# Patient Record
Sex: Female | Born: 1950
Health system: Southern US, Community
[De-identification: ages and names within clinical notes are randomized; demographics above are authoritative.]

## PROBLEM LIST (undated history)

## (undated) DIAGNOSIS — E785 Hyperlipidemia, unspecified: Secondary | ICD-10-CM

## (undated) DIAGNOSIS — E875 Hyperkalemia: Secondary | ICD-10-CM

## (undated) DIAGNOSIS — F32A Depression, unspecified: Secondary | ICD-10-CM

## (undated) DIAGNOSIS — N951 Menopausal and female climacteric states: Secondary | ICD-10-CM

## (undated) DIAGNOSIS — I5042 Chronic combined systolic (congestive) and diastolic (congestive) heart failure: Secondary | ICD-10-CM

## (undated) DIAGNOSIS — I10 Essential (primary) hypertension: Secondary | ICD-10-CM

## (undated) DIAGNOSIS — K635 Polyp of colon: Secondary | ICD-10-CM

## (undated) DIAGNOSIS — E669 Obesity, unspecified: Secondary | ICD-10-CM

## (undated) DIAGNOSIS — Z1159 Encounter for screening for other viral diseases: Secondary | ICD-10-CM

## (undated) DIAGNOSIS — E0590 Thyrotoxicosis, unspecified without thyrotoxic crisis or storm: Secondary | ICD-10-CM

## (undated) DIAGNOSIS — G4730 Sleep apnea, unspecified: Secondary | ICD-10-CM

## (undated) DIAGNOSIS — Z9981 Dependence on supplemental oxygen: Secondary | ICD-10-CM

## (undated) DIAGNOSIS — N170 Acute kidney failure with tubular necrosis: Secondary | ICD-10-CM

## (undated) DIAGNOSIS — Z4502 Encounter for adjustment and management of automatic implantable cardiac defibrillator: Secondary | ICD-10-CM

## (undated) DIAGNOSIS — R21 Rash and other nonspecific skin eruption: Secondary | ICD-10-CM

## (undated) DIAGNOSIS — I428 Other cardiomyopathies: Secondary | ICD-10-CM

## (undated) DIAGNOSIS — R748 Abnormal levels of other serum enzymes: Secondary | ICD-10-CM

## (undated) DIAGNOSIS — R0602 Shortness of breath: Secondary | ICD-10-CM

## (undated) DIAGNOSIS — E119 Type 2 diabetes mellitus without complications: Secondary | ICD-10-CM

## (undated) DIAGNOSIS — Z0000 Encounter for general adult medical examination without abnormal findings: Secondary | ICD-10-CM

## (undated) DIAGNOSIS — Z95810 Presence of automatic (implantable) cardiac defibrillator: Secondary | ICD-10-CM

## (undated) DIAGNOSIS — Z5189 Encounter for other specified aftercare: Secondary | ICD-10-CM

## (undated) DIAGNOSIS — I509 Heart failure, unspecified: Secondary | ICD-10-CM

## (undated) DIAGNOSIS — Z9889 Other specified postprocedural states: Secondary | ICD-10-CM

## (undated) DIAGNOSIS — L97909 Non-pressure chronic ulcer of unspecified part of unspecified lower leg with unspecified severity: Secondary | ICD-10-CM

## (undated) DIAGNOSIS — L309 Dermatitis, unspecified: Secondary | ICD-10-CM

## (undated) DIAGNOSIS — E1169 Type 2 diabetes mellitus with other specified complication: Secondary | ICD-10-CM

## (undated) DIAGNOSIS — J449 Chronic obstructive pulmonary disease, unspecified: Secondary | ICD-10-CM

## (undated) DIAGNOSIS — F329 Major depressive disorder, single episode, unspecified: Secondary | ICD-10-CM

## (undated) HISTORY — PX: TUBAL LIGATION: SHX77

## (undated) HISTORY — DX: Type 2 diabetes mellitus without complications: E11.9

## (undated) HISTORY — PX: CARDIAC DEFIBRILLATOR PLACEMENT: SHX171

## (undated) HISTORY — DX: Essential (primary) hypertension: I10

## (undated) HISTORY — PX: INSERT / REPLACE / REMOVE PACEMAKER: SUR710

## (undated) HISTORY — DX: Hyperlipidemia, unspecified: E78.5

## (undated) HISTORY — PX: CARDIAC CATHETERIZATION: SHX172

## (undated) HISTORY — DX: Thyrotoxicosis, unspecified without thyrotoxic crisis or storm: E05.90

## (undated) HISTORY — DX: Hyperkalemia: E87.5

## (undated) HISTORY — PX: HERNIA REPAIR: SHX51

## (undated) HISTORY — DX: Heart failure, unspecified: I50.9

## (undated) HISTORY — PX: ABDOMINAL HYSTERECTOMY: SHX81

## (undated) HISTORY — PX: HYSTEROSCOPY: SHX211

## (undated) HISTORY — DX: Other specified postprocedural states: Z98.890

## (undated) HISTORY — DX: Dermatitis, unspecified: L30.9

## (undated) HISTORY — DX: Major depressive disorder, single episode, unspecified: F32.9

## (undated) HISTORY — DX: Chronic combined systolic (congestive) and diastolic (congestive) heart failure: I50.42

## (undated) HISTORY — DX: Polyp of colon: K63.5

## (undated) HISTORY — DX: Presence of automatic (implantable) cardiac defibrillator: Z95.810

## (undated) HISTORY — DX: Depression, unspecified: F32.A

## (undated) HISTORY — DX: Menopausal and female climacteric states: N95.1

## (undated) HISTORY — DX: Sleep apnea, unspecified: G47.30

## (undated) HISTORY — DX: Rash and other nonspecific skin eruption: R21

## (undated) HISTORY — DX: Chronic obstructive pulmonary disease, unspecified: J44.9

## (undated) HISTORY — DX: Obesity, unspecified: E66.9

## (undated) HISTORY — DX: Encounter for general adult medical examination without abnormal findings: Z00.00

## (undated) HISTORY — DX: Abnormal levels of other serum enzymes: R74.8

## (undated) HISTORY — DX: Encounter for adjustment and management of automatic implantable cardiac defibrillator: Z45.02

## (undated) HISTORY — PX: TONSILLECTOMY: SUR1361

## (undated) HISTORY — PX: CATARACT EXTRACTION: SUR2

## (undated) HISTORY — DX: Type 2 diabetes mellitus with other specified complication: E11.69

## (undated) HISTORY — DX: Non-pressure chronic ulcer of unspecified part of unspecified lower leg with unspecified severity: L97.909

## (undated) HISTORY — DX: Shortness of breath: R06.02

## (undated) HISTORY — DX: Other cardiomyopathies: I42.8

## (undated) HISTORY — PX: EYE SURGERY: SHX253

## (undated) LAB — HM DIABETES EYE EXAM

---
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
## 1998-02-11 ENCOUNTER — Ambulatory Visit (HOSPITAL_COMMUNITY): Admission: RE | Admit: 1998-02-11 | Discharge: 1998-02-11 | Payer: Self-pay | Admitting: Surgery

## 1999-10-08 ENCOUNTER — Encounter: Payer: Self-pay | Admitting: Family Medicine

## 1999-10-08 ENCOUNTER — Encounter: Admission: RE | Admit: 1999-10-08 | Discharge: 1999-10-08 | Payer: Self-pay | Admitting: Family Medicine

## 2000-04-10 ENCOUNTER — Encounter (INDEPENDENT_AMBULATORY_CARE_PROVIDER_SITE_OTHER): Payer: Self-pay

## 2000-04-10 ENCOUNTER — Other Ambulatory Visit: Admission: RE | Admit: 2000-04-10 | Discharge: 2000-04-10 | Payer: Self-pay | Admitting: Obstetrics and Gynecology

## 2000-06-23 ENCOUNTER — Other Ambulatory Visit: Admission: RE | Admit: 2000-06-23 | Discharge: 2000-06-23 | Payer: Self-pay | Admitting: Obstetrics and Gynecology

## 2000-06-29 ENCOUNTER — Encounter: Payer: Self-pay | Admitting: Obstetrics and Gynecology

## 2000-07-05 ENCOUNTER — Inpatient Hospital Stay (HOSPITAL_COMMUNITY): Admission: RE | Admit: 2000-07-05 | Discharge: 2000-07-08 | Payer: Self-pay | Admitting: Obstetrics and Gynecology

## 2000-07-05 ENCOUNTER — Encounter (INDEPENDENT_AMBULATORY_CARE_PROVIDER_SITE_OTHER): Payer: Self-pay | Admitting: Specialist

## 2000-07-08 ENCOUNTER — Encounter: Payer: Self-pay | Admitting: Obstetrics and Gynecology

## 2000-11-17 ENCOUNTER — Encounter: Payer: Self-pay | Admitting: Family Medicine

## 2000-11-17 ENCOUNTER — Encounter: Admission: RE | Admit: 2000-11-17 | Discharge: 2000-11-17 | Payer: Self-pay | Admitting: Family Medicine

## 2002-07-09 ENCOUNTER — Emergency Department (HOSPITAL_COMMUNITY): Admission: EM | Admit: 2002-07-09 | Discharge: 2002-07-09 | Payer: Self-pay | Admitting: Emergency Medicine

## 2002-09-09 ENCOUNTER — Emergency Department (HOSPITAL_COMMUNITY): Admission: EM | Admit: 2002-09-09 | Discharge: 2002-09-09 | Payer: Self-pay | Admitting: Emergency Medicine

## 2002-09-17 ENCOUNTER — Encounter: Payer: Self-pay | Admitting: Cardiovascular Disease

## 2002-09-17 ENCOUNTER — Inpatient Hospital Stay (HOSPITAL_COMMUNITY): Admission: EM | Admit: 2002-09-17 | Discharge: 2002-09-19 | Payer: Self-pay | Admitting: Emergency Medicine

## 2002-09-18 ENCOUNTER — Encounter (INDEPENDENT_AMBULATORY_CARE_PROVIDER_SITE_OTHER): Payer: Self-pay | Admitting: Cardiology

## 2003-08-22 ENCOUNTER — Encounter: Admission: RE | Admit: 2003-08-22 | Discharge: 2003-11-20 | Payer: Self-pay | Admitting: Family Medicine

## 2003-10-09 ENCOUNTER — Encounter: Admission: RE | Admit: 2003-10-09 | Discharge: 2003-10-09 | Payer: Self-pay | Admitting: Family Medicine

## 2003-12-30 ENCOUNTER — Encounter (INDEPENDENT_AMBULATORY_CARE_PROVIDER_SITE_OTHER): Payer: Self-pay | Admitting: Cardiology

## 2003-12-30 ENCOUNTER — Inpatient Hospital Stay (HOSPITAL_COMMUNITY): Admission: EM | Admit: 2003-12-30 | Discharge: 2004-01-02 | Payer: Self-pay | Admitting: Emergency Medicine

## 2006-08-02 ENCOUNTER — Inpatient Hospital Stay (HOSPITAL_COMMUNITY): Admission: EM | Admit: 2006-08-02 | Discharge: 2006-08-04 | Payer: Self-pay | Admitting: Emergency Medicine

## 2006-08-28 ENCOUNTER — Ambulatory Visit: Payer: Self-pay | Admitting: Family Medicine

## 2006-09-06 ENCOUNTER — Ambulatory Visit: Payer: Self-pay | Admitting: Cardiology

## 2006-09-15 ENCOUNTER — Encounter: Payer: Self-pay | Admitting: Cardiology

## 2006-09-15 ENCOUNTER — Ambulatory Visit: Payer: Self-pay | Admitting: Cardiology

## 2006-09-15 ENCOUNTER — Ambulatory Visit: Payer: Self-pay

## 2007-02-20 ENCOUNTER — Ambulatory Visit: Payer: Self-pay | Admitting: Cardiology

## 2007-03-06 ENCOUNTER — Ambulatory Visit: Payer: Self-pay

## 2007-03-06 LAB — CONVERTED CEMR LAB
ALT: 26 units/L (ref 0–40)
AST: 23 units/L (ref 0–37)
Albumin: 3.9 g/dL (ref 3.5–5.2)
Alkaline Phosphatase: 84 units/L (ref 39–117)
BUN: 11 mg/dL (ref 6–23)
Bilirubin, Direct: 0.1 mg/dL (ref 0.0–0.3)
CO2: 31 meq/L (ref 19–32)
Calcium: 9.3 mg/dL (ref 8.4–10.5)
Chloride: 103 meq/L (ref 96–112)
Cholesterol: 211 mg/dL (ref 0–200)
Creatinine, Ser: 0.8 mg/dL (ref 0.4–1.2)
Direct LDL: 122.3 mg/dL
GFR calc Af Amer: 96 mL/min
GFR calc non Af Amer: 80 mL/min
Glucose, Bld: 218 mg/dL — ABNORMAL HIGH (ref 70–99)
HDL: 66.8 mg/dL (ref >39.0)
Potassium: 3.7 meq/L (ref 3.5–5.1)
Sodium: 140 meq/L (ref 135–145)
Total Bilirubin: 1.1 mg/dL (ref 0.3–1.2)
Total CHOL/HDL Ratio: 3.2
Total Protein: 6.9 g/dL (ref 6.0–8.3)
Triglycerides: 163 mg/dL — ABNORMAL HIGH (ref 0–149)
VLDL: 33 mg/dL (ref 0–40)

## 2007-04-23 ENCOUNTER — Ambulatory Visit: Payer: Self-pay | Admitting: Internal Medicine

## 2007-04-27 ENCOUNTER — Ambulatory Visit: Payer: Self-pay | Admitting: Internal Medicine

## 2007-04-27 LAB — CONVERTED CEMR LAB
BUN: 11 mg/dL (ref 6–23)
Basophils Absolute: 0.1 10*3/uL (ref 0.0–0.1)
Basophils Relative: 0.9 % (ref 0.0–1.0)
CO2: 30 meq/L (ref 19–32)
Calcium: 9.1 mg/dL (ref 8.4–10.5)
Chloride: 103 meq/L (ref 96–112)
Creatinine, Ser: 0.8 mg/dL (ref 0.4–1.2)
Eosinophils Absolute: 0.2 10*3/uL (ref 0.0–0.6)
Eosinophils Relative: 2 % (ref 0.0–5.0)
GFR calc Af Amer: 96 mL/min
GFR calc non Af Amer: 80 mL/min
Glucose, Bld: 249 mg/dL — ABNORMAL HIGH (ref 70–99)
HCT: 37.6 % (ref 36.0–46.0)
Hemoglobin: 12.6 g/dL (ref 12.0–15.0)
INR: 1 (ref 0.9–2.0)
Lymphocytes Relative: 30.9 % (ref 12.0–46.0)
MCHC: 33.6 g/dL (ref 30.0–36.0)
MCV: 82.3 fL (ref 78.0–100.0)
Monocytes Absolute: 0.5 10*3/uL (ref 0.2–0.7)
Monocytes Relative: 6.3 % (ref 3.0–11.0)
Neutro Abs: 4.5 10*3/uL (ref 1.4–7.7)
Neutrophils Relative %: 59.9 % (ref 43.0–77.0)
Platelets: 249 10*3/uL (ref 150–400)
Potassium: 3.6 meq/L (ref 3.5–5.1)
Prothrombin Time: 11.8 s (ref 10.0–14.0)
RBC: 4.57 M/uL (ref 3.87–5.11)
RDW: 14.9 % — ABNORMAL HIGH (ref 11.5–14.6)
Sodium: 139 meq/L (ref 135–145)
WBC: 7.7 10*3/uL (ref 4.5–10.5)
aPTT: 32.7 s (ref 26.5–36.5)

## 2007-05-04 ENCOUNTER — Ambulatory Visit: Payer: Self-pay | Admitting: Internal Medicine

## 2007-05-04 ENCOUNTER — Inpatient Hospital Stay (HOSPITAL_COMMUNITY): Admission: RE | Admit: 2007-05-04 | Discharge: 2007-05-05 | Payer: Self-pay | Admitting: Internal Medicine

## 2007-05-04 DIAGNOSIS — Z95810 Presence of automatic (implantable) cardiac defibrillator: Secondary | ICD-10-CM | POA: Insufficient documentation

## 2007-05-04 HISTORY — DX: Presence of automatic (implantable) cardiac defibrillator: Z95.810

## 2007-05-04 HISTORY — PX: CARDIAC DEFIBRILLATOR PLACEMENT: SHX171

## 2007-05-16 ENCOUNTER — Ambulatory Visit: Payer: Self-pay

## 2007-05-22 ENCOUNTER — Ambulatory Visit: Payer: Self-pay | Admitting: *Deleted

## 2007-05-22 ENCOUNTER — Encounter (INDEPENDENT_AMBULATORY_CARE_PROVIDER_SITE_OTHER): Payer: Self-pay | Admitting: Nurse Practitioner

## 2007-05-22 ENCOUNTER — Ambulatory Visit: Payer: Self-pay | Admitting: Internal Medicine

## 2007-05-22 LAB — CONVERTED CEMR LAB
Glucose, Bld: 261 mg/dL
Hgb A1c MFr Bld: 11.6 %

## 2007-05-23 ENCOUNTER — Encounter (INDEPENDENT_AMBULATORY_CARE_PROVIDER_SITE_OTHER): Payer: Self-pay | Admitting: Internal Medicine

## 2007-05-23 LAB — CONVERTED CEMR LAB
ALT: 20 units/L (ref 0–35)
AST: 16 units/L (ref 0–37)
Albumin: 4.3 g/dL (ref 3.5–5.2)
Alkaline Phosphatase: 102 units/L (ref 39–117)
BUN: 12 mg/dL (ref 6–23)
Basophils Absolute: 0 10*3/uL (ref 0.0–0.1)
Basophils Relative: 1 % (ref 0–1)
CO2: 21 meq/L (ref 19–32)
Calcium: 8.9 mg/dL (ref 8.4–10.5)
Chloride: 104 meq/L (ref 96–112)
Creatinine, Ser: 0.7 mg/dL (ref 0.40–1.20)
Eosinophils Absolute: 0.2 10*3/uL (ref 0.0–0.7)
Eosinophils Relative: 3 % (ref 0–5)
Glucose, Bld: 242 mg/dL — ABNORMAL HIGH (ref 70–99)
HCT: 42.2 % (ref 36.0–46.0)
Hemoglobin: 13.4 g/dL (ref 12.0–15.0)
Lymphocytes Relative: 31 % (ref 12–46)
Lymphs Abs: 2.2 10*3/uL (ref 0.7–3.3)
MCHC: 31.8 g/dL (ref 30.0–36.0)
MCV: 83.7 fL (ref 78.0–100.0)
Monocytes Absolute: 0.4 10*3/uL (ref 0.2–0.7)
Monocytes Relative: 6 % (ref 3–11)
Neutro Abs: 4.2 10*3/uL (ref 1.7–7.7)
Neutrophils Relative %: 59 % (ref 43–77)
Platelets: 196 10*3/uL (ref 150–400)
Potassium: 4.2 meq/L (ref 3.5–5.3)
RBC / HPF: NONE SEEN (ref <3)
RBC: 5.04 M/uL (ref 3.87–5.11)
RDW: 15.6 % — ABNORMAL HIGH (ref 11.5–14.0)
Sodium: 140 meq/L (ref 135–145)
TSH: 1.061 microintl units/mL (ref 0.350–5.50)
Total Bilirubin: 0.6 mg/dL (ref 0.3–1.2)
Total Protein: 6.7 g/dL (ref 6.0–8.3)
WBC, UA: NONE SEEN cells/hpf (ref <3)
WBC: 7.2 10*3/uL (ref 4.0–10.5)

## 2007-05-25 ENCOUNTER — Encounter: Payer: Self-pay | Admitting: Nurse Practitioner

## 2007-05-25 DIAGNOSIS — E1169 Type 2 diabetes mellitus with other specified complication: Secondary | ICD-10-CM

## 2007-05-25 DIAGNOSIS — E785 Hyperlipidemia, unspecified: Secondary | ICD-10-CM | POA: Insufficient documentation

## 2007-05-25 DIAGNOSIS — L97909 Non-pressure chronic ulcer of unspecified part of unspecified lower leg with unspecified severity: Secondary | ICD-10-CM | POA: Insufficient documentation

## 2007-05-25 DIAGNOSIS — I10 Essential (primary) hypertension: Secondary | ICD-10-CM | POA: Insufficient documentation

## 2007-05-25 DIAGNOSIS — I5042 Chronic combined systolic (congestive) and diastolic (congestive) heart failure: Secondary | ICD-10-CM | POA: Insufficient documentation

## 2007-05-25 HISTORY — DX: Type 2 diabetes mellitus with other specified complication: E11.69

## 2007-05-25 HISTORY — DX: Type 2 diabetes mellitus with other specified complication: E78.5

## 2007-07-10 ENCOUNTER — Encounter (INDEPENDENT_AMBULATORY_CARE_PROVIDER_SITE_OTHER): Payer: Self-pay | Admitting: Internal Medicine

## 2007-07-23 ENCOUNTER — Telehealth (INDEPENDENT_AMBULATORY_CARE_PROVIDER_SITE_OTHER): Payer: Self-pay | Admitting: *Deleted

## 2007-08-01 ENCOUNTER — Ambulatory Visit: Payer: Self-pay | Admitting: Nurse Practitioner

## 2007-08-01 DIAGNOSIS — D1720 Benign lipomatous neoplasm of skin and subcutaneous tissue of unspecified limb: Secondary | ICD-10-CM | POA: Insufficient documentation

## 2007-08-01 LAB — CONVERTED CEMR LAB: Blood Glucose, Fingerstick: 380

## 2007-08-08 ENCOUNTER — Encounter (INDEPENDENT_AMBULATORY_CARE_PROVIDER_SITE_OTHER): Payer: Self-pay | Admitting: Nurse Practitioner

## 2007-08-09 ENCOUNTER — Encounter (INDEPENDENT_AMBULATORY_CARE_PROVIDER_SITE_OTHER): Payer: Self-pay | Admitting: Nurse Practitioner

## 2007-08-09 ENCOUNTER — Ambulatory Visit: Payer: Self-pay | Admitting: Cardiology

## 2007-08-09 LAB — CONVERTED CEMR LAB
ALT: 23 units/L (ref 0–35)
AST: 23 units/L (ref 0–37)
Albumin: 4 g/dL (ref 3.5–5.2)
Alkaline Phosphatase: 93 units/L (ref 39–117)
BUN: 9 mg/dL (ref 6–23)
Bilirubin, Direct: 0.1 mg/dL (ref 0.0–0.3)
CO2: 29 meq/L (ref 19–32)
Calcium: 9.3 mg/dL (ref 8.4–10.5)
Chloride: 103 meq/L (ref 96–112)
Cholesterol: 196 mg/dL (ref 0–200)
Creatinine, Ser: 1 mg/dL (ref 0.4–1.2)
GFR calc Af Amer: 75 mL/min
GFR calc non Af Amer: 62 mL/min
Glucose, Bld: 234 mg/dL — ABNORMAL HIGH (ref 70–99)
HDL: 70.4 mg/dL (ref >39.0)
LDL Cholesterol: 95 mg/dL (ref 0–99)
Potassium: 3.8 meq/L (ref 3.5–5.1)
Sodium: 142 meq/L (ref 135–145)
Total Bilirubin: 0.9 mg/dL (ref 0.3–1.2)
Total CHOL/HDL Ratio: 2.8
Total Protein: 6.8 g/dL (ref 6.0–8.3)
Triglycerides: 151 mg/dL — ABNORMAL HIGH (ref 0–149)
VLDL: 30 mg/dL (ref 0–40)

## 2007-08-21 ENCOUNTER — Ambulatory Visit: Payer: Self-pay | Admitting: Internal Medicine

## 2007-08-23 ENCOUNTER — Ambulatory Visit: Payer: Self-pay | Admitting: Nurse Practitioner

## 2007-08-23 LAB — CONVERTED CEMR LAB: Blood Glucose, Fingerstick: 172

## 2007-09-04 ENCOUNTER — Ambulatory Visit: Payer: Self-pay | Admitting: Nurse Practitioner

## 2007-09-21 ENCOUNTER — Ambulatory Visit: Payer: Self-pay | Admitting: Nurse Practitioner

## 2007-09-21 LAB — CONVERTED CEMR LAB: Blood Glucose, Fingerstick: 184

## 2007-10-15 ENCOUNTER — Encounter (INDEPENDENT_AMBULATORY_CARE_PROVIDER_SITE_OTHER): Payer: Self-pay | Admitting: Nurse Practitioner

## 2007-10-19 ENCOUNTER — Ambulatory Visit: Payer: Self-pay | Admitting: Nurse Practitioner

## 2007-10-25 ENCOUNTER — Ambulatory Visit: Payer: Self-pay | Admitting: Nurse Practitioner

## 2007-10-25 DIAGNOSIS — F3289 Other specified depressive episodes: Secondary | ICD-10-CM | POA: Insufficient documentation

## 2007-10-25 DIAGNOSIS — F329 Major depressive disorder, single episode, unspecified: Secondary | ICD-10-CM | POA: Insufficient documentation

## 2007-10-25 LAB — CONVERTED CEMR LAB
Blood Glucose, Fingerstick: 183
Cholesterol, target level: 200 mg/dL
HDL goal, serum: 40 mg/dL
LDL Goal: 100 mg/dL

## 2007-11-05 ENCOUNTER — Encounter (INDEPENDENT_AMBULATORY_CARE_PROVIDER_SITE_OTHER): Payer: Self-pay | Admitting: Nurse Practitioner

## 2007-11-06 ENCOUNTER — Encounter (INDEPENDENT_AMBULATORY_CARE_PROVIDER_SITE_OTHER): Payer: Self-pay | Admitting: Nurse Practitioner

## 2007-11-15 ENCOUNTER — Ambulatory Visit: Payer: Self-pay

## 2007-11-26 ENCOUNTER — Ambulatory Visit: Payer: Self-pay | Admitting: Nurse Practitioner

## 2007-11-26 DIAGNOSIS — L259 Unspecified contact dermatitis, unspecified cause: Secondary | ICD-10-CM | POA: Insufficient documentation

## 2007-12-12 ENCOUNTER — Ambulatory Visit (HOSPITAL_COMMUNITY): Admission: RE | Admit: 2007-12-12 | Discharge: 2007-12-12 | Payer: Self-pay | Admitting: Surgery

## 2007-12-12 ENCOUNTER — Encounter (INDEPENDENT_AMBULATORY_CARE_PROVIDER_SITE_OTHER): Payer: Self-pay | Admitting: Surgery

## 2007-12-12 ENCOUNTER — Encounter (INDEPENDENT_AMBULATORY_CARE_PROVIDER_SITE_OTHER): Payer: Self-pay | Admitting: Nurse Practitioner

## 2007-12-25 ENCOUNTER — Encounter (INDEPENDENT_AMBULATORY_CARE_PROVIDER_SITE_OTHER): Payer: Self-pay | Admitting: Nurse Practitioner

## 2008-01-03 ENCOUNTER — Encounter (INDEPENDENT_AMBULATORY_CARE_PROVIDER_SITE_OTHER): Payer: Self-pay | Admitting: Nurse Practitioner

## 2008-01-23 ENCOUNTER — Ambulatory Visit: Payer: Self-pay | Admitting: Nurse Practitioner

## 2008-01-23 DIAGNOSIS — M79609 Pain in unspecified limb: Secondary | ICD-10-CM | POA: Insufficient documentation

## 2008-01-23 LAB — CONVERTED CEMR LAB
BUN: 19 mg/dL (ref 6–23)
Blood Glucose, Fingerstick: 217
CO2: 27 meq/L (ref 19–32)
Calcium: 9.8 mg/dL (ref 8.4–10.5)
Chloride: 101 meq/L (ref 96–112)
Creatinine, Ser: 0.92 mg/dL (ref 0.40–1.20)
Glucose, Bld: 202 mg/dL — ABNORMAL HIGH (ref 70–99)
Potassium: 4.4 meq/L (ref 3.5–5.3)
Sodium: 147 meq/L — ABNORMAL HIGH (ref 135–145)

## 2008-01-24 ENCOUNTER — Encounter (INDEPENDENT_AMBULATORY_CARE_PROVIDER_SITE_OTHER): Payer: Self-pay | Admitting: Nurse Practitioner

## 2008-02-11 ENCOUNTER — Ambulatory Visit: Payer: Self-pay | Admitting: Nurse Practitioner

## 2008-02-11 DIAGNOSIS — Z6841 Body Mass Index (BMI) 40.0 and over, adult: Secondary | ICD-10-CM | POA: Insufficient documentation

## 2008-02-11 LAB — CONVERTED CEMR LAB
ALT: 19 units/L (ref 0–35)
AST: 12 units/L (ref 0–37)
Albumin: 4.6 g/dL (ref 3.5–5.2)
Alkaline Phosphatase: 107 units/L (ref 39–117)
BUN: 14 mg/dL (ref 6–23)
Basophils Absolute: 0 10*3/uL (ref 0.0–0.1)
Basophils Relative: 0 % (ref 0–1)
Bilirubin Urine: NEGATIVE
Blood Glucose, Fingerstick: 174
Blood in Urine, dipstick: NEGATIVE
CO2: 28 meq/L (ref 19–32)
Calcium: 9.1 mg/dL (ref 8.4–10.5)
Chloride: 103 meq/L (ref 96–112)
Cholesterol: 158 mg/dL (ref 0–200)
Creatinine, Ser: 0.69 mg/dL (ref 0.40–1.20)
Eosinophils Absolute: 0.6 10*3/uL (ref 0.0–0.7)
Eosinophils Relative: 7 % — ABNORMAL HIGH (ref 0–5)
Glucose, Bld: 191 mg/dL — ABNORMAL HIGH (ref 70–99)
Glucose, Urine, Semiquant: NEGATIVE
HCT: 36.9 % (ref 36.0–46.0)
HDL: 68 mg/dL (ref >39)
Hemoglobin: 11.1 g/dL — ABNORMAL LOW (ref 12.0–15.0)
Hgb A1c MFr Bld: 8.5 %
Ketones, urine, test strip: NEGATIVE
LDL Cholesterol: 77 mg/dL (ref 0–99)
Lymphocytes Relative: 29 % (ref 12–46)
Lymphs Abs: 2.4 10*3/uL (ref 0.7–4.0)
MCHC: 30.1 g/dL (ref 30.0–36.0)
MCV: 84.2 fL (ref 78.0–100.0)
Microalb, Ur: 0.42 mg/dL (ref 0.00–1.89)
Monocytes Absolute: 0.4 10*3/uL (ref 0.1–1.0)
Monocytes Relative: 5 % (ref 3–12)
Neutro Abs: 5.1 10*3/uL (ref 1.7–7.7)
Neutrophils Relative %: 60 % (ref 43–77)
Nitrite: NEGATIVE
Platelets: 295 10*3/uL (ref 150–400)
Potassium: 4.6 meq/L (ref 3.5–5.3)
RBC: 4.38 M/uL (ref 3.87–5.11)
RDW: 15.9 % — ABNORMAL HIGH (ref 11.5–15.5)
Sodium: 142 meq/L (ref 135–145)
Specific Gravity, Urine: 1.015
Total Bilirubin: 0.5 mg/dL (ref 0.3–1.2)
Total CHOL/HDL Ratio: 2.3
Total Protein: 6.8 g/dL (ref 6.0–8.3)
Triglycerides: 64 mg/dL (ref <150)
Urobilinogen, UA: 1
VLDL: 13 mg/dL (ref 0–40)
WBC Urine, dipstick: NEGATIVE
WBC: 8.5 10*3/uL (ref 4.0–10.5)
pH: 6

## 2008-02-14 ENCOUNTER — Ambulatory Visit: Payer: Self-pay

## 2008-02-22 ENCOUNTER — Ambulatory Visit: Payer: Self-pay | Admitting: Cardiology

## 2008-02-27 ENCOUNTER — Telehealth (INDEPENDENT_AMBULATORY_CARE_PROVIDER_SITE_OTHER): Payer: Self-pay | Admitting: Nurse Practitioner

## 2008-02-28 ENCOUNTER — Telehealth (INDEPENDENT_AMBULATORY_CARE_PROVIDER_SITE_OTHER): Payer: Self-pay | Admitting: *Deleted

## 2008-02-28 ENCOUNTER — Telehealth (INDEPENDENT_AMBULATORY_CARE_PROVIDER_SITE_OTHER): Payer: Self-pay | Admitting: Nurse Practitioner

## 2008-03-24 ENCOUNTER — Ambulatory Visit: Payer: Self-pay | Admitting: Nurse Practitioner

## 2008-03-24 LAB — CONVERTED CEMR LAB: Blood Glucose, Fingerstick: 233

## 2008-04-02 ENCOUNTER — Ambulatory Visit: Payer: Self-pay | Admitting: Internal Medicine

## 2008-04-11 ENCOUNTER — Telehealth (INDEPENDENT_AMBULATORY_CARE_PROVIDER_SITE_OTHER): Payer: Self-pay | Admitting: Nurse Practitioner

## 2008-06-20 ENCOUNTER — Ambulatory Visit: Payer: Self-pay | Admitting: Nurse Practitioner

## 2008-06-20 LAB — CONVERTED CEMR LAB
Blood Glucose, Fingerstick: 172
Hgb A1c MFr Bld: 11.4 %

## 2008-07-09 ENCOUNTER — Ambulatory Visit: Payer: Self-pay

## 2008-08-20 ENCOUNTER — Ambulatory Visit: Payer: Self-pay | Admitting: Nurse Practitioner

## 2008-08-20 DIAGNOSIS — G4730 Sleep apnea, unspecified: Secondary | ICD-10-CM | POA: Insufficient documentation

## 2008-08-20 DIAGNOSIS — R5381 Other malaise: Secondary | ICD-10-CM | POA: Insufficient documentation

## 2008-08-20 DIAGNOSIS — R5383 Other fatigue: Secondary | ICD-10-CM | POA: Insufficient documentation

## 2008-08-20 LAB — CONVERTED CEMR LAB
ALT: 20 units/L (ref 0–35)
AST: 14 units/L (ref 0–37)
Albumin: 4.5 g/dL (ref 3.5–5.2)
Alkaline Phosphatase: 110 units/L (ref 39–117)
BUN: 10 mg/dL (ref 6–23)
Basophils Absolute: 0 10*3/uL (ref 0.0–0.1)
Basophils Relative: 0 % (ref 0–1)
Blood Glucose, Fingerstick: 118
CO2: 25 meq/L (ref 19–32)
Calcium: 9.2 mg/dL (ref 8.4–10.5)
Chloride: 107 meq/L (ref 96–112)
Cholesterol: 150 mg/dL (ref 0–200)
Creatinine, Ser: 0.72 mg/dL (ref 0.40–1.20)
Eosinophils Absolute: 0.4 10*3/uL (ref 0.0–0.7)
Eosinophils Relative: 5 % (ref 0–5)
Glucose, Bld: 98 mg/dL (ref 70–99)
HCT: 41.3 % (ref 36.0–46.0)
HDL: 58 mg/dL (ref >39)
Hemoglobin: 12.4 g/dL (ref 12.0–15.0)
Hgb A1c MFr Bld: 10.1 %
LDL Cholesterol: 80 mg/dL (ref 0–99)
Lymphocytes Relative: 31 % (ref 12–46)
Lymphs Abs: 2.7 10*3/uL (ref 0.7–4.0)
MCHC: 30 g/dL (ref 30.0–36.0)
MCV: 85.5 fL (ref 78.0–100.0)
Monocytes Absolute: 0.4 10*3/uL (ref 0.1–1.0)
Monocytes Relative: 5 % (ref 3–12)
Neutro Abs: 5 10*3/uL (ref 1.7–7.7)
Neutrophils Relative %: 59 % (ref 43–77)
Platelets: 272 10*3/uL (ref 150–400)
Potassium: 5 meq/L (ref 3.5–5.3)
RBC: 4.83 M/uL (ref 3.87–5.11)
RDW: 15.6 % — ABNORMAL HIGH (ref 11.5–15.5)
Sodium: 145 meq/L (ref 135–145)
Total Bilirubin: 0.3 mg/dL (ref 0.3–1.2)
Total CHOL/HDL Ratio: 2.6
Total Protein: 7.2 g/dL (ref 6.0–8.3)
Triglycerides: 59 mg/dL (ref <150)
VLDL: 12 mg/dL (ref 0–40)
WBC: 8.6 10*3/uL (ref 4.0–10.5)

## 2008-08-21 ENCOUNTER — Encounter (INDEPENDENT_AMBULATORY_CARE_PROVIDER_SITE_OTHER): Payer: Self-pay | Admitting: Nurse Practitioner

## 2008-10-05 ENCOUNTER — Encounter (INDEPENDENT_AMBULATORY_CARE_PROVIDER_SITE_OTHER): Payer: Self-pay | Admitting: Nurse Practitioner

## 2008-10-05 ENCOUNTER — Ambulatory Visit (HOSPITAL_BASED_OUTPATIENT_CLINIC_OR_DEPARTMENT_OTHER): Admission: RE | Admit: 2008-10-05 | Discharge: 2008-10-05 | Payer: Self-pay | Admitting: Nurse Practitioner

## 2008-10-12 ENCOUNTER — Ambulatory Visit: Payer: Self-pay | Admitting: Internal Medicine

## 2008-10-21 ENCOUNTER — Encounter (INDEPENDENT_AMBULATORY_CARE_PROVIDER_SITE_OTHER): Payer: Self-pay | Admitting: Nurse Practitioner

## 2008-10-27 ENCOUNTER — Ambulatory Visit: Payer: Self-pay | Admitting: Nurse Practitioner

## 2008-10-27 LAB — CONVERTED CEMR LAB
Blood Glucose, Fingerstick: 195
Hgb A1c MFr Bld: 10.5 %

## 2008-11-06 ENCOUNTER — Encounter: Payer: Self-pay | Admitting: Internal Medicine

## 2008-11-13 ENCOUNTER — Ambulatory Visit: Payer: Self-pay | Admitting: Cardiology

## 2008-12-11 ENCOUNTER — Ambulatory Visit: Payer: Self-pay | Admitting: Cardiology

## 2008-12-25 ENCOUNTER — Ambulatory Visit: Payer: Self-pay | Admitting: Nurse Practitioner

## 2008-12-25 LAB — CONVERTED CEMR LAB
Blood Glucose, AC Bkfst: 85 mg/dL
Hgb A1c MFr Bld: 9.5 %

## 2009-01-14 ENCOUNTER — Ambulatory Visit: Payer: Self-pay | Admitting: Nurse Practitioner

## 2009-01-14 DIAGNOSIS — H60339 Swimmer's ear, unspecified ear: Secondary | ICD-10-CM | POA: Insufficient documentation

## 2009-01-14 LAB — CONVERTED CEMR LAB: Blood Glucose, AC Bkfst: 158 mg/dL

## 2009-02-24 ENCOUNTER — Emergency Department (HOSPITAL_COMMUNITY): Admission: EM | Admit: 2009-02-24 | Discharge: 2009-02-24 | Payer: Self-pay | Admitting: Emergency Medicine

## 2009-02-24 LAB — CONVERTED CEMR LAB
BUN: 17 mg/dL (ref 6–23)
CO2: 32 meq/L (ref 19–32)
Calcium: 9.3 mg/dL (ref 8.4–10.5)
Chloride: 106 meq/L (ref 96–112)
Creatinine, Ser: 0.8 mg/dL (ref 0.4–1.2)
GFR calc Af Amer: 96 mL/min
GFR calc non Af Amer: 79 mL/min
Glucose, Bld: 143 mg/dL — ABNORMAL HIGH (ref 70–99)
Potassium: 4.1 meq/L (ref 3.5–5.1)
Sodium: 143 meq/L (ref 135–145)

## 2009-03-12 ENCOUNTER — Ambulatory Visit: Payer: Self-pay | Admitting: Nurse Practitioner

## 2009-03-12 LAB — CONVERTED CEMR LAB
Blood Glucose, Fingerstick: 138
Hgb A1c MFr Bld: 11.8 %

## 2009-03-26 ENCOUNTER — Ambulatory Visit: Payer: Self-pay | Admitting: Nurse Practitioner

## 2009-04-27 ENCOUNTER — Ambulatory Visit: Payer: Self-pay | Admitting: Nurse Practitioner

## 2009-04-27 DIAGNOSIS — Z9889 Other specified postprocedural states: Secondary | ICD-10-CM | POA: Insufficient documentation

## 2009-04-27 DIAGNOSIS — N959 Unspecified menopausal and perimenopausal disorder: Secondary | ICD-10-CM | POA: Insufficient documentation

## 2009-04-27 LAB — CONVERTED CEMR LAB
Bilirubin Urine: NEGATIVE
Blood Glucose, Fingerstick: 149
Blood in Urine, dipstick: NEGATIVE
Glucose, Urine, Semiquant: NEGATIVE
Hgb A1c MFr Bld: 10 %
Ketones, urine, test strip: NEGATIVE
Nitrite: NEGATIVE
Protein, U semiquant: NEGATIVE
Specific Gravity, Urine: 1.015
Urobilinogen, UA: 0.2
WBC Urine, dipstick: NEGATIVE
pH: 5

## 2009-05-01 ENCOUNTER — Encounter (INDEPENDENT_AMBULATORY_CARE_PROVIDER_SITE_OTHER): Payer: Self-pay | Admitting: Nurse Practitioner

## 2009-05-04 ENCOUNTER — Encounter (INDEPENDENT_AMBULATORY_CARE_PROVIDER_SITE_OTHER): Payer: Self-pay | Admitting: Nurse Practitioner

## 2009-05-04 ENCOUNTER — Ambulatory Visit (HOSPITAL_COMMUNITY): Admission: RE | Admit: 2009-05-04 | Discharge: 2009-05-04 | Payer: Self-pay | Admitting: Internal Medicine

## 2009-05-06 ENCOUNTER — Encounter (INDEPENDENT_AMBULATORY_CARE_PROVIDER_SITE_OTHER): Payer: Self-pay | Admitting: Nurse Practitioner

## 2009-05-06 DIAGNOSIS — E0590 Thyrotoxicosis, unspecified without thyrotoxic crisis or storm: Secondary | ICD-10-CM

## 2009-05-06 HISTORY — DX: Thyrotoxicosis, unspecified without thyrotoxic crisis or storm: E05.90

## 2009-05-10 ENCOUNTER — Encounter (INDEPENDENT_AMBULATORY_CARE_PROVIDER_SITE_OTHER): Payer: Self-pay | Admitting: Nurse Practitioner

## 2009-05-13 ENCOUNTER — Ambulatory Visit: Payer: Self-pay | Admitting: Internal Medicine

## 2009-07-29 ENCOUNTER — Ambulatory Visit: Payer: Self-pay | Admitting: Nurse Practitioner

## 2009-07-29 LAB — CONVERTED CEMR LAB
ALT: 21 units/L (ref 0–35)
AST: 20 units/L (ref 0–37)
Albumin: 4.3 g/dL (ref 3.5–5.2)
Alkaline Phosphatase: 98 units/L (ref 39–117)
BUN: 14 mg/dL (ref 6–23)
Basophils Absolute: 0 10*3/uL (ref 0.0–0.1)
Basophils Relative: 0 % (ref 0–1)
Blood Glucose, Fingerstick: 72
CO2: 27 meq/L (ref 19–32)
Calcium: 9.5 mg/dL (ref 8.4–10.5)
Chloride: 104 meq/L (ref 96–112)
Cholesterol: 160 mg/dL (ref 0–200)
Creatinine, Ser: 0.8 mg/dL (ref 0.40–1.20)
Eosinophils Absolute: 0.1 10*3/uL (ref 0.0–0.7)
Eosinophils Relative: 1 % (ref 0–5)
Glucose, Bld: 64 mg/dL — ABNORMAL LOW (ref 70–99)
HCT: 42.6 % (ref 36.0–46.0)
HDL: 67 mg/dL (ref >39)
Hemoglobin: 13.3 g/dL (ref 12.0–15.0)
LDL Cholesterol: 78 mg/dL (ref 0–99)
Lymphocytes Relative: 24 % (ref 12–46)
Lymphs Abs: 2.2 10*3/uL (ref 0.7–4.0)
MCHC: 31.2 g/dL (ref 30.0–36.0)
MCV: 85.9 fL (ref 78.0–100.0)
Microalb, Ur: 1.08 mg/dL (ref 0.00–1.89)
Monocytes Absolute: 0.6 10*3/uL (ref 0.1–1.0)
Monocytes Relative: 7 % (ref 3–12)
Neutro Abs: 6.1 10*3/uL (ref 1.7–7.7)
Neutrophils Relative %: 68 % (ref 43–77)
Platelets: 264 10*3/uL (ref 150–400)
Potassium: 4.2 meq/L (ref 3.5–5.3)
RBC: 4.96 M/uL (ref 3.87–5.11)
RDW: 16 % — ABNORMAL HIGH (ref 11.5–15.5)
Sodium: 145 meq/L (ref 135–145)
TSH: 0.999 microintl units/mL (ref 0.350–4.500)
Total Bilirubin: 0.4 mg/dL (ref 0.3–1.2)
Total CHOL/HDL Ratio: 2.4
Total Protein: 7.1 g/dL (ref 6.0–8.3)
Triglycerides: 74 mg/dL (ref <150)
VLDL: 15 mg/dL (ref 0–40)
WBC: 9 10*3/uL (ref 4.0–10.5)

## 2009-07-30 ENCOUNTER — Encounter (INDEPENDENT_AMBULATORY_CARE_PROVIDER_SITE_OTHER): Payer: Self-pay | Admitting: Nurse Practitioner

## 2009-08-14 ENCOUNTER — Ambulatory Visit: Payer: Self-pay | Admitting: Internal Medicine

## 2009-10-01 ENCOUNTER — Encounter (INDEPENDENT_AMBULATORY_CARE_PROVIDER_SITE_OTHER): Payer: Self-pay | Admitting: Nurse Practitioner

## 2009-10-04 ENCOUNTER — Emergency Department (HOSPITAL_COMMUNITY): Admission: EM | Admit: 2009-10-04 | Discharge: 2009-10-04 | Payer: Self-pay | Admitting: Emergency Medicine

## 2009-10-04 ENCOUNTER — Encounter: Payer: Self-pay | Admitting: Internal Medicine

## 2009-10-04 LAB — CONVERTED CEMR LAB
BUN: 9 mg/dL
Chloride: 104 meq/L
Creatinine, Ser: 0.7 mg/dL
Glucose, Bld: 219 mg/dL
Potassium: 3.3 meq/L
Sodium: 142 meq/L

## 2009-10-06 ENCOUNTER — Ambulatory Visit: Payer: Self-pay | Admitting: Nurse Practitioner

## 2009-10-06 LAB — CONVERTED CEMR LAB
Blood Glucose, Fingerstick: 218
HCT: 39.3 %
Hemoglobin: 13 g/dL
MCHC: 33 g/dL
MCV: 82.4 fL
Platelets: 219 10*3/uL
RBC: 4.77 M/uL
RDW: 15 %
WBC: 9.1 10*3/uL

## 2009-10-16 ENCOUNTER — Ambulatory Visit (HOSPITAL_COMMUNITY): Admission: RE | Admit: 2009-10-16 | Discharge: 2009-10-16 | Payer: Self-pay | Admitting: Internal Medicine

## 2009-10-16 ENCOUNTER — Encounter (INDEPENDENT_AMBULATORY_CARE_PROVIDER_SITE_OTHER): Payer: Self-pay | Admitting: Nurse Practitioner

## 2009-10-16 DIAGNOSIS — J449 Chronic obstructive pulmonary disease, unspecified: Secondary | ICD-10-CM | POA: Insufficient documentation

## 2009-10-16 LAB — PULMONARY FUNCTION TEST

## 2009-10-26 ENCOUNTER — Ambulatory Visit: Payer: Self-pay | Admitting: Nurse Practitioner

## 2009-10-26 DIAGNOSIS — F17200 Nicotine dependence, unspecified, uncomplicated: Secondary | ICD-10-CM | POA: Insufficient documentation

## 2009-10-26 LAB — CONVERTED CEMR LAB
Blood Glucose, Fingerstick: 255
Hgb A1c MFr Bld: 9.6 %

## 2009-11-09 ENCOUNTER — Encounter: Payer: Self-pay | Admitting: Internal Medicine

## 2009-11-09 ENCOUNTER — Ambulatory Visit: Payer: Self-pay

## 2010-01-12 ENCOUNTER — Telehealth (INDEPENDENT_AMBULATORY_CARE_PROVIDER_SITE_OTHER): Payer: Self-pay | Admitting: Nurse Practitioner

## 2010-01-13 ENCOUNTER — Ambulatory Visit: Payer: Self-pay | Admitting: Nurse Practitioner

## 2010-01-13 DIAGNOSIS — L659 Nonscarring hair loss, unspecified: Secondary | ICD-10-CM | POA: Insufficient documentation

## 2010-01-13 LAB — CONVERTED CEMR LAB: Blood Glucose, Fingerstick: 205

## 2010-01-15 ENCOUNTER — Ambulatory Visit: Payer: Self-pay | Admitting: Nurse Practitioner

## 2010-01-15 LAB — CONVERTED CEMR LAB: Blood Glucose, AC Bkfst: 140 mg/dL

## 2010-02-03 ENCOUNTER — Ambulatory Visit: Payer: Self-pay

## 2010-02-03 ENCOUNTER — Encounter: Payer: Self-pay | Admitting: Internal Medicine

## 2010-02-04 ENCOUNTER — Ambulatory Visit: Payer: Self-pay | Admitting: Nurse Practitioner

## 2010-02-04 LAB — CONVERTED CEMR LAB
Blood Glucose, Fingerstick: 203
Hgb A1c MFr Bld: 12.5 %

## 2010-05-07 ENCOUNTER — Ambulatory Visit: Payer: Self-pay | Admitting: Nurse Practitioner

## 2010-05-07 LAB — CONVERTED CEMR LAB
Blood Glucose, Fingerstick: 209
Hgb A1c MFr Bld: 10.1 %

## 2010-05-10 ENCOUNTER — Encounter: Payer: Self-pay | Admitting: Internal Medicine

## 2010-05-10 ENCOUNTER — Ambulatory Visit: Payer: Self-pay

## 2010-05-10 LAB — CONVERTED CEMR LAB
ALT: 19 units/L (ref 0–35)
AST: 20 units/L (ref 0–37)
Albumin: 4.3 g/dL (ref 3.5–5.2)
Alkaline Phosphatase: 105 units/L (ref 39–117)
BUN: 18 mg/dL (ref 6–23)
Basophils Absolute: 0.1 10*3/uL (ref 0.0–0.1)
Basophils Relative: 1 % (ref 0–1)
CO2: 23 meq/L (ref 19–32)
Calcium: 9.5 mg/dL (ref 8.4–10.5)
Chloride: 101 meq/L (ref 96–112)
Cholesterol: 236 mg/dL — ABNORMAL HIGH (ref 0–200)
Creatinine, Ser: 1.22 mg/dL — ABNORMAL HIGH (ref 0.40–1.20)
Eosinophils Absolute: 0.4 10*3/uL (ref 0.0–0.7)
Eosinophils Relative: 3 % (ref 0–5)
Glucose, Bld: 210 mg/dL — ABNORMAL HIGH (ref 70–99)
HCT: 41.4 % (ref 36.0–46.0)
HDL: 67 mg/dL (ref >39)
Hemoglobin: 13.5 g/dL (ref 12.0–15.0)
LDL Cholesterol: 135 mg/dL — ABNORMAL HIGH (ref 0–99)
Lymphocytes Relative: 26 % (ref 12–46)
Lymphs Abs: 3.4 10*3/uL (ref 0.7–4.0)
MCHC: 32.6 g/dL (ref 30.0–36.0)
MCV: 82.1 fL (ref 78.0–100.0)
Monocytes Absolute: 0.8 10*3/uL (ref 0.1–1.0)
Monocytes Relative: 6 % (ref 3–12)
Neutro Abs: 8.4 10*3/uL — ABNORMAL HIGH (ref 1.7–7.7)
Neutrophils Relative %: 65 % (ref 43–77)
Platelets: 298 10*3/uL (ref 150–400)
Potassium: 4.8 meq/L (ref 3.5–5.3)
RBC: 5.04 M/uL (ref 3.87–5.11)
RDW: 17.6 % — ABNORMAL HIGH (ref 11.5–15.5)
Sodium: 140 meq/L (ref 135–145)
TSH: 1.472 microintl units/mL (ref 0.350–4.500)
Total Bilirubin: 0.6 mg/dL (ref 0.3–1.2)
Total CHOL/HDL Ratio: 3.5
Total Protein: 7.1 g/dL (ref 6.0–8.3)
Triglycerides: 171 mg/dL — ABNORMAL HIGH (ref <150)
VLDL: 34 mg/dL (ref 0–40)
WBC: 12.9 10*3/uL — ABNORMAL HIGH (ref 4.0–10.5)

## 2010-05-19 ENCOUNTER — Ambulatory Visit (HOSPITAL_COMMUNITY): Admission: RE | Admit: 2010-05-19 | Discharge: 2010-05-19 | Payer: Self-pay | Admitting: Internal Medicine

## 2010-06-08 ENCOUNTER — Ambulatory Visit: Payer: Self-pay | Admitting: Nurse Practitioner

## 2010-06-08 LAB — CONVERTED CEMR LAB
Basophils Absolute: 0 10*3/uL (ref 0.0–0.1)
Basophils Relative: 0 % (ref 0–1)
Eosinophils Absolute: 0.3 10*3/uL (ref 0.0–0.7)
Eosinophils Relative: 3 % (ref 0–5)
HCT: 40.9 % (ref 36.0–46.0)
Hemoglobin: 13 g/dL (ref 12.0–15.0)
Lymphocytes Relative: 28 % (ref 12–46)
Lymphs Abs: 2.5 10*3/uL (ref 0.7–4.0)
MCHC: 31.8 g/dL (ref 30.0–36.0)
MCV: 85.4 fL (ref 78.0–100.0)
Monocytes Absolute: 0.6 10*3/uL (ref 0.1–1.0)
Monocytes Relative: 6 % (ref 3–12)
Neutro Abs: 5.7 10*3/uL (ref 1.7–7.7)
Neutrophils Relative %: 62 % (ref 43–77)
Platelets: 282 10*3/uL (ref 150–400)
RBC: 4.79 M/uL (ref 3.87–5.11)
RDW: 16.3 % — ABNORMAL HIGH (ref 11.5–15.5)
WBC: 9.1 10*3/uL (ref 4.0–10.5)

## 2010-06-09 ENCOUNTER — Encounter (INDEPENDENT_AMBULATORY_CARE_PROVIDER_SITE_OTHER): Payer: Self-pay | Admitting: Nurse Practitioner

## 2010-08-06 ENCOUNTER — Ambulatory Visit: Payer: Self-pay | Admitting: Nurse Practitioner

## 2010-08-06 LAB — CONVERTED CEMR LAB: Hgb A1c MFr Bld: 9.3 %

## 2010-08-10 ENCOUNTER — Ambulatory Visit: Payer: Self-pay | Admitting: Internal Medicine

## 2010-11-04 NOTE — Assessment & Plan Note (Signed)
Summary: ALLERGIC REACTION TO MEDS//KT   Physical Exam  Skin:     dry, flaky skin around mouth, around neck eczema Nurse Visit   Vital Signs:  Patient Profile:   60 Years Old Female CC:      allergic reaction on face and neck Height:     60 inches BP sitting:   130 / 90  (left arm)  Vitals Entered By: Isla Pence (November 26, 2007 2:28 PM)                 Prior Medications: GLUCOPHAGE 1000 MG  TABS (METFORMIN HCL) 1 tablet by mouth two times a day for blood sugar LANTUS 100 UNIT/ML  SOLN (INSULIN GLARGINE) 30 units at night for diabetes LISINOPRIL 40 MG  TABS (LISINOPRIL) 1 tablet by mouth two times a day for blood pressure SIMVASTATIN 40 MG  TABS (SIMVASTATIN) 1 tablet by mouth at night for cholesterol SPIRONOLACTONE 25 MG  TABS (SPIRONOLACTONE) 1 tablet by mouth daily for fluid FUROSEMIDE 40 MG  TABS (FUROSEMIDE) 1 tablet by mouth daily for fluid BAYER LOW STRENGTH 81 MG  TBEC (ASPIRIN) 1 tablet by mouth daily for circulation NAPROSYN 500 MG  TABS (NAPROXEN) 1 tablet by mouth two times a day as needed for pain TOPROL XL 100 MG  TB24 (METOPROLOL SUCCINATE) 1 once daily CELEXA 20 MG  TABS (CITALOPRAM HYDROBROMIDE) 1 tablet by mouth daily for mood Current Allergies: No known allergies     Orders Added: 1)  Est. Patient Level I [id number]    Prescriptions: TRIAMCINOLONE ACETONIDE 0.1 %  OINT (TRIAMCINOLONE ACETONIDE) 1 application topically two times a day to affected area  #60gm x 0   Entered and Authorized by:   Aurora Mask FNP   Signed by:   Aurora Mask FNP on 11/26/2007   Method used:   Print then Give to Patient   [id number]  ][[id number]]   Impression & Recommendations:  Problem # 1:  ECZEMA (ICD-692.9) advised pt to apply sparingly to face. Her updated medication list for this problem includes:    Triamcinolone Acetonide 0.1 % Oint (Triamcinolone acetonide) .[name]... 1 application topically two times a day to affected  area   Complete Medication List: 1)  Glucophage 1000 Mg Tabs (Metformin hcl) .[name].. 1 tablet by mouth two times a day for blood sugar 2)  Lantus 100 Unit/ml Soln (Insulin glargine) .... 30 units at night for diabetes 3)  Lisinopril 40 Mg Tabs (Lisinopril) .[name].. 1 tablet by mouth two times a day for blood pressure 4)  Simvastatin 40 Mg Tabs (Simvastatin) .[name].. 1 tablet by mouth at night for cholesterol 5)  Spironolactone 25 Mg Tabs (Spironolactone) .[name].. 1 tablet by mouth daily for fluid 6)  Furosemide 40 Mg Tabs (Furosemide) .[name].. 1 tablet by mouth daily for fluid 7)  Bayer Low Strength 81 Mg Tbec (Aspirin) .[name].. 1 tablet by mouth daily for circulation 8)  Naprosyn 500 Mg Tabs (Naproxen) .[name].. 1 tablet by mouth two times a day as needed for pain 9)  Toprol Xl 100 Mg Tb24 (Metoprolol succinate) .[name].. 1 once daily 10)  Celexa 20 Mg Tabs (Citalopram hydrobromide) .[name].. 1 tablet by mouth daily for mood 11)  Triamcinolone Acetonide 0.1 % Oint (Triamcinolone acetonide) .[name].. 1 application topically two times a day to affected area

## 2010-11-04 NOTE — Cardiovascular Report (Signed)
Summary: Office Visit   Office Visit   Imported By: Sallee Provencal 08/20/2010 16:26:09  _____________________________________________________________________  External Attachment:    Type:   Image     Comment:   External Document

## 2010-11-04 NOTE — Progress Notes (Signed)
Summary: REFILLS  Phone Note Call from Patient Call back at Home Phone [phone]   Reason for Call: Refill Medication Summary of Call: [place] Initial call taken by: Roberto Scales,  April 11, 2008 12:54 PM  Follow-up for Phone Call        forwarded to provider Follow-up by: Isla Pence,  April 11, 2008 2:03 PM  Additional Follow-up for Phone Call Additional follow up Details #1::        rx printed and in basket pt fax to pharmacy Additional Follow-up by: Aurora Mask FNP,  April 11, 2008 2:22 PM    Additional Follow-up for Phone Call Additional follow up Details #2::    spoke with pt she states she need her glucophage filled also, Follow-up by: Isla Pence,  April 11, 2008 4:58 PM  Additional Follow-up for Phone Call Additional follow up Details #3:: Details for Additional Follow-up Action Taken: rx in basket  pt notified and rx sent to pharmacy..................Marland KitchenVinetta Bergamo CMA  April 14, 2008 12:10 PM  Additional Follow-up by: Aurora Mask FNP,  April 11, 2008 5:53 PM    Prescriptions: GLUCOPHAGE 1000 MG  TABS (METFORMIN HCL) 1 tablet by mouth two times a day for blood sugar  #60 x 3   Entered and Authorized by:   Aurora Mask FNP   Signed by:   Aurora Mask FNP on 04/11/2008   Method used:   Printed then faxed to ...         [id number] CELEXA 20 MG  TABS (CITALOPRAM HYDROBROMIDE) 1 tablet by mouth daily for mood  #30 x 3   Entered and Authorized by:   Aurora Mask FNP   Signed by:   Aurora Mask FNP on 04/11/2008   Method used:   Printed then faxed to ...         [id number] NEURONTIN 300 MG  CAPS (GABAPENTIN) 1 tablet by mouth at night  #30 x 3   Entered and Authorized by:   Aurora Mask FNP   Signed by:   Aurora Mask FNP on 04/11/2008   Method used:   Printed then faxed to ...         [id number]

## 2010-11-04 NOTE — Letter (Signed)
Summary: Handout Printed  Printed Handout:  - Chronic Obstructive Pulmonary Disease (COPD), Easy-to-Read

## 2010-11-04 NOTE — Consult Note (Signed)
Summary: [facility]   Imported By: Roland Earl 11/21/2007 10:31:34  _____________________________________________________________________  External Attachment:    Type:   Image     Comment:   External Document

## 2010-11-04 NOTE — Letter (Signed)
Summary: *[facility] Results Follow up  [facility], [place] [address]   Phone: [phone]  Fax: [phone]      05/10/2009   [name] [address] [place], [place]  [address]   Dear  Ms. Marcena Naumann,                            ____S.Drinkard,FNP   ____D. Gore,FNP       ____B. McPherson,MD   ____V. Rankins,MD    ____E. Mulberry,MD    __X__N. Hassell Done, FNP  ____D. Jobe Igo, MD    ____K. Tomma Lightning, MD    ____Other     This letter is to inform you that your recent test(s):  _______Pap Smear    _______Lab Test     ___X____Bone Density    ___X____ is within acceptable limits  _______ requires a medication change  _______ requires a follow-up lab visit  _______ requires a follow-up visit with your provider   Comments: Bone density is normal.       _________________________________________________________ If you have any questions, please contact our office [phone].                    Sincerely,    Aurora Mask FNP [facility]

## 2010-11-04 NOTE — Cardiovascular Report (Signed)
Summary: Office Visit   Office Visit   Imported By: Sallee Provencal 06/03/2010 15:50:32  _____________________________________________________________________  External Attachment:    Type:   Image     Comment:   External Document

## 2010-11-04 NOTE — Letter (Signed)
Summary: FAXED REQUESTED RECORDS TO [facility]  FAXED REQUESTED RECORDS TO [facility]   Imported By: Roland Earl 01/25/2008 16:08:10  _____________________________________________________________________  External Attachment:    Type:   Image     Comment:   External Document

## 2010-11-04 NOTE — Assessment & Plan Note (Signed)
Summary: Otitis Externa  Nurse Visit   Vital Signs:  Patient profile:   60 year old female Height:      60 inches Weight:      243.50 pounds BMI:     47.73 Temp:     97.3 degrees F oral Pulse rate:   60 / minute Pulse rhythm:   regular Resp:     18 per minute BP sitting:   130 / 90  (right arm) Cuff size:   large  Vitals Entered By: [place] Shannon (January 14, 2009 8:48 AM)   Review of Systems ENT:  Complains of earache; right ear swelling x 3 days, pain.   Physical Exam  Ears:  right ear tender with palpation of tragus canal swelling   Impression & Recommendations:  Problem # 1:  OTITIS EXTERNA, ACUTE, RIGHT (ICD-380.12)  Her updated medication list for this problem includes:    Cortisporin 3.5-10000-1 Soln (Neomycin-polymyxin-hc) .Marland KitchenMarland KitchenMarland KitchenMarland Kitchen 3 drops in right ear three times a day for up to 10 days  Complete Medication List: 1)  Lantus 100 Unit/ml Soln (Insulin glargine) .... 60 units at night for diabetes 2)  Lisinopril 40 Mg Tabs (Lisinopril) .[name].. 1 tablet by mouth daily for blood pressure 3)  Spironolactone 25 Mg Tabs (Spironolactone) .[name].. 1 tablet by mouth daily for fluid 4)  Furosemide 40 Mg Tabs (Furosemide) .[name].. 1 tablet by mouth daily for fluid 5)  Bayer Low Strength 81 Mg Tbec (Aspirin) .[name].. 1 tablet by mouth daily for circulation 6)  Naprosyn 500 Mg Tabs (Naproxen) .[name].. 1 tablet by mouth two times a day as needed for pain 7)  Toprol Xl 200 Mg Xr24h-tab (Metoprolol succinate) .... One tablet by mouth daily for blood pressure 8)  Celexa 20 Mg Tabs (Citalopram hydrobromide) .[name].. 1 tablet by mouth daily for mood 9)  Triamcinolone Acetonide 0.1 % Oint (Triamcinolone acetonide) .[name].. 1 application topically two times a day to affected area 10)  Lipitor 20 Mg Tabs (Atorvastatin calcium) .... One tablet by mouth nightly for cholesterol 11)  Darvocet-n 100 100-650 Mg Tabs (Propoxyphene n-apap) 12)  Neurontin 300 Mg Caps (Gabapentin) .[name].. 1 tablet by mouth at night 13)   Novolog Mix 70/30 Flexpen 70-30 % Susp (Insulin aspart prot & aspart) .[name].. 10 units before breakfast and 10 units before dinner 14)  Pen Needles 31g X 6 Mm Misc (Insulin pen needle) .... To use with novolog insulin two times a day 15)  Cortisporin 3.5-10000-1 Soln (Neomycin-polymyxin-hc) .... 3 drops in right ear three times a day for up to 10 days  CC: pt says her right ear started hurting Sunday and pt thought it was a ear ache because she went stayed out late Sat. night at a cookout...Marland KitchenMarland Kitchen pt said her husband saw the swelling Monday.......[name]  pt did not take anything for the pain......[name] pt says the ear pain is uncomfortable and sore..... Pain Assessment Patient in pain? yes     Location: right ear Intensity: 6 Type: aching Onset of pain  Gradual  Does patient need assistance? Ambulation Normal    Allergies: No Known Drug Allergies  Patient Instructions: 1)  Use ear drops in right ear three times a day until healed. 2)  follow up as previously scheduled  Laboratory Results   Blood Tests   Date/Time Received: January 14, 2009 8:52 AM   CBG Fasting:: 158mg /dL       Orders Added: 1)  Est. Patient Level I [id number]    Prescriptions: CORTISPORIN 3.5-10000-1 SOLN (NEOMYCIN-POLYMYXIN-HC) 3 drops  in right ear three times a day for up to 10 days  #73ml x 0   Entered and Authorized by:   Aurora Mask FNP   Signed by:   Aurora Mask FNP on 01/14/2009   Method used:   Print then Give to Patient   RxID:   [id number]  ]

## 2010-11-04 NOTE — Op Note (Signed)
Summary: RIGHT THIGH MASS  RIGHT THIGH MASS   Imported By: Roland Earl 12/26/2007 10:57:00  _____________________________________________________________________  External Attachment:    Type:   Image     Comment:   External Document

## 2010-11-04 NOTE — Assessment & Plan Note (Signed)
Summary: Diabetes/HTN   Vital Signs:  Patient Profile:   60 Years Old Female Height:     60 inches Weight:      238 pounds BMI:     46.65 BSA:     2.01 Temp:     97.6 degrees F oral Pulse rate:   70 / minute Pulse rhythm:   regular Resp:     14 per minute BP sitting:   120 / 80  (left arm)  Pt. in pain?   no  Vitals Entered By: Isla Pence (June 20, 2008 8:46 AM)              Is Patient Diabetic? Yes  CBG Result 172 CBG Device ID A   Vision Comments: 05/2009   Chief Complaint:  f/udm.  History of Present Illness:  Pt into the office for diabetes check. She was to get fasting labs today  Obesity - weight gain 2 pounds since last visit.    Cardiology - 3 month f/u. next visit is 07/09/08.  Diabetes Management History:      The patient is a 60 years old female who comes in for evaluation of DM Type 2.  She has not been enrolled in the "Diabetic Education Program".  She states understanding of dietary principles but she is not following the appropriate diet.  Sensory loss is noted.  Self foot exams are being performed.  She is checking home blood sugars.  She says that she is not exercising regularly.        Hypoglycemic symptoms are not occurring.  No hyperglycemic symptoms are reported.  Other comments include: pt brings blood sugar log into the office today. Lantus insulin --> 40 units at night.        No changes have been made to her treatment plan since last visit.        Her home fasting blood sugars are as follows: highest: 348; lowest: 134.    Hypertension History:      She denies headache, chest pain, and palpitations.  She notes no problems with any antihypertensive medication side effects.        Positive major cardiovascular risk factors include diabetes, hyperlipidemia, hypertension, and family history for ischemic heart disease (females less than 50 years old & males less than 58 years old).  Negative major cardiovascular risk factors include female  age less than 29 years old and non-tobacco-user status.        Positive history for target organ damage include cardiac end organ damage (either CHF or LVH).  Further assessment for target organ damage reveals no history of ASHD, stroke/TIA, peripheral vascular disease, renal insufficiency, or hypertensive retinopathy.    Lipid Management History:      Positive NCEP/ATP III risk factors include diabetes, family history for ischemic heart disease (females less than 20 years old & males less than 22 years old), and hypertension.  Negative NCEP/ATP III risk factors include female age less than 73 years old, no history of early menopause without estrogen hormone replacement, HDL cholesterol greater than 60, non-tobacco-user status, no ASHD (atherosclerotic heart disease), no prior stroke/TIA, no peripheral vascular disease, and no history of aortic aneurysm.        The patient states that she knows about the "Therapeutic Lifestyle Change" diet.  Her compliance with the TLC diet is poor.         Prior Medications Reviewed Using: Medication Bottles  Prior Medication List:  GLUCOPHAGE 1000 MG  TABS (METFORMIN HCL) 1  tablet by mouth two times a day for blood sugar LANTUS 100 UNIT/ML  SOLN (INSULIN GLARGINE) 40 units at night for diabetes LISINOPRIL 40 MG  TABS (LISINOPRIL) 1 tablet by mouth daily for blood pressure SIMVASTATIN 40 MG  TABS (SIMVASTATIN) 1 tablet by mouth at night for cholesterol SPIRONOLACTONE 25 MG  TABS (SPIRONOLACTONE) 1 tablet by mouth daily for fluid FUROSEMIDE 40 MG  TABS (FUROSEMIDE) 1 tablet by mouth daily for fluid BAYER LOW STRENGTH 81 MG  TBEC (ASPIRIN) 1 tablet by mouth daily for circulation NAPROSYN 500 MG  TABS (NAPROXEN) 1 tablet by mouth two times a day as needed for pain TOPROL XL 100 MG  TB24 (METOPROLOL SUCCINATE) 1 and 1/2 tablet by mouth daily CELEXA 20 MG  TABS (CITALOPRAM HYDROBROMIDE) 1 tablet by mouth daily for mood TRIAMCINOLONE ACETONIDE 0.1 %  OINT  (TRIAMCINOLONE ACETONIDE) 1 application topically two times a day to affected area LIPITOR 10 MG  TABS (ATORVASTATIN CALCIUM) take 2 tablets at bedtime for cholesterol DARVOCET-N 100 100-650 MG TABS (PROPOXYPHENE N-APAP)  NEURONTIN 300 MG  CAPS (GABAPENTIN) 1 tablet by mouth at night   Current Allergies: No known allergies      Review of Systems  CV      Denies chest pain or discomfort.  Resp      Denies cough.  GI      Denies abdominal pain.  Endo      Denies excessive hunger, excessive thirst, and excessive urination.   Physical Exam  General:     alert and overweight-appearing.   Eyes:     glasses Neck:     supple.   Lungs:     normal breath sounds.   Heart:     soft, S1S2 Abdomen:     soft, non-tender, and normal bowel sounds.   Msk:     up to exam table Pulses:     DP +2 Extremities:     no edema  Diabetes Management Exam:    Foot Exam (with socks and/or shoes not present):       Sensory-Monofilament:          Left foot: normal          Right foot: normal       Inspection:          Left foot: normal          Right foot: normal       Nails:          Left foot: thickened          Right foot: thickened    Eye Exam:       Eye Exam done elsewhere          Date: 05/05/2008          Results: normal          Done by: done at [facility]    Impression & Recommendations:  Problem # 1:  HYPERTENSION, BENIGN ESSENTIAL (ICD-401.1) stable Her updated medication list for this problem includes:    Lisinopril 40 Mg Tabs (Lisinopril) .[name]... 1 tablet by mouth daily for blood pressure    Spironolactone 25 Mg Tabs (Spironolactone) .[name]... 1 tablet by mouth daily for fluid    Furosemide 40 Mg Tabs (Furosemide) .[name]... 1 tablet by mouth daily for fluid    Toprol Xl 100 Mg Tb24 (Metoprolol succinate) .[name]... 1 and 1/2 tablet by mouth daily   Problem # 2:  DM (ICD-250.00) will give pneumovax today will give flu  vaccine next month need to increase insulin - poor  control. Advised pt that she needs to monitor diet eye exam last month Her updated medication list for this problem includes:    Glucophage 1000 Mg Tabs (Metformin hcl) .[name]... 1 tablet by mouth two times a day for blood sugar    Lantus 100 Unit/ml Soln (Insulin glargine) .Marland KitchenMarland KitchenMarland KitchenMarland Kitchen 50 units at night for diabetes    Lisinopril 40 Mg Tabs (Lisinopril) .[name]... 1 tablet by mouth daily for blood pressure    Bayer Low Strength 81 Mg Tbec (Aspirin) .[name]... 1 tablet by mouth daily for circulation  Orders: Hemoglobin A1C (83036)   Problem # 3:  OBESITY (ICD-278.00) continue to make efforts at weight loss  Problem # 4:  HYPERLIPIDEMIA (ICD-272.4) continue current meds The following medications were removed from the medication list:    Simvastatin 40 Mg Tabs (Simvastatin) .[name]... 1 tablet by mouth at night for cholesterol  Her updated medication list for this problem includes:    Lipitor 10 Mg Tabs (Atorvastatin calcium) .[name]... Take 2 tablets at bedtime for cholesterol   Complete Medication List: 1)  Glucophage 1000 Mg Tabs (Metformin hcl) .[name].. 1 tablet by mouth two times a day for blood sugar 2)  Lantus 100 Unit/ml Soln (Insulin glargine) .... 50 units at night for diabetes 3)  Lisinopril 40 Mg Tabs (Lisinopril) .[name].. 1 tablet by mouth daily for blood pressure 4)  Spironolactone 25 Mg Tabs (Spironolactone) .[name].. 1 tablet by mouth daily for fluid 5)  Furosemide 40 Mg Tabs (Furosemide) .[name].. 1 tablet by mouth daily for fluid 6)  Bayer Low Strength 81 Mg Tbec (Aspirin) .[name].. 1 tablet by mouth daily for circulation 7)  Naprosyn 500 Mg Tabs (Naproxen) .[name].. 1 tablet by mouth two times a day as needed for pain 8)  Toprol Xl 100 Mg Tb24 (Metoprolol succinate) .[name].. 1 and 1/2 tablet by mouth daily 9)  Celexa 20 Mg Tabs (Citalopram hydrobromide) .[name].. 1 tablet by mouth daily for mood 10)  Triamcinolone Acetonide 0.1 % Oint (Triamcinolone acetonide) .[name].. 1 application topically two times a day to affected area 11)   Lipitor 10 Mg Tabs (Atorvastatin calcium) .... Take 2 tablets at bedtime for cholesterol 12)  Darvocet-n 100 100-650 Mg Tabs (Propoxyphene n-apap) 13)  Neurontin 300 Mg Caps (Gabapentin) .[name].. 1 tablet by mouth at night  Other Orders: Capillary Blood Glucose [id number]) Fingerstick [id number]) Pneumococcal Vaccine [id number]) Admin 1st Vaccine [id number])  Diabetes Management Assessment/Plan:      The following lipid goals have been established for the patient: Total cholesterol goal of 200; LDL cholesterol goal of 100; HDL cholesterol goal of 40; Triglyceride goal of 150.  Her blood pressure goal is < 130/80.    Hypertension Assessment/Plan:      The patient's hypertensive risk group is category C: Target organ damage and/or diabetes.  Her calculated 10 year risk of coronary heart disease is 7 %.  Today's blood pressure is 120/80.  Her blood pressure goal is < 130/80.  Lipid Assessment/Plan:      Based on NCEP/ATP III, the patient's risk factor category is "history of diabetes".  From this information, the patient's calculated lipid goals are as follows: Total cholesterol goal is 200; LDL cholesterol goal is 100; HDL cholesterol goal is 40; Triglyceride goal is 150.     Patient Instructions: 1)  Call in October for Flu vaccine - nurse visit 2)  Increase you lantus to 50 units nighlty. 3)  Blood sugar is not doing well. You  need to try and figure out what you are eating that is causing the increase in blood sugar. 4)  You need a follow up in 6-8 weeks due to diabetes. (early November)   ]  Last LDL:                                                 77 (02/11/2008 10:41:00 PM)        Diabetic Foot Exam Foot Inspection Is there a history of a foot ulcer?              No Is there a foot ulcer now?              No Can the patient see the bottom of their feet?          Yes Are the shoes appropriate in style and fit?          Yes Is there swelling or an abnormal foot shape?          Yes Are the  toenails long?                No Are the toenails thick?                Yes Are the toenails ingrown?              Yes Is there heavy callous build-up?              No Is there a claw toe deformity?                          No Is there elevated skin temperature?            No Is there limited ankle dorsiflexion?            Yes Is there foot or ankle muscle weakness?            No Do you have pain in calf while walking?           Yes         10-g (5.07) Semmes-Weinstein Monofilament Test           Right Foot          Left Foot Visual Inspection               Test Control      normal         normal Site 1         normal         normal Site 2         normal         normal Site 3         normal         normal Site 4         normal         normal Site 5         normal         normal Site 6         normal         normal Site 7         normal         normal Site 8  normal         normal Site 9         normal         normal Site 10         normal         normal  Impression      normal         normal   Laboratory Results   Blood Tests   Date/Time Received: June 20, 2008 10:19 AM   HGBA1C: 11.4%   (Normal Range: Non-Diabetic - 3-6%   Control Diabetic - 6-8%) CBG Random:: 172       Pneumovax Vaccine    Vaccine Type: Pneumovax    Site: left deltoid    Mfr: Merck    Dose: 0.5 ml    Route: IM    Given by: Isla Pence    Exp. Date: 12/07/2008    Lot #: [id number]    VIS given: 04/30/96 version given June 20, 2008. ndc [id number]

## 2010-11-04 NOTE — Assessment & Plan Note (Signed)
Summary: Acute - COPD Flare   Vital Signs:  Patient profile:   60 year old female Menstrual status:  hysterectomy Weight:      247.4 pounds O2 Sat:      99 % on Room air Temp:     97.9 degrees F oral Pulse rate:   88 / minute Pulse rhythm:   regular BP sitting:   140 / 86  (right arm) Cuff size:   large  Vitals Entered By: Vinetta Bergamo CMA (January 15, 2010 9:22 AM)  O2 Flow:  Room air  Serial Vital Signs/Assessments:  Comments: 9:37 AM PF 120,120 (poor effort)  230 (good effort) By: Vinetta Bergamo CMA   CC: pt here f/u breathing..... Pain Assessment Patient in pain? no       Does patient need assistance? Functional Status Self care Ambulation Normal   Primary Care Provider:  Aurora Mask FNP  CC:  pt here f/u breathing.....[name]  History of Present Illness:  Pt into the office for f/u on breathing. Pt was seen in this office 2 days ago She was given depomedrol IM in office along with nebulizers. She was started on advair diskus which she has been using as ordered.  Sample will last about 1 week then pt will complete the supply.  She also has an ventolin inhaler that she uses prn    Habits & Providers  Alcohol-Tobacco-Diet     Alcohol drinks/day: 0     Tobacco Status: current     Tobacco Counseling: to quit use of tobacco products     Cigarette Packs/Day: 2-3cig     Year Quit: 10/2007  Exercise-Depression-Behavior     Does Patient Exercise: no     Exercise Counseling: to improve exercise regimen     Type of exercise: walking     Exercise (avg: min/session): <30     Times/week: <3     Depression Counseling: not indicated; screening negative for depression     Drug Use: no     Seat Belt Use: 100     Sun Exposure: occasionally  Allergies: 1)  ! * Actos 2)  ! Hydrocortisone (Hydrocortisone)  Review of Systems CV:  Denies chest pain or discomfort. Resp:  Complains of shortness of breath. GI:  Denies abdominal pain, nausea, and vomiting.  Physical  Exam  General:  alert.  obese Head:  normocephalic.   Lungs:  still few scattered wheezes improved from previous visit 2 days ago Heart:  normal rate and regular rhythm.   Msk:  up to the exam table Neurologic:  alert & oriented X3.     Impression & Recommendations:  Problem # 1:  CHRONIC OBSTRUCTIVE PULMONARY DISEASE, MODERATE (ICD-496) improved today will advise pt to continue advair as ordered (sample) ventolin as needed   Her updated medication list for this problem includes:    Ventolin Hfa 108 (90 Base) Mcg/act Aers (Albuterol sulfate) .[name]... 2 puffs every 6 hours as needed for shortness of breath    Advair Diskus 250-50 Mcg/dose Aepb (Fluticasone-salmeterol) ..... One inhalation two times a day (rinse mouth after use)  Orders: Peak Flow Rate (94150) Pulse Oximetry (single measurment) [id number])  Problem # 2:  TOBACCO ABUSE (ICD-305.1) Assessment: Unchanged advised cessation  Complete Medication List: 1)  Lantus 100 Unit/ml Soln (Insulin glargine) .... 70 units at night for diabetes 2)  Lisinopril 40 Mg Tabs (Lisinopril) .[name].. 1 tablet by mouth daily for blood pressure 3)  Spironolactone 25 Mg Tabs (Spironolactone) .[name].. 1 tablet by mouth daily  for fluid 4)  Furosemide 40 Mg Tabs (Furosemide) .[name].. 1 tablet by mouth daily for fluid 5)  Bayer Low Strength 81 Mg Tbec (Aspirin) .[name].. 1 tablet by mouth daily for circulation 6)  Toprol Xl 200 Mg Xr24h-tab (Metoprolol succinate) .... One tablet by mouth daily for blood pressure 7)  Celexa 20 Mg Tabs (Citalopram hydrobromide) .[name].. 1 tablet by mouth daily for mood 8)  Triamcinolone Acetonide 0.1 % Oint (Triamcinolone acetonide) .[name].. 1 application topically two times a day to affected area 9)  Lipitor 20 Mg Tabs (Atorvastatin calcium) .... One tablet by mouth nightly for cholesterol 10)  Neurontin 300 Mg Caps (Gabapentin) .[name].. 1 tablet by mouth at night 11)  Novolog Mix 70/30 Flexpen 70-30 % Susp (Insulin aspart prot & aspart) .[name].. 15  units before breakfast and 15 units before dinner 12)  Pen Needles 31g X 6 Mm Misc (Insulin pen needle) .... To use with novolog insulin two times a day 13)  Ultram 50 Mg Tabs (Tramadol hcl) .[name].. 1-2 tablet by mouth two times a day as needed for pain 14)  Ventolin Hfa 108 (90 Base) Mcg/act Aers (Albuterol sulfate) .... 2 puffs every 6 hours as needed for shortness of breath 15)  Advair Diskus 250-50 Mcg/dose Aepb (Fluticasone-salmeterol) .... One inhalation two times a day (rinse mouth after use) 16)  Ketoconazole 2 % Sham (Ketoconazole) .... Apply to scalp three times per week - let later and sit for 5 minutes then rinse  Other Orders: Capillary Blood Glucose/CBG [id number])  Asthma Management Plan    Asthma Severity: Severe Persistent    Personal best PEF: 230 liters/minute    Predicted PEF: 440 liters/minute    Working PEF: 230 liters/minute    Plan based on PEF formula: [employer] Zone: (Range: 180 to 230) FLOVENT HFA 44 MCG/ACT AERO ADVAIR DISKUS 250-50 MCG/DOSE AEPB:  1 inhalation twice a day  Yellow Zone: PREDNISONE 50 MG TABS VENTOLIN HFA 108 (90 BASE) MCG/ACT AERS:  2 puffs every 4-6 hours  Red Zone:  Call your physician for shortness of breath.    Patient Instructions: 1)  Keep next scheduled visit on May 5th at 8:45AM 2)  If breathing gets worse on next week then inform this office. 3)  Use all the advair sample.  If breathing gets worse when you finish then you may need to take start on this medication  Laboratory Results   Blood Tests   Date/Time Received: January 15, 2010 9:27 AM  CBG Fasting:: 140mg /dL

## 2010-11-04 NOTE — Assessment & Plan Note (Signed)
Summary: Diabetes   Vital Signs:  Patient profile:   60 year old female Menstrual status:  hysterectomy Weight:      244 pounds O2 Sat:      96 % on Room air Temp:     97.9 degrees F Pulse rate:   81 / minute Pulse rhythm:   regular Resp:     20 per minute BP sitting:   136 / 102  (left arm) Cuff size:   large  Vitals Entered By: Shellia Carwin CMA (Feb 04, 2010 8:36 AM)  O2 Flow:  Room air  Serial Vital Signs/Assessments:  Comments: Peak Flows  1. 200  2. 175  3. 175 By: Shellia Carwin CMA   CC: f/u dm, has been having some cramps in her feet.  Forgot to bring meds., Lipid Management, CHF Management Is Patient Diabetic? Yes Pain Assessment Patient in pain? no      CBG Result 203  Does patient need assistance? Ambulation Normal   Primary Care Provider:  Aurora Mask FNP  CC:  f/u dm, has been having some cramps in her feet.  Forgot to bring meds., Lipid Management, and CHF Management.  History of Present Illness:  Pt into the office for diabetes   COPD - Pt was seen on last month for COPD flare and was given prednisone taper and given a sample of advair. She has completed the supply of advair and admits that she is feeling better.  Obesity - pt is trying to increase her exercise and walk more she has also tried to modify her diet and eat smaller portions  Pt did not bring her medications with her to the office.  (Pharmacy sheet reviewed and pt has not refilled any medications since 12/07/2009)  Pt reports that she has "extra" medication when confronted about not refilling meds since March 2011  CHF History:      Daily weights are being checked.  She understands fluid management and sodium restriction and is following this regimen.  The patient expresses understanding of the treatment plan and her medications.  She admits to being compliant with her medications.  ADL's are being done without symptoms or restrictions.  The patient has not been enrolled in the CHF  Eduction Program.    Diabetes Management History:      The patient is a 60 years old female who comes in for evaluation of Type 2 Diabetes Mellitus.  She has not been enrolled in the "Diabetic Education Program".  She states understanding of dietary principles but she is not following the appropriate diet.  No sensory loss is reported.  Self foot exams are not being performed.  She is checking home blood sugars.  She says that she is not exercising regularly.        Hypoglycemic symptoms are not occurring.  No hyperglycemic symptoms are reported.        No changes have been made to her treatment plan since last visit.    Lipid Management History:      Positive NCEP/ATP III risk factors include female age 86 years old or older, diabetes, family history for ischemic heart disease (females less than 65 years old & males less than 3 years old), current tobacco user, and hypertension.  Negative NCEP/ATP III risk factors include no history of early menopause without estrogen hormone replacement, HDL cholesterol greater than 60, no ASHD (atherosclerotic heart disease), no prior stroke/TIA, no peripheral vascular disease, and no history of aortic aneurysm.  The patient states that she knows about the "Therapeutic Lifestyle Change" diet.  Her compliance with the TLC diet is poor.  The patient does not know about adjunctive measures for cholesterol lowering.  Adjunctive measures started by the patient include weight reduction.  She expresses no side effects from her lipid-lowering medication.  The patient denies any symptoms to suggest myopathy or liver disease.    Diabetic Foot Exam Foot Inspection Is there a history of a foot ulcer?              No Is there a foot ulcer now?              No Can the patient see the bottom of their feet?          No Are the shoes appropriate in style and fit?          Yes Is there swelling or an abnormal foot shape?          No Are the toenails long?                 No Are the toenails thick?                Yes Are the toenails ingrown?              No Is there heavy callous build-up?              No Is there pain in the calf muscle (Intermittent claudication) when walking?    No Diabetic Foot Care Education Pulse Check          Right Foot          Left Foot Dorsalis Pedis:        diminished            diminished      Habits & Providers  Alcohol-Tobacco-Diet     Alcohol drinks/day: 0     Tobacco Status: current     Tobacco Counseling: to quit use of tobacco products     Cigarette Packs/Day: 2-3cig     Year Quit: 10/2007  Exercise-Depression-Behavior     Does Patient Exercise: no     Exercise Counseling: to improve exercise regimen     Type of exercise: walking     Exercise (avg: min/session): <30     Times/week: <3     Depression Counseling: not indicated; screening negative for depression     Drug Use: no     Seat Belt Use: 100     Sun Exposure: occasionally  Current Medications (verified): 1)  Lantus 100 Unit/ml  Soln (Insulin Glargine) .... 70 Units At Night For Diabetes 2)  Lisinopril 40 Mg  Tabs (Lisinopril) .[name].. 1 Tablet By Mouth Daily For Blood Pressure 3)  Spironolactone 25 Mg  Tabs (Spironolactone) .[name].. 1 Tablet By Mouth Daily For Fluid 4)  Furosemide 40 Mg  Tabs (Furosemide) .[name].. 1 Tablet By Mouth Daily For Fluid 5)  Bayer Low Strength 81 Mg  Tbec (Aspirin) .[name].. 1 Tablet By Mouth Daily For Circulation 6)  Toprol Xl 200 Mg Xr24h-Tab (Metoprolol Succinate) .... One Tablet By Mouth Daily For Blood Pressure 7)  Celexa 20 Mg  Tabs (Citalopram Hydrobromide) .[name].. 1 Tablet By Mouth Daily For Mood 8)  Triamcinolone Acetonide 0.1 %  Oint (Triamcinolone Acetonide) .[name].. 1 Application Topically Two Times A Day To Affected Area 9)  Lipitor 20 Mg Tabs (Atorvastatin Calcium) .... One Tablet By Mouth Nightly For Cholesterol 10)  Neurontin 300 Mg  Caps (Gabapentin) .[name].. 1 Tablet By Mouth At Night 11)  Novolog Mix 70/30 Flexpen 70-30 % Susp  (Insulin Aspart Prot & Aspart) .[name].. 15 Units Before Breakfast and 15 Units Before Dinner 12)  Pen Needles 31g X 6 Mm Misc (Insulin Pen Needle) .... To Use With Novolog Insulin Two Times A Day 13)  Ultram 50 Mg Tabs (Tramadol Hcl) .[name].. 1-2 Tablet By Mouth Two Times A Day As Needed For Pain 14)  Ventolin Hfa 108 (90 Base) Mcg/act Aers (Albuterol Sulfate) .... 2 Puffs Every 6 Hours As Needed For Shortness of Breath 15)  Advair Diskus 250-50 Mcg/dose Aepb (Fluticasone-Salmeterol) .... One Inhalation Two Times A Day (Rinse Mouth After Use) 16)  Ketoconazole 2 % Sham (Ketoconazole) .... Apply To Scalp Three Times Per Week - Let Later and Sit For 5 Minutes Then Rinse  Allergies (verified): 1)  ! * Actos 2)  ! Hydrocortisone (Hydrocortisone)  Review of Systems CV:  Denies chest pain or discomfort. Resp:  Denies cough and wheezing. GI:  Denies abdominal pain, nausea, and vomiting. MS:  cramps in feet.  Physical Exam  General:  alert.  obese Head:  normocephalic.   Lungs:  normal breath sounds.   Heart:  normal rate and regular rhythm.   Neurologic:  alert & oriented X3.   Skin:  color normal.   Psych:  Oriented X3.    Diabetes Management Exam:    Foot Exam (with socks and/or shoes not present):       Inspection:          Left foot: normal          Right foot: normal       Nails:          Left foot: thickened          Right foot: thickened   Impression & Recommendations:  Problem # 1:  DM (ICD-250.00) Hgba1c = 12.5 pharmacy reports indicates that pt has NOT picked up her insulin from the pharmacy since 01/07/2010 - likely cause for elevated Hbga1c. no need to increase the insulin as pt has not taken current dose Her updated medication list for this problem includes:    Lantus 100 Unit/ml Soln (Insulin glargine) .Marland KitchenMarland KitchenMarland KitchenMarland Kitchen 70 units at night for diabetes    Lisinopril 40 Mg Tabs (Lisinopril) .[name]... 1 tablet by mouth daily for blood pressure    Bayer Low Strength 81 Mg Tbec (Aspirin) .[name]... 1  tablet by mouth daily for circulation    Novolog Mix 70/30 Flexpen 70-30 % Susp (Insulin aspart prot & aspart) .Marland KitchenMarland KitchenMarland KitchenMarland Kitchen 15 units before breakfast and 15 units before dinner  Orders: Hemoglobin A1C (83036)  Problem # 2:  OBESITY (ICD-278.00) down 3 pounds since her last visit encouraged her to keep moving and increase her exercise  Problem # 3:  CHRONIC OBSTRUCTIVE PULMONARY DISEASE, MODERATE (ICD-496) Advised pt to continue the advair Her updated medication list for this problem includes:    Ventolin Hfa 108 (90 Base) Mcg/act Aers (Albuterol sulfate) .[name]... 2 puffs every 6 hours as needed for shortness of breath    Advair Diskus 250-50 Mcg/dose Aepb (Fluticasone-salmeterol) ..... One inhalation two times a day (rinse mouth after use)  Orders: Peak Flow Rate (94150) Pulse Oximetry (single measurment) (94760)  Problem # 4:  HYPERTENSION, BENIGN ESSENTIAL (ICD-401.1) BP elevated today but again pharmacy reviewe indicates that pt has not picked up meds since 12/07/2009 advised pt to take meds as ordered - finances do play a part  in pt getting meds. Her updated medication list for this problem includes:    Lisinopril 40 Mg Tabs (Lisinopril) .[name]... 1 tablet by mouth daily for blood pressure    Spironolactone 25 Mg Tabs (Spironolactone) .[name]... 1 tablet by mouth daily for fluid    Furosemide 40 Mg Tabs (Furosemide) .[name]... 1 tablet by mouth daily for fluid    Toprol Xl 200 Mg Xr24h-tab (Metoprolol succinate) ..... One tablet by mouth daily for blood pressure  Complete Medication List: 1)  Lantus 100 Unit/ml Soln (Insulin glargine) .... 70 units at night for diabetes 2)  Lisinopril 40 Mg Tabs (Lisinopril) .[name].. 1 tablet by mouth daily for blood pressure 3)  Spironolactone 25 Mg Tabs (Spironolactone) .[name].. 1 tablet by mouth daily for fluid 4)  Furosemide 40 Mg Tabs (Furosemide) .[name].. 1 tablet by mouth daily for fluid 5)  Bayer Low Strength 81 Mg Tbec (Aspirin) .[name].. 1 tablet by mouth daily for  circulation 6)  Toprol Xl 200 Mg Xr24h-tab (Metoprolol succinate) .... One tablet by mouth daily for blood pressure 7)  Celexa 20 Mg Tabs (Citalopram hydrobromide) .[name].. 1 tablet by mouth daily for mood 8)  Triamcinolone Acetonide 0.1 % Oint (Triamcinolone acetonide) .[name].. 1 application topically two times a day to affected area 9)  Lipitor 20 Mg Tabs (Atorvastatin calcium) .... One tablet by mouth nightly for cholesterol 10)  Neurontin 300 Mg Caps (Gabapentin) .[name].. 1 tablet by mouth at night 11)  Novolog Mix 70/30 Flexpen 70-30 % Susp (Insulin aspart prot & aspart) .[name].. 15 units before breakfast and 15 units before dinner 12)  Pen Needles 31g X 6 Mm Misc (Insulin pen needle) .... To use with novolog insulin two times a day 13)  Ultram 50 Mg Tabs (Tramadol hcl) .[name].. 1-2 tablet by mouth two times a day as needed for pain 14)  Ventolin Hfa 108 (90 Base) Mcg/act Aers (Albuterol sulfate) .... 2 puffs every 6 hours as needed for shortness of breath 15)  Advair Diskus 250-50 Mcg/dose Aepb (Fluticasone-salmeterol) .... One inhalation two times a day (rinse mouth after use) 16)  Ketoconazole 2 % Sham (Ketoconazole) .... Apply to scalp three times per week - let later and sit for 5 minutes then rinse  Other Orders: Capillary Blood Glucose/CBG [id number])  Asthma Management Plan    Asthma Severity: Severe Persistent    Personal best PEF: 230 liters/minute    Predicted PEF: 440 liters/minute    Working PEF: 200 liters/minute    Plan based on PEF formula: [employer] Zone: (Range: 180 to 230)  ADVAIR DISKUS 250-50 MCG/DOSE AEPB:  1 inhalation twice a day  Yellow Zone:  VENTOLIN HFA 108 (90 BASE) MCG/ACT AERS:  2 puffs every 4-6 hours  Red Zone:  Call your physician for shortness of breath.    CHF Assessment/Plan:      The patient's current weight is 244 pounds.  Her previous weight was 247.4 pounds.    Diabetes Management Assessment/Plan:      The following lipid goals have been  established for the patient: Total cholesterol goal of 200; LDL cholesterol goal of 100; HDL cholesterol goal of 40; Triglyceride goal of 150.  Her blood pressure goal is < 130/80.    Lipid Assessment/Plan:      Based on NCEP/ATP III, the patient's risk factor category is "history of diabetes".  The patient's lipid goals are as follows: Total cholesterol goal is 200; LDL cholesterol goal is 100; HDL cholesterol goal is 40; Triglyceride goal  is 150.    Patient Instructions: 1)  Your Hga1c = 12.5 2)  Blood sugar is still uncontrolled. 3)  Medication refills are at the pharmacy.  Please be sure you get them refilled. 4)  COPD - you need to stop smoking. 5)  Will add advair to your medications - take twice daily. 6)  Follow up in 3 months  for diabetes. 7)  Come fasting for labs Prescriptions: LIPITOR 20 MG TABS (ATORVASTATIN CALCIUM) One tablet by mouth nightly for cholesterol  #30 x 5   Entered and Authorized by:   Aurora Mask FNP   Signed by:   Aurora Mask FNP on 02/04/2010   Method used:   Faxed to ...       [facility] (retail)       [place], [place]  [address]       Ph: [phone] [place]       Fax: [phone]   RxID:   [id number] ADVAIR DISKUS 250-50 MCG/DOSE AEPB (FLUTICASONE-SALMETEROL) One inhalation two times a day (rinse mouth after use)  #1 x 5   Entered and Authorized by:   Aurora Mask FNP   Signed by:   Aurora Mask FNP on 02/04/2010   Method used:   Faxed to ...       [facility] (retail)       [place], [place]  [address]       Ph: [phone] [place]       Fax: [phone]   RxID:   [id number]   Prevention & Chronic Care Immunizations   Influenza vaccine: Fluvax 3+  (07/29/2009)    Tetanus booster: 10/03/2001: historical per pt    Pneumococcal vaccine: Pneumovax  (06/20/2008)  Colorectal Screening   Hemoccult: Not  documented    Colonoscopy: Not documented  Other Screening   Pap smear: Not documented    Mammogram: ASSESSMENT: Negative - BI-RADS 1^MM DIGITAL SCREENING  (05/04/2009)   Smoking status: current  (02/04/2010)   Smoking cessation counseling: yes  (08/23/2007)  Diabetes Mellitus   HgbA1C: 12.5  (02/04/2010)    Eye exam: normal  (05/05/2008)    Foot exam: yes  (02/04/2010)   High risk foot: Not documented   Foot care education: Done  (03/12/2009)    Urine microalbumin/creatinine ratio: Not documented  Lipids   Total Cholesterol: 160  (07/29/2009)   LDL: 78  (07/29/2009)   LDL Direct: 122.3  (03/06/2007)   HDL: 67  (07/29/2009)   Triglycerides: 74  (07/29/2009)    SGOT (AST): 20  (07/29/2009)   SGPT (ALT): 21  (07/29/2009)   Alkaline phosphatase: 98  (07/29/2009)   Total bilirubin: 0.4  (07/29/2009)  Hypertension   Last Blood Pressure: 136 / 102  (02/04/2010)   Serum creatinine: 0.7  (10/04/2009)   Serum potassium 3.3  (10/04/2009)  Self-Management Support :    Diabetes self-management support: Not documented    Hypertension self-management support: Not documented    Lipid self-management support: Not documented    Laboratory Results   Blood Tests   Date/Time Received: Feb 04, 2010 9:19 AM   HGBA1C: 12.5%   (Normal Range: Non-Diabetic - 3-6%   Control Diabetic - 6-8%) CBG Random:: 203mg /dL

## 2010-11-04 NOTE — Assessment & Plan Note (Signed)
Summary: Diabetes   Vital Signs:  Patient profile:   60 year old female Menstrual status:  hysterectomy Weight:      248.8 pounds BMI:     48.77 Temp:     97.4 degrees F oral Pulse rate:   72 / minute Pulse rhythm:   regular Resp:     16 per minute BP sitting:   150 / 94  (left arm)  Vitals Entered By: Isla Pence (August 06, 2010 8:53 AM)  Nutrition Counseling: Patient's BMI is greater than 25 and therefore counseled on weight management options. CC: follow-up visit DM, Hypertension Management, Lipid Management Is Patient Diabetic? Yes Pain Assessment Patient in pain? no       Does patient need assistance? Functional Status Self care Ambulation Normal   Primary Care Jaceion Aday:  Aurora Mask FNP  CC:  follow-up visit DM, Hypertension Management, and Lipid Management.  History of Present Illness:  Pt is into the office for 3 month f/u.  Diabetes Management History:      The patient is a 60 years old female who comes in for evaluation of Type 2 Diabetes Mellitus.  She has not been enrolled in the "Diabetic Education Program".  She states lack of understanding of dietary principles and is not following her diet appropriately.  No sensory loss is reported.  Self foot exams are not being performed.  She is checking home blood sugars.  She says that she is not exercising regularly.        Hypoglycemic symptoms are not occurring.  No hyperglycemic symptoms are reported.        No changes have been made to her treatment plan since last visit.    Hypertension History:      She denies headache, chest pain, and palpitations.  Pt did NOT take her meds today due to fasting status.        Positive major cardiovascular risk factors include female age 61 years old or older, diabetes, hyperlipidemia, hypertension, family history for ischemic heart disease (females less than 60 years old & males less than 76 years old), and current tobacco user.        Positive history for target  organ damage include cardiac end organ damage (either CHF or LVH).  Further assessment for target organ damage reveals no history of ASHD, stroke/TIA, peripheral vascular disease, renal insufficiency, or hypertensive retinopathy.    Lipid Management History:      Positive NCEP/ATP III risk factors include female age 72 years old or older, diabetes, family history for ischemic heart disease (females less than 37 years old & males less than 55 years old), current tobacco user, and hypertension.  Negative NCEP/ATP III risk factors include no history of early menopause without estrogen hormone replacement, HDL cholesterol greater than 60, no ASHD (atherosclerotic heart disease), no prior stroke/TIA, no peripheral vascular disease, and no history of aortic aneurysm.        The patient states that she knows about the "Therapeutic Lifestyle Change" diet.  Her compliance with the TLC diet is poor.  Adjunctive measures started by the patient include ASA.  She expresses no side effects from her lipid-lowering medication.  The patient denies any symptoms to suggest myopathy or liver disease.      Habits & Providers  Alcohol-Tobacco-Diet     Alcohol drinks/day: 0     Alcohol Counseling: not indicated; use of alcohol is not excessive or problematic     Tobacco Status: current  Tobacco Counseling: to quit use of tobacco products     Cigarette Packs/Day: 2-3cig     Year Quit: 10/2007  Exercise-Depression-Behavior     Does Patient Exercise: no     Exercise Counseling: to improve exercise regimen     Type of exercise: walking     Exercise (avg: min/session): <30     Times/week: <3     Depression Counseling: not indicated; screening negative for depression     Drug Use: no     Seat Belt Use: 100     Sun Exposure: occasionally  Comments: Pt admits that she has stopped drinking beer.  She had never admitted before today that she drank ETOH but reports that blood sugar is doing better because she has  stopped drinking  Allergies (verified): 1)  ! * Actos 2)  ! Hydrocortisone (Hydrocortisone)  Review of Systems General:  Denies fever. CV:  Denies chest pain or discomfort. Resp:  Denies cough. GI:  Denies abdominal pain, nausea, and vomiting.  Physical Exam  General:  alert.  obese Head:  normocephalic.   Eyes:  glasses Lungs:  normal breath sounds.   Heart:  normal rate and regular rhythm.   Abdomen:  normal bowel sounds.   Neurologic:  alert & oriented X3.   Skin:  color normal.   Psych:  Oriented X3.    Diabetes Management Exam:    Foot Exam (with socks and/or shoes not present):       Sensory-Monofilament:          Left foot: normal          Right foot: normal       Nails:          Left foot: thickened          Right foot: thickened   Impression & Recommendations:  Problem # 1:  DM (ICD-250.00)  blood sugar log reviewed Hgba1c still elevated but improved for pt Her updated medication list for this problem includes:    Lantus 100 Unit/ml Soln (Insulin glargine) .Marland KitchenMarland KitchenMarland KitchenMarland Kitchen 70 units at night for diabetes    Lisinopril 40 Mg Tabs (Lisinopril) .[name]... 1 tablet by mouth daily for blood pressure    Bayer Low Strength 81 Mg Tbec (Aspirin) .[name]... 1 tablet by mouth daily for circulation    Novolog Mix 70/30 Flexpen 70-30 % Susp (Insulin aspart prot & aspart) .Marland KitchenMarland KitchenMarland KitchenMarland Kitchen 15 units before breakfast and 15 units before dinner  Orders: Capillary Blood Glucose/CBG (82948) Hgb A1C [id number])  Problem # 2:  HYPERTENSION, BENIGN ESSENTIAL (ICD-401.1) BP elevated today but pt has not taken BP meds today Advised her to monitor diet - low sodium weight is also an issue Her updated medication list for this problem includes:    Lisinopril 40 Mg Tabs (Lisinopril) .[name]... 1 tablet by mouth daily for blood pressure    Spironolactone 25 Mg Tabs (Spironolactone) .[name]... 1 tablet by mouth daily for fluid    Furosemide 40 Mg Tabs (Furosemide) .[name]... 1 tablet by mouth daily for fluid    Toprol Xl 200 Mg  Xr24h-tab (Metoprolol succinate) ..... One tablet by mouth daily for blood pressure  Problem # 3:  TOBACCO ABUSE (ICD-305.1) advised cessation  Problem # 4:  CHRONIC OBSTRUCTIVE PULMONARY DISEASE, MODERATE (ICD-496)  Her updated medication list for this problem includes:    Ventolin Hfa 108 (90 Base) Mcg/act Aers (Albuterol sulfate) .[name]... 2 puffs every 6 hours as needed for shortness of breath  Problem # 5:  NEED PROPHYLACTIC VACCINATION&INOCULATION FLU (  ICD-V04.81)  given today  Orders: Flu Vaccine 16yrs + [id number]) Admin 1st Vaccine [id number])  Complete Medication List: 1)  Lantus 100 Unit/ml Soln (Insulin glargine) .... 70 units at night for diabetes 2)  Lisinopril 40 Mg Tabs (Lisinopril) .[name].. 1 tablet by mouth daily for blood pressure 3)  Spironolactone 25 Mg Tabs (Spironolactone) .[name].. 1 tablet by mouth daily for fluid 4)  Furosemide 40 Mg Tabs (Furosemide) .[name].. 1 tablet by mouth daily for fluid 5)  Bayer Low Strength 81 Mg Tbec (Aspirin) .[name].. 1 tablet by mouth daily for circulation 6)  Toprol Xl 200 Mg Xr24h-tab (Metoprolol succinate) .... One tablet by mouth daily for blood pressure 7)  Celexa 20 Mg Tabs (Citalopram hydrobromide) .[name].. 1 tablet by mouth daily for mood 8)  Triamcinolone Acetonide 0.1 % Oint (Triamcinolone acetonide) .[name].. 1 application topically two times a day to affected area 9)  Lipitor 40 Mg Tabs (Atorvastatin calcium) .... One tablet by mouth nightly for cholesterol  **pharmacy - note change in dose** 10)  Neurontin 300 Mg Caps (Gabapentin) .[name].. 1 tablet by mouth at night 11)  Novolog Mix 70/30 Flexpen 70-30 % Susp (Insulin aspart prot & aspart) .[name].. 15 units before breakfast and 15 units before dinner 12)  Pen Needles 31g X 6 Mm Misc (Insulin pen needle) .... To use with novolog insulin two times a day 13)  Ultram 50 Mg Tabs (Tramadol hcl) .[name].. 1-2 tablet by mouth two times a day as needed for pain 14)  Ventolin Hfa 108 (90 Base) Mcg/act Aers (Albuterol sulfate)  .... 2 puffs every 6 hours as needed for shortness of breath 15)  Ketoconazole 2 % Sham (Ketoconazole) .... Apply to scalp three times per week - let later and sit for 5 minutes then rinse  Diabetes Management Assessment/Plan:      The following lipid goals have been established for the patient: Total cholesterol goal of 200; LDL cholesterol goal of 100; HDL cholesterol goal of 40; Triglyceride goal of 150.  Her blood pressure goal is < 130/80.    Hypertension Assessment/Plan:      The patient's hypertensive risk group is category C: Target organ damage and/or diabetes.  Her calculated 10 year risk of coronary heart disease is 20 %.  Today's blood pressure is 150/94.  Her blood pressure goal is < 130/80.  Lipid Assessment/Plan:      Based on NCEP/ATP III, the patient's risk factor category is "history of diabetes".  The patient's lipid goals are as follows: Total cholesterol goal is 200; LDL cholesterol goal is 100; HDL cholesterol goal is 40; Triglyceride goal is 150.    Patient Instructions: 1)  Diabetes - Blood sugar log is doing well. 2)  Remember to take lantus 70units nightly 3)  Continue novolog during the day 4)  Your Hba1c = 9.3. Remember the goal is less than 7 5)  Blood pressure - slightly elevated today but this may be due to not taking your medications yet 6)  Flu vaccine given today 7)  Weight - increase 5 pounds since the last visit.  Try to continue to monitor diet and increase exercise 8)    9)  COPD - Use inhaler as needed (new prescription has been sent to the pharmacy) 10)  Follow up with n.martin,fnp in 3 months or sooner if necessary Prescriptions: VENTOLIN HFA 108 (90 BASE) MCG/ACT AERS (ALBUTEROL SULFATE) 2 puffs every 6 hours as needed for shortness of breath  #1 x 3   Entered and Authorized  by:   Aurora Mask FNP   Signed by:   Aurora Mask FNP on 08/06/2010   Method used:   Faxed to ...       [facility] (retail)        [place], [place]  [address]       Ph: [phone] [place]       Fax: [phone]   RxID:   [id number]    Orders Added: 1)  Flu Vaccine 56yrs + [id number] 2)  Admin 1st Vaccine [id number] 3)  Est. Patient Level IV [id number] 4)  Capillary Blood Glucose/CBG [82948] 5)  Hgb A1C [id number]   Immunizations Administered:  Influenza Vaccine # 1:    Vaccine Type: Fluvax 3+    Site: right deltoid    Mfr: GlaxoSmithKline    Dose: 0.5 ml    Route: IM    Given by: Isla Pence    Exp. Date: 04/02/2011    Lot #: [id number]    VIS given: 04/27/10 version given August 06, 2010.  Flu Vaccine Consent Questions:    Do you have a history of severe allergic reactions to this vaccine? no    Any prior history of allergic reactions to egg and/or gelatin? no    Do you have a sensitivity to the preservative Thimersol? no    Do you have a past history of Guillan-Barre Syndrome? no    Do you currently have an acute febrile illness? no    Have you ever had a severe reaction to latex? no    Vaccine information given and explained to patient? yes    Are you currently pregnant? no    ndc  [phone]  Immunizations Administered:  Influenza Vaccine # 1:    Vaccine Type: Fluvax 3+    Site: right deltoid    Mfr: GlaxoSmithKline    Dose: 0.5 ml    Route: IM    Given by: Isla Pence    Exp. Date: 04/02/2011    Lot #: [id number]    VIS given: 04/27/10 version given August 06, 2010.   Last LDL:                                                 135 (05/07/2010 10:21:00 PM)          Diabetic Foot Exam    10-g (5.07) Semmes-Weinstein Monofilament Test Performed by: Isla Pence          Right Foot          Left Foot Visual Inspection               Test Control      normal         normal Site 1         normal         normal Site 2         normal         normal Site 3         normal         normal Site 4         normal         normal Site 5         normal  normal Site 6         normal         normal Site 7         normal         normal Site 8         normal         normal Site 9         normal         normal Site 10         normal         normal  Impression      normal         normal     Laboratory Results   Blood Tests   Date/Time Received: August 06, 2010 9:39 AM   HGBA1C: 9.3%   (Normal Range: Non-Diabetic - 3-6%   Control Diabetic - 6-8%)

## 2010-11-04 NOTE — Progress Notes (Signed)
Summary: Lantus refill  Phone Note Call from Patient   Caller: Patient Summary of Call: PT WANT TO LET KNOW DR Hassell Done THAT SHE IS TAKING 40 UNITS OF INSULINE AND NO 20 . [name] HER  [name]  [name]  Initial call taken by: Maren Reamer,  Feb 28, 2008 2:18 PM  Follow-up for Phone Call        left message on machine for pt to return call to the office Follow-up by: Isla Pence,  Feb 29, 2008 12:42 PM  Additional Follow-up for Phone Call Additional follow up Details #1::        Left message on machine for pt to return call to the offic Additional Follow-up by: Isla Pence,  March 06, 2008 2:47 PM    Additional Follow-up for Phone Call Additional follow up Details #2::    Pt returned call. Pt needs updated script sent to [facility]. THey still have her only taking 20 units of insulin; Pt states she was told to increase to 40 units. Follow-up by: Rhunette Croft,  March 07, 2008 2:20 PM  Additional Follow-up for Phone Call Additional follow up Details #3:: Details for Additional Follow-up Action Taken: Rx printed and sent to pharmacy Additional Follow-up by: Aurora Mask FNP,  March 07, 2008 2:25 PM    Prescriptions: LANTUS 100 UNIT/ML  SOLN (INSULIN GLARGINE) 40 units at night for diabetes  #2 x 5   Entered and Authorized by:   Aurora Mask FNP   Signed by:   Aurora Mask FNP on 03/07/2008   Method used:   Printed then faxed to ...         [id number]

## 2010-11-04 NOTE — Progress Notes (Signed)
Summary: Needs office visit  Phone Note Call from Patient Call back at Home Phone [phone]   Summary of Call: Since last week, the pt has a bad cold and chest congestion. Pt had been taking her regular medication and she wants to know if the provider can call her something to the pharmacy [facility]) Rosalia FnP Initial call taken by: Alexis Goodell,  January 12, 2010 9:43 AM  Follow-up for Phone Call        Pt into the offfice today Follow-up by: Aurora Mask FNP,  January 13, 2010 4:10 PM

## 2010-11-04 NOTE — Procedures (Signed)
Summary: device/saf   Current Medications (verified): 1)  Lantus 100 Unit/ml  Soln (Insulin Glargine) .... 70 Units At Night For Diabetes 2)  Lisinopril 40 Mg  Tabs (Lisinopril) .[name].. 1 Tablet By Mouth Daily For Blood Pressure 3)  Spironolactone 25 Mg  Tabs (Spironolactone) .[name].. 1 Tablet By Mouth Daily For Fluid 4)  Furosemide 40 Mg  Tabs (Furosemide) .[name].. 1 Tablet By Mouth Daily For Fluid 5)  Bayer Low Strength 81 Mg  Tbec (Aspirin) .[name].. 1 Tablet By Mouth Daily For Circulation 6)  Toprol Xl 200 Mg Xr24h-Tab (Metoprolol Succinate) .... One Tablet By Mouth Daily For Blood Pressure 7)  Celexa 20 Mg  Tabs (Citalopram Hydrobromide) .[name].. 1 Tablet By Mouth Daily For Mood 8)  Triamcinolone Acetonide 0.1 %  Oint (Triamcinolone Acetonide) .[name].. 1 Application Topically Two Times A Day To Affected Area 9)  Lipitor 40 Mg Tabs (Atorvastatin Calcium) .... One Tablet By Mouth Nightly For Cholesterol  **pharmacy - Note Change in Dose** 10)  Neurontin 300 Mg  Caps (Gabapentin) .[name].. 1 Tablet By Mouth At Night 11)  Novolog Mix 70/30 Flexpen 70-30 % Susp (Insulin Aspart Prot & Aspart) .[name].. 15 Units Before Breakfast and 15 Units Before Dinner 12)  Pen Needles 31g X 6 Mm Misc (Insulin Pen Needle) .... To Use With Novolog Insulin Two Times A Day 13)  Ultram 50 Mg Tabs (Tramadol Hcl) .[name].. 1-2 Tablet By Mouth Two Times A Day As Needed For Pain 14)  Ventolin Hfa 108 (90 Base) Mcg/act Aers (Albuterol Sulfate) .... 2 Puffs Every 6 Hours As Needed For Shortness of Breath 15)  Ketoconazole 2 % Sham (Ketoconazole) .... Apply To Scalp Three Times Per Week - Let Later and Sit For 5 Minutes Then Rinse  Allergies (verified): 1)  ! * Actos 2)  ! Hydrocortisone (Hydrocortisone)   ICD Specifications Following MD:  Peggy Peru, MD     ICD Vendor:  [employer]     ICD Model Number:  [id number]     ICD Serial Number:  [id number] ICD DOI:  05/04/2007     ICD Implanting MD:  Peggy Peru, MD  Lead 1:    Location: RV     DOI: 05/04/2007      Model #: [id number]     Serial #: [id number]     Status: active  Indications::  NICM   ICD Follow Up Remote Check?  No Battery Voltage:  3.0 V     Charge Time:  10.8 seconds     Underlying rhythm:  ST@109  ICD Dependent:  No       ICD Device Measurements Right Ventricle:  Amplitude: 12 mV, Impedance: 530 ohms, Threshold: 0.5 V at 0.5 msec  Episodes Coumadin:  No Shock:  0     ATP:  0     Nonsustained:  0     Ventricular Pacing:  <1%  Brady Parameters Mode VVI     Lower Rate Limit:  40      Tachy Zones VF:  240     VT:  200     Next Cardiology Appt Due:  08/03/2010 Tech Comments:  No parameter changes.  Device function normal.  Atlas device battery 3.0.  ROV 3 months with Dr. Lovena Le. Alma Friendly, LPN  August  8, 624THL 10:54 AM

## 2010-11-04 NOTE — Assessment & Plan Note (Signed)
Summary: Diabetes/HTN   Vital Signs:  Patient profile:   60 year old female Menstrual status:  hysterectomy Weight:      244.0 pounds BMI:     47.83 BSA:     2.03 Temp:     98.0 degrees F oral Pulse rate:   80 / minute Pulse rhythm:   regular Resp:     20 per minute BP sitting:   156 / 100  (left arm) Cuff size:   large  Vitals Entered By: Isla Pence (July 29, 2009 8:38 AM) CC: follow-up visit DM, Hypertension Management Is Patient Diabetic? Yes Pain Assessment Patient in pain? no      CBG Result 72 CBG Device ID B  Does patient need assistance? Functional Status Self care Ambulation Normal   Primary Care Provider:  Aurora Mask FNP  CC:  follow-up visit DM and Hypertension Management.  History of Present Illness:  Pt into the office for follow up on diabetes.  Hair loss - pt tried a new technique of getting some hair sewn in however this time with removal all her hair came out.  This is not the first time she had this procedure however upon removal her hair fell out.  Diabetes - Pt is checking her blood sugar at least once per day while fasting. She presents today with blood sugar log.  Obesity - up 3 pounds since last visit. Pt has started to exercise - walk.  She needs lots of encouragement with improving exercise reoutine.  Pt has all her medications here into the office today  Diabetes Management History:      The patient is a 60 years old female who comes in for evaluation of Type 2 Diabetes Mellitus.  She has not been enrolled in the "Diabetic Education Program".  She states understanding of dietary principles but she is not following the appropriate diet.  No sensory loss is reported.  Self foot exams are not being performed.  She is checking home blood sugars.  She says that she is exercising.  Type of exercise includes: walking.  Duration of exercise is estimated to be <30  She is doing this <3 times per week.        Hypoglycemic symptoms are  not occurring.  No hyperglycemic symptoms are reported.        Her home blood sugars include fasting blood sugars: highest: 200, lowest: 67.    Hypertension History:      She denies headache, chest pain, and palpitations.  She notes no problems with any antihypertensive medication side effects.  Pt has not taken any meds today.        Positive major cardiovascular risk factors include female age 65 years old or older, diabetes, hyperlipidemia, hypertension, and family history for ischemic heart disease (females less than 27 years old & males less than 74 years old).  Negative major cardiovascular risk factors include non-tobacco-user status.        Positive history for target organ damage include cardiac end organ damage (either CHF or LVH).  Further assessment for target organ damage reveals no history of ASHD, stroke/TIA, peripheral vascular disease, renal insufficiency, or hypertensive retinopathy.    Diabetic Foot Exam Foot Inspection Is there a history of a foot ulcer?              No Is there a foot ulcer now?              No Can the patient see the bottom of  their feet?          No Are the shoes appropriate in style and fit?          Yes Is there swelling or an abnormal foot shape?          No Are the toenails long?                No Are the toenails thick?                Yes Are the toenails ingrown?              No Is there heavy callous build-up?              No Is there pain in the calf muscle (Intermittent claudication) when walking?    NoIs there a claw toe deformity?              No Is there elevated skin temperature?            No Is there limited ankle dorsiflexion?            No Is there foot or ankle muscle weakness?            No  Diabetic Foot Care Education Pulse Check          Right Foot          Left Foot Dorsalis Pedis:        normal            diminished    10-g (5.07) Semmes-Weinstein Monofilament Test Performed by: Isla Pence          Right Foot           Left Foot Visual Inspection                  Habits & Providers  Alcohol-Tobacco-Diet     Alcohol drinks/day: 0     Tobacco Status: quit < 6 months     Tobacco Counseling: to remain off tobacco products     Cigarette Packs/Day: 2-3cig     Year Quit: 10/2007  Exercise-Depression-Behavior     Does Patient Exercise: yes     Exercise Counseling: to improve exercise regimen     Type of exercise: walking     Exercise (avg: min/session): <30     Times/week: <3     Have you felt down or hopeless? no     Have you felt little pleasure in things? no     Depression Counseling: not indicated; screening negative for depression     Drug Use: no     Seat Belt Use: 100     Sun Exposure: occasionally  Comments: Pt is walking about 3 times per week with the grandchild.  Allergies (verified): 1)  ! * Actos  Review of Systems General:  Denies fever. CV:  Denies chest pain or discomfort. Resp:  Denies cough. GI:  Denies abdominal pain, nausea, and vomiting.  Physical Exam  General:  alert.  obese Head:  wig in place Lungs:  normal breath sounds.   Heart:  normal rate and regular rhythm.   Abdomen:  soft, non-tender, and normal bowel sounds.   Neurologic:  gait normal.    Diabetes Management Exam:    Foot Exam (with socks and/or shoes not present):       Sensory-Monofilament:          Left foot: normal  Right foot: normal       Nails:          Left foot: thickened          Right foot: thickened   Impression & Recommendations:  Problem # 1:  DM (ICD-250.00) Hgba1c still elevated at 8.9 but down from last check. continue efforts at improving Her updated medication list for this problem includes:    Lantus 100 Unit/ml Soln (Insulin glargine) .Marland KitchenMarland KitchenMarland KitchenMarland Kitchen 70 units at night for diabetes    Lisinopril 40 Mg Tabs (Lisinopril) .[name]... 1 tablet by mouth daily for blood pressure    Bayer Low Strength 81 Mg Tbec (Aspirin) .[name]... 1 tablet by mouth daily for circulation    Novolog Mix  70/30 Flexpen 70-30 % Susp (Insulin aspart prot & aspart) .Marland KitchenMarland KitchenMarland KitchenMarland Kitchen 15 units before breakfast and 15 units before dinner  Orders: Hgb A1C [id number]) Capillary Blood Glucose/CBG [id number]) T-Comprehensive Metabolic Panel (A999333) UA Dipstick w/o Micro (manual) (81002) T-Urine Microalbumin w/creat. ratio [phone])  Problem # 2:  HYPERTENSION, BENIGN ESSENTIAL (ICD-401.1) elevated today but no meds due to fasting status Her updated medication list for this problem includes:    Lisinopril 40 Mg Tabs (Lisinopril) .[name]... 1 tablet by mouth daily for blood pressure    Spironolactone 25 Mg Tabs (Spironolactone) .[name]... 1 tablet by mouth daily for fluid    Furosemide 40 Mg Tabs (Furosemide) .[name]... 1 tablet by mouth daily for fluid    Toprol Xl 200 Mg Xr24h-tab (Metoprolol succinate) ..... One tablet by mouth daily for blood pressure  Orders: T-Comprehensive Metabolic Panel (A999333) T-CBC w/Diff [id number]) T-HIV Antibody  (Reflex) [id number])  Problem # 3:  HYPERLIPIDEMIA (ICD-272.4) will check lipids today Her updated medication list for this problem includes:    Lipitor 20 Mg Tabs (Atorvastatin calcium) ..... One tablet by mouth nightly for cholesterol  Orders: T-Lipid Profile [id number]) T-Comprehensive Metabolic Panel (A999333)  Problem # 4:  NEED PROPHYLACTIC VACCINATION&INOCULATION FLU (ICD-V04.81) indication: diabetes  Complete Medication List: 1)  Lantus 100 Unit/ml Soln (Insulin glargine) .... 70 units at night for diabetes 2)  Lisinopril 40 Mg Tabs (Lisinopril) .[name].. 1 tablet by mouth daily for blood pressure 3)  Spironolactone 25 Mg Tabs (Spironolactone) .[name].. 1 tablet by mouth daily for fluid 4)  Furosemide 40 Mg Tabs (Furosemide) .[name].. 1 tablet by mouth daily for fluid 5)  Bayer Low Strength 81 Mg Tbec (Aspirin) .[name].. 1 tablet by mouth daily for circulation 6)  Naprosyn 500 Mg Tabs (Naproxen) .[name].. 1 tablet by mouth two times a day as needed for pain 7)  Toprol  Xl 200 Mg Xr24h-tab (Metoprolol succinate) .... One tablet by mouth daily for blood pressure 8)  Celexa 20 Mg Tabs (Citalopram hydrobromide) .[name].. 1 tablet by mouth daily for mood 9)  Triamcinolone Acetonide 0.1 % Oint (Triamcinolone acetonide) .[name].. 1 application topically two times a day to affected area 10)  Lipitor 20 Mg Tabs (Atorvastatin calcium) .... One tablet by mouth nightly for cholesterol 11)  Neurontin 300 Mg Caps (Gabapentin) .[name].. 1 tablet by mouth at night 12)  Novolog Mix 70/30 Flexpen 70-30 % Susp (Insulin aspart prot & aspart) .[name].. 15 units before breakfast and 15 units before dinner 13)  Pen Needles 31g X 6 Mm Misc (Insulin pen needle) .... To use with novolog insulin two times a day  Other Orders: Flu Vaccine 92yrs + [phone]) Admin 1st Vaccine [phone]) Admin 1st Vaccine [facility]) [id number]) T-TSH [phone])  Diabetes Management Assessment/Plan:      The following lipid goals have  been established for the patient: Total cholesterol goal of 200; LDL cholesterol goal of 100; HDL cholesterol goal of 40; Triglyceride goal of 150.  Her blood pressure goal is < 130/80.    Hypertension Assessment/Plan:      The patient's hypertensive risk group is category C: Target organ damage and/or diabetes.  Her calculated 10 year risk of coronary heart disease is 17 %.  Today's blood pressure is 156/100.  Her blood pressure goal is < 130/80.  Patient Instructions: 1)  Your Hbga1c is 8.9 2)  Remember the goal is less than 7 3)  This has improved from 10 on last visit 4)  You have been given the flu vaccine today. 5)  Follow up in 3 months  - January for diabetes. 6)  Remember portion control for the holidays.   7)  Also start your exercise class  Last LDL:                                                 80 (08/20/2008 11:06:00 PM)          Diabetic Foot Exam Pulse Check          Right Foot          Left Foot Dorsalis Pedis:        normal            diminished    10-g (5.07)  Semmes-Weinstein Monofilament Test Performed by: Isla Pence          Right Foot          Left Foot Visual Inspection               Test Control      normal         normal Site 1         normal         normal Site 2         normal         normal Site 3         normal         normal Site 4         normal         normal Site 5         normal         normal Site 6         normal         normal Site 7         normal         normal Site 8         normal         normal Site 9         normal         normal Site 10         normal         normal  Impression      normal         normal    Influenza Vaccine    Vaccine Type: Fluvax 3+    Site: left deltoid    Mfr: Sanofi Pasteur    Dose: 0.5 ml    Route: IM    Given by: Isla Pence    Exp. Date: 04/01/2010    Lot #:  [id number]    VIS given: 04/26/07 version given July 29, 2009.  Flu Vaccine Consent Questions    Do you have a history of severe allergic reactions to this vaccine? no    Any prior history of allergic reactions to egg and/or gelatin? no    Do you have a sensitivity to the preservative Thimersol? no    Do you have a past history of Guillan-Barre Syndrome? no    Do you currently have an acute febrile illness? no    Have you ever had a severe reaction to latex? no    Vaccine information given and explained to patient? yes    Are you currently pregnant? no    ndc  [phone]  Appended Document: Diabetes/HTN     Allergies: 1)  ! * Actos   Complete Medication List: 1)  Lantus 100 Unit/ml Soln (Insulin glargine) .... 70 units at night for diabetes 2)  Lisinopril 40 Mg Tabs (Lisinopril) .[name].. 1 tablet by mouth daily for blood pressure 3)  Spironolactone 25 Mg Tabs (Spironolactone) .[name].. 1 tablet by mouth daily for fluid 4)  Furosemide 40 Mg Tabs (Furosemide) .[name].. 1 tablet by mouth daily for fluid 5)  Bayer Low Strength 81 Mg Tbec (Aspirin) .[name].. 1 tablet by mouth daily for circulation 6)  Naprosyn 500 Mg Tabs  (Naproxen) .[name].. 1 tablet by mouth two times a day as needed for pain 7)  Toprol Xl 200 Mg Xr24h-tab (Metoprolol succinate) .... One tablet by mouth daily for blood pressure 8)  Celexa 20 Mg Tabs (Citalopram hydrobromide) .[name].. 1 tablet by mouth daily for mood 9)  Triamcinolone Acetonide 0.1 % Oint (Triamcinolone acetonide) .[name].. 1 application topically two times a day to affected area 10)  Lipitor 20 Mg Tabs (Atorvastatin calcium) .... One tablet by mouth nightly for cholesterol 11)  Neurontin 300 Mg Caps (Gabapentin) .[name].. 1 tablet by mouth at night 12)  Novolog Mix 70/30 Flexpen 70-30 % Susp (Insulin aspart prot & aspart) .[name].. 15 units before breakfast and 15 units before dinner 13)  Pen Needles 31g X 6 Mm Misc (Insulin pen needle) .... To use with novolog insulin two times a day   Laboratory Results   Urine Tests  Date/Time Received: July 29, 2009 10:11 AM  Date/Time Reported: July 29, 2009 10:11 AM   Routine Urinalysis   Color: [name] Appearance: Clear Glucose: negative   (Normal Range: Negative) Bilirubin: negative   (Normal Range: Negative) Ketone: negative   (Normal Range: Negative) Spec. Gravity: >=1.030   (Normal Range: 1.003-1.035) Blood: negative   (Normal Range: Negative) pH: 6.0   (Normal Range: 5.0-8.0) Protein: 30   (Normal Range: Negative) Urobilinogen: 1.0   (Normal Range: 0-1) Nitrite: negative   (Normal Range: Negative) Leukocyte Esterace: negative   (Normal Range: Negative)       Laboratory Results   Urine Tests    Routine Urinalysis   Color: [name] Appearance: Clear Glucose: negative   (Normal Range: Negative) Bilirubin: negative   (Normal Range: Negative) Ketone: negative   (Normal Range: Negative) Spec. Gravity: >=1.030   (Normal Range: 1.003-1.035) Blood: negative   (Normal Range: Negative) pH: 6.0   (Normal Range: 5.0-8.0) Protein: 30   (Normal Range: Negative) Urobilinogen: 1.0   (Normal Range: 0-1) Nitrite: negative   (Normal Range:  Negative) Leukocyte Esterace: negative   (Normal Range: Negative)

## 2010-11-04 NOTE — Cardiovascular Report (Signed)
Summary: Office Visit   Office Visit   Imported By: Sallee Provencal 11/30/2009 13:09:24  _____________________________________________________________________  External Attachment:    Type:   Image     Comment:   External Document

## 2010-11-04 NOTE — Assessment & Plan Note (Signed)
Summary: Diabetes   Vital Signs:  Patient Profile:   60 Years Old Female Height:     60 inches Weight:      240 pounds Temp:     97.6 degrees F oral Pulse rate:   90 / minute Pulse rhythm:   regular Resp:     18 per minute BP sitting:   140 / 92  (left arm) Cuff size:   large  Pt. in pain?   no  Vitals Entered By: Vinetta Bergamo CMA (October 27, 2008 9:56 AM)              Is Patient Diabetic? Yes CBG Result 195 CBG Device ID A  Does patient need assistance? Functional Status Self care Ambulation Normal Comments Fasting CBG     Chief Complaint:  45mo f/u......  History of Present Illness:  Pt into the office for follow up.  Pt has cardiology appt in February.  Diabetes Management History:      The patient is a 60 years old female who comes in for evaluation of DM Type 2.  She has not been enrolled in the "Diabetic Education Program".  She states understanding of dietary principles but she is not following the appropriate diet.  No sensory loss is reported.  She is checking home blood sugars.  She says that she is not exercising regularly.        Hypoglycemic symptoms are not occurring.  No hyperglycemic symptoms are reported.  Other comments include: Lantus was increased to 60 units on last visit. She is here today for f/u.        No changes have been made to her treatment plan since last visit.    Hypertension History:      She denies headache, chest pain, and palpitations.  She notes no problems with any antihypertensive medication side effects.        Positive major cardiovascular risk factors include female age 28 years old or older, diabetes, hyperlipidemia, hypertension, and family history for ischemic heart disease (females less than 15 years old & males less than 79 years old).  Negative major cardiovascular risk factors include non-tobacco-user status.        Positive history for target organ damage include cardiac end organ damage (either CHF or LVH).  Further  assessment for target organ damage reveals no history of ASHD, stroke/TIA, peripheral vascular disease, renal insufficiency, or hypertensive retinopathy.    Lipid Management History:      Positive NCEP/ATP III risk factors include female age 99 years old or older, diabetes, family history for ischemic heart disease (females less than 52 years old & males less than 54 years old), and hypertension.  Negative NCEP/ATP III risk factors include no history of early menopause without estrogen hormone replacement, non-tobacco-user status, no ASHD (atherosclerotic heart disease), no prior stroke/TIA, no peripheral vascular disease, and no history of aortic aneurysm.        The patient states that she knows about the "Therapeutic Lifestyle Change" diet.  Her compliance with the TLC diet is poor.  She expresses no side effects from her lipid-lowering medication.  Comments: lipids checked on last visit.       Current Allergies: No known allergies      Review of Systems  CV      Denies chest pain or discomfort.  Resp      Complains of shortness of breath.      at times  GI      Denies abdominal  pain, nausea, and vomiting.   Physical Exam  General:     alert and overweight-appearing.   Head:     thin hair Lungs:     normal breath sounds.   Heart:     soft, S1S2  Abdomen:     soft, non-tender, and normal bowel sounds.   Msk:     up to exam table Neurologic:     alert & oriented X3.      Impression & Recommendations:  Problem # 1:  DM (ICD-250.00) Still uncontrolled. only slight improvement in Hba1c will d/c metformin and start novolog 70/30 - 10 units two times a day  pt to continue lantus 60units at night will give H1N1 today seasonal vaccine due today The following medications were removed from the medication list:    Glucophage 1000 Mg Tabs (Metformin hcl) .[name]... 1 tablet by mouth two times a day for blood sugar  Her updated medication list for this problem includes:     Lantus 100 Unit/ml Soln (Insulin glargine) .Marland KitchenMarland KitchenMarland KitchenMarland Kitchen 60 units at night for diabetes    Lisinopril 40 Mg Tabs (Lisinopril) .[name]... 1 tablet by mouth daily for blood pressure    Bayer Low Strength 81 Mg Tbec (Aspirin) .[name]... 1 tablet by mouth daily for circulation    Novolog Mix 70/30 Flexpen 70-30 % Susp (Insulin aspart prot & aspart) .Marland KitchenMarland KitchenMarland KitchenMarland Kitchen 10 units before breakfast and 10 units before dinner  Orders: Hemoglobin A1C (83036)   Problem # 2:  HYPERTENSION, BENIGN ESSENTIAL (ICD-401.1) advised pt to limit sodium she has not checked her blood pressure meds today Her updated medication t list for this problem includes:    Lisinopril 40 Mg Tabs (Lisinopril) .[name]... 1 tablet by mouth daily for blood pressure    Spironolactone 25 Mg Tabs (Spironolactone) .[name]... 1 tablet by mouth daily for fluid    Furosemide 40 Mg Tabs (Furosemide) .[name]... 1 tablet by mouth daily for fluid    Toprol Xl 100 Mg Tb24 (Metoprolol succinate) .[name]... 1 and 1/2 tablet by mouth daily  Her updated medication list for this problem includes:    Lisinopril 40 Mg Tabs (Lisinopril) .[name]... 1 tablet by mouth daily for blood pressure    Spironolactone 25 Mg Tabs (Spironolactone) .[name]... 1 tablet by mouth daily for fluid    Furosemide 40 Mg Tabs (Furosemide) .[name]... 1 tablet by mouth daily for fluid    Toprol Xl 100 Mg Tb24 (Metoprolol succinate) .[name]... 1 and 1/2 tablet by mouth daily   Problem # 3:  SLEEP APNEA (ICD-780.57) mild.  advised pt of dx. did not meet criteria for CPAP. advised pt to sleep on sides and most importantly lose weight  Problem # 4:  OBESITY (ICD-278.00) strongly advised increased in exercise. she needs to lose weight. this additional weight is requiring more and more medications   Problem # 5:  HYPERLIPIDEMIA (ICD-272.4) checked on last visit. stable Her updated medication list for this problem includes:    Lipitor 10 Mg Tabs (Atorvastatin calcium) .[name]... Take 2 tablets at bedtime for cholesterol   Complete  Medication List: 1)  Lantus 100 Unit/ml Soln (Insulin glargine) .... 60 units at night for diabetes 2)  Lisinopril 40 Mg Tabs (Lisinopril) .[name].. 1 tablet by mouth daily for blood pressure 3)  Spironolactone 25 Mg Tabs (Spironolactone) .[name].. 1 tablet by mouth daily for fluid 4)  Furosemide 40 Mg Tabs (Furosemide) .[name].. 1 tablet by mouth daily for fluid 5)  Bayer Low Strength 81 Mg Tbec (Aspirin) .[name].. 1 tablet by  mouth daily for circulation 6)  Naprosyn 500 Mg Tabs (Naproxen) .[name].. 1 tablet by mouth two times a day as needed for pain 7)  Toprol Xl 100 Mg Tb24 (Metoprolol succinate) .[name].. 1 and 1/2 tablet by mouth daily 8)  Celexa 20 Mg Tabs (Citalopram hydrobromide) .[name].. 1 tablet by mouth daily for mood 9)  Triamcinolone Acetonide 0.1 % Oint (Triamcinolone acetonide) .[name].. 1 application topically two times a day to affected area 10)  Lipitor 10 Mg Tabs (Atorvastatin calcium) .... Take 2 tablets at bedtime for cholesterol 11)  Darvocet-n 100 100-650 Mg Tabs (Propoxyphene n-apap) 12)  Neurontin 300 Mg Caps (Gabapentin) .[name].. 1 tablet by mouth at night 13)  Novolog Mix 70/30 Flexpen 70-30 % Susp (Insulin aspart prot & aspart) .[name].. 10 units before breakfast and 10 units before dinner 14)  Pen Needles 31g X 6 Mm Misc (Insulin pen needle) .... To use with novolog insulin two times a day  Other Orders: Capillary Blood Glucose [id number]) Fingerstick [id number])  Diabetes Management Assessment/Plan:      The following lipid goals have been established for the patient: Total cholesterol goal of 200; LDL cholesterol goal of 100; HDL cholesterol goal of 40; Triglyceride goal of 150.  Her blood pressure goal is < 130/80.    Hypertension Assessment/Plan:      The patient's hypertensive risk group is category C: Target organ damage and/or diabetes.  Her calculated 10 year risk of coronary heart disease is 15 %.  Today's blood pressure is 140/92.  Her blood pressure goal is < 130/80.  Lipid Assessment/Plan:      Based on  NCEP/ATP III, the patient's risk factor category is "history of diabetes".  From this information, the patient's calculated lipid goals are as follows: Total cholesterol goal is 200; LDL cholesterol goal is 100; HDL cholesterol goal is 40; Triglyceride goal is 150.     Patient Instructions: 1)  Your blood sugar is still uncontrolled. 2)  Good Blood sugar is very important to protect your kidneys, heart, blood vessels and other internal organs. 3)  So you will need to continue your lantus 60 units at night.  4)  STOP  the Metformin 1000mg  by mouth two times a day  5)  START Novolog 70/30 pen - 10 units before breakfast and 10 units before dinner. 6)  You will recieve the H1N1 vaccine today. 7)  Follow up in 2 months (March 2010).  Bring your blood sugar log with you.  Call this office if you have problems with your blood sugar before your next visit.   Prescriptions: PEN NEEDLES 31G X 6 MM MISC (INSULIN PEN NEEDLE) to use with novolog insulin two times a day  #100 x 5   Entered and Authorized by:   Aurora Mask FNP   Signed by:   Aurora Mask FNP on 10/27/2008   Method used:   Print then Give to Patient   RxID:   [id number] NOVOLOG MIX 70/30 FLEXPEN 70-30 % SUSP (INSULIN ASPART PROT & ASPART) 10 units before breakfast and 10 units before dinner  #1 x 5   Entered and Authorized by:   Aurora Mask FNP   Signed by:   Aurora Mask FNP on 10/27/2008   Method used:   Print then Give to Patient   RxID:   [id number]   Laboratory Results   Blood Tests     HGBA1C: 10.5%   (Normal Range: Non-Diabetic - 3-6%   Control Diabetic - 6-8%) CBG Random:: 195mg /dL  Appended Document: Diabetes        Current Allergies: No known allergies         Complete Medication List: 1)  Lantus 100 Unit/ml Soln (Insulin glargine) .... 60 units at night for diabetes 2)  Lisinopril 40 Mg Tabs (Lisinopril) .[name].. 1 tablet by mouth daily for blood pressure 3)   Spironolactone 25 Mg Tabs (Spironolactone) .[name].. 1 tablet by mouth daily for fluid 4)  Furosemide 40 Mg Tabs (Furosemide) .[name].. 1 tablet by mouth daily for fluid 5)  Bayer Low Strength 81 Mg Tbec (Aspirin) .[name].. 1 tablet by mouth daily for circulation 6)  Naprosyn 500 Mg Tabs (Naproxen) .[name].. 1 tablet by mouth two times a day as needed for pain 7)  Toprol Xl 100 Mg Tb24 (Metoprolol succinate) .[name].. 1 and 1/2 tablet by mouth daily 8)  Celexa 20 Mg Tabs (Citalopram hydrobromide) .[name].. 1 tablet by mouth daily for mood 9)  Triamcinolone Acetonide 0.1 % Oint (Triamcinolone acetonide) .[name].. 1 application topically two times a day to affected area 10)  Lipitor 10 Mg Tabs (Atorvastatin calcium) .... Take 2 tablets at bedtime for cholesterol 11)  Darvocet-n 100 100-650 Mg Tabs (Propoxyphene n-apap) 12)  Neurontin 300 Mg Caps (Gabapentin) .[name].. 1 tablet by mouth at night 13)  Novolog Mix 70/30 Flexpen 70-30 % Susp (Insulin aspart prot & aspart) .[name].. 10 units before breakfast and 10 units before dinner 14)  Pen Needles 31g X 6 Mm Misc (Insulin pen needle) .... To use with novolog insulin two times a day       H1N1 # 1    Vaccine Type: H1N1 vaccine G code    Site: right deltoid    Mfr: [place]    Dose: 0.5 ml    Route: IM    Given by: Vinetta Bergamo CMA    Exp. Date: 04/10/2010    Lot #: [id number]    VIS given: 07/03/2009 given October 27, 2008.

## 2010-11-04 NOTE — Letter (Signed)
Summary: *[facility] Results Follow up  [place], [place] [address]   Phone: [phone]  Fax: [phone]      08/21/2008   SHANTRICE KARA [address] [place], [place]  [address]   Dear  Ms. Miracle Nesby,                            ____S.Drinkard,FNP   ____D. Gore,FNP       ____B. McPherson,MD   ____V. Rankins,MD    ____E. Mulberry,MD    __X__N. Hassell Done, FNP  ____D. Jobe Igo, MD    ____K. Tomma Lightning, MD    ____Other     This letter is to inform you that your recent test(s):  _______Pap Smear    ___X____Lab Test     _______X-ray    ___X____ is within acceptable limits  _______ requires a medication change  _______ requires a follow-up lab visit  _______ requires a follow-up visit with your provider   Comments:  Labs done during recent visit were normal.  Remember that you still need better control of your blood sugar.  Increase your lantus insulin to 60 units at night and really watch your diet.       _________________________________________________________ If you have any questions, please contact our office [phone].                    Sincerely,    Aurora Mask FNP [facility]

## 2010-11-04 NOTE — Letter (Signed)
Summary: TEST ORDER FORM/DEXA SCAN  TEST ORDER FORM/DEXA SCAN   Imported By: Roland Earl 05/01/2009 12:53:03  _____________________________________________________________________  External Attachment:    Type:   Image     Comment:   External Document

## 2010-11-04 NOTE — Miscellaneous (Signed)
Summary: DEVICE PRELOAD  Clinical Lists Changes  Observations: Added new observation of ICD INDICATN: NICM (11/06/2008 14:49) Added new observation of ICDLEADSTAT1: active (11/06/2008 14:49) Added new observation of ICDLEADSER1: [id number] (11/06/2008 14:49) Added new observation of ICDLEADMOD1: 7120  (11/06/2008 14:49) Added new observation of ICDLEADDOI1: 05/04/2007  (11/06/2008 14:49) Added new observation of ICDLEADLOC1: RV  (11/06/2008 14:49) Added new observation of ICD IMP MD: Cristopher Peru, MD  (11/06/2008 14:49) Added new observation of ICD IMPL DTE: 05/04/2007  (11/06/2008 14:49) Added new observation of ICD SERL#: [id number]  (11/06/2008 14:49) Added new observation of ICD MODL#: [id number]  (11/06/2008 14:49) Added new observation of ICDMANUFACTR: [employer]  (11/06/2008 14:49) Added new observation of REFER-MD: CRENSHAW  (11/06/2008 14:49) Added new observation of CARDIO MD: Cristopher Peru, MD  (11/06/2008 14:49)      ICD Specifications Following MD:  Cristopher Peru, MD     Referring MD:  Stanford Breed ICD Vendor:  [employer]     ICD Model Number:  [phone]     ICD Serial Number:  [id number] ICD DOI:  05/04/2007     ICD Implanting MD:  Cristopher Peru, MD  Lead 1:    Location: RV     DOI: 05/04/2007     Model #: [id number]     Serial #: [id number]     Status: active  Indications::  NICM

## 2010-11-04 NOTE — Assessment & Plan Note (Signed)
Summary: Diabetes/HTN   Vital Signs:  Patient profile:   60 year old female Height:      60 inches Weight:      242 pounds Temp:     97.5 degrees F oral Pulse rate:   60 / minute Pulse rhythm:   regular Resp:     18 per minute BP sitting:   122 / 86  (right arm) Cuff size:   large  Vitals Entered By: [place] Shannon (December 25, 2008 8:51 AM) CC: Hypertension Management, Lipid Management Is Patient Diabetic? Yes  Pain Assessment Patient in pain? yes     Location: right leg Intensity: 6 Type: sharp Onset of pain  Constant   History of Present Illness:  Pt into the  office for follow up.  Diabetes -  pt presents with her blood sugar log. Insulin was changed during her last visit.  She reports that she is taking medications according to the schedule.  Values appear to be doing better than previously. No hypoglycemic episodes   Hypertension History:      She denies headache, chest pain, and palpitations.  Pt has been seen by the cardiologist.  Her blood pressure medications were increased - Toprol 200mg .  .        Positive major cardiovascular risk factors include female age 57 years old or older, diabetes, hyperlipidemia, hypertension, and family history for ischemic heart disease (females less than 68 years old & males less than 12 years old).  Negative major cardiovascular risk factors include non-tobacco-user status.        Positive history for target organ damage include cardiac end organ damage (either CHF or LVH).  Further assessment for target organ damage reveals no history of ASHD, stroke/TIA, peripheral vascular disease, renal insufficiency, or hypertensive retinopathy.    Lipid Management History:      Positive NCEP/ATP III risk factors include female age 71 years old or older, diabetes, family history for ischemic heart disease (females less than 67 years old & males less than 66 years old), and hypertension.  Negative NCEP/ATP III risk factors include no history of  early menopause without estrogen hormone replacement, non-tobacco-user status, no ASHD (atherosclerotic heart disease), no prior stroke/TIA, no peripheral vascular disease, and no history of aortic aneurysm.        The patient states that she does not know about the "Therapeutic Lifestyle Change" diet.  Her compliance with the TLC diet is poor.  She expresses no side effects from her lipid-lowering medication.  The patient denies any symptoms to suggest myopathy or liver disease.  Comments: pt is still taking her crestor 5mg  as she has been waiting for the ICP meds to come.  She presents today with lipitor 20mg .     Current Medications (verified): 1)  Lantus 100 Unit/ml  Soln (Insulin Glargine) .... 60 Units At Night For Diabetes 2)  Lisinopril 40 Mg  Tabs (Lisinopril) .[name].. 1 Tablet By Mouth Daily For Blood Pressure 3)  Spironolactone 25 Mg  Tabs (Spironolactone) .[name].. 1 Tablet By Mouth Daily For Fluid 4)  Furosemide 40 Mg  Tabs (Furosemide) .[name].. 1 Tablet By Mouth Daily For Fluid 5)  Bayer Low Strength 81 Mg  Tbec (Aspirin) .[name].. 1 Tablet By Mouth Daily For Circulation 6)  Naprosyn 500 Mg  Tabs (Naproxen) .[name].. 1 Tablet By Mouth Two Times A Day As Needed For Pain 7)  Toprol Xl 100 Mg  Tb24 (Metoprolol Succinate) .[name].. 1 and 1/2 Tablet By Mouth Daily 8)  Celexa  20 Mg  Tabs (Citalopram Hydrobromide) .[name].. 1 Tablet By Mouth Daily For Mood 9)  Triamcinolone Acetonide 0.1 %  Oint (Triamcinolone Acetonide) .[name].. 1 Application Topically Two Times A Day To Affected Area 10)  Lipitor 10 Mg  Tabs (Atorvastatin Calcium) .... Take 2 Tablets At Bedtime For Cholesterol 11)  Darvocet-N 100 100-650 Mg Tabs (Propoxyphene N-Apap) 12)  Neurontin 300 Mg  Caps (Gabapentin) .[name].. 1 Tablet By Mouth At Night 13)  Novolog Mix 70/30 Flexpen 70-30 % Susp (Insulin Aspart Prot & Aspart) .[name].. 10 Units Before Breakfast and 10 Units Before Dinner 14)  Pen Needles 31g X 6 Mm Misc (Insulin Pen Needle) .... To Use With Novolog Insulin  Two Times A Day  Allergies (verified): No Known Drug Allergies  Review of Systems CV:  Denies chest pain or discomfort. Resp:  Complains of shortness of breath; denies cough; at times. GI:  Denies abdominal pain, nausea, and vomiting. MS:  Complains of joint redness; leg pain.  Physical Exam  General:  alert.  obese Head:  normocephalic.  wig Eyes:  glasses Lungs:  normal breath sounds.   Heart:  soft, S1S2  Abdomen:  obese nontender BS x 4 Msk:  up to the exam table Extremities:  trace left pedal edema and trace right pedal edema.   Neurologic:  alert & oriented X3.   Skin:  color normal.   Psych:  Oriented X3.  talkative  Diabetes Management Exam:    Foot Exam (with socks and/or shoes not present):       Sensory-Monofilament:          Left foot: normal          Right foot: normal   Impression & Recommendations:  Problem # 1:  DM (ICD-250.00) Pt is doing well with new insulin regimen continue. Her updated medication list for this problem includes:    Lantus 100 Unit/ml Soln (Insulin glargine) .Marland KitchenMarland KitchenMarland KitchenMarland Kitchen 60 units at night for diabetes    Lisinopril 40 Mg Tabs (Lisinopril) .[name]... 1 tablet by mouth daily for blood pressure    Bayer Low Strength 81 Mg Tbec (Aspirin) .[name]... 1 tablet by mouth daily for circulation    Novolog Mix 70/30 Flexpen 70-30 % Susp (Insulin aspart prot & aspart) .Marland KitchenMarland KitchenMarland KitchenMarland Kitchen 10 units before breakfast and 10 units before dinner  Problem # 2:  HYPERTENSION, BENIGN ESSENTIAL (ICD-401.1) stable meds changed per cardiology Her updated medication list for this problem includes:    Lisinopril 40 Mg Tabs (Lisinopril) .[name]... 1 tablet by mouth daily for blood pressure    Spironolactone 25 Mg Tabs (Spironolactone) .[name]... 1 tablet by mouth daily for fluid    Furosemide 40 Mg Tabs (Furosemide) .[name]... 1 tablet by mouth daily for fluid    Toprol Xl 200 Mg Xr24h-tab (Metoprolol succinate) ..... One tablet by mouth daily for blood pressure  Problem # 3:  HYPERLIPIDEMIA  (ICD-272.4) will check cholesterol on next visit. Her updated medication list for this problem includes:    Lipitor 20 Mg Tabs (Atorvastatin calcium) ..... One tablet by mouth nightly for cholesterol  Problem # 4:  OBESITY (ICD-278.00) advised pt that she really needs to increase activity up 2 pounds since the last visit  Complete Medication List: 1)  Lantus 100 Unit/ml Soln (Insulin glargine) .... 60 units at night for diabetes 2)  Lisinopril 40 Mg Tabs (Lisinopril) .[name].. 1 tablet by mouth daily for blood pressure 3)  Spironolactone 25 Mg Tabs (Spironolactone) .[name].. 1 tablet by mouth daily for fluid 4)  Furosemide  40 Mg Tabs (Furosemide) .[name].. 1 tablet by mouth daily for fluid 5)  Bayer Low Strength 81 Mg Tbec (Aspirin) .[name].. 1 tablet by mouth daily for circulation 6)  Naprosyn 500 Mg Tabs (Naproxen) .[name].. 1 tablet by mouth two times a day as needed for pain 7)  Toprol Xl 200 Mg Xr24h-tab (Metoprolol succinate) .... One tablet by mouth daily for blood pressure 8)  Celexa 20 Mg Tabs (Citalopram hydrobromide) .[name].. 1 tablet by mouth daily for mood 9)  Triamcinolone Acetonide 0.1 % Oint (Triamcinolone acetonide) .[name].. 1 application topically two times a day to affected area 10)  Lipitor 20 Mg Tabs (Atorvastatin calcium) .... One tablet by mouth nightly for cholesterol 11)  Darvocet-n 100 100-650 Mg Tabs (Propoxyphene n-apap) 12)  Neurontin 300 Mg Caps (Gabapentin) .[name].. 1 tablet by mouth at night 13)  Novolog Mix 70/30 Flexpen 70-30 % Susp (Insulin aspart prot & aspart) .[name].. 10 units before breakfast and 10 units before dinner 14)  Pen Needles 31g X 6 Mm Misc (Insulin pen needle) .... To use with novolog insulin two times a day  Hypertension Assessment/Plan:      The patient's hypertensive risk group is category C: Target organ damage and/or diabetes.  Her calculated 10 year risk of coronary heart disease is 11 %.  Today's blood pressure is 122/86.  Her blood pressure goal is < 130/80.  Lipid  Assessment/Plan:      Based on NCEP/ATP III, the patient's risk factor category is "history of diabetes".  From this information, the patient's calculated lipid goals are as follows: Total cholesterol goal is 200; LDL cholesterol goal is 100; HDL cholesterol goal is 40; Triglyceride goal is 150.    Patient Instructions: 1)  Continue the insulin at the current dose. 2)  A1C = 9.5 today which is improved from last visit. 3)  Please Please Please start to exercise and walk.  This would help alot with your blood sugar  4)  Follow up in 3 months (June 2010) or sooner if necessary. 5)  Continue to monitor blood sugars Prescriptions: BAYER LOW STRENGTH 81 MG  TBEC (ASPIRIN) 1 tablet by mouth daily for circulation  #30 x 5   Entered and Authorized by:   Aurora Mask FNP   Signed by:   Aurora Mask FNP on 12/25/2008   Method used:   Printed then faxed to ...       [facility] (retail)       [place], [place]  [address]       Ph: [phone]       Fax: [phone]   RxID:   [id number] LANTUS 100 UNIT/ML  SOLN (INSULIN GLARGINE) 60 units at night for diabetes  #1 month qs x 5   Entered and Authorized by:   Aurora Mask FNP   Signed by:   Aurora Mask FNP on 12/25/2008   Method used:   Printed then faxed to ...       [facility] (retail)       [place], [place]  [address]       Ph: [phone]       Fax: [phone]   RxID:   [id number] LIPITOR 20 MG TABS (ATORVASTATIN CALCIUM) One tablet by mouth nightly for cholesterol  #30 x 5   Entered and Authorized by:   Aurora Mask FNP  Signed by:   Aurora Mask FNP on 12/25/2008   Method used:   Printed then faxed to ...       [facility] (retail)       [place], [place]  [address]       Ph: [phone]       Fax: [phone]   RxID:    [id number]       Laboratory Results   Blood Tests   Date/Time Received: December 25, 2008 8:56 AM  Date/Time Reported: December 25, 2008 8:57 AM   HGBA1C: 9.5%   (Normal Range: Non-Diabetic - 3-6%   Control Diabetic - 6-8%) CBG Fasting:: 85mg /dL       Last LDL:                                                 80 (08/20/2008 11:06:00 PM)        Diabetic Foot Exam    10-g (5.07) Semmes-Weinstein Monofilament Test Performed by: Thailand Shannon          Right Foot          Left Foot Visual Inspection               Test Control      normal         normal Site 1         normal         normal Site 2         normal         normal Site 3         normal         normal Site 4         normal         normal Site 5         normal         normal Site 6         normal         normal Site 7         normal         normal Site 8         normal         normal Site 9         normal         normal Site 10         normal         normal  Impression      normal         normal    Vital Signs:  Patient Profile:   60 year old female Height:     60 inches Weight:      242 pounds Temp:     97.5 degrees F oral Pulse rate:   60 / minute Pulse rhythm:   regular Resp:     18 per minute BP sitting:   122 / 86 Cuff size:   large    Location:   right leg    Intensity:   6    Type:       sharp                Laboratory Results   Blood Tests  HGBA1C: 9.5%   (Normal Range: Non-Diabetic - 3-6%   Control Diabetic - 6-8%) CBG Fasting: 85

## 2010-11-04 NOTE — Letter (Signed)
Summary: Handout Printed  Printed Handout:  - Ear - Swimmer's (Otitis Externa)

## 2010-11-04 NOTE — Letter (Signed)
Summary: [facility]   Imported By: Roland Earl 11/16/2007 10:26:39  _____________________________________________________________________  External Attachment:    Type:   Image     Comment:   External Document

## 2010-11-04 NOTE — Letter (Signed)
Summary: FAXED REQUESTED RECORDS TO [facility] 01/03/08  FAXED REQUESTED RECORDS TO [facility] 01/03/08   Imported By: Adela Lank [place] 01/03/2008 14:01:05  _____________________________________________________________________  External Attachment:    Type:   Image     Comment:   External Document

## 2010-11-04 NOTE — Procedures (Signed)
Summary: DEVICE/SAF   Current Medications (verified): 1)  Lantus 100 Unit/ml  Soln (Insulin Glargine) .... 70 Units At Night For Diabetes 2)  Lisinopril 40 Mg  Tabs (Lisinopril) .[name].. 1 Tablet By Mouth Daily For Blood Pressure 3)  Spironolactone 25 Mg  Tabs (Spironolactone) .[name].. 1 Tablet By Mouth Daily For Fluid 4)  Furosemide 40 Mg  Tabs (Furosemide) .[name].. 1 Tablet By Mouth Daily For Fluid 5)  Bayer Low Strength 81 Mg  Tbec (Aspirin) .[name].. 1 Tablet By Mouth Daily For Circulation 6)  Toprol Xl 200 Mg Xr24h-Tab (Metoprolol Succinate) .... One Tablet By Mouth Daily For Blood Pressure 7)  Celexa 20 Mg  Tabs (Citalopram Hydrobromide) .[name].. 1 Tablet By Mouth Daily For Mood 8)  Triamcinolone Acetonide 0.1 %  Oint (Triamcinolone Acetonide) .[name].. 1 Application Topically Two Times A Day To Affected Area 9)  Lipitor 20 Mg Tabs (Atorvastatin Calcium) .... One Tablet By Mouth Nightly For Cholesterol 10)  Neurontin 300 Mg  Caps (Gabapentin) .[name].. 1 Tablet By Mouth At Night 11)  Novolog Mix 70/30 Flexpen 70-30 % Susp (Insulin Aspart Prot & Aspart) .[name].. 15 Units Before Breakfast and 15 Units Before Dinner 12)  Pen Needles 31g X 6 Mm Misc (Insulin Pen Needle) .... To Use With Novolog Insulin Two Times A Day 13)  Ultram 50 Mg Tabs (Tramadol Hcl) .[name].. 1-2 Tablet By Mouth Two Times A Day As Needed For Pain 14)  Ventolin Hfa 108 (90 Base) Mcg/act Aers (Albuterol Sulfate) .... 2 Puffs Every 6 Hours As Needed For Shortness of Breath  Allergies (verified): 1)  ! * Actos 2)  ! Hydrocortisone (Hydrocortisone)   ICD Specifications Following MD:  Cristopher Peru, MD     ICD Vendor:  [employer]     ICD Model Number:  [phone]     ICD Serial Number:  [id number] ICD DOI:  05/04/2007     ICD Implanting MD:  Cristopher Peru, MD  Lead 1:    Location: RV     DOI: 05/04/2007     Model #: [id number]     Serial #: [id number]     Status: active  Indications::  NICM   ICD Follow Up Remote Check?  No Battery Voltage:  3.09 V     Charge Time:  10.5  seconds     Underlying rhythm:  SR ICD Dependent:  No       ICD Device Measurements Right Ventricle:  Amplitude: 11 mV, Impedance: 475 ohms, Threshold: 0.5 V at 0.5 msec Shock Impedance: 40 ohms   Episodes Coumadin:  No Shock:  0     ATP:  0     Nonsustained:  0     Ventricular Pacing:  <1%  Brady Parameters Mode VVI     Lower Rate Limit:  40      Tachy Zones VF:  240     VT:  200     Next Cardiology Appt Due:  02/01/2010 Tech Comments:  Normal device function. No changes made today.  Pt prefers office visits to [employer].  ROV 3 months clinic.Chanetta Marshall RN BSN  November 09, 2009 10:12 AM  MD Comments:  Agree with above.

## 2010-11-04 NOTE — Cardiovascular Report (Signed)
Summary: Office Visit  Office Visit   Imported By: Sallee Provencal 08/24/2009 10:50:03  _____________________________________________________________________  External Attachment:    Type:   Image     Comment:   External Document

## 2010-11-04 NOTE — Assessment & Plan Note (Signed)
Summary: Diabetes/Lipoma/HTN   Vital Signs:  Patient Profile:   60 Years Old Female Weight:      231 pounds Temp:     98.3 degrees F Pulse rate:   100 / minute Pulse rhythm:   regular Resp:     20 per minute BP sitting:   148 / 98  (left arm) Cuff size:   large  Pt. in pain?   yes    Location:   right leg/thigh    Intensity:   7  Vitals Entered By: Shellia Carwin CMA (August 01, 2007 11:02 AM)              Is Patient Diabetic? Yes  CBG Result 380  Does patient need assistance? Ambulation Normal Comments pt did not bring meds unsure of all the names was able to tell me the majority     Chief Complaint:  area of fat on right thigh causing some pain for her.  Has been there a long time and not bothered her before.  History of Present Illness:  Pt notes that she has an area on her right thigh that has been increasingly causing her pain.  She reports that she had it evaluated some years ago and was told that it was a fatty pad.  Diabetes - pt reports that blood sugars have been slighty elevated over the past week.  she is taking her glucophage and insulin as ordered.  Pt reports that she forgot to take her dose of insulin at night.  She is taking 14 units of lantus.  Pt reports that she has trouble remembering to take the meds.  pt is not employed at this time.  She previously was employed as a [employer] manager.  Pt reports that she has filed for disability as she is unable to work at this time.  Cardiomyopathy - s/p ICD placement in August. pt reports that the area is still a little tender. she goes to cardiologist next month.  she has recieved notification from the health department that it is time to get her mammogram.  Informed pt that it is ok to have done when they are able to schedule her.  Diabetes Management History:      She states understanding of dietary principles but she is not following the appropriate diet.  No sensory loss is reported.  Self foot exams are not being  performed.  She is checking home blood sugars.  She says that she is not exercising regularly.        Reported hypoglycemic symptoms include weakness.  No hyperglycemic symptoms are reported.    Hypertension History:      She denies headache, chest pain, and palpitations.  She notes no problems with any antihypertensive medication side effects.        Positive major cardiovascular risk factors include diabetes, hyperlipidemia, and hypertension.  Negative major cardiovascular risk factors include female age less than 19 years old and non-tobacco-user status.        Positive history for target organ damage include cardiac end organ damage (either CHF or LVH).       Current Allergies (reviewed today): No known allergies     Risk Factors:  Exercise:  no   Review of Systems  General      Denies chills, fatigue, and fever.  CV      Denies fainting and palpitations.  Resp      Denies chest discomfort, cough, and shortness of breath.  GI  Denies abdominal pain, diarrhea, nausea, and vomiting.  GU      Denies abnormal vaginal bleeding, decreased libido, discharge, dysuria, genital sores, hematuria, incontinence, nocturia, urinary frequency, and urinary hesitancy.  MS      right thigh pain  Derm      left shin healing ulcer   Physical Exam  General:     alert and well-developed.   Head:     normocephalic.   Eyes:     pupils round.  glasses Ears:     R ear normal and L ear normal.   Nose:     no external deformity.   Neck:     supple.   Lungs:     normal respiratory effort, no crackles, and no wheezes.   Heart:     normal rate, regular rhythm, and no murmur.   Abdomen:     soft, non-tender, and normal bowel sounds.  obese Msk:     decreased ROM in bil lower ext right outer thiigh with lipoma - large 15cm x 12cm obese legs with large amount of Subcutaneously tissue so diffcult to determine exact size slighlty tender with palpation but no discoloration of  external skin Pulses:     pedal pulses +2 Extremities:     no pedal edma Neurologic:     alert & oriented X3.   Skin:     left skin with healing ulcer Psych:     Oriented X3.      Impression & Recommendations:  Problem # 1:  LIPOMA (ICD-214.9) Warm compresses to affected area. pt to take naprosyn 500mg  two times a day as needed for pain. she is to take two times a day x 3 days then as needed. She will monitor the area for any change. May need to refer for removal but cautioned pt that this would be done only after conservative management.   Problem # 2:  DM (ICD-250.00) Encouraged pt not to skip doses of insulin.  She needs to record blood sugar daily and bring values back into the office. Her updated medication list for this problem includes:    Glucophage 1000 Mg Tabs (Metformin hcl) .[name]... 1 tablet by mouth two times a day for blood sugar    Lantus 100 Unit/ml Soln (Insulin glargine) .[name]... 14 units at night for diabetes    Lisinopril 20 Mg Tabs (Lisinopril) .[name]... 1 tablet by mouth two times a day for blood pressure    Bayer Low Strength 81 Mg Tbec (Aspirin) .[name]... 1 tablet by mouth daily for circulation   Problem # 3:  CONGESTIVE HEART FAILURE, SYSTOLIC DYSFUNCTION (A999333) Pt has a pacer placed.  She has cardiology appt on next month. Her updated medication list for this problem includes:    Lisinopril 20 Mg Tabs (Lisinopril) .[name]... 1 tablet by mouth two times a day for blood pressure    Spironolactone 25 Mg Tabs (Spironolactone) .[name]... 1 tablet by mouth daily for fluid    Furosemide 40 Mg Tabs (Furosemide) .[name]... 1 tablet by mouth daily for fluid    Metoprolol Tartrate 50 Mg Tabs (Metoprolol tartrate) .[name]... 1 1/2 tablets by mouth daily for blood pressure    Bayer Low Strength 81 Mg Tbec (Aspirin) .[name]... 1 tablet by mouth daily for circulation   Problem # 4:  HYPERTENSION, BENIGN ESSENTIAL (ICD-401.1)  Her updated medication list for this problem includes:    Lisinopril  20 Mg Tabs (Lisinopril) .[name]... 1 tablet by mouth two times a day for blood pressure  Spironolactone 25 Mg Tabs (Spironolactone) .[name]... 1 tablet by mouth daily for fluid    Furosemide 40 Mg Tabs (Furosemide) .[name]... 1 tablet by mouth daily for fluid    Metoprolol Tartrate 50 Mg Tabs (Metoprolol tartrate) .[name]... 1 1/2 tablets by mouth daily for blood pressure   Complete Medication List: 1)  Glucophage 1000 Mg Tabs (Metformin hcl) .[name].. 1 tablet by mouth two times a day for blood sugar 2)  Lantus 100 Unit/ml Soln (Insulin glargine) .[name].. 14 units at night for diabetes 3)  Lisinopril 20 Mg Tabs (Lisinopril) .[name].. 1 tablet by mouth two times a day for blood pressure 4)  Simvastatin 40 Mg Tabs (Simvastatin) .[name].. 1 tablet by mouth at night for cholesterol 5)  Spironolactone 25 Mg Tabs (Spironolactone) .[name].. 1 tablet by mouth daily for fluid 6)  Furosemide 40 Mg Tabs (Furosemide) .[name].. 1 tablet by mouth daily for fluid 7)  Metoprolol Tartrate 50 Mg Tabs (Metoprolol tartrate) .[name].. 1 1/2 tablets by mouth daily for blood pressure 8)  Bayer Low Strength 81 Mg Tbec (Aspirin) .[name].. 1 tablet by mouth daily for circulation 9)  Naprosyn 500 Mg Tabs (Naproxen) .[name].. 1 tablet by mouth two times a day as needed for pain  Other Orders: Capillary Blood Glucose [id number]) Fingerstick [id number])  CHF Assessment/Plan:      The patient's current weight is 231 pounds.  Her previous weight was 231 pounds.    Diabetes Management Assessment/Plan:      The following lipid goals have been established for the patient: Total cholesterol goal of 200; LDL cholesterol goal of 100; HDL cholesterol goal of 40; Triglyceride goal of 200.  Her blood pressure goal is < 130/80.    Hypertension Assessment/Plan:      The patient's hypertensive risk group is category C: Target organ damage and/or diabetes.  Today's blood pressure is 148/98.  Her blood pressure goal is < 130/80.   Patient Instructions: 1)  take naprosyn 500mg  two times a day for 3  days then only as needed. 2)  Warm compresses three times daily to right thigh 3)  record blood sugars daily and bring book to office on your next visit. 4)  Please schedule a follow-up appointment in 1 month for diabetes, high blood pressure and lipoma 5)  **Make appointment after November 18th**    Prescriptions: NAPROSYN 500 MG  TABS (NAPROXEN) 1 tablet by mouth two times a day as needed for pain  #30 x 0   Entered and Authorized by:   Aurora Mask FNP   Signed by:   Aurora Mask FNP on 08/01/2007   Method used:   Print then Give to Patient   [id number]  ]

## 2010-11-04 NOTE — Assessment & Plan Note (Signed)
Summary: follow up in 2 wks for blood pressure check//////gk  Nurse Visit   Vital Signs:  Patient profile:   60 year old female Menstrual status:  hysterectomy BP sitting:   132 / 90  (left arm) Cuff size:   large per [facility] said pt BP was good today....... [name]  March 26, 2009 9:31 AM  CC: pt says she is now drinking diet 7up.... pt says her and her grandkids walk around the mall...Marland KitchenMarland KitchenMarland Kitchen  Does patient need assistance? Ambulation Normal    Allergies: 1)  ! * Actos

## 2010-11-04 NOTE — Assessment & Plan Note (Signed)
Summary: F/u ER visit - Reactive Airway   Vital Signs:  Patient profile:   60 year old female Menstrual status:  hysterectomy Weight:      243.6 pounds BSA:     2.03 O2 Sat:      98 % on RA Temp:     97.1 degrees F oral Pulse rate:   70 / minute Pulse rhythm:   regular Resp:     24 per minute BP sitting:   104 / 75  (right arm) Cuff size:   large  Vitals Entered By: Bridgett Larsson RN (October 06, 2009 8:30 AM)  O2 Flow:  RA  Serial Vital Signs/Assessments:                                PEF    PreRx  PostRx Time      O2 Sat  O2 Type     L/min  L/min  L/min   By 8:47 AM   98  %   RA                                Aurora Mask FNP  Comments: 8:47 AM Peak Flow - 100, 110, 110 coughing By: Aurora Mask FNP   CC: F/U SOB cough seen in ER 10/04/09 has not gotten Prendisone filled Is Patient Diabetic? Yes Did you bring your meter with you today? No Pain Assessment Patient in pain? no      Nutritional Status Detail non-fasting CBG Result 218 CBG Device ID B  Does patient need assistance? Functional Status Self care Ambulation Normal   Primary Care Provider:  Aurora Mask FNP  CC:  F/U SOB cough seen in ER 10/04/09 has not gotten Prendisone filled.  History of Present Illness:  Pt into the office for F/u on ER visit on 10/04/2009. Pt was seen on 12/31/02010 in this office and was given an Flovent inhaler and has been taking 2 puffs in the morning and 2 puffs at night. She had increasing shortness of breath and went to the ER. Dx with reactive Airway and Bronchitis CXRAY and CT done  Asthma History    Initial Asthma Severity Rating:    Age range: 12+ years    Symptoms: throughout the day    Nighttime Awakenings: often 7/week    Interferes w/ normal activity: some limitations    Exacerbations requiring oral systemic steroids: 0-1/year    Asthma Severity Assessment: Severe Persistent    Habits & Providers  Alcohol-Tobacco-Diet     Alcohol  drinks/day: 0     Tobacco Status: quit < 6 months     Tobacco Counseling: to remain off tobacco products     Cigarette Packs/Day: 2-3cig     Year Quit: 10/2007  Exercise-Depression-Behavior     Does Patient Exercise: no     Exercise Counseling: to improve exercise regimen     Type of exercise: walking     Exercise (avg: min/session): <30     Times/week: <3     Depression Counseling: not indicated; screening negative for depression     Drug Use: no     Seat Belt Use: 100     Sun Exposure: occasionally  Current Medications (verified): 1)  Lantus 100 Unit/ml  Soln (Insulin Glargine) .... 70 Units At Night For Diabetes 2)  Lisinopril 40 Mg  Tabs (Lisinopril) .Marland KitchenMarland KitchenMarland Kitchen 1  Tablet By Mouth Daily For Blood Pressure 3)  Spironolactone 25 Mg  Tabs (Spironolactone) .[name].. 1 Tablet By Mouth Daily For Fluid 4)  Furosemide 40 Mg  Tabs (Furosemide) .[name].. 1 Tablet By Mouth Daily For Fluid 5)  Bayer Low Strength 81 Mg  Tbec (Aspirin) .[name].. 1 Tablet By Mouth Daily For Circulation 6)  Toprol Xl 200 Mg Xr24h-Tab (Metoprolol Succinate) .... One Tablet By Mouth Daily For Blood Pressure 7)  Celexa 20 Mg  Tabs (Citalopram Hydrobromide) .[name].. 1 Tablet By Mouth Daily For Mood 8)  Triamcinolone Acetonide 0.1 %  Oint (Triamcinolone Acetonide) .[name].. 1 Application Topically Two Times A Day To Affected Area 9)  Lipitor 20 Mg Tabs (Atorvastatin Calcium) .... One Tablet By Mouth Nightly For Cholesterol 10)  Neurontin 300 Mg  Caps (Gabapentin) .[name].. 1 Tablet By Mouth At Night 11)  Novolog Mix 70/30 Flexpen 70-30 % Susp (Insulin Aspart Prot & Aspart) .[name].. 15 Units Before Breakfast and 15 Units Before Dinner 12)  Pen Needles 31g X 6 Mm Misc (Insulin Pen Needle) .... To Use With Novolog Insulin Two Times A Day 13)  Flovent Hfa 44 Mcg/act Aero (Fluticasone Propionate  Hfa) .... Two Puffs Two Times A Day  Allergies: 1)  ! * Actos 2)  ! Hydrocortisone (Hydrocortisone)  Social History: Does Patient Exercise:  no  Review of  Systems CV:  Denies chest pain or discomfort. Resp:  Complains of cough, shortness of breath, and wheezing. GI:  Denies abdominal pain.  Physical Exam  General:  alert.  obese Head:  normocephalic.   Lungs:  coarse wheezes throughout Heart:  normal rate and regular rhythm.   Abdomen:  normal bowel sounds.   Msk:  up to the exam table Neurologic:  alert & oriented X3.     Impression & Recommendations:  Problem # 1:  REACTIVE AIRWAY DISEASE (ICD-493.90) will schedule PFT. Albuterol and atrovent neb given pt instructed to start prednisone as ordered by the ER continue flovent Her updated medication list for this problem includes:    Flovent Hfa 44 Mcg/act Aero (Fluticasone propionate  hfa) .[name]..[name] Two puffs two times a day    Prednisone 50 Mg Tabs (Prednisone) ..... One tablet by mouth daily for 5 days  Orders: PFT Basline & Lung Volume (PFT Baseline-Lung  V) Atrovent 1mg  (Neb) [phone]) Albuterol Sulfate Sol 1mg  unit dose [id number]) Depo- Medrol 80mg  (J1040) Admin of Therapeutic Inj  intramuscular or subcutaneous [id number]) Admin of Therapeutic Inj  intramuscular or subcutaneous [id number])  Complete Medication List: 1)  Lantus 100 Unit/ml Soln (Insulin glargine) .... 70 units at night for diabetes 2)  Lisinopril 40 Mg Tabs (Lisinopril) .[name].. 1 tablet by mouth daily for blood pressure 3)  Spironolactone 25 Mg Tabs (Spironolactone) .[name].. 1 tablet by mouth daily for fluid 4)  Furosemide 40 Mg Tabs (Furosemide) .[name].. 1 tablet by mouth daily for fluid 5)  Bayer Low Strength 81 Mg Tbec (Aspirin) .[name].. 1 tablet by mouth daily for circulation 6)  Toprol Xl 200 Mg Xr24h-tab (Metoprolol succinate) .... One tablet by mouth daily for blood pressure 7)  Celexa 20 Mg Tabs (Citalopram hydrobromide) .[name].. 1 tablet by mouth daily for mood 8)  Triamcinolone Acetonide 0.1 % Oint (Triamcinolone acetonide) .[name].. 1 application topically two times a day to affected area 9)  Lipitor 20 Mg Tabs (Atorvastatin calcium)  .... One tablet by mouth nightly for cholesterol 10)  Neurontin 300 Mg Caps (Gabapentin) .[name].. 1 tablet by mouth at night 11)  Novolog Mix 70/30 Flexpen  70-30 % Susp (Insulin aspart prot & aspart) .[name].. 15 units before breakfast and 15 units before dinner 12)  Pen Needles 31g X 6 Mm Misc (Insulin pen needle) .... To use with novolog insulin two times a day 13)  Flovent Hfa 44 Mcg/act Aero (Fluticasone propionate  hfa) .... Two puffs two times a day 14)  Ultram 50 Mg Tabs (Tramadol hcl) .[name].. 1-2 tablet by mouth two times a day as needed for pain 15)  Prednisone 50 Mg Tabs (Prednisone) .... One tablet by mouth daily for 5 days  Other Orders: Capillary Blood Glucose/CBG [id number])  Asthma Management Plan    Asthma Severity: Severe Persistent    Personal best PEF: 110 liters/minute    Predicted PEF: 440 liters/minute    Working PEF: 440 liters/minute    Plan based on PEF formula: [employer] Zone: (Range: 350 to 440) FLOVENT HFA 44 MCG/ACT AERO  Yellow Zone: PREDNISONE 50 MG TABS  Red Zone: PREDNISONE 50 MG TABS Call your physician for shortness of breath.    Patient Instructions: 1)  Take the Prednisone as ordered by the ER. 2)  Continue to use the Flovent - 2 puffs two times a day  3)  Keep your appointment for Pulmonary Function test. 4)  Depending on these results you will be sent to the  Specialist 5)  Follow up with n.martin, fnp 1 week after pulmonary function test or sooner if symptoms continue or worsen Prescriptions: PREDNISONE 50 MG TABS (PREDNISONE) One tablet by mouth daily for 5 days  #5 x 0   Entered and Authorized by:   Aurora Mask FNP   Signed by:   Aurora Mask FNP on 10/06/2009   Method used:   Faxed to ...       [facility] (retail)       [place], [place]  [address]       Ph: [phone] Kaplan       Fax: [phone]   RxID:   [id number] ULTRAM 50 MG TABS (TRAMADOL HCL) 1-2 tablet by  mouth two times a day as needed for pain  #50 x 0   Entered and Authorized by:   Aurora Mask FNP   Signed by:   Aurora Mask FNP on 10/06/2009   Method used:   Faxed to ...       [facility] (retail)       [place], [place]  [address]       Ph: [phone]       Fax: [phone]   RxID:   [id number]    CXR  Procedure date:  10/04/2009  Findings:      Borderline cardiomegaly without overt congestive failure Apparent pulmonary intersitial prominence, likely related to low lung volume and AP portable technique possible right base atelectasis  CT of Chest  Procedure date:  10/04/2009  Findings:      Patchy ground-glass opacifications in both lungs, right side greater than left.  Findings may represent vascular congestion or edema.  In addition, there are small scattered nodules in both lungs.  Thses findings are indeterminate. As a results an infectious etiology cannot be completely excluded.  Recommended clincal correlation and follow up chest CT in 3-6 months    CXR  Procedure date:  10/04/2009  Findings:      Borderline cardiomegaly without overt congestive failure  Apparent pulmonary intersitial prominence, likely related to low lung volume and AP portable technique possible right base atelectasis  CT of Chest  Procedure date:  10/04/2009  Findings:      Patchy ground-glass opacifications in both lungs, right side greater than left.  Findings may represent vascular congestion or edema.  In addition, there are small scattered nodules in both lungs.  Thses findings are indeterminate. As a results an infectious etiology cannot be completely excluded.  Recommended clincal correlation and follow up chest CT in 3-6 months   Medication Administration  Injection # 3:    Medication: Depo- Medrol 80mg     Diagnosis: REACTIVE AIRWAY DISEASE (ICD-493.90)    Route: IM    Site: RUOQ gluteus    Exp Date:  05/2010    Lot #: [id number]    Mfr: Pharmacia    Patient tolerated injection without complications    Given by: Isla Pence (October 06, 2009 9:37 AM)  Medication # 1:    Medication: Atrovent 1mg  (Neb)    Diagnosis: REACTIVE AIRWAY DISEASE (ICD-493.90)    Dose: 0.5mg     Route: po    Exp Date: 08/12    Lot #: [id number]    Mfr: [email or web address]    Patient tolerated medication without complications    Given by: Isla Pence (October 06, 2009 9:30 AM)  Medication # 2:    Medication: Albuterol Sulfate Sol 1mg  unit dose    Diagnosis: REACTIVE AIRWAY DISEASE (ICD-493.90)    Dose: 2.5mg     Route: po    Exp Date: 08/12    Lot #: [id number]    Mfr: Nephron    Patient tolerated medication without complications    Given by: Isla Pence (October 06, 2009 9:36 AM)  Orders Added: 1)  Capillary Blood Glucose/CBG [82948] 2)  PFT Basline & Lung Volume [PFT Baseline-Lung  V] 3)  Atrovent 1mg  (Neb) [id number] 4)  Albuterol Sulfate Sol 1mg  unit dose [J7613] 5)  Depo- Medrol 80mg  [J1040] 6)  Admin of Therapeutic Inj  intramuscular or subcutaneous [96372] 7)  Admin of Therapeutic Inj  intramuscular or subcutaneous [96372] 8)  Est. Patient Level III [id number]

## 2010-11-04 NOTE — Assessment & Plan Note (Signed)
Summary: COPD Flare   Vital Signs:  Patient profile:   60 year old female Menstrual status:  hysterectomy Weight:      245.31 pounds O2 Sat:      97 % on Room air Temp:     97.9 degrees F Pulse rate:   77 / minute Pulse rhythm:   regular Resp:     26 per minute BP sitting:   118 / 78  (right arm) Cuff size:   large  Vitals Entered By: Oswaldo Conroy, SMA  O2 Flow:  Room air CC: Pt. is here b/c she has a chest cold. This began last week and is associated with coughing and fleghm. Also, she has a rash behind her head. , Cough Is Patient Diabetic? Yes Did you bring your meter with you today? No Pain Assessment Patient in pain? no      CBG Result 205  Does patient need assistance? Functional Status Self care Ambulation Normal Comments PF 1.     180      2.    160      3.   150   Primary Care Provider:  Aurora Mask FNP  CC:  Pt. is here b/c she has a chest cold. This began last week and is associated with coughing and fleghm. Also, she has a rash behind her head. , and Cough.  History of Present Illness: Pt has ventolin inhaler and she is using twice per day since the last week  Cough      This is a 60 year old woman who presents with Cough.  The symptoms began 1 week ago.  The intensity is described as severe.  The patient reports productive cough, shortness of breath, and wheezing, but denies fever.  Associated symtpoms include cold/URI symptoms.  The patient denies the following symptoms: sore throat, nasal congestion, and peripheral edema.  Risk factors include history of  asthma  Scalp - hair shedding and scabs in head. Pt is using Motions shampoo and Sulfa 8 hair moisturizer Denies any other family members with hair loss as extreme as hers Wears wigs primarily hair is too thin and short that she is not able to style  Habits & Providers  Alcohol-Tobacco-Diet     Alcohol drinks/day: 0     Tobacco Status: current     Tobacco Counseling: to quit use of tobacco  products     Cigarette Packs/Day: 2-3cig     Year Quit: 10/2007  Exercise-Depression-Behavior     Does Patient Exercise: no     Exercise Counseling: to improve exercise regimen     Type of exercise: walking     Exercise (avg: min/session): <30     Times/week: <3     Have you felt down or hopeless? no     Have you felt little pleasure in things? no     Depression Counseling: not indicated; screening negative for depression     Drug Use: no     Seat Belt Use: 100     Sun Exposure: occasionally  Current Medications (verified): 1)  Lantus 100 Unit/ml  Soln (Insulin Glargine) .... 70 Units At Night For Diabetes 2)  Lisinopril 40 Mg  Tabs (Lisinopril) .[name].. 1 Tablet By Mouth Daily For Blood Pressure 3)  Spironolactone 25 Mg  Tabs (Spironolactone) .[name].. 1 Tablet By Mouth Daily For Fluid 4)  Furosemide 40 Mg  Tabs (Furosemide) .[name].. 1 Tablet By Mouth Daily For Fluid 5)  Bayer Low Strength 81 Mg  Tbec (Aspirin) .[name].. 1 Tablet By Mouth Daily For Circulation 6)  Toprol Xl 200 Mg Xr24h-Tab (Metoprolol Succinate) .... One Tablet By Mouth Daily For Blood Pressure 7)  Celexa 20 Mg  Tabs (Citalopram Hydrobromide) .[name].. 1 Tablet By Mouth Daily For Mood 8)  Triamcinolone Acetonide 0.1 %  Oint (Triamcinolone Acetonide) .[name].. 1 Application Topically Two Times A Day To Affected Area 9)  Lipitor 20 Mg Tabs (Atorvastatin Calcium) .... One Tablet By Mouth Nightly For Cholesterol 10)  Neurontin 300 Mg  Caps (Gabapentin) .[name].. 1 Tablet By Mouth At Night 11)  Novolog Mix 70/30 Flexpen 70-30 % Susp (Insulin Aspart Prot & Aspart) .[name].. 15 Units Before Breakfast and 15 Units Before Dinner 12)  Pen Needles 31g X 6 Mm Misc (Insulin Pen Needle) .... To Use With Novolog Insulin Two Times A Day 13)  Ultram 50 Mg Tabs (Tramadol Hcl) .[name].. 1-2 Tablet By Mouth Two Times A Day As Needed For Pain 14)  Ventolin Hfa 108 (90 Base) Mcg/act Aers (Albuterol Sulfate) .... 2 Puffs Every 6 Hours As Needed For Shortness of Breath  Allergies  (verified): 1)  ! * Actos 2)  ! Hydrocortisone (Hydrocortisone)  Review of Systems CV:  Denies chest pain or discomfort and swelling of feet. Resp:  Complains of cough, shortness of breath, and wheezing. GI:  Denies abdominal pain. MS:  Complains of joint pain; right leg pain.  Physical Exam  General:  alert.  obese Head:  normocephalic.  women alopecia - bald in the center -loss of hair follicles (wears wigs)  Eyes:  glasses Lungs:  diffuse wheezing throughout decrease air movement Heart:  normal rate and regular rhythm.   Msk:  up to the exam table Extremities:  no edema Neurologic:  alert & oriented X3.     Impression & Recommendations:  Problem # 1:  CHRONIC OBSTRUCTIVE PULMONARY DISEASE, MODERATE (ICD-496) neb given in office peak flows down depomedrol 80mg  IM given advair sample given Her updated medication list for this problem includes:    Ventolin Hfa 108 (90 Base) Mcg/act Aers (Albuterol sulfate) .[name]... 2 puffs every 6 hours as needed for shortness of breath    Advair Diskus 250-50 Mcg/dose Aepb (Fluticasone-salmeterol) ..... One inhalation two times a day (rinse mouth after use)  Orders: Peak Flow Rate (94150) Pulse Oximetry (single measurment) (94760) Admin of Therapeutic Inj  intramuscular or subcutaneous [id number]) Depo- Medrol 80mg  (J1040)  Problem # 2:  TOBACCO ABUSE (ICD-305.1) advised pt that she needs to stay off cigarettes - admits that she has quit smoking even the 2-3 she was smoking in the past week since breathing problems restarted  Problem # 3:  HYPERTENSION, BENIGN ESSENTIAL (ICD-401.1) DASH diet stable Her updated medication list for this problem includes:    Lisinopril 40 Mg Tabs (Lisinopril) .[name]... 1 tablet by mouth daily for blood pressure    Spironolactone 25 Mg Tabs (Spironolactone) .[name]... 1 tablet by mouth daily for fluid    Furosemide 40 Mg Tabs (Furosemide) .[name]... 1 tablet by mouth daily for fluid    Toprol Xl 200 Mg Xr24h-tab  (Metoprolol succinate) ..... One tablet by mouth daily for blood pressure  Problem # 4:  ALOPECIA (ICD-704.00) spoke with pt about dx unlikely that hair will ever re-grow in area of baldness  Complete Medication List: 1)  Lantus 100 Unit/ml Soln (Insulin glargine) .... 70 units at night for diabetes 2)  Lisinopril 40 Mg Tabs (Lisinopril) .[name].. 1 tablet by mouth daily for blood pressure 3)  Spironolactone 25  Mg Tabs (Spironolactone) .[name].. 1 tablet by mouth daily for fluid 4)  Furosemide 40 Mg Tabs (Furosemide) .[name].. 1 tablet by mouth daily for fluid 5)  Bayer Low Strength 81 Mg Tbec (Aspirin) .[name].. 1 tablet by mouth daily for circulation 6)  Toprol Xl 200 Mg Xr24h-tab (Metoprolol succinate) .... One tablet by mouth daily for blood pressure 7)  Celexa 20 Mg Tabs (Citalopram hydrobromide) .[name].. 1 tablet by mouth daily for mood 8)  Triamcinolone Acetonide 0.1 % Oint (Triamcinolone acetonide) .[name].. 1 application topically two times a day to affected area 9)  Lipitor 20 Mg Tabs (Atorvastatin calcium) .... One tablet by mouth nightly for cholesterol 10)  Neurontin 300 Mg Caps (Gabapentin) .[name].. 1 tablet by mouth at night 11)  Novolog Mix 70/30 Flexpen 70-30 % Susp (Insulin aspart prot & aspart) .[name].. 15 units before breakfast and 15 units before dinner 12)  Pen Needles 31g X 6 Mm Misc (Insulin pen needle) .... To use with novolog insulin two times a day 13)  Ultram 50 Mg Tabs (Tramadol hcl) .[name].. 1-2 tablet by mouth two times a day as needed for pain 14)  Ventolin Hfa 108 (90 Base) Mcg/act Aers (Albuterol sulfate) .... 2 puffs every 6 hours as needed for shortness of breath 15)  Advair Diskus 250-50 Mcg/dose Aepb (Fluticasone-salmeterol) .... One inhalation two times a day (rinse mouth after use) 16)  Ketoconazole 2 % Sham (Ketoconazole) .... Apply to scalp three times per week - let later and sit for 5 minutes then rinse  Other Orders: Capillary Blood Glucose/CBG [id number])  Patient Instructions: 1)  Start  advair 250/500 inhalation two times a day (rinse mouth after use) 2)  Use your ventolin inhaler as needed  3)  Follow up in 2 days with n.martin,fnp for breathiing 4)  Call on tomorrow if worse not better than today  Prescriptions: KETOCONAZOLE 2 % SHAM (KETOCONAZOLE) Apply to scalp three times per week - let later and sit for 5 minutes then rinse  #291ml x 0   Entered and Authorized by:   Aurora Mask FNP   Signed by:   Aurora Mask FNP on 01/13/2010   Method used:   Print then Give to Patient   RxID:   [id number] ADVAIR DISKUS 250-50 MCG/DOSE AEPB (FLUTICASONE-SALMETEROL) One inhalation two times a day (rinse mouth after use)  #1 x 0   Entered and Authorized by:   Aurora Mask FNP   Signed by:   Aurora Mask FNP on 01/13/2010   Method used:   Samples Given   RxID:   [id number]    Medication Administration  Injection # 1:    Medication: Depo- Medrol 80mg     Diagnosis: CHRONIC OBSTRUCTIVE PULMONARY DISEASE, MODERATE (ICD-496)    Route: IM    Site: LUOQ gluteus    Exp Date: 07/2010    Lot #: [id number]    Mfr: Pharmacia    Patient tolerated injection without complications    Given by: [place] Shannon (January 13, 2010 4:01 PM)  Orders Added: 1)  Capillary Blood Glucose/CBG [82948] 2)  Est. Patient Level III [id number] 3)  Peak Flow Rate [94150] 4)  Pulse Oximetry (single measurment) [94760] 5)  Admin of Therapeutic Inj  intramuscular or subcutaneous [96372] 6)  Depo- Medrol 80mg  [J1040]   Appended Document: COPD Flare   Medication Administration  Medication # 1:    Medication: Albuterol Sulfate Sol 1mg  unit dose    Diagnosis: CHRONIC OBSTRUCTIVE PULMONARY DISEASE, MODERATE (ICD-496)    Dose: 2.5mg   Route: inhaled    Exp Date: 06/2011    Lot #: [id number]    Mfr: nephron    Patient tolerated medication without complications    Given by: [place] Shannon (January 13, 2010 4:53 PM)  Medication # 2:    Medication: Atrovent 1mg  (Neb)    Diagnosis: CHRONIC  OBSTRUCTIVE PULMONARY DISEASE, MODERATE (ICD-496)    Dose: .5mg     Route: inhaled    Exp Date: 05/2011    Lot #: [id number]    Mfr: nephron    Patient tolerated medication without complications    Given by: [place] Shannon (January 13, 2010 4:55 PM)  Orders Added: 1)  Albuterol Sulfate Sol 1mg  unit dose [J7613] 2)  Atrovent 1mg  (Neb) [id number] 3)  Nebulizer Tx [id number]

## 2010-11-04 NOTE — Procedures (Signed)
Summary: 3 mo f/u   Current Medications (verified): 1)  Lantus 100 Unit/ml  Soln (Insulin Glargine) .... 70 Units At Night For Diabetes 2)  Lisinopril 40 Mg  Tabs (Lisinopril) .[name].. 1 Tablet By Mouth Daily For Blood Pressure 3)  Spironolactone 25 Mg  Tabs (Spironolactone) .[name].. 1 Tablet By Mouth Daily For Fluid 4)  Furosemide 40 Mg  Tabs (Furosemide) .[name].. 1 Tablet By Mouth Daily For Fluid 5)  Bayer Low Strength 81 Mg  Tbec (Aspirin) .[name].. 1 Tablet By Mouth Daily For Circulation 6)  Toprol Xl 200 Mg Xr24h-Tab (Metoprolol Succinate) .... One Tablet By Mouth Daily For Blood Pressure 7)  Celexa 20 Mg  Tabs (Citalopram Hydrobromide) .[name].. 1 Tablet By Mouth Daily For Mood 8)  Triamcinolone Acetonide 0.1 %  Oint (Triamcinolone Acetonide) .[name].. 1 Application Topically Two Times A Day To Affected Area 9)  Lipitor 20 Mg Tabs (Atorvastatin Calcium) .... One Tablet By Mouth Nightly For Cholesterol 10)  Neurontin 300 Mg  Caps (Gabapentin) .[name].. 1 Tablet By Mouth At Night 11)  Novolog Mix 70/30 Flexpen 70-30 % Susp (Insulin Aspart Prot & Aspart) .[name].. 15 Units Before Breakfast and 15 Units Before Dinner 12)  Pen Needles 31g X 6 Mm Misc (Insulin Pen Needle) .... To Use With Novolog Insulin Two Times A Day 13)  Ultram 50 Mg Tabs (Tramadol Hcl) .[name].. 1-2 Tablet By Mouth Two Times A Day As Needed For Pain 14)  Ventolin Hfa 108 (90 Base) Mcg/act Aers (Albuterol Sulfate) .... 2 Puffs Every 6 Hours As Needed For Shortness of Breath 15)  Ketoconazole 2 % Sham (Ketoconazole) .... Apply To Scalp Three Times Per Week - Let Later and Sit For 5 Minutes Then Rinse  Allergies (verified): 1)  ! * Actos 2)  ! Hydrocortisone (Hydrocortisone)    ICD Specifications Following MD:  Cristopher Peru, MD     ICD Vendor:  [employer]     ICD Model Number:  [phone]     ICD Serial Number:  [id number] ICD DOI:  05/04/2007     ICD Implanting MD:  Cristopher Peru, MD  Lead 1:    Location: RV     DOI: 05/04/2007     Model #: [id number]     Serial #: [id number]      Status: active  Indications::  NICM   ICD Follow Up Remote Check?  No Battery Voltage:  3.06 V     Battery Est. Longevity:  10.6 Underlying rhythm:  ST@104  ICD Dependent:  No       ICD Device Measurements Right Ventricle:  Amplitude: 12 mV, Impedance: 510 ohms, Threshold: 0.5 V at 0.5 msec  Episodes Coumadin:  No Shock:  0     ATP:  0     Nonsustained:  0     Ventricular Pacing:  0%  Brady Parameters Mode VVI     Lower Rate Limit:  40      Tachy Zones VF:  240     VT:  200     Tech Comments:  RV output 2.5@0 .5.  Device function normal.  No Merlin @ this tiime. ROV 3 months clinic. Alma Friendly, LPN  May  4, 624THL [id number] PM  MD Comments:  Agree with above.  Patient Instructions: 1)  Return office visit in 3 months with the device clinic. 2)  Alma Friendly, LPN  May  4, 624THL [id number] PM

## 2010-11-04 NOTE — Progress Notes (Signed)
Summary: REQUESTING REFILLS  Phone Note Call from Patient Call back at Home Phone [phone]   Reason for Call: Refill Medication Summary of Call: [place] DR LAST FRIDAY, AND HE CHANGED TWO OF HER MEDICATIONS AND SHE ALSO SAYS THAT SHE NEEDS REFILLS ON ALL HER OTHER MEDICINES. THEY CAN BE CALLED INTO [facility]. Initial call taken by: Roberto Scales,  Feb 27, 2008 11:11 AM  Follow-up for Phone Call        meds printed and placed onchart to be faxed back. Follow-up by: Aurora Mask FNP,  Feb 27, 2008 2:13 PM    New/Updated Medications: LISINOPRIL 40 MG  TABS (LISINOPRIL) 1 tablet by mouth daily for blood pressure TOPROL XL 100 MG  TB24 (METOPROLOL SUCCINATE) 1 and 1/2 tablet by mouth daily   Prescriptions: TOPROL XL 100 MG  TB24 (METOPROLOL SUCCINATE) 1 and 1/2 tablet by mouth daily  #45 x 5   Entered and Authorized by:   Aurora Mask FNP   Signed by:   Aurora Mask FNP on 02/27/2008   Method used:   Printed then faxed to ...         [id number] LISINOPRIL 40 MG  TABS (LISINOPRIL) 1 tablet by mouth daily for blood pressure  #30 x 5   Entered and Authorized by:   Aurora Mask FNP   Signed by:   Aurora Mask FNP on 02/27/2008   Method used:   Printed then faxed to ...         [id number] SPIRONOLACTONE 25 MG  TABS (SPIRONOLACTONE) 1 tablet by mouth daily for fluid  #30 x 5   Entered and Authorized by:   Aurora Mask FNP   Signed by:   Aurora Mask FNP on 02/27/2008   Method used:   Printed then faxed to ...         RxIDID:[id number] FUROSEMIDE 40 MG  TABS (FUROSEMIDE) 1 tablet by mouth daily for fluid  #30 x 5   Entered and Authorized by:   Aurora Mask FNP   Signed by:   Aurora Mask FNP on 02/27/2008   Method used:   Printed then faxed to ...         [id number]:[id number] BAYER LOW STRENGTH 81 MG  TBEC (ASPIRIN) 1 tablet by mouth daily for circulation  #30 x 5   Entered and  Authorized by:   Aurora Mask FNP   Signed by:   Aurora Mask FNP on 02/27/2008   Method used:   Printed then faxed to ...         [id number] SIMVASTATIN 40 MG  TABS (SIMVASTATIN) 1 tablet by mouth at night for cholesterol  #30 x 5   Entered and Authorized by:   Aurora Mask FNP   Signed by:   Aurora Mask FNP on 02/27/2008   Method used:   Printed then faxed to ...         [id number]

## 2010-11-04 NOTE — Letter (Signed)
Summary: INFLUENZA VACCINATION  INFLUENZA VACCINATION   Imported By: Roland Earl 09/13/2007 09:53:31  _____________________________________________________________________  External Attachment:    Type:   Image     Comment:   External Document

## 2010-11-04 NOTE — Letter (Signed)
Summary: records requested/dds  records requested/dds   Imported By: Roland Earl 07/10/2007 15:38:17  _____________________________________________________________________  External Attachment:    Type:   Image     Comment:   External Document

## 2010-11-04 NOTE — Progress Notes (Signed)
Summary: office visit  Phone Note Call from Patient Call back at Home Phone [phone]   Caller: Patient Summary of Call: she has a knot in one of her legs that has been bother her per few weeks.  She wants an office visit to come sometime soon. FRP [place] Initial call taken by: Alexis Goodell,  July 23, 2007 9:52 AM  Follow-up for Phone Call        left message on answer machine to return call  Follow-up by: Bridgett Larsson RN,  July 30, 2007 8:55 AM  Additional Follow-up for Phone Call Additional follow up Details #1::        Appt Scheduled Additional Follow-up by: Alexis Goodell,  July 30, 2007 4:44 PM

## 2010-11-04 NOTE — Assessment & Plan Note (Signed)
Summary: pc2   Primary Kara Melching:  Aurora Mask FNP   History of Present Illness: [name] returns today for followup.  She is a very pleasant middle-aged woman with obesity and nonischemic cardiomyopathy and congestive heart failure who is status post ICD insertion just three years ago.  She returns today for followup.  She denies c/p or sob.  No peripheral edema.  No intercurrent ICD shocks.  She has been trying to lose weight and is down 7 lbs.  Her goal is to lose 50 lbs.  No other symptoms.  Current Medications (verified): 1)  Lantus 100 Unit/ml  Soln (Insulin Glargine) .... 70 Units At Night For Diabetes 2)  Lisinopril 40 Mg  Tabs (Lisinopril) .[name].. 1 Tablet By Mouth Daily For Blood Pressure 3)  Spironolactone 25 Mg  Tabs (Spironolactone) .[name].. 1 Tablet By Mouth Daily For Fluid 4)  Furosemide 40 Mg  Tabs (Furosemide) .[name].. 1 Tablet By Mouth Daily For Fluid 5)  Bayer Low Strength 81 Mg  Tbec (Aspirin) .[name].. 1 Tablet By Mouth Daily For Circulation 6)  Toprol Xl 200 Mg Xr24h-Tab (Metoprolol Succinate) .... One Tablet By Mouth Daily For Blood Pressure 7)  Celexa 20 Mg  Tabs (Citalopram Hydrobromide) .[name].. 1 Tablet By Mouth Daily For Mood 8)  Triamcinolone Acetonide 0.1 %  Oint (Triamcinolone Acetonide) .... As Needed 9)  Lipitor 40 Mg Tabs (Atorvastatin Calcium) .... One Tablet By Mouth Nightly For Cholesterol  **pharmacy - Note Change in Dose** 10)  Neurontin 300 Mg  Caps (Gabapentin) .[name].. 1 Tablet By Mouth At Night 11)  Novolog Mix 70/30 Flexpen 70-30 % Susp (Insulin Aspart Prot & Aspart) .[name].. 15 Units Before Breakfast and 15 Units Before Dinner 12)  Pen Needles 31g X 6 Mm Misc (Insulin Pen Needle) .... To Use With Novolog Insulin Two Times A Day 13)  Ultram 50 Mg Tabs (Tramadol Hcl) .[name].. 1-2 Tablet By Mouth Two Times A Day As Needed For Pain 14)  Ventolin Hfa 108 (90 Base) Mcg/act Aers (Albuterol Sulfate) .... 2 Puffs Every 6 Hours As Needed For Shortness of Breath 15)  Ketoconazole 2 %  Sham (Ketoconazole) .... Apply To Scalp Three Times Per Week - Let Later and Sit For 5 Minutes Then Rinse 16)  Advair Diskus 250-50 Mcg/dose Aepb (Fluticasone-Salmeterol) .... Two Times A Day  Allergies: 1)  ! * Actos 2)  ! Hydrocortisone (Hydrocortisone)  Past History:  Past Medical History: Last updated: 05/06/2009 HYPERTHYROIDISM, SUBCLINICAL (ICD-242.90) POSTMENOPAUSAL SYNDROME (ICD-627.9) HYSTEROSCOPY, HX OF (ICD-V45.89) OTHER SCREENING BREAST EXAMINATION (ICD-V76.19) OTITIS EXTERNA, ACUTE, RIGHT (ICD-380.12) SLEEP APNEA (ICD-780.57) FATIGUE (ICD-780.79) OBESITY (ICD-278.00) IMPLANTATION OF DEFIBRILLATOR, HX OF (ICD-V45.02) LEG PAIN (ICD-729.5) ECZEMA (ICD-692.9) DEPRESSION (ICD-311) LIPOMA (ICD-214.9) FAMILY HISTORY SEIZURES (ICD-V17.2) FAMILY HISTORY DIABETES 1ST DEGREE RELATIVE (ICD-V18.0) ULCER, LEG (ICD-707.10) HYPERLIPIDEMIA (ICD-272.4) CONGESTIVE HEART FAILURE, SYSTOLIC DYSFUNCTION (A999333) HYPERTENSION, BENIGN ESSENTIAL (ICD-401.1) CARDIOMYOPATHY (ICD-425.4) DM (ICD-250.00)  Past Surgical History: Last updated: 05/25/2007 Hysterectomy Tubal ligation ICD placement (05-04-07)  Review of Systems  The patient denies chest pain, syncope, dyspnea on exertion, and peripheral edema.    Vital Signs:  Patient profile:   60 year old female Menstrual status:  hysterectomy Height:      60 inches Weight:      247 pounds BMI:     48.41 O2 Sat:      93 % Pulse rate:   97 / minute BP sitting:   138 / 82  (left arm)  Vitals Entered By: Margaretmary Bayley CMA (August 10, 2010 2:32 PM)  Physical Exam  General:  alert.  obese Head:  normocephalic.   Eyes:  glasses Mouth:  Teeth, gums and palate normal. Oral mucosa normal. Neck:  Neck supple, no JVD. No masses, thyromegaly or abnormal cervical nodes. Chest Wall:  Well healed ICD incision. Lungs:  normal breath sounds.   Heart:  normal rate and regular rhythm.   Abdomen:  normal bowel sounds.   Msk:  up to the  exam table Pulses:  pulses normal in all 4 extremities Extremities:  no edema Neurologic:  alert & oriented X3.      ICD Specifications Following MD:  Cristopher Peru, MD     ICD Vendor:  [facility] Series Jude     ICD Model Number:  [id number]     ICD Serial Number:  [id number] ICD DOI:  05/04/2007     ICD Implanting MD:  Cristopher Peru, MD  Lead 1:    Location: RV     DOI: 05/04/2007     Model #: [id number]     Serial #: [id number]     Status: active  Indications::  NICM   ICD Follow Up Remote Check?  No Battery Voltage:  2.92 V     Charge Time:  11.0 seconds     Underlying rhythm:  SR ICD Dependent:  No       ICD Device Measurements Right Ventricle:  Amplitude: 12 mV, Impedance: 470 ohms, Threshold: 1.0 V at 0.5 msec  Episodes Coumadin:  No Shock:  0     ATP:  0     Nonsustained:  0     Ventricular Pacing:  0%  Brady Parameters Mode VVI     Lower Rate Limit:  40      Tachy Zones VF:  240     VT:  200     Next Cardiology Appt Due:  11/03/2010 Tech Comments:  No parameter changes.  Device function normal.  ROV 3 months clinic. Alma Friendly, LPN  November  8, 624THL 2:46 PM  MD Comments:  Agree with above.  Impression & Recommendations:  Problem # 1:  IMPLANTATION OF DEFIBRILLATOR, HX OF (ICD-V45.02) Her device is working normally.  will recheck in several months.  Problem # 2:  CONGESTIVE HEART FAILURE, SYSTOLIC DYSFUNCTION (A999333) Her symptoms are well controlled.  She is instructed to maintain a low sodium diet. Her updated medication list for this problem includes:    Lisinopril 40 Mg Tabs (Lisinopril) .[name]... 1 tablet by mouth daily for blood pressure    Spironolactone 25 Mg Tabs (Spironolactone) .[name]... 1 tablet by mouth daily for fluid    Furosemide 40 Mg Tabs (Furosemide) .[name]... 1 tablet by mouth daily for fluid    Bayer Low Strength 81 Mg Tbec (Aspirin) .[name]... 1 tablet by mouth daily for circulation    Toprol Xl 200 Mg Xr24h-tab (Metoprolol succinate) ..... One tablet by mouth daily for blood  pressure  Problem # 3:  HYPERTENSION, BENIGN ESSENTIAL (ICD-401.1) Her blood pressure is fairly well controlled. She will continue to attempt to lose weight and maintain a low sodium diet. Her updated medication list for this problem includes:    Lisinopril 40 Mg Tabs (Lisinopril) .[name]... 1 tablet by mouth daily for blood pressure    Spironolactone 25 Mg Tabs (Spironolactone) .[name]... 1 tablet by mouth daily for fluid    Furosemide 40 Mg Tabs (Furosemide) .[name]... 1 tablet by mouth daily for fluid    Bayer Low Strength 81 Mg Tbec (Aspirin) .[name]... 1 tablet by mouth daily  for circulation    Toprol Xl 200 Mg Xr24h-tab (Metoprolol succinate) ..... One tablet by mouth daily for blood pressure  Patient Instructions: 1)  Your physician recommends that you schedule a follow-up appointment in: 3 months in the device clinic and 12 months with Dr Lovena Le

## 2010-11-04 NOTE — Letter (Signed)
Summary: Generic Letter  [facility]  [address] [place], [place] [address]   Phone: [phone]  Fax: [phone]    10/25/2007  [name] [address] [place], [place]  [address]  Please send notes from surgical evaluation to above address.      Sincerely,     Aurora Mask FNP [facility]

## 2010-11-04 NOTE — Assessment & Plan Note (Signed)
Summary: Acute - Leg pain   Vital Signs:  Patient Profile:   60 Years Old Female Height:     60 inches Weight:      235 pounds BMI:     46.06 BSA:     2.00 Temp:     97.7 degrees F oral Pulse rate:   72 / minute Pulse rhythm:   regular Resp:     16 per minute BP sitting:   140 / 100  (left arm) Cuff size:   large  Pt. in pain?   yes    Location:   legs    Intensity:   8  Vitals Entered By: Isla Pence (January 23, 2008 11:57 AM)              Is Patient Diabetic? Yes  CBG Result 217 CBG Device ID A  Does patient need assistance? Ambulation Normal Comments pt brought medications today     Visit Type:  Acute  Chief Complaint:  legs painful and burning.  History of Present Illness:  Pt into office for leg pain. bilaterally She reports that pain is in the bil legs.  She reports that she has pain in the calf of both legs.  Some jumping in legs at night.  She presents today with cane.  Pain worse over the past 2 weeks.  she does continue to take her diuretics as ordered.  no potassium supps.  eats 2-3 bananas per wk. she is on 1 potassium sparing diuretic. No tingling in toes.   next cardiology appt later this month.    s/p lipoma removal from right leg.  still some tenderness at the site but area has healed well.  Tobacco use - quit 10/2007 Weight gain since smoking cessation     Updated Prior Medication List: GLUCOPHAGE 1000 MG  TABS (METFORMIN HCL) 1 tablet by mouth two times a day for blood sugar LANTUS 100 UNIT/ML  SOLN (INSULIN GLARGINE) 30 units at night for diabetes LISINOPRIL 40 MG  TABS (LISINOPRIL) 1 tablet by mouth two times a day for blood pressure SIMVASTATIN 40 MG  TABS (SIMVASTATIN) 1 tablet by mouth at night for cholesterol SPIRONOLACTONE 25 MG  TABS (SPIRONOLACTONE) 1 tablet by mouth daily for fluid FUROSEMIDE 40 MG  TABS (FUROSEMIDE) 1 tablet by mouth daily for fluid BAYER LOW STRENGTH 81 MG  TBEC (ASPIRIN) 1 tablet by mouth daily for  circulation NAPROSYN 500 MG  TABS (NAPROXEN) 1 tablet by mouth two times a day as needed for pain TOPROL XL 100 MG  TB24 (METOPROLOL SUCCINATE) 1 once daily CELEXA 20 MG  TABS (CITALOPRAM HYDROBROMIDE) 1 tablet by mouth daily for mood TRIAMCINOLONE ACETONIDE 0.1 %  OINT (TRIAMCINOLONE ACETONIDE) 1 application topically two times a day to affected area LIPITOR 10 MG  TABS (ATORVASTATIN CALCIUM) take 2 tablets at bedtime for cholesterol  Current Allergies: No known allergies     Risk Factors:  Tobacco use:  quit    Year quit:  10/2007 Drug use:  no HIV high-risk behavior:  no Caffeine use:  2 drinks per day Alcohol use:  no Exercise:  no Seatbelt use:  100 % Sun Exposure:  occasionally  Family History Risk Factors:    Family History of MI in females < 41 years old:  yes    Family History of MI in males < 40 years old:  yes  Mammogram History:    Date of Last Mammogram:  11/03/2006   Review of Systems  CV  Denies chest pain or discomfort.  Resp      Denies cough.  MS      leg pain   Physical Exam  General:     alert and overweight-appearing.   Head:     normocephalic.  wig Lungs:     normal breath sounds.   Heart:     soft, S1S2 Abdomen:     soft and non-tender.  obese Msk:     decreased in bil lower ext tenderness with palpation of bil calf - mostly with dorsiflexion Pulses:     DP +1  Extremities:     1+ left pedal edema and 1+ right pedal edema.   Neurologic:     wobbling gait cane use Psych:     Oriented X3.      Impression & Recommendations:  Problem # 1:  LEG PAIN (ICD-729.5) will check potassium also pt has gained wieght since smoking cessation also may be neuropathy from diabetes  Will check labs will give neurontin rx and instruct pt to start if only if she is notified that potassium is ok she also needs to decrease portions in attempt to lose weight.  Complete Medication List: 1)  Glucophage 1000 Mg Tabs (Metformin hcl)  .[name].. 1 tablet by mouth two times a day for blood sugar 2)  Lantus 100 Unit/ml Soln (Insulin glargine) .... 30 units at night for diabetes 3)  Lisinopril 40 Mg Tabs (Lisinopril) .[name].. 1 tablet by mouth two times a day for blood pressure 4)  Simvastatin 40 Mg Tabs (Simvastatin) .[name].. 1 tablet by mouth at night for cholesterol 5)  Spironolactone 25 Mg Tabs (Spironolactone) .[name].. 1 tablet by mouth daily for fluid 6)  Furosemide 40 Mg Tabs (Furosemide) .[name].. 1 tablet by mouth daily for fluid 7)  Bayer Low Strength 81 Mg Tbec (Aspirin) .[name].. 1 tablet by mouth daily for circulation 8)  Naprosyn 500 Mg Tabs (Naproxen) .[name].. 1 tablet by mouth two times a day as needed for pain 9)  Toprol Xl 100 Mg Tb24 (Metoprolol succinate) .[name].. 1 once daily 10)  Celexa 20 Mg Tabs (Citalopram hydrobromide) .[name].. 1 tablet by mouth daily for mood 11)  Triamcinolone Acetonide 0.1 % Oint (Triamcinolone acetonide) .[name].. 1 application topically two times a day to affected area 12)  Lipitor 10 Mg Tabs (Atorvastatin calcium) .... Take 2 tablets at bedtime for cholesterol 13)  Darvocet-n 100 100-650 Mg Tabs (Propoxyphene n-apap) 14)  Neurontin 300 Mg Caps (Gabapentin) .[name].. 1 tablet by mouth at night  Other Orders: Capillary Blood Glucose GU:[id number]) Fingerstick (A999333) T-Basic Metabolic Panel (99991111)   Patient Instructions: 1)  Follow up in 1 month for diabetes.  Come fasting for labs.    Prescriptions: NEURONTIN 300 MG  CAPS (GABAPENTIN) 1 tablet by mouth at night  #30 x 1   Entered and Authorized by:   Aurora Mask FNP   Signed by:   Aurora Mask FNP on 01/23/2008   Method used:   Print then Give to Patient   RxID:   [id number]  ] Last LDL:                                                 95 (08/09/2007 11:41:00 AM)

## 2010-11-04 NOTE — Miscellaneous (Signed)
Summary: Reative Airway Disease  No visit scheduled listened to pt while she was in the office She will need to come on Monday for reasses  Patient Instructions: 1)  Use the inhaler: 2 puffs two times a day  2)  Mucinex (plain) take two times a day or get Tussin (for diabetics) 3)  Schedule a lab visit on monday to recheck.  Need to listen to lungs Clinical Lists Changes  Medications: Added new medication of FLOVENT HFA 44 MCG/ACT AERO (FLUTICASONE PROPIONATE  HFA) Two puffs two times a day - Signed Rx of FLOVENT HFA 44 MCG/ACT AERO (FLUTICASONE PROPIONATE  HFA) Two puffs two times a day;  #1 x 0;  Signed;  Entered by: Aurora Mask FNP;  Authorized by: Aurora Mask FNP;  Method used: Samples Given Observations: Added new observation of GEN APPEAR: obese (10/01/2009 10:54) Added new observation of HEART EXAM: normal rate and regular rhythm.   (10/01/2009 10:54) Added new observation of PEADULT: Aurora Mask FNP ~Lungs`lung exam ~Heart`Heart exam ~General`Gen appear (10/01/2009 10:54) Added new observation of LUNG EXAM: expiratory wheezes (10/01/2009 10:54) Added new observation of INSTRUCTIONS:  Use the inhaler: 2 puffs two times a day   Mucinex (plain) take two times a day or get Tussin (for diabetics)  Schedule a lab visit on monday to recheck.  Need to listen to lungs (10/01/2009 10:54)     Physical Exam  General:  obese Lungs:  expiratory wheezes Heart:  normal rate and regular rhythm.    Prescriptions: FLOVENT HFA 44 MCG/ACT AERO (FLUTICASONE PROPIONATE  HFA) Two puffs two times a day  #1 x 0   Entered and Authorized by:   Aurora Mask FNP   Signed by:   Aurora Mask FNP on 10/01/2009   Method used:   Samples Given   RxID:   [id number]

## 2010-11-04 NOTE — Assessment & Plan Note (Signed)
Summary: [facility]   Visit Type:  [facility] clinic Primary Provider:  Aurora Mask FNP  CC:  no complaints.  History of Present Illness: Ms. Gruetzmacher returns today for followup.  She is a very pleasant middle-aged woman with obesity and nonischemic cardiomyopathy and congestive heart failure who is status post ICD insertion just two years ago.  She returns today for followup.  She denies c/p or sob.  No peripheral edema.  She does note that she was hospitalized about a month ago for some dehydration after expriencing some vomiting.  No intercurrent ICD shocks.  Current Medications (verified): 1)  Lantus 100 Unit/ml  Soln (Insulin Glargine) .... 70 Units At Night For Diabetes 2)  Lisinopril 40 Mg  Tabs (Lisinopril) .[name].. 1 Tablet By Mouth Daily For Blood Pressure 3)  Spironolactone 25 Mg  Tabs (Spironolactone) .[name].. 1 Tablet By Mouth Daily For Fluid 4)  Furosemide 40 Mg  Tabs (Furosemide) .[name].. 1 Tablet By Mouth Daily For Fluid 5)  Bayer Low Strength 81 Mg  Tbec (Aspirin) .[name].. 1 Tablet By Mouth Daily For Circulation 6)  Naprosyn 500 Mg  Tabs (Naproxen) .[name].. 1 Tablet By Mouth Two Times A Day As Needed For Pain 7)  Toprol Xl 200 Mg Xr24h-Tab (Metoprolol Succinate) .... One Tablet By Mouth Daily For Blood Pressure 8)  Celexa 20 Mg  Tabs (Citalopram Hydrobromide) .[name].. 1 Tablet By Mouth Daily For Mood 9)  Triamcinolone Acetonide 0.1 %  Oint (Triamcinolone Acetonide) .[name].. 1 Application Topically Two Times A Day To Affected Area 10)  Lipitor 20 Mg Tabs (Atorvastatin Calcium) .... One Tablet By Mouth Nightly For Cholesterol 11)  Neurontin 300 Mg  Caps (Gabapentin) .[name].. 1 Tablet By Mouth At Night 12)  Novolog Mix 70/30 Flexpen 70-30 % Susp (Insulin Aspart Prot & Aspart) .[name].. 15 Units Before Breakfast and 15 Units Before Dinner 13)  Pen Needles 31g X 6 Mm Misc (Insulin Pen Needle) .... To Use With Novolog Insulin Two Times A Day  Allergies (verified): 1)  ! * Actos  Past History:  Past Medical  History: Last updated: 05/06/2009 HYPERTHYROIDISM, SUBCLINICAL (ICD-242.90) POSTMENOPAUSAL SYNDROME (ICD-627.9) HYSTEROSCOPY, HX OF (ICD-V45.89) OTHER SCREENING BREAST EXAMINATION (ICD-V76.19) OTITIS EXTERNA, ACUTE, RIGHT (ICD-380.12) SLEEP APNEA (ICD-780.57) FATIGUE (ICD-780.79) OBESITY (ICD-278.00) IMPLANTATION OF DEFIBRILLATOR, HX OF (ICD-V45.02) LEG PAIN (ICD-729.5) ECZEMA (ICD-692.9) DEPRESSION (ICD-311) LIPOMA (ICD-214.9) FAMILY HISTORY SEIZURES (ICD-V17.2) FAMILY HISTORY DIABETES 1ST DEGREE RELATIVE (ICD-V18.0) ULCER, LEG (ICD-707.10) HYPERLIPIDEMIA (ICD-272.4) CONGESTIVE HEART FAILURE, SYSTOLIC DYSFUNCTION (A999333) HYPERTENSION, BENIGN ESSENTIAL (ICD-401.1) CARDIOMYOPATHY (ICD-425.4) DM (ICD-250.00)  Past Surgical History: Last updated: 05/25/2007 Hysterectomy Tubal ligation ICD placement (05-04-07)  Review of Systems  The patient denies chest pain, syncope, dyspnea on exertion, peripheral edema, and prolonged cough.    Vital Signs:  Patient profile:   59 year old female Menstrual status:  hysterectomy Height:      60 inches Weight:      241 pounds Pulse rate:   75 / minute BP sitting:   158 / 93  (left arm) Cuff size:   regular  Vitals Entered By: Lubertha Basque, CNA (May 13, 2009 9:31 AM)  Physical Exam  General:  Well developed, well nourished, in no acute distress. Head:  normocephalic and atraumatic Eyes:  PERRLA/EOM intact; conjunctiva and lids normal. Mouth:  Teeth, gums and palate normal. Oral mucosa normal. Neck:  Neck supple, no JVD. No masses, thyromegaly or abnormal cervical nodes. Chest Wall:  Well healed ICD incision. Lungs:  clear bilaterally with no wheezes, rales, or rhochi. Heart:  RRR with normal S1 and S2.  PMI is enlarged or laterally displaced. Abdomen:  Bowel sounds positive; abdomen soft and non-tender without masses, organomegaly, or hernias noted. No hepatosplenomegaly. Msk:  Back normal, normal gait. Muscle strength and  tone normal. Pulses:  pulses normal in all 4 extremities Extremities:  No clubbing or cyanosis. Neurologic:  Alert and oriented x 3.   ICD Specifications Following MD:  Cristopher Peru, MD     ICD Vendor:  [facility]     ICD Model Number:  [id number]     ICD Serial Number:  [id number] ICD DOI:  05/04/2007     ICD Implanting MD:  Cristopher Peru, MD  Lead 1:    Location: RV     DOI: 05/04/2007     Model #: [id number]     Serial #: [id number]     Status: active  Indications::  NICM   ICD Follow Up Remote Check?  No Battery Voltage:  3.13 V     Charge Time:  10.5 seconds     Underlying rhythm:  SR@79  ICD Dependent:  No       ICD Device Measurements Right Ventricle:  Amplitude: >12 mV, Impedance: 530 ohms, Threshold: 0.5 V at 0.5 msec  Episodes Coumadin:  No Shock:  0     ATP:  1     Nonsustained:  0     ICD Appropriate Therapy?  No Ventricular Pacing:  <1%  Brady Parameters Mode VVI     Lower Rate Limit:  40      Tachy Zones VF:  240     VT:  200     Next Cardiology Appt Due:  08/03/2009 Tech Comments:  VT episode sinus No changes Alma Friendly, LPN  August 11, 624THL 9:45 AM  MD Comments:  Agree with above.  Impression & Recommendations:  Problem # 1:  IMPLANTATION OF DEFIBRILLATOR, HX OF (ICD-V45.02) Her device is working normally.  Will recheck in 3 months.  Problem # 2:  CONGESTIVE HEART FAILURE, SYSTOLIC DYSFUNCTION (A999333) Her CHF remains class 2.  Continue current meds. Her updated medication list for this problem includes:    Lisinopril 40 Mg Tabs (Lisinopril) .[name]... 1 tablet by mouth daily for blood pressure    Spironolactone 25 Mg Tabs (Spironolactone) .[name]... 1 tablet by mouth daily for fluid    Furosemide 40 Mg Tabs (Furosemide) .[name]... 1 tablet by mouth daily for fluid    Bayer Low Strength 81 Mg Tbec (Aspirin) .[name]... 1 tablet by mouth daily for circulation    Toprol Xl 200 Mg Xr24h-tab (Metoprolol succinate) ..... One tablet by mouth daily for blood pressure  Patient Instructions: 1)   Your physician recommends that you schedule a follow-up appointment in: 13months with device clinic  2)  12 months with Dr. Lovena Le

## 2010-11-04 NOTE — Assessment & Plan Note (Signed)
Summary: Diabetes/HTN   Vital Signs:  Patient profile:   60 year old female Menstrual status:  hysterectomy Weight:      243.5 pounds BMI:     47.73 Temp:     97.9 degrees F oral Pulse rate:   78 / minute Pulse rhythm:   regular Resp:     20 per minute BP sitting:   135 / 81  (left arm) Cuff size:   large  Vitals Entered By: Isla Pence (May 07, 2010 9:10 AM)  Nutrition Counseling: Patient's BMI is greater than 25 and therefore counseled on weight management options. CC: follow-up visit DM, Hypertension Management, Lipid Management, CHF Management, Depression Is Patient Diabetic? Yes Pain Assessment Patient in pain? no      CBG Result 209 CBG Device ID B  Does patient need assistance? Functional Status Self care Ambulation Normal   Primary Care Provider:  Aurora Mask FNP  CC:  follow-up visit DM, Hypertension Management, Lipid Management, CHF Management, and Depression.  History of Present Illness:  Pt into the office for follow up on diabetes.  Pt presents today with all her medications. Upon review of meds she has not had any spironolactone.  Pharmacy review indicates that pt completed her last refill in May. This provider will send refills to the pharmacy so she can pick up on next month. Likely reason why BP is slightly elevated today.    CHF History:      She denies headache, chest pain, palpitations, and peripheral edema.  Daily weights are not being checked.  She understands fluid management and sodium restriction but is not following this regimen.  The patient expresses understanding of the treatment plan and her medications.  She admits to being compliant with her medications.  ADL's are being done without symptoms or restrictions.  The patient has not been enrolled in the CHF Eduction Program.  She denies any side effects from her medications.    Depression History:      The patient denies a depressed mood most of the day and a diminished  interest in her usual daily activities.        The patient denies that she feels like life is not worth living, denies that she wishes that she were dead, and denies that she has thought about ending her life.         Depression Treatment History:  Prior Medication Used:   Start Date: Assessment of Effect:   Comments:  Celexa (citalopram)     --     much improvement     --  Diabetes Management History:      The patient is a 60 years old female who comes in for evaluation of Type 2 Diabetes Mellitus.  She has not been enrolled in the "Diabetic Education Program".  She states lack of understanding of dietary principles and is not following her diet appropriately.  No sensory loss is reported.  Self foot exams are not being performed.  She is checking home blood sugars.  She says that she is not exercising regularly.        Hypoglycemic symptoms are not occurring.  No hyperglycemic symptoms are reported.        No changes have been made to her treatment plan since last visit.        Her home blood sugars include fasting blood sugars: highest: 205, lowest: 79.    Hypertension History:      She denies headache, chest pain, and palpitations.  She notes no problems with any antihypertensive medication side effects.        Positive major cardiovascular risk factors include female age 68 years old or older, diabetes, hyperlipidemia, hypertension, family history for ischemic heart disease (females less than 18 years old & males less than 54 years old), and current tobacco user.        Positive history for target organ damage include cardiac end organ damage (either CHF or LVH).  Further assessment for target organ damage reveals no history of ASHD, stroke/TIA, peripheral vascular disease, renal insufficiency, or hypertensive retinopathy.    Lipid Management History:      Positive NCEP/ATP III risk factors include female age 58 years old or older, diabetes, family history for ischemic heart disease  (females less than 92 years old & males less than 33 years old), current tobacco user, and hypertension.  Negative NCEP/ATP III risk factors include no history of early menopause without estrogen hormone replacement, HDL cholesterol greater than 60, no ASHD (atherosclerotic heart disease), no prior stroke/TIA, no peripheral vascular disease, and no history of aortic aneurysm.        The patient states that she knows about the "Therapeutic Lifestyle Change" diet.  Her compliance with the TLC diet is poor.  The patient expresses understanding of adjunctive measures for cholesterol lowering.  She expresses no side effects from her lipid-lowering medication.  The patient denies any symptoms to suggest myopathy or liver disease.        Habits & Providers  Alcohol-Tobacco-Diet     Alcohol drinks/day: 0     Tobacco Status: current     Tobacco Counseling: to quit use of tobacco products     Cigarette Packs/Day: 2-3cig     Year Quit: 10/2007  Exercise-Depression-Behavior     Does Patient Exercise: no     Exercise Counseling: to improve exercise regimen     Type of exercise: walking     Exercise (avg: min/session): <30     Times/week: <3     Have you felt down or hopeless? no     Have you felt little pleasure in things? no     Depression Counseling: not indicated; screening negative for depression     Drug Use: no     Seat Belt Use: 100     Sun Exposure: occasionally  Medications Prior to Update: 1)  Lantus 100 Unit/ml  Soln (Insulin Glargine) .... 70 Units At Night For Diabetes 2)  Lisinopril 40 Mg  Tabs (Lisinopril) .[name].. 1 Tablet By Mouth Daily For Blood Pressure 3)  Spironolactone 25 Mg  Tabs (Spironolactone) .[name].. 1 Tablet By Mouth Daily For Fluid 4)  Furosemide 40 Mg  Tabs (Furosemide) .[name].. 1 Tablet By Mouth Daily For Fluid 5)  Bayer Low Strength 81 Mg  Tbec (Aspirin) .[name].. 1 Tablet By Mouth Daily For Circulation 6)  Toprol Xl 200 Mg Xr24h-Tab (Metoprolol Succinate) .... One Tablet By  Mouth Daily For Blood Pressure 7)  Celexa 20 Mg  Tabs (Citalopram Hydrobromide) .[name].. 1 Tablet By Mouth Daily For Mood 8)  Triamcinolone Acetonide 0.1 %  Oint (Triamcinolone Acetonide) .[name].. 1 Application Topically Two Times A Day To Affected Area 9)  Lipitor 20 Mg Tabs (Atorvastatin Calcium) .... One Tablet By Mouth Nightly For Cholesterol 10)  Neurontin 300 Mg  Caps (Gabapentin) .[name].. 1 Tablet By Mouth At Night 11)  Novolog Mix 70/30 Flexpen 70-30 % Susp (Insulin Aspart Prot & Aspart) .[name].. 15 Units Before Breakfast and 15 Units Before Dinner  12)  Pen Needles 31g X 6 Mm Misc (Insulin Pen Needle) .... To Use With Novolog Insulin Two Times A Day 13)  Ultram 50 Mg Tabs (Tramadol Hcl) .[name].. 1-2 Tablet By Mouth Two Times A Day As Needed For Pain 14)  Ventolin Hfa 108 (90 Base) Mcg/act Aers (Albuterol Sulfate) .... 2 Puffs Every 6 Hours As Needed For Shortness of Breath 15)  Ketoconazole 2 % Sham (Ketoconazole) .... Apply To Scalp Three Times Per Week - Let Later and Sit For 5 Minutes Then Rinse  Allergies (verified): 1)  ! * Actos 2)  ! Hydrocortisone (Hydrocortisone)  Review of Systems General:  Denies fever. CV:  Denies chest pain or discomfort. Resp:  Denies cough. GI:  Denies abdominal pain, nausea, and vomiting.  Physical Exam  General:  alert.  obese Head:  normocephalic.   Lungs:  normal breath sounds.   Heart:  normal rate and regular rhythm.   Abdomen:  obese Msk:  up to the exam table Neurologic:  alert & oriented X3.   Psych:  Oriented X3.    Diabetes Management Exam:    Foot Exam (with socks and/or shoes not present):       Sensory-Monofilament:          Left foot: normal          Right foot: normal       Nails:          Left foot: thickened          Right foot: thickened   Impression & Recommendations:  Problem # 1:  DM (ICD-250.00) Hbga1c = 10.1 which is improved for pt will continue to encourage goal Her updated medication list for this problem includes:     Lantus 100 Unit/ml Soln (Insulin glargine) .Marland KitchenMarland KitchenMarland KitchenMarland Kitchen 70 units at night for diabetes    Lisinopril 40 Mg Tabs (Lisinopril) .[name]... 1 tablet by mouth daily for blood pressure    Bayer Low Strength 81 Mg Tbec (Aspirin) .[name]... 1 tablet by mouth daily for circulation    Novolog Mix 70/30 Flexpen 70-30 % Susp (Insulin aspart prot & aspart) .Marland KitchenMarland KitchenMarland KitchenMarland Kitchen 15 units before breakfast and 15 units before dinner  Orders: Hemoglobin A1C [id number]) T-Comprehensive Metabolic Panel (A999333) T-CBC w/Diff [id number]) T-TSH [id number])  Problem # 2:  HYPERTENSION, BENIGN ESSENTIAL (ICD-401.1) BP slightly elevated today but med review indicates that she has been out of spironolactone since May 2011 new rx sent to the pharmacy Her updated medication list for this problem includes:    Lisinopril 40 Mg Tabs (Lisinopril) .[name]... 1 tablet by mouth daily for blood pressure    Spironolactone 25 Mg Tabs (Spironolactone) .[name]... 1 tablet by mouth daily for fluid    Furosemide 40 Mg Tabs (Furosemide) .[name]... 1 tablet by mouth daily for fluid    Toprol Xl 200 Mg Xr24h-tab (Metoprolol succinate) ..... One tablet by mouth daily for blood pressure  Problem # 3:  HYPERLIPIDEMIA (ICD-272.4) labs checked today will review with pt when available Her updated medication list for this problem includes:    Lipitor 20 Mg Tabs (Atorvastatin calcium) ..... One tablet by mouth nightly for cholesterol  Problem # 4:  TOBACCO ABUSE (ICD-305.1) advised cessation of 2-3 cigs per day  Problem # 5:  OBESITY (ICD-278.00) down 1 pound since last visit encouraged pt to keep on diet and increase exercise  Problem # 6:  OTHER SCREENING BREAST EXAMINATION (ICD-V76.19) appt given to pt today along with self breast exam  placcard Orders: Mammogram (Screening) (Mammo)  Complete Medication List: 1)  Lantus 100 Unit/ml Soln (Insulin glargine) .... 70 units at night for diabetes 2)  Lisinopril 40 Mg Tabs (Lisinopril) .[name].. 1 tablet by mouth daily for blood  pressure 3)  Spironolactone 25 Mg Tabs (Spironolactone) .[name].. 1 tablet by mouth daily for fluid 4)  Furosemide 40 Mg Tabs (Furosemide) .[name].. 1 tablet by mouth daily for fluid 5)  Bayer Low Strength 81 Mg Tbec (Aspirin) .[name].. 1 tablet by mouth daily for circulation 6)  Toprol Xl 200 Mg Xr24h-tab (Metoprolol succinate) .... One tablet by mouth daily for blood pressure 7)  Celexa 20 Mg Tabs (Citalopram hydrobromide) .[name].. 1 tablet by mouth daily for mood 8)  Triamcinolone Acetonide 0.1 % Oint (Triamcinolone acetonide) .[name].. 1 application topically two times a day to affected area 9)  Lipitor 20 Mg Tabs (Atorvastatin calcium) .... One tablet by mouth nightly for cholesterol 10)  Neurontin 300 Mg Caps (Gabapentin) .[name].. 1 tablet by mouth at night 11)  Novolog Mix 70/30 Flexpen 70-30 % Susp (Insulin aspart prot & aspart) .[name].. 15 units before breakfast and 15 units before dinner 12)  Pen Needles 31g X 6 Mm Misc (Insulin pen needle) .... To use with novolog insulin two times a day 13)  Ultram 50 Mg Tabs (Tramadol hcl) .[name].. 1-2 tablet by mouth two times a day as needed for pain 14)  Ventolin Hfa 108 (90 Base) Mcg/act Aers (Albuterol sulfate) .... 2 puffs every 6 hours as needed for shortness of breath 15)  Ketoconazole 2 % Sham (Ketoconazole) .... Apply to scalp three times per week - let later and sit for 5 minutes then rinse  Other Orders: Capillary Blood Glucose/CBG [id number]) T-Lipid Profile [id number])  CHF Assessment/Plan:      The patient's current weight is 243.5 pounds.  Her previous weight was 244 pounds.    Diabetes Management Assessment/Plan:      The following lipid goals have been established for the patient: Total cholesterol goal of 200; LDL cholesterol goal of 100; HDL cholesterol goal of 40; Triglyceride goal of 150.  Her blood pressure goal is < 130/80.    Hypertension Assessment/Plan:      The patient's hypertensive risk group is category C: Target organ damage and/or diabetes.  Her  calculated 10 year risk of coronary heart disease is 11 %.  Today's blood pressure is 135/81.  Her blood pressure goal is < 130/80.  Lipid Assessment/Plan:      Based on NCEP/ATP III, the patient's risk factor category is "history of diabetes".  The patient's lipid goals are as follows: Total cholesterol goal is 200; LDL cholesterol goal is 100; HDL cholesterol goal is 40; Triglyceride goal is 150.    Patient Instructions: 1)  Your Hgba1c = 10.1 2)  This is improved because it was 12.5 on last visit 3)  Keep eating fresh fruit and baked food. 4)  Walking also helps 5)  Keep your appointment with cardiology on next week. 6)  Prescriptions have been updated at the pharmacy 7)  Follow up in November 2011 with n.martin,fnp for diabetes. 8)  You will need Hgba1c and flu vaccine Prescriptions: LANTUS 100 UNIT/ML  SOLN (INSULIN GLARGINE) 70 units at night for diabetes  #1 month qs x 11   Entered and Authorized by:   Aurora Mask FNP   Signed by:   Aurora Mask FNP on 05/07/2010   Method used:   Faxed to ...       [facility] (retail)  [place], [place]  [address]       Ph: [phone]       Fax: [phone]   RxID:   [id number] NOVOLOG MIX 70/30 FLEXPEN 70-30 % SUSP (INSULIN ASPART PROT & ASPART) 15 units before breakfast and 15 units before dinner  #1 month qs x 11   Entered and Authorized by:   Aurora Mask FNP   Signed by:   Aurora Mask FNP on 05/07/2010   Method used:   Faxed to ...       [facility] (retail)       [place], [place]  [address]       Ph: [phone]       Fax: [phone]   RxID:   [id number] SPIRONOLACTONE 25 MG  TABS (SPIRONOLACTONE) 1 tablet by mouth daily for fluid  #30 x 11   Entered and Authorized by:   Aurora Mask FNP   Signed by:   Aurora Mask FNP on 05/07/2010   Method used:   Faxed to ...        [facility] (retail)       [place], [place]  [address]       Ph: [phone]       Fax: [phone]   RxID:   [id number]    Last LDL:                                                 78 (07/29/2009 9:28:00 PM)        Diabetic Foot Exam    10-g (5.07) Semmes-Weinstein Monofilament Test Performed by: Isla Pence          Right Foot          Left Foot Visual Inspection               Test Control      normal         normal Site 1         normal         normal Site 2         normal         normal Site 3         normal         normal Site 4         normal         normal Site 5         normal         normal Site 6         normal         normal Site 7         normal         normal Site 8         normal         normal Site 9         normal         normal Site 10         normal         normal  Impression      normal  normal   Prevention & Chronic Care Immunizations   Influenza vaccine: Fluvax 3+  (07/29/2009)    Tetanus booster: 10/03/2001: historical per pt    Pneumococcal vaccine: Pneumovax  (06/20/2008)  Colorectal Screening   Hemoccult: Not documented    Colonoscopy: Not documented  Other Screening   Pap smear: Not documented   Pap smear action/deferral: Not indicated-other  (05/07/2010)    Mammogram: ASSESSMENT: Negative - BI-RADS 1^MM DIGITAL SCREENING  (05/04/2009)   Mammogram action/deferral: Ordered  (05/07/2010)   Smoking status: current  (05/07/2010)   Smoking cessation counseling: yes  (08/23/2007)  Diabetes Mellitus   HgbA1C: 10.1  (05/07/2010)   HgbA1C action/deferral: Ordered  (05/07/2010)   Hemoglobin A1C due: 08/07/2010    Eye exam: normal  (05/05/2008)    Foot exam: yes  (05/07/2010)   High risk foot: Not documented   Foot care education: Done  (03/12/2009)    Urine microalbumin/creatinine ratio: Not documented  Lipids   Total Cholesterol: 160  (07/29/2009)    LDL: 78  (07/29/2009)   LDL Direct: 122.3  (03/06/2007)   HDL: 67  (07/29/2009)   Triglycerides: 74  (07/29/2009)    SGOT (AST): 20  (07/29/2009)   SGPT (ALT): 21  (07/29/2009) CMP ordered    Alkaline phosphatase: 98  (07/29/2009)   Total bilirubin: 0.4  (07/29/2009)  Hypertension   Last Blood Pressure: 135 / 81  (05/07/2010)   Serum creatinine: 0.7  (10/04/2009)   Serum potassium 3.3  (10/04/2009) CMP ordered   Self-Management Support :    Diabetes self-management support: Not documented    Hypertension self-management support: Not documented    Lipid self-management support: Not documented   Laboratory Results   Blood Tests   Date/Time Received: May 07, 2010  HGBA1C: 10.1%   (Normal Range: Non-Diabetic - 3-6%   Control Diabetic - 6-8%) CBG Random:: 209mg /dL

## 2010-11-04 NOTE — Letter (Signed)
Summary: [facility]   Imported By: Roland Earl 09/12/2007 16:01:22  _____________________________________________________________________  External Attachment:    Type:   Image     Comment:   External Document

## 2010-11-04 NOTE — Miscellaneous (Signed)
Summary: hx added  Clinical Lists Changes  Problems: Changed problem from LIPOMA (ICD-214.9) to LIPOMA (ICD-214.9) - excision of right thigh lipoma 12/12/07

## 2010-11-04 NOTE — Assessment & Plan Note (Signed)
Summary: Diabetes/HTN/Fatique   Vital Signs:  Patient Profile:   60 Years Old Female Height:     60 inches Weight:      241.5 pounds BMI:     47.34 BSA:     2.02 Temp:     97.5 degrees F oral Pulse rate:   72 / minute Pulse rhythm:   regular Resp:     16 per minute BP sitting:   130 / 90  (left arm) Cuff size:   large  Pt. in pain?   yes    Location:   leg  Vitals Entered By: Isla Pence (August 20, 2008 8:57 AM)              Is Patient Diabetic? Yes CBG Result 118 CBG Device ID [id number]  Does patient need assistance? Ambulation Normal Comments pt states that she is out of her medications and they told her at the pharmacy that she may be able to get them tomorrow or friday     Chief Complaint:  DM follow-up.  History of Present Illness:  Pt into the office for diabetes follow up. Last check pt was uncontrolled and she was advised to monitor diet and she is currently on insulin.    Obesity -Pt has actually gained weight since the last visit.  No meaningful exercise.  +Snoring; husband in the room and reports that he has to constantly wake her to take another +headache +fatique +obesity  CHF History:      Daily weights are not being checked.  She understands fluid management and sodium restriction.  The patient expresses understanding of the treatment plan and her medications.  She admits to being compliant with her medications.  Other comments include: pt is no longer checking daily weight - reports she needs a battery for her scale.  encouraged her to get battery.    Diabetes Management History:      The patient is a 60 years old female who comes in for evaluation of DM Type 2.  She has not been enrolled in the "Diabetic Education Program".  She states understanding of dietary principles but she is not following the appropriate diet.  No sensory loss is reported.  Self foot exams are not being performed.  She is checking home blood sugars.  She says that she is not  exercising regularly.        Hypoglycemic symptoms are not occurring.  No hyperglycemic symptoms are reported.  Other comments include: Pt has been of her medications this week.  She has already requested from the pharmacy. Checking blood sugar daily in the mornings. brings log into the office today.        Other questions/concerns include: HbgA1c-11.7 on 06/20/08.        Her home fasting blood sugars are as follows: highest: 370; lowest: 124.    Hypertension History:      She complains of headache, dyspnea with exertion, and peripheral edema.  Further comments include: sometimes early morning headache.        Positive major cardiovascular risk factors include diabetes, hyperlipidemia, hypertension, and family history for ischemic heart disease (females less than 21 years old & males less than 68 years old).  Negative major cardiovascular risk factors include female age less than 59 years old and non-tobacco-user status.        Positive history for target organ damage include cardiac end organ damage (either CHF or LVH).  Further assessment for target organ damage reveals no history of  ASHD, stroke/TIA, peripheral vascular disease, renal insufficiency, or hypertensive retinopathy.    Lipid Management History:      Positive NCEP/ATP III risk factors include diabetes, family history for ischemic heart disease (females less than 58 years old & males less than 55 years old), and hypertension.  Negative NCEP/ATP III risk factors include female age less than 14 years old, no history of early menopause without estrogen hormone replacement, HDL cholesterol greater than 60, non-tobacco-user status, no ASHD (atherosclerotic heart disease), no prior stroke/TIA, no peripheral vascular disease, and no history of aortic aneurysm.        The patient states that she knows about the "Therapeutic Lifestyle Change" diet.  Her compliance with the TLC diet is poor.  She expresses no side effects from her lipid-lowering  medication.          Prior Medications Reviewed Using: Medication Bottles  Updated Prior Medication List: GLUCOPHAGE 1000 MG  TABS (METFORMIN HCL) 1 tablet by mouth two times a day for blood sugar LANTUS 100 UNIT/ML  SOLN (INSULIN GLARGINE) 50 units at night for diabetes LISINOPRIL 40 MG  TABS (LISINOPRIL) 1 tablet by mouth daily for blood pressure SPIRONOLACTONE 25 MG  TABS (SPIRONOLACTONE) 1 tablet by mouth daily for fluid FUROSEMIDE 40 MG  TABS (FUROSEMIDE) 1 tablet by mouth daily for fluid BAYER LOW STRENGTH 81 MG  TBEC (ASPIRIN) 1 tablet by mouth daily for circulation NAPROSYN 500 MG  TABS (NAPROXEN) 1 tablet by mouth two times a day as needed for pain TOPROL XL 100 MG  TB24 (METOPROLOL SUCCINATE) 1 and 1/2 tablet by mouth daily CELEXA 20 MG  TABS (CITALOPRAM HYDROBROMIDE) 1 tablet by mouth daily for mood TRIAMCINOLONE ACETONIDE 0.1 %  OINT (TRIAMCINOLONE ACETONIDE) 1 application topically two times a day to affected area LIPITOR 10 MG  TABS (ATORVASTATIN CALCIUM) take 2 tablets at bedtime for cholesterol DARVOCET-N 100 100-650 MG TABS (PROPOXYPHENE N-APAP)  NEURONTIN 300 MG  CAPS (GABAPENTIN) 1 tablet by mouth at night  Current Allergies (reviewed today): No known allergies     Risk Factors: Tobacco use:  quit    Year quit:  10/2007 Drug use:  no HIV high-risk behavior:  no Caffeine use:  2 drinks per day Alcohol use:  no Exercise:  no Seatbelt use:  100 % Sun Exposure:  occasionally  Family History Risk Factors:    Family History of MI in females < 54 years old:  yes    Family History of MI in males < 34 years old:  yes  Mammogram History:    Date of Last Mammogram:  11/03/2006   Review of Systems  General      Complains of fatigue.  CV      Complains of fatigue and swelling of feet.  Resp      Complains of shortness of breath.      at times  GI      Denies abdominal pain, nausea, and vomiting.  Neuro      Complains of headaches.   Physical  Exam  General:     alert.  obese Eyes:     glasses Lungs:     normal breath sounds.   Heart:     soft, S1S2 brady Abdomen:     soft, non-tender, and normal bowel sounds.  obese  BS x 4 Msk:     up to exam table Neurologic:     alert & oriented X3.   Psych:     Oriented X3.  Diabetes Management Exam:    Foot Exam (with socks and/or shoes not present):       Sensory-Monofilament:          Left foot: normal          Right foot: normal       Nails:          Left foot: normal          Right foot: normal    Impression & Recommendations:  Problem # 1:  DM (ICD-250.00) pt on llst for flu vaccine still uncontrolled.  need to increase lantus to 60 units at night. advise dietary management. Her updated medication list for this problem includes:    Glucophage 1000 Mg Tabs (Metformin hcl) .[name]... 1 tablet by mouth two times a day for blood sugar    Lantus 100 Unit/ml Soln (Insulin glargine) .Marland KitchenMarland KitchenMarland KitchenMarland Kitchen 60 units at night for diabetes    Lisinopril 40 Mg Tabs (Lisinopril) .[name]... 1 tablet by mouth daily for blood pressure    Bayer Low Strength 81 Mg Tbec (Aspirin) .[name]... 1 tablet by mouth daily for circulation  Orders: Hemoglobin A1C [id number]) T-Comprehensive Metabolic Panel ([id number]) T-Lipid Profile [id number])   Problem # 2:  HYPERTENSION, BENIGN ESSENTIAL (ICD-401.1) stable Her updated medication list for this problem includes:    Lisinopril 40 Mg Tabs (Lisinopril) .[name]... 1 tablet by mouth daily for blood pressure    Spironolactone 25 Mg Tabs (Spironolactone) .[name]... 1 tablet by mouth daily for fluid    Furosemide 40 Mg Tabs (Furosemide) .[name]... 1 tablet by mouth daily for fluid    Toprol Xl 100 Mg Tb24 (Metoprolol succinate) .[name]... 1 and 1/2 tablet by mouth daily  Orders: T-Comprehensive Metabolic Panel ([id number]) T-CBC w/Diff [id number]) Split Night (Split Night)   Problem # 3:  DEPRESSION (ICD-311) stable Her updated medication list for this problem includes:     Celexa 20 Mg Tabs (Citalopram hydrobromide) .[name]... 1 tablet by mouth daily for mood   Problem # 4:  OBESITY (ICD-278.00) gained weight since last visit.  advised to increase activity  Problem # 5:  FATIGUE (ICD-780.79) will check for sleep apnea Orders: Split Night (Split Night)   Problem # 6:  HYPERLIPIDEMIA (P102836.4) will check lipid panel today Her updated medication list for this problem includes:    Lipitor 10 Mg Tabs (Atorvastatin calcium) .[name]... Take 2 tablets at bedtime for cholesterol  Orders: T-Comprehensive Metabolic Panel ([id number]) T-Lipid Profile [id number]) T-CBC w/Diff [id number])   Complete Medication List: 1)  Glucophage 1000 Mg Tabs (Metformin hcl) .[name].. 1 tablet by mouth two times a day for blood sugar 2)  Lantus 100 Unit/ml Soln (Insulin glargine) .... 60 units at night for diabetes 3)  Lisinopril 40 Mg Tabs (Lisinopril) .[name].. 1 tablet by mouth daily for blood pressure 4)  Spironolactone 25 Mg Tabs (Spironolactone) .[name].. 1 tablet by mouth daily for fluid 5)  Furosemide 40 Mg Tabs (Furosemide) .[name].. 1 tablet by mouth daily for fluid 6)  Bayer Low Strength 81 Mg Tbec (Aspirin) .[name].. 1 tablet by mouth daily for circulation 7)  Naprosyn 500 Mg Tabs (Naproxen) .[name].. 1 tablet by mouth two times a day as needed for pain 8)  Toprol Xl 100 Mg Tb24 (Metoprolol succinate) .[name].. 1 and 1/2 tablet by mouth daily 9)  Celexa 20 Mg Tabs (Citalopram hydrobromide) .[name].. 1 tablet by mouth daily for mood 10)  Triamcinolone Acetonide 0.1 % Oint (Triamcinolone acetonide) .[name].. 1 application topically two times a day to affected area 11)  Lipitor  10 Mg Tabs (Atorvastatin calcium) .... Take 2 tablets at bedtime for cholesterol 12)  Darvocet-n 100 100-650 Mg Tabs (Propoxyphene n-apap) 13)  Neurontin 300 Mg Caps (Gabapentin) .[name].. 1 tablet by mouth at night  Other Orders: Capillary Blood Glucose [id number]) Fingerstick [id number])  CHF Assessment/Plan:      The patient's current weight is  241.5 pounds.  Her previous weight was 238 pounds.    Diabetes Management Assessment/Plan:      The following lipid goals have been established for the patient: Total cholesterol goal of 200; LDL cholesterol goal of 100; HDL cholesterol goal of 40; Triglyceride goal of 150.  Her blood pressure goal is < 130/80.    Hypertension Assessment/Plan:      The patient's hypertensive risk group is category C: Target organ damage and/or diabetes.  Her calculated 10 year risk of coronary heart disease is 9 %.  Today's blood pressure is 130/90.  Her blood pressure goal is < 130/80.  Lipid Assessment/Plan:      Based on NCEP/ATP III, the patient's risk factor category is "history of diabetes".  From this information, the patient's calculated lipid goals are as follows: Total cholesterol goal is 200; LDL cholesterol goal is 100; HDL cholesterol goal is 40; Triglyceride goal is 150.     Patient Instructions: 1)  If this office does get the flu vaccine you will be notified.  Be sure you are on the list. 2)  You will be scheduled for sleep study and notified of the time and date of appointment 3)  Diabetes - still uncontrolled.  need to increase lantus to 60units nighlty. You need better control of diet. 4)  Call for a follow up in 8 weeks (end of January)   ]  Last LDL:                                                 77 (02/11/2008 10:41:00 PM)        Diabetic Foot Exam    10-g (5.07) Semmes-Weinstein Monofilament Test Performed by: Isla Pence          Right Foot          Left Foot Visual Inspection               Test Control      normal         normal Site 1         normal         normal Site 2         normal         normal Site 3         normal         normal Site 4         normal         normal Site 5         normal         normal Site 6         normal         normal Site 7         normal         normal Site 8         normal         normal Site 9  normal         normal Site  10         normal         normal  Impression      normal         normal   Laboratory Results   Blood Tests   Date/Time Received: August 20, 2008 time   HGBA1C: 10.1%   (Normal Range: Non-Diabetic - 3-6%   Control Diabetic - 6-8%) CBG Random:: 118      Laboratory Results   Blood Tests     HGBA1C: 10.1%   (Normal Range: Non-Diabetic - 3-6%   Control Diabetic - 6-8%) CBG Random: 118

## 2010-11-04 NOTE — Miscellaneous (Signed)
Summary: Dx - Sleep Apnea  Phone Note Outgoing Call   Summary of Call: notify pt that she does have MILD sleep apnea. She did not have enough episodes to fit for a machine it does appear to have some relation to sleeping on her back so she may try to sleep on her side of course weight loss would help take some of the pressure off her diaphragm Initial call taken by: Aurora Mask FNP,  October 21, 2008 2:15 PM  Follow-up for Phone Call        discussed with pt who is into the office today Follow-up by: Aurora Mask FNP,  October 27, 2008 10:33 AM  New Problems: SLEEP APNEA (ICD-780.57)   New Problems: SLEEP APNEA (ICD-780.57) Clinical Lists Changes  Problems: Changed problem from SNORING (ICD-786.09) to SLEEP APNEA (ICD-780.57) - per sleep study - mild obstructive

## 2010-11-04 NOTE — Letter (Signed)
Summary: PROJECT ACCESS  PROJECT ACCESS   Imported By: Roland Earl 10/16/2007 08:58:27  _____________________________________________________________________  External Attachment:    Type:   Image     Comment:   External Document

## 2010-11-04 NOTE — Letter (Signed)
Summary: REACTIVE AIRWAY DISEASE  REACTIVE AIRWAY DISEASE   Imported By: Roland Earl 12/21/2009 10:45:48  _____________________________________________________________________  External Attachment:    Type:   Image     Comment:   External Document

## 2010-11-04 NOTE — Letter (Signed)
Summary: FAXED RECORDS TO [facility] 11/05/208  FAXED RECORDS TO [facility] 11/05/208   Imported By: Adela Lank [place] 08/08/2007 12:21:01  _____________________________________________________________________  External Attachment:    Type:   Image     Comment:   External Document

## 2010-11-04 NOTE — Assessment & Plan Note (Signed)
Summary: Diabetes   Vital Signs:  Patient Profile:   60 Years Old Female Height:     60 inches Weight:      236.5 pounds BMI:     46.36 BSA:     2.01 Temp:     97.0 degrees F oral Pulse rate:   72 / minute Pulse rhythm:   regular Resp:     16 per minute BP sitting:   120 / 80  (left arm) Cuff size:   large  Pt. in pain?   no  Vitals Entered By: Isla Pence (March 24, 2008 9:47 AM)              Is Patient Diabetic? Yes  CBG Result 233 CBG Device ID B  Does patient need assistance? Ambulation Normal Comments pt brought medications today     Chief Complaint:  DM 6 week follow-up.  History of Present Illness:  Pt into the office for follow up on diabetes.  Lantus was increased on last visit.  Reports that she is still having some dietary indescretions. She presents today with blood sugar log.  Pt reports that she has been doing relatively well.  Obesity - This provider has been speaking to pt about weight loss on previous visits to control symptoms.  5 pounds down since last visit.  CHF History:      She denies headache, chest pain, and palpitations.  Daily weights are not being checked.  She denies any side effects from her medications.    Diabetes Management History:      The patient is a 60 years old female who comes in for evaluation of DM Type 2.  She has not been enrolled in the "Diabetic Education Program".  She states understanding of dietary principles but she is not following the appropriate diet.  No sensory loss is reported.  Self foot exams are being performed.  She is checking home blood sugars.  She says that she is not exercising regularly.        Hypoglycemic symptoms are not occurring.  Other comments include: lanus was increased to 40  units on last visit 6 weeks ago.        There are no symptoms to suggest diabetic complications.  Other questions/concerns include: Pt reports that she is eating sugar free ice cream.  No changes have been made to  her treatment plan since last visit.        Her home fasting blood sugars are as follows: highest: 374; lowest: 111.    Hypertension History:      She denies headache, chest pain, and palpitations.  She notes no problems with any antihypertensive medication side effects.  Further comments include: Pt has made changes to her blood pressue as per cardiology. Pt did increase toprol to 150mg  by mouth daily and she decreased lisinopril as ordered.        Positive major cardiovascular risk factors include diabetes, hyperlipidemia, hypertension, and family history for ischemic heart disease (females less than 41 years old & males less than 60 years old).  Negative major cardiovascular risk factors include female age less than 42 years old and non-tobacco-user status.        Positive history for target organ damage include cardiac end organ damage (either CHF or LVH).  Further assessment for target organ damage reveals no history of ASHD, stroke/TIA, peripheral vascular disease, renal insufficiency, or hypertensive retinopathy.    Lipid Management History:      Positive NCEP/ATP III  risk factors include diabetes, family history for ischemic heart disease (females less than 33 years old & males less than 48 years old), and hypertension.  Negative NCEP/ATP III risk factors include female age less than 71 years old, no history of early menopause without estrogen hormone replacement, HDL cholesterol greater than 60, non-tobacco-user status, no ASHD (atherosclerotic heart disease), no prior stroke/TIA, no peripheral vascular disease, and no history of aortic aneurysm.        The patient states that she knows about the "Therapeutic Lifestyle Change" diet.  Her compliance with the TLC diet is poor.  Adjunctive measures started by the patient include ASA and limit alcohol consumpton.  She expresses no side effects from her lipid-lowering medication.  The patient denies any symptoms to suggest myopathy or liver disease  from her "statin" therapy.          Current Allergies: No known allergies     Risk Factors: Tobacco use:  quit    Year quit:  10/2007 Drug use:  no HIV high-risk behavior:  no Caffeine use:  2 drinks per day Alcohol use:  no Exercise:  no Seatbelt use:  100 % Sun Exposure:  occasionally  Family History Risk Factors:    Family History of MI in females < 26 years old:  yes    Family History of MI in males < 20 years old:  yes  Mammogram History:    Date of Last Mammogram:  11/03/2006   Review of Systems  CV      Denies chest pain or discomfort and fatigue.  Resp      Denies shortness of breath.   Physical Exam  General:     alert and overweight-appearing.   Head:     normocephalic.   Eyes:     glasses Lungs:     normal breath sounds.   Heart:     soft, S1S2 Abdomen:     soft, non-tender, and normal bowel sounds.   Msk:     up to exam table Extremities:     trace left pedal edema and trace right pedal edema.   Neurologic:     alert & oriented X3.   Skin:     color normal.   Psych:     Oriented X3.    Diabetes Management Exam:    Foot Exam (with socks and/or shoes not present):       Sensory-Monofilament:          Left foot: normal          Right foot: normal Last LDL:                                                 77 (02/11/2008 10:41:00 PM)      Diabetic Foot Exam Foot Inspection Is there a history of a foot ulcer?              No Is there a foot ulcer now?              No Can the patient see the bottom of their feet?          No Are the shoes appropriate in style and fit?          Yes Is there swelling or an abnormal foot shape?  Yes Are the toenails long?                No Are the toenails thick?                Yes Are the toenails ingrown?              No Is there heavy callous build-up?              No Is there a claw toe deformity?                          No Is there elevated skin temperature?            No Is there  limited ankle dorsiflexion?            No Is there foot or ankle muscle weakness?            No Do you have pain in calf while walking?           No      Pulse Check          Right Foot          Left Foot Dorsalis Pedis:        2+            2+    10-g (5.07) Semmes-Weinstein Monofilament Test           Right Foot          Left Foot Visual Inspection               Test Control      normal         normal Site 1         normal         normal Site 2         normal         normal Site 3         normal         normal Site 4         normal         normal Site 5         normal         normal Site 6         normal         normal Site 7         normal         normal Site 8         normal         normal Site 9         normal         normal Site 10         normal         normal  Impression      normal         normal    Impression & Recommendations:  Problem # 1:  DM (ICD-250.00) Still with some elevations in blood sugar pt still with poor diet habits.  Advised pt that she needs to monitor diet otherwise will have have to continue to increase insulin Her updated medication list for this problem includes:    Glucophage 1000 Mg Tabs (Metformin hcl) .[name]... 1 tablet by mouth two times a day for blood sugar    Lantus 100 Unit/ml Soln (Insulin glargine) .Marland KitchenMarland KitchenMarland KitchenMarland Kitchen 40 units  at night for diabetes    Lisinopril 40 Mg Tabs (Lisinopril) .[name]... 1 tablet by mouth daily for blood pressure    Bayer Low Strength 81 Mg Tbec (Aspirin) .[name]... 1 tablet by mouth daily for circulation   Problem # 2:  HYPERTENSION, BENIGN ESSENTIAL (ICD-401.1) Bp is better. Meds increased per cardiolgy. Her updated medication list for this problem includes:    Lisinopril 40 Mg Tabs (Lisinopril) .[name]... 1 tablet by mouth daily for blood pressure    Spironolactone 25 Mg Tabs (Spironolactone) .[name]... 1 tablet by mouth daily for fluid    Furosemide 40 Mg Tabs (Furosemide) .[name]... 1 tablet by mouth daily for fluid    Toprol Xl 100 Mg  Tb24 (Metoprolol succinate) .[name]... 1 and 1/2 tablet by mouth daily   Problem # 3:  CONGESTIVE HEART FAILURE, SYSTOLIC DYSFUNCTION (A999333) stable Her updated medication list for this problem includes:    Lisinopril 40 Mg Tabs (Lisinopril) .[name]... 1 tablet by mouth daily for blood pressure    Spironolactone 25 Mg Tabs (Spironolactone) .[name]... 1 tablet by mouth daily for fluid    Furosemide 40 Mg Tabs (Furosemide) .[name]... 1 tablet by mouth daily for fluid    Bayer Low Strength 81 Mg Tbec (Aspirin) .[name]... 1 tablet by mouth daily for circulation    Toprol Xl 100 Mg Tb24 (Metoprolol succinate) .[name]... 1 and 1/2 tablet by mouth daily   Problem # 4:  OBESITY (ICD-278.00) 5 pounds down since last visit pt still needs to lose more but congrats on current effort  Complete Medication List: 1)  Glucophage 1000 Mg Tabs (Metformin hcl) .[name].. 1 tablet by mouth two times a day for blood sugar 2)  Lantus 100 Unit/ml Soln (Insulin glargine) .... 40 units at night for diabetes 3)  Lisinopril 40 Mg Tabs (Lisinopril) .[name].. 1 tablet by mouth daily for blood pressure 4)  Simvastatin 40 Mg Tabs (Simvastatin) .[name].. 1 tablet by mouth at night for cholesterol 5)  Spironolactone 25 Mg Tabs (Spironolactone) .[name].. 1 tablet by mouth daily for fluid 6)  Furosemide 40 Mg Tabs (Furosemide) .[name].. 1 tablet by mouth daily for fluid 7)  Bayer Low Strength 81 Mg Tbec (Aspirin) .[name].. 1 tablet by mouth daily for circulation 8)  Naprosyn 500 Mg Tabs (Naproxen) .[name].. 1 tablet by mouth two times a day as needed for pain 9)  Toprol Xl 100 Mg Tb24 (Metoprolol succinate) .[name].. 1 and 1/2 tablet by mouth daily 10)  Celexa 20 Mg Tabs (Citalopram hydrobromide) .[name].. 1 tablet by mouth daily for mood 11)  Triamcinolone Acetonide 0.1 % Oint (Triamcinolone acetonide) .[name].. 1 application topically two times a day to affected area 12)  Lipitor 10 Mg Tabs (Atorvastatin calcium) .... Take 2 tablets at bedtime for cholesterol 13)  Darvocet-n 100 100-650 Mg Tabs  (Propoxyphene n-apap) 14)  Neurontin 300 Mg Caps (Gabapentin) .[name].. 1 tablet by mouth at night  Other Orders: Capillary Blood Glucose [id number]) Fingerstick [id number])  CHF Assessment/Plan:      The patient's current weight is 236.5 pounds.  Her previous weight was 241 pounds.    Diabetes Management Assessment/Plan:      The following lipid goals have been established for the patient: Total cholesterol goal of 200; LDL cholesterol goal of 100; HDL cholesterol goal of 40; Triglyceride goal of 150.  Her blood pressure goal is < 130/80.    Hypertension Assessment/Plan:      The patient's hypertensive risk group is category C: Target organ damage and/or diabetes.  Her calculated 10  year risk of coronary heart disease is 7 %.  Today's blood pressure is 120/80.  Her blood pressure goal is < 130/80.  Lipid Assessment/Plan:      Based on NCEP/ATP III, the patient's risk factor category is "history of diabetes".  From this information, the patient's calculated lipid goals are as follows: Total cholesterol goal is 200; LDL cholesterol goal is 100; HDL cholesterol goal is 40; Triglyceride goal is 150.     Patient Instructions: 1)  Please monitor your diet and make better food choices.   2)  Blood sugar is still elevated.  Remember you should strive to get fasting blood sugars in 80-120's. 3)  Will check your hgba1c (3 month) value on your next visit. 4)  Will compare with your last check and if elevated will need further insulin adjustment.  May consider sliding scale insulin. 5)  Congrats on 5 pound weight loss.  Keep up the good work. 6)  Call at end of August for a follow up appointment for diabetes.  Continue to monitor blood sugar daily before breakfast.   ]

## 2010-11-04 NOTE — Letter (Signed)
Summary: TEST ORDER FORM//PFT BASLINE & LUNG VOLUME//APPT DATE & TIME  TEST ORDER FORM//PFT BASLINE & LUNG VOLUME//APPT DATE & TIME   Imported By: Roland Earl 11/20/2009 12:24:31  _____________________________________________________________________  External Attachment:    Type:   Image     Comment:   External Document

## 2010-11-04 NOTE — Assessment & Plan Note (Signed)
Summary: Diabetes   Vital Signs:  Patient profile:   60 year old female Menstrual status:  hysterectomy Weight:      236.50 pounds BMI:     46.36 BSA:     2.01 Temp:     97.6 degrees F oral Pulse rate:   72 / minute Pulse rhythm:   regular Resp:     16 per minute BP sitting:   133 / 76  (left arm) Cuff size:   regular  Vitals Entered By: Isla Pence (April 27, 2009 9:04 AM) CC: follow-up visit, Hypertension Management, Lipid Management Pain Assessment Patient in pain? no      CBG Result 149 CBG Device ID B  Does patient need assistance? Functional Status Self care Ambulation Normal   CC:  follow-up visit, Hypertension Management, and Lipid Management.  History of Present Illness:  Pt into the office for diabetes Pt seen about 6 weeks ago and her diabetes was uncontrolled. Pt was advised to controll her diet. She has started to drink light lemonade and tropical punch. She also eats sugar free jello. She has some questions about carbs - potatos, rice.  Cardiology - f/u next month.   Pt presents today with her meds.  Obesity - down 1 pound since her last visit. pt has started to walk in the morning for about 15-20 minutes  CPE - last mammogram in 2008.s/p hysterectomy Tdap - thinks she had last in 2005 but will check with previous employer   Hypertension History:      She denies headache, chest pain, and palpitations.        Positive major cardiovascular risk factors include female age 47 years old or older, diabetes, hyperlipidemia, hypertension, and family history for ischemic heart disease (females less than 37 years old & males less than 37 years old).  Negative major cardiovascular risk factors include non-tobacco-user status.        Positive history for target organ damage include cardiac end organ damage (either CHF or LVH).  Further assessment for target organ damage reveals no history of ASHD, stroke/TIA, peripheral vascular disease, renal  insufficiency, or hypertensive retinopathy.    Lipid Management History:      Positive NCEP/ATP III risk factors include female age 41 years old or older, diabetes, family history for ischemic heart disease (females less than 56 years old & males less than 101 years old), and hypertension.  Negative NCEP/ATP III risk factors include no history of early menopause without estrogen hormone replacement, non-tobacco-user status, no ASHD (atherosclerotic heart disease), no prior stroke/TIA, no peripheral vascular disease, and no history of aortic aneurysm.        The patient states that she knows about the "Therapeutic Lifestyle Change" diet.  Her compliance with the TLC diet is poor.       Habits & Providers  Alcohol-Tobacco-Diet     Alcohol drinks/day: 0     Tobacco Status: quit < 6 months     Tobacco Counseling: to remain off tobacco products     Cigarette Packs/Day: 2-3cig     Year Quit: 10/2007  Exercise-Depression-Behavior     Does Patient Exercise: yes     Exercise Counseling: to improve exercise regimen     Type of exercise: walking     Exercise (avg: min/session): <30     Times/week: <3     Have you felt down or hopeless? no     Have you felt little pleasure in things? no     Drug Use:  no     Seat Belt Use: 100     Sun Exposure: occasionally  Allergies (verified): 1)  ! * Actos  Social History: Does Patient Exercise:  yes Smoking Status:  quit < 6 months  Review of Systems CV:  Denies chest pain or discomfort. Resp:  Denies cough and wheezing. GI:  Denies abdominal pain, nausea, and vomiting.  Physical Exam  General:  alert.   Head:  normocephalic.   Eyes:  glasses Lungs:  normal breath sounds.   Heart:  normal rate and regular rhythm.   Abdomen:  obese Msk:  up to exam Neurologic:  alert & oriented X3.     Impression & Recommendations:  Problem # 1:  DM (ICD-250.00) improved but still uncontrolled advised pt to improve exercise complements on changes in  diet continue current meds Her updated medication list for this problem includes:    Lantus 100 Unit/ml Soln (Insulin glargine) .Marland KitchenMarland KitchenMarland KitchenMarland Kitchen 70 units at night for diabetes    Lisinopril 40 Mg Tabs (Lisinopril) .[name]... 1 tablet by mouth daily for blood pressure    Bayer Low Strength 81 Mg Tbec (Aspirin) .[name]... 1 tablet by mouth daily for circulation    Novolog Mix 70/30 Flexpen 70-30 % Susp (Insulin aspart prot & aspart) .Marland KitchenMarland KitchenMarland KitchenMarland Kitchen 15 units before breakfast and 15 units before dinner  Orders: Hemoglobin A1C (83036)  Problem # 2:  HYPERTENSION, BENIGN ESSENTIAL (ICD-401.1)  Her updated medication list for this problem includes:    Lisinopril 40 Mg Tabs (Lisinopril) .[name]... 1 tablet by mouth daily for blood pressure    Spironolactone 25 Mg Tabs (Spironolactone) .[name]... 1 tablet by mouth daily for fluid    Furosemide 40 Mg Tabs (Furosemide) .[name]... 1 tablet by mouth daily for fluid    Toprol Xl 200 Mg Xr24h-tab (Metoprolol succinate) ..... One tablet by mouth daily for blood pressure  Problem # 3:  OBESITY (ICD-278.00) advised increase in exercise  Problem # 4:  CONGESTIVE HEART FAILURE, SYSTOLIC DYSFUNCTION (A999333) pt to f/u with cardiology Her updated medication list for this problem includes:    Lisinopril 40 Mg Tabs (Lisinopril) .[name]... 1 tablet by mouth daily for blood pressure    Spironolactone 25 Mg Tabs (Spironolactone) .[name]... 1 tablet by mouth daily for fluid    Furosemide 40 Mg Tabs (Furosemide) .[name]... 1 tablet by mouth daily for fluid    Bayer Low Strength 81 Mg Tbec (Aspirin) .[name]... 1 tablet by mouth daily for circulation    Toprol Xl 200 Mg Xr24h-tab (Metoprolol succinate) ..... One tablet by mouth daily for blood pressure  Problem # 5:  HYPERLIPIDEMIA (ICD-272.4) will check on next visit Her updated medication list for this problem includes:    Lipitor 20 Mg Tabs (Atorvastatin calcium) ..... One tablet by mouth nightly for cholesterol  Complete Medication List: 1)  Lantus 100 Unit/ml Soln  (Insulin glargine) .... 70 units at night for diabetes 2)  Lisinopril 40 Mg Tabs (Lisinopril) .[name].. 1 tablet by mouth daily for blood pressure 3)  Spironolactone 25 Mg Tabs (Spironolactone) .[name].. 1 tablet by mouth daily for fluid 4)  Furosemide 40 Mg Tabs (Furosemide) .[name].. 1 tablet by mouth daily for fluid 5)  Bayer Low Strength 81 Mg Tbec (Aspirin) .[name].. 1 tablet by mouth daily for circulation 6)  Naprosyn 500 Mg Tabs (Naproxen) .[name].. 1 tablet by mouth two times a day as needed for pain 7)  Toprol Xl 200 Mg Xr24h-tab (Metoprolol succinate) .... One tablet by mouth daily for blood pressure 8)  Celexa  20 Mg Tabs (Citalopram hydrobromide) .[name].. 1 tablet by mouth daily for mood 9)  Triamcinolone Acetonide 0.1 % Oint (Triamcinolone acetonide) .[name].. 1 application topically two times a day to affected area 10)  Lipitor 20 Mg Tabs (Atorvastatin calcium) .... One tablet by mouth nightly for cholesterol 11)  Neurontin 300 Mg Caps (Gabapentin) .[name].. 1 tablet by mouth at night 12)  Novolog Mix 70/30 Flexpen 70-30 % Susp (Insulin aspart prot & aspart) .[name].. 15 units before breakfast and 15 units before dinner 13)  Pen Needles 31g X 6 Mm Misc (Insulin pen needle) .... To use with novolog insulin two times a day  Other Orders: Capillary Blood Glucose/CBG [id number]) Mammogram (Screening) (Mammo) Dexa scan (Dexa scan)  Hypertension Assessment/Plan:      The patient's hypertensive risk group is category C: Target organ damage and/or diabetes.  Her calculated 10 year risk of coronary heart disease is 11 %.  Today's blood pressure is 133/76.  Her blood pressure goal is < 130/80.  Lipid Assessment/Plan:      Based on NCEP/ATP III, the patient's risk factor category is "history of diabetes".  The patient's lipid goals are as follows: Total cholesterol goal is 200; LDL cholesterol goal is 100; HDL cholesterol goal is 40; Triglyceride goal is 150.    Patient Instructions: 1)  Your blood sugar is doing better 2)  Continue  the insulin at current dose. 3)  Exercise - walking also helps your blood sugar and blood pressure so keep it up 4)  Keep your appointment with cardiology 5)  Follow up in October for diabetes. 6)  Come fasting for labs 7)  Will need Hgba1c, lipids, cbc, cmp, tsh,hiv 8)  **review tetanus status** Prescriptions: NOVOLOG MIX 70/30 FLEXPEN 70-30 % SUSP (INSULIN ASPART PROT & ASPART) 15 units before breakfast and 15 units before dinner  #1 month qs x 5   Entered and Authorized by:   Aurora Mask FNP   Signed by:   Aurora Mask FNP on 04/27/2009   Method used:   Printed then faxed to ...       [facility] (retail)       [place], [place]  [address]       Ph: [phone]       Fax: [phone]   RxID:   [id number] LANTUS 100 UNIT/ML  SOLN (INSULIN GLARGINE) 70 units at night for diabetes  #1 month qs x 5   Entered and Authorized by:   Aurora Mask FNP   Signed by:   Aurora Mask FNP on 04/27/2009   Method used:   Printed then faxed to ...       [facility] (retail)       [place],   [address]       Ph: [phone] [place]       Fax: [phone]   RxID:   [id number]   Laboratory Results   Urine Tests  Date/Time Received: April 27, 2009 9:33 AM  Date/Time Reported: April 27, 2009 9:33 AM   Routine Urinalysis   Color: yellow Appearance: Clear Glucose: negative   (Normal Range: Negative) Bilirubin: negative   (Normal Range: Negative) Ketone: negative   (Normal Range: Negative) Spec. Gravity: 1.015   (Normal Range: 1.003-1.035) Blood: negative   (Normal Range: Negative) pH: 5.0   (Normal Range: 5.0-8.0) Protein: negative   (Normal Range: Negative)  Urobilinogen: 0.2   (Normal Range: 0-1) Nitrite: negative   (Normal Range: Negative) Leukocyte Esterace: negative   (Normal Range: Negative)     Blood Tests   Date/Time Received: April 27, 2009 9:34 AM  Date/Time Reported: April 27, 2009 9:34 AM   HGBA1C: 10.0%   (Normal Range: Non-Diabetic - 3-6%   Control Diabetic - 6-8%) CBG Random:: 149      Last LDL:                                                 80 (08/20/2008 11:06:00 PM)      Diabetic Foot Exam Foot Inspection Is there a history of a foot ulcer?              No Is there a foot ulcer now?              No Can the patient see the bottom of their feet?          Yes Are the shoes appropriate in style and fit?          Yes Is there swelling or an abnormal foot shape?          No Are the toenails long?                No Are the toenails thick?                Yes Are the toenails ingrown?              No Is there heavy callous build-up?              No Is there a claw toe deformity?                          No Is there elevated skin temperature?            No Is there limited ankle dorsiflexion?            No Is there foot or ankle muscle weakness?            No Do you have pain in calf while walking?           No      Pulse Check          Right Foot          Left Foot Dorsalis Pedis:        2+            2+

## 2010-11-04 NOTE — Cardiovascular Report (Signed)
Summary: Office Visit   Office Visit   Imported By: Sallee Provencal 02/16/2010 12:10:37  _____________________________________________________________________  External Attachment:    Type:   Image     Comment:   External Document

## 2010-11-04 NOTE — Assessment & Plan Note (Signed)
Summary: DM/lipoma/HTN   Vital Signs:  Patient Profile:   60 Years Old Female Height:     60 inches Weight:      223 pounds BMI:     43.71 BSA:     1.96 Temp:     97.7 degrees F oral Pulse rate:   72 / minute Pulse rhythm:   regular Resp:     16 per minute BP sitting:   138 / 92  (left arm) Cuff size:   regular  Pt. in pain?   no  Vitals Entered By: Isla Pence (September 21, 2007 2:03 PM)              Is Patient Diabetic? Yes  CBG Result 184 CBG Device ID B  Does patient need assistance? Ambulation Normal     Chief Complaint:  follow-up for DM, cold , cough, and RX refills.  History of Present Illness:  pt into the office for f/u.  DM - pt was started on lantus 20 units Subcutaneously on last visit.  pt was to monitor diet and bring blood sugar log into the office.  pt reports that her blood sugars have been doing better on the lantus.  Right left lipoma - pt was seen by derm and dx confirmed.  Pt states that she was to be referred for surgical eval. Pt has recieved the surgical consult.  Explained to pt that this may be concerned cosmetic and may not be covered.  CHF - no cardiology visit since she was seen her last.  Pt is still taking meds  URI - pt reports that she has had a cough that is mostly at night.  non-productive. no headache, no sore throat, no ear ache  Diabetes Management History:      The patient is a 60 years old female who comes in for evaluation of DM Type 2.  She has not been enrolled in the "Diabetic Education Program".  She states lack of understanding of dietary principles and is not following her diet appropriately.  No sensory loss is reported.  Self foot exams are being performed.  She is checking home blood sugars.  She says that she is not exercising regularly.        No hyperglycemic symptoms are reported.  Other comments include: Blood sugar log 164, 208, 212, 230, 186, 244, 172, 165, 173, 188, 200, 201, 200, 212, 125, 120, 118, 151,  156, 239, 129, 149, 153, 150, 157, 201, 163, 155, 173, 203, 189, 226.     Current Allergies: No known allergies     Risk Factors:  Caffeine use:  2 drinks per day Seatbelt use:  100 % Sun Exposure:  occasionally   Review of Systems  General      Complains of loss of appetite.      Denies chills, fatigue, and fever.  ENT      Denies earache and sore throat.  CV      Denies chest pain or discomfort, fatigue, and palpitations.  Resp      Complains of cough.      Denies wheezing.  GI      Denies abdominal pain, diarrhea, nausea, and vomiting.  GU      Denies discharge.  MS      Denies joint pain.  Derm      Denies dryness.   Physical Exam  General:     alert and overweight-appearing.   Head:     normocephalic.   Eyes:  glasses Ears:     R ear normal and L ear normal.   Mouth:     edentulous pharynx pink and moist.   white plaque on tongue Neck:     supple.   Lungs:     normal breath sounds, no fremitus, no crackles, and no wheezes.   Heart:     soft S1 S2 Abdomen:     soft, non-tender, and normal bowel sounds.   Msk:     up to exam table Skin:     left shin ulcer healed  Diabetes Management Exam:    Foot Exam (with socks and/or shoes not present):       Sensory-Monofilament:          Left foot: normal          Right foot: normal       Inspection:          Left foot: abnormal             Comments: thick skin to heels          Right foot: abnormal             Comments: thick skin to heels       Nails:          Left foot: thickened          Right foot: thickened    Impression & Recommendations:  Problem # 1:  DM (ICD-250.00) incrase lantus to 30 units at night.  continue other meds Her updated medication list for this problem includes:    Glucophage 1000 Mg Tabs (Metformin hcl) .[name]... 1 tablet by mouth two times a day for blood sugar    Lantus 100 Unit/ml Soln (Insulin glargine) .Marland KitchenMarland KitchenMarland KitchenMarland Kitchen 30 units at night for diabetes     Lisinopril 20 Mg Tabs (Lisinopril) .[name]... 1 tablet by mouth two times a day for blood pressure    Bayer Low Strength 81 Mg Tbec (Aspirin) .[name]... 1 tablet by mouth daily for circulation   Problem # 2:  CONGESTIVE HEART FAILURE, SYSTOLIC DYSFUNCTION (A999333) continue f/u with cardiology Her updated medication list for this problem includes:    Lisinopril 20 Mg Tabs (Lisinopril) .[name]... 1 tablet by mouth two times a day for blood pressure    Spironolactone 25 Mg Tabs (Spironolactone) .[name]... 1 tablet by mouth daily for fluid    Furosemide 40 Mg Tabs (Furosemide) .[name]... 1 tablet by mouth daily for fluid    Bayer Low Strength 81 Mg Tbec (Aspirin) .[name]... 1 tablet by mouth daily for circulation    Toprol Xl 100 Mg Tb24 (Metoprolol succinate) .[name]... 1 once daily   Problem # 3:  LIPOMA (ICD-214.9) cautioned pt that ths may be considered cosmetic Orders: Surgical Referral (Surgery)   Problem # 4:  CANDIDIASIS, ORAL (ICD-112.0) will give nystatin swish and swallow  Complete Medication List: 1)  Glucophage 1000 Mg Tabs (Metformin hcl) .[name].. 1 tablet by mouth two times a day for blood sugar 2)  Lantus 100 Unit/ml Soln (Insulin glargine) .... 30 units at night for diabetes 3)  Lisinopril 20 Mg Tabs (Lisinopril) .[name].. 1 tablet by mouth two times a day for blood pressure 4)  Simvastatin 40 Mg Tabs (Simvastatin) .[name].. 1 tablet by mouth at night for cholesterol 5)  Spironolactone 25 Mg Tabs (Spironolactone) .[name].. 1 tablet by mouth daily for fluid 6)  Furosemide 40 Mg Tabs (Furosemide) .[name].. 1 tablet by mouth daily for fluid 7)  Bayer Low Strength 81 Mg Tbec (  Aspirin) .[name].. 1 tablet by mouth daily for circulation 8)  Naprosyn 500 Mg Tabs (Naproxen) .[name].. 1 tablet by mouth two times a day as needed for pain 9)  Toprol Xl 100 Mg Tb24 (Metoprolol succinate) .[name].. 1 once daily 10)  Mucinex 600 Mg Tb12 (Guaifenesin) .[name].. 1 tablet by mouth two times a day as needed for cough 11)  Nystatin 100000 Unit/ml Susp (Nystatin)  .[name].. 1 teaspoon three times a day for mouth sores  Other Orders: Capillary Blood Glucose [id number]) Fingerstick [id number])  Diabetes Management Assessment/Plan:      The following lipid goals have been established for the patient: Total cholesterol goal of 200; LDL cholesterol goal of 100; HDL cholesterol goal of 40; Triglyceride goal of 200.  Her blood pressure goal is < 130/80.     Patient Instructions: 1)  Follow up in 6 weeks 2)  Increase lantus to 30 units.  Continue to monitor blood sugar    Prescriptions: NYSTATIN 100000 UNIT/ML  SUSP (NYSTATIN) 1 teaspoon three times a day for mouth sores  #160ml x 0   Entered and Authorized by:   Aurora Mask FNP   Signed by:   Aurora Mask FNP on 09/21/2007   Method used:   Print then Give to Patient   RxID:   [id number] [place] 600 MG  TB12 (GUAIFENESIN) 1 tablet by mouth two times a day as needed for cough  #20 x 0   Entered and Authorized by:   Aurora Mask FNP   Signed by:   Aurora Mask FNP on 09/21/2007   Method used:   Print then Give to Patient   [id number]  ] Last LDL:                                                 95 (08/09/2007 11:41:00 AM)        Diabetic Foot Exam    10-g (5.07) Semmes-Weinstein Monofilament Test Performed by: Isla Pence          Right Foot          Left Foot Visual Inspection               Test Control      normal         normal Site 1         normal         normal Site 2         normal         normal Site 3         normal         normal Site 4         normal         normal Site 5         normal         normal Site 6         normal         normal Site 7         normal         normal Site 8         normal         normal Site 9         normal         normal Site 10  normal         normal  Impression      normal         normal

## 2010-11-04 NOTE — Letter (Signed)
Summary: APP FOR HANDICAPPED PLACECARD  APP FOR HANDICAPPED PLACECARD   Imported By: Roberto Scales 05/06/2009 11:25:35  _____________________________________________________________________  External Attachment:    Type:   Image     Comment:   External Document

## 2010-11-04 NOTE — Letter (Signed)
Summary: [facility]  [facility]   Imported By: Phillis Knack 10/09/2009 14:49:54  _____________________________________________________________________  External Attachment:    Type:   Image     Comment:   External Document

## 2010-11-04 NOTE — Letter (Signed)
Summary: FAXED REQUESTED RECORDS TO [facility] 11/14/07  FAXED REQUESTED RECORDS TO [facility] 11/14/07   Imported By: Adela Lank [place] 11/14/2007 15:19:01  _____________________________________________________________________  External Attachment:    Type:   Image     Comment:   External Document

## 2010-11-04 NOTE — Letter (Signed)
Summary: *HSN Results Follow up  [facility]  [address] [place], [place] [address]   Phone: [phone]  Fax: [phone]      06/09/2010   [place], [place]  [address]   Dear  Ms. Arlayne Viney,                            ____S.Drinkard,FNP   ____D. Gore,FNP       ____B. McPherson,MD   ____V. Rankins,MD    ____E. Mulberry,MD    __X__N. Hassell Done, FNP  ____D. Jobe Igo, MD    ____K. Tomma Lightning, MD    ____Other     This letter is to inform you that your recent test(s):  _______Pap Smear    ___X____Lab Test     _______X-ray    ___X____ is within acceptable limits  _______ requires a medication change  _______ requires a follow-up lab visit  _______ requires a follow-up visit with your provider   Comments: Labs done during recent office visis is normal.       _________________________________________________________ If you have any questions, please contact our office [phone].                    Sincerely,    Aurora Mask FNP [facility]

## 2010-11-04 NOTE — Assessment & Plan Note (Signed)
Summary: Diabetes   Vital Signs:  Patient Profile:   60 Years Old Female Height:     60 inches Weight:      241 pounds BMI:     47.24 BSA:     2.02 Temp:     97.4 degrees F oral Pulse rate:   76 / minute Pulse rhythm:   regular Resp:     16 per minute BP sitting:   140 / 100  (left arm) Cuff size:   large  Pt. in pain?   yes    Location:   legs  Vitals Entered By: Isla Pence (Feb 11, 2008 8:53 AM)              Is Patient Diabetic? Yes  CBG Result 174 CBG Device ID A  Does patient need assistance? Ambulation Normal Comments pt brought medications today.     Chief Complaint:  follow-up DM.  History of Present Illness:  Pt into the office for follow up of her diabetes.  ICD to be checked this week - next visit this week May 15th.   Dr. Stanford Breed - Cardiology she will f/u for 6 month check this month.  CHF History:      She complains of peripheral edema, but denies chest pain, palpitations, and orthopnea.  Daily weights are not being checked.  She understands fluid management and sodium restriction but is not following this regimen.  She admits to being compliant with her medications.    Diabetes Management History:      The patient is a 60 years old female who comes in for evaluation of DM Type 2.  She has not been enrolled in the "Diabetic Education Program".  She states understanding of dietary principles but she is not following the appropriate diet.  She is checking home blood sugars.  She says that she is not exercising regularly.        Hypoglycemic symptoms are not occurring.  No hyperglycemic symptoms are reported.  Other comments include: notes that blood sugar has been more elevated than ususal for since the last visit. notes some diet indescretions.        Other questions/concerns include: Admits to some dietary indescretions.  She brings blood sugar log into the office today.        Her home fasting blood sugars are as follows: highest: 224; lowest:  100.    Hypertension History:      She denies headache, chest pain, and palpitations.  She notes no problems with any antihypertensive medication side effects.  Further comments include: She has not taken her blood pressure medications as ordered.        Positive major cardiovascular risk factors include diabetes, hyperlipidemia, hypertension, and family history for ischemic heart disease (females less than 35 years old & males less than 46 years old).  Negative major cardiovascular risk factors include female age less than 28 years old and non-tobacco-user status.        Positive history for target organ damage include cardiac end organ damage (either CHF or LVH).  Further assessment for target organ damage reveals no history of ASHD, stroke/TIA, or peripheral vascular disease.         Updated Prior Medication List: GLUCOPHAGE 1000 MG  TABS (METFORMIN HCL) 1 tablet by mouth two times a day for blood sugar LANTUS 100 UNIT/ML  SOLN (INSULIN GLARGINE) 30 units at night for diabetes LISINOPRIL 40 MG  TABS (LISINOPRIL) 1 tablet by mouth two times a day for  blood pressure SIMVASTATIN 40 MG  TABS (SIMVASTATIN) 1 tablet by mouth at night for cholesterol SPIRONOLACTONE 25 MG  TABS (SPIRONOLACTONE) 1 tablet by mouth daily for fluid FUROSEMIDE 40 MG  TABS (FUROSEMIDE) 1 tablet by mouth daily for fluid BAYER LOW STRENGTH 81 MG  TBEC (ASPIRIN) 1 tablet by mouth daily for circulation NAPROSYN 500 MG  TABS (NAPROXEN) 1 tablet by mouth two times a day as needed for pain TOPROL XL 100 MG  TB24 (METOPROLOL SUCCINATE) 1 once daily CELEXA 20 MG  TABS (CITALOPRAM HYDROBROMIDE) 1 tablet by mouth daily for mood TRIAMCINOLONE ACETONIDE 0.1 %  OINT (TRIAMCINOLONE ACETONIDE) 1 application topically two times a day to affected area LIPITOR 10 MG  TABS (ATORVASTATIN CALCIUM) take 2 tablets at bedtime for cholesterol DARVOCET-N 100 100-650 MG TABS (PROPOXYPHENE N-APAP)  NEURONTIN 300 MG  CAPS (GABAPENTIN) 1 tablet by  mouth at night  Current Allergies (reviewed today): No known allergies     Risk Factors: Tobacco use:  quit    Year quit:  10/2007 Drug use:  no HIV high-risk behavior:  no Caffeine use:  2 drinks per day Alcohol use:  no Exercise:  no Seatbelt use:  100 % Sun Exposure:  occasionally  Family History Risk Factors:    Family History of MI in females < 31 years old:  yes    Family History of MI in males < 56 years old:  yes  Mammogram History:    Date of Last Mammogram:  11/03/2006    Physical Exam  General:     alert and overweight-appearing.   Head:     normocephalic.  thin hair Lungs:     normal breath sounds.   Heart:     soft, S1S2 Abdomen:     soft, non-tender, and normal bowel sounds.   Msk:     decreased in bil lower ext Extremities:     trace left pedal edema and trace right pedal edema.   Neurologic:     alert & oriented X3.  assistive device - cane Psych:     Oriented X3.    Diabetes Management Exam:    Foot Exam (with socks and/or shoes not present):       Sensory-Monofilament:          Left foot: normal          Right foot: normal       Nails:          Left foot: thickened          Right foot: thickened    Impression & Recommendations:  Problem # 1:  DM (ICD-250.00) Blood sugar needs better control Discussed with pt again dietary components. New blood sugar log given. Her updated medication list for this problem includes:    Glucophage 1000 Mg Tabs (Metformin hcl) .[name]... 1 tablet by mouth two times a day for blood sugar    Lantus 100 Unit/ml Soln (Insulin glargine) .Marland KitchenMarland KitchenMarland KitchenMarland Kitchen 40 units at night for diabetes    Lisinopril 40 Mg Tabs (Lisinopril) .[name]... 1 tablet by mouth two times a day for blood pressure    Bayer Low Strength 81 Mg Tbec (Aspirin) .[name]... 1 tablet by mouth daily for circulation  Orders: Hemoglobin A1C (83036) T-Lipid Profile [id number]) T-Comprehensive Metabolic Panel (A999333) T-CBC w/Diff [id number]) T-Urine  Microalbumin w/creat. ratio [id number] / [id number]) UA Dipstick w/o Micro (manual) (81002)   Problem # 2:  HYPERTENSION, BENIGN ESSENTIAL (ICD-401.1) No BP meds today.  Advised pt to take meds prior  to cardiology visit later this week.  Her updated medication list for this problem includes:    Lisinopril 40 Mg Tabs (Lisinopril) .[name]... 1 tablet by mouth two times a day for blood pressure    Spironolactone 25 Mg Tabs (Spironolactone) .[name]... 1 tablet by mouth daily for fluid    Furosemide 40 Mg Tabs (Furosemide) .[name]... 1 tablet by mouth daily for fluid    Toprol Xl 100 Mg Tb24 (Metoprolol succinate) .[name]... 1 once daily  Orders: Hemoglobin A1C (83036)   Problem # 3:  OBESITY (ICD-278.00) pt needs to lose weight.  Additionally she should be monitoring sodium.  Complete Medication List: 1)  Glucophage 1000 Mg Tabs (Metformin hcl) .[name].. 1 tablet by mouth two times a day for blood sugar 2)  Lantus 100 Unit/ml Soln (Insulin glargine) .... 40 units at night for diabetes 3)  Lisinopril 40 Mg Tabs (Lisinopril) .[name].. 1 tablet by mouth two times a day for blood pressure 4)  Simvastatin 40 Mg Tabs (Simvastatin) .[name].. 1 tablet by mouth at night for cholesterol 5)  Spironolactone 25 Mg Tabs (Spironolactone) .[name].. 1 tablet by mouth daily for fluid 6)  Furosemide 40 Mg Tabs (Furosemide) .[name].. 1 tablet by mouth daily for fluid 7)  Bayer Low Strength 81 Mg Tbec (Aspirin) .[name].. 1 tablet by mouth daily for circulation 8)  Naprosyn 500 Mg Tabs (Naproxen) .[name].. 1 tablet by mouth two times a day as needed for pain 9)  Toprol Xl 100 Mg Tb24 (Metoprolol succinate) .[name].. 1 once daily 10)  Celexa 20 Mg Tabs (Citalopram hydrobromide) .[name].. 1 tablet by mouth daily for mood 11)  Triamcinolone Acetonide 0.1 % Oint (Triamcinolone acetonide) .[name].. 1 application topically two times a day to affected area 12)  Lipitor 10 Mg Tabs (Atorvastatin calcium) .... Take 2 tablets at bedtime for cholesterol 13)  Darvocet-n 100 100-650 Mg Tabs  (Propoxyphene n-apap) 14)  Neurontin 300 Mg Caps (Gabapentin) .[name].. 1 tablet by mouth at night  Other Orders: Capillary Blood Glucose [id number]) Fingerstick [id number])  CHF Assessment/Plan:      The patient's current weight is 241 pounds.  Her previous weight was 235 pounds.    Diabetes Management Assessment/Plan:      The following lipid goals have been established for the patient: Total cholesterol goal of 200; LDL cholesterol goal of 100; HDL cholesterol goal of 40; Triglyceride goal of 150.  Her blood pressure goal is < 130/80.    Hypertension Assessment/Plan:      The patient's hypertensive risk group is category C: Target organ damage and/or diabetes.  Her calculated 10 year risk of coronary heart disease is 11 %.  Today's blood pressure is 140/100.  Her blood pressure goal is < 130/80.   Patient Instructions: 1)  You need better control of your blood sugar.   2)  You will also need to monitor salt intake and start weight reduction. 3)  Increase insulin to Lantus 40 units at night.   4)  Follow up in 6 weeks for diabetes with Marva Panda.   Prescriptions: TOPROL XL 100 MG  TB24 (METOPROLOL SUCCINATE) 1 once daily  #30 x 5   Entered and Authorized by:   Aurora Mask FNP   Signed by:   Aurora Mask FNP on 02/11/2008   Method used:   Print then Give to Patient   RxID:   [id number] SPIRONOLACTONE 25 MG  TABS (SPIRONOLACTONE) 1 tablet by mouth daily for fluid  #30 x 5   Entered and Authorized by:  Aurora Mask FNP   Signed by:   Aurora Mask FNP on 02/11/2008   Method used:   Print then Give to Patient   RxID:   [id number] BAYER LOW STRENGTH 81 MG  TBEC (ASPIRIN) 1 tablet by mouth daily for circulation  #30 x 5   Entered and Authorized by:   Aurora Mask FNP   Signed by:   Aurora Mask FNP on 02/11/2008   Method used:   Print then Give to Patient   RxID:   [id number]  ]  Last LDL:                                                 95 (08/09/2007  11:41:00 AM)        Diabetic Foot Exam    10-g (5.07) Semmes-Weinstein Monofilament Test Performed by: Isla Pence          Right Foot          Left Foot Visual Inspection               Test Control      normal         normal Site 1         normal         normal Site 2         normal         normal Site 3         normal         normal Site 4         normal         normal Site 5         normal         normal Site 6         normal         normal Site 7         normal         normal Site 8         normal         normal Site 9         normal         normal Site 10         normal         normal  Impression      normal         normal    Laboratory Results   Urine Tests  Date/Time Recieved: Feb 11, 2008 11:44 AM  Date/Time Reported: Feb 11, 2008 11:44 AM   Routine Urinalysis   Color: lt. yellow Appearance: Clear Glucose: negative   (Normal Range: Negative) Bilirubin: negative   (Normal Range: Negative) Ketone: negative   (Normal Range: Negative) Spec. Gravity: 1.015   (Normal Range: 1.003-1.035) Blood: negative   (Normal Range: Negative) pH: 6.0   (Normal Range: 5.0-8.0) Protein: trace   (Normal Range: Negative) Urobilinogen: 1.0   (Normal Range: 0-1) Nitrite: negative   (Normal Range: Negative) Leukocyte Esterace: negative   (Normal Range: Negative)     Blood Tests   Date/Time Recieved: Feb 11, 2008 9:57 AM   HGBA1C: 8.5%   (Normal Range: Non-Diabetic - 3-6%   Control Diabetic - 6-8%) CBG Random: 174     Laboratory Results   Urine Tests    Routine Urinalysis  Color: lt. yellow Appearance: Clear Glucose: negative   (Normal Range: Negative) Bilirubin: negative   (Normal Range: Negative) Ketone: negative   (Normal Range: Negative) Spec. Gravity: 1.015   (Normal Range: 1.003-1.035) Blood: negative   (Normal Range: Negative) pH: 6.0   (Normal Range: 5.0-8.0) Protein: trace   (Normal Range: Negative) Urobilinogen: 1.0   (Normal Range:  0-1) Nitrite: negative   (Normal Range: Negative) Leukocyte Esterace: negative   (Normal Range: Negative)     Blood Tests     HGBA1C: 8.5%   (Normal Range: Non-Diabetic - 3-6%   Control Diabetic - 6-8%) CBG Random:: 174

## 2010-11-04 NOTE — Progress Notes (Signed)
Summary: recent labs  Phone Note Call from Patient Call back at [facility] Phone [phone]   Summary of Call: Dr. Demaris Callander office needs the pt most current labs visit.  If you have any questions, please call back to Cristine at [facility] Initial call taken by: Alexis Goodell,  Feb 28, 2008 2:45 PM    FAXED RECENT LABS TO DR. CRENSHAW 02/29/08//SS

## 2010-11-04 NOTE — Assessment & Plan Note (Signed)
Summary: Diabetes/Lipoma/HTN   Vital Signs:  Patient Profile:   60 Years Old Female Weight:      230 pounds Temp:     98.1 degrees F oral Pulse rate:   78 / minute Pulse rhythm:   regular Resp:     20 per minute BP sitting:   140 / 102  (left arm)  Pt. in pain?   yes    Location:   back  Vitals Entered By: Vinetta Bergamo CMA (August 23, 2007 2:15 PM)              Is Patient Diabetic? Yes  CBG Result 172 CBG Device ID [id number]     Chief Complaint:  review lab test & recheck Rt thigh.  History of Present Illness:  Pt into the office for Diabetes f/u and right thgh lipoma. Pt has been to cardiology and report is in EMR.  Diabetes - pt into the office with her blood sugat log.  See DM Flowsheet.  pt was asked to check blood sugars every other day and bring values into the office. she notes that values have been elevated.  She is taking Lantus 14 units at night. she also takes glucophage 1 two times a day.  Lipoma - Pt notes that she has been taking the naprosyn as needed.  She reports that she has been putting heat on the affected area. She has never had the area biopsied.    Diabetes Management History:      The patient is a 60 years old female who comes in for evaluation of DM Type 2.  She has not been enrolled in the "Diabetic Education Program".  She states understanding of dietary principles but she is not following the appropriate diet.  No sensory loss is reported.  Self foot exams are being performed.  She is checking home blood sugars.  She says that she is not exercising regularly.        No hyperglycemic symptoms are reported.  Other comments include: Pt brings her blood sugar log into the office. Values are 210, 220, 208, 212, 221, 224, 224, 212, 234, 232, 241, 240, 178, 202, 202, 210, 164, 208, 212, 230, 186, 244.        There are no symptoms to suggest diabetic complications.  No changes have been made to her treatment plan since last visit.     Current Allergies: No  known allergies     Risk Factors:  Tobacco use:  current    Cigarettes:  Yes -- 1ppd pack(s) per day    Counseled to quit/cut down tobacco use:  yes   Review of Systems  General      Denies chills, fatigue, and fever.  ENT      Denies decreased hearing, earache, and sore throat.  CV      Complains of swelling of feet.      Denies chest pain or discomfort and fatigue.  Resp      Denies chest discomfort, cough, and shortness of breath.  GI      Denies abdominal pain, nausea, and vomiting.  Derm      Right thigh   Neuro      Denies brief paralysis, headaches, and tremors.  Psych      Denies anxiety and panic attacks.   Physical Exam  General:     alert and overweight-appearing.   Head:     normocephalic.  thin hair Eyes:     glasses Ears:  R ear normal and L ear normal.   Nose:     no external deformity.   Chest Wall:     left chest incision s/p defib placement no sign of infection Lungs:     normal respiratory effort, no intercostal retractions, no accessory muscle use, and normal breath sounds.   Heart:     normal rate.  holosystolic murmur Msk:     right lateral thigh with at least 15cm x 12cm fatty tissue, nontender (difficult to exactly measure given obesity)  Extremities:     trace left pedal edema and trace right pedal edema.   Neurologic:     alert & oriented X3.    Diabetes Management Exam:    Foot Exam (with socks and/or shoes not present):       Sensory-Pinprick/Light touch:          Left medial foot (L-4): normal          Left dorsal foot (L-5): normal          Left lateral foot (S-1): normal          Right medial foot (L-4): normal          Right dorsal foot (L-5): normal          Right lateral foot (S-1): normal       Sensory-Monofilament:          Left foot: normal          Right foot: normal       Sensory-other: dry skin between toes       Inspection:          Left foot: abnormal             Comments: dry skin on  heels          Right foot: abnormal             Comments: dry skin on heels       Nails:          Left foot: thickened          Right foot: thickened    Impression & Recommendations:  Problem # 1:  LIPOMA (ICD-214.9)  Right leg.  No enlargement since last visit but biopsy will confirm dx. Orders: Dermatology Referral (Derma)   Problem # 2:  DM (ICD-250.00) Blood sugar is still uncontrolled. will increase lantus to 20units at night.  Advised pt that she needs to monitor diet. Her updated medication list for this problem includes:    Glucophage 1000 Mg Tabs (Metformin hcl) .[name]... 1 tablet by mouth two times a day for blood sugar    Lantus 100 Unit/ml Soln (Insulin glargine) .[name]... 20 units at night for diabetes    Lisinopril 20 Mg Tabs (Lisinopril) .[name]... 1 tablet by mouth two times a day for blood pressure    Bayer Low Strength 81 Mg Tbec (Aspirin) .[name]... 1 tablet by mouth daily for circulation   Problem # 3:  CONGESTIVE HEART FAILURE, SYSTOLIC DYSFUNCTION (A999333) Cardiology notes reviewed in chart.  Maintain f/u as ordered. Her updated medication list for this problem includes:    Lisinopril 20 Mg Tabs (Lisinopril) .[name]... 1 tablet by mouth two times a day for blood pressure    Spironolactone 25 Mg Tabs (Spironolactone) .[name]... 1 tablet by mouth daily for fluid    Furosemide 40 Mg Tabs (Furosemide) .[name]... 1 tablet by mouth daily for fluid    Lopressor 100 Mg Tabs (Metoprolol tartrate) .[name]... 1 tablet  by mouth daily    Bayer Low Strength 81 Mg Tbec (Aspirin) .[name]... 1 tablet by mouth daily for circulation    Toprol Xl 100 Mg Tb24 (Metoprolol succinate) .[name]... 1 once daily   Problem # 4:  HYPERTENSION, BENIGN ESSENTIAL (ICD-401.1) Toprol recently increased by cardiology.  pt to continue this dose though bp is elevated today.  Will continue to monitor. Her updated medication list for this problem includes:    Lisinopril 20 Mg Tabs (Lisinopril) .[name]... 1 tablet by mouth two times a day for  blood pressure    Spironolactone 25 Mg Tabs (Spironolactone) .[name]... 1 tablet by mouth daily for fluid    Furosemide 40 Mg Tabs (Furosemide) .[name]... 1 tablet by mouth daily for fluid    Lopressor 100 Mg Tabs (Metoprolol tartrate) .[name]... 1 tablet by mouth daily    Toprol Xl 100 Mg Tb24 (Metoprolol succinate) .[name]... 1 once daily   Complete Medication List: 1)  Glucophage 1000 Mg Tabs (Metformin hcl) .[name].. 1 tablet by mouth two times a day for blood sugar 2)  Lantus 100 Unit/ml Soln (Insulin glargine) .... 20 units at night for diabetes 3)  Lisinopril 20 Mg Tabs (Lisinopril) .[name].. 1 tablet by mouth two times a day for blood pressure 4)  Simvastatin 40 Mg Tabs (Simvastatin) .[name].. 1 tablet by mouth at night for cholesterol 5)  Spironolactone 25 Mg Tabs (Spironolactone) .[name].. 1 tablet by mouth daily for fluid 6)  Furosemide 40 Mg Tabs (Furosemide) .[name].. 1 tablet by mouth daily for fluid 7)  Lopressor 100 Mg Tabs (Metoprolol tartrate) .[name].. 1 tablet by mouth daily 8)  Bayer Low Strength 81 Mg Tbec (Aspirin) .[name].. 1 tablet by mouth daily for circulation 9)  Naprosyn 500 Mg Tabs (Naproxen) .[name].. 1 tablet by mouth two times a day as needed for pain 10)  Toprol Xl 100 Mg Tb24 (Metoprolol succinate) .[name].. 1 once daily  Other Orders: Capillary Blood Glucose [id number]) Fingerstick [id number]) Flu Vaccine 60yrs + [id number]) Admin 1st Vaccine [id number])  Diabetes Management Assessment/Plan:      The following lipid goals have been established for the patient: Total cholesterol goal of 200; LDL cholesterol goal of 100; HDL cholesterol goal of 40; Triglyceride goal of 200.  Her blood pressure goal is < 130/80.     Patient Instructions: 1)  Follow up in 1 month and bring blood sugar log 2)  Increase lantus insulin to 20 units nightly    Prescriptions: LANTUS 100 UNIT/ML  SOLN (INSULIN GLARGINE) 20 units at night for diabetes  #1 x 3   Entered and Authorized by:   Aurora Mask FNP   Signed by:   Aurora Mask FNP on  08/23/2007   Method used:   Print then Give to Patient   RxID:   [id number]  ]  Influenza Vaccine    Vaccine Type: Fluvax 3+    Site: right deltoid    Mfr: [place]    Dose: 0.5 ml    Route: IM    Given by: brenda craddock    Exp. Date: 04/01/2008    Lot #: [id number]    VIS given: 04/26/07 version given August 23, 2007.  Flu Vaccine Consent Questions    Do you have a history of severe allergic reactions to this vaccine? no    Any prior history of allergic reactions to egg and/or gelatin? no    Do you have a sensitivity to the preservative Thimersol? no    Do you have a past history of  Guillan-Barre Syndrome? no    Do you currently have an acute febrile illness? no    Have you ever had a severe reaction to latex? no    Vaccine information given and explained to patient? yes    Are you currently pregnant? no

## 2010-11-04 NOTE — Assessment & Plan Note (Signed)
Summary: Diabetes/HTN   Vital Signs:  Patient profile:   60 year old female Menstrual status:  hysterectomy Weight:      245.3 pounds BMI:     48.08 BSA:     2.04 O2 Sat:      97 % on Room air Temp:     97.6 degrees F oral Pulse rate:   82 / minute Pulse rhythm:   regular Resp:     20 per minute BP sitting:   142 / 82  (left arm) Cuff size:   large  Vitals Entered By: Isla Pence (October 26, 2009 8:34 AM)  Nutrition Counseling: Patient's BMI is greater than 25 and therefore counseled on weight management options.  O2 Flow:  Room air CC: follow-up visit DM, Lipid Management, Hypertension Management, Depression, CHF Management Is Patient Diabetic? Yes Pain Assessment Patient in pain? yes     Location: legs CBG Result 255 CBG Device ID A  Does patient need assistance? Functional Status Self care Ambulation Normal   Primary Care Provider:  Aurora Mask FNP  CC:  follow-up visit DM, Lipid Management, Hypertension Management, Depression, and CHF Management.  History of Present Illness:  Pt into the office for follow up on diabetes. Pt presents today with all her medications.  However she did not take her medications today before this visit.  Tdap - unsure when last tetanus was but thinks it has been within the last 10 years.  Date of 2003 given during exam   CHF History:      Daily weights are not being checked.  She understands fluid management and sodium restriction but is not following this regimen.  The patient expresses understanding of the treatment plan and her medications.  She admits to being compliant with her medications.  ADL's are being done without symptoms or restrictions.  The patient has not been enrolled in the CHF Eduction Program.  She denies any side effects from her medications.    Depression History:      The patient denies a depressed mood most of the day and a diminished interest in her usual daily activities.        The patient denies  that she feels like life is not worth living, denies that she wishes that she were dead, and denies that she has thought about ending her life.         Depression Treatment History:  Prior Medication Used:   Start Date: Assessment of Effect:   Comments:  Celexa (citalopram)     --     much improvement     --  Diabetes Management History:      The patient is a 60 years old female who comes in for evaluation of Type 2 Diabetes Mellitus.  She has not been enrolled in the "Diabetic Education Program".  She states lack of understanding of dietary principles and is not following her diet appropriately.  No sensory loss is reported.  Self foot exams are not being performed.  She is checking home blood sugars.  She says that she is not exercising regularly.        Her home blood sugars include fasting blood sugars: highest: 173, lowest: 68.    Hypertension History:      She denies headache, chest pain, and palpitations.  She notes no problems with any antihypertensive medication side effects.        Positive major cardiovascular risk factors include female age 45 years old or older, diabetes, hyperlipidemia, hypertension,  family history for ischemic heart disease (females less than 37 years old & males less than 45 years old), and current tobacco user.        Positive history for target organ damage include cardiac end organ damage (either CHF or LVH).  Further assessment for target organ damage reveals no history of ASHD, stroke/TIA, peripheral vascular disease, renal insufficiency, or hypertensive retinopathy.    Lipid Management History:      Positive NCEP/ATP III risk factors include female age 73 years old or older, diabetes, family history for ischemic heart disease (females less than 68 years old & males less than 70 years old), current tobacco user, and hypertension.  Negative NCEP/ATP III risk factors include no history of early menopause without estrogen hormone replacement, HDL cholesterol  greater than 60, no ASHD (atherosclerotic heart disease), no prior stroke/TIA, no peripheral vascular disease, and no history of aortic aneurysm.        The patient states that she knows about the "Therapeutic Lifestyle Change" diet.  Her compliance with the TLC diet is poor.  The patient does not know about adjunctive measures for cholesterol lowering.  Adjunctive measures started by the patient include ASA.  She expresses no side effects from her lipid-lowering medication.  Comments include: pt is taking meds as ordered.  The patient denies any symptoms to suggest myopathy or liver disease.        Habits & Providers  Alcohol-Tobacco-Diet     Alcohol drinks/day: 0     Tobacco Status: current     Tobacco Counseling: to quit use of tobacco products     Cigarette Packs/Day: 2-3cig     Year Quit: 10/2007  Exercise-Depression-Behavior     Does Patient Exercise: no     Exercise Counseling: to improve exercise regimen     Type of exercise: walking     Exercise (avg: min/session): <30     Times/week: <3     Have you felt down or hopeless? no     Have you felt little pleasure in things? no     Depression Counseling: not indicated; screening negative for depression     Drug Use: no     Seat Belt Use: 100     Sun Exposure: occasionally  Allergies (verified): 1)  ! * Actos 2)  ! Hydrocortisone (Hydrocortisone)  Social History: Smoking Status:  current  Review of Systems General:  Denies fever. CV:  Denies chest pain or discomfort. Resp:  Complains of shortness of breath; improved s/p ER visit and prednisone.  [name] GI:  Denies abdominal pain, nausea, and vomiting. MS:  Complains of joint pain; ight intermittent leg pain - s/p lipoma removal several months ago.[name]  Physical Exam  General:  alert.  obese Head:  normocephalic.   Lungs:  normal breath sounds.   Heart:  normal rate and regular rhythm.   Abdomen:  soft, non-tender, and normal bowel sounds.   Neurologic:  gait normal.     Skin:  dry  Diabetes Management Exam:    Foot Exam (with socks and/or shoes not present):       Sensory-Monofilament:          Left foot: normal          Right foot: normal       Nails:          Left foot: thickened          Right foot: thickened   Impression & Recommendations:  Problem # 1:  DM (  ICD-250.00) still uncontrolled but pt is now checking blood sugar daily (log present today) advised her to continue efforts at dietary management It would be great if pt would increase her exercise but this has been a struggle Her updated medication list for this problem includes:    Lantus 100 Unit/ml Soln (Insulin glargine) .Marland KitchenMarland KitchenMarland KitchenMarland Kitchen 70 units at night for diabetes    Lisinopril 40 Mg Tabs (Lisinopril) .[name]... 1 tablet by mouth daily for blood pressure    Bayer Low Strength 81 Mg Tbec (Aspirin) .[name]... 1 tablet by mouth daily for circulation    Novolog Mix 70/30 Flexpen 70-30 % Susp (Insulin aspart prot & aspart) .Marland KitchenMarland KitchenMarland KitchenMarland Kitchen 15 units before breakfast and 15 units before dinner  Orders: Hemoglobin A1C (83036)  Problem # 2:  HYPERTENSION, BENIGN ESSENTIAL (ICD-401.1) Assessment: Unchanged DASH diet BP elevated today however pt has not taken any meds cardiology appt in February Her updated medication list for this problem includes:    Lisinopril 40 Mg Tabs (Lisinopril) .[name]... 1 tablet by mouth daily for blood pressure    Spironolactone 25 Mg Tabs (Spironolactone) .[name]... 1 tablet by mouth daily for fluid    Furosemide 40 Mg Tabs (Furosemide) .[name]... 1 tablet by mouth daily for fluid    Toprol Xl 200 Mg Xr24h-tab (Metoprolol succinate) ..... One tablet by mouth daily for blood pressure  Problem # 3:  CHRONIC OBSTRUCTIVE PULMONARY DISEASE, MODERATE (ICD-496) PFT results reviewed with pt. will given MDI The following medications were removed from the medication list:    Flovent Hfa 44 Mcg/act Aero (Fluticasone propionate  hfa) .[name]..[name] Two puffs two times a day Her updated medication list for this problem  includes:    Ventolin Hfa 108 (90 Base) Mcg/act Aers (Albuterol sulfate) .[name]... 2 puffs every 6 hours as needed for shortness of breath  Orders: Pulse Oximetry (single measurment) (94760)  Problem # 4:  FATIGUE (ICD-780.79) actually up 2 pounds since last visit advised pt that she really needs to monitor diet since her exercise is limited  Problem # 5:  TOBACCO ABUSE (ICD-305.1) 2-3 cigarettes per day but advised pt that even that amount can impact her health  Complete Medication List: 1)  Lantus 100 Unit/ml Soln (Insulin glargine) .... 70 units at night for diabetes 2)  Lisinopril 40 Mg Tabs (Lisinopril) .[name].. 1 tablet by mouth daily for blood pressure 3)  Spironolactone 25 Mg Tabs (Spironolactone) .[name].. 1 tablet by mouth daily for fluid 4)  Furosemide 40 Mg Tabs (Furosemide) .[name].. 1 tablet by mouth daily for fluid 5)  Bayer Low Strength 81 Mg Tbec (Aspirin) .[name].. 1 tablet by mouth daily for circulation 6)  Toprol Xl 200 Mg Xr24h-tab (Metoprolol succinate) .... One tablet by mouth daily for blood pressure 7)  Celexa 20 Mg Tabs (Citalopram hydrobromide) .[name].. 1 tablet by mouth daily for mood 8)  Triamcinolone Acetonide 0.1 % Oint (Triamcinolone acetonide) .[name].. 1 application topically two times a day to affected area 9)  Lipitor 20 Mg Tabs (Atorvastatin calcium) .... One tablet by mouth nightly for cholesterol 10)  Neurontin 300 Mg Caps (Gabapentin) .[name].. 1 tablet by mouth at night 11)  Novolog Mix 70/30 Flexpen 70-30 % Susp (Insulin aspart prot & aspart) .[name].. 15 units before breakfast and 15 units before dinner 12)  Pen Needles 31g X 6 Mm Misc (Insulin pen needle) .... To use with novolog insulin two times a day 13)  Ultram 50 Mg Tabs (Tramadol hcl) .[name].. 1-2 tablet by mouth two times a day as needed for  pain 14)  Ventolin Hfa 108 (90 Base) Mcg/act Aers (Albuterol sulfate) .... 2 puffs every 6 hours as needed for shortness of breath  Other Orders: Capillary Blood Glucose/CBG [id number])  CHF  Assessment/Plan:      The patient's current weight is 245.3 pounds.  Her previous weight was 243.6 pounds.    Diabetes Management Assessment/Plan:      The following lipid goals have been established for the patient: Total cholesterol goal of 200; LDL cholesterol goal of 100; HDL cholesterol goal of 40; Triglyceride goal of 150.  Her blood pressure goal is < 130/80.    Hypertension Assessment/Plan:      The patient's hypertensive risk group is category C: Target organ damage and/or diabetes.  Her calculated 10 year risk of coronary heart disease is 15 %.  Today's blood pressure is 142/82.  Her blood pressure goal is < 130/80.  Lipid Assessment/Plan:      Based on NCEP/ATP III, the patient's risk factor category is "history of diabetes".  The patient's lipid goals are as follows: Total cholesterol goal is 200; LDL cholesterol goal is 100; HDL cholesterol goal is 40; Triglyceride goal is 150.    Diabetic Foot Exam Foot Inspection Is there a history of a foot ulcer?              No Is there a foot ulcer now?              No Can the patient see the bottom of their feet?          No Are the shoes appropriate in style and fit?          Yes Is there swelling or an abnormal foot shape?          No Are the toenails long?                No Are the toenails thick?                Yes Are the toenails ingrown?              No Is there heavy callous build-up?              No Is there pain in the calf muscle (Intermittent claudication) when walking?    NoIs there a claw toe deformity?              No Is there elevated skin temperature?            No Is there limited ankle dorsiflexion?            No Is there foot or ankle muscle weakness?            No  Diabetic Foot Care Education Pulse Check          Right Foot          Left Foot Dorsalis Pedis:        normal            normal    10-g (5.07) Semmes-Weinstein Monofilament Test Performed by: Isla Pence          Right Foot          Left  Foot Visual Inspection                Patient Instructions: 1)  Diabetes - Your Hgba1c = 9.6 today.  It was 10 on last check. Remember the goal is less than 7. 2)  It  is really really important that you monitor your diet and take your insulin as the two work together.  Any increase in physical activity such as walking 5-10 minutes non-stop per day also helps 3)  Breathing test shows that you have some obstructive which comes from years of smoking and other lung problems.  Use the inhaler (sent to the pharmacy) as needed for shortness of breath.  Most importantly you have to quit smoking. 4)  Follow up in 3 months for diabetes. 5)  Will need foot check, Hgba1c, cbg. Prescriptions: VENTOLIN HFA 108 (90 BASE) MCG/ACT AERS (ALBUTEROL SULFATE) 2 puffs every 6 hours as needed for shortness of breath  #1 x 3   Entered and Authorized by:   Aurora Mask FNP   Signed by:   Aurora Mask FNP on 10/26/2009   Method used:   Faxed to ...       [facility] (retail)       [place], [place]  [id number]       Ph: [phone] [place]       Fax: [phone]   RxID:   [id number]    Last LDL:                                                 78 (07/29/2009 9:28:00 PM)        Diabetic Foot Exam Pulse Check          Right Foot          Left Foot Dorsalis Pedis:        normal            normal    10-g (5.07) Semmes-Weinstein Monofilament Test Performed by: Isla Pence          Right Foot          Left Foot Visual Inspection               Test Control      normal         normal Site 1         normal         normal Site 2         normal         normal Site 3         normal         normal Site 4         normal         normal Site 5         normal         normal Site 6         normal         normal Site 7         normal         normal Site 8         normal         normal Site 9         normal         normal Site 10         normal          normal  Impression      normal  normal   Laboratory Results   Blood Tests   Date/Time Received: October 26, 2009 8:47 AM  Date/Time Reported: October 26, 2009 8:47 AM   HGBA1C: 9.6%   (Normal Range: Non-Diabetic - 3-6%   Control Diabetic - 6-8%) CBG Random:: 255

## 2010-11-04 NOTE — Assessment & Plan Note (Signed)
Summary: Diabetes   Vital Signs:  Patient profile:   60 year old female Menstrual status:  hysterectomy Weight:      237.5 pounds Temp:     99.0 degrees F Pulse rate:   72 / minute Pulse rhythm:   regular Resp:     18 per minute BP sitting:   150 / 94  (left arm) Cuff size:   large  Vitals Entered By: Shellia Carwin CMA (March 12, 2009 10:37 AM) CC: diabetes f/u, pt had a uti and was vomitting she went to the ED for that and is feeling better., Hypertension Management Is Patient Diabetic? Yes  Pain Assessment Patient in pain? no      CBG Result 138  Does patient need assistance? Ambulation Normal Comments out of darvocet, naprosyn and triamcinolone cream  years   days  Menstrual Status hysterectomy   CC:  diabetes f/u, pt had a uti and was vomitting she went to the ED for that and is feeling better., and Hypertension Management.  History of Present Illness:  Pt into the office for routine f/u. she reports that she was in the ER for UTI and dehydration. She has completed her supply of antibiotics. -n/v -fever -abd pain -dysuria  Diabetes -  Obesity - down 5 pounds since last visit admits that she is drinking lots of juice she has started to walk but does very little in the way of exercise  Hypertension History:      She denies headache, chest pain, and palpitations.  pt does have her medications with her however she did not take them with her today.        Positive major cardiovascular risk factors include female age 57 years old or older, diabetes, hyperlipidemia, hypertension, family history for ischemic heart disease (females less than 64 years old & males less than 60 years old), and current tobacco user.        Positive history for target organ damage include cardiac end organ damage (either CHF or LVH).  Further assessment for target organ damage reveals no history of ASHD, stroke/TIA, peripheral vascular disease, renal insufficiency, or hypertensive  retinopathy.      Habits & Providers  Alcohol-Tobacco-Diet     Alcohol drinks/day: 0     Tobacco Status: current     Cigarette Packs/Day: 2-3cig     Year Quit: 10/2007  Exercise-Depression-Behavior     Does Patient Exercise: no     Drug Use: no     Seat Belt Use: 100     Sun Exposure: occasionally  Current Medications (verified): 1)  Lantus 100 Unit/ml  Soln (Insulin Glargine) .... 60 Units At Night For Diabetes 2)  Lisinopril 40 Mg  Tabs (Lisinopril) .[name].. 1 Tablet By Mouth Daily For Blood Pressure 3)  Spironolactone 25 Mg  Tabs (Spironolactone) .[name].. 1 Tablet By Mouth Daily For Fluid 4)  Furosemide 40 Mg  Tabs (Furosemide) .[name].. 1 Tablet By Mouth Daily For Fluid 5)  Bayer Low Strength 81 Mg  Tbec (Aspirin) .[name].. 1 Tablet By Mouth Daily For Circulation 6)  Naprosyn 500 Mg  Tabs (Naproxen) .[name].. 1 Tablet By Mouth Two Times A Day As Needed For Pain 7)  Toprol Xl 200 Mg Xr24h-Tab (Metoprolol Succinate) .... One Tablet By Mouth Daily For Blood Pressure 8)  Celexa 20 Mg  Tabs (Citalopram Hydrobromide) .[name].. 1 Tablet By Mouth Daily For Mood 9)  Triamcinolone Acetonide 0.1 %  Oint (Triamcinolone Acetonide) .[name].. 1 Application Topically Two Times A Day To  Affected Area 10)  Lipitor 20 Mg Tabs (Atorvastatin Calcium) .... One Tablet By Mouth Nightly For Cholesterol 11)  Darvocet-N 100 100-650 Mg Tabs (Propoxyphene N-Apap) 12)  Neurontin 300 Mg  Caps (Gabapentin) .[name].. 1 Tablet By Mouth At Night 13)  Novolog Mix 70/30 Flexpen 70-30 % Susp (Insulin Aspart Prot & Aspart) .[name].. 10 Units Before Breakfast and 10 Units Before Dinner 14)  Pen Needles 31g X 6 Mm Misc (Insulin Pen Needle) .... To Use With Novolog Insulin Two Times A Day 15)  Cortisporin 3.5-10000-1 Soln (Neomycin-Polymyxin-Hc) .... 3 Drops in Right Ear Three Times A Day For Up To 10 Days  Allergies (verified): 1)  ! * Actos  Social History: Smoking Status:  current  Review of Systems General:  Denies fever. CV:  Denies chest pain or  discomfort. Resp:  Complains of shortness of breath; denies cough. GI:  Denies abdominal pain, nausea, and vomiting. GU:  Denies dysuria.  Physical Exam  General:  alert.  obese Head:  normocephalic.   Lungs:  normal breath sounds.   Heart:  soft, S1S2  Abdomen:  obese  BS x 4 nontender Msk:  up to exam table Neurologic:  alert & oriented X3.    Diabetes Management Exam:    Foot Exam (with socks and/or shoes not present):       Sensory-Monofilament:          Left foot: normal          Right foot: normal   Impression & Recommendations:  Problem # 1:  DM (ICD-250.00) still uncontrolled will need to adjust insulin Her updated medication list for this problem includes:    Lantus 100 Unit/ml Soln (Insulin glargine) .Marland KitchenMarland KitchenMarland KitchenMarland Kitchen 70 units at night for diabetes    Lisinopril 40 Mg Tabs (Lisinopril) .[name]... 1 tablet by mouth daily for blood pressure    Bayer Low Strength 81 Mg Tbec (Aspirin) .[name]... 1 tablet by mouth daily for circulation    Novolog Mix 70/30 Flexpen 70-30 % Susp (Insulin aspart prot & aspart) .Marland KitchenMarland KitchenMarland KitchenMarland Kitchen 15 units before breakfast and 15 units before dinner  Orders: Hemoglobin A1C (83036)  Problem # 2:  HYPERTENSION, BENIGN ESSENTIAL (ICD-401.1) Assessment: Unchanged elevated today but pt has not taken her meds will have her f/u for bp check Her updated medication list for this problem includes:    Lisinopril 40 Mg Tabs (Lisinopril) .[name]... 1 tablet by mouth daily for blood pressure    Spironolactone 25 Mg Tabs (Spironolactone) .[name]... 1 tablet by mouth daily for fluid    Furosemide 40 Mg Tabs (Furosemide) .[name]... 1 tablet by mouth daily for fluid    Toprol Xl 200 Mg Xr24h-tab (Metoprolol succinate) ..... One tablet by mouth daily for blood pressure  Problem # 3:  HYPERLIPIDEMIA (ICD-272.4)  Her updated medication list for this problem includes:    Lipitor 20 Mg Tabs (Atorvastatin calcium) ..... One tablet by mouth nightly for cholesterol  Problem # 4:  OBESITY  (ICD-278.00) down 5 pounds since the last visit  Problem # 5:  CONGESTIVE HEART FAILURE, SYSTOLIC DYSFUNCTION (A999333) handicap placcard completed Her updated medication list for this problem includes:    Lisinopril 40 Mg Tabs (Lisinopril) .[name]... 1 tablet by mouth daily for blood pressure    Spironolactone 25 Mg Tabs (Spironolactone) .[name]... 1 tablet by mouth daily for fluid    Furosemide 40 Mg Tabs (Furosemide) .[name]... 1 tablet by mouth daily for fluid    Bayer Low Strength 81 Mg Tbec (Aspirin) .[name]... 1 tablet by mouth  daily for circulation    Toprol Xl 200 Mg Xr24h-tab (Metoprolol succinate) ..... One tablet by mouth daily for blood pressure  Complete Medication List: 1)  Lantus 100 Unit/ml Soln (Insulin glargine) .... 70 units at night for diabetes 2)  Lisinopril 40 Mg Tabs (Lisinopril) .[name].. 1 tablet by mouth daily for blood pressure 3)  Spironolactone 25 Mg Tabs (Spironolactone) .[name].. 1 tablet by mouth daily for fluid 4)  Furosemide 40 Mg Tabs (Furosemide) .[name].. 1 tablet by mouth daily for fluid 5)  Bayer Low Strength 81 Mg Tbec (Aspirin) .[name].. 1 tablet by mouth daily for circulation 6)  Naprosyn 500 Mg Tabs (Naproxen) .[name].. 1 tablet by mouth two times a day as needed for pain 7)  Toprol Xl 200 Mg Xr24h-tab (Metoprolol succinate) .... One tablet by mouth daily for blood pressure 8)  Celexa 20 Mg Tabs (Citalopram hydrobromide) .[name].. 1 tablet by mouth daily for mood 9)  Triamcinolone Acetonide 0.1 % Oint (Triamcinolone acetonide) .[name].. 1 application topically two times a day to affected area 10)  Lipitor 20 Mg Tabs (Atorvastatin calcium) .... One tablet by mouth nightly for cholesterol 11)  Neurontin 300 Mg Caps (Gabapentin) .[name].. 1 tablet by mouth at night 12)  Novolog Mix 70/30 Flexpen 70-30 % Susp (Insulin aspart prot & aspart) .[name].. 15 units before breakfast and 15 units before dinner 13)  Pen Needles 31g X 6 Mm Misc (Insulin pen needle) .... To use with novolog insulin two times a day  Other  Orders: Capillary Blood Glucose/CBG GU:[id number])  Hypertension Assessment/Plan:      The patient's hypertensive risk group is category C: Target organ damage and/or diabetes.  Her calculated 10 year risk of coronary heart disease is 20 %.  Today's blood pressure is 150/94.  Her blood pressure goal is < 130/80.  Patient Instructions: 1)  Follow up in 2 weeks for blood pressure check.  2)  Take your medications before this visit. 3)  Your blood sugar is still elevated 4)  you will need to increase your insulin. 5)  it is very important that you start walking/exercising in addition to diet changes to get blood sugar down 6)  Follow up in 6 weeks for diabetes Prescriptions: SPIRONOLACTONE 25 MG  TABS (SPIRONOLACTONE) 1 tablet by mouth daily for fluid  #30 x 5   Entered and Authorized by:   Aurora Mask FNP   Signed by:   Aurora Mask FNP on 03/12/2009   Method used:   Print then Give to Patient   RxIDUK:[id number] TRIAMCINOLONE ACETONIDE 0.1 %  OINT (TRIAMCINOLONE ACETONIDE) 1 application topically two times a day to affected area  #60gm x 0   Entered and Authorized by:   Aurora Mask FNP   Signed by:   Aurora Mask FNP on 03/12/2009   Method used:   Printed then faxed to ...       [facility] (retail)       [place],   [address]       Ph: [phone] [place]       Fax: [phone]   RxID:   [id number]    Last LDL:                                                 80 (  08/20/2008 11:06:00 PM)        Diabetic Foot Exam Foot Inspection Is there a history of a foot ulcer?              No Is there a foot ulcer now?              No Can the patient see the bottom of their feet?          No Are the shoes appropriate in style and fit?          Yes Is there swelling or an abnormal foot shape?          No Are the toenails long?                No Are the toenails thick?                Yes Are the toenails  ingrown?              No Is there heavy callous build-up?              No Is there a claw toe deformity?                          No Is there elevated skin temperature?            No Is there limited ankle dorsiflexion?            No Is there foot or ankle muscle weakness?            No Do you have pain in calf while walking?           No      Diabetic Foot Care Education :Patient educated on appropriate care of diabetic feet.  Pulse Check          Right Foot          Left Foot Dorsalis Pedis:        1+            1+    10-g (5.07) Semmes-Weinstein Monofilament Test Performed by: Shellia Carwin CMA          Right Foot          Left Foot Visual Inspection               Test Control      normal         normal Site 1         normal         normal Site 2         normal         normal Site 3         normal         normal Site 4         normal         normal Site 5         normal         normal Site 6         normal         normal Site 7         normal         normal Site 8         normal         normal Site 9         normal  normal Site 10         normal         normal  Impression      normal         normal  Laboratory Results   Blood Tests   Date/Time Recieved: March 12, 2009 11:38 AM   HGBA1C: 11.8%   (Normal Range: Non-Diabetic - 3-6%   Control Diabetic - 6-8%) CBG Random: 138

## 2010-11-04 NOTE — Assessment & Plan Note (Signed)
Summary: Diabetes/HTN/lipoma   Vital Signs:  Patient Profile:   60 Years Old Female Height:     60 inches Weight:      222.5 pounds Temp:     97.0 degrees F oral Pulse rate:   66 / minute Pulse rhythm:   regular Resp:     16 per minute BP sitting:   114 / 78  (left arm) Cuff size:   regular  Pt. in pain?   yes    Location:   shoulder    Intensity:   7    Type:       aching  Vitals Entered BySu Monks (October 25, 2007 2:00 PM)              Is Patient Diabetic? Yes  CBG Result 183 CBG Device ID A  Does patient need assistance? Functional Status Self care Ambulation Normal Comments pt. says she stays in the bed most of the time because she is in so much pain and feels depressed,  pt taking: Aldactone 25mg  tab-1 tab daily for fluid  Toprol XL 100mg  tab- take 1 tab daily Lipitor 20mg  tab- take 1 tab at bedtime for cholesterol     Chief Complaint:  FU on DM, shoulder pain, comes and goes but today it has been constant, and feels depressed.  History of Present Illness:  Pt into the office today for diabetes.  She has increased her lantus to 30 units as seen on last visit.  Pt is also taking metformin 1000mg  two times a day.  depression - pt notes that she does not have an appetite.  She likes to be alone.  She also admits that she has crying spells.  She does not rest well.  She notes that her mind is constantly thinking about different things.  Pt spends most of her day in the bed.  Only if she has to go out for the day does she get dressed.  Pt states that in the past 1 month she has wanted to be alone.  When her grandkids come over then she has some enjoyment.  She used to get on the computer and play games but she has not done that in 2 weeks.  lipoma - pt is to go for surgery eval on 11/05/07.  pt still has a few pain pills left over. Admits that is in the area where her lipoma is located.  CHF History:      Daily weights are not being checked.  She does not  understand fluid management and sodium restriction and is not following the regimen.  She denies any side effects from her medications.  Other comments include: pt is going to see the cardiologist next month.    Hypertension History:      She denies headache, chest pain, and palpitations.  She notes no problems with any antihypertensive medication side effects.  Further comments include: lisinopril increased to 40mg  two times a day.  she is also still taking the lisinopril.        Positive major cardiovascular risk factors include diabetes, hyperlipidemia, hypertension, family history for ischemic heart disease (females less than 72 years old & males less than 26 years old), and current tobacco user.  Negative major cardiovascular risk factors include female age less than 68 years old.        Positive history for target organ damage include cardiac end organ damage (either CHF or LVH).  Further assessment for target organ damage reveals no  history of ASHD, stroke/TIA, or peripheral vascular disease.    Lipid Management History:      Positive NCEP/ATP III risk factors include diabetes, family history for ischemic heart disease (females less than 13 years old & males less than 21 years old), current tobacco user, and hypertension.  Negative NCEP/ATP III risk factors include female age less than 60 years old, HDL cholesterol greater than 60, no ASHD (atherosclerotic heart disease), no prior stroke/TIA, no peripheral vascular disease, and no history of aortic aneurysm.       Current Allergies: No known allergies     Risk Factors:  HIV high-risk behavior:  no  Family History Risk Factors:    Family History of MI in females < 61 years old:  yes    Family History of MI in males < 12 years old:  yes   Review of Systems  General      Denies chills, fatigue, and fever.  ENT      Denies earache, nasal congestion, and sore throat.  CV      Denies chest pain or discomfort, fatigue, and  shortness of breath with exertion.  Resp      Denies cough and shortness of breath.  GI      Denies abdominal pain, constipation, nausea, and vomiting.  MS      Right leg pain  Neuro      Denies headaches.   Physical Exam  General:     alert.   Head:     normocephalic.  Eyes:     glasses Ears:     R ear normal and L ear normal.   Nose:     nose piercing noted.   Lungs:     normal respiratory effort, no intercostal retractions, and no accessory muscle use.   Heart:     soft S1S2  Abdomen:     soft, non-tender, and normal bowel sounds.   Msk:     up to exam table, no assist right outer thigh with large lipoma Neurologic:     alert & oriented X3.   Skin:     color normal.  color normal.   Psych:     Oriented X3.  Oriented X3.      Impression & Recommendations:  Problem # 1:  DM (ICD-250.00) Blood sugars are doing well.  continue current dose of meds. Keep blood sugar. Her updated medication list for this problem includes:    Glucophage 1000 Mg Tabs (Metformin hcl) .[name]... 1 tablet by mouth two times a day for blood sugar    Lantus 100 Unit/ml Soln (Insulin glargine) .Marland KitchenMarland KitchenMarland KitchenMarland Kitchen 30 units at night for diabetes    Lisinopril 40 Mg Tabs (Lisinopril) .[name]... 1 tablet by mouth two times a day for blood pressure    Bayer Low Strength 81 Mg Tbec (Aspirin) .[name]... 1 tablet by mouth daily for circulation  Orders: Capillary Blood Glucose ([id number]) Fingerstick [id number])   Problem # 2:  HYPERTENSION, BENIGN ESSENTIAL (ICD-401.1) Stable.  continue meds Her updated medication list for this problem includes:    Lisinopril 40 Mg Tabs (Lisinopril) .[name]... 1 tablet by mouth two times a day for blood pressure    Spironolactone 25 Mg Tabs (Spironolactone) .[name]... 1 tablet by mouth daily for fluid    Furosemide 40 Mg Tabs (Furosemide) .[name]... 1 tablet by mouth daily for fluid    Toprol Xl 100 Mg Tb24 (Metoprolol succinate) .[name]... 1 once daily   Problem # 3:  DEPRESSION (ICD-311) will start  celexa. pt encouraged to continue doing things that she enjoys.  she needs to get out of the house and enjoy her family. Her updated medication list for this problem includes:    Celexa 20 Mg Tabs (Citalopram hydrobromide) .[name]... 1 tablet by mouth daily for mood   Problem # 4:  CONGESTIVE HEART FAILURE, SYSTOLIC DYSFUNCTION (A999333) F/u with cardiology Her updated medication list for this problem includes:    Lisinopril 40 Mg Tabs (Lisinopril) .[name]... 1 tablet by mouth two times a day for blood pressure    Spironolactone 25 Mg Tabs (Spironolactone) .[name]... 1 tablet by mouth daily for fluid    Furosemide 40 Mg Tabs (Furosemide) .[name]... 1 tablet by mouth daily for fluid    Bayer Low Strength 81 Mg Tbec (Aspirin) .[name]... 1 tablet by mouth daily for circulation    Toprol Xl 100 Mg Tb24 (Metoprolol succinate) .[name]... 1 once daily   Complete Medication List: 1)  Glucophage 1000 Mg Tabs (Metformin hcl) .[name].. 1 tablet by mouth two times a day for blood sugar 2)  Lantus 100 Unit/ml Soln (Insulin glargine) .... 30 units at night for diabetes 3)  Lisinopril 40 Mg Tabs (Lisinopril) .[name].. 1 tablet by mouth two times a day for blood pressure 4)  Simvastatin 40 Mg Tabs (Simvastatin) .[name].. 1 tablet by mouth at night for cholesterol 5)  Spironolactone 25 Mg Tabs (Spironolactone) .[name].. 1 tablet by mouth daily for fluid 6)  Furosemide 40 Mg Tabs (Furosemide) .[name].. 1 tablet by mouth daily for fluid 7)  Bayer Low Strength 81 Mg Tbec (Aspirin) .[name].. 1 tablet by mouth daily for circulation 8)  Naprosyn 500 Mg Tabs (Naproxen) .[name].. 1 tablet by mouth two times a day as needed for pain 9)  Toprol Xl 100 Mg Tb24 (Metoprolol succinate) .[name].. 1 once daily 10)  Celexa 20 Mg Tabs (Citalopram hydrobromide) .[name].. 1 tablet by mouth daily for mood  CHF Assessment/Plan:      The patient's current weight is 222.5 pounds.  Her previous weight was 223 pounds.    Hypertension Assessment/Plan:      The patient's hypertensive risk group is  category C: Target organ damage and/or diabetes.  Her calculated 10 year risk of coronary heart disease is 6 %.  Today's blood pressure is 114/78.  Her blood pressure goal is < 130/80.  Lipid Assessment/Plan:      Based on NCEP/ATP III, the patient's risk factor category is "history of diabetes".  From this information, the patient's calculated lipid goals are as follows: Total cholesterol goal is 200; LDL cholesterol goal is 100; HDL cholesterol goal is 40; Triglyceride goal is 150.     Patient Instructions: 1)  Start new meds for mood.  It will take 4-6 weeks before you seen maximum effect.  Start to enjoy your family and things you like. 2)  Keep surgical appointment for Nov 05, 2007 3)  Follow up here in 2 months.  Come fasting at this appointment for labs.    Prescriptions: CELEXA 20 MG  TABS (CITALOPRAM HYDROBROMIDE) 1 tablet by mouth daily for mood  #30 x 3   Entered and Authorized by:   Aurora Mask FNP   Signed by:   Aurora Mask FNP on 10/25/2007   Method used:   Print then Give to Patient   [id number]  ]

## 2010-11-08 ENCOUNTER — Encounter: Payer: Self-pay | Admitting: Nurse Practitioner

## 2010-11-08 ENCOUNTER — Encounter (INDEPENDENT_AMBULATORY_CARE_PROVIDER_SITE_OTHER): Payer: Self-pay | Admitting: Nurse Practitioner

## 2010-11-08 LAB — CONVERTED CEMR LAB
ALT: 21 units/L (ref 0–35)
AST: 16 units/L (ref 0–37)
Albumin: 4.2 g/dL (ref 3.5–5.2)
Alkaline Phosphatase: 118 units/L — ABNORMAL HIGH (ref 39–117)
BUN: 11 mg/dL (ref 6–23)
Basophils Absolute: 0 10*3/uL (ref 0.0–0.1)
Basophils Relative: 1 % (ref 0–1)
Bilirubin Urine: NEGATIVE
Blood Glucose, Fingerstick: 187
Blood in Urine, dipstick: NEGATIVE
CO2: 25 meq/L (ref 19–32)
Calcium: 9.5 mg/dL (ref 8.4–10.5)
Chloride: 103 meq/L (ref 96–112)
Cholesterol: 181 mg/dL (ref 0–200)
Creatinine, Ser: 0.72 mg/dL (ref 0.40–1.20)
Eosinophils Absolute: 0.2 10*3/uL (ref 0.0–0.7)
Eosinophils Relative: 3 % (ref 0–5)
Glucose, Bld: 223 mg/dL — ABNORMAL HIGH (ref 70–99)
Glucose, Urine, Semiquant: NEGATIVE
HCT: 42.5 % (ref 36.0–46.0)
HDL: 65 mg/dL (ref >39)
Hemoglobin: 13.1 g/dL (ref 12.0–15.0)
Hgb A1c MFr Bld: 10 %
Ketones, urine, test strip: NEGATIVE
LDL Cholesterol: 93 mg/dL (ref 0–99)
Lymphocytes Relative: 32 % (ref 12–46)
Lymphs Abs: 2 10*3/uL (ref 0.7–4.0)
MCHC: 30.8 g/dL (ref 30.0–36.0)
MCV: 81.3 fL (ref 78.0–100.0)
Monocytes Absolute: 0.3 10*3/uL (ref 0.1–1.0)
Monocytes Relative: 5 % (ref 3–12)
Neutro Abs: 3.7 10*3/uL (ref 1.7–7.7)
Neutrophils Relative %: 59 % (ref 43–77)
Nitrite: NEGATIVE
Platelets: 240 10*3/uL (ref 150–400)
Potassium: 4.2 meq/L (ref 3.5–5.3)
Protein, U semiquant: 30
RBC: 5.23 M/uL — ABNORMAL HIGH (ref 3.87–5.11)
RDW: 17 % — ABNORMAL HIGH (ref 11.5–15.5)
Sodium: 142 meq/L (ref 135–145)
Specific Gravity, Urine: >=1.03
Total Bilirubin: 0.8 mg/dL (ref 0.3–1.2)
Total CHOL/HDL Ratio: 2.8
Total Protein: 6.9 g/dL (ref 6.0–8.3)
Triglycerides: 116 mg/dL (ref <150)
Urobilinogen, UA: 1
VLDL: 23 mg/dL (ref 0–40)
WBC Urine, dipstick: NEGATIVE
WBC: 6.3 10*3/uL (ref 4.0–10.5)
pH: 5.5

## 2010-11-09 ENCOUNTER — Encounter (INDEPENDENT_AMBULATORY_CARE_PROVIDER_SITE_OTHER): Payer: Self-pay | Admitting: Nurse Practitioner

## 2010-11-10 ENCOUNTER — Encounter (INDEPENDENT_AMBULATORY_CARE_PROVIDER_SITE_OTHER): Payer: Self-pay

## 2010-11-10 ENCOUNTER — Encounter: Payer: Self-pay | Admitting: Internal Medicine

## 2010-11-10 DIAGNOSIS — I428 Other cardiomyopathies: Secondary | ICD-10-CM

## 2010-11-18 NOTE — Letter (Signed)
Summary: Lipid Letter  [facility]  [address] [place], [place] [address]   Phone: [phone]  Fax: [phone]    11/09/2010  [facility] [place] [address] [place], [place]  [address]  Dear [name]:  We have carefully reviewed your last lipid profile from 11/08/2010 and the results are noted below with a summary of recommendations for lipid management.    Cholesterol:       181     Goal: less than 200   HDL "good" Cholesterol:   65     Goal: greater than 40   LDL "bad" Cholesterol:   93     Goal: less than 70   Triglycerides:       116     Goal: less than 150    Labs done during recent office visit shows that your cholesterol is doing good.  Also your blood sugar is still high.  Be sure to monitor your diet.  You should take your insulin as ordered.    Current Medications: 1)    Lantus 100 Unit/ml  Soln (Insulin glargine) .... 70 units at night for diabetes 2)    Lisinopril 40 Mg  Tabs (Lisinopril) .[name].. 1 tablet by mouth daily for blood pressure 3)    Spironolactone 25 Mg  Tabs (Spironolactone) .[name].. 1 tablet by mouth daily for fluid 4)    Furosemide 40 Mg  Tabs (Furosemide) .[name].. 1 tablet by mouth daily for fluid 5)    Bayer Low Strength 81 Mg  Tbec (Aspirin) .[name].. 1 tablet by mouth daily for circulation 6)    Toprol Xl 200 Mg Xr24h-tab (Metoprolol succinate) .... One tablet by mouth daily for blood pressure 7)    Celexa 20 Mg  Tabs (Citalopram hydrobromide) .[name].. 1 tablet by mouth daily for mood 8)    Triamcinolone Acetonide 0.1 %  Oint (Triamcinolone acetonide) .... As needed 9)    Lipitor 40 Mg Tabs (Atorvastatin calcium) .... One tablet by mouth nightly for cholesterol  **pharmacy - note change in dose** 10)    Neurontin 300 Mg  Caps (Gabapentin) .[name].. 1 tablet by mouth at night 11)    Novolog Mix 70/30 Flexpen 70-30 % Susp (Insulin aspart prot & aspart) .[name].. 15 units before breakfast and 15 units before dinner 12)    Pen Needles 31g X 6 Mm Misc  (Insulin pen needle) .... To use with novolog insulin two times a day 13)    Ultram 50 Mg Tabs (Tramadol hcl) .[name].. 1-2 tablet by mouth two times a day as needed for pain 14)    Ventolin Hfa 108 (90 Base) Mcg/act Aers (Albuterol sulfate) .... 2 puffs every 6 hours as needed for shortness of breath 15)    Ketoconazole 2 % Sham (Ketoconazole) .... Apply to scalp three times per week - let later and sit for 5 minutes then rinse 16)    Advair Diskus 250-50 Mcg/dose Aepb (Fluticasone-salmeterol) .... Two times a day If you have any questions, please call. We appreciate being able to work with you.   Sincerely,  [facility] Aurora Mask FNP

## 2010-11-18 NOTE — Assessment & Plan Note (Signed)
Summary: Diabetes/HTN   Vital Signs:  Patient profile:   60 year old female Menstrual status:  hysterectomy Weight:      246.56 pounds O2 Sat:      95 % on Room air Temp:     97.1 degrees F oral Pulse rate:   90 / minute Pulse rhythm:   regular Resp:     22 per minute BP sitting:   176 / 98  (left arm) Cuff size:   large  Vitals Entered By: Aquilla Solian CMA (November 08, 2010 8:55 AM)  O2 Flow:  Room air  Serial Vital Signs/Assessments:                                PEF    PreRx  PostRx Time      O2 Sat  O2 Type     L/min  L/min  L/min   By 9:05 AM   95  %   Room air                          Aquilla Solian CMA  Comments: 9:05 AM Peak Flow: 180 - 150 - 180 By: Aquilla Solian CMA   CC: f/u on her DM. Having lots of gas, Hypertension Management, Lipid Management, Depression Is Patient Diabetic? Yes Pain Assessment Patient in pain? no      CBG Result 187 CBG Device ID A fasting  Does patient need assistance? Functional Status Self care Ambulation Normal   Primary Care Provider:  Aurora Mask FNP  CC:  f/u on her DM. Having lots of gas, Hypertension Management, Lipid Management, and Depression.  History of Present Illness:  Pt into the office for f/u - diabetes. She presents with some of her medications but admits she is out of some and she has called the pharmacy for refills  Obesity - ongoing battle for pt. Down 1 pound since the last visit.  She still has not increased the exercise.  Pt has a cardiology appt this week.    Pt was advised to take her BP meds when she goes to that office since her BP is elevated today due to not taking meds (fasting because she was unsure if she needed labs)  Depression History:      The patient denies a depressed mood most of the day and a diminished interest in her usual daily activities.  The patient denies recurrent thoughts of death or suicide.        The patient denies that she feels like life is not worth living, denies that  she wishes that she were dead, and denies that she has thought about ending her life.         Depression Treatment History:  Prior Medication Used:   Start Date: Assessment of Effect:   Comments:  Celexa (citalopram)     --     much improvement     --  Diabetes Management History:      The patient is a 60 years old female who comes in for evaluation of Type 2 Diabetes Mellitus.  She has not been enrolled in the "Diabetic Education Program".  She states lack of understanding of dietary principles and is not following her diet appropriately.  No sensory loss is reported.  Self foot exams are not being performed.  She is checking home blood sugars.  She says that she is  not exercising regularly.        Hypoglycemic symptoms are not occurring.  No hyperglycemic symptoms are reported.  Other comments include: Pt checks her blood sugar daily and presents today with a log.        No changes have been made to her treatment plan since last visit.    Hypertension History:      She denies headache, chest pain, and palpitations.  Pt has not taken her BP meds today due to fasting status.        Positive major cardiovascular risk factors include female age 41 years old or older, diabetes, hyperlipidemia, hypertension, family history for ischemic heart disease (females less than 75 years old & males less than 59 years old), and current tobacco user.        Positive history for target organ damage include cardiac end organ damage (either CHF or LVH).  Further assessment for target organ damage reveals no history of ASHD, stroke/TIA, peripheral vascular disease, renal insufficiency, or hypertensive retinopathy.    Lipid Management History:      Positive NCEP/ATP III risk factors include female age 37 years old or older, diabetes, family history for ischemic heart disease (females less than 32 years old & males less than 37 years old), current tobacco user, and hypertension.  Negative NCEP/ATP III risk factors  include no history of early menopause without estrogen hormone replacement, HDL cholesterol greater than 60, no ASHD (atherosclerotic heart disease), no prior stroke/TIA, no peripheral vascular disease, and no history of aortic aneurysm.        The patient states that she knows about the "Therapeutic Lifestyle Change" diet.  Her compliance with the TLC diet is poor.  She expresses no side effects from her lipid-lowering medication.  Comments include: pt is taking meds as ordered.  The patient denies any symptoms to suggest myopathy or liver disease.        Habits & Providers  Alcohol-Tobacco-Diet     Alcohol drinks/day: 0     Alcohol Counseling: not indicated; use of alcohol is not excessive or problematic     Tobacco Status: current     Tobacco Counseling: to quit use of tobacco products     Cigarette Packs/Day: 2-3cig     Year Quit: 10/2007  Exercise-Depression-Behavior     Does Patient Exercise: no     Exercise Counseling: to improve exercise regimen     Type of exercise: walking     Exercise (avg: min/session): <30     Times/week: <3     Have you felt down or hopeless? no     Have you felt little pleasure in things? no     Depression Counseling: not indicated; screening negative for depression     Drug Use: no     Seat Belt Use: 100     Sun Exposure: occasionally  Current Medications (verified): 1)  Lantus 100 Unit/ml  Soln (Insulin Glargine) .... 70 Units At Night For Diabetes 2)  Lisinopril 40 Mg  Tabs (Lisinopril) .[name].. 1 Tablet By Mouth Daily For Blood Pressure 3)  Spironolactone 25 Mg  Tabs (Spironolactone) .[name].. 1 Tablet By Mouth Daily For Fluid 4)  Furosemide 40 Mg  Tabs (Furosemide) .[name].. 1 Tablet By Mouth Daily For Fluid 5)  Bayer Low Strength 81 Mg  Tbec (Aspirin) .[name].. 1 Tablet By Mouth Daily For Circulation 6)  Toprol Xl 200 Mg Xr24h-Tab (Metoprolol Succinate) .... One Tablet By Mouth Daily For Blood Pressure 7)  Celexa 20 Mg  Tabs (Citalopram Hydrobromide) .[name].. 1  Tablet By Mouth Daily For Mood 8)  Triamcinolone Acetonide 0.1 %  Oint (Triamcinolone Acetonide) .... As Needed 9)  Lipitor 40 Mg Tabs (Atorvastatin Calcium) .... One Tablet By Mouth Nightly For Cholesterol  **pharmacy - Note Change in Dose** 10)  Neurontin 300 Mg  Caps (Gabapentin) .[name].. 1 Tablet By Mouth At Night 11)  Novolog Mix 70/30 Flexpen 70-30 % Susp (Insulin Aspart Prot & Aspart) .[name].. 15 Units Before Breakfast and 15 Units Before Dinner 12)  Pen Needles 31g X 6 Mm Misc (Insulin Pen Needle) .... To Use With Novolog Insulin Two Times A Day 13)  Ultram 50 Mg Tabs (Tramadol Hcl) .[name].. 1-2 Tablet By Mouth Two Times A Day As Needed For Pain 14)  Ventolin Hfa 108 (90 Base) Mcg/act Aers (Albuterol Sulfate) .... 2 Puffs Every 6 Hours As Needed For Shortness of Breath 15)  Ketoconazole 2 % Sham (Ketoconazole) .... Apply To Scalp Three Times Per Week - Let Later and Sit For 5 Minutes Then Rinse 16)  Advair Diskus 250-50 Mcg/dose Aepb (Fluticasone-Salmeterol) .... Two Times A Day  Allergies (verified): 1)  ! * Actos 2)  ! Hydrocortisone (Hydrocortisone)  Review of Systems General:  Denies fever. CV:  Denies chest pain or discomfort. Resp:  Denies cough. GI:  Denies abdominal pain, nausea, and vomiting. MS:  Denies joint pain.  Physical Exam  General:  alert.   Head:  normocephalic.   Eyes:  glasses Lungs:  normal breath sounds.   Heart:  normal rate and regular rhythm.   Extremities:  trace left pedal edema and trace right pedal edema.   Neurologic:  alert & oriented X3.   Skin:  up to the exam table Psych:  Oriented X3.     Impression & Recommendations:  Problem # 1:  DM (ICD-250.00) HgbA1c = 10, still uncontrolled trying to be aggressive with pt's diet and eating habits.  pt is still not exercising so wieght is on ongoing battle Her updated medication list for this problem includes:    Lantus 100 Unit/ml Soln (Insulin glargine) .Marland KitchenMarland KitchenMarland KitchenMarland Kitchen 70 units at night for diabetes     Lisinopril 40 Mg Tabs (Lisinopril) .[name]... 1 tablet by mouth daily for blood pressure    Bayer Low Strength 81 Mg Tbec (Aspirin) .[name]... 1 tablet by mouth daily for circulation    Novolog Mix 70/30 Flexpen 70-30 % Susp (Insulin aspart prot & aspart) .Marland KitchenMarland KitchenMarland KitchenMarland Kitchen 15 units before breakfast and 15 units before dinner  Orders: Hgb A1C [id number]) UA Dipstick w/o Micro (manual) (81002)  Problem # 2:  HYPERTENSION, BENIGN ESSENTIAL (ICD-401.1) BP is elevated today - no meds yet today due to fasting status advised pt to take meds daily and to be sure to get meds from [place] Her updated medication list for this problem includes:    Lisinopril 40 Mg Tabs (Lisinopril) .[name]... 1 tablet by mouth daily for blood pressure    Spironolactone 25 Mg Tabs (Spironolactone) .[name]... 1 tablet by mouth daily for fluid    Furosemide 40 Mg Tabs (Furosemide) .[name]... 1 tablet by mouth daily for fluid    Toprol Xl 200 Mg Xr24h-tab (Metoprolol succinate) ..... One tablet by mouth daily for blood pressure  Problem # 3:  CHRONIC OBSTRUCTIVE PULMONARY DISEASE, MODERATE (ICD-496) tobacco abuse continues - 1-2 per day but advised pt to quit Her updated medication list for this problem includes:    Ventolin Hfa 108 (90 Base) Mcg/act Aers (Albuterol sulfate) .[name]... 2 puffs every 6  hours as needed for shortness of breath    Advair Diskus 250-50 Mcg/dose Aepb (Fluticasone-salmeterol) .[name]..[name] Two times a day  Orders: Peak Flow Rate (94150) Pulse Oximetry (single measurment) (94760)  Problem # 4:  HYPERLIPIDEMIA (ICD-272.4)  will check lipids today Her updated medication list for this problem includes:    Lipitor 40 Mg Tabs (Atorvastatin calcium) ..... One tablet by mouth nightly for cholesterol  **pharmacy - note change in dose**  Orders: T-Lipid Profile [id number]) T-Comprehensive Metabolic Panel (A999333) T-CBC w/Diff [id number])  Problem # 5:  TOBACCO ABUSE (ICD-305.1) advise cessation  Complete Medication List: 1)   Lantus 100 Unit/ml Soln (Insulin glargine) .... 70 units at night for diabetes 2)  Lisinopril 40 Mg Tabs (Lisinopril) .[name].. 1 tablet by mouth daily for blood pressure 3)  Spironolactone 25 Mg Tabs (Spironolactone) .[name].. 1 tablet by mouth daily for fluid 4)  Furosemide 40 Mg Tabs (Furosemide) .[name].. 1 tablet by mouth daily for fluid 5)  Bayer Low Strength 81 Mg Tbec (Aspirin) .[name].. 1 tablet by mouth daily for circulation 6)  Toprol Xl 200 Mg Xr24h-tab (Metoprolol succinate) .... One tablet by mouth daily for blood pressure 7)  Celexa 20 Mg Tabs (Citalopram hydrobromide) .[name].. 1 tablet by mouth daily for mood 8)  Triamcinolone Acetonide 0.1 % Oint (Triamcinolone acetonide) .... As needed 9)  Lipitor 40 Mg Tabs (Atorvastatin calcium) .... One tablet by mouth nightly for cholesterol  **pharmacy - note change in dose** 10)  Neurontin 300 Mg Caps (Gabapentin) .[name].. 1 tablet by mouth at night 11)  Novolog Mix 70/30 Flexpen 70-30 % Susp (Insulin aspart prot & aspart) .[name].. 15 units before breakfast and 15 units before dinner 12)  Pen Needles 31g X 6 Mm Misc (Insulin pen needle) .... To use with novolog insulin two times a day 13)  Ultram 50 Mg Tabs (Tramadol hcl) .[name].. 1-2 tablet by mouth two times a day as needed for pain 14)  Ventolin Hfa 108 (90 Base) Mcg/act Aers (Albuterol sulfate) .... 2 puffs every 6 hours as needed for shortness of breath 15)  Ketoconazole 2 % Sham (Ketoconazole) .... Apply to scalp three times per week - let later and sit for 5 minutes then rinse 16)  Advair Diskus 250-50 Mcg/dose Aepb (Fluticasone-salmeterol) .... Two times a day  Other Orders: Capillary Blood Glucose/CBG [id number])  Diabetes Management Assessment/Plan:      The following lipid goals have been established for the patient: Total cholesterol goal of 200; LDL cholesterol goal of 100; HDL cholesterol goal of 40; Triglyceride goal of 150.  Her blood pressure goal is < 130/80.    Hypertension Assessment/Plan:      The  patient's hypertensive risk group is category C: Target organ damage and/or diabetes.  Her calculated 10 year risk of coronary heart disease is 24 %.  Today's blood pressure is 176/98.  Her blood pressure goal is < 130/80.  Lipid Assessment/Plan:      Based on NCEP/ATP III, the patient's risk factor category is "history of diabetes".  The patient's lipid goals are as follows: Total cholesterol goal is 200; LDL cholesterol goal is 100; HDL cholesterol goal is 40; Triglyceride goal is 150.    Patient Instructions: 1)  Diabetes - Your Hgba1c = 10 2)  Your diabetes is still uncontrolled.  You really need to monitor your diet and adding some exercise such as walking would be even better.  Keep taking your insulin at current dose. 3)  Blood pressure - HIgh today but you  have not yet taking your medications.  Be sure to get the medications from the pharmacy.  Take the medications before your appointment with the heart doctor later this week. 4)  Cholesterol - Your labs will be checked today.  You will be notified of the results. 5)  Follow up in 3 months for diabetes.  will need cbg, hgba1c, u/a, foot screen.   Orders Added: 1)  Capillary Blood Glucose/CBG [82948] 2)  Hgb A1C [83036QW] 3)  Peak Flow Rate [94150] 4)  Pulse Oximetry (single measurment) [94760] 5)  Est. Patient Level IV [id number] 6)  UA Dipstick w/o Micro (manual) [81002] 7)  T-Lipid Profile [80061-22930] 8)  T-Comprehensive Metabolic Panel [id number] 9)  T-CBC w/Diff [id number]     Diabetic Foot Exam    10-g (5.07) Semmes-Weinstein Monofilament Test Performed by: Aquilla Solian CMA          Right Foot          Left Foot Visual Inspection     normal           normal   Laboratory Results   Urine Tests  Date/Time Received: November 08, 2010 9:17 AM   Routine Urinalysis   Color: yellow Glucose: negative   (Normal Range: Negative) Bilirubin: negative   (Normal Range: Negative) Ketone: negative   (Normal Range:  Negative) Spec. Gravity: >=1.030   (Normal Range: 1.003-1.035) Blood: negative   (Normal Range: Negative) pH: 5.5   (Normal Range: 5.0-8.0) Protein: 30   (Normal Range: Negative) Urobilinogen: 1.0   (Normal Range: 0-1) Nitrite: negative   (Normal Range: Negative) Leukocyte Esterace: negative   (Normal Range: Negative)     Blood Tests   Date/Time Received: November 08, 2010 9:11 AM   HGBA1C: 10.0%   (Normal Range: Non-Diabetic - 3-6%   Control Diabetic - 6-8%) CBG Random:: 187mg /dL     Laboratory Results   Urine Tests    Routine Urinalysis   Color: yellow Glucose: negative   (Normal Range: Negative) Bilirubin: negative   (Normal Range: Negative) Ketone: negative   (Normal Range: Negative) Spec. Gravity: >=1.030   (Normal Range: 1.003-1.035) Blood: negative   (Normal Range: Negative) pH: 5.5   (Normal Range: 5.0-8.0) Protein: 30   (Normal Range: Negative) Urobilinogen: 1.0   (Normal Range: 0-1) Nitrite: negative   (Normal Range: Negative) Leukocyte Esterace: negative   (Normal Range: Negative)     Blood Tests     HGBA1C: 10.0%   (Normal Range: Non-Diabetic - 3-6%   Control Diabetic - 6-8%) CBG Random:: 187      Appended Document: Diabetes Foot Screen    Nurse Visit   Allergies: 1)  ! * Actos 2)  ! Hydrocortisone (Hydrocortisone)    Diabetic Foot Exam    10-g (5.07) Semmes-Weinstein Monofilament Test Performed by: Aquilla Solian CMA          Right Foot          Left Foot Visual Inspection     normal         normal Test Control      normal         normal Site 1         normal         normal Site 2         normal         normal Site 3         normal         normal Site  4         normal         normal Site 5         normal         normal Site 6         normal         normal Site 7         normal         normal Site 8         normal         normal Site 9         normal         normal Site 10         normal         normal  Impression      normal          normal

## 2010-11-24 NOTE — Procedures (Signed)
Summary: device/saf mca   Current Medications (verified): 1)  Lantus 100 Unit/ml  Soln (Insulin Glargine) .... 70 Units At Night For Diabetes 2)  Lisinopril 40 Mg  Tabs (Lisinopril) .[name].. 1 Tablet By Mouth Daily For Blood Pressure 3)  Spironolactone 25 Mg  Tabs (Spironolactone) .[name].. 1 Tablet By Mouth Daily For Fluid 4)  Furosemide 40 Mg  Tabs (Furosemide) .[name].. 1 Tablet By Mouth Daily For Fluid 5)  Bayer Low Strength 81 Mg  Tbec (Aspirin) .[name].. 1 Tablet By Mouth Daily For Circulation 6)  Toprol Xl 200 Mg Xr24h-Tab (Metoprolol Succinate) .... One Tablet By Mouth Daily For Blood Pressure 7)  Celexa 20 Mg  Tabs (Citalopram Hydrobromide) .[name].. 1 Tablet By Mouth Daily For Mood 8)  Triamcinolone Acetonide 0.1 %  Oint (Triamcinolone Acetonide) .... As Needed 9)  Lipitor 40 Mg Tabs (Atorvastatin Calcium) .... One Tablet By Mouth Nightly For Cholesterol  **pharmacy - Note Change in Dose** 10)  Neurontin 300 Mg  Caps (Gabapentin) .[name].. 1 Tablet By Mouth At Night 11)  Novolog Mix 70/30 Flexpen 70-30 % Susp (Insulin Aspart Prot & Aspart) .[name].. 15 Units Before Breakfast and 15 Units Before Dinner 12)  Pen Needles 31g X 6 Mm Misc (Insulin Pen Needle) .... To Use With Novolog Insulin Two Times A Day 13)  Ultram 50 Mg Tabs (Tramadol Hcl) .[name].. 1-2 Tablet By Mouth Two Times A Day As Needed For Pain 14)  Ventolin Hfa 108 (90 Base) Mcg/act Aers (Albuterol Sulfate) .... 2 Puffs Every 6 Hours As Needed For Shortness of Breath 15)  Ketoconazole 2 % Sham (Ketoconazole) .... Apply To Scalp Three Times Per Week - Let Later and Sit For 5 Minutes Then Rinse 16)  Advair Diskus 250-50 Mcg/dose Aepb (Fluticasone-Salmeterol) .... Two Times A Day  Allergies (verified): 1)  ! * Actos 2)  ! Hydrocortisone (Hydrocortisone)   ICD Specifications Following MD:  Cristopher Peru, MD     ICD Vendor:  [employer]     ICD Model Number:  [id number]     ICD Serial Number:  [id number] ICD DOI:  05/04/2007     ICD Implanting MD:  Cristopher Peru, MD  Lead 1:     Location: RV     DOI: 05/04/2007     Model #: [id number]     Serial #: [id number]     Status: active  Indications::  NICM   ICD Follow Up Remote Check?  No Battery Voltage:  2.89 V     Charge Time:  11.1 seconds     Underlying rhythm:  SR ICD Dependent:  No       ICD Device Measurements Right Ventricle:  Amplitude: 12 mV, Impedance: 480 ohms, Threshold: 1.0 V at 0.5 msec  Episodes Coumadin:  No Shock:  0     ATP:  0     Nonsustained:  0     Ventricular Pacing:  0%  Brady Parameters Mode VVI     Lower Rate Limit:  40      Tachy Zones VF:  240     VT:  200     Next Cardiology Appt Due:  02/01/2011 Tech Comments:  No parameter changes. Device function normal. No Merlin. ROV 3 months clinic. Alma Friendly, LPN  February  8, [id number] 2:33 PM

## 2010-12-09 NOTE — Cardiovascular Report (Signed)
Summary: Office Visit   Office Visit   Imported By: Sallee Provencal 11/29/2010 15:53:08  _____________________________________________________________________  External Attachment:    Type:   Image     Comment:   External Document

## 2010-12-18 LAB — POCT CARDIAC MARKERS
CKMB, poc: 1 ng/mL (ref 1.0–8.0)
CKMB, poc: 2.6 ng/mL (ref 1.0–8.0)
Myoglobin, poc: 37.8 ng/mL (ref 12–200)
Myoglobin, poc: 66.7 ng/mL (ref 12–200)
Troponin i, poc: <0.05 ng/mL (ref 0.00–0.09)
Troponin i, poc: <0.05 ng/mL (ref 0.00–0.09)

## 2010-12-18 LAB — CBC
HCT: 39.3 % (ref 36.0–46.0)
Hemoglobin: 13 g/dL (ref 12.0–15.0)
MCHC: 33 g/dL (ref 30.0–36.0)
MCV: 82.4 fL (ref 78.0–100.0)
Platelets: 219 10*3/uL (ref 150–400)
RBC: 4.77 MIL/uL (ref 3.87–5.11)
RDW: 15 % (ref 11.5–15.5)
WBC: 9.1 10*3/uL (ref 4.0–10.5)

## 2010-12-18 LAB — POCT I-STAT, CHEM 8
BUN: 9 mg/dL (ref 6–23)
Calcium, Ion: 1.16 mmol/L (ref 1.12–1.32)
Chloride: 104 mEq/L (ref 96–112)
Creatinine, Ser: 0.7 mg/dL (ref 0.4–1.2)
Glucose, Bld: 219 mg/dL — ABNORMAL HIGH (ref 70–99)
HCT: 41 % (ref 36.0–46.0)
Hemoglobin: 13.9 g/dL (ref 12.0–15.0)
Potassium: 3.3 mEq/L — ABNORMAL LOW (ref 3.5–5.1)
Sodium: 142 mEq/L (ref 135–145)
TCO2: 28 mmol/L (ref 0–100)

## 2010-12-18 LAB — POCT I-STAT 3, ART BLOOD GAS (G3+)
Acid-Base Excess: 2 mmol/L (ref 0.0–2.0)
Bicarbonate: 26.4 mEq/L — ABNORMAL HIGH (ref 20.0–24.0)
O2 Saturation: 96 %
Patient temperature: 97.8
TCO2: 28 mmol/L (ref 0–100)
pCO2 arterial: 39.9 mmHg (ref 35.0–45.0)
pH, Arterial: 7.427 — ABNORMAL HIGH (ref 7.350–7.400)
pO2, Arterial: 75 mmHg — ABNORMAL LOW (ref 80.0–100.0)

## 2010-12-18 LAB — DIFFERENTIAL
Basophils Absolute: 0 10*3/uL (ref 0.0–0.1)
Basophils Relative: 0 % (ref 0–1)
Eosinophils Absolute: 0.3 10*3/uL (ref 0.0–0.7)
Eosinophils Relative: 3 % (ref 0–5)
Lymphocytes Relative: 36 % (ref 12–46)
Lymphs Abs: 3.2 10*3/uL (ref 0.7–4.0)
Monocytes Absolute: 0.6 10*3/uL (ref 0.1–1.0)
Monocytes Relative: 7 % (ref 3–12)
Neutro Abs: 4.9 10*3/uL (ref 1.7–7.7)
Neutrophils Relative %: 54 % (ref 43–77)

## 2010-12-18 LAB — BRAIN NATRIURETIC PEPTIDE: Pro B Natriuretic peptide (BNP): <30 pg/mL (ref 0.0–100.0)

## 2010-12-18 LAB — D-DIMER, QUANTITATIVE: D-Dimer, Quant: <0.22 ug/mL-FEU (ref 0.00–0.48)

## 2011-01-11 LAB — URINE MICROSCOPIC-ADD ON

## 2011-01-11 LAB — COMPREHENSIVE METABOLIC PANEL
ALT: 24 U/L (ref 0–35)
AST: 19 U/L (ref 0–37)
Albumin: 3.9 g/dL (ref 3.5–5.2)
Alkaline Phosphatase: 123 U/L — ABNORMAL HIGH (ref 39–117)
BUN: 8 mg/dL (ref 6–23)
CO2: 27 mEq/L (ref 19–32)
Calcium: 9.5 mg/dL (ref 8.4–10.5)
Chloride: 103 mEq/L (ref 96–112)
Creatinine, Ser: 0.77 mg/dL (ref 0.4–1.2)
GFR calc Af Amer: >60 mL/min (ref >60)
GFR calc non Af Amer: >60 mL/min (ref >60)
Glucose, Bld: 296 mg/dL — ABNORMAL HIGH (ref 70–99)
Potassium: 3.5 mEq/L (ref 3.5–5.1)
Sodium: 140 mEq/L (ref 135–145)
Total Bilirubin: 1 mg/dL (ref 0.3–1.2)
Total Protein: 7.3 g/dL (ref 6.0–8.3)

## 2011-01-11 LAB — CBC
HCT: 40.8 % (ref 36.0–46.0)
Hemoglobin: 13.4 g/dL (ref 12.0–15.0)
MCHC: 32.9 g/dL (ref 30.0–36.0)
MCV: 78.7 fL (ref 78.0–100.0)
Platelets: 234 10*3/uL (ref 150–400)
RBC: 5.18 MIL/uL — ABNORMAL HIGH (ref 3.87–5.11)
RDW: 15.9 % — ABNORMAL HIGH (ref 11.5–15.5)
WBC: 17 10*3/uL — ABNORMAL HIGH (ref 4.0–10.5)

## 2011-01-11 LAB — POCT CARDIAC MARKERS
CKMB, poc: <1 ng/mL — ABNORMAL LOW (ref 1.0–8.0)
CKMB, poc: <1 ng/mL — ABNORMAL LOW (ref 1.0–8.0)
Myoglobin, poc: 82.1 ng/mL (ref 12–200)
Myoglobin, poc: 87.2 ng/mL (ref 12–200)
Troponin i, poc: <0.05 ng/mL (ref 0.00–0.09)
Troponin i, poc: <0.05 ng/mL (ref 0.00–0.09)

## 2011-01-11 LAB — URINE CULTURE: Colony Count: >=100000

## 2011-01-11 LAB — URINALYSIS, ROUTINE W REFLEX MICROSCOPIC
Bilirubin Urine: NEGATIVE
Glucose, UA: >1000 mg/dL — AB
Ketones, ur: 40 mg/dL — AB
Nitrite: POSITIVE — AB
Protein, ur: 30 mg/dL — AB
Specific Gravity, Urine: 1.026 (ref 1.005–1.030)
Urobilinogen, UA: 2 mg/dL — ABNORMAL HIGH (ref 0.0–1.0)
pH: 6 (ref 5.0–8.0)

## 2011-01-11 LAB — LIPASE, BLOOD: Lipase: 24 U/L (ref 11–59)

## 2011-01-11 LAB — GLUCOSE, CAPILLARY
Glucose-Capillary: 274 mg/dL — ABNORMAL HIGH (ref 70–99)
Glucose-Capillary: 298 mg/dL — ABNORMAL HIGH (ref 70–99)

## 2011-02-08 ENCOUNTER — Ambulatory Visit (HOSPITAL_COMMUNITY)
Admission: RE | Admit: 2011-02-08 | Discharge: 2011-02-08 | Disposition: A | Discharge status: Home / Self Care | Payer: Self-pay | Source: Ambulatory Visit | Attending: Family Medicine | Admitting: Family Medicine

## 2011-02-08 ENCOUNTER — Other Ambulatory Visit (HOSPITAL_COMMUNITY): Payer: Self-pay | Admitting: Family Medicine

## 2011-02-08 DIAGNOSIS — R52 Pain, unspecified: Secondary | ICD-10-CM

## 2011-02-08 DIAGNOSIS — M25519 Pain in unspecified shoulder: Secondary | ICD-10-CM | POA: Insufficient documentation

## 2011-02-15 NOTE — Assessment & Plan Note (Signed)
[facility]                         ELECTROPHYSIOLOGY OFFICE NOTE   AJAYA, EVERAGE                         MRN:          [id number]  DATE:08/21/2007                            DOB:          [birth year]    SUBJECTIVE:  [name] returns today for followup.  She is a very  pleasant middle-aged woman, with a non-ischemic cardiomyopathy,  diabetes, hypertension, dyslipidemia, who returns today for followup.  She is status post implantable cardioverter defibrillator insertion.  She has not had any inter-current IC therapies.  She denies chest pain  or shortness of breath.  Overall she feels well.   MEDICATIONS:  1. Aspirin.  2. Lantus insulin.  3. Aldactone 25 mg daily.  4. Glucophage.  5. Lasix 40 mg daily.  6. Lisinopril 20 mg twice daily.  7. Simvastatin daily.  8. Toprol XL 100 mg daily.   PHYSICAL EXAMINATION:  GENERAL:  She is a pleasant well-appearing obese  woman, in no distress.  VITAL SIGNS:  Blood pressure 190/100, pulse 75 and regular, respirations  18, weight 233 pounds.  NECK:  No jugular venous distention.  LUNGS:  Clear bilaterally to auscultation.  No wheezes, rales or rhonchi  are present.  CARDIOVASCULAR:  A regular rate and rhythm.  Normal S1 and S2.  There is  a soft S4 gallop.  EXTREMITIES:  No edema.   Interrogation of her defibrillator demonstrates a Training and development officer V-168.  R-waves are greater than 12, impedance 540, threshold 0.75 at 0.5.  Battery voltage is 3.2 volts.   IMPRESSION:  1. Non-ischemic cardiomyopathy.  2. Congestive heart failure, class 1 to 2.  3. Obesity.  4. Hypertension.   DISCUSSION:  [name] is stable.  Her blood pressure is elevated today,  but she notes that she did not take her medications this morning.   FOLLOWUP:  I will plan to see her back in the office in one year.  She  will follow up in our [facility] in three months.     Champ Mungo. Lovena Le, MD  Electronically Signed    GWT/MedQ  DD: 08/21/2007  DT: 08/21/2007  Job #: [id number]   cc:   Jill Alexanders, M.D.

## 2011-02-15 NOTE — Assessment & Plan Note (Signed)
[facility]                            CARDIOLOGY OFFICE NOTE   RAENA, ORTLIP                         MRN:          [id number]  DATE:02/22/2008                            DOB:          Feb 23, 1951    Ms. Huguley is a pleasant 60 year old female with a history of nonischemic  cardiomyopathy, hypertension, hyperlipidemia, and diabetes mellitus.  She also has had a previous ICD placed secondary to reduced LV function.  Since I last saw her, she does have some dyspnea on exertion, but there  is no orthopnea, PND, palpitations, presyncope, or syncope.  She  occasionally has minimal pedal edema by her report.  She does state that  she has had some pain in the substernal area.  It is a sharp pain, and  it has been present continuously for several months.  It does not change  with position, nor is it related to food.  It is not exertional.  It  does not radiate.  She thinks it is probably from her previous placement  of defibrillator.  She has discontinued her tobacco use.   MEDICATIONS:  1. Lipitor 20 mg p.o. daily.  2. Neurontin 300 mg p.o. daily.  3. Lisinopril 40 mg p.o. b.i.d.  4. Aspirin 81 mg p.o. daily.  5. Insulin.  6. Aldactone 25 mg p.o. daily.  7. Glucophage 1000 mg p.o. b.i.d.  8. Lasix 40 mg p.o. daily.  9. Zocor 40 mg p.o. daily.  10.Toprol 100 mg p.o. daily.  11.Celexa 20 mg p.o. daily.   PHYSICAL EXAMINATION:  VITAL SIGNS:  Blood pressure of 145/88, and her  pulse is 77.  She weighs 236 pounds.  HEENT:  Normal.  NECK:  Supple with no bruits.  CHEST:  Clear.  CARDIOVASCULAR:  Regular rate.  ABDOMEN:  No tenderness.  EXTREMITIES:  No edema.   Electrocardiogram shows sinus rhythm at a rate of 78.  There is a  lateral T-wave inversion which is unchanged from previous.  QT is mildly  prolonged.   DIAGNOSES:  1. Nonischemic cardiomyopathy.  Mrs. Fey appears to be doing      reasonable well with her present medications.  I have  asked her to      decrease her lisinopril to 40 mg p.o. daily and increase her Toprol      to 150 mg p.o. daily.  She will continue with her present doses of      diuretics.  2. Hypertension.  Her blood pressure is mildly elevated.  However, she      has not taken her medications today.  She will track this.  We will      increase as needed.  3. Diabetes mellitus.  Managed per primary care.  4. Hyperlipidemia.  She will continue on her statin.  We will have her      most recent lipids and liver forwarded to [place] for our records.  I      will also have her most recent BMET forwarded to [place] for our      records.  5. Chest pain.  This does not sound cardiac as it has been present      continuously for several months and does not appear to be      significantly bothersome.  6. History of subclinical hyperthyroidism.  Management per primary      care.  7. Tobacco abuse.  She has discontinued this.   We will see her back in 9 months.     Denice Bors Stanford Breed, MD, [facility]  Electronically Signed    BSC/MedQ  DD: 02/22/2008  DT: 02/22/2008  Job #: [id number]   cc:   Myriam Jacobson

## 2011-02-15 NOTE — Op Note (Signed)
[name], [name]                  ACCOUNT NO.:  [id number]   MEDICAL RECORD NO.:  [id number]          PATIENT TYPE:  INP   LOCATION:  2899                         FACILITY:  [facility]   PHYSICIAN:  Champ Mungo. Lovena Le, MD    DATE OF BIRTH:  05-Mar-1951   DATE OF PROCEDURE:  05/04/2007  DATE OF DISCHARGE:                               OPERATIVE REPORT   PROCEDURE PERFORMED:  Implantation of a single chamber ICD.   INTRODUCTION:  The patient is a very pleasant, 60 year old woman with a  nonischemic cardiomyopathy and congestive heart failure (class II long  standing) with an EF of 33%, who is now referred for ICD implantation  (SCD-HeFT).   PROCEDURE:  After informed consent was obtained, the patient was taken  to the diagnostic EP lab in the fasting state.  After the usual  preparation and draping, intravenous fentanyl and midazolam was given  for sedation.  Thirty mL of lidocaine was infiltrated in the left  infraclavicular region.  A 7-cm incision was carried out over this  region.  Electrocautery was utilized to dissect down to the fascial  plane.  The left subclavian vein was punctured, and the St. Jude Durata  model 7120 active fixation defibrillation lead was advanced into the  right ventricle.  Mapping was carried out in the right ventricle along  the RV septum and at the final site which ended up on the RV inferior  apical septum.  The R-waves were 16 mV.  With the lead actively fixed,  the pacing impedance was 950 ohms and threshold 0.6 volts at 0.5  milliseconds.  Ten-volt pacing did not stimulate the diaphragm.  With  these satisfactory parameters, lead was secured to the subpectoralis  fascia with a figure-of-eight silk suture, and the sewing sleeve was  also secured with a silk suture.  Electrocautery was then utilized to  make a subcutaneous pocket.  Kanamycin irrigation was utilized to  irrigate the pocket, and electrocautery was utilized to assure  hemostasis.  The [place] V-168 single-chamber  defibrillator serial number [id number] was connected to the defibrillation  lead and placed back in the subcutaneous pocket.  Generator was secured  with silk suture.  Kanamycin irrigation was utilized to irrigate the  pocket and defibrillation threshold testing carried out.   After the patient was deeply sedated with fentanyl and Versed, VF was  induced with a T-wave shock, and a 15 joules shock was delivered which  terminated VF and restored sinus rhythm.  After 5 minutes was allowed to  elapse, a second defibrillation threshold test was carried out, and  again VF was induced with the DC fib shock, and a 15 joules shock was  delivered which terminated VF and restored sinus rhythm.  At this point,  the incision was closed with a layer of 2-0 Vicryl followed by a layer  of 3-0 Vicryl.  Benzoin was painted on the skin, Steri-Strips were  applied and a pressure dressing was placed, and the patient was returned  to her room in satisfactory condition.   COMPLICATIONS:  There were no immediate procedure complications.   RESULTS:  This demonstrates successful implantation of a St. Jude single-  chamber defibrillator in a patient with nonischemic cardiomyopathy,  congestive heart failure and EF of 33%.      Champ Mungo. Lovena Le, MD  Electronically Signed     GWT/MEDQ  D:  05/04/2007  T:  05/04/2007  Job:  [id number]   cc:   Denice Bors. Stanford Breed, MD, [facility]  Jill Alexanders, M.D.

## 2011-02-15 NOTE — Assessment & Plan Note (Signed)
[facility]                            CARDIOLOGY OFFICE NOTE   LAKAN, EASTMAN                         MRN:          [id number]  DATE:11/13/2008                            DOB:          March 16, 1951    [name] is a pleasant female who has a history of nonischemic  cardiomyopathy, hypertension, hyperlipidemia, and diabetes.  She also  has had a previous ICD placed.  I last saw her on Feb 22, 2008.  Since  that time, she does have some dyspnea on exertion but there is no  orthopnea, PND, or pedal edema.  She occasionally feels a pain in her  chest.  This is in the substernal area.  It can occur with exertion, but  it can also occasionally occur at rest.  It does not radiate.  She has  had this pain intermittently for years and is unchanged.  Note, she did  have a catheterization on September 18, 2002, that showed normal coronary  artery disease.  Her pain has not changed in frequency and it resolves  by itself.  She is continuing to smoke.   MEDICATIONS:  1. Neurontin 300 mg p.o. nightly.  2. Lisinopril 40 mg p.o. daily.  3. Toprol 150 mg p.o. daily.  4. Celexa 20 mg p.o. daily.  5. Crestor 5 mg p.o. daily.  6. Aldactone 25 mg p.o. daily.  7. Aspirin 81 mg p.o. daily.  8. Lasix 40 mg p.o. daily.  9. Insulin.   PHYSICAL EXAMINATION:  VITAL SIGNS:  Today, shows her blood pressure is  elevated.  She is 175/106.  Her pulse is 68.  She weighs 247 pounds.  HEENT:  Normal.  NECK:  Supple.  CHEST:  Clear.  CARDIOVASCULAR:  Regular rhythm.  ABDOMINAL:  No tenderness.  EXTREMITIES:  No edema.   Her electrocardiogram shows a sinus rhythm at a rate of 68.  The QT is  prolonged.  There are diffuse T-wave abnormalities.   DIAGNOSES:  1. Chest pain - the patient's symptoms are atypical and had been long-      standing.  She had a catheterization in 2003 that showed no      coronary artery disease.  She also had a Myoview performed on March 06, 2007.   She had no clear evidence of ischemia at that time.  We      will not pursue this further.  2. Nonischemic cardiomyopathy - she will continue with her beta-      blocker, angiotensin-converting enzyme inhibitor, spironolactone,      and diuretic.  3. Hypertension - her blood pressure is elevated.  I have increased      her Toprol to 200 mg p.o. daily.  I have asked her to track at      home.  We will have her return in 6 weeks and we will adjust her      regimen based on those results.  4. Diabetes mellitus - management per primary care.  5. Hyperlipidemia - she will continue on her statin.  This  is being      managed by primary care.  6. History of subclinical hyperthyroidism - management per primary      care.  7. Tobacco abuse - we discussed the importance of discontinuing this.   NOTE:  She did have a BMET drawn back in November and her potassium was  5.  We will see her back in 6 weeks as described above.     Peggy Bors Stanford Breed, MD, [facility]  Electronically Signed    BSC/MedQ  DD: 11/13/2008  DT: 11/14/2008  Job #: [id number]   cc:   [name]

## 2011-02-15 NOTE — Procedures (Signed)
[name]                  ACCOUNT NO.:  [id number]   MEDICAL RECORD NO.:  [id number]          PATIENT TYPE:  OUT   LOCATION:  SLEEP CENTER                 FACILITY:  [facility]   PHYSICIAN:  Clinton D. Annamaria Boots, MD, FCCP, FACPDATE OF BIRTH:  1951-09-20   DATE OF STUDY:  10/05/2008                            NOCTURNAL POLYSOMNOGRAM   REFERRING PHYSICIAN:  Aurora Mask   REFERRING PHYSICIAN:  Aurora Mask, NP   INDICATION FOR STUDY:  Hypersomnia with sleep apnea.   EPWORTH SLEEPINESS SCORE:  Epworth sleepiness score 11/24.  BMI 51.  Weight 236 pounds.  Height 59 inches.  Neck 16.5 inches.   MEDICATIONS:  Home medications are charted and reviewed.   SLEEP ARCHITECTURE:  Total sleep time 321 minutes with sleep efficiency  77.2%.  Stage I was 4.2%.  Stage II 90.5%.  Stage III absent.  REM 5.3%  of total sleep time.  Sleep latency was 91.5 minutes.  REM latency 159  minutes.  Awake after sleep onset 3.5 minutes.  Arousal index 17.9.  Bedtime medication:  Glucophage and Lantus.   RESPIRATORY DATA:  Apnea-hypopnea index (AHI) 11.2 per hour.  A total of  60 events were scored including 25 obstructive apneas, 1 central apnea,  and 34 hypopneas.  Events were not positional, but more common while  supine as expected.  REM AHI 52.9 per hour.  Technician reported  insufficient respiratory events to trigger CPAP titration by split  protocol on the study night.  Review of the graphs indicates most events  began after 2 a.m.   OXYGEN DATA:  Loud snoring with oxygen desaturation to a nadir of 82%.  Mean oxygen saturation through the study night was 94.1% on room air.   CARDIAC DATA:  Rhythm is regular and appears to be sinus, although the  patient is known to have a pacemaker defibrillator.  Correct assessment  would require multiple leads.   MOVEMENT-PARASOMNIA:  Occasional limb jerk, a total of 13 being counted,  of which 4 were associated with arousal or awakening for an  insignificant periodic limb movement with arousal index of 0.7 per hour.   IMPRESSIONS-RECOMMENDATIONS:  1. Mild obstructive sleep apnea/hypopnea syndrome, AHI 11.2 per hour      with nonpositional events, somewhat more common while supine, loud      snoring with oxygen desaturation to a nadir of 82% on room air.  2. There were insufficient early events to permit CPAP titration by      split protocol on the study night.  Consider return for CPAP      titration or evaluate for alternative therapies as clinically      appropriate.      Clinton D. Annamaria Boots, MD, [facility], FACP  Diplomate, Tax adviser of Sleep Medicine  Electronically Signed     CDY/MEDQ  D:  10/11/2008 09:49:42  T:  10/11/2008 23:31:41  Job:  [id number]

## 2011-02-15 NOTE — Assessment & Plan Note (Signed)
[facility]                            CARDIOLOGY OFFICE NOTE   [name], [name]                         MRN:          [id number]  DATE:08/09/2007                            DOB:          [birth year]    [name] is a 60 year old female with a history of nonischemic  cardiomyopathy, hypertension, hyperlipidemia, diabetes.  She is also  status post ICD secondary to reduced LV function.  Of note, when I last  saw her in May we scheduled her to have a nuclear study due to  electrocardiographic changes.  Her ejection fraction was noted to be  33%.  There was felt to be soft tissue attenuation but no ischemia.  Note, because of her ejection fraction of 33% she was referred for her  defibrillator, which was placed on May 04, 2007.  Since I last saw her  she has minimal dyspnea on exertion.  There is no orthopnea, PND, pedal  edema, palpitations, presyncope, syncope or exertional chest pain.  She  has some residual soreness around her ICD site.   MEDICATIONS:  1. Aspirin 81 mg p.o. daily.  2. Insulin.  3. Aldactone 25 mg p.o. daily.  4. Glucophage 1 g p.o. b.i.d.  5. Lasix 40 mg p.o. daily.  6. Lisinopril 20 mg p.o. b.i.d.  7. Zocor 40 mg p.o. daily.  8. Metoprolol ER 75 mg p.o. daily.  9. Bactrim.  10.Bacitracin ointment.   Physical exam today shows a blood pressure of 147/93 and the pulse is  82.  She weighs 227 pounds.  HEENT:  Normal.  NECK:  Supple with no bruits.  CHEST:  Clear.  CARDIOVASCULAR:  A regular rate and rhythm.  Her ICD site shows no  evidence of hematoma.  ABDOMEN:  No tenderness.  EXTREMITIES:  Trace edema.   Electrocardiogram shows a sinus rhythm at a rate of 82.  There are  nonspecific inferolateral T-wave changes.   DIAGNOSES:  1. Nonischemic cardiomyopathy.  We will continue with her present      medications including her beta blocker, ACE inhibitor, diuretic.  2. Hypertension.  Her blood pressure is elevated today  and we will      increase her Toprol to 100 mg p.o. daily.  3. Diabetes mellitus.  Followed by her primary care physician.  4. Hyperlipidemia.  We will check lipids and liver and adjust as      indicated.  Note, she has not tolerated Lipitor in the past.  5. History of subclinical hyperthyroidism.  Followed by her primary      care physician.  6. Tobacco abuse.  I again discussed the importance of discontinuing      this.   Of note, we will check a BMET today given her diuretic use.  Also note  that her blood pressure was elevated today but she has not taken her  medications yet, and we are increasing her Toprol.  We will see her back  in 6 months.     Denice Bors Stanford Breed, MD, [facility]  Electronically Signed    BSC/MedQ  DD: 08/09/2007  DT: 08/09/2007  Job #: [id number]   cc:   [facility]

## 2011-02-15 NOTE — Op Note (Signed)
[name], [name]                  ACCOUNT NO.:  [id number]   MEDICAL RECORD NO.:  [id number]          PATIENT TYPE:  AMB   LOCATION:  DAY                          FACILITY:  [facility]   PHYSICIAN:  Thomas A. Cornett, M.D.DATE OF BIRTH:  [birth year]   DATE OF PROCEDURE:  12/12/2007  DATE OF DISCHARGE:                               OPERATIVE REPORT   PREOPERATIVE DIAGNOSIS:  Right thigh mass.   POSTOPERATIVE DIAGNOSIS:  A 10 x 15 cm right thigh lipoma.   PROCEDURE:  Excision of right thigh lipoma (deep).   SURGEON:  Erroll Luna, MD.   ANESTHESIA:  General endotracheal anesthesia with approximately 20 mL of  0.25% Sensorcaine with epinephrine.   SPECIMEN:  Large lobulated fatty mass consistent with lipoma in the deep  fat.  No evidence of any invasion into muscle, bone or skin.   DRAINS:  None.   INDICATIONS FOR PROCEDURE:  The patient is a 60 year old female with a  mass on her right thigh.  It has been present for some time on the scale  of the years and has been slowly growing.  It has gotten to a size  though where it  is uncomfortable and she wished to have it excised.  She presents today for that.   DESCRIPTION OF PROCEDURE:  The patient is brought to the operating room,  placed supine.  After induction of general anesthesia, the right thigh  was marked preoperatively, prepped and draped in sterile fashion.  The  mass is on the lateral aspect of the right thigh.  Vertical incision was  used over it and once I incised the skin, this mass began to bulge out.  I used my hand to get around it and then dissect all the small  outcropping of the fatty tissue out of the right thigh region until the  cavity was clear.  The wound was irrigated and closed in layers with a  deep layer of 3-0 Vicryl and 4-0 Monocryl in a subcuticular stitch.  Dermabond was applied as a dressing.  All final counts of sponge, needle  and instruments were found to be correct at this portion of the case.  The patient was then extubated, awakened and taken to recovery in  satisfactory condition.      Thomas A. Cornett, M.D.  Electronically Signed     TAC/MEDQ  D:  12/12/2007  T:  12/13/2007  Job:  [id number]   cc:   [facility]

## 2011-02-15 NOTE — Assessment & Plan Note (Signed)
[facility]                         ELECTROPHYSIOLOGY OFFICE NOTE   Peggy, [name]                         MRN:          [id number]  DATE:04/23/2007                            DOB:          08/14/1951    [name] was referred today by Dr. Kirk Ruths for consideration for  prophylactic ICD implantation (SCD/HEFT).   The patient is a very pleasant 60 year old woman with a long-standing  nonischemic cardiomyopathy who had in the past been followed by Dr. Val Riles and is now followed by Dr. Stanford Breed.  The patient underwent recent  stress Myoview scan which demonstrated LV EF of 33%.  She had no  evidence of ischemia but global hypokinesis.  She returns today for  followup.  She has had no syncope.  She denies chest pain.  She does  have dyspnea with severe exertion.  Her heart failure is presently class  II.   Her additional past medical history notable for:  1. Long-standing diabetes since the 80s.  2. History of hypertension also dating back for 20 years.  3. History of congestive heart failure, dating back to 2003.   FAMILY HISTORY:  Notable for both of her parents being deceased.  Her  father at age 63 of congestive heart failure, and her mother at age 67  of a stroke.  She has one sister and two brothers all of whom are well.  The sister does have diabetes and hypertension.   PAST SURGICAL HISTORY:  Notable for tubal ligation and hysterectomy.   SOCIAL HISTORY:  Notable for no tobacco or ethanol abuse.   REVIEW OF SYSTEMS:  Notable for fatigue and occasional problem with  constipation and shortness of breath.  Otherwise, all systems reviewed  and found to be negative.   PHYSICAL EXAMINATION:  GENERAL:  She is a pleasant middle-aged woman in  no acute distress.  VITAL SIGNS:  Blood pressure today was 126/74, the pulse 78 and regular,  the respirations were 18, the weight was 227 pounds.  HEENT:  Normocephalic and atraumatic.   Pupils are equal and round.  The  oropharynx was moist.  Sclerae were anicteric.  NECK:  Revealed no jugular venous distention.  There is no thyromegaly.  Trachea is midline.  Carotids were 2+ and symmetric.  LUNGS:  Clear bilaterally to auscultation.  No wheezes, rales, or  rhonchi were present.  There is no increased work-of-breathing.  CARDIOVASCULAR:  Revealed a regular rate and rhythm with normal S1 and  S2.  PMI is enlarged and laterally displaced.  ABDOMEN:  Obese, nontender, nondistended.  No organomegaly.  Bowel  sounds are present.  No rebound or guarding.  EXTREMITIES:  Demonstrated no cyanosis, clubbing, or edema.  NEUROLOGIC:  Alert and oriented x2.  Cranial nerves intact.  Strength  5/5 and symmetric.  SKIN:  Normal.   MEDICATIONS:  1. Aspirin 81 a day.  2. Lantus insulin.  3. Toprol XL 75 a day.  4. Zocor 40 a day.  5. Aldactone 25 a day.  6. Lasix 40 a day.  7. Lisinopril 20 twice a day.  EKG demonstrates a sinus rhythm with LVH and poor R wave progression.   IMPRESSION:  1. Nonischemic cardiomyopathy.  2. Congestive heart failure.  3. Hypertension.  4. Diabetes.   DISCUSSION:  I have discussed treatment options with the patient.  The  risks, benefits, goals, and expectations of prophylactic ICD insertion  have been discussed with her and she would like to proceed with this.  This will be scheduled at the earliest possible convenient time.     Champ Mungo. Lovena Le, MD  Electronically Signed    GWT/MedQ  DD: 04/23/2007  DT: 04/23/2007  Job #: [id number]   cc:   Denice Bors. Stanford Breed, MD, [facility]  Jill Alexanders, M.D.

## 2011-02-15 NOTE — Assessment & Plan Note (Signed)
[facility]                         ELECTROPHYSIOLOGY OFFICE NOTE   KAYELENE, ELIE                         MRN:          [id number]  DATE:05/16/2007                            DOB:          1951-01-21    Ms. Terzian was seen today in the device clinic for followup of her newly  implanted [place], model #V168 ICD.  Her device was implanted on  May 04, 2007, for nonischemic cardiomyopathy.   Interrogation of her device demonstrates,  R waves of 11.3 millivolts with an RV impedance of 540 ohms and a  threshold of 0.75 volts at 0.5 milliseconds.  Her shock impedance was 40 ohms.  Her battery voltage was 3.2 volts with a charge time of 10.6 seconds.  She was in a normal sinus rhythm today.  She has had no episodes since implant.  Her Steri-Strips were removed today.  Her wound was without redness or  swelling.   She will return to the clinic in 3 months for Dr. Lovena Le.      Chanetta Marshall, RN,BSN  Electronically Signed      Champ Mungo. Lovena Le, MD  Electronically Signed   AS/MedQ  DD: 05/16/2007  DT: 05/17/2007  Job #: [id number]

## 2011-02-15 NOTE — Assessment & Plan Note (Signed)
[facility]                            CARDIOLOGY OFFICE NOTE   [name], [name]                         MRN:          [id number]  DATE:02/20/2007                            DOB:          1950-12-13    [name] returns for followup today.  She is a very pleasant 60-year-  old female who has a history of nonischemic cardiomyopathy,  hypertension, hyperlipidemia, diabetes mellitus.  Since I last saw her,  she is doing well.  There is minimal dyspnea on exertion.  There is no  orthopnea, PND, pedal edema, palpitations, presyncope, syncope, chest  pain.  Note: She does continue to smoke.   MEDICATIONS AT PRESENT INCLUDE:  1. Aspirin 81 mg daily.  2. Insulin.  3. Toprol 50 mg p.o. daily.  4. Zocor 40 mg p.o. daily.  5. Aldactone 25 mg p.o. daily.  6. Glucophage 1000 mg p.o. b.i.d.  7. Lasix 40 mg p.o. daily.  8. Lisinopril 20 mg p.o. b.i.d.   PHYSICAL EXAMINATION:  VITAL SIGNS:  Blood pressure 132/82.  Pulse 70.  Weight 228 pounds.  HEENT:  Normal.  NECK:  Supple with no bruits.  CHEST:  Clear.  CARDIOVASCULAR EXAM:  Reveals a regular rate and rhythm.  ABDOMINAL EXAM:  Benign.  EXTREMITIES:  Show no edema.   Her electrocardiogram shows a sinus rhythm at a rate of 87.  There is  anterior T wave inversion as well as lateral T wave inversion which is  more prominent than previous.  There is bi-atrial enlargement.  There  are QTs prolonged.   DIAGNOSES:  1. Abnormal electrocardiogram - Her electrocardiographic changes are      new and she does have multiple risk factors.  We will plan to risk      stratify on adenosine Myoview.  If it shows no ischemia, we will      continue with medical therapy.  Note:  She did have a cardiac      catheterization in December 2003 that showed nonobstructive      disease.  2. Hypertension - Her blood pressure is well controlled.  3. Nonischemic cardiomyopathy - I will increase her Toprol to 75 mg      p.o.  daily and we will continue with her ACE inhibitor, diuretics,      and aspirin.  I will check a BMET today to follow her potassium and      renal function.  4. Diabetes mellitus - Followed by primary care.  5. Hyperlipidemia - We will check lipids and liver and adjust with a      goal LDL of less than 70 given her history of diabetes mellitus.  6. History of subclinical hyperthyroidism.  7. Tobacco abuse - We discussed the importance of discontinuing this.   We will see her back in six months.     Peggy Bors Stanford Breed, MD, [facility]  Electronically Signed    BSC/MedQ  DD: 02/20/2007  DT: 02/20/2007  Job #: [id number]   cc:   Jill Alexanders, M.D.

## 2011-02-15 NOTE — Discharge Summary (Signed)
[name], [name]                  ACCOUNT NO.:  [id number]   MEDICAL RECORD NO.:  [id number]          PATIENT TYPE:  INP   LOCATION:  3705                         FACILITY:  [facility]   PHYSICIAN:  Champ Mungo. Lovena Le, MD    DATE OF BIRTH:  [birth year]   DATE OF ADMISSION:  05/04/2007  DATE OF DISCHARGE:  05/05/2007                               DISCHARGE SUMMARY   PROCEDURE:  Status post St. Jude ICD.   FINAL PRIMARY DISCHARGE DIAGNOSES:  1. Nonischemic cardiomyopathy with an ejection fraction of 33%.  2. Diabetes.  3. Hypertension.  4. Chronic systolic congestive heart failure.  5. Family history of congestive heart failure and cerebrovascular      accident.  6. Status post bilateral tubal ligation and hysterectomy.  7. Hyperlipidemia.   TIME SPENT AT DISCHARGE:  35 minutes.   HOSPITAL COURSE:  [name] is a 60 year old female with a history of  nonischemic cardiomyopathy.  She was evaluated by Dr. Lovena Le for  possible ICD implantation and was admitted for this on May 04, 2007.   A single-lead ICD was implanted without complication.  The next day, a  chest x-ray was without pneumothorax, and her ICD was functioning  normally on a recheck.  She was evaluated by Dr. Lovena Le and considered  stable for discharge with outpatient followup arranged.   ACTIVITY:  Her activity level is to be per the supplemental sheet.   DIET:  She is to stick to a low-sodium diabetic diet.   FOLLOW UP:  She is to get wound check on May 16, 2007 at 10:20 and  follow up with Dr. Lovena Le in 3 months.  She is to follow up with Dr.  Stanford Breed and with Dr. Redmond School as needed.   DISCHARGE MEDICATIONS:  1. Lasix 40 mg a daily.  2. Lisinopril 20 mg b.i.d.  3. Aspirin 81 mg a day.  4. Aldactone 25 mg a day.  5. Glucophage 1000 mg b.i.d.  6. Lantus 14 units q.h.s.  7. Simvastatin 40 mg q.h.s.  8. Metoprolol ER 75 mg daily.      Rosaria Ferries, PA-C      Champ Mungo. Lovena Le, MD  Electronically  Signed    RB/MEDQ  D:  05/05/2007  T:  05/06/2007  Job:  [id number]   cc:   Jill Alexanders, M.D.

## 2011-02-15 NOTE — Assessment & Plan Note (Signed)
[facility]                            CARDIOLOGY OFFICE NOTE   CHANIQUE, RAYMER                         MRN:          [id number]  DATE:12/11/2008                            DOB:          August 16, 1951    [name] is a very pleasant female who has a history of nonischemic  cardiomyopathy, hypertension, hyperlipidemia, and diabetes.  She has  also had a previous ICD placed.  Her last Myoview was performed on March 06, 2007.  At that time, her ejection fraction was 33%.  There was no  evidence of ischemia, but there was soft tissue attenuation.  Her last  echocardiogram on September 15, 2006, showed an ejection fraction of 35-  40%.  When I last saw her, her blood pressure was elevated and we  increased her Toprol to 200 mg p.o. daily.  Since then she denies any  dyspnea, chest pain, palpitations, or syncope.  There is no pedal edema.   MEDICATIONS:  1. Neurontin 300 mg p.o. daily.  2. Lisinopril 40 mg p.o. daily.  3. Celexa 20 mg p.o. daily.  4. Crestor 5 mg p.o. daily.  5. Aldactone 25 mg p.o. daily.  6. Aspirin 81 mg p.o. daily.  7. Lasix 40 mg p.o. daily.  8. Insulin.  9. Toprol 200 mg p.o. daily.   PHYSICAL EXAMINATION:  VITAL SIGNS:  Blood pressure 121/88 and pulse is  74.  HEENT:  Normal.  NECK:  Supple.  CHEST:  Clear.  CARDIOVASCULAR:  Regular rate and rhythm.  ABDOMEN:  Show no tenderness.  EXTREMITIES:  Show no edema.   DIAGNOSES:  1. Nonischemic cardiomyopathy - she will continue on her beta-blocker,      ACE inhibitor, spironolactone, and Lasix.  We will check a BMET      today to follow her potassium and renal function.  2. Hypertension - her blood pressure has improved on the higher dose      of Toprol.  We will continue the present medications.  3. Diabetes mellitus - management per primary care.  4. Hyperlipidemia - management per primary care.  5. Tobacco abuse - we discussed importance of discontinuing this.  6. History of  subclinical hyperthyroidism - management per primary      care.   She will see me back in 6 months.     Denice Bors Stanford Breed, MD, [facility]  Electronically Signed    BSC/MedQ  DD: 12/11/2008  DT: 12/11/2008  Job #: [id number]   cc:   Myriam Jacobson

## 2011-02-15 NOTE — Assessment & Plan Note (Signed)
 HEALTHCARE                         ELECTROPHYSIOLOGY OFFICE NOTE   Peggy, [name]                         MRN:          [id number]  DATE:04/02/2008                            DOB:          [birth year]    [name] returns today for followup.  She is a very pleasant middle-aged  woman with obesity and nonischemic cardiomyopathy and congestive heart  failure who is status post ICD insertion just about a year ago.  She  returns today for followup.  She has no specific complaints except for  some mild peripheral edema.  She admits to sodium indiscretion with her  diet.   MEDICATIONS:  1. Neurontin 300 a day.  2. Metformin 1 g twice daily.  3. Lisinopril 40 a day.  4. Toprol-XL 150 a day.  5. Lipitor 10 mg daily.   PHYSICAL EXAMINATION:  GENERAL:  She is a pleasant well-appearing,  obese, middle-aged woman in no distress.  VITAL SIGNS:  Blood pressure is 115/80, pulse 76 and regular,  respirations are 18, weight is 236 pounds.  NECK:  No jugular venous distention.  LUNGS:  Clear bilaterally to auscultation.  No wheezes, rales, or  rhonchi are present.  No increased work of breathing.  CARDIOVASCULAR:  Regular rate and rhythm with normal S1 and S2.  No  murmurs, rubs, or gallops are noted.  EXTREMITIES:  No cyanosis or clubbing.  There is trace peripheral edema  bilaterally.   Interrogation of her defibrillator demonstrates a Training and development officer V-168.  The R-waves are 11, the impedance is 530, the threshold is 0.5 at 0.5.  Battery voltage is 3.15 volts.  Underlying rhythm was sinus at 78 beats  per minute.  She was less than 1% V paced.   IMPRESSION:  1. Nonischemic cardiomyopathy.  2. Congestive heart failure.  3. Status post implantable cardioverter-defibrillator insertion.  4. Diabetes.  5. Hypertension.   DISCUSSION:  [name] is stable.  I have asked that she really work hard  on her diet and that she reduce the sodium intake.  I will plan  to see  the patient back in the office in 1 year.  She will follow up in her  device clinic in 3 months.     Champ Mungo. Lovena Le, MD  Electronically Signed    GWT/MedQ  DD: 04/02/2008  DT: 04/03/2008  Job #: [id number]

## 2011-02-18 NOTE — Cardiovascular Report (Signed)
NAME:  [name], [name]                            ACCOUNT NO.:  [id number]   MEDICAL RECORD NO.:  [id number]                   PATIENT TYPE:  INP   LOCATION:  2905                                 FACILITY:  [facility]   PHYSICIAN:  Quay Burow, M.D.                DATE OF BIRTH:  01-Jul-1951   DATE OF PROCEDURE:  09/18/2002  DATE OF DISCHARGE:                              CARDIAC CATHETERIZATION   PROCEDURE PERFORMED:  Cardiac catheterization.   INDICATION:  The patient is a 60 year old moderately overweight African  American female with a history of hypertension, continued tobacco use and  non-insulin requiring diabetes.  She was admitted September 17, 2002, with  chest pressure and shortness of breath.  She had anterolateral T wave  inversion and congestive heart failure on chest x-ray.  She was diuresed and  ruled out for myocardial infarction.  She presents now for diagnostic  coronary arteriography to rule out ischemic etiology.   DESCRIPTION OF PROCEDURE:  The patient was brought to the second floor  cardiac catheterization lab in the postabsorptive state.  She was  premedicated with p.o. Valium.  Her right groin was prepped and shaved in  the usual sterile  fashion.  Then 1% Xylocaine was used for local anesthesia.  A 6 French  sheath was inserted into the right femoral artery using standard Seldinger  technique. A 6 French right and left Judkins diagnostic catheter as well as  a 6 French pigtail catheter were used for selective coronary angiography,  left ventriculography, and distal abdominal aortography. Omnipaque dye was  used for the entirety of the case.  Retrograde, aortic, left ventricular,  and pullback pressures were recorded.   HEMODYNAMICS:  1. Aortic systolic pressure [id number], diastolic pressure A999333.  2. Left ventricular systolic pressure 123456, end-diastolic pressure 35.   SELECTIVE CORONARY ANGIOGRAPHY:  1. Left main:  Normal.  2. Left anterior descending:  Normal.  3. Left circumflex:  Normal.  4. Right coronary artery:  Dominant and normal.   LEFT VENTRICULOGRAPHY:  The RAO left ventriculogram was performed using 20  cc of Omnipaque dye at 10 cc/sec.  The overall LVEF was estimated at  approximately 40% with moderate global hypokinesia.   DISTAL ABDOMINAL AORTOGRAPHY:  Distal abdominal aortogram was performed  using  20 cc of Omnipaque dye at 20 cc/sec. The renal arteries were widely patent.  The infrarenal abdominal aorta and iliac bifurcation appear free of  significant atherosclerotic changes.   IMPRESSION:  The patient has normal coronary arteries and moderate left  ventricular dysfunction.  She most likely has hypertensive heart disease  from poorly controlled high blood pressure.  Her renal arteries are normal.   PLAN:  Plans will be for more aggressive medical therapy of her hypertension  as well as dietary counseling and compliance.   The ACT was measured and the sheaths were removed.  Pressure was held on the  groin to achieve hemostasis.  The patient left the lab in stable condition.  She will be hydrated, potassium repleted and her medicines will be adjusted.  She will be discharged home tomorrow.  Dr. Jill Alexanders was notified of  these results.                                                   Quay Burow, M.D.    Geralynn Rile  D:  09/18/2002  T:  09/18/2002  Job:  [id number]   cc:   Cardiac Catheterization Lab   Jill Alexanders, M.D.  [address] [place], [place] [address]  Fax: [phone]

## 2011-02-18 NOTE — Discharge Summary (Signed)
NAME:  [name], [name]                            ACCOUNT NO.:  [id number]   MEDICAL RECORD NO.:  [id number]                   PATIENT TYPE:  INP   LOCATION:  2024                                 FACILITY:  [facility]   PHYSICIAN:  Rexene Alberts, M.D.                 DATE OF BIRTH:  August 28, 1951   DATE OF ADMISSION:  12/30/2003  DATE OF DISCHARGE:  01/02/2004                                 DISCHARGE SUMMARY   DISCHARGE DIAGNOSES:  1. Acute congestive heart failure.  2. Nonischemic cardiomyopathy.     a. 2-D echocardiogram on December 30, 2003 revealed a left ventricle that        was mildly dilated.  Overall left ventricular systolic function was        mildly to moderately decreased.  Ejection fraction ranging between 35        and 45%.  Left ventricular wall thickness was mildly increased.  There        was mild asymmetric septal hypertrophy.     b. Cardiac catheterization in December of 2003 per Dr. Quay Burow.        The results revealed normal coronary arteries and moderate left        ventricular dysfunction with an ejection fraction at approximately        40%.  Most likely cardiomyopathy is secondary to hypertensive heart        disease.  3. Hypertension with hypertensive urgency on admission.  4. Type 2 diabetes mellitus.  5. Hyperlipidemia.  6. Subclinical hypothyroidism.   SECONDARY DISCHARGE DIAGNOSES:  1. Noncompliance.  2. Status post hysterectomy in the past.  3. Status post cesarean sections in the past.  4. Status post bladder tacking surgery in the past.   DISCHARGE MEDICATIONS:  1. Lasix 80 mg 1/2 tablet twice daily.  2. Potassium chloride 20 mEq daily.  3. Lisinopril 20 mg daily.  4. Norvasc 10 mg daily.  5. Lipitor 40 mg q.h.s.  6. Levoxyl 25 mcg 1/2 tablet daily.  7. Toprol-XL 25 mg daily.  8. Aspirin 81 mg daily.  9. Glucophage 850 mg b.i.d.  10.      Stop Actos.   DISCHARGE DISPOSITION:  The patient was discharged to home on January 02, 2004  in improved  and stable condition.  The patient has a hospital follow up  appointment with her primary care physician, Dr. Redmond School, on Thursday April  7th at 9:45 a.m.  The patient was advised to follow up with her  cardiologist, Dr. Gwenlyn Found, in 3 to 4 weeks.   CONSULTATIONS:  Quay Burow, M.D.   PROCEDURES PERFORMED:  A 2-D echocardiogram.  The results as above.   HISTORY OF PRESENT ILLNESS:  The patient is a 60 year old African-American  woman with a Past Medical History significant for nonischemic cardiomyopathy  who presented to the emergency department on December 30, 2003 with the chief  complaint of shortness of breath.  The patient complained of a several-day  history of worsening shortness of breath.  It particularly got worse at a  church service.  When she arrived home, her daughter-in-law came over and  then called 9-1-1 when she felt that the patient was in respiratory  distress.  When EMS arrived, the patient was in severe respiratory distress,  almost arrested.  While in the emergency room, the patient was immediately  placed on BiPAP, given Lasix 80 mg IV x1, and placed on a nitroglycerin  drip.  The patient's initial blood pressure was 240/130.  Per the history  given by the patient's family, the patient does not follow a low salt, low  fat diet and she frequently misses her medications.  At the time of hospital  discharge in December of 2003, her dry weight was noted to be 235 pounds.   HOSPITAL COURSE:  Problem 1.  CONGESTIVE HEART FAILURE:  As stated above,  the patient was treated with BiPAP, Lasix, and a nitroglycerin drip while in  the emergency department.  After several hours of treatment, the patient  diuresed over 2 liters prior to leaving the emergency department.  She was  subsequently admitted to the cardiac step-down unit.  The BiPAP was  eventually tapered and titrated off and the patient was placed on nasal  cannula oxygen starting at 4 liters per minute and then  titrated to keep the  oxygen saturations greater than 92%.  Her initial blood work was significant  for a BNP of 82, a troponin-I of 0.17, CK of 174, and a CK-MB of 5.2.  Her  initial chest x-ray revealed cardiac enlargement with diffuse pulmonary  edema.  The patient was managed with Lasix 40 mg IV q.12 h. as well as  Toprol 25 mg b.i.d., and Avapro 300 mg daily followed by potassium chloride  40 mEq b.i.d. x2 days and then tapered thereafter.  For further evaluation,  the patient's thyroid function tests were evaluated and a 2-D echocardiogram  was ordered.  The patient's TSH was elevated at 7.102 (0.350 to 5.5 normal  range).  However, the patient's free T4 was within normal limits at 1.18.  Given the patient's body habitus and also given her chronic medical  conditions, it was decided that the patient needed to be treated for  subclinical hypothyroidism.  Levoxyl at 25 mcg 1/2 tablet daily was  therefore started.  The 2-D echocardiogram revealed that the patient's  ejection fraction ranged between 35 and 45% consistent with the 2-D  echocardiogram and cardiac catheterization in December of 2003.  There was  also evidence of left ventricular wall thickness which was mildly increased  and mild asymmetric septal hypertrophy.   After 48 hours of treatment, the patient's dyspnea completely resolved.  At  the time of hospital discharge, she was actually oxygenating 94 to 96% on  room air.  The patient's troponin-I remained elevated throughout the  hospital course.  Dr. Gwenlyn Found was consulted and felt that the elevated  troponin-I was secondary to the CHF.  The patient had no complaints of chest  pain throughout the hospital course.  It was also noted that the patient's  initial EKG revealed tachycardia and nonspecific T wave and ST wave  abnormalities.  However, the follow up EKG revealed a normal sinus rhythm with a heart rate of 80 and a prolonged QTc interval.  The QT was 482 and  the QTc  was 555.  The patient's magnesium  level was assessed and found to be  within normal limits at 2.  The patient was initially hypokalemic on  admission.  However, she was repleted which potassium chloride by mouth.  At  the time that the repeat EKG was done, the patient's potassium was within  normal limits at 3.7.  The following morning another EKG was ordered.  The  QT interval had decreased to 454 and the QTc had decreased to 504.  Dr.  Gwenlyn Found felt that the patient just needed to be observed with no additional  workup at this time.   The probable cause of the patient's acute pulmonary edema is more than  likely noncompliance.  The patient admits to eating salty foods and not  consistently taking her medications.  She was not treated with a diuretic in  the outpatient setting; however, she was on a combination of lisinopril and  HCTZ.  The patient will now be sent home on Lasix 40 mg b.i.d. or Lasix 80  mg 1/2 tablet b.i.d.  The patient was admonished to take her medications  daily.  She will follow up with Dr. Gwenlyn Found in 3 to 4 weeks.   Problem 2.  HYPERTENSION WITH HYPERTENSIVE URGENCY ON ADMISSION:  As stated  above, the patient's blood pressure on admission was 230/140.  The patient  had been noncompliant with her antihypertensive medications over the past  few days.  She was treated initially with a nitroglycerin drip.  She was  subsequently treated with Toprol-XL 25 mg b.i.d. which was twice her usual  dose and Avapro 300 mg daily.  After the first 24 hours, the nitroglycerin  drip was discontinued.  The patient's blood pressures normalized.  The  Toprol was reduced back to 25 mg daily and the Avapro was decreased to 150  mg daily.  The patient will be discharged to home on her previous  medications of lisinopril, Norvasc, and Toprol-XL.  The Avapro was  discontinued at the time of hospital discharge.  The patient was advised to  discontinue the HCTZ and to take lisinopril 20 mg daily  rather than  lisinopril/HCTZ.  A prescription was given to the patient for 20 mg daily of  lisinopril.  She was advised to follow a low salt diet.  Her blood pressures  ranged between [id number] and [id number] systolically prior to hospital discharge.   Problem 3.  TYPE DIABETES MELLITUS:  The patient was initially treated with  a sliding scale insulin regimen during the first 24 hours of  hospitalization.  Following resolution of the patient's CHF, she was  restarted on Glucophage at 500 mg b.i.d. and then titrated up to 850 mg  b.i.d., the patient's dose prior to hospital admission.  Amaryl was started  at 4 mg daily.  However, because of the cost, this medication was  discontinued and she was placed on glipizide 2.5 mg b.i.d.  The patient's  hemoglobin A1C was found to be 9.1.  The sliding scale insulin regimen was  discontinued approximately 24 to 36 hours prior to hospital discharge.  Her capillary blood sugars were well controlled during the last 24 hours of  hospitalization, ranging between 80 and 110.  Given the relatively good  control of her capillary blood sugars, the glipizide was discontinued and  the Glucophage was continued at 850 mg b.i.d.  The Actos was discontinued  secondary to its propensity to cause CHF.  A diabetes teaching pamphlet was  given to the patient at hospital discharge.   Problem  4.  HYPERLIPIDEMIA:  The patient takes Lipitor 20 mg twice daily per  her primary care physician.  The patient often receives samples of her  medications specifically the Lipitor.  The patient states that she does not  take her Lipitor on a regular basis, trying to make the medication stretch  out throughout the month.  Her fasting lipid panel revealed a total  cholesterol of 208, triglycerides of 140, HDL cholesterol of 65 and an LDL  cholesterol of 115.  The dose of Lipitor will be maintained at 40 mg daily  given that the patient's fasting lipid panel may reflect noncompliance  rather than  the ineffectiveness of the Lipitor.   The patient was given several prescriptions for new medications including  the Lasix, potassium, Levoxyl, and the lisinopril.  The hospital pharmacy  was able to provide the patient with at least one week's supply of each one  of these medications.  The patient was advised to ask her primary care  physician for additional samples.  She was also advised to ask family  members for help with her medications.                                                Rexene Alberts, M.D.    DF/MEDQ  D:  01/02/2004  T:  01/04/2004  Job:  [id number]   cc:   Jill Alexanders, M.D.  [address]  [place], [place] [address]  Fax: [phone]   Quay Burow, M.D.  Fax: [phone]

## 2011-02-18 NOTE — H&P (Signed)
NAMEJEWELENE, PILAND                  ACCOUNT NO.:  [id number]   MEDICAL RECORD NO.:  [id number]          PATIENT TYPE:  EMS   LOCATION:  MAJO                         FACILITY:  [facility]   PHYSICIAN:  Cherene Altes, M.D.DATE OF BIRTH:  1951-05-14   DATE OF ADMISSION:  08/02/2006  DATE OF DISCHARGE:                                HISTORY & PHYSICAL   PRIMARY CARE PHYSICIAN:  Dr. Jill Alexanders.   CHIEF COMPLAINT:  Shortness of breath.   HISTORY OF PRESENT ILLNESS:  [name] is a pleasant 60 year old female  who has a history of medical noncompliance with her medication regimen. She  freely admits to me that she has not seen Dr. Redmond School in quite a long  time, and that she furthermore ran out of her medication months ago, and  therefore has not been taking anything. She continues to smoke approximately  1/2 to one pack per day and drinks maybe a little too much on a regular  basis. Despite this she was in her usual state of health until approximately  two days ago. She then began to experience shortness of breath. At first  this was primarily with exertion. As the last 48 hours have unfolded,  however, this has become worse. This has progressed to the point that she is  now short of breath even with resting. She has not been able to lay flat  because of shortness of breath. She denies peripheral edema. She denies  chest pain or chest pressure. She has not had a headache. She has not had  cough. There have been no fevers or chills. She has been resting comfortably  at night. As noted the patient does admit to noncompliance with her  medications.   REVIEW OF SYSTEMS:  Comprehensive Review of Systems is carried out and is  unremarkable with the exception of positive illness noted in the History of  Present Illness above.   MEDICATIONS:  1. Aspirin 81 mg daily.  2. Lasix 80 mg daily.  3. Caduet dose unknown.  4. Toprol XL dose unknown.  5. Potassium chloride 20 mEq daily.  6.  Lantus insulin 14 units q.h.s.  7. Metformin 1 g b.i.d.   Please note, however, that the patient freely admits she has not been taking  any of her medications for quite some time.   ALLERGIES:  There are no known drug allergies.   FAMILY HISTORY:  This patient has two maternal aunts who both died with  colon cancer. Family history is, otherwise, reviewed and is unremarkable.   SOCIAL HISTORY:  The patient partakes of alcohol on a regular basis possibly  in excess of two drinks per day. The patient is married. She has a tenth  grade education. She works as a Quarry manager.   DATA REVIEWED:  BNP is normal at 81. Hemoglobin, platelet count, white  count, and MCV are all normal. Sodium is normal. Chloride bicarb is normal.  BUN and creatinine are normal. Potassium is low at 3.3. BUN is 5, creatinine  0.6. Serum glucose is elevated at 221. Calcium 9.1. LFTs are  all normal.  Albumin 3.5. CK 49, CK-MB 20, troponin I less than 0.5.   Chest x-ray reveals cardiomegaly with moderate interstitial edema and small  effusions. A 12-lead EKG reveals normal sinus rhythm without acute ST or T  wave changes when compared to old exams.   PHYSICAL EXAMINATION:  VITAL SIGNS:  Temperature 96.8, blood pressure  199/121 to 202/104 to 140/83, heart rate 97, respiratory rate 19, O2 sats  99% on room air, CBG 250.  GENERAL:  A well-developed, well-nourished female in moderate respiratory  distress lying on a hospital stretcher.  HEENT:  Normocephalic, atraumatic. Pupils equal, round, reactive to light  and accommodation. Extraocular muscles are intact. OC/OP clear.  NECK:  No JVD, no lymphadenopathy, no thyromegaly but neck is very short and  the patient is obese.  LUNGS:  Bibasilar crackles, no wheezes. Good air movement throughout all  fields, otherwise.  CARDIOVASCULAR:  Regular rate and rhythm without murmur, gallop, and rub.  Normal S1 and S2.  ABDOMEN:  Obese, soft, bowel sounds present. No hepatosplenomegaly,  no  rebound, no ascites.  EXTREMITIES:  No significant cyanosis, clubbing bilateral lower extremities.  There is 1+ edema diffusely including the lower extremities and upper  extremities.  NEUROLOGICAL:  5/5 strength in bilateral upper and lower extremities.  Cranial nerves II-XII are intact bilaterally. Alert and oriented x4.   IMPRESSION/PLAN:  1. Acute congestive heart failure exacerbation - the patient has known      nonischemic cardiomyopathy. This seems clearly to be related to      malignant hypertension. Her acute pulmonary edema and shortness of      breath seems to be due to once again uncontrolled malignant      hypertension. With one dose of labetalol at 20 mg IV, the patient's      blood pressure has now dropped to 140/83 in the emergency room. I will      place the patient on IV nitroglycerin drip to assure that she obtains      consistent control. Will place her in the stepdown unit so that we may      monitor her blood pressure very closely. We will also administer ACE      inhibitor and beta blocker. I have counseled the patient extensively as      the need to strict compliance with her medical regimen. Repeat      echocardiogram is not indicated at the present time.  2. Malignant hypertension - patient has severely uncontrolled      hypertension. This is simply due to noncompliance with her medication      regimen. We will resume the above listed medications and monitor the      patient's blood pressure in house. Nitroglycerin will be added to      assist with acute control. Further agents will be added as appropriate.  3. Uncontrolled diabetes mellitus - the patient's diabetes is clearly very      poorly controlled with CBG 250 and serum glucose 221. The patient      admits to not using her Lantus or metformin. In the present setting I      will not begin her metformin. I will place her on Lantus and also     provide standing meal coverage as well as sliding scale  insulin.  4. Hypokalemia. The patient has a mild hypokalemia with a potassium 3.3.      In the setting of pulmonary edema and aggressive diuretic use, we will  need to replete this. I will check a magnesium level as well.  5. Obesity - the patient has been counseled as to the link between her      diabetes, uncontrolled hypertension, medical noncompliance, and her      obesity. She has been advised that weight loss would be appropriate and      would help with her long-term prognosis.  6. Tobacco abuse. The patient has been counseled by this physician as to      the direct connection between tobacco abuse and vascular disease. She      has been advised especially in the setting of uncontrolled hypertension      and diabetes that she must discontinue smoking immediately. Tobacco      cessation consultation will be requested during this hospital stay.  7. Medical noncompliance - I have counseled the patient extensively as to      the absolute need for strict control of her diabetes and her      hypertension. I have explained to her that ongoing noncompliance will      most certainly lead to severe complications to include severe      peripheral vascular disease with possible amputation, further      congestive heart failure, and possible myocardial infarction as well as      stroke. I have explained to her that this is an absolute must if she      wishes to preserve her quality of life.  8. Alcohol abuse. The patient admits to drinking at least two drinks per      day. She does not like to talk about this, and it is my impression that      she likely drinks more. I have advised her in the setting of diabetes      and uncontrolled hypertension that she should discontinue alcohol abuse      altogether. She does not wish to receive assistance with abstention at      this point.      Cherene Altes, M.D.  Electronically Signed     JTM/MEDQ  D:  08/02/2006  T:  08/02/2006  Job:   [id number]   cc:   Jill Alexanders, M.D.

## 2011-02-18 NOTE — H&P (Signed)
NAME:  [name], [name]                            ACCOUNT NO.:  [id number]   MEDICAL RECORD NO.:  [id number]                   PATIENT TYPE:  INP   LOCATION:  1823                                 FACILITY:  [facility]   PHYSICIAN:  Jonna L. Gwynneth Aliment, M.D.            DATE OF BIRTH:  12-09-1950   DATE OF ADMISSION:  12/30/2003  DATE OF DISCHARGE:                                HISTORY & PHYSICAL   CHIEF COMPLAINT:  Difficulty breathing.   HISTORY OF PRESENT ILLNESS:  The history is obtained from the patient's  husband, daughter, daughter-in-law, and mother as the patient herself is far  to dyspneic to talk.  The patient is a 60 year old African American female  with a several day history of worsening shortness of breath.  She complained  of dyspnea this evening at a church service that got even worse when she got  home.  She called her daughter-in-law who came over and then called 911.  EMS found the patient in severe respiratory distress, almost arrested.  In  the emergency room, she was put on BiPAP.  She received Lasix 80 IV,  nitroglycerin drip.  Her initial blood pressure was 240/130.  The history  from the family indicates that the patient is not eating a low salt diet and  frequently misses taking her medications.  When she was admitted to the  hospital in December of 2003, at discharge her dry weight was noted to be  235.   PAST MEDICAL HISTORY:  1. Congestive heart failure.  She has had this for a number of years.  In     her 2003 admission, she had a catheterization done which showed normal     arteries and ejection fraction of 40% with global hypokinesis.  She had     normal renal and iliac arteries.  At that time, she was discharged on     Diovan and hydrochlorothiazide but sometime in the interim, she is no     longer on ACE or ARBs.  2. Her hemoglobin in December of 2003 was 13.7.  3. Type 2 diabetes.  She has had this for many years and has currently been     on Actos.  Her  daughter, who is a Marine scientist, says that her sugars are     probably in poor control.  It is not clear of how often, or if, the     patient checks them very much.   ALLERGIES:  VICODIN, but the family is not sure what reaction she gets to  it.   PAST SURGICAL HISTORY:  1. Hysterectomy.  2. C-sections.  3. Bladder tack.   FAMILY HISTORY:  1. Cancer of the colon.  2. Hypertension.  3. Diabetes.   SOCIAL HISTORY:  She smoked a pack per day.  She drinks a six-pack of beer  per week plus some alcohol on weekends.  According to her daughter,  she is  drinking too much.   MEDICATIONS:  1. Norvasc 10.  2. Actos 30.  3. Hydrochlorothiazide 25.  4. Metformin.  5. Toprol 25 b.i.d.   REVIEW OF SYMPTOMS:  Not obtainable from the patient and tried to do basic  review with the family.  They know of no fever or visual problems.  She has  not been told of asthma or COPD.  They are not aware of any colon or liver  problems.  No breast or psychiatric problems.  No thyroid trouble.  Her TSH  was checked at her last admission and was normal.  No history of CVA.  No  burning or numbness.  They have noted problems with her skin including one  episode of shingles and they said she always complains of dry skin and  itching secondary to the diabetes.   PHYSICAL EXAMINATION:  GENERAL APPEARANCE:  An overweight African American  female on BiPAP.  She can nod yes and no but she is still too tired and  dyspneic to be able to really speak.  VITAL SIGNS:  Blood pressure at present is 130/74 on nitroglycerin drip.  Pulse is 122, respiratory rate is 26. Foley shows the patient has diuresed  one liter.  Repeat examination 20 minutes later shows the blood pressure is  now 119/60 off nitroglycerin drip, pulse down to 105.  HEENT:  Conjunctivae and lids are normal.  Pupils are reactive.  She seems  to have normal hearing.  NECK:  The neck shows some JVD, no masses or thyromegaly.  LUNGS:  Respiratory effort is  increased.  Her lungs have diffuse rales two  thirds of the way up.  Apparently came in frothing.  There is no dullness.  CARDIOVASCULAR:  She is tachycardic, normal S1 and S2.  Really cannot hear  any murmurs over the rales.  She has 1+ peripheral edema.  BREASTS:  No masses, tenderness, or discharge.  ABDOMEN:  The abdomen is nontender with normal bowel sounds.  There is no  overt hepatosplenomegaly or hernias but I cannot examine the patient lying  down because of her dyspnea.  MUSCULOSKELETAL:  There is no cervical adenopathy.  Muscle strength to be  5/5 and full range of motion in all four extremities.  SKIN:  I note no skin rashes, lesions or nodules.  NEUROLOGIC:  The patient is not up for cooperating with a neurologic  examination.  She is arousable.   LABORATORY DATA:  Initial laboratory work is notable for potassium of 3.2,  BUN 14, creatinine 0.9, glucose 368.  Hemoglobin 17.3 with hematocrit of 51.  She initially came in with a pH of 7.156 and bicarb of 16.  She is now  improved to pH of 7.312 and a bicarb of 24.4.  Troponin is negative.   IMPRESSION:  1. Acute pulmonary edema.  Keep her on her beta-blocker, diuretic, switch     her to Imdur.  Put her on an ARB.  2. Malignant hypertension. She will continue on the nitroglycerin drip until     the Lasix is diuresed off more.  3. Probable secondary polycythemia.  Had them phlebotomize her 200 mL in the     emergency room and she got good improvement.  I am thinking she probably     has chronic hypoxemia either from the smoking with an unrecognized     chronic obstructive pulmonary disease, even perhaps a chronic moderate     grade congestive heart failure.  I have  spoken with her daughter who is a     Marine scientist and discussed the fact that she might have developed a polycythemia     vera but this would have to be evaluated at a later time.  4. Hypokalemia.  This is being replaced p.o. and IV. 5. Type 2 diabetes.  She will be put  on sliding scale, check an A1C.  I feel     in her case, troglitazones are contraindicated due to her severe     congestive heart failure.  I will switch her to Amaryl and Metformin but     she may end up needing to go on insulin.  6. Possible chronic obstructive pulmonary disease.  7. Noncompliance tobacco and alcohol issues, none of which are contributing     to improving her congestive heart failure.  I spoke with her family     regarding compliance issues and I hope that they can prevail upon the     patient the severity of her problem this time and improve.                                                Jonna L. Gwynneth Aliment, M.D.    Kirby Crigler  D:  12/30/2003  T:  12/30/2003  Job:  [id number]   cc:   Jill Alexanders, M.D.  [address]  [place], [place] [address]  Fax: [phone]

## 2011-02-18 NOTE — Discharge Summary (Signed)
[name], [name]                  ACCOUNT NO.:  [id number]   MEDICAL RECORD NO.:  [id number]          PATIENT TYPE:  INP   LOCATION:  2925                         FACILITY:  [facility]   PHYSICIAN:  Neysa Bonito, MD  DATE OF BIRTH:  30-May-1951   DATE OF ADMISSION:  08/02/2006  DATE OF DISCHARGE:  08/04/2006                                 DISCHARGE SUMMARY   DISCHARGE DIAGNOSES:  1. Acute congestive heart failure preservation.  2. Malignant hypertension.  3. Nonischemic cardiomyopathy.  4. Type 2 diabetes, uncontrolled.  5. Hyperlipidemia.  6. Subclinical hyperthyroidism.   DISCHARGE MEDICATIONS:  1. Aspirin 81 mg daily.  2. Lasix 40 mg orally daily.  3. Lantus 50 mg at night.  4. Lisinopril 10 mg p.o. daily.  5. Toprol XL 50 mg p.o. daily.  6. Zocor 40 mg at night.  7. Aldactone 25 mg orally daily.   DISCHARGED DIAGNOSIS:   ACUTE DECOMPENSATION OF CHF.   MALIGANAT HYPERTENSION.   PROCEDURE IN THIS ADMISSION:  Chest x-ray on the admission day shown  cardiomegaly post MI interstitial edema possible pleural effusion.  Repeat  of chest x-ray on August 03, 2006 showing a resolving interstitial edema  with stable cardiomegaly with no change in size.   HOSPITAL COURSE:  Patient was admitted with a chief compliant of shortness  of breath.  Please refer to the complete H&P that was dictated on the  August 02, 2006 by Dr. Johney Maine Clung.  1. The malignant hypertension.  Patient on admission was found to have      severely elevated blood pressure.  She was admitted to stepdown unit      for continuous monitoring of the blood pressure.  Was started      nitroglycerin, and lisinopril, plus beta blocker Toprol XL with good      control of the blood pressure.  Blood pressure now on discharge is      systolic [id number] and diastolic [id number].  2. Acute exacerbation of congestive heart failure with evidence of      interstitial edema on the chest x-ray.  Patient was improved since  admission with diuresis.  She does not have any chest pain or shortness      of breath.  She was ruled out during hospital admission and now she can      go back to the Lasix p.o. and aldactone and follow up with the      medication as prescribed.  She is stable for discharge.  3. Uncontrolled diabetes.  Patient was treated by insulin sliding scale on      Lantus.  Diabetes is fairly controlled in the hospital.  She will need      to follow up her primary physician.  One of the problem being the      noncompliance and she ran out of medication and the inconsistency in      taking the medication.  Patient was counseled on the importance of      continuously taking medication was stressed.  4. Hypokalemia.  Patient was hypokalemic in  the hospital here with      potassium being repleted.  Last potassium is 3.6 and aldactone was      added and together was decrease of the Lasix pills.  Patient was also      encouraged to eat a lot of potassium containing vegetables.  5. Tobacco abuse.  Patient was kept on nicotine patch during hospital      stay.  She was advised to quit smoking and patient understands the      rationale of quitting smoking at this moment.  6. Alcohol abuse.  Patient admits on discharge to drinking at least two      drinks per day.  Her liver function does not reflect that at the moment      but patient was counseled to moderation.      Neysa Bonito, MD  Electronically Signed     EME/MEDQ  D:  08/04/2006  T:  08/05/2006  Job:  [id number]

## 2011-02-18 NOTE — Assessment & Plan Note (Signed)
[facility]                            CARDIOLOGY OFFICE NOTE   CARRERA, PENGELLY                         MRN:          [id number]  DATE:09/06/2006                            DOB:          Nov 12, 1950    [name] is a 60 year old female with a past medical history of  diabetes mellitus, hypertension, hyperlipidemia and nonischemic  cardiomyopathy, who we are asked to evaluate for congestive heart  failure.  The patient apparently has a history of hypertension and  congestive heart failure.  I do have a cardiac catheterization report  from December of 2003.  At that time, she presented with shortness of  breath and chest pain and also had congestive heart failure.  She  underwent cardiac catheterization by Dr. Quay Burow.  She had normal  coronary arteries and her ejection fraction was 40% with moderate global  hypokinesis.  Since then, she has done reasonably well.  However, there  is also an issue of compliance by report.  She was recently readmitted  to [facility] on August 02, 2006.  She was found to be in  congestive heart failure and was found to have severely elevated blood  pressures at the time of her admission with a blood pressure of 199/121.  I note at the time she was not taking her medications, as she states she  had run out.  She was reinitiated on her medications and subsequently  improved.  She was also treated for uncontrolled diabetes mellitus.  Since then, she has done well.  She has minimal dyspnea on exertion, but  there is no orthopnea, PND, pedal edema, palpitations, presyncope,  syncope of chest pain.   PREOPERATIVE MEDICATIONS:  1. Aspirin 81 mg p.o. daily.  2. Insulin.  3. Toprol 50 mg p.o. daily.  4. Zocor 40 mg p.o. nightly.  5. Aldactone 25 mg p.o. daily.  6. Glucophage 1000 mg p.o. b.i.d.  7. Lisinopril 20 mg p.o. daily.  8. Lasix 40 mg p.o. daily.   ALLERGIES:  She has no known drug allergies.   SOCIAL HISTORY:  She does have a history of tobacco use, but states she  has not smoked since discharge in early November.  She also has a  history of using alcohol, but states she has not had any alcohol since  her discharge as well.   FAMILY HISTORY:  Significant for congestive heart failure in her father.   PAST MEDICAL HISTORY:  1. Hypertension.  2. Hyperlipidemia.  3. Diabetes mellitus.  4. She has a history of congestive heart failure and nonischemic      cardiomyopathy as outlined in the HPI.  5. She has a history of subclinical hyperthyroidism.  6. She has had a previous hysterectomy.   REVIEW OF SYSTEMS:  She denies any headaches or fever or chills.  She  has had a cold recently.  There is no productive cough or hemoptysis.  There is no dysphagia, odynophagia, melena or hematochezia.  There is no  dysuria or hematuria.  There is no rash or seizure activity.  There is  no orthopnea, PND or pedal edema.  The remaining systems are negative.   PHYSICAL EXAM:  Blood pressure of 152/86 and her pulse is 86.  She  weighs 231 pounds.  She is well-developed and somewhat obese.  She is in no acute distress.  Her skin is warm and dry.  She does not appear to be depressed and there  is no peripheral clubbing.  HEENT:  Unremarkable with normal eyelids.  BACK:  Normal.  NECK:  Supple with a normal upstroke bilaterally and I cannot appreciate  bruits.  There is no jugular venous distention and no thyromegaly is  noted.  CHEST:  Clear to auscultation with normal expansion.  CARDIOVASCULAR:  Exam reveals a regular rate and rhythm with normal S1  and S2.  There are no murmurs, rubs, or gallops noted.  ABDOMEN:  Not tender or distended.  Positive bowel sounds.  No  hepatosplenomegaly and no mass appreciated.  There is no abdominal  bruit.  She has 2+ femoral pulses bilaterally and no bruits.  EXTREMITIES:  Show no edema and I can palpate no cords.  She has 2+  dorsalis pedis pulses  bilaterally.  NEUROLOGICAL:  Exam is grossly intact.   ELECTROCARDIOGRAM:  Shows normal sinus rhythm at a rate of 86.  There is  lateral T wave inversion.   DIAGNOSES:  1. History of nonischemic cardiomyopathy.  2. History of congestive heart failure secondary to systolic      dysfunction.  3. Hypertension.  4. Diabetes mellitus.  5. Hyperlipidemia.  6. History of noncompliance.   PLAN:  [name] is much improved after reinitiating her medications.  We will plan to repeat her echocardiogram to re-quantify her LV  function.  I will also check a BMET and a BNP today to make sure that  her potassium and renal function are stable.  I have asked her to  increase her lisinopril to 20 mg p.o. b.i.d. for better blood pressure  control and for optimal management of her nonischemic cardiomyopathy.  She will get a BMET in 1 week to follow her potassium and renal  function.  She will follow up with me in 8 weeks and we will most likely  increase her Toprol at  that point, if tolerated by pulse and blood pressure.  She will need to  continue off her tobacco and alcohol.  I will see her back in 8 weeks as  described above.     Denice Bors Stanford Breed, MD, [facility]  Electronically Signed    BSC/MedQ  DD: 09/06/2006  DT: 09/07/2006  Job #: [id number]   cc:   Jill Alexanders, M.D.

## 2011-02-18 NOTE — H&P (Signed)
[facility]  Patient:    [name], [name]                           MRN: [id number] Adm. Date:  07/05/00 Attending:  Dietrich Pates A. Quincy Simmonds, M.D.                         History and Physical  DATE OF PREOPERATIVE HISTORY AND PHYSICAL:  June 30, 2000  DATE OF BIRTH:  January 25, 1951  CHIEF COMPLAINT:  Menorrhagia, mixed incontinence, pelvic organ prolapse, and left-sided abdominal pain.  HISTORY OF PRESENT ILLNESS:  The patient is a 60 year old gravida 3, para 3 African-American female, status post bilateral tubal ligation, who has a 30-month history of heavy menses and abdominal discomfort.  The patient reports monthly menstruations that last five days and require pad changes of less than q.1h. due to heavy clotting.  The patient also has dysmenorrhea. The patient also reports a left-sided mid abdominal pain which has been waxing and waning in nature.  Pelvic ultrasound performed March 23, 2000, documented an anterior fundal fibroid measuring 2.8 x 2.2 x 2.0 cm.  An endometrial stripe was noted to be 4.4 mm.  The right ovary was not enlarged and contained a 1.1 cm cyst.  There was a cystic area adjacent to the ovary which measured 1.5 cm.  The left ovary was unremarkable, and there was no evidence of any free fluid in the cul-de-sac.  An endometrial biopsy documented benign secretory endometrium.  The patients last Pap smear was performed in October 2000, and was normal.  The patient also has a history of leakage of urine with coughing and sneezing and urge associated with urge incontinence.  This has been a social problem for the patient.  The patient underwent preoperative urodynamic testing which showed mixed incontinence with a leak point pressure of 80 cm of water.  The patient had a normal voiding pattern with a post void residual which was 0. The patient was noted to have some sensory urgency.  The patient has stated that she has no desire for  future childbearing.  OBSTETRICAL AND GYNECOLOGICAL HISTORY:  Three vaginal deliveries.  The patient denies a history of any abnormal Pap smears.  She is status post bilateral tubal ligation.  Her last mammogram was performed in November 2000, and was normal.  She does not have a history of any abnormal mammograms.  PAST MEDICAL HISTORY: 1. Non-insulin-dependent diabetes mellitus. 2. Hypertension. 3. Tobacco abuse.  The patient smokes approximately 8-9 cigarettes per day. 4. History of anemia.  PAST SURGICAL HISTORY: 1. Status post bilateral tubal ligation. 2. Status post umbilical herniorrhaphy as a newborn. 3. Status post tonsillectomy.  MEDICATIONS: 1. ______  500 mg p.o. q.d. 2. Glucophage 500 mg p.o. b.i.d. 3. Glyburide 3 mg 1/2 tablet p.o. q.d. 4. Indapamide 2.5 mg p.o. q.d. 5. Centrum 1 p.o. q.d.  ALLERGIES:  No known drug allergies.  SOCIAL HISTORY:  The patient is married.  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  The patient smokes 8-9 cigarettes per day, and she has been smoking for the last 10-15 years.  The patient reports that she does drink alcohol, and she denies the use of illicit drugs.  FAMILY HISTORY:  The patient has two maternal aunts with colon cancer.  She otherwise denies a family history of breast, uterine, or ovarian cancer.  PHYSICAL EXAMINATION:  VITAL SIGNS:  Weight  is 243-1/2 pounds, blood pressure is 120/84.  GENERAL:  Obese, pleasant female, in no acute distress.  NECK:  No evidence of thyromegaly.  No evidence of any cervical adenopathy or supraclavicular adenopathy.  LUNGS:  Clear to auscultation bilaterally.  HEART:  Regular rate and rhythm, and no evidence of a murmur.  BREASTS:  Documents no dominant masses, skin retractions, nipple discharge, or axillary adenopathy.  ABDOMEN:  Transverse 15 cm incision at the level of the umbilicus.  The umbilicus is absent.  There is no evidence of herniation.  There is also  a vertical lower abdominal incision below the level of the umbilicus, and there is no evidence of a hernia.  There is some mild left mid abdominal discomfort to deep palpation.  There is no evidence of any guarding or rebound or palpable masses.  There is no evidence of hepatosplenomegaly or organomegaly.  PELVIC:  No palpable inguinal lymph nodes.  The external genitalia is normal, and the urethra is unremarkable.  Vaginal exam demonstrates a first-degree cystocele with good apical support and a second-degree rectocele.  The cervix and vagina show no lesions.  The uterus is small, anteverted, mobile, and nontender.  There is no evidence of any adnexal mass or tenderness.  RECTOVAGINAL:  Confirms the above, and the stool has been noted to be guaiac-negative.  ASSESSMENT: 1. Menorrhagia.  The patient does have a fibroid noted on pelvic ultrasound.    The patient is status post bilateral tubal ligation and would like no    further childbearing and definitive therapy for her bleeding. 2. Mixed incontinence documented by urodynamic testing. 3. Mild pelvic organ prolapse. 4. Left mid abdominal discomfort. 5. History of tobacco abuse. 6. Adult onset diabetes mellitus. 7. Hypertension.  PLAN: 1. The patient will undergo a total abdominal hysterectomy with bilateral    salpingo-oophorectomy, abdominal Burch procedure, cystoscopy, suprapubic    catheter placement, and posterior colporrhaphy at [facility] on    July 05, 2000.  The risks, benefits, and alternatives have been discussed    with the patient, and she chooses to proceed with surgery. 2. Smoking cessation has been discussed with the patient. 3. The patient will undergo a bowel preparation of magnesium citrate prior to    her surgery. 4. The patient will receive prophylactic antibiotics with cefazolin. 5. The patient will have DVT prophylaxis with TEDD hose and compression    stockings.  ADDENDUM:  Preoperative  laboratory testing has documented a potassium of 3.0.  The patient was, therefore, given a prescription for K-Dur 20 mEq p.o. b.i.d. and was encouraged to increase the potassium content in her diet.  The patient will have her potassium checked the morning prior to her surgery. DD:  07/04/00 TD:  07/04/00 Job: [id number]

## 2011-02-18 NOTE — Discharge Summary (Signed)
NAME:  GEAN, STEFANOWICZ                            ACCOUNT NO.:  [id number]   MEDICAL RECORD NO.:  [id number]                   PATIENT TYPE:  INP   LOCATION:  [id number]                                 FACILITY:  [facility]   PHYSICIAN:  Quay Burow, M.D.                DATE OF BIRTH:  March 20, 1951   DATE OF ADMISSION:  09/17/2002  DATE OF DISCHARGE:  09/19/2002                                 DISCHARGE SUMMARY   DISCHARGE DIAGNOSES:  1. Congestive heart failure, improved at discharge.  2. Noncompliance with medicines.  3. Cardiomyopathy, nonischemic with ejection fraction of 40%.  4. Normal coronaries.  5. Treated hypertension.  6. Non-insulin-dependent diabetes.   HOSPITAL COURSE:  The patient is a 60 year old female with a history of non-  insulin-dependent diabetes and hypertension who presented September 17, 2002,  with shortness of breath and chest pain consistent with angina.  She was  admitted to telemetry.  At home her medications are listed as Glucophage 500  mg b.i.d., Actos  15 mg daily,  Norvasc 10 mg daily, indapamide 2.5 mg daily.  CK-MBs and  troponin's were obtained, troponin was slightly elevated at 0.05.  EKG was  abnormal with lateral T wave inversion.  She was treated with IV nitrates  and nitroglycerin.  Blood pressure was accelerated on admission as well.  She was taken to the catheterization laboratory September 18, 2002.  Catheterization revealed normal coronaries with an EF of 40% with global  hypokinesis.  She had normal renal arteries, normal iliac arteries.  2-D  echocardiogram was obtained during this hospitalization, confirmed an EF of  40.  We felt the patient could be discharged September 19, 2002.  Her  medications were adjusted.  Discharge weight was 235 pounds.  At discharge  apparently the care manager spoke with the patient and at that time the  patient admitted that she had not been taking her medications because I  didn't want to.   DISCHARGE  MEDICATIONS:  1. Diovan HCTZ 160/25 daily.  2. Toprol XL 25 mg daily.  3. Norvasc 10 mg daily.  4. Glucophage 500 mg twice a day and start September 21, 2002.  5. Baby aspirin daily.  6. Actos as taken at home.   DISCHARGE INSTRUCTIONS:  She will have a BNP in one week.  She is to stop  Lozol.   FOLLOW UP:  She has a follow up in Dr. Kennon Holter office December 26 at 11:00  a.m.   LABORATORY DATA:  Chest x-ray on admission showed evidence of pulmonary  edema, follow up x-ray showed clearing of this.  At discharge white count  8.2, hemoglobin 13.7, hematocrit 41.5, platelets 231, INR 1.0, sodium 136,  potassium 3.8, BUN 9, creatinine 0.7, hemoglobin A1c 11, CK-MB were  negative, troponin initially on  admission was 0.05.  Lipid profile shows a cholesterol of 213, HDL 71, LDL  113, TSH  2.68.  EKG showed sinus rhythm, lateral T-wave inversion.   DISPOSITION:  Patient is discharged in stable condition and will follow up  as directed above.       Erlene Quan, P.A.                      Quay Burow, M.D.    Meryl Dare  D:  10/17/2002  T:  10/17/2002  Job:  [id number]   cc:   Jill Alexanders, M.D.  [address] [place], [place] [address]  Fax: [phone]

## 2011-02-18 NOTE — Discharge Summary (Signed)
[facility]  Patient:    [name], [name]                      MRN: [id number] Adm. Date:  [id number] Disc. Date: [id number] Attending:  Josefa Half A                           Discharge Summary  ADMISSION DIAGNOSES: 1. Menorrhagia. 2. Incontinence. 3. Pelvic organ prolapse. 4. Left-sided midabdominal pain. 5. History of hypokalemia. 6. Hypertension. 7. Adult onset diabetes mellitus. 8. History of tobacco abuse.  DISCHARGE DIAGNOSES: 1. Same as above. 2. Status post total abdominal hysterectomy, bilateral salpingo-oophorectomy,    lysis of adhesions, abdominal Burch procedure, cystoscopy, suprapubic    catheter placement, and posterior colporrhaphy. 3. Intraoperative finding of upper abdominal possible retroperitoneal mass.    Normal abdominal CT scan of the abdomen.  OPERATIONS/PROCEDURES PERFORMED: 1. Total abdominal hysterectomy with bilateral salpingo-oophorectomy, lysis of    adhesions, abdominal Burch procedure, cystoscopy, suprapubic catheter    placement, and posterior colporrhaphy performed under the direction    of Dr. Josefa Half on July 05, 2000. 2. Status post abdominal CT scan on July 08, 2000, which was negative for an    abdominal mass.  PERTINENT PATIENT HISTORY:  The patient was a 60 year old gravida 3, para 3, African-American female, status post bilateral tubal ligation, who had menorrhagia, abdominal discomfort, and leakage of urine with coughing and sneezing.  The patient wished for definitive therapy for the bleeding and leaking of urine.  PAST MEDICAL HISTORY:  Significant for non-insulin-dependent diabetes mellitus, hypertension, tobacco abuse, and a recent diagnosis of hypokalemia which was made preoperatively.  ADMISSION PHYSICAL EXAMINATION:  ABDOMEN:  Significant for an abdominal exam which demonstrated a 15 cm transverse incision at the level of the umbilicus which had no evidence of herniation.  The  umbilicus is surgically absent.  There was also a vertical lower abdominal incision below the level of the umbilicus also without evidence of hernia.  There was some mild left midabdominal discomfort to deep palpation with no evidence of any guarding or rebound.  There was no evidence of hepatosplenomegaly or organomegaly.  GENITOURINARY:  Pelvic exam documented no palpable inguinal lymph nodes.  The external genitalia and urethra were normal.  The vaginal exam demonstrated a first degree cystocele, good apical support, and a second degree rectocele. The cervix and vagina showed no lesions.  The uterus was small, anteverted, mobile, and nontender.  There was no evidence of any adnexal masses or tenderness.  Rectovaginal exam confirmed the bimanual exam.  LABORATORY AND X-RAY DATA:  Significant preoperative laboratory studies documented a potassium of 3.0.  The patient had a normal Pap smear.  An endometrial biopsy documented a benign endometrium, and preoperative urodynamic testing confirmed the presence of genuine stress incontinence.  HOSPITAL COURSE:  The patient was admitted on July 05, 2000, at which time she underwent a total abdominal hysterectomy with bilateral salpingo-oophorectomy, lysis of adhesions, abdominal Burch procedure, cystoscopy, suprapubic catheter placement, and posterior colporrhaphy performed under general anesthesia.  The surgery was uncomplicated.  An incidental finding at the time of surgery documented a globular mass which was palpable in the upper abdomen thought to be close to the pancreas or pylorus of the stomach and potentially retroperitoneal in nature.  Following this, the decision was made to proceed with an abdominal CT scan postoperatively as the potential mass was in an area difficult to reach  through the Pfannenstiel incision without potentially creating and incision to the xiphoid process.  Postoperatively, the patients recovery was significant  for a temperature to 101.2 degrees Fahrenheit on July 06, 2000.  The patient did have blood cultures, a urinalysis, and a CBC performed.  The blood culture results ultimately returned negative, and the urinalysis documented rbcs consistent with the patients prior surgery.  The white blood cell count was noted to 9.6 with 75% neutrophils.  The patients fever was thought to be most likely due to atelectasis, but prophylactic antibiotics with cefotetan 2 g IV q.12h. was administered as the patient was felt to be at high risk for developing and infection based on her diabetes and the long abdominal and vaginal surgery. The patients fever did defervesce after approximately 24 hours of antibiotic therapy.  The patient did have a postoperative CT scan performed on July 08, 2000. There was no evidence of any abdominal mass noted.  The patient did have changes in the abdominal wall consistent with her surgery.  There was a 3 cm noncalcified nodule at the base of the left lung which was thought to be consistent with a granuloma.  The remainder of the patients postoperative course was unremarkable.  CONDITION AT DISCHARGE:  The patient is discharged to home in good condition on July 08, 2000.  DISCHARGE MEDICATIONS:  Instructions at discharge will include the following medications:  1. Percocet one to two p.o. q.4-6h. p.r.n. pain.  2. Motrin 600 mg p.o. q.6h. p.r.n. pain.  3. Premarin 0.625 mg p.o. q.d.  4. Norvasec 500 mg p.o. q.d.  5. Glucophage 500 mg p.o. b.i.d.  6. Glyburide 3 mg one-half tablet p.o. q.d.  7. Indapamide 2.5 mg p.o. q.d.  8. Iron sulfate 325 mg p.o. q.d.  9. Centrum one p.o. q.d. 10. Potassium replacement 20 mEq p.o. q.d.  DISCHARGE INSTRUCTIONS:  The patients activity will be diminished for the next six weeks.  The patient will place nothing in the vagina for six weeks. The patient was told not to do any strenuous lifting over 10 pounds nor heavy physical  activity for three months.  The patient will not drive for two weeks.  Diet:  The patient will follow her 2000-calorie ADA diet.   Wound care:  The patient will clean her incision with hydrogen peroxide b.i.d.   SPECIAL INSTRUCTIONS:  The patient will continue with her bladder training during the day and record all volumes.  She will hook the suprapubic catheter up to the Foley bag for gravity during each night.  FOLLOWUP:  The patient will follow up in the office on July 10, 2000, for staple removal and potential removal of the suprapubic catheter.  The patient will call if she experiences any fever, pain uncontrolled by her medications, vaginal bleeding like a menstrual period, drainage from her incision, painful urination, or any other concern. DD:  07/08/00 TD:  07/10/00 Job: [id number]

## 2011-02-18 NOTE — Op Note (Signed)
[facility]  Patient:    [name], [name]                      MRN: [id number] Proc. Date: 07/05/00 Attending:  Dietrich Pates A. Quincy Simmonds, M.D.                           Operative Report  PREOPERATIVE DIAGNOSES: 1. Menorrhagia with uterine fibroid. 2. Mixed incontinence. 3. Pelvic organ prolapse. 4. Left to mid abdominal pain.  POSTOPERATIVE DIAGNOSIS: 1. Menorrhagia with uterine fibroid. 2. Abdominal mass. 3. Abdominal and pelvic adhesions.  OPERATION: 1. Examination under anesthesia. 2. Total abdominal hysterectomy. 3. Bilateral salpingo-oophorectomy. 4. Lysis of adhesions. 5. Abdominal Burch procedure. 6. Cystoscopy. 7. Suprapubic catheter placement. 8. Posterior colporrhaphy.  SURGEON:  Brook A. Quincy Simmonds, M.D.  ASSISTANT:   Gus Height, M.D.  ANESTHESIA:  General endotracheal.  FLUIDS:  150 cc Ringers lactate.  Estimated blood loss 250 cc.  Urine output 400 cc.  COMPLICATIONS:  None.  INDICATIONS:  The patient is a 60 year old, gravida 3, para 3, African-American female, status post bilateral tubal ligation, who has a 78-month history of heavy menses and abdominal discomfort.  On pelvic ultrasound, the patient was noted to have a 2.8 cm anterior fundal fibroid in an otherwise normal pelvic ultrasound.  A preoperative endometrial biopsy was benign.  The patient had left-sided abdominal pain which had been waxing and waning in nature over seven months.  The patient also had a history of urge incontinence and leakage with coughing and sneezing, and preoperative urodynamic testing confirmed the presence of genuine stress incontinence.  The patient, on physical examination, was noted to have a second degree rectocele. The patient wished for no future childbearing, and she desired definitive treatment of her abnormal vaginal bleeding, urinary incontinence, and pelvic organ prolapse.  The patient chose to proceed with surgery after the  risks, benefits, and alternatives were discussed with her.  FINDINGS:  Examination under anesthesia revealed a small, anteverted, mobile uterus.  No adnexal masses were appreciated.  There was evidence of a first degree cystocele and a second degree rectocele.  At the time of laparotomy, the patient was noted to have a normal uterus, tubes, and ovaries bilaterally.  There was a 1 cm simple right ovarian cyst. There were adhesions noted between the right fallopian tube an the omentum and also a band of thin adhesions between the anterior abdominal wall and the omentum.  All adhesions were lysed.  Exploration of the upper abdomen demonstrated a smooth liver.  There was a firm globular mass measuring approximately 3 x 4 cm in the area of the duodenum and pancreas in the midline.  As this mass was high in the abdomen and appeared to be potentially retroperitoneal in nature, visible access was not obtained.  The patient had a normal right kidney, and, on the left kidney, there was an area that was probably a 1.5 cm cystic area on the inferior portion of the left kidney. There was no evidence of any excrescences along the peritoneal surfaces.  Cystoscopy demonstrated the absence of sutures in the ureters and in the bladder.  The bladder was visualized throughout 360 degrees.  There was some irritation noted at the top of the urethra which was attributed to the Foley catheter placement at the beginning of the procedure.  The trigone was unremarkable.  Both of the ureters were noted to be patent bilaterally.  SPECIMENS:  The  uterus, cervix, tubes, and ovaries were all sent to pathology. The cervix was sent as a separate specimen.  DESCRIPTION OF PROCEDURE:  With an IV placed, the patient was escorted to the operating suite after she was properly identified.  The patient received general endotracheal anesthesia, and she was then placed in the dorsolithotomy position.  The abdomen and vagina  were sterilely prepped and draped, and a 30 cc Foley catheter was sterilely placed inside the urinary bladder.  An examination under anesthesia was performed, and the findings are as noted above.  The procedure began with a Pfannenstiel incision which was created sharply with the scalpel.  This was carried down to the fascia using monopolar cautery.  The fascia was scored in the midline with a scalpel, and the incision was carried out bilaterally using Mayo scissors.  The rectus muscles were separated from the overlying fascia using sharp dissection both superiorly and inferiorly.  The parietal peritoneum was ultimately entered bluntly.  The incision was extended both superiorly and inferiorly using sharp dissection with the Metzenbaum scissors.  An exploration of the abdomen and pelvis was performed at this time, and the findings are as noted above.  The omental adhesions on the anterior abdominal wall were clamped with two Kelly clamps, divided, and ligated with a free tie of 2-0 Vicryl.  The same procedure was performed with the adhesion between the right fallopian tube and the omentum.  Hemostasis was excellent at both operative sites.  A retractor was then placed in the abdomen to give exposure of the pelvis, and the bowel was packed out of the surgical field with moistened laparotomy pads.  The hysterectomy began by placing long Kelly clamps across the adnexal structures bilaterally.  The round ligaments were then isolated, suture ligated with 0 Vicryl, and divided using monopolar cautery.  The broad ligaments were opened using monopolar cautery, and the infundibular pelvic ligaments were clamped with Heaney clamps, divided, and ligated with a free tie of 0 Vicryl followed by suture ligature of the same.  Hemostasis was excellent bilaterally after this was performed.  The bladder flap was then created using sharp dissection with a Metzenbaum scissors.  The uterine arteries were  skeletonized bilaterally, clamped with a Heaney clamp, divided sharply with Mayo scissors, and then suture ligated with 0 Vicryl for  excellent hemostasis.  The uterosacral ligaments were then sequentially clamped with Heaney clamps, divided with a scalpel, then suture ligated with transfixing sutures of 0 Vicryl.   At this time, a supracervical hysterectomy was performed sharply with a scalpel, and the utherus was set aside.  With te uterine fundus free of the surgical field, a curved Heaney clamp was then placed across the top of the vagina on the patients left-hand side, and the vagina was entered sharply with the Mayo scissors.  A suture ligature was performed on this side in a transfixing nature, and the remainder of the cervix was removed using sharp dissection with the scissors.  The cervix was then sent to pathology along with the uterus, tubes, and ovaries.  Modified Richardson angled sutures were performed bilaterally using 0 Vicryl.  The vagina cuff was then closed by placing figure-of-eight sutures of 0 Vicryl.  The pelvis was then irrigated with warm crystalloid solution and suction. There was a small amount of bleeding noted along the posterior vaginal cuff which responded to coagulation with monopolar cautery.  All of the operative sites were examined, and hemostasis was noted to be excellent.  The retractor and  the packing was, therefore, removed from the abdomen at this time.  The parietal peritoneum was closed with a running suture of 2-0 Vicryl.  At this time, and abdominal Burch procedure was performed.  The rectus muscles were separated from their tendinous insertions along the pubic synthesis bilaterally such that a Cherney incision was performed.  Using blunt dissection, the retropubic space was dissected bilaterally along either side of the urethra such that the arcus tendineus fascia pelvis was clearly identified.  At this time, a hand was placed inside the  vagina, and the paravaginal tissue was elevated and a figure-of-eight suture of 0 Ethibond placed to the left of the urethra along the mid portion of the urethra and lateral to it.  The suture was tied at this point to assist in hemostasis at the suturing site.  A second suture was placed lateral to this and slightly superior to be located at the ureterovesical junction.  Again, this was a figure-of-eight suture placed in the paravaginal tissue.  Again, this suture was tied to assist in hemostasis of this region.  The same procedure that was performed on the patients left-hand side was then repeated on the right-hand side in the paravaginal tissue in this location.  Next, each arm of the double arm suture was brought up through Coopers ligament on the ipsilateral side.  Using elevation of the vaginal hand from below, the suture ligatures were tied on top of Coopers ligaments for excellent elevation of the urethra and paravaginal tissue.  The operative site at this time was examined and suctioned of any remaining fluid, and hemostasis was noted to be excellent.  Cystoscopy was performed at this time, and the findings are as noted above.  A suprapubic catheter was placed under visualization of the cystoscope. The suprapubic tube was secured to the skin using 2-0 Prolene.  The rectus muscles were then reinserted on the tentative insertion site using figure-of-eight sutures of 0 Vicryl bilaterally.  The rectus muscles were then brought together in the bend line by placing two figure-of-eight sutures of 0 Vicryl.  The fascia was then closed with a running suture of 0 Vicryl.  The subcutaneous tissue was irrigated and suctioned of remaining fluid. Hemostasis was excellent.  Interrupted sutures of 3-0 plain were placed in the subcutaneous layer; the skin was closed with staples.  A sterile bandage was then placed over the incision and around the suprapubic catheter.  With the Foley catheter  replaced inside the patients urinary bladder, attention was then turned to the vagina where the posterior colporrhaphy was performed.  Allis clamps were used to mark the posterior aspect of the hymenal ring, and they were placed in the midline of the vagina up to the top of the level of the rectocele.  The subvaginal tissue was injected with 1% lidocaine with 1:100,000 epinephrine, and a total of 3 cc was used.  A transverse incision was then created in the vaginal tissue just below the level of the hymen.  A vertical vaginal midline incision was then created sharply with the Mayo scissors to the top of the rectocele.  The rectum was dissected off of the overlying vaginal tissue using sharp dissection with the Mayo scissors. This was performed bilaterally.  Hemostasis was created of small bleeding vessels in the perirectal fat using monopolar cautery.  The perirectal fascia was then brought together using horizontal mattress sutures of 0 Vicryl to provide good rectal support.  The excess vaginal tissue was then trimmed away, and the  vagina was closed with a running locked suture of 2-0 Vicryl.  A rectal examination confirmed the absence of sutures in the rectum.  Iodoform gauze packing was then placed in the vagina.  The suprapubic catheter and the Foley catheter were then connected to drainage bags.  The patient was cleansed of any remaining Betadine, and she was taken out of the dorsolithotomy position.  The patient was then awakened, extubated, and escorted to the recovery room in stable condition.  There were no complications to the procedure.  Needle, instrument, and sponge counts were correct. DD:  07/05/00 TD:  07/06/00 Job: [id number]

## 2011-02-18 NOTE — Discharge Summary (Signed)
[name], [name]                  ACCOUNT NO.:  [id number]   MEDICAL RECORD NO.:  [id number]          PATIENT TYPE:  INP   LOCATION:  2925                         FACILITY:  [facility]   PHYSICIAN:  Neysa Bonito, MD  DATE OF BIRTH:  07/28/1951   DATE OF ADMISSION:  08/02/2006  DATE OF DISCHARGE:                                 DISCHARGE SUMMARY   ADDENDUM   Patient was complaining of left breast soreness.  Ultrasound cannot be done  at this admission.  We have instructed by the ultrasound department that  they cannot do an ultrasound here and the patient should be referred to the  ultrasound and mammogram as an outpatient center.  Would refer the patient  and there she will discuss with the patient.  We are going to add Glucophage  100 mg b.i.d. to her discharge medication.      Neysa Bonito, MD  Electronically Signed     EME/MEDQ  D:  08/04/2006  T:  08/05/2006  Job:  [id number]

## 2011-03-16 ENCOUNTER — Other Ambulatory Visit: Payer: Self-pay | Admitting: Internal Medicine

## 2011-03-16 ENCOUNTER — Ambulatory Visit (INDEPENDENT_AMBULATORY_CARE_PROVIDER_SITE_OTHER): Payer: Self-pay | Admitting: *Deleted

## 2011-03-16 DIAGNOSIS — I428 Other cardiomyopathies: Secondary | ICD-10-CM

## 2011-03-20 ENCOUNTER — Inpatient Hospital Stay (HOSPITAL_COMMUNITY)
Admission: EM | Admit: 2011-03-20 | Discharge: 2011-03-24 | DRG: 190 | Disposition: A | Discharge status: Home / Self Care | Payer: Medicare Other | Attending: Internal Medicine | Admitting: Internal Medicine

## 2011-03-20 ENCOUNTER — Emergency Department (HOSPITAL_COMMUNITY): Payer: Medicare Other

## 2011-03-20 DIAGNOSIS — Z794 Long term (current) use of insulin: Secondary | ICD-10-CM

## 2011-03-20 DIAGNOSIS — Z79899 Other long term (current) drug therapy: Secondary | ICD-10-CM

## 2011-03-20 DIAGNOSIS — J9600 Acute respiratory failure, unspecified whether with hypoxia or hypercapnia: Secondary | ICD-10-CM | POA: Diagnosis present

## 2011-03-20 DIAGNOSIS — I509 Heart failure, unspecified: Secondary | ICD-10-CM | POA: Diagnosis present

## 2011-03-20 DIAGNOSIS — E785 Hyperlipidemia, unspecified: Secondary | ICD-10-CM | POA: Diagnosis present

## 2011-03-20 DIAGNOSIS — I5022 Chronic systolic (congestive) heart failure: Secondary | ICD-10-CM | POA: Diagnosis present

## 2011-03-20 DIAGNOSIS — I428 Other cardiomyopathies: Secondary | ICD-10-CM | POA: Diagnosis present

## 2011-03-20 DIAGNOSIS — Z7982 Long term (current) use of aspirin: Secondary | ICD-10-CM

## 2011-03-20 DIAGNOSIS — J440 Chronic obstructive pulmonary disease with acute lower respiratory infection: Principal | ICD-10-CM | POA: Diagnosis present

## 2011-03-20 DIAGNOSIS — R0902 Hypoxemia: Secondary | ICD-10-CM | POA: Diagnosis present

## 2011-03-20 DIAGNOSIS — E7800 Pure hypercholesterolemia, unspecified: Secondary | ICD-10-CM | POA: Diagnosis present

## 2011-03-20 DIAGNOSIS — IMO0001 Reserved for inherently not codable concepts without codable children: Secondary | ICD-10-CM | POA: Diagnosis present

## 2011-03-20 DIAGNOSIS — J209 Acute bronchitis, unspecified: Principal | ICD-10-CM | POA: Diagnosis present

## 2011-03-20 DIAGNOSIS — IMO0002 Reserved for concepts with insufficient information to code with codable children: Secondary | ICD-10-CM

## 2011-03-20 DIAGNOSIS — F17200 Nicotine dependence, unspecified, uncomplicated: Secondary | ICD-10-CM | POA: Diagnosis present

## 2011-03-20 DIAGNOSIS — I10 Essential (primary) hypertension: Secondary | ICD-10-CM | POA: Diagnosis present

## 2011-03-20 DIAGNOSIS — E875 Hyperkalemia: Secondary | ICD-10-CM | POA: Diagnosis present

## 2011-03-20 DIAGNOSIS — Z95810 Presence of automatic (implantable) cardiac defibrillator: Secondary | ICD-10-CM

## 2011-03-20 LAB — DIFFERENTIAL
Basophils Absolute: 0 10*3/uL (ref 0.0–0.1)
Basophils Relative: 0 % (ref 0–1)
Eosinophils Absolute: 0.3 10*3/uL (ref 0.0–0.7)
Eosinophils Relative: 3 % (ref 0–5)
Lymphocytes Relative: 33 % (ref 12–46)
Lymphs Abs: 3.2 10*3/uL (ref 0.7–4.0)
Monocytes Absolute: 0.6 10*3/uL (ref 0.1–1.0)
Monocytes Relative: 6 % (ref 3–12)
Neutro Abs: 5.8 10*3/uL (ref 1.7–7.7)
Neutrophils Relative %: 58 % (ref 43–77)

## 2011-03-20 LAB — POCT I-STAT, CHEM 8
BUN: 13 mg/dL (ref 6–23)
Calcium, Ion: 1.07 mmol/L — ABNORMAL LOW (ref 1.12–1.32)
Chloride: 107 mEq/L (ref 96–112)
Creatinine, Ser: 0.8 mg/dL (ref 0.50–1.10)
Glucose, Bld: 173 mg/dL — ABNORMAL HIGH (ref 70–99)
HCT: 41 % (ref 36.0–46.0)
Hemoglobin: 13.9 g/dL (ref 12.0–15.0)
Potassium: 3.9 mEq/L (ref 3.5–5.1)
Sodium: 140 mEq/L (ref 135–145)
TCO2: 24 mmol/L (ref 0–100)

## 2011-03-20 LAB — PRO B NATRIURETIC PEPTIDE: Pro B Natriuretic peptide (BNP): 139.3 pg/mL — ABNORMAL HIGH (ref 0–125)

## 2011-03-20 LAB — CBC
HCT: 38.5 % (ref 36.0–46.0)
Hemoglobin: 12.8 g/dL (ref 12.0–15.0)
MCH: 26.3 pg (ref 26.0–34.0)
MCHC: 33.2 g/dL (ref 30.0–36.0)
MCV: 79.1 fL (ref 78.0–100.0)
Platelets: 242 10*3/uL (ref 150–400)
RBC: 4.87 MIL/uL (ref 3.87–5.11)
RDW: 15.1 % (ref 11.5–15.5)
WBC: 10 10*3/uL (ref 4.0–10.5)

## 2011-03-20 LAB — POCT I-STAT 3, ART BLOOD GAS (G3+)
Acid-Base Excess: 1 mmol/L (ref 0.0–2.0)
Bicarbonate: 27 mEq/L — ABNORMAL HIGH (ref 20.0–24.0)
O2 Saturation: 93 %
Patient temperature: 98
TCO2: 28 mmol/L (ref 0–100)
pCO2 arterial: 46.4 mmHg — ABNORMAL HIGH (ref 35.0–45.0)
pH, Arterial: 7.372 (ref 7.350–7.400)
pO2, Arterial: 70 mmHg — ABNORMAL LOW (ref 80.0–100.0)

## 2011-03-20 LAB — PROTIME-INR
INR: 0.98 (ref 0.00–1.49)
Prothrombin Time: 13.2 seconds (ref 11.6–15.2)

## 2011-03-20 LAB — D-DIMER, QUANTITATIVE: D-Dimer, Quant: 0.39 ug/mL-FEU (ref 0.00–0.48)

## 2011-03-20 LAB — APTT: aPTT: 34 seconds (ref 24–37)

## 2011-03-21 DIAGNOSIS — I359 Nonrheumatic aortic valve disorder, unspecified: Secondary | ICD-10-CM

## 2011-03-21 LAB — CARDIAC PANEL(CRET KIN+CKTOT+MB+TROPI)
CK, MB: 3.9 ng/mL (ref 0.3–4.0)
CK, MB: 3.7 ng/mL (ref 0.3–4.0)
CK, MB: 3.5 ng/mL (ref 0.3–4.0)
Relative Index: 1.7 (ref 0.0–2.5)
Relative Index: 1.9 (ref 0.0–2.5)
Relative Index: 1.6 (ref 0.0–2.5)
Total CK: 236 U/L — ABNORMAL HIGH (ref 7–177)
Total CK: 195 U/L — ABNORMAL HIGH (ref 7–177)
Total CK: 219 U/L — ABNORMAL HIGH (ref 7–177)
Troponin I: <0.3 ng/mL (ref <0.30)
Troponin I: <0.3 ng/mL (ref <0.30)
Troponin I: <0.3 ng/mL (ref <0.30)

## 2011-03-21 LAB — HEMOGLOBIN A1C
Hgb A1c MFr Bld: 10.2 % — ABNORMAL HIGH (ref <5.7)
Mean Plasma Glucose: 246 mg/dL — ABNORMAL HIGH (ref <117)

## 2011-03-21 LAB — GLUCOSE, CAPILLARY
Glucose-Capillary: 208 mg/dL — ABNORMAL HIGH (ref 70–99)
Glucose-Capillary: 326 mg/dL — ABNORMAL HIGH (ref 70–99)
Glucose-Capillary: 396 mg/dL — ABNORMAL HIGH (ref 70–99)
Glucose-Capillary: 411 mg/dL — ABNORMAL HIGH (ref 70–99)

## 2011-03-21 LAB — BASIC METABOLIC PANEL
BUN: 16 mg/dL (ref 6–23)
CO2: 22 mEq/L (ref 19–32)
Calcium: 8.9 mg/dL (ref 8.4–10.5)
Chloride: 95 mEq/L — ABNORMAL LOW (ref 96–112)
Creatinine, Ser: 0.82 mg/dL (ref 0.50–1.10)
GFR calc Af Amer: >60 mL/min (ref >60)
GFR calc non Af Amer: >60 mL/min (ref >60)
Glucose, Bld: 537 mg/dL — ABNORMAL HIGH (ref 70–99)
Potassium: 4.2 mEq/L (ref 3.5–5.1)
Sodium: 134 mEq/L — ABNORMAL LOW (ref 135–145)

## 2011-03-21 LAB — COMPREHENSIVE METABOLIC PANEL
ALT: 22 U/L (ref 0–35)
AST: 18 U/L (ref 0–37)
Albumin: 4 g/dL (ref 3.5–5.2)
Alkaline Phosphatase: 129 U/L — ABNORMAL HIGH (ref 39–117)
BUN: 13 mg/dL (ref 6–23)
CO2: 29 mEq/L (ref 19–32)
Calcium: 9.2 mg/dL (ref 8.4–10.5)
Chloride: 99 mEq/L (ref 96–112)
Creatinine, Ser: 0.8 mg/dL (ref 0.50–1.10)
GFR calc Af Amer: >60 mL/min (ref >60)
GFR calc non Af Amer: >60 mL/min (ref >60)
Glucose, Bld: 232 mg/dL — ABNORMAL HIGH (ref 70–99)
Potassium: 3.3 mEq/L — ABNORMAL LOW (ref 3.5–5.1)
Sodium: 139 mEq/L (ref 135–145)
Total Bilirubin: 0.3 mg/dL (ref 0.3–1.2)
Total Protein: 7.9 g/dL (ref 6.0–8.3)

## 2011-03-21 LAB — TSH: TSH: 0.725 u[IU]/mL (ref 0.350–4.500)

## 2011-03-21 LAB — MRSA PCR SCREENING: MRSA by PCR: NEGATIVE

## 2011-03-21 LAB — MAGNESIUM: Magnesium: 1.8 mg/dL (ref 1.5–2.5)

## 2011-03-22 LAB — GLUCOSE, CAPILLARY
Glucose-Capillary: 244 mg/dL — ABNORMAL HIGH (ref 70–99)
Glucose-Capillary: 188 mg/dL — ABNORMAL HIGH (ref 70–99)
Glucose-Capillary: 263 mg/dL — ABNORMAL HIGH (ref 70–99)
Glucose-Capillary: 155 mg/dL — ABNORMAL HIGH (ref 70–99)

## 2011-03-22 LAB — BASIC METABOLIC PANEL
BUN: 32 mg/dL — ABNORMAL HIGH (ref 6–23)
CO2: 30 mEq/L (ref 19–32)
Calcium: 9.6 mg/dL (ref 8.4–10.5)
Chloride: 100 mEq/L (ref 96–112)
Creatinine, Ser: 0.9 mg/dL (ref 0.50–1.10)
GFR calc Af Amer: >60 mL/min (ref >60)
GFR calc non Af Amer: >60 mL/min (ref >60)
Glucose, Bld: 193 mg/dL — ABNORMAL HIGH (ref 70–99)
Potassium: 4.2 mEq/L (ref 3.5–5.1)
Sodium: 138 mEq/L (ref 135–145)

## 2011-03-22 LAB — HEMOGLOBIN A1C
Hgb A1c MFr Bld: 9.9 % — ABNORMAL HIGH (ref <5.7)
Mean Plasma Glucose: 237 mg/dL — ABNORMAL HIGH (ref <117)

## 2011-03-23 LAB — GLUCOSE, CAPILLARY
Glucose-Capillary: 250 mg/dL — ABNORMAL HIGH (ref 70–99)
Glucose-Capillary: 101 mg/dL — ABNORMAL HIGH (ref 70–99)
Glucose-Capillary: 156 mg/dL — ABNORMAL HIGH (ref 70–99)
Glucose-Capillary: 130 mg/dL — ABNORMAL HIGH (ref 70–99)
Glucose-Capillary: 275 mg/dL — ABNORMAL HIGH (ref 70–99)

## 2011-03-24 LAB — BASIC METABOLIC PANEL
BUN: 39 mg/dL — ABNORMAL HIGH (ref 6–23)
BUN: 39 mg/dL — ABNORMAL HIGH (ref 6–23)
CO2: 25 mEq/L (ref 19–32)
CO2: 26 mEq/L (ref 19–32)
Calcium: 9.6 mg/dL (ref 8.4–10.5)
Calcium: 9.9 mg/dL (ref 8.4–10.5)
Chloride: 96 mEq/L (ref 96–112)
Chloride: 95 mEq/L — ABNORMAL LOW (ref 96–112)
Creatinine, Ser: 0.97 mg/dL (ref 0.50–1.10)
Creatinine, Ser: 0.94 mg/dL (ref 0.50–1.10)
GFR calc Af Amer: >60 mL/min (ref >60)
GFR calc Af Amer: >60 mL/min (ref >60)
GFR calc non Af Amer: 59 mL/min — ABNORMAL LOW (ref >60)
GFR calc non Af Amer: >60 mL/min (ref >60)
Glucose, Bld: 344 mg/dL — ABNORMAL HIGH (ref 70–99)
Glucose, Bld: 349 mg/dL — ABNORMAL HIGH (ref 70–99)
Potassium: 6 mEq/L — ABNORMAL HIGH (ref 3.5–5.1)
Potassium: 4.9 mEq/L (ref 3.5–5.1)
Sodium: 132 mEq/L — ABNORMAL LOW (ref 135–145)
Sodium: 133 mEq/L — ABNORMAL LOW (ref 135–145)

## 2011-03-24 LAB — GLUCOSE, CAPILLARY
Glucose-Capillary: 259 mg/dL — ABNORMAL HIGH (ref 70–99)
Glucose-Capillary: 324 mg/dL — ABNORMAL HIGH (ref 70–99)
Glucose-Capillary: 322 mg/dL — ABNORMAL HIGH (ref 70–99)

## 2011-04-02 NOTE — H&P (Signed)
NAMEBEVELYN, ALBER                  ACCOUNT NO.:  [id number]  MEDICAL RECORD NO.:  [id number]  LOCATION:  [facility]                         FACILITY:  [facility]  PHYSICIAN:  Orvan Falconer, MD           DATE OF BIRTH:  06/16/1951  DATE OF ADMISSION:  03/20/2011 DATE OF DISCHARGE:                             HISTORY & PHYSICAL   PRIMARY CARE PHYSICIAN:  [facility].  CARDIOLOGIST:  Champ Mungo. Lovena Uilani Sanville, MD of [place].  REASON FOR ADMISSION:  Respiratory distress.  ADVANCE DIRECTIVE:  Full code.  HISTORY OF PRESENT ILLNESS:  This is a 60 year old female with history of nonischemic cardiomyopathy, severe congestive heart failure with ejection fraction of 33% even 2 years ago, status post IVCD, diabetes, hyperlipidemia, hypertension, tobacco abuse, presents with severe shortness of breath for the past 2-3 days with clear sputum production. She denied any fever or chills, distant travel or any ill contact.  She admits to having some pedal swelling and some orthopnea.  Denies any chest pain.  Evaluation in the emergency room showed that she was quite hypertensive with systolic blood pressure of 0000000, diastolic of [id number], dyspnea and tachypnea with tachycardia, heart rate of 120.  Her BNP is slightly elevated at 140.  EKG shows sinus tachycardia without any acute ST-T changes.  Her chest x-ray showed no infiltrate nor congestion.  She has a normal white count of 10,000 and a creatinine of 0.8.  She was given nebulizer treatments which she said that was not really helpful, and hospitalist was asked to admit the patient for COPD exacerbation.  PAST MEDICAL HISTORY:  Congestive heart failure, hypertension, hypercholesterolemia, diabetes, ICD and COPD.  PAST SURGICAL HISTORY:  Status post hysterectomy and implantation of AICD.  ALLERGY:  To HYDROCORTISONE and ACTOS.  CURRENT MEDICATIONS:  List unclear, however, her old record a couple of years ago included: 1. Lisinopril 40 mg per day. 2. Neurontin. 3.  Celexa. 4. Crestor. 5. Aldactone. 6. Lasix 40 mg per day. 7. Toprol 200 mg per day. 8. Insulin.  REVIEW OF SYSTEMS:  Otherwise unremarkable.  SOCIAL HISTORY:  She is a smoker, but denies alcohol or drug use.  PHYSICAL EXAMINATION:  VITAL SIGNS:  Blood pressure 190/100, temp of 98.2, pulse of 90, respiratory rate of 20. GENERAL:  She does look quite short of breath and she is having tachypnea.  She has incessant cough with clear sputum.  No sinus tenderness. NECK:  Supple.  She has no stridor. CARDIAC:  S1 and S2, tachycardia without any gallop. LUNGS:  Very tight wheezing bilaterally with scattered rhonchi, a little bit of crackle at the very low base. ABDOMEN:  She is obese.  Abdominal exam is soft, nondistended and nontender. EXTREMITIES:  Edema 2+ and no calf tenderness. SKIN:  Warm and dry and she has good distal pulses bilaterally.  OBJECTIVE FINDINGS:  EKG showed normal sinus rhythm without any acute ST- T changes.  White count of 10,000, hemoglobin of 12.8.  Chest x-ray is negative.  BNP of 140, potassium of 3.9, creatinine 0.80, hemoglobin of 12.8, platelet count of 242,000.  IMPRESSION:  This is a 60 year old female with severe congestive heart failure, ejection  fraction of 30% even in 2010, with IVCD placement,  presents with shortness of breath, wheezing, but with clear chest x-ray and slightly elevated BNP with hypertension.  I am concerned about diastolic heart failure or acute systolic heart failure.  She is quite short of breath and having wheezing diffusely.  We will admit her to the Intensive Care Unit.  We have started her on IV nitroglycerin, rule out with serial CPKs and troponin's.  We will get an echo.  She will be given nebulizers, but I am not convinced that this is her chronic obstructive pulmonary disease acting up.  She is a full code and will be admitted to Team 8.  I will give 80 mg of IV Lasix and continue q.12 h. along with little bit of  morphine.  We will get an arterial blood gas.  I have consulted Cardiology for further recommendation as well.  As soon as her  medications are reconciled, we will continue them also.     Orvan Falconer, MD     PL/MEDQ  D:  03/20/2011  T:  03/20/2011  Job:  [id number]  Electronically Signed by Orvan Falconer  on 04/02/2011 12:58:15 AM

## 2011-04-06 NOTE — Discharge Summary (Signed)
NAMEALEJANDRIA, [name]                  ACCOUNT NO.:  [id number]  MEDICAL RECORD NO.:  [id number]  LOCATION:  2040                         FACILITY:  [facility]  PHYSICIAN:  Bonnielee Haff, MD     DATE OF BIRTH:  December 02, 1950  DATE OF ADMISSION:  03/20/2011 DATE OF DISCHARGE:  03/24/2011                              DISCHARGE SUMMARY   PRIMARY CARE PHYSICIAN:  [facility].  No consultations obtained during this hospitalization.  Imaging studies done include a chest x-ray which showed no active cardiopulmonary disease.  Pertinent labs include white cell count that was normal.  ABG showed a pH of 7.37, pCO2 was 46, pO2 was 70.  D-dimer was normal.  Potassium was 3.3.  HbA1c 10.2.  Cardiac enzymes were negative.  BNP was only 139.3. TSH 0.725.  DISCHARGE DIAGNOSES: 1. Dyspnea, most likely acute bronchitis improved. 2. Chronic obstructive pulmonary disease with current smoking status. 3. Hypoxemia requiring home oxygenation. 4. History of congestive heart failure with improved systolic     function. 5. History of hypertension, stable. 6. Diabetes, poorly controlled.  BRIEF HOSPITAL COURSE: 1. Acute bronchitis/chronic obstructive pulmonary disease.  This is a     60 year old African American female who presented with shortness of     breath.  Her x-ray was clear.  Her BNP was only 139.  The patient     was initially thought to have CHF exacerbation due to her history     of systolic dysfunction, so she was started on IV Lasix.  She was     also given IV nitroglycerin.  She was started on spironolactone as     well.  However, as the days transpired, it became more apparent     that the patient was having bronchitis rather than CHF     exacerbation.  She was given breathing treatments.  She was started     on steroids and her symptoms have significantly improved.  She was     ambulated today and she did well symptomatically.  She had some     coughing however, her oxygenation dropped to  87%.  So she will     require home oxygen. 2. She has a history of systolic CHF.  She has AICD however, we     checked an echocardiogram during this hospitalization and her EF     was measured at 55% so it appears her systolic function has improved.     She did have grade 1 diastolic dysfunction.     The patient was started on BiDil which we have discontinued now.     She was also started as I mentioned on spironolactone which I have     discontinued as well. 3. She had hyperkalemia this morning with Kayexalate that has     corrected to 4.9.  She does have normal renal function. 4. Diabetes.  With the steroids, her blood sugars have creeped up.     With a quick taper, her blood sugars should start coming down soon.     Her dose of Lantus has been increased during this hospitalization.  Rest of her medical issues are all stable.  She has been  advised strongly to quit smoking.  I have also advised her that she needs to tell her husband to quit smoking for at least he needs to go outside and smoke.  Discharge medications are as follows: 1. Albuterol nebulized 2.5 mg every 6 hours as needed. 2. Atrovent 0.5 mg nebulized every 6 hours as needed. 3. Prednisone taper. 4. Lantus insulin 44 units twice daily. 5. Advair Diskus 250/50 inhaled b.i.d. 6. Nexium albuterol inhaler as needed when she is short of breath     outside her home. 7. Aspirin 81 mg daily. 8. Atorvastatin 40 mg every morning. 9. Citalopram 20 mg every morning. 10.Furosemide 40 mg every morning. 11.Gabapentin 300 mg daily at bedtime. 12.Lisinopril 40 mg every morning. 13.Metoprolol XL 200 mg every morning. 14.Naproxen 500 mg twice daily with meals. 15.NovoLog on a sliding scale. 16.Tramadol 50 mg every 6 hours as needed for pain.  DISCHARGE PHYSICAL EXAMINATION:  GENERAL:  On the day of discharge, the patient is feeling well.  She ambulated this morning.  She had some cough post ambulation, but otherwise she is  feeling much better. VITAL SIGNS:  All stable.  She is satting 97% on room air at rest, 87% with ambulation. LUNGS:  Clear to auscultation bilaterally with no wheezing, rales, or rhonchi. CARDIOVASCULAR:  S1 and S2 is normal regular.  No S3, S4, rubs, murmurs, or bruits ABDOMEN:  Soft, nontender, nondistended.  Bowel sounds are present.  No masses or organomegaly is appreciated. NEUROLOGICALLY:  She is alert and oriented x3.  No focal neurological deficits are present.  ASSESSMENT AND PLAN:  As per above.  FOLLOWUP:  With the [facility] in 1 week.  She will also be advised to follow up with her cardiologist, Dr. Lovena Le in 2-3 weeks.  DIET:  Modified carbohydrate medium.  PHYSICAL ACTIVITY:  Increase activity slowly.  TOTAL TIME ON THIS DISCHARGE ENCOUNTER:  35 minutes.  Bonnielee Haff, MD     GK/MEDQ  D:  03/24/2011  T:  03/25/2011  Job:  [id number]  cc:   Champ Mungo. Lovena Le, MD  Electronically Signed by Bonnielee Haff MD on 04/06/2011 [id number] PM

## 2011-04-12 ENCOUNTER — Encounter: Payer: Self-pay | Admitting: Physician Assistant

## 2011-04-13 ENCOUNTER — Ambulatory Visit (INDEPENDENT_AMBULATORY_CARE_PROVIDER_SITE_OTHER): Payer: Self-pay | Admitting: Physician Assistant

## 2011-04-13 ENCOUNTER — Encounter: Payer: Self-pay | Admitting: Physician Assistant

## 2011-04-13 VITALS — BP 119/75 | HR 69 | Ht 60.0 in | Wt 249.8 lb

## 2011-04-13 DIAGNOSIS — J449 Chronic obstructive pulmonary disease, unspecified: Secondary | ICD-10-CM

## 2011-04-13 DIAGNOSIS — I5022 Chronic systolic (congestive) heart failure: Secondary | ICD-10-CM

## 2011-04-13 DIAGNOSIS — F17200 Nicotine dependence, unspecified, uncomplicated: Secondary | ICD-10-CM

## 2011-04-13 DIAGNOSIS — E785 Hyperlipidemia, unspecified: Secondary | ICD-10-CM

## 2011-04-13 DIAGNOSIS — I10 Essential (primary) hypertension: Secondary | ICD-10-CM

## 2011-04-13 LAB — BASIC METABOLIC PANEL
BUN: 24 mg/dL — ABNORMAL HIGH (ref 6–23)
CO2: 28 mEq/L (ref 19–32)
Calcium: 8.9 mg/dL (ref 8.4–10.5)
Chloride: 106 mEq/L (ref 96–112)
Creatinine, Ser: 0.9 mg/dL (ref 0.4–1.2)
GFR: 82.84 mL/min (ref >60.00)
Glucose, Bld: 173 mg/dL — ABNORMAL HIGH (ref 70–99)
Potassium: 4.7 mEq/L (ref 3.5–5.1)
Sodium: 140 mEq/L (ref 135–145)

## 2011-04-13 NOTE — Assessment & Plan Note (Signed)
Controlled.  Continue current therapy.  

## 2011-04-13 NOTE — Assessment & Plan Note (Signed)
Managed by PCP

## 2011-04-13 NOTE — Assessment & Plan Note (Addendum)
LV function improved by echocardiogram during her admission.  The likely cause of her admission was acute COPD flare.  She seems to be improved from this.  She can continue on her oxygen with ambulation and at bedtime.  Otherwise she can follow up with her PCP at [facility].  She is on a good medical regimen for her cardiomyopathy.  She can continue on ACE inhibitor and beta blocker therapy as well as spironolactone.  She did have hyperkalemia in the hospital.  She will have a basic metabolic panel today for follow up.  She can return to see Dr. Stanford Breed in the next 2 months.  She will get a battery for her scale.  She knows to weigh herself daily and to contact us if she develops significant weight gain.

## 2011-04-13 NOTE — Progress Notes (Signed)
History of Present Illness: Primary Cardiologist: Dr. Kirk Ruths Primary Electrophysiologist:  Dr. Cristopher Peru  [name] is a 60 y.o. female who presents for post hospital follow up.  She has a history of nonischemic cardiomyopathy with a previous EF of 123456, chronic systolic congestive heart failure, hypertension, hyperlipidemia and diabetes.  She is status post AICD implantation.  Cardiac catheterization December 2003 demonstrated normal coronary arteries.  Last Myoview study in 2008 demonstrated no ischemia and he is a 33%.  She was recently admitted 6/17-6/21 with dyspnea.  She was originally treated for heart failure exacerbation.  However she did not improve with this.  It was then felt that she was suffering an acute bronchitis flare.  Apparently, BiDil was added to her regimen.  An echocardiogram demonstrated an EF of 55%, mild LVH, grade 1 diastolic dysfunction, mild AI, mild LAE, PASD 42-46, mild pulmonary hypertension.  Given her improved LV function and, what appeared to be acute bronchitis, her hydralazine and nitrates were discontinued.  She returns for follow up.  Of note, she did develop hyperkalemia.  This was treated and her discharge potassium was 4.9.  Of note, her diabetes is uncontrolled with an A1c of 9.9.  She was hypoxemic and placed on home O2.  She is doing okay.  She does have dyspnea on exertion.  She is probably NYHA class IIb.  She sleeps on 2 pillows.  This is chronic without change.  She denies PND.  She has mild pedal edema at times.  She denies syncope.  She denies chest pain.  She denies any therapies from her AICD.  Weight was 245 at d/c, she is 249 today.  Not weighing at home.  Needs to get a battery for her scale.   Past Medical History  Diagnosis Date  . Hyperthyroidism, subclinical   . Post menopausal syndrome   . Other postprocedural status     Hysteroscopy, hx of  . Other screening breast examination   . Acute swimmers' ear   . Sleep apnea   .  Fatigue   . Obesity   . ICD (implantable cardiac defibrillator), biventricular, in situ   . Leg pain   . Eczema   . Depression   . Lipoma   . Family history of seizures   . Family history of diabetes mellitus     1st degree relative  . Chronic ulcer of leg   . Hyperlipidemia   . Systolic congestive heart failure   . Benign essential hypertension   . Cardiomyopathy   . Diabetes mellitus     Current Outpatient Prescriptions  Medication Sig Dispense Refill  . albuterol (VENTOLIN HFA) 108 (90 BASE) MCG/ACT inhaler Inhale 2 puffs into the lungs every 6 (six) hours as needed.        [name] aspirin 81 MG tablet Take 81 mg by mouth daily.        [name] atorvastatin (LIPITOR) 40 MG tablet Take 40 mg by mouth daily.        . citalopram (CELEXA) 20 MG tablet Take 20 mg by mouth daily.        . Fluticasone-Salmeterol (ADVAIR) 250-50 MCG/DOSE AEPB Inhale 1 puff into the lungs every 12 (twelve) hours.        . furosemide (LASIX) 40 MG tablet Take 40 mg by mouth daily.        [name] gabapentin (NEURONTIN) 300 MG capsule Take 300 mg by mouth Nightly.        . insulin aspart (NOVOLOG)  100 UNIT/ML injection Inject into the skin 3 (three) times daily before meals. Sliding scale       . insulin glargine (LANTUS) 100 UNIT/ML injection Inject 44 Units into the skin 2 (two) times daily.       . Insulin Pen Needle (PEN NEEDLES) 31G X 6 MM MISC 2 (two) times daily.        [name] ketoconazole (NIZORAL) 2 % shampoo Apply topically. Apply to scalp three times per week - let lather and sit for 5 minutes and then rinse.       . lisinopril (PRINIVIL,ZESTRIL) 40 MG tablet Take 40 mg by mouth daily.        . metoprolol (TOPROL XL) 200 MG 24 hr tablet Take 200 mg by mouth daily.        . naproxen (NAPROSYN) 500 MG tablet Take 500 mg by mouth 2 (two) times daily with a meal.        . NON FORMULARY 2 L. Oxygen; continuous       . spironolactone (ALDACTONE) 25 MG tablet Take 25 mg by mouth daily.        . traMADol (ULTRAM) 50 MG tablet  Take 50 mg by mouth every 6 (six) hours as needed.        . triamcinolone (KENALOG) 0.1 % ointment Apply topically as needed.          Allergies: Allergies  Allergen Reactions  . Avandia (Rosiglitazone Maleate)   . Hydrocortisone   . Pioglitazone     REACTION: congestive heart failure ACTOS   Social history:  She quit smoking after discharge from the hospital. Vital Signs: BP 119/75  Pulse 69  Ht 5' (1.524 m)  Wt 249 lb 12.8 oz (113.309 kg)  BMI 48.79 kg/m2  PHYSICAL EXAM: Well nourished, well developed, in no acute distress HEENT: normal Neck: no JVD At 45 Cardiac:  Distant heart sounds, normal S1, S2; RRR; no murmur Lungs:  Decreased breath sounds bilaterally, no wheezing, rhonchi or rales Abd: soft, nontender, no hepatomegaly Ext: no edema Skin: warm and dry Neuro:  CNs 2-12 intact, no focal abnormalities noted  EKG:  Sinus rhythm, heart rate 69, normal axis, T wave inversions in one, aVL, V4-V6, no significant change compared to prior tracings, QTc 475  ASSESSMENT AND PLAN:

## 2011-04-13 NOTE — Assessment & Plan Note (Signed)
Continue with her current pulmonary medicines and home O2.  Follow up with her PCP.

## 2011-04-13 NOTE — Assessment & Plan Note (Signed)
I congratulated her on quitting today.

## 2011-04-13 NOTE — Patient Instructions (Signed)
Your physician recommends that you schedule a follow-up appointment in: 06/16/11 @ 10:15 with Dr. Stanford Breed as per as per Richardson Dopp, PA-C  Your physician recommends that you return for lab work in: [place] BMET [phone]

## 2011-04-20 ENCOUNTER — Other Ambulatory Visit (HOSPITAL_COMMUNITY): Payer: Self-pay | Admitting: Family Medicine

## 2011-04-20 DIAGNOSIS — Z1231 Encounter for screening mammogram for malignant neoplasm of breast: Secondary | ICD-10-CM

## 2011-05-23 ENCOUNTER — Ambulatory Visit (HOSPITAL_COMMUNITY)
Admission: RE | Admit: 2011-05-23 | Discharge: 2011-05-23 | Disposition: A | Discharge status: Home / Self Care | Payer: Self-pay | Source: Ambulatory Visit | Attending: Family Medicine | Admitting: Family Medicine

## 2011-05-23 DIAGNOSIS — Z1231 Encounter for screening mammogram for malignant neoplasm of breast: Secondary | ICD-10-CM

## 2011-06-15 ENCOUNTER — Encounter: Payer: Self-pay | Admitting: *Deleted

## 2011-06-15 ENCOUNTER — Ambulatory Visit (INDEPENDENT_AMBULATORY_CARE_PROVIDER_SITE_OTHER): Payer: Self-pay | Admitting: *Deleted

## 2011-06-15 ENCOUNTER — Encounter: Payer: Self-pay | Admitting: Internal Medicine

## 2011-06-15 DIAGNOSIS — I428 Other cardiomyopathies: Secondary | ICD-10-CM

## 2011-06-15 DIAGNOSIS — Z95810 Presence of automatic (implantable) cardiac defibrillator: Secondary | ICD-10-CM

## 2011-06-15 DIAGNOSIS — I5022 Chronic systolic (congestive) heart failure: Secondary | ICD-10-CM

## 2011-06-15 LAB — ICD DEVICE OBSERVATION
BATTERY VOLTAGE: 2.61 V
BRDY-0002RV: 40 {beats}/min
CHARGE TIME: 0 s
DEVICE MODEL ICD: 436280
HV IMPEDENCE: 40 Ohm
RV LEAD AMPLITUDE: 11 mv
RV LEAD IMPEDENCE ICD: 500 Ohm
RV LEAD THRESHOLD: 0.5 V
TOT-0006: 20120613000000
TOT-0007: 3
TOT-0008: 0
TOT-0009: 4
TOT-0010: 22
TZAT-0001SLOWVT: 1
TZAT-0004SLOWVT: 8
TZAT-0012SLOWVT: 200 ms
TZAT-0013SLOWVT: 1
TZAT-0018SLOWVT: NEGATIVE
TZAT-0019SLOWVT: 7.5 V
TZAT-0020SLOWVT: 1 ms
TZON-0003SLOWVT: 300 ms
TZON-0004SLOWVT: 16
TZON-0005SLOWVT: 6
TZST-0001SLOWVT: 2
TZST-0001SLOWVT: 3
TZST-0001SLOWVT: 4
TZST-0001SLOWVT: 5
TZST-0003SLOWVT: 25 J
TZST-0003SLOWVT: 36 J
TZST-0003SLOWVT: 36 J
TZST-0003SLOWVT: 36 J
VENTRICULAR PACING ICD: 1 pct

## 2011-06-15 NOTE — Progress Notes (Signed)
icd check in clinic  

## 2011-06-16 ENCOUNTER — Ambulatory Visit (INDEPENDENT_AMBULATORY_CARE_PROVIDER_SITE_OTHER): Payer: Self-pay | Admitting: Cardiology

## 2011-06-16 ENCOUNTER — Encounter: Payer: Self-pay | Admitting: Cardiology

## 2011-06-16 DIAGNOSIS — Z95810 Presence of automatic (implantable) cardiac defibrillator: Secondary | ICD-10-CM

## 2011-06-16 DIAGNOSIS — J4489 Other specified chronic obstructive pulmonary disease: Secondary | ICD-10-CM

## 2011-06-16 DIAGNOSIS — E785 Hyperlipidemia, unspecified: Secondary | ICD-10-CM

## 2011-06-16 DIAGNOSIS — I10 Essential (primary) hypertension: Secondary | ICD-10-CM

## 2011-06-16 DIAGNOSIS — I428 Other cardiomyopathies: Secondary | ICD-10-CM

## 2011-06-16 DIAGNOSIS — J449 Chronic obstructive pulmonary disease, unspecified: Secondary | ICD-10-CM

## 2011-06-16 NOTE — Assessment & Plan Note (Signed)
I have asked her to followup with primary care for management of her COPD and her home oxygen.

## 2011-06-16 NOTE — Assessment & Plan Note (Signed)
Continue statin. Lipids and liver monitored by primary care. 

## 2011-06-16 NOTE — Patient Instructions (Signed)
Your physician wants you to follow-up in: 6 MONTHS You will receive a reminder letter in the mail two months in advance. If you don't receive a letter, please call our office to schedule the follow-up appointment. 

## 2011-06-16 NOTE — Progress Notes (Signed)
HPI: is a female with history of nonischemic cardiomyopathy, hypertension, hyperlipidemia and diabetes mellitus for followup. Also with previous ICD. Last Myoview was performed in June of 2008. Her ejection fraction was 33%. No ischemia but there was soft tissue attenuation. Echocardiogram in December of 2007 showed an ejection fraction of 35-40%. I have not seen her since 2010. Patient admitted in June of 2012 with question congestive heart failure. Echocardiogram in June of 2012 showed an ejection fraction of 50-55%, mild left atrial enlargement and mild aortic insufficiency. Patient ultimately felt to have bronchitis. She saw Richardson Dopp back in July of 2012. Since then, she does have some dyspnea on exertion but no orthopnea, PND, pedal edema, chest pain or syncope.  Current Outpatient Prescriptions  Medication Sig Dispense Refill  . albuterol (VENTOLIN HFA) 108 (90 BASE) MCG/ACT inhaler Inhale 2 puffs into the lungs every 6 (six) hours as needed.        [name] aspirin 81 MG tablet Take 81 mg by mouth daily.        [name] atorvastatin (LIPITOR) 40 MG tablet Take 40 mg by mouth daily.        . citalopram (CELEXA) 20 MG tablet Take 20 mg by mouth daily.        . Fluticasone-Salmeterol (ADVAIR) 250-50 MCG/DOSE AEPB Inhale 1 puff into the lungs every 12 (twelve) hours.        . furosemide (LASIX) 40 MG tablet Take 40 mg by mouth daily.        [name] gabapentin (NEURONTIN) 300 MG capsule Take 300 mg by mouth Nightly.        . insulin aspart (NOVOLOG) 100 UNIT/ML injection Inject into the skin 3 (three) times daily before meals. Sliding scale       . insulin glargine (LANTUS) 100 UNIT/ML injection Inject 44 Units into the skin 2 (two) times daily.       . Insulin Pen Needle (PEN NEEDLES) 31G X 6 MM MISC 2 (two) times daily.        [name] ketoconazole (NIZORAL) 2 % shampoo Apply topically. Apply to scalp three times per week - let lather and sit for 5 minutes and then rinse.       . lisinopril (PRINIVIL,ZESTRIL) 40 MG  tablet Take 40 mg by mouth daily.        . metoprolol (TOPROL XL) 200 MG 24 hr tablet Take 200 mg by mouth daily.        . naproxen (NAPROSYN) 500 MG tablet Take 500 mg by mouth 2 (two) times daily with a meal.        . NON FORMULARY 2 L. Oxygen; continuous       . spironolactone (ALDACTONE) 25 MG tablet Take 25 mg by mouth daily.        . traMADol (ULTRAM) 50 MG tablet Take 50 mg by mouth every 6 (six) hours as needed.        . triamcinolone (KENALOG) 0.1 % ointment Apply topically as needed.           Past Medical History  Diagnosis Date  . Hyperthyroidism, subclinical   . Post menopausal syndrome   . Sleep apnea   . Obesity   . ICD (implantable cardiac defibrillator), biventricular, in situ   . Eczema   . Depression   . Lipoma   . Chronic ulcer of leg   . Hyperlipidemia   . Systolic congestive heart failure   . Benign essential hypertension   . Cardiomyopathy   .  Diabetes mellitus     Past Surgical History  Procedure Date  . Abdominal hysterectomy   . Tubal ligation   . Cardiac defibrillator placement 05/04/2007    History   Social History  . Marital Status: Married    Spouse Name: N/A    Number of Children: N/A  . Years of Education: N/A   Occupational History  . Not on file.   Social History Main Topics  . Smoking status: Former Smoker    Quit date: 05/04/2007  . Smokeless tobacco: Not on file  . Alcohol Use: No     Quit 05/2007  . Drug Use: No  . Sexually Active: Not on file   Other Topics Concern  . Not on file   Social History Narrative   Married    ROS: no fevers or chills, productive cough, hemoptysis, dysphasia, odynophagia, melena, hematochezia, dysuria, hematuria, rash, seizure activity, orthopnea, PND, pedal edema, claudication. Remaining systems are negative.  Physical Exam: Well-developed well-nourished in no acute distress.  Skin is warm and dry.  HEENT is normal.  Neck is supple. No thyromegaly.  Chest is diminished breath sounds  throughout  Cardiovascular exam is regular rate and rhythm.  Abdominal exam nontender or distended. No masses palpated. Extremities show no edema. neuro grossly intact

## 2011-06-16 NOTE — Assessment & Plan Note (Signed)
Management per Dr. Lovena Le.

## 2011-06-16 NOTE — Assessment & Plan Note (Signed)
Patient LV function her most recent echo. Continue ACE inhibitor, beta blocker and diuretics. Patient now with diastolic congestive heart failure.

## 2011-06-16 NOTE — Assessment & Plan Note (Signed)
Continue present blood pressure medications. 

## 2011-06-27 LAB — CBC
HCT: 36.4
Hemoglobin: 12.2
MCHC: 33.4
MCV: 79.8
Platelets: 306
RBC: 4.56
RDW: 14.3
WBC: 9.8

## 2011-06-27 LAB — BASIC METABOLIC PANEL
BUN: 18
CO2: 30
Calcium: 9.8
Chloride: 107
Creatinine, Ser: 0.8
GFR calc Af Amer: >60
GFR calc non Af Amer: >60
Glucose, Bld: 152 — ABNORMAL HIGH
Potassium: 4.5
Sodium: 143

## 2011-06-27 LAB — URINALYSIS, ROUTINE W REFLEX MICROSCOPIC
Bilirubin Urine: NEGATIVE
Glucose, UA: NEGATIVE
Hgb urine dipstick: NEGATIVE
Ketones, ur: NEGATIVE
Nitrite: NEGATIVE
Protein, ur: NEGATIVE
Specific Gravity, Urine: 1.017
Urobilinogen, UA: 1
pH: 5.5

## 2011-06-27 LAB — DIFFERENTIAL
Basophils Absolute: 0
Basophils Relative: 0
Eosinophils Absolute: 0.5
Eosinophils Relative: 5
Lymphocytes Relative: 28
Lymphs Abs: 2.7
Monocytes Absolute: 0.6
Monocytes Relative: 6
Neutro Abs: 6
Neutrophils Relative %: 61

## 2011-09-29 ENCOUNTER — Ambulatory Visit (INDEPENDENT_AMBULATORY_CARE_PROVIDER_SITE_OTHER): Payer: Medicaid Other | Admitting: Internal Medicine

## 2011-09-29 ENCOUNTER — Encounter: Payer: Self-pay | Admitting: Internal Medicine

## 2011-09-29 ENCOUNTER — Encounter (INDEPENDENT_AMBULATORY_CARE_PROVIDER_SITE_OTHER): Payer: Medicaid Other | Admitting: Internal Medicine

## 2011-09-29 DIAGNOSIS — I5022 Chronic systolic (congestive) heart failure: Secondary | ICD-10-CM

## 2011-09-29 DIAGNOSIS — R0989 Other specified symptoms and signs involving the circulatory and respiratory systems: Secondary | ICD-10-CM

## 2011-09-29 DIAGNOSIS — I428 Other cardiomyopathies: Secondary | ICD-10-CM

## 2011-09-29 LAB — ICD DEVICE OBSERVATION
BATTERY VOLTAGE: 2.59 v
BRDY-0002RV: 40 {beats}/min
CHARGE TIME: 0 s
DEVICE MODEL ICD: 436280
RV LEAD AMPLITUDE: 12.1 mv
RV LEAD IMPEDENCE ICD: 455 Ohm
RV LEAD THRESHOLD: 0.5 v
TOT-0006: 20120912000000
TOT-0007: 3
TOT-0008: 0
TOT-0009: 4
TOT-0010: 26
TZAT-0001SLOWVT: 1
TZAT-0004SLOWVT: 8
TZAT-0012SLOWVT: 200 ms
TZAT-0013SLOWVT: 1
TZAT-0018SLOWVT: NEGATIVE
TZAT-0019SLOWVT: 7.5 v
TZAT-0020SLOWVT: 1 ms
TZON-0003SLOWVT: 300 ms
TZON-0004SLOWVT: 16
TZON-0005SLOWVT: 6
TZST-0001SLOWVT: 2
TZST-0001SLOWVT: 3
TZST-0001SLOWVT: 4
TZST-0001SLOWVT: 5
TZST-0003SLOWVT: 25 j
TZST-0003SLOWVT: 36 j
TZST-0003SLOWVT: 36 j
TZST-0003SLOWVT: 36 j
VENTRICULAR PACING ICD: 0 pct

## 2011-09-29 NOTE — Patient Instructions (Signed)
Your physician recommends that you schedule a follow-up appointment in: 3 months with device clinic   Your physician wants you to follow-up in: 12 months with Dr Knox Saliva will receive a reminder letter in the mail two months in advance. If you don't receive a letter, please call our office to schedule the follow-up appointment.

## 2011-09-29 NOTE — Progress Notes (Signed)
PCP:  MARTIN,NYKEDTRA, NP, NP  The patient presents today for routine electrophysiology followup.  Since last being seen in our clinic, the patient reports doing very well.  She has stable SOB.  Today, she denies symptoms of palpitations, chest pain, dizziness, presyncope, syncope, or ICD shocks.  The patient feels that she is tolerating medications without difficulties and is otherwise without complaint today.   Past Medical History  Diagnosis Date  . Hyperthyroidism, subclinical   . Post menopausal syndrome   . Sleep apnea   . Obesity   . Cardiac defibrillator in situ     Atlas II VR (SJM) implanted by Dr Lovena Le  . Eczema   . Depression   . Lipoma   . Chronic ulcer of leg   . Hyperlipidemia   . Chronic systolic congestive heart failure   . Benign essential hypertension   . Cardiomyopathy     nonischemic  . Diabetes mellitus   . HTN (hypertension)   . COPD (chronic obstructive pulmonary disease)   . Hypercholesteremia   . Fatigue   . Otitis externa, acute     right ear   Past Surgical History  Procedure Date  . Abdominal hysterectomy   . Tubal ligation   . Cardiac defibrillator placement 05/04/2007    SJM Atlas II VR ICD  . Hysteroscopy     Current Outpatient Prescriptions  Medication Sig Dispense Refill  . albuterol (VENTOLIN HFA) 108 (90 BASE) MCG/ACT inhaler Inhale 2 puffs into the lungs every 6 (six) hours as needed.        [name] aspirin 81 MG tablet Take 81 mg by mouth daily.        [name] atorvastatin (LIPITOR) 40 MG tablet Take 40 mg by mouth daily.        . citalopram (CELEXA) 20 MG tablet Take 20 mg by mouth daily.        . Fluticasone-Salmeterol (ADVAIR) 250-50 MCG/DOSE AEPB Inhale 1 puff into the lungs every 12 (twelve) hours.        . furosemide (LASIX) 40 MG tablet Take 40 mg by mouth daily.        [name] gabapentin (NEURONTIN) 300 MG capsule Take 300 mg by mouth Nightly.        . insulin aspart (NOVOLOG) 100 UNIT/ML injection Inject into the skin 3 (three) times daily  before meals. Sliding scale       . insulin glargine (LANTUS) 100 UNIT/ML injection Inject 42 Units into the skin 2 (two) times daily.       . Insulin Pen Needle (PEN NEEDLES) 31G X 6 MM MISC 2 (two) times daily.        [name] ketoconazole (NIZORAL) 2 % shampoo Apply topically. Apply to scalp three times per week - let lather and sit for 5 minutes and then rinse.       . lisinopril (PRINIVIL,ZESTRIL) 40 MG tablet Take 40 mg by mouth daily.        . metoprolol (TOPROL XL) 200 MG 24 hr tablet Take 200 mg by mouth daily.        . naproxen (NAPROSYN) 500 MG tablet Take 500 mg by mouth 2 (two) times daily with a meal.        . NON FORMULARY 2 L. Oxygen; continuous       . spironolactone (ALDACTONE) 25 MG tablet Take 25 mg by mouth daily.        . traMADol (ULTRAM) 50 MG tablet Take 50 mg by mouth 2 (two)  times daily as needed. Take 1-2 tablets      . triamcinolone (KENALOG) 0.1 % ointment Apply topically as needed.          Allergies  Allergen Reactions  . Avandia (Rosiglitazone Maleate)   . Hydrocortisone   . Pioglitazone     REACTION: congestive heart failure ACTOS    History   Social History  . Marital Status: Married    Spouse Name: N/A    Number of Children: N/A  . Years of Education: N/A   Occupational History  . Not on file.   Social History Main Topics  . Smoking status: Former Smoker    Quit date: 05/04/2007  . Smokeless tobacco: Not on file  . Alcohol Use: No     Quit 05/2007  . Drug Use: No  . Sexually Active: Not on file   Other Topics Concern  . Not on file   Social History Narrative   Married    Family History  Problem Relation Age of Onset  . Stroke Mother   . Seizures Father   . Diabetes Sister    Physical Exam: There were no vitals filed for this visit.  GEN- The patient is overweight appearing, alert and oriented x 3 today.   Head- normocephalic, atraumatic Eyes-  Sclera clear, conjunctiva pink Ears- hearing intact Oropharynx- clear Neck- supple,  no JVP Lymph- no cervical lymphadenopathy Lungs- Clear to ausculation bilaterally, normal work of breathing Chest- ICD pocket is well healed Heart- Regular rate and rhythm, no murmurs, rubs or gallops, PMI not laterally displaced GI- soft, NT, ND, + BS Extremities- no clubbing, cyanosis, 1+ edema  ICD interrogation- reviewed in detail today,  See PACEART report  Assessment and Plan:

## 2011-09-29 NOTE — Assessment & Plan Note (Signed)
Stable CHF  Normal ICD function See [facility] report No changes today  Follow-up with Dr Lovena Le in 12 months

## 2011-10-18 ENCOUNTER — Encounter: Payer: Self-pay | Admitting: Pulmonary Disease

## 2011-10-19 ENCOUNTER — Ambulatory Visit (INDEPENDENT_AMBULATORY_CARE_PROVIDER_SITE_OTHER): Payer: Medicare Other | Admitting: Pulmonary Disease

## 2011-10-19 ENCOUNTER — Encounter: Payer: Self-pay | Admitting: Pulmonary Disease

## 2011-10-19 VITALS — BP 144/90 | HR 89 | Temp 97.9°F | Ht 60.0 in | Wt 256.2 lb

## 2011-10-19 DIAGNOSIS — R0609 Other forms of dyspnea: Secondary | ICD-10-CM

## 2011-10-19 DIAGNOSIS — R0989 Other specified symptoms and signs involving the circulatory and respiratory systems: Secondary | ICD-10-CM

## 2011-10-19 DIAGNOSIS — R0600 Dyspnea, unspecified: Secondary | ICD-10-CM

## 2011-10-19 NOTE — Patient Instructions (Signed)
Will schedule for breathing studies, and see you back same day to review Quit smoking.  This is very important to your heart health even if you do not have COPD. Work on weight loss and conditioning.

## 2011-10-19 NOTE — Progress Notes (Signed)
  Subjective:    Patient ID: [name], female    DOB: Jul 27, 1951, 61 y.o.   MRN: [id number]  HPI The patient is a 61 year old female who I've been asked to see for possible COPD.  She has a long history of smoking, however pulmonary function studies reviewed from 2011 showed restriction rather than obstruction.  She also has a known cardiomyopathy and is morbidly obese.  She is also continuing to smoke cigarettes.  She describes a less than one block dyspnea on exertion on flat ground at a moderate pace, and will get winded bringing groceries in from the car or going up one flight of stairs.  She has minimal cough or mucus production.  She has intermittent lower extremity edema that is controlled with diuretics.  She is on chronic oxygen therapy for her underlying heart disease.  She has had a chest x-ray in the summer of 2012 which showed small lung volumes, cardiomegaly, and no acute process.   Review of Systems  Constitutional: Negative for fever and unexpected weight change.  HENT: Negative for ear pain, nosebleeds, congestion, sore throat, rhinorrhea, sneezing, trouble swallowing, dental problem, postnasal drip and sinus pressure.   Eyes: Negative for redness and itching.  Respiratory: Positive for shortness of breath. Negative for cough, chest tightness and wheezing.   Cardiovascular: Negative for palpitations and leg swelling.  Gastrointestinal: Negative for nausea and vomiting.  Genitourinary: Negative for dysuria.  Musculoskeletal: Negative for joint swelling.  Skin: Negative for rash.  Neurological: Negative for headaches.  Hematological: Does not bruise/bleed easily.  Psychiatric/Behavioral: Negative for dysphoric mood. The patient is not nervous/anxious.        Objective:   Physical Exam Constitutional:  Morbidly obese, no acute distress  HENT:  Nares patent without discharge  Oropharynx without exudate, palate and uvula are normal  Eyes:  Perrla, eomi, no scleral  icterus  Neck:  No JVD, no TMG  Cardiovascular:  Normal rate, regular rhythm, no rubs or gallops. 3/6 sem        Intact distal pulses but decreased  Pulmonary :  Normal breath sounds, no stridor or respiratory distress   No rhonchi, or wheezing, faint basilar crackles.  Abdominal:  Soft, nondistended, bowel sounds present.  No tenderness noted.   Musculoskeletal: 2+ lower extremity edema noted.  Lymph Nodes:  No cervical lymphadenopathy noted  Skin:  No cyanosis noted  Neurologic:  Alert, appropriate, moves all 4 extremities without obvious deficit.         Assessment & Plan:

## 2011-10-19 NOTE — Assessment & Plan Note (Addendum)
The patient has a long-standing history of smoking, however her pulmonary function studies in 2011 showed restriction rather than obstruction.  She has significant dyspnea on exertion that I suspect is related to her cardiomyopathy and obesity/deconditioning more than anything else.  Her chest x-ray also shows small lung volumes which would be more consistent with restrictive disease. She unfortunately has continued to smoke, and therefore I cannot rule out obstructive disease that has developed over the last 2 years.  We'll go ahead and schedule her for repeat PFTs, and I have encouraged her to work aggressively on smoking cessation.  She is to continue on her inhaler regimen for now, but we may need to alter this depending upon the results.

## 2011-11-02 ENCOUNTER — Ambulatory Visit (INDEPENDENT_AMBULATORY_CARE_PROVIDER_SITE_OTHER): Payer: Medicare Other | Admitting: Pulmonary Disease

## 2011-11-02 ENCOUNTER — Encounter: Payer: Self-pay | Admitting: Pulmonary Disease

## 2011-11-02 VITALS — BP 132/80 | HR 69 | Temp 97.7°F | Ht 60.0 in | Wt 247.0 lb

## 2011-11-02 DIAGNOSIS — R0600 Dyspnea, unspecified: Secondary | ICD-10-CM

## 2011-11-02 DIAGNOSIS — R0989 Other specified symptoms and signs involving the circulatory and respiratory systems: Secondary | ICD-10-CM

## 2011-11-02 DIAGNOSIS — R0609 Other forms of dyspnea: Secondary | ICD-10-CM

## 2011-11-02 LAB — PULMONARY FUNCTION TEST

## 2011-11-02 NOTE — Progress Notes (Signed)
PFT was done today.  

## 2011-11-02 NOTE — Progress Notes (Signed)
  Subjective:    Patient ID: [name], female    DOB: [birth year], 61 y.o.   MRN: [id number]  HPI The patient comes in today for followup after her recent PFTs.  She was found to have minimal airflow obstruction, primarily manifested as air trapping.  She had mild restriction, and a moderate decrease in diffusion capacity that corrected with alveolar volume adjustment.  I have reviewed the study with her in detail, and answered all her questions.   Review of Systems  Constitutional: Positive for fever. Negative for unexpected weight change.  HENT: Positive for congestion, rhinorrhea, sneezing, postnasal drip and sinus pressure. Negative for ear pain, nosebleeds, sore throat, trouble swallowing and dental problem.   Eyes: Positive for redness and itching.  Respiratory: Positive for cough, shortness of breath and wheezing. Negative for chest tightness.   Cardiovascular: Negative for palpitations and leg swelling.  Gastrointestinal: Negative for nausea and vomiting.  Genitourinary: Negative for dysuria.  Musculoskeletal: Negative for joint swelling.  Skin: Positive for rash.  Neurological: Negative for headaches.  Hematological: Does not bruise/bleed easily.  Psychiatric/Behavioral: Positive for dysphoric mood. The patient is nervous/anxious.        Objective:   Physical Exam Obese female in no acute distress Nose without purulence or discharge noted Chest totally clear to auscultation, no wheezes Cardiac exam with regular rate and rhythm Lower extremities with 1-2+ edema, no cyanosis Alert and oriented, moves all 4 extremities.       Assessment & Plan:

## 2011-11-02 NOTE — Assessment & Plan Note (Signed)
The patient's PFTs showed no airflow obstruction on spirometry, and only minimal air trapping on lung volumes.  She does have restriction as expected given her orbit obesity, and has a decrease in DLCO most likely related to her known cardiomyopathy.  There is really no significant lung disease that is contributing to her shortness of breath.  I have asked the patient to continue on Advair for now given her ongoing smoking, but would discontinue the medication once she has stopped smoking for 6 months or more.  I have encouraged her to work aggressively on weight loss.  We'll see her back on a p.r.n. Basis.

## 2011-11-02 NOTE — Patient Instructions (Signed)
Your breathing tests show that you have minimal copd Stop smoking completely Stay on advair for now, but if you are able to stay off cigarettes for 19mos, would stop advair and see how you do. Work on [employer] loss You do not need followup with me, and will send a note to your primary doctor.

## 2011-12-22 ENCOUNTER — Ambulatory Visit (INDEPENDENT_AMBULATORY_CARE_PROVIDER_SITE_OTHER): Payer: Medicare Other | Admitting: Cardiology

## 2011-12-22 ENCOUNTER — Encounter: Payer: Self-pay | Admitting: Cardiology

## 2011-12-22 VITALS — BP 177/95 | HR 80 | Ht 60.0 in | Wt 253.0 lb

## 2011-12-22 DIAGNOSIS — E785 Hyperlipidemia, unspecified: Secondary | ICD-10-CM

## 2011-12-22 DIAGNOSIS — Z95810 Presence of automatic (implantable) cardiac defibrillator: Secondary | ICD-10-CM

## 2011-12-22 DIAGNOSIS — R0609 Other forms of dyspnea: Secondary | ICD-10-CM

## 2011-12-22 DIAGNOSIS — F17200 Nicotine dependence, unspecified, uncomplicated: Secondary | ICD-10-CM

## 2011-12-22 DIAGNOSIS — E669 Obesity, unspecified: Secondary | ICD-10-CM

## 2011-12-22 DIAGNOSIS — I10 Essential (primary) hypertension: Secondary | ICD-10-CM

## 2011-12-22 DIAGNOSIS — R0989 Other specified symptoms and signs involving the circulatory and respiratory systems: Secondary | ICD-10-CM

## 2011-12-22 DIAGNOSIS — I428 Other cardiomyopathies: Secondary | ICD-10-CM

## 2011-12-22 DIAGNOSIS — R0600 Dyspnea, unspecified: Secondary | ICD-10-CM

## 2011-12-22 NOTE — Progress Notes (Signed)
HPI: Pleasant female with history of nonischemic cardiomyopathy, hypertension, hyperlipidemia and diabetes mellitus for followup. Also with previous ICD. Last Myoview was performed in June of 2008. Her ejection fraction was 33%. No ischemia but there was soft tissue attenuation. Echocardiogram in December of 2007 showed an ejection fraction of 35-40%. Echocardiogram in June of 2012 showed an ejection fraction of 50-55%, mild left atrial enlargement and mild aortic insufficiency. I last saw her in Sept 2012. Since then, she has some dyspnea on exertion and pedal edema. She has occasional pain over her ICD site but no exertional chest pain.   Current Outpatient Prescriptions  Medication Sig Dispense Refill  . albuterol (VENTOLIN HFA) 108 (90 BASE) MCG/ACT inhaler Inhale 2 puffs into the lungs every 6 (six) hours as needed.        [name] aspirin 81 MG tablet Take 81 mg by mouth daily.        [name] atorvastatin (LIPITOR) 40 MG tablet Take 40 mg by mouth daily.        . citalopram (CELEXA) 20 MG tablet Take 20 mg by mouth daily.        . Fluticasone-Salmeterol (ADVAIR) 250-50 MCG/DOSE AEPB Inhale 1 puff into the lungs every 12 (twelve) hours.        . furosemide (LASIX) 40 MG tablet Take 40 mg by mouth daily.        [name] gabapentin (NEURONTIN) 300 MG capsule Take 300 mg by mouth Nightly.        . insulin aspart (NOVOLOG) 100 UNIT/ML injection Inject into the skin 3 (three) times daily before meals. Sliding scale       . insulin glargine (LANTUS) 100 UNIT/ML injection Inject 42 Units into the skin 2 (two) times daily.       . Insulin Pen Needle (PEN NEEDLES) 31G X 6 MM MISC 2 (two) times daily.        [name] ketoconazole (NIZORAL) 2 % shampoo Apply topically. Apply to scalp three times per week - let lather and sit for 5 minutes and then rinse.       . lisinopril (PRINIVIL,ZESTRIL) 40 MG tablet Take 40 mg by mouth daily.        . metoprolol (TOPROL XL) 200 MG 24 hr tablet Take 200 mg by mouth daily.        . naproxen  (NAPROSYN) 500 MG tablet Take 500 mg by mouth 2 (two) times daily with a meal.        . NON FORMULARY 2 L. Oxygen; continuous       . spironolactone (ALDACTONE) 25 MG tablet Take 25 mg by mouth daily.        . traMADol (ULTRAM) 50 MG tablet Take 50 mg by mouth 2 (two) times daily as needed. Take 1-2 tablets      . triamcinolone (KENALOG) 0.1 % ointment Apply topically as needed.           Past Medical History  Diagnosis Date  . Hyperthyroidism, subclinical   . Post menopausal syndrome   . Sleep apnea   . Obesity   . Cardiac defibrillator in situ     Atlas II VR (SJM) implanted by Dr Lovena Le  . Eczema   . Depression   . Lipoma   . Chronic ulcer of leg   . Hyperlipidemia   . Chronic systolic congestive heart failure   . Cardiomyopathy     nonischemic  . Diabetes mellitus   . HTN (hypertension)   . COPD (  chronic obstructive pulmonary disease)     Past Surgical History  Procedure Date  . Abdominal hysterectomy   . Tubal ligation   . Cardiac defibrillator placement 05/04/2007    SJM Atlas II VR ICD  . Hysteroscopy     History   Social History  . Marital Status: Married    Spouse Name: Alroy Dust    Number of Children: N/A  . Years of Education: N/A   Occupational History  . disabled    Social History Main Topics  . Smoking status: Current Everyday Smoker -- 0.5 packs/day for 20 years    Types: Cigarettes  . Smokeless tobacco: Not on file   Comment: currently smoking 8 cigs daily.   . Alcohol Use: No     Quit 05/2007  . Drug Use: No  . Sexually Active: Not on file   Other Topics Concern  . Not on file   Social History Narrative   Married    ROS: no fevers or chills, productive cough, hemoptysis, dysphasia, odynophagia, melena, hematochezia, dysuria, hematuria, rash, seizure activity, orthopnea, PND, claudication. Remaining systems are negative.  Physical Exam: Well-developed morbidly obese in no acute distress.  Skin is warm and dry.  HEENT is normal.    Neck is supple.  Chest is clear to auscultation with normal expansion.  Cardiovascular exam is regular rate and rhythm.  Abdominal exam nontender or distended. No masses palpated. Extremities show trace edema. neuro grossly intact  ECG sinus rhythm at a rate of 80. Nonspecific T-wave changes. Prolonged QT interval.

## 2011-12-22 NOTE — Assessment & Plan Note (Signed)
Management per electrophysiology. 

## 2011-12-22 NOTE — Assessment & Plan Note (Signed)
Patient counseled on weight loss. 

## 2011-12-22 NOTE — Patient Instructions (Signed)
Your physician wants you to follow-up in: ONE YEAR You will receive a reminder letter in the mail two months in advance. If you don't receive a letter, please call our office to schedule the follow-up appointment.  

## 2011-12-22 NOTE — Assessment & Plan Note (Signed)
Blood pressure elevated but she states she has not taken her medications this morning. She states it is typically controlled. Continue to monitor and adjust regimen as needed.

## 2011-12-22 NOTE — Assessment & Plan Note (Signed)
Patient's LV function improved on most recent echocardiogram. Continue present dose of ACE inhibitor and beta blocker. Euvolemic on examination. Continue present dose of diuretic. Potassium and renal function monitored by primary care.

## 2011-12-22 NOTE — Assessment & Plan Note (Signed)
Continue statin. Lipids and liver are monitored by primary care.

## 2011-12-22 NOTE — Assessment & Plan Note (Signed)
Patient counseled on discontinuing and provided with information for cessation class.

## 2011-12-29 ENCOUNTER — Encounter: Payer: Self-pay | Admitting: Internal Medicine

## 2011-12-29 ENCOUNTER — Ambulatory Visit (INDEPENDENT_AMBULATORY_CARE_PROVIDER_SITE_OTHER): Payer: Medicare Other | Admitting: *Deleted

## 2011-12-29 DIAGNOSIS — I428 Other cardiomyopathies: Secondary | ICD-10-CM

## 2011-12-29 DIAGNOSIS — I5022 Chronic systolic (congestive) heart failure: Secondary | ICD-10-CM

## 2011-12-29 LAB — ICD DEVICE OBSERVATION
BATTERY VOLTAGE: 2.57 V
BRDY-0002RV: 40 {beats}/min
CHARGE TIME: 0 s
DEVICE MODEL ICD: 436280
HV IMPEDENCE: 40 Ohm
RV LEAD AMPLITUDE: 11.9 mv
RV LEAD IMPEDENCE ICD: 465 Ohm
RV LEAD THRESHOLD: 0.5 V
TOT-0006: 20121227000000
TOT-0007: 3
TOT-0008: 0
TOT-0009: 4
TOT-0010: 29
TZAT-0001SLOWVT: 1
TZAT-0004SLOWVT: 8
TZAT-0012SLOWVT: 200 ms
TZAT-0013SLOWVT: 1
TZAT-0018SLOWVT: NEGATIVE
TZAT-0019SLOWVT: 7.5 V
TZAT-0020SLOWVT: 1 ms
TZON-0003SLOWVT: 300 ms
TZON-0004SLOWVT: 16
TZON-0005SLOWVT: 6
TZST-0001SLOWVT: 2
TZST-0001SLOWVT: 3
TZST-0001SLOWVT: 4
TZST-0001SLOWVT: 5
TZST-0003SLOWVT: 25 J
TZST-0003SLOWVT: 36 J
TZST-0003SLOWVT: 36 J
TZST-0003SLOWVT: 36 J
VENTRICULAR PACING ICD: 0 pct

## 2011-12-29 NOTE — Progress Notes (Signed)
ICD check

## 2012-01-10 NOTE — Progress Notes (Signed)
Patient ID: [name], female   DOB: [birth year], 61 y.o.   MRN: [id number]  Patient was not seen.

## 2012-01-17 ENCOUNTER — Emergency Department (HOSPITAL_COMMUNITY): Payer: Medicare Other

## 2012-01-17 ENCOUNTER — Encounter (HOSPITAL_COMMUNITY): Payer: Self-pay | Admitting: *Deleted

## 2012-01-17 ENCOUNTER — Emergency Department (HOSPITAL_COMMUNITY)
Admission: EM | Admit: 2012-01-17 | Discharge: 2012-01-17 | Disposition: A | Discharge status: Home / Self Care | Payer: Medicare Other | Attending: Emergency Medicine | Admitting: Emergency Medicine

## 2012-01-17 DIAGNOSIS — R109 Unspecified abdominal pain: Secondary | ICD-10-CM | POA: Insufficient documentation

## 2012-01-17 DIAGNOSIS — J449 Chronic obstructive pulmonary disease, unspecified: Secondary | ICD-10-CM | POA: Insufficient documentation

## 2012-01-17 DIAGNOSIS — F3289 Other specified depressive episodes: Secondary | ICD-10-CM | POA: Insufficient documentation

## 2012-01-17 DIAGNOSIS — E669 Obesity, unspecified: Secondary | ICD-10-CM | POA: Insufficient documentation

## 2012-01-17 DIAGNOSIS — R6883 Chills (without fever): Secondary | ICD-10-CM | POA: Insufficient documentation

## 2012-01-17 DIAGNOSIS — F329 Major depressive disorder, single episode, unspecified: Secondary | ICD-10-CM | POA: Insufficient documentation

## 2012-01-17 DIAGNOSIS — E0590 Thyrotoxicosis, unspecified without thyrotoxic crisis or storm: Secondary | ICD-10-CM | POA: Insufficient documentation

## 2012-01-17 DIAGNOSIS — Z95810 Presence of automatic (implantable) cardiac defibrillator: Secondary | ICD-10-CM | POA: Insufficient documentation

## 2012-01-17 DIAGNOSIS — Z7982 Long term (current) use of aspirin: Secondary | ICD-10-CM | POA: Insufficient documentation

## 2012-01-17 DIAGNOSIS — I10 Essential (primary) hypertension: Secondary | ICD-10-CM | POA: Insufficient documentation

## 2012-01-17 DIAGNOSIS — J4489 Other specified chronic obstructive pulmonary disease: Secondary | ICD-10-CM | POA: Insufficient documentation

## 2012-01-17 DIAGNOSIS — E785 Hyperlipidemia, unspecified: Secondary | ICD-10-CM | POA: Insufficient documentation

## 2012-01-17 DIAGNOSIS — R630 Anorexia: Secondary | ICD-10-CM | POA: Insufficient documentation

## 2012-01-17 DIAGNOSIS — Z79899 Other long term (current) drug therapy: Secondary | ICD-10-CM | POA: Insufficient documentation

## 2012-01-17 DIAGNOSIS — E119 Type 2 diabetes mellitus without complications: Secondary | ICD-10-CM | POA: Insufficient documentation

## 2012-01-17 DIAGNOSIS — Z794 Long term (current) use of insulin: Secondary | ICD-10-CM | POA: Insufficient documentation

## 2012-01-17 DIAGNOSIS — R5381 Other malaise: Secondary | ICD-10-CM | POA: Insufficient documentation

## 2012-01-17 DIAGNOSIS — R079 Chest pain, unspecified: Secondary | ICD-10-CM | POA: Insufficient documentation

## 2012-01-17 DIAGNOSIS — R112 Nausea with vomiting, unspecified: Secondary | ICD-10-CM | POA: Insufficient documentation

## 2012-01-17 DIAGNOSIS — R5383 Other fatigue: Secondary | ICD-10-CM | POA: Insufficient documentation

## 2012-01-17 LAB — URINALYSIS, ROUTINE W REFLEX MICROSCOPIC
Bilirubin Urine: NEGATIVE
Glucose, UA: 500 mg/dL — AB
Hgb urine dipstick: NEGATIVE
Ketones, ur: 15 mg/dL — AB
Leukocytes, UA: NEGATIVE
Nitrite: POSITIVE — AB
Protein, ur: NEGATIVE mg/dL
Specific Gravity, Urine: >1.046 — ABNORMAL HIGH (ref 1.005–1.030)
Urobilinogen, UA: 1 mg/dL (ref 0.0–1.0)
pH: 5.5 (ref 5.0–8.0)

## 2012-01-17 LAB — CBC
HCT: 41.5 % (ref 36.0–46.0)
Hemoglobin: 13.9 g/dL (ref 12.0–15.0)
MCH: 26.8 pg (ref 26.0–34.0)
MCHC: 33.5 g/dL (ref 30.0–36.0)
MCV: 80.1 fL (ref 78.0–100.0)
Platelets: 240 10*3/uL (ref 150–400)
RBC: 5.18 MIL/uL — ABNORMAL HIGH (ref 3.87–5.11)
RDW: 16 % — ABNORMAL HIGH (ref 11.5–15.5)
WBC: 9.7 10*3/uL (ref 4.0–10.5)

## 2012-01-17 LAB — CK TOTAL AND CKMB (NOT AT ARMC)
CK, MB: 3.7 ng/mL (ref 0.3–4.0)
Relative Index: 1.8 (ref 0.0–2.5)
Total CK: 202 U/L — ABNORMAL HIGH (ref 7–177)

## 2012-01-17 LAB — DIFFERENTIAL
Basophils Absolute: 0.1 10*3/uL (ref 0.0–0.1)
Basophils Relative: 1 % (ref 0–1)
Eosinophils Absolute: 0.1 10*3/uL (ref 0.0–0.7)
Eosinophils Relative: 1 % (ref 0–5)
Lymphocytes Relative: 22 % (ref 12–46)
Lymphs Abs: 2.1 10*3/uL (ref 0.7–4.0)
Monocytes Absolute: 0.5 10*3/uL (ref 0.1–1.0)
Monocytes Relative: 5 % (ref 3–12)
Neutro Abs: 6.9 10*3/uL (ref 1.7–7.7)
Neutrophils Relative %: 72 % (ref 43–77)

## 2012-01-17 LAB — URINE MICROSCOPIC-ADD ON

## 2012-01-17 LAB — COMPREHENSIVE METABOLIC PANEL
ALT: 19 U/L (ref 0–35)
AST: 18 U/L (ref 0–37)
Albumin: 4.2 g/dL (ref 3.5–5.2)
Alkaline Phosphatase: 116 U/L (ref 39–117)
BUN: 11 mg/dL (ref 6–23)
CO2: 24 mEq/L (ref 19–32)
Calcium: 9.5 mg/dL (ref 8.4–10.5)
Chloride: 103 mEq/L (ref 96–112)
Creatinine, Ser: 0.74 mg/dL (ref 0.50–1.10)
GFR calc Af Amer: >90 mL/min (ref >90)
GFR calc non Af Amer: >90 mL/min (ref >90)
Glucose, Bld: 249 mg/dL — ABNORMAL HIGH (ref 70–99)
Potassium: 3.3 mEq/L — ABNORMAL LOW (ref 3.5–5.1)
Sodium: 141 mEq/L (ref 135–145)
Total Bilirubin: 0.7 mg/dL (ref 0.3–1.2)
Total Protein: 7.3 g/dL (ref 6.0–8.3)

## 2012-01-17 LAB — LIPASE, BLOOD: Lipase: 119 U/L — ABNORMAL HIGH (ref 11–59)

## 2012-01-17 LAB — CARDIAC PANEL(CRET KIN+CKTOT+MB+TROPI)
CK, MB: 3.6 ng/mL (ref 0.3–4.0)
Relative Index: 1.8 (ref 0.0–2.5)
Total CK: 203 U/L — ABNORMAL HIGH (ref 7–177)
Troponin I: <0.3 ng/mL (ref <0.30)

## 2012-01-17 LAB — POCT I-STAT TROPONIN I: Troponin i, poc: 0.03 ng/mL (ref 0.00–0.08)

## 2012-01-17 MED ORDER — ONDANSETRON 4 MG PO TBDP
8.0000 mg | ORAL_TABLET | Freq: Once | ORAL | Status: AC
  Administered 2012-01-17: 8 mg via ORAL
  Filled 2012-01-17: qty 2

## 2012-01-17 MED ORDER — MORPHINE SULFATE 4 MG/ML IJ SOLN
4.0000 mg | Freq: Once | INTRAMUSCULAR | Status: AC
  Administered 2012-01-17: 4 mg via INTRAVENOUS
  Filled 2012-01-17: qty 1

## 2012-01-17 MED ORDER — ONDANSETRON 8 MG PO TBDP: 8.0000 mg | ORAL_TABLET | Freq: Three times a day (TID) | ORAL | Status: AC | PRN

## 2012-01-17 MED ORDER — LABETALOL HCL 5 MG/ML IV SOLN
10.0000 mg | Freq: Once | INTRAVENOUS | Status: AC
  Administered 2012-01-17: 10 mg via INTRAVENOUS
  Filled 2012-01-17: qty 4

## 2012-01-17 MED ORDER — OXYCODONE-ACETAMINOPHEN 5-325 MG PO TABS: 2.0000 | ORAL_TABLET | ORAL | Status: AC | PRN

## 2012-01-17 MED ORDER — IOHEXOL 300 MG/ML  SOLN
100.0000 mL | Freq: Once | INTRAMUSCULAR | Status: AC | PRN
  Administered 2012-01-17: 100 mL via INTRAVENOUS

## 2012-01-17 MED ORDER — IOHEXOL 300 MG/ML  SOLN: 20.0000 mL | INTRAMUSCULAR | Status: AC

## 2012-01-17 NOTE — ED Notes (Signed)
Patient is resting comfortably. 

## 2012-01-17 NOTE — ED Notes (Signed)
TRANSPORTED TO CT SCAN.  

## 2012-01-17 NOTE — Discharge Instructions (Signed)
Take medications for nausea/vomiting as needed.  Drink plenty of fluids.  Stick to bland diet.  Return to the ER for worsening pain, vomiting despite medication, fevers, or new symptoms.  Follow up with your doctor in 3-5 days for recheck.  If you do not have a doctor, see numbers listed to set up a primary care physician.   GI ANTIEMETIC  GI ANTIEMETIC: You have been given a prescription for a medication for nausea and vomiting.      It is OK to take this medication if you are pregnant.  Be sure to tell your regular doctor or obstetrician [facility] doctor) that you have been taking this medication.     Take this medication as directed.     If you are taking phenobarbital, narcotic pain medications, antidepressants, or sleeping pills your dosage may need to be adjusted.  Be sure to inform your doctor of all the other medications that you are taking.     DO NOT take this medication if you have liver disease or heart disease.     DO NOT take pain killers (narcotic medication) unless specifically instructed to do so by your doctor     DO NOT drink alcoholic beverages while taking this medicine.     If you develop any reactions that you believe may be from the medication be sure to tell your doctor or return to the ER (Some reactions may include:  dizziness, shaking, visual disturbances, nervousness, fainting, rash).     If you become dizzy, sit or lie down at the first signs.  You should be careful going up and down stairs.     Keep this medication out of the reach of children.  Always keep this medication in child-proof containers.  DO NOT give your medication to anyone else. You have been given a medication, or a prescription for a medication, that causes drowsiness or dizziness.  DO NOT drive a car, operate machinery, ride a bike, or perform jobs that require you to be alert until you know how you are going to react to this medication.  THESE INSTRUCTIONS ARE NOT COMPREHENSIVE (complete):  Ask  your pharmacist for additional information and precautions for this medication.   VOMITING  VOMITING: You have been seen for vomiting.  Vomiting (throwing-up) can be caused by many different things. Most of the time the cause IS NOT serious and it is safe to send someone home when they have been vomiting.  Some common causes of vomiting include the following:      Gastroenteritis (stomach flu). Diarrhea usually also occurs with "stomach flu."     Other illnesses. Sometimes other medical conditions such as diabetes, heart problems, headaches, and painful conditions can make someone throw-up. Certain infections, even in other parts of the body, can cause vomiting.     Bowel obstructions (blockages) can cause vomiting along with the inability to have bowel movements (stool) or pass gas.     Vomiting can also be a symptom of appendicitis, especially if there is also pain in the right lower abdomen (belly). The treatment for vomiting is to try to find the cause for the vomiting and make it better. Sometimes the cause can't be easily found. Sometimes vomiting is treated with anti-nausea medicines like Phenergan (promethazine), Compazine (prochlorperazine) or Zofran (ondansetron).  You should try to drink liquids to avoid dehydration.  Rather than drinking a lot of fluid all at once, you should take small sips continuously throughout the day.  YOU SHOULD SEEK  MEDICAL ATTENTION IMMEDIATELY, EITHER HERE OR AT THE NEAREST EMERGENCY DEPARTMENT, IF ANY OF THE FOLLOWING OCCURS:      Persistent vomiting that is not relieved with medication.     Inability to keep liquids down.     Severe sudden chest or belly pain after vomiting.     Abdominal pain.  NAUSEA  NAUSEA: You have been seen today for nausea.  Nausea is the medical word that means that you feel that you are going to throw up (vomit). Nausea is not a disease but rather it is a symptom of another problem. For example, nausea,  vomiting and diarrhea are symptoms of a stomach virus (the "stomach flu").  The feeling of nausea may also go along with actually vomiting (throwing up).  The symptom of nausea means that there is some other problem or disease causing the nausea. The nausea itself is not dangerous but it can be very uncomfortable.  Many causes of nausea can not be determined by the tests available in an Urgent Care or Emergency Department. It is VERY IMPORTANT to follow up with your doctor for on going care and evaluation.  There are a number of treatments available for nausea. These can be discussed with the medical staff along with treatment of the illness causing the nausea. Some of the more common medications (given by prescription) that are used to help with nausea include the following:      Promethazine (Phenergan), Prochlorperazine (Compazine), Metoclopramide (Reglan), Ondansetron (Zofran), and many others. In order to avoid worsening your nausea and causing you to vomit, it is recommended that you follow a clear liquid diet (such as water, clear broth, 7-up, Sprite, other clear caffeine-free soft drinks, or sports drinks) until you feel better.  It is STRONGLY RECOMMENDED that if your nausea continues for longer than a few days, or other new symptoms arise, that you be reevaluated by your family doctor, specialist or clinic. If you cannot obtain this follow-up care you can always return here or go to the nearest Emergency Department to be seen again.  YOU SHOULD SEEK MEDICAL ATTENTION IMMEDIATELY, EITHER HERE OR AT THE NEAREST EMERGENCY DEPARTMENT, IF ANY OF THE FOLLOWING OCCURS:      Repeated, continuous vomiting.     Abdominal pain.     Vomiting blood or anything that looks like coffee grounds in your vomit.     Headache.     Any new, concerning symptoms that appear after you are discharged from here today.     Any worsening of the general feeling of unwellness.  B.R.A.T. Diet Your doctor  has recommended the B.R.A.T. diet for you until your condition improves. This is often used to help control diarrhea and vomiting symptoms. If your child can tolerate clear liquids, you may offer:       Bananas       Rice       Applesauce       Toast (and other simple starches such as crackers, potatoes, noodles)   Be sure to avoid dairy products, meats, and fatty foods until your child's  symptoms are completely better.     *RESOURCE GUIDE  RESOURCE GUIDE  Dental Problems  Patients with Medicaid: [facility] [phone] [address]                                                                   [address] [employer] Phone:  [phone]                                                                             Phone:  [phone]  If unable to pay or uninsured, contact:  [facility] or [facility]. to become qualified for the adult dental clinic.  Chronic Pain Problems Contact [facility]  [phone] Patients need to be referred by their primary care doctor.  Insufficient Money for Medicine Contact [employer]:  call "211" or [facility] 210-085-5189.  No Primary Care Doctor Call [facility]  475-365-2391 Other agencies that provide inexpensive medical care    [facility]  [phone]    [facility]  [phone]    [facility]  [phone]    [facility]  [phone]    [facility]  [phone]    [facility]  [phone]  Psychological Services [facility] Behavior[facility]  [phone] [facility]  [phone] [facility] Mental Health   [phone] (emergency services [phone])  Abuse/Neglect [facility] Child Abuse Hotline (978)649-5773 [facility] Child Abuse Hotline (812)197-8965 (After Hours)  Emergency Shelter [facility] Ministries (815)536-6662  [facility] [place] [facility]  702-319-3906 [facility] Services 316-010-8352  MRSA Hotline #:   780 716 2765  [facility] Resources  [facility] [place]     [employer]                          [facility] Dept. [address] [place]                        [address]          [address]  Blondell Reveal Phone:  [phone]                                     Phone:  [phone]                   Phone:  [phone]  [facility] Mental Health Phone:  [phone]  [facility] Child Abuse Hotline (223)744-5609)  161-0960 213-887-0425 (After Hours)      If you develop symptoms of Shortness of Breath, Chest Pain, Swelling of lips, mouth or tongue or if your condition becomes worse with any new symptoms, see your doctor or return to the Emergency Department for immediate care. Emergency services are not intended to be a substitute for comprehensive medical attention.  Please contact your doctor for follow up if not improving as expected.   Call your doctor in 5-7 days or as directed if there is no improvement.   Community Resources: *IF YOU ARE IN IMMEDIATE DANGER CALL 911!  Abuse/Neglect:  Family Services Crisis Hotline [facility]): 515 424 3727 [facility] Against [facility]): 506-757-5910  After hours, holidays and weekends: 915-466-5006 National Domestic Violence Hotline: 640-051-7874  Mental Health: [facility]: Drucie Ip: 309-094-9227  Health Clinics:  Urgent Care Center [facility]): [phone] Monday - Friday 8 AM - 9 PM, Saturday and Sunday 10 AM - 9 PM  [facility] [place]: [phone] Monday - Friday 8 AM - 5 PM  [facility]  [address]: [phone] Monday- Friday 8:30 AM - 5:30 PM, Sat 9 AM - 1 PM  24 HR  Pharmacies [facility] [address]: [phone] [facility]  [address]: [phone] [facility] on [address]: [phone]  24 HR [facility]: [address] [phone]  Cultures: If culture results are positive, we will notify you if a change in treatment is necessary.  LABORATORY TESTS:         If you had any labs drawn in the ED that have not resulted by the time you are discharged home, we will review these lab results and the treatment given to you.  If there is any further treatment or notification needed, we will contact you by phone, or letter.  "PLEASE ENSURE THAT YOU HAVE GIVEN US YOUR CURRENT WORKING PHONE NUMBER AND YOUR CURRENT ADDRESS, so that we can contact you if needed."  RADIOLOGY TESTS:  If the referred physician wants today\'s x-rays, please call the hospital\'s Radiology Department the day before your doctor\'s appointment. [place]     [phone] [place]   [phone] [place]     [phone]  Our doctors and staff appreciate your choosing [place] for your emergency medical care needs. We are here to serve you.   Abdominal Pain Abdominal pain can be caused by many things. Your caregiver decides the seriousness of your pain by an examination and possibly blood tests and X-rays. Many cases can be observed and treated at home. Most abdominal pain is not caused by a disease and will probably improve without treatment. However, in many cases, more time must pass before a clear cause of the pain can be found. Before that point, it may not be known if you need more testing, or if hospitalization or surgery is needed. HOME CARE INSTRUCTIONS   Do not take laxatives unless directed by your caregiver.   Take pain medicine only as directed by your caregiver.   Only take over-the-counter or prescription medicines for pain, discomfort, or fever as directed by your caregiver.   Try a clear liquid diet (broth, tea, or water) for as long as directed by your caregiver. Slowly move to a bland diet as  tolerated.  SEEK IMMEDIATE MEDICAL CARE IF:   The pain does not go away.   You have a fever.   You keep  throwing up (vomiting).   The pain is felt only in portions of the abdomen. Pain in the right side could possibly be appendicitis. In an adult, pain in the left lower portion of the abdomen could be colitis or diverticulitis.   You pass bloody or black tarry stools.  MAKE SURE YOU:   Understand these instructions.   Will watch your condition.   Will get help right away if you are not doing well or get worse.  Document Released: 06/29/2005 Document Revised: 09/08/2011 Document Reviewed: 05/07/2008 [facility] Patient Information 2012 [place], [place].  Nausea and Vomiting Nausea is a sick feeling that often comes before throwing up (vomiting). Vomiting is a reflex where stomach contents come out of your mouth. Vomiting can cause severe loss of body fluids (dehydration). Children and elderly adults can become dehydrated quickly, especially if they also have diarrhea. Nausea and vomiting are symptoms of a condition or disease. It is important to find the cause of your symptoms. CAUSES   Direct irritation of the stomach lining. This irritation can result from increased acid production (gastroesophageal reflux disease), infection, food poisoning, taking certain medicines (such as nonsteroidal anti-inflammatory drugs), alcohol use, or tobacco use.   Signals from the brain.These signals could be caused by a headache, heat exposure, an inner ear disturbance, increased pressure in the brain from injury, infection, a tumor, or a concussion, pain, emotional stimulus, or metabolic problems.   An obstruction in the gastrointestinal tract (bowel obstruction).   Illnesses such as diabetes, hepatitis, gallbladder problems, appendicitis, kidney problems, cancer, sepsis, atypical symptoms of a heart attack, or eating disorders.   Medical treatments such as chemotherapy and radiation.   Receiving  medicine that makes you sleep (general anesthetic) during surgery.  DIAGNOSIS Your caregiver may ask for tests to be done if the problems do not improve after a few days. Tests may also be done if symptoms are severe or if the reason for the nausea and vomiting is not clear. Tests may include:  Urine tests.   Blood tests.   Stool tests.   Cultures (to look for evidence of infection).   X-rays or other imaging studies.  Test results can help your caregiver make decisions about treatment or the need for additional tests. TREATMENT You need to stay well hydrated. Drink frequently but in small amounts.You may wish to drink water, sports drinks, clear broth, or eat frozen ice pops or gelatin dessert to help stay hydrated.When you eat, eating slowly may help prevent nausea.There are also some antinausea medicines that may help prevent nausea. HOME CARE INSTRUCTIONS   Take all medicine as directed by your caregiver.   If you do not have an appetite, do not force yourself to eat. However, you must continue to drink fluids.   If you have an appetite, eat a normal diet unless your caregiver tells you differently.   Eat a variety of complex carbohydrates (rice, wheat, potatoes, bread), lean meats, yogurt, fruits, and vegetables.   Avoid high-fat foods because they are more difficult to digest.   Drink enough water and fluids to keep your urine clear or pale yellow.   If you are dehydrated, ask your caregiver for specific rehydration instructions. Signs of dehydration may include:   Severe thirst.   Dry lips and mouth.   Dizziness.   Dark urine.   Decreasing urine frequency and amount.   Confusion.   Rapid breathing or pulse.  SEEK IMMEDIATE MEDICAL CARE IF:   You have blood or brown flecks (  like coffee grounds) in your vomit.   You have black or bloody stools.   You have a severe headache or stiff neck.   You are confused.   You have severe abdominal pain.   You have  chest pain or trouble breathing.   You do not urinate at least once every 8 hours.   You develop cold or clammy skin.   You continue to vomit for longer than 24 to 48 hours.   You have a fever.  MAKE SURE YOU:   Understand these instructions.   Will watch your condition.   Will get help right away if you are not doing well or get worse.  Document Released: 09/19/2005 Document Revised: 09/08/2011 Document Reviewed: 02/16/2011 [facility] Patient Information 2012 [place], [place].

## 2012-01-17 NOTE — ED Notes (Signed)
Patient transported to CT 

## 2012-01-17 NOTE — ED Notes (Signed)
Patient is unable to urinate at this time 

## 2012-01-17 NOTE — ED Provider Notes (Signed)
History     CSN: [id number]  Arrival date & time 01/17/12  0103   First MD Initiated Contact with Patient 01/17/12 [phone]      Chief Complaint  Patient presents with  . Emesis    (Consider location/radiation/quality/duration/timing/severity/associated sxs/prior treatment) HPI 61 year old female presents emergency department with complaint of abdominal pain and nausea vomiting. Patient reports onset of diffuse abdominal pain on Saturday. Patient with nausea but did not start vomiting until this morning. Patient had normal stool today no diarrhea. No sick contacts. No shortness of breath. Patient developed chest pain mid morning after having several episodes of vomiting. Patient is noted to be hypertensive, reports she's been unable to keep down any of her medications today. Patient denies fevers. No prior history of similar pain. No travel no unusual foods. Chest pain is sharp, central nonradiating, and has been present since vomiting around 10 AM chest pain does not wax and wane Past Medical History  Diagnosis Date  . Hyperthyroidism, subclinical   . Post menopausal syndrome   . Sleep apnea   . Obesity   . Cardiac defibrillator in situ     Atlas II VR (SJM) implanted by Dr Lovena Le  . Eczema   . Depression   . Lipoma   . Chronic ulcer of leg   . Hyperlipidemia   . Chronic systolic congestive heart failure   . Cardiomyopathy     nonischemic  . Diabetes mellitus   . HTN (hypertension)   . COPD (chronic obstructive pulmonary disease)     Past Surgical History  Procedure Date  . Abdominal hysterectomy   . Tubal ligation   . Cardiac defibrillator placement 05/04/2007    SJM Atlas II VR ICD  . Hysteroscopy     Family History  Problem Relation Age of Onset  . Stroke Mother   . Seizures Father   . Diabetes Sister   . Asthma Maternal Aunt     aunts  . Asthma Maternal Uncle     uncles  . Heart disease Father   . Heart disease Paternal Aunt     aunts  . Heart disease  Paternal Uncle     uncles  . Heart disease Maternal Aunt     aunts  . Heart disease Maternal Uncle     uncles  . Heart disease Maternal Grandfather     History  Substance Use Topics  . Smoking status: Current Everyday Smoker -- 0.5 packs/day for 20 years    Types: Cigarettes  . Smokeless tobacco: Not on file   Comment: currently smoking 8 cigs daily.   . Alcohol Use: No     Quit 05/2007    OB History    Grav Para Term Preterm Abortions TAB SAB Ect Mult Living                  Review of Systems  Constitutional: Positive for chills, appetite change and fatigue. Negative for fever.  Respiratory: Negative for cough, choking, chest tightness and shortness of breath.   Cardiovascular: Positive for chest pain. Negative for palpitations and leg swelling.  Gastrointestinal: Positive for nausea, vomiting and abdominal pain. Negative for diarrhea, constipation, blood in stool, abdominal distention and rectal pain.  Genitourinary: Negative for dysuria and flank pain.  All other systems reviewed and are negative.    Allergies  Avandia; Hydrocortisone; and Pioglitazone  Home Medications   Current Outpatient Rx  Name Route Sig Dispense Refill  . ALBUTEROL SULFATE HFA 108 (90 BASE) MCG/ACT IN  AERS Inhalation Inhale 2 puffs into the lungs every 6 (six) hours as needed.      . ASPIRIN 81 MG PO TABS Oral Take 81 mg by mouth daily.      . ATORVASTATIN CALCIUM 40 MG PO TABS Oral Take 40 mg by mouth daily.      [name] CITALOPRAM HYDROBROMIDE 20 MG PO TABS Oral Take 20 mg by mouth daily.      [name] FLUTICASONE-SALMETEROL 250-50 MCG/DOSE IN AEPB Inhalation Inhale 1 puff into the lungs every 12 (twelve) hours.      . FUROSEMIDE 40 MG PO TABS Oral Take 40 mg by mouth daily.      [name] GABAPENTIN 300 MG PO CAPS Oral Take 300 mg by mouth Nightly.      . INSULIN ASPART 100 UNIT/ML [place] SOLN Subcutaneous Inject 3-20 Units into the skin 3 (three) times daily before meals. Sliding scale    . INSULIN GLARGINE 100  UNIT/ML [place] SOLN Subcutaneous Inject 42 Units into the skin 2 (two) times daily.     [name] KETOCONAZOLE 2 % EX SHAM Topical Apply topically. Apply to scalp three times per week - let lather and sit for 5 minutes and then rinse.     [name] LISINOPRIL 40 MG PO TABS Oral Take 40 mg by mouth daily.      [name] METOPROLOL SUCCINATE ER 200 MG PO TB24 Oral Take 200 mg by mouth daily.      [name] NAPROXEN 500 MG PO TABS Oral Take 500 mg by mouth 2 (two) times daily with a meal.      . SPIRONOLACTONE 25 MG PO TABS Oral Take 25 mg by mouth daily.      . TRAMADOL HCL 50 MG PO TABS Oral Take 50 mg by mouth 2 (two) times daily as needed. Take 1-2 tablets    . TRIAMCINOLONE ACETONIDE 0.1 % EX OINT Topical Apply topically as needed.      . PEN NEEDLES 31G X 6 MM MISC  2 (two) times daily.      . NON FORMULARY  2 L. Oxygen; continuous       BP 141/71  Pulse 98  Temp(Src) 97.5 F (36.4 C) (Oral)  Resp 14  SpO2 98%  Physical Exam  Nursing note and vitals reviewed. Constitutional: She is oriented to person, place, and time.       Obese, appears fatigued uncomfortable  HENT:  Head: Normocephalic and atraumatic.  Eyes: Conjunctivae and EOM are normal. Pupils are equal, round, and reactive to light.  Neck: Normal range of motion. Neck supple. No JVD present. No tracheal deviation present. No thyromegaly present.  Cardiovascular: Normal rate, regular rhythm, normal heart sounds and intact distal pulses.  Exam reveals no gallop and no friction rub.   No murmur heard. Pulmonary/Chest: Effort normal and breath sounds normal. No stridor. No respiratory distress. She has no wheezes. She has no rales. She exhibits no tenderness.  Abdominal: Soft. Bowel sounds are normal. She exhibits no distension and no mass. There is tenderness (patient with significant tenderness over left lower quadrant, also with pain in the epigastrium). There is no rebound and no guarding.  Musculoskeletal: Normal range of motion. She exhibits no edema and no  tenderness.  Lymphadenopathy:    She has no cervical adenopathy.  Neurological: She is alert and oriented to person, place, and time. Coordination normal.  Skin: Skin is warm and dry. No rash noted. No erythema. No pallor.    ED Course  Procedures (  including critical care time)  Labs Reviewed  CBC - Abnormal; Notable for the following:    RBC 5.18 (*)    RDW 16.0 (*)    All other components within normal limits  COMPREHENSIVE METABOLIC PANEL - Abnormal; Notable for the following:    Potassium 3.3 (*)    Glucose, Bld 249 (*)    All other components within normal limits  CK TOTAL AND CKMB - Abnormal; Notable for the following:    Total CK 202 (*)    All other components within normal limits  DIFFERENTIAL  POCT I-STAT TROPONIN I  LIPASE, BLOOD  URINALYSIS, ROUTINE W REFLEX MICROSCOPIC   No results found.  Date: 01/17/2012  Rate: 105  Rhythm: normal sinus rhythm and sinus tachycardia  QRS Axis: normal  Intervals: normal  ST/T Wave abnormalities: nonspecific T wave changes  Conduction Disutrbances:none  Narrative Interpretation: t wave changes  Old EKG Reviewed: changes noted   No diagnosis found.    MDM  61 year old female with 3 days abdominal pain one day of nausea vomiting 1 day of chest pain. Patient with significant pain in her left lower quadrant concerning for possible diverticulitis. She does have a normal white count and no fever. No prior history of diverticulitis. Her EKG does show some new T-wave inversions, however chest pain has been constant since onset this morning and was associated with an episode of vomiting. Her troponin is negative her CK is elevated but looking through the chart this appears to be a chronic elevation. To have CT of abdomen and pelvis. Patient with hypertension, has been unable to take her medications. We'll give IV dose of labetalol. Will treat for pain and reassess   6:47 AM patient feeling better after pain and nausea medicine. We'll  give small fluid bolus, recheck EKG as rate has decreased and hope to try by mouth challenge if CT scan is not showing surgical issue        Kalman Drape, MD 01/17/12 [phone]

## 2012-01-17 NOTE — ED Notes (Signed)
Pt is hypertensive and reports unable to take blood pressure medications due to nausea and vomiting since Saturday.

## 2012-01-17 NOTE — ED Notes (Signed)
Patient denies pain and is resting comfortably. Patient given a second ginger ale and is tolerating well. No N/V.

## 2012-01-17 NOTE — ED Notes (Signed)
The pt has had abd pain and vomiting all day she now says she has developed chest  Pain. She is hyperventilating.  diabetic

## 2012-01-17 NOTE — ED Notes (Signed)
LAB TECHNICIAN NOTIFIED ON PENDING RESULT OF PT'S LIPASE TEST.

## 2012-01-19 LAB — URINE CULTURE
Colony Count: 70000
Culture  Setup Time: 201304161052

## 2012-01-20 NOTE — ED Notes (Signed)
+   Urine  Chart sent to [facility] office

## 2012-01-21 NOTE — ED Notes (Addendum)
Chart returned from [place] office. Prescribed Bactrim DS 1 PO BID for 7 days. Prescribed by Lorenza Evangelist PA.

## 2012-01-22 NOTE — ED Notes (Signed)
Called patient and notified them of new RX. RX called to [facility] [phone]). RX called in by Georgina Peer PFM.

## 2012-04-01 ENCOUNTER — Emergency Department (HOSPITAL_COMMUNITY): Payer: Medicare Other

## 2012-04-01 ENCOUNTER — Inpatient Hospital Stay (HOSPITAL_COMMUNITY)
Admission: EM | Admit: 2012-04-01 | Discharge: 2012-04-03 | DRG: 287 | Disposition: A | Discharge status: Home / Self Care | Payer: Medicare Other | Source: Ambulatory Visit | Attending: Cardiology | Admitting: Cardiology

## 2012-04-01 ENCOUNTER — Encounter (HOSPITAL_COMMUNITY): Payer: Self-pay | Admitting: Emergency Medicine

## 2012-04-01 DIAGNOSIS — I5023 Acute on chronic systolic (congestive) heart failure: Principal | ICD-10-CM

## 2012-04-01 DIAGNOSIS — Z7982 Long term (current) use of aspirin: Secondary | ICD-10-CM

## 2012-04-01 DIAGNOSIS — E1165 Type 2 diabetes mellitus with hyperglycemia: Secondary | ICD-10-CM

## 2012-04-01 DIAGNOSIS — R079 Chest pain, unspecified: Secondary | ICD-10-CM

## 2012-04-01 DIAGNOSIS — F17200 Nicotine dependence, unspecified, uncomplicated: Secondary | ICD-10-CM | POA: Diagnosis present

## 2012-04-01 DIAGNOSIS — R109 Unspecified abdominal pain: Secondary | ICD-10-CM

## 2012-04-01 DIAGNOSIS — Z794 Long term (current) use of insulin: Secondary | ICD-10-CM

## 2012-04-01 DIAGNOSIS — Z79899 Other long term (current) drug therapy: Secondary | ICD-10-CM

## 2012-04-01 DIAGNOSIS — E1129 Type 2 diabetes mellitus with other diabetic kidney complication: Secondary | ICD-10-CM

## 2012-04-01 DIAGNOSIS — E785 Hyperlipidemia, unspecified: Secondary | ICD-10-CM | POA: Diagnosis present

## 2012-04-01 DIAGNOSIS — E119 Type 2 diabetes mellitus without complications: Secondary | ICD-10-CM | POA: Diagnosis present

## 2012-04-01 DIAGNOSIS — I428 Other cardiomyopathies: Secondary | ICD-10-CM

## 2012-04-01 DIAGNOSIS — I10 Essential (primary) hypertension: Secondary | ICD-10-CM | POA: Diagnosis present

## 2012-04-01 DIAGNOSIS — I509 Heart failure, unspecified: Secondary | ICD-10-CM | POA: Diagnosis present

## 2012-04-01 DIAGNOSIS — Z95810 Presence of automatic (implantable) cardiac defibrillator: Secondary | ICD-10-CM

## 2012-04-01 LAB — BASIC METABOLIC PANEL
BUN: 11 mg/dL (ref 6–23)
CO2: 25 mEq/L (ref 19–32)
Calcium: 9.5 mg/dL (ref 8.4–10.5)
Chloride: 100 mEq/L (ref 96–112)
Creatinine, Ser: 0.78 mg/dL (ref 0.50–1.10)
GFR calc Af Amer: >90 mL/min (ref >90)
GFR calc non Af Amer: >90 mL/min (ref >90)
Glucose, Bld: 214 mg/dL — ABNORMAL HIGH (ref 70–99)
Potassium: 3.9 mEq/L (ref 3.5–5.1)
Sodium: 138 mEq/L (ref 135–145)

## 2012-04-01 LAB — CBC WITH DIFFERENTIAL/PLATELET
Basophils Absolute: 0 10*3/uL (ref 0.0–0.1)
Basophils Relative: 0 % (ref 0–1)
Eosinophils Absolute: 0.1 10*3/uL (ref 0.0–0.7)
Eosinophils Relative: 1 % (ref 0–5)
HCT: 42.6 % (ref 36.0–46.0)
Hemoglobin: 13.9 g/dL (ref 12.0–15.0)
Lymphocytes Relative: 17 % (ref 12–46)
Lymphs Abs: 1.5 10*3/uL (ref 0.7–4.0)
MCH: 26.8 pg (ref 26.0–34.0)
MCHC: 32.6 g/dL (ref 30.0–36.0)
MCV: 82.2 fL (ref 78.0–100.0)
Monocytes Absolute: 0.4 10*3/uL (ref 0.1–1.0)
Monocytes Relative: 5 % (ref 3–12)
Neutro Abs: 6.7 10*3/uL (ref 1.7–7.7)
Neutrophils Relative %: 77 % (ref 43–77)
Platelets: 217 10*3/uL (ref 150–400)
RBC: 5.18 MIL/uL — ABNORMAL HIGH (ref 3.87–5.11)
RDW: 16.4 % — ABNORMAL HIGH (ref 11.5–15.5)
WBC: 8.8 10*3/uL (ref 4.0–10.5)

## 2012-04-01 LAB — POCT I-STAT TROPONIN I: Troponin i, poc: 0.04 ng/mL (ref 0.00–0.08)

## 2012-04-01 LAB — PRO B NATRIURETIC PEPTIDE: Pro B Natriuretic peptide (BNP): 557.2 pg/mL — ABNORMAL HIGH (ref 0–125)

## 2012-04-01 MED ORDER — PREDNISONE 20 MG PO TABS
60.0000 mg | ORAL_TABLET | Freq: Once | ORAL | Status: AC
  Administered 2012-04-01: 60 mg via ORAL
  Filled 2012-04-01: qty 3

## 2012-04-01 MED ORDER — IPRATROPIUM BROMIDE 0.02 % IN SOLN
0.5000 mg | Freq: Once | RESPIRATORY_TRACT | Status: AC
  Administered 2012-04-01: 0.5 mg via RESPIRATORY_TRACT
  Filled 2012-04-01: qty 2.5

## 2012-04-01 MED ORDER — ALBUTEROL SULFATE (5 MG/ML) 0.5% IN NEBU
5.0000 mg | INHALATION_SOLUTION | Freq: Once | RESPIRATORY_TRACT | Status: AC
  Administered 2012-04-01: 5 mg via RESPIRATORY_TRACT
  Filled 2012-04-01: qty 1

## 2012-04-01 MED ORDER — NITROGLYCERIN 0.4 MG SL SUBL
0.4000 mg | SUBLINGUAL_TABLET | SUBLINGUAL | Status: AC | PRN
  Administered 2012-04-01 (×3): 0.4 mg via SUBLINGUAL

## 2012-04-01 MED ORDER — ASPIRIN 81 MG PO CHEW
324.0000 mg | CHEWABLE_TABLET | Freq: Once | ORAL | Status: AC
  Administered 2012-04-01: 324 mg via ORAL
  Filled 2012-04-01: qty 4

## 2012-04-01 NOTE — ED Provider Notes (Signed)
History     CSN: [id number]  Arrival date & time 04/01/12  2140   First MD Initiated Contact with Patient 04/01/12 2259      Chief Complaint  Patient presents with  . Shortness of Breath     Patient is a 61 y.o. female presenting with shortness of breath. The history is provided by the patient.  Shortness of Breath  The current episode started today. The onset was sudden. The problem occurs continuously. The problem has been gradually worsening. The problem is moderate. Nothing relieves the symptoms. Nothing aggravates the symptoms. Associated symptoms include chest pain and shortness of breath. Pertinent negatives include no fever.  Patient reports that while in church today she developed SOB.  She reports chest pain, nausea and feeling dizzy.  No syncope.  She also reports mild abdominal pain.  She reports h/o ICD but no recent shocks noted Denies fever No diarrhea No vomiting No recent rectal bleeding/blood loss  Past Medical History  Diagnosis Date  . Diabetes mellitus   . CHF (congestive heart failure)     Past Surgical History  Procedure Date  . Cardiac defibrillator placement     No family history on file.  History  Substance Use Topics  . Smoking status: Current Everyday Smoker  . Smokeless tobacco: Not on file  . Alcohol Use: Yes    OB History    Grav Para Term Preterm Abortions TAB SAB Ect Mult Living                  Review of Systems  Constitutional: Negative for fever.  Respiratory: Positive for shortness of breath.   Cardiovascular: Positive for chest pain.  Gastrointestinal: Positive for abdominal pain.  All other systems reviewed and are negative.    Allergies  Review of patient's allergies indicates no known allergies.  Home Medications  No current outpatient prescriptions on file.  BP 175/99  Pulse 87  Temp 99.1 F (37.3 C) (Oral)  Resp 32  SpO2 99% BP 183/98  Pulse 90  Temp 99.1 F (37.3 C) (Oral)  Resp 23  SpO2  97%   Physical Exam CONSTITUTIONAL: Well developed/well nourished HEAD AND FACE: Normocephalic/atraumatic EYES: EOMI/PERRL ENMT: Mucous membranes moist NECK: supple no meningeal signs SPINE:entire spine nontender CV: S1/S2 noted, no murmurs/rubs/gallops noted LUNGS: coarse BS noted bilaterally with some wheeze.  Mild tachypnea noted ABDOMEN: soft, nontender, no rebound or guarding GU:no cva tenderness NEURO: Pt is awake/alert, moves all extremitiesx4 EXTREMITIES: pulses normal, full ROM SKIN: warm, color normal PSYCH: anxious  ED Course  Procedures    Labs Reviewed  CBC WITH DIFFERENTIAL  BASIC METABOLIC PANEL  PRO B NATRIURETIC PEPTIDE   11:18 PM Pt with h/o CHF, having SOB with wheeze, bnp ordered to evaluate for CHF 12:27 AM Pt with continued cp, hypertensive, not responding to NTG Will call cardiology for evaluation 12:46 AM D/w dr Inocente Salles turner will admit to tele for chest pain/chf Pt stabilized in the ED  MDM  Nursing notes including past medical history and social history reviewed and considered in documentation xrays reviewed and considered All labs/vitals reviewed and considered Previous records reviewed and considered - h/o cardiomyopathy       Date: 04/01/2012  Rate: 93  Rhythm: normal sinus rhythm  QRS Axis: normal  Intervals: normal  ST/T Wave abnormalities: nonspecific ST changes  Conduction Disutrbances:none  Narrative Interpretation:   Old EKG Reviewed: none available at time of interpretation    Sharyon Cable, MD 04/02/12 [phone]

## 2012-04-01 NOTE — ED Notes (Addendum)
C/o sob, mild chest pain, nausea, and vomiting since 12:30pm today while at church.  Labored breathing on arrival.

## 2012-04-02 ENCOUNTER — Encounter (HOSPITAL_COMMUNITY)
Admission: EM | Disposition: A | Discharge status: Home / Self Care | Payer: Self-pay | Source: Ambulatory Visit | Attending: Cardiology

## 2012-04-02 ENCOUNTER — Encounter (HOSPITAL_COMMUNITY): Payer: Self-pay | Admitting: *Deleted

## 2012-04-02 DIAGNOSIS — R079 Chest pain, unspecified: Secondary | ICD-10-CM

## 2012-04-02 DIAGNOSIS — R109 Unspecified abdominal pain: Secondary | ICD-10-CM

## 2012-04-02 DIAGNOSIS — R0602 Shortness of breath: Secondary | ICD-10-CM

## 2012-04-02 HISTORY — PX: LEFT HEART CATHETERIZATION WITH CORONARY ANGIOGRAM: SHX5451

## 2012-04-02 LAB — CBC
HCT: 43 % (ref 36.0–46.0)
Hemoglobin: 13.7 g/dL (ref 12.0–15.0)
MCH: 26.5 pg (ref 26.0–34.0)
MCHC: 31.9 g/dL (ref 30.0–36.0)
MCV: 83.2 fL (ref 78.0–100.0)
Platelets: 244 10*3/uL (ref 150–400)
RBC: 5.17 MIL/uL — ABNORMAL HIGH (ref 3.87–5.11)
RDW: 16.4 % — ABNORMAL HIGH (ref 11.5–15.5)
WBC: 9.5 10*3/uL (ref 4.0–10.5)

## 2012-04-02 LAB — COMPREHENSIVE METABOLIC PANEL
ALT: 27 U/L (ref 0–35)
AST: 25 U/L (ref 0–37)
Albumin: 4.3 g/dL (ref 3.5–5.2)
Alkaline Phosphatase: 152 U/L — ABNORMAL HIGH (ref 39–117)
BUN: 15 mg/dL (ref 6–23)
CO2: 28 mEq/L (ref 19–32)
Calcium: 9.7 mg/dL (ref 8.4–10.5)
Chloride: 97 mEq/L (ref 96–112)
Creatinine, Ser: 0.81 mg/dL (ref 0.50–1.10)
GFR calc Af Amer: >90 mL/min (ref >90)
GFR calc non Af Amer: 79 mL/min — ABNORMAL LOW (ref >90)
Glucose, Bld: 302 mg/dL — ABNORMAL HIGH (ref 70–99)
Potassium: 4.2 mEq/L (ref 3.5–5.1)
Sodium: 137 mEq/L (ref 135–145)
Total Bilirubin: 0.4 mg/dL (ref 0.3–1.2)
Total Protein: 7.7 g/dL (ref 6.0–8.3)

## 2012-04-02 LAB — PROTIME-INR
INR: 1.18 (ref 0.00–1.49)
Prothrombin Time: 15.3 seconds — ABNORMAL HIGH (ref 11.6–15.2)

## 2012-04-02 LAB — CARDIAC PANEL(CRET KIN+CKTOT+MB+TROPI)
CK, MB: 3.6 ng/mL (ref 0.3–4.0)
CK, MB: 3.5 ng/mL (ref 0.3–4.0)
CK, MB: 4.2 ng/mL — ABNORMAL HIGH (ref 0.3–4.0)
Relative Index: 2.3 (ref 0.0–2.5)
Relative Index: 2.4 (ref 0.0–2.5)
Relative Index: 2.7 — ABNORMAL HIGH (ref 0.0–2.5)
Total CK: 159 U/L (ref 7–177)
Total CK: 146 U/L (ref 7–177)
Total CK: 157 U/L (ref 7–177)
Troponin I: <0.3 ng/mL (ref <0.30)
Troponin I: <0.3 ng/mL (ref <0.30)
Troponin I: <0.3 ng/mL (ref <0.30)

## 2012-04-02 LAB — HEPATIC FUNCTION PANEL
ALT: 36 U/L — ABNORMAL HIGH (ref 0–35)
AST: 36 U/L (ref 0–37)
Albumin: 4.3 g/dL (ref 3.5–5.2)
Alkaline Phosphatase: 144 U/L — ABNORMAL HIGH (ref 39–117)
Bilirubin, Direct: 0.1 mg/dL (ref 0.0–0.3)
Indirect Bilirubin: 0.4 mg/dL (ref 0.3–0.9)
Total Bilirubin: 0.5 mg/dL (ref 0.3–1.2)
Total Protein: 7.6 g/dL (ref 6.0–8.3)

## 2012-04-02 LAB — LIPASE, BLOOD
Lipase: 31 U/L (ref 11–59)
Lipase: 23 U/L (ref 11–59)

## 2012-04-02 LAB — TSH: TSH: 0.644 u[IU]/mL (ref 0.350–4.500)

## 2012-04-02 LAB — HEMOGLOBIN A1C
Hgb A1c MFr Bld: 9.2 % — ABNORMAL HIGH (ref <5.7)
Mean Plasma Glucose: 217 mg/dL — ABNORMAL HIGH (ref <117)

## 2012-04-02 LAB — GLUCOSE, CAPILLARY
Glucose-Capillary: 281 mg/dL — ABNORMAL HIGH (ref 70–99)
Glucose-Capillary: 285 mg/dL — ABNORMAL HIGH (ref 70–99)
Glucose-Capillary: 120 mg/dL — ABNORMAL HIGH (ref 70–99)
Glucose-Capillary: 118 mg/dL — ABNORMAL HIGH (ref 70–99)
Glucose-Capillary: 188 mg/dL — ABNORMAL HIGH (ref 70–99)

## 2012-04-02 LAB — POCT ACTIVATED CLOTTING TIME: Activated Clotting Time: 100 seconds

## 2012-04-02 LAB — MAGNESIUM: Magnesium: 1.8 mg/dL (ref 1.5–2.5)

## 2012-04-02 LAB — D-DIMER, QUANTITATIVE: D-Dimer, Quant: <0.22 ug/mL-FEU (ref 0.00–0.48)

## 2012-04-02 SURGERY — LEFT HEART CATHETERIZATION WITH CORONARY ANGIOGRAM
Anesthesia: LOCAL

## 2012-04-02 MED ORDER — INSULIN ASPART 100 UNIT/ML ~~LOC~~ SOLN
0.0000 [IU] | Freq: Every day | SUBCUTANEOUS | Status: DC
Start: 2012-04-02 — End: 2012-04-03

## 2012-04-02 MED ORDER — HEPARIN (PORCINE) IN NACL 2-0.9 UNIT/ML-% IJ SOLN
INTRAMUSCULAR | Status: AC
  Filled 2012-04-02: qty 1000

## 2012-04-02 MED ORDER — INSULIN ASPART 100 UNIT/ML ~~LOC~~ SOLN
0.0000 [IU] | Freq: Three times a day (TID) | SUBCUTANEOUS | Status: DC
  Administered 2012-04-02: 13:00:00 via SUBCUTANEOUS
  Administered 2012-04-02: 11 [IU] via SUBCUTANEOUS
  Administered 2012-04-03: 3 [IU] via SUBCUTANEOUS
  Administered 2012-04-03: 11 [IU] via SUBCUTANEOUS

## 2012-04-02 MED ORDER — HEPARIN SODIUM (PORCINE) 5000 UNIT/ML IJ SOLN
5000.0000 [IU] | Freq: Three times a day (TID) | INTRAMUSCULAR | Status: DC
  Filled 2012-04-02 (×3): qty 1

## 2012-04-02 MED ORDER — METOPROLOL TARTRATE 1 MG/ML IV SOLN
INTRAVENOUS | Status: AC
  Filled 2012-04-02: qty 5

## 2012-04-02 MED ORDER — INSULIN GLARGINE 100 UNIT/ML ~~LOC~~ SOLN
20.0000 [IU] | Freq: Every day | SUBCUTANEOUS | Status: DC
  Administered 2012-04-02 (×2): 20 [IU] via SUBCUTANEOUS

## 2012-04-02 MED ORDER — SODIUM CHLORIDE 0.9 % IV SOLN: INTRAVENOUS | Status: AC

## 2012-04-02 MED ORDER — SODIUM CHLORIDE 0.9 % IJ SOLN
3.0000 mL | INTRAMUSCULAR | Status: DC | PRN
  Administered 2012-04-02: 3 mL via INTRAVENOUS

## 2012-04-02 MED ORDER — HEPARIN BOLUS VIA INFUSION
4000.0000 [IU] | Freq: Once | INTRAVENOUS | Status: AC
  Administered 2012-04-02: 4000 [IU] via INTRAVENOUS
  Filled 2012-04-02: qty 4000

## 2012-04-02 MED ORDER — LISINOPRIL 20 MG PO TABS
20.0000 mg | ORAL_TABLET | Freq: Every day | ORAL | Status: DC
  Administered 2012-04-02: 20 mg via ORAL
  Filled 2012-04-02 (×2): qty 1

## 2012-04-02 MED ORDER — NITROGLYCERIN 0.4 MG SL SUBL: 0.4000 mg | SUBLINGUAL_TABLET | SUBLINGUAL | Status: DC | PRN

## 2012-04-02 MED ORDER — NITROGLYCERIN 0.4 MG SL SUBL
SUBLINGUAL_TABLET | SUBLINGUAL | Status: AC
  Administered 2012-04-02: 08:00:00
  Filled 2012-04-02: qty 25

## 2012-04-02 MED ORDER — FUROSEMIDE 10 MG/ML IJ SOLN
40.0000 mg | Freq: Once | INTRAMUSCULAR | Status: AC
  Administered 2012-04-02: 40 mg via INTRAVENOUS
  Filled 2012-04-02: qty 4

## 2012-04-02 MED ORDER — ASPIRIN 81 MG PO CHEW: 324.0000 mg | CHEWABLE_TABLET | ORAL | Status: DC

## 2012-04-02 MED ORDER — MORPHINE SULFATE 2 MG/ML IJ SOLN
2.0000 mg | INTRAMUSCULAR | Status: DC | PRN
Start: 2012-04-02 — End: 2012-04-03

## 2012-04-02 MED ORDER — LIDOCAINE-EPINEPHRINE 1 %-1:100000 IJ SOLN
3.0000 mL | Freq: Once | INTRAMUSCULAR | Status: AC
  Administered 2012-04-02: 3 mL via INTRADERMAL

## 2012-04-02 MED ORDER — ASPIRIN EC 81 MG PO TBEC
81.0000 mg | DELAYED_RELEASE_TABLET | Freq: Every day | ORAL | Status: DC
  Administered 2012-04-02 – 2012-04-03 (×2): 81 mg via ORAL
  Filled 2012-04-02 (×2): qty 1

## 2012-04-02 MED ORDER — LABETALOL HCL 5 MG/ML IV SOLN
INTRAVENOUS | Status: AC
  Filled 2012-04-02: qty 4

## 2012-04-02 MED ORDER — METOPROLOL SUCCINATE ER 25 MG PO TB24
25.0000 mg | ORAL_TABLET | Freq: Every day | ORAL | Status: DC
  Administered 2012-04-02: 25 mg via ORAL
  Filled 2012-04-02 (×2): qty 1

## 2012-04-02 MED ORDER — SODIUM CHLORIDE 0.9 % IV SOLN: 250.0000 mL | INTRAVENOUS | Status: DC | PRN

## 2012-04-02 MED ORDER — ACETAMINOPHEN 325 MG PO TABS: 650.0000 mg | ORAL_TABLET | ORAL | Status: DC | PRN

## 2012-04-02 MED ORDER — LIDOCAINE HCL (PF) 1 % IJ SOLN
INTRAMUSCULAR | Status: AC
  Filled 2012-04-02: qty 30

## 2012-04-02 MED ORDER — HYDRALAZINE HCL 20 MG/ML IJ SOLN
INTRAMUSCULAR | Status: AC
  Filled 2012-04-02: qty 1

## 2012-04-02 MED ORDER — MORPHINE SULFATE 4 MG/ML IJ SOLN
4.0000 mg | Freq: Once | INTRAMUSCULAR | Status: AC
  Administered 2012-04-02: 4 mg via INTRAVENOUS
  Filled 2012-04-02: qty 2

## 2012-04-02 MED ORDER — NITROGLYCERIN 0.2 MG/ML ON CALL CATH LAB
INTRAVENOUS | Status: AC
  Filled 2012-04-02: qty 1

## 2012-04-02 MED ORDER — HYDRALAZINE HCL 20 MG/ML IJ SOLN
10.0000 mg | Freq: Once | INTRAMUSCULAR | Status: AC
  Administered 2012-04-02: 10 mg via INTRAVENOUS

## 2012-04-02 MED ORDER — ONDANSETRON HCL 4 MG/2ML IJ SOLN: 4.0000 mg | Freq: Four times a day (QID) | INTRAMUSCULAR | Status: DC | PRN

## 2012-04-02 MED ORDER — SODIUM CHLORIDE 0.9 % IJ SOLN
3.0000 mL | Freq: Two times a day (BID) | INTRAMUSCULAR | Status: DC
  Administered 2012-04-02 – 2012-04-03 (×3): 3 mL via INTRAVENOUS

## 2012-04-02 MED ORDER — HYDROMORPHONE HCL PF 1 MG/ML IJ SOLN
1.0000 mg | Freq: Once | INTRAMUSCULAR | Status: AC
  Administered 2012-04-02: 1 mg via INTRAVENOUS
  Filled 2012-04-02: qty 1

## 2012-04-02 MED ORDER — HEPARIN (PORCINE) IN NACL 100-0.45 UNIT/ML-% IJ SOLN
1000.0000 [IU]/h | INTRAMUSCULAR | Status: DC
  Administered 2012-04-02: 1000 [IU]/h via INTRAVENOUS
  Filled 2012-04-02: qty 250

## 2012-04-02 MED ORDER — LIDOCAINE-EPINEPHRINE 1 %-1:100000 IJ SOLN
INTRAMUSCULAR | Status: AC
  Filled 2012-04-02: qty 1

## 2012-04-02 NOTE — Progress Notes (Signed)
Pt reports naproxen 500mg  tablet is missing from medication list and metoprolol ER Succinate is a 200mg  tablet.

## 2012-04-02 NOTE — H&P (View-Only) (Signed)
Patient: [name] Date of Encounter: 04/02/2012, 8:44 AM Admit date: 04/01/2012     Subjective  61 y/o F with 2 MRNs with hx of NICM, HTN, HL, DM - last myoview 2008 no ischemia, soft tissue attenuation, EF 33%. Echo 12/07 EF 35-40%, most reecnt echo June of 2012 showed an ejection fraction of 50-55%, mild left atrial enlargement and mild aortic insufficiency.   Had recurrent episode of CP 7/10 this AM. 1 SL NTG completely and quickly resolved the pain. EKG demonstrated more pronounced anterolateral TWI than prior EKG. QTc also prolonged at 547 (QRS 88). Also has a component of chest tenderness to palpitation but this is somewhat different than pain she had earlier. Prior to the episode of CP this AM, her 1 dose of IV Lasix had helped her SOB/pressure.   Objective   Telemetry: NSR Physical Exam: Filed Vitals:   04/02/12 0621  BP: 124/48  Pulse: 74  Temp: 97.7 F (36.5 C)  Resp: 20   General: Well developed obese AAF in no acute distress. Head: Normocephalic, atraumatic, sclera non-icteric, no xanthomas, nares are without discharge.  Neck: Negative for carotid bruits. JVD not elevated. Lungs: Clear bilaterally to auscultation without wheezes, rales, or rhonchi. Breathing is unlabored. Heart: RRR S1 S2 without murmurs, rubs, or gallops.  Abdomen: Soft, non-tender, non-distended with normoactive bowel sounds. No hepatomegaly. No rebound/guarding. No obvious abdominal masses. Msk:  Strength and tone appear normal for age. Extremities: No clubbing or cyanosis. Tr soft edema. Distal pedal pulses are 2+ and equal bilaterally. Neuro: Alert and oriented X 3. Moves all extremities spontaneously. Psych:  Responds to questions appropriately with a normal affect.    Intake/Output Summary (Last 24 hours) at 04/02/12 0844 Last data filed at 04/02/12 0843  Gross per 24 hour  Intake    480 ml  Output    750 ml  Net   -270 ml    Inpatient Medications:    . albuterol  5 mg Nebulization Once   . aspirin  324 mg Oral Once  . aspirin EC  81 mg Oral Daily  . furosemide  40 mg Intravenous Once  . heparin  5,000 Units Subcutaneous Q8H  .  HYDROmorphone (DILAUDID) injection  1 mg Intravenous Once  . insulin aspart  0-20 Units Subcutaneous TID WC  . insulin aspart  0-5 Units Subcutaneous QHS  . insulin glargine  20 Units Subcutaneous QHS  . ipratropium  0.5 mg Nebulization Once  . lisinopril  20 mg Oral Daily  . metoprolol succinate  25 mg Oral Daily  .  morphine injection  4 mg Intravenous Once  . nitroGLYCERIN      . predniSONE  60 mg Oral Once  . sodium chloride  3 mL Intravenous Q12H    Labs:  Basename 04/02/12 0542 04/01/12 2316  NA 137 138  K 4.2 3.9  CL 97 100  CO2 28 25  GLUCOSE 302* 214*  BUN 15 11  CREATININE 0.81 0.78  CALCIUM 9.7 9.5  MG -- --  PHOS -- --    Basename 04/02/12 0542 04/01/12 2316  AST 25 36  ALT 27 36*  ALKPHOS 152* 144*  BILITOT 0.4 0.5  PROT 7.7 7.6  ALBUMIN 4.3 4.3    Basename 04/02/12 0542 04/01/12 2316  LIPASE 23 31  AMYLASE -- --    Basename 04/02/12 0542 04/01/12 2316  WBC 9.5 8.8  NEUTROABS -- 6.7  HGB 13.7 13.9  HCT 43.0 42.6  MCV 83.2 82.2  PLT 244 217    Basename 04/02/12 0542  CKTOTAL 159  CKMB 3.6  TROPONINI <0.30    Radiology/Studies:  Dg Chest Port 1 View 04/01/2012  *RADIOLOGY REPORT*  Clinical Data: Shortness of breath, wheezing.  PORTABLE CHEST - 1 VIEW  Comparison: None  Findings: Left AICD remains in place, unchanged.  Cardiomegaly.  No confluent airspace opacities, effusions or edema.  No acute bony abnormality.  IMPRESSION: Cardiomegaly.  No acute findings.  Original Report Authenticated By: Raelyn Number, M.D.    Assessment and Plan   1. CP responsive to NTG, neg enzymes thus far but EKG changes worrisome for [place]. Discussed with Dr. Mare Ferrari who also saw pt - will keep NPO for now and plan for left heart cath this afternoon (ate breakfast). Risks/benefits/alternatives discussed with pt and she  agrees to proceed. Start IV heparin per pharmacy.  2. SOB felt secondary to mild acute diastolic CHF exacerbation in setting of hx of NICM (EF previously low but most recently 50-55% in 2012) - 2D echo pending. Received 1 dose of IV Lasix early this AM with some improvement in SOB. Follow clinically. 3. QTc prolongation - home meds not yet clarified by pharmacy but no current meds that would appear to contribute. Check Mg. 4. HTN with ?BP with pain - currently controlled. 5. Diabetes mellitus - con't SSI.   Signed, Melina Copa PA-C  Patient seen and examined on 4700.  Had substernal chest pain this am and her EKG is worse with significant anterolateral new T wave inversion. Will start IV heparin and plan on left heart cath later today (has eaten breakfast).

## 2012-04-02 NOTE — CV Procedure (Signed)
   Cardiac Catheterization Procedure Note  Name: [name] MRN: [id number] DOB: Feb 02, 1951  Procedure: Left Heart Cath, Selective Coronary Angiography, LV angiography  Indication: chest pain, abnormal ECG   Procedural details: The right groin was prepped, draped, and anesthetized with 1% lidocaine. Using modified Seldinger technique, a 4 French sheath was introduced into the right femoral artery. Standard Judkins catheters were used for coronary angiography and left ventriculography. Catheter exchanges were performed over a guidewire. There were no immediate procedural complications. The patient was transferred to the post catheterization recovery area for further monitoring.  BP was elevated.  She was given IV labetelol and IV metoprolol.  There are discrepancies in her medications from home  (Toprol dose).  She may need adjustment of this.    Procedural Findings: Hemodynamics:  AO 187/105 (138) LV 167/27 NO gradient.   Coronary angiography: Coronary dominance: right  Left mainstem: Mild calcification.  Large caliber.  No obstruction  Left anterior descending (LAD): Provides a large diagonal and is smooth and free of disease.   Left circumflex (LCx): Provides an intermediate and three marginals, one of which is large.  No obstruction was noted.    Right coronary artery (RCA): Large in caliber providing a large PDA, smaller PLA.  No obstruction  Left ventriculography: Left ventricular systolic function is mildly reduced, with mild global hypokinesis.  EF does not appear less than 45%  Final Conclusions:   1.  No significant coronary obstruction 2.  Mildly reduced overall EF with global hypokinesis   Recommendations:  1.  Would check d-dimer.  BP needs to be straightened out as to what she is really taking--pt does not seem to know.    [name] 04/02/2012, 3:59 PM

## 2012-04-02 NOTE — Interval H&P Note (Signed)
History and Physical Interval Note:  04/02/2012 2:41 PM  [name]  has presented today for surgery, with the diagnosis of chest pain  The various methods of treatment have been discussed with the patient. After consideration of risks, benefits and other options for treatment, the patient has consented to  Procedure(s) (LRB): LEFT HEART CATHETERIZATION WITH CORONARY ANGIOGRAM (N/A) as a surgical intervention .  The patient's history has been reviewed, patient examined, no change in status, stable for surgery.  I have reviewed the patients' chart and labs.  Questions were answered to the patient's satisfaction.     [name]

## 2012-04-02 NOTE — Progress Notes (Signed)
Nursing Note: Pt is complaining of chest pain 7 out of 10. Pt stated it is "ongoing and feels like pressure". MD made aware. Orders received to get an EKG, give Nitro Sublingual if pain does not subside give Morphine 2 mg IV. Pt stated pain level 7 out of 10. EKG done Normal Sinus Rhythm. Pt given one sublingual Nitro blood pressure 145/62 heart rate 82 and oxygen level 99% on 2 liters. 5 minutes later pt stated pain level decreased to 0 out of 10. Pt's vitals taken again, 133/65, and heart rate of 85 oxygen level 99% on 2 liters. Pt stable with no other complaints at this time.Will continue to monitor pt.  Damontae Loppnow Scientific laboratory technician.

## 2012-04-02 NOTE — H&P (Signed)
NOTE: Records have not been merged - so prior data from [place] and [facility] are unavailable.   Primary Cardiologist: Stanford Breed  CC:  Abdominal pain and shortness of breath  History of Present Illness: [name] is a 61 y.o. female with a past medical history significant for cardiomyopathy s/p ICD (uknown EF or ischemic vs. Non-ischemic).  The patient reports being cathed and no stents were placed.  She had been in her usual state of health until last night when she felt constipated and took a laxative.  She woke up on Sunday morning and had cramping in her lower abdomen.  She went to church and then had fried chicken, ribs, and potato salad for lunch.  After eating this, the pain in her lower abdomen radiated to her left flank.  Her BP rose (SBP 190s) and she became short of breath and came to the ED for evaluation.  In the ED, they gave her ASA, dilaudid, NTG, and nebs and consulted cardiology.    PMH: 1. Cardiomyopathy (? Nonischemic) s/p single lead ICD 2. Diabetes  SOCIAL HISTORY: The patient actively uses tobacco.  She is on disability.    FAMILY HISTORY: There is no family history of cardiomyopathy or ICD.   REVIEW OF SYSTEMS: All systems reviewed and are negative except as mentioned above in the HPI.    MEDICATIONS: Patient is unsure of all her medications.  We will need pharmacy to confirm: 1. ASA 81mg  daily 2. Toprol XL 25mg  daily 3. Lisinopril 20mg  daily 4. Lasix 40mg  daily 5. Lantus 42 units BID 6. Humulin SSI   ALLERGY: No Known Allergies   PHYSICAL EXAMINATION: Filed Vitals:   04/02/12 0200  BP: 122/79  Pulse: 83  Temp:   Resp: 18     GENERAL: well developed, well nourished, no acute distress EYES: PERRL, EOMI ENT: Oropharynx is clear.  Dentition is within normal limits NECK:  There is no appreciable JVD, or carotid bruits. No masses HEART: RRR with no murmurs, rubs, or gallops LUNGS: Faint bibasilar crackles bilaterally ABDOMEN:  +BS, soft, NTND EXTREMITIES:   No clubbing, cyanosis, or edema. PULSES:   2+ DP B.  NEUROLOGIC: AOx3, no focal deficits SKIN: No rashes PSYCH:  Normal judgment and insight, mood is appropriate.  EKG: 2145 - SR with BAE and nonspecific T-waves changes laterally  Results for orders placed during the hospital encounter of 04/01/12 (from the past 24 hour(s))  CBC WITH DIFFERENTIAL     Status: Abnormal   Collection Time   04/01/12 11:16 PM      Component Value Range   WBC 8.8  4.0 - 10.5 K/uL   RBC 5.18 (*) 3.87 - 5.11 MIL/uL   Hemoglobin 13.9  12.0 - 15.0 g/dL   HCT 42.6  36.0 - 46.0 %   MCV 82.2  78.0 - 100.0 fL   MCH 26.8  26.0 - 34.0 pg   MCHC 32.6  30.0 - 36.0 g/dL   RDW 16.4 (*) 11.5 - 15.5 %   Platelets 217  150 - 400 K/uL   Neutrophils Relative 77  43 - 77 %   Neutro Abs 6.7  1.7 - 7.7 K/uL   Lymphocytes Relative 17  12 - 46 %   Lymphs Abs 1.5  0.7 - 4.0 K/uL   Monocytes Relative 5  3 - 12 %   Monocytes Absolute 0.4  0.1 - 1.0 K/uL   Eosinophils Relative 1  0 - 5 %   Eosinophils Absolute 0.1  0.0 -  0.7 K/uL   Basophils Relative 0  0 - 1 %   Basophils Absolute 0.0  0.0 - 0.1 K/uL  BASIC METABOLIC PANEL     Status: Abnormal   Collection Time   04/01/12 11:16 PM      Component Value Range   Sodium 138  135 - 145 mEq/L   Potassium 3.9  3.5 - 5.1 mEq/L   Chloride 100  96 - 112 mEq/L   CO2 25  19 - 32 mEq/L   Glucose, Bld 214 (*) 70 - 99 mg/dL   BUN 11  6 - 23 mg/dL   Creatinine, Ser 0.78  0.50 - 1.10 mg/dL   Calcium 9.5  8.4 - 10.5 mg/dL   GFR calc non Af Amer >90  >90 mL/min   GFR calc Af Amer >90  >90 mL/min  PRO B NATRIURETIC PEPTIDE     Status: Abnormal   Collection Time   04/01/12 11:17 PM      Component Value Range   Pro B Natriuretic peptide (BNP) 557.2 (*) 0 - 125 pg/mL  POCT I-STAT TROPONIN I     Status: Normal   Collection Time   04/01/12 11:27 PM      Component Value Range   Troponin i, poc 0.04  0.00 - 0.08 ng/mL   Comment 3            Dg Chest Port 1 View  04/01/2012  *RADIOLOGY  REPORT*  Clinical Data: Shortness of breath, wheezing.  PORTABLE CHEST - 1 VIEW  Comparison: None  Findings: Left AICD remains in place, unchanged.  Cardiomegaly.  No confluent airspace opacities, effusions or edema.  No acute bony abnormality.  IMPRESSION: Cardiomegaly.  No acute findings.  Original Report Authenticated By: Raelyn Number, M.D.    ASSESSMENT AND PLAN: [name] with PMH of cardiomyopathy s/p ICD who presents with abdominal pain and shortness of breath due to a mild acute heart failure exacerbation.   1: Admit to [facility]  2: Abdominal pain - unclear etiology - unlikely cardiac - will check hepatic function panel and lipase level.  If pain persists, she may need a CT scan.    3: Shortness of breath - I suspect this is an acute mild diastolic heart failure exacerbation.  I suspect that when her BP increased (in response to abdominal pain), it caused her afterload to increase ultimately resulting in increased left atrial pressure and pulmonary edema.  Will give lasix 40mg  IV x 1, then reassess in the morning.  In the interim, continue home HF medication listed above.  Check TTE. Cycle CE.   4: FEN: SLIVF, Electrolytes are stable, NPO after midnight.    5: DVT prophylaxis: sq heparin.  Shaune Pollack. Radford Pax, MD Level 3 H&P

## 2012-04-02 NOTE — Progress Notes (Signed)
Patient's husband to bring in medication list to clarify dosage of Toprol.

## 2012-04-02 NOTE — Progress Notes (Signed)
Patient: [name] Date of Encounter: 04/02/2012, 8:44 AM Admit date: 04/01/2012     Subjective  61 y/o F with 2 MRNs with hx of NICM, HTN, HL, DM - last myoview 2008 no ischemia, soft tissue attenuation, EF 33%. Echo 12/07 EF 35-40%, most reecnt echo June of 2012 showed an ejection fraction of 50-55%, mild left atrial enlargement and mild aortic insufficiency.   Had recurrent episode of CP 7/10 this AM. 1 SL NTG completely and quickly resolved the pain. EKG demonstrated more pronounced anterolateral TWI than prior EKG. QTc also prolonged at 547 (QRS 88). Also has a component of chest tenderness to palpitation but this is somewhat different than pain she had earlier. Prior to the episode of CP this AM, her 1 dose of IV Lasix had helped her SOB/pressure.   Objective   Telemetry: NSR Physical Exam: Filed Vitals:   04/02/12 0621  BP: 124/48  Pulse: 74  Temp: 97.7 F (36.5 C)  Resp: 20   General: Well developed obese AAF in no acute distress. Head: Normocephalic, atraumatic, sclera non-icteric, no xanthomas, nares are without discharge.  Neck: Negative for carotid bruits. JVD not elevated. Lungs: Clear bilaterally to auscultation without wheezes, rales, or rhonchi. Breathing is unlabored. Heart: RRR S1 S2 without murmurs, rubs, or gallops.  Abdomen: Soft, non-tender, non-distended with normoactive bowel sounds. No hepatomegaly. No rebound/guarding. No obvious abdominal masses. Msk:  Strength and tone appear normal for age. Extremities: No clubbing or cyanosis. Tr soft edema. Distal pedal pulses are 2+ and equal bilaterally. Neuro: Alert and oriented X 3. Moves all extremities spontaneously. Psych:  Responds to questions appropriately with a normal affect.    Intake/Output Summary (Last 24 hours) at 04/02/12 0844 Last data filed at 04/02/12 0843  Gross per 24 hour  Intake    480 ml  Output    750 ml  Net   -270 ml    Inpatient Medications:    . albuterol  5 mg Nebulization Once   . aspirin  324 mg Oral Once  . aspirin EC  81 mg Oral Daily  . furosemide  40 mg Intravenous Once  . heparin  5,000 Units Subcutaneous Q8H  .  HYDROmorphone (DILAUDID) injection  1 mg Intravenous Once  . insulin aspart  0-20 Units Subcutaneous TID WC  . insulin aspart  0-5 Units Subcutaneous QHS  . insulin glargine  20 Units Subcutaneous QHS  . ipratropium  0.5 mg Nebulization Once  . lisinopril  20 mg Oral Daily  . metoprolol succinate  25 mg Oral Daily  .  morphine injection  4 mg Intravenous Once  . nitroGLYCERIN      . predniSONE  60 mg Oral Once  . sodium chloride  3 mL Intravenous Q12H    Labs:  Basename 04/02/12 0542 04/01/12 2316  NA 137 138  K 4.2 3.9  CL 97 100  CO2 28 25  GLUCOSE 302* 214*  BUN 15 11  CREATININE 0.81 0.78  CALCIUM 9.7 9.5  MG -- --  PHOS -- --    Basename 04/02/12 0542 04/01/12 2316  AST 25 36  ALT 27 36*  ALKPHOS 152* 144*  BILITOT 0.4 0.5  PROT 7.7 7.6  ALBUMIN 4.3 4.3    Basename 04/02/12 0542 04/01/12 2316  LIPASE 23 31  AMYLASE -- --    Basename 04/02/12 0542 04/01/12 2316  WBC 9.5 8.8  NEUTROABS -- 6.7  HGB 13.7 13.9  HCT 43.0 42.6  MCV 83.2 82.2  PLT 244 217    Basename 04/02/12 0542  CKTOTAL 159  CKMB 3.6  TROPONINI <0.30    Radiology/Studies:  Dg Chest Port 1 View 04/01/2012  *RADIOLOGY REPORT*  Clinical Data: Shortness of breath, wheezing.  PORTABLE CHEST - 1 VIEW  Comparison: None  Findings: Left AICD remains in place, unchanged.  Cardiomegaly.  No confluent airspace opacities, effusions or edema.  No acute bony abnormality.  IMPRESSION: Cardiomegaly.  No acute findings.  Original Report Authenticated By: Raelyn Number, M.D.    Assessment and Plan   1. CP responsive to NTG, neg enzymes thus far but EKG changes worrisome for [place]. Discussed with Dr. Mare Ferrari who also saw pt - will keep NPO for now and plan for left heart cath this afternoon (ate breakfast). Risks/benefits/alternatives discussed with pt and she  agrees to proceed. Start IV heparin per pharmacy.  2. SOB felt secondary to mild acute diastolic CHF exacerbation in setting of hx of NICM (EF previously low but most recently 50-55% in 2012) - 2D echo pending. Received 1 dose of IV Lasix early this AM with some improvement in SOB. Follow clinically. 3. QTc prolongation - home meds not yet clarified by pharmacy but no current meds that would appear to contribute. Check Mg. 4. HTN with ?BP with pain - currently controlled. 5. Diabetes mellitus - con't SSI.   Signed, Melina Copa PA-C  Patient seen and examined on 4700.  Had substernal chest pain this am and her EKG is worse with significant anterolateral new T wave inversion. Will start IV heparin and plan on left heart cath later today (has eaten breakfast).

## 2012-04-02 NOTE — Progress Notes (Signed)
ANTICOAGULATION CONSULT NOTE - Initial Consult  Pharmacy Consult for heparin Indication: chest pain/ACS  No Known Allergies  Patient Measurements: Height: 5' (152.4 cm) Weight: 239 lb 14.1 oz (108.81 kg) IBW/kg (Calculated) : 45.5  Heparin Dosing Weight: 70  Vital Signs: Temp: 97.7 F (36.5 C) (07/01 0621) Temp src: Oral (07/01 0621) BP: 124/48 mmHg (07/01 0621) Pulse Rate: 74  (07/01 0621)  Labs:  Basename 04/02/12 0542 04/01/12 2316  HGB 13.7 13.9  HCT 43.0 42.6  PLT 244 217  APTT -- --  LABPROT -- --  INR -- --  HEPARINUNFRC -- --  CREATININE 0.81 0.78  CKTOTAL 159 --  CKMB 3.6 --  TROPONINI <0.30 --    Estimated Creatinine Clearance: 84.6 ml/min (by C-G formula based on Cr of 0.81).   Medical History: Past Medical History  Diagnosis Date  . Diabetes mellitus   . CHF (congestive heart failure)   . Hypertension   . Pacemaker     Medications:  Scheduled:    . albuterol  5 mg Nebulization Once  . aspirin  324 mg Oral Once  . aspirin EC  81 mg Oral Daily  . furosemide  40 mg Intravenous Once  .  HYDROmorphone (DILAUDID) injection  1 mg Intravenous Once  . insulin aspart  0-20 Units Subcutaneous TID WC  . insulin aspart  0-5 Units Subcutaneous QHS  . insulin glargine  20 Units Subcutaneous QHS  . ipratropium  0.5 mg Nebulization Once  . lisinopril  20 mg Oral Daily  . metoprolol succinate  25 mg Oral Daily  .  morphine injection  4 mg Intravenous Once  . nitroGLYCERIN      . predniSONE  60 mg Oral Once  . sodium chloride  3 mL Intravenous Q12H  . DISCONTD: heparin  5,000 Units Subcutaneous Q8H    Assessment: 61 yo with MMP who was admitted for CP as well as QTc prolongation. It's unclear what meds she is on at this point. IV heparin has been ordered to r/o MI.   Goal of Therapy:  Heparin level 0.3-0.7 units/ml Monitor platelets by anticoagulation protocol: Yes   Plan:  1. Heparin bolus 4000 units x1 2. Heparin drip at 1000 units/hr 3. F/u  with heparin leve/cath  Onnie Boer [place] 04/02/2012,9:35 AM

## 2012-04-03 DIAGNOSIS — I5023 Acute on chronic systolic (congestive) heart failure: Secondary | ICD-10-CM

## 2012-04-03 DIAGNOSIS — I428 Other cardiomyopathies: Secondary | ICD-10-CM

## 2012-04-03 DIAGNOSIS — E1129 Type 2 diabetes mellitus with other diabetic kidney complication: Secondary | ICD-10-CM

## 2012-04-03 DIAGNOSIS — I509 Heart failure, unspecified: Secondary | ICD-10-CM

## 2012-04-03 DIAGNOSIS — E1165 Type 2 diabetes mellitus with hyperglycemia: Secondary | ICD-10-CM

## 2012-04-03 DIAGNOSIS — E119 Type 2 diabetes mellitus without complications: Secondary | ICD-10-CM

## 2012-04-03 DIAGNOSIS — R109 Unspecified abdominal pain: Secondary | ICD-10-CM

## 2012-04-03 DIAGNOSIS — R079 Chest pain, unspecified: Secondary | ICD-10-CM

## 2012-04-03 DIAGNOSIS — I359 Nonrheumatic aortic valve disorder, unspecified: Secondary | ICD-10-CM

## 2012-04-03 HISTORY — DX: Type 2 diabetes mellitus without complications: E11.9

## 2012-04-03 LAB — BASIC METABOLIC PANEL
BUN: 15 mg/dL (ref 6–23)
CO2: 28 mEq/L (ref 19–32)
Calcium: 9.1 mg/dL (ref 8.4–10.5)
Chloride: 104 mEq/L (ref 96–112)
Creatinine, Ser: 0.8 mg/dL (ref 0.50–1.10)
GFR calc Af Amer: >90 mL/min (ref >90)
GFR calc non Af Amer: 80 mL/min — ABNORMAL LOW (ref >90)
Glucose, Bld: 151 mg/dL — ABNORMAL HIGH (ref 70–99)
Potassium: 3.2 mEq/L — ABNORMAL LOW (ref 3.5–5.1)
Sodium: 144 mEq/L (ref 135–145)

## 2012-04-03 LAB — CBC
HCT: 39.7 % (ref 36.0–46.0)
Hemoglobin: 13 g/dL (ref 12.0–15.0)
MCH: 27 pg (ref 26.0–34.0)
MCHC: 32.7 g/dL (ref 30.0–36.0)
MCV: 82.5 fL (ref 78.0–100.0)
Platelets: 212 10*3/uL (ref 150–400)
RBC: 4.81 MIL/uL (ref 3.87–5.11)
RDW: 16.4 % — ABNORMAL HIGH (ref 11.5–15.5)
WBC: 6.8 10*3/uL (ref 4.0–10.5)

## 2012-04-03 LAB — GLUCOSE, CAPILLARY
Glucose-Capillary: 139 mg/dL — ABNORMAL HIGH (ref 70–99)
Glucose-Capillary: 255 mg/dL — ABNORMAL HIGH (ref 70–99)

## 2012-04-03 MED ORDER — LISINOPRIL 40 MG PO TABS
40.0000 mg | ORAL_TABLET | Freq: Every day | ORAL | Status: DC
  Administered 2012-04-03: 40 mg via ORAL
  Filled 2012-04-03: qty 1

## 2012-04-03 MED ORDER — METOPROLOL SUCCINATE ER 100 MG PO TB24
100.0000 mg | ORAL_TABLET | Freq: Every day | ORAL | Status: DC
  Administered 2012-04-03: 100 mg via ORAL
  Filled 2012-04-03: qty 1

## 2012-04-03 MED ORDER — NITROGLYCERIN 0.4 MG SL SUBL: 0.4000 mg | SUBLINGUAL_TABLET | SUBLINGUAL | Status: DC | PRN

## 2012-04-03 MED ORDER — SPIRONOLACTONE 25 MG PO TABS
25.0000 mg | ORAL_TABLET | Freq: Every day | ORAL | Status: DC
  Administered 2012-04-03: 25 mg via ORAL
  Filled 2012-04-03: qty 1

## 2012-04-03 MED ORDER — POTASSIUM CHLORIDE CRYS ER 20 MEQ PO TBCR
40.0000 meq | EXTENDED_RELEASE_TABLET | Freq: Once | ORAL | Status: AC
  Administered 2012-04-03: 40 meq via ORAL
  Filled 2012-04-03: qty 2

## 2012-04-03 MED ORDER — POTASSIUM CHLORIDE CRYS ER 20 MEQ PO TBCR: 20.0000 meq | EXTENDED_RELEASE_TABLET | Freq: Every day | ORAL | Status: DC

## 2012-04-03 MED ORDER — FUROSEMIDE 40 MG PO TABS
40.0000 mg | ORAL_TABLET | Freq: Every day | ORAL | Status: DC
  Administered 2012-04-03: 40 mg via ORAL
  Filled 2012-04-03: qty 1

## 2012-04-03 NOTE — Progress Notes (Signed)
  Echocardiogram 2D Echocardiogram has been performed.  [name] 04/03/2012, 9:00 AM

## 2012-04-03 NOTE — Progress Notes (Signed)
Patient's IV and telemetry discontinued, patient verbalizes understanding of discharge instructions and medications________________________________________________________________________________________D. Owens Shark RN

## 2012-04-03 NOTE — Discharge Summary (Signed)
Discharge Summary   Patient ID: [name],  MRN: [id number], DOB/AGE: 1951/10/02 61 y.o.  Admit date: 04/01/2012 Discharge date: 04/03/2012  Discharge Diagnoses Principal Problem:  *Chest pain Active Problems:  Nonischemic cardiomyopathy  Hypertension  Type 2 diabetes mellitus  Acute on chronic systolic CHF (congestive heart failure)  Abdominal pain   Allergies No Known Allergies  Diagnostic Studies/Procedures  PORTABLE CHEST X-RAY- 04/01/12  Comparison: None  Findings: Left AICD remains in place, unchanged. Cardiomegaly. No  confluent airspace opacities, effusions or edema. No acute bony  abnormality.  IMPRESSION:  Cardiomegaly. No acute findings.  CARDIAC CATHETERIZATION- 04/02/12  Procedure: Left Heart Cath, Selective Coronary Angiography, LV angiography  Indication: chest pain, abnormal ECG  Procedural details: The right groin was prepped, draped, and anesthetized with 1% lidocaine. Using modified Seldinger technique, a 4 French sheath was introduced into the right femoral artery. Standard Judkins catheters were used for coronary angiography and left ventriculography. Catheter exchanges were performed over a guidewire. There were no immediate procedural complications. The patient was transferred to the post catheterization recovery area for further monitoring. BP was elevated. She was given IV labetelol and IV metoprolol. There are discrepancies in her medications from home (Toprol dose). She may need adjustment of this.  Procedural Findings:  Hemodynamics:  AO 187/105 (138)  LV 167/27  NO gradient.  Coronary angiography:  Coronary dominance: right  Left mainstem: Mild calcification. Large caliber. No obstruction  Left anterior descending (LAD): Provides a large diagonal and is smooth and free of disease.  Left circumflex (LCx): Provides an intermediate and three marginals, one of which is large. No obstruction was noted.  Right coronary artery (RCA): Large in  caliber providing a large PDA, smaller PLA. No obstruction  Left ventriculography: Left ventricular systolic function is mildly reduced, with mild global hypokinesis. EF does not appear less than 45%  Final Conclusions:  1. No significant coronary obstruction  2. Mildly reduced overall EF with global hypokinesis  2D ECHOCARDIOGRAM- 04/03/12  Normal LV size with mild global hypokinesis, LVEF 45-50%, moderate diastolic dysfunction, normal RV size and systolic function. Mild pulmonary hypertension.   History of Present Illness  [name] is a 61yo female with the above problem list who was admitted to [facility] on 04/01/12 with acute on chronic systolic CHF. Of note, the patient has an single-lead ICD as well.   She was in her USOH the night prior to admission. She took a laxative for constipation. The following morning, she reported abdominal cramping. She went to church and ate lunch. She then experienced worsening lower abdominal pain radiating to her left flank. She became hypertensive SBP 190s and short of breath prompting her ED presentation.   Upon ED arrival, CXR revealed cardiomegaly, otherwise no acute findings. pBNP was mildly elevated. EKG revealed no evidence of ischemia. POC TnI was WNL. CMET revealed mildly elevated alk phos and ALT, and lipase. CBC was unremarkable. TSH WNL. The patient was subsequently admitted for originally suspected mild acute on chronic diastolic CHF. Believed to have been exacerbated by her hypertensive episode prior to admission.  Hospital Course   She was given a dose of Lasix IV with monitored output overnight. Outpatient home antihypertensives were continued. Cardiac biomarkers were cycled and found to be WNL. TSH WNL. Her abdominal pain improved, however, she did have chest pain with new TWIs on EKG overnight. She was subsequently started on heparin IV and planned for cardiac cath later that day.   The patient did diurese well  overnight as well.  She was informed, consented and prepped for cardiac catheterization which is described above. Notably, this revealed no significant coronary obstruction and mildly reduced overall EF with global hypokinesis. The patient tolerated the procedure well without complications.   Her symptoms improved. Lasix was transitioned to PO. D-dimer was WNL. 2D echo was ordered noted above. She was assessed by Dr. Stanford Breed this morning, and found to be stable for discharge. She will resume outpatient antihypertensives. She will resume the current dose of Lasix with potassium supplementation. He noted considering adding hydralazine/nitrates as an outpatient if she remains hypertensive on the current regimen. Additionally, the recommendation was made to assess GGT and 5' nucleotidase as an outpatient to further assess increased alk phos in an effort to investigate if the source of her abdominal pain is hepatobiliary. She will follow-up with Dr. Stanford Breed as noted below. This information, including activity restrictions and supplemental heart failure material, has been clearly outlined in the discharge AVS.   Discharge Vitals:  Blood pressure 162/92, pulse 78, temperature 98 F (36.7 C), temperature source Oral, resp. rate 18, height 5' (1.524 m), weight 108.818 kg (239 lb 14.4 oz), SpO2 100.00%.    Weight change: 0 kg (0 lb)  Labs: Recent Labs  Basename 04/03/12 0525 04/02/12 0542   WBC 6.8 9.5   HGB 13.0 13.7   HCT 39.7 43.0   MCV 82.5 83.2   PLT 212 244   No results found for this basename: VITAMINB12,FOLATE,FERRITIN,TIBC,IRON,RETICCTPCT in the last 72 hours Recent Labs  Basename 04/02/12 1515   DDIMER <0.22    Lab 04/03/12 0525 04/02/12 0542 04/01/12 2316  NA 144 137 138  K 3.2* 4.2 3.9  CL 104 97 100  CO2 28 28 25   BUN 15 15 11   CREATININE 0.80 0.81 0.78  CALCIUM 9.1 9.7 9.5  PROT -- 7.7 --  BILITOT -- 0.4 --  ALKPHOS -- 152* --  ALT -- 27 --  AST -- 25 --  AMYLASE -- -- --  LIPASE -- 23 --    GLUCOSE 151* 302* 214*   Recent Labs  Basename 04/02/12 0542   HGBA1C 9.2*   Recent Labs  Basename 04/02/12 1855 04/02/12 0915 04/02/12 0542   CKTOTAL 157 146 159   CKMB 4.2* 3.5 3.6   CKMBINDEX -- -- --   TROPONINI <0.30 <0.30 <0.30   No components found with this basename: POCBNP No results found for this basename: CHOL,HDL,LDLCALC,TRIG,CHOLHDL,LDLDIRECT in the last 72 hours  Basename 04/02/12 0542  TSH 0.644  T4TOTAL --  T3FREE --  THYROIDAB --    Disposition:  Discharge Orders    Future Appointments: Provider: Department: Dept Phone: Center:   05/08/2012 10:45 AM Lelon Perla, MD [place] [phone] [id number]     Follow-up Information    Follow up with Kirk Ruths, MD on 05/08/2012. (At 10:45 AM for follow-up after this hospitalization. )    Contact information:   [address], [place] [phone]          Discharge Medications:  Medication List  As of 04/03/2012  2:14 PM   START taking these medications         nitroGLYCERIN 0.4 MG SL tablet   Commonly known as: NITROSTAT   Place 1 tablet (0.4 mg total) under the tongue every 5 (five) minutes as needed for chest pain.      potassium chloride SA 20 MEQ tablet   Commonly  known as: K-DUR,KLOR-CON   Take 1 tablet (20 mEq total) by mouth daily.         CONTINUE taking these medications         albuterol 108 (90 BASE) MCG/ACT inhaler   Commonly known as: PROVENTIL HFA;VENTOLIN HFA      aspirin EC 81 MG tablet      atorvastatin 40 MG tablet   Commonly known as: LIPITOR      citalopram 20 MG tablet   Commonly known as: CELEXA      Fluticasone-Salmeterol 250-50 MCG/DOSE Aepb   Commonly known as: ADVAIR      furosemide 40 MG tablet   Commonly known as: LASIX      gabapentin 300 MG capsule   Commonly known as: NEURONTIN      insulin glargine 100 UNIT/ML injection   Commonly known as: LANTUS      insulin lispro 100  UNIT/ML injection   Commonly known as: HUMALOG      ketoconazole 2 % shampoo   Commonly known as: NIZORAL      lisinopril 40 MG tablet   Commonly known as: PRINIVIL,ZESTRIL      metoprolol 200 MG 24 hr tablet   Commonly known as: TOPROL-XL      naproxen 500 MG tablet   Commonly known as: NAPROSYN      spironolactone 25 MG tablet   Commonly known as: ALDACTONE      traMADol 50 MG tablet   Commonly known as: ULTRAM          Where to get your medications    These are the prescriptions that you need to pick up. We sent them to a specific pharmacy, so you will need to go there to get them.   [id number] - [place], [place]    [address] [place]  [address]    Phone: [phone]    Hours: 24-hours        nitroGLYCERIN 0.4 MG SL tablet   potassium chloride SA 20 MEQ tablet             Outstanding Labs/Studies: None  Duration of Discharge Encounter: Greater than 30 minutes including physician time.  Signed, R. Valeria Batman, PA-C 04/03/2012, 2:14 PM

## 2012-04-03 NOTE — Discharge Instructions (Signed)
PLEASE REMEMBER TO BRING ALL OF YOUR MEDICATIONS TO EACH OF YOUR FOLLOW-UP OFFICE VISITS.  PLEASE TAKE ALL NEW MEDICATIONS/MEDICATION CHANGES AS PRESCRIBED.  PLEASE ATTEND ALL FOLLOW-UP APPOINTMENTS.   Activity: Increase activity slowly as tolerated. You may shower, but no soaking baths for 6 days. No driving for 1 day. No lifting over 5 lbs for 6 days. No sexual activity for 6 days.   You May Return to Work: in 6 days (if applicable)  Wound Care: You may wash cath site gently with soap and water. Keep cath site clean and dry. If you notice pain, swelling, bleeding or pus at your cath site, please call [phone].     Heart Failure Heart failure (HF) is a condition in which the heart has trouble pumping blood. This means your heart does not pump blood efficiently for your body to work well. In some cases of HF, fluid may back up into your lungs or you may have swelling (edema) in your lower legs. HF is a long-term (chronic) condition. It is important for you to take good care of yourself and follow your caregiver's treatment plan. CAUSES   Health conditions:   High blood pressure (hypertension) causes the heart muscle to work harder than normal. When pressure in the blood vessels is high, the heart needs to pump (contract) with more force in order to circulate blood throughout the body. High blood pressure eventually causes the heart to become stiff and weak.   Coronary artery disease (CAD) is the buildup of cholesterol and fat (plaques) in the arteries of the heart. The blockage in the arteries deprives the heart muscle of oxygen and blood. This can cause chest pain and may lead to a heart attack. High blood pressure can also contribute to CAD.   Heart attack (myocardial infarction) occurs when 1 or more arteries in the heart become blocked. The loss of oxygen damages the muscle tissue of the heart. When this happens, part of the heart muscle dies. The injured tissue does not contract as well  and weakens the heart's ability to pump blood.   Abnormal heart valves can cause HF when the heart valves do not open and close properly. This makes the heart muscle pump harder to keep the blood flowing.   Heart muscle disease (cardiomyopathy or myocarditis) is damage to the heart muscle from a variety of causes. These can include drug or alcohol abuse, infections, or unknown reasons. These can increase the risk of HF.   Lung disease makes the heart work harder because the lungs do not work properly. This can cause a strain on the heart leading it to fail.   Diabetes increases the risk of HF. High blood sugar contributes to high fat (lipid) levels in the blood. Diabetes can also cause slow damage to tiny blood vessels that carry important nutrients to the heart muscle. When the heart does not get enough oxygen and food, it can cause the heart to become weak and stiff. This leads to a heart that does not contract efficiently.   Other diseases can contribute to HF. These include abnormal heart rhythms, thyroid problems, and low blood counts (anemia).   Unhealthy lifestyle habits:   Obesity.   Smoking.   Eating foods high in fat and cholesterol.   Eating or drinking beverages high in salt.   Drug or alcohol abuse.   Lack of exercise.  SYMPTOMS  HF symptoms may vary and can be hard to detect. Symptoms may include:  Shortness of breath  with activity, such as climbing stairs.   Persistent cough.   Swelling of the feet, ankles, legs, or abdomen.   Unexplained weight gain.   Difficulty breathing when lying flat.   Waking from sleep because of the need to sit up and get more air.   Rapid heartbeat.   Fatigue and loss of energy.   Feeling lightheaded or close to fainting.  DIAGNOSIS  A diagnosis of HF is based on your history, symptoms, physical examination, and diagnostic tests. Diagnostic tests for HF may include:  EKG.   Chest X-ray.   Blood tests.   Exercise stress  test.   Blood oxygen test (arterial blood gas).   Evaluation by a heart doctor (cardiologist).   Ultrasound evaluation of the heart (echocardiogram).   Heart artery test to look for blockages (angiogram).   Radioactive imaging to look at the heart (radionuclide test).  TREATMENT  Treatment is aimed at managing the symptoms of HF. Medicines, lifestyle changes, or surgical intervention may be necessary to treat HF.  Medicines to help treat HF may include:   Angiotensin-converting enzyme (ACE) inhibitors. These block the effects of a blood protein called angiotensin-converting enzyme. ACE inhibitors relax (dilate) the blood vessels and help lower blood pressure. This decreases the workload of the heart, slows the progression of HF, and improves symptoms.   Angiotensin receptor blockers (ARBs). These medications work similar to ACE inhibitors. ARBs may be an alternative for people who cannot tolerate an ACE inhibitor.   Aldosterone antagonists. This medication helps get rid of extra fluid from your body. This lowers the volume of blood the heart has to pump.   Water pills (diuretics). Diuretics cause the kidneys to remove salt and water from the blood. The extra fluid is removed by urination. By removing extra fluid from the body, diuretics help lower the workload of the heart and help prevent fluid buildup in the lungs so breathing is easier.   Beta blockers. These prevent the heart from beating too fast and improve heart muscle strength. Beta blockers help maintain a normal heart rate, control blood pressure, and improve HF symptoms.   Digitalis. This increases the force of the heartbeat and may be helpful to people with HF or heart rhythm problems.   Healthy lifestyle changes include:   Stopping smoking.   Eating a healthy diet. Avoid foods high in fat. Avoid foods fried in oil or made with fat. A dietician can help with healthy food choices.   Limiting how much salt you eat.    Limiting alcohol intake to no more than 1 drink per day for women and 2 drinks per day for men. Drinking more than that is harmful to your heart. If your heart has already been damaged by alcohol or you have severe HF, drinking alcohol should be stopped completely.   Exercising as directed by your caregiver.   Surgical treatment for HF may include:   Procedures to open blocked arteries, repair damaged heart valves, or remove damaged heart muscle tissue.   A pacemaker to help heart muscle function and to control certain abnormal heart rhythms.   A defibrillator to possibly prevent sudden cardiac death.  HOME CARE INSTRUCTIONS   Activity level. Your caregiver can help you determine what type of exercise program may be helpful. It is important to maintain your strength. Pace your physical activity to avoid shortness of breath or chest pain. Rest for 1 hour before and after meals. A cardiac rehabilitation program may be helpful to some  people with HF.   Diet. Eat a heart healthy diet. Food choices should be low in saturated fat and cholesterol. Talk to a dietician to learn about heart healthy foods.   Salt intake. When you have HF, you need to limit the amount of salt you eat. Eat less than 1500 milligrams (mg) of salt per day or as recommended by your caregiver.   Weight monitoring. Weigh yourself every day. You should weigh yourself in the morning after you urinate and before you eat breakfast. Wear the same amount of clothing each time you weigh yourself. Record your weight daily. Bring your recorded weights to your clinic visits. Tell your caregiver right away if you have gained 3 lb/1.4 kg in 1 day, or 5 lb/2.3 kg in a week or whatever amount you were told to report.   Blood pressure monitoring. This should be done as directed by your caregiver. A home blood pressure cuff can be purchased at a drugstore. Record your blood pressure numbers and bring them to your clinic visits. Tell your  caregiver if you become dizzy or lightheaded upon standing up.   Smoking. If you are currently a smoker, it is time to quit. Nicotine makes your heart work harder by causing your blood vessels to constrict. Do not use nicotine gum or patches before talking to your caregiver.   Follow up. Be sure to schedule a follow-up visit with your caregiver. Keep all your appointments.  SEEK MEDICAL CARE IF:   Your weight increases by 3 lb/1.4 kg in 1 day or 5 lb/2.3 kg in a week.   You notice increasing shortness of breath that is unusual for you. This may happen during rest, sleep, or with activity.   You cough more than normal, especially with physical activity.   You notice more swelling in your hands, feet, ankles, or belly (abdomen).   You are unable to sleep because it is hard to breathe.   You cough up bloody mucus (sputum).   You begin to feel "jumping" or "fluttering" sensations (palpitations) in your chest.  SEEK IMMEDIATE MEDICAL CARE IF:   You have severe chest pain or pressure which may include symptoms such as:   Pain or pressure in the arms, neck, jaw, or back.   Feeling sweaty.   Feeling sick to your stomach (nauseous).   Feeling short of breath while at rest.   Having a fast or irregular heartbeat.   You experience stroke symptoms. These symptoms include:   Facial weakness or numbness.   Weakness or numbness in an arm, leg, or on one side of your body.   Blurred vision.   Difficulty talking or thinking.   Dizziness or fainting.   Severe headache.  THESE ARE MEDICAL EMERGENCIES. Do not wait to see if the symptoms go away. Call your local emergency services (911 in [place] DO NOT drive yourself to the hospital. IMPORTANT  Make a list of every medicine, vitamin, or herbal supplement you are taking. Keep the list with you at all times. Show it to your caregiver at every visit. Keep the list up-to-date.   Ask your caregiver or pharmacist to write an explanation of  each medicine you are taking. This should include:   Why you are taking it.   The possible side effects.   The best time of day to take it.   Foods to take with it or what foods to avoid.   When to stop taking it.  MAKE SURE YOU:  Understand these instructions.   Will watch your condition.   Will get help right away if you are not doing well or get worse.  Document Released: 09/19/2005 Document Revised: 09/08/2011 Document Reviewed: 01/01/2010 [facility] Patient Information 2012 [place].

## 2012-04-03 NOTE — Progress Notes (Signed)
@   Subjective:  Denies CP or dyspnea; no abdominal pain  Objective:  Filed Vitals:   04/02/12 2100 04/02/12 2254 04/03/12 0047 04/03/12 0415  BP: 169/96 169/96 144/61 165/60  Pulse: 89 81 84 79  Temp:  98.7 F (37.1 C) 98.6 F (37 C) 98.3 F (36.8 C)  TempSrc:  Oral Oral Oral  Resp:  18 18 20   Height:      Weight:    108.818 kg (239 lb 14.4 oz)  SpO2:  97% 100% 100%    Intake/Output from previous day:  Intake/Output Summary (Last 24 hours) at 04/03/12 0912 Last data filed at 04/03/12 0418  Gross per 24 hour  Intake    240 ml  Output   1000 ml  Net   -760 ml    Physical Exam: Physical exam: Well-developed well-nourished in no acute distress.  Skin is warm and dry.  HEENT is normal.  Neck is supple. Chest is clear to auscultation with normal expansion.  Cardiovascular exam is regular rate and rhythm.  Abdominal exam nontender or distended. No masses palpated. Right groin with no hematoma and no bruit Extremities show no edema. neuro grossly intact    Lab Results: Basic Metabolic Panel:  Basename 04/03/12 0525 04/02/12 0915 04/02/12 0542  NA 144 -- 137  K 3.2* -- 4.2  CL 104 -- 97  CO2 28 -- 28  GLUCOSE 151* -- 302*  BUN 15 -- 15  CREATININE 0.80 -- 0.81  CALCIUM 9.1 -- 9.7  MG -- 1.8 --  PHOS -- -- --   CBC:  Basename 04/03/12 0525 04/02/12 0542 04/01/12 2316  WBC 6.8 9.5 --  NEUTROABS -- -- 6.7  HGB 13.0 13.7 --  HCT 39.7 43.0 --  MCV 82.5 83.2 --  PLT 212 244 --   Cardiac Enzymes:  Basename 04/02/12 1855 04/02/12 0915 04/02/12 0542  CKTOTAL 157 146 159  CKMB 4.2* 3.5 3.6  CKMBINDEX -- -- --  TROPONINI <0.30 <0.30 <0.30     Assessment/Plan:  1) Chest pain - enzymes neg; cath without obstructive CAD; DDimer normal. No plans for further eval 2) NICM - Plan continue preadmission meds. Await FU echo performed this AM 3) Acute on chronic systolic CHF - patient euvolemic; continue present dose of lasix; supplement K 4) HTN - BP elevated;  resume preadmission meds; follow and add hydralazine/nitrates as outpatient if needed. 5) Abdominal pain - resolved; alk phos mildly elevated; check GGT and 5' nucleotidase as outpatient. DC later today and fu with me 4-6 weeks > 30 min PA and physician time D2  Braylea Alterman 04/03/2012, 9:12 AM

## 2012-04-03 NOTE — Discharge Summary (Signed)
See progress notes Brian Crenshaw  

## 2012-04-04 ENCOUNTER — Encounter (HOSPITAL_COMMUNITY): Payer: Self-pay | Admitting: *Deleted

## 2012-04-14 ENCOUNTER — Emergency Department (HOSPITAL_COMMUNITY)
Admission: EM | Admit: 2012-04-14 | Discharge: 2012-04-14 | Disposition: A | Discharge status: Home / Self Care | Payer: Medicare Other | Attending: Emergency Medicine | Admitting: Emergency Medicine

## 2012-04-14 DIAGNOSIS — G4730 Sleep apnea, unspecified: Secondary | ICD-10-CM | POA: Insufficient documentation

## 2012-04-14 DIAGNOSIS — T148XXA Other injury of unspecified body region, initial encounter: Secondary | ICD-10-CM | POA: Insufficient documentation

## 2012-04-14 DIAGNOSIS — W010XXA Fall on same level from slipping, tripping and stumbling without subsequent striking against object, initial encounter: Secondary | ICD-10-CM | POA: Insufficient documentation

## 2012-04-14 DIAGNOSIS — Y93E9 Activity, other interior property and clothing maintenance: Secondary | ICD-10-CM | POA: Insufficient documentation

## 2012-04-14 DIAGNOSIS — Z95810 Presence of automatic (implantable) cardiac defibrillator: Secondary | ICD-10-CM | POA: Insufficient documentation

## 2012-04-14 DIAGNOSIS — Z794 Long term (current) use of insulin: Secondary | ICD-10-CM | POA: Insufficient documentation

## 2012-04-14 DIAGNOSIS — I10 Essential (primary) hypertension: Secondary | ICD-10-CM | POA: Insufficient documentation

## 2012-04-14 DIAGNOSIS — W19XXXA Unspecified fall, initial encounter: Secondary | ICD-10-CM

## 2012-04-14 DIAGNOSIS — I5022 Chronic systolic (congestive) heart failure: Secondary | ICD-10-CM | POA: Insufficient documentation

## 2012-04-14 DIAGNOSIS — I509 Heart failure, unspecified: Secondary | ICD-10-CM | POA: Insufficient documentation

## 2012-04-14 DIAGNOSIS — M79609 Pain in unspecified limb: Secondary | ICD-10-CM

## 2012-04-14 DIAGNOSIS — Y92009 Unspecified place in unspecified non-institutional (private) residence as the place of occurrence of the external cause: Secondary | ICD-10-CM | POA: Insufficient documentation

## 2012-04-14 DIAGNOSIS — Z79899 Other long term (current) drug therapy: Secondary | ICD-10-CM | POA: Insufficient documentation

## 2012-04-14 DIAGNOSIS — E119 Type 2 diabetes mellitus without complications: Secondary | ICD-10-CM | POA: Insufficient documentation

## 2012-04-14 MED ORDER — CYCLOBENZAPRINE HCL 10 MG PO TABS: 10.0000 mg | ORAL_TABLET | Freq: Two times a day (BID) | ORAL | Status: AC | PRN

## 2012-04-14 MED ORDER — OXYCODONE-ACETAMINOPHEN 5-325 MG PO TABS
1.0000 | ORAL_TABLET | Freq: Four times a day (QID) | ORAL | Status: AC | PRN
Start: 2012-04-14 — End: 2012-04-24

## 2012-04-14 NOTE — ED Provider Notes (Signed)
Medical screening examination/treatment/procedure(s) were performed by non-physician practitioner and as supervising physician I was immediately available for consultation/collaboration.   Saddie Benders. Carliss Quast, MD 04/14/12 1330

## 2012-04-14 NOTE — Progress Notes (Signed)
*  Preliminary Results* Left lower extremity venous duplex completed. Left lower extremity is negative for deep vein thrombosis.  04/14/2012 12:32 PM Maudry Mayhew, RDMS, RDCS

## 2012-04-14 NOTE — ED Provider Notes (Signed)
History     CSN: [id number]  Arrival date & time 04/14/12  1031   First MD Initiated Contact with Patient 04/14/12 1040      Chief Complaint  Patient presents with  . Fall    (Consider location/radiation/quality/duration/timing/severity/associated sxs/prior treatment) HPI Comments: Patient here with increasing L leg pain s/p fall on Monday. She was cleaning out her refrigerator and slipped on the trash can cover which was on the floor. Fell into a "split" position with left leg in front. Able to ambulate but with pain going through her leg. Did not hit head upon fall. No lightheadedness, dizziness, confusion before or after fall. No prior injury to left hip/leg. Hospitalized 2 weeks ago for 3 days due to CHF.  Patient is a 61 y.o. female presenting with fall. The history is provided by the patient.  Fall The accident occurred more than 2 days ago.    Past Medical History  Diagnosis Date  . Hyperthyroidism, subclinical   . Post menopausal syndrome   . Sleep apnea   . Obesity   . Cardiac defibrillator in situ     Atlas II VR (SJM) implanted by Dr Lovena Le  . Eczema   . Depression   . Lipoma   . Chronic ulcer of leg   . Hyperlipidemia   . Chronic systolic congestive heart failure   . Cardiomyopathy     nonischemic  . Diabetes mellitus   . HTN (hypertension)   . COPD (chronic obstructive pulmonary disease)   . Diabetes mellitus   . CHF (congestive heart failure)   . Hypertension   . Pacemaker     Past Surgical History  Procedure Date  . Tubal ligation   . Cardiac defibrillator placement 05/04/2007    SJM Atlas II VR ICD  . Hysteroscopy   . Cardiac defibrillator placement   . Abdominal hysterectomy   . Hernia repair   . Insert / replace / remove pacemaker     Family History  Problem Relation Age of Onset  . Stroke Mother   . Seizures Father   . Diabetes Sister   . Asthma Maternal Aunt     aunts  . Asthma Maternal Uncle     uncles  . Heart disease Father     . Heart disease Paternal Aunt     aunts  . Heart disease Paternal Uncle     uncles  . Heart disease Maternal Aunt     aunts  . Heart disease Maternal Uncle     uncles  . Heart disease Maternal Grandfather     History  Substance Use Topics  . Smoking status: Current Everyday Smoker -- 0.2 packs/day for 35 years    Types: Cigarettes  . Smokeless tobacco: Not on file   Comment: currently smoking 8 cigs daily.   . Alcohol Use: 1.0 oz/week     Quit 05/2007    OB History    Grav Para Term Preterm Abortions TAB SAB Ect Mult Living                  Review of Systems  Respiratory: Negative for shortness of breath.   Cardiovascular: Negative for chest pain.  Musculoskeletal: Positive for myalgias and arthralgias. Negative for back pain.  Skin: Negative for color change.  Neurological: Negative for dizziness, syncope and light-headedness.  Psychiatric/Behavioral: Negative for confusion.    Allergies  Avandia; Hydrocortisone; and Pioglitazone  Home Medications   Current Outpatient Rx  Name Route Sig Dispense Refill  .  ASPIRIN 81 MG PO TABS Oral Take 81 mg by mouth daily.      . ATORVASTATIN CALCIUM 40 MG PO TABS Oral Take 40 mg by mouth at bedtime.    [name] CITALOPRAM HYDROBROMIDE 20 MG PO TABS Oral Take 20 mg by mouth daily.    [name] FLUTICASONE-SALMETEROL 250-50 MCG/DOSE IN AEPB Inhalation Inhale 1 puff into the lungs every 12 (twelve) hours.    . FUROSEMIDE 40 MG PO TABS Oral Take 40 mg by mouth daily.    [name] GABAPENTIN 300 MG PO CAPS Oral Take 300 mg by mouth daily.    . INSULIN ASPART 100 UNIT/ML [place] SOLN Subcutaneous Inject 3-20 Units into the skin 3 (three) times daily before meals. Sliding scale    . INSULIN GLARGINE 100 UNIT/ML  SOLN Subcutaneous Inject 44 Units into the skin 2 (two) times daily.    [name] KETOCONAZOLE 2 % EX SHAM Topical Apply topically. Apply to scalp three times per week - let lather and sit for 5 minutes and then rinse.     [name] KETOCONAZOLE 2 % EX SHAM Topical  Apply 1 application topically 2 (two) times a week.    [name] LISINOPRIL 40 MG PO TABS Oral Take 40 mg by mouth daily.    [name] METOPROLOL SUCCINATE ER 200 MG PO TB24 Oral Take 200 mg by mouth daily.    [name] NAPROXEN 500 MG PO TABS Oral Take 500 mg by mouth 2 (two) times daily with a meal.    . OXYCODONE-ACETAMINOPHEN 5-325 MG PO TABS Oral Take 1 tablet by mouth every 8 (eight) hours as needed. pain    . POTASSIUM CHLORIDE CRYS ER 20 MEQ PO TBCR Oral Take 1 tablet (20 mEq total) by mouth daily. 30 tablet 3  . SPIRONOLACTONE 25 MG PO TABS Oral Take 25 mg by mouth daily.      . TRIAMCINOLONE ACETONIDE 0.1 % EX OINT Topical Apply 1 application topically as needed. rash    . ALBUTEROL SULFATE HFA 108 (90 BASE) MCG/ACT IN AERS Inhalation Inhale 2 puffs into the lungs every 6 (six) hours as needed. Shortness of breath    . NITROGLYCERIN 0.4 MG SL SUBL Sublingual Place 1 tablet (0.4 mg total) under the tongue every 5 (five) minutes as needed for chest pain. 25 tablet 3    BP 147/61  Pulse 82  Temp 97.7 F (36.5 C) (Oral)  Resp 18  SpO2 97%  Physical Exam  Nursing note and vitals reviewed. Constitutional: She is oriented to person, place, and time. Vital signs are normal.       Obese, moderate distress  HENT:  Head: Normocephalic and atraumatic.  Eyes: Conjunctivae are normal.  Cardiovascular: Normal rate, regular rhythm, normal heart sounds and intact distal pulses.   Pulmonary/Chest: Effort normal and breath sounds normal.  Abdominal: Soft. There is no tenderness.  Musculoskeletal:       Left hip: She exhibits decreased range of motion (due to pain) and tenderness. She exhibits no deformity.       Very TTP of entire left leg, thigh, hip. Decreased active and passive ROM due to pain. No edema. Able to ambulate on her own.  Neurological: She is alert and oriented to person, place, and time. No sensory deficit.  Skin: No bruising and no ecchymosis noted. No erythema.       No ecchymosis    ED Course    Procedures (including critical care time)  Labs Reviewed - No data to display No results found.  No diagnosis found.    MDM  Patient has been ambulating for the past few days. Pain worsening over time, not acutely. Low risk of fx. No focal TTP of calf. Complete TTP of left leg consistent with muscle strain. LE duplex US to r/o any DVT. 12:51 PM Records reviewed- negative for DVT. Consistent for muscle strain. Able to ambulate on her own. Patient comfortable with discharge home with muscle relaxer and pain management.       Illene Labrador, PA-C 04/14/12 1258

## 2012-04-14 NOTE — ED Notes (Signed)
Pt is dressed and waiting for RN to discharge her home; family at bedside

## 2012-04-14 NOTE — ED Notes (Signed)
Fell last Monday while cleaning the refrigerator on trash can lid. Lt. Thigh hurting the most.using a walker.

## 2012-04-14 NOTE — ED Notes (Signed)
Family at bedside. 

## 2012-04-14 NOTE — ED Notes (Signed)
Pt getting fully undressed and into a gown at this time for her vascular procedure

## 2012-04-24 ENCOUNTER — Telehealth: Payer: Self-pay | Admitting: Cardiology

## 2012-04-24 NOTE — Telephone Encounter (Signed)
New problem;   Per Benjie Karvonen - [facility], F/U on sign order for medical necessity by Dr. Stanford Breed. Fax over on 6/10 & 7/11.

## 2012-04-26 ENCOUNTER — Encounter: Payer: Self-pay | Admitting: Internal Medicine

## 2012-04-26 NOTE — Telephone Encounter (Signed)
spoke with [name], aware do not have any paperwork on this pt. She will refax.

## 2012-05-02 ENCOUNTER — Telehealth: Payer: Self-pay | Admitting: Cardiology

## 2012-05-02 NOTE — Telephone Encounter (Signed)
New msg Pt wants to know can she have device ck on [phone] because of transportation issues

## 2012-05-02 NOTE — Telephone Encounter (Signed)
No answer or answering machine.

## 2012-05-08 ENCOUNTER — Ambulatory Visit (INDEPENDENT_AMBULATORY_CARE_PROVIDER_SITE_OTHER): Payer: Medicare Other | Admitting: Cardiology

## 2012-05-08 ENCOUNTER — Encounter: Payer: Self-pay | Admitting: Internal Medicine

## 2012-05-08 ENCOUNTER — Ambulatory Visit (INDEPENDENT_AMBULATORY_CARE_PROVIDER_SITE_OTHER): Payer: Medicare Other | Admitting: *Deleted

## 2012-05-08 ENCOUNTER — Encounter: Payer: Self-pay | Admitting: Cardiology

## 2012-05-08 VITALS — BP 131/79 | HR 99 | Ht 60.0 in | Wt 237.1 lb

## 2012-05-08 DIAGNOSIS — F17200 Nicotine dependence, unspecified, uncomplicated: Secondary | ICD-10-CM

## 2012-05-08 DIAGNOSIS — I5022 Chronic systolic (congestive) heart failure: Secondary | ICD-10-CM

## 2012-05-08 DIAGNOSIS — R748 Abnormal levels of other serum enzymes: Secondary | ICD-10-CM

## 2012-05-08 DIAGNOSIS — I428 Other cardiomyopathies: Secondary | ICD-10-CM

## 2012-05-08 DIAGNOSIS — I10 Essential (primary) hypertension: Secondary | ICD-10-CM

## 2012-05-08 DIAGNOSIS — E785 Hyperlipidemia, unspecified: Secondary | ICD-10-CM

## 2012-05-08 DIAGNOSIS — Z95810 Presence of automatic (implantable) cardiac defibrillator: Secondary | ICD-10-CM

## 2012-05-08 LAB — ICD DEVICE OBSERVATION
BATTERY VOLTAGE: 2.53 V
BRDY-0002RV: 40 {beats}/min
CHARGE TIME: 0 s
DEVICE MODEL ICD: 436280
RV LEAD AMPLITUDE: 12.1 mv
RV LEAD IMPEDENCE ICD: 590 Ohm
RV LEAD THRESHOLD: 0.5 V
TOT-0006: 20130328000000
TOT-0007: 3
TOT-0008: 0
TOT-0009: 4
TOT-0010: 34
TZAT-0001SLOWVT: 1
TZAT-0004SLOWVT: 8
TZAT-0012SLOWVT: 200 ms
TZAT-0013SLOWVT: 1
TZAT-0018SLOWVT: NEGATIVE
TZAT-0019SLOWVT: 7.5 V
TZAT-0020SLOWVT: 1 ms
TZON-0003SLOWVT: 300 ms
TZON-0004SLOWVT: 16
TZON-0005SLOWVT: 6
TZST-0001SLOWVT: 2
TZST-0001SLOWVT: 3
TZST-0001SLOWVT: 4
TZST-0001SLOWVT: 5
TZST-0003SLOWVT: 25 J
TZST-0003SLOWVT: 36 J
TZST-0003SLOWVT: 36 J
TZST-0003SLOWVT: 36 J
VENTRICULAR PACING ICD: 1 pct

## 2012-05-08 LAB — HEPATIC FUNCTION PANEL
ALT: 20 U/L (ref 0–35)
AST: 19 U/L (ref 0–37)
Albumin: 4.2 g/dL (ref 3.5–5.2)
Alkaline Phosphatase: 152 U/L — ABNORMAL HIGH (ref 39–117)
Bilirubin, Direct: 0.1 mg/dL (ref 0.0–0.3)
Total Bilirubin: 0.5 mg/dL (ref 0.3–1.2)
Total Protein: 7.3 g/dL (ref 6.0–8.3)

## 2012-05-08 LAB — BASIC METABOLIC PANEL
BUN: 25 mg/dL — ABNORMAL HIGH (ref 6–23)
CO2: 27 mEq/L (ref 19–32)
Calcium: 9.6 mg/dL (ref 8.4–10.5)
Chloride: 97 mEq/L (ref 96–112)
Creatinine, Ser: 1.1 mg/dL (ref 0.4–1.2)
GFR: 66.87 mL/min (ref >60.00)
Glucose, Bld: 375 mg/dL — ABNORMAL HIGH (ref 70–99)
Potassium: 5.7 mEq/L — ABNORMAL HIGH (ref 3.5–5.1)
Sodium: 135 mEq/L (ref 135–145)

## 2012-05-08 LAB — GAMMA GT: GGT: 30 U/L (ref 7–51)

## 2012-05-08 NOTE — Assessment & Plan Note (Signed)
Management per electrophysiology. 

## 2012-05-08 NOTE — Progress Notes (Signed)
ICD check

## 2012-05-08 NOTE — Progress Notes (Signed)
HPI: Pleasant female with history of nonischemic cardiomyopathy, hypertension, hyperlipidemia and diabetes mellitus for followup. Also with previous ICD. Patient admitted in June of 2013 with chest pain. Cardiac catheterization in July of 2013 showed mild calcification in the left main. No obstructive disease noted. There was mild global reduction in LV function. Echocardiogram in July of 2013 revealed an ejection fraction of 45-50%, moderate diastolic dysfunction and mild pulmonary hypertension. Patient was also treated for hypertension and mild CHF. Since she was [facility], she has dyspnea with more extreme activities. No orthopnea, PND, pedal edema, chest pain or syncope.   Current Outpatient Prescriptions  Medication Sig Dispense Refill  . albuterol (VENTOLIN HFA) 108 (90 BASE) MCG/ACT inhaler Inhale 2 puffs into the lungs every 6 (six) hours as needed. Shortness of breath      . aspirin 81 MG tablet Take 81 mg by mouth daily.        [name] atorvastatin (LIPITOR) 40 MG tablet Take 40 mg by mouth at bedtime.      . citalopram (CELEXA) 20 MG tablet Take 20 mg by mouth daily.      . Fluticasone-Salmeterol (ADVAIR) 250-50 MCG/DOSE AEPB Inhale 1 puff into the lungs every 12 (twelve) hours.      . furosemide (LASIX) 40 MG tablet Take 40 mg by mouth daily.      [name] gabapentin (NEURONTIN) 300 MG capsule Take 300 mg by mouth daily.      . insulin aspart (NOVOLOG) 100 UNIT/ML injection Inject 3-20 Units into the skin 3 (three) times daily before meals. Sliding scale      . insulin glargine (LANTUS) 100 UNIT/ML injection Inject 44 Units into the skin 2 (two) times daily.      [name] ketoconazole (NIZORAL) 2 % shampoo Apply 1 application topically 2 (two) times a week.      [name] lisinopril (PRINIVIL,ZESTRIL) 40 MG tablet Take 40 mg by mouth daily.      . metoprolol (TOPROL-XL) 200 MG 24 hr tablet Take 200 mg by mouth daily.      . naproxen (NAPROSYN) 500 MG tablet Take 500 mg by mouth 2 (two) times daily with a meal.        . nitroGLYCERIN (NITROSTAT) 0.4 MG SL tablet Place 1 tablet (0.4 mg total) under the tongue every 5 (five) minutes as needed for chest pain.  25 tablet  3  . potassium chloride SA (K-DUR,KLOR-CON) 20 MEQ tablet Take 1 tablet (20 mEq total) by mouth daily.  30 tablet  3  . spironolactone (ALDACTONE) 25 MG tablet Take 25 mg by mouth daily.        [name] triamcinolone (KENALOG) 0.1 % ointment Apply 1 application topically as needed. rash         Past Medical History  Diagnosis Date  . Hyperthyroidism, subclinical   . Post menopausal syndrome   . Sleep apnea   . Obesity   . Cardiac defibrillator in situ     Atlas II VR (SJM) implanted by Dr Lovena Le  . Eczema   . Depression   . Lipoma   . Chronic ulcer of leg   . Hyperlipidemia   . Chronic systolic congestive heart failure   . Cardiomyopathy     nonischemic  . Diabetes mellitus   . HTN (hypertension)   . COPD (chronic obstructive pulmonary disease)   . Diabetes mellitus   . CHF (congestive heart failure)   . Hypertension   . Pacemaker     Past Surgical History  Procedure Date  . Tubal ligation   . Cardiac defibrillator placement 05/04/2007    SJM Atlas II VR ICD  . Hysteroscopy   . Cardiac defibrillator placement   . Abdominal hysterectomy   . Hernia repair   . Insert / replace / remove pacemaker     History   Social History  . Marital Status: Married    Spouse Name: Alroy Dust    Number of Children: N/A  . Years of Education: N/A   Occupational History  . disabled    Social History Main Topics  . Smoking status: Current Everyday Smoker -- 0.2 packs/day for 35 years    Types: Cigarettes  . Smokeless tobacco: Not on file   Comment: currently smoking 3 cigs daily.   . Alcohol Use: 1.0 oz/week     Quit 05/2007  . Drug Use: No  . Sexually Active: Not on file   Other Topics Concern  . Not on file   Social History Narrative   ** Merged History Encounter ** Married    ROS: no fevers or chills, productive cough,  hemoptysis, dysphasia, odynophagia, melena, hematochezia, dysuria, hematuria, rash, seizure activity, orthopnea, PND, pedal edema, claudication. Remaining systems are negative.  Physical Exam: Well-developed obese in no acute distress.  Skin is warm and dry.  HEENT is normal.  Neck is supple.  Chest is clear to auscultation with normal expansion.  Cardiovascular exam is regular rate and rhythm.  Abdominal exam nontender or distended. No masses palpated. Extremities show no edema. neuro grossly intact

## 2012-05-08 NOTE — Assessment & Plan Note (Signed)
Continue statin. Recent alkaline phosphatase elevated. Repeat liver functions and check GGT and 5'-nucleotidase.

## 2012-05-08 NOTE — Assessment & Plan Note (Signed)
Patient counseled on discontinuing. 

## 2012-05-08 NOTE — Assessment & Plan Note (Signed)
Continue ACE inhibitor and beta blocker. 

## 2012-05-08 NOTE — Assessment & Plan Note (Signed)
Blood pressure controlled. Continue present medications. 

## 2012-05-08 NOTE — Assessment & Plan Note (Addendum)
Patient euvolemic on examination. Continue present dose of diuretic. Check potassium and renal function. Patient on home o2. Will continue.

## 2012-05-08 NOTE — Patient Instructions (Addendum)
Your physician wants you to follow-up in: 6 MONTHS WITH DR CRENSHAW You will receive a reminder letter in the mail two months in advance. If you don't receive a letter, please call our office to schedule the follow-up appointment.   Your physician recommends that you HAVE LAB WORK TODAY 

## 2012-05-09 ENCOUNTER — Other Ambulatory Visit: Payer: Self-pay | Admitting: *Deleted

## 2012-05-09 ENCOUNTER — Encounter: Payer: Self-pay | Admitting: Cardiology

## 2012-05-09 DIAGNOSIS — E875 Hyperkalemia: Secondary | ICD-10-CM

## 2012-05-11 ENCOUNTER — Other Ambulatory Visit (INDEPENDENT_AMBULATORY_CARE_PROVIDER_SITE_OTHER): Payer: Medicare Other

## 2012-05-11 DIAGNOSIS — E875 Hyperkalemia: Secondary | ICD-10-CM

## 2012-05-11 LAB — BASIC METABOLIC PANEL
BUN: 29 mg/dL — ABNORMAL HIGH (ref 6–23)
CO2: 25 mEq/L (ref 19–32)
Calcium: 9.1 mg/dL (ref 8.4–10.5)
Chloride: 100 mEq/L (ref 96–112)
Creatinine, Ser: 1.2 mg/dL (ref 0.4–1.2)
GFR: 60.37 mL/min (ref >60.00)
Glucose, Bld: 269 mg/dL — ABNORMAL HIGH (ref 70–99)
Potassium: 4.7 mEq/L (ref 3.5–5.1)
Sodium: 135 mEq/L (ref 135–145)

## 2012-05-11 LAB — NUCLEOTIDASE, 5', BLOOD: 5-Nucleotidase: 5 U/L (ref <11)

## 2012-07-17 ENCOUNTER — Encounter: Payer: Self-pay | Admitting: Internal Medicine

## 2012-07-18 ENCOUNTER — Encounter: Payer: Self-pay | Admitting: *Deleted

## 2012-07-19 ENCOUNTER — Encounter: Payer: Self-pay | Admitting: Internal Medicine

## 2012-07-23 ENCOUNTER — Encounter: Payer: Self-pay | Admitting: Internal Medicine

## 2012-07-26 ENCOUNTER — Encounter: Payer: Self-pay | Admitting: Internal Medicine

## 2012-07-26 ENCOUNTER — Ambulatory Visit (INDEPENDENT_AMBULATORY_CARE_PROVIDER_SITE_OTHER): Payer: Medicare Other | Admitting: Internal Medicine

## 2012-07-26 VITALS — BP 184/103 | HR 88 | Ht 60.0 in | Wt 243.2 lb

## 2012-07-26 DIAGNOSIS — Z95810 Presence of automatic (implantable) cardiac defibrillator: Secondary | ICD-10-CM

## 2012-07-26 DIAGNOSIS — I10 Essential (primary) hypertension: Secondary | ICD-10-CM

## 2012-07-26 DIAGNOSIS — I428 Other cardiomyopathies: Secondary | ICD-10-CM

## 2012-07-26 DIAGNOSIS — I5022 Chronic systolic (congestive) heart failure: Secondary | ICD-10-CM

## 2012-07-26 LAB — ICD DEVICE OBSERVATION
BATTERY VOLTAGE: 2.55 V
BRDY-0002RV: 40 {beats}/min
CHARGE TIME: 0 s
DEVICE MODEL ICD: 436280
HV IMPEDENCE: 40 Ohm
RV LEAD AMPLITUDE: 12.1 mv
RV LEAD IMPEDENCE ICD: 470 Ohm
RV LEAD THRESHOLD: 0.75 V
TOT-0006: 20130806000000
TOT-0007: 3
TOT-0008: 0
TOT-0009: 4
TOT-0010: 37
TZAT-0001SLOWVT: 1
TZAT-0004SLOWVT: 8
TZAT-0012SLOWVT: 200 ms
TZAT-0013SLOWVT: 1
TZAT-0018SLOWVT: NEGATIVE
TZAT-0019SLOWVT: 7.5 V
TZAT-0020SLOWVT: 1 ms
TZON-0003SLOWVT: 300 ms
TZON-0004SLOWVT: 16
TZON-0005SLOWVT: 6
TZST-0001SLOWVT: 2
TZST-0001SLOWVT: 3
TZST-0001SLOWVT: 4
TZST-0001SLOWVT: 5
TZST-0003SLOWVT: 25 J
TZST-0003SLOWVT: 36 J
TZST-0003SLOWVT: 36 J
TZST-0003SLOWVT: 36 J
VENTRICULAR PACING ICD: 0 pct

## 2012-07-26 NOTE — Assessment & Plan Note (Signed)
Her heart failure symptoms are class II. I've encouraged the patient to reduce her salt intake, increase her physical activity, and continue her current medical therapy.

## 2012-07-26 NOTE — Assessment & Plan Note (Signed)
Her blood pressure is elevated today. She notes that she missed taking her medications this morning that she was coming to the doctor. I've encouraged her to take her medications as she gets home, and to reduce her sodium intake. I've also asked the patient to check her blood pressure are regular basis, and record the date.

## 2012-07-26 NOTE — Progress Notes (Signed)
HPI [name] returns today for followup. She is a very pleasant 61 year old woman with a nonischemic cardiomyopathy, obesity, hypertension, diabetes, and COPD. She has done well in the interim. Her blood pressure is high today but she notes that she did not take her medications. She denies chest pain, shortness of breath, syncope, or peripheral edema. She has been fairly sedentary. She states that she walks 2 or 3 times a week about 10 minutes at a time. Allergies  Allergen Reactions  . Avandia (Rosiglitazone Maleate)   . Hydrocortisone   . Pioglitazone     REACTION: congestive heart failure ACTOS     Current Outpatient Prescriptions  Medication Sig Dispense Refill  . albuterol (VENTOLIN HFA) 108 (90 BASE) MCG/ACT inhaler Inhale 2 puffs into the lungs every 6 (six) hours as needed. Shortness of breath      . aspirin 81 MG tablet Take 81 mg by mouth daily.        [name] atorvastatin (LIPITOR) 40 MG tablet Take 40 mg by mouth at bedtime.      . citalopram (CELEXA) 20 MG tablet Take 20 mg by mouth daily.      . Fluticasone-Salmeterol (ADVAIR) 250-50 MCG/DOSE AEPB Inhale 1 puff into the lungs every 12 (twelve) hours.      . furosemide (LASIX) 40 MG tablet Take 40 mg by mouth daily.      [name] gabapentin (NEURONTIN) 300 MG capsule Take 300 mg by mouth daily.      . insulin aspart (NOVOLOG) 100 UNIT/ML injection Inject 3-20 Units into the skin 3 (three) times daily before meals. Sliding scale      . insulin glargine (LANTUS) 100 UNIT/ML injection Inject 44 Units into the skin 2 (two) times daily.      [name] ketoconazole (NIZORAL) 2 % shampoo Apply 1 application topically 2 (two) times a week.      [name] lisinopril (PRINIVIL,ZESTRIL) 40 MG tablet Take 40 mg by mouth daily.      . metoprolol (TOPROL-XL) 200 MG 24 hr tablet Take 200 mg by mouth daily.      . naproxen (NAPROSYN) 500 MG tablet Take 500 mg by mouth 2 (two) times daily with a meal.      . nitroGLYCERIN (NITROSTAT) 0.4 MG SL tablet Place 1 tablet (0.4  mg total) under the tongue every 5 (five) minutes as needed for chest pain.  25 tablet  3  . potassium chloride SA (K-DUR,KLOR-CON) 20 MEQ tablet Take 1 tablet (20 mEq total) by mouth daily.  30 tablet  3  . spironolactone (ALDACTONE) 25 MG tablet Take 25 mg by mouth daily.        [name] triamcinolone (KENALOG) 0.1 % ointment Apply 1 application topically as needed. rash         Past Medical History  Diagnosis Date  . Hyperthyroidism, subclinical   . Post menopausal syndrome   . Sleep apnea   . Obesity   . Cardiac defibrillator in situ     Atlas II VR (SJM) implanted by Dr Lovena Le  . Eczema   . Depression   . Lipoma   . Chronic ulcer of leg   . Hyperlipidemia   . Chronic systolic congestive heart failure   . Cardiomyopathy     nonischemic  . Diabetes mellitus   . HTN (hypertension)   . COPD (chronic obstructive pulmonary disease)   . Diabetes mellitus   . CHF (congestive heart failure)   . Hypertension   . Pacemaker  ROS:   All systems reviewed and negative except as noted in the HPI.   Past Surgical History  Procedure Date  . Tubal ligation   . Cardiac defibrillator placement 05/04/2007    SJM Atlas II VR ICD  . Hysteroscopy   . Cardiac defibrillator placement   . Abdominal hysterectomy   . Hernia repair   . Insert / replace / remove pacemaker      Family History  Problem Relation Age of Onset  . Stroke Mother   . Seizures Father   . Diabetes Sister   . Asthma Maternal Aunt     aunts  . Asthma Maternal Uncle     uncles  . Heart disease Father   . Heart disease Paternal Aunt     aunts  . Heart disease Paternal Uncle     uncles  . Heart disease Maternal Aunt     aunts  . Heart disease Maternal Uncle     uncles  . Heart disease Maternal Grandfather      History   Social History  . Marital Status: Married    Spouse Name: Alroy Dust    Number of Children: N/A  . Years of Education: N/A   Occupational History  . disabled    Social History Main  Topics  . Smoking status: Current Every Day Smoker -- 0.2 packs/day for 35 years    Types: Cigarettes  . Smokeless tobacco: Not on file   Comment: currently smoking 3 cigs daily.   . Alcohol Use: 1.0 oz/week     Quit 05/2007  . Drug Use: No  . Sexually Active: Not on file   Other Topics Concern  . Not on file   Social History Narrative   ** Merged History Encounter ** Married     BP 184/103  Pulse 88  Ht 5' (1.524 m)  Wt 243 lb 3.2 oz (110.315 kg)  BMI 47.50 kg/m2  Physical Exam:  Well appearing middle-aged woman, NAD HEENT: Unremarkable Neck:  No JVD, no thyromegally Lungs:  Clear with no wheezes, rales, or rhonchi. HEART:  Regular rate rhythm, no murmurs, no rubs, no clicks Abd:  soft, positive bowel sounds, no organomegally, no rebound, no guarding Ext:  2 plus pulses, no edema, no cyanosis, no clubbing Skin:  No rashes no nodules Neuro:  CN II through XII intact, motor grossly intact  DEVICE  Normal device function.  See PaceArt for details.   Assess/Plan:

## 2012-07-26 NOTE — Patient Instructions (Signed)
Your physician wants you to follow-up in: [name] will receive a reminder letter in the mail two months in advance. If you don't receive a letter, please call our office to schedule the follow-up appointment.

## 2012-07-26 NOTE — Assessment & Plan Note (Signed)
Her St. Jude ICD is working normally. We'll plan to recheck in several months.

## 2012-08-31 ENCOUNTER — Emergency Department (HOSPITAL_COMMUNITY)
Admission: EM | Admit: 2012-08-31 | Discharge: 2012-08-31 | Disposition: A | Discharge status: Home / Self Care | Payer: Medicare Other | Attending: Emergency Medicine | Admitting: Emergency Medicine

## 2012-08-31 ENCOUNTER — Emergency Department (HOSPITAL_COMMUNITY): Payer: Medicare Other

## 2012-08-31 ENCOUNTER — Encounter (HOSPITAL_COMMUNITY): Payer: Self-pay | Admitting: *Deleted

## 2012-08-31 DIAGNOSIS — Z79899 Other long term (current) drug therapy: Secondary | ICD-10-CM | POA: Insufficient documentation

## 2012-08-31 DIAGNOSIS — Z794 Long term (current) use of insulin: Secondary | ICD-10-CM | POA: Insufficient documentation

## 2012-08-31 DIAGNOSIS — E785 Hyperlipidemia, unspecified: Secondary | ICD-10-CM | POA: Insufficient documentation

## 2012-08-31 DIAGNOSIS — R0601 Orthopnea: Secondary | ICD-10-CM | POA: Insufficient documentation

## 2012-08-31 DIAGNOSIS — Z95810 Presence of automatic (implantable) cardiac defibrillator: Secondary | ICD-10-CM | POA: Insufficient documentation

## 2012-08-31 DIAGNOSIS — F329 Major depressive disorder, single episode, unspecified: Secondary | ICD-10-CM | POA: Insufficient documentation

## 2012-08-31 DIAGNOSIS — E119 Type 2 diabetes mellitus without complications: Secondary | ICD-10-CM | POA: Insufficient documentation

## 2012-08-31 DIAGNOSIS — E669 Obesity, unspecified: Secondary | ICD-10-CM | POA: Insufficient documentation

## 2012-08-31 DIAGNOSIS — Z7982 Long term (current) use of aspirin: Secondary | ICD-10-CM | POA: Insufficient documentation

## 2012-08-31 DIAGNOSIS — D179 Benign lipomatous neoplasm, unspecified: Secondary | ICD-10-CM | POA: Insufficient documentation

## 2012-08-31 DIAGNOSIS — I5022 Chronic systolic (congestive) heart failure: Secondary | ICD-10-CM | POA: Insufficient documentation

## 2012-08-31 DIAGNOSIS — L97909 Non-pressure chronic ulcer of unspecified part of unspecified lower leg with unspecified severity: Secondary | ICD-10-CM | POA: Insufficient documentation

## 2012-08-31 DIAGNOSIS — F17200 Nicotine dependence, unspecified, uncomplicated: Secondary | ICD-10-CM | POA: Insufficient documentation

## 2012-08-31 DIAGNOSIS — I10 Essential (primary) hypertension: Secondary | ICD-10-CM | POA: Insufficient documentation

## 2012-08-31 DIAGNOSIS — J449 Chronic obstructive pulmonary disease, unspecified: Secondary | ICD-10-CM | POA: Insufficient documentation

## 2012-08-31 DIAGNOSIS — E0590 Thyrotoxicosis, unspecified without thyrotoxic crisis or storm: Secondary | ICD-10-CM | POA: Insufficient documentation

## 2012-08-31 DIAGNOSIS — I509 Heart failure, unspecified: Secondary | ICD-10-CM

## 2012-08-31 DIAGNOSIS — F3289 Other specified depressive episodes: Secondary | ICD-10-CM | POA: Insufficient documentation

## 2012-08-31 DIAGNOSIS — J4489 Other specified chronic obstructive pulmonary disease: Secondary | ICD-10-CM | POA: Insufficient documentation

## 2012-08-31 DIAGNOSIS — Z872 Personal history of diseases of the skin and subcutaneous tissue: Secondary | ICD-10-CM | POA: Insufficient documentation

## 2012-08-31 LAB — CBC WITH DIFFERENTIAL/PLATELET
Basophils Absolute: 0.1 10*3/uL (ref 0.0–0.1)
Basophils Relative: 0 % (ref 0–1)
Eosinophils Absolute: 0.3 10*3/uL (ref 0.0–0.7)
Eosinophils Relative: 2 % (ref 0–5)
HCT: 42.6 % (ref 36.0–46.0)
Hemoglobin: 13.8 g/dL (ref 12.0–15.0)
Lymphocytes Relative: 17 % (ref 12–46)
Lymphs Abs: 2 10*3/uL (ref 0.7–4.0)
MCH: 26.4 pg (ref 26.0–34.0)
MCHC: 32.4 g/dL (ref 30.0–36.0)
MCV: 81.6 fL (ref 78.0–100.0)
Monocytes Absolute: 0.7 10*3/uL (ref 0.1–1.0)
Monocytes Relative: 6 % (ref 3–12)
Neutro Abs: 9.2 10*3/uL — ABNORMAL HIGH (ref 1.7–7.7)
Neutrophils Relative %: 75 % (ref 43–77)
Platelets: 259 10*3/uL (ref 150–400)
RBC: 5.22 MIL/uL — ABNORMAL HIGH (ref 3.87–5.11)
RDW: 13.9 % (ref 11.5–15.5)
WBC: 12.3 10*3/uL — ABNORMAL HIGH (ref 4.0–10.5)

## 2012-08-31 LAB — COMPREHENSIVE METABOLIC PANEL
ALT: 16 U/L (ref 0–35)
AST: 15 U/L (ref 0–37)
Albumin: 3.6 g/dL (ref 3.5–5.2)
Alkaline Phosphatase: 150 U/L — ABNORMAL HIGH (ref 39–117)
BUN: 7 mg/dL (ref 6–23)
CO2: 26 mEq/L (ref 19–32)
Calcium: 9.7 mg/dL (ref 8.4–10.5)
Chloride: 101 mEq/L (ref 96–112)
Creatinine, Ser: 0.53 mg/dL (ref 0.50–1.10)
GFR calc Af Amer: >90 mL/min (ref >90)
GFR calc non Af Amer: >90 mL/min (ref >90)
Glucose, Bld: 189 mg/dL — ABNORMAL HIGH (ref 70–99)
Potassium: 3.4 mEq/L — ABNORMAL LOW (ref 3.5–5.1)
Sodium: 139 mEq/L (ref 135–145)
Total Bilirubin: 0.4 mg/dL (ref 0.3–1.2)
Total Protein: 7.6 g/dL (ref 6.0–8.3)

## 2012-08-31 LAB — PRO B NATRIURETIC PEPTIDE: Pro B Natriuretic peptide (BNP): 896.7 pg/mL — ABNORMAL HIGH (ref 0–125)

## 2012-08-31 LAB — TROPONIN I: Troponin I: <0.3 ng/mL (ref <0.30)

## 2012-08-31 MED ORDER — NITROGLYCERIN 2 % TD OINT
1.0000 [in_us] | TOPICAL_OINTMENT | Freq: Once | TRANSDERMAL | Status: AC
  Administered 2012-08-31: 1 [in_us] via TOPICAL
  Filled 2012-08-31: qty 1

## 2012-08-31 MED ORDER — FUROSEMIDE 10 MG/ML IJ SOLN
80.0000 mg | Freq: Once | INTRAMUSCULAR | Status: AC
  Administered 2012-08-31: 80 mg via INTRAVENOUS
  Filled 2012-08-31: qty 8

## 2012-08-31 MED ORDER — SODIUM CHLORIDE 0.9 % IV SOLN
Freq: Once | INTRAVENOUS | Status: AC
  Administered 2012-08-31: 21:00:00 via INTRAVENOUS

## 2012-08-31 NOTE — ED Notes (Signed)
Productive cough white clear

## 2012-08-31 NOTE — ED Notes (Signed)
The pt has had sob for one week.  She has been getting hhn and is on home 02.  Hx of chf. Minimal mid-chest pain

## 2012-08-31 NOTE — ED Provider Notes (Signed)
History     CSN: [id number]  Arrival date & time 08/31/12  1839   First MD Initiated Contact with Patient 08/31/12 2016      Chief Complaint  Patient presents with  . Shortness of Breath    (Consider location/radiation/quality/duration/timing/severity/associated sxs/prior treatment) Patient is a 61 y.o. female presenting with shortness of breath. The history is provided by the patient and medical records. No language interpreter was used.  Shortness of Breath  The current episode started 3 to 5 days ago (Pt has had shortness of breath and wheezing for 3 days.). The onset was gradual. The problem occurs occasionally. The problem has been gradually worsening. The problem is moderate. Nothing relieves the symptoms. The symptoms are aggravated by activity. Associated symptoms include orthopnea, shortness of breath and wheezing. Pertinent negatives include no fever. She has had prior hospitalizations. She has had no prior ICU admissions. She has had no prior intubations. Past medical history comments: Nonischemic cardiomyopathy, hypertension, diabetes.. Recently, medical care has been given by a specialist. Services received include medications given.    Past Medical History  Diagnosis Date  . Hyperthyroidism, subclinical   . Post menopausal syndrome   . Sleep apnea   . Obesity   . Cardiac defibrillator in situ     Atlas II VR (SJM) implanted by Dr Lovena Le  . Eczema   . Depression   . Lipoma   . Chronic ulcer of leg   . Hyperlipidemia   . Chronic systolic congestive heart failure   . Cardiomyopathy     nonischemic  . Diabetes mellitus   . HTN (hypertension)   . COPD (chronic obstructive pulmonary disease)   . Diabetes mellitus   . CHF (congestive heart failure)   . Hypertension   . Pacemaker     Past Surgical History  Procedure Date  . Tubal ligation   . Cardiac defibrillator placement 05/04/2007    SJM Atlas II VR ICD  . Hysteroscopy   . Cardiac defibrillator placement    . Abdominal hysterectomy   . Hernia repair   . Insert / replace / remove pacemaker     Family History  Problem Relation Age of Onset  . Stroke Mother   . Seizures Father   . Diabetes Sister   . Asthma Maternal Aunt     aunts  . Asthma Maternal Uncle     uncles  . Heart disease Father   . Heart disease Paternal Aunt     aunts  . Heart disease Paternal Uncle     uncles  . Heart disease Maternal Aunt     aunts  . Heart disease Maternal Uncle     uncles  . Heart disease Maternal Grandfather     History  Substance Use Topics  . Smoking status: Current Every Day Smoker -- 0.2 packs/day for 35 years    Types: Cigarettes  . Smokeless tobacco: Not on file     Comment: currently smoking 3 cigs daily.   . Alcohol Use: 1.0 oz/week     Comment: Quit 05/2007    OB History    Grav Para Term Preterm Abortions TAB SAB Ect Mult Living                  Review of Systems  Constitutional: Negative.  Negative for fever and chills.  HENT: Negative.   Eyes: Negative.   Respiratory: Positive for shortness of breath and wheezing.   Cardiovascular: Positive for orthopnea. Negative for leg swelling.  Gastrointestinal:  Negative.   Genitourinary: Negative.   Musculoskeletal: Negative.   Skin: Negative.   Neurological: Negative.   Psychiatric/Behavioral: Negative.     Allergies  Avandia; Hydrocortisone; and Pioglitazone  Home Medications   Current Outpatient Rx  Name  Route  Sig  Dispense  Refill  . ALBUTEROL SULFATE HFA 108 (90 BASE) MCG/ACT IN AERS   Inhalation   Inhale 2 puffs into the lungs every 6 (six) hours as needed. Shortness of breath         . ASPIRIN 81 MG PO TABS   Oral   Take 81 mg by mouth daily.           . ATORVASTATIN CALCIUM 40 MG PO TABS   Oral   Take 40 mg by mouth at bedtime.         [name] CITALOPRAM HYDROBROMIDE 20 MG PO TABS   Oral   Take 20 mg by mouth daily.         [name] FLUTICASONE-SALMETEROL 250-50 MCG/DOSE IN AEPB   Inhalation   Inhale  1 puff into the lungs every 12 (twelve) hours.         . FUROSEMIDE 40 MG PO TABS   Oral   Take 40 mg by mouth daily.         [name] GABAPENTIN 300 MG PO CAPS   Oral   Take 300 mg by mouth daily.         . INSULIN ASPART 100 UNIT/ML [place] SOLN   Subcutaneous   Inject 3-20 Units into the skin 3 (three) times daily before meals. Sliding scale         . INSULIN GLARGINE 100 UNIT/ML [place] SOLN   Subcutaneous   Inject 44 Units into the skin 2 (two) times daily.         [name] KETOCONAZOLE 2 % EX SHAM   Topical   Apply 1 application topically 2 (two) times a week.         [name] LISINOPRIL 40 MG PO TABS   Oral   Take 40 mg by mouth daily.         [name] METOPROLOL SUCCINATE ER 200 MG PO TB24   Oral   Take 200 mg by mouth daily.         [name] NAPROXEN 500 MG PO TABS   Oral   Take 500 mg by mouth 2 (two) times daily with a meal.         . NITROGLYCERIN 0.4 MG SL SUBL   Sublingual   Place 1 tablet (0.4 mg total) under the tongue every 5 (five) minutes as needed for chest pain.   25 tablet   3   . POTASSIUM CHLORIDE CRYS ER 20 MEQ PO TBCR   Oral   Take 1 tablet (20 mEq total) by mouth daily.   30 tablet   3   . SPIRONOLACTONE 25 MG PO TABS   Oral   Take 25 mg by mouth daily.           . TRIAMCINOLONE ACETONIDE 0.1 % EX OINT   Topical   Apply 1 application topically as needed. rash           BP 166/80  Pulse 100  Temp 97 F (36.1 C) (Oral)  Resp 30  SpO2 98%  Physical Exam  Nursing note and vitals reviewed. Constitutional: She is oriented to person, place, and time. She appears well-developed and well-nourished. No distress.  HENT:  Head: Normocephalic and atraumatic.  Right Ear: External ear normal.  Left Ear: External ear normal.  Mouth/Throat: Oropharynx is clear and moist.  Eyes: Conjunctivae normal and EOM are normal.  Neck: Normal range of motion. Neck supple. No JVD present.  Cardiovascular: Normal rate, regular rhythm and normal heart sounds.     Pulmonary/Chest:       Rales at left base.  Abdominal: Soft. Bowel sounds are normal.  Musculoskeletal: Normal range of motion. She exhibits no edema and no tenderness.  Lymphadenopathy:    She has no cervical adenopathy.  Neurological: She is alert and oriented to person, place, and time.       No sensory or motor deficit.  Skin: Skin is warm and dry.  Psychiatric: She has a normal mood and affect. Her behavior is normal.    ED Course  Procedures (including critical care time)   Labs Reviewed  CBC WITH DIFFERENTIAL  COMPREHENSIVE METABOLIC PANEL  TROPONIN I  PRO B NATRIURETIC PEPTIDE   Dg Chest 2 View  08/31/2012  *RADIOLOGY REPORT*  Clinical Data: Oxygen dependent patient presenting with shortness of breath.  Current history of hypertension and diabetes.  Prior history of CHF.  CHEST - 2 VIEW  Comparison: Two-view chest x-ray 01/17/2012, 02/24/2009.  CTA chest 10/04/2009.  Findings: Cardiac silhouette moderately enlarged but stable. Interval development of diffuse interstitial pulmonary edema throughout both lungs.  Focal airspace opacities in the right upper lobe, without airspace consolidation elsewhere.  Probable small right pleural effusion.  Left subclavian pacing defibrillator unchanged and appears intact.  Visualized bony thorax intact.  IMPRESSION:  1.  Mild CHF, with stable moderate cardiomegaly and mild diffuse interstitial pulmonary edema. 2.  Asymmetric airspace pulmonary edema versus pneumonia involving the right upper lobe.   Original Report Authenticated By: Evangeline Dakin, M.D.    Results for orders placed during the hospital encounter of 08/31/12  CBC WITH DIFFERENTIAL      Component Value Range   WBC 12.3 (*) 4.0 - 10.5 K/uL   RBC 5.22 (*) 3.87 - 5.11 MIL/uL   Hemoglobin 13.8  12.0 - 15.0 g/dL   HCT 42.6  36.0 - 46.0 %   MCV 81.6  78.0 - 100.0 fL   MCH 26.4  26.0 - 34.0 pg   MCHC 32.4  30.0 - 36.0 g/dL   RDW 13.9  11.5 - 15.5 %   Platelets 259  150 - 400  K/uL   Neutrophils Relative 75  43 - 77 %   Neutro Abs 9.2 (*) 1.7 - 7.7 K/uL   Lymphocytes Relative 17  12 - 46 %   Lymphs Abs 2.0  0.7 - 4.0 K/uL   Monocytes Relative 6  3 - 12 %   Monocytes Absolute 0.7  0.1 - 1.0 K/uL   Eosinophils Relative 2  0 - 5 %   Eosinophils Absolute 0.3  0.0 - 0.7 K/uL   Basophils Relative 0  0 - 1 %   Basophils Absolute 0.1  0.0 - 0.1 K/uL  COMPREHENSIVE METABOLIC PANEL      Component Value Range   Sodium 139  135 - 145 mEq/L   Potassium 3.4 (*) 3.5 - 5.1 mEq/L   Chloride 101  96 - 112 mEq/L   CO2 26  19 - 32 mEq/L   Glucose, Bld 189 (*) 70 - 99 mg/dL   BUN 7  6 - 23 mg/dL   Creatinine, Ser 0.53  0.50 - 1.10 mg/dL   Calcium 9.7  8.4 - 10.5 mg/dL  Total Protein 7.6  6.0 - 8.3 g/dL   Albumin 3.6  3.5 - 5.2 g/dL   AST 15  0 - 37 U/L   ALT 16  0 - 35 U/L   Alkaline Phosphatase 150 (*) 39 - 117 U/L   Total Bilirubin 0.4  0.3 - 1.2 mg/dL   GFR calc non Af Amer >90  >90 mL/min   GFR calc Af Amer >90  >90 mL/min  TROPONIN I      Component Value Range   Troponin I <0.30  <0.30 ng/mL  PRO B NATRIURETIC PEPTIDE      Component Value Range   Pro B Natriuretic peptide (BNP) 896.7 (*) 0 - 125 pg/mL   Dg Chest 2 View  08/31/2012  *RADIOLOGY REPORT*  Clinical Data: Oxygen dependent patient presenting with shortness of breath.  Current history of hypertension and diabetes.  Prior history of CHF.  CHEST - 2 VIEW  Comparison: Two-view chest x-ray 01/17/2012, 02/24/2009.  CTA chest 10/04/2009.  Findings: Cardiac silhouette moderately enlarged but stable. Interval development of diffuse interstitial pulmonary edema throughout both lungs.  Focal airspace opacities in the right upper lobe, without airspace consolidation elsewhere.  Probable small right pleural effusion.  Left subclavian pacing defibrillator unchanged and appears intact.  Visualized bony thorax intact.  IMPRESSION:  1.  Mild CHF, with stable moderate cardiomegaly and mild diffuse interstitial pulmonary  edema. 2.  Asymmetric airspace pulmonary edema versus pneumonia involving the right upper lobe.   Original Report Authenticated By: Evangeline Dakin, M.D.     Lab workup shows mild CHF.  Advised to increase Lasix to 80 mg per day for 3 days.  F/U with [facility] in the office next week.  1. Congestive heart failure         Mylinda Latina III, MD 09/01/12 1036

## 2012-09-11 ENCOUNTER — Encounter: Payer: Self-pay | Admitting: Physician Assistant

## 2012-09-11 ENCOUNTER — Ambulatory Visit (INDEPENDENT_AMBULATORY_CARE_PROVIDER_SITE_OTHER): Payer: Medicare Other | Admitting: Physician Assistant

## 2012-09-11 VITALS — BP 144/86 | HR 77 | Ht 60.0 in | Wt 237.1 lb

## 2012-09-11 DIAGNOSIS — I428 Other cardiomyopathies: Secondary | ICD-10-CM

## 2012-09-11 DIAGNOSIS — Z95810 Presence of automatic (implantable) cardiac defibrillator: Secondary | ICD-10-CM

## 2012-09-11 DIAGNOSIS — Z79899 Other long term (current) drug therapy: Secondary | ICD-10-CM

## 2012-09-11 DIAGNOSIS — I5042 Chronic combined systolic (congestive) and diastolic (congestive) heart failure: Secondary | ICD-10-CM

## 2012-09-11 DIAGNOSIS — I10 Essential (primary) hypertension: Secondary | ICD-10-CM

## 2012-09-11 DIAGNOSIS — E785 Hyperlipidemia, unspecified: Secondary | ICD-10-CM

## 2012-09-11 LAB — BASIC METABOLIC PANEL
BUN: 16 mg/dL (ref 6–23)
CO2: 30 mEq/L (ref 19–32)
Calcium: 8.6 mg/dL (ref 8.4–10.5)
Chloride: 99 mEq/L (ref 96–112)
Creatinine, Ser: 1 mg/dL (ref 0.4–1.2)
GFR: 77.45 mL/min (ref >60.00)
Glucose, Bld: 296 mg/dL — ABNORMAL HIGH (ref 70–99)
Potassium: 3.7 mEq/L (ref 3.5–5.1)
Sodium: 137 mEq/L (ref 135–145)

## 2012-09-11 MED ORDER — FUROSEMIDE 40 MG PO TABS: 60.0000 mg | ORAL_TABLET | Freq: Every day | ORAL | Status: DC

## 2012-09-11 NOTE — Patient Instructions (Signed)
Your physician recommends that you schedule a follow-up appointment in: FEB 2014 WITH DR [place] Your physician has recommended you make the following change in your medication: [place]  Your physician recommends that you return for lab work in:  [place] V58.69  Your physician recommends that you weigh, daily, at the same time every day, and in the same amount of clothing. Please record your daily weights on the handout provided and bring it to your next appointment.   CALL IF 3 LB WEIGHT GAIN IN 24 HOUR PERIOD  OR 5  LB  GAIN IN  1 WEEK 2 Gram Low Sodium Diet A 2 gram sodium diet restricts the amount of sodium in the diet to no more than 2 g or 2000 mg daily. Limiting the amount of sodium is often used to help lower blood pressure. It is important if you have heart, liver, or kidney problems. Many foods contain sodium for flavor and sometimes as a preservative. When the amount of sodium in a diet needs to be low, it is important to know what to look for when choosing foods and drinks. The following includes some information and guidelines to help make it easier for you to adapt to a low sodium diet. QUICK TIPS  Do not add salt to food.  Avoid convenience items and fast food.  Choose unsalted snack foods.  Buy lower sodium products, often labeled as "lower sodium" or "no salt added."  Check food labels to learn how much sodium is in 1 serving.  When eating at a restaurant, ask that your food be prepared with less salt or none, if possible. READING FOOD LABELS FOR SODIUM INFORMATION The nutrition facts label is a good place to find how much sodium is in foods. Look for products with no more than 500 to 600 mg of sodium per meal and no more than 150 mg per serving. Remember that 2 g = 2000 mg. The food label may also list foods as:  Sodium-free: Less than 5 mg in a serving.  Very low sodium: 35 mg or less in a serving.  Low-sodium: 140 mg or  less in a serving.  Light in sodium: 50% less sodium in a serving. For example, if a food that usually has 300 mg of sodium is changed to become light in sodium, it will have 150 mg of sodium.  Reduced sodium: 25% less sodium in a serving. For example, if a food that usually has 400 mg of sodium is changed to reduced sodium, it will have 300 mg of sodium. CHOOSING FOODS Grains  Avoid: Salted crackers and snack items. Some cereals, including instant hot cereals. Bread stuffing and biscuit mixes. Seasoned rice or pasta mixes.  Choose: Unsalted snack items. Low-sodium cereals, oats, puffed wheat and rice, shredded wheat. English muffins and bread. Pasta. Meats  Avoid: Salted, canned, smoked, spiced, pickled meats, including fish and poultry. Bacon, ham, sausage, cold cuts, hot dogs, anchovies.  Choose: Low-sodium canned tuna and salmon. Fresh or frozen meat, poultry, and fish. Dairy  Avoid: Processed cheese and spreads. Cottage cheese. Buttermilk and condensed milk. Regular cheese.  Choose: Milk. Low-sodium cottage cheese. Yogurt. Sour cream. Low-sodium cheese. Fruits and Vegetables  Avoid: Regular canned vegetables. Regular canned tomato sauce and paste. Frozen vegetables in sauces. Olives. Angie Fava. Relishes. Sauerkraut.  Choose: Low-sodium canned vegetables. Low-sodium tomato sauce and paste. Frozen or fresh vegetables. Fresh and frozen fruit. Condiments  Avoid: Canned and  packaged gravies. Worcestershire sauce. Tartar sauce. Barbecue sauce. Soy sauce. Steak sauce. Ketchup. Onion, garlic, and table salt. Meat flavorings and tenderizers.  Choose: Fresh and dried herbs and spices. Low-sodium varieties of mustard and ketchup. Lemon juice. Tabasco sauce. Horseradish. SAMPLE 2 GRAM SODIUM MEAL PLAN Breakfast / Sodium (mg)  1 cup low-fat milk / [id number] mg  2 slices whole-wheat toast / 270 mg  1 tbs heart-healthy margarine / 153 mg  1 hard-boiled egg / 139 mg  1 small orange / 0  mg Lunch / Sodium (mg)  1 cup raw carrots / 76 mg   cup hummus / 298 mg  1 cup low-fat milk / 143 mg   cup red grapes / 2 mg  1 whole-wheat pita bread / 356 mg Dinner / Sodium (mg)  1 cup whole-wheat pasta / 2 mg  1 cup low-sodium tomato sauce / 73 mg  3 oz lean ground beef / 57 mg  1 small side salad (1 cup raw spinach leaves,  cup cucumber,  cup yellow bell pepper) with 1 tsp olive oil and 1 tsp red wine vinegar / 25 mg Snack / Sodium (mg)  1 container low-fat vanilla yogurt / 107 mg  3 graham cracker squares / 127 mg Nutrient Analysis  Calories: 2033  Protein: 77 g  Carbohydrate: 282 g  Fat: 72 g  Sodium: 1971 mg Document Released: 09/19/2005 Document Revised: 12/12/2011 Document Reviewed: 12/21/2009 [facility] Patient Information 2013 [place].

## 2012-09-11 NOTE — Progress Notes (Signed)
[address], [place], [place]  [address] Phone: [phone], Fax:  [phone]  Date:  09/11/2012   Name:  DIMITRI HINDERMAN   DOB:  [birth year]   MRN:  [id number]  PCP:  William Hamburger, MD  Primary Cardiologist:  Dr. Kirk Ruths    Primary Electrophysiologist:  Dr. Cristopher Peru    History of Present Illness: BRISSIA QUILES is a 61 y.o. female who returns for followup after recent visit to the emergency room for acute on chronic combined systolic and diastolic CHF.  She has a hx of NICM with a pror EF of 123456, chronic systolic CHF, HTN, HL, DM2, s/p AICD implantation. Myoview 2008:  no ischemia, EF 33%.  LHC 6/13:  Mild calcification in the LM, o/w normal coronary arteries, EF 45%.  Echo 6/12:  EF of 55%.  Echo 7/13:  EF 45-50%, Gr 2 diast dysfn, mild AI, mild MAC, trivial MR, mild LAE, PASP 47.  Last admitted 6/13 for chest pain and a/c systolic CHF.  Presented to the ED 08/31/12 with dyspnea.  BNP was elevated and she had edema on CXR.  Lasix was increased by the EDP and she was asked to follow up here.  She notes improved LE edema and dyspnea.  She feels very close to baseline.  No orthopnea, PND.  No syncope.  No chest pain.  Describes Class IIb symptoms.  No ICD Rx.  Labs (2/12):    LDL 93 Labs (8/13):    K 4.7, creatinine 1.2, ALT 20 Labs (11/13):  K 3.4, creatinine 0.53, ALT 16, Hgb 13.8, proBNP 897  CXR 08/31/12:  IMPRESSION:  1. Mild CHF, with stable moderate cardiomegaly and mild diffuse interstitial pulmonary edema.  2. Asymmetric airspace pulmonary edema versus pneumonia involving the right upper lobe.  Wt Readings from Last 3 Encounters:  09/11/12 237 lb 1.9 oz (107.557 kg)  07/26/12 243 lb 3.2 oz (110.315 kg)  05/08/12 237 lb 1.9 oz (107.557 kg)     Past Medical History  Diagnosis Date  . Hyperthyroidism, subclinical   . Post menopausal syndrome   . Sleep apnea   . Obesity   . Cardiac defibrillator in situ     Atlas II VR (SJM) implanted  by Dr Lovena Le  . Eczema   . Depression   . Lipoma   . Chronic ulcer of leg   . Hyperlipidemia   . Chronic combined systolic and diastolic heart failure     a. EF 35-40% in past;  b. Echo 7/13:  EF 45-50%, Gr 2 diast dysfn, mild AI, mild MAC, trivial MR, mild LAE, PASP 47.  [name] NICM (nonischemic cardiomyopathy)   . Diabetes mellitus   . HTN (hypertension)   . COPD (chronic obstructive pulmonary disease)   . Elevated alkaline phosphatase level     GGT and 5'nucleotidase 8/13 normal  . Hx of cardiac cath     a. [place] 2003 normal;  b. LHC 6/13:  Mild calcification in the LM, o/w normal coronary arteries, EF 45%.     Current Outpatient Prescriptions  Medication Sig Dispense Refill  . albuterol (VENTOLIN HFA) 108 (90 BASE) MCG/ACT inhaler Inhale 2 puffs into the lungs every 6 (six) hours as needed. Shortness of breath      . aspirin 81 MG tablet Take 81 mg by mouth daily.        [name] atorvastatin (LIPITOR) 40 MG tablet Take 40 mg by mouth at bedtime.      . citalopram (CELEXA)  20 MG tablet Take 20 mg by mouth daily.      . Fluticasone-Salmeterol (ADVAIR) 250-50 MCG/DOSE AEPB Inhale 1 puff into the lungs every 12 (twelve) hours.      . furosemide (LASIX) 40 MG tablet Take 40 mg by mouth daily.      [name] gabapentin (NEURONTIN) 300 MG capsule Take 300 mg by mouth at bedtime.       . insulin aspart (NOVOLOG) 100 UNIT/ML injection Inject 3-20 Units into the skin 3 (three) times daily before meals. Sliding scale      . insulin glargine (LANTUS) 100 UNIT/ML injection Inject 44 Units into the skin 2 (two) times daily.      [name] ketoconazole (NIZORAL) 2 % shampoo Apply 1 application topically 3 (three) times a week.       [name] lisinopril (PRINIVIL,ZESTRIL) 40 MG tablet Take 40 mg by mouth daily.      . metoprolol (TOPROL-XL) 200 MG 24 hr tablet Take 200 mg by mouth daily.      . naproxen (NAPROSYN) 500 MG tablet Take 500 mg by mouth 2 (two) times daily with a meal.      . nitroGLYCERIN (NITROSTAT) 0.4 MG SL tablet  Place 1 tablet (0.4 mg total) under the tongue every 5 (five) minutes as needed for chest pain.  25 tablet  3  . spironolactone (ALDACTONE) 25 MG tablet Take 25 mg by mouth daily.        [name] triamcinolone (KENALOG) 0.1 % ointment Apply 1 application topically as needed. rash        Allergies: Allergies  Allergen Reactions  . Avandia (Rosiglitazone Maleate)     unknown  . Hydrocortisone     unknown  . Pioglitazone     REACTION: congestive heart failure ACTOS    Social History:  The patient  reports that she has been smoking Cigarettes.  She has a 8.75 pack-year smoking history. She does not have any smokeless tobacco history on file. She reports that she drinks about one ounce of alcohol per week. She reports that she does not use illicit drugs.   ROS:  Please see the history of present illness.   All other systems reviewed and negative.   PHYSICAL EXAM: VS:  BP 144/86  Pulse 77  Ht 5' (1.524 m)  Wt 237 lb 1.9 oz (107.557 kg)  BMI 46.31 kg/m2 Well nourished, well developed, in no acute distress HEENT: normal Neck: minimally elevated JVD Cardiac:  normal S1, S2; RRR; no murmur; no S3 Lungs:  clear to auscultation bilaterally, no wheezing, rhonchi or rales Abd: soft, nontender, no hepatomegaly Ext: trace to 1+ bilateral LE edema Skin: warm and dry Neuro:  CNs 2-12 intact, no focal abnormalities noted  EKG:  NSR, HR 77, normal axis, TWI 1, aVL, V4-6, no change from prior tracings  ASSESSMENT AND PLAN:  1. Chronic Combined Systolic and Diastolic CHF:  She felt better on Lasix 80 QD.  Weight is better.  Lungs are clear.  She probably still has a small amount of fluid to lose.  Increase Lasix to 60 mg QD.  Check BMET today and repeat in one week.  I will give her a low Na diet to follow.  We discussed weighing daily and when to call for weight gain.    2. Non-Ischemic Cardiomyopathy:   Continue current dose of beta blocker, ACE and spironolactone.  3. S/p AICD:  Follow up with EP  as planned.  4. Hypertension:  Uncontrolled.  Adjust Lasix  as noted.    5. Hyperlipidemia:   Continue statin.    6. Disposition:   Follow up with Dr. Kirk Ruths in 11/2012 as previously planned.  Signed, Richardson Dopp, PA-C  12:19 PM 09/11/2012

## 2012-09-12 ENCOUNTER — Telehealth: Payer: Self-pay | Admitting: *Deleted

## 2012-09-12 NOTE — Telephone Encounter (Signed)
pt notified about lab results and said she doe snot have a PCP right now since [facility] closed and said she was told b [facility] Out pt would be about 10/2012 before appt. I advisede tcb [facility] Out pt and tell them that her glucose is too high and needs to be seen, pt ok

## 2012-09-12 NOTE — Telephone Encounter (Signed)
Message copied by Michae Kava on Wed Sep 12, 2012 12:01 PM ------      Message from: [place], [place] T      Created: Tue Sep 11, 2012  4:26 PM       Potassium and kidney function look good.      Continue with current treatment plan.      Needs close follow up with PCP for diabetes.      Richardson Dopp, PA-C  4:49 PM 08/16/2012

## 2012-09-18 ENCOUNTER — Other Ambulatory Visit: Payer: Self-pay | Admitting: Physician Assistant

## 2012-09-18 DIAGNOSIS — I10 Essential (primary) hypertension: Secondary | ICD-10-CM

## 2012-09-18 DIAGNOSIS — E785 Hyperlipidemia, unspecified: Secondary | ICD-10-CM

## 2012-09-18 DIAGNOSIS — I5023 Acute on chronic systolic (congestive) heart failure: Secondary | ICD-10-CM

## 2012-09-19 ENCOUNTER — Other Ambulatory Visit (INDEPENDENT_AMBULATORY_CARE_PROVIDER_SITE_OTHER): Payer: Medicare Other

## 2012-09-19 DIAGNOSIS — E785 Hyperlipidemia, unspecified: Secondary | ICD-10-CM

## 2012-09-19 DIAGNOSIS — I509 Heart failure, unspecified: Secondary | ICD-10-CM

## 2012-09-19 DIAGNOSIS — I10 Essential (primary) hypertension: Secondary | ICD-10-CM

## 2012-09-19 DIAGNOSIS — I5023 Acute on chronic systolic (congestive) heart failure: Secondary | ICD-10-CM

## 2012-09-19 LAB — BASIC METABOLIC PANEL WITH GFR
BUN: 19 mg/dL (ref 6–23)
CO2: 32 meq/L (ref 19–32)
Calcium: 8.8 mg/dL (ref 8.4–10.5)
Chloride: 101 meq/L (ref 96–112)
Creatinine, Ser: 1 mg/dL (ref 0.4–1.2)
GFR: 75.6 mL/min
Glucose, Bld: 218 mg/dL — ABNORMAL HIGH (ref 70–99)
Potassium: 3.9 meq/L (ref 3.5–5.1)
Sodium: 139 meq/L (ref 135–145)

## 2012-09-19 LAB — HEPATIC FUNCTION PANEL
ALT: 17 U/L (ref 0–35)
AST: 18 U/L (ref 0–37)
Albumin: 3.7 g/dL (ref 3.5–5.2)
Alkaline Phosphatase: 166 U/L — ABNORMAL HIGH (ref 39–117)
Bilirubin, Direct: 0.1 mg/dL (ref 0.0–0.3)
Total Bilirubin: 0.8 mg/dL (ref 0.3–1.2)
Total Protein: 7.3 g/dL (ref 6.0–8.3)

## 2012-10-26 ENCOUNTER — Encounter: Payer: Self-pay | Admitting: Internal Medicine

## 2012-10-26 ENCOUNTER — Ambulatory Visit (INDEPENDENT_AMBULATORY_CARE_PROVIDER_SITE_OTHER): Payer: Medicare Other | Admitting: Cardiology

## 2012-10-26 DIAGNOSIS — I428 Other cardiomyopathies: Secondary | ICD-10-CM

## 2012-10-26 DIAGNOSIS — Z95810 Presence of automatic (implantable) cardiac defibrillator: Secondary | ICD-10-CM

## 2012-10-26 LAB — ICD DEVICE OBSERVATION
BATTERY VOLTAGE: 2.52 V
BRDY-0002RV: 40 {beats}/min
DEVICE MODEL ICD: 436280
FVT: 0
HV IMPEDENCE: 40 Ohm
PACEART VT: 0
RV LEAD AMPLITUDE: 12 mv
RV LEAD IMPEDENCE ICD: 460 Ohm
RV LEAD THRESHOLD: 0.75 V
TZAT-0001SLOWVT: 1
TZAT-0004SLOWVT: 8
TZAT-0012SLOWVT: 200 ms
TZAT-0013SLOWVT: 1
TZAT-0018SLOWVT: NEGATIVE
TZAT-0019SLOWVT: 7.5 V
TZAT-0020SLOWVT: 1 ms
TZON-0003SLOWVT: 300 ms
TZON-0004SLOWVT: 16
TZON-0005SLOWVT: 6
TZST-0001SLOWVT: 2
TZST-0001SLOWVT: 3
TZST-0001SLOWVT: 4
TZST-0001SLOWVT: 5
TZST-0003SLOWVT: 25 J
TZST-0003SLOWVT: 36 J
TZST-0003SLOWVT: 36 J
TZST-0003SLOWVT: 36 J
VENTRICULAR PACING ICD: 0 pct
VF: 0

## 2012-10-26 NOTE — Progress Notes (Signed)
ICD check/device clinic only. See PaceArt report.

## 2012-10-26 NOTE — Patient Instructions (Addendum)
Please call us if you feel your device "vibrate"   Your physician recommends that you schedule a follow-up appointment in: 2 months for device check - 3/24 at 10:00

## 2012-11-13 ENCOUNTER — Ambulatory Visit (INDEPENDENT_AMBULATORY_CARE_PROVIDER_SITE_OTHER): Payer: Medicare Other | Admitting: Cardiology

## 2012-11-13 ENCOUNTER — Encounter: Payer: Self-pay | Admitting: Cardiology

## 2012-11-13 VITALS — BP 164/90 | HR 98 | Wt 235.0 lb

## 2012-11-13 DIAGNOSIS — I10 Essential (primary) hypertension: Secondary | ICD-10-CM

## 2012-11-13 DIAGNOSIS — Z95810 Presence of automatic (implantable) cardiac defibrillator: Secondary | ICD-10-CM

## 2012-11-13 DIAGNOSIS — I428 Other cardiomyopathies: Secondary | ICD-10-CM

## 2012-11-13 LAB — BASIC METABOLIC PANEL
BUN: 11 mg/dL (ref 6–23)
CO2: 29 mEq/L (ref 19–32)
Calcium: 9.2 mg/dL (ref 8.4–10.5)
Chloride: 102 mEq/L (ref 96–112)
Creatinine, Ser: 0.8 mg/dL (ref 0.4–1.2)
GFR: 101.67 mL/min (ref >60.00)
Glucose, Bld: 228 mg/dL — ABNORMAL HIGH (ref 70–99)
Potassium: 3.9 mEq/L (ref 3.5–5.1)
Sodium: 140 mEq/L (ref 135–145)

## 2012-11-13 MED ORDER — AMLODIPINE BESYLATE 5 MG PO TABS: 5.0000 mg | ORAL_TABLET | Freq: Every day | ORAL | Status: DC

## 2012-11-13 NOTE — Patient Instructions (Addendum)
Your physician recommends that you schedule a follow-up AS SCHEDULED  START AMLODIPINE 5 MG ONCE DAILY

## 2012-11-13 NOTE — Assessment & Plan Note (Signed)
Continue statin. 

## 2012-11-13 NOTE — Assessment & Plan Note (Signed)
Patient counseled on discontinuing. 

## 2012-11-13 NOTE — Assessment & Plan Note (Signed)
Management per electrophysiology. 

## 2012-11-13 NOTE — Assessment & Plan Note (Signed)
Continue ACE inhibitor and beta blocker. 

## 2012-11-13 NOTE — Assessment & Plan Note (Signed)
Patient euvolemic on examination. Continue present dose of diuretic. Check potassium and renal function.

## 2012-11-13 NOTE — Progress Notes (Signed)
HPI: Pleasant female for fu of chronic combined systolic and diastolic CHF.  She has a hx of NICM with a pror EF of 123456, chronic systolic CHF, HTN, HL, DM2, s/p AICD implantation. Myoview 2008: no ischemia, EF 33%. LHC 6/13: Mild calcification in the LM, o/w normal coronary arteries, EF 45%. Echo 7/13: EF 45-50%, Gr 2 diast dysfn, mild AI, mild MAC, trivial MR, mild LAE, PASP 47. Patient last seen 12/13 with CHF symptoms. Since that time, her dyspnea has improved. She has mild dyspnea on exertion but no PND and no pedal edema. No exertional chest pain.   Current Outpatient Prescriptions  Medication Sig Dispense Refill  . albuterol (VENTOLIN HFA) 108 (90 BASE) MCG/ACT inhaler Inhale 2 puffs into the lungs every 6 (six) hours as needed. Shortness of breath      . aspirin 81 MG tablet Take 81 mg by mouth daily.        [name] atorvastatin (LIPITOR) 40 MG tablet Take 40 mg by mouth at bedtime.      . citalopram (CELEXA) 20 MG tablet Take 20 mg by mouth daily.      . Fluticasone-Salmeterol (ADVAIR) 250-50 MCG/DOSE AEPB Inhale 1 puff into the lungs every 12 (twelve) hours.      . furosemide (LASIX) 40 MG tablet Take 1.5 tablets (60 mg total) by mouth daily.  30 tablet    . gabapentin (NEURONTIN) 300 MG capsule Take 300 mg by mouth at bedtime.       . insulin aspart (NOVOLOG) 100 UNIT/ML injection Inject 3-20 Units into the skin 3 (three) times daily before meals. Sliding scale      . insulin glargine (LANTUS) 100 UNIT/ML injection Inject 44 Units into the skin 2 (two) times daily.      [name] ketoconazole (NIZORAL) 2 % shampoo Apply 1 application topically 3 (three) times a week.       [name] lisinopril (PRINIVIL,ZESTRIL) 40 MG tablet Take 40 mg by mouth daily.      . metoprolol (TOPROL-XL) 200 MG 24 hr tablet Take 200 mg by mouth daily.      . naproxen (NAPROSYN) 500 MG tablet Take 500 mg by mouth 2 (two) times daily with a meal.      . nitroGLYCERIN (NITROSTAT) 0.4 MG SL tablet Place 1 tablet (0.4 mg total)  under the tongue every 5 (five) minutes as needed for chest pain.  25 tablet  3  . spironolactone (ALDACTONE) 25 MG tablet Take 25 mg by mouth daily.        [name] triamcinolone (KENALOG) 0.1 % ointment Apply 1 application topically as needed. rash       No current facility-administered medications for this visit.     Past Medical History  Diagnosis Date  . Hyperthyroidism, subclinical   . Post menopausal syndrome   . Sleep apnea   . Obesity   . Cardiac defibrillator in situ     Atlas II VR (SJM) implanted by Dr Lovena Le  . Eczema   . Depression   . Lipoma   . Chronic ulcer of leg   . Hyperlipidemia   . Chronic combined systolic and diastolic heart failure     a. EF 35-40% in past;  b. Echo 7/13:  EF 45-50%, Gr 2 diast dysfn, mild AI, mild MAC, trivial MR, mild LAE, PASP 47.  [name] NICM (nonischemic cardiomyopathy)   . Diabetes mellitus   . HTN (hypertension)   . COPD (chronic obstructive pulmonary disease)   . Elevated alkaline  phosphatase level     GGT and 5'nucleotidase 8/13 normal  . Hx of cardiac cath     a. [place] 2003 normal;  b. LHC 6/13:  Mild calcification in the LM, o/w normal coronary arteries, EF 45%.     Past Surgical History  Procedure Laterality Date  . Tubal ligation    . Cardiac defibrillator placement  05/04/2007    SJM Atlas II VR ICD  . Hysteroscopy    . Cardiac defibrillator placement    . Abdominal hysterectomy    . Hernia repair    . Insert / replace / remove pacemaker      History   Social History  . Marital Status: Married    Spouse Name: Alroy Dust    Number of Children: N/A  . Years of Education: N/A   Occupational History  . disabled    Social History Main Topics  . Smoking status: Current Every Day Smoker -- 0.25 packs/day for 35 years    Types: Cigarettes  . Smokeless tobacco: Not on file     Comment: currently smoking 3 cigs daily.   . Alcohol Use: 1.0 oz/week     Comment: Quit 05/2007  . Drug Use: No  . Sexually Active: Not on file    Other Topics Concern  . Not on file   Social History Narrative   ** Merged History Encounter **       Married    ROS: no fevers or chills, productive cough, hemoptysis, dysphasia, odynophagia, melena, hematochezia, dysuria, hematuria, rash, seizure activity, orthopnea, PND, pedal edema, claudication. Remaining systems are negative.  Physical Exam: Well-developed obese in no acute distress.  Skin is warm and dry.  HEENT is normal.  Neck is supple.  Chest is clear to auscultation with normal expansion.  Cardiovascular exam is regular rate and rhythm.  Abdominal exam nontender or distended. No masses palpated. Extremities show no edema. neuro grossly intact

## 2012-11-13 NOTE — Assessment & Plan Note (Signed)
Blood pressure elevated. Add Norvasc 5 mg daily. Patient instructed to follow her blood pressure at home and we will increase medications as needed.

## 2012-12-25 ENCOUNTER — Ambulatory Visit (INDEPENDENT_AMBULATORY_CARE_PROVIDER_SITE_OTHER): Payer: Medicare Other | Admitting: Cardiology

## 2012-12-25 DIAGNOSIS — Z95810 Presence of automatic (implantable) cardiac defibrillator: Secondary | ICD-10-CM

## 2012-12-25 DIAGNOSIS — I428 Other cardiomyopathies: Secondary | ICD-10-CM

## 2012-12-25 DIAGNOSIS — R0989 Other specified symptoms and signs involving the circulatory and respiratory systems: Secondary | ICD-10-CM

## 2012-12-25 DIAGNOSIS — Z4502 Encounter for adjustment and management of automatic implantable cardiac defibrillator: Secondary | ICD-10-CM

## 2012-12-25 LAB — ICD DEVICE OBSERVATION
DEVICE MODEL ICD: 436280
TZAT-0001SLOWVT: 1
TZAT-0004SLOWVT: 8
TZAT-0012SLOWVT: 200 ms
TZAT-0013SLOWVT: 1
TZAT-0018SLOWVT: NEGATIVE
TZAT-0019SLOWVT: 7.5 V
TZAT-0020SLOWVT: 1 ms
TZON-0003SLOWVT: 300 ms
TZON-0004SLOWVT: 16
TZON-0005SLOWVT: 6
TZST-0001SLOWVT: 2
TZST-0001SLOWVT: 3
TZST-0001SLOWVT: 4
TZST-0001SLOWVT: 5
TZST-0003SLOWVT: 25 J
TZST-0003SLOWVT: 36 J
TZST-0003SLOWVT: 36 J
TZST-0003SLOWVT: 36 J

## 2012-12-25 NOTE — Patient Instructions (Signed)
Your physician wants you to follow-up in: 6 months with Dr. Knox Saliva will receive a reminder letter in the mail two months in advance. If you don't receive a letter, please call our office to schedule the follow-up appointment.  Keep your current appointment with Dr. Stanford Breed on 01/22/2013. Your Pacer Battery will be checked on that day also.

## 2012-12-25 NOTE — Progress Notes (Signed)
ICD battery check only. Battery voltage 2.53. See PaceArt report. Recheck battery in 1 month.

## 2013-01-21 ENCOUNTER — Encounter: Payer: Self-pay | Admitting: Internal Medicine

## 2013-01-22 ENCOUNTER — Ambulatory Visit (INDEPENDENT_AMBULATORY_CARE_PROVIDER_SITE_OTHER): Payer: PRIVATE HEALTH INSURANCE | Admitting: Internal Medicine

## 2013-01-22 ENCOUNTER — Ambulatory Visit (INDEPENDENT_AMBULATORY_CARE_PROVIDER_SITE_OTHER): Payer: PRIVATE HEALTH INSURANCE | Admitting: Cardiology

## 2013-01-22 ENCOUNTER — Encounter: Payer: Self-pay | Admitting: Internal Medicine

## 2013-01-22 ENCOUNTER — Encounter: Payer: Self-pay | Admitting: Cardiology

## 2013-01-22 ENCOUNTER — Ambulatory Visit (INDEPENDENT_AMBULATORY_CARE_PROVIDER_SITE_OTHER): Payer: PRIVATE HEALTH INSURANCE | Admitting: Dietician

## 2013-01-22 ENCOUNTER — Ambulatory Visit (INDEPENDENT_AMBULATORY_CARE_PROVIDER_SITE_OTHER): Payer: PRIVATE HEALTH INSURANCE | Admitting: *Deleted

## 2013-01-22 VITALS — BP 153/93 | HR 80 | Temp 97.2°F | Ht 60.0 in | Wt 238.6 lb

## 2013-01-22 VITALS — BP 145/84 | HR 77 | Ht 60.0 in | Wt 238.0 lb

## 2013-01-22 DIAGNOSIS — Z95810 Presence of automatic (implantable) cardiac defibrillator: Secondary | ICD-10-CM

## 2013-01-22 DIAGNOSIS — E785 Hyperlipidemia, unspecified: Secondary | ICD-10-CM

## 2013-01-22 DIAGNOSIS — E669 Obesity, unspecified: Secondary | ICD-10-CM

## 2013-01-22 DIAGNOSIS — E119 Type 2 diabetes mellitus without complications: Secondary | ICD-10-CM

## 2013-01-22 DIAGNOSIS — F17200 Nicotine dependence, unspecified, uncomplicated: Secondary | ICD-10-CM

## 2013-01-22 DIAGNOSIS — I10 Essential (primary) hypertension: Secondary | ICD-10-CM

## 2013-01-22 DIAGNOSIS — Z0000 Encounter for general adult medical examination without abnormal findings: Secondary | ICD-10-CM

## 2013-01-22 DIAGNOSIS — I5042 Chronic combined systolic (congestive) and diastolic (congestive) heart failure: Secondary | ICD-10-CM

## 2013-01-22 DIAGNOSIS — I428 Other cardiomyopathies: Secondary | ICD-10-CM

## 2013-01-22 HISTORY — DX: Encounter for general adult medical examination without abnormal findings: Z00.00

## 2013-01-22 LAB — LIPID PANEL
Cholesterol: 201 mg/dL — ABNORMAL HIGH (ref 0–200)
HDL: 67 mg/dL
LDL Cholesterol: 111 mg/dL — ABNORMAL HIGH (ref 0–99)
Total CHOL/HDL Ratio: 3 ratio
Triglycerides: 113 mg/dL
VLDL: 23 mg/dL (ref 0–40)

## 2013-01-22 LAB — BASIC METABOLIC PANEL WITH GFR
BUN: 17 mg/dL (ref 6–23)
CO2: 31 mEq/L (ref 19–32)
Calcium: 9.2 mg/dL (ref 8.4–10.5)
Chloride: 97 mEq/L (ref 96–112)
Creat: 0.88 mg/dL (ref 0.50–1.10)
GFR, Est African American: 83 mL/min
GFR, Est Non African American: 72 mL/min
Glucose, Bld: 283 mg/dL — ABNORMAL HIGH (ref 70–99)
Potassium: 3.7 mEq/L (ref 3.5–5.3)
Sodium: 144 mEq/L (ref 135–145)

## 2013-01-22 LAB — POCT GLYCOSYLATED HEMOGLOBIN (HGB A1C): Hemoglobin A1C: 10.4

## 2013-01-22 LAB — GLUCOSE, CAPILLARY: Glucose-Capillary: 274 mg/dL — ABNORMAL HIGH (ref 70–99)

## 2013-01-22 NOTE — Progress Notes (Signed)
Diabetes Self-Management Education  Visit Number: First/Initial  01/22/2013 [name], identified by name and date of birth, is a 62 y.o. female with Diabetes Type: Type 2.  Other people present during visit:    no  ASSESSMENT  Patient Concerns:  Healthy Lifestyle;Glycemic Control   BMI.46.6  Lab Results: LDL Cholesterol  Date Value Range Status  11/08/2010 93  0-99 mg/dL Final     See lab report for associated comment(s)     Hemoglobin A1C  Date Value Range Status  01/22/2013 10.4   Final  04/02/2012 9.2* <5.7 % Final     (NOTE)                                                                               Family History  Problem Relation Age of Onset  . Stroke Mother   . Seizures Father   . Diabetes Sister   . Asthma Maternal Aunt     aunts  . Asthma Maternal Uncle     uncles  . Heart disease Father   . Heart disease Paternal Aunt     aunts  . Heart disease Paternal Uncle     uncles  . Heart disease Maternal Aunt     aunts  . Heart disease Maternal Uncle     uncles  . Heart disease Maternal Grandfather    History  Substance Use Topics  . Smoking status: Current Every Day Smoker -- 0.25 packs/day for 35 years    Types: Cigarettes  . Smokeless tobacco: Not on file     Comment: currently smoking 3 cigs daily.   . Alcohol Use: 1.0 oz/week     Comment: Quit 05/2007    Support Systems:  Spouse;Other (comment) Special Needs:    no Prior DM Education:   yes- when diagnosed in 63s at [facility] Daily Foot Exams: Yes Patient Belief / Attitude about Diabetes:   it can be controlled  Assessment comments: patients estimated weight based TDD: 54-130 units /day, current TTD estimated by her report of "SSI" is ~ 88- 200 units a day ( says she takes 4-20 units Novolog with ~ 15 meals/week)  . Recommend patient's insulin be adjusted to improve blood sugars and to a simpler regimen.  Consider 60 units basal one time a day and 15- 20 units prandial with each meal to start then  increase based on glycemic response.    Diet Recall: sweets, chips, [place] sandwich, yogurt, diet soda, fruited gelatin    Individualized Plan for Diabetes Self-Management Training:  Patient individualized diabetes plan discussed today with patient and includes: Nutrition, monitoring, physical activity, how to handle highs and lows  Education Topics Reviewed with Patient Today:  Topic Points Discussed  Disease State    Nutrition Management Role of diet in the treatment of diabetes and the relationship between the three main macronutrients and blood glucose level  Physical Activity and Exercise Role of exercise on diabetes management, blood pressure control and cardiac health.  Medications    Monitoring    Acute Complications    Chronic Complications    Psychosocial Adjustment    Goal Setting    Preconception Care (if applicable)  PATIENTS GOALS   Learning Objective(s):     Goal The patient agrees to:  Nutrition Follow meal plan discussed  Physical Activity Exercise 3-5 times per week;45 minutes per day  Medications    Monitoring    Problem Solving    Reducing Risk    Health Coping     Patient Self-Evaluation of Goals (Subsequent Visits)  Goal The patient rates self as meeting goals (% of time)  Nutrition    Physical Activity    Medications    Monitoring    Problem Solving    Reducing Risk    Health Coping       PERSONALIZED PLAN / SUPPORT  Self-Management Support:  Doctor's office;Church;Friends;Family;CDE visits   ______________________________________________________________________  Outcomes  Expected Outcomes:  Demonstrated interest in learning. Expect positive outcomes Self-Care Barriers:  Debilitated state due to current medical condition;Lack of material resources Education material provided: yes If problems or questions, patient to contact team via:  Phone Time in: 1030     Time out: 1100  Future DSME appointment: - 2 wks   [name],  [name] 01/22/2013 11:11 AM

## 2013-01-22 NOTE — Assessment & Plan Note (Addendum)
BMI 46. Likely contributing to her knee pain. Discussed diet and exercise, referred to nutrition/diabetes educator.

## 2013-01-22 NOTE — Assessment & Plan Note (Signed)
Blood pressure mildly elevated. However she has not taken her medications this morning. She will continue to monitor and we will increase as needed.

## 2013-01-22 NOTE — Progress Notes (Signed)
Case discussed with Dr. Eulas Post at the time of the visit, immediately after the resident saw the patient.  I reviewed the resident's history and exam and pertinent patient test results.  I agree with the assessment, diagnosis and plan of care documented in the resident's note.

## 2013-01-22 NOTE — Assessment & Plan Note (Signed)
Patient euvolemic on examination. Continue present dose of diuretic.

## 2013-01-22 NOTE — Assessment & Plan Note (Signed)
Patient counseled on discontinuing. 

## 2013-01-22 NOTE — Assessment & Plan Note (Signed)
BP Readings from Last 3 Encounters:  01/22/13 145/84  01/22/13 153/93  11/13/12 164/90    Lab Results  Component Value Date   NA 140 11/13/2012   K 3.9 11/13/2012   CREATININE 0.8 11/13/2012    Assessment: Blood pressure control: moderately elevated Progress toward BP goal:  unchanged Comments: She did not take her medication today  Plan: Medications:  continue current medications Educational resources provided: brochure;handout;video Self management tools provided:   Other plans: F/u next week

## 2013-01-22 NOTE — Assessment & Plan Note (Signed)
Checking lipid panel today. 

## 2013-01-22 NOTE — Assessment & Plan Note (Signed)
Continue ACE inhibitor and beta blocker. 

## 2013-01-22 NOTE — Progress Notes (Signed)
defib check in clinic. Battery voltage 2.52 V. Monthly checks per pt. Vibratory alert demonstrated changed to total of 16 notifications.

## 2013-01-22 NOTE — Patient Instructions (Addendum)
Your physician wants you to follow-up in: 6 MONTHS WITH DR CRENSHAW You will receive a reminder letter in the mail two months in advance. If you don't receive a letter, please call our office to schedule the follow-up appointment.  

## 2013-01-22 NOTE — Patient Instructions (Signed)
Please make a follow up for 1-2 weeks and bring your meter so i can help you learn how to use it.   You said you want to start bringing your lunch with you instead of eating out or unhealthy choices and walking for more time when you walk

## 2013-01-22 NOTE — Assessment & Plan Note (Signed)
Continue statin. 

## 2013-01-22 NOTE — Assessment & Plan Note (Addendum)
On lantus 44u BID, not sure why she has BID dosing. She also has SSI which she is not always using due to social obligations. Her A1c is 10.4 today with CBG 274. She states that she did not take her insulin today. Will continue her current regimen for now, and she is to check her CBGs TID before meals and keep a log. She is to f/u in clinic in 1 week. Will refer to [facility], out diabetes educator as well. Referred for diabetic eye exam with Optho.  Lab Results  Component Value Date   HGBA1C 10.4 01/22/2013   HGBA1C 9.2* 04/02/2012   HGBA1C 9.9* 03/22/2011     Assessment: Diabetes control: poor control (HgbA1C >9%) Progress toward A1C goal:  deteriorated   Plan: Medications:  continue current medications Home glucose monitoring: Frequency: 2 times a day Timing: before breakfast;before dinner Instruction/counseling given: reminded to get eye exam, reminded to bring blood glucose meter & log to each visit, discussed the need for weight loss, discussed diet and provided printed educational material Educational resources provided: brochure;handout Self management tools provided: home glucose logbook;instructions for home glucose monitoring Other plans: f/u in 1 week

## 2013-01-22 NOTE — Progress Notes (Signed)
HPI: Pleasant female for fu of chronic combined systolic and diastolic CHF. She has a hx of NICM with a pror EF of 35-40%, HTN, HL, DM2, s/p AICD implantation. Myoview 2008: no ischemia, EF 33%. LHC 6/13: Mild calcification in the LM, o/w normal coronary arteries, EF 45%. Echo 7/13: EF 45-50%, Gr 2 diast dysfn, mild AI, mild MAC, trivial MR, mild LAE, PASP 47. Patient last seen 2/14. Since that time, the patient has dyspnea with more extreme activities but not with routine activities. It is relieved with rest. It is not associated with chest pain. There is no orthopnea, PND or pedal edema. There is no syncope or palpitations. There is no exertional chest pain.    Current Outpatient Prescriptions  Medication Sig Dispense Refill  . albuterol (VENTOLIN HFA) 108 (90 BASE) MCG/ACT inhaler Inhale 2 puffs into the lungs every 6 (six) hours as needed. Shortness of breath      . amLODipine (NORVASC) 5 MG tablet Take 1 tablet (5 mg total) by mouth daily.  30 tablet  11  . aspirin 81 MG tablet Take 81 mg by mouth daily.        [name] atorvastatin (LIPITOR) 40 MG tablet Take 40 mg by mouth at bedtime.      . citalopram (CELEXA) 20 MG tablet Take 20 mg by mouth daily.      . Fluticasone-Salmeterol (ADVAIR) 250-50 MCG/DOSE AEPB Inhale 1 puff into the lungs every 12 (twelve) hours.      . furosemide (LASIX) 40 MG tablet Take 1.5 tablets (60 mg total) by mouth daily.  30 tablet    . gabapentin (NEURONTIN) 300 MG capsule Take 300 mg by mouth at bedtime.       . insulin aspart (NOVOLOG) 100 UNIT/ML injection Inject 3-20 Units into the skin 3 (three) times daily before meals. Sliding scale      . insulin glargine (LANTUS) 100 UNIT/ML injection Inject 44 Units into the skin 2 (two) times daily.      [name] ketoconazole (NIZORAL) 2 % shampoo Apply 1 application topically 3 (three) times a week.       [name] lisinopril (PRINIVIL,ZESTRIL) 40 MG tablet Take 40 mg by mouth daily.      . metoprolol (TOPROL-XL) 200 MG 24 hr tablet Take  200 mg by mouth daily.      . naproxen (NAPROSYN) 500 MG tablet Take 500 mg by mouth 2 (two) times daily with a meal.      . nitroGLYCERIN (NITROSTAT) 0.4 MG SL tablet Place 1 tablet (0.4 mg total) under the tongue every 5 (five) minutes as needed for chest pain.  25 tablet  3  . spironolactone (ALDACTONE) 25 MG tablet Take 25 mg by mouth daily.        [name] triamcinolone (KENALOG) 0.1 % ointment Apply 1 application topically as needed. rash       No current facility-administered medications for this visit.     Past Medical History  Diagnosis Date  . Hyperthyroidism, subclinical   . Post menopausal syndrome   . Sleep apnea   . Obesity   . Cardiac defibrillator in situ     Atlas II VR (SJM) implanted by Dr Lovena Le  . Eczema   . Depression   . Lipoma   . Chronic ulcer of leg   . Hyperlipidemia   . Chronic combined systolic and diastolic heart failure     a. EF 35-40% in past;  b. Echo 7/13:  EF 45-50%, Gr 2 diast  dysfn, mild AI, mild MAC, trivial MR, mild LAE, PASP 47.  [name] NICM (nonischemic cardiomyopathy)   . Diabetes mellitus   . HTN (hypertension)   . COPD (chronic obstructive pulmonary disease)   . Elevated alkaline phosphatase level     GGT and 5'nucleotidase 8/13 normal  . Hx of cardiac cath     a. [place] 2003 normal;  b. LHC 6/13:  Mild calcification in the LM, o/w normal coronary arteries, EF 45%.     Past Surgical History  Procedure Laterality Date  . Tubal ligation    . Cardiac defibrillator placement  05/04/2007    SJM Atlas II VR ICD  . Hysteroscopy    . Cardiac defibrillator placement    . Abdominal hysterectomy    . Hernia repair    . Insert / replace / remove pacemaker      History   Social History  . Marital Status: Married    Spouse Name: Alroy Dust    Number of Children: N/A  . Years of Education: N/A   Occupational History  . disabled    Social History Main Topics  . Smoking status: Light Tobacco Smoker -- 0.25 packs/day for 35 years    Types:  Cigarettes  . Smokeless tobacco: Not on file     Comment: currently smoking 3 cigs daily.   . Alcohol Use: 1.0 oz/week     Comment: Quit 05/2007  . Drug Use: No  . Sexually Active: Not on file   Other Topics Concern  . Not on file   Social History Narrative   ** Merged History Encounter **       Married    ROS: no fevers or chills, productive cough, hemoptysis, dysphasia, odynophagia, melena, hematochezia, dysuria, hematuria, rash, seizure activity, orthopnea, PND, pedal edema, claudication. Remaining systems are negative.  Physical Exam: Well-developed well-nourished in no acute distress.  Skin is warm and dry.  HEENT is normal.  Neck is supple.  Chest is clear to auscultation with normal expansion.  Cardiovascular exam is regular rate and rhythm.  Abdominal exam nontender or distended. No masses palpated. Extremities show trace edema. neuro grossly intact  ECG sinus rhythm at a rate of 75. Lateral T-wave inversion. Prolonged QT interval.

## 2013-01-22 NOTE — Assessment & Plan Note (Addendum)
  Assessment: Progress toward smoking cessation:  smoking the same amount Barriers to progress toward smoking cessation:  lack of motivation to quit  Plan: Instruction/counseling given:  I advised patient to stop smoking. Educational resources provided:  [employer] account manager) brochure Self management tools provided:    Medications to assist with smoking cessation:  None Patient agreed to the following self-care plans for smoking cessation:    Other plans:  Patient counseled on discontinuing. Currently uninterested. Discuss again at next visit

## 2013-01-22 NOTE — Progress Notes (Signed)
Patient ID: [name], female   DOB: Dec 10, 1950, 62 y.o.   MRN: [id number]  Subjective:   Patient ID: [name] female   DOB: 1951-03-20 63 y.o.   MRN: [id number]  HPI: [name] is a 62 y.o. female former [facility] patient with past medical history of diabetes mellitus type 2, hyperlipidemia, hypertension, and combined systolic and diastolic heart failure presents to [facility] as new patient.   Her last echo was 7-13 showed normal left ventricular size with mild global hypokinesis EF of 45-50%. Moderate diastolic dysfunction. Mildly reduced systolic function, and mild pulmonary hypertension. She does have an ICD in place which she says is to be interrogated today. She is followed by [facility] and sees Dr. Stanford Breed. She was last seen in February 2014 and her her blood pressure was noted to be elevated. She was started on Norvasc 5 mg in addition to her ACE inhibitor, beta blocker, spironolactone, and Lasix. She states she does have home oxygen that she uses at night and as needed and does normally have mild dyspnea on exertion, but currently she is doing well.  For her diabetes she's currently on Lantus 44 units twice a day and sliding scale insulin with meals. She states that she used usually uses 8-10 units as coverage with meals, but sometimes she does forget. She states that she checks her blood sugar about 2 times a day, and her sugars very. She does note that she went to bed the other night without eating and the next morning her blood sugar was 49.  She c/o of intermittent pain in her knees which has been occuring for the past month +, that is worse with increased edema of her BLE.   She does endorse eczema on the palmar surface of her hands which has been present since she was seen at [facility]. She's given triamcinolone cream for the which are currently does help somewhat however she still complains of itching. She states she does get patches on her elbows at times.  Of  note she states she did not take any of her medications prior to her clinic visit today.   Past Medical History  Diagnosis Date  . Hyperthyroidism, subclinical   . Post menopausal syndrome   . Sleep apnea   . Obesity   . Cardiac defibrillator in situ     Atlas II VR (SJM) implanted by Dr Lovena Le  . Eczema   . Depression   . Lipoma   . Chronic ulcer of leg   . Hyperlipidemia   . Chronic combined systolic and diastolic heart failure     a. EF 35-40% in past;  b. Echo 7/13:  EF 45-50%, Gr 2 diast dysfn, mild AI, mild MAC, trivial MR, mild LAE, PASP 47.  [name] NICM (nonischemic cardiomyopathy)   . Diabetes mellitus   . HTN (hypertension)   . COPD (chronic obstructive pulmonary disease)   . Elevated alkaline phosphatase level     GGT and 5'nucleotidase 8/13 normal  . Hx of cardiac cath     a. [place] 2003 normal;  b. LHC 6/13:  Mild calcification in the LM, o/w normal coronary arteries, EF 45%.    Current Outpatient Prescriptions  Medication Sig Dispense Refill  . albuterol (VENTOLIN HFA) 108 (90 BASE) MCG/ACT inhaler Inhale 2 puffs into the lungs every 6 (six) hours as needed. Shortness of breath      . amLODipine (NORVASC) 5 MG tablet Take 1 tablet (5 mg total) by  mouth daily.  30 tablet  11  . aspirin 81 MG tablet Take 81 mg by mouth daily.        [name] atorvastatin (LIPITOR) 40 MG tablet Take 40 mg by mouth at bedtime.      . citalopram (CELEXA) 20 MG tablet Take 20 mg by mouth daily.      . Fluticasone-Salmeterol (ADVAIR) 250-50 MCG/DOSE AEPB Inhale 1 puff into the lungs every 12 (twelve) hours.      . furosemide (LASIX) 40 MG tablet Take 1.5 tablets (60 mg total) by mouth daily.  30 tablet    . gabapentin (NEURONTIN) 300 MG capsule Take 300 mg by mouth at bedtime.       . insulin aspart (NOVOLOG) 100 UNIT/ML injection Inject 3-20 Units into the skin 3 (three) times daily before meals. Sliding scale      . insulin glargine (LANTUS) 100 UNIT/ML injection Inject 44 Units into the skin 2 (two)  times daily.      [name] ketoconazole (NIZORAL) 2 % shampoo Apply 1 application topically 3 (three) times a week.       [name] lisinopril (PRINIVIL,ZESTRIL) 40 MG tablet Take 40 mg by mouth daily.      . metoprolol (TOPROL-XL) 200 MG 24 hr tablet Take 200 mg by mouth daily.      [name] spironolactone (ALDACTONE) 25 MG tablet Take 25 mg by mouth daily.        [name] triamcinolone (KENALOG) 0.1 % ointment Apply 1 application topically as needed. rash      . naproxen (NAPROSYN) 500 MG tablet Take 500 mg by mouth 2 (two) times daily with a meal.      . nitroGLYCERIN (NITROSTAT) 0.4 MG SL tablet Place 1 tablet (0.4 mg total) under the tongue every 5 (five) minutes as needed for chest pain.  25 tablet  3   No current facility-administered medications for this visit.   Family History  Problem Relation Age of Onset  . Stroke Mother   . Seizures Father   . Diabetes Sister   . Asthma Maternal Aunt     aunts  . Asthma Maternal Uncle     uncles  . Heart disease Father   . Heart disease Paternal Aunt     aunts  . Heart disease Paternal Uncle     uncles  . Heart disease Maternal Aunt     aunts  . Heart disease Maternal Uncle     uncles  . Heart disease Maternal Grandfather    History   Social History  . Marital Status: Married    Spouse Name: Alroy Dust    Number of Children: N/A  . Years of Education: N/A   Occupational History  . disabled    Social History Main Topics  . Smoking status: Light Tobacco Smoker -- 0.25 packs/day for 35 years    Types: Cigarettes  . Smokeless tobacco: None     Comment: currently smoking 3 cigs daily.   . Alcohol Use: 1.0 oz/week     Comment: Quit 05/2007  . Drug Use: No  . Sexually Active: None   Other Topics Concern  . None   Social History Narrative   ** Merged History Encounter **       Married   Review of Systems: A 10 point ROS was performed; pertinent positives and negatives were noted in the HPI   Objective:  Physical Exam: Filed Vitals:   01/22/13  0903  BP: 153/93  Pulse: 80  Temp: 97.2  F (36.2 C)  TempSrc: Oral  Height: 5' (1.524 m)  Weight: 238 lb 9.6 oz (108.228 kg)  SpO2: 96%   Constitutional: Vital signs reviewed.  Patient is a well-developed and well-nourished female in no acute distress and cooperative with exam.   Head: Normocephalic and atraumatic Ear: TM normal bilaterally Mouth: no erythema or exudates, MMM Eyes: PERRL, EOMI, conjunctivae normal, No scleral icterus.  Neck: Supple, Trachea midline normal ROM, No JVD, mass, thyromegaly, or carotid bruit present.  Cardiovascular: RRR, S1 normal, S2 normal, no MRG, pulses symmetric and intact bilaterally Pulmonary/Chest: CTAB, no wheezes, rales, or rhonchi Abdominal: Soft. Non-tender, non-distended Musculoskeletal: No joint deformities, erythema, or stiffness, ROM full and no nontender. No edema noted. Hematology: No cervical adenopathy.  Neurological: A&O x3, Strength is normal and symmetric bilaterally, cranial nerve II-XII are grossly intact, no focal motor deficit Skin: Warm, dry and intact. No cyanosis, clubbing, or edema. Scaly papular rash on palmar surface of bilateral hands, L>R. Psychiatric: Normal mood and affect.   Assessment & Plan:   Please refer to Problem List based Assessment and Plan

## 2013-01-22 NOTE — Patient Instructions (Addendum)
**  Please take all of your medications as prescribed**  **Your blood sugar is not well controlled. Please check your blood sugar 3 times a day and take your sliding scale insulin 3 times a day prior to meals when needed. This is very important in addition to your Lantus for better blood sugar control. I am not going to change your medications today. Please be sure to eat when you take your insulin so your sugars do not drop too low.  I am referring you to our Diabetes Educator and Nutritionist, [employer] . You will be contacted about an appointment time, but please call the clinic if you do not hear anything in the next week.

## 2013-01-22 NOTE — Assessment & Plan Note (Signed)
Referred for colonoscopy

## 2013-01-22 NOTE — Assessment & Plan Note (Signed)
Management per electrophysiology. 

## 2013-01-30 ENCOUNTER — Encounter: Payer: Self-pay | Admitting: Internal Medicine

## 2013-01-30 ENCOUNTER — Ambulatory Visit (INDEPENDENT_AMBULATORY_CARE_PROVIDER_SITE_OTHER): Payer: Medicare Other | Admitting: Internal Medicine

## 2013-01-30 ENCOUNTER — Ambulatory Visit (INDEPENDENT_AMBULATORY_CARE_PROVIDER_SITE_OTHER): Payer: Medicare Other | Admitting: Dietician

## 2013-01-30 VITALS — BP 148/90 | HR 65 | Temp 97.0°F | Ht 61.0 in | Wt 244.2 lb

## 2013-01-30 VITALS — Ht 60.5 in | Wt 244.3 lb

## 2013-01-30 DIAGNOSIS — F329 Major depressive disorder, single episode, unspecified: Secondary | ICD-10-CM

## 2013-01-30 DIAGNOSIS — I5042 Chronic combined systolic (congestive) and diastolic (congestive) heart failure: Secondary | ICD-10-CM

## 2013-01-30 DIAGNOSIS — E785 Hyperlipidemia, unspecified: Secondary | ICD-10-CM

## 2013-01-30 DIAGNOSIS — M79609 Pain in unspecified limb: Secondary | ICD-10-CM

## 2013-01-30 DIAGNOSIS — F3289 Other specified depressive episodes: Secondary | ICD-10-CM

## 2013-01-30 DIAGNOSIS — E119 Type 2 diabetes mellitus without complications: Secondary | ICD-10-CM

## 2013-01-30 DIAGNOSIS — L259 Unspecified contact dermatitis, unspecified cause: Secondary | ICD-10-CM

## 2013-01-30 DIAGNOSIS — I10 Essential (primary) hypertension: Secondary | ICD-10-CM

## 2013-01-30 DIAGNOSIS — F17200 Nicotine dependence, unspecified, uncomplicated: Secondary | ICD-10-CM

## 2013-01-30 DIAGNOSIS — J4489 Other specified chronic obstructive pulmonary disease: Secondary | ICD-10-CM

## 2013-01-30 DIAGNOSIS — L659 Nonscarring hair loss, unspecified: Secondary | ICD-10-CM

## 2013-01-30 DIAGNOSIS — J449 Chronic obstructive pulmonary disease, unspecified: Secondary | ICD-10-CM

## 2013-01-30 MED ORDER — INSULIN GLARGINE 100 UNIT/ML ~~LOC~~ SOLN: 44.0000 [IU] | Freq: Two times a day (BID) | SUBCUTANEOUS | Status: DC

## 2013-01-30 MED ORDER — ATORVASTATIN CALCIUM 80 MG PO TABS: 80.0000 mg | ORAL_TABLET | Freq: Every day | ORAL | Status: DC

## 2013-01-30 MED ORDER — AMLODIPINE BESYLATE 10 MG PO TABS: 10.0000 mg | ORAL_TABLET | Freq: Every day | ORAL | Status: DC

## 2013-01-30 MED ORDER — NAPROXEN 500 MG PO TABS: 500.0000 mg | ORAL_TABLET | Freq: Two times a day (BID) | ORAL | Status: DC | PRN

## 2013-01-30 MED ORDER — ASPIRIN 81 MG PO TABS: 81.0000 mg | ORAL_TABLET | Freq: Every day | ORAL | Status: DC

## 2013-01-30 MED ORDER — INSULIN ASPART 100 UNIT/ML ~~LOC~~ SOLN: 3.0000 [IU] | Freq: Three times a day (TID) | SUBCUTANEOUS | Status: DC

## 2013-01-30 MED ORDER — FUROSEMIDE 40 MG PO TABS: 60.0000 mg | ORAL_TABLET | Freq: Every day | ORAL | Status: DC

## 2013-01-30 MED ORDER — FLUTICASONE-SALMETEROL 250-50 MCG/DOSE IN AEPB: 1.0000 | INHALATION_SPRAY | Freq: Two times a day (BID) | RESPIRATORY_TRACT | Status: DC

## 2013-01-30 MED ORDER — KETOCONAZOLE 2 % EX SHAM: 1.0000 "application " | MEDICATED_SHAMPOO | CUTANEOUS | Status: DC

## 2013-01-30 MED ORDER — SPIRONOLACTONE 25 MG PO TABS: 25.0000 mg | ORAL_TABLET | Freq: Every day | ORAL | Status: DC

## 2013-01-30 MED ORDER — GABAPENTIN 300 MG PO CAPS: 300.0000 mg | ORAL_CAPSULE | Freq: Every day | ORAL | Status: DC

## 2013-01-30 MED ORDER — ALBUTEROL SULFATE HFA 108 (90 BASE) MCG/ACT IN AERS: 2.0000 | INHALATION_SPRAY | Freq: Four times a day (QID) | RESPIRATORY_TRACT | Status: DC | PRN

## 2013-01-30 MED ORDER — LISINOPRIL 40 MG PO TABS: 40.0000 mg | ORAL_TABLET | Freq: Every day | ORAL | Status: DC

## 2013-01-30 MED ORDER — METOPROLOL SUCCINATE ER 200 MG PO TB24: 200.0000 mg | ORAL_TABLET | Freq: Every day | ORAL | Status: DC

## 2013-01-30 MED ORDER — NITROGLYCERIN 0.4 MG SL SUBL: 0.4000 mg | SUBLINGUAL_TABLET | SUBLINGUAL | Status: DC | PRN

## 2013-01-30 MED ORDER — CITALOPRAM HYDROBROMIDE 20 MG PO TABS: 20.0000 mg | ORAL_TABLET | Freq: Every day | ORAL | Status: DC

## 2013-01-30 NOTE — Progress Notes (Signed)
Diabetes Self-Management Education  Visit Number: Follow-up 1  01/30/2013 [name], identified by name and date of birth, is a 62 y.o. female with Type 2 diabetes  .      ASSESSMENT  Patient Concerns:  Nutrition/Meal planning;Monitoring  Height 5' 0.5" (1.537 m), weight 244 lb 4.8 oz (110.814 kg). Body mass index is 46.91 kg/(m^2).  Lab Results: LDL Cholesterol  Date Value Range Status  01/22/2013 111* 0 - 99 mg/dL Final           Hemoglobin A1C  Date Value Range Status  01/22/2013 10.4   Final  04/02/2012 9.2* <5.7 % Final     (NOTE)                                                                               Family History  Problem Relation Age of Onset  . Stroke Mother   . Seizures Father   . Diabetes Sister   . Asthma Maternal Aunt     aunts  . Asthma Maternal Uncle     uncles  . Heart disease Father   . Heart disease Paternal Aunt     aunts  . Heart disease Paternal Uncle     uncles  . Heart disease Maternal Aunt     aunts  . Heart disease Maternal Uncle     uncles  . Heart disease Maternal Grandfather    History  Substance Use Topics  . Smoking status: Light Tobacco Smoker -- 0.25 packs/day for 35 years    Types: Cigarettes  . Smokeless tobacco: Not on file     Comment: currently smoking 3 cigs daily.   . Alcohol Use: 1.0 oz/week     Comment: Quit 05/2007    Assessment comments: encouraged fruits,vegetables with meals and snacks,     Diet Recall: CBG 60s this am,  ~ 8-9 am Had hot tea, fried chicken thigh, rice and gravy, pound cake for snack ~ 11 am, blood sugar 1:50 Pm was 230. Yesterdays in take: fried pork chop, rice, gracy,tea, skipped lunch , dinner was grits, fried porr chop and tea.   Education Topics Reviewed with Patient Today:  Topic Points Discussed  Disease State    Nutrition Management Meal timing in regards to the patients' current diabetes medication.;Information on hints to eating out and maintain blood glucose control.   Physical Activity and Exercise Helped patient identify appropriate exercises in relation to his/her diabetes, diabetes complications and other health issue.  Medications    Monitoring Taught/evaluated SMBG with Onetouch 2 meter.Taught/discussed recording of test results and interpretation of SMBG.  Acute Complications    Chronic Complications    Psychosocial Adjustment    Goal Setting Helped patient develop diabetes management plan for weight loss  Preconception Care (if applicable)      PATIENTS GOALS   Learning Objective(s):     Goal The patient agrees to:  Nutrition    Physical Activity    Medications    Monitoring    Problem Solving    Reducing Risk    Health Coping     Patient Self-Evaluation of Goals (Subsequent Visits)  Goal The patient rates self as meeting goals (% of time)  Nutrition 25 - 50%  Physical Activity < 25%  Medications    Monitoring    Problem Solving    Reducing Risk    Health Coping       PERSONALIZED PLAN / SUPPORT  Self-Management Support:     Goal Evaluation:  Most of the time 75% ______________________________________________________________________  Outcomes   Education material provided: yes  If problems or questions, patient to contact team via:  Phone  Time in: 1330     Time out: 1430  Future DSME appointment: -     [name] 01/30/2013 4:49 PM

## 2013-01-30 NOTE — Assessment & Plan Note (Signed)
Lipid panel done on 4/22 with total cholesterol 201, LDL 111, and HDL 67. Given her CHD equivalent with diabetes, and her h/o of HTN and cigarette abuse, I want to get her LDL below 100, so will increase atorvastatin. - Atorvastatin 80mg  daily

## 2013-01-30 NOTE — Assessment & Plan Note (Addendum)
Lab Results  Component Value Date   HGBA1C 10.4 01/22/2013   HGBA1C 9.2* 04/02/2012   HGBA1C 9.9* 03/22/2011     Assessment: Diabetes control: poor control (HgbA1C >9%) Progress toward A1C goal:  unchanged Comments: CBGs do appear to be better controlled than at her first visit  Plan: Medications:  Continue the Lantus 44u BID. Hold the SSI for CBGs <200.  SSI correction scale: 200-250 give 4 units subcutaneously 251-300 give 6 units subcutaneously 301-350 give 8 units subcutaneously  Home glucose monitoring: Frequency: 3 times a day Timing: before meals Instruction/counseling given: reminded to bring blood glucose meter & log to each visit, reminded to bring medications to each visit and discussed diet Educational resources provided: brochure;handout Self management tools provided: copy of home glucose meter download Other plans: Will check Urine Microalbumin/Cr at next visit

## 2013-01-30 NOTE — Assessment & Plan Note (Signed)
BP Readings from Last 3 Encounters:  01/30/13 148/90  01/22/13 145/84  01/22/13 153/93    Lab Results  Component Value Date   NA 144 01/22/2013   K 3.7 01/22/2013   CREATININE 0.88 01/22/2013    Assessment: Blood pressure control: mildly elevated Progress toward BP goal:  unchanged Comments:  Plan: Medications:  Will increase Amlodipine to 10mg  daily Educational resources provided: brochure;handout;video Self management tools provided:

## 2013-01-30 NOTE — Patient Instructions (Addendum)
Your weight today was 244.3 and your blood sugar 230.   You said your goal for the next few weeks is to lose more weight- a few pounds by eating less and exercising more.   To do this you want to:    1- weigh daily and write it down in your book 2- eat more healthy snacks and less cake, chips, and fried foods 3- play basketball with your granddaughters or walk more-      When you go to the store you can park the car farther out to make yourself walk more.     Take stairs when possible instead of the elevator or escalator    Get up to change channels on TV   Exercise to Lose Weight Exercise and a healthy diet may help you lose weight. Your doctor may suggest specific exercises. EXERCISE IDEAS AND TIPS  Choose low-cost things you enjoy doing, such as walking, bicycling, or exercising to workout videos.  Take stairs instead of the elevator.  Walk during your lunch break.  Park your car further away from work or school.  Go to a gym or an exercise class.  Start with 5 to 10 minutes of exercise each day. Build up to 30 minutes of exercise 4 to 6 days a week.  Wear shoes with good support and comfortable clothes.  Stretch before and after working out.  Work out until you breathe harder and your heart beats faster.  Drink extra water when you exercise.  Do not do so much that you hurt yourself, feel dizzy, or get very short of breath. Exercises that burn about 150 calories:  Running 1  miles in 15 minutes.  Playing volleyball for 45 to 60 minutes.  Washing and waxing a car for 45 to 60 minutes.  Playing touch football for 45 minutes.  Walking 1  miles in 35 minutes.  Pushing a stroller 1  miles in 30 minutes.  Playing basketball for 30 minutes.  Raking leaves for 30 minutes.  Bicycling 5 miles in 30 minutes.  Walking 2 miles in 30 minutes.  Dancing for 30 minutes.  Shoveling snow for 15 minutes.  Swimming laps for 20 minutes.  Walking up stairs for 15  minutes.  Bicycling 4 miles in 15 minutes.  Gardening for 30 to 45 minutes.  Jumping rope for 15 minutes.  Washing windows or floors for 45 to 60 minutes.   DASH Diet The DASH diet stands for "Dietary Approaches to Stop Hypertension." It is a healthy eating plan that has been shown to reduce high blood pressure (hypertension) in as little as 14 days, while also possibly providing other significant health benefits. These other health benefits include reducing the risk of breast cancer after menopause and reducing the risk of type 2 diabetes, heart disease, colon cancer, and stroke. Health benefits also include weight loss and slowing kidney failure in patients with chronic kidney disease.  DIET GUIDELINES  Limit salt (sodium). Your diet should contain less than 1500 mg of sodium daily.  Limit refined or processed carbohydrates. Your diet should include mostly whole grains. Desserts and added sugars should be used sparingly.  Include small amounts of heart-healthy fats. These types of fats include nuts, oils, and tub margarine. Limit saturated and trans fats. These fats have been shown to be harmful in the body. CHOOSING FOODS  The following food groups are based on a 2000 calorie diet. See your Registered Dietitian for individual calorie needs. Grains and Grain Products (6  to 8 servings daily)  Eat More Often: Whole-wheat bread, brown rice, whole-grain or wheat pasta, quinoa, popcorn without added fat or salt (air popped).  Eat Less Often: White bread, white pasta, white rice, cornbread. Vegetables (4 to 5 servings daily)  Eat More Often: Fresh, frozen, and canned vegetables. Vegetables may be raw, steamed, roasted, or grilled with a minimal amount of fat.  Eat Less Often/Avoid: Creamed or fried vegetables. Vegetables in a cheese sauce. Fruit (4 to 5 servings daily)  Eat More Often: All fresh, canned (in natural juice), or frozen fruits. Dried fruits without added sugar. One hundred  percent fruit juice ( cup [237 mL] daily).  Eat Less Often: Dried fruits with added sugar. Canned fruit in light or heavy syrup. [employer], Fish, and Poultry (2 servings or less daily. One serving is 3 to 4 oz [85-114 g]).  Eat More Often: Ninety percent or leaner ground beef, tenderloin, sirloin. Round cuts of beef, chicken breast, [place] breast. All fish. Grill, bake, or broil your meat. Nothing should be fried.  Eat Less Often/Avoid: Fatty cuts of meat, [place], or chicken leg, thigh, or wing. Fried cuts of meat or fish. Dairy (2 to 3 servings)  Eat More Often: Low-fat or fat-free milk, low-fat plain or light yogurt, reduced-fat or part-skim cheese.  Eat Less Often/Avoid: Milk (whole, 2%).Whole milk yogurt. Full-fat cheeses. Nuts, Seeds, and Legumes (4 to 5 servings per week)  Eat More Often: All without added salt.  Eat Less Often/Avoid: Salted nuts and seeds, canned beans with added salt. Fats and Sweets (limited)  Eat More Often: Vegetable oils, tub margarines without trans fats, sugar-free gelatin. Mayonnaise and salad dressings.  Eat Less Often/Avoid: Coconut oils, palm oils, butter, stick margarine, cream, half and half, cookies, candy, pie.  E

## 2013-01-30 NOTE — Assessment & Plan Note (Signed)
  Assessment: Progress toward smoking cessation:  smoking less Barriers to progress toward smoking cessation:  withdrawal symptoms Comments:   Plan: Instruction/counseling given:  I advised patient to stop smoking. Educational resources provided:  [employer] account manager) brochure Self management tools provided:    Medications to assist with smoking cessation:  None Patient agreed to the following self-care plans for smoking cessation:    Other plans: She has decreased her cigarette use to 1 cig/day and is working on quitting completely

## 2013-01-30 NOTE — Patient Instructions (Addendum)
**  Your Atorvastatin has been increased to 80mg  daily (from 40mg ) **Your Amlodipine has been increased to 10mg  daily (from 5mg )  **Hold your sliding scale insulin (the Novolog) for blood sugars less than 200.  200-250 give 4 units subcutaneously 251-300 give 6 units subcutaneously 301-350 give 8 units subcutaneously  **Continue to check your blood sugar levels 3 times a day before meals, and continue to record them in the log book.  Congratulations on cutting back your smoking!  **You are doing a great job!!! Keep it up!!!**

## 2013-01-30 NOTE — Progress Notes (Signed)
Patient ID: [name], female   DOB: 1951/04/15, 62 y.o.   MRN: [id number]  Subjective:   Patient ID: [name] female   DOB: 1950/10/06 62 y.o.   MRN: [id number]  HPI: [name] is a 62 y.o. female with PMH HTN, DM2, HLD, CHF, and tobacco abuse presents to the clinic for a routine follow up.  [facility]. A1c was checked and was 10.4. She's been taking Lantus 44 units twice a day and using sliding scale insulin but had not been very compliant with the sliding scale. Continue her insulin regimen requesting that she check her sugars 3 times a day before meals and keep a log of her recordings. She brought her log with her today for her appointment. Overall she's doing well with her control her blood sugars have ranged from on average 140s-150s she did have a few elevated CBGs and it was noted that her next blood sugar after this elevated CBGs was low, with the lowest at 60. Blood sugar was elevated in the clinic to 30, but she had recently eaten some pound cake.   Lipid panel was also checked at her last appointment her cholesterol is 201, HDL 67, and LDL 111. She is currently on atorvastatin 40 mg daily.   She has a history of smoking and is now down to one cigarette per day.  Past Medical History  Diagnosis Date  . Hyperthyroidism, subclinical   . Post menopausal syndrome   . Sleep apnea   . Obesity   . Cardiac defibrillator in situ     Atlas II VR (SJM) implanted by Dr Lovena Le  . Eczema   . Depression   . Lipoma   . Chronic ulcer of leg   . Hyperlipidemia   . Chronic combined systolic and diastolic heart failure     a. EF 35-40% in past;  b. Echo 7/13:  EF 45-50%, Gr 2 diast dysfn, mild AI, mild MAC, trivial MR, mild LAE, PASP 47.  [name] NICM (nonischemic cardiomyopathy)   . Diabetes mellitus   . HTN (hypertension)   . COPD (chronic obstructive pulmonary disease)   . Elevated alkaline phosphatase level     GGT and 5'nucleotidase  8/13 normal  . Hx of cardiac cath     a. [place] 2003 normal;  b. LHC 6/13:  Mild calcification in the LM, o/w normal coronary arteries, EF 45%.    Current Outpatient Prescriptions  Medication Sig Dispense Refill  . albuterol (VENTOLIN HFA) 108 (90 BASE) MCG/ACT inhaler Inhale 2 puffs into the lungs every 6 (six) hours as needed. Shortness of breath  1 Inhaler  6  . amLODipine (NORVASC) 10 MG tablet Take 1 tablet (10 mg total) by mouth daily.  30 tablet  11  . aspirin 81 MG tablet Take 1 tablet (81 mg total) by mouth daily.  30 tablet  11  . atorvastatin (LIPITOR) 80 MG tablet Take 1 tablet (80 mg total) by mouth at bedtime.  30 tablet  11  . citalopram (CELEXA) 20 MG tablet Take 1 tablet (20 mg total) by mouth daily.  30 tablet  11  . Fluticasone-Salmeterol (ADVAIR) 250-50 MCG/DOSE AEPB Inhale 1 puff into the lungs every 12 (twelve) hours.  60 each  11  . furosemide (LASIX) 40 MG tablet Take 1.5 tablets (60 mg total) by mouth daily.  45 tablet  11  . gabapentin (NEURONTIN) 300 MG capsule Take 1 capsule (300  mg total) by mouth at bedtime.  30 capsule  11  . insulin aspart (NOVOLOG) 100 UNIT/ML injection Inject 3-20 Units into the skin 3 (three) times daily before meals. Sliding scale as directed  1 vial  11  . insulin glargine (LANTUS) 100 UNIT/ML injection Inject 0.44 mLs (44 Units total) into the skin 2 (two) times daily.  10 mL  11  . ketoconazole (NIZORAL) 2 % shampoo Apply 1 application topically 3 (three) times a week.  120 mL  3  . lisinopril (PRINIVIL,ZESTRIL) 40 MG tablet Take 1 tablet (40 mg total) by mouth daily.  30 tablet  11  . metoprolol (TOPROL-XL) 200 MG 24 hr tablet Take 1 tablet (200 mg total) by mouth daily.  30 tablet  11  . naproxen (NAPROSYN) 500 MG tablet Take 1 tablet (500 mg total) by mouth 2 (two) times daily as needed (with meals).  60 tablet  3  . spironolactone (ALDACTONE) 25 MG tablet Take 1 tablet (25 mg total) by mouth daily.  30 tablet  11  . triamcinolone (KENALOG)  0.1 % ointment Apply 1 application topically as needed. rash      . glucose blood (ONE TOUCH ULTRA TEST) test strip 1 each by Other route 3 (three) times daily. Use as instructed by doctor up to 3x daily,dx code 250.02 insulin requiring      . nitroGLYCERIN (NITROSTAT) 0.4 MG SL tablet Place 1 tablet (0.4 mg total) under the tongue every 5 (five) minutes as needed for chest pain.  25 tablet  3   No current facility-administered medications for this visit.   Family History  Problem Relation Age of Onset  . Stroke Mother   . Seizures Father   . Diabetes Sister   . Asthma Maternal Aunt     aunts  . Asthma Maternal Uncle     uncles  . Heart disease Father   . Heart disease Paternal Aunt     aunts  . Heart disease Paternal Uncle     uncles  . Heart disease Maternal Aunt     aunts  . Heart disease Maternal Uncle     uncles  . Heart disease Maternal Grandfather    History   Social History  . Marital Status: Married    Spouse Name: Alroy Dust    Number of Children: N/A  . Years of Education: N/A   Occupational History  . disabled    Social History Main Topics  . Smoking status: Light Tobacco Smoker -- 0.25 packs/day for 35 years    Types: Cigarettes  . Smokeless tobacco: None     Comment: currently smoking 3 cigs daily.   . Alcohol Use: 1.0 oz/week     Comment: Quit 05/2007  . Drug Use: No  . Sexually Active: None   Other Topics Concern  . None   Social History Narrative   ** Merged History Encounter **       Married   Review of Systems: A 10 point ROS was performed; pertinent positives and negatives were noted in the HPI   Objective:  Physical Exam: Filed Vitals:   01/30/13 1457  BP: 148/90  Pulse: 65  Temp: 97 F (36.1 C)  TempSrc: Oral  Height: 5\' 1"  (1.549 m)  Weight: 244 lb 3.2 oz (110.768 kg)  SpO2: 95%   Constitutional: Vital signs reviewed.  Patient is a well-developed and well-nourished female in no acute distress and cooperative with exam.    Head: Normocephalic and atraumatic Eyes:  PERRL, EOMI, conjunctivae normal, No scleral icterus.  Neck: Supple, Trachea midline normal ROM Cardiovascular: RRR, S1 normal, S2 normal, no MRG, pulses symmetric and intact bilaterally Pulmonary/Chest: CTAB, no wheezes, rales, or rhonchi Abdominal: Soft. Non-tender, non-distended GU: No CVA tenderness Musculoskeletal: No joint deformities, erythema Neurological: A&O x3, non-focal Skin: Warm, dry and intact. No rash, cyanosis, or clubbing.  Psychiatric: Normal mood and affect.   Assessment & Plan:   Please refer to Problem List based Assessment and Plan

## 2013-01-30 NOTE — Progress Notes (Signed)
Case discussed with Dr. Eulas Post at the time of the visit. We reviewed the resident's history and exam and pertinent patient test results. I agree with the assessment, diagnosis and plan of care documented in the resident's note.  Her AM CBGs were low when she used the sliding scale insulin the previous night.  The goal was to liberalize the nighttime sliding scale insulin but continue the morning and lunch sliding scale insulin as previously prescribed.  There was some concern about the confusion that could be caused by using 2 different sliding scale insulin regimens, so we will see how she does with less stringent sliding scale coverage during all three doses.

## 2013-02-01 ENCOUNTER — Encounter: Payer: Self-pay | Admitting: Internal Medicine

## 2013-02-19 ENCOUNTER — Ambulatory Visit (INDEPENDENT_AMBULATORY_CARE_PROVIDER_SITE_OTHER): Payer: PRIVATE HEALTH INSURANCE | Admitting: Internal Medicine

## 2013-02-19 ENCOUNTER — Encounter: Payer: Self-pay | Admitting: Internal Medicine

## 2013-02-19 VITALS — BP 149/90 | HR 75 | Temp 97.5°F | Wt 239.6 lb

## 2013-02-19 DIAGNOSIS — I10 Essential (primary) hypertension: Secondary | ICD-10-CM

## 2013-02-19 DIAGNOSIS — F17200 Nicotine dependence, unspecified, uncomplicated: Secondary | ICD-10-CM

## 2013-02-19 DIAGNOSIS — I5042 Chronic combined systolic (congestive) and diastolic (congestive) heart failure: Secondary | ICD-10-CM

## 2013-02-19 DIAGNOSIS — J449 Chronic obstructive pulmonary disease, unspecified: Secondary | ICD-10-CM

## 2013-02-19 DIAGNOSIS — E119 Type 2 diabetes mellitus without complications: Secondary | ICD-10-CM

## 2013-02-19 LAB — GLUCOSE, CAPILLARY: Glucose-Capillary: 232 mg/dL — ABNORMAL HIGH (ref 70–99)

## 2013-02-19 NOTE — Progress Notes (Signed)
Case discussed with Dr. Eula Fried (at time of visit, soon after the resident saw the patient).  We reviewed the resident's history and exam and pertinent patient test results.  I agree with the assessment, diagnosis, and plan of care documented in the resident's note.

## 2013-02-19 NOTE — Patient Instructions (Addendum)
General Instructions: Please follow up with [facility] and bring your glucose meter to your next visit.  Continue your current regimen and monitor for any signs of symptoms of low blood sugar. Call the clinic if your CBGs <70 [id number]  Continue to exercise and lose weight, but eat regularly and do not skip meals since you are taking insulin  Continue to monitor your blood pressure  You have been referred to an eye doctor for your eye exam and we are scheduling your colonoscopy  It was a pleasure meeting your   Treatment Goals:  Goals (1 Years of Data) as of 02/19/13         01/22/13 11/08/10     Result Component    . LDL CALC < 100  111 93      Progress Toward Treatment Goals:  Treatment Goal 02/19/2013  Hemoglobin A1C unable to assess  Blood pressure unchanged  Stop smoking smoking less  Prevent falls at goal    Self Care Goals & Plans:  Self Care Goal 02/19/2013  Manage my medications take my medicines as prescribed; bring my medications to every visit  Monitor my health keep track of my blood glucose; bring my glucose meter and log to each visit  Eat healthy foods -  Be physically active take a walk every day  Stop smoking call [employer] (1-800-QUIT-NOW)    Home Blood Glucose Monitoring 02/19/2013  Check my blood sugar 3 times a day  When to check my blood sugar before meals     Care Management & Community Referrals:  Referral 01/30/2013  Referrals made for care management support diabetes educator; nutritionist  Referrals made to community resources smoking cessation; nutrition    Low Blood Sugar Low blood sugar (hypoglycemia) means that the level of sugar in your blood is lower than it should be. Signs of low blood sugar include:  Getting sweaty.  Feeling hungry.  Feeling dizzy or weak.  Feeling sleepier than normal.  Feeling nervous.  Headaches.  Having a fast heartbeat. Low blood sugar can happen fast and can be an emergency. Your doctor can do  tests to check your blood sugar level. You can have low blood sugar and not have diabetes. HOME CARE  Check your blood sugar as told by your doctor. If it is less than 70 mg/dl or as told by your doctor, take 1 of the following:  3 to 4 glucose tablets.   cup clear juice.   cup soda pop, not diet.  1 cup milk.  5 to 6 hard candies.  Recheck blood sugar after 15 minutes. Repeat until it is at the right level.  Eat a snack if it is more than 1 hour until the next meal.  Only take medicine as told by your doctor.  Do not skip meals. Eat on time.  Do not drink alcohol except with meals.  Check your blood glucose before driving.  Check your blood glucose before and after exercise.  Always carry treatment with you, such as glucose pills.  Always wear a medical alert bracelet if you have diabetes. GET HELP RIGHT AWAY IF:   Your blood glucose goes below 70 mg/dl or as told by your doctor, and you:  Are confused.  Are not able to swallow.  Pass out (faint).  You cannot treat yourself. You may need someone to help you.  You have low blood sugar problems often.  You have problems from your medicines.  You are not feeling better after  3 to 4 days.  You have vision changes. MAKE SURE YOU:   Understand these instructions.  Will watch this condition.  Will get help right away if you are not doing well or get worse. Document Released: 12/14/2009 Document Revised: 12/12/2011 Document Reviewed: 12/14/2009 [facility] Patient Information 2013 [place].

## 2013-02-20 NOTE — Assessment & Plan Note (Signed)
Lab Results  Component Value Date   HGBA1C 10.4 01/22/2013   HGBA1C 9.2* 04/02/2012   HGBA1C 9.9* 03/22/2011    Assessment: Diabetes control: poor control (HgbA1C >9%) Progress toward A1C goal:  unable to assess Comments: did not bring her meter to clinic visit today.  Denies any hypoglycemia symptoms but does report cbg's have occasionally been in the 70s in the AM if she did not eat dinner.    Plan: Medications:  Continue current regimen with Lantus 44units BID and holding SSI for CBGs <200 as per Dr. Eulas Post with adjusted customized SSI correction scale: 4 units: CBG 200-250; 6 units: CBG 251-300; 8 units: CBG 301-350 Home glucose monitoring: Frequency: 3 times a day Timing: before meals Instruction/counseling given: reminded to bring meter and medications to [facility] visit.  Educational resources provided:   Self management tools provided:   Other plans: will need to follow up with Hamdi plyler and bring in meter to next visit.

## 2013-02-24 NOTE — Assessment & Plan Note (Signed)
Follows with Dr. Stanford Breed.  Recently saw him on 4/22.    Continue current regimen.

## 2013-02-24 NOTE — Progress Notes (Signed)
Subjective:   Patient ID: [name] female   DOB: [birth year] 62 y.o.   MRN: [id number]  HPI: [name] is a 62 y.o. African American female with PMH of CHF, DM, and hyperthyroidism presenting to [facility] today for routine follow up visit.    She was last seen by Dr. Eulas Post in April 2014 and was found to have a HbA1c of 10.4 with an adjusted insulin sliding scale.  She did not bring her meter to clinic today claiming she was in a hurry but says that sugars have improved since the new scale to which she is compliant.  However, she does report CBGs occasionally around 70s when she does not eat at night.    She has no major complaints at this time and will follow up with [facility] as well.  In regards to health maintenance, she has been referred for colonoscopy and was unable to provide a urine sample today.  She continues to smoke cigarettes, 1-2 cigs per day and will continue to cut down and hopefully stop completely.    Past Medical History  Diagnosis Date  . Hyperthyroidism, subclinical   . Post menopausal syndrome   . Sleep apnea   . Obesity   . Cardiac defibrillator in situ     Atlas II VR (SJM) implanted by Dr Lovena Le  . Eczema   . Depression   . Lipoma   . Chronic ulcer of leg   . Hyperlipidemia   . Chronic combined systolic and diastolic heart failure     a. EF 35-40% in past;  b. Echo 7/13:  EF 45-50%, Gr 2 diast dysfn, mild AI, mild MAC, trivial MR, mild LAE, PASP 47.  [name] NICM (nonischemic cardiomyopathy)   . Diabetes mellitus   . HTN (hypertension)   . COPD (chronic obstructive pulmonary disease)   . Elevated alkaline phosphatase level     GGT and 5'nucleotidase 8/13 normal  . Hx of cardiac cath     a. [place] 2003 normal;  b. LHC 6/13:  Mild calcification in the LM, o/w normal coronary arteries, EF 45%.    Current Outpatient Prescriptions  Medication Sig Dispense Refill  . albuterol (VENTOLIN HFA) 108 (90 BASE) MCG/ACT inhaler Inhale 2 puffs into the lungs every 6 (six)  hours as needed. Shortness of breath  1 Inhaler  6  . amLODipine (NORVASC) 10 MG tablet Take 1 tablet (10 mg total) by mouth daily.  30 tablet  11  . aspirin 81 MG tablet Take 1 tablet (81 mg total) by mouth daily.  30 tablet  11  . atorvastatin (LIPITOR) 80 MG tablet Take 1 tablet (80 mg total) by mouth at bedtime.  30 tablet  11  . citalopram (CELEXA) 20 MG tablet Take 1 tablet (20 mg total) by mouth daily.  30 tablet  11  . Fluticasone-Salmeterol (ADVAIR) 250-50 MCG/DOSE AEPB Inhale 1 puff into the lungs every 12 (twelve) hours.  60 each  11  . furosemide (LASIX) 40 MG tablet Take 1.5 tablets (60 mg total) by mouth daily.  45 tablet  11  . gabapentin (NEURONTIN) 300 MG capsule Take 1 capsule (300 mg total) by mouth at bedtime.  30 capsule  11  . glucose blood (ONE TOUCH ULTRA TEST) test strip 1 each by Other route 3 (three) times daily. Use as instructed by doctor up to 3x daily,dx code 250.02 insulin requiring      . insulin aspart (NOVOLOG) 100 UNIT/ML injection Inject 3-20 Units into  the skin 3 (three) times daily before meals. Sliding scale as directed  1 vial  11  . insulin glargine (LANTUS) 100 UNIT/ML injection Inject 0.44 mLs (44 Units total) into the skin 2 (two) times daily.  10 mL  11  . ketoconazole (NIZORAL) 2 % shampoo Apply 1 application topically 3 (three) times a week.  120 mL  3  . lisinopril (PRINIVIL,ZESTRIL) 40 MG tablet Take 1 tablet (40 mg total) by mouth daily.  30 tablet  11  . metoprolol (TOPROL-XL) 200 MG 24 hr tablet Take 1 tablet (200 mg total) by mouth daily.  30 tablet  11  . naproxen (NAPROSYN) 500 MG tablet Take 1 tablet (500 mg total) by mouth 2 (two) times daily as needed (with meals).  60 tablet  3  . nitroGLYCERIN (NITROSTAT) 0.4 MG SL tablet Place 1 tablet (0.4 mg total) under the tongue every 5 (five) minutes as needed for chest pain.  25 tablet  3  . spironolactone (ALDACTONE) 25 MG tablet Take 1 tablet (25 mg total) by mouth daily.  30 tablet  11  .  triamcinolone (KENALOG) 0.1 % ointment Apply 1 application topically as needed. rash       No current facility-administered medications for this visit.   Family History  Problem Relation Age of Onset  . Stroke Mother   . Seizures Father   . Diabetes Sister   . Asthma Maternal Aunt     aunts  . Asthma Maternal Uncle     uncles  . Heart disease Father   . Heart disease Paternal Aunt     aunts  . Heart disease Paternal Uncle     uncles  . Heart disease Maternal Aunt     aunts  . Heart disease Maternal Uncle     uncles  . Heart disease Maternal Grandfather    History   Social History  . Marital Status: Married    Spouse Name: Alroy Dust    Number of Children: N/A  . Years of Education: N/A   Occupational History  . disabled    Social History Main Topics  . Smoking status: Light Tobacco Smoker -- 0.25 packs/day for 35 years    Types: Cigarettes  . Smokeless tobacco: None     Comment: currently smoking 1-2 cigs daily.   . Alcohol Use: No     Comment: Quit 05/2007  . Drug Use: No  . Sexually Active: None   Other Topics Concern  . None   Social History Narrative   ** Merged History Encounter **       Married   Review of Systems: Constitutional: Denies fever, chills, diaphoresis, appetite change and fatigue.  HEENT: Denies photophobia, eye pain, redness, hearing loss, ear pain, congestion, sore throat, rhinorrhea, sneezing, mouth sores, trouble swallowing, neck pain, neck stiffness and tinnitus.   Respiratory: Denies SOB, DOE, cough, chest tightness,  and wheezing.   Cardiovascular: Denies chest pain, palpitations and leg swelling.  Gastrointestinal: Denies nausea, vomiting, abdominal pain, diarrhea, constipation, blood in stool and abdominal distention.  Genitourinary: Denies dysuria, urgency, frequency, hematuria, flank pain and difficulty urinating.  Endocrine: Denies: hot or cold intolerance, sweats, changes in hair or nails, polyuria, polydipsia. Musculoskeletal:  Denies myalgias, back pain, joint swelling, arthralgias and gait problem.  Skin: Denies pallor, rash and wound.  Neurological: Denies dizziness, seizures, syncope, weakness, light-headedness, numbness and headaches.  Hematological: Denies adenopathy. Easy bruising, personal or family bleeding history  Psychiatric/Behavioral: Denies suicidal ideation, mood changes, confusion, nervousness,  sleep disturbance and agitation  Objective:  Physical Exam: Filed Vitals:   02/19/13 1403  BP: 149/90  Pulse: 75  Temp: 97.5 F (36.4 C)  TempSrc: Oral  Weight: 239 lb 9.6 oz (108.682 kg)  SpO2: 97%   General: AAOX3, NAD Head: Normocephalic and atraumatic.  Eyes: Vision grossly intact, PERRLA, EOMI  Mouth: Pharynx pink and moist, no erythema, and no exudates.  Neck: Supple, full ROM Lungs: Normal respiratory effort, CTA B/L, no accessory muscle use, no crackles, and no wheezes. Heart: Normal rate, regular rhythm, no murmur, no gallop, and no rub.  Abdomen: Soft, obese, non-tender, normal bowel sounds, non-distended Msk: No joint swelling, no joint warmth, and no redness over joints.  Pulses: 2+ DP/PT pulses bilaterally Extremities: No cyanosis, clubbing, edema Neurologic: Alert & oriented X3, cranial nerves II-XII intact, strength normal in all extremities, sensation intact to light touch, and gait normal.  Skin: Turgor normal and no rashes.  Psych: Oriented X3, memory intact for recent and remote, normally interactive, good eye contact, not anxious appearing, and not depressed appearing. Assessment & Plan:  Discussed with Dr. Stann Mainland  Follow up with [name] plyler for DM and nutrition.

## 2013-02-24 NOTE — Assessment & Plan Note (Signed)
  Assessment: Progress toward smoking cessation:  smoking less Barriers to progress toward smoking cessation:  lack of motivation to quit Comments: continues to smoke 1-2 cigs/day  Plan: Instruction/counseling given:  Counseling given, not willing to quit at this time, but continues to cut down Educational resources provided:  [facility] (1-800-QUIT-NOW) brochure Self management tools provided:  smoking cessation plan (STAR Quit Plan) Medications to assist with smoking cessation:  Declines any medication assistance at this time.  Patient agreed to the following self-care plans for smoking cessation: call [facility] (1-800-QUIT-NOW)

## 2013-02-24 NOTE — Assessment & Plan Note (Signed)
BP Readings from Last 3 Encounters:  02/19/13 149/90  01/30/13 148/90  01/22/13 145/84   Lab Results  Component Value Date   NA 144 01/22/2013   K 3.7 01/22/2013   CREATININE 0.88 01/22/2013   Assessment: Blood pressure control: mildly elevated Progress toward BP goal:  unchanged Comments: recent stress due to family matters and recent deaths  Plan: Medications: continue current medications: norvasc 10mg , lasix 60mg , lisinopril 40mg , metoprolol xl 200mg , and aldactone 25mg  Educational resources provided: brochure Self management tools provided: home blood pressure logbook

## 2013-02-27 ENCOUNTER — Ambulatory Visit (INDEPENDENT_AMBULATORY_CARE_PROVIDER_SITE_OTHER): Payer: PRIVATE HEALTH INSURANCE | Admitting: *Deleted

## 2013-02-27 DIAGNOSIS — I428 Other cardiomyopathies: Secondary | ICD-10-CM

## 2013-02-27 LAB — ICD DEVICE OBSERVATION
BATTERY VOLTAGE: 2.52 V
BRDY-0002RV: 40 {beats}/min
CHARGE TIME: 0 s
DEVICE MODEL ICD: 436280
RV LEAD AMPLITUDE: 12.1 mv
RV LEAD IMPEDENCE ICD: 445 Ohm
TOT-0006: 20140422000000
TOT-0007: 3
TOT-0008: 0
TOT-0009: 4
TOT-0010: 45
TZAT-0001SLOWVT: 1
TZAT-0004SLOWVT: 8
TZAT-0012SLOWVT: 200 ms
TZAT-0013SLOWVT: 1
TZAT-0018SLOWVT: NEGATIVE
TZAT-0019SLOWVT: 7.5 V
TZAT-0020SLOWVT: 1 ms
TZON-0003SLOWVT: 300 ms
TZON-0004SLOWVT: 16
TZON-0005SLOWVT: 6
TZST-0001SLOWVT: 2
TZST-0001SLOWVT: 3
TZST-0001SLOWVT: 4
TZST-0001SLOWVT: 5
TZST-0003SLOWVT: 25 J
TZST-0003SLOWVT: 36 J
TZST-0003SLOWVT: 36 J
TZST-0003SLOWVT: 36 J
VENTRICULAR PACING ICD: 1 pct

## 2013-02-27 NOTE — Progress Notes (Signed)
ICD interrogation for battery data.

## 2013-03-07 ENCOUNTER — Ambulatory Visit (HOSPITAL_BASED_OUTPATIENT_CLINIC_OR_DEPARTMENT_OTHER): Payer: PRIVATE HEALTH INSURANCE | Admitting: Dietician

## 2013-03-07 ENCOUNTER — Encounter: Payer: Self-pay | Admitting: Internal Medicine

## 2013-03-07 VITALS — Wt 241.1 lb

## 2013-03-07 DIAGNOSIS — E119 Type 2 diabetes mellitus without complications: Secondary | ICD-10-CM

## 2013-03-07 NOTE — Patient Instructions (Addendum)
Your goals this month to continue to loose weight and lower your blood sugars:                           Park car farther, walk that extra block with your neighbor                           Eat more fruits and Vegetables- grab a piece of fruit for snacks is better than nothing                            Try to eat 3 meals a day- BEST to eat something in the morning                           Peanut butter sandwich is a great meal     Peanuts are a great snack! Put unsalted peanuts and raisons in small baggies  Please make a follow up appointment for 3-4 weeks

## 2013-03-07 NOTE — Progress Notes (Signed)
Medical Nutrition Therapy:  Appt start time: 0930 end time:  1030.  Assessment:  Primary concerns today: Blood sugar control.   Review of patient's typical day reveals sporadic meal times throughout the day. Diet consists of high fat foods, low intake of vegetables, and may be larger than recommended portions. Analysis of blood glucose from the past 90 days reveal a pattern of increasing throughout the day (lower in the morning, high in the evening), indicating increased insulin may be needed for meal coverage: Estimate 55- 132 units needed for patient's current weight (110kg), Current dose: 92- 106 unit (88 units of Lantus and 4- 8 units correction for blood sugar 200- 350) Based on current total daily dose minimum correction factor should be  6 units (1800/96 = 19 mg/d to lower blood glucose 1 unit); ICR = 1:5 (450/96)  Recent physical activity includes walking 10 minutes some days.  Progress Towards Goal(s):  In progress.   Nutritional Diagnosis:  NB-1.1 Food and nutrition-related knowledge deficit As related to insulin regimen.  As evidenced by flucuating blood glucose.    Intervention:  1- Nutrition education about the relationship between regulating meals and regulating blood sugar was provided to the patient. 2- Coordination of care- Adjusting insulin to 40- 50 units Lantus and 10 units correction for all meal coverage is recommended. Monitoring/Evaluation:  Dietary intake, exercise, and body weight 3- 4 weeks.

## 2013-03-14 ENCOUNTER — Telehealth: Payer: Self-pay | Admitting: Dietician

## 2013-03-14 NOTE — Telephone Encounter (Addendum)
Asked to call patient by Dr. Eula Fried to adjust her insulin regimen. Patient agreed to take 50 units lantus before breakfast once a day, 10 units humalog there times a day before meals. She acknowledged understanding by teaching back and also agrees to not take Humalog if she skips a meal. Encouraged patient to check blood sugar more often especially the first few days on the new regimen. Patient to start this today since she has not taken any lantus yet.    CDE phone number provided and patient encouraged to call with questions.

## 2013-03-14 NOTE — Addendum Note (Signed)
Addended by: Resa Miner on: 03/14/2013 02:17 PM   Modules accepted: Orders

## 2013-03-15 NOTE — Telephone Encounter (Signed)
Thank you :)

## 2013-03-26 ENCOUNTER — Encounter: Payer: Self-pay | Admitting: Internal Medicine

## 2013-04-01 ENCOUNTER — Other Ambulatory Visit: Payer: Self-pay | Admitting: Gastroenterology

## 2013-04-01 ENCOUNTER — Ambulatory Visit (INDEPENDENT_AMBULATORY_CARE_PROVIDER_SITE_OTHER): Payer: PRIVATE HEALTH INSURANCE | Admitting: *Deleted

## 2013-04-01 DIAGNOSIS — I428 Other cardiomyopathies: Secondary | ICD-10-CM

## 2013-04-01 LAB — ICD DEVICE OBSERVATION
DEVICE MODEL ICD: 436280
TZAT-0001SLOWVT: 1
TZAT-0004SLOWVT: 8
TZAT-0012SLOWVT: 200 ms
TZAT-0013SLOWVT: 1
TZAT-0018SLOWVT: NEGATIVE
TZAT-0019SLOWVT: 7.5 V
TZAT-0020SLOWVT: 1 ms
TZON-0003SLOWVT: 300 ms
TZON-0004SLOWVT: 16
TZON-0005SLOWVT: 6
TZST-0001SLOWVT: 2
TZST-0001SLOWVT: 3
TZST-0001SLOWVT: 4
TZST-0001SLOWVT: 5
TZST-0003SLOWVT: 25 J
TZST-0003SLOWVT: 36 J
TZST-0003SLOWVT: 36 J
TZST-0003SLOWVT: 36 J

## 2013-04-01 NOTE — Progress Notes (Signed)
ICD check in clinic for battery check. Battery voltage 2.55 V since 03-29-12. Patient notifier demonstrated for pt and pt aware to call office if felt. ROV in 2 mths w/device clinic which appointment was made for 05-30-13 @ 1430 for device clinic.

## 2013-04-08 ENCOUNTER — Encounter: Payer: Self-pay | Admitting: Internal Medicine

## 2013-04-08 ENCOUNTER — Ambulatory Visit (INDEPENDENT_AMBULATORY_CARE_PROVIDER_SITE_OTHER): Payer: PRIVATE HEALTH INSURANCE | Admitting: Internal Medicine

## 2013-04-08 VITALS — BP 151/81 | HR 92 | Temp 97.7°F | Wt 246.0 lb

## 2013-04-08 DIAGNOSIS — I428 Other cardiomyopathies: Secondary | ICD-10-CM

## 2013-04-08 DIAGNOSIS — I5042 Chronic combined systolic (congestive) and diastolic (congestive) heart failure: Secondary | ICD-10-CM

## 2013-04-08 DIAGNOSIS — I10 Essential (primary) hypertension: Secondary | ICD-10-CM

## 2013-04-08 DIAGNOSIS — E119 Type 2 diabetes mellitus without complications: Secondary | ICD-10-CM

## 2013-04-08 DIAGNOSIS — Z0000 Encounter for general adult medical examination without abnormal findings: Secondary | ICD-10-CM

## 2013-04-08 DIAGNOSIS — J449 Chronic obstructive pulmonary disease, unspecified: Secondary | ICD-10-CM

## 2013-04-08 DIAGNOSIS — F17200 Nicotine dependence, unspecified, uncomplicated: Secondary | ICD-10-CM

## 2013-04-08 LAB — POCT GLYCOSYLATED HEMOGLOBIN (HGB A1C): Hemoglobin A1C: 9.3

## 2013-04-08 LAB — GLUCOSE, CAPILLARY: Glucose-Capillary: 174 mg/dL — ABNORMAL HIGH (ref 70–99)

## 2013-04-08 MED ORDER — INSULIN LISPRO 100 UNIT/ML ~~LOC~~ SOLN: SUBCUTANEOUS | Status: DC

## 2013-04-08 MED ORDER — INSULIN GLARGINE 100 UNIT/ML ~~LOC~~ SOLN: 45.0000 [IU] | Freq: Every day | SUBCUTANEOUS | Status: DC

## 2013-04-08 MED ORDER — FUROSEMIDE 40 MG PO TABS: 40.0000 mg | ORAL_TABLET | Freq: Two times a day (BID) | ORAL | Status: DC

## 2013-04-08 NOTE — Assessment & Plan Note (Signed)
Lab Results  Component Value Date   HGBA1C 9.3 04/08/2013   HGBA1C 10.4 01/22/2013   HGBA1C 9.2* 04/02/2012    Assessment: Diabetes control: poor control (HgbA1C >9%) Progress toward A1C goal:  improved Comments: A1C improved but endorses diaphoresis after taking short acting insulin especially in the morning.  Reviewing meter, lowest blood sugars in AM and does admit to skipping meals.   Plan: Medications:  decrease lantus to 45 units qhs, decrease morning humalog dose to 8 units and then continue 10 units with lunch and dinner.   Home glucose monitoring: Frequency: 3 times a day Timing: before meals Instruction/counseling given: reminded to get eye exam, reminded to bring blood glucose meter & log to each visit, reminded to bring medications to each visit, discussed foot care, discussed the need for weight loss, discussed diet and discussed sick day management Educational resources provided: brochure Self management tools provided: copy of home glucose meter download Other plans: counseled extensively on not skipping meals, adhering to regimen, monitoring cbgs, signs of symptoms of hyper and hypoglycemia, handouts provided as well.  Also recommended phone follow up with [name] plyler to monitor sugars and advise on diet as needed in at least 2 weeks.

## 2013-04-08 NOTE — Assessment & Plan Note (Signed)
Follow up with cardiology 2 weeks.

## 2013-04-08 NOTE — Assessment & Plan Note (Signed)
  Assessment: Progress toward smoking cessation:  smoking the same amount Barriers to progress toward smoking cessation:    Comments: continues to smoke along with her husband. Counseling provided again today and cessation strongly encouraged and advised.   Plan: Instruction/counseling given:  I counseled patient on the dangers of tobacco use, advised patient to stop smoking, and reviewed strategies to maximize success. Educational resources provided:  [facility] [employer] account manager) brochure Self management tools provided:  smoking cessation plan (STAR Quit Plan) Medications to assist with smoking cessation:  None Patient agreed to the following self-care plans for smoking cessation: go to the [employer] (www.quitlinenc.com);cut down the number of cigarettes smoked  Other plans: will consider the patch and calling the hotline

## 2013-04-08 NOTE — Assessment & Plan Note (Signed)
Follow up with Dr. Jacalyn Lefevre office, BP control and medication management.   -July 25 @2pm  with Annia Belt

## 2013-04-08 NOTE — Assessment & Plan Note (Signed)
optho appointment this month and colonoscopy next week

## 2013-04-08 NOTE — Progress Notes (Signed)
Subjective:   Patient ID: [name] female   DOB: 07-Sep-1951 62 y.o.   MRN: [id number]  HPI: [name] is a 62 y.o. African American female with PMH of CHF EF 45-50%, DM, and hyperthyroidism presenting to [facility] today for routine follow up visit. She reports sweating after taking her short acting insulin especially in the mornings and continues to smoke cigarettes especially with all her family stress lately.  Her husband was recently admitted to the hospital and now home and she also recently lost her son in law to cancer.  She claims she has not been able to work on her weight and diet as much and has been eating lots of salt which is probably why she feels like her legs are more swollen than normal.   She is scheduled for her screening colonoscopy at the end of the week and eye exam this month as well.  Past Medical History  Diagnosis Date  . Hyperthyroidism, subclinical   . Post menopausal syndrome   . Sleep apnea   . Obesity   . Cardiac defibrillator in situ     Atlas II VR (SJM) implanted by Dr Lovena Le  . Eczema   . Depression   . Lipoma   . Chronic ulcer of leg   . Hyperlipidemia   . Chronic combined systolic and diastolic heart failure     a. EF 35-40% in past;  b. Echo 7/13:  EF 45-50%, Gr 2 diast dysfn, mild AI, mild MAC, trivial MR, mild LAE, PASP 47.  [name] NICM (nonischemic cardiomyopathy)   . Diabetes mellitus   . HTN (hypertension)   . COPD (chronic obstructive pulmonary disease)   . Elevated alkaline phosphatase level     GGT and 5'nucleotidase 8/13 normal  . Hx of cardiac cath     a. [place] 2003 normal;  b. LHC 6/13:  Mild calcification in the LM, o/w normal coronary arteries, EF 45%.    Current Outpatient Prescriptions  Medication Sig Dispense Refill  . albuterol (VENTOLIN HFA) 108 (90 BASE) MCG/ACT inhaler Inhale 2 puffs into the lungs every 6 (six) hours as needed. Shortness of breath  1 Inhaler  6  . amLODipine (NORVASC) 10 MG tablet Take 1 tablet (10 mg  total) by mouth daily.  30 tablet  11  . aspirin 81 MG tablet Take 1 tablet (81 mg total) by mouth daily.  30 tablet  11  . atorvastatin (LIPITOR) 80 MG tablet Take 1 tablet (80 mg total) by mouth at bedtime.  30 tablet  11  . citalopram (CELEXA) 20 MG tablet Take 1 tablet (20 mg total) by mouth daily.  30 tablet  11  . Fluticasone-Salmeterol (ADVAIR) 250-50 MCG/DOSE AEPB Inhale 1 puff into the lungs every 12 (twelve) hours.  60 each  11  . furosemide (LASIX) 40 MG tablet Take 1.5 tablets (60 mg total) by mouth daily.  45 tablet  11  . gabapentin (NEURONTIN) 300 MG capsule Take 1 capsule (300 mg total) by mouth at bedtime.  30 capsule  11  . glucose blood (ONE TOUCH ULTRA TEST) test strip 1 each by Other route 3 (three) times daily. Use as instructed by doctor up to 3x daily,dx code 250.02 insulin requiring      . insulin aspart (NOVOLOG) 100 UNIT/ML injection Inject 10 Units into the skin 3 (three) times daily before meals. If you skip a meal, Do not take that mealtime novolog      . insulin glargine (  LANTUS) 100 UNIT/ML injection Inject 50 Units into the skin daily with breakfast.      . ketoconazole (NIZORAL) 2 % shampoo Apply 1 application topically 3 (three) times a week.  120 mL  3  . lisinopril (PRINIVIL,ZESTRIL) 40 MG tablet Take 1 tablet (40 mg total) by mouth daily.  30 tablet  11  . metoprolol (TOPROL-XL) 200 MG 24 hr tablet Take 1 tablet (200 mg total) by mouth daily.  30 tablet  11  . naproxen (NAPROSYN) 500 MG tablet Take 1 tablet (500 mg total) by mouth 2 (two) times daily as needed (with meals).  60 tablet  3  . nitroGLYCERIN (NITROSTAT) 0.4 MG SL tablet Place 1 tablet (0.4 mg total) under the tongue every 5 (five) minutes as needed for chest pain.  25 tablet  3  . spironolactone (ALDACTONE) 25 MG tablet Take 1 tablet (25 mg total) by mouth daily.  30 tablet  11  . triamcinolone (KENALOG) 0.1 % ointment Apply 1 application topically as needed. rash       No current  facility-administered medications for this visit.   Family History  Problem Relation Age of Onset  . Stroke Mother   . Seizures Father   . Diabetes Sister   . Asthma Maternal Aunt     aunts  . Asthma Maternal Uncle     uncles  . Heart disease Father   . Heart disease Paternal Aunt     aunts  . Heart disease Paternal Uncle     uncles  . Heart disease Maternal Aunt     aunts  . Heart disease Maternal Uncle     uncles  . Heart disease Maternal Grandfather    History   Social History  . Marital Status: Married    Spouse Name: Alroy Dust    Number of Children: N/A  . Years of Education: N/A   Occupational History  . disabled    Social History Main Topics  . Smoking status: Light Tobacco Smoker -- 0.25 packs/day for 35 years    Types: Cigarettes  . Smokeless tobacco: Not on file     Comment: currently smoking 1-2 cigs daily.   . Alcohol Use: No     Comment: Quit 05/2007  . Drug Use: No  . Sexually Active: Not on file   Other Topics Concern  . Not on file   Social History Narrative   ** Merged History Encounter **       Married   Review of Systems:  Constitutional:  Fatigue, diaphoresis after insulin.  Denies fever, chills, appetite change and fatigue.   HEENT:  Denies congestion, sore throat, rhinorrhea, sneezing, mouth sores, trouble swallowing, neck pain   Respiratory:  DOE.  Denies SOB, cough, and wheezing.   Cardiovascular:  Lower extremity swelling.  Denies palpitations.  Gastrointestinal:  Denies nausea, vomiting, abdominal pain, diarrhea, constipation, blood in stool and abdominal distention.   Genitourinary:  Denies dysuria, urgency, frequency, hematuria, flank pain and difficulty urinating.   Musculoskeletal:  Denies myalgias, back pain, joint swelling, arthralgias and gait problem.   Skin:  Itching on back.  Denies pallor and wound.   Neurological:  Denies dizziness, seizures, syncope, weakness, light-headedness, numbness and headaches.    Objective:    Physical Exam: Filed Vitals:   04/08/13 1027  BP: 156/91  Pulse: 100  Temp: 97.7 F (36.5 C)  TempSrc: Oral  Weight: 246 lb (111.585 kg)  SpO2: 94%   Vitals reviewed. General: sitting in chair, NAD  HEENT: PERRL, EOMI, no scleral icterus Cardiac: tachycardia, no rubs, murmurs or gallops Pulm: clear to auscultation bilaterally, no wheezes, rales, or rhonchi Abd: soft, obese, nontender, nondistended, BS present Ext: warm and well perfused, b/l lower extremity edema non pitting.  3 slightly raised itching bumps on mid back.    Neuro: alert and oriented X3, cranial nerves II-XII grossly intact, strength and sensation to light touch equal in bilateral upper and lower extremities  Assessment & Plan:  Discussed with Dr. Ellwood Dense HTN: increased lasix to 40 bid, follow up cardiology DM2: decreased lantus to 45 units qhs, 8 units humalog in am, 10 units with lunch and dinner

## 2013-04-08 NOTE — Assessment & Plan Note (Signed)
BP Readings from Last 3 Encounters:  04/08/13 151/81  02/19/13 149/90  01/30/13 148/90   Lab Results  Component Value Date   NA 144 01/22/2013   K 3.7 01/22/2013   CREATININE 0.88 01/22/2013   Assessment: Blood pressure control: mildly elevated Progress toward BP goal:  deteriorated Comments: claims to have medications from cardiology, however has had them refilled from both clinics.  Would be helpful to have BP managed in once place and will discuss with cardiology  Plan: Medications:  increase lasix to 40mg  bid until evaluated by cardiology, continue norvasc, lisinopril, and toprol-xl.  may consider adding hydralazine at this point if needed? but will defer to cardiology and discuss with them Educational resources provided: brochure Self management tools provided: home blood pressure logbook Other plans: appreciate cards input

## 2013-04-08 NOTE — Assessment & Plan Note (Signed)
Smoking cessation strongly advised.    -call [email or web address]

## 2013-04-08 NOTE — Patient Instructions (Addendum)
General Instructions:  Please follow up with your cardiologist office on 04/26/13 at 2pm  We have increased your lasix to 80mg  daily--please take 40mg  tablet twice a day  We have changed your insulin: please take Lantus 45 units at bedtime and then 8 units of novolog in the morning and keep taking 10 units at lunch time and 10 units with dinner.    PLEASE DO NOT SKIP MEALS AND ALWAYS CHECK YOUR SUGAR BEFORE MEALS AND KEEP TRACK OF YOUR SUGARS.  Bring your meter to clinic.  You need to meet with Chalice plyler again.  If you start noticing more sweating, or chills, or not feeling well, nausea or vomiting after insulin, call us right away and let us know.  If your sugar is <70, DO NOT TAKE INSULIN at that time and call the clinic.  We reviewed how to bring your sugar up if it gets low.  PLEASE STOP SMOKING! Call 1800quitnow for free assistance  Get your colonoscopy and eye exam done  It was nice seeing you again, take care of yourself, walk more as tolerated, take your medicine and lets try to work on weight loss a little as well  Treatment Goals:  Goals (1 Years of Data) as of 04/08/13         02/19/13 01/22/13 11/08/10     Lifestyle    . Quit smoking / using tobacco  No       Result Component    . LDL CALC < 100   111 93      Progress Toward Treatment Goals:  Treatment Goal 04/08/2013  Hemoglobin A1C improved  Blood pressure deteriorated  Stop smoking smoking the same amount  Prevent falls at goal    Self Care Goals & Plans:  Self Care Goal 04/08/2013  Manage my medications take my medicines as prescribed; bring my medications to every visit; refill my medications on time  Monitor my health check my feet daily  Eat healthy foods eat foods that are low in salt; eat smaller portions; eat baked foods instead of fried foods  Be physically active -  Stop smoking go to the [place] website (https://scott-booker.info/); cut down the number of cigarettes smoked    Home Blood Glucose Monitoring  04/08/2013  Check my blood sugar 3 times a day  When to check my blood sugar before meals     Care Management & Community Referrals:  Referral 01/30/2013  Referrals made for care management support diabetes educator; nutritionist  Referrals made to community resources smoking cessation; nutrition

## 2013-04-09 LAB — MICROALBUMIN / CREATININE URINE RATIO
Creatinine, Urine: 109.9 mg/dL
Microalb Creat Ratio: 27.4 mg/g (ref 0.0–30.0)
Microalb, Ur: 3.01 mg/dL — ABNORMAL HIGH (ref 0.00–1.89)

## 2013-04-10 NOTE — Progress Notes (Signed)
Case discussed with Dr. Qureshi soon after the resident saw the patient.  We reviewed the resident's history and exam and pertinent patient test results.  I agree with the assessment, diagnosis, and plan of care documented in the resident's note. 

## 2013-04-11 ENCOUNTER — Other Ambulatory Visit: Payer: Self-pay

## 2013-04-12 ENCOUNTER — Ambulatory Visit (HOSPITAL_COMMUNITY)
Admission: RE | Admit: 2013-04-12 | Discharge: 2013-04-12 | Disposition: A | Discharge status: Home / Self Care | Payer: PRIVATE HEALTH INSURANCE | Source: Ambulatory Visit | Attending: Gastroenterology | Admitting: Gastroenterology

## 2013-04-12 ENCOUNTER — Encounter (HOSPITAL_COMMUNITY)
Admission: RE | Disposition: A | Discharge status: Home / Self Care | Payer: Self-pay | Source: Ambulatory Visit | Attending: Gastroenterology

## 2013-04-12 ENCOUNTER — Encounter (HOSPITAL_COMMUNITY): Payer: Self-pay | Admitting: *Deleted

## 2013-04-12 DIAGNOSIS — J449 Chronic obstructive pulmonary disease, unspecified: Secondary | ICD-10-CM | POA: Insufficient documentation

## 2013-04-12 DIAGNOSIS — I509 Heart failure, unspecified: Secondary | ICD-10-CM | POA: Insufficient documentation

## 2013-04-12 DIAGNOSIS — Z1211 Encounter for screening for malignant neoplasm of colon: Secondary | ICD-10-CM | POA: Insufficient documentation

## 2013-04-12 DIAGNOSIS — J4489 Other specified chronic obstructive pulmonary disease: Secondary | ICD-10-CM | POA: Insufficient documentation

## 2013-04-12 DIAGNOSIS — Z95810 Presence of automatic (implantable) cardiac defibrillator: Secondary | ICD-10-CM | POA: Insufficient documentation

## 2013-04-12 DIAGNOSIS — K644 Residual hemorrhoidal skin tags: Secondary | ICD-10-CM | POA: Insufficient documentation

## 2013-04-12 DIAGNOSIS — D128 Benign neoplasm of rectum: Secondary | ICD-10-CM | POA: Insufficient documentation

## 2013-04-12 DIAGNOSIS — I5042 Chronic combined systolic (congestive) and diastolic (congestive) heart failure: Secondary | ICD-10-CM | POA: Insufficient documentation

## 2013-04-12 DIAGNOSIS — E119 Type 2 diabetes mellitus without complications: Secondary | ICD-10-CM | POA: Insufficient documentation

## 2013-04-12 DIAGNOSIS — K635 Polyp of colon: Secondary | ICD-10-CM

## 2013-04-12 DIAGNOSIS — E785 Hyperlipidemia, unspecified: Secondary | ICD-10-CM | POA: Insufficient documentation

## 2013-04-12 DIAGNOSIS — I10 Essential (primary) hypertension: Secondary | ICD-10-CM | POA: Insufficient documentation

## 2013-04-12 DIAGNOSIS — E0590 Thyrotoxicosis, unspecified without thyrotoxic crisis or storm: Secondary | ICD-10-CM | POA: Insufficient documentation

## 2013-04-12 DIAGNOSIS — G4730 Sleep apnea, unspecified: Secondary | ICD-10-CM | POA: Insufficient documentation

## 2013-04-12 HISTORY — DX: Polyp of colon: K63.5

## 2013-04-12 HISTORY — DX: Presence of automatic (implantable) cardiac defibrillator: Z95.810

## 2013-04-12 HISTORY — PX: COLONOSCOPY: SHX5424

## 2013-04-12 LAB — GLUCOSE, CAPILLARY: Glucose-Capillary: 79 mg/dL (ref 70–99)

## 2013-04-12 SURGERY — COLONOSCOPY
Anesthesia: Moderate Sedation

## 2013-04-12 MED ORDER — FENTANYL CITRATE 0.05 MG/ML IJ SOLN
INTRAMUSCULAR | Status: AC
  Filled 2013-04-12: qty 4

## 2013-04-12 MED ORDER — MIDAZOLAM HCL 10 MG/2ML IJ SOLN
INTRAMUSCULAR | Status: DC | PRN
  Administered 2013-04-12 (×2): 2 mg via INTRAVENOUS

## 2013-04-12 MED ORDER — FENTANYL CITRATE 0.05 MG/ML IJ SOLN
INTRAMUSCULAR | Status: DC | PRN
  Administered 2013-04-12 (×2): 25 ug via INTRAVENOUS

## 2013-04-12 MED ORDER — MIDAZOLAM HCL 10 MG/2ML IJ SOLN
INTRAMUSCULAR | Status: AC
  Filled 2013-04-12: qty 4

## 2013-04-12 MED ORDER — SODIUM CHLORIDE 0.9 % IV SOLN
INTRAVENOUS | Status: DC
  Administered 2013-04-12: 500 mL via INTRAVENOUS

## 2013-04-12 MED ORDER — DIPHENHYDRAMINE HCL 50 MG/ML IJ SOLN
INTRAMUSCULAR | Status: AC
  Filled 2013-04-12: qty 1

## 2013-04-12 NOTE — Op Note (Addendum)
[facility] [place], [id number]   OPERATIVE PROCEDURE REPORT  PATIENT: Peggy, [name]  MR#: [id number] BIRTHDATE: [birth year]  GENDER: Female ENDOSCOPIST: Carol Ada, MD ASSISTANT:   Rickard Rhymes, RN Cristopher Estimable, technician PROCEDURE DATE: 04/12/2013 PROCEDURE:   Colonoscopy with snare polypectomy ASA CLASS:   Class III INDICATIONS:Colorectal cancer screening. MEDICATIONS: Versed 4 mg IV and Fentanyl 50 mcg IV  DESCRIPTION OF PROCEDURE:   After the risks benefits and alternatives of the procedure were thoroughly explained, informed consent was obtained.  A digital rectal exam revealed no abnormalities of the rectum.    The Pentax Ped Colon [id number] endoscope was introduced through the anus  and advanced to the cecum, which was identified by both the appendix and ileocecal valve , No adverse events experienced.    The quality of the prep was excellent. .  The instrument was then slowly withdrawn as the colon was fully examined.     FINDINGS: A 3 mm sessile rectosigmoid polyp was removed with a cold snare.  No evidence of any inflammation, ulcerations, erosion, masses, or vascular abnormalities.   Retroflexed views revealed internal/external hemorrhoids.     The scope was then withdrawn from the patient and the procedure terminated.  Withdrawal time 14 minutes.  COMPLICATIONS: There were no complications.  IMPRESSION: 1) Polyp. 2) Hemorrhoids.  RECOMMENDATIONS: 1.  Await biopsy results 2.  Repeat Colonscopy in 5-10 years.   _______________________________ eSignedCarol Ada, MD 04/12/2013 10:50 AM Revised: 04/12/2013 10:50 AM

## 2013-04-12 NOTE — H&P (Signed)
Reason for Consult: Screening colonoscopy Referring Physician: Verneda Skill, M.D.  Kirby Funk HPI: This is a 62 year old female referred for a screening colonoscopy.  The patient denies any issues with constipation, diarrhea, hematochezia, melena, GERD, or dysphagia.  There is no known family history of colon cancer.  She has a defibrillator, but no recent issues from a cardiac standpoint.  Past Medical History  Diagnosis Date  . Hyperthyroidism, subclinical   . Post menopausal syndrome   . Obesity   . Cardiac defibrillator in situ     Atlas II VR (SJM) implanted by Dr Lovena Le  . Eczema   . Lipoma   . Chronic ulcer of leg   . Hyperlipidemia   . Chronic combined systolic and diastolic heart failure     a. EF 35-40% in past;  b. Echo 7/13:  EF 45-50%, Gr 2 diast dysfn, mild AI, mild MAC, trivial MR, mild LAE, PASP 47.  [name] NICM (nonischemic cardiomyopathy)   . Diabetes mellitus   . HTN (hypertension)   . Elevated alkaline phosphatase level     GGT and 5'nucleotidase 8/13 normal  . Hx of cardiac cath     a. [place] 2003 normal;  b. LHC 6/13:  Mild calcification in the LM, o/w normal coronary arteries, EF 45%.   . Sleep apnea     pt denies 04/12/2013  . COPD (chronic obstructive pulmonary disease)     O2 at night  . Automatic implantable cardioverter-defibrillator in situ   . CHF (congestive heart failure)   . Depression     Past Surgical History  Procedure Laterality Date  . Cardiac defibrillator placement  05/04/2007    SJM Atlas II VR ICD  . Hysteroscopy    . Cardiac defibrillator placement    . Abdominal hysterectomy    . Cardiac catheterization    . Insert / replace / remove pacemaker    . Tubal ligation    . Hernia repair      Family History  Problem Relation Age of Onset  . Stroke Mother   . Seizures Father   . Diabetes Sister   . Asthma Maternal Aunt     aunts  . Asthma Maternal Uncle     uncles  . Heart disease Father   . Heart disease Paternal Aunt    aunts  . Heart disease Paternal Uncle     uncles  . Heart disease Maternal Aunt     aunts  . Heart disease Maternal Uncle     uncles  . Heart disease Maternal Grandfather     Social History:  reports that she has been smoking Cigarettes.  She has a 8.75 pack-year smoking history. She does not have any smokeless tobacco history on file. She reports that she does not drink alcohol or use illicit drugs.  Allergies:  Allergies  Allergen Reactions  . Avandia (Rosiglitazone Maleate)     unknown  . Hydrocortisone     unknown  . Pioglitazone     REACTION: congestive heart failure ACTOS    Medications:  Scheduled:  Continuous: . sodium chloride 500 mL (04/12/13 0909)    Results for orders placed during the hospital encounter of 04/12/13 (from the past 24 hour(s))  GLUCOSE, CAPILLARY     Status: None   Collection Time    04/12/13  9:04 AM      Result Value Range   Glucose-Capillary 79  70 - 99 mg/dL     No results found.  ROS:  As stated above in the HPI otherwise negative.  Blood pressure 141/87, temperature 97.9 F (36.6 C), temperature source Oral, resp. rate 20, height 5' (1.524 m), weight 245 lb (111.131 kg), SpO2 94.00%.    PE: Gen: NAD, Alert and Oriented HEENT:  [place]/AT, EOMI Neck: Supple, no LAD Lungs: CTA Bilaterally CV: RRR without M/G/R ABM: Soft, NTND, +BS Ext: No C/C/E  Assessment/Plan: 1) Screening colonoscopy today.  Syreeta Figler D 04/12/2013, 9:14 AM

## 2013-04-15 ENCOUNTER — Encounter (HOSPITAL_COMMUNITY): Payer: Self-pay | Admitting: Gastroenterology

## 2013-04-25 ENCOUNTER — Encounter: Payer: Self-pay | Admitting: Internal Medicine

## 2013-04-26 ENCOUNTER — Ambulatory Visit: Payer: Self-pay | Admitting: Nurse Practitioner

## 2013-05-13 ENCOUNTER — Encounter: Payer: Self-pay | Admitting: Nurse Practitioner

## 2013-05-13 ENCOUNTER — Ambulatory Visit (INDEPENDENT_AMBULATORY_CARE_PROVIDER_SITE_OTHER): Payer: PRIVATE HEALTH INSURANCE | Admitting: Nurse Practitioner

## 2013-05-13 VITALS — BP 170/90 | HR 96 | Ht 60.0 in | Wt 240.4 lb

## 2013-05-13 DIAGNOSIS — I5042 Chronic combined systolic (congestive) and diastolic (congestive) heart failure: Secondary | ICD-10-CM

## 2013-05-13 NOTE — Patient Instructions (Addendum)
Try to not use any NSAID - Advil, Alleve, Ibuprofen, naproxen, etc.  See Dr. Stanford Breed in a month - take your medicines on the day of the appointment  Call the [place] office at [phone] if you have any questions, problems or concerns.

## 2013-05-13 NOTE — Progress Notes (Signed)
Kirby Funk Date of Birth: 1951-07-19 Medical Record [id number]  History of Present Illness: [name] is seen back today for a work in visit. She is seen for Dr. Stanford Breed. She has a history of combined chronic systolic and diastolic HF with NICM - EF of 35 to 40%. Other issues include HTN, HLD, DM2, underlying ICD. Last Myoview was in 2008 - no ischemia and EF 33%. LHC back in June of 2013 with mild calcification in the left main and otherwise normal coronaries and EF 45%. Last echo in July of 2013 with EF 45 to [id number], grade 2 diastolic dysfunction, mild AI, mild MAC, trival MR, mild LAE, PASP of 47.   Last seen here back in April. Felt to be doing ok except for her smoking.   Seen by her PCP last month - Reported lots of stress with her family. More swelling but did report increased salt use. Diuretics were increased.   Comes back today. Here alone. Buried her husband this past Saturday - son in law passed a month ago. She thinks she is doing basically ok. Not really sure why she is here - says it is for her blood pressure - tells me that it wasn't really for the swelling and that she does not like salt. History is a little hard to follow. Regardless - she says she is doing ok. No chest pain. Says her breathing is ok. Not dizzy or lightheaded. Has had NONE of her medicines today.   Current Outpatient Prescriptions  Medication Sig Dispense Refill  . albuterol (VENTOLIN HFA) 108 (90 BASE) MCG/ACT inhaler Inhale 2 puffs into the lungs every 6 (six) hours as needed. Shortness of breath  1 Inhaler  6  . amLODipine (NORVASC) 10 MG tablet Take 1 tablet (10 mg total) by mouth daily.  30 tablet  11  . aspirin 81 MG tablet Take 1 tablet (81 mg total) by mouth daily.  30 tablet  11  . atorvastatin (LIPITOR) 80 MG tablet Take 1 tablet (80 mg total) by mouth at bedtime.  30 tablet  11  . citalopram (CELEXA) 20 MG tablet Take 1 tablet (20 mg total) by mouth daily.  30 tablet  11  . Fluticasone-Salmeterol  (ADVAIR) 250-50 MCG/DOSE AEPB Inhale 1 puff into the lungs every 12 (twelve) hours.  60 each  11  . furosemide (LASIX) 40 MG tablet Take 1 tablet (40 mg total) by mouth 2 (two) times daily.  45 tablet  11  . gabapentin (NEURONTIN) 300 MG capsule Take 1 capsule (300 mg total) by mouth at bedtime.  30 capsule  11  . glucose blood (ONE TOUCH ULTRA TEST) test strip 1 each by Other route 3 (three) times daily. Use as instructed by doctor up to 3x daily,dx code 250.02 insulin requiring      . insulin glargine (LANTUS) 100 UNIT/ML injection Inject 0.45 mLs (45 Units total) into the skin daily with breakfast.  10 mL  3  . insulin lispro (HUMALOG) 100 UNIT/ML injection Inject 8 units with breakfast and 10 units with lunch and dinner  10 mL  5  . ketoconazole (NIZORAL) 2 % shampoo Apply 1 application topically 3 (three) times a week.  120 mL  3  . lisinopril (PRINIVIL,ZESTRIL) 40 MG tablet Take 1 tablet (40 mg total) by mouth daily.  30 tablet  11  . metoprolol (TOPROL-XL) 200 MG 24 hr tablet Take 1 tablet (200 mg total) by mouth daily.  30 tablet  11  .  nitroGLYCERIN (NITROSTAT) 0.4 MG SL tablet Place 1 tablet (0.4 mg total) under the tongue every 5 (five) minutes as needed for chest pain.  25 tablet  3  . spironolactone (ALDACTONE) 25 MG tablet Take 1 tablet (25 mg total) by mouth daily.  30 tablet  11  . triamcinolone (KENALOG) 0.1 % ointment Apply 1 application topically as needed. rash       No current facility-administered medications for this visit.    Allergies  Allergen Reactions  . Avandia (Rosiglitazone Maleate)     unknown  . Hydrocortisone     unknown  . Pioglitazone     REACTION: congestive heart failure ACTOS    Past Medical History  Diagnosis Date  . Hyperthyroidism, subclinical   . Post menopausal syndrome   . Obesity   . Cardiac defibrillator in situ     Atlas II VR (SJM) implanted by Dr Lovena Le  . Eczema   . Lipoma   . Chronic ulcer of leg   . Hyperlipidemia   . Chronic  combined systolic and diastolic heart failure     a. EF 35-40% in past;  b. Echo 7/13:  EF 45-50%, Gr 2 diast dysfn, mild AI, mild MAC, trivial MR, mild LAE, PASP 47.  [name] NICM (nonischemic cardiomyopathy)   . Diabetes mellitus   . HTN (hypertension)   . Elevated alkaline phosphatase level     GGT and 5'nucleotidase 8/13 normal  . Hx of cardiac cath     a. [place] 2003 normal;  b. LHC 6/13:  Mild calcification in the LM, o/w normal coronary arteries, EF 45%.   . Sleep apnea     pt denies 04/12/2013  . COPD (chronic obstructive pulmonary disease)     O2 at night  . Automatic implantable cardioverter-defibrillator in situ   . CHF (congestive heart failure)   . Depression     Past Surgical History  Procedure Laterality Date  . Cardiac defibrillator placement  05/04/2007    SJM Atlas II VR ICD  . Hysteroscopy    . Cardiac defibrillator placement    . Abdominal hysterectomy    . Cardiac catheterization    . Insert / replace / remove pacemaker    . Tubal ligation    . Hernia repair    . Colonoscopy N/A 04/12/2013    Procedure: COLONOSCOPY;  Surgeon: Beryle Beams, MD;  Location: [facility];  Service: Endoscopy;  Laterality: N/A;  pt.has defibrilator    History  Smoking status  . Light Tobacco Smoker -- 0.25 packs/day for 35 years  . Types: Cigarettes  Smokeless tobacco  . Not on file    Comment: currently smoking 1-2 cigs daily.     History  Alcohol Use No    Comment: Quit 05/2007; occasional 04/12/2013    Family History  Problem Relation Age of Onset  . Stroke Mother   . Seizures Father   . Diabetes Sister   . Asthma Maternal Aunt     aunts  . Asthma Maternal Uncle     uncles  . Heart disease Father   . Heart disease Paternal Aunt     aunts  . Heart disease Paternal Uncle     uncles  . Heart disease Maternal Aunt     aunts  . Heart disease Maternal Uncle     uncles  . Heart disease Maternal Grandfather     Review of Systems: The review of systems is per the  HPI.  All other systems were  reviewed and are negative.  Physical Exam: BP 170/90  Pulse 96  Ht 5' (1.524 m)  Wt 240 lb 6.4 oz (109.045 kg)  BMI 46.95 kg/m2 Patient is very pleasant and in no acute distress. She is obese. Weight is down 6 pounds. Skin is warm and dry. Color is normal.  HEENT is unremarkable. Normocephalic/atraumatic. PERRL. Sclera are nonicteric. Neck is supple. No masses. No JVD. Lungs are clear. Cardiac exam shows a regular rate and rhythm. Rate is flat. Abdomen is soft. Extremities are full but without significant edema. Gait and ROM are intact. No gross neurologic deficits noted.  LABORATORY DATA: Lab Results  Component Value Date   WBC 12.3* 08/31/2012   HGB 13.8 08/31/2012   HCT 42.6 08/31/2012   PLT 259 08/31/2012   GLUCOSE 283* 01/22/2013   CHOL 201* 01/22/2013   TRIG 113 01/22/2013   HDL 67 01/22/2013   LDLDIRECT 122.3 03/06/2007   LDLCALC 111* 01/22/2013   ALT 17 09/19/2012   AST 18 09/19/2012   NA 144 01/22/2013   K 3.7 01/22/2013   CL 97 01/22/2013   CREATININE 0.88 01/22/2013   BUN 17 01/22/2013   CO2 31 01/22/2013   TSH 0.644 04/02/2012   INR 1.18 04/02/2012   HGBA1C 9.3 04/08/2013   MICROALBUR 3.01* 04/08/2013     Assessment / Plan: 1. HTN - no medicines taken today - on multiple medicines. Sort of hard to be able to discern if she is on adequate therapy when she has not taken.   2. Combined systolic and diastolic heart failure - she seems compensated to me and denies any CHF symptoms at this time.   She is due to see Dr. Stanford Breed for her regular visit in October - will get that scheduled today - I have asked her to take her medicines prior to her visit. For now, no change in therapy.    Patient is agreeable to this plan and will call if any problems develop in the interim.   Burtis Junes, RN, ANP-C [facility] [address] [place], [place]  [address]

## 2013-05-20 ENCOUNTER — Other Ambulatory Visit: Payer: Self-pay | Admitting: *Deleted

## 2013-05-20 MED ORDER — GLUCOSE BLOOD VI STRP: ORAL_STRIP | Status: DC

## 2013-05-20 NOTE — Telephone Encounter (Signed)
Pt is out of strips

## 2013-05-29 ENCOUNTER — Ambulatory Visit (INDEPENDENT_AMBULATORY_CARE_PROVIDER_SITE_OTHER): Payer: PRIVATE HEALTH INSURANCE | Admitting: *Deleted

## 2013-05-29 DIAGNOSIS — I5042 Chronic combined systolic (congestive) and diastolic (congestive) heart failure: Secondary | ICD-10-CM

## 2013-05-29 DIAGNOSIS — I428 Other cardiomyopathies: Secondary | ICD-10-CM

## 2013-05-29 LAB — ICD DEVICE OBSERVATION
BATTERY VOLTAGE: 2.5 V
BRDY-0002RV: 40 {beats}/min
DEVICE MODEL ICD: 436280
RV LEAD AMPLITUDE: 12 mv
RV LEAD IMPEDENCE ICD: 485 Ohm
TZAT-0001SLOWVT: 1
TZAT-0004SLOWVT: 8
TZAT-0012SLOWVT: 200 ms
TZAT-0013SLOWVT: 1
TZAT-0018SLOWVT: NEGATIVE
TZAT-0019SLOWVT: 7.5 V
TZAT-0020SLOWVT: 1 ms
TZON-0003SLOWVT: 300 ms
TZON-0004SLOWVT: 16
TZON-0005SLOWVT: 6
TZST-0001SLOWVT: 2
TZST-0001SLOWVT: 3
TZST-0001SLOWVT: 4
TZST-0001SLOWVT: 5
TZST-0003SLOWVT: 25 J
TZST-0003SLOWVT: 36 J
TZST-0003SLOWVT: 36 J
TZST-0003SLOWVT: 36 J
VENTRICULAR PACING ICD: 1 pct

## 2013-05-29 NOTE — Progress Notes (Signed)
ICD interrogation only for battery data in office.

## 2013-06-05 ENCOUNTER — Encounter: Payer: Self-pay | Admitting: Internal Medicine

## 2013-06-28 ENCOUNTER — Ambulatory Visit (INDEPENDENT_AMBULATORY_CARE_PROVIDER_SITE_OTHER): Payer: PRIVATE HEALTH INSURANCE | Admitting: *Deleted

## 2013-06-28 DIAGNOSIS — Z23 Encounter for immunization: Secondary | ICD-10-CM

## 2013-07-02 ENCOUNTER — Encounter: Payer: Self-pay | Admitting: Internal Medicine

## 2013-07-05 ENCOUNTER — Encounter: Payer: Self-pay | Admitting: Cardiology

## 2013-07-05 ENCOUNTER — Ambulatory Visit (INDEPENDENT_AMBULATORY_CARE_PROVIDER_SITE_OTHER): Payer: PRIVATE HEALTH INSURANCE | Admitting: Cardiology

## 2013-07-05 VITALS — BP 138/82 | HR 80 | Ht 60.0 in | Wt 235.0 lb

## 2013-07-05 DIAGNOSIS — E785 Hyperlipidemia, unspecified: Secondary | ICD-10-CM

## 2013-07-05 LAB — BASIC METABOLIC PANEL
BUN: 10 mg/dL (ref 6–23)
CO2: 29 mEq/L (ref 19–32)
Calcium: 9.3 mg/dL (ref 8.4–10.5)
Chloride: 106 mEq/L (ref 96–112)
Creatinine, Ser: 1 mg/dL (ref 0.4–1.2)
GFR: 75.4 mL/min (ref >60.00)
Glucose, Bld: 266 mg/dL — ABNORMAL HIGH (ref 70–99)
Potassium: 4.2 mEq/L (ref 3.5–5.1)
Sodium: 147 mEq/L — ABNORMAL HIGH (ref 135–145)

## 2013-07-05 LAB — HEPATIC FUNCTION PANEL
ALT: 22 U/L (ref 0–35)
AST: 23 U/L (ref 0–37)
Albumin: 4.1 g/dL (ref 3.5–5.2)
Alkaline Phosphatase: 123 U/L — ABNORMAL HIGH (ref 39–117)
Bilirubin, Direct: 0.2 mg/dL (ref 0.0–0.3)
Total Bilirubin: 0.6 mg/dL (ref 0.3–1.2)
Total Protein: 8 g/dL (ref 6.0–8.3)

## 2013-07-05 LAB — LIPID PANEL
Cholesterol: 173 mg/dL (ref 0–200)
HDL: 63 mg/dL (ref >39.00)
LDL Cholesterol: 92 mg/dL (ref 0–99)
Total CHOL/HDL Ratio: 3
Triglycerides: 88 mg/dL (ref 0.0–149.0)
VLDL: 17.6 mg/dL (ref 0.0–40.0)

## 2013-07-05 NOTE — Assessment & Plan Note (Signed)
Followed by electrophysiology. 

## 2013-07-05 NOTE — Assessment & Plan Note (Signed)
Continue statin. Check lipids and liver. 

## 2013-07-05 NOTE — Progress Notes (Signed)
HPI: Pleasant female for fu of chronic combined systolic and diastolic CHF. She has a hx of NICM with a pror EF of 35-40%, HTN, HL, DM2, s/p AICD implantation. Myoview 2008: no ischemia, EF 33%. LHC 6/13: Mild calcification in the LM, o/w normal coronary arteries, EF 45%. Echo 7/13: EF 45-50%, Gr 2 diast dysfn, mild AI, mild MAC, trivial MR, mild LAE, PASP 47. Since I last saw her, she has mild dyspnea on exertion but no orthopnea, PND, palpitations, chest pain or syncope. Occasional mild pedal edema.   Current Outpatient Prescriptions  Medication Sig Dispense Refill  . albuterol (VENTOLIN HFA) 108 (90 BASE) MCG/ACT inhaler Inhale 2 puffs into the lungs every 6 (six) hours as needed. Shortness of breath  1 Inhaler  6  . amLODipine (NORVASC) 10 MG tablet Take 1 tablet (10 mg total) by mouth daily.  30 tablet  11  . aspirin 81 MG tablet Take 1 tablet (81 mg total) by mouth daily.  30 tablet  11  . atorvastatin (LIPITOR) 80 MG tablet Take 1 tablet (80 mg total) by mouth at bedtime.  30 tablet  11  . citalopram (CELEXA) 20 MG tablet Take 1 tablet (20 mg total) by mouth daily.  30 tablet  11  . Fluticasone-Salmeterol (ADVAIR) 250-50 MCG/DOSE AEPB Inhale 1 puff into the lungs every 12 (twelve) hours.  60 each  11  . furosemide (LASIX) 40 MG tablet Take 1 tablet (40 mg total) by mouth 2 (two) times daily.  45 tablet  11  . gabapentin (NEURONTIN) 300 MG capsule Take 1 capsule (300 mg total) by mouth at bedtime.  30 capsule  11  . glucose blood (ONE TOUCH ULTRA TEST) test strip Use as instructed by doctor up to 3x daily,dx code 250.02 insulin requiring  100 each  5  . insulin glargine (LANTUS) 100 UNIT/ML injection Inject 44 Units into the skin daily with breakfast.      . insulin lispro (HUMALOG) 100 UNIT/ML injection Inject 8 units with breakfast and 10 units with lunch and dinner  10 mL  5  . ketoconazole (NIZORAL) 2 % shampoo Apply 1 application topically 3 (three) times a week.  120 mL  3  .  lisinopril (PRINIVIL,ZESTRIL) 40 MG tablet Take 1 tablet (40 mg total) by mouth daily.  30 tablet  11  . metoprolol (TOPROL-XL) 200 MG 24 hr tablet Take 1 tablet (200 mg total) by mouth daily.  30 tablet  11  . nitroGLYCERIN (NITROSTAT) 0.4 MG SL tablet Place 1 tablet (0.4 mg total) under the tongue every 5 (five) minutes as needed for chest pain.  25 tablet  3  . spironolactone (ALDACTONE) 25 MG tablet Take 1 tablet (25 mg total) by mouth daily.  30 tablet  11  . triamcinolone (KENALOG) 0.1 % ointment Apply 1 application topically as needed. rash       No current facility-administered medications for this visit.     Past Medical History  Diagnosis Date  . Hyperthyroidism, subclinical   . Post menopausal syndrome   . Obesity   . Cardiac defibrillator in situ     Atlas II VR (SJM) implanted by Dr Lovena Le  . Eczema   . Lipoma   . Chronic ulcer of leg   . Hyperlipidemia   . Chronic combined systolic and diastolic heart failure     a. EF 35-40% in past;  b. Echo 7/13:  EF 45-50%, Gr 2 diast dysfn, mild AI, mild  MAC, trivial MR, mild LAE, PASP 47.  [name] NICM (nonischemic cardiomyopathy)   . Diabetes mellitus   . HTN (hypertension)   . Elevated alkaline phosphatase level     GGT and 5'nucleotidase 8/13 normal  . Hx of cardiac cath     a. [place] 2003 normal;  b. LHC 6/13:  Mild calcification in the LM, o/w normal coronary arteries, EF 45%.   . Sleep apnea     pt denies 04/12/2013  . COPD (chronic obstructive pulmonary disease)     O2 at night  . Automatic implantable cardioverter-defibrillator in situ   . CHF (congestive heart failure)   . Depression     Past Surgical History  Procedure Laterality Date  . Cardiac defibrillator placement  05/04/2007    SJM Atlas II VR ICD  . Hysteroscopy    . Cardiac defibrillator placement    . Abdominal hysterectomy    . Cardiac catheterization    . Insert / replace / remove pacemaker    . Tubal ligation    . Hernia repair    . Colonoscopy N/A  04/12/2013    Procedure: COLONOSCOPY;  Surgeon: Beryle Beams, MD;  Location: [facility];  Service: Endoscopy;  Laterality: N/A;  pt.has defibrilator    History   Social History  . Marital Status: Married    Spouse Name: Alroy Dust    Number of Children: N/A  . Years of Education: N/A   Occupational History  . disabled    Social History Main Topics  . Smoking status: Light Tobacco Smoker -- 0.25 packs/day for 35 years    Types: Cigarettes  . Smokeless tobacco: Not on file     Comment: currently smoking 1-2 cigs daily.   . Alcohol Use: No     Comment: Quit 05/2007; occasional 04/12/2013  . Drug Use: No  . Sexual Activity: Not Currently   Other Topics Concern  . Not on file   Social History Narrative   ** Merged History Encounter **       Married    ROS: no fevers or chills, productive cough, hemoptysis, dysphasia, odynophagia, melena, hematochezia, dysuria, hematuria, rash, seizure activity, orthopnea, PND, pedal edema, claudication. Remaining systems are negative.  Physical Exam: Well-developed obese in no acute distress.  Skin is warm and dry.  HEENT is normal.  Neck is supple.  Chest is clear to auscultation with normal expansion.  Cardiovascular exam is regular rate and rhythm.  Abdominal exam nontender or distended. No masses palpated. Extremities show trace edema. neuro grossly intact  ECG  Sinus rhythm, lateral T wave inversion, prolonged QT.

## 2013-07-05 NOTE — Assessment & Plan Note (Signed)
Patient counseled on discontinuing. 

## 2013-07-05 NOTE — Assessment & Plan Note (Signed)
Patient is euvolemic on examination. Continue present dose of diuretics. Check potassium and renal function.

## 2013-07-05 NOTE — Patient Instructions (Addendum)
Your physician wants you to follow-up in: [place] DR. CRENSHAW  You will receive a reminder letter in the mail two months in advance. If you don't receive a letter, please call our office to schedule the follow-up appointment.   Your physician recommends that you return for lab work in: [email or web address], [email or web address]  Your physician recommends that you continue on your current medications as directed. Please refer to the Current Medication list given to you today.

## 2013-07-05 NOTE — Assessment & Plan Note (Signed)
Blood pressure controlled. Continue present medications. 

## 2013-07-05 NOTE — Assessment & Plan Note (Signed)
Continue beta blocker and ACE inhibitor. 

## 2013-07-09 ENCOUNTER — Encounter: Payer: Self-pay | Admitting: Internal Medicine

## 2013-07-09 ENCOUNTER — Ambulatory Visit (INDEPENDENT_AMBULATORY_CARE_PROVIDER_SITE_OTHER): Payer: PRIVATE HEALTH INSURANCE | Admitting: Internal Medicine

## 2013-07-09 VITALS — BP 131/79 | HR 80 | Temp 98.0°F | Ht 61.0 in | Wt 235.6 lb

## 2013-07-09 DIAGNOSIS — I10 Essential (primary) hypertension: Secondary | ICD-10-CM

## 2013-07-09 DIAGNOSIS — I5042 Chronic combined systolic (congestive) and diastolic (congestive) heart failure: Secondary | ICD-10-CM

## 2013-07-09 DIAGNOSIS — J449 Chronic obstructive pulmonary disease, unspecified: Secondary | ICD-10-CM

## 2013-07-09 DIAGNOSIS — F17200 Nicotine dependence, unspecified, uncomplicated: Secondary | ICD-10-CM

## 2013-07-09 DIAGNOSIS — E669 Obesity, unspecified: Secondary | ICD-10-CM

## 2013-07-09 DIAGNOSIS — Z1239 Encounter for other screening for malignant neoplasm of breast: Secondary | ICD-10-CM

## 2013-07-09 DIAGNOSIS — Z23 Encounter for immunization: Secondary | ICD-10-CM

## 2013-07-09 DIAGNOSIS — E119 Type 2 diabetes mellitus without complications: Secondary | ICD-10-CM

## 2013-07-09 LAB — GLUCOSE, CAPILLARY: Glucose-Capillary: 152 mg/dL — ABNORMAL HIGH (ref 70–99)

## 2013-07-09 LAB — POCT GLYCOSYLATED HEMOGLOBIN (HGB A1C): Hemoglobin A1C: 10.3

## 2013-07-09 MED ORDER — ALBUTEROL SULFATE HFA 108 (90 BASE) MCG/ACT IN AERS: 2.0000 | INHALATION_SPRAY | Freq: Four times a day (QID) | RESPIRATORY_TRACT | Status: DC | PRN

## 2013-07-09 MED ORDER — INSULIN LISPRO 100 UNIT/ML ~~LOC~~ SOLN: SUBCUTANEOUS | Status: DC

## 2013-07-09 NOTE — Progress Notes (Signed)
Subjective:   Patient ID: [name] female   DOB: 04-10-1951 62 y.o.   MRN: [id number]  HPI: [name] is a 62 y.o. African American female with PMH of combined systolic and diastolic CHF EF [id number] grade 2 diastolic dysfunction, NICM, DM, HLD, ICD, and hyperthyroidism presenting to [facility] today for routine follow up visit. She was recently seen by Dr. Stanford Breed on 07/05/13.  She also had her colonoscopy done in July 2014 and pathology was benign with no dysplasia or malignancy.  She continues to smoke cigarettes but is down to 1-2 cigarettes daily. She is down 5 pounds since 06/12/2013 weighing 235lbs today.   Her husband unfortunately passed away in 2023-06-13 and she says she has been doing as well as expected since his passing. She says she has good family support.    Past Medical History  Diagnosis Date  . Hyperthyroidism, subclinical   . Post menopausal syndrome   . Obesity   . Cardiac defibrillator in situ     Atlas II VR (SJM) implanted by Dr Lovena Le  . Eczema   . Lipoma   . Chronic ulcer of leg   . Hyperlipidemia   . Chronic combined systolic and diastolic heart failure     a. EF 35-40% in past;  b. Echo 7/13:  EF 45-50%, Gr 2 diast dysfn, mild AI, mild MAC, trivial MR, mild LAE, PASP 47.  [name] NICM (nonischemic cardiomyopathy)   . Diabetes mellitus   . HTN (hypertension)   . Elevated alkaline phosphatase level     GGT and 5'nucleotidase 8/13 normal  . Hx of cardiac cath     a. [place] 2003 normal;  b. [facility] 6/13:  Mild calcification in the LM, o/w normal coronary arteries, EF 45%.   . Sleep apnea     pt denies 04/12/2013  . COPD (chronic obstructive pulmonary disease)     O2 at night  . Automatic implantable cardioverter-defibrillator in situ   . CHF (congestive heart failure)   . Depression    Current Outpatient Prescriptions  Medication Sig Dispense Refill  . albuterol (VENTOLIN HFA) 108 (90 BASE) MCG/ACT inhaler Inhale 2 puffs into the lungs every 6 (six) hours as needed.  Shortness of breath  1 Inhaler  6  . amLODipine (NORVASC) 10 MG tablet Take 1 tablet (10 mg total) by mouth daily.  30 tablet  11  . aspirin 81 MG tablet Take 1 tablet (81 mg total) by mouth daily.  30 tablet  11  . atorvastatin (LIPITOR) 80 MG tablet Take 1 tablet (80 mg total) by mouth at bedtime.  30 tablet  11  . citalopram (CELEXA) 20 MG tablet Take 1 tablet (20 mg total) by mouth daily.  30 tablet  11  . Fluticasone-Salmeterol (ADVAIR) 250-50 MCG/DOSE AEPB Inhale 1 puff into the lungs every 12 (twelve) hours.  60 each  11  . furosemide (LASIX) 40 MG tablet Take 1 tablet (40 mg total) by mouth 2 (two) times daily.  45 tablet  11  . gabapentin (NEURONTIN) 300 MG capsule Take 1 capsule (300 mg total) by mouth at bedtime.  30 capsule  11  . glucose blood (ONE TOUCH ULTRA TEST) test strip Use as instructed by doctor up to 3x daily,dx code 250.02 insulin requiring  100 each  5  . insulin glargine (LANTUS) 100 UNIT/ML injection Inject 44 Units into the skin daily with breakfast.      . insulin lispro (HUMALOG) 100 UNIT/ML injection Inject  8 units with breakfast and 10 units with lunch and dinner  10 mL  5  . ketoconazole (NIZORAL) 2 % shampoo Apply 1 application topically 3 (three) times a week.  120 mL  3  . lisinopril (PRINIVIL,ZESTRIL) 40 MG tablet Take 1 tablet (40 mg total) by mouth daily.  30 tablet  11  . metoprolol (TOPROL-XL) 200 MG 24 hr tablet Take 1 tablet (200 mg total) by mouth daily.  30 tablet  11  . nitroGLYCERIN (NITROSTAT) 0.4 MG SL tablet Place 1 tablet (0.4 mg total) under the tongue every 5 (five) minutes as needed for chest pain.  25 tablet  3  . spironolactone (ALDACTONE) 25 MG tablet Take 1 tablet (25 mg total) by mouth daily.  30 tablet  11  . triamcinolone (KENALOG) 0.1 % ointment Apply 1 application topically as needed. rash       No current facility-administered medications for this visit.   Family History  Problem Relation Age of Onset  . Stroke Mother   . Seizures  Father   . Diabetes Sister   . Asthma Maternal Aunt     aunts  . Asthma Maternal Uncle     uncles  . Heart disease Father   . Heart disease Paternal Aunt     aunts  . Heart disease Paternal Uncle     uncles  . Heart disease Maternal Aunt     aunts  . Heart disease Maternal Uncle     uncles  . Heart disease Maternal Grandfather    History   Social History  . Marital Status: Married    Spouse Name: [name]    Number of Children: N/A  . Years of Education: N/A   Occupational History  . disabled    Social History Main Topics  . Smoking status: Light Tobacco Smoker -- 0.25 packs/day for 35 years    Types: Cigarettes  . Smokeless tobacco: Not on file     Comment: currently smoking 1-2 cigs daily.   . Alcohol Use: No     Comment: Quit 05/2007; occasional 04/12/2013  . Drug Use: No  . Sexual Activity: Not Currently   Other Topics Concern  . Not on file   Social History Narrative   ** Merged History Encounter **       Married   Review of Systems:  Constitutional:  Denies fever, chills, diaphoresis, appetite change and fatigue.   HEENT:  Denies congestion, sore throat, rhinorrhea, sneezing, mouth sores, trouble swallowing, neck pain   Respiratory:  DOE, occasional cough.  Denies SOB, and wheezing.   Cardiovascular:  Lower extremity edema. Denies chest pain, palpitations,.  Gastrointestinal:  Denies nausea, vomiting, abdominal pain, diarrhea, constipation, blood in stool and abdominal distention.   Genitourinary:  Denies dysuria, urgency, frequency, hematuria, flank pain and difficulty urinating.   Musculoskeletal:  Denies myalgias, back pain, joint swelling, arthralgias and gait problem.   Skin:  Denies pallor, rash and wound.   Neurological:  Denies dizziness, seizures, syncope, weakness, light-headedness, numbness and headaches.    Objective:  Physical Exam: Filed Vitals:   07/09/13 1434  Pulse: 80  Temp: 98 F (36.7 C)  TempSrc: Oral  Weight: 235 lb 9.6 oz  (106.867 kg)  SpO2: 93%   Vitals reviewed. General: sitting in chair, NAD HEENT: PERRL, EOMI, no scleral icterus Cardiac: RRR, no rubs, murmurs or gallops Pulm: clear to auscultation bilaterally, no wheezes, rales, or rhonchi Abd: soft, nontender, nondistended, BS present Ext: warm and well perfused, b/l  non-pitting edema, +2DP B/L Neuro: alert and oriented X3, cranial nerves II-XII grossly intact, strength and sensation to light touch equal in bilateral upper and lower extremities  Assessment & Plan:  Discussed with Dr. Daryll Drown tdap done today and referred for mammogram

## 2013-07-09 NOTE — Assessment & Plan Note (Signed)
Follows with Dr. Stanford Breed

## 2013-07-09 NOTE — Assessment & Plan Note (Signed)
  Assessment: Progress toward smoking cessation:  smoking less Barriers to progress toward smoking cessation:  none;other (see comments) (trying to quit, has cut down) Comments: down to 1-2 cigs/ day, trying to quit  Plan: Instruction/counseling given:  I counseled patient on the dangers of tobacco use, advised patient to stop smoking, and reviewed strategies to maximize success. Educational resources provided:    Self management tools provided:    Medications to assist with smoking cessation:  None does not want assistance at this time, says will stop on her own Patient agreed to the following self-care plans for smoking cessation: call [facility] (1-800-QUIT-NOW)

## 2013-07-09 NOTE — Assessment & Plan Note (Signed)
Lab Results  Component Value Date   HGBA1C 10.3 07/09/2013   HGBA1C 9.3 04/08/2013   HGBA1C 10.4 01/22/2013    Assessment: Diabetes control: poor control (HgbA1C >9%) Progress toward A1C goal:  deteriorated Comments: increased but says that has recently implemented changes and is down 5 pounds. Hopefully will improve next check. No my hypoglycemic episodes or symptoms. Highest values greater than 200 around dinner time which is her largest meal of the day, will increase dinner time insulin.    Plan: Medications:  continue current medications, lantus 44 units qhs, humalog 8 units with breakfast, 10 units with lunch, and increase to 12 units with dinner Home glucose monitoring: Frequency: 3 times a day Timing: before meals Instruction/counseling given: reminded to bring blood glucose meter & log to each visit, discussed the need for weight loss and discussed diet Educational resources provided:   Self management tools provided: copy of home glucose meter download

## 2013-07-09 NOTE — Patient Instructions (Addendum)
General Instructions:  Great seeing you [name]. Good job on losing weight. PLEASE STOP smoking and continue to exercise and work on weight loss.  Please get your mammogram done.  I have adjusted your humalog regimen: continue 8 units with breakfast and 10 units with lunch but increase to 12 units with dinner.   We have given you your tdap vaccine today  Continue to follow up with cardiology as scheduled.   Please cut down on your starchy foods (rice) and sugary food and drinks   Treatment Goals:  Goals (1 Years of Data) as of 07/09/13         07/05/13 04/08/13 02/19/13 01/22/13 11/08/10     Lifestyle    . Quit smoking / using tobacco   No No       Result Component    . LDL CALC < 100  92   111 93      Progress Toward Treatment Goals:  Treatment Goal 07/09/2013  Hemoglobin A1C deteriorated  Blood pressure at goal  Stop smoking smoking less  Prevent falls -    Self Care Goals & Plans:  Self Care Goal 07/09/2013  Manage my medications take my medicines as prescribed; bring my medications to every visit  Monitor my health keep track of my blood glucose; bring my glucose meter and log to each visit  Eat healthy foods -  Be physically active -  Stop smoking call [facility] (1-800-QUIT-NOW)    Home Blood Glucose Monitoring 07/09/2013  Check my blood sugar 3 times a day  When to check my blood sugar before meals    Care Management & Community Referrals:  Referral 01/30/2013  Referrals made for care management support diabetes educator; nutritionist  Referrals made to community resources smoking cessation; nutrition     Diabetes Meal Planning Guide The diabetes meal planning guide is a tool to help you plan your meals and snacks. It is important for people with diabetes to manage their blood glucose (sugar) levels. Choosing the right foods and the right amounts throughout your day will help control your blood glucose. Eating right can even help you improve your blood pressure  and reach or maintain a healthy weight. CARBOHYDRATE COUNTING MADE EASY When you eat carbohydrates, they turn to sugar. This raises your blood glucose level. Counting carbohydrates can help you control this level so you feel better. When you plan your meals by counting carbohydrates, you can have more flexibility in what you eat and balance your medicine with your food intake. Carbohydrate counting simply means adding up the total amount of carbohydrate grams in your meals and snacks. Try to eat about the same amount at each meal. Foods with carbohydrates are listed below. Each portion below is 1 carbohydrate serving or 15 grams of carbohydrates. Ask your dietician how many grams of carbohydrates you should eat at each meal or snack. Grains and Starches  1 slice bread.   English muffin or hotdog/hamburger bun.   cup cold cereal (unsweetened).   cup cooked pasta or rice.   cup starchy vegetables (corn, potatoes, peas, beans, winter squash).  1 tortilla (6 inches).   bagel.  1 waffle or pancake (size of a CD).   cup cooked cereal.  4 to 6 small crackers. *Whole grain is recommended. Fruit  1 cup fresh unsweetened berries, melon, papaya, pineapple.  1 small fresh fruit.   banana or mango.   cup fruit juice (4 oz unsweetened).   cup canned fruit in natural juice  or water.  2 tbs dried fruit.  12 to 15 grapes or cherries. Milk and Yogurt  1 cup fat-free or 1% milk.  1 cup soy milk.  6 oz light yogurt with sugar-free sweetener.  6 oz low-fat soy yogurt.  6 oz plain yogurt. Vegetables  1 cup raw or  cup cooked is counted as 0 carbohydrates or a "free" food.  If you eat 3 or more servings at 1 meal, count them as 1 carbohydrate serving. Other Carbohydrates   oz chips or pretzels.   cup ice cream or frozen yogurt.   cup sherbet or sorbet.  2 inch square cake, no frosting.  1 tbs honey, sugar, jam, jelly, or syrup.  2 small cookies.  3 squares  of graham crackers.  3 cups popcorn.  6 crackers.  1 cup broth-based soup.  Count 1 cup casserole or other mixed foods as 2 carbohydrate servings.  Foods with less than 20 calories in a serving may be counted as 0 carbohydrates or a "free" food. You may want to purchase a book or computer software that lists the carbohydrate gram counts of different foods. In addition, the nutrition facts panel on the labels of the foods you eat are a good source of this information. The label will tell you how big the serving size is and the total number of carbohydrate grams you will be eating per serving. Divide this number by 15 to obtain the number of carbohydrate servings in a portion. Remember, 1 carbohydrate serving equals 15 grams of carbohydrate. SERVING SIZES Measuring foods and serving sizes helps you make sure you are getting the right amount of food. The list below tells how big or small some common serving sizes are.  1 oz.........4 stacked dice.  3 oz........Marland KitchenDeck of cards.  1 tsp.......Marland KitchenTip of little finger.  1 tbs......Marland KitchenMarland KitchenThumb.  2 tbs.......Marland KitchenGolf ball.   cup......Marland KitchenHalf of a fist.  1 cup.......Marland KitchenA fist. SAMPLE DIABETES MEAL PLAN Below is a sample meal plan that includes foods from the grain and starches, dairy, vegetable, fruit, and meat groups. A dietician can individualize a meal plan to fit your calorie needs and tell you the number of servings needed from each food group. However, controlling the total amount of carbohydrates in your meal or snack is more important than making sure you include all of the food groups at every meal. You may interchange carbohydrate containing foods (dairy, starches, and fruits). The meal plan below is an example of a 2000 calorie diet using carbohydrate counting. This meal plan has 17 carbohydrate servings. Breakfast  1 cup oatmeal (2 carb servings).   cup light yogurt (1 carb serving).  1 cup blueberries (1 carb serving).   cup  almonds. Snack  1 large apple (2 carb servings).  1 low-fat string cheese stick. Lunch  Chicken breast salad.  1 cup spinach.   cup chopped tomatoes.  2 oz chicken breast, sliced.  2 tbs low-fat [place] dressing.  12 whole-wheat crackers (2 carb servings).  12 to 15 grapes (1 carb serving).  1 cup low-fat milk (1 carb serving). Snack  1 cup carrots.   cup hummus (1 carb serving). Dinner  3 oz broiled salmon.  1 cup brown rice (3 carb servings). Snack  1  cups steamed broccoli (1 carb serving) drizzled with 1 tsp olive oil and lemon juice.  1 cup light pudding (2 carb servings). DIABETES MEAL PLANNING WORKSHEET Your dietician can use this worksheet to help you decide how many servings of  foods and what types of foods are right for you.  BREAKFAST Food Group and Servings / Carb Servings Grain/Starches __________________________________ Dairy __________________________________________ Vegetable ______________________________________ Fruit ___________________________________________ Meat __________________________________________ Fat ____________________________________________ LUNCH Food Group and Servings / Carb Servings Grain/Starches ___________________________________ Dairy ___________________________________________ Fruit ____________________________________________ Meat ___________________________________________ Fat _____________________________________________ Wonda Cheng Food Group and Servings / Carb Servings Grain/Starches ___________________________________ Dairy ___________________________________________ Fruit ____________________________________________ Meat ___________________________________________ Fat _____________________________________________ SNACKS Food Group and Servings / Carb Servings Grain/Starches ___________________________________ Dairy ___________________________________________ Vegetable  _______________________________________ Fruit ____________________________________________ Meat ___________________________________________ Fat _____________________________________________ DAILY TOTALS Starches _________________________ Vegetable ________________________ Fruit ____________________________ Dairy ____________________________ Meat ____________________________ Fat ______________________________ Document Released: 06/16/2005 Document Revised: 12/12/2011 Document Reviewed: 04/27/2009 [employer] Patient Information 2014 [employer]  1800 Calorie Diet for Diabetes Meal Planning The 1800 calorie diet is designed for eating up to 1800 calories each day. Following this diet and making healthy meal choices can help improve overall health. This diet controls blood sugar (glucose) levels and can also help lower blood pressure and cholesterol. SERVING SIZES Measuring foods and serving sizes helps to make sure you are getting the right amount of food. The list below tells how big or small some common serving sizes are:  1 oz.........4 stacked dice.  3 oz........Marland KitchenDeck of cards.  1 tsp.......Marland KitchenTip of little finger.  1 tbs......Marland KitchenMarland KitchenThumb.  2 tbs.......Marland KitchenGolf ball.   cup......Marland KitchenHalf of a fist.  1 cup.......Marland KitchenA fist. GUIDELINES FOR CHOOSING FOODS The goal of this diet is to eat a variety of foods and limit calories to 1800 each day. This can be done by choosing foods that are low in calories and fat. The diet also suggests eating small amounts of food frequently. Doing this helps control your blood glucose levels so they do not get too high or too low. Each meal or snack may include a protein food source to help you feel more satisfied and to stabilize your blood glucose. Try to eat about the same amount of food around the same time each day. This includes weekend days, travel days, and days off work. Space your meals about 4 to 5 hours apart and add a snack between them if you wish.   For example, a daily food plan could include breakfast, a morning snack, lunch, dinner, and an evening snack. Healthy meals and snacks include whole grains, vegetables, fruits, lean meats, poultry, fish, and dairy products. As you plan your meals, select a variety of foods. Choose from the bread and starch, vegetable, fruit, dairy, and meat/protein groups. Examples of foods from each group and their suggested serving sizes are listed below. Use measuring cups and spoons to become familiar with what a healthy portion looks like. Bread and Starch Each serving equals 15 grams of carbohydrates.  1 slice bread.   bagel.   cup cold cereal (unsweetened).   cup hot cereal or mashed potatoes.  1 small potato (size of a computer mouse).   cup cooked pasta or rice.   English muffin.  1 cup broth-based soup.  3 cups of popcorn.  4 to 6 whole-wheat crackers.   cup cooked beans, peas, or corn. Vegetable Each serving equals 5 grams of carbohydrates.   cup cooked vegetables.  1 cup raw vegetables.   cup tomato or vegetable juice. Fruit Each serving equals 15 grams of carbohydrates.  1 small apple or orange.  1 cup watermelon or strawberries.   cup applesauce (no sugar added).  2 tbs raisins.   banana.   cup canned fruit, packed in water, its own juice, or sweetened with a sugar  substitute.   cup unsweetened fruit juice. Dairy Each serving equals 12 to 15 grams of carbohydrates.  1 cup fat-free milk.  6 oz artificially sweetened yogurt or plain yogurt.  1 cup low-fat buttermilk.  1 cup soy milk.  1 cup almond milk. Meat/Protein  1 large egg.  2 to 3 oz meat, poultry, or fish.   cup low-fat cottage cheese.  1 tbs peanut butter.  1 oz low-fat cheese.   cup tuna in water.   cup tofu. Fat  1 tsp oil.  1 tsp trans-fat-free margarine.  1 tsp butter.  1 tsp mayonnaise.  2 tbs avocado.  1 tbs salad dressing.  1 tbs cream cheese.  2  tbs sour cream. SAMPLE 1800 CALORIE DIET PLAN Breakfast   cup unsweetened cereal (1 carb serving).  1 cup fat-free milk (1 carb serving).  1 slice whole-wheat toast (1 carb serving).   small banana (1 carb serving).  1 scrambled egg.  1 tsp trans-fat-free margarine. Lunch  Tuna sandwich.  2 slices whole-wheat bread (2 carb servings).   cup canned tuna in water, drained.  1 tbs reduced fat mayonnaise.  1 stalk celery, chopped.  2 slices tomato.  1 lettuce leaf.  1 cup carrot sticks.  24 to 30 seedless grapes (2 carb servings).  6 oz light yogurt (1 carb serving). Afternoon Snack  3 graham cracker squares (1 carb serving).  Fat-free milk, 1 cup (1 carb serving).  1 tbs peanut butter. Dinner  3 oz salmon, broiled with 1 tsp oil.  1 cup mashed potatoes (2 carb servings) with 1 tsp trans-fat-free margarine.  1 cup fresh or frozen green beans.  1 cup steamed asparagus.  1 cup fat-free milk (1 carb serving). Evening Snack  3 cups air-popped popcorn (1 carb serving).  2 tbs parmesan cheese sprinkled on top. MEAL PLAN Use this worksheet to help you make a daily meal plan based on the 1800 calorie diet suggestions. If you are using this plan to help you control your blood glucose, you may interchange carbohydrate-containing foods (dairy, starches, and fruits). Select a variety of fresh foods of varying colors and flavors. The total amount of carbohydrate in your meals or snacks is more important than making sure you include all of the food groups every time you eat. Choose from the following foods to build your day's meals:  8 Starches.  4 Vegetables.  3 Fruits.  2 Dairy.  6 to 7 oz Meat/Protein.  Up to 4 Fats. Your dietician can use this worksheet to help you decide how many servings and which types of foods are right for you. BREAKFAST Food Group and Servings / Food Choice Starch ________________________________________________________ Dairy  _________________________________________________________ Fruit _________________________________________________________ Meat/Protein __________________________________________________ Fat ___________________________________________________________ LUNCH Food Group and Servings / Food Choice Starch ________________________________________________________ Meat/Protein __________________________________________________ Vegetable _____________________________________________________ Fruit _________________________________________________________ Dairy _________________________________________________________ Fat ___________________________________________________________ Wilhemina Bonito Food Group and Servings / Food Choice Starch ________________________________________________________ Meat/Protein __________________________________________________ Fruit __________________________________________________________ Dairy _________________________________________________________ Wonda Cheng Food Group and Servings / Food Choice Starch _________________________________________________________ Meat/Protein ___________________________________________________ Dairy __________________________________________________________ Vegetable ______________________________________________________ Fruit ___________________________________________________________ Fat ____________________________________________________________ Fermin Schwab Food Group and Servings / Food Choice Fruit __________________________________________________________ Meat/Protein ___________________________________________________ Dairy __________________________________________________________ Starch _________________________________________________________ DAILY TOTALS Starch ____________________________ Vegetable _________________________ Fruit _____________________________ Dairy  _____________________________ Meat/Protein______________________ Fat _______________________________ Document Released: 04/11/2005 Document Revised: 12/12/2011 Document Reviewed: 08/05/2011 [employer] Patient Information 2014 [employer]

## 2013-07-09 NOTE — Assessment & Plan Note (Signed)
BP Readings from Last 3 Encounters:  07/09/13 131/79  07/05/13 138/82  05/13/13 170/90   Lab Results  Component Value Date   NA 147* 07/05/2013   K 4.2 07/05/2013   CREATININE 1.0 07/05/2013   Assessment: Blood pressure control: controlled Progress toward BP goal:  at goal  Plan: Medications:  continue current medications norvasc 10mg , lasix 40mg  bid, lisinopril 40mg , and toprol xl 200mg  qd and spironolactone 25mg 

## 2013-07-10 ENCOUNTER — Ambulatory Visit (INDEPENDENT_AMBULATORY_CARE_PROVIDER_SITE_OTHER): Payer: PRIVATE HEALTH INSURANCE | Admitting: Internal Medicine

## 2013-07-10 ENCOUNTER — Encounter: Payer: Self-pay | Admitting: Internal Medicine

## 2013-07-10 VITALS — BP 141/77 | HR 75 | Ht 60.0 in | Wt 233.0 lb

## 2013-07-10 DIAGNOSIS — I428 Other cardiomyopathies: Secondary | ICD-10-CM

## 2013-07-10 DIAGNOSIS — Z95810 Presence of automatic (implantable) cardiac defibrillator: Secondary | ICD-10-CM

## 2013-07-10 DIAGNOSIS — I5042 Chronic combined systolic (congestive) and diastolic (congestive) heart failure: Secondary | ICD-10-CM

## 2013-07-10 LAB — ICD DEVICE OBSERVATION
BATTERY VOLTAGE: 2.55 V
CHARGE TIME: 12.9 s
DEVICE MODEL ICD: 436280
HV IMPEDENCE: 40 Ohm
RV LEAD AMPLITUDE: 12 mv
RV LEAD IMPEDENCE ICD: 485 Ohm
RV LEAD THRESHOLD: 0.5 V
VENTRICULAR PACING ICD: 1 pct

## 2013-07-10 NOTE — Progress Notes (Signed)
HPI [name] returns today for followup. She is a very pleasant 62 year old woman with a nonischemic cardiomyopathy, obesity, hypertension, diabetes, and COPD. She has done well in the interim but she notes that her husband has recently died after struggling against cancer.  She denies chest pain, shortness of breath, syncope, or peripheral edema. She has been fairly sedentary. She states that she walks 2 or 3 times a week about 10 minutes at a time. Allergies  Allergen Reactions  . Avandia [Rosiglitazone Maleate]     unknown  . Hydrocortisone     unknown  . Pioglitazone     REACTION: congestive heart failure ACTOS     Current Outpatient Prescriptions  Medication Sig Dispense Refill  . albuterol (VENTOLIN HFA) 108 (90 BASE) MCG/ACT inhaler Inhale 2 puffs into the lungs every 6 (six) hours as needed. Shortness of breath  1 Inhaler  6  . amLODipine (NORVASC) 10 MG tablet Take 1 tablet (10 mg total) by mouth daily.  30 tablet  11  . aspirin 81 MG tablet Take 1 tablet (81 mg total) by mouth daily.  30 tablet  11  . atorvastatin (LIPITOR) 80 MG tablet Take 1 tablet (80 mg total) by mouth at bedtime.  30 tablet  11  . citalopram (CELEXA) 20 MG tablet Take 1 tablet (20 mg total) by mouth daily.  30 tablet  11  . Fluticasone-Salmeterol (ADVAIR) 250-50 MCG/DOSE AEPB Inhale 1 puff into the lungs every 12 (twelve) hours.  60 each  11  . furosemide (LASIX) 40 MG tablet Take 1 tablet (40 mg total) by mouth 2 (two) times daily.  45 tablet  11  . gabapentin (NEURONTIN) 300 MG capsule Take 1 capsule (300 mg total) by mouth at bedtime.  30 capsule  11  . glucose blood (ONE TOUCH ULTRA TEST) test strip Use as instructed by doctor up to 3x daily,dx code 250.02 insulin requiring  100 each  5  . insulin glargine (LANTUS) 100 UNIT/ML injection Inject 44 Units into the skin daily with breakfast.      . insulin lispro (HUMALOG) 100 UNIT/ML injection Inject 8 units with breakfast and 10 units with lunch and 12  units with dinner  10 mL  5  . ketoconazole (NIZORAL) 2 % shampoo Apply 1 application topically 3 (three) times a week.  120 mL  3  . lisinopril (PRINIVIL,ZESTRIL) 40 MG tablet Take 1 tablet (40 mg total) by mouth daily.  30 tablet  11  . metoprolol (TOPROL-XL) 200 MG 24 hr tablet Take 1 tablet (200 mg total) by mouth daily.  30 tablet  11  . nitroGLYCERIN (NITROSTAT) 0.4 MG SL tablet Place 1 tablet (0.4 mg total) under the tongue every 5 (five) minutes as needed for chest pain.  25 tablet  3  . spironolactone (ALDACTONE) 25 MG tablet Take 1 tablet (25 mg total) by mouth daily.  30 tablet  11  . triamcinolone (KENALOG) 0.1 % ointment Apply 1 application topically as needed. rash       No current facility-administered medications for this visit.     Past Medical History  Diagnosis Date  . Hyperthyroidism, subclinical   . Post menopausal syndrome   . Obesity   . Cardiac defibrillator in situ     Atlas II VR (SJM) implanted by Dr Lovena Le  . Eczema   . Lipoma   . Chronic ulcer of leg   . Hyperlipidemia   . Chronic combined systolic and diastolic heart failure  a. EF 35-40% in past;  b. Echo 7/13:  EF 45-50%, Gr 2 diast dysfn, mild AI, mild MAC, trivial MR, mild LAE, PASP 47.  [name] NICM (nonischemic cardiomyopathy)   . Diabetes mellitus   . HTN (hypertension)   . Elevated alkaline phosphatase level     GGT and 5'nucleotidase 8/13 normal  . Hx of cardiac cath     a. [place] 2003 normal;  b. LHC 6/13:  Mild calcification in the LM, o/w normal coronary arteries, EF 45%.   . Sleep apnea     pt denies 04/12/2013  . COPD (chronic obstructive pulmonary disease)     O2 at night  . Automatic implantable cardioverter-defibrillator in situ   . CHF (congestive heart failure)   . Depression     ROS:   All systems reviewed and negative except as noted in the HPI.   Past Surgical History  Procedure Laterality Date  . Cardiac defibrillator placement  05/04/2007    SJM Atlas II VR ICD  .  Hysteroscopy    . Cardiac defibrillator placement    . Abdominal hysterectomy    . Cardiac catheterization    . Insert / replace / remove pacemaker    . Tubal ligation    . Hernia repair    . Colonoscopy N/A 04/12/2013    Procedure: COLONOSCOPY;  Surgeon: Beryle Beams, MD;  Location: [facility];  Service: Endoscopy;  Laterality: N/A;  pt.has defibrilator     Family History  Problem Relation Age of Onset  . Stroke Mother   . Seizures Father   . Diabetes Sister   . Asthma Maternal Aunt     aunts  . Asthma Maternal Uncle     uncles  . Heart disease Father   . Heart disease Paternal Aunt     aunts  . Heart disease Paternal Uncle     uncles  . Heart disease Maternal Aunt     aunts  . Heart disease Maternal Uncle     uncles  . Heart disease Maternal Grandfather      History   Social History  . Marital Status: Married    Spouse Name: Alroy Dust    Number of Children: N/A  . Years of Education: N/A   Occupational History  . disabled    Social History Main Topics  . Smoking status: Light Tobacco Smoker -- 0.25 packs/day for 35 years    Types: Cigarettes  . Smokeless tobacco: Not on file     Comment: currently smoking 1-2 cigs daily.   . Alcohol Use: 1.0 oz/week     Comment: Occasionally - wine.  . Drug Use: No  . Sexual Activity: No   Other Topics Concern  . Not on file   Social History Narrative   ** Merged History Encounter **       Married     BP 141/77  Pulse 75  Ht 5' (1.524 m)  Wt 233 lb (105.688 kg)  BMI 45.5 kg/m2  Physical Exam:  Well appearing obese,middle-aged woman, NAD HEENT: Unremarkable Neck:  7 cm JVD, no thyromegally Lungs:  Clear with no wheezes, rales, or rhonchi. HEART:  Regular rate rhythm, no murmurs, no rubs, no clicks Abd:  soft, positive bowel sounds, no organomegally, no rebound, no guarding Ext:  2 plus pulses, no edema, no cyanosis, no clubbing Skin:  No rashes no nodules Neuro:  CN II through XII intact, motor  grossly intact  DEVICE  Normal device function.  See PaceArt  for details.   Assess/Plan:

## 2013-07-10 NOTE — Assessment & Plan Note (Signed)
Her St. Jude ICD is working normally. She is approaching elective replacement. We'll plan to recheck in several months.

## 2013-07-10 NOTE — Patient Instructions (Addendum)
Your physician recommends that you schedule a follow-up appointment in: [place]  Your physician wants you to follow-up in: 1 YEAR with Dr. Knox Saliva will receive a reminder letter in the mail two months in advance. If you don't receive a letter, please call our office to schedule the follow-up appointment.  Your physician recommends that you continue on your current medications as directed. Please refer to the Current Medication list given to you today.

## 2013-07-10 NOTE — Assessment & Plan Note (Signed)
Her ejection fraction has improved since her device was placed 6 years ago. She is not yet at elective replacement. When she reaches elective replacement, she will undergo 2-D echocardiography to confirm as to whether or not her ejection fraction is severe enough to warrant ICD reimplantation. For now she will continue her current medical therapy, and maintain a low-sodium diet.

## 2013-07-12 NOTE — Progress Notes (Signed)
Case discussed with Dr. Eula Fried soon after the resident saw the patient.  We reviewed the resident's history and exam and pertinent patient test results.  I agree with the assessment, diagnosis, and plan of care documented in the resident's note.  Would follow up on mildly elevated serum at next visit to ensure resolution.

## 2013-07-12 NOTE — Addendum Note (Signed)
Addended by: Gilles Chiquito B on: 07/12/2013 08:19 AM   Modules accepted: Level of Service

## 2013-07-18 ENCOUNTER — Encounter: Payer: PRIVATE HEALTH INSURANCE | Admitting: Internal Medicine

## 2013-07-19 ENCOUNTER — Ambulatory Visit (HOSPITAL_COMMUNITY)
Admission: RE | Admit: 2013-07-19 | Discharge: 2013-07-19 | Disposition: A | Discharge status: Home / Self Care | Payer: PRIVATE HEALTH INSURANCE | Source: Ambulatory Visit | Attending: Internal Medicine | Admitting: Internal Medicine

## 2013-07-19 DIAGNOSIS — Z1231 Encounter for screening mammogram for malignant neoplasm of breast: Secondary | ICD-10-CM | POA: Insufficient documentation

## 2013-07-19 DIAGNOSIS — Z1239 Encounter for other screening for malignant neoplasm of breast: Secondary | ICD-10-CM

## 2013-08-28 ENCOUNTER — Encounter: Payer: Self-pay | Admitting: *Deleted

## 2013-08-28 ENCOUNTER — Telehealth: Payer: Self-pay | Admitting: Dietician

## 2013-08-28 NOTE — Telephone Encounter (Signed)
Called to follow up on her blood sugar and diabetes self care. She says her sugars are better because she is eating better. She says her sugars got out of whack when she lost husband in august but she is better now. Did not know she had an appointment next week and prefers morning appointment so she can get a ride. Asked her to check her blood sugar 4 times day for 2 days before her next appointment instead of her usual 2 times a day. She verbalized understanding. Told her I would speak with front office and we'd be in touch with her about her appointment next week.

## 2013-09-03 ENCOUNTER — Ambulatory Visit (INDEPENDENT_AMBULATORY_CARE_PROVIDER_SITE_OTHER): Payer: PRIVATE HEALTH INSURANCE | Admitting: Internal Medicine

## 2013-09-03 ENCOUNTER — Encounter: Payer: Self-pay | Admitting: Internal Medicine

## 2013-09-03 VITALS — BP 126/80 | HR 79 | Temp 96.8°F | Ht 61.0 in | Wt 237.3 lb

## 2013-09-03 DIAGNOSIS — E669 Obesity, unspecified: Secondary | ICD-10-CM

## 2013-09-03 DIAGNOSIS — I10 Essential (primary) hypertension: Secondary | ICD-10-CM

## 2013-09-03 DIAGNOSIS — F17200 Nicotine dependence, unspecified, uncomplicated: Secondary | ICD-10-CM

## 2013-09-03 DIAGNOSIS — E119 Type 2 diabetes mellitus without complications: Secondary | ICD-10-CM

## 2013-09-03 DIAGNOSIS — R748 Abnormal levels of other serum enzymes: Secondary | ICD-10-CM

## 2013-09-03 DIAGNOSIS — I5042 Chronic combined systolic (congestive) and diastolic (congestive) heart failure: Secondary | ICD-10-CM

## 2013-09-03 LAB — GLUCOSE, CAPILLARY: Glucose-Capillary: 155 mg/dL — ABNORMAL HIGH (ref 70–99)

## 2013-09-03 LAB — COMPREHENSIVE METABOLIC PANEL
ALT: 19 U/L (ref 0–35)
AST: 21 U/L (ref 0–37)
Albumin: 3.9 g/dL (ref 3.5–5.2)
Alkaline Phosphatase: 134 U/L — ABNORMAL HIGH (ref 39–117)
BUN: 18 mg/dL (ref 6–23)
CO2: 26 mEq/L (ref 19–32)
Calcium: 9.4 mg/dL (ref 8.4–10.5)
Chloride: 107 mEq/L (ref 96–112)
Creat: 0.71 mg/dL (ref 0.50–1.10)
Glucose, Bld: 86 mg/dL (ref 70–99)
Potassium: 3.6 mEq/L (ref 3.5–5.3)
Sodium: 146 mEq/L — ABNORMAL HIGH (ref 135–145)
Total Bilirubin: 0.3 mg/dL (ref 0.3–1.2)
Total Protein: 7.7 g/dL (ref 6.0–8.3)

## 2013-09-03 LAB — GAMMA GT: GGT: 39 U/L (ref 7–51)

## 2013-09-03 NOTE — Patient Instructions (Addendum)
General Instructions: Please stop smoking [name]  Please get back on track with your diet and diabetes and return in 2 months (feb) for recheck. You need to cut down the starchy and fatty foods, try to avoid cheese and see if that helps with the gas  Please follow up with cardiology in Jan as scheduled  Treatment Goals:  Goals (1 Years of Data) as of 09/03/13         07/09/13 07/05/13 04/08/13 02/19/13 01/22/13     Lifestyle    . Quit smoking / using tobacco  No  No No      Result Component    . LDL CALC < 100   92   111      Progress Toward Treatment Goals:  Treatment Goal 09/03/2013  Hemoglobin A1C unable to assess  Blood pressure at goal  Stop smoking smoking less  Prevent falls -    Self Care Goals & Plans:  Self Care Goal 09/03/2013  Manage my medications -  Monitor my health -  Eat healthy foods -  Be physically active -  Stop smoking call [facility] (1-800-QUIT-NOW)    Home Blood Glucose Monitoring 09/03/2013  Check my blood sugar 3 times a day  When to check my blood sugar before meals     Care Management & Community Referrals:  Referral 01/30/2013  Referrals made for care management support diabetes educator; nutritionist  Referrals made to community resources smoking cessation; nutrition

## 2013-09-03 NOTE — Progress Notes (Signed)
Subjective:   Patient ID: CAMAS KINSMAN female   DOB: 1951/09/16 62 y.o.   MRN: [id number]  HPI: Ms.Evalee Jerilynn Mages Russi is a 62 y.o. African American female with PMH of combined systolic and diastolic CHF EF [id number] grade 2 diastolic dysfunction, NICM, DM, HLD, ICD, and hyperthyroidism presenting to [facility] today for routine follow up visit. She reports doing better since the recent death of her husband and is starting to get back on track with her diabetes. She says her family has been a great support system helping her out at home as well.    In regards to her diabetes, she denies hypoglycemic events, however, she does have several hyperglycemia episodes, with CBG's up to 400's. She admits to not following a strict diabetic diet at all but says she is hopeful to get back to being serious and would like for [place] to give her a chance before making changes to her medication. She also does not wish to meet with CDE again at this time but we did discuss that if her values do not start improving that will likely be very beneficial and should be strongly considered. We did review decreasing her starches in her regular diet from now on and working on glucose control along with exercise.  She admits that she started drinking wine a lot more than usual over the past few months but realized it was getting to be excessive and has stopped since then. She does unfortunately also continue to smoke cigarettes and is not ready to quit at this time but will try to cut down again.    She has an upcoming appointment with cardiology scheduled and her BP is well controlled today with adherence to her medications.   Past Medical History  Diagnosis Date  . Hyperthyroidism, subclinical   . Post menopausal syndrome   . Obesity   . Cardiac defibrillator in situ     Atlas II VR (SJM) implanted by Dr Lovena Le  . Eczema   . Lipoma   . Chronic ulcer of leg   . Hyperlipidemia   . Chronic combined systolic and diastolic heart failure    a. EF 35-40% in past;  b. Echo 7/13:  EF 45-50%, Gr 2 diast dysfn, mild AI, mild MAC, trivial MR, mild LAE, PASP 47.  [name] NICM (nonischemic cardiomyopathy)   . Diabetes mellitus   . HTN (hypertension)   . Elevated alkaline phosphatase level     GGT and 5'nucleotidase 8/13 normal  . Hx of cardiac cath     a. [place] 2003 normal;  b. LHC 6/13:  Mild calcification in the LM, o/w normal coronary arteries, EF 45%.   . Sleep apnea     pt denies 04/12/2013  . COPD (chronic obstructive pulmonary disease)     O2 at night  . Automatic implantable cardioverter-defibrillator in situ   . CHF (congestive heart failure)   . Depression    Current Outpatient Prescriptions  Medication Sig Dispense Refill  . albuterol (VENTOLIN HFA) 108 (90 BASE) MCG/ACT inhaler Inhale 2 puffs into the lungs every 6 (six) hours as needed. Shortness of breath  1 Inhaler  6  . amLODipine (NORVASC) 10 MG tablet Take 1 tablet (10 mg total) by mouth daily.  30 tablet  11  . aspirin 81 MG tablet Take 1 tablet (81 mg total) by mouth daily.  30 tablet  11  . atorvastatin (LIPITOR) 80 MG tablet Take 1 tablet (80 mg total) by mouth at bedtime.  30 tablet  11  . citalopram (CELEXA) 20 MG tablet Take 1 tablet (20 mg total) by mouth daily.  30 tablet  11  . Fluticasone-Salmeterol (ADVAIR) 250-50 MCG/DOSE AEPB Inhale 1 puff into the lungs every 12 (twelve) hours.  60 each  11  . furosemide (LASIX) 40 MG tablet Take 1 tablet (40 mg total) by mouth 2 (two) times daily.  45 tablet  11  . gabapentin (NEURONTIN) 300 MG capsule Take 1 capsule (300 mg total) by mouth at bedtime.  30 capsule  11  . glucose blood (ONE TOUCH ULTRA TEST) test strip Use as instructed by doctor up to 3x daily,dx code 250.02 insulin requiring  100 each  5  . insulin glargine (LANTUS) 100 UNIT/ML injection Inject 44 Units into the skin daily with breakfast.      . insulin lispro (HUMALOG) 100 UNIT/ML injection Inject 8 units with breakfast and 10 units with lunch and 12 units  with dinner  10 mL  5  . ketoconazole (NIZORAL) 2 % shampoo Apply 1 application topically 3 (three) times a week.  120 mL  3  . lisinopril (PRINIVIL,ZESTRIL) 40 MG tablet Take 1 tablet (40 mg total) by mouth daily.  30 tablet  11  . metoprolol (TOPROL-XL) 200 MG 24 hr tablet Take 1 tablet (200 mg total) by mouth daily.  30 tablet  11  . nitroGLYCERIN (NITROSTAT) 0.4 MG SL tablet Place 1 tablet (0.4 mg total) under the tongue every 5 (five) minutes as needed for chest pain.  25 tablet  3  . spironolactone (ALDACTONE) 25 MG tablet Take 1 tablet (25 mg total) by mouth daily.  30 tablet  11  . triamcinolone (KENALOG) 0.1 % ointment Apply 1 application topically as needed. rash       No current facility-administered medications for this visit.   Family History  Problem Relation Age of Onset  . Stroke Mother   . Seizures Father   . Diabetes Sister   . Asthma Maternal Aunt     aunts  . Asthma Maternal Uncle     uncles  . Heart disease Father   . Heart disease Paternal Aunt     aunts  . Heart disease Paternal Uncle     uncles  . Heart disease Maternal Aunt     aunts  . Heart disease Maternal Uncle     uncles  . Heart disease Maternal Grandfather    History   Social History  . Marital Status: Widowed    Spouse Name: Alroy Dust    Number of Children: N/A  . Years of Education: N/A   Occupational History  . disabled    Social History Main Topics  . Smoking status: Light Tobacco Smoker -- 0.25 packs/day for 35 years    Types: Cigarettes  . Smokeless tobacco: None     Comment: currently smoking 1-2 cigs daily.   . Alcohol Use: 1.0 oz/week     Comment: Occasionally - wine coolers.  . Drug Use: No  . Sexual Activity: None   Other Topics Concern  . None   Social History Narrative   ** Merged History Encounter **       Married   Review of Systems:  Constitutional:  Denies fever, chills, diaphoresis, appetite change and fatigue.   HEENT:  Denies congestion, sore throat    Respiratory:  Denies SOB, DOE, cough, and wheezing.   Cardiovascular:  Denies chest pain, palpitations  Gastrointestinal:  +gas. Denies nausea, vomiting, abdominal pain,  diarrhea, constipation, blood in stool.  Genitourinary:  Denies dysuria, urgency, frequency, hematuria, flank pain and difficulty urinating.   Musculoskeletal:  Denies myalgias and gait problem.   Skin:  Denies pallor, rash and wound.   Neurological:  Denies dizziness, syncope, weakness, light-headedness, numbness and headaches.    Objective:  Physical Exam: Filed Vitals:   09/03/13 1620  BP: 126/80  Pulse: 79  Temp: 96.8 F (36 C)  TempSrc: Oral  Height: 5\' 1"  (1.549 m)  Weight: 237 lb 4.8 oz (107.639 kg)  SpO2: 95%   Vitals reviewed.  General: sitting in chair, NAD  HEENT: PERRL, EOMI Cardiac: RRR, no rubs, murmurs or gallops  Pulm: clear to auscultation bilaterally, no wheezes, rales, or rhonchi  Abd: soft, obese, nontender, nondistended, BS present  Ext: warm and well perfused, moving all 4 extremities Neuro: alert and oriented X3, cranial nerves II-XII grossly intact, strength equal in bilateral upper and lower extremities   Assessment & Plan:  Discussed with Dr. Daryll Drown -Elevated ALP ~1 year, will get GGT

## 2013-09-06 DIAGNOSIS — R748 Abnormal levels of other serum enzymes: Secondary | ICD-10-CM | POA: Insufficient documentation

## 2013-09-06 NOTE — Assessment & Plan Note (Signed)
Reviewed weight loss, diet modifications, and exercise as tolerated strategies today

## 2013-09-06 NOTE — Progress Notes (Signed)
Case discussed with Dr. Qureshi at the time of the visit.  We reviewed the resident's history and exam and pertinent patient test results.  I agree with the assessment, diagnosis, and plan of care documented in the resident's note. 

## 2013-09-06 NOTE — Assessment & Plan Note (Signed)
Follow up with Dr. Lovena Le, she says she has an appointment scheduled for January

## 2013-09-06 NOTE — Assessment & Plan Note (Addendum)
~  mainly elevated since 2013 but some values in 2010 and 2012. ALP up to 134 today   -check GGT--39 -based on algorithm on up to date: since GGT is not elevated, will need to evaluate for bone origin. Although, possibly post prandial increase? May consider repeat when fasting. Will discuss further with [name] and proceed with further investigations at that time.  -of note, dexa scan considered within normal range in 2010

## 2013-09-06 NOTE — Assessment & Plan Note (Signed)
BP Readings from Last 3 Encounters:  09/03/13 126/80  07/10/13 141/77  07/09/13 131/79   Lab Results  Component Value Date   NA 146* 09/03/2013   K 3.6 09/03/2013   CREATININE 0.71 09/03/2013    Assessment: Blood pressure control: controlled Progress toward BP goal:  at goal  Plan: Medications:  continue current medications norvasc 10mg , lasix 40mg  bid, lisinopril 40mg , toprol xl 200mg , and aldactone 25mg 

## 2013-09-06 NOTE — Assessment & Plan Note (Signed)
Lab Results  Component Value Date   HGBA1C 10.3 07/09/2013   HGBA1C 9.3 04/08/2013   HGBA1C 10.4 01/22/2013    Assessment: Diabetes control: poor control (HgbA1C >9%) Progress toward A1C goal:  unable to assess Comments: since death of her husband has had poor control and lack of controlled diabetic diet but ready to get back to improving her lifestyle diet and exercise. She does not want modifications in insulin at this time and says she would like the next two months to improve on her own and if not, then will make changes. We have discussed diet modifications in detail today, importance of exercise as tolerated, and regular checking of blood sugars. She denies any hypoglycemic events but does occasionally have cbg's up to 400's but admits she knows when that happens. She declines seeing CDE at this time. I think at this time it is reasonable to give [name] a chance to improve things on her own and monitor her closely. Therefore, we will recheck a1c in February and review cbg's and make changes as necessary at this time. We have reviewed the importance of improved diabetic control and she is aware of the complications and is serious about making changes. Hopefully, this will be the case.   Plan: Medications:  continue current medications: lantus 44 units and humalog  Home glucose monitoring:8 units in am, 10 units in lunch, and 12 units in PM Frequency: 3 times a day Timing: before meals Instruction/counseling given: reminded to bring blood glucose meter & log to each visit, reminded to bring medications to each visit, discussed foot care, discussed the need for weight loss and discussed diet

## 2013-09-06 NOTE — Assessment & Plan Note (Signed)
  Assessment: Progress toward smoking cessation:  smoking less Barriers to progress toward smoking cessation:    Comments: continues to smoke, lacks motivation to quit at this time but will try to cut down again  Plan: Instruction/counseling given:  I counseled patient on the dangers of tobacco use, advised patient to stop smoking, and reviewed strategies to maximize success. Educational resources provided:    Self management tools provided:    Medications to assist with smoking cessation:  None Patient agreed to the following self-care plans for smoking cessation: call [facility] (1-800-QUIT-NOW)

## 2013-10-08 ENCOUNTER — Encounter: Payer: Self-pay | Admitting: Internal Medicine

## 2013-10-08 ENCOUNTER — Ambulatory Visit (INDEPENDENT_AMBULATORY_CARE_PROVIDER_SITE_OTHER): Payer: PRIVATE HEALTH INSURANCE | Admitting: Internal Medicine

## 2013-10-08 ENCOUNTER — Encounter: Payer: PRIVATE HEALTH INSURANCE | Admitting: Internal Medicine

## 2013-10-08 VITALS — BP 129/75 | HR 73 | Temp 96.9°F | Ht 61.0 in | Wt 238.6 lb

## 2013-10-08 DIAGNOSIS — F17200 Nicotine dependence, unspecified, uncomplicated: Secondary | ICD-10-CM

## 2013-10-08 DIAGNOSIS — I5042 Chronic combined systolic (congestive) and diastolic (congestive) heart failure: Secondary | ICD-10-CM

## 2013-10-08 DIAGNOSIS — E162 Hypoglycemia, unspecified: Secondary | ICD-10-CM

## 2013-10-08 DIAGNOSIS — E119 Type 2 diabetes mellitus without complications: Secondary | ICD-10-CM

## 2013-10-08 DIAGNOSIS — I10 Essential (primary) hypertension: Secondary | ICD-10-CM

## 2013-10-08 LAB — GLUCOSE, CAPILLARY: Glucose-Capillary: 109 mg/dL — ABNORMAL HIGH (ref 70–99)

## 2013-10-08 LAB — POCT GLYCOSYLATED HEMOGLOBIN (HGB A1C): Hemoglobin A1C: 10

## 2013-10-08 MED ORDER — INSULIN LISPRO 100 UNIT/ML ~~LOC~~ SOLN: SUBCUTANEOUS | Status: DC

## 2013-10-08 NOTE — Assessment & Plan Note (Signed)
Lab Results  Component Value Date   HGBA1C 10.0 10/08/2013   HGBA1C 10.3 07/09/2013   HGBA1C 9.3 04/08/2013    Assessment: Diabetes control: poor control (HgbA1C >9%) Progress toward A1C goal:  improved Comments: mildly improved, reports hypoglycemia episodes x2, both times either eating less at dinner or skipping dinner but still taking night time humalog 12 units and lantus. Average cbg's on meter improved ~200's, highest at lunch, but difficult to interpret because some values are post prandial.   Plan: Medications:  continue current medications lantus 44 units in AM. humalog 8 units with breakfast, 12 units with lunch, and 10 units with dinner. No humalog if skipping meals. Check sugars before meals! Home glucose monitoring: Frequency: 3 times a day Timing: before meals Instruction/counseling given: reminded to bring blood glucose meter & log to each visit and discussed the need for weight loss Educational resources provided:   Self management tools provided:   Other plans: educated again on hypoglycemia, not to skip meals. Do not take humalog dose when skipping a meal. Still check blood sugar. Take lantus in AM

## 2013-10-08 NOTE — Assessment & Plan Note (Signed)
  Assessment: Progress toward smoking cessation:  smoking less Barriers to progress toward smoking cessation:    Comments: down to 3 cigs/day and no alcohol  Plan: Instruction/counseling given:  I counseled patient on the dangers of tobacco use, advised patient to stop smoking, and reviewed strategies to maximize success. Educational resources provided:    Self management tools provided:    Medications to assist with smoking cessation:  None Patient agreed to the following self-care plans for smoking cessation: cut down the number of cigarettes smoked

## 2013-10-08 NOTE — Patient Instructions (Addendum)
Thank you for coming in today [name].   Please continue to work on your diabetes. Always check your blood sugars before meals three times a day and if you feel it low, correct by drinking juice or candy and check it at that time so we have reference of when it is happening.   For now we have changed your regimen.  Take lantus in the MORNING  Keep taking 8 units of humalog with breakfast, but increase to 12 units with lunch and only 10 units with dinner  DO NOT TAKE HUMALOG IF YOU SKIP A MEAL, BUT STILL CHECK YOUR BLOOD SUGAR TO MAKE SURE IT IS NOT TOO LOW  Great job on cutting down on cigarettes and alcohol, keep trying to work on weight loss and exercise  Follow up with me in 3 months  Low Blood Sugar Low blood sugar (hypoglycemia) means that the level of sugar in your blood is lower than it should be. Signs of low blood sugar include:  Getting sweaty.  Feeling hungry.  Feeling dizzy or weak.  Feeling sleepier than normal.  Feeling nervous.  Headaches.  Having a fast heartbeat. Low blood sugar can happen fast and can be an emergency. Your doctor can do tests to check your blood sugar level. You can have low blood sugar and not have diabetes. HOME CARE  Check your blood sugar as told by your doctor. If it is less than 70 mg/dl or as told by your doctor, take 1 of the following:  3 to 4 glucose tablets.   cup clear juice.   cup soda pop, not diet.  1 cup milk.  5 to 6 hard candies.  Recheck blood sugar after 15 minutes. Repeat until it is at the right level.  Eat a snack if it is more than 1 hour until the next meal.  Only take medicine as told by your doctor.  Do not skip meals. Eat on time.  Do not drink alcohol except with meals.  Check your blood glucose before driving.  Check your blood glucose before and after exercise.  Always carry treatment with you, such as glucose pills.  Always wear a medical alert bracelet if you have diabetes. GET HELP  RIGHT AWAY IF:   Your blood glucose goes below 70 mg/dl or as told by your doctor, and you:  Are confused.  Are not able to swallow.  Pass out (faint).  You cannot treat yourself. You may need someone to help you.  You have low blood sugar problems often.  You have problems from your medicines.  You are not feeling better after 3 to 4 days.  You have vision changes. MAKE SURE YOU:   Understand these instructions.  Will watch this condition.  Will get help right away if you are not doing well or get worse. Document Released: 12/14/2009 Document Revised: 12/12/2011 Document Reviewed: 12/14/2009 [facility] Patient Information 2014 [place], [place].

## 2013-10-08 NOTE — Assessment & Plan Note (Signed)
Says cardiologist office said they would call her for appointment later this month or feb. i asked her to check in with them for when follow up appointment will be.   -adherent to medications

## 2013-10-08 NOTE — Progress Notes (Signed)
Subjective:   Patient ID: [name] female   DOB: [birth year] 63 y.o.   MRN: [id number]  HPI: [name] is a 63 y.o. African American female with PMH of combined systolic and diastolic CHF EF [id number] grade 2 diastolic dysfunction, NICM, DM, HLD, ICD, and hyperthyroidism presenting to [facility] today for routine visit.  She was supposed to come back in February, however, she says she was called by our office to come for an appointment today but she does not know why.  She has no complaints, has been working on her diet and diabetes since our last visit and has been smoking less (down to 3 cig/s day and has stopped drinking alcohol). Since stopping alcohol, she has less gas which makes her feel much better.   In regards to her diabetes, she says she has been checking her blood sugars and have been in the 100's now but yesterday night she felt like her sugar got low, sweaty and feeling nervous. She did not check her blood sugar but it improved with a piece of sugar candy and then she went to bed.  She also felt the same Sunday night. She does admit to skipping dinner at times but still taking her insulin.  She has been counseled extensively that if she skips a meal, she should not give herself the night insulin and check her blood sugar to make sure it is not too low. She says she understands and will work on not skipping anymore meals and will take insulin with meals only. She has been taking her lantus at night.  We discussed taking lantus in the morning from now on and may need to adjust her humalog. We reviewed hypoglycemia and signs and symptoms and how to correct if needed which she is able to relay back to me. She says she usually prefers to correct with juice or candy.   Past Medical History  Diagnosis Date  . Hyperthyroidism, subclinical   . Post menopausal syndrome   . Obesity   . Cardiac defibrillator in situ     Atlas II VR (SJM) implanted by Dr Lovena Le  . Eczema   . Lipoma   . Chronic  ulcer of leg   . Hyperlipidemia   . Chronic combined systolic and diastolic heart failure     a. EF 35-40% in past;  b. Echo 7/13:  EF 45-50%, Gr 2 diast dysfn, mild AI, mild MAC, trivial MR, mild LAE, PASP 47.  [name] NICM (nonischemic cardiomyopathy)   . Diabetes mellitus   . HTN (hypertension)   . Elevated alkaline phosphatase level     GGT and 5'nucleotidase 8/13 normal  . Hx of cardiac cath     a. [place] 2003 normal;  b. LHC 6/13:  Mild calcification in the LM, o/w normal coronary arteries, EF 45%.   . Sleep apnea     pt denies 04/12/2013  . COPD (chronic obstructive pulmonary disease)     O2 at night  . Automatic implantable cardioverter-defibrillator in situ   . CHF (congestive heart failure)   . Depression    Current Outpatient Prescriptions  Medication Sig Dispense Refill  . albuterol (VENTOLIN HFA) 108 (90 BASE) MCG/ACT inhaler Inhale 2 puffs into the lungs every 6 (six) hours as needed. Shortness of breath  1 Inhaler  6  . amLODipine (NORVASC) 10 MG tablet Take 1 tablet (10 mg total) by mouth daily.  30 tablet  11  . aspirin 81 MG tablet Take 1  tablet (81 mg total) by mouth daily.  30 tablet  11  . atorvastatin (LIPITOR) 80 MG tablet Take 1 tablet (80 mg total) by mouth at bedtime.  30 tablet  11  . citalopram (CELEXA) 20 MG tablet Take 1 tablet (20 mg total) by mouth daily.  30 tablet  11  . Fluticasone-Salmeterol (ADVAIR) 250-50 MCG/DOSE AEPB Inhale 1 puff into the lungs every 12 (twelve) hours.  60 each  11  . furosemide (LASIX) 40 MG tablet Take 1 tablet (40 mg total) by mouth 2 (two) times daily.  45 tablet  11  . gabapentin (NEURONTIN) 300 MG capsule Take 1 capsule (300 mg total) by mouth at bedtime.  30 capsule  11  . glucose blood (ONE TOUCH ULTRA TEST) test strip Use as instructed by doctor up to 3x daily,dx code 250.02 insulin requiring  100 each  5  . insulin glargine (LANTUS) 100 UNIT/ML injection Inject 44 Units into the skin daily with breakfast.      . insulin lispro  (HUMALOG) 100 UNIT/ML injection Inject 8 units with breakfast and 10 units with lunch and 12 units with dinner  10 mL  5  . ketoconazole (NIZORAL) 2 % shampoo Apply 1 application topically 3 (three) times a week.  120 mL  3  . lisinopril (PRINIVIL,ZESTRIL) 40 MG tablet Take 1 tablet (40 mg total) by mouth daily.  30 tablet  11  . metoprolol (TOPROL-XL) 200 MG 24 hr tablet Take 1 tablet (200 mg total) by mouth daily.  30 tablet  11  . nitroGLYCERIN (NITROSTAT) 0.4 MG SL tablet Place 1 tablet (0.4 mg total) under the tongue every 5 (five) minutes as needed for chest pain.  25 tablet  3  . spironolactone (ALDACTONE) 25 MG tablet Take 1 tablet (25 mg total) by mouth daily.  30 tablet  11  . triamcinolone (KENALOG) 0.1 % ointment Apply 1 application topically as needed. rash       No current facility-administered medications for this visit.   Family History  Problem Relation Age of Onset  . Stroke Mother   . Seizures Father   . Diabetes Sister   . Asthma Maternal Aunt     aunts  . Asthma Maternal Uncle     uncles  . Heart disease Father   . Heart disease Paternal Aunt     aunts  . Heart disease Paternal Uncle     uncles  . Heart disease Maternal Aunt     aunts  . Heart disease Maternal Uncle     uncles  . Heart disease Maternal Grandfather    History   Social History  . Marital Status: Widowed    Spouse Name: Alroy Dust    Number of Children: N/A  . Years of Education: N/A   Occupational History  . disabled    Social History Main Topics  . Smoking status: Light Tobacco Smoker -- 0.25 packs/day for 35 years    Types: Cigarettes  . Smokeless tobacco: None     Comment: currently smoking 5 cigs daily or less.  . Alcohol Use: No  . Drug Use: No  . Sexual Activity: None   Other Topics Concern  . None   Social History Narrative   ** Merged History Encounter **       Married   Review of Systems:  Constitutional:  Denies fever, chills, diaphoresis, appetite change and  fatigue.   HEENT:  Denies congestion, sore throat  Respiratory:  DOE. Denies SOB,  cough, and wheezing.   Cardiovascular:  Denies chest pain, palpitations, and leg swelling.   Gastrointestinal:  Denies nausea, vomiting, abdominal pain, diarrhea, constipation  Genitourinary:  Denies dysuria, urgency, frequency  Musculoskeletal:  Denies myalgias, back pain, joint swelling, arthralgias and gait problem.   Skin:  Denies pallor, rash and wound.   Neurological:  Denies dizziness, syncope, weakness, light-headedness, numbness and headaches.    Objective:  Physical Exam: Filed Vitals:   10/08/13 1457  BP: 129/75  Pulse: 73  Temp: 96.9 F (36.1 C)  TempSrc: Oral  Height: 5\' 1"  (1.549 m)  Weight: 238 lb 9.6 oz (108.228 kg)  SpO2: 95%   Vitals reviewed. General: sitting in chair, NAD HEENT: PERRL, EOMI Cardiac: RRR Pulm: clear to auscultation bilaterally, no wheezes Abd: soft, nontender, nondistended, BS present Ext: warm and well perfused, no pitting edema, +2DP B/L Neuro: alert and oriented X3, cranial nerves II-XII grossly intact, strength and sensation to light touch equal in bilateral upper and lower extremities  Assessment & Plan:  Discussed with Dr. Eppie Gibson

## 2013-10-08 NOTE — Assessment & Plan Note (Signed)
BP Readings from Last 3 Encounters:  10/08/13 129/75  09/03/13 126/80  07/10/13 141/77   Lab Results  Component Value Date   NA 146* 09/03/2013   K 3.6 09/03/2013   CREATININE 0.71 09/03/2013   Assessment: Blood pressure control: controlled Progress toward BP goal:  at goal  Plan: Medications:  continue current medications norvasc 10, lasix 40mg  bid, toprol xl 200mg , and aldactone 25mg 

## 2013-10-21 ENCOUNTER — Ambulatory Visit (INDEPENDENT_AMBULATORY_CARE_PROVIDER_SITE_OTHER): Payer: PRIVATE HEALTH INSURANCE | Admitting: *Deleted

## 2013-10-21 DIAGNOSIS — I428 Other cardiomyopathies: Secondary | ICD-10-CM

## 2013-10-21 LAB — MDC_IDC_ENUM_SESS_TYPE_INCLINIC
Battery Voltage: 2.52 V
Brady Statistic RV Percent Paced: 1 %
Date Time Interrogation Session: 20150119151742
Implantable Pulse Generator Serial Number: 436280
Lead Channel Impedance Value: 550 Ohm
Lead Channel Pacing Threshold Amplitude: 0.5 V
Lead Channel Pacing Threshold Pulse Width: 0.5 ms
Lead Channel Sensing Intrinsic Amplitude: 12.1 mV
Lead Channel Setting Pacing Amplitude: 2.5 V
Lead Channel Setting Pacing Pulse Width: 0.5 ms
Lead Channel Setting Sensing Sensitivity: 0.3 mV
Zone Setting Detection Interval: 250 ms
Zone Setting Detection Interval: 300 ms

## 2013-10-21 NOTE — Progress Notes (Signed)
ICD check in clinic. Normal device function. Thresholds and sensing consistent with previous device measurements. Impedance trends stable over time. No evidence of any ventricular arrhythmias.  Histogram distribution appropriate for patient and level of activity. No changes made this session. Device programmed at appropriate safety margins. Device programmed to optimize intrinsic conduction.  Patient education completed including shock plan. Alert tones/vibration demonstrated for patient. Patient with SJM Atlas ICD. Device battery is <2.55V. Patient to be placed on accelerated follow-up to evaluate for ERI.  ROV 2 months with the device clinic

## 2013-10-26 NOTE — Progress Notes (Signed)
Case discussed with Dr. Qureshi at time of visit.  We reviewed the resident's history and exam and pertinent patient test results.  I agree with the assessment, diagnosis, and plan of care documented in the resident's note. 

## 2013-11-01 ENCOUNTER — Encounter: Payer: Self-pay | Admitting: Internal Medicine

## 2013-11-03 ENCOUNTER — Encounter (HOSPITAL_COMMUNITY): Payer: Self-pay | Admitting: Emergency Medicine

## 2013-11-03 ENCOUNTER — Emergency Department (HOSPITAL_COMMUNITY): Payer: PRIVATE HEALTH INSURANCE

## 2013-11-03 ENCOUNTER — Emergency Department (HOSPITAL_COMMUNITY)
Admission: EM | Admit: 2013-11-03 | Discharge: 2013-11-03 | Disposition: A | Discharge status: Home / Self Care | Payer: PRIVATE HEALTH INSURANCE | Attending: Emergency Medicine | Admitting: Emergency Medicine

## 2013-11-03 DIAGNOSIS — S63259A Unspecified dislocation of unspecified finger, initial encounter: Secondary | ICD-10-CM

## 2013-11-03 DIAGNOSIS — Z95810 Presence of automatic (implantable) cardiac defibrillator: Secondary | ICD-10-CM | POA: Insufficient documentation

## 2013-11-03 DIAGNOSIS — Y9389 Activity, other specified: Secondary | ICD-10-CM | POA: Insufficient documentation

## 2013-11-03 DIAGNOSIS — W108XXA Fall (on) (from) other stairs and steps, initial encounter: Secondary | ICD-10-CM | POA: Insufficient documentation

## 2013-11-03 DIAGNOSIS — F17200 Nicotine dependence, unspecified, uncomplicated: Secondary | ICD-10-CM | POA: Insufficient documentation

## 2013-11-03 DIAGNOSIS — Y929 Unspecified place or not applicable: Secondary | ICD-10-CM | POA: Insufficient documentation

## 2013-11-03 DIAGNOSIS — J4489 Other specified chronic obstructive pulmonary disease: Secondary | ICD-10-CM | POA: Insufficient documentation

## 2013-11-03 DIAGNOSIS — IMO0002 Reserved for concepts with insufficient information to code with codable children: Secondary | ICD-10-CM | POA: Insufficient documentation

## 2013-11-03 DIAGNOSIS — E669 Obesity, unspecified: Secondary | ICD-10-CM | POA: Insufficient documentation

## 2013-11-03 DIAGNOSIS — Z780 Asymptomatic menopausal state: Secondary | ICD-10-CM | POA: Insufficient documentation

## 2013-11-03 DIAGNOSIS — Z9889 Other specified postprocedural states: Secondary | ICD-10-CM | POA: Insufficient documentation

## 2013-11-03 DIAGNOSIS — I5042 Chronic combined systolic (congestive) and diastolic (congestive) heart failure: Secondary | ICD-10-CM | POA: Insufficient documentation

## 2013-11-03 DIAGNOSIS — Z872 Personal history of diseases of the skin and subcutaneous tissue: Secondary | ICD-10-CM | POA: Insufficient documentation

## 2013-11-03 DIAGNOSIS — E785 Hyperlipidemia, unspecified: Secondary | ICD-10-CM | POA: Insufficient documentation

## 2013-11-03 DIAGNOSIS — F329 Major depressive disorder, single episode, unspecified: Secondary | ICD-10-CM | POA: Insufficient documentation

## 2013-11-03 DIAGNOSIS — J449 Chronic obstructive pulmonary disease, unspecified: Secondary | ICD-10-CM | POA: Insufficient documentation

## 2013-11-03 DIAGNOSIS — S60229A Contusion of unspecified hand, initial encounter: Secondary | ICD-10-CM | POA: Insufficient documentation

## 2013-11-03 DIAGNOSIS — W19XXXA Unspecified fall, initial encounter: Secondary | ICD-10-CM

## 2013-11-03 DIAGNOSIS — E119 Type 2 diabetes mellitus without complications: Secondary | ICD-10-CM | POA: Insufficient documentation

## 2013-11-03 DIAGNOSIS — I10 Essential (primary) hypertension: Secondary | ICD-10-CM | POA: Insufficient documentation

## 2013-11-03 DIAGNOSIS — Z79899 Other long term (current) drug therapy: Secondary | ICD-10-CM | POA: Insufficient documentation

## 2013-11-03 DIAGNOSIS — F3289 Other specified depressive episodes: Secondary | ICD-10-CM | POA: Insufficient documentation

## 2013-11-03 DIAGNOSIS — Z7982 Long term (current) use of aspirin: Secondary | ICD-10-CM | POA: Insufficient documentation

## 2013-11-03 DIAGNOSIS — Z794 Long term (current) use of insulin: Secondary | ICD-10-CM | POA: Insufficient documentation

## 2013-11-03 MED ORDER — BUPIVACAINE HCL (PF) 0.5 % IJ SOLN
10.0000 mL | Freq: Once | INTRAMUSCULAR | Status: AC
  Administered 2013-11-03: 10 mL
  Filled 2013-11-03: qty 10

## 2013-11-03 MED ORDER — HYDROCODONE-ACETAMINOPHEN 5-325 MG PO TABS: 1.0000 | ORAL_TABLET | Freq: Four times a day (QID) | ORAL | Status: DC | PRN

## 2013-11-03 MED ORDER — LIDOCAINE HCL (PF) 1 % IJ SOLN
5.0000 mL | Freq: Once | INTRAMUSCULAR | Status: AC
  Administered 2013-11-03: 5 mL
  Filled 2013-11-03: qty 5

## 2013-11-03 MED ORDER — HYDROCODONE-ACETAMINOPHEN 5-325 MG PO TABS
1.0000 | ORAL_TABLET | Freq: Once | ORAL | Status: AC
  Administered 2013-11-03: 1 via ORAL
  Filled 2013-11-03: qty 1

## 2013-11-03 NOTE — ED Notes (Signed)
Ortho paged and responded; Baker Janus, NP asked that ortho come in approximately 30 minutes and splint pt's middle finger on left hand after placing pt's dislocated middle finger on left hand.

## 2013-11-03 NOTE — ED Notes (Signed)
Pt gave permission to cut rings off finger after attempting to take rings off by using surgi-lube to remove from finger.  3 rings were cut off patient's left hand ring finger and remains given back to patient in specimen cup

## 2013-11-03 NOTE — ED Notes (Signed)
Ortho at bedside.

## 2013-11-03 NOTE — Progress Notes (Signed)
Orthopedic Tech Progress Note Patient Details:  [name] 02/09/1951 [id number] Finger splint applied in a neutral position to Left 3rd digit. Application tolerated well.  Ortho Devices Type of Ortho Device: Finger splint Ortho Device/Splint Location: Left 3rd digit Ortho Device/Splint Interventions: Application   Asia R Thompson 11/03/2013, 3:34 PM

## 2013-11-03 NOTE — ED Notes (Signed)
Pt given an ice pack  °

## 2013-11-03 NOTE — Discharge Instructions (Signed)
Hand elevated as much as possible for the next several days.  Ice for 15-20 minutes at a time.  Make sure to put a barrier between the ice pack and your skin.  U. dislocated finger was relocated without problem.  That has been placed in a splint.  I would like you to make appointment Dr. Lenon Curt to have this reexamined in several days to week.  Please wear the splint, at all times, until his examination occurs

## 2013-11-03 NOTE — ED Notes (Signed)
Ice was applied to affected area at triage

## 2013-11-03 NOTE — ED Notes (Signed)
Left hand pain and swelling since tripping and falling down 2 steps on Friday evening. No other pain noted. Pt has rings on left ring finger.

## 2013-11-03 NOTE — ED Provider Notes (Signed)
CSN: [id number]     Arrival date & time 11/03/13  1315 History  This chart was scribed for Peggy Creamer, NP, working with Idelia Salm, MD, by Sydell Axon, ED Scribe. This patient was seen in room TR09C/TR09C and the patient's care was started at 1:59 PM.   Chief Complaint  Patient presents with  . Hand Pain   The history is provided by the patient. No language interpreter was used.   HPI Comments: [name] is a 63 y.o. female who presents to the Emergency Department complaining of diffuse L hand pain, bruising and swelling after she fell down two steps two days ago. She states that in attempting to break her fall, she placed her weight on her L hand. She does not recall landing with her palm or back of her hand. Patient also noticed her L middle finger was distorted. She denies any other pain. Patient reports attempting to use alcohol and epsom salt to reduce the swelling. She has not taken any pain medication. Her PCP is "Dr. Barnetta Chapel" in Internal Medicine. Patient is allergic to avandia, hydrocortisone, and pioglitazone.   Past Medical History  Diagnosis Date  . Hyperthyroidism, subclinical   . Post menopausal syndrome   . Obesity   . Cardiac defibrillator in situ     Atlas II VR (SJM) implanted by Dr Lovena Le  . Eczema   . Lipoma   . Chronic ulcer of leg   . Hyperlipidemia   . Chronic combined systolic and diastolic heart failure     a. EF 35-40% in past;  b. Echo 7/13:  EF 45-50%, Gr 2 diast dysfn, mild AI, mild MAC, trivial MR, mild LAE, PASP 47.  [name] NICM (nonischemic cardiomyopathy)   . Diabetes mellitus   . HTN (hypertension)   . Elevated alkaline phosphatase level     GGT and 5'nucleotidase 8/13 normal  . Hx of cardiac cath     a. [place] 2003 normal;  b. LHC 6/13:  Mild calcification in the LM, o/w normal coronary arteries, EF 45%.   . Sleep apnea     pt denies 04/12/2013  . COPD (chronic obstructive pulmonary disease)     O2 at night  . Automatic implantable  cardioverter-defibrillator in situ   . CHF (congestive heart failure)   . Depression    Past Surgical History  Procedure Laterality Date  . Cardiac defibrillator placement  05/04/2007    SJM Atlas II VR ICD  . Hysteroscopy    . Cardiac defibrillator placement    . Abdominal hysterectomy    . Cardiac catheterization    . Insert / replace / remove pacemaker    . Tubal ligation    . Hernia repair    . Colonoscopy N/A 04/12/2013    Procedure: COLONOSCOPY;  Surgeon: Beryle Beams, MD;  Location: [facility] ENDOSCOPY;  Service: Endoscopy;  Laterality: N/A;  pt.has defibrilator   Family History  Problem Relation Age of Onset  . Stroke Mother   . Seizures Father   . Diabetes Sister   . Asthma Maternal Aunt     aunts  . Asthma Maternal Uncle     uncles  . Heart disease Father   . Heart disease Paternal Aunt     aunts  . Heart disease Paternal Uncle     uncles  . Heart disease Maternal Aunt     aunts  . Heart disease Maternal Uncle     uncles  . Heart disease Maternal Grandfather  History  Substance Use Topics  . Smoking status: Light Tobacco Smoker -- 0.25 packs/day for 35 years    Types: Cigarettes  . Smokeless tobacco: Not on file     Comment: currently smoking 5 cigs daily or less.  . Alcohol Use: No   OB History   Grav Para Term Preterm Abortions TAB SAB Ect Mult Living                 Review of Systems  Musculoskeletal:       L hand pain, bruising, and welling diffusely. L middle finger deformity.  All other systems reviewed and are negative.   Allergies  Avandia; Hydrocortisone; and Pioglitazone  Home Medications   Current Outpatient Rx  Name  Route  Sig  Dispense  Refill  . albuterol (VENTOLIN HFA) 108 (90 BASE) MCG/ACT inhaler   Inhalation   Inhale 2 puffs into the lungs every 6 (six) hours as needed. Shortness of breath   1 Inhaler   6   . amLODipine (NORVASC) 10 MG tablet   Oral   Take 1 tablet (10 mg total) by mouth daily.   30 tablet   11   .  aspirin 81 MG tablet   Oral   Take 1 tablet (81 mg total) by mouth daily.   30 tablet   11   . atorvastatin (LIPITOR) 80 MG tablet   Oral   Take 1 tablet (80 mg total) by mouth at bedtime.   30 tablet   11   . citalopram (CELEXA) 20 MG tablet   Oral   Take 1 tablet (20 mg total) by mouth daily.   30 tablet   11   . Fluticasone-Salmeterol (ADVAIR) 250-50 MCG/DOSE AEPB   Inhalation   Inhale 1 puff into the lungs every 12 (twelve) hours.   60 each   11   . furosemide (LASIX) 40 MG tablet   Oral   Take 1 tablet (40 mg total) by mouth 2 (two) times daily.   45 tablet   11   . gabapentin (NEURONTIN) 300 MG capsule   Oral   Take 1 capsule (300 mg total) by mouth at bedtime.   30 capsule   11   . glucose blood (ONE TOUCH ULTRA TEST) test strip      Use as instructed by doctor up to 3x daily,dx code 250.02 insulin requiring   100 each   5   . HYDROcodone-acetaminophen (NORCO/VICODIN) 5-325 MG per tablet   Oral   Take 1 tablet by mouth every 6 (six) hours as needed for moderate pain.   12 tablet   0   . insulin glargine (LANTUS) 100 UNIT/ML injection   Subcutaneous   Inject 44 Units into the skin daily with breakfast.         . insulin lispro (HUMALOG) 100 UNIT/ML injection      Inject 8 units with breakfast and 12 units with lunch and 10 units with dinner   10 mL   5   . ketoconazole (NIZORAL) 2 % shampoo   Topical   Apply 1 application topically 3 (three) times a week.   120 mL   3   . lisinopril (PRINIVIL,ZESTRIL) 40 MG tablet   Oral   Take 1 tablet (40 mg total) by mouth daily.   30 tablet   11   . metoprolol (TOPROL-XL) 200 MG 24 hr tablet   Oral   Take 1 tablet (200 mg total) by mouth daily.  30 tablet   11   . nitroGLYCERIN (NITROSTAT) 0.4 MG SL tablet   Sublingual   Place 1 tablet (0.4 mg total) under the tongue every 5 (five) minutes as needed for chest pain.   25 tablet   3   . spironolactone (ALDACTONE) 25 MG tablet   Oral   Take  1 tablet (25 mg total) by mouth daily.   30 tablet   11   . triamcinolone (KENALOG) 0.1 % ointment   Topical   Apply 1 application topically as needed. rash          Triage Vitals: BP 141/82  Pulse 95  Temp(Src) 97.3 F (36.3 C)  Resp 18  SpO2 96%  Physical Exam  Nursing note and vitals reviewed. Musculoskeletal:  Deformity of L middle finger noted. Diffuse bruising and swelling to both sides of L hand.   ED Course  ORTHOPEDIC INJURY TREATMENT Date/Time: 11/03/2013 3:15 PM Performed by: Garald Balding Authorized by: Garald Balding Consent: Verbal consent obtained. written consent not obtained. Risks and benefits: risks, benefits and alternatives were discussed Consent given by: patient Patient understanding: patient states understanding of the procedure being performed Patient identity confirmed: verbally with patient Injury location: finger Location details: left long finger Injury type: fracture-dislocation Fracture type: middle phalanx MCP joint involved: no Any IP joint involved: yes Pre-procedure neurovascular assessment: neurovascularly intact Pre-procedure distal perfusion: normal Pre-procedure neurological function: normal Local anesthesia used: no Manipulation performed: yes Skin traction used: no Skeletal traction used: yes Reduction successful: yes Immobilization: splint Splint type: dynamic finger   (including critical care time)  COORDINATION OF CARE: 2:04 PM-Ordered pain medication and L hand xray. Will F/U patient following xray results. Treatment plan discussed with patient and patient agrees.  2:32 PM-Discussed x-ray findings with patient.   2:51 PM-Injected 76mL marcaine 0.5%. Patient tolerated injection well.    3:15 PM- L middle finger relocated. Patient tolerated treatment well.    Labs Review Labs Reviewed - No data to display Imaging Review Dg Hand Complete Left  11/03/2013   CLINICAL DATA:  [place] [address] 2 days ago with bruising third  metacarpal and diffuse hand and wrist swelling, patient indicates pain in palm  EXAM: LEFT HAND - COMPLETE 3+ VIEW  COMPARISON:  None.  FINDINGS: There is as dorsal and ulnar dislocation of the third middle phalanx on the proximal phalanx apparent there is near complete if not complete dorsal dislocation. There is also a small oval fracture fragment along the volar surface at the base of the middle phalanx suggesting a fracture.  There are no other acute abnormalities. There is no significant focal osseous abnormality otherwise.  IMPRESSION: Dislocation of the third middle phalanx on the third proximal phalanx associated with tiny fracture fragment.   Electronically Signed   By: Skipper Cliche M.D.   On: 11/03/2013 14:22   EKG Interpretation   None       MDM   1. Fall   2. Hand contusion   3. Finger dislocation       I personally performed the services described in this documentation, which was scribed in my presence. The recorded information has been reviewed and is accurate.   Garald Balding, NP 11/03/13 [phone]

## 2013-11-03 NOTE — ED Provider Notes (Signed)
Medical screening examination/treatment/procedure(s) were performed by non-physician practitioner and as supervising physician I was immediately available for consultation/collaboration.  EKG Interpretation   None         Mariea Clonts, MD 11/03/13 [phone]

## 2013-11-04 ENCOUNTER — Other Ambulatory Visit: Payer: Self-pay | Admitting: Internal Medicine

## 2013-11-05 ENCOUNTER — Other Ambulatory Visit: Payer: Self-pay | Admitting: Internal Medicine

## 2013-11-05 NOTE — Telephone Encounter (Signed)
She has 11 refills since April 2014. Has the pharmacy changed for the request??? Otherwise she should not need anyore

## 2013-11-05 NOTE — Telephone Encounter (Signed)
Called pharmacy, the pharmacist did not know what happened so he has reopened refill and will be done thru end of april

## 2013-11-05 NOTE — Telephone Encounter (Signed)
I just responded to this with [name]. 11 refills were given April 2014. Does she have a different pharmacy so that is why its needed?

## 2013-11-14 ENCOUNTER — Telehealth: Payer: Self-pay | Admitting: *Deleted

## 2013-11-14 NOTE — Telephone Encounter (Signed)
Yes.  If pt is homebound, we can set her up with [facility] RN but it will only be daily. I have not had a chance to review the chart only her payor source.  If physician agrees for [facility], would need F2F and referral.

## 2013-11-14 NOTE — Telephone Encounter (Signed)
I tried calling [name] at home but no answer. If she can qualify for home health I am okay with that. How can we verify if she does qualify? If so, let me know and I will place order. In the meantime, can we try to contact her again, confirm where and when surgery was done as well?  Thanks,  Dr q

## 2013-11-14 NOTE — Telephone Encounter (Signed)
Er visit 2/1 for L 3rd finger dislocation. There is no EPIC surgery note although could have been outside of EPIC. I doubt a finger dislocation would qualify pt as home bound but will send to Dr Q who is PCP.

## 2013-11-14 NOTE — Telephone Encounter (Signed)
Pt calls and states she had surgery on her hand yesterday and was discharged home, she is unable to use her surgical hand, cannot check blood sugar nor give her insulin due to this, is there something we can do like HH?

## 2013-11-14 NOTE — Telephone Encounter (Signed)
Lantus is not supposed to be predrawn earlier than the time of injection, but maybe we could do this short term if she is unable to get home health. I will follow up with social work.

## 2013-11-14 NOTE — Telephone Encounter (Signed)
[name] was able to call me back this morning and reports that she hand hand surgery with Dr. Lenon Curt yesterday and her hand is currently wrapped up with only her thumb exposed.  She says she has a follow up appointment next week. She has not been able to check her blood sugar or take insulin since then due to her hand being wrapped. She is able to move her hand. I recommended for now checking on her thumb which is exposed and since she is right handed she will try to see if she can draw up her insulin. If not, we will try to get some assistance if possible to help her during this transition time as she needs her insulin. i will also ask CDE and social work to talk with patient and see if they can assist her in instructions and in arranging home services if she can qualify. She said she will check on her thumb and let us know her cbg and if she can draw up the insulin. No family available to help for now as they are all at work.

## 2013-11-14 NOTE — Telephone Encounter (Signed)
Also, she needs to check her sugars more often than just once, is there anything we can do to help her with checking her sugars and giving insulin? If up to three times a day?

## 2013-11-15 ENCOUNTER — Telehealth: Payer: Self-pay | Admitting: Dietician

## 2013-11-15 NOTE — Telephone Encounter (Signed)
Called patient: she says she figured it out and is doing fine. Drawing up insulin by holding vial between her legs and checking blood sugar on her thumb. Her Cbgs was 102 this am. Made a pot of soups and microwaves it for her meals. Says her daughter is a Marine scientist at [employer] and comes by to chel on her. She'll call us if needed.

## 2013-11-15 NOTE — Telephone Encounter (Signed)
Thank you! I am out of the hospital today but if she is having trouble, she may need to come to clinic to have everything set up in the meantime perhaps? If home health is not available. If the order is needed once approved, please have clinic attending place as I dont think i can place the order prior to finding out if approved.

## 2013-11-20 NOTE — Telephone Encounter (Signed)
Pt denies need for any home health services.  Ms. Cotterman states per PCP instruction, she has been using her thumb.

## 2013-12-03 ENCOUNTER — Other Ambulatory Visit: Payer: Self-pay | Admitting: Internal Medicine

## 2013-12-19 ENCOUNTER — Ambulatory Visit (INDEPENDENT_AMBULATORY_CARE_PROVIDER_SITE_OTHER): Payer: PRIVATE HEALTH INSURANCE | Admitting: *Deleted

## 2013-12-19 DIAGNOSIS — I5042 Chronic combined systolic (congestive) and diastolic (congestive) heart failure: Secondary | ICD-10-CM

## 2013-12-19 LAB — MDC_IDC_ENUM_SESS_TYPE_INCLINIC
Battery Voltage: 2.52 V
Brady Statistic RV Percent Paced: 1 %
Date Time Interrogation Session: 20150319110207
Implantable Pulse Generator Serial Number: 436280
Lead Channel Impedance Value: 480 Ohm
Lead Channel Pacing Threshold Amplitude: 0.5 V
Lead Channel Pacing Threshold Pulse Width: 0.5 ms
Lead Channel Sensing Intrinsic Amplitude: 12.1 mV
Lead Channel Setting Pacing Amplitude: 2.5 V
Lead Channel Setting Pacing Pulse Width: 0.5 ms
Lead Channel Setting Sensing Sensitivity: 0.3 mV
Zone Setting Detection Interval: 250 ms
Zone Setting Detection Interval: 300 ms

## 2013-12-19 NOTE — Progress Notes (Signed)
ICD check in clinic. Normal device function. Thresholds and sensing consistent with previous device measurements. Impedance trends stable over time. No evidence of any ventricular arrhythmias. No mode switches. Histogram distribution appropriate for patient and level of activity. No changes made this session. Device programmed at appropriate safety margins. Device programmed to optimize intrinsic conduction.  Patient education completed including shock plan. Alert tones/vibration demonstrated for patient. Patient with SJM Atlas ICD. Device battery  is <2.55V. Patient to be placed on accelerated follow-up to evaluate for ERI.  ROV 2 months with the device clinic.

## 2013-12-31 ENCOUNTER — Encounter: Payer: Self-pay | Admitting: Internal Medicine

## 2014-01-27 ENCOUNTER — Encounter (INDEPENDENT_AMBULATORY_CARE_PROVIDER_SITE_OTHER): Payer: Self-pay

## 2014-01-27 ENCOUNTER — Ambulatory Visit (INDEPENDENT_AMBULATORY_CARE_PROVIDER_SITE_OTHER): Payer: PRIVATE HEALTH INSURANCE | Admitting: Cardiology

## 2014-01-27 ENCOUNTER — Encounter: Payer: Self-pay | Admitting: Cardiology

## 2014-01-27 VITALS — BP 148/96 | HR 81 | Ht 60.0 in | Wt 233.8 lb

## 2014-01-27 DIAGNOSIS — I5042 Chronic combined systolic (congestive) and diastolic (congestive) heart failure: Secondary | ICD-10-CM

## 2014-01-27 DIAGNOSIS — I10 Essential (primary) hypertension: Secondary | ICD-10-CM

## 2014-01-27 DIAGNOSIS — F17200 Nicotine dependence, unspecified, uncomplicated: Secondary | ICD-10-CM

## 2014-01-27 DIAGNOSIS — E785 Hyperlipidemia, unspecified: Secondary | ICD-10-CM

## 2014-01-27 DIAGNOSIS — Z95810 Presence of automatic (implantable) cardiac defibrillator: Secondary | ICD-10-CM

## 2014-01-27 DIAGNOSIS — I428 Other cardiomyopathies: Secondary | ICD-10-CM

## 2014-01-27 NOTE — Assessment & Plan Note (Signed)
Patient counseled on discontinuing. 

## 2014-01-27 NOTE — Assessment & Plan Note (Signed)
Continue ACE inhibitor and beta blocker. 

## 2014-01-27 NOTE — Assessment & Plan Note (Signed)
Continue statin. 

## 2014-01-27 NOTE — Assessment & Plan Note (Signed)
Followed by electrophysiology. 

## 2014-01-27 NOTE — Progress Notes (Signed)
HPI: FU chronic combined systolic and diastolic CHF. She has a hx of NICM with a pror EF of 35-40%, HTN, HL, DM2, s/p AICD implantation. Myoview 2008: no ischemia, EF 33%. LHC 6/13: Mild calcification in the LM, o/w normal coronary arteries, EF 45%. Echo 7/13: EF 45-50%, Gr 2 diast dysfn, mild AI, mild MAC, trivial MR, mild LAE, PASP 47. Since I last saw her, she has mild dyspnea on exertion but no orthopnea, PND, palpitations, chest pain or syncope. Occasional mild pedal edema.   Current Outpatient Prescriptions  Medication Sig Dispense Refill  . albuterol (VENTOLIN HFA) 108 (90 BASE) MCG/ACT inhaler Inhale 2 puffs into the lungs every 6 (six) hours as needed. Shortness of breath  1 Inhaler  6  . amLODipine (NORVASC) 10 MG tablet Take 1 tablet (10 mg total) by mouth daily.  30 tablet  11  . aspirin 81 MG tablet Take 1 tablet (81 mg total) by mouth daily.  30 tablet  11  . atorvastatin (LIPITOR) 80 MG tablet Take 1 tablet (80 mg total) by mouth at bedtime.  30 tablet  11  . citalopram (CELEXA) 20 MG tablet Take 1 tablet (20 mg total) by mouth daily.  30 tablet  11  . Fluticasone-Salmeterol (ADVAIR) 250-50 MCG/DOSE AEPB Inhale 1 puff into the lungs every 12 (twelve) hours.  60 each  11  . furosemide (LASIX) 40 MG tablet Take 1 tablet (40 mg total) by mouth 2 (two) times daily.  45 tablet  11  . gabapentin (NEURONTIN) 300 MG capsule Take 1 capsule (300 mg total) by mouth at bedtime.  30 capsule  11  . glucose blood (ONE TOUCH ULTRA TEST) test strip Use as instructed by doctor up to 3x daily,dx code 250.02 insulin requiring  100 each  5  . HYDROcodone-acetaminophen (NORCO/VICODIN) 5-325 MG per tablet Take 1 tablet by mouth every 6 (six) hours as needed for moderate pain.  12 tablet  0  . insulin glargine (LANTUS) 100 UNIT/ML injection Inject 44 Units into the skin daily with breakfast.      . insulin lispro (HUMALOG) 100 UNIT/ML injection Inject 8 units with breakfast and 12 units with lunch  and 10 units with dinner  10 mL  5  . ketoconazole (NIZORAL) 2 % shampoo Apply 1 application topically 3 (three) times a week.  120 mL  3  . lisinopril (PRINIVIL,ZESTRIL) 40 MG tablet Take 1 tablet (40 mg total) by mouth daily.  30 tablet  11  . metoprolol (TOPROL-XL) 200 MG 24 hr tablet Take 1 tablet (200 mg total) by mouth daily.  30 tablet  11  . nitroGLYCERIN (NITROSTAT) 0.4 MG SL tablet Place 1 tablet (0.4 mg total) under the tongue every 5 (five) minutes as needed for chest pain.  25 tablet  3  . spironolactone (ALDACTONE) 25 MG tablet Take 1 tablet (25 mg total) by mouth daily.  30 tablet  11  . triamcinolone (KENALOG) 0.1 % ointment Apply 1 application topically as needed. rash       No current facility-administered medications for this visit.     Past Medical History  Diagnosis Date  . Hyperthyroidism, subclinical   . Post menopausal syndrome   . Obesity   . Cardiac defibrillator in situ     Atlas II VR (SJM) implanted by Dr Lovena Le  . Eczema   . Lipoma   . Chronic ulcer of leg   . Hyperlipidemia   . Chronic combined systolic and  diastolic heart failure     a. EF 35-40% in past;  b. Echo 7/13:  EF 45-50%, Gr 2 diast dysfn, mild AI, mild MAC, trivial MR, mild LAE, PASP 47.  [name] NICM (nonischemic cardiomyopathy)   . Diabetes mellitus   . HTN (hypertension)   . Elevated alkaline phosphatase level     GGT and 5'nucleotidase 8/13 normal  . Hx of cardiac cath     a. [place] 2003 normal;  b. LHC 6/13:  Mild calcification in the LM, o/w normal coronary arteries, EF 45%.   . Sleep apnea     pt denies 04/12/2013  . COPD (chronic obstructive pulmonary disease)     O2 at night  . Automatic implantable cardioverter-defibrillator in situ   . CHF (congestive heart failure)   . Depression     Past Surgical History  Procedure Laterality Date  . Cardiac defibrillator placement  05/04/2007    SJM Atlas II VR ICD  . Hysteroscopy    . Cardiac defibrillator placement    . Abdominal  hysterectomy    . Cardiac catheterization    . Insert / replace / remove pacemaker    . Tubal ligation    . Hernia repair    . Colonoscopy N/A 04/12/2013    Procedure: COLONOSCOPY;  Surgeon: Beryle Beams, MD;  Location: [facility];  Service: Endoscopy;  Laterality: N/A;  pt.has defibrilator    History   Social History  . Marital Status: Widowed    Spouse Name: Alroy Dust    Number of Children: N/A  . Years of Education: N/A   Occupational History  . disabled    Social History Main Topics  . Smoking status: Light Tobacco Smoker -- 0.25 packs/day for 35 years    Types: Cigarettes  . Smokeless tobacco: Not on file     Comment: currently smoking 5 cigs daily or less.  . Alcohol Use: No  . Drug Use: No  . Sexual Activity: Not on file   Other Topics Concern  . Not on file   Social History Narrative   ** Merged History Encounter **       Married    ROS: no fevers or chills, productive cough, hemoptysis, dysphasia, odynophagia, melena, hematochezia, dysuria, hematuria, rash, seizure activity, orthopnea, PND, pedal edema, claudication. Remaining systems are negative.  Physical Exam: Well-developed obese in no acute distress.  Skin is warm and dry.  HEENT is normal.  Neck is supple.  Chest is clear to auscultation with normal expansion.  Cardiovascular exam is regular rate and rhythm.  Abdominal exam nontender or distended. No masses palpated. Extremities show trace edema. neuro grossly intact  ECG Normal sinus rhythm at a rate of 81.Normal axis. Lateral T-wave inversion. Prolonged QT interval. Left atrial enlargement.

## 2014-01-27 NOTE — Patient Instructions (Signed)
Your physician wants you to follow-up in: 6 MONTHS WITH DR CRENSHAW You will receive a reminder letter in the mail two months in advance. If you don't receive a letter, please call our office to schedule the follow-up appointment.   Your physician recommends that you HAVE LAB WORK TODAY 

## 2014-01-27 NOTE — Assessment & Plan Note (Signed)
Blood pressure elevated. However she has not taken her medications yet this morning. She will follow her blood pressure at home and we will add medications as needed. I would most likely add hydralazine if not controlled on present regimen.

## 2014-01-27 NOTE — Assessment & Plan Note (Signed)
Continue present dose of Lasix and spironolactone. Check potassium and renal function.

## 2014-01-28 LAB — BASIC METABOLIC PANEL
BUN: 14 mg/dL (ref 6–23)
CO2: 29 mEq/L (ref 19–32)
Calcium: 9.4 mg/dL (ref 8.4–10.5)
Chloride: 101 mEq/L (ref 96–112)
Creatinine, Ser: 0.8 mg/dL (ref 0.4–1.2)
GFR: 99.72 mL/min (ref >60.00)
Glucose, Bld: 279 mg/dL — ABNORMAL HIGH (ref 70–99)
Potassium: 4.5 mEq/L (ref 3.5–5.1)
Sodium: 139 mEq/L (ref 135–145)

## 2014-02-04 ENCOUNTER — Encounter: Payer: Self-pay | Admitting: Internal Medicine

## 2014-02-19 ENCOUNTER — Ambulatory Visit (INDEPENDENT_AMBULATORY_CARE_PROVIDER_SITE_OTHER): Payer: PRIVATE HEALTH INSURANCE | Admitting: *Deleted

## 2014-02-19 ENCOUNTER — Encounter: Payer: Self-pay | Admitting: Internal Medicine

## 2014-02-19 DIAGNOSIS — Z4502 Encounter for adjustment and management of automatic implantable cardiac defibrillator: Secondary | ICD-10-CM

## 2014-02-19 LAB — MDC_IDC_ENUM_SESS_TYPE_INCLINIC
Battery Voltage: 2.5 V
Brady Statistic RV Percent Paced: 1 % — CL
Implantable Pulse Generator Serial Number: 436280
Lead Channel Impedance Value: 495 Ohm
Lead Channel Sensing Intrinsic Amplitude: 10.8 mV

## 2014-02-19 NOTE — Progress Notes (Signed)
Battery check only. Voltage @2 .5V, ERI = 2.45V. ROV w/ device clinic 03/24/14 for next check (billable).

## 2014-03-11 ENCOUNTER — Other Ambulatory Visit: Payer: Self-pay | Admitting: Internal Medicine

## 2014-03-24 ENCOUNTER — Ambulatory Visit (INDEPENDENT_AMBULATORY_CARE_PROVIDER_SITE_OTHER): Payer: Commercial Managed Care - HMO | Admitting: *Deleted

## 2014-03-24 DIAGNOSIS — I428 Other cardiomyopathies: Secondary | ICD-10-CM

## 2014-03-24 DIAGNOSIS — I5042 Chronic combined systolic (congestive) and diastolic (congestive) heart failure: Secondary | ICD-10-CM

## 2014-03-24 LAB — MDC_IDC_ENUM_SESS_TYPE_INCLINIC
Battery Voltage: 2.45 V
Brady Statistic RV Percent Paced: 0 %
Date Time Interrogation Session: 20150622101412
Implantable Pulse Generator Serial Number: 436280
Lead Channel Impedance Value: 520 Ohm
Lead Channel Pacing Threshold Amplitude: 0.5 V
Lead Channel Pacing Threshold Pulse Width: 0.5 ms
Lead Channel Sensing Intrinsic Amplitude: 12.1 mV
Lead Channel Setting Pacing Amplitude: 2.5 V
Lead Channel Setting Pacing Pulse Width: 0.5 ms
Lead Channel Setting Sensing Sensitivity: 0.3 mV
Zone Setting Detection Interval: 250 ms
Zone Setting Detection Interval: 300 ms

## 2014-03-24 NOTE — Progress Notes (Signed)
ICD check in clinic. Normal device function. Threshold and sensing consistent with previous device measurements. Impedance trends stable over time. No evidence of any ventricular arrhythmias. Histogram distribution appropriate for patient and level of activity. No changes made this session. Device programmed at appropriate safety margins. Device programmed to optimize intrinsic conduction. ERI reached. Plan to follow up with Ileene Hutchinson, PA on 6-23 @ 10:00am.

## 2014-03-24 NOTE — Progress Notes (Signed)
ELECTROPHYSIOLOGY OFFICE NOTE   Patient ID: [name] MRN: [id number], DOB/AGE: [birth year]   Date of Visit: 03/25/2014  Primary Physician: Jerene Pitch, MD Primary Cardiologist: Stanford Breed, MD Primary EP: Lovena Le, MD Reason for Visit: EP/device follow-up  History of Present Illness  [name] is a 63 y.o. female with NICM s/p ICD implant for primary prevention SCD, chronic combined systolic and diastolic HF, DM, OSA and COPD who presents today for routine electrophysiology followup. Her ICD battery is at [facility]. Since last being seen in our clinic, she reports she has no complaints. She has chronic DOE. She denies chest pain. She denies palpitations, dizziness, near syncope or syncope. She denies LE swelling, orthopnea or PND. She is compliant and tolerating medications without difficulty.  Past Medical History Past Medical History  Diagnosis Date  . Hyperthyroidism, subclinical   . Post menopausal syndrome   . Obesity   . Cardiac defibrillator in situ     Atlas II VR (SJM) implanted by Dr Lovena Le  . Eczema   . Lipoma   . Chronic ulcer of leg   . Hyperlipidemia   . Chronic combined systolic and diastolic heart failure     a. EF 35-40% in past;  b. Echo 7/13:  EF 45-50%, Gr 2 diast dysfn, mild AI, mild MAC, trivial MR, mild LAE, PASP 47.  [name] NICM (nonischemic cardiomyopathy)   . Diabetes mellitus   . HTN (hypertension)   . Elevated alkaline phosphatase level     GGT and 5'nucleotidase 8/13 normal  . Hx of cardiac cath     a. [place] 2003 normal;  b. LHC 6/13:  Mild calcification in the LM, o/w normal coronary arteries, EF 45%.   . Sleep apnea     pt denies 04/12/2013  . COPD (chronic obstructive pulmonary disease)     O2 at night  . Automatic implantable cardioverter-defibrillator in situ   . CHF (congestive heart failure)   . Depression   . Implantable cardioverter-defibrillator (ICD) generator end of life     Past Surgical History Past Surgical History  Procedure  Laterality Date  . Cardiac defibrillator placement  05/04/2007    SJM Atlas II VR ICD  . Hysteroscopy    . Cardiac defibrillator placement    . Abdominal hysterectomy    . Cardiac catheterization    . Insert / replace / remove pacemaker    . Tubal ligation    . Hernia repair    . Colonoscopy N/A 04/12/2013    Procedure: COLONOSCOPY;  Surgeon: Beryle Beams, MD;  Location: [facility];  Service: Endoscopy;  Laterality: N/A;  pt.has defibrilator    Allergies/Intolerances Allergies  Allergen Reactions  . Avandia [Rosiglitazone Maleate]     unknown  . Hydrocortisone     unknown  . Pioglitazone     REACTION: congestive heart failure ACTOS    Current Home Medications Current Outpatient Prescriptions  Medication Sig Dispense Refill  . albuterol (VENTOLIN HFA) 108 (90 BASE) MCG/ACT inhaler Inhale 2 puffs into the lungs every 6 (six) hours as needed. Shortness of breath  1 Inhaler  6  . amLODipine (NORVASC) 10 MG tablet Take 1 tablet (10 mg total) by mouth daily.  30 tablet  11  . aspirin 81 MG tablet Take 1 tablet (81 mg total) by mouth daily.  30 tablet  11  . atorvastatin (LIPITOR) 80 MG tablet Take 1 tablet (80 mg total) by mouth at bedtime.  30 tablet  11  . citalopram (CELEXA)  20 MG tablet Take 1 tablet (20 mg total) by mouth daily.  30 tablet  11  . Fluticasone-Salmeterol (ADVAIR) 250-50 MCG/DOSE AEPB Inhale 1 puff into the lungs every 12 (twelve) hours.  60 each  11  . furosemide (LASIX) 40 MG tablet Take 1 tablet (40 mg total) by mouth 2 (two) times daily.  45 tablet  11  . gabapentin (NEURONTIN) 300 MG capsule Take 1 capsule (300 mg total) by mouth at bedtime.  30 capsule  11  . glucose blood (ONE TOUCH ULTRA TEST) test strip Use as instructed by doctor up to 3x daily,dx code 250.02 insulin requiring  100 each  5  . insulin glargine (LANTUS) 100 UNIT/ML injection Inject 44 Units into the skin daily with breakfast.      . insulin lispro (HUMALOG) 100 UNIT/ML injection Inject 8  units with breakfast and 12 units with lunch and 10 units with dinner  10 mL  5  . ketoconazole (NIZORAL) 2 % shampoo Apply 1 application topically 3 (three) times a week.  120 mL  3  . lisinopril (PRINIVIL,ZESTRIL) 40 MG tablet Take 1 tablet (40 mg total) by mouth daily.  30 tablet  11  . metoprolol (TOPROL-XL) 200 MG 24 hr tablet Take 1 tablet (200 mg total) by mouth daily.  30 tablet  11  . spironolactone (ALDACTONE) 25 MG tablet Take 1 tablet (25 mg total) by mouth daily.  30 tablet  11  . triamcinolone (KENALOG) 0.1 % ointment Apply 1 application topically as needed. rash      . nitroGLYCERIN (NITROSTAT) 0.4 MG SL tablet Place 1 tablet (0.4 mg total) under the tongue every 5 (five) minutes as needed for chest pain.  25 tablet  3   No current facility-administered medications for this visit.    Social History History   Social History  . Marital Status: Widowed    Spouse Name: Alroy Dust    Number of Children: N/A  . Years of Education: N/A   Occupational History  . disabled    Social History Main Topics  . Smoking status: Light Tobacco Smoker -- 0.25 packs/day for 35 years    Types: Cigarettes  . Smokeless tobacco: Not on file     Comment: currently smoking 5 cigs daily or less.  . Alcohol Use: No  . Drug Use: No  . Sexual Activity: Not on file   Other Topics Concern  . Not on file   Social History Narrative   ** Merged History Encounter **       Married     Review of Systems General: No chills, fever, night sweats or weight changes Cardiovascular: No chest pain, edema, orthopnea, palpitations, paroxysmal nocturnal dyspnea Dermatological: No rash, lesions or masses Respiratory: No cough Urologic: No hematuria, dysuria Abdominal: No nausea, vomiting, diarrhea, bright red blood per rectum, melena, or hematemesis Neurologic: No visual changes, weakness, changes in mental status All other systems reviewed and are otherwise negative except as noted above.  Physical  Exam Vitals: Blood pressure 164/102, pulse 98, weight 234 lb (106.142 kg).  General: Well developed, well appearing 63 y.o. female in no acute distress. HEENT: Normocephalic, atraumatic. EOMs intact. Sclera nonicteric. Oropharynx clear.  Neck: Supple. No JVD. Lungs: Respirations regular and unlabored, CTA bilaterally. No wheezes, rales or rhonchi. Heart: RRR. S1, S2 present. No murmurs, rub, S3 or S4. Abdomen: Soft, non-distended.  Extremities: No clubbing, cyanosis or edema. DP/PT/Radials 2+ and equal bilaterally. Psych: Normal affect. Neuro: Alert and oriented X 3.  Moves all extremities spontaneously. Skin: Left upper chest / implant site intact and well healed.   Diagnostics  Echocardiogram (most recent July 2013) Study Conclusions - Left ventricle: The cavity size was normal. Wall thickness was normal. Systolic function was mildly reduced. The estimated ejection fraction was in the range of 45% to 50%. Diffuse hypokinesis. Features are consistent with a pseudonormal left ventricular filling pattern, with concomitant abnormal relaxation and increased filling pressure (grade 2 diastolic dysfunction). - Aortic valve: There was no stenosis. Mild regurgitation. - Mitral valve: Mildly calcified annulus. Trivial regurgitation. - Left atrium: The atrium was mildly dilated. - Right ventricle: The cavity size was normal. Pacer wire or catheter noted in right ventricle. Systolic function was normal. - Pulmonary arteries: PA peak pressure: 66mm Hg (S). - Inferior vena cava: The vessel was normal in size; the respirophasic diameter changes were in the normal range (= 50%); findings are consistent with normal central venous pressure. Impression: - Normal LV size with mild global hypokinesis, EF 45-50%. Moderate diastolic dysfunction. Normal RV size and systolic function. Mild pulmonary hypertension.  12-lead ECG today - NSR at 98 bpm; nonspecific T wave abnormalities; QRS duration 80  msec Device interrogation yesterday - Battery at ERI since 02/10/2014. Otherwise normal device function. Threshold, sensing and impedance stable. No VT/VF episodes.   Assessment and Plan ICD battery at [facility] NICM s/p ICD implant for primary prevention of SCD Chronic combined systolic and diastolic HF  Ms. Ozimek's ICD battery has reached ERI. Otherwise device function is normal and no programming changes were made. No VT/VF episodes. We discussed need for ICD generator change and reviewed the procedure, including risks and benefits. Risks, benefits and alternatives to ICD generator change were reviewed in detail. These risks include, but are not limited to, lead dislodgement, bleeding and infection. Ms. Mcneece expressed verbal understanding and agrees to proceed. This will be scheduled with Dr. Lovena Le at the next available time. As required for NCDR ICD registry, will update echo. Of note, she is on guideline-directed medical therapy with metoprolol succinate and ACEI. In addition, her V rate histograms are appropriate for activity level and I do not think she needs an atrial lead at this time. Also, she has a narrow QRS and does not meet criteria for CRT at this time.   Signed, Ileene Hutchinson, PA-C 03/25/2014, 1:38 PM

## 2014-03-25 ENCOUNTER — Encounter: Payer: Self-pay | Admitting: Cardiology

## 2014-03-25 ENCOUNTER — Encounter: Payer: Self-pay | Admitting: *Deleted

## 2014-03-25 ENCOUNTER — Ambulatory Visit (INDEPENDENT_AMBULATORY_CARE_PROVIDER_SITE_OTHER): Payer: Commercial Managed Care - HMO | Admitting: Cardiology

## 2014-03-25 VITALS — BP 164/102 | HR 98 | Wt 234.0 lb

## 2014-03-25 DIAGNOSIS — Z4502 Encounter for adjustment and management of automatic implantable cardiac defibrillator: Secondary | ICD-10-CM | POA: Insufficient documentation

## 2014-03-25 DIAGNOSIS — I428 Other cardiomyopathies: Secondary | ICD-10-CM

## 2014-03-25 DIAGNOSIS — I5042 Chronic combined systolic (congestive) and diastolic (congestive) heart failure: Secondary | ICD-10-CM

## 2014-03-25 DIAGNOSIS — Z95810 Presence of automatic (implantable) cardiac defibrillator: Secondary | ICD-10-CM

## 2014-03-25 LAB — PROTIME-INR
INR: 0.9 ratio (ref 0.8–1.0)
Prothrombin Time: 10.5 s (ref 9.6–13.1)

## 2014-03-25 LAB — CBC WITH DIFFERENTIAL/PLATELET
Basophils Absolute: 0 10*3/uL (ref 0.0–0.1)
Basophils Relative: 0.6 % (ref 0.0–3.0)
Eosinophils Absolute: 0.2 10*3/uL (ref 0.0–0.7)
Eosinophils Relative: 2.5 % (ref 0.0–5.0)
HCT: 43.7 % (ref 36.0–46.0)
Hemoglobin: 14.1 g/dL (ref 12.0–15.0)
Lymphocytes Relative: 34.2 % (ref 12.0–46.0)
Lymphs Abs: 2.5 10*3/uL (ref 0.7–4.0)
MCHC: 32.3 g/dL (ref 30.0–36.0)
MCV: 85.6 fl (ref 78.0–100.0)
Monocytes Absolute: 0.4 10*3/uL (ref 0.1–1.0)
Monocytes Relative: 5.2 % (ref 3.0–12.0)
Neutro Abs: 4.2 10*3/uL (ref 1.4–7.7)
Neutrophils Relative %: 57.5 % (ref 43.0–77.0)
Platelets: 200 10*3/uL (ref 150.0–400.0)
RBC: 5.1 Mil/uL (ref 3.87–5.11)
RDW: 15.8 % — ABNORMAL HIGH (ref 11.5–15.5)
WBC: 7.3 10*3/uL (ref 4.0–10.5)

## 2014-03-25 LAB — BASIC METABOLIC PANEL
BUN: 9 mg/dL (ref 6–23)
CO2: 24 mEq/L (ref 19–32)
Calcium: 9 mg/dL (ref 8.4–10.5)
Chloride: 102 mEq/L (ref 96–112)
Creatinine, Ser: 0.9 mg/dL (ref 0.4–1.2)
GFR: 86.42 mL/min (ref >60.00)
Glucose, Bld: 479 mg/dL — ABNORMAL HIGH (ref 70–99)
Potassium: 3.4 mEq/L — ABNORMAL LOW (ref 3.5–5.1)
Sodium: 137 mEq/L (ref 135–145)

## 2014-03-25 NOTE — Patient Instructions (Addendum)
Your physician has requested that you have an echocardiogram. Echocardiography is a painless test that uses sound waves to create images of your heart. It provides your doctor with information about the size and shape of your heart and how well your heart's chambers and valves are working. This procedure takes approximately one hour. There are no restrictions for this procedure. 04/01/14 AT 3:00 PM     Your Physician recommends you have a Generator change 04/02/14 AT 12:00AM. It is recommended that you replace a pacemaker generator that is at the end of its service life. The remaining lifespan of a pacemaker is determined during visits to the Pacemaker Clinic. The battery in a pacemaker does not stop suddenly but rather loses its charge slowly, which lets the cardiologist plan the replacement date. Duration This procedure takes less than one hour. The total hospital stay is approximately 24 to 48 hours.   Your physician recommends that you have lab work today:BMET,CBC,INR

## 2014-03-27 ENCOUNTER — Encounter: Payer: Self-pay | Admitting: Internal Medicine

## 2014-03-28 ENCOUNTER — Encounter (HOSPITAL_COMMUNITY): Payer: Self-pay

## 2014-04-01 ENCOUNTER — Ambulatory Visit (HOSPITAL_COMMUNITY): Payer: Medicare PPO | Attending: Cardiology | Admitting: Radiology

## 2014-04-01 DIAGNOSIS — I428 Other cardiomyopathies: Secondary | ICD-10-CM | POA: Diagnosis not present

## 2014-04-01 DIAGNOSIS — J449 Chronic obstructive pulmonary disease, unspecified: Secondary | ICD-10-CM | POA: Insufficient documentation

## 2014-04-01 DIAGNOSIS — I079 Rheumatic tricuspid valve disease, unspecified: Secondary | ICD-10-CM | POA: Insufficient documentation

## 2014-04-01 DIAGNOSIS — I509 Heart failure, unspecified: Secondary | ICD-10-CM | POA: Diagnosis not present

## 2014-04-01 DIAGNOSIS — J4489 Other specified chronic obstructive pulmonary disease: Secondary | ICD-10-CM | POA: Insufficient documentation

## 2014-04-01 DIAGNOSIS — I517 Cardiomegaly: Secondary | ICD-10-CM | POA: Insufficient documentation

## 2014-04-01 DIAGNOSIS — I359 Nonrheumatic aortic valve disorder, unspecified: Secondary | ICD-10-CM | POA: Insufficient documentation

## 2014-04-01 DIAGNOSIS — I059 Rheumatic mitral valve disease, unspecified: Secondary | ICD-10-CM | POA: Diagnosis not present

## 2014-04-01 DIAGNOSIS — E119 Type 2 diabetes mellitus without complications: Secondary | ICD-10-CM | POA: Insufficient documentation

## 2014-04-01 DIAGNOSIS — G4733 Obstructive sleep apnea (adult) (pediatric): Secondary | ICD-10-CM | POA: Insufficient documentation

## 2014-04-01 DIAGNOSIS — I5042 Chronic combined systolic (congestive) and diastolic (congestive) heart failure: Secondary | ICD-10-CM

## 2014-04-01 MED ORDER — SODIUM CHLORIDE 0.9 % IV SOLN
INTRAVENOUS | Status: DC
  Administered 2014-04-02: 11:00:00 via INTRAVENOUS

## 2014-04-01 MED ORDER — CEFAZOLIN SODIUM-DEXTROSE 2-3 GM-% IV SOLR: 2.0000 g | INTRAVENOUS | Status: DC

## 2014-04-01 MED ORDER — SODIUM CHLORIDE 0.9 % IR SOLN
80.0000 mg | Status: DC
  Filled 2014-04-01: qty 2

## 2014-04-01 NOTE — Progress Notes (Signed)
Echocardiogram performed.  

## 2014-04-02 ENCOUNTER — Encounter (HOSPITAL_COMMUNITY): Payer: Self-pay | Admitting: *Deleted

## 2014-04-02 ENCOUNTER — Ambulatory Visit (HOSPITAL_COMMUNITY)
Admission: RE | Admit: 2014-04-02 | Discharge: 2014-04-02 | Disposition: A | Discharge status: Home / Self Care | Payer: Medicare PPO | Source: Ambulatory Visit | Attending: Internal Medicine | Admitting: Internal Medicine

## 2014-04-02 ENCOUNTER — Encounter (HOSPITAL_COMMUNITY)
Admission: RE | Disposition: A | Discharge status: Home / Self Care | Payer: Self-pay | Source: Ambulatory Visit | Attending: Internal Medicine

## 2014-04-02 DIAGNOSIS — E119 Type 2 diabetes mellitus without complications: Secondary | ICD-10-CM | POA: Insufficient documentation

## 2014-04-02 DIAGNOSIS — J4489 Other specified chronic obstructive pulmonary disease: Secondary | ICD-10-CM | POA: Insufficient documentation

## 2014-04-02 DIAGNOSIS — F3289 Other specified depressive episodes: Secondary | ICD-10-CM | POA: Insufficient documentation

## 2014-04-02 DIAGNOSIS — F329 Major depressive disorder, single episode, unspecified: Secondary | ICD-10-CM | POA: Insufficient documentation

## 2014-04-02 DIAGNOSIS — I428 Other cardiomyopathies: Secondary | ICD-10-CM | POA: Insufficient documentation

## 2014-04-02 DIAGNOSIS — Z4502 Encounter for adjustment and management of automatic implantable cardiac defibrillator: Secondary | ICD-10-CM | POA: Insufficient documentation

## 2014-04-02 DIAGNOSIS — J449 Chronic obstructive pulmonary disease, unspecified: Secondary | ICD-10-CM | POA: Insufficient documentation

## 2014-04-02 DIAGNOSIS — G4733 Obstructive sleep apnea (adult) (pediatric): Secondary | ICD-10-CM | POA: Insufficient documentation

## 2014-04-02 DIAGNOSIS — I509 Heart failure, unspecified: Secondary | ICD-10-CM | POA: Insufficient documentation

## 2014-04-02 DIAGNOSIS — E669 Obesity, unspecified: Secondary | ICD-10-CM | POA: Insufficient documentation

## 2014-04-02 DIAGNOSIS — E785 Hyperlipidemia, unspecified: Secondary | ICD-10-CM | POA: Insufficient documentation

## 2014-04-02 DIAGNOSIS — E0590 Thyrotoxicosis, unspecified without thyrotoxic crisis or storm: Secondary | ICD-10-CM | POA: Insufficient documentation

## 2014-04-02 DIAGNOSIS — I10 Essential (primary) hypertension: Secondary | ICD-10-CM | POA: Insufficient documentation

## 2014-04-02 DIAGNOSIS — I5042 Chronic combined systolic (congestive) and diastolic (congestive) heart failure: Secondary | ICD-10-CM | POA: Insufficient documentation

## 2014-04-02 DIAGNOSIS — Z794 Long term (current) use of insulin: Secondary | ICD-10-CM | POA: Insufficient documentation

## 2014-04-02 HISTORY — PX: IMPLANTABLE CARDIOVERTER DEFIBRILLATOR (ICD) GENERATOR CHANGE: SHX5469

## 2014-04-02 LAB — POTASSIUM: Potassium: 3.9 mEq/L (ref 3.7–5.3)

## 2014-04-02 LAB — SURGICAL PCR SCREEN
MRSA, PCR: NEGATIVE
Staphylococcus aureus: POSITIVE — AB

## 2014-04-02 LAB — GLUCOSE, CAPILLARY
Glucose-Capillary: 182 mg/dL — ABNORMAL HIGH (ref 70–99)
Glucose-Capillary: 131 mg/dL — ABNORMAL HIGH (ref 70–99)

## 2014-04-02 SURGERY — ICD GENERATOR CHANGE
Anesthesia: LOCAL

## 2014-04-02 MED ORDER — MUPIROCIN 2 % EX OINT
TOPICAL_OINTMENT | CUTANEOUS | Status: AC
  Administered 2014-04-02: 1 via NASAL
  Filled 2014-04-02: qty 22

## 2014-04-02 MED ORDER — MIDAZOLAM HCL 5 MG/5ML IJ SOLN
INTRAMUSCULAR | Status: AC
  Filled 2014-04-02: qty 5

## 2014-04-02 MED ORDER — FENTANYL CITRATE 0.05 MG/ML IJ SOLN
INTRAMUSCULAR | Status: AC
  Filled 2014-04-02: qty 2

## 2014-04-02 MED ORDER — CEFAZOLIN SODIUM-DEXTROSE 2-3 GM-% IV SOLR
INTRAVENOUS | Status: AC
  Filled 2014-04-02: qty 50

## 2014-04-02 MED ORDER — ONDANSETRON HCL 4 MG/2ML IJ SOLN: 4.0000 mg | Freq: Four times a day (QID) | INTRAMUSCULAR | Status: DC | PRN

## 2014-04-02 MED ORDER — CHLORHEXIDINE GLUCONATE 4 % EX LIQD
60.0000 mL | Freq: Once | CUTANEOUS | Status: DC
  Filled 2014-04-02: qty 60

## 2014-04-02 MED ORDER — MUPIROCIN 2 % EX OINT
TOPICAL_OINTMENT | Freq: Two times a day (BID) | CUTANEOUS | Status: DC
  Administered 2014-04-02: 1 via NASAL
  Filled 2014-04-02: qty 22

## 2014-04-02 MED ORDER — LIDOCAINE HCL (PF) 1 % IJ SOLN
INTRAMUSCULAR | Status: AC
  Filled 2014-04-02: qty 60

## 2014-04-02 MED ORDER — ACETAMINOPHEN 325 MG PO TABS
325.0000 mg | ORAL_TABLET | ORAL | Status: DC | PRN
  Filled 2014-04-02: qty 2

## 2014-04-02 NOTE — H&P (Signed)
  ICD Criteria  Current LVEF:35% ;Obtained < 1 month ago.  NYHA Functional Classification: Class II  Heart Failure History:  Yes, Duration of heart failure since onset is > 9 months  Non-Ischemic Dilated Cardiomyopathy History:  Yes, timeframe is > 9 months  Atrial Fibrillation/Atrial Flutter:  No.  Ventricular Tachycardia History:  No.  Cardiac Arrest History:  No  History of Syndromes with Risk of Sudden Death:  No.  Previous ICD:  Yes, ICD Type:  Single, Reason for ICD:  Primary prevention.  35%  Electrophysiology Study: No.  Prior MI: No.  PPM: No.  OSA:  No  Patient Life Expectancy of >=1 year: Yes.  Anticoagulation Therapy:  Patient is NOT on anticoagulation therapy.   Beta Blocker Therapy:  Yes.   Ace Inhibitor/ARB Therapy:  Yes.

## 2014-04-02 NOTE — Discharge Instructions (Signed)
Pacemaker Battery Change, Care After Refer to this sheet in the next few weeks. These instructions provide you with information on caring for yourself after your procedure. Your health care provider may also give you more specific instructions. Your treatment has been planned according to current medical practices, but problems sometimes occur. Call your health care provider if you have any problems or questions after your procedure. WHAT TO EXPECT AFTER THE PROCEDURE After your procedure, it is typical to have the following sensations:  Soreness at the pacemaker site. HOME CARE INSTRUCTIONS   Keep the incision clean and dry.  Unless advised otherwise, you may shower beginning 48 hours after your procedure.  For the first week after the replacement, avoid stretching motions that pull at the incision site, and avoid heavy exercise with the arm that is on the same side as the incision.  Only take over-the-counter or prescription medicines for pain, discomfort, or fever as directed by your health care provider.  Your health care provider will tell you when you will need to next test your pacemaker by telephone or when to return to the office for follow-up for removal of stitches. SEEK MEDICAL CARE IF:   You have pain at the incision site that is not relieved by over-the-counter or prescription medicine.  There is drainage or pus from the incision site.  There is swelling larger than a lime at the incision site.  You develop red streaking that extends above or below the incision site.  You feel brief, intermittent palpitations, lightheadedness, or any symptoms that you feel might be related to your heart. SEEK IMMEDIATE MEDICAL CARE IF:   You experience chest pain that is different than the pain at the pacemaker site.  You experience shortness of breath.  You have palpitations or irregular heartbeat.  You have lightheadedness that does not go away quickly.  You faint.  You have  pain that gets worse and is not relieved by medicine. MAKE SURE YOU:   Understand these instructions.  Will watch your condition.  Will get help right away if you are not doing well or get worse. Document Released: 07/10/2013 Document Revised: 09/24/2013 Document Reviewed: 07/10/2013 [facility] Patient Information 2015 [place], [place]. This information is not intended to replace advice given to you by your health care provider. Make sure you discuss any questions you have with your health care provider.

## 2014-04-02 NOTE — CV Procedure (Signed)
Electrophysiology procedure note  Procedure: Removal of a previously implanted ICD which had reached elective replacement, and insertion of a new ICD.  Indication: long-standing nonischemic cardiomyopathy, chronic class II systolic heart failure, despite maximal medical therapy, status post prior ICD, now at elective replacement  Description of the procedure: After informed consent was obtained, the patient was taken to the diagnostic electrophysiology laboratory in the fasting state. After the usual preparation and draping, intravenous Versed and fentanyl were used for sedation. 30 cc of lidocaine was infiltrated into the left pectoral region, over the old ICD insertion site. A 6 cm incision was carried out. Electrocautery was utilized to dissect down to the ICD pocket. The ICD was removed with gentle traction. The lead was evaluated, and found to be working satisfactorily. R waves measured 12. The pacing impedance was 500 ohms. The pacing threshold was 0.7 V at 0.5 ms. The pocket was irrigated with antibiotic irrigation. The new St. Jude ICD, serial number [id number] was connected to the old defibrillator lead and placed back in the subcutaneous pocket. The pocket was irrigated with antibiotic irrigation. The incision was closed with 2 layers of Vicryl suture. Benzoin and Steri-Strips were painted on the skin, and the patient was returned to the recovery area in satisfactory condition.  Complications: There were no immediate complications  Results: Successful removal of a previous implanted St. Jude ICD which has reached elective replacement, and insertion of a new St. Jude ICD in a patient with a long-standing nonischemic cardiomyopathy, chronic systolic heart failure, ejection fraction 35%, despite maximal medical therapy.  Cristopher Peru, M.D.

## 2014-04-02 NOTE — H&P (Signed)
[name]  03/25/2014 10:00 AM   Office Visit  MRN:  [id number]   Description: 63 year old female  Provider: Andrez Grime, PA-C  Department: [facility] [place] Most recent update: 03/25/2014  9:57 AM by Burnett Kanaris, RMA      BP Pulse Wt            164/102 98 234 lb (106.142 kg)                   Progress Notes      Andrez Grime, [place] at 03/24/2014 12:25 PM      Status: Signed                ELECTROPHYSIOLOGY OFFICE NOTE      Patient ID: [name] MRN: [id number], DOB/AGE: 1951-03-17    Date of Visit: 03/25/2014   Primary Physician: Jerene Pitch, MD Primary Cardiologist: Stanford Breed, MD Primary EP: Lovena Le, MD Reason for Visit: EP/device follow-up   History of Present Illness  [name] is a 63 y.o. female with NICM s/p ICD implant for primary prevention SCD, chronic combined systolic and diastolic HF, DM, OSA and COPD who presents today for routine electrophysiology followup. Her ICD battery is at [facility] Since last being seen in our clinic, she reports she has no complaints. She has chronic DOE. She denies chest pain. She denies palpitations, dizziness, near syncope or syncope. She denies LE swelling, orthopnea or PND. She is compliant and tolerating medications without difficulty.   Past Medical History Past Medical History   Diagnosis  Date   .  Hyperthyroidism, subclinical     .  Post menopausal syndrome     .  Obesity     .  Cardiac defibrillator in situ         Atlas II VR (SJM) implanted by Dr Lovena Le   .  Eczema     .  Lipoma     .  Chronic ulcer of leg     .  Hyperlipidemia     .  Chronic combined systolic and diastolic heart failure         a. EF 35-40% in past;  b. Echo 7/13:  EF 45-50%, Gr 2 diast dysfn, mild AI, mild MAC, trivial MR, mild LAE, PASP 47.   [name]  NICM (nonischemic cardiomyopathy)     .  Diabetes mellitus     .  HTN (hypertension)     .  Elevated alkaline phosphatase level         GGT and  5'nucleotidase 8/13 normal   .  Hx of cardiac cath         a. [place] 2003 normal;  b. LHC 6/13:  Mild calcification in the LM, o/w normal coronary arteries, EF 45%.    .  Sleep apnea         pt denies 04/12/2013   .  COPD (chronic obstructive pulmonary disease)         O2 at night   .  Automatic implantable cardioverter-defibrillator in situ     .  CHF (congestive heart failure)     .  Depression     .  Implantable cardioverter-defibrillator (ICD) generator end of life         Past Surgical History Past Surgical History   Procedure  Laterality  Date   .  Cardiac defibrillator placement  05/04/2007       SJM Atlas II VR ICD   .  Hysteroscopy       .  Cardiac defibrillator placement       .  Abdominal hysterectomy       .  Cardiac catheterization       .  Insert / replace / remove pacemaker       .  Tubal ligation       .  Hernia repair       .  Colonoscopy  N/A  04/12/2013       Procedure: COLONOSCOPY;  Surgeon: Beryle Beams, MD;  Location: [facility];  Service: Endoscopy;  Laterality: N/A;  pt.has defibrilator       Allergies/Intolerances Allergies   Allergen  Reactions   .  Avandia [Rosiglitazone Maleate]         unknown   .  Hydrocortisone         unknown   .  Pioglitazone         REACTION: congestive heart failure ACTOS        Current Home Medications Current Outpatient Prescriptions   Medication  Sig  Dispense  Refill   .  albuterol (VENTOLIN HFA) 108 (90 BASE) MCG/ACT inhaler  Inhale 2 puffs into the lungs every 6 (six) hours as needed. Shortness of breath   1 Inhaler   6   .  amLODipine (NORVASC) 10 MG tablet  Take 1 tablet (10 mg total) by mouth daily.   30 tablet   11   .  aspirin 81 MG tablet  Take 1 tablet (81 mg total) by mouth daily.   30 tablet   11   .  atorvastatin (LIPITOR) 80 MG tablet  Take 1 tablet (80 mg total) by mouth at bedtime.   30 tablet   11   .  citalopram (CELEXA) 20 MG tablet  Take 1 tablet (20 mg total) by mouth daily.   30 tablet    11   .  Fluticasone-Salmeterol (ADVAIR) 250-50 MCG/DOSE AEPB  Inhale 1 puff into the lungs every 12 (twelve) hours.   60 each   11   .  furosemide (LASIX) 40 MG tablet  Take 1 tablet (40 mg total) by mouth 2 (two) times daily.   45 tablet   11   .  gabapentin (NEURONTIN) 300 MG capsule  Take 1 capsule (300 mg total) by mouth at bedtime.   30 capsule   11   .  glucose blood (ONE TOUCH ULTRA TEST) test strip  Use as instructed by doctor up to 3x daily,dx code 250.02 insulin requiring   100 each   5   .  insulin glargine (LANTUS) 100 UNIT/ML injection  Inject 44 Units into the skin daily with breakfast.         .  insulin lispro (HUMALOG) 100 UNIT/ML injection  Inject 8 units with breakfast and 12 units with lunch and 10 units with dinner   10 mL   5   .  ketoconazole (NIZORAL) 2 % shampoo  Apply 1 application topically 3 (three) times a week.   120 mL   3   .  lisinopril (PRINIVIL,ZESTRIL) 40 MG tablet  Take 1 tablet (40 mg total) by mouth daily.   30 tablet   11   .  metoprolol (TOPROL-XL) 200 MG 24 hr tablet  Take 1 tablet (200 mg total) by mouth daily.   30 tablet   11   .  spironolactone (ALDACTONE) 25 MG tablet  Take 1 tablet (25 mg total) by mouth daily.   30 tablet   11   .  triamcinolone (KENALOG) 0.1 % ointment  Apply 1 application topically as needed. rash         .  nitroGLYCERIN (NITROSTAT) 0.4 MG SL tablet  Place 1 tablet (0.4 mg total) under the tongue every 5 (five) minutes as needed for chest pain.   25 tablet   3       No current facility-administered medications for this visit.        Social History History       Social History   .  Marital Status:  Widowed       Spouse Name:  Alroy Dust       Number of Children:  N/A   .  Years of Education:  N/A       Occupational History   .  disabled         Social History Main Topics   .  Smoking status:  Light Tobacco Smoker -- 0.25 packs/day for 35 years       Types:  Cigarettes   .  Smokeless tobacco:  Not on file          Comment: currently smoking 5 cigs daily or less.   .  Alcohol Use:  No   .  Drug Use:  No   .  Sexual Activity:  Not on file       Other Topics  Concern   .  Not on file       Social History Narrative     ** Merged History Encounter **           Married        Review of Systems General: No chills, fever, night sweats or weight changes Cardiovascular: No chest pain, edema, orthopnea, palpitations, paroxysmal nocturnal dyspnea Dermatological: No rash, lesions or masses Respiratory: No cough Urologic: No hematuria, dysuria Abdominal: No nausea, vomiting, diarrhea, bright red blood per rectum, melena, or hematemesis Neurologic: No visual changes, weakness, changes in mental status All other systems reviewed and are otherwise negative except as noted above.   Physical Exam Vitals: Blood pressure 164/102, pulse 98, weight 234 lb (106.142 kg).  General: Well developed, well appearing 63 y.o. female in no acute distress. HEENT: Normocephalic, atraumatic. EOMs intact. Sclera nonicteric. Oropharynx clear.    Neck: Supple. No JVD. Lungs: Respirations regular and unlabored, CTA bilaterally. No wheezes, rales or rhonchi. Heart: RRR. S1, S2 present. No murmurs, rub, S3 or S4. Abdomen: Soft, non-distended.   Extremities: No clubbing, cyanosis or edema. DP/PT/Radials 2+ and equal bilaterally. Psych: Normal affect. Neuro: Alert and oriented X 3. Moves all extremities spontaneously. Skin: Left upper chest / implant site intact and well healed.   Diagnostics   Echocardiogram (most recent July 2013) Study Conclusions - Left ventricle: The cavity size was normal. Wall thickness was normal. Systolic function was mildly reduced. The estimated ejection fraction was in the range of 45% to 50%. Diffuse hypokinesis. Features are consistent with a pseudonormal left ventricular filling pattern, with concomitant abnormal relaxation and increased filling pressure (grade 2 diastolic  dysfunction). - Aortic valve: There was no stenosis. Mild regurgitation. - Mitral valve: Mildly calcified annulus. Trivial regurgitation. - Left atrium: The atrium was mildly dilated. - Right ventricle: The cavity size was normal. Pacer wire or catheter noted in right ventricle. Systolic function was normal. - Pulmonary arteries: PA peak pressure: 56mm  Hg (S). - Inferior vena cava: The vessel was normal in size; the respirophasic diameter changes were in the normal range (= 50%); findings are consistent with normal central venous pressure. Impression: - Normal LV size with mild global hypokinesis, EF 45-50%. Moderate diastolic dysfunction. Normal RV size and systolic function. Mild pulmonary hypertension.   12-lead ECG today - NSR at 98 bpm; nonspecific T wave abnormalities; QRS duration 80 msec Device interrogation yesterday - Battery at ERI since 02/10/2014. Otherwise normal device function. Threshold, sensing and impedance stable. No VT/VF episodes.    Assessment and Plan ICD battery at [facility] NICM s/p ICD implant for primary prevention of SCD Chronic combined systolic and diastolic HF   Ms. Shults's ICD battery has reached ERI. Otherwise device function is normal and no programming changes were made. No VT/VF episodes. We discussed need for ICD generator change and reviewed the procedure, including risks and benefits. Risks, benefits and alternatives to ICD generator change were reviewed in detail. These risks include, but are not limited to, lead dislodgement, bleeding and infection. Ms. Bellizzi expressed verbal understanding and agrees to proceed. This will be scheduled with Dr. Lovena Le at the next available time. As required for NCDR ICD registry, will update echo. Of note, she is on guideline-directed medical therapy with metoprolol succinate and ACEI. In addition, her V rate histograms are appropriate for activity level and I do not think she needs an atrial lead at this time. Also, she  has a narrow QRS and does not meet criteria for CRT at this time.    Signed, Ileene Hutchinson, PA-C 03/25/2014, 1:38 PM  EP Attending  Patient seen and examined. Agree with above. Will plan to proceed with ICD generator removal and insertion of a new device.  Gregg Taylor,M.D.                    Encounter-Level Documents:            Electronic signature on 03/25/2014 9:47 AM            Referring Provider      Jerene Pitch, MD           Diagnoses      ICD (implantable cardioverter-defibrillator) battery depletion    -  Primary      V53.32      Chronic combined systolic and diastolic heart failure          428.42      NICM (nonischemic cardiomyopathy)          425.4      ICD (implantable cardioverter-defibrillator), single, in situ          V45.02                     Orders Placed This Encounter      Basic metabolic panel [id number] Custom]      CBC w/Diff [id number] Custom]      EKG 12-Lead [EKG1 Custom]      INR/PT [LAB320 Custom]            Future Labs/Procedures Expected by Expires        2D Echocardiogram without contrast [ECH1000 Custom] As directed 03/26/2015        Basic metabolic panel [id number] Custom] As directed 03/26/2015              Results are available for this encounter                Level of Service  PR OFFICE OUTPATIENT VISIT 25 MINUTES [phone]              All Charges for This Encounter      Code Description Service Date Service Provider Modifiers Qty      93000 PR ELECTROCARDIOGRAM, COMPLETE 03/25/2014 Azzie Roup [place], PA-C   1      [phone] PR COLLECTION VENOUS BLOOD,VENIPUNCTURE 03/25/2014 Andrez Grime, PA-C   1      [phone] PR OFFICE OUTPATIENT VISIT 25 MINUTES 03/25/2014 Azzie Roup [place], PA-C   1      [phone] CHG COMPLETE CBC & AUTO DIFF WBC 03/25/2014 Azzie Roup [place], PA-C   1      [phone] CHG PROTHROMBIN TIME 03/25/2014 Azzie Roup [place], PA-C   1      [phone] CHG BASIC METABOLIC PANEL CALCIUM TOTAL 03/25/2014 Andrez Grime, PA-C   1             Patient Instructions      Your physician has requested that you have an echocardiogram. Echocardiography is a painless test that uses sound waves to create images of your heart. It provides your doctor with information about the size and shape of your heart and how well your heart's chambers and valves are working. This procedure takes approximately one hour. There are no restrictions for this procedure. 04/01/14 AT 3:00 PM         Your Physician recommends you have a Generator change 04/02/14 AT 12:00AM. It is recommended that you replace a pacemaker generator that is at the end of its service life. The remaining lifespan of a pacemaker is determined during visits to the Pacemaker Clinic. The battery in a pacemaker does not stop suddenly but rather loses its charge slowly, which lets the cardiologist plan the replacement date. Duration This procedure takes less than one hour. The total hospital stay is approximately 24 to 48 hours.     Your physician recommends that you have lab work today:BMET,CBC,INR                  Patient Instructions History Recorded               Previous Visit        Provider Department Encounter #      03/24/2014 10:00 AM Jacqualine Mau [facility] [id number]

## 2014-04-07 ENCOUNTER — Telehealth: Payer: Self-pay | Admitting: Internal Medicine

## 2014-04-07 NOTE — Telephone Encounter (Signed)
New problem   Pt had defib replaced on 04/02/14 and has an appt on 04/16/14, pt want to know if she need to keep her strips on. Please advise pt.

## 2014-04-07 NOTE — Telephone Encounter (Signed)
MESSAGE  FORWARDED TO  DEVICE CLINIC./CY

## 2014-04-08 ENCOUNTER — Ambulatory Visit (INDEPENDENT_AMBULATORY_CARE_PROVIDER_SITE_OTHER): Payer: Commercial Managed Care - HMO | Admitting: Internal Medicine

## 2014-04-08 ENCOUNTER — Encounter: Payer: Self-pay | Admitting: Internal Medicine

## 2014-04-08 VITALS — BP 139/77 | HR 95 | Temp 98.2°F | Ht 61.0 in | Wt 232.5 lb

## 2014-04-08 DIAGNOSIS — L989 Disorder of the skin and subcutaneous tissue, unspecified: Secondary | ICD-10-CM

## 2014-04-08 DIAGNOSIS — E119 Type 2 diabetes mellitus without complications: Secondary | ICD-10-CM

## 2014-04-08 DIAGNOSIS — R2241 Localized swelling, mass and lump, right lower limb: Secondary | ICD-10-CM

## 2014-04-08 DIAGNOSIS — Z0000 Encounter for general adult medical examination without abnormal findings: Secondary | ICD-10-CM

## 2014-04-08 DIAGNOSIS — I10 Essential (primary) hypertension: Secondary | ICD-10-CM

## 2014-04-08 LAB — GLUCOSE, CAPILLARY: Glucose-Capillary: 138 mg/dL — ABNORMAL HIGH (ref 70–99)

## 2014-04-08 LAB — POCT GLYCOSYLATED HEMOGLOBIN (HGB A1C): Hemoglobin A1C: 10.9

## 2014-04-08 MED ORDER — AMLODIPINE BESYLATE 10 MG PO TABS: 10.0000 mg | ORAL_TABLET | Freq: Every day | ORAL | Status: DC

## 2014-04-08 MED ORDER — LISINOPRIL 40 MG PO TABS: 40.0000 mg | ORAL_TABLET | Freq: Every day | ORAL | Status: DC

## 2014-04-08 NOTE — Progress Notes (Signed)
Subjective:   Patient ID: [name] female   DOB: 28-Jul-1951 63 y.o.   MRN: [id number]  HPI: [name] is a 63 y.o. female with extensive PMH as listed below presenting to opc today for diabetes follow up visit.   DM2: last A1C 10 10/2013. Check A1C, foot exam, and urine microalbumin today. Still skipping some meals but does not take short acting insulin when she skips meals. Adherent to lantus. Admits to eating a lot of sweets lately including fruit, watermelon and ice cream. Does endorse some high blood sugarsd ~300s but says lowest has been 70 and denies any hypoglycemic events.   R thigh "mole"--increasing in size, slightly painful. Hx of prior one that was removed at time of hysterectomy in the past.   Past Medical History  Diagnosis Date  . Hyperthyroidism, subclinical   . Post menopausal syndrome   . Obesity   . Cardiac defibrillator in situ     Atlas II VR (SJM) implanted by Dr Lovena Le  . Eczema   . Lipoma   . Chronic ulcer of leg   . Hyperlipidemia   . Chronic combined systolic and diastolic heart failure     a. EF 35-40% in past;  b. Echo 7/13:  EF 45-50%, Gr 2 diast dysfn, mild AI, mild MAC, trivial MR, mild LAE, PASP 47.  [name] NICM (nonischemic cardiomyopathy)   . Diabetes mellitus   . HTN (hypertension)   . Elevated alkaline phosphatase level     GGT and 5'nucleotidase 8/13 normal  . Hx of cardiac cath     a. [place] 2003 normal;  b. LHC 6/13:  Mild calcification in the LM, o/w normal coronary arteries, EF 45%.   . Sleep apnea     pt denies 04/12/2013  . COPD (chronic obstructive pulmonary disease)     O2 at night  . Automatic implantable cardioverter-defibrillator in situ   . CHF (congestive heart failure)   . Depression   . Implantable cardioverter-defibrillator (ICD) generator end of life    Current Outpatient Prescriptions  Medication Sig Dispense Refill  . albuterol (VENTOLIN HFA) 108 (90 BASE) MCG/ACT inhaler Inhale 2 puffs into the lungs every 6 (six)  hours as needed. Shortness of breath  1 Inhaler  6  . amLODipine (NORVASC) 10 MG tablet Take 1 tablet (10 mg total) by mouth daily.  30 tablet  11  . aspirin EC 81 MG tablet Take 81 mg by mouth daily.      [name] atorvastatin (LIPITOR) 80 MG tablet Take 1 tablet (80 mg total) by mouth at bedtime.  30 tablet  11  . citalopram (CELEXA) 20 MG tablet Take 1 tablet (20 mg total) by mouth daily.  30 tablet  11  . Fluticasone-Salmeterol (ADVAIR) 250-50 MCG/DOSE AEPB Inhale 1 puff into the lungs every 12 (twelve) hours.  60 each  11  . furosemide (LASIX) 40 MG tablet Take 1 tablet (40 mg total) by mouth 2 (two) times daily.  45 tablet  11  . gabapentin (NEURONTIN) 300 MG capsule Take 1 capsule (300 mg total) by mouth at bedtime.  30 capsule  11  . glucose blood (ONE TOUCH ULTRA TEST) test strip Use as instructed by doctor up to 3x daily,dx code 250.02 insulin requiring  100 each  5  . insulin glargine (LANTUS) 100 UNIT/ML injection Inject 44 Units into the skin daily with breakfast.      . insulin lispro (HUMALOG) 100 UNIT/ML injection Inject 8 units with breakfast  and 12 units with lunch and 10 units with dinner  10 mL  5  . ketoconazole (NIZORAL) 2 % shampoo Apply 1 application topically 3 (three) times a week.  120 mL  3  . lisinopril (PRINIVIL,ZESTRIL) 40 MG tablet Take 1 tablet (40 mg total) by mouth daily.  30 tablet  11  . metoprolol (TOPROL-XL) 200 MG 24 hr tablet Take 1 tablet (200 mg total) by mouth daily.  30 tablet  11  . nitroGLYCERIN (NITROSTAT) 0.4 MG SL tablet Place 0.4 mg under the tongue every 5 (five) minutes as needed for chest pain.      [name] spironolactone (ALDACTONE) 25 MG tablet Take 1 tablet (25 mg total) by mouth daily.  30 tablet  11  . triamcinolone (KENALOG) 0.1 % ointment Apply 1 application topically as needed. rash       No current facility-administered medications for this visit.   Family History  Problem Relation Age of Onset  . Stroke Mother   . Seizures Father   . Diabetes  Sister   . Asthma Maternal Aunt     aunts  . Asthma Maternal Uncle     uncles  . Heart disease Father   . Heart disease Paternal Aunt     aunts  . Heart disease Paternal Uncle     uncles  . Heart disease Maternal Aunt     aunts  . Heart disease Maternal Uncle     uncles  . Heart disease Maternal Grandfather    History   Social History  . Marital Status: Widowed    Spouse Name: [name]    Number of Children: N/A  . Years of Education: N/A   Occupational History  . disabled    Social History Main Topics  . Smoking status: Light Tobacco Smoker -- 0.25 packs/day for 35 years    Types: Cigarettes  . Smokeless tobacco: Not on file     Comment: currently smoking 5 cigs daily or less.  . Alcohol Use: 0.6 oz/week    1 Glasses of wine per week  . Drug Use: No  . Sexual Activity: Not on file   Other Topics Concern  . Not on file   Social History Narrative   ** Merged History Encounter **       Married   Review of Systems:  Constitutional:  Denies fever  HEENT:  Denies congestion  Respiratory:  Denies SOB  Cardiovascular:  Denies chest pain   Gastrointestinal:  Denies nausea, vomiting, abdominal pain  Genitourinary:  Denies dysuria  Musculoskeletal:  Denies myalgias   Skin:  R thigh mass   Neurological:  Denies syncope    Objective:  Physical Exam: Filed Vitals:   04/08/14 1542  BP: 139/77  Pulse: 95  Temp: 98.2 F (36.8 C)  TempSrc: Oral  Height: 5\' 1"  (1.549 m)  Weight: 232 lb 8 oz (105.461 kg)  SpO2: 98%   Vitals reviewed. General: sitting in chair, NAD, cigarette smoke HEENT: EOMI Cardiac: RRR, R dressing in place for pacemaker Pulm: clear to auscultation bilaterally Abd: soft, obese, nontender, nondistended, BS present Ext: warm and well perfused, trace lower extremity edema, +2DP B/L, large right pedunculated, soft lesion on inner/medial surface of right thigh upper portion, slight tenderness to palpation.  Neuro: alert and oriented  X3       Assessment & Plan:  Discussed with Dr. Dareen Piano Needs derm referral to remove lesion on right thigh inner side

## 2014-04-08 NOTE — Assessment & Plan Note (Signed)
Increasing in size, has been present for a while per patient, not bothersome before but now is since it is bigger. Slighty tender, no drainage, no surrounding edema. Soft. Hx of prior similar lesion that was removed during hysterectomy.   -derm referral for removal and biopsy once insurance card corrected -educated patient to return and let me know if increases in size, painful, swelling, edema, or has any drainage.

## 2014-04-08 NOTE — Assessment & Plan Note (Signed)
BP Readings from Last 3 Encounters:  04/08/14 139/77  04/02/14 138/82  04/02/14 138/82   Lab Results  Component Value Date   NA 137 03/25/2014   K 3.9 04/02/2014   CREATININE 0.9 03/25/2014   Assessment: Blood pressure control: controlled Progress toward BP goal:  at goal  Plan: Medications: continue current medications: norvasc 10mg , lasix 40mg  bid, and lisinopril 40mg  and aldactone 25mg  Educational resources provided:   Self management tools provided:   Other plans: refilled norvasc and lisinopril today

## 2014-04-08 NOTE — Patient Instructions (Signed)
General Instructions:  Please get your insurance card fixed so that i can send you to dermatology for your right thigh lesion. If it keeps getting bigger, or bleeding, or pus oozing, swelling, or pain, call us right away or go to ER  PLEASE WORK ON YOUR DIABETES. CUT BACK ON THE SUGAR FOODS.   Return in 3 months and we will check up on the phone in 1 month to see how you are doing  Please bring your medicines with you each time you come to clinic.  Medicines may include prescription medications, over-the-counter medications, herbal remedies, eye drops, vitamins, or other pills.   Progress Toward Treatment Goals:  Treatment Goal 04/08/2014  Hemoglobin A1C deteriorated  Blood pressure at goal  Stop smoking smoking the same amount  Prevent falls -    Self Care Goals & Plans:  Self Care Goal 04/08/2014  Manage my medications take my medicines as prescribed; refill my medications on time; bring my medications to every visit  Monitor my health keep track of my blood glucose; bring my glucose meter and log to each visit  Eat healthy foods eat foods that are low in salt; eat more vegetables; eat baked foods instead of fried foods  Be physically active find an activity I enjoy; take a walk every day  Stop smoking -    Home Blood Glucose Monitoring 04/08/2014  Check my blood sugar 3 times a day  When to check my blood sugar before meals     Care Management & Community Referrals:  Referral 01/30/2013  Referrals made for care management support diabetes educator; nutritionist  Referrals made to community resources smoking cessation; nutrition

## 2014-04-08 NOTE — Assessment & Plan Note (Signed)
Foot exam, microalbumin, and a1c done today.

## 2014-04-08 NOTE — Assessment & Plan Note (Signed)
Lab Results  Component Value Date   HGBA1C 10.9 04/08/2014   HGBA1C 10.0 10/08/2013   HGBA1C 10.3 07/09/2013    Assessment: Diabetes control: poor control (HgbA1C >9%) Progress toward A1C goal:  deteriorated Comments: A1C worse by 0.9 despite her reporting feeling well. No more hypoglycemic events. Complaint with insulin but has not been following a strict diet. Did bring meter today but not working, perhaps batter is out, she will check with pharmacy, unable to download meter today. She does report cbg 70 today before eating and 130s in opc today. Denies cbgs lower than 70 lately. Still skipping some meals but does not take humalog if meals skipped.   Plan: Medications: continue Lantus 44 units and humalog 8,10,and 12 with breakfast, lunch, and dinner respectively and no humalog if skipping meals. She wishes to not make changes to regimen at this time and will cut back on her sweet and improve diet Home glucose monitoring: Frequency: 3 times a day Timing: before meals Other plans: recommend calling her in 1 month to see if cbgs improving. rtc 3 months. She does not wish to meet with CDE again.

## 2014-04-09 LAB — MICROALBUMIN / CREATININE URINE RATIO
Creatinine, Urine: 127.7 mg/dL
Microalb Creat Ratio: 184.6 mg/g — ABNORMAL HIGH (ref 0.0–30.0)
Microalb, Ur: 23.57 mg/dL — ABNORMAL HIGH (ref 0.00–1.89)

## 2014-04-09 NOTE — Telephone Encounter (Signed)
Spoke w/pt and advised to leave strips on until appt.

## 2014-04-09 NOTE — Progress Notes (Signed)
INTERNAL MEDICINE TEACHING ATTENDING ADDENDUM - Racer Quam, MD: I reviewed and discussed at the time of visit with the resident Dr. Qureshi, the patient's medical history, physical examination, diagnosis and results of pertinent tests and treatment and I agree with the patient's care as documented.  

## 2014-04-16 ENCOUNTER — Ambulatory Visit (INDEPENDENT_AMBULATORY_CARE_PROVIDER_SITE_OTHER): Payer: Commercial Managed Care - HMO | Admitting: *Deleted

## 2014-04-16 DIAGNOSIS — I428 Other cardiomyopathies: Secondary | ICD-10-CM

## 2014-04-16 LAB — MDC_IDC_ENUM_SESS_TYPE_INCLINIC
Battery Remaining Longevity: 99.6 mo
Brady Statistic RV Percent Paced: 0 %
Date Time Interrogation Session: 20150715093251
HighPow Impedance: 46.3618
Implantable Pulse Generator Serial Number: 7206538
Lead Channel Impedance Value: 487.5 Ohm
Lead Channel Pacing Threshold Amplitude: 0.75 V
Lead Channel Pacing Threshold Pulse Width: 0.5 ms
Lead Channel Sensing Intrinsic Amplitude: 11.9 mV
Lead Channel Setting Pacing Amplitude: 2.5 V
Lead Channel Setting Pacing Pulse Width: 0.5 ms
Lead Channel Setting Sensing Sensitivity: 0.5 mV
Zone Setting Detection Interval: 250 ms
Zone Setting Detection Interval: 300 ms

## 2014-04-16 NOTE — Progress Notes (Signed)
Wound check appointment. Steri-strips removed. Wound without redness or edema. Incision edges approximated, wound well healed. Normal device function. Thresholds, sensing, and impedances consistent with implant measurements. Histogram distribution appropriate for patient and level of activity. No ventricular arrhythmias noted. Patient educated about wound care, arm mobility, lifting restrictions, shock plan. ROV 07-23-14 @ 930 with GT.

## 2014-05-12 ENCOUNTER — Emergency Department (HOSPITAL_COMMUNITY)
Admission: EM | Admit: 2014-05-12 | Discharge: 2014-05-12 | Disposition: A | Discharge status: Home / Self Care | Payer: Medicare PPO | Attending: Emergency Medicine | Admitting: Emergency Medicine

## 2014-05-12 ENCOUNTER — Encounter (HOSPITAL_COMMUNITY): Payer: Self-pay | Admitting: Emergency Medicine

## 2014-05-12 DIAGNOSIS — Z9981 Dependence on supplemental oxygen: Secondary | ICD-10-CM | POA: Diagnosis not present

## 2014-05-12 DIAGNOSIS — Z79899 Other long term (current) drug therapy: Secondary | ICD-10-CM | POA: Diagnosis not present

## 2014-05-12 DIAGNOSIS — J449 Chronic obstructive pulmonary disease, unspecified: Secondary | ICD-10-CM | POA: Diagnosis not present

## 2014-05-12 DIAGNOSIS — Z95810 Presence of automatic (implantable) cardiac defibrillator: Secondary | ICD-10-CM | POA: Insufficient documentation

## 2014-05-12 DIAGNOSIS — I10 Essential (primary) hypertension: Secondary | ICD-10-CM | POA: Diagnosis not present

## 2014-05-12 DIAGNOSIS — Z7982 Long term (current) use of aspirin: Secondary | ICD-10-CM | POA: Insufficient documentation

## 2014-05-12 DIAGNOSIS — R1012 Left upper quadrant pain: Secondary | ICD-10-CM | POA: Diagnosis not present

## 2014-05-12 DIAGNOSIS — Z794 Long term (current) use of insulin: Secondary | ICD-10-CM | POA: Insufficient documentation

## 2014-05-12 DIAGNOSIS — Z90710 Acquired absence of both cervix and uterus: Secondary | ICD-10-CM | POA: Insufficient documentation

## 2014-05-12 DIAGNOSIS — R1110 Vomiting, unspecified: Secondary | ICD-10-CM | POA: Diagnosis present

## 2014-05-12 DIAGNOSIS — I5042 Chronic combined systolic (congestive) and diastolic (congestive) heart failure: Secondary | ICD-10-CM | POA: Insufficient documentation

## 2014-05-12 DIAGNOSIS — F329 Major depressive disorder, single episode, unspecified: Secondary | ICD-10-CM | POA: Insufficient documentation

## 2014-05-12 DIAGNOSIS — F17200 Nicotine dependence, unspecified, uncomplicated: Secondary | ICD-10-CM | POA: Insufficient documentation

## 2014-05-12 DIAGNOSIS — R739 Hyperglycemia, unspecified: Secondary | ICD-10-CM

## 2014-05-12 DIAGNOSIS — E669 Obesity, unspecified: Secondary | ICD-10-CM | POA: Insufficient documentation

## 2014-05-12 DIAGNOSIS — E785 Hyperlipidemia, unspecified: Secondary | ICD-10-CM | POA: Insufficient documentation

## 2014-05-12 DIAGNOSIS — R1013 Epigastric pain: Secondary | ICD-10-CM | POA: Insufficient documentation

## 2014-05-12 DIAGNOSIS — Z872 Personal history of diseases of the skin and subcutaneous tissue: Secondary | ICD-10-CM | POA: Diagnosis not present

## 2014-05-12 DIAGNOSIS — F3289 Other specified depressive episodes: Secondary | ICD-10-CM | POA: Insufficient documentation

## 2014-05-12 DIAGNOSIS — R112 Nausea with vomiting, unspecified: Secondary | ICD-10-CM | POA: Diagnosis not present

## 2014-05-12 DIAGNOSIS — J4489 Other specified chronic obstructive pulmonary disease: Secondary | ICD-10-CM | POA: Insufficient documentation

## 2014-05-12 DIAGNOSIS — E119 Type 2 diabetes mellitus without complications: Secondary | ICD-10-CM | POA: Insufficient documentation

## 2014-05-12 DIAGNOSIS — Z9889 Other specified postprocedural states: Secondary | ICD-10-CM | POA: Insufficient documentation

## 2014-05-12 LAB — COMPREHENSIVE METABOLIC PANEL
ALT: 23 U/L (ref 0–35)
AST: 23 U/L (ref 0–37)
Albumin: 4.2 g/dL (ref 3.5–5.2)
Alkaline Phosphatase: 124 U/L — ABNORMAL HIGH (ref 39–117)
Anion gap: 19 — ABNORMAL HIGH (ref 5–15)
BUN: 8 mg/dL (ref 6–23)
CO2: 24 mEq/L (ref 19–32)
Calcium: 9.1 mg/dL (ref 8.4–10.5)
Chloride: 95 mEq/L — ABNORMAL LOW (ref 96–112)
Creatinine, Ser: 0.73 mg/dL (ref 0.50–1.10)
GFR calc Af Amer: >90 mL/min (ref >90)
GFR calc non Af Amer: >90 mL/min (ref >90)
Glucose, Bld: 393 mg/dL — ABNORMAL HIGH (ref 70–99)
Potassium: 4.1 mEq/L (ref 3.7–5.3)
Sodium: 138 mEq/L (ref 137–147)
Total Bilirubin: 0.8 mg/dL (ref 0.3–1.2)
Total Protein: 7.8 g/dL (ref 6.0–8.3)

## 2014-05-12 LAB — URINE MICROSCOPIC-ADD ON

## 2014-05-12 LAB — CBC WITH DIFFERENTIAL/PLATELET
Basophils Absolute: 0 10*3/uL (ref 0.0–0.1)
Basophils Relative: 1 % (ref 0–1)
Eosinophils Absolute: 0.1 10*3/uL (ref 0.0–0.7)
Eosinophils Relative: 2 % (ref 0–5)
HCT: 43.5 % (ref 36.0–46.0)
Hemoglobin: 14.2 g/dL (ref 12.0–15.0)
Lymphocytes Relative: 15 % (ref 12–46)
Lymphs Abs: 1.3 10*3/uL (ref 0.7–4.0)
MCH: 28 pg (ref 26.0–34.0)
MCHC: 32.6 g/dL (ref 30.0–36.0)
MCV: 85.6 fL (ref 78.0–100.0)
Monocytes Absolute: 0.2 10*3/uL (ref 0.1–1.0)
Monocytes Relative: 3 % (ref 3–12)
Neutro Abs: 6.8 10*3/uL (ref 1.7–7.7)
Neutrophils Relative %: 79 % — ABNORMAL HIGH (ref 43–77)
Platelets: 237 10*3/uL (ref 150–400)
RBC: 5.08 MIL/uL (ref 3.87–5.11)
RDW: 14.7 % (ref 11.5–15.5)
WBC: 8.4 10*3/uL (ref 4.0–10.5)

## 2014-05-12 LAB — I-STAT VENOUS BLOOD GAS, ED
Acid-Base Excess: 2 mmol/L (ref 0.0–2.0)
Bicarbonate: 28.8 mEq/L — ABNORMAL HIGH (ref 20.0–24.0)
O2 Saturation: 92 %
TCO2: 30 mmol/L (ref 0–100)
pCO2, Ven: 51.2 mmHg — ABNORMAL HIGH (ref 45.0–50.0)
pH, Ven: 7.359 — ABNORMAL HIGH (ref 7.250–7.300)
pO2, Ven: 66 mmHg — ABNORMAL HIGH (ref 30.0–45.0)

## 2014-05-12 LAB — URINALYSIS, ROUTINE W REFLEX MICROSCOPIC
Bilirubin Urine: NEGATIVE
Glucose, UA: >1000 mg/dL — AB
Hgb urine dipstick: NEGATIVE
Ketones, ur: 15 mg/dL — AB
Leukocytes, UA: NEGATIVE
Nitrite: NEGATIVE
Protein, ur: NEGATIVE mg/dL
Specific Gravity, Urine: 1.015 (ref 1.005–1.030)
Urobilinogen, UA: 0.2 mg/dL (ref 0.0–1.0)
pH: 5 (ref 5.0–8.0)

## 2014-05-12 LAB — CBG MONITORING, ED
Glucose-Capillary: 386 mg/dL — ABNORMAL HIGH (ref 70–99)
Glucose-Capillary: 329 mg/dL — ABNORMAL HIGH (ref 70–99)

## 2014-05-12 LAB — LIPASE, BLOOD: Lipase: 61 U/L — ABNORMAL HIGH (ref 11–59)

## 2014-05-12 MED ORDER — INSULIN ASPART 100 UNIT/ML ~~LOC~~ SOLN
5.0000 [IU] | Freq: Once | SUBCUTANEOUS | Status: AC
  Administered 2014-05-12: 5 [IU] via INTRAVENOUS
  Filled 2014-05-12: qty 1

## 2014-05-12 MED ORDER — ONDANSETRON 4 MG PO TBDP: 4.0000 mg | ORAL_TABLET | Freq: Three times a day (TID) | ORAL | Status: DC | PRN

## 2014-05-12 MED ORDER — SODIUM CHLORIDE 0.9 % IV BOLUS (SEPSIS)
1000.0000 mL | Freq: Once | INTRAVENOUS | Status: AC
  Administered 2014-05-12: 1000 mL via INTRAVENOUS

## 2014-05-12 MED ORDER — ONDANSETRON 4 MG PO TBDP
4.0000 mg | ORAL_TABLET | Freq: Once | ORAL | Status: AC
  Administered 2014-05-12: 4 mg via ORAL
  Filled 2014-05-12: qty 1

## 2014-05-12 MED ORDER — MORPHINE SULFATE 4 MG/ML IJ SOLN
4.0000 mg | Freq: Once | INTRAMUSCULAR | Status: AC
  Administered 2014-05-12: 4 mg via INTRAVENOUS
  Filled 2014-05-12: qty 1

## 2014-05-12 NOTE — ED Notes (Signed)
Pt reports 44 units of lantus and 8 units of humalog.

## 2014-05-12 NOTE — ED Provider Notes (Signed)
CSN: [id number]     Arrival date & time 05/12/14  1437 History   First MD Initiated Contact with Patient 05/12/14 1641     Chief Complaint  Patient presents with  . Emesis   [name] is a 63 yo AAF w/PMH of non-ischemic CM s/p defibrillator (recently exchanged this July), IDDM, HLD, HTN, and CHF who presents w/vomiting. Pt had a mechanical fall at church yesterday when she tripped, falling onto her right side. She had no LOC and was ambulatory directly afterward. Did not seek care because nothing hurt. However, later that afternoon, pt began to have nausea and vomiting and this has continued into today. Does not take any blood thinners.  She denies CP, SOB, fever, diarrhea, constipation, hematemesis, dysuria, hematuria, sick contacts, or recent travel. Pt admits that her Glc has been a little high this week because she isn't taking her fast acting Glc all the time as her Glucometer broke and she was unable to check it all the time. Normal sugars between 100-200, today >300.   (Consider location/radiation/quality/duration/timing/severity/associated sxs/prior Treatment) Patient is a 63 y.o. female presenting with vomiting. The history is provided by the patient.  Emesis Severity:  Moderate Duration:  2 days Timing:  Intermittent Quality:  Stomach contents Progression:  Unchanged Chronicity:  New Recent urination:  Decreased Relieved by:  Nothing Associated symptoms: no abdominal pain, no chills, no diarrhea and no headaches   Associated symptoms comment:  Fatigue Risk factors: diabetes   Risk factors: no alcohol use     Past Medical History  Diagnosis Date  . Hyperthyroidism, subclinical   . Post menopausal syndrome   . Obesity   . Cardiac defibrillator in situ     Atlas II VR (SJM) implanted by Dr Lovena Le  . Eczema   . Lipoma   . Chronic ulcer of leg   . Hyperlipidemia   . Chronic combined systolic and diastolic heart failure     a. EF 35-40% in past;  b. Echo 7/13:  EF  45-50%, Gr 2 diast dysfn, mild AI, mild MAC, trivial MR, mild LAE, PASP 47.  [name] NICM (nonischemic cardiomyopathy)   . Diabetes mellitus   . HTN (hypertension)   . Elevated alkaline phosphatase level     GGT and 5'nucleotidase 8/13 normal  . Hx of cardiac cath     a. [place] 2003 normal;  b. [facility] 6/13:  Mild calcification in the LM, o/w normal coronary arteries, EF 45%.   . Sleep apnea     pt denies 04/12/2013  . COPD (chronic obstructive pulmonary disease)     O2 at night  . Automatic implantable cardioverter-defibrillator in situ   . CHF (congestive heart failure)   . Depression   . Implantable cardioverter-defibrillator (ICD) generator end of life    Past Surgical History  Procedure Laterality Date  . Cardiac defibrillator placement  05/04/2007    SJM Atlas II VR ICD  . Hysteroscopy    . Cardiac defibrillator placement    . Abdominal hysterectomy    . Cardiac catheterization    . Insert / replace / remove pacemaker    . Tubal ligation    . Hernia repair    . Colonoscopy N/A 04/12/2013    Procedure: COLONOSCOPY;  Surgeon: Beryle Beams, MD;  Location: [facility];  Service: Endoscopy;  Laterality: N/A;  pt.has defibrilator   Family History  Problem Relation Age of Onset  . Stroke Mother   . Seizures Father   . Diabetes  Sister   . Asthma Maternal Aunt     aunts  . Asthma Maternal Uncle     uncles  . Heart disease Father   . Heart disease Paternal Aunt     aunts  . Heart disease Paternal Uncle     uncles  . Heart disease Maternal Aunt     aunts  . Heart disease Maternal Uncle     uncles  . Heart disease Maternal Grandfather    History  Substance Use Topics  . Smoking status: Light Tobacco Smoker -- 0.25 packs/day for 35 years    Types: Cigarettes  . Smokeless tobacco: Not on file     Comment: currently smoking 3-4 cigs daily or less.  . Alcohol Use: 0.6 oz/week    1 Glasses of wine per week   OB History   Grav Para Term Preterm Abortions TAB SAB Ect Mult Living                  Review of Systems  Constitutional: Negative for fever and chills.  Respiratory: Negative for shortness of breath.   Cardiovascular: Negative for chest pain, palpitations and leg swelling.  Gastrointestinal: Positive for nausea and vomiting. Negative for abdominal pain, diarrhea, constipation and abdominal distention.  Genitourinary: Negative for dysuria, frequency, flank pain and decreased urine volume.  Neurological: Negative for dizziness, speech difficulty, light-headedness and headaches.  All other systems reviewed and are negative.     Allergies  Avandia; Hydrocortisone; and Pioglitazone  Home Medications   Prior to Admission medications   Medication Sig Start Date End Date Taking? Authorizing Provider  albuterol (VENTOLIN HFA) 108 (90 BASE) MCG/ACT inhaler Inhale 2 puffs into the lungs every 6 (six) hours as needed. Shortness of breath 07/09/13  Yes Wilber Oliphant, MD  amLODipine (NORVASC) 10 MG tablet Take 10 mg by mouth daily. 04/08/14 06/01/15 Yes Wilber Oliphant, MD  aspirin EC 81 MG tablet Take 81 mg by mouth daily.   Yes Historical Provider, MD  atorvastatin (LIPITOR) 80 MG tablet Take 1 tablet (80 mg total) by mouth at bedtime. 01/30/13  Yes Otho Bellows, MD  citalopram (CELEXA) 20 MG tablet Take 1 tablet (20 mg total) by mouth daily. 01/30/13  Yes Otho Bellows, MD  furosemide (LASIX) 40 MG tablet Take 1 tablet (40 mg total) by mouth 2 (two) times daily. 04/08/13  Yes Wilber Oliphant, MD  gabapentin (NEURONTIN) 300 MG capsule Take 1 capsule (300 mg total) by mouth at bedtime. 01/30/13  Yes Otho Bellows, MD  insulin glargine (LANTUS) 100 UNIT/ML injection Inject 44 Units into the skin daily with breakfast. 04/08/13  Yes Wilber Oliphant, MD  insulin lispro (HUMALOG) 100 UNIT/ML injection Inject 8 units with breakfast and 12 units with lunch and 10 units with dinner 10/08/13  Yes Wilber Oliphant, MD  lisinopril (PRINIVIL,ZESTRIL) 40 MG tablet Take 40 mg by  mouth daily. 04/08/14  Yes Wilber Oliphant, MD  metoprolol (TOPROL-XL) 200 MG 24 hr tablet Take 1 tablet (200 mg total) by mouth daily. 01/30/13  Yes Otho Bellows, MD  spironolactone (ALDACTONE) 25 MG tablet Take 1 tablet (25 mg total) by mouth daily. 01/30/13  Yes Otho Bellows, MD  glucose blood (ONE TOUCH ULTRA TEST) test strip Use as instructed by doctor up to 3x daily,dx code 250.02 insulin requiring 05/20/13   Wilber Oliphant, MD  nitroGLYCERIN (NITROSTAT) 0.4 MG SL tablet Place 0.4 mg under the tongue every 5 (five) minutes  as needed for chest pain.    Historical Provider, MD   BP 168/81  Pulse 86  SpO2 99% Physical Exam  Nursing note and vitals reviewed. Constitutional: She appears well-developed and well-nourished. No distress.  HENT:  Head: Normocephalic and atraumatic.  Cardiovascular: Normal rate, regular rhythm, normal heart sounds and intact distal pulses.  Exam reveals no gallop and no friction rub.   No murmur heard. Pulmonary/Chest: Effort normal and breath sounds normal. No respiratory distress. She has no wheezes. She has no rales. She exhibits no tenderness.  Abdominal: Soft. Bowel sounds are normal. She exhibits no distension and no mass. There is tenderness (mild TTP to LUQ and epigastric). There is no rebound and no guarding.  Lymphadenopathy:    She has no cervical adenopathy.  Skin: Skin is warm and dry. She is not diaphoretic.    ED Course  Procedures (including critical care time) Labs Review Labs Reviewed  CBC WITH DIFFERENTIAL - Abnormal; Notable for the following:    Neutrophils Relative % 79 (*)    All other components within normal limits  COMPREHENSIVE METABOLIC PANEL - Abnormal; Notable for the following:    Chloride 95 (*)    Glucose, Bld 393 (*)    Alkaline Phosphatase 124 (*)    Anion gap 19 (*)    All other components within normal limits  URINALYSIS, ROUTINE W REFLEX MICROSCOPIC - Abnormal; Notable for the following:    Glucose, UA >1000  (*)    Ketones, ur 15 (*)    All other components within normal limits  LIPASE, BLOOD - Abnormal; Notable for the following:    Lipase 61 (*)    All other components within normal limits  URINE MICROSCOPIC-ADD ON - Abnormal; Notable for the following:    Casts GRANULAR CAST (*)    All other components within normal limits  CBG MONITORING, ED - Abnormal; Notable for the following:    Glucose-Capillary 386 (*)    All other components within normal limits  BLOOD GAS, CORD  CBG MONITORING, ED    Imaging Review No results found.   EKG Interpretation None      MDM   63 yo AAF w/emesis. Please see HPI for details. On exam, pt in NAD, AFVSS. Neuro exam shows no deficits. Full strength and sensation. No HA, no CN deficits. A&o x4. Mild abd TTP in LUQ and epigastric area. DDx includes pancreatitis, gastritis, GERD, DKA/HHS. Do not suspect an intracranial pathology. Workup includes CBC, CMP, Lipase, UA. Bolus 1 L NS. Given zofran for nausea.  UA shows glucosuria and ketones. Ketones may be due to emesis for the past 36 hours. No sign of UTI. Glc 383. Gap of 19. Will obtain VBG and recheck Glc after bolus complete. Giving 5 U regular insulin.  No N/V after zofran. Pt feels better.   VBG shows normal pH and normal bicarb. Not in DKA. Lipase negative. Labs wnl. Stable for DC home w/continued PO hydration. Given zofran for home. Follow up to PCP this week. Strict return precautions include by mouth intolerance despite Zofran, dehydration, hematemesis, syncope or presyncope.   Final diagnoses:  Nausea and vomiting, vomiting of unspecified type    Pt was seen under the supervision of Dr. Ashok Cordia.     Sherian Maroon, MD 05/13/14 [phone]

## 2014-05-12 NOTE — ED Notes (Addendum)
Pt brought to triage via ems for n/v Tripped and fell at church yesterday did NOT hit head but since she has had n/v no diarrhea had felt like she is running a fever bp has been up and sugar has been high per ems 333 bp was 170 110 denies dysuria pt vomiting at triage

## 2014-05-12 NOTE — ED Notes (Signed)
Lipase to be added on to bloodwork collected. Spoke with Freda Munro from lab.

## 2014-05-12 NOTE — Discharge Instructions (Signed)
Dehydration, Adult Dehydration is when you lose more fluids from the body than you take in. Vital organs like the kidneys, brain, and heart cannot function without a proper amount of fluids and salt. Any loss of fluids from the body can cause dehydration.  CAUSES   Vomiting.  Diarrhea.  Excessive sweating.  Excessive urine output.  Fever. SYMPTOMS  Mild dehydration  Thirst.  Dry lips.  Slightly dry mouth. Moderate dehydration  Very dry mouth.  Sunken eyes.  Skin does not bounce back quickly when lightly pinched and released.  Dark urine and decreased urine production.  Decreased tear production.  Headache. Severe dehydration  Very dry mouth.  Extreme thirst.  Rapid, weak pulse (more than 100 beats per minute at rest).  Cold hands and feet.  Not able to sweat in spite of heat and temperature.  Rapid breathing.  Blue lips.  Confusion and lethargy.  Difficulty being awakened.  Minimal urine production.  No tears. DIAGNOSIS  Your caregiver will diagnose dehydration based on your symptoms and your exam. Blood and urine tests will help confirm the diagnosis. The diagnostic evaluation should also identify the cause of dehydration. TREATMENT  Treatment of mild or moderate dehydration can often be done at home by increasing the amount of fluids that you drink. It is best to drink small amounts of fluid more often. Drinking too much at one time can make vomiting worse. Refer to the home care instructions below. Severe dehydration needs to be treated at the hospital where you will probably be given intravenous (IV) fluids that contain water and electrolytes. HOME CARE INSTRUCTIONS   Ask your caregiver about specific rehydration instructions.  Drink enough fluids to keep your urine clear or pale yellow.  Drink small amounts frequently if you have nausea and vomiting.  Eat as you normally do.  Avoid:  Foods or drinks high in sugar.  Carbonated  drinks.  Juice.  Extremely hot or cold fluids.  Drinks with caffeine.  Fatty, greasy foods.  Alcohol.  Tobacco.  Overeating.  Gelatin desserts.  Wash your hands well to avoid spreading bacteria and viruses.  Only take over-the-counter or prescription medicines for pain, discomfort, or fever as directed by your caregiver.  Ask your caregiver if you should continue all prescribed and over-the-counter medicines.  Keep all follow-up appointments with your caregiver. SEEK MEDICAL CARE IF:  You have abdominal pain and it increases or stays in one area (localizes).  You have a rash, stiff neck, or severe headache.  You are irritable, sleepy, or difficult to awaken.  You are weak, dizzy, or extremely thirsty. SEEK IMMEDIATE MEDICAL CARE IF:   You are unable to keep fluids down or you get worse despite treatment.  You have frequent episodes of vomiting or diarrhea.  You have blood or green matter (bile) in your vomit.  You have blood in your stool or your stool looks black and tarry.  You have not urinated in 6 to 8 hours, or you have only urinated a small amount of very dark urine.  You have a fever.  You faint. MAKE SURE YOU:   Understand these instructions.  Will watch your condition.  Will get help right away if you are not doing well or get worse. Document Released: 09/19/2005 Document Revised: 12/12/2011 Document Reviewed: 05/09/2011 [employer] Patient Information 2015 [employer]. This information is not intended to replace advice given to you by your health care provider. Make sure you discuss any questions you have with your health care   provider.  

## 2014-05-15 ENCOUNTER — Encounter (HOSPITAL_COMMUNITY): Payer: Self-pay | Admitting: Emergency Medicine

## 2014-05-15 ENCOUNTER — Emergency Department (HOSPITAL_COMMUNITY)
Admission: EM | Admit: 2014-05-15 | Discharge: 2014-05-15 | Disposition: A | Discharge status: Home / Self Care | Payer: Medicare PPO | Attending: Emergency Medicine | Admitting: Emergency Medicine

## 2014-05-15 DIAGNOSIS — F329 Major depressive disorder, single episode, unspecified: Secondary | ICD-10-CM | POA: Insufficient documentation

## 2014-05-15 DIAGNOSIS — R5381 Other malaise: Secondary | ICD-10-CM | POA: Diagnosis not present

## 2014-05-15 DIAGNOSIS — Z7982 Long term (current) use of aspirin: Secondary | ICD-10-CM | POA: Diagnosis not present

## 2014-05-15 DIAGNOSIS — F17200 Nicotine dependence, unspecified, uncomplicated: Secondary | ICD-10-CM | POA: Diagnosis not present

## 2014-05-15 DIAGNOSIS — Z85828 Personal history of other malignant neoplasm of skin: Secondary | ICD-10-CM | POA: Insufficient documentation

## 2014-05-15 DIAGNOSIS — Z9889 Other specified postprocedural states: Secondary | ICD-10-CM | POA: Diagnosis not present

## 2014-05-15 DIAGNOSIS — J449 Chronic obstructive pulmonary disease, unspecified: Secondary | ICD-10-CM | POA: Insufficient documentation

## 2014-05-15 DIAGNOSIS — I10 Essential (primary) hypertension: Secondary | ICD-10-CM | POA: Insufficient documentation

## 2014-05-15 DIAGNOSIS — R112 Nausea with vomiting, unspecified: Secondary | ICD-10-CM | POA: Diagnosis present

## 2014-05-15 DIAGNOSIS — Z4502 Encounter for adjustment and management of automatic implantable cardiac defibrillator: Secondary | ICD-10-CM | POA: Insufficient documentation

## 2014-05-15 DIAGNOSIS — Z872 Personal history of diseases of the skin and subcutaneous tissue: Secondary | ICD-10-CM | POA: Diagnosis not present

## 2014-05-15 DIAGNOSIS — Z95810 Presence of automatic (implantable) cardiac defibrillator: Secondary | ICD-10-CM | POA: Diagnosis not present

## 2014-05-15 DIAGNOSIS — I5042 Chronic combined systolic (congestive) and diastolic (congestive) heart failure: Secondary | ICD-10-CM | POA: Diagnosis not present

## 2014-05-15 DIAGNOSIS — Z79899 Other long term (current) drug therapy: Secondary | ICD-10-CM | POA: Diagnosis not present

## 2014-05-15 DIAGNOSIS — F3289 Other specified depressive episodes: Secondary | ICD-10-CM | POA: Diagnosis not present

## 2014-05-15 DIAGNOSIS — E119 Type 2 diabetes mellitus without complications: Secondary | ICD-10-CM | POA: Insufficient documentation

## 2014-05-15 DIAGNOSIS — J4489 Other specified chronic obstructive pulmonary disease: Secondary | ICD-10-CM | POA: Insufficient documentation

## 2014-05-15 DIAGNOSIS — E785 Hyperlipidemia, unspecified: Secondary | ICD-10-CM | POA: Insufficient documentation

## 2014-05-15 DIAGNOSIS — E669 Obesity, unspecified: Secondary | ICD-10-CM | POA: Diagnosis not present

## 2014-05-15 DIAGNOSIS — R5383 Other fatigue: Secondary | ICD-10-CM | POA: Diagnosis not present

## 2014-05-15 LAB — I-STAT VENOUS BLOOD GAS, ED
Acid-Base Excess: 8 mmol/L — ABNORMAL HIGH (ref 0.0–2.0)
Bicarbonate: 33.5 mEq/L — ABNORMAL HIGH (ref 20.0–24.0)
O2 Saturation: 65 %
TCO2: 35 mmol/L (ref 0–100)
pCO2, Ven: 46.3 mmHg (ref 45.0–50.0)
pH, Ven: 7.468 — ABNORMAL HIGH (ref 7.250–7.300)
pO2, Ven: 32 mmHg (ref 30.0–45.0)

## 2014-05-15 LAB — CBC WITH DIFFERENTIAL/PLATELET
Basophils Absolute: 0 10*3/uL (ref 0.0–0.1)
Basophils Relative: 0 % (ref 0–1)
Eosinophils Absolute: 0.1 10*3/uL (ref 0.0–0.7)
Eosinophils Relative: 1 % (ref 0–5)
HCT: 46.5 % — ABNORMAL HIGH (ref 36.0–46.0)
Hemoglobin: 15.8 g/dL — ABNORMAL HIGH (ref 12.0–15.0)
Lymphocytes Relative: 21 % (ref 12–46)
Lymphs Abs: 2.3 10*3/uL (ref 0.7–4.0)
MCH: 28.6 pg (ref 26.0–34.0)
MCHC: 34 g/dL (ref 30.0–36.0)
MCV: 84.1 fL (ref 78.0–100.0)
Monocytes Absolute: 0.7 10*3/uL (ref 0.1–1.0)
Monocytes Relative: 7 % (ref 3–12)
Neutro Abs: 7.6 10*3/uL (ref 1.7–7.7)
Neutrophils Relative %: 71 % (ref 43–77)
Platelets: 279 10*3/uL (ref 150–400)
RBC: 5.53 MIL/uL — ABNORMAL HIGH (ref 3.87–5.11)
RDW: 14.1 % (ref 11.5–15.5)
WBC: 10.7 10*3/uL — ABNORMAL HIGH (ref 4.0–10.5)

## 2014-05-15 LAB — LIPASE, BLOOD: Lipase: 75 U/L — ABNORMAL HIGH (ref 11–59)

## 2014-05-15 LAB — RAPID URINE DRUG SCREEN, HOSP PERFORMED
Amphetamines: NOT DETECTED
Barbiturates: NOT DETECTED
Benzodiazepines: NOT DETECTED
Cocaine: NOT DETECTED
Opiates: NOT DETECTED
Tetrahydrocannabinol: NOT DETECTED

## 2014-05-15 LAB — URINALYSIS, ROUTINE W REFLEX MICROSCOPIC
Glucose, UA: >1000 mg/dL — AB
Hgb urine dipstick: NEGATIVE
Ketones, ur: 15 mg/dL — AB
Nitrite: NEGATIVE
Protein, ur: 30 mg/dL — AB
Specific Gravity, Urine: 1.034 — ABNORMAL HIGH (ref 1.005–1.030)
Urobilinogen, UA: 1 mg/dL (ref 0.0–1.0)
pH: 6 (ref 5.0–8.0)

## 2014-05-15 LAB — CBG MONITORING, ED: Glucose-Capillary: 273 mg/dL — ABNORMAL HIGH (ref 70–99)

## 2014-05-15 LAB — COMPREHENSIVE METABOLIC PANEL
ALT: 20 U/L (ref 0–35)
AST: 23 U/L (ref 0–37)
Albumin: 4.1 g/dL (ref 3.5–5.2)
Alkaline Phosphatase: 106 U/L (ref 39–117)
Anion gap: 18 — ABNORMAL HIGH (ref 5–15)
BUN: 18 mg/dL (ref 6–23)
CO2: 27 mEq/L (ref 19–32)
Calcium: 9.6 mg/dL (ref 8.4–10.5)
Chloride: 91 mEq/L — ABNORMAL LOW (ref 96–112)
Creatinine, Ser: 0.97 mg/dL (ref 0.50–1.10)
GFR calc Af Amer: 72 mL/min — ABNORMAL LOW (ref >90)
GFR calc non Af Amer: 62 mL/min — ABNORMAL LOW (ref >90)
Glucose, Bld: 289 mg/dL — ABNORMAL HIGH (ref 70–99)
Potassium: 3.3 mEq/L — ABNORMAL LOW (ref 3.7–5.3)
Sodium: 136 mEq/L — ABNORMAL LOW (ref 137–147)
Total Bilirubin: 0.8 mg/dL (ref 0.3–1.2)
Total Protein: 7.9 g/dL (ref 6.0–8.3)

## 2014-05-15 LAB — URINE MICROSCOPIC-ADD ON

## 2014-05-15 MED ORDER — SODIUM CHLORIDE 0.9 % IV BOLUS (SEPSIS)
500.0000 mL | Freq: Once | INTRAVENOUS | Status: AC
  Administered 2014-05-15: 500 mL via INTRAVENOUS

## 2014-05-15 MED ORDER — POTASSIUM CHLORIDE CRYS ER 20 MEQ PO TBCR
40.0000 meq | EXTENDED_RELEASE_TABLET | Freq: Once | ORAL | Status: AC
  Administered 2014-05-15: 40 meq via ORAL
  Filled 2014-05-15: qty 2

## 2014-05-15 MED ORDER — ONDANSETRON HCL 4 MG/2ML IJ SOLN
4.0000 mg | Freq: Once | INTRAMUSCULAR | Status: AC
  Administered 2014-05-15: 4 mg via INTRAVENOUS
  Filled 2014-05-15: qty 2

## 2014-05-15 NOTE — Discharge Instructions (Signed)
You were seen in the ED today for nausea and vomiting. You assessment was stable from Tuesday. You should fill your prescription from Tuesday for zofran. Please see attached the resource guide for selecting a PCP if your current PCP does not accept your insurance. You should schedule a follow-up appointment within the next week. Please seek medical attention or return to the ED if you have any new or worsening nausea/vomiting, abdominal pain, fevers, or worrisome medical condition.   Emergency Department Resource Guide 1) Find a Doctor and Pay Out of Pocket Although you won't have to find out who is covered by your insurance plan, it is a good idea to ask around and get recommendations. You will then need to call the office and see if the doctor you have chosen will accept you as a new patient and what types of options they offer for patients who are self-pay. Some doctors offer discounts or will set up payment plans for their patients who do not have insurance, but you will need to ask so you aren't surprised when you get to your appointment.  2) Contact Your Local Health Department Not all health departments have doctors that can see patients for sick visits, but many do, so it is worth a call to see if yours does. If you don't know where your local health department is, you can check in your phone book. The CDC also has a tool to help you locate your state's health department, and many state websites also have listings of all of their local health departments.  3) Find a [facility] If your illness is not likely to be very severe or complicated, you may want to try a walk in clinic. These are popping up all over the country in pharmacies, drugstores, and shopping centers. They're usually staffed by nurse practitioners or physician assistants that have been trained to treat common illnesses and complaints. They're usually fairly quick and inexpensive. However, if you have serious medical issues or  chronic medical problems, these are probably not your best option.  No Primary Care Doctor: - Call Health Connect at  [phone] - they can help you locate a primary care doctor that  accepts your insurance, provides certain services, etc. - Physician Referral Service- [phone]  Chronic Pain Problems: Organization         Address  Phone   Notes  [facility]  [phone] Patients need to be referred by their primary care doctor.   Medication Assistance: Organization         Address  Phone   Notes  [facility] Medication [facility] [address] [place], [place] [address] [phone] --Must be a resident of [facility] -- Must have NO insurance coverage whatsoever (no Medicaid/ Medicare, etc.) -- The pt. MUST have a primary care doctor that directs their care regularly and follows them in the community   [facility]ist  [phone]   [employer]  [phone]    Agencies that provide inexpensive medical care: Organization         Address  Phone   Notes  [place]  [phone]   [facility]    [phone]   [facility] [place], [place] [address] [phone]   [place] [address] [place] [phone]   Planned Parenthood    [phone]   [facility]    [phone]   [place] [address], [place] Phone:  [phone], Fax:  [phone] Hours of Operation:  9 am - 6 pm, M-F.  Also accepts Medicaid/Medicare and self-pay.  [facility] [place], [address], [place] Phone: [phone], Fax: [phone]. Hours of Operation:  8:30 am - 5:30 pm, M-F.  Also accepts Medicaid and self-pay.  [facility] [address] [place] Phone: [phone]   [place], [place], [place]  [phone], Ext. [phone] Mondays & Thursdays: 7-9 AM.  First 15 patients are seen on a first come, first serve basis.    [place] Providers:  Organization         Address  Phone   Notes  [facility] [address], [place] [phone] Also accepts self-pay patients.  [facility] [address], [place]  [phone]   [place], [address], [place] [phone]   [facility] [address] [place] [phone]   Lucianne Lei [address], [place]   [phone] Only accepts [place] Access [place] patients after they have their name applied to their card.   Self-Pay (no insurance) in [facility]:  Organization         Address  Phone   Notes  Sickle Cell Patients, [facility] [place] [phone]   [facility] [place] [phone]   [facility] [place]  [place], [place], [place] [phone]   [facility]/Dr. Osei-Bonsu  [address], [place] or [address], [place] [phone] Phone number for both [place] and [place] locations is the same.  [facility] [address] [place] [phone]   [facility] [address] [place] or [address] [phone]   [facility] [address] [place] [phone], phone; [phone], fax Sees patients 1st and 3rd Saturday of every month.  Must not qualify for public or private insurance (i.e. Medicaid, Medicare, [place] Health Choice, Veterans' Benefits)  Household income should be no more than 200% of the poverty level The clinic cannot treat you if you are pregnant or think you are pregnant  Sexually transmitted  diseases are not treated at the clinic.    Dental Care: Organization         Address  Phone  Notes  [facility] Department of [facility] [phone] Accepts children up to age 10 who are enrolled in [place] or [place]; pregnant women with a Medicaid card; and children who have applied for Medicaid or [place] Health Choice, but were declined, whose parents can pay a reduced fee at time of service.  [facility] Department of [facility]  [address], [place] [phone] Accepts children up to age 22 who are enrolled in [place] or [place]; pregnant women with a Medicaid card; and children who have applied for Medicaid or [place] Health Choice, but were declined, whose parents can pay a reduced fee at time of service.  [place]  Access PROGRAM  [address] [place] [phone] Patients are seen by appointment only. Walk-ins are not accepted. [place] will see patients 57 years of age and older. Monday - Tuesday (8am-5pm) Most Wednesdays (8:30-5pm) $30 per visit, cash only  [facility] Adult Dental Access PROGRAM  [address], [place] [facility] [phone] Patients are seen by appointment only. Walk-ins are not accepted. [place] will see patients 61 years of age and older. One Wednesday Evening (Monthly: Volunteer Based).  $30 per visit, cash only  [place]  [phone] for adults; Children under age 78, call Graduate Pediatric Dentistry at [phone]. Children aged 60-14, please call [phone] to request a pediatric application.  Dental services are provided in all areas of dental care including fillings, crowns and bridges, complete and partial dentures, implants, gum treatment, root canals, and extractions. Preventive care is also provided. Treatment is provided to both adults and children. Patients are selected via a  lottery and there is often a waiting list.   [facility] [address] [place]  [phone] [email or web address]   [facility] [address] [place], [place] [phone], Ext. [phone] Second and Fourth Thursday of each month, opens at 6:30 AM; Clinic ends at 9 AM.  Patients are seen on a first-come first-served basis, and a limited number are seen during each clinic.   [facility]  [address] Hillard Danker [place], [place] [phone]   Eligibility Requirements You must have lived in [place], [place], or [place] counties for at least the last three months.   You cannot be eligible for state or federal sponsored [employer], including [employer], [place], or [employer].   You generally cannot be eligible for healthcare insurance through your employer.    How to apply: Eligibility screenings are held every Tuesday and Wednesday afternoon from 1:00 pm until 4:00 pm. You do not need an appointment for the interview!  [facility] [address], [place], [place]   [place]  [place] Department  [place]  [phone]    Behavioral Health Resources in the Community: Intensive Outpatient Programs Organization         Address  Phone  Notes  [place], [place], [place] [phone]   [facility] Outpatient [address], [place], [place]   ADS: Alcohol & Drug Svcs [address], [place], [place]   [place] [address]  [place], [place] or [phone]   Substance Abuse Resources Organization         Address  Phone  Notes  Alcohol and Drug Services  [phone]   [place]  [phone]   [facility] [place]   Chinita Pester  [phone]   Residential &  Outpatient Substance Abuse Program  [phone]   Psychological Services Organization         Address  Phone  Notes  [facility] [place]  [place]  [phone]   [place] [address] [place] or [phone]    Mobile Crisis Teams Organization         Address  Phone  Notes  [facility], Mobile Crisis Care Unit  [phone]   [facility]  [address] [place],  [place] [address] [place] [phone]    Self-Help/Support Groups Organization         Address  Phone             Notes  [facility] [place] - variety of support groups  [place] Call for more information  Narcotics Anonymous (NA), [facility]aring [facility] [address], [employer] [place]  2 meetings at this location   Special educational needs teacher         Address  Phone  Notes  [facility] Residential Treatment [place],    [place]  [phone]   [facility]  [address] [place] [phone], [place], [place]   [place], [place] [phone] Admissions: 8am-3pm M-F  Incentives Substance [place] [address],    [place], [place] [phone]   [facility] [address] [place], [place], [place]   [facility] [address],  [place], [place]   [facility] Programs - Intensive Outpatient [address]een [address], [place], [place]   [facility] ([place] [place].,  [place], [place] [phone] or [phone]   [facility] Services (RTS) [address], [place], [place] Accepts Medicaid  [facility] [place] [address],  [place] [phone] Substance Abuse/Addiction Treatment   [facility] Organization          Address  Phone  Notes  [facility]  [phone]   Domenic Schwab, PhD [address] Arlis Port[place], [place]   [phone] or [phone]   [place], [place] [phone]   [facility] [address], [place], [place] [phone] Insurance/Medicaid/sponsorship through [facility] and Families [address]                                    [place], [place] [phone] [place] [address] [place], [place] [phone]    Dr. Adele Schilder  [phone]   [facility] Dept. 1) [address], [place] 2) [place] 3)  [place] [address], [place] [phone]  [phone]   [place] [phone] or [phone] (After Hours)

## 2014-05-15 NOTE — ED Provider Notes (Signed)
I saw and evaluated the patient, reviewed the resident's note and I agree with the findings and plan.   EKG Interpretation None        Houston Siren III, MD 05/15/14 [phone]

## 2014-05-15 NOTE — ED Notes (Signed)
Lab results reported to Dr. Doy Mince.

## 2014-05-15 NOTE — ED Notes (Signed)
Family called EMS for lethargy and continued nv since being seen here on Tuesday.  Pt was dx with nv with unknown cause and sent home.  Family states she has not improved since then.  Pt took 40 units of inuslin humalog/8 units lantus at 10 am.  EMS stated cbg of 277, ekg unremarkable and gcs of 15.

## 2014-05-15 NOTE — ED Provider Notes (Signed)
CSN: [id number]     Arrival date & time 05/15/14  1036 History   First MD Initiated Contact with Patient 05/15/14 1108     Chief Complaint  Patient presents with  . Hyperglycemia  . Emesis     (Consider location/radiation/quality/duration/timing/severity/associated sxs/prior Treatment) Patient is a 63 y.o. female presenting with hyperglycemia and vomiting.  Hyperglycemia Associated symptoms: abdominal pain, fatigue, nausea and vomiting   Associated symptoms: no chest pain, no diaphoresis, no dysuria, no fever, no polyuria and no shortness of breath   Emesis Associated symptoms: abdominal pain   Associated symptoms: no chills, no diarrhea and no headaches     [name] is a 63 year old woman with nonischemic CM s/p defibrillator, DM2, CHF who presents with persistent nausea and emesis. Last Sunday she fell at church without loss of consciousness and question of whether she hit her head. Since then she has been nauseous with nonbloody nonbilious emesis. She came to the ED on Tuesday and had a negative workup. She was sent home with a prescription for zofran she never filled. She has had persistent nausea and emesis since then. She does not think it has gotten better or worse. She has some vague abdominal aching as well. She is also fatigued. Last bowel movement two days ago. She was brought in this morning by her daughters w a CBG of 277.  Past Medical History  Diagnosis Date  . Hyperthyroidism, subclinical   . Post menopausal syndrome   . Obesity   . Cardiac defibrillator in situ     Atlas II VR (SJM) implanted by Dr Lovena Le  . Eczema   . Lipoma   . Chronic ulcer of leg   . Hyperlipidemia   . Chronic combined systolic and diastolic heart failure     a. EF 35-40% in past;  b. Echo 7/13:  EF 45-50%, Gr 2 diast dysfn, mild AI, mild MAC, trivial MR, mild LAE, PASP 47.  [name] NICM (nonischemic cardiomyopathy)   . Diabetes mellitus   . HTN (hypertension)   . Elevated alkaline phosphatase  level     GGT and 5'nucleotidase 8/13 normal  . Hx of cardiac cath     a. [place] 2003 normal;  b. LHC 6/13:  Mild calcification in the LM, o/w normal coronary arteries, EF 45%.   . Sleep apnea     pt denies 04/12/2013  . COPD (chronic obstructive pulmonary disease)     O2 at night  . Automatic implantable cardioverter-defibrillator in situ   . CHF (congestive heart failure)   . Depression   . Implantable cardioverter-defibrillator (ICD) generator end of life    Past Surgical History  Procedure Laterality Date  . Cardiac defibrillator placement  05/04/2007    SJM Atlas II VR ICD  . Hysteroscopy    . Cardiac defibrillator placement    . Abdominal hysterectomy    . Cardiac catheterization    . Insert / replace / remove pacemaker    . Tubal ligation    . Hernia repair    . Colonoscopy N/A 04/12/2013    Procedure: COLONOSCOPY;  Surgeon: Beryle Beams, MD;  Location: [facility];  Service: Endoscopy;  Laterality: N/A;  pt.has defibrilator   Family History  Problem Relation Age of Onset  . Stroke Mother   . Seizures Father   . Diabetes Sister   . Asthma Maternal Aunt     aunts  . Asthma Maternal Uncle     uncles  . Heart disease Father   .  Heart disease Paternal Aunt     aunts  . Heart disease Paternal Uncle     uncles  . Heart disease Maternal Aunt     aunts  . Heart disease Maternal Uncle     uncles  . Heart disease Maternal Grandfather    History  Substance Use Topics  . Smoking status: Light Tobacco Smoker -- 0.25 packs/day for 35 years    Types: Cigarettes  . Smokeless tobacco: Not on file     Comment: currently smoking 3-4 cigs daily or less.  . Alcohol Use: 0.6 oz/week    1 Glasses of wine per week   OB History   Grav Para Term Preterm Abortions TAB SAB Ect Mult Living                 Review of Systems  Constitutional: Positive for fatigue. Negative for fever, chills and diaphoresis.  Respiratory: Negative for cough and shortness of breath.    Cardiovascular: Negative for chest pain and palpitations.  Gastrointestinal: Positive for nausea, vomiting and abdominal pain. Negative for diarrhea, constipation and blood in stool.  Endocrine: Negative for polyuria.  Genitourinary: Negative for dysuria.  Neurological: Positive for weakness. Negative for speech difficulty, light-headedness, numbness and headaches.      Allergies  Avandia; Hydrocortisone; and Pioglitazone  Home Medications   Prior to Admission medications   Medication Sig Start Date End Date Taking? Authorizing Provider  albuterol (VENTOLIN HFA) 108 (90 BASE) MCG/ACT inhaler Inhale 2 puffs into the lungs every 6 (six) hours as needed. Shortness of breath 07/09/13   Wilber Oliphant, MD  amLODipine (NORVASC) 10 MG tablet Take 10 mg by mouth daily. 04/08/14 06/01/15  Wilber Oliphant, MD  aspirin EC 81 MG tablet Take 81 mg by mouth daily.    Historical Provider, MD  atorvastatin (LIPITOR) 80 MG tablet Take 1 tablet (80 mg total) by mouth at bedtime. 01/30/13   Otho Bellows, MD  citalopram (CELEXA) 20 MG tablet Take 1 tablet (20 mg total) by mouth daily. 01/30/13   Otho Bellows, MD  furosemide (LASIX) 40 MG tablet Take 1 tablet (40 mg total) by mouth 2 (two) times daily. 04/08/13   Wilber Oliphant, MD  gabapentin (NEURONTIN) 300 MG capsule Take 1 capsule (300 mg total) by mouth at bedtime. 01/30/13   Otho Bellows, MD  glucose blood (ONE TOUCH ULTRA TEST) test strip Use as instructed by doctor up to 3x daily,dx code 250.02 insulin requiring 05/20/13   Wilber Oliphant, MD  insulin glargine (LANTUS) 100 UNIT/ML injection Inject 44 Units into the skin daily with breakfast. 04/08/13   Wilber Oliphant, MD  insulin lispro (HUMALOG) 100 UNIT/ML injection Inject 8 units with breakfast and 12 units with lunch and 10 units with dinner 10/08/13   Wilber Oliphant, MD  lisinopril (PRINIVIL,ZESTRIL) 40 MG tablet Take 40 mg by mouth daily. 04/08/14   Wilber Oliphant, MD  metoprolol  (TOPROL-XL) 200 MG 24 hr tablet Take 1 tablet (200 mg total) by mouth daily. 01/30/13   Otho Bellows, MD  nitroGLYCERIN (NITROSTAT) 0.4 MG SL tablet Place 0.4 mg under the tongue every 5 (five) minutes as needed for chest pain.    Historical Provider, MD  ondansetron (ZOFRAN ODT) 4 MG disintegrating tablet Take 1 tablet (4 mg total) by mouth every 8 (eight) hours as needed for nausea or vomiting. 05/12/14   Sherian Maroon, MD  spironolactone (ALDACTONE) 25 MG tablet Take 1 tablet (25  mg total) by mouth daily. 01/30/13   Otho Bellows, MD   BP 171/99  Pulse 83  Temp(Src) 97.8 F (36.6 C) (Oral)  Resp 18  SpO2 97% Physical Exam  Constitutional: She is oriented to person, place, and time. She appears well-developed and well-nourished. No distress.  HENT:  Head: Normocephalic and atraumatic.  Mouth/Throat: Oropharynx is clear and moist.  No bruises or lacerations on scalp  Eyes: EOM are normal. Pupils are equal, round, and reactive to light. No scleral icterus.  Neck:  No carotid bruit  Cardiovascular: Normal rate, regular rhythm, normal heart sounds and intact distal pulses.  Exam reveals no gallop and no friction rub.   No murmur heard. Pulmonary/Chest: Effort normal and breath sounds normal. No respiratory distress.  Abdominal: Soft. Bowel sounds are normal. She exhibits no distension. There is tenderness.  Musculoskeletal: She exhibits no edema.  Neurological: She is alert and oriented to person, place, and time. She has normal reflexes. No cranial nerve deficit. She exhibits normal muscle tone. Coordination normal.  Skin: She is not diaphoretic.    ED Course  Procedures (including critical care time) Labs Review Labs Reviewed  COMPREHENSIVE METABOLIC PANEL - Abnormal; Notable for the following:    Sodium 136 (*)    Potassium 3.3 (*)    Chloride 91 (*)    Glucose, Bld 289 (*)    GFR calc non Af Amer 62 (*)    GFR calc Af Amer 72 (*)    Anion gap 18 (*)    All other components  within normal limits  CBC WITH DIFFERENTIAL - Abnormal; Notable for the following:    WBC 10.7 (*)    RBC 5.53 (*)    Hemoglobin 15.8 (*)    HCT 46.5 (*)    All other components within normal limits  URINALYSIS, ROUTINE W REFLEX MICROSCOPIC - Abnormal; Notable for the following:    Color, Urine AMBER (*)    APPearance CLOUDY (*)    Specific Gravity, Urine 1.034 (*)    Glucose, UA >1000 (*)    Bilirubin Urine MODERATE (*)    Ketones, ur 15 (*)    Protein, ur 30 (*)    Leukocytes, UA SMALL (*)    All other components within normal limits  LIPASE, BLOOD - Abnormal; Notable for the following:    Lipase 75 (*)    All other components within normal limits  URINE MICROSCOPIC-ADD ON - Abnormal; Notable for the following:    Squamous Epithelial / LPF MANY (*)    All other components within normal limits  CBG MONITORING, ED - Abnormal; Notable for the following:    Glucose-Capillary 273 (*)    All other components within normal limits  I-STAT VENOUS BLOOD GAS, ED - Abnormal; Notable for the following:    pH, Ven 7.468 (*)    Bicarbonate 33.5 (*)    Acid-Base Excess 8.0 (*)    All other components within normal limits  URINE RAPID DRUG SCREEN (HOSP PERFORMED)  CBG MONITORING, ED    Imaging Review No results found.   EKG Interpretation None      MDM   Final diagnoses:  None    12:40PM: Patient with nausea, emesis, and general fatigue since fall on Sunday. She was evaluated in the ED on Tuesday with assessment notable for glucose 383 and gap of 19, give 5 u regular insulin. She was discharged with unfilled zofran Rx and told to follow-up w PCP this week. Presenting CBG 273.  We will begin assessment with venous blood gas, CMP, CBC, lipase, UA, UDS and give her 500 cc NS bolus with zofran 4 mg iv and reassess.   1:51PM: Patient reports her nausea is improved with zofran. Labs notable for glucose 289 (last A1c 10.9 so she lives at this level), AG 18, WBC 10.7, UA w glucose >1000,  lipase 75, UDS negative. Patient has had hard time getting PCP due to insurance change and had not filled her zofran prescription. She is stable for discharge with no acute abdominal process and no neurologic deficits on exam or evidence of head trauma from Sunday. She will receive a resource list for PCPs and should fill her Rx for zofran from Tuesday.  Kelby Aline, MD 05/15/14 [phone]

## 2014-05-15 NOTE — ED Provider Notes (Signed)
I saw and evaluated the patient, reviewed the resident's note and I agree with the findings and plan.   EKG Interpretation None      63 yo female presenting with nausea and vomiting for several days and hyperglycemia.  She thinks she may have struck her head 5 days ago, but is unsure.  On exam, well appearing, nontoxic, alert, not distressed, normal respiratory effort, normal perfusion, abdomen soft and nontender. Her symptoms resolved after Zofran. She is not a metabolic crisis. She appears slightly dehydrated, so she was given some IV fluids cautiously secondary to her CHF. I do not think she needs head imaging and she is 5 days out from a questionable head injury which was mild at worst. She appears stable for discharge home with PCP followup.  Clinical Impression: 1. Nausea and vomiting, vomiting of unspecified type       Houston Siren III, MD 05/15/14 [phone]

## 2014-05-15 NOTE — ED Notes (Signed)
Pt reports nausea and vomiting since Sunday. Denies abdominal pain. States she was seen before for this but has not gotten better. She has a prescription for nausea medicine but she never got it filled.

## 2014-05-20 NOTE — ED Provider Notes (Signed)
I saw and evaluated the patient, reviewed the resident's note and I agree with the findings and plan.  Pt c/o nv. Emesis not bloody or bilious. No cp. abd soft nt. Labs.    Mirna Mires, MD 05/20/14 0930

## 2014-05-22 ENCOUNTER — Encounter: Payer: Self-pay | Admitting: Internal Medicine

## 2014-05-22 ENCOUNTER — Ambulatory Visit (INDEPENDENT_AMBULATORY_CARE_PROVIDER_SITE_OTHER): Payer: Commercial Managed Care - HMO | Admitting: Internal Medicine

## 2014-05-22 VITALS — BP 116/75 | HR 67 | Temp 97.8°F | Ht 60.0 in | Wt 226.1 lb

## 2014-05-22 DIAGNOSIS — R2241 Localized swelling, mass and lump, right lower limb: Secondary | ICD-10-CM

## 2014-05-22 DIAGNOSIS — E119 Type 2 diabetes mellitus without complications: Secondary | ICD-10-CM

## 2014-05-22 DIAGNOSIS — E0590 Thyrotoxicosis, unspecified without thyrotoxic crisis or storm: Secondary | ICD-10-CM | POA: Diagnosis not present

## 2014-05-22 DIAGNOSIS — R229 Localized swelling, mass and lump, unspecified: Secondary | ICD-10-CM | POA: Diagnosis not present

## 2014-05-22 DIAGNOSIS — I10 Essential (primary) hypertension: Secondary | ICD-10-CM

## 2014-05-22 DIAGNOSIS — F32A Depression, unspecified: Secondary | ICD-10-CM | POA: Insufficient documentation

## 2014-05-22 DIAGNOSIS — F3289 Other specified depressive episodes: Secondary | ICD-10-CM

## 2014-05-22 DIAGNOSIS — F329 Major depressive disorder, single episode, unspecified: Secondary | ICD-10-CM | POA: Diagnosis not present

## 2014-05-22 LAB — BASIC METABOLIC PANEL WITH GFR
BUN: 18 mg/dL (ref 6–23)
CO2: 31 mEq/L (ref 19–32)
Calcium: 9.1 mg/dL (ref 8.4–10.5)
Chloride: 100 mEq/L (ref 96–112)
Creat: 1.06 mg/dL (ref 0.50–1.10)
GFR, Est African American: 66 mL/min
GFR, Est Non African American: 57 mL/min — ABNORMAL LOW
Glucose, Bld: 143 mg/dL — ABNORMAL HIGH (ref 70–99)
Potassium: 4.1 mEq/L (ref 3.5–5.3)
Sodium: 142 mEq/L (ref 135–145)

## 2014-05-22 LAB — TSH: TSH: 0.581 u[IU]/mL (ref 0.350–4.500)

## 2014-05-22 LAB — GLUCOSE, CAPILLARY: Glucose-Capillary: 123 mg/dL — ABNORMAL HIGH (ref 70–99)

## 2014-05-22 MED ORDER — METFORMIN HCL 500 MG PO TABS: 500.0000 mg | ORAL_TABLET | Freq: Every day | ORAL | Status: DC

## 2014-05-22 MED ORDER — METOPROLOL SUCCINATE ER 200 MG PO TB24: 200.0000 mg | ORAL_TABLET | Freq: Every day | ORAL | Status: DC

## 2014-05-22 NOTE — Patient Instructions (Signed)
-  Start taking metformin 500 mg daily for diabetes -Will check your blood work today -I have refilled your toprol-xl -Will see if we can refer you to dermatology -Dr. Dareen Piano will see you back in 1 month, please bring your meter with you   General Instructions:   Thank you for bringing your medicines today. This helps [place] keep you safe from mistakes.   Progress Toward Treatment Goals:  Treatment Goal 04/08/2014  Hemoglobin A1C deteriorated  Blood pressure at goal  Stop smoking smoking the same amount  Prevent falls -    Self Care Goals & Plans:  Self Care Goal 05/22/2014  Manage my medications take my medicines as prescribed; bring my medications to every visit; refill my medications on time  Monitor my health check my feet daily; keep track of my blood glucose; bring my glucose meter and log to each visit  Eat healthy foods eat more vegetables; eat foods that are low in salt; eat baked foods instead of fried foods; eat fruit for snacks and desserts  Be physically active take a walk every day  Stop smoking -    Home Blood Glucose Monitoring 04/08/2014  Check my blood sugar 3 times a day  When to check my blood sugar before meals     Care Management & Community Referrals:  Referral 01/30/2013  Referrals made for care management support diabetes educator; nutritionist  Referrals made to community resources smoking cessation; nutrition

## 2014-05-22 NOTE — Assessment & Plan Note (Signed)
Assessment: Pt with history of progressively enlarging right inner thigh lesion with history of similar occurrence requiring excision who presents with mild pain without other concerning features.    Plan: -Refer to dermatology for biopsy and excision

## 2014-05-22 NOTE — Assessment & Plan Note (Addendum)
Assessment: Pt with moderately controlled hypertension compliant with four-class (CCB, ACEi, diuretic, & BB) anti-hypertensive therapy who presents with blood pressure of 116/75.   Plan:  -BP 116/75 at goal <140/90 -Continue amlodipine 10 mg daily, lisinopril 40 mg daily, furosemide 40 mg BID, metoprolol 200 mg daily, and spironolactone 25 mg dialy -Obtain BMP

## 2014-05-22 NOTE — Assessment & Plan Note (Signed)
Assessment: Pt with well-controlled depression compliant with SSRI therapy who presents with stable mood.   Plan:  -Pt reports she is no longer depressed and would like to discontinue use -At next visit with PCP, Dr Dareen Piano, start to taper citalopram 20 mg daily

## 2014-05-22 NOTE — Progress Notes (Signed)
Patient ID: [name], female   DOB: Mar 31, 1951, 63 y.o.   MRN: [id number]    Subjective:   Patient ID: [name] female   DOB: 1951-07-03 63 y.o.   MRN: [id number]  HPI: [name] is a 63 y.o. woman with past medical history of hypertension, insulin-dependent Type II DM, NICM with ICD, combined chronic CHF, and COPD who presents with follow-up of recent ED admission.   She was recently seen in the ED on 8/10 and 8/13 for nausea and vomiting and found to have hyperglycemia with glucose range 273-393. Lipase was mildly elevated at 75. She reports her nausea and vomitng have completely resolved.   Her last A1c was 10.9 on 04/08/14. She reports compliance with using Lantus 44 U and Humalog 05/13/11. She checks her blood sugar three times daily with average blood sugar in 100's. She forgot to bring her glucose meter in today. She denies symptomatic hypoglycemia but reports having polyuria, polydipsia, polyphagia, blurry vision, neuropathy (controlled on gabapentin), and recent right toenail. She tries to follow a healthy diet and exercise regularly. She has lost weight intentionally recently She reports she was on metformin a long time ago and had mild GI upset (loose stools).    She reports compliance with taking amlodipine, furosemide, lisinopril, and spironolactone. She reports mild LE swelling but denies headache, chest pain, palpitations, or lightheadedness.   She reports compliance with taking celexa daily but would like to come off of it because she is no longer depressed.    Past Medical History  Diagnosis Date  . Hyperthyroidism, subclinical   . Post menopausal syndrome   . Obesity   . Cardiac defibrillator in situ     Atlas II VR (SJM) implanted by Dr Lovena Le  . Eczema   . Lipoma   . Chronic ulcer of leg   . Hyperlipidemia   . Chronic combined systolic and diastolic heart failure     a. EF 35-40% in past;  b. Echo 7/13:  EF 45-50%, Gr 2 diast dysfn, mild AI, mild MAC,  trivial MR, mild LAE, PASP 47.  [name] NICM (nonischemic cardiomyopathy)   . Diabetes mellitus   . HTN (hypertension)   . Elevated alkaline phosphatase level     GGT and 5'nucleotidase 8/13 normal  . Hx of cardiac cath     a. [place] 2003 normal;  b. LHC 6/13:  Mild calcification in the LM, o/w normal coronary arteries, EF 45%.   . Sleep apnea     pt denies 04/12/2013  . COPD (chronic obstructive pulmonary disease)     O2 at night  . Automatic implantable cardioverter-defibrillator in situ   . CHF (congestive heart failure)   . Depression   . Implantable cardioverter-defibrillator (ICD) generator end of life    Current Outpatient Prescriptions  Medication Sig Dispense Refill  . albuterol (VENTOLIN HFA) 108 (90 BASE) MCG/ACT inhaler Inhale 2 puffs into the lungs every 6 (six) hours as needed. Shortness of breath  1 Inhaler  6  . amLODipine (NORVASC) 10 MG tablet Take 10 mg by mouth daily.      [name] aspirin EC 81 MG tablet Take 81 mg by mouth daily.      [name] atorvastatin (LIPITOR) 80 MG tablet Take 1 tablet (80 mg total) by mouth at bedtime.  30 tablet  11  . citalopram (CELEXA) 20 MG tablet Take 1 tablet (20 mg total) by mouth daily.  30 tablet  11  . furosemide (LASIX) 40 MG  tablet Take 1 tablet (40 mg total) by mouth 2 (two) times daily.  45 tablet  11  . gabapentin (NEURONTIN) 300 MG capsule Take 1 capsule (300 mg total) by mouth at bedtime.  30 capsule  11  . glucose blood (ONE TOUCH ULTRA TEST) test strip Use as instructed by doctor up to 3x daily,dx code 250.02 insulin requiring  100 each  5  . insulin glargine (LANTUS) 100 UNIT/ML injection Inject 44 Units into the skin daily with breakfast.      . insulin lispro (HUMALOG) 100 UNIT/ML injection Inject 8 units with breakfast and 12 units with lunch and 10 units with dinner  10 mL  5  . lisinopril (PRINIVIL,ZESTRIL) 40 MG tablet Take 40 mg by mouth daily.      . metoprolol (TOPROL-XL) 200 MG 24 hr tablet Take 1 tablet (200 mg total) by mouth  daily.  30 tablet  11  . nitroGLYCERIN (NITROSTAT) 0.4 MG SL tablet Place 0.4 mg under the tongue every 5 (five) minutes as needed for chest pain.      [name] ondansetron (ZOFRAN ODT) 4 MG disintegrating tablet Take 1 tablet (4 mg total) by mouth every 8 (eight) hours as needed for nausea or vomiting.  6 tablet  0  . spironolactone (ALDACTONE) 25 MG tablet Take 1 tablet (25 mg total) by mouth daily.  30 tablet  11   No current facility-administered medications for this visit.   Family History  Problem Relation Age of Onset  . Stroke Mother   . Seizures Father   . Diabetes Sister   . Asthma Maternal Aunt     aunts  . Asthma Maternal Uncle     uncles  . Heart disease Father   . Heart disease Paternal Aunt     aunts  . Heart disease Paternal Uncle     uncles  . Heart disease Maternal Aunt     aunts  . Heart disease Maternal Uncle     uncles  . Heart disease Maternal Grandfather    History   Social History  . Marital Status: Widowed    Spouse Name: [name]    Number of Children: N/A  . Years of Education: N/A   Occupational History  . disabled    Social History Main Topics  . Smoking status: Light Tobacco Smoker -- 0.25 packs/day for 35 years    Types: Cigarettes  . Smokeless tobacco: None     Comment: currently smoking 3-4 cigs daily or less.  . Alcohol Use: 0.6 oz/week    1 Glasses of wine per week  . Drug Use: No  . Sexual Activity: None   Other Topics Concern  . None   Social History Narrative   ** Merged History Encounter **       Married   Review of Systems: Review of Systems  Constitutional: Negative for fever and chills.  Eyes: Positive for blurred vision.  Respiratory: Negative for cough and shortness of breath.   Cardiovascular: Positive for leg swelling. Negative for chest pain and palpitations.  Gastrointestinal: Negative for nausea, vomiting, abdominal pain, diarrhea and constipation.  Genitourinary: Negative for dysuria, urgency and frequency.        Polyuria  Musculoskeletal: Negative for myalgias.  Skin:       Growing lesion on right thigh  Neurological: Negative for dizziness and headaches.  Endo/Heme/Allergies: Positive for polydipsia.  Psychiatric/Behavioral: Negative for depression.     Objective:  Physical Exam: Filed Vitals:   05/22/14 [id number]  BP: 116/75  Pulse: 67  Temp: 97.8 F (36.6 C)  TempSrc: Oral  Height: 5' (1.524 m)  Weight: 226 lb 1.6 oz (102.558 kg)  SpO2: 97%   Physical Exam  Constitutional: She is oriented to person, place, and time. She appears well-developed and well-nourished. No distress.  HENT:  Head: Normocephalic and atraumatic.  Right Ear: External ear normal.  Left Ear: External ear normal.  Nose: Nose normal.  Mouth/Throat: Oropharynx is clear and moist. No oropharyngeal exudate.  Eyes: Conjunctivae and EOM are normal. Pupils are equal, round, and reactive to light. Right eye exhibits no discharge. Left eye exhibits no discharge. No scleral icterus.  Neck: Normal range of motion. Neck supple.  Cardiovascular: Normal rate and regular rhythm.   Pulmonary/Chest: Effort normal and breath sounds normal. No respiratory distress. She has no wheezes. She has no rales.  Abdominal: Soft. Bowel sounds are normal. She exhibits no distension. There is no tenderness. There is no rebound and no guarding.  Musculoskeletal: Normal range of motion. She exhibits edema (trace bilateral LE). She exhibits no tenderness.  Neurological: She is alert and oriented to person, place, and time.  Skin: Skin is warm and dry. No rash noted. She is not diaphoretic. No erythema. No pallor.  2 cm pedunculated lesion on right inner thigh  Psychiatric: She has a normal mood and affect. Her behavior is normal. Judgment and thought content normal.    Assessment & Plan:   Please see problem list for problem-based assessment and plan

## 2014-05-22 NOTE — Assessment & Plan Note (Addendum)
Assessment: Pt with last A1c of 10.9 on 04/08/14 compliant with insulin therapy with no recent symptomatic hypoglycemia who presents with CBG of 123.  Plan: -A1c 10.9 not at goal <7, start metformin 500 mg daily (h/o mild GI intolerance in the past) and continue Lantus 44 U daily and Humalog 05/13/11. Pt instructed to bring in glucose meter at next visit for insulin adjustment if necessary.  -BP 116/75 at goal <140/90, continue amlodipine 10 mg daily, lisinopril 40 mg daily, furosemide 40 mg BID, metoprolol 200 mg daily, and spironolactone 25 mg daily -LDL 92 at goal <100, continue atorvastatin 80 mg daily -Last annual eye exam on 04/26/13, pt to schedule appt with [facility] -Last annual foot exam on 04/08/14 -Last annual urine microalbumin on 04/08/14 with proteinuria -Continue gabapentin 300 mg for chronic peripheral neuropathy -BMI 44.16 not at goal <30, encourage weight loss -Continue aspirin 81 mg daily for primary CVD prevention

## 2014-05-22 NOTE — Assessment & Plan Note (Signed)
Assessment: Pt with history of subclinical hyperthyroidism with last TSH 04/02/12 who presents with no symptoms of thyroid dysfunction.   Plan:  -Obtain TSH level  -Continue to monitor for symptoms of thyroid dysfunction

## 2014-05-27 ENCOUNTER — Encounter: Payer: Self-pay | Admitting: Internal Medicine

## 2014-05-27 NOTE — Progress Notes (Signed)
Internal Medicine Clinic Attending Date of visit: 05/27/2014   Case discussed with Dr. Naaman Plummer soon after the resident saw the patient.  We reviewed the resident's history and exam and pertinent patient test results.  I agree with the assessment, diagnosis, and plan of care documented in the resident's note.

## 2014-06-26 ENCOUNTER — Encounter: Payer: Commercial Managed Care - HMO | Admitting: Internal Medicine

## 2014-06-26 ENCOUNTER — Encounter: Payer: Self-pay | Admitting: Internal Medicine

## 2014-07-03 ENCOUNTER — Other Ambulatory Visit: Payer: Self-pay | Admitting: *Deleted

## 2014-07-03 DIAGNOSIS — I10 Essential (primary) hypertension: Secondary | ICD-10-CM

## 2014-07-03 DIAGNOSIS — I5042 Chronic combined systolic (congestive) and diastolic (congestive) heart failure: Secondary | ICD-10-CM

## 2014-07-04 MED ORDER — SPIRONOLACTONE 25 MG PO TABS: 25.0000 mg | ORAL_TABLET | Freq: Every day | ORAL | Status: DC

## 2014-07-04 MED ORDER — FUROSEMIDE 40 MG PO TABS
40.0000 mg | ORAL_TABLET | Freq: Two times a day (BID) | ORAL | Status: DC
Start: 2014-07-04 — End: 2014-07-18

## 2014-07-04 MED ORDER — GABAPENTIN 300 MG PO CAPS: 300.0000 mg | ORAL_CAPSULE | Freq: Every day | ORAL | Status: DC

## 2014-07-08 ENCOUNTER — Ambulatory Visit (INDEPENDENT_AMBULATORY_CARE_PROVIDER_SITE_OTHER): Payer: Commercial Managed Care - HMO | Admitting: Cardiology

## 2014-07-08 ENCOUNTER — Encounter: Payer: Self-pay | Admitting: Cardiology

## 2014-07-08 VITALS — BP 150/80 | HR 112 | Ht 60.0 in | Wt 224.0 lb

## 2014-07-08 DIAGNOSIS — F17200 Nicotine dependence, unspecified, uncomplicated: Secondary | ICD-10-CM

## 2014-07-08 DIAGNOSIS — I10 Essential (primary) hypertension: Secondary | ICD-10-CM | POA: Diagnosis not present

## 2014-07-08 DIAGNOSIS — I429 Cardiomyopathy, unspecified: Secondary | ICD-10-CM

## 2014-07-08 DIAGNOSIS — I5042 Chronic combined systolic (congestive) and diastolic (congestive) heart failure: Secondary | ICD-10-CM | POA: Diagnosis not present

## 2014-07-08 DIAGNOSIS — Z95810 Presence of automatic (implantable) cardiac defibrillator: Secondary | ICD-10-CM | POA: Diagnosis not present

## 2014-07-08 DIAGNOSIS — Z720 Tobacco use: Secondary | ICD-10-CM

## 2014-07-08 DIAGNOSIS — I428 Other cardiomyopathies: Secondary | ICD-10-CM

## 2014-07-08 NOTE — Assessment & Plan Note (Signed)
Patient is euvolemic on examination. Continue present dose of diuretics. 

## 2014-07-08 NOTE — Assessment & Plan Note (Signed)
Continue ACE inhibitor and beta blocker. 

## 2014-07-08 NOTE — Progress Notes (Signed)
HPI: FU chronic combined systolic and diastolic CHF. She has a hx of NICM with a pror EF of 35-40%, HTN, HL, DM2, s/p AICD implantation. Myoview 2008: no ischemia, EF 33%. LHC 6/13: Mild calcification in the LM, o/w normal coronary arteries, EF 45%. Last echocardiogram in June of 2015 showed ejection fraction 35-40%, could not rule out mural thrombus at the LV apex, elevated filling pressures, mild aortic and mitral regurgitation and mild left atrial enlargement. Since I last saw her, She has mild dyspnea on exertion but no orthopnea, PND, pedal edema, palpitations, syncope or chest pain.   Current Outpatient Prescriptions  Medication Sig Dispense Refill  . albuterol (VENTOLIN HFA) 108 (90 BASE) MCG/ACT inhaler Inhale 2 puffs into the lungs every 6 (six) hours as needed. Shortness of breath  1 Inhaler  6  . amLODipine (NORVASC) 10 MG tablet Take 10 mg by mouth daily.      [name] aspirin EC 81 MG tablet Take 81 mg by mouth daily.      [name] atorvastatin (LIPITOR) 80 MG tablet Take 1 tablet (80 mg total) by mouth at bedtime.  30 tablet  11  . citalopram (CELEXA) 20 MG tablet Take 1 tablet (20 mg total) by mouth daily.  30 tablet  11  . furosemide (LASIX) 40 MG tablet Take 1 tablet (40 mg total) by mouth 2 (two) times daily.  45 tablet  11  . gabapentin (NEURONTIN) 300 MG capsule Take 1 capsule (300 mg total) by mouth at bedtime.  30 capsule  11  . glucose blood (ONE TOUCH ULTRA TEST) test strip Use as instructed by doctor up to 3x daily,dx code 250.02 insulin requiring  100 each  5  . insulin glargine (LANTUS) 100 UNIT/ML injection Inject 44 Units into the skin daily with breakfast.      . insulin lispro (HUMALOG) 100 UNIT/ML injection Inject 8 units with breakfast and 12 units with lunch and 10 units with dinner  10 mL  5  . lisinopril (PRINIVIL,ZESTRIL) 40 MG tablet Take 40 mg by mouth daily.      . metFORMIN (GLUCOPHAGE) 500 MG tablet Take 1 tablet (500 mg total) by mouth daily with breakfast.  90  tablet  3  . metoprolol (TOPROL-XL) 200 MG 24 hr tablet Take 1 tablet (200 mg total) by mouth daily.  90 tablet  3  . nitroGLYCERIN (NITROSTAT) 0.4 MG SL tablet Place 0.4 mg under the tongue every 5 (five) minutes as needed for chest pain.      [name] spironolactone (ALDACTONE) 25 MG tablet Take 1 tablet (25 mg total) by mouth daily.  30 tablet  11   No current facility-administered medications for this visit.     Past Medical History  Diagnosis Date  . Hyperthyroidism, subclinical   . Post menopausal syndrome   . Obesity   . Cardiac defibrillator in situ     Atlas II VR (SJM) implanted by Dr Lovena Le  . Eczema   . Lipoma   . Chronic ulcer of leg   . Hyperlipidemia   . Chronic combined systolic and diastolic heart failure     a. EF 35-40% in past;  b. Echo 7/13:  EF 45-50%, Gr 2 diast dysfn, mild AI, mild MAC, trivial MR, mild LAE, PASP 47.  [name] NICM (nonischemic cardiomyopathy)   . Diabetes mellitus   . HTN (hypertension)   . Elevated alkaline phosphatase level     GGT and 5'nucleotidase 8/13 normal  . Hx  of cardiac cath     a. [place] 2003 normal;  b. LHC 6/13:  Mild calcification in the LM, o/w normal coronary arteries, EF 45%.   . Sleep apnea     pt denies 04/12/2013  . COPD (chronic obstructive pulmonary disease)     O2 at night  . Automatic implantable cardioverter-defibrillator in situ   . CHF (congestive heart failure)   . Depression   . Implantable cardioverter-defibrillator (ICD) generator end of life     Past Surgical History  Procedure Laterality Date  . Cardiac defibrillator placement  05/04/2007    SJM Atlas II VR ICD  . Hysteroscopy    . Cardiac defibrillator placement    . Abdominal hysterectomy    . Cardiac catheterization    . Insert / replace / remove pacemaker    . Tubal ligation    . Hernia repair    . Colonoscopy N/A 04/12/2013    Procedure: COLONOSCOPY;  Surgeon: Beryle Beams, MD;  Location: [facility];  Service: Endoscopy;  Laterality: N/A;  pt.has  defibrilator    History   Social History  . Marital Status: Widowed    Spouse Name: Alroy Dust    Number of Children: N/A  . Years of Education: N/A   Occupational History  . disabled    Social History Main Topics  . Smoking status: Light Tobacco Smoker -- 0.25 packs/day for 35 years    Types: Cigarettes  . Smokeless tobacco: Not on file     Comment: currently smoking 3-4 cigs daily or less.  . Alcohol Use: 0.6 oz/week    1 Glasses of wine per week  . Drug Use: No  . Sexual Activity: Not on file   Other Topics Concern  . Not on file   Social History Narrative   ** Merged History Encounter **       Married    ROS: no fevers or chills, productive cough, hemoptysis, dysphasia, odynophagia, melena, hematochezia, dysuria, hematuria, rash, seizure activity, orthopnea, PND, pedal edema, claudication. Remaining systems are negative.  Physical Exam: Well-developed obese in no acute distress.  Skin is warm and dry.  HEENT is normal.  Neck is supple.  Chest is clear to auscultation with normal expansion.  Cardiovascular exam is regular rate and rhythm.  Abdominal exam nontender or distended. No masses palpated. Extremities show no edema. neuro grossly intact  ECG Sinus tachycardia at a rate of 112. Normal axis. Anterior lateral T-wave inversion.

## 2014-07-08 NOTE — Patient Instructions (Signed)
Your physician wants you to follow-up in: 6 MONTHS WITH DR CRENSHAW You will receive a reminder letter in the mail two months in advance. If you don't receive a letter, please call our office to schedule the follow-up appointment.  

## 2014-07-08 NOTE — Assessment & Plan Note (Signed)
Patient counseled on discontinuing. 

## 2014-07-08 NOTE — Assessment & Plan Note (Signed)
Followed by electrophysiology. 

## 2014-07-08 NOTE — Assessment & Plan Note (Signed)
Blood pressure controlled. Continue present medications. 

## 2014-07-08 NOTE — Assessment & Plan Note (Signed)
Continue statin. 

## 2014-07-12 ENCOUNTER — Inpatient Hospital Stay (HOSPITAL_COMMUNITY)
Admission: EM | Admit: 2014-07-12 | Discharge: 2014-07-18 | DRG: 073 | Disposition: A | Discharge status: Home / Self Care | Payer: Medicare PPO | Attending: Internal Medicine | Admitting: Internal Medicine

## 2014-07-12 ENCOUNTER — Emergency Department (HOSPITAL_COMMUNITY): Payer: Medicare PPO

## 2014-07-12 ENCOUNTER — Encounter (HOSPITAL_COMMUNITY): Payer: Self-pay | Admitting: Emergency Medicine

## 2014-07-12 DIAGNOSIS — E785 Hyperlipidemia, unspecified: Secondary | ICD-10-CM | POA: Diagnosis present

## 2014-07-12 DIAGNOSIS — I5021 Acute systolic (congestive) heart failure: Secondary | ICD-10-CM | POA: Diagnosis not present

## 2014-07-12 DIAGNOSIS — Z95810 Presence of automatic (implantable) cardiac defibrillator: Secondary | ICD-10-CM | POA: Diagnosis present

## 2014-07-12 DIAGNOSIS — F17210 Nicotine dependence, cigarettes, uncomplicated: Secondary | ICD-10-CM | POA: Diagnosis present

## 2014-07-12 DIAGNOSIS — I959 Hypotension, unspecified: Secondary | ICD-10-CM | POA: Diagnosis present

## 2014-07-12 DIAGNOSIS — K3184 Gastroparesis: Secondary | ICD-10-CM | POA: Diagnosis present

## 2014-07-12 DIAGNOSIS — I428 Other cardiomyopathies: Secondary | ICD-10-CM

## 2014-07-12 DIAGNOSIS — R339 Retention of urine, unspecified: Secondary | ICD-10-CM | POA: Diagnosis present

## 2014-07-12 DIAGNOSIS — R1114 Bilious vomiting: Secondary | ICD-10-CM

## 2014-07-12 DIAGNOSIS — N170 Acute kidney failure with tubular necrosis: Secondary | ICD-10-CM | POA: Diagnosis not present

## 2014-07-12 DIAGNOSIS — E119 Type 2 diabetes mellitus without complications: Secondary | ICD-10-CM

## 2014-07-12 DIAGNOSIS — E1143 Type 2 diabetes mellitus with diabetic autonomic (poly)neuropathy: Principal | ICD-10-CM | POA: Diagnosis present

## 2014-07-12 DIAGNOSIS — I509 Heart failure, unspecified: Secondary | ICD-10-CM | POA: Insufficient documentation

## 2014-07-12 DIAGNOSIS — N179 Acute kidney failure, unspecified: Secondary | ICD-10-CM

## 2014-07-12 DIAGNOSIS — I5042 Chronic combined systolic (congestive) and diastolic (congestive) heart failure: Secondary | ICD-10-CM | POA: Diagnosis present

## 2014-07-12 DIAGNOSIS — E669 Obesity, unspecified: Secondary | ICD-10-CM | POA: Diagnosis present

## 2014-07-12 DIAGNOSIS — J449 Chronic obstructive pulmonary disease, unspecified: Secondary | ICD-10-CM | POA: Diagnosis present

## 2014-07-12 DIAGNOSIS — I429 Cardiomyopathy, unspecified: Secondary | ICD-10-CM | POA: Diagnosis present

## 2014-07-12 DIAGNOSIS — Z794 Long term (current) use of insulin: Secondary | ICD-10-CM

## 2014-07-12 DIAGNOSIS — E1129 Type 2 diabetes mellitus with other diabetic kidney complication: Secondary | ICD-10-CM

## 2014-07-12 DIAGNOSIS — E1165 Type 2 diabetes mellitus with hyperglycemia: Secondary | ICD-10-CM | POA: Diagnosis present

## 2014-07-12 DIAGNOSIS — I10 Essential (primary) hypertension: Secondary | ICD-10-CM | POA: Diagnosis present

## 2014-07-12 DIAGNOSIS — R112 Nausea with vomiting, unspecified: Secondary | ICD-10-CM | POA: Diagnosis present

## 2014-07-12 DIAGNOSIS — E1142 Type 2 diabetes mellitus with diabetic polyneuropathy: Secondary | ICD-10-CM

## 2014-07-12 HISTORY — DX: Acute kidney failure with tubular necrosis: N17.0

## 2014-07-12 LAB — CBC WITH DIFFERENTIAL/PLATELET
Basophils Absolute: 0 10*3/uL (ref 0.0–0.1)
Basophils Relative: 0 % (ref 0–1)
Eosinophils Absolute: 0.1 10*3/uL (ref 0.0–0.7)
Eosinophils Relative: 1 % (ref 0–5)
HCT: 44.2 % (ref 36.0–46.0)
Hemoglobin: 14.7 g/dL (ref 12.0–15.0)
Lymphocytes Relative: 18 % (ref 12–46)
Lymphs Abs: 1.5 10*3/uL (ref 0.7–4.0)
MCH: 28.4 pg (ref 26.0–34.0)
MCHC: 33.3 g/dL (ref 30.0–36.0)
MCV: 85.3 fL (ref 78.0–100.0)
Monocytes Absolute: 0.3 10*3/uL (ref 0.1–1.0)
Monocytes Relative: 4 % (ref 3–12)
Neutro Abs: 6.7 10*3/uL (ref 1.7–7.7)
Neutrophils Relative %: 77 % (ref 43–77)
Platelets: 183 10*3/uL (ref 150–400)
RBC: 5.18 MIL/uL — ABNORMAL HIGH (ref 3.87–5.11)
RDW: 15.4 % (ref 11.5–15.5)
WBC: 8.6 10*3/uL (ref 4.0–10.5)

## 2014-07-12 LAB — BASIC METABOLIC PANEL
Anion gap: 15 (ref 5–15)
BUN: 9 mg/dL (ref 6–23)
CO2: 23 mEq/L (ref 19–32)
Calcium: 9.5 mg/dL (ref 8.4–10.5)
Chloride: 98 mEq/L (ref 96–112)
Creatinine, Ser: 0.76 mg/dL (ref 0.50–1.10)
GFR calc Af Amer: >90 mL/min (ref >90)
GFR calc non Af Amer: 90 mL/min — ABNORMAL LOW (ref >90)
Glucose, Bld: 208 mg/dL — ABNORMAL HIGH (ref 70–99)
Potassium: 4 mEq/L (ref 3.7–5.3)
Sodium: 136 mEq/L — ABNORMAL LOW (ref 137–147)

## 2014-07-12 LAB — URINALYSIS, ROUTINE W REFLEX MICROSCOPIC
Bilirubin Urine: NEGATIVE
Glucose, UA: >1000 mg/dL — AB
Hgb urine dipstick: NEGATIVE
Ketones, ur: 40 mg/dL — AB
Leukocytes, UA: NEGATIVE
Nitrite: NEGATIVE
Protein, ur: NEGATIVE mg/dL
Specific Gravity, Urine: 1.021 (ref 1.005–1.030)
Urobilinogen, UA: 1 mg/dL (ref 0.0–1.0)
pH: 6 (ref 5.0–8.0)

## 2014-07-12 LAB — COMPREHENSIVE METABOLIC PANEL
ALT: 17 U/L (ref 0–35)
AST: 32 U/L (ref 0–37)
Albumin: 3.8 g/dL (ref 3.5–5.2)
Alkaline Phosphatase: 117 U/L (ref 39–117)
Anion gap: 19 — ABNORMAL HIGH (ref 5–15)
BUN: 11 mg/dL (ref 6–23)
CO2: 21 mEq/L (ref 19–32)
Calcium: 9.2 mg/dL (ref 8.4–10.5)
Chloride: 97 mEq/L (ref 96–112)
Creatinine, Ser: 0.68 mg/dL (ref 0.50–1.10)
GFR calc Af Amer: >90 mL/min (ref >90)
GFR calc non Af Amer: >90 mL/min (ref >90)
Glucose, Bld: 339 mg/dL — ABNORMAL HIGH (ref 70–99)
Potassium: 5.5 mEq/L — ABNORMAL HIGH (ref 3.7–5.3)
Sodium: 137 mEq/L (ref 137–147)
Total Bilirubin: 0.7 mg/dL (ref 0.3–1.2)
Total Protein: 7.5 g/dL (ref 6.0–8.3)

## 2014-07-12 LAB — URINE MICROSCOPIC-ADD ON

## 2014-07-12 LAB — CBG MONITORING, ED
Glucose-Capillary: 327 mg/dL — ABNORMAL HIGH (ref 70–99)
Glucose-Capillary: 320 mg/dL — ABNORMAL HIGH (ref 70–99)
Glucose-Capillary: 292 mg/dL — ABNORMAL HIGH (ref 70–99)
Glucose-Capillary: 237 mg/dL — ABNORMAL HIGH (ref 70–99)
Glucose-Capillary: 177 mg/dL — ABNORMAL HIGH (ref 70–99)

## 2014-07-12 LAB — POTASSIUM: Potassium: 4.6 mEq/L (ref 3.7–5.3)

## 2014-07-12 LAB — PRO B NATRIURETIC PEPTIDE: Pro B Natriuretic peptide (BNP): 718.4 pg/mL — ABNORMAL HIGH (ref 0–125)

## 2014-07-12 LAB — GLUCOSE, CAPILLARY: Glucose-Capillary: 293 mg/dL — ABNORMAL HIGH (ref 70–99)

## 2014-07-12 LAB — LIPASE, BLOOD: Lipase: 35 U/L (ref 11–59)

## 2014-07-12 LAB — TROPONIN I
Troponin I: <0.3 ng/mL (ref <0.30)
Troponin I: <0.3 ng/mL (ref <0.30)

## 2014-07-12 MED ORDER — ONDANSETRON HCL 4 MG/2ML IJ SOLN
4.0000 mg | Freq: Four times a day (QID) | INTRAMUSCULAR | Status: DC | PRN
  Administered 2014-07-13: 4 mg via INTRAVENOUS
  Filled 2014-07-12 (×2): qty 2

## 2014-07-12 MED ORDER — ONDANSETRON HCL 4 MG PO TABS
4.0000 mg | ORAL_TABLET | Freq: Four times a day (QID) | ORAL | Status: DC | PRN
  Administered 2014-07-13: 4 mg via ORAL
  Filled 2014-07-12 (×2): qty 1

## 2014-07-12 MED ORDER — FUROSEMIDE 10 MG/ML IJ SOLN
20.0000 mg | Freq: Once | INTRAMUSCULAR | Status: AC
  Administered 2014-07-12: 20 mg via INTRAVENOUS
  Filled 2014-07-12: qty 4

## 2014-07-12 MED ORDER — FUROSEMIDE 40 MG PO TABS
40.0000 mg | ORAL_TABLET | Freq: Two times a day (BID) | ORAL | Status: DC
  Administered 2014-07-12 – 2014-07-14 (×4): 40 mg via ORAL
  Filled 2014-07-12 (×6): qty 1

## 2014-07-12 MED ORDER — SPIRONOLACTONE 25 MG PO TABS
25.0000 mg | ORAL_TABLET | Freq: Every day | ORAL | Status: DC
  Administered 2014-07-12 – 2014-07-13 (×2): 25 mg via ORAL
  Filled 2014-07-12 (×4): qty 1

## 2014-07-12 MED ORDER — ONDANSETRON HCL 4 MG/2ML IJ SOLN
4.0000 mg | Freq: Once | INTRAMUSCULAR | Status: AC
  Administered 2014-07-12: 4 mg via INTRAVENOUS
  Filled 2014-07-12: qty 2

## 2014-07-12 MED ORDER — PNEUMOCOCCAL VAC POLYVALENT 25 MCG/0.5ML IJ INJ
0.5000 mL | INJECTION | INTRAMUSCULAR | Status: AC
  Administered 2014-07-13: 0.5 mL via INTRAMUSCULAR
  Filled 2014-07-12: qty 0.5

## 2014-07-12 MED ORDER — ATORVASTATIN CALCIUM 80 MG PO TABS
80.0000 mg | ORAL_TABLET | Freq: Every day | ORAL | Status: DC
  Administered 2014-07-12 – 2014-07-17 (×6): 80 mg via ORAL
  Filled 2014-07-12 (×7): qty 1

## 2014-07-12 MED ORDER — SODIUM CHLORIDE 0.9 % IV BOLUS (SEPSIS)
1000.0000 mL | Freq: Once | INTRAVENOUS | Status: AC
  Administered 2014-07-12: 1000 mL via INTRAVENOUS

## 2014-07-12 MED ORDER — NITROGLYCERIN 0.4 MG SL SUBL: 0.4000 mg | SUBLINGUAL_TABLET | SUBLINGUAL | Status: DC | PRN

## 2014-07-12 MED ORDER — CITALOPRAM HYDROBROMIDE 20 MG PO TABS
20.0000 mg | ORAL_TABLET | Freq: Every day | ORAL | Status: DC
  Administered 2014-07-12 – 2014-07-18 (×7): 20 mg via ORAL
  Filled 2014-07-12 (×8): qty 1

## 2014-07-12 MED ORDER — INSULIN ASPART 100 UNIT/ML ~~LOC~~ SOLN
6.0000 [IU] | Freq: Three times a day (TID) | SUBCUTANEOUS | Status: DC
  Administered 2014-07-12 – 2014-07-17 (×13): 6 [IU] via SUBCUTANEOUS

## 2014-07-12 MED ORDER — METOPROLOL SUCCINATE ER 100 MG PO TB24
200.0000 mg | ORAL_TABLET | Freq: Every day | ORAL | Status: DC
  Administered 2014-07-12 – 2014-07-14 (×3): 200 mg via ORAL
  Filled 2014-07-12 (×5): qty 2

## 2014-07-12 MED ORDER — ENOXAPARIN SODIUM 40 MG/0.4ML ~~LOC~~ SOLN
40.0000 mg | SUBCUTANEOUS | Status: DC
  Administered 2014-07-12 – 2014-07-14 (×3): 40 mg via SUBCUTANEOUS
  Filled 2014-07-12 (×4): qty 0.4

## 2014-07-12 MED ORDER — INSULIN ASPART 100 UNIT/ML ~~LOC~~ SOLN
0.0000 [IU] | Freq: Every day | SUBCUTANEOUS | Status: DC
  Administered 2014-07-12 – 2014-07-14 (×2): 3 [IU] via SUBCUTANEOUS
  Administered 2014-07-15 – 2014-07-17 (×2): 2 [IU] via SUBCUTANEOUS

## 2014-07-12 MED ORDER — INSULIN ASPART 100 UNIT/ML ~~LOC~~ SOLN
0.0000 [IU] | Freq: Three times a day (TID) | SUBCUTANEOUS | Status: DC
  Administered 2014-07-13: 3 [IU] via SUBCUTANEOUS
  Administered 2014-07-13: 5 [IU] via SUBCUTANEOUS
  Administered 2014-07-13: 3 [IU] via SUBCUTANEOUS
  Administered 2014-07-14: 5 [IU] via SUBCUTANEOUS
  Administered 2014-07-15: 3 [IU] via SUBCUTANEOUS
  Administered 2014-07-15 (×2): 5 [IU] via SUBCUTANEOUS
  Administered 2014-07-16: 8 [IU] via SUBCUTANEOUS
  Administered 2014-07-16: 3 [IU] via SUBCUTANEOUS
  Administered 2014-07-16 – 2014-07-17 (×2): 2 [IU] via SUBCUTANEOUS
  Administered 2014-07-17 (×2): 3 [IU] via SUBCUTANEOUS
  Administered 2014-07-18: 1 [IU] via SUBCUTANEOUS
  Administered 2014-07-18: 2 [IU] via SUBCUTANEOUS

## 2014-07-12 MED ORDER — ASPIRIN EC 81 MG PO TBEC
81.0000 mg | DELAYED_RELEASE_TABLET | Freq: Every day | ORAL | Status: DC
  Administered 2014-07-12 – 2014-07-18 (×7): 81 mg via ORAL
  Filled 2014-07-12 (×8): qty 1

## 2014-07-12 MED ORDER — ACETAMINOPHEN 650 MG RE SUPP: 650.0000 mg | Freq: Four times a day (QID) | RECTAL | Status: DC | PRN

## 2014-07-12 MED ORDER — AMLODIPINE BESYLATE 10 MG PO TABS
10.0000 mg | ORAL_TABLET | Freq: Every day | ORAL | Status: DC
  Administered 2014-07-12 – 2014-07-13 (×2): 10 mg via ORAL
  Filled 2014-07-12 (×4): qty 1

## 2014-07-12 MED ORDER — INSULIN ASPART 100 UNIT/ML ~~LOC~~ SOLN: 0.0000 [IU] | Freq: Every day | SUBCUTANEOUS | Status: DC

## 2014-07-12 MED ORDER — ALBUTEROL SULFATE (2.5 MG/3ML) 0.083% IN NEBU: 2.5000 mg | INHALATION_SOLUTION | Freq: Four times a day (QID) | RESPIRATORY_TRACT | Status: DC | PRN

## 2014-07-12 MED ORDER — GABAPENTIN 300 MG PO CAPS
300.0000 mg | ORAL_CAPSULE | Freq: Every day | ORAL | Status: DC
  Administered 2014-07-12 – 2014-07-17 (×6): 300 mg via ORAL
  Filled 2014-07-12 (×7): qty 1

## 2014-07-12 MED ORDER — LISINOPRIL 40 MG PO TABS
40.0000 mg | ORAL_TABLET | Freq: Every day | ORAL | Status: DC
  Administered 2014-07-12 – 2014-07-13 (×2): 40 mg via ORAL
  Filled 2014-07-12 (×4): qty 1

## 2014-07-12 MED ORDER — INSULIN ASPART 100 UNIT/ML ~~LOC~~ SOLN: 0.0000 [IU] | Freq: Three times a day (TID) | SUBCUTANEOUS | Status: DC

## 2014-07-12 MED ORDER — ALBUTEROL SULFATE HFA 108 (90 BASE) MCG/ACT IN AERS: 2.0000 | INHALATION_SPRAY | Freq: Four times a day (QID) | RESPIRATORY_TRACT | Status: DC | PRN

## 2014-07-12 MED ORDER — INSULIN ASPART 100 UNIT/ML ~~LOC~~ SOLN
0.0000 [IU] | Freq: Three times a day (TID) | SUBCUTANEOUS | Status: DC
  Administered 2014-07-12: 8 [IU] via SUBCUTANEOUS

## 2014-07-12 MED ORDER — INSULIN GLARGINE 100 UNIT/ML ~~LOC~~ SOLN
30.0000 [IU] | Freq: Every day | SUBCUTANEOUS | Status: DC
  Administered 2014-07-13 – 2014-07-18 (×6): 30 [IU] via SUBCUTANEOUS
  Filled 2014-07-12 (×7): qty 0.3

## 2014-07-12 MED ORDER — INSULIN ASPART 100 UNIT/ML ~~LOC~~ SOLN: 6.0000 [IU] | Freq: Three times a day (TID) | SUBCUTANEOUS | Status: DC

## 2014-07-12 MED ORDER — INFLUENZA VAC SPLIT QUAD 0.5 ML IM SUSY
0.5000 mL | PREFILLED_SYRINGE | INTRAMUSCULAR | Status: AC
  Administered 2014-07-13: 0.5 mL via INTRAMUSCULAR
  Filled 2014-07-12: qty 0.5

## 2014-07-12 MED ORDER — SODIUM CHLORIDE 0.9 % IV SOLN
INTRAVENOUS | Status: DC
  Administered 2014-07-12: 2.7 [IU]/h via INTRAVENOUS
  Administered 2014-07-12: 5.2 [IU]/h via INTRAVENOUS
  Filled 2014-07-12: qty 2.5

## 2014-07-12 MED ORDER — ACETAMINOPHEN 325 MG PO TABS: 650.0000 mg | ORAL_TABLET | Freq: Four times a day (QID) | ORAL | Status: DC | PRN

## 2014-07-12 NOTE — ED Notes (Signed)
[facility] called and truck is at [facility].

## 2014-07-12 NOTE — ED Provider Notes (Signed)
CSN: [id number]     Arrival date & time 07/12/14  0104 History   First MD Initiated Contact with Patient 07/12/14 [phone]     Chief Complaint  Patient presents with  . Nausea  . Emesis  . Hypertension  . Hyperglycemia     (Consider location/radiation/quality/duration/timing/severity/associated sxs/prior Treatment) Patient is a 63 y.o. female presenting with vomiting. The history is provided by the patient.  Emesis Severity:  Moderate Timing:  Intermittent Quality:  Stomach contents Progression:  Unchanged Chronicity:  New Recent urination:  Increased Context: not post-tussive   Relieved by:  Nothing Worsened by:  Nothing tried Ineffective treatments:  None tried Associated symptoms: no abdominal pain, no diarrhea and no fever   Risk factors: no alcohol use     Past Medical History  Diagnosis Date  . Hyperthyroidism, subclinical   . Post menopausal syndrome   . Obesity   . Cardiac defibrillator in situ     Atlas II VR (SJM) implanted by Dr Lovena Le  . Eczema   . Lipoma   . Chronic ulcer of leg   . Hyperlipidemia   . Chronic combined systolic and diastolic heart failure     a. EF 35-40% in past;  b. Echo 7/13:  EF 45-50%, Gr 2 diast dysfn, mild AI, mild MAC, trivial MR, mild LAE, PASP 47.  [name] NICM (nonischemic cardiomyopathy)   . Diabetes mellitus   . HTN (hypertension)   . Elevated alkaline phosphatase level     GGT and 5'nucleotidase 8/13 normal  . Hx of cardiac cath     a. [place] 2003 normal;  b. LHC 6/13:  Mild calcification in the LM, o/w normal coronary arteries, EF 45%.   . Sleep apnea     pt denies 04/12/2013  . COPD (chronic obstructive pulmonary disease)     O2 at night  . Automatic implantable cardioverter-defibrillator in situ   . CHF (congestive heart failure)   . Depression   . Implantable cardioverter-defibrillator (ICD) generator end of life    Past Surgical History  Procedure Laterality Date  . Cardiac defibrillator placement  05/04/2007    SJM Atlas  II VR ICD  . Hysteroscopy    . Cardiac defibrillator placement    . Abdominal hysterectomy    . Cardiac catheterization    . Insert / replace / remove pacemaker    . Tubal ligation    . Hernia repair    . Colonoscopy N/A 04/12/2013    Procedure: COLONOSCOPY;  Surgeon: Beryle Beams, MD;  Location: [facility];  Service: Endoscopy;  Laterality: N/A;  pt.has defibrilator   Family History  Problem Relation Age of Onset  . Stroke Mother   . Seizures Father   . Diabetes Sister   . Asthma Maternal Aunt     aunts  . Asthma Maternal Uncle     uncles  . Heart disease Father   . Heart disease Paternal Aunt     aunts  . Heart disease Paternal Uncle     uncles  . Heart disease Maternal Aunt     aunts  . Heart disease Maternal Uncle     uncles  . Heart disease Maternal Grandfather    History  Substance Use Topics  . Smoking status: Light Tobacco Smoker -- 0.25 packs/day for 35 years    Types: Cigarettes  . Smokeless tobacco: Not on file     Comment: currently smoking 3-4 cigs daily or less.  . Alcohol Use: 0.6 oz/week  1 Glasses of wine per week   OB History   Grav Para Term Preterm Abortions TAB SAB Ect Mult Living                 Review of Systems  Gastrointestinal: Positive for vomiting. Negative for abdominal pain and diarrhea.  All other systems reviewed and are negative.     Allergies  Avandia; Hydrocortisone; and Pioglitazone  Home Medications   Prior to Admission medications   Medication Sig Start Date End Date Taking? Authorizing Provider  albuterol (VENTOLIN HFA) 108 (90 BASE) MCG/ACT inhaler Inhale 2 puffs into the lungs every 6 (six) hours as needed. Shortness of breath 07/09/13  Yes Wilber Oliphant, MD  amLODipine (NORVASC) 10 MG tablet Take 10 mg by mouth daily. 04/08/14 06/01/15 Yes Wilber Oliphant, MD  aspirin EC 81 MG tablet Take 81 mg by mouth daily.   Yes Historical Provider, MD  atorvastatin (LIPITOR) 80 MG tablet Take 1 tablet (80 mg total) by  mouth at bedtime. 01/30/13  Yes Otho Bellows, MD  citalopram (CELEXA) 20 MG tablet Take 1 tablet (20 mg total) by mouth daily. 01/30/13  Yes Otho Bellows, MD  furosemide (LASIX) 40 MG tablet Take 1 tablet (40 mg total) by mouth 2 (two) times daily. 07/04/14  Yes Nischal Dareen Piano, MD  gabapentin (NEURONTIN) 300 MG capsule Take 1 capsule (300 mg total) by mouth at bedtime. 07/04/14  Yes Nischal Dareen Piano, MD  insulin glargine (LANTUS) 100 UNIT/ML injection Inject 44 Units into the skin daily with breakfast. 04/08/13  Yes Wilber Oliphant, MD  insulin lispro (HUMALOG) 100 UNIT/ML injection Inject 8 units with breakfast and 12 units with lunch and 10 units with dinner 10/08/13  Yes Wilber Oliphant, MD  lisinopril (PRINIVIL,ZESTRIL) 40 MG tablet Take 40 mg by mouth daily. 04/08/14  Yes Wilber Oliphant, MD  metFORMIN (GLUCOPHAGE) 500 MG tablet Take 1 tablet (500 mg total) by mouth daily with breakfast. 05/22/14 05/22/15 Yes Marjan Rabbani, MD  metoprolol (TOPROL-XL) 200 MG 24 hr tablet Take 1 tablet (200 mg total) by mouth daily. 05/22/14  Yes Juluis Mire, MD  nitroGLYCERIN (NITROSTAT) 0.4 MG SL tablet Place 0.4 mg under the tongue every 5 (five) minutes as needed for chest pain.   Yes Historical Provider, MD  spironolactone (ALDACTONE) 25 MG tablet Take 1 tablet (25 mg total) by mouth daily. 07/04/14  Yes Nischal Dareen Piano, MD  glucose blood (ONE TOUCH ULTRA TEST) test strip Use as instructed by doctor up to 3x daily,dx code 250.02 insulin requiring 05/20/13   Wilber Oliphant, MD   BP 173/73  Pulse 94  Temp(Src) 98.8 F (37.1 C) (Oral)  Resp 25  SpO2 99% Physical Exam  Constitutional: She is oriented to person, place, and time. She appears well-developed and well-nourished. No distress.  HENT:  Head: Normocephalic and atraumatic.  Mouth/Throat: Oropharynx is clear and moist. No oropharyngeal exudate.  Eyes: Conjunctivae and EOM are normal. Pupils are equal, round, and reactive to light.  Neck: Normal  range of motion. Neck supple.  Cardiovascular: Normal rate, regular rhythm and intact distal pulses.   Pulmonary/Chest: Effort normal and breath sounds normal. No stridor. No respiratory distress. She has no wheezes. She has no rales.  Abdominal: Soft. Bowel sounds are normal. There is no tenderness. There is no rebound and no guarding.  Musculoskeletal: Normal range of motion. She exhibits no edema.  Neurological: She is alert and oriented to person, place, and time.  Skin: Skin  is warm and dry.  Psychiatric: She has a normal mood and affect.    ED Course  Procedures (including critical care time) Labs Review Labs Reviewed  CBC WITH DIFFERENTIAL - Abnormal; Notable for the following:    RBC 5.18 (*)    All other components within normal limits  URINALYSIS, ROUTINE W REFLEX MICROSCOPIC - Abnormal; Notable for the following:    APPearance CLOUDY (*)    Glucose, UA >1000 (*)    Ketones, ur 40 (*)    All other components within normal limits  COMPREHENSIVE METABOLIC PANEL - Abnormal; Notable for the following:    Potassium 5.5 (*)    Glucose, Bld 339 (*)    Anion gap 19 (*)    All other components within normal limits  LIPASE, BLOOD  TROPONIN I  URINE MICROSCOPIC-ADD ON  POTASSIUM    Imaging Review No results found.   EKG Interpretation None      MDM   Final diagnoses:  None     Date: 07/12/2014  Rate: 94  Rhythm: normal sinus rhythm  QRS Axis: normal  Intervals: normal  ST/T Wave abnormalities: normal  Conduction Disutrbances: none  Narrative Interpretation: unremarkable    Medications  insulin regular (NOVOLIN R,HUMULIN R) 250 Units in sodium chloride 0.9 % 250 mL (1 Units/mL) infusion (5.2 Units/hr Intravenous New Bag/Given 07/12/14 0716)  ondansetron (ZOFRAN) injection 4 mg (4 mg Intravenous Given 07/12/14 0145)  sodium chloride 0.9 % bolus 1,000 mL (0 mLs Intravenous Stopped 07/12/14 0438)  furosemide (LASIX) injection 20 mg (20 mg Intravenous Given  07/12/14 [id number])     Case d/w Internal medicine resident, admit to step down under Dr. Beryle Beams at [place], MD 07/12/14 [phone]

## 2014-07-12 NOTE — H&P (Signed)
Date: 07/12/2014               Patient Name:  [name] MRN: [id number]  DOB: [birth year] Age / Sex: 63 y.o., female   PCP: Wilber Oliphant, MD         Medical Service: Internal Medicine Teaching Service         Attending Physician: Dr. Annia Belt, MD    First Contact: Dr. Genene Churn Pager: [phone]  Second Contact: Dr. Heber [place] Pager: [phone]       After Hours (After 5p/  First Contact Pager: [phone]  weekends / holidays): Second Contact Pager: [phone]   Chief Complaint: nausea/vomitting followed by chest pressure  History of Present Illness:   63 yo female with combined CHF EF 35-40% on AICD, NICM, HTN, HLD, DM2 here with nausea and vomitting since yesterday morning. It started out with whatever she ate then became bilious in color. Nonbloody. Denies unusual food. Denies sick contacts. After several emesis, she is having some soreness in her chest, mild in nature. Denies any sob, palpitations, orthopnea. Has some mild pedal edema. Takes lasix at home. Hasn't taken any of her meds for last 3 days.   Denies fever/chills, diarrhea. Had been losing weight which she states could be from metformin. She was 245lb on 04/2013, now she is 218. Feels somewhat better currently than she felt before.  Smokes 4 cigarettes a day, accessional alcohol use, no recreational drugs.   Family hx significant for aunt with colon cancer. She had her last cscope 2 years ago, had 1 polyp, was told to f/up after 5 years from prior cscope. Follows up with Dr. Arlan Organ. Stanford Breed cards.   Meds: Current Facility-Administered Medications  Medication Dose Route Frequency Provider Last Rate Last Dose  . [START ON 07/13/2014] Influenza vac split quadrivalent PF (FLUARIX) injection 0.5 mL  0.5 mL Intramuscular Tomorrow-1000 Annia Belt, MD      . Derrill Memo ON 07/13/2014] insulin aspart (novoLOG) injection 0-15 Units  0-15 Units Subcutaneous TID WC Lucious Groves, DO      . insulin aspart (novoLOG)  injection 0-5 Units  0-5 Units Subcutaneous QHS Lucious Groves, DO      . [START ON 07/13/2014] insulin aspart (novoLOG) injection 6 Units  6 Units Subcutaneous TID WC Lucious Groves, DO      . Derrill Memo ON 07/13/2014] insulin glargine (LANTUS) injection 30 Units  30 Units Subcutaneous Daily Lucious Groves, DO      . [START ON 07/13/2014] pneumococcal 23 valent vaccine (PNU-IMMUNE) injection 0.5 mL  0.5 mL Intramuscular Tomorrow-1000 Annia Belt, MD        Allergies: Allergies as of 07/12/2014 - Review Complete 07/12/2014  Allergen Reaction Noted  . Avandia [rosiglitazone maleate]  04/13/2011  . Hydrocortisone  10/06/2009  . Pioglitazone  03/12/2009   Past Medical History  Diagnosis Date  . Hyperthyroidism, subclinical   . Post menopausal syndrome   . Obesity   . Cardiac defibrillator in situ     Atlas II VR (SJM) implanted by Dr Lovena Le  . Eczema   . Lipoma   . Chronic ulcer of leg   . Hyperlipidemia   . Chronic combined systolic and diastolic heart failure     a. EF 35-40% in past;  b. Echo 7/13:  EF 45-50%, Gr 2 diast dysfn, mild AI, mild MAC, trivial MR, mild LAE, PASP 47.  [name] NICM (nonischemic cardiomyopathy)   . Diabetes mellitus   . HTN (hypertension)   .  Elevated alkaline phosphatase level     GGT and 5'nucleotidase 8/13 normal  . Hx of cardiac cath     a. [place] 2003 normal;  b. LHC 6/13:  Mild calcification in the LM, o/w normal coronary arteries, EF 45%.   . Sleep apnea     pt denies 04/12/2013  . COPD (chronic obstructive pulmonary disease)     O2 at night  . Automatic implantable cardioverter-defibrillator in situ   . CHF (congestive heart failure)   . Depression   . Implantable cardioverter-defibrillator (ICD) generator end of life    Past Surgical History  Procedure Laterality Date  . Cardiac defibrillator placement  05/04/2007    SJM Atlas II VR ICD  . Hysteroscopy    . Cardiac defibrillator placement    . Abdominal hysterectomy    . Cardiac  catheterization    . Insert / replace / remove pacemaker    . Tubal ligation    . Hernia repair    . Colonoscopy N/A 04/12/2013    Procedure: COLONOSCOPY;  Surgeon: Beryle Beams, MD;  Location: [facility];  Service: Endoscopy;  Laterality: N/A;  pt.has defibrilator   Family History  Problem Relation Age of Onset  . Stroke Mother   . Seizures Father   . Diabetes Sister   . Asthma Maternal Aunt     aunts  . Asthma Maternal Uncle     uncles  . Heart disease Father   . Heart disease Paternal Aunt     aunts  . Heart disease Paternal Uncle     uncles  . Heart disease Maternal Aunt     aunts  . Heart disease Maternal Uncle     uncles  . Heart disease Maternal Grandfather    History   Social History  . Marital Status: Widowed    Spouse Name: Alroy Dust    Number of Children: N/A  . Years of Education: N/A   Occupational History  . disabled    Social History Main Topics  . Smoking status: Light Tobacco Smoker -- 0.25 packs/day for 35 years    Types: Cigarettes  . Smokeless tobacco: Not on file     Comment: currently smoking 3-4 cigs daily or less.  . Alcohol Use: 0.6 oz/week    1 Glasses of wine per week  . Drug Use: No  . Sexual Activity: Not on file   Other Topics Concern  . Not on file   Social History Narrative   ** Merged History Encounter **       Married    Review of Systems: Review of Systems  Constitutional: Negative for fever, chills, weight loss, malaise/fatigue and diaphoresis.  HENT: Positive for sore throat. Negative for ear discharge, ear pain, hearing loss, nosebleeds and tinnitus.   Eyes: Negative.   Respiratory: Negative for cough, hemoptysis, sputum production, shortness of breath and wheezing.   Cardiovascular: Positive for chest pain. Negative for palpitations, orthopnea, claudication, leg swelling and PND.  Gastrointestinal: Positive for nausea, vomiting and abdominal pain. Negative for heartburn, diarrhea, constipation, blood in stool and  melena.  Genitourinary: Negative.   Musculoskeletal: Negative.   Skin: Negative.   Neurological: Negative.  Negative for weakness and headaches.  Endo/Heme/Allergies: Negative.   Psychiatric/Behavioral: Negative.      Physical Exam: Blood pressure 186/80, pulse 102, temperature 98.7 F (37.1 C), temperature source Oral, resp. rate 19, height 5' (1.524 m), weight 99.1 kg (218 lb 7.6 oz), SpO2 100.00%. Physical Exam  Constitutional: She is oriented  to person, place, and time. She appears well-developed and well-nourished. No distress.  HENT:  Head: Normocephalic and atraumatic.  Right Ear: External ear normal.  Left Ear: External ear normal.  Nose: Nose normal.  Mouth/Throat: Oropharynx is clear and moist. No oropharyngeal exudate.  Eyes: Conjunctivae and EOM are normal. Pupils are equal, round, and reactive to light. Right eye exhibits no discharge. Left eye exhibits no discharge. No scleral icterus.  Neck: Normal range of motion. Neck supple. No JVD present. No tracheal deviation present. No thyromegaly present.  Cardiovascular: Normal rate, regular rhythm and intact distal pulses.  Exam reveals no gallop and no friction rub.   No murmur heard. Respiratory: Effort normal and breath sounds normal. No stridor. No respiratory distress. She has no wheezes. She has no rales. She exhibits no tenderness.  GI: Soft. Bowel sounds are normal. She exhibits no distension and no mass. There is no tenderness. There is no rebound and no guarding.  Musculoskeletal: Normal range of motion. She exhibits no edema and no tenderness.  Lymphadenopathy:    She has no cervical adenopathy.  Neurological: She is alert and oriented to person, place, and time. She has normal reflexes. She displays normal reflexes. No cranial nerve deficit. She exhibits normal muscle tone. Coordination normal.  Skin: Skin is warm. She is not diaphoretic.  Psychiatric: She has a normal mood and affect.    Lab results: Basic  Metabolic Panel:  Recent Labs  07/12/14 0140 07/12/14 0549 07/12/14 1040  NA 137  --  136*  K 5.5* 4.6 4.0  CL 97  --  98  CO2 21  --  23  GLUCOSE 339*  --  208*  BUN 11  --  9  CREATININE 0.68  --  0.76  CALCIUM 9.2  --  9.5   Liver Function Tests:  Recent Labs  07/12/14 0140  AST 32  ALT 17  ALKPHOS 117  BILITOT 0.7  PROT 7.5  ALBUMIN 3.8    Recent Labs  07/12/14 0140  LIPASE 35   No results found for this basename: AMMONIA,  in the last 72 hours CBC:  Recent Labs  07/12/14 0140  WBC 8.6  NEUTROABS 6.7  HGB 14.7  HCT 44.2  MCV 85.3  PLT 183   Cardiac Enzymes:  Recent Labs  07/12/14 0140  TROPONINI <0.30   BNP:  Recent Labs  07/12/14 0526  PROBNP 718.4*   D-Dimer: No results found for this basename: DDIMER,  in the last 72 hours CBG:  Recent Labs  07/12/14 0540 07/12/14 0704 07/12/14 0816 07/12/14 0946 07/12/14 1058  GLUCAP 327* 320* 292* 237* 177*   Hemoglobin A1C: No results found for this basename: HGBA1C,  in the last 72 hours Fasting Lipid Panel: No results found for this basename: CHOL, HDL, LDLCALC, TRIG, CHOLHDL, LDLDIRECT,  in the last 72 hours Thyroid Function Tests: No results found for this basename: TSH, T4TOTAL, FREET4, T3FREE, THYROIDAB,  in the last 72 hours Anemia Panel: No results found for this basename: VITAMINB12, FOLATE, FERRITIN, TIBC, IRON, RETICCTPCT,  in the last 72 hours Coagulation: No results found for this basename: LABPROT, INR,  in the last 72 hours Urine Drug Screen: Drugs of Abuse     Component Value Date/Time   LABOPIA NONE DETECTED 05/15/2014 [place] 05/15/2014 1141   LABBENZ NONE DETECTED 05/15/2014 1141   AMPHETMU NONE DETECTED 05/15/2014 1141   THCU NONE DETECTED 05/15/2014 1141   LABBARB NONE DETECTED 05/15/2014 1141  Alcohol Level: No results found for this basename: ETH,  in the last 72 hours Urinalysis:  Recent Labs  07/12/14 0301  COLORURINE YELLOW    LABSPEC 1.021  PHURINE 6.0  GLUCOSEU >1000*  HGBUR NEGATIVE  BILIRUBINUR NEGATIVE  KETONESUR 40*  PROTEINUR NEGATIVE  UROBILINOGEN 1.0  NITRITE NEGATIVE  LEUKOCYTESUR NEGATIVE   Misc. Labs:  Imaging results:  Dg Abd Acute W/chest  07/12/2014   CLINICAL DATA:  Initial evaluation for vomiting  EXAM: ACUTE ABDOMEN SERIES (ABDOMEN 2 VIEW & CHEST 1 VIEW)  COMPARISON:  Prior study from 08/31/2012  FINDINGS: Single lead left-sided transvenous pacemaker/AICD is unchanged. Cardiomegaly is stable. Mediastinal silhouette remains within normal limits.  There is subtle patchy infiltrate within the right upper lobe, which may reflect mild asymmetric edema or possibly infiltrate. Lungs are otherwise clear. No pneumothorax.  No free air seen on lateral tube in is dissection. Visualized bowel gas pattern is nonobstructive. No abnormal bowel wall thickening. No soft tissue mass or abnormal calcification.  No acute osseus abnormality. Degenerative changes noted within the lower lumbar spine about the bilateral hips.  IMPRESSION: 1. Nonobstructive bowel gas pattern with no radiographic evidence of acute intra-abdominal abnormality. 2. Mild asymmetric opacity within the right upper lobe. Finding is nonspecific, and may reflect asymmetric edema versus infiltrate. Asymmetric pulmonary edema involving the right upper lobe can be seen with underlying mitral valve disease.   Electronically Signed   By: Jeannine Boga M.D.   On: 07/12/2014 04:54    Other results: EKG: NSR, HR 94, QTc 494, RA enlargement, TWI in v5, v5, v6, avL. Unchanged from prior.  Assessment & Plan by Problem: Active Problems:   Type 2 diabetes mellitus not at goal   CHF (congestive heart failure)   Nausea & vomiting  63 yo female with DM II, HTN, combined CHF comes in with n/v since yesterday morning and subsequently having some chest soreness.  Nausea/vomitting - unclear etiology, could be viral gastroenteritis.  - treat  supportively with zofran PRN - already got 1L bolus in ED but then also got lasix at ED. Currently appears mostly euvolemic with some mild edema. - will not give more fluid given her CHF - advance diet as tolerated  Chest soreness - started after vomiting, unlikely cardiac. Could be just muscular soreness. [place] is unlikely given not excruciating pain. Patient is not in any resp distress, No EKG changes. Trop x1 - will get another trop x1. Very low suspicion for MI but has high risk factor given HTN, DM.  - observe for now.  Combined CHF - last echo 03/2014 EF 35-40% with grade 1 dysfunction, mild MR, AR, LA enlargement.  -appears to be euvolemic currenlty, no SOB.  -has AICD. Continue home lasix 40mg  BID po and spironolatone 25mg  daily. Also cont amlodopine and metoprolol - cont asa 81mg    HTN - BP remains elevated here - likely 2/2 to not taking any meds for last 3 days - will restart home meds amlodopine 10mg , lasix 40mg  bid po, lisinopril 40 po daily, metoprolol XL 200mg  daily, and spirolactone 25 oral daily.  Weight loss - concerning for malignancy  - last cscope 2 years ago benign other than 1 polyp. Weight loss could be 2/2 to metformin as patient mentioned - will do FOBT.  DM II --uncontrolled. Last hgba1c 10.9. Takes metformin, lantus 44 am, and 8, 12, 10 meal time insulins. CBG on presention 327. Now down to 177. - no anion gap. - will do 30 lantus, 6  meal times, and SSI moderate -repeat hgba1c.  HLD continue atorvastatin 80 mg daily  COPD - cont albuterol nebs PRN  dvt ppx: heparin.   Dispo: Disposition is deferred at this time, awaiting improvement of current medical problems. Anticipated discharge in approximately 2-3 day(s).   The patient does have a current PCP Wilber Oliphant, MD) and does need an [facility] hospital follow-up appointment after discharge.  The patient does have transportation limitations that hinder transportation to clinic  appointments.  Signed: Dellia Nims, MD 07/12/2014, 5:26 PM

## 2014-07-12 NOTE — ED Notes (Signed)
CBG 237 resulted at 0946.  Unsure why crossover did not happen until 1059.

## 2014-07-12 NOTE — ED Notes (Signed)
Bed: [id number] Expected date:  Expected time:  Means of arrival:  Comments: Bed 12, EMS, 62 F, N/V

## 2014-07-12 NOTE — ED Notes (Signed)
Lab at bedside

## 2014-07-12 NOTE — ED Notes (Signed)
EKG given to EDP,Palumbo,MD. For review. 

## 2014-07-12 NOTE — ED Notes (Signed)
Attempted lab draw but unsuccessful. RN, Lilibeth made aware. 

## 2014-07-12 NOTE — ED Notes (Signed)
Per EMS pt. From home with complaint of nausea and vomiting which started at 08am yesterday , denies abdominal pain nor diarrhea. Pt. Has cbg of 279mg /dl.[name]

## 2014-07-12 NOTE — ED Notes (Signed)
MD at bedside. 

## 2014-07-12 NOTE — ED Notes (Signed)
I tried and [name] tried to get blood from patient but was unsuccessful.

## 2014-07-12 NOTE — ED Provider Notes (Signed)
11:13 AM Pt is feeling much better. Abdomen is benign. Blood sugar coming down. Co2>20 on arrival.  Not convinced this is diabetic ketoacidosis.  I think this was isolated hyperglycemia and mild hypochloremia.  No diarrhea.  No indication for abdominal imaging.  If the patient can keep food and fluids down I think she is a good candidate for discharge home with antinausea medicine.  I will reevaluate.  1:36 PM Patient is now off her insulin drip.  She however continues to have nausea and vomited one time in the emergency department.  Patient will continue her admission to the hospital.  I do not think the patient stepped on at this time her been requested we change to telemetry    Results for orders placed during the hospital encounter of 07/12/14  CBC WITH DIFFERENTIAL      Result Value Ref Range   WBC 8.6  4.0 - 10.5 K/uL   RBC 5.18 (*) 3.87 - 5.11 MIL/uL   Hemoglobin 14.7  12.0 - 15.0 g/dL   HCT 44.2  36.0 - 46.0 %   MCV 85.3  78.0 - 100.0 fL   MCH 28.4  26.0 - 34.0 pg   MCHC 33.3  30.0 - 36.0 g/dL   RDW 15.4  11.5 - 15.5 %   Platelets 183  150 - 400 K/uL   Neutrophils Relative % 77  43 - 77 %   Neutro Abs 6.7  1.7 - 7.7 K/uL   Lymphocytes Relative 18  12 - 46 %   Lymphs Abs 1.5  0.7 - 4.0 K/uL   Monocytes Relative 4  3 - 12 %   Monocytes Absolute 0.3  0.1 - 1.0 K/uL   Eosinophils Relative 1  0 - 5 %   Eosinophils Absolute 0.1  0.0 - 0.7 K/uL   Basophils Relative 0  0 - 1 %   Basophils Absolute 0.0  0.0 - 0.1 K/uL  LIPASE, BLOOD      Result Value Ref Range   Lipase 35  11 - 59 U/L  URINALYSIS, ROUTINE W REFLEX MICROSCOPIC      Result Value Ref Range   Color, Urine YELLOW  YELLOW   APPearance CLOUDY (*) CLEAR   Specific Gravity, Urine 1.021  1.005 - 1.030   pH 6.0  5.0 - 8.0   Glucose, UA >1000 (*) NEGATIVE mg/dL   Hgb urine dipstick NEGATIVE  NEGATIVE   Bilirubin Urine NEGATIVE  NEGATIVE   Ketones, ur 40 (*) NEGATIVE mg/dL   Protein, ur NEGATIVE  NEGATIVE mg/dL   Urobilinogen, UA 1.0  0.0 - 1.0 mg/dL   Nitrite NEGATIVE  NEGATIVE   Leukocytes, UA NEGATIVE  NEGATIVE  COMPREHENSIVE METABOLIC PANEL      Result Value Ref Range   Sodium 137  137 - 147 mEq/L   Potassium 5.5 (*) 3.7 - 5.3 mEq/L   Chloride 97  96 - 112 mEq/L   CO2 21  19 - 32 mEq/L   Glucose, Bld 339 (*) 70 - 99 mg/dL   BUN 11  6 - 23 mg/dL   Creatinine, Ser 0.68  0.50 - 1.10 mg/dL   Calcium 9.2  8.4 - 10.5 mg/dL   Total Protein 7.5  6.0 - 8.3 g/dL   Albumin 3.8  3.5 - 5.2 g/dL   AST 32  0 - 37 U/L   ALT 17  0 - 35 U/L   Alkaline Phosphatase 117  39 - 117 U/L   Total Bilirubin 0.7  0.3 - 1.2 mg/dL   GFR calc non Af Amer >90  >90 mL/min   GFR calc Af Amer >90  >90 mL/min   Anion gap 19 (*) 5 - 15  TROPONIN I      Result Value Ref Range   Troponin I <0.30  <0.30 ng/mL  URINE MICROSCOPIC-ADD ON      Result Value Ref Range   Squamous Epithelial / LPF RARE  RARE  PRO B NATRIURETIC PEPTIDE      Result Value Ref Range   Pro B Natriuretic peptide (BNP) 718.4 (*) 0 - 125 pg/mL  POTASSIUM      Result Value Ref Range   Potassium 4.6  3.7 - 5.3 mEq/L  BASIC METABOLIC PANEL      Result Value Ref Range   Sodium 136 (*) 137 - 147 mEq/L   Potassium 4.0  3.7 - 5.3 mEq/L   Chloride 98  96 - 112 mEq/L   CO2 23  19 - 32 mEq/L   Glucose, Bld 208 (*) 70 - 99 mg/dL   BUN 9  6 - 23 mg/dL   Creatinine, Ser 0.76  0.50 - 1.10 mg/dL   Calcium 9.5  8.4 - 10.5 mg/dL   GFR calc non Af Amer 90 (*) >90 mL/min   GFR calc Af Amer >90  >90 mL/min   Anion gap 15  5 - 15  CBG MONITORING, ED      Result Value Ref Range   Glucose-Capillary 327 (*) 70 - 99 mg/dL  CBG MONITORING, ED      Result Value Ref Range   Glucose-Capillary 320 (*) 70 - 99 mg/dL  CBG MONITORING, ED      Result Value Ref Range   Glucose-Capillary 292 (*) 70 - 99 mg/dL  CBG MONITORING, ED      Result Value Ref Range   Glucose-Capillary 237 (*) 70 - 99 mg/dL   Comment 1 Notify RN    CBG MONITORING, ED      Result Value Ref Range    Glucose-Capillary 177 (*) 70 - 99 mg/dL      Hoy Morn, MD 07/12/14 1337

## 2014-07-12 NOTE — ED Notes (Signed)
Ronny Bacon of lab made aware that repeat Potassium collected by Nancy Marus hemolyzed soon as she collected said specimen.

## 2014-07-12 NOTE — ED Notes (Signed)
Care [facility] here to pick up pt  

## 2014-07-12 NOTE — ED Notes (Signed)
Per [name] NS, Pt is vomiting.  MD notified.

## 2014-07-12 NOTE — ED Notes (Signed)
Pt reports feeling nauseous and is actively dry heaving.  MD notified.

## 2014-07-13 DIAGNOSIS — E669 Obesity, unspecified: Secondary | ICD-10-CM | POA: Diagnosis present

## 2014-07-13 DIAGNOSIS — I5021 Acute systolic (congestive) heart failure: Secondary | ICD-10-CM | POA: Diagnosis present

## 2014-07-13 DIAGNOSIS — J449 Chronic obstructive pulmonary disease, unspecified: Secondary | ICD-10-CM | POA: Diagnosis present

## 2014-07-13 DIAGNOSIS — N170 Acute kidney failure with tubular necrosis: Secondary | ICD-10-CM | POA: Diagnosis present

## 2014-07-13 DIAGNOSIS — I5042 Chronic combined systolic (congestive) and diastolic (congestive) heart failure: Secondary | ICD-10-CM | POA: Diagnosis present

## 2014-07-13 DIAGNOSIS — R0789 Other chest pain: Secondary | ICD-10-CM

## 2014-07-13 DIAGNOSIS — F17210 Nicotine dependence, cigarettes, uncomplicated: Secondary | ICD-10-CM | POA: Diagnosis present

## 2014-07-13 DIAGNOSIS — Z794 Long term (current) use of insulin: Secondary | ICD-10-CM | POA: Diagnosis not present

## 2014-07-13 DIAGNOSIS — I10 Essential (primary) hypertension: Secondary | ICD-10-CM | POA: Diagnosis present

## 2014-07-13 DIAGNOSIS — K3184 Gastroparesis: Secondary | ICD-10-CM | POA: Diagnosis present

## 2014-07-13 DIAGNOSIS — E785 Hyperlipidemia, unspecified: Secondary | ICD-10-CM | POA: Diagnosis present

## 2014-07-13 DIAGNOSIS — I959 Hypotension, unspecified: Secondary | ICD-10-CM | POA: Diagnosis present

## 2014-07-13 DIAGNOSIS — E1165 Type 2 diabetes mellitus with hyperglycemia: Secondary | ICD-10-CM | POA: Diagnosis present

## 2014-07-13 DIAGNOSIS — Z95810 Presence of automatic (implantable) cardiac defibrillator: Secondary | ICD-10-CM | POA: Diagnosis not present

## 2014-07-13 DIAGNOSIS — E119 Type 2 diabetes mellitus without complications: Secondary | ICD-10-CM

## 2014-07-13 DIAGNOSIS — R339 Retention of urine, unspecified: Secondary | ICD-10-CM | POA: Diagnosis present

## 2014-07-13 DIAGNOSIS — E1143 Type 2 diabetes mellitus with diabetic autonomic (poly)neuropathy: Secondary | ICD-10-CM | POA: Diagnosis present

## 2014-07-13 DIAGNOSIS — E873 Alkalosis: Secondary | ICD-10-CM

## 2014-07-13 DIAGNOSIS — I429 Cardiomyopathy, unspecified: Secondary | ICD-10-CM | POA: Diagnosis present

## 2014-07-13 LAB — HIV ANTIBODY (ROUTINE TESTING W REFLEX): HIV 1&2 Ab, 4th Generation: NONREACTIVE

## 2014-07-13 LAB — BASIC METABOLIC PANEL
Anion gap: 15 (ref 5–15)
BUN: 12 mg/dL (ref 6–23)
CO2: 27 mEq/L (ref 19–32)
Calcium: 9.4 mg/dL (ref 8.4–10.5)
Chloride: 95 mEq/L — ABNORMAL LOW (ref 96–112)
Creatinine, Ser: 0.96 mg/dL (ref 0.50–1.10)
GFR calc Af Amer: 73 mL/min — ABNORMAL LOW (ref >90)
GFR calc non Af Amer: 63 mL/min — ABNORMAL LOW (ref >90)
Glucose, Bld: 230 mg/dL — ABNORMAL HIGH (ref 70–99)
Potassium: 4.6 mEq/L (ref 3.7–5.3)
Sodium: 137 mEq/L (ref 137–147)

## 2014-07-13 LAB — HEMOGLOBIN A1C
Hgb A1c MFr Bld: 12.7 % — ABNORMAL HIGH (ref <5.7)
Mean Plasma Glucose: 318 mg/dL — ABNORMAL HIGH (ref <117)

## 2014-07-13 LAB — GLUCOSE, CAPILLARY
Glucose-Capillary: 226 mg/dL — ABNORMAL HIGH (ref 70–99)
Glucose-Capillary: 199 mg/dL — ABNORMAL HIGH (ref 70–99)
Glucose-Capillary: 158 mg/dL — ABNORMAL HIGH (ref 70–99)
Glucose-Capillary: 124 mg/dL — ABNORMAL HIGH (ref 70–99)

## 2014-07-13 MED ORDER — METOCLOPRAMIDE HCL 5 MG PO TABS
5.0000 mg | ORAL_TABLET | Freq: Three times a day (TID) | ORAL | Status: DC
  Administered 2014-07-13 – 2014-07-18 (×15): 5 mg via ORAL
  Filled 2014-07-13 (×17): qty 1

## 2014-07-13 MED ORDER — PANTOPRAZOLE SODIUM 40 MG PO TBEC
40.0000 mg | DELAYED_RELEASE_TABLET | Freq: Every day | ORAL | Status: DC
  Administered 2014-07-13 – 2014-07-18 (×6): 40 mg via ORAL
  Filled 2014-07-13 (×7): qty 1

## 2014-07-13 NOTE — H&P (Signed)
Medicine attending admission note: I personally interviewed and examined this patient and I concur with the evaluation and management plan as recorded by resident physician Dr. Dellia Nims.  Clinical history: 63 year old woman with hypertension, combined diastolic and systolic heart failure, most recent ventricular ejection fraction 35-40%. Status post AICD implant, hyperlipidemia. She has type 2 diabetes on both insulin and Glucophage. She is obese but reports an approximate25 pound weight loss over the last year. Over the same interval she has had episodes of intermittent nausea and vomiting. She presents with a recurrent episode of nausea and vomiting occurring over the last 24 hours. She has no signs or symptoms of infection. No dietary changes. Initial exam: She was afebrile. Blood pressure 186/80, pulse 102 regular, respirations 19, oxygen saturation 100% on room air. Cardiac and pulmonary exams are normal. Abdomen was soft and nontender with normal bowel sounds, no mass, no organomegaly. Pertinent lab: Initial potassium 5.5. Hemolyzed specimen. Repeat 4.6. Glucose 339, bicarbonate 21, anion gap 19, normal liver functions, normal lipase, BUN 11, creatinine 0.7 Hemoglobin 14.7, white count 8600 Urine analysis negative for nitrite negative for white cells Pro BNP 718 EKG: Which I personally reviewed Normal sinus rhythm, T-wave inversions in V5, V6, and aVL, unchanged from prior tracings.  Abdominal x-ray which I personally reviewed showed a nonobstructive bowel gas pattern Chest radiograph which I personally reviewed shows prominent markings right upper lung unchanged from a previous film  Additional review of systems: She denies any dysphagia  Current exam: Blood pressure 144/71, pulse 82, temperature 97.8 F (36.6 C), temperature source Oral, resp. rate 17, height 5' (1.524 m), weight 218 lb 7.6 oz (99.1 kg), SpO2 100.00%. No change from above recorded exam except for mild epigastric  tenderness on palpation.  Impression Recurrent bouts of nausea and vomiting in a long-standing type II but now insulin-dependent diabetic. Significant 25 pound weight loss but no anemia. Negative colonoscopy 2 years ago except for benign polyps.  My first concern is that this is diabetic gastroparesis. However, given the weight loss, other GI pathology must be considered including ulceration or tumor. We will proceed with an upper GI series and barium swallow. Further testing based on results. Trial of Reglan if upper GI otherwise normal.  Murriel Hopper, MD, FACP  Hematology-Oncology/Internal Medicine

## 2014-07-13 NOTE — Progress Notes (Signed)
Subjective: Still has nausea and threw up her breakfast this morning. States n/v has been on going for her, happens every now and then and gets better. Denies any acid reflux hx or symptom of heart burn. Denies cp/sob/diarrhea.   Objective: Vital signs in last 24 hours: Filed Vitals:   07/12/14 1530 07/12/14 1652 07/12/14 2002 07/13/14 0403  BP: 193/78 186/80 149/89 146/75  Pulse: 99 102 100 57  Temp:  98.7 F (37.1 C) 99.2 F (37.3 C) 98.9 F (37.2 C)  TempSrc:  Oral Oral Oral  Resp: 24 19 18 18   Height:  5' (1.524 m)    Weight:  99.1 kg (218 lb 7.6 oz)    SpO2: 95% 100% 97% 95%   Weight change:  No intake or output data in the 24 hours ending 07/13/14 1212 Vitals reviewed. General: resting in bed, appears to be tired from vomitting HEENT: PERRL, EOMI, no scleral icterus Cardiac: RRR, no rubs, murmurs or gallops Pulm: clear to auscultation bilaterally, no wheezes, rales, or rhonchi Abd: soft, some TTP on epigastric region, nondistended, BS present Ext: warm and well perfused, no pedal edema Neuro: alert and oriented X3, cranial nerves II-XII grossly intact, strength and sensation to light touch equal in bilateral upper and lower extremities  Lab Results: Basic Metabolic Panel:  Recent Labs Lab 07/12/14 1040 07/13/14 0427  NA 136* 137  K 4.0 4.6  CL 98 95*  CO2 23 27  GLUCOSE 208* 230*  BUN 9 12  CREATININE 0.76 0.96  CALCIUM 9.5 9.4   Liver Function Tests:  Recent Labs Lab 07/12/14 0140  AST 32  ALT 17  ALKPHOS 117  BILITOT 0.7  PROT 7.5  ALBUMIN 3.8    Recent Labs Lab 07/12/14 0140  LIPASE 35   No results found for this basename: AMMONIA,  in the last 168 hours CBC:  Recent Labs Lab 07/12/14 0140  WBC 8.6  NEUTROABS 6.7  HGB 14.7  HCT 44.2  MCV 85.3  PLT 183   Cardiac Enzymes:  Recent Labs Lab 07/12/14 0140 07/12/14 1855  TROPONINI <0.30 <0.30   BNP:  Recent Labs Lab 07/12/14 0526  PROBNP 718.4*   D-Dimer: No results  found for this basename: DDIMER,  in the last 168 hours CBG:  Recent Labs Lab 07/12/14 0816 07/12/14 0946 07/12/14 1058 07/12/14 1816 07/13/14 0714 07/13/14 1108  GLUCAP 292* 237* 177* 293* 226* 199*   Hemoglobin A1C:  Recent Labs Lab 07/12/14 1855  HGBA1C 12.7*   Fasting Lipid Panel: No results found for this basename: CHOL, HDL, LDLCALC, TRIG, CHOLHDL, LDLDIRECT,  in the last 168 hours Thyroid Function Tests: No results found for this basename: TSH, T4TOTAL, FREET4, T3FREE, THYROIDAB,  in the last 168 hours Coagulation: No results found for this basename: LABPROT, INR,  in the last 168 hours Anemia Panel: No results found for this basename: VITAMINB12, FOLATE, FERRITIN, TIBC, IRON, RETICCTPCT,  in the last 168 hours Urine Drug Screen: Drugs of Abuse     Component Value Date/Time   LABOPIA NONE DETECTED 05/15/2014 1141   COCAINSCRNUR NONE DETECTED 05/15/2014 1141   LABBENZ NONE DETECTED 05/15/2014 1141   AMPHETMU NONE DETECTED 05/15/2014 1141   THCU NONE DETECTED 05/15/2014 1141   LABBARB NONE DETECTED 05/15/2014 1141    Alcohol Level: No results found for this basename: ETH,  in the last 168 hours Urinalysis:  Recent Labs Lab 07/12/14 0301  COLORURINE YELLOW  LABSPEC 1.021  PHURINE 6.0  GLUCOSEU >1000*  HGBUR NEGATIVE  BILIRUBINUR NEGATIVE  KETONESUR 40*  PROTEINUR NEGATIVE  UROBILINOGEN 1.0  NITRITE NEGATIVE  LEUKOCYTESUR NEGATIVE   Misc. Labs:   Micro Results: No results found for this or any previous visit (from the past 240 hour(s)). Studies/Results: Dg Abd Acute W/chest  07/12/2014   CLINICAL DATA:  Initial evaluation for vomiting  EXAM: ACUTE ABDOMEN SERIES (ABDOMEN 2 VIEW & CHEST 1 VIEW)  COMPARISON:  Prior study from 08/31/2012  FINDINGS: Single lead left-sided transvenous pacemaker/AICD is unchanged. Cardiomegaly is stable. Mediastinal silhouette remains within normal limits.  There is subtle patchy infiltrate within the right upper lobe, which  may reflect mild asymmetric edema or possibly infiltrate. Lungs are otherwise clear. No pneumothorax.  No free air seen on lateral tube in is dissection. Visualized bowel gas pattern is nonobstructive. No abnormal bowel wall thickening. No soft tissue mass or abnormal calcification.  No acute osseus abnormality. Degenerative changes noted within the lower lumbar spine about the bilateral hips.  IMPRESSION: 1. Nonobstructive bowel gas pattern with no radiographic evidence of acute intra-abdominal abnormality. 2. Mild asymmetric opacity within the right upper lobe. Finding is nonspecific, and may reflect asymmetric edema versus infiltrate. Asymmetric pulmonary edema involving the right upper lobe can be seen with underlying mitral valve disease.   Electronically Signed   By: Jeannine Boga M.D.   On: 07/12/2014 04:54   Medications: I have reviewed the patient's current medications. Scheduled Meds: . amLODipine  10 mg Oral Daily  . aspirin EC  81 mg Oral Daily  . atorvastatin  80 mg Oral QHS  . citalopram  20 mg Oral Daily  . enoxaparin (LOVENOX) injection  40 mg Subcutaneous Q24H  . furosemide  40 mg Oral BID  . gabapentin  300 mg Oral QHS  . insulin aspart  0-15 Units Subcutaneous TID WC  . insulin aspart  0-7 Units Subcutaneous QHS  . insulin aspart  6 Units Subcutaneous TID WC  . insulin glargine  30 Units Subcutaneous Daily  . lisinopril  40 mg Oral Daily  . metoprolol  200 mg Oral Daily  . spironolactone  25 mg Oral Daily   Continuous Infusions:  PRN Meds:.acetaminophen, acetaminophen, albuterol, nitroGLYCERIN, ondansetron (ZOFRAN) IV, ondansetron Assessment/Plan: Principal Problem:   Nausea and vomiting Active Problems:   Chronic combined systolic and diastolic heart failure   COPD, moderate   Automatic implantable cardioverter-defibrillator in situ   Type 2 diabetes mellitus not at goal  63 yo female with DM II, HTN, combined CHF comes in with n/v and subsequently having some  chest soreness. Chest soreness resolved.  Nausea/vomitting - unclear etiology but this has been going on for a long time. Recurs several times a year. This could be 2/2 diabetic gastroparesis given her uncontrolled diabetes. Other ddx includes gastroenteritis, GERD, esophageal stricture, ulcers.  - will get Upper GI study to evaluate her - advance diet as tolerated.  Combined CHF - last echo 03/2014 EF 35-40% with grade 1 dysfunction, mild MR, AR, LA enlargement.  -appears to be euvolemic currenlty, no SOB.  -has AICD. Continue home lasix 40mg  BID po and spironolatone 25mg  daily. Also cont amlodopine and metoprolol  - cont asa 81mg    DM II - uncontrolled. Last hgba1c 10.9, this time 12.7.  - takes metformin, lantus 44 am, and 8, 12, 10 meal time insulins. CBG here 200-230's. - lantus 30, 6 meal times, and SSI-moderate. Will adjust this based on CBG monitoring.  Weight loss - concerning for malignancy.  - last cscope 2 years ago  benign other than 1 polyp. Weight loss could be 2/2 to metformin as patient mentioned  - will do FOBT.  HLD continue atorvastatin 80 mg daily  COPD - cont albuterol nebs PRN  dvt ppx: heparin.  Dispo: Disposition is deferred at this time, awaiting improvement of current medical problems.  Anticipated discharge in approximately 2-3 day(s).   The patient does have a current PCP Wilber Oliphant, MD) and does need an [facility] hospital follow-up appointment after discharge.  The patient does not have transportation limitations that hinder transportation to clinic appointments.  .Services Needed at time of discharge: Y = Yes, Blank = No PT:   OT:   RN:   Equipment:   Other:     LOS: 1 day   Dellia Nims, MD 07/13/2014, 12:12 PM

## 2014-07-13 NOTE — Progress Notes (Signed)
Utilization Review Completed.   Atzin Buchta, RN, BSN Nurse Case Manager  

## 2014-07-14 LAB — GLUCOSE, CAPILLARY
Glucose-Capillary: 117 mg/dL — ABNORMAL HIGH (ref 70–99)
Glucose-Capillary: 201 mg/dL — ABNORMAL HIGH (ref 70–99)
Glucose-Capillary: 104 mg/dL — ABNORMAL HIGH (ref 70–99)
Glucose-Capillary: 229 mg/dL — ABNORMAL HIGH (ref 70–99)

## 2014-07-14 LAB — BASIC METABOLIC PANEL
Anion gap: 15 (ref 5–15)
BUN: 19 mg/dL (ref 6–23)
CO2: 29 mEq/L (ref 19–32)
Calcium: 9.4 mg/dL (ref 8.4–10.5)
Chloride: 99 mEq/L (ref 96–112)
Creatinine, Ser: 2.04 mg/dL — ABNORMAL HIGH (ref 0.50–1.10)
GFR calc Af Amer: 29 mL/min — ABNORMAL LOW (ref >90)
GFR calc non Af Amer: 25 mL/min — ABNORMAL LOW (ref >90)
Glucose, Bld: 112 mg/dL — ABNORMAL HIGH (ref 70–99)
Potassium: 3.5 mEq/L — ABNORMAL LOW (ref 3.7–5.3)
Sodium: 143 mEq/L (ref 137–147)

## 2014-07-14 LAB — CLOSTRIDIUM DIFFICILE BY PCR: Toxigenic C. Difficile by PCR: NEGATIVE

## 2014-07-14 LAB — OCCULT BLOOD X 1 CARD TO LAB, STOOL: Fecal Occult Bld: NEGATIVE

## 2014-07-14 MED ORDER — SODIUM CHLORIDE 0.9 % IV BOLUS (SEPSIS)
500.0000 mL | Freq: Once | INTRAVENOUS | Status: AC
  Administered 2014-07-14: 500 mL via INTRAVENOUS

## 2014-07-14 MED ORDER — METOCLOPRAMIDE HCL 5 MG PO TABS: 5.0000 mg | ORAL_TABLET | Freq: Three times a day (TID) | ORAL | Status: DC

## 2014-07-14 MED ORDER — AMLODIPINE BESYLATE 5 MG PO TABS
5.0000 mg | ORAL_TABLET | Freq: Every day | ORAL | Status: DC
  Filled 2014-07-14: qty 1

## 2014-07-14 MED ORDER — SODIUM CHLORIDE 0.9 % IV SOLN: INTRAVENOUS | Status: AC

## 2014-07-14 MED ORDER — SODIUM CHLORIDE 0.9 % IV BOLUS (SEPSIS): 250.0000 mL | Freq: Once | INTRAVENOUS | Status: DC

## 2014-07-14 MED ORDER — METFORMIN HCL 500 MG PO TABS: 500.0000 mg | ORAL_TABLET | Freq: Two times a day (BID) | ORAL | Status: DC

## 2014-07-14 MED ORDER — GLUCERNA SHAKE PO LIQD
237.0000 mL | Freq: Every day | ORAL | Status: DC
  Administered 2014-07-15 – 2014-07-17 (×2): 237 mL via ORAL

## 2014-07-14 MED ORDER — PANTOPRAZOLE SODIUM 40 MG PO TBEC: 40.0000 mg | DELAYED_RELEASE_TABLET | Freq: Every day | ORAL | Status: DC

## 2014-07-14 NOTE — Progress Notes (Signed)
Patient ID: [name], female   DOB: Feb 06, 1951, 63 y.o.   MRN: [id number] Medicine attending discharge note: I personally examined this patient on the day of planned discharge and I concur with the evaluation and discharge plan as recorded by resident physician Dr. Dellia Nims.  Pleasant 63 year old insulin-dependent diabetic who has had recurrent bouts of unexplained nausea and vomiting over the last year. She presented with recurrent nausea and vomiting. No obvious precipitating factors. No signs or symptoms of infection. Her examination was unremarkable except for mild epigastric tenderness by some examiners. Regular abdominal x-rays showed a nonobstructive bowel gas pattern with no other abnormalities. She was treated symptomatically with gentle hydration. Diuretics were held. She received antiemetics and proton pump inhibitors.  Her symptoms suggested diabetic gastroparesis. She was started on a trial of Reglan. Her symptoms resolved and she was eating a solid diet at time of discharge. Upper GI with barium swallow is planned as an outpatient to look for any additional pathology.  She will followup in our internal medicine clinic.

## 2014-07-14 NOTE — Progress Notes (Signed)
Subjective:  Feeling better today after the reglan was started yesterday. No longer having vomiting. Tolerated food, had BM. Feeling better overall. Ready to go home.  Objective: Vital signs in last 24 hours: Filed Vitals:   07/13/14 0403 07/13/14 1504 07/13/14 2009 07/14/14 0436  BP: 146/75 144/71 144/71 99/53  Pulse: 57 82 79 70  Temp: 98.9 F (37.2 C) 97.8 F (36.6 C) 98.8 F (37.1 C) 97.8 F (36.6 C)  TempSrc: Oral Oral Oral Oral  Resp: 18 17 18 18   Height:      Weight:      SpO2: 95% 100% 100% 95%   Weight change:   Intake/Output Summary (Last 24 hours) at 07/14/14 0725 Last data filed at 07/13/14 1700  Gross per 24 hour  Intake      0 ml  Output      0 ml  Net      0 ml   Vitals reviewed. General: resting in bed, appears to be tired from vomitting HEENT: PERRL, EOMI, no scleral icterus Cardiac: RRR, no rubs, murmurs or gallops Pulm: clear to auscultation bilaterally, no wheezes, rales, or rhonchi Abd: soft, some TTP on epigastric region, nondistended, BS present Ext: warm and well perfused, no pedal edema Neuro: alert and oriented X3, cranial nerves II-XII grossly intact, strength and sensation to light touch equal in bilateral upper and lower extremities  Lab Results: Basic Metabolic Panel:  Recent Labs Lab 07/12/14 1040 07/13/14 0427  NA 136* 137  K 4.0 4.6  CL 98 95*  CO2 23 27  GLUCOSE 208* 230*  BUN 9 12  CREATININE 0.76 0.96  CALCIUM 9.5 9.4   Liver Function Tests:  Recent Labs Lab 07/12/14 0140  AST 32  ALT 17  ALKPHOS 117  BILITOT 0.7  PROT 7.5  ALBUMIN 3.8    Recent Labs Lab 07/12/14 0140  LIPASE 35   No results found for this basename: AMMONIA,  in the last 168 hours CBC:  Recent Labs Lab 07/12/14 0140  WBC 8.6  NEUTROABS 6.7  HGB 14.7  HCT 44.2  MCV 85.3  PLT 183   Cardiac Enzymes:  Recent Labs Lab 07/12/14 0140 07/12/14 1855  TROPONINI <0.30 <0.30   BNP:  Recent Labs Lab 07/12/14 0526  PROBNP  718.4*   D-Dimer: No results found for this basename: DDIMER,  in the last 168 hours CBG:  Recent Labs Lab 07/12/14 1816 07/13/14 0714 07/13/14 1108 07/13/14 1620 07/13/14 2126 07/14/14 0603  GLUCAP 293* 226* 199* 158* 124* 117*   Hemoglobin A1C:  Recent Labs Lab 07/12/14 1855  HGBA1C 12.7*   Fasting Lipid Panel: No results found for this basename: CHOL, HDL, LDLCALC, TRIG, CHOLHDL, LDLDIRECT,  in the last 168 hours Thyroid Function Tests: No results found for this basename: TSH, T4TOTAL, FREET4, T3FREE, THYROIDAB,  in the last 168 hours Coagulation: No results found for this basename: LABPROT, INR,  in the last 168 hours Anemia Panel: No results found for this basename: VITAMINB12, FOLATE, FERRITIN, TIBC, IRON, RETICCTPCT,  in the last 168 hours Urine Drug Screen: Drugs of Abuse     Component Value Date/Time   LABOPIA NONE DETECTED 05/15/2014 1141   COCAINSCRNUR NONE DETECTED 05/15/2014 1141   LABBENZ NONE DETECTED 05/15/2014 1141   AMPHETMU NONE DETECTED 05/15/2014 1141   THCU NONE DETECTED 05/15/2014 1141   LABBARB NONE DETECTED 05/15/2014 1141    Alcohol Level: No results found for this basename: ETH,  in the last 168 hours Urinalysis:  Recent  Labs Lab 07/12/14 0301  COLORURINE YELLOW  LABSPEC 1.021  PHURINE 6.0  GLUCOSEU >1000*  HGBUR NEGATIVE  BILIRUBINUR NEGATIVE  KETONESUR 40*  PROTEINUR NEGATIVE  UROBILINOGEN 1.0  NITRITE NEGATIVE  LEUKOCYTESUR NEGATIVE   Misc. Labs:   Micro Results: No results found for this or any previous visit (from the past 240 hour(s)). Studies/Results: No results found. Medications: I have reviewed the patient's current medications. Scheduled Meds: . amLODipine  10 mg Oral Daily  . aspirin EC  81 mg Oral Daily  . atorvastatin  80 mg Oral QHS  . citalopram  20 mg Oral Daily  . enoxaparin (LOVENOX) injection  40 mg Subcutaneous Q24H  . furosemide  40 mg Oral BID  . gabapentin  300 mg Oral QHS  . insulin aspart  0-15  Units Subcutaneous TID WC  . insulin aspart  0-7 Units Subcutaneous QHS  . insulin aspart  6 Units Subcutaneous TID WC  . insulin glargine  30 Units Subcutaneous Daily  . lisinopril  40 mg Oral Daily  . metoCLOPramide  5 mg Oral TID AC  . metoprolol  200 mg Oral Daily  . pantoprazole  40 mg Oral QAC breakfast  . spironolactone  25 mg Oral Daily   Continuous Infusions:  PRN Meds:.acetaminophen, acetaminophen, albuterol, nitroGLYCERIN, ondansetron (ZOFRAN) IV, ondansetron Assessment/Plan: Principal Problem:   Nausea and vomiting Active Problems:   Chronic combined systolic and diastolic heart failure   COPD, moderate   Automatic implantable cardioverter-defibrillator in situ   Type 2 diabetes mellitus not at goal  63 yo female with DM II, HTN, combined CHF comes in with n/v and subsequently having some chest soreness. Chest soreness resolved.  Nausea/vomitting - - improving with reglan and PPI. Likely gastroparesis from diabetes. This has been going on for a long time. Recurs several times a year. This could be 2/2 diabetic gastroparesis given her uncontrolled diabetes. Other ddx includes gastroenteritis, GERD, esophageal stricture, ulcers.   - will get Upper GI study to evaluate her  AKI with contraction alkolosis  - crt bumped, also has low K+ and high bicarb -  likely from pre-renal due to continuation of her home lasix while she was not taking much fluid.  - will hold her lisinopril + lasix for now. Will have her follow up closely at clinic. Expecting crt will improve. Will not give her fluid as she appears euvolemic on exam currently. Also she started eating again.   Combined CHF - last echo 03/2014 EF 35-40% with grade 1 dysfunction, mild MR, AR, LA enlargement.  -appears to be euvolemic currenlty, no SOB.  -has AICD. hold home lisinopril and lasix 40mg  BID po for now, continue spironolatone 25mg  daily. Also cont amlodopine and metoprolol - cont asa 81mg    DM II -  uncontrolled. Last hgba1c 10.9, this time 12.7.  - takes metformin, lantus 44 am, and 8, 12, 10 meal time insulins. CBG here 200-230's. - lantus 30, 6 meal times, and SSI-moderate. Doing well on it here. Will send home with home regimen. Needs outpatient [facility] with [facility].   Weight loss - concerning for malignancy but likely from gastroparesis.  - last cscope 2 years ago benign other than 1 polyp. Weight loss could be 2/2 to metformin as patient mentioned  - will do FOBT.   HLD continue atorvastatin 80 mg daily  COPD - cont albuterol nebs PRN  dvt ppx: heparin.  Dispo: Disposition is deferred at this time, awaiting improvement of current medical problems.  Anticipated discharge in approximately 2-3 day(s).   The patient does have a current PCP Wilber Oliphant, MD) and does need an [facility] hospital follow-up appointment after discharge.  The patient does not have transportation limitations that hinder transportation to clinic appointments.  .Services Needed at time of discharge: Y = Yes, Blank = No PT:   OT:   RN:   Equipment:   Other:     LOS: 2 days   Dellia Nims, MD 07/14/2014, 7:25 AM

## 2014-07-14 NOTE — Discharge Instructions (Addendum)
Thank you for trusting [place] with your medical care!  You were hospitalized for diabetic gastroparesis (nerve damage from your diabetes slowing down your gut).   Please take Reglan 5mg  with meals three times a day for the next two weeks. Also take Protonix 40mg  daily. If you start having the same symptoms again, please call immediately so we can schedule you to be seen.  For your blood pressure, STOP all the medications you were taking BEFORE you came into the hospital. Please pick up two new prescriptions from the pharmacy for metoprolol 50mg  and lisinopril 10mg .  Please make it to your appointment on Monday, so we can decide what to do next with your blood pressure medications.   Gastroparesis  Gastroparesis is also called slowed stomach emptying (delayed gastric emptying). It is a condition in which the stomach takes too long to empty its contents. It often happens in people with diabetes.  CAUSES  Gastroparesis happens when nerves to the stomach are damaged or stop working. When the nerves are damaged, the muscles of the stomach and intestines do not work normally. The movement of food is slowed or stopped. High blood glucose (sugar) causes changes in nerves and can damage the blood vessels that carry oxygen and nutrients to the nerves. RISK FACTORS  Diabetes.  Post-viral syndromes.  Eating disorders (anorexia, bulimia).  Surgery on the stomach or vagus nerve.  Gastroesophageal reflux disease (rarely).  Smooth muscle disorders (amyloidosis, scleroderma).  Metabolic disorders, including hypothyroidism.  Parkinson disease. SYMPTOMS   Heartburn.  Feeling sick to your stomach (nausea).  Vomiting of undigested food.  An early feeling of fullness when eating.  Weight loss.  Abdominal bloating.  Erratic blood glucose levels.  Lack of appetite.  Gastroesophageal reflux.  Spasms of the stomach wall. Complications can include:  Bacterial overgrowth in stomach. Food stays  in the stomach and can ferment and cause bacteria to grow.  Weight loss due to difficulty digesting and absorbing nutrients.  Vomiting.  Obstruction in the stomach. Undigested food can harden and cause nausea and vomiting.  Blood glucose fluctuations caused by inconsistent food absorption. DIAGNOSIS  The diagnosis of gastroparesis is confirmed through one or more of the following tests:  Barium X-rays and scans. These tests look at how long it takes for food to move through the stomach.  Gastric manometry. This test measures electrical and muscular activity in the stomach. A thin tube is passed down the throat into the stomach. The tube contains a wire that takes measurements of the stomach's electrical and muscular activity as it digests liquids and solid food.  Endoscopy. This procedure is done with a long, thin tube called an endoscope. It is passed through the mouth and gently down the esophagus into the stomach. This tube helps the caregiver look at the lining of the stomach to check for any abnormalities.  Ultrasonography. This can rule out gallbladder disease or pancreatitis. This test will outline and define the shape of the gallbladder and pancreas. TREATMENT   Treatments may include:  Exercise.  Medicines to control nausea and vomiting.  Medicines to stimulate stomach muscles.  Changes in what and when you eat.  Having smaller meals more often.  Eating low-fiber forms of high-fiber foods, such as eating cooked vegetables instead of raw vegetables.  Eating low-fat foods.  Consuming liquids, which are easier to digest.  In severe cases, feeding tubes and intravenous (IV) feeding may be needed. It is important to note that in most cases, treatment does not  cure gastroparesis. It is usually a lasting (chronic) condition. Treatment helps you manage the underlying condition so that you can be as healthy and comfortable as possible. Other treatments  A gastric  neurostimulator has been developed to assist people with gastroparesis. The battery-operated device is surgically implanted. It emits mild electrical pulses to help improve stomach emptying and to control nausea and vomiting.  The use of botulinum toxin has been shown to improve stomach emptying by decreasing the prolonged contractions of the muscle between the stomach and the small intestine (pyloric sphincter). The benefits are temporary. SEEK MEDICAL CARE IF:   You have diabetes and you are having problems keeping your blood glucose in goal range.  You are having nausea, vomiting, bloating, or early feelings of fullness with eating.  Your symptoms do not change with a change in diet. Document Released: 09/19/2005 Document Revised: 01/14/2013 Document Reviewed: 02/26/2009 [facility] Patient Information 2015 [place], [place]. This information is not intended to replace advice given to you by your health care provider. Make sure you discuss any questions you have with your health care provider.

## 2014-07-14 NOTE — Progress Notes (Signed)
MD paged regarding low BP, lack of appetite, and continued loose stools, bolus orders received, will continue to monitor pt closely Rickard Rhymes, RN

## 2014-07-14 NOTE — Progress Notes (Signed)
INITIAL NUTRITION ASSESSMENT  DOCUMENTATION CODES Per approved criteria  -Morbid Obesity   INTERVENTION: Glucerna Shake po daily, each supplement provides 220 kcal and 10 grams of protein RD to follow for nutrition care plan  NUTRITION DIAGNOSIS: Inadequate oral intake related to decreased appetite as evidenced by pt report  Goal: Pt to meet >/= 90% of their estimated nutrition needs   Monitor:  PO & supplemental intake, weight, labs, I/O's  Reason for Assessment: Malnutrition Screening Tool Report  63 y.o. female  Admitting Dx: Nausea and vomiting  ASSESSMENT: 63 year old Female with hypertension, DM and combined diastolic and systolic heart failure; presented with recurrent episode of nausea and vomiting.  Patient reports a decreased appetite since being hospitalized; PO intake 0-10% per flowsheet records; she endorses weight loss; per wt readings below, patient has had an approximate 14 lb weight loss since beginning of July 2015 (6%) -- not significant for time frame; she is amenable to trying Glucerna Shake supplements; RD to order.  No muscle or subcutaneous fat depletion noticed.  Height: Ht Readings from Last 1 Encounters:  07/12/14 5' (1.524 m)    Weight: Wt Readings from Last 1 Encounters:  07/12/14 218 lb 7.6 oz (99.1 kg)    Ideal Body Weight: 100 lb  % Ideal Body Weight: 218%  Wt Readings from Last 10 Encounters:  07/12/14 218 lb 7.6 oz (99.1 kg)  07/08/14 224 lb (101.606 kg)  05/22/14 226 lb 1.6 oz (102.558 kg)  04/08/14 232 lb 8 oz (105.461 kg)  04/02/14 234 lb (106.142 kg)  04/02/14 234 lb (106.142 kg)  03/25/14 234 lb (106.142 kg)  01/27/14 233 lb 12.8 oz (106.051 kg)  10/08/13 238 lb 9.6 oz (108.228 kg)  09/03/13 237 lb 4.8 oz (107.639 kg)    Usual Body Weight: 224 lb  % Usual Body Weight: 97%  BMI:  Body mass index is 42.67 kg/(m^2).  Estimated Nutritional Needs: Kcal: 1500-1700 Protein: 70-80 gm Fluid: 1.5-1.7 L  Skin:  Intact  Diet Order: Carb Control  EDUCATION NEEDS: -No education needs identified at this time   Intake/Output Summary (Last 24 hours) at 07/14/14 1544 Last data filed at 07/14/14 1300  Gross per 24 hour  Intake    600 ml  Output      0 ml  Net    600 ml    Labs:   Recent Labs Lab 07/12/14 1040 07/13/14 0427 07/14/14 0541  NA 136* 137 143  K 4.0 4.6 3.5*  CL 98 95* 99  CO2 23 27 29   BUN 9 12 19   CREATININE 0.76 0.96 2.04*  CALCIUM 9.5 9.4 9.4  GLUCOSE 208* 230* 112*    CBG (last 3)   Recent Labs  07/13/14 2126 07/14/14 0603 07/14/14 1123  GLUCAP 124* 117* 201*    Scheduled Meds: . amLODipine  10 mg Oral Daily  . aspirin EC  81 mg Oral Daily  . atorvastatin  80 mg Oral QHS  . citalopram  20 mg Oral Daily  . enoxaparin (LOVENOX) injection  40 mg Subcutaneous Q24H  . furosemide  40 mg Oral BID  . gabapentin  300 mg Oral QHS  . insulin aspart  0-15 Units Subcutaneous TID WC  . insulin aspart  0-7 Units Subcutaneous QHS  . insulin aspart  6 Units Subcutaneous TID WC  . insulin glargine  30 Units Subcutaneous Daily  . lisinopril  40 mg Oral Daily  . metoCLOPramide  5 mg Oral TID AC  . metoprolol  200 mg  Oral Daily  . pantoprazole  40 mg Oral QAC breakfast  . spironolactone  25 mg Oral Daily    Continuous Infusions:   Past Medical History  Diagnosis Date  . Hyperthyroidism, subclinical   . Post menopausal syndrome   . Obesity   . Cardiac defibrillator in situ     Atlas II VR (SJM) implanted by Dr Lovena Le  . Eczema   . Lipoma   . Chronic ulcer of leg   . Hyperlipidemia   . Chronic combined systolic and diastolic heart failure     a. EF 35-40% in past;  b. Echo 7/13:  EF 45-50%, Gr 2 diast dysfn, mild AI, mild MAC, trivial MR, mild LAE, PASP 47.  [name] NICM (nonischemic cardiomyopathy)   . Diabetes mellitus   . HTN (hypertension)   . Elevated alkaline phosphatase level     GGT and 5'nucleotidase 8/13 normal  . Hx of cardiac cath     a. [place] 2003  normal;  b. LHC 6/13:  Mild calcification in the LM, o/w normal coronary arteries, EF 45%.   . Sleep apnea     pt denies 04/12/2013  . COPD (chronic obstructive pulmonary disease)     O2 at night  . Automatic implantable cardioverter-defibrillator in situ   . CHF (congestive heart failure)   . Depression   . Implantable cardioverter-defibrillator (ICD) generator end of life     Past Surgical History  Procedure Laterality Date  . Cardiac defibrillator placement  05/04/2007    SJM Atlas II VR ICD  . Hysteroscopy    . Cardiac defibrillator placement    . Abdominal hysterectomy    . Cardiac catheterization    . Insert / replace / remove pacemaker    . Tubal ligation    . Hernia repair    . Colonoscopy N/A 04/12/2013    Procedure: COLONOSCOPY;  Surgeon: Beryle Beams, MD;  Location: [facility];  Service: Endoscopy;  Laterality: N/A;  pt.has defibrilator    Arthur Holms, RD, LDN Pager #: [phone] After-Hours Pager #: [phone]

## 2014-07-14 NOTE — Discharge Summary (Signed)
Name: [name] MRN: [id number] DOB: 1951/04/08 63 y.o. PCP: Wilber Oliphant, MD  Date of Admission: 07/12/2014  1:04 AM Date of Discharge: 07/18/2014 Attending Physician: Gilles Chiquito, MD  Discharge Diagnosis:  Principal Problem:   Diabetic gastroparesis associated with type 2 diabetes mellitus Active Problems:   Chronic combined systolic and diastolic heart failure   COPD, moderate   Automatic implantable cardioverter-defibrillator in situ   Type 2 diabetes mellitus not at goal   ATN (acute tubular necrosis)  Discharge Medications:   Medication List    STOP taking these medications       amLODipine 10 MG tablet  Commonly known as:  NORVASC     furosemide 40 MG tablet  Commonly known as:  LASIX     metFORMIN 500 MG tablet  Commonly known as:  GLUCOPHAGE     spironolactone 25 MG tablet  Commonly known as:  ALDACTONE      TAKE these medications       albuterol 108 (90 BASE) MCG/ACT inhaler  Commonly known as:  VENTOLIN HFA  Inhale 2 puffs into the lungs every 6 (six) hours as needed. Shortness of breath     aspirin EC 81 MG tablet  Take 81 mg by mouth daily.     atorvastatin 80 MG tablet  Commonly known as:  LIPITOR  Take 1 tablet (80 mg total) by mouth at bedtime.     citalopram 20 MG tablet  Commonly known as:  CELEXA  Take 1 tablet (20 mg total) by mouth daily.     gabapentin 300 MG capsule  Commonly known as:  NEURONTIN  Take 1 capsule (300 mg total) by mouth at bedtime.     glucose blood test strip  Commonly known as:  ONE TOUCH ULTRA TEST  Use as instructed by doctor up to 3x daily,dx code 250.02 insulin requiring     insulin glargine 100 UNIT/ML injection  Commonly known as:  LANTUS  Inject 44 Units into the skin daily with breakfast.     insulin lispro 100 UNIT/ML injection  Commonly known as:  HUMALOG  Inject 8 units with breakfast and 12 units with lunch and 10 units with dinner     lisinopril 10 MG tablet  Commonly known as:   PRINIVIL,ZESTRIL  Take 1 tablet (10 mg total) by mouth daily.     metoCLOPramide 5 MG tablet  Commonly known as:  REGLAN  Take 1 tablet (5 mg total) by mouth 3 (three) times daily before meals.     metoprolol succinate 50 MG 24 hr tablet  Commonly known as:  TOPROL-XL  Take 1 tablet (50 mg total) by mouth daily. Take with or immediately following a meal.     nitroGLYCERIN 0.4 MG SL tablet  Commonly known as:  NITROSTAT  Place 0.4 mg under the tongue every 5 (five) minutes as needed for chest pain.     pantoprazole 40 MG tablet  Commonly known as:  PROTONIX  Take 1 tablet (40 mg total) by mouth daily before breakfast.        Disposition and follow-up:   PeggyBliss FAIRIE [name] was discharged from [facility] in Stable condition.  At the hospital follow up visit please address:  1. Diabetic gastroparesis 2/2 poorly controlled DM2: Reassess symptoms of nausea/vomiting and adherence to Reglan & Protonix as well as glycemic control and possible referral to diabetic educator.  2. HTN: Reassess BP control and restarting home meds as they were stopped  due to ATN 2/2 low BP.   3.  Labs / imaging needed at time of follow-up: BMET (crt), upper GI study to evaluate for any abnormality if her symptoms don't improve.  4.  Pending labs/ test needing follow-up: none  Follow-up Appointments: Follow-up Information   Follow up with Jessee Avers, MD On 07/21/2014. (345 PM)    Specialty:  Internal Medicine   Contact information:   [address] [place] [address] [phone]       Discharge Instructions: Discharge Instructions   ([place]) Call MD:  Anytime you have any of the following symptoms: 1) 3 pound weight gain in 24 hours or 5 pounds in 1 week 2) shortness of breath, with or without a dry hacking cough 3) swelling in the hands, feet or stomach 4) if you have to sleep on extra pillows at night in order to breathe.    Complete by:  As directed      Call  MD for:  extreme fatigue    Complete by:  As directed      Call MD for:  persistant dizziness or light-headedness    Complete by:  As directed      Call MD for:  persistant nausea and vomiting    Complete by:  As directed      Diet - low sodium heart healthy    Complete by:  As directed      Increase activity slowly    Complete by:  As directed            Consultations:    Procedures Performed:  Dg Abd Acute W/chest  07/12/2014   CLINICAL DATA:  Initial evaluation for vomiting  EXAM: ACUTE ABDOMEN SERIES (ABDOMEN 2 VIEW & CHEST 1 VIEW)  COMPARISON:  Prior study from 08/31/2012  FINDINGS: Single lead left-sided transvenous pacemaker/AICD is unchanged. Cardiomegaly is stable. Mediastinal silhouette remains within normal limits.  There is subtle patchy infiltrate within the right upper lobe, which may reflect mild asymmetric edema or possibly infiltrate. Lungs are otherwise clear. No pneumothorax.  No free air seen on lateral tube in is dissection. Visualized bowel gas pattern is nonobstructive. No abnormal bowel wall thickening. No soft tissue mass or abnormal calcification.  No acute osseus abnormality. Degenerative changes noted within the lower lumbar spine about the bilateral hips.  IMPRESSION: 1. Nonobstructive bowel gas pattern with no radiographic evidence of acute intra-abdominal abnormality. 2. Mild asymmetric opacity within the right upper lobe. Finding is nonspecific, and may reflect asymmetric edema versus infiltrate. Asymmetric pulmonary edema involving the right upper lobe can be seen with underlying mitral valve disease.   Electronically Signed   By: Jeannine Boga M.D.   On: 07/12/2014 04:54    Admission HPI:   63 yo female with combined CHF EF 35-40% on AICD, NICM, HTN, HLD, DM2 here with nausea and vomitting since yesterday morning. It started out with whatever she ate then became bilious in color. Nonbloody. Denies unusual food. Denies sick contacts. After several  emesis, she is having some soreness in her chest, mild in nature. Denies any sob, palpitations, orthopnea. Has some mild pedal edema. Takes lasix at home. Hasn't taken any of her meds for last 3 days.  Denies fever/chills, diarrhea. Had been losing weight which she states could be from metformin. She was 245lb on 04/2013, now she is 218. Feels somewhat better currently than she felt before.  Smokes 4 cigarettes a day, accessional alcohol use, no recreational drugs.  Family  hx significant for aunt with colon cancer. She had her last cscope 2 years ago, had 1 polyp, was told to f/up after 5 years from prior cscope. Follows up with Dr. Arlan Organ. [facility] Bre[facility] cards.    Hospital Course by problem list: Principal Problem:   Diabetic gastroparesis associated with type 2 diabetes mellitus Active Problems:   Chronic combined systolic and diastolic heart failure   COPD, moderate   Automatic implantable cardioverter-defibrillator in situ   Type 2 diabetes mellitus not at goal   ATN (acute tubular necrosis)   Diabetic gastroparesis: Hospitalized for nausea/vomiting which improved with reglan and PPI. A1c 12.7 on admission, and she was continued on home regimen of insulin (Lantus 30 units QHS & Novolog 6 units TID) with CBGs trending 100-200s. She was tolerating a diet at the time of discharge and was given a 14-day supply of Reglan and asked to resume her home regimen of insulin. She will require an upper GI study outpatient when she is off medication to confirm diagnosis should symptoms not improve as well as better insulin titration.  AKI 2/2 ATN from BP meds: Likely 2/2 BP meds. Home med regimen (lisinopril 40mg , metoprolol 200XL, lasix 40 BID, amlodopine 10 daily) was started in hospital stay but resulted in her becoming hypotensive. Crt increased 0.9->4.5 but improved off meds and on IV fluids. [facility] was consulted and was agreeable to help with further medication management. She was restarted on lisinopril 10mg   and metoprolol 50mg  based off her DM2 and CHF and will require further titration as outpatient and close follow-up.  Combined CHF: Remained stable on home medications.  HLD: Remained stable on home medications.  COPD: Remained stable on home medications.  Discharge Vitals:   BP 179/74  Pulse 70  Temp(Src) 98.7 F (37.1 C) (Oral)  Resp 16  Ht 5' (1.524 m)  Wt 227 lb 6.4 oz (103.148 kg)  BMI 44.41 kg/m2  SpO2 97%  Discharge Labs:  No results found for this or any previous visit (from the past 24 hour(s)).  Signed: Charlott Rakes, MD 07/20/2014, 10:13 AM    Services Ordered on Discharge: [place] Ordered on Discharge: none

## 2014-07-14 NOTE — Progress Notes (Signed)
MD notified of CR jump and low BP, MD made aware that BP meds not given, no orders received,  Rickard Rhymes, RN

## 2014-07-15 ENCOUNTER — Inpatient Hospital Stay (HOSPITAL_COMMUNITY): Payer: Medicare PPO

## 2014-07-15 ENCOUNTER — Encounter (HOSPITAL_COMMUNITY): Payer: Self-pay | Admitting: Oncology

## 2014-07-15 DIAGNOSIS — N170 Acute kidney failure with tubular necrosis: Secondary | ICD-10-CM

## 2014-07-15 HISTORY — DX: Acute kidney failure with tubular necrosis: N17.0

## 2014-07-15 LAB — CBC WITH DIFFERENTIAL/PLATELET
Basophils Absolute: 0 10*3/uL (ref 0.0–0.1)
Basophils Relative: 0 % (ref 0–1)
Eosinophils Absolute: 0.3 10*3/uL (ref 0.0–0.7)
Eosinophils Relative: 3 % (ref 0–5)
HCT: 44.1 % (ref 36.0–46.0)
Hemoglobin: 14.2 g/dL (ref 12.0–15.0)
Lymphocytes Relative: 25 % (ref 12–46)
Lymphs Abs: 2.6 10*3/uL (ref 0.7–4.0)
MCH: 27.2 pg (ref 26.0–34.0)
MCHC: 32.2 g/dL (ref 30.0–36.0)
MCV: 84.5 fL (ref 78.0–100.0)
Monocytes Absolute: 1.1 10*3/uL — ABNORMAL HIGH (ref 0.1–1.0)
Monocytes Relative: 11 % (ref 3–12)
Neutro Abs: 6.4 10*3/uL (ref 1.7–7.7)
Neutrophils Relative %: 61 % (ref 43–77)
Platelets: 186 10*3/uL (ref 150–400)
RBC: 5.22 MIL/uL — ABNORMAL HIGH (ref 3.87–5.11)
RDW: 15.9 % — ABNORMAL HIGH (ref 11.5–15.5)
WBC: 10.4 10*3/uL (ref 4.0–10.5)

## 2014-07-15 LAB — GLUCOSE, CAPILLARY
Glucose-Capillary: 184 mg/dL — ABNORMAL HIGH (ref 70–99)
Glucose-Capillary: 209 mg/dL — ABNORMAL HIGH (ref 70–99)
Glucose-Capillary: 225 mg/dL — ABNORMAL HIGH (ref 70–99)
Glucose-Capillary: 225 mg/dL — ABNORMAL HIGH (ref 70–99)

## 2014-07-15 LAB — SODIUM, URINE, RANDOM: Sodium, Ur: 61 mEq/L

## 2014-07-15 LAB — COMPREHENSIVE METABOLIC PANEL
ALT: 15 U/L (ref 0–35)
AST: 23 U/L (ref 0–37)
Albumin: 3.6 g/dL (ref 3.5–5.2)
Alkaline Phosphatase: 108 U/L (ref 39–117)
Anion gap: 18 — ABNORMAL HIGH (ref 5–15)
BUN: 31 mg/dL — ABNORMAL HIGH (ref 6–23)
CO2: 20 mEq/L (ref 19–32)
Calcium: 8.5 mg/dL (ref 8.4–10.5)
Chloride: 97 mEq/L (ref 96–112)
Creatinine, Ser: 4.55 mg/dL — ABNORMAL HIGH (ref 0.50–1.10)
GFR calc Af Amer: 11 mL/min — ABNORMAL LOW (ref >90)
GFR calc non Af Amer: 10 mL/min — ABNORMAL LOW (ref >90)
Glucose, Bld: 161 mg/dL — ABNORMAL HIGH (ref 70–99)
Potassium: 4.3 mEq/L (ref 3.7–5.3)
Sodium: 135 mEq/L — ABNORMAL LOW (ref 137–147)
Total Bilirubin: 0.4 mg/dL (ref 0.3–1.2)
Total Protein: 7.4 g/dL (ref 6.0–8.3)

## 2014-07-15 LAB — CREATININE, URINE, RANDOM: Creatinine, Urine: 282.35 mg/dL

## 2014-07-15 LAB — BASIC METABOLIC PANEL
Anion gap: 19 — ABNORMAL HIGH (ref 5–15)
BUN: 30 mg/dL — ABNORMAL HIGH (ref 6–23)
CO2: 22 mEq/L (ref 19–32)
Calcium: 8.6 mg/dL (ref 8.4–10.5)
Chloride: 96 mEq/L (ref 96–112)
Creatinine, Ser: 4.49 mg/dL — ABNORMAL HIGH (ref 0.50–1.10)
GFR calc Af Amer: 11 mL/min — ABNORMAL LOW (ref >90)
GFR calc non Af Amer: 10 mL/min — ABNORMAL LOW (ref >90)
Glucose, Bld: 169 mg/dL — ABNORMAL HIGH (ref 70–99)
Potassium: 3.8 mEq/L (ref 3.7–5.3)
Sodium: 137 mEq/L (ref 137–147)

## 2014-07-15 LAB — URINALYSIS W MICROSCOPIC (NOT AT ARMC)
Glucose, UA: 500 mg/dL — AB
Hgb urine dipstick: NEGATIVE
Ketones, ur: 15 mg/dL — AB
Leukocytes, UA: NEGATIVE
Nitrite: NEGATIVE
Protein, ur: 30 mg/dL — AB
Specific Gravity, Urine: 1.022 (ref 1.005–1.030)
Urobilinogen, UA: 1 mg/dL (ref 0.0–1.0)
pH: 5 (ref 5.0–8.0)

## 2014-07-15 MED ORDER — ENOXAPARIN SODIUM 30 MG/0.3ML ~~LOC~~ SOLN
30.0000 mg | SUBCUTANEOUS | Status: DC
  Administered 2014-07-15 – 2014-07-17 (×3): 30 mg via SUBCUTANEOUS
  Filled 2014-07-15 (×5): qty 0.3

## 2014-07-15 MED ORDER — SODIUM CHLORIDE 0.9 % IV BOLUS (SEPSIS)
500.0000 mL | Freq: Once | INTRAVENOUS | Status: AC
  Administered 2014-07-15: 500 mL via INTRAVENOUS

## 2014-07-15 MED ORDER — SODIUM CHLORIDE 0.9 % IV SOLN: INTRAVENOUS | Status: AC

## 2014-07-15 NOTE — Progress Notes (Signed)
Patient ID: [name], female   DOB: 10/25/1950, 63 y.o.   MRN: [id number] Medicine attending progress note: We planned to send the patient home yesterday when there was a sudden change in her condition. Her blood pressure medications, except for furosemide, were on hold and just resumed. She was on maximum doses of antihypertensives as an outpatient with Lasix 40 mg twice a day, lisinopril 40 mg daily, Toprol-XL 200 mg daily, and Aldactone 25 mg daily. With resumption of her antihypertensives, blood pressure fell from 144/71 down to as low as 72/52 with initial fall noted at 04:36 AM on October 11. There was a significant change in her renal function when assessed at 05 :41 on October 12 with rise in  creatinine from 0.96 up to 2.04 with minimal change in BUN going from 12 up to 19. Diuretic stopped. She was given one 1,000 cc normal saline fluid challenge. Furosemide, Norvasc, Aldactone, and lisinopril discontinued. Renal function is much worse today with BUN 30 and creatinine 4.49. Serum bicarbonate 22. Anion gap 19. Only new medications during this admission are Reglan and Protonix. She received no IV contrast agents.  I personally reviewed these changes with the patient. Findings compatible with acute tubular necrosis likely related to decreased renal perfusion from acute hypotension. Plan is supportive care. Strict  Attention to fluid status and electrolyte status. We will need to involve nephrology renal function continues to deteriorate.  Murriel Hopper, MD, [place]  Hematology-Oncology/Internal Medicine

## 2014-07-15 NOTE — Progress Notes (Signed)
07/15/2014 8:40 PM Progress Notes Bladder scan showed 236 cc of urine in patient's bladder.  Just after the patient voided 200 cc of urine. Physician made aware. Will continue to monitor. Lupita Dawn

## 2014-07-15 NOTE — Progress Notes (Signed)
Subjective:  Yesterday when I saw here she stated she was feeling better and not throwing up. However, I was told by RN in the afternoon yesterday that she still had some nausea/vomitting. She stated this morning that she is feeling better. No longer has nausea or vomitting. Has some loose stools. cdiff was negative.   Most importantly, her Crt bumped up over night. Her BP has been running low in 90's with some 72 and 81 SBP as well. Patient is urinating fine. No urinary complaints. Bladder scan shows no obstruction.  Objective: Vital signs in last 24 hours: Filed Vitals:   07/14/14 1349 07/14/14 1754 07/14/14 2041 07/15/14 0452  BP: 72/52 99/60 81/59  92/53  Pulse:   79 69  Temp: 97.7 F (36.5 C)  98 F (36.7 C) 97.9 F (36.6 C)  TempSrc:   Oral Oral  Resp:   18 18  Height:      Weight:      SpO2:   97% 95%   Weight change:   Intake/Output Summary (Last 24 hours) at 07/15/14 1119 Last data filed at 07/15/14 0812  Gross per 24 hour  Intake    480 ml  Output      0 ml  Net    480 ml   Vitals reviewed. General: resting in bed, NAD.  HEENT: PERRL, EOMI, no scleral icterus Cardiac: RRR, no rubs, murmurs or gallops Pulm: clear to auscultation bilaterally, no wheezes, rales, or rhonchi Abd: soft, some TTP on epigastric region, nondistended, BS present Ext: warm and well perfused, no pedal edema Neuro: alert and oriented X3, cranial nerves II-XII grossly intact, strength and sensation to light touch equal in bilateral upper and lower extremities  Lab Results: Basic Metabolic Panel:  Recent Labs Lab 07/14/14 0541 07/15/14 0558  NA 143 137  K 3.5* 3.8  CL 99 96  CO2 29 22  GLUCOSE 112* 169*  BUN 19 30*  CREATININE 2.04* 4.49*  CALCIUM 9.4 8.6   Liver Function Tests:  Recent Labs Lab 07/12/14 0140  AST 32  ALT 17  ALKPHOS 117  BILITOT 0.7  PROT 7.5  ALBUMIN 3.8    Recent Labs Lab 07/12/14 0140  LIPASE 35   No results found for this basename: AMMONIA,   in the last 168 hours CBC:  Recent Labs Lab 07/12/14 0140 07/15/14 0845  WBC 8.6 10.4  NEUTROABS 6.7 6.4  HGB 14.7 14.2  HCT 44.2 44.1  MCV 85.3 84.5  PLT 183 186   Cardiac Enzymes:  Recent Labs Lab 07/12/14 0140 07/12/14 1855  TROPONINI <0.30 <0.30   BNP:  Recent Labs Lab 07/12/14 0526  PROBNP 718.4*   D-Dimer: No results found for this basename: DDIMER,  in the last 168 hours CBG:  Recent Labs Lab 07/13/14 2126 07/14/14 0603 07/14/14 1123 07/14/14 1638 07/14/14 2055 07/15/14 0609  GLUCAP 124* 117* 201* 104* 229* 184*   Hemoglobin A1C:  Recent Labs Lab 07/12/14 1855  HGBA1C 12.7*   Fasting Lipid Panel: No results found for this basename: CHOL, HDL, LDLCALC, TRIG, CHOLHDL, LDLDIRECT,  in the last 168 hours Thyroid Function Tests: No results found for this basename: TSH, T4TOTAL, FREET4, T3FREE, THYROIDAB,  in the last 168 hours Coagulation: No results found for this basename: LABPROT, INR,  in the last 168 hours Anemia Panel: No results found for this basename: VITAMINB12, FOLATE, FERRITIN, TIBC, IRON, RETICCTPCT,  in the last 168 hours Urine Drug Screen: Drugs of Abuse     Component Value  Date/Time   LABOPIA NONE DETECTED 05/15/2014 1141   COCAINSCRNUR NONE DETECTED 05/15/2014 1141   LABBENZ NONE DETECTED 05/15/2014 1141   AMPHETMU NONE DETECTED 05/15/2014 1141   THCU NONE DETECTED 05/15/2014 1141   LABBARB NONE DETECTED 05/15/2014 1141    Alcohol Level: No results found for this basename: ETH,  in the last 168 hours Urinalysis:  Recent Labs Lab 07/12/14 0301  COLORURINE YELLOW  LABSPEC 1.021  PHURINE 6.0  GLUCOSEU >1000*  HGBUR NEGATIVE  BILIRUBINUR NEGATIVE  KETONESUR 40*  PROTEINUR NEGATIVE  UROBILINOGEN 1.0  NITRITE NEGATIVE  LEUKOCYTESUR NEGATIVE   Misc. Labs:   Micro Results: Recent Results (from the past 240 hour(s))  CLOSTRIDIUM DIFFICILE BY PCR     Status: None   Collection Time    07/14/14  3:20 PM      Result Value  Ref Range Status   C difficile by pcr NEGATIVE  NEGATIVE Final   Studies/Results: US Renal  07/15/2014   CLINICAL DATA:  Diabetes.  Hypertension.  EXAM: RENAL/URINARY TRACT ULTRASOUND COMPLETE  COMPARISON:  CT 01/17/2012.  FINDINGS: Right Kidney:  Length: 10.7 cm. Echogenicity within normal limits. No mass or hydronephrosis visualized.  Left Kidney:  Length: 10.7 cm. Echogenicity within normal limits. No mass or hydronephrosis visualized.  Bladder:  Appears normal for degree of bladder distention.  IMPRESSION: Normal exam.   Electronically Signed   By: Marcello Moores  Register   On: 07/15/2014 09:54   Medications: I have reviewed the patient's current medications. Scheduled Meds: . aspirin EC  81 mg Oral Daily  . atorvastatin  80 mg Oral QHS  . citalopram  20 mg Oral Daily  . enoxaparin (LOVENOX) injection  30 mg Subcutaneous Q24H  . feeding supplement (GLUCERNA SHAKE)  237 mL Oral QPC supper  . gabapentin  300 mg Oral QHS  . insulin aspart  0-15 Units Subcutaneous TID WC  . insulin aspart  0-7 Units Subcutaneous QHS  . insulin aspart  6 Units Subcutaneous TID WC  . insulin glargine  30 Units Subcutaneous Daily  . metoCLOPramide  5 mg Oral TID AC  . pantoprazole  40 mg Oral QAC breakfast   Continuous Infusions:  PRN Meds:.acetaminophen, acetaminophen, albuterol, nitroGLYCERIN, ondansetron (ZOFRAN) IV, ondansetron Assessment/Plan: Principal Problem:   Nausea and vomiting Active Problems:   Chronic combined systolic and diastolic heart failure   COPD, moderate   Automatic implantable cardioverter-defibrillator in situ   Type 2 diabetes mellitus not at goal  63 yo female with DM II, HTN, combined CHF comes in with n/v and subsequently having some chest soreness. Has developed AKI in the hospital  AKI- likely ATN from hypotension - was normal 2 days ago but increased rapidly from 0.9 > 2.04 > 4.49 - likely iatrogenic from dropping her BP too much from her baseline. Her BP was high when she  was admitted so we continued all of her home anti-hypertensives. Her BP dropped low yesterday so we stopped lasix and lisinopril, and also reduced her amlodopine. Her BP continues to run low 90's.  - will hold all of her antihypertensive and diuretics : lasix, spironolactone, amlodipine, metoprolol, lisinopril.  - close kidney monitoring - strict I/O - bladder scan normal. - urine sodium, crt, urea pending. - treatment would be supportive care and maintaining euvolemia for the patient.  - will consult nephrology if crt not improving tomorrow.   Hypotension - has hx of uncontrolled HTN. Had been on lisinopril 40mg , metoprolol 200XL, lasix 40 BID, amlodopine 10 daily. -  we initially continued these all but given she was hypotensive yesterday, and continues to be today, eventually d/ced all of these. - will restart slowly if BP goes up given her hypotension and AKI  Nausea/vomitting - - improving with reglan and PPI. Likely gastroparesis from diabetes. This has been going on for a long time. Recurs several times a year. This could be 2/2 diabetic gastroparesis given her uncontrolled diabetes. Other ddx includes gastroenteritis, GERD, esophageal stricture, ulcers. May get Upper GI study outpatient if doesn't get better.  Combined CHF - last echo 03/2014 EF 35-40% with grade 1 dysfunction, mild MR, AR, LA enlargement.  -appears to be euvolemic currenlty, no SOB.  - hold diuretics because of the hypotension.  - cont asa 81mg    DM II - uncontrolled. Last hgba1c 10.9, this time 12.7.  - takes metformin, lantus 44 am, and 8, 12, 10 meal time insulins. CBG here 200-230's. - lantus 30, 6 meal times, and SSI-moderate. Needs outpatient follow up with [facility].   Weight loss - concerning for malignancy but likely from gastroparesis.  - last cscope 2 years ago benign other than 1 polyp. Weight loss could be 2/2 to metformin as patient mentioned  - will do FOBT.   HLD continue atorvastatin 80 mg daily   COPD - cont albuterol nebs PRN  dvt ppx: heparin.  Dispo: Disposition is deferred at this time, awaiting improvement of current medical problems.  Anticipated discharge in approximately 2-3 day(s).   The patient does have a current PCP Wilber Oliphant, MD) and does need an [facility] hospital follow-up appointment after discharge.  The patient does not have transportation limitations that hinder transportation to clinic appointments.  .Services Needed at time of discharge: Y = Yes, Blank = No PT:   OT:   RN:   Equipment:   Other:     LOS: 3 days   Dellia Nims, MD 07/15/2014, 11:19 AM

## 2014-07-15 NOTE — Progress Notes (Signed)
07/15/2014 6:33 PM Nursing note Pt. Has only voided 100 cc of amber, turbid urine this shift. Pt. States she has not voided since this time last pm. Dr. Sherrine Maples paged and made aware of findings. No new orders at this time. Will continue to closely monitor patient.  [name], [name]

## 2014-07-16 DIAGNOSIS — I5040 Unspecified combined systolic (congestive) and diastolic (congestive) heart failure: Secondary | ICD-10-CM

## 2014-07-16 DIAGNOSIS — E785 Hyperlipidemia, unspecified: Secondary | ICD-10-CM

## 2014-07-16 DIAGNOSIS — E1165 Type 2 diabetes mellitus with hyperglycemia: Secondary | ICD-10-CM

## 2014-07-16 DIAGNOSIS — R112 Nausea with vomiting, unspecified: Secondary | ICD-10-CM

## 2014-07-16 DIAGNOSIS — R634 Abnormal weight loss: Secondary | ICD-10-CM

## 2014-07-16 DIAGNOSIS — N179 Acute kidney failure, unspecified: Secondary | ICD-10-CM

## 2014-07-16 DIAGNOSIS — I959 Hypotension, unspecified: Secondary | ICD-10-CM

## 2014-07-16 LAB — BASIC METABOLIC PANEL
Anion gap: 13 (ref 5–15)
BUN: 32 mg/dL — ABNORMAL HIGH (ref 6–23)
CO2: 23 mEq/L (ref 19–32)
Calcium: 8.2 mg/dL — ABNORMAL LOW (ref 8.4–10.5)
Chloride: 100 mEq/L (ref 96–112)
Creatinine, Ser: 2.92 mg/dL — ABNORMAL HIGH (ref 0.50–1.10)
GFR calc Af Amer: 19 mL/min — ABNORMAL LOW (ref >90)
GFR calc non Af Amer: 16 mL/min — ABNORMAL LOW (ref >90)
Glucose, Bld: 110 mg/dL — ABNORMAL HIGH (ref 70–99)
Potassium: 3.5 mEq/L — ABNORMAL LOW (ref 3.7–5.3)
Sodium: 136 mEq/L — ABNORMAL LOW (ref 137–147)

## 2014-07-16 LAB — GLUCOSE, CAPILLARY
Glucose-Capillary: 121 mg/dL — ABNORMAL HIGH (ref 70–99)
Glucose-Capillary: 251 mg/dL — ABNORMAL HIGH (ref 70–99)
Glucose-Capillary: 156 mg/dL — ABNORMAL HIGH (ref 70–99)
Glucose-Capillary: 120 mg/dL — ABNORMAL HIGH (ref 70–99)

## 2014-07-16 LAB — UREA NITROGEN, URINE: Urea Nitrogen, Ur: 295 mg/dL

## 2014-07-16 MED ORDER — POTASSIUM CHLORIDE CRYS ER 20 MEQ PO TBCR
40.0000 meq | EXTENDED_RELEASE_TABLET | Freq: Every day | ORAL | Status: DC
  Administered 2014-07-16 – 2014-07-18 (×3): 40 meq via ORAL
  Filled 2014-07-16 (×3): qty 2

## 2014-07-16 MED ORDER — SODIUM CHLORIDE 0.9 % IV SOLN
INTRAVENOUS | Status: AC
  Administered 2014-07-16 – 2014-07-18 (×5): via INTRAVENOUS

## 2014-07-16 NOTE — Progress Notes (Signed)
Inpatient Diabetes Program Recommendations  AACE/ADA: New Consensus Statement on Inpatient Glycemic Control (2013)  Target Ranges:  Prepandial:   less than 140 mg/dL      Peak postprandial:   less than 180 mg/dL (1-2 hours)      Critically ill patients:  140 - 180 mg/dL   Results for MCKENZEY, STRAWDERMAN (MRN [id number]) as of 07/16/2014 13:41  Ref. Range 07/15/2014 06:09 07/15/2014 11:08 07/15/2014 16:13 07/15/2014 21:00 07/16/2014 06:22 07/16/2014 12:00  Glucose-Capillary Latest Range: 70-99 mg/dL 184 (H) 209 (H) 225 (H) 225 (H) 121 (H) 251 (H)   Current orders for Inpatient glycemic control: Lantus 30 units daily, Novolog 6 units TID with meals, Novolog 0-15 units TID with meals, Novolog 0-7 units QHS  Inpatient Diabetes Program Recommendations Insulin - Meal Coverage: Please consider increasing Novolog meal coverage to Novolog 10 units TID with meals.  Thanks, Barnie Alderman, RN, MSN, CCRN Diabetes Coordinator Inpatient Diabetes Program [phone] (Team Pager) [phone] (AP office) [phone] [facility] office)

## 2014-07-16 NOTE — Progress Notes (Signed)
Came to visit patient at bedside to offer and explain [facility] Care Management services. Patient endorses she goes to Internal Medicine Clinic as Primary Care. Consents obtained. She will receive post hospital discharge call and will be evaluated for monthly home visits. Confirmed best contact information. [facility] Care Management packet left at bedside. Made inpatient RNCM aware [facility] to follow post discharge.  Marthenia Rolling, MSN- [facility] [phone]

## 2014-07-16 NOTE — Progress Notes (Signed)
Subjective: Doing well. No nausea or vomitting. Had 2 solid BM's without diarrhea or bleeding. Tolerating food. States peeing well without problem. UOP 700. Had some urinary retention on bladder scan post voidal.   Objective: Vital signs in last 24 hours: Filed Vitals:   07/15/14 1933 07/15/14 2349 07/16/14 0400 07/16/14 1003  BP: 110/61 105/59 105/61 101/52  Pulse: 89 79 81 82  Temp: 97.7 F (36.5 C)  98.4 F (36.9 C)   TempSrc: Oral  Oral   Resp: 18  18   Height:      Weight:      SpO2: 96%  100%    Weight change:   Intake/Output Summary (Last 24 hours) at 07/16/14 1123 Last data filed at 07/16/14 0900  Gross per 24 hour  Intake    776 ml  Output   1000 ml  Net   -224 ml   Vitals reviewed. General: resting in bed, NAD.  HEENT: PERRL, EOMI, no scleral icterus Cardiac: RRR, no rubs, murmurs or gallops Pulm: clear to auscultation bilaterally, no wheezes, rales, or rhonchi Abd: soft, some TTP on epigastric region, nondistended, BS present Ext: warm and well perfused, no pedal edema Neuro: alert and oriented X3, cranial nerves II-XII grossly intact, strength and sensation to light touch equal in bilateral upper and lower extremities  Lab Results: Basic Metabolic Panel:  Recent Labs Lab 07/15/14 1235 07/16/14 0500  NA 135* 136*  K 4.3 3.5*  CL 97 100  CO2 20 23  GLUCOSE 161* 110*  BUN 31* 32*  CREATININE 4.55* 2.92*  CALCIUM 8.5 8.2*   Liver Function Tests:  Recent Labs Lab 07/12/14 0140 07/15/14 1235  AST 32 23  ALT 17 15  ALKPHOS 117 108  BILITOT 0.7 0.4  PROT 7.5 7.4  ALBUMIN 3.8 3.6    Recent Labs Lab 07/12/14 0140  LIPASE 35   No results found for this basename: AMMONIA,  in the last 168 hours CBC:  Recent Labs Lab 07/12/14 0140 07/15/14 0845  WBC 8.6 10.4  NEUTROABS 6.7 6.4  HGB 14.7 14.2  HCT 44.2 44.1  MCV 85.3 84.5  PLT 183 186   Cardiac Enzymes:  Recent Labs Lab 07/12/14 0140 07/12/14 1855  TROPONINI <0.30 <0.30    BNP:  Recent Labs Lab 07/12/14 0526  PROBNP 718.4*   D-Dimer: No results found for this basename: DDIMER,  in the last 168 hours CBG:  Recent Labs Lab 07/14/14 2055 07/15/14 0609 07/15/14 1108 07/15/14 1613 07/15/14 2100 07/16/14 0622  GLUCAP 229* 184* 209* 225* 225* 121*   Hemoglobin A1C:  Recent Labs Lab 07/12/14 1855  HGBA1C 12.7*   Fasting Lipid Panel: No results found for this basename: CHOL, HDL, LDLCALC, TRIG, CHOLHDL, LDLDIRECT,  in the last 168 hours Thyroid Function Tests: No results found for this basename: TSH, T4TOTAL, FREET4, T3FREE, THYROIDAB,  in the last 168 hours Coagulation: No results found for this basename: LABPROT, INR,  in the last 168 hours Anemia Panel: No results found for this basename: VITAMINB12, FOLATE, FERRITIN, TIBC, IRON, RETICCTPCT,  in the last 168 hours Urine Drug Screen: Drugs of Abuse     Component Value Date/Time   LABOPIA NONE DETECTED 05/15/2014 1141   COCAINSCRNUR NONE DETECTED 05/15/2014 1141   LABBENZ NONE DETECTED 05/15/2014 1141   AMPHETMU NONE DETECTED 05/15/2014 1141   THCU NONE DETECTED 05/15/2014 1141   LABBARB NONE DETECTED 05/15/2014 1141    Alcohol Level: No results found for this basename: ETH,  in the  last 168 hours Urinalysis:  Recent Labs Lab 07/12/14 0301 07/15/14 1759  COLORURINE YELLOW AMBER*  LABSPEC 1.021 1.022  PHURINE 6.0 5.0  GLUCOSEU >1000* 500*  HGBUR NEGATIVE NEGATIVE  BILIRUBINUR NEGATIVE MODERATE*  KETONESUR 40* 15*  PROTEINUR NEGATIVE 30*  UROBILINOGEN 1.0 1.0  NITRITE NEGATIVE NEGATIVE  LEUKOCYTESUR NEGATIVE NEGATIVE   Misc. Labs:   Micro Results: Recent Results (from the past 240 hour(s))  CLOSTRIDIUM DIFFICILE BY PCR     Status: None   Collection Time    07/14/14  3:20 PM      Result Value Ref Range Status   C difficile by pcr NEGATIVE  NEGATIVE Final   Studies/Results: US Renal  07/15/2014   CLINICAL DATA:  Diabetes.  Hypertension.  EXAM: RENAL/URINARY TRACT  ULTRASOUND COMPLETE  COMPARISON:  CT 01/17/2012.  FINDINGS: Right Kidney:  Length: 10.7 cm. Echogenicity within normal limits. No mass or hydronephrosis visualized.  Left Kidney:  Length: 10.7 cm. Echogenicity within normal limits. No mass or hydronephrosis visualized.  Bladder:  Appears normal for degree of bladder distention.  IMPRESSION: Normal exam.   Electronically Signed   By: Marcello Moores  Register   On: 07/15/2014 09:54   Medications: I have reviewed the patient's current medications. Scheduled Meds: . aspirin EC  81 mg Oral Daily  . atorvastatin  80 mg Oral QHS  . citalopram  20 mg Oral Daily  . enoxaparin (LOVENOX) injection  30 mg Subcutaneous Q24H  . feeding supplement (GLUCERNA SHAKE)  237 mL Oral QPC supper  . gabapentin  300 mg Oral QHS  . insulin aspart  0-15 Units Subcutaneous TID WC  . insulin aspart  0-7 Units Subcutaneous QHS  . insulin aspart  6 Units Subcutaneous TID WC  . insulin glargine  30 Units Subcutaneous Daily  . metoCLOPramide  5 mg Oral TID AC  . pantoprazole  40 mg Oral QAC breakfast  . potassium chloride  40 mEq Oral Daily   Continuous Infusions: . sodium chloride 75 mL/hr at 07/16/14 0743   PRN Meds:.acetaminophen, acetaminophen, albuterol, nitroGLYCERIN, ondansetron (ZOFRAN) IV, ondansetron Assessment/Plan: Principal Problem:   Nausea and vomiting Active Problems:   Chronic combined systolic and diastolic heart failure   COPD, moderate   Automatic implantable cardioverter-defibrillator in situ   Type 2 diabetes mellitus not at goal   ATN (acute tubular necrosis)  63 yo female with DM II, HTN, combined CHF comes in with n/v and subsequently having some chest soreness. Has developed AKI in the hospital  AKI- likely ATN from hypotension - was normal on admission ago but increased rapidly from 0.9 > 2.04 > 4.49 - likely iatrogenic from dropping her BP too much from her baseline. Her BP was high when she was admitted so we continued all of her home  anti-hypertensives. Concern of medicine noncompliance since her HR in the past has been >100 on max dose Bblocker, and also her Hgba1c increasing over last 3 months. We started her meds on admission which she reported to [place] to be taking regularly. We 2nd day of hosp we stopped lasix and lisinopril. We have stopped all of her medicines yesterday as her BP was running low 90's. BP improved slightly today to 100.   Crt improved from 4.55 to 2.92 with supportive therapy for ATN.  - will hold all of her antihypertensive and diuretics : lasix, spironolactone, amlodipine, metoprolol, lisinopril.  - close kidney function monitoring - strict I/O - is at risk of overdiuresis once ATN resolves.  - bladder scan  normal. May have some urinary retention from neurogenic bladder 2/2 to diabetes. Will monitor her urine output.  - treatment would be supportive care and maintaining euvolemia for the patient.  - will consult nephrology if crt doesn't continue to improve.   Hypotension - has hx of uncontrolled HTN. Had been on lisinopril 40mg , metoprolol 200XL, lasix 40 BID, amlodopine 10 daily. - we initially continued these all but given she was hypotensive for last 2 days, dc/ed all of these.  - will restart slowly if BP goes up given her hypotension and AKI. Likely will start with AceI if Kidney function improves and Beta blocker for her CHF to reduce mortality.   Nausea/vomitting - - improving with reglan and PPI. Likely gastroparesis from diabetes. This has been going on for a long time. Recurs several times a year. This could be 2/2 diabetic gastroparesis given her uncontrolled diabetes. Other ddx includes gastroenteritis, GERD, esophageal stricture, ulcers. May get Upper GI study outpatient if doesn't get better.  Combined CHF - last echo 03/2014 EF 35-40% with grade 1 dysfunction, mild MR, AR, LA enlargement.  -appears to be euvolemic currenlty, no SOB.  - hold diuretics because of the hypotension.  - cont  asa 81mg    DM II - uncontrolled. Last hgba1c 10.9, this time 12.7.  - takes metformin, lantus 44 am, and 8, 12, 10 meal time insulins. CBG here 200-230's. - lantus 30, 6 meal times, and SSI-moderate. Needs outpatient follow up with [facility]   Weight loss - concerning for malignancy but likely from gastroparesis.  - last cscope 2 years ago benign other than 1 polyp. Weight loss could be 2/2 to metformin as patient mentioned  - will do FOBT.   HLD continue atorvastatin 80 mg daily  COPD - cont albuterol nebs PRN  dvt ppx: heparin.  Dispo: Disposition is deferred at this time, awaiting improvement of current medical problems.  Anticipated discharge in approximately 2-3 day(s).   The patient does have a current PCP Wilber Oliphant, MD) and does need an [facility] hospital follow-up appointment after discharge.  The patient does not have transportation limitations that hinder transportation to clinic appointments.  .Services Needed at time of discharge: Y = Yes, Blank = No PT:   OT:   RN:   Equipment:   Other:     LOS: 4 days   Dellia Nims, MD 07/16/2014, 11:23 AM

## 2014-07-16 NOTE — Progress Notes (Signed)
Internal Medicine Attending  Date: 07/16/2014  Patient name: [name] Medical record number: [id number] Date of birth: December 21, 1950 Age: 63 y.o. Gender: female  I saw and evaluated the patient. I reviewed the resident's note by Dr. Genene Churn and I agree with the resident's findings and plans as documented in his progress note.  Her renal function is improving this morning. Her creatinine is 2.92 down from 4.55. Her BUN is stable and her anion gap has closed as her acute renal failure presumably secondary to a hypotensive acute tubular necrosis has begun to resolve. Fortunately, she does not have hyperkalemia with the acute renal failure.  We have stopped all of her antihypertensive medications and will begin to reintroduce them one at a time, likely starting tomorrow if her kidney function continues to improve. We will choose those that are more likely to benefit her in multiple ways, for example by treating both her hypertension and preventing diabetic nephrosclerosis or improving her heart failure outcomes.   Because of our concerns that she is not accurately reporting her degree of medication compliance, we have asked [facility] to make home visits to educate her on how to fill a pillbox and be more consistent in her medication use. This will hopefully improve the control of her chronic medical conditions and avoid any confusion that may lead to escalation of pharmacotherapy in the setting of incomplete information as to medication compliance.   Along these lines, I am concerned she may have early neurogenic bladder issues given her post void urinary retention. The cause of this is likely a diabetic autonomic insufficiency similar to her diabetic gastroparesis. I appreciate the input from the diabetes coordinator and we will increase the NovoLog to cover meals and improve her diabetic control.   We will also pay very close attention to her urine output keeping in mind that if we do not replete her post ATN  fluid output we may delay resolution of her renal dysfunction. If she is able to keep herself well-hydrated and her kidney function continues to improve over the next 24 hours I anticipate she will be ready for discharge in the morning with outpatient followup to further adjust her medications for her acute renal failure and for more long-term control of her chronic medical conditions.

## 2014-07-17 LAB — BASIC METABOLIC PANEL
Anion gap: 10 (ref 5–15)
BUN: 23 mg/dL (ref 6–23)
CO2: 26 mEq/L (ref 19–32)
Calcium: 9 mg/dL (ref 8.4–10.5)
Chloride: 108 mEq/L (ref 96–112)
Creatinine, Ser: 1.3 mg/dL — ABNORMAL HIGH (ref 0.50–1.10)
GFR calc Af Amer: 51 mL/min — ABNORMAL LOW (ref >90)
GFR calc non Af Amer: 44 mL/min — ABNORMAL LOW (ref >90)
Glucose, Bld: 105 mg/dL — ABNORMAL HIGH (ref 70–99)
Potassium: 4.3 mEq/L (ref 3.7–5.3)
Sodium: 144 mEq/L (ref 137–147)

## 2014-07-17 LAB — GLUCOSE, CAPILLARY
Glucose-Capillary: 122 mg/dL — ABNORMAL HIGH (ref 70–99)
Glucose-Capillary: 193 mg/dL — ABNORMAL HIGH (ref 70–99)
Glucose-Capillary: 162 mg/dL — ABNORMAL HIGH (ref 70–99)
Glucose-Capillary: 201 mg/dL — ABNORMAL HIGH (ref 70–99)

## 2014-07-17 MED ORDER — INSULIN ASPART 100 UNIT/ML ~~LOC~~ SOLN: 10.0000 [IU] | Freq: Three times a day (TID) | SUBCUTANEOUS | Status: DC

## 2014-07-17 NOTE — Care Management Note (Signed)
    Page 1 of 1   07/17/2014     12:48:03 PM CARE MANAGEMENT NOTE 07/17/2014  Patient:  [name], [name]   Account Number:  [id number]  Date Initiated:  07/17/2014  Documentation initiated by:  [facility]  Subjective/Objective Assessment:   adm: recurrent episode of nausea and vomiting     Action/Plan:   came from home and going back home   Anticipated DC Date:  07/17/2014   Anticipated DC Plan:  HOME/SELF CARE         Choice offered to / List presented to:             Status of service:  Completed, signed off Medicare Important Message given?   (If response is "NO", the following Medicare IM given date fields will be blank) Date Medicare IM given:   Medicare IM given by:   Date Additional Medicare IM given:   Additional Medicare IM given by:    Discharge Disposition:  HOME/SELF CARE  Per UR Regulation:    If discussed at Long Length of Stay Meetings, dates discussed:    Comments:  07/17/14 12:45 CM notes no HH services are orderd but pt will be seen by [facility] for post hospital visit and be evaluated for monthly visits.  No other CM needs were communicated.  Mariane Masters, BSN, CM [phone].

## 2014-07-17 NOTE — Progress Notes (Signed)
Subjective: Doing well. No nausea or vomitting. No complaints. Diuresed 3850 Urine last 24 hours.  Objective: Vital signs in last 24 hours: Filed Vitals:   07/16/14 2007 07/17/14 0035 07/17/14 0401 07/17/14 1455  BP: 141/47 130/55 134/59 157/67  Pulse: 79 75 80 76  Temp: 98.6 F (37 C) 98 F (36.7 C) 98.3 F (36.8 C) 98.4 F (36.9 C)  TempSrc: Oral Oral Oral Oral  Resp: 18 18 18 18   Height:      Weight:   103.103 kg (227 lb 4.8 oz)   SpO2: 100% 97% 100% 100%   Weight change:   Intake/Output Summary (Last 24 hours) at 07/17/14 1629 Last data filed at 07/17/14 0600  Gross per 24 hour  Intake 2076.25 ml  Output   3150 ml  Net -1073.75 ml   Vitals reviewed. General: resting in bed, NAD.  HEENT: PERRL, EOMI, no scleral icterus Cardiac: RRR, no rubs, murmurs or gallops Pulm: clear to auscultation bilaterally, no wheezes, rales, or rhonchi Abd: soft, some TTP on epigastric region, nondistended, BS present Ext: warm and well perfused, no pedal edema Neuro: alert and oriented X3, cranial nerves II-XII grossly intact, strength and sensation to light touch equal in bilateral upper and lower extremities  Lab Results: Basic Metabolic Panel:  Recent Labs Lab 07/16/14 0500 07/17/14 0436  NA 136* 144  K 3.5* 4.3  CL 100 108  CO2 23 26  GLUCOSE 110* 105*  BUN 32* 23  CREATININE 2.92* 1.30*  CALCIUM 8.2* 9.0   Liver Function Tests:  Recent Labs Lab 07/12/14 0140 07/15/14 1235  AST 32 23  ALT 17 15  ALKPHOS 117 108  BILITOT 0.7 0.4  PROT 7.5 7.4  ALBUMIN 3.8 3.6    Recent Labs Lab 07/12/14 0140  LIPASE 35   No results found for this basename: AMMONIA,  in the last 168 hours CBC:  Recent Labs Lab 07/12/14 0140 07/15/14 0845  WBC 8.6 10.4  NEUTROABS 6.7 6.4  HGB 14.7 14.2  HCT 44.2 44.1  MCV 85.3 84.5  PLT 183 186   Cardiac Enzymes:  Recent Labs Lab 07/12/14 0140 07/12/14 1855  TROPONINI <0.30 <0.30   BNP:  Recent Labs Lab  07/12/14 0526  PROBNP 718.4*   D-Dimer: No results found for this basename: DDIMER,  in the last 168 hours CBG:  Recent Labs Lab 07/16/14 0622 07/16/14 1200 07/16/14 1625 07/16/14 2145 07/17/14 0622 07/17/14 1133  GLUCAP 121* 251* 156* 120* 122* 193*   Hemoglobin A1C:  Recent Labs Lab 07/12/14 1855  HGBA1C 12.7*   Fasting Lipid Panel: No results found for this basename: CHOL, HDL, LDLCALC, TRIG, CHOLHDL, LDLDIRECT,  in the last 168 hours Thyroid Function Tests: No results found for this basename: TSH, T4TOTAL, FREET4, T3FREE, THYROIDAB,  in the last 168 hours Coagulation: No results found for this basename: LABPROT, INR,  in the last 168 hours Anemia Panel: No results found for this basename: VITAMINB12, FOLATE, FERRITIN, TIBC, IRON, RETICCTPCT,  in the last 168 hours Urine Drug Screen: Drugs of Abuse     Component Value Date/Time   LABOPIA NONE DETECTED 05/15/2014 1141   COCAINSCRNUR NONE DETECTED 05/15/2014 1141   LABBENZ NONE DETECTED 05/15/2014 1141   AMPHETMU NONE DETECTED 05/15/2014 1141   THCU NONE DETECTED 05/15/2014 1141   LABBARB NONE DETECTED 05/15/2014 1141    Alcohol Level: No results found for this basename: ETH,  in the last 168 hours Urinalysis:  Recent Labs Lab 07/12/14 0301 07/15/14 1759  COLORURINE YELLOW AMBER*  LABSPEC 1.021 1.022  PHURINE 6.0 5.0  GLUCOSEU >1000* 500*  HGBUR NEGATIVE NEGATIVE  BILIRUBINUR NEGATIVE MODERATE*  KETONESUR 40* 15*  PROTEINUR NEGATIVE 30*  UROBILINOGEN 1.0 1.0  NITRITE NEGATIVE NEGATIVE  LEUKOCYTESUR NEGATIVE NEGATIVE   Misc. Labs:   Micro Results: Recent Results (from the past 240 hour(s))  CLOSTRIDIUM DIFFICILE BY PCR     Status: None   Collection Time    07/14/14  3:20 PM      Result Value Ref Range Status   C difficile by pcr NEGATIVE  NEGATIVE Final   Studies/Results: No results found. Medications: I have reviewed the patient's current medications. Scheduled Meds: . aspirin EC  81 mg Oral  Daily  . atorvastatin  80 mg Oral QHS  . citalopram  20 mg Oral Daily  . enoxaparin (LOVENOX) injection  30 mg Subcutaneous Q24H  . feeding supplement (GLUCERNA SHAKE)  237 mL Oral QPC supper  . gabapentin  300 mg Oral QHS  . insulin aspart  0-15 Units Subcutaneous TID WC  . insulin aspart  0-7 Units Subcutaneous QHS  . insulin aspart  10 Units Subcutaneous TID WC  . insulin glargine  30 Units Subcutaneous Daily  . metoCLOPramide  5 mg Oral TID AC  . pantoprazole  40 mg Oral QAC breakfast  . potassium chloride  40 mEq Oral Daily   Continuous Infusions: . sodium chloride 125 mL/hr at 07/17/14 1059   PRN Meds:.acetaminophen, acetaminophen, albuterol, nitroGLYCERIN, ondansetron (ZOFRAN) IV, ondansetron Assessment/Plan: Principal Problem:   Nausea and vomiting Active Problems:   Chronic combined systolic and diastolic heart failure   COPD, moderate   Automatic implantable cardioverter-defibrillator in situ   Type 2 diabetes mellitus not at goal   ATN (acute tubular necrosis)  63 yo female with DM II, HTN, combined CHF comes in with n/v and subsequently having some chest soreness. Has developed AKI in the hospital   AKI- likely ATN from hypotension - was normal on admission but increased rapidly from 0.9 > 2.04 > 4.49  - likely iatrogenic from dropping her BP too much from her baseline. Her BP was high when she was admitted so we continued all of her home anti-hypertensives. Concern of medicine noncompliance since her HR in the past has been >100 on max dose Bblocker, and also her Hgba1c increasing over last 3 months. We started her meds on admission which she reported to [place] to be taking regularly. We 2nd day of hosp we stopped lasix and lisinopril. We have stopped all of her medicines on 3rd day as her BP was running low 90's. BP improved now running 140-150's.  Crt improved from 4.55 to 1.3 with supportive therapy for ATN. - held all of her antihypertensive and diuretics : lasix,  spironolactone, amlodipine, metoprolol, lisinopril. Should start these back slowly one at a time at hospital follow up. Hypotension - has hx of uncontrolled HTN. Had been on lisinopril 40mg , metoprolol 200XL, lasix 40 BID, amlodopine 10 daily.   - we initially continued these all but given she was hypotensive for last 2 days, dc/ed all of these.  - will restart slowly. Consulted [facility] to help her with medications at home. Nausea/vomitting - - improved with reglan and PPI. Likely gastroparesis from diabetes.  This has been going on for a long time. Recurs several times a year. This could be 2/2 diabetic gastroparesis given her uncontrolled diabetes. Other ddx includes gastroenteritis, GERD, esophageal stricture, ulcers. May get Upper GI study outpatient if  doesn't get better.  Combined CHF - last echo 03/2014 EF 35-40% with grade 1 dysfunction, mild MR, AR, LA enlargement.  -appears to be euvolemic currenlty, no SOB.  - cont asa 81mg   DM II - uncontrolled. Last hgba1c 10.9, this time 12.7.  - takes metformin, lantus 44 am, and 8, 12, 10 meal time insulins. CBG here 200-230's.  - lantus 30, 6 meal times, and SSI-moderate. Needs outpatient follow up with [facility]. Will send her with her home regimen. Weight loss - concerning for malignancy but likely from gastroparesis.  - last cscope 2 years ago benign other than 1 polyp. Weight loss could be 2/2 to metformin as patient mentioned  HLD continue atorvastatin 80 mg daily  COPD - cont albuterol nebs PRN Dispo: Disposition is deferred at this time, awaiting improvement of current medical problems.  Anticipated discharge in approximately 2-3 day(s).   The patient does have a current PCP Wilber Oliphant, MD) and does need an [facility] hospital follow-up appointment after discharge.  The patient does not have transportation limitations that hinder transportation to clinic appointments.  .Services Needed at time of discharge: Y = Yes, Blank = No PT:   OT:    RN:   Equipment:   Other:     LOS: 5 days   Dellia Nims, MD 07/17/2014, 4:29 PM

## 2014-07-17 NOTE — Progress Notes (Signed)
Patient ID: [name], female   DOB: 30-Jun-1951, 63 y.o.   MRN: [id number] Medicine attending discharge note: I personally interviewed and examined this patient today together with resident physician Dr. Dellia Nims and I concur with his evaluation and discharge plan.  63 year old insulin-dependent, hypertensive, diabetic with chronic congestive heart failure, status post implanted defibrillator, who presented on the day of the current admission with a recurrent episode of nausea and vomiting. Blood glucose was not under good control. Hemoglobin A1c 12.7 compared with 10.9 back in July. Regular abdominal x-rays unremarkable. Abdominal exam really unremarkable except for some minimal epigastric tenderness. She had reported some weight loss. She had no history of ulcer disease or esophagitis. She was not anemic. Possiblilty considered that her GI symptoms were secondary to gastroparesis related to her long-standing and poorly controlled diabetes. She was started on a trial of Reglan with good results and cessation of her nausea and vomiting. As the discharge planning was in progress, she was started back on her antihypertensive medications in full doses. This resulted in a acute fall in her blood pressure. There was a sudden decline in her renal function with rise in creatinine to a peak value of 4.5. Antihypertensives were discontinued. She was treated conservatively with gentle hydration. Her creatinine rapidly improved and was 1.3 on day of discharge. We suspect that she has been poorly compliant with her medications at home and that she really was not taking the full doses of antihypertensives that were recorded on admission. We emphasized the importance of medical compliance. We consulted the [facility] team to see if she would qualify for the patient centered home care program and she does. We will try to monitor medication compliance following discharge. There were no other  complications.  Discharge diagnoses: #1. Recurrent nausea and vomiting likely secondary to diabetic gastroparesis #2. Transient renal function/acute tubular necrosis secondary to hypotension #3. Insulin-dependent diabetes-poorly controlled #4. Essential hypertension with associated combined diastolic and systolic heart failure status post AICD implant #5. Hyperlipidemia #6. Suspected noncompliance with medication  Plan: See resident physician discharge summary  Murriel Hopper, M.D., [facility]

## 2014-07-18 ENCOUNTER — Ambulatory Visit: Payer: Commercial Managed Care - HMO | Admitting: Internal Medicine

## 2014-07-18 DIAGNOSIS — K3184 Gastroparesis: Secondary | ICD-10-CM

## 2014-07-18 DIAGNOSIS — I10 Essential (primary) hypertension: Secondary | ICD-10-CM

## 2014-07-18 DIAGNOSIS — J449 Chronic obstructive pulmonary disease, unspecified: Secondary | ICD-10-CM

## 2014-07-18 DIAGNOSIS — E1143 Type 2 diabetes mellitus with diabetic autonomic (poly)neuropathy: Principal | ICD-10-CM

## 2014-07-18 LAB — BASIC METABOLIC PANEL
Anion gap: 11 (ref 5–15)
BUN: 16 mg/dL (ref 6–23)
CO2: 26 mEq/L (ref 19–32)
Calcium: 8.9 mg/dL (ref 8.4–10.5)
Chloride: 107 mEq/L (ref 96–112)
Creatinine, Ser: 0.94 mg/dL (ref 0.50–1.10)
GFR calc Af Amer: 75 mL/min — ABNORMAL LOW (ref >90)
GFR calc non Af Amer: 65 mL/min — ABNORMAL LOW (ref >90)
Glucose, Bld: 99 mg/dL (ref 70–99)
Potassium: 4.2 mEq/L (ref 3.7–5.3)
Sodium: 144 mEq/L (ref 137–147)

## 2014-07-18 LAB — GLUCOSE, CAPILLARY
Glucose-Capillary: 105 mg/dL — ABNORMAL HIGH (ref 70–99)
Glucose-Capillary: 164 mg/dL — ABNORMAL HIGH (ref 70–99)

## 2014-07-18 MED ORDER — METOPROLOL SUCCINATE ER 50 MG PO TB24
50.0000 mg | ORAL_TABLET | Freq: Every day | ORAL | Status: DC
  Administered 2014-07-18: 50 mg via ORAL
  Filled 2014-07-18: qty 1

## 2014-07-18 MED ORDER — LISINOPRIL 10 MG PO TABS: 10.0000 mg | ORAL_TABLET | Freq: Every day | ORAL | Status: DC

## 2014-07-18 MED ORDER — METOPROLOL SUCCINATE ER 50 MG PO TB24: 50.0000 mg | ORAL_TABLET | Freq: Every day | ORAL | Status: DC

## 2014-07-18 MED ORDER — INSULIN ASPART 100 UNIT/ML ~~LOC~~ SOLN
6.0000 [IU] | Freq: Three times a day (TID) | SUBCUTANEOUS | Status: DC
  Administered 2014-07-18: 12:00:00 via SUBCUTANEOUS

## 2014-07-18 NOTE — Progress Notes (Signed)
CARE MANAGEMENT NOTE 07/18/2014  Patient:  [name], [name]   Account Number:  [id number]  Date Initiated:  07/17/2014  Documentation initiated by:  [facility]  Subjective/Objective Assessment:   adm: recurrent episode of nausea and vomiting     Action/Plan:   came from home and going back home   Anticipated DC Date:  07/17/2014   Anticipated DC Plan:  [place]  CM consult      Choice offered to / List presented to:             Status of service:  Completed, signed off Medicare Important Message given?  YES (If response is "NO", the following Medicare IM given date fields will be blank) Date Medicare IM given:  07/18/2014 Medicare IM given by:  [facility] Date Additional Medicare IM given:   Additional Medicare IM given by:    Discharge Disposition:  HOME/SELF CARE  Per UR Regulation:  Reviewed for med. necessity/level of care/duration of stay  If discussed at [place] of Stay Meetings, dates discussed:    Comments:  07/18/2014 1030 NCM spoke to pt and she lives alone but dtr, Cathie Olden # [phone]. Pt states dtr is a Therapist, sports and able to assist her at home with needs. Pt will be followed by [facility]. No DME needed. Jonnie Finner RN CCM Case Mgmt phone [phone]  07/17/14 12:45 CM notes no HH services are orderd but pt will be seen by [facility] for post hospital visit and be evaluated for monthly visits.  No other CM needs were communicated.  Mariane Masters, BSN, CM [phone].

## 2014-07-18 NOTE — Progress Notes (Signed)
Pt discharged home Discharge instructions given & reviewed Eduction discussed  IV dc'd  Tele dc'd  Pt discharged via wheelchair with all pt belongs at side.  Sherrie Mustache 12:51 PM

## 2014-07-18 NOTE — Progress Notes (Signed)
  Date: 07/18/2014  Patient name: [name]  Medical record number: [id number]  Date of birth: 25-Mar-1951   This patient has been seen and the plan of care was discussed with the house staff. Please see their note for complete details. I concur with their findings with the following additions/corrections:  Patient to be discharged today on a more conservative regimen for her blood pressure.  She will follow closely with the clinic.   Sid Falcon, MD 07/18/2014, 2:32 PM

## 2014-07-18 NOTE — Progress Notes (Signed)
Subjective: No complaints to report this morning. She was eating breakfast this morning and feels markedly better.  Objective: Vital signs in last 24 hours: Filed Vitals:   07/17/14 2145 07/18/14 0025 07/18/14 0420 07/18/14 0513  BP: 184/85 141/64 158/74 164/71  Pulse: 72 79 71 73  Temp: 98.3 F (36.8 C) 98 F (36.7 C) 98.1 F (36.7 C) 98.7 F (37.1 C)  TempSrc: Oral Oral Oral Oral  Resp: 18 18 18 16   Height:      Weight:   227 lb 6.4 oz (103.148 kg)   SpO2: 100% 97% 97% 97%   Weight change: 1.6 oz (0.045 kg)  Intake/Output Summary (Last 24 hours) at 07/18/14 0727 Last data filed at 07/18/14 0500  Gross per 24 hour  Intake    240 ml  Output   2400 ml  Net  -2160 ml   Vitals reviewed. General: resting in bed, NAD.  HEENT: PERRL, EOMI, no scleral icterus Cardiac: RRR, no rubs, murmurs or gallops Pulm: clear to auscultation bilaterally, no wheezes, rales, or rhonchi Abd: soft, no tenderness to palpation, nondistended, BS present Ext: warm and well perfused, no pedal edema Neuro: alert and oriented X3, cranial nerves II-XII grossly intact, strength and sensation to light touch equal in bilateral upper and lower extremities  Lab Results: Basic Metabolic Panel:  Recent Labs Lab 07/17/14 0436 07/18/14 0407  NA 144 144  K 4.3 4.2  CL 108 107  CO2 26 26  GLUCOSE 105* 99  BUN 23 16  CREATININE 1.30* 0.94  CALCIUM 9.0 8.9   Liver Function Tests:  Recent Labs Lab 07/12/14 0140 07/15/14 1235  AST 32 23  ALT 17 15  ALKPHOS 117 108  BILITOT 0.7 0.4  PROT 7.5 7.4  ALBUMIN 3.8 3.6   CBG:  Recent Labs Lab 07/16/14 2145 07/17/14 0622 07/17/14 1133 07/17/14 1623 07/17/14 2215 07/18/14 0554  GLUCAP 120* 122* 193* 162* 201* 105*   Medications: I have reviewed the patient's current medications. Scheduled Meds: . aspirin EC  81 mg Oral Daily  . atorvastatin  80 mg Oral QHS  . citalopram  20 mg Oral Daily  . enoxaparin (LOVENOX) injection  30 mg  Subcutaneous Q24H  . feeding supplement (GLUCERNA SHAKE)  237 mL Oral QPC supper  . gabapentin  300 mg Oral QHS  . insulin aspart  0-15 Units Subcutaneous TID WC  . insulin aspart  0-7 Units Subcutaneous QHS  . insulin aspart  10 Units Subcutaneous TID WC  . insulin glargine  30 Units Subcutaneous Daily  . metoCLOPramide  5 mg Oral TID AC  . pantoprazole  40 mg Oral QAC breakfast  . potassium chloride  40 mEq Oral Daily   Continuous Infusions: . sodium chloride 125 mL/hr at 07/18/14 0108   PRN Meds:.acetaminophen, acetaminophen, albuterol, nitroGLYCERIN, ondansetron (ZOFRAN) IV, ondansetron Assessment/Plan: [name] is a 63- year old female with DM II, HTN, combined systolic/diastolic CHF hospitalized for likely diabetic gastroparesis and later developed AKI.  AKI: Likely 2/2 hypotension from resuming her four-drug regiment. Crt today is back to 0.9 (baseline). BP trending 140-180/60-70 this AM.  -Counseled her about holding her home meds and resuming lisinopril 10mg  given CHF & DM2 as well as metoprolol 50mg  given CHF -Scheduled to follow-up in clinic on Monday for further adjustments  Diabetic gastroparesis: Continue Reglan 5mg  TID for 14 days and reassess -Schedule upper GI study outpatient if symptoms return  Combined CHF: last echo 03/2014 EF 35-40% with grade 1 dysfunction, mild  MR, AR, LA enlargement.  -Resume medications as noted above -Continue ASA 81mg    DM II: A1c 12.7 on admission. She will resume home regiment with further titration in clinic on Monday and likely referral to diabetic educator.   Weight loss: Wt 236 lbs today, down from 242 lbs 2 years ago. May be related to metformin and/or gastroparesis.   HLD: Continue home meds.  COPD: Continue home meds.  Dispo: Disposition is deferred at this time, awaiting improvement of current medical problems.  Anticipated discharge in approximately 1 day(s).   The patient does have a current PCP Wilber Oliphant, MD) and  does need an [facility] hospital follow-up appointment after discharge.  The patient does not have transportation limitations that hinder transportation to clinic appointments.  .Services Needed at time of discharge: Y = Yes, Blank = No PT:   OT:   RN:   Equipment:   Other:     LOS: 6 days   Peggy Rakes, MD 07/18/2014, 7:27 AM

## 2014-07-20 NOTE — Discharge Summary (Signed)
Medicine Attending: I personally examine this patient on the day prior to planned discharge and I concur with assessment and discharge plan as recorded by resident physician Dr Charlott Rakes. Murriel Hopper, MD, [place]  Hematology-Oncology/Internal Medicine

## 2014-07-21 ENCOUNTER — Encounter: Payer: Self-pay | Admitting: Internal Medicine

## 2014-07-21 ENCOUNTER — Ambulatory Visit (INDEPENDENT_AMBULATORY_CARE_PROVIDER_SITE_OTHER): Payer: Medicare PPO | Admitting: Internal Medicine

## 2014-07-21 ENCOUNTER — Encounter: Payer: Self-pay | Admitting: Dietician

## 2014-07-21 ENCOUNTER — Ambulatory Visit: Payer: Commercial Managed Care - HMO | Admitting: Dietician

## 2014-07-21 ENCOUNTER — Ambulatory Visit: Payer: Commercial Managed Care - HMO | Admitting: Internal Medicine

## 2014-07-21 VITALS — BP 143/69 | HR 72 | Temp 98.1°F | Ht 60.0 in | Wt 232.5 lb

## 2014-07-21 DIAGNOSIS — E1143 Type 2 diabetes mellitus with diabetic autonomic (poly)neuropathy: Secondary | ICD-10-CM | POA: Diagnosis not present

## 2014-07-21 DIAGNOSIS — N170 Acute kidney failure with tubular necrosis: Secondary | ICD-10-CM | POA: Diagnosis not present

## 2014-07-21 DIAGNOSIS — E088 Diabetes mellitus due to underlying condition with unspecified complications: Secondary | ICD-10-CM

## 2014-07-21 DIAGNOSIS — I10 Essential (primary) hypertension: Secondary | ICD-10-CM

## 2014-07-21 DIAGNOSIS — K3184 Gastroparesis: Secondary | ICD-10-CM

## 2014-07-21 DIAGNOSIS — E119 Type 2 diabetes mellitus without complications: Secondary | ICD-10-CM

## 2014-07-21 LAB — HM DIABETES EYE EXAM

## 2014-07-21 MED ORDER — INSULIN LISPRO 100 UNIT/ML ~~LOC~~ SOLN: SUBCUTANEOUS | Status: DC

## 2014-07-21 NOTE — Assessment & Plan Note (Addendum)
Lab Results  Component Value Date   HGBA1C 12.7* 07/12/2014   HGBA1C 10.9 04/08/2014   HGBA1C 10.0 10/08/2013     Assessment: Diabetes control: good control (HgbA1C at goal) Progress toward A1C goal:  deteriorated Comments: Review of her glucose meter reading reveals significant hyperglycemia at post lunch. Patient admits to consumption of regular sodas and iced tea with her lunch and she understands that this is something that she needs to reduce. Fasting blood sugars are mostly within acceptable ranges. Slight level of hyperglycemia with dinner. Her current insulin regimen includes Lantus 44 units in the morning, Humalog 8 units prebreakfast, 12 units prelunch and 10 units predinner. Overall, there a discrepancy between her fasting blood sugars and her A1c with A1c being significantly higher than expected. Most likely this is due to postprandial (postlunch) hyperglycemia.    Plan: Medications:  Will increase prelunch Humalog from 12 units to 16 units. She will continue with Lantus 44 units in the morning. We'll also continue with Humalog 8 units prebreakfast and 10 units prelunch. Home glucose monitoring: Frequency: 3 times a day Timing: before breakfast;before lunch;before dinner Instruction/counseling given: reminded to get eye exam, reminded to bring blood glucose meter & log to each visit, reminded to bring medications to each visit, discussed the need for weight loss and discussed diet Educational resources provided: brochure Self management tools provided: copy of home glucose meter download;home glucose logbook Other plans: Follow up within 2 weeks. Retinal scan  Lipid panel

## 2014-07-21 NOTE — Assessment & Plan Note (Signed)
Check BMET today 

## 2014-07-21 NOTE — Patient Instructions (Signed)
General Instructions: Please Inject 8 units with breakfast and 16 units with lunch and 10 units with dinner  Please bring your medicines with you each time you come to clinic.  Medicines may include prescription medications, over-the-counter medications, herbal remedies, eye drops, vitamins, or other pills.   Progress Toward Treatment Goals:  Treatment Goal 04/08/2014  Hemoglobin A1C deteriorated  Blood pressure at goal  Stop smoking smoking the same amount  Prevent falls -    Self Care Goals & Plans:  Self Care Goal 07/21/2014  Manage my medications take my medicines as prescribed; bring my medications to every visit; refill my medications on time  Monitor my health -  Eat healthy foods drink diet soda or water instead of juice or soda; eat more vegetables; eat foods that are low in salt; eat baked foods instead of fried foods; eat fruit for snacks and desserts  Be physically active -  Stop smoking -    Home Blood Glucose Monitoring 04/08/2014  Check my blood sugar 3 times a day  When to check my blood sugar before meals     Care Management & Community Referrals:  Referral 01/30/2013  Referrals made for care management support diabetes educator; nutritionist  Referrals made to community resources smoking cessation; nutrition

## 2014-07-21 NOTE — Assessment & Plan Note (Addendum)
BP Readings from Last 3 Encounters:  07/21/14 143/69  07/18/14 179/74  07/08/14 150/80    Lab Results  Component Value Date   NA 144 07/18/2014   K 4.2 07/18/2014   CREATININE 0.94 07/18/2014    Assessment: Blood pressure control: mildly elevated Progress toward BP goal:  at goal Comments: Compliant with her medications, including metoprolol, 50 mg daily, lisinopril,10 mg daily Plan: Medications:  Continue with above medications. If blood pressure control remains suboptimal, may increase dose of lisinopril. Educational resources provided: brochure Self management tools provided: home blood pressure logbook Other plans: Follow up routinely. BMP today

## 2014-07-21 NOTE — Progress Notes (Signed)
Patient ID: RHILYN BERTIN, female   DOB: 04/29/1951, 63 y.o.   MRN: [id number]   Subjective:   HPI: Ms.Kristen Jerilynn Mages Wilding is a 63 y.o. woman with combined CHF EF 35-40% on AICD, NICM, HTN, HLD, uncontrolled DM2 presents for a hospital follow up.   Patient was discharged on 07/18/2014 after 6 days of hospitalization for diabetic gastroparesis. She reports that her prior symptoms of nausea and vomiting have improved with Reglan and PPI. She also reports compliance with her other medication for hypertension and diabetes. She has no new complaints today.  She has brought her pill bottles and I went over them with the patient.  Kindly see the A&P for the status of the pt's chronic medical problems.   ROS: Constitutional: Denies fever, chills, diaphoresis, appetite change and fatigue.  Respiratory: Denies SOB, DOE, cough, chest tightness, and wheezing. Denies chest pain. CVS: No chest pain, palpitations and leg swelling.  GI: No abdominal pain, nausea, vomiting, bloody stools GU: No dysuria, frequency, hematuria, or flank pain.  MSK: No myalgias, back pain, joint swelling, arthralgias  Psych: No depression symptoms. No SI or SA.    Objective:  Physical Exam: Filed Vitals:   07/21/14 1540  BP: 143/69  Pulse: 72  Temp: 98.1 F (36.7 C)  TempSrc: Oral  Height: 5' (1.524 m)  Weight: 232 lb 8 oz (105.461 kg)  SpO2: 100%   General: Well nourished. No acute distress.  HEENT: Normal oral mucosa. MMM.  Lungs: CTA bilaterally. Heart: RRR; no extra sounds or murmurs  Abdomen: Non-distended, normal bowel sounds, soft, nontender; no hepatosplenomegaly  Extremities: No pedal edema. No joint swelling or tenderness. Neurologic: Normal EOM,  Alert and oriented x3. No obvious neurologic/cranial nerve deficits.  Assessment & Plan:  Discussed case with my attending in the clinic, Dr. Eppie Gibson.  See problem based charting.     \

## 2014-07-21 NOTE — Addendum Note (Signed)
Addended by: Hulan Fray on: 07/21/2014 06:35 PM   Modules accepted: Orders

## 2014-07-21 NOTE — Assessment & Plan Note (Signed)
Symptoms have resolved since returning home.  Plan  - cont with Reglan and PPI  - No side effects from Reglan so far - May require gastric emptying study if symptoms recur

## 2014-07-21 NOTE — Progress Notes (Signed)
retinal images done and transmitted.

## 2014-07-22 ENCOUNTER — Other Ambulatory Visit: Payer: Self-pay | Admitting: Internal Medicine

## 2014-07-22 DIAGNOSIS — E119 Type 2 diabetes mellitus without complications: Secondary | ICD-10-CM

## 2014-07-22 LAB — GLUCOSE, CAPILLARY: Glucose-Capillary: 140 mg/dL — ABNORMAL HIGH (ref 70–99)

## 2014-07-22 LAB — BASIC METABOLIC PANEL WITHOUT GFR
BUN: 12 mg/dL (ref 6–23)
CO2: 29 meq/L (ref 19–32)
Calcium: 9.4 mg/dL (ref 8.4–10.5)
Chloride: 105 meq/L (ref 96–112)
Creat: 0.8 mg/dL (ref 0.50–1.10)
GFR, Est African American: >89 mL/min
GFR, Est Non African American: 80 mL/min
Glucose, Bld: 67 mg/dL — ABNORMAL LOW (ref 70–99)
Potassium: 3.9 meq/L (ref 3.5–5.3)
Sodium: 144 meq/L (ref 135–145)

## 2014-07-22 LAB — LIPID PANEL
Cholesterol: 144 mg/dL (ref 0–200)
HDL: 54 mg/dL (ref >39)
LDL Cholesterol: 60 mg/dL (ref 0–99)
Total CHOL/HDL Ratio: 2.7 Ratio
Triglycerides: 149 mg/dL (ref <150)
VLDL: 30 mg/dL (ref 0–40)

## 2014-07-22 NOTE — Progress Notes (Signed)
Case discussed with Dr. Kazibwe soon after the resident saw the patient.  We reviewed the resident's history and exam and pertinent patient test results.  I agree with the assessment, diagnosis, and plan of care documented in the resident's note. 

## 2014-07-23 ENCOUNTER — Encounter: Payer: Medicaid Other | Admitting: Internal Medicine

## 2014-07-28 ENCOUNTER — Encounter: Payer: Self-pay | Admitting: *Deleted

## 2014-08-05 ENCOUNTER — Telehealth: Payer: Self-pay | Admitting: *Deleted

## 2014-08-05 NOTE — Telephone Encounter (Signed)
Pt calls and states she is short of breath and wheezing, did not note this in conversation, she states she is not usually this way, she is ask to come to ED and states she does not have a ride, her daughter who is a nurse is teaching and she cannot bother her, she is ask to call 911 and states she cannot do that, she is offered an appt, states she cannot come to clinic til next week. She is advised she MUST come to ED asap, she states she will try

## 2014-08-05 NOTE — Telephone Encounter (Signed)
Thank you [name]. I agree with your recommendations. Pt needs to be evaluated to ascertain the etiology of her symptoms

## 2014-08-06 ENCOUNTER — Encounter (HOSPITAL_COMMUNITY): Payer: Self-pay | Admitting: *Deleted

## 2014-08-06 ENCOUNTER — Inpatient Hospital Stay (HOSPITAL_COMMUNITY)
Admission: EM | Admit: 2014-08-06 | Discharge: 2014-08-08 | DRG: 190 | Disposition: A | Discharge status: Home / Self Care | Payer: PRIVATE HEALTH INSURANCE | Attending: Infectious Diseases | Admitting: Infectious Diseases

## 2014-08-06 ENCOUNTER — Emergency Department (HOSPITAL_COMMUNITY): Payer: PRIVATE HEALTH INSURANCE

## 2014-08-06 DIAGNOSIS — E0590 Thyrotoxicosis, unspecified without thyrotoxic crisis or storm: Secondary | ICD-10-CM | POA: Diagnosis present

## 2014-08-06 DIAGNOSIS — Z6841 Body Mass Index (BMI) 40.0 and over, adult: Secondary | ICD-10-CM

## 2014-08-06 DIAGNOSIS — K3184 Gastroparesis: Secondary | ICD-10-CM | POA: Diagnosis present

## 2014-08-06 DIAGNOSIS — E1142 Type 2 diabetes mellitus with diabetic polyneuropathy: Secondary | ICD-10-CM

## 2014-08-06 DIAGNOSIS — J440 Chronic obstructive pulmonary disease with acute lower respiratory infection: Secondary | ICD-10-CM | POA: Diagnosis present

## 2014-08-06 DIAGNOSIS — T50995A Adverse effect of other drugs, medicaments and biological substances, initial encounter: Secondary | ICD-10-CM | POA: Diagnosis present

## 2014-08-06 DIAGNOSIS — E1143 Type 2 diabetes mellitus with diabetic autonomic (poly)neuropathy: Secondary | ICD-10-CM | POA: Diagnosis present

## 2014-08-06 DIAGNOSIS — E872 Acidosis: Secondary | ICD-10-CM | POA: Diagnosis present

## 2014-08-06 DIAGNOSIS — I10 Essential (primary) hypertension: Secondary | ICD-10-CM | POA: Diagnosis present

## 2014-08-06 DIAGNOSIS — J9602 Acute respiratory failure with hypercapnia: Secondary | ICD-10-CM | POA: Diagnosis present

## 2014-08-06 DIAGNOSIS — L97909 Non-pressure chronic ulcer of unspecified part of unspecified lower leg with unspecified severity: Secondary | ICD-10-CM | POA: Diagnosis present

## 2014-08-06 DIAGNOSIS — E1165 Type 2 diabetes mellitus with hyperglycemia: Secondary | ICD-10-CM | POA: Diagnosis present

## 2014-08-06 DIAGNOSIS — F17210 Nicotine dependence, cigarettes, uncomplicated: Secondary | ICD-10-CM | POA: Diagnosis present

## 2014-08-06 DIAGNOSIS — G4730 Sleep apnea, unspecified: Secondary | ICD-10-CM | POA: Diagnosis present

## 2014-08-06 DIAGNOSIS — Z888 Allergy status to other drugs, medicaments and biological substances status: Secondary | ICD-10-CM

## 2014-08-06 DIAGNOSIS — Z9114 Patient's other noncompliance with medication regimen: Secondary | ICD-10-CM | POA: Diagnosis present

## 2014-08-06 DIAGNOSIS — E876 Hypokalemia: Secondary | ICD-10-CM | POA: Diagnosis present

## 2014-08-06 DIAGNOSIS — E1129 Type 2 diabetes mellitus with other diabetic kidney complication: Secondary | ICD-10-CM

## 2014-08-06 DIAGNOSIS — Z794 Long term (current) use of insulin: Secondary | ICD-10-CM | POA: Diagnosis not present

## 2014-08-06 DIAGNOSIS — E039 Hypothyroidism, unspecified: Secondary | ICD-10-CM | POA: Diagnosis present

## 2014-08-06 DIAGNOSIS — J189 Pneumonia, unspecified organism: Secondary | ICD-10-CM

## 2014-08-06 DIAGNOSIS — Z79899 Other long term (current) drug therapy: Secondary | ICD-10-CM | POA: Diagnosis not present

## 2014-08-06 DIAGNOSIS — I429 Cardiomyopathy, unspecified: Secondary | ICD-10-CM | POA: Diagnosis present

## 2014-08-06 DIAGNOSIS — Z95810 Presence of automatic (implantable) cardiac defibrillator: Secondary | ICD-10-CM | POA: Diagnosis not present

## 2014-08-06 DIAGNOSIS — N179 Acute kidney failure, unspecified: Secondary | ICD-10-CM | POA: Diagnosis present

## 2014-08-06 DIAGNOSIS — I428 Other cardiomyopathies: Secondary | ICD-10-CM

## 2014-08-06 DIAGNOSIS — I5042 Chronic combined systolic (congestive) and diastolic (congestive) heart failure: Secondary | ICD-10-CM | POA: Diagnosis present

## 2014-08-06 DIAGNOSIS — J449 Chronic obstructive pulmonary disease, unspecified: Secondary | ICD-10-CM | POA: Diagnosis present

## 2014-08-06 DIAGNOSIS — Z9981 Dependence on supplemental oxygen: Secondary | ICD-10-CM | POA: Diagnosis not present

## 2014-08-06 DIAGNOSIS — F329 Major depressive disorder, single episode, unspecified: Secondary | ICD-10-CM | POA: Diagnosis present

## 2014-08-06 DIAGNOSIS — Z90710 Acquired absence of both cervix and uterus: Secondary | ICD-10-CM

## 2014-08-06 DIAGNOSIS — R0602 Shortness of breath: Secondary | ICD-10-CM

## 2014-08-06 DIAGNOSIS — E785 Hyperlipidemia, unspecified: Secondary | ICD-10-CM | POA: Diagnosis present

## 2014-08-06 DIAGNOSIS — F17200 Nicotine dependence, unspecified, uncomplicated: Secondary | ICD-10-CM | POA: Diagnosis present

## 2014-08-06 DIAGNOSIS — Z7982 Long term (current) use of aspirin: Secondary | ICD-10-CM

## 2014-08-06 DIAGNOSIS — E669 Obesity, unspecified: Secondary | ICD-10-CM | POA: Diagnosis present

## 2014-08-06 DIAGNOSIS — J441 Chronic obstructive pulmonary disease with (acute) exacerbation: Secondary | ICD-10-CM | POA: Diagnosis present

## 2014-08-06 DIAGNOSIS — E119 Type 2 diabetes mellitus without complications: Secondary | ICD-10-CM

## 2014-08-06 LAB — CBC WITH DIFFERENTIAL/PLATELET
Basophils Absolute: 0 10*3/uL (ref 0.0–0.1)
Basophils Relative: 0 % (ref 0–1)
Eosinophils Absolute: 0.5 10*3/uL (ref 0.0–0.7)
Eosinophils Relative: 7 % — ABNORMAL HIGH (ref 0–5)
HCT: 40.8 % (ref 36.0–46.0)
Hemoglobin: 13.2 g/dL (ref 12.0–15.0)
Lymphocytes Relative: 24 % (ref 12–46)
Lymphs Abs: 1.9 10*3/uL (ref 0.7–4.0)
MCH: 27.5 pg (ref 26.0–34.0)
MCHC: 32.4 g/dL (ref 30.0–36.0)
MCV: 85 fL (ref 78.0–100.0)
Monocytes Absolute: 0.5 10*3/uL (ref 0.1–1.0)
Monocytes Relative: 7 % (ref 3–12)
Neutro Abs: 4.8 10*3/uL (ref 1.7–7.7)
Neutrophils Relative %: 62 % (ref 43–77)
Platelets: 281 10*3/uL (ref 150–400)
RBC: 4.8 MIL/uL (ref 3.87–5.11)
RDW: 15.4 % (ref 11.5–15.5)
WBC: 7.8 10*3/uL (ref 4.0–10.5)

## 2014-08-06 LAB — BASIC METABOLIC PANEL
Anion gap: 16 — ABNORMAL HIGH (ref 5–15)
BUN: 8 mg/dL (ref 6–23)
CO2: 23 mEq/L (ref 19–32)
Calcium: 9 mg/dL (ref 8.4–10.5)
Chloride: 101 mEq/L (ref 96–112)
Creatinine, Ser: 0.61 mg/dL (ref 0.50–1.10)
GFR calc Af Amer: >90 mL/min (ref >90)
GFR calc non Af Amer: >90 mL/min (ref >90)
Glucose, Bld: 306 mg/dL — ABNORMAL HIGH (ref 70–99)
Potassium: 3.7 mEq/L (ref 3.7–5.3)
Sodium: 140 mEq/L (ref 137–147)

## 2014-08-06 LAB — I-STAT ARTERIAL BLOOD GAS, ED
Acid-Base Excess: 1 mmol/L (ref 0.0–2.0)
Acid-base deficit: 1 mmol/L (ref 0.0–2.0)
Bicarbonate: 26.3 mEq/L — ABNORMAL HIGH (ref 20.0–24.0)
Bicarbonate: 28.6 mEq/L — ABNORMAL HIGH (ref 20.0–24.0)
O2 Saturation: 98 %
O2 Saturation: 95 %
Patient temperature: 98.6
Patient temperature: 98.6
TCO2: 28 mmol/L (ref 0–100)
TCO2: 30 mmol/L (ref 0–100)
pCO2 arterial: 51.7 mmHg — ABNORMAL HIGH (ref 35.0–45.0)
pCO2 arterial: 56.4 mmHg — ABNORMAL HIGH (ref 35.0–45.0)
pH, Arterial: 7.314 — ABNORMAL LOW (ref 7.350–7.450)
pH, Arterial: 7.312 — ABNORMAL LOW (ref 7.350–7.450)
pO2, Arterial: 116 mmHg — ABNORMAL HIGH (ref 80.0–100.0)
pO2, Arterial: 85 mmHg (ref 80.0–100.0)

## 2014-08-06 LAB — CBC
HCT: 41.2 % (ref 36.0–46.0)
Hemoglobin: 12.9 g/dL (ref 12.0–15.0)
MCH: 27.4 pg (ref 26.0–34.0)
MCHC: 31.3 g/dL (ref 30.0–36.0)
MCV: 87.7 fL (ref 78.0–100.0)
Platelets: 230 10*3/uL (ref 150–400)
RBC: 4.7 MIL/uL (ref 3.87–5.11)
RDW: 15.8 % — ABNORMAL HIGH (ref 11.5–15.5)
WBC: 11.4 10*3/uL — ABNORMAL HIGH (ref 4.0–10.5)

## 2014-08-06 LAB — I-STAT TROPONIN, ED: Troponin i, poc: 0.03 ng/mL (ref 0.00–0.08)

## 2014-08-06 LAB — MRSA PCR SCREENING: MRSA by PCR: NEGATIVE

## 2014-08-06 LAB — CREATININE, SERUM
Creatinine, Ser: 0.68 mg/dL (ref 0.50–1.10)
GFR calc Af Amer: >90 mL/min (ref >90)
GFR calc non Af Amer: >90 mL/min (ref >90)

## 2014-08-06 LAB — TROPONIN I
Troponin I: <0.3 ng/mL (ref <0.30)
Troponin I: <0.3 ng/mL (ref <0.30)
Troponin I: <0.3 ng/mL (ref <0.30)

## 2014-08-06 LAB — CBG MONITORING, ED
Glucose-Capillary: 282 mg/dL — ABNORMAL HIGH (ref 70–99)
Glucose-Capillary: 279 mg/dL — ABNORMAL HIGH (ref 70–99)
Glucose-Capillary: 278 mg/dL — ABNORMAL HIGH (ref 70–99)

## 2014-08-06 LAB — GLUCOSE, CAPILLARY
Glucose-Capillary: 224 mg/dL — ABNORMAL HIGH (ref 70–99)
Glucose-Capillary: 353 mg/dL — ABNORMAL HIGH (ref 70–99)

## 2014-08-06 LAB — PRO B NATRIURETIC PEPTIDE: Pro B Natriuretic peptide (BNP): 216.7 pg/mL — ABNORMAL HIGH (ref 0–125)

## 2014-08-06 LAB — TSH: TSH: 0.415 u[IU]/mL (ref 0.350–4.500)

## 2014-08-06 LAB — I-STAT CG4 LACTIC ACID, ED: Lactic Acid, Venous: 2.82 mmol/L — ABNORMAL HIGH (ref 0.5–2.2)

## 2014-08-06 MED ORDER — ALBUTEROL (5 MG/ML) CONTINUOUS INHALATION SOLN
10.0000 mg/h | INHALATION_SOLUTION | RESPIRATORY_TRACT | Status: DC
  Administered 2014-08-06: 10 mg/h via RESPIRATORY_TRACT
  Filled 2014-08-06: qty 20

## 2014-08-06 MED ORDER — LEVOFLOXACIN IN D5W 750 MG/150ML IV SOLN
750.0000 mg | INTRAVENOUS | Status: DC
  Administered 2014-08-06 – 2014-08-07 (×2): 750 mg via INTRAVENOUS
  Filled 2014-08-06 (×3): qty 150

## 2014-08-06 MED ORDER — INSULIN ASPART 100 UNIT/ML ~~LOC~~ SOLN
0.0000 [IU] | Freq: Three times a day (TID) | SUBCUTANEOUS | Status: DC
  Administered 2014-08-06: 5 [IU] via SUBCUTANEOUS
  Administered 2014-08-06: 8 [IU] via SUBCUTANEOUS
  Administered 2014-08-07: 15 [IU] via SUBCUTANEOUS
  Filled 2014-08-06: qty 1

## 2014-08-06 MED ORDER — HEPARIN SODIUM (PORCINE) 5000 UNIT/ML IJ SOLN
5000.0000 [IU] | Freq: Three times a day (TID) | INTRAMUSCULAR | Status: DC
  Administered 2014-08-06 – 2014-08-08 (×7): 5000 [IU] via SUBCUTANEOUS
  Filled 2014-08-06 (×13): qty 1

## 2014-08-06 MED ORDER — IPRATROPIUM BROMIDE 0.02 % IN SOLN
0.5000 mg | Freq: Once | RESPIRATORY_TRACT | Status: AC
  Administered 2014-08-06: 0.5 mg via RESPIRATORY_TRACT
  Filled 2014-08-06: qty 2.5

## 2014-08-06 MED ORDER — MAGNESIUM SULFATE 2 GM/50ML IV SOLN
2.0000 g | Freq: Once | INTRAVENOUS | Status: AC
  Administered 2014-08-06: 2 g via INTRAVENOUS
  Filled 2014-08-06: qty 50

## 2014-08-06 MED ORDER — NICOTINE 7 MG/24HR TD PT24
7.0000 mg | MEDICATED_PATCH | Freq: Every day | TRANSDERMAL | Status: DC
  Administered 2014-08-06 – 2014-08-08 (×3): 7 mg via TRANSDERMAL
  Filled 2014-08-06 (×4): qty 1

## 2014-08-06 MED ORDER — ACETAMINOPHEN 325 MG PO TABS: 650.0000 mg | ORAL_TABLET | Freq: Four times a day (QID) | ORAL | Status: DC | PRN

## 2014-08-06 MED ORDER — SODIUM CHLORIDE 0.9 % IJ SOLN
3.0000 mL | Freq: Two times a day (BID) | INTRAMUSCULAR | Status: DC
  Administered 2014-08-06 – 2014-08-08 (×5): 3 mL via INTRAVENOUS
  Filled 2014-08-06: qty 3

## 2014-08-06 MED ORDER — ALBUTEROL SULFATE (2.5 MG/3ML) 0.083% IN NEBU: 3.0000 mL | INHALATION_SOLUTION | Freq: Four times a day (QID) | RESPIRATORY_TRACT | Status: DC | PRN

## 2014-08-06 MED ORDER — AMLODIPINE BESYLATE 10 MG PO TABS
10.0000 mg | ORAL_TABLET | Freq: Every day | ORAL | Status: DC
  Administered 2014-08-06 – 2014-08-08 (×3): 10 mg via ORAL
  Filled 2014-08-06: qty 2
  Filled 2014-08-06 (×2): qty 1

## 2014-08-06 MED ORDER — ALBUTEROL SULFATE HFA 108 (90 BASE) MCG/ACT IN AERS: 2.0000 | INHALATION_SPRAY | Freq: Four times a day (QID) | RESPIRATORY_TRACT | Status: DC | PRN

## 2014-08-06 MED ORDER — METOPROLOL SUCCINATE ER 50 MG PO TB24
50.0000 mg | ORAL_TABLET | Freq: Every day | ORAL | Status: DC
  Administered 2014-08-06 – 2014-08-08 (×3): 50 mg via ORAL
  Filled 2014-08-06: qty 2
  Filled 2014-08-06 (×2): qty 1

## 2014-08-06 MED ORDER — CITALOPRAM HYDROBROMIDE 20 MG PO TABS
20.0000 mg | ORAL_TABLET | Freq: Every day | ORAL | Status: DC
  Administered 2014-08-06 – 2014-08-08 (×3): 20 mg via ORAL
  Filled 2014-08-06 (×2): qty 1
  Filled 2014-08-06: qty 2

## 2014-08-06 MED ORDER — NITROGLYCERIN 2 % TD OINT
1.0000 [in_us] | TOPICAL_OINTMENT | Freq: Once | TRANSDERMAL | Status: AC
  Administered 2014-08-06: 1 [in_us] via TOPICAL
  Filled 2014-08-06: qty 1

## 2014-08-06 MED ORDER — ATORVASTATIN CALCIUM 80 MG PO TABS
80.0000 mg | ORAL_TABLET | Freq: Every day | ORAL | Status: DC
  Administered 2014-08-06 – 2014-08-07 (×2): 80 mg via ORAL
  Filled 2014-08-06 (×4): qty 1

## 2014-08-06 MED ORDER — IPRATROPIUM-ALBUTEROL 0.5-2.5 (3) MG/3ML IN SOLN
RESPIRATORY_TRACT | Status: AC
  Filled 2014-08-06: qty 3

## 2014-08-06 MED ORDER — FUROSEMIDE 20 MG PO TABS
40.0000 mg | ORAL_TABLET | Freq: Once | ORAL | Status: AC
Start: 2014-08-06 — End: 2014-08-06
  Administered 2014-08-06: 40 mg via ORAL
  Filled 2014-08-06: qty 2

## 2014-08-06 MED ORDER — METOCLOPRAMIDE HCL 5 MG PO TABS
5.0000 mg | ORAL_TABLET | Freq: Three times a day (TID) | ORAL | Status: DC
  Administered 2014-08-06 – 2014-08-08 (×6): 5 mg via ORAL
  Filled 2014-08-06 (×13): qty 1

## 2014-08-06 MED ORDER — ONDANSETRON HCL 4 MG/2ML IJ SOLN: 4.0000 mg | Freq: Four times a day (QID) | INTRAMUSCULAR | Status: DC | PRN

## 2014-08-06 MED ORDER — DEXTROSE 5 % IV SOLN
500.0000 mg | Freq: Once | INTRAVENOUS | Status: AC
  Administered 2014-08-06: 500 mg via INTRAVENOUS
  Filled 2014-08-06: qty 500

## 2014-08-06 MED ORDER — ONDANSETRON HCL 4 MG PO TABS: 4.0000 mg | ORAL_TABLET | Freq: Four times a day (QID) | ORAL | Status: DC | PRN

## 2014-08-06 MED ORDER — GABAPENTIN 300 MG PO CAPS
300.0000 mg | ORAL_CAPSULE | Freq: Every day | ORAL | Status: DC
  Administered 2014-08-06 – 2014-08-07 (×2): 300 mg via ORAL
  Filled 2014-08-06 (×4): qty 1

## 2014-08-06 MED ORDER — AMLODIPINE BESYLATE 5 MG PO TABS
10.0000 mg | ORAL_TABLET | Freq: Once | ORAL | Status: AC
  Administered 2014-08-06: 10 mg via ORAL
  Filled 2014-08-06: qty 2

## 2014-08-06 MED ORDER — ASPIRIN EC 81 MG PO TBEC
81.0000 mg | DELAYED_RELEASE_TABLET | Freq: Every day | ORAL | Status: DC
  Administered 2014-08-06 – 2014-08-08 (×3): 81 mg via ORAL
  Filled 2014-08-06 (×3): qty 1

## 2014-08-06 MED ORDER — METHYLPREDNISOLONE SODIUM SUCC 125 MG IJ SOLR
125.0000 mg | Freq: Once | INTRAMUSCULAR | Status: AC
  Administered 2014-08-06: 125 mg via INTRAVENOUS
  Filled 2014-08-06: qty 2

## 2014-08-06 MED ORDER — PANTOPRAZOLE SODIUM 40 MG PO TBEC
40.0000 mg | DELAYED_RELEASE_TABLET | Freq: Every day | ORAL | Status: DC
  Administered 2014-08-07 – 2014-08-08 (×2): 40 mg via ORAL
  Filled 2014-08-06 (×2): qty 1

## 2014-08-06 MED ORDER — LISINOPRIL 10 MG PO TABS
10.0000 mg | ORAL_TABLET | Freq: Every day | ORAL | Status: DC
  Administered 2014-08-06 – 2014-08-08 (×3): 10 mg via ORAL
  Filled 2014-08-06 (×3): qty 1

## 2014-08-06 MED ORDER — NITROGLYCERIN 0.4 MG SL SUBL: 0.4000 mg | SUBLINGUAL_TABLET | SUBLINGUAL | Status: DC | PRN

## 2014-08-06 MED ORDER — IPRATROPIUM-ALBUTEROL 0.5-2.5 (3) MG/3ML IN SOLN
3.0000 mL | RESPIRATORY_TRACT | Status: DC
  Administered 2014-08-06 – 2014-08-08 (×14): 3 mL via RESPIRATORY_TRACT
  Filled 2014-08-06 (×14): qty 3

## 2014-08-06 MED ORDER — INSULIN GLARGINE 100 UNIT/ML ~~LOC~~ SOLN
26.0000 [IU] | Freq: Every day | SUBCUTANEOUS | Status: DC
  Administered 2014-08-06: 26 [IU] via SUBCUTANEOUS
  Filled 2014-08-06 (×2): qty 0.26

## 2014-08-06 MED ORDER — ACETAMINOPHEN 650 MG RE SUPP: 650.0000 mg | Freq: Four times a day (QID) | RECTAL | Status: DC | PRN

## 2014-08-06 MED ORDER — ENALAPRILAT 1.25 MG/ML IV SOLN: 1.2500 mg | Freq: Once | INTRAVENOUS | Status: DC

## 2014-08-06 NOTE — ED Notes (Signed)
  CBG 279

## 2014-08-06 NOTE — ED Notes (Signed)
You can reach pt's boyfriend; Estil Daft at pt's home number [phone] or his cell phone [phone]

## 2014-08-06 NOTE — ED Notes (Signed)
CBG 282 

## 2014-08-06 NOTE — ED Notes (Signed)
Placed sputum collection device in room/labelled/explained to pt., RT to monitor.

## 2014-08-06 NOTE — ED Notes (Signed)
RT note: Pt. Able to cough up Sputum, labelled, requisition printed sent to lab.

## 2014-08-06 NOTE — H&P (Signed)
Date: 08/06/2014               Patient Name:  [name] MRN: [id number]  DOB: 1951-04-20 Age / Sex: 63 y.o., female   PCP: Aldine Contes, MD         Medical Service: Internal Medicine Teaching Service         Attending Physician: Dr. Everlene Balls, MD    First Contact: Dr. Marvel Plan Pager: [phone]  Second Contact: Dr. Ronnald Ramp Pager: [phone]       After Hours (After 5p/  First Contact Pager: [phone]  weekends / holidays): Second Contact Pager: [phone]   Chief Complaint: SOB  History of Present Illness: The patient was brought in by EMS in respiratory distress sats in 80s on RA and was put on CPAP and then BIPAP in ED with significant improvement. During this interview pt had been weaned to 3L [place]. The pt states that for the past 2 wks she has had increased dyspnea. She states that she stopped taking all her BP meds except her lisinopril for the past 2 wks (but was also prescribed a BB but didn't take it) because of kidney problems (upon chart review pt had amlodipine, lasix, spironolactone all discontinued 07/14/14). She continued to have periods of LE edema and worsening dyspnea but not associated with any CP, HA, syncope, or palpitations. She denied any PND and was using 2L home O2 as usual. She has recently noticed a cough that is intermittently productive of white sputum, she denied any fever, chills, sick contacts, rash, abdominal pain, nausea/vomiting/diarrhea, and no ICD firing. The patient usually smokes 3-5 cigarettes daily but hasn't been able to for past 3 days given significant dyspnea.   Meds: Current Facility-Administered Medications  Medication Dose Route Frequency Provider Last Rate Last Dose  . albuterol (PROVENTIL,VENTOLIN) solution continuous neb  10 mg/hr Nebulization Continuous Everlene Balls, MD 2 mL/hr at 08/06/14 0523 10 mg/hr at 08/06/14 0523  . azithromycin (ZITHROMAX) 500 mg in dextrose 5 % 250 mL IVPB  500 mg Intravenous Once Everlene Balls, MD 250 mL/hr at  08/06/14 0725 500 mg at 08/06/14 0725  . nitroGLYCERIN (NITROSTAT) SL tablet 0.4 mg  0.4 mg Sublingual Q5 min PRN Everlene Balls, MD       Current Outpatient Prescriptions  Medication Sig Dispense Refill  . albuterol (VENTOLIN HFA) 108 (90 BASE) MCG/ACT inhaler Inhale 2 puffs into the lungs every 6 (six) hours as needed. Shortness of breath 1 Inhaler 6  . aspirin EC 81 MG tablet Take 81 mg by mouth daily.    [name] atorvastatin (LIPITOR) 80 MG tablet Take 1 tablet (80 mg total) by mouth at bedtime. 30 tablet 11  . citalopram (CELEXA) 20 MG tablet Take 1 tablet (20 mg total) by mouth daily. 30 tablet 11  . gabapentin (NEURONTIN) 300 MG capsule Take 1 capsule (300 mg total) by mouth at bedtime. 30 capsule 11  . glucose blood (ONE TOUCH ULTRA TEST) test strip Use as instructed by doctor up to 3x daily,dx code 250.02 insulin requiring 100 each 5  . insulin glargine (LANTUS) 100 UNIT/ML injection Inject 44 Units into the skin daily with breakfast.    . insulin lispro (HUMALOG) 100 UNIT/ML injection Inject 8 units with breakfast and 16 units with lunch and 10 units with dinner 10 mL 5  . lisinopril (PRINIVIL,ZESTRIL) 10 MG tablet Take 1 tablet (10 mg total) by mouth daily. 14 tablet 0  . metoCLOPramide (REGLAN) 5 MG tablet Take 1 tablet (5  mg total) by mouth 3 (three) times daily before meals. 90 tablet 0  . metoprolol succinate (TOPROL-XL) 50 MG 24 hr tablet Take 1 tablet (50 mg total) by mouth daily. Take with or immediately following a meal. 14 tablet 0  . nitroGLYCERIN (NITROSTAT) 0.4 MG SL tablet Place 0.4 mg under the tongue every 5 (five) minutes as needed for chest pain.    . pantoprazole (PROTONIX) 40 MG tablet Take 1 tablet (40 mg total) by mouth daily before breakfast. 30 tablet 0    Allergies: Allergies as of 08/06/2014 - Review Complete 08/06/2014  Allergen Reaction Noted  . Avandia [rosiglitazone maleate]  04/13/2011  . Hydrocortisone  10/06/2009  . Pioglitazone  03/12/2009   Past Medical  History  Diagnosis Date  . Hyperthyroidism, subclinical   . Post menopausal syndrome   . Obesity   . Cardiac defibrillator in situ     Atlas II VR (SJM) implanted by Dr Lovena Le  . Eczema   . Lipoma   . Chronic ulcer of leg   . Hyperlipidemia   . Chronic combined systolic and diastolic heart failure     a. EF 35-40% in past;  b. Echo 7/13:  EF 45-50%, Gr 2 diast dysfn, mild AI, mild MAC, trivial MR, mild LAE, PASP 47.  [name] NICM (nonischemic cardiomyopathy)   . Diabetes mellitus   . HTN (hypertension)   . Elevated alkaline phosphatase level     GGT and 5'nucleotidase 8/13 normal  . Hx of cardiac cath     a. [place] 2003 normal;  b. LHC 6/13:  Mild calcification in the LM, o/w normal coronary arteries, EF 45%.   . Sleep apnea     pt denies 04/12/2013  . COPD (chronic obstructive pulmonary disease)     O2 at night  . Automatic implantable cardioverter-defibrillator in situ   . CHF (congestive heart failure)   . Depression   . Implantable cardioverter-defibrillator (ICD) generator end of life   . ATN (acute tubular necrosis) 07/15/2014   Past Surgical History  Procedure Laterality Date  . Cardiac defibrillator placement  05/04/2007    SJM Atlas II VR ICD  . Hysteroscopy    . Cardiac defibrillator placement    . Abdominal hysterectomy    . Cardiac catheterization    . Insert / replace / remove pacemaker    . Tubal ligation    . Hernia repair    . Colonoscopy N/A 04/12/2013    Procedure: COLONOSCOPY;  Surgeon: Beryle Beams, MD;  Location: [facility];  Service: Endoscopy;  Laterality: N/A;  pt.has defibrilator   Family History  Problem Relation Age of Onset  . Stroke Mother   . Seizures Father   . Diabetes Sister   . Asthma Maternal Aunt     aunts  . Asthma Maternal Uncle     uncles  . Heart disease Father   . Heart disease Paternal Aunt     aunts  . Heart disease Paternal Uncle     uncles  . Heart disease Maternal Aunt     aunts  . Heart disease Maternal Uncle      uncles  . Heart disease Maternal Grandfather    History   Social History  . Marital Status: Widowed    Spouse Name: Alroy Dust    Number of Children: N/A  . Years of Education: 11   Occupational History  . disabled    Social History Main Topics  . Smoking status: Light Tobacco Smoker -- 0.25  packs/day for 35 years    Types: Cigarettes  . Smokeless tobacco: Not on file     Comment: currently smoking 3-4 cigs daily or less.  . Alcohol Use: 0.6 oz/week    1 Glasses of wine per week  . Drug Use: No  . Sexual Activity: Not on file   Other Topics Concern  . Not on file   Social History Narrative   ** Merged History Encounter **       Married    Review of Systems: Pertinent items are noted in HPI.  Physical Exam: Blood pressure 131/71, pulse 130, temperature 97.8 F (36.6 C), temperature source Oral, resp. rate 24, height 5' (1.524 m), weight 232 lb (105.235 kg), SpO2 96 %. General: resting in bed in acute distress, tachypneic able to speak in 2-3 work sentences HEENT: PERRL, EOMI, no scleral icterus Cardiac: tachy, RR, no rubs, murmurs or gallops Pulm: prolonged expiratory phase, diffuse wheezes, some rhonchi in lower lung fields, tachypneic with some increased work of breathing Abd: soft, nontender, nondistended, BS present Ext: warm and well perfused, no pedal edema Neuro: alert and oriented X3, cranial nerves II-XII grossly intact   Lab results: Basic Metabolic Panel:  Recent Labs  08/06/14 0308  NA 140  K 3.7  CL 101  CO2 23  GLUCOSE 306*  BUN 8  CREATININE 0.61  CALCIUM 9.0   CBC:  Recent Labs  08/06/14 0308  WBC 7.8  NEUTROABS 4.8  HGB 13.2  HCT 40.8  MCV 85.0  PLT 281   BNP:  Recent Labs  08/06/14 0308  PROBNP 216.7*   CBG:  Recent Labs  08/06/14 0305  GLUCAP 282*   Imaging results:  Dg Chest Port 1 View  08/06/2014   CLINICAL DATA:  Shortness of breath tonight.  EXAM: PORTABLE CHEST - 1 VIEW  COMPARISON:  07/12/2014  FINDINGS:  Cardiac pacemaker. Shallow inspiration. Borderline heart size with normal pulmonary vascularity. No focal airspace disease or consolidation in the lungs. No blunting of costophrenic angles. No pneumothorax.  IMPRESSION: No active disease.   Electronically Signed   By: Lucienne Capers M.D.   On: 08/06/2014 03:23    Other results: EKG: unchanged from previous tracings, sinus tachycardia, prolonged QT interval, bilateral atrial enlargement and nonspecific t wave inversions in anterolateral leads unchanged from previous.  Assessment & Plan by Problem:  #Acute respiratory acidosis in the setting of presumed COPD: likely multifactorial in setting of organic lung disease and change in medications. ABG revealed an acute respiratory acidosis given normal HCO3 and increased CO2. CXR w/o infiltrate. Pt does meet COPD exacerbation criteria with increased dyspnea and sputum production. Wells score low risk and [place] is moderate just for HR but in setting of continuous neb therefore less likely PE. Last PFTs 10/2011 showed FEV/FEV1 76%, restrictive lung pattern with insignificant response to bronchodilator.  -SDU admission -NPO at this time -duonebs -repeat CXR 2 view once more stable likely in AM 08/07/14 -pt received IV azithromycin in ED>>will transition to levaquin (given cardiac hx risk factor for pseudomonas with recent hospitalization) -sputum culture -cont O2  # Acute HTN urgency:  Pt initially elevated SBP 200s likely 2/2 medication changes and poor pt medical literacy. During last hospitalization pt developed ATN in the setting of hypotension and therefore was only continued on ACE and BB but pt only compliant with ACE. Pt received NTG paste, amlodipine, and lasix in the ED. SBP well below goal in 150s.  -cont ACEi, add back BB, and  cont amlodipine -reassess in AM for lasix use as pt doesn't appear fluid overloaded  # Chronic diastolic heart failure: last echo 03/2014 EF 35-40% and normal WMA with  only grade 1 diastolic dysfxn. Last ICD check 7/15 didn't reveal any VT/VF events. proBNP slightly elevated 216 which is much lower than previous readings. CXR didn't show vascular congestion and pt has not PE findings of volume overload. istat trops negative -cont to trend trops -repeat EKG in the AM  - cont lisinopril, metoprolol, and consider lasix  -smoking cessation counseling - nicotine patch   # Diabetes uncontrolled and complicated by presumed gastroporesis: last HgbA1c 12.7 07/12/14 which is decompensated control. Pt slightly hyperglycemic today with only slight AG elevation. Pt didn't receive gastric emptying study at this time.  -given NPO status in setting of respiratory distress SSI -consider restart lantus  -restart reglan once eating  # Hypothyroidism: last TSH 0.581 pt subclinical and not on medication at this time.  -repeat TSH  Dispo: Disposition is deferred at this time, awaiting improvement of current medical problems. Anticipated discharge in approximately 2-3 day(s).   The patient does have a current PCP (Aldine Contes, MD) and does need an [facility] hospital follow-up appointment after discharge.  The patient does not have transportation limitations that hinder transportation to clinic appointments.  Signed: Clinton Gallant, MD 08/06/2014, 8:06 AM

## 2014-08-06 NOTE — ED Provider Notes (Signed)
CSN: [id number]     Arrival date & time 08/06/14  0247 History   First MD Initiated Contact with Patient 08/06/14 0256     Chief Complaint  Patient presents with  . Shortness of Breath     (Consider location/radiation/quality/duration/timing/severity/associated sxs/prior Treatment) HPI [name] is a 63 y.o. female with past medical history of COPD, CHF, hypertension, hyperlipidemia, diabetes coming in with shortness of breath. Per EMS, patient was found to be 80% on room air upon their arrival. Patient was recently admitted to this hospital and taken off of several high blood pressure medications and Lasix due to her kidney function. Since then she has had worsening shortness of breath. EMS applied C Pap and her oxygen saturation rose to 100%. Patient cannot get a full history due to the acuity of her condition, she is denying any chest pain however she only states it is difficult for her to breathe. She's had no recent fevers or productive cough.    Past Medical History  Diagnosis Date  . Hyperthyroidism, subclinical   . Post menopausal syndrome   . Obesity   . Cardiac defibrillator in situ     Atlas II VR (SJM) implanted by Dr Lovena Le  . Eczema   . Lipoma   . Chronic ulcer of leg   . Hyperlipidemia   . Chronic combined systolic and diastolic heart failure     a. EF 35-40% in past;  b. Echo 7/13:  EF 45-50%, Gr 2 diast dysfn, mild AI, mild MAC, trivial MR, mild LAE, PASP 47.  [name] NICM (nonischemic cardiomyopathy)   . Diabetes mellitus   . HTN (hypertension)   . Elevated alkaline phosphatase level     GGT and 5'nucleotidase 8/13 normal  . Hx of cardiac cath     a. [place] 2003 normal;  b. LHC 6/13:  Mild calcification in the LM, o/w normal coronary arteries, EF 45%.   . Sleep apnea     pt denies 04/12/2013  . COPD (chronic obstructive pulmonary disease)     O2 at night  . Automatic implantable cardioverter-defibrillator in situ   . CHF (congestive heart failure)   . Depression    . Implantable cardioverter-defibrillator (ICD) generator end of life   . ATN (acute tubular necrosis) 07/15/2014   Past Surgical History  Procedure Laterality Date  . Cardiac defibrillator placement  05/04/2007    SJM Atlas II VR ICD  . Hysteroscopy    . Cardiac defibrillator placement    . Abdominal hysterectomy    . Cardiac catheterization    . Insert / replace / remove pacemaker    . Tubal ligation    . Hernia repair    . Colonoscopy N/A 04/12/2013    Procedure: COLONOSCOPY;  Surgeon: Beryle Beams, MD;  Location: [facility];  Service: Endoscopy;  Laterality: N/A;  pt.has defibrilator   Family History  Problem Relation Age of Onset  . Stroke Mother   . Seizures Father   . Diabetes Sister   . Asthma Maternal Aunt     aunts  . Asthma Maternal Uncle     uncles  . Heart disease Father   . Heart disease Paternal Aunt     aunts  . Heart disease Paternal Uncle     uncles  . Heart disease Maternal Aunt     aunts  . Heart disease Maternal Uncle     uncles  . Heart disease Maternal Grandfather    History  Substance Use Topics  .  Smoking status: Light Tobacco Smoker -- 0.25 packs/day for 35 years    Types: Cigarettes  . Smokeless tobacco: Not on file     Comment: currently smoking 3-4 cigs daily or less.  . Alcohol Use: 0.6 oz/week    1 Glasses of wine per week   OB History    No data available     Review of Systems  Unable to perform ROS: Acuity of condition      Allergies  Avandia; Hydrocortisone; and Pioglitazone  Home Medications   Prior to Admission medications   Medication Sig Start Date End Date Taking? Authorizing Provider  albuterol (VENTOLIN HFA) 108 (90 BASE) MCG/ACT inhaler Inhale 2 puffs into the lungs every 6 (six) hours as needed. Shortness of breath 07/09/13  Yes Wilber Oliphant, MD  aspirin EC 81 MG tablet Take 81 mg by mouth daily.   Yes Historical Provider, MD  atorvastatin (LIPITOR) 80 MG tablet Take 1 tablet (80 mg total) by mouth at  bedtime. 01/30/13  Yes Otho Bellows, MD  citalopram (CELEXA) 20 MG tablet Take 1 tablet (20 mg total) by mouth daily. 01/30/13  Yes Otho Bellows, MD  gabapentin (NEURONTIN) 300 MG capsule Take 1 capsule (300 mg total) by mouth at bedtime. 07/04/14  Yes Nischal Dareen Piano, MD  glucose blood (ONE TOUCH ULTRA TEST) test strip Use as instructed by doctor up to 3x daily,dx code 250.02 insulin requiring 05/20/13  Yes Wilber Oliphant, MD  insulin glargine (LANTUS) 100 UNIT/ML injection Inject 44 Units into the skin daily with breakfast. 04/08/13  Yes Wilber Oliphant, MD  insulin lispro (HUMALOG) 100 UNIT/ML injection Inject 8 units with breakfast and 16 units with lunch and 10 units with dinner 07/21/14  Yes Jessee Avers, MD  lisinopril (PRINIVIL,ZESTRIL) 10 MG tablet Take 1 tablet (10 mg total) by mouth daily. 07/18/14  Yes Charlott Rakes, MD  metoCLOPramide (REGLAN) 5 MG tablet Take 1 tablet (5 mg total) by mouth 3 (three) times daily before meals. 07/14/14  Yes Tasrif Ahmed, MD  metoprolol succinate (TOPROL-XL) 50 MG 24 hr tablet Take 1 tablet (50 mg total) by mouth daily. Take with or immediately following a meal. 07/18/14  Yes Charlott Rakes, MD  nitroGLYCERIN (NITROSTAT) 0.4 MG SL tablet Place 0.4 mg under the tongue every 5 (five) minutes as needed for chest pain.   Yes Historical Provider, MD  pantoprazole (PROTONIX) 40 MG tablet Take 1 tablet (40 mg total) by mouth daily before breakfast. 07/14/14  Yes Tasrif Ahmed, MD   BP 198/100 mmHg  Pulse 112  Temp(Src) 97.8 F (36.6 C) (Oral)  Resp 24  Ht 5' (1.524 m)  Wt 232 lb (105.235 kg)  BMI 45.31 kg/m2  SpO2 100% Physical Exam  Constitutional: She appears well-developed and well-nourished. She appears distressed.  HENT:  Head: Normocephalic and atraumatic.  Eyes: Conjunctivae and EOM are normal. Pupils are equal, round, and reactive to light. No scleral icterus.  Neck: Normal range of motion. Neck supple. No JVD present. No tracheal deviation  present. No thyromegaly present.  Cardiovascular: Normal rate, regular rhythm and normal heart sounds.  Exam reveals no gallop and no friction rub.   No murmur heard. Pulmonary/Chest: She is in respiratory distress. She has wheezes. She exhibits no tenderness.  Poor air entry, wheezing heard diffusely in the posterior fields. There is tachypnea and increased work of breathing.  Abdominal: Soft. Bowel sounds are normal. She exhibits no distension and no mass. There is no tenderness. There is  no rebound and no guarding.  Musculoskeletal: Normal range of motion. She exhibits no edema or tenderness.  Lymphadenopathy:    She has no cervical adenopathy.  Neurological: She is alert. No cranial nerve deficit. She exhibits normal muscle tone.  Skin: Skin is warm and dry. No rash noted. She is not diaphoretic. No erythema. No pallor.  Nursing note and vitals reviewed.   ED Course  Procedures (including critical care time) Labs Review Labs Reviewed  CBC WITH DIFFERENTIAL - Abnormal; Notable for the following:    Eosinophils Relative 7 (*)    All other components within normal limits  PRO B NATRIURETIC PEPTIDE - Abnormal; Notable for the following:    Pro B Natriuretic peptide (BNP) 216.7 (*)    All other components within normal limits  BASIC METABOLIC PANEL - Abnormal; Notable for the following:    Glucose, Bld 306 (*)    Anion gap 16 (*)    All other components within normal limits  I-STAT CG4 LACTIC ACID, ED - Abnormal; Notable for the following:    Lactic Acid, Venous 2.82 (*)    All other components within normal limits  CBG MONITORING, ED - Abnormal; Notable for the following:    Glucose-Capillary 282 (*)    All other components within normal limits  I-STAT ARTERIAL BLOOD GAS, ED - Abnormal; Notable for the following:    pH, Arterial 7.314 (*)    pCO2 arterial 51.7 (*)    pO2, Arterial 116.0 (*)    Bicarbonate 26.3 (*)    All other components within normal limits  BLOOD GAS,  ARTERIAL  I-STAT TROPOININ, ED    Imaging Review Dg Chest Port 1 View  08/06/2014   CLINICAL DATA:  Shortness of breath tonight.  EXAM: PORTABLE CHEST - 1 VIEW  COMPARISON:  07/12/2014  FINDINGS: Cardiac pacemaker. Shallow inspiration. Borderline heart size with normal pulmonary vascularity. No focal airspace disease or consolidation in the lungs. No blunting of costophrenic angles. No pneumothorax.  IMPRESSION: No active disease.   Electronically Signed   By: Lucienne Capers M.D.   On: 08/06/2014 03:23     EKG Interpretation   Date/Time:  Wednesday August 06 2014 02:58:53 EST Ventricular Rate:  99 PR Interval:  176 QRS Duration: 94 QT Interval:  408 QTC Calculation: 524 R Axis:   41 Text Interpretation:  Sinus tachycardia Ventricular premature complex  Biatrial enlargement Nonspecific T abnrm, anterolateral leads Prolonged QT  interval Confirmed by Glynn Octave [phone]) on 08/06/2014 3:47:56  AM      MDM   Final diagnoses:  SOB (shortness of breath)    Patient presents to the emergency department for shortness of breath. Upon arrival, patient continued to be short of breath. She was placed on BiPAP immediately, given breathing treatments, and magnesium. Patient was not given steroids due to a listed allergy, she is unable to tell me this is a true allergy. Patient's blood pressure continues to be in the high 200s, likely from stopping her medications. She was given Nitropaste, amlodipine, Lasix in the emergency department. Patient will be admitted to step down unit with the internal medicine resident service for continued treatment. If it is any pneumonia.  Upon repeat assessment patient's tachypnea has resolved and she appears much more comfortable on the BiPAP. She continues to have diffuse wheezing.  CRITICAL CARE Performed by: Everlene Balls   Total critical care time: 36min  Critical care time was exclusive of separately billable procedures and treating other  patients.  Critical  care was necessary to treat or prevent imminent or life-threatening deterioration.  Critical care was time spent personally by me on the following activities: development of treatment plan with patient and/or surrogate as well as nursing, discussions with consultants, evaluation of patient's response to treatment, examination of patient, obtaining history from patient or surrogate, ordering and performing treatments and interventions, ordering and review of laboratory studies, ordering and review of radiographic studies, pulse oximetry and re-evaluation of patient's condition.     Everlene Balls, MD 08/06/14 [phone]

## 2014-08-06 NOTE — ED Notes (Signed)
CBG=278 

## 2014-08-06 NOTE — ED Notes (Signed)
Pt from home. Increased SOB x several days. Called EMS this am due to severe SOB. Pt's O2 sats 80% on RA. Pt. Placed on CPAP and given 1 nitro tablet for HTN. Pt's O2 sat on CPAP 98%. Pt denies CP at this time. Pt alert and oriented x 4, neuro intact.

## 2014-08-06 NOTE — ED Notes (Signed)
Meal tray ordered for pt  

## 2014-08-06 NOTE — ED Notes (Signed)
Bipap pulled per order, pt. In no distress @ this time, have titrated to 2 lpm n/c, RT to monitor.

## 2014-08-06 NOTE — ED Notes (Signed)
Meal tray given 

## 2014-08-06 NOTE — ED Notes (Signed)
Family at bedside. 

## 2014-08-07 ENCOUNTER — Encounter: Payer: Commercial Managed Care - HMO | Admitting: Internal Medicine

## 2014-08-07 ENCOUNTER — Inpatient Hospital Stay (HOSPITAL_COMMUNITY): Payer: PRIVATE HEALTH INSURANCE

## 2014-08-07 LAB — CBC
HCT: 38.2 % (ref 36.0–46.0)
Hemoglobin: 12 g/dL (ref 12.0–15.0)
MCH: 27.4 pg (ref 26.0–34.0)
MCHC: 31.4 g/dL (ref 30.0–36.0)
MCV: 87.2 fL (ref 78.0–100.0)
Platelets: 296 10*3/uL (ref 150–400)
RBC: 4.38 MIL/uL (ref 3.87–5.11)
RDW: 15.7 % — ABNORMAL HIGH (ref 11.5–15.5)
WBC: 8 10*3/uL (ref 4.0–10.5)

## 2014-08-07 LAB — BASIC METABOLIC PANEL
Anion gap: 16 — ABNORMAL HIGH (ref 5–15)
BUN: 15 mg/dL (ref 6–23)
CO2: 24 mEq/L (ref 19–32)
Calcium: 9 mg/dL (ref 8.4–10.5)
Chloride: 99 mEq/L (ref 96–112)
Creatinine, Ser: 0.67 mg/dL (ref 0.50–1.10)
GFR calc Af Amer: >90 mL/min (ref >90)
GFR calc non Af Amer: >90 mL/min (ref >90)
Glucose, Bld: 460 mg/dL — ABNORMAL HIGH (ref 70–99)
Potassium: 5 mEq/L (ref 3.7–5.3)
Sodium: 139 mEq/L (ref 137–147)

## 2014-08-07 LAB — GLUCOSE, CAPILLARY
Glucose-Capillary: 372 mg/dL — ABNORMAL HIGH (ref 70–99)
Glucose-Capillary: 273 mg/dL — ABNORMAL HIGH (ref 70–99)
Glucose-Capillary: 302 mg/dL — ABNORMAL HIGH (ref 70–99)
Glucose-Capillary: 135 mg/dL — ABNORMAL HIGH (ref 70–99)

## 2014-08-07 MED ORDER — PREDNISONE 50 MG PO TABS
50.0000 mg | ORAL_TABLET | Freq: Every day | ORAL | Status: DC
  Administered 2014-08-08: 50 mg via ORAL
  Filled 2014-08-07 (×3): qty 1

## 2014-08-07 MED ORDER — GUAIFENESIN-CODEINE 100-10 MG/5ML PO SOLN
5.0000 mL | Freq: Four times a day (QID) | ORAL | Status: DC | PRN
  Administered 2014-08-07: 5 mL via ORAL
  Filled 2014-08-07: qty 5

## 2014-08-07 MED ORDER — FUROSEMIDE 10 MG/ML IJ SOLN
40.0000 mg | Freq: Once | INTRAMUSCULAR | Status: AC
  Administered 2014-08-07: 40 mg via INTRAVENOUS
  Filled 2014-08-07: qty 4

## 2014-08-07 MED ORDER — INSULIN GLARGINE 100 UNIT/ML ~~LOC~~ SOLN
40.0000 [IU] | Freq: Every day | SUBCUTANEOUS | Status: DC
  Administered 2014-08-07: 40 [IU] via SUBCUTANEOUS
  Filled 2014-08-07 (×2): qty 0.4

## 2014-08-07 MED ORDER — FUROSEMIDE 20 MG PO TABS
20.0000 mg | ORAL_TABLET | Freq: Every day | ORAL | Status: DC
  Administered 2014-08-08: 20 mg via ORAL
  Filled 2014-08-07: qty 1

## 2014-08-07 MED ORDER — INSULIN ASPART 100 UNIT/ML ~~LOC~~ SOLN
0.0000 [IU] | Freq: Three times a day (TID) | SUBCUTANEOUS | Status: DC
  Administered 2014-08-07: 11 [IU] via SUBCUTANEOUS
  Administered 2014-08-07: 15 [IU] via SUBCUTANEOUS
  Administered 2014-08-08: 4 [IU] via SUBCUTANEOUS

## 2014-08-07 NOTE — Progress Notes (Signed)
Results for ECRIN, TAUSSIG (MRN [id number]) as of 08/07/2014 09:58  Ref. Range 08/06/2014 10:22 08/06/2014 13:03 08/06/2014 17:01 08/06/2014 20:26 08/07/2014 08:08  Glucose-Capillary Latest Range: 70-99 mg/dL 279 (H) 278 (H) 224 (H) 353 (H) 372 (H)   Admitted with SOB, edema, history of DM.  Currently on Lantus 26 units daily and Novolog MODERATE correction scale TID in the hospital.   Recommend increasing Lantus  close to home dosage of 44 units daily.  If CBGs continue to be greater than 180 mg/dl, increase Novolog correction scale to RESISTANT TID.  Harvel Ricks RN BSN CDE

## 2014-08-07 NOTE — Progress Notes (Signed)
Report called to [facility] on unit 6N. Patient to be transported to 6N via wheelchair.

## 2014-08-07 NOTE — Progress Notes (Signed)
Pulse oximetry placed. [name]

## 2014-08-07 NOTE — Progress Notes (Signed)
Subjective:  Patient was seen and examined this morning. Patient states that she feels her breathing is improving, but she is still wheezing, has a productive cough and feels more short of breath than her baseline. Patient denies fever or chills.  Objective: Vital signs in last 24 hours: Filed Vitals:   08/07/14 1155 08/07/14 1212 08/07/14 1233 08/07/14 1300  BP: 102/49 121/92 154/61 133/67  Pulse: 89 88 101 89  Temp: 98.8 F (37.1 C) 98.8 F (37.1 C) 98.8 F (37.1 C) 98.2 F (36.8 C)  TempSrc: Oral Oral Oral Oral  Resp: 22 24 30 22   Height:      Weight:      SpO2: 99% 100% 96% 100%   Weight change: 1.065 kg (2 lb 5.6 oz)  Intake/Output Summary (Last 24 hours) at 08/07/14 1456 Last data filed at 08/07/14 1147  Gross per 24 hour  Intake    240 ml  Output    700 ml  Net   -460 ml   General: Vital signs reviewed.  Patient is well-developed and well-nourished, in no acute distress and cooperative with exam.   Cardiovascular: RRR, S1 normal, S2 normal, no murmurs, gallops, or rubs. Pulmonary/Chest: Diffuse inspiratory and expiratory wheezes and mild inspiratory crackles in lower lung fields. Abdominal: Soft, non-tender, non-distended, BS +, no masses, organomegaly, or guarding present.  Musculoskeletal: No joint deformities, erythema, or stiffness, ROM full and nontender. Extremities: No lower extremity edema bilaterally,  pulses symmetric and intact bilaterally. No cyanosis or clubbing. Skin: Warm, dry and intact. No rashes or erythema. Psychiatric: Normal mood and affect. Speech and behavior is normal. Cognition and memory are normal.   Lab Results: Basic Metabolic Panel:  Recent Labs Lab 08/06/14 0308 08/06/14 0939 08/07/14 0234  NA 140  --  139  K 3.7  --  5.0  CL 101  --  99  CO2 23  --  24  GLUCOSE 306*  --  460*  BUN 8  --  15  CREATININE 0.61 0.68 0.67  CALCIUM 9.0  --  9.0   CBC:  Recent Labs Lab 08/06/14 0308 08/06/14 0939 08/07/14 0234  WBC 7.8  11.4* 8.0  NEUTROABS 4.8  --   --   HGB 13.2 12.9 12.0  HCT 40.8 41.2 38.2  MCV 85.0 87.7 87.2  PLT 281 230 296   Cardiac Enzymes:  Recent Labs Lab 08/06/14 0939 08/06/14 1520 08/06/14 2057  TROPONINI <0.30 <0.30 <0.30   BNP:  Recent Labs Lab 08/06/14 0308  PROBNP 216.7*   CBG:  Recent Labs Lab 08/06/14 1022 08/06/14 1303 08/06/14 1701 08/06/14 2026 08/07/14 0808 08/07/14 1308  GLUCAP 279* 278* 224* 353* 372* 273*   Thyroid Function Tests:  Recent Labs Lab 08/06/14 0939  TSH 0.415   Urine Drug Screen: Drugs of Abuse     Component Value Date/Time   LABOPIA NONE DETECTED 05/15/2014 1141   COCAINSCRNUR NONE DETECTED 05/15/2014 1141   LABBENZ NONE DETECTED 05/15/2014 1141   AMPHETMU NONE DETECTED 05/15/2014 1141   THCU NONE DETECTED 05/15/2014 1141   LABBARB NONE DETECTED 05/15/2014 1141    Micro Results: Recent Results (from the past 240 hour(s))  Culture, respiratory (NON-Expectorated)     Status: None (Preliminary result)   Collection Time: 08/06/14 11:39 AM  Result Value Ref Range Status   Specimen Description SPUTUM  Final   Special Requests NONE  Final   Gram Stain   Final    MODERATE WBC PRESENT,BOTH PMN AND MONONUCLEAR RARE SQUAMOUS  EPITHELIAL CELLS PRESENT RARE GRAM POSITIVE COCCI IN PAIRS Performed at [facility]    Culture   Final    NORMAL OROPHARYNGEAL FLORA Performed at [facility]    Report Status PENDING  Incomplete  MRSA PCR Screening     Status: None   Collection Time: 08/06/14  2:26 PM  Result Value Ref Range Status   MRSA by PCR NEGATIVE NEGATIVE Final    Comment:        The GeneXpert MRSA Assay (FDA approved for NASAL specimens only), is one component of a comprehensive MRSA colonization surveillance program. It is not intended to diagnose MRSA infection nor to guide or monitor treatment for MRSA infections.    Studies/Results: Dg Chest 2 View  08/07/2014   CLINICAL DATA:  Shortness of breath,  cough, congestion  EXAM: CHEST  2 VIEW  COMPARISON:  08/06/2014  FINDINGS: Mild patchy right upper lobe opacity, suspicious for pneumonia.  Mild patchy bibasilar opacities, likely atelectasis. No pleural effusion or pneumothorax.  Cardiomegaly.  Left subclavian ICD.  Mild degenerative changes of the visualized thoracolumbar spine.  IMPRESSION: Mild patchy right upper lobe opacity, suspicious for pneumonia.  Mild patchy bibasilar opacities, likely atelectasis.   Electronically Signed   By: Julian Hy M.D.   On: 08/07/2014 10:46   Dg Chest Port 1 View  08/06/2014   CLINICAL DATA:  Shortness of breath tonight.  EXAM: PORTABLE CHEST - 1 VIEW  COMPARISON:  07/12/2014  FINDINGS: Cardiac pacemaker. Shallow inspiration. Borderline heart size with normal pulmonary vascularity. No focal airspace disease or consolidation in the lungs. No blunting of costophrenic angles. No pneumothorax.  IMPRESSION: No active disease.   Electronically Signed   By: Lucienne Capers M.D.   On: 08/06/2014 03:23   Medications:  I have reviewed the patient's current medications. Prior to Admission:  Prescriptions prior to admission  Medication Sig Dispense Refill Last Dose  . albuterol (VENTOLIN HFA) 108 (90 BASE) MCG/ACT inhaler Inhale 2 puffs into the lungs every 6 (six) hours as needed. Shortness of breath 1 Inhaler 6 08/03/14  . aspirin EC 81 MG tablet Take 81 mg by mouth daily.   08/05/2014 at Unknown time  . atorvastatin (LIPITOR) 80 MG tablet Take 1 tablet (80 mg total) by mouth at bedtime. 30 tablet 11 08/05/2014 at Unknown time  . citalopram (CELEXA) 20 MG tablet Take 1 tablet (20 mg total) by mouth daily. 30 tablet 11 08/05/2014 at Unknown time  . gabapentin (NEURONTIN) 300 MG capsule Take 1 capsule (300 mg total) by mouth at bedtime. 30 capsule 11 08/05/2014 at Unknown time  . glucose blood (ONE TOUCH ULTRA TEST) test strip Use as instructed by doctor up to 3x daily,dx code 250.02 insulin requiring 100 each 5 08/05/2014  at Unknown time  . insulin glargine (LANTUS) 100 UNIT/ML injection Inject 44 Units into the skin daily with breakfast.   08/05/2014 at Unknown time  . insulin lispro (HUMALOG) 100 UNIT/ML injection Inject 8 units with breakfast and 16 units with lunch and 10 units with dinner 10 mL 5 08/05/2014 at Unknown time  . lisinopril (PRINIVIL,ZESTRIL) 10 MG tablet Take 1 tablet (10 mg total) by mouth daily. 14 tablet 0 08/05/2014 at Unknown time  . metoCLOPramide (REGLAN) 5 MG tablet Take 1 tablet (5 mg total) by mouth 3 (three) times daily before meals. 90 tablet 0 08/05/2014 at Unknown time  . metoprolol succinate (TOPROL-XL) 50 MG 24 hr tablet Take 1 tablet (50 mg total) by mouth  daily. Take with or immediately following a meal. 14 tablet 0 08/05/2014 at 1000  . nitroGLYCERIN (NITROSTAT) 0.4 MG SL tablet Place 0.4 mg under the tongue every 5 (five) minutes as needed for chest pain.   never  . pantoprazole (PROTONIX) 40 MG tablet Take 1 tablet (40 mg total) by mouth daily before breakfast. 30 tablet 0 08/05/2014 at Unknown time   Scheduled Meds: . amLODipine  10 mg Oral Daily  . aspirin EC  81 mg Oral Daily  . atorvastatin  80 mg Oral QHS  . citalopram  20 mg Oral Daily  . [START ON 08/08/2014] furosemide  20 mg Oral Daily  . gabapentin  300 mg Oral QHS  . heparin  5,000 Units Subcutaneous 3 times per day  . insulin aspart  0-20 Units Subcutaneous TID WC  . insulin glargine  40 Units Subcutaneous QHS  . ipratropium-albuterol  3 mL Nebulization Q4H  . levofloxacin (LEVAQUIN) IV  750 mg Intravenous Q24H  . lisinopril  10 mg Oral Daily  . metoCLOPramide  5 mg Oral TID AC  . metoprolol succinate  50 mg Oral Daily  . nicotine  7 mg Transdermal Daily  . pantoprazole  40 mg Oral QAC breakfast  . [START ON 08/08/2014] predniSONE  50 mg Oral Q breakfast  . sodium chloride  3 mL Intravenous Q12H   Continuous Infusions:  PRN Meds:.acetaminophen **OR** acetaminophen, albuterol, ondansetron **OR** ondansetron  (ZOFRAN) IV Assessment/Plan: Principal Problem:   Acute respiratory acidosis Active Problems:   TOBACCO ABUSE   HYPERTENSION, BENIGN ESSENTIAL   Chronic combined systolic and diastolic heart failure   COPD, moderate   Automatic implantable cardioverter-defibrillator in situ   Nonischemic cardiomyopathy   Type 2 diabetes mellitus not at goal  COPD Exacerbation Likely Secondary to HCAP: Last PFTs 10/2011 showed FEV/FEV1 76%, restrictive lung pattern with insignificant response to bronchodilator. Patient is on 2 L of oxygen at home and albuterol inhalers. This morning her shortness of breath is improving, but patient is still diffusely wheezing with crackles in lower lung fields. Repeat CXR shows mild patchy right upper lobe opacity, suspicious for pneumonia and mild patchy bibasilar opacities, likely atelectasis. Patient does have a risk factor for MDR HCAP since she was hospitalized within the preceding 90 days, but her recent stay has not been more than 5 days, she is not immunosuppressed nor was she in severe septic shock. Therefore, we can continue with Levaquin 750 mg daily.   -Transfer from SDU to Med/Surg -Heart Healthy Diet -Duonebs Q4H -Albuterol Nebulizer Q6H prn -Continue treatment with Levaquin 750 mg daily -Sputum culture -Strict I/Os -Daily Weights -Prednisone 50 mg daily -BMET tomorrow am  Acute HTN Urgency:Resolved, secondary to medication non-compliance. 121/92-154/61 today. Patient is on Lisinopril 10 mg daily and metoprolol 50 mg daily at home.  -Amlodipine 10 mg daily -Continue home lisinopril 10 mg daily -Continue home Metoprolol 50 mg daily  Chronic Combined Systolic and Diastolic Heart Failure: Last echo on 03/2014 revealed EF of 123456 Grade 1 diastolic dysfunction. proBNP slightly elevated at 216 which is much lower than previous readings. Repeat CXR shows mild patchy right upper lobe opacity, suspicious for pneumonia and mild patchy bibasilar opacities, likely  atelectasis. There was no signs of volume overload or vascular congestion. Crackles on examination may be due to atelectasis.  -Amlodipine 10 mg daily -Atorvastatin 80 mg daily -Lasix 40 mg IV once  -Start Lasix 20 mg po tomorrow -Lisinopril 10 mg daily -Metoprolol 50 mg daily  Type II  Uncontrolled Diabetes Mellitus with Gastroparesis: Last HgbA1c 12.7 on 07/12/14. Glucose is elevated in the 200-300s. Patient is on Lantus 44 units at home and Humalog 8 units with breakfast, 16 units with lunch and 10 units with dinner. -SSI-Resistant -Lantus 40 units QHS -CBG 4 times daily  Hypothyroidism: Last TSH 0.581 and patient is not on medication at this time.Repeat TSH is 0.415. -Monitor  DVT/PE ppx: Heparin SQ TID  Dispo: Disposition is deferred at this time, awaiting improvement of current medical problems.  Anticipated discharge in approximately 1-2 day(s).   The patient does have a current PCP (Aldine Contes, MD) and does need an [facility] hospital follow-up appointment after discharge.  The patient does not have transportation limitations that hinder transportation to clinic appointments.  .Services Needed at time of discharge: Y = Yes, Blank = No PT:   OT:   RN:   Equipment:   Other:     LOS: 1 day   Osa Craver, DO PGY-1 Internal Medicine Resident Pager # [phone] 08/07/2014 2:56 PM

## 2014-08-07 NOTE — Progress Notes (Signed)
Continuous pulse ox ordered. [facility] stated they did not have any pulse ox's at this time but will bring one when available.  Will continue to monitor. Syliva Overman

## 2014-08-08 DIAGNOSIS — Z95810 Presence of automatic (implantable) cardiac defibrillator: Secondary | ICD-10-CM

## 2014-08-08 DIAGNOSIS — I5042 Chronic combined systolic (congestive) and diastolic (congestive) heart failure: Secondary | ICD-10-CM

## 2014-08-08 DIAGNOSIS — Z720 Tobacco use: Secondary | ICD-10-CM

## 2014-08-08 DIAGNOSIS — I10 Essential (primary) hypertension: Secondary | ICD-10-CM

## 2014-08-08 DIAGNOSIS — J441 Chronic obstructive pulmonary disease with (acute) exacerbation: Principal | ICD-10-CM

## 2014-08-08 DIAGNOSIS — I429 Cardiomyopathy, unspecified: Secondary | ICD-10-CM

## 2014-08-08 DIAGNOSIS — E872 Acidosis: Secondary | ICD-10-CM

## 2014-08-08 DIAGNOSIS — E1165 Type 2 diabetes mellitus with hyperglycemia: Secondary | ICD-10-CM

## 2014-08-08 DIAGNOSIS — J189 Pneumonia, unspecified organism: Secondary | ICD-10-CM

## 2014-08-08 LAB — BASIC METABOLIC PANEL
Anion gap: 16 — ABNORMAL HIGH (ref 5–15)
BUN: 19 mg/dL (ref 6–23)
CO2: 27 mEq/L (ref 19–32)
Calcium: 9.4 mg/dL (ref 8.4–10.5)
Chloride: 99 mEq/L (ref 96–112)
Creatinine, Ser: 0.77 mg/dL (ref 0.50–1.10)
GFR calc Af Amer: >90 mL/min (ref >90)
GFR calc non Af Amer: 89 mL/min — ABNORMAL LOW (ref >90)
Glucose, Bld: 204 mg/dL — ABNORMAL HIGH (ref 70–99)
Potassium: 3.2 mEq/L — ABNORMAL LOW (ref 3.7–5.3)
Sodium: 142 mEq/L (ref 137–147)

## 2014-08-08 LAB — CULTURE, RESPIRATORY W GRAM STAIN: Culture: NORMAL

## 2014-08-08 LAB — GLUCOSE, CAPILLARY
Glucose-Capillary: 164 mg/dL — ABNORMAL HIGH (ref 70–99)
Glucose-Capillary: 311 mg/dL — ABNORMAL HIGH (ref 70–99)

## 2014-08-08 MED ORDER — POTASSIUM CHLORIDE CRYS ER 20 MEQ PO TBCR
40.0000 meq | EXTENDED_RELEASE_TABLET | Freq: Once | ORAL | Status: AC
  Administered 2014-08-08: 40 meq via ORAL
  Filled 2014-08-08: qty 2

## 2014-08-08 MED ORDER — LEVOFLOXACIN 750 MG PO TABS: 750.0000 mg | ORAL_TABLET | Freq: Every day | ORAL | Status: DC

## 2014-08-08 MED ORDER — GUAIFENESIN-CODEINE 100-10 MG/5ML PO SOLN: 5.0000 mL | Freq: Four times a day (QID) | ORAL | Status: DC | PRN

## 2014-08-08 MED ORDER — LEVOFLOXACIN 750 MG PO TABS
750.0000 mg | ORAL_TABLET | Freq: Every day | ORAL | Status: DC
  Administered 2014-08-08: 750 mg via ORAL
  Filled 2014-08-08: qty 1

## 2014-08-08 MED ORDER — FUROSEMIDE 20 MG PO TABS
20.0000 mg | ORAL_TABLET | Freq: Every day | ORAL | Status: DC
Start: 2014-08-08 — End: 2015-02-09

## 2014-08-08 MED ORDER — AMLODIPINE BESYLATE 10 MG PO TABS: 10.0000 mg | ORAL_TABLET | Freq: Every day | ORAL | Status: DC

## 2014-08-08 MED ORDER — PREDNISONE 50 MG PO TABS: ORAL_TABLET | ORAL | Status: DC

## 2014-08-08 NOTE — Discharge Instructions (Signed)
Thank you for allowing [place] to be involved in your healthcare while you were hospitalized at [facility].   Please note that there have been changes to your home medications.  --> PLEASE LOOK AT YOUR DISCHARGE MEDICATION LIST FOR DETAILS.  Please call your PCP if you have any questions or concerns, or any difficulty getting any of your medications.  Please return to the ER if you have worsening of your symptoms or new severe symptoms arise.  For your COPD and Congestive Heart Failure, please continue to take your amlodipine 10 mg daily, lisinopril 10 mg daily, metoprolol 50 mg daily and lasix 20 mg daily. I have sent prescriptions for this. Continue to use your oxygen and home nebulizers as you previously were.  Please follow up in clinic in Thursday, August 14, 2014 at 10:15 am with Dr. Eula Fried.  Chronic Obstructive Pulmonary Disease Chronic obstructive pulmonary disease (COPD) is a common lung condition in which airflow from the lungs is limited. COPD is a general term that can be used to describe many different lung problems that limit airflow, including both chronic bronchitis and emphysema. If you have COPD, your lung function will probably never return to normal, but there are measures you can take to improve lung function and make yourself feel better.  CAUSES   Smoking (common).   Exposure to secondhand smoke.   Genetic problems.  Chronic inflammatory lung diseases or recurrent infections. SYMPTOMS   Shortness of breath, especially with physical activity.   Deep, persistent (chronic) cough with a large amount of thick mucus.   Wheezing.   Rapid breaths (tachypnea).   Gray or bluish discoloration (cyanosis) of the skin, especially in fingers, toes, or lips.   Fatigue.   Weight loss.   Frequent infections or episodes when breathing symptoms become much worse (exacerbations).   Chest tightness. DIAGNOSIS  Your health care provider will  take a medical history and perform a physical examination to make the initial diagnosis. Additional tests for COPD may include:   Lung (pulmonary) function tests.  Chest X-ray.  CT scan.  Blood tests. TREATMENT  Treatment available to help you feel better when you have COPD includes:   Inhaler and nebulizer medicines. These help manage the symptoms of COPD and make your breathing more comfortable.  Supplemental oxygen. Supplemental oxygen is only helpful if you have a low oxygen level in your blood.   Exercise and physical activity. These are beneficial for nearly all people with COPD. Some people may also benefit from a pulmonary rehabilitation program. HOME CARE INSTRUCTIONS   Take all medicines (inhaled or pills) as directed by your health care provider.  Avoid over-the-counter medicines or cough syrups that dry up your airway (such as antihistamines) and slow down the elimination of secretions unless instructed otherwise by your health care provider.   If you are a smoker, the most important thing that you can do is stop smoking. Continuing to smoke will cause further lung damage and breathing trouble. Ask your health care provider for help with quitting smoking. He or she can direct you to community resources or hospitals that provide support.  Avoid exposure to irritants such as smoke, chemicals, and fumes that aggravate your breathing.  Use oxygen therapy and pulmonary rehabilitation if directed by your health care provider. If you require home oxygen therapy, ask your health care provider whether you should purchase a pulse oximeter to measure your oxygen level at home.   Avoid contact with individuals who  have a contagious illness.  Avoid extreme temperature and humidity changes.  Eat healthy foods. Eating smaller, more frequent meals and resting before meals may help you maintain your strength.  Stay active, but balance activity with periods of rest. Exercise and  physical activity will help you maintain your ability to do things you want to do.  Preventing infection and hospitalization is very important when you have COPD. Make sure to receive all the vaccines your health care provider recommends, especially the pneumococcal and influenza vaccines. Ask your health care provider whether you need a pneumonia vaccine.  Learn and use relaxation techniques to manage stress.  Learn and use controlled breathing techniques as directed by your health care provider. Controlled breathing techniques include:   Pursed lip breathing. Start by breathing in (inhaling) through your nose for 1 second. Then, purse your lips as if you were going to whistle and breathe out (exhale) through the pursed lips for 2 seconds.   Diaphragmatic breathing. Start by putting one hand on your abdomen just above your waist. Inhale slowly through your nose. The hand on your abdomen should move out. Then purse your lips and exhale slowly. You should be able to feel the hand on your abdomen moving in as you exhale.   Learn and use controlled coughing to clear mucus from your lungs. Controlled coughing is a series of short, progressive coughs. The steps of controlled coughing are:  1. Lean your head slightly forward.  2. Breathe in deeply using diaphragmatic breathing.  3. Try to hold your breath for 3 seconds.  4. Keep your mouth slightly open while coughing twice.  5. Spit any mucus out into a tissue.  6. Rest and repeat the steps once or twice as needed. SEEK MEDICAL CARE IF:   You are coughing up more mucus than usual.   There is a change in the color or thickness of your mucus.   Your breathing is more labored than usual.   Your breathing is faster than usual.  SEEK IMMEDIATE MEDICAL CARE IF:   You have shortness of breath while you are resting.   You have shortness of breath that prevents you from:  Being able to talk.   Performing your usual physical  activities.   You have chest pain lasting longer than 5 minutes.   Your skin color is more cyanotic than usual.  You measure low oxygen saturations for longer than 5 minutes with a pulse oximeter. MAKE SURE YOU:   Understand these instructions.  Will watch your condition.  Will get help right away if you are not doing well or get worse. Document Released: 06/29/2005 Document Revised: 02/03/2014 Document Reviewed: 05/16/2013 [facility] Patient Information 2015 [place], [place]. This information is not intended to replace advice given to you by your health care provider. Make sure you discuss any questions you have with your health care provider.

## 2014-08-08 NOTE — Progress Notes (Signed)
Subjective:  Patient was seen and examined this morning. Patient states she is doing well, she states her breathing is much better. She does not feel short of breath. She does have an occasional cough. Patient denies fever, chills, chest pain, trouble breathing, dizziness, weakness, nausea or vomiting. She has been eating and sleeping well.  Objective: Vital signs in last 24 hours: Filed Vitals:   08/07/14 2141 08/08/14 0203 08/08/14 0509 08/08/14 0739  BP: 164/69 159/70 155/73   Pulse: 90 84 84   Temp: 97.6 F (36.4 C) 98.7 F (37.1 C) 97.6 F (36.4 C)   TempSrc: Oral Oral Oral   Resp: 19 19 18    Height:      Weight:   103.1 kg (227 lb 4.7 oz)   SpO2: 94% 97% 98% 97%   Weight change: -3.2 kg (-7 lb 0.9 oz)  Intake/Output Summary (Last 24 hours) at 08/08/14 0815 Last data filed at 08/08/14 0717  Gross per 24 hour  Intake    460 ml  Output    525 ml  Net    -65 ml   General: Vital signs reviewed.  Patient is well-developed and well-nourished, in no acute distress and cooperative with exam.   Cardiovascular: RRR, S1 normal, S2 normal, no murmurs, gallops, or rubs. Pulmonary/Chest: Inspiratory wheezes without rhonchi. Abdominal: Soft, non-tender, non-distended, BS +, no masses, organomegaly, or guarding present.  Musculoskeletal: No joint deformities, erythema, or stiffness, ROM full and nontender. Extremities: No lower extremity edema bilaterally,  pulses symmetric and intact bilaterally. No cyanosis or clubbing. Skin: Warm, dry and intact. No rashes or erythema. Psychiatric: Normal mood and affect. Speech and behavior is normal. Cognition and memory are normal.   Lab Results: Basic Metabolic Panel:  Recent Labs Lab 08/07/14 0234 08/08/14 0513  NA 139 142  K 5.0 3.2*  CL 99 99  CO2 24 27  GLUCOSE 460* 204*  BUN 15 19  CREATININE 0.67 0.77  CALCIUM 9.0 9.4   CBC:  Recent Labs Lab 08/06/14 0308 08/06/14 0939 08/07/14 0234  WBC 7.8 11.4* 8.0  NEUTROABS 4.8   --   --   HGB 13.2 12.9 12.0  HCT 40.8 41.2 38.2  MCV 85.0 87.7 87.2  PLT 281 230 296   Cardiac Enzymes:  Recent Labs Lab 08/06/14 0939 08/06/14 1520 08/06/14 2057  TROPONINI <0.30 <0.30 <0.30   BNP:  Recent Labs Lab 08/06/14 0308  PROBNP 216.7*   CBG:  Recent Labs Lab 08/06/14 2026 08/07/14 0808 08/07/14 1308 08/07/14 1616 08/07/14 2140 08/08/14 0745  GLUCAP 353* 372* 273* 302* 135* 164*   Thyroid Function Tests:  Recent Labs Lab 08/06/14 0939  TSH 0.415   Urine Drug Screen: Drugs of Abuse     Component Value Date/Time   LABOPIA NONE DETECTED 05/15/2014 1141   COCAINSCRNUR NONE DETECTED 05/15/2014 1141   LABBENZ NONE DETECTED 05/15/2014 1141   AMPHETMU NONE DETECTED 05/15/2014 1141   THCU NONE DETECTED 05/15/2014 1141   LABBARB NONE DETECTED 05/15/2014 1141    Micro Results: Recent Results (from the past 240 hour(s))  Culture, respiratory (NON-Expectorated)     Status: None (Preliminary result)   Collection Time: 08/06/14 11:39 AM  Result Value Ref Range Status   Specimen Description SPUTUM  Final   Special Requests NONE  Final   Gram Stain   Final    MODERATE WBC PRESENT,BOTH PMN AND MONONUCLEAR RARE SQUAMOUS EPITHELIAL CELLS PRESENT RARE GRAM POSITIVE COCCI IN PAIRS Performed at [facility]  Culture   Final    NORMAL OROPHARYNGEAL FLORA Performed at [facility]    Report Status PENDING  Incomplete  MRSA PCR Screening     Status: None   Collection Time: 08/06/14  2:26 PM  Result Value Ref Range Status   MRSA by PCR NEGATIVE NEGATIVE Final    Comment:        The GeneXpert MRSA Assay (FDA approved for NASAL specimens only), is one component of a comprehensive MRSA colonization surveillance program. It is not intended to diagnose MRSA infection nor to guide or monitor treatment for MRSA infections.    Studies/Results: Dg Chest 2 View  08/07/2014   CLINICAL DATA:  Shortness of breath, cough, congestion  EXAM:  CHEST  2 VIEW  COMPARISON:  08/06/2014  FINDINGS: Mild patchy right upper lobe opacity, suspicious for pneumonia.  Mild patchy bibasilar opacities, likely atelectasis. No pleural effusion or pneumothorax.  Cardiomegaly.  Left subclavian ICD.  Mild degenerative changes of the visualized thoracolumbar spine.  IMPRESSION: Mild patchy right upper lobe opacity, suspicious for pneumonia.  Mild patchy bibasilar opacities, likely atelectasis.   Electronically Signed   By: Julian Hy M.D.   On: 08/07/2014 10:46   Medications:  I have reviewed the patient's current medications. Prior to Admission:  Prescriptions prior to admission  Medication Sig Dispense Refill Last Dose  . albuterol (VENTOLIN HFA) 108 (90 BASE) MCG/ACT inhaler Inhale 2 puffs into the lungs every 6 (six) hours as needed. Shortness of breath 1 Inhaler 6 08/03/14  . aspirin EC 81 MG tablet Take 81 mg by mouth daily.   08/05/2014 at Unknown time  . atorvastatin (LIPITOR) 80 MG tablet Take 1 tablet (80 mg total) by mouth at bedtime. 30 tablet 11 08/05/2014 at Unknown time  . citalopram (CELEXA) 20 MG tablet Take 1 tablet (20 mg total) by mouth daily. 30 tablet 11 08/05/2014 at Unknown time  . gabapentin (NEURONTIN) 300 MG capsule Take 1 capsule (300 mg total) by mouth at bedtime. 30 capsule 11 08/05/2014 at Unknown time  . glucose blood (ONE TOUCH ULTRA TEST) test strip Use as instructed by doctor up to 3x daily,dx code 250.02 insulin requiring 100 each 5 08/05/2014 at Unknown time  . insulin glargine (LANTUS) 100 UNIT/ML injection Inject 44 Units into the skin daily with breakfast.   08/05/2014 at Unknown time  . insulin lispro (HUMALOG) 100 UNIT/ML injection Inject 8 units with breakfast and 16 units with lunch and 10 units with dinner 10 mL 5 08/05/2014 at Unknown time  . lisinopril (PRINIVIL,ZESTRIL) 10 MG tablet Take 1 tablet (10 mg total) by mouth daily. 14 tablet 0 08/05/2014 at Unknown time  . metoCLOPramide (REGLAN) 5 MG tablet Take 1  tablet (5 mg total) by mouth 3 (three) times daily before meals. 90 tablet 0 08/05/2014 at Unknown time  . metoprolol succinate (TOPROL-XL) 50 MG 24 hr tablet Take 1 tablet (50 mg total) by mouth daily. Take with or immediately following a meal. 14 tablet 0 08/05/2014 at 1000  . nitroGLYCERIN (NITROSTAT) 0.4 MG SL tablet Place 0.4 mg under the tongue every 5 (five) minutes as needed for chest pain.   never  . pantoprazole (PROTONIX) 40 MG tablet Take 1 tablet (40 mg total) by mouth daily before breakfast. 30 tablet 0 08/05/2014 at Unknown time   Scheduled Meds: . amLODipine  10 mg Oral Daily  . aspirin EC  81 mg Oral Daily  . atorvastatin  80 mg Oral QHS  . citalopram  20 mg Oral Daily  . furosemide  20 mg Oral Daily  . gabapentin  300 mg Oral QHS  . heparin  5,000 Units Subcutaneous 3 times per day  . insulin aspart  0-20 Units Subcutaneous TID WC  . insulin glargine  40 Units Subcutaneous QHS  . ipratropium-albuterol  3 mL Nebulization Q4H  . levofloxacin  750 mg Oral Daily  . lisinopril  10 mg Oral Daily  . metoCLOPramide  5 mg Oral TID AC  . metoprolol succinate  50 mg Oral Daily  . nicotine  7 mg Transdermal Daily  . pantoprazole  40 mg Oral QAC breakfast  . potassium chloride  40 mEq Oral Once  . predniSONE  50 mg Oral Q breakfast  . sodium chloride  3 mL Intravenous Q12H   Continuous Infusions:  PRN Meds:.acetaminophen **OR** acetaminophen, albuterol, guaiFENesin-codeine, ondansetron **OR** ondansetron (ZOFRAN) IV Assessment/Plan: Principal Problem:   Acute respiratory acidosis Active Problems:   TOBACCO ABUSE   HYPERTENSION, BENIGN ESSENTIAL   Chronic combined systolic and diastolic heart failure   COPD, moderate   Automatic implantable cardioverter-defibrillator in situ   Nonischemic cardiomyopathy   Type 2 diabetes mellitus not at goal  COPD Exacerbation Likely Secondary to HCAP: Yesterday, repeat CXR showed mild patchy right upper lobe opacity, suspicious for pneumonia  and mild patchy bibasilar opacities, likely atelectasis. Patient was covered for HCAP with levaquin already and we can continue current treatment. Patient feels much better and states she would be fine with going home to day. She is back on her normal oxygen level at home of 2L. She has a nebulizer machine and oxygen at home. Likely discharge to home this afternoon. -Heart Healthy Diet -Duonebs Q4H -Albuterol Nebulizer Q6H prn -Continue treatment with Levaquin 750 mg daily po (Day #3) -Sputum culture -Strict I/Os -Daily Weights -Prednisone 50 mg daily (Day #3) -Ambulatory saturations  HTN:  Patient has been in the [id number] systolic. I will be cautious with changing her medications as her blood pressure dropped during the last hospital admission causing AKI. Patient will be discharged on the medications below and will follow up in clinic in one week.   -Amlodipine 10 mg daily -Continue home lisinopril 10 mg daily -Continue home Metoprolol 50 mg daily -Lasix 20 mg po daily  Chronic Combined Systolic and Diastolic Heart Failure: Last echo on 03/2014 revealed EF of [id number] Grade 1 diastolic dysfunction. proBNP slightly elevated at 216 which is much lower than previous readings. Repeat CXR shows mild patchy right upper lobe opacity, suspicious for pneumonia and mild patchy bibasilar opacities, likely atelectasis. We will add back lasix 20 mg po daily. -Amlodipine 10 mg daily -Atorvastatin 80 mg daily -Lasix 40 mg IV once  -Lasix 20 mg po daily -Lisinopril 10 mg daily -Metoprolol 50 mg daily  Type II Uncontrolled Diabetes Mellitus with Gastroparesis: Last HgbA1c 12.7 on 07/12/14. Patient is on Lantus 44 units at home and Humalog 8 units with breakfast, 16 units with lunch and 10 units with dinner. After insulin change yesterday, glucose has improved to the 130s-160s today. -SSI-Resistant -Lantus 40 units QHS -CBG 4 times daily  DVT/PE ppx: Heparin SQ TID  Dispo: Disposition is deferred at  this time, awaiting improvement of current medical problems.  Anticipated discharge in approximately 1-2 day(s).   The patient does have a current PCP (Aldine Contes, MD) and does need an [facility] hospital follow-up appointment after discharge.  The patient does not have transportation limitations that hinder transportation to clinic appointments.  .Services  Needed at time of discharge: Y = Yes, Blank = No PT:   OT:   RN:   Equipment:   Other:     LOS: 2 days   Osa Craver, DO PGY-1 Internal Medicine Resident Pager # [phone] 08/08/2014 8:15 AM

## 2014-08-08 NOTE — Progress Notes (Signed)
Patient's oxygen saturation while sitting was 93-94% room air. Ambulating the whole unit patient's oxygen saturations were 87-91% room air. Patient's  O2 saturations return back to 94% room air after ambulating.

## 2014-08-08 NOTE — Progress Notes (Signed)
AVS discharge instructions were reviewed with patient. Patient was given prescription for guafensin to take to her pharmacy. Patient was  also reminded to pick up prednisone and levaquin at her pharmacy. Patient stated that she did not have any questions. Volunteers will assist patient to her transportation.

## 2014-08-08 NOTE — Discharge Summary (Signed)
Name: [name] MRN: [id number] DOB: 1951/08/27 63 y.o. PCP: Aldine Contes, MD  Date of Admission: 08/06/2014  2:47 AM Date of Discharge: 08/08/2014 Attending Physician: Campbell Riches, MD  Discharge Diagnosis:  Principal Problem:   COPD exacerbation Active Problems:   TOBACCO ABUSE   HYPERTENSION, BENIGN ESSENTIAL   Chronic combined systolic and diastolic heart failure   Automatic implantable cardioverter-defibrillator in situ   Nonischemic cardiomyopathy   Type 2 diabetes mellitus not at goal   Acute respiratory acidosis  Discharge Medications:   Medication List    TAKE these medications        albuterol 108 (90 BASE) MCG/ACT inhaler  Commonly known as:  VENTOLIN HFA  Inhale 2 puffs into the lungs every 6 (six) hours as needed. Shortness of breath     amLODipine 10 MG tablet  Commonly known as:  NORVASC  Take 1 tablet (10 mg total) by mouth daily.     aspirin EC 81 MG tablet  Take 81 mg by mouth daily.     atorvastatin 80 MG tablet  Commonly known as:  LIPITOR  Take 1 tablet (80 mg total) by mouth at bedtime.     citalopram 20 MG tablet  Commonly known as:  CELEXA  Take 1 tablet (20 mg total) by mouth daily.     furosemide 20 MG tablet  Commonly known as:  LASIX  Take 1 tablet (20 mg total) by mouth daily.     gabapentin 300 MG capsule  Commonly known as:  NEURONTIN  Take 1 capsule (300 mg total) by mouth at bedtime.     glucose blood test strip  Commonly known as:  ONE TOUCH ULTRA TEST  Use as instructed by doctor up to 3x daily,dx code 250.02 insulin requiring     guaiFENesin-codeine 100-10 MG/5ML syrup  Take 5 mLs by mouth every 6 (six) hours as needed for cough.     insulin glargine 100 UNIT/ML injection  Commonly known as:  LANTUS  Inject 44 Units into the skin daily with breakfast.     insulin lispro 100 UNIT/ML injection  Commonly known as:  HUMALOG  Inject 8 units with breakfast and 16 units with lunch and 10 units with dinner     levofloxacin 750 MG tablet  Commonly known as:  LEVAQUIN  Take 1 tablet (750 mg total) by mouth daily.     lisinopril 10 MG tablet  Commonly known as:  PRINIVIL,ZESTRIL  Take 1 tablet (10 mg total) by mouth daily.     metoCLOPramide 5 MG tablet  Commonly known as:  REGLAN  Take 1 tablet (5 mg total) by mouth 3 (three) times daily before meals.     metoprolol succinate 50 MG 24 hr tablet  Commonly known as:  TOPROL-XL  Take 1 tablet (50 mg total) by mouth daily. Take with or immediately following a meal.     nitroGLYCERIN 0.4 MG SL tablet  Commonly known as:  NITROSTAT  Place 0.4 mg under the tongue every 5 (five) minutes as needed for chest pain.     pantoprazole 40 MG tablet  Commonly known as:  PROTONIX  Take 1 tablet (40 mg total) by mouth daily before breakfast.     predniSONE 50 MG tablet  Commonly known as:  DELTASONE  Take 1 table by mouth once a day for two more days        Disposition and follow-up:   [name] was discharged from [facility] in Good condition.  At the hospital follow up visit please address:  1.  COPD/?HCAP: Please address resolution of symptoms of cough and shortness of breath, assess oxygen usage at home, and completion of steroids and antibiotics.   Type II DM: Please address glucose control at home. Patient had hyperglycemia during admission from 130-300s on SSI-resistant and Lantus 40 units QHS, likely elevated with steroid treatments.  HTN: Patient was mildly elevated at 130-150s. Patient had hypotension during PREVIOUS admission so we were hesitant to make big adjustments while on steroid treatment. Please assess blood pressure and make necessary adjustments.  2.  Labs / imaging needed at time of follow-up: BMET  3.  Pending labs/ test needing follow-up: None  Follow-up Appointments: Follow-up Information    Follow up with Jerene Pitch, MD On 08/14/2014.   Specialty:  Internal Medicine   Why:  10:15 am    Contact information:   [address] [place] [address] [phone]       Discharge Instructions: Discharge Instructions    (HEART FAILURE PATIENTS) Call MD:  Anytime you have any of the following symptoms: 1) 3 pound weight gain in 24 hours or 5 pounds in 1 week 2) shortness of breath, with or without a dry hacking cough 3) swelling in the hands, feet or stomach 4) if you have to sleep on extra pillows at night in order to breathe.    Complete by:  As directed      Call MD for:  difficulty breathing, headache or visual disturbances    Complete by:  As directed      Call MD for:  extreme fatigue    Complete by:  As directed      Call MD for:  persistant dizziness or light-headedness    Complete by:  As directed      Call MD for:  temperature >100.4    Complete by:  As directed      Diet - low sodium heart healthy    Complete by:  As directed      Increase activity slowly    Complete by:  As directed            Consultations:    Procedures Performed:  Dg Chest 2 View  08/07/2014   CLINICAL DATA:  Shortness of breath, cough, congestion  EXAM: CHEST  2 VIEW  COMPARISON:  08/06/2014  FINDINGS: Mild patchy right upper lobe opacity, suspicious for pneumonia.  Mild patchy bibasilar opacities, likely atelectasis. No pleural effusion or pneumothorax.  Cardiomegaly.  Left subclavian ICD.  Mild degenerative changes of the visualized thoracolumbar spine.  IMPRESSION: Mild patchy right upper lobe opacity, suspicious for pneumonia.  Mild patchy bibasilar opacities, likely atelectasis.   Electronically Signed   By: Julian Hy M.D.   On: 08/07/2014 10:46   US Renal  07/15/2014   CLINICAL DATA:  Diabetes.  Hypertension.  EXAM: RENAL/URINARY TRACT ULTRASOUND COMPLETE  COMPARISON:  CT 01/17/2012.  FINDINGS: Right Kidney:  Length: 10.7 cm. Echogenicity within normal limits. No mass or hydronephrosis visualized.  Left Kidney:  Length: 10.7 cm. Echogenicity within normal limits. No mass or  hydronephrosis visualized.  Bladder:  Appears normal for degree of bladder distention.  IMPRESSION: Normal exam.   Electronically Signed   By: Marcello Moores  Register   On: 07/15/2014 09:54   Dg Chest Port 1 View  08/06/2014   CLINICAL DATA:  Shortness of breath tonight.  EXAM: PORTABLE CHEST - 1 VIEW  COMPARISON:  07/12/2014  FINDINGS: Cardiac pacemaker. Shallow inspiration. Borderline heart size with normal pulmonary vascularity. No focal airspace disease or consolidation in the lungs. No blunting of costophrenic angles. No pneumothorax.  IMPRESSION: No active disease.   Electronically Signed   By: Lucienne Capers M.D.   On: 08/06/2014 03:23   Dg Abd Acute W/chest  07/12/2014   CLINICAL DATA:  Initial evaluation for vomiting  EXAM: ACUTE ABDOMEN SERIES (ABDOMEN 2 VIEW & CHEST 1 VIEW)  COMPARISON:  Prior study from 08/31/2012  FINDINGS: Single lead left-sided transvenous pacemaker/AICD is unchanged. Cardiomegaly is stable. Mediastinal silhouette remains within normal limits.  There is subtle patchy infiltrate within the right upper lobe, which may reflect mild asymmetric edema or possibly infiltrate. Lungs are otherwise clear. No pneumothorax.  No free air seen on lateral tube in is dissection. Visualized bowel gas pattern is nonobstructive. No abnormal bowel wall thickening. No soft tissue mass or abnormal calcification.  No acute osseus abnormality. Degenerative changes noted within the lower lumbar spine about the bilateral hips.  IMPRESSION: 1. Nonobstructive bowel gas pattern with no radiographic evidence of acute intra-abdominal abnormality. 2. Mild asymmetric opacity within the right upper lobe. Finding is nonspecific, and may reflect asymmetric edema versus infiltrate. Asymmetric pulmonary edema involving the right upper lobe can be seen with underlying mitral valve disease.   Electronically Signed   By: Jeannine Boga M.D.   On: 07/12/2014 04:54   Admission HPI: The patient was brought in by EMS  in respiratory distress sats in 80s on RA and was put on CPAP and then BIPAP in ED with significant improvement. During this interview pt had been weaned to 3L [place]. The pt states that for the past 2 wks she has had increased dyspnea. She states that she stopped taking all her BP meds except her lisinopril for the past 2 wks (but was also prescribed a BB but didn't take it) because of kidney problems (upon chart review pt had amlodipine, lasix, spironolactone all discontinued 07/14/14). She continued to have periods of LE edema and worsening dyspnea but not associated with any CP, HA, syncope, or palpitations. She denied any PND and was using 2L home O2 as usual. She has recently noticed a cough that is intermittently productive of white sputum, she denied any fever, chills, sick contacts, rash, abdominal pain, nausea/vomiting/diarrhea, and no ICD firing. The patient usually smokes 3-5 cigarettes daily but hasn't been able to for past 3 days given significant dyspnea.   Hospital Course by problem list: Principal Problem:   COPD exacerbation Active Problems:   TOBACCO ABUSE   HYPERTENSION, BENIGN ESSENTIAL   Chronic combined systolic and diastolic heart failure   Automatic implantable cardioverter-defibrillator in situ   Nonischemic cardiomyopathy   Type 2 diabetes mellitus not at goal   Acute respiratory acidosis   Acute on Chronic COPD Exacerbation with superimposed HCAP: Likely multifactorial in setting of COPD, recent hospitalization and change in medications. ABG revealed an acute respiratory acidosis given normal HCO3 and increased CO2. First CXR was without infiltrate. Patient does meet COPD exacerbation criteria with increased dyspnea and sputum production. Wells score was low risk and [place] was moderate Last PFTs 10/2011 showed FEV/FEV1 76%, with restrictive lung pattern with insignificant response to bronchodilator. Patient was originally treated with azithromycin in the ED which was  transitioned to [place]. Patient also received duoneb treatments, albuterol, and prednisone. Patient is on 2 L of oxygen at home and albuterol inhalers. Repeat CXR shows mild patchy right upper lobe opacity, suspicious for  pneumonia and mild patchy bibasilar opacities, likely atelectasis. Patient is at risk for HCAP since she was hospitalized within the preceding 90 days, but her recent stay has not been more than 5 days, she is not immunosuppressed nor was she in severe septic shock so we are not concerned for MDR HCAP. Therefore, we continued with Levaquin 750 mg daily.Patient greatly improved over the course of 3 days and was back on her home oxygen level of 2 L. We discharged her home with Prednisone 20 mg daily for 2 more days, Levaquin 750 mg daily for 4 more days, continue home duoneb treatments, and use her home oxygen.   Acute HTN urgency: Patient's blood pressure was initially elevated with SBP in the 200s likely 2/2 medication changes and poor patient medical literacy. During last hospitalization pt developed ATN in the setting of hypotension and therefore was only continued on ACEI and BB but patient had only been compliant with the ACEI. We treated her with and discharged her home on amlodipine 10 mg daily, lisinopril 10 mg daily, metoprolol 50 mg daily and lasix 20 mg daily. Patient's blood pressure remained in the [id number] systolic, which may be elevated due to steroids. Please reassess blood pressure and consider further adjustment as necessary.   Chronic diastolic heart failure: Patient's last echo was in 03/2014 EF 35-40% and Grade 1 diastolic dysfunction. Last ICD check was on 7/15 which didn't reveal any VT/VF events. Pro-BNP was slightly elevated at 216 which was much lower than her previous readings. CXR didn't show vascular congestion and patient did not look volume overloaded on exam. We treated her with and discharged her home on amlodipine 10 mg daily, lisinopril 10 mg daily,  metoprolol 50 mg daily and lasix 20 mg daily.  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 Patient is on Lantus 44 units at home and Humalog 8 units with breakfast, 16 units with lunch and 10 units with dinner. Please address glucose control at home. Patient had hyperglycemia during admission from 130-300s on SSI-resistant and Lantus 40 units QHS, likely elevated with steroid treatments.  Hypokalemia: Patient was hypokalemic at 3.2 likely secondary to duoneb treatments. We replaced her with KDur 40 mEq. Please recheck her BMET at follow up visit.  Discharge Vitals:   BP 147/74 mmHg  Pulse 84  Temp(Src) 97.6 F (36.4 C) (Oral)  Resp 18  Ht 5' (1.524 m)  Wt 103.1 kg (227 lb 4.7 oz)  BMI 44.39 kg/m2  SpO2 97%  Discharge Labs:  Results for orders placed or performed during the hospital encounter of 08/06/14 (from the past 24 hour(s))  Glucose, capillary     Status: Abnormal   Collection Time: 08/07/14  4:16 PM  Result Value Ref Range   Glucose-Capillary 302 (H) 70 - 99 mg/dL  Glucose, capillary     Status: Abnormal   Collection Time: 08/07/14  9:40 PM  Result Value Ref Range   Glucose-Capillary 135 (H) 70 - 99 mg/dL   Comment 1 Notify RN   Basic metabolic panel     Status: Abnormal   Collection Time: 08/08/14  5:13 AM  Result Value Ref Range   Sodium 142 137 - 147 mEq/L   Potassium 3.2 (L) 3.7 - 5.3 mEq/L   Chloride 99 96 - 112 mEq/L   CO2 27 19 - 32 mEq/L   Glucose, Bld 204 (H) 70 - 99 mg/dL   BUN 19 6 - 23 mg/dL   Creatinine, Ser  0.77 0.50 - 1.10 mg/dL   Calcium 9.4 8.4 - 10.5 mg/dL   GFR calc non Af Amer 89 (L) >90 mL/min   GFR calc Af Amer >90 >90 mL/min   Anion gap 16 (H) 5 - 15  Glucose, capillary     Status: Abnormal   Collection Time: 08/08/14  7:45 AM  Result Value Ref Range   Glucose-Capillary 164 (H) 70 - 99 mg/dL  Glucose, capillary     Status: Abnormal   Collection Time: 08/08/14 11:51 AM  Result  Value Ref Range   Glucose-Capillary 311 (H) 70 - 99 mg/dL    Signed: Osa Craver, DO PGY-1 Internal Medicine Resident Pager # [phone] 08/08/2014 1:08 PM

## 2014-08-08 NOTE — Plan of Care (Signed)
Problem: Discharge Progression Outcomes Goal: Dyspnea controlled Outcome: Adequate for Discharge Goal: O2 sats > or equal 90% or at baseline Outcome: Adequate for Discharge Goal: Home O2 if indicated Outcome: Adequate for Discharge Pt uses oxygen at home Goal: Able to self administer respiratory meds Outcome: Adequate for Discharge Goal: Independent ADLs or Home Health Care Outcome: Adequate for Discharge Goal: Discharge plan in place and appropriate Outcome: Adequate for Discharge Goal: Pain controlled with appropriate interventions Outcome: Adequate for Discharge Goal: Tolerating diet Outcome: Adequate for Discharge Goal: Activity appropriate for discharge plan Outcome: Adequate for Discharge Goal: Hemodynamically stable Outcome: Adequate for Discharge

## 2014-08-08 NOTE — Plan of Care (Signed)
Problem: Phase I Progression Outcomes Goal: O2 sats > or equal 90% or at baseline Outcome: Progressing Goal: Dyspnea controlled at rest Outcome: Progressing Goal: Pain controlled Outcome: Completed/Met Date Met:  08/08/14 Today patient denies any pain Goal: Progress activity as tolerated unless otherwise ordered Outcome: Progressing Goal: Discharge plan established Outcome: Progressing Goal: Tolerating diet Outcome: Progressing  Problem: Phase II Progression Outcomes Goal: O2 sats > equal to 90% on RA or at baseline Outcome: Progressing Goal: Pain controlled on oral analgesia Outcome: Adequate for Discharge Goal: Dyspnea controlled w/progressive activity Outcome: Progressing Goal: ADLs completed with minimal assistance Outcome: Progressing Goal: Activity at appropriate level-compared to baseline (UP IN CHAIR FOR HEMODIALYSIS)  Outcome: Progressing Goal: Tolerating diet Outcome: Progressing Goal: Discharge plan remains appropriate-arrangements made Outcome: Progressing  Problem: Discharge Progression Outcomes Goal: Dyspnea controlled Outcome: Progressing Goal: O2 sats > or equal 90% or at baseline Outcome: Progressing Goal: Home O2 if indicated Outcome: Progressing Patient states that she uses oxygen at home during the night.

## 2014-08-14 ENCOUNTER — Ambulatory Visit (INDEPENDENT_AMBULATORY_CARE_PROVIDER_SITE_OTHER): Payer: PRIVATE HEALTH INSURANCE | Admitting: Internal Medicine

## 2014-08-14 ENCOUNTER — Encounter: Payer: Self-pay | Admitting: Internal Medicine

## 2014-08-14 VITALS — BP 159/86 | HR 90 | Temp 97.7°F | Wt 232.0 lb

## 2014-08-14 DIAGNOSIS — Z720 Tobacco use: Secondary | ICD-10-CM

## 2014-08-14 DIAGNOSIS — F17200 Nicotine dependence, unspecified, uncomplicated: Secondary | ICD-10-CM

## 2014-08-14 DIAGNOSIS — I10 Essential (primary) hypertension: Secondary | ICD-10-CM

## 2014-08-14 DIAGNOSIS — E119 Type 2 diabetes mellitus without complications: Secondary | ICD-10-CM

## 2014-08-14 DIAGNOSIS — Z0000 Encounter for general adult medical examination without abnormal findings: Secondary | ICD-10-CM

## 2014-08-14 DIAGNOSIS — J189 Pneumonia, unspecified organism: Secondary | ICD-10-CM

## 2014-08-14 LAB — BASIC METABOLIC PANEL WITH GFR
BUN: 20 mg/dL (ref 6–23)
CO2: 31 mEq/L (ref 19–32)
Calcium: 9.7 mg/dL (ref 8.4–10.5)
Chloride: 100 mEq/L (ref 96–112)
Creat: 0.95 mg/dL (ref 0.50–1.10)
GFR, Est African American: 75 mL/min
GFR, Est Non African American: 65 mL/min
Glucose, Bld: 177 mg/dL — ABNORMAL HIGH (ref 70–99)
Potassium: 4 mEq/L (ref 3.5–5.3)
Sodium: 141 mEq/L (ref 135–145)

## 2014-08-14 LAB — GLUCOSE, CAPILLARY: Glucose-Capillary: 167 mg/dL — ABNORMAL HIGH (ref 70–99)

## 2014-08-14 MED ORDER — LISINOPRIL 10 MG PO TABS: 10.0000 mg | ORAL_TABLET | Freq: Every day | ORAL | Status: DC

## 2014-08-14 MED ORDER — METOPROLOL SUCCINATE ER 50 MG PO TB24: 50.0000 mg | ORAL_TABLET | Freq: Every day | ORAL | Status: DC

## 2014-08-14 NOTE — Assessment & Plan Note (Signed)
Will need to give prescription for zostavax if covered by insurance on next visit

## 2014-08-14 NOTE — Assessment & Plan Note (Addendum)
Lab Results  Component Value Date   HGBA1C 12.7* 07/12/2014   HGBA1C 10.9 04/08/2014   HGBA1C 10.0 10/08/2013    Assessment: Diabetes control: poor control (HgbA1C >9%) Progress toward A1C goal:  unable to assess Comments: cbgs appear much improved per patient discussion, also losing weight  Plan: Medications:  continue current medications lantus 44 units qhs and humalog 8 units in AM, 16 units in afternoon, and 10 units dinner time Home glucose monitoring: Frequency: 3 times a day Timing: before meals Instruction/counseling given: reminded to bring blood glucose meter & log to each visit, reminded to bring medications to each visit and discussed diet Educational resources provided: handout Self management tools provided:   Other plans: meter requested on next visit and adjust insulin as needed. Metformin has been discontinued on prior hospital admissions which I think is reasonable at this point given she is on insulin therapy and will reduce her pill burden. Next A1C check in January. If a1c improves to 7's can consider revisiting metformin in the future

## 2014-08-14 NOTE — Assessment & Plan Note (Signed)
BP Readings from Last 3 Encounters:  08/14/14 159/86  08/08/14 147/74  07/21/14 143/69   Lab Results  Component Value Date   NA 142 08/08/2014   K 3.2* 08/08/2014   CREATININE 0.77 08/08/2014   Assessment: Blood pressure control: moderately elevated Progress toward BP goal:  improved Comments: did bring all her medications today, lisinopril and metoprolol bottles were empty  Plan: Medications:  continue current medications refilled metoprolol 50mg  and lisinopril 10mg , continue lasix 20mg , and norvasc 10mg  Educational resources provided: handout Self management tools provided:   Other plans: recheck 2 weeks, if still elevated, will increase ACEi and will also work on BB if needed

## 2014-08-14 NOTE — Patient Instructions (Signed)
General Instructions:  Thank you for bringing your medicines today. This helps [place] keep you safe from mistakes.  Please restart your lisinopril and metoprolol 1 tablet daily, we have refilled your medications today  Come back in 2 weeks for a recheck with me  We can try to get shingles vaccine if your insurance will cover it, take to your local pharmacy and let us know if you get it  Recheck xray 5 weeks  Follow up with cardiology as scheduled  Bring me your meter and weight chart next time please. Check your blood pressure at home and bring me a log. If staying her than 140/90, call and let me know.   Progress Toward Treatment Goals:  Treatment Goal 08/14/2014  Hemoglobin A1C unable to assess  Blood pressure improved  Stop smoking smoking less  Prevent falls -    Self Care Goals & Plans:  Self Care Goal 08/14/2014  Manage my medications take my medicines as prescribed; bring my medications to every visit; refill my medications on time  Monitor my health -  Eat healthy foods drink diet soda or water instead of juice or soda; eat foods that are low in salt; eat baked foods instead of fried foods  Be physically active -  Stop smoking call [facility] (1-800-QUIT-NOW); see a smoking cessation counselor    Home Blood Glucose Monitoring 08/14/2014  Check my blood sugar 3 times a day  When to check my blood sugar before meals     Care Management & Community Referrals:  Referral 01/30/2013  Referrals made for care management support diabetes educator; nutritionist  Referrals made to community resources smoking cessation; nutrition

## 2014-08-14 NOTE — Assessment & Plan Note (Addendum)
Back to baseline per patient. Completed levaquin and prednisone course from hospital discharge.   Follow up cxr in 5 weeks Smoking cessation strongly advised May need evaluation for nocturnal o2 that she says she occasionally uses, she has followed up with Dr. Gwenette Greet who may be prescribing? will have RN try to find out more specifics on order

## 2014-08-14 NOTE — Assessment & Plan Note (Signed)
  Assessment: Progress toward smoking cessation:  smoking less (2 cigarettes a day) Barriers to progress toward smoking cessation:    Comments: working on cessation, working with home assistance and group sessins  Plan: Instruction/counseling given:  I counseled patient on the dangers of tobacco use, advised patient to stop smoking, and reviewed strategies to maximize success. Educational resources provided:    Self management tools provided:    Medications to assist with smoking cessation:  group sessions Patient agreed to the following self-care plans for smoking cessation: call [facility] (1-800-QUIT-NOW), see a smoking cessation counselor  Other plans: encouraged her on cutting back, she is hopeful to completely quit and I think she can do it. We discussed possible patches but she wishes to continue group sessions for now and change to patches if it is not working

## 2014-08-14 NOTE — Progress Notes (Signed)
Subjective:   Patient ID: [name] female   DOB: [birth year] 63 y.o.   MRN: [id number]  HPI: [name] is a 63 y.o. female with extensive PMH as listed below presenting to opc today for hospital follow up visit.   HCAP/COPD exacerbation--recent hospital admission from 11/4-11/6. Discharged with levaquin and prednisone that she has completed. Feeling back to her baseline, no SOB at rest or chest pain. Still has chronic DOE. Using albuterol inhaler prn and no wheezing.  CXR: IMPRESSION: Mild patchy right upper lobe opacity, suspicious for pneumonia. Mild patchy bibasilar opacities, likely atelectasis.  DM2--did not bring in meter today but she says her blood sugars are much improved usually <150, sometimes lowest has been 90, no hypoglycemic episodes. cbg today 167. She is also losing weight and has people coming to her home to assist with her diabetes education and smoking cessation.   HTN--BP elevated today but has been out of lisinopril and metoprolol for a few days as only a limited quantity was prescribed on discharge. BP improved to 159/86 on repeat. Did not take any of her medications today. Has recently had hypotension and AKI on prior admissions and thus there have been several changes to her medications.  Past Medical History  Diagnosis Date  . Hyperthyroidism, subclinical   . Post menopausal syndrome   . Obesity   . Cardiac defibrillator in situ     Atlas II VR (SJM) implanted by Dr Lovena Le  . Eczema   . Lipoma   . Chronic ulcer of leg   . Hyperlipidemia   . Chronic combined systolic and diastolic heart failure     a. EF 35-40% in past;  b. Echo 7/13:  EF 45-50%, Gr 2 diast dysfn, mild AI, mild MAC, trivial MR, mild LAE, PASP 47.  [name] NICM (nonischemic cardiomyopathy)   . Diabetes mellitus   . HTN (hypertension)   . Elevated alkaline phosphatase level     GGT and 5'nucleotidase 8/13 normal  . Hx of cardiac cath     a. [place] 2003 normal;  b. LHC 6/13:  Mild  calcification in the LM, o/w normal coronary arteries, EF 45%.   . Sleep apnea     pt denies 04/12/2013  . COPD (chronic obstructive pulmonary disease)     O2 at night  . Automatic implantable cardioverter-defibrillator in situ   . CHF (congestive heart failure)   . Depression   . Implantable cardioverter-defibrillator (ICD) generator end of life   . ATN (acute tubular necrosis) 07/15/2014   Current Outpatient Prescriptions  Medication Sig Dispense Refill  . albuterol (VENTOLIN HFA) 108 (90 BASE) MCG/ACT inhaler Inhale 2 puffs into the lungs every 6 (six) hours as needed. Shortness of breath 1 Inhaler 6  . amLODipine (NORVASC) 10 MG tablet Take 1 tablet (10 mg total) by mouth daily. 30 tablet 3  . aspirin EC 81 MG tablet Take 81 mg by mouth daily.    [name] atorvastatin (LIPITOR) 80 MG tablet Take 1 tablet (80 mg total) by mouth at bedtime. 30 tablet 11  . citalopram (CELEXA) 20 MG tablet Take 1 tablet (20 mg total) by mouth daily. 30 tablet 11  . furosemide (LASIX) 20 MG tablet Take 1 tablet (20 mg total) by mouth daily. 30 tablet 3  . gabapentin (NEURONTIN) 300 MG capsule Take 1 capsule (300 mg total) by mouth at bedtime. 30 capsule 11  . glucose blood (ONE TOUCH ULTRA TEST) test strip Use as instructed by doctor up  to 3x daily,dx code 250.02 insulin requiring 100 each 5  . guaiFENesin-codeine 100-10 MG/5ML syrup Take 5 mLs by mouth every 6 (six) hours as needed for cough. 120 mL 0  . insulin glargine (LANTUS) 100 UNIT/ML injection Inject 44 Units into the skin daily with breakfast.    . insulin lispro (HUMALOG) 100 UNIT/ML injection Inject 8 units with breakfast and 16 units with lunch and 10 units with dinner 10 mL 5  . lisinopril (PRINIVIL,ZESTRIL) 10 MG tablet Take 1 tablet (10 mg total) by mouth daily. 30 tablet 3  . metoprolol succinate (TOPROL-XL) 50 MG 24 hr tablet Take 1 tablet (50 mg total) by mouth daily. Take with or immediately following a meal. 30 tablet 3  . pantoprazole  (PROTONIX) 40 MG tablet Take 1 tablet (40 mg total) by mouth daily before breakfast. 30 tablet 0  . nitroGLYCERIN (NITROSTAT) 0.4 MG SL tablet Place 0.4 mg under the tongue every 5 (five) minutes as needed for chest pain.    [name] ondansetron (ZOFRAN-ODT) 4 MG disintegrating tablet   0   No current facility-administered medications for this visit.   Family History  Problem Relation Age of Onset  . Stroke Mother   . Seizures Father   . Diabetes Sister   . Asthma Maternal Aunt     aunts  . Asthma Maternal Uncle     uncles  . Heart disease Father   . Heart disease Paternal Aunt     aunts  . Heart disease Paternal Uncle     uncles  . Heart disease Maternal Aunt     aunts  . Heart disease Maternal Uncle     uncles  . Heart disease Maternal Grandfather    History   Social History  . Marital Status: Widowed    Spouse Name: Alroy Dust    Number of Children: N/A  . Years of Education: 11   Occupational History  . disabled    Social History Main Topics  . Smoking status: Light Tobacco Smoker -- 0.25 packs/day for 35 years    Types: Cigarettes  . Smokeless tobacco: Not on file     Comment: currently smoking 3-4 cigs daily or less.  . Alcohol Use: 0.6 oz/week    1 Glasses of wine per week  . Drug Use: No  . Sexual Activity: Not on file   Other Topics Concern  . Not on file   Social History Narrative   ** Merged History Encounter **       Married   Review of Systems:  Constitutional:  Denies fever, chills  HEENT:  Denies congestion  Respiratory:  DOE  Cardiovascular:  Denies chest pain  Gastrointestinal:  Denies nausea, vomiting, abdominal pain  Musculoskeletal:  Denies gait problem.   Skin:  Denies pallor, rash and wound.   Neurological:  Denies headaches.    Objective:  Physical Exam: Filed Vitals:   08/14/14 1052 08/14/14 1608  BP: 162/82 159/86  Pulse: 92 90  Temp: 97.7 F (36.5 C)   TempSrc: Oral   Weight: 232 lb (105.235 kg)   SpO2: 94%    Vitals  reviewed. General: sitting in chair, NAD HEENT: EOMI Cardiac: RRR Pulm: b/l expiratory wheezing Abd: soft, obese, nontender, BS present Ext:  Moving all extremities Neuro: alert and oriented X3, strength and sensation to light touch equal in bilateral upper and lower extremities  Assessment & Plan:  Discussed with Dr. Daryll Drown

## 2014-08-15 NOTE — Progress Notes (Signed)
Internal Medicine Clinic Attending  Case discussed with Dr. Qureshi soon after the resident saw the patient.  We reviewed the resident's history and exam and pertinent patient test results.  I agree with the assessment, diagnosis, and plan of care documented in the resident's note. 

## 2014-08-19 ENCOUNTER — Other Ambulatory Visit: Payer: Self-pay | Admitting: Internal Medicine

## 2014-08-19 DIAGNOSIS — E785 Hyperlipidemia, unspecified: Secondary | ICD-10-CM

## 2014-08-19 DIAGNOSIS — E1169 Type 2 diabetes mellitus with other specified complication: Secondary | ICD-10-CM

## 2014-08-19 MED ORDER — ATORVASTATIN CALCIUM 80 MG PO TABS: 80.0000 mg | ORAL_TABLET | Freq: Every day | ORAL | Status: DC

## 2014-08-21 ENCOUNTER — Other Ambulatory Visit: Payer: Self-pay | Admitting: Internal Medicine

## 2014-08-22 NOTE — Telephone Encounter (Signed)
Is she actually taking this. Recently started in hospital but not sure if she needs it.

## 2014-08-25 NOTE — Telephone Encounter (Signed)
Unable to reach pt by phone.  She does have a scheduled appointment with you on 12/1.

## 2014-08-26 ENCOUNTER — Encounter: Payer: Self-pay | Admitting: Internal Medicine

## 2014-08-26 ENCOUNTER — Ambulatory Visit (INDEPENDENT_AMBULATORY_CARE_PROVIDER_SITE_OTHER): Payer: PRIVATE HEALTH INSURANCE | Admitting: Internal Medicine

## 2014-08-26 VITALS — BP 160/82 | HR 113 | Ht 60.0 in | Wt 234.4 lb

## 2014-08-26 DIAGNOSIS — Z720 Tobacco use: Secondary | ICD-10-CM

## 2014-08-26 DIAGNOSIS — I5042 Chronic combined systolic (congestive) and diastolic (congestive) heart failure: Secondary | ICD-10-CM

## 2014-08-26 DIAGNOSIS — I429 Cardiomyopathy, unspecified: Secondary | ICD-10-CM

## 2014-08-26 DIAGNOSIS — Z95810 Presence of automatic (implantable) cardiac defibrillator: Secondary | ICD-10-CM

## 2014-08-26 DIAGNOSIS — F17200 Nicotine dependence, unspecified, uncomplicated: Secondary | ICD-10-CM

## 2014-08-26 DIAGNOSIS — I10 Essential (primary) hypertension: Secondary | ICD-10-CM

## 2014-08-26 DIAGNOSIS — I428 Other cardiomyopathies: Secondary | ICD-10-CM

## 2014-08-26 LAB — MDC_IDC_ENUM_SESS_TYPE_INCLINIC
Battery Remaining Longevity: 96 mo
Brady Statistic RV Percent Paced: 0 %
Date Time Interrogation Session: 20151124164932
HighPow Impedance: 45.7322
HighPow Impedance: 46 Ohm
Implantable Pulse Generator Serial Number: 7206538
Lead Channel Impedance Value: 425 Ohm
Lead Channel Pacing Threshold Amplitude: 0.75 V
Lead Channel Pacing Threshold Pulse Width: 0.5 ms
Lead Channel Sensing Intrinsic Amplitude: 11.9 mV
Lead Channel Setting Pacing Amplitude: 2.5 V
Lead Channel Setting Pacing Pulse Width: 0.5 ms
Lead Channel Setting Sensing Sensitivity: 0.5 mV
Zone Setting Detection Interval: 250 ms
Zone Setting Detection Interval: 300 ms

## 2014-08-26 NOTE — Assessment & Plan Note (Signed)
Her chronic systolic heart failure is currently class IIB. I discussed the importance of maintaining compliance with her medications and diet. We also discussed weight loss. She states that she will try to do better. I've asked the patient to weigh herself daily.

## 2014-08-26 NOTE — Assessment & Plan Note (Signed)
She is still smoking approximately half pack a day. Hopefully she will be able to stop completely.

## 2014-08-26 NOTE — Assessment & Plan Note (Signed)
Her systolic blood pressure is elevated today but she has not yet taken her medications. She is encouraged to do so. She is encouraged to maintain a low-sodium diet and to lose weight. She is encouraged to increase her physical activity.

## 2014-08-26 NOTE — Assessment & Plan Note (Signed)
Her St. Jude single-chamber ICD is working normally. She has had no recurrent ventricular arrhythmias.

## 2014-08-26 NOTE — Progress Notes (Signed)
HPI [name] returns today for followup. She is a very pleasant 63 year old woman with a nonischemic cardiomyopathy, obesity, hypertension, diabetes, and COPD. She denies chest pain or syncope. She has been fairly sedentary. She has been hospitalized with worsening heart failure symptoms. She admits to dietary and medical noncompliance. No ICD shocks. Today, she has not yet taken her medications. Allergies  Allergen Reactions  . Avandia [Rosiglitazone Maleate]     unknown  . Hydrocortisone     unknown  . Pioglitazone     REACTION: congestive heart failure ACTOS     Current Outpatient Prescriptions  Medication Sig Dispense Refill  . albuterol (VENTOLIN HFA) 108 (90 BASE) MCG/ACT inhaler Inhale 2 puffs into the lungs every 6 (six) hours as needed. Shortness of breath 1 Inhaler 6  . amLODipine (NORVASC) 10 MG tablet Take 1 tablet (10 mg total) by mouth daily. 30 tablet 3  . aspirin EC 81 MG tablet Take 81 mg by mouth daily.    [name] atorvastatin (LIPITOR) 80 MG tablet Take 1 tablet (80 mg total) by mouth at bedtime. 90 tablet 5  . citalopram (CELEXA) 20 MG tablet Take 1 tablet (20 mg total) by mouth daily. 30 tablet 11  . furosemide (LASIX) 20 MG tablet Take 1 tablet (20 mg total) by mouth daily. 30 tablet 3  . gabapentin (NEURONTIN) 300 MG capsule Take 1 capsule (300 mg total) by mouth at bedtime. 30 capsule 11  . glucose blood (ONE TOUCH ULTRA TEST) test strip Use as instructed by doctor up to 3x daily,dx code 250.02 insulin requiring 100 each 5  . guaiFENesin-codeine 100-10 MG/5ML syrup Take 5 mLs by mouth every 6 (six) hours as needed for cough. 120 mL 0  . insulin glargine (LANTUS) 100 UNIT/ML injection Inject 44 Units into the skin daily with breakfast.    . insulin lispro (HUMALOG) 100 UNIT/ML injection Inject 8 units with breakfast and 16 units with lunch and 10 units with dinner 10 mL 5  . lisinopril (PRINIVIL,ZESTRIL) 10 MG tablet Take 1 tablet (10 mg total) by mouth daily. 30 tablet 3   . metoCLOPramide (REGLAN) 5 MG tablet Take 1 tablet by mouth daily.  0  . metoprolol succinate (TOPROL-XL) 50 MG 24 hr tablet Take 1 tablet (50 mg total) by mouth daily. Take with or immediately following a meal. 30 tablet 3  . nitroGLYCERIN (NITROSTAT) 0.4 MG SL tablet Place 0.4 mg under the tongue every 5 (five) minutes as needed for chest pain.    [name] ondansetron (ZOFRAN-ODT) 4 MG disintegrating tablet Take 4 mg by mouth every 8 (eight) hours as needed for nausea.   0  . pantoprazole (PROTONIX) 40 MG tablet Take 1 tablet (40 mg total) by mouth daily before breakfast. 30 tablet 0   No current facility-administered medications for this visit.     Past Medical History  Diagnosis Date  . Hyperthyroidism, subclinical   . Post menopausal syndrome   . Obesity   . Cardiac defibrillator in situ     Atlas II VR (SJM) implanted by Dr Lovena Le  . Eczema   . Lipoma   . Chronic ulcer of leg   . Hyperlipidemia   . Chronic combined systolic and diastolic heart failure     a. EF 35-40% in past;  b. Echo 7/13:  EF 45-50%, Gr 2 diast dysfn, mild AI, mild MAC, trivial MR, mild LAE, PASP 47.  [name] NICM (nonischemic cardiomyopathy)   . Diabetes mellitus   . HTN (hypertension)   .  Elevated alkaline phosphatase level     GGT and 5'nucleotidase 8/13 normal  . Hx of cardiac cath     a. [place] 2003 normal;  b. LHC 6/13:  Mild calcification in the LM, o/w normal coronary arteries, EF 45%.   . Sleep apnea     pt denies 04/12/2013  . COPD (chronic obstructive pulmonary disease)     O2 at night  . Automatic implantable cardioverter-defibrillator in situ   . CHF (congestive heart failure)   . Depression   . Implantable cardioverter-defibrillator (ICD) generator end of life   . ATN (acute tubular necrosis) 07/15/2014    ROS:   All systems reviewed and negative except as noted in the HPI.   Past Surgical History  Procedure Laterality Date  . Cardiac defibrillator placement  05/04/2007    SJM Atlas II VR ICD   . Hysteroscopy    . Cardiac defibrillator placement    . Abdominal hysterectomy    . Cardiac catheterization    . Insert / replace / remove pacemaker    . Tubal ligation    . Hernia repair    . Colonoscopy N/A 04/12/2013    Procedure: COLONOSCOPY;  Surgeon: Beryle Beams, MD;  Location: [facility];  Service: Endoscopy;  Laterality: N/A;  pt.has defibrilator     Family History  Problem Relation Age of Onset  . Stroke Mother   . Seizures Father   . Diabetes Sister   . Asthma Maternal Aunt     aunts  . Asthma Maternal Uncle     uncles  . Heart disease Father   . Heart disease Paternal Aunt     aunts  . Heart disease Paternal Uncle     uncles  . Heart disease Maternal Aunt     aunts  . Heart disease Maternal Uncle     uncles  . Heart disease Maternal Grandfather      History   Social History  . Marital Status: Widowed    Spouse Name: Alroy Dust    Number of Children: N/A  . Years of Education: 11   Occupational History  . disabled    Social History Main Topics  . Smoking status: Light Tobacco Smoker -- 0.25 packs/day for 35 years    Types: Cigarettes  . Smokeless tobacco: Not on file     Comment: currently smoking 3-4 cigs daily or less.  . Alcohol Use: 0.6 oz/week    1 Glasses of wine per week  . Drug Use: No  . Sexual Activity: Not on file   Other Topics Concern  . Not on file   Social History Narrative   ** Merged History Encounter **       Married     BP 160/82 mmHg  Pulse 113  Ht 5' (1.524 m)  Wt 234 lb 6.4 oz (106.323 kg)  BMI 45.78 kg/m2  Physical Exam:  Well appearing obese,middle-aged woman, NAD HEENT: Unremarkable Neck:  7 cm JVD, no thyromegally Lungs:  Clear with no wheezes, rales, or rhonchi. HEART:  Regular rate rhythm, no murmurs, no rubs, no clicks Abd:  soft, positive bowel sounds, no organomegally, no rebound, no guarding Ext:  2 plus pulses, trace peripheral edema, no cyanosis, no clubbing Skin:  No rashes no  nodules Neuro:  CN II through XII intact, motor grossly intact  DEVICE  Normal device function.  See PaceArt for details.   Assess/Plan:

## 2014-08-26 NOTE — Patient Instructions (Signed)
Your physician wants you to follow-up in: 12 months with Dr. Knox Saliva will receive a reminder letter in the mail two months in advance. If you don't receive a letter, please call our office to schedule the follow-up appointment.   Remote monitoring is used to monitor your Pacemaker or ICD from home. This monitoring reduces the number of office visits required to check your device to one time per year. It allows [place] to keep an eye on the functioning of your device to ensure it is working properly. You are scheduled for a device check from home on 11/26/14. You may send your transmission at any time that day. If you have a wireless device, the transmission will be sent automatically. After your physician reviews your transmission, you will receive a postcard with your next transmission date.

## 2014-09-02 ENCOUNTER — Ambulatory Visit (INDEPENDENT_AMBULATORY_CARE_PROVIDER_SITE_OTHER): Payer: PRIVATE HEALTH INSURANCE | Admitting: Internal Medicine

## 2014-09-02 ENCOUNTER — Encounter: Payer: Self-pay | Admitting: Internal Medicine

## 2014-09-02 VITALS — BP 140/61 | HR 83 | Temp 97.5°F | Ht 60.0 in | Wt 233.0 lb

## 2014-09-02 DIAGNOSIS — Z0000 Encounter for general adult medical examination without abnormal findings: Secondary | ICD-10-CM

## 2014-09-02 DIAGNOSIS — Z720 Tobacco use: Secondary | ICD-10-CM

## 2014-09-02 DIAGNOSIS — E1143 Type 2 diabetes mellitus with diabetic autonomic (poly)neuropathy: Secondary | ICD-10-CM

## 2014-09-02 DIAGNOSIS — IMO0002 Reserved for concepts with insufficient information to code with codable children: Secondary | ICD-10-CM

## 2014-09-02 DIAGNOSIS — I10 Essential (primary) hypertension: Secondary | ICD-10-CM

## 2014-09-02 DIAGNOSIS — F17200 Nicotine dependence, unspecified, uncomplicated: Secondary | ICD-10-CM

## 2014-09-02 DIAGNOSIS — E1165 Type 2 diabetes mellitus with hyperglycemia: Secondary | ICD-10-CM

## 2014-09-02 DIAGNOSIS — K3184 Gastroparesis: Secondary | ICD-10-CM

## 2014-09-02 LAB — GLUCOSE, CAPILLARY: Glucose-Capillary: 102 mg/dL — ABNORMAL HIGH (ref 70–99)

## 2014-09-02 MED ORDER — INSULIN LISPRO 100 UNIT/ML ~~LOC~~ SOLN: SUBCUTANEOUS | Status: DC

## 2014-09-02 NOTE — Patient Instructions (Addendum)
General Instructions:  Thank you for bringing your medicines today. This helps [place] keep you safe from mistakes.  Keep up the great work with your blood pressure and blood sugars. Lets work a little harder on the blood sugars and diabetes. Decrease weight, stop the regular sodas and walk more. Change your humalog insulin dose to 10 units am, 16 units lunch, and 10 units PM and continue lantus 44 units daily  Please STOP smoking  Progress Toward Treatment Goals:  Treatment Goal 09/02/2014  Hemoglobin A1C (No Data)  Blood pressure improved  Stop smoking smoking the same amount  Prevent falls -    Self Care Goals & Plans:  Self Care Goal 09/02/2014  Manage my medications take my medicines as prescribed; bring my medications to every visit; refill my medications on time  Monitor my health keep track of my blood glucose; bring my glucose meter and log to each visit; check my feet daily  Eat healthy foods eat more vegetables; eat foods that are low in salt; eat baked foods instead of fried foods  Be physically active find an activity I enjoy; take a walk every day  Stop smoking call [facility] (1-800-QUIT-NOW)    Home Blood Glucose Monitoring 09/02/2014  Check my blood sugar 3 times a day  When to check my blood sugar before meals     Care Management & Community Referrals:  Referral 01/30/2013  Referrals made for care management support diabetes educator; nutritionist  Referrals made to community resources smoking cessation; nutrition

## 2014-09-03 NOTE — Assessment & Plan Note (Signed)
zostavax prescription given today

## 2014-09-03 NOTE — Progress Notes (Signed)
Subjective:   Patient ID: [name] female   DOB: Jul 26, 1951 63 y.o.   MRN: [id number]  HPI: [name] is a 63 y.o. female with extensive PMH as listed below presenting to opc today for BP follow up visit.   HTN--BP improved but still slightly elevated at 140/61. She brought all her medicine in today and has been taking it without any complaints. She wishes to continue on current doses and believes her BP will continue to improve.   DM2--uncontrolled, but CBGs improving slowly. She has not been checking her sugars at night because she says she does not eat at that time but I have encouraged her to do so.   She needs repeat PFTs as her prior study did not show obstruction per ratio but did not provide lung volumes.  Past Medical History  Diagnosis Date  . Hyperthyroidism, subclinical   . Post menopausal syndrome   . Obesity   . Cardiac defibrillator in situ     Atlas II VR (SJM) implanted by Dr Lovena Le  . Eczema   . Lipoma   . Chronic ulcer of leg   . Hyperlipidemia   . Chronic combined systolic and diastolic heart failure     a. EF 35-40% in past;  b. Echo 7/13:  EF 45-50%, Gr 2 diast dysfn, mild AI, mild MAC, trivial MR, mild LAE, PASP 47.  [name] NICM (nonischemic cardiomyopathy)   . Diabetes mellitus   . HTN (hypertension)   . Elevated alkaline phosphatase level     GGT and 5'nucleotidase 8/13 normal  . Hx of cardiac cath     a. [place] 2003 normal;  b. LHC 6/13:  Mild calcification in the LM, o/w normal coronary arteries, EF 45%.   . Sleep apnea     pt denies 04/12/2013  . COPD (chronic obstructive pulmonary disease)     O2 at night  . Automatic implantable cardioverter-defibrillator in situ   . CHF (congestive heart failure)   . Depression   . Implantable cardioverter-defibrillator (ICD) generator end of life   . ATN (acute tubular necrosis) 07/15/2014   Current Outpatient Prescriptions  Medication Sig Dispense Refill  . albuterol (VENTOLIN HFA) 108 (90 BASE) MCG/ACT  inhaler Inhale 2 puffs into the lungs every 6 (six) hours as needed. Shortness of breath 1 Inhaler 6  . amLODipine (NORVASC) 10 MG tablet Take 1 tablet (10 mg total) by mouth daily. 30 tablet 3  . aspirin EC 81 MG tablet Take 81 mg by mouth daily.    [name] atorvastatin (LIPITOR) 80 MG tablet Take 1 tablet (80 mg total) by mouth at bedtime. 90 tablet 5  . citalopram (CELEXA) 20 MG tablet Take 1 tablet (20 mg total) by mouth daily. 30 tablet 11  . furosemide (LASIX) 20 MG tablet Take 1 tablet (20 mg total) by mouth daily. 30 tablet 3  . gabapentin (NEURONTIN) 300 MG capsule Take 1 capsule (300 mg total) by mouth at bedtime. 30 capsule 11  . glucose blood (ONE TOUCH ULTRA TEST) test strip Use as instructed by doctor up to 3x daily,dx code 250.02 insulin requiring 100 each 5  . insulin glargine (LANTUS) 100 UNIT/ML injection Inject 44 Units into the skin daily with breakfast.    . insulin lispro (HUMALOG) 100 UNIT/ML injection Inject 10 units with breakfast and 16 units with lunch and 10 units with dinner 10 mL 5  . lisinopril (PRINIVIL,ZESTRIL) 10 MG tablet Take 1 tablet (10 mg total) by mouth daily.  30 tablet 3  . metoCLOPramide (REGLAN) 5 MG tablet Take 1 tablet by mouth daily.  0  . metoprolol succinate (TOPROL-XL) 50 MG 24 hr tablet Take 1 tablet (50 mg total) by mouth daily. Take with or immediately following a meal. 30 tablet 3  . ondansetron (ZOFRAN-ODT) 4 MG disintegrating tablet Take 4 mg by mouth every 8 (eight) hours as needed for nausea.   0  . pantoprazole (PROTONIX) 40 MG tablet Take 1 tablet (40 mg total) by mouth daily before breakfast. 30 tablet 0  . guaiFENesin-codeine 100-10 MG/5ML syrup Take 5 mLs by mouth every 6 (six) hours as needed for cough. (Patient not taking: Reported on 09/02/2014) 120 mL 0  . nitroGLYCERIN (NITROSTAT) 0.4 MG SL tablet Place 0.4 mg under the tongue every 5 (five) minutes as needed for chest pain.     No current facility-administered medications for this visit.     Family History  Problem Relation Age of Onset  . Stroke Mother   . Seizures Father   . Diabetes Sister   . Asthma Maternal Aunt     aunts  . Asthma Maternal Uncle     uncles  . Heart disease Father   . Heart disease Paternal Aunt     aunts  . Heart disease Paternal Uncle     uncles  . Heart disease Maternal Aunt     aunts  . Heart disease Maternal Uncle     uncles  . Heart disease Maternal Grandfather    History   Social History  . Marital Status: Widowed    Spouse Name: Alroy Dust    Number of Children: N/A  . Years of Education: 11   Occupational History  . disabled    Social History Main Topics  . Smoking status: Light Tobacco Smoker -- 0.25 packs/day for 35 years    Types: Cigarettes  . Smokeless tobacco: None     Comment: currently smoking 3-4 cigs daily or less.  . Alcohol Use: No  . Drug Use: No  . Sexual Activity: None   Other Topics Concern  . None   Social History Narrative   ** Merged History Encounter **       Married   Review of Systems:  Constitutional:  Denies fever, chills  HEENT:  Denies congestion  Respiratory:  DOE  Cardiovascular:  Denies chest pain or leg swelling  Gastrointestinal:  Denies nausea, vomiting, abdominal pain  Genitourinary:  Denies dysuria  Musculoskeletal:  Denies gait problem.   Skin:  Denies pallor, rash and wound.   Neurological:  Denies headaches.    Objective:  Physical Exam: Filed Vitals:   09/02/14 1433  BP: 140/61  Pulse: 83  Temp: 97.5 F (36.4 C)  TempSrc: Oral  Height: 5' (1.524 m)  Weight: 233 lb (105.688 kg)  SpO2: 96%   Vitals reviewed. General: sitting in chair, NAD HEENT: EOMI Cardiac: RRR Pulm: clear to auscultation bilaterally Abd: soft, obese, BS present Ext: warm and well perfused, no pedal edema Neuro: alert and oriented X3, strength and sensation to light touch equal in bilateral upper and lower extremities  Assessment & Plan:  Discussed with Dr. Marinda Elk Increase humalog to  10 AM, keep 16 lunch and 10 PM

## 2014-09-03 NOTE — Telephone Encounter (Signed)
Can you close this request out.  Pt seen yesterday in clinic

## 2014-09-03 NOTE — Addendum Note (Signed)
Addended by: Wilber Oliphant on: 09/03/2014 01:51 PM   Modules accepted: Level of Service

## 2014-09-03 NOTE — Assessment & Plan Note (Signed)
  Assessment: Progress toward smoking cessation:  smoking the same amount (2 cigarettes per day) Barriers to progress toward smoking cessation:    Comments: slowly quitting  Plan: Instruction/counseling given:  I counseled patient on the dangers of tobacco use, advised patient to stop smoking, and reviewed strategies to maximize success. Educational resources provided:    Self management tools provided:    Medications to assist with smoking cessation:  None Patient agreed to the following self-care plans for smoking cessation: call [facility] (1-800-QUIT-NOW)  Other plans: continuing to encourage her to stop all together, she is close! And working on it

## 2014-09-03 NOTE — Assessment & Plan Note (Signed)
BP Readings from Last 3 Encounters:  09/02/14 140/61  08/26/14 160/82  08/14/14 159/86    Lab Results  Component Value Date   NA 141 08/14/2014   K 4.0 08/14/2014   CREATININE 0.95 08/14/2014   Assessment: Blood pressure control: mildly elevated Progress toward BP goal:  improved Comments: compliant with medications  Plan: Medications:  continue current medications lisinopril 10 mg, metoprolol xr 50mg , norvasc 10mg , lasix 20mg  Educational resources provided:   Self management tools provided:   Other plans: continue to monitor, if starting to get elevated more, will increase ACEi

## 2014-09-03 NOTE — Assessment & Plan Note (Addendum)
No more nausea or vomiting since on reglan but wants to try to come off it once current prescription is done which we will do based on her preference and observe. She will let me know if her symptoms return and we will restart. I suspect with improving blood sugars her symptoms are also better controlled.  Stopped protonix today as she denies any heartburn and does not want to continue.

## 2014-09-03 NOTE — Assessment & Plan Note (Signed)
Lab Results  Component Value Date   HGBA1C 12.7* 07/12/2014   HGBA1C 10.9 04/08/2014   HGBA1C 10.0 10/08/2013    Assessment: Diabetes control: poor control (HgbA1C >9%) Progress toward A1C goal:   (cbgs improving) Comments: CBGs slowly improving but mostly elevated in AM with values above 200 some days but no longer in the 300s very often. She is working on diet and is hopeful they will continue to improve.   Plan: Medications:  continue lantus 44 units for now and increase humalog to 10 units in AM, continue 16 units with lunch and 10 units in PM.  Home glucose monitoring: Frequency: 3 times a day Timing: before meals Instruction/counseling given: reminded to bring blood glucose meter & log to each visit, reminded to bring medications to each visit and discussed diet Educational resources provided:   Self management tools provided: copy of home glucose meter download Other plans: I have asked her to start checking sugars at night as well and will have CDE check in with her in a couple of weeks to see how the values are as we may have to increase lantus as well and continue to adjust humalog dosing. Recheck in feb when she can come back as she does not wish to see anyone else and I am not in [facility] in January.

## 2014-09-04 NOTE — Progress Notes (Signed)
Internal Medicine Clinic Attending  Date of Visit: 09/02/2014  Case discussed with Dr. Eula Fried soon after the resident saw the patient.  We reviewed the resident's history and exam and pertinent patient test results.  I agree with the assessment, diagnosis, and plan of care documented in the resident's note.

## 2014-09-05 ENCOUNTER — Encounter: Payer: Self-pay | Admitting: Internal Medicine

## 2014-09-11 ENCOUNTER — Encounter (HOSPITAL_COMMUNITY): Payer: Self-pay | Admitting: Cardiology

## 2014-09-16 ENCOUNTER — Telehealth: Payer: Self-pay | Admitting: Dietician

## 2014-09-16 ENCOUNTER — Telehealth: Payer: Self-pay | Admitting: Internal Medicine

## 2014-09-16 MED ORDER — INSULIN GLARGINE 100 UNIT/ML ~~LOC~~ SOLN: 44.0000 [IU] | Freq: Every day | SUBCUTANEOUS | Status: DC

## 2014-09-16 NOTE — Addendum Note (Signed)
Addended by: Wilber Oliphant on: 09/16/2014 05:59 PM   Modules accepted: Orders

## 2014-09-16 NOTE — Telephone Encounter (Signed)
lantus refilled. Please let her know  Thank you

## 2014-09-16 NOTE — Telephone Encounter (Signed)
Asked to call patient about her blood sugars; she said "They are fine. 120-180" . 106 has been her lowest,  235 yesterday. Has a cold this week and has noticed they have been a bit higher. She does not have transportation right now, but agreed to try to find someone to bring her here for CDE to download her meter to determine if any insulin adjustment is needed before her  return in 3 months. Patient says she is using vials for her lantus and pens for her humalog: she gets one vial a month for her lantus- she needs 2 to take 44 units a day.  Will request new prescription for 2 vials

## 2014-09-16 NOTE — Telephone Encounter (Signed)
I called and spoke with [name] this afternoon in regards to a pharmacy request I received in my mail box for topical pain cream. She denies making the request for such products and says she told them she did not want it. As such, I have written on the form of patient's refusal of request.

## 2014-09-17 NOTE — Telephone Encounter (Signed)
Patient notified

## 2014-10-06 ENCOUNTER — Other Ambulatory Visit: Payer: Self-pay | Admitting: *Deleted

## 2014-10-06 DIAGNOSIS — F329 Major depressive disorder, single episode, unspecified: Secondary | ICD-10-CM

## 2014-10-06 DIAGNOSIS — F32A Depression, unspecified: Secondary | ICD-10-CM

## 2014-10-06 NOTE — Telephone Encounter (Signed)
Received faxed refill request from pt's pharmacy for there citalopram 20mg  tabs once daily #30.  Per faxed request, last refill was on 12/10/2013.  Pt contacted to verify if she's taking or not.  Pt states she takes it at times, but feels as though she does not need this medication.  Will send refill request to pcp for review.  Please advise.Despina Hidden Cassady1/4/20161:59 PM

## 2014-10-07 NOTE — Telephone Encounter (Signed)
I called and spoke with [name] this evening. She has not taken her celexa for at least one week and also takes it very infrequently when she does. She says she is not depressed and does not wish to continue and does not want refills.   We will not refill the medicine and ask RN to cancel from pharmacy.

## 2014-10-08 NOTE — Telephone Encounter (Signed)
Pharmacy was contacted and instructed to remove medication from [place], [place] Cassady1/6/20161:43 PM

## 2014-10-15 ENCOUNTER — Emergency Department (HOSPITAL_COMMUNITY): Payer: Medicare Other

## 2014-10-15 ENCOUNTER — Encounter (HOSPITAL_COMMUNITY): Payer: Self-pay | Admitting: *Deleted

## 2014-10-15 ENCOUNTER — Emergency Department (HOSPITAL_COMMUNITY)
Admission: EM | Admit: 2014-10-15 | Discharge: 2014-10-15 | Disposition: A | Discharge status: Home / Self Care | Payer: Medicare Other | Attending: Emergency Medicine | Admitting: Emergency Medicine

## 2014-10-15 DIAGNOSIS — J449 Chronic obstructive pulmonary disease, unspecified: Secondary | ICD-10-CM | POA: Insufficient documentation

## 2014-10-15 DIAGNOSIS — Z7982 Long term (current) use of aspirin: Secondary | ICD-10-CM | POA: Insufficient documentation

## 2014-10-15 DIAGNOSIS — Z95810 Presence of automatic (implantable) cardiac defibrillator: Secondary | ICD-10-CM | POA: Insufficient documentation

## 2014-10-15 DIAGNOSIS — J159 Unspecified bacterial pneumonia: Secondary | ICD-10-CM | POA: Diagnosis not present

## 2014-10-15 DIAGNOSIS — E785 Hyperlipidemia, unspecified: Secondary | ICD-10-CM | POA: Diagnosis not present

## 2014-10-15 DIAGNOSIS — Z720 Tobacco use: Secondary | ICD-10-CM | POA: Diagnosis not present

## 2014-10-15 DIAGNOSIS — Z87448 Personal history of other diseases of urinary system: Secondary | ICD-10-CM | POA: Diagnosis not present

## 2014-10-15 DIAGNOSIS — Z872 Personal history of diseases of the skin and subcutaneous tissue: Secondary | ICD-10-CM | POA: Diagnosis not present

## 2014-10-15 DIAGNOSIS — R112 Nausea with vomiting, unspecified: Secondary | ICD-10-CM | POA: Diagnosis not present

## 2014-10-15 DIAGNOSIS — R739 Hyperglycemia, unspecified: Secondary | ICD-10-CM

## 2014-10-15 DIAGNOSIS — Z794 Long term (current) use of insulin: Secondary | ICD-10-CM | POA: Diagnosis not present

## 2014-10-15 DIAGNOSIS — R0602 Shortness of breath: Secondary | ICD-10-CM

## 2014-10-15 DIAGNOSIS — R1110 Vomiting, unspecified: Secondary | ICD-10-CM | POA: Diagnosis present

## 2014-10-15 DIAGNOSIS — I10 Essential (primary) hypertension: Secondary | ICD-10-CM | POA: Diagnosis not present

## 2014-10-15 DIAGNOSIS — I509 Heart failure, unspecified: Secondary | ICD-10-CM | POA: Insufficient documentation

## 2014-10-15 DIAGNOSIS — J189 Pneumonia, unspecified organism: Secondary | ICD-10-CM

## 2014-10-15 DIAGNOSIS — E1165 Type 2 diabetes mellitus with hyperglycemia: Secondary | ICD-10-CM | POA: Diagnosis not present

## 2014-10-15 DIAGNOSIS — F329 Major depressive disorder, single episode, unspecified: Secondary | ICD-10-CM | POA: Insufficient documentation

## 2014-10-15 DIAGNOSIS — R000 Tachycardia, unspecified: Secondary | ICD-10-CM | POA: Diagnosis not present

## 2014-10-15 DIAGNOSIS — R109 Unspecified abdominal pain: Secondary | ICD-10-CM

## 2014-10-15 DIAGNOSIS — Z79899 Other long term (current) drug therapy: Secondary | ICD-10-CM | POA: Insufficient documentation

## 2014-10-15 LAB — COMPREHENSIVE METABOLIC PANEL
ALT: 13 U/L (ref 0–35)
AST: 18 U/L (ref 0–37)
Albumin: 4.1 g/dL (ref 3.5–5.2)
Alkaline Phosphatase: 89 U/L (ref 39–117)
Anion gap: 14 (ref 5–15)
BUN: 11 mg/dL (ref 6–23)
CO2: 25 mmol/L (ref 19–32)
Calcium: 9.2 mg/dL (ref 8.4–10.5)
Chloride: 97 mEq/L (ref 96–112)
Creatinine, Ser: 1.06 mg/dL (ref 0.50–1.10)
GFR calc Af Amer: 64 mL/min — ABNORMAL LOW (ref >90)
GFR calc non Af Amer: 55 mL/min — ABNORMAL LOW (ref >90)
Glucose, Bld: 249 mg/dL — ABNORMAL HIGH (ref 70–99)
Potassium: 3.8 mmol/L (ref 3.5–5.1)
Sodium: 136 mmol/L (ref 135–145)
Total Bilirubin: 1 mg/dL (ref 0.3–1.2)
Total Protein: 7.3 g/dL (ref 6.0–8.3)

## 2014-10-15 LAB — CBC WITH DIFFERENTIAL/PLATELET
Basophils Absolute: 0 10*3/uL (ref 0.0–0.1)
Basophils Relative: 0 % (ref 0–1)
Eosinophils Absolute: 0.1 10*3/uL (ref 0.0–0.7)
Eosinophils Relative: 1 % (ref 0–5)
HCT: 44.9 % (ref 36.0–46.0)
Hemoglobin: 14.9 g/dL (ref 12.0–15.0)
Lymphocytes Relative: 23 % (ref 12–46)
Lymphs Abs: 2.3 10*3/uL (ref 0.7–4.0)
MCH: 27.1 pg (ref 26.0–34.0)
MCHC: 33.2 g/dL (ref 30.0–36.0)
MCV: 81.8 fL (ref 78.0–100.0)
Monocytes Absolute: 0.6 10*3/uL (ref 0.1–1.0)
Monocytes Relative: 6 % (ref 3–12)
Neutro Abs: 7.4 10*3/uL (ref 1.7–7.7)
Neutrophils Relative %: 70 % (ref 43–77)
Platelets: 229 10*3/uL (ref 150–400)
RBC: 5.49 MIL/uL — ABNORMAL HIGH (ref 3.87–5.11)
RDW: 15.3 % (ref 11.5–15.5)
WBC: 10.4 10*3/uL (ref 4.0–10.5)

## 2014-10-15 LAB — URINALYSIS, ROUTINE W REFLEX MICROSCOPIC
Glucose, UA: 100 mg/dL — AB
Hgb urine dipstick: NEGATIVE
Ketones, ur: 15 mg/dL — AB
Nitrite: POSITIVE — AB
Protein, ur: 100 mg/dL — AB
Specific Gravity, Urine: 1.034 — ABNORMAL HIGH (ref 1.005–1.030)
Urobilinogen, UA: 1 mg/dL (ref 0.0–1.0)
pH: 5 (ref 5.0–8.0)

## 2014-10-15 LAB — URINE MICROSCOPIC-ADD ON

## 2014-10-15 LAB — LIPASE, BLOOD: Lipase: 51 U/L (ref 11–59)

## 2014-10-15 LAB — I-STAT TROPONIN, ED: Troponin i, poc: 0.03 ng/mL (ref 0.00–0.08)

## 2014-10-15 LAB — OCCULT BLOOD X 1 CARD TO LAB, STOOL: Fecal Occult Bld: NEGATIVE

## 2014-10-15 LAB — BRAIN NATRIURETIC PEPTIDE: B Natriuretic Peptide: 32.9 pg/mL (ref 0.0–100.0)

## 2014-10-15 MED ORDER — ONDANSETRON 4 MG PO TBDP
8.0000 mg | ORAL_TABLET | Freq: Once | ORAL | Status: AC
  Administered 2014-10-15: 8 mg via ORAL
  Filled 2014-10-15: qty 2

## 2014-10-15 MED ORDER — DEXTROSE 5 % IV SOLN
1.0000 g | Freq: Once | INTRAVENOUS | Status: AC
  Administered 2014-10-15: 1 g via INTRAVENOUS
  Filled 2014-10-15: qty 10

## 2014-10-15 MED ORDER — CEPHALEXIN 250 MG PO CAPS
500.0000 mg | ORAL_CAPSULE | Freq: Once | ORAL | Status: AC
  Administered 2014-10-15: 500 mg via ORAL
  Filled 2014-10-15: qty 2

## 2014-10-15 MED ORDER — AZITHROMYCIN 250 MG PO TABS
500.0000 mg | ORAL_TABLET | Freq: Once | ORAL | Status: AC
  Administered 2014-10-15: 500 mg via ORAL
  Filled 2014-10-15: qty 2

## 2014-10-15 MED ORDER — ONDANSETRON HCL 4 MG/2ML IJ SOLN
4.0000 mg | Freq: Once | INTRAMUSCULAR | Status: AC
  Administered 2014-10-15: 4 mg via INTRAVENOUS
  Filled 2014-10-15: qty 2

## 2014-10-15 MED ORDER — AZITHROMYCIN 250 MG PO TABS: 250.0000 mg | ORAL_TABLET | Freq: Every day | ORAL | Status: DC

## 2014-10-15 MED ORDER — MORPHINE SULFATE 2 MG/ML IJ SOLN
2.0000 mg | Freq: Once | INTRAMUSCULAR | Status: AC
  Administered 2014-10-15: 2 mg via INTRAVENOUS
  Filled 2014-10-15: qty 1

## 2014-10-15 MED ORDER — SODIUM CHLORIDE 0.9 % IV BOLUS (SEPSIS)
500.0000 mL | Freq: Once | INTRAVENOUS | Status: AC
  Administered 2014-10-15: 500 mL via INTRAVENOUS

## 2014-10-15 MED ORDER — IOHEXOL 300 MG/ML  SOLN
25.0000 mL | Freq: Once | INTRAMUSCULAR | Status: AC | PRN
  Administered 2014-10-15: 25 mL via ORAL

## 2014-10-15 MED ORDER — IOHEXOL 300 MG/ML  SOLN
100.0000 mL | Freq: Once | INTRAMUSCULAR | Status: AC | PRN
  Administered 2014-10-15: 100 mL via INTRAVENOUS

## 2014-10-15 MED ORDER — ONDANSETRON 8 MG PO TBDP: 8.0000 mg | ORAL_TABLET | Freq: Three times a day (TID) | ORAL | Status: DC | PRN

## 2014-10-15 NOTE — ED Notes (Signed)
Opal Sidles, NT in room with this RN to assist in rectal temp and to obtain occult sample

## 2014-10-15 NOTE — Discharge Instructions (Signed)
Some lung changes were seen on your abdominal ct but not on your cxr.  You are being treated with an antibiotic.  Please return if you are worse at ant time.  Recheck with your doctor in the next 1-2 days.   Abdominal Pain Many things can cause abdominal pain. Usually, abdominal pain is not caused by a disease and will improve without treatment. It can often be observed and treated at home. Your health care provider will do a physical exam and possibly order blood tests and X-rays to help determine the seriousness of your pain. However, in many cases, more time must pass before a clear cause of the pain can be found. Before that point, your health care provider may not know if you need more testing or further treatment. HOME CARE INSTRUCTIONS  Monitor your abdominal pain for any changes. The following actions may help to alleviate any discomfort you are experiencing:  Only take over-the-counter or prescription medicines as directed by your health care provider.  Do not take laxatives unless directed to do so by your health care provider.  Try a clear liquid diet (broth, tea, or water) as directed by your health care provider. Slowly move to a bland diet as tolerated. SEEK MEDICAL CARE IF:  You have unexplained abdominal pain.  You have abdominal pain associated with nausea or diarrhea.  You have pain when you urinate or have a bowel movement.  You experience abdominal pain that wakes you in the night.  You have abdominal pain that is worsened or improved by eating food.  You have abdominal pain that is worsened with eating fatty foods.  You have a fever. SEEK IMMEDIATE MEDICAL CARE IF:   Your pain does not go away within 2 hours.  You keep throwing up (vomiting).  Your pain is felt only in portions of the abdomen, such as the right side or the left lower portion of the abdomen.  You pass bloody or black tarry stools. MAKE SURE YOU:  Understand these instructions.   Will watch  your condition.   Will get help right away if you are not doing well or get worse.  Document Released: 06/29/2005 Document Revised: 09/24/2013 Document Reviewed: 05/29/2013 [facility] Patient Information 2015 [place], [place]. This information is not intended to replace advice given to you by your health care provider. Make sure you discuss any questions you have with your health care provider.

## 2014-10-15 NOTE — ED Provider Notes (Signed)
CSN: [id number]     Arrival date & time 10/15/14  1446 History   First MD Initiated Contact with Patient 10/15/14 1605     Chief Complaint  Patient presents with  . Abdominal Pain  . Emesis     (Consider location/radiation/quality/duration/timing/severity/associated sxs/prior Treatment) HPI   64 year old female who comes in today complaining of some left-sided abdominal pain intermittently since Monday with associated nausea and vomiting. She states that she has tried to eat several times yesterday and today but has vomited afterwards each time. She describes food and in her emesis but no blood or fecal material. She has had some subjective fever but no chills. She states this is similar to when she was here with congestive heart failure in the past. She describes some shortness of breath and anterior chest discomfort. Left-sided abdominal pain is sharp crampy and intermittent. The chest pain is also sharp and crampy. She has some increased edema in her legs. She states that she has had decreased urine output. States her blood sugar was at 127 this morning.  Past Medical History  Diagnosis Date  . Hyperthyroidism, subclinical   . Post menopausal syndrome   . Obesity   . Cardiac defibrillator in situ     Atlas II VR (SJM) implanted by Dr Lovena Le  . Eczema   . Lipoma   . Chronic ulcer of leg   . Hyperlipidemia   . Chronic combined systolic and diastolic heart failure     a. EF 35-40% in past;  b. Echo 7/13:  EF 45-50%, Gr 2 diast dysfn, mild AI, mild MAC, trivial MR, mild LAE, PASP 47.  [name] NICM (nonischemic cardiomyopathy)   . Diabetes mellitus   . HTN (hypertension)   . Elevated alkaline phosphatase level     GGT and 5'nucleotidase 8/13 normal  . Hx of cardiac cath     a. [place] 2003 normal;  b. LHC 6/13:  Mild calcification in the LM, o/w normal coronary arteries, EF 45%.   . Sleep apnea     pt denies 04/12/2013  . COPD (chronic obstructive pulmonary disease)     O2 at night  .  Automatic implantable cardioverter-defibrillator in situ   . CHF (congestive heart failure)   . Depression   . Implantable cardioverter-defibrillator (ICD) generator end of life   . ATN (acute tubular necrosis) 07/15/2014   Past Surgical History  Procedure Laterality Date  . Cardiac defibrillator placement  05/04/2007    SJM Atlas II VR ICD  . Hysteroscopy    . Cardiac defibrillator placement    . Abdominal hysterectomy    . Cardiac catheterization    . Insert / replace / remove pacemaker    . Tubal ligation    . Hernia repair    . Colonoscopy N/A 04/12/2013    Procedure: COLONOSCOPY;  Surgeon: Beryle Beams, MD;  Location: [facility] ENDOSCOPY;  Service: Endoscopy;  Laterality: N/A;  pt.has defibrilator  . Left heart catheterization with coronary angiogram N/A 04/02/2012    Procedure: LEFT HEART CATHETERIZATION WITH CORONARY ANGIOGRAM;  Surgeon: Hillary Bow, MD;  Location: [facility] CATH LAB;  Service: Cardiovascular;  Laterality: N/A;  . Implantable cardioverter defibrillator (icd) generator change N/A 04/02/2014    Procedure: ICD GENERATOR CHANGE;  Surgeon: Evans Lance, MD;  Location: [facility] CATH LAB;  Service: Cardiovascular;  Laterality: N/A;   Family History  Problem Relation Age of Onset  . Stroke Mother   . Seizures Father   . Diabetes Sister   .  Asthma Maternal Aunt     aunts  . Asthma Maternal Uncle     uncles  . Heart disease Father   . Heart disease Paternal Aunt     aunts  . Heart disease Paternal Uncle     uncles  . Heart disease Maternal Aunt     aunts  . Heart disease Maternal Uncle     uncles  . Heart disease Maternal Grandfather    History  Substance Use Topics  . Smoking status: Light Tobacco Smoker -- 0.25 packs/day for 35 years    Types: Cigarettes  . Smokeless tobacco: Not on file     Comment: currently smoking 3-4 cigs daily or less.  . Alcohol Use: No   OB History    No data available     Review of Systems  All other systems reviewed and are  negative.     Allergies  Avandia; Hydrocortisone; and Pioglitazone  Home Medications   Prior to Admission medications   Medication Sig Start Date End Date Taking? Authorizing Provider  aspirin EC 81 MG tablet Take 81 mg by mouth daily.   Yes Historical Provider, MD  atorvastatin (LIPITOR) 80 MG tablet Take 1 tablet (80 mg total) by mouth at bedtime. 08/19/14  Yes Wilber Oliphant, MD  furosemide (LASIX) 20 MG tablet Take 1 tablet (20 mg total) by mouth daily. 08/08/14  Yes Alexa Marvel Plan, MD  gabapentin (NEURONTIN) 300 MG capsule Take 1 capsule (300 mg total) by mouth at bedtime. 07/04/14  Yes Nischal Narendra, MD  glucose blood (ONE TOUCH ULTRA TEST) test strip Use as instructed by doctor up to 3x daily,dx code 250.02 insulin requiring 05/20/13  Yes Wilber Oliphant, MD  guaiFENesin-codeine 100-10 MG/5ML syrup Take 5 mLs by mouth every 6 (six) hours as needed for cough. 08/08/14  Yes Alexa Marvel Plan, MD  insulin glargine (LANTUS) 100 UNIT/ML injection Inject 0.44 mLs (44 Units total) into the skin daily with breakfast. 09/16/14  Yes Wilber Oliphant, MD  insulin lispro (HUMALOG) 100 UNIT/ML injection Inject 10 units with breakfast and 16 units with lunch and 10 units with dinner 09/02/14  Yes Wilber Oliphant, MD  lisinopril (PRINIVIL,ZESTRIL) 10 MG tablet Take 1 tablet (10 mg total) by mouth daily. 08/14/14  Yes Wilber Oliphant, MD  metoCLOPramide (REGLAN) 5 MG tablet Take 1 tablet by mouth daily. 07/14/14  Yes Historical Provider, MD  metoprolol succinate (TOPROL-XL) 50 MG 24 hr tablet Take 1 tablet (50 mg total) by mouth daily. Take with or immediately following a meal. 08/14/14  Yes Wilber Oliphant, MD  nitroGLYCERIN (NITROSTAT) 0.4 MG SL tablet Place 0.4 mg under the tongue every 5 (five) minutes as needed for chest pain.   Yes Historical Provider, MD  ondansetron (ZOFRAN-ODT) 4 MG disintegrating tablet Take 4 mg by mouth every 8 (eight) hours as needed for nausea.  05/15/14  Yes  Historical Provider, MD  albuterol (VENTOLIN HFA) 108 (90 BASE) MCG/ACT inhaler Inhale 2 puffs into the lungs every 6 (six) hours as needed. Shortness of breath 07/09/13   Wilber Oliphant, MD  amLODipine (NORVASC) 10 MG tablet Take 1 tablet (10 mg total) by mouth daily. 08/08/14   Alexa Marvel Plan, MD   BP 154/101 mmHg  Pulse 104  Temp(Src) 98.3 F (36.8 C) (Oral)  Resp 14  SpO2 99% Physical Exam  Constitutional: She appears well-nourished.  Morbidly obese female sitting on the bed is not appear to be in acute distress  HENT:  Head:  Normocephalic and atraumatic.  Right Ear: External ear normal.  Left Ear: External ear normal.  Nose: Nose normal.  Eyes: Conjunctivae and EOM are normal. Pupils are equal, round, and reactive to light.  Neck: Normal range of motion. Neck supple.  Cardiovascular: Tachycardia present.   Pulmonary/Chest: Effort normal and breath sounds normal. No respiratory distress. She has no wheezes. She has no rales. She exhibits no tenderness.  Abdominal: Soft. Bowel sounds are normal. She exhibits no distension and no mass. There is tenderness. There is no rebound and no guarding.    Nursing note and vitals reviewed.   ED Course  Procedures (including critical care time) Labs Review Labs Reviewed  CBC WITH DIFFERENTIAL - Abnormal; Notable for the following:    RBC 5.49 (*)    All other components within normal limits  COMPREHENSIVE METABOLIC PANEL - Abnormal; Notable for the following:    Glucose, Bld 249 (*)    GFR calc non Af Amer 55 (*)    GFR calc Af Amer 64 (*)    All other components within normal limits  URINALYSIS, ROUTINE W REFLEX MICROSCOPIC - Abnormal; Notable for the following:    Color, Urine ORANGE (*)    APPearance CLOUDY (*)    Specific Gravity, Urine 1.034 (*)    Glucose, UA 100 (*)    Bilirubin Urine LARGE (*)    Ketones, ur 15 (*)    Protein, ur 100 (*)    Nitrite POSITIVE (*)    Leukocytes, UA TRACE (*)    All other components  within normal limits  URINE MICROSCOPIC-ADD ON - Abnormal; Notable for the following:    Squamous Epithelial / LPF FEW (*)    Bacteria, UA FEW (*)    Casts HYALINE CASTS (*)    All other components within normal limits  LIPASE, BLOOD  OCCULT BLOOD X 1 CARD TO LAB, STOOL  BRAIN NATRIURETIC PEPTIDE  I-STAT TROPOININ, ED    Imaging Review Dg Chest 2 View  10/15/2014   CLINICAL DATA:  Shortness of breath, LEFT side abdominal pain extending into chest, nausea and vomiting, symptoms for 2 days, history hypertension, diabetes, smoking, defibrillator placement, COPD, CHF, non ischemic cardiomyopathy  EXAM: CHEST  2 VIEW  COMPARISON:  08/07/2014  FINDINGS: LEFT subclavian AICD lead projects at RIGHT ventricle.  Enlargement of cardiac silhouette.  Mild tortuosity of thoracic aorta.  Mediastinal contours and pulmonary vascularity otherwise normal.  Lungs minimally hyperinflated but clear.  No pleural effusion or pneumothorax.  Bones demineralized with mild broad based levoconvex thoracolumbar scoliosis.  IMPRESSION: Enlargement of cardiac silhouette post AICD.  No acute abnormalities.   Electronically Signed   By: Lavonia Dana M.D.   On: 10/15/2014 15:47   Ct Abdomen Pelvis W Contrast  10/15/2014   CLINICAL DATA:  Left-sided abdominal pain, nausea, vomiting  EXAM: CT ABDOMEN AND PELVIS WITH CONTRAST  TECHNIQUE: Multidetector CT imaging of the abdomen and pelvis was performed using the standard protocol following bolus administration of intravenous contrast.  CONTRAST:  188mL OMNIPAQUE IOHEXOL 300 MG/ML  SOLN  COMPARISON:  01/17/2012  FINDINGS: There are patchy ground-glass opacities at bilateral lung bases.  The liver demonstrates no focal abnormality. There is a calcification along the dome of the liver. There is no intrahepatic or extrahepatic biliary ductal dilatation. The gallbladder is normal. The spleen demonstrates no focal abnormality. The kidneys, adrenal glands and pancreas are normal. The bladder is  unremarkable.  The stomach, duodenum, small intestine, and large intestine demonstrate no contrast  extravasation or dilatation. There is a normal caliber appendix in the right lower quadrant without periappendiceal inflammatory changes. There is no pneumoperitoneum, pneumatosis, or portal venous gas. There is no abdominal or pelvic free fluid. There is no lymphadenopathy.  The abdominal aorta is normal in caliber.  There are no lytic or sclerotic osseous lesions. There is bilateral facet arthropathy at L4-5.  IMPRESSION: 1. There is no abdominal or pelvic pathology. 2. Normal appendix. 3. Patchy ground-glass opacities at bilateral lung bases which may be secondary to an infectious or inflammatory etiology.   Electronically Signed   By: Kathreen Devoid   On: 10/15/2014 20:08     EKG Interpretation   Date/Time:  Wednesday October 15 2014 15:00:52 EST Ventricular Rate:  109 PR Interval:  146 QRS Duration: 84 QT Interval:  374 QTC Calculation: 503 R Axis:   51 Text Interpretation:  Sinus tachycardia with Premature ventricular  complexes or Fusion complexes Right atrial enlargement T wave abnormality,  consider lateral ischemia Abnormal ECG Confirmed by Santana Edell MD, Andee Poles  [id number]) on 10/15/2014 4:31:05 PM      MDM   Final diagnoses:  Abdominal pain  CAP (community acquired pneumonia)  Hyperglycemia    This is a 64 year old female who presents with some left-sided abdominal pain, nausea, and vomiting. She also endorsed some dyspnea. Workup here is significant for a urinary tract infection. EKG is abnormal but unchanged from prior and patient has normal troponins.  Patient with by basilar glass ground past disease seen only on CT scan of abdomen not seen on films. She's had some dyspnea and will therefore be treated for community-acquired pneumonia. Do not see any source of the patient's abdominal pain after CT done. She has been hemodynamically stable and feels greatly improved here. She is  discharged home in improved condition advised of the need for close follow-up and return precautions.    Shaune Pollack, MD 10/16/14 0001

## 2014-10-15 NOTE — ED Notes (Signed)
Pt's O2 sat dropped to 83, pt placed on 3L [place], O2 99% now

## 2014-10-15 NOTE — ED Notes (Signed)
Dr Jeanell Sparrow made aware that pt is done with contrast and is ready to be transported to CT

## 2014-10-15 NOTE — ED Notes (Addendum)
Pt reports having episodes of left side abd pain and n/v, has been seen several times over the past few months for same. Reports this episode started on Monday and unable to keep fluids or meds down. Denies diarrhea. Also reports sometimes having chest pain and currently feels sob.

## 2014-10-15 NOTE — ED Notes (Signed)
Called CT, MD made aware.

## 2014-10-15 NOTE — ED Notes (Signed)
Pt is a difficult stick, will call phlebotomy

## 2014-10-15 NOTE — ED Notes (Signed)
MD at bedside. 

## 2014-10-23 ENCOUNTER — Ambulatory Visit (INDEPENDENT_AMBULATORY_CARE_PROVIDER_SITE_OTHER): Payer: Medicare Other | Admitting: Internal Medicine

## 2014-10-23 ENCOUNTER — Encounter: Payer: Self-pay | Admitting: Internal Medicine

## 2014-10-23 ENCOUNTER — Ambulatory Visit: Payer: Medicare Other | Admitting: Internal Medicine

## 2014-10-23 VITALS — BP 162/92 | HR 103 | Temp 97.8°F | Ht 60.0 in | Wt 217.6 lb

## 2014-10-23 DIAGNOSIS — I129 Hypertensive chronic kidney disease with stage 1 through stage 4 chronic kidney disease, or unspecified chronic kidney disease: Secondary | ICD-10-CM | POA: Diagnosis not present

## 2014-10-23 DIAGNOSIS — E1122 Type 2 diabetes mellitus with diabetic chronic kidney disease: Secondary | ICD-10-CM

## 2014-10-23 DIAGNOSIS — E1165 Type 2 diabetes mellitus with hyperglycemia: Secondary | ICD-10-CM | POA: Diagnosis not present

## 2014-10-23 DIAGNOSIS — R1032 Left lower quadrant pain: Secondary | ICD-10-CM | POA: Diagnosis not present

## 2014-10-23 DIAGNOSIS — N182 Chronic kidney disease, stage 2 (mild): Secondary | ICD-10-CM | POA: Diagnosis not present

## 2014-10-23 DIAGNOSIS — Z794 Long term (current) use of insulin: Secondary | ICD-10-CM

## 2014-10-23 DIAGNOSIS — F17210 Nicotine dependence, cigarettes, uncomplicated: Secondary | ICD-10-CM

## 2014-10-23 DIAGNOSIS — I10 Essential (primary) hypertension: Secondary | ICD-10-CM

## 2014-10-23 LAB — GLUCOSE, CAPILLARY: Glucose-Capillary: 280 mg/dL — ABNORMAL HIGH (ref 70–99)

## 2014-10-23 LAB — POCT GLYCOSYLATED HEMOGLOBIN (HGB A1C): Hemoglobin A1C: 11.2

## 2014-10-23 MED ORDER — METFORMIN HCL 500 MG PO TABS: 500.0000 mg | ORAL_TABLET | Freq: Every day | ORAL | Status: DC

## 2014-10-23 MED ORDER — CYCLOBENZAPRINE HCL 5 MG PO TABS: 5.0000 mg | ORAL_TABLET | Freq: Three times a day (TID) | ORAL | Status: DC | PRN

## 2014-10-23 NOTE — Patient Instructions (Signed)
-  Start taking flexiril 5 mg three times daily as needed for muscle spasms, it may make you drowsy, so take it at night and don't drive  -Start taking metformin 500 mg daily, you can increase to twice a day after 1 week if it agrees with you for your diabetes -Will see you back in 1-2 months for your diabetes, nice seeing you again!   General Instructions:   Please bring your medicines with you each time you come to clinic.  Medicines may include prescription medications, over-the-counter medications, herbal remedies, eye drops, vitamins, or other pills.   Progress Toward Treatment Goals:  Treatment Goal 09/02/2014  Hemoglobin A1C (No Data)  Blood pressure improved  Stop smoking smoking the same amount  Prevent falls -    Self Care Goals & Plans:  Self Care Goal 10/23/2014  Manage my medications take my medicines as prescribed; bring my medications to every visit; refill my medications on time; follow the sick day instructions if I am sick  Monitor my health keep track of my blood glucose; keep track of my blood pressure; keep track of my weight; check my feet daily  Eat healthy foods eat more vegetables; eat fruit for snacks and desserts; eat baked foods instead of fried foods; eat smaller portions; eat foods that are low in salt; drink diet soda or water instead of juice or soda  Be physically active find an activity I enjoy  Stop smoking -    Home Blood Glucose Monitoring 09/02/2014  Check my blood sugar 3 times a day  When to check my blood sugar before meals     Care Management & Community Referrals:  Referral 01/30/2013  Referrals made for care management support diabetes educator; nutritionist  Referrals made to community resources smoking cessation; nutrition

## 2014-10-23 NOTE — Progress Notes (Signed)
Patient ID: [name], female   DOB: Aug 16, 1951, 64 y.o.   MRN: [id number]    Subjective:   Patient ID: [name] female   DOB: 1951-09-16 64 y.o.   MRN: [id number]  HPI: [name] is a 64 y.o. woman with past medical history of hypertension, insulin-dependent Type II DM, NICM with ICD, combined chronic CHF, and COPD who presents with follow-up of recent ED admission.   She was recently seen in the ED on 10/15/14 for LLQ abdominal pain, nausea with vomiting (no hematemesis), dyspnea, and mild LE edema. She had CT abdomen imaging which revealed patchy bilateral opacities at bases (present since CTA in 2011, possible due to vascular congestion vs atelectasis). She was consequently given azithromycin to take for 5 days for possible pneumonia although she did not have fever, chills, cough, or pleuritic chest pain. She was also found to have hyperglycemia with glucose of 249. She reports her nausea and vomiting have resolved (was given zofran PRN) but continues to have LLQ pain with radiation to the left side of her lower back that feels like muscle spasms. She denies pain in relation to eating. She denies urinary symptoms and UA in the ED did not reveal hematuria, pyuria, or significant bacteruria. She has no prior history of nephrolithiasis and denies flank pain. She also denies history of low back pain and denies heavy lifting.  She has never had gastric empyting study but was on a trial of reglan a few months ago which she is no longer taking. She is also no longer on PPI therapy. Her last A1c was 12.7 on 07/12/14. She reports compliance with taking Lantus 44 U and Humalog 07/18/09 with meals. She checks her blood sugar three times daily with average blood sugar in 200's. She forgot to bring her glucose meter in today. She denies symptomatic hypoglycemia but reports having chronic polyuria, polydipsia, blurry vision, and neuropathy (controlled on gabapentin). She denies polyphagia or foot  injury/ulcers. She tries to follow a healthy diet and exercise regularly. She has lost weight intentionally(16 lbs since Dec 1). She reports she was on metformin in August but was taken off of it during a hospitalization for unclear reasons. She has had mild GI upset (loose stools) while taking it in the past.      She reports compliance with taking amlodipine, furosemide, lisinopril, and toprol-xl for hypertension. She reports mild LE swelling at baseline but denies headache, chest pain, palpitations, or lightheadedness.     Past Medical History  Diagnosis Date  . Hyperthyroidism, subclinical   . Post menopausal syndrome   . Obesity   . Cardiac defibrillator in situ     Atlas II VR (SJM) implanted by Dr Lovena Le  . Eczema   . Lipoma   . Chronic ulcer of leg   . Hyperlipidemia   . Chronic combined systolic and diastolic heart failure     a. EF 35-40% in past;  b. Echo 7/13:  EF 45-50%, Gr 2 diast dysfn, mild AI, mild MAC, trivial MR, mild LAE, PASP 47.  [name] NICM (nonischemic cardiomyopathy)   . Diabetes mellitus   . HTN (hypertension)   . Elevated alkaline phosphatase level     GGT and 5'nucleotidase 8/13 normal  . Hx of cardiac cath     a. [place] 2003 normal;  b. LHC 6/13:  Mild calcification in the LM, o/w normal coronary arteries, EF 45%.   . Sleep apnea     pt denies 04/12/2013  .  COPD (chronic obstructive pulmonary disease)     O2 at night  . Automatic implantable cardioverter-defibrillator in situ   . CHF (congestive heart failure)   . Depression   . Implantable cardioverter-defibrillator (ICD) generator end of life   . ATN (acute tubular necrosis) 07/15/2014   Current Outpatient Prescriptions  Medication Sig Dispense Refill  . albuterol (VENTOLIN HFA) 108 (90 BASE) MCG/ACT inhaler Inhale 2 puffs into the lungs every 6 (six) hours as needed. Shortness of breath 1 Inhaler 6  . amLODipine (NORVASC) 10 MG tablet Take 1 tablet (10 mg total) by mouth daily. 30 tablet 3  . aspirin  EC 81 MG tablet Take 81 mg by mouth daily.    [name] atorvastatin (LIPITOR) 80 MG tablet Take 1 tablet (80 mg total) by mouth at bedtime. 90 tablet 5  . azithromycin (ZITHROMAX) 250 MG tablet Take 1 tablet (250 mg total) by mouth daily. Take first 2 tablets together, then 1 every day until finished. 6 tablet 0  . furosemide (LASIX) 20 MG tablet Take 1 tablet (20 mg total) by mouth daily. 30 tablet 3  . gabapentin (NEURONTIN) 300 MG capsule Take 1 capsule (300 mg total) by mouth at bedtime. 30 capsule 11  . glucose blood (ONE TOUCH ULTRA TEST) test strip Use as instructed by doctor up to 3x daily,dx code 250.02 insulin requiring 100 each 5  . guaiFENesin-codeine 100-10 MG/5ML syrup Take 5 mLs by mouth every 6 (six) hours as needed for cough. 120 mL 0  . insulin glargine (LANTUS) 100 UNIT/ML injection Inject 0.44 mLs (44 Units total) into the skin daily with breakfast. 20 mL 3  . insulin lispro (HUMALOG) 100 UNIT/ML injection Inject 10 units with breakfast and 16 units with lunch and 10 units with dinner 10 mL 5  . lisinopril (PRINIVIL,ZESTRIL) 10 MG tablet Take 1 tablet (10 mg total) by mouth daily. 30 tablet 3  . metoCLOPramide (REGLAN) 5 MG tablet Take 1 tablet by mouth daily.  0  . metoprolol succinate (TOPROL-XL) 50 MG 24 hr tablet Take 1 tablet (50 mg total) by mouth daily. Take with or immediately following a meal. 30 tablet 3  . nitroGLYCERIN (NITROSTAT) 0.4 MG SL tablet Place 0.4 mg under the tongue every 5 (five) minutes as needed for chest pain.    [name] ondansetron (ZOFRAN ODT) 8 MG disintegrating tablet Take 1 tablet (8 mg total) by mouth every 8 (eight) hours as needed for nausea or vomiting. 20 tablet 0   No current facility-administered medications for this visit.   Family History  Problem Relation Age of Onset  . Stroke Mother   . Seizures Father   . Diabetes Sister   . Asthma Maternal Aunt     aunts  . Asthma Maternal Uncle     uncles  . Heart disease Father   . Heart disease  Paternal Aunt     aunts  . Heart disease Paternal Uncle     uncles  . Heart disease Maternal Aunt     aunts  . Heart disease Maternal Uncle     uncles  . Heart disease Maternal Grandfather    History   Social History  . Marital Status: Widowed    Spouse Name: Alroy Dust    Number of Children: N/A  . Years of Education: 11   Occupational History  . disabled    Social History Main Topics  . Smoking status: Light Tobacco Smoker -- 0.25 packs/day for 35 years    Types: Cigarettes  .  Smokeless tobacco: None     Comment: currently smoking 3-4 cigs daily or less.  . Alcohol Use: No  . Drug Use: No  . Sexual Activity: None   Other Topics Concern  . None   Social History Narrative   ** Merged History Encounter **       Married   Review of Systems:Review of Systems  Constitutional: Positive for weight loss (intentional). Negative for fever and chills.  HENT: Negative for congestion.   Eyes: Positive for blurred vision (chronic).  Respiratory: Negative for cough, shortness of breath and wheezing.   Cardiovascular: Positive for leg swelling. Negative for chest pain and orthopnea.  Gastrointestinal: Positive for abdominal pain. Negative for nausea, vomiting, diarrhea, constipation and blood in stool.  Genitourinary: Negative for dysuria, urgency, frequency, hematuria and flank pain.       Chronic polyuria  Musculoskeletal: Negative for back pain.       Left-sided lumbar muscle spasm  Neurological: Positive for sensory change (chronic neuropathy). Negative for dizziness and headaches.  Endo/Heme/Allergies: Positive for polydipsia (chronic).    Objective:  Physical Exam: Filed Vitals:   10/23/14 0936  BP: 162/92  Pulse: 103  Temp: 97.8 F (36.6 C)  TempSrc: Oral  Height: 5' (1.524 m)  Weight: 217 lb 9.6 oz (98.703 kg)  SpO2: 98%    Physical Exam  Constitutional: She is oriented to person, place, and time. She appears well-developed and well-nourished. No distress.    HENT:  Head: Normocephalic and atraumatic.  Eyes: EOM are normal.  Neck: Normal range of motion. Neck supple.  Cardiovascular: Normal rate and regular rhythm.   Pulmonary/Chest: Effort normal and breath sounds normal. No respiratory distress. She has no wheezes. She has no rales.  Abdominal: Soft. Bowel sounds are normal. She exhibits no distension. There is tenderness (Mild tenderness to palpation of LLQ). There is no rebound and no guarding.  Genitourinary:  No CVA tenderness.  Musculoskeletal: Normal range of motion. She exhibits edema (Trace b/l LE). She exhibits no tenderness.  Left lumber paraspinal tenderness with mild stiffness. Straight leg test negative bilaterally.   Neurological: She is alert and oriented to person, place, and time.  Skin: Skin is warm and dry. No rash noted. She is not diaphoretic. No erythema. No pallor.  Psychiatric: She has a normal mood and affect. Her behavior is normal. Judgment and thought content normal.    Assessment & Plan:   Please see problem list for problem-based assessment and plan

## 2014-10-23 NOTE — Progress Notes (Signed)
Case discussed with Dr. Rabbani at the time of the visit.  We reviewed the resident's history and exam and pertinent patient test results.  I agree with the assessment, diagnosis, and plan of care documented in the resident's note. 

## 2014-10-25 DIAGNOSIS — R1032 Left lower quadrant pain: Secondary | ICD-10-CM | POA: Insufficient documentation

## 2014-10-25 NOTE — Assessment & Plan Note (Addendum)
Assessment: Pt with last A1c of 12.7 on 04/08/14 compliant with insulin therapy with no recent symptomatic hypoglycemia who presents with CBG of 280 and slightly improved A1c of 11.2.  Plan: -A1c 11.2 not at goal <7, start metformin 500 mg daily and instructed to increase to BID after 1 week if tolerates well (stable CKD Stage 2 with GFR 64 and Cr 1.06). Continue Lantus 44 U daily and Humalog 07/18/09 with meals. Pt instructed to bring in glucose meter at next visit for insulin adjustment if necessary.  -BP 162/92 not at goal <140/90, pt stated she had not taken her AM meds yet. Continue amlodipine 10 mg daily, lisinopril 40 mg daily, furosemide 20 mg daily, and metoprolol succinate 50 mg daily  -LDL 60 at goal <100, continue atorvastatin 80 mg daily -Last annual eye exam on 07/21/14 with no retinopathy  -Last annual foot exam on 07/21/14 -Last annual urine microalbumin on 04/08/14 with proteinuria (184.6 mg), continue lisinopril 40 mg daily  -Continue gabapentin 300 mg for chronic peripheral neuropathy -BMI 442.5 not at goal <30, encourage weight loss

## 2014-10-25 NOTE — Assessment & Plan Note (Addendum)
Assessment: Pt with recent onset of LLQ abdominal pain with negative CT abdomen imaging who presents with concomitant left paraspinal lumbar tenderness possibly due to muscle spasms.   Plan: -Prescribe trial of cyclobenzaprine 5 mg TID PRN muscle spasms in setting of left lumbar paraspinal tenderness (consider lumbar spine imaging, none prior) -Symptoms may also be related to undiagnosed gastroparesis in setting of uncontrolled Type 2 DM and chronic nausea & vomiting however denies relation of symptoms to eating

## 2014-10-25 NOTE — Assessment & Plan Note (Signed)
Assessment: Pt with moderately well-controlled hypertension compliant with four-class (CCB, BB, diuretic, & ACEi) anti-hypertensive therapy who presents with blood pressure of 162/92 before taking AM medications.   Plan:  -BP 162/92 not at goal <140/90, pt stated she had not taken her AM meds yet -Continue amlodipine 10 mg daily, lisinopril 40 mg daily, furosemide 20 mg daily, and metoprolol succinate 50 mg daily  -Last CMP on 10/15/14 with stable CKD Stage 2

## 2014-11-26 ENCOUNTER — Ambulatory Visit (INDEPENDENT_AMBULATORY_CARE_PROVIDER_SITE_OTHER): Payer: Medicare Other | Admitting: *Deleted

## 2014-11-26 DIAGNOSIS — I428 Other cardiomyopathies: Secondary | ICD-10-CM

## 2014-11-26 DIAGNOSIS — I429 Cardiomyopathy, unspecified: Secondary | ICD-10-CM

## 2014-11-26 NOTE — Progress Notes (Signed)
Remote ICD transmission.   

## 2014-11-27 ENCOUNTER — Other Ambulatory Visit: Payer: Self-pay | Admitting: Dermatology

## 2014-11-27 DIAGNOSIS — D1723 Benign lipomatous neoplasm of skin and subcutaneous tissue of right leg: Secondary | ICD-10-CM | POA: Diagnosis not present

## 2014-11-28 LAB — MDC_IDC_ENUM_SESS_TYPE_REMOTE
Battery Remaining Longevity: 94 mo
Battery Remaining Percentage: 94 %
Battery Voltage: 3.19 V
Brady Statistic RV Percent Paced: 0 %
Date Time Interrogation Session: 20160224070018
HighPow Impedance: 39 Ohm
HighPow Impedance: 39 Ohm
Implantable Pulse Generator Serial Number: 7206538
Lead Channel Impedance Value: 410 Ohm
Lead Channel Pacing Threshold Amplitude: 0.75 V
Lead Channel Pacing Threshold Pulse Width: 0.5 ms
Lead Channel Sensing Intrinsic Amplitude: 11.9 mV
Lead Channel Setting Pacing Amplitude: 2.5 V
Lead Channel Setting Pacing Pulse Width: 0.5 ms
Lead Channel Setting Sensing Sensitivity: 0.5 mV
Zone Setting Detection Interval: 250 ms
Zone Setting Detection Interval: 300 ms

## 2014-12-05 ENCOUNTER — Encounter: Payer: Self-pay | Admitting: Cardiology

## 2014-12-10 ENCOUNTER — Encounter: Payer: Self-pay | Admitting: Internal Medicine

## 2014-12-23 ENCOUNTER — Encounter: Payer: Self-pay | Admitting: Internal Medicine

## 2014-12-23 ENCOUNTER — Ambulatory Visit (INDEPENDENT_AMBULATORY_CARE_PROVIDER_SITE_OTHER): Payer: Medicare Other | Admitting: Internal Medicine

## 2014-12-23 VITALS — BP 183/106 | HR 97 | Temp 98.0°F | Ht 60.0 in | Wt 234.4 lb

## 2014-12-23 DIAGNOSIS — Z0000 Encounter for general adult medical examination without abnormal findings: Secondary | ICD-10-CM

## 2014-12-23 DIAGNOSIS — E1122 Type 2 diabetes mellitus with diabetic chronic kidney disease: Secondary | ICD-10-CM

## 2014-12-23 DIAGNOSIS — I10 Essential (primary) hypertension: Secondary | ICD-10-CM | POA: Diagnosis not present

## 2014-12-23 DIAGNOSIS — Z1239 Encounter for other screening for malignant neoplasm of breast: Secondary | ICD-10-CM | POA: Diagnosis not present

## 2014-12-23 DIAGNOSIS — N182 Chronic kidney disease, stage 2 (mild): Secondary | ICD-10-CM

## 2014-12-23 NOTE — Patient Instructions (Signed)
General Instructions:  Thank you for bringing your medicines today. This helps [place] keep you safe from mistakes.  Dear [name],  We will check your blood work today, if your kidney function is stable, we will increase your lisinopril to 20mg  daily and you will need to return to clinic in one month for follow up. Please also try to have our blood pressure checked at home and we can try to get [facility] involved as well to help you. PLEASE DO NOT SKIP YOUR BLOOD PRESSURE MEDICATIONS  Please get your mammogram done  Cut back on soda's and starchy foods and decrease salt intake along with working on weight loss, continue your insulin regimen and restart metformin 500mg  daily. If your blood sugars start going low <70 please let me know and hold your insulin.   Please stop smoking and let me know if I can be of more assistance, call the quitline as well 1800quitnow   Progress Toward Treatment Goals:  Treatment Goal 12/23/2014  Hemoglobin A1C -  Blood pressure deteriorated  Stop smoking smoking the same amount  Prevent falls -    Self Care Goals & Plans:  Self Care Goal 12/23/2014  Manage my medications take my medicines as prescribed; bring my medications to every visit; refill my medications on time; follow the sick day instructions if I am sick  Monitor my health keep track of my blood glucose; bring my glucose meter and log to each visit; keep track of my blood pressure; keep track of my weight; check my feet daily  Eat healthy foods eat more vegetables; eat fruit for snacks and desserts; eat baked foods instead of fried foods; eat foods that are low in salt; drink diet soda or water instead of juice or soda; eat smaller portions  Be physically active find an activity I enjoy  Stop smoking call [facility] (1-800-QUIT-NOW)    Home Blood Glucose Monitoring 12/23/2014  Check my blood sugar 3 times a day  When to check my blood sugar before meals     Care Management & Community  Referrals:  Referral 01/30/2013  Referrals made for care management support diabetes educator; nutritionist  Referrals made to community resources smoking cessation; nutrition     Low Blood Sugar Low blood sugar (hypoglycemia) means that the level of sugar in your blood is lower than it should be. Signs of low blood sugar include:  Getting sweaty.  Feeling hungry.  Feeling dizzy or weak.  Feeling sleepier than normal.  Feeling nervous.  Headaches.  Having a fast heartbeat. Low blood sugar can happen fast and can be an emergency. Your doctor can do tests to check your blood sugar level. You can have low blood sugar and not have diabetes. HOME CARE  Check your blood sugar as told by your doctor. If it is less than 70 mg/dl or as told by your doctor, take 1 of the following:  3 to 4 glucose tablets.   cup clear juice.   cup soda pop, not diet.  1 cup milk.  5 to 6 hard candies.  Recheck blood sugar after 15 minutes. Repeat until it is at the right level.  Eat a snack if it is more than 1 hour until the next meal.  Only take medicine as told by your doctor.  Do not skip meals. Eat on time.  Do not drink alcohol except with meals.  Check your blood glucose before driving.  Check your blood glucose before and after exercise.  Always  carry treatment with you, such as glucose pills.  Always wear a medical alert bracelet if you have diabetes. GET HELP RIGHT AWAY IF:   Your blood glucose goes below 70 mg/dl or as told by your doctor, and you:  Are confused.  Are not able to swallow.  Pass out (faint).  You cannot treat yourself. You may need someone to help you.  You have low blood sugar problems often.  You have problems from your medicines.  You are not feeling better after 3 to 4 days.  You have vision changes. MAKE SURE YOU:   Understand these instructions.  Will watch this condition.  Will get help right away if you are not doing well or get  worse. Document Released: 12/14/2009 Document Revised: 12/12/2011 Document Reviewed: 12/14/2009 [facility] Patient Information 2015 [place], [place]. This information is not intended to replace advice given to you by your health care provider. Make sure you discuss any questions you have with your health care provider.

## 2014-12-23 NOTE — Assessment & Plan Note (Addendum)
BP Readings from Last 3 Encounters:  12/23/14 183/106  10/23/14 162/92  10/15/14 188/115   Lab Results  Component Value Date   NA 136 10/15/2014   K 3.8 10/15/2014   CREATININE 1.06 10/15/2014   Assessment: Blood pressure control: severely elevated Progress toward BP goal:  deteriorated Comments: repeated manually down to 170/104 on left arm. Admits to medication non-compliance. Has not taken any of her medications for past 2 days but says usually she does not skip her BB.  Plan: Medications:  will check bmet today, if renal function stable, will increase lisinopril to 20mg  and continue norvasc 10mg , lasix 20mg  and toprol xl 50mg  daily Educational resources provided: brochure, handout, video Self management tools provided:   Other plans: need to increase ACEi if renal function allows. Could also consider change from lasix to HCTZ for improved BP control. Coming off lasix may also help with compliance as frequency is a complaint, however, HCTZ is also a diuretic and she would benefit from continued diuretic therapy. BP has been slowly trending up over past several months. She is not able to check her BP but I discussed getting [facility] involved and she did agree to see if we can help check BP at home and work on management. Ultimately, ambulatory monitoring would be ideal for accurate idea of BP at home given that she usually has not taken her medications when she comes to the office but reviewing her trend it likely is still above goal. I have also encouraged her to take her medications prior to office visits.   Addendum 3/23: BMET with improved renal function. Will proceed with Lisinopril 20mg  and repeat in lab work to check electrolytes and renal function and BP check in 1-2 weeks is advised. I discussed this with her on the phone today and she is in agreement. Will have front desk set up appointment.

## 2014-12-23 NOTE — Assessment & Plan Note (Signed)
Mammogram referral today

## 2014-12-23 NOTE — Progress Notes (Signed)
Subjective:   Patient ID: [name] female   DOB: 1951/06/28 64 y.o.   MRN: [id number]  HPI: [name] is a 64 y.o. female with extensive PMH as listed below presenting to opc today for HTN follow up visit.   HTN--BP remains elevated. She admits to medication non-compliance at times with the exception of her BB and last took her medicine two day ago. She says her medications make her urinate so she takes them when she is at home but did not take today before coming to the office due to risk of having to urinate frequently. Admits to eating popcorn at night that has a lot of salt.   DM2--reports better controlled CBGs. Says her lowest was in the 70s and asymptomatic and highest in the 300s, but usually between 100-200s as per her meter records as well. She has been working hard on her control and knows not to take humolog if blood sugars are low and denies any recent hypoglycemic episodes. She has unfortunately put on weight again but says she will work on that again.   Of note, she has also has more deaths among her family and friends which has been contributing to stress as well.  Past Medical History  Diagnosis Date  . Hyperthyroidism, subclinical   . Post menopausal syndrome   . Obesity   . Cardiac defibrillator in situ     Atlas II VR (SJM) implanted by Dr Lovena Le  . Eczema   . Lipoma   . Chronic ulcer of leg   . Hyperlipidemia   . Chronic combined systolic and diastolic heart failure     a. EF 35-40% in past;  b. Echo 7/13:  EF 45-50%, Gr 2 diast dysfn, mild AI, mild MAC, trivial MR, mild LAE, PASP 47.  [name] NICM (nonischemic cardiomyopathy)   . Diabetes mellitus   . HTN (hypertension)   . Elevated alkaline phosphatase level     GGT and 5'nucleotidase 8/13 normal  . Hx of cardiac cath     a. [place] 2003 normal;  b. LHC 6/13:  Mild calcification in the LM, o/w normal coronary arteries, EF 45%.   . Sleep apnea     pt denies 04/12/2013  . COPD (chronic obstructive pulmonary  disease)     O2 at night  . Automatic implantable cardioverter-defibrillator in situ   . CHF (congestive heart failure)   . Depression   . Implantable cardioverter-defibrillator (ICD) generator end of life   . ATN (acute tubular necrosis) 07/15/2014   Current Outpatient Prescriptions  Medication Sig Dispense Refill  . albuterol (VENTOLIN HFA) 108 (90 BASE) MCG/ACT inhaler Inhale 2 puffs into the lungs every 6 (six) hours as needed. Shortness of breath 1 Inhaler 6  . amLODipine (NORVASC) 10 MG tablet Take 1 tablet (10 mg total) by mouth daily. 30 tablet 3  . aspirin EC 81 MG tablet Take 81 mg by mouth daily.    [name] atorvastatin (LIPITOR) 80 MG tablet Take 1 tablet (80 mg total) by mouth at bedtime. 90 tablet 5  . cyclobenzaprine (FLEXERIL) 5 MG tablet Take 1 tablet (5 mg total) by mouth 3 (three) times daily as needed for muscle spasms. 30 tablet 0  . furosemide (LASIX) 20 MG tablet Take 1 tablet (20 mg total) by mouth daily. 30 tablet 3  . gabapentin (NEURONTIN) 300 MG capsule Take 1 capsule (300 mg total) by mouth at bedtime. 30 capsule 11  . glucose blood (ONE TOUCH ULTRA TEST) test  strip Use as instructed by doctor up to 3x daily,dx code 250.02 insulin requiring 100 each 5  . guaiFENesin-codeine 100-10 MG/5ML syrup Take 5 mLs by mouth every 6 (six) hours as needed for cough. 120 mL 0  . insulin glargine (LANTUS) 100 UNIT/ML injection Inject 0.44 mLs (44 Units total) into the skin daily with breakfast. 20 mL 3  . insulin lispro (HUMALOG) 100 UNIT/ML injection Inject 10 units with breakfast and 16 units with lunch and 10 units with dinner 10 mL 5  . lisinopril (PRINIVIL,ZESTRIL) 10 MG tablet Take 1 tablet (10 mg total) by mouth daily. 30 tablet 3  . metFORMIN (GLUCOPHAGE) 500 MG tablet Take 1 tablet (500 mg total) by mouth daily with breakfast. 30 tablet 11  . metoCLOPramide (REGLAN) 5 MG tablet Take 1 tablet by mouth daily.  0  . metoprolol succinate (TOPROL-XL) 50 MG 24 hr tablet Take 1  tablet (50 mg total) by mouth daily. Take with or immediately following a meal. 30 tablet 3  . nitroGLYCERIN (NITROSTAT) 0.4 MG SL tablet Place 0.4 mg under the tongue every 5 (five) minutes as needed for chest pain.    [name] ondansetron (ZOFRAN ODT) 8 MG disintegrating tablet Take 1 tablet (8 mg total) by mouth every 8 (eight) hours as needed for nausea or vomiting. 20 tablet 0   No current facility-administered medications for this visit.   Family History  Problem Relation Age of Onset  . Stroke Mother   . Seizures Father   . Diabetes Sister   . Asthma Maternal Aunt     aunts  . Asthma Maternal Uncle     uncles  . Heart disease Father   . Heart disease Paternal Aunt     aunts  . Heart disease Paternal Uncle     uncles  . Heart disease Maternal Aunt     aunts  . Heart disease Maternal Uncle     uncles  . Heart disease Maternal Grandfather    History   Social History  . Marital Status: Widowed    Spouse Name: Alroy Dust  . Number of Children: N/A  . Years of Education: 11   Occupational History  . disabled    Social History Main Topics  . Smoking status: Light Tobacco Smoker -- 0.25 packs/day for 35 years    Types: Cigarettes  . Smokeless tobacco: Not on file     Comment: currently smoking 3-4 cigs daily or less.  . Alcohol Use: No  . Drug Use: No  . Sexual Activity: Not on file   Other Topics Concern  . None   Social History Narrative   ** Merged History Encounter **       Married   Review of Systems:  Constitutional:  Denies fever, chills  Respiratory:  Denies SOB. +DOE better than before  Cardiovascular:  Denies chest pain  Gastrointestinal:  Denies nausea, vomiting, abdominal pain  Genitourinary:  Denies dysuria  Musculoskeletal:  Denies gait problem.   Skin:  Dry  Neurological:  Denies headaches.    Objective:  Physical Exam: Filed Vitals:   12/23/14 1321  BP: 183/106  Pulse: 97  Temp: 98 F (36.7 C)  TempSrc: Oral  Height: 5' (1.524 m)    Weight: 234 lb 6.4 oz (106.323 kg)  SpO2: 97%   Vitals reviewed. General: sitting in chair, NAD HEENT: EOMI Cardiac: RRR Pulm: clear to auscultation bilaterally Abd: soft, obese, BS present Ext: moving all extremities, mild pitting edema to b/l lower extremities Neuro:  alert and oriented X3, strength and sensation to light touch equal in bilateral upper and lower extremities Skin: Dry  Assessment & Plan:  Discussed with Dr. Lynnae January

## 2014-12-23 NOTE — Assessment & Plan Note (Signed)
Lab Results  Component Value Date   HGBA1C 11.2 10/23/2014   HGBA1C 12.7* 07/12/2014   HGBA1C 10.9 04/08/2014    Assessment: Diabetes control: poor control (HgbA1C >9%) Progress toward A1C goal:    Comments: Recheck in April. Appears to have improved CBGs but weight has gone back up. Did bring meter today and comlaint with insulin. She has not been taking her metformin due to GI side effects but wishes to restart that as well.  Plan: Medications:  restart metformin 500mg  daily (will just start here for now to see how she tolerates), lantus 44 units, and humalog 07/18/09 with meals, hold if cbg's low or not eating Home glucose monitoring: Frequency: 3 times a day Timing: before meals Instruction/counseling given: reminded to bring blood glucose meter & log to each visit, reminded to bring medications to each visit, discussed the need for weight loss and discussed diet Educational resources provided: brochure, handout Self management tools provided: copy of home glucose meter download Other plans: recheck a1c next month, congratulated her on improved cbg's, counseled again to monitor for hypo and hyperglycemia

## 2014-12-24 ENCOUNTER — Telehealth: Payer: Self-pay | Admitting: Licensed Clinical Social Worker

## 2014-12-24 DIAGNOSIS — E1122 Type 2 diabetes mellitus with diabetic chronic kidney disease: Secondary | ICD-10-CM

## 2014-12-24 DIAGNOSIS — E1143 Type 2 diabetes mellitus with diabetic autonomic (poly)neuropathy: Secondary | ICD-10-CM

## 2014-12-24 DIAGNOSIS — I5042 Chronic combined systolic (congestive) and diastolic (congestive) heart failure: Secondary | ICD-10-CM

## 2014-12-24 DIAGNOSIS — N182 Chronic kidney disease, stage 2 (mild): Secondary | ICD-10-CM

## 2014-12-24 DIAGNOSIS — K3184 Gastroparesis: Secondary | ICD-10-CM

## 2014-12-24 LAB — BASIC METABOLIC PANEL WITH GFR
BUN: 10 mg/dL (ref 6–23)
CO2: 27 mEq/L (ref 19–32)
Calcium: 9.6 mg/dL (ref 8.4–10.5)
Chloride: 101 mEq/L (ref 96–112)
Creat: 0.69 mg/dL (ref 0.50–1.10)
GFR, Est African American: >89 mL/min
GFR, Est Non African American: >89 mL/min
Glucose, Bld: 71 mg/dL (ref 70–99)
Potassium: 5.1 mEq/L (ref 3.5–5.3)
Sodium: 141 mEq/L (ref 135–145)

## 2014-12-24 MED ORDER — LISINOPRIL 10 MG PO TABS: 20.0000 mg | ORAL_TABLET | Freq: Every day | ORAL | Status: DC

## 2014-12-24 NOTE — Telephone Encounter (Signed)
Ms. Vanderhoff was referred to CSW as pt would benefit from community care management.  Ms. Bolejack is dual eligible and [facility] would like to review referral to see if Ms. Mahn is one of their delegations.  If not, CSW will then make referral to [facility].  CSW placed call to Ms. Veloso, pt aware of community care management and states she had been contacted by [facility] in the past.  Pt agreeable to referral to [facility] or [facility] as needed.  CSW confirmed phone number.  Pt aware [facility] will make initial contact by telephone. CSW will mail Ms. Dye information regarding Medicaid transportation, as pt was in a social setting during this call.

## 2014-12-24 NOTE — Progress Notes (Signed)
Internal Medicine Clinic Attending  Case discussed with Dr. Qureshi soon after the resident saw the patient.  We reviewed the resident's history and exam and pertinent patient test results.  I agree with the assessment, diagnosis, and plan of care documented in the resident's note. 

## 2014-12-24 NOTE — Addendum Note (Signed)
Addended by: Wilber Oliphant on: 12/24/2014 02:30 PM   Modules accepted: Orders

## 2014-12-25 NOTE — Telephone Encounter (Signed)
Pt has been assigned to [place], [facility].

## 2014-12-31 ENCOUNTER — Telehealth: Payer: Self-pay | Admitting: Internal Medicine

## 2014-12-31 ENCOUNTER — Other Ambulatory Visit: Payer: Self-pay | Admitting: Internal Medicine

## 2014-12-31 MED ORDER — LISINOPRIL 20 MG PO TABS: 20.0000 mg | ORAL_TABLET | Freq: Every day | ORAL | Status: DC

## 2014-12-31 NOTE — Telephone Encounter (Signed)
Pt also needs a new rx for Lisinopril 20mg  daily.  Pt currently taking lisinopril 10mg  tabs two daily, but would like the 20 mg tabs. Will send to pcp for review and change.Despina Hidden Cassady3/30/201611:52 AM

## 2014-12-31 NOTE — Telephone Encounter (Signed)
Changed Lisinopril to 20mg  tablets and changed Humalog to pen as requested.   Please let patient know.   Thanks,  Dr q

## 2015-01-06 ENCOUNTER — Other Ambulatory Visit: Payer: Self-pay

## 2015-01-06 ENCOUNTER — Ambulatory Visit (HOSPITAL_COMMUNITY)
Admission: RE | Admit: 2015-01-06 | Discharge: 2015-01-06 | Disposition: A | Discharge status: Home / Self Care | Payer: Medicare Other | Source: Ambulatory Visit | Attending: Internal Medicine | Admitting: Internal Medicine

## 2015-01-06 ENCOUNTER — Other Ambulatory Visit: Payer: Self-pay | Admitting: Internal Medicine

## 2015-01-06 DIAGNOSIS — Z1231 Encounter for screening mammogram for malignant neoplasm of breast: Secondary | ICD-10-CM

## 2015-01-06 DIAGNOSIS — E118 Type 2 diabetes mellitus with unspecified complications: Secondary | ICD-10-CM

## 2015-01-06 DIAGNOSIS — Z1239 Encounter for other screening for malignant neoplasm of breast: Secondary | ICD-10-CM

## 2015-01-06 DIAGNOSIS — I5042 Chronic combined systolic (congestive) and diastolic (congestive) heart failure: Secondary | ICD-10-CM

## 2015-01-06 NOTE — Patient Outreach (Signed)
[place] [facility]) Care Management  01/06/2015  DEDREA DECOURCY Feb 07, 1951 [id number]  RN CM spoke with patient about the services of [facility].  Patient is agreeable to services and wishes to schedule an appointment with [facility] community nurse.  Patient has some knowledge deficits related to self-management of her diabetes.  Patient reports the highest blood sugar at 400 and the lowest at 69.  Patient needs help in developing a self action plan of what to do for high/low blood sugars.  Patient stated she does not monitor her blood pressure.  States the machine she has is not working and needs batteries replaced.  Patient states the doctor changed her blood pressure medication. According to notes in EPIC medication order has been sent to patient's pharmacy 12-31-14. The medication is for lisinopril and  increased to 20 mg daily.  Patient reports she has not picked up the medication from the pharmacy.  States she will have her son pick up the medication today.  RN CM explained to patient the importance of picking up medication and starting the new dose to better control her blood pressure.  Patient states she understands. RN CM will make a referral for [facility] community nurse to make a home assessment visit.  Maury Dus, RN, Ishmael Holter, [place] Telephonic Care Coordinator [phone]

## 2015-01-07 ENCOUNTER — Other Ambulatory Visit: Payer: Self-pay

## 2015-01-07 NOTE — Patient Outreach (Signed)
[place] [facility]) Care Management  01/07/2015  ANUHYA SURGEON July 11, 1951 [id number]  Call to schedule home visit. Home visit scheduled for tomorrow.  Thea Silversmith, RN, MSN, [place] Coordinator Cell: [phone]

## 2015-01-08 ENCOUNTER — Other Ambulatory Visit: Payer: Self-pay

## 2015-01-08 ENCOUNTER — Telehealth: Payer: Self-pay | Admitting: *Deleted

## 2015-01-08 NOTE — Telephone Encounter (Signed)
Pt called stating nurse with [facility] was out to see her and wanted to know the target goal for her A1C and Blood Pressure. Can you give a range?

## 2015-01-08 NOTE — Telephone Encounter (Signed)
Target goal of A1C is ideally <7 and BP goal <140/90  Thanks,  Dr. Barnetta Chapel

## 2015-01-08 NOTE — Telephone Encounter (Signed)
Pt informed

## 2015-01-09 NOTE — Patient Outreach (Signed)
[place] [facility]) Care Management   01/09/2015  [name] 07/22/1951 [id number]  [name] is an 64 y.o. female  Subjective: Member reports feeling all right, but has abdominal pain left side to back that started after starting metformin. Member reports the main issue she would like to work on is her blood pressure.  Objective:   Review of Systems  Respiratory:       Lungs clear bilaterally  Cardiovascular: Positive for leg swelling.       Lower legs/ankles/feet puffy, non pitting.  BP 110/70 mmHg  Pulse 92  Resp 22  Ht 1.524 m (5')  Wt 234 lb 6.4 oz (106.323 kg)  BMI 45.78 kg/m2  SpO2 94%  Physical Exam  Current Medications:   Current Outpatient Prescriptions  Medication Sig Dispense Refill  . albuterol (VENTOLIN HFA) 108 (90 BASE) MCG/ACT inhaler Inhale 2 puffs into the lungs every 6 (six) hours as needed. Shortness of breath 1 Inhaler 6  . Alcohol Swabs (ALCOHOL PREP) 70 % PADS     . amLODipine (NORVASC) 10 MG tablet Take 1 tablet (10 mg total) by mouth daily. 30 tablet 3  . aspirin EC 81 MG tablet Take 81 mg by mouth daily.    . Blood Glucose Calibration (OT ULTRA/FASTTK CNTRL SOLN) SOLN     . furosemide (LASIX) 20 MG tablet Take 1 tablet (20 mg total) by mouth daily. 30 tablet 3  . gabapentin (NEURONTIN) 300 MG capsule Take 1 capsule (300 mg total) by mouth at bedtime. 30 capsule 11  . glucose blood (ONE TOUCH ULTRA TEST) test strip Use as instructed by doctor up to 3x daily,dx code 250.02 insulin requiring 100 each 5  . insulin glargine (LANTUS) 100 UNIT/ML injection Inject 0.44 mLs (44 Units total) into the skin daily with breakfast. 20 mL 3  . insulin lispro (HUMALOG KWIKPEN) 100 UNIT/ML KiwkPen Inject 10 units with breakfast and 16 units with lunch and 10 units with dinner, hold if CBGs low or not eating and call MD 15 mL 3  . lisinopril (PRINIVIL,ZESTRIL) 20 MG tablet Take 1 tablet (20 mg total) by mouth daily. 30 tablet 1  . metFORMIN (GLUCOPHAGE)  500 MG tablet Take 1 tablet (500 mg total) by mouth daily with breakfast. 30 tablet 11  . metoprolol succinate (TOPROL-XL) 50 MG 24 hr tablet Take 1 tablet (50 mg total) by mouth daily. Take with or immediately following a meal. 30 tablet 3  . nitroGLYCERIN (NITROSTAT) 0.4 MG SL tablet Place 0.4 mg under the tongue every 5 (five) minutes as needed for chest pain.    [name] atorvastatin (LIPITOR) 80 MG tablet Take 1 tablet (80 mg total) by mouth at bedtime. (Patient not taking: Reported on 01/08/2015) 90 tablet 5  . metoCLOPramide (REGLAN) 5 MG tablet Take 1 tablet by mouth daily.  0  . ondansetron (ZOFRAN ODT) 8 MG disintegrating tablet Take 1 tablet (8 mg total) by mouth every 8 (eight) hours as needed for nausea or vomiting. (Patient not taking: Reported on 12/23/2014) 20 tablet 0   No current facility-administered medications for this visit.    Functional Status:   In your present state of health, do you have any difficulty performing the following activities: 01/08/2015 12/23/2014  Hearing? - N  Vision? - N  Difficulty concentrating or making decisions? - N  Walking or climbing stairs? - N  Dressing or bathing? - N  Doing errands, shopping? - N  Conservation officer, nature and eating ? N -  Using the Toilet? N -  In the past six months, have you accidently leaked urine? N -  Do you have problems with loss of bowel control? N -  Managing your Medications? N -  Managing your Finances? N -  Housekeeping or managing your Housekeeping? N -    Fall/Depression Screening:    PHQ 2/9 Scores 01/08/2015 12/23/2014 10/23/2014 09/02/2014 08/14/2014 07/21/2014 05/22/2014  PHQ - 2 Score 0 0 0 0 0 0 0    Assessment:  64 year old referral from primary care physician regarding medication compliance, blood pressure monitoring, diabetic education, education regarding heart failure self-management, nutrition. Member states her primary goal to work on first is blood pressure. Member reports she has been taking her medications as  prescribed. Member states she has a blood pressure cuff, it is in a location that she needs help getting to. Member states that she is going to help her to get to it. Member states her daughter is a Marine scientist and will help her in obtaining the blood pressure machine. Member is without any complaints of dizziness or lightheadedness. However member reports pain left side abdomen around to back, crampy like feeling that began after she started taking Metformin. Member called primary care office and reported this while care manager was in the home and is awaiting a follow up return call from primary care.  Out of target A1C (11.2)- 7 day average 256; 14 average 256; 30 average 199. Member to continue to check blood sugar and record. Medications recently changed per primary care, Member to also find out target A1C and acceptable blood sugar range.  Heart failure-member reports she has not had any recent exacerbations of heart failure. Care manager discussed the importance of daily weights. Member states she has a scale. Member encouraged to begin to weigh and record weights daily.  Plan: Home visit in two weeks to follow up on blood pressure management/education  [facility] CM Care Plan        Patient Outreach from 01/08/2015 in [place] Problem One  blood pressure management   Care Plan for Problem One  Active   Interventions for Problem One Long Term Goal  Discussed blood pressure goal range, medications reviewed,   [facility] Long Term Goal (31-90 days)  member will verbalize at least three strategies to maintain blood pressure control   [facility] Long Term Goal Start Date  01/08/15   [facility] CM Short Term Goal #1 (0-30 days)  member will take check blood pressure at least twice/week within the next two weeks.   [facility] CM Short Term Goal #1 Start Date  01/08/15   [facility] CM Short Term Goal #2 (0-30 days)  member will take medications as prescribed witin the next two weeks.   [facility] CM Short Term Goal #3  (0-30 days)  Member will find out target blood pressure range within the next 1-2 weeks.   [facility] CM Short Term Goal #3 Start Date  01/08/15

## 2015-01-12 ENCOUNTER — Telehealth: Payer: Self-pay | Admitting: *Deleted

## 2015-01-12 NOTE — Telephone Encounter (Signed)
Called pt per Dr Eula Fried - stated she has been having some back pack and has an appt Wed to discusss w/ the doctor.

## 2015-01-14 ENCOUNTER — Ambulatory Visit (INDEPENDENT_AMBULATORY_CARE_PROVIDER_SITE_OTHER): Payer: Medicare Other | Admitting: Internal Medicine

## 2015-01-14 ENCOUNTER — Encounter: Payer: Self-pay | Admitting: Internal Medicine

## 2015-01-14 VITALS — BP 138/84 | HR 88 | Temp 98.1°F | Ht 60.0 in | Wt 232.6 lb

## 2015-01-14 DIAGNOSIS — I10 Essential (primary) hypertension: Secondary | ICD-10-CM

## 2015-01-14 DIAGNOSIS — E1122 Type 2 diabetes mellitus with diabetic chronic kidney disease: Secondary | ICD-10-CM

## 2015-01-14 DIAGNOSIS — E785 Hyperlipidemia, unspecified: Secondary | ICD-10-CM

## 2015-01-14 DIAGNOSIS — N182 Chronic kidney disease, stage 2 (mild): Secondary | ICD-10-CM | POA: Diagnosis not present

## 2015-01-14 DIAGNOSIS — E1169 Type 2 diabetes mellitus with other specified complication: Secondary | ICD-10-CM

## 2015-01-14 LAB — BASIC METABOLIC PANEL WITH GFR
BUN: 12 mg/dL (ref 6–23)
CO2: 26 mEq/L (ref 19–32)
Calcium: 9 mg/dL (ref 8.4–10.5)
Chloride: 102 mEq/L (ref 96–112)
Creat: 0.72 mg/dL (ref 0.50–1.10)
GFR, Est African American: >89 mL/min
GFR, Est Non African American: >89 mL/min
Glucose, Bld: 145 mg/dL — ABNORMAL HIGH (ref 70–99)
Potassium: 4 mEq/L (ref 3.5–5.3)
Sodium: 142 mEq/L (ref 135–145)

## 2015-01-14 LAB — GLUCOSE, CAPILLARY: Glucose-Capillary: 163 mg/dL — ABNORMAL HIGH (ref 70–99)

## 2015-01-14 LAB — POCT GLYCOSYLATED HEMOGLOBIN (HGB A1C): Hemoglobin A1C: 9.6

## 2015-01-14 MED ORDER — METFORMIN HCL ER 500 MG PO TB24: 500.0000 mg | ORAL_TABLET | Freq: Every day | ORAL | Status: DC

## 2015-01-14 MED ORDER — ATORVASTATIN CALCIUM 80 MG PO TABS: 80.0000 mg | ORAL_TABLET | Freq: Every day | ORAL | Status: DC

## 2015-01-14 NOTE — Assessment & Plan Note (Signed)
-  Still not taking Atorvastatin, she should have refill at the pharmacy but she says she does not, will resent a Rx.

## 2015-01-14 NOTE — Patient Instructions (Addendum)
General Instructions: Start taking the extended release metformin insead of regular.  You can take tylenol up to 4000 mg a day for pain.  Thank you for bringing your medicines today. This helps [place] keep you safe from mistakes.   Progress Toward Treatment Goals:  Treatment Goal 01/14/2015  Hemoglobin A1C -  Blood pressure at goal  Stop smoking smoking the same amount  Prevent falls -    Self Care Goals & Plans:  Self Care Goal 12/23/2014  Manage my medications take my medicines as prescribed; bring my medications to every visit; refill my medications on time; follow the sick day instructions if I am sick  Monitor my health keep track of my blood glucose; bring my glucose meter and log to each visit; keep track of my blood pressure; keep track of my weight; check my feet daily  Eat healthy foods eat more vegetables; eat fruit for snacks and desserts; eat baked foods instead of fried foods; eat foods that are low in salt; drink diet soda or water instead of juice or soda; eat smaller portions  Be physically active find an activity I enjoy  Stop smoking call [facility] (1-800-QUIT-NOW)    Home Blood Glucose Monitoring 01/14/2015  Check my blood sugar 3 times a day  When to check my blood sugar before meals     Care Management & Community Referrals:  Referral 01/30/2013  Referrals made for care management support diabetes educator; nutritionist  Referrals made to community resources smoking cessation; nutrition

## 2015-01-14 NOTE — Progress Notes (Signed)
Medicine attending: Medical history, presenting problems, physical findings, and medications, reviewed with Dr Erik Hoffman and I concur with his evaluation and management plan. 

## 2015-01-14 NOTE — Assessment & Plan Note (Addendum)
BP Readings from Last 3 Encounters:  01/14/15 138/84  01/08/15 110/70  12/23/14 183/106   Lab Results  Component Value Date   NA 141 12/23/2014   K 5.1 12/23/2014   CREATININE 0.69 12/23/2014   Assessment: Blood pressure control: controlled Progress toward BP goal:  at goal Comments: has [facility] coming out and is taking her medications  Plan: Medications:   lisinopril 20mg , norvasc 10mg , lasix 20mg  and toprol xl 50mg  daily Educational resources provided:   Self management tools provided:   Other plans: repeat BMP today for K+ recheck on ACEi

## 2015-01-15 NOTE — Assessment & Plan Note (Addendum)
Lab Results  Component Value Date   HGBA1C 9.6 01/14/2015   HGBA1C 11.2 10/23/2014   HGBA1C 12.7* 07/12/2014     Assessment: Diabetes control: poor control (HgbA1C >9%) Progress toward A1C goal:  improved Comments: meter reivew shows many in range readings  Plan: Medications:  Change metfromin to XR continue 500mg  daily, Continue lantus 44 units and humalog 07/18/09 Home glucose monitoring: Frequency: 3 times a day Timing: before meals Instruction/counseling given: reminded to bring blood glucose meter & log to each visit, reminded to bring medications to each visit and discussed diet Educational resources provided:   Self management tools provided:   Other plans: I suspect her abdominal pain is due to the metformin.  However she has lost 2 lbs and sugars are better controlled with low dose of metformin I hope that by changing to extended release she will be able to continue taking metformin.

## 2015-01-15 NOTE — Progress Notes (Signed)
[facility] Subjective:   Patient ID: [name] female   DOB: 1951/03/28 64 y.o.   MRN: [id number]  HPI: [name] is a 64 y.o. female with a PMH detailed below who presents for 2 week follow up of diabetes.  She reports that since starting metformin she had a few days of diarrhea and some abdominal pain.  She reports that the diarrhea has resolved but that she seems to still be having some abdominal pain.  She denies any nausea or vomiting.  She reports that she is pleased with her recent blood sugar control and that she has lost 2 lbs.  She denies any sick contacts.     Past Medical History  Diagnosis Date  . Hyperthyroidism, subclinical   . Post menopausal syndrome   . Obesity   . Cardiac defibrillator in situ     Atlas II VR (SJM) implanted by Dr Lovena Le  . Eczema   . Lipoma   . Chronic ulcer of leg   . Hyperlipidemia   . Chronic combined systolic and diastolic heart failure     a. EF 35-40% in past;  b. Echo 7/13:  EF 45-50%, Gr 2 diast dysfn, mild AI, mild MAC, trivial MR, mild LAE, PASP 47.  [name] NICM (nonischemic cardiomyopathy)   . Diabetes mellitus   . HTN (hypertension)   . Elevated alkaline phosphatase level     GGT and 5'nucleotidase 8/13 normal  . Hx of cardiac cath     a. [place] 2003 normal;  b. LHC 6/13:  Mild calcification in the LM, o/w normal coronary arteries, EF 45%.   . Sleep apnea     pt denies 04/12/2013  . COPD (chronic obstructive pulmonary disease)     O2 at night  . Automatic implantable cardioverter-defibrillator in situ   . CHF (congestive heart failure)   . Depression   . Implantable cardioverter-defibrillator (ICD) generator end of life   . ATN (acute tubular necrosis) 07/15/2014   Current Outpatient Prescriptions  Medication Sig Dispense Refill  . albuterol (VENTOLIN HFA) 108 (90 BASE) MCG/ACT inhaler Inhale 2 puffs into the lungs every 6 (six) hours as needed. Shortness of breath 1 Inhaler 6  . amLODipine  (NORVASC) 10 MG tablet Take 1 tablet (10 mg total) by mouth daily. 30 tablet 3  . aspirin EC 81 MG tablet Take 81 mg by mouth daily.    . Blood Glucose Calibration (OT ULTRA/FASTTK CNTRL SOLN) SOLN     . furosemide (LASIX) 20 MG tablet Take 1 tablet (20 mg total) by mouth daily. 30 tablet 3  . gabapentin (NEURONTIN) 300 MG capsule Take 1 capsule (300 mg total) by mouth at bedtime. 30 capsule 11  . glucose blood (ONE TOUCH ULTRA TEST) test strip Use as instructed by doctor up to 3x daily,dx code 250.02 insulin requiring 100 each 5  . insulin glargine (LANTUS) 100 UNIT/ML injection Inject 0.44 mLs (44 Units total) into the skin daily with breakfast. 20 mL 3  . insulin lispro (HUMALOG KWIKPEN) 100 UNIT/ML KiwkPen Inject 10 units with breakfast and 16 units with lunch and 10 units with dinner, hold if CBGs low or not eating and call MD 15 mL 3  . lisinopril (PRINIVIL,ZESTRIL) 20 MG tablet Take 1 tablet (20 mg total) by mouth daily. 30 tablet 1  . metoCLOPramide (REGLAN) 5 MG tablet Take 1 tablet by mouth daily.  0  . metoprolol succinate (TOPROL-XL) 50 MG 24 hr tablet Take 1 tablet (  50 mg total) by mouth daily. Take with or immediately following a meal. 30 tablet 3  . Alcohol Swabs (ALCOHOL PREP) 70 % PADS     . atorvastatin (LIPITOR) 80 MG tablet Take 1 tablet (80 mg total) by mouth at bedtime. (Patient not taking: Reported on 01/14/2015) 90 tablet 3  . metFORMIN (GLUCOPHAGE XR) 500 MG 24 hr tablet Take 1 tablet (500 mg total) by mouth daily with breakfast. (Patient not taking: Reported on 01/14/2015) 90 tablet 3  . nitroGLYCERIN (NITROSTAT) 0.4 MG SL tablet Place 0.4 mg under the tongue every 5 (five) minutes as needed for chest pain.    [name] ondansetron (ZOFRAN ODT) 8 MG disintegrating tablet Take 1 tablet (8 mg total) by mouth every 8 (eight) hours as needed for nausea or vomiting. (Patient not taking: Reported on 12/23/2014) 20 tablet 0   No current facility-administered medications for this visit.    Family History  Problem Relation Age of Onset  . Stroke Mother   . Seizures Father   . Diabetes Sister   . Asthma Maternal Aunt     aunts  . Asthma Maternal Uncle     uncles  . Heart disease Father   . Heart disease Paternal Aunt     aunts  . Heart disease Paternal Uncle     uncles  . Heart disease Maternal Aunt     aunts  . Heart disease Maternal Uncle     uncles  . Heart disease Maternal Grandfather    History   Social History  . Marital Status: Widowed    Spouse Name: Alroy Dust  . Number of Children: N/A  . Years of Education: 11   Occupational History  . disabled    Social History Main Topics  . Smoking status: Light Tobacco Smoker -- 0.25 packs/day for 35 years    Types: Cigarettes  . Smokeless tobacco: Not on file     Comment: currently smoking 3-4 cigs daily or less.  . Alcohol Use: No  . Drug Use: No  . Sexual Activity: Not on file   Other Topics Concern  . None   Social History Narrative   ** Merged History Encounter **       Married   Review of Systems: Review of Systems  Constitutional: Negative for fever, chills and malaise/fatigue.  Eyes: Negative for blurred vision.  Respiratory: Negative for cough and shortness of breath.   Cardiovascular: Negative for chest pain and leg swelling.  Gastrointestinal: Positive for abdominal pain. Negative for heartburn, nausea, vomiting and blood in stool.  Genitourinary: Negative for dysuria.  Musculoskeletal: Negative for myalgias.  Neurological: Negative for dizziness and headaches.  Endo/Heme/Allergies: Negative for polydipsia.    Objective:  Physical Exam: Filed Vitals:   01/14/15 1026 01/14/15 1032  BP: 138/84   Pulse: 88   Temp: 98.1 F (36.7 C) 98.1 F (36.7 C)  TempSrc: Oral Oral  Height: 5' (1.524 m)   Weight: 232 lb 9.6 oz (105.507 kg)   SpO2: 97%   Physical Exam  Constitutional: She is well-developed, well-nourished, and in no distress. No distress.  HENT:  Head: Normocephalic and  atraumatic.  Eyes: Conjunctivae are normal.  Cardiovascular: Normal rate and regular rhythm.   Pulmonary/Chest: Effort normal and breath sounds normal. She has no wheezes. She has no rales.  Abdominal: Soft. Bowel sounds are normal. She exhibits no distension. There is tenderness (mild left upper quadrant).  Musculoskeletal: She exhibits no edema.  Skin: Skin is warm and dry. She  is not diaphoretic.  Psychiatric: Affect and judgment normal.  Nursing note and vitals reviewed.   Assessment & Plan:  Case discussed with Dr. Beryle Beams  HYPERTENSION, BENIGN ESSENTIAL BP Readings from Last 3 Encounters:  01/14/15 138/84  01/08/15 110/70  12/23/14 183/106   Lab Results  Component Value Date   NA 141 12/23/2014   K 5.1 12/23/2014   CREATININE 0.69 12/23/2014   Assessment: Blood pressure control: controlled Progress toward BP goal:  at goal Comments: has [facility] coming out and is taking her medications  Plan: Medications:   lisinopril 20mg , norvasc 10mg , lasix 20mg  and toprol xl 50mg  daily Educational resources provided:   Self management tools provided:   Other plans: repeat BMP today for K+ recheck on ACEi     Hyperlipidemia associated with type 2 diabetes mellitus -Still not taking Atorvastatin, she should have refill at the pharmacy but she says she does not, will resent a Rx.   Type 2 diabetes mellitus with stage 2 chronic kidney disease Lab Results  Component Value Date   HGBA1C 9.6 01/14/2015   HGBA1C 11.2 10/23/2014   HGBA1C 12.7* 07/12/2014     Assessment: Diabetes control: poor control (HgbA1C >9%) Progress toward A1C goal:  improved Comments: meter reivew shows many in range readings  Plan: Medications:  Change metfromin to XR continue 500mg  daily, Continue lantus 44 units and humalog 07/18/09 Home glucose monitoring: Frequency: 3 times a day Timing: before meals Instruction/counseling given: reminded to bring blood glucose meter & log to each visit,  reminded to bring medications to each visit and discussed diet Educational resources provided:   Self management tools provided:   Other plans: I suspect her abdominal pain is due to the metformin.  However she has lost 2 lbs and sugars are better controlled with low dose of metformin I hope that by changing to extended release she will be able to continue taking metformin.       Medications Ordered Meds ordered this encounter  Medications  . metFORMIN (GLUCOPHAGE XR) 500 MG 24 hr tablet    Sig: Take 1 tablet (500 mg total) by mouth daily with breakfast.    Dispense:  90 tablet    Refill:  3    Please discontinue Metformin instant release  . atorvastatin (LIPITOR) 80 MG tablet    Sig: Take 1 tablet (80 mg total) by mouth at bedtime.    Dispense:  90 tablet    Refill:  3   Other Orders Orders Placed This Encounter  Procedures  . Glucose, capillary  . BMP with Estimated GFR (CPT-80048)  . POC Hbg A1C

## 2015-01-22 ENCOUNTER — Ambulatory Visit: Payer: Medicare Other

## 2015-01-23 ENCOUNTER — Other Ambulatory Visit: Payer: Self-pay

## 2015-01-23 NOTE — Patient Outreach (Signed)
[facility]) Care Management  01/23/2015  [name] Feb 07, 1951 [id number]  Subjective: "I've been doing pretty good", member reports she has some swelling in ankles/feet(not new) that has improved. Member states she last saw primary care provider regarding left side discomfort on 4/14 with a noted weight loss of two pounds.  Objective: BP 130/80 mmHg  Pulse 84  Resp 24  SpO2 95%, lungs clear, respirations unlabored, even, skin warm dry, pulse regular, 2+ edema to lower legs/ankle feet(non pitting). Member reports edema improved.  Assessment: 64 year old with history of high blood pressure, diabetes and heart failure. Last visit and this visit focused on hypertension. Member has medications and is taking medications as prescribed. Member has followed up with primary care. Has obtained a blood pressure cuff this week and is taking blood pressures at home. Care Manager provided [facility] calender organizer and instructed on how to use to record readings. Member was able to talk back Target blood pressure goal 140/90, as instructed per primary care.  History of diabetes. 7 day average 149 n=11 (down from 256); 14 day average 154 n=21 (down from 211; 30 day average 181 n=40 (down from 199). Member reports one episode of hypoglycemia 60 that was treated with food). Care manager discussed handout. "low blood sugar-self care".  History of heart failure. Care manager reviewed heart failure zone tool provided in [employer] calender/organizer. Member currently does not have a scale, but states she will purchase a scale and begin weighing self daily by the next home visit.   Plan: home visit in 3-4 weeks.  [facility] CM Care Plan Problem One        Patient Outreach from 01/23/2015 in [place]   Patient Outreach from 01/08/2015 in [place] Problem One  blood pressure management  blood pressure management   Care Plan for Problem One  Active  Active   [facility] Long Term  Goal Start Date  01/08/15  01/08/15   [facility] Long Term Goal Met Date  01/23/15 [stated-diet,medications,monitoring,excersice,stop smoking]     Interventions for Problem One Long Term Goal  Reviewed blood pressure goal, medications reviewed, showed EMMI video-Hypertension,  Discussed smoking cessation, referral to pharmacist regarding smoking cessation.  Discussed blood pressure goal range, medications reviewed,   [facility]N CM Short Term Goal #1 (0-30 days)  member will take check blood pressure at least twice/week within the next two weeks.  member will take check blood pressure at least twice/week within the next two weeks.   [facility]N CM Short Term Goal #1 Start Date  01/08/15  01/08/15   [facility]N CM Short Term Goal #1 Met Date  01/23/15 [received cuff on Wednesday and has checked twice]     Interventions for Short Term Goal #1  reviewed target goal [member called primary care and obtain goal of 140/90]  Discussed blood pressure goals, encouraged member to find out acceptable blood pressure range   [facility]N CM Short Term Goal #2 (0-30 days)  member will take medications as prescribed witin the next two weeks.  member will take medications as prescribed witin the next two weeks.   THN CM Short Term Goal #2 Start Date  01/08/15     [facility] CM Short Term Goal #2 Met Date  01/23/15     Interventions for Short Term Goal #2  medications reviewed.  medications reviewed.   [facility]N CM Short Term Goal #3 (0-30 days)  Member will find out target blood pressure range within the  next 1-2 weeks.  Member will find out target blood pressure range within the next 1-2 weeks.   THN CM Short Term Goal #3 Start Date  01/08/15  01/08/15   [facility]N CM Short Term Goal #3 Met Date  01/23/15     Interventions for Short Tern Goal #3  Reviewed blood pressure readings, encouraged continued recording of blood pressure readings, [facility]N organizer/calender provided.  Discussed importance of knowing blood pressure goal/range

## 2015-01-26 ENCOUNTER — Encounter (HOSPITAL_COMMUNITY): Payer: Self-pay | Admitting: Emergency Medicine

## 2015-01-26 DIAGNOSIS — R932 Abnormal findings on diagnostic imaging of liver and biliary tract: Secondary | ICD-10-CM | POA: Diagnosis not present

## 2015-01-26 DIAGNOSIS — Z9981 Dependence on supplemental oxygen: Secondary | ICD-10-CM | POA: Insufficient documentation

## 2015-01-26 DIAGNOSIS — Z794 Long term (current) use of insulin: Secondary | ICD-10-CM | POA: Insufficient documentation

## 2015-01-26 DIAGNOSIS — K753 Granulomatous hepatitis, not elsewhere classified: Secondary | ICD-10-CM | POA: Diagnosis not present

## 2015-01-26 DIAGNOSIS — Z90710 Acquired absence of both cervix and uterus: Secondary | ICD-10-CM | POA: Diagnosis not present

## 2015-01-26 DIAGNOSIS — Z780 Asymptomatic menopausal state: Secondary | ICD-10-CM | POA: Insufficient documentation

## 2015-01-26 DIAGNOSIS — Z9889 Other specified postprocedural states: Secondary | ICD-10-CM | POA: Insufficient documentation

## 2015-01-26 DIAGNOSIS — K868 Other specified diseases of pancreas: Secondary | ICD-10-CM | POA: Diagnosis not present

## 2015-01-26 DIAGNOSIS — E669 Obesity, unspecified: Secondary | ICD-10-CM | POA: Diagnosis not present

## 2015-01-26 DIAGNOSIS — I10 Essential (primary) hypertension: Secondary | ICD-10-CM | POA: Diagnosis not present

## 2015-01-26 DIAGNOSIS — G4730 Sleep apnea, unspecified: Secondary | ICD-10-CM | POA: Insufficient documentation

## 2015-01-26 DIAGNOSIS — R1012 Left upper quadrant pain: Secondary | ICD-10-CM | POA: Diagnosis not present

## 2015-01-26 DIAGNOSIS — Z7982 Long term (current) use of aspirin: Secondary | ICD-10-CM | POA: Insufficient documentation

## 2015-01-26 DIAGNOSIS — Z95810 Presence of automatic (implantable) cardiac defibrillator: Secondary | ICD-10-CM | POA: Insufficient documentation

## 2015-01-26 DIAGNOSIS — R109 Unspecified abdominal pain: Secondary | ICD-10-CM | POA: Diagnosis not present

## 2015-01-26 DIAGNOSIS — Z8659 Personal history of other mental and behavioral disorders: Secondary | ICD-10-CM | POA: Diagnosis not present

## 2015-01-26 DIAGNOSIS — E119 Type 2 diabetes mellitus without complications: Secondary | ICD-10-CM | POA: Diagnosis not present

## 2015-01-26 DIAGNOSIS — J449 Chronic obstructive pulmonary disease, unspecified: Secondary | ICD-10-CM | POA: Insufficient documentation

## 2015-01-26 DIAGNOSIS — K2970 Gastritis, unspecified, without bleeding: Secondary | ICD-10-CM | POA: Diagnosis not present

## 2015-01-26 DIAGNOSIS — R10812 Left upper quadrant abdominal tenderness: Secondary | ICD-10-CM | POA: Diagnosis not present

## 2015-01-26 DIAGNOSIS — Z872 Personal history of diseases of the skin and subcutaneous tissue: Secondary | ICD-10-CM | POA: Insufficient documentation

## 2015-01-26 DIAGNOSIS — R110 Nausea: Secondary | ICD-10-CM | POA: Insufficient documentation

## 2015-01-26 DIAGNOSIS — Z9851 Tubal ligation status: Secondary | ICD-10-CM | POA: Insufficient documentation

## 2015-01-26 DIAGNOSIS — Z8742 Personal history of other diseases of the female genital tract: Secondary | ICD-10-CM | POA: Diagnosis not present

## 2015-01-26 DIAGNOSIS — Z79899 Other long term (current) drug therapy: Secondary | ICD-10-CM | POA: Diagnosis not present

## 2015-01-26 DIAGNOSIS — E785 Hyperlipidemia, unspecified: Secondary | ICD-10-CM | POA: Insufficient documentation

## 2015-01-26 DIAGNOSIS — I5042 Chronic combined systolic (congestive) and diastolic (congestive) heart failure: Secondary | ICD-10-CM | POA: Diagnosis not present

## 2015-01-26 DIAGNOSIS — Z720 Tobacco use: Secondary | ICD-10-CM | POA: Diagnosis not present

## 2015-01-26 LAB — COMPREHENSIVE METABOLIC PANEL
ALT: 27 U/L (ref 0–35)
AST: 35 U/L (ref 0–37)
Albumin: 3.5 g/dL (ref 3.5–5.2)
Alkaline Phosphatase: 112 U/L (ref 39–117)
Anion gap: 9 (ref 5–15)
BUN: 8 mg/dL (ref 6–23)
CO2: 30 mmol/L (ref 19–32)
Calcium: 9.1 mg/dL (ref 8.4–10.5)
Chloride: 103 mmol/L (ref 96–112)
Creatinine, Ser: 0.86 mg/dL (ref 0.50–1.10)
GFR calc Af Amer: 83 mL/min — ABNORMAL LOW (ref >90)
GFR calc non Af Amer: 71 mL/min — ABNORMAL LOW (ref >90)
Glucose, Bld: 99 mg/dL (ref 70–99)
Potassium: 3.6 mmol/L (ref 3.5–5.1)
Sodium: 142 mmol/L (ref 135–145)
Total Bilirubin: 0.5 mg/dL (ref 0.3–1.2)
Total Protein: 6.5 g/dL (ref 6.0–8.3)

## 2015-01-26 LAB — URINALYSIS, ROUTINE W REFLEX MICROSCOPIC
Bilirubin Urine: NEGATIVE
Glucose, UA: NEGATIVE mg/dL
Hgb urine dipstick: NEGATIVE
Ketones, ur: NEGATIVE mg/dL
Nitrite: NEGATIVE
Protein, ur: 30 mg/dL — AB
Specific Gravity, Urine: 1.02 (ref 1.005–1.030)
Urobilinogen, UA: 1 mg/dL (ref 0.0–1.0)
pH: 6 (ref 5.0–8.0)

## 2015-01-26 LAB — CBC WITH DIFFERENTIAL/PLATELET
Basophils Absolute: 0 10*3/uL (ref 0.0–0.1)
Basophils Relative: 0 % (ref 0–1)
Eosinophils Absolute: 0.3 10*3/uL (ref 0.0–0.7)
Eosinophils Relative: 3 % (ref 0–5)
HCT: 40.3 % (ref 36.0–46.0)
Hemoglobin: 13 g/dL (ref 12.0–15.0)
Lymphocytes Relative: 27 % (ref 12–46)
Lymphs Abs: 2.6 10*3/uL (ref 0.7–4.0)
MCH: 27.7 pg (ref 26.0–34.0)
MCHC: 32.3 g/dL (ref 30.0–36.0)
MCV: 85.7 fL (ref 78.0–100.0)
Monocytes Absolute: 0.7 10*3/uL (ref 0.1–1.0)
Monocytes Relative: 8 % (ref 3–12)
Neutro Abs: 5.8 10*3/uL (ref 1.7–7.7)
Neutrophils Relative %: 62 % (ref 43–77)
Platelets: 266 10*3/uL (ref 150–400)
RBC: 4.7 MIL/uL (ref 3.87–5.11)
RDW: 15.3 % (ref 11.5–15.5)
WBC: 9.4 10*3/uL (ref 4.0–10.5)

## 2015-01-26 LAB — URINE MICROSCOPIC-ADD ON

## 2015-01-26 LAB — LIPASE, BLOOD: Lipase: 58 U/L (ref 11–59)

## 2015-01-26 NOTE — ED Notes (Signed)
Pt reports LUQ pain with n/v; pt reports pain is intermittent and only lasts a "few seconds and then is gone"; pt reports one episode of vomiting today; pt denies pain or nausea at this time

## 2015-01-27 ENCOUNTER — Emergency Department (HOSPITAL_COMMUNITY): Payer: Medicare Other

## 2015-01-27 ENCOUNTER — Emergency Department (HOSPITAL_COMMUNITY)
Admission: EM | Admit: 2015-01-27 | Discharge: 2015-01-27 | Disposition: A | Discharge status: Home / Self Care | Payer: Medicare Other | Attending: Emergency Medicine | Admitting: Emergency Medicine

## 2015-01-27 ENCOUNTER — Encounter (HOSPITAL_COMMUNITY): Payer: Self-pay

## 2015-01-27 DIAGNOSIS — R932 Abnormal findings on diagnostic imaging of liver and biliary tract: Secondary | ICD-10-CM

## 2015-01-27 DIAGNOSIS — K753 Granulomatous hepatitis, not elsewhere classified: Secondary | ICD-10-CM | POA: Diagnosis not present

## 2015-01-27 DIAGNOSIS — K868 Other specified diseases of pancreas: Secondary | ICD-10-CM | POA: Diagnosis not present

## 2015-01-27 DIAGNOSIS — R109 Unspecified abdominal pain: Secondary | ICD-10-CM

## 2015-01-27 MED ORDER — ACETAMINOPHEN 325 MG PO TABS
650.0000 mg | ORAL_TABLET | Freq: Once | ORAL | Status: AC
  Administered 2015-01-27: 650 mg via ORAL
  Filled 2015-01-27: qty 2

## 2015-01-27 MED ORDER — ONDANSETRON 4 MG PO TBDP
4.0000 mg | ORAL_TABLET | Freq: Once | ORAL | Status: AC
  Administered 2015-01-27: 4 mg via ORAL
  Filled 2015-01-27: qty 1

## 2015-01-27 MED ORDER — HYDROCODONE-ACETAMINOPHEN 5-325 MG PO TABS: 1.0000 | ORAL_TABLET | ORAL | Status: DC | PRN

## 2015-01-27 NOTE — Discharge Instructions (Signed)
You will need additional testing to take a closer look at your pancreas.  Your pancreas was abnormal on the CT scan today.  Please speak with your doctor about the possibility of additional imaging which may include an MRCP to further evaluate the abnormal appearance of your pancreas  Abdominal Pain Many things can cause abdominal pain. Usually, abdominal pain is not caused by a disease and will improve without treatment. It can often be observed and treated at home. Your health care provider will do a physical exam and possibly order blood tests and X-rays to help determine the seriousness of your pain. However, in many cases, more time must pass before a clear cause of the pain can be found. Before that point, your health care provider may not know if you need more testing or further treatment. HOME CARE INSTRUCTIONS  Monitor your abdominal pain for any changes. The following actions may help to alleviate any discomfort you are experiencing:  Only take over-the-counter or prescription medicines as directed by your health care provider.  Do not take laxatives unless directed to do so by your health care provider.  Try a clear liquid diet (broth, tea, or water) as directed by your health care provider. Slowly move to a bland diet as tolerated. SEEK MEDICAL CARE IF:  You have unexplained abdominal pain.  You have abdominal pain associated with nausea or diarrhea.  You have pain when you urinate or have a bowel movement.  You experience abdominal pain that wakes you in the night.  You have abdominal pain that is worsened or improved by eating food.  You have abdominal pain that is worsened with eating fatty foods.  You have a fever. SEEK IMMEDIATE MEDICAL CARE IF:   Your pain does not go away within 2 hours.  You keep throwing up (vomiting).  Your pain is felt only in portions of the abdomen, such as the right side or the left lower portion of the abdomen.  You pass bloody or black  tarry stools. MAKE SURE YOU:  Understand these instructions.   Will watch your condition.   Will get help right away if you are not doing well or get worse.  Document Released: 06/29/2005 Document Revised: 09/24/2013 Document Reviewed: 05/29/2013 [facility] Patient Information 2015 [place], [place]. This information is not intended to replace advice given to you by your health care provider. Make sure you discuss any questions you have with your health care provider.

## 2015-01-27 NOTE — ED Provider Notes (Signed)
CSN: [id number]     Arrival date & time 01/26/15  2013 History  This chart was scribed for Jola Schmidt, MD by Rayfield Citizen, ED Scribe. This patient was seen in room B19C/B19C and the patient's care was started at 2:38 AM.    Chief Complaint  Patient presents with  . Abdominal Pain  . Nausea  . Emesis   Patient is a 64 y.o. female presenting with abdominal pain and vomiting. The history is provided by the patient. No language interpreter was used.  Abdominal Pain Associated symptoms: vomiting   Emesis Associated symptoms: abdominal pain     HPI Comments: [name] is a 64 y.o. female who presents to the Emergency Department complaining of intermittent, waxing and waning left-sided abdominal pain radiating up to her left shoulder and left scapular region. Her pain is exacerbated with applied pressure. She reports associated nausea, as well as mild cough. She denies prior experience with similar symptoms. She denies SOB, fevers, rash, right-sided abdominal pain.. She denies urinary symptoms, personal history of kidney stones.   Past Medical History  Diagnosis Date  . Hyperthyroidism, subclinical   . Post menopausal syndrome   . Obesity   . Cardiac defibrillator in situ     Atlas II VR (SJM) implanted by Dr Lovena Le  . Eczema   . Lipoma   . Chronic ulcer of leg   . Hyperlipidemia   . Chronic combined systolic and diastolic heart failure     a. EF 35-40% in past;  b. Echo 7/13:  EF 45-50%, Gr 2 diast dysfn, mild AI, mild MAC, trivial MR, mild LAE, PASP 47.  [name] NICM (nonischemic cardiomyopathy)   . Diabetes mellitus   . HTN (hypertension)   . Elevated alkaline phosphatase level     GGT and 5'nucleotidase 8/13 normal  . Hx of cardiac cath     a. [place] 2003 normal;  b. LHC 6/13:  Mild calcification in the LM, o/w normal coronary arteries, EF 45%.   . Sleep apnea     pt denies 04/12/2013  . COPD (chronic obstructive pulmonary disease)     O2 at night  . Automatic implantable  cardioverter-defibrillator in situ   . CHF (congestive heart failure)   . Depression   . Implantable cardioverter-defibrillator (ICD) generator end of life   . ATN (acute tubular necrosis) 07/15/2014   Past Surgical History  Procedure Laterality Date  . Cardiac defibrillator placement  05/04/2007    SJM Atlas II VR ICD  . Hysteroscopy    . Cardiac defibrillator placement    . Abdominal hysterectomy    . Cardiac catheterization    . Insert / replace / remove pacemaker    . Tubal ligation    . Hernia repair    . Colonoscopy N/A 04/12/2013    Procedure: COLONOSCOPY;  Surgeon: Beryle Beams, MD;  Location: [facility] ENDOSCOPY;  Service: Endoscopy;  Laterality: N/A;  pt.has defibrilator  . Left heart catheterization with coronary angiogram N/A 04/02/2012    Procedure: LEFT HEART CATHETERIZATION WITH CORONARY ANGIOGRAM;  Surgeon: Hillary Bow, MD;  Location: [facility] CATH LAB;  Service: Cardiovascular;  Laterality: N/A;  . Implantable cardioverter defibrillator (icd) generator change N/A 04/02/2014    Procedure: ICD GENERATOR CHANGE;  Surgeon: Evans Lance, MD;  Location: [facility] CATH LAB;  Service: Cardiovascular;  Laterality: N/A;   Family History  Problem Relation Age of Onset  . Stroke Mother   . Seizures Father   . Diabetes Sister   .  Asthma Maternal Aunt     aunts  . Asthma Maternal Uncle     uncles  . Heart disease Father   . Heart disease Paternal Aunt     aunts  . Heart disease Paternal Uncle     uncles  . Heart disease Maternal Aunt     aunts  . Heart disease Maternal Uncle     uncles  . Heart disease Maternal Grandfather    History  Substance Use Topics  . Smoking status: Light Tobacco Smoker -- 0.25 packs/day for 35 years    Types: Cigarettes  . Smokeless tobacco: Not on file     Comment: currently smoking 3-4 cigs daily or less.  . Alcohol Use: No   OB History    No data available     Review of Systems  Gastrointestinal: Positive for vomiting and abdominal pain.     A complete 10 system review of systems was obtained and all systems are negative except as noted in the HPI and PMH.    Allergies  Avandia; Hydrocortisone; and Pioglitazone  Home Medications   Prior to Admission medications   Medication Sig Start Date End Date Taking? Authorizing Provider  albuterol (VENTOLIN HFA) 108 (90 BASE) MCG/ACT inhaler Inhale 2 puffs into the lungs every 6 (six) hours as needed. Shortness of breath 07/09/13  Yes Wilber Oliphant, MD  amLODipine (NORVASC) 10 MG tablet Take 1 tablet (10 mg total) by mouth daily. 08/08/14  Yes Alexa Sherral Hammers, MD  aspirin EC 81 MG tablet Take 81 mg by mouth daily.   Yes Historical Provider, MD  atorvastatin (LIPITOR) 80 MG tablet Take 1 tablet (80 mg total) by mouth at bedtime. 01/14/15  Yes Lucious Groves, DO  furosemide (LASIX) 20 MG tablet Take 1 tablet (20 mg total) by mouth daily. 08/08/14  Yes Alexa Sherral Hammers, MD  gabapentin (NEURONTIN) 300 MG capsule Take 1 capsule (300 mg total) by mouth at bedtime. 07/04/14  Yes Nischal Dareen Piano, MD  insulin glargine (LANTUS) 100 UNIT/ML injection Inject 0.44 mLs (44 Units total) into the skin daily with breakfast. 09/16/14  Yes Wilber Oliphant, MD  insulin lispro (HUMALOG KWIKPEN) 100 UNIT/ML KiwkPen Inject 10 units with breakfast and 16 units with lunch and 10 units with dinner, hold if CBGs low or not eating and call MD 12/31/14  Yes Wilber Oliphant, MD  lisinopril (PRINIVIL,ZESTRIL) 20 MG tablet Take 1 tablet (20 mg total) by mouth daily. 12/31/14  Yes Wilber Oliphant, MD  metFORMIN (GLUCOPHAGE XR) 500 MG 24 hr tablet Take 1 tablet (500 mg total) by mouth daily with breakfast. 01/14/15 01/14/16 Yes Lucious Groves, DO  metoprolol succinate (TOPROL-XL) 50 MG 24 hr tablet Take 1 tablet (50 mg total) by mouth daily. Take with or immediately following a meal. 08/14/14  Yes Wilber Oliphant, MD  nitroGLYCERIN (NITROSTAT) 0.4 MG SL tablet Place 0.4 mg under the tongue every 5 (five) minutes as  needed for chest pain.   Yes Historical Provider, MD  glucose blood (ONE TOUCH ULTRA TEST) test strip Use as instructed by doctor up to 3x daily,dx code 250.02 insulin requiring 05/20/13   Wilber Oliphant, MD  ondansetron (ZOFRAN ODT) 8 MG disintegrating tablet Take 1 tablet (8 mg total) by mouth every 8 (eight) hours as needed for nausea or vomiting. Patient not taking: Reported on 12/23/2014 10/15/14   Pattricia Boss, MD   BP 140/71 mmHg  Pulse 88  Temp(Src) 98.7 F (37.1 C) (Oral)  Resp 16  Ht 5' (1.524 m)  Wt 228 lb (103.42 kg)  BMI 44.53 kg/m2  SpO2 97% Physical Exam  Constitutional: She is oriented to person, place, and time. She appears well-developed and well-nourished. No distress.  HENT:  Head: Normocephalic and atraumatic.  Eyes: EOM are normal.  Neck: Normal range of motion.  Cardiovascular: Normal rate, regular rhythm and normal heart sounds.   Pulmonary/Chest: Effort normal and breath sounds normal.  Abdominal: Soft. She exhibits no distension. There is tenderness (Left-sided abdominal). There is no rebound and no guarding.  Musculoskeletal: Normal range of motion.  Neurological: She is alert and oriented to person, place, and time.  Skin: Skin is warm and dry. No rash noted.  Psychiatric: She has a normal mood and affect. Judgment normal.  Nursing note and vitals reviewed.   ED Course  Procedures   DIAGNOSTIC STUDIES: Oxygen Saturation is 97% on RA, normal by my interpretation.    COORDINATION OF CARE: 2:40 AM Discussed treatment plan with pt at bedside and pt agreed to plan.   Labs Review Labs Reviewed  COMPREHENSIVE METABOLIC PANEL - Abnormal; Notable for the following:    GFR calc non Af Amer 71 (*)    GFR calc Af Amer 83 (*)    All other components within normal limits  URINALYSIS, ROUTINE W REFLEX MICROSCOPIC - Abnormal; Notable for the following:    Color, Urine AMBER (*)    APPearance CLOUDY (*)    Protein, ur 30 (*)    Leukocytes, UA SMALL (*)     All other components within normal limits  URINE MICROSCOPIC-ADD ON - Abnormal; Notable for the following:    Squamous Epithelial / LPF MANY (*)    Bacteria, UA FEW (*)    All other components within normal limits  LIPASE, BLOOD  CBC WITH DIFFERENTIAL/PLATELET    Imaging Review Ct Abdomen Pelvis Wo Contrast  01/27/2015   CLINICAL DATA:  Acute onset of left flank and left-sided abdominal pain. Initial encounter.  EXAM: CT ABDOMEN AND PELVIS WITHOUT CONTRAST  TECHNIQUE: Multidetector CT imaging of the abdomen and pelvis was performed following the standard protocol without IV contrast.  COMPARISON:  CT of the abdomen and pelvis from 10/15/2014  FINDINGS: There is a mosaic pattern of parenchymal attenuation at the lung bases. An AICD lead is partially imaged.  Mild soft tissue inflammation is noted about the head and proximal body of the pancreas, extending about the hepatic hilum. The pancreatic head is somewhat nodular in appearance, though relatively stable from the study in January. This may simply reflect a portion of the pancreatic head, though an underlying mass cannot be entirely excluded. Soft tissue density at the hepatic hilum is thought to reflect multiple prominent lymph nodes, difficult to fully characterize. These are mildly more prominent than on the prior study.  A prominent calcified granuloma is noted at the hepatic dome. The liver and spleen are otherwise unremarkable. The gallbladder is within normal limits. The pancreas and adrenal glands are unremarkable.  The kidneys are unremarkable in appearance. There is no evidence of hydronephrosis. No renal or ureteral stones are seen. No perinephric stranding is appreciated.  No free fluid is identified. The small bowel is unremarkable in appearance. The stomach is within normal limits. No acute vascular abnormalities are seen. Mild scattered calcification is seen along the distal abdominal aorta and its branches.  The appendix is normal in  caliber, without evidence of appendicitis. The colon is unremarkable in appearance.  The bladder  is mildly distended and grossly unremarkable. The patient is status post hysterectomy. No suspicious adnexal masses are seen. No inguinal lymphadenopathy is seen.  No acute osseous abnormalities are identified. Facet disease noted at the lower lumbar spine.  IMPRESSION: 1. Mild soft tissue inflammation about the head and proximal body of the pancreas, extending about the hepatic hilum. This raises concern for pancreatitis. Nodular appearance of the pancreatic head may be within normal limits, though an underlying mass cannot be entirely excluded. Soft tissue density at the hepatic hilum is thought to reflect multiple prominent lymph nodes, slightly more prominent than on the prior study. Would correlate with pancreatic lab values and consider MRCP to further evaluate the pancreas. 2. Mosaic pattern of parenchymal attenuation at the lung bases; this is nonspecific and may reflect a variety of etiologies, including an infectious process. 3. Mild scattered calcification along the distal abdominal aorta and its branches.   Electronically Signed   By: Garald Balding M.D.   On: 01/27/2015 03:29  I personally reviewed the imaging tests through PACS system I reviewed available ER/hospitalization records through the EMR    EKG Interpretation None      MDM   Final diagnoses:  Abdominal pain, unspecified abdominal location  Abnormal computed tomography of pancreas or bile duct    Abnormal appearance of the pancreas on CT scan.  Lipase is normal.  Patient understands importance of close follow-up with her primary care physician and the likely need for additional imaging including MRI/MRCP to further evaluate.  She understands the possibility of cancer.  She also understands that this may just simply be an incidental finding.  Patient has nausea medicine at home.  Home with a short course of pain medicine.  Patient  understands to return to the emergency department for new or worsening symptoms  I personally performed the services described in this documentation, which was scribed in my presence. The recorded information has been reviewed and is accurate.        Jola Schmidt, MD 01/27/15 [phone]

## 2015-01-27 NOTE — ED Notes (Signed)
Dr. Campos at bedside   

## 2015-01-27 NOTE — ED Notes (Signed)
Pt wants to wait to take the Tylenol due to nausea

## 2015-02-02 ENCOUNTER — Other Ambulatory Visit: Payer: Self-pay

## 2015-02-02 NOTE — Patient Outreach (Signed)
[place] [facility]) Care Management  02/02/2015  AKAYSHA HANCOCK Mar 01, 1951 [id number]  Spoke with Erline Levine with [facility] and was informed that member is not eligible for services due to member does not have [place] Access 2 Medicaid.  Thea Silversmith, RN, MSN, [place] Coordinator Cell: [phone]

## 2015-02-02 NOTE — Patient Outreach (Signed)
[place] [facility]) Care Management  02/02/2015  [name] 06/20/1951 [id number]   Assessment: Call to [facility] Resource-referral for disease management education(hypertension, heart failure, diabetes). Left HIPPA compliant message.  Plan: Care manager will await return call and follow up call by the end of the week if no return call.  Thea Silversmith, RN, MSN, [place] Coordinator Cell: [phone]

## 2015-02-09 ENCOUNTER — Encounter: Payer: Self-pay | Admitting: Internal Medicine

## 2015-02-09 ENCOUNTER — Ambulatory Visit (INDEPENDENT_AMBULATORY_CARE_PROVIDER_SITE_OTHER): Payer: Medicare Other | Admitting: Internal Medicine

## 2015-02-09 VITALS — BP 144/81 | HR 70 | Temp 98.2°F | Ht 60.0 in | Wt 231.8 lb

## 2015-02-09 DIAGNOSIS — E1122 Type 2 diabetes mellitus with diabetic chronic kidney disease: Secondary | ICD-10-CM

## 2015-02-09 DIAGNOSIS — N182 Chronic kidney disease, stage 2 (mild): Secondary | ICD-10-CM | POA: Diagnosis not present

## 2015-02-09 DIAGNOSIS — F17200 Nicotine dependence, unspecified, uncomplicated: Secondary | ICD-10-CM

## 2015-02-09 DIAGNOSIS — F17210 Nicotine dependence, cigarettes, uncomplicated: Secondary | ICD-10-CM

## 2015-02-09 DIAGNOSIS — I130 Hypertensive heart and chronic kidney disease with heart failure and stage 1 through stage 4 chronic kidney disease, or unspecified chronic kidney disease: Secondary | ICD-10-CM | POA: Diagnosis not present

## 2015-02-09 DIAGNOSIS — I10 Essential (primary) hypertension: Secondary | ICD-10-CM

## 2015-02-09 DIAGNOSIS — Z794 Long term (current) use of insulin: Secondary | ICD-10-CM

## 2015-02-09 DIAGNOSIS — R935 Abnormal findings on diagnostic imaging of other abdominal regions, including retroperitoneum: Secondary | ICD-10-CM | POA: Insufficient documentation

## 2015-02-09 DIAGNOSIS — I5042 Chronic combined systolic (congestive) and diastolic (congestive) heart failure: Secondary | ICD-10-CM

## 2015-02-09 LAB — GLUCOSE, CAPILLARY: Glucose-Capillary: 177 mg/dL — ABNORMAL HIGH (ref 70–99)

## 2015-02-09 MED ORDER — INSULIN GLARGINE 100 UNIT/ML ~~LOC~~ SOLN: 40.0000 [IU] | Freq: Every day | SUBCUTANEOUS | Status: DC

## 2015-02-09 MED ORDER — INSULIN LISPRO 100 UNIT/ML (KWIKPEN): PEN_INJECTOR | SUBCUTANEOUS | Status: DC

## 2015-02-09 MED ORDER — FUROSEMIDE 20 MG PO TABS: 40.0000 mg | ORAL_TABLET | Freq: Every day | ORAL | Status: DC

## 2015-02-09 NOTE — Assessment & Plan Note (Signed)
BP Readings from Last 3 Encounters:  02/09/15 144/81  01/27/15 95/65  01/23/15 130/80   Lab Results  Component Value Date   NA 142 01/26/2015   K 3.6 01/26/2015   CREATININE 0.86 01/26/2015   Assessment: Blood pressure control: mildly elevated Progress toward BP goal:  deteriorated Comments: reports compliance with medications  Plan: Medications:  continue current medications toprol xl 50mg , lisinopril 20mg  daily, increased lasix to 40mg  daily for this week or until cards follow up if sooner, norvasc 10mg   Educational resources provided:   Self management tools provided:   Other plans: follow up with cardiology or in clinic on friday

## 2015-02-09 NOTE — Assessment & Plan Note (Addendum)
Lab Results  Component Value Date   HGBA1C 9.6 01/14/2015   HGBA1C 11.2 10/23/2014   HGBA1C 12.7* 07/12/2014    Assessment: Diabetes control: poor control (HgbA1C >9%) Progress toward A1C goal:  improved Comments: improved on last check but reports hypoglycemic episodes as low as 40. Did not bring meter today which makes dosing difficult. CBG today 177 fasting.   Plan: Medications:  continue current medications metformin XR, decreased Lantus to 40 units qhs, humalog 5 with first meal and 5 with last meal and holding if skipping meals or if cbgs <70 Home glucose monitoring: Frequency:   Timing: before meals Instruction/counseling given: reminded to bring blood glucose meter & log to each visit, reminded to bring medications to each visit, discussed foot care, discussed the need for weight loss and discussed diet f/u 2 weeks if not sooner. Will ask CDE to check in with patient as well if possible. She is asked to let us know if hypoglycemia persists right away. She also may have [facility] services who may be able to help?

## 2015-02-09 NOTE — Assessment & Plan Note (Signed)
  Assessment: Progress toward smoking cessation:  smoking the same amount Barriers to progress toward smoking cessation:    Comments: down to 5 cigarettes per day with no intention to quit at this time  Plan: Instruction/counseling given:  I counseled patient on the dangers of tobacco use, advised patient to stop smoking, and reviewed strategies to maximize success. Educational resources provided:    Self management tools provided:    Medications to assist with smoking cessation:  None patient declined Patient agreed to the following self-care plans for smoking cessation: call [facility] (1-800-QUIT-NOW)  Other plans: will continue to encourage cessation

## 2015-02-09 NOTE — Progress Notes (Signed)
Subjective:   Patient ID: [name] female   DOB: [birth year] 64 y.o.   MRN: [id number]  HPI: [name] is a 64 y.o. female with DM2 and other PMH as listed below presents to opc today for ED follow up visit.   Abdominal pain--ED visit 4/26--found to have abnormal CT scan significant for noted mild soft tissue inflammation about the head and proximal body of pancreas extending about hepatic hilum and nodular appearance of pancreatic head, cannot exclude underlying mass. Also noted to have mosaic pattern of parenchymal attenuation at lung bases. LFTs done that visit were wnl and Lipase was 58. Today, she says the pain has improved but she still has some generalized soreness and last recurrence of abdominal pain on the left side was maybe 2 days ago. She has nausea but no recent vomiting. The pain is intermittent and tylenol does relieve the pain some. She does admit to daily alcohol use with more use on the weekends up to >1pack of beer and liquor. She is also still smoking cigarettes, down to ~5/day and not ready or willing to quit at this time. She was referred to GI by ED last month but did not schedule an appointment.   Her daughter is present with her today who reports that her mom is also coughing more occasionally and has worsening swelling of her legs along with worsening DOE. [name] confirms and also notes that she is up to using 3 pillows at night. She reports compliance with her medications. She is due for follow up with cardiology.   DM2--adherent to insulin but having lows. She does still occasionally skip meals. Lowest cbg 40s, occasionally feels like her blood sugar is low at night. Her symptoms of low blood sugar include feeling shaky and sweating. She is able to correct it. Currently her biggest meal of the day is when she wakes up, which is sometimes lunch late breakfast and smallest meal is maybe lunch. Today CBG 177 without eating anything and she did take her lantus.    Past Medical History  Diagnosis Date  . Hyperthyroidism, subclinical   . Post menopausal syndrome   . Obesity   . Cardiac defibrillator in situ     Atlas II VR (SJM) implanted by Dr Lovena Le  . Eczema   . Lipoma   . Chronic ulcer of leg   . Hyperlipidemia   . Chronic combined systolic and diastolic heart failure     a. EF 35-40% in past;  b. Echo 7/13:  EF 45-50%, Gr 2 diast dysfn, mild AI, mild MAC, trivial MR, mild LAE, PASP 47.  [name] NICM (nonischemic cardiomyopathy)   . Diabetes mellitus   . HTN (hypertension)   . Elevated alkaline phosphatase level     GGT and 5'nucleotidase 8/13 normal  . Hx of cardiac cath     a. [place] 2003 normal;  b. [facility] 6/13:  Mild calcification in the LM, o/w normal coronary arteries, EF 45%.   . Sleep apnea     pt denies 04/12/2013  . COPD (chronic obstructive pulmonary disease)     O2 at night  . Automatic implantable cardioverter-defibrillator in situ   . CHF (congestive heart failure)   . Depression   . Implantable cardioverter-defibrillator (ICD) generator end of life   . ATN (acute tubular necrosis) 07/15/2014   Current Outpatient Prescriptions  Medication Sig Dispense Refill  . albuterol (VENTOLIN HFA) 108 (90 BASE) MCG/ACT inhaler Inhale 2 puffs into the lungs every  6 (six) hours as needed. Shortness of breath 1 Inhaler 6  . amLODipine (NORVASC) 10 MG tablet Take 1 tablet (10 mg total) by mouth daily. 30 tablet 3  . aspirin EC 81 MG tablet Take 81 mg by mouth daily.    [name] atorvastatin (LIPITOR) 80 MG tablet Take 1 tablet (80 mg total) by mouth at bedtime. 90 tablet 3  . furosemide (LASIX) 20 MG tablet Take 2 tablets (40 mg total) by mouth daily. 30 tablet 3  . gabapentin (NEURONTIN) 300 MG capsule Take 1 capsule (300 mg total) by mouth at bedtime. 30 capsule 11  . glucose blood (ONE TOUCH ULTRA TEST) test strip Use as instructed by doctor up to 3x daily,dx code 250.02 insulin requiring 100 each 5  . insulin glargine (LANTUS) 100 UNIT/ML injection  Inject 0.4 mLs (40 Units total) into the skin daily with breakfast. 20 mL 3  . insulin lispro (HUMALOG KWIKPEN) 100 UNIT/ML KiwkPen Inject 5 units with breakfast and 5 units with dinner, hold if CBGs low or not eating and call MD 15 mL 3  . lisinopril (PRINIVIL,ZESTRIL) 20 MG tablet Take 1 tablet (20 mg total) by mouth daily. 30 tablet 1  . metFORMIN (GLUCOPHAGE XR) 500 MG 24 hr tablet Take 1 tablet (500 mg total) by mouth daily with breakfast. 90 tablet 3  . metoprolol succinate (TOPROL-XL) 50 MG 24 hr tablet Take 1 tablet (50 mg total) by mouth daily. Take with or immediately following a meal. 30 tablet 3  . nitroGLYCERIN (NITROSTAT) 0.4 MG SL tablet Place 0.4 mg under the tongue every 5 (five) minutes as needed for chest pain.     No current facility-administered medications for this visit.   Family History  Problem Relation Age of Onset  . Stroke Mother   . Seizures Father   . Diabetes Sister   . Asthma Maternal Aunt     aunts  . Asthma Maternal Uncle     uncles  . Heart disease Father   . Heart disease Paternal Aunt     aunts  . Heart disease Paternal Uncle     uncles  . Heart disease Maternal Aunt     aunts  . Heart disease Maternal Uncle     uncles  . Heart disease Maternal Grandfather    History   Social History  . Marital Status: Widowed    Spouse Name: Alroy Dust  . Number of Children: N/A  . Years of Education: 11   Occupational History  . disabled    Social History Main Topics  . Smoking status: Light Tobacco Smoker -- 0.25 packs/day for 35 years    Types: Cigarettes  . Smokeless tobacco: Not on file     Comment: currently smoking 3-4 cigs daily or less.  . Alcohol Use: No  . Drug Use: No  . Sexual Activity: Not on file   Other Topics Concern  . None   Social History Narrative   ** Merged History Encounter **       Married   Review of Systems:  Constitutional:  Denies fever, chills  HEENT:  Denies congestion  Respiratory:  DOE  Cardiovascular:   Denies chest pain. +leg swelling  Gastrointestinal:  Nausea and abdominal pain. Denies vomiting or diarrhea.    Genitourinary:  Denies dysuria  Musculoskeletal:  Lower extremity edema  Neurological:  Denies headaches.    Objective:  Physical Exam: Filed Vitals:   02/09/15 0830  BP: 144/81  Pulse: 70  Temp: 98.2 F (  36.8 C)  TempSrc: Oral  Height: 5' (1.524 m)  Weight: 231 lb 12.8 oz (105.144 kg)  SpO2: 92%   Vitals reviewed. General: sitting in chair, NAD HEENT: EOMI Cardiac: RRR Pulm: clear to auscultation bilaterally Abd: soft, obese, generalized soreness to palpation more on left side of abdomen than right, obese, BS present Ext: +2 pitting edema b/l, DP obtained by doppler Neuro: alert and oriented X3  Assessment & Plan:  Discussed with Dr. Dareen Piano GI referral Adjusted insulin

## 2015-02-09 NOTE — Assessment & Plan Note (Addendum)
?  worsening of CHF. Daughter reports worsening of DOE, cough, lower extremity edema, and having to use up to 3 pillows at night despite compliance with medications. Patient was only taking 20mg  of lasix and does have lower extremity edema on exam.   -increase lasix to 40mg  daily for this week or until cardiology follow can be arranged (due for follow up)--appt made for 5/11 @8am  with Dr. Estanislado Spire office -continue BB, ACEi, lasix, and norvasc

## 2015-02-09 NOTE — Assessment & Plan Note (Signed)
Noted to have recurrent abdominal pain and findings of soft tissue inflammation about head and body of pancreas on recent abdominal imaging along with nodular appearance of pancreatic head that cannot exclude underlying mass. Also noted to have prominent lymph nodes, mosaic pattern at lung bases, and calcifications of distal abdominal aorta and its branches. Last lipase 58 and LFTs WNL.   In comparison to CT A/P with contrast in January 2016, pancreas was noted as normal.   Findings could be in setting of ?chronic pancreatitis given persistent alcohol use and symptoms of intermittent abdominal pain and nausea. She actually has weight gain instead of loss.  Given above findings and persistent abdominal pain, will refer to GI for further evaluation.   -will also try to discuss with pulmonary in regards to lung findings on CT in setting of chronic smoker.

## 2015-02-09 NOTE — Patient Instructions (Addendum)
General Instructions: Dear Ms. Fittipaldi,  Thank you for coming in today.   The clinic staff will call you for your GI appointment. In the meantime, please cut back on your alcohol and smoking use.   Increase your lasix to 40mg  daily and we will try to have an appointment scheduled for you with Dr. Stanford Breed for follow up. If you cannot get the appointment this week, return on Friday for follow up to our clinic. If your breathing gets worse or your leg swelling increases, call and let us know right away. Please weigh yourself daily and call us or cardiologist office if weight up or down 2 or more pounds.   We will decrease your lantus to 40 units and humalog to 5 units with first meal and 5 units with last meal, hold if not eating or skipping meals. PLEASE CHECK YOUR BLOOD SUGAR THREE TIMES A DAY AND BRING YOUR METER WITH YOU TO EACH VISIT. You will need to follow up in clinic for your diabetes in 2 weeks as well.   If you have any more low blood sugars call us right away [phone]. If you have worsening of shortness of breath or abdominal pain, call and let me know right away or go directly to the emergency room  Please bring your medicines with you each time you come to clinic.  Medicines may include prescription medications, over-the-counter medications, herbal remedies, eye drops, vitamins, or other pills.   Progress Toward Treatment Goals:  Treatment Goal 02/09/2015  Hemoglobin A1C improved  Blood pressure deteriorated  Stop smoking smoking the same amount  Prevent falls -    Self Care Goals & Plans:  Self Care Goal 02/09/2015  Manage my medications -  Monitor my health -  Eat healthy foods -  Be physically active -  Stop smoking call [facility] (1-800-QUIT-NOW)    Home Blood Glucose Monitoring 02/09/2015  Check my blood sugar -  When to check my blood sugar before meals     Care Management & Community Referrals:  Referral 01/30/2013  Referrals made for care management support  diabetes educator; nutritionist  Referrals made to community resources smoking cessation; nutrition     Low Blood Sugar Low blood sugar (hypoglycemia) means that the level of sugar in your blood is lower than it should be. Signs of low blood sugar include:  Getting sweaty.  Feeling hungry.  Feeling dizzy or weak.  Feeling sleepier than normal.  Feeling nervous.  Headaches.  Having a fast heartbeat. Low blood sugar can happen fast and can be an emergency. Your doctor can do tests to check your blood sugar level. You can have low blood sugar and not have diabetes. HOME CARE  Check your blood sugar as told by your doctor. If it is less than 70 mg/dl or as told by your doctor, take 1 of the following:  3 to 4 glucose tablets.   cup clear juice.   cup soda pop, not diet.  1 cup milk.  5 to 6 hard candies.  Recheck blood sugar after 15 minutes. Repeat until it is at the right level.  Eat a snack if it is more than 1 hour until the next meal.  Only take medicine as told by your doctor.  Do not skip meals. Eat on time.  Do not drink alcohol except with meals.  Check your blood glucose before driving.  Check your blood glucose before and after exercise.  Always carry treatment with you, such as glucose pills.  Always wear a medical alert bracelet if you have diabetes. GET HELP RIGHT AWAY IF:   Your blood glucose goes below 70 mg/dl or as told by your doctor, and you:  Are confused.  Are not able to swallow.  Pass out (faint).  You cannot treat yourself. You may need someone to help you.  You have low blood sugar problems often.  You have problems from your medicines.  You are not feeling better after 3 to 4 days.  You have vision changes. MAKE SURE YOU:   Understand these instructions.  Will watch this condition.  Will get help right away if you are not doing well or get worse. Document Released: 12/14/2009 Document Revised: 12/12/2011 Document  Reviewed: 12/14/2009 [facility] Patient Information 2015 [place], [place]. This information is not intended to replace advice given to you by your health care provider. Make sure you discuss any questions you have with your health care provider.

## 2015-02-10 ENCOUNTER — Encounter: Payer: Self-pay | Admitting: Physician Assistant

## 2015-02-10 NOTE — Progress Notes (Signed)
INTERNAL MEDICINE TEACHING ATTENDING ADDENDUM - Emilio Baylock, MD: I reviewed and discussed at the time of visit with the resident Dr. Qureshi, the patient's medical history, physical examination, diagnosis and results of pertinent tests and treatment and I agree with the patient's care as documented.  

## 2015-02-11 ENCOUNTER — Ambulatory Visit: Payer: Medicare Other | Admitting: Cardiology

## 2015-02-11 DIAGNOSIS — E119 Type 2 diabetes mellitus without complications: Secondary | ICD-10-CM | POA: Diagnosis not present

## 2015-02-11 DIAGNOSIS — H2513 Age-related nuclear cataract, bilateral: Secondary | ICD-10-CM | POA: Diagnosis not present

## 2015-02-11 LAB — HM DIABETES EYE EXAM

## 2015-02-12 ENCOUNTER — Other Ambulatory Visit: Payer: Self-pay

## 2015-02-12 ENCOUNTER — Encounter: Payer: Self-pay | Admitting: *Deleted

## 2015-02-12 NOTE — Patient Outreach (Signed)
[facility]) Care Management  [place]  02/12/2015   [name] 1951-06-23 [id number]  Subjective: Member states she has had no low blood sugars since her medications were changed at last office visit. Member reports edema to lower extremity decreased. Member reports her weight has decreased since diuretic increased by primary care.  Objective: BP 126/80 mmHg  Pulse 81  Resp 22  SpO2 92%, Breath sounds clear/decreased. Heart rate regular. Lower extremity edema 2-3+(non-pitting)-improved since last visit.  Current Medications:  Current Outpatient Prescriptions  Medication Sig Dispense Refill  . albuterol (VENTOLIN HFA) 108 (90 BASE) MCG/ACT inhaler Inhale 2 puffs into the lungs every 6 (six) hours as needed. Shortness of breath 1 Inhaler 6  . amLODipine (NORVASC) 10 MG tablet Take 1 tablet (10 mg total) by mouth daily. 30 tablet 3  . aspirin EC 81 MG tablet Take 81 mg by mouth daily.    [name] atorvastatin (LIPITOR) 80 MG tablet Take 1 tablet (80 mg total) by mouth at bedtime. 90 tablet 3  . furosemide (LASIX) 20 MG tablet Take 2 tablets (40 mg total) by mouth daily. 30 tablet 3  . gabapentin (NEURONTIN) 300 MG capsule Take 1 capsule (300 mg total) by mouth at bedtime. 30 capsule 11  . glucose blood (ONE TOUCH ULTRA TEST) test strip Use as instructed by doctor up to 3x daily,dx code 250.02 insulin requiring 100 each 5  . insulin glargine (LANTUS) 100 UNIT/ML injection Inject 0.4 mLs (40 Units total) into the skin daily with breakfast. 20 mL 3  . insulin lispro (HUMALOG KWIKPEN) 100 UNIT/ML KiwkPen Inject 5 units with breakfast and 5 units with dinner, hold if CBGs low or not eating and call MD 15 mL 3  . lisinopril (PRINIVIL,ZESTRIL) 20 MG tablet Take 1 tablet (20 mg total) by mouth daily. 30 tablet 1  . metFORMIN (GLUCOPHAGE XR) 500 MG 24 hr tablet Take 1 tablet (500 mg total) by mouth daily with breakfast. 90 tablet 3  . metoprolol succinate (TOPROL-XL) 50 MG 24  hr tablet Take 1 tablet (50 mg total) by mouth daily. Take with or immediately following a meal. 30 tablet 3  . nitroGLYCERIN (NITROSTAT) 0.4 MG SL tablet Place 0.4 mg under the tongue every 5 (five) minutes as needed for chest pain.     No current facility-administered medications for this visit.    Present at home visit was Probation officer and L. [employer] Information systems manager, orientee).   Assessment: Care manager is following member for care coordination and disease education regarding hypertension, diabetes and heart failure.   History of Hypertension-blood pressure within normal limits. Member continues to take medications as prescribed.  History of Diabetes-medications recently changed by primary care due to low blood sugar reading. Since medication change, member reports she has not had any low blood sugar readings. Hi 169; Low 97. Blood sugar reading today was 97 (fasting per member).  History of Heart Failure-member has been weighing self intermittently. However, member states that her weights have decreased since her diuretic was increased. Member reports her weight today is 221.5 pounds. Member questions accuracy of her scales and states that she has ordered a new scale from her insurance company and expects it to arrive within the week.  Plan: home visit next month.  [facility] CM Care Plan Problem One        Patient Outreach from 02/12/2015 in [place] Problem One  blood pressure management   Care Plan  for Problem One  Not Active    [facility] CM Care Plan Problem Two        Patient Outreach from 02/12/2015 in [place] Problem Two  knowledge deficit regarding diabetes management.   Care Plan for Problem Two  Active   Interventions for Problem Two Long Term Goal   Showed/discussed EMMI video-Diabetes type 2. Reinforced patient instructions provided by her primary care office, reviewed medications dosing, provided Diabetes packet.    [facility] Long  Term Goal (31-90) days  Member will verballize at least three strategies for managing diabetes within the next 31-90 days   [facility] Long Term Goal Start Date  01/23/15   [facility] CM Short Term Goal #1 (0-30 days)  member will review"low blood sugar self care handout" and prepare questions if any within the next 30day.   [facility] CM Short Term Goal #1 Start Date  01/23/15   [facility] CM Short Term Goal #1 Met Date   02/12/15 [no questions]   Interventions for Short Term Goal #2   Reinforced treatment of low blood sugar, member verbalized understanding. encourged continued recording of blood sugars   [facility] CM Short Term Goal #2 (0-30 days)  Member will continue to check/record blood sugar within the next 30days.   [facility] CM Short Term Goal #2 Start Date  02/12/15 [goal continued.]   Interventions for Short Term Goal #2  Reiforced the importance of blood sugar monitoring.    [facility] CM Care Plan Problem Three        Patient Outreach from 02/12/2015 in [place] Problem Three  knowledge deficit regarding heart failure management.   Care Plan for Problem Three  Active   [facility] Long Term Goal (31-90) days  Member will be able to verbalize an action plan for heart failure in the next45- 90 days   [facility] Long Term Goal Start Date  02/12/15   Interventions for Problem Three Long Term Goal  Instructed member to monitor weight daily and record, reviewed medication and reinforced to take it as prescribed, encouraged member of her follow up appointment with her provider     [facility] CM Short Term Goal #1 (0-30 days)  Member will weigh self daily   [facility] CM Short Term Goal #1 Start Date  02/12/15   Interventions for Short Term Goal #1  Reinforced importance of daily weights, instructed member the best method of weighing herself      Thea Silversmith, RN, MSN, [place] Coordinator Cell: [phone]

## 2015-02-13 ENCOUNTER — Ambulatory Visit (INDEPENDENT_AMBULATORY_CARE_PROVIDER_SITE_OTHER): Payer: Medicare Other | Admitting: Internal Medicine

## 2015-02-13 ENCOUNTER — Ambulatory Visit: Payer: Medicare Other | Admitting: Internal Medicine

## 2015-02-13 ENCOUNTER — Encounter: Payer: Self-pay | Admitting: Internal Medicine

## 2015-02-13 VITALS — BP 135/70 | HR 99 | Temp 97.9°F | Wt 235.5 lb

## 2015-02-13 DIAGNOSIS — F17210 Nicotine dependence, cigarettes, uncomplicated: Secondary | ICD-10-CM

## 2015-02-13 DIAGNOSIS — E1165 Type 2 diabetes mellitus with hyperglycemia: Secondary | ICD-10-CM | POA: Diagnosis not present

## 2015-02-13 DIAGNOSIS — N182 Chronic kidney disease, stage 2 (mild): Secondary | ICD-10-CM | POA: Diagnosis not present

## 2015-02-13 DIAGNOSIS — I10 Essential (primary) hypertension: Secondary | ICD-10-CM

## 2015-02-13 DIAGNOSIS — E1122 Type 2 diabetes mellitus with diabetic chronic kidney disease: Secondary | ICD-10-CM

## 2015-02-13 DIAGNOSIS — Z6841 Body Mass Index (BMI) 40.0 and over, adult: Secondary | ICD-10-CM

## 2015-02-13 DIAGNOSIS — I5042 Chronic combined systolic (congestive) and diastolic (congestive) heart failure: Secondary | ICD-10-CM

## 2015-02-13 DIAGNOSIS — Z7289 Other problems related to lifestyle: Secondary | ICD-10-CM | POA: Insufficient documentation

## 2015-02-13 DIAGNOSIS — R935 Abnormal findings on diagnostic imaging of other abdominal regions, including retroperitoneum: Secondary | ICD-10-CM

## 2015-02-13 DIAGNOSIS — I130 Hypertensive heart and chronic kidney disease with heart failure and stage 1 through stage 4 chronic kidney disease, or unspecified chronic kidney disease: Secondary | ICD-10-CM

## 2015-02-13 DIAGNOSIS — F1099 Alcohol use, unspecified with unspecified alcohol-induced disorder: Secondary | ICD-10-CM

## 2015-02-13 DIAGNOSIS — F17200 Nicotine dependence, unspecified, uncomplicated: Secondary | ICD-10-CM

## 2015-02-13 DIAGNOSIS — Z794 Long term (current) use of insulin: Secondary | ICD-10-CM

## 2015-02-13 DIAGNOSIS — Z0000 Encounter for general adult medical examination without abnormal findings: Secondary | ICD-10-CM

## 2015-02-13 DIAGNOSIS — Z789 Other specified health status: Secondary | ICD-10-CM

## 2015-02-13 LAB — BASIC METABOLIC PANEL WITH GFR
BUN: 17 mg/dL (ref 6–23)
CO2: 29 mEq/L (ref 19–32)
Calcium: 9 mg/dL (ref 8.4–10.5)
Chloride: 102 mEq/L (ref 96–112)
Creat: 0.84 mg/dL (ref 0.50–1.10)
GFR, Est African American: 87 mL/min
GFR, Est Non African American: 75 mL/min
Glucose, Bld: 250 mg/dL — ABNORMAL HIGH (ref 70–99)
Potassium: 4.5 mEq/L (ref 3.5–5.3)
Sodium: 142 mEq/L (ref 135–145)

## 2015-02-13 NOTE — Assessment & Plan Note (Addendum)
GI appt scheduled for later this month. Still having some occasional left sided lower abdominal pain but has not drank alcohol since last office visit. I did review her CT images of her lungs with Dr. Eppie Gibson during office visit--ground glass appearance present, likely in setting of CHF. We will continue to monitor. Doubtful that she has an acute infectious process at this time as she has a chronic cough in setting of ongoing smoking, DOE that is likely secondary to CHF and patient reports is somewhat improved this visit, is afebrile, and no leukocytosis present at time of scan.   -f/u GI as scheduled -continue to try to abstain from alcohol -hopefully she can cut down on smoking and quit

## 2015-02-13 NOTE — Progress Notes (Addendum)
Subjective:   Patient ID: [name] female   DOB: 1951-03-23 64 y.o.   MRN: [id number]  HPI: [name] is a 64 y.o. female with PMH as listed below who presents to opc for HTN follow up visit.   HTN--she took her BP medications prior to visit and BP is well controlled and tolerating increased dose of 40mg  lasix.   CHF--She was not able to keep the appointment I arranged for her with Dr. Stanford Breed last week because she says she had an eye appt the same day but says they rescheduled her for June. She does report compliance with increased dose of lasix and somewhat improved DOE.   Abdominal pain--still occasionally has lower abdominal pain. She has stopped drinking alcohol since last visit with no signs of alcohol withdrawal at this time. GI follow up visit appt has been arranged per patient.   Her daughter is present with her as well today. [name] would like to leave clinic in a hurry today.   DM2--no more hypoglycemic episodes since adjustment of insulin last visit.  Past Medical History  Diagnosis Date  . Hyperthyroidism, subclinical   . Post menopausal syndrome   . Obesity   . Cardiac defibrillator in situ     Atlas II VR (SJM) implanted by Dr Lovena Le  . Eczema   . Lipoma   . Chronic ulcer of leg   . Hyperlipidemia   . Chronic combined systolic and diastolic heart failure     a. EF 35-40% in past;  b. Echo 7/13:  EF 45-50%, Gr 2 diast dysfn, mild AI, mild MAC, trivial MR, mild LAE, PASP 47.  [name] NICM (nonischemic cardiomyopathy)   . Diabetes mellitus   . HTN (hypertension)   . Elevated alkaline phosphatase level     GGT and 5'nucleotidase 8/13 normal  . Hx of cardiac cath     a. [place] 2003 normal;  b. LHC 6/13:  Mild calcification in the LM, o/w normal coronary arteries, EF 45%.   . Sleep apnea     pt denies 04/12/2013  . COPD (chronic obstructive pulmonary disease)     O2 at night  . Automatic implantable cardioverter-defibrillator in situ   . CHF (congestive heart  failure)   . Depression   . Implantable cardioverter-defibrillator (ICD) generator end of life   . ATN (acute tubular necrosis) 07/15/2014   Current Outpatient Prescriptions  Medication Sig Dispense Refill  . albuterol (VENTOLIN HFA) 108 (90 BASE) MCG/ACT inhaler Inhale 2 puffs into the lungs every 6 (six) hours as needed. Shortness of breath 1 Inhaler 6  . amLODipine (NORVASC) 10 MG tablet Take 1 tablet (10 mg total) by mouth daily. 30 tablet 3  . aspirin EC 81 MG tablet Take 81 mg by mouth daily.    [name] atorvastatin (LIPITOR) 80 MG tablet Take 1 tablet (80 mg total) by mouth at bedtime. 90 tablet 3  . furosemide (LASIX) 20 MG tablet Take 2 tablets (40 mg total) by mouth daily. 30 tablet 3  . gabapentin (NEURONTIN) 300 MG capsule Take 1 capsule (300 mg total) by mouth at bedtime. 30 capsule 11  . glucose blood (ONE TOUCH ULTRA TEST) test strip Use as instructed by doctor up to 3x daily,dx code 250.02 insulin requiring 100 each 5  . insulin glargine (LANTUS) 100 UNIT/ML injection Inject 0.4 mLs (40 Units total) into the skin daily with breakfast. 20 mL 3  . insulin lispro (HUMALOG KWIKPEN) 100 UNIT/ML KiwkPen Inject 5 units  with breakfast and 5 units with dinner, hold if CBGs low or not eating and call MD 15 mL 3  . lisinopril (PRINIVIL,ZESTRIL) 20 MG tablet Take 1 tablet (20 mg total) by mouth daily. 30 tablet 1  . metFORMIN (GLUCOPHAGE XR) 500 MG 24 hr tablet Take 1 tablet (500 mg total) by mouth daily with breakfast. 90 tablet 3  . metoprolol succinate (TOPROL-XL) 50 MG 24 hr tablet Take 1 tablet (50 mg total) by mouth daily. Take with or immediately following a meal. 30 tablet 3  . nitroGLYCERIN (NITROSTAT) 0.4 MG SL tablet Place 0.4 mg under the tongue every 5 (five) minutes as needed for chest pain.     No current facility-administered medications for this visit.   Family History  Problem Relation Age of Onset  . Stroke Mother   . Seizures Father   . Diabetes Sister   . Asthma  Maternal Aunt     aunts  . Asthma Maternal Uncle     uncles  . Heart disease Father   . Heart disease Paternal Aunt     aunts  . Heart disease Paternal Uncle     uncles  . Heart disease Maternal Aunt     aunts  . Heart disease Maternal Uncle     uncles  . Heart disease Maternal Grandfather    History   Social History  . Marital Status: Widowed    Spouse Name: Alroy Dust  . Number of Children: N/A  . Years of Education: 11   Occupational History  . disabled    Social History Main Topics  . Smoking status: Heavy Tobacco Smoker -- 0.50 packs/day for 35 years    Types: Cigarettes  . Smokeless tobacco: Not on file     Comment: currently smoking 3-4 cigs daily or less.  . Alcohol Use: No  . Drug Use: No  . Sexual Activity: Not on file   Other Topics Concern  . None   Social History Narrative   ** Merged History Encounter **       Married   Review of Systems:  Constitutional:  Denies fever, chills  HEENT:  Denies congestion  Respiratory:  Improved DOE  Cardiovascular:  Denies chest pain.   Gastrointestinal:  Denies nausea, vomiting. +occasional abdominal pain   Genitourinary:  Denies dysuria  Musculoskeletal:  +leg swelling  Neurological:  Denies headaches.    Objective:  Physical Exam: Filed Vitals:   02/13/15 0941 02/13/15 0958  BP: 157/96 135/70  Pulse: 102 99  Temp: 97.9 F (36.6 C)   TempSrc: Oral   Weight: 235 lb 8 oz (106.822 kg)   SpO2: 98%    Vitals reviewed. General: sitting in chair, NAD HEENT: EOMI Cardiac: RRR Pulm: clear to auscultation bilaterally, no wheezes, rales, or rhonchi Abd: soft, obese, reports soreness LLQ and epigastric region, nondistended, BS present Ext: +2 pitting edema b/l lower extremities, moving all extremities Neuro: alert and oriented X3  Assessment & Plan:  Discussed with Dr. Eppie Gibson

## 2015-02-13 NOTE — Assessment & Plan Note (Addendum)
Lab Results  Component Value Date   HGBA1C 9.6 01/14/2015   HGBA1C 11.2 10/23/2014   HGBA1C 12.7* 07/12/2014    Assessment: Diabetes control: poor control (HgbA1C >9%) Progress toward A1C goal:  improved Comments: denies any more hypoglycemic episodes since insulin adjustment on last office visit. She did bring her meter but the downloaded report was incorrect with wrong dates and values. On manual review of her meter since last office visit, most CBGs been in mid 100s but some were 210, 286 and another 323 as well.   Plan: Medications:  continue current medications lantus 40 units and humalog 5 units with first meal and last meal Home glucose monitoring: Frequency: 3 times a day Timing: before meals Instruction/counseling given: reminded to bring blood glucose meter & log to each visit and discussed diet Other plans: no changes to regimen today, follow up for diabetes July. She is advised to continue to monitor for hypoglycemia and let us know if any recurrent episodes

## 2015-02-13 NOTE — Assessment & Plan Note (Addendum)
Reports improvement with DOE and maybe mild improvement in lower extremity edema since increasing lasix to 40mg  on last visit, however weight is up 4 pounds since last visit. Compliant with her other medication. Unable to keep cardiology appt I arranged for her last week but says she is rescheduled for June.   -continue lasix 40mg  for now along with BB and ACEi and CCB -check bmet today -need for daily weights, if weight continues to trend up will need to return to clinic and likely further titration of medication doses -follow up with cards -given her weight gain and cardiology appointment rescheduled for late June, recommend clinic follow up in 1-2 weeks or sooner if persistent weight gain, worsening lower extremity edema, or dyspnea

## 2015-02-13 NOTE — Assessment & Plan Note (Signed)
  Assessment and Plan: Progress toward smoking cessation:   still smoking Comments: we discussed smoking cessation briefly again today and daughter will continue to work with mother to help cut down and hopefully quit as well

## 2015-02-13 NOTE — Assessment & Plan Note (Addendum)
She says she has not drank any alcohol since last office visit. I commended her on cessation and encouraged continued cessation with monitoring for possible alcohol withdrawal, possible delirium and/or seizures as well.  If she experiences these symptoms she is advised to come to clinic or go to ED. Daughter confirmed daily alcohol use last visit and on weekends she was estimated to drink at least 1 pack of beer.

## 2015-02-13 NOTE — Assessment & Plan Note (Signed)
Weight up 4 pounds since last visit despite increase in lasix dose.   -continue current medication regimen and lasix dose and monitor for now. She needs to continue to work on diet modifications with her diabetes and will also follow up with cardiology for her heart failure that could be contributing to weight gain as well.

## 2015-02-13 NOTE — Assessment & Plan Note (Addendum)
BP Readings from Last 3 Encounters:  02/13/15 135/70  02/12/15 126/80  02/09/15 144/81   Lab Results  Component Value Date   NA 142 01/26/2015   K 3.6 01/26/2015   CREATININE 0.86 01/26/2015   Assessment: Blood pressure control: controlled Progress toward BP goal:  at goal Comments: took her medications prior to visit.   Plan: Medications:  continue current medications lisinopril 20, toprol xl 50, norvasc 10, and lasix 40mg  Other plans: bmet today

## 2015-02-13 NOTE — Assessment & Plan Note (Signed)
She says her insurance will not cover zostavax vaccine until age 64. Will try to ask RN to verify this information.

## 2015-02-15 NOTE — Patient Instructions (Signed)
Dear [name],  You did not wish to wait for your paperwork prior to departing clinic.   Please weigh yourself daily and if you notice your weight going up further, please call us and let us know right away [phone]. For now, continue your current medications as prescribed. If you have worsening of your leg swelling or shortness of breath at rest or worsening with exertion or chest pain, notify us right away  [place] job on not drinking alcohol. Please follow up with GI for your abdominal pain and if worsening in the meantime, please let us know  Please stop smoking. You can try calling the quitline for assistance as well [phone]

## 2015-02-18 ENCOUNTER — Telehealth: Payer: Self-pay | Admitting: Dietician

## 2015-02-18 NOTE — Progress Notes (Signed)
Case discussed with Dr. Eula Fried at the time of the visit.  We reviewed the resident's history and exam and pertinent patient test results.  I agree with the assessment, diagnosis and plan of care documented in the resident's note.

## 2015-02-20 NOTE — Telephone Encounter (Signed)
Have been unable to get in touch with patient. Yesterday a lady answered and said she was not at home.

## 2015-02-20 NOTE — Telephone Encounter (Signed)
Thanks

## 2015-02-25 ENCOUNTER — Encounter: Payer: Self-pay | Admitting: *Deleted

## 2015-02-26 ENCOUNTER — Ambulatory Visit (INDEPENDENT_AMBULATORY_CARE_PROVIDER_SITE_OTHER): Payer: Medicare Other | Admitting: Physician Assistant

## 2015-02-26 ENCOUNTER — Ambulatory Visit (INDEPENDENT_AMBULATORY_CARE_PROVIDER_SITE_OTHER)
Admission: RE | Admit: 2015-02-26 | Discharge: 2015-02-26 | Disposition: A | Discharge status: Home / Self Care | Payer: Medicare Other | Source: Ambulatory Visit | Attending: Physician Assistant | Admitting: Physician Assistant

## 2015-02-26 ENCOUNTER — Ambulatory Visit (INDEPENDENT_AMBULATORY_CARE_PROVIDER_SITE_OTHER): Payer: Medicare Other | Admitting: *Deleted

## 2015-02-26 ENCOUNTER — Other Ambulatory Visit (INDEPENDENT_AMBULATORY_CARE_PROVIDER_SITE_OTHER): Payer: Medicare Other

## 2015-02-26 ENCOUNTER — Encounter: Payer: Self-pay | Admitting: Physician Assistant

## 2015-02-26 VITALS — BP 126/74 | HR 88 | Ht 60.25 in | Wt 229.2 lb

## 2015-02-26 DIAGNOSIS — R9389 Abnormal findings on diagnostic imaging of other specified body structures: Secondary | ICD-10-CM

## 2015-02-26 DIAGNOSIS — R938 Abnormal findings on diagnostic imaging of other specified body structures: Secondary | ICD-10-CM

## 2015-02-26 DIAGNOSIS — I428 Other cardiomyopathies: Secondary | ICD-10-CM

## 2015-02-26 DIAGNOSIS — R1013 Epigastric pain: Secondary | ICD-10-CM | POA: Diagnosis not present

## 2015-02-26 DIAGNOSIS — R1012 Left upper quadrant pain: Secondary | ICD-10-CM

## 2015-02-26 DIAGNOSIS — R110 Nausea: Secondary | ICD-10-CM | POA: Diagnosis not present

## 2015-02-26 DIAGNOSIS — I429 Cardiomyopathy, unspecified: Secondary | ICD-10-CM | POA: Diagnosis not present

## 2015-02-26 DIAGNOSIS — R932 Abnormal findings on diagnostic imaging of liver and biliary tract: Secondary | ICD-10-CM | POA: Diagnosis not present

## 2015-02-26 LAB — COMPREHENSIVE METABOLIC PANEL
ALT: 13 U/L (ref 0–35)
AST: 15 U/L (ref 0–37)
Albumin: 4.1 g/dL (ref 3.5–5.2)
Alkaline Phosphatase: 112 U/L (ref 39–117)
BUN: 15 mg/dL (ref 6–23)
CO2: 33 mEq/L — ABNORMAL HIGH (ref 19–32)
Calcium: 9.3 mg/dL (ref 8.4–10.5)
Chloride: 101 mEq/L (ref 96–112)
Creatinine, Ser: 0.96 mg/dL (ref 0.40–1.20)
GFR: 75.88 mL/min (ref >60.00)
Glucose, Bld: 122 mg/dL — ABNORMAL HIGH (ref 70–99)
Potassium: 4.1 mEq/L (ref 3.5–5.1)
Sodium: 142 mEq/L (ref 135–145)
Total Bilirubin: 0.7 mg/dL (ref 0.2–1.2)
Total Protein: 7.2 g/dL (ref 6.0–8.3)

## 2015-02-26 LAB — LIPASE: Lipase: 17 U/L (ref 11.0–59.0)

## 2015-02-26 MED ORDER — IOHEXOL 300 MG/ML  SOLN
100.0000 mL | Freq: Once | INTRAMUSCULAR | Status: AC | PRN
  Administered 2015-02-26: 100 mL via INTRAVENOUS

## 2015-02-26 NOTE — Progress Notes (Signed)
Remote ICD transmission.   

## 2015-02-26 NOTE — Patient Instructions (Addendum)
Please go to the basement level to have your labs drawn.   You have been scheduled for a CT scan of the abdomen and pelvis at [facility] ([address]---this is in the same building as [facility]).   You are scheduledtoday, 02-26-15.  Arrive at 2:15 am prior to your appointment time for registration. Please follow the written instructions below on the day of your exam:  WARNING: IF YOU ARE ALLERGIC TO IODINE/X-RAY DYE, PLEASE NOTIFY RADIOLOGY IMMEDIATELY AT [phone]! YOU WILL BE GIVEN A 13 HOUR PREMEDICATION PREP.  1) Do not eat or drink anything after  10:00 am  (4 hours prior to your test)   You may take any medications as prescribed with a small amount of water except for the following: Metformin, Glucophage, Glucovance, Avandamet, Riomet, Fortamet, Actoplus Met, Janumet, Glumetza or Metaglip. The above medications must be held the day of the exam AND 48 hours after the exam.  The purpose of you drinking the oral contrast is to aid in the visualization of your intestinal tract. The contrast solution may cause some diarrhea. Before your exam is started, you will be given a small amount of fluid to drink. Depending on your individual set of symptoms, you may also receive an intravenous injection of x-ray contrast/dye. Plan on being at [facility] for 30 minutes or long, depending on the type of exam you are having performed.  If you have any questions regarding your exam or if you need to reschedule, you may call the CT department at [phone] between the hours of 8:00 am and 5:00 pm, Monday-Friday.  ________________________________________________________________________          Normal BMI (Body Mass Index- based on height and weight) is between 19 and 25. Your BMI today is Body mass index is 44.42 kg/(m^2). . Please consider follow up  regarding your BMI with your Primary Care Provider. 

## 2015-02-26 NOTE — Progress Notes (Signed)
Patient ID: [name], female   DOB: 08-Jul-1951, 64 y.o.   MRN: [id number]   Subjective:    Patient ID: [name], female    DOB: [birth year], 64 y.o.   MRN: [id number]  HPI [name] is a pleasant 64 year old African-American female [facility] Dr. Charolotte Eke for evaluation of recent upper abdominal pain and possible pancreatitis. Patient has history of adult-onset diabetes mellitus, congestive heart failure with a nonischemic cardiomyopathy and EF of 35-40%. She is status post defibrillator placement, has history of hypertension. Patient says that she has had pain in her left mid and left upper quadrant radiating around into her back over the past 4-6 weeks. This pain has been constant and somewhat worse at night. She is currently taking and analgesic and says that that is helpful but feels that the pain may have progressed a bit since onset. She feels more uncomfortable after meals her appetite has been somewhat decreased though she's not lost any significant amount of weight. She has not had any fever or chills. Bowel movements have been constipated. She has had some nausea but no regular vomiting. He has no prior history of liver or pancreatic disease. She denies any regular EtOH use and has not been placed on any new meds over the past few months.  Because of her pain she had undergone CT scan of the abdomen and pelvis on 01/27/2015 and this showed mild soft tissue inflammation around the head and proximal body of the pancreas extending above the hepatic hilum question pancreatitis are somewhat nodular appearance of the pancreatic head cannot rule out underlying mass and soft tissue density at the hepatic hilum thought to reflect multiple prominent lymph nodes. Labs done at that time with normal LFTs and normal lipase CBC was also within normal limits.  Review of Systems Pertinent positive and negative review of systems were noted in the above HPI section.  All other  review of systems was otherwise negative.  Outpatient Encounter Prescriptions as of 02/26/2015  Medication Sig  . albuterol (VENTOLIN HFA) 108 (90 BASE) MCG/ACT inhaler Inhale 2 puffs into the lungs every 6 (six) hours as needed. Shortness of breath  . amLODipine (NORVASC) 10 MG tablet Take 1 tablet (10 mg total) by mouth daily.  [name] aspirin EC 81 MG tablet Take 81 mg by mouth daily.  [name] atorvastatin (LIPITOR) 80 MG tablet Take 1 tablet (80 mg total) by mouth at bedtime.  . furosemide (LASIX) 20 MG tablet Take 2 tablets (40 mg total) by mouth daily.  [name] gabapentin (NEURONTIN) 300 MG capsule Take 1 capsule (300 mg total) by mouth at bedtime.  [name] glucose blood (ONE TOUCH ULTRA TEST) test strip Use as instructed by doctor up to 3x daily,dx code 250.02 insulin requiring  . insulin glargine (LANTUS) 100 UNIT/ML injection Inject 0.4 mLs (40 Units total) into the skin daily with breakfast.  . insulin lispro (HUMALOG KWIKPEN) 100 UNIT/ML KiwkPen Inject 5 units with breakfast and 5 units with dinner, hold if CBGs low or not eating and call MD  . lisinopril (PRINIVIL,ZESTRIL) 20 MG tablet Take 1 tablet (20 mg total) by mouth daily.  . metFORMIN (GLUCOPHAGE XR) 500 MG 24 hr tablet Take 1 tablet (500 mg total) by mouth daily with breakfast.  . metoprolol succinate (TOPROL-XL) 50 MG 24 hr tablet Take 1 tablet (50 mg total) by mouth daily. Take with or immediately following a meal.  . nitroGLYCERIN (NITROSTAT) 0.4 MG SL tablet Place 0.4 mg  under the tongue every 5 (five) minutes as needed for chest pain.  [name] HYDROcodone-acetaminophen (NORCO/VICODIN) 5-325 MG per tablet    No facility-administered encounter medications on file as of 02/26/2015.   Allergies  Allergen Reactions  . Avandia [Rosiglitazone Maleate]     unknown  . Hydrocortisone     unknown  . Pioglitazone     REACTION: congestive heart failure ACTOS   Patient Active Problem List   Diagnosis Date Noted  . Abnormal CT of the abdomen 02/09/2015    . CAP (community acquired pneumonia) 08/08/2014  . Diabetic gastroparesis associated with type 2 diabetes mellitus 07/12/2014  . Depression 05/22/2014  . R thigh pedunculated lesion 04/08/2014  . Implantable cardioverter-defibrillator (ICD) generator end of life   . Health care maintenance 01/22/2013  . Nonischemic cardiomyopathy 04/03/2012  . Type 2 diabetes mellitus with stage 2 chronic kidney disease 04/03/2012  . DOE (dyspnea on exertion) 10/19/2011  . ALOPECIA 01/13/2010  . TOBACCO ABUSE 10/26/2009  . HYPERTHYROIDISM, SUBCLINICAL 05/06/2009  . POSTMENOPAUSAL SYNDROME 04/27/2009  . HYSTEROSCOPY, HX OF 04/27/2009  . SLEEP APNEA 08/20/2008  . Body mass index (BMI) of 45.0-49.9 in adult 02/11/2008  . ECZEMA 11/26/2007  . LIPOMA 08/01/2007  . Hyperlipidemia associated with type 2 diabetes mellitus 05/25/2007  . HYPERTENSION, BENIGN ESSENTIAL 05/25/2007  . Chronic combined systolic and diastolic heart failure 123456  . Automatic implantable cardioverter-defibrillator in situ 05/04/2007   History   Social History  . Marital Status: Widowed    Spouse Name: Alroy Dust  . Number of Children: 3  . Years of Education: 11   Occupational History  . disabled    Social History Main Topics  . Smoking status: Heavy Tobacco Smoker -- 0.50 packs/day for 35 years    Types: Cigarettes  . Smokeless tobacco: Never Used     Comment: currently smoking 3-4 cigs daily or less.  . Alcohol Use: 0.6 oz/week    1 Glasses of wine per week     Comment: Occassionally  . Drug Use: No  . Sexual Activity: Not on file   Other Topics Concern  . Not on file   Social History Narrative   ** Merged History Encounter **       Married    Ms. Whisner's family history includes Asthma in her maternal aunt and maternal uncle; Diabetes in her sister; Heart disease in her father, maternal aunt, maternal grandfather, maternal uncle, paternal aunt, and paternal uncle; Seizures in her father; Stroke in her  mother. There is no history of Colon cancer, Colon polyps, Esophageal cancer, Kidney disease, or Gallbladder disease.      Objective:    Filed Vitals:   02/26/15 0813  BP: 126/74  Pulse: 88    Physical Exam well-developed African-American female in no acute distress, blood pressure 126/74 pulse 88 height 5 foot weight 229. HEENT; nontraumatic normocephalic EOMI PERRLA sclera anicteric, Supple; no JVD, Cardiovascular ;regular rate and rhythm with S1-S2 no murmur or gallop, Pulmonary; clear bilaterally, Abdomen; soft she has some tenderness in the epigastrium and right upper quadrant and left upper quadrant there is no definite palpable mass or hepatosplenomegaly bowel sounds are present, Rectal; exam not done, Ext; no clubbing cyanosis or edema skin warm and dry, Psych; mood and affect appropriate       Assessment & Plan:   #1 64 yo female with 4-6 week hx of persistent upper and LUQ abdominal pain radiating to her back .assocoiated with intermittent nausea, decrease in appetite Abnormal Non contrasted  CT raising question of pancreatitis involving head of pancreas with prominent nodes vs underlying mass- LFT's and Lipase were normal Concerned about underlying pancreatic neoplasm #2 NICM -EF 35% #3 s/p ICD placement #4 CHF #5 AODM #6 HTN #7 sleep apnea  Plan; repeat labs today as one month later- include CA19-9  Pt cannot undergo MRI because of ICD so will repeat CT with IV contrast this time She will likely need EUS with Dr Ardis Hughs and this was discussed today as well She will continue hydrocodone for now prn   Edwing Figley Genia Harold PA-C 02/26/2015   Cc: Wilber Oliphant, MD

## 2015-02-27 LAB — CANCER ANTIGEN 19-9: CA 19-9: 16.4 U/mL (ref <35.0)

## 2015-02-27 NOTE — Progress Notes (Signed)
i agree. Her repeat CT scan (this time with IV contrast) shows normal pancreas.  The 10/2014 CT (also with IV contrast) was normal as well and so I suspect the findings reported on 01/2015 Ct were related to imaging limitations without IV contrast.

## 2015-03-03 ENCOUNTER — Other Ambulatory Visit: Payer: Self-pay

## 2015-03-03 MED ORDER — OMEPRAZOLE 20 MG PO CPDR: DELAYED_RELEASE_CAPSULE | ORAL | Status: DC

## 2015-03-06 LAB — CUP PACEART REMOTE DEVICE CHECK
Battery Remaining Longevity: 93 mo
Battery Remaining Percentage: 92 %
Battery Voltage: 3.17 V
Brady Statistic RV Percent Paced: 1 %
Date Time Interrogation Session: 20160526060019
HighPow Impedance: 46 Ohm
HighPow Impedance: 46 Ohm
Lead Channel Impedance Value: 430 Ohm
Lead Channel Pacing Threshold Amplitude: 0.75 V
Lead Channel Pacing Threshold Pulse Width: 0.5 ms
Lead Channel Sensing Intrinsic Amplitude: 11.9 mV
Lead Channel Setting Pacing Amplitude: 2.5 V
Lead Channel Setting Pacing Pulse Width: 0.5 ms
Lead Channel Setting Sensing Sensitivity: 0.5 mV
Pulse Gen Serial Number: 7206538
Zone Setting Detection Interval: 250 ms
Zone Setting Detection Interval: 300 ms

## 2015-03-12 ENCOUNTER — Other Ambulatory Visit: Payer: Self-pay

## 2015-03-12 NOTE — Patient Outreach (Signed)
[facility]) Care Management  [place]  03/12/2015   YIDES SAIDI 1950/11/02 [id number]  Subjective: Member reports that she had been checking her blood sugars but when asked about recording blood sugars, she states "I just gave up on it".  Objective: BP 106/78 mmHg  Pulse 89  Resp 28  Wt 227 lb (102.967 kg)  SpO2 94% Lung sounds diminished with scattered wheezing noted bilaterally. Intermittent nonproductive/ dry cough noted. Respirations even and unlabored. Swelling to bilateral lower extremities unchanged but non-pitting.   Current Medications:  Current Outpatient Prescriptions  Medication Sig Dispense Refill  . albuterol (VENTOLIN HFA) 108 (90 BASE) MCG/ACT inhaler Inhale 2 puffs into the lungs every 6 (six) hours as needed. Shortness of breath 1 Inhaler 6  . amLODipine (NORVASC) 10 MG tablet Take 1 tablet (10 mg total) by mouth daily. 30 tablet 3  . aspirin EC 81 MG tablet Take 81 mg by mouth daily.    [name] atorvastatin (LIPITOR) 80 MG tablet Take 1 tablet (80 mg total) by mouth at bedtime. 90 tablet 3  . furosemide (LASIX) 20 MG tablet Take 2 tablets (40 mg total) by mouth daily. 30 tablet 3  . gabapentin (NEURONTIN) 300 MG capsule Take 1 capsule (300 mg total) by mouth at bedtime. 30 capsule 11  . glucose blood (ONE TOUCH ULTRA TEST) test strip Use as instructed by doctor up to 3x daily,dx code 250.02 insulin requiring 100 each 5  . HYDROcodone-acetaminophen (NORCO/VICODIN) 5-325 MG per tablet   0  . insulin glargine (LANTUS) 100 UNIT/ML injection Inject 0.4 mLs (40 Units total) into the skin daily with breakfast. 20 mL 3  . insulin lispro (HUMALOG KWIKPEN) 100 UNIT/ML KiwkPen Inject 5 units with breakfast and 5 units with dinner, hold if CBGs low or not eating and call MD 15 mL 3  . lisinopril (PRINIVIL,ZESTRIL) 20 MG tablet Take 1 tablet (20 mg total) by mouth daily. 30 tablet 1  . metoprolol succinate (TOPROL-XL) 50 MG 24 hr tablet Take 1 tablet (50 mg  total) by mouth daily. Take with or immediately following a meal. 30 tablet 3  . nitroGLYCERIN (NITROSTAT) 0.4 MG SL tablet Place 0.4 mg under the tongue every 5 (five) minutes as needed for chest pain.    [name] omeprazole (PRILOSEC) 20 MG capsule Take one tablet daily 30 minutes before breakfast 30 capsule 2  . metFORMIN (GLUCOPHAGE XR) 500 MG 24 hr tablet Take 1 tablet (500 mg total) by mouth daily with breakfast. (Patient not taking: Reported on 03/12/2015) 90 tablet 3   No current facility-administered medications for this visit.    Functional Status:  In your present state of health, do you have any difficulty performing the following activities: 03/12/2015 02/13/2015  Hearing? N N  Vision? Y N  Difficulty concentrating or making decisions? N N  Walking or climbing stairs? N Y  Dressing or bathing? N N  Doing errands, shopping? N N  Preparing Food and eating ? - -  Using the Toilet? - -  In the past six months, have you accidently leaked urine? - -  Do you have problems with loss of bowel control? - -  Managing your Medications? - -  Managing your Finances? - -  Housekeeping or managing your Housekeeping? - -    Fall/Depression Screening: PHQ 2/9 Scores 02/13/2015 02/09/2015 01/14/2015 01/08/2015 12/23/2014 10/23/2014 09/02/2014  PHQ - 2 Score 0 0 0 0 0 0 0    Assessment: Present at visit -  Calton Golds and Erskin Burnet (care management Coordinators).  64 year old female, with history of diabetes, heart failure and hypertension.  Diabetes: Member had been checking blood sugars but missed checking for 3 days since s he was with her daughter out of town, however she states taking medications as ordered. 7 day average 205 n=5, 14 day average 199 n=10, 30 day average=199 n=26. Her high over the past month is 349 and her low is 97. Blood sugar today is 250. Member reports she still would like assistance with managing diabetes.  Heart Failure: Member acknowledges that she has not been weighing herself  daily. Has a weighing scale but questions accuracy of scale. Member weighed on the scale during home visit and weighed 227 pounds. Member would like to continue heart failure management and education. Member stated that she initially had asked her daughter to buy her a scale but she changed her mind and decided to get it from the insurance company. Care manager discussed availability of scale and scale was provided for her to use.  Member acknowledges intermittent coughing. Auscultated with noted scattered wheezing. Member reports not using her inhaler. Case manager encouraged her to use her inhaler during visit. No wheezing noted 30 minutes following the inhaler and her coughing subsided. Discussed with member risks of smoking with oxygen equipment present; also about smoking cessation and the risks and adverse effects of smoking to her heart, lung, blood pressure.  Oxygen equipment-- Member reports using oxygen for the past 5 years obtained from [place]. Member reports using oxygen on 3 liters per minute at hours of sleep or bedtime as needed. She states that oxygen tank has not been serviced since initiation and also reports that oxygen needs had not been reevaluated. However, she is receiving oxygen tubing supplies via mail.  [facility] CM Care Plan Problem One        Patient Outreach from 03/12/2015 in [place] Problem One  blood pressure management   Care Plan for Problem One  Not Active    [facility] CM Care Plan Problem Two        Patient Outreach from 03/12/2015 in [place] Problem Two  knowledge deficit regarding diabetes management.   Care Plan for Problem Two  Active   Interventions for Problem Two Long Term Goal   Emphasized the importance of  checking blood sugars & recording   [facility] Long Term Goal (31-90) days  Member will verballize at least three strategies for managing diabetes within the next 31-90 days   [facility] Long Term Goal Start Date   01/23/15   [facility] CM Short Term Goal #1 (0-30 days)  member will review"low blood sugar self care handout" and prepare questions if any within the next 30day.   [facility]N CM Short Term Goal #1 Start Date  01/23/15   [facility]N CM Short Term Goal #1 Met Date   02/12/15 [no questions]   [facility]N CM Short Term Goal #2 (0-30 days)  Member will continue to check/record blood sugar within the next 30days.   [facility]N CM Short Term Goal #2 Start Date  03/12/15 [goal continued.]   [facility]N CM Short Term Goal #2 Met Date  -- [not checking blood sugars as ordered and not recording it]   Interventions for Short Term Goal #2  Reinforced the importance of blood sugar monitoring,  rediscussed member's goals for herself     [facility] CM Care Plan Problem Three  Patient Outreach from 03/12/2015 in [place] Problem Three  knowledge deficit regarding heart failure management.   Care Plan for Problem Three  Active   THN Long Term Goal (31-90) days  Member will be able to verbalize an action plan for heart failure in the next45- 90 days   [facility]N Long Term Goal Start Date  02/12/15   Interventions for Problem Three Long Term Goal  Reinforced the importance of weighing self daily and recording it,  reinstructed on Heart Failure Zone tool,  encouraged to take medication,  reinforced follow up appointment with her provider     [facility]N CM Short Term Goal #1 (0-30 days)  Member will weigh self daily   [facility] CM Short Term Goal #1 Start Date  03/12/15   [facility] CM Short Term Goal #1 Met Date  -- [not met, member not weighing herself.]   Interventions for Short Term Goal #1  Reinforced importance of daily weights, rediscussed about obtaining weighing  scale,  provided member a scale she can use,  reestablish the goals she had set for herself     Plan: Provide a weighing scale for her use at home. Update primary care provider regarding reevaluation of oxygen needs. Contact [facility] regarding servicing of oxygen  equipments.   Trayvion Embleton A. Cole Eastridge, BSN, RN-BC [place] Coordinator Cell: [phone]

## 2015-03-13 ENCOUNTER — Encounter: Payer: Self-pay | Admitting: Cardiology

## 2015-03-13 ENCOUNTER — Telehealth: Payer: Self-pay | Admitting: *Deleted

## 2015-03-13 ENCOUNTER — Other Ambulatory Visit: Payer: Self-pay | Admitting: *Deleted

## 2015-03-13 NOTE — Patient Outreach (Signed)
[facility]) Care Management  03/13/2015  TYRIEL KARGBO 1950-11-13 [id number]   Called [facility] to inform of member's use of home oxygen that had not been serviced since initiation 4-5 years ago per member's report. Notified [facility] staff that member admits to smoking. Requested "no smoking sign" to be placed on the premises. [facility] staff states will put a request to service oxygen equipment and to put a no smoking sign as well.    Mayola Mcbain A. Cire Clute, BSN, RN-BC [place] Coordinator Cell: [phone]

## 2015-03-13 NOTE — Patient Outreach (Signed)
[facility]) Care Management  03/13/2015  [name] May 15, 1951 [id number]   Called member provider's office and left message to voicemail in order to update provider regarding member's noted cough and wheezing that cleared after 2 puffs of inhaler, also that member reports using 3 liters of oxygen at bedtime as needed. Question if member needs to be reevaluate for continued use of oxygen. Case manager left contact information to call back if they have any questions.  Adiah Guereca A. Katrese Shell, BSN, RN-BC [place] Coordinator Cell: [phone]

## 2015-03-13 NOTE — Telephone Encounter (Signed)
Call from [place], Care Manager with [facility] stating they visited pt yesterday and want to report coughing and wheezing.  Inhaler used while they were there and wheezing improved. The concern is pt has Oxygen equipment at home for 5 years and pt uses 3 liters at bedtime.  Pt feels this equipment has not been serviced since beginning. Does pt need reevaluation for need of O2, [facility] can not find any Dx.that requires oxygen use.  If there is a Dx for the use of O2 they want to be notified at  [phone]  Please advise

## 2015-03-14 NOTE — Telephone Encounter (Signed)
Hi gayle,  She has followed with Dr. Gwenette Greet in the past and I had asked for information in regards to her oxygen in the past. This does need to be re-evaluated. Can we please do the following.   1) find out if she is still seeing Dr. Gwenette Greet as she should be? If not, can we get her an appointment 2) confirm who is prescribing the oxygen 3) she will need an office visit and repeat PFTs  Thank you,  Dr q

## 2015-03-16 ENCOUNTER — Other Ambulatory Visit: Payer: Self-pay | Admitting: *Deleted

## 2015-03-16 NOTE — Telephone Encounter (Signed)
[facility] informed Message to front desk to schedule appointment with new PCP

## 2015-03-16 NOTE — Telephone Encounter (Signed)
Thank you gayle!  That was very helpful. Please let her thn nurse know that oxygen is prescribed by cardiology. Also, can we please have ms Dady scheduled with her new pcp next month when she is available and she may need repeat PFTs at that time per her new pcp.   Thanks,  Dr q

## 2015-03-16 NOTE — Telephone Encounter (Addendum)
I talked with [name] and she states she is doing well.  She said she only saw Dr. Gwenette Greet while in the hospital. She does not see him now. She uses oxygen on hot days and at night.   She has appointments with 2 MD's so wanted to wait until she is assigned new MD before she comes to clinic.  I called [facility] and they care for patients oxygen. Orders have come from DR. Crenshaw with [facility]. [name] was seen be Dr. Gwenette Greet on 11/02/11 and they would follow on a PRN basis. Last PFT 11/02/11

## 2015-03-16 NOTE — Patient Outreach (Signed)
[place] [facility]) Care Management  03/16/2015  [name] December 01, 1950 [id number]   Received return call from [facility] (staff from Primary care provider office) in response to question regarding member's oxygen use. Reports that Primary care provider was not aware of member's oxygen usage. Per [name], member's last pulmonary function test was done in 2013. Oxygen was then ordered by her cardiologist- Dr. Stanford Breed to be used on as needed basis; apparently, member had a one time visit with Pulmonologist. Staff directed care management coordinator to call Dr. Jacalyn Lefevre office to seek clarification/ reevaluation and recertification of member's oxygen use if need be. Staff also states that primary care provider will order updated pulmonary function test on member's next appointment.  Plan: Will call Dr. Jacalyn Lefevre office to clarify need for reevaluation of member's oxygen use.  Continue to follow up with member.  [name], BSN, RN-BC [place] Coordinator Cell: [phone]

## 2015-03-17 ENCOUNTER — Encounter: Payer: Self-pay | Admitting: Internal Medicine

## 2015-03-18 ENCOUNTER — Other Ambulatory Visit: Payer: Self-pay | Admitting: *Deleted

## 2015-03-18 ENCOUNTER — Telehealth: Payer: Self-pay | Admitting: Cardiology

## 2015-03-18 NOTE — Patient Outreach (Addendum)
[facility]) Care Management  03/18/2015  RETTIE BARRETO Mar 20, 1951 [id number]   Care coordination call to [facility] cardiologist (Dr. Stanford Breed) informing about member still using oxygen at bedtime at 3 liters per minute per nasal cannula for shortness of breath. Spoke with Rollene Fare who took message for Dr.Crenshaw's  nurse, and inquired if member's oxygen is deemed for reevaluation at this point since it was ordered by cardiologist in 2013. Per member report, oxygen has not been reevaluated or serviced since initiation. Member's primary care notified. and was referred back to cardiologist who had ordered it.  Received return call from [place] (cardiologist's nurse) and discussed with her about need to reevaluate oxygen and she stated will discuss with member on her upcoming appointment on March 27, 2015.  Plan: Will continue to follow. Home visit scheduled on July 7.   Daishaun Ayre A. Taiga Lupinacci, BSN, RN-BC [place] Coordinator Cell: [phone]

## 2015-03-18 NOTE — Telephone Encounter (Signed)
Spoke with [name], the patient has oxygen in the home that she uses at bedtime due to SOB. The need for oxygen has not been reassessed since 2013. She is currently using 3 liter per min. She has an upcoming appt with pa in June and they ask for [place] to evaluate at that time the need for the 02. Will make pa aware.

## 2015-03-18 NOTE — Telephone Encounter (Signed)
Edwena Felty is calling in wanting to speak with Dr. Jacalyn Lefevre nurse about the pt's oxygen usage. Please call  Thanks

## 2015-03-20 ENCOUNTER — Encounter (HOSPITAL_COMMUNITY): Admission: AD | Payer: Self-pay

## 2015-03-20 ENCOUNTER — Encounter: Payer: Self-pay | Admitting: Physician Assistant

## 2015-03-20 ENCOUNTER — Ambulatory Visit (INDEPENDENT_AMBULATORY_CARE_PROVIDER_SITE_OTHER): Payer: Medicare Other | Admitting: Physician Assistant

## 2015-03-20 ENCOUNTER — Ambulatory Visit (HOSPITAL_COMMUNITY): Admission: AD | Admit: 2015-03-20 | Payer: Self-pay | Admitting: Gastroenterology

## 2015-03-20 VITALS — BP 124/78 | HR 60 | Ht 60.25 in | Wt 232.4 lb

## 2015-03-20 DIAGNOSIS — R109 Unspecified abdominal pain: Secondary | ICD-10-CM | POA: Diagnosis not present

## 2015-03-20 DIAGNOSIS — R1032 Left lower quadrant pain: Secondary | ICD-10-CM

## 2015-03-20 SURGERY — COLONOSCOPY WITH PROPOFOL
Anesthesia: Monitor Anesthesia Care

## 2015-03-20 MED ORDER — POLYETHYLENE GLYCOL 3350 17 GM/SCOOP PO POWD: 1.0000 | Freq: Once | ORAL | Status: DC

## 2015-03-20 NOTE — Progress Notes (Signed)
i agree with the above note, plan 

## 2015-03-20 NOTE — Patient Instructions (Signed)
You have been scheduled for a colonoscopy/endoscopy. Please follow written instructions given to you at your visit today.  Please pick up your prep supplies at the pharmacy within the next 1-3 days. If you use inhalers (even only as needed), please bring them with you on the day of your procedure. Your physician has requested that you go to www.startemmi.com and enter the access code given to you at your visit today. This web site gives a general overview about your procedure. However, you should still follow specific instructions given to you by our office regarding your preparation for the procedure.  Continue Prilosec 20 mg every day in the morning for 3 months

## 2015-03-20 NOTE — Progress Notes (Signed)
Patient ID: [name], female   DOB: [birth year], 64 y.o.   MRN: [id number]   Subjective:    Patient ID: [name], female    DOB: April 18, 1951, 64 y.o.   MRN: [id number]  HPI [name] is a pleasant 63 year old African-American female who has recently been undergoing evaluation for left-sided abdominal pain. She had been referred to GI from [facility]  internal medicine clinic because of  abnormal CT scan with possible pancreatitis.he has history of insulin-dependent diabetes mellitus, congestive heart failure with nonischemic cardiomyopathy and EF of 35-40% and is status post defibrillator placement. Also with hypertension. She had CT of the abdomen and pelvis done on 01/27/2015 without IV contrast and this showed mild soft tissue inflammation around the head and proximal body of the pancreas extending above the hepatic hilum also with somewhat nodular appearance of the pancreatic head. Labs done at that time showed normal LFTs and normal lipase and CBC. We scheduled her for repeat CT scan of the abdomen and pelvis with contrast which was done on 02/26/2015. And this was a negative study with no abdominal or pelvic findings in stable most Attenuation pattern of the lung bases. It is not clear whether she actually had any inflammation of her pancreas or whether findings on initial CT were due to limitations of contrast. She comes back in today for follow-up. She has been on Prilosec 20 mg by mouth daily over the past several weeks. She says that overall she does feel better and that her previous pain maybe 50-60% better. She still has a nagging discomfort in her left upper quadrant and left mid quadrant which has not resolved. She is eating without difficulty has no nausea or vomiting but says that her discomfort is worse postprandially. Bowel movements have been normal no melena or hematochezia.  Review of Systems Pertinent positive and negative review of systems were noted in the above HPI section.  All other  review of systems was otherwise negative.  Outpatient Encounter Prescriptions as of 03/20/2015  Medication Sig  . albuterol (VENTOLIN HFA) 108 (90 BASE) MCG/ACT inhaler Inhale 2 puffs into the lungs every 6 (six) hours as needed. Shortness of breath  . amLODipine (NORVASC) 10 MG tablet Take 1 tablet (10 mg total) by mouth daily.  [name] aspirin EC 81 MG tablet Take 81 mg by mouth daily.  [name] atorvastatin (LIPITOR) 80 MG tablet Take 1 tablet (80 mg total) by mouth at bedtime.  . furosemide (LASIX) 20 MG tablet Take 2 tablets (40 mg total) by mouth daily.  [name] gabapentin (NEURONTIN) 300 MG capsule Take 1 capsule (300 mg total) by mouth at bedtime.  [name] glucose blood (ONE TOUCH ULTRA TEST) test strip Use as instructed by doctor up to 3x daily,dx code 250.02 insulin requiring  . HYDROcodone-acetaminophen (NORCO/VICODIN) 5-325 MG per tablet   . insulin glargine (LANTUS) 100 UNIT/ML injection Inject 0.4 mLs (40 Units total) into the skin daily with breakfast.  . insulin lispro (HUMALOG KWIKPEN) 100 UNIT/ML KiwkPen Inject 5 units with breakfast and 5 units with dinner, hold if CBGs low or not eating and call MD  . lisinopril (PRINIVIL,ZESTRIL) 20 MG tablet Take 1 tablet (20 mg total) by mouth daily.  . metoprolol succinate (TOPROL-XL) 50 MG 24 hr tablet Take 1 tablet (50 mg total) by mouth daily. Take with or immediately following a meal.  . nitroGLYCERIN (NITROSTAT) 0.4 MG SL tablet Place 0.4 mg under the tongue every 5 (five) minutes as needed for chest pain.  [name]  omeprazole (PRILOSEC) 20 MG capsule Take one tablet daily 30 minutes before breakfast  . metFORMIN (GLUCOPHAGE XR) 500 MG 24 hr tablet Take 1 tablet (500 mg total) by mouth daily with breakfast. (Patient not taking: Reported on 03/12/2015)  . polyethylene glycol powder (GLYCOLAX/MIRALAX) powder Take 255 g by mouth once.   No facility-administered encounter medications on file as of 03/20/2015.   Allergies  Allergen Reactions  . Actos [Pioglitazone]      REACTION: congestive heart failure ACTOS  . Avandia [Rosiglitazone Maleate]     unknown  . Hydrocortisone     unknown   Patient Active Problem List   Diagnosis Date Noted  . Abnormal CT of the abdomen 02/09/2015  . CAP (community acquired pneumonia) 08/08/2014  . Diabetic gastroparesis associated with type 2 diabetes mellitus 07/12/2014  . Depression 05/22/2014  . R thigh pedunculated lesion 04/08/2014  . Implantable cardioverter-defibrillator (ICD) generator end of life   . Health care maintenance 01/22/2013  . Nonischemic cardiomyopathy 04/03/2012  . Type 2 diabetes mellitus with stage 2 chronic kidney disease 04/03/2012  . DOE (dyspnea on exertion) 10/19/2011  . ALOPECIA 01/13/2010  . TOBACCO ABUSE 10/26/2009  . HYPERTHYROIDISM, SUBCLINICAL 05/06/2009  . POSTMENOPAUSAL SYNDROME 04/27/2009  . HYSTEROSCOPY, HX OF 04/27/2009  . SLEEP APNEA 08/20/2008  . Body mass index (BMI) of 45.0-49.9 in adult 02/11/2008  . ECZEMA 11/26/2007  . LIPOMA 08/01/2007  . Hyperlipidemia associated with type 2 diabetes mellitus 05/25/2007  . HYPERTENSION, BENIGN ESSENTIAL 05/25/2007  . Chronic combined systolic and diastolic heart failure 123456  . Automatic implantable cardioverter-defibrillator in situ 05/04/2007   History   Social History  . Marital Status: Widowed    Spouse Name: Alroy Dust  . Number of Children: 3  . Years of Education: 11   Occupational History  . disabled    Social History Main Topics  . Smoking status: Heavy Tobacco Smoker -- 0.50 packs/day for 35 years    Types: Cigarettes  . Smokeless tobacco: Never Used     Comment: currently smoking 3-4 cigs daily or less.  . Alcohol Use: 0.6 oz/week    1 Glasses of wine per week     Comment: Occassionally  . Drug Use: No  . Sexual Activity: Not on file   Other Topics Concern  . Not on file   Social History Narrative   ** Merged History Encounter **       Married    Ms. Briney's family history includes Asthma in  her maternal aunt and maternal uncle; Diabetes in her sister; Heart disease in her father, maternal aunt, maternal grandfather, maternal uncle, paternal aunt, and paternal uncle; Seizures in her father; Stroke in her mother. There is no history of Colon cancer, Colon polyps, Esophageal cancer, Kidney disease, or Gallbladder disease.      Objective:    Filed Vitals:   03/20/15 1025  BP: 124/78  Pulse: 60    Physical Exam  well-developed African American female in no acute distress, pleasant blood pressure 124/78 pulse 60 height 5 foot weight 232, BMI 45. HEENT; nontraumatic normocephalic EOMI PERRLA sclera anicteric, Supple; no JVD, Cardiovascular;regular rate and rhythm with [id number] soft systolic murmur, Pulmonary ;clear bilaterally, Abdomen; large soft is tender in the left upper and left mid quadrant is no guarding or rebound no palpable mass or hepatosplenomegaly bowel sounds are present, Rectal; exam not done, Extremities; no clubbing cyanosis or edema skin warm and dry, Psych ;mood and affect appropriate       Assessment &  Plan:   #1 64 yo female with persistent left upper and left mid quadrant abdominal pain x 2 months.  Most recent Ct unremarkable, some improvement with PPI- etiology of her pain is not clear #2 abnormal CT  01/2015 ? Pancreatitis-follow up Ct 02/2015 normal  #2 NICCM- ef 35-40 % s/p ICD placement #3 CHF #4 nocturnal  O 2  Use  #5 morbid obesity #6 IDDM   plan; Continue  Prilosec 20 mg po daily  Schedule for EGD and Colonoscopy with Dr. Ardis Hughs at [facility] . Procedures discussed in detail with pt and she is agreeable to proceed.        Delma Drone Genia Harold PA-C 03/20/2015   Cc: Wilber Oliphant, MD

## 2015-03-26 ENCOUNTER — Other Ambulatory Visit: Payer: Self-pay | Admitting: *Deleted

## 2015-03-26 ENCOUNTER — Other Ambulatory Visit: Payer: Self-pay | Admitting: Internal Medicine

## 2015-03-26 DIAGNOSIS — I5042 Chronic combined systolic (congestive) and diastolic (congestive) heart failure: Secondary | ICD-10-CM

## 2015-03-26 MED ORDER — FUROSEMIDE 20 MG PO TABS: 40.0000 mg | ORAL_TABLET | Freq: Every day | ORAL | Status: DC

## 2015-03-26 MED ORDER — LISINOPRIL 20 MG PO TABS: 20.0000 mg | ORAL_TABLET | Freq: Every day | ORAL | Status: DC

## 2015-03-26 NOTE — Telephone Encounter (Signed)
Rx written for lasix 20, take 2 daily but # 30 ordered.  Can you correct and resend?

## 2015-03-26 NOTE — Telephone Encounter (Signed)
Rx faxed in.

## 2015-03-27 ENCOUNTER — Ambulatory Visit (INDEPENDENT_AMBULATORY_CARE_PROVIDER_SITE_OTHER): Payer: Medicare Other | Admitting: Cardiology

## 2015-03-27 ENCOUNTER — Encounter: Payer: Self-pay | Admitting: Cardiology

## 2015-03-27 VITALS — BP 142/80 | HR 86 | Ht 60.0 in | Wt 230.4 lb

## 2015-03-27 DIAGNOSIS — R0602 Shortness of breath: Secondary | ICD-10-CM | POA: Diagnosis not present

## 2015-03-27 DIAGNOSIS — Z79899 Other long term (current) drug therapy: Secondary | ICD-10-CM

## 2015-03-27 NOTE — Patient Instructions (Addendum)
Your physician recommends that you schedule a follow-up appointment in: 6 Months with Dr Stanford Breed or sooner if needed  Your physician has recommended you make the following change in your medication: Increase Lasix to 40 mg twice a day for 2 days then back to 40 mg daily  Your physician recommends that you return for lab work BNP, BMP

## 2015-03-27 NOTE — Progress Notes (Signed)
03/27/2015 [name]   [birth year]  [id number]  Primary Physician: Jerene Pitch, MD Primary Cardiologist: Dr. Stanford Breed Electrophysiologist: Dr. Lovena Le  Reason for Visit/CC: F/U for Chronic Systolic CHF  HPI:  The patient is a 64 y/o female, followed by Dr. Stanford Breed. Her PMH is significant for chronic systolic CHF, NICM, HTN, HLD, T2DM and COPD. She is on home O2. She is s/p AICD implantation, which is a [employer] single chamber device. This was implanted for low EF and is followed by Dr. Lovena Le. Her most recent ischemic evaluation was a [facility] 6/13 which showed mild calcification in the LM, o/w normal coronary arteries, EF 45%. Last echocardiogram in June of 2015 showed ejection fraction of 35-40%.  She presents to clinic today for f/u of her chronic systolic HF. She notes that she has done fairly well, although she admits to mild DOE and 3 pillow orthopnea (2 pillow baseline). Also with mild occasional bilateral LEE. She also has mild resting dyspnea but no chest pain. She reports full medication compliance and adherance to a low sodium diet. She denies any ICD shocks. No syncope/ near syncope. She states her oxygen machine at home is broken.   Current Outpatient Prescriptions  Medication Sig Dispense Refill  . albuterol (VENTOLIN HFA) 108 (90 BASE) MCG/ACT inhaler Inhale 2 puffs into the lungs every 6 (six) hours as needed. Shortness of breath 1 Inhaler 6  . amLODipine (NORVASC) 10 MG tablet Take 1 tablet (10 mg total) by mouth daily. 30 tablet 3  . aspirin EC 81 MG tablet Take 81 mg by mouth daily.    [name] atorvastatin (LIPITOR) 80 MG tablet Take 1 tablet (80 mg total) by mouth at bedtime. 90 tablet 3  . furosemide (LASIX) 20 MG tablet Take 2 tablets (40 mg total) by mouth daily. 60 tablet 3  . gabapentin (NEURONTIN) 300 MG capsule Take 1 capsule (300 mg total) by mouth at bedtime. 30 capsule 11  . glucose blood (ONE TOUCH ULTRA TEST) test strip Use as instructed by doctor up to 3x  daily,dx code 250.02 insulin requiring 100 each 5  . HYDROcodone-acetaminophen (NORCO/VICODIN) 5-325 MG per tablet Take 1 tablet by mouth at bedtime as needed for moderate pain.   0  . insulin glargine (LANTUS) 100 UNIT/ML injection Inject 0.4 mLs (40 Units total) into the skin daily with breakfast. 20 mL 3  . insulin lispro (HUMALOG KWIKPEN) 100 UNIT/ML KiwkPen Inject 5 units with breakfast and 5 units with dinner, hold if CBGs low or not eating and call MD 15 mL 3  . lisinopril (PRINIVIL,ZESTRIL) 20 MG tablet Take 1 tablet (20 mg total) by mouth daily. 30 tablet 1  . metFORMIN (GLUCOPHAGE XR) 500 MG 24 hr tablet Take 1 tablet (500 mg total) by mouth daily with breakfast. 90 tablet 3  . metoprolol succinate (TOPROL-XL) 50 MG 24 hr tablet Take 1 tablet (50 mg total) by mouth daily. Take with or immediately following a meal. 30 tablet 3  . nitroGLYCERIN (NITROSTAT) 0.4 MG SL tablet Place 0.4 mg under the tongue every 5 (five) minutes as needed for chest pain.    [name] omeprazole (PRILOSEC) 20 MG capsule Take one tablet daily 30 minutes before breakfast 30 capsule 2  . polyethylene glycol powder (GLYCOLAX/MIRALAX) powder Take 255 g by mouth once. 255 g 0   No current facility-administered medications for this visit.    Allergies  Allergen Reactions  . Actos [Pioglitazone]     REACTION: congestive heart failure ACTOS  .  Avandia [Rosiglitazone Maleate]     unknown  . Hydrocortisone     unknown    History   Social History  . Marital Status: Widowed    Spouse Name: Alroy Dust  . Number of Children: 3  . Years of Education: 11   Occupational History  . disabled    Social History Main Topics  . Smoking status: Heavy Tobacco Smoker -- 0.50 packs/day for 35 years    Types: Cigarettes  . Smokeless tobacco: Never Used     Comment: currently smoking 3-4 cigs daily or less.  . Alcohol Use: 0.6 oz/week    1 Glasses of wine per week     Comment: Occassionally  . Drug Use: No  . Sexual  Activity: Not on file   Other Topics Concern  . Not on file   Social History Narrative   ** Merged History Encounter **       Married     Review of Systems: General: negative for chills, fever, night sweats or weight changes.  Cardiovascular: negative for chest pain, dyspnea on exertion, edema, orthopnea, palpitations, paroxysmal nocturnal dyspnea or shortness of breath Dermatological: negative for rash Respiratory: negative for cough or wheezing Urologic: negative for hematuria Abdominal: negative for nausea, vomiting, diarrhea, bright red blood per rectum, melena, or hematemesis Neurologic: negative for visual changes, syncope, or dizziness All other systems reviewed and are otherwise negative except as noted above.    Blood pressure 142/80, pulse 86, height 5' (1.524 m), weight 230 lb 6.4 oz (104.509 kg).  General appearance: alert, cooperative and no distress Neck: no adenopathy, no carotid bruit, no JVD and supple, symmetrical, trachea midline Lungs: decreased BS bilaterally Heart: regular rate and rhythm, S1, S2 normal, no murmur, click, rub or gallop Extremities: trace LEE Pulses: 2+ and symmetric Skin: warm and dry Neurologic: Grossly normal  EKG NSR. 86 bpm. She has mild lateral twave abnormalities, unchanged from prior   ASSESSMENT AND PLAN:   1. Chronic Systolic CHF: with mild DOE and 3 pillow orthopnea (2 pillow baseline). She notes her home O2 machine hasn't been working. O2 sats are 87 % on RA resting and 77% with ambulation. She has trace LEE but no JVD and lungs are CTAB. ? If increased dyspnea is 2/2 CHF vs worsening COPD. Will check a BNP today to help differentiate. For now we will have her temporarily increase her home lasix dose to 40 mg BID to see if symptoms improve. Continue low sodium diet.   2. NICM: LHC 03/2012 showed mild calcification in the LM, o/w normal coronary arteries, EF 45%. Continue BB and ACE-I. Has ICD. No CP.   3. HTN: controlled on  current regimen   4. HLD: Last FLP 07/2014 showed well controlled LDL at 60 mg/dL. Continue statin therapy. Repeat FLP in 6 months.  5. DM: poorly controlled. Hgb A1c 10/2014 was 11.2! Continue current meds and f/u with PCP.   6. ICD: St. Jude single chamber. Followed by Dr. Lovena Le.   7. Hypoxia: SATURATION QUALIFICATIONS: (This note is used to comply with regulatory documentation for home oxygen)  Patient Saturations on Room Air at Rest = 87%  Patient Saturations on Room Air while Ambulating = 77%  Patient Saturations on 2 Liters of oxygen while Ambulating = 95%  Please briefly explain why patient needs home oxygen: hypoxia on RA at rest and with exertion. If BNP is normal, will have her f/u with her pulmonologist.    PLAN  Patient prescribed 2L [place] given  hypoxia. O2 stats remained stable at 95% on 2L [place]. Will check a BNP to help differentiate CHF vs COPD as the cause of her dyspnea/ hypoxia. Temporarily increase lasix to 40 mg BID x 2 days. Will f/u on labs. F/u with Dr. Stanford Breed.   Tyneka Scafidi PA-C 03/27/2015 9:03 AM

## 2015-03-28 ENCOUNTER — Other Ambulatory Visit: Payer: Self-pay | Admitting: Internal Medicine

## 2015-03-30 DIAGNOSIS — J449 Chronic obstructive pulmonary disease, unspecified: Secondary | ICD-10-CM | POA: Diagnosis not present

## 2015-04-08 ENCOUNTER — Encounter (HOSPITAL_COMMUNITY): Payer: Self-pay | Admitting: Emergency Medicine

## 2015-04-08 ENCOUNTER — Emergency Department (HOSPITAL_COMMUNITY): Payer: Medicare Other

## 2015-04-08 ENCOUNTER — Emergency Department (HOSPITAL_COMMUNITY)
Admission: EM | Admit: 2015-04-08 | Discharge: 2015-04-09 | Disposition: A | Discharge status: Home / Self Care | Payer: Medicare Other | Attending: Emergency Medicine | Admitting: Emergency Medicine

## 2015-04-08 DIAGNOSIS — Z7982 Long term (current) use of aspirin: Secondary | ICD-10-CM | POA: Insufficient documentation

## 2015-04-08 DIAGNOSIS — Z95810 Presence of automatic (implantable) cardiac defibrillator: Secondary | ICD-10-CM | POA: Insufficient documentation

## 2015-04-08 DIAGNOSIS — Z8659 Personal history of other mental and behavioral disorders: Secondary | ICD-10-CM | POA: Diagnosis not present

## 2015-04-08 DIAGNOSIS — J069 Acute upper respiratory infection, unspecified: Secondary | ICD-10-CM | POA: Diagnosis not present

## 2015-04-08 DIAGNOSIS — Z9889 Other specified postprocedural states: Secondary | ICD-10-CM | POA: Diagnosis not present

## 2015-04-08 DIAGNOSIS — Z79899 Other long term (current) drug therapy: Secondary | ICD-10-CM | POA: Diagnosis not present

## 2015-04-08 DIAGNOSIS — Z794 Long term (current) use of insulin: Secondary | ICD-10-CM | POA: Insufficient documentation

## 2015-04-08 DIAGNOSIS — Z888 Allergy status to other drugs, medicaments and biological substances status: Secondary | ICD-10-CM | POA: Diagnosis not present

## 2015-04-08 DIAGNOSIS — I10 Essential (primary) hypertension: Secondary | ICD-10-CM | POA: Diagnosis not present

## 2015-04-08 DIAGNOSIS — G4730 Sleep apnea, unspecified: Secondary | ICD-10-CM | POA: Diagnosis not present

## 2015-04-08 DIAGNOSIS — Z8742 Personal history of other diseases of the female genital tract: Secondary | ICD-10-CM | POA: Diagnosis not present

## 2015-04-08 DIAGNOSIS — Z720 Tobacco use: Secondary | ICD-10-CM | POA: Insufficient documentation

## 2015-04-08 DIAGNOSIS — I5042 Chronic combined systolic (congestive) and diastolic (congestive) heart failure: Secondary | ICD-10-CM | POA: Insufficient documentation

## 2015-04-08 DIAGNOSIS — E669 Obesity, unspecified: Secondary | ICD-10-CM | POA: Insufficient documentation

## 2015-04-08 DIAGNOSIS — F17210 Nicotine dependence, cigarettes, uncomplicated: Secondary | ICD-10-CM | POA: Diagnosis not present

## 2015-04-08 DIAGNOSIS — E119 Type 2 diabetes mellitus without complications: Secondary | ICD-10-CM | POA: Insufficient documentation

## 2015-04-08 DIAGNOSIS — J441 Chronic obstructive pulmonary disease with (acute) exacerbation: Secondary | ICD-10-CM | POA: Diagnosis not present

## 2015-04-08 DIAGNOSIS — Z872 Personal history of diseases of the skin and subcutaneous tissue: Secondary | ICD-10-CM | POA: Insufficient documentation

## 2015-04-08 DIAGNOSIS — I429 Cardiomyopathy, unspecified: Secondary | ICD-10-CM | POA: Diagnosis not present

## 2015-04-08 DIAGNOSIS — Z86010 Personal history of colonic polyps: Secondary | ICD-10-CM | POA: Diagnosis not present

## 2015-04-08 DIAGNOSIS — R112 Nausea with vomiting, unspecified: Secondary | ICD-10-CM | POA: Diagnosis not present

## 2015-04-08 DIAGNOSIS — Z9981 Dependence on supplemental oxygen: Secondary | ICD-10-CM | POA: Diagnosis not present

## 2015-04-08 DIAGNOSIS — Z86018 Personal history of other benign neoplasm: Secondary | ICD-10-CM | POA: Insufficient documentation

## 2015-04-08 DIAGNOSIS — E785 Hyperlipidemia, unspecified: Secondary | ICD-10-CM | POA: Insufficient documentation

## 2015-04-08 DIAGNOSIS — F329 Major depressive disorder, single episode, unspecified: Secondary | ICD-10-CM | POA: Diagnosis not present

## 2015-04-08 DIAGNOSIS — R0602 Shortness of breath: Secondary | ICD-10-CM | POA: Diagnosis not present

## 2015-04-08 DIAGNOSIS — R05 Cough: Secondary | ICD-10-CM | POA: Diagnosis not present

## 2015-04-08 DIAGNOSIS — R1032 Left lower quadrant pain: Secondary | ICD-10-CM | POA: Diagnosis present

## 2015-04-08 DIAGNOSIS — E780 Pure hypercholesterolemia: Secondary | ICD-10-CM | POA: Diagnosis not present

## 2015-04-08 DIAGNOSIS — R079 Chest pain, unspecified: Secondary | ICD-10-CM | POA: Diagnosis not present

## 2015-04-08 DIAGNOSIS — K2950 Unspecified chronic gastritis without bleeding: Secondary | ICD-10-CM | POA: Diagnosis not present

## 2015-04-08 DIAGNOSIS — I509 Heart failure, unspecified: Secondary | ICD-10-CM | POA: Diagnosis not present

## 2015-04-08 DIAGNOSIS — Z6841 Body Mass Index (BMI) 40.0 and over, adult: Secondary | ICD-10-CM | POA: Diagnosis not present

## 2015-04-08 LAB — BASIC METABOLIC PANEL
Anion gap: 14 (ref 5–15)
BUN: 8 mg/dL (ref 6–20)
CO2: 26 mmol/L (ref 22–32)
Calcium: 9.4 mg/dL (ref 8.9–10.3)
Chloride: 97 mmol/L — ABNORMAL LOW (ref 101–111)
Creatinine, Ser: 0.84 mg/dL (ref 0.44–1.00)
GFR calc Af Amer: >60 mL/min (ref >60)
GFR calc non Af Amer: >60 mL/min (ref >60)
Glucose, Bld: 203 mg/dL — ABNORMAL HIGH (ref 65–99)
Potassium: 3.7 mmol/L (ref 3.5–5.1)
Sodium: 137 mmol/L (ref 135–145)

## 2015-04-08 LAB — CBC
HCT: 46.4 % — ABNORMAL HIGH (ref 36.0–46.0)
Hemoglobin: 15.5 g/dL — ABNORMAL HIGH (ref 12.0–15.0)
MCH: 27.4 pg (ref 26.0–34.0)
MCHC: 33.4 g/dL (ref 30.0–36.0)
MCV: 82 fL (ref 78.0–100.0)
Platelets: 232 10*3/uL (ref 150–400)
RBC: 5.66 MIL/uL — ABNORMAL HIGH (ref 3.87–5.11)
RDW: 15.3 % (ref 11.5–15.5)
WBC: 9.1 10*3/uL (ref 4.0–10.5)

## 2015-04-08 LAB — I-STAT TROPONIN, ED: Troponin i, poc: 0.03 ng/mL (ref 0.00–0.08)

## 2015-04-08 LAB — BRAIN NATRIURETIC PEPTIDE: B Natriuretic Peptide: 171.7 pg/mL — ABNORMAL HIGH (ref 0.0–100.0)

## 2015-04-08 MED ORDER — ONDANSETRON 4 MG PO TBDP
ORAL_TABLET | ORAL | Status: AC
  Filled 2015-04-08: qty 2

## 2015-04-08 MED ORDER — ONDANSETRON 4 MG PO TBDP
8.0000 mg | ORAL_TABLET | Freq: Once | ORAL | Status: AC
  Administered 2015-04-08: 8 mg via ORAL

## 2015-04-08 NOTE — ED Notes (Signed)
Pt reports sob  Cough and nv since Monday. Denies cp.

## 2015-04-08 NOTE — ED Provider Notes (Signed)
CSN: [id number]     Arrival date & time 04/08/15  2004 History   First MD Initiated Contact with Patient 04/08/15 2249     Chief Complaint  Patient presents with  . Shortness of Breath  . Cough     (Consider location/radiation/quality/duration/timing/severity/associated sxs/prior Treatment) Patient is a 64 y.o. female presenting with shortness of breath and cough. The history is provided by the patient.  Shortness of Breath Severity:  Moderate Associated symptoms: chest pain, cough and vomiting   Associated symptoms: no abdominal pain, no headaches and no rash   Cough Associated symptoms: chest pain and shortness of breath   Associated symptoms: no headaches and no rash    patient was shortness of breath and cough for the last few days. Some cough with mild sputum production. No fevers. She's also had some nausea. Does have a history of heart failure and has a defibrillator in place. Some mild abdominal pain. No diarrhea. No sick contacts. Occasional dull chest pain.  Past Medical History  Diagnosis Date  . Hyperthyroidism, subclinical   . Post menopausal syndrome   . Obesity   . Cardiac defibrillator in situ     Atlas II VR (SJM) implanted by Dr Lovena Le  . Eczema   . Lipoma   . Chronic ulcer of leg   . Hyperlipidemia   . Chronic combined systolic and diastolic heart failure     a. EF 35-40% in past;  b. Echo 7/13:  EF 45-50%, Gr 2 diast dysfn, mild AI, mild MAC, trivial MR, mild LAE, PASP 47.  [name] NICM (nonischemic cardiomyopathy)   . Diabetes mellitus   . HTN (hypertension)   . Elevated alkaline phosphatase level     GGT and 5'nucleotidase 8/13 normal  . Hx of cardiac cath     a. [place] 2003 normal;  b. LHC 6/13:  Mild calcification in the LM, o/w normal coronary arteries, EF 45%.   . Sleep apnea     pt denies 04/12/2013  . COPD (chronic obstructive pulmonary disease)     O2 at night  . Automatic implantable cardioverter-defibrillator in situ   . CHF (congestive heart failure)    . Depression   . Implantable cardioverter-defibrillator (ICD) generator end of life   . ATN (acute tubular necrosis) 07/15/2014  . Colon polyps 04/12/2013    Rectosigmoid polyp   Past Surgical History  Procedure Laterality Date  . Cardiac defibrillator placement  05/04/2007    SJM Atlas II VR ICD  . Hysteroscopy    . Cardiac defibrillator placement    . Abdominal hysterectomy    . Cardiac catheterization    . Insert / replace / remove pacemaker    . Tubal ligation    . Hernia repair    . Colonoscopy N/A 04/12/2013    Procedure: COLONOSCOPY;  Surgeon: Beryle Beams, MD;  Location: [facility] ENDOSCOPY;  Service: Endoscopy;  Laterality: N/A;  pt.has defibrilator  . Left heart catheterization with coronary angiogram N/A 04/02/2012    Procedure: LEFT HEART CATHETERIZATION WITH CORONARY ANGIOGRAM;  Surgeon: Hillary Bow, MD;  Location: [facility] CATH LAB;  Service: Cardiovascular;  Laterality: N/A;  . Implantable cardioverter defibrillator (icd) generator change N/A 04/02/2014    Procedure: ICD GENERATOR CHANGE;  Surgeon: Evans Lance, MD;  Location: [facility] CATH LAB;  Service: Cardiovascular;  Laterality: N/A;   Family History  Problem Relation Age of Onset  . Stroke Mother   . Seizures Father   . Diabetes Sister   . Asthma  Maternal Aunt     aunts  . Asthma Maternal Uncle     uncles  . Heart disease Father   . Heart disease Paternal Aunt     aunts  . Heart disease Paternal Uncle     uncles  . Heart disease Maternal Aunt     aunts  . Heart disease Maternal Uncle     uncles  . Heart disease Maternal Grandfather   . Colon cancer Neg Hx   . Colon polyps Neg Hx   . Esophageal cancer Neg Hx   . Kidney disease Neg Hx   . Gallbladder disease Neg Hx    History  Substance Use Topics  . Smoking status: Heavy Tobacco Smoker -- 0.50 packs/day for 35 years    Types: Cigarettes  . Smokeless tobacco: Never Used     Comment: currently smoking 3-4 cigs daily or less.  . Alcohol Use: 0.6 oz/week     1 Glasses of wine per week     Comment: Occassionally   OB History    No data available     Review of Systems  Constitutional: Negative for activity change and appetite change.  Eyes: Negative for pain.  Respiratory: Positive for cough and shortness of breath. Negative for chest tightness.   Cardiovascular: Positive for chest pain. Negative for leg swelling.  Gastrointestinal: Positive for nausea and vomiting. Negative for abdominal pain and diarrhea.  Genitourinary: Negative for flank pain.  Musculoskeletal: Negative for back pain and neck stiffness.  Skin: Negative for rash.  Neurological: Negative for weakness, numbness and headaches.  Psychiatric/Behavioral: Negative for behavioral problems.      Allergies  Actos; Avandia; and Hydrocortisone  Home Medications   Prior to Admission medications   Medication Sig Start Date End Date Taking? Authorizing Provider  albuterol (VENTOLIN HFA) 108 (90 BASE) MCG/ACT inhaler Inhale 2 puffs into the lungs every 6 (six) hours as needed. Shortness of breath 07/09/13   Wilber Oliphant, MD  amLODipine (NORVASC) 10 MG tablet TAKE 1 TABLET BY MOUTH DAILY 03/30/15   Sid Falcon, MD  aspirin EC 81 MG tablet Take 81 mg by mouth daily.    Historical Provider, MD  atorvastatin (LIPITOR) 80 MG tablet Take 1 tablet (80 mg total) by mouth at bedtime. 01/14/15   Lucious Groves, DO  furosemide (LASIX) 20 MG tablet Take 2 tablets (40 mg total) by mouth daily. 03/26/15   Sid Falcon, MD  gabapentin (NEURONTIN) 300 MG capsule Take 1 capsule (300 mg total) by mouth at bedtime. 07/04/14   Nischal Narendra, MD  glucose blood (ONE TOUCH ULTRA TEST) test strip Use as instructed by doctor up to 3x daily,dx code 250.02 insulin requiring 05/20/13   Wilber Oliphant, MD  HYDROcodone-acetaminophen (NORCO/VICODIN) 5-325 MG per tablet Take 1 tablet by mouth at bedtime as needed for moderate pain.  01/28/15   Historical Provider, MD  insulin glargine (LANTUS) 100 UNIT/ML  injection Inject 0.4 mLs (40 Units total) into the skin daily with breakfast. 02/09/15   Wilber Oliphant, MD  insulin lispro (HUMALOG KWIKPEN) 100 UNIT/ML KiwkPen Inject 5 units with breakfast and 5 units with dinner, hold if CBGs low or not eating and call MD 02/09/15   Wilber Oliphant, MD  lisinopril (PRINIVIL,ZESTRIL) 20 MG tablet TAKE 1 TABLET(20 MG) BY MOUTH DAILY 03/27/15   Sid Falcon, MD  metFORMIN (GLUCOPHAGE XR) 500 MG 24 hr tablet Take 1 tablet (500 mg total) by mouth daily with breakfast. 01/14/15  01/14/16  Lucious Groves, DO  metoprolol succinate (TOPROL-XL) 50 MG 24 hr tablet Take 1 tablet (50 mg total) by mouth daily. Take with or immediately following a meal. 08/14/14   Wilber Oliphant, MD  nitroGLYCERIN (NITROSTAT) 0.4 MG SL tablet Place 0.4 mg under the tongue every 5 (five) minutes as needed for chest pain.    Historical Provider, MD  omeprazole (PRILOSEC) 20 MG capsule Take one tablet daily 30 minutes before breakfast 03/03/15   Amy S Esterwood, PA-C  polyethylene glycol powder (GLYCOLAX/MIRALAX) powder Take 255 g by mouth once. 03/20/15   Amy S Esterwood, PA-C   BP 190/88 mmHg  Pulse 111  Temp(Src) 98.7 F (37.1 C) (Oral)  Resp 18  Ht 5' (1.524 m)  Wt 224 lb 3.2 oz (101.696 kg)  BMI 43.79 kg/m2  SpO2 94% Physical Exam  Constitutional: She is oriented to person, place, and time. She appears well-developed and well-nourished.  HENT:  Head: Normocephalic and atraumatic.  Eyes: EOM are normal. Pupils are equal, round, and reactive to light.  Neck: Normal range of motion. Neck supple.  Cardiovascular: Normal rate, regular rhythm and normal heart sounds.   No murmur heard. Pulmonary/Chest: Effort normal. No respiratory distress. She has no wheezes. She has rales.  Few scattered rales  Abdominal: Soft. Bowel sounds are normal. She exhibits no distension. There is tenderness. There is no rebound.  Slight upper abdominal tenderness without rebound or guarding.  Musculoskeletal:  Normal range of motion. She exhibits no edema.  Neurological: She is alert and oriented to person, place, and time. No cranial nerve deficit.  Skin: Skin is warm and dry.  Psychiatric: She has a normal mood and affect. Her speech is normal.  Nursing note and vitals reviewed.   ED Course  Procedures (including critical care time) Labs Review Labs Reviewed  CBC - Abnormal; Notable for the following:    RBC 5.66 (*)    Hemoglobin 15.5 (*)    HCT 46.4 (*)    All other components within normal limits  BASIC METABOLIC PANEL - Abnormal; Notable for the following:    Chloride 97 (*)    Glucose, Bld 203 (*)    All other components within normal limits  BRAIN NATRIURETIC PEPTIDE - Abnormal; Notable for the following:    B Natriuretic Peptide 171.7 (*)    All other components within normal limits  I-STAT TROPOININ, ED    Imaging Review Dg Chest 2 View  04/08/2015   CLINICAL DATA:  Shortness of breath and cough for 2 days  EXAM: CHEST  2 VIEW  COMPARISON:  10/15/2014  FINDINGS: There is a single chamber ICD/pacer from the left. Chronic mild cardiomegaly, accentuated by lower lung volumes. Negative aortic and hilar contours. There is no edema, consolidation, effusion, or pneumothorax. No acute osseous findings.  IMPRESSION: No change from prior to suggest acute disease.   Electronically Signed   By: Monte Fantasia M.D.   On: 04/08/2015 23:44     EKG Interpretation   Date/Time:  Wednesday April 08 2015 22:44:21 EDT Ventricular Rate:  111 PR Interval:  153 QRS Duration: 87 QT Interval:  376 QTC Calculation: 511 R Axis:   6 Text Interpretation:  Sinus arrhythmia Ventricular premature complex  Consider right atrial enlargement Nonspecific T abnormalities, lateral  leads Prolonged QT interval No significant change since last tracing  Confirmed by Alvino Chapel  MD, Ovid Curd [phone]) on 04/08/2015 10:57:49 PM      MDM   Final diagnoses:  URI (  upper respiratory infection)    Patient with  cough and some shortness of breath. Has had some sputum production and sounds more like a URI as opposed to a CHF or pneumonia. Doubt cardiac cause. A&P is minimally elevated but hemoglobin is up and weight is slightly up. Mild tachycardia but overall not very dyspneic. Will discharge home to follow-up with PCP as needed.    Davonna Belling, MD 04/09/15 [id number]

## 2015-04-09 ENCOUNTER — Telehealth: Payer: Self-pay | Admitting: Internal Medicine

## 2015-04-09 ENCOUNTER — Other Ambulatory Visit: Payer: Self-pay

## 2015-04-09 ENCOUNTER — Encounter (HOSPITAL_COMMUNITY): Payer: Self-pay | Admitting: *Deleted

## 2015-04-09 ENCOUNTER — Other Ambulatory Visit: Payer: Self-pay | Admitting: *Deleted

## 2015-04-09 ENCOUNTER — Telehealth: Payer: Self-pay | Admitting: *Deleted

## 2015-04-09 NOTE — Patient Outreach (Signed)
[name]) Care Management  04/09/2015  [name] 07-15-1951 [id number]  Care Coordination: Called member's daughter, [name], with member's permission to update regarding member's status. Daughter informed of member vital signs and appearance of being short of breath, although member denies feeling short of breath. [name] notified that primary care office was called-awaiting a return call. [name] is a nurse and is aware of member's emergency room visit on yesterday. [name] stated that she would be going to stop by member's house to check on her today. [name] is also aware that she will be receiving a follow up call from the primary care physician's office since member does not have a working phone. Care manager discussed [name] and informed that member does not want to continue with services or be transitioned to health coach for continued disease management. [name] informed that member can call and re-establish care if she changes her mind or needs change. Contact information provided.  Peggy Silversmith, RN, MSN, No Name Coordinator Cell: [phone]

## 2015-04-09 NOTE — Discharge Instructions (Signed)
Upper Respiratory Infection, Adult An upper respiratory infection (URI) is also sometimes known as the common cold. The upper respiratory tract includes the nose, sinuses, throat, trachea, and bronchi. Bronchi are the airways leading to the lungs. Most people improve within 1 week, but symptoms can last up to 2 weeks. A residual cough may last even longer.  CAUSES Many different viruses can infect the tissues lining the upper respiratory tract. The tissues become irritated and inflamed and often become very moist. Mucus production is also common. A cold is contagious. You can easily spread the virus to others by oral contact. This includes kissing, sharing a glass, coughing, or sneezing. Touching your mouth or nose and then touching a surface, which is then touched by another person, can also spread the virus. SYMPTOMS  Symptoms typically develop 1 to 3 days after you come in contact with a cold virus. Symptoms vary from person to person. They may include:  Runny nose.  Sneezing.  Nasal congestion.  Sinus irritation.  Sore throat.  Loss of voice (laryngitis).  Cough.  Fatigue.  Muscle aches.  Loss of appetite.  Headache.  Low-grade fever. DIAGNOSIS  You might diagnose your own cold based on familiar symptoms, since most people get a cold 2 to 3 times a year. Your caregiver can confirm this based on your exam. Most importantly, your caregiver can check that your symptoms are not due to another disease such as strep throat, sinusitis, pneumonia, asthma, or epiglottitis. Blood tests, throat tests, and X-rays are not necessary to diagnose a common cold, but they may sometimes be helpful in excluding other more serious diseases. Your caregiver will decide if any further tests are required. RISKS AND COMPLICATIONS  You may be at risk for a more severe case of the common cold if you smoke cigarettes, have chronic heart disease (such as heart failure) or lung disease (such as asthma), or if  you have a weakened immune system. The very young and very old are also at risk for more serious infections. Bacterial sinusitis, middle ear infections, and bacterial pneumonia can complicate the common cold. The common cold can worsen asthma and chronic obstructive pulmonary disease (COPD). Sometimes, these complications can require emergency medical care and may be life-threatening. PREVENTION  The best way to protect against getting a cold is to practice good hygiene. Avoid oral or hand contact with people with cold symptoms. Wash your hands often if contact occurs. There is no clear evidence that vitamin C, vitamin E, echinacea, or exercise reduces the chance of developing a cold. However, it is always recommended to get plenty of rest and practice good nutrition. TREATMENT  Treatment is directed at relieving symptoms. There is no cure. Antibiotics are not effective, because the infection is caused by a virus, not by bacteria. Treatment may include:  Increased fluid intake. Sports drinks offer valuable electrolytes, sugars, and fluids.  Breathing heated mist or steam (vaporizer or shower).  Eating chicken soup or other clear broths, and maintaining good nutrition.  Getting plenty of rest.  Using gargles or lozenges for comfort.  Controlling fevers with ibuprofen or acetaminophen as directed by your caregiver.  Increasing usage of your inhaler if you have asthma. Zinc gel and zinc lozenges, taken in the first 24 hours of the common cold, can shorten the duration and lessen the severity of symptoms. Pain medicines may help with fever, muscle aches, and throat pain. A variety of non-prescription medicines are available to treat congestion and runny nose. Your caregiver   can make recommendations and may suggest nasal or lung inhalers for other symptoms.  HOME CARE INSTRUCTIONS   Only take over-the-counter or prescription medicines for pain, discomfort, or fever as directed by your  caregiver.  Use a warm mist humidifier or inhale steam from a shower to increase air moisture. This may keep secretions moist and make it easier to breathe.  Drink enough water and fluids to keep your urine clear or pale yellow.  Rest as needed.  Return to work when your temperature has returned to normal or as your caregiver advises. You may need to stay home longer to avoid infecting others. You can also use a face mask and careful hand washing to prevent spread of the virus. SEEK MEDICAL CARE IF:   After the first few days, you feel you are getting worse rather than better.  You need your caregiver's advice about medicines to control symptoms.  You develop chills, worsening shortness of breath, or brown or red sputum. These may be signs of pneumonia.  You develop yellow or brown nasal discharge or pain in the face, especially when you bend forward. These may be signs of sinusitis.  You develop a fever, swollen neck glands, pain with swallowing, or white areas in the back of your throat. These may be signs of strep throat. SEEK IMMEDIATE MEDICAL CARE IF:   You have a fever.  You develop severe or persistent headache, ear pain, sinus pain, or chest pain.  You develop wheezing, a prolonged cough, cough up blood, or have a change in your usual mucus (if you have chronic lung disease).  You develop sore muscles or a stiff neck. Document Released: 03/15/2001 Document Revised: 12/12/2011 Document Reviewed: 12/25/2013 Exit[employer] Patient Information 2015 [employer]. This information is not intended to replace advice given to you by your health care provider. Make sure you discuss any questions you have with your health care provider.  

## 2015-04-09 NOTE — Telephone Encounter (Signed)
Calling to give assessment update to dr.

## 2015-04-09 NOTE — Patient Outreach (Addendum)
[facility]) Care Management  04/09/2015  LAREYNA FRAVEL 01-Jan-1951 [id number]   Received call from Primary care Office, spoke with Bonnita Nasuti, nurse-informed of member's emergency room visit on yesterday with diagnosis of upper respiratory infection; member's Blood pressure 160/98, heart rate 57, respirations 38 appeared short of breath(some mouth and abdominal breathing noted, although member denies feeling short of breath). Member reported using her albuterol inhaler once this weekend, stating it did not help; oxygen saturation 92-95 percent on room air. Informed that member reports loss of appetite and is not eating, although drinking Gatorade 2 glasses today. Blood sugar 207, weight today per member's scale 222.4 pounds. Concern that member does not look her normal with increased respirations, decreased appetite (history of diabetes and taking insulin). Member not checking or blood sugars or weights. White phlegm noted today. Member states is "peeing 4-5 times/day or more". Last bowel movement was Sunday. Has oxygen that she uses as needed. Informed that member's daughter Cathie Olden was called and updated on member's status and instructed to call Ms. Jeter for follow up as member's phone number is not working at this time. Member aware that daughter will be called for follow up. Also informed that member does not want further follow up/involvement with [facility] at this time. Instructed to re-refer at any time if member is agreeable.  Thea Silversmith, RN, MSN, [place] Coordinator Cell: [phone]

## 2015-04-09 NOTE — Telephone Encounter (Signed)
closed

## 2015-04-09 NOTE — Telephone Encounter (Signed)
[facility] RN calls and states that pt did not look well today. She was just in the ED 7/6. diag URI. Her temp was not checked. BP 160/98 HR 57 R38 o2 sat 92-95 wt 222.4 CBG 207 [facility] has been seeing pt for appr 3 months but pt told them today that she did not want them to come back and did not want health coaches calling. Pt states she has not been compliant with checking cbg's and checking wt Her phone has been disconnected If it is advised triage will call her daughter and have her bring her to urg care or ED for assessmenplease advise

## 2015-04-09 NOTE — Telephone Encounter (Signed)
Agree with appt. Overdue for A1C and last one was 9. PCP or [facility] res

## 2015-04-10 ENCOUNTER — Ambulatory Visit: Payer: Medicare Other | Admitting: Internal Medicine

## 2015-04-10 NOTE — Telephone Encounter (Signed)
Spoke [place], [facility] can go out to the home since pt dismissed their services, then called a # for a son and he stated he would call his sister and mother and give them the message to call [facility]c, he lives out of town. Will await call from pt or daughter

## 2015-04-10 NOTE — Telephone Encounter (Signed)
[name] is calling helen back .

## 2015-04-10 NOTE — Patient Outreach (Addendum)
[name]) Care Management  [name]  04/10/2015   [name] November 29, 1950 [id number]    Subjective: Patient states feeling "bit better" than yesterday. She reports being seen at the emergency room 7/6 for shortness of breath and coughing. Denies pain and shortness of breath at this time.  Objective: BP 160/98 mmHg  Pulse 57  Resp 38  Wt 224 lb (101.606 kg)  SpO2 95% at room air;  CBG=207 Patient appeared with shortness of breath during home visit. Noted increased respirations with use of abdominal muscles and mouth breathing at times. She has occasional coughing with whitish phlegm. Patient was noted with improved bilateral lower extremity non- pitting edema. Diminished lung sounds but no wheezing noted on auscultation.   Current Medications:  Current Outpatient Prescriptions  Medication Sig Dispense Refill  . albuterol (VENTOLIN HFA) 108 (90 BASE) MCG/ACT inhaler Inhale 2 puffs into the lungs every 6 (six) hours as needed. Shortness of breath 1 Inhaler 6  . amLODipine (NORVASC) 10 MG tablet TAKE 1 TABLET BY MOUTH DAILY 30 tablet 0  . aspirin EC 81 MG tablet Take 81 mg by mouth daily.    [name] atorvastatin (LIPITOR) 80 MG tablet Take 1 tablet (80 mg total) by mouth at bedtime. 90 tablet 3  . furosemide (LASIX) 20 MG tablet Take 2 tablets (40 mg total) by mouth daily. 60 tablet 3  . gabapentin (NEURONTIN) 300 MG capsule Take 1 capsule (300 mg total) by mouth at bedtime. 30 capsule 11  . glucose blood (ONE TOUCH ULTRA TEST) test strip Use as instructed by doctor up to 3x daily,dx code 250.02 insulin requiring 100 each 5  . HYDROcodone-acetaminophen (NORCO/VICODIN) 5-325 MG per tablet Take 1 tablet by mouth at bedtime as needed for moderate pain.   0  . insulin glargine (LANTUS) 100 UNIT/ML injection Inject 0.4 mLs (40 Units total) into the skin daily with breakfast. 20 mL 3  . insulin lispro (HUMALOG KWIKPEN) 100 UNIT/ML KiwkPen Inject 5 units with breakfast  and 5 units with dinner, hold if CBGs low or not eating and call MD 15 mL 3  . lisinopril (PRINIVIL,ZESTRIL) 20 MG tablet TAKE 1 TABLET(20 MG) BY MOUTH DAILY 90 tablet 1  . metoprolol succinate (TOPROL-XL) 50 MG 24 hr tablet Take 1 tablet (50 mg total) by mouth daily. Take with or immediately following a meal. 30 tablet 3  . nitroGLYCERIN (NITROSTAT) 0.4 MG SL tablet Place 0.4 mg under the tongue every 5 (five) minutes as needed for chest pain.    [name] omeprazole (PRILOSEC) 20 MG capsule Take one tablet daily 30 minutes before breakfast 30 capsule 2  . ondansetron (ZOFRAN-ODT) 8 MG disintegrating tablet Take 8 mg by mouth every 8 (eight) hours as needed for nausea or vomiting.    . polyethylene glycol powder (GLYCOLAX/MIRALAX) powder Take 255 g by mouth once. 255 g 0  . metFORMIN (GLUCOPHAGE XR) 500 MG 24 hr tablet Take 1 tablet (500 mg total) by mouth daily with breakfast. (Patient not taking: Reported on 04/09/2015) 90 tablet 3   No current facility-administered medications for this visit.    Functional Status:  In your present state of health, do you have any difficulty performing the following activities: 03/12/2015 02/13/2015  Hearing? N N  Vision? Y N  Difficulty concentrating or making decisions? N N  Walking or climbing stairs? N Y  Dressing or bathing? N N  Doing errands, shopping? N N  Preparing Food and eating ? - -  Using the Toilet? - -  In the past six months, have you accidently leaked urine? - -  Do you have problems with loss of bowel control? - -  Managing your Medications? - -  Managing your Finances? - -  Housekeeping or managing your Housekeeping? - -    Fall/Depression Screening: PHQ 2/9 Scores 04/09/2015 02/13/2015 02/09/2015 01/14/2015 01/08/2015 12/23/2014 10/23/2014  PHQ - 2 Score 0 0 0 0 0 0 0    Assessment:  64 year old female who was seen at the emergency room 04/08/15 due to shortness of breath, coughing and vomiting with discharge diagnosis of Upper Respiratory  Infection. Has history of heart failure, diabetes and hypertension. She appears short of breath at rest although patient denies it. Patient also verbalized not needing to go to the Emergency room or Urgent Care at this time. Patient uses home oxygen per nasal cannula as needed and reports it was recently serviced with oxygen sign placed on her door. Patient also uses Albuterol inhaler as needed. Noted occasional productive coughing with whitish expectorant.  Patient reports loss of appetite and not eating well. She states being able to tolerate drinking 2 glasses of Gatorade today and a glass yesterday. She has history of diabetes and taking insulin. Patient admitted to not checking her blood sugar or weights as she is suppose to. She also mentioned not having bowel movement since Sunday (on Miralax) but voiding 4-5 times per day. Patient also admitted drinking a glass of wine last week.  Medications reviewed and patient has been managing her own medications. She is out of Metoprolol but states it will be refilled today. Patient states her daughter [name]) will drop by to check on her today.  Patient informed care management coordinator that she does not want further follow-up with our services including the transition to health coach for further disease management. Discussed with patient about being able to call [name] care coordinator if she changes her mind or has any additional care management needs.  She was also informed to call 24 hour nurse line as needed.  Patient was instructed to seek urgent provider assistance if condition worsens.   Plan: Update primary care provider; care coordination with daughter (as discussed and agreed upon by patient).   Will close case.  Primary care provider's office was called to give assessment update on patient's status; notify of emergency room visit on 7/6; patient expressed not wanting to continue with [facility] services and instructions to call patient's daughter  for follow-up since patient's phone number is not working at present.  Daughter was also called to inform of patient's present condition and primary care provider being notified of such. She was made aware that she will be contacted for follow-up by primary care provider's office since patient's phone is not working. Daughter was also informed that patient expressed desire not to continue with [name] services but can be referred back anytime if patient will agree. Discussed with daughter about [facility] care management services.    Rubie Ficco A. Shonnie Poudrier, BSN, RN-BC [place] Coordinator Cell: [phone]

## 2015-04-10 NOTE — Telephone Encounter (Signed)
i have tried to call daughter, this am i did put my ph# in her pager as her vmail is full, i have left [name]. A message and possibly a [facility] nurse could go out and have the daughter call us back, i placed her in a 1500 today slot just in case i do reach them, if i cant by 1230 i will remove her from the slot to free for other pts

## 2015-04-10 NOTE — Telephone Encounter (Signed)
Attempted to call daughter again and [place], no answer, no vmail for daughter

## 2015-04-10 NOTE — Patient Outreach (Signed)
[facility]) Care Management  04/10/2015  ROLENA SAPPENFIELD 10/05/1950 [id number]  Follow up: Care Manager spoke with Primary care physician's nurse, Freddy Finner, who reports that she is has been unable to reach [facility] daughter. She states she will continue to attempt to reach member's daughter. Ms. Buena Irish noted member also has a son listed in the chart and she will also try to contact member's son. Care manager discussed member request to discontinue with [place] Management services. Ms. Buena Irish informed that member can be re-referred at any time, if member's would like to resume services.  Plan: close case.   Thea Silversmith, RN, MSN, [place] Coordinator Cell: [phone]

## 2015-04-10 NOTE — Telephone Encounter (Signed)
i have tried to reach the daughter and her vmail was full, left the ph# thru a pager system? dont know if this will work, left a message for Nicut to call me back. Will see if they can go out to give her the message

## 2015-04-13 NOTE — Patient Outreach (Signed)
[facility]) Care Management  04/13/2015  [name] [birth year] [id number]   Notification from Thea Silversmith, RN to close case due to patient does not want to participate with [place] Management services.  [name]. [place], [place] Management [place] Assistant Phone: [phone] Fax: [phone]

## 2015-04-16 ENCOUNTER — Encounter (HOSPITAL_COMMUNITY): Payer: Self-pay

## 2015-04-16 ENCOUNTER — Encounter (HOSPITAL_COMMUNITY)
Admission: RE | Disposition: A | Discharge status: Home / Self Care | Payer: Self-pay | Source: Ambulatory Visit | Attending: Gastroenterology

## 2015-04-16 ENCOUNTER — Ambulatory Visit (HOSPITAL_COMMUNITY)
Admission: RE | Admit: 2015-04-16 | Discharge: 2015-04-16 | Disposition: A | Discharge status: Home / Self Care | Payer: Medicare Other | Source: Ambulatory Visit | Attending: Gastroenterology | Admitting: Gastroenterology

## 2015-04-16 ENCOUNTER — Ambulatory Visit (HOSPITAL_COMMUNITY): Payer: Medicare Other | Admitting: Anesthesiology

## 2015-04-16 DIAGNOSIS — I509 Heart failure, unspecified: Secondary | ICD-10-CM | POA: Insufficient documentation

## 2015-04-16 DIAGNOSIS — Z79899 Other long term (current) drug therapy: Secondary | ICD-10-CM | POA: Diagnosis not present

## 2015-04-16 DIAGNOSIS — Z7982 Long term (current) use of aspirin: Secondary | ICD-10-CM | POA: Insufficient documentation

## 2015-04-16 DIAGNOSIS — K2970 Gastritis, unspecified, without bleeding: Secondary | ICD-10-CM

## 2015-04-16 DIAGNOSIS — R1032 Left lower quadrant pain: Secondary | ICD-10-CM

## 2015-04-16 DIAGNOSIS — Z888 Allergy status to other drugs, medicaments and biological substances status: Secondary | ICD-10-CM | POA: Diagnosis not present

## 2015-04-16 DIAGNOSIS — F17210 Nicotine dependence, cigarettes, uncomplicated: Secondary | ICD-10-CM | POA: Insufficient documentation

## 2015-04-16 DIAGNOSIS — E119 Type 2 diabetes mellitus without complications: Secondary | ICD-10-CM | POA: Diagnosis not present

## 2015-04-16 DIAGNOSIS — K2950 Unspecified chronic gastritis without bleeding: Secondary | ICD-10-CM | POA: Diagnosis not present

## 2015-04-16 DIAGNOSIS — R109 Unspecified abdominal pain: Secondary | ICD-10-CM | POA: Diagnosis not present

## 2015-04-16 DIAGNOSIS — F329 Major depressive disorder, single episode, unspecified: Secondary | ICD-10-CM | POA: Diagnosis not present

## 2015-04-16 DIAGNOSIS — K2990 Gastroduodenitis, unspecified, without bleeding: Secondary | ICD-10-CM | POA: Diagnosis not present

## 2015-04-16 DIAGNOSIS — I10 Essential (primary) hypertension: Secondary | ICD-10-CM | POA: Diagnosis not present

## 2015-04-16 DIAGNOSIS — Z95810 Presence of automatic (implantable) cardiac defibrillator: Secondary | ICD-10-CM | POA: Insufficient documentation

## 2015-04-16 DIAGNOSIS — Z794 Long term (current) use of insulin: Secondary | ICD-10-CM | POA: Insufficient documentation

## 2015-04-16 DIAGNOSIS — Z86010 Personal history of colonic polyps: Secondary | ICD-10-CM | POA: Insufficient documentation

## 2015-04-16 DIAGNOSIS — Z1211 Encounter for screening for malignant neoplasm of colon: Secondary | ICD-10-CM | POA: Diagnosis not present

## 2015-04-16 DIAGNOSIS — E785 Hyperlipidemia, unspecified: Secondary | ICD-10-CM | POA: Diagnosis not present

## 2015-04-16 DIAGNOSIS — I429 Cardiomyopathy, unspecified: Secondary | ICD-10-CM | POA: Insufficient documentation

## 2015-04-16 DIAGNOSIS — Z6841 Body Mass Index (BMI) 40.0 and over, adult: Secondary | ICD-10-CM | POA: Insufficient documentation

## 2015-04-16 HISTORY — PX: COLONOSCOPY: SHX5424

## 2015-04-16 HISTORY — PX: ESOPHAGOGASTRODUODENOSCOPY: SHX5428

## 2015-04-16 HISTORY — DX: Dependence on supplemental oxygen: Z99.81

## 2015-04-16 SURGERY — EGD (ESOPHAGOGASTRODUODENOSCOPY)
Anesthesia: Monitor Anesthesia Care

## 2015-04-16 MED ORDER — SODIUM CHLORIDE 0.9 % IV SOLN: INTRAVENOUS | Status: DC

## 2015-04-16 MED ORDER — BUTAMBEN-TETRACAINE-BENZOCAINE 2-2-14 % EX AERO
INHALATION_SPRAY | CUTANEOUS | Status: DC | PRN
  Administered 2015-04-16: 2 via TOPICAL

## 2015-04-16 MED ORDER — PROPOFOL 10 MG/ML IV BOLUS
INTRAVENOUS | Status: AC
  Filled 2015-04-16: qty 20

## 2015-04-16 MED ORDER — LACTATED RINGERS IV SOLN
INTRAVENOUS | Status: DC
  Administered 2015-04-16: 1000 mL via INTRAVENOUS

## 2015-04-16 MED ORDER — PROPOFOL 10 MG/ML IV BOLUS
INTRAVENOUS | Status: DC | PRN
  Administered 2015-04-16: 10 mg via INTRAVENOUS
  Administered 2015-04-16: 30 mg via INTRAVENOUS
  Administered 2015-04-16 (×2): 20 mg via INTRAVENOUS
  Administered 2015-04-16: 30 mg via INTRAVENOUS
  Administered 2015-04-16: 10 mg via INTRAVENOUS
  Administered 2015-04-16 (×3): 30 mg via INTRAVENOUS
  Administered 2015-04-16: 10 mg via INTRAVENOUS
  Administered 2015-04-16: 30 mg via INTRAVENOUS

## 2015-04-16 NOTE — Anesthesia Postprocedure Evaluation (Signed)
  Anesthesia Post-op Note  Patient: [name]  Procedure(s) Performed: Procedure(s) (LRB): ESOPHAGOGASTRODUODENOSCOPY (EGD) (N/A) COLONOSCOPY (N/A)  Patient Location: PACU  Anesthesia Type: MAC  Level of Consciousness: awake and alert   Airway and Oxygen Therapy: Patient Spontanous Breathing  Post-op Pain: mild  Post-op Assessment: Post-op Vital signs reviewed, Patient's Cardiovascular Status Stable, Respiratory Function Stable, Patent Airway and No signs of Nausea or vomiting  Last Vitals:  Filed Vitals:   04/16/15 0830  BP: 126/60  Pulse: 68  Temp:   Resp: 21    Post-op Vital Signs: stable   Complications: No apparent anesthesia complications

## 2015-04-16 NOTE — H&P (View-Only) (Signed)
Patient ID: [name], female   DOB: 03/15/1951, 64 y.o.   MRN: [id number]   Subjective:    Patient ID: [name], female    DOB: 1951/01/20, 64 y.o.   MRN: [id number]  HPI Redonda is a pleasant 64 year old African-American female who has recently been undergoing evaluation for left-sided abdominal pain. She had been referred to GI from [facility]  internal medicine clinic because of  abnormal CT scan with possible pancreatitis.he has history of insulin-dependent diabetes mellitus, congestive heart failure with nonischemic cardiomyopathy and EF of 35-40% and is status post defibrillator placement. Also with hypertension. She had CT of the abdomen and pelvis done on 01/27/2015 without IV contrast and this showed mild soft tissue inflammation around the head and proximal body of the pancreas extending above the hepatic hilum also with somewhat nodular appearance of the pancreatic head. Labs done at that time showed normal LFTs and normal lipase and CBC. We scheduled her for repeat CT scan of the abdomen and pelvis with contrast which was done on 02/26/2015. And this was a negative study with no abdominal or pelvic findings in stable most Attenuation pattern of the lung bases. It is not clear whether she actually had any inflammation of her pancreas or whether findings on initial CT were due to limitations of contrast. She comes back in today for follow-up. She has been on Prilosec 20 mg by mouth daily over the past several weeks. She says that overall she does feel better and that her previous pain maybe 50-60% better. She still has a nagging discomfort in her left upper quadrant and left mid quadrant which has not resolved. She is eating without difficulty has no nausea or vomiting but says that her discomfort is worse postprandially. Bowel movements have been normal no melena or hematochezia.  Review of Systems Pertinent positive and negative review of systems were noted in the above HPI section.  All other  review of systems was otherwise negative.  Outpatient Encounter Prescriptions as of 03/20/2015  Medication Sig  . albuterol (VENTOLIN HFA) 108 (90 BASE) MCG/ACT inhaler Inhale 2 puffs into the lungs every 6 (six) hours as needed. Shortness of breath  . amLODipine (NORVASC) 10 MG tablet Take 1 tablet (10 mg total) by mouth daily.  [name] aspirin EC 81 MG tablet Take 81 mg by mouth daily.  [name] atorvastatin (LIPITOR) 80 MG tablet Take 1 tablet (80 mg total) by mouth at bedtime.  . furosemide (LASIX) 20 MG tablet Take 2 tablets (40 mg total) by mouth daily.  [name] gabapentin (NEURONTIN) 300 MG capsule Take 1 capsule (300 mg total) by mouth at bedtime.  [name] glucose blood (ONE TOUCH ULTRA TEST) test strip Use as instructed by doctor up to 3x daily,dx code 250.02 insulin requiring  . HYDROcodone-acetaminophen (NORCO/VICODIN) 5-325 MG per tablet   . insulin glargine (LANTUS) 100 UNIT/ML injection Inject 0.4 mLs (40 Units total) into the skin daily with breakfast.  . insulin lispro (HUMALOG KWIKPEN) 100 UNIT/ML KiwkPen Inject 5 units with breakfast and 5 units with dinner, hold if CBGs low or not eating and call MD  . lisinopril (PRINIVIL,ZESTRIL) 20 MG tablet Take 1 tablet (20 mg total) by mouth daily.  . metoprolol succinate (TOPROL-XL) 50 MG 24 hr tablet Take 1 tablet (50 mg total) by mouth daily. Take with or immediately following a meal.  . nitroGLYCERIN (NITROSTAT) 0.4 MG SL tablet Place 0.4 mg under the tongue every 5 (five) minutes as needed for chest pain.  [name]  omeprazole (PRILOSEC) 20 MG capsule Take one tablet daily 30 minutes before breakfast  . metFORMIN (GLUCOPHAGE XR) 500 MG 24 hr tablet Take 1 tablet (500 mg total) by mouth daily with breakfast. (Patient not taking: Reported on 03/12/2015)  . polyethylene glycol powder (GLYCOLAX/MIRALAX) powder Take 255 g by mouth once.   No facility-administered encounter medications on file as of 03/20/2015.   Allergies  Allergen Reactions  . Actos [Pioglitazone]      REACTION: congestive heart failure ACTOS  . Avandia [Rosiglitazone Maleate]     unknown  . Hydrocortisone     unknown   Patient Active Problem List   Diagnosis Date Noted  . Abnormal CT of the abdomen 02/09/2015  . CAP (community acquired pneumonia) 08/08/2014  . Diabetic gastroparesis associated with type 2 diabetes mellitus 07/12/2014  . Depression 05/22/2014  . R thigh pedunculated lesion 04/08/2014  . Implantable cardioverter-defibrillator (ICD) generator end of life   . Health care maintenance 01/22/2013  . Nonischemic cardiomyopathy 04/03/2012  . Type 2 diabetes mellitus with stage 2 chronic kidney disease 04/03/2012  . DOE (dyspnea on exertion) 10/19/2011  . ALOPECIA 01/13/2010  . TOBACCO ABUSE 10/26/2009  . HYPERTHYROIDISM, SUBCLINICAL 05/06/2009  . POSTMENOPAUSAL SYNDROME 04/27/2009  . HYSTEROSCOPY, HX OF 04/27/2009  . SLEEP APNEA 08/20/2008  . Body mass index (BMI) of 45.0-49.9 in adult 02/11/2008  . ECZEMA 11/26/2007  . LIPOMA 08/01/2007  . Hyperlipidemia associated with type 2 diabetes mellitus 05/25/2007  . HYPERTENSION, BENIGN ESSENTIAL 05/25/2007  . Chronic combined systolic and diastolic heart failure 123456  . Automatic implantable cardioverter-defibrillator in situ 05/04/2007   History   Social History  . Marital Status: Widowed    Spouse Name: Alroy Dust  . Number of Children: 3  . Years of Education: 11   Occupational History  . disabled    Social History Main Topics  . Smoking status: Heavy Tobacco Smoker -- 0.50 packs/day for 35 years    Types: Cigarettes  . Smokeless tobacco: Never Used     Comment: currently smoking 3-4 cigs daily or less.  . Alcohol Use: 0.6 oz/week    1 Glasses of wine per week     Comment: Occassionally  . Drug Use: No  . Sexual Activity: Not on file   Other Topics Concern  . Not on file   Social History Narrative   ** Merged History Encounter **       Married    Ms. Arvie's family history includes Asthma in  her maternal aunt and maternal uncle; Diabetes in her sister; Heart disease in her father, maternal aunt, maternal grandfather, maternal uncle, paternal aunt, and paternal uncle; Seizures in her father; Stroke in her mother. There is no history of Colon cancer, Colon polyps, Esophageal cancer, Kidney disease, or Gallbladder disease.      Objective:    Filed Vitals:   03/20/15 1025  BP: 124/78  Pulse: 60    Physical Exam  well-developed African American female in no acute distress, pleasant blood pressure 124/78 pulse 60 height 5 foot weight 232, BMI 45. HEENT; nontraumatic normocephalic EOMI PERRLA sclera anicteric, Supple; no JVD, Cardiovascular;regular rate and rhythm with Q000111Q soft systolic murmur, Pulmonary ;clear bilaterally, Abdomen; large soft is tender in the left upper and left mid quadrant is no guarding or rebound no palpable mass or hepatosplenomegaly bowel sounds are present, Rectal; exam not done, Extremities; no clubbing cyanosis or edema skin warm and dry, Psych ;mood and affect appropriate       Assessment &  Plan:   #1 64 yo female with persistent left upper and left mid quadrant abdominal pain x 2 months.  Most recent Ct unremarkable, some improvement with PPI- etiology of her pain is not clear #2 abnormal CT  01/2015 ? Pancreatitis-follow up Ct 02/2015 normal  #2 NICCM- ef 35-40 % s/p ICD placement #3 CHF #4 nocturnal  O 2  Use  #5 morbid obesity #6 IDDM   plan; Continue  Prilosec 20 mg po daily  Schedule for EGD and Colonoscopy with Dr. Ardis Hughs at [facility] . Procedures discussed in detail with pt and she is agreeable to proceed.        Nollie Terlizzi Genia Harold PA-C 03/20/2015   Cc: Wilber Oliphant, MD

## 2015-04-16 NOTE — Op Note (Signed)
[facility] [place], [address]   ENDOSCOPY PROCEDURE REPORT  PATIENT: Peggy, [name]  MR#: [id number] BIRTHDATE: December 10, 1950 , 63  yrs. old GENDER: female ENDOSCOPIST: [name], MD PROCEDURE DATE:  04/16/2015 PROCEDURE:  EGD w/ biopsy ASA CLASS:     Class III INDICATIONS:  LLQ pain. MEDICATIONS: Monitored anesthesia care TOPICAL ANESTHETIC: none  DESCRIPTION OF PROCEDURE: After the risks benefits and alternatives of the procedure were thoroughly explained, informed consent was obtained.  The [id number]) endoscope was introduced through the mouth and advanced to the second portion of the duodenum , Without limitations.  The instrument was slowly withdrawn as the mucosa was fully examined.    There was mild, non-specific pangastritis.  This was biopsied distally and sent to pathology.  The examination was othewise normal.  Retroflexed views revealed no abnormalities.     The scope was then withdrawn from the patient and the procedure completed.  COMPLICATIONS: There were no immediate complications.  ENDOSCOPIC IMPRESSION: There was mild, non-specific pangastritis.  This was biopsied distally and sent to pathology.  The examination was othewise normal  RECOMMENDATIONS: Await final pathology.  If positive for H.  pylori you will be started on appropriate antibiotics.   eSigned:  [name], MD 2015-04-16 [phone]

## 2015-04-16 NOTE — Discharge Instructions (Signed)

## 2015-04-16 NOTE — Op Note (Signed)
[facility] [place], [address]   COLONOSCOPY PROCEDURE REPORT  PATIENT: Peggy, [name]  MR#: [id number] BIRTHDATE: 03/10/1951 , 63  yrs. old GENDER: female ENDOSCOPIST: [name], MD PROCEDURE DATE:  04/16/2015 PROCEDURE:   Colonoscopy, diagnostic First Screening Colonoscopy - Avg.  risk and is 50 yrs.  old or older - No.  Prior Negative Screening - Now for repeat screening. N/A  History of Adenoma - Now for follow-up colonoscopy & has been > or = to 3 yrs.  N/A  Recommend repeat exam, <10 yrs? No ASA CLASS:   Class III INDICATIONS:LLQ pain for several months; colonoscopy 2014 Dr.  Benson [place] found polyp that was not adenomatous; recent CT scans unrevealing.  MEDICATIONS: Monitored anesthesia care  DESCRIPTION OF PROCEDURE:   After the risks benefits and alternatives of the procedure were thoroughly explained, informed consent was obtained.  The digital rectal exam revealed no abnormalities of the rectum.   The Pentax S[employer] Colonicsope [id number])  endoscope was introduced through the anus and advanced to the cecum, which was identified by both the appendix and ileocecal valve. No adverse events experienced.   The quality of the prep was excellent.  The instrument was then slowly withdrawn as the colon was fully examined. Estimated blood loss is zero unless otherwise noted in this procedure report.   COLON FINDINGS: A normal appearing cecum, ileocecal valve, and appendiceal orifice were identified.  The ascending, transverse, descending, sigmoid colon, and rectum appeared unremarkable. Retroflexed views revealed no abnormalities. The time to cecum = 3 min Withdrawal time = 6 min   The scope was withdrawn and the procedure completed. COMPLICATIONS: There were no immediate complications.  ENDOSCOPIC IMPRESSION: Normal colonoscopy  RECOMMENDATIONS: Proceed with EGD now.  eSigned:  [name], MD 04/16/2015 8:23 AM

## 2015-04-16 NOTE — Interval H&P Note (Signed)
History and Physical Interval Note:  04/16/2015 7:59 AM  [name]  has presented today for surgery, with the diagnosis of left abdominal pain x 3 months  The various methods of treatment have been discussed with the patient and family. After consideration of risks, benefits and other options for treatment, the patient has consented to  Procedure(s): ESOPHAGOGASTRODUODENOSCOPY (EGD) (N/A) COLONOSCOPY (N/A) as a surgical intervention .  The patient's history has been reviewed, patient examined, no change in status, stable for surgery.  I have reviewed the patient's chart and labs.  Questions were answered to the patient's satisfaction.     [name]

## 2015-04-16 NOTE — Transfer of Care (Signed)
Immediate Anesthesia Transfer of Care Note  Patient: [name]  Procedure(s) Performed: Procedure(s): ESOPHAGOGASTRODUODENOSCOPY (EGD) (N/A) COLONOSCOPY (N/A)  Patient Location: PACU  Anesthesia Type:MAC  Level of Consciousness: sedated  Airway & Oxygen Therapy: Patient Spontanous Breathing and Patient connected to nasal cannula oxygen  Post-op Assessment: Report given to RN and Post -op Vital signs reviewed and stable  Post vital signs: Reviewed and stable  Last Vitals:  Filed Vitals:   04/16/15 0720  BP: 160/69  Pulse: 77  Temp: 36.7 C  Resp: 23    Complications: No apparent anesthesia complications

## 2015-04-16 NOTE — Anesthesia Preprocedure Evaluation (Signed)
Anesthesia Evaluation  Patient identified by MRN, date of birth, ID band Patient awake    Reviewed: Allergy & Precautions, NPO status , Patient's Chart, lab work & pertinent test results  Airway Mallampati: II  TM Distance: >3 FB Neck ROM: Full    Dental no notable dental hx.    Pulmonary sleep apnea , COPD COPD inhaler, Current Smoker,  O2 at night breath sounds clear to auscultation  Pulmonary exam normal - wheezing      Cardiovascular hypertension, +CHF Normal cardiovascular exam+ Cardiac Defibrillator Rhythm:Regular Rate:Normal + Systolic murmurs    Neuro/Psych negative neurological ROS  negative psych ROS   GI/Hepatic negative GI ROS, Neg liver ROS,   Endo/Other  diabetes, Insulin DependentHyperthyroidism   Renal/GU negative Renal ROS  negative genitourinary   Musculoskeletal negative musculoskeletal ROS (+)   Abdominal   Peds negative pediatric ROS (+)  Hematology negative hematology ROS (+)   Anesthesia Other Findings   Reproductive/Obstetrics negative OB ROS                             Anesthesia Physical Anesthesia Plan  ASA: IV  Anesthesia Plan: MAC   Post-op Pain Management:    Induction: Intravenous  Airway Management Planned: Nasal Cannula  Additional Equipment:   Intra-op Plan:   Post-operative Plan:   Informed Consent: I have reviewed the patients History and Physical, chart, labs and discussed the procedure including the risks, benefits and alternatives for the proposed anesthesia with the patient or authorized representative who has indicated his/her understanding and acceptance.   Dental advisory given  Plan Discussed with: CRNA and Surgeon  Anesthesia Plan Comments:         Anesthesia Quick Evaluation

## 2015-04-17 ENCOUNTER — Encounter (HOSPITAL_COMMUNITY): Payer: Self-pay | Admitting: Gastroenterology

## 2015-04-20 LAB — GLUCOSE, CAPILLARY: Glucose-Capillary: 201 mg/dL — ABNORMAL HIGH (ref 65–99)

## 2015-04-29 DIAGNOSIS — J449 Chronic obstructive pulmonary disease, unspecified: Secondary | ICD-10-CM | POA: Diagnosis not present

## 2015-05-14 ENCOUNTER — Other Ambulatory Visit: Payer: Self-pay | Admitting: Internal Medicine

## 2015-05-15 ENCOUNTER — Other Ambulatory Visit: Payer: Self-pay | Admitting: Internal Medicine

## 2015-05-28 ENCOUNTER — Ambulatory Visit (INDEPENDENT_AMBULATORY_CARE_PROVIDER_SITE_OTHER): Payer: Medicare Other | Admitting: *Deleted

## 2015-05-28 DIAGNOSIS — Z95810 Presence of automatic (implantable) cardiac defibrillator: Secondary | ICD-10-CM

## 2015-05-28 DIAGNOSIS — I429 Cardiomyopathy, unspecified: Secondary | ICD-10-CM | POA: Diagnosis not present

## 2015-05-28 DIAGNOSIS — I5042 Chronic combined systolic (congestive) and diastolic (congestive) heart failure: Secondary | ICD-10-CM

## 2015-05-28 DIAGNOSIS — I428 Other cardiomyopathies: Secondary | ICD-10-CM

## 2015-05-28 NOTE — Progress Notes (Signed)
Remote ICD transmission.   

## 2015-05-30 DIAGNOSIS — J449 Chronic obstructive pulmonary disease, unspecified: Secondary | ICD-10-CM | POA: Diagnosis not present

## 2015-06-02 ENCOUNTER — Ambulatory Visit (INDEPENDENT_AMBULATORY_CARE_PROVIDER_SITE_OTHER): Payer: Medicare Other | Admitting: Internal Medicine

## 2015-06-02 ENCOUNTER — Encounter: Payer: Self-pay | Admitting: Internal Medicine

## 2015-06-02 DIAGNOSIS — I10 Essential (primary) hypertension: Secondary | ICD-10-CM

## 2015-06-02 DIAGNOSIS — D1739 Benign lipomatous neoplasm of skin and subcutaneous tissue of other sites: Secondary | ICD-10-CM | POA: Diagnosis not present

## 2015-06-02 DIAGNOSIS — D1723 Benign lipomatous neoplasm of skin and subcutaneous tissue of right leg: Secondary | ICD-10-CM

## 2015-06-02 DIAGNOSIS — Z79899 Other long term (current) drug therapy: Secondary | ICD-10-CM | POA: Diagnosis not present

## 2015-06-02 DIAGNOSIS — Z23 Encounter for immunization: Secondary | ICD-10-CM | POA: Diagnosis not present

## 2015-06-02 DIAGNOSIS — D1720 Benign lipomatous neoplasm of skin and subcutaneous tissue of unspecified limb: Secondary | ICD-10-CM

## 2015-06-02 MED ORDER — LISINOPRIL 40 MG PO TABS: 40.0000 mg | ORAL_TABLET | Freq: Every day | ORAL | Status: DC

## 2015-06-02 NOTE — Patient Instructions (Addendum)
Ms. Carreno it was nice meeting you today. I have referred you to General Surgery for the swelling on your right thigh. You can take over the counter Tylenol as needed for pain. I have ordered some labs and you will be informed when the results come back. Please return for a follow up visit with Dr. Marvel Plan on 06/24/15.   Start taking:  Lisinopril 40 mg: 1 tablet by mouth daily   STOP taking:  Lisinopril 20 mg: 1 tablet by mouth daily

## 2015-06-03 LAB — CBC WITH DIFFERENTIAL/PLATELET
Basophils Absolute: 0 10*3/uL (ref 0.0–0.2)
Basos: 0 %
EOS (ABSOLUTE): 0.4 10*3/uL (ref 0.0–0.4)
Eos: 4 %
Hematocrit: 42.5 % (ref 34.0–46.6)
Hemoglobin: 13.3 g/dL (ref 11.1–15.9)
Immature Grans (Abs): 0 10*3/uL (ref 0.0–0.1)
Immature Granulocytes: 0 %
Lymphocytes Absolute: 2.1 10*3/uL (ref 0.7–3.1)
Lymphs: 22 %
MCH: 26.6 pg (ref 26.6–33.0)
MCHC: 31.3 g/dL — ABNORMAL LOW (ref 31.5–35.7)
MCV: 85 fL (ref 79–97)
Monocytes Absolute: 0.6 10*3/uL (ref 0.1–0.9)
Monocytes: 7 %
Neutrophils Absolute: 6.2 10*3/uL (ref 1.4–7.0)
Neutrophils: 67 %
Platelets: 240 10*3/uL (ref 150–379)
RBC: 5 x10E6/uL (ref 3.77–5.28)
RDW: 16.9 % — ABNORMAL HIGH (ref 12.3–15.4)
WBC: 9.2 10*3/uL (ref 3.4–10.8)

## 2015-06-03 NOTE — Progress Notes (Signed)
Patient ID: [name], female   DOB: 1950/12/26, 64 y.o.   MRN: [id number]   Subjective:   Patient ID: [name] female   DOB: 04-Oct-1950 64 y.o.   MRN: [id number]  HPI: [name] is a 64 y.o. F with a PMHx as listed below presenting to the clinic with a chief complaint of R thigh swelling. Patient states she has surgery for removal of fatty tissue at the same site several years ago. States the lateral aspect of her right thigh has been swollen and painful for the past 2 weeks. Denies noticing any erythema, increased warmth, or drainage in the area. Denies any fevers, chills, nausea, or vomiting. Epic records show her Lipoma was removed in 2009.     Past Medical History  Diagnosis Date  . Hyperthyroidism, subclinical   . Post menopausal syndrome   . Obesity   . Cardiac defibrillator in situ     Atlas II VR (SJM) implanted by Dr Lovena Le  . Eczema   . Lipoma   . Chronic ulcer of leg     04-09-15 resolved-not a problem.  . Hyperlipidemia   . Chronic combined systolic and diastolic heart failure     a. EF 35-40% in past;  b. Echo 7/13:  EF 45-50%, Gr 2 diast dysfn, mild AI, mild MAC, trivial MR, mild LAE, PASP 47.  [name] NICM (nonischemic cardiomyopathy)   . Diabetes mellitus   . HTN (hypertension)   . Elevated alkaline phosphatase level     GGT and 5'nucleotidase 8/13 normal  . Hx of cardiac cath     a. [place] 2003 normal;  b. [facility] 6/13:  Mild calcification in the LM, o/w normal coronary arteries, EF 45%.   . Sleep apnea     pt denies 04/12/2013  . COPD (chronic obstructive pulmonary disease)     O2 at night  . Automatic implantable cardioverter-defibrillator in situ   . CHF (congestive heart failure)   . Depression   . Implantable cardioverter-defibrillator (ICD) generator end of life   . Colon polyps 04/12/2013    Rectosigmoid polyp  . History of oxygen administration     oxygen @ 2 l/m nasally bedtime 24/7  . ATN (acute tubular necrosis) 07/15/2014   Current Outpatient  Prescriptions  Medication Sig Dispense Refill  . albuterol (VENTOLIN HFA) 108 (90 BASE) MCG/ACT inhaler Inhale 2 puffs into the lungs every 6 (six) hours as needed. Shortness of breath 1 Inhaler 6  . amLODipine (NORVASC) 10 MG tablet TAKE 1 TABLET BY MOUTH DAILY 30 tablet 11  . aspirin EC 81 MG tablet Take 81 mg by mouth daily.    [name] atorvastatin (LIPITOR) 80 MG tablet Take 1 tablet (80 mg total) by mouth at bedtime. 90 tablet 3  . furosemide (LASIX) 20 MG tablet Take 2 tablets (40 mg total) by mouth daily. 60 tablet 3  . gabapentin (NEURONTIN) 300 MG capsule Take 1 capsule (300 mg total) by mouth at bedtime. 30 capsule 11  . glucose blood (ONE TOUCH ULTRA TEST) test strip Use as instructed by doctor up to 3x daily,dx code 250.02 insulin requiring 100 each 5  . HYDROcodone-acetaminophen (NORCO/VICODIN) 5-325 MG per tablet Take 1 tablet by mouth at bedtime as needed for moderate pain.   0  . insulin glargine (LANTUS) 100 UNIT/ML injection Inject 0.4 mLs (40 Units total) into the skin daily with breakfast. 20 mL 3  . insulin lispro (HUMALOG KWIKPEN) 100 UNIT/ML KiwkPen Inject 5 units with breakfast  and 5 units with dinner, hold if CBGs low or not eating and call MD 15 mL 3  . lisinopril (PRINIVIL,ZESTRIL) 40 MG tablet Take 1 tablet (40 mg total) by mouth daily. 90 tablet 0  . metoprolol succinate (TOPROL-XL) 50 MG 24 hr tablet Take 1 tablet (50 mg total) by mouth daily. 90 tablet 3  . nitroGLYCERIN (NITROSTAT) 0.4 MG SL tablet Place 0.4 mg under the tongue every 5 (five) minutes as needed for chest pain.    [name] omeprazole (PRILOSEC) 20 MG capsule Take one tablet daily 30 minutes before breakfast 30 capsule 2  . ondansetron (ZOFRAN-ODT) 8 MG disintegrating tablet Take 8 mg by mouth every 8 (eight) hours as needed for nausea or vomiting.    . polyethylene glycol powder (GLYCOLAX/MIRALAX) powder Take 255 g by mouth once. 255 g 0   No current facility-administered medications for this visit.   Family  History  Problem Relation Age of Onset  . Stroke Mother   . Seizures Father   . Diabetes Sister   . Asthma Maternal Aunt     aunts  . Asthma Maternal Uncle     uncles  . Heart disease Father   . Heart disease Paternal Aunt     aunts  . Heart disease Paternal Uncle     uncles  . Heart disease Maternal Aunt     aunts  . Heart disease Maternal Uncle     uncles  . Heart disease Maternal Grandfather   . Colon cancer Neg Hx   . Colon polyps Neg Hx   . Esophageal cancer Neg Hx   . Kidney disease Neg Hx   . Gallbladder disease Neg Hx    Social History   Social History  . Marital Status: Widowed    Spouse Name: Alroy Dust  . Number of Children: 3  . Years of Education: 11   Occupational History  . disabled    Social History Main Topics  . Smoking status: Heavy Tobacco Smoker -- 0.50 packs/day for 35 years    Types: Cigarettes  . Smokeless tobacco: Never Used     Comment: currently smoking 3-4 cigs daily or less./ 1 pack every 3 days  . Alcohol Use: 0.6 oz/week    1 Glasses of wine per week     Comment: Occassionally -weekends-wine  . Drug Use: No  . Sexual Activity: Not Asked   Other Topics Concern  . None   Social History Narrative   ** Merged History Encounter **       Married   Review of Systems: Review of Systems  Constitutional: Negative for fever and chills.  HENT: Negative for ear pain.   Eyes: Negative for blurred vision and pain.  Respiratory: Negative for cough, shortness of breath and wheezing.   Cardiovascular: Negative for chest pain and palpitations.  Gastrointestinal: Negative for nausea, vomiting, abdominal pain, diarrhea and constipation.  Genitourinary: Negative for dysuria, urgency and frequency.  Skin: Negative for itching and rash.       Lateral aspect of R thigh: presence of an old 8 inch long surgical scar. Area of swelling 6 inch x 3 inch adjacent to the old scar. Area is mildly tender to palpation. No erythema, increased warmth, or  drainage from the site.   Neurological: Negative for dizziness, sensory change, focal weakness and headaches.    Objective:  Physical Exam: There were no vitals filed for this visit.  Physical Exam  Constitutional: She is oriented to person, place, and time.  She appears well-developed and well-nourished. No distress.  HENT:  Head: Normocephalic and atraumatic.  Eyes: EOM are normal. Pupils are equal, round, and reactive to light.  Neck: Neck supple. No tracheal deviation present.  Cardiovascular: Normal rate, regular rhythm and intact distal pulses.  Exam reveals no gallop and no friction rub.   No murmur heard. Pulmonary/Chest: Effort normal and breath sounds normal. No respiratory distress. She has no wheezes. She has no rales.  Abdominal: Soft. Bowel sounds are normal. She exhibits no distension. There is no tenderness.  Musculoskeletal: She exhibits edema.  Trace b/l pitting edema of lower extremities   Neurological: She is alert and oriented to person, place, and time.  Skin: Skin is warm and dry.    Assessment & Plan:

## 2015-06-03 NOTE — Assessment & Plan Note (Addendum)
Patient likely has recurrence of lipoma (6 inch x 3 inch) at the same site on the lateral aspect of the R thigh. Not likely cellulitis because there is no erythema or increased in the area. Not likely an abscess because area is only mildly tender to palpation and patient is afebrile. CBC at this visit did not show leukocytosis.  -Surgery referral for possible resection of recurring lipoma -OTC tylenol for mild pain -Reassess at f/u visit

## 2015-06-03 NOTE — Assessment & Plan Note (Signed)
BP 158/72. Goal <130/80. Patient states she has cuff & machine at home but does not do home monitoring of her BP. Denies any CP or SOB. Physical exam revealed trace pitting edema of bilateral lower extremities at the shin level and non-pitting edema at the dorsum of the foot and ankle. Non-pitting edema likely 2/2 Amlodipine.  -Suggested she start checking her BP at home daily and keep a log of the readings -Lisinopril increased from 20 mg qd to 40 mg qd -Continue current doses of all other meds: Amlodipine 10 mg qd, Furosemide 40 mg BID -Recheck BP at next visit.

## 2015-06-09 NOTE — Progress Notes (Signed)
Internal Medicine Clinic Attending  I saw and evaluated the patient.  I personally confirmed the key portions of the history and exam documented by Dr. Rathore and I reviewed pertinent patient test results.  The assessment, diagnosis, and plan were formulated together and I agree with the documentation in the resident's note.  

## 2015-06-15 ENCOUNTER — Other Ambulatory Visit: Payer: Self-pay | Admitting: Internal Medicine

## 2015-06-15 NOTE — Telephone Encounter (Signed)
Patient requesting a Vicodin Refill.

## 2015-06-15 NOTE — Telephone Encounter (Signed)
Last refill 4/27

## 2015-06-16 NOTE — Telephone Encounter (Signed)
Do we know why this patient is requesting pain medication? It doesn't appear that she is prescribed it for chronic pain. If this is new pain, she needs to be evaluated in the clinic.

## 2015-06-17 ENCOUNTER — Encounter: Payer: Self-pay | Admitting: Cardiology

## 2015-06-18 ENCOUNTER — Inpatient Hospital Stay (HOSPITAL_COMMUNITY)
Admission: EM | Admit: 2015-06-18 | Discharge: 2015-06-19 | DRG: 191 | Disposition: A | Discharge status: Home / Self Care | Payer: Medicare Other | Attending: Internal Medicine | Admitting: Internal Medicine

## 2015-06-18 ENCOUNTER — Emergency Department (HOSPITAL_COMMUNITY): Payer: Medicare Other

## 2015-06-18 ENCOUNTER — Encounter (HOSPITAL_COMMUNITY): Payer: Self-pay

## 2015-06-18 DIAGNOSIS — E785 Hyperlipidemia, unspecified: Secondary | ICD-10-CM | POA: Diagnosis not present

## 2015-06-18 DIAGNOSIS — R0602 Shortness of breath: Secondary | ICD-10-CM | POA: Diagnosis not present

## 2015-06-18 DIAGNOSIS — R0902 Hypoxemia: Secondary | ICD-10-CM | POA: Diagnosis present

## 2015-06-18 DIAGNOSIS — R079 Chest pain, unspecified: Secondary | ICD-10-CM | POA: Diagnosis not present

## 2015-06-18 DIAGNOSIS — Z825 Family history of asthma and other chronic lower respiratory diseases: Secondary | ICD-10-CM | POA: Diagnosis not present

## 2015-06-18 DIAGNOSIS — I5042 Chronic combined systolic (congestive) and diastolic (congestive) heart failure: Secondary | ICD-10-CM | POA: Diagnosis not present

## 2015-06-18 DIAGNOSIS — F17210 Nicotine dependence, cigarettes, uncomplicated: Secondary | ICD-10-CM | POA: Diagnosis not present

## 2015-06-18 DIAGNOSIS — Z833 Family history of diabetes mellitus: Secondary | ICD-10-CM | POA: Diagnosis not present

## 2015-06-18 DIAGNOSIS — Z794 Long term (current) use of insulin: Secondary | ICD-10-CM | POA: Diagnosis not present

## 2015-06-18 DIAGNOSIS — K3184 Gastroparesis: Secondary | ICD-10-CM | POA: Diagnosis not present

## 2015-06-18 DIAGNOSIS — Z87891 Personal history of nicotine dependence: Secondary | ICD-10-CM | POA: Diagnosis not present

## 2015-06-18 DIAGNOSIS — E669 Obesity, unspecified: Secondary | ICD-10-CM | POA: Diagnosis not present

## 2015-06-18 DIAGNOSIS — Z823 Family history of stroke: Secondary | ICD-10-CM | POA: Diagnosis not present

## 2015-06-18 DIAGNOSIS — G4730 Sleep apnea, unspecified: Secondary | ICD-10-CM | POA: Diagnosis present

## 2015-06-18 DIAGNOSIS — I10 Essential (primary) hypertension: Secondary | ICD-10-CM | POA: Diagnosis not present

## 2015-06-18 DIAGNOSIS — J441 Chronic obstructive pulmonary disease with (acute) exacerbation: Principal | ICD-10-CM | POA: Diagnosis present

## 2015-06-18 DIAGNOSIS — D179 Benign lipomatous neoplasm, unspecified: Secondary | ICD-10-CM | POA: Diagnosis present

## 2015-06-18 DIAGNOSIS — Z95810 Presence of automatic (implantable) cardiac defibrillator: Secondary | ICD-10-CM | POA: Diagnosis present

## 2015-06-18 DIAGNOSIS — I428 Other cardiomyopathies: Secondary | ICD-10-CM

## 2015-06-18 DIAGNOSIS — E1143 Type 2 diabetes mellitus with diabetic autonomic (poly)neuropathy: Secondary | ICD-10-CM | POA: Diagnosis present

## 2015-06-18 DIAGNOSIS — Z6841 Body Mass Index (BMI) 40.0 and over, adult: Secondary | ICD-10-CM | POA: Diagnosis not present

## 2015-06-18 DIAGNOSIS — Z7982 Long term (current) use of aspirin: Secondary | ICD-10-CM | POA: Diagnosis not present

## 2015-06-18 DIAGNOSIS — Z8249 Family history of ischemic heart disease and other diseases of the circulatory system: Secondary | ICD-10-CM

## 2015-06-18 LAB — CBC
HCT: 42.5 % (ref 36.0–46.0)
Hemoglobin: 13.6 g/dL (ref 12.0–15.0)
MCH: 27.2 pg (ref 26.0–34.0)
MCHC: 32 g/dL (ref 30.0–36.0)
MCV: 85 fL (ref 78.0–100.0)
Platelets: 236 10*3/uL (ref 150–400)
RBC: 5 MIL/uL (ref 3.87–5.11)
RDW: 16 % — ABNORMAL HIGH (ref 11.5–15.5)
WBC: 9.1 10*3/uL (ref 4.0–10.5)

## 2015-06-18 LAB — GLUCOSE, CAPILLARY
Glucose-Capillary: 585 mg/dL (ref 65–99)
Glucose-Capillary: 547 mg/dL — ABNORMAL HIGH (ref 65–99)

## 2015-06-18 LAB — CBG MONITORING, ED: Glucose-Capillary: 451 mg/dL — ABNORMAL HIGH (ref 65–99)

## 2015-06-18 LAB — BASIC METABOLIC PANEL
Anion gap: 9 (ref 5–15)
BUN: 8 mg/dL (ref 6–20)
CO2: 32 mmol/L (ref 22–32)
Calcium: 8.9 mg/dL (ref 8.9–10.3)
Chloride: 99 mmol/L — ABNORMAL LOW (ref 101–111)
Creatinine, Ser: 0.87 mg/dL (ref 0.44–1.00)
GFR calc Af Amer: >60 mL/min (ref >60)
GFR calc non Af Amer: >60 mL/min (ref >60)
Glucose, Bld: 321 mg/dL — ABNORMAL HIGH (ref 65–99)
Potassium: 3.6 mmol/L (ref 3.5–5.1)
Sodium: 140 mmol/L (ref 135–145)

## 2015-06-18 LAB — I-STAT TROPONIN, ED: Troponin i, poc: 0.02 ng/mL (ref 0.00–0.08)

## 2015-06-18 LAB — BRAIN NATRIURETIC PEPTIDE: B Natriuretic Peptide: 20.8 pg/mL (ref 0.0–100.0)

## 2015-06-18 MED ORDER — INSULIN ASPART 100 UNIT/ML ~~LOC~~ SOLN
0.0000 [IU] | Freq: Three times a day (TID) | SUBCUTANEOUS | Status: DC
  Administered 2015-06-18: 15 [IU] via SUBCUTANEOUS
  Administered 2015-06-19: 11 [IU] via SUBCUTANEOUS
  Administered 2015-06-19 (×2): 15 [IU] via SUBCUTANEOUS
  Filled 2015-06-18: qty 1

## 2015-06-18 MED ORDER — ALBUTEROL (5 MG/ML) CONTINUOUS INHALATION SOLN
10.0000 mg | INHALATION_SOLUTION | Freq: Once | RESPIRATORY_TRACT | Status: AC
  Administered 2015-06-18: 10 mg via RESPIRATORY_TRACT

## 2015-06-18 MED ORDER — ATORVASTATIN CALCIUM 80 MG PO TABS
80.0000 mg | ORAL_TABLET | Freq: Every day | ORAL | Status: DC
  Administered 2015-06-18: 80 mg via ORAL
  Filled 2015-06-18: qty 1

## 2015-06-18 MED ORDER — METOPROLOL SUCCINATE ER 50 MG PO TB24
50.0000 mg | ORAL_TABLET | Freq: Every day | ORAL | Status: DC
Start: 2015-06-18 — End: 2015-06-19
  Administered 2015-06-18 – 2015-06-19 (×2): 50 mg via ORAL
  Filled 2015-06-18 (×2): qty 1

## 2015-06-18 MED ORDER — LISINOPRIL 40 MG PO TABS
40.0000 mg | ORAL_TABLET | Freq: Every day | ORAL | Status: DC
  Administered 2015-06-18 – 2015-06-19 (×2): 40 mg via ORAL
  Filled 2015-06-18 (×2): qty 1

## 2015-06-18 MED ORDER — ASPIRIN EC 81 MG PO TBEC
81.0000 mg | DELAYED_RELEASE_TABLET | Freq: Every day | ORAL | Status: DC
  Administered 2015-06-18 – 2015-06-19 (×2): 81 mg via ORAL
  Filled 2015-06-18: qty 1

## 2015-06-18 MED ORDER — GABAPENTIN 300 MG PO CAPS
300.0000 mg | ORAL_CAPSULE | Freq: Every day | ORAL | Status: DC
  Administered 2015-06-18: 300 mg via ORAL
  Filled 2015-06-18: qty 1

## 2015-06-18 MED ORDER — FUROSEMIDE 40 MG PO TABS
40.0000 mg | ORAL_TABLET | Freq: Every day | ORAL | Status: DC
  Administered 2015-06-19: 40 mg via ORAL
  Filled 2015-06-18: qty 1

## 2015-06-18 MED ORDER — INSULIN ASPART 100 UNIT/ML ~~LOC~~ SOLN
3.0000 [IU] | Freq: Three times a day (TID) | SUBCUTANEOUS | Status: DC
Start: 2015-06-18 — End: 2015-06-19
  Administered 2015-06-18 – 2015-06-19 (×2): 3 [IU] via SUBCUTANEOUS
  Filled 2015-06-18: qty 1

## 2015-06-18 MED ORDER — IPRATROPIUM-ALBUTEROL 0.5-2.5 (3) MG/3ML IN SOLN
3.0000 mL | RESPIRATORY_TRACT | Status: DC
  Administered 2015-06-18 – 2015-06-19 (×7): 3 mL via RESPIRATORY_TRACT
  Filled 2015-06-18 (×7): qty 3

## 2015-06-18 MED ORDER — INSULIN ASPART 100 UNIT/ML ~~LOC~~ SOLN
10.0000 [IU] | Freq: Once | SUBCUTANEOUS | Status: AC
  Administered 2015-06-18: 10 [IU] via SUBCUTANEOUS

## 2015-06-18 MED ORDER — INSULIN ASPART 100 UNIT/ML ~~LOC~~ SOLN: 0.0000 [IU] | Freq: Every day | SUBCUTANEOUS | Status: DC

## 2015-06-18 MED ORDER — ENOXAPARIN SODIUM 40 MG/0.4ML ~~LOC~~ SOLN
40.0000 mg | SUBCUTANEOUS | Status: DC
  Administered 2015-06-18: 40 mg via SUBCUTANEOUS
  Filled 2015-06-18: qty 0.4

## 2015-06-18 MED ORDER — DOXYCYCLINE HYCLATE 100 MG PO TABS
100.0000 mg | ORAL_TABLET | Freq: Two times a day (BID) | ORAL | Status: DC
Start: 2015-06-18 — End: 2015-06-19
  Administered 2015-06-18 – 2015-06-19 (×2): 100 mg via ORAL
  Filled 2015-06-18 (×2): qty 1

## 2015-06-18 MED ORDER — METHYLPREDNISOLONE SODIUM SUCC 125 MG IJ SOLR
125.0000 mg | Freq: Once | INTRAMUSCULAR | Status: AC
  Administered 2015-06-18: 125 mg via INTRAVENOUS
  Filled 2015-06-18: qty 2

## 2015-06-18 MED ORDER — PREDNISONE 20 MG PO TABS
40.0000 mg | ORAL_TABLET | Freq: Every day | ORAL | Status: DC
  Administered 2015-06-19: 40 mg via ORAL
  Filled 2015-06-18: qty 2

## 2015-06-18 MED ORDER — INSULIN GLARGINE 100 UNIT/ML ~~LOC~~ SOLN
30.0000 [IU] | Freq: Every day | SUBCUTANEOUS | Status: DC
  Administered 2015-06-19: 30 [IU] via SUBCUTANEOUS
  Filled 2015-06-18 (×2): qty 0.3

## 2015-06-18 MED ORDER — ALBUTEROL (5 MG/ML) CONTINUOUS INHALATION SOLN
10.0000 mg/h | INHALATION_SOLUTION | Freq: Once | RESPIRATORY_TRACT | Status: AC
  Administered 2015-06-18: 10 mg/h via RESPIRATORY_TRACT
  Filled 2015-06-18: qty 20

## 2015-06-18 MED ORDER — IPRATROPIUM BROMIDE 0.02 % IN SOLN
0.5000 mg | Freq: Once | RESPIRATORY_TRACT | Status: AC
  Administered 2015-06-18: 0.5 mg via RESPIRATORY_TRACT
  Filled 2015-06-18: qty 2.5

## 2015-06-18 MED ORDER — NITROGLYCERIN 0.4 MG SL SUBL: 0.4000 mg | SUBLINGUAL_TABLET | SUBLINGUAL | Status: DC | PRN

## 2015-06-18 MED ORDER — AMLODIPINE BESYLATE 10 MG PO TABS
10.0000 mg | ORAL_TABLET | Freq: Every day | ORAL | Status: DC
  Administered 2015-06-18 – 2015-06-19 (×2): 10 mg via ORAL
  Filled 2015-06-18 (×2): qty 1

## 2015-06-18 NOTE — Telephone Encounter (Signed)
Called pt and informed she will need to talk with PCP for pain med. She has a scheduled appointment next week. She agrees. Pt states she is SOB today, do to leg pain.  She wants to be seen for SOB. Scheduled.to 1045 today

## 2015-06-18 NOTE — Progress Notes (Signed)
Pt CBG 585.  No s/s of hyperglycemia.  Family at bedside and daughter stated pt has had sprite x3.  MD notified, new order of Novolog 10u sq x1 dose.  Medication given.

## 2015-06-18 NOTE — ED Notes (Addendum)
Dr. Earma Reading called back to inform RN that pt could eat a meal than then have the full 18 units.  Pt given meal bag (sandwhich, applesause and Sprite one.)

## 2015-06-18 NOTE — Progress Notes (Signed)
Recheck of CBG after 10 units novolog insulin given, CBG 547 with no s/s of hyperglycemia.  MD notified again, no new orders, recheck cbg in 4 hrs.  Nsg to continue to monitor for status changes.

## 2015-06-18 NOTE — ED Notes (Signed)
Paged provider to inform them of CBG

## 2015-06-18 NOTE — ED Notes (Signed)
Glucose level 451. Rn Aware.

## 2015-06-18 NOTE — H&P (Signed)
Date: 06/18/2015               Patient Name:  [name] MRN: [id number]  DOB: 1951/01/20 Age / Sex: 64 y.o., female   PCP: Vickii Chafe, MD         Medical Service: Internal Medicine Teaching Service         Attending Physician: Dr. Aldine Contes, MD    First Contact: Dr. Jule Ser Pager: [phone]  Second Contact: Dr. Dellia Nims Pager: [phone]       After Hours (After 5p/  First Contact Pager: [phone]  weekends / holidays): Second Contact Pager: [phone]   Chief Complaint: shortness of breath, cough  History of Present Illness: [name] is a 64 y.o. female with past medical history of COPD, combined CHF (EF 35-40% in May 2015), HTN, HLD, diabetes mellitus type II who presents to the emergency department complaining of 2 weeks of congestion and cough.  States that she has had a dry cough that is worsening and is associated with worsening shortness of breath, prompting her to come to the emergency department.  States today she has been more short of breath than normal and is having symptoms at rest, which is increased for her.  She has been using 2L home oxygen at night and nebulizer treatments at home to try and relieve her symptoms but has not had much success with this.  She denies any fever, chills, nausea, vomiting, diarrhea, or urinary complaints.  She does endorse PND and orthopnea that are unchanged from previous.  She is a former smoker with about 1/2 PPD history for 35 years.  She also complains of pain at the site of recurrent lipoma on the lateral aspect of her right thigh.   In the ED, patient had chest xray with no acute cardiopulmonary process.  She initially had hypoxia in the low 60's that was easily corrected on non-rebreather.  She did very well after a 2 hour long albuterol treatment but desaturated to the low 80's with ambulation.  She was therefore admitted.  Meds: Current Facility-Administered Medications  Medication Dose Route Frequency  Provider Last Rate Last Dose  . doxycycline (VIBRA-TABS) tablet 100 mg  100 mg Oral Q12H Tasrif Ahmed, MD   100 mg at 06/18/15 1733  . insulin aspart (novoLOG) injection 0-15 Units  0-15 Units Subcutaneous TID WC Dellia Nims, MD   15 Units at 06/18/15 1828  . insulin aspart (novoLOG) injection 0-5 Units  0-5 Units Subcutaneous QHS Tasrif Ahmed, MD      . insulin aspart (novoLOG) injection 3 Units  3 Units Subcutaneous TID WC Tasrif Ahmed, MD   3 Units at 06/18/15 1830  . ipratropium-albuterol (DUONEB) 0.5-2.5 (3) MG/3ML nebulizer solution 3 mL  3 mL Nebulization Q4H Tasrif Ahmed, MD   3 mL at 06/18/15 1735  . [START ON 06/19/2015] predniSONE (DELTASONE) tablet 40 mg  40 mg Oral Q breakfast Dellia Nims, MD       Current Outpatient Prescriptions  Medication Sig Dispense Refill  . amLODipine (NORVASC) 10 MG tablet TAKE 1 TABLET BY MOUTH DAILY 30 tablet 11  . aspirin EC 81 MG tablet Take 81 mg by mouth daily.    [name] atorvastatin (LIPITOR) 80 MG tablet Take 1 tablet (80 mg total) by mouth at bedtime. 90 tablet 3  . furosemide (LASIX) 20 MG tablet Take 2 tablets (40 mg total) by mouth daily. 60 tablet 3  . gabapentin (NEURONTIN) 300 MG capsule Take  1 capsule (300 mg total) by mouth at bedtime. 30 capsule 11  . insulin glargine (LANTUS) 100 UNIT/ML injection Inject 0.4 mLs (40 Units total) into the skin daily with breakfast. 20 mL 3  . insulin lispro (HUMALOG KWIKPEN) 100 UNIT/ML KiwkPen Inject 5 units with breakfast and 5 units with dinner, hold if CBGs low or not eating and call MD 15 mL 3  . lisinopril (PRINIVIL,ZESTRIL) 40 MG tablet Take 1 tablet (40 mg total) by mouth daily. 90 tablet 0  . metoprolol succinate (TOPROL-XL) 50 MG 24 hr tablet Take 1 tablet (50 mg total) by mouth daily. 90 tablet 3  . albuterol (VENTOLIN HFA) 108 (90 BASE) MCG/ACT inhaler Inhale 2 puffs into the lungs every 6 (six) hours as needed. Shortness of breath 1 Inhaler 6  . glucose blood (ONE TOUCH ULTRA TEST) test strip Use  as instructed by doctor up to 3x daily,dx code 250.02 insulin requiring 100 each 5  . HYDROcodone-acetaminophen (NORCO/VICODIN) 5-325 MG per tablet Take 1 tablet by mouth at bedtime as needed for moderate pain.   0  . nitroGLYCERIN (NITROSTAT) 0.4 MG SL tablet Place 0.4 mg under the tongue every 5 (five) minutes as needed for chest pain.    [name] omeprazole (PRILOSEC) 20 MG capsule Take one tablet daily 30 minutes before breakfast (Patient not taking: Reported on 06/18/2015) 30 capsule 2  . ondansetron (ZOFRAN-ODT) 8 MG disintegrating tablet Take 8 mg by mouth every 8 (eight) hours as needed for nausea or vomiting.    . polyethylene glycol powder (GLYCOLAX/MIRALAX) powder Take 255 g by mouth once. (Patient not taking: Reported on 06/18/2015) 255 g 0    Allergies: Allergies as of 06/18/2015 - Review Complete 06/18/2015  Allergen Reaction Noted  . Actos [pioglitazone]  03/12/2009  . Avandia [rosiglitazone maleate]  04/13/2011  . Hydrocortisone  10/06/2009   Past Medical History  Diagnosis Date  . Hyperthyroidism, subclinical   . Post menopausal syndrome   . Obesity   . Cardiac defibrillator in situ     Atlas II VR (SJM) implanted by Dr Lovena Le  . Eczema   . Lipoma   . Chronic ulcer of leg     04-09-15 resolved-not a problem.  . Hyperlipidemia   . Chronic combined systolic and diastolic heart failure     a. EF 35-40% in past;  b. Echo 7/13:  EF 45-50%, Gr 2 diast dysfn, mild AI, mild MAC, trivial MR, mild LAE, PASP 47.  [name] NICM (nonischemic cardiomyopathy)   . Diabetes mellitus   . HTN (hypertension)   . Elevated alkaline phosphatase level     GGT and 5'nucleotidase 8/13 normal  . Hx of cardiac cath     a. [place] 2003 normal;  b. LHC 6/13:  Mild calcification in the LM, o/w normal coronary arteries, EF 45%.   . Sleep apnea     pt denies 04/12/2013  . COPD (chronic obstructive pulmonary disease)     O2 at night  . Automatic implantable cardioverter-defibrillator in situ   . CHF (congestive  heart failure)   . Depression   . Implantable cardioverter-defibrillator (ICD) generator end of life   . Colon polyps 04/12/2013    Rectosigmoid polyp  . History of oxygen administration     oxygen @ 2 l/m nasally bedtime 24/7  . ATN (acute tubular necrosis) 07/15/2014   Past Surgical History  Procedure Laterality Date  . Cardiac defibrillator placement  05/04/2007    SJM Atlas II VR ICD  . Hysteroscopy    .  Cardiac defibrillator placement    . Abdominal hysterectomy    . Cardiac catheterization    . Insert / replace / remove pacemaker    . Tubal ligation    . Hernia repair    . Colonoscopy N/A 04/12/2013    Procedure: COLONOSCOPY;  Surgeon: Beryle Beams, MD;  Location: [facility] ENDOSCOPY;  Service: Endoscopy;  Laterality: N/A;  pt.has defibrilator  . Left heart catheterization with coronary angiogram N/A 04/02/2012    Procedure: LEFT HEART CATHETERIZATION WITH CORONARY ANGIOGRAM;  Surgeon: Hillary Bow, MD;  Location: [facility] CATH LAB;  Service: Cardiovascular;  Laterality: N/A;  . Implantable cardioverter defibrillator (icd) generator change N/A 04/02/2014    Procedure: ICD GENERATOR CHANGE;  Surgeon: Evans Lance, MD;  Location: [facility] CATH LAB;  Service: Cardiovascular;  Laterality: N/A;  . Esophagogastroduodenoscopy N/A 04/16/2015    Procedure: ESOPHAGOGASTRODUODENOSCOPY (EGD);  Surgeon: [name], MD;  Location: [facility];  Service: Endoscopy;  Laterality: N/A;  . Colonoscopy N/A 04/16/2015    Procedure: COLONOSCOPY;  Surgeon: [name], MD;  Location: [facility] ENDOSCOPY;  Service: Endoscopy;  Laterality: N/A;   Family History  Problem Relation Age of Onset  . Stroke Mother   . Seizures Father   . Diabetes Sister   . Asthma Maternal Aunt     aunts  . Asthma Maternal Uncle     uncles  . Heart disease Father   . Heart disease Paternal Aunt     aunts  . Heart disease Paternal Uncle     uncles  . Heart disease Maternal Aunt     aunts  . Heart disease Maternal Uncle      uncles  . Heart disease Maternal Grandfather   . Colon cancer Neg Hx   . Colon polyps Neg Hx   . Esophageal cancer Neg Hx   . Kidney disease Neg Hx   . Gallbladder disease Neg Hx    Social History   Social History  . Marital Status: Widowed    Spouse Name: Alroy Dust  . Number of Children: 3  . Years of Education: 11   Occupational History  . disabled    Social History Main Topics  . Smoking status: Heavy Tobacco Smoker -- 0.50 packs/day for 35 years    Types: Cigarettes  . Smokeless tobacco: Never Used     Comment: currently smoking 3-4 cigs daily or less./ 1 pack every 3 days  . Alcohol Use: 0.6 oz/week    1 Glasses of wine per week     Comment: Occassionally -weekends-wine  . Drug Use: No  . Sexual Activity: Not on file   Other Topics Concern  . Not on file   Social History Narrative   ** Merged History Encounter **       Married    Review of Systems: Review of Systems  Constitutional: Negative for fever and chills.  HENT: Positive for congestion.   Eyes: Negative for blurred vision.  Respiratory: Positive for cough and shortness of breath. Negative for hemoptysis and sputum production.   Cardiovascular: Positive for orthopnea, leg swelling and PND. Negative for chest pain and palpitations.  Gastrointestinal: Negative for nausea, vomiting, abdominal pain and diarrhea.  Genitourinary: Negative for dysuria and frequency.  Musculoskeletal:       Leg pain at lipoma site  Neurological: Negative for dizziness and focal weakness.  All other systems reviewed and are negative.    Physical Exam: Blood pressure 154/72, pulse 95, temperature 97.9 F (36.6 C), temperature  source Oral, resp. rate 18, height 5' (1.524 m), weight 234 lb 1.6 oz (106.187 kg), SpO2 94 %.  Physical Exam  Constitutional: She is oriented to person, place, and time. She appears well-developed and well-nourished.  Eyes: EOM are normal.  Neck: Normal range of motion. No JVD present.    Cardiovascular: Normal rate, regular rhythm, normal heart sounds and intact distal pulses.   Pulmonary/Chest:  Coarse breath sounds throughout with scattered wheezes and rales  Abdominal: Soft. Bowel sounds are normal. There is no tenderness.  Musculoskeletal: She exhibits edema (+1 pitting edema of lower extremity) and tenderness.  Neurological: She is alert and oriented to person, place, and time.  Skin: Skin is warm and dry. She is not diaphoretic.     Lab results: Basic Metabolic Panel:  Recent Labs  06/18/15 1042  NA 140  K 3.6  CL 99*  CO2 32  GLUCOSE 321*  BUN 8  CREATININE 0.87  CALCIUM 8.9   CBC:  Recent Labs  06/18/15 1042  WBC 9.1  HGB 13.6  HCT 42.5  MCV 85.0  PLT 236   CBG:  Recent Labs  06/18/15 1714  GLUCAP 451*   Urine Drug Screen: Drugs of Abuse     Component Value Date/Time   LABOPIA NONE DETECTED 05/15/2014 1141   COCAINSCRNUR NONE DETECTED 05/15/2014 1141   LABBENZ NONE DETECTED 05/15/2014 1141   AMPHETMU NONE DETECTED 05/15/2014 1141   THCU NONE DETECTED 05/15/2014 1141   LABBARB NONE DETECTED 05/15/2014 1141     Imaging results:  Dg Chest Portable 1 View  06/18/2015   CLINICAL DATA:  Shortness of breath.  EXAM: PORTABLE CHEST - 1 VIEW  COMPARISON:  April 08, 2015.  FINDINGS: Stable cardiomegaly. Left-sided pacemaker is unchanged in position. No pneumothorax or pleural effusion is noted. No acute pulmonary disease is noted. Bony thorax is intact.  IMPRESSION: No acute cardiopulmonary abnormality seen.   Electronically Signed   By: Marijo Conception, M.D.   On: 06/18/2015 11:30    Other results: EKG: Sinus rhythm, Right atrial enlargement, Nonspecific T abnormalities, lateral leads, Prolonged QT interval  Assessment & Plan by Problem: Active Problems:   Body mass index (BMI) of 45.0-49.9 in adult   HYPERTENSION, BENIGN ESSENTIAL   Chronic combined systolic and diastolic heart failure   Automatic implantable  cardioverter-defibrillator in situ   Nonischemic cardiomyopathy   Diabetic gastroparesis associated with type 2 diabetes mellitus   COPD exacerbation  64 y.o. female with past medical history of COPD, combined CHF (EF 35-40% in May 2015), HTN, HLD, diabetes mellitus type II who presents to the emergency department complaining of 2 weeks of congestion and cough.   COPD Exacerbation: increased shortness of breath, dry cough with no increased sputum production or purulence can be classified as mild exacerbation with good response to bronchodilaters.  Less concern for pneumonia given no leukocytosis, no fever, no chills, normal chest xray.  CHF exacerbation less likely as well with normal BNP, chest xray.  Troponin negative in the ED.  Given albuterol nebs and Solumedrol in the ED with good response. -Duonebs q4h -prednisone 40mg  daily -doxycycline 100mg  q12h  HTN: home meds are amlodipine 10mg  daily, Lasix 40mg  daily, lisinopril 40mg  daily, Toprol XL 50mg  daily -continue home meds  HLD: home meds are atorvastatin 80mg  daily and aspirin 81mg  daily -continue home meds  Combined CHF -stable, BNP on admission of 20.8 -continue home meds  Diabetes mellitus type II: home meds are Lantus 30 units at breakfast,  Humalog 5 units w/ breakfast and dinner, gabapentin 300mg  qhs -continue Lantus, gabapentin -SSI - moderate with HS coverage -check Hgb A1c -CBG monitoring  Lipoma of thigh: recurrence of previously resected lipoma on the lateral aspect of the right thigh.  Unlikely to be cellulitis because there is no erythema or warmth to the area.  Unlikely to be abscess as well because area is only mildly tender to palpation.  CBC without leukocytosis. -tylenol for pain -has appointment with surgery later this month  Dispo: Disposition is deferred at this time, awaiting improvement of current medical problems.   The patient does have a current PCP (Alexa Sherral Hammers, MD) and does need an [facility]  hospital follow-up appointment after discharge.  The patient does not have transportation limitations that hinder transportation to clinic appointments.  Signed: Jule Ser, DO 06/18/2015, 6:41 PM

## 2015-06-18 NOTE — ED Notes (Signed)
Pt transitioned to 3L [place].  O2 sat remains at 99%

## 2015-06-18 NOTE — ED Notes (Signed)
Provider called back and instructed RN to give max insulin per sliding scale.

## 2015-06-18 NOTE — ED Notes (Addendum)
Pt ambulated to in the hallway with pulse ox, heart rate 110 o2 dropped to 83. Pt taken back to the room and placed on nasal canula, o2 back up to 94 2L. Dr. Mingo Amber aware.

## 2015-06-18 NOTE — ED Provider Notes (Signed)
CSN: [id number]     Arrival date & time 06/18/15  1019 History   First MD Initiated Contact with Patient 06/18/15 1020     Chief Complaint  Patient presents with  . Shortness of Breath     (Consider location/radiation/quality/duration/timing/severity/associated sxs/prior Treatment) Patient is a 64 y.o. female presenting with shortness of breath. The history is provided by the patient.  Shortness of Breath Severity:  Moderate Onset quality:  Gradual Duration:  2 weeks Timing:  Constant Progression:  Unchanged Chronicity:  Recurrent Context: not URI   Relieved by: breathing treatments. Worsened by:  Nothing tried Associated symptoms: chest pain and cough   Associated symptoms: no abdominal pain, no fever and no vomiting     Past Medical History  Diagnosis Date  . Hyperthyroidism, subclinical   . Post menopausal syndrome   . Obesity   . Cardiac defibrillator in situ     Atlas II VR (SJM) implanted by Dr Lovena Le  . Eczema   . Lipoma   . Chronic ulcer of leg     04-09-15 resolved-not a problem.  . Hyperlipidemia   . Chronic combined systolic and diastolic heart failure     a. EF 35-40% in past;  b. Echo 7/13:  EF 45-50%, Gr 2 diast dysfn, mild AI, mild MAC, trivial MR, mild LAE, PASP 47.  [name] NICM (nonischemic cardiomyopathy)   . Diabetes mellitus   . HTN (hypertension)   . Elevated alkaline phosphatase level     GGT and 5'nucleotidase 8/13 normal  . Hx of cardiac cath     a. [place] 2003 normal;  b. LHC 6/13:  Mild calcification in the LM, o/w normal coronary arteries, EF 45%.   . Sleep apnea     pt denies 04/12/2013  . COPD (chronic obstructive pulmonary disease)     O2 at night  . Automatic implantable cardioverter-defibrillator in situ   . CHF (congestive heart failure)   . Depression   . Implantable cardioverter-defibrillator (ICD) generator end of life   . Colon polyps 04/12/2013    Rectosigmoid polyp  . History of oxygen administration     oxygen @ 2 l/m nasally  bedtime 24/7  . ATN (acute tubular necrosis) 07/15/2014   Past Surgical History  Procedure Laterality Date  . Cardiac defibrillator placement  05/04/2007    SJM Atlas II VR ICD  . Hysteroscopy    . Cardiac defibrillator placement    . Abdominal hysterectomy    . Cardiac catheterization    . Insert / replace / remove pacemaker    . Tubal ligation    . Hernia repair    . Colonoscopy N/A 04/12/2013    Procedure: COLONOSCOPY;  Surgeon: Beryle Beams, MD;  Location: [facility] ENDOSCOPY;  Service: Endoscopy;  Laterality: N/A;  pt.has defibrilator  . Left heart catheterization with coronary angiogram N/A 04/02/2012    Procedure: LEFT HEART CATHETERIZATION WITH CORONARY ANGIOGRAM;  Surgeon: Hillary Bow, MD;  Location: [facility] CATH LAB;  Service: Cardiovascular;  Laterality: N/A;  . Implantable cardioverter defibrillator (icd) generator change N/A 04/02/2014    Procedure: ICD GENERATOR CHANGE;  Surgeon: Evans Lance, MD;  Location: [facility] C[facility] LAB;  Service: Cardiovascular;  Laterality: N/A;  . Esophagogastroduodenoscopy N/A 04/16/2015    Procedure: ESOPHAGOGASTRODUODENOSCOPY (EGD);  Surgeon: [name], MD;  Location: [facility];  Service: Endoscopy;  Laterality: N/A;  . Colonoscopy N/A 04/16/2015    Procedure: COLONOSCOPY;  Surgeon: [name], MD;  Location: [facility] ENDOSCOPY;  Service: Endoscopy;  Laterality: N/A;   Family History  Problem Relation Age of Onset  . Stroke Mother   . Seizures Father   . Diabetes Sister   . Asthma Maternal Aunt     aunts  . Asthma Maternal Uncle     uncles  . Heart disease Father   . Heart disease Paternal Aunt     aunts  . Heart disease Paternal Uncle     uncles  . Heart disease Maternal Aunt     aunts  . Heart disease Maternal Uncle     uncles  . Heart disease Maternal Grandfather   . Colon cancer Neg Hx   . Colon polyps Neg Hx   . Esophageal cancer Neg Hx   . Kidney disease Neg Hx   . Gallbladder disease Neg Hx    Social History  Substance Use  Topics  . Smoking status: Heavy Tobacco Smoker -- 0.50 packs/day for 35 years    Types: Cigarettes  . Smokeless tobacco: Never Used     Comment: currently smoking 3-4 cigs daily or less./ 1 pack every 3 days  . Alcohol Use: 0.6 oz/week    1 Glasses of wine per week     Comment: Occassionally -weekends-wine   OB History    No data available     Review of Systems  Constitutional: Negative for fever and chills.  Respiratory: Positive for cough. Negative for shortness of breath.   Cardiovascular: Positive for chest pain. Negative for leg swelling.  Gastrointestinal: Negative for vomiting and abdominal pain.  All other systems reviewed and are negative.     Allergies  Actos; Avandia; and Hydrocortisone  Home Medications   Prior to Admission medications   Medication Sig Start Date End Date Taking? Authorizing Provider  albuterol (VENTOLIN HFA) 108 (90 BASE) MCG/ACT inhaler Inhale 2 puffs into the lungs every 6 (six) hours as needed. Shortness of breath 07/09/13   Wilber Oliphant, MD  amLODipine (NORVASC) 10 MG tablet TAKE 1 TABLET BY MOUTH DAILY 05/15/15   Alexa Sherral Hammers, MD  aspirin EC 81 MG tablet Take 81 mg by mouth daily.    Historical Provider, MD  atorvastatin (LIPITOR) 80 MG tablet Take 1 tablet (80 mg total) by mouth at bedtime. 01/14/15   Lucious Groves, DO  furosemide (LASIX) 20 MG tablet Take 2 tablets (40 mg total) by mouth daily. 03/26/15   Sid Falcon, MD  gabapentin (NEURONTIN) 300 MG capsule Take 1 capsule (300 mg total) by mouth at bedtime. 07/04/14   Nischal Narendra, MD  glucose blood (ONE TOUCH ULTRA TEST) test strip Use as instructed by doctor up to 3x daily,dx code 250.02 insulin requiring 05/20/13   Wilber Oliphant, MD  HYDROcodone-acetaminophen (NORCO/VICODIN) 5-325 MG per tablet Take 1 tablet by mouth at bedtime as needed for moderate pain.  01/28/15   Historical Provider, MD  insulin glargine (LANTUS) 100 UNIT/ML injection Inject 0.4 mLs (40 Units total)  into the skin daily with breakfast. 02/09/15   Wilber Oliphant, MD  insulin lispro (HUMALOG KWIKPEN) 100 UNIT/ML KiwkPen Inject 5 units with breakfast and 5 units with dinner, hold if CBGs low or not eating and call MD 02/09/15   Wilber Oliphant, MD  lisinopril (PRINIVIL,ZESTRIL) 40 MG tablet Take 1 tablet (40 mg total) by mouth daily. 06/02/15   Shela Leff, MD  metoprolol succinate (TOPROL-XL) 50 MG 24 hr tablet Take 1 tablet (50 mg total) by mouth daily. 05/15/15   Alexa Sherral Hammers, MD  nitroGLYCERIN (NITROSTAT) 0.4 MG SL tablet Place 0.4 mg under the tongue every 5 (five) minutes as needed for chest pain.    Historical Provider, MD  omeprazole (PRILOSEC) 20 MG capsule Take one tablet daily 30 minutes before breakfast 03/03/15   Amy S Esterwood, PA-C  ondansetron (ZOFRAN-ODT) 8 MG disintegrating tablet Take 8 mg by mouth every 8 (eight) hours as needed for nausea or vomiting.    Historical Provider, MD  polyethylene glycol powder (GLYCOLAX/MIRALAX) powder Take 255 g by mouth once. 03/20/15   Amy S Esterwood, PA-C   SpO2 100% Physical Exam  Constitutional: She is oriented to person, place, and time. She appears well-developed and well-nourished. No distress.  HENT:  Head: Normocephalic and atraumatic.  Mouth/Throat: Oropharynx is clear and moist.  Eyes: EOM are normal. Pupils are equal, round, and reactive to light.  Neck: Normal range of motion. Neck supple.  Cardiovascular: Normal rate and regular rhythm.  Exam reveals no friction rub.   No murmur heard. Pulmonary/Chest: Effort normal and breath sounds normal. No respiratory distress. She has no wheezes. She has no rales.  Abdominal: Soft. She exhibits no distension. There is no tenderness. There is no rebound.  Musculoskeletal: Normal range of motion. She exhibits no edema.  Neurological: She is alert and oriented to person, place, and time. No cranial nerve deficit. She exhibits normal muscle tone. Coordination normal.  Skin: No rash  noted. She is not diaphoretic.  Nursing note and vitals reviewed.   ED Course  Procedures (including critical care time) Labs Review Labs Reviewed  [place], ED    Imaging Review No results found. I have personally reviewed and evaluated these images and lab results as part of my medical decision-making.   EKG Interpretation   Date/Time:  Thursday June 18 2015 10:27:48 EDT Ventricular Rate:  92 PR Interval:  155 QRS Duration: 95 QT Interval:  419 QTC Calculation: 518 R Axis:   68 Text Interpretation:  Sinus rhythm Right atrial enlargement Nonspecific T  abnormalities, lateral leads Prolonged QT interval Flipped T waves seen on  prior Confirmed by Mingo Amber  MD, [place] ([id number]) on 06/18/2015 10:35:28 AM      MDM   Final diagnoses:  COPD exacerbation    64 year old female with history of COPD, CHF presents with shortness of breath for 2 weeks. Has had a mild dry cough also. No fevers or chills. Patient presents for shortness of breath and hypoxia down in the low 60s. She corrected easily on nonrebreather and option is having back down to 10 L with good response. She has diffusely decreased air movement with coarse wheezes throughout. No lower extremity swelling. We'll check labs, will check labs and start continuous albuterol treatments.  Doing better after 2 hour-long albuterol treatments. Patient desatted to the low 80s with ambulation. Will admit.    Evelina Bucy, MD 06/18/15 [phone]

## 2015-06-18 NOTE — ED Notes (Signed)
Paged provider to inquire about insulin administration and NPO status

## 2015-06-18 NOTE — ED Notes (Signed)
Pt c/o SOB x2 weeks.  Pt has O2 at night- 2L.  Pt was 61% on RA placed on NRB and [place] MD made aware and is at bedside.  Pt O2 sat is 99% on 10L NRB.

## 2015-06-18 NOTE — Telephone Encounter (Signed)
Schedule error, pt informed and will go to ED now.

## 2015-06-19 DIAGNOSIS — J441 Chronic obstructive pulmonary disease with (acute) exacerbation: Principal | ICD-10-CM

## 2015-06-19 LAB — GLUCOSE, CAPILLARY
Glucose-Capillary: 438 mg/dL — ABNORMAL HIGH (ref 65–99)
Glucose-Capillary: 483 mg/dL — ABNORMAL HIGH (ref 65–99)
Glucose-Capillary: 308 mg/dL — ABNORMAL HIGH (ref 65–99)
Glucose-Capillary: 518 mg/dL — ABNORMAL HIGH (ref 65–99)

## 2015-06-19 LAB — HEMOGLOBIN A1C
Hgb A1c MFr Bld: 11.9 % — ABNORMAL HIGH (ref 4.8–5.6)
Mean Plasma Glucose: 295 mg/dL

## 2015-06-19 MED ORDER — INSULIN GLARGINE 100 UNIT/ML ~~LOC~~ SOLN
40.0000 [IU] | Freq: Every day | SUBCUTANEOUS | Status: DC
  Filled 2015-06-19: qty 0.4

## 2015-06-19 MED ORDER — DOXYCYCLINE HYCLATE 100 MG PO CAPS: 100.0000 mg | ORAL_CAPSULE | Freq: Two times a day (BID) | ORAL | Status: DC

## 2015-06-19 MED ORDER — PREDNISONE 20 MG PO TABS
40.0000 mg | ORAL_TABLET | Freq: Every day | ORAL | Status: DC
Start: 2015-06-20 — End: 2015-06-23

## 2015-06-19 MED ORDER — INSULIN ASPART 100 UNIT/ML ~~LOC~~ SOLN
5.0000 [IU] | Freq: Three times a day (TID) | SUBCUTANEOUS | Status: DC
  Administered 2015-06-19 (×2): 5 [IU] via SUBCUTANEOUS

## 2015-06-19 MED ORDER — INSULIN GLARGINE 100 UNIT/ML ~~LOC~~ SOLN
10.0000 [IU] | Freq: Once | SUBCUTANEOUS | Status: AC
  Administered 2015-06-19: 10 [IU] via SUBCUTANEOUS
  Filled 2015-06-19 (×2): qty 0.1

## 2015-06-19 NOTE — Progress Notes (Signed)
Subjective: Patient seen and examined this morning on rounds.  She feels much improved overnight with her breathing and shortness of breath.  No acute events overnight.    Objective: Vital signs in last 24 hours: Filed Vitals:   06/19/15 0600 06/19/15 0752 06/19/15 1102 06/19/15 1216  BP: 135/59  135/59   Pulse: 90     Temp: 98.7 F (37.1 C)     TempSrc:      Resp: 18     Height:      Weight:      SpO2: 95% 96%  97%   Weight change:   Intake/Output Summary (Last 24 hours) at 06/19/15 1333 Last data filed at 06/19/15 1300  Gross per 24 hour  Intake    600 ml  Output      3 ml  Net    597 ml   Physical Exam: General: Alert, cooperative, NAD, sitting up in bed on [place] oxygen. HEENT: EOMI. Moist mucus membranes Neck: Full range of motion without pain, supple Lungs: Normal work of breathing, speaking in full sentences.  Wheezes still appreciated on exam but improved from last night. Heart: RRR, no murmurs, gallops, or rubs Abdomen: Soft, non-tender, non-distended, BS + Extremities: 1-2 + lower extremity edema Neurologic: Alert & oriented X3, cranial nerves II-XII intact, strength grossly intact, sensation intact to light touch  Lab Results: Basic Metabolic Panel:  Recent Labs Lab 06/18/15 1042  NA 140  K 3.6  CL 99*  CO2 32  GLUCOSE 321*  BUN 8  CREATININE 0.87  CALCIUM 8.9   CBC:  Recent Labs Lab 06/18/15 1042  WBC 9.1  HGB 13.6  HCT 42.5  MCV 85.0  PLT 236   CBG:  Recent Labs Lab 06/18/15 1714 06/18/15 2120 06/18/15 2249 06/19/15 0344 06/19/15 0821 06/19/15 1209  GLUCAP 451* 585* 547* 438* 483* 308*   Hemoglobin A1C:  Recent Labs Lab 06/18/15 2006  HGBA1C 11.9*    Micro Results: No results found for this or any previous visit (from the past 240 hour(s)). Studies/Results: Dg Chest Portable 1 View  06/18/2015   CLINICAL DATA:  Shortness of breath.  EXAM: PORTABLE CHEST - 1 VIEW  COMPARISON:  April 08, 2015.  FINDINGS: Stable  cardiomegaly. Left-sided pacemaker is unchanged in position. No pneumothorax or pleural effusion is noted. No acute pulmonary disease is noted. Bony thorax is intact.  IMPRESSION: No acute cardiopulmonary abnormality seen.   Electronically Signed   By: Marijo Conception, M.D.   On: 06/18/2015 11:30   Medications:  Scheduled Meds: . amLODipine  10 mg Oral Daily  . aspirin EC  81 mg Oral Daily  . atorvastatin  80 mg Oral QHS  . doxycycline  100 mg Oral Q12H  . enoxaparin (LOVENOX) injection  40 mg Subcutaneous Q24H  . furosemide  40 mg Oral Daily  . gabapentin  300 mg Oral QHS  . insulin aspart  0-15 Units Subcutaneous TID WC  . insulin aspart  0-5 Units Subcutaneous QHS  . insulin aspart  5 Units Subcutaneous TID WC  . insulin glargine  10 Units Subcutaneous Once  . [START ON 06/20/2015] insulin glargine  40 Units Subcutaneous Q breakfast  . ipratropium-albuterol  3 mL Nebulization Q4H  . lisinopril  40 mg Oral Daily  . metoprolol succinate  50 mg Oral Daily  . predniSONE  40 mg Oral Q breakfast   Continuous Infusions:  PRN Meds:.nitroGLYCERIN Assessment/Plan: Active Problems:   Body mass index (BMI) of 45.0-49.9  in adult   HYPERTENSION, BENIGN ESSENTIAL   Chronic combined systolic and diastolic heart failure   Automatic implantable cardioverter-defibrillator in situ   Nonischemic cardiomyopathy   Diabetic gastroparesis associated with type 2 diabetes mellitus   COPD exacerbation  64 y.o. female with past medical history of COPD, combined CHF (EF 35-40% in May 2015), HTN, HLD, diabetes mellitus type II who presents to the emergency department complaining of 2 weeks of congestion and cough.   COPD Exacerbation: increased shortness of breath, dry cough with no increased sputum production or purulence can be classified as mild exacerbation with good response to bronchodilaters.  -Duonebs q4h >> encourage to use home albuterol as needed at discharge and discuss with PCP at follow up  potential benefit of ICS -prednisone 40mg  daily >> continue at discharge for 5 day course -doxycycline 100mg  q12h >> continue at discharge for 3 day course  HTN: home meds are amlodipine 10mg  daily, Lasix 40mg  daily, lisinopril 40mg  daily, Toprol XL 50mg  daily -continue home meds  HLD: home meds are atorvastatin 80mg  daily and aspirin 81mg  daily -continue home meds  Combined CHF -stable, BNP on admission of 20.8 -continue home meds  Diabetes mellitus type II: home meds are Lantus 40 units at breakfast, Humalog 5 units w/ breakfast and dinner, gabapentin 300mg  qhs -Hgb A1c 11.9.  Will recommend closer outpatient follow up and potential alteration of DM regimen.  States she has used metformin in the past and it "affected her kidneys".  Likely she can attempt to restart metformin again if not contraindicated. -CBGs were in the 400-500s, however, came down to 308 after receiving morning dose of Lantus  Lipoma of thigh: recurrence of previously resected lipoma on the lateral aspect of the right thigh. Unlikely to be cellulitis because there is no erythema or warmth to the area. Unlikely to be abscess as well because area is only mildly tender to palpation. CBC without leukocytosis. -tylenol for pain -has appointment with surgery later this month  Dispo: Discharge home today with clinic follow up next week.  The patient does have a current PCP (Alexa Sherral Hammers, MD) and does need an [facility] hospital follow-up appointment after discharge.  The patient does not have transportation limitations that hinder transportation to clinic appointments.   LOS: 1 day   Jule Ser, DO 06/19/2015, 1:33 PM

## 2015-06-19 NOTE — Progress Notes (Signed)
CBG 483 Dr paged Dr Juleen China pt given 15 and 4 units of insulin for meal coverage Dr aware

## 2015-06-19 NOTE — Progress Notes (Signed)
Patient seen and examined. Case d/w residents in detail.  In brief, 64 y/o female with PMH of COPD, chronic combined CHF (EF 35-40%), HTN, HLD, DM 2 p/w worsening SOB and cough * 2 weeks. Patient states she has a dry cough and has had DOE which has progressed over the last 2 weeks and is now dyspneic at rest. No fevers/chills, no CP, no abd pain, no n/v, no diarrhea. In ED she was noted to be hypoxic in the 62s and improved after nebs but desatted to the 80s on ambulation.   Physical Exam: Gen: AAO*3, NAD CVS: RRR, normal heart sounds Lungs: b/l scattered exp wheeze + Abd: soft, non tender, BS + Ext: 1 + b/l LE edema  Assessment and Plan: 64 y/o female p/w cough and SOB secondary to acute COPD exacerbation. She is much improved today albeit with mild wheezing still. Will d/c home on PO prednisone for 5 day course, nebs and PO doxy.   Of note her blood sugars have been uncontrolled while inpatient - possibly secondary to steroids but her A1C is 11.2. She will need f/u at [facility] for adjustment of her current regimen

## 2015-06-19 NOTE — Discharge Summary (Signed)
Name: [name] MRN: [id number] DOB: April 17, 1951 64 y.o. PCP: Vickii Chafe, MD  Date of Admission: 06/18/2015 10:19 AM Date of Discharge: 06/21/2015 Attending Physician: No att. providers found  Discharge Diagnosis: 1. COPD Exacerbation   Active Problems:   Body mass index (BMI) of 45.0-49.9 in adult   HYPERTENSION, BENIGN ESSENTIAL   Chronic combined systolic and diastolic heart failure   Automatic implantable cardioverter-defibrillator in situ   Nonischemic cardiomyopathy   Diabetic gastroparesis associated with type 2 diabetes mellitus   COPD exacerbation  Discharge Medications:   Medication List    STOP taking these medications        HYDROcodone-acetaminophen 5-325 MG per tablet  Commonly known as:  NORCO/VICODIN     ondansetron 8 MG disintegrating tablet  Commonly known as:  ZOFRAN-ODT      TAKE these medications        albuterol 108 (90 BASE) MCG/ACT inhaler  Commonly known as:  VENTOLIN HFA  Inhale 2 puffs into the lungs every 6 (six) hours as needed. Shortness of breath     amLODipine 10 MG tablet  Commonly known as:  NORVASC  TAKE 1 TABLET BY MOUTH DAILY     aspirin EC 81 MG tablet  Take 81 mg by mouth daily.     atorvastatin 80 MG tablet  Commonly known as:  LIPITOR  Take 1 tablet (80 mg total) by mouth at bedtime.     doxycycline 100 MG capsule  Commonly known as:  VIBRAMYCIN  Take 1 capsule (100 mg total) by mouth 2 (two) times daily.     furosemide 20 MG tablet  Commonly known as:  LASIX  Take 2 tablets (40 mg total) by mouth daily.     gabapentin 300 MG capsule  Commonly known as:  NEURONTIN  Take 1 capsule (300 mg total) by mouth at bedtime.     glucose blood test strip  Commonly known as:  ONE TOUCH ULTRA TEST  Use as instructed by doctor up to 3x daily,dx code 250.02 insulin requiring     insulin glargine 100 UNIT/ML injection  Commonly known as:  LANTUS  Inject 0.4 mLs (40 Units total) into the skin daily with breakfast.      insulin lispro 100 UNIT/ML KiwkPen  Commonly known as:  HUMALOG KWIKPEN  Inject 5 units with breakfast and 5 units with dinner, hold if CBGs low or not eating and call MD     lisinopril 40 MG tablet  Commonly known as:  PRINIVIL,ZESTRIL  Take 1 tablet (40 mg total) by mouth daily.     metoprolol succinate 50 MG 24 hr tablet  Commonly known as:  TOPROL-XL  Take 1 tablet (50 mg total) by mouth daily.     nitroGLYCERIN 0.4 MG SL tablet  Commonly known as:  NITROSTAT  Place 0.4 mg under the tongue every 5 (five) minutes as needed for chest pain.     omeprazole 20 MG capsule  Commonly known as:  PRILOSEC  Take one tablet daily 30 minutes before breakfast     polyethylene glycol powder powder  Commonly known as:  GLYCOLAX/MIRALAX  Take 255 g by mouth once.     predniSONE 20 MG tablet  Commonly known as:  DELTASONE  Take 2 tablets (40 mg total) by mouth daily with breakfast.        Disposition and follow-up:   [name] was discharged from [facility] in Stable condition.  At the hospital follow  up visit please address:  1.  Patients completion of a prednisone and antibiotics.  She was to complete 5 days total of prednisone 40mg  daily and 3 days total of doxycycline 100mg  bid.  Also please address whether she may benefit from an ICS for her COPD.  Also, please address her diabetes regimen and compliance to meds and blood sugar checks.  She was instructed to check her glucose 4 times per day and bring a log to follow up visit.   2.  Labs / imaging needed at time of follow-up: none  3.  Pending labs/ test needing follow-up: none  Follow-up Appointments: Follow-up Information    Follow up with Osa Craver, MD On 06/24/2015.   Specialty:  Internal Medicine   Why:  Appointment time @ 3:15pm   Contact information:   [place] [phone]       Discharge Instructions:   Consultations:    Procedures Performed:  Dg  Chest Portable 1 View  06/18/2015   CLINICAL DATA:  Shortness of breath.  EXAM: PORTABLE CHEST - 1 VIEW  COMPARISON:  April 08, 2015.  FINDINGS: Stable cardiomegaly. Left-sided pacemaker is unchanged in position. No pneumothorax or pleural effusion is noted. No acute pulmonary disease is noted. Bony thorax is intact.  IMPRESSION: No acute cardiopulmonary abnormality seen.   Electronically Signed   By: Marijo Conception, M.D.   On: 06/18/2015 11:30    2D Echo: n/a  Cardiac Cath: n/a  Admission HPI: [name] is a 64 y.o. female with past medical history of COPD, combined CHF (EF 35-40% in May 2015), HTN, HLD, diabetes mellitus type II who presents to the emergency department complaining of 2 weeks of congestion and cough. States that she has had a dry cough that is worsening and is associated with worsening shortness of breath, prompting her to come to the emergency department. States today she has been more short of breath than normal and is having symptoms at rest, which is increased for her. She has been using 2L home oxygen at night and nebulizer treatments at home to try and relieve her symptoms but has not had much success with this. She denies any fever, chills, nausea, vomiting, diarrhea, or urinary complaints. She does endorse PND and orthopnea that are unchanged from previous. She is a former smoker with about 1/2 PPD history for 35 years. She also complains of pain at the site of recurrent lipoma on the lateral aspect of her right thigh.   In the ED, patient had chest xray with no acute cardiopulmonary process. She initially had hypoxia in the low 60's that was easily corrected on non-rebreather. She did very well after a 2 hour long albuterol treatment but desaturated to the low 80's with ambulation. She was therefore admitted.  Hospital Course by problem list: Active Problems:   Body mass index (BMI) of 45.0-49.9 in adult   HYPERTENSION, BENIGN ESSENTIAL   Chronic combined  systolic and diastolic heart failure   Automatic implantable cardioverter-defibrillator in situ   Nonischemic cardiomyopathy   Diabetic gastroparesis associated with type 2 diabetes mellitus   COPD exacerbation   1. COPD exacerbation: patient was admitted with increased shortness of breath, dry cough, no increase in sputum production or purulence and so, therefore, can likely be classified as a mild exacerbation that had good response to bronchodilators.  There was less concern for pneumonia given she did not have leukocytosis, no fever or chills, and a normal  chest x-ray.  She was treated with Duonebs every 4 hours, prednisone 40 mg daily, and doxycycline 100mg  every 12 hours.  The following day she was much improved and was discharged home with instruction to [place] home albuterol as needed at discharge, complete prednisone course, and complete antibiotic course.  2. HTN: we continued her home meds.(amlodipine 10mg  daily, Lasix 40mg  daily, lisinopril 40mg  daily, Toprol XL 50mg  daily).  3. HLD: we continued her home meds (atorvastatin 80mg  daily).  4. Diabetes mellitus: her home meds are Lantus 40units at breakfast, Humalog 5 units w/ breakfast and dinner.  We continued her Lantus and placed her on SSI with HS coverage.  Her blood sugars were uncontrolled while an inpatient and her Hgb A1c was found to be 11.9.  She is not on metformin and states she has used metformin in the past and it "affected her kidneys". Likely she can attempt to restart metformin again if not contraindicated.  Discharge Vitals:   BP 113/57 mmHg  Pulse 84  Temp(Src) 98.1 F (36.7 C) (Oral)  Resp 18  Ht 5' (1.524 m)  Wt 234 lb 1.6 oz (106.187 kg)  BMI 45.72 kg/m2  SpO2 100%  Discharge Labs:  No results found for this or any previous visit (from the past 24 hour(s)).  Signed: Jule Ser, DO 06/21/2015, 10:00 PM    Services Ordered on Discharge: none Equipment Ordered on Discharge: none

## 2015-06-19 NOTE — Progress Notes (Signed)
Inpatient Diabetes Program Recommendations  AACE/ADA: New Consensus Statement on Inpatient Glycemic Control (2015)  Target Ranges:  Prepandial:   less than 140 mg/dL      Peak postprandial:   less than 180 mg/dL (1-2 hours)      Critically ill patients:  140 - 180 mg/dL   Review of Glycemic Control: Hyperglycemia > 400 mg/dl  Diabetes history: DM 2 Outpatient Diabetes medications: Lantus 40 units, Humalog 5 units BID Current orders for Inpatient glycemic control: Lantus 30 units, Novolog Moderate TID + HS scale  Inpatient Diabetes Program Recommendations:  Insulin - Basal: Due to steroid use, Please consider increasing basal insulin to at least the patients home dose, Lantus 40 units Daily.  Thanks,  Tama Headings RN, MSN, [facility] Inpatient Diabetes Coordinator Team Pager [phone] (8a-5p)

## 2015-06-19 NOTE — Progress Notes (Signed)
Utilization review completed. Bertha Stanfill, RN, BSN. 

## 2015-06-19 NOTE — Discharge Instructions (Signed)
Please take your prescriptions as instructed:   Prednisone 40mg  daily x 4 days starting tomorrow. Doxycycline 100mg  twice per day starting tonight.  Check your blood sugars 4 times per day.

## 2015-06-20 ENCOUNTER — Telehealth: Payer: Self-pay | Admitting: Pharmacist

## 2015-06-23 ENCOUNTER — Other Ambulatory Visit: Payer: Self-pay | Admitting: Surgery

## 2015-06-23 ENCOUNTER — Telehealth: Payer: Self-pay | Admitting: Pharmacist

## 2015-06-23 DIAGNOSIS — R2241 Localized swelling, mass and lump, right lower limb: Secondary | ICD-10-CM | POA: Diagnosis not present

## 2015-06-23 NOTE — Telephone Encounter (Signed)
Steroid-Induced Hyperglycemia Prevention and Management [name] is a 64 y.o. female who meets criteria for [facility] quality improvement program (diabetes patient prescribed short courses of oral steroids).  A/P Current Regimen  Patient prescribed prednisone 40 mg daily x 2 days. Patient taking prednisone in the AM  Current DM regimen insulin glargine 48 units QAM, insulin lispro 5 units with breakfast & dinner  Home BG Monitoring  Patient does check BG at home and does have a meter at home.   CBGs at home: 400 (9am) and 200 (2pm)  CBGs prior to steroid course 200s, A1C prior to steroid course 11.9  Patient does not report s/sx of hyper- or hypoglycemia  Medication Management  Additional treatment for BG control is indicated at this time.  Increased insulin glargine to 56 units daily  Patient Education  Advised patient to monitor BG while on steroid therapy (at least twice daily prior to first 2 meals of the day).  Patient educated about signs/symptoms and advised to contact clinic if hyper- or hypoglycemic.  Patient did  verbalize understanding of information and regimen by repeating back topics discussed.  Follow-up Daily per protocol  06/24/15 PCP  Kim,Jennifer J 1:59 PM 06/23/2015

## 2015-06-23 NOTE — Telephone Encounter (Signed)
06/21/15: patient reports CBG 200 around 9am. Last day of prednisone therapy, advised patient ton continue 56 units of insulin glargine daily, will call back tomorrow. Patient reports no signs/symptoms of hyper/hypoglycemia and verbalized understanding by repeat back.

## 2015-06-23 NOTE — Telephone Encounter (Signed)
06/22/15: continuing to follow patient until PCP appointment on 06/24/15. Patient reports CBG of 100 around 9am, in the 400s yesterday evening. Patient completed prednisone therapy, so reduced daily insulin glargine near original dose (50 units daily). Patient verbalized understanding by repeat back. Patient will likely need continued titration of regimen.

## 2015-06-24 ENCOUNTER — Encounter: Payer: Self-pay | Admitting: Internal Medicine

## 2015-06-24 ENCOUNTER — Other Ambulatory Visit: Payer: Self-pay | Admitting: Internal Medicine

## 2015-06-24 ENCOUNTER — Ambulatory Visit (INDEPENDENT_AMBULATORY_CARE_PROVIDER_SITE_OTHER): Payer: Medicare Other | Admitting: Internal Medicine

## 2015-06-24 VITALS — BP 124/67 | HR 96 | Temp 97.8°F | Wt 234.0 lb

## 2015-06-24 DIAGNOSIS — N182 Chronic kidney disease, stage 2 (mild): Secondary | ICD-10-CM

## 2015-06-24 DIAGNOSIS — I10 Essential (primary) hypertension: Secondary | ICD-10-CM

## 2015-06-24 DIAGNOSIS — J449 Chronic obstructive pulmonary disease, unspecified: Secondary | ICD-10-CM

## 2015-06-24 DIAGNOSIS — E1165 Type 2 diabetes mellitus with hyperglycemia: Secondary | ICD-10-CM | POA: Diagnosis not present

## 2015-06-24 DIAGNOSIS — J984 Other disorders of lung: Secondary | ICD-10-CM | POA: Insufficient documentation

## 2015-06-24 DIAGNOSIS — Z794 Long term (current) use of insulin: Secondary | ICD-10-CM

## 2015-06-24 DIAGNOSIS — I129 Hypertensive chronic kidney disease with stage 1 through stage 4 chronic kidney disease, or unspecified chronic kidney disease: Secondary | ICD-10-CM | POA: Diagnosis not present

## 2015-06-24 DIAGNOSIS — F17210 Nicotine dependence, cigarettes, uncomplicated: Secondary | ICD-10-CM

## 2015-06-24 DIAGNOSIS — E1122 Type 2 diabetes mellitus with diabetic chronic kidney disease: Secondary | ICD-10-CM

## 2015-06-24 DIAGNOSIS — S39012A Strain of muscle, fascia and tendon of lower back, initial encounter: Secondary | ICD-10-CM

## 2015-06-24 DIAGNOSIS — M545 Low back pain: Secondary | ICD-10-CM

## 2015-06-24 MED ORDER — ALBUTEROL SULFATE (2.5 MG/3ML) 0.083% IN NEBU: 2.5000 mg | INHALATION_SOLUTION | Freq: Four times a day (QID) | RESPIRATORY_TRACT | Status: DC | PRN

## 2015-06-24 MED ORDER — NAPROXEN 500 MG PO TABS: 500.0000 mg | ORAL_TABLET | Freq: Two times a day (BID) | ORAL | Status: DC

## 2015-06-24 NOTE — Progress Notes (Signed)
Internal Medicine Clinic Attending  Case discussed with Dr. Richardson at the time of the visit.  We reviewed the resident's history and exam and pertinent patient test results.  I agree with the assessment, diagnosis, and plan of care documented in the resident's note. 

## 2015-06-24 NOTE — Assessment & Plan Note (Signed)
Lab Results  Component Value Date   HGBA1C 11.9* 06/18/2015   HGBA1C 9.6 01/14/2015   HGBA1C 11.2 10/23/2014     Assessment: Diabetes control:  Uncontrolled Progress toward A1C goal:   Deteriorated Comments: Patient is a part of a clinic study involving diabetic patient on steroids. Dr. Maudie Mercury has been in frequent contact with the patient to adjust her Lantus and Humalog insulin based on CBG results in the setting of steroid use.   Plan: Medications:  continue current medications Home glucose monitoring: Frequency:   Timing:   Instruction/counseling given: discussed the need for weight loss and discussed diet Educational resources provided:   Self management tools provided:  Dr. Maudie Mercury will be in contact with patient Other plans: Will consider re-ordering metformin as this was previously held on discharge in October d/t AKI, now resolved and can be restarted.

## 2015-06-24 NOTE — Assessment & Plan Note (Signed)
Patient presents with one week history of right low back pain with radiation around hip to anterior proximal thigh. Pain is 7/10. She has tried ibuprofen with some relief. She describes the pain as sharp and hard. She denies any numbness, tingling, burning, loss of bladder or bowel continence. Physical exam is only remarkable to tenderness on palpation. DDx includes sacroilitis, lumbar muscle strain, osteoarthritis and spinal stenosis. We will try conservative management for now with heat and naproxen 500 mg BID for 2 weeks. Patient will call if pain doesn't improve. We can consider imaging at that time.

## 2015-06-24 NOTE — Assessment & Plan Note (Signed)
Patient was recently discharged from the hospital after a COPD exacerbation. Patient continues to smoke 2 cigarettes a day. COPD exac improved with steroids, duoneb treatments and doxycycline. Last PFTs are from 2013. At that time she saw pulmonology who reported "PFTs showed no airflow obstruction on spirometry, and only minimal air trapping on lung volumes. She does have restriction as expected given her orbit obesity, and has a decrease in DLCO most likely related to her known cardiomyopathy." Patient has been managed on albuterol nebs and inhaler prn. If patient does truly now have COPD, it is likely GOLD stage A. She may benefit from the addition of a short active anticholinergic such as ipratropium.   Plan: -Order PFTs -Continue albuterol prn -Consider addition of short acting anticholinergic based on PFT results -Smoking cessation

## 2015-06-24 NOTE — Progress Notes (Signed)
Subjective:    Patient ID: [name], female    DOB: [birth year], 64 y.o.   MRN: [id number]  HPI [name] is a 64 y.o. female with PMHx of HTN, combined CHF, T2DM and likely COPD who presents to the clinic for follow up from her COPD exacerbation. Please see A&P for the status of the patient's chronic medical problems.   Past Medical History  Diagnosis Date  . Hyperthyroidism, subclinical   . Post menopausal syndrome   . Obesity   . Cardiac defibrillator in situ     Atlas II VR (SJM) implanted by Dr Lovena Le  . Eczema   . Lipoma   . Chronic ulcer of leg     04-09-15 resolved-not a problem.  . Hyperlipidemia   . Chronic combined systolic and diastolic heart failure     a. EF 35-40% in past;  b. Echo 7/13:  EF 45-50%, Gr 2 diast dysfn, mild AI, mild MAC, trivial MR, mild LAE, PASP 47.  [name] NICM (nonischemic cardiomyopathy)   . Diabetes mellitus   . HTN (hypertension)   . Elevated alkaline phosphatase level     GGT and 5'nucleotidase 8/13 normal  . Hx of cardiac cath     a. [place] 2003 normal;  b. LHC 6/13:  Mild calcification in the LM, o/w normal coronary arteries, EF 45%.   . Sleep apnea     pt denies 04/12/2013  . COPD (chronic obstructive pulmonary disease)     O2 at night  . Automatic implantable cardioverter-defibrillator in situ   . CHF (congestive heart failure)   . Depression   . Implantable cardioverter-defibrillator (ICD) generator end of life   . Colon polyps 04/12/2013    Rectosigmoid polyp  . History of oxygen administration     oxygen @ 2 l/m nasally bedtime 24/7  . ATN (acute tubular necrosis) 07/15/2014    Outpatient Encounter Prescriptions as of 06/24/2015  Medication Sig Note  . albuterol (PROVENTIL) (2.5 MG/3ML) 0.083% nebulizer solution Take 3 mLs (2.5 mg total) by nebulization every 6 (six) hours as needed for wheezing or shortness of breath.   [name] albuterol (VENTOLIN HFA) 108 (90 BASE) MCG/ACT inhaler Inhale 2 puffs into the lungs every 6 (six) hours as  needed. Shortness of breath   . amLODipine (NORVASC) 10 MG tablet TAKE 1 TABLET BY MOUTH DAILY   . aspirin EC 81 MG tablet Take 81 mg by mouth daily.   [name] atorvastatin (LIPITOR) 80 MG tablet Take 1 tablet (80 mg total) by mouth at bedtime.   [name] doxycycline (VIBRAMYCIN) 100 MG capsule Take 1 capsule (100 mg total) by mouth 2 (two) times daily.   . furosemide (LASIX) 20 MG tablet Take 2 tablets (40 mg total) by mouth daily.   [name] gabapentin (NEURONTIN) 300 MG capsule Take 1 capsule (300 mg total) by mouth at bedtime.   [name] glucose blood (ONE TOUCH ULTRA TEST) test strip Use as instructed by doctor up to 3x daily,dx code 250.02 insulin requiring   . insulin glargine (LANTUS) 100 UNIT/ML injection Inject 0.4 mLs (40 Units total) into the skin daily with breakfast.   . insulin lispro (HUMALOG KWIKPEN) 100 UNIT/ML KiwkPen Inject 5 units with breakfast and 5 units with dinner, hold if CBGs low or not eating and call MD   . lisinopril (PRINIVIL,ZESTRIL) 40 MG tablet Take 1 tablet (40 mg total) by mouth daily.   . metoprolol succinate (TOPROL-XL) 50 MG 24 hr tablet Take 1 tablet (50 mg  total) by mouth daily.   . naproxen (NAPROSYN) 500 MG tablet Take 1 tablet (500 mg total) by mouth 2 (two) times daily with a meal.   . nitroGLYCERIN (NITROSTAT) 0.4 MG SL tablet Place 0.4 mg under the tongue every 5 (five) minutes as needed for chest pain.   [name] omeprazole (PRILOSEC) 20 MG capsule Take one tablet daily 30 minutes before breakfast (Patient not taking: Reported on 06/18/2015)   . polyethylene glycol powder (GLYCOLAX/MIRALAX) powder Take 255 g by mouth once. (Patient not taking: Reported on 06/18/2015) 03/25/2015: For procedure.    No facility-administered encounter medications on file as of 06/24/2015.    Family History  Problem Relation Age of Onset  . Stroke Mother   . Seizures Father   . Diabetes Sister   . Asthma Maternal Aunt     aunts  . Asthma Maternal Uncle     uncles  . Heart disease Father   . Heart  disease Paternal Aunt     aunts  . Heart disease Paternal Uncle     uncles  . Heart disease Maternal Aunt     aunts  . Heart disease Maternal Uncle     uncles  . Heart disease Maternal Grandfather   . Colon cancer Neg Hx   . Colon polyps Neg Hx   . Esophageal cancer Neg Hx   . Kidney disease Neg Hx   . Gallbladder disease Neg Hx     Social History   Social History  . Marital Status: Widowed    Spouse Name: Alroy Dust  . Number of Children: 3  . Years of Education: 11   Occupational History  . disabled    Social History Main Topics  . Smoking status: Light Tobacco Smoker -- 0.30 packs/day for 35 years    Types: Cigarettes  . Smokeless tobacco: Never Used     Comment: currently smoking 3-4 cigs daily or less./ 1 pack every 3 days  . Alcohol Use: 0.6 oz/week    1 Glasses of wine per week     Comment: Occassionally -weekends-wine  . Drug Use: No  . Sexual Activity: Not on file   Other Topics Concern  . Not on file   Social History Narrative   ** Merged History Encounter **       Married    Review of Systems General: Denies fever, chills, fatigue.  Respiratory: Denies SOB, cough, DOE, chest tightness, and wheezing.   Cardiovascular: Denies chest pain and palpitations.  Gastrointestinal: Denies nausea, vomiting, abdominal pain Musculoskeletal: Admits to right sided back pain and proximal right thigh pain. Denies numbness, tingling, joint swelling, arthralgias and gait problem.  Skin: Denies pallor, rash and wounds.  Neurological: Denies dizziness, headaches, weakness, lightheadedness, numbness, Psychiatric/Behavioral: Denies mood changes, confusion, nervousness, sleep disturbance and agitation.     Objective:   Physical Exam Filed Vitals:   06/24/15 1506  BP: 124/67  Pulse: 96  Temp: 97.8 F (36.6 C)  TempSrc: Oral  Weight: 234 lb (106.142 kg)  SpO2: 97%   General: Vital signs reviewed.  Patient is well-developed and well-nourished, in no acute distress  and cooperative with exam.  Cardiovascular: RRR, S1 normal, S2 normal, no murmurs, gallops, or rubs. Pulmonary/Chest: Clear to auscultation bilaterally, no wheezes, rales, or rhonchi. Abdominal: Soft, non-tender, non-distended, BS + Musculoskeletal: Tender to palpation over right sacroiliac joint, right gluteus maximus muscle and right proximal thigh. No erythema or rash. Normal sensation. Normal strength Extremities: Trace lower extremity edema bilaterally, pulses symmetric and  intact bilaterally.  Skin: Warm, dry and intact. No rashes or erythema. Psychiatric: Normal mood and affect. speech and behavior is normal. Cognition and memory are normal.     Assessment & Plan:   Please see problem based assessment and plan.

## 2015-06-24 NOTE — Assessment & Plan Note (Signed)
BP Readings from Last 3 Encounters:  06/24/15 124/67  06/19/15 113/57  04/16/15 181/84    Lab Results  Component Value Date   NA 140 06/18/2015   K 3.6 06/18/2015   CREATININE 0.87 06/18/2015    Assessment: Blood pressure control:  Controlled Progress toward BP goal:   At goal Comments: Compliant with amlodipine 10 mg daily, lasix 40 mg daily, lisinopril 40 mg daily, and Toprol XL 50 mg daily.  Plan: Medications:  continue current medications

## 2015-06-24 NOTE — Patient Instructions (Signed)
TAKE NAPROXEN 500 MG 1 TABLET TWICE A DAY FOR YOUR PAIN. USE A HEATING PAD AS WELL. IF PAIN DOES NOT IMPROVE, OR WORSENS, PLEASE CALL OUR OFFICE AND WE WILL DO AN XRAY.   DR. Maudie Mercury WILL CALL YOU FOR YOUR DIABETES MANAGEMENT.   CONTINUE YOUR ALBUTEROL INHALER FOR SHORTNESS OF BREATH. WE WILL ALSO ORDER REPEAT BREATHING TESTS TO EVALUATE YOUR COPD. YOU WILL BE CALLED ABOUT THIS.

## 2015-06-30 DIAGNOSIS — J449 Chronic obstructive pulmonary disease, unspecified: Secondary | ICD-10-CM | POA: Diagnosis not present

## 2015-07-01 ENCOUNTER — Encounter (HOSPITAL_COMMUNITY): Payer: Medicare Other

## 2015-07-03 ENCOUNTER — Ambulatory Visit (HOSPITAL_COMMUNITY)
Admission: RE | Admit: 2015-07-03 | Discharge: 2015-07-03 | Disposition: A | Discharge status: Home / Self Care | Payer: Medicare Other | Source: Ambulatory Visit | Attending: Internal Medicine | Admitting: Internal Medicine

## 2015-07-03 DIAGNOSIS — J449 Chronic obstructive pulmonary disease, unspecified: Secondary | ICD-10-CM | POA: Diagnosis not present

## 2015-07-03 LAB — PULMONARY FUNCTION TEST
DL/VA % pred: 125 %
DL/VA: 5.54 ml/min/mmHg/L
DLCO unc % pred: 69 %
DLCO unc: 14.11 ml/min/mmHg
FEF 25-75 Post: 1.28 L/sec
FEF 25-75 Pre: 0.91 L/sec
FEF2575-%Change-Post: 40 %
FEF2575-%Pred-Post: 70 %
FEF2575-%Pred-Pre: 50 %
FEV1-%Change-Post: 15 %
FEV1-%Pred-Post: 60 %
FEV1-%Pred-Pre: 52 %
FEV1-Post: 1.08 L
FEV1-Pre: 0.93 L
FEV1FVC-%Change-Post: -6 %
FEV1FVC-%Pred-Pre: 102 %
FEV6-%Change-Post: 23 %
FEV6-%Pred-Post: 64 %
FEV6-%Pred-Pre: 52 %
FEV6-Post: 1.42 L
FEV6-Pre: 1.15 L
FEV6FVC-%Change-Post: 0 %
FEV6FVC-%Pred-Post: 103 %
FEV6FVC-%Pred-Pre: 104 %
FVC-%Change-Post: 24 %
FVC-%Pred-Post: 62 %
FVC-%Pred-Pre: 50 %
FVC-Post: 1.43 L
FVC-Pre: 1.15 L
Post FEV1/FVC ratio: 75 %
Post FEV6/FVC ratio: 100 %
Pre FEV1/FVC ratio: 81 %
Pre FEV6/FVC Ratio: 100 %
RV % pred: 323 %
RV: 6.04 L
TLC % pred: 158 %
TLC: 7.3 L

## 2015-07-03 MED ORDER — ALBUTEROL SULFATE (2.5 MG/3ML) 0.083% IN NEBU
2.5000 mg | INHALATION_SOLUTION | Freq: Once | RESPIRATORY_TRACT | Status: AC
  Administered 2015-07-03: 2.5 mg via RESPIRATORY_TRACT

## 2015-07-03 NOTE — Telephone Encounter (Signed)
Tried contacting patient for help with medication management. Unable to reach

## 2015-07-07 ENCOUNTER — Telehealth: Payer: Self-pay | Admitting: Pharmacist

## 2015-07-09 NOTE — Telephone Encounter (Signed)
Contacted patient per Dr. Marvel Plan to help with medication management. Patient advised to bring in mediations for clinic review. Appointment scheduled for 07/15/15.

## 2015-07-11 ENCOUNTER — Emergency Department (HOSPITAL_COMMUNITY): Payer: Medicare Other

## 2015-07-11 ENCOUNTER — Inpatient Hospital Stay (HOSPITAL_COMMUNITY): Payer: Medicare Other

## 2015-07-11 ENCOUNTER — Inpatient Hospital Stay (HOSPITAL_COMMUNITY)
Admission: EM | Admit: 2015-07-11 | Discharge: 2015-07-14 | DRG: 191 | Disposition: A | Discharge status: Home / Self Care | Payer: Medicare Other | Attending: Internal Medicine | Admitting: Internal Medicine

## 2015-07-11 ENCOUNTER — Encounter (HOSPITAL_COMMUNITY): Payer: Self-pay | Admitting: Emergency Medicine

## 2015-07-11 DIAGNOSIS — Z6841 Body Mass Index (BMI) 40.0 and over, adult: Secondary | ICD-10-CM | POA: Diagnosis not present

## 2015-07-11 DIAGNOSIS — N182 Chronic kidney disease, stage 2 (mild): Secondary | ICD-10-CM | POA: Diagnosis present

## 2015-07-11 DIAGNOSIS — E1165 Type 2 diabetes mellitus with hyperglycemia: Secondary | ICD-10-CM | POA: Diagnosis not present

## 2015-07-11 DIAGNOSIS — M5416 Radiculopathy, lumbar region: Secondary | ICD-10-CM | POA: Diagnosis not present

## 2015-07-11 DIAGNOSIS — E1129 Type 2 diabetes mellitus with other diabetic kidney complication: Secondary | ICD-10-CM | POA: Diagnosis present

## 2015-07-11 DIAGNOSIS — I10 Essential (primary) hypertension: Secondary | ICD-10-CM | POA: Diagnosis present

## 2015-07-11 DIAGNOSIS — E119 Type 2 diabetes mellitus without complications: Secondary | ICD-10-CM | POA: Diagnosis present

## 2015-07-11 DIAGNOSIS — R079 Chest pain, unspecified: Secondary | ICD-10-CM | POA: Insufficient documentation

## 2015-07-11 DIAGNOSIS — I5042 Chronic combined systolic (congestive) and diastolic (congestive) heart failure: Secondary | ICD-10-CM | POA: Diagnosis present

## 2015-07-11 DIAGNOSIS — Z794 Long term (current) use of insulin: Secondary | ICD-10-CM

## 2015-07-11 DIAGNOSIS — Z9981 Dependence on supplemental oxygen: Secondary | ICD-10-CM | POA: Diagnosis not present

## 2015-07-11 DIAGNOSIS — F17210 Nicotine dependence, cigarettes, uncomplicated: Secondary | ICD-10-CM | POA: Diagnosis not present

## 2015-07-11 DIAGNOSIS — Z79899 Other long term (current) drug therapy: Secondary | ICD-10-CM | POA: Diagnosis not present

## 2015-07-11 DIAGNOSIS — I429 Cardiomyopathy, unspecified: Secondary | ICD-10-CM | POA: Diagnosis not present

## 2015-07-11 DIAGNOSIS — J441 Chronic obstructive pulmonary disease with (acute) exacerbation: Secondary | ICD-10-CM | POA: Diagnosis present

## 2015-07-11 DIAGNOSIS — Z95810 Presence of automatic (implantable) cardiac defibrillator: Secondary | ICD-10-CM | POA: Diagnosis not present

## 2015-07-11 DIAGNOSIS — R0602 Shortness of breath: Secondary | ICD-10-CM | POA: Diagnosis not present

## 2015-07-11 DIAGNOSIS — F329 Major depressive disorder, single episode, unspecified: Secondary | ICD-10-CM | POA: Diagnosis not present

## 2015-07-11 DIAGNOSIS — E0590 Thyrotoxicosis, unspecified without thyrotoxic crisis or storm: Secondary | ICD-10-CM | POA: Diagnosis not present

## 2015-07-11 DIAGNOSIS — M5116 Intervertebral disc disorders with radiculopathy, lumbar region: Secondary | ICD-10-CM | POA: Diagnosis present

## 2015-07-11 DIAGNOSIS — F17211 Nicotine dependence, cigarettes, in remission: Secondary | ICD-10-CM | POA: Diagnosis not present

## 2015-07-11 DIAGNOSIS — E1142 Type 2 diabetes mellitus with diabetic polyneuropathy: Secondary | ICD-10-CM

## 2015-07-11 DIAGNOSIS — E1122 Type 2 diabetes mellitus with diabetic chronic kidney disease: Secondary | ICD-10-CM | POA: Diagnosis not present

## 2015-07-11 DIAGNOSIS — I129 Hypertensive chronic kidney disease with stage 1 through stage 4 chronic kidney disease, or unspecified chronic kidney disease: Secondary | ICD-10-CM | POA: Diagnosis not present

## 2015-07-11 DIAGNOSIS — M545 Low back pain: Secondary | ICD-10-CM | POA: Diagnosis not present

## 2015-07-11 DIAGNOSIS — I428 Other cardiomyopathies: Secondary | ICD-10-CM

## 2015-07-11 DIAGNOSIS — I130 Hypertensive heart and chronic kidney disease with heart failure and stage 1 through stage 4 chronic kidney disease, or unspecified chronic kidney disease: Secondary | ICD-10-CM | POA: Diagnosis present

## 2015-07-11 LAB — CBC WITH DIFFERENTIAL/PLATELET
Basophils Absolute: 0.1 10*3/uL (ref 0.0–0.1)
Basophils Relative: 1 %
Eosinophils Absolute: 1.3 10*3/uL — ABNORMAL HIGH (ref 0.0–0.7)
Eosinophils Relative: 13 %
HCT: 42.2 % (ref 36.0–46.0)
Hemoglobin: 13.2 g/dL (ref 12.0–15.0)
Lymphocytes Relative: 10 %
Lymphs Abs: 1 10*3/uL (ref 0.7–4.0)
MCH: 26.6 pg (ref 26.0–34.0)
MCHC: 31.3 g/dL (ref 30.0–36.0)
MCV: 84.9 fL (ref 78.0–100.0)
Monocytes Absolute: 0.6 10*3/uL (ref 0.1–1.0)
Monocytes Relative: 6 %
Neutro Abs: 7.4 10*3/uL (ref 1.7–7.7)
Neutrophils Relative %: 70 %
Platelets: 222 10*3/uL (ref 150–400)
RBC: 4.97 MIL/uL (ref 3.87–5.11)
RDW: 15.2 % (ref 11.5–15.5)
WBC: 10.4 10*3/uL (ref 4.0–10.5)

## 2015-07-11 LAB — BASIC METABOLIC PANEL
Anion gap: 10 (ref 5–15)
BUN: 5 mg/dL — ABNORMAL LOW (ref 6–20)
CO2: 25 mmol/L (ref 22–32)
Calcium: 8.7 mg/dL — ABNORMAL LOW (ref 8.9–10.3)
Chloride: 100 mmol/L — ABNORMAL LOW (ref 101–111)
Creatinine, Ser: 0.88 mg/dL (ref 0.44–1.00)
GFR calc Af Amer: >60 mL/min (ref >60)
GFR calc non Af Amer: >60 mL/min (ref >60)
Glucose, Bld: 537 mg/dL — ABNORMAL HIGH (ref 65–99)
Potassium: 3.8 mmol/L (ref 3.5–5.1)
Sodium: 135 mmol/L (ref 135–145)

## 2015-07-11 LAB — GLUCOSE, CAPILLARY: Glucose-Capillary: 249 mg/dL — ABNORMAL HIGH (ref 65–99)

## 2015-07-11 LAB — I-STAT TROPONIN, ED: Troponin i, poc: 0.03 ng/mL (ref 0.00–0.08)

## 2015-07-11 LAB — CBG MONITORING, ED: Glucose-Capillary: 387 mg/dL — ABNORMAL HIGH (ref 65–99)

## 2015-07-11 LAB — TROPONIN I: Troponin I: 0.04 ng/mL — ABNORMAL HIGH (ref <0.031)

## 2015-07-11 LAB — BRAIN NATRIURETIC PEPTIDE: B Natriuretic Peptide: 60.7 pg/mL (ref 0.0–100.0)

## 2015-07-11 MED ORDER — ALBUTEROL SULFATE (2.5 MG/3ML) 0.083% IN NEBU
INHALATION_SOLUTION | RESPIRATORY_TRACT | Status: AC
  Filled 2015-07-11: qty 3

## 2015-07-11 MED ORDER — ATORVASTATIN CALCIUM 80 MG PO TABS
80.0000 mg | ORAL_TABLET | Freq: Every day | ORAL | Status: DC
  Administered 2015-07-11 – 2015-07-13 (×3): 80 mg via ORAL
  Filled 2015-07-11 (×3): qty 1

## 2015-07-11 MED ORDER — IPRATROPIUM-ALBUTEROL 0.5-2.5 (3) MG/3ML IN SOLN
3.0000 mL | Freq: Four times a day (QID) | RESPIRATORY_TRACT | Status: DC
  Administered 2015-07-11 – 2015-07-12 (×3): 3 mL via RESPIRATORY_TRACT
  Filled 2015-07-11 (×2): qty 3

## 2015-07-11 MED ORDER — NITROGLYCERIN 0.4 MG SL SUBL: 0.4000 mg | SUBLINGUAL_TABLET | SUBLINGUAL | Status: DC | PRN

## 2015-07-11 MED ORDER — ONDANSETRON HCL 4 MG/2ML IJ SOLN: 4.0000 mg | Freq: Three times a day (TID) | INTRAMUSCULAR | Status: DC | PRN

## 2015-07-11 MED ORDER — FUROSEMIDE 10 MG/ML IJ SOLN
40.0000 mg | Freq: Once | INTRAMUSCULAR | Status: AC
  Administered 2015-07-11: 40 mg via INTRAVENOUS
  Filled 2015-07-11: qty 4

## 2015-07-11 MED ORDER — INSULIN ASPART 100 UNIT/ML ~~LOC~~ SOLN
0.0000 [IU] | Freq: Every day | SUBCUTANEOUS | Status: DC
  Administered 2015-07-11: 3 [IU] via SUBCUTANEOUS
  Administered 2015-07-12 – 2015-07-13 (×2): 5 [IU] via SUBCUTANEOUS

## 2015-07-11 MED ORDER — SODIUM CHLORIDE 0.9 % IV SOLN: 250.0000 mL | INTRAVENOUS | Status: DC | PRN

## 2015-07-11 MED ORDER — LISINOPRIL 40 MG PO TABS
40.0000 mg | ORAL_TABLET | Freq: Every day | ORAL | Status: DC
  Administered 2015-07-12 – 2015-07-14 (×3): 40 mg via ORAL
  Filled 2015-07-11 (×3): qty 1

## 2015-07-11 MED ORDER — FUROSEMIDE 10 MG/ML IJ SOLN
40.0000 mg | Freq: Every day | INTRAMUSCULAR | Status: DC
  Administered 2015-07-12: 40 mg via INTRAVENOUS
  Filled 2015-07-11: qty 4

## 2015-07-11 MED ORDER — SODIUM CHLORIDE 0.9 % IJ SOLN: 3.0000 mL | INTRAMUSCULAR | Status: DC | PRN

## 2015-07-11 MED ORDER — ACETAMINOPHEN 325 MG PO TABS
650.0000 mg | ORAL_TABLET | Freq: Four times a day (QID) | ORAL | Status: DC | PRN
Start: 2015-07-11 — End: 2015-07-14

## 2015-07-11 MED ORDER — ENOXAPARIN SODIUM 40 MG/0.4ML ~~LOC~~ SOLN
40.0000 mg | SUBCUTANEOUS | Status: DC
  Administered 2015-07-11 – 2015-07-13 (×3): 40 mg via SUBCUTANEOUS
  Filled 2015-07-11 (×2): qty 0.4

## 2015-07-11 MED ORDER — LEVOFLOXACIN 750 MG PO TABS
750.0000 mg | ORAL_TABLET | Freq: Every day | ORAL | Status: DC
  Administered 2015-07-12 (×2): 750 mg via ORAL
  Filled 2015-07-11: qty 1

## 2015-07-11 MED ORDER — METOPROLOL SUCCINATE ER 50 MG PO TB24
50.0000 mg | ORAL_TABLET | Freq: Every day | ORAL | Status: DC
  Administered 2015-07-12 – 2015-07-13 (×2): 50 mg via ORAL
  Filled 2015-07-11 (×3): qty 1

## 2015-07-11 MED ORDER — PREDNISONE 20 MG PO TABS
40.0000 mg | ORAL_TABLET | Freq: Every day | ORAL | Status: DC
  Administered 2015-07-12 – 2015-07-14 (×4): 40 mg via ORAL
  Filled 2015-07-11 (×3): qty 2

## 2015-07-11 MED ORDER — SODIUM CHLORIDE 0.9 % IJ SOLN
3.0000 mL | Freq: Two times a day (BID) | INTRAMUSCULAR | Status: DC
  Administered 2015-07-11 – 2015-07-14 (×6): 3 mL via INTRAVENOUS

## 2015-07-11 MED ORDER — GABAPENTIN 300 MG PO CAPS
300.0000 mg | ORAL_CAPSULE | Freq: Every day | ORAL | Status: DC
  Administered 2015-07-11: 300 mg via ORAL
  Filled 2015-07-11: qty 1

## 2015-07-11 MED ORDER — INSULIN ASPART 100 UNIT/ML ~~LOC~~ SOLN
10.0000 [IU] | Freq: Once | SUBCUTANEOUS | Status: AC
  Administered 2015-07-11: 10 [IU] via SUBCUTANEOUS
  Filled 2015-07-11: qty 1

## 2015-07-11 MED ORDER — INSULIN ASPART 100 UNIT/ML ~~LOC~~ SOLN
0.0000 [IU] | Freq: Three times a day (TID) | SUBCUTANEOUS | Status: DC
  Administered 2015-07-12: 11 [IU] via SUBCUTANEOUS
  Administered 2015-07-12: 15 [IU] via SUBCUTANEOUS
  Administered 2015-07-12: 20 [IU] via SUBCUTANEOUS
  Administered 2015-07-13 (×2): 7 [IU] via SUBCUTANEOUS
  Administered 2015-07-14: 11 [IU] via SUBCUTANEOUS
  Administered 2015-07-14: 4 [IU] via SUBCUTANEOUS

## 2015-07-11 MED ORDER — ALBUTEROL SULFATE (2.5 MG/3ML) 0.083% IN NEBU
2.5000 mg | INHALATION_SOLUTION | Freq: Once | RESPIRATORY_TRACT | Status: AC
  Administered 2015-07-11: 2.5 mg via RESPIRATORY_TRACT

## 2015-07-11 MED ORDER — ACETAMINOPHEN 650 MG RE SUPP: 650.0000 mg | Freq: Four times a day (QID) | RECTAL | Status: DC | PRN

## 2015-07-11 MED ORDER — ASPIRIN EC 81 MG PO TBEC
81.0000 mg | DELAYED_RELEASE_TABLET | Freq: Every day | ORAL | Status: DC
  Administered 2015-07-12 – 2015-07-14 (×4): 81 mg via ORAL
  Filled 2015-07-11 (×4): qty 1

## 2015-07-11 MED ORDER — INSULIN GLARGINE 100 UNIT/ML ~~LOC~~ SOLN
27.0000 [IU] | Freq: Two times a day (BID) | SUBCUTANEOUS | Status: DC
  Administered 2015-07-11 – 2015-07-13 (×4): 27 [IU] via SUBCUTANEOUS
  Filled 2015-07-11 (×7): qty 0.27

## 2015-07-11 MED ORDER — AMLODIPINE BESYLATE 10 MG PO TABS
10.0000 mg | ORAL_TABLET | Freq: Every day | ORAL | Status: DC
  Administered 2015-07-12 – 2015-07-14 (×3): 10 mg via ORAL
  Filled 2015-07-11 (×3): qty 1

## 2015-07-11 NOTE — ED Notes (Signed)
No pain   Her breathuing is much better not distressed.  Iv med given

## 2015-07-11 NOTE — ED Provider Notes (Signed)
CSN: [id number]     Arrival date & time 07/11/15  1324 History   First MD Initiated Contact with Patient 07/11/15 1354     Chief Complaint  Patient presents with  . Asthma      HPI Pt. Stated, I've been having a shortness of breath for about a week now.  Has been using her inhalers and her nebulizer at home with no relief.  Denies fever.  Patient is a diabetic.  Denies productive cough.  Denies chest pain. I go to the clinic downstairs. They can see me Wednesday. Past Medical History  Diagnosis Date  . Hyperthyroidism, subclinical   . Post menopausal syndrome   . Obesity   . Cardiac defibrillator in situ     Atlas II VR (SJM) implanted by Dr Lovena Le  . Eczema   . Lipoma   . Chronic ulcer of leg ([place])     04-09-15 resolved-not a problem.  . Hyperlipidemia   . Chronic combined systolic and diastolic heart failure (HCC)     a. EF 35-40% in past;  b. Echo 7/13:  EF 45-50%, Gr 2 diast dysfn, mild AI, mild MAC, trivial MR, mild LAE, PASP 47.  [name] NICM (nonischemic cardiomyopathy) ([place])   . Diabetes mellitus   . HTN (hypertension)   . Elevated alkaline phosphatase level     GGT and 5'nucleotidase 8/13 normal  . Hx of cardiac cath     a. [place] 2003 normal;  b. LHC 6/13:  Mild calcification in the LM, o/w normal coronary arteries, EF 45%.   . Sleep apnea     pt denies 04/12/2013  . COPD (chronic obstructive pulmonary disease) (HCC)     O2 at night  . Automatic implantable cardioverter-defibrillator in situ   . CHF (congestive heart failure) ([place])   . Depression   . Implantable cardioverter-defibrillator (ICD) generator end of life   . Colon polyps 04/12/2013    Rectosigmoid polyp  . History of oxygen administration     oxygen @ 2 l/m nasally bedtime 24/7  . ATN (acute tubular necrosis) ([place]) 07/15/2014   Past Surgical History  Procedure Laterality Date  . Cardiac defibrillator placement  05/04/2007    SJM Atlas II VR ICD  . Hysteroscopy    . Cardiac defibrillator placement    .  Abdominal hysterectomy    . Cardiac catheterization    . Insert / replace / remove pacemaker    . Tubal ligation    . Hernia repair    . Colonoscopy N/A 04/12/2013    Procedure: COLONOSCOPY;  Surgeon: Beryle Beams, MD;  Location: [facility] ENDOSCOPY;  Service: Endoscopy;  Laterality: N/A;  pt.has defibrilator  . Left heart catheterization with coronary angiogram N/A 04/02/2012    Procedure: LEFT HEART CATHETERIZATION WITH CORONARY ANGIOGRAM;  Surgeon: Hillary Bow, MD;  Location: [facility] CATH LAB;  Service: Cardiovascular;  Laterality: N/A;  . Implantable cardioverter defibrillator (icd) generator change N/A 04/02/2014    Procedure: ICD GENERATOR CHANGE;  Surgeon: Evans Lance, MD;  Location: [facility] CATH LAB;  Service: Cardiovascular;  Laterality: N/A;  . Esophagogastroduodenoscopy N/A 04/16/2015    Procedure: ESOPHAGOGASTRODUODENOSCOPY (EGD);  Surgeon: [name], MD;  Location: [facility];  Service: Endoscopy;  Laterality: N/A;  . Colonoscopy N/A 04/16/2015    Procedure: COLONOSCOPY;  Surgeon: [name], MD;  Location: [facility] ENDOSCOPY;  Service: Endoscopy;  Laterality: N/A;   Family History  Problem Relation Age of Onset  . Stroke Mother   . Seizures  Father   . Diabetes Sister   . Asthma Maternal Aunt     aunts  . Asthma Maternal Uncle     uncles  . Heart disease Father   . Heart disease Paternal Aunt     aunts  . Heart disease Paternal Uncle     uncles  . Heart disease Maternal Aunt     aunts  . Heart disease Maternal Uncle     uncles  . Heart disease Maternal Grandfather   . Colon cancer Neg Hx   . Colon polyps Neg Hx   . Esophageal cancer Neg Hx   . Kidney disease Neg Hx   . Gallbladder disease Neg Hx    Social History  Substance Use Topics  . Smoking status: Light Tobacco Smoker -- 0.30 packs/day for 35 years    Types: Cigarettes  . Smokeless tobacco: Never Used     Comment: currently smoking 3-4 cigs daily or less./ 1 pack every 3 days  . Alcohol Use: 0.6 oz/week     1 Glasses of wine per week     Comment: Occassionally -weekends-wine   OB History    No data available     Review of Systems  All other systems reviewed and are negative.     Allergies  Actos; Avandia; and Hydrocortisone  Home Medications   Prior to Admission medications   Medication Sig Start Date End Date Taking? Authorizing Provider  albuterol (PROVENTIL) (2.5 MG/3ML) 0.083% nebulizer solution USE 1 VIAL VIA NEBULIZER EVERY 6 HOURS AS NEEDED FOR WHEEZING OR SHORTNESS OF BREATH 06/26/15  Yes Alexa Sherral Hammers, MD  albuterol (VENTOLIN HFA) 108 (90 BASE) MCG/ACT inhaler Inhale 2 puffs into the lungs every 6 (six) hours as needed. Shortness of breath 07/09/13  Yes Wilber Oliphant, MD  amLODipine (NORVASC) 10 MG tablet TAKE 1 TABLET BY MOUTH DAILY 05/15/15  Yes Alexa Sherral Hammers, MD  aspirin EC 81 MG tablet Take 81 mg by mouth daily.   Yes Historical Provider, MD  atorvastatin (LIPITOR) 80 MG tablet Take 1 tablet (80 mg total) by mouth at bedtime. 01/14/15  Yes Lucious Groves, DO  furosemide (LASIX) 20 MG tablet Take 2 tablets (40 mg total) by mouth daily. 03/26/15  Yes Sid Falcon, MD  gabapentin (NEURONTIN) 300 MG capsule Take 1 capsule (300 mg total) by mouth at bedtime. 07/04/14  Yes Nischal Narendra, MD  insulin glargine (LANTUS) 100 UNIT/ML injection Inject 27 Units into the skin daily.   Yes Historical Provider, MD  lisinopril (PRINIVIL,ZESTRIL) 40 MG tablet Take 1 tablet (40 mg total) by mouth daily. 06/02/15  Yes Shela Leff, MD  metoprolol succinate (TOPROL-XL) 50 MG 24 hr tablet Take 1 tablet (50 mg total) by mouth daily. 05/15/15  Yes Alexa Sherral Hammers, MD  naproxen (NAPROSYN) 500 MG tablet Take 1 tablet (500 mg total) by mouth 2 (two) times daily with a meal. 06/24/15  Yes Alexa Sherral Hammers, MD  doxycycline (VIBRAMYCIN) 100 MG capsule Take 1 capsule (100 mg total) by mouth 2 (two) times daily. Patient not taking: Reported on 07/11/2015 06/19/15   Jule Ser, DO   glucose blood (ONE TOUCH ULTRA TEST) test strip Use as instructed by doctor up to 3x daily,dx code 250.02 insulin requiring 05/20/13   Wilber Oliphant, MD  insulin glargine (LANTUS) 100 UNIT/ML injection Inject 0.4 mLs (40 Units total) into the skin daily with breakfast. Patient not taking: Reported on 07/11/2015 02/09/15   Wilber Oliphant, MD  insulin  lispro (HUMALOG KWIKPEN) 100 UNIT/ML KiwkPen Inject 5 units with breakfast and 5 units with dinner, hold if CBGs low or not eating and call MD 02/09/15   Wilber Oliphant, MD  nitroGLYCERIN (NITROSTAT) 0.4 MG SL tablet Place 0.4 mg under the tongue every 5 (five) minutes as needed for chest pain.    Historical Provider, MD  omeprazole (PRILOSEC) 20 MG capsule Take one tablet daily 30 minutes before breakfast Patient not taking: Reported on 06/18/2015 03/03/15   Amy S Esterwood, PA-C  polyethylene glycol powder (GLYCOLAX/MIRALAX) powder Take 255 g by mouth once. Patient not taking: Reported on 06/18/2015 03/20/15   Amy S Esterwood, PA-C   BP 132/95 mmHg  Pulse 111  Temp(Src) 98.1 F (36.7 C) (Oral)  Resp 32  Ht 5' 0.25" (1.53 m)  Wt 236 lb (107.049 kg)  BMI 45.73 kg/m2  SpO2 95% Physical Exam Physical Exam  Nursing note and vitals reviewed. Constitutional: She is oriented to person, place, and time. She appears well-developed and well-nourished.  Mild-to-moderate respiratory distress.  HENT:  Head: Normocephalic and atraumatic.  Eyes: Pupils are equal, round, and reactive to light.  Neck: Normal range of motion.  Cardiovascular: Normal rate and intact distal pulses.   Pulmonary/Chest: Scattered expiratory wheezes in all lung fields. Abdominal: Normal appearance. She exhibits no distension.  Musculoskeletal: Normal range of motion.  Neurological: She is alert and oriented to person, place, and time. No cranial nerve deficit.  Skin: Skin is warm and dry. No rash noted.  Psychiatric: She has a normal mood and affect. Her behavior is normal.    ED Course  Procedures (including critical care time) Medications  albuterol (PROVENTIL) (2.5 MG/3ML) 0.083% nebulizer solution (not administered)  furosemide (LASIX) injection 40 mg (not administered)  albuterol (PROVENTIL) (2.5 MG/3ML) 0.083% nebulizer solution 2.5 mg (2.5 mg Nebulization Given 07/11/15 1339)    Labs Review Labs Reviewed  CBC WITH DIFFERENTIAL/PLATELET - Abnormal; Notable for the following:    Eosinophils Absolute 1.3 (*)    All other components within normal limits  BASIC METABOLIC PANEL  BRAIN NATRIURETIC PEPTIDE  I-STAT TROPOININ, ED    Imaging Review Dg Chest Portable 1 View  07/11/2015   CLINICAL DATA:  SOB x1 week. Pt was recently released from the hospital less than 2 weeks ago. Hx of COPD, CHF, DM, HTN,combined systolic and diastolic heart failure. Smoker.  EXAM: PORTABLE CHEST - 1 VIEW  COMPARISON:  06/18/2015  FINDINGS: Left subclavian AICD stable. Mild cardiomegaly stable. Patchy interstitial and airspace opacities in the lung bases and perihilar regions with increase since previous exam. Central peribronchial thickening. No pneumothorax. No effusion. Visualized skeletal structures are unremarkable.  IMPRESSION: 1. Mild perihilar and bibasilar vascular congestion or infiltrates. 2. Stable cardiomegaly   Electronically Signed   By: Lucrezia Europe M.D.   On: 07/11/2015 14:52   I have personally reviewed and evaluated these images and lab results as part of my medical decision-making.   EKG Interpretation   Date/Time:  Saturday July 11 2015 13:46:34 EDT Ventricular Rate:  103 PR Interval:  156 QRS Duration: 92 QT Interval:  408 QTC Calculation: 534 R Axis:   71 Text Interpretation:  Sinus tachycardia Biatrial enlargement Nonspecific T  wave abnormality Abnormal ECG Confirmed by Audie Pinto  MD, Mikyle Sox ([id number]) on  07/11/2015 2:01:39 PM      MDM   Final diagnoses:  COPD exacerbation (HCC)        Leonard Schwartz, MD 07/11/15 1530

## 2015-07-11 NOTE — ED Notes (Signed)
Pt CBG, 387. Nurse was notified.

## 2015-07-11 NOTE — ED Notes (Signed)
Pt. Stated, I've been having a asthma for about a week now.  I go to the clinic downstairs. They can see me Wednesday.

## 2015-07-11 NOTE — Progress Notes (Signed)
Pt arrived to the unit. Tele monitor applied and tele unit called. Pt hooked up to O2 @ 2L. MD notified that patient had arrived to the unit. Shift change within 30 of patient arriving to the unit.  Rosalio Loud, RN

## 2015-07-11 NOTE — ED Notes (Signed)
Pt difficult stick  Just had an iv placed.

## 2015-07-11 NOTE — ED Notes (Signed)
Report given to chg rn on 5 w

## 2015-07-11 NOTE — H&P (Signed)
Date: 07/11/2015               Patient Name:  [name] MRN: [id number]  DOB: 1951/03/20 Age / Sex: 64 y.o., female   PCP: Vickii Chafe, MD         Medical Service: Internal Medicine Teaching Service         Attending Physician: Dr. Michel Bickers, MD    First Contact: Dr. Charlynn Grimes Pager: [phone]  Second Contact: Dr. Marvel Plan Pager: [phone]       After Hours (After 5p/  First Contact Pager: [phone]  weekends / holidays): Second Contact Pager: [phone]   Chief Complaint: SOB  History of Present Illness: [name] is a 64 year old female with a history of COPD, combined systolic and diastolic heart failure, obesity, HTN, insulin dependant DM type 2 presents to [facility] ED with 1 week history of worsening shortness of breath. She reports that over the past weeks she has had worsening shortness of breath anda  productive cough with clear sputum. She reports a subjective fever and chills 2 nights ago which resolved. She has been using her albuterol nebulizer every 6 hours at home with no relief of her symptoms. She is on 2L of oxygen at night normally but has been using it during the day the past week due to shortness of breath. She admits to chronic LE edema that is unchanged from previous. She has been taking her Lasix 40 mg daily and urinating normally. She reports that she is normally barely able to walk to the bathroom and back due to dyspnea and is worsening with dyspnea with any exertion. She cannot lay flat at night using 3 pillows at night. Wakes up gasping at night. Reports previous sleep study was normal. She also report substernal chest pain without radiation. Describes 8/10 constant chest pressure for the past week, no worse with inspiration. Reports it is similar to pain she had years ago when she was first diagnosed with CHF. No recent weight changes.  Meds: Current Facility-Administered Medications  Medication Dose Route Frequency Provider Last Rate Last Dose  . 0.9 %  sodium  chloride infusion  250 mL Intravenous PRN Alexa Sherral Hammers, MD      . acetaminophen (TYLENOL) tablet 650 mg  650 mg Oral Q6H PRN Alexa Sherral Hammers, MD       Or  . acetaminophen (TYLENOL) suppository 650 mg  650 mg Rectal Q6H PRN Alexa Sherral Hammers, MD      . albuterol (PROVENTIL) (2.5 MG/3ML) 0.083% nebulizer solution           . amLODipine (NORVASC) tablet 10 mg  10 mg Oral Daily Alexa Sherral Hammers, MD      . aspirin EC tablet 81 mg  81 mg Oral Daily Alexa Sherral Hammers, MD      . atorvastatin (LIPITOR) tablet 80 mg  80 mg Oral QHS Alexa Sherral Hammers, MD      . enoxaparin (LOVENOX) injection 40 mg  40 mg Subcutaneous Q24H Alexa Sherral Hammers, MD      . Derrill Memo ON 07/12/2015] furosemide (LASIX) injection 40 mg  40 mg Intravenous Daily Alexa Sherral Hammers, MD      . gabapentin (NEURONTIN) capsule 300 mg  300 mg Oral QHS Alexa Sherral Hammers, MD      . Derrill Memo ON 07/12/2015] insulin aspart (novoLOG) injection 0-20 Units  0-20 Units Subcutaneous TID WC Alexa Sherral Hammers, MD      . insulin aspart (novoLOG) injection  0-5 Units  0-5 Units Subcutaneous QHS Alexa Sherral Hammers, MD      . insulin glargine (LANTUS) injection 27 Units  27 Units Subcutaneous BID Alexa Sherral Hammers, MD      . ipratropium-albuterol (DUONEB) 0.5-2.5 (3) MG/3ML nebulizer solution 3 mL  3 mL Nebulization Q6H Alexa Sherral Hammers, MD      . levofloxacin (LEVAQUIN) tablet 750 mg  750 mg Oral Daily Alexa Sherral Hammers, MD      . lisinopril (PRINIVIL,ZESTRIL) tablet 40 mg  40 mg Oral Daily Alexa Sherral Hammers, MD      . metoprolol succinate (TOPROL-XL) 24 hr tablet 50 mg  50 mg Oral Daily Alexa Sherral Hammers, MD      . nitroGLYCERIN (NITROSTAT) SL tablet 0.4 mg  0.4 mg Sublingual Q5 min PRN Alexa Sherral Hammers, MD      . predniSONE (DELTASONE) tablet 40 mg  40 mg Oral Q breakfast Alexa Sherral Hammers, MD      . sodium chloride 0.9 % injection 3 mL  3 mL Intravenous Q12H Alexa Sherral Hammers, MD      . sodium chloride 0.9 % injection 3 mL  3 mL  Intravenous PRN Alexa Sherral Hammers, MD        Allergies: Allergies as of 07/11/2015 - Review Complete 07/11/2015  Allergen Reaction Noted  . Actos [pioglitazone]  03/12/2009  . Avandia [rosiglitazone maleate]  04/13/2011  . Hydrocortisone  10/06/2009   Past Medical History  Diagnosis Date  . Hyperthyroidism, subclinical   . Post menopausal syndrome   . Obesity   . Cardiac defibrillator in situ     Atlas II VR (SJM) implanted by Dr Lovena Le  . Eczema   . Lipoma   . Chronic ulcer of leg ([place])     04-09-15 resolved-not a problem.  . Hyperlipidemia   . Chronic combined systolic and diastolic heart failure (HCC)     a. EF 35-40% in past;  b. Echo 7/13:  EF 45-50%, Gr 2 diast dysfn, mild AI, mild MAC, trivial MR, mild LAE, PASP 47.  [name] NICM (nonischemic cardiomyopathy) ([place])   . Diabetes mellitus   . HTN (hypertension)   . Elevated alkaline phosphatase level     GGT and 5'nucleotidase 8/13 normal  . Hx of cardiac cath     a. [place] 2003 normal;  b. LHC 6/13:  Mild calcification in the LM, o/w normal coronary arteries, EF 45%.   . Sleep apnea     pt denies 04/12/2013  . COPD (chronic obstructive pulmonary disease) (HCC)     O2 at night  . Automatic implantable cardioverter-defibrillator in situ   . CHF (congestive heart failure) ([place])   . Depression   . Implantable cardioverter-defibrillator (ICD) generator end of life   . Colon polyps 04/12/2013    Rectosigmoid polyp  . History of oxygen administration     oxygen @ 2 l/m nasally bedtime 24/7  . ATN (acute tubular necrosis) ([place]) 07/15/2014   Past Surgical History  Procedure Laterality Date  . Cardiac defibrillator placement  05/04/2007    SJM Atlas II VR ICD  . Hysteroscopy    . Cardiac defibrillator placement    . Abdominal hysterectomy    . Cardiac catheterization    . Insert / replace / remove pacemaker    . Tubal ligation    . Hernia repair    . Colonoscopy N/A 04/12/2013    Procedure: COLONOSCOPY;  Surgeon: Beryle Beams, MD;  Location: [facility] ENDOSCOPY;  Service: Endoscopy;  Laterality: N/A;  pt.has defibrilator  . Left heart catheterization with coronary angiogram N/A 04/02/2012    Procedure: LEFT HEART CATHETERIZATION WITH CORONARY ANGIOGRAM;  Surgeon: Hillary Bow, MD;  Location: [facility] CATH LAB;  Service: Cardiovascular;  Laterality: N/A;  . Implantable cardioverter defibrillator (icd) generator change N/A 04/02/2014    Procedure: ICD GENERATOR CHANGE;  Surgeon: Evans Lance, MD;  Location: [facility] CATH LAB;  Service: Cardiovascular;  Laterality: N/A;  . Esophagogastroduodenoscopy N/A 04/16/2015    Procedure: ESOPHAGOGASTRODUODENOSCOPY (EGD);  Surgeon: [name], MD;  Location: [facility];  Service: Endoscopy;  Laterality: N/A;  . Colonoscopy N/A 04/16/2015    Procedure: COLONOSCOPY;  Surgeon: [name], MD;  Location: [facility] ENDOSCOPY;  Service: Endoscopy;  Laterality: N/A;   Family History  Problem Relation Age of Onset  . Stroke Mother   . Seizures Father   . Diabetes Sister   . Asthma Maternal Aunt     aunts  . Asthma Maternal Uncle     uncles  . Heart disease Father   . Heart disease Paternal Aunt     aunts  . Heart disease Paternal Uncle     uncles  . Heart disease Maternal Aunt     aunts  . Heart disease Maternal Uncle     uncles  . Heart disease Maternal Grandfather   . Colon cancer Neg Hx   . Colon polyps Neg Hx   . Esophageal cancer Neg Hx   . Kidney disease Neg Hx   . Gallbladder disease Neg Hx    Social History   Social History  . Marital Status: Widowed    Spouse Name: Alroy Dust  . Number of Children: 3  . Years of Education: 11   Occupational History  . disabled    Social History Main Topics  . Smoking status: Light Tobacco Smoker -- 0.30 packs/day for 35 years    Types: Cigarettes  . Smokeless tobacco: Never Used     Comment: currently smoking 3-4 cigs daily or less./ 1 pack every 3 days  . Alcohol Use: 0.6 oz/week    1 Glasses of wine per week     Comment:  Occassionally -weekends-wine  . Drug Use: No  . Sexual Activity: Not on file   Other Topics Concern  . Not on file   Social History Narrative   ** Merged History Encounter **       Married    Review of Systems: Review of Systems  Constitutional: Positive for fever and chills. Negative for weight loss.  Eyes: Negative for blurred vision and double vision.  Respiratory: Positive for cough, sputum production, shortness of breath and wheezing. Negative for hemoptysis.   Cardiovascular: Positive for chest pain, orthopnea, leg swelling and PND. Negative for palpitations.  Gastrointestinal: Negative for nausea, vomiting, abdominal pain, diarrhea and constipation.  Genitourinary: Negative for dysuria.  Musculoskeletal: Positive for back pain.  Skin: Negative for rash.  Neurological: Negative for dizziness, focal weakness and headaches.  Endo/Heme/Allergies: Does not bruise/bleed easily.   Physical Exam: Blood pressure 153/63, pulse 89, temperature 98.3 F (36.8 C), temperature source Oral, resp. rate 27, height 5' 0.25" (1.53 m), weight 236 lb (107.049 kg), SpO2 97 %.  GENERAL- obese, alert, co-operative, appears as stated age, in mild respiratory distress. HEENT- Atraumatic, normocephalic, PERRL, EOMI, oral mucosa appears moist. No carotid bruit, no cervical LN enlargement, thyroid does not appear enlarged, neck supple. CARDIAC- RRR, no murmurs, rubs or gallops. RESP- Decreased breath sounds  at lung bases, diffuse wheezes and rhonchi, no crackles ABDOMEN- Soft, nontender, no guarding or rebound, no palpable masses or organomegaly, bowel sounds present. BACK- tender to palpation over right sacroiliac joint, right gluteus maximus muscle and right proximal thigh. No erythema or rash. Normal sensation. Normal strength.  NEURO- AAOx3, No obvious Cr N abnormality, no focal deficits EXTREMITIES- pulse 2+, symmetric, 1+ pitting edema in bilateral LE SKIN- Warm, dry, No rash or lesion. PSYCH-  Normal mood and affect, appropriate thought content and speech.  Lab results: Basic Metabolic Panel:  Recent Labs  07/11/15 1423  NA 135  K 3.8  CL 100*  CO2 25  GLUCOSE 537*  BUN 5*  CREATININE 0.88  CALCIUM 8.7*   CBC:  Recent Labs  07/11/15 1423  WBC 10.4  NEUTROABS 7.4  HGB 13.2  HCT 42.2  MCV 84.9  PLT 222   CBG:  Recent Labs  07/11/15 1824  GLUCAP 387*   Urine Drug Screen: Drugs of Abuse     Component Value Date/Time   LABOPIA NONE DETECTED 05/15/2014 1141   COCAINSCRNUR NONE DETECTED 05/15/2014 1141   LABBENZ NONE DETECTED 05/15/2014 1141   AMPHETMU NONE DETECTED 05/15/2014 1141   THCU NONE DETECTED 05/15/2014 1141   LABBARB NONE DETECTED 05/15/2014 1141   Imaging results:  Dg Chest Portable 1 View  07/11/2015   CLINICAL DATA:  SOB x1 week. Pt was recently released from the hospital less than 2 weeks ago. Hx of COPD, CHF, DM, HTN,combined systolic and diastolic heart failure. Smoker.  EXAM: PORTABLE CHEST - 1 VIEW  COMPARISON:  06/18/2015  FINDINGS: Left subclavian AICD stable. Mild cardiomegaly stable. Patchy interstitial and airspace opacities in the lung bases and perihilar regions with increase since previous exam. Central peribronchial thickening. No pneumothorax. No effusion. Visualized skeletal structures are unremarkable.  IMPRESSION: 1. Mild perihilar and bibasilar vascular congestion or infiltrates. 2. Stable cardiomegaly   Electronically Signed   By: Lucrezia Europe M.D.   On: 07/11/2015 14:52    Other results: EKG: sinus tachycardia.  Assessment & Plan by Problem:  Ms. Moudy is a 64 year old female with a history of COPD and combined systolic and diastolic heart failure presenting with 1 week history of increasing shortness of breath and productive cough.   COPD Exacerbation: Patient with recent admission on 9/15 for COPD exacerbation. Symptoms of SOB and productive cough consistent with COPD exacerbation. BNP not elevated. CXR with mild  perihilar and bibasilar vascular congestion or infiltrates. Recent PFTs with moderate emphysema. CBC with 1.3 absolute eosinophils.  -CBC/BMET in am -Levaquin 750 mg PO daily -Prednisone 40 mg PO daily -duonebs q6hr  -COPD Gold Social Work consult -PT/OT -Keep O2 88-92% -Lasix 40 mg IV daily  Chest Pain: Non-radiating. Troponin negative x1. EKG with sinus tachycardia, no findings suggestive of MI. Likely 2/2 to CHF with volume overload.  - trend troponin  HYPERTENSION, BENIGN ESSENTIAL: BP well controlled in ED. On amlodipine 10 mg daily, lisionopril 40 mg daily and metoprolol succinate 50 mg daily at home. -Continue home meds  Type 2 diabetes mellitus with stage 2 chronic kidney disease: BG 537 on admission. Last A1c 11.6 in September.  -Lantus 27 units qam and qhs -SSI-resistant with HS correction -CBGs qac and qhs -Neurontin 300 mg qhs  Strain of lumbar spine: Patient with several week history or R lower back pain with radiation around hip to anterior proximal thigh. Reports constant 7/10 pain. Conservative treatment with no improvement. Denies any numbness, tingling, burning,  loss of bowel or bladder incontinence. Tender to palpation on exam.  -Lumbar spine XR  Combined Systolic/Diastolic HF: Last ECHO [id number] with EF 35-40% and grade 1 diastolic dysfunction. Mild LE edema. BNP normal. Takes Lasix 40 mg PO daily at home.  -Lasix 40 mg IV daily  FEN: Carb modified  DVT PPx: Lovenox  Dispo: Disposition is deferred at this time, awaiting improvement of current medical problems. Anticipated discharge in approximately 2-3 day(s).   The patient does have a current PCP (Alexa Sherral Hammers, MD) and does need an [facility] hospital follow-up appointment after discharge.  The patient does not have transportation limitations that hinder transportation to clinic appointments.  Signed: Maryellen Pile, MD 07/11/2015, 7:03 PM

## 2015-07-12 DIAGNOSIS — I5042 Chronic combined systolic (congestive) and diastolic (congestive) heart failure: Secondary | ICD-10-CM

## 2015-07-12 DIAGNOSIS — Z794 Long term (current) use of insulin: Secondary | ICD-10-CM

## 2015-07-12 DIAGNOSIS — M545 Low back pain: Secondary | ICD-10-CM

## 2015-07-12 DIAGNOSIS — E1165 Type 2 diabetes mellitus with hyperglycemia: Secondary | ICD-10-CM

## 2015-07-12 DIAGNOSIS — F17211 Nicotine dependence, cigarettes, in remission: Secondary | ICD-10-CM

## 2015-07-12 DIAGNOSIS — R079 Chest pain, unspecified: Secondary | ICD-10-CM

## 2015-07-12 DIAGNOSIS — N182 Chronic kidney disease, stage 2 (mild): Secondary | ICD-10-CM

## 2015-07-12 DIAGNOSIS — J441 Chronic obstructive pulmonary disease with (acute) exacerbation: Principal | ICD-10-CM

## 2015-07-12 DIAGNOSIS — E1122 Type 2 diabetes mellitus with diabetic chronic kidney disease: Secondary | ICD-10-CM

## 2015-07-12 DIAGNOSIS — I130 Hypertensive heart and chronic kidney disease with heart failure and stage 1 through stage 4 chronic kidney disease, or unspecified chronic kidney disease: Secondary | ICD-10-CM

## 2015-07-12 LAB — GLUCOSE, CAPILLARY
Glucose-Capillary: 266 mg/dL — ABNORMAL HIGH (ref 65–99)
Glucose-Capillary: 467 mg/dL — ABNORMAL HIGH (ref 65–99)
Glucose-Capillary: 420 mg/dL — ABNORMAL HIGH (ref 65–99)
Glucose-Capillary: 331 mg/dL — ABNORMAL HIGH (ref 65–99)
Glucose-Capillary: 455 mg/dL — ABNORMAL HIGH (ref 65–99)

## 2015-07-12 LAB — BASIC METABOLIC PANEL
Anion gap: 9 (ref 5–15)
BUN: 7 mg/dL (ref 6–20)
CO2: 34 mmol/L — ABNORMAL HIGH (ref 22–32)
Calcium: 8.9 mg/dL (ref 8.9–10.3)
Chloride: 97 mmol/L — ABNORMAL LOW (ref 101–111)
Creatinine, Ser: 0.7 mg/dL (ref 0.44–1.00)
GFR calc Af Amer: >60 mL/min (ref >60)
GFR calc non Af Amer: >60 mL/min (ref >60)
Glucose, Bld: 212 mg/dL — ABNORMAL HIGH (ref 65–99)
Potassium: 3 mmol/L — ABNORMAL LOW (ref 3.5–5.1)
Sodium: 140 mmol/L (ref 135–145)

## 2015-07-12 LAB — CBC
HCT: 39.7 % (ref 36.0–46.0)
Hemoglobin: 12.8 g/dL (ref 12.0–15.0)
MCH: 27.1 pg (ref 26.0–34.0)
MCHC: 32.2 g/dL (ref 30.0–36.0)
MCV: 84.1 fL (ref 78.0–100.0)
Platelets: 240 10*3/uL (ref 150–400)
RBC: 4.72 MIL/uL (ref 3.87–5.11)
RDW: 15.1 % (ref 11.5–15.5)
WBC: 12 10*3/uL — ABNORMAL HIGH (ref 4.0–10.5)

## 2015-07-12 LAB — TROPONIN I: Troponin I: 0.04 ng/mL — ABNORMAL HIGH (ref <0.031)

## 2015-07-12 MED ORDER — KETOROLAC TROMETHAMINE 15 MG/ML IJ SOLN
15.0000 mg | Freq: Once | INTRAMUSCULAR | Status: AC
  Administered 2015-07-12: 15 mg via INTRAVENOUS
  Filled 2015-07-12: qty 1

## 2015-07-12 MED ORDER — GABAPENTIN 300 MG PO CAPS
600.0000 mg | ORAL_CAPSULE | Freq: Every day | ORAL | Status: DC
  Administered 2015-07-12 – 2015-07-13 (×2): 600 mg via ORAL
  Filled 2015-07-12 (×2): qty 2

## 2015-07-12 MED ORDER — IPRATROPIUM-ALBUTEROL 0.5-2.5 (3) MG/3ML IN SOLN
3.0000 mL | Freq: Four times a day (QID) | RESPIRATORY_TRACT | Status: DC
  Administered 2015-07-13 (×3): 3 mL via RESPIRATORY_TRACT
  Filled 2015-07-12 (×3): qty 3

## 2015-07-12 MED ORDER — POTASSIUM CHLORIDE CRYS ER 20 MEQ PO TBCR
40.0000 meq | EXTENDED_RELEASE_TABLET | Freq: Once | ORAL | Status: AC
  Administered 2015-07-12: 40 meq via ORAL
  Filled 2015-07-12: qty 2

## 2015-07-12 MED ORDER — INSULIN ASPART 100 UNIT/ML ~~LOC~~ SOLN
5.0000 [IU] | Freq: Once | SUBCUTANEOUS | Status: AC
  Administered 2015-07-12: 5 [IU] via SUBCUTANEOUS

## 2015-07-12 MED ORDER — INSULIN ASPART 100 UNIT/ML ~~LOC~~ SOLN
5.0000 [IU] | Freq: Three times a day (TID) | SUBCUTANEOUS | Status: DC
  Administered 2015-07-12: 5 [IU] via SUBCUTANEOUS

## 2015-07-12 MED ORDER — PNEUMOCOCCAL VAC POLYVALENT 25 MCG/0.5ML IJ INJ
0.5000 mL | INJECTION | INTRAMUSCULAR | Status: AC
  Administered 2015-07-13: 0.5 mL via INTRAMUSCULAR
  Filled 2015-07-12: qty 0.5

## 2015-07-12 MED ORDER — IPRATROPIUM-ALBUTEROL 0.5-2.5 (3) MG/3ML IN SOLN
3.0000 mL | RESPIRATORY_TRACT | Status: DC
  Administered 2015-07-12 (×3): 3 mL via RESPIRATORY_TRACT
  Filled 2015-07-12 (×3): qty 3

## 2015-07-12 MED ORDER — IPRATROPIUM-ALBUTEROL 0.5-2.5 (3) MG/3ML IN SOLN
3.0000 mL | RESPIRATORY_TRACT | Status: DC | PRN
  Administered 2015-07-13: 3 mL via RESPIRATORY_TRACT
  Filled 2015-07-12: qty 3

## 2015-07-12 MED ORDER — BENZONATATE 100 MG PO CAPS
100.0000 mg | ORAL_CAPSULE | Freq: Three times a day (TID) | ORAL | Status: DC | PRN
  Administered 2015-07-12: 100 mg via ORAL
  Filled 2015-07-12: qty 1

## 2015-07-12 MED ORDER — FUROSEMIDE 40 MG PO TABS
40.0000 mg | ORAL_TABLET | Freq: Every day | ORAL | Status: DC
  Administered 2015-07-13 – 2015-07-14 (×2): 40 mg via ORAL
  Filled 2015-07-12 (×2): qty 1

## 2015-07-12 NOTE — H&P (Signed)
   Date: 07/12/2015  Patient name: [name]  Medical record number: [id number]  Date of birth: 1951-07-21   I have seen and evaluated [name] and discussed their care with the Residency Team.   Assessment and Plan: I have seen and evaluated the patient as outlined above. I agree with the formulated Assessment and Plan as detailed in the residents' admission note. [name] was admitted with worsening dyspnea on exertion over the past week. She had one subjective fever. She has had some slight increase in her chronic cough which is occasionally productive of thin white sputum. She just recently quit smoking. She does not appear to be above her recent dry weight. I do not believe this is an acute coronary syndrome or heart failure. I believe she has an exacerbation of her chronic lung disease. She is improving with nebulizer treatments and steroids. I do not think she needs antibiotic therapy.  Michel Bickers, MD 10/9/201610:37 AM

## 2015-07-12 NOTE — Evaluation (Signed)
Physical Therapy Evaluation Patient Details Name: [name] MRN: [id number] DOB: [birth year] Today's Date: 07/12/2015   History of Present Illness  [name] is a 63 year old female with a history of COPD, combined systolic and diastolic heart failure, obesity, HTN, insulin dependant DM type 2 presents to [facility] ED with 1 week history of worsening shortness of breath.  Clinical Impression  Pt admitted with above complications. Pt currently with functional limitations due to the deficits listed below (see PT Problem List). Presents with generalized weakness and difficulty ambulating due to fatigue and drop in SpO2 (87% on room air.) She is motivated to improve her abilities and would benefit from follow-up PT at home to further progress her strength, endurance, and safety with mobility. Pt will benefit from skilled PT to increase their independence and safety with mobility to allow discharge to the venue listed below.       Follow Up Recommendations Home health PT;Supervision - Intermittent    Equipment Recommendations  Other (comment) (Rollator)    Recommendations for Other Services       Precautions / Restrictions Precautions Precautions: Fall Precaution Comments: monitor O2 Restrictions Weight Bearing Restrictions: No      Mobility  Bed Mobility Overal bed mobility: Needs Assistance Bed Mobility: Supine to Sit;Sit to Supine     Supine to sit: Supervision Sit to supine: Supervision   General bed mobility comments: requires extra time, VC for technique and use of rail.  Transfers Overall transfer level: Needs assistance Equipment used: Rolling walker (2 wheeled) Transfers: Sit to/from Stand Sit to Stand: Supervision         General transfer comment: supervision for safety. VC for  hand placement  Ambulation/Gait Ambulation/Gait assistance: Supervision Ambulation Distance (Feet): 130 Feet Assistive device: Rolling walker (2 wheeled) Gait Pattern/deviations:  Step-through pattern;Decreased stride length;Trunk flexed Gait velocity: decreased Gait velocity interpretation: Below normal speed for age/gender General Gait Details: Slow but stable. Required 4 standing rest breaks to safely complete distances. See vitals below for SpO2 recordings (87% on room air). VC for walker placement for proximity, energy conservation techniques, and pursed lip breathing. No overt loss of balance or need for physical assist during this distance  Stairs            Wheelchair Mobility    Modified Rankin (Stroke Patients Only)       Balance Overall balance assessment: Needs assistance;History of Falls Sitting-balance support: No upper extremity supported;Feet supported Sitting balance-Leahy Scale: Good     Standing balance support: No upper extremity supported Standing balance-Leahy Scale: Fair                               Pertinent Vitals/Pain Pain Assessment: No/denies pain    Home Living Family/patient expects to be discharged to:: Private residence Living Arrangements: Alone Available Help at Discharge: Friend(s);Available PRN/intermittently Type of Home: House Home Access: Stairs to enter Entrance Stairs-Rails: Psychiatric nurse of Steps: 4 Home Layout: One level Home Equipment: Tub bench;Walker - standard;Cane - single point      Prior Function Level of Independence: Independent with assistive device(s)         Comments: Uses cane and RW at times. States she can bath herself but gets very tired.     Hand Dominance   Dominant Hand: Right    Extremity/Trunk Assessment   Upper Extremity Assessment: Defer to OT evaluation  Lower Extremity Assessment: Generalized weakness         Communication   Communication: No difficulties  Cognition Arousal/Alertness: Awake/alert Behavior During Therapy: WFL for tasks assessed/performed Overall Cognitive Status: Within Functional Limits for  tasks assessed                      General Comments General comments (skin integrity, edema, etc.): Discussed equipment use for energy conservation    Exercises        Assessment/Plan    PT Assessment Patient needs continued PT services  PT Diagnosis Difficulty walking;Abnormality of gait;Generalized weakness   PT Problem List Decreased strength;Decreased activity tolerance;Decreased balance;Decreased mobility;Decreased knowledge of use of DME;Cardiopulmonary status limiting activity;Obesity  PT Treatment Interventions DME instruction;Gait training;Stair training;Functional mobility training;Therapeutic activities;Therapeutic exercise;Balance training;Neuromuscular re-education;Patient/family education   PT Goals (Current goals can be found in the Care Plan section) Acute Rehab PT Goals Patient Stated Goal: feel better PT Goal Formulation: With patient Time For Goal Achievement: 07/26/15 Potential to Achieve Goals: Good    Frequency Min 3X/week   Barriers to discharge Decreased caregiver support lives alone    Co-evaluation               End of Session Equipment Utilized During Treatment: Oxygen Activity Tolerance: Patient tolerated treatment well Patient left: in bed;with call bell/phone within reach;with bed alarm set Nurse Communication: Mobility status         Time: 1137-1200 PT Time Calculation (min) (ACUTE ONLY): 23 min   Charges:   PT Evaluation $Initial PT Evaluation Tier I: 1 Procedure PT Treatments $Gait Training: 8-22 mins   PT G CodesEllouise Newer 07/12/2015, 1:03 PM Camille Bal [place], [place]

## 2015-07-12 NOTE — Progress Notes (Signed)
SATURATION QUALIFICATIONS: (This note is used to comply with regulatory documentation for home oxygen)  Patient Saturations on Room Air at Rest = 88%  Patient Saturations on Room Air while Ambulating = 87%  Patient Saturations on 1 Liters of oxygen while Ambulating = 92% - with 3/4 dyspnea  Patient Saturations on 2 Liters of oxygen while Ambulating = 96% - with 2/4 dyspnea (comfortable for patient)   Please briefly explain why patient needs home oxygen: Patient currently requires supplemental O2 to maintain safe SpO2 level while ambulating.   [address] [place], [place]

## 2015-07-12 NOTE — Progress Notes (Addendum)
Subjective: Patient seen and examined this morning. Reports some improvement in shortness of breath with the breathing treatments. Complaining of right hip and leg pain.   Objective: Vital signs in last 24 hours: Filed Vitals:   07/12/15 0509 07/12/15 0756 07/12/15 0838 07/12/15 1213  BP: 131/67  124/65   Pulse: 96  102   Temp: 98 F (36.7 C)     TempSrc: Oral     Resp: 17     Height:      Weight:      SpO2: 96% 96%  96%   Weight change:   Intake/Output Summary (Last 24 hours) at 07/12/15 1257 Last data filed at 07/12/15 1100  Gross per 24 hour  Intake    760 ml  Output   1550 ml  Net   -790 ml    Physical Exam: GENERAL- obese, alert, co-operative, appears as stated age, in no distress. HEENT- Atraumatic, normocephalic CARDIAC- RRR, no murmurs, rubs or gallops. RESP- Decreased breath sounds at lung bases, diffuse wheezes. No rhonchi, no crackles ABDOMEN- Soft, nontender, no guarding or rebound, no palpable masses or organomegaly, bowel sounds present. BACK- tender to palpation over right sacroiliac joint, right gluteus maximus muscle and right proximal thigh. No erythema or rash. Normal sensation. Normal strength.  NEURO- AAOx3 EXTREMITIES- pulse 2+, symmetric, trace pedal edema SKIN- Warm, dry, No rash or lesion. PSYCH- Normal mood and affect, appropriate thought content and speech.   Medications: I have reviewed the patient's current medications. Scheduled Meds: . amLODipine  10 mg Oral Daily  . aspirin EC  81 mg Oral Daily  . atorvastatin  80 mg Oral QHS  . enoxaparin (LOVENOX) injection  40 mg Subcutaneous Q24H  . furosemide  40 mg Intravenous Daily  . gabapentin  300 mg Oral QHS  . insulin aspart  0-20 Units Subcutaneous TID WC  . insulin aspart  0-5 Units Subcutaneous QHS  . insulin glargine  27 Units Subcutaneous BID  . ipratropium-albuterol  3 mL Nebulization Q4H  . lisinopril  40 mg Oral Daily  . metoprolol succinate  50 mg Oral Daily  . [START ON  07/13/2015] pneumococcal 23 valent vaccine  0.5 mL Intramuscular Tomorrow-1000  . potassium chloride  40 mEq Oral Once  . predniSONE  40 mg Oral Q breakfast  . sodium chloride  3 mL Intravenous Q12H   Continuous Infusions:  PRN Meds:.sodium chloride, acetaminophen **OR** acetaminophen, nitroGLYCERIN, sodium chloride Assessment/Plan:  COPD Exacerbation: Subjective dyspnea improving somewhat. Still having some decreased breath sounds at lung bases and diffuse wheezing but improved from yesterday. Continues to have cough with some mild clear sputum production. desatted to 87% while ambulating.  -duonebs q4hr -D/C levaquin -daily bmet/cbc -lasix 40 mg iv daily -prednisone 40 mg daily x 5 days -pt/ot  Chest Pain: Still having intermittent chest pain. Non-radiating. Troponin negative x3. Repeat EKG with NSR.   HYPERTENSION, BENIGN ESSENTIAL: BP well controlled in ED. On amlodipine 10 mg daily, lisionopril 40 mg daily and metoprolol succinate 50 mg daily at home. -Continue home meds  Type 2 diabetes mellitus with stage 2 chronic kidney disease: CBGs still difficult to control. Last A1c 11.6 in September.  -Lantus 27 units qam and qhs -SSI-resistant with HS correction -CBGs qac and qhs -Neurontin 600 mg qhs  Strain of lumbar spine: Contnues to complain of right hip and leg pain. Lumbar spine XR with no evidence of fracture or subluxation.  -Toradol -increase gabapentin to 600 mg qhs  Combined Systolic/Diastolic HF: Last  ECHO 03/2014 with EF 35-40% and grade 1 diastolic dysfunction. Mild LE edema. BNP normal. Takes Lasix 40 mg PO daily at home.  -Resume home dose lasix tomorrow  FEN: Carb modified  DVT PPx: Lovenox  Dispo: Disposition is deferred at this time, awaiting improvement of current medical problems.  Anticipated discharge in approximately 1-2 day(s).   The patient does have a current PCP (Alexa Sherral Hammers, MD) and does need an [facility] hospital follow-up appointment after  discharge.  The patient does not have transportation limitations that hinder transportation to clinic appointments.  .Services Needed at time of discharge: Y = Yes, Blank = No PT:   OT:   RN:   Equipment:   Other:     LOS: 1 day   Maryellen Pile, MD IMTS PGY-1 [phone] 07/12/2015, 12:57 PM

## 2015-07-13 DIAGNOSIS — Z9981 Dependence on supplemental oxygen: Secondary | ICD-10-CM | POA: Diagnosis not present

## 2015-07-13 DIAGNOSIS — I5042 Chronic combined systolic (congestive) and diastolic (congestive) heart failure: Secondary | ICD-10-CM | POA: Diagnosis not present

## 2015-07-13 DIAGNOSIS — M5416 Radiculopathy, lumbar region: Secondary | ICD-10-CM

## 2015-07-13 DIAGNOSIS — E0590 Thyrotoxicosis, unspecified without thyrotoxic crisis or storm: Secondary | ICD-10-CM | POA: Diagnosis not present

## 2015-07-13 DIAGNOSIS — F329 Major depressive disorder, single episode, unspecified: Secondary | ICD-10-CM | POA: Diagnosis not present

## 2015-07-13 DIAGNOSIS — F17210 Nicotine dependence, cigarettes, uncomplicated: Secondary | ICD-10-CM | POA: Diagnosis not present

## 2015-07-13 DIAGNOSIS — Z79899 Other long term (current) drug therapy: Secondary | ICD-10-CM | POA: Diagnosis not present

## 2015-07-13 DIAGNOSIS — E1122 Type 2 diabetes mellitus with diabetic chronic kidney disease: Secondary | ICD-10-CM | POA: Diagnosis not present

## 2015-07-13 DIAGNOSIS — J441 Chronic obstructive pulmonary disease with (acute) exacerbation: Secondary | ICD-10-CM | POA: Diagnosis not present

## 2015-07-13 DIAGNOSIS — Z95810 Presence of automatic (implantable) cardiac defibrillator: Secondary | ICD-10-CM | POA: Diagnosis not present

## 2015-07-13 DIAGNOSIS — I130 Hypertensive heart and chronic kidney disease with heart failure and stage 1 through stage 4 chronic kidney disease, or unspecified chronic kidney disease: Secondary | ICD-10-CM | POA: Diagnosis not present

## 2015-07-13 DIAGNOSIS — I10 Essential (primary) hypertension: Secondary | ICD-10-CM

## 2015-07-13 DIAGNOSIS — I429 Cardiomyopathy, unspecified: Secondary | ICD-10-CM | POA: Diagnosis not present

## 2015-07-13 DIAGNOSIS — I129 Hypertensive chronic kidney disease with stage 1 through stage 4 chronic kidney disease, or unspecified chronic kidney disease: Secondary | ICD-10-CM | POA: Diagnosis not present

## 2015-07-13 DIAGNOSIS — N182 Chronic kidney disease, stage 2 (mild): Secondary | ICD-10-CM | POA: Diagnosis not present

## 2015-07-13 DIAGNOSIS — M5116 Intervertebral disc disorders with radiculopathy, lumbar region: Secondary | ICD-10-CM | POA: Diagnosis present

## 2015-07-13 DIAGNOSIS — Z6841 Body Mass Index (BMI) 40.0 and over, adult: Secondary | ICD-10-CM | POA: Diagnosis not present

## 2015-07-13 LAB — CBC
HCT: 39 % (ref 36.0–46.0)
Hemoglobin: 12.4 g/dL (ref 12.0–15.0)
MCH: 26.8 pg (ref 26.0–34.0)
MCHC: 31.8 g/dL (ref 30.0–36.0)
MCV: 84.2 fL (ref 78.0–100.0)
Platelets: 252 10*3/uL (ref 150–400)
RBC: 4.63 MIL/uL (ref 3.87–5.11)
RDW: 15.2 % (ref 11.5–15.5)
WBC: 14.3 10*3/uL — ABNORMAL HIGH (ref 4.0–10.5)

## 2015-07-13 LAB — GLUCOSE, CAPILLARY
Glucose-Capillary: 202 mg/dL — ABNORMAL HIGH (ref 65–99)
Glucose-Capillary: 202 mg/dL — ABNORMAL HIGH (ref 65–99)
Glucose-Capillary: 239 mg/dL — ABNORMAL HIGH (ref 65–99)
Glucose-Capillary: 376 mg/dL — ABNORMAL HIGH (ref 65–99)
Glucose-Capillary: 438 mg/dL — ABNORMAL HIGH (ref 65–99)
Glucose-Capillary: 455 mg/dL — ABNORMAL HIGH (ref 65–99)

## 2015-07-13 LAB — BASIC METABOLIC PANEL
Anion gap: 9 (ref 5–15)
BUN: 22 mg/dL — ABNORMAL HIGH (ref 6–20)
CO2: 34 mmol/L — ABNORMAL HIGH (ref 22–32)
Calcium: 9.1 mg/dL (ref 8.9–10.3)
Chloride: 95 mmol/L — ABNORMAL LOW (ref 101–111)
Creatinine, Ser: 1.06 mg/dL — ABNORMAL HIGH (ref 0.44–1.00)
GFR calc Af Amer: >60 mL/min (ref >60)
GFR calc non Af Amer: 55 mL/min — ABNORMAL LOW (ref >60)
Glucose, Bld: 258 mg/dL — ABNORMAL HIGH (ref 65–99)
Potassium: 3.6 mmol/L (ref 3.5–5.1)
Sodium: 138 mmol/L (ref 135–145)

## 2015-07-13 MED ORDER — INSULIN ASPART 100 UNIT/ML ~~LOC~~ SOLN
8.0000 [IU] | Freq: Three times a day (TID) | SUBCUTANEOUS | Status: DC
  Administered 2015-07-13 (×2): 8 [IU] via SUBCUTANEOUS

## 2015-07-13 MED ORDER — BENZONATATE 100 MG PO CAPS: 200.0000 mg | ORAL_CAPSULE | Freq: Three times a day (TID) | ORAL | Status: DC | PRN

## 2015-07-13 MED ORDER — INSULIN ASPART 100 UNIT/ML ~~LOC~~ SOLN
12.0000 [IU] | Freq: Three times a day (TID) | SUBCUTANEOUS | Status: DC
  Administered 2015-07-13 – 2015-07-14 (×3): 12 [IU] via SUBCUTANEOUS

## 2015-07-13 MED ORDER — HYDROCOD POLST-CPM POLST ER 10-8 MG/5ML PO SUER
5.0000 mL | Freq: Two times a day (BID) | ORAL | Status: DC
  Administered 2015-07-13 – 2015-07-14 (×2): 5 mL via ORAL
  Filled 2015-07-13 (×2): qty 5

## 2015-07-13 MED ORDER — GLUCERNA SHAKE PO LIQD
237.0000 mL | Freq: Two times a day (BID) | ORAL | Status: DC
  Administered 2015-07-13 – 2015-07-14 (×3): 237 mL via ORAL

## 2015-07-13 MED ORDER — TIOTROPIUM BROMIDE MONOHYDRATE 18 MCG IN CAPS
18.0000 ug | ORAL_CAPSULE | Freq: Every day | RESPIRATORY_TRACT | Status: DC
  Administered 2015-07-14: 18 ug via RESPIRATORY_TRACT
  Filled 2015-07-13: qty 5

## 2015-07-13 MED ORDER — INSULIN GLARGINE 100 UNIT/ML ~~LOC~~ SOLN
37.0000 [IU] | Freq: Two times a day (BID) | SUBCUTANEOUS | Status: DC
  Administered 2015-07-13 – 2015-07-14 (×2): 37 [IU] via SUBCUTANEOUS
  Filled 2015-07-13 (×4): qty 0.37

## 2015-07-13 NOTE — Care Management Note (Addendum)
Case Management Note  Patient Details  Name: [name] MRN: [id number] Date of Birth: 27-Aug-1951  Subjective/Objective:             Date-07-13-15 Monday Initial Assessment Spoke with patient at the bedside.  Introduced self as Tourist information centre manager and explained role in discharge planning and how to be reached.  Verified patient lives at home alone.  Verified patient anticipates to go home alone, at time of discharge and will have part-time limited supervision by friends at this time to best of their knowledge.  Patient has DME cane walker oxygen through [facility] nebulizer. Expressed potential need for rolator.  Patient denied  needing help with their medication.  Patient is driven by family friends to MD appointments.  Verified patient has PCP internal medicine clinic  Patient was provided choice and selected [facility] for home health needs if they arise.  Plan: CM will continue to follow for discharge planning and [facility] resources.   Carles Collet RN BSN CM [phone]        Action/Plan:  07-13-15 Referral made to [facility] for [facility] PT, Referral placed to [facility] for DME rolator, will be delivered to room prior to discharge.    Expected Discharge Date:  07/14/15               Expected Discharge Plan:  [place]  In-House Referral:     Discharge planning Services  CM Consult  Post Acute Care Choice:    Choice offered to:     DME Arranged:    DME Agency:     HH Arranged:    [place] Agency:     Status of Service:  In process, will continue to follow  Medicare Important Message Given:    Date Medicare IM Given:    Medicare IM give by:    Date Additional Medicare IM Given:    Additional Medicare Important Message give by:     If discussed at [place] of Stay Meetings, dates discussed:    Additional Comments:  Carles Collet, RN 07/13/2015, 11:35 AM

## 2015-07-13 NOTE — Discharge Summary (Signed)
Name: [name] MRN: [id number] DOB: 1951/05/27 64 y.o. PCP: Vickii Chafe, MD  Date of Admission: 07/11/2015  1:46 PM Date of Discharge: 07/15/2015 Attending Physician: No att. providers found  Discharge Diagnosis: Principal Problem:   COPD exacerbation ([place]) Active Problems:   HYPERTENSION, BENIGN ESSENTIAL   Nonischemic cardiomyopathy ([place])   Type 2 diabetes mellitus with stage 2 chronic kidney disease ([place])   Lumbar radiculopathy  Discharge Medications:   Medication List    STOP taking these medications        doxycycline 100 MG capsule  Commonly known as:  VIBRAMYCIN     polyethylene glycol powder powder  Commonly known as:  GLYCOLAX/MIRALAX      TAKE these medications        albuterol 108 (90 BASE) MCG/ACT inhaler  Commonly known as:  VENTOLIN HFA  Inhale 2 puffs into the lungs every 6 (six) hours as needed. Shortness of breath     albuterol (2.5 MG/3ML) 0.083% nebulizer solution  Commonly known as:  PROVENTIL  USE 1 VIAL VIA NEBULIZER EVERY 6 HOURS AS NEEDED FOR WHEEZING OR SHORTNESS OF BREATH     amLODipine 10 MG tablet  Commonly known as:  NORVASC  TAKE 1 TABLET BY MOUTH DAILY     aspirin EC 81 MG tablet  Take 81 mg by mouth daily.     atorvastatin 80 MG tablet  Commonly known as:  LIPITOR  Take 1 tablet (80 mg total) by mouth at bedtime.     furosemide 20 MG tablet  Commonly known as:  LASIX  Take 2 tablets (40 mg total) by mouth daily.     gabapentin 300 MG capsule  Commonly known as:  NEURONTIN  Take 1 capsule (300 mg total) by mouth at bedtime.     glucose blood test strip  Commonly known as:  ONE TOUCH ULTRA TEST  Use as instructed by doctor up to 3x daily,dx code 250.02 insulin requiring     guaiFENesin-dextromethorphan 100-10 MG/5ML syrup  Commonly known as:  ROBITUSSIN DM  Take 5 mLs by mouth every 4 (four) hours as needed for cough.     insulin glargine 100 UNIT/ML injection  Commonly known as:  LANTUS  Inject 27 Units  into the skin daily.     insulin lispro 100 UNIT/ML KiwkPen  Commonly known as:  HUMALOG KWIKPEN  Inject 5 units with breakfast and 5 units with dinner, hold if CBGs low or not eating and call MD     lisinopril 40 MG tablet  Commonly known as:  PRINIVIL,ZESTRIL  Take 1 tablet (40 mg total) by mouth daily.     metoprolol succinate 50 MG 24 hr tablet  Commonly known as:  TOPROL-XL  Take 1 tablet (50 mg total) by mouth daily.     naproxen 500 MG tablet  Commonly known as:  NAPROSYN  Take 1 tablet (500 mg total) by mouth 2 (two) times daily with a meal.     nitroGLYCERIN 0.4 MG SL tablet  Commonly known as:  NITROSTAT  Place 0.4 mg under the tongue every 5 (five) minutes as needed for chest pain.     omeprazole 20 MG capsule  Commonly known as:  PRILOSEC  Take one tablet daily 30 minutes before breakfast     predniSONE 20 MG tablet  Commonly known as:  DELTASONE  Take 2 tablets (40 mg total) by mouth daily with breakfast.     tiotropium 18 MCG inhalation capsule  Commonly known  as:  SPIRIVA  Place 1 capsule (18 mcg total) into inhaler and inhale daily.     tiotropium 18 MCG inhalation capsule  Commonly known as:  SPIRIVA HANDIHALER  Place 1 capsule (18 mcg total) into inhaler and inhale every morning.       Disposition and follow-up:   [name] was discharged from [facility] in Stable condition.  At the hospital follow up visit please address:  1. COPD exacerbation: Please assess for resolution of her shortness of breath and cough. She was started on Spiriva and should have completed steroid course on 10/12. Please assess compliance with the Spiriva and how frequently she is requiring albuterol. She reports she quit smoking after last admission. 2. DM: Her blood sugars were difficult to control. Discharged on Levemir 27 and Novolog 5 qac bid. Please check her meter and adjust dosing as needed.  3. Lumbar strain: Complaining of right hip and thigh  pain. Lumbar spine x-ray with no evidence of fracture or subluxation. Failed conservative treatment. On gabapentin 600 qhs.  4.  Labs / imaging needed at time of follow-up: CBG 5.  Pending labs/ test needing follow-up: None  Follow-up Appointments: Follow-up Information    Follow up with [facility].   Why:  Rolator will be delivered to room prior to discharge.   Contact information:   [address] [place] [address] [phone]       Follow up with [facility].   Why:  [place] [facility]. Will call in next 24 to 48 hours to schedule first [facility] visit.   Contact information:   [address] [place] [address] [phone]       Follow up with Osa Craver, MD On 07/22/2015.   Specialty:  Internal Medicine   Why:  1:15 PM   Contact information:   [place] [address] [phone]       Discharge Instructions: Discharge Instructions    Diet - low sodium heart healthy    Complete by:  As directed      Increase activity slowly    Complete by:  As directed            Consultations:    Procedures Performed:  Dg Lumbar Spine 2-3 Views  07/11/2015  CLINICAL DATA:  Acute onset of left-sided lower back pain. Initial encounter. EXAM: LUMBAR SPINE - 2-3 VIEW COMPARISON:  CT of the abdomen and pelvis from 02/26/2015 FINDINGS: There is no evidence of fracture or subluxation. Vertebral bodies demonstrate normal height and alignment. Intervertebral disc spaces are preserved. Small anterior osteophytes are seen along the lower thoracic and lumbar spine. Facet disease is noted at the lower lumbar spine. The visualized bowel gas pattern is unremarkable in appearance; air and stool are noted within the colon. The sacroiliac joints are within normal limits. IMPRESSION: No evidence of fracture or subluxation along the lumbar spine. Electronically Signed   By: Garald Balding M.D.   On: 07/11/2015 20:06   Dg Chest Portable 1  View  07/11/2015  CLINICAL DATA:  SOB x1 week. Pt was recently released from the hospital less than 2 weeks ago. Hx of COPD, CHF, DM, HTN,combined systolic and diastolic heart failure. Smoker. EXAM: PORTABLE CHEST - 1 VIEW COMPARISON:  06/18/2015 FINDINGS: Left subclavian AICD stable. Mild cardiomegaly stable. Patchy interstitial and airspace opacities in the lung bases and perihilar regions with increase since previous exam. Central peribronchial thickening. No pneumothorax.  No effusion. Visualized skeletal structures are unremarkable. IMPRESSION: 1. Mild perihilar and bibasilar vascular congestion or infiltrates. 2. Stable cardiomegaly Electronically Signed   By: Lucrezia Europe M.D.   On: 07/11/2015 14:52   Dg Chest Portable 1 View  06/18/2015  CLINICAL DATA:  Shortness of breath. EXAM: PORTABLE CHEST - 1 VIEW COMPARISON:  April 08, 2015. FINDINGS: Stable cardiomegaly. Left-sided pacemaker is unchanged in position. No pneumothorax or pleural effusion is noted. No acute pulmonary disease is noted. Bony thorax is intact. IMPRESSION: No acute cardiopulmonary abnormality seen. Electronically Signed   By: Marijo Conception, M.D.   On: 06/18/2015 11:30   Admission HPI:  Ms. Klepinger is a 64 year old female with a history of COPD, combined systolic and diastolic heart failure, obesity, HTN, insulin dependant DM type 2 presents to [facility] ED with 1 week history of worsening shortness of breath. She reports that over the past weeks she has had worsening shortness of breath anda productive cough with clear sputum. She reports a subjective fever and chills 2 nights ago which resolved. She has been using her albuterol nebulizer every 6 hours at home with no relief of her symptoms. She is on 2L of oxygen at night normally but has been using it during the day the past week due to shortness of breath. She admits to chronic LE edema that is unchanged from previous. She has been taking her Lasix 40 mg daily and urinating normally. She  reports that she is normally barely able to walk to the bathroom and back due to dyspnea and is worsening with dyspnea with any exertion. She cannot lay flat at night using 3 pillows at night. Wakes up gasping at night. Reports previous sleep study was normal. She also report substernal chest pain without radiation. Describes 8/10 constant chest pressure for the past week, no worse with inspiration. Reports it is similar to pain she had years ago when she was first diagnosed with CHF. No recent weight changes.  Hospital Course by problem list: Principal Problem:   COPD exacerbation ([place]) Active Problems:   HYPERTENSION, BENIGN ESSENTIAL   Nonischemic cardiomyopathy ([place])   Type 2 diabetes mellitus with stage 2 chronic kidney disease ([facility])   Lumbar radiculopathy   COPD Exacerbation: Ms. Covill was treated for a COPD exacerbation with duonebs, supplemental O2 and prednisone 40 mg daily. She had increasing shortness of breath with mildly productive cough and no sputum changes, did not believe that ABX therapy was needed. CXR revealed mild perihilar and bibasilar vascular congestion or infiltrates. Recent PFTs with moderate emphysema. Her vitals were stable during hospitalization but did require supplemental O2 with ambulation as she desatted to 87%. She was transitioned to tiotropium (spriva) 18 mcg once daily prior to discharge with her albuterol prn. To complete a 5 day course of Prednisone 40 mg daily on 10/12. She was seen and evaluated by PT and recommended home health PT and rolling walker.   Chest Pain: Non-radiating chest pain that is intermittent and resolved during hospital stay. Troponin negative x3. EKGs with NSR, no findings suggestive of MI.  HYPERTENSION, BENIGN ESSENTIAL: BP well controlled during hospitalization. On amlodipine 10 mg daily, lisionopril 40 mg daily and metoprolol succinate 50 mg daily at home. Continued home medications.  Type 2 diabetes mellitus with stage 2 chronic  kidney disease: BG 537 was on admission. Last A1c 11.6 in September. Her blood sugars have been difficult to control in the past and exacerbated by the steroid course. She was  continued on her home dose Lantus 27 units bid. Up-titrated her meal time insulin to 8 units tid. Discharged on her home dose.   Strain of lumbar spine: Patient with several week history or R lower back pain with radiation around hip to anterior proximal thigh. Reported constant 7/10 pain. Conservative treatment with no improvement. Denied any numbness, tingling, burning, loss of bowel or bladder incontinence. She was tender to palpation on exam. Lumbar spine XR with no evidence of fracture of subluxation. We increased her gabapentin to 600 mg qhs. Reported no drowsiness on increased dose with some improvement in pain.   Combined Systolic/Diastolic HF: Last ECHO Q000111Q with EF 35-40% and grade 1 diastolic dysfunction. Mild LE edema. BNP was normal. She takes Lasix 40 mg PO daily at home. We continued her home dose lasix.   Discharge Vitals:   BP 129/53 mmHg  Pulse 89  Temp(Src) 97.9 F (36.6 C) (Oral)  Resp 16  Ht 5' 0.25" (1.53 m)  Wt 236 lb (107.049 kg)  BMI 45.73 kg/m2  SpO2 96%  Discharge Labs:  Results for orders placed or performed during the hospital encounter of 07/11/15 (from the past 24 hour(s))  Glucose, capillary     Status: Abnormal   Collection Time: 07/14/15  5:38 PM  Result Value Ref Range   Glucose-Capillary 275 (H) 65 - 99 mg/dL    Signed: Maryellen Pile, MD 07/15/2015, 4:30 PM   Services Ordered on Discharge: [place] on Discharge: Conservation officer, nature with [place]

## 2015-07-13 NOTE — Progress Notes (Signed)
Utilization review completed. Keily Lepp, RN, BSN. 

## 2015-07-13 NOTE — Progress Notes (Signed)
Subjective: Patient seen and examined this morning. Shortness of breath improving. Still coughing, no production.   Objective: Vital signs in last 24 hours: Filed Vitals:   07/12/15 2149 07/13/15 0543 07/13/15 0938 07/13/15 1405  BP: 120/56 121/68  130/71  Pulse: 106 91  95  Temp: 98.6 F (37 C) 98.2 F (36.8 C)  98 F (36.7 C)  TempSrc: Oral Oral  Oral  Resp: 18 16  16   Height:      Weight:      SpO2: 96% 97% 100% 100%   Weight change:   Intake/Output Summary (Last 24 hours) at 07/13/15 1445 Last data filed at 07/13/15 1402  Gross per 24 hour  Intake   1320 ml  Output    900 ml  Net    420 ml    Physical Exam: GENERAL- obese, alert, co-operative, appears as stated age, in no distress. HEENT- Atraumatic, normocephalic CARDIAC- RRR, no murmurs, rubs or gallops. RESP- Mild diffuse expiratory wheezing. No rhonchi, no crackles ABDOMEN- Soft, nontender, no guarding or rebound, no palpable masses or organomegaly, bowel sounds present. BACK- tender to palpation over right sacroiliac joint, right gluteus maximus muscle and right proximal thigh. No erythema or rash. Normal sensation. Normal strength.  NEURO- AAOx3 EXTREMITIES- pulse 2+, symmetric, trace pedal edema SKIN- Warm, dry, No rash or lesion. PSYCH- Normal mood and affect, appropriate thought content and speech.   Medications: I have reviewed the patient's current medications. Scheduled Meds: . amLODipine  10 mg Oral Daily  . aspirin EC  81 mg Oral Daily  . atorvastatin  80 mg Oral QHS  . enoxaparin (LOVENOX) injection  40 mg Subcutaneous Q24H  . feeding supplement (GLUCERNA SHAKE)  237 mL Oral BID BM  . furosemide  40 mg Oral Daily  . gabapentin  600 mg Oral QHS  . insulin aspart  0-20 Units Subcutaneous TID WC  . insulin aspart  0-5 Units Subcutaneous QHS  . insulin aspart  8 Units Subcutaneous TID WC  . insulin glargine  27 Units Subcutaneous BID  . ipratropium-albuterol  3 mL Nebulization Q6H  .  lisinopril  40 mg Oral Daily  . metoprolol succinate  50 mg Oral Daily  . predniSONE  40 mg Oral Q breakfast  . sodium chloride  3 mL Intravenous Q12H  . tiotropium  18 mcg Inhalation Daily   Continuous Infusions:  PRN Meds:.sodium chloride, acetaminophen **OR** acetaminophen, benzonatate, ipratropium-albuterol, nitroGLYCERIN, sodium chloride Assessment/Plan:  COPD Exacerbation: Subjective dyspnea improving somewhat. Still having some decreased breath sounds at lung bases and diffuse wheezing but improved from yesterday. Continues to have non-productive cough. desatted to 87% while ambulating.  -duonebs q4hr -daily bmet/cbc -lasix 40 mg iv daily -prednisone 40 mg daily day 3/5  Chest Pain: Still having intermittent chest pain. Non-radiating. Troponin negative x3. EKG with NSR.   HYPERTENSION, BENIGN ESSENTIAL: BP well controlled in ED. On amlodipine 10 mg daily, lisionopril 40 mg daily and metoprolol succinate 50 mg daily at home. -Continue home meds  Type 2 diabetes mellitus with stage 2 chronic kidney disease: CBGs still difficult to control. Last A1c 11.6 in September.  -Lantus 27 units qam and qhs -increase novolog qac to 8 units -SSI-resistant with HS correction -CBGs qac and qhs -Neurontin 600 mg qhs  Strain of lumbar spine: Contnues to complain of right hip and leg pain. Lumbar spine XR with no evidence of fracture or subluxation.  -increase gabapentin to 600 mg qhs  Combined Systolic/Diastolic HF: Last ECHO Q000111Q with  EF 35-40% and grade 1 diastolic dysfunction. Mild LE edema. BNP normal. Takes Lasix 40 mg PO daily at home.  -Continue home dose lasix  FEN: Carb modified  DVT PPx: Lovenox  Dispo: Disposition is deferred at this time, awaiting improvement of current medical problems.  Anticipated discharge in approximately 1-2 day(s).   The patient does have a current PCP (Alexa Sherral Hammers, MD) and does need an [facility] hospital follow-up appointment after  discharge.  The patient does not have transportation limitations that hinder transportation to clinic appointments.  .Services Needed at time of discharge: Y = Yes, Blank = No PT:   OT:   RN:   Equipment:   Other:     LOS: 2 days   Maryellen Pile, MD IMTS PGY-1 [phone] 07/13/2015, 2:45 PM

## 2015-07-13 NOTE — Progress Notes (Signed)
  Date: 07/13/2015  Patient name: [name]  Medical record number: [id number]  Date of birth: [birth year]   This patient's plan of care was discussed with the house staff. Please see Dr. Shanna Cisco note for complete details. I concur with his findings.  When I saw her, the wheezing was improved, and cough seemed to be her main issue.  Consider giving cough syrup to help with this symptom.  Continue oral steroids.    Sid Falcon, MD 07/13/2015, 5:22 PM

## 2015-07-13 NOTE — Evaluation (Signed)
Occupational Therapy Evaluation Patient Details Name: [name] MRN: [id number] DOB: 1951/09/13 Today's Date: 07/13/2015    History of Present Illness [name] is a 64 year old female with a history of COPD, combined systolic and diastolic heart failure, obesity, HTN, insulin dependant DM type 2 presents to [facility] ED with 1 week history of worsening shortness of breath.   Clinical Impression   Patient presenting with deconditioning secondary to above. Patient independent to mod I PTA. Patient currently functioning at an overall supervision to min assist level. Patient will benefit from acute OT to increase overall independence in the areas of ADLs, functional mobility, and overall safety in order to safely discharge home with intermittent supervision/assistance.   On RA patient's sats ranged from 85%-93%. When lower than 90% encouraged pursed lip breathing and rest break. Sats quickly increased to above 90%. At end of session, donned 2L/min supplemental O2 via [place].     Follow Up Recommendations  No OT follow up;Supervision - Intermittent    Equipment Recommendations  None recommended by OT    Recommendations for Other Services  None at this time   Precautions / Restrictions Precautions Precautions: Fall Precaution Comments: monitor O2 Restrictions Weight Bearing Restrictions: No    Mobility Bed Mobility Overal bed mobility: Needs Assistance Bed Mobility: Supine to Sit     Supine to sit: Supervision;HOB elevated     General bed mobility comments: Increased time, supervision for safety  Transfers Overall transfer level: Needs assistance Equipment used: Rolling walker (2 wheeled) Transfers: Sit to/from Stand Sit to Stand: Supervision General transfer comment: supervision for safety. VC for  hand placement    Balance Overall balance assessment: Needs assistance Sitting-balance support: No upper extremity supported;Feet supported Sitting balance-Leahy Scale: Good      Standing balance support: Bilateral upper extremity supported;During functional activity Standing balance-Leahy Scale: Fair    ADL Overall ADL's : Needs assistance/impaired Eating/Feeding: Set up   Grooming: Set up;Sitting   Upper Body Bathing: Set up;Sitting   Lower Body Bathing: Min guard;Sit to/from stand   Upper Body Dressing : Set up;Sitting   Lower Body Dressing: Min guard;Sit to/from Archivist: Supervision/safety;RW;Regular Museum/gallery exhibitions officer and Hygiene: Supervision/safety;Sit to/from Nurse, children's Details (indicate cue type and reason): did not occur Functional mobility during ADLs: Min guard;Supervision/safety General ADL Comments: Pt overall supervision to min guard for functional mobility using RW. Pt able to reach BLEs, but bends over to do so and tends to get out of breath. Educated pt on how bending over may cause SOB. Pt will benefit from education on energy conservation for safety and independence.     Pertinent Vitals/Pain Pain Assessment: Faces Faces Pain Scale: Hurts a little bit Pain Location: RLE Pain Descriptors / Indicators: Guarding Pain Intervention(s): Monitored during session;Repositioned    Hand Dominance Right   Extremity/Trunk Assessment Upper Extremity Assessment Upper Extremity Assessment: Generalized weakness   Lower Extremity Assessment Lower Extremity Assessment: Generalized weakness   Cervical / Trunk Assessment Cervical / Trunk Assessment: Normal   Communication Communication Communication: No difficulties   Cognition Arousal/Alertness: Awake/alert Behavior During Therapy: WFL for tasks assessed/performed Overall Cognitive Status: Within Functional Limits for tasks assessed              Home Living Family/patient expects to be discharged to:: Private residence Living Arrangements: Alone Available Help at Discharge: Friend(s);Available PRN/intermittently;Family (pt  reports she can call daughter and daughter will come whenever needed) Type  of Home: House Home Access: Stairs to enter [employer] of Steps: 4 Entrance Stairs-Rails: Right;Left Home Layout: One level     Bathroom Shower/Tub: Teacher, early years/pre: Standard     Home Equipment: Tub bench;Walker - standard;Cane - single point    Prior Functioning/Environment Level of Independence: Independent with assistive device(s)  Comments: Uses cane and RW at times. States she can bath herself but gets very tired.    OT Diagnosis: Generalized weakness;Acute pain   OT Problem List: Decreased strength;Decreased activity tolerance;Impaired balance (sitting and/or standing);Decreased safety awareness;Decreased knowledge of use of DME or AE;Pain   OT Treatment/Interventions: Self-care/ADL training;Therapeutic exercise;Energy conservation;DME and/or AE instruction;Therapeutic activities;Patient/family education;Balance training    OT Goals(Current goals can be found in the care plan section) Acute Rehab OT Goals Patient Stated Goal: none stated OT Goal Formulation: With patient Time For Goal Achievement: 07/27/15 Potential to Achieve Goals: Good ADL Goals Pt Will Perform Grooming: with modified independence;standing Pt Will Transfer to Toilet: with modified independence;ambulating;regular height toilet Pt Will Perform Tub/Shower Transfer: Tub transfer;ambulating;with modified independence (DME/AD prn) Additional ADL Goal #1: Pt will be independent/mod I with functional mobility using RW prn Additional ADL Goal #2: Pt will be educated on energy conservation techniques to increase overall independence and safety with ADLs and functional mobility  OT Frequency: Min 2X/week   Barriers to D/C: Decreased caregiver support   End of Session Equipment Utilized During Treatment: Rolling walker;Oxygen Nurse Communication: Mobility status  Activity Tolerance: Patient tolerated  treatment well Patient left: in chair;with call bell/phone within reach   Time: 1153-1206 OT Time Calculation (min): 13 min Charges:  OT General Charges $OT Visit: 1 Procedure OT Evaluation $Initial OT Evaluation Tier I: 1 Procedure  [name] , MS, OTR/L, CLT Pager: [phone]  07/13/2015, 12:18 PM

## 2015-07-13 NOTE — Progress Notes (Signed)
Initial Nutrition Assessment  DOCUMENTATION CODES:   Morbid obesity  INTERVENTION:   Glucerna Shake po BID, each supplement provides 220 kcal and 10 grams of protein  NUTRITION DIAGNOSIS:   Increased nutrient needs related to chronic illness as evidenced by estimated needs  GOAL:   Patient will meet greater than or equal to 90% of their needs  MONITOR:   PO intake, Supplement acceptance, Labs, Weight trends, I & O's  REASON FOR ASSESSMENT:   Consult COPD Protocol  ASSESSMENT:   64 yo Female with a history of COPD, combined systolic and diastolic heart failure, obesity, HTN, insulin dependant DM type 2 presents to [facility] ED with 1 week history of worsening shortness of breath.  Patient reports she's eating pretty well.  PO intake variable at 30-100% per flowsheet records.  Nutrient needs increased given COPD.  Would benefit from addition of oral nutrition supplements.  Pt amenable.  RD to order.  Nutrition focused physical exam completed.  No muscle or subcutaneous fat depletion noticed.  Diet Order:  Diet heart healthy/carb modified Room service appropriate?: Yes; Fluid consistency:: Thin  Skin:  Reviewed, no issues  Last BM:  10/5  Height:   Ht Readings from Last 1 Encounters:  07/11/15 5' 0.25" (1.53 m)    Weight:   Wt Readings from Last 1 Encounters:  07/11/15 236 lb (107.049 kg)    Ideal Body Weight:  45.4 kg  BMI:  Body mass index is 45.73 kg/(m^2).  Estimated Nutritional Needs:   Kcal:  1700-1900  Protein:  90-100 gm  Fluid:  1.7-1.9 L  EDUCATION NEEDS:   No education needs identified at this time  Arthur Holms, RD, LDN Pager #: [phone] After-Hours Pager #: [phone]

## 2015-07-14 ENCOUNTER — Other Ambulatory Visit: Payer: Self-pay | Admitting: Internal Medicine

## 2015-07-14 LAB — BASIC METABOLIC PANEL
Anion gap: 8 (ref 5–15)
BUN: 26 mg/dL — ABNORMAL HIGH (ref 6–20)
CO2: 36 mmol/L — ABNORMAL HIGH (ref 22–32)
Calcium: 9.5 mg/dL (ref 8.9–10.3)
Chloride: 94 mmol/L — ABNORMAL LOW (ref 101–111)
Creatinine, Ser: 0.93 mg/dL (ref 0.44–1.00)
GFR calc Af Amer: >60 mL/min (ref >60)
GFR calc non Af Amer: >60 mL/min (ref >60)
Glucose, Bld: 265 mg/dL — ABNORMAL HIGH (ref 65–99)
Potassium: 3.9 mmol/L (ref 3.5–5.1)
Sodium: 138 mmol/L (ref 135–145)

## 2015-07-14 LAB — GLUCOSE, CAPILLARY
Glucose-Capillary: 200 mg/dL — ABNORMAL HIGH (ref 65–99)
Glucose-Capillary: 264 mg/dL — ABNORMAL HIGH (ref 65–99)
Glucose-Capillary: 275 mg/dL — ABNORMAL HIGH (ref 65–99)

## 2015-07-14 LAB — CBC
HCT: 38.9 % (ref 36.0–46.0)
Hemoglobin: 12.2 g/dL (ref 12.0–15.0)
MCH: 26.6 pg (ref 26.0–34.0)
MCHC: 31.4 g/dL (ref 30.0–36.0)
MCV: 84.7 fL (ref 78.0–100.0)
Platelets: 275 10*3/uL (ref 150–400)
RBC: 4.59 MIL/uL (ref 3.87–5.11)
RDW: 15.2 % (ref 11.5–15.5)
WBC: 12.9 10*3/uL — ABNORMAL HIGH (ref 4.0–10.5)

## 2015-07-14 MED ORDER — PREDNISONE 20 MG PO TABS: 40.0000 mg | ORAL_TABLET | Freq: Every day | ORAL | Status: DC

## 2015-07-14 MED ORDER — HYDROCOD POLST-CPM POLST ER 10-8 MG/5ML PO SUER: 5.0000 mL | Freq: Every evening | ORAL | Status: DC | PRN

## 2015-07-14 MED ORDER — GUAIFENESIN-DM 100-10 MG/5ML PO SYRP: 5.0000 mL | ORAL_SOLUTION | ORAL | Status: DC | PRN

## 2015-07-14 MED ORDER — TIOTROPIUM BROMIDE MONOHYDRATE 18 MCG IN CAPS: 18.0000 ug | ORAL_CAPSULE | Freq: Every day | RESPIRATORY_TRACT | Status: DC

## 2015-07-14 MED ORDER — IPRATROPIUM-ALBUTEROL 0.5-2.5 (3) MG/3ML IN SOLN: 3.0000 mL | RESPIRATORY_TRACT | Status: DC | PRN

## 2015-07-14 NOTE — Progress Notes (Signed)
  Date: 07/14/2015  Patient name: [name]  Medical record number: [id number]  Date of birth: 1951-03-11   This patient's plan of care was discussed with the house staff. Please see Dr. Shanna Cisco note for complete details. I concur with his findings.  [name] is improving daily.  Better control of cough with cough syrup today.  Could possibly be discharged home today.  Will likely need to cut back her insulin once off of steroids.  Will discuss with pharmacist in our clinic to help arrange and monitor.    Sid Falcon, MD 07/14/2015, 3:58 PM

## 2015-07-14 NOTE — Discharge Instructions (Signed)
FOR YOUR COUGH: -YOU CAN TAKE ROBITUSSIN DM EVERY 4 HOURS AS NEEDED -YOU CAN TAKE TUSSIONEX AT NIGHT (THIS ONE MAKES YOU FATIGUED)  FOR YOUR COPD/SHORTNESS OF BREATH: -USE YOUR ALBUTEROL INHALER AND NEBULIZERS -USE THE NEW SPIRIVA INHALER THAT I HAVE PRESCRIBED FOR YOU, WE MAY BE ABLE TO GET THIS FOR YOU FOR FREE IN OUR CLINIC AS WELL  FOR YOUR DIABETES: -CONTINUE TAKING YOUR LANTUS AND INSULIN, DR. Maudie Mercury WILL CONTACT YOU AS WELL TO HELP DOSE YOUR INSULIN

## 2015-07-14 NOTE — Progress Notes (Signed)
Pharmacist Provided - Patient Medication Education Prior to Discharge   [name] is an 64 y.o. female who presented to [facility] on 07/11/2015 with a chief complaint of  Chief Complaint  Patient presents with  . Asthma     [x]  Patient will be discharged with 4 new medications []  Patient being discharged without any new medications  The following medications were discussed with the patient: New Spiriva, albuterol inhaler, prednisone, albuterol nebs, robitussin, tussionex.   Pain Control medications: []  Yes    []  No  Diabetes Medications: [x]  Yes    []  No  Heart Failure Medications: []  Yes    []  No  Anticoagulation Medications:  []  Yes    []  No  Antibiotics at discharge: []  Yes    []  No  Allergy Assessment Completed and Updated: []  Yes    []  No Identified Patient Allergies:  Allergies  Allergen Reactions  . Actos [Pioglitazone]     REACTION: congestive heart failure ACTOS  . Avandia [Rosiglitazone Maleate]     unknown  . Hydrocortisone     unknown     Medication Adherence Assessment: []  Excellent (no doses missed/week)      [x]  Good (1 dose missed/week)      []  Partial (2-3 doses missed/week)      []  Poor (>3 doses missed/week)  Barriers to Obtaining Medications: []  Yes [x]  No   Assessment: Pt states respiratory therapy has explained some counseling points of using Spiriva.  Pt states that has only used MDI inhaler Albuterol before.  Counseled on proper inhalation technique with spiriva and that it is a long acting medication to be used daily not PRN.  Discussed albuterol neb and albuterol PRN inhaler technique with pt also.  Counseled on prednisone GI SE and hyperglycemia.  Pt was comfortable checking BG and injecting insulin.  Pt does not have any barriers to obtaining medications and demonstrated understanding.   Time spent preparing for discharge counseling: 10 min Time spent counseling patient: 15 min  Bennye Alm, PharmD Pharmacy Resident [phone]

## 2015-07-14 NOTE — Progress Notes (Signed)
Subjective: Patient seen and examined this morning. Still complaining of non-productive cough. She reports her shortness of breath is improving.   Objective: Vital signs in last 24 hours: Filed Vitals:   07/13/15 1438 07/13/15 2009 07/13/15 2204 07/14/15 0601  BP:   142/55 112/60  Pulse:  92 95 92  Temp:   98.6 F (37 C) 97.8 F (36.6 C)  TempSrc:   Oral Oral  Resp:  18 18 18   Height:      Weight:      SpO2: 98%  100% 100%   Weight change:   Intake/Output Summary (Last 24 hours) at 07/14/15 [id number] Last data filed at 07/13/15 2309  Gross per 24 hour  Intake   1200 ml  Output    500 ml  Net    700 ml    Physical Exam: GENERAL- obese, alert, co-operative, appears as stated age, in no distress. HEENT- Atraumatic, normocephalic CARDIAC- RRR, no murmurs, rubs or gallops. RESP- scattered expiratory wheezing. No rhonchi, no crackles ABDOMEN- Soft, nontender, no guarding or rebound, no palpable masses or organomegaly, bowel sounds present. NEURO- AAOx3 EXTREMITIES- pulse 2+, symmetric, trace pedal edema SKIN- Warm, dry, No rash or lesion. PSYCH- Normal mood and affect, appropriate thought content and speech.   Medications: I have reviewed the patient's current medications. Scheduled Meds: . amLODipine  10 mg Oral Daily  . aspirin EC  81 mg Oral Daily  . atorvastatin  80 mg Oral QHS  . chlorpheniramine-HYDROcodone  5 mL Oral Q12H  . enoxaparin (LOVENOX) injection  40 mg Subcutaneous Q24H  . feeding supplement (GLUCERNA SHAKE)  237 mL Oral BID BM  . furosemide  40 mg Oral Daily  . gabapentin  600 mg Oral QHS  . insulin aspart  0-20 Units Subcutaneous TID WC  . insulin aspart  0-5 Units Subcutaneous QHS  . insulin aspart  12 Units Subcutaneous TID WC  . insulin glargine  37 Units Subcutaneous BID  . lisinopril  40 mg Oral Daily  . metoprolol succinate  50 mg Oral Daily  . predniSONE  40 mg Oral Q breakfast  . sodium chloride  3 mL Intravenous Q12H  . tiotropium  18 mcg  Inhalation Daily   Continuous Infusions:  PRN Meds:.sodium chloride, acetaminophen **OR** acetaminophen, benzonatate, ipratropium-albuterol, nitroGLYCERIN, sodium chloride Assessment/Plan:  COPD Exacerbation: Subjective dyspnea improving somewhat. Having scattered expiratory wheezing, much improved from previous. Still on 2L [place]. Will ambulate and check pulse ox again today. Continues to have non-productive cough.  -duonebs q4hr prn -Spiriva 18 mcg daily -daily bmet/cbc -prednisone 40 mg daily day 4/5 -tessalon prn -tussionex 42mL q12hr  Chest Pain: Troponin negative x3. EKG with NSR.   HYPERTENSION, BENIGN ESSENTIAL: BP soft this morning. On amlodipine 10 mg daily, lisionopril 40 mg daily and metoprolol succinate 50 mg daily at home. -Continue home meds  Type 2 diabetes mellitus with stage 2 chronic kidney disease: CBGs still difficult to control. CBG 200 this morning. Last A1c 11.6 in September. She is on steroids right now and will likely not need as high of insulin doses at discharge when she stops the steroids.  -Lantus to 37 units qam and qhs -novolog qac to 12 units -SSI-resistant with HS correction -CBGs qac and qhs -Neurontin 600 mg qhs  Strain of lumbar spine: Contnues to complain of right hip and leg pain. Lumbar spine XR with no evidence of fracture or subluxation.  -continue gabapentin to 600 mg qhs  Combined Systolic/Diastolic HF: Last ECHO [id number] with  EF 35-40% and grade 1 diastolic dysfunction. Mild LE edema. BNP normal. Takes Lasix 40 mg PO daily at home.  -Continue home dose lasix  FEN: Carb modified  DVT PPx: Lovenox  Dispo: Likely discharge this afternoon  The patient does have a current PCP (Alexa Sherral Hammers, MD) and does need an [facility] hospital follow-up appointment after discharge.  The patient does not have transportation limitations that hinder transportation to clinic appointments.  .Services Needed at time of discharge: Y = Yes, Blank = No PT:   OT:    RN:   Equipment:   Other:     LOS: 3 days   Maryellen Pile, MD IMTS PGY-1 [phone] 07/14/2015, 6:09 AM

## 2015-07-14 NOTE — Progress Notes (Signed)
Physical Therapy Treatment Patient Details Name: [name] MRN: [id number] DOB: 10-20-1950 Today's Date: 07/14/2015    History of Present Illness [name] is a 64 year old female with a history of COPD, combined systolic and diastolic heart failure, obesity, HTN, insulin dependant DM type 2 presents to [facility] ED with 1 week history of worsening shortness of breath.    PT Comments    Patient progressing well towards PT goals. Tolerated stair training with Min guard assist for safety. Continues to require standing and seated rest breaks due to fatigue and dypsnea. Sp02 ranged from 84-90% on RA throughout activity. Education re: energy conservation techniques and pursed lip breathing. Will continue to follow acutely per current POC.   Follow Up Recommendations  Home health PT;Supervision - Intermittent     Equipment Recommendations  Other (comment) (rollator)    Recommendations for Other Services       Precautions / Restrictions Precautions Precautions: Fall Precaution Comments: monitor O2 Restrictions Weight Bearing Restrictions: No    Mobility  Bed Mobility               General bed mobility comments: Sitting in chair upon PT arrival.   Transfers Overall transfer level: Needs assistance Equipment used: Rolling walker (2 wheeled) Transfers: Sit to/from Stand Sit to Stand: Min guard         General transfer comment: Min guard for safety.   Ambulation/Gait Ambulation/Gait assistance: Supervision Ambulation Distance (Feet): 100 Feet (+ 130') Assistive device: Rolling walker (2 wheeled) Gait Pattern/deviations: Step-through pattern;Decreased stride length;Trunk flexed Gait velocity: decreased   General Gait Details: Slow but steady. Required 4 standing rest breaks and 1 seated rest break due to dyspnea/fatigue. Sp02 ranged from 84-90% on RA. Cues for energy conservation and pursed lip breathing.   Stairs Stairs: Yes Stairs assistance: Min guard Stair  Management: Step to pattern;Two rails Number of Stairs: 4 General stair comments: Cues for safety and technique. Dyspnea present.  Wheelchair Mobility    Modified Rankin (Stroke Patients Only)       Balance Overall balance assessment: Needs assistance Sitting-balance support: Feet supported;No upper extremity supported Sitting balance-Leahy Scale: Good     Standing balance support: During functional activity Standing balance-Leahy Scale: Fair                      Cognition Arousal/Alertness: Awake/alert Behavior During Therapy: WFL for tasks assessed/performed Overall Cognitive Status: Within Functional Limits for tasks assessed                      Exercises      General Comments        Pertinent Vitals/Pain Faces Pain Scale: Hurts a little bit Pain Location: BLEs Pain Descriptors / Indicators: Aching Pain Intervention(s): Monitored during session;Repositioned    Home Living                      Prior Function            PT Goals (current goals can now be found in the care plan section) Progress towards PT goals: Progressing toward goals    Frequency  Min 3X/week    PT Plan Current plan remains appropriate    Co-evaluation             End of Session Equipment Utilized During Treatment: Gait belt Activity Tolerance: Patient tolerated treatment well Patient left: in chair;with call bell/phone within reach     Time:  [id number] PT Time Calculation (min) (ACUTE ONLY): 23 min  Charges:  $Gait Training: 8-22 mins $Therapeutic Exercise: 8-22 mins                    G Codes:      [place] 07/14/2015, 1:22 PM Wray Kearns, [place], DPT [phone]

## 2015-07-14 NOTE — Care Management Important Message (Signed)
Important Message  Patient Details  Name: [name] MRN: [id number] Date of Birth: 05-Apr-1951   Medicare Important Message Given:  Yes-second notification given    Nathen May 07/14/2015, 10:57 AM

## 2015-07-14 NOTE — Progress Notes (Signed)
Pt given discharge instructions, prescriptions, and care notes. Pt verbalized understanding AEB no further questions or concerns at this time. IV was discontinued, no redness, pain, or swelling noted at this time. Telemetry discontinued and Centralized Telemetry was notified. Pt had prescription for Tussinex, which had not been signed. Oncall MD notified and asked to come sign. Oncall MD said they could not come to sign. Pt did not want to wait, she was ready to go. Pt left the floor via wheelchair with staff in stable condition.

## 2015-07-14 NOTE — Progress Notes (Signed)
Occupational Therapy Treatment Patient Details Name: [name] MRN: [id number] DOB: 11/27/1950 Today's Date: 07/14/2015    History of present illness [name] is a 64 year old female with a history of COPD, combined systolic and diastolic heart failure, obesity, HTN, insulin dependant DM type 2 presents to [facility] ED with 1 week history of worsening shortness of breath.   OT comments  Pt tolerated session well. OT provided paper handout for energy conservation of general principles as well as in regards to self care. Ot provided function examples of times when these techniques needing to be utilized with pt able to follow sheet and verbalize techniques for possible use with situation. Energy conservation to continue.   Follow Up Recommendations  No OT follow up;Supervision - Intermittent    Equipment Recommendations  None recommended by OT    Recommendations for Other Services      Precautions / Restrictions Precautions Precautions: Fall Precaution Comments: monitor O2 Restrictions Weight Bearing Restrictions: No       Mobility Bed Mobility Overal bed mobility: Needs Assistance Bed Mobility: Supine to Sit     Supine to sit: Supervision;HOB elevated Sit to supine: Supervision   General bed mobility comments: supervision for safety  Transfers Overall transfer level: Needs assistance Equipment used: Rolling walker (2 wheeled) Transfers: Sit to/from Stand Sit to Stand: Min guard         General transfer comment: min verbal cues for hand placement    Balance Overall balance assessment: Needs assistance Sitting-balance support: No upper extremity supported;Feet supported Sitting balance-Leahy Scale: Good     Standing balance support: During functional activity;Single extremity supported Standing balance-Leahy Scale: Fair                     ADL Overall ADL's : Needs assistance/impaired     Grooming: Wash/dry face;Min Radio producer: RW;BSC;Min guard   Toileting- Water quality scientist and Hygiene: Min guard;Sit to/from stand       Functional mobility during ADLs: Min guard;Supervision/safety General ADL Comments: Pt overall min guard for functional transfers with RW. Pt ambulated with RW to stand at sink side to wash face with steady assist for standing balance. Pt then ambulating ~20' towards recliner chair and stopping at [facility] to void with min guard  for balance during clothing management and hygiene. Pt  returning to recliner chair with tray table placed in front of pt awaiting breakfast.                 Cognition   Behavior During Therapy: [facility] for tasks assessed/performed Overall Cognitive Status: Within Functional Limits for tasks assessed                                    Pertinent Vitals/ Pain       Faces Pain Scale: Hurts a little bit Pain Location: R LE Pain Descriptors / Indicators: Aching;Sore Pain Intervention(s): Monitored during session;Repositioned         Frequency Min 2X/week     Progress Toward Goals  OT Goals(current goals can now be found in the care plan section)  Progress towards OT goals: Progressing toward goals     Plan Discharge plan remains appropriate       End of Session Equipment Utilized During Treatment: Rolling walker;Oxygen  Activity Tolerance Patient tolerated treatment well   Patient Left in chair;with call bell/phone within reach           Time: 0738-0801 OT Time Calculation (min): 23 min  Charges: OT Treatments $Self Care/Home Management : 23-37 mins  Phineas Semen, MS, OTR/L 07/14/2015, 9:23 AM

## 2015-07-14 NOTE — Progress Notes (Signed)
SATURATION QUALIFICATIONS: (This note is used to comply with regulatory documentation for home oxygen)  Patient Saturations on Room Air at Rest = 91%  Patient Saturations on Room Air while Ambulating = 88%  Patient Saturations on 2L Liters of oxygen while Ambulating = 94%  Please briefly explain why patient needs home oxygen:

## 2015-07-15 ENCOUNTER — Other Ambulatory Visit: Payer: Self-pay | Admitting: Pharmacist

## 2015-07-15 ENCOUNTER — Telehealth: Payer: Self-pay | Admitting: Internal Medicine

## 2015-07-15 ENCOUNTER — Telehealth: Payer: Self-pay | Admitting: Pharmacist

## 2015-07-15 ENCOUNTER — Ambulatory Visit: Payer: Medicare Other | Admitting: Pharmacist

## 2015-07-15 ENCOUNTER — Other Ambulatory Visit: Payer: Self-pay | Admitting: Internal Medicine

## 2015-07-15 ENCOUNTER — Encounter: Payer: Medicare Other | Admitting: Internal Medicine

## 2015-07-15 DIAGNOSIS — I129 Hypertensive chronic kidney disease with stage 1 through stage 4 chronic kidney disease, or unspecified chronic kidney disease: Secondary | ICD-10-CM | POA: Diagnosis not present

## 2015-07-15 DIAGNOSIS — Z9981 Dependence on supplemental oxygen: Secondary | ICD-10-CM | POA: Diagnosis not present

## 2015-07-15 DIAGNOSIS — I5040 Unspecified combined systolic (congestive) and diastolic (congestive) heart failure: Secondary | ICD-10-CM | POA: Diagnosis not present

## 2015-07-15 DIAGNOSIS — E1122 Type 2 diabetes mellitus with diabetic chronic kidney disease: Secondary | ICD-10-CM | POA: Diagnosis not present

## 2015-07-15 DIAGNOSIS — N182 Chronic kidney disease, stage 2 (mild): Secondary | ICD-10-CM | POA: Diagnosis not present

## 2015-07-15 DIAGNOSIS — Z794 Long term (current) use of insulin: Secondary | ICD-10-CM | POA: Diagnosis not present

## 2015-07-15 DIAGNOSIS — J441 Chronic obstructive pulmonary disease with (acute) exacerbation: Secondary | ICD-10-CM | POA: Diagnosis not present

## 2015-07-15 DIAGNOSIS — G4730 Sleep apnea, unspecified: Secondary | ICD-10-CM | POA: Diagnosis not present

## 2015-07-15 MED ORDER — TIOTROPIUM BROMIDE MONOHYDRATE 18 MCG IN CAPS: 18.0000 ug | ORAL_CAPSULE | Freq: Every morning | RESPIRATORY_TRACT | Status: DC

## 2015-07-15 MED ORDER — TIOTROPIUM BROMIDE MONOHYDRATE 18 MCG IN CAPS: 18.0000 ug | ORAL_CAPSULE | Freq: Every day | RESPIRATORY_TRACT | Status: DC

## 2015-07-15 MED ORDER — GUAIFENESIN-CODEINE 100-10 MG/5ML PO SOLN: 10.0000 mL | Freq: Every evening | ORAL | Status: DC | PRN

## 2015-07-15 NOTE — Telephone Encounter (Signed)
Script not signed, dr Marvel Plan changed to guif/cod syrup, called to [facility], pt aware

## 2015-07-15 NOTE — Telephone Encounter (Signed)
Pt called requesting the nurse to call back regarding cough medicine. Please call back.

## 2015-07-15 NOTE — Progress Notes (Signed)
Spiriva hospital-to-home program (Rx sent for free inhaler)

## 2015-07-15 NOTE — Addendum Note (Signed)
Addended by: Forde Dandy on: 07/15/2015 11:26 AM   Modules accepted: Orders, Medications

## 2015-07-15 NOTE — Telephone Encounter (Signed)
Transitions of Care Follow-Up Called pt to confirm that was able to pick up Sp[facility] from [facility]. Pt states that did pick up Spiriva and Prednisone (1 tx day remaining).  Pt states that FBG 07/15/15 was 115 mg/dL. Pt states that MD did not sign Tussionex rx.  Contacted Dr Marvel Plan and switched Cough Syrup to Codeine/Guaifenesin for call [facility]. Enrolled pt in [facility] Home Program for 1 month supply of Spiriva free.  Will f/u with patient tomorrow to continue to monitor BG as her steroids are stopped tomorrow.

## 2015-07-16 NOTE — Telephone Encounter (Signed)
Transitions of Care FollowUp Called pt to confirm that she received Spiriva from pharmacy.  Pt stated that has both Spiriva from [facility] and the [facility] to [facility].  Pt demonstrates understanding of inhaler use and frequency.  Pt states FBG today was 97 (pt denies s/sx hypoglycemia).  Pt has 1 day of PO steroids since was unable to obtain prednisone from pharmacy until late last night.

## 2015-07-17 ENCOUNTER — Other Ambulatory Visit: Payer: Self-pay | Admitting: *Deleted

## 2015-07-17 ENCOUNTER — Telehealth: Payer: Self-pay | Admitting: Pharmacist

## 2015-07-17 DIAGNOSIS — I5040 Unspecified combined systolic (congestive) and diastolic (congestive) heart failure: Secondary | ICD-10-CM | POA: Diagnosis not present

## 2015-07-17 DIAGNOSIS — J441 Chronic obstructive pulmonary disease with (acute) exacerbation: Secondary | ICD-10-CM | POA: Diagnosis not present

## 2015-07-17 DIAGNOSIS — N182 Chronic kidney disease, stage 2 (mild): Secondary | ICD-10-CM | POA: Diagnosis not present

## 2015-07-17 DIAGNOSIS — I129 Hypertensive chronic kidney disease with stage 1 through stage 4 chronic kidney disease, or unspecified chronic kidney disease: Secondary | ICD-10-CM | POA: Diagnosis not present

## 2015-07-17 DIAGNOSIS — Z794 Long term (current) use of insulin: Secondary | ICD-10-CM | POA: Diagnosis not present

## 2015-07-17 DIAGNOSIS — G4730 Sleep apnea, unspecified: Secondary | ICD-10-CM | POA: Diagnosis not present

## 2015-07-17 DIAGNOSIS — Z9981 Dependence on supplemental oxygen: Secondary | ICD-10-CM | POA: Diagnosis not present

## 2015-07-17 DIAGNOSIS — E1122 Type 2 diabetes mellitus with diabetic chronic kidney disease: Secondary | ICD-10-CM | POA: Diagnosis not present

## 2015-07-17 MED ORDER — GABAPENTIN 300 MG PO CAPS: 300.0000 mg | ORAL_CAPSULE | Freq: Every day | ORAL | Status: DC

## 2015-07-17 NOTE — Telephone Encounter (Signed)
Steroid-Induced Hyperglycemia Prevention and Management [name] is a 64 y.o. female who meets criteria for [facility] quality improvement program (diabetes patient prescribed short courses of oral steroids).  A/P Current Regimen  Patient prescribed prednisone 40 mg daily x 1 days, took last dose yesterday  Current DM regimen although records state insulin glargine 27 units daily, patient is taking 27 units BID. Of note, inpatient dose had been titrated up to 37 units BID, however patient did not receive a full day with this regimen prior to discharge so we were unable to assess her response to this dose and therefore discharged her on home dose of 27 units BID.  Home BG Monitoring  Patient does check BG at home   CBGs at home 119 pre-dinner 07/16/15, 400s this morning pre-breakfast  A1C prior to steroid course 11.9  Signs/symptoms of hyper- or hypoglycemia: none, none  Medication Management  Increased insulin glargine to 30 units BID  Patient Education  Advised patient to monitor FBG while on steroid therapy (at least twice daily prior to first 2 meals of the day).  Patient educated about signs/symptoms and advised to contact clinic if hyper- or hypoglycemic or if FBG levels remain elevated >130.  Patient did  verbalize understanding of information and regimen by repeating back topics discussed.  Follow-up Call back Monday 07/20/15   Patient has an upcoming PCP and clinical pharmacist appointment on 07/22/15.  Kim,Jennifer J 11:58 AM 07/17/2015

## 2015-07-17 NOTE — Telephone Encounter (Signed)
I agree with the assessment and plan of care documented by Dr. Zara Council.

## 2015-07-20 ENCOUNTER — Telehealth: Payer: Self-pay | Admitting: Internal Medicine

## 2015-07-20 NOTE — Telephone Encounter (Addendum)
CDE called  to follow up on blood sugars since her insulin increase last Friday. Her fasting today was 279.  She says it was in the 100s this weekend. She thinks it was high this am beause she ate cake last night. She checked her blood sugar while we were on the phone: results was 189. She plans to take 5 units of Humalog and 30 units of lantus at   [facility].    We agreed to talk again tomorrow.

## 2015-07-20 NOTE — Telephone Encounter (Signed)
Juliann Pulse from [facility] called requesting verbal order skilled nursing for her medication and diabetic teaching.

## 2015-07-21 NOTE — Telephone Encounter (Signed)
Called patient to follow up on blood sugars: she said it was "kinda high last night because she ate ice cream for dinner and then checked at 10 Pm before she took her Humalog and Lantus at bedtime. Her CBGs was 125 this am.  CDE educated patient on action of insulins both Humalog and Lantus and that she can take her insulins together if it works out that she is eating  A meal at the time she is supposed to take them such as breakfast, but in the evening the Humalog should not be taken at bedtime, but before her evening meal and the Lantus can be taken at either her evening meal or bedtime.  Patient verbalized understanding, however, since she will be here tomorrow would have her teachback the times she will take each insulin.

## 2015-07-22 ENCOUNTER — Ambulatory Visit: Payer: Medicare Other | Admitting: Pharmacist

## 2015-07-22 ENCOUNTER — Ambulatory Visit (INDEPENDENT_AMBULATORY_CARE_PROVIDER_SITE_OTHER): Payer: Medicare Other | Admitting: Internal Medicine

## 2015-07-22 ENCOUNTER — Encounter: Payer: Self-pay | Admitting: Internal Medicine

## 2015-07-22 VITALS — BP 125/75 | HR 98 | Temp 98.0°F | Wt 238.7 lb

## 2015-07-22 DIAGNOSIS — Z794 Long term (current) use of insulin: Secondary | ICD-10-CM

## 2015-07-22 DIAGNOSIS — E1122 Type 2 diabetes mellitus with diabetic chronic kidney disease: Secondary | ICD-10-CM | POA: Diagnosis not present

## 2015-07-22 DIAGNOSIS — M5416 Radiculopathy, lumbar region: Secondary | ICD-10-CM

## 2015-07-22 DIAGNOSIS — J441 Chronic obstructive pulmonary disease with (acute) exacerbation: Secondary | ICD-10-CM | POA: Diagnosis not present

## 2015-07-22 DIAGNOSIS — Z9981 Dependence on supplemental oxygen: Secondary | ICD-10-CM | POA: Diagnosis not present

## 2015-07-22 DIAGNOSIS — I5040 Unspecified combined systolic (congestive) and diastolic (congestive) heart failure: Secondary | ICD-10-CM | POA: Diagnosis not present

## 2015-07-22 DIAGNOSIS — I10 Essential (primary) hypertension: Secondary | ICD-10-CM

## 2015-07-22 DIAGNOSIS — I129 Hypertensive chronic kidney disease with stage 1 through stage 4 chronic kidney disease, or unspecified chronic kidney disease: Secondary | ICD-10-CM

## 2015-07-22 DIAGNOSIS — N182 Chronic kidney disease, stage 2 (mild): Secondary | ICD-10-CM | POA: Diagnosis not present

## 2015-07-22 DIAGNOSIS — E1165 Type 2 diabetes mellitus with hyperglycemia: Secondary | ICD-10-CM

## 2015-07-22 DIAGNOSIS — J449 Chronic obstructive pulmonary disease, unspecified: Secondary | ICD-10-CM

## 2015-07-22 DIAGNOSIS — F17210 Nicotine dependence, cigarettes, uncomplicated: Secondary | ICD-10-CM

## 2015-07-22 DIAGNOSIS — Z7951 Long term (current) use of inhaled steroids: Secondary | ICD-10-CM

## 2015-07-22 DIAGNOSIS — G4730 Sleep apnea, unspecified: Secondary | ICD-10-CM | POA: Diagnosis not present

## 2015-07-22 LAB — GLUCOSE, CAPILLARY: Glucose-Capillary: 336 mg/dL — ABNORMAL HIGH (ref 65–99)

## 2015-07-22 MED ORDER — CYCLOBENZAPRINE HCL 7.5 MG PO TABS: 7.5000 mg | ORAL_TABLET | Freq: Three times a day (TID) | ORAL | Status: DC | PRN

## 2015-07-22 MED ORDER — GABAPENTIN 300 MG PO CAPS: 300.0000 mg | ORAL_CAPSULE | Freq: Three times a day (TID) | ORAL | Status: DC

## 2015-07-22 NOTE — Telephone Encounter (Signed)
Have called kathy back, no answer, lm for rtc

## 2015-07-22 NOTE — Assessment & Plan Note (Signed)
BP Readings from Last 3 Encounters:  07/22/15 125/75  07/14/15 129/53  06/24/15 124/67    Lab Results  Component Value Date   NA 138 07/14/2015   K 3.9 07/14/2015   CREATININE 0.93 07/14/2015    Assessment: Blood pressure control:  Controlled on amlodipine 10 mg daily, lisinopril 40 mg daily, and metoprolol 50 mg daily.  Progress toward BP goal:   At goal Comments: Compliant  Plan: Medications:  continue current medications

## 2015-07-22 NOTE — Telephone Encounter (Signed)
[name] called back, verbal given, are you ok with this?

## 2015-07-22 NOTE — Progress Notes (Signed)
Subjective:    Patient ID: [name], female    DOB: 04-23-1951, 64 y.o.   MRN: [id number]  HPI [name] is a 64 y.o. female with PMHx of hyperthyroidism, COPD, and NICM who presents to the clinic for hospital follow up for her COPD exacerbation. Please see A&P for the status of the patient's chronic medical problems.   COPD: Patient has moderate to severe emphysema according to her recent PFTs on 07/03/15 which showed FEV1 .93; however, a FEV1/FVC ratio of 102. The FEV1/FVC ratio is likely falsely elevated as the FVC was much lower than it likely truly is given patient's length of expiration. RV high at 323. Patient has had multiple exacerbations in that last year and has only been on oxygen and albuterol. Patient has done well after discharge and has been complaint with Spiriva, she finished her prednisone taper and has not been having to use her albuterol or use her oxygen more often.   T2DM: CBGs have been labile given recent use of prednisone. [facility] pharmacy has been titrating her insulin over the phone according to her CBGs. Patient is currently on Lantus 30 units BID and Humalog 5 units before breakfast and before dinner. CBGs have ranged from 69 to 480.   Sciatica: Patient complains of a 2 month history of right sided low back pain that radiates around her right hip with radiation of pain anteriorly down her RLE to her knee. Pain is achey, but can be numb in her leg. It occurs everyday, worse with standing, gabapentin makes it a little better. Pain is a 7-8/10 at its worst. Naproxen did not help. Lumber spine xrays showed no signs of dislocation or fracture, facets were within normal limits and SI joints also were normal. Patient has been working with PT and completed their exercises.   HTN: Compliant with medications. No lightheadedness or dizziness.   Past Medical History  Diagnosis Date  . Hyperthyroidism, subclinical   . Post menopausal syndrome   . Obesity   . Cardiac  defibrillator in situ     Atlas II VR (SJM) implanted by Dr Lovena Le  . Eczema   . Lipoma   . Chronic ulcer of leg ([place])     04-09-15 resolved-not a problem.  . Hyperlipidemia   . Chronic combined systolic and diastolic heart failure (HCC)     a. EF 35-40% in past;  b. Echo 7/13:  EF 45-50%, Gr 2 diast dysfn, mild AI, mild MAC, trivial MR, mild LAE, PASP 47.  [name] NICM (nonischemic cardiomyopathy) ([place])   . Diabetes mellitus   . HTN (hypertension)   . Elevated alkaline phosphatase level     GGT and 5'nucleotidase 8/13 normal  . Hx of cardiac cath     a. Standard 2003 normal;  b. LHC 6/13:  Mild calcification in the LM, o/w normal coronary arteries, EF 45%.   . Sleep apnea     pt denies 04/12/2013  . COPD (chronic obstructive pulmonary disease) (HCC)     O2 at night  . Automatic implantable cardioverter-defibrillator in situ   . CHF (congestive heart failure) ([place])   . Depression   . Implantable cardioverter-defibrillator (ICD) generator end of life   . Colon polyps 04/12/2013    Rectosigmoid polyp  . History of oxygen administration     oxygen @ 2 l/m nasally bedtime 24/7  . ATN (acute tubular necrosis) ([place]) 07/15/2014    Outpatient Encounter Prescriptions as of 07/22/2015  Medication Sig  .  albuterol (PROVENTIL) (2.5 MG/3ML) 0.083% nebulizer solution USE 1 VIAL VIA NEBULIZER EVERY 6 HOURS AS NEEDED FOR WHEEZING OR SHORTNESS OF BREATH  . albuterol (VENTOLIN HFA) 108 (90 BASE) MCG/ACT inhaler Inhale 2 puffs into the lungs every 6 (six) hours as needed. Shortness of breath  . amLODipine (NORVASC) 10 MG tablet TAKE 1 TABLET BY MOUTH DAILY  . aspirin EC 81 MG tablet Take 81 mg by mouth daily.  [name] atorvastatin (LIPITOR) 80 MG tablet Take 1 tablet (80 mg total) by mouth at bedtime.  . furosemide (LASIX) 20 MG tablet Take 2 tablets (40 mg total) by mouth daily.  [name] gabapentin (NEURONTIN) 300 MG capsule Take 1 capsule (300 mg total) by mouth at bedtime.  [name] glucose blood (ONE TOUCH ULTRA TEST) test  strip Use as instructed by doctor up to 3x daily,dx code 250.02 insulin requiring  . guaiFENesin-codeine 100-10 MG/5ML syrup Take 10 mLs by mouth at bedtime as needed for cough.  [name] guaiFENesin-dextromethorphan (ROBITUSSIN DM) 100-10 MG/5ML syrup Take 5 mLs by mouth every 4 (four) hours as needed for cough.  . insulin glargine (LANTUS) 100 UNIT/ML injection Inject 27 Units into the skin daily.  . insulin lispro (HUMALOG KWIKPEN) 100 UNIT/ML KiwkPen Inject 5 units with breakfast and 5 units with dinner, hold if CBGs low or not eating and call MD  . lisinopril (PRINIVIL,ZESTRIL) 40 MG tablet Take 1 tablet (40 mg total) by mouth daily.  . metoprolol succinate (TOPROL-XL) 50 MG 24 hr tablet Take 1 tablet (50 mg total) by mouth daily.  . naproxen (NAPROSYN) 500 MG tablet Take 1 tablet (500 mg total) by mouth 2 (two) times daily with a meal.  . nitroGLYCERIN (NITROSTAT) 0.4 MG SL tablet Place 0.4 mg under the tongue every 5 (five) minutes as needed for chest pain.  [name] omeprazole (PRILOSEC) 20 MG capsule Take one tablet daily 30 minutes before breakfast (Patient not taking: Reported on 06/18/2015)  . predniSONE (DELTASONE) 20 MG tablet Take 2 tablets (40 mg total) by mouth daily with breakfast.  . tiotropium (SPIRIVA HANDIHALER) 18 MCG inhalation capsule Place 1 capsule (18 mcg total) into inhaler and inhale every morning.  . tiotropium (SPIRIVA) 18 MCG inhalation capsule Place 1 capsule (18 mcg total) into inhaler and inhale daily.   No facility-administered encounter medications on file as of 07/22/2015.    Family History  Problem Relation Age of Onset  . Stroke Mother   . Seizures Father   . Diabetes Sister   . Asthma Maternal Aunt     aunts  . Asthma Maternal Uncle     uncles  . Heart disease Father   . Heart disease Paternal Aunt     aunts  . Heart disease Paternal Uncle     uncles  . Heart disease Maternal Aunt     aunts  . Heart disease Maternal Uncle     uncles  . Heart disease  Maternal Grandfather   . Colon cancer Neg Hx   . Colon polyps Neg Hx   . Esophageal cancer Neg Hx   . Kidney disease Neg Hx   . Gallbladder disease Neg Hx     Social History   Social History  . Marital Status: Widowed    Spouse Name: Alroy Dust  . Number of Children: 3  . Years of Education: 11   Occupational History  . disabled    Social History Main Topics  . Smoking status: Heavy Tobacco Smoker -- 2.00 packs/day for 35 years  Types: Cigarettes  . Smokeless tobacco: Never Used     Comment: Trying to quit.  . Alcohol Use: No  . Drug Use: No  . Sexual Activity: Not on file   Other Topics Concern  . Not on file   Social History Narrative   ** Merged History Encounter **       Married    Review of Systems General: Denies fever, chills, fatigue, change in appetite  Respiratory: Denies SOB, cough, chest tightness, and wheezing.   Cardiovascular: Denies chest pain and palpitations.  Gastrointestinal: Denies abdominal pain, diarrhea, constipation Musculoskeletal: Admits to right low back pain and hip pain Skin: Denies pallor, rash and wounds.  Neurological: Denies bladder or bowel incontinence, dizziness, headaches, weakness, lightheadedness.     Objective:   Physical Exam Filed Vitals:   07/22/15 1328  BP: 125/75  Pulse: 98  Temp: 98 F (36.7 C)  TempSrc: Oral  Weight: 238 lb 11.2 oz (108.274 kg)  SpO2: 92%   General: Vital signs reviewed.  Patient is obese, in no acute distress and cooperative with exam.  Cardiovascular: Tachycardic, regular rhythm, S1 normal, S2 normal Pulmonary/Chest: Clear to auscultation bilaterally, no wheezes, rales, or rhonchi. Abdominal: Soft, non-tender, non-distended, BS + Musculoskeletal: Positive tenderness on palpation of lumbar spine and Right SI joints. Gluteus muscles on right also tender on palpation. No joint deformities, erythema, or stiffness Extremities: No lower extremity edema bilaterally, pulses symmetric and intact  bilaterally. Neurological: Strength is normal and symmetric bilaterally, sensory intact to light touch bilaterally.  Skin: Warm, dry and intact. No rashes or erythema. Psychiatric: Normal mood and affect. speech and behavior is normal. Cognition and memory are normal.      Assessment & Plan:   Please see problem based assessment and plan.

## 2015-07-22 NOTE — Assessment & Plan Note (Signed)
Patient complains of a 2 month history of right sided low back pain that radiates around her right hip with radiation of pain anteriorly down her RLE to her knee. Pain is achey, but can be numb in her leg. It occurs everyday, worse with standing, gabapentin makes it a little better. Pain is a 7-8/10 at its worst. Naproxen did not help. Lumber spine xrays showed no signs of dislocation or fracture, facets were within normal limits and SI joints also were normal. Patient has been working with PT and completed their exercises.   Plan: -PT -Gabapentin 300 mg TID  -Flexeril 7.5 mg TID prn -Discussed weight loss

## 2015-07-22 NOTE — Patient Instructions (Signed)
For your back pain, take gabapentin three times a day and flexeril three times a day as needed for pain. Continue to work with PT and work on weight loss.  Continue taking all of your other medications the same.

## 2015-07-22 NOTE — Assessment & Plan Note (Signed)
Patient has moderate to severe emphysema according to her recent PFTs on 07/03/15 which showed FEV1 .93; however, a FEV1/FVC ratio of 102. The FEV1/FVC ratio is likely falsely elevated as the FVC was much lower than it likely truly is given patient's length of expiration. RV high at 323. Patient has had multiple exacerbations in that last year and has only been on oxygen and albuterol. Patient has done well after discharge and has been complaint with Spiriva, she finished her prednisone taper and has not been having to use her albuterol or use her oxygen more often.   Plan: -Continue albuterol prn -Continue Spiriva QD -Oxygen as needed

## 2015-07-22 NOTE — Assessment & Plan Note (Signed)
Lab Results  Component Value Date   HGBA1C 11.9* 06/18/2015   HGBA1C 9.6 01/14/2015   HGBA1C 11.2 10/23/2014     Assessment: Diabetes control:  Uncontrolled Progress toward A1C goal:   Deteriorated  Comments: CBGs have been labile given recent use of prednisone. [facility] pharmacy has been titrating her insulin over the phone according to her CBGs. Patient is currently  on Lantus 30 units BID and Humalog 5 units before breakfast and before dinner. CBGs have ranged from 69 to 480.    Plan: Medications:  continue current medications Home glucose monitoring: Frequency:  TID Timing:  ACHS Instruction/counseling given: discussed the need for weight loss and discussed diet Educational resources provided:   Self management tools provided: copy of home glucose meter download Other plans: Continue titration with [facility] pharmacy

## 2015-07-23 ENCOUNTER — Telehealth: Payer: Self-pay | Admitting: Internal Medicine

## 2015-07-23 ENCOUNTER — Encounter: Payer: Self-pay | Admitting: Dietician

## 2015-07-23 NOTE — Telephone Encounter (Signed)
Diane from [facility] want to informed the doctor that there were no visit on 07/23/2015 with the pt. The visit with the pt will be on 07/24/2015.

## 2015-07-23 NOTE — Progress Notes (Signed)
Patient ID: [name], female   DOB: 12-07-1950, 64 y.o.   MRN: [id number] Patient is off steroids now, so DM & Steroid  protocol done. Patient next appointment with her doctor is February 2016 and A1C is 11.9%.  She is currently on 30 units of Lantus twice daily and Novolog 5 units twice daily.   CDE reviewed her meter download. Her average for past 30 days is 286 consistent with an A1C of ~ 11.6%. Standard day shows increasing CBGs over course of day and some fasting in target and others higher than target. Lowest recorded evening reading is ~ 250 mg/dl.  Wonder if she forgets her second dose of Lantus sometimes?     Suggest Lantus once daily 50 units in am and Humalog 10 units Bid before breakfast and dinner meals to provide more prandial coverage. CDE is happy to call patient as needed

## 2015-07-24 DIAGNOSIS — Z9981 Dependence on supplemental oxygen: Secondary | ICD-10-CM | POA: Diagnosis not present

## 2015-07-24 DIAGNOSIS — I5040 Unspecified combined systolic (congestive) and diastolic (congestive) heart failure: Secondary | ICD-10-CM | POA: Diagnosis not present

## 2015-07-24 DIAGNOSIS — Z794 Long term (current) use of insulin: Secondary | ICD-10-CM | POA: Diagnosis not present

## 2015-07-24 DIAGNOSIS — N182 Chronic kidney disease, stage 2 (mild): Secondary | ICD-10-CM | POA: Diagnosis not present

## 2015-07-24 DIAGNOSIS — E1122 Type 2 diabetes mellitus with diabetic chronic kidney disease: Secondary | ICD-10-CM | POA: Diagnosis not present

## 2015-07-24 DIAGNOSIS — G4730 Sleep apnea, unspecified: Secondary | ICD-10-CM | POA: Diagnosis not present

## 2015-07-24 DIAGNOSIS — J441 Chronic obstructive pulmonary disease with (acute) exacerbation: Secondary | ICD-10-CM | POA: Diagnosis not present

## 2015-07-24 DIAGNOSIS — I129 Hypertensive chronic kidney disease with stage 1 through stage 4 chronic kidney disease, or unspecified chronic kidney disease: Secondary | ICD-10-CM | POA: Diagnosis not present

## 2015-07-24 NOTE — Telephone Encounter (Signed)
Patients [name] visit postponed by 1 day, FYI from home care agency.

## 2015-07-25 DIAGNOSIS — N182 Chronic kidney disease, stage 2 (mild): Secondary | ICD-10-CM | POA: Diagnosis not present

## 2015-07-25 DIAGNOSIS — G4730 Sleep apnea, unspecified: Secondary | ICD-10-CM | POA: Diagnosis not present

## 2015-07-25 DIAGNOSIS — I5040 Unspecified combined systolic (congestive) and diastolic (congestive) heart failure: Secondary | ICD-10-CM | POA: Diagnosis not present

## 2015-07-25 DIAGNOSIS — I129 Hypertensive chronic kidney disease with stage 1 through stage 4 chronic kidney disease, or unspecified chronic kidney disease: Secondary | ICD-10-CM | POA: Diagnosis not present

## 2015-07-25 DIAGNOSIS — Z794 Long term (current) use of insulin: Secondary | ICD-10-CM | POA: Diagnosis not present

## 2015-07-25 DIAGNOSIS — J441 Chronic obstructive pulmonary disease with (acute) exacerbation: Secondary | ICD-10-CM | POA: Diagnosis not present

## 2015-07-25 DIAGNOSIS — E1122 Type 2 diabetes mellitus with diabetic chronic kidney disease: Secondary | ICD-10-CM | POA: Diagnosis not present

## 2015-07-25 DIAGNOSIS — Z9981 Dependence on supplemental oxygen: Secondary | ICD-10-CM | POA: Diagnosis not present

## 2015-07-28 DIAGNOSIS — Z794 Long term (current) use of insulin: Secondary | ICD-10-CM | POA: Diagnosis not present

## 2015-07-28 DIAGNOSIS — G4730 Sleep apnea, unspecified: Secondary | ICD-10-CM | POA: Diagnosis not present

## 2015-07-28 DIAGNOSIS — Z9981 Dependence on supplemental oxygen: Secondary | ICD-10-CM | POA: Diagnosis not present

## 2015-07-28 DIAGNOSIS — I5040 Unspecified combined systolic (congestive) and diastolic (congestive) heart failure: Secondary | ICD-10-CM | POA: Diagnosis not present

## 2015-07-28 DIAGNOSIS — I129 Hypertensive chronic kidney disease with stage 1 through stage 4 chronic kidney disease, or unspecified chronic kidney disease: Secondary | ICD-10-CM | POA: Diagnosis not present

## 2015-07-28 DIAGNOSIS — N182 Chronic kidney disease, stage 2 (mild): Secondary | ICD-10-CM | POA: Diagnosis not present

## 2015-07-28 DIAGNOSIS — J441 Chronic obstructive pulmonary disease with (acute) exacerbation: Secondary | ICD-10-CM | POA: Diagnosis not present

## 2015-07-28 DIAGNOSIS — E1122 Type 2 diabetes mellitus with diabetic chronic kidney disease: Secondary | ICD-10-CM | POA: Diagnosis not present

## 2015-07-28 NOTE — Progress Notes (Signed)
Case discussed with Dr. Marvel Plan soon after the resident saw the patient.  We reviewed the resident's history and exam and pertinent patient test results.  I agree with the assessment, diagnosis, and plan of care documented in the resident's note.  She will require assessment for continued eligibility for home oxygen therapy at the follow-up visit.

## 2015-07-29 DIAGNOSIS — N182 Chronic kidney disease, stage 2 (mild): Secondary | ICD-10-CM | POA: Diagnosis not present

## 2015-07-29 DIAGNOSIS — I129 Hypertensive chronic kidney disease with stage 1 through stage 4 chronic kidney disease, or unspecified chronic kidney disease: Secondary | ICD-10-CM | POA: Diagnosis not present

## 2015-07-29 DIAGNOSIS — I5040 Unspecified combined systolic (congestive) and diastolic (congestive) heart failure: Secondary | ICD-10-CM | POA: Diagnosis not present

## 2015-07-29 DIAGNOSIS — Z9981 Dependence on supplemental oxygen: Secondary | ICD-10-CM | POA: Diagnosis not present

## 2015-07-29 DIAGNOSIS — J441 Chronic obstructive pulmonary disease with (acute) exacerbation: Secondary | ICD-10-CM | POA: Diagnosis not present

## 2015-07-29 DIAGNOSIS — Z794 Long term (current) use of insulin: Secondary | ICD-10-CM | POA: Diagnosis not present

## 2015-07-29 DIAGNOSIS — G4730 Sleep apnea, unspecified: Secondary | ICD-10-CM | POA: Diagnosis not present

## 2015-07-29 DIAGNOSIS — E1122 Type 2 diabetes mellitus with diabetic chronic kidney disease: Secondary | ICD-10-CM | POA: Diagnosis not present

## 2015-07-30 DIAGNOSIS — J441 Chronic obstructive pulmonary disease with (acute) exacerbation: Secondary | ICD-10-CM | POA: Diagnosis not present

## 2015-07-30 DIAGNOSIS — J449 Chronic obstructive pulmonary disease, unspecified: Secondary | ICD-10-CM | POA: Diagnosis not present

## 2015-07-30 DIAGNOSIS — G4730 Sleep apnea, unspecified: Secondary | ICD-10-CM | POA: Diagnosis not present

## 2015-07-30 DIAGNOSIS — E1122 Type 2 diabetes mellitus with diabetic chronic kidney disease: Secondary | ICD-10-CM | POA: Diagnosis not present

## 2015-07-30 DIAGNOSIS — Z9981 Dependence on supplemental oxygen: Secondary | ICD-10-CM | POA: Diagnosis not present

## 2015-07-30 DIAGNOSIS — N182 Chronic kidney disease, stage 2 (mild): Secondary | ICD-10-CM | POA: Diagnosis not present

## 2015-07-30 DIAGNOSIS — Z794 Long term (current) use of insulin: Secondary | ICD-10-CM | POA: Diagnosis not present

## 2015-07-30 DIAGNOSIS — I129 Hypertensive chronic kidney disease with stage 1 through stage 4 chronic kidney disease, or unspecified chronic kidney disease: Secondary | ICD-10-CM | POA: Diagnosis not present

## 2015-07-30 DIAGNOSIS — I5040 Unspecified combined systolic (congestive) and diastolic (congestive) heart failure: Secondary | ICD-10-CM | POA: Diagnosis not present

## 2015-08-04 ENCOUNTER — Ambulatory Visit (INDEPENDENT_AMBULATORY_CARE_PROVIDER_SITE_OTHER): Payer: Medicare Other | Admitting: Pharmacist

## 2015-08-04 ENCOUNTER — Ambulatory Visit: Payer: Medicare Other | Admitting: Internal Medicine

## 2015-08-04 DIAGNOSIS — E119 Type 2 diabetes mellitus without complications: Secondary | ICD-10-CM

## 2015-08-04 DIAGNOSIS — F17200 Nicotine dependence, unspecified, uncomplicated: Secondary | ICD-10-CM

## 2015-08-04 DIAGNOSIS — E1122 Type 2 diabetes mellitus with diabetic chronic kidney disease: Secondary | ICD-10-CM

## 2015-08-04 DIAGNOSIS — J449 Chronic obstructive pulmonary disease, unspecified: Secondary | ICD-10-CM

## 2015-08-04 DIAGNOSIS — Z79899 Other long term (current) drug therapy: Secondary | ICD-10-CM

## 2015-08-04 DIAGNOSIS — Z794 Long term (current) use of insulin: Secondary | ICD-10-CM

## 2015-08-04 DIAGNOSIS — Z719 Counseling, unspecified: Secondary | ICD-10-CM

## 2015-08-04 DIAGNOSIS — F17210 Nicotine dependence, cigarettes, uncomplicated: Secondary | ICD-10-CM

## 2015-08-04 DIAGNOSIS — N182 Chronic kidney disease, stage 2 (mild): Secondary | ICD-10-CM

## 2015-08-04 NOTE — Progress Notes (Signed)
S: [name] is a 64 y.o. female reports to clinical pharmacist appointment for help with medication management per PCP referral. Patient did not bring medication bottles.  Allergies  Allergen Reactions  . Actos [Pioglitazone]     REACTION: congestive heart failure ACTOS  . Avandia [Rosiglitazone Maleate]     unknown  . Hydrocortisone     unknown   Past Medical History  Diagnosis Date  . Hyperthyroidism, subclinical   . Post menopausal syndrome   . Obesity   . Cardiac defibrillator in situ     Atlas II VR (SJM) implanted by Dr Lovena Le  . Eczema   . Lipoma   . Chronic ulcer of leg ([place])     04-09-15 resolved-not a problem.  . Hyperlipidemia   . Chronic combined systolic and diastolic heart failure (HCC)     a. EF 35-40% in past;  b. Echo 7/13:  EF 45-50%, Gr 2 diast dysfn, mild AI, mild MAC, trivial MR, mild LAE, PASP 47.  [name] NICM (nonischemic cardiomyopathy) ([place])   . Diabetes mellitus   . HTN (hypertension)   . Elevated alkaline phosphatase level     GGT and 5'nucleotidase 8/13 normal  . Hx of cardiac cath     a. [place] 2003 normal;  b. LHC 6/13:  Mild calcification in the LM, o/w normal coronary arteries, EF 45%.   . Sleep apnea     pt denies 04/12/2013  . COPD (chronic obstructive pulmonary disease) (HCC)     O2 at night  . Automatic implantable cardioverter-defibrillator in situ   . CHF (congestive heart failure) ([place])   . Depression   . Implantable cardioverter-defibrillator (ICD) generator end of life   . Colon polyps 04/12/2013    Rectosigmoid polyp  . History of oxygen administration     oxygen @ 2 l/m nasally bedtime 24/7  . ATN (acute tubular necrosis) ([place]) 07/15/2014   Social History   Social History  . Marital Status: Widowed    Spouse Name: Alroy Dust  . Number of Children: 3  . Years of Education: 11   Occupational History  . disabled    Social History Main Topics  . Smoking status: Light Tobacco Smoker -- 0.20 packs/day for 35 years    Types:  Cigarettes  . Smokeless tobacco: Never Used     Comment: Trying to quit.  . Alcohol Use: No  . Drug Use: No  . Sexual Activity: Not on file   Other Topics Concern  . Not on file   Social History Narrative   ** Merged History Encounter **       Married   DIET: breakfast-sausage, eggs, grits toast, coffee with sugar substitute; lunch-usually skips lunch but may have a sandwich sometimes; dinner: beans, wings, mac & cheese, bread; snacks-fruit, jello, ice cream (encouraged low-sugar options)  Alcohol (one drink per month), smokes 2 cigarettes per day   O:    Component Value Date/Time   CHOL 144 07/21/2014 1636   HDL 54 07/21/2014 1636   TRIG 149 07/21/2014 1636   AST 15 02/26/2015 0913   ALT 13 02/26/2015 0913   NA 138 07/14/2015 0622   K 3.9 07/14/2015 0622   CL 94* 07/14/2015 0622   CO2 36* 07/14/2015 0622   GLUCOSE 265* 07/14/2015 0622   HGBA1C 11.9* 06/18/2015 2006   HGBA1C 9.6 01/14/2015 1106   BUN 26* 07/14/2015 0622   CREATININE 0.93 07/14/2015 0622   CREATININE 0.84 02/13/2015 1015   CALCIUM 9.5 07/14/2015 0622  GFRAA >60 07/14/2015 0622   GFRAA 87 02/13/2015 1015   WBC 12.9* 07/14/2015 0622   WBC 9.2 06/02/2015 1213   HGB 12.2 07/14/2015 0622   HCT 38.9 07/14/2015 0622   HCT 42.5 06/02/2015 1213   PLT 275 07/14/2015 0622   TSH 0.415 08/06/2014 0939   Ht Readings from Last 2 Encounters:  07/11/15 5' 0.25" (1.53 m)  06/18/15 5' (1.524 m)   Wt Readings from Last 2 Encounters:  07/22/15 238 lb 11.2 oz (108.274 kg)  07/11/15 236 lb (107.049 kg)   There is no weight on file to calculate BMI. BP Readings from Last 3 Encounters:  07/22/15 125/75  07/14/15 129/53  06/24/15 124/67   A/P (formulated with PCP, Dr. Marvel Plan): Challenging assessment since patient did not bring meds with her today.  COPD medication review  Patient has had 2 recent hospitalizations for COPD exacerbation, for which she has completed 2 courses of prednisone therapy. She  reports no trouble breathing at this time.  Verified access to and understanding of inhalers-patient reports no concerns at this time  Patient states she is ready to quit smoking. Discussed patient preferences and she feels she would benefit from NRT. Approved by Dr. Marvel Plan and Rx sent. Patient also referred to [place].  DM medication review  Patient reports home CBG 200-300s, including in the morning prior to breakfast, no signs or symptoms of hyper or hypoglycemia.  Reports taking 30 units of insulin glargine BID and 5 units of insulin lispro prior to breakfast and dinner. Patient advised to take 5 units of insulin lispro prior to lunch on days when she does have lunch (reports pre-lunch CBGs 200-300 as well).  Working with Dr. Marvel Plan to re-initiate metformin therapy, which was stopped recently d/t AKI. BMP ordered to verify stable renal function and plan to initiate low-dose metformin at that time with subsequent titration as tolerated.  Will continue to follow up with patient to help with titration of medications as needed (PCP appointment in Feb. 2017).   Patient would also benefit from assistance with lifestyle management. Will coordinate care with CDE.  An after visit summary was provided and patient advised to follow up in 2 weeks or sooner if any changes in condition or questions regarding medications arise.   The patient verbalized understanding of information provided by repeating back concepts discussed.   30 minutes spent face-to-face with the patient during the encounter. 50% of time spent on education. 50% of time was spent on assessment and plan.

## 2015-08-05 ENCOUNTER — Encounter: Payer: Self-pay | Admitting: Pharmacist

## 2015-08-05 DIAGNOSIS — G4730 Sleep apnea, unspecified: Secondary | ICD-10-CM | POA: Diagnosis not present

## 2015-08-05 DIAGNOSIS — Z794 Long term (current) use of insulin: Secondary | ICD-10-CM | POA: Diagnosis not present

## 2015-08-05 DIAGNOSIS — N182 Chronic kidney disease, stage 2 (mild): Secondary | ICD-10-CM | POA: Diagnosis not present

## 2015-08-05 DIAGNOSIS — J441 Chronic obstructive pulmonary disease with (acute) exacerbation: Secondary | ICD-10-CM | POA: Diagnosis not present

## 2015-08-05 DIAGNOSIS — I5040 Unspecified combined systolic (congestive) and diastolic (congestive) heart failure: Secondary | ICD-10-CM | POA: Diagnosis not present

## 2015-08-05 DIAGNOSIS — E1122 Type 2 diabetes mellitus with diabetic chronic kidney disease: Secondary | ICD-10-CM | POA: Diagnosis not present

## 2015-08-05 DIAGNOSIS — I129 Hypertensive chronic kidney disease with stage 1 through stage 4 chronic kidney disease, or unspecified chronic kidney disease: Secondary | ICD-10-CM | POA: Diagnosis not present

## 2015-08-05 DIAGNOSIS — Z9981 Dependence on supplemental oxygen: Secondary | ICD-10-CM | POA: Diagnosis not present

## 2015-08-06 ENCOUNTER — Encounter: Payer: Self-pay | Admitting: Pharmacist

## 2015-08-06 MED ORDER — NICOTINE POLACRILEX 2 MG MT GUM: CHEWING_GUM | OROMUCOSAL | Status: DC

## 2015-08-06 MED ORDER — NICOTINE 7 MG/24HR TD PT24: 7.0000 mg | MEDICATED_PATCH | Freq: Every day | TRANSDERMAL | Status: DC

## 2015-08-06 NOTE — Patient Instructions (Signed)
Insulin instructions: Glargine (Lantus) 60 units daily Lispro (Humalog) 5 units before breakfast, lunch, and dinner  Nicotine Patches  7 mg: 1 patch daily  Start using the patches on your quit date.  Apply to clean, dry, hairless area between neck and waste.  Rotate sites daily, do not smoke while on the patch.  If sleep problems occur, remove at bedtime.  You may be on this medication for weeks or months.  Please notify me if you experience any side effects or concerns.  Nicotine Gum  Start taking this on your quit date.  Do not eat or drink anything except water 15 minutes before chewing gum; wait until after you spit the gum out to eat or drink.  Chew until you notice a peppery taste, and then park in your cheek. After a few minutes, chew again and then park in a different cheek. Do this for about 30 minutes, then spit gum out.  You may be on this medication for weeks or months.  Please notify me if you experience any side effects or concerns.

## 2015-08-18 ENCOUNTER — Other Ambulatory Visit: Payer: Medicare Other

## 2015-08-18 ENCOUNTER — Ambulatory Visit: Payer: Medicare Other | Admitting: Pharmacist

## 2015-08-19 DIAGNOSIS — G4730 Sleep apnea, unspecified: Secondary | ICD-10-CM | POA: Diagnosis not present

## 2015-08-19 DIAGNOSIS — I5040 Unspecified combined systolic (congestive) and diastolic (congestive) heart failure: Secondary | ICD-10-CM | POA: Diagnosis not present

## 2015-08-19 DIAGNOSIS — N182 Chronic kidney disease, stage 2 (mild): Secondary | ICD-10-CM | POA: Diagnosis not present

## 2015-08-19 DIAGNOSIS — Z9981 Dependence on supplemental oxygen: Secondary | ICD-10-CM | POA: Diagnosis not present

## 2015-08-19 DIAGNOSIS — J441 Chronic obstructive pulmonary disease with (acute) exacerbation: Secondary | ICD-10-CM | POA: Diagnosis not present

## 2015-08-19 DIAGNOSIS — E1122 Type 2 diabetes mellitus with diabetic chronic kidney disease: Secondary | ICD-10-CM | POA: Diagnosis not present

## 2015-08-19 DIAGNOSIS — Z794 Long term (current) use of insulin: Secondary | ICD-10-CM | POA: Diagnosis not present

## 2015-08-19 DIAGNOSIS — I129 Hypertensive chronic kidney disease with stage 1 through stage 4 chronic kidney disease, or unspecified chronic kidney disease: Secondary | ICD-10-CM | POA: Diagnosis not present

## 2015-08-26 ENCOUNTER — Encounter: Payer: Self-pay | Admitting: Internal Medicine

## 2015-08-26 ENCOUNTER — Other Ambulatory Visit: Payer: Self-pay

## 2015-08-26 ENCOUNTER — Telehealth: Payer: Self-pay | Admitting: Licensed Clinical Social Worker

## 2015-08-26 ENCOUNTER — Ambulatory Visit (INDEPENDENT_AMBULATORY_CARE_PROVIDER_SITE_OTHER): Payer: Medicare Other | Admitting: Internal Medicine

## 2015-08-26 VITALS — BP 150/84 | HR 88 | Ht 60.25 in | Wt 235.0 lb

## 2015-08-26 DIAGNOSIS — I5042 Chronic combined systolic (congestive) and diastolic (congestive) heart failure: Secondary | ICD-10-CM

## 2015-08-26 DIAGNOSIS — I10 Essential (primary) hypertension: Secondary | ICD-10-CM | POA: Diagnosis not present

## 2015-08-26 DIAGNOSIS — I428 Other cardiomyopathies: Secondary | ICD-10-CM

## 2015-08-26 DIAGNOSIS — I429 Cardiomyopathy, unspecified: Secondary | ICD-10-CM | POA: Diagnosis not present

## 2015-08-26 DIAGNOSIS — Z95810 Presence of automatic (implantable) cardiac defibrillator: Secondary | ICD-10-CM | POA: Diagnosis not present

## 2015-08-26 NOTE — Assessment & Plan Note (Signed)
Her systolic blood pressure is elevated. She is encouraged to lose weight. SHe will continue her current meds.

## 2015-08-26 NOTE — Telephone Encounter (Signed)
CSW received voice mail from [name].  CSW attempted return call x2 phone line rang busy.

## 2015-08-26 NOTE — Patient Instructions (Signed)
Medication Instructions:  Your physician recommends that you continue on your current medications as directed. Please refer to the Current Medication list given to you today.   Labwork: None ordered   Testing/Procedures: None ordered   Follow-Up: Your physician wants you to follow-up in: 12 months with Dr Knox Saliva will receive a reminder letter in the mail two months in advance. If you don't receive a letter, please call our office to schedule the follow-up appointment.  Remote monitoring is used to monitor your ICD from home. This monitoring reduces the number of office visits required to check your device to one time per year. It allows [place] to keep an eye on the functioning of your device to ensure it is working properly. You are scheduled for a device check from home on 11/23/15. You may send your transmission at any time that day. If you have a wireless device, the transmission will be sent automatically. After your physician reviews your transmission, you will receive a postcard with your next transmission date.     Any Other Special Instructions Will Be Listed Below (If Applicable).     If you need a refill on your cardiac medications before your next appointment, please call your pharmacy.

## 2015-08-26 NOTE — Assessment & Plan Note (Signed)
Her symptoms remain class 2. She is encouraged to maintain a low sodium diet. Her fluid index looks good.

## 2015-08-26 NOTE — Assessment & Plan Note (Signed)
Her St. Jude device is working normally. Will recheck in several months. 

## 2015-08-26 NOTE — Progress Notes (Signed)
HPI [name] returns today for followup. She is a very pleasant 64 year old woman with a nonischemic cardiomyopathy, obesity, hypertension, diabetes, and COPD. She denies chest pain or syncope. She has been fairly sedentary. She has not been hospitalized with worsening heart failure symptoms. She admits to dietary and medical noncompliance. No ICD shocks.  She has been having problems with sciatica.  Allergies  Allergen Reactions  . Actos [Pioglitazone]     REACTION: congestive heart failure ACTOS  . Avandia [Rosiglitazone Maleate]     unknown  . Hydrocortisone     unknown     Current Outpatient Prescriptions  Medication Sig Dispense Refill  . albuterol (PROVENTIL) (2.5 MG/3ML) 0.083% nebulizer solution USE 1 VIAL VIA NEBULIZER EVERY 6 HOURS AS NEEDED FOR WHEEZING OR SHORTNESS OF BREATH 1125 mL 3  . albuterol (VENTOLIN HFA) 108 (90 BASE) MCG/ACT inhaler Inhale 2 puffs into the lungs every 6 (six) hours as needed. Shortness of breath 1 Inhaler 6  . amLODipine (NORVASC) 10 MG tablet TAKE 1 TABLET BY MOUTH DAILY 30 tablet 11  . aspirin EC 81 MG tablet Take 81 mg by mouth daily.    [name] atorvastatin (LIPITOR) 80 MG tablet Take 1 tablet (80 mg total) by mouth at bedtime. 90 tablet 3  . cyclobenzaprine (FEXMID) 7.5 MG tablet Take 1 tablet (7.5 mg total) by mouth 3 (three) times daily as needed for muscle spasms. 30 tablet 0  . furosemide (LASIX) 20 MG tablet Take 2 tablets (40 mg total) by mouth daily. 60 tablet 3  . gabapentin (NEURONTIN) 300 MG capsule Take 1 capsule (300 mg total) by mouth 3 (three) times daily. 30 capsule 11  . glucose blood (ONE TOUCH ULTRA TEST) test strip Use as instructed by doctor up to 3x daily,dx code 250.02 insulin requiring 100 each 5  . insulin glargine (LANTUS) 100 UNIT/ML injection Inject 60 Units into the skin daily.     . insulin lispro (HUMALOG KWIKPEN) 100 UNIT/ML KiwkPen Inject 5 units with breakfast and 5 units with dinner, hold if CBGs low or not eating and  call MD (Patient taking differently: Inject 5 units with breakfast, lunch, and dinner, hold if CBGs low or not eating and call MD) 15 mL 3  . lisinopril (PRINIVIL,ZESTRIL) 20 MG tablet Take 2 tablets by mouth daily.  1  . metoprolol succinate (TOPROL-XL) 50 MG 24 hr tablet Take 1 tablet (50 mg total) by mouth daily. 90 tablet 3  . naproxen (NAPROSYN) 500 MG tablet Take 1 tablet (500 mg total) by mouth 2 (two) times daily with a meal. 28 tablet 0  . nitroGLYCERIN (NITROSTAT) 0.4 MG SL tablet Place 0.4 mg under the tongue every 5 (five) minutes as needed for chest pain.    [name] omeprazole (PRILOSEC) 20 MG capsule Take one tablet daily 30 minutes before breakfast 30 capsule 2  . tiotropium (SPIRIVA) 18 MCG inhalation capsule Place 1 capsule (18 mcg total) into inhaler and inhale daily. 30 capsule 12  . nicotine (NICODERM CQ - DOSED IN MG/24 HR) 7 mg/24hr patch Place 1 patch (7 mg total) onto the skin daily. Remove at bedtime if sleep disturbance occurs. (Patient not taking: Reported on 08/26/2015) 30 patch 1  . nicotine polacrilex (NICORETTE) 2 MG gum Chew & park PRN craving ([name] pls counsel) Wks 1-4: 1 piece Q 1-2 hrs PRN Wks 5-8: 1 piece Q 2-4 hrs PRN Wks 9-12: 1 piece Q 4-8 hrs PRN (Patient not taking: Reported on 08/26/2015) 500 each 2  No current facility-administered medications for this visit.     Past Medical History  Diagnosis Date  . Hyperthyroidism, subclinical   . Post menopausal syndrome   . Obesity   . Cardiac defibrillator in situ     Atlas II VR (SJM) implanted by Dr Lovena Le  . Eczema   . Lipoma   . Chronic ulcer of leg ([place])     04-09-15 resolved-not a problem.  . Hyperlipidemia   . Chronic combined systolic and diastolic heart failure (HCC)     a. EF 35-40% in past;  b. Echo 7/13:  EF 45-50%, Gr 2 diast dysfn, mild AI, mild MAC, trivial MR, mild LAE, PASP 47.  [name] NICM (nonischemic cardiomyopathy) ([place])   . Diabetes mellitus   . HTN (hypertension)   . Elevated alkaline  phosphatase level     GGT and 5'nucleotidase 8/13 normal  . Hx of cardiac cath     a. [place] 2003 normal;  b. LHC 6/13:  Mild calcification in the LM, o/w normal coronary arteries, EF 45%.   . Sleep apnea     pt denies 04/12/2013  . COPD (chronic obstructive pulmonary disease) (HCC)     O2 at night  . Automatic implantable cardioverter-defibrillator in situ   . CHF (congestive heart failure) ([place])   . Depression   . Implantable cardioverter-defibrillator (ICD) generator end of life   . Colon polyps 04/12/2013    Rectosigmoid polyp  . History of oxygen administration     oxygen @ 2 l/m nasally bedtime 24/7  . ATN (acute tubular necrosis) ([place]) 07/15/2014    ROS:   All systems reviewed and negative except as noted in the HPI.   Past Surgical History  Procedure Laterality Date  . Cardiac defibrillator placement  05/04/2007    SJM Atlas II VR ICD  . Hysteroscopy    . Cardiac defibrillator placement    . Abdominal hysterectomy    . Cardiac catheterization    . Insert / replace / remove pacemaker    . Tubal ligation    . Hernia repair    . Colonoscopy N/A 04/12/2013    Procedure: COLONOSCOPY;  Surgeon: Beryle Beams, MD;  Location: [facility] ENDOSCOPY;  Service: Endoscopy;  Laterality: N/A;  pt.has defibrilator  . Left heart catheterization with coronary angiogram N/A 04/02/2012    Procedure: LEFT HEART CATHETERIZATION WITH CORONARY ANGIOGRAM;  Surgeon: Hillary Bow, MD;  Location: [facility] CATH LAB;  Service: Cardiovascular;  Laterality: N/A;  . Implantable cardioverter defibrillator (icd) generator change N/A 04/02/2014    Procedure: ICD GENERATOR CHANGE;  Surgeon: Evans Lance, MD;  Location: [facility] CATH LAB;  Service: Cardiovascular;  Laterality: N/A;  . Esophagogastroduodenoscopy N/A 04/16/2015    Procedure: ESOPHAGOGASTRODUODENOSCOPY (EGD);  Surgeon: [name], MD;  Location: [facility] ENDOSCOPY;  Service: Endoscopy;  Laterality: N/A;  . Colonoscopy N/A 04/16/2015    Procedure: COLONOSCOPY;   Surgeon: [name], MD;  Location: [facility] ENDOSCOPY;  Service: Endoscopy;  Laterality: N/A;     Family History  Problem Relation Age of Onset  . Stroke Mother   . Seizures Father   . Diabetes Sister   . Asthma Maternal Aunt     aunts  . Asthma Maternal Uncle     uncles  . Heart disease Father   . Heart disease Paternal Aunt     aunts  . Heart disease Paternal Uncle     uncles  . Heart disease Maternal Aunt     aunts  .  Heart disease Maternal Uncle     uncles  . Heart disease Maternal Grandfather   . Colon cancer Neg Hx   . Colon polyps Neg Hx   . Esophageal cancer Neg Hx   . Kidney disease Neg Hx   . Gallbladder disease Neg Hx      Social History   Social History  . Marital Status: Widowed    Spouse Name: Alroy Dust  . Number of Children: 3  . Years of Education: 11   Occupational History  . disabled    Social History Main Topics  . Smoking status: Light Tobacco Smoker -- 0.20 packs/day for 35 years    Types: Cigarettes  . Smokeless tobacco: Never Used     Comment: Trying to quit.  . Alcohol Use: No  . Drug Use: No  . Sexual Activity: Not on file   Other Topics Concern  . Not on file   Social History Narrative   ** Merged History Encounter **       Married     BP 150/84 mmHg  Pulse 88  Ht 5' 0.25" (1.53 m)  Wt 235 lb (106.595 kg)  BMI 45.54 kg/m2  Physical Exam:  Well appearing obese,middle-aged woman, NAD HEENT: Unremarkable Neck:  7 cm JVD, no thyromegally Lungs:  Clear with no wheezes, rales, or rhonchi. HEART:  Regular rate rhythm, no murmurs, no rubs, no clicks Abd:  soft, positive bowel sounds, no organomegally, no rebound, no guarding Ext:  2 plus pulses, trace peripheral edema, no cyanosis, no clubbing Skin:  No rashes no nodules Neuro:  CN II through XII intact, motor grossly intact  DEVICE  Normal device function.  See PaceArt for details.   Assess/Plan:

## 2015-08-30 DIAGNOSIS — J449 Chronic obstructive pulmonary disease, unspecified: Secondary | ICD-10-CM | POA: Diagnosis not present

## 2015-09-16 ENCOUNTER — Encounter: Payer: Medicare Other | Admitting: Internal Medicine

## 2015-09-18 ENCOUNTER — Encounter: Payer: Self-pay | Admitting: Pharmacist

## 2015-09-18 NOTE — Progress Notes (Signed)
Patient ID: [name], female   DOB: 05/17/1951, 64 y.o.   MRN: [id number]  Fax received from [facility] confirming that patient accepted their services per our referral.

## 2015-09-29 DIAGNOSIS — J449 Chronic obstructive pulmonary disease, unspecified: Secondary | ICD-10-CM | POA: Diagnosis not present

## 2015-10-30 DIAGNOSIS — J449 Chronic obstructive pulmonary disease, unspecified: Secondary | ICD-10-CM | POA: Diagnosis not present

## 2015-11-04 ENCOUNTER — Other Ambulatory Visit: Payer: Self-pay | Admitting: Internal Medicine

## 2015-11-11 ENCOUNTER — Other Ambulatory Visit: Payer: Self-pay | Admitting: Internal Medicine

## 2015-11-11 ENCOUNTER — Ambulatory Visit (INDEPENDENT_AMBULATORY_CARE_PROVIDER_SITE_OTHER): Payer: Medicare Other | Admitting: Internal Medicine

## 2015-11-11 ENCOUNTER — Encounter: Payer: Self-pay | Admitting: Internal Medicine

## 2015-11-11 ENCOUNTER — Ambulatory Visit (INDEPENDENT_AMBULATORY_CARE_PROVIDER_SITE_OTHER): Payer: Medicare Other | Admitting: Pharmacist

## 2015-11-11 VITALS — BP 116/69 | HR 88 | Temp 98.0°F | Ht 60.0 in | Wt 237.1 lb

## 2015-11-11 DIAGNOSIS — E785 Hyperlipidemia, unspecified: Secondary | ICD-10-CM

## 2015-11-11 DIAGNOSIS — J439 Emphysema, unspecified: Secondary | ICD-10-CM

## 2015-11-11 DIAGNOSIS — M5116 Intervertebral disc disorders with radiculopathy, lumbar region: Secondary | ICD-10-CM

## 2015-11-11 DIAGNOSIS — N182 Chronic kidney disease, stage 2 (mild): Secondary | ICD-10-CM | POA: Diagnosis not present

## 2015-11-11 DIAGNOSIS — I10 Essential (primary) hypertension: Secondary | ICD-10-CM

## 2015-11-11 DIAGNOSIS — IMO0002 Reserved for concepts with insufficient information to code with codable children: Secondary | ICD-10-CM

## 2015-11-11 DIAGNOSIS — J449 Chronic obstructive pulmonary disease, unspecified: Secondary | ICD-10-CM

## 2015-11-11 DIAGNOSIS — Z79899 Other long term (current) drug therapy: Secondary | ICD-10-CM

## 2015-11-11 DIAGNOSIS — Z7951 Long term (current) use of inhaled steroids: Secondary | ICD-10-CM

## 2015-11-11 DIAGNOSIS — Z794 Long term (current) use of insulin: Secondary | ICD-10-CM

## 2015-11-11 DIAGNOSIS — I130 Hypertensive heart and chronic kidney disease with heart failure and stage 1 through stage 4 chronic kidney disease, or unspecified chronic kidney disease: Secondary | ICD-10-CM

## 2015-11-11 DIAGNOSIS — E1169 Type 2 diabetes mellitus with other specified complication: Secondary | ICD-10-CM

## 2015-11-11 DIAGNOSIS — E1165 Type 2 diabetes mellitus with hyperglycemia: Secondary | ICD-10-CM

## 2015-11-11 DIAGNOSIS — I5042 Chronic combined systolic (congestive) and diastolic (congestive) heart failure: Secondary | ICD-10-CM

## 2015-11-11 DIAGNOSIS — E1122 Type 2 diabetes mellitus with diabetic chronic kidney disease: Secondary | ICD-10-CM

## 2015-11-11 DIAGNOSIS — K6289 Other specified diseases of anus and rectum: Secondary | ICD-10-CM | POA: Insufficient documentation

## 2015-11-11 DIAGNOSIS — Z6841 Body Mass Index (BMI) 40.0 and over, adult: Secondary | ICD-10-CM

## 2015-11-11 LAB — GLUCOSE, CAPILLARY: Glucose-Capillary: 213 mg/dL — ABNORMAL HIGH (ref 65–99)

## 2015-11-11 LAB — POCT GLYCOSYLATED HEMOGLOBIN (HGB A1C): Hemoglobin A1C: 12.8

## 2015-11-11 MED ORDER — INSULIN LISPRO 100 UNIT/ML (KWIKPEN): 8.0000 [IU] | PEN_INJECTOR | Freq: Three times a day (TID) | SUBCUTANEOUS | Status: DC

## 2015-11-11 MED ORDER — GABAPENTIN 300 MG PO CAPS: 600.0000 mg | ORAL_CAPSULE | Freq: Three times a day (TID) | ORAL | Status: DC

## 2015-11-11 MED ORDER — LISINOPRIL 40 MG PO TABS: 40.0000 mg | ORAL_TABLET | Freq: Every day | ORAL | Status: DC

## 2015-11-11 MED ORDER — METFORMIN HCL 500 MG PO TABS: 500.0000 mg | ORAL_TABLET | Freq: Every day | ORAL | Status: DC

## 2015-11-11 NOTE — Patient Instructions (Addendum)
TAKE HUMALOG 8 UNITS BEFORE EACH MEAL. TAKE LANTUS 60 UNITS ONCE A DAY. TAKE METFORMIN 500 MG ONCE A DAY.  FOR YOUR BACK PAIN, WE HAVE INCREASED THE GABAPENTIN TO 600 MG THREE TIMES A DAY. TAKE NAPROXEN AS NEEDED FOR PAIN. WE NEED TO OBTAIN A IMAGING OF YOUR BACK TO FURTHER LOOK FOR CAUSES OF YOUR BACK PAIN.   FOLLOW UP IN ONE MONTH.

## 2015-11-11 NOTE — Progress Notes (Signed)
Subjective:    Patient ID: [name], female    DOB: Jul 19, 1951, 65 y.o.   MRN: [id number]  HPI [name] is a 65 y.o. female with PMHx of Chronic Combined CHF, T2DM, HTN, NICM, COPD, and Depression who presents to the clinic for follow up for her COPD, T2DM and HTN. Please see A&P for the status of the patient's chronic medical problems.   Past Medical History  Diagnosis Date  . Hyperthyroidism, subclinical   . Post menopausal syndrome   . Obesity   . Cardiac defibrillator in situ     Atlas II VR (SJM) implanted by Dr Lovena Le  . Eczema   . Lipoma   . Chronic ulcer of leg ([place])     04-09-15 resolved-not a problem.  . Hyperlipidemia   . Chronic combined systolic and diastolic heart failure (HCC)     a. EF 35-40% in past;  b. Echo 7/13:  EF 45-50%, Gr 2 diast dysfn, mild AI, mild MAC, trivial MR, mild LAE, PASP 47.  [name] NICM (nonischemic cardiomyopathy) ([place])   . Diabetes mellitus   . HTN (hypertension)   . Elevated alkaline phosphatase level     GGT and 5'nucleotidase 8/13 normal  . Hx of cardiac cath     a. [place] 2003 normal;  b. LHC 6/13:  Mild calcification in the LM, o/w normal coronary arteries, EF 45%.   . Sleep apnea     pt denies 04/12/2013  . COPD (chronic obstructive pulmonary disease) (HCC)     O2 at night  . Automatic implantable cardioverter-defibrillator in situ   . CHF (congestive heart failure) ([place])   . Depression   . Implantable cardioverter-defibrillator (ICD) generator end of life   . Colon polyps 04/12/2013    Rectosigmoid polyp  . History of oxygen administration     oxygen @ 2 l/m nasally bedtime 24/7  . ATN (acute tubular necrosis) ([place]) 07/15/2014    Outpatient Encounter Prescriptions as of 11/11/2015  Medication Sig Note  . albuterol (PROVENTIL) (2.5 MG/3ML) 0.083% nebulizer solution USE 1 VIAL VIA NEBULIZER EVERY 6 HOURS AS NEEDED FOR WHEEZING OR SHORTNESS OF BREATH   . albuterol (VENTOLIN HFA) 108 (90 BASE) MCG/ACT inhaler Inhale 2 puffs into  the lungs every 6 (six) hours as needed. Shortness of breath   . amLODipine (NORVASC) 10 MG tablet TAKE 1 TABLET BY MOUTH DAILY   . aspirin EC 81 MG tablet Take 81 mg by mouth daily.   [name] atorvastatin (LIPITOR) 80 MG tablet Take 1 tablet (80 mg total) by mouth at bedtime.   . cyclobenzaprine (FEXMID) 7.5 MG tablet Take 1 tablet (7.5 mg total) by mouth 3 (three) times daily as needed for muscle spasms.   . furosemide (LASIX) 20 MG tablet Take 2 tablets (40 mg total) by mouth daily.   [name] gabapentin (NEURONTIN) 300 MG capsule Take 1 capsule (300 mg total) by mouth 3 (three) times daily.   [name] glucose blood (ONE TOUCH ULTRA TEST) test strip Use as instructed by doctor up to 3x daily,dx code 250.02 insulin requiring   . insulin glargine (LANTUS) 100 UNIT/ML injection Inject 60 Units into the skin daily.    . insulin lispro (HUMALOG KWIKPEN) 100 UNIT/ML KiwkPen Inject 5 units with breakfast and 5 units with dinner, hold if CBGs low or not eating and call MD (Patient taking differently: Inject 5 units with breakfast, lunch, and dinner, hold if CBGs low or not eating and call MD)   .  lisinopril (PRINIVIL,ZESTRIL) 20 MG tablet Take 2 tablets by mouth daily. 08/26/2015: Received from: External Pharmacy Received Sig: TK 1 T PO D  . metoprolol succinate (TOPROL-XL) 50 MG 24 hr tablet Take 1 tablet (50 mg total) by mouth daily.   . naproxen (NAPROSYN) 500 MG tablet Take 1 tablet (500 mg total) by mouth 2 (two) times daily with a meal.   . nicotine (NICODERM CQ - DOSED IN MG/24 HR) 7 mg/24hr patch Place 1 patch (7 mg total) onto the skin daily. Remove at bedtime if sleep disturbance occurs. (Patient not taking: Reported on 08/26/2015)   . nicotine polacrilex (NICORETTE) 2 MG gum Chew & park PRN craving ([name] pls counsel) Wks 1-4: 1 piece Q 1-2 hrs PRN Wks 5-8: 1 piece Q 2-4 hrs PRN Wks 9-12: 1 piece Q 4-8 hrs PRN (Patient not taking: Reported on 08/26/2015)   . nitroGLYCERIN (NITROSTAT) 0.4 MG SL tablet Place 0.4 mg  under the tongue every 5 (five) minutes as needed for chest pain.   [name] omeprazole (PRILOSEC) 20 MG capsule Take one tablet daily 30 minutes before breakfast   . tiotropium (SPIRIVA) 18 MCG inhalation capsule Place 1 capsule (18 mcg total) into inhaler and inhale daily.    No facility-administered encounter medications on file as of 11/11/2015.    Family History  Problem Relation Age of Onset  . Stroke Mother   . Seizures Father   . Diabetes Sister   . Asthma Maternal Aunt     aunts  . Asthma Maternal Uncle     uncles  . Heart disease Father   . Heart disease Paternal Aunt     aunts  . Heart disease Paternal Uncle     uncles  . Heart disease Maternal Aunt     aunts  . Heart disease Maternal Uncle     uncles  . Heart disease Maternal Grandfather   . Colon cancer Neg Hx   . Colon polyps Neg Hx   . Esophageal cancer Neg Hx   . Kidney disease Neg Hx   . Gallbladder disease Neg Hx     Social History   Social History  . Marital Status: Widowed    Spouse Name: Alroy Dust  . Number of Children: 3  . Years of Education: 11   Occupational History  . disabled    Social History Main Topics  . Smoking status: Light Tobacco Smoker -- 0.20 packs/day for 35 years    Types: Cigarettes  . Smokeless tobacco: Never Used     Comment: Trying to quit.  . Alcohol Use: No  . Drug Use: No  . Sexual Activity: Not on file   Other Topics Concern  . Not on file   Social History Narrative   ** Merged History Encounter **       Married   Review of Systems General: Denies fatigue, change in appetite.  Respiratory: Denies SOB, DOE.   Cardiovascular: Denies chest pain and palpitations.  Gastrointestinal: Admits to fecal incontinence. Denies nausea, vomiting, abdominal pain. Genitourinary: Admits to urinary incontinence. Denies dysuria, urgency, frequency. Musculoskeletal: Admits to low back pain, right lower extremity weakness.  Neurological: Admits to weakness and numbness in right lower  extremity. Denies numbness in vaginal or rectal area. Denies dizziness, headaches     Objective:   Physical Exam Filed Vitals:   11/11/15 1359  BP: 116/69  Pulse: 88  Temp: 98 F (36.7 C)  TempSrc: Oral  Weight: 237 lb 1.6 oz (107.548 kg)  SpO2:  100%   General: Vital signs reviewed.  Patient is obese, in no acute distress and cooperative with exam.   Cardiovascular: RRR, S1 normal, S2 normal. ICD in place. Pulmonary/Chest: Clear to auscultation bilaterally, no wheezes, rales, or rhonchi. Abdominal: Soft, non-tender, non-distended, BS + Musculoskeletal: 4/5 flexion and extension strength in left lower extremity, 3/5 on right side. Normal ambulation.  Neurological: Decreased rectal tone. Decreased sensation in right lower extremity thigh versus left side.  Skin: Warm, dry and intact.  Psychiatric: Normal mood and affect.      Assessment & Plan:   Please see problem based assessment and plan.

## 2015-11-12 ENCOUNTER — Other Ambulatory Visit: Payer: Self-pay | Admitting: Internal Medicine

## 2015-11-12 DIAGNOSIS — M5116 Intervertebral disc disorders with radiculopathy, lumbar region: Secondary | ICD-10-CM

## 2015-11-12 NOTE — Progress Notes (Signed)
Medication Samples have been provided to the patient.  Drug: Lantus (insulin glargine) Strength: 100 units/mL Qty: 1 LOT: [id number] Exp.Date: 06/2016  The patient has been instructed regarding the correct time, dose, and frequency of taking this medication, including desired effects and most common side effects.   June Vacha J 2:51 PM 11/11/2015

## 2015-11-12 NOTE — Assessment & Plan Note (Signed)
Patient denies DOE, weight gain or shortness of breath. She reports compliance with her metoprolol, ASA, lisinopril. Echo in 2015 revealed EF 35-40% and grade 1 diastolic dysfunction. Patient follows with Dr. Lovena Le. [name] working properly in November 2016.  Plan: -Continue the above medications

## 2015-11-12 NOTE — Assessment & Plan Note (Addendum)
Patient has a history of low back pain with radiation of pain into right lower extremity associated with weakness and decreased sensation. Over the past one month, pain has been worsening. She states she often stay in bed and only leaves her house to go to church which is a big effort for her. Several family members assist her by getting her into a low car and driving her to church. She has been taken Naproxen and Gabapentin without much relief. She has not been taking the Flexeril. Lumbar spine xrays in October 2016 were essential unremarkable and physical therapy has not been helpful. In the past one month, patient admits to new onset of urinary and fecal incontinence especially if she coughs. Pain is progressive. She cannot remember an inciting injury, but states pain can get up to a 10/10. She denies saddle anesthesia. On physical exam, patient has apparent increased weakness and decreased sensation in right lower extremity as compared to the left. Rectal tone is DECREASED, but she has normal sensation. Given concern for spinal cord compression, we will obtain further imaging. Patient has a St. Jude ICD and cannot receive an MRI. Given patient's obesity and body habitus it would be unlikely to have adequate finding on CT scan. Therefore, we will proceed with STAT CT Myelography.   Plan: -STAT CT Myelography to r/o cord compression -Continue Naproxen 500 mg BID WC for pain -Increase Gabapentin to 600 mg TID -Weight loss

## 2015-11-12 NOTE — Progress Notes (Signed)
Medication(s) were reviewed with the patient, including name, instructions, indication, goals of therapy, potential side effects, importance of adherence, and safe use. Of note, patient has not been taking the atorvastatin and states she ran out of insulin.  Patient requested switching to mail-order pharmacy for 90-day supplies, will collaborate with PCP on therapy changes (d/c omeprazole and cyclobenzaprine, metformin added, price check on liraglutide $8 copay for future consideration). Rx's transferred to [facility] mail order pharmacy, contacted [facility] to cancel Rx's on file.  Patient verbalized understanding by repeating back information and was advised to contact me if further medication-related questions arise. Patient was also provided an information handout.

## 2015-11-12 NOTE — Assessment & Plan Note (Signed)
BP Readings from Last 3 Encounters:  11/11/15 116/69  08/26/15 150/84  07/22/15 125/75    Lab Results  Component Value Date   NA 138 07/14/2015   K 3.9 07/14/2015   CREATININE 0.93 07/14/2015    Assessment: Blood pressure control:  Controlled Progress toward BP goal:   At goal Comments: Patient reports compliance with lisinopril 40 mg daily, metoprolol 50 mg QD, and amlodipine 10 mg daily.   Plan: Medications:  continue current medications

## 2015-11-12 NOTE — Assessment & Plan Note (Signed)
Lab Results  Component Value Date   HGBA1C 12.8 11/11/2015   HGBA1C 11.9* 06/18/2015   HGBA1C 9.6 01/14/2015     Assessment: Diabetes control:  Uncontrolled Progress toward A1C goal:   Deteriorated Comments: Patient has been compliant with Lantus 60 units QHS and Humalog 5 BID WC. She has previously been of metformin due to kidney injury in the past, but she is willing to restart it with monitoring.   Plan: Medications:  Continue Lantus 60 units QHS, Increase Humalog to 8 units TID WC, Add Metformin 500 mg daily.  Home glucose monitoring: Frequency:   Timing:   Instruction/counseling given: discussed the need for weight loss Educational resources provided:   Self management tools provided: copy of home glucose meter download Other plans: Follow up in one month for BMET

## 2015-11-12 NOTE — Assessment & Plan Note (Signed)
Patient has moderate to severe COPD based on PFTs from September 2016. She reports compliance with her Spiriva and albuterol inhalers. She reports good control in her symptoms- she is able to walk briskly without getting DOE, except on occasion. She denies wheezing.   Plan: -Continue albuterol prn and Spiriva

## 2015-11-13 MED ORDER — FUROSEMIDE 40 MG PO TABS: 40.0000 mg | ORAL_TABLET | Freq: Every day | ORAL | Status: DC

## 2015-11-13 MED ORDER — AMLODIPINE BESYLATE 10 MG PO TABS: 10.0000 mg | ORAL_TABLET | Freq: Every day | ORAL | Status: DC

## 2015-11-13 MED ORDER — TIOTROPIUM BROMIDE MONOHYDRATE 18 MCG IN CAPS: 18.0000 ug | ORAL_CAPSULE | Freq: Every day | RESPIRATORY_TRACT | Status: DC

## 2015-11-13 MED ORDER — INSULIN LISPRO 100 UNIT/ML (KWIKPEN): 8.0000 [IU] | PEN_INJECTOR | Freq: Three times a day (TID) | SUBCUTANEOUS | Status: DC

## 2015-11-13 MED ORDER — GLUCOSE BLOOD VI STRP: ORAL_STRIP | Status: DC

## 2015-11-13 MED ORDER — ATORVASTATIN CALCIUM 80 MG PO TABS: 80.0000 mg | ORAL_TABLET | Freq: Every day | ORAL | Status: DC

## 2015-11-13 MED ORDER — LISINOPRIL 40 MG PO TABS: 40.0000 mg | ORAL_TABLET | Freq: Every day | ORAL | Status: DC

## 2015-11-13 MED ORDER — METFORMIN HCL 500 MG PO TABS: 500.0000 mg | ORAL_TABLET | Freq: Every day | ORAL | Status: DC

## 2015-11-13 MED ORDER — ASPIRIN EC 81 MG PO TBEC: 81.0000 mg | DELAYED_RELEASE_TABLET | Freq: Every day | ORAL | Status: DC

## 2015-11-13 MED ORDER — GABAPENTIN 300 MG PO CAPS: 600.0000 mg | ORAL_CAPSULE | Freq: Three times a day (TID) | ORAL | Status: DC

## 2015-11-13 MED ORDER — ALBUTEROL SULFATE (2.5 MG/3ML) 0.083% IN NEBU: INHALATION_SOLUTION | RESPIRATORY_TRACT | Status: DC

## 2015-11-13 MED ORDER — METOPROLOL SUCCINATE ER 50 MG PO TB24: 50.0000 mg | ORAL_TABLET | Freq: Every day | ORAL | Status: DC

## 2015-11-13 MED ORDER — INSULIN DETEMIR 100 UNIT/ML FLEXPEN: 60.0000 [IU] | PEN_INJECTOR | Freq: Every day | SUBCUTANEOUS | Status: DC

## 2015-11-13 MED ORDER — ALBUTEROL SULFATE HFA 108 (90 BASE) MCG/ACT IN AERS: 2.0000 | INHALATION_SPRAY | Freq: Four times a day (QID) | RESPIRATORY_TRACT | Status: DC | PRN

## 2015-11-17 NOTE — Progress Notes (Signed)
Internal Medicine Clinic Attending  Case discussed with Dr. Richardson at the time of the visit.  We reviewed the resident's history and exam and pertinent patient test results.  I agree with the assessment, diagnosis, and plan of care documented in the resident's note. 

## 2015-11-20 ENCOUNTER — Other Ambulatory Visit: Payer: Self-pay

## 2015-11-21 MED ORDER — INSULIN GLARGINE 100 UNIT/ML ~~LOC~~ SOLN: 60.0000 [IU] | Freq: Every day | SUBCUTANEOUS | Status: DC

## 2015-11-24 ENCOUNTER — Ambulatory Visit
Admission: RE | Admit: 2015-11-24 | Discharge: 2015-11-24 | Disposition: A | Discharge status: Home / Self Care | Payer: Medicare Other | Source: Ambulatory Visit | Attending: Internal Medicine | Admitting: Internal Medicine

## 2015-11-24 VITALS — BP 193/104 | HR 88

## 2015-11-24 DIAGNOSIS — M5116 Intervertebral disc disorders with radiculopathy, lumbar region: Secondary | ICD-10-CM

## 2015-11-24 DIAGNOSIS — M4806 Spinal stenosis, lumbar region: Secondary | ICD-10-CM | POA: Diagnosis not present

## 2015-11-24 DIAGNOSIS — K6289 Other specified diseases of anus and rectum: Secondary | ICD-10-CM

## 2015-11-24 MED ORDER — ONDANSETRON HCL 4 MG/2ML IJ SOLN
4.0000 mg | Freq: Once | INTRAMUSCULAR | Status: AC
  Administered 2015-11-24: 4 mg via INTRAMUSCULAR

## 2015-11-24 MED ORDER — MEPERIDINE HCL 100 MG/ML IJ SOLN
75.0000 mg | Freq: Once | INTRAMUSCULAR | Status: AC
  Administered 2015-11-24: 75 mg via INTRAMUSCULAR

## 2015-11-24 MED ORDER — IOHEXOL 180 MG/ML  SOLN
15.0000 mL | Freq: Once | INTRAMUSCULAR | Status: AC | PRN
  Administered 2015-11-24: 15 mL via INTRATHECAL

## 2015-11-24 MED ORDER — DIAZEPAM 5 MG PO TABS
10.0000 mg | ORAL_TABLET | Freq: Once | ORAL | Status: AC
  Administered 2015-11-24: 10 mg via ORAL

## 2015-11-24 NOTE — Discharge Instructions (Signed)

## 2015-11-25 ENCOUNTER — Ambulatory Visit: Payer: Medicare Other | Admitting: *Deleted

## 2015-11-25 DIAGNOSIS — I428 Other cardiomyopathies: Secondary | ICD-10-CM

## 2015-11-25 DIAGNOSIS — I429 Cardiomyopathy, unspecified: Secondary | ICD-10-CM | POA: Diagnosis not present

## 2015-11-25 NOTE — Progress Notes (Signed)
Remote ICD transmission.   

## 2015-11-30 DIAGNOSIS — J449 Chronic obstructive pulmonary disease, unspecified: Secondary | ICD-10-CM | POA: Diagnosis not present

## 2015-12-07 MED ORDER — AMLODIPINE-OLMESARTAN 10-40 MG PO TABS: 1.0000 | ORAL_TABLET | Freq: Every day | ORAL | Status: DC

## 2015-12-07 MED ORDER — METFORMIN HCL ER 500 MG PO TB24: 500.0000 mg | ORAL_TABLET | Freq: Every day | ORAL | Status: DC

## 2015-12-07 NOTE — Addendum Note (Signed)
Addended by: Forde Dandy on: 12/07/2015 06:24 PM   Modules accepted: Orders, Medications

## 2015-12-09 ENCOUNTER — Encounter: Payer: Self-pay | Admitting: Internal Medicine

## 2015-12-09 ENCOUNTER — Ambulatory Visit (INDEPENDENT_AMBULATORY_CARE_PROVIDER_SITE_OTHER): Payer: Medicare Other | Admitting: Pharmacist

## 2015-12-09 ENCOUNTER — Ambulatory Visit (INDEPENDENT_AMBULATORY_CARE_PROVIDER_SITE_OTHER): Payer: Medicare Other | Admitting: Internal Medicine

## 2015-12-09 VITALS — BP 126/90 | HR 88 | Temp 97.7°F | Ht 60.0 in | Wt 235.2 lb

## 2015-12-09 DIAGNOSIS — E1122 Type 2 diabetes mellitus with diabetic chronic kidney disease: Secondary | ICD-10-CM | POA: Diagnosis not present

## 2015-12-09 DIAGNOSIS — I129 Hypertensive chronic kidney disease with stage 1 through stage 4 chronic kidney disease, or unspecified chronic kidney disease: Secondary | ICD-10-CM | POA: Diagnosis not present

## 2015-12-09 DIAGNOSIS — M48061 Spinal stenosis, lumbar region without neurogenic claudication: Secondary | ICD-10-CM

## 2015-12-09 DIAGNOSIS — Z719 Counseling, unspecified: Secondary | ICD-10-CM

## 2015-12-09 DIAGNOSIS — F17210 Nicotine dependence, cigarettes, uncomplicated: Secondary | ICD-10-CM

## 2015-12-09 DIAGNOSIS — Z7189 Other specified counseling: Secondary | ICD-10-CM

## 2015-12-09 DIAGNOSIS — M4806 Spinal stenosis, lumbar region: Secondary | ICD-10-CM

## 2015-12-09 DIAGNOSIS — E1165 Type 2 diabetes mellitus with hyperglycemia: Secondary | ICD-10-CM

## 2015-12-09 DIAGNOSIS — N182 Chronic kidney disease, stage 2 (mild): Secondary | ICD-10-CM | POA: Diagnosis not present

## 2015-12-09 DIAGNOSIS — I10 Essential (primary) hypertension: Secondary | ICD-10-CM

## 2015-12-09 DIAGNOSIS — Z79899 Other long term (current) drug therapy: Secondary | ICD-10-CM

## 2015-12-09 DIAGNOSIS — Z794 Long term (current) use of insulin: Secondary | ICD-10-CM

## 2015-12-09 DIAGNOSIS — Z0000 Encounter for general adult medical examination without abnormal findings: Secondary | ICD-10-CM

## 2015-12-09 LAB — GLUCOSE, CAPILLARY: Glucose-Capillary: 165 mg/dL — ABNORMAL HIGH (ref 65–99)

## 2015-12-09 MED ORDER — INSULIN LISPRO 100 UNIT/ML (KWIKPEN): 10.0000 [IU] | PEN_INJECTOR | Freq: Three times a day (TID) | SUBCUTANEOUS | Status: DC

## 2015-12-09 MED ORDER — NAPROXEN 500 MG PO TABS: 500.0000 mg | ORAL_TABLET | Freq: Two times a day (BID) | ORAL | Status: DC

## 2015-12-09 MED ORDER — METFORMIN HCL ER 500 MG PO TB24: 2000.0000 mg | ORAL_TABLET | Freq: Every day | ORAL | Status: DC

## 2015-12-09 NOTE — Progress Notes (Signed)
Subjective:    Patient ID: [name], female    DOB: [birth year], 65 y.o.   MRN: [id number]  HPI [name] is a 65 y.o. female with PMHx of HTN, moderate COPD, and T2DM who presents to the clinic for follow up for lumbar radiculopathy and T2DM. Please see A&P for the status of the patient's chronic medical problems.   Past Medical History  Diagnosis Date  . Hyperthyroidism, subclinical   . Post menopausal syndrome   . Obesity   . Cardiac defibrillator in situ     Atlas II VR (SJM) implanted by Dr Lovena Le  . Eczema   . Lipoma   . Chronic ulcer of leg ([place])     04-09-15 resolved-not a problem.  . Hyperlipidemia   . Chronic combined systolic and diastolic heart failure (HCC)     a. EF 35-40% in past;  b. Echo 7/13:  EF 45-50%, Gr 2 diast dysfn, mild AI, mild MAC, trivial MR, mild LAE, PASP 47.  [name] NICM (nonischemic cardiomyopathy) ([place])   . Diabetes mellitus   . HTN (hypertension)   . Elevated alkaline phosphatase level     GGT and 5'nucleotidase 8/13 normal  . Hx of cardiac cath     a. [place] 2003 normal;  b. [facility] 6/13:  Mild calcification in the LM, o/w normal coronary arteries, EF 45%.   . Sleep apnea     pt denies 04/12/2013  . COPD (chronic obstructive pulmonary disease) (HCC)     O2 at night  . Automatic implantable cardioverter-defibrillator in situ   . CHF (congestive heart failure) ([place])   . Depression   . Implantable cardioverter-defibrillator (ICD) generator end of life   . Colon polyps 04/12/2013    Rectosigmoid polyp  . History of oxygen administration     oxygen @ 2 l/m nasally bedtime 24/7  . ATN (acute tubular necrosis) ([place]) 07/15/2014    Outpatient Encounter Prescriptions as of 12/09/2015  Medication Sig  . albuterol (PROVENTIL HFA;VENTOLIN HFA) 108 (90 Base) MCG/ACT inhaler Inhale 2 puffs into the lungs every 6 (six) hours as needed for wheezing or shortness of breath.  [name] albuterol (PROVENTIL) (2.5 MG/3ML) 0.083% nebulizer solution USE 1 VIAL VIA NEBULIZER  EVERY 6 HOURS AS NEEDED FOR WHEEZING OR SHORTNESS OF BREATH  . amLODipine-olmesartan (AZOR) 10-40 MG tablet Take 1 tablet by mouth daily.  [name] aspirin EC 81 MG tablet Take 1 tablet (81 mg total) by mouth daily.  [name] atorvastatin (LIPITOR) 80 MG tablet Take 1 tablet (80 mg total) by mouth at bedtime.  . furosemide (LASIX) 40 MG tablet Take 1 tablet (40 mg total) by mouth daily. Place Rx on file (filled Rx on 11/11/15)  . gabapentin (NEURONTIN) 300 MG capsule Take 2 capsules (600 mg total) by mouth 3 (three) times daily. Place Rx on file (picked up Rx on 11/11/15)  . glucose blood (ONE TOUCH ULTRA TEST) test strip Use as instructed by doctor up to 3x daily,dx code 250.02 insulin requiring  . insulin glargine (LANTUS) 100 UNIT/ML injection Inject 0.6 mLs (60 Units total) into the skin daily.  . insulin lispro (HUMALOG KWIKPEN) 100 UNIT/ML KiwkPen Inject 0.1 mLs (10 Units total) into the skin 3 (three) times daily.  . metFORMIN (GLUCOPHAGE-XR) 500 MG 24 hr tablet Take 4 tablets (2,000 mg total) by mouth daily with breakfast.  . metoprolol succinate (TOPROL-XL) 50 MG 24 hr tablet Take 1 tablet (50 mg total) by mouth daily.  . naproxen (NAPROSYN) 500 MG  tablet Take 1 tablet (500 mg total) by mouth 2 (two) times daily with a meal.  . nicotine (NICODERM CQ - DOSED IN MG/24 HR) 7 mg/24hr patch Place 1 patch (7 mg total) onto the skin daily. Remove at bedtime if sleep disturbance occurs. (Patient not taking: Reported on 08/26/2015)  . nicotine polacrilex (NICORETTE) 2 MG gum Chew & park PRN craving ([name] pls counsel) Wks 1-4: 1 piece Q 1-2 hrs PRN Wks 5-8: 1 piece Q 2-4 hrs PRN Wks 9-12: 1 piece Q 4-8 hrs PRN (Patient not taking: Reported on 08/26/2015)  . nitroGLYCERIN (NITROSTAT) 0.4 MG SL tablet Place 0.4 mg under the tongue every 5 (five) minutes as needed for chest pain.  [name] tiotropium (SPIRIVA) 18 MCG inhalation capsule Place 1 capsule (18 mcg total) into inhaler and inhale daily.  . [DISCONTINUED] insulin lispro  (HUMALOG KWIKPEN) 100 UNIT/ML KiwkPen Inject 0.08 mLs (8 Units total) into the skin 3 (three) times daily.  . [DISCONTINUED] metFORMIN (GLUCOPHAGE-XR) 500 MG 24 hr tablet Take 1 tablet (500 mg total) by mouth daily with breakfast.   No facility-administered encounter medications on file as of 12/09/2015.    Family History  Problem Relation Age of Onset  . Stroke Mother   . Seizures Father   . Diabetes Sister   . Asthma Maternal Aunt     aunts  . Asthma Maternal Uncle     uncles  . Heart disease Father   . Heart disease Paternal Aunt     aunts  . Heart disease Paternal Uncle     uncles  . Heart disease Maternal Aunt     aunts  . Heart disease Maternal Uncle     uncles  . Heart disease Maternal Grandfather   . Colon cancer Neg Hx   . Colon polyps Neg Hx   . Esophageal cancer Neg Hx   . Kidney disease Neg Hx   . Gallbladder disease Neg Hx     Social History   Social History  . Marital Status: Widowed    Spouse Name: Alroy Dust  . Number of Children: 3  . Years of Education: 11   Occupational History  . disabled    Social History Main Topics  . Smoking status: Current Some Day Smoker -- 0.20 packs/day for 35 years    Types: Cigarettes  . Smokeless tobacco: Never Used     Comment: Trying to quit.  . Alcohol Use: No  . Drug Use: No  . Sexual Activity: Not on file   Other Topics Concern  . Not on file   Social History Narrative   ** Merged History Encounter **       Married   Review of Systems General: Denies fever, chills, fatigue, change in appetite Respiratory: Denies SOB, cough, DOE, chest tightness   Cardiovascular: Denies chest pain and palpitations.  Gastrointestinal: Denies nausea, vomiting, abdominal pain Endocrine: Denies polyuria, and polydipsia. Musculoskeletal: Admits to low back pain with radiation to right leg. Denies myalgias. Neurological: Denies dizziness, headaches, weakness, lightheadedness    Objective:   Physical Exam Filed Vitals:    12/09/15 1351  BP: 126/90  Pulse: 88  Temp: 97.7 F (36.5 C)  TempSrc: Oral  Height: 5' (1.524 m)  Weight: 106.686 kg (235 lb 3.2 oz)  SpO2: 94%   General: Vital signs reviewed.  Patient is obese, in no acute distress and cooperative with exam.  Cardiovascular: RRR, S1 normal, S2 normal Pulmonary/Chest: Clear to auscultation bilaterally, no wheezes, rales, or rhonchi. Abdominal:  Soft, obese, non-tender, non-distended, BS + Musculoskeletal: Pain on palpation of lumbar spin and within gluteus maximus on right, tenderness in proximal right quadricep. Decrease flexion and extension and sidebending of lumbar spine. Pain is reproducible with extension. Extremities: Trace lower extremity edema bilaterally in feet Neurological: A&O x3, Strength is normal and symmetric bilaterally in lower extremities, sensory intact to light touch bilaterally.  Skin: Warm, dry and intact.  Psychiatric: Normal mood and affect. speech and behavior is normal. Cognition and memory are normal.      Assessment & Plan:   Please see problem based assessment and plan.

## 2015-12-09 NOTE — Patient Instructions (Signed)
FOR YOUR DIABETES: -TAKE LANTUS 60 UNITS AT NIGHT -TAKE HUMALOG 10 UNITS WITH MEALS -TAKE METFORMIN 1000 MG (2 PILLS) IN THE MORNING FOR ONE WEEK -THEN TAKE METFORMIN 1500 MG (3 PILLS) IN THE MORNING FOR ONE WEEK -THEN TAKE METFORMIN 2000 MG (4 PILLS) IN THE MORNING FROM THEN ON  FOR YOUR BACK PAIN: -CONTINUE TAKING GABAPENTIN 600 MG THREE TIMES A DAY -TAKE NAPROXEN 500 MG TWICE A DAY WITH FOOD -WE WILL TRIAL A MORE INTENSE PHYSICAL THERAPY REGIMEN  Sciatica Sciatica is pain, weakness, numbness, or tingling along the path of the sciatic nerve. The nerve starts in the lower back and runs down the back of each leg. The nerve controls the muscles in the lower leg and in the back of the knee, while also providing sensation to the back of the thigh, lower leg, and the sole of your foot. Sciatica is a symptom of another medical condition. For instance, nerve damage or certain conditions, such as a herniated disk or bone spur on the spine, pinch or put pressure on the sciatic nerve. This causes the pain, weakness, or other sensations normally associated with sciatica. Generally, sciatica only affects one side of the body. CAUSES   Herniated or slipped disc.  Degenerative disk disease.  A pain disorder involving the narrow muscle in the buttocks (piriformis syndrome).  Pelvic injury or fracture.  Pregnancy.  Tumor (rare). SYMPTOMS  Symptoms can vary from mild to very severe. The symptoms usually travel from the low back to the buttocks and down the back of the leg. Symptoms can include:  Mild tingling or dull aches in the lower back, leg, or hip.  Numbness in the back of the calf or sole of the foot.  Burning sensations in the lower back, leg, or hip.  Sharp pains in the lower back, leg, or hip.  Leg weakness.  Severe back pain inhibiting movement. These symptoms may get worse with coughing, sneezing, laughing, or prolonged sitting or standing. Also, being overweight may worsen  symptoms. DIAGNOSIS  Your caregiver will perform a physical exam to look for common symptoms of sciatica. He or she may ask you to do certain movements or activities that would trigger sciatic nerve pain. Other tests may be performed to find the cause of the sciatica. These may include:  Blood tests.  X-rays.  Imaging tests, such as an MRI or CT scan. TREATMENT  Treatment is directed at the cause of the sciatic pain. Sometimes, treatment is not necessary and the pain and discomfort goes away on its own. If treatment is needed, your caregiver may suggest:  Over-the-counter medicines to relieve pain.  Prescription medicines, such as anti-inflammatory medicine, muscle relaxants, or narcotics.  Applying heat or ice to the painful area.  Steroid injections to lessen pain, irritation, and inflammation around the nerve.  Reducing activity during periods of pain.  Exercising and stretching to strengthen your abdomen and improve flexibility of your spine. Your caregiver may suggest losing weight if the extra weight makes the back pain worse.  Physical therapy.  Surgery to eliminate what is pressing or pinching the nerve, such as a bone spur or part of a herniated disk. HOME CARE INSTRUCTIONS   Only take over-the-counter or prescription medicines for pain or discomfort as directed by your caregiver.  Apply ice to the affected area for 20 minutes, 3-4 times a day for the first 48-72 hours. Then try heat in the same way.  Exercise, stretch, or perform your usual activities if these  do not aggravate your pain.  Attend physical therapy sessions as directed by your caregiver.  Keep all follow-up appointments as directed by your caregiver.  Do not wear high heels or shoes that do not provide proper support.  Check your mattress to see if it is too soft. A firm mattress may lessen your pain and discomfort. SEEK IMMEDIATE MEDICAL CARE IF:   You lose control of your bowel or bladder  (incontinence).  You have increasing weakness in the lower back, pelvis, buttocks, or legs.  You have redness or swelling of your back.  You have a burning sensation when you urinate.  You have pain that gets worse when you lie down or awakens you at night.  Your pain is worse than you have experienced in the past.  Your pain is lasting longer than 4 weeks.  You are suddenly losing weight without reason. MAKE SURE YOU:  Understand these instructions.  Will watch your condition.  Will get help right away if you are not doing well or get worse.   This information is not intended to replace advice given to you by your health care provider. Make sure you discuss any questions you have with your health care provider.   Document Released: 09/13/2001 Document Revised: 06/10/2015 Document Reviewed: 01/29/2012 Elsevier Interactive Patient Education [employer].

## 2015-12-10 NOTE — Assessment & Plan Note (Signed)
BP Readings from Last 3 Encounters:  12/09/15 126/90  11/24/15 193/104  11/11/15 116/69    Lab Results  Component Value Date   NA 138 07/14/2015   K 3.9 07/14/2015   CREATININE 0.93 07/14/2015    Assessment: Blood pressure control:  Well controlled Progress toward BP goal:   At goal Comments: Compliant with lisinopril 40 mg daily, metoprolol XL 50 mg daily, amlodipine 10 mg daily.   Plan: Medications:  continue current medications

## 2015-12-10 NOTE — Assessment & Plan Note (Signed)
For the past 5 months patient has developed worsening low back pain with radiation of pain into her right hip and right proximal anterior thigh. Pain is worse with standing and with ambulation. Gabapentin has had minimal effect. She often lays in bed and will skip meals due to pain. CT myelogram was performed one month ago given new onset fecal incontinence and decreased rectal tone on examination. Study showed no evidence of cord compression but did revealed mild spinal stenosis, moderate bilateral lateral recess stenosis oat L4-L5, and right mild foraminal stenosis at L3-L4.   Plan: -Weight loss -Continue gabapentin 600 mg TID -Schedule Naproxen 500 mg BID WC -Referral to physical therapy -If pain persists, could consider low dose tramadol for neuropathic effect

## 2015-12-10 NOTE — Assessment & Plan Note (Signed)
Lab Results  Component Value Date   HGBA1C 12.8 11/11/2015   HGBA1C 11.9* 06/18/2015   HGBA1C 9.6 01/14/2015     Assessment: Diabetes control:  Uncontrolled Progress toward A1C goal:   Improved Comments: Patient reports compliance with Metoprolol 500 mg daily, Lantus 60 units QHS, and Humalog 8 mg TID.   Plan: Medications:  Titrate Metformin up to 1000 mg BID, increase Humalog to 10 mg TID WC, continue Lantus 60 units QHS. Home glucose monitoring: Frequency:  TID Timing:  ACHS Instruction/counseling given: discussed the need for weight loss and discussed diet Educational resources provided:   Self management tools provided: copy of home glucose meter download Other plans: Follow up in 2 months

## 2015-12-10 NOTE — Assessment & Plan Note (Signed)
  [name] is a 65 y.o. Current smoker (1 ppd for 40 years) with approximately a 40 pack year history. Therefore, [name] does meet the USPTF recommendations for lung cancer screening with low dose CT in persons age 60 - 71 yrs with a 30 pack year history who are currently smoking or have quit in the past 15 yrs. We have discussed the risks and benefits of screening, including but not limited to the following. Lung cancer screening might detect about one half of lung cancer at an early, curative stage. For the average screened person, the risk of dying from lung cancer will decrease from 1.7% to 1.4% with screening. About 1 in 4 screened patients would have a false positive result leading to more radiation exposure, cost, anxiety, and / or invasive procedures. 95% of false positives will not lead to a diagnosis of cancer. After discussion, [name] has decided to pursue lung cancer screening, but not at the current time.   Plan: -Rediscuss lung cancer screening at follow up visit -Use [email or web address] to further discuss indications

## 2015-12-11 ENCOUNTER — Encounter: Payer: Self-pay | Admitting: Pharmacist

## 2015-12-11 ENCOUNTER — Other Ambulatory Visit: Payer: Self-pay | Admitting: *Deleted

## 2015-12-11 NOTE — Patient Instructions (Signed)
Patient educated about medication as defined in this encounter and verbalized understanding by repeating back instructions provided.   

## 2015-12-11 NOTE — Progress Notes (Signed)
Medication(s) were reviewed with the patient, including name, instructions, indication, goals of therapy, potential side effects, importance of adherence, and safe use. Patient did not bring all medications with her to the visit. Recommend titrating insulin for further A1C control, consider adding GLP1RA in the future. Collaboration with PCP. Patient advised to bring all medications to follow up visits.  Patient verbalized understanding by repeating back information and was advised to contact me if further medication-related questions arise. Patient was also provided an information handout.

## 2015-12-11 NOTE — Progress Notes (Signed)
Internal Medicine Clinic Attending  Case discussed with Dr. Richardson at the time of the visit.  We reviewed the resident's history and exam and pertinent patient test results.  I agree with the assessment, diagnosis, and plan of care documented in the resident's note. 

## 2015-12-12 MED ORDER — "INSULIN SYRINGE-NEEDLE U-100 31G X 5/16"" 1 ML MISC": Status: DC

## 2015-12-15 DIAGNOSIS — Z0100 Encounter for examination of eyes and vision without abnormal findings: Secondary | ICD-10-CM | POA: Diagnosis not present

## 2015-12-15 DIAGNOSIS — E113293 Type 2 diabetes mellitus with mild nonproliferative diabetic retinopathy without macular edema, bilateral: Secondary | ICD-10-CM | POA: Diagnosis not present

## 2015-12-15 LAB — HM DIABETES EYE EXAM

## 2015-12-17 ENCOUNTER — Telehealth: Payer: Self-pay | Admitting: Internal Medicine

## 2015-12-17 NOTE — Telephone Encounter (Signed)
Calling to get clarification about medication possible duplication. [facility] Order [id number]

## 2015-12-17 NOTE — Telephone Encounter (Signed)
Called, they have their answer

## 2015-12-18 ENCOUNTER — Encounter: Payer: Self-pay | Admitting: *Deleted

## 2015-12-23 ENCOUNTER — Encounter: Payer: Self-pay | Admitting: Cardiology

## 2015-12-23 ENCOUNTER — Ambulatory Visit: Payer: Medicare Other | Attending: Student in an Organized Health Care Education/Training Program

## 2015-12-23 NOTE — Progress Notes (Signed)
Patients remote transmission was not received.

## 2015-12-24 NOTE — Addendum Note (Signed)
Addended by: Hulan Fray on: 12/24/2015 04:31 PM   Modules accepted: Orders

## 2015-12-28 DIAGNOSIS — J449 Chronic obstructive pulmonary disease, unspecified: Secondary | ICD-10-CM | POA: Diagnosis not present

## 2016-01-03 ENCOUNTER — Encounter (HOSPITAL_COMMUNITY): Payer: Self-pay | Admitting: Emergency Medicine

## 2016-01-03 ENCOUNTER — Emergency Department (HOSPITAL_COMMUNITY): Payer: Medicare Other

## 2016-01-03 ENCOUNTER — Emergency Department (HOSPITAL_COMMUNITY)
Admission: EM | Admit: 2016-01-03 | Discharge: 2016-01-03 | Disposition: A | Discharge status: Home / Self Care | Payer: Medicare Other | Attending: Emergency Medicine | Admitting: Emergency Medicine

## 2016-01-03 DIAGNOSIS — Z794 Long term (current) use of insulin: Secondary | ICD-10-CM | POA: Diagnosis not present

## 2016-01-03 DIAGNOSIS — E669 Obesity, unspecified: Secondary | ICD-10-CM | POA: Diagnosis not present

## 2016-01-03 DIAGNOSIS — I5042 Chronic combined systolic (congestive) and diastolic (congestive) heart failure: Secondary | ICD-10-CM | POA: Diagnosis not present

## 2016-01-03 DIAGNOSIS — Z86010 Personal history of colonic polyps: Secondary | ICD-10-CM | POA: Diagnosis not present

## 2016-01-03 DIAGNOSIS — F17210 Nicotine dependence, cigarettes, uncomplicated: Secondary | ICD-10-CM | POA: Diagnosis not present

## 2016-01-03 DIAGNOSIS — Z79899 Other long term (current) drug therapy: Secondary | ICD-10-CM | POA: Diagnosis not present

## 2016-01-03 DIAGNOSIS — F329 Major depressive disorder, single episode, unspecified: Secondary | ICD-10-CM | POA: Insufficient documentation

## 2016-01-03 DIAGNOSIS — E119 Type 2 diabetes mellitus without complications: Secondary | ICD-10-CM | POA: Insufficient documentation

## 2016-01-03 DIAGNOSIS — J441 Chronic obstructive pulmonary disease with (acute) exacerbation: Secondary | ICD-10-CM | POA: Diagnosis not present

## 2016-01-03 DIAGNOSIS — Z7984 Long term (current) use of oral hypoglycemic drugs: Secondary | ICD-10-CM | POA: Diagnosis not present

## 2016-01-03 DIAGNOSIS — R0602 Shortness of breath: Secondary | ICD-10-CM | POA: Diagnosis not present

## 2016-01-03 DIAGNOSIS — Z7982 Long term (current) use of aspirin: Secondary | ICD-10-CM | POA: Diagnosis not present

## 2016-01-03 DIAGNOSIS — R069 Unspecified abnormalities of breathing: Secondary | ICD-10-CM | POA: Diagnosis not present

## 2016-01-03 DIAGNOSIS — I10 Essential (primary) hypertension: Secondary | ICD-10-CM | POA: Insufficient documentation

## 2016-01-03 LAB — I-STAT TROPONIN, ED: Troponin i, poc: 0.02 ng/mL (ref 0.00–0.08)

## 2016-01-03 LAB — CBC
HCT: 43.3 % (ref 36.0–46.0)
Hemoglobin: 13.7 g/dL (ref 12.0–15.0)
MCH: 26.3 pg (ref 26.0–34.0)
MCHC: 31.6 g/dL (ref 30.0–36.0)
MCV: 83.1 fL (ref 78.0–100.0)
Platelets: 220 10*3/uL (ref 150–400)
RBC: 5.21 MIL/uL — ABNORMAL HIGH (ref 3.87–5.11)
RDW: 14.9 % (ref 11.5–15.5)
WBC: 11 10*3/uL — ABNORMAL HIGH (ref 4.0–10.5)

## 2016-01-03 LAB — BASIC METABOLIC PANEL
Anion gap: 10 (ref 5–15)
BUN: 13 mg/dL (ref 6–20)
CO2: 29 mmol/L (ref 22–32)
Calcium: 9.5 mg/dL (ref 8.9–10.3)
Chloride: 100 mmol/L — ABNORMAL LOW (ref 101–111)
Creatinine, Ser: 0.87 mg/dL (ref 0.44–1.00)
GFR calc Af Amer: >60 mL/min (ref >60)
GFR calc non Af Amer: >60 mL/min (ref >60)
Glucose, Bld: 329 mg/dL — ABNORMAL HIGH (ref 65–99)
Potassium: 4.2 mmol/L (ref 3.5–5.1)
Sodium: 139 mmol/L (ref 135–145)

## 2016-01-03 LAB — BRAIN NATRIURETIC PEPTIDE: B Natriuretic Peptide: 35.5 pg/mL (ref 0.0–100.0)

## 2016-01-03 MED ORDER — IPRATROPIUM BROMIDE 0.02 % IN SOLN
0.5000 mg | Freq: Once | RESPIRATORY_TRACT | Status: AC
  Administered 2016-01-03: 0.5 mg via RESPIRATORY_TRACT
  Filled 2016-01-03: qty 2.5

## 2016-01-03 MED ORDER — PREDNISONE 10 MG (21) PO TBPK: 10.0000 mg | ORAL_TABLET | Freq: Every day | ORAL | Status: DC

## 2016-01-03 MED ORDER — METHYLPREDNISOLONE SODIUM SUCC 125 MG IJ SOLR
125.0000 mg | Freq: Once | INTRAMUSCULAR | Status: AC
  Administered 2016-01-03: 125 mg via INTRAVENOUS
  Filled 2016-01-03: qty 2

## 2016-01-03 MED ORDER — ALBUTEROL SULFATE (2.5 MG/3ML) 0.083% IN NEBU
5.0000 mg | INHALATION_SOLUTION | Freq: Once | RESPIRATORY_TRACT | Status: AC
  Administered 2016-01-03: 5 mg via RESPIRATORY_TRACT
  Filled 2016-01-03: qty 6

## 2016-01-03 NOTE — Discharge Instructions (Signed)
Return to the ED with any concerns including difficulty breathing despite using albuterol every 4 hours, not drinking fluids, decreased urine output, vomiting and not able to keep down liquids or medications, decreased level of alertness/lethargy, or any other alarming symptoms °

## 2016-01-03 NOTE — ED Notes (Signed)
Patient transported to X-ray 

## 2016-01-03 NOTE — ED Provider Notes (Signed)
CSN: [id number]     Arrival date & time 01/03/16  0422 History   First MD Initiated Contact with Patient 01/03/16 [phone]     Chief Complaint  Patient presents with  . Shortness of Breath     (Consider location/radiation/quality/duration/timing/severity/associated sxs/prior Treatment) HPI  Pt with hx of COPD, NICM, DM, HTN presenting with c/o shortness of breath and wheezing.  She states she began feeling that she was developing a cold 4 days ago, she has had some cough, productive of clear sputum.  No fever/chills.  No chest pain.  No change in baseline leg swelling.  She uses O2 2-3 L [place] at all times at home.  She states she has been trying her nebs and inhalers at home with little relief.  Shortness of breath worsened this morning which prompted her to call EMS.  Given duoneb per EMS- she states this did help her symptoms somewhat.  No sick contacts.  There are no other associated systemic symptoms, there are no other alleviating or modifying factors.   Past Medical History  Diagnosis Date  . Hyperthyroidism, subclinical   . Post menopausal syndrome   . Obesity   . Cardiac defibrillator in situ     Atlas II VR (SJM) implanted by Dr Lovena Le  . Eczema   . Lipoma   . Chronic ulcer of leg ([place])     04-09-15 resolved-not a problem.  . Hyperlipidemia   . Chronic combined systolic and diastolic heart failure (HCC)     a. EF 35-40% in past;  b. Echo 7/13:  EF 45-50%, Gr 2 diast dysfn, mild AI, mild MAC, trivial MR, mild LAE, PASP 47.  [name] NICM (nonischemic cardiomyopathy) ([place])   . Diabetes mellitus   . HTN (hypertension)   . Elevated alkaline phosphatase level     GGT and 5'nucleotidase 8/13 normal  . Hx of cardiac cath     a. [place] 2003 normal;  b. [facility] 6/13:  Mild calcification in the LM, o/w normal coronary arteries, EF 45%.   . Sleep apnea     pt denies 04/12/2013  . COPD (chronic obstructive pulmonary disease) (HCC)     O2 at night  . Automatic implantable cardioverter-defibrillator in situ    . CHF (congestive heart failure) ([place])   . Depression   . Implantable cardioverter-defibrillator (ICD) generator end of life   . Colon polyps 04/12/2013    Rectosigmoid polyp  . History of oxygen administration     oxygen @ 2 l/m nasally bedtime 24/7  . ATN (acute tubular necrosis) ([place]) 07/15/2014   Past Surgical History  Procedure Laterality Date  . Cardiac defibrillator placement  05/04/2007    SJM Atlas II VR ICD  . Hysteroscopy    . Cardiac defibrillator placement    . Abdominal hysterectomy    . Cardiac catheterization    . Insert / replace / remove pacemaker    . Tubal ligation    . Hernia repair    . Colonoscopy N/A 04/12/2013    Procedure: COLONOSCOPY;  Surgeon: Beryle Beams, MD;  Location: [facility] ENDOSCOPY;  Service: Endoscopy;  Laterality: N/A;  pt.has defibrilator  . Left heart catheterization with coronary angiogram N/A 04/02/2012    Procedure: LEFT HEART CATHETERIZATION WITH CORONARY ANGIOGRAM;  Surgeon: Hillary Bow, MD;  Location: [facility] CATH LAB;  Service: Cardiovascular;  Laterality: N/A;  . Implantable cardioverter defibrillator (icd) generator change N/A 04/02/2014    Procedure: ICD GENERATOR CHANGE;  Surgeon: Evans Lance, MD;  Location: [place] [facility] [place] CATH LAB;  Service: Cardiovascular;  Laterality: N/A;  . Esophagogastroduodenoscopy N/A 04/16/2015    Procedure: ESOPHAGOGASTRODUODENOSCOPY (EGD);  Surgeon: [name], MD;  Location: [facility]OSCOPY;  Service: Endoscopy;  Laterality: N/A;  . Colonoscopy N/A 04/16/2015    Procedure: COLONOSCOPY;  Surgeon: [name], MD;  Location: [facility] ENDOSCOPY;  Service: Endoscopy;  Laterality: N/A;   Family History  Problem Relation Age of Onset  . Stroke Mother   . Seizures Father   . Diabetes Sister   . Asthma Maternal Aunt     aunts  . Asthma Maternal Uncle     uncles  . Heart disease Father   . Heart disease Paternal Aunt     aunts  . Heart disease Paternal Uncle     uncles  . Heart disease Maternal Aunt     aunts  .  Heart disease Maternal Uncle     uncles  . Heart disease Maternal Grandfather   . Colon cancer Neg Hx   . Colon polyps Neg Hx   . Esophageal cancer Neg Hx   . Kidney disease Neg Hx   . Gallbladder disease Neg Hx    Social History  Substance Use Topics  . Smoking status: Current Some Day Smoker -- 0.20 packs/day for 35 years    Types: Cigarettes  . Smokeless tobacco: Never Used     Comment: Trying to quit.  . Alcohol Use: No   OB History    No data available     Review of Systems  ROS reviewed and all otherwise negative except for mentioned in HPI    Allergies  Actos; Avandia; and Hydrocortisone  Home Medications   Prior to Admission medications   Medication Sig Start Date End Date Taking? Authorizing Provider  albuterol (PROVENTIL HFA;VENTOLIN HFA) 108 (90 Base) MCG/ACT inhaler Inhale 2 puffs into the lungs every 6 (six) hours as needed for wheezing or shortness of breath. 11/13/15  Yes Alexa Angela Burke, MD  albuterol (PROVENTIL) (2.5 MG/3ML) 0.083% nebulizer solution USE 1 VIAL VIA NEBULIZER EVERY 6 HOURS AS NEEDED FOR WHEEZING OR SHORTNESS OF BREATH 11/13/15  Yes Alexa Angela Burke, MD  amLODipine-olmesartan (AZOR) 10-40 MG tablet Take 1 tablet by mouth daily. 12/07/15  Yes Alexa Angela Burke, MD  aspirin EC 81 MG tablet Take 1 tablet (81 mg total) by mouth daily. 11/13/15  Yes Alexa Angela Burke, MD  atorvastatin (LIPITOR) 80 MG tablet Take 1 tablet (80 mg total) by mouth at bedtime. 11/13/15  Yes Alexa Angela Burke, MD  furosemide (LASIX) 40 MG tablet Take 1 tablet (40 mg total) by mouth daily. Place Rx on file (filled Rx on 11/11/15) 11/13/15  Yes Alexa Angela Burke, MD  gabapentin (NEURONTIN) 300 MG capsule Take 2 capsules (600 mg total) by mouth 3 (three) times daily. Place Rx on file (picked up Rx on 11/11/15) 11/13/15  Yes Alexa Angela Burke, MD  glucose blood (ONE TOUCH ULTRA TEST) test strip Use as instructed by doctor up to 3x daily,dx code 250.02 insulin requiring 11/13/15  Yes Alexa R Burns, MD  insulin  glargine (LANTUS) 100 UNIT/ML injection Inject 0.6 mLs (60 Units total) into the skin daily. 11/21/15  Yes Alexa Angela Burke, MD  insulin lispro (HUMALOG KWIKPEN) 100 UNIT/ML KiwkPen Inject 0.1 mLs (10 Units total) into the skin 3 (three) times daily. 12/09/15  Yes Alexa Angela Burke, MD  Insulin Syringe-Needle U-100 31G X 5/16" 1 ML MISC Use to give Lantus Insulin daily. E11.22. 12/12/15  Yes Alexa Angela Burke, MD  lisinopril (PRINIVIL,ZESTRIL) 40 MG tablet Take 40 mg by mouth daily.   Yes Historical Provider, MD  metFORMIN (GLUCOPHAGE-XR) 500 MG 24 hr tablet Take 4 tablets (2,000 mg total) by mouth daily with breakfast. 12/09/15  Yes Alexa Angela Burke, MD  metoprolol succinate (TOPROL-XL) 50 MG 24 hr tablet Take 1 tablet (50 mg total) by mouth daily. 11/13/15  Yes Alexa Angela Burke, MD  naproxen (NAPROSYN) 500 MG tablet Take 1 tablet (500 mg total) by mouth 2 (two) times daily with a meal. Patient taking differently: Take 500 mg by mouth 2 (two) times daily as needed for mild pain.  12/09/15  Yes Alexa Angela Burke, MD  nitroGLYCERIN (NITROSTAT) 0.4 MG SL tablet Place 0.4 mg under the tongue every 5 (five) minutes as needed for chest pain.   Yes Historical Provider, MD  tiotropium (SPIRIVA) 18 MCG inhalation capsule Place 1 capsule (18 mcg total) into inhaler and inhale daily. 11/13/15  Yes Alexa Angela Burke, MD  predniSONE (STERAPRED UNI-PAK 21 TAB) 10 MG (21) TBPK tablet Take 1 tablet (10 mg total) by mouth daily. Take 6 tabs by mouth daily  for 2 days, then 5 tabs for 2 days, then 4 tabs for 2 days, then 3 tabs for 2 days, 2 tabs for 2 days, then 1 tab by mouth daily for 2 days 01/03/16   Alfonzo Beers, MD   BP 153/82 mmHg  Pulse 100  Temp(Src) 98.3 F (36.8 C) (Oral)  Resp 20  SpO2 98%  Vitals reviewed Physical Exam  Physical Examination: General appearance - alert, well appearing, and in no distress Mental status - alert, oriented to person, place, and time Eyes - no conjunctival injection no scleral icterus Mouth - mucous  membranes moist, pharynx normal without lesions Chest - BSS, bilateral expiratory wheezing, normal respiratory effort Heart - normal rate, regular rhythm, normal S1, S2, no murmurs, rubs, clicks or gallops Abdomen - soft, nontender, nondistended, no masses or organomegaly Neurological - alert, oriented, normal speech Extremities - peripheral pulses normal, no pedal edema, no clubbing or cyanosis Skin - warm and dry, no rashes  ED Course  Procedures (including critical care time) Labs Review Labs Reviewed  BASIC METABOLIC PANEL - Abnormal; Notable for the following:    Chloride 100 (*)    Glucose, Bld 329 (*)    All other components within normal limits  CBC - Abnormal; Notable for the following:    WBC 11.0 (*)    RBC 5.21 (*)    All other components within normal limits  BRAIN NATRIURETIC PEPTIDE  I-STAT TROPOININ, ED    Imaging Review No results found. I have personally reviewed and evaluated these images and lab results as part of my medical decision-making.   EKG Interpretation   Date/Time:  Sunday January 03 2016 05:36:33 EDT Ventricular Rate:  91 PR Interval:  172 QRS Duration: 101 QT Interval:  426 QTC Calculation: 524 R Axis:   71 Text Interpretation:  Sinus rhythm Probable left atrial enlargement Low  voltage, precordial leads Nonspecific T abnormalities, lateral leads  Prolonged QT interval Poor R wave progression Since previous tracing QT  interval is now prolonged Confirmed by Canary Brim  MD, Sukhman Martine [phone]) on  01/03/2016 5:53:05 AM      MDM   Final diagnoses:  COPD exacerbation ([place])    Pt presenting with co cold nad cough syptoms and then increase in her wheezing over the past several days  Pt feels improved after neb  treatments.  Pt started on steroids as well.    7:20 AM pt states she is feeling much better, very mild wheezing on recheck.  Maintaining O2 sats of 95% on 3L O2 which is her home O2 level.  Plan for discharge with steroid taper, pt states she  has plenty of albuterol for home use.    Alfonzo Beers, MD 01/06/16 [phone]

## 2016-01-03 NOTE — ED Notes (Signed)
PER [facility]: Patient to ED from home c/o SOB x 4 days. HX COPD and asthma - pt has tried home neb treatments and rescue inhaler with no relief - SOB worsened this morning, 81% RA. Pt is on 3L O2 [place] at home. Pt received duoneb, 5mg  albuterol, and 0.5mg  atrovent via EMS. EMS VS: 180/98, HR 95 NSR, CBG 265. Pt A&O x 4, respirations labored, equal at this time.

## 2016-01-03 NOTE — ED Notes (Signed)
Due to pt o2 stats pt placed on n/c o2 slowly increased until pt o2 stats is 92%. Pt is on 6lpm

## 2016-01-07 ENCOUNTER — Telehealth: Payer: Self-pay | Admitting: Internal Medicine

## 2016-01-07 NOTE — Telephone Encounter (Signed)
LMOVM requesting call back.  Gave device clinic phone number for return call. 

## 2016-01-07 NOTE — Telephone Encounter (Signed)
New Message  Pt requested to speak w/ Device- concerning remote monitoring. Please call back and discuss.

## 2016-01-11 NOTE — Telephone Encounter (Signed)
Second attempt:  LMOVM requesting call back if patient still has questions or concerns.  Gave device clinic phone number for return call.

## 2016-01-15 NOTE — Telephone Encounter (Signed)
Third attempt:  LMOVM requesting call back.  Gave device clinic phone number for return call if questions.  Encounter closed.

## 2016-01-27 ENCOUNTER — Other Ambulatory Visit: Payer: Self-pay | Admitting: Internal Medicine

## 2016-01-28 DIAGNOSIS — J449 Chronic obstructive pulmonary disease, unspecified: Secondary | ICD-10-CM | POA: Diagnosis not present

## 2016-01-29 ENCOUNTER — Telehealth: Payer: Self-pay | Admitting: Cardiology

## 2016-01-29 NOTE — Telephone Encounter (Signed)
Spoke and she informed me that she doesn't have a land line phone anymore. I explained to pt that we could have a cellular adapter. Explained to pt that she may or may not have it by 02-24-2016 and that if she doesn't to give [place] a call so we can reschedule her remote transmission. Pt verbalized understanding.

## 2016-02-05 ENCOUNTER — Emergency Department (HOSPITAL_COMMUNITY): Payer: Medicare Other

## 2016-02-05 ENCOUNTER — Emergency Department (HOSPITAL_COMMUNITY)
Admission: EM | Admit: 2016-02-05 | Discharge: 2016-02-05 | Disposition: A | Discharge status: Home / Self Care | Payer: Medicare Other | Attending: Emergency Medicine | Admitting: Emergency Medicine

## 2016-02-05 ENCOUNTER — Encounter (HOSPITAL_COMMUNITY): Payer: Self-pay | Admitting: Emergency Medicine

## 2016-02-05 ENCOUNTER — Telehealth: Payer: Self-pay | Admitting: Cardiology

## 2016-02-05 DIAGNOSIS — R4781 Slurred speech: Secondary | ICD-10-CM | POA: Diagnosis not present

## 2016-02-05 DIAGNOSIS — Z7982 Long term (current) use of aspirin: Secondary | ICD-10-CM | POA: Insufficient documentation

## 2016-02-05 DIAGNOSIS — Z7984 Long term (current) use of oral hypoglycemic drugs: Secondary | ICD-10-CM | POA: Diagnosis not present

## 2016-02-05 DIAGNOSIS — Z90710 Acquired absence of both cervix and uterus: Secondary | ICD-10-CM | POA: Diagnosis not present

## 2016-02-05 DIAGNOSIS — M545 Low back pain: Secondary | ICD-10-CM | POA: Diagnosis not present

## 2016-02-05 DIAGNOSIS — Z794 Long term (current) use of insulin: Secondary | ICD-10-CM | POA: Diagnosis not present

## 2016-02-05 DIAGNOSIS — Z872 Personal history of diseases of the skin and subcutaneous tissue: Secondary | ICD-10-CM | POA: Diagnosis not present

## 2016-02-05 DIAGNOSIS — Z79899 Other long term (current) drug therapy: Secondary | ICD-10-CM | POA: Diagnosis not present

## 2016-02-05 DIAGNOSIS — J449 Chronic obstructive pulmonary disease, unspecified: Secondary | ICD-10-CM | POA: Diagnosis not present

## 2016-02-05 DIAGNOSIS — Z86010 Personal history of colonic polyps: Secondary | ICD-10-CM | POA: Diagnosis not present

## 2016-02-05 DIAGNOSIS — R112 Nausea with vomiting, unspecified: Secondary | ICD-10-CM | POA: Diagnosis not present

## 2016-02-05 DIAGNOSIS — R1084 Generalized abdominal pain: Secondary | ICD-10-CM | POA: Insufficient documentation

## 2016-02-05 DIAGNOSIS — R109 Unspecified abdominal pain: Secondary | ICD-10-CM

## 2016-02-05 DIAGNOSIS — Z9851 Tubal ligation status: Secondary | ICD-10-CM | POA: Diagnosis not present

## 2016-02-05 DIAGNOSIS — I6789 Other cerebrovascular disease: Secondary | ICD-10-CM | POA: Diagnosis not present

## 2016-02-05 DIAGNOSIS — Z950 Presence of cardiac pacemaker: Secondary | ICD-10-CM | POA: Diagnosis not present

## 2016-02-05 DIAGNOSIS — E119 Type 2 diabetes mellitus without complications: Secondary | ICD-10-CM | POA: Diagnosis not present

## 2016-02-05 DIAGNOSIS — E785 Hyperlipidemia, unspecified: Secondary | ICD-10-CM | POA: Diagnosis not present

## 2016-02-05 DIAGNOSIS — I5042 Chronic combined systolic (congestive) and diastolic (congestive) heart failure: Secondary | ICD-10-CM | POA: Insufficient documentation

## 2016-02-05 DIAGNOSIS — F17210 Nicotine dependence, cigarettes, uncomplicated: Secondary | ICD-10-CM | POA: Diagnosis not present

## 2016-02-05 DIAGNOSIS — I10 Essential (primary) hypertension: Secondary | ICD-10-CM | POA: Insufficient documentation

## 2016-02-05 DIAGNOSIS — F329 Major depressive disorder, single episode, unspecified: Secondary | ICD-10-CM | POA: Diagnosis not present

## 2016-02-05 DIAGNOSIS — E669 Obesity, unspecified: Secondary | ICD-10-CM | POA: Insufficient documentation

## 2016-02-05 LAB — COMPREHENSIVE METABOLIC PANEL
ALT: 21 U/L (ref 14–54)
AST: 15 U/L (ref 15–41)
Albumin: 3.7 g/dL (ref 3.5–5.0)
Alkaline Phosphatase: 142 U/L — ABNORMAL HIGH (ref 38–126)
Anion gap: 12 (ref 5–15)
BUN: 8 mg/dL (ref 6–20)
CO2: 25 mmol/L (ref 22–32)
Calcium: 9.3 mg/dL (ref 8.9–10.3)
Chloride: 103 mmol/L (ref 101–111)
Creatinine, Ser: 0.9 mg/dL (ref 0.44–1.00)
GFR calc Af Amer: >60 mL/min (ref >60)
GFR calc non Af Amer: >60 mL/min (ref >60)
Glucose, Bld: 322 mg/dL — ABNORMAL HIGH (ref 65–99)
Potassium: 4.3 mmol/L (ref 3.5–5.1)
Sodium: 140 mmol/L (ref 135–145)
Total Bilirubin: 1.1 mg/dL (ref 0.3–1.2)
Total Protein: 6.7 g/dL (ref 6.5–8.1)

## 2016-02-05 LAB — URINE MICROSCOPIC-ADD ON

## 2016-02-05 LAB — PROTIME-INR
INR: 1.11 (ref 0.00–1.49)
Prothrombin Time: 14.5 seconds (ref 11.6–15.2)

## 2016-02-05 LAB — RAPID URINE DRUG SCREEN, HOSP PERFORMED
Amphetamines: NOT DETECTED
Barbiturates: NOT DETECTED
Benzodiazepines: NOT DETECTED
Cocaine: NOT DETECTED
Opiates: NOT DETECTED
Tetrahydrocannabinol: NOT DETECTED

## 2016-02-05 LAB — I-STAT CHEM 8, ED
BUN: 8 mg/dL (ref 6–20)
Calcium, Ion: 1.09 mmol/L — ABNORMAL LOW (ref 1.13–1.30)
Chloride: 101 mmol/L (ref 101–111)
Creatinine, Ser: 0.7 mg/dL (ref 0.44–1.00)
Glucose, Bld: 309 mg/dL — ABNORMAL HIGH (ref 65–99)
HCT: 50 % — ABNORMAL HIGH (ref 36.0–46.0)
Hemoglobin: 17 g/dL — ABNORMAL HIGH (ref 12.0–15.0)
Potassium: 4.1 mmol/L (ref 3.5–5.1)
Sodium: 140 mmol/L (ref 135–145)
TCO2: 25 mmol/L (ref 0–100)

## 2016-02-05 LAB — CBC
HCT: 45.3 % (ref 36.0–46.0)
Hemoglobin: 14.6 g/dL (ref 12.0–15.0)
MCH: 26.9 pg (ref 26.0–34.0)
MCHC: 32.2 g/dL (ref 30.0–36.0)
MCV: 83.6 fL (ref 78.0–100.0)
Platelets: 207 10*3/uL (ref 150–400)
RBC: 5.42 MIL/uL — ABNORMAL HIGH (ref 3.87–5.11)
RDW: 15 % (ref 11.5–15.5)
WBC: 9.2 10*3/uL (ref 4.0–10.5)

## 2016-02-05 LAB — URINALYSIS, ROUTINE W REFLEX MICROSCOPIC
Bilirubin Urine: NEGATIVE
Glucose, UA: >1000 mg/dL — AB
Ketones, ur: 40 mg/dL — AB
Leukocytes, UA: NEGATIVE
Nitrite: NEGATIVE
Protein, ur: 100 mg/dL — AB
Specific Gravity, Urine: 1.031 — ABNORMAL HIGH (ref 1.005–1.030)
pH: 6 (ref 5.0–8.0)

## 2016-02-05 LAB — DIFFERENTIAL
Basophils Absolute: 0.1 10*3/uL (ref 0.0–0.1)
Basophils Relative: 1 %
Eosinophils Absolute: 0.3 10*3/uL (ref 0.0–0.7)
Eosinophils Relative: 3 %
Lymphocytes Relative: 11 %
Lymphs Abs: 1 10*3/uL (ref 0.7–4.0)
Monocytes Absolute: 0.5 10*3/uL (ref 0.1–1.0)
Monocytes Relative: 6 %
Neutro Abs: 7.3 10*3/uL (ref 1.7–7.7)
Neutrophils Relative %: 80 %

## 2016-02-05 LAB — I-STAT TROPONIN, ED: Troponin i, poc: 0.02 ng/mL (ref 0.00–0.08)

## 2016-02-05 LAB — APTT: aPTT: 32 seconds (ref 24–37)

## 2016-02-05 LAB — ETHANOL: Alcohol, Ethyl (B): <5 mg/dL (ref <5)

## 2016-02-05 MED ORDER — HYDROMORPHONE HCL 1 MG/ML IJ SOLN
1.0000 mg | Freq: Once | INTRAMUSCULAR | Status: AC
  Administered 2016-02-05: 1 mg via INTRAVENOUS
  Filled 2016-02-05: qty 1

## 2016-02-05 MED ORDER — ONDANSETRON 4 MG PO TBDP: 4.0000 mg | ORAL_TABLET | Freq: Three times a day (TID) | ORAL | Status: DC | PRN

## 2016-02-05 MED ORDER — ONDANSETRON HCL 4 MG/2ML IJ SOLN
4.0000 mg | Freq: Once | INTRAMUSCULAR | Status: AC
  Administered 2016-02-05: 4 mg via INTRAVENOUS
  Filled 2016-02-05: qty 2

## 2016-02-05 MED ORDER — OMEPRAZOLE 20 MG PO CPDR: 20.0000 mg | DELAYED_RELEASE_CAPSULE | Freq: Two times a day (BID) | ORAL | Status: DC

## 2016-02-05 MED ORDER — ONDANSETRON HCL 4 MG/2ML IJ SOLN
4.0000 mg | Freq: Once | INTRAMUSCULAR | Status: AC | PRN
  Administered 2016-02-05: 4 mg via INTRAVENOUS
  Filled 2016-02-05: qty 2

## 2016-02-05 MED ORDER — IOPAMIDOL (ISOVUE-300) INJECTION 61%
INTRAVENOUS | Status: AC
  Administered 2016-02-05: 100 mL
  Filled 2016-02-05: qty 100

## 2016-02-05 MED ORDER — IOPAMIDOL (ISOVUE-300) INJECTION 61%
INTRAVENOUS | Status: AC
  Filled 2016-02-05: qty 100

## 2016-02-05 NOTE — ED Notes (Signed)
Per [facility], Pt called ems for back pain, upon ems arrival pt vomited and per ems had slurred speech and L sided weakness. Pt assessed by neurology here, code stroke canceled. Pt c/o vomiting and abdominal pain. PT [id number].

## 2016-02-05 NOTE — Telephone Encounter (Signed)
LMOVM informing pt that her cell adapter for her home monitor has been ordered and that she will receive it by [employer].

## 2016-02-05 NOTE — ED Notes (Signed)
Pt transported to CT ?

## 2016-02-05 NOTE — ED Provider Notes (Addendum)
Patient discussed with Dr. Kathrynn Humble. Underwent stroke evaluation without acute findings. And then began any of abdominal pain and nausea. Normal CT. Normal carotid ultrasound. We will to take by mouth. Family states is been ongoing problem for her for several months. Has not been seen by gastroenterology. His thirsty and Axid taking by mouth here without difficulty. I think she is appropriate for discharge home with gastrology follow-up. Prescriptions for Prilosec, Zofran.  Tanna Furry, MD 02/05/16 2008  Tanna Furry, MD 02/05/16 2009

## 2016-02-05 NOTE — Discharge Instructions (Signed)

## 2016-02-05 NOTE — Code Documentation (Signed)
65yo female arriving to [facility] via [place] at 1151.  Patient called 911 for back pain and when medics arrived they noted patient to have slurred speech and left sided weakness.  Of note, EMS reports patient with O2sats in the 80s on room air on their arrival and patient vomited.  Code stroke activated.  Stroke team at the bedside on patient arrival.  Labs drawn and patient cleared by CT by EDP.  Patient to CT 1, following the CT exam patient became nauseous.  Patient to B14 with team.  NIHSS 0, see documentation for details and code stroke times.  Dr. Cristobal Goldmann at the bedside.  No acute stroke treatment at this time.  Code stroke canceled.  Bedside handoff with ED RN Margreta Journey.

## 2016-02-05 NOTE — Consult Note (Signed)
Requesting Physician: Dr. Kathrynn Humble    Chief Complaint: Code stroke  HPI:                                                                                                                                         [name] is an 64 y.o. female who was noted by family today at 11:20 to be very lethargic and EMS was called. On arrival EMS noted slurred speech and left arm weakness. On arrival patient was lethargic but showed no left arm weakness. She did have dysarthria but was not wearing her dentures. She was alert and oriented and able to follow commands. She denies taking any narcotics but did take her Neurontin this AM. Per family her Neurontin has made her very sleepy.   Date last known well: Today Time last known well: Time: 11:20 tPA Given: No: NIHSS 0    Past Medical History  Diagnosis Date  . Hyperthyroidism, subclinical   . Post menopausal syndrome   . Obesity   . Cardiac defibrillator in situ     Atlas II VR (SJM) implanted by Dr Lovena Le  . Eczema   . Lipoma   . Chronic ulcer of leg ([place])     04-09-15 resolved-not a problem.  . Hyperlipidemia   . Chronic combined systolic and diastolic heart failure (HCC)     a. EF 35-40% in past;  b. Echo 7/13:  EF 45-50%, Gr 2 diast dysfn, mild AI, mild MAC, trivial MR, mild LAE, PASP 47.  [name] NICM (nonischemic cardiomyopathy) ([place])   . Diabetes mellitus   . HTN (hypertension)   . Elevated alkaline phosphatase level     GGT and 5'nucleotidase 8/13 normal  . Hx of cardiac cath     a. [facility] 2003 normal;  b. LHC 6/13:  Mild calcification in the LM, o/w normal coronary arteries, EF 45%.   . Sleep apnea     pt denies 04/12/2013  . COPD (chronic obstructive pulmonary disease) (HCC)     O2 at night  . Automatic implantable cardioverter-defibrillator in situ   . CHF (congestive heart failure) ([place])   . Depression   . Implantable cardioverter-defibrillator (ICD) generator end of life   . Colon polyps 04/12/2013    Rectosigmoid polyp  . History  of oxygen administration     oxygen @ 2 l/m nasally bedtime 24/7  . ATN (acute tubular necrosis) ([place]) 07/15/2014    Past Surgical History  Procedure Laterality Date  . Cardiac defibrillator placement  05/04/2007    SJM Atlas II VR ICD  . Hysteroscopy    . Cardiac defibrillator placement    . Abdominal hysterectomy    . Cardiac catheterization    . Insert / replace / remove pacemaker    . Tubal ligation    . Hernia repair    . Colonoscopy N/A 04/12/2013    Procedure: COLONOSCOPY;  Surgeon:  Beryle Beams, MD;  Location: [facility];  Service: Endoscopy;  Laterality: N/A;  pt.has defibrilator  . Left heart catheterization with coronary angiogram N/A 04/02/2012    Procedure: LEFT HEART CATHETERIZATION WITH CORONARY ANGIOGRAM;  Surgeon: Hillary Bow, MD;  Location: [facility] CATH LAB;  Service: Cardiovascular;  Laterality: N/A;  . Implantable cardioverter defibrillator (icd) generator change N/A 04/02/2014    Procedure: ICD GENERATOR CHANGE;  Surgeon: Evans Lance, MD;  Location: [facility] CATH LAB;  Service: Cardiovascular;  Laterality: N/A;  . Esophagogastroduodenoscopy N/A 04/16/2015    Procedure: ESOPHAGOGASTRODUODENOSCOPY (EGD);  Surgeon: [name], MD;  Location: [facility];  Service: Endoscopy;  Laterality: N/A;  . Colonoscopy N/A 04/16/2015    Procedure: COLONOSCOPY;  Surgeon: [name], MD;  Location: [facility] ENDOSCOPY;  Service: Endoscopy;  Laterality: N/A;    Family History  Problem Relation Age of Onset  . Stroke Mother   . Seizures Father   . Diabetes Sister   . Asthma Maternal Aunt     aunts  . Asthma Maternal Uncle     uncles  . Heart disease Father   . Heart disease Paternal Aunt     aunts  . Heart disease Paternal Uncle     uncles  . Heart disease Maternal Aunt     aunts  . Heart disease Maternal Uncle     uncles  . Heart disease Maternal Grandfather   . Colon cancer Neg Hx   . Colon polyps Neg Hx   . Esophageal cancer Neg Hx   . Kidney disease Neg Hx   .  Gallbladder disease Neg Hx    Social History:  reports that she has been smoking Cigarettes.  She has a 7 pack-year smoking history. She has never used smokeless tobacco. She reports that she does not drink alcohol or use illicit drugs.  Allergies:  Allergies  Allergen Reactions  . Actos [Pioglitazone]     REACTION: congestive heart failure ACTOS  . Avandia [Rosiglitazone Maleate]     unknown  . Hydrocortisone     unknown    Medications:                                                                                                                           No current facility-administered medications for this encounter.   Current Outpatient Prescriptions  Medication Sig Dispense Refill  . albuterol (PROVENTIL HFA;VENTOLIN HFA) 108 (90 Base) MCG/ACT inhaler Inhale 2 puffs into the lungs every 6 (six) hours as needed for wheezing or shortness of breath. 1 Inhaler 2  . albuterol (PROVENTIL) (2.5 MG/3ML) 0.083% nebulizer solution USE 1 VIAL VIA NEBULIZER EVERY 6 HOURS AS NEEDED FOR WHEEZING OR SHORTNESS OF BREATH 1125 mL 3  . amLODipine-olmesartan (AZOR) 10-40 MG tablet Take 1 tablet by mouth daily. 90 tablet 3  . aspirin EC 81 MG tablet Take 1 tablet (81 mg total) by mouth daily. 90 tablet 3  . atorvastatin (LIPITOR) 80 MG  tablet Take 1 tablet (80 mg total) by mouth at bedtime. 90 tablet 3  . furosemide (LASIX) 40 MG tablet Take 1 tablet (40 mg total) by mouth daily. Place Rx on file (filled Rx on 11/11/15) 90 tablet 3  . gabapentin (NEURONTIN) 300 MG capsule Take 2 capsules (600 mg total) by mouth 3 (three) times daily. Place Rx on file (picked up Rx on 11/11/15) 180 capsule 3  . glucose blood (ONE TOUCH ULTRA TEST) test strip Use as instructed by doctor up to 3x daily,dx code 250.02 insulin requiring 100 each 5  . insulin glargine (LANTUS) 100 UNIT/ML injection Inject 0.6 mLs (60 Units total) into the skin daily. 15 mL 5  . insulin lispro (HUMALOG KWIKPEN) 100 UNIT/ML KiwkPen Inject 0.1  mLs (10 Units total) into the skin 3 (three) times daily. 15 mL 3  . Insulin Syringe-Needle U-100 31G X 5/16" 1 ML MISC Use to give Lantus Insulin daily. E11.22. 100 each 11  . lisinopril (PRINIVIL,ZESTRIL) 40 MG tablet Take 40 mg by mouth daily.    . metFORMIN (GLUCOPHAGE-XR) 500 MG 24 hr tablet Take 4 tablets (2,000 mg total) by mouth daily with breakfast. 360 tablet 3  . metoprolol succinate (TOPROL-XL) 50 MG 24 hr tablet Take 1 tablet (50 mg total) by mouth daily. 90 tablet 3  . naproxen (NAPROSYN) 500 MG tablet Take 1 tablet (500 mg total) by mouth 2 (two) times daily as needed for moderate pain. 180 tablet 0  . nitroGLYCERIN (NITROSTAT) 0.4 MG SL tablet Place 0.4 mg under the tongue every 5 (five) minutes as needed for chest pain.    . predniSONE (STERAPRED UNI-PAK 21 TAB) 10 MG (21) TBPK tablet Take 1 tablet (10 mg total) by mouth daily. Take 6 tabs by mouth daily  for 2 days, then 5 tabs for 2 days, then 4 tabs for 2 days, then 3 tabs for 2 days, 2 tabs for 2 days, then 1 tab by mouth daily for 2 days 42 tablet 0  . tiotropium (SPIRIVA) 18 MCG inhalation capsule Place 1 capsule (18 mcg total) into inhaler and inhale daily. 30 capsule 11     ROS:                                                                                                                                       History obtained from the patient  General ROS: negative for - chills, fatigue, fever, night sweats, weight gain or weight loss Psychological ROS: negative for - behavioral disorder, hallucinations, memory difficulties, mood swings or suicidal ideation Ophthalmic ROS: negative for - blurry vision, double vision, eye pain or loss of vision ENT ROS: negative for - epistaxis, nasal discharge, oral lesions, sore throat, tinnitus or vertigo Allergy and Immunology ROS: negative for - hives or itchy/watery eyes Hematological and Lymphatic ROS: negative for - bleeding problems, bruising or swollen lymph nodes Endocrine  ROS: negative  for - galactorrhea, hair pattern changes, polydipsia/polyuria or temperature intolerance Respiratory ROS: negative for - cough, hemoptysis, shortness of breath or wheezing Cardiovascular ROS: negative for - chest pain, dyspnea on exertion, edema or irregular heartbeat Gastrointestinal ROS: negative for - abdominal pain, diarrhea, hematemesis, nausea/vomiting or stool incontinence Genito-Urinary ROS: negative for - dysuria, hematuria, incontinence or urinary frequency/urgency Musculoskeletal ROS: negative for - joint swelling or muscular weakness Neurological ROS: as noted in HPI Dermatological ROS: negative for rash and skin lesion changes  Neurologic Examination:                                                                                                      Blood pressure 162/89, pulse 106, temperature 98.2 F (36.8 C), temperature source Oral, resp. rate 18, weight 112.2 kg (247 lb 5.7 oz), SpO2 93 %.  HEENT-  Normocephalic, no lesions, without obvious abnormality.  Normal external eye and conjunctiva.  Normal TM's bilaterally.  Normal auditory canals and external ears. Normal external nose, mucus membranes and septum.  Normal pharynx. Cardiovascular- S1, S2 normal, pulses palpable throughout   Lungs- chest clear, no wheezing, rales, normal symmetric air entry Abdomen- soft, non-tender; bowel sounds normal; no masses,  no organomegaly Extremities- no edema Lymph-no adenopathy palpable Musculoskeletal-no joint tenderness, deformity or swelling Skin-warm and dry, no hyperpigmentation, vitiligo, or suspicious lesions  Neurological Examination Mental Status: Alert, oriented, thought content appropriate.  Speech dysarthric but adentulous without evidence of aphasia.  Able to follow 3 step commands without difficulty. Cranial Nerves: II: Visual fields grossly normal, pupils equal, round, reactive to light and accommodation III,IV, VI: ptosis not present, extra-ocular motions  intact bilaterally V,VII: smile symmetric, facial light touch sensation normal bilaterally VIII: hearing normal bilaterally IX,X: uvula rises symmetrically XI: bilateral shoulder shrug XII: midline tongue extension Motor: Right : Upper extremity   5/5    Left:     Upper extremity   5/5  Lower extremity   5/5     Lower extremity   5/5 Tone and bulk:normal tone throughout; no atrophy noted Sensory: Pinprick and light touch intact throughout, bilaterally Deep Tendon Reflexes: 1+ and symmetric throughout Plantars: Right: downgoing   Left: downgoing Cerebellar: normal finger-to-nose,  and normal heel-to-shin test Gait: not tested       Lab Results: Basic Metabolic Panel:  Recent Labs Lab 02/05/16 1205  NA 140  K 4.1  CL 101  GLUCOSE 309*  BUN 8  CREATININE 0.70    Liver Function Tests: No results for input(s): AST, ALT, ALKPHOS, BILITOT, PROT, ALBUMIN in the last 168 hours. No results for input(s): LIPASE, AMYLASE in the last 168 hours. No results for input(s): AMMONIA in the last 168 hours.  CBC:  Recent Labs Lab 02/05/16 1205  HGB 17.0*  HCT 50.0*    Cardiac Enzymes: No results for input(s): CKTOTAL, CKMB, CKMBINDEX, TROPONINI in the last 168 hours.  Lipid Panel: No results for input(s): CHOL, TRIG, HDL, CHOLHDL, VLDL, LDLCALC in the last 168 hours.  CBG: No results for input(s): GLUCAP in the last 168 hours.  Microbiology: Results for  orders placed or performed during the hospital encounter of 08/06/14  Culture, respiratory (NON-Expectorated)     Status: None   Collection Time: 08/06/14 11:39 AM  Result Value Ref Range Status   Specimen Description SPUTUM  Final   Special Requests NONE  Final   Gram Stain   Final    MODERATE WBC PRESENT,BOTH PMN AND MONONUCLEAR RARE SQUAMOUS EPITHELIAL CELLS PRESENT RARE GRAM POSITIVE COCCI IN PAIRS Performed at [facility]    Culture   Final    NORMAL OROPHARYNGEAL FLORA Performed at [facility]    Report Status 08/08/2014 FINAL  Final  MRSA PCR Screening     Status: None   Collection Time: 08/06/14  2:26 PM  Result Value Ref Range Status   MRSA by PCR NEGATIVE NEGATIVE Final    Comment:        The GeneXpert MRSA Assay (FDA approved for NASAL specimens only), is one component of a comprehensive MRSA colonization surveillance program. It is not intended to diagnose MRSA infection nor to guide or monitor treatment for MRSA infections.     Coagulation Studies: No results for input(s): LABPROT, INR in the last 72 hours.  Imaging: Ct Head Wo Contrast  02/05/2016  CLINICAL DATA:  Slurred speech and left-sided weakness EXAM: CT HEAD WITHOUT CONTRAST TECHNIQUE: Contiguous axial images were obtained from the base of the skull through the vertex without intravenous contrast. COMPARISON:  None. FINDINGS: The ventricles are normal in size and configuration. There is no intracranial mass, hemorrhage, extra-axial fluid collection, or midline shift. There is patchy small vessel disease in the centra semiovale bilaterally. Elsewhere gray-white compartments appear normal. No acute infarct is evident. The middle cerebral artery attenuation is symmetric bilaterally. Bony calvarium appears intact. Mastoid air cells are clear. No intraorbital lesions are evident. IMPRESSION: Patchy periventricular small vessel disease bilaterally. No acute infarct evident. No hemorrhage or mass effect. Critical Value/emergent results were called by telephone at the time of interpretation on 02/05/2016 at 12:09 pm to Dr. Cordie Grice, neurology, who verbally acknowledged these results. Electronically Signed   By: Lowella Grip III M.D.   On: 02/05/2016 12:10       Assessment and plan discussed with with attending physician and they are in agreement.    Peggy Quill PA-C [facility] [phone]  02/05/2016, 12:24 PM   Assessment: 65 y.o. female presenting to hospital as a code stroke. She was  first noted to be lethargic at home but EMS thought she may have dysarthria and left arm weakness. Currently she has no weakness, appears short of breath and is currently nauseated. She admits to taking Neurontin this AM. NIHSS 0 and CT head with no acute pathology.  Stroke Risk Factors - diabetes mellitus, hyperlipidemia and hypertension  Recommend: 1) Code stroke Canalled 2) chest Xray 3) consider stopping Neurontin as this can cause drowsiness.

## 2016-02-05 NOTE — ED Notes (Signed)
Code Stroke paged out on patient in the field @ 1143.

## 2016-02-05 NOTE — ED Notes (Signed)
Pt transported to US

## 2016-02-07 DIAGNOSIS — R1031 Right lower quadrant pain: Secondary | ICD-10-CM | POA: Diagnosis not present

## 2016-02-07 DIAGNOSIS — I509 Heart failure, unspecified: Secondary | ICD-10-CM | POA: Diagnosis not present

## 2016-02-07 DIAGNOSIS — E119 Type 2 diabetes mellitus without complications: Secondary | ICD-10-CM | POA: Diagnosis not present

## 2016-02-07 DIAGNOSIS — I10 Essential (primary) hypertension: Secondary | ICD-10-CM | POA: Diagnosis not present

## 2016-02-07 DIAGNOSIS — J449 Chronic obstructive pulmonary disease, unspecified: Secondary | ICD-10-CM | POA: Diagnosis not present

## 2016-02-07 DIAGNOSIS — Z794 Long term (current) use of insulin: Secondary | ICD-10-CM | POA: Diagnosis not present

## 2016-02-07 DIAGNOSIS — M5430 Sciatica, unspecified side: Secondary | ICD-10-CM | POA: Diagnosis not present

## 2016-02-08 ENCOUNTER — Other Ambulatory Visit: Payer: Self-pay | Admitting: Internal Medicine

## 2016-02-08 DIAGNOSIS — R1031 Right lower quadrant pain: Secondary | ICD-10-CM | POA: Diagnosis not present

## 2016-02-10 ENCOUNTER — Observation Stay (HOSPITAL_COMMUNITY): Payer: Medicare Other

## 2016-02-10 ENCOUNTER — Ambulatory Visit (HOSPITAL_COMMUNITY): Payer: Medicare Other

## 2016-02-10 ENCOUNTER — Encounter: Payer: Self-pay | Admitting: Internal Medicine

## 2016-02-10 ENCOUNTER — Observation Stay (HOSPITAL_COMMUNITY)
Admission: AD | Admit: 2016-02-10 | Discharge: 2016-02-13 | Disposition: A | Discharge status: Home / Self Care | Payer: Medicare Other | Source: Ambulatory Visit | Attending: Oncology | Admitting: Oncology

## 2016-02-10 ENCOUNTER — Ambulatory Visit (INDEPENDENT_AMBULATORY_CARE_PROVIDER_SITE_OTHER): Payer: Medicare Other | Admitting: Internal Medicine

## 2016-02-10 ENCOUNTER — Ambulatory Visit: Payer: Medicare Other | Admitting: Pharmacist

## 2016-02-10 VITALS — BP 151/110 | HR 62 | Temp 98.3°F | Wt 235.8 lb

## 2016-02-10 DIAGNOSIS — R112 Nausea with vomiting, unspecified: Secondary | ICD-10-CM | POA: Insufficient documentation

## 2016-02-10 DIAGNOSIS — M4800 Spinal stenosis, site unspecified: Secondary | ICD-10-CM | POA: Insufficient documentation

## 2016-02-10 DIAGNOSIS — E1140 Type 2 diabetes mellitus with diabetic neuropathy, unspecified: Secondary | ICD-10-CM | POA: Insufficient documentation

## 2016-02-10 DIAGNOSIS — I5023 Acute on chronic systolic (congestive) heart failure: Secondary | ICD-10-CM | POA: Insufficient documentation

## 2016-02-10 DIAGNOSIS — F17210 Nicotine dependence, cigarettes, uncomplicated: Secondary | ICD-10-CM | POA: Diagnosis not present

## 2016-02-10 DIAGNOSIS — Z90710 Acquired absence of both cervix and uterus: Secondary | ICD-10-CM | POA: Insufficient documentation

## 2016-02-10 DIAGNOSIS — I10 Essential (primary) hypertension: Secondary | ICD-10-CM

## 2016-02-10 DIAGNOSIS — Z7951 Long term (current) use of inhaled steroids: Secondary | ICD-10-CM | POA: Insufficient documentation

## 2016-02-10 DIAGNOSIS — Z7982 Long term (current) use of aspirin: Secondary | ICD-10-CM | POA: Insufficient documentation

## 2016-02-10 DIAGNOSIS — Z794 Long term (current) use of insulin: Secondary | ICD-10-CM | POA: Insufficient documentation

## 2016-02-10 DIAGNOSIS — E1065 Type 1 diabetes mellitus with hyperglycemia: Secondary | ICD-10-CM | POA: Diagnosis not present

## 2016-02-10 DIAGNOSIS — R0689 Other abnormalities of breathing: Secondary | ICD-10-CM | POA: Diagnosis not present

## 2016-02-10 DIAGNOSIS — N182 Chronic kidney disease, stage 2 (mild): Secondary | ICD-10-CM

## 2016-02-10 DIAGNOSIS — G8929 Other chronic pain: Secondary | ICD-10-CM | POA: Diagnosis not present

## 2016-02-10 DIAGNOSIS — E669 Obesity, unspecified: Secondary | ICD-10-CM | POA: Diagnosis not present

## 2016-02-10 DIAGNOSIS — E1122 Type 2 diabetes mellitus with diabetic chronic kidney disease: Secondary | ICD-10-CM

## 2016-02-10 DIAGNOSIS — J449 Chronic obstructive pulmonary disease, unspecified: Secondary | ICD-10-CM | POA: Diagnosis not present

## 2016-02-10 DIAGNOSIS — I5042 Chronic combined systolic (congestive) and diastolic (congestive) heart failure: Secondary | ICD-10-CM

## 2016-02-10 DIAGNOSIS — R0902 Hypoxemia: Secondary | ICD-10-CM | POA: Diagnosis not present

## 2016-02-10 DIAGNOSIS — E872 Acidosis: Secondary | ICD-10-CM | POA: Insufficient documentation

## 2016-02-10 DIAGNOSIS — R5383 Other fatigue: Secondary | ICD-10-CM

## 2016-02-10 DIAGNOSIS — J9601 Acute respiratory failure with hypoxia: Secondary | ICD-10-CM | POA: Diagnosis not present

## 2016-02-10 DIAGNOSIS — I7389 Other specified peripheral vascular diseases: Secondary | ICD-10-CM | POA: Diagnosis not present

## 2016-02-10 DIAGNOSIS — I081 Rheumatic disorders of both mitral and tricuspid valves: Secondary | ICD-10-CM | POA: Insufficient documentation

## 2016-02-10 DIAGNOSIS — N179 Acute kidney failure, unspecified: Secondary | ICD-10-CM | POA: Insufficient documentation

## 2016-02-10 DIAGNOSIS — Z95810 Presence of automatic (implantable) cardiac defibrillator: Secondary | ICD-10-CM | POA: Insufficient documentation

## 2016-02-10 DIAGNOSIS — J9602 Acute respiratory failure with hypercapnia: Secondary | ICD-10-CM | POA: Insufficient documentation

## 2016-02-10 DIAGNOSIS — I491 Atrial premature depolarization: Secondary | ICD-10-CM | POA: Diagnosis not present

## 2016-02-10 DIAGNOSIS — K449 Diaphragmatic hernia without obstruction or gangrene: Secondary | ICD-10-CM | POA: Insufficient documentation

## 2016-02-10 DIAGNOSIS — E1040 Type 1 diabetes mellitus with diabetic neuropathy, unspecified: Secondary | ICD-10-CM | POA: Insufficient documentation

## 2016-02-10 DIAGNOSIS — R778 Other specified abnormalities of plasma proteins: Secondary | ICD-10-CM | POA: Diagnosis not present

## 2016-02-10 DIAGNOSIS — I509 Heart failure, unspecified: Secondary | ICD-10-CM | POA: Insufficient documentation

## 2016-02-10 DIAGNOSIS — Z452 Encounter for adjustment and management of vascular access device: Secondary | ICD-10-CM | POA: Insufficient documentation

## 2016-02-10 DIAGNOSIS — Z6841 Body Mass Index (BMI) 40.0 and over, adult: Secondary | ICD-10-CM | POA: Insufficient documentation

## 2016-02-10 DIAGNOSIS — E785 Hyperlipidemia, unspecified: Secondary | ICD-10-CM | POA: Insufficient documentation

## 2016-02-10 DIAGNOSIS — R410 Disorientation, unspecified: Secondary | ICD-10-CM

## 2016-02-10 DIAGNOSIS — I517 Cardiomegaly: Secondary | ICD-10-CM | POA: Diagnosis not present

## 2016-02-10 DIAGNOSIS — I499 Cardiac arrhythmia, unspecified: Secondary | ICD-10-CM

## 2016-02-10 DIAGNOSIS — Z79899 Other long term (current) drug therapy: Secondary | ICD-10-CM | POA: Insufficient documentation

## 2016-02-10 DIAGNOSIS — Z8249 Family history of ischemic heart disease and other diseases of the circulatory system: Secondary | ICD-10-CM | POA: Insufficient documentation

## 2016-02-10 DIAGNOSIS — Z9981 Dependence on supplemental oxygen: Secondary | ICD-10-CM | POA: Insufficient documentation

## 2016-02-10 DIAGNOSIS — Z888 Allergy status to other drugs, medicaments and biological substances status: Secondary | ICD-10-CM | POA: Insufficient documentation

## 2016-02-10 DIAGNOSIS — R509 Fever, unspecified: Secondary | ICD-10-CM | POA: Insufficient documentation

## 2016-02-10 DIAGNOSIS — E860 Dehydration: Secondary | ICD-10-CM | POA: Insufficient documentation

## 2016-02-10 DIAGNOSIS — R4182 Altered mental status, unspecified: Secondary | ICD-10-CM | POA: Insufficient documentation

## 2016-02-10 DIAGNOSIS — I429 Cardiomyopathy, unspecified: Secondary | ICD-10-CM | POA: Insufficient documentation

## 2016-02-10 DIAGNOSIS — IMO0002 Reserved for concepts with insufficient information to code with codable children: Secondary | ICD-10-CM | POA: Insufficient documentation

## 2016-02-10 DIAGNOSIS — G9340 Encephalopathy, unspecified: Secondary | ICD-10-CM | POA: Diagnosis not present

## 2016-02-10 DIAGNOSIS — Z823 Family history of stroke: Secondary | ICD-10-CM | POA: Insufficient documentation

## 2016-02-10 DIAGNOSIS — F329 Major depressive disorder, single episode, unspecified: Secondary | ICD-10-CM | POA: Insufficient documentation

## 2016-02-10 DIAGNOSIS — R918 Other nonspecific abnormal finding of lung field: Secondary | ICD-10-CM | POA: Insufficient documentation

## 2016-02-10 DIAGNOSIS — R109 Unspecified abdominal pain: Secondary | ICD-10-CM | POA: Insufficient documentation

## 2016-02-10 DIAGNOSIS — G4733 Obstructive sleep apnea (adult) (pediatric): Secondary | ICD-10-CM | POA: Insufficient documentation

## 2016-02-10 DIAGNOSIS — Z833 Family history of diabetes mellitus: Secondary | ICD-10-CM | POA: Insufficient documentation

## 2016-02-10 DIAGNOSIS — E0590 Thyrotoxicosis, unspecified without thyrotoxic crisis or storm: Secondary | ICD-10-CM | POA: Insufficient documentation

## 2016-02-10 DIAGNOSIS — I739 Peripheral vascular disease, unspecified: Secondary | ICD-10-CM | POA: Insufficient documentation

## 2016-02-10 DIAGNOSIS — Z825 Family history of asthma and other chronic lower respiratory diseases: Secondary | ICD-10-CM | POA: Insufficient documentation

## 2016-02-10 DIAGNOSIS — R000 Tachycardia, unspecified: Secondary | ICD-10-CM | POA: Diagnosis not present

## 2016-02-10 DIAGNOSIS — M545 Low back pain: Secondary | ICD-10-CM

## 2016-02-10 DIAGNOSIS — I110 Hypertensive heart disease with heart failure: Secondary | ICD-10-CM | POA: Insufficient documentation

## 2016-02-10 DIAGNOSIS — R0989 Other specified symptoms and signs involving the circulatory and respiratory systems: Secondary | ICD-10-CM | POA: Insufficient documentation

## 2016-02-10 DIAGNOSIS — Z86010 Personal history of colonic polyps: Secondary | ICD-10-CM | POA: Insufficient documentation

## 2016-02-10 LAB — CBC
HCT: 41.7 % (ref 36.0–46.0)
Hemoglobin: 13.5 g/dL (ref 12.0–15.0)
MCH: 25.7 pg — ABNORMAL LOW (ref 26.0–34.0)
MCHC: 32.4 g/dL (ref 30.0–36.0)
MCV: 79.3 fL (ref 78.0–100.0)
Platelets: 175 10*3/uL (ref 150–400)
RBC: 5.26 MIL/uL — ABNORMAL HIGH (ref 3.87–5.11)
RDW: 14.9 % (ref 11.5–15.5)
WBC: 15.3 10*3/uL — ABNORMAL HIGH (ref 4.0–10.5)

## 2016-02-10 LAB — COMPREHENSIVE METABOLIC PANEL
ALT: 18 U/L (ref 14–54)
AST: 15 U/L (ref 15–41)
Albumin: 3 g/dL — ABNORMAL LOW (ref 3.5–5.0)
Alkaline Phosphatase: 183 U/L — ABNORMAL HIGH (ref 38–126)
Anion gap: 13 (ref 5–15)
BUN: 42 mg/dL — ABNORMAL HIGH (ref 6–20)
CO2: 26 mmol/L (ref 22–32)
Calcium: 8.2 mg/dL — ABNORMAL LOW (ref 8.9–10.3)
Chloride: 92 mmol/L — ABNORMAL LOW (ref 101–111)
Creatinine, Ser: 3.94 mg/dL — ABNORMAL HIGH (ref 0.44–1.00)
GFR calc Af Amer: 13 mL/min — ABNORMAL LOW (ref >60)
GFR calc non Af Amer: 11 mL/min — ABNORMAL LOW (ref >60)
Glucose, Bld: 369 mg/dL — ABNORMAL HIGH (ref 65–99)
Potassium: 4.1 mmol/L (ref 3.5–5.1)
Sodium: 131 mmol/L — ABNORMAL LOW (ref 135–145)
Total Bilirubin: 1.2 mg/dL (ref 0.3–1.2)
Total Protein: 7 g/dL (ref 6.5–8.1)

## 2016-02-10 LAB — GLUCOSE, CAPILLARY
Glucose-Capillary: 166 mg/dL — ABNORMAL HIGH (ref 65–99)
Glucose-Capillary: 381 mg/dL — ABNORMAL HIGH (ref 65–99)

## 2016-02-10 LAB — BLOOD GAS, ARTERIAL
Acid-Base Excess: 1.1 mmol/L (ref 0.0–2.0)
Bicarbonate: 26.1 mEq/L — ABNORMAL HIGH (ref 20.0–24.0)
Drawn by: 24610
FIO2: 0.21
O2 Saturation: 73.6 %
Patient temperature: 98.6
TCO2: 27.6 mmol/L (ref 0–100)
pCO2 arterial: 48.7 mmHg — ABNORMAL HIGH (ref 35.0–45.0)
pH, Arterial: 7.349 — ABNORMAL LOW (ref 7.350–7.450)
pO2, Arterial: 41.9 mmHg — ABNORMAL LOW (ref 80.0–100.0)

## 2016-02-10 LAB — TROPONIN I: Troponin I: 0.05 ng/mL — ABNORMAL HIGH (ref <0.031)

## 2016-02-10 LAB — MAGNESIUM: Magnesium: 1.8 mg/dL (ref 1.7–2.4)

## 2016-02-10 LAB — POCT GLYCOSYLATED HEMOGLOBIN (HGB A1C): Hemoglobin A1C: 13.7

## 2016-02-10 LAB — TSH: TSH: 0.603 u[IU]/mL (ref 0.350–4.500)

## 2016-02-10 LAB — PHOSPHORUS: Phosphorus: 3.7 mg/dL (ref 2.5–4.6)

## 2016-02-10 MED ORDER — INSULIN ASPART 100 UNIT/ML ~~LOC~~ SOLN
0.0000 [IU] | Freq: Three times a day (TID) | SUBCUTANEOUS | Status: DC
  Administered 2016-02-11: 2 [IU] via SUBCUTANEOUS
  Administered 2016-02-12: 3 [IU] via SUBCUTANEOUS
  Administered 2016-02-12: 5 [IU] via SUBCUTANEOUS
  Administered 2016-02-13: 3 [IU] via SUBCUTANEOUS

## 2016-02-10 MED ORDER — INSULIN GLARGINE 100 UNIT/ML ~~LOC~~ SOLN
10.0000 [IU] | Freq: Every day | SUBCUTANEOUS | Status: DC
  Administered 2016-02-11 – 2016-02-13 (×4): 10 [IU] via SUBCUTANEOUS
  Filled 2016-02-10 (×4): qty 0.1

## 2016-02-10 MED ORDER — ASPIRIN EC 81 MG PO TBEC
81.0000 mg | DELAYED_RELEASE_TABLET | Freq: Every day | ORAL | Status: DC
  Administered 2016-02-12 – 2016-02-13 (×2): 81 mg via ORAL
  Filled 2016-02-10 (×2): qty 1

## 2016-02-10 MED ORDER — SODIUM CHLORIDE 0.9% FLUSH
3.0000 mL | Freq: Two times a day (BID) | INTRAVENOUS | Status: DC
  Administered 2016-02-11 – 2016-02-12 (×4): 3 mL via INTRAVENOUS

## 2016-02-10 MED ORDER — FUROSEMIDE 10 MG/ML IJ SOLN: 40.0000 mg | Freq: Once | INTRAMUSCULAR | Status: DC

## 2016-02-10 MED ORDER — ENOXAPARIN SODIUM 30 MG/0.3ML ~~LOC~~ SOLN
30.0000 mg | SUBCUTANEOUS | Status: DC
  Administered 2016-02-11: 30 mg via SUBCUTANEOUS
  Filled 2016-02-10: qty 0.3

## 2016-02-10 MED ORDER — ATORVASTATIN CALCIUM 80 MG PO TABS
80.0000 mg | ORAL_TABLET | Freq: Every day | ORAL | Status: DC
  Administered 2016-02-12: 80 mg via ORAL
  Filled 2016-02-10: qty 1

## 2016-02-10 MED ORDER — TIOTROPIUM BROMIDE MONOHYDRATE 18 MCG IN CAPS
18.0000 ug | ORAL_CAPSULE | Freq: Every day | RESPIRATORY_TRACT | Status: DC
  Administered 2016-02-11: 18 ug via RESPIRATORY_TRACT
  Filled 2016-02-10: qty 5

## 2016-02-10 MED ORDER — SODIUM CHLORIDE 0.9 % IV SOLN: INTRAVENOUS | Status: DC

## 2016-02-10 MED ORDER — ENOXAPARIN SODIUM 40 MG/0.4ML ~~LOC~~ SOLN: 40.0000 mg | SUBCUTANEOUS | Status: DC

## 2016-02-10 MED ORDER — ACETAMINOPHEN 650 MG RE SUPP
650.0000 mg | Freq: Four times a day (QID) | RECTAL | Status: DC | PRN
  Filled 2016-02-10: qty 1

## 2016-02-10 MED ORDER — ACETAMINOPHEN 325 MG PO TABS
650.0000 mg | ORAL_TABLET | Freq: Four times a day (QID) | ORAL | Status: DC | PRN
  Administered 2016-02-11 – 2016-02-13 (×2): 650 mg via ORAL
  Filled 2016-02-10 (×3): qty 2

## 2016-02-10 MED ORDER — ALBUTEROL SULFATE (2.5 MG/3ML) 0.083% IN NEBU: 2.5000 mg | INHALATION_SOLUTION | RESPIRATORY_TRACT | Status: DC | PRN

## 2016-02-10 MED ORDER — INSULIN ASPART 100 UNIT/ML ~~LOC~~ SOLN: 0.0000 [IU] | Freq: Every day | SUBCUTANEOUS | Status: DC

## 2016-02-10 NOTE — Procedures (Signed)
PROCEDURE:  Internal jugular central venous catheter, U/S guided.  INDICATION: Vascular access required for diagnostic and therapeutic interventions, PIV unable to be obtained on multiple US guided attempts  PROCEDURE OPERATOR: Dr. Benjamine Mola Dr. Heber [place] was present at bedside in a supervising capacity throughout the entire procedure.  CONSENT: Consent obtained from daughter, numerous family members present  PROCEDURE SUMMARY:  A time-out was performed. Patient was placed into trendelenburg position. The patient's left neck region was prepped and draped in sterile fashion using chlorhexidine scrub. Anesthesia was achieved with 1% lidocaine. The left internal jugular vein was accessed under ultrasound guidance using a finder needle and sheath. Venous blood was withdrawn and the sheath was advanced into the vein and the needle was withdrawn. A guidewire was advanced through the sheath. A small incision was made with a 10 blade scalpel and the sheath was exchanged for a dilator over the guidewire until appropriate dilation was obtained. The dilator was removed and an 7.5 [place] central venous triple-lumen catheter was advanced over the guidewire and secured into place with 3 sutures at 17 cm. At time of procedure completion, all ports aspirated and flushed properly. Post-procedure x-ray shows the tip of the catheter within the superior vena cava.  COMPLICATIONS: No immediate complications  ESTIMATED BLOOD LOSS: 20 ccs  Collier [place], MD 02/10/2016, 11:50 PM

## 2016-02-10 NOTE — Progress Notes (Signed)
Late entry- 1445  I was called to obtain ABG.  Upon entering room, noted pt snoring- RR 8 bpm, pt slightly arousable to my voice, but did not respond to pain during ABG stick.  Noted room air sat ranging 80-83%, pt w/ occasional twitching. ABG was obtained on room air (per MD request), post ABG pt was placed on 6 lpm [place] sat improved to 92-96%, then placed on NRB by RN.  Rapid response was notified of pt condition by clinic RN.  Bipap was brought to bedside d/t decreased mental status and hypoxia.  Per MD- hold off on placing pt on bipap for now.  Unit RT aware of pt, Rapid response RN transported pt to [facility].

## 2016-02-10 NOTE — Assessment & Plan Note (Signed)
On presentation, patient was very somnolent, lethargic, and confused. She kept following asleep during the exam. Her main complaint is of her chronic low back pain that radiates to her right lower quadrant and right upper thigh. She admits to decreased appetite and nausea with episodes of dry heaves. She denies fever, chills, headache, chest pain, shortness of breath, abdominal pain, weakness, dysuria, diarrhea, constipation, blood in her stool or urine or vaginal discharge or bleeding.  Patient's daughter was present during the exam, and states her mother was her normal self on Thursday, on Friday, patient had was confused, somnolent and lethargic which is unusual for her. She had not taken any new medications or done anything differently. Patient's daughter took her to the ED where a code stroke was called, but given that she had a non-focal exam by Neurology, this was cancelled. CBC, BMET, UA, UDS, CT abdomen pelvis and abdominal US were normal. Patient was discharged with zofran and suggested that she stop taking gabapentin due to somnolence.  On Saturday, patient remained the same- somnolent, lethargic, confused, complaining of pain.   On Sunday, patient's daughter took her to [facility] where again, work up was normal including a transvaginal US (due to complaint of RLQ) but patient was also found to be hypoxic in the mid 79s. Patient was discharged from the ED.  Today, patient continues to be the same as above. She has obvious obstruction as she sleeps concerning for OSA. Her vitals show she is hypertensive, tachycardic and hypoxic. This improves with O2 via [place]. Exam is remarkable for confusion, although she is oriented x 3 and lethargy. HR is tachycardic, lungs have scattered expiratory wheezes and mild inspiratory crackles in bases. Abdomen is completely soft and non-tender without guarding. There is trace pitting edema in lower extremities.   The above information and exam is concerning for CO2  Narcosis with hypoxia from obesity hypoventilation versus another underlying respiratory process driving hypoxia (COPD). I doubt gabapentin is causing all of her lethargy since she has been taking it since March. However, I would d/c this as well. We will need to be careful with O2 supplementation to not worsen her CO2, keep sats 88-92%. I will order preliminary labs, CXR, ABG, and EKG to start with. CBG 381.  Plan: Admit to Telemetry Obtain ABG CXR CBC/BMET EKG Keep Sats 88-92%, avoid over-oxygenation

## 2016-02-10 NOTE — Progress Notes (Signed)
Subjective:    Patient ID: [name], female    DOB: Mar 19, 1951, 65 y.o.   MRN: [id number]  HPI [name] is a 65 yo F with PMHx of combined CHF, HTN, NICM, uncontrolled T2DM, COPD on 2L O2 at night, probable OSA and chronic back pain from spinal stenosis who presented for routine follow up for T2DM.   On presentation, patient was very somnolent, lethargic, and confused. She kept following asleep during the exam. Her main complaint is of her chronic low back pain that radiates to her right lower quadrant and right upper thigh. She admits to decreased appetite and nausea with episodes of dry heaves. She denies fever, chills, headache, chest pain, shortness of breath, abdominal pain, weakness, dysuria, diarrhea, constipation, blood in her stool or urine or vaginal discharge or bleeding.  Patient's daughter was present during the exam, and states her mother was her normal self on Thursday, on Friday, patient had was confused, somnolent and lethargic which is unusual for her. She had not taken any new medications or done anything differently. Patient's daughter took her to the ED where a code stroke was called, but given that she had a non-focal exam by Neurology, this was cancelled. CBC, BMET, UA, UDS, CT abdomen pelvis and abdominal US were normal. Patient was discharged with zofran and suggested that she stop taking gabapentin due to somnolence.  On Saturday, patient remained the same- somnolent, lethargic, confused, complaining of pain.   On Sunday, patient's daughter took her to [facility] where again, work up was normal including a transvaginal US (due to complaint of RLQ) but patient was also found to be hypoxic in the mid 101s. Patient was discharged from the ED.  Today, patient continues to be the same as above. She has obvious obstruction as she sleeps concerning for OSA. Her vitals show she is hypertensive, tachycardic and hypoxic. This improves with O2 via [place]. Exam is remarkable for  confusion, although she is oriented x 3 and lethargy. HR is tachycardic, lungs have scattered expiratory wheezes and mild inspiratory crackles in bases. Abdomen is completely soft and non-tender without guarding. There is trace pitting edema in lower extremities.   The above information and exam is concerning for CO2 Narcosis with hypoxia from obesity hypoventilation versus another underlying respiratory process driving hypoxia (COPD). I doubt gabapentin is causing all of her lethargy since she has been taking it since March. However, I would d/c this as well. We will need to be careful with O2 supplementation to not worsen her CO2, keep sats 88-92%. I will order preliminary labs, CXR, ABG, and EKG to start with. CBG 381.  Review of Systems  General: Admits to fatigue, decreased appetite. Denies fever, chills, and diaphoresis.  Respiratory: Admits to SOB, cough. Denies. DOE, chest tightness, and wheezing.   Cardiovascular: Denies chest pain and palpitations.  Gastrointestinal: Admits to nausea, vomiting, abdominal pain. Denies diarrhea, constipation, blood in stool and abdominal distention.  Genitourinary: Denies dysuria, urgency, frequency, hematuria, suprapubic pain and flank pain. Musculoskeletal: Admits to chronic back pain. Denies myalgias Neurological: Denies dizziness, headaches, weakness, lightheadedness, numbness, and syncope,     Objective:   Physical Exam Filed Vitals:   02/10/16 1346  BP: 151/110  Pulse: 62  Temp: 98.3 F (36.8 C)  TempSrc: Oral  Weight: 235 lb 12.8 oz (106.958 kg)  SpO2: 78%   General: Vital signs reviewed.  Patient is lethargic, in no acute distress and cooperative with exam.  Cardiovascular: Tachycardic, regular rhythm, S1 normal,  S2 normal, no murmurs, gallops, or rubs. Pulmonary/Chest: Scattered expiratory wheezes, mild inspiratory crackles in lower lung fields. Obstructs her airway while asleep. No rhonchi. Abdominal: Soft, obese, non-tender,  non-distended, BS +, no masses, organomegaly, or guarding present.  Extremities: Trace lower extremity edema bilaterally Neurological: Lethargic, somnolent. A&O x3, Strength is normal and symmetric bilaterally, cranial nerve II-XII are grossly intact, no focal motor deficit, sensory intact to light touch bilaterally.  Skin: Warm, dry and intact.  Psychiatric: Cognition and memory are abnormal.      Assessment & Plan:   Please see problem based assessment and plan.

## 2016-02-10 NOTE — Progress Notes (Signed)
Patient presented to the clinic for follow-up appointment. Patient was also scheduled to meet with pharmacy team to discuss current medication regimen. Of note, patients medical record noted patient being on both lisinopril and amlodipine/valsartan. Lisinopril was an older prescription that was supposed to be discontinued. We removed this from the patient's medication list.   Upon arrival to the clinic, RN noted patient to be very somnolent and unable to stay awake. O2 sat was measured and resulted as 70%. Pharmacy team was unable to have a full appointment with patient to discuss medication but we were able to speak to the patient's daughter who also brought in all of the patient's medications. Medication bag contained both lisinopril and amlodipine/valsartan. Pharmacy team spoke with the daughter about this and removed and discarded lisinopril prescription.   Due to patient's current somnolent state and decreased O2 sats, patient is going to be admitted. Will follow patients medications via the inpatient setting.   [place] Troia C. Lennox Grumbles, PharmD Pharmacy Resident  Pager: [phone] 02/10/2016 2:46 PM

## 2016-02-10 NOTE — Progress Notes (Addendum)
I saw and evaluated the patient.  I personally confirmed the key portions of Dr. Eilleen Kempf history and exam and reviewed pertinent patient test results.  The assessment, diagnosis, and plan were formulated together and I agree with the documentation in the resident's note.  When I examined the patient she was lying on the exam table asleep.  She was snoring and had several obstructive episodes during my time in the room.  These were accompanied by intermittent tachycardias and consistent with obstructive sleep apnea.  I suspect this may be contributing to her somnolence.  When she was ventilating she was moving decent air with no significant expiratory wheezes.  1 liter ABG was notable for the following: 7.35/49/42.  I was anticipating a more significant elevation in the PaCO2 as obesity hypoventilation with CO2 narcosis was my working diagnosis until this result came back.  She does have a chronic respiratory acidosis, but there is not an acute component to it.  More striking is her significant hypoxemia.  She has an A-a gradient of approximately 50 suggesting a potential pulmonary or cardiac process causing this hypoxemia.  As a shunt study is not w/o risk given her chronic hypoventilation I agree with Dr. Eilleen Kempf plan to obtain a CXR to look for potential infiltrates.  If that is negative for an obvious cause we can consider assessing her modified Geneva score with updated vitals and proceed to a CT angiogram if appropriate.  If there is still no explanation for her hypoxemia a bubble Echocardiogram may be appropriate to look for a shunt as we can not safely perform the usual shunt study to confirm the presence of a shunt.  She is being admitted to the Internal Medicine Teaching Service for further evaluation and care.

## 2016-02-10 NOTE — Progress Notes (Signed)
Attempted ultra sound PIV, attempted x1 without success. Veins are to deep to place 1.88" IV. I am second VAS RN to attempt. Recommend CVC line be placed.

## 2016-02-10 NOTE — Progress Notes (Signed)
RT has placed patient on 6l [place], patient now a little more awake breathing 12-16x min able to open eyes and answer brief questions and cough on command.  Patient still with jerky motions intermittently.  Placed on NRB because PO2 40s on RA.  Family at bedside.  Patient admitted to 2c07.  Decreased O2 to 4l [place] after arrival on unit.  RN at bedside to accept patient.  RN to call if assistance needed.

## 2016-02-10 NOTE — H&P (Signed)
Date: 02/10/2016               Patient Name:  [name] MRN: [id number]  DOB: 14-Oct-1950 Age / Sex: 65 y.o., female   PCP: Florinda Marker, MD         Medical Service: Internal Medicine Teaching Service         Attending Physician: Dr. Annia Belt, MD    First Contact: Dr. Burgess Estelle Pager: [phone]  Second Contact: Dr. Randell Patient Pager: [phone]       After Hours (After 5p/  First Contact Pager: [phone]  weekends / holidays): Second Contact Pager: [phone]   Chief Complaint: altered mental status  History of Present Illness:   [name], a 65 yo woman with combined systolic and diastolic HF with last echo in 2015 EF 40-45% with an AICD in place followed by Dr [facility], HTN, uncontrolled IDDM with last A1c 13, COPD on 2L o2 at home at night, possible OSA, chronic back pain from spinal stenosis, who had come in to the clinic with follow up for T2DM.   IT was decided to admit her because int he clinic, pt was very somnolent, lethargic and confused and kept falling asleep in the exam and daughter was present in the room.  She says that patient was her normal self past Thu, but on Friday, she was confused, somnolent and lethargic which was not usual for her. She took her to the ER, where they did a code stroke, but neurologic exam was entirely unremarkable. A CBC, BMET, UA, UDS, Ct abd/pelvis, and abd [place] were normal. She was discharged with zofran and suggested that she should stop taking gabapentin on discharge. She takes about 300 mg of gabapentin at night, and the dose has notbeen changed. Last refill was in February.  On Saturday, and Sunday, she continued to be like that, and Sunday daughter took her to [place], where work up was normal including a TVUS, pt found to be hypoxic in the 51s.  Daughter says that she noticed a big difference between Monday and Tuesday and yesterday pt was not talking and kept sleeping.   Daughter says that patient was eating normally int he  last 2 days. She denies any weakness or numbness. Pt denies fevers or chills. She denies bladder or bowel incontinence or any seizure like activity. Pt has never been diagnosed with OSA and does not use CPAP. She says that she has noticed a change in patient's gait and gait was very imbalanced.  Daughter says pt has not consumed any alcohol in the last week or used any illicit drugs. Pt smoked about 1 ppd with 40 pack year smoking history but has not smoked in last 2 days.   Meds: Current Facility-Administered Medications  Medication Dose Route Frequency Provider Last Rate Last Dose  . acetaminophen (TYLENOL) tablet 650 mg  650 mg Oral Q6H PRN Milagros Loll, MD       Or  . acetaminophen (TYLENOL) suppository 650 mg  650 mg Rectal Q6H PRN Milagros Loll, MD      . albuterol (PROVENTIL) (2.5 MG/3ML) 0.083% nebulizer solution 2.5 mg  2.5 mg Nebulization Q2H PRN Milagros Loll, MD      . Derrill Memo ON 02/11/2016] aspirin EC tablet 81 mg  81 mg Oral Daily Milagros Loll, MD      . atorvastatin (LIPITOR) tablet 80 mg  80 mg Oral QHS Milagros Loll, MD      .  enoxaparin (LOVENOX) injection 40 mg  40 mg Subcutaneous Q24H Milagros Loll, MD      . sodium chloride flush (NS) 0.9 % injection 3 mL  3 mL Intravenous Q12H Milagros Loll, MD      . Derrill Memo ON 02/11/2016] tiotropium (SPIRIVA) inhalation capsule 18 mcg  18 mcg Inhalation Daily Milagros Loll, MD        Allergies: Allergies as of 02/10/2016 - Review Complete 02/10/2016  Allergen Reaction Noted  . Actos [pioglitazone] Other (See Comments) 03/12/2009  . Naproxen Other (See Comments) 02/05/2016  . Avandia [rosiglitazone maleate] Other (See Comments) 04/13/2011  . Hydrocortisone Other (See Comments) 10/06/2009   Past Medical History  Diagnosis Date  . Hyperthyroidism, subclinical   . Post menopausal syndrome   . Obesity   . Cardiac defibrillator in situ     Atlas II VR (SJM) implanted by Dr Lovena Le  . Eczema   . Lipoma   .  Chronic ulcer of leg ([place])     04-09-15 resolved-not a problem.  . Hyperlipidemia   . Chronic combined systolic and diastolic heart failure (HCC)     a. EF 35-40% in past;  b. Echo 7/13:  EF 45-50%, Gr 2 diast dysfn, mild AI, mild MAC, trivial MR, mild LAE, PASP 47.  [name] NICM (nonischemic cardiomyopathy) ([place])   . Diabetes mellitus   . HTN (hypertension)   . Elevated alkaline phosphatase level     GGT and 5'nucleotidase 8/13 normal  . Hx of cardiac cath     a. [place] 2003 normal;  b. LHC 6/13:  Mild calcification in the LM, o/w normal coronary arteries, EF 45%.   . Sleep apnea     pt denies 04/12/2013  . COPD (chronic obstructive pulmonary disease) (HCC)     O2 at night  . Automatic implantable cardioverter-defibrillator in situ   . CHF (congestive heart failure) ([place])   . Depression   . Implantable cardioverter-defibrillator (ICD) generator end of life   . Colon polyps 04/12/2013    Rectosigmoid polyp  . History of oxygen administration     oxygen @ 2 l/m nasally bedtime 24/7  . ATN (acute tubular necrosis) ([place]) 07/15/2014   Past Surgical History  Procedure Laterality Date  . Cardiac defibrillator placement  05/04/2007    SJM Atlas II VR ICD  . Hysteroscopy    . Cardiac defibrillator placement    . Abdominal hysterectomy    . Cardiac catheterization    . Insert / replace / remove pacemaker    . Tubal ligation    . Hernia repair    . Colonoscopy N/A 04/12/2013    Procedure: COLONOSCOPY;  Surgeon: Beryle Beams, MD;  Location: [facility] ENDOSCOPY;  Service: Endoscopy;  Laterality: N/A;  pt.has defibrilator  . Left heart catheterization with coronary angiogram N/A 04/02/2012    Procedure: LEFT HEART CATHETERIZATION WITH CORONARY ANGIOGRAM;  Surgeon: Hillary Bow, MD;  Location: [facility] CATH LAB;  Service: Cardiovascular;  Laterality: N/A;  . Implantable cardioverter defibrillator (icd) generator change N/A 04/02/2014    Procedure: ICD GENERATOR CHANGE;  Surgeon: Evans Lance, MD;  Location: [facility]  CATH LAB;  Service: Cardiovascular;  Laterality: N/A;  . Esophagogastroduodenoscopy N/A 04/16/2015    Procedure: ESOPHAGOGASTRODUODENOSCOPY (EGD);  Surgeon: [name], MD;  Location: [facility] ENDOSCOPY;  Service: Endoscopy;  Laterality: N/A;  . Colonoscopy N/A 04/16/2015    Procedure: COLONOSCOPY;  Surgeon: [name], MD;  Location: [facility] ENDOSCOPY;  Service: Endoscopy;  Laterality: N/A;  Family History  Problem Relation Age of Onset  . Stroke Mother   . Seizures Father   . Diabetes Sister   . Asthma Maternal Aunt     aunts  . Asthma Maternal Uncle     uncles  . Heart disease Father   . Heart disease Paternal Aunt     aunts  . Heart disease Paternal Uncle     uncles  . Heart disease Maternal Aunt     aunts  . Heart disease Maternal Uncle     uncles  . Heart disease Maternal Grandfather   . Colon cancer Neg Hx   . Colon polyps Neg Hx   . Esophageal cancer Neg Hx   . Kidney disease Neg Hx   . Gallbladder disease Neg Hx    Social History   Social History  . Marital Status: Widowed    Spouse Name: Alroy Dust  . Number of Children: 3  . Years of Education: 11   Occupational History  . disabled    Social History Main Topics  . Smoking status: Current Some Day Smoker -- 0.20 packs/day for 35 years    Types: Cigarettes  . Smokeless tobacco: Never Used     Comment: Trying to quit.  . Alcohol Use: No  . Drug Use: No  . Sexual Activity: Not on file   Other Topics Concern  . Not on file   Social History Narrative   ** Merged History Encounter **       Married    Review of Systems: Pertinent items noted in HPI and remainder of comprehensive ROS otherwise negative.  Physical Exam: There were no vitals taken for this visit.  General:  Somnolent,  obese, on nonrebreather, follows commands HEENT: PERRLA, no ear/nose/throat erythema or exudates Neck: supple, no lymphadenopathy CV: Regular rate, regular rhythm, no murmurs or rubs appreciated. Resp: equal and  symmetric breath sounds, no wheezing or crackles. Abdomen: soft, obese, nondistended, +BS in all 4 quadrants Skin: warm, dry, intact, no open lesions or atypical rash noted Extremities: pulses intact b/l, 1+ pitting edema Neurologic: Patient is alert and oriented x3, and no gross deficits noted  Neurologic exam: Peggy: A&O to person, time, place, and president but pt somnolent  CN II-XII grossly intact DTRs: 2+ and symmetric in the knee Motor: 5/5 strength in upper, 5/5 in lower extremities, normal muscle tone Cerebellar: normal FNF     Lab results Results for orders placed or performed during the hospital encounter of 02/10/16 (from the past 24 hour(s))  CBC     Status: Abnormal   Collection Time: 02/10/16  4:09 PM  Result Value Ref Range   WBC 15.3 (H) 4.0 - 10.5 K/uL   RBC 5.26 (H) 3.87 - 5.11 MIL/uL   Hemoglobin 13.5 12.0 - 15.0 g/dL   HCT 41.7 36.0 - 46.0 %   MCV 79.3 78.0 - 100.0 fL   MCH 25.7 (L) 26.0 - 34.0 pg   MCHC 32.4 30.0 - 36.0 g/dL   RDW 14.9 11.5 - 15.5 %   Platelets 175 150 - 400 K/uL  Comprehensive metabolic panel     Status: Abnormal   Collection Time: 02/10/16  4:09 PM  Result Value Ref Range   Sodium 131 (L) 135 - 145 mmol/L   Potassium 4.1 3.5 - 5.1 mmol/L   Chloride 92 (L) 101 - 111 mmol/L   CO2 26 22 - 32 mmol/L   Glucose, Bld 369 (H) 65 - 99 mg/dL   BUN 42 (  H) 6 - 20 mg/dL   Creatinine, Ser 3.94 (H) 0.44 - 1.00 mg/dL   Calcium 8.2 (L) 8.9 - 10.3 mg/dL   Total Protein 7.0 6.5 - 8.1 g/dL   Albumin 3.0 (L) 3.5 - 5.0 g/dL   AST 15 15 - 41 U/L   ALT 18 14 - 54 U/L   Alkaline Phosphatase 183 (H) 38 - 126 U/L   Total Bilirubin 1.2 0.3 - 1.2 mg/dL   GFR calc non Af Amer 11 (L) >60 mL/min   GFR calc Af Amer 13 (L) >60 mL/min   Anion gap 13 5 - 15  Magnesium     Status: None   Collection Time: 02/10/16  4:09 PM  Result Value Ref Range   Magnesium 1.8 1.7 - 2.4 mg/dL  Phosphorus     Status: None   Collection Time: 02/10/16  4:09 PM  Result Value Ref  Range   Phosphorus 3.7 2.5 - 4.6 mg/dL  TSH     Status: None   Collection Time: 02/10/16  4:09 PM  Result Value Ref Range   TSH 0.603 0.350 - 4.500 uIU/mL  Troponin I     Status: Abnormal   Collection Time: 02/10/16  4:09 PM  Result Value Ref Range   Troponin I 0.05 (H) <0.031 ng/mL     Imaging results:  No results found.  Other results: EKG: sinus tachycardia Assessment & Plan by Problem: Active Problems:   Acute encephalopathy  Acute encephalopathy: Pt with 3 day history of somnolence increased since yesterday.  On our exam, pt was very somnolent but able to say her name, place, date, and was able to follow commands. I do not believe gabapentin is causing her lethargy since this is not a new med. CBG was 381 which rules out hypoglycemia. Pt's daughter denies any seizure or b/b incontinence.  Her ABG was notable for pH of 7.35, pCO2 of 48, and pO2 of 42 which shows chronic respiratory acidosis, but CO2 would have been more markedly elevated if she were to have obesity hypoventilation syndrome. She has an A-a gradient of 50 suggesting pulmonary or cardiac etiology.  If her CXR is negative, then we can obtain CT angiogram. CXR showing cardiomegaly with increased central venous congestion consistent with CHF. Also, her BUN is 42 and SCr is 3.94, with baseline Cr of 0.7, so her increased BUN could be making her encephalopathic.  WC of 15.3. Alk phos 183. GGT pending.  -tele -keep O2 sats 88-92% -CBC, CMET, serum cr -MRSA PCR -daily weights and I&O -VQ scan ordered -istat troponin -f/u diff, GGT  AKI or possibly ATN: Cr of 3.94, baseline around 0.9 -ordered FeNa (urine sodium and UCr), and for FeUrea -ordered UA -renal ultrasound  -gently hydration 100 cc/hr    HFrEF:  Has an AICD in place. On 40 mg daily lasix. CXR showing vascular congestion  -strict I&O -daily weights   COPD: PFTs in Sept 2016 showing reduced FEV1 and increased FEV1 to FVC ratio.   -spiriva -albuterol   T2DM: A1c > 13. CBG > 300 this afternoon. Is on home metformin 1000 bid, and lantus 60 units qHS  -SSI-M -Lantus 10 units   HTN: on lisinopril 40 mg, toprol xl 50 mg daily and amlodipine 10 mg daily  -holding for now , most recent BP 100/62   Chronic pain -holding gabapentin  HLD: -statin  OSA: may need sleep study    DVT ppx: lovenox    Dispo: Disposition is deferred at  this time, awaiting improvement of current medical problems. Anticipated discharge in approximately 2 day(s).   The patient does have a current PCP (Alexa Angela Burke, MD) and does not need an [facility] hospital follow-up appointment after discharge.  The patient does not have transportation limitations that hinder transportation to clinic appointments.  Signed: Burgess Estelle, MD 02/10/2016, 4:15 PM

## 2016-02-10 NOTE — Addendum Note (Signed)
Addended by: Ebbie Latus on: 02/10/2016 03:05 PM   Modules accepted: Orders

## 2016-02-10 NOTE — Addendum Note (Signed)
Addended by: Oval Linsey D on: 02/10/2016 05:14 PM   Modules accepted: Level of Service

## 2016-02-11 ENCOUNTER — Observation Stay (HOSPITAL_BASED_OUTPATIENT_CLINIC_OR_DEPARTMENT_OTHER): Payer: Medicare Other

## 2016-02-11 ENCOUNTER — Observation Stay (HOSPITAL_COMMUNITY): Payer: Medicare Other

## 2016-02-11 DIAGNOSIS — G9340 Encephalopathy, unspecified: Secondary | ICD-10-CM | POA: Diagnosis not present

## 2016-02-11 DIAGNOSIS — N179 Acute kidney failure, unspecified: Secondary | ICD-10-CM | POA: Insufficient documentation

## 2016-02-11 DIAGNOSIS — R509 Fever, unspecified: Secondary | ICD-10-CM | POA: Diagnosis not present

## 2016-02-11 DIAGNOSIS — I509 Heart failure, unspecified: Secondary | ICD-10-CM | POA: Diagnosis not present

## 2016-02-11 DIAGNOSIS — E1165 Type 2 diabetes mellitus with hyperglycemia: Secondary | ICD-10-CM

## 2016-02-11 DIAGNOSIS — R0902 Hypoxemia: Secondary | ICD-10-CM | POA: Diagnosis not present

## 2016-02-11 DIAGNOSIS — G8929 Other chronic pain: Secondary | ICD-10-CM

## 2016-02-11 DIAGNOSIS — I429 Cardiomyopathy, unspecified: Secondary | ICD-10-CM | POA: Diagnosis not present

## 2016-02-11 DIAGNOSIS — Z95810 Presence of automatic (implantable) cardiac defibrillator: Secondary | ICD-10-CM

## 2016-02-11 DIAGNOSIS — G4733 Obstructive sleep apnea (adult) (pediatric): Secondary | ICD-10-CM | POA: Diagnosis not present

## 2016-02-11 DIAGNOSIS — E1065 Type 1 diabetes mellitus with hyperglycemia: Secondary | ICD-10-CM | POA: Insufficient documentation

## 2016-02-11 DIAGNOSIS — I5023 Acute on chronic systolic (congestive) heart failure: Secondary | ICD-10-CM | POA: Diagnosis not present

## 2016-02-11 DIAGNOSIS — I501 Left ventricular failure: Secondary | ICD-10-CM | POA: Diagnosis not present

## 2016-02-11 DIAGNOSIS — I5043 Acute on chronic combined systolic (congestive) and diastolic (congestive) heart failure: Secondary | ICD-10-CM

## 2016-02-11 DIAGNOSIS — R5383 Other fatigue: Secondary | ICD-10-CM | POA: Diagnosis not present

## 2016-02-11 DIAGNOSIS — I5042 Chronic combined systolic (congestive) and diastolic (congestive) heart failure: Secondary | ICD-10-CM | POA: Diagnosis not present

## 2016-02-11 DIAGNOSIS — E860 Dehydration: Secondary | ICD-10-CM | POA: Diagnosis not present

## 2016-02-11 DIAGNOSIS — R4182 Altered mental status, unspecified: Secondary | ICD-10-CM | POA: Insufficient documentation

## 2016-02-11 DIAGNOSIS — E785 Hyperlipidemia, unspecified: Secondary | ICD-10-CM

## 2016-02-11 DIAGNOSIS — I248 Other forms of acute ischemic heart disease: Secondary | ICD-10-CM | POA: Diagnosis not present

## 2016-02-11 DIAGNOSIS — I110 Hypertensive heart disease with heart failure: Secondary | ICD-10-CM

## 2016-02-11 DIAGNOSIS — E872 Acidosis: Secondary | ICD-10-CM | POA: Diagnosis not present

## 2016-02-11 DIAGNOSIS — Z79899 Other long term (current) drug therapy: Secondary | ICD-10-CM

## 2016-02-11 DIAGNOSIS — IMO0002 Reserved for concepts with insufficient information to code with codable children: Secondary | ICD-10-CM | POA: Insufficient documentation

## 2016-02-11 DIAGNOSIS — I7389 Other specified peripheral vascular diseases: Secondary | ICD-10-CM | POA: Diagnosis not present

## 2016-02-11 DIAGNOSIS — E1040 Type 1 diabetes mellitus with diabetic neuropathy, unspecified: Secondary | ICD-10-CM | POA: Insufficient documentation

## 2016-02-11 DIAGNOSIS — Z452 Encounter for adjustment and management of vascular access device: Secondary | ICD-10-CM | POA: Insufficient documentation

## 2016-02-11 DIAGNOSIS — I491 Atrial premature depolarization: Secondary | ICD-10-CM | POA: Diagnosis not present

## 2016-02-11 DIAGNOSIS — J9601 Acute respiratory failure with hypoxia: Secondary | ICD-10-CM | POA: Diagnosis not present

## 2016-02-11 DIAGNOSIS — Z9981 Dependence on supplemental oxygen: Secondary | ICD-10-CM | POA: Diagnosis not present

## 2016-02-11 DIAGNOSIS — J449 Chronic obstructive pulmonary disease, unspecified: Secondary | ICD-10-CM | POA: Diagnosis not present

## 2016-02-11 DIAGNOSIS — E0590 Thyrotoxicosis, unspecified without thyrotoxic crisis or storm: Secondary | ICD-10-CM | POA: Diagnosis not present

## 2016-02-11 DIAGNOSIS — R778 Other specified abnormalities of plasma proteins: Secondary | ICD-10-CM | POA: Diagnosis not present

## 2016-02-11 DIAGNOSIS — M549 Dorsalgia, unspecified: Secondary | ICD-10-CM

## 2016-02-11 DIAGNOSIS — Z794 Long term (current) use of insulin: Secondary | ICD-10-CM

## 2016-02-11 LAB — GLUCOSE, CAPILLARY
Glucose-Capillary: 96 mg/dL (ref 65–99)
Glucose-Capillary: 72 mg/dL (ref 65–99)
Glucose-Capillary: 161 mg/dL — ABNORMAL HIGH (ref 65–99)
Glucose-Capillary: 141 mg/dL — ABNORMAL HIGH (ref 65–99)
Glucose-Capillary: 94 mg/dL (ref 65–99)

## 2016-02-11 LAB — ECHOCARDIOGRAM LIMITED BUBBLE STUDY
Height: 60 in
Weight: 3767.22 oz

## 2016-02-11 LAB — URINALYSIS, ROUTINE W REFLEX MICROSCOPIC
Glucose, UA: NEGATIVE mg/dL
Hgb urine dipstick: NEGATIVE
Ketones, ur: NEGATIVE mg/dL
Nitrite: NEGATIVE
Protein, ur: 30 mg/dL — AB
Specific Gravity, Urine: 1.021 (ref 1.005–1.030)
pH: 5.5 (ref 5.0–8.0)

## 2016-02-11 LAB — URINE MICROSCOPIC-ADD ON

## 2016-02-11 LAB — TROPONIN I
Troponin I: 0.17 ng/mL — ABNORMAL HIGH (ref <0.031)
Troponin I: 0.04 ng/mL — ABNORMAL HIGH (ref <0.031)

## 2016-02-11 LAB — CBC
HCT: 39.7 % (ref 36.0–46.0)
Hemoglobin: 12.8 g/dL (ref 12.0–15.0)
MCH: 25.6 pg — ABNORMAL LOW (ref 26.0–34.0)
MCHC: 32.2 g/dL (ref 30.0–36.0)
MCV: 79.4 fL (ref 78.0–100.0)
Platelets: 165 10*3/uL (ref 150–400)
RBC: 5 MIL/uL (ref 3.87–5.11)
RDW: 15 % (ref 11.5–15.5)
WBC: 13.1 10*3/uL — ABNORMAL HIGH (ref 4.0–10.5)

## 2016-02-11 LAB — DIFFERENTIAL
Basophils Absolute: 0 10*3/uL (ref 0.0–0.1)
Basophils Relative: 0 %
Eosinophils Absolute: 1.6 10*3/uL — ABNORMAL HIGH (ref 0.0–0.7)
Eosinophils Relative: 12 %
Lymphocytes Relative: 10 %
Lymphs Abs: 1.4 10*3/uL (ref 0.7–4.0)
Monocytes Absolute: 0.8 10*3/uL (ref 0.1–1.0)
Monocytes Relative: 6 %
Neutro Abs: 9.9 10*3/uL — ABNORMAL HIGH (ref 1.7–7.7)
Neutrophils Relative %: 72 %

## 2016-02-11 LAB — BASIC METABOLIC PANEL
Anion gap: 11 (ref 5–15)
BUN: 40 mg/dL — ABNORMAL HIGH (ref 6–20)
CO2: 29 mmol/L (ref 22–32)
Calcium: 8.3 mg/dL — ABNORMAL LOW (ref 8.9–10.3)
Chloride: 96 mmol/L — ABNORMAL LOW (ref 101–111)
Creatinine, Ser: 2.61 mg/dL — ABNORMAL HIGH (ref 0.44–1.00)
GFR calc Af Amer: 21 mL/min — ABNORMAL LOW (ref >60)
GFR calc non Af Amer: 19 mL/min — ABNORMAL LOW (ref >60)
Glucose, Bld: 161 mg/dL — ABNORMAL HIGH (ref 65–99)
Potassium: 3.5 mmol/L (ref 3.5–5.1)
Sodium: 136 mmol/L (ref 135–145)

## 2016-02-11 LAB — CARBOXYHEMOGLOBIN
Carboxyhemoglobin: 1.4 % (ref 0.5–1.5)
Methemoglobin: 0.9 % (ref 0.0–1.5)
O2 Saturation: 73.8 %
Total hemoglobin: 12.8 g/dL (ref 12.0–16.0)

## 2016-02-11 LAB — POCT I-STAT 3, ART BLOOD GAS (G3+)
Acid-Base Excess: 2 mmol/L (ref 0.0–2.0)
Bicarbonate: 30.1 mEq/L — ABNORMAL HIGH (ref 20.0–24.0)
O2 Saturation: 91 %
TCO2: 32 mmol/L (ref 0–100)
pCO2 arterial: 59.8 mmHg (ref 35.0–45.0)
pH, Arterial: 7.31 — ABNORMAL LOW (ref 7.350–7.450)
pO2, Arterial: 69 mmHg — ABNORMAL LOW (ref 80.0–100.0)

## 2016-02-11 LAB — BRAIN NATRIURETIC PEPTIDE: B Natriuretic Peptide: 25 pg/mL (ref 0.0–100.0)

## 2016-02-11 LAB — LACTIC ACID, PLASMA: Lactic Acid, Venous: 0.8 mmol/L (ref 0.5–2.0)

## 2016-02-11 LAB — PROCALCITONIN: Procalcitonin: 0.68 ng/mL

## 2016-02-11 LAB — SODIUM, URINE, RANDOM: Sodium, Ur: 48 mmol/L

## 2016-02-11 LAB — VITAMIN B12: Vitamin B-12: 348 pg/mL (ref 180–914)

## 2016-02-11 LAB — CREATININE, URINE, RANDOM: Creatinine, Urine: 270.09 mg/dL

## 2016-02-11 LAB — MRSA PCR SCREENING: MRSA by PCR: NEGATIVE

## 2016-02-11 LAB — GAMMA GT: GGT: 24 U/L (ref 7–50)

## 2016-02-11 LAB — AMMONIA: Ammonia: 30 umol/L (ref 9–35)

## 2016-02-11 MED ORDER — LEVALBUTEROL HCL 0.63 MG/3ML IN NEBU
0.6300 mg | INHALATION_SOLUTION | Freq: Four times a day (QID) | RESPIRATORY_TRACT | Status: DC
  Administered 2016-02-11 – 2016-02-12 (×3): 0.63 mg via RESPIRATORY_TRACT
  Filled 2016-02-11 (×3): qty 3

## 2016-02-11 MED ORDER — ENOXAPARIN SODIUM 100 MG/ML ~~LOC~~ SOLN
100.0000 mg | SUBCUTANEOUS | Status: DC
  Administered 2016-02-11: 100 mg via SUBCUTANEOUS
  Filled 2016-02-11: qty 1

## 2016-02-11 MED ORDER — ENOXAPARIN SODIUM 30 MG/0.3ML ~~LOC~~ SOLN: 30.0000 mg | SUBCUTANEOUS | Status: DC

## 2016-02-11 MED ORDER — TECHNETIUM TC 99M DIETHYLENETRIAME-PENTAACETIC ACID: 31.7000 | Freq: Once | INTRAVENOUS | Status: DC | PRN

## 2016-02-11 MED ORDER — TECHNETIUM TO 99M ALBUMIN AGGREGATED
4.1900 | Freq: Once | INTRAVENOUS | Status: AC | PRN
  Administered 2016-02-11: 4 via INTRAVENOUS

## 2016-02-11 MED ORDER — SODIUM CHLORIDE 0.9 % IV BOLUS (SEPSIS)
500.0000 mL | Freq: Once | INTRAVENOUS | Status: AC
  Administered 2016-02-11: 500 mL via INTRAVENOUS

## 2016-02-11 MED ORDER — IPRATROPIUM BROMIDE 0.02 % IN SOLN
0.5000 mg | Freq: Four times a day (QID) | RESPIRATORY_TRACT | Status: DC
  Administered 2016-02-11 – 2016-02-12 (×3): 0.5 mg via RESPIRATORY_TRACT
  Filled 2016-02-11 (×3): qty 2.5

## 2016-02-11 MED ORDER — METOPROLOL SUCCINATE ER 50 MG PO TB24
50.0000 mg | ORAL_TABLET | Freq: Every day | ORAL | Status: DC
  Administered 2016-02-11 – 2016-02-13 (×3): 50 mg via ORAL
  Filled 2016-02-11: qty 1
  Filled 2016-02-11 (×2): qty 2

## 2016-02-11 NOTE — Progress Notes (Signed)
ANTICOAGULATION CONSULT NOTE - Initial Consult  Pharmacy Consult for Enoxaparin Indication: pulmonary embolus  Allergies  Allergen Reactions  . Actos [Pioglitazone] Other (See Comments)    REACTION: congestive heart failure   . Naproxen Other (See Comments)    500mg  dose made her sleep for two days  . Avandia [Rosiglitazone Maleate] Other (See Comments)    unknown  . Hydrocortisone Other (See Comments)    unknown    Patient Measurements: Height: 5' (152.4 cm) Weight: 235 lb 7.2 oz (106.8 kg) IBW/kg (Calculated) : 45.5  Vital Signs: Temp: 100.2 F (37.9 C) (05/11 0520) Temp Source: Oral (05/11 0520) BP: 130/71 mmHg (05/11 0520) Pulse Rate: 115 (05/10 2200)  Labs:  Recent Labs  02/10/16 1609 02/11/16 0005  HGB 13.5 12.8  HCT 41.7 39.7  PLT 175 165  CREATININE 3.94* 2.61*  TROPONINI 0.05* 0.17*    Estimated Creatinine Clearance: 24.7 mL/min (by C-G formula based on Cr of 2.61).   Medical History: Past Medical History  Diagnosis Date  . Hyperthyroidism, subclinical   . Post menopausal syndrome   . Obesity   . Cardiac defibrillator in situ     Atlas II VR (SJM) implanted by Dr Lovena Le  . Eczema   . Lipoma   . Chronic ulcer of leg ([place])     04-09-15 resolved-not a problem.  . Hyperlipidemia   . Chronic combined systolic and diastolic heart failure (HCC)     a. EF 35-40% in past;  b. Echo 7/13:  EF 45-50%, Gr 2 diast dysfn, mild AI, mild MAC, trivial MR, mild LAE, PASP 47.  [name] NICM (nonischemic cardiomyopathy) ([place])   . Diabetes mellitus   . HTN (hypertension)   . Elevated alkaline phosphatase level     GGT and 5'nucleotidase 8/13 normal  . Hx of cardiac cath     a. [place] 2003 normal;  b. LHC 6/13:  Mild calcification in the LM, o/w normal coronary arteries, EF 45%.   . Sleep apnea     pt denies 04/12/2013  . COPD (chronic obstructive pulmonary disease) (HCC)     O2 at night  . Automatic implantable cardioverter-defibrillator in situ   . CHF (congestive  heart failure) ([place])   . Depression   . Implantable cardioverter-defibrillator (ICD) generator end of life   . Colon polyps 04/12/2013    Rectosigmoid polyp  . History of oxygen administration     oxygen @ 2 l/m nasally bedtime 24/7  . ATN (acute tubular necrosis) ([place]) 07/15/2014    Assessment: 65 yo F presents on 5/10 with AMS. Worried about PE in patient so pharmacy consulted to start enoxaparin for PE treatment. Last 30mg  Lovenox dose was at midnight this morning. Hgb and plts stable. No s/s of bleed. SCr elevated but down to 2.61 today, CrCl ~57ml/min.  Goal of Therapy:  Monitor platelets by anticoagulation protocol: Yes   Plan:  Stop enoxaparin 30mg  Start enoxaparin 100mg  [place] Q24 (May need to adjust to Q12 tomorrow) Monitor CBC, s/s of bleed  Elenor Quinones, PharmD, BCPS Clinical Pharmacist Pager [phone] 02/11/2016 7:41 AM

## 2016-02-11 NOTE — ED Provider Notes (Signed)
CSN: [id number]     Arrival date & time 02/05/16  1151 History   First MD Initiated Contact with Patient 02/05/16 1155     Chief Complaint  Patient presents with  . Emesis  . Abdominal Pain  . Back Pain     (Consider location/radiation/quality/duration/timing/severity/associated sxs/prior Treatment) HPI Comments: Pt comes in with cc of back pain, abd pain, emesis. She has hx of COPD, NICM, DM, HTN. No hx of strokes. Reports that her back pain is not new - just worse than usual. Her pain is in the lumbar spine region. No associated new numbness, weakness, urinary incontinence, urinary retention, bowel incontinence. Pt also has bee having some abd pain, with nausea and emesis - the latter being worse today. Pt is feeling unwell relative to usual and weaker. She denies any diarrhea, or fevers at home.   Pt was activated as CODE STROKE on the field, but the neuro team had cancelled it when she arrived. Pt denies any focal weakness on one sire, numbmess on one side.   ROS 10 Systems reviewed and are negative for acute change except as noted in the HPI.       Patient is a 65 y.o. female presenting with vomiting, abdominal pain, and back pain. The history is provided by the patient.  Emesis Associated symptoms: abdominal pain   Abdominal Pain Associated symptoms: vomiting   Back Pain Associated symptoms: abdominal pain     Past Medical History  Diagnosis Date  . Hyperthyroidism, subclinical   . Post menopausal syndrome   . Obesity   . Cardiac defibrillator in situ     Atlas II VR (SJM) implanted by Dr Lovena Le  . Eczema   . Lipoma   . Chronic ulcer of leg ([place])     04-09-15 resolved-not a problem.  . Hyperlipidemia   . Chronic combined systolic and diastolic heart failure (HCC)     a. EF 35-40% in past;  b. Echo 7/13:  EF 45-50%, Gr 2 diast dysfn, mild AI, mild MAC, trivial MR, mild LAE, PASP 47.  [name] NICM (nonischemic cardiomyopathy) ([place])   . Diabetes mellitus   . HTN  (hypertension)   . Elevated alkaline phosphatase level     GGT and 5'nucleotidase 8/13 normal  . Hx of cardiac cath     a. [place] 2003 normal;  b. [facility] 6/13:  Mild calcification in the LM, o/w normal coronary arteries, EF 45%.   . Sleep apnea     pt denies 04/12/2013  . COPD (chronic obstructive pulmonary disease) (HCC)     O2 at night  . Automatic implantable cardioverter-defibrillator in situ   . CHF (congestive heart failure) ([place])   . Depression   . Implantable cardioverter-defibrillator (ICD) generator end of life   . Colon polyps 04/12/2013    Rectosigmoid polyp  . History of oxygen administration     oxygen @ 2 l/m nasally bedtime 24/7  . ATN (acute tubular necrosis) ([place]) 07/15/2014   Past Surgical History  Procedure Laterality Date  . Cardiac defibrillator placement  05/04/2007    SJM Atlas II VR ICD  . Hysteroscopy    . Cardiac defibrillator placement    . Abdominal hysterectomy    . Cardiac catheterization    . Insert / replace / remove pacemaker    . Tubal ligation    . Hernia repair    . Colonoscopy N/A 04/12/2013    Procedure: COLONOSCOPY;  Surgeon: Beryle Beams, MD;  Location: [facility] ENDOSCOPY;  Service: Endoscopy;  Laterality: N/A;  pt.has defibrilator  . Left heart catheterization with coronary angiogram N/A 04/02/2012    Procedure: LEFT HEART CATHETERIZATION WITH CORONARY ANGIOGRAM;  Surgeon: Hillary Bow, MD;  Location[facility] CATH LAB;  Service: Cardiovascular;  Laterality: N/A;  . Implantable cardioverter defibrillator (icd) generator change N/A 04/02/2014    Procedure: ICD GENERATOR CHANGE;  Surgeon: Evans Lance, MD;  Location: [facility] CATH LAB;  Service: Cardiovascular;  Laterality: N/A;  . Esophagogastroduodenoscopy N/A 04/16/2015    Procedure: ESOPHAGOGASTRODUODENOSCOPY (EGD);  Surgeon: [name], MD;  Location: [facility];  Service: Endoscopy;  Laterality: N/A;  . Colonoscopy N/A 04/16/2015    Procedure: COLONOSCOPY;  Surgeon: [name], MD;  Location:  [facility] ENDOSCOPY;  Service: Endoscopy;  Laterality: N/A;   Family History  Problem Relation Age of Onset  . Stroke Mother   . Seizures Father   . Diabetes Sister   . Asthma Maternal Aunt     aunts  . Asthma Maternal Uncle     uncles  . Heart disease Father   . Heart disease Paternal Aunt     aunts  . Heart disease Paternal Uncle     uncles  . Heart disease Maternal Aunt     aunts  . Heart disease Maternal Uncle     uncles  . Heart disease Maternal Grandfather   . Colon cancer Neg Hx   . Colon polyps Neg Hx   . Esophageal cancer Neg Hx   . Kidney disease Neg Hx   . Gallbladder disease Neg Hx    Social History  Substance Use Topics  . Smoking status: Current Some Day Smoker -- 0.20 packs/day for 35 years    Types: Cigarettes  . Smokeless tobacco: Never Used     Comment: Trying to quit.  . Alcohol Use: No   OB History    No data available     Review of Systems  Gastrointestinal: Positive for vomiting and abdominal pain.  Musculoskeletal: Positive for back pain.      Allergies  Actos; Naproxen; Avandia; and Hydrocortisone  Home Medications   Prior to Admission medications   Medication Sig Start Date End Date Taking? Authorizing Provider  albuterol (PROVENTIL HFA;VENTOLIN HFA) 108 (90 Base) MCG/ACT inhaler Inhale 2 puffs into the lungs every 6 (six) hours as needed for wheezing or shortness of breath. 11/13/15  Yes Alexa Angela Burke, MD  albuterol (PROVENTIL) (2.5 MG/3ML) 0.083% nebulizer solution USE 1 VIAL VIA NEBULIZER EVERY 6 HOURS AS NEEDED FOR WHEEZING OR SHORTNESS OF BREATH 11/13/15  Yes Alexa Angela Burke, MD  amLODipine-olmesartan (AZOR) 10-40 MG tablet Take 1 tablet by mouth daily. 12/07/15  Yes Alexa Angela Burke, MD  aspirin EC 81 MG tablet Take 1 tablet (81 mg total) by mouth daily. 11/13/15  Yes Alexa Angela Burke, MD  atorvastatin (LIPITOR) 80 MG tablet Take 1 tablet (80 mg total) by mouth at bedtime. 11/13/15  Yes Alexa Angela Burke, MD  furosemide (LASIX) 40 MG tablet Take 1  tablet (40 mg total) by mouth daily. Place Rx on file (filled Rx on 11/11/15) 11/13/15  Yes Alexa Angela Burke, MD  gabapentin (NEURONTIN) 300 MG capsule Take 2 capsules (600 mg total) by mouth 3 (three) times daily. Place Rx on file (picked up Rx on 11/11/15) Patient taking differently: Take 300 mg by mouth at bedtime. Place Rx on file (picked up Rx on 11/11/15) 11/13/15  Yes Alexa Angela Burke, MD  insulin glargine (LANTUS) 100 UNIT/ML injection  Inject 0.6 mLs (60 Units total) into the skin daily. 11/21/15  Yes Alexa Angela Burke, MD  insulin lispro (HUMALOG KWIKPEN) 100 UNIT/ML KiwkPen Inject 0.1 mLs (10 Units total) into the skin 3 (three) times daily. 12/09/15  Yes Alexa Angela Burke, MD  metFORMIN (GLUCOPHAGE-XR) 500 MG 24 hr tablet Take 4 tablets (2,000 mg total) by mouth daily with breakfast. 12/09/15  Yes Alexa Angela Burke, MD  metoprolol succinate (TOPROL-XL) 50 MG 24 hr tablet Take 1 tablet (50 mg total) by mouth daily. 11/13/15  Yes Alexa Angela Burke, MD  tiotropium (SPIRIVA) 18 MCG inhalation capsule Place 1 capsule (18 mcg total) into inhaler and inhale daily. 11/13/15  Yes Alexa Angela Burke, MD  glucose blood (ONE TOUCH ULTRA TEST) test strip Use as instructed by doctor up to 3x daily,dx code 250.02 insulin requiring 11/13/15   Alexa Angela Burke, MD  Insulin Syringe-Needle U-100 31G X 5/16" 1 ML MISC Use to give Lantus Insulin daily. E11.22. 12/12/15   Florinda Marker, MD  naproxen (NAPROSYN) 500 MG tablet Take 1 tablet (500 mg total) by mouth 2 (two) times daily as needed for moderate pain. 01/28/16   Alexa Angela Burke, MD  nitroGLYCERIN (NITROSTAT) 0.4 MG SL tablet Place 0.4 mg under the tongue every 5 (five) minutes as needed for chest pain.    Historical Provider, MD  omeprazole (PRILOSEC) 20 MG capsule Take 1 capsule (20 mg total) by mouth 2 (two) times daily. 02/05/16   Tanna Furry, MD  ondansetron (ZOFRAN ODT) 4 MG disintegrating tablet Take 1 tablet (4 mg total) by mouth every 8 (eight) hours as needed for nausea. 02/05/16   Tanna Furry, MD    BP 160/86 mmHg  Pulse 96  Temp(Src) 98.5 F (36.9 C) (Oral)  Resp 20  Wt 247 lb 5.7 oz (112.2 kg)  SpO2 92% Physical Exam  Constitutional: She is oriented to person, place, and time. She appears well-developed.  HENT:  Head: Normocephalic and atraumatic.  Eyes: Conjunctivae and EOM are normal. Pupils are equal, round, and reactive to light.  Neck: Normal range of motion. Neck supple. No JVD present.  Cardiovascular: Normal rate, regular rhythm and normal heart sounds.   Pulmonary/Chest: Effort normal and breath sounds normal. No respiratory distress.  Abdominal: Soft. Bowel sounds are normal. She exhibits no distension. There is tenderness. There is guarding. There is no rebound.  PT has generalized abd pain, worse on the R side  Neurological: She is alert and oriented to person, place, and time. No cranial nerve deficit. Coordination normal.  Skin: Skin is warm and dry.    ED Course  Procedures (including critical care time) Labs Review Labs Reviewed  CBC - Abnormal; Notable for the following:    RBC 5.42 (*)    All other components within normal limits  COMPREHENSIVE METABOLIC PANEL - Abnormal; Notable for the following:    Glucose, Bld 322 (*)    Alkaline Phosphatase 142 (*)    All other components within normal limits  URINALYSIS, ROUTINE W REFLEX MICROSCOPIC (NOT AT [facility]) - Abnormal; Notable for the following:    Specific Gravity, Urine 1.031 (*)    Glucose, UA >1000 (*)    Hgb urine dipstick SMALL (*)    Ketones, ur 40 (*)    Protein, ur 100 (*)    All other components within normal limits  URINE MICROSCOPIC-ADD ON - Abnormal; Notable for the following:    Squamous Epithelial / LPF 6-30 (*)    Bacteria, UA FEW (*)  All other components within normal limits  I-STAT CHEM 8, ED - Abnormal; Notable for the following:    Glucose, Bld 309 (*)    Calcium, Ion 1.09 (*)    Hemoglobin 17.0 (*)    HCT 50.0 (*)    All other components within normal limits  ETHANOL   PROTIME-INR  APTT  DIFFERENTIAL  URINE RAPID DRUG SCREEN, HOSP PERFORMED  I-STAT TROPOININ, ED    Imaging Review X-ray Chest Pa And Lateral  02/10/2016  CLINICAL DATA:  Hypoxia and weakness EXAM: CHEST  2 VIEW COMPARISON:  01/03/2016 FINDINGS: LEFT-sided pacemaker with a single lead overlies enlarged cardiac silhouette. There is increased central venous congestion compared to prior. Small pleural effusions noted. IMPRESSION: Cardiomegaly with increased central venous congestion and pleural effusions consistent with congestive heart failure. Electronically Signed   By: Suzy Bouchard M.D.   On: 02/10/2016 18:12   US Renal  02/11/2016  CLINICAL DATA:  Acute kidney injury EXAM: RENAL / URINARY TRACT ULTRASOUND COMPLETE COMPARISON:  None. FINDINGS: Right Kidney: Length: 12.6 cm. Echogenicity within normal limits. No mass or hydronephrosis visualized. Left Kidney: Length: 11.4 cm. Echogenicity within normal limits. No mass or hydronephrosis visualized. Bladder: Appears normal for degree of bladder distention. IMPRESSION: Both kidneys appear normal.  No hydronephrosis. Electronically Signed   By: Andreas Newport M.D.   On: 02/11/2016 01:30   Nm Pulmonary Perf And Vent  02/11/2016  CLINICAL DATA:  Hypoxia EXAM: NUCLEAR MEDICINE VENTILATION - PERFUSION LUNG SCAN TECHNIQUE: Ventilation images were obtained in multiple projections using inhaled aerosol Tc-8m DTPA. Perfusion images were obtained in multiple projections after intravenous injection of Tc-61m MAA. RADIOPHARMACEUTICALS:  31.7 mCi Technetium-36m DTPA aerosol inhalation and 4.19 mCi Technetium-63m MAA IV COMPARISON:  Chest x-ray from the previous day FINDINGS: Ventilation: There is adequate uptake on the ventilation images. Large cardiac shadow is noted. Some mild decreased ventilation is noted in the right middle lobe. Perfusion: Perfusion images demonstrate adequate uptake throughout both lungs with the exception of a area of decreased  perfusion in the right middle lobe. This matches the ventilation images and likely related to focal atelectasis. IMPRESSION: No ventilation-perfusion mismatch to suggest pulmonary embolism is noted. There is a matched defect in the right middle lobe likely related to atelectasis. Electronically Signed   By: Inez Catalina M.D.   On: 02/11/2016 07:11   Dg Chest Port 1 View  02/11/2016  CLINICAL DATA:  Altered mental status, history of COPD, diabetes, cardiomyopathy, current smoker. EXAM: PORTABLE CHEST 1 VIEW COMPARISON:  Portable chest x-ray of Feb 10, 2016 FINDINGS: The lungs remain well-expanded. The interstitial markings remain mildly increased. The cardiac silhouette remains enlarged and the pulmonary vascularity mildly engorged. The permanent pacemaker defibrillator is in stable position. The left internal jugular venous catheter tip projects over the proximal SVC. IMPRESSION: CHF with mild interstitial edema superimposed upon COPD. No evidence of aspiration. There has not been significant interval change since the previous study. Electronically Signed   By: David  Martinique M.D.   On: 02/11/2016 08:04   Dg Chest Port 1 View  02/10/2016  CLINICAL DATA:  65 year old female with central line placement EXAM: PORTABLE CHEST 1 VIEW COMPARISON:  Earlier Chest radiograph dated 02/10/2016 FINDINGS: There has been interval placement of a left IJ central line with tip over the upper mediastinum likely at the junction of the left innominate and SVC. There is a small left pleural effusion. Left lung base atelectasis. The right lung is clear. No pneumothorax. There is cardiomegaly.  Left pectoral AICD device. No acute osseous pathology. IMPRESSION: Left IJ central line with tip at the junction of the innominate and SVC. No pneumothorax. Electronically Signed   By: Anner Crete M.D.   On: 02/10/2016 23:44   I have personally reviewed and evaluated these images and lab results as part of my medical decision-making.    EKG Interpretation   Date/Time:  Friday Feb 05 2016 12:21:55 EDT Ventricular Rate:  107 PR Interval:  161 QRS Duration: 99 QT Interval:  387 QTC Calculation: 516 R Axis:   29 Text Interpretation:  Sinus tachycardia with pacs LAE, consider biatrial  enlargement Possible anterior infarct, age indeterminate Prolonged QT  interval No significant change since last tracing Confirmed by CRENSHAW   MD, Barlow ([id number]) on 02/05/2016 3:40:50 PM      MDM   Final diagnoses:  Abdominal pain    PT comes in primarily for abd pain, nausea and emesis. She came in as code stroke - which was called off.  Pt has abd tenderness with nausea and emesis. CT scan ordered - there is nothing focal, The gall bladder area does have some fluid surrounding it, and on repeat exam she had tenderness worse on the RUQ, so [place] ordered and results pending.  Labs are reassuring except for the elevated alkphos.  Signing out to Dr. Jeneen Rinks, [place] is pending.  If patient passes po challenge and [place] is neg, she can be discharged.    Varney Biles, MD 02/11/16 [phone]

## 2016-02-11 NOTE — Progress Notes (Signed)
Patient was seen in clinic with [name], PharmD, PGY1 pharmacy resident. I agree with the assessment and plan of care documented.

## 2016-02-11 NOTE — Progress Notes (Signed)
Subjective:  Pt continues to be somnolent and running low grade fever to 100.6. She does open eyes to commands and answers to questions but spontaneously falls asleep.   Repeat ABG this morning on 3L oxygen shows pH 7.31, CO2 of 59, O2 of 69 and bicarb 30 VQ scan ruled out PE    Objective: Vital signs in last 24 hours: Filed Vitals:   02/11/16 0520 02/11/16 0800 02/11/16 0826 02/11/16 1200  BP: 130/71 127/76  152/89  Pulse:  57  111  Temp: 100.2 F (37.9 C) 100.6 F (38.1 C)  100.3 F (37.9 C)  TempSrc: Oral Oral  Oral  Resp: 18 19  18   Height:      Weight:      SpO2: 99% 97% 97% 96%   Weight change:   Intake/Output Summary (Last 24 hours) at 02/11/16 1622 Last data filed at 02/11/16 0340  Gross per 24 hour  Intake    500 ml  Output    300 ml  Net    200 ml   General: Vital signs reviewed. Patient somnolent and on 3L oxygen. No nuchal rigidity Cardiovascular: regular rate, rhythm, no murmur appreciated  Pulmonary/Chest: diffuse crackles on both sides Abdominal: obese , non-tender, non-distended, BS + Extremities:2+ pitting edema  Skin: Warm, dry and intact. No rashes or erythema.   Lab Results: Results for orders placed or performed during the hospital encounter of 02/10/16 (from the past 24 hour(s))  MRSA PCR Screening     Status: None   Collection Time: 02/10/16 10:14 PM  Result Value Ref Range   MRSA by PCR NEGATIVE NEGATIVE  Glucose, capillary     Status: Abnormal   Collection Time: 02/10/16 11:57 PM  Result Value Ref Range   Glucose-Capillary 166 (H) 65 - 99 mg/dL  Basic metabolic panel     Status: Abnormal   Collection Time: 02/11/16 12:05 AM  Result Value Ref Range   Sodium 136 135 - 145 mmol/L   Potassium 3.5 3.5 - 5.1 mmol/L   Chloride 96 (L) 101 - 111 mmol/L   CO2 29 22 - 32 mmol/L   Glucose, Bld 161 (H) 65 - 99 mg/dL   BUN 40 (H) 6 - 20 mg/dL   Creatinine, Ser 2.61 (H) 0.44 - 1.00 mg/dL   Calcium 8.3 (L) 8.9 - 10.3 mg/dL   GFR calc non Af  Amer 19 (L) >60 mL/min   GFR calc Af Amer 21 (L) >60 mL/min   Anion gap 11 5 - 15  CBC     Status: Abnormal   Collection Time: 02/11/16 12:05 AM  Result Value Ref Range   WBC 13.1 (H) 4.0 - 10.5 K/uL   RBC 5.00 3.87 - 5.11 MIL/uL   Hemoglobin 12.8 12.0 - 15.0 g/dL   HCT 39.7 36.0 - 46.0 %   MCV 79.4 78.0 - 100.0 fL   MCH 25.6 (L) 26.0 - 34.0 pg   MCHC 32.2 30.0 - 36.0 g/dL   RDW 15.0 11.5 - 15.5 %   Platelets 165 150 - 400 K/uL  Gamma GT     Status: None   Collection Time: 02/11/16 12:05 AM  Result Value Ref Range   GGT 24 7 - 50 U/L  Troponin I     Status: Abnormal   Collection Time: 02/11/16 12:05 AM  Result Value Ref Range   Troponin I 0.17 (H) <0.031 ng/mL  Differential     Status: Abnormal   Collection Time: 02/11/16 12:05 AM  Result Value Ref Range   Neutro Abs 9.9 (H) 1.7 - 7.7 K/uL   Lymphs Abs 1.4 0.7 - 4.0 K/uL   Monocytes Absolute 0.8 0.1 - 1.0 K/uL   Eosinophils Absolute 1.6 (H) 0.0 - 0.7 K/uL   Basophils Absolute 0.0 0.0 - 0.1 K/uL   Neutrophils Relative % 72 %   Lymphocytes Relative 10 %   Monocytes Relative 6 %   Eosinophils Relative 12 %   Basophils Relative 0 %   WBC Morphology ATYPICAL LYMPHOCYTES    Smear Review LARGE PLATELETS PRESENT   Brain natriuretic peptide     Status: None   Collection Time: 02/11/16 12:05 AM  Result Value Ref Range   B Natriuretic Peptide 25.0 0.0 - 100.0 pg/mL  Sodium, urine, random     Status: None   Collection Time: 02/11/16  3:32 AM  Result Value Ref Range   Sodium, Ur 48 mmol/L  Creatinine, urine, random     Status: None   Collection Time: 02/11/16  3:32 AM  Result Value Ref Range   Creatinine, Urine 270.09 mg/dL  Urinalysis, Routine w reflex microscopic (not at [facility])     Status: Abnormal   Collection Time: 02/11/16  3:32 AM  Result Value Ref Range   Color, Urine AMBER (A) YELLOW   APPearance CLOUDY (A) CLEAR   Specific Gravity, Urine 1.021 1.005 - 1.030   pH 5.5 5.0 - 8.0   Glucose, UA NEGATIVE NEGATIVE mg/dL     Hgb urine dipstick NEGATIVE NEGATIVE   Bilirubin Urine SMALL (A) NEGATIVE   Ketones, ur NEGATIVE NEGATIVE mg/dL   Protein, ur 30 (A) NEGATIVE mg/dL   Nitrite NEGATIVE NEGATIVE   Leukocytes, UA TRACE (A) NEGATIVE  Urine microscopic-add on     Status: Abnormal   Collection Time: 02/11/16  3:32 AM  Result Value Ref Range   Squamous Epithelial / LPF 6-30 (A) NONE SEEN   WBC, UA 0-5 0 - 5 WBC/hpf   RBC / HPF 0-5 0 - 5 RBC/hpf   Bacteria, UA RARE (A) NONE SEEN  Lactic acid, plasma     Status: None   Collection Time: 02/11/16  6:00 AM  Result Value Ref Range   Lactic Acid, Venous 0.8 0.5 - 2.0 mmol/L  Ammonia     Status: None   Collection Time: 02/11/16  6:00 AM  Result Value Ref Range   Ammonia 30 9 - 35 umol/L  I-STAT 3, arterial blood gas (G3+)     Status: Abnormal   Collection Time: 02/11/16  8:39 AM  Result Value Ref Range   pH, Arterial 7.310 (L) 7.350 - 7.450   pCO2 arterial 59.8 (HH) 35.0 - 45.0 mmHg   pO2, Arterial 69.0 (L) 80.0 - 100.0 mmHg   Bicarbonate 30.1 (H) 20.0 - 24.0 mEq/L   TCO2 32 0 - 100 mmol/L   O2 Saturation 91.0 %   Acid-Base Excess 2.0 0.0 - 2.0 mmol/L   Patient temperature HIDE    Collection site RADIAL, ALLEN'S TEST ACCEPTABLE    Drawn by Operator    Sample type ARTERIAL    Comment NOTIFIED PHYSICIAN   Glucose, capillary     Status: None   Collection Time: 02/11/16  9:05 AM  Result Value Ref Range   Glucose-Capillary 96 65 - 99 mg/dL  Vitamin B12     Status: None   Collection Time: 02/11/16 12:59 PM  Result Value Ref Range   Vitamin B-12 348 180 - 914 pg/mL  Glucose, capillary  Status: None   Collection Time: 02/11/16  1:19 PM  Result Value Ref Range   Glucose-Capillary 72 65 - 99 mg/dL  Troponin I (q 6hr x 3)     Status: Abnormal   Collection Time: 02/11/16  2:30 PM  Result Value Ref Range   Troponin I 0.04 (H) <0.031 ng/mL  Glucose, capillary     Status: Abnormal   Collection Time: 02/11/16  3:20 PM  Result Value Ref Range    Glucose-Capillary 161 (H) 65 - 99 mg/dL      Micro Results: Recent Results (from the past 240 hour(s))  MRSA PCR Screening     Status: None   Collection Time: 02/10/16 10:14 PM  Result Value Ref Range Status   MRSA by PCR NEGATIVE NEGATIVE Final    Comment:        The GeneXpert MRSA Assay (FDA approved for NASAL specimens only), is one component of a comprehensive MRSA colonization surveillance program. It is not intended to diagnose MRSA infection nor to guide or monitor treatment for MRSA infections.    Studies/Results: X-ray Chest Pa And Lateral  02/10/2016  CLINICAL DATA:  Hypoxia and weakness EXAM: CHEST  2 VIEW COMPARISON:  01/03/2016 FINDINGS: LEFT-sided pacemaker with a single lead overlies enlarged cardiac silhouette. There is increased central venous congestion compared to prior. Small pleural effusions noted. IMPRESSION: Cardiomegaly with increased central venous congestion and pleural effusions consistent with congestive heart failure. Electronically Signed   By: Suzy Bouchard M.D.   On: 02/10/2016 18:12   US Renal  02/11/2016  CLINICAL DATA:  Acute kidney injury EXAM: RENAL / URINARY TRACT ULTRASOUND COMPLETE COMPARISON:  None. FINDINGS: Right Kidney: Length: 12.6 cm. Echogenicity within normal limits. No mass or hydronephrosis visualized. Left Kidney: Length: 11.4 cm. Echogenicity within normal limits. No mass or hydronephrosis visualized. Bladder: Appears normal for degree of bladder distention. IMPRESSION: Both kidneys appear normal.  No hydronephrosis. Electronically Signed   By: Andreas Newport M.D.   On: 02/11/2016 01:30   Nm Pulmonary Perf And Vent  02/11/2016  CLINICAL DATA:  Hypoxia EXAM: NUCLEAR MEDICINE VENTILATION - PERFUSION LUNG SCAN TECHNIQUE: Ventilation images were obtained in multiple projections using inhaled aerosol Tc-50m DTPA. Perfusion images were obtained in multiple projections after intravenous injection of Tc-23m MAA. RADIOPHARMACEUTICALS:   31.7 mCi Technetium-31m DTPA aerosol inhalation and 4.19 mCi Technetium-44m MAA IV COMPARISON:  Chest x-ray from the previous day FINDINGS: Ventilation: There is adequate uptake on the ventilation images. Large cardiac shadow is noted. Some mild decreased ventilation is noted in the right middle lobe. Perfusion: Perfusion images demonstrate adequate uptake throughout both lungs with the exception of a area of decreased perfusion in the right middle lobe. This matches the ventilation images and likely related to focal atelectasis. IMPRESSION: No ventilation-perfusion mismatch to suggest pulmonary embolism is noted. There is a matched defect in the right middle lobe likely related to atelectasis. Electronically Signed   By: Inez Catalina M.D.   On: 02/11/2016 07:11   Dg Chest Port 1 View  02/11/2016  CLINICAL DATA:  Altered mental status, history of COPD, diabetes, cardiomyopathy, current smoker. EXAM: PORTABLE CHEST 1 VIEW COMPARISON:  Portable chest x-ray of Feb 10, 2016 FINDINGS: The lungs remain well-expanded. The interstitial markings remain mildly increased. The cardiac silhouette remains enlarged and the pulmonary vascularity mildly engorged. The permanent pacemaker defibrillator is in stable position. The left internal jugular venous catheter tip projects over the proximal SVC. IMPRESSION: CHF with mild interstitial edema superimposed upon COPD.  No evidence of aspiration. There has not been significant interval change since the previous study. Electronically Signed   By: David  Martinique M.D.   On: 02/11/2016 08:04   Dg Chest Port 1 View  02/10/2016  CLINICAL DATA:  65 year old female with central line placement EXAM: PORTABLE CHEST 1 VIEW COMPARISON:  Earlier Chest radiograph dated 02/10/2016 FINDINGS: There has been interval placement of a left IJ central line with tip over the upper mediastinum likely at the junction of the left innominate and SVC. There is a small left pleural effusion. Left lung base  atelectasis. The right lung is clear. No pneumothorax. There is cardiomegaly. Left pectoral AICD device. No acute osseous pathology. IMPRESSION: Left IJ central line with tip at the junction of the innominate and SVC. No pneumothorax. Electronically Signed   By: Anner Crete M.D.   On: 02/10/2016 23:44   Medications: I have reviewed the patient's current medications. Scheduled Meds: . aspirin EC  81 mg Oral Daily  . atorvastatin  80 mg Oral QHS  . insulin aspart  0-15 Units Subcutaneous TID WC  . insulin aspart  0-5 Units Subcutaneous QHS  . insulin glargine  10 Units Subcutaneous Daily  . sodium chloride flush  3 mL Intravenous Q12H  . tiotropium  18 mcg Inhalation Daily   Continuous Infusions:  PRN Meds:.acetaminophen **OR** acetaminophen, albuterol, technetium TC 54M diethylenetriame-pentaacetic acid Assessment/Plan: Active Problems:   Acute encephalopathy   AKI (acute kidney injury) ([place])   Altered mental status   Encounter for central line placement   Hypoxia   Uncontrolled type 1 diabetes with diabetic neuropathy (HCC)   CHF (congestive heart failure), NYHA class II ([place])  Acute encephalopathy: Patient presented with severe hypoxia with only mild hypercarbia. On her repeat ABG, the CO2 increased when oxygen was administered. We did a VQ scan on her which showed no evidence for acute pulmonary embolus or left to right shunt. However, patient is running low grade fevers to 100.6 However, she denies any nuchal rigidity. We will obtain an LP. Since she received her morning lovenox dose, LP will be done tomorrow before initiation of any abx. If she spikes any f ever, then she can start vanc and rocephin. A brain MRi is unable to be ontained as I called radiology and she has a st jude AICD in place.   -obtained blood cultures --perform lumbar puncture- consulted PCCM  -maintain O2 sats between 88-92%   Acute HFrEF: Echo with bubble study showed EF of 20-25% which was severly  reduced compared to the prior one from 2015 which showed EF of 40%. She has signs of heart failure with crackles on exam and 2+ pitting edema. However, her BNP is 25 which is very surprising. She has an AICD in place.   -consulted cardiology/heart failure team for help with fluid management    Acute renal failure: Cr on admission was markedly elevated, which went down after receiving fluid. FeNa was 0.3. She also received IV contrast dye 5 days ago. A renal ultrasound was unremarkable. Baseline cr of 0.9   -repeat BMET tomorrow   Elevated troponin levels: We trended troponins and they peaked at 0.17before downtrending to 0.04 -consulted cardiology to rule out any myocardial injury  COPD: PFTs in Sept 2016 showing reduced FEV1 and increased FEV1 to FVC ratio.  -spiriva -albuterol  T2DM: CBGs well controlled -lantus 10 units -SSI-M  HTN: pressures are well controlled     Diet: carb modified     Dispo: Disposition  is deferred at this time, awaiting improvement of current medical problems.  Anticipated discharge in approximately 3 day(s).   The patient does have a current PCP (Alexa Angela Burke, MD) and does need an [facility] hospital follow-up appointment after discharge.  The patient does not have transportation limitations that hinder transportation to clinic appointments.  .Services Needed at time of discharge: Y = Yes, Blank = No PT:   OT:   RN:   Equipment:   Other:     LOS: 1 day   Peggy Estelle, MD 02/11/2016, 4:22 PM

## 2016-02-11 NOTE — Progress Notes (Signed)
  Echocardiogram 2D Echocardiogram with Bubble study has been performed.  Jennette Dubin 02/11/2016, 11:25 AM

## 2016-02-11 NOTE — Progress Notes (Signed)
MD Tiburcio Pea contacted in regards to echo results, patient SBP rising since admission yesterday with no meds at this time to control, and patient family request for diet order. New order received for carb mod/cardiac diet. MD advised they are reviewing her chart/findings and will advise as necessary.

## 2016-02-11 NOTE — Consult Note (Signed)
CARDIOLOGY CONSULT NOTE   Patient ID: [name] MRN: [id number] DOB/AGE: 1951/02/22 65 y.o.  Admit date: 02/10/2016  Requesting Physician: Dr. Beryle Beams Primary Physician:   Florinda Marker, MD Primary Cardiologist:  Dr. Lovena Le Reason for Consultation:   CHF  HPI: [name] is a 66 y.o. female with a history of chronic combined S/D CHF/NICM s/p ICD, tobacco abuse, obesity, HTN, uncontrolled T2DM, probable OSA/OHS, COPD on 2L 02 at night who was admitted from the clinic on 02/10/16 due to lethargy. She was found to have AKI, hypoxia, AMS and leukocytosis. Cardiology is consulted for help with CHF management.  She had heart cath in 2003 which showed normal coronaries and moderate left ventricular dysfunction. This was felt to be due to hypertensive heart disease and uncontrolled blood pressures. Her renal arteries were normal.  2-D ECHO 03/2014 with EF 35-40%, G1DD, mild AR/MR, mild LAE. PA pressure 38. She had removal of a previously implanted ICD which reached ERI and insertion of new St. Jude ICD in 04/2014.  She was last seen by her primary cardiologist Dr. Lovena Le and 08/2015. She was felt to be stable from a volume standpoint. Her St. Jude ICD was working normally.  Over the past 5 days the patient has been increasingly somnolent at home. She also had leg pain and n/v and confusion as well as tremors and temporary hearing loss. They sought care in the ER 2 times (once here and once at [facility]) without any significant findings and were sent home.   She was seen by Martyn Malay MD in the clinic on 5/10 and the patient remained somnolent, confused and in pain. She was noted to be hypertensive, tachycardic and hypoxic and it was decided to admit her to [facility]. Initially suspected that AMS was r/t hypercarbia in setting untreated OSA but PCO2 was not significantly elevated.  In the ER her BNP was normal but chest x-ray with some pulmonary vascular congestion. She was  found to have creatinine 2.61 (up from 0.7), troponin 0.17, BNP 25.0, UA negative, WBC 13.1. Low-grade fever. PH 7.3 pCO2 59.8.  5/11 Echo with bubble study: EF 20-25%, severe global reduction in LV function, grade 1 diastolic dysfunction, neg bubble study  VQ scan 5/11: No ventilation-perfusion mismatch to suggest pulmonary embolism is noted. There is a matched defect in the right middle lobe likely related to atelectasis. Renal u/s 5/11: Both kidneys appear normal. No hydronephrosis.  PCCM was asked to see who recommend CT of the head, EEG and possible neuro eval.  She is alert and responsive during my interview. She does fall asleep once. No CP or SOB. No LE edema, orthopnea or PND. No dizziness or syncope. No complaints currently. Just feels weak and sleepy.    Past Medical History  Diagnosis Date  . Hyperthyroidism, subclinical   . Post menopausal syndrome   . Obesity   . Cardiac defibrillator in situ     Atlas II VR (SJM) implanted by Dr Lovena Le  . Eczema   . Lipoma   . Chronic ulcer of leg ([place])     04-09-15 resolved-not a problem.  . Hyperlipidemia   . Chronic combined systolic and diastolic heart failure (HCC)     a. EF 35-40% in past;  b. Echo 7/13:  EF 45-50%, Gr 2 diast dysfn, mild AI, mild MAC, trivial MR, mild LAE, PASP 47.  [name] NICM (nonischemic cardiomyopathy) ([place])   . Diabetes mellitus   . HTN (hypertension)   .  Elevated alkaline phosphatase level     GGT and 5'nucleotidase 8/13 normal  . Hx of cardiac cath     a. [place] 2003 normal;  b. LHC 6/13:  Mild calcification in the LM, o/w normal coronary arteries, EF 45%.   . Sleep apnea     pt denies 04/12/2013  . COPD (chronic obstructive pulmonary disease) (HCC)     O2 at night  . Automatic implantable cardioverter-defibrillator in situ   . CHF (congestive heart failure) ([place])   . Depression   . Implantable cardioverter-defibrillator (ICD) generator end of life   . Colon polyps 04/12/2013    Rectosigmoid polyp  .  History of oxygen administration     oxygen @ 2 l/m nasally bedtime 24/7  . ATN (acute tubular necrosis) ([place]) 07/15/2014     Past Surgical History  Procedure Laterality Date  . Cardiac defibrillator placement  05/04/2007    SJM Atlas II VR ICD  . Hysteroscopy    . Cardiac defibrillator placement    . Abdominal hysterectomy    . Cardiac catheterization    . Insert / replace / remove pacemaker    . Tubal ligation    . Hernia repair    . Colonoscopy N/A 04/12/2013    Procedure: COLONOSCOPY;  Surgeon: Beryle Beams, MD;  Location: [facility] ENDOSCOPY;  Service: Endoscopy;  Laterality: N/A;  pt.has defibrilator  . Left heart catheterization with coronary angiogram N/A 04/02/2012    Procedure: LEFT HEART CATHETERIZATION WITH CORONARY ANGIOGRAM;  Surgeon: Hillary Bow, MD;  Location: [facility] CATH LAB;  Service: Cardiovascular;  Laterality: N/A;  . Implantable cardioverter defibrillator (icd) generator change N/A 04/02/2014    Procedure: ICD GENERATOR CHANGE;  Surgeon: Evans Lance, MD;  Location: [facility] CATH LAB;  Service: Cardiovascular;  Laterality: N/A;  . Esophagogastroduodenoscopy N/A 04/16/2015    Procedure: ESOPHAGOGASTRODUODENOSCOPY (EGD);  Surgeon: [name], MD;  Location: [facility];  Service: Endoscopy;  Laterality: N/A;  . Colonoscopy N/A 04/16/2015    Procedure: COLONOSCOPY;  Surgeon: [name], MD;  Location: [facility] ENDOSCOPY;  Service: Endoscopy;  Laterality: N/A;    Allergies  Allergen Reactions  . Actos [Pioglitazone] Other (See Comments)    REACTION: congestive heart failure   . Naproxen Other (See Comments)    500mg  dose made her sleep for two days  . Avandia [Rosiglitazone Maleate] Other (See Comments)    unknown  . Hydrocortisone Other (See Comments)    unknown    I have reviewed the patient's current medications . aspirin EC  81 mg Oral Daily  . atorvastatin  80 mg Oral QHS  . insulin aspart  0-15 Units Subcutaneous TID WC  . insulin aspart  0-5 Units  Subcutaneous QHS  . insulin glargine  10 Units Subcutaneous Daily  . sodium chloride flush  3 mL Intravenous Q12H  . tiotropium  18 mcg Inhalation Daily     acetaminophen **OR** acetaminophen, albuterol, technetium TC 28M diethylenetriame-pentaacetic acid  Prior to Admission medications   Medication Sig Start Date End Date Taking? Authorizing Provider  albuterol (PROVENTIL HFA;VENTOLIN HFA) 108 (90 Base) MCG/ACT inhaler Inhale 2 puffs into the lungs every 6 (six) hours as needed for wheezing or shortness of breath. 11/13/15  Yes Alexa Angela Burke, MD  albuterol (PROVENTIL) (2.5 MG/3ML) 0.083% nebulizer solution USE 1 VIAL VIA NEBULIZER EVERY 6 HOURS AS NEEDED FOR WHEEZING OR SHORTNESS OF BREATH 11/13/15  Yes Alexa Angela Burke, MD  amLODipine-olmesartan (AZOR) 10-40 MG tablet Take 1 tablet by  mouth daily. 12/07/15  Yes Alexa Angela Burke, MD  aspirin EC 81 MG tablet Take 1 tablet (81 mg total) by mouth daily. 11/13/15  Yes Alexa Angela Burke, MD  atorvastatin (LIPITOR) 80 MG tablet Take 1 tablet (80 mg total) by mouth at bedtime. 11/13/15  Yes Alexa Angela Burke, MD  furosemide (LASIX) 40 MG tablet Take 1 tablet (40 mg total) by mouth daily. Place Rx on file (filled Rx on 11/11/15) 11/13/15  Yes Alexa Angela Burke, MD  gabapentin (NEURONTIN) 300 MG capsule Take 2 capsules (600 mg total) by mouth 3 (three) times daily. Place Rx on file (picked up Rx on 11/11/15) Patient taking differently: Take 300 mg by mouth at bedtime. Place Rx on file (picked up Rx on 11/11/15) 11/13/15  Yes Alexa Angela Burke, MD  insulin glargine (LANTUS) 100 UNIT/ML injection Inject 0.6 mLs (60 Units total) into the skin daily. 11/21/15  Yes Alexa Angela Burke, MD  insulin lispro (HUMALOG KWIKPEN) 100 UNIT/ML KiwkPen Inject 0.1 mLs (10 Units total) into the skin 3 (three) times daily. 12/09/15  Yes Alexa Angela Burke, MD  metFORMIN (GLUCOPHAGE-XR) 500 MG 24 hr tablet Take 4 tablets (2,000 mg total) by mouth daily with breakfast. 12/09/15  Yes Alexa Angela Burke, MD  metoprolol succinate  (TOPROL-XL) 50 MG 24 hr tablet Take 1 tablet (50 mg total) by mouth daily. 11/13/15  Yes Alexa Angela Burke, MD  naproxen (NAPROSYN) 500 MG tablet Take 1 tablet (500 mg total) by mouth 2 (two) times daily as needed for moderate pain. 01/28/16  Yes Alexa Angela Burke, MD  nitroGLYCERIN (NITROSTAT) 0.4 MG SL tablet Place 0.4 mg under the tongue every 5 (five) minutes as needed for chest pain.   Yes Historical Provider, MD  omeprazole (PRILOSEC) 20 MG capsule Take 1 capsule (20 mg total) by mouth 2 (two) times daily. 02/05/16  Yes Tanna Furry, MD  ondansetron (ZOFRAN ODT) 4 MG disintegrating tablet Take 1 tablet (4 mg total) by mouth every 8 (eight) hours as needed for nausea. 02/05/16  Yes Tanna Furry, MD  tiotropium (SPIRIVA) 18 MCG inhalation capsule Place 1 capsule (18 mcg total) into inhaler and inhale daily. 11/13/15  Yes Alexa Angela Burke, MD  glucose blood (ONE TOUCH ULTRA TEST) test strip Use as instructed by doctor up to 3x daily,dx code 250.02 insulin requiring 11/13/15   Alexa Angela Burke, MD  Insulin Syringe-Needle U-100 31G X 5/16" 1 ML MISC Use to give Lantus Insulin daily. E11.22. 12/12/15   Florinda Marker, MD     Social History   Social History  . Marital Status: Widowed    Spouse Name: Alroy Dust  . Number of Children: 3  . Years of Education: 11   Occupational History  . disabled    Social History Main Topics  . Smoking status: Current Some Day Smoker -- 0.20 packs/day for 35 years    Types: Cigarettes  . Smokeless tobacco: Never Used     Comment: Trying to quit.  . Alcohol Use: No  . Drug Use: No  . Sexual Activity: Not on file   Other Topics Concern  . Not on file   Social History Narrative   ** Merged History Encounter **       Married    Family Status  Relation Status Death Age  . Mother Deceased   . Father Deceased    Family History  Problem Relation Age of Onset  . Stroke Mother   . Seizures Father   . Diabetes  Sister   . Asthma Maternal Aunt     aunts  . Asthma Maternal Uncle      uncles  . Heart disease Father   . Heart disease Paternal Aunt     aunts  . Heart disease Paternal Uncle     uncles  . Heart disease Maternal Aunt     aunts  . Heart disease Maternal Uncle     uncles  . Heart disease Maternal Grandfather   . Colon cancer Neg Hx   . Colon polyps Neg Hx   . Esophageal cancer Neg Hx   . Kidney disease Neg Hx   . Gallbladder disease Neg Hx      ROS:  Full 14 point review of systems complete and found to be negative unless listed above.  Physical Exam: Blood pressure 152/89, pulse 111, temperature 100.3 F (37.9 C), temperature source Oral, resp. rate 18, height 5' (1.524 m), weight 235 lb 7.2 oz (106.8 kg), SpO2 96 %.  General: Well developed, well nourished, female in no acute distress. obese . Head: Eyes PERRLA, No xanthomas.   Normocephalic and atraumatic, oropharynx without edema or exudate.  Lungs: crackles at bases.  Heart: HRRR S1 S2, no rub/gallop, Heart regular rhythm, tachy with S1, S2  murmur. pulses are 2+ extrem.  1+ LE edema  Neck: No carotid bruits. No lymphadenopathy. + mild JVD. Abdomen: Bowel sounds present, abdomen soft and non-tender without masses or hernias noted. Msk:  No spine or cva tenderness. No weakness, no joint deformities or effusions. Extremities: No clubbing or cyanosis. No  edema.  Neuro: Alert and oriented X 3. No focal deficits noted. Psych:  Good affect, responds appropriately Skin: No rashes or lesions noted.  Labs:   Lab Results  Component Value Date   WBC 13.1* 02/11/2016   HGB 12.8 02/11/2016   HCT 39.7 02/11/2016   MCV 79.4 02/11/2016   PLT 165 02/11/2016   No results for input(s): INR in the last 72 hours.  Recent Labs Lab 02/10/16 1609 02/11/16 0005  NA 131* 136  K 4.1 3.5  CL 92* 96*  CO2 26 29  BUN 42* 40*  CREATININE 3.94* 2.61*  CALCIUM 8.2* 8.3*  PROT 7.0  --   BILITOT 1.2  --   ALKPHOS 183*  --   ALT 18  --   AST 15  --   GLUCOSE 369* 161*  ALBUMIN 3.0*  --     MAGNESIUM  Date Value Ref Range Status  02/10/2016 1.8 1.7 - 2.4 mg/dL Final    Recent Labs  02/10/16 1609 02/11/16 0005 02/11/16 1430  TROPONINI 0.05* 0.17* 0.04*   No results for input(s): TROPIPOC in the last 72 hours. PRO B NATRIURETIC PEPTIDE (BNP)  Date/Time Value Ref Range Status  08/06/2014 03:08 AM 216.7* 0 - 125 pg/mL Final  07/12/2014 05:26 AM 718.4* 0 - 125 pg/mL Final   Lab Results  Component Value Date   CHOL 144 07/21/2014   HDL 54 07/21/2014   LDLCALC 60 07/21/2014   TRIG 149 07/21/2014   Lab Results  Component Value Date   DDIMER <0.22 04/02/2012   LIPASE  Date/Time Value Ref Range Status  02/26/2015 09:13 AM 17.0 11.0 - 59.0 U/L Final   TSH  Date/Time Value Ref Range Status  02/10/2016 04:09 PM 0.603 0.350 - 4.500 uIU/mL Final  08/06/2014 09:39 AM 0.415 0.350 - 4.500 uIU/mL Final   VITAMIN B-12  Date/Time Value Ref Range Status  02/11/2016 12:59 PM 348 180 -  914 pg/mL Final    Comment:    (NOTE) This assay is not validated for testing neonatal or myeloproliferative syndrome specimens for Vitamin B12 levels.     Echo: 02/11/2016 LV EF: 20% -25% Study Conclusions - Left ventricle: The cavity size was mildly dilated. Wall  thickness was normal. Systolic function was severely reduced. The  estimated ejection fraction was in the range of 20% to 25%.  Diffuse hypokinesis. Doppler parameters are consistent with  abnormal left ventricular relaxation (grade 1 diastolic  dysfunction). - Mitral valve: Calcified annulus. - Pulmonary arteries: Systolic pressure was mildly increased. Impressions: - Severe global reduction in LV function; grade 1 diastolic  dysfunction; trace MR and TR; mldly elevated pulmonary pressure;  negative saline microcavitation study.  ECG:  Sinus tachycardia with PAC. HR 107. Diffuse TWIs  Radiology:  X-ray Chest Pa And Lateral  02/10/2016  CLINICAL DATA:  Hypoxia and weakness EXAM: CHEST  2 VIEW COMPARISON:   01/03/2016 FINDINGS: LEFT-sided pacemaker with a single lead overlies enlarged cardiac silhouette. There is increased central venous congestion compared to prior. Small pleural effusions noted. IMPRESSION: Cardiomegaly with increased central venous congestion and pleural effusions consistent with congestive heart failure. Electronically Signed   By: Suzy Bouchard M.D.   On: 02/10/2016 18:12   US Renal  02/11/2016  CLINICAL DATA:  Acute kidney injury EXAM: RENAL / URINARY TRACT ULTRASOUND COMPLETE COMPARISON:  None. FINDINGS: Right Kidney: Length: 12.6 cm. Echogenicity within normal limits. No mass or hydronephrosis visualized. Left Kidney: Length: 11.4 cm. Echogenicity within normal limits. No mass or hydronephrosis visualized. Bladder: Appears normal for degree of bladder distention. IMPRESSION: Both kidneys appear normal.  No hydronephrosis. Electronically Signed   By: Andreas Newport M.D.   On: 02/11/2016 01:30   Nm Pulmonary Perf And Vent  02/11/2016  CLINICAL DATA:  Hypoxia EXAM: NUCLEAR MEDICINE VENTILATION - PERFUSION LUNG SCAN TECHNIQUE: Ventilation images were obtained in multiple projections using inhaled aerosol Tc-48m DTPA. Perfusion images were obtained in multiple projections after intravenous injection of Tc-34m MAA. RADIOPHARMACEUTICALS:  31.7 mCi Technetium-37m DTPA aerosol inhalation and 4.19 mCi Technetium-9m MAA IV COMPARISON:  Chest x-ray from the previous day FINDINGS: Ventilation: There is adequate uptake on the ventilation images. Large cardiac shadow is noted. Some mild decreased ventilation is noted in the right middle lobe. Perfusion: Perfusion images demonstrate adequate uptake throughout both lungs with the exception of a area of decreased perfusion in the right middle lobe. This matches the ventilation images and likely related to focal atelectasis. IMPRESSION: No ventilation-perfusion mismatch to suggest pulmonary embolism is noted. There is a matched defect in the right  middle lobe likely related to atelectasis. Electronically Signed   By: Inez Catalina M.D.   On: 02/11/2016 07:11   Dg Chest Port 1 View  02/11/2016  CLINICAL DATA:  Altered mental status, history of COPD, diabetes, cardiomyopathy, current smoker. EXAM: PORTABLE CHEST 1 VIEW COMPARISON:  Portable chest x-ray of Feb 10, 2016 FINDINGS: The lungs remain well-expanded. The interstitial markings remain mildly increased. The cardiac silhouette remains enlarged and the pulmonary vascularity mildly engorged. The permanent pacemaker defibrillator is in stable position. The left internal jugular venous catheter tip projects over the proximal SVC. IMPRESSION: CHF with mild interstitial edema superimposed upon COPD. No evidence of aspiration. There has not been significant interval change since the previous study. Electronically Signed   By: David  Martinique M.D.   On: 02/11/2016 08:04   Dg Chest Port 1 View  02/10/2016  CLINICAL DATA:  65 year old  female with central line placement EXAM: PORTABLE CHEST 1 VIEW COMPARISON:  Earlier Chest radiograph dated 02/10/2016 FINDINGS: There has been interval placement of a left IJ central line with tip over the upper mediastinum likely at the junction of the left innominate and SVC. There is a small left pleural effusion. Left lung base atelectasis. The right lung is clear. No pneumothorax. There is cardiomegaly. Left pectoral AICD device. No acute osseous pathology. IMPRESSION: Left IJ central line with tip at the junction of the innominate and SVC. No pneumothorax. Electronically Signed   By: Anner Crete M.D.   On: 02/10/2016 23:44    ASSESSMENT AND PLAN:    Active Problems:   Acute encephalopathy   AKI (acute kidney injury) ([place])   Altered mental status   Encounter for central line placement   Hypoxia   Uncontrolled type 1 diabetes with diabetic neuropathy (HCC)   CHF (congestive heart failure), NYHA class II ([place])  SENYA MURIN is a 65 y.o. female with a history of  chronic combined S/D CHF/NICM s/p ICD, tobacco abuse, obesity, HTN, uncontrolled T2DM, probable OSA/OHS, COPD on 2L 02 at night who was admitted from the clinic on 02/10/16 due to lethargy. She was found to have AKI, hypoxia, AMS and leukocytosis. Cardiologyy is consulted for help with CHF management.  Acute on chronic combined S/D CHF: BNP normal but can be falsely low in the setting of obesity. Chest x-ray for the pulmonary vascular congestion. She takes Lasix 40 mg daily at home. She is status post a St. Jude ICD. Echo this admission with worsening EF of 20-25%, severe global reduction in LV function, G1DD.   Elevated troponin:  Trivial elevation  likely demand ischemia in the setting of acute on chronic CHF She has normal coronary arteries on cath in 2003.   AMS: Unclear etiology. PCCM recommends head CT and possible neuro eval.  HTN: Blood pressure elevated. I will resume her home Toprol-XL 50 mg daily.  Acute renal failure: Cr on admission was markedly elevated, which went down after receiving fluid. FeNa was 0.3. She also received IV contrast dye 5 days ago. A renal ultrasound was unremarkable. Baseline cr of 0.9.  Check in AM    COPD: PFTs in Sept 2016 showing reduced FEV1 and increased FEV1 to FVC ratio. Continue home meds  HLD: continue statin.   Leukocytosis and fever: per IM. Blood cultures and lumbar puncture pending  Signed: Angelena Form, PA-C 02/11/2016 4:46 PM  Pager [phone]    Patient seen/examined  I have amended note above by Kathlene November to reflect my findings. Pt 65 yo with NICM Admitted due to feeling poorly  Family admits that pt has had little to eat since befor ER visit on 5/5 when she presented with N/V.  Not clear what precipitated this  On arrival Cr 3.6   Pt given IV fluids and it has improved some  ON exam, pt with evid of edema  JVP is mildly increased  Mild rales at bases  Card exam RRR  No S3  Ext wth 1+ edema  Echo as noted above shows drop in LVEF  from previous study of June 2015  Now 20 to 25%  I would recomm conservative care this evening  REpeat BMET in AM to reassess renal function before trying diuresis Would get Coox  Agree with Toprol for HR/BP control  WIll continue to follow.  Dorris Carnes

## 2016-02-11 NOTE — Consult Note (Signed)
Name: [name] MRN: [id number] DOB: 27-Jul-1951    ADMISSION DATE:  02/10/2016 CONSULTATION DATE:  5/11  REFERRING MD :  IMTS  CHIEF COMPLAINT:  AMS   BRIEF PATIENT DESCRIPTION: 65yo female smoker with hx combined heart failure (EF this admit 20-25%), HTN, uncontrolled DM, COPD on home O2, probable OSA, chronic pain who was admitted 5/10 from clinic due to lethargy.  Found to have AKI, hypoxia, AMS, leukocytosis.  Initially suspected that AMS was r/t hypercarbia in setting untreated OSA but PCO2 not significantly elevated.  VQ scan neg.    SIGNIFICANT EVENTS    STUDIES:  5/11 Echo with bubble study>>> EF 20-25%, severe global reduction in LV function, grade 1 diastolic dysfunction, neg bubble study  VQ scan 5/11>>> No ventilation-perfusion mismatch to suggest pulmonary embolism is noted. There is a matched defect in the right middle lobe likely related to atelectasis. Renal u/s 5/11>>> Both kidneys appear normal. No hydronephrosis.   HISTORY OF PRESENT ILLNESS:  65yo female with hx combined heart failure (EF this admit 20-25%), HTN, uncontrolled DM, COPD on home O2, probable OSA, chronic pain who was admitted 5/10 from clinic due to lethargy.  Found to have AKI, hypoxia, AMS, leukocytosis.  Initially suspected that AMS was r/t hypercarbia in setting untreated OSA but PCO2 not significantly elevated.  VQ scan neg.  Per daughter has been increasingly sleepy at home over last few days but eating normally.  C/o back and leg pain which she sought care for in ER x 2 - once here, once at [facility] - without any significant findings.    Other notable symptoms over last 3-4 days have been tremors (usually when holding a cup or trying to eat) and a spontaneous hearing loss in the left ear which resolved onits own after 24 hours.   Pt has hx of ETOH but has not had a drink in 3-4 months, no known hx liver disease.   Denies fevers, chills, headache, syncope, chest pain, hemoptysis, night  sweats, purulent sputum, dysuria.   PAST MEDICAL HISTORY :   has a past medical history of Hyperthyroidism, subclinical; Post menopausal syndrome; Obesity; Cardiac defibrillator in situ; Eczema; Lipoma; Chronic ulcer of leg ([place]); Hyperlipidemia; Chronic combined systolic and diastolic heart failure ([place]); NICM (nonischemic cardiomyopathy) ([place]); Diabetes mellitus; HTN (hypertension); Elevated alkaline phosphatase level; cardiac cath; Sleep apnea; COPD (chronic obstructive pulmonary disease) ([place]); Automatic implantable cardioverter-defibrillator in situ; CHF (congestive heart failure) ([place]); Depression; Implantable cardioverter-defibrillator (ICD) generator end of life; Colon polyps (04/12/2013); History of oxygen administration; and ATN (acute tubular necrosis) ([place]) (07/15/2014).  has past surgical history that includes Cardiac defibrillator placement (05/04/2007); Hysteroscopy; Cardiac defibrillator placement; Abdominal hysterectomy; Cardiac catheterization; Insert / replace / remove pacemaker; Tubal ligation; Hernia repair; Colonoscopy (N/A, 04/12/2013); left heart catheterization with coronary angiogram (N/A, 04/02/2012); implantable cardioverter defibrillator (icd) generator change (N/A, 04/02/2014); Esophagogastroduodenoscopy (N/A, 04/16/2015); and Colonoscopy (N/A, 04/16/2015). Prior to Admission medications   Medication Sig Start Date End Date Taking? Authorizing Provider  albuterol (PROVENTIL HFA;VENTOLIN HFA) 108 (90 Base) MCG/ACT inhaler Inhale 2 puffs into the lungs every 6 (six) hours as needed for wheezing or shortness of breath. 11/13/15  Yes Alexa Angela Burke, MD  albuterol (PROVENTIL) (2.5 MG/3ML) 0.083% nebulizer solution USE 1 VIAL VIA NEBULIZER EVERY 6 HOURS AS NEEDED FOR WHEEZING OR SHORTNESS OF BREATH 11/13/15  Yes Alexa Angela Burke, MD  amLODipine-olmesartan (AZOR) 10-40 MG tablet Take 1 tablet by mouth daily. 12/07/15  Yes Alexa Angela Burke, MD  aspirin EC 81  MG tablet Take 1 tablet (81 mg total) by  mouth daily. 11/13/15  Yes Alexa Angela Burke, MD  atorvastatin (LIPITOR) 80 MG tablet Take 1 tablet (80 mg total) by mouth at bedtime. 11/13/15  Yes Alexa Angela Burke, MD  furosemide (LASIX) 40 MG tablet Take 1 tablet (40 mg total) by mouth daily. Place Rx on file (filled Rx on 11/11/15) 11/13/15  Yes Alexa Angela Burke, MD  gabapentin (NEURONTIN) 300 MG capsule Take 2 capsules (600 mg total) by mouth 3 (three) times daily. Place Rx on file (picked up Rx on 11/11/15) Patient taking differently: Take 300 mg by mouth at bedtime. Place Rx on file (picked up Rx on 11/11/15) 11/13/15  Yes Alexa Angela Burke, MD  insulin glargine (LANTUS) 100 UNIT/ML injection Inject 0.6 mLs (60 Units total) into the skin daily. 11/21/15  Yes Alexa Angela Burke, MD  insulin lispro (HUMALOG KWIKPEN) 100 UNIT/ML KiwkPen Inject 0.1 mLs (10 Units total) into the skin 3 (three) times daily. 12/09/15  Yes Alexa Angela Burke, MD  metFORMIN (GLUCOPHAGE-XR) 500 MG 24 hr tablet Take 4 tablets (2,000 mg total) by mouth daily with breakfast. 12/09/15  Yes Alexa Angela Burke, MD  metoprolol succinate (TOPROL-XL) 50 MG 24 hr tablet Take 1 tablet (50 mg total) by mouth daily. 11/13/15  Yes Alexa Angela Burke, MD  naproxen (NAPROSYN) 500 MG tablet Take 1 tablet (500 mg total) by mouth 2 (two) times daily as needed for moderate pain. 01/28/16  Yes Alexa Angela Burke, MD  nitroGLYCERIN (NITROSTAT) 0.4 MG SL tablet Place 0.4 mg under the tongue every 5 (five) minutes as needed for chest pain.   Yes Historical Provider, MD  omeprazole (PRILOSEC) 20 MG capsule Take 1 capsule (20 mg total) by mouth 2 (two) times daily. 02/05/16  Yes Tanna Furry, MD  ondansetron (ZOFRAN ODT) 4 MG disintegrating tablet Take 1 tablet (4 mg total) by mouth every 8 (eight) hours as needed for nausea. 02/05/16  Yes Tanna Furry, MD  tiotropium (SPIRIVA) 18 MCG inhalation capsule Place 1 capsule (18 mcg total) into inhaler and inhale daily. 11/13/15  Yes Alexa Angela Burke, MD  glucose blood (ONE TOUCH ULTRA TEST) test strip Use as  instructed by doctor up to 3x daily,dx code 250.02 insulin requiring 11/13/15   Alexa Angela Burke, MD  Insulin Syringe-Needle U-100 31G X 5/16" 1 ML MISC Use to give Lantus Insulin daily. E11.22. 12/12/15   Florinda Marker, MD   Allergies  Allergen Reactions  . Actos [Pioglitazone] Other (See Comments)    REACTION: congestive heart failure   . Naproxen Other (See Comments)    500mg  dose made her sleep for two days  . Avandia [Rosiglitazone Maleate] Other (See Comments)    unknown  . Hydrocortisone Other (See Comments)    unknown    FAMILY HISTORY:  family history includes Asthma in her maternal aunt and maternal uncle; Diabetes in her sister; Heart disease in her father, maternal aunt, maternal grandfather, maternal uncle, paternal aunt, and paternal uncle; Seizures in her father; Stroke in her mother. There is no history of Colon cancer, Colon polyps, Esophageal cancer, Kidney disease, or Gallbladder disease. SOCIAL HISTORY:  reports that she has been smoking Cigarettes.  She has a 7 pack-year smoking history. She has never used smokeless tobacco. She reports that she does not drink alcohol or use illicit drugs.  REVIEW OF SYSTEMS:   As per HPI - All other systems reviewed and were neg.    SUBJECTIVE:   VITAL  SIGNS: Temp:  [98.4 F (36.9 C)-100.6 F (38.1 C)] 100.3 F (37.9 C) (05/11 1200) Pulse Rate:  [57-115] 111 (05/11 1200) Resp:  [18-27] 18 (05/11 1200) BP: (102-152)/(52-89) 152/89 mmHg (05/11 1200) SpO2:  [96 %-99 %] 96 % (05/11 1200) Weight:  [106.8 kg (235 lb 7.2 oz)] 106.8 kg (235 lb 7.2 oz) (05/11 0500)  PHYSICAL EXAMINATION: General:  Obese, chronically ill appearing female, NAD  Neuro:  Somnolent, slow to respond but arousable, answers all questions appropriately, oriented, non-focal, equal strength x4 HEENT:  Mm moist, no JVD  Cardiovascular:  s1s2 rrr Lungs:  resps even non labored on [place], diffuse exp wheeze  Abdomen:  Round, soft, non tender, +bs  Musculoskeletal:   Warm and dry, no edema     Recent Labs Lab 02/05/16 1159 02/05/16 1205 02/10/16 1609 02/11/16 0005  NA 140 140 131* 136  K 4.3 4.1 4.1 3.5  CL 103 101 92* 96*  CO2 25  --  26 29  BUN 8 8 42* 40*  CREATININE 0.90 0.70 3.94* 2.61*  GLUCOSE 322* 309* 369* 161*    Recent Labs Lab 02/05/16 1159 02/05/16 1205 02/10/16 1609 02/11/16 0005  HGB 14.6 17.0* 13.5 12.8  HCT 45.3 50.0* 41.7 39.7  WBC 9.2  --  15.3* 13.1*  PLT 207  --  175 165   X-ray Chest Pa And Lateral  02/10/2016  CLINICAL DATA:  Hypoxia and weakness EXAM: CHEST  2 VIEW COMPARISON:  01/03/2016 FINDINGS: LEFT-sided pacemaker with a single lead overlies enlarged cardiac silhouette. There is increased central venous congestion compared to prior. Small pleural effusions noted. IMPRESSION: Cardiomegaly with increased central venous congestion and pleural effusions consistent with congestive heart failure. Electronically Signed   By: Suzy Bouchard M.D.   On: 02/10/2016 18:12   US Renal  02/11/2016  CLINICAL DATA:  Acute kidney injury EXAM: RENAL / URINARY TRACT ULTRASOUND COMPLETE COMPARISON:  None. FINDINGS: Right Kidney: Length: 12.6 cm. Echogenicity within normal limits. No mass or hydronephrosis visualized. Left Kidney: Length: 11.4 cm. Echogenicity within normal limits. No mass or hydronephrosis visualized. Bladder: Appears normal for degree of bladder distention. IMPRESSION: Both kidneys appear normal.  No hydronephrosis. Electronically Signed   By: Andreas Newport M.D.   On: 02/11/2016 01:30   Nm Pulmonary Perf And Vent  02/11/2016  CLINICAL DATA:  Hypoxia EXAM: NUCLEAR MEDICINE VENTILATION - PERFUSION LUNG SCAN TECHNIQUE: Ventilation images were obtained in multiple projections using inhaled aerosol Tc-56m DTPA. Perfusion images were obtained in multiple projections after intravenous injection of Tc-65m MAA. RADIOPHARMACEUTICALS:  31.7 mCi Technetium-89m DTPA aerosol inhalation and 4.19 mCi Technetium-42m MAA IV  COMPARISON:  Chest x-ray from the previous day FINDINGS: Ventilation: There is adequate uptake on the ventilation images. Large cardiac shadow is noted. Some mild decreased ventilation is noted in the right middle lobe. Perfusion: Perfusion images demonstrate adequate uptake throughout both lungs with the exception of a area of decreased perfusion in the right middle lobe. This matches the ventilation images and likely related to focal atelectasis. IMPRESSION: No ventilation-perfusion mismatch to suggest pulmonary embolism is noted. There is a matched defect in the right middle lobe likely related to atelectasis. Electronically Signed   By: Inez Catalina M.D.   On: 02/11/2016 07:11   Dg Chest Port 1 View  02/11/2016  CLINICAL DATA:  Altered mental status, history of COPD, diabetes, cardiomyopathy, current smoker. EXAM: PORTABLE CHEST 1 VIEW COMPARISON:  Portable chest x-ray of Feb 10, 2016 FINDINGS: The lungs remain well-expanded. The  interstitial markings remain mildly increased. The cardiac silhouette remains enlarged and the pulmonary vascularity mildly engorged. The permanent pacemaker defibrillator is in stable position. The left internal jugular venous catheter tip projects over the proximal SVC. IMPRESSION: CHF with mild interstitial edema superimposed upon COPD. No evidence of aspiration. There has not been significant interval change since the previous study. Electronically Signed   By: David  Martinique M.D.   On: 02/11/2016 08:04   Dg Chest Port 1 View  02/10/2016  CLINICAL DATA:  65 year old female with central line placement EXAM: PORTABLE CHEST 1 VIEW COMPARISON:  Earlier Chest radiograph dated 02/10/2016 FINDINGS: There has been interval placement of a left IJ central line with tip over the upper mediastinum likely at the junction of the left innominate and SVC. There is a small left pleural effusion. Left lung base atelectasis. The right lung is clear. No pneumothorax. There is cardiomegaly. Left  pectoral AICD device. No acute osseous pathology. IMPRESSION: Left IJ central line with tip at the junction of the innominate and SVC. No pneumothorax. Electronically Signed   By: Anner Crete M.D.   On: 02/10/2016 23:44    ASSESSMENT / PLAN:  AMS -- unclear etiology.  Pt is easily arousable and oriented, non focal neuro exam.  But remains drowsy, slow to respond. Significant findings at home per daughter include tremors and a temporary L hearing loss which is now resolved. Has had neg VQ scan, neg renal u/s.  Unlikely that this is solely r/t hypercarbia although she does have mild respiratory acidosis from presumed OSA now worsened by AMS.  This is further complicated by AKI which is improving. Ammonia nml.  Does have low grade fever but no clear source of infection - no clear infiltrate on CXR, unimpressive u/a.  AKI  Acute on chronic CHF   REC -  CT head now Consider EEG  Consider neuro input  LP would be reasonable although pt's body habitus would make bedside LP difficult - would need IR Consider qhs CPAP  F/u chem  Check pct  BP control - would resume home toprol  Hold home spiriva for now - change to duonebs q6h  Blood cultures pending    Nickolas Madrid, NP 02/11/2016  4:50 PM Pager: [phone] or [phone]   Attending note: I have seen and examined the patient with nurse practitioner/resident and agree with the note. History, labs and imaging reviewed.  65 Y/O with PMH of HN, systolic heart failure, COPD, Likely OSA admitted with altered mental status. ABG reviewed. Pco2 is not markedly elevated but is higher today.  She has had a work up including recent CT head, V/Q scan, LFTs, ammonia that are normal. PCCM consulted for LP. Her neurontin has been held.   I think LP would be reasonable but her obesity would make a bedside procedure difficult. Please call IR for a guided biopsy. Consider repeating the CT and getting EEG, neuro involved again. Follow repeat  ABG. If Pco2 is increasing then use Bipap.  Rest of plan as above.  Marshell Garfinkel MD [facility] Pager [phone] If no answer or after 3pm call: [phone] 02/11/2016, 6:28 PM

## 2016-02-12 ENCOUNTER — Observation Stay (HOSPITAL_COMMUNITY): Payer: Medicare Other

## 2016-02-12 DIAGNOSIS — I110 Hypertensive heart disease with heart failure: Secondary | ICD-10-CM | POA: Diagnosis not present

## 2016-02-12 DIAGNOSIS — N178 Other acute kidney failure: Secondary | ICD-10-CM | POA: Diagnosis not present

## 2016-02-12 DIAGNOSIS — Z95810 Presence of automatic (implantable) cardiac defibrillator: Secondary | ICD-10-CM | POA: Diagnosis not present

## 2016-02-12 DIAGNOSIS — Z9981 Dependence on supplemental oxygen: Secondary | ICD-10-CM | POA: Diagnosis not present

## 2016-02-12 DIAGNOSIS — G8929 Other chronic pain: Secondary | ICD-10-CM | POA: Diagnosis not present

## 2016-02-12 DIAGNOSIS — I119 Hypertensive heart disease without heart failure: Secondary | ICD-10-CM

## 2016-02-12 DIAGNOSIS — N179 Acute kidney failure, unspecified: Secondary | ICD-10-CM | POA: Diagnosis not present

## 2016-02-12 DIAGNOSIS — J449 Chronic obstructive pulmonary disease, unspecified: Secondary | ICD-10-CM

## 2016-02-12 DIAGNOSIS — J9601 Acute respiratory failure with hypoxia: Secondary | ICD-10-CM | POA: Diagnosis not present

## 2016-02-12 DIAGNOSIS — I248 Other forms of acute ischemic heart disease: Secondary | ICD-10-CM | POA: Diagnosis not present

## 2016-02-12 DIAGNOSIS — I5023 Acute on chronic systolic (congestive) heart failure: Secondary | ICD-10-CM | POA: Diagnosis not present

## 2016-02-12 DIAGNOSIS — E1065 Type 1 diabetes mellitus with hyperglycemia: Secondary | ICD-10-CM | POA: Diagnosis not present

## 2016-02-12 DIAGNOSIS — I7389 Other specified peripheral vascular diseases: Secondary | ICD-10-CM | POA: Diagnosis not present

## 2016-02-12 DIAGNOSIS — J9602 Acute respiratory failure with hypercapnia: Secondary | ICD-10-CM | POA: Diagnosis not present

## 2016-02-12 DIAGNOSIS — R0902 Hypoxemia: Secondary | ICD-10-CM | POA: Diagnosis not present

## 2016-02-12 DIAGNOSIS — R4182 Altered mental status, unspecified: Secondary | ICD-10-CM | POA: Diagnosis not present

## 2016-02-12 DIAGNOSIS — E860 Dehydration: Secondary | ICD-10-CM | POA: Diagnosis not present

## 2016-02-12 DIAGNOSIS — I5042 Chronic combined systolic (congestive) and diastolic (congestive) heart failure: Secondary | ICD-10-CM | POA: Diagnosis not present

## 2016-02-12 DIAGNOSIS — R778 Other specified abnormalities of plasma proteins: Secondary | ICD-10-CM | POA: Diagnosis not present

## 2016-02-12 DIAGNOSIS — R5383 Other fatigue: Secondary | ICD-10-CM | POA: Diagnosis not present

## 2016-02-12 DIAGNOSIS — I491 Atrial premature depolarization: Secondary | ICD-10-CM | POA: Diagnosis not present

## 2016-02-12 DIAGNOSIS — G9340 Encephalopathy, unspecified: Secondary | ICD-10-CM | POA: Diagnosis not present

## 2016-02-12 DIAGNOSIS — E785 Hyperlipidemia, unspecified: Secondary | ICD-10-CM | POA: Diagnosis not present

## 2016-02-12 DIAGNOSIS — E1040 Type 1 diabetes mellitus with diabetic neuropathy, unspecified: Secondary | ICD-10-CM | POA: Diagnosis not present

## 2016-02-12 DIAGNOSIS — E872 Acidosis: Secondary | ICD-10-CM | POA: Diagnosis not present

## 2016-02-12 DIAGNOSIS — I429 Cardiomyopathy, unspecified: Secondary | ICD-10-CM | POA: Diagnosis not present

## 2016-02-12 DIAGNOSIS — G4733 Obstructive sleep apnea (adult) (pediatric): Secondary | ICD-10-CM | POA: Diagnosis not present

## 2016-02-12 DIAGNOSIS — I5043 Acute on chronic combined systolic (congestive) and diastolic (congestive) heart failure: Secondary | ICD-10-CM | POA: Diagnosis not present

## 2016-02-12 DIAGNOSIS — E0590 Thyrotoxicosis, unspecified without thyrotoxic crisis or storm: Secondary | ICD-10-CM | POA: Diagnosis not present

## 2016-02-12 LAB — UREA NITROGEN, URINE: Urea Nitrogen, Ur: 697 mg/dL

## 2016-02-12 LAB — BASIC METABOLIC PANEL
Anion gap: 10 (ref 5–15)
BUN: 26 mg/dL — ABNORMAL HIGH (ref 6–20)
CO2: 30 mmol/L (ref 22–32)
Calcium: 8.3 mg/dL — ABNORMAL LOW (ref 8.9–10.3)
Chloride: 99 mmol/L — ABNORMAL LOW (ref 101–111)
Creatinine, Ser: 1.25 mg/dL — ABNORMAL HIGH (ref 0.44–1.00)
GFR calc Af Amer: 52 mL/min — ABNORMAL LOW (ref >60)
GFR calc non Af Amer: 45 mL/min — ABNORMAL LOW (ref >60)
Glucose, Bld: 101 mg/dL — ABNORMAL HIGH (ref 65–99)
Potassium: 3.5 mmol/L (ref 3.5–5.1)
Sodium: 139 mmol/L (ref 135–145)

## 2016-02-12 LAB — GLUCOSE, CAPILLARY
Glucose-Capillary: 87 mg/dL (ref 65–99)
Glucose-Capillary: 202 mg/dL — ABNORMAL HIGH (ref 65–99)
Glucose-Capillary: 157 mg/dL — ABNORMAL HIGH (ref 65–99)
Glucose-Capillary: 190 mg/dL — ABNORMAL HIGH (ref 65–99)

## 2016-02-12 LAB — PROCALCITONIN: Procalcitonin: 0.61 ng/mL

## 2016-02-12 LAB — RPR: RPR Ser Ql: NONREACTIVE

## 2016-02-12 LAB — CBC
HCT: 37.1 % (ref 36.0–46.0)
Hemoglobin: 11.6 g/dL — ABNORMAL LOW (ref 12.0–15.0)
MCH: 25.2 pg — ABNORMAL LOW (ref 26.0–34.0)
MCHC: 31.3 g/dL (ref 30.0–36.0)
MCV: 80.7 fL (ref 78.0–100.0)
Platelets: 142 10*3/uL — ABNORMAL LOW (ref 150–400)
RBC: 4.6 MIL/uL (ref 3.87–5.11)
RDW: 15.3 % (ref 11.5–15.5)
WBC: 7.5 10*3/uL (ref 4.0–10.5)

## 2016-02-12 MED ORDER — FUROSEMIDE 40 MG PO TABS
40.0000 mg | ORAL_TABLET | Freq: Every day | ORAL | Status: DC
  Administered 2016-02-12: 40 mg via ORAL
  Filled 2016-02-12: qty 1

## 2016-02-12 MED ORDER — TIOTROPIUM BROMIDE MONOHYDRATE 18 MCG IN CAPS
18.0000 ug | ORAL_CAPSULE | Freq: Every day | RESPIRATORY_TRACT | Status: DC
  Filled 2016-02-12: qty 5

## 2016-02-12 MED ORDER — ENOXAPARIN SODIUM 40 MG/0.4ML ~~LOC~~ SOLN
40.0000 mg | SUBCUTANEOUS | Status: DC
  Administered 2016-02-12: 40 mg via SUBCUTANEOUS
  Filled 2016-02-12: qty 0.4

## 2016-02-12 NOTE — Progress Notes (Signed)
Patient trasfered from [name] to [phone] via bed; alert and oriented x 4; no complaints of pain; Left IJ triple lumen  NSL; skin intact. Orient patient to room and unit; instructed how to use the call bell and  fall risk precautions. Will continue to monitor the patient.

## 2016-02-12 NOTE — Progress Notes (Signed)
Report attempted.  Nurse to call back.

## 2016-02-12 NOTE — Care Management Obs Status (Signed)
MEDICARE OBSERVATION STATUS NOTIFICATION   Patient Details  Name: [name] MRN: [id number] Date of Birth: 1951-03-06   Medicare Observation Status Notification Given:  Yes    MayoKym Groom, RN 02/12/2016, 10:13 AM

## 2016-02-12 NOTE — Progress Notes (Signed)
Subjective:  Pt's mental status has remarkably improved and she was fully alert and oriented. She does not remember the events leading up to the admission.  She had underwent repeat CT head this morning.     Objective: Vital signs in last 24 hours: Filed Vitals:   02/12/16 0241 02/12/16 0426 02/12/16 0437 02/12/16 0732  BP:  114/66    Pulse:      Temp:  98.7 F (37.1 C)    TempSrc:  Oral    Resp:      Height:      Weight:   239 lb 10.2 oz (108.7 kg)   SpO2: 96% 96%  99%   Weight change: 5 lb 15.2 oz (2.7 kg)  Intake/Output Summary (Last 24 hours) at 02/12/16 1035 Last data filed at 02/12/16 1015  Gross per 24 hour  Intake    480 ml  Output    700 ml  Net   -220 ml   General: Vital signs reviewed. Patient fully alert and oriented on 3 L oxygen HEENT: PERRLA Cardiovascular: regular rate, rhythm, no murmur appreciated  Pulmonary/Chest: diffuse crackles on both sides Abdominal: obese , non-tender, non-distended, BS + Extremities:2+ pitting edema  Skin: Warm, dry and intact. No rashes or erythema. Neurologic exam : MS: A&O to person, time, place CN II-XII grossly intact Motor: 5/5 strength in upper, 5/5 in lower extremities, normal muscle tone     Lab Results: Results for orders placed or performed during the hospital encounter of 02/10/16 (from the past 24 hour(s))  RPR     Status: None   Collection Time: 02/11/16 12:59 PM  Result Value Ref Range   RPR Ser Ql Non Reactive Non Reactive  Vitamin B12     Status: None   Collection Time: 02/11/16 12:59 PM  Result Value Ref Range   Vitamin B-12 348 180 - 914 pg/mL  Glucose, capillary     Status: None   Collection Time: 02/11/16  1:19 PM  Result Value Ref Range   Glucose-Capillary 72 65 - 99 mg/dL  Troponin I (q 6hr x 3)     Status: Abnormal   Collection Time: 02/11/16  2:30 PM  Result Value Ref Range   Troponin I 0.04 (H) <0.031 ng/mL  Procalcitonin - Baseline     Status: None   Collection Time: 02/11/16   2:30 PM  Result Value Ref Range   Procalcitonin 0.68 ng/mL  Glucose, capillary     Status: Abnormal   Collection Time: 02/11/16  3:20 PM  Result Value Ref Range   Glucose-Capillary 161 (H) 65 - 99 mg/dL  Glucose, capillary     Status: Abnormal   Collection Time: 02/11/16  5:54 PM  Result Value Ref Range   Glucose-Capillary 141 (H) 65 - 99 mg/dL  Carboxyhemoglobin     Status: None   Collection Time: 02/11/16  8:35 PM  Result Value Ref Range   Total hemoglobin 12.8 12.0 - 16.0 g/dL   O2 Saturation 73.8 %   Carboxyhemoglobin 1.4 0.5 - 1.5 %   Methemoglobin 0.9 0.0 - 1.5 %  Glucose, capillary     Status: None   Collection Time: 02/11/16 11:26 PM  Result Value Ref Range   Glucose-Capillary 94 65 - 99 mg/dL  CBC     Status: Abnormal   Collection Time: 02/12/16  4:38 AM  Result Value Ref Range   WBC 7.5 4.0 - 10.5 K/uL   RBC 4.60 3.87 - 5.11 MIL/uL   Hemoglobin 11.6 (  L) 12.0 - 15.0 g/dL   HCT 37.1 36.0 - 46.0 %   MCV 80.7 78.0 - 100.0 fL   MCH 25.2 (L) 26.0 - 34.0 pg   MCHC 31.3 30.0 - 36.0 g/dL   RDW 15.3 11.5 - 15.5 %   Platelets 142 (L) 150 - 400 K/uL  Basic metabolic panel     Status: Abnormal   Collection Time: 02/12/16  4:38 AM  Result Value Ref Range   Sodium 139 135 - 145 mmol/L   Potassium 3.5 3.5 - 5.1 mmol/L   Chloride 99 (L) 101 - 111 mmol/L   CO2 30 22 - 32 mmol/L   Glucose, Bld 101 (H) 65 - 99 mg/dL   BUN 26 (H) 6 - 20 mg/dL   Creatinine, Ser 1.25 (H) 0.44 - 1.00 mg/dL   Calcium 8.3 (L) 8.9 - 10.3 mg/dL   GFR calc non Af Amer 45 (L) >60 mL/min   GFR calc Af Amer 52 (L) >60 mL/min   Anion gap 10 5 - 15  Procalcitonin     Status: None   Collection Time: 02/12/16  4:38 AM  Result Value Ref Range   Procalcitonin 0.61 ng/mL  Glucose, capillary     Status: None   Collection Time: 02/12/16  8:27 AM  Result Value Ref Range   Glucose-Capillary 87 65 - 99 mg/dL      Micro Results: Recent Results (from the past 240 hour(s))  MRSA PCR Screening     Status:  None   Collection Time: 02/10/16 10:14 PM  Result Value Ref Range Status   MRSA by PCR NEGATIVE NEGATIVE Final    Comment:        The GeneXpert MRSA Assay (FDA approved for NASAL specimens only), is one component of a comprehensive MRSA colonization surveillance program. It is not intended to diagnose MRSA infection nor to guide or monitor treatment for MRSA infections.    Studies/Results: X-ray Chest Pa And Lateral  02/10/2016  CLINICAL DATA:  Hypoxia and weakness EXAM: CHEST  2 VIEW COMPARISON:  01/03/2016 FINDINGS: LEFT-sided pacemaker with a single lead overlies enlarged cardiac silhouette. There is increased central venous congestion compared to prior. Small pleural effusions noted. IMPRESSION: Cardiomegaly with increased central venous congestion and pleural effusions consistent with congestive heart failure. Electronically Signed   By: Suzy Bouchard M.D.   On: 02/10/2016 18:12   Ct Head Wo Contrast  02/12/2016  CLINICAL DATA:  Altered mental status. Diabetes. Patient not a candidate for MR due to implanted cardiac device. EXAM: CT HEAD WITHOUT CONTRAST TECHNIQUE: Contiguous axial images were obtained from the base of the skull through the vertex without intravenous contrast. COMPARISON:  02/05/2016. FINDINGS: No evidence for acute infarction, hemorrhage, mass lesion, hydrocephalus, or extra-axial fluid. Normal cerebral volume. Hypoattenuation of white matter representing chronic ischemic change. Calvarium intact. Slight layering fluid is suspected in the RIGHT maxillary sinus. Chronic mastoid disease. No orbital findings. Vascular calcification. Similar appearance to priors. IMPRESSION: No acute intracranial findings. Small vessel disease. Stable appearance from priors. Electronically Signed   By: Staci Righter M.D.   On: 02/12/2016 07:38   US Renal  02/11/2016  CLINICAL DATA:  Acute kidney injury EXAM: RENAL / URINARY TRACT ULTRASOUND COMPLETE COMPARISON:  None. FINDINGS: Right  Kidney: Length: 12.6 cm. Echogenicity within normal limits. No mass or hydronephrosis visualized. Left Kidney: Length: 11.4 cm. Echogenicity within normal limits. No mass or hydronephrosis visualized. Bladder: Appears normal for degree of bladder distention. IMPRESSION: Both kidneys appear normal.  No hydronephrosis. Electronically Signed   By: Andreas Newport M.D.   On: 02/11/2016 01:30   Nm Pulmonary Perf And Vent  02/11/2016  CLINICAL DATA:  Hypoxia EXAM: NUCLEAR MEDICINE VENTILATION - PERFUSION LUNG SCAN TECHNIQUE: Ventilation images were obtained in multiple projections using inhaled aerosol Tc-19m DTPA. Perfusion images were obtained in multiple projections after intravenous injection of Tc-46m MAA. RADIOPHARMACEUTICALS:  31.7 mCi Technetium-73m DTPA aerosol inhalation and 4.19 mCi Technetium-95m MAA IV COMPARISON:  Chest x-ray from the previous day FINDINGS: Ventilation: There is adequate uptake on the ventilation images. Large cardiac shadow is noted. Some mild decreased ventilation is noted in the right middle lobe. Perfusion: Perfusion images demonstrate adequate uptake throughout both lungs with the exception of a area of decreased perfusion in the right middle lobe. This matches the ventilation images and likely related to focal atelectasis. IMPRESSION: No ventilation-perfusion mismatch to suggest pulmonary embolism is noted. There is a matched defect in the right middle lobe likely related to atelectasis. Electronically Signed   By: Inez Catalina M.D.   On: 02/11/2016 07:11   Dg Chest Port 1 View  02/11/2016  CLINICAL DATA:  Altered mental status, history of COPD, diabetes, cardiomyopathy, current smoker. EXAM: PORTABLE CHEST 1 VIEW COMPARISON:  Portable chest x-ray of Feb 10, 2016 FINDINGS: The lungs remain well-expanded. The interstitial markings remain mildly increased. The cardiac silhouette remains enlarged and the pulmonary vascularity mildly engorged. The permanent pacemaker defibrillator  is in stable position. The left internal jugular venous catheter tip projects over the proximal SVC. IMPRESSION: CHF with mild interstitial edema superimposed upon COPD. No evidence of aspiration. There has not been significant interval change since the previous study. Electronically Signed   By: David  Martinique M.D.   On: 02/11/2016 08:04   Dg Chest Port 1 View  02/10/2016  CLINICAL DATA:  65 year old female with central line placement EXAM: PORTABLE CHEST 1 VIEW COMPARISON:  Earlier Chest radiograph dated 02/10/2016 FINDINGS: There has been interval placement of a left IJ central line with tip over the upper mediastinum likely at the junction of the left innominate and SVC. There is a small left pleural effusion. Left lung base atelectasis. The right lung is clear. No pneumothorax. There is cardiomegaly. Left pectoral AICD device. No acute osseous pathology. IMPRESSION: Left IJ central line with tip at the junction of the innominate and SVC. No pneumothorax. Electronically Signed   By: Anner Crete M.D.   On: 02/10/2016 23:44   Medications: I have reviewed the patient's current medications. Scheduled Meds: . aspirin EC  81 mg Oral Daily  . atorvastatin  80 mg Oral QHS  . furosemide  40 mg Oral Daily  . insulin aspart  0-15 Units Subcutaneous TID WC  . insulin aspart  0-5 Units Subcutaneous QHS  . insulin glargine  10 Units Subcutaneous Daily  . ipratropium  0.5 mg Nebulization Q6H  . levalbuterol  0.63 mg Nebulization Q6H  . metoprolol succinate  50 mg Oral Daily  . sodium chloride flush  3 mL Intravenous Q12H   Continuous Infusions:  PRN Meds:.acetaminophen **OR** acetaminophen, albuterol, technetium TC 59M diethylenetriame-pentaacetic acid Assessment/Plan: Active Problems:   Acute encephalopathy   AKI (acute kidney injury) ([place])   Altered mental status   Encounter for central line placement   Hypoxia   Uncontrolled type 1 diabetes with diabetic neuropathy (HCC)   CHF (congestive  heart failure), NYHA class II ([place])   Acute on chronic systolic CHF (congestive heart failure) ([place])  Acute encephalopathy: Patient  presented with severe hypoxia with only mild hypercarbia. On her repeat ABG, the CO2 increased when oxygen was administered. We did a VQ scan on her which showed no evidence for acute pulmonary embolus or left to right shunt. However, patient ran low grade fevers to 100.6 on 5/11.  However, she denies any nuchal rigidity. On 5/12, We held off on the LP because patient's neurologic status improved dramatically, she has been afebrile, and her lab work has been pretty unremarkable.  Patient has done a sleep study at one point, but denies ever using CPAP. She will benefit from a repeat split night sleep study. Blood cultures are pending.  Because her neurologic function has improved, we do not believe an EEG is needed now. Also, a repeat CT head this morning showed no acute abnormality. We monitor her neurologic function overnight before discharging her tomorrow.    -transfer to med-surg -maintain O2 sats between 88-92% -f/u blood cultures  -cancelled LP   Acute HFrEF: Echo with bubble study showed EF of 20-25% which was severly reduced compared to the prior one from 2015 which showed EF of 40%. She has signs of heart failure with crackles on exam and 2+ pitting edema. However, her BNP is 25 which is very surprising. She has an AICD in place. She continues to deny chest pain.  -consulted cardiology/heart failure team for help with fluid management  -hold lasix today per cardiology    Acute renal failure: Cr on admission was markedly elevated, which went down after receiving fluid. FeNa was 0.3. She also received IV contrast dye 5 days ago. A renal ultrasound was unremarkable. Baseline cr of 0.9. Her creatinine improved to 1.25 today.  -hold lasix today per cardiology    Elevated troponin levels: We trended troponins and they peaked at 0.17before downtrending to 0.04.  Likely due to AKI.  She continues to deny chest pain.    COPD: PFTs in Sept 2016 showing reduced FEV1 and increased FEV1 to FVC ratio.  -spiriva -albuterol  T2DM: CBGs well controlled -lantus 10 units -SSI-M  HTN: pressures are well controlled     Diet: carb modified     Dispo: Disposition is deferred at this time, awaiting improvement of current medical problems.  Anticipated discharge in approximately 3 day(s).   The patient does have a current PCP (Alexa Angela Burke, MD) and does need an [facility] hospital follow-up appointment after discharge.  The patient does not have transportation limitations that hinder transportation to clinic appointments.  .Services Needed at time of discharge: Y = Yes, Blank = No PT:   OT:   RN:   Equipment:   Other:     LOS: 2 days   Burgess Estelle, MD 02/12/2016, 10:35 AM

## 2016-02-12 NOTE — Progress Notes (Signed)
PATIENT ID:  [id number] with chronic systolic and diastolic heart failure s/p ICD (LVEF 20-25% this admission compared with 35-40% in 2015), hypertension, diabetes, OSA/600 at bedtime, COPD on 2 L home O2 admitted with lethargy.   SUBJECTIVE:  Feeling well this AM.  She does not remember being confused on admission.  Her son notes that she was not eating or drinking well for 2 days prior to admission but continued to take her medicines, including Lasix. This morning she denies any chest pain or shortness of breath.    PHYSICAL EXAM Filed Vitals:   02/12/16 0241 02/12/16 0426 02/12/16 0437 02/12/16 0732  BP:  114/66    Pulse:      Temp:  98.7 F (37.1 C)    TempSrc:  Oral    Resp:      Height:      Weight:   108.7 kg (239 lb 10.2 oz)   SpO2: 96% 96%  99%   Geneneral: Well-appearing. No acute distress. Neck:  No JVD. Heart:  RRR.  No m/r/g. Lungs: Mild, diffuse expiratory wheezes Abdomen:  Soft, NT, ND.+BS  Extremities:  WWP.  No edema.   LABS: Lab Results  Component Value Date   TROPONINI 0.04* 02/11/2016   Results for orders placed or performed during the hospital encounter of 02/10/16 (from the past 24 hour(s))  RPR     Status: None   Collection Time: 02/11/16 12:59 PM  Result Value Ref Range   RPR Ser Ql Non Reactive Non Reactive  Vitamin B12     Status: None   Collection Time: 02/11/16 12:59 PM  Result Value Ref Range   Vitamin B-12 348 180 - 914 pg/mL  Glucose, capillary     Status: None   Collection Time: 02/11/16  1:19 PM  Result Value Ref Range   Glucose-Capillary 72 65 - 99 mg/dL  Troponin I (q 6hr x 3)     Status: Abnormal   Collection Time: 02/11/16  2:30 PM  Result Value Ref Range   Troponin I 0.04 (H) <0.031 ng/mL  Procalcitonin - Baseline     Status: None   Collection Time: 02/11/16  2:30 PM  Result Value Ref Range   Procalcitonin 0.68 ng/mL  Glucose, capillary     Status: Abnormal   Collection Time: 02/11/16  3:20 PM  Result Value Ref Range   Glucose-Capillary 161 (H) 65 - 99 mg/dL  Glucose, capillary     Status: Abnormal   Collection Time: 02/11/16  5:54 PM  Result Value Ref Range   Glucose-Capillary 141 (H) 65 - 99 mg/dL  Carboxyhemoglobin     Status: None   Collection Time: 02/11/16  8:35 PM  Result Value Ref Range   Total hemoglobin 12.8 12.0 - 16.0 g/dL   O2 Saturation 73.8 %   Carboxyhemoglobin 1.4 0.5 - 1.5 %   Methemoglobin 0.9 0.0 - 1.5 %  Glucose, capillary     Status: None   Collection Time: 02/11/16 11:26 PM  Result Value Ref Range   Glucose-Capillary 94 65 - 99 mg/dL  CBC     Status: Abnormal   Collection Time: 02/12/16  4:38 AM  Result Value Ref Range   WBC 7.5 4.0 - 10.5 K/uL   RBC 4.60 3.87 - 5.11 MIL/uL   Hemoglobin 11.6 (L) 12.0 - 15.0 g/dL   HCT 37.1 36.0 - 46.0 %   MCV 80.7 78.0 - 100.0 fL   MCH 25.2 (L) 26.0 - 34.0 pg   MCHC 31.3  30.0 - 36.0 g/dL   RDW 15.3 11.5 - 15.5 %   Platelets 142 (L) 150 - 400 K/uL  Basic metabolic panel     Status: Abnormal   Collection Time: 02/12/16  4:38 AM  Result Value Ref Range   Sodium 139 135 - 145 mmol/L   Potassium 3.5 3.5 - 5.1 mmol/L   Chloride 99 (L) 101 - 111 mmol/L   CO2 30 22 - 32 mmol/L   Glucose, Bld 101 (H) 65 - 99 mg/dL   BUN 26 (H) 6 - 20 mg/dL   Creatinine, Ser 1.25 (H) 0.44 - 1.00 mg/dL   Calcium 8.3 (L) 8.9 - 10.3 mg/dL   GFR calc non Af Amer 45 (L) >60 mL/min   GFR calc Af Amer 52 (L) >60 mL/min   Anion gap 10 5 - 15  Procalcitonin     Status: None   Collection Time: 02/12/16  4:38 AM  Result Value Ref Range   Procalcitonin 0.61 ng/mL  Glucose, capillary     Status: None   Collection Time: 02/12/16  8:27 AM  Result Value Ref Range   Glucose-Capillary 87 65 - 99 mg/dL    Intake/Output Summary (Last 24 hours) at 02/12/16 0911 Last data filed at 02/12/16 0825  Gross per 24 hour  Intake    480 ml  Output    600 ml  Net   -120 ml    Telemetry: sinus rhythm.  PVCs  ASSESSMENT AND PLAN:  Active Problems:   Acute  encephalopathy   AKI (acute kidney injury) ([place])   Altered mental status   Encounter for central line placement   Hypoxia   Uncontrolled type 1 diabetes with diabetic neuropathy (HCC)   CHF (congestive heart failure), NYHA class II ([place])   Acute on chronic systolic CHF (congestive heart failure) ([place])   # Chronic systolic and diastolic heart failure: Ms. Batte is euvolemic on exam.  LVEF was incidentally noted to be newly reduced on this admission. However, she is not having symptoms of heart failure and appears to be euvolemic on exam. I suspect that her acute renal failure is due to her continuing to take Lasix despite having adequate by mouth intake. Renal function is improving with holding Lasix and giving IV fluids. We will continue to hold Lasix again today.   # Hypertension: BP well-controlled. Continue metoprolol.    # Demand ischemia: She continues to deny chest pain.  Likely elevated in the setting of AKI.  # AMS: Appears to be improving.  Per primary team.   # Acute renal failure: Improving.  Holding home lasix as above.     Jemarion Roycroft C. Oval Linsey, MD, [facility] 02/12/2016 9:11 AM

## 2016-02-12 NOTE — Addendum Note (Signed)
Addended by: Forde Dandy on: 02/12/2016 10:38 AM   Modules accepted: Orders, Medications

## 2016-02-12 NOTE — Progress Notes (Signed)
Name: [name] MRN: [id number] DOB: [birth year]    ADMISSION DATE:  02/10/2016 CONSULTATION DATE:  5/11/ 2017  REFERRING MD :  IMTS  CHIEF COMPLAINT:  AMS   BRIEF PATIENT DESCRIPTION: 65yo female smoker with hx combined heart failure (EF this admit 20-25%), HTN, uncontrolled DM, COPD on home O2, probable OSA, chronic pain who was admitted 5/10 from clinic due to lethargy.  Found to have AKI, hypoxia, AMS, leukocytosis.  Initially suspected that AMS was r/t hypercarbia in setting untreated OSA but PCO2 not significantly elevated.  VQ scan neg.    SIGNIFICANT EVENTS    STUDIES:  5/11 Echo with bubble study>>> EF 20-25%, severe global reduction in LV function, grade 1 diastolic dysfunction, neg bubble study  VQ scan 5/11>>> No ventilation-perfusion mismatch to suggest pulmonary embolism is noted. There is a matched defect in the right middle lobe likely related to atelectasis. Renal u/s 5/11>>> Both kidneys appear normal. No hydronephrosis. 02/11/2016: CT Head>> No acute intracranial findings, small vessel disease, no change from previous findings. 5/12/  Procalcitonin >>  0.61 down trending from 0.68 5/11  HISTORY OF PRESENT ILLNESS:  65yo female with hx combined heart failure (EF this admit 20-25%), HTN, uncontrolled DM, COPD on home O2, probable OSA, chronic pain who was admitted 5/10 from clinic due to lethargy.  Found to have AKI, hypoxia, AMS, leukocytosis.  Initially suspected that AMS was r/t hypercarbia in setting untreated OSA but PCO2 not significantly elevated.  VQ scan neg.  Per daughter has been increasingly sleepy at home over last few days but eating normally.  C/o back and leg pain which she sought care for in ER x 2 - once here, once at [facility] - without any significant findings.    Other notable symptoms over last 3-4 days have been tremors (usually when holding a cup or trying to eat) and a spontaneous hearing loss in the left ear which resolved onits own after 24  hours.   Pt has hx of ETOH but has not had a drink in 3-4 months, no known hx liver disease.   Denies fevers, chills, headache, syncope, chest pain, hemoptysis, night sweats, purulent sputum, dysuria.   PAST MEDICAL HISTORY :   has a past medical history of Hyperthyroidism, subclinical; Post menopausal syndrome; Obesity; Cardiac defibrillator in situ; Eczema; Lipoma; Chronic ulcer of leg ([place]); Hyperlipidemia; Chronic combined systolic and diastolic heart failure ([place]); NICM (nonischemic cardiomyopathy) ([place]); Diabetes mellitus; HTN (hypertension); Elevated alkaline phosphatase level; cardiac cath; Sleep apnea; COPD (chronic obstructive pulmonary disease) ([place]); Automatic implantable cardioverter-defibrillator in situ; CHF (congestive heart failure) ([place]); Depression; Implantable cardioverter-defibrillator (ICD) generator end of life; Colon polyps (04/12/2013); History of oxygen administration; and ATN (acute tubular necrosis) ([place]) (07/15/2014).  has past surgical history that includes Cardiac defibrillator placement (05/04/2007); Hysteroscopy; Cardiac defibrillator placement; Abdominal hysterectomy; Cardiac catheterization; Insert / replace / remove pacemaker; Tubal ligation; Hernia repair; Colonoscopy (N/A, 04/12/2013); left heart catheterization with coronary angiogram (N/A, 04/02/2012); implantable cardioverter defibrillator (icd) generator change (N/A, 04/02/2014); Esophagogastroduodenoscopy (N/A, 04/16/2015); and Colonoscopy (N/A, 04/16/2015). Prior to Admission medications   Medication Sig Start Date End Date Taking? Authorizing Provider  albuterol (PROVENTIL HFA;VENTOLIN HFA) 108 (90 Base) MCG/ACT inhaler Inhale 2 puffs into the lungs every 6 (six) hours as needed for wheezing or shortness of breath. 11/13/15  Yes Alexa Angela Burke, MD  albuterol (PROVENTIL) (2.5 MG/3ML) 0.083% nebulizer solution USE 1 VIAL VIA NEBULIZER EVERY 6 HOURS AS NEEDED FOR WHEEZING OR SHORTNESS OF BREATH 11/13/15  Yes Alexa  Angela Burke,  MD  amLODipine-olmesartan (AZOR) 10-40 MG tablet Take 1 tablet by mouth daily. 12/07/15  Yes Alexa Angela Burke, MD  aspirin EC 81 MG tablet Take 1 tablet (81 mg total) by mouth daily. 11/13/15  Yes Alexa Angela Burke, MD  atorvastatin (LIPITOR) 80 MG tablet Take 1 tablet (80 mg total) by mouth at bedtime. 11/13/15  Yes Alexa Angela Burke, MD  furosemide (LASIX) 40 MG tablet Take 1 tablet (40 mg total) by mouth daily. Place Rx on file (filled Rx on 11/11/15) 11/13/15  Yes Alexa Angela Burke, MD  gabapentin (NEURONTIN) 300 MG capsule Take 2 capsules (600 mg total) by mouth 3 (three) times daily. Place Rx on file (picked up Rx on 11/11/15) Patient taking differently: Take 300 mg by mouth at bedtime. Place Rx on file (picked up Rx on 11/11/15) 11/13/15  Yes Alexa Angela Burke, MD  insulin glargine (LANTUS) 100 UNIT/ML injection Inject 0.6 mLs (60 Units total) into the skin daily. 11/21/15  Yes Alexa Angela Burke, MD  insulin lispro (HUMALOG KWIKPEN) 100 UNIT/ML KiwkPen Inject 0.1 mLs (10 Units total) into the skin 3 (three) times daily. 12/09/15  Yes Alexa Angela Burke, MD  metFORMIN (GLUCOPHAGE-XR) 500 MG 24 hr tablet Take 4 tablets (2,000 mg total) by mouth daily with breakfast. 12/09/15  Yes Alexa Angela Burke, MD  metoprolol succinate (TOPROL-XL) 50 MG 24 hr tablet Take 1 tablet (50 mg total) by mouth daily. 11/13/15  Yes Alexa Angela Burke, MD  naproxen (NAPROSYN) 500 MG tablet Take 1 tablet (500 mg total) by mouth 2 (two) times daily as needed for moderate pain. 01/28/16  Yes Alexa Angela Burke, MD  nitroGLYCERIN (NITROSTAT) 0.4 MG SL tablet Place 0.4 mg under the tongue every 5 (five) minutes as needed for chest pain.   Yes Historical Provider, MD  omeprazole (PRILOSEC) 20 MG capsule Take 1 capsule (20 mg total) by mouth 2 (two) times daily. 02/05/16  Yes Tanna Furry, MD  ondansetron (ZOFRAN ODT) 4 MG disintegrating tablet Take 1 tablet (4 mg total) by mouth every 8 (eight) hours as needed for nausea. 02/05/16  Yes Tanna Furry, MD  tiotropium (SPIRIVA) 18 MCG inhalation  capsule Place 1 capsule (18 mcg total) into inhaler and inhale daily. 11/13/15  Yes Alexa Angela Burke, MD  glucose blood (ONE TOUCH ULTRA TEST) test strip Use as instructed by doctor up to 3x daily,dx code 250.02 insulin requiring 11/13/15   Alexa Angela Burke, MD  Insulin Syringe-Needle U-100 31G X 5/16" 1 ML MISC Use to give Lantus Insulin daily. E11.22. 12/12/15   Florinda Marker, MD   Allergies  Allergen Reactions  . Actos [Pioglitazone] Other (See Comments)    REACTION: congestive heart failure   . Naproxen Other (See Comments)    500mg  dose made her sleep for two days  . Rosiglitazone Hives  . Hydrocortisone Other (See Comments)    unknown    FAMILY HISTORY:  family history includes Asthma in her maternal aunt and maternal uncle; Diabetes in her sister; Heart disease in her father, maternal aunt, maternal grandfather, maternal uncle, paternal aunt, and paternal uncle; Seizures in her father; Stroke in her mother. There is no history of Colon cancer, Colon polyps, Esophageal cancer, Kidney disease, or Gallbladder disease. SOCIAL HISTORY:  reports that she has been smoking Cigarettes.  She has a 7 pack-year smoking history. She has never used smokeless tobacco. She reports that she does not drink alcohol or use illicit drugs.  REVIEW OF SYSTEMS:  As per HPI - All other systems reviewed and were neg.    SUBJECTIVE: Awake and alert to person, place and time. Looking forward to being transferred out of SDU and going home.  VITAL SIGNS: Temp:  [98.7 F (37.1 C)-100.3 F (37.9 C)] 98.7 F (37.1 C) (05/12 0426) Pulse Rate:  [96-111] 96 (05/11 2000) Resp:  [17-22] 17 (05/12 0000) BP: (108-152)/(65-89) 114/66 mmHg (05/12 0426) SpO2:  [96 %-99 %] 99 % (05/12 0732) Weight:  [239 lb 10.2 oz (108.7 kg)] 239 lb 10.2 oz (108.7 kg) (05/12 0437)  PHYSICAL EXAMINATION: General:  Obese, chronically ill appearing female, NAD  Neuro: Awake, alert and oriented x 3. MAE x 4, appropriate HEENT:  Mm moist,  mild JVD  Cardiovascular:  s1s2 rrr Lungs:  resps even non labored on [place], diffuse exp wheezing noted.  Abdomen:  Round, soft, non tender, +bs  Musculoskeletal:  Warm and dry, no edema     Recent Labs Lab 02/10/16 1609 02/11/16 0005 02/12/16 0438  NA 131* 136 139  K 4.1 3.5 3.5  CL 92* 96* 99*  CO2 26 29 30   BUN 42* 40* 26*  CREATININE 3.94* 2.61* 1.25*  GLUCOSE 369* 161* 101*    Recent Labs Lab 02/10/16 1609 02/11/16 0005 02/12/16 0438  HGB 13.5 12.8 11.6*  HCT 41.7 39.7 37.1  WBC 15.3* 13.1* 7.5  PLT 175 165 142*   X-ray Chest Pa And Lateral  02/10/2016  CLINICAL DATA:  Hypoxia and weakness EXAM: CHEST  2 VIEW COMPARISON:  01/03/2016 FINDINGS: LEFT-sided pacemaker with a single lead overlies enlarged cardiac silhouette. There is increased central venous congestion compared to prior. Small pleural effusions noted. IMPRESSION: Cardiomegaly with increased central venous congestion and pleural effusions consistent with congestive heart failure. Electronically Signed   By: Suzy Bouchard M.D.   On: 02/10/2016 18:12   Ct Head Wo Contrast  02/12/2016  CLINICAL DATA:  Altered mental status. Diabetes. Patient not a candidate for MR due to implanted cardiac device. EXAM: CT HEAD WITHOUT CONTRAST TECHNIQUE: Contiguous axial images were obtained from the base of the skull through the vertex without intravenous contrast. COMPARISON:  02/05/2016. FINDINGS: No evidence for acute infarction, hemorrhage, mass lesion, hydrocephalus, or extra-axial fluid. Normal cerebral volume. Hypoattenuation of white matter representing chronic ischemic change. Calvarium intact. Slight layering fluid is suspected in the RIGHT maxillary sinus. Chronic mastoid disease. No orbital findings. Vascular calcification. Similar appearance to priors. IMPRESSION: No acute intracranial findings. Small vessel disease. Stable appearance from priors. Electronically Signed   By: Staci Righter M.D.   On: 02/12/2016 07:38    US Renal  02/11/2016  CLINICAL DATA:  Acute kidney injury EXAM: RENAL / URINARY TRACT ULTRASOUND COMPLETE COMPARISON:  None. FINDINGS: Right Kidney: Length: 12.6 cm. Echogenicity within normal limits. No mass or hydronephrosis visualized. Left Kidney: Length: 11.4 cm. Echogenicity within normal limits. No mass or hydronephrosis visualized. Bladder: Appears normal for degree of bladder distention. IMPRESSION: Both kidneys appear normal.  No hydronephrosis. Electronically Signed   By: Andreas Newport M.D.   On: 02/11/2016 01:30   Nm Pulmonary Perf And Vent  02/11/2016  CLINICAL DATA:  Hypoxia EXAM: NUCLEAR MEDICINE VENTILATION - PERFUSION LUNG SCAN TECHNIQUE: Ventilation images were obtained in multiple projections using inhaled aerosol Tc-50m DTPA. Perfusion images were obtained in multiple projections after intravenous injection of Tc-11m MAA. RADIOPHARMACEUTICALS:  31.7 mCi Technetium-60m DTPA aerosol inhalation and 4.19 mCi Technetium-47m MAA IV COMPARISON:  Chest x-ray from the previous day FINDINGS: Ventilation: There is  adequate uptake on the ventilation images. Large cardiac shadow is noted. Some mild decreased ventilation is noted in the right middle lobe. Perfusion: Perfusion images demonstrate adequate uptake throughout both lungs with the exception of a area of decreased perfusion in the right middle lobe. This matches the ventilation images and likely related to focal atelectasis. IMPRESSION: No ventilation-perfusion mismatch to suggest pulmonary embolism is noted. There is a matched defect in the right middle lobe likely related to atelectasis. Electronically Signed   By: Inez Catalina M.D.   On: 02/11/2016 07:11   Dg Chest Port 1 View  02/11/2016  CLINICAL DATA:  Altered mental status, history of COPD, diabetes, cardiomyopathy, current smoker. EXAM: PORTABLE CHEST 1 VIEW COMPARISON:  Portable chest x-ray of Feb 10, 2016 FINDINGS: The lungs remain well-expanded. The interstitial markings  remain mildly increased. The cardiac silhouette remains enlarged and the pulmonary vascularity mildly engorged. The permanent pacemaker defibrillator is in stable position. The left internal jugular venous catheter tip projects over the proximal SVC. IMPRESSION: CHF with mild interstitial edema superimposed upon COPD. No evidence of aspiration. There has not been significant interval change since the previous study. Electronically Signed   By: David  Martinique M.D.   On: 02/11/2016 08:04   Dg Chest Port 1 View  02/10/2016  CLINICAL DATA:  65 year old female with central line placement EXAM: PORTABLE CHEST 1 VIEW COMPARISON:  Earlier Chest radiograph dated 02/10/2016 FINDINGS: There has been interval placement of a left IJ central line with tip over the upper mediastinum likely at the junction of the left innominate and SVC. There is a small left pleural effusion. Left lung base atelectasis. The right lung is clear. No pneumothorax. There is cardiomegaly. Left pectoral AICD device. No acute osseous pathology. IMPRESSION: Left IJ central line with tip at the junction of the innominate and SVC. No pneumothorax. Electronically Signed   By: Anner Crete M.D.   On: 02/10/2016 23:44    ASSESSMENT / PLAN:  AMS -- unclear etiology.  Pt is easily arousable and oriented, non focal neuro exam. She is no longer drowsy this morning. Very awake and alert.Significant findings at home per daughter include tremors and a temporary L hearing loss which is now resolved. Has had neg VQ scan, neg renal u/s., neg CT head  Unlikely that this is solely r/t hypercarbia although she does have mild respiratory acidosis from presumed OSA now worsened by AMS.  This is further complicated by AKI which is improving. Ammonia nml. Had low grade fever, but afebrile today, no clear source of infection - no clear infiltrate on CXR, unimpressive u/a.  AKI  Acute on chronic CHF   REC -  Continued neuro monitoring Consider EEG if patient  does not  continue to improve Consider neuro input if patient does not continue to improve. LP per IR has been cancelled per  Attending team   due to improved neuro status F/u chem per attending Trend PCT Follow ABG's as necessary Consider qhs CPAP  Consider out patient follow up of OSA BP control - would resume home toprol  Hold home spiriva for now - change to duonebs q6h  Blood cultures pending/ follow    65 Y/O with PMH of HN, systolic heart failure, COPD, Likely OSA admitted with altered mental status. ABG reviewed. Pco2 is not markedly elevated but was higher 5/11.  She has had a work up including recent CT head, V/Q scan, LFTs, ammonia that are normal. PCCM consulted for LP, which has subsequently been  deemed unnecessary as patient has improved overnight.. Her neurontin was held with improvement in neuro status.   Pt. For transfer to med surg bed per attending team. LP/ IR cancelled per attending team. CCM will sign off as patient is improving. Please re-consult if we can be of any help in this patient's care in the future   Magdalen Spatz, AGACNP-BC [place] Pager: [phone] 02/12/2016, 9:01 AM

## 2016-02-12 NOTE — Consult Note (Signed)
   [facility] Inpatient Consult   02/12/2016  CYDNEE BORTH Nov 01, 1950 [id number]   Patient was assessed for [place] [facility] for community services. Patient was previously active with [facility] under patient's [place] Registry.  Spoke with inpatient RNCM and patient is currently being transferred from current unit and is in patient care.  Will follow for progress and disposition needs with the appropriate care management team, if time allows.   Of note, [facility] Care Management services does not replace or interfere with any services that are arranged by inpatient case management or social work. For additional questions or referrals please contact:  Natividad Brood, RN BSN [facility] Liaison  [phone] business mobile phone Toll free office [phone]

## 2016-02-13 ENCOUNTER — Encounter (HOSPITAL_COMMUNITY): Payer: Self-pay | Admitting: *Deleted

## 2016-02-13 ENCOUNTER — Other Ambulatory Visit: Payer: Self-pay | Admitting: Cardiology

## 2016-02-13 DIAGNOSIS — N179 Acute kidney failure, unspecified: Secondary | ICD-10-CM

## 2016-02-13 DIAGNOSIS — E1065 Type 1 diabetes mellitus with hyperglycemia: Secondary | ICD-10-CM

## 2016-02-13 DIAGNOSIS — E0590 Thyrotoxicosis, unspecified without thyrotoxic crisis or storm: Secondary | ICD-10-CM | POA: Diagnosis not present

## 2016-02-13 DIAGNOSIS — E1040 Type 1 diabetes mellitus with diabetic neuropathy, unspecified: Secondary | ICD-10-CM | POA: Diagnosis not present

## 2016-02-13 DIAGNOSIS — G9340 Encephalopathy, unspecified: Secondary | ICD-10-CM | POA: Diagnosis not present

## 2016-02-13 DIAGNOSIS — I5042 Chronic combined systolic (congestive) and diastolic (congestive) heart failure: Secondary | ICD-10-CM | POA: Diagnosis not present

## 2016-02-13 DIAGNOSIS — Z95810 Presence of automatic (implantable) cardiac defibrillator: Secondary | ICD-10-CM | POA: Diagnosis not present

## 2016-02-13 DIAGNOSIS — I110 Hypertensive heart disease with heart failure: Secondary | ICD-10-CM | POA: Diagnosis not present

## 2016-02-13 DIAGNOSIS — E1140 Type 2 diabetes mellitus with diabetic neuropathy, unspecified: Secondary | ICD-10-CM | POA: Insufficient documentation

## 2016-02-13 DIAGNOSIS — I5043 Acute on chronic combined systolic (congestive) and diastolic (congestive) heart failure: Secondary | ICD-10-CM | POA: Diagnosis not present

## 2016-02-13 DIAGNOSIS — G8929 Other chronic pain: Secondary | ICD-10-CM | POA: Diagnosis not present

## 2016-02-13 DIAGNOSIS — R5383 Other fatigue: Secondary | ICD-10-CM | POA: Diagnosis not present

## 2016-02-13 DIAGNOSIS — J9601 Acute respiratory failure with hypoxia: Secondary | ICD-10-CM | POA: Diagnosis not present

## 2016-02-13 DIAGNOSIS — E785 Hyperlipidemia, unspecified: Secondary | ICD-10-CM | POA: Diagnosis not present

## 2016-02-13 DIAGNOSIS — R0902 Hypoxemia: Secondary | ICD-10-CM | POA: Diagnosis not present

## 2016-02-13 DIAGNOSIS — I491 Atrial premature depolarization: Secondary | ICD-10-CM | POA: Diagnosis not present

## 2016-02-13 DIAGNOSIS — J449 Chronic obstructive pulmonary disease, unspecified: Secondary | ICD-10-CM | POA: Diagnosis not present

## 2016-02-13 DIAGNOSIS — I429 Cardiomyopathy, unspecified: Secondary | ICD-10-CM | POA: Diagnosis not present

## 2016-02-13 DIAGNOSIS — G4733 Obstructive sleep apnea (adult) (pediatric): Secondary | ICD-10-CM | POA: Diagnosis not present

## 2016-02-13 DIAGNOSIS — Z9981 Dependence on supplemental oxygen: Secondary | ICD-10-CM | POA: Diagnosis not present

## 2016-02-13 DIAGNOSIS — R778 Other specified abnormalities of plasma proteins: Secondary | ICD-10-CM | POA: Diagnosis not present

## 2016-02-13 DIAGNOSIS — E872 Acidosis: Secondary | ICD-10-CM | POA: Diagnosis not present

## 2016-02-13 DIAGNOSIS — I7389 Other specified peripheral vascular diseases: Secondary | ICD-10-CM | POA: Diagnosis not present

## 2016-02-13 DIAGNOSIS — E860 Dehydration: Secondary | ICD-10-CM | POA: Diagnosis not present

## 2016-02-13 LAB — BASIC METABOLIC PANEL
Anion gap: 9 (ref 5–15)
BUN: 16 mg/dL (ref 6–20)
CO2: 33 mmol/L — ABNORMAL HIGH (ref 22–32)
Calcium: 8.7 mg/dL — ABNORMAL LOW (ref 8.9–10.3)
Chloride: 98 mmol/L — ABNORMAL LOW (ref 101–111)
Creatinine, Ser: 1 mg/dL (ref 0.44–1.00)
GFR calc Af Amer: >60 mL/min (ref >60)
GFR calc non Af Amer: 59 mL/min — ABNORMAL LOW (ref >60)
Glucose, Bld: 159 mg/dL — ABNORMAL HIGH (ref 65–99)
Potassium: 3.4 mmol/L — ABNORMAL LOW (ref 3.5–5.1)
Sodium: 140 mmol/L (ref 135–145)

## 2016-02-13 LAB — CBC
HCT: 37.2 % (ref 36.0–46.0)
Hemoglobin: 11.7 g/dL — ABNORMAL LOW (ref 12.0–15.0)
MCH: 25.3 pg — ABNORMAL LOW (ref 26.0–34.0)
MCHC: 31.5 g/dL (ref 30.0–36.0)
MCV: 80.5 fL (ref 78.0–100.0)
Platelets: 143 10*3/uL — ABNORMAL LOW (ref 150–400)
RBC: 4.62 MIL/uL (ref 3.87–5.11)
RDW: 15.3 % (ref 11.5–15.5)
WBC: 7.3 10*3/uL (ref 4.0–10.5)

## 2016-02-13 LAB — GLUCOSE, CAPILLARY
Glucose-Capillary: 157 mg/dL — ABNORMAL HIGH (ref 65–99)
Glucose-Capillary: 179 mg/dL — ABNORMAL HIGH (ref 65–99)

## 2016-02-13 MED ORDER — SODIUM CHLORIDE 0.9% FLUSH
10.0000 mL | INTRAVENOUS | Status: DC | PRN
  Administered 2016-02-13: 30 mL

## 2016-02-13 MED ORDER — FUROSEMIDE 40 MG PO TABS
40.0000 mg | ORAL_TABLET | Freq: Every day | ORAL | Status: DC
  Administered 2016-02-13: 40 mg via ORAL
  Filled 2016-02-13: qty 1

## 2016-02-13 MED ORDER — INSULIN GLARGINE 100 UNIT/ML ~~LOC~~ SOLN: 30.0000 [IU] | Freq: Every day | SUBCUTANEOUS | Status: DC

## 2016-02-13 MED ORDER — CHLORHEXIDINE GLUCONATE 0.12 % MT SOLN
15.0000 mL | Freq: Two times a day (BID) | OROMUCOSAL | Status: DC
  Administered 2016-02-13: 15 mL via OROMUCOSAL
  Filled 2016-02-13: qty 15

## 2016-02-13 MED ORDER — POTASSIUM CHLORIDE ER 10 MEQ PO TBCR
40.0000 meq | EXTENDED_RELEASE_TABLET | Freq: Every day | ORAL | Status: DC
  Administered 2016-02-13: 40 meq via ORAL
  Filled 2016-02-13 (×2): qty 4

## 2016-02-13 NOTE — Progress Notes (Signed)
Subjective: Doing well this morning. Feels that she is back at her baseline. Denies any dyspnea or CP.  Objective: Vital signs in last 24 hours: Filed Vitals:   02/12/16 1504 02/12/16 2244 02/13/16 0100 02/13/16 0513  BP: 136/68 126/67  115/65  Pulse: 90 90  92  Temp: 99.1 F (37.3 C) 100.3 F (37.9 C)  97.7 F (36.5 C)  TempSrc:  Oral  Oral  Resp: 19 17  18   Height:      Weight:   230 lb 9.6 oz (104.6 kg)   SpO2: 100% 99%  93%   Weight change: -9 lb 0.6 oz (-4.1 kg)  Intake/Output Summary (Last 24 hours) at 02/13/16 1224 Last data filed at 02/13/16 0605  Gross per 24 hour  Intake    480 ml  Output    100 ml  Net    380 ml   General Apperance: NAD HEENT: Normocephalic, atraumatic, anicteric sclera Neck: Supple, trachea midline Lungs: Clear to auscultation bilaterally. No wheezes, rhonchi or rales. Breathing comfortably Heart: Regular rate and rhythm, no murmur/rub/gallop Abdomen: Soft, nontender, nondistended, no rebound/guarding Extremities: Warm and well perfused Skin: No rashes or lesions Neurologic: Alert and interactive. No gross deficits.  Lab Results: Basic Metabolic Panel:  Recent Labs Lab 02/10/16 1609  02/12/16 0438 02/13/16 0640  NA 131*  < > 139 140  K 4.1  < > 3.5 3.4*  CL 92*  < > 99* 98*  CO2 26  < > 30 33*  GLUCOSE 369*  < > 101* 159*  BUN 42*  < > 26* 16  CREATININE 3.94*  < > 1.25* 1.00  CALCIUM 8.2*  < > 8.3* 8.7*  MG 1.8  --   --   --   PHOS 3.7  --   --   --   < > = values in this interval not displayed.  Liver Function Tests:  Recent Labs Lab 02/10/16 1609  AST 15  ALT 18  ALKPHOS 183*  BILITOT 1.2  PROT 7.0  ALBUMIN 3.0*    Recent Labs Lab 02/11/16 0600  AMMONIA 30   CBC:  Recent Labs Lab 02/11/16 0005 02/12/16 0438 02/13/16 0640  WBC 13.1* 7.5 7.3  NEUTROABS 9.9*  --   --   HGB 12.8 11.6* 11.7*  HCT 39.7 37.1 37.2  MCV 79.4 80.7 80.5  PLT 165 142* 143*   Cardiac Enzymes:  Recent Labs Lab  02/10/16 1609 02/11/16 0005 02/11/16 1430  TROPONINI 0.05* 0.17* 0.04*   CBG:  Recent Labs Lab 02/12/16 0827 02/12/16 1148 02/12/16 1735 02/12/16 2229 02/13/16 0759 02/13/16 1159  GLUCAP 87 202* 157* 190* 157* 179*   Hemoglobin A1C:  Recent Labs Lab 02/10/16 1347  HGBA1C 13.7   Thyroid Function Tests:  Recent Labs Lab 02/10/16 1609  TSH 0.603   Anemia Panel:  Recent Labs Lab 02/11/16 1259  VITAMINB12 348   Urine Drug Screen: Drugs of Abuse     Component Value Date/Time   LABOPIA NONE DETECTED 02/05/2016 1307   COCAINSCRNUR NONE DETECTED 02/05/2016 1307   LABBENZ NONE DETECTED 02/05/2016 1307   AMPHETMU NONE DETECTED 02/05/2016 1307   THCU NONE DETECTED 02/05/2016 1307   LABBARB NONE DETECTED 02/05/2016 1307    Urinalysis:  Recent Labs Lab 02/11/16 0332  COLORURINE AMBER*  LABSPEC 1.021  PHURINE 5.5  GLUCOSEU NEGATIVE  HGBUR NEGATIVE  BILIRUBINUR SMALL*  KETONESUR NEGATIVE  PROTEINUR 30*  NITRITE NEGATIVE  LEUKOCYTESUR TRACE*   Micro Results: Recent Results (from  the past 240 hour(s))  MRSA PCR Screening     Status: None   Collection Time: 02/10/16 10:14 PM  Result Value Ref Range Status   MRSA by PCR NEGATIVE NEGATIVE Final    Comment:        The GeneXpert MRSA Assay (FDA approved for NASAL specimens only), is one component of a comprehensive MRSA colonization surveillance program. It is not intended to diagnose MRSA infection nor to guide or monitor treatment for MRSA infections.   Culture, blood (routine x 2)     Status: None (Preliminary result)   Collection Time: 02/11/16  3:04 PM  Result Value Ref Range Status   Specimen Description BLOOD RIGHT HAND  Final   Special Requests IN PEDIATRIC BOTTLE 3CC  Final   Culture NO GROWTH 2 DAYS  Final   Report Status PENDING  Incomplete  Culture, blood (routine x 2)     Status: None (Preliminary result)   Collection Time: 02/11/16  3:08 PM  Result Value Ref Range Status   Specimen  Description BLOOD LEFT HAND  Final   Special Requests IN PEDIATRIC BOTTLE 3CC  Final   Culture NO GROWTH 2 DAYS  Final   Report Status PENDING  Incomplete   Studies/Results: Ct Head Wo Contrast  02/12/2016  CLINICAL DATA:  Altered mental status. Diabetes. Patient not a candidate for MR due to implanted cardiac device. EXAM: CT HEAD WITHOUT CONTRAST TECHNIQUE: Contiguous axial images were obtained from the base of the skull through the vertex without intravenous contrast. COMPARISON:  02/05/2016. FINDINGS: No evidence for acute infarction, hemorrhage, mass lesion, hydrocephalus, or extra-axial fluid. Normal cerebral volume. Hypoattenuation of white matter representing chronic ischemic change. Calvarium intact. Slight layering fluid is suspected in the RIGHT maxillary sinus. Chronic mastoid disease. No orbital findings. Vascular calcification. Similar appearance to priors. IMPRESSION: No acute intracranial findings. Small vessel disease. Stable appearance from priors. Electronically Signed   By: Staci Righter M.D.   On: 02/12/2016 07:38   Medications: I have reviewed the patient's current medications. Scheduled Meds: . aspirin EC  81 mg Oral Daily  . atorvastatin  80 mg Oral QHS  . chlorhexidine  15 mL Mouth/Throat BID  . enoxaparin (LOVENOX) injection  40 mg Subcutaneous Q24H  . furosemide  40 mg Oral Daily  . insulin aspart  0-15 Units Subcutaneous TID WC  . insulin aspart  0-5 Units Subcutaneous QHS  . insulin glargine  10 Units Subcutaneous Daily  . metoprolol succinate  50 mg Oral Daily  . potassium chloride  40 mEq Oral Daily  . sodium chloride flush  3 mL Intravenous Q12H  . tiotropium  18 mcg Inhalation Daily   Continuous Infusions:  PRN Meds:.acetaminophen **OR** acetaminophen, albuterol, sodium chloride flush, technetium TC 86M diethylenetriame-pentaacetic acid Assessment/Plan: 65 year old woman with combined CHF, uncontrolled T2DM, COPD on 2L O2 at night, probable OSA and chronic  back pain from spinal stenosis presenting with acute encephalopathy, hypoxia.  Acute encephalopathy: Mild hypercarbia. VQ scan on her with no evidence for acute pulmonary embolus. Echo bubble study with no shunt. Low grade fevers to 100.6 on 5/11. Mental status greatly improved spontaneously on 5/12. Blood cultures from 5/11 with no growth.T max in last 24 hours was 100.3, and she has no leukocytosis. Repeat CT head  showed no acute abnormality. Acute encephalopathy has resolved. -Outpatient sleep study  Chronic HFrEF: Echo with bubble study showed EF of 20-25% which was severly reduced compared to the prior one from 2015 which showed EF of  40%. -Resume home dose of Lasix. -Replete K -Outpatient nuclear perfusion study and cardiology follow up  Acute renal failure: Baseline cr of 0.9. Resolved.  COPD:  -Spiriva -Albuterol prn  T2DM:  -Lantus -SSI-M  Dispo: Likely home today  The patient does have a current PCP (Alexa Angela Burke, MD) and does need an [facility] hospital follow-up appointment after discharge.  The patient does not know have transportation limitations that hinder transportation to clinic appointments.  .Services Needed at time of discharge: Y = Yes, Blank = No PT:   OT:   RN:   Equipment:   Other:     LOS: 3 days   Peggy Loll, MD 02/13/2016, 12:24 PM

## 2016-02-13 NOTE — Discharge Instructions (Addendum)
You were in the hospital because you were very sleepy and confused. We are glad to see that you are feeling so much better.   You should call to make a follow-up appointment with the Internal Medicine Clinic. It would be good for them to see you in the next 1-2 weeks.  You also need to be seen by the Cardiologist, Dr. Lovena Le. His office will call you to schedule that appointment.   In the meantime, please call the clinic or come to the emergency room if you have increased confusion or sleepiness (or if other people are having trouble keeping you awake more than normal) or worsening of symptoms.  Medication changes: Take 30 units of Lantus daily until you follow up with your primary care clinic  STOP taking these medications: -Gabapentin. Stop taking this medication as it can make you sleepy. When you follow up with your primary care clinic, you may ask about restarting this medication. -Hold off on taking Humalog with meals unless your blood sugars before meals are above 200. Ask your primary care clinic about restarting this medication when you follow up.

## 2016-02-13 NOTE — Evaluation (Signed)
Physical Therapy Evaluation Patient Details Name: [name] MRN: [id number] DOB: 06/23/1951 Today's Date: 02/13/2016   History of Present Illness  pt rpesents with Encephalopathy, AMS, and AKI.  pt with hx of DM and CHF.    Clinical Impression  Pt generally weak and fatigues easily.  Encouraged pt to have family present more often until she has fully recovered.  Feel pt would benefit from HHPT to maximize independence and safety at home.  Noted pt to D/C today, so will sign off and defer further therapy to HHPT.      Follow Up Recommendations Home health PT;Supervision for mobility/OOB    Equipment Recommendations  None recommended by PT    Recommendations for Other Services       Precautions / Restrictions Precautions Precautions: Fall Restrictions Weight Bearing Restrictions: No      Mobility  Bed Mobility               General bed mobility comments: pt sitting EOB.  Transfers Overall transfer level: Modified independent                  Ambulation/Gait Ambulation/Gait assistance: Supervision Ambulation Distance (Feet): 100 Feet (and 50) Assistive device: 4-wheeled walker Gait Pattern/deviations: Step-through pattern;Decreased stride length     General Gait Details: pt fatigues easily and required a sitting rest break on her rollator prior to completing ambulation.  pt does admit that she normally could have walked that distance without a rest break.    Stairs            Wheelchair Mobility    Modified Rankin (Stroke Patients Only)       Balance Overall balance assessment: Needs assistance Sitting-balance support: No upper extremity supported;Feet supported Sitting balance-Leahy Scale: Good     Standing balance support: No upper extremity supported;During functional activity Standing balance-Leahy Scale: Fair                               Pertinent Vitals/Pain Pain Assessment: No/denies pain    Home Living  Family/patient expects to be discharged to:: Private residence Living Arrangements: Alone Available Help at Discharge: Family;Available PRN/intermittently Type of Home: House Home Access: Stairs to enter Entrance Stairs-Rails: Psychiatric nurse of Steps: 4 Home Layout: One level Home Equipment: Walker - 4 wheels;Tub bench;Cane - single point;Walker - standard      Prior Function Level of Independence: Independent with assistive device(s)               Hand Dominance   Dominant Hand: Right    Extremity/Trunk Assessment   Upper Extremity Assessment: Generalized weakness           Lower Extremity Assessment: Generalized weakness      Cervical / Trunk Assessment: Normal  Communication   Communication: No difficulties  Cognition Arousal/Alertness: Awake/alert Behavior During Therapy: WFL for tasks assessed/performed Overall Cognitive Status: No family/caregiver present to determine baseline cognitive functioning                      General Comments      Exercises        Assessment/Plan    PT Assessment All further PT needs can be met in the next venue of care  PT Diagnosis Difficulty walking   PT Problem List Decreased strength;Decreased activity tolerance;Decreased balance;Decreased mobility  PT Treatment Interventions     PT Goals (Current goals can be found in  the Care Plan section) Acute Rehab PT Goals Patient Stated Goal: Home today PT Goal Formulation: All assessment and education complete, DC therapy    Frequency     Barriers to discharge        Co-evaluation               End of Session   Activity Tolerance: Patient limited by fatigue Patient left: in bed;with call bell/phone within reach;with bed alarm set (sitting EOB) Nurse Communication: Mobility status    Functional Assessment Tool Used: Clinical Judgement Functional Limitation: Mobility: Walking and moving around Mobility: Walking and Moving  Around Current Status (T7001): At least 1 percent but less than 20 percent impaired, limited or restricted Mobility: Walking and Moving Around Goal Status [phone]): At least 1 percent but less than 20 percent impaired, limited or restricted Mobility: Walking and Moving Around Discharge Status [phone]): At least 1 percent but less than 20 percent impaired, limited or restricted    Time: 1353-1407 PT Time Calculation (min) (ACUTE ONLY): 14 min   Charges:   PT Evaluation $PT Eval Low Complexity: 1 Procedure     PT G Codes:   PT G-Codes **NOT FOR INPATIENT CLASS** Functional Assessment Tool Used: Clinical Judgement Functional Limitation: Mobility: Walking and moving around Mobility: Walking and Moving Around Current Status ([id number]): At least 1 percent but less than 20 percent impaired, limited or restricted Mobility: Walking and Moving Around Goal Status [phone]): At least 1 percent but less than 20 percent impaired, limited or restricted Mobility: Walking and Moving Around Discharge Status [phone]): At least 1 percent but less than 20 percent impaired, limited or restricted    Catarina Hartshorn, [place] 02/13/2016, 4:22 PM

## 2016-02-13 NOTE — Discharge Summary (Signed)
Name: [name] MRN: [id number] DOB: 1951-08-07 65 y.o. PCP: Florinda Marker, MD  Date of Admission: 02/10/2016  3:59 PM Date of Discharge: 02/13/2016 Attending Physician: Annia Belt, MD  Discharge Diagnosis: 1. HFrEF Active Problems:   Acute encephalopathy   AKI (acute kidney injury) ([place])   Altered mental status   Encounter for central line placement   Hypoxia   Uncontrolled type 1 diabetes with diabetic neuropathy (HCC)   CHF (congestive heart failure), NYHA class II ([place])   Acute on chronic systolic CHF (congestive heart failure) ([name])   Acute respiratory failure with hypercapnia ([place])  Discharge Medications:   Medication List    STOP taking these medications        gabapentin 300 MG capsule  Commonly known as:  NEURONTIN     insulin lispro 100 UNIT/ML KiwkPen  Commonly known as:  HUMALOG KWIKPEN      TAKE these medications        albuterol (2.5 MG/3ML) 0.083% nebulizer solution  Commonly known as:  PROVENTIL  USE 1 VIAL VIA NEBULIZER EVERY 6 HOURS AS NEEDED FOR WHEEZING OR SHORTNESS OF BREATH     albuterol 108 (90 Base) MCG/ACT inhaler  Commonly known as:  PROVENTIL HFA;VENTOLIN HFA  Inhale 2 puffs into the lungs every 6 (six) hours as needed for wheezing or shortness of breath.     amLODipine-olmesartan 10-40 MG tablet  Commonly known as:  AZOR  Take 1 tablet by mouth daily.     aspirin EC 81 MG tablet  Take 1 tablet (81 mg total) by mouth daily.     atorvastatin 80 MG tablet  Commonly known as:  LIPITOR  Take 1 tablet (80 mg total) by mouth at bedtime.     furosemide 40 MG tablet  Commonly known as:  LASIX  Take 1 tablet (40 mg total) by mouth daily. Place Rx on file (filled Rx on 11/11/15)     glucose blood test strip  Commonly known as:  ONE TOUCH ULTRA TEST  Use as instructed by doctor up to 3x daily,dx code 250.02 insulin requiring     insulin glargine 100 UNIT/ML injection  Commonly known as:  LANTUS  Inject 0.3 mLs (30 Units  total) into the skin daily. Inject 30 units total into the skin daily until you follow up with your primary care clinic. Then may consider increasing back to 60 units total.     Insulin Syringe-Needle U-100 31G X 5/16" 1 ML Misc  Use to give Lantus Insulin daily. E11.22.     metFORMIN 500 MG 24 hr tablet  Commonly known as:  GLUCOPHAGE-XR  Take 4 tablets (2,000 mg total) by mouth daily with breakfast.     metoprolol succinate 50 MG 24 hr tablet  Commonly known as:  TOPROL-XL  Take 1 tablet (50 mg total) by mouth daily.     nitroGLYCERIN 0.4 MG SL tablet  Commonly known as:  NITROSTAT  Place 0.4 mg under the tongue every 5 (five) minutes as needed for chest pain.     omeprazole 20 MG capsule  Commonly known as:  PRILOSEC  Take 1 capsule (20 mg total) by mouth 2 (two) times daily.     ondansetron 4 MG disintegrating tablet  Commonly known as:  ZOFRAN ODT  Take 1 tablet (4 mg total) by mouth every 8 (eight) hours as needed for nausea.     tiotropium 18 MCG inhalation capsule  Commonly known as:  [place] 1  capsule (18 mcg total) into inhaler and inhale daily.        Disposition and follow-up:   [name] was discharged from [facility] in Stable condition.  At the hospital follow up visit please address:  1.  HFrEF: EF of 20-25%, reduced from 45 % in 2015. Cardiology increased metoprolol, and resumed diuretics. Please assess her compliance with lasix. Will need follow up with cardiology. Will need outpatient nuclear perfusion study and follow up.   Acute encephalopathy: Pt has a lot of desats when she is sleeping which is due to her OSA +/- OHS. She needs outpatient sleep study/ split night with CPAP titration . This was contributing to her lethargy.  Also, gabapentin was STOPPED on discharge.   COPD: spiriva and albuterol    2.  Labs / imaging needed at time of follow-up:   3.  Pending labs/ test needing follow-up:   Follow-up Appointments:       Follow-up Information    Follow up with Cristopher Peru, MD.   Specialty:  Cardiology   Why:  The office will contact you   Contact information:   [address] [place] [address] [phone]       Follow up with Florinda Marker, MD. Schedule an appointment as soon as possible for a visit in 2 weeks.   Specialty:  Internal Medicine   Why:  Hospital follow-up   Contact information:   [address] [place] [address] [phone]       Discharge Instructions: Discharge Instructions    ([place]) Call MD:  Anytime you have any of the following symptoms: 1) 3 pound weight gain in 24 hours or 5 pounds in 1 week 2) shortness of breath, with or without a dry hacking cough 3) swelling in the hands, feet or stomach 4) if you have to sleep on extra pillows at night in order to breathe.    Complete by:  As directed      Call MD for:  difficulty breathing, headache or visual disturbances    Complete by:  As directed      Call MD for:  extreme fatigue    Complete by:  As directed      Call MD for:  persistant dizziness or light-headedness    Complete by:  As directed      Call MD for:  persistant nausea and vomiting    Complete by:  As directed      Call MD for:  severe uncontrolled pain    Complete by:  As directed      Diet - low sodium heart healthy    Complete by:  As directed      Increase activity slowly    Complete by:  As directed            Consultations: Treatment Team:  Rounding Lbcardiology, MD  Procedures Performed:  X-ray Chest Pa And Lateral  02/10/2016  CLINICAL DATA:  Hypoxia and weakness EXAM: CHEST  2 VIEW COMPARISON:  01/03/2016 FINDINGS: LEFT-sided pacemaker with a single lead overlies enlarged cardiac silhouette. There is increased central venous congestion compared to prior. Small pleural effusions noted. IMPRESSION: Cardiomegaly with increased central venous congestion and pleural effusions consistent with congestive  heart failure. Electronically Signed   By: Suzy Bouchard M.D.   On: 02/10/2016 18:12   Ct Head Wo Contrast  02/12/2016  CLINICAL DATA:  Altered mental status. Diabetes. Patient not a candidate  for MR due to implanted cardiac device. EXAM: CT HEAD WITHOUT CONTRAST TECHNIQUE: Contiguous axial images were obtained from the base of the skull through the vertex without intravenous contrast. COMPARISON:  02/05/2016. FINDINGS: No evidence for acute infarction, hemorrhage, mass lesion, hydrocephalus, or extra-axial fluid. Normal cerebral volume. Hypoattenuation of white matter representing chronic ischemic change. Calvarium intact. Slight layering fluid is suspected in the RIGHT maxillary sinus. Chronic mastoid disease. No orbital findings. Vascular calcification. Similar appearance to priors. IMPRESSION: No acute intracranial findings. Small vessel disease. Stable appearance from priors. Electronically Signed   By: Staci Righter M.D.   On: 02/12/2016 07:38   Ct Head Wo Contrast  02/05/2016  CLINICAL DATA:  Slurred speech and left-sided weakness EXAM: CT HEAD WITHOUT CONTRAST TECHNIQUE: Contiguous axial images were obtained from the base of the skull through the vertex without intravenous contrast. COMPARISON:  None. FINDINGS: The ventricles are normal in size and configuration. There is no intracranial mass, hemorrhage, extra-axial fluid collection, or midline shift. There is patchy small vessel disease in the centra semiovale bilaterally. Elsewhere gray-white compartments appear normal. No acute infarct is evident. The middle cerebral artery attenuation is symmetric bilaterally. Bony calvarium appears intact. Mastoid air cells are clear. No intraorbital lesions are evident. IMPRESSION: Patchy periventricular small vessel disease bilaterally. No acute infarct evident. No hemorrhage or mass effect. Critical Value/emergent results were called by telephone at the time of interpretation on 02/05/2016 at 12:09 pm to Dr.  Cordie Grice, neurology, who verbally acknowledged these results. Electronically Signed   By: Lowella Grip III M.D.   On: 02/05/2016 12:10   Ct Abdomen Pelvis W Contrast  02/05/2016  CLINICAL DATA:  Patient with right-sided abdominal pain for 3 days. Nausea and vomiting. EXAM: CT ABDOMEN AND PELVIS WITH CONTRAST TECHNIQUE: Multidetector CT imaging of the abdomen and pelvis was performed using the standard protocol following bolus administration of intravenous contrast. CONTRAST:  1 ISOVUE-300 IOPAMIDOL (ISOVUE-300) INJECTION 61% COMPARISON:  CT abdomen pelvis 02/26/2015. FINDINGS: Lower chest: Cardiomegaly. No pericardial effusion. Re- demonstrated mosaic ground-glass attenuation within the bilateral lower lobes. Stable 3 mm left lower lobe nodule. Subpleural atelectasis left lower lobe. More focal subpleural consolidation left lower lobe (image 94; series 5). Hepatobiliary: Liver is normal in size and contour. No focal hepatic lesion is identified. Small amount of sludge within the gallbladder lumen. No gallbladder wall thickening. Pancreas: Unremarkable Spleen: Unremarkable Adrenals/Urinary Tract: The adrenal glands are normal. Small amount of fat stranding/ fluid about the left adrenal gland, nonspecific. The kidneys enhance symmetrically with contrast. No hydronephrosis. The urinary bladder is unremarkable. Stomach/Bowel: No abnormal bowel wall thickening or evidence for bowel obstruction. No free fluid or free intraperitoneal air. The appendix is normal. Small hiatal hernia. Vascular/Lymphatic: Normal caliber abdominal aorta. No retroperitoneal lymphadenopathy. Other: Status post hysterectomy. Musculoskeletal: Lumbar spine degenerative changes. No aggressive or acute appearing osseous lesions. IMPRESSION: No acute process within the abdomen or pelvis. Unchanged mosaic attenuation pattern at the lung bases. More focal left lower lobe subpleural consolidation may represent focal atelectasis or infection.  Recommend short-term followup chest radiograph to ensure resolution. Electronically Signed   By: Lovey Newcomer M.D.   On: 02/05/2016 16:02   US Renal  02/11/2016  CLINICAL DATA:  Acute kidney injury EXAM: RENAL / URINARY TRACT ULTRASOUND COMPLETE COMPARISON:  None. FINDINGS: Right Kidney: Length: 12.6 cm. Echogenicity within normal limits. No mass or hydronephrosis visualized. Left Kidney: Length: 11.4 cm. Echogenicity within normal limits. No mass or hydronephrosis visualized. Bladder: Appears normal for degree of  bladder distention. IMPRESSION: Both kidneys appear normal.  No hydronephrosis. Electronically Signed   By: Andreas Newport M.D.   On: 02/11/2016 01:30   Nm Pulmonary Perf And Vent  02/11/2016  CLINICAL DATA:  Hypoxia EXAM: NUCLEAR MEDICINE VENTILATION - PERFUSION LUNG SCAN TECHNIQUE: Ventilation images were obtained in multiple projections using inhaled aerosol Tc-50m DTPA. Perfusion images were obtained in multiple projections after intravenous injection of Tc-55m MAA. RADIOPHARMACEUTICALS:  31.7 mCi Technetium-49m DTPA aerosol inhalation and 4.19 mCi Technetium-69m MAA IV COMPARISON:  Chest x-ray from the previous day FINDINGS: Ventilation: There is adequate uptake on the ventilation images. Large cardiac shadow is noted. Some mild decreased ventilation is noted in the right middle lobe. Perfusion: Perfusion images demonstrate adequate uptake throughout both lungs with the exception of a area of decreased perfusion in the right middle lobe. This matches the ventilation images and likely related to focal atelectasis. IMPRESSION: No ventilation-perfusion mismatch to suggest pulmonary embolism is noted. There is a matched defect in the right middle lobe likely related to atelectasis. Electronically Signed   By: Inez Catalina M.D.   On: 02/11/2016 07:11   Dg Chest Port 1 View  02/11/2016  CLINICAL DATA:  Altered mental status, history of COPD, diabetes, cardiomyopathy, current smoker. EXAM:  PORTABLE CHEST 1 VIEW COMPARISON:  Portable chest x-ray of Feb 10, 2016 FINDINGS: The lungs remain well-expanded. The interstitial markings remain mildly increased. The cardiac silhouette remains enlarged and the pulmonary vascularity mildly engorged. The permanent pacemaker defibrillator is in stable position. The left internal jugular venous catheter tip projects over the proximal SVC. IMPRESSION: CHF with mild interstitial edema superimposed upon COPD. No evidence of aspiration. There has not been significant interval change since the previous study. Electronically Signed   By: David  Martinique M.D.   On: 02/11/2016 08:04   Dg Chest Port 1 View  02/10/2016  CLINICAL DATA:  65 year old female with central line placement EXAM: PORTABLE CHEST 1 VIEW COMPARISON:  Earlier Chest radiograph dated 02/10/2016 FINDINGS: There has been interval placement of a left IJ central line with tip over the upper mediastinum likely at the junction of the left innominate and SVC. There is a small left pleural effusion. Left lung base atelectasis. The right lung is clear. No pneumothorax. There is cardiomegaly. Left pectoral AICD device. No acute osseous pathology. IMPRESSION: Left IJ central line with tip at the junction of the innominate and SVC. No pneumothorax. Electronically Signed   By: Anner Crete M.D.   On: 02/10/2016 23:44   US Abdomen Limited Ruq  02/05/2016  CLINICAL DATA:  Vomiting and abdominal pain. EXAM: US ABDOMEN LIMITED - RIGHT UPPER QUADRANT COMPARISON:  CT 02/05/2016 FINDINGS: Gallbladder: No gallstones or wall thickening visualized. No sonographic Murphy sign noted by sonographer. Common bile duct: Diameter: 6.0 mm, normal. Liver: There is mildly coarsened echotexture of hepatic parenchyma without focal lesion. No intrahepatic bile duct dilatation. IMPRESSION: Normal gallbladder and bile ducts. Mildly coarsened hepatic echotexture without focal lesion. Electronically Signed   By: Andreas Newport M.D.   On:  02/05/2016 19:14    2D Echo: EF 20-25%, normal bubble study  Cardiac Cath:   Admission HPI:   Ms Greenan, a 65 yo woman with combined systolic and diastolic HF with last echo in 2015 EF 40-45% with an AICD in place followed by Dr [facility], HTN, uncontrolled IDDM with last A1c 13, COPD on 2L o2 at home at night, possible OSA, chronic back pain from spinal stenosis, who had come in  to the clinic with follow up for T2DM.   IT was decided to admit her because int he clinic, pt was very somnolent, lethargic and confused and kept falling asleep in the exam and daughter was present in the room.  She says that patient was her normal self past Thu, but on Friday, she was confused, somnolent and lethargic which was not usual for her. She took her to the ER, where they did a code stroke, but neurologic exam was entirely unremarkable. A CBC, BMET, UA, UDS, Ct abd/pelvis, and abd [place] were normal. She was discharged with zofran and suggested that she should stop taking gabapentin on discharge. She takes about 300 mg of gabapentin at night, and the dose has notbeen changed. Last refill was in February. On Saturday, and Sunday, she continued to be like that, and Sunday daughter took her to [place], where work up was normal including a TVUS, pt found to be hypoxic in the 70s. Daughter says that she noticed a big difference between Monday and Tuesday and yesterday pt was not talking and kept sleeping.   Daughter says that patient was eating normally int he last 2 days. She denies any weakness or numbness. Pt denies fevers or chills. She denies bladder or bowel incontinence or any seizure like activity. Pt has never been diagnosed with OSA and does not use CPAP. She says that she has noticed a change in patient's gait and gait was very imbalanced.  Daughter says pt has not consumed any alcohol in the last week or used any illicit drugs. Pt smoked about 1 ppd with 40 pack year smoking history but has not  smoked in last 2 days.   Hospital Course by problem list: Active Problems:   Acute encephalopathy   AKI (acute kidney injury) ([place])   Altered mental status   Encounter for central line placement   Hypoxia   Uncontrolled type 1 diabetes with diabetic neuropathy (HCC)   CHF (congestive heart failure), NYHA class II (HCC)   Acute on chronic systolic CHF (congestive heart failure) ([place])   Acute respiratory failure with hypercapnia ([place])   1. Acute encephalopathy: Patient presented with severe hypoxia with only mild hypercarbia. On her repeat ABG, the CO2 increased when oxygen was administered. We did a VQ scan on her which showed no evidence for acute pulmonary embolus or left to right shunt. The next day, patient is running low grade fevers to 100.6 However, she denies any nuchal rigidity. A brain MRi is unable to be ontained as I called radiology and she has a st jude AICD in place.  We were going to do an LP, however, pt's mental status remarkably improved the next day, so LP was not performed. An echo with bubble study showed no evidence of ASD, VSD, and PFO. A follow up CT scan showed no acute changes suggestive of stroke. Given that she remained afebrile, with normal white count and greatly improved mental status, she was observed overnight before discharging oN Saturday with follow up in the clinic. Blood cultures continue to show NGTD.    Acute HFrEF: Echo with bubble study showed EF of 20-25% which was severly reduced compared to the prior one from 2015 which showed EF of 40%. She has signs of heart failure with crackles on exam and 2+ pitting edema. However, her BNP is 25 which is very surprising. She has an AICD in place. Cardiology felt that the low BNP was likely related to acute renal insufficiency and obesity. They  increased the beta blocker dose and diuretics were resumed on discharge.    Acute renal failure: Cr on admission was markedly elevated, which went down after receiving  fluid. FeNa was 0.3. She also received IV contrast dye 5 days ago. A renal ultrasound was unremarkable. Baseline cr of 0.9  Her renal function rapidly improved with the initiation of IV fluids. PO lasix was resumed on discharge.   Elevated troponin levels: We trended troponins and they peaked at 0.17before downtrending to 0.04. Cardiology recommended no further inpatient workup.  COPD: PFTs in Sept 2016 showing reduced FEV1 and increased FEV1 to FVC ratio. Continued spiriva and albuterol   T2DM: CBGs well controlled  HTN: pressures are well controlled    Discharge Vitals:   BP 115/65 mmHg  Pulse 92  Temp(Src) 97.7 F (36.5 C) (Oral)  Resp 18  Ht 5' (1.524 m)  Wt 230 lb 9.6 oz (104.6 kg)  BMI 45.04 kg/m2  SpO2 93%  Discharge Labs:  Results for orders placed or performed during the hospital encounter of 02/10/16 (from the past 24 hour(s))  Glucose, capillary     Status: Abnormal   Collection Time: 02/12/16  5:35 PM  Result Value Ref Range   Glucose-Capillary 157 (H) 65 - 99 mg/dL  Glucose, capillary     Status: Abnormal   Collection Time: 02/12/16 10:29 PM  Result Value Ref Range   Glucose-Capillary 190 (H) 65 - 99 mg/dL  CBC     Status: Abnormal   Collection Time: 02/13/16  6:40 AM  Result Value Ref Range   WBC 7.3 4.0 - 10.5 K/uL   RBC 4.62 3.87 - 5.11 MIL/uL   Hemoglobin 11.7 (L) 12.0 - 15.0 g/dL   HCT 37.2 36.0 - 46.0 %   MCV 80.5 78.0 - 100.0 fL   MCH 25.3 (L) 26.0 - 34.0 pg   MCHC 31.5 30.0 - 36.0 g/dL   RDW 15.3 11.5 - 15.5 %   Platelets 143 (L) 150 - 400 K/uL  Basic metabolic panel     Status: Abnormal   Collection Time: 02/13/16  6:40 AM  Result Value Ref Range   Sodium 140 135 - 145 mmol/L   Potassium 3.4 (L) 3.5 - 5.1 mmol/L   Chloride 98 (L) 101 - 111 mmol/L   CO2 33 (H) 22 - 32 mmol/L   Glucose, Bld 159 (H) 65 - 99 mg/dL   BUN 16 6 - 20 mg/dL   Creatinine, Ser 1.00 0.44 - 1.00 mg/dL   Calcium 8.7 (L) 8.9 - 10.3 mg/dL   GFR calc non Af Amer 59 (L) >60  mL/min   GFR calc Af Amer >60 >60 mL/min   Anion gap 9 5 - 15  Glucose, capillary     Status: Abnormal   Collection Time: 02/13/16  7:59 AM  Result Value Ref Range   Glucose-Capillary 157 (H) 65 - 99 mg/dL  Glucose, capillary     Status: Abnormal   Collection Time: 02/13/16 11:59 AM  Result Value Ref Range   Glucose-Capillary 179 (H) 65 - 99 mg/dL    Signed: Milagros Loll, MD 02/13/2016, 12:51 PM    Services Ordered on Discharge:  Equipment Ordered on Discharge:

## 2016-02-13 NOTE — Progress Notes (Signed)
Patient Name: [name] Date of Encounter: 02/13/2016  Active Problems:   Acute encephalopathy   AKI (acute kidney injury) ([place])   Altered mental status   Encounter for central line placement   Hypoxia   Uncontrolled type 1 diabetes with diabetic neuropathy (HCC)   CHF (congestive heart failure), NYHA class II ([place])   Acute on chronic systolic CHF (congestive heart failure) ([place])   Acute respiratory failure with hypercapnia ([place])   Length of Stay: 3  SUBJECTIVE  Looks and feels well. Lying fully supine without dyspnea. Creatinine has normalized. Reports she expects to be discharged today.  CURRENT MEDS . aspirin EC  81 mg Oral Daily  . atorvastatin  80 mg Oral QHS  . enoxaparin (LOVENOX) injection  40 mg Subcutaneous Q24H  . furosemide  40 mg Oral Daily  . insulin aspart  0-15 Units Subcutaneous TID WC  . insulin aspart  0-5 Units Subcutaneous QHS  . insulin glargine  10 Units Subcutaneous Daily  . metoprolol succinate  50 mg Oral Daily  . sodium chloride flush  3 mL Intravenous Q12H  . tiotropium  18 mcg Inhalation Daily    OBJECTIVE   Intake/Output Summary (Last 24 hours) at 02/13/16 0935 Last data filed at 02/13/16 [id number]  Gross per 24 hour  Intake    480 ml  Output    200 ml  Net    280 ml   Filed Weights   02/11/16 0500 02/12/16 0437 02/13/16 0100  Weight: 106.8 kg (235 lb 7.2 oz) 108.7 kg (239 lb 10.2 oz) 104.6 kg (230 lb 9.6 oz)    PHYSICAL EXAM Filed Vitals:   02/12/16 1504 02/12/16 2244 02/13/16 0100 02/13/16 0513  BP: 136/68 126/67  115/65  Pulse: 90 90  92  Temp: 99.1 F (37.3 C) 100.3 F (37.9 C)  97.7 F (36.5 C)  TempSrc:  Oral  Oral  Resp: 19 17  18   Height:      Weight:   104.6 kg (230 lb 9.6 oz)   SpO2: 100% 99%  93%   General: Alert, oriented x3, no distress Head: no evidence of trauma, PERRL, EOMI, no exophtalmos or lid lag, no myxedema, no xanthelasma; normal ears, nose and oropharynx Neck: normal jugular venous pulsations and  no hepatojugular reflux; brisk carotid pulses without delay and no carotid bruits Chest: clear to auscultation, no signs of consolidation by percussion or palpation, normal fremitus, symmetrical and full respiratory excursions Cardiovascular: normal position and quality of the apical impulse, regular rhythm, normal first and second heart sounds, no rubs or gallops, no murmur Abdomen: no tenderness or distention, no masses by palpation, no abnormal pulsatility or arterial bruits, normal bowel sounds, no hepatosplenomegaly Extremities: no clubbing, cyanosis or edema; 2+ radial, ulnar and brachial pulses bilaterally; 2+ right femoral, posterior tibial and dorsalis pedis pulses; 2+ left femoral, posterior tibial and dorsalis pedis pulses; no subclavian or femoral bruits Neurological: grossly nonfocal  LABS  CBC  Recent Labs  02/11/16 0005 02/12/16 0438 02/13/16 0640  WBC 13.1* 7.5 7.3  NEUTROABS 9.9*  --   --   HGB 12.8 11.6* 11.7*  HCT 39.7 37.1 37.2  MCV 79.4 80.7 80.5  PLT 165 142* PENDING   Basic Metabolic Panel  Recent Labs  02/10/16 1609  02/12/16 0438 02/13/16 0640  NA 131*  < > 139 140  K 4.1  < > 3.5 3.4*  CL 92*  < > 99* 98*  CO2 26  < > 30 33*  GLUCOSE 369*  < > 101* 159*  BUN 42*  < > 26* 16  CREATININE 3.94*  < > 1.25* 1.00  CALCIUM 8.2*  < > 8.3* 8.7*  MG 1.8  --   --   --   PHOS 3.7  --   --   --   < > = values in this interval not displayed. Liver Function Tests  Recent Labs  02/10/16 1609  AST 15  ALT 18  ALKPHOS 183*  BILITOT 1.2  PROT 7.0  ALBUMIN 3.0*   No results for input(s): LIPASE, AMYLASE in the last 72 hours. Cardiac Enzymes  Recent Labs  02/10/16 1609 02/11/16 0005 02/11/16 1430  TROPONINI 0.05* 0.17* 0.04*   BNP Invalid input(s): POCBNP D-Dimer No results for input(s): DDIMER in the last 72 hours. Hemoglobin A1C  Recent Labs  02/10/16 1347  HGBA1C 13.7   Fasting Lipid Panel No results for input(s): CHOL, HDL, LDLCALC,  TRIG, CHOLHDL, LDLDIRECT in the last 72 hours. Thyroid Function Tests  Recent Labs  02/10/16 1609  TSH 0.603    Radiology Studies Imaging results have been reviewed and Ct Head Wo Contrast  02/12/2016  CLINICAL DATA:  Altered mental status. Diabetes. Patient not a candidate for MR due to implanted cardiac device. EXAM: CT HEAD WITHOUT CONTRAST TECHNIQUE: Contiguous axial images were obtained from the base of the skull through the vertex without intravenous contrast. COMPARISON:  02/05/2016. FINDINGS: No evidence for acute infarction, hemorrhage, mass lesion, hydrocephalus, or extra-axial fluid. Normal cerebral volume. Hypoattenuation of white matter representing chronic ischemic change. Calvarium intact. Slight layering fluid is suspected in the RIGHT maxillary sinus. Chronic mastoid disease. No orbital findings. Vascular calcification. Similar appearance to priors. IMPRESSION: No acute intracranial findings. Small vessel disease. Stable appearance from priors. Electronically Signed   By: Staci Righter M.D.   On: 02/12/2016 07:38    TELE NSR    ASSESSMENT AND PLAN  She appears euvolemic and does not have any current CV complaints. Resume home dose of diuretic, supplement K. Plan to continue ARB and beta blockers as prior to admission. There has been a substantial drop in LVEF. While this may be due to progression of the underlying cardiomyopathy, need to consider superimposed CAD in this patient with poorly controlled insulin requiring DM. Will arrange outpatient nuclear perfusion study and follow up.   Sanda Klein, MD, [facility] [phone] office [phone] pager 02/13/2016 9:35 AM

## 2016-02-13 NOTE — Progress Notes (Signed)
Patient ID: [name], female   DOB: 07/12/1951, 65 y.o.   MRN: [id number] Medicine attending discharge note: I personally examined this patient on the day of discharge and I attest to the accuracy of the discharge evaluation and plan as recorded in the daily progress note dated 02/13/2016 by resident physician Dr. Jacques Earthly. An additional discharge note will be dictated by resident physician Dr. Burgess Estelle.   Clinical summary: 65 year old woman with hypertension, poorly controlled diabetes, obstructive airway disease, on home oxygen which she uses at night, questionable obesity hypoventilation syndrome not on CPAP.  She denies any prior history of asthma emphysema or COPD despite chart notes to the contrary with respect to COPD. She has chronic systolic and diastolic heart failure with most recent ejection fraction recorded at 40-45 percent 2 years ago. She has an implantable defibrillator. Symptoms began on Friday, 5 days prior to admission, initially with leg and back pain, then nausea and vomiting, then progressive lethargy. Family noted intermittent confusion. Transient loss of hearing. She was initially seen in the emergency department on May 5. Complaints of flareup of chronic back pain at that time. She had some slurred speech and reported left-sided weakness. However no focal neurologic deficits detected by neurology consultant at that time and a CT scan of the brain showed no acute abnormalities. Because she had some vomiting, a CT scan of the abdomen and pelvis with contrast and was also ordered which showed no acute process in the abdomen or pelvis. Family took her to [facility] on Sunday. Evaluation unremarkable except for hypoxia with oxygen saturations in the mid 80s and she was discharged.  She presented for a short interim clinic follow-up visit on May 10. She was found to be somnolent, lethargic, and confused. She was hypertensive, tachycardic, and hypoxic. Findings were  suspicious for CO2 narcosis. Patient admitted for further evaluation.  On initial exam, Blood pressure 151/110, pulse 62 regular, temperature 98.3, oxygen saturations 78% Tachycardia without S3 gallop or murmur. 1+ ankle edema. Short fat neck card to evaluate for JVD. Inspiratory crackles lower lung fields, scattered expiratory wheezes, she appeared to obstruct her airway while asleep. She was lethargic and somnolent but arousable and oriented with no focal neurologic deficit.  Hospital course: Arterial blood gas showed severe hypoxia with moderate hypercarbia. Initial PO2 42 mm with PCO2 49 and pH 7.35 This is recorded as a arterial blood gas but I suspect it may have been venous. Repeat blood gas on 3 L oxygen with pH 7.31 PaO2 69, PCO2 60. Serum bicarbonate 26. Anion gap 13. Degree of hypoxia disproportionate to the degree of hypercarbia. Our concern was possible pulmonary embolus or a left-to-right cardiac shunt. She was dehydrated with a significant acute change in her renal function compared with recent baseline, BUN 42, creatinine 3.9, compared with BUN 8, creatinine 0.7 done just 5 days prior to admission. In view of this, we obtained a ventilation perfusion lung scan which showed no areas of ventilation/perfusion mismatch, low probability pulmonary embolus. We obtained an echocardiogram with a bubble study. No evidence for a ASD, VSD, or PFO. There was however a significant decrease in her ejection fraction down from 40-45 percent 2 years ago to current 20-25 percent. In view of initial lethargy, confusion, low-grade fevers, leukocytosis, and history of possible intermittent neurologic deficits, we initially planned to obtain a lumbar puncture. However on the third hospital day, without addition of antibiotics, she became afebrile, white count normalized, and her mental status dramatically improved back  to normal. There was no longer any need for a lumbar puncture. A follow-up CT scan of the brain  showed no changes to suggest an evolving stroke. Best fit diagnosis explain her initial complaints is a combination of severe hypoxia, hypercarbia, and an acute viral illness with possible viral meningitis.  With respect to her acute renal insufficiency, this appeared to be multifactorial: Decrease oral intake, continued use of diuretics, vomiting, osmotic diuresis with initial blood sugar 369, and IV contrast given 5 days prior for a CT scan of the abdomen. She was given gentle hydration. Her renal function rapidly improved and on day of discharge May 13 BUN 16, creatinine 1.0.  She was seen in consultation by cardiology. Initial troponin 0.05, repeat value of 0.17, then 0.04. BNP was 25. This was discrepant with physical and x-ray findings with suggested a component of acute on chronic systolic heart failure. Cardiologist felt that the disproportionately low BNP was likely related to the acute renal insufficiency and obesity. Diuretics were withheld. They were resumed on day of discharge when renal function normalized. Beta blocker dose was increased. Cardiologist felt that any further evaluation of her progressive cardiomyopathy could be done as an outpatient.  Disposition: Condition stable at time of discharge Resume regular activity and diet. Follow up in the internal medicine clinic with Dr. Burna Forts Follow-up with cardiology There were no complications

## 2016-02-15 NOTE — Care Management Note (Signed)
Case Management Note  Patient Details  Name: EUGENIA RARDON MRN: [id number] Date of Birth: 01/24/1951  Subjective/Objective:    pt rpesented with Encephalopathy, AMS, and AKI. pt with hx of DM and CHF.                 Action/Plan:  CM spoke with patient regarding discharge and transitional care. Patient reports she lives alone but has family support. Discussed recommendations for HHPT for reconditioning, patient is agreeable with transitional plan. Offered choice of Agency, [place] selected. Verified patient's contact information,referral faxed in to [facility] [phone].fax confirmation received. Patient agreeable with discharge plan. Patient verbalized understanding teach back done.  Patient denies any additonal questions or concerns at this time. Provided patient with CM contact information should any additional questions should arise. No further CM needs identified.   Expected Discharge Date:    02/14/2016              Expected Discharge Plan:  [place]  In-House Referral:  NA  Discharge planning Services  CM Consult  Post Acute Care Choice:    Choice offered to:  Patient  DME Arranged:  N/A DME Agency:  NA  HH Arranged:  PT [place] Agency:  [place]  Status of Service:  Completed, signed off  Medicare Important Message Given:    Date Medicare IM Given:    Medicare IM give by:    Date Additional Medicare IM Given:    Additional Medicare Important Message give by:     If discussed at [place] of Stay Meetings, dates discussed:    Additional CommentsLaurena Slimmer, RN 02/15/2016, 6:17 PM

## 2016-02-16 LAB — CULTURE, BLOOD (ROUTINE X 2)
Culture: NO GROWTH
Culture: NO GROWTH

## 2016-02-17 ENCOUNTER — Other Ambulatory Visit: Payer: Self-pay | Admitting: *Deleted

## 2016-02-17 ENCOUNTER — Telehealth: Payer: Self-pay | Admitting: *Deleted

## 2016-02-17 DIAGNOSIS — E1165 Type 2 diabetes mellitus with hyperglycemia: Secondary | ICD-10-CM | POA: Diagnosis not present

## 2016-02-17 DIAGNOSIS — J449 Chronic obstructive pulmonary disease, unspecified: Secondary | ICD-10-CM | POA: Diagnosis not present

## 2016-02-17 DIAGNOSIS — M4806 Spinal stenosis, lumbar region: Secondary | ICD-10-CM | POA: Diagnosis not present

## 2016-02-17 DIAGNOSIS — E1140 Type 2 diabetes mellitus with diabetic neuropathy, unspecified: Secondary | ICD-10-CM | POA: Diagnosis not present

## 2016-02-17 DIAGNOSIS — I110 Hypertensive heart disease with heart failure: Secondary | ICD-10-CM | POA: Diagnosis not present

## 2016-02-17 DIAGNOSIS — Z9981 Dependence on supplemental oxygen: Secondary | ICD-10-CM | POA: Diagnosis not present

## 2016-02-17 DIAGNOSIS — I5042 Chronic combined systolic (congestive) and diastolic (congestive) heart failure: Secondary | ICD-10-CM | POA: Diagnosis not present

## 2016-02-17 NOTE — Telephone Encounter (Signed)
[name] PT calls and states at visit this morning it is noted pt needs some additional services: 02 is not working, PT had pt called the 02 provider and they will come today and repair 02 After ambulating appr 27ft pt's 02 sat is 78% on room air, after pt rested for appr 63mins 02 sat was 95% but at end of ph call pt's 02 sat at rest was 73%- pt does not appear to be in any distress and denies distress Pt was confused about her meds, what to take and what to stop, we went over each one PT request verbal order for home csw eval and HHN eval and treat plan- verbal approval was given, do you approve? appt for HFU was offered this pm w/ pcp, pt refused due to transportation, hosp f/u appt was made for 5/24 with you

## 2016-02-17 NOTE — Telephone Encounter (Signed)
Already started note

## 2016-02-17 NOTE — Telephone Encounter (Signed)
Yes, I approve of order for home csw eval and HHN eval and treat. Thank you for assisting her with her medications.  Martyn Malay, DO PGY-2 Internal Medicine Resident Pager # [phone] 02/17/2016 11:55 AM

## 2016-02-19 DIAGNOSIS — E1140 Type 2 diabetes mellitus with diabetic neuropathy, unspecified: Secondary | ICD-10-CM | POA: Diagnosis not present

## 2016-02-19 DIAGNOSIS — J449 Chronic obstructive pulmonary disease, unspecified: Secondary | ICD-10-CM | POA: Diagnosis not present

## 2016-02-19 DIAGNOSIS — I110 Hypertensive heart disease with heart failure: Secondary | ICD-10-CM | POA: Diagnosis not present

## 2016-02-19 DIAGNOSIS — Z9981 Dependence on supplemental oxygen: Secondary | ICD-10-CM | POA: Diagnosis not present

## 2016-02-19 DIAGNOSIS — M4806 Spinal stenosis, lumbar region: Secondary | ICD-10-CM | POA: Diagnosis not present

## 2016-02-19 DIAGNOSIS — E1165 Type 2 diabetes mellitus with hyperglycemia: Secondary | ICD-10-CM | POA: Diagnosis not present

## 2016-02-19 DIAGNOSIS — I5042 Chronic combined systolic (congestive) and diastolic (congestive) heart failure: Secondary | ICD-10-CM | POA: Diagnosis not present

## 2016-02-22 DIAGNOSIS — E1140 Type 2 diabetes mellitus with diabetic neuropathy, unspecified: Secondary | ICD-10-CM | POA: Diagnosis not present

## 2016-02-22 DIAGNOSIS — E1165 Type 2 diabetes mellitus with hyperglycemia: Secondary | ICD-10-CM | POA: Diagnosis not present

## 2016-02-22 DIAGNOSIS — M4806 Spinal stenosis, lumbar region: Secondary | ICD-10-CM | POA: Diagnosis not present

## 2016-02-22 DIAGNOSIS — I5042 Chronic combined systolic (congestive) and diastolic (congestive) heart failure: Secondary | ICD-10-CM | POA: Diagnosis not present

## 2016-02-22 DIAGNOSIS — J449 Chronic obstructive pulmonary disease, unspecified: Secondary | ICD-10-CM | POA: Diagnosis not present

## 2016-02-22 DIAGNOSIS — I110 Hypertensive heart disease with heart failure: Secondary | ICD-10-CM | POA: Diagnosis not present

## 2016-02-22 DIAGNOSIS — Z9981 Dependence on supplemental oxygen: Secondary | ICD-10-CM | POA: Diagnosis not present

## 2016-02-23 ENCOUNTER — Other Ambulatory Visit: Payer: Self-pay | Admitting: Internal Medicine

## 2016-02-23 DIAGNOSIS — M4806 Spinal stenosis, lumbar region: Secondary | ICD-10-CM | POA: Diagnosis not present

## 2016-02-23 DIAGNOSIS — I110 Hypertensive heart disease with heart failure: Secondary | ICD-10-CM | POA: Diagnosis not present

## 2016-02-23 DIAGNOSIS — Z9981 Dependence on supplemental oxygen: Secondary | ICD-10-CM | POA: Diagnosis not present

## 2016-02-23 DIAGNOSIS — J449 Chronic obstructive pulmonary disease, unspecified: Secondary | ICD-10-CM | POA: Diagnosis not present

## 2016-02-23 DIAGNOSIS — I5042 Chronic combined systolic (congestive) and diastolic (congestive) heart failure: Secondary | ICD-10-CM | POA: Diagnosis not present

## 2016-02-23 DIAGNOSIS — E1140 Type 2 diabetes mellitus with diabetic neuropathy, unspecified: Secondary | ICD-10-CM | POA: Diagnosis not present

## 2016-02-23 DIAGNOSIS — E1165 Type 2 diabetes mellitus with hyperglycemia: Secondary | ICD-10-CM | POA: Diagnosis not present

## 2016-02-23 NOTE — Telephone Encounter (Signed)
Regarding Medications.

## 2016-02-23 NOTE — Telephone Encounter (Signed)
I agree. Thank you.

## 2016-02-23 NOTE — Telephone Encounter (Signed)
HHN calls and ask for clarification on 3 meds, amlodipine/olmasaten, gabapentin, flexeril- informed aml/olmas is to continue, gabapentin was stopped at hosp disch and flexeril has not been prescribed since 07/2015 She states pt wants gabapentin, told her to have pt speak w/ dr burns at [facility] tomorrow She is agreeable

## 2016-02-24 ENCOUNTER — Ambulatory Visit (INDEPENDENT_AMBULATORY_CARE_PROVIDER_SITE_OTHER): Payer: Medicare Other | Admitting: Internal Medicine

## 2016-02-24 ENCOUNTER — Encounter: Payer: Medicare Other | Admitting: *Deleted

## 2016-02-24 ENCOUNTER — Telehealth: Payer: Self-pay | Admitting: Cardiology

## 2016-02-24 ENCOUNTER — Encounter: Payer: Self-pay | Admitting: Internal Medicine

## 2016-02-24 VITALS — BP 130/72 | HR 94 | Temp 97.7°F | Ht 60.0 in | Wt 231.6 lb

## 2016-02-24 DIAGNOSIS — E1165 Type 2 diabetes mellitus with hyperglycemia: Secondary | ICD-10-CM | POA: Diagnosis not present

## 2016-02-24 DIAGNOSIS — M48061 Spinal stenosis, lumbar region without neurogenic claudication: Secondary | ICD-10-CM

## 2016-02-24 DIAGNOSIS — I5042 Chronic combined systolic (congestive) and diastolic (congestive) heart failure: Secondary | ICD-10-CM

## 2016-02-24 DIAGNOSIS — Z794 Long term (current) use of insulin: Secondary | ICD-10-CM

## 2016-02-24 DIAGNOSIS — N182 Chronic kidney disease, stage 2 (mild): Secondary | ICD-10-CM | POA: Diagnosis not present

## 2016-02-24 DIAGNOSIS — E1122 Type 2 diabetes mellitus with diabetic chronic kidney disease: Secondary | ICD-10-CM

## 2016-02-24 DIAGNOSIS — G4733 Obstructive sleep apnea (adult) (pediatric): Secondary | ICD-10-CM

## 2016-02-24 LAB — GLUCOSE, CAPILLARY: Glucose-Capillary: 250 mg/dL — ABNORMAL HIGH (ref 65–99)

## 2016-02-24 MED ORDER — INSULIN LISPRO 100 UNIT/ML (KWIKPEN): 5.0000 [IU] | PEN_INJECTOR | Freq: Three times a day (TID) | SUBCUTANEOUS | Status: DC

## 2016-02-24 MED ORDER — DICLOFENAC SODIUM 1 % TD GEL: 2.0000 g | Freq: Four times a day (QID) | TRANSDERMAL | Status: DC

## 2016-02-24 NOTE — Telephone Encounter (Signed)
Attempted to confirm remote transmission with pt. No answer and was unable to leave a message.   

## 2016-02-24 NOTE — Progress Notes (Signed)
Subjective:    Patient ID: [name], female    DOB: 1951/08/21, 65 y.o.   MRN: [id number]  HPI [name] is a 65 y.o. female with PMHx of CHF NYHA II, HTN, NICM, COPD, HLD, T2DM, Depression who presents to the clinic for hospital follow up for HFrEF. Please see A&P for the status of the patient's chronic medical problems.   Past Medical History  Diagnosis Date  . Hyperthyroidism, subclinical   . Post menopausal syndrome   . Obesity   . Cardiac defibrillator in situ     Atlas II VR (SJM) implanted by Dr Lovena Le  . Eczema   . Lipoma   . Chronic ulcer of leg ([place])     04-09-15 resolved-not a problem.  . Hyperlipidemia   . Chronic combined systolic and diastolic heart failure (HCC)     a. EF 35-40% in past;  b. Echo 7/13:  EF 45-50%, Gr 2 diast dysfn, mild AI, mild MAC, trivial MR, mild LAE, PASP 47.  [name] NICM (nonischemic cardiomyopathy) ([place])   . Diabetes mellitus   . HTN (hypertension)   . Elevated alkaline phosphatase level     GGT and 5'nucleotidase 8/13 normal  . Hx of cardiac cath     a. [place] 2003 normal;  b. LHC 6/13:  Mild calcification in the LM, o/w normal coronary arteries, EF 45%.   . Sleep apnea     pt denies 04/12/2013  . COPD (chronic obstructive pulmonary disease) (HCC)     O2 at night  . Automatic implantable cardioverter-defibrillator in situ   . CHF (congestive heart failure) ([place])   . Depression   . Implantable cardioverter-defibrillator (ICD) generator end of life   . Colon polyps 04/12/2013    Rectosigmoid polyp  . History of oxygen administration     oxygen @ 2 l/m nasally bedtime 24/7  . ATN (acute tubular necrosis) ([place]) 07/15/2014    Outpatient Encounter Prescriptions as of 02/24/2016  Medication Sig  . albuterol (PROVENTIL HFA;VENTOLIN HFA) 108 (90 Base) MCG/ACT inhaler Inhale 2 puffs into the lungs every 6 (six) hours as needed for wheezing or shortness of breath.  [name] albuterol (PROVENTIL) (2.5 MG/3ML) 0.083% nebulizer solution USE 1 VIAL VIA  NEBULIZER EVERY 6 HOURS AS NEEDED FOR WHEEZING OR SHORTNESS OF BREATH  . amLODipine-olmesartan (AZOR) 10-40 MG tablet Take 1 tablet by mouth daily.  [name] aspirin EC 81 MG tablet Take 1 tablet (81 mg total) by mouth daily.  [name] atorvastatin (LIPITOR) 80 MG tablet Take 1 tablet (80 mg total) by mouth at bedtime.  . diclofenac sodium (VOLTAREN) 1 % GEL Apply 2 g topically 4 (four) times daily.  . furosemide (LASIX) 40 MG tablet Take 1 tablet (40 mg total) by mouth daily. Place Rx on file (filled Rx on 11/11/15)  . glucose blood (ONE TOUCH ULTRA TEST) test strip Use as instructed by doctor up to 3x daily,dx code 250.02 insulin requiring  . insulin glargine (LANTUS) 100 UNIT/ML injection Inject 0.3 mLs (30 Units total) into the skin daily. Inject 30 units total into the skin daily until you follow up with your primary care clinic. Then may consider increasing back to 60 units total.  . insulin lispro (HUMALOG KWIKPEN) 100 UNIT/ML KiwkPen Inject 0.05 mLs (5 Units total) into the skin 3 (three) times daily.  . Insulin Syringe-Needle U-100 31G X 5/16" 1 ML MISC Use to give Lantus Insulin daily. E11.22.  . metFORMIN (GLUCOPHAGE-XR) 500 MG 24 hr tablet Take 4  tablets (2,000 mg total) by mouth daily with breakfast.  . metoprolol succinate (TOPROL-XL) 50 MG 24 hr tablet Take 1 tablet (50 mg total) by mouth daily.  . nitroGLYCERIN (NITROSTAT) 0.4 MG SL tablet Place 0.4 mg under the tongue every 5 (five) minutes as needed for chest pain.  [name] omeprazole (PRILOSEC) 20 MG capsule Take 1 capsule (20 mg total) by mouth 2 (two) times daily.  . ondansetron (ZOFRAN ODT) 4 MG disintegrating tablet Take 1 tablet (4 mg total) by mouth every 8 (eight) hours as needed for nausea.  [name] tiotropium (SPIRIVA) 18 MCG inhalation capsule Place 1 capsule (18 mcg total) into inhaler and inhale daily.   No facility-administered encounter medications on file as of 02/24/2016.    Family History  Problem Relation Age of Onset  . Stroke Mother     . Seizures Father   . Diabetes Sister   . Asthma Maternal Aunt     aunts  . Asthma Maternal Uncle     uncles  . Heart disease Father   . Heart disease Paternal Aunt     aunts  . Heart disease Paternal Uncle     uncles  . Heart disease Maternal Aunt     aunts  . Heart disease Maternal Uncle     uncles  . Heart disease Maternal Grandfather   . Colon cancer Neg Hx   . Colon polyps Neg Hx   . Esophageal cancer Neg Hx   . Kidney disease Neg Hx   . Gallbladder disease Neg Hx     Social History   Social History  . Marital Status: Widowed    Spouse Name: Alroy Dust  . Number of Children: 3  . Years of Education: 11   Occupational History  . disabled    Social History Main Topics  . Smoking status: Former Smoker -- 0.20 packs/day for 35 years    Types: Cigarettes  . Smokeless tobacco: Never Used     Comment: Quit since d/c from hospital.  . Alcohol Use: No  . Drug Use: No  . Sexual Activity: Not on file   Other Topics Concern  . Not on file   Social History Narrative   ** Merged History Encounter **       Married   Review of Systems General: Admits to fatigue and decreased appetite. Denies fever, chills, and diaphoresis.  Respiratory: Admits to DOE and SOB. Denies chest tightness, and wheezing.   Cardiovascular: Denies chest pain and palpitations.  Gastrointestinal: Denies nausea, vomiting, abdominal pain, diarrhea, constipation Endocrine: Denies polyuria, and polydipsia. Musculoskeletal: Admits to chronic back pain. Denies myalgias, joint swelling.  Neurological: Admits to weakness. Denies dizziness, headaches, lightheadedness    Objective:   Physical Exam Filed Vitals:   02/24/16 1516  BP: 130/72  Pulse: 94  Temp: 97.7 F (36.5 C)  TempSrc: Oral  Height: 5' (1.524 m)  Weight: 231 lb 9.6 oz (105.053 kg)  SpO2: 98%   General: Vital signs reviewed.  Patient is obese, in no acute distress and cooperative with exam.  Cardiovascular: RRR, S1 normal, S2  normal, no murmurs, gallops, or rubs. Pulmonary/Chest: Mild inspiratory crackles in lower lung fields bilaterally, no wheezes, or rhonchi. Abdominal: Soft, non-tender, non-distended, BS + Extremities: No lower extremity edema bilaterally Skin: Warm, dry and intact.  Psychiatric: Normal mood and affect. speech and behavior is normal. Cognition and memory are grossly normal.     Assessment & Plan:   Please see problem based assessment and plan.

## 2016-02-24 NOTE — Patient Instructions (Signed)
TAKE LANTUS 30 UNITS IN THE MORNING.  TAKE METFORMIN 2000 MG DAILY  TAKE HUMALOG 5 UNITS THREE TIMES A DAY BEFORE MEALS.  STOP TAKING GABAPENTIN.  START TAKING VOLTAREN GEL.  KEEP TAKING AMLODIPINE-OLMESARTAN ONCE A DAY.  I HAVE ORDERED A SLEEP STUDY AND STRESS TEST FOR YOU.

## 2016-02-25 ENCOUNTER — Other Ambulatory Visit: Payer: Self-pay

## 2016-02-25 DIAGNOSIS — M4806 Spinal stenosis, lumbar region: Secondary | ICD-10-CM | POA: Diagnosis not present

## 2016-02-25 DIAGNOSIS — I110 Hypertensive heart disease with heart failure: Secondary | ICD-10-CM | POA: Diagnosis not present

## 2016-02-25 DIAGNOSIS — M48061 Spinal stenosis, lumbar region without neurogenic claudication: Secondary | ICD-10-CM

## 2016-02-25 DIAGNOSIS — E1140 Type 2 diabetes mellitus with diabetic neuropathy, unspecified: Secondary | ICD-10-CM | POA: Diagnosis not present

## 2016-02-25 DIAGNOSIS — J449 Chronic obstructive pulmonary disease, unspecified: Secondary | ICD-10-CM | POA: Diagnosis not present

## 2016-02-25 DIAGNOSIS — Z9981 Dependence on supplemental oxygen: Secondary | ICD-10-CM | POA: Diagnosis not present

## 2016-02-25 DIAGNOSIS — E1165 Type 2 diabetes mellitus with hyperglycemia: Secondary | ICD-10-CM | POA: Diagnosis not present

## 2016-02-25 DIAGNOSIS — I5042 Chronic combined systolic (congestive) and diastolic (congestive) heart failure: Secondary | ICD-10-CM | POA: Diagnosis not present

## 2016-02-25 NOTE — Telephone Encounter (Signed)
Pt wants this sent to [facility] not [facility], they will not transfer a script

## 2016-02-25 NOTE — Assessment & Plan Note (Signed)
Lab Results  Component Value Date   HGBA1C 13.7 02/10/2016   HGBA1C 12.8 11/11/2015   HGBA1C 11.9* 06/18/2015     Assessment: Diabetes control:  Uncontrolled Progress toward A1C goal:   Deteriorated Comments: Patient was previously on Lantus 60 units, Humalog 10 mg TID WC, and Metformin 1000 mg BID prior to admission. While inpatient, patient required a smaller dose of insulin; however, patient admits she was not eating like she does at home. She was also encephalopathic for the first couple days. She admits to overall decreased appetite than prior so her insulin requirements may be less. Her fasting CBGs are in the 150s, but her sparsely check CBGs in the afternoon range from 250 to 400.  Plan: Medications:  Continue Lantus 40 unit QD. Restart Humalog 5 units TID WC. Follow up in two weeks for CBG checks and adjustment of medications. Patient instructed to check her blood glucose at varying times throughout the afternoon and before bedtime for more accurate data on diabetes control

## 2016-02-25 NOTE — Assessment & Plan Note (Addendum)
While admitted, TTE revealed worsened EF of 20-25%. Patient has an AICD in place. Due to mildly elevated troponin in the absence of chest pain or EKG changes, patient likely experienced demand ischemia. Cardiology recommended continuing Metoprolol 50 mg QD and Lasix 40 mg QD and pursuing outpatient myoview. Patient has mild inspiratory crackles on exam today, but overall appears euvolemic.   Plan: -Continue metoprolol 50 mg daily -Continue Lasix 40 mg daily -Continue amlodipine-olmesartan -Outpatient myoview -Follow up with Cardiology -Return in 2 weeks

## 2016-02-25 NOTE — Assessment & Plan Note (Signed)
Patient recently admitted with concern for CO2 narcosis. She was hypoxic and acutely encephalopathic. Work up for hypoxia with V/Q scan was normal- no PE or shunt. Echo with bubble study was normal- no ASD, VSD, or PFO. It was recommended due to hypoxia and obstruction of airway during sleep, that patient undergo a split night sleep study with CPAP titration for OSA/OHS. Gabapentin was also discontinued given encephalopathy.  Plan: -Split night sleep study

## 2016-02-25 NOTE — Telephone Encounter (Signed)
Pt requesting voltaren gel to be filled @ [facility] on [address].

## 2016-02-25 NOTE — Progress Notes (Signed)
Case discussed with Dr. Burns soon after the resident saw the patient. We reviewed the resident's history and exam and pertinent patient test results. I agree with the assessment, diagnosis, and plan of care documented in the resident's note. 

## 2016-02-26 ENCOUNTER — Encounter: Payer: Self-pay | Admitting: Cardiology

## 2016-02-26 DIAGNOSIS — J449 Chronic obstructive pulmonary disease, unspecified: Secondary | ICD-10-CM | POA: Diagnosis not present

## 2016-02-26 DIAGNOSIS — Z9981 Dependence on supplemental oxygen: Secondary | ICD-10-CM | POA: Diagnosis not present

## 2016-02-26 DIAGNOSIS — E1165 Type 2 diabetes mellitus with hyperglycemia: Secondary | ICD-10-CM | POA: Diagnosis not present

## 2016-02-26 DIAGNOSIS — I5042 Chronic combined systolic (congestive) and diastolic (congestive) heart failure: Secondary | ICD-10-CM | POA: Diagnosis not present

## 2016-02-26 DIAGNOSIS — M4806 Spinal stenosis, lumbar region: Secondary | ICD-10-CM | POA: Diagnosis not present

## 2016-02-26 DIAGNOSIS — E1140 Type 2 diabetes mellitus with diabetic neuropathy, unspecified: Secondary | ICD-10-CM | POA: Diagnosis not present

## 2016-02-26 DIAGNOSIS — I110 Hypertensive heart disease with heart failure: Secondary | ICD-10-CM | POA: Diagnosis not present

## 2016-02-26 MED ORDER — DICLOFENAC SODIUM 1 % TD GEL: 2.0000 g | Freq: Four times a day (QID) | TRANSDERMAL | Status: DC

## 2016-02-26 NOTE — Telephone Encounter (Signed)
Called to notify patient, no answer.

## 2016-02-27 DIAGNOSIS — J449 Chronic obstructive pulmonary disease, unspecified: Secondary | ICD-10-CM | POA: Diagnosis not present

## 2016-03-01 ENCOUNTER — Telehealth: Payer: Self-pay

## 2016-03-01 DIAGNOSIS — E1165 Type 2 diabetes mellitus with hyperglycemia: Secondary | ICD-10-CM | POA: Diagnosis not present

## 2016-03-01 DIAGNOSIS — E1140 Type 2 diabetes mellitus with diabetic neuropathy, unspecified: Secondary | ICD-10-CM | POA: Diagnosis not present

## 2016-03-01 DIAGNOSIS — I5042 Chronic combined systolic (congestive) and diastolic (congestive) heart failure: Secondary | ICD-10-CM | POA: Diagnosis not present

## 2016-03-01 DIAGNOSIS — Z9981 Dependence on supplemental oxygen: Secondary | ICD-10-CM | POA: Diagnosis not present

## 2016-03-01 DIAGNOSIS — J449 Chronic obstructive pulmonary disease, unspecified: Secondary | ICD-10-CM | POA: Diagnosis not present

## 2016-03-01 DIAGNOSIS — I110 Hypertensive heart disease with heart failure: Secondary | ICD-10-CM | POA: Diagnosis not present

## 2016-03-01 DIAGNOSIS — M4806 Spinal stenosis, lumbar region: Secondary | ICD-10-CM | POA: Diagnosis not present

## 2016-03-01 NOTE — Telephone Encounter (Signed)
Attempted to call pt back, message states mailbox full

## 2016-03-01 NOTE — Telephone Encounter (Signed)
Pt requesting the nurse to call back regarding voltaren gel.

## 2016-03-02 ENCOUNTER — Telehealth: Payer: Self-pay | Admitting: Internal Medicine

## 2016-03-02 DIAGNOSIS — J449 Chronic obstructive pulmonary disease, unspecified: Secondary | ICD-10-CM | POA: Diagnosis not present

## 2016-03-02 DIAGNOSIS — E1140 Type 2 diabetes mellitus with diabetic neuropathy, unspecified: Secondary | ICD-10-CM | POA: Diagnosis not present

## 2016-03-02 DIAGNOSIS — M4806 Spinal stenosis, lumbar region: Secondary | ICD-10-CM | POA: Diagnosis not present

## 2016-03-02 DIAGNOSIS — E1165 Type 2 diabetes mellitus with hyperglycemia: Secondary | ICD-10-CM | POA: Diagnosis not present

## 2016-03-02 DIAGNOSIS — I5042 Chronic combined systolic (congestive) and diastolic (congestive) heart failure: Secondary | ICD-10-CM | POA: Diagnosis not present

## 2016-03-02 DIAGNOSIS — Z9981 Dependence on supplemental oxygen: Secondary | ICD-10-CM | POA: Diagnosis not present

## 2016-03-02 DIAGNOSIS — I110 Hypertensive heart disease with heart failure: Secondary | ICD-10-CM | POA: Diagnosis not present

## 2016-03-02 NOTE — Telephone Encounter (Signed)
GEL FOR PAIN DOC WANTS HER TO USE COST TOO MUCH, IS THERE SOMETHING ELSE SHE COULD USE, CALL FROM [employer] W/ADVANCED

## 2016-03-03 DIAGNOSIS — M4806 Spinal stenosis, lumbar region: Secondary | ICD-10-CM | POA: Diagnosis not present

## 2016-03-03 DIAGNOSIS — I110 Hypertensive heart disease with heart failure: Secondary | ICD-10-CM | POA: Diagnosis not present

## 2016-03-03 DIAGNOSIS — I5042 Chronic combined systolic (congestive) and diastolic (congestive) heart failure: Secondary | ICD-10-CM | POA: Diagnosis not present

## 2016-03-03 DIAGNOSIS — Z9981 Dependence on supplemental oxygen: Secondary | ICD-10-CM | POA: Diagnosis not present

## 2016-03-03 DIAGNOSIS — E1140 Type 2 diabetes mellitus with diabetic neuropathy, unspecified: Secondary | ICD-10-CM | POA: Diagnosis not present

## 2016-03-03 DIAGNOSIS — J449 Chronic obstructive pulmonary disease, unspecified: Secondary | ICD-10-CM | POA: Diagnosis not present

## 2016-03-03 DIAGNOSIS — E1165 Type 2 diabetes mellitus with hyperglycemia: Secondary | ICD-10-CM | POA: Diagnosis not present

## 2016-03-03 MED ORDER — LIDOCAINE 5 % EX OINT: 1.0000 "application " | TOPICAL_OINTMENT | CUTANEOUS | Status: DC | PRN

## 2016-03-03 NOTE — Telephone Encounter (Signed)
Called pt, lm for rtc 

## 2016-03-03 NOTE — Telephone Encounter (Signed)
I'm surprised voltaren gel is expensive for her as I thought the generic was now covered by Medicare. We can try topical lidocaine which I will send in for her.

## 2016-03-04 ENCOUNTER — Telehealth: Payer: Self-pay | Admitting: Internal Medicine

## 2016-03-04 NOTE — Telephone Encounter (Signed)
New Message    4. Are you calling to see if we received your device transmission? YES. received a letter stating that the transmission was not received.

## 2016-03-04 NOTE — Telephone Encounter (Signed)
Returned call- monitor not connected since 09/17/15. Attempted to troubleshoot monitor with Ms. Dest- unsuccessful. I have referred her to [facility] for further support. She is agreeable to call.

## 2016-03-04 NOTE — Telephone Encounter (Signed)
Pt calling about a device transmission issue.

## 2016-03-09 ENCOUNTER — Ambulatory Visit (INDEPENDENT_AMBULATORY_CARE_PROVIDER_SITE_OTHER): Payer: Medicare Other | Admitting: Internal Medicine

## 2016-03-09 ENCOUNTER — Encounter: Payer: Self-pay | Admitting: Internal Medicine

## 2016-03-09 VITALS — BP 143/87 | HR 103 | Temp 98.7°F | Wt 233.0 lb

## 2016-03-09 DIAGNOSIS — E1122 Type 2 diabetes mellitus with diabetic chronic kidney disease: Secondary | ICD-10-CM

## 2016-03-09 DIAGNOSIS — E1165 Type 2 diabetes mellitus with hyperglycemia: Secondary | ICD-10-CM | POA: Diagnosis not present

## 2016-03-09 DIAGNOSIS — N182 Chronic kidney disease, stage 2 (mild): Secondary | ICD-10-CM | POA: Diagnosis not present

## 2016-03-09 DIAGNOSIS — I5042 Chronic combined systolic (congestive) and diastolic (congestive) heart failure: Secondary | ICD-10-CM

## 2016-03-09 DIAGNOSIS — G4733 Obstructive sleep apnea (adult) (pediatric): Secondary | ICD-10-CM

## 2016-03-09 DIAGNOSIS — Z794 Long term (current) use of insulin: Secondary | ICD-10-CM

## 2016-03-09 MED ORDER — INSULIN GLARGINE 100 UNIT/ML ~~LOC~~ SOLN: 35.0000 [IU] | Freq: Every day | SUBCUTANEOUS | Status: DC

## 2016-03-09 NOTE — Patient Instructions (Signed)
[name],  Keep taking all of your medications as you have been previously, except we will are going to increase the Lantus to 35 units from 30 units. Keep taking the 5 units with meals and remember to take them with each meal. You can cut the dose in half if it's just over 100 and don't take it if it's under 100, like we talked about today. We do want to see you in the next 2-4 weeks to see how your sugars are doing since we are changing your insulin regimen. Otherwise, I'm happy you're feeling better. Call us if you don't hear to make an appointment with the night study center in the next week. Also, make sure to follow up with your cardiologist.  Thanks, Blane Ohara

## 2016-03-10 ENCOUNTER — Telehealth: Payer: Self-pay | Admitting: *Deleted

## 2016-03-10 DIAGNOSIS — I5042 Chronic combined systolic (congestive) and diastolic (congestive) heart failure: Secondary | ICD-10-CM | POA: Diagnosis not present

## 2016-03-10 DIAGNOSIS — J449 Chronic obstructive pulmonary disease, unspecified: Secondary | ICD-10-CM | POA: Diagnosis not present

## 2016-03-10 DIAGNOSIS — Z9981 Dependence on supplemental oxygen: Secondary | ICD-10-CM | POA: Diagnosis not present

## 2016-03-10 DIAGNOSIS — E1165 Type 2 diabetes mellitus with hyperglycemia: Secondary | ICD-10-CM | POA: Diagnosis not present

## 2016-03-10 DIAGNOSIS — E1140 Type 2 diabetes mellitus with diabetic neuropathy, unspecified: Secondary | ICD-10-CM | POA: Diagnosis not present

## 2016-03-10 DIAGNOSIS — M4806 Spinal stenosis, lumbar region: Secondary | ICD-10-CM | POA: Diagnosis not present

## 2016-03-10 DIAGNOSIS — I110 Hypertensive heart disease with heart failure: Secondary | ICD-10-CM | POA: Diagnosis not present

## 2016-03-10 NOTE — Telephone Encounter (Signed)
Called pt to relay dr vincent's message, pt states she does not have a CPAP, states she was suppose to get a call for a sleep study and has never heard from anyone Also ask her to call clinic back if she became more short of breath with activity or lying flat, she was agreeable

## 2016-03-10 NOTE — Telephone Encounter (Signed)
Today for the first time at [facility] visit it is noted that pt has bil wheezing in lower lobes, expiratory. C/o of slight shortness of breath, nebs help with the shortness of breath but not with the wheezing. Vs stable, 02 mid 90"s Please advise HHN ask for some continued visits due to these new wheezes 1 visit/week for 2 weeks, verbal approval given

## 2016-03-10 NOTE — Telephone Encounter (Signed)
Thank you for letting us know. I think it is ok to watch to see how [name] does at home. Wheezing on exam may or may not be relevant. If she has increasing shortness of breath with activity, or with positioning like lying flat, please ask her to come to [facility] for an acute visit. Also please encourage her to wear CPAP as prescribed every night while sleeping.

## 2016-03-10 NOTE — Assessment & Plan Note (Signed)
Lab Results  Component Value Date   HGBA1C 13.7 02/10/2016   HGBA1C 12.8 11/11/2015   HGBA1C 11.9* 06/18/2015     Assessment: Diabetes control:  not at goal  Progress toward A1C goal:   not at goal Comments: While patient was previously on Lantus 60 units and Humalog 10 3 times a day WC as well as metformin, her doses have been halved due to dietary changes and recent hospitalization. As with last visit, her CBGs are scattered, with the lowest being 50 and the highest being in the 400s. The 50 was asymptomatic after a meal. However, the majority of her morning CBGs are in the high 100s. While it was noted that she was taking Lantus 40 units daily per Dr. Jenness Corner last note, she says she is only taking 30 units daily. She is taking Humalog 5 units 3 times a day WC, and and nose not to take it if her CBG is under 100. I did instruct her to take half of the dose, 2-3 units if her sugars are just over 100, which she says has happened a couple times recently.  Plan: Medications:  Since her sugars reflect 30 units of Lantus, will titrate up to 35 units every morning and continue Humalog 5 units 3 times a day WC. Instructed to check her lead glucoses at varying times throughout the afternoon as well as before bedtime so we can get a better idea of how to adjust her medications. Patient is in agreement with this plan

## 2016-03-10 NOTE — Assessment & Plan Note (Signed)
Today, patient has no complaints to suggest worsening heart failure symptoms. Her fatigue and dyspnea are at her baseline. She is at her dry weight and appears euvolemic on exam. She has not yet made a follow-up appointment with her cardiologist, who did have plans to pursue an outpatient Myoview at her discharge last month. -Continue metoprolol, Lasix, and amlodipine olmesartan. -Reiterated the patient needs to make follow-up with cardiology. She agrees and says she will do this tomorrow -RTC 2-4 weeks

## 2016-03-10 NOTE — Progress Notes (Signed)
   Patient ID: LAYALI OKABE female   DOB: 02/10/1951 65 y.o.   MRN: [id number]  Subjective:   HPI: Ms.Marjie AMILAH KOPELMAN is a 65 y.o. with PMH of IDDM, HFrEF 2/2 NICM s/p AICD (EF 20-25% 5/17), COPD, OSA who presents to [facility] today for follow-up of her HFrEF and IDDM. Today, she feels well and has no acute complaints.  Please see problem-based charting for status of medical issues pertinent to this visit.  Review of Systems: Pertinent items noted in HPI and remainder of comprehensive ROS otherwise negative.  Objective:  Physical Exam: Filed Vitals:   03/09/16 1018  BP: 143/87  Pulse: 103  Temp: 98.7 F (37.1 C)  TempSrc: Oral  Weight: 233 lb (105.688 kg)  SpO2: 89%   Gen: Well-appearing, Morbidly obese alert and oriented to person, place, and time HEENT: Oropharynx clear without erythema or exudate.  Neck: No cervical LAD, no thyromegaly or nodules, no JVD noted. CV: Normal rate, regular rhythm, no murmurs, rubs, or gallops Pulmonary: Normal effort, faint expiratory wheezes heard bilaterally, no crackles heard bilaterally Abdominal: Soft, non-tender, non-distended, without rebound, guarding, or masses Extremities: Distal pulses 2+ in upper and lower extremities bilaterally, no tenderness, erythema or edema Skin: No atypical appearing moles. No rashes  Assessment & Plan:  Please see problem-based charting for assessment and plan.  Blane Ohara, MD Resident Physician, PGY-1 Department of Internal Medicine [facility]

## 2016-03-10 NOTE — Assessment & Plan Note (Signed)
While a split night sleep study was ordered at last visit, [name] says she has not heard from them to make an appointment. However, I spoke with [place] who said the sleep center has contacted her at least twice leaving messages. We will make sure she gets to schedule as this is important given her at least some component of her encephalopathy at last admission was thought due to CO2 narcosis and hypoxia -Follow-up on scheduling split night sleep study

## 2016-03-11 NOTE — Progress Notes (Signed)
Internal Medicine Clinic Attending  Case discussed with Dr. Kennedy at the time of the visit.  We reviewed the resident's history and exam and pertinent patient test results.  I agree with the assessment, diagnosis, and plan of care documented in the resident's note.  

## 2016-03-15 DIAGNOSIS — E119 Type 2 diabetes mellitus without complications: Secondary | ICD-10-CM | POA: Diagnosis not present

## 2016-03-17 ENCOUNTER — Telehealth: Payer: Self-pay | Admitting: Internal Medicine

## 2016-03-17 DIAGNOSIS — M4806 Spinal stenosis, lumbar region: Secondary | ICD-10-CM | POA: Diagnosis not present

## 2016-03-17 DIAGNOSIS — Z9981 Dependence on supplemental oxygen: Secondary | ICD-10-CM | POA: Diagnosis not present

## 2016-03-17 DIAGNOSIS — I5042 Chronic combined systolic (congestive) and diastolic (congestive) heart failure: Secondary | ICD-10-CM | POA: Diagnosis not present

## 2016-03-17 DIAGNOSIS — E1165 Type 2 diabetes mellitus with hyperglycemia: Secondary | ICD-10-CM | POA: Diagnosis not present

## 2016-03-17 DIAGNOSIS — J449 Chronic obstructive pulmonary disease, unspecified: Secondary | ICD-10-CM | POA: Diagnosis not present

## 2016-03-17 DIAGNOSIS — I110 Hypertensive heart disease with heart failure: Secondary | ICD-10-CM | POA: Diagnosis not present

## 2016-03-17 DIAGNOSIS — E1140 Type 2 diabetes mellitus with diabetic neuropathy, unspecified: Secondary | ICD-10-CM | POA: Diagnosis not present

## 2016-03-17 NOTE — Telephone Encounter (Signed)
[facility] to Discuss patient Care.  Please advise whether appt is needed.

## 2016-03-17 NOTE — Telephone Encounter (Signed)
Called HHN back she states pt is not "looking good", nebs did help but pt is telling her its worse, called pt offered ED, refused, appt made for fri am pt agreeable

## 2016-03-17 NOTE — Telephone Encounter (Signed)
Thank you Bonnita Nasuti.. I would advise ED visit as well but if she refuses please have her scheduled for an early appointment tomorrow. If she gets worse please tell her to go to the ED or call EMS.

## 2016-03-18 ENCOUNTER — Ambulatory Visit (INDEPENDENT_AMBULATORY_CARE_PROVIDER_SITE_OTHER): Payer: Medicare Other | Admitting: Internal Medicine

## 2016-03-18 ENCOUNTER — Other Ambulatory Visit: Payer: Self-pay | Admitting: Internal Medicine

## 2016-03-18 ENCOUNTER — Encounter: Payer: Self-pay | Admitting: Internal Medicine

## 2016-03-18 VITALS — BP 135/60 | HR 86 | Temp 97.8°F | Wt 232.4 lb

## 2016-03-18 DIAGNOSIS — I5042 Chronic combined systolic (congestive) and diastolic (congestive) heart failure: Secondary | ICD-10-CM | POA: Insufficient documentation

## 2016-03-18 DIAGNOSIS — I5043 Acute on chronic combined systolic (congestive) and diastolic (congestive) heart failure: Secondary | ICD-10-CM | POA: Diagnosis not present

## 2016-03-18 DIAGNOSIS — G4733 Obstructive sleep apnea (adult) (pediatric): Secondary | ICD-10-CM | POA: Diagnosis not present

## 2016-03-18 DIAGNOSIS — I5020 Unspecified systolic (congestive) heart failure: Secondary | ICD-10-CM | POA: Insufficient documentation

## 2016-03-18 DIAGNOSIS — J449 Chronic obstructive pulmonary disease, unspecified: Secondary | ICD-10-CM | POA: Diagnosis not present

## 2016-03-18 DIAGNOSIS — I5023 Acute on chronic systolic (congestive) heart failure: Secondary | ICD-10-CM | POA: Insufficient documentation

## 2016-03-18 DIAGNOSIS — I5022 Chronic systolic (congestive) heart failure: Secondary | ICD-10-CM | POA: Insufficient documentation

## 2016-03-18 DIAGNOSIS — I5032 Chronic diastolic (congestive) heart failure: Secondary | ICD-10-CM | POA: Insufficient documentation

## 2016-03-18 DIAGNOSIS — J439 Emphysema, unspecified: Secondary | ICD-10-CM

## 2016-03-18 MED ORDER — POTASSIUM CHLORIDE ER 10 MEQ PO TBCR: EXTENDED_RELEASE_TABLET | ORAL | Status: DC

## 2016-03-18 NOTE — Telephone Encounter (Signed)
Patient is currently receiving increased dose of potassium while lasix is increased. She is not on chronic potassium. We will adjust as needed based on levels. If needed, I will then prescribed a 90 day supply.  Martyn Malay, DO PGY-2 Internal Medicine Resident Pager # [phone] 03/18/2016 2:32 PM

## 2016-03-18 NOTE — Assessment & Plan Note (Signed)
A: Acute on Chronic combined CHF I suspect that her low EF is predominately driving her SOB.  P: Increase Lasix to 40mg  BID x4 days and follow up in clinic in 4 days Add Potasium 40mEq for 4 days, check BMP today to adjust as needed. Continue home medications.

## 2016-03-18 NOTE — Assessment & Plan Note (Signed)
A: OSA  P: Has plans for sleep study in 2 weeks

## 2016-03-18 NOTE — Assessment & Plan Note (Signed)
A: COPD without exacerbation  P: Continue home medications, do not think this is AECOPD

## 2016-03-18 NOTE — Progress Notes (Signed)
[facility] Subjective:   Patient ID: [name] female   DOB: [birth year] 65 y.o.   MRN: [id number]  HPI: [name] is a 65 y.o. female with a PMH detailed below who presents for SOB. She was hospitalized about one month ago for hypercapnia and was found to have a worsening of her sCHF.  She was ultimately discharged on 2L of home O2 and increased diuretics/ BB.  She reports that she has not improved since that time.  She has remained on 2L of home O2.  Home health recently came out to see her and noticed some wheezing and encouraged her to make an appointment.   Her daughter is with her today although she lives alone.  We went through her medications and it appears she is taking them as directed. She does admit orthopnea and leg swelling.  No sick contacts, No fever, chills or night sweats.    Past Medical History  Diagnosis Date  . Hyperthyroidism, subclinical   . Post menopausal syndrome   . Obesity   . Cardiac defibrillator in situ     Atlas II VR (SJM) implanted by Dr Lovena Le  . Eczema   . Lipoma   . Chronic ulcer of leg ([place])     04-09-15 resolved-not a problem.  . Hyperlipidemia   . Chronic combined systolic and diastolic heart failure (HCC)     a. EF 35-40% in past;  b. Echo 7/13:  EF 45-50%, Gr 2 diast dysfn, mild AI, mild MAC, trivial MR, mild LAE, PASP 47.  [name] NICM (nonischemic cardiomyopathy) ([place])   . Diabetes mellitus   . HTN (hypertension)   . Elevated alkaline phosphatase level     GGT and 5'nucleotidase 8/13 normal  . Hx of cardiac cath     a. [place] 2003 normal;  b. LHC 6/13:  Mild calcification in the LM, o/w normal coronary arteries, EF 45%.   . Sleep apnea     pt denies 04/12/2013  . COPD (chronic obstructive pulmonary disease) (HCC)     O2 at night  . Automatic implantable cardioverter-defibrillator in situ   . CHF (congestive heart failure) ([place])   . Depression   . Implantable cardioverter-defibrillator (ICD) generator end of  life   . Colon polyps 04/12/2013    Rectosigmoid polyp  . History of oxygen administration     oxygen @ 2 l/m nasally bedtime 24/7  . ATN (acute tubular necrosis) ([place]) 07/15/2014   Current Outpatient Prescriptions  Medication Sig Dispense Refill  . albuterol (PROVENTIL HFA;VENTOLIN HFA) 108 (90 Base) MCG/ACT inhaler Inhale 2 puffs into the lungs every 6 (six) hours as needed for wheezing or shortness of breath. 1 Inhaler 2  . albuterol (PROVENTIL) (2.5 MG/3ML) 0.083% nebulizer solution USE 1 VIAL VIA NEBULIZER EVERY 6 HOURS AS NEEDED FOR WHEEZING OR SHORTNESS OF BREATH 1125 mL 3  . amLODipine-olmesartan (AZOR) 10-40 MG tablet Take 1 tablet by mouth daily. 90 tablet 3  . aspirin EC 81 MG tablet Take 1 tablet (81 mg total) by mouth daily. 90 tablet 3  . atorvastatin (LIPITOR) 80 MG tablet Take 1 tablet (80 mg total) by mouth at bedtime. 90 tablet 3  . furosemide (LASIX) 40 MG tablet Take 1 tablet (40 mg total) by mouth daily. Place Rx on file (filled Rx on 11/11/15) 90 tablet 3  . glucose blood (ONE TOUCH ULTRA TEST) test strip Use as instructed by doctor up to 3x daily,dx code 250.02 insulin requiring 100  each 5  . insulin glargine (LANTUS) 100 UNIT/ML injection Inject 0.35 mLs (35 Units total) into the skin daily. Inject 30 units total into the skin daily until you follow up with your primary care clinic. Then may consider increasing back to 60 units total. 15 mL 5  . insulin lispro (HUMALOG KWIKPEN) 100 UNIT/ML KiwkPen Inject 0.05 mLs (5 Units total) into the skin 3 (three) times daily. 15 mL 3  . Insulin Syringe-Needle U-100 31G X 5/16" 1 ML MISC Use to give Lantus Insulin daily. E11.22. 100 each 11  . metFORMIN (GLUCOPHAGE-XR) 500 MG 24 hr tablet Take 4 tablets (2,000 mg total) by mouth daily with breakfast. 360 tablet 3  . metoprolol succinate (TOPROL-XL) 50 MG 24 hr tablet Take 1 tablet (50 mg total) by mouth daily. 90 tablet 3  . tiotropium (SPIRIVA) 18 MCG inhalation capsule Place 1  capsule (18 mcg total) into inhaler and inhale daily. 30 capsule 11  . diclofenac sodium (VOLTAREN) 1 % GEL Apply 2 g topically 4 (four) times daily. (Patient not taking: Reported on 03/18/2016) 100 g 5  . lidocaine (XYLOCAINE) 5 % ointment Apply 1 application topically as needed. (Patient not taking: Reported on 03/18/2016) 35.44 g 2  . nitroGLYCERIN (NITROSTAT) 0.4 MG SL tablet Place 0.4 mg under the tongue every 5 (five) minutes as needed for chest pain. Reported on 03/18/2016    . omeprazole (PRILOSEC) 20 MG capsule Take 1 capsule (20 mg total) by mouth 2 (two) times daily. (Patient not taking: Reported on 03/18/2016) 60 capsule 1  . ondansetron (ZOFRAN ODT) 4 MG disintegrating tablet Take 1 tablet (4 mg total) by mouth every 8 (eight) hours as needed for nausea. (Patient not taking: Reported on 03/18/2016) 6 tablet 0  . potassium chloride (K-DUR) 10 MEQ tablet Take 102mEq daily while taking increased lasix dose. 30 tablet 0   No current facility-administered medications for this visit.   Family History  Problem Relation Age of Onset  . Stroke Mother   . Seizures Father   . Diabetes Sister   . Asthma Maternal Aunt     aunts  . Asthma Maternal Uncle     uncles  . Heart disease Father   . Heart disease Paternal Aunt     aunts  . Heart disease Paternal Uncle     uncles  . Heart disease Maternal Aunt     aunts  . Heart disease Maternal Uncle     uncles  . Heart disease Maternal Grandfather   . Colon cancer Neg Hx   . Colon polyps Neg Hx   . Esophageal cancer Neg Hx   . Kidney disease Neg Hx   . Gallbladder disease Neg Hx    Social History   Social History  . Marital Status: Widowed    Spouse Name: Alroy Dust  . Number of Children: 3  . Years of Education: 11   Occupational History  . disabled    Social History Main Topics  . Smoking status: Former Smoker -- 0.20 packs/day for 35 years    Types: Cigarettes  . Smokeless tobacco: Never Used     Comment: Quit since d/c from  hospital.  . Alcohol Use: No  . Drug Use: No  . Sexual Activity: Not Asked   Other Topics Concern  . None   Social History Narrative   ** Merged History Encounter **       Married   Review of Systems: Review of Systems  Constitutional: Negative for fever,  chills and malaise/fatigue.  Respiratory: Positive for shortness of breath and wheezing.   Cardiovascular: Positive for orthopnea, leg swelling and PND. Negative for chest pain.  Gastrointestinal: Negative for abdominal pain and diarrhea.  Genitourinary: Negative for dysuria.  Skin: Negative for rash.  Neurological: Negative for dizziness and headaches.     Objective:  Physical Exam: Filed Vitals:   03/18/16 1022  BP: 135/60  Pulse: 86  Temp: 97.8 F (36.6 C)  TempSrc: Oral  Weight: 232 lb 6.4 oz (105.416 kg)  SpO2: 90%  Physical Exam  Constitutional: She is oriented to person, place, and time and well-developed, well-nourished, and in no distress.  Supplemental O2 via [place]  HENT:  Head: Normocephalic and atraumatic.  Eyes: Conjunctivae are normal.  Cardiovascular: Normal rate and regular rhythm.   Pulmonary/Chest: She has wheezes (scattered). She has rales (bi basilar).  Abdominal: Soft. Bowel sounds are normal.  Musculoskeletal: She exhibits edema (2+ to knee bilaterally).  Neurological: She is alert and oriented to person, place, and time.  Psychiatric: Affect normal.  Nursing note and vitals reviewed.   Assessment & Plan:  Case discussed with Dr. Lynnae January  Acute on chronic combined systolic and diastolic heart failure ([place]) A: Acute on Chronic combined CHF I suspect that her low EF is predominately driving her SOB.  P: Increase Lasix to 40mg  BID x4 days and follow up in clinic in 4 days Add Potasium 30mEq for 4 days, check BMP today to adjust as needed. Continue home medications.  COPD (chronic obstructive pulmonary disease) (HCC) A: COPD without exacerbation  P: Continue home medications, do not  think this is AECOPD  Obstructive sleep apnea A: OSA  P: Has plans for sleep study in 2 weeks    Medications Ordered Meds ordered this encounter  Medications  . potassium chloride (K-DUR) 10 MEQ tablet    Sig: Take 32mEq daily while taking increased lasix dose.    Dispense:  30 tablet    Refill:  0   Other Orders Orders Placed This Encounter  Procedures  . BMP8+Anion Gap   Follow Up: Return in about 4 days (around 03/22/2016).

## 2016-03-18 NOTE — Patient Instructions (Addendum)
General Instructions:  I want you to increase lasix to 40mg  twice a day and I want to see you back early next week.  I want you to take 64mEq of potassium for 3 days also.  Please call the clinic if you have issues before that time.  Please bring your medicines with you each time you come to clinic.  Medicines may include prescription medications, over-the-counter medications, herbal remedies, eye drops, vitamins, or other pills.   Progress Toward Treatment Goals:  Treatment Goal 02/13/2015  Hemoglobin A1C improved  Blood pressure at goal  Stop smoking -    Self Care Goals & Plans:  Self Care Goal 12/09/2015  Manage my medications take my medicines as prescribed; bring my medications to every visit; refill my medications on time  Monitor my health keep track of my blood glucose; bring my glucose meter and log to each visit  Eat healthy foods eat more vegetables; eat foods that are low in salt; eat baked foods instead of fried foods  Be physically active find an activity I enjoy  Stop smoking cut down the number of cigarettes smoked    Home Blood Glucose Monitoring 02/13/2015  Check my blood sugar 3 times a day  When to check my blood sugar before meals     Care Management & Community Referrals:  Referral 01/30/2013  Referrals made for care management support diabetes educator; nutritionist  Referrals made to community resources smoking cessation; nutrition

## 2016-03-19 LAB — BMP8+ANION GAP
Anion Gap: 18 mmol/L (ref 10.0–18.0)
BUN/Creatinine Ratio: 18 (ref 12–28)
BUN: 14 mg/dL (ref 8–27)
CO2: 26 mmol/L (ref 18–29)
Calcium: 9.3 mg/dL (ref 8.7–10.3)
Chloride: 98 mmol/L (ref 96–106)
Creatinine, Ser: 0.77 mg/dL (ref 0.57–1.00)
GFR calc Af Amer: 96 mL/min/{1.73_m2} (ref >59)
GFR calc non Af Amer: 83 mL/min/{1.73_m2} (ref >59)
Glucose: 116 mg/dL — ABNORMAL HIGH (ref 65–99)
Potassium: 4.1 mmol/L (ref 3.5–5.2)
Sodium: 142 mmol/L (ref 134–144)

## 2016-03-21 NOTE — Progress Notes (Signed)
Medicine attending: Medical history, presenting problems, physical findings, and medications, reviewed with resident physician Dr Erik Hoffman on the day of the patient visit and I concur with his evaluation and management plan. 

## 2016-03-22 ENCOUNTER — Ambulatory Visit (INDEPENDENT_AMBULATORY_CARE_PROVIDER_SITE_OTHER): Payer: Medicare Other | Admitting: Internal Medicine

## 2016-03-22 ENCOUNTER — Encounter: Payer: Self-pay | Admitting: Internal Medicine

## 2016-03-22 ENCOUNTER — Other Ambulatory Visit: Payer: Self-pay | Admitting: Internal Medicine

## 2016-03-22 VITALS — BP 93/63 | HR 89 | Temp 97.8°F | Ht 60.0 in | Wt 228.7 lb

## 2016-03-22 DIAGNOSIS — E1122 Type 2 diabetes mellitus with diabetic chronic kidney disease: Secondary | ICD-10-CM

## 2016-03-22 DIAGNOSIS — Z87891 Personal history of nicotine dependence: Secondary | ICD-10-CM

## 2016-03-22 DIAGNOSIS — I5043 Acute on chronic combined systolic (congestive) and diastolic (congestive) heart failure: Secondary | ICD-10-CM | POA: Diagnosis not present

## 2016-03-22 DIAGNOSIS — Z794 Long term (current) use of insulin: Secondary | ICD-10-CM | POA: Diagnosis not present

## 2016-03-22 DIAGNOSIS — N182 Chronic kidney disease, stage 2 (mild): Secondary | ICD-10-CM | POA: Diagnosis not present

## 2016-03-22 DIAGNOSIS — Z9981 Dependence on supplemental oxygen: Secondary | ICD-10-CM

## 2016-03-22 MED ORDER — FUROSEMIDE 40 MG PO TABS: 40.0000 mg | ORAL_TABLET | ORAL | Status: DC

## 2016-03-22 NOTE — Patient Instructions (Signed)
I want you to continue to take Lasix 40mg  twice a day for 3 more days (thru Friday) then go back to once a day and I will see you early next week (Monday or Tuesday)

## 2016-03-22 NOTE — Progress Notes (Signed)
[facility] Subjective:   Patient ID: [name] female   DOB: 02/16/1951 65 y.o.   MRN: [id number]  HPI: [name] is a 65 y.o. female with a PMH detailed below who presents for 4 day follow up for acute CHF exacerbation. She has been takign 40mg  of Lasix twice day and supplemental potassium.  Her breathing has improved greatly so much so that she did not bring her supplemental O2 to clinic today, she feels that she can lay back further but not all the way back and the swelling in her feet has gone down.  She does not feel all the way back to the way she felt before the hospitalization but she feels she is on the right track. Her home glucose meter download shows wide variance with some readings in 120s range and some as high as 400s.  She admits missing some doses of insulin and irreguarlly checking her sugars.    Past Medical History  Diagnosis Date  . Hyperthyroidism, subclinical   . Post menopausal syndrome   . Obesity   . Cardiac defibrillator in situ     Atlas II VR (SJM) implanted by Dr Lovena Le  . Eczema   . Lipoma   . Chronic ulcer of leg ([place])     04-09-15 resolved-not a problem.  . Hyperlipidemia   . Chronic combined systolic and diastolic heart failure (HCC)     a. EF 35-40% in past;  b. Echo 7/13:  EF 45-50%, Gr 2 diast dysfn, mild AI, mild MAC, trivial MR, mild LAE, PASP 47.  [name] NICM (nonischemic cardiomyopathy) ([place])   . Diabetes mellitus   . HTN (hypertension)   . Elevated alkaline phosphatase level     GGT and 5'nucleotidase 8/13 normal  . Hx of cardiac cath     a. [place] 2003 normal;  b. [facility] 6/13:  Mild calcification in the LM, o/w normal coronary arteries, EF 45%.   . Sleep apnea     pt denies 04/12/2013  . COPD (chronic obstructive pulmonary disease) (HCC)     O2 at night  . Automatic implantable cardioverter-defibrillator in situ   . CHF (congestive heart failure) ([place])   . Depression   . Implantable cardioverter-defibrillator (ICD)  generator end of life   . Colon polyps 04/12/2013    Rectosigmoid polyp  . History of oxygen administration     oxygen @ 2 l/m nasally bedtime 24/7  . ATN (acute tubular necrosis) ([place]) 07/15/2014   Current Outpatient Prescriptions  Medication Sig Dispense Refill  . albuterol (PROVENTIL HFA;VENTOLIN HFA) 108 (90 Base) MCG/ACT inhaler Inhale 2 puffs into the lungs every 6 (six) hours as needed for wheezing or shortness of breath. 1 Inhaler 2  . albuterol (PROVENTIL) (2.5 MG/3ML) 0.083% nebulizer solution USE 1 VIAL VIA NEBULIZER EVERY 6 HOURS AS NEEDED FOR WHEEZING OR SHORTNESS OF BREATH 1125 mL 3  . amLODipine-olmesartan (AZOR) 10-40 MG tablet Take 1 tablet by mouth daily. 90 tablet 3  . aspirin EC 81 MG tablet Take 1 tablet (81 mg total) by mouth daily. 90 tablet 3  . atorvastatin (LIPITOR) 80 MG tablet Take 1 tablet (80 mg total) by mouth at bedtime. 90 tablet 3  . diclofenac sodium (VOLTAREN) 1 % GEL Apply 2 g topically 4 (four) times daily. (Patient not taking: Reported on 03/18/2016) 100 g 5  . furosemide (LASIX) 40 MG tablet Take 1 tablet (40 mg total) by mouth as directed. 90 tablet 3  .  glucose blood (ONE TOUCH ULTRA TEST) test strip Use as instructed by doctor up to 3x daily,dx code 250.02 insulin requiring 100 each 5  . insulin glargine (LANTUS) 100 UNIT/ML injection Inject 0.35 mLs (35 Units total) into the skin daily. Inject 30 units total into the skin daily until you follow up with your primary care clinic. Then may consider increasing back to 60 units total. 15 mL 5  . insulin lispro (HUMALOG KWIKPEN) 100 UNIT/ML KiwkPen Inject 0.05 mLs (5 Units total) into the skin 3 (three) times daily. 15 mL 3  . Insulin Syringe-Needle U-100 31G X 5/16" 1 ML MISC Use to give Lantus Insulin daily. E11.22. 100 each 11  . lidocaine (XYLOCAINE) 5 % ointment Apply 1 application topically as needed. (Patient not taking: Reported on 03/18/2016) 35.44 g 2  . metFORMIN (GLUCOPHAGE-XR) 500 MG 24 hr tablet  Take 4 tablets (2,000 mg total) by mouth daily with breakfast. 360 tablet 3  . metoprolol succinate (TOPROL-XL) 50 MG 24 hr tablet Take 1 tablet (50 mg total) by mouth daily. 90 tablet 3  . nitroGLYCERIN (NITROSTAT) 0.4 MG SL tablet Place 0.4 mg under the tongue every 5 (five) minutes as needed for chest pain. Reported on 03/18/2016    . omeprazole (PRILOSEC) 20 MG capsule Take 1 capsule (20 mg total) by mouth 2 (two) times daily. (Patient not taking: Reported on 03/18/2016) 60 capsule 1  . ondansetron (ZOFRAN ODT) 4 MG disintegrating tablet Take 1 tablet (4 mg total) by mouth every 8 (eight) hours as needed for nausea. (Patient not taking: Reported on 03/18/2016) 6 tablet 0  . potassium chloride (K-DUR) 10 MEQ tablet Take 32mEq daily while taking increased lasix dose. 30 tablet 0  . tiotropium (SPIRIVA) 18 MCG inhalation capsule Place 1 capsule (18 mcg total) into inhaler and inhale daily. 30 capsule 11   No current facility-administered medications for this visit.   Family History  Problem Relation Age of Onset  . Stroke Mother   . Seizures Father   . Diabetes Sister   . Asthma Maternal Aunt     aunts  . Asthma Maternal Uncle     uncles  . Heart disease Father   . Heart disease Paternal Aunt     aunts  . Heart disease Paternal Uncle     uncles  . Heart disease Maternal Aunt     aunts  . Heart disease Maternal Uncle     uncles  . Heart disease Maternal Grandfather   . Colon cancer Neg Hx   . Colon polyps Neg Hx   . Esophageal cancer Neg Hx   . Kidney disease Neg Hx   . Gallbladder disease Neg Hx    Social History   Social History  . Marital Status: Widowed    Spouse Name: Alroy Dust  . Number of Children: 3  . Years of Education: 11   Occupational History  . disabled    Social History Main Topics  . Smoking status: Former Smoker -- 0.20 packs/day for 35 years    Types: Cigarettes  . Smokeless tobacco: Never Used     Comment: Quit since d/c from hospital.  . Alcohol Use:  No  . Drug Use: No  . Sexual Activity: Not Asked   Other Topics Concern  . None   Social History Narrative   ** Merged History Encounter **       Married   Review of Systems: Review of Systems  Constitutional: Negative for fever, weight loss and  malaise/fatigue.  Respiratory: Negative for cough, shortness of breath and wheezing.   Cardiovascular: Positive for PND. Negative for chest pain and leg swelling.  Gastrointestinal: Negative for abdominal pain.  Genitourinary: Negative for dysuria.  Endo/Heme/Allergies: Negative for polydipsia.     Objective:  Physical Exam: Filed Vitals:   03/22/16 0947  BP: 93/63  Pulse: 89  Temp: 97.8 F (36.6 C)  TempSrc: Oral  Height: 5' (1.524 m)  Weight: 228 lb 11.2 oz (103.738 kg)  SpO2: 88%   Physical Exam  Constitutional: She is well-developed, well-nourished, and in no distress.  HENT:  Head: Normocephalic and atraumatic.  Cardiovascular: Normal rate and regular rhythm.   Pulmonary/Chest: Effort normal. She has no wheezes. She has rales (basilar bilaterally).  Abdominal: Soft. Bowel sounds are normal.  Musculoskeletal: She exhibits edema (1+ bialterally to mid leg).  Nursing note and vitals reviewed.    Assessment & Plan:  Case discussed with Dr. Daryll Drown  Acute on chronic combined systolic and diastolic heart failure ([place]) A; Acute on Chronic combined CHF  P: Has had great improvement with BID lasix, with weight down 4 lbs and improvement in respiratory status.  I will have her continue BID lasix for 3 more days and follow up early next week. Check BMP today I discussed with her that she is still hypoxic when she walked in today, I offered to arrange for home oxygen canister to be delivered here but she refused, she reports she will go directly home and place her home oxygen on at 2L and bring that with her to her next appointment.  Wt Readings from Last 5 Encounters:  03/22/16 228 lb 11.2 oz (103.738 kg)  03/18/16 232 lb 6.4  oz (105.416 kg)  03/09/16 233 lb (105.688 kg)  02/24/16 231 lb 9.6 oz (105.053 kg)  02/13/16 230 lb 9.6 oz (104.6 kg)     Type 2 diabetes mellitus with stage 2 chronic kidney disease ([place]) Difficult to assess with her current meter.  Asked for strict adherence to medication and insulin and at least BID CBG for the next few days, will review her meter again early next week.    Medications Ordered Meds ordered this encounter  Medications  . furosemide (LASIX) 40 MG tablet    Sig: Take 1 tablet (40 mg total) by mouth as directed.    Dispense:  90 tablet    Refill:  3   Other Orders Orders Placed This Encounter  Procedures  . BMP8+Anion Gap   Follow Up: Return in about 6 days (around 03/28/2016).

## 2016-03-23 LAB — BMP8+ANION GAP
Anion Gap: 17 mmol/L (ref 10.0–18.0)
BUN/Creatinine Ratio: 29 — ABNORMAL HIGH (ref 12–28)
BUN: 34 mg/dL — ABNORMAL HIGH (ref 8–27)
CO2: 27 mmol/L (ref 18–29)
Calcium: 9.3 mg/dL (ref 8.7–10.3)
Chloride: 97 mmol/L (ref 96–106)
Creatinine, Ser: 1.16 mg/dL — ABNORMAL HIGH (ref 0.57–1.00)
GFR calc Af Amer: 58 mL/min/{1.73_m2} — ABNORMAL LOW (ref >59)
GFR calc non Af Amer: 51 mL/min/{1.73_m2} — ABNORMAL LOW (ref >59)
Glucose: 115 mg/dL — ABNORMAL HIGH (ref 65–99)
Potassium: 4.7 mmol/L (ref 3.5–5.2)
Sodium: 141 mmol/L (ref 134–144)

## 2016-03-24 NOTE — Assessment & Plan Note (Signed)
Difficult to assess with her current meter.  Asked for strict adherence to medication and insulin and at least BID CBG for the next few days, will review her meter again early next week.

## 2016-03-24 NOTE — Assessment & Plan Note (Signed)
A; Acute on Chronic combined CHF  P: Has had great improvement with BID lasix, with weight down 4 lbs and improvement in respiratory status.  I will have her continue BID lasix for 3 more days and follow up early next week. Check BMP today I discussed with her that she is still hypoxic when she walked in today, I offered to arrange for home oxygen canister to be delivered here but she refused, she reports she will go directly home and place her home oxygen on at 2L and bring that with her to her next appointment.  Wt Readings from Last 5 Encounters:  03/22/16 228 lb 11.2 oz (103.738 kg)  03/18/16 232 lb 6.4 oz (105.416 kg)  03/09/16 233 lb (105.688 kg)  02/24/16 231 lb 9.6 oz (105.053 kg)  02/13/16 230 lb 9.6 oz (104.6 kg)

## 2016-03-28 ENCOUNTER — Ambulatory Visit (INDEPENDENT_AMBULATORY_CARE_PROVIDER_SITE_OTHER): Payer: Medicare Other | Admitting: Internal Medicine

## 2016-03-28 ENCOUNTER — Encounter: Payer: Self-pay | Admitting: Internal Medicine

## 2016-03-28 VITALS — BP 139/63 | HR 94 | Temp 97.5°F | Ht 60.0 in | Wt 230.3 lb

## 2016-03-28 DIAGNOSIS — E1165 Type 2 diabetes mellitus with hyperglycemia: Secondary | ICD-10-CM

## 2016-03-28 DIAGNOSIS — E1122 Type 2 diabetes mellitus with diabetic chronic kidney disease: Secondary | ICD-10-CM

## 2016-03-28 DIAGNOSIS — Z794 Long term (current) use of insulin: Secondary | ICD-10-CM

## 2016-03-28 DIAGNOSIS — I5043 Acute on chronic combined systolic (congestive) and diastolic (congestive) heart failure: Secondary | ICD-10-CM | POA: Diagnosis not present

## 2016-03-28 DIAGNOSIS — N182 Chronic kidney disease, stage 2 (mild): Secondary | ICD-10-CM | POA: Diagnosis not present

## 2016-03-28 DIAGNOSIS — Z9981 Dependence on supplemental oxygen: Secondary | ICD-10-CM

## 2016-03-28 LAB — GLUCOSE, CAPILLARY: Glucose-Capillary: 206 mg/dL — ABNORMAL HIGH (ref 65–99)

## 2016-03-28 MED ORDER — INSULIN LISPRO 100 UNIT/ML (KWIKPEN): PEN_INJECTOR | SUBCUTANEOUS | Status: DC

## 2016-03-28 NOTE — Progress Notes (Signed)
   Subjective:    Patient ID: [name], female    DOB: 1950-12-23, 65 y.o.   MRN: [id number]  HPI Ms. Briscoe is a 65yo woman with PMHx of HTN, chronic systolic and diastolic CHF, COPD, and type 2 DM who presents today for follow up of her CHF.  Combined CHF: EF 20-25% per echo in May 2017. She was recently hospitalized in May for an acute CHF exacerbation and has been followed in the clinic the past few weeks due to continued shortness of breath and titration of her Lasix. Her weight has steadily come down with Lasix 40 mg BID with weight on 6/16 232 lbs and 6/20 228 lbs. She was instructed last visit to continue Lasix 40 mg BID up until her appointment today. She reports she is feeling better, but still SOB if not on her oxygen. She typically uses oxygen only at night, but has required 2 L via [place] during the day for the past week. She has been taking Lasix 40 mg BID as directed. She denies any chest pain. She admits to dietary indiscretion (ate fried chicken yesterday). She has been limiting her water intake to 1.5 bottles that are 16 oz in size.   Type 2 DM: Last A1c 13.7. She is taking Lantus 35 units daily, Humalog 5 units TID, and Metformin 2000 mg daily. She seldom eats lunch so she will skip her lunch time Humalog. She reports she has been testing three times per day, but per review of her glucometer she is testing 1-2 times daily. Most AM blood sugars in 120-200 range and PM blood sugars elevated in the 200-250s.    Review of Systems General: Denies fever, chills, night sweats,  changes in appetite HEENT: Denies headaches, ear pain, changes in vision, rhinorrhea, sore throat CV: See HPI Pulm: Denies cough, wheezing GI: Denies abdominal pain, nausea, vomiting, diarrhea, constipation, melena, hematochezia GU: Denies dysuria, hematuria, frequency Msk: Denies muscle cramps, joint pains Neuro: Denies weakness, numbness, tingling Skin: Denies rashes, bruising Psych: Denies depression,  anxiety, hallucinations    Objective:   Physical Exam  O2 sat: 94% on 2 L oxygen via [place]  General: alert, sitting up, ND HEENT: [place]/AT, EOMI, sclera anicteric, mucus membranes moist CV: RRR, no m/g/r Pulm: crackles diffusely, breaths non-labored on 2 L oxygen via nasal cannula Abd: BS+, soft, obese, non-tender Ext: warm, 1-2+ peripheral edema in bilateral LEs Neuro: alert and oriented x 3    Assessment & Plan:  Please refer to A&P documentation.

## 2016-03-28 NOTE — Assessment & Plan Note (Signed)
Difficult to assess her diabetes control as she only has 1-2 readings per day, but seems most consistent that her blood sugars are more elevated in the evening. I advised her to eat and check her blood sugar three times daily and to move her lunch-time meal to 12-1 pm instead of 3-4 pm to space out insulin dosing more. Will increase her dinner Humalog to 7 units and advised to increase lunchtime Humalog to 7 units if she has a large meal. Will continue Humalog 5 units in AM, Lantus 35 units daily, and Metformin 2000 mg daily. Reassess blood sugars in a few days.

## 2016-03-28 NOTE — Patient Instructions (Addendum)
General Instructions: - Increase Lasix to 60 mg twice daily - Avoid any salty foods, including fried chicken, canned soups, canned vegetables, fast food - Limit water intake to less than a 2 L bottle - For diabetes, increase dinner time Humalog to 7 units. If you eat a big lunch, take 7 units of Humalog. - Check your blood sugar 3 times daily - Blood work today  Please bring your medicines with you each time you come to clinic.  Medicines may include prescription medications, over-the-counter medications, herbal remedies, eye drops, vitamins, or other pills.   Progress Toward Treatment Goals:  Treatment Goal 02/13/2015  Hemoglobin A1C improved  Blood pressure at goal  Stop smoking -    Self Care Goals & Plans:  Self Care Goal 03/28/2016  Manage my medications take my medicines as prescribed; bring my medications to every visit; refill my medications on time  Monitor my health keep track of my blood glucose; bring my glucose meter and log to each visit  Eat healthy foods drink diet soda or water instead of juice or soda; eat more vegetables; eat foods that are low in salt; eat baked foods instead of fried foods; eat fruit for snacks and desserts  Be physically active find an activity I enjoy  Meeting treatment goals maintain the current self-care plan    Home Blood Glucose Monitoring 02/13/2015  Check my blood sugar 3 times a day  When to check my blood sugar before meals     Care Management & Community Referrals:  Referral 01/30/2013  Referrals made for care management support diabetes educator; nutritionist  Referrals made to community resources smoking cessation; nutrition

## 2016-03-28 NOTE — Assessment & Plan Note (Signed)
She has diffuse crackles on exam and is requiring 2 L oxygen via [place]. Currently satting 94%. She is still volume overloaded, but overall stable. Her weight has remained stable in the 230 range. Will increase her Lasix to 60 mg BID and check a bmet today. Will have her follow up in 3 days to reassess her volume status and adjust Lasix as needed.

## 2016-03-29 DIAGNOSIS — J449 Chronic obstructive pulmonary disease, unspecified: Secondary | ICD-10-CM | POA: Diagnosis not present

## 2016-03-29 LAB — BMP8+ANION GAP
Anion Gap: 21 mmol/L — ABNORMAL HIGH (ref 10.0–18.0)
BUN/Creatinine Ratio: 23 (ref 12–28)
BUN: 17 mg/dL (ref 8–27)
CO2: 24 mmol/L (ref 18–29)
Calcium: 9.2 mg/dL (ref 8.7–10.3)
Chloride: 98 mmol/L (ref 96–106)
Creatinine, Ser: 0.75 mg/dL (ref 0.57–1.00)
GFR calc Af Amer: 99 mL/min/{1.73_m2} (ref >59)
GFR calc non Af Amer: 86 mL/min/{1.73_m2} (ref >59)
Glucose: 137 mg/dL — ABNORMAL HIGH (ref 65–99)
Potassium: 4.4 mmol/L (ref 3.5–5.2)
Sodium: 143 mmol/L (ref 134–144)

## 2016-03-29 NOTE — Progress Notes (Signed)
Internal Medicine Clinic Attending  Case discussed with Dr. Hoffman soon after the resident saw the patient.  We reviewed the resident's history and exam and pertinent patient test results.  I agree with the assessment, diagnosis, and plan of care documented in the resident's note. 

## 2016-03-29 NOTE — Progress Notes (Signed)
Internal Medicine Clinic Attending  Case discussed with Dr. Rivet soon after the resident saw the patient.  We reviewed the resident's history and exam and pertinent patient test results.  I agree with the assessment, diagnosis, and plan of care documented in the resident's note.  

## 2016-03-31 ENCOUNTER — Ambulatory Visit (INDEPENDENT_AMBULATORY_CARE_PROVIDER_SITE_OTHER): Payer: Medicare Other | Admitting: Internal Medicine

## 2016-03-31 ENCOUNTER — Encounter: Payer: Self-pay | Admitting: Internal Medicine

## 2016-03-31 VITALS — BP 123/85 | HR 92 | Temp 97.6°F | Ht 61.0 in | Wt 227.3 lb

## 2016-03-31 DIAGNOSIS — E1122 Type 2 diabetes mellitus with diabetic chronic kidney disease: Secondary | ICD-10-CM

## 2016-03-31 DIAGNOSIS — N182 Chronic kidney disease, stage 2 (mild): Secondary | ICD-10-CM | POA: Diagnosis not present

## 2016-03-31 DIAGNOSIS — I5043 Acute on chronic combined systolic (congestive) and diastolic (congestive) heart failure: Secondary | ICD-10-CM

## 2016-03-31 DIAGNOSIS — Z794 Long term (current) use of insulin: Secondary | ICD-10-CM

## 2016-03-31 LAB — GLUCOSE, CAPILLARY: Glucose-Capillary: 112 mg/dL — ABNORMAL HIGH (ref 65–99)

## 2016-03-31 MED ORDER — INSULIN LISPRO 100 UNIT/ML (KWIKPEN): PEN_INJECTOR | SUBCUTANEOUS | Status: DC

## 2016-03-31 NOTE — Assessment & Plan Note (Signed)
Despite her not following my recommendations for her Humalog, her blood sugars are within acceptable range. I asked her to check in the evenings so we can see how her blood sugars are doing at that time. Advised her to continue her current regimen of Humalog 8 units with breakfast and lunch and 5 units with dinner. Will also continue Lantus 35 units daily and Metformin 2000 mg daily.

## 2016-03-31 NOTE — Assessment & Plan Note (Signed)
Overall, her symptoms have improved greatly. She has minimal crackles on exam and is no longer requiring oxygen at rest. Will continue Lasix 80 mg BID for today. She states she has a stress test tomorrow with Cardiology and was instructed to hold her meds. Advised her to decrease to Lasix to 40 mg BID once she is able to resume her home medications. Will have her follow up in 1 week to reassess her volume status. Obtain bmet today.

## 2016-03-31 NOTE — Progress Notes (Signed)
   Subjective:    Patient ID: [name], female    DOB: Jun 20, 1951, 65 y.o.   MRN: [id number]  HPI Ms. Majeski is a 65yo woman with PMHx of HTN, chronic systolic and diastolic HF, type 2 DM, and COPD who presents today for follow up of her CHF.  CHF Exacerbation: She reports her breathing has improved and is no longer requiring oxygen while at rest. She does note SOB if she exerts herself. She denies any chest pain. She reports the swelling in her legs has gone down as well. She has been taking Lasix 80 mg BID since her last visit three days ago.   Type 2 DM: Last A1c 13.7. Last visit, I recommended for her to increase her lunch and dinner Humalog to 7 units. However, she has increased her morning and lunch Humalog to 8 units and continued 5 units with dinner. She is still taking Lantus 35 units daily and Metformin 2000 mg daily. Her blood sugars are stable in the 100-170 range in the morning, but she does not have any dinner time readings.    Review of Systems General: Denies fever, chills, night sweats, changes in weight, changes in appetite HEENT: Denies headaches, ear pain, changes in vision, rhinorrhea, sore throat CV: Denies palpitations, orthopnea Pulm: Denies cough, wheezing GI: Denies abdominal pain, nausea, vomiting, diarrhea, constipation, melena, hematochezia GU: Denies dysuria, hematuria, frequency Msk: Denies muscle cramps, joint pains Neuro: Denies weakness, numbness, tingling Skin: Denies rashes, bruising Psych: Denies depression, anxiety, hallucinations    Objective:   Physical Exam General: obese woman sitting up, NAD HEENT: [place]/AT, EOMI, sclera anicteric, mucus membranes moist CV: RRR, no m/g/r Pulm: crackles bilaterally at bases, mild end expiratory wheezes, breaths non-labored on room air Abd: BS+, soft, obese, non-tender Ext: warm, trace pitting peripheral edema bilaterally Neuro: alert and oriented x 3    Assessment & Plan:  Please refer to A&P  documentation.

## 2016-03-31 NOTE — Patient Instructions (Signed)
General Instructions: - Continue Lasix 80 mg twice daily (160 mg total) for today and tomorrow (6/30) - Then decrease to Lasix 40 mg twice daily until you see your primary care doctor next week - Continue Humalog 8 units with breakfast and lunch. Continue Humalog 5 units with dinner. - Please do more dinner blood sugar checks - Follow up in 1 week  Please bring your medicines with you each time you come to clinic.  Medicines may include prescription medications, over-the-counter medications, herbal remedies, eye drops, vitamins, or other pills.   Progress Toward Treatment Goals:  Treatment Goal 02/13/2015  Hemoglobin A1C improved  Blood pressure at goal  Stop smoking -    Self Care Goals & Plans:  Self Care Goal 03/31/2016  Manage my medications take my medicines as prescribed; bring my medications to every visit; refill my medications on time  Monitor my health keep track of my blood pressure; bring my glucose meter and log to each visit; keep track of my blood glucose  Eat healthy foods eat more vegetables; eat foods that are low in salt  Be physically active find an activity I enjoy  Meeting treatment goals -    Home Blood Glucose Monitoring 02/13/2015  Check my blood sugar 3 times a day  When to check my blood sugar before meals     Care Management & Community Referrals:  Referral 01/30/2013  Referrals made for care management support diabetes educator; nutritionist  Referrals made to community resources smoking cessation; nutrition

## 2016-04-01 ENCOUNTER — Encounter (HOSPITAL_COMMUNITY)
Admission: RE | Admit: 2016-04-01 | Discharge: 2016-04-01 | Disposition: A | Discharge status: Home / Self Care | Payer: Medicare Other | Source: Ambulatory Visit | Attending: Internal Medicine | Admitting: Internal Medicine

## 2016-04-01 DIAGNOSIS — R9439 Abnormal result of other cardiovascular function study: Secondary | ICD-10-CM | POA: Diagnosis not present

## 2016-04-01 DIAGNOSIS — I5042 Chronic combined systolic (congestive) and diastolic (congestive) heart failure: Secondary | ICD-10-CM | POA: Diagnosis not present

## 2016-04-01 DIAGNOSIS — I509 Heart failure, unspecified: Secondary | ICD-10-CM | POA: Diagnosis not present

## 2016-04-01 LAB — BMP8+ANION GAP
Anion Gap: 16 mmol/L (ref 10.0–18.0)
BUN/Creatinine Ratio: 21 (ref 12–28)
BUN: 21 mg/dL (ref 8–27)
CO2: 27 mmol/L (ref 18–29)
Calcium: 9.6 mg/dL (ref 8.7–10.3)
Chloride: 101 mmol/L (ref 96–106)
Creatinine, Ser: 1.01 mg/dL — ABNORMAL HIGH (ref 0.57–1.00)
GFR calc Af Amer: 69 mL/min/{1.73_m2} (ref >59)
GFR calc non Af Amer: 60 mL/min/{1.73_m2} (ref >59)
Glucose: 136 mg/dL — ABNORMAL HIGH (ref 65–99)
Potassium: 4.8 mmol/L (ref 3.5–5.2)
Sodium: 144 mmol/L (ref 134–144)

## 2016-04-01 MED ORDER — REGADENOSON 0.4 MG/5ML IV SOLN
INTRAVENOUS | Status: AC
  Filled 2016-04-01: qty 5

## 2016-04-01 MED ORDER — REGADENOSON 0.4 MG/5ML IV SOLN
0.4000 mg | Freq: Once | INTRAVENOUS | Status: AC
  Administered 2016-04-01: 0.4 mg via INTRAVENOUS

## 2016-04-01 MED ORDER — TECHNETIUM TC 99M TETROFOSMIN IV KIT
10.0000 | PACK | Freq: Once | INTRAVENOUS | Status: AC | PRN
  Administered 2016-04-01: 10 via INTRAVENOUS

## 2016-04-01 MED ORDER — TECHNETIUM TC 99M TETROFOSMIN IV KIT
30.0000 | PACK | Freq: Once | INTRAVENOUS | Status: AC | PRN
  Administered 2016-04-01: 30 via INTRAVENOUS

## 2016-04-01 NOTE — Progress Notes (Signed)
Medicine attending: Medical history, presenting problems, physical findings, and medications, reviewed with resident physician Dr Carly Rivet on the day of the patient visit and I concur with her evaluation and management plan. 

## 2016-04-02 LAB — NM MYOCAR MULTI W/SPECT W/WALL MOTION / EF
Exercise duration (min): 4 min
Rest HR: 80 {beats}/min

## 2016-04-06 ENCOUNTER — Ambulatory Visit (INDEPENDENT_AMBULATORY_CARE_PROVIDER_SITE_OTHER): Payer: Medicare Other | Admitting: Internal Medicine

## 2016-04-06 ENCOUNTER — Encounter: Payer: Self-pay | Admitting: Internal Medicine

## 2016-04-06 VITALS — BP 139/76 | HR 99 | Temp 98.3°F | Wt 232.5 lb

## 2016-04-06 DIAGNOSIS — N182 Chronic kidney disease, stage 2 (mild): Secondary | ICD-10-CM

## 2016-04-06 DIAGNOSIS — E1122 Type 2 diabetes mellitus with diabetic chronic kidney disease: Secondary | ICD-10-CM | POA: Diagnosis not present

## 2016-04-06 DIAGNOSIS — I130 Hypertensive heart and chronic kidney disease with heart failure and stage 1 through stage 4 chronic kidney disease, or unspecified chronic kidney disease: Secondary | ICD-10-CM | POA: Diagnosis not present

## 2016-04-06 DIAGNOSIS — I5043 Acute on chronic combined systolic (congestive) and diastolic (congestive) heart failure: Secondary | ICD-10-CM | POA: Diagnosis not present

## 2016-04-06 DIAGNOSIS — J439 Emphysema, unspecified: Secondary | ICD-10-CM

## 2016-04-06 DIAGNOSIS — J449 Chronic obstructive pulmonary disease, unspecified: Secondary | ICD-10-CM

## 2016-04-06 DIAGNOSIS — Z794 Long term (current) use of insulin: Secondary | ICD-10-CM

## 2016-04-06 DIAGNOSIS — Z9981 Dependence on supplemental oxygen: Secondary | ICD-10-CM

## 2016-04-06 DIAGNOSIS — I10 Essential (primary) hypertension: Secondary | ICD-10-CM

## 2016-04-06 MED ORDER — FLUTICASONE-SALMETEROL 250-50 MCG/DOSE IN AEPB: 1.0000 | INHALATION_SPRAY | Freq: Two times a day (BID) | RESPIRATORY_TRACT | Status: DC

## 2016-04-06 MED ORDER — OMEPRAZOLE 20 MG PO CPDR: 20.0000 mg | DELAYED_RELEASE_CAPSULE | Freq: Two times a day (BID) | ORAL | Status: DC

## 2016-04-06 MED ORDER — FUROSEMIDE 40 MG PO TABS: 80.0000 mg | ORAL_TABLET | ORAL | Status: DC

## 2016-04-06 NOTE — Progress Notes (Signed)
CC: f/u CHF, DM HPI: [name] is a 65 y.o. female with a h/o of CHF, DM, COPD who presents for f/u and management of HF and DM. She was last seen 1 wk ago during which visit her Lassix was titrated down from 80mg  BID to 40mg  BID. She has felt well over the last week w/o episodes of SOB at rest. She continues to wear O2 at night, but has not required it during the day. She does note some minor DOE with activities such as cleaning. She denies CP, cough, fever/chills.  She does note that she has used her albuterol rescue inhaler 4-5x this past week. She continues to take Spiriva 1x/day.  Her DM regimen of 8U breakfast/lunch, 5U dinner, and 35U Lantus ON has controlled her BG well with home readings from 100-160s. She does note that she has forgotten her Lantus several time at night, including last night d/t fireworks and festivities.  She also complains of pain in her right thigh for which she takes PRN ibuprofen with some relief. She has also recently obtained and OTC MSK cream which she plans to use for relief. She denies and weakness or decreased ROM.   Past Medical History  Diagnosis Date  . Hyperthyroidism, subclinical   . Post menopausal syndrome   . Obesity   . Cardiac defibrillator in situ     Atlas II VR (SJM) implanted by Dr [name]  . Eczema   . Lipoma   . Chronic ulcer of leg ([place])     04-09-15 resolved-not a problem.  . Hyperlipidemia   . Chronic combined systolic and diastolic heart failure (HCC)     a. EF 35-40% in past;  b. Echo 7/13:  EF 45-50%, Gr 2 diast dysfn, mild AI, mild MAC, trivial MR, mild LAE, PASP 47.  [name] NICM (nonischemic cardiomyopathy) ([place])   . Diabetes mellitus   . HTN (hypertension)   . Elevated alkaline phosphatase level     GGT and 5'nucleotidase 8/13 normal  . Hx of cardiac cath     a. [place] 2003 normal;  b. LHC 6/13:  Mild calcification in the LM, o/w normal coronary arteries, EF 45%.   . Sleep apnea     pt denies 04/12/2013  . COPD (chronic  obstructive pulmonary disease) (HCC)     O2 at night  . Automatic implantable cardioverter-defibrillator in situ   . CHF (congestive heart failure) ([place])   . Depression   . Implantable cardioverter-defibrillator (ICD) generator end of life   . Colon polyps 04/12/2013    Rectosigmoid polyp  . History of oxygen administration     oxygen @ 2 l/m nasally bedtime 24/7  . ATN (acute tubular necrosis) ([place]) 07/15/2014   Current Outpatient Rx  Name  Route  Sig  Dispense  Refill  . albuterol (PROVENTIL HFA;VENTOLIN HFA) 108 (90 Base) MCG/ACT inhaler   Inhalation   Inhale 2 puffs into the lungs every 6 (six) hours as needed for wheezing or shortness of breath.   1 Inhaler   2   . albuterol (PROVENTIL) (2.5 MG/3ML) 0.083% nebulizer solution      USE 1 VIAL VIA NEBULIZER EVERY 6 HOURS AS NEEDED FOR WHEEZING OR SHORTNESS OF BREATH   1125 mL   3     **Patient requests 90 days supply**   . amLODipine-olmesartan (AZOR) 10-40 MG tablet   Oral   Take 1 tablet by mouth daily.   90 tablet   3   .  aspirin EC 81 MG tablet   Oral   Take 1 tablet (81 mg total) by mouth daily.   90 tablet   3   . atorvastatin (LIPITOR) 80 MG tablet   Oral   Take 1 tablet (80 mg total) by mouth at bedtime.   90 tablet   3   . furosemide (LASIX) 40 MG tablet   Oral   Take 2 tablets (80 mg total) by mouth as directed.   90 tablet   3   . glucose blood (ONE TOUCH ULTRA TEST) test strip      Use as instructed by doctor up to 3x daily,dx code 250.02 insulin requiring   100 each   5   . insulin glargine (LANTUS) 100 UNIT/ML injection   Subcutaneous   Inject 0.35 mLs (35 Units total) into the skin daily. Inject 30 units total into the skin daily until you follow up with your primary care clinic. Then may consider increasing back to 60 units total.   15 mL   5   . insulin lispro (HUMALOG KWIKPEN) 100 UNIT/ML KiwkPen      Take 8 units with breakfast, 8 units with lunch, and 5 units with dinner.   15  mL   3   . Insulin Syringe-Needle U-100 31G X 5/16" 1 ML MISC      Use to give Lantus Insulin daily. E11.22.   100 each   11   . metFORMIN (GLUCOPHAGE-XR) 500 MG 24 hr tablet   Oral   Take 4 tablets (2,000 mg total) by mouth daily with breakfast.   360 tablet   3   . metoprolol succinate (TOPROL-XL) 50 MG 24 hr tablet   Oral   Take 1 tablet (50 mg total) by mouth daily.   90 tablet   3     **Patient requests 90 days supply**   . nitroGLYCERIN (NITROSTAT) 0.4 MG SL tablet   Sublingual   Place 0.4 mg under the tongue every 5 (five) minutes as needed for chest pain. Reported on 03/18/2016         . potassium chloride (K-DUR) 10 MEQ tablet      Take 74mEq daily while taking increased lasix dose.   30 tablet   0   . tiotropium (SPIRIVA) 18 MCG inhalation capsule   Inhalation   Place 1 capsule (18 mcg total) into inhaler and inhale daily.   30 capsule   11   . diclofenac sodium (VOLTAREN) 1 % GEL   Topical   Apply 2 g topically 4 (four) times daily. Patient not taking: Reported on 03/18/2016   100 g   5   . Fluticasone-Salmeterol (ADVAIR DISKUS) 250-50 MCG/DOSE AEPB   Inhalation   Inhale 1 puff into the lungs 2 (two) times daily.   1 each   5   . lidocaine (XYLOCAINE) 5 % ointment   Topical   Apply 1 application topically as needed. Patient not taking: Reported on 03/18/2016   35.44 g   2   . omeprazole (PRILOSEC) 20 MG capsule   Oral   Take 1 capsule (20 mg total) by mouth 2 (two) times daily.   60 capsule   1     Dispense as written.   . ondansetron (ZOFRAN ODT) 4 MG disintegrating tablet   Oral   Take 1 tablet (4 mg total) by mouth every 8 (eight) hours as needed for nausea. Patient not taking: Reported on 03/18/2016   6 tablet  0    Review of Systems: A complete ROS was negative except as per HPI.  Physical Exam: Filed Vitals:   04/06/16 1006 04/06/16 1037  BP: 164/105 139/76  Pulse: 99   Temp: 98.3 F (36.8 C)   TempSrc: Oral     Weight: 232 lb 8 oz (105.461 kg)   SpO2: 93%    General appearance: alert, cooperative, appears stated age and no distress Lungs: wheezes bilaterally, LUL and RUL Heart: regular rate and rhythm, S1, S2 normal, no murmur, click, rub or gallop Extremities: edema 1+ bil [name]  Filed Weights   04/06/16 1006  Weight: 232 lb 8 oz (105.461 kg)     Assessment & Plan:  See encounters tab for problem based medical decision making. Patient seen with Dr. Beryle Beams  Signed: Holley Raring, MD 04/06/2016, 12:13 PM  Pager: [phone]

## 2016-04-06 NOTE — Assessment & Plan Note (Signed)
Well controlled on current regimen. Counseled on not missing Lantus doses at night. Continue current regimen.

## 2016-04-06 NOTE — Assessment & Plan Note (Signed)
Assymptomatic without increased O2 requirement and no SOB at rest. Mild DOE and 5 lbs weight gain since last visit suggest fluid retention. Will plan to increase Lassix to 80mg  BID given normal SCr and f/u in 1 wk. Plan serial weight and BMET to follow renal function at that time.

## 2016-04-06 NOTE — Assessment & Plan Note (Signed)
BP elevated on initial presentation. Manual recheck demonstrated normal BP. Continue current management.

## 2016-04-06 NOTE — Progress Notes (Signed)
Medicine attending: I personally interviewed and briefly examined this patient on the day of the patient visit and reviewed pertinent clinical ,laboratory, and radiographic data  with resident physician Dr. Holley Raring and we discussed a management plan. Lady with chronic CHF & COPD on diuretics & bronchodilators. Uses O2 at night. Titrating diuretics due to recent CHF exacerbation. Max dose furosemide 80 mg BID. Just decreased to 40 mg BID.  No new respiratory sxs but weight up 5 pounds. Using rescue inhaler frequently. Will add inhaled steroid.

## 2016-04-06 NOTE — Assessment & Plan Note (Signed)
Pt has moderate wheezing on exam today and notes uncontrolled symptoms with use of rescue inhaler 4-5x/wk. FEV1 ~50% predicted on last PFTs Sept 2016. Will attempt to obtain better control by adding Advair 250/50 BID to Spiriva.

## 2016-04-06 NOTE — Patient Instructions (Signed)
Your weight is elevated by 5 lbs today. This is most likely due to retained fluid. We will increase your Lassix fluid pill to 80mg  2 times each day in order to help remove this fluid.  We will also add a new inhaled medication called Advair. This will help to control your COPD and wheezing and should help to reduce the number of times you need to use your Albuterol inhaler. You will take this medication 2 times each day IN ADDITION to your Spiriva inhaler. You may still use your albuterol inhaler when needed.  Finally, for your leg pain, you may use ibuprofen regularly every day for 1-2 weeks in order to try to improve your symptoms. In order to protect your stomach while taking the ibuprofen I have prescribed an acid reducing medication, omeprazole, that your should take daily on an empty stomach.  We will see you back in the clinic in 1 week to monitor your weight and adjust your fluid medications if needed.  Please call us if you should experience any increasing shortness of breath or difficulty breathing with activity.

## 2016-04-13 ENCOUNTER — Ambulatory Visit: Payer: Medicare Other

## 2016-04-18 ENCOUNTER — Ambulatory Visit (INDEPENDENT_AMBULATORY_CARE_PROVIDER_SITE_OTHER): Payer: Medicare Other | Admitting: Internal Medicine

## 2016-04-18 VITALS — BP 119/66 | HR 97 | Temp 98.1°F | Ht 61.0 in | Wt 229.4 lb

## 2016-04-18 DIAGNOSIS — I5042 Chronic combined systolic (congestive) and diastolic (congestive) heart failure: Secondary | ICD-10-CM

## 2016-04-18 DIAGNOSIS — J449 Chronic obstructive pulmonary disease, unspecified: Secondary | ICD-10-CM | POA: Diagnosis not present

## 2016-04-18 MED ORDER — OMEPRAZOLE 20 MG PO CPDR: 20.0000 mg | DELAYED_RELEASE_CAPSULE | Freq: Two times a day (BID) | ORAL | Status: DC

## 2016-04-18 MED ORDER — FUROSEMIDE 40 MG PO TABS: 60.0000 mg | ORAL_TABLET | Freq: Two times a day (BID) | ORAL | Status: DC

## 2016-04-18 MED ORDER — ALBUTEROL SULFATE HFA 108 (90 BASE) MCG/ACT IN AERS: 2.0000 | INHALATION_SPRAY | Freq: Four times a day (QID) | RESPIRATORY_TRACT | Status: DC | PRN

## 2016-04-18 NOTE — Assessment & Plan Note (Signed)
Due to significant wheezing and exacerbation requiring albuterol administration last week, Spiriva therapy was augmented with the addition of Advair. Today,

## 2016-04-18 NOTE — Assessment & Plan Note (Addendum)
Lasix titrated 80 mg twice a day following 5 pound weight gain last week. Weight today has responded with 3 pounds loss. Still asymptomatic and denies shortness of breath at rest.  We will plan to decrease Lasix to prevent overdiuresis, and will initiate 60 mg torsemide twice a day. We continue to recommend daily weights and she has been told on notify our clinic if she observes greater than one-pound weight fluctuation from day to day or greater than 5 pounds of weight gain in 1 week.  We will recheck BMP today to assess her kidney function and potassium levels.

## 2016-04-18 NOTE — Addendum Note (Signed)
Addended by: Gilles Chiquito B on: 04/18/2016 02:34 PM   Modules accepted: Level of Service

## 2016-04-18 NOTE — Patient Instructions (Signed)
Her weight is improved this week due to the diuresis. We will plan to decrease your current Lasix dose from 80 mg 2 times every day to 60 mg 2 times every day. This is 1.5 pills in the morning and 1.5 pills in the afternoon. If you notice that you are gaining more than 1 pound each day or gain more than 5 pounds in a week please call our clinic so that we may adjust her medication otherwise we will plan to follow up with your primary doctor in 1-2 months.  Continue your current breathing medications with the Advair and Spiriva. He controlling her symptoms well. We will refill your albuterol. But this medication should only be used if you have exacerbations.  Keep up the good work with regards to your blood sugar. We will not make any changes to your current insulin regimen.

## 2016-04-18 NOTE — Progress Notes (Signed)
CC: Follow-up of CHF and COPD HPI: [name] is a 65 y.o. female with a h/o of hypertension, type 2 diabetes, COPD, and CHF who presents for follow-up of CHF and COPD  Please see Problem-based charting for HPI.  Past Medical History  Diagnosis Date  . Hyperthyroidism, subclinical   . Post menopausal syndrome   . Obesity   . Cardiac defibrillator in situ     Atlas II VR (SJM) implanted by Dr Lovena Le  . Eczema   . Lipoma   . Chronic ulcer of leg ([place])     04-09-15 resolved-not a problem.  . Hyperlipidemia   . Chronic combined systolic and diastolic heart failure (HCC)     a. EF 35-40% in past;  b. Echo 7/13:  EF 45-50%, Gr 2 diast dysfn, mild AI, mild MAC, trivial MR, mild LAE, PASP 47.  [name] NICM (nonischemic cardiomyopathy) ([place])   . Diabetes mellitus   . HTN (hypertension)   . Elevated alkaline phosphatase level     GGT and 5'nucleotidase 8/13 normal  . Hx of cardiac cath     a. [place] 2003 normal;  b. LHC 6/13:  Mild calcification in the LM, o/w normal coronary arteries, EF 45%.   . Sleep apnea     pt denies 04/12/2013  . COPD (chronic obstructive pulmonary disease) (HCC)     O2 at night  . Automatic implantable cardioverter-defibrillator in situ   . CHF (congestive heart failure) ([place])   . Depression   . Implantable cardioverter-defibrillator (ICD) generator end of life   . Colon polyps 04/12/2013    Rectosigmoid polyp  . History of oxygen administration     oxygen @ 2 l/m nasally bedtime 24/7  . ATN (acute tubular necrosis) ([place]) 07/15/2014   Current Outpatient Rx  Name  Route  Sig  Dispense  Refill  . albuterol (PROVENTIL HFA;VENTOLIN HFA) 108 (90 Base) MCG/ACT inhaler   Inhalation   Inhale 2 puffs into the lungs every 6 (six) hours as needed for wheezing or shortness of breath.   1 Inhaler   2   . albuterol (PROVENTIL) (2.5 MG/3ML) 0.083% nebulizer solution      USE 1 VIAL VIA NEBULIZER EVERY 6 HOURS AS NEEDED FOR WHEEZING OR SHORTNESS OF BREATH   1125 mL  3     **Patient requests 90 days supply**   . amLODipine-olmesartan (AZOR) 10-40 MG tablet   Oral   Take 1 tablet by mouth daily.   90 tablet   3   . aspirin EC 81 MG tablet   Oral   Take 1 tablet (81 mg total) by mouth daily.   90 tablet   3   . atorvastatin (LIPITOR) 80 MG tablet   Oral   Take 1 tablet (80 mg total) by mouth at bedtime.   90 tablet   3   . diclofenac sodium (VOLTAREN) 1 % GEL   Topical   Apply 2 g topically 4 (four) times daily. Patient not taking: Reported on 03/18/2016   100 g   5   . Fluticasone-Salmeterol (ADVAIR DISKUS) 250-50 MCG/DOSE AEPB   Inhalation   Inhale 1 puff into the lungs 2 (two) times daily.   1 each   5   . furosemide (LASIX) 40 MG tablet   Oral   Take 1.5 tablets (60 mg total) by mouth 2 (two) times daily.   90 tablet   3   . glucose blood (ONE TOUCH ULTRA TEST) test strip  Use as instructed by doctor up to 3x daily,dx code 250.02 insulin requiring   100 each   5   . insulin glargine (LANTUS) 100 UNIT/ML injection   Subcutaneous   Inject 0.35 mLs (35 Units total) into the skin daily. Inject 30 units total into the skin daily until you follow up with your primary care clinic. Then may consider increasing back to 60 units total.   15 mL   5   . insulin lispro (HUMALOG KWIKPEN) 100 UNIT/ML KiwkPen      Take 8 units with breakfast, 8 units with lunch, and 5 units with dinner.   15 mL   3   . Insulin Syringe-Needle U-100 31G X 5/16" 1 ML MISC      Use to give Lantus Insulin daily. E11.22.   100 each   11   . lidocaine (XYLOCAINE) 5 % ointment   Topical   Apply 1 application topically as needed. Patient not taking: Reported on 03/18/2016   35.44 g   2   . metFORMIN (GLUCOPHAGE-XR) 500 MG 24 hr tablet   Oral   Take 4 tablets (2,000 mg total) by mouth daily with breakfast.   360 tablet   3   . metoprolol succinate (TOPROL-XL) 50 MG 24 hr tablet   Oral   Take 1 tablet (50 mg total) by mouth daily.   90  tablet   3     **Patient requests 90 days supply**   . nitroGLYCERIN (NITROSTAT) 0.4 MG SL tablet   Sublingual   Place 0.4 mg under the tongue every 5 (five) minutes as needed for chest pain. Reported on 03/18/2016         . omeprazole (PRILOSEC) 20 MG capsule   Oral   Take 1 capsule (20 mg total) by mouth 2 (two) times daily.   60 capsule   1     Dispense as written.   . ondansetron (ZOFRAN ODT) 4 MG disintegrating tablet   Oral   Take 1 tablet (4 mg total) by mouth every 8 (eight) hours as needed for nausea. Patient not taking: Reported on 03/18/2016   6 tablet   0   . potassium chloride (K-DUR) 10 MEQ tablet      Take 28mEq daily while taking increased lasix dose.   30 tablet   0   . tiotropium (SPIRIVA) 18 MCG inhalation capsule   Inhalation   Place 1 capsule (18 mcg total) into inhaler and inhale daily.   30 capsule   11    Review of Systems: ROS in HPI. Otherwise, Denies chest pain, to bowel habits, changes in appetite, urinary symptoms.  Physical Exam: Filed Vitals:   04/18/16 1001  BP: 119/66  Pulse: 97  Temp: 98.1 F (36.7 C)  TempSrc: Oral  Height: 5\' 1"  (1.549 m)  Weight: 229 lb 6.4 oz (104.055 kg)  SpO2: 97%   General appearance: alert, cooperative, appears stated age and no distress Head: Normocephalic, without obvious abnormality, atraumatic Lungs: clear to auscultation bilaterally Heart: regular rate and rhythm, S1, S2 normal, no murmur, click, rub or gallop Extremities: extremities normal, atraumatic, no cyanosis, edema trace BLE Skin: Skin color, texture, turgor normal. No rashes or lesions except erythematous plaque on the flexor surfaces of bilateral UE around the decubitus fossa, pruitic  Assessment & Plan:  See encounters tab for problem based medical decision making. Patient seen with Dr. Daryll Drown  Signed: Holley Raring, MD 04/18/2016, 10:30 AM  Pager: [phone]

## 2016-04-18 NOTE — Progress Notes (Signed)
Internal Medicine Clinic Attending  I saw and evaluated the patient.  I personally confirmed the key portions of the history and exam documented by Dr. Strelow and I reviewed pertinent patient test results.  The assessment, diagnosis, and plan were formulated together and I agree with the documentation in the resident's note. 

## 2016-04-19 LAB — BMP8+ANION GAP
Anion Gap: 18 mmol/L (ref 10.0–18.0)
BUN/Creatinine Ratio: 30 — ABNORMAL HIGH (ref 12–28)
BUN: 26 mg/dL (ref 8–27)
CO2: 27 mmol/L (ref 18–29)
Calcium: 9.8 mg/dL (ref 8.7–10.3)
Chloride: 98 mmol/L (ref 96–106)
Creatinine, Ser: 0.87 mg/dL (ref 0.57–1.00)
GFR calc Af Amer: 83 mL/min/{1.73_m2} (ref >59)
GFR calc non Af Amer: 72 mL/min/{1.73_m2} (ref >59)
Glucose: 162 mg/dL — ABNORMAL HIGH (ref 65–99)
Potassium: 4.2 mmol/L (ref 3.5–5.2)
Sodium: 143 mmol/L (ref 134–144)

## 2016-04-28 DIAGNOSIS — J449 Chronic obstructive pulmonary disease, unspecified: Secondary | ICD-10-CM | POA: Diagnosis not present

## 2016-05-04 ENCOUNTER — Ambulatory Visit (HOSPITAL_BASED_OUTPATIENT_CLINIC_OR_DEPARTMENT_OTHER): Payer: Medicare Other | Attending: Internal Medicine | Admitting: Internal Medicine

## 2016-05-04 VITALS — Ht 60.0 in | Wt 226.0 lb

## 2016-05-04 DIAGNOSIS — R0683 Snoring: Secondary | ICD-10-CM | POA: Diagnosis not present

## 2016-05-04 DIAGNOSIS — G4733 Obstructive sleep apnea (adult) (pediatric): Secondary | ICD-10-CM | POA: Diagnosis not present

## 2016-05-04 DIAGNOSIS — G4736 Sleep related hypoventilation in conditions classified elsewhere: Secondary | ICD-10-CM | POA: Insufficient documentation

## 2016-05-04 DIAGNOSIS — I493 Ventricular premature depolarization: Secondary | ICD-10-CM | POA: Diagnosis not present

## 2016-05-10 DIAGNOSIS — G4733 Obstructive sleep apnea (adult) (pediatric): Secondary | ICD-10-CM | POA: Diagnosis not present

## 2016-05-10 NOTE — Procedures (Signed)
   Patient Name: [name], [name] Date: 05/04/2016 Gender: Female D.O.B: 11/26/1950 Age (years): 64 Referring Provider: Oval Linsey Height (inches): 47 Interpreting Physician: Baird Lyons MD, ABSM Weight (lbs): 226 RPSGT: Earney [place] BMI: 35 MRN: [id number] Neck Size: 16.00 CLINICAL INFORMATION Sleep Study Type: NPSG Indication for sleep study: OSA Epworth Sleepiness Score: 14  SLEEP STUDY TECHNIQUE As per the AASM Manual for the Scoring of Sleep and Associated Events v2.3 (April 2016) with a hypopnea requiring 4% desaturations. The channels recorded and monitored were frontal, central and occipital EEG, electrooculogram (EOG), submentalis EMG (chin), nasal and oral airflow, thoracic and abdominal wall motion, anterior tibialis EMG, snore microphone, electrocardiogram, and pulse oximetry.  MEDICATIONS Patient's medications include: charted for review. Medications self-administered by patient during sleep study : No sleep medicine administered.  SLEEP ARCHITECTURE The study was initiated at 10:14:02 PM and ended at 5:00:30 AM. Sleep onset time was 10.2 minutes and the sleep efficiency was 74.5%. The total sleep time was 302.8 minutes. Stage REM latency was 50.5 minutes. The patient spent 5.12% of the night in stage N1 sleep, 76.22% in stage N2 sleep, 0.00% in stage N3 and 18.66% in REM. Alpha intrusion was absent. Supine sleep was 22.02%. Wake after sleep onset 93 minutes  RESPIRATORY PARAMETERS The overall apnea/hypopnea index (AHI) was 4.0 per hour. There were 0 total apneas, including 0 obstructive, 0 central and 0 mixed apneas. There were 20 hypopneas and 6 RERAs. The AHI during Stage REM sleep was 8.5 per hour. AHI while supine was 18.0 per hour. The mean oxygen saturation was 92.91%. The minimum SpO2 during sleep was 71.00%. Supplemental O2 2L/m was added per protocol because of sstained desaturation < 88%. Soft snoring was noted during this  study.  CARDIAC DATA The 2 lead EKG demonstrated sinus rhythm. The mean heart rate was 79.19 beats per minute. Other EKG findings include: PVCs.  LEG MOVEMENT DATA The total PLMS were 51 with a resulting PLMS index of 10.11. Associated arousal with leg movement index was 0.2 .  IMPRESSIONS - No significant obstructive sleep apnea occurred during this study (AHI = 4.0/h). - Sustained cough was common when attempting to sleep supine.  - No significant central sleep apnea occurred during this study (CAI = 0.0/h). - Moderate oxygen desaturation was noted during this study (Min O2 = 71.00%). - Supplemental O2 was added per protocl for sustained O2 saturation < 88%. - The patient snored with Soft snoring volume. - EKG findings include PVCs. - Mild periodic limb movements of sleep occurred during the study. No significant associated arousals.  DIAGNOSIS - Nocturnal Hypoxemia (327.26 [G47.36 ICD-10])  RECOMMENDATIONS - Positional therapy avoiding supine position during sleep. - Avoid alcohol, sedatives and other CNS depressants that may worsen sleep apnea and disrupt normal sleep architecture. - Sleep hygiene should be reviewed to assess factors that may improve sleep quality. - Weight management and regular exercise should be initiated or continued if appropriate. - Consider evaluation for home O2 during sleep with implication of possible cardiopulmonary disease.  [Electronically signed] 05/10/2016 03:06 PM  Baird Lyons MD, [place], American Board of Sleep Medicine   NPI: [id number] [place], [place] [facility]  ELECTRONICALLY SIGNED ON:  05/10/2016, 2:59 PM [place] PH: [phone]   FX: [phone] [place]

## 2016-05-29 DIAGNOSIS — J449 Chronic obstructive pulmonary disease, unspecified: Secondary | ICD-10-CM | POA: Diagnosis not present

## 2016-06-01 ENCOUNTER — Encounter: Payer: Self-pay | Admitting: Internal Medicine

## 2016-06-01 ENCOUNTER — Ambulatory Visit (INDEPENDENT_AMBULATORY_CARE_PROVIDER_SITE_OTHER): Payer: Medicare Other | Admitting: Internal Medicine

## 2016-06-01 VITALS — BP 103/83 | HR 86 | Temp 97.9°F | Wt 226.4 lb

## 2016-06-01 DIAGNOSIS — E1169 Type 2 diabetes mellitus with other specified complication: Secondary | ICD-10-CM | POA: Diagnosis not present

## 2016-06-01 DIAGNOSIS — E1122 Type 2 diabetes mellitus with diabetic chronic kidney disease: Secondary | ICD-10-CM | POA: Diagnosis not present

## 2016-06-01 DIAGNOSIS — Z794 Long term (current) use of insulin: Secondary | ICD-10-CM

## 2016-06-01 DIAGNOSIS — Z23 Encounter for immunization: Secondary | ICD-10-CM | POA: Diagnosis not present

## 2016-06-01 DIAGNOSIS — I5042 Chronic combined systolic (congestive) and diastolic (congestive) heart failure: Secondary | ICD-10-CM

## 2016-06-01 DIAGNOSIS — F17210 Nicotine dependence, cigarettes, uncomplicated: Secondary | ICD-10-CM

## 2016-06-01 DIAGNOSIS — Z0000 Encounter for general adult medical examination without abnormal findings: Secondary | ICD-10-CM

## 2016-06-01 DIAGNOSIS — Z79899 Other long term (current) drug therapy: Secondary | ICD-10-CM

## 2016-06-01 DIAGNOSIS — N182 Chronic kidney disease, stage 2 (mild): Secondary | ICD-10-CM

## 2016-06-01 DIAGNOSIS — E784 Other hyperlipidemia: Secondary | ICD-10-CM

## 2016-06-01 DIAGNOSIS — E1165 Type 2 diabetes mellitus with hyperglycemia: Secondary | ICD-10-CM

## 2016-06-01 DIAGNOSIS — Z9981 Dependence on supplemental oxygen: Secondary | ICD-10-CM

## 2016-06-01 DIAGNOSIS — G4734 Idiopathic sleep related nonobstructive alveolar hypoventilation: Secondary | ICD-10-CM

## 2016-06-01 LAB — POCT GLYCOSYLATED HEMOGLOBIN (HGB A1C): Hemoglobin A1C: 8.7

## 2016-06-01 LAB — GLUCOSE, CAPILLARY: Glucose-Capillary: 101 mg/dL — ABNORMAL HIGH (ref 65–99)

## 2016-06-01 MED ORDER — INSULIN GLARGINE 100 UNIT/ML ~~LOC~~ SOLN: 37.0000 [IU] | Freq: Every day | SUBCUTANEOUS | 5 refills | Status: DC

## 2016-06-01 MED ORDER — FUROSEMIDE 20 MG PO TABS: 60.0000 mg | ORAL_TABLET | Freq: Two times a day (BID) | ORAL | 2 refills | Status: DC

## 2016-06-01 MED ORDER — INSULIN LISPRO 100 UNIT/ML (KWIKPEN): PEN_INJECTOR | SUBCUTANEOUS | 3 refills | Status: DC

## 2016-06-01 MED ORDER — ACETAMINOPHEN 325 MG PO TABS: 650.0000 mg | ORAL_TABLET | Freq: Four times a day (QID) | ORAL | 2 refills | Status: DC | PRN

## 2016-06-01 MED ORDER — GLIPIZIDE 5 MG PO TABS: 5.0000 mg | ORAL_TABLET | Freq: Two times a day (BID) | ORAL | 2 refills | Status: DC

## 2016-06-01 NOTE — Assessment & Plan Note (Addendum)
Lab Results  Component Value Date   HGBA1C 8.7 06/01/2016   HGBA1C 13.7 02/10/2016   HGBA1C 12.8 11/11/2015     Assessment: Diabetes control:  Uncontrolled Progress toward A1C goal:   Greatly improved Comments: Compliant with Metformin 1000 mg BID, Lantus 35 units QHS, and Humalog 8 units before breakfast, 8 units before lunch, 5 units before dinner. Fasting glucose mildly elevated in the 140s, high before breakfast and lunch in the 2-300s.   Plan: Medications:  Increase Lantus to 37 units. Increase Humalog to 10/10/5. Continue Metformin. Add Glipizide 5 mg BID.  Home glucose monitoring: Frequency:  QID Timing:  ACHS Other plans: Follow up in 4 weeks

## 2016-06-01 NOTE — Assessment & Plan Note (Signed)
Patient reports compliance with atorvastatin

## 2016-06-01 NOTE — Assessment & Plan Note (Signed)
Flu Shot- Received Mammogram- Normal April 2016 Colon Cancer Screening- Normal July 2016, repeat in 2026 Pap Smear- Unclear last pap, needs to be addressed at follow up Lung Cancer Screening- Needs to be addressed at follow up

## 2016-06-01 NOTE — Assessment & Plan Note (Addendum)
Patient has a history of NICM and Chronic Combined HF (EF 20-25% with Grade 1 Diastolic Dysfunction) s/p ICD placement. Patient is currently on Lasix 80 mg BID, Amlodipine 10 mg daily, Olmesartan 40 mg daily, and metoprolol 50 mg daily. She states her symptoms are currently well controlled and denies DOE, shortness of breath or lower extremity edema. She feels when she is at a lower dose of lasix (40 mg BID), her swelling and breathing worsen. During admission in May 2017, TTE revealed worsened EF of 20-25%. Cardiology recommended pursuing outpatient myoview. Myoview showed moderate reversible perfusion defect in the anterolateral wall, consistent with inducible ischemia. Partially reversible perfusion defect in the septal wall, suggesting myocardial scar with peri-infarct ischemia. LHC in 2013 showed no significant CAD. She denies chest pain or shortness of breath. Patient is euvolemic on exam. Patient is to follow up with Cardiology on 06/09/16. She may be one to consider for repeat LHC given myoview results and also optimization of her medication regimen with hydralazine and imdur versus starting spironolactone. I will discuss with Cardiology to see how can help facilitate.   Plan: -Continue BB, ARB, CCB -Decrease Lasix to 60 mg BID -Follow up with Cardiology -Consider addition of Imdur/Hydralazine versus spironolactone -Repeat BMET

## 2016-06-01 NOTE — Assessment & Plan Note (Signed)
Sleep study in August 2017 showed no significant obstructive sleep apnea (AHI = 4.0/h), sustained cough was common when attempting to sleep supine, no significant central sleep apnea (CAI = 0.0/h), but moderate oxygen desaturation (Min O2 = 71.00%).  Diagnosis: - Nocturnal Hypoxemia   Plan: -Continue oxygen via [place] at 2L at night -Avoiding supine position during sleep -Weight loss

## 2016-06-01 NOTE — Patient Instructions (Addendum)
For your diabetes: -Take Lantus 37 units at night -Take Humalog 10 units before breakfast, 10 units before lunch and 5 units before dinner -Continue taking Metformin as prescribed -Start taking Glipizide 5 mg twice a day  For your heart: -Take Furosemide 60 mg twice a day -Continue taking Metoprolol 50 mg once a day -Continue taking amlodipine 10 mg-olmesartan 40 mg once a day -Continue taking atorvastatin 80 mg daily -Continue Aspirin once a day  -Follow up with Dr. Stanford Breed on 06/09/16  For your COPD: -Continue using your inhalers  We will check your kidney function today as well  Take Tylenol for pain

## 2016-06-01 NOTE — Progress Notes (Signed)
    CC: T2DM  HPI: [name] is a 65 y.o. female with PMHx of Chronic Combined CHF, HTN, COPD, NICM, and T2DM who presents to the clinic for follow up for T2DM. Please see problem based charting for more information.  Patient is eating and drinking well. She denies fever, chills, chest pain, nausea or vomiting.   Past Medical History:  Diagnosis Date  . ATN (acute tubular necrosis) ([place]) 07/15/2014  . Automatic implantable cardioverter-defibrillator in situ   . Cardiac defibrillator in situ    Atlas II VR (SJM) implanted by Dr Lovena Le  . CHF (congestive heart failure) ([place])   . Chronic combined systolic and diastolic heart failure (HCC)    a. EF 35-40% in past;  b. Echo 7/13:  EF 45-50%, Gr 2 diast dysfn, mild AI, mild MAC, trivial MR, mild LAE, PASP 47.  [name] Chronic ulcer of leg ([place])    04-09-15 resolved-not a problem.  . Colon polyps 04/12/2013   Rectosigmoid polyp  . COPD (chronic obstructive pulmonary disease) (HCC)    O2 at night  . Depression   . Diabetes mellitus   . Eczema   . Elevated alkaline phosphatase level    GGT and 5'nucleotidase 8/13 normal  . History of oxygen administration    oxygen @ 2 l/m nasally bedtime 24/7  . HTN (hypertension)   . Hx of cardiac cath    a. [place] 2003 normal;  b. LHC 6/13:  Mild calcification in the LM, o/w normal coronary arteries, EF 45%.   . Hyperlipidemia   . Hyperthyroidism, subclinical   . Implantable cardioverter-defibrillator (ICD) generator end of life   . Lipoma   . NICM (nonischemic cardiomyopathy) ([place])   . Obesity   . Post menopausal syndrome   . Sleep apnea    pt denies 04/12/2013    Review of Systems: Please see pertinent ROS reviewed in HPI and problem based charting.   Physical Exam: Vitals:   06/01/16 1320  BP: 103/83  Pulse: 86  Temp: 97.9 F (36.6 C)  TempSrc: Oral  SpO2: 92%  Weight: 226 lb 6.4 oz (102.7 kg)   General: Vital signs reviewed.  Patient is well-developed and well-nourished, in no acute  distress and cooperative with exam.  Cardiovascular: RRR, S1 normal, S2 normal, no murmurs, gallops, or rubs. ICD in place. Pulmonary/Chest: Scattered inspiratory squeaks, no rales, or rhonchi. Extremities: No lower extremity edema bilaterally, pulses symmetric and intact bilaterally. Skin: Warm, dry and intact. No rashes or erythema. Psychiatric: Normal mood and affect. speech and behavior is normal. Cognition and memory are grossly normal.   Assessment & Plan:  See encounters tab for problem based medical decision making. Patient discussed with Dr. Evette Doffing.

## 2016-06-02 LAB — BMP8+ANION GAP
Anion Gap: 19 mmol/L — ABNORMAL HIGH (ref 10.0–18.0)
BUN/Creatinine Ratio: 19 (ref 12–28)
BUN: 18 mg/dL (ref 8–27)
CO2: 26 mmol/L (ref 18–29)
Calcium: 9.5 mg/dL (ref 8.7–10.3)
Chloride: 100 mmol/L (ref 96–106)
Creatinine, Ser: 0.94 mg/dL (ref 0.57–1.00)
GFR calc Af Amer: 75 mL/min/{1.73_m2} (ref >59)
GFR calc non Af Amer: 65 mL/min/{1.73_m2} (ref >59)
Glucose: 88 mg/dL (ref 65–99)
Potassium: 4.1 mmol/L (ref 3.5–5.2)
Sodium: 145 mmol/L — ABNORMAL HIGH (ref 134–144)

## 2016-06-02 NOTE — Progress Notes (Signed)
Internal Medicine Clinic Attending  Case discussed with Dr. Burns soon after the resident saw the patient.  We reviewed the resident's history and exam and pertinent patient test results.  I agree with the assessment, diagnosis, and plan of care documented in the resident's note. 

## 2016-06-08 ENCOUNTER — Other Ambulatory Visit: Payer: Self-pay | Admitting: Internal Medicine

## 2016-06-08 DIAGNOSIS — Z1231 Encounter for screening mammogram for malignant neoplasm of breast: Secondary | ICD-10-CM

## 2016-06-08 NOTE — Progress Notes (Deleted)
HPI: FU chronic combined systolic and diastolic CHF. She has a hx of NICM, HTN, HL, DM2, s/p AICD implantation. LHC 6/13: Mild calcification in the LM, o/w normal coronary arteries, EF 45%. Last echocardiogram May 2017 showed ejection fraction 0000000, grade 1 diastolic dysfunction and mildly elevated pulmonary pressure. Admitted with CHF May 2017 and outpatient nuclear study arranged to exclude ischemia. Nuclear study June 2017 showed ejection fraction 33% and ischemia in the anterolateral wall and septum. Since I last saw her,    Current Outpatient Prescriptions  Medication Sig Dispense Refill  . acetaminophen (TYLENOL) 325 MG tablet Take 2 tablets (650 mg total) by mouth every 6 (six) hours as needed. 100 tablet 2  . albuterol (PROVENTIL HFA;VENTOLIN HFA) 108 (90 Base) MCG/ACT inhaler Inhale 2 puffs into the lungs every 6 (six) hours as needed for wheezing or shortness of breath. 1 Inhaler 2  . albuterol (PROVENTIL) (2.5 MG/3ML) 0.083% nebulizer solution USE 1 VIAL VIA NEBULIZER EVERY 6 HOURS AS NEEDED FOR WHEEZING OR SHORTNESS OF BREATH 1125 mL 3  . amLODipine-olmesartan (AZOR) 10-40 MG tablet Take 1 tablet by mouth daily. 90 tablet 3  . aspirin EC 81 MG tablet Take 1 tablet (81 mg total) by mouth daily. 90 tablet 3  . atorvastatin (LIPITOR) 80 MG tablet Take 1 tablet (80 mg total) by mouth at bedtime. 90 tablet 3  . diclofenac sodium (VOLTAREN) 1 % GEL Apply 2 g topically 4 (four) times daily. (Patient not taking: Reported on 03/18/2016) 100 g 5  . Fluticasone-Salmeterol (ADVAIR DISKUS) 250-50 MCG/DOSE AEPB Inhale 1 puff into the lungs 2 (two) times daily. 1 each 5  . furosemide (LASIX) 20 MG tablet Take 3 tablets (60 mg total) by mouth 2 (two) times daily. 180 tablet 2  . glipiZIDE (GLUCOTROL) 5 MG tablet Take 1 tablet (5 mg total) by mouth 2 (two) times daily before a meal. 60 tablet 2  . glucose blood (ONE TOUCH ULTRA TEST) test strip Use as instructed by doctor up to 3x daily,dx code  250.02 insulin requiring 100 each 5  . insulin glargine (LANTUS) 100 UNIT/ML injection Inject 0.37 mLs (37 Units total) into the skin daily. 15 mL 5  . insulin lispro (HUMALOG KWIKPEN) 100 UNIT/ML KiwkPen Take 10 units with breakfast, 10 units with lunch, and 5 units with dinner. 15 mL 3  . Insulin Syringe-Needle U-100 31G X 5/16" 1 ML MISC Use to give Lantus Insulin daily. E11.22. 100 each 11  . lidocaine (XYLOCAINE) 5 % ointment Apply 1 application topically as needed. (Patient not taking: Reported on 03/18/2016) 35.44 g 2  . metFORMIN (GLUCOPHAGE-XR) 500 MG 24 hr tablet Take 4 tablets (2,000 mg total) by mouth daily with breakfast. 360 tablet 3  . metoprolol succinate (TOPROL-XL) 50 MG 24 hr tablet Take 1 tablet (50 mg total) by mouth daily. 90 tablet 3  . nitroGLYCERIN (NITROSTAT) 0.4 MG SL tablet Place 0.4 mg under the tongue every 5 (five) minutes as needed for chest pain. Reported on 03/18/2016    . omeprazole (PRILOSEC) 20 MG capsule Take 1 capsule (20 mg total) by mouth 2 (two) times daily. 90 capsule 1  . tiotropium (SPIRIVA) 18 MCG inhalation capsule Place 1 capsule (18 mcg total) into inhaler and inhale daily. 30 capsule 11   No current facility-administered medications for this visit.      Past Medical History:  Diagnosis Date  . ATN (acute tubular necrosis) ([place]) 07/15/2014  . Automatic implantable cardioverter-defibrillator in situ   .  Cardiac defibrillator in situ    Atlas II VR (SJM) implanted by Dr Lovena Le  . CHF (congestive heart failure) ([place])   . Chronic combined systolic and diastolic heart failure (HCC)    a. EF 35-40% in past;  b. Echo 7/13:  EF 45-50%, Gr 2 diast dysfn, mild AI, mild MAC, trivial MR, mild LAE, PASP 47.  [name] Chronic ulcer of leg ([place])    04-09-15 resolved-not a problem.  . Colon polyps 04/12/2013   Rectosigmoid polyp  . COPD (chronic obstructive pulmonary disease) (HCC)    O2 at night  . Depression   . Diabetes mellitus   . Eczema   . Elevated  alkaline phosphatase level    GGT and 5'nucleotidase 8/13 normal  . History of oxygen administration    oxygen @ 2 l/m nasally bedtime 24/7  . HTN (hypertension)   . Hx of cardiac cath    a. [place] 2003 normal;  b. LHC 6/13:  Mild calcification in the LM, o/w normal coronary arteries, EF 45%.   . Hyperlipidemia   . Hyperthyroidism, subclinical   . Implantable cardioverter-defibrillator (ICD) generator end of life   . Lipoma   . NICM (nonischemic cardiomyopathy) ([place])   . Obesity   . Post menopausal syndrome   . Sleep apnea    pt denies 04/12/2013    Past Surgical History:  Procedure Laterality Date  . ABDOMINAL HYSTERECTOMY    . CARDIAC CATHETERIZATION    . CARDIAC DEFIBRILLATOR PLACEMENT  05/04/2007   SJM Atlas II VR ICD  . CARDIAC DEFIBRILLATOR PLACEMENT    . COLONOSCOPY N/A 04/12/2013   Procedure: COLONOSCOPY;  Surgeon: Beryle Beams, MD;  Location: [facility]COPY;  Service: Endoscopy;  Laterality: N/A;  pt.has defibrilator  . COLONOSCOPY N/A 04/16/2015   Procedure: COLONOSCOPY;  Surgeon: [name], MD;  Location: [facility];  Service: Endoscopy;  Laterality: N/A;  . ESOPHAGOGASTRODUODENOSCOPY N/A 04/16/2015   Procedure: ESOPHAGOGASTRODUODENOSCOPY (EGD);  Surgeon: [name], MD;  Location: [facility];  Service: Endoscopy;  Laterality: N/A;  . HERNIA REPAIR    . HYSTEROSCOPY    . IMPLANTABLE CARDIOVERTER DEFIBRILLATOR (ICD) GENERATOR CHANGE N/A 04/02/2014   Procedure: ICD GENERATOR CHANGE;  Surgeon: Evans Lance, MD;  Location: [facility] CATH LAB;  Service: Cardiovascular;  Laterality: N/A;  . INSERT / REPLACE / REMOVE PACEMAKER    . LEFT HEART CATHETERIZATION WITH CORONARY ANGIOGRAM N/A 04/02/2012   Procedure: LEFT HEART CATHETERIZATION WITH CORONARY ANGIOGRAM;  Surgeon: Hillary Bow, MD;  Location: [facility] CATH LAB;  Service: Cardiovascular;  Laterality: N/A;  . TUBAL LIGATION      Social History   Social History  . Marital status: Widowed    Spouse name: Alroy Dust  .  Number of children: 3  . Years of education: 43   Occupational History  . disabled    Social History Main Topics  . Smoking status: Current Every Day Smoker    Packs/day: 0.20    Years: 35.00    Types: Cigarettes  . Smokeless tobacco: Never Used     Comment: Quit since d/c from hospital.  . Alcohol use No  . Drug use: No  . Sexual activity: Not on file   Other Topics Concern  . Not on file   Social History Narrative   ** Merged History Encounter **       Married    Family History  Problem Relation Age of Onset  . Stroke Mother   . Seizures Father   .  Diabetes Sister   . Asthma Maternal Aunt     aunts  . Asthma Maternal Uncle     uncles  . Heart disease Father   . Heart disease Paternal Aunt     aunts  . Heart disease Paternal Uncle     uncles  . Heart disease Maternal Aunt     aunts  . Heart disease Maternal Uncle     uncles  . Heart disease Maternal Grandfather   . Colon cancer Neg Hx   . Colon polyps Neg Hx   . Esophageal cancer Neg Hx   . Kidney disease Neg Hx   . Gallbladder disease Neg Hx     ROS: no fevers or chills, productive cough, hemoptysis, dysphasia, odynophagia, melena, hematochezia, dysuria, hematuria, rash, seizure activity, orthopnea, PND, pedal edema, claudication. Remaining systems are negative.  Physical Exam: Well-developed well-nourished in no acute distress.  Skin is warm and dry.  HEENT is normal.  Neck is supple.  Chest is clear to auscultation with normal expansion.  Cardiovascular exam is regular rate and rhythm.  Abdominal exam nontender or distended. No masses palpated. Extremities show no edema. neuro grossly intact  ECG

## 2016-06-09 ENCOUNTER — Ambulatory Visit: Payer: Medicare Other | Admitting: Cardiology

## 2016-06-13 ENCOUNTER — Ambulatory Visit
Admission: RE | Admit: 2016-06-13 | Discharge: 2016-06-13 | Disposition: A | Discharge status: Home / Self Care | Payer: Medicare Other | Source: Ambulatory Visit | Attending: *Deleted | Admitting: *Deleted

## 2016-06-13 DIAGNOSIS — Z1231 Encounter for screening mammogram for malignant neoplasm of breast: Secondary | ICD-10-CM | POA: Diagnosis not present

## 2016-06-14 ENCOUNTER — Telehealth: Payer: Self-pay | Admitting: *Deleted

## 2016-06-14 ENCOUNTER — Encounter: Payer: Self-pay | Admitting: Cardiology

## 2016-06-14 NOTE — Progress Notes (Signed)
HPI: FU chronic combined systolic and diastolic CHF. She has a hx of NICM with a pror EF of 35-40%, HTN, HL, DM2, s/p AICD implantation. LHC 6/13: Mild calcification in the LM, o/w normal coronary arteries, EF 45%. Admitted 5/17 with encephalopathy and CHF; also with acute renal insuff; improved with therapy. Last echocardiogram in 5/17 showed EF 20-25 (decreased from previous), grade 1 diastolic dysfunction, mildly increased pulmonary pressure. Nuclear study 6/17 showed EF 33, anterolateral ischemia and septal scar/ischemia. Since I last saw her, She has some dyspnea on exertion but no orthopnea, PND, pedal edema, chest pain, palpitations or syncope.  Current Outpatient Prescriptions  Medication Sig Dispense Refill  . acetaminophen (TYLENOL) 325 MG tablet Take 2 tablets (650 mg total) by mouth every 6 (six) hours as needed. 100 tablet 2  . albuterol (PROVENTIL HFA;VENTOLIN HFA) 108 (90 Base) MCG/ACT inhaler Inhale 2 puffs into the lungs every 6 (six) hours as needed for wheezing or shortness of breath. 1 Inhaler 2  . albuterol (PROVENTIL) (2.5 MG/3ML) 0.083% nebulizer solution USE 1 VIAL VIA NEBULIZER EVERY 6 HOURS AS NEEDED FOR WHEEZING OR SHORTNESS OF BREATH 1125 mL 3  . amLODipine-olmesartan (AZOR) 10-40 MG tablet Take 1 tablet by mouth daily. 90 tablet 3  . aspirin EC 81 MG tablet Take 1 tablet (81 mg total) by mouth daily. 90 tablet 3  . atorvastatin (LIPITOR) 80 MG tablet Take 1 tablet (80 mg total) by mouth at bedtime. 90 tablet 3  . Fluticasone-Salmeterol (ADVAIR DISKUS) 250-50 MCG/DOSE AEPB Inhale 1 puff into the lungs 2 (two) times daily. 1 each 5  . furosemide (LASIX) 20 MG tablet Take 3 tablets (60 mg total) by mouth 2 (two) times daily. 180 tablet 2  . glipiZIDE (GLUCOTROL) 5 MG tablet Take 1 tablet (5 mg total) by mouth 2 (two) times daily before a meal. 60 tablet 2  . glucose blood (ONE TOUCH ULTRA TEST) test strip Use as instructed by doctor up to 3x daily,dx code 250.02  insulin requiring 100 each 5  . insulin glargine (LANTUS) 100 UNIT/ML injection Inject 35 Units into the skin at bedtime.    . insulin lispro (HUMALOG KWIKPEN) 100 UNIT/ML KiwkPen Take 10 units with breakfast, 10 units with lunch, and 5 units with dinner. 15 mL 3  . Insulin Syringe-Needle U-100 31G X 5/16" 1 ML MISC Use to give Lantus Insulin daily. E11.22. 100 each 11  . metFORMIN (GLUCOPHAGE-XR) 500 MG 24 hr tablet Take 4 tablets (2,000 mg total) by mouth daily with breakfast. 360 tablet 3  . metoprolol succinate (TOPROL-XL) 50 MG 24 hr tablet Take 1 tablet (50 mg total) by mouth daily. 90 tablet 3  . nitroGLYCERIN (NITROSTAT) 0.4 MG SL tablet Place 0.4 mg under the tongue every 5 (five) minutes as needed for chest pain. Reported on 03/18/2016    . omeprazole (PRILOSEC) 20 MG capsule Take 1 capsule (20 mg total) by mouth 2 (two) times daily. 90 capsule 1  . tiotropium (SPIRIVA) 18 MCG inhalation capsule Place 1 capsule (18 mcg total) into inhaler and inhale daily. 30 capsule 11   No current facility-administered medications for this visit.      Past Medical History:  Diagnosis Date  . ATN (acute tubular necrosis) ([place]) 07/15/2014  . Automatic implantable cardioverter-defibrillator in situ   . Cardiac defibrillator in situ    Atlas II VR (SJM) implanted by Dr Lovena Le  . CHF (congestive heart failure) ([place])   . Chronic combined systolic and  diastolic heart failure (HCC)    a. EF 35-40% in past;  b. Echo 7/13:  EF 45-50%, Gr 2 diast dysfn, mild AI, mild MAC, trivial MR, mild LAE, PASP 47.  [name] Chronic ulcer of leg ([place])    04-09-15 resolved-not a problem.  . Colon polyps 04/12/2013   Rectosigmoid polyp  . COPD (chronic obstructive pulmonary disease) (HCC)    O2 at night  . Depression   . Diabetes mellitus   . Eczema   . Elevated alkaline phosphatase level    GGT and 5'nucleotidase 8/13 normal  . History of oxygen administration    oxygen @ 2 l/m nasally bedtime 24/7  . HTN (hypertension)    . Hx of cardiac cath    a. [place] 2003 normal;  b. LHC 6/13:  Mild calcification in the LM, o/w normal coronary arteries, EF 45%.   . Hyperlipidemia   . Hyperthyroidism, subclinical   . Implantable cardioverter-defibrillator (ICD) generator end of life   . Lipoma   . NICM (nonischemic cardiomyopathy) ([place])   . Obesity   . Post menopausal syndrome   . Sleep apnea    pt denies 04/12/2013    Past Surgical History:  Procedure Laterality Date  . ABDOMINAL HYSTERECTOMY    . CARDIAC CATHETERIZATION    . CARDIAC DEFIBRILLATOR PLACEMENT  05/04/2007   SJM Atlas II VR ICD  . CARDIAC DEFIBRILLATOR PLACEMENT    . COLONOSCOPY N/A 04/12/2013   Procedure: COLONOSCOPY;  Surgeon: Beryle Beams, MD;  [facility];  Service: Endoscopy;  Laterality: N/A;  pt.has defibrilator  . COLONOSCOPY N/A 04/16/2015   Procedure: COLONOSCOPY;  Surgeon: [name], MD;  Location[facility];  Service: Endoscopy;  Laterality: N/A;  . ESOPHAGOGASTRODUODENOSCOPY N/A 04/16/2015   Procedure: ESOPHAGOGASTRODUODENOSCOPY (EGD);  Surgeon: [name], MD;  Location: [facility];  Service: Endoscopy;  Laterality: N/A;  . HERNIA REPAIR    . HYSTEROSCOPY    . IMPLANTABLE CARDIOVERTER DEFIBRILLATOR (ICD) GENERATOR CHANGE N/A 04/02/2014   Procedure: ICD GENERATOR CHANGE;  Surgeon: Evans Lance, MD;  Location: [facility] CATH LAB;  Service: Cardiovascular;  Laterality: N/A;  . INSERT / REPLACE / REMOVE PACEMAKER    . LEFT HEART CATHETERIZATION WITH CORONARY ANGIOGRAM N/A 04/02/2012   Procedure: LEFT HEART CATHETERIZATION WITH CORONARY ANGIOGRAM;  Surgeon: Hillary Bow, MD;  [facility] CATH LAB;  Service: Cardiovascular;  Laterality: N/A;  . TUBAL LIGATION      Social History   Social History  . Marital status: Widowed    Spouse name: Alroy Dust  . Number of children: 3  . Years of education: 30   Occupational History  . disabled    Social History Main Topics  . Smoking status: Current Every Day Smoker     Packs/day: 0.20    Years: 35.00    Types: Cigarettes  . Smokeless tobacco: Never Used     Comment: Quit since d/c from hospital.  . Alcohol use No  . Drug use: No  . Sexual activity: Not on file   Other Topics Concern  . Not on file   Social History Narrative   ** Merged History Encounter **       Married    Family History  Problem Relation Age of Onset  . Stroke Mother   . Seizures Father   . Heart disease Father   . Diabetes Sister   . Asthma Maternal Aunt     aunts  . Asthma Maternal Uncle  uncles  . Heart disease Paternal Aunt     aunts  . Heart disease Paternal Uncle     uncles  . Heart disease Maternal Aunt     aunts  . Heart disease Maternal Uncle     uncles  . Heart disease Maternal Grandfather   . Colon cancer Neg Hx   . Colon polyps Neg Hx   . Esophageal cancer Neg Hx   . Kidney disease Neg Hx   . Gallbladder disease Neg Hx     ROS: no fevers or chills, productive cough, hemoptysis, dysphasia, odynophagia, melena, hematochezia, dysuria, hematuria, rash, seizure activity, orthopnea, PND, pedal edema, claudication. Remaining systems are negative.  Physical Exam: Well-developed well-nourished in no acute distress.  Skin is warm and dry.  HEENT is normal.  Neck is supple.  Chest is clear to auscultation with normal expansion.  Cardiovascular exam is regular rate and rhythm.  Abdominal exam nontender or distended. No masses palpated. Extremities show no edema. neuro grossly intact  A/P  1 Combined systolic and diastolic congestive heart failure-patient is euvolemic on examination. Continue present dose of diuretics.  2 hypertension-blood pressure controlled. Continue present medications.  3 hyperlipidemia-continue statin.  4 ICD-followed by electrophysiology  5 nonischemic cardiomyopathy-continue ACE inhibitor and beta blocker. Note LV function had decreased on most recent echocardiogram and a nuclear study was ordered as an outpatient which  was high risk.There was anterolateral ischemia and scar/ischemia in the septum. We will plan to proceed with cardiac catheterization to exclude coronary disease. The risks and benefits including myocardial infarction, CVA and death were discussed and she agrees to proceed. Hold Lasix the morning of the procedure. Hold Glucophage for 48 hours after.  6 tobacco abuse-patient counseled on discontinuing.  Kirk Ruths, MD

## 2016-06-14 NOTE — Telephone Encounter (Signed)
[name] called pt on behalf of [name] as part of the geriatric task force. Calling to see how pt is doing in managing her DM and HTN.   Diabetes assessment: Pt states that she checks her blood sugars 3x daily. Blood sugars run in the 140s. Pt reported a low of 79-84. Denied any hypoglycemia symptoms and stated that it hasn't gotten below 70. Pt correctly reported current medications and how often/the dosages of each. Pt is compliant and takes them as ordered.   Blood pressure assessment: Pt was able to report all current BP medications and correct dosages/times to take the meds. Pt does not regularly check her BP at home. Barrier is financial and she's unable to afford a BP meter. Notified CSW. Referral to [facility] for possible assistance obtaining BP cuff. [name] stated that she watches her sodium intake, provided reinforcement and encouragement.   Medication access issues: [name] denied any barriers to medication access.   Conclusion: Pt is aware of scheduled appointment on 06/29/16 at 1:15pm. No further questions or concerns noted from [name] at this time.

## 2016-06-16 ENCOUNTER — Ambulatory Visit (INDEPENDENT_AMBULATORY_CARE_PROVIDER_SITE_OTHER): Payer: Medicare Other | Admitting: Cardiology

## 2016-06-16 ENCOUNTER — Other Ambulatory Visit: Payer: Self-pay | Admitting: *Deleted

## 2016-06-16 ENCOUNTER — Encounter: Payer: Self-pay | Admitting: Cardiology

## 2016-06-16 ENCOUNTER — Encounter: Payer: Self-pay | Admitting: *Deleted

## 2016-06-16 ENCOUNTER — Other Ambulatory Visit: Payer: Self-pay | Admitting: Cardiology

## 2016-06-16 VITALS — BP 124/80 | HR 88 | Ht 60.0 in | Wt 225.0 lb

## 2016-06-16 DIAGNOSIS — R9439 Abnormal result of other cardiovascular function study: Secondary | ICD-10-CM

## 2016-06-16 DIAGNOSIS — Z01818 Encounter for other preprocedural examination: Secondary | ICD-10-CM

## 2016-06-16 DIAGNOSIS — I428 Other cardiomyopathies: Secondary | ICD-10-CM

## 2016-06-16 DIAGNOSIS — F17200 Nicotine dependence, unspecified, uncomplicated: Secondary | ICD-10-CM | POA: Diagnosis not present

## 2016-06-16 DIAGNOSIS — I5042 Chronic combined systolic (congestive) and diastolic (congestive) heart failure: Secondary | ICD-10-CM

## 2016-06-16 DIAGNOSIS — Z95810 Presence of automatic (implantable) cardiac defibrillator: Secondary | ICD-10-CM

## 2016-06-16 DIAGNOSIS — I10 Essential (primary) hypertension: Secondary | ICD-10-CM

## 2016-06-16 DIAGNOSIS — I429 Cardiomyopathy, unspecified: Secondary | ICD-10-CM | POA: Diagnosis not present

## 2016-06-16 LAB — BASIC METABOLIC PANEL
BUN: 19 mg/dL (ref 7–25)
CO2: 32 mmol/L — ABNORMAL HIGH (ref 20–31)
Calcium: 9.1 mg/dL (ref 8.6–10.4)
Chloride: 100 mmol/L (ref 98–110)
Creat: 0.88 mg/dL (ref 0.50–0.99)
Glucose, Bld: 101 mg/dL — ABNORMAL HIGH (ref 65–99)
Potassium: 3.8 mmol/L (ref 3.5–5.3)
Sodium: 143 mmol/L (ref 135–146)

## 2016-06-16 LAB — CBC
HCT: 43.4 % (ref 35.0–45.0)
Hemoglobin: 13.9 g/dL (ref 11.7–15.5)
MCH: 26.1 pg — ABNORMAL LOW (ref 27.0–33.0)
MCHC: 32 g/dL (ref 32.0–36.0)
MCV: 81.4 fL (ref 80.0–100.0)
MPV: 11.5 fL (ref 7.5–12.5)
Platelets: 216 10*3/uL (ref 140–400)
RBC: 5.33 MIL/uL — ABNORMAL HIGH (ref 3.80–5.10)
RDW: 18.6 % — ABNORMAL HIGH (ref 11.0–15.0)
WBC: 8 10*3/uL (ref 3.8–10.8)

## 2016-06-16 LAB — PROTIME-INR
INR: 1
Prothrombin Time: 10.7 s (ref 9.0–11.5)

## 2016-06-16 NOTE — Patient Instructions (Signed)
Medication Instructions:   NO CHANGE  Labwork:  Your physician recommends that you return for lab work BY Monday 06-20-16  Testing/Procedures:  Your physician has requested that you have a cardiac catheterization. Cardiac catheterization is used to diagnose and/or treat various heart conditions. Doctors may recommend this procedure for a number of different reasons. The most common reason is to evaluate chest pain. Chest pain can be a symptom of coronary artery disease (CAD), and cardiac catheterization can show whether plaque is narrowing or blocking your heart's arteries. This procedure is also used to evaluate the valves, as well as measure the blood flow and oxygen levels in different parts of your heart. For further information please visit [email or web address]. Please follow instruction sheet, as given.

## 2016-06-21 ENCOUNTER — Encounter (HOSPITAL_COMMUNITY)
Admission: RE | Disposition: A | Discharge status: Home / Self Care | Payer: Self-pay | Source: Ambulatory Visit | Attending: Cardiovascular Disease

## 2016-06-21 ENCOUNTER — Encounter (HOSPITAL_COMMUNITY): Payer: Self-pay | Admitting: Cardiovascular Disease

## 2016-06-21 ENCOUNTER — Ambulatory Visit (HOSPITAL_COMMUNITY)
Admission: RE | Admit: 2016-06-21 | Discharge: 2016-06-21 | Disposition: A | Discharge status: Home / Self Care | Payer: Medicare Other | Source: Ambulatory Visit | Attending: Cardiovascular Disease | Admitting: Cardiovascular Disease

## 2016-06-21 DIAGNOSIS — E785 Hyperlipidemia, unspecified: Secondary | ICD-10-CM | POA: Diagnosis not present

## 2016-06-21 DIAGNOSIS — E119 Type 2 diabetes mellitus without complications: Secondary | ICD-10-CM | POA: Diagnosis not present

## 2016-06-21 DIAGNOSIS — Z794 Long term (current) use of insulin: Secondary | ICD-10-CM | POA: Diagnosis not present

## 2016-06-21 DIAGNOSIS — R9439 Abnormal result of other cardiovascular function study: Secondary | ICD-10-CM | POA: Insufficient documentation

## 2016-06-21 DIAGNOSIS — Z7984 Long term (current) use of oral hypoglycemic drugs: Secondary | ICD-10-CM | POA: Insufficient documentation

## 2016-06-21 DIAGNOSIS — I428 Other cardiomyopathies: Secondary | ICD-10-CM | POA: Diagnosis not present

## 2016-06-21 DIAGNOSIS — Z7982 Long term (current) use of aspirin: Secondary | ICD-10-CM | POA: Diagnosis not present

## 2016-06-21 DIAGNOSIS — E0590 Thyrotoxicosis, unspecified without thyrotoxic crisis or storm: Secondary | ICD-10-CM | POA: Diagnosis not present

## 2016-06-21 DIAGNOSIS — N959 Unspecified menopausal and perimenopausal disorder: Secondary | ICD-10-CM | POA: Diagnosis not present

## 2016-06-21 DIAGNOSIS — F329 Major depressive disorder, single episode, unspecified: Secondary | ICD-10-CM | POA: Diagnosis not present

## 2016-06-21 DIAGNOSIS — Z7951 Long term (current) use of inhaled steroids: Secondary | ICD-10-CM | POA: Diagnosis not present

## 2016-06-21 DIAGNOSIS — G4730 Sleep apnea, unspecified: Secondary | ICD-10-CM | POA: Insufficient documentation

## 2016-06-21 DIAGNOSIS — Z8249 Family history of ischemic heart disease and other diseases of the circulatory system: Secondary | ICD-10-CM | POA: Insufficient documentation

## 2016-06-21 DIAGNOSIS — J449 Chronic obstructive pulmonary disease, unspecified: Secondary | ICD-10-CM | POA: Insufficient documentation

## 2016-06-21 DIAGNOSIS — Z6841 Body Mass Index (BMI) 40.0 and over, adult: Secondary | ICD-10-CM | POA: Insufficient documentation

## 2016-06-21 DIAGNOSIS — Z823 Family history of stroke: Secondary | ICD-10-CM | POA: Insufficient documentation

## 2016-06-21 DIAGNOSIS — Z86010 Personal history of colonic polyps: Secondary | ICD-10-CM | POA: Diagnosis not present

## 2016-06-21 DIAGNOSIS — E669 Obesity, unspecified: Secondary | ICD-10-CM | POA: Insufficient documentation

## 2016-06-21 DIAGNOSIS — I5042 Chronic combined systolic (congestive) and diastolic (congestive) heart failure: Secondary | ICD-10-CM | POA: Insufficient documentation

## 2016-06-21 DIAGNOSIS — Z95810 Presence of automatic (implantable) cardiac defibrillator: Secondary | ICD-10-CM | POA: Diagnosis not present

## 2016-06-21 DIAGNOSIS — F17210 Nicotine dependence, cigarettes, uncomplicated: Secondary | ICD-10-CM | POA: Insufficient documentation

## 2016-06-21 DIAGNOSIS — R931 Abnormal findings on diagnostic imaging of heart and coronary circulation: Secondary | ICD-10-CM | POA: Diagnosis not present

## 2016-06-21 DIAGNOSIS — I110 Hypertensive heart disease with heart failure: Secondary | ICD-10-CM | POA: Insufficient documentation

## 2016-06-21 HISTORY — PX: CARDIAC CATHETERIZATION: SHX172

## 2016-06-21 LAB — GLUCOSE, CAPILLARY
Glucose-Capillary: 133 mg/dL — ABNORMAL HIGH (ref 65–99)
Glucose-Capillary: 153 mg/dL — ABNORMAL HIGH (ref 65–99)

## 2016-06-21 SURGERY — LEFT HEART CATH AND CORONARY ANGIOGRAPHY

## 2016-06-21 MED ORDER — NITROGLYCERIN 1 MG/10 ML FOR IR/CATH LAB
INTRA_ARTERIAL | Status: AC
  Filled 2016-06-21: qty 10

## 2016-06-21 MED ORDER — FENTANYL CITRATE (PF) 100 MCG/2ML IJ SOLN
INTRAMUSCULAR | Status: AC
  Filled 2016-06-21: qty 2

## 2016-06-21 MED ORDER — SODIUM CHLORIDE 0.9 % IV SOLN: 250.0000 mL | INTRAVENOUS | Status: DC | PRN

## 2016-06-21 MED ORDER — MIDAZOLAM HCL 2 MG/2ML IJ SOLN
INTRAMUSCULAR | Status: DC | PRN
  Administered 2016-06-21: 2 mg via INTRAVENOUS

## 2016-06-21 MED ORDER — LIDOCAINE HCL (PF) 1 % IJ SOLN
INTRAMUSCULAR | Status: DC | PRN
  Administered 2016-06-21: 2 mL via INTRADERMAL

## 2016-06-21 MED ORDER — SODIUM CHLORIDE 0.9% FLUSH
3.0000 mL | INTRAVENOUS | Status: DC | PRN
Start: 2016-06-21 — End: 2016-06-21

## 2016-06-21 MED ORDER — SODIUM CHLORIDE 0.9 % WEIGHT BASED INFUSION: 1.0000 mL/kg/h | INTRAVENOUS | Status: DC

## 2016-06-21 MED ORDER — VERAPAMIL HCL 2.5 MG/ML IV SOLN
INTRAVENOUS | Status: DC | PRN
  Administered 2016-06-21: 10 mL via INTRA_ARTERIAL

## 2016-06-21 MED ORDER — SODIUM CHLORIDE 0.9 % WEIGHT BASED INFUSION
3.0000 mL/kg/h | INTRAVENOUS | Status: DC
  Administered 2016-06-21: 3 mL/kg/h via INTRAVENOUS

## 2016-06-21 MED ORDER — IOPAMIDOL (ISOVUE-370) INJECTION 76%
INTRAVENOUS | Status: DC | PRN
  Administered 2016-06-21: 40 mL via INTRA_ARTERIAL

## 2016-06-21 MED ORDER — FENTANYL CITRATE (PF) 100 MCG/2ML IJ SOLN
INTRAMUSCULAR | Status: DC | PRN
  Administered 2016-06-21: 25 ug via INTRAVENOUS

## 2016-06-21 MED ORDER — HEPARIN SODIUM (PORCINE) 1000 UNIT/ML IJ SOLN
INTRAMUSCULAR | Status: AC
  Filled 2016-06-21: qty 1

## 2016-06-21 MED ORDER — VERAPAMIL HCL 2.5 MG/ML IV SOLN
INTRAVENOUS | Status: AC
  Filled 2016-06-21: qty 2

## 2016-06-21 MED ORDER — SODIUM CHLORIDE 0.9% FLUSH: 3.0000 mL | Freq: Two times a day (BID) | INTRAVENOUS | Status: DC

## 2016-06-21 MED ORDER — ACETAMINOPHEN 325 MG PO TABS: 650.0000 mg | ORAL_TABLET | ORAL | Status: DC | PRN

## 2016-06-21 MED ORDER — ONDANSETRON HCL 4 MG/2ML IJ SOLN: 4.0000 mg | Freq: Four times a day (QID) | INTRAMUSCULAR | Status: DC | PRN

## 2016-06-21 MED ORDER — IOPAMIDOL (ISOVUE-370) INJECTION 76%
INTRAVENOUS | Status: AC
  Filled 2016-06-21: qty 100

## 2016-06-21 MED ORDER — SODIUM CHLORIDE 0.9% FLUSH: 3.0000 mL | INTRAVENOUS | Status: DC | PRN

## 2016-06-21 MED ORDER — LIDOCAINE HCL (PF) 1 % IJ SOLN
INTRAMUSCULAR | Status: AC
  Filled 2016-06-21: qty 30

## 2016-06-21 MED ORDER — HEPARIN (PORCINE) IN NACL 2-0.9 UNIT/ML-% IJ SOLN
INTRAMUSCULAR | Status: DC | PRN
  Administered 2016-06-21: 1500 mL

## 2016-06-21 MED ORDER — HEPARIN (PORCINE) IN NACL 2-0.9 UNIT/ML-% IJ SOLN
INTRAMUSCULAR | Status: AC
  Filled 2016-06-21: qty 1000

## 2016-06-21 MED ORDER — ASPIRIN 81 MG PO CHEW: 81.0000 mg | CHEWABLE_TABLET | ORAL | Status: DC

## 2016-06-21 MED ORDER — HEPARIN SODIUM (PORCINE) 1000 UNIT/ML IJ SOLN
INTRAMUSCULAR | Status: DC | PRN
  Administered 2016-06-21: 5000 [IU] via INTRAVENOUS

## 2016-06-21 MED ORDER — MIDAZOLAM HCL 2 MG/2ML IJ SOLN
INTRAMUSCULAR | Status: AC
  Filled 2016-06-21: qty 2

## 2016-06-21 MED ORDER — SODIUM CHLORIDE 0.9 % IV SOLN: INTRAVENOUS | Status: DC

## 2016-06-21 SURGICAL SUPPLY — 10 items
CATH INFINITI 5 FR JL3.5 (CATHETERS) ×1 IMPLANT
CATH INFINITI JR4 5F (CATHETERS) ×1 IMPLANT
DEVICE RAD COMP TR BAND LRG (VASCULAR PRODUCTS) ×1 IMPLANT
GLIDESHEATH SLEND SS 6F .021 (SHEATH) ×1 IMPLANT
KIT HEART LEFT (KITS) ×2 IMPLANT
PACK CARDIAC CATHETERIZATION (CUSTOM PROCEDURE TRAY) ×2 IMPLANT
TRANSDUCER W/STOPCOCK (MISCELLANEOUS) ×2 IMPLANT
TUBING CIL FLEX 10 FLL-RA (TUBING) ×2 IMPLANT
WIRE HI TORQ VERSACORE-J 145CM (WIRE) ×1 IMPLANT
WIRE SAFE-T 1.5MM-J .035X260CM (WIRE) ×1 IMPLANT

## 2016-06-21 NOTE — Interval H&P Note (Signed)
Cath Lab Visit (complete for each Cath Lab visit)  Clinical Evaluation Leading to the Procedure:   ACS: No.  Non-ACS:    Anginal Classification: CCS II  Anti-ischemic medical therapy: Minimal Therapy (1 class of medications)  Non-Invasive Test Results: High-risk stress test findings: cardiac mortality >3%/year  Prior CABG: No previous CABG      History and Physical Interval Note:  06/21/2016 7:46 AM  [name]  has presented today for surgery, with the diagnosis of abnormal nuc  The various methods of treatment have been discussed with the patient and family. After consideration of risks, benefits and other options for treatment, the patient has consented to  Procedure(s): Left Heart Cath and Coronary Angiography (N/A) as a surgical intervention .  The patient's history has been reviewed, patient examined, no change in status, stable for surgery.  I have reviewed the patient's chart and labs.  Questions were answered to the patient's satisfaction.     Sherren Mocha

## 2016-06-21 NOTE — H&P (View-Only) (Signed)
HPI: FU chronic combined systolic and diastolic CHF. She has a hx of NICM with a pror EF of 35-40%, HTN, HL, DM2, s/p AICD implantation. LHC 6/13: Mild calcification in the LM, o/w normal coronary arteries, EF 45%. Admitted 5/17 with encephalopathy and CHF; also with acute renal insuff; improved with therapy. Last echocardiogram in 5/17 showed EF 20-25 (decreased from previous), grade 1 diastolic dysfunction, mildly increased pulmonary pressure. Nuclear study 6/17 showed EF 33, anterolateral ischemia and septal scar/ischemia. Since I last saw her, She has some dyspnea on exertion but no orthopnea, PND, pedal edema, chest pain, palpitations or syncope.  Current Outpatient Prescriptions  Medication Sig Dispense Refill  . acetaminophen (TYLENOL) 325 MG tablet Take 2 tablets (650 mg total) by mouth every 6 (six) hours as needed. 100 tablet 2  . albuterol (PROVENTIL HFA;VENTOLIN HFA) 108 (90 Base) MCG/ACT inhaler Inhale 2 puffs into the lungs every 6 (six) hours as needed for wheezing or shortness of breath. 1 Inhaler 2  . albuterol (PROVENTIL) (2.5 MG/3ML) 0.083% nebulizer solution USE 1 VIAL VIA NEBULIZER EVERY 6 HOURS AS NEEDED FOR WHEEZING OR SHORTNESS OF BREATH 1125 mL 3  . amLODipine-olmesartan (AZOR) 10-40 MG tablet Take 1 tablet by mouth daily. 90 tablet 3  . aspirin EC 81 MG tablet Take 1 tablet (81 mg total) by mouth daily. 90 tablet 3  . atorvastatin (LIPITOR) 80 MG tablet Take 1 tablet (80 mg total) by mouth at bedtime. 90 tablet 3  . Fluticasone-Salmeterol (ADVAIR DISKUS) 250-50 MCG/DOSE AEPB Inhale 1 puff into the lungs 2 (two) times daily. 1 each 5  . furosemide (LASIX) 20 MG tablet Take 3 tablets (60 mg total) by mouth 2 (two) times daily. 180 tablet 2  . glipiZIDE (GLUCOTROL) 5 MG tablet Take 1 tablet (5 mg total) by mouth 2 (two) times daily before a meal. 60 tablet 2  . glucose blood (ONE TOUCH ULTRA TEST) test strip Use as instructed by doctor up to 3x daily,dx code 250.02  insulin requiring 100 each 5  . insulin glargine (LANTUS) 100 UNIT/ML injection Inject 35 Units into the skin at bedtime.    . insulin lispro (HUMALOG KWIKPEN) 100 UNIT/ML KiwkPen Take 10 units with breakfast, 10 units with lunch, and 5 units with dinner. 15 mL 3  . Insulin Syringe-Needle U-100 31G X 5/16" 1 ML MISC Use to give Lantus Insulin daily. E11.22. 100 each 11  . metFORMIN (GLUCOPHAGE-XR) 500 MG 24 hr tablet Take 4 tablets (2,000 mg total) by mouth daily with breakfast. 360 tablet 3  . metoprolol succinate (TOPROL-XL) 50 MG 24 hr tablet Take 1 tablet (50 mg total) by mouth daily. 90 tablet 3  . nitroGLYCERIN (NITROSTAT) 0.4 MG SL tablet Place 0.4 mg under the tongue every 5 (five) minutes as needed for chest pain. Reported on 03/18/2016    . omeprazole (PRILOSEC) 20 MG capsule Take 1 capsule (20 mg total) by mouth 2 (two) times daily. 90 capsule 1  . tiotropium (SPIRIVA) 18 MCG inhalation capsule Place 1 capsule (18 mcg total) into inhaler and inhale daily. 30 capsule 11   No current facility-administered medications for this visit.      Past Medical History:  Diagnosis Date  . ATN (acute tubular necrosis) ([place]) 07/15/2014  . Automatic implantable cardioverter-defibrillator in situ   . Cardiac defibrillator in situ    Atlas II VR (SJM) implanted by Dr Lovena Le  . CHF (congestive heart failure) ([place])   . Chronic combined systolic and  diastolic heart failure (HCC)    a. EF 35-40% in past;  b. Echo 7/13:  EF 45-50%, Gr 2 diast dysfn, mild AI, mild MAC, trivial MR, mild LAE, PASP 47.  [name] Chronic ulcer of leg ([place])    04-09-15 resolved-not a problem.  . Colon polyps 04/12/2013   Rectosigmoid polyp  . COPD (chronic obstructive pulmonary disease) (HCC)    O2 at night  . Depression   . Diabetes mellitus   . Eczema   . Elevated alkaline phosphatase level    GGT and 5'nucleotidase 8/13 normal  . History of oxygen administration    oxygen @ 2 l/m nasally bedtime 24/7  . HTN (hypertension)    . Hx of cardiac cath    a. [place] 2003 normal;  b. LHC 6/13:  Mild calcification in the LM, o/w normal coronary arteries, EF 45%.   . Hyperlipidemia   . Hyperthyroidism, subclinical   . Implantable cardioverter-defibrillator (ICD) generator end of life   . Lipoma   . NICM (nonischemic cardiomyopathy) ([place])   . Obesity   . Post menopausal syndrome   . Sleep apnea    pt denies 04/12/2013    Past Surgical History:  Procedure Laterality Date  . ABDOMINAL HYSTERECTOMY    . CARDIAC CATHETERIZATION    . CARDIAC DEFIBRILLATOR PLACEMENT  05/04/2007   SJM Atlas II VR ICD  . CARDIAC DEFIBRILLATOR PLACEMENT    . COLONOSCOPY N/A 04/12/2013   Procedure: COLONOSCOPY;  Surgeon: Beryle Beams, MD;  Location: [facility] ENDOSCOPY;  Service: Endoscopy;  Laterality: N/A;  pt.has defibrilator  . COLONOSCOPY N/A 04/16/2015   Procedure: COLONOSCOPY;  Surgeon: [name], MD;  Location: [facility] ENDOSCOPY;  Service: Endoscopy;  Laterality: N/A;  . ESOPHAGOGASTRODUODENOSCOPY N/A 04/16/2015   Procedure: ESOPHAGOGASTRODUODENOSCOPY (EGD);  Surgeon: [name], MD;  Location: [facility];  Service: Endoscopy;  Laterality: N/A;  . HERNIA REPAIR    . HYSTEROSCOPY    . IMPLANTABLE CARDIOVERTER DEFIBRILLATOR (ICD) GENERATOR CHANGE N/A 04/02/2014   Procedure: ICD GENERATOR CHANGE;  Surgeon: Evans Lance, MD;  Location: [facility] CATH LAB;  Service: Cardiovascular;  Laterality: N/A;  . INSERT / REPLACE / REMOVE PACEMAKER    . LEFT HEART CATHETERIZATION WITH CORONARY ANGIOGRAM N/A 04/02/2012   Procedure: LEFT HEART CATHETERIZATION WITH CORONARY ANGIOGRAM;  Surgeon: Hillary Bow, MD;  Location: [facility] CATH LAB;  Service: Cardiovascular;  Laterality: N/A;  . TUBAL LIGATION      Social History   Social History  . Marital status: Widowed    Spouse name: Alroy Dust  . Number of children: 3  . Years of education: 46   Occupational History  . disabled    Social History Main Topics  . Smoking status: Current Every Day Smoker     Packs/day: 0.20    Years: 35.00    Types: Cigarettes  . Smokeless tobacco: Never Used     Comment: Quit since d/c from hospital.  . Alcohol use No  . Drug use: No  . Sexual activity: Not on file   Other Topics Concern  . Not on file   Social History Narrative   ** Merged History Encounter **       Married    Family History  Problem Relation Age of Onset  . Stroke Mother   . Seizures Father   . Heart disease Father   . Diabetes Sister   . Asthma Maternal Aunt     aunts  . Asthma Maternal Uncle  uncles  . Heart disease Paternal Aunt     aunts  . Heart disease Paternal Uncle     uncles  . Heart disease Maternal Aunt     aunts  . Heart disease Maternal Uncle     uncles  . Heart disease Maternal Grandfather   . Colon cancer Neg Hx   . Colon polyps Neg Hx   . Esophageal cancer Neg Hx   . Kidney disease Neg Hx   . Gallbladder disease Neg Hx     ROS: no fevers or chills, productive cough, hemoptysis, dysphasia, odynophagia, melena, hematochezia, dysuria, hematuria, rash, seizure activity, orthopnea, PND, pedal edema, claudication. Remaining systems are negative.  Physical Exam: Well-developed well-nourished in no acute distress.  Skin is warm and dry.  HEENT is normal.  Neck is supple.  Chest is clear to auscultation with normal expansion.  Cardiovascular exam is regular rate and rhythm.  Abdominal exam nontender or distended. No masses palpated. Extremities show no edema. neuro grossly intact  A/P  1 Combined systolic and diastolic congestive heart failure-patient is euvolemic on examination. Continue present dose of diuretics.  2 hypertension-blood pressure controlled. Continue present medications.  3 hyperlipidemia-continue statin.  4 ICD-followed by electrophysiology  5 nonischemic cardiomyopathy-continue ACE inhibitor and beta blocker. Note LV function had decreased on most recent echocardiogram and a nuclear study was ordered as an outpatient which  was high risk.There was anterolateral ischemia and scar/ischemia in the septum. We will plan to proceed with cardiac catheterization to exclude coronary disease. The risks and benefits including myocardial infarction, CVA and death were discussed and she agrees to proceed. Hold Lasix the morning of the procedure. Hold Glucophage for 48 hours after.  6 tobacco abuse-patient counseled on discontinuing.  Kirk Ruths, MD

## 2016-06-21 NOTE — Discharge Instructions (Signed)
Hold Metformin until Friday   Radial Site Care Refer to this sheet in the next few weeks. These instructions provide you with information about caring for yourself after your procedure. Your health care provider may also give you more specific instructions. Your treatment has been planned according to current medical practices, but problems sometimes occur. Call your health care provider if you have any problems or questions after your procedure. WHAT TO EXPECT AFTER THE PROCEDURE After your procedure, it is typical to have the following:  Bruising at the radial site that usually fades within 1-2 weeks.  Blood collecting in the tissue (hematoma) that may be painful to the touch. It should usually decrease in size and tenderness within 1-2 weeks. HOME CARE INSTRUCTIONS  Take medicines only as directed by your health care provider.  You may shower 24-48 hours after the procedure or as directed by your health care provider. Remove the bandage (dressing) and gently wash the site with plain soap and water. Pat the area dry with a clean towel. Do not rub the site, because this may cause bleeding.  Do not take baths, swim, or use a hot tub until your health care provider approves.  Check your insertion site every day for redness, swelling, or drainage.  Do not apply powder or lotion to the site.  Do not flex or bend the affected arm for 24 hours or as directed by your health care provider.  Do not push or pull heavy objects with the affected arm for 24 hours or as directed by your health care provider.  Do not lift over 10 lb (4.5 kg) for 5 days after your procedure or as directed by your health care provider.  Ask your health care provider when it is okay to:  Return to work or school.  Resume usual physical activities or sports.  Resume sexual activity.  Do not drive home if you are discharged the same day as the procedure. Have someone else drive you.  You may drive 24 hours after  the procedure unless otherwise instructed by your health care provider.  Do not operate machinery or power tools for 24 hours after the procedure.  If your procedure was done as an outpatient procedure, which means that you went home the same day as your procedure, a responsible adult should be with you for the first 24 hours after you arrive home.  Keep all follow-up visits as directed by your health care provider. This is important. SEEK MEDICAL CARE IF:  You have a fever.  You have chills.  You have increased bleeding from the radial site. Hold pressure on the site. SEEK IMMEDIATE MEDICAL CARE IF:  You have unusual pain at the radial site.  You have redness, warmth, or swelling at the radial site.  You have drainage (other than a small amount of blood on the dressing) from the radial site.  The radial site is bleeding, and the bleeding does not stop after 30 minutes of holding steady pressure on the site.  Your arm or hand becomes pale, cool, tingly, or numb.   This information is not intended to replace advice given to you by your health care provider. Make sure you discuss any questions you have with your health care provider.   Document Released: 10/22/2010 Document Revised: 10/10/2014 Document Reviewed: 04/07/2014 Elsevier Interactive Patient Education [employer].

## 2016-06-24 IMAGING — CR DG CHEST 1V PORT
1 series · 1 of 1 positions shown · non-contrast
Comparison: Portable chest x-ray February 10, 2016

CLINICAL DATA: Altered mental status, history of COPD, diabetes,
cardiomyopathy, current smoker.

EXAM:
PORTABLE CHEST 1 VIEW

[AP]
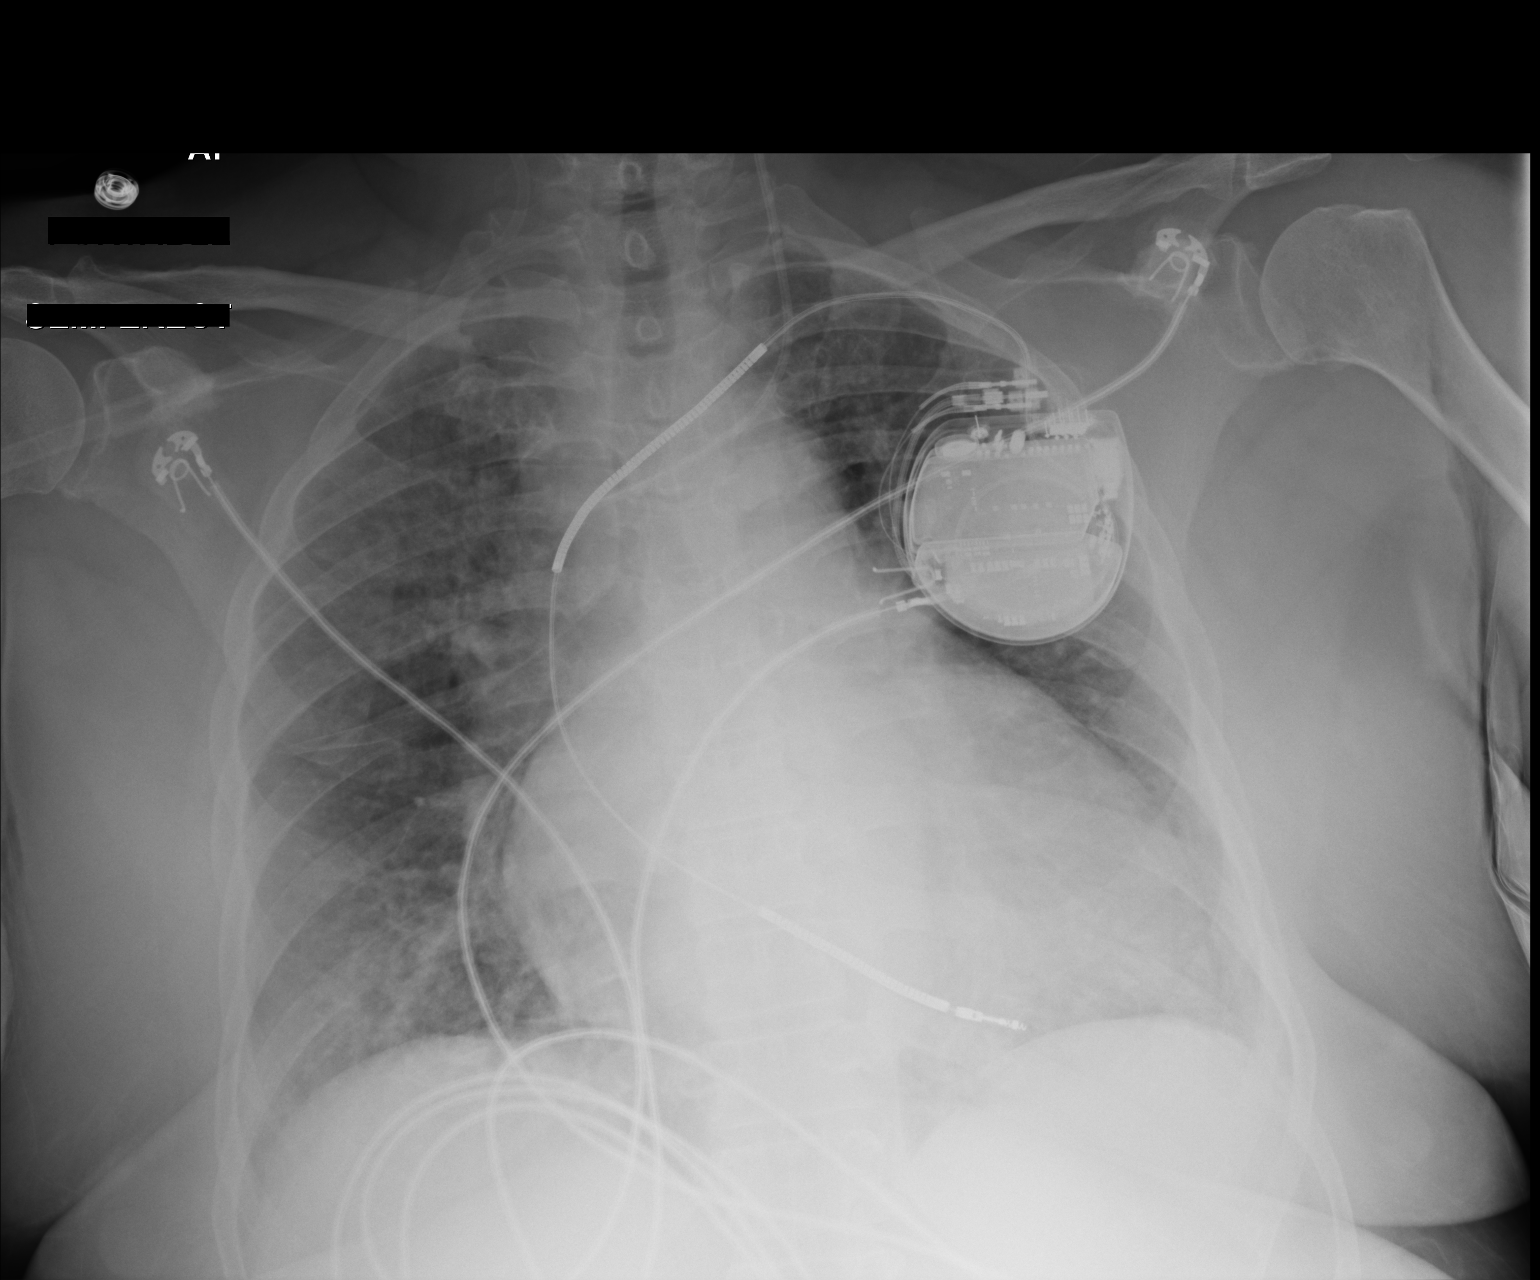

[1 of 1 positions shown; findings below may reference images not displayed]

FINDINGS: The lungs remain well-expanded. The interstitial markings remain
mildly increased. The cardiac silhouette remains enlarged and the
pulmonary vascularity mildly engorged. The permanent pacemaker
defibrillator is in stable position. The left internal jugular
venous catheter tip projects over the proximal SVC.
IMPRESSION: CHF with mild interstitial edema superimposed upon COPD. No evidence
of aspiration. There has not been significant interval change since
the previous study.

## 2016-06-24 IMAGING — NM NM PULMONARY VENT & PERF
15 series · 15 of 15 positions shown · non-contrast
Comparison: Chest x-ray from the previous day

CLINICAL DATA: Hypoxia

EXAM:
NUCLEAR MEDICINE VENTILATION - PERFUSION LUNG SCAN
TECHNIQUE: Ventilation images were obtained in multiple projections using
inhaled aerosol Hc-OOm DTPA. Perfusion images were obtained in
multiple projections after intravenous injection of Hc-OOm MAA.
RADIOPHARMACEUTICALS:  31.7 mCi Bechnetium-VVm DTPA aerosol
inhalation and 4.19 mCi Bechnetium-VVm MAA IV

[Series 1: ant/post vent · 4.14mm/px · 1 of 1 slices shown]
[im 1/1]
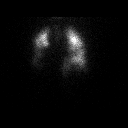

[Series 2: lao/rpo vent · 4.14mm/px · 1 of 1 slices shown (1 of 2)]
[im 1/1]
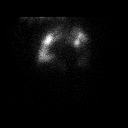

[Series 2: lao/rpo vent · 4.14mm/px · 1 of 1 slices shown (2 of 2)]
[im 1/1]
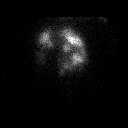

[Series 3: lpo/rao vent · 4.14mm/px · 1 of 1 slices shown (1 of 2)]
[im 1/1]
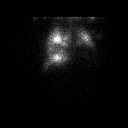

[Series 3: lpo/rao vent · 4.14mm/px · 1 of 1 slices shown (2 of 2)]
[im 1/1]
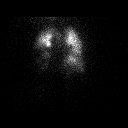

[Series 4: lt lat/rt lat vent · 4.14mm/px · 1 of 1 slices shown (1 of 2)]
[im 1/1]
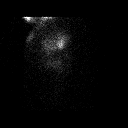

[Series 4: lt lat/rt lat vent · 4.14mm/px · 1 of 1 slices shown (2 of 2)]
[im 1/1]
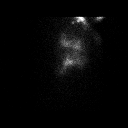

[Series 5: lt lat/rt lat perf · 4.14mm/px · 1 of 1 slices shown (1 of 2)]
[im 1/1]
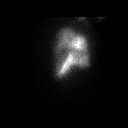

[Series 5: lt lat/rt lat perf · 4.14mm/px · 1 of 1 slices shown (2 of 2)]
[im 1/1]
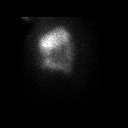

[Series 6: lpo/rao perf · 4.14mm/px · 1 of 1 slices shown (1 of 2)]
[im 1/1]
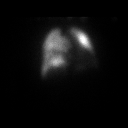

[Series 6: lpo/rao perf · 4.14mm/px · 1 of 1 slices shown (2 of 2)]
[im 1/1]
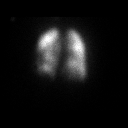

[Series 7: ant/post perf · 4.14mm/px · 1 of 1 slices shown (1 of 2)]
[im 1/1]
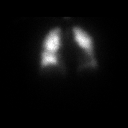

[Series 7: ant/post perf · 4.14mm/px · 1 of 1 slices shown (2 of 2)]
[im 1/1]
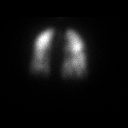

[Series 8: lao/rpo perf · 4.14mm/px · 1 of 1 slices shown (1 of 2)]
[im 1/1]
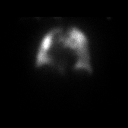

[Series 8: lao/rpo perf · 4.14mm/px · 1 of 1 slices shown (2 of 2)]
[im 1/1]
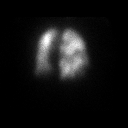

[15 of 15 positions shown; findings below may reference images not displayed]

FINDINGS: Ventilation: There is adequate uptake on the ventilation images.
Large cardiac shadow is noted. Some mild decreased ventilation is
noted in the right middle lobe.

Perfusion: Perfusion images demonstrate adequate uptake throughout
both lungs with the exception of a area of decreased perfusion in
the right middle lobe. This matches the ventilation images and
likely related to focal atelectasis.
IMPRESSION: No ventilation-perfusion mismatch to suggest pulmonary embolism is
noted. There is a matched defect in the right middle lobe likely
related to atelectasis.

## 2016-06-29 ENCOUNTER — Encounter: Payer: Self-pay | Admitting: Internal Medicine

## 2016-06-29 ENCOUNTER — Ambulatory Visit (INDEPENDENT_AMBULATORY_CARE_PROVIDER_SITE_OTHER): Payer: Medicare Other | Admitting: Internal Medicine

## 2016-06-29 DIAGNOSIS — E1165 Type 2 diabetes mellitus with hyperglycemia: Secondary | ICD-10-CM

## 2016-06-29 DIAGNOSIS — E1122 Type 2 diabetes mellitus with diabetic chronic kidney disease: Secondary | ICD-10-CM | POA: Diagnosis not present

## 2016-06-29 DIAGNOSIS — Z79899 Other long term (current) drug therapy: Secondary | ICD-10-CM

## 2016-06-29 DIAGNOSIS — J449 Chronic obstructive pulmonary disease, unspecified: Secondary | ICD-10-CM | POA: Diagnosis not present

## 2016-06-29 DIAGNOSIS — Z95810 Presence of automatic (implantable) cardiac defibrillator: Secondary | ICD-10-CM

## 2016-06-29 DIAGNOSIS — I5042 Chronic combined systolic (congestive) and diastolic (congestive) heart failure: Secondary | ICD-10-CM

## 2016-06-29 DIAGNOSIS — N182 Chronic kidney disease, stage 2 (mild): Secondary | ICD-10-CM | POA: Diagnosis not present

## 2016-06-29 DIAGNOSIS — F17210 Nicotine dependence, cigarettes, uncomplicated: Secondary | ICD-10-CM

## 2016-06-29 DIAGNOSIS — Z7951 Long term (current) use of inhaled steroids: Secondary | ICD-10-CM

## 2016-06-29 DIAGNOSIS — Z794 Long term (current) use of insulin: Secondary | ICD-10-CM

## 2016-06-29 LAB — GLUCOSE, CAPILLARY: Glucose-Capillary: 192 mg/dL — ABNORMAL HIGH (ref 65–99)

## 2016-06-29 NOTE — Progress Notes (Signed)
    CC: Follow up on T2DM  HPI: [name] is a 65 y.o. female with PMHx of T2DM, Chronic Combined CHF, HTN, NICM, COPD, AICD placement, HLD, COPD who presents to the clinic for follow up for T2DM. Please see problem oriented assessment and plan for more information.  Patient denies chest pain or increased shortness of breath. She is eating and drinking well.   Past Medical History:  Diagnosis Date  . ATN (acute tubular necrosis) ([place]) 07/15/2014  . Automatic implantable cardioverter-defibrillator in situ   . Cardiac defibrillator in situ    Atlas II VR (SJM) implanted by Dr Lovena Le  . CHF (congestive heart failure) ([place])   . Chronic combined systolic and diastolic heart failure (HCC)    a. EF 35-40% in past;  b. Echo 7/13:  EF 45-50%, Gr 2 diast dysfn, mild AI, mild MAC, trivial MR, mild LAE, PASP 47.  [name] Chronic ulcer of leg ([place])    04-09-15 resolved-not a problem.  . Colon polyps 04/12/2013   Rectosigmoid polyp  . COPD (chronic obstructive pulmonary disease) (HCC)    O2 at night  . Depression   . Diabetes mellitus   . Eczema   . Elevated alkaline phosphatase level    GGT and 5'nucleotidase 8/13 normal  . History of oxygen administration    oxygen @ 2 l/m nasally bedtime 24/7  . HTN (hypertension)   . Hx of cardiac cath    a. [place] 2003 normal;  b. LHC 6/13:  Mild calcification in the LM, o/w normal coronary arteries, EF 45%.   . Hyperlipidemia   . Hyperthyroidism, subclinical   . Implantable cardioverter-defibrillator (ICD) generator end of life   . Lipoma   . NICM (nonischemic cardiomyopathy) ([place])   . Obesity   . Post menopausal syndrome   . Sleep apnea    pt denies 04/12/2013    Review of Systems: Please see pertinent ROS reviewed in HPI and problem based charting.   Physical Exam: Vitals:   06/29/16 1326  BP: 134/71  Pulse: 88  Temp: 97.8 F (36.6 C)  TempSrc: Oral  SpO2: 100%  Weight: 228 lb 8 oz (103.6 kg)  Height: 5' (1.524 m)   General: Vital  signs reviewed.  Patient is in no acute distress and cooperative with exam.  Neck: Supple, trachea midline, no carotid bruit present.  Cardiovascular: RRR Pulmonary/Chest: Clear to auscultation bilaterally, no wheezes, rales, or rhonchi. Abdominal: Soft, non-tender, non-distended, BS +  Extremities: 1+ pitting lower extremity edema bilaterally to ankles Skin: Warm, dry and intact. No rashes or erythema. Psychiatric: Normal mood and affect. speech and behavior is normal. Cognition and memory are normal.   Assessment & Plan:  See encounters tab for problem based medical decision making. Patient discussed with Dr. Daryll Drown

## 2016-06-29 NOTE — Patient Instructions (Signed)
Ms. Anyelin, Mogle are doing great! Keep taking all of your medications as prescribed. If you check your sugar is less than 120 before a meal, take HALF of the prescribed insulin dose. Otherwise, continue to take Insulin 10 units before breakfast, 10 units before lunch, and 5 units before dinner. Please follow up in 2-3 months.   Sincerely, Dr. Quay Burow

## 2016-07-01 NOTE — Assessment & Plan Note (Signed)
Lab Results  Component Value Date   HGBA1C 8.7 06/01/2016   HGBA1C 13.7 02/10/2016   HGBA1C 12.8 11/11/2015     Assessment: Diabetes control:  Uncontrolled Progress toward A1C goal:   Improving Comments: Last visit, Lantus was increased to 37 units QHS, Humalog to 10 units QAM, 10 units before breakfast, 5 units before dinner and glipizide 5 mg BID was added. Patient reports increased control. Her CBGs fluctuate between 100-low 200s. No hypoglycemic events.   Plan: Medications:  continue current medications Instruction/counseling given: discussed the need for weight loss Educational resources provided:   Self management tools provided: copy of home glucose meter download Other plans: Follow up in 2 months for A1c.

## 2016-07-01 NOTE — Assessment & Plan Note (Addendum)
Patient denies chest pain or increased shortness of breath. She has been compliant with her medications of metoprolol 50 mg QD, lasix 60 mg BID, amlodipine-olmesartan 10-40 mg QD. She was recently seen by Cardiology and continued her current medication regimen. She underwent LHC due to an abnormal myoview test and decreased EF. LHC showed normal coronary arteries on 06/21/16. On exam, her lungs are clear, but she does have increased edema to her ankles which she reports as chronic.   Plan: -Continue metoprolol 50 mg QD -Continue lasix 60 mg BID -Continue amlodipine-olmesartan 10-40 mg QD -Daily weights -Continue atorvastatin 80 mg QD

## 2016-07-01 NOTE — Assessment & Plan Note (Signed)
Patient good respiratory status without increased shortness of breath. She has been compliant with her new inhaler regimen.   Plan: -Continue albuterol prn -Continue Advair -Continue Spiriva

## 2016-07-12 NOTE — Progress Notes (Signed)
Internal Medicine Clinic Attending  Case discussed with Dr. Burns soon after the resident saw the patient.  We reviewed the resident's history and exam and pertinent patient test results.  I agree with the assessment, diagnosis, and plan of care documented in the resident's note. 

## 2016-07-29 DIAGNOSIS — J449 Chronic obstructive pulmonary disease, unspecified: Secondary | ICD-10-CM | POA: Diagnosis not present

## 2016-08-22 ENCOUNTER — Other Ambulatory Visit: Payer: Self-pay | Admitting: Internal Medicine

## 2016-08-22 DIAGNOSIS — I10 Essential (primary) hypertension: Secondary | ICD-10-CM

## 2016-08-22 DIAGNOSIS — I5042 Chronic combined systolic (congestive) and diastolic (congestive) heart failure: Secondary | ICD-10-CM

## 2016-08-22 MED ORDER — AMLODIPINE-OLMESARTAN 10-40 MG PO TABS: 1.0000 | ORAL_TABLET | Freq: Every day | ORAL | 3 refills | Status: DC

## 2016-08-22 MED ORDER — METOPROLOL SUCCINATE ER 50 MG PO TB24: 50.0000 mg | ORAL_TABLET | Freq: Every day | ORAL | 3 refills | Status: DC

## 2016-08-22 NOTE — Telephone Encounter (Signed)
amLODipine-olmesartan (AZOR) 10-40 MG tablet metoprolol succinate (TOPROL-XL) 50 MG 24 hr tablet [facility] [address]  Patient is completely out of the amlodipine

## 2016-08-29 ENCOUNTER — Encounter: Payer: Self-pay | Admitting: Internal Medicine

## 2016-08-29 DIAGNOSIS — J449 Chronic obstructive pulmonary disease, unspecified: Secondary | ICD-10-CM | POA: Diagnosis not present

## 2016-08-30 ENCOUNTER — Telehealth: Payer: Self-pay

## 2016-08-30 NOTE — Progress Notes (Signed)
CC: Follow up on T2DM  HPI: [name] is a 65 y.o. female with PMHx of T2DM, Chronic Combined CHF, HTN, NICM, COPD, AICD placement, HLD, COPD who presents to the clinic for follow up for T2DM. Please see problem oriented assessment and plan for more information.  Patient denies chest pain or increased shortness of breath. She is eating and drinking well.   Last visit, patient reported good glucose control on Lantus 37 units QHS, Humalog 10 units QAM, 10 units before lunch, 5 units before dinner and glipizide 5 mg BID. Her CBGs fluctuated between 100-low 200s. She has had new hypoglycemic events occurring primarily before lunch and dinner. She favors keeping on glipizide and reducing her Humalog. At follow up, I would consider discontinuing glipizide and switching to Victoza if she continues to have hypoglycemic episodes.   She continues to have good respiratory status without increased shortness of breath and has been compliant with her regimen of albuterol prn, Advair, and Spiriva.   For her NICM and chronic combined heart failure, patient denies chest pain or increased shortness of breath. She is compliant with her medications of metoprolol 50 mg QD, lasix 60 mg BID, amlodipine-olmesartan 10-40 mg QD.    Past Medical History:  Diagnosis Date  . ATN (acute tubular necrosis) ([place]) 07/15/2014  . Automatic implantable cardioverter-defibrillator in situ   . Cardiac defibrillator in situ    Atlas II VR (SJM) implanted by Dr Lovena Le  . CHF (congestive heart failure) ([place])   . Chronic combined systolic and diastolic heart failure (HCC)    a. EF 35-40% in past;  b. Echo 7/13:  EF 45-50%, Gr 2 diast dysfn, mild AI, mild MAC, trivial MR, mild LAE, PASP 47.  [name] Chronic ulcer of leg ([place])    04-09-15 resolved-not a problem.  . Colon polyps 04/12/2013   Rectosigmoid polyp  . COPD (chronic obstructive pulmonary disease) (HCC)    O2 at night  . Depression   . Diabetes mellitus   . Eczema     . Elevated alkaline phosphatase level    GGT and 5'nucleotidase 8/13 normal  . History of oxygen administration    oxygen @ 2 l/m nasally bedtime 24/7  . HTN (hypertension)   . Hx of cardiac cath    a. [place] 2003 normal;  b. LHC 6/13:  Mild calcification in the LM, o/w normal coronary arteries, EF 45%.   . Hyperlipidemia   . Hyperthyroidism, subclinical   . Implantable cardioverter-defibrillator (ICD) generator end of life   . Lipoma   . NICM (nonischemic cardiomyopathy) ([place])   . Obesity   . Post menopausal syndrome   . Sleep apnea    pt denies 04/12/2013    Review of Systems: Please see pertinent ROS reviewed in HPI and problem based charting.   Physical Exam: Vitals:   08/31/16 1319 08/31/16 1358  BP: (!) 161/80 132/67  Pulse: 86 82  Temp: 97.4 F (36.3 C)   TempSrc: Oral   SpO2: 92%   Weight: 228 lb 3.2 oz (103.5 kg)   Height: 5' (1.524 m)    General: Vital signs reviewed.  Patient is well-developed and well-nourished, in no acute distress and cooperative with exam.  Cardiovascular: RRR, S1 normal, S2 normal, no murmurs, gallops, or rubs. Pulmonary/Chest: Clear to auscultation bilaterally, no wheezes, rales, or rhonchi. Abdominal: Soft, non-tender, non-distended, BS + Extremities: Trace lower extremity edema bilaterally Skin: Warm, dry and intact. No rashes or erythema. Psychiatric: Normal mood and affect.  speech and behavior is normal. Cognition and memory are normal.   Assessment & Plan:  See encounters tab for problem based medical decision making. Patient discussed with Dr. Dareen Piano

## 2016-08-30 NOTE — Assessment & Plan Note (Signed)
She continues to have good respiratory status without increased shortness of breath and has been compliant with her regimen of albuterol prn, Advair, and Spiriva.   Plan: -Continue current regimen

## 2016-08-30 NOTE — Telephone Encounter (Signed)
[name] is a 65 y.o. female who was contacted on behalf of [facility] Geriatrics Task Force. Patient reports that she has missed approximately one week of her Amlodipine-olmesartan and  and only has two days left of her Metoprolol. Informed patient that she has a new prescription for both at [facility] and that she can call and pick it up. [name] also reported that she she does not check her blood pressure at home but does regularly check her BG. She reports that her her FBG was 76 this morning on 08/30/16 and that her usual range no higher than 130. Patient does report one episode of hypoglycemia-like symptoms although she is uncertain what her BG was. Her only symptom was "shakiness". Patient has appointment in [facility] tomorrow on 08/31/16.

## 2016-08-30 NOTE — Assessment & Plan Note (Addendum)
Lab Results  Component Value Date   HGBA1C 7.9 08/31/2016   HGBA1C 8.7 06/01/2016   HGBA1C 13.7 02/10/2016     Assessment: Diabetes control:  Uncontrolled Progress toward A1C goal:   Improving Comments: Last visit, patient reported good glucose control on Lantus 37 units QHS, Humalog 10 units QAM, 10 units before lunch, 5 units before dinner and glipizide 5 mg BID. Her CBGs fluctuated between 100-low 200s. She has had new hypoglycemic events occurring primarily before lunch and dinner. She favors keeping on glipizide and reducing her Humalog. At follow up, I would consider discontinuing glipizide and switching to Victoza if she continues to have hypoglycemic episodes.   Plan: Medications:  Continue Lantus 37 units QHS, Decrease Humalog 7 units QAM, 7 units before lunch, 5 units before dinner and continue glipizide 5 mg BID. Continue metformin 2000 mg QD. Home glucose monitoring: Frequency:  QID Timing:  ACHS Instruction/counseling given: reminded to bring blood glucose meter & log to each visit and reminded to bring medications to each visit Self management tools provided: copy of home glucose meter download Other plans:  At follow up, I would consider discontinuing glipizide and switching to Victoza if she continues to have hypoglycemic episodes.

## 2016-08-30 NOTE — Assessment & Plan Note (Signed)
For her NICM and chronic combined heart failure, patient denies chest pain or increased shortness of breath. She is compliant with her medications of metoprolol 50 mg QD, lasix 60 mg BID, amlodipine-olmesartan 10-40 mg QD.   -Continue current medications

## 2016-08-31 ENCOUNTER — Encounter: Payer: Self-pay | Admitting: Internal Medicine

## 2016-08-31 ENCOUNTER — Ambulatory Visit (INDEPENDENT_AMBULATORY_CARE_PROVIDER_SITE_OTHER): Payer: Medicare Other | Admitting: Internal Medicine

## 2016-08-31 VITALS — BP 132/67 | HR 82 | Temp 97.4°F | Ht 60.0 in | Wt 228.2 lb

## 2016-08-31 DIAGNOSIS — F17210 Nicotine dependence, cigarettes, uncomplicated: Secondary | ICD-10-CM

## 2016-08-31 DIAGNOSIS — E1122 Type 2 diabetes mellitus with diabetic chronic kidney disease: Secondary | ICD-10-CM | POA: Diagnosis not present

## 2016-08-31 DIAGNOSIS — E11649 Type 2 diabetes mellitus with hypoglycemia without coma: Secondary | ICD-10-CM | POA: Diagnosis not present

## 2016-08-31 DIAGNOSIS — Z7951 Long term (current) use of inhaled steroids: Secondary | ICD-10-CM

## 2016-08-31 DIAGNOSIS — I130 Hypertensive heart and chronic kidney disease with heart failure and stage 1 through stage 4 chronic kidney disease, or unspecified chronic kidney disease: Secondary | ICD-10-CM | POA: Diagnosis not present

## 2016-08-31 DIAGNOSIS — J449 Chronic obstructive pulmonary disease, unspecified: Secondary | ICD-10-CM

## 2016-08-31 DIAGNOSIS — N182 Chronic kidney disease, stage 2 (mild): Secondary | ICD-10-CM | POA: Diagnosis not present

## 2016-08-31 DIAGNOSIS — Z0000 Encounter for general adult medical examination without abnormal findings: Secondary | ICD-10-CM

## 2016-08-31 DIAGNOSIS — I428 Other cardiomyopathies: Secondary | ICD-10-CM

## 2016-08-31 DIAGNOSIS — Z794 Long term (current) use of insulin: Secondary | ICD-10-CM | POA: Diagnosis not present

## 2016-08-31 DIAGNOSIS — Z79899 Other long term (current) drug therapy: Secondary | ICD-10-CM

## 2016-08-31 DIAGNOSIS — I5042 Chronic combined systolic (congestive) and diastolic (congestive) heart failure: Secondary | ICD-10-CM

## 2016-08-31 DIAGNOSIS — I10 Essential (primary) hypertension: Secondary | ICD-10-CM

## 2016-08-31 LAB — POCT GLYCOSYLATED HEMOGLOBIN (HGB A1C): Hemoglobin A1C: 7.9

## 2016-08-31 LAB — GLUCOSE, CAPILLARY: Glucose-Capillary: 85 mg/dL (ref 65–99)

## 2016-08-31 MED ORDER — METOPROLOL SUCCINATE ER 50 MG PO TB24: 50.0000 mg | ORAL_TABLET | Freq: Every day | ORAL | 3 refills | Status: DC

## 2016-08-31 MED ORDER — AMLODIPINE-OLMESARTAN 10-40 MG PO TABS: 1.0000 | ORAL_TABLET | Freq: Every day | ORAL | 3 refills | Status: DC

## 2016-08-31 MED ORDER — INSULIN LISPRO 100 UNIT/ML (KWIKPEN): PEN_INJECTOR | SUBCUTANEOUS | 3 refills | Status: DC

## 2016-08-31 NOTE — Patient Instructions (Signed)
Continue to take all medications as prescribed.   For our humalog, please decrease your doses to 7 units before breakfast, 7 units before lunch and 5 units before dinner. All other medications remain the same. If you continue to experience low blood sugar before lunch, we will decrease humalog to 5 units before breakfast.  Follow up in 3 months.   For your gas, please try to avoid the following foods:  In chart

## 2016-08-31 NOTE — Assessment & Plan Note (Addendum)
BP Readings from Last 3 Encounters:  08/31/16 132/67  06/29/16 134/71  06/21/16 134/78    Lab Results  Component Value Date   NA 143 06/16/2016   K 3.8 06/16/2016   CREATININE 0.88 06/16/2016    Assessment: Blood pressure control:  Controlled Progress toward BP goal:    At goal Comments:  On Lasix 60 mg BID, amlodipine- olmesartan 10-40 mg QD and metoprolol 50 mg QD.  Plan: Medications:  continue current medications

## 2016-09-01 NOTE — Assessment & Plan Note (Signed)
Mammogram: Normal 06/2016 Pap Smear: Unclear if patient had total hysterectomy. Would favor completing a pap smear for screening and to evaluate if patient has her cervix

## 2016-09-04 NOTE — Progress Notes (Signed)
Internal Medicine Clinic Attending  Case discussed with Dr. Burns at the time of the visit.  We reviewed the resident's history and exam and pertinent patient test results.  I agree with the assessment, diagnosis, and plan of care documented in the resident's note.  

## 2016-09-05 ENCOUNTER — Encounter: Payer: Medicare Other | Admitting: Internal Medicine

## 2016-09-06 DIAGNOSIS — R10819 Abdominal tenderness, unspecified site: Secondary | ICD-10-CM | POA: Diagnosis not present

## 2016-09-13 NOTE — Telephone Encounter (Signed)
Patient was contacted with Frank Tillman, PharmD candidate. I agree with the assessment and plan of care documented.  

## 2016-09-27 NOTE — Progress Notes (Signed)
Cardiology Office Note Date:  09/28/2016  Patient ID:  [name], [name] 05/15/1951, MRN [id number] PCP:  Florinda Marker, MD  Cardiologist:  Dr. Stanford Breed Electrophysiologist: Dr. Lovena Le   Chief Complaint:  routine in-clinic EP visit  History of Present Illness: [name] is a 65 y.o. female with history of combined CHF, NICM w/ICD, HTN, HLD, COPD uses O2 at [facility], is an ongoing smoker, obesity, and DM.  She comes in today to be seen for Dr. Lovena Le, last seen by him in Nov 2016.    She is feeling well, she has PRN O2 at home and uses every HS, she is actively trying to quit smoking.  She denies any unusual SOB apart from what she feels is her baseline.  She denies nighttime symptoms of PND or orthopnea.  No Dizziness, near syncope or syncope.  No shocks from her ICD.  No CP or palpitations  Prior cardiac testing: LHC 6/13: Mild calcification in the LM, o/w normal coronary arteries, EF 45%.  Admitted 5/17 with encephalopathy and CHF; also with acute renal insuff; improved with therapy. TTE 5/17 showed EF 20-25 (decreased from previous), grade 1 diastolic dysfunction, mildly increased pulmonary pressure. Nuclear study 6/17 showed EF 33, anterolateral ischemia and septal scar/ischemia 06/1916: LHC with angiographically normal coronaries, LVEDP 67mmhg  Device information: SJM single chamber ICD, implanted 04/02/14, Dr. Lovena Le The patient denies aver being shocked    Past Medical History:  Diagnosis Date  . ATN (acute tubular necrosis) ([place]) 07/15/2014  . Automatic implantable cardioverter-defibrillator in situ   . Cardiac defibrillator in situ    Atlas II VR (SJM) implanted by Dr Lovena Le  . CHF (congestive heart failure) ([place])   . Chronic combined systolic and diastolic heart failure (HCC)    a. EF 35-40% in past;  b. Echo 7/13:  EF 45-50%, Gr 2 diast dysfn, mild AI, mild MAC, trivial MR, mild LAE, PASP 47.  [name] Chronic ulcer of leg ([place])    04-09-15 resolved-not a problem.  . Colon  polyps 04/12/2013   Rectosigmoid polyp  . COPD (chronic obstructive pulmonary disease) (HCC)    O2 at night  . Depression   . Diabetes mellitus   . Eczema   . Elevated alkaline phosphatase level    GGT and 5'nucleotidase 8/13 normal  . History of oxygen administration    oxygen @ 2 l/m nasally bedtime 24/7  . HTN (hypertension)   . Hx of cardiac cath    a. [place] 2003 normal;  b. LHC 6/13:  Mild calcification in the LM, o/w normal coronary arteries, EF 45%.   . Hyperlipidemia   . Hyperthyroidism, subclinical   . Implantable cardioverter-defibrillator (ICD) generator end of life   . Lipoma   . NICM (nonischemic cardiomyopathy) ([place])   . Obesity   . Post menopausal syndrome   . Sleep apnea    pt denies 04/12/2013    Past Surgical History:  Procedure Laterality Date  . ABDOMINAL HYSTERECTOMY    . CARDIAC CATHETERIZATION    . CARDIAC CATHETERIZATION N/A 06/21/2016   Procedure: Left Heart Cath and Coronary Angiography;  Surgeon: Sherren Mocha, MD;  Location: [facility];  Service: Cardiovascular;  Laterality: N/A;  . CARDIAC DEFIBRILLATOR PLACEMENT  05/04/2007   SJM Atlas II VR ICD  . CARDIAC DEFIBRILLATOR PLACEMENT    . COLONOSCOPY N/A 04/12/2013   Procedure: COLONOSCOPY;  Surgeon: Beryle Beams, MD;  Location: [facility] ENDOSCOPY;  Service: Endoscopy;  Laterality: N/A;  pt.has defibrilator  .  COLONOSCOPY N/A 04/16/2015   Procedure: COLONOSCOPY;  Surgeon: [name], MD;  Location: [facility] ENDOSCOPY;  Service: Endoscopy;  Laterality: N/A;  . ESOPHAGOGASTRODUODENOSCOPY N/A 04/16/2015   Procedure: ESOPHAGOGASTRODUODENOSCOPY (EGD);  Surgeon: [name], MD;  Location: [facility];  Service: Endoscopy;  Laterality: N/A;  . HERNIA REPAIR    . HYSTEROSCOPY    . IMPLANTABLE CARDIOVERTER DEFIBRILLATOR (ICD) GENERATOR CHANGE N/A 04/02/2014   Procedure: ICD GENERATOR CHANGE;  Surgeon: Evans Lance, MD;  Location: [facility] CATH LAB;  Service: Cardiovascular;  Laterality: N/A;  . INSERT /  REPLACE / REMOVE PACEMAKER    . LEFT HEART CATHETERIZATION WITH CORONARY ANGIOGRAM N/A 04/02/2012   Procedure: LEFT HEART CATHETERIZATION WITH CORONARY ANGIOGRAM;  Surgeon: Hillary Bow, MD;  Location: [facility] CATH LAB;  Service: Cardiovascular;  Laterality: N/A;  . TUBAL LIGATION      Current Outpatient Prescriptions  Medication Sig Dispense Refill  . acetaminophen (TYLENOL) 325 MG tablet Take 2 tablets (650 mg total) by mouth every 6 (six) hours as needed. 100 tablet 2  . albuterol (PROVENTIL) (2.5 MG/3ML) 0.083% nebulizer solution USE 1 VIAL VIA NEBULIZER EVERY 6 HOURS AS NEEDED FOR WHEEZING OR SHORTNESS OF BREATH 1125 mL 3  . amLODipine-olmesartan (AZOR) 10-40 MG tablet Take 1 tablet by mouth daily. 90 tablet 3  . aspirin EC 81 MG tablet Take 1 tablet (81 mg total) by mouth daily. 90 tablet 3  . atorvastatin (LIPITOR) 80 MG tablet Take 1 tablet (80 mg total) by mouth at bedtime. 90 tablet 3  . Fluticasone-Salmeterol (ADVAIR DISKUS) 250-50 MCG/DOSE AEPB Inhale 1 puff into the lungs 2 (two) times daily. 1 each 5  . furosemide (LASIX) 20 MG tablet Take 3 tablets (60 mg total) by mouth 2 (two) times daily. 180 tablet 2  . glipiZIDE (GLUCOTROL) 5 MG tablet Take 1 tablet (5 mg total) by mouth 2 (two) times daily before a meal. 60 tablet 2  . glucose blood (ONE TOUCH ULTRA TEST) test strip Use as instructed by doctor up to 3x daily,dx code 250.02 insulin requiring 100 each 5  . insulin glargine (LANTUS) 100 UNIT/ML injection Inject 35 Units into the skin at bedtime.    . insulin lispro (HUMALOG KWIKPEN) 100 UNIT/ML KiwkPen Take 7 units with breakfast, 7 units with lunch, and 5 units with dinner. 15 mL 3  . Insulin Syringe-Needle U-100 31G X 5/16" 1 ML MISC Use to give Lantus Insulin daily. E11.22. 100 each 11  . metFORMIN (GLUCOPHAGE-XR) 500 MG 24 hr tablet Take 4 tablets (2,000 mg total) by mouth daily with breakfast. 360 tablet 3  . metoprolol succinate (TOPROL-XL) 50 MG 24 hr tablet Take 1 tablet  (50 mg total) by mouth daily. 90 tablet 3  . nitroGLYCERIN (NITROSTAT) 0.4 MG SL tablet Place 0.4 mg under the tongue every 5 (five) minutes as needed for chest pain. Reported on 03/18/2016    . tiotropium (SPIRIVA) 18 MCG inhalation capsule Place 1 capsule (18 mcg total) into inhaler and inhale daily. 30 capsule 11   No current facility-administered medications for this visit.     Allergies:   Actos [pioglitazone]; Naproxen; Rosiglitazone; and Hydrocortisone   Social History:  The patient  reports that she has been smoking Cigarettes.  She has a 7.00 pack-year smoking history. She has never used smokeless tobacco. She reports that she does not drink alcohol or use drugs.   Family History:  The patient's family history includes Asthma in her maternal aunt and maternal uncle; Diabetes  in her sister; Heart disease in her father, maternal aunt, maternal grandfather, maternal uncle, paternal aunt, and paternal uncle; Seizures in her father; Stroke in her mother.  ROS:  Please see the history of present illness.  All other systems are reviewed and otherwise negative.   PHYSICAL EXAM:  VS:  BP (!) 166/90 (BP Location: Left Arm)   Pulse 97   Ht 5' (1.524 m)   Wt 228 lb 9.6 oz (103.7 kg)   BMI 44.65 kg/m  BMI: Body mass index is 44.65 kg/m. Well nourished, well developed, in no acute distress  HEENT: normocephalic, atraumatic  Neck: no JVD, carotid bruits or masses Cardiac:  RRR; no significant murmurs, no rubs, or gallops Lungs: clear to auscultation bilaterally, no wheezing, rhonchi or rales  Abd: soft, nontender MS: no deformity or atrophy Ext: trace edema b/l Skin: warm and dry, no rash Neuro:  No gross deficits appreciated Psych: euthymic mood, full affect   ICD site is stable, no tethering or discomfort   EKG:  Done 06/21/16 is SR ICD interrogation done today and reviewed by myself : stable battery and lead measurements, no VT   Recent Labs: 02/10/2016: ALT 18; Magnesium 1.8;  TSH 0.603 02/11/2016: B Natriuretic Peptide 25.0 06/16/2016: BUN 19; Creat 0.88; Hemoglobin 13.9; Platelets 216; Potassium 3.8; Sodium 143  No results found for requested labs within last 8760 hours.   CrCl cannot be calculated (Patient's most recent lab result is older than the maximum 21 days allowed.).   Wt Readings from Last 3 Encounters:  09/28/16 228 lb 9.6 oz (103.7 kg)  08/31/16 228 lb 3.2 oz (103.5 kg)  06/29/16 228 lb 8 oz (103.6 kg)     Other studies reviewed: Additional studies/records reviewed today include: summarized above  ASSESSMENT AND PLAN:  1. ICD     normal device function, no changes made      She does not want to do remote device checks, will scheduled device clinic in 3 months  2. NICM, combined CHF     CorVue is below threshold but trending upwards, weight is stable, no symptoms of fluid OL     On ARB/BB     Counseled on daily weights, minimizing sodium     Discussed enrolling her in ICM, though she reports that [facility] RN calls her monthly and checks on her weight, BP, symptoms already  3. HTN     High here today but she has not taken any of her meds this morning, likes to wait until she gets back home     She reports her BP usually much lower  4. Smoking     Counseled, she is actively trying to quit, encouraged to keep at it   Disposition: F/u with   Current medicines are reviewed at length with the patient today.  The patient did not have any concerns regarding medicines.  Haywood Lasso, PA-C 09/28/2016 12:42 PM     [address] [place] [address] [phone] (office)  [phone] (fax)

## 2016-09-28 ENCOUNTER — Encounter: Payer: Self-pay | Admitting: Physician Assistant

## 2016-09-28 ENCOUNTER — Ambulatory Visit (INDEPENDENT_AMBULATORY_CARE_PROVIDER_SITE_OTHER): Payer: Medicare Other | Admitting: Physician Assistant

## 2016-09-28 VITALS — BP 166/90 | HR 97 | Ht 60.0 in | Wt 228.6 lb

## 2016-09-28 DIAGNOSIS — I5022 Chronic systolic (congestive) heart failure: Secondary | ICD-10-CM

## 2016-09-28 DIAGNOSIS — I420 Dilated cardiomyopathy: Secondary | ICD-10-CM | POA: Diagnosis not present

## 2016-09-28 DIAGNOSIS — F17200 Nicotine dependence, unspecified, uncomplicated: Secondary | ICD-10-CM

## 2016-09-28 DIAGNOSIS — J449 Chronic obstructive pulmonary disease, unspecified: Secondary | ICD-10-CM | POA: Diagnosis not present

## 2016-09-28 DIAGNOSIS — IMO0001 Reserved for inherently not codable concepts without codable children: Secondary | ICD-10-CM

## 2016-09-28 DIAGNOSIS — I10 Essential (primary) hypertension: Secondary | ICD-10-CM | POA: Diagnosis not present

## 2016-09-28 DIAGNOSIS — Z95810 Presence of automatic (implantable) cardiac defibrillator: Secondary | ICD-10-CM | POA: Diagnosis not present

## 2016-09-28 NOTE — Patient Instructions (Signed)
Medication Instructions:   Your physician recommends that you continue on your current medications as directed. Please refer to the Current Medication list given to you today.   If you need a refill on your cardiac medications before your next appointment, please call your pharmacy.  Labwork: NONE ORDERED  TODAY    Testing/Procedures: NONE ORDERED  TODAY    Follow-Up: 3  MONTHS WITH DEVICE CLINIC  Your physician wants you to follow-up in: [place] will receive a reminder letter in the mail two months in advance. If you don't receive a letter, please call our office to schedule the follow-up appointment.     Any Other Special Instructions Will Be Listed Below (If Applicable).

## 2016-10-11 ENCOUNTER — Encounter (HOSPITAL_COMMUNITY): Payer: Self-pay | Admitting: Emergency Medicine

## 2016-10-11 ENCOUNTER — Inpatient Hospital Stay (HOSPITAL_COMMUNITY)
Admission: EM | Admit: 2016-10-11 | Discharge: 2016-10-14 | DRG: 871 | Disposition: A | Discharge status: Home Health | Payer: Medicare Other | Attending: Internal Medicine | Admitting: Internal Medicine

## 2016-10-11 ENCOUNTER — Emergency Department (HOSPITAL_COMMUNITY): Payer: Medicare Other

## 2016-10-11 ENCOUNTER — Inpatient Hospital Stay (HOSPITAL_COMMUNITY): Payer: Medicare Other

## 2016-10-11 DIAGNOSIS — A419 Sepsis, unspecified organism: Secondary | ICD-10-CM | POA: Diagnosis not present

## 2016-10-11 DIAGNOSIS — F17210 Nicotine dependence, cigarettes, uncomplicated: Secondary | ICD-10-CM | POA: Diagnosis present

## 2016-10-11 DIAGNOSIS — R05 Cough: Secondary | ICD-10-CM

## 2016-10-11 DIAGNOSIS — Z888 Allergy status to other drugs, medicaments and biological substances status: Secondary | ICD-10-CM

## 2016-10-11 DIAGNOSIS — J9611 Chronic respiratory failure with hypoxia: Secondary | ICD-10-CM | POA: Diagnosis not present

## 2016-10-11 DIAGNOSIS — T380X5A Adverse effect of glucocorticoids and synthetic analogues, initial encounter: Secondary | ICD-10-CM | POA: Diagnosis not present

## 2016-10-11 DIAGNOSIS — R590 Localized enlarged lymph nodes: Secondary | ICD-10-CM | POA: Diagnosis present

## 2016-10-11 DIAGNOSIS — J441 Chronic obstructive pulmonary disease with (acute) exacerbation: Secondary | ICD-10-CM | POA: Diagnosis not present

## 2016-10-11 DIAGNOSIS — I130 Hypertensive heart and chronic kidney disease with heart failure and stage 1 through stage 4 chronic kidney disease, or unspecified chronic kidney disease: Secondary | ICD-10-CM | POA: Diagnosis present

## 2016-10-11 DIAGNOSIS — E1129 Type 2 diabetes mellitus with other diabetic kidney complication: Secondary | ICD-10-CM | POA: Diagnosis present

## 2016-10-11 DIAGNOSIS — J181 Lobar pneumonia, unspecified organism: Secondary | ICD-10-CM | POA: Diagnosis present

## 2016-10-11 DIAGNOSIS — R069 Unspecified abnormalities of breathing: Secondary | ICD-10-CM | POA: Diagnosis not present

## 2016-10-11 DIAGNOSIS — R059 Cough, unspecified: Secondary | ICD-10-CM

## 2016-10-11 DIAGNOSIS — I5042 Chronic combined systolic (congestive) and diastolic (congestive) heart failure: Secondary | ICD-10-CM | POA: Diagnosis present

## 2016-10-11 DIAGNOSIS — E1165 Type 2 diabetes mellitus with hyperglycemia: Secondary | ICD-10-CM | POA: Diagnosis not present

## 2016-10-11 DIAGNOSIS — I480 Paroxysmal atrial fibrillation: Secondary | ICD-10-CM | POA: Diagnosis present

## 2016-10-11 DIAGNOSIS — J209 Acute bronchitis, unspecified: Secondary | ICD-10-CM | POA: Diagnosis not present

## 2016-10-11 DIAGNOSIS — E118 Type 2 diabetes mellitus with unspecified complications: Secondary | ICD-10-CM

## 2016-10-11 DIAGNOSIS — J111 Influenza due to unidentified influenza virus with other respiratory manifestations: Secondary | ICD-10-CM

## 2016-10-11 DIAGNOSIS — I10 Essential (primary) hypertension: Secondary | ICD-10-CM | POA: Diagnosis present

## 2016-10-11 DIAGNOSIS — Z794 Long term (current) use of insulin: Secondary | ICD-10-CM

## 2016-10-11 DIAGNOSIS — E119 Type 2 diabetes mellitus without complications: Secondary | ICD-10-CM | POA: Diagnosis present

## 2016-10-11 DIAGNOSIS — N182 Chronic kidney disease, stage 2 (mild): Secondary | ICD-10-CM | POA: Diagnosis not present

## 2016-10-11 DIAGNOSIS — E785 Hyperlipidemia, unspecified: Secondary | ICD-10-CM | POA: Diagnosis present

## 2016-10-11 DIAGNOSIS — J189 Pneumonia, unspecified organism: Secondary | ICD-10-CM | POA: Diagnosis present

## 2016-10-11 DIAGNOSIS — J440 Chronic obstructive pulmonary disease with acute lower respiratory infection: Secondary | ICD-10-CM | POA: Diagnosis not present

## 2016-10-11 DIAGNOSIS — Z6841 Body Mass Index (BMI) 40.0 and over, adult: Secondary | ICD-10-CM

## 2016-10-11 DIAGNOSIS — E0590 Thyrotoxicosis, unspecified without thyrotoxic crisis or storm: Secondary | ICD-10-CM | POA: Diagnosis not present

## 2016-10-11 DIAGNOSIS — E875 Hyperkalemia: Secondary | ICD-10-CM | POA: Diagnosis present

## 2016-10-11 DIAGNOSIS — J1008 Influenza due to other identified influenza virus with other specified pneumonia: Secondary | ICD-10-CM | POA: Diagnosis present

## 2016-10-11 DIAGNOSIS — K5900 Constipation, unspecified: Secondary | ICD-10-CM | POA: Diagnosis present

## 2016-10-11 DIAGNOSIS — Z95810 Presence of automatic (implantable) cardiac defibrillator: Secondary | ICD-10-CM | POA: Diagnosis present

## 2016-10-11 DIAGNOSIS — E1169 Type 2 diabetes mellitus with other specified complication: Secondary | ICD-10-CM | POA: Diagnosis present

## 2016-10-11 DIAGNOSIS — J9621 Acute and chronic respiratory failure with hypoxia: Secondary | ICD-10-CM | POA: Diagnosis not present

## 2016-10-11 DIAGNOSIS — E1122 Type 2 diabetes mellitus with diabetic chronic kidney disease: Secondary | ICD-10-CM | POA: Diagnosis present

## 2016-10-11 DIAGNOSIS — Z9981 Dependence on supplemental oxygen: Secondary | ICD-10-CM

## 2016-10-11 DIAGNOSIS — I428 Other cardiomyopathies: Secondary | ICD-10-CM | POA: Diagnosis not present

## 2016-10-11 DIAGNOSIS — E1142 Type 2 diabetes mellitus with diabetic polyneuropathy: Secondary | ICD-10-CM

## 2016-10-11 DIAGNOSIS — J9601 Acute respiratory failure with hypoxia: Secondary | ICD-10-CM | POA: Diagnosis not present

## 2016-10-11 DIAGNOSIS — Z7982 Long term (current) use of aspirin: Secondary | ICD-10-CM

## 2016-10-11 DIAGNOSIS — R0602 Shortness of breath: Secondary | ICD-10-CM | POA: Diagnosis not present

## 2016-10-11 DIAGNOSIS — Z79899 Other long term (current) drug therapy: Secondary | ICD-10-CM

## 2016-10-11 HISTORY — DX: Dependence on supplemental oxygen: Z99.81

## 2016-10-11 LAB — CBC WITH DIFFERENTIAL/PLATELET
Basophils Absolute: 0.1 10*3/uL (ref 0.0–0.1)
Basophils Absolute: 0 10*3/uL (ref 0.0–0.1)
Basophils Relative: 1 %
Basophils Relative: 0 %
Eosinophils Absolute: 0 10*3/uL (ref 0.0–0.7)
Eosinophils Absolute: 0 10*3/uL (ref 0.0–0.7)
Eosinophils Relative: 0 %
Eosinophils Relative: 0 %
HCT: 45.6 % (ref 36.0–46.0)
HCT: 44.1 % (ref 36.0–46.0)
Hemoglobin: 15.2 g/dL — ABNORMAL HIGH (ref 12.0–15.0)
Hemoglobin: 13.9 g/dL (ref 12.0–15.0)
Lymphocytes Relative: 18 %
Lymphocytes Relative: 3 %
Lymphs Abs: 1.5 10*3/uL (ref 0.7–4.0)
Lymphs Abs: 0.2 10*3/uL — ABNORMAL LOW (ref 0.7–4.0)
MCH: 28.7 pg (ref 26.0–34.0)
MCH: 27.6 pg (ref 26.0–34.0)
MCHC: 33.3 g/dL (ref 30.0–36.0)
MCHC: 31.5 g/dL (ref 30.0–36.0)
MCV: 86.2 fL (ref 78.0–100.0)
MCV: 87.7 fL (ref 78.0–100.0)
Monocytes Absolute: 0.6 10*3/uL (ref 0.1–1.0)
Monocytes Absolute: 0.1 10*3/uL (ref 0.1–1.0)
Monocytes Relative: 7 %
Monocytes Relative: 1 %
Neutro Abs: 6.5 10*3/uL (ref 1.7–7.7)
Neutro Abs: 6.2 10*3/uL (ref 1.7–7.7)
Neutrophils Relative %: 75 %
Neutrophils Relative %: 96 %
Platelets: 144 10*3/uL — ABNORMAL LOW (ref 150–400)
Platelets: 148 10*3/uL — ABNORMAL LOW (ref 150–400)
RBC: 5.29 MIL/uL — ABNORMAL HIGH (ref 3.87–5.11)
RBC: 5.03 MIL/uL (ref 3.87–5.11)
RDW: 16.7 % — ABNORMAL HIGH (ref 11.5–15.5)
RDW: 17.1 % — ABNORMAL HIGH (ref 11.5–15.5)
WBC: 8.6 10*3/uL (ref 4.0–10.5)
WBC: 6.4 10*3/uL (ref 4.0–10.5)

## 2016-10-11 LAB — I-STAT TROPONIN, ED
Troponin i, poc: 0.06 ng/mL (ref 0.00–0.08)
Troponin i, poc: 0.06 ng/mL (ref 0.00–0.08)

## 2016-10-11 LAB — COMPREHENSIVE METABOLIC PANEL
ALT: 20 U/L (ref 14–54)
AST: 29 U/L (ref 15–41)
Albumin: 3.6 g/dL (ref 3.5–5.0)
Alkaline Phosphatase: 117 U/L (ref 38–126)
Anion gap: 12 (ref 5–15)
BUN: 14 mg/dL (ref 6–20)
CO2: 25 mmol/L (ref 22–32)
Calcium: 8.4 mg/dL — ABNORMAL LOW (ref 8.9–10.3)
Chloride: 99 mmol/L — ABNORMAL LOW (ref 101–111)
Creatinine, Ser: 1.02 mg/dL — ABNORMAL HIGH (ref 0.44–1.00)
GFR calc Af Amer: >60 mL/min (ref >60)
GFR calc non Af Amer: 57 mL/min — ABNORMAL LOW (ref >60)
Glucose, Bld: 460 mg/dL — ABNORMAL HIGH (ref 65–99)
Potassium: 3.9 mmol/L (ref 3.5–5.1)
Sodium: 136 mmol/L (ref 135–145)
Total Bilirubin: 0.5 mg/dL (ref 0.3–1.2)
Total Protein: 6.8 g/dL (ref 6.5–8.1)

## 2016-10-11 LAB — PROTIME-INR
INR: 1.1
Prothrombin Time: 14.3 seconds (ref 11.4–15.2)

## 2016-10-11 LAB — BASIC METABOLIC PANEL
Anion gap: 15 (ref 5–15)
BUN: 12 mg/dL (ref 6–20)
CO2: 22 mmol/L (ref 22–32)
Calcium: 9 mg/dL (ref 8.9–10.3)
Chloride: 103 mmol/L (ref 101–111)
Creatinine, Ser: 0.84 mg/dL (ref 0.44–1.00)
GFR calc Af Amer: >60 mL/min (ref >60)
GFR calc non Af Amer: >60 mL/min (ref >60)
Glucose, Bld: 247 mg/dL — ABNORMAL HIGH (ref 65–99)
Potassium: 3.7 mmol/L (ref 3.5–5.1)
Sodium: 140 mmol/L (ref 135–145)

## 2016-10-11 LAB — GLUCOSE, CAPILLARY: Glucose-Capillary: 360 mg/dL — ABNORMAL HIGH (ref 65–99)

## 2016-10-11 LAB — I-STAT CG4 LACTIC ACID, ED
Lactic Acid, Venous: 3.65 mmol/L (ref 0.5–1.9)
Lactic Acid, Venous: 3.65 mmol/L (ref 0.5–1.9)
Lactic Acid, Venous: 5.17 mmol/L (ref 0.5–1.9)

## 2016-10-11 LAB — BRAIN NATRIURETIC PEPTIDE: B Natriuretic Peptide: 204.8 pg/mL — ABNORMAL HIGH (ref 0.0–100.0)

## 2016-10-11 LAB — PROCALCITONIN: Procalcitonin: 0.18 ng/mL

## 2016-10-11 LAB — INFLUENZA PANEL BY PCR (TYPE A & B)
Influenza A By PCR: POSITIVE — AB
Influenza B By PCR: NEGATIVE

## 2016-10-11 LAB — APTT: aPTT: 36 seconds (ref 24–36)

## 2016-10-11 LAB — CBG MONITORING, ED
Glucose-Capillary: 447 mg/dL — ABNORMAL HIGH (ref 65–99)
Glucose-Capillary: 425 mg/dL — ABNORMAL HIGH (ref 65–99)

## 2016-10-11 LAB — LACTIC ACID, PLASMA
Lactic Acid, Venous: 3.9 mmol/L (ref 0.5–1.9)
Lactic Acid, Venous: 3.3 mmol/L (ref 0.5–1.9)

## 2016-10-11 MED ORDER — SODIUM CHLORIDE 0.9 % IV BOLUS (SEPSIS)
1000.0000 mL | Freq: Once | INTRAVENOUS | Status: AC
  Administered 2016-10-11: 1000 mL via INTRAVENOUS

## 2016-10-11 MED ORDER — HEPARIN SODIUM (PORCINE) 5000 UNIT/ML IJ SOLN
5000.0000 [IU] | Freq: Three times a day (TID) | INTRAMUSCULAR | Status: DC
  Administered 2016-10-12 – 2016-10-13 (×7): 5000 [IU] via SUBCUTANEOUS
  Filled 2016-10-11 (×5): qty 1

## 2016-10-11 MED ORDER — TRAZODONE HCL 50 MG PO TABS: 50.0000 mg | ORAL_TABLET | Freq: Every evening | ORAL | Status: DC | PRN

## 2016-10-11 MED ORDER — INSULIN GLARGINE 100 UNIT/ML ~~LOC~~ SOLN
35.0000 [IU] | Freq: Every day | SUBCUTANEOUS | Status: DC
  Administered 2016-10-12 – 2016-10-13 (×3): 35 [IU] via SUBCUTANEOUS
  Filled 2016-10-11 (×4): qty 0.35

## 2016-10-11 MED ORDER — SODIUM CHLORIDE 0.9 % IV BOLUS (SEPSIS): 250.0000 mL | Freq: Once | INTRAVENOUS | Status: DC

## 2016-10-11 MED ORDER — INSULIN ASPART 100 UNIT/ML ~~LOC~~ SOLN
7.0000 [IU] | Freq: Two times a day (BID) | SUBCUTANEOUS | Status: DC
  Administered 2016-10-12 – 2016-10-14 (×2): 7 [IU] via SUBCUTANEOUS

## 2016-10-11 MED ORDER — DEXTROSE 5 % IV SOLN
500.0000 mg | Freq: Once | INTRAVENOUS | Status: AC
  Administered 2016-10-11: 500 mg via INTRAVENOUS
  Filled 2016-10-11: qty 500

## 2016-10-11 MED ORDER — FUROSEMIDE 20 MG PO TABS: 60.0000 mg | ORAL_TABLET | Freq: Two times a day (BID) | ORAL | Status: DC

## 2016-10-11 MED ORDER — ENSURE ENLIVE PO LIQD
237.0000 mL | Freq: Two times a day (BID) | ORAL | Status: DC
  Administered 2016-10-12: 237 mL via ORAL

## 2016-10-11 MED ORDER — OSELTAMIVIR PHOSPHATE 75 MG PO CAPS
75.0000 mg | ORAL_CAPSULE | Freq: Once | ORAL | Status: AC
  Administered 2016-10-11: 75 mg via ORAL
  Filled 2016-10-11: qty 1

## 2016-10-11 MED ORDER — SODIUM CHLORIDE 0.9 % IV BOLUS (SEPSIS): 500.0000 mL | Freq: Once | INTRAVENOUS | Status: DC

## 2016-10-11 MED ORDER — TRAMADOL HCL 50 MG PO TABS
50.0000 mg | ORAL_TABLET | Freq: Three times a day (TID) | ORAL | Status: DC | PRN
  Administered 2016-10-12: 50 mg via ORAL
  Filled 2016-10-11: qty 1

## 2016-10-11 MED ORDER — IPRATROPIUM-ALBUTEROL 0.5-2.5 (3) MG/3ML IN SOLN
3.0000 mL | Freq: Four times a day (QID) | RESPIRATORY_TRACT | Status: DC
  Administered 2016-10-12 – 2016-10-13 (×5): 3 mL via RESPIRATORY_TRACT
  Filled 2016-10-11 (×5): qty 3

## 2016-10-11 MED ORDER — FUROSEMIDE 10 MG/ML IJ SOLN
60.0000 mg | Freq: Two times a day (BID) | INTRAMUSCULAR | Status: DC
  Administered 2016-10-12 – 2016-10-14 (×5): 60 mg via INTRAVENOUS
  Filled 2016-10-11 (×5): qty 6

## 2016-10-11 MED ORDER — SODIUM CHLORIDE 0.9 % IV BOLUS (SEPSIS)
250.0000 mL | Freq: Once | INTRAVENOUS | Status: AC
  Administered 2016-10-11: 250 mL via INTRAVENOUS

## 2016-10-11 MED ORDER — SODIUM CHLORIDE 0.9 % IV SOLN
INTRAVENOUS | Status: DC
  Administered 2016-10-11: 100 mL via INTRAVENOUS

## 2016-10-11 MED ORDER — AMLODIPINE BESYLATE 5 MG PO TABS
5.0000 mg | ORAL_TABLET | Freq: Every day | ORAL | Status: DC
  Administered 2016-10-11 – 2016-10-14 (×4): 5 mg via ORAL
  Filled 2016-10-11 (×4): qty 1

## 2016-10-11 MED ORDER — ACETAMINOPHEN 325 MG PO TABS: 650.0000 mg | ORAL_TABLET | Freq: Four times a day (QID) | ORAL | Status: DC | PRN

## 2016-10-11 MED ORDER — INSULIN ASPART 100 UNIT/ML ~~LOC~~ SOLN
20.0000 [IU] | Freq: Once | SUBCUTANEOUS | Status: AC
  Administered 2016-10-11: 20 [IU] via SUBCUTANEOUS
  Filled 2016-10-11: qty 1

## 2016-10-11 MED ORDER — ACETAMINOPHEN 650 MG RE SUPP: 650.0000 mg | Freq: Four times a day (QID) | RECTAL | Status: DC | PRN

## 2016-10-11 MED ORDER — OSELTAMIVIR PHOSPHATE 75 MG PO CAPS
75.0000 mg | ORAL_CAPSULE | Freq: Two times a day (BID) | ORAL | Status: DC
  Administered 2016-10-11 – 2016-10-14 (×6): 75 mg via ORAL
  Filled 2016-10-11 (×6): qty 1

## 2016-10-11 MED ORDER — INSULIN ASPART 100 UNIT/ML ~~LOC~~ SOLN
0.0000 [IU] | Freq: Three times a day (TID) | SUBCUTANEOUS | Status: DC
  Administered 2016-10-12 (×2): 9 [IU] via SUBCUTANEOUS
  Administered 2016-10-12: 5 [IU] via SUBCUTANEOUS
  Administered 2016-10-13: 7 [IU] via SUBCUTANEOUS
  Administered 2016-10-13: 3 [IU] via SUBCUTANEOUS
  Administered 2016-10-13: 7 [IU] via SUBCUTANEOUS
  Administered 2016-10-14 (×2): 1 [IU] via SUBCUTANEOUS

## 2016-10-11 MED ORDER — METHYLPREDNISOLONE SODIUM SUCC 125 MG IJ SOLR
60.0000 mg | Freq: Two times a day (BID) | INTRAMUSCULAR | Status: DC
  Administered 2016-10-11 – 2016-10-13 (×4): 60 mg via INTRAVENOUS
  Filled 2016-10-11 (×3): qty 2

## 2016-10-11 MED ORDER — ATORVASTATIN CALCIUM 80 MG PO TABS
80.0000 mg | ORAL_TABLET | Freq: Every day | ORAL | Status: DC
Start: 2016-10-11 — End: 2016-10-14
  Administered 2016-10-12 – 2016-10-13 (×2): 80 mg via ORAL
  Filled 2016-10-11 (×2): qty 1

## 2016-10-11 MED ORDER — IRBESARTAN 300 MG PO TABS
300.0000 mg | ORAL_TABLET | Freq: Every day | ORAL | Status: DC
  Administered 2016-10-11 – 2016-10-12 (×2): 300 mg via ORAL
  Filled 2016-10-11 (×3): qty 1

## 2016-10-11 MED ORDER — POLYETHYLENE GLYCOL 3350 17 G PO PACK
17.0000 g | PACK | Freq: Every day | ORAL | Status: DC | PRN
  Filled 2016-10-11: qty 1

## 2016-10-11 MED ORDER — AMLODIPINE-OLMESARTAN 10-40 MG PO TABS: 1.0000 | ORAL_TABLET | Freq: Every day | ORAL | Status: DC

## 2016-10-11 MED ORDER — DEXTROSE 5 % IV SOLN
1.0000 g | Freq: Once | INTRAVENOUS | Status: AC
  Administered 2016-10-11: 1 g via INTRAVENOUS
  Filled 2016-10-11: qty 10

## 2016-10-11 MED ORDER — ALBUTEROL SULFATE (2.5 MG/3ML) 0.083% IN NEBU
5.0000 mg | INHALATION_SOLUTION | Freq: Once | RESPIRATORY_TRACT | Status: AC
  Administered 2016-10-11: 5 mg via RESPIRATORY_TRACT
  Filled 2016-10-11: qty 6

## 2016-10-11 MED ORDER — ALBUTEROL SULFATE (2.5 MG/3ML) 0.083% IN NEBU
INHALATION_SOLUTION | RESPIRATORY_TRACT | Status: AC
  Administered 2016-10-11: 2.5 mg
  Filled 2016-10-11: qty 3

## 2016-10-11 MED ORDER — METOPROLOL SUCCINATE ER 50 MG PO TB24
50.0000 mg | ORAL_TABLET | Freq: Every day | ORAL | Status: DC
  Administered 2016-10-11 – 2016-10-14 (×4): 50 mg via ORAL
  Filled 2016-10-11 (×4): qty 1

## 2016-10-11 MED ORDER — INSULIN LISPRO 100 UNIT/ML (KWIKPEN): 7.0000 [IU] | PEN_INJECTOR | Freq: Two times a day (BID) | SUBCUTANEOUS | Status: DC

## 2016-10-11 MED ORDER — SODIUM CHLORIDE 0.9% FLUSH
3.0000 mL | Freq: Two times a day (BID) | INTRAVENOUS | Status: DC
  Administered 2016-10-12 – 2016-10-14 (×4): 3 mL via INTRAVENOUS

## 2016-10-11 MED ORDER — ONDANSETRON HCL 4 MG/2ML IJ SOLN: 4.0000 mg | Freq: Four times a day (QID) | INTRAMUSCULAR | Status: DC | PRN

## 2016-10-11 MED ORDER — ALBUTEROL SULFATE (2.5 MG/3ML) 0.083% IN NEBU: 2.5000 mg | INHALATION_SOLUTION | RESPIRATORY_TRACT | Status: DC | PRN

## 2016-10-11 MED ORDER — ONDANSETRON HCL 4 MG PO TABS: 4.0000 mg | ORAL_TABLET | Freq: Four times a day (QID) | ORAL | Status: DC | PRN

## 2016-10-11 MED ORDER — ACETAMINOPHEN 500 MG PO TABS
1000.0000 mg | ORAL_TABLET | Freq: Once | ORAL | Status: AC
  Administered 2016-10-11: 1000 mg via ORAL
  Filled 2016-10-11: qty 2

## 2016-10-11 MED ORDER — NITROGLYCERIN 0.4 MG SL SUBL: 0.4000 mg | SUBLINGUAL_TABLET | SUBLINGUAL | Status: DC | PRN

## 2016-10-11 MED ORDER — IPRATROPIUM-ALBUTEROL 0.5-2.5 (3) MG/3ML IN SOLN
3.0000 mL | Freq: Four times a day (QID) | RESPIRATORY_TRACT | Status: DC
  Administered 2016-10-11: 3 mL via RESPIRATORY_TRACT
  Filled 2016-10-11: qty 3

## 2016-10-11 MED ORDER — ASPIRIN EC 81 MG PO TBEC
81.0000 mg | DELAYED_RELEASE_TABLET | Freq: Every day | ORAL | Status: DC
  Administered 2016-10-12 – 2016-10-14 (×3): 81 mg via ORAL
  Filled 2016-10-11 (×3): qty 1

## 2016-10-11 MED ORDER — IPRATROPIUM BROMIDE 0.02 % IN SOLN: 0.5000 mg | RESPIRATORY_TRACT | Status: DC | PRN

## 2016-10-11 MED ORDER — INSULIN ASPART 100 UNIT/ML ~~LOC~~ SOLN
15.0000 [IU] | Freq: Once | SUBCUTANEOUS | Status: AC
  Administered 2016-10-11: 15 [IU] via SUBCUTANEOUS
  Filled 2016-10-11: qty 1

## 2016-10-11 NOTE — ED Notes (Addendum)
Dr. Oleta Mouse at bedside, pt taken off mask, transitioned to 4L [place]. Sats goal 88-93%

## 2016-10-11 NOTE — ED Provider Notes (Signed)
[facility] DEPT Provider Note   CSN: [id number] Arrival date & time: 10/11/16  0955     History   Chief Complaint Chief Complaint  Patient presents with  . COPD  . Fever    HPI [name] is a 66 y.o. female.  HPI 66 year old female who presents with shortness of breath. She has a history of COPD, chronic respiratory failure on 2 L home oxygen, nonischemic cardiomyopathy with EF of 20-25% status post AICD, PAF, diabetes, hypertension, hyperlipidemia. States gradual onset of worsening cough productive of clear sputum over the past 2 days associated with progressively worsening shortness of breath. Has noted minimal lower extremity edema and abdominal girth increase. Baseline orthopnea, but increased PND over this period of time. Has had subjective fevers and chills. No known sick contacts. Occasional vomiting of clear fluid, but no diarrhea and no urinary symptoms. Denies any chest pain or syncope.  EMS was called to patient's residence today. She was noted to be 88% on 2 L nasal cannula. Was given told them, Solu-Medrol, and magnesium sulfate. Past Medical History:  Diagnosis Date  . ATN (acute tubular necrosis) ([place]) 07/15/2014  . Automatic implantable cardioverter-defibrillator in situ   . Cardiac defibrillator in situ    Atlas II VR (SJM) implanted by Dr Lovena Le  . CHF (congestive heart failure) ([place])   . Chronic combined systolic and diastolic heart failure (HCC)    a. EF 35-40% in past;  b. Echo 7/13:  EF 45-50%, Gr 2 diast dysfn, mild AI, mild MAC, trivial MR, mild LAE, PASP 47.  [name] Chronic ulcer of leg ([place])    04-09-15 resolved-not a problem.  . Colon polyps 04/12/2013   Rectosigmoid polyp  . COPD (chronic obstructive pulmonary disease) (HCC)    O2 at night  . Depression   . Diabetes mellitus   . Eczema   . Elevated alkaline phosphatase level    GGT and 5'nucleotidase 8/13 normal  . History of oxygen administration    oxygen @ 2 l/m nasally bedtime 24/7  . HTN  (hypertension)   . Hx of cardiac cath    a. [place] 2003 normal;  b. LHC 6/13:  Mild calcification in the LM, o/w normal coronary arteries, EF 45%.   . Hyperlipidemia   . Hyperthyroidism, subclinical   . Implantable cardioverter-defibrillator (ICD) generator end of life   . Lipoma   . NICM (nonischemic cardiomyopathy) ([place])   . Obesity   . Post menopausal syndrome   . Sleep apnea    pt denies 04/12/2013    Patient Active Problem List   Diagnosis Date Noted  . COPD exacerbation ([place]) 10/11/2016  . Chronic combined systolic and diastolic congestive heart failure ([place]) 03/18/2016  . Nocturnal hypoxia 02/24/2016  . Spinal stenosis of lumbar region 12/09/2015  . COPD (chronic obstructive pulmonary disease) ([place]) 06/24/2015  . Abnormal CT of the abdomen 02/09/2015  . CAP (community acquired pneumonia) 08/08/2014  . Diabetic gastroparesis associated with type 2 diabetes mellitus ([place]) 07/12/2014  . Depression 05/22/2014  . Health care maintenance 01/22/2013  . Nonischemic cardiomyopathy ([place]) 04/03/2012  . Type 2 diabetes mellitus with stage 2 chronic kidney disease ([place]) 04/03/2012  . HYPERTHYROIDISM, SUBCLINICAL 05/06/2009  . Hyperlipidemia associated with type 2 diabetes mellitus ([place]) 05/25/2007  . HYPERTENSION, BENIGN ESSENTIAL 05/25/2007  . Automatic implantable cardioverter-defibrillator in situ 05/04/2007    Past Surgical History:  Procedure Laterality Date  . ABDOMINAL HYSTERECTOMY    . CARDIAC CATHETERIZATION    . CARDIAC CATHETERIZATION N/A  06/21/2016   Procedure: Left Heart Cath and Coronary Angiography;  Surgeon: Sherren Mocha, MD;  Location: [facility];  Service: Cardiovascular;  Laterality: N/A;  . CARDIAC DEFIBRILLATOR PLACEMENT  05/04/2007   SJM Atlas II VR ICD  . CARDIAC DEFIBRILLATOR PLACEMENT    . COLONOSCOPY N/A 04/12/2013   Procedure: COLONOSCOPY;  Surgeon: Beryle Beams, MD;  Location: [facility]COPY;  Service: Endoscopy;  Laterality: N/A;  pt.has  defibrilator  . COLONOSCOPY N/A 04/16/2015   Procedure: COLONOSCOPY;  Surgeon: [name], MD;  Location: [facility] ENDOSCOPY;  Service: Endoscopy;  Laterality: N/A;  . ESOPHAGOGASTRODUODENOSCOPY N/A 04/16/2015   Procedure: ESOPHAGOGASTRODUODENOSCOPY (EGD);  Surgeon: [name], MD;  Location: [facility];  Service: Endoscopy;  Laterality: N/A;  . HERNIA REPAIR    . HYSTEROSCOPY    . IMPLANTABLE CARDIOVERTER DEFIBRILLATOR (ICD) GENERATOR CHANGE N/A 04/02/2014   Procedure: ICD GENERATOR CHANGE;  Surgeon: Evans Lance, MD;  Location: [facility] CATH LAB;  Service: Cardiovascular;  Laterality: N/A;  . INSERT / REPLACE / REMOVE PACEMAKER    . LEFT HEART CATHETERIZATION WITH CORONARY ANGIOGRAM N/A 04/02/2012   Procedure: LEFT HEART CATHETERIZATION WITH CORONARY ANGIOGRAM;  Surgeon: Hillary Bow, MD;  Location: [facility] CATH LAB;  Service: Cardiovascular;  Laterality: N/A;  . TUBAL LIGATION      OB History    No data available       Home Medications    Prior to Admission medications   Medication Sig Start Date End Date Taking? Authorizing Provider  albuterol (PROVENTIL) (2.5 MG/3ML) 0.083% nebulizer solution USE 1 VIAL VIA NEBULIZER EVERY 6 HOURS AS NEEDED FOR WHEEZING OR SHORTNESS OF BREATH 11/13/15  Yes Alexa Angela Burke, MD  amLODipine-olmesartan (AZOR) 10-40 MG tablet Take 1 tablet by mouth daily. 08/31/16  Yes Alexa Angela Burke, MD  aspirin EC 81 MG tablet Take 1 tablet (81 mg total) by mouth daily. 11/13/15  Yes Alexa Angela Burke, MD  atorvastatin (LIPITOR) 80 MG tablet Take 1 tablet (80 mg total) by mouth at bedtime. 11/13/15  Yes Alexa Angela Burke, MD  Fluticasone-Salmeterol (ADVAIR DISKUS) 250-50 MCG/DOSE AEPB Inhale 1 puff into the lungs 2 (two) times daily. 04/06/16 04/06/17 Yes Holley Raring, MD  furosemide (LASIX) 20 MG tablet Take 3 tablets (60 mg total) by mouth 2 (two) times daily. 06/01/16  Yes Alexa Angela Burke, MD  glipiZIDE (GLUCOTROL) 5 MG tablet Take 1 tablet (5 mg total) by mouth 2 (two) times daily before  a meal. 06/01/16  Yes Alexa Angela Burke, MD  ibuprofen (ADVIL,MOTRIN) 200 MG tablet Take 400 mg by mouth every 6 (six) hours as needed for moderate pain.   Yes Historical Provider, MD  insulin glargine (LANTUS) 100 UNIT/ML injection Inject 35 Units into the skin at bedtime.   Yes Historical Provider, MD  insulin lispro (HUMALOG KWIKPEN) 100 UNIT/ML KiwkPen Take 7 units with breakfast, 7 units with lunch, and 5 units with dinner. 08/31/16  Yes Alexa Angela Burke, MD  metoprolol succinate (TOPROL-XL) 50 MG 24 hr tablet Take 1 tablet (50 mg total) by mouth daily. 08/31/16  Yes Alexa Angela Burke, MD  nitroGLYCERIN (NITROSTAT) 0.4 MG SL tablet Place 0.4 mg under the tongue every 5 (five) minutes as needed for chest pain. Reported on 03/18/2016   Yes Historical Provider, MD  tiotropium (SPIRIVA) 18 MCG inhalation capsule Place 1 capsule (18 mcg total) into inhaler and inhale daily. 11/13/15  Yes Alexa Angela Burke, MD  acetaminophen (TYLENOL) 325 MG tablet Take 2 tablets (650  mg total) by mouth every 6 (six) hours as needed. Patient not taking: Reported on 10/11/2016 06/01/16   Alexa Angela Burke, MD  metFORMIN (GLUCOPHAGE-XR) 500 MG 24 hr tablet Take 4 tablets (2,000 mg total) by mouth daily with breakfast. Patient not taking: Reported on 10/11/2016 12/09/15   Florinda Marker, MD    Family History Family History  Problem Relation Age of Onset  . Stroke Mother   . Seizures Father   . Heart disease Father   . Diabetes Sister   . Asthma Maternal Aunt     aunts  . Asthma Maternal Uncle     uncles  . Heart disease Paternal Aunt     aunts  . Heart disease Paternal Uncle     uncles  . Heart disease Maternal Aunt     aunts  . Heart disease Maternal Uncle     uncles  . Heart disease Maternal Grandfather   . Colon cancer Neg Hx   . Colon polyps Neg Hx   . Esophageal cancer Neg Hx   . Kidney disease Neg Hx   . Gallbladder disease Neg Hx     Social History Social History  Substance Use Topics  . Smoking status: Current Every  Day Smoker    Packs/day: 0.20    Years: 35.00    Types: Cigarettes  . Smokeless tobacco: Never Used  . Alcohol use No     Allergies   Actos [pioglitazone]; Naproxen; Rosiglitazone; and Hydrocortisone   Review of Systems Review of Systems 10/14 systems reviewed and are negative other than those stated in the HPI   Physical Exam Updated Vital Signs BP 124/82   Pulse (!) 125   Resp 20   Ht 5' (1.524 m)   Wt 228 lb (103.4 kg)   SpO2 95%   BMI 44.53 kg/m   Physical Exam Physical Exam  Nursing note and vitals reviewed. Constitutional: mild to moderate respiratory distress, non-toxic Head: Normocephalic and atraumatic.  Mouth/Throat: Oropharynx is clear and moist.  Neck: Normal range of motion. Neck supple.  Cardiovascular: tachycardic rate and regular rhythm.  trace bilateral edema Pulmonary/Chest: Tachypnea, rhonchi and expiratory wheezes in all lung fields Abdominal: Soft. Obese, mildly distended There is no tenderness. There is no rebound and no guarding.  Musculoskeletal: Normal range of motion.  Neurological: Alert, no facial droop, fluent speech, moves all extremities symmetrically Skin: Skin is warm and dry.  Psychiatric: Cooperative   ED Treatments / Results  Labs (all labs ordered are listed, but only abnormal results are displayed) Labs Reviewed  CBC WITH DIFFERENTIAL/PLATELET - Abnormal; Notable for the following:       Result Value   RBC 5.29 (*)    Hemoglobin 15.2 (*)    RDW 16.7 (*)    Platelets 144 (*)    All other components within normal limits  BASIC METABOLIC PANEL - Abnormal; Notable for the following:    Glucose, Bld 247 (*)    All other components within normal limits  BRAIN NATRIURETIC PEPTIDE - Abnormal; Notable for the following:    B Natriuretic Peptide 204.8 (*)    All other components within normal limits  INFLUENZA PANEL BY PCR (TYPE A & B, H1N1) - Abnormal; Notable for the following:    Influenza A By PCR POSITIVE (*)    All  other components within normal limits  I-STAT CG4 LACTIC ACID, ED - Abnormal; Notable for the following:    Lactic Acid, Venous 3.65 (*)    All  other components within normal limits  I-STAT CG4 LACTIC ACID, ED - Abnormal; Notable for the following:    Lactic Acid, Venous 3.65 (*)    All other components within normal limits  CULTURE, BLOOD (ROUTINE X 2)  CULTURE, BLOOD (ROUTINE X 2)  I-STAT TROPOININ, ED  I-STAT TROPOININ, ED  I-STAT CG4 LACTIC ACID, ED    EKG  EKG Interpretation  Date/Time:  Tuesday October 11 2016 10:05:26 EST Ventricular Rate:  136 PR Interval:    QRS Duration: 86 QT Interval:  321 QTC Calculation: 480 R Axis:   107 Text Interpretation:  Sinus tachycardia Ventricular premature complex Probable left atrial enlargement Right axis deviation Borderline T abnormalities, lateral leads TECHNICALLY DIFFICULT Confirmed by LIU MD, DANA [id number]) on 10/11/2016 10:23:33 AM       Radiology Dg Chest Portable 1 View  Result Date: 10/11/2016 CLINICAL DATA:  Shortness of breath, cough, fever EXAM: PORTABLE CHEST 1 VIEW COMPARISON:  02/11/2016 FINDINGS: Cardiomegaly is noted. Single lead cardiac pacemaker is unchanged in position. There is streaky infiltrate in right upper lobe and right infrahilar region. Findings suspicious for multifocal pneumonia. Follow-up to resolution is recommended. IMPRESSION: Cardiomegaly. Stable cardiac pacemaker position.There is streaky infiltrate in right upper lobe and right infrahilar region. Findings suspicious for multifocal pneumonia. Follow-up to resolution is recommended. Electronically Signed   By: Lahoma Crocker M.D.   On: 10/11/2016 11:06    Procedures Procedures (including critical care time)  Medications Ordered in ED Medications  oseltamivir (TAMIFLU) capsule 75 mg (not administered)  acetaminophen (TYLENOL) tablet 1,000 mg (1,000 mg Oral Given 10/11/16 1204)  cefTRIAXone (ROCEPHIN) 1 g in dextrose 5 % 50 mL IVPB (0 g Intravenous Stopped  10/11/16 1230)  azithromycin (ZITHROMAX) 500 mg in dextrose 5 % 250 mL IVPB (500 mg Intravenous New Bag/Given 10/11/16 1205)  albuterol (PROVENTIL) (2.5 MG/3ML) 0.083% nebulizer solution 5 mg (5 mg Nebulization Given 10/11/16 1206)  sodium chloride 0.9 % bolus 250 mL (250 mLs Intravenous New Bag/Given 10/11/16 1206)     Initial Impression / Assessment and Plan / ED Course  I have reviewed the triage vital signs and the nursing notes.  Pertinent labs & imaging results that were available during my care of the patient were reviewed by me and considered in my medical decision making (see chart for details).  Clinical Course    Presenting with concern for an acute COPD exacerbation with acute hypoxic respiratory failure, requiring 4-5 L nasal cannula. Does have fever 100.47F. With wheezing and rhonchi on lung exam, tachypnea and tachycardia, but able to speak in full sentences. Given additional breathing treatments and had RAD receive steroids from EMS. Chest x-ray shows questionable infiltrates suggestive of pneumonia, but also question potential edema. BNP 200s. Covered for CAP with ceftriaxone and azithromycin. Influenza A positive and given tamiflu. Discussed with hospitalist service, and plan to admit for ongoing management.  Final Clinical Impressions(s) / ED Diagnoses   Final diagnoses:  COPD (chronic obstructive pulmonary disease) with acute bronchitis ([place])  Influenza  Lobar pneumonia ([place])  Acute respiratory failure with hypoxia [facility])    New Prescriptions New Prescriptions   No medications on file     Forde Dandy, MD 10/11/16 1327

## 2016-10-11 NOTE — H&P (Signed)
History and Physical    CHERYLLYNN [name] ERX:[id number] DOB: 11/10/1950 DOA: 10/11/2016  PCP: Florinda Marker, MD Patient coming from:  Chief Complaint: increasing SOB, cough, myalgia  HPI: [name] is a 66 y.o. female with medical history significant of NICM with EF 20-25%, s/p AICD, COPD with chronic respiratory failure on O2 use around the [place] at home , PAF - not on anticoagulation therapy, DM type II, HTN, Hyperlipidemia who presented to the Ed with c/o subjective fevers, chills, worsening of SOB and myalgia with. She attended a funeral on Sunday (10/10/15) and few hours later started having muscle ache and runny nose with sneezing. She reported having subjective fevers, cough with white phlegm, loss of appetite with nausea, occasional vomiting and progressive SOB.  Usually she uses oxygen at 2L [place], but started increasing the flow of oxygen without relief.  Patient was unable to rest last night due to orthopnea and increasing work of breathing with wheezing, eventually today EMS was summoned to her residence. She was was found to be hypoxic with SpO2 88% on 2 L oxygen given few Nebulizer treatments, Magnesium and steroid - 125 mg of Solumedrol IV  ED Course: In the ED patient was febrile with temp of 103.4tachycardic with HR 122-135, tachypneic with RR 20-36BP was elevated to 204/120 mm Hg, her blood work showed elevated Lactic Acid - 3.65, but CBC showed normal WBC count, chest XRay revealed multifocal infiltrates (right upper lobe and right infrahilar region) Oxygen demand increased to 4-5 L O2 to maintain saturation above 90% Patient was placed on Rocephin and Zithromax in the ED.  Review of Systems: As per HPI otherwise 10 point review of systems negative.   Ambulatory Status: Independent  Past Medical History:  Diagnosis Date  . ATN (acute tubular necrosis) ([place]) 07/15/2014  . Automatic implantable cardioverter-defibrillator in situ   . Cardiac defibrillator in situ    Atlas II VR  (SJM) implanted by Dr Lovena Le  . CHF (congestive heart failure) ([place])   . Chronic combined systolic and diastolic heart failure (HCC)    a. EF 35-40% in past;  b. Echo 7/13:  EF 45-50%, Gr 2 diast dysfn, mild AI, mild MAC, trivial MR, mild LAE, PASP 47.  [name] Chronic ulcer of leg ([place])    04-09-15 resolved-not a problem.  . Colon polyps 04/12/2013   Rectosigmoid polyp  . COPD (chronic obstructive pulmonary disease) (HCC)    O2 at night  . Depression   . Diabetes mellitus   . Eczema   . Elevated alkaline phosphatase level    GGT and 5'nucleotidase 8/13 normal  . History of oxygen administration    oxygen @ 2 l/m nasally bedtime 24/7  . HTN (hypertension)   . Hx of cardiac cath    a. [place] 2003 normal;  b. [facility] 6/13:  Mild calcification in the LM, o/w normal coronary arteries, EF 45%.   . Hyperlipidemia   . Hyperthyroidism, subclinical   . Implantable cardioverter-defibrillator (ICD) generator end of life   . Lipoma   . NICM (nonischemic cardiomyopathy) ([place])   . Obesity   . Post menopausal syndrome   . Sleep apnea    pt denies 04/12/2013    Past Surgical History:  Procedure Laterality Date  . ABDOMINAL HYSTERECTOMY    . CARDIAC CATHETERIZATION    . CARDIAC CATHETERIZATION N/A 06/21/2016   Procedure: Left Heart Cath and Coronary Angiography;  Surgeon: Sherren Mocha, MD;  Location: [facility];  Service: Cardiovascular;  Laterality: N/A;  . CARDIAC DEFIBRILLATOR PLACEMENT  05/04/2007   SJM Atlas II VR ICD  . CARDIAC DEFIBRILLATOR PLACEMENT    . COLONOSCOPY N/A 04/12/2013   Procedure: COLONOSCOPY;  Surgeon: Beryle Beams, MD;  Location: [facility];  Service: Endoscopy;  Laterality: N/A;  pt.has defibrilator  . COLONOSCOPY N/A 04/16/2015   Procedure: COLONOSCOPY;  Surgeon: [name], MD;  Location: [facility];  Service: Endoscopy;  Laterality: N/A;  . ESOPHAGOGASTRODUODENOSCOPY N/A 04/16/2015   Procedure: ESOPHAGOGASTRODUODENOSCOPY (EGD);  Surgeon: [name], MD;   Location: [facility];  Service: Endoscopy;  Laterality: N/A;  . HERNIA REPAIR    . HYSTEROSCOPY    . IMPLANTABLE CARDIOVERTER DEFIBRILLATOR (ICD) GENERATOR CHANGE N/A 04/02/2014   Procedure: ICD GENERATOR CHANGE;  Surgeon: Evans Lance, MD;  Location: [facility] C[facility] LAB;  Service: Cardiovascular;  Laterality: N/A;  . INSERT / REPLACE / REMOVE PACEMAKER    . LEFT HEART CATHETERIZATION WITH CORONARY ANGIOGRAM N/A 04/02/2012   Procedure: LEFT HEART CATHETERIZATION WITH CORONARY ANGIOGRAM;  Surgeon: Hillary Bow, MD;  Location: [facility] CATH LAB;  Service: Cardiovascular;  Laterality: N/A;  . TUBAL LIGATION      Social History   Social History  . Marital status: Widowed    Spouse name: Alroy Dust  . Number of children: 3  . Years of education: 33   Occupational History  . disabled    Social History Main Topics  . Smoking status: Current Every Day Smoker    Packs/day: 0.20    Years: 35.00    Types: Cigarettes  . Smokeless tobacco: Never Used  . Alcohol use No  . Drug use: No  . Sexual activity: Not Currently   Other Topics Concern  . Not on file   Social History Narrative   ** Merged History Encounter **       Married    Allergies  Allergen Reactions  . Actos [Pioglitazone] Other (See Comments)    REACTION: congestive heart failure   . Naproxen Other (See Comments)    525m dose made her sleep for two days  . Rosiglitazone Hives  . Hydrocortisone Other (See Comments)    unknown    Family History  Problem Relation Age of Onset  . Stroke Mother   . Seizures Father   . Heart disease Father   . Diabetes Sister   . Asthma Maternal Aunt     aunts  . Asthma Maternal Uncle     uncles  . Heart disease Paternal Aunt     aunts  . Heart disease Paternal Uncle     uncles  . Heart disease Maternal Aunt     aunts  . Heart disease Maternal Uncle     uncles  . Heart disease Maternal Grandfather   . Colon cancer Neg Hx   . Colon polyps Neg Hx   . Esophageal cancer Neg Hx   .  Kidney disease Neg Hx   . Gallbladder disease Neg Hx     Prior to Admission medications   Medication Sig Start Date End Date Taking? Authorizing Provider  albuterol (PROVENTIL) (2.5 MG/3ML) 0.083% nebulizer solution USE 1 VIAL VIA NEBULIZER EVERY 6 HOURS AS NEEDED FOR WHEEZING OR SHORTNESS OF BREATH 11/13/15  Yes Alexa RAngela Burke MD  amLODipine-olmesartan (AZOR) 10-40 MG tablet Take 1 tablet by mouth daily. 08/31/16  Yes Alexa RAngela Burke MD  aspirin EC 81 MG tablet Take 1 tablet (81 mg total) by mouth daily. 11/13/15  Yes Alexa RAngela Burke  MD  atorvastatin (LIPITOR) 80 MG tablet Take 1 tablet (80 mg total) by mouth at bedtime. 11/13/15  Yes Alexa Angela Burke, MD  Fluticasone-Salmeterol (ADVAIR DISKUS) 250-50 MCG/DOSE AEPB Inhale 1 puff into the lungs 2 (two) times daily. 04/06/16 04/06/17 Yes Holley Raring, MD  furosemide (LASIX) 20 MG tablet Take 3 tablets (60 mg total) by mouth 2 (two) times daily. 06/01/16  Yes Alexa Angela Burke, MD  glipiZIDE (GLUCOTROL) 5 MG tablet Take 1 tablet (5 mg total) by mouth 2 (two) times daily before a meal. 06/01/16  Yes Alexa Angela Burke, MD  ibuprofen (ADVIL,MOTRIN) 200 MG tablet Take 400 mg by mouth every 6 (six) hours as needed for moderate pain.   Yes Historical Provider, MD  insulin glargine (LANTUS) 100 UNIT/ML injection Inject 35 Units into the skin at bedtime.   Yes Historical Provider, MD  insulin lispro (HUMALOG KWIKPEN) 100 UNIT/ML KiwkPen Take 7 units with breakfast, 7 units with lunch, and 5 units with dinner. 08/31/16  Yes Alexa Angela Burke, MD  metoprolol succinate (TOPROL-XL) 50 MG 24 hr tablet Take 1 tablet (50 mg total) by mouth daily. 08/31/16  Yes Alexa Angela Burke, MD  nitroGLYCERIN (NITROSTAT) 0.4 MG SL tablet Place 0.4 mg under the tongue every 5 (five) minutes as needed for chest pain. Reported on 03/18/2016   Yes Historical Provider, MD  tiotropium (SPIRIVA) 18 MCG inhalation capsule Place 1 capsule (18 mcg total) into inhaler and inhale daily. 11/13/15  Yes Alexa Angela Burke, MD    acetaminophen (TYLENOL) 325 MG tablet Take 2 tablets (650 mg total) by mouth every 6 (six) hours as needed. Patient not taking: Reported on 10/11/2016 06/01/16   Alexa Angela Burke, MD  metFORMIN (GLUCOPHAGE-XR) 500 MG 24 hr tablet Take 4 tablets (2,000 mg total) by mouth daily with breakfast. Patient not taking: Reported on 10/11/2016 12/09/15   Florinda Marker, MD    Physical Exam: Vitals:   10/11/16 1130 10/11/16 1145 10/11/16 1200 10/11/16 1230  BP: 164/84  162/76 124/82  Pulse: (!) 126 (!) 125 (!) 122 (!) 125  Resp: (!) 32 (!) 29 (!) 29 20  SpO2: 94% 96% 94% 95%  Weight:      Height:         General: Appears calm and comfortable now on 5L O2 [place] Eyes: PERRLA, EOMI, normal lids, iris ENT:  grossly normal hearing, lips & tongue, mucous membranes moist and intact Neck: no lymphoadenopathy, masses or thyromegaly Cardiovascular: RRR, no m/r/g. No JVD, carotid bruits. No LE edema.  Respiratory: bilateral wheezes, rales,  And rhonchi. Normal respiratory effort. No accessory muscle use observed Abdomen: soft, non-tender, non-distended, no organomegaly or masses appreciated. BS present in all quadrants Skin: no rash, ulcers or induration seen on limited exam Musculoskeletal: grossly normal tone BUE/BLE, good ROM, no bony abnormality or joint deformities observed Psychiatric: grossly normal mood and affect, speech fluent and appropriate, alert and oriented x3 Neurologic: CN II-XII grossly intact, moves all extremities in coordinated fashion, sensation intact  Labs on Admission: I have personally reviewed following labs and imaging studies  CBC, BMP, Influenza PCR  GFR: Estimated Creatinine Clearance: 74.3 mL/min (by C-G formula based on SCr of 0.84 mg/dL).   Creatinine Clearance: Estimated Creatinine Clearance: 74.3 mL/min (by C-G formula based on SCr of 0.84 mg/dL).    Radiological Exams on Admission: Dg Chest Portable 1 View  Result Date: 10/11/2016 CLINICAL DATA:  Shortness of breath,  cough, fever EXAM: PORTABLE CHEST 1 VIEW COMPARISON:  02/11/2016 FINDINGS:  Cardiomegaly is noted. Single lead cardiac pacemaker is unchanged in position. There is streaky infiltrate in right upper lobe and right infrahilar region. Findings suspicious for multifocal pneumonia. Follow-up to resolution is recommended. IMPRESSION: Cardiomegaly. Stable cardiac pacemaker position.There is streaky infiltrate in right upper lobe and right infrahilar region. Findings suspicious for multifocal pneumonia. Follow-up to resolution is recommended. Electronically Signed   By: Lahoma Crocker M.D.   On: 10/11/2016 11:06    EKG: Independently reviewed - no new acute changes  Assessment/Plan Principal Problem:   CAP (community acquired pneumonia) Active Problems:   Hyperlipidemia associated with type 2 diabetes mellitus ([place])   HYPERTENSION, BENIGN ESSENTIAL   Automatic implantable cardioverter-defibrillator in situ   Type 2 diabetes mellitus with stage 2 chronic kidney disease (HCC)   COPD exacerbation (HCC)   Chronic respiratory failure with hypoxia ([place])   Sepsis associated with pneumonia Start IV hydration and follow lactic acid, which increased 3.65-->5.17 Monitor fluid volume closely as patient has low EF of 20% and is at risk of developing CHF exacerbation Treat underlying cause   Influenza complicated by pneumonia Start Tamiflu,  discontinue Zithromax and Rocephin Continue Solumedrol, nebulize treatments, Attempt to wean supplemental Oxygen to baseline as patient condition allows Check Procalcitonin   NICM with EF 20-25%  Start IV diuretic given active IV hydration per sepsis protocol  HTN with Hypertensive urgency on admission Currently BP improved to 123/59 mmHg Most likely associated with the stress of breathing  DM type II Will hold oral meds , continue insulin home doses and add SSI, carb modified diet and monitor CBG's  DVT prophylaxis: Heparin Code Status: full Family Communication:  none Disposition Plan: telemetry Consults called: none Admission status: inpatient   York Grice, [place] Pager: [phone] [facility]  If 7PM-7AM, please contact night-coverage [email or web address] Password [email or web address]  10/11/2016, 1:27 PM

## 2016-10-11 NOTE — ED Notes (Signed)
2 bed bugs found on pt, both obtained and stored in cup. Pt's belongings taken off, double bagged. EVS and charge made aware

## 2016-10-11 NOTE — ED Triage Notes (Signed)
Pt in from home via [facility] with COPD exac, cough since yesterday. Per EMS, pt wears 2L [place] baseline, was 88% on their arrival. EMS gave Duoneb, Solumedrol and Mag. Hx of afib, chf, copd. Expiratory wheezes noted on ED arrival. A&ox4, speaks in short sentences

## 2016-10-12 DIAGNOSIS — J9621 Acute and chronic respiratory failure with hypoxia: Secondary | ICD-10-CM

## 2016-10-12 DIAGNOSIS — Z794 Long term (current) use of insulin: Secondary | ICD-10-CM

## 2016-10-12 DIAGNOSIS — J189 Pneumonia, unspecified organism: Secondary | ICD-10-CM

## 2016-10-12 DIAGNOSIS — Z95810 Presence of automatic (implantable) cardiac defibrillator: Secondary | ICD-10-CM

## 2016-10-12 DIAGNOSIS — E119 Type 2 diabetes mellitus without complications: Secondary | ICD-10-CM

## 2016-10-12 DIAGNOSIS — J1100 Influenza due to unidentified influenza virus with unspecified type of pneumonia: Secondary | ICD-10-CM

## 2016-10-12 LAB — BASIC METABOLIC PANEL
Anion gap: 9 (ref 5–15)
BUN: 20 mg/dL (ref 6–20)
CO2: 25 mmol/L (ref 22–32)
Calcium: 8.3 mg/dL — ABNORMAL LOW (ref 8.9–10.3)
Chloride: 102 mmol/L (ref 101–111)
Creatinine, Ser: 0.87 mg/dL (ref 0.44–1.00)
GFR calc Af Amer: >60 mL/min (ref >60)
GFR calc non Af Amer: >60 mL/min (ref >60)
Glucose, Bld: 320 mg/dL — ABNORMAL HIGH (ref 65–99)
Potassium: 5 mmol/L (ref 3.5–5.1)
Sodium: 136 mmol/L (ref 135–145)

## 2016-10-12 LAB — CBC
HCT: 44 % (ref 36.0–46.0)
Hemoglobin: 14 g/dL (ref 12.0–15.0)
MCH: 27.8 pg (ref 26.0–34.0)
MCHC: 31.8 g/dL (ref 30.0–36.0)
MCV: 87.3 fL (ref 78.0–100.0)
Platelets: 144 10*3/uL — ABNORMAL LOW (ref 150–400)
RBC: 5.04 MIL/uL (ref 3.87–5.11)
RDW: 16.8 % — ABNORMAL HIGH (ref 11.5–15.5)
WBC: 10.7 10*3/uL — ABNORMAL HIGH (ref 4.0–10.5)

## 2016-10-12 LAB — GLUCOSE, CAPILLARY
Glucose-Capillary: 259 mg/dL — ABNORMAL HIGH (ref 65–99)
Glucose-Capillary: 284 mg/dL — ABNORMAL HIGH (ref 65–99)
Glucose-Capillary: 357 mg/dL — ABNORMAL HIGH (ref 65–99)
Glucose-Capillary: 398 mg/dL — ABNORMAL HIGH (ref 65–99)
Glucose-Capillary: 446 mg/dL — ABNORMAL HIGH (ref 65–99)
Glucose-Capillary: 513 mg/dL (ref 65–99)
Glucose-Capillary: 541 mg/dL (ref 65–99)

## 2016-10-12 LAB — BLOOD CULTURE ID PANEL (REFLEXED)

## 2016-10-12 LAB — LACTIC ACID, PLASMA: Lactic Acid, Venous: 2.7 mmol/L (ref 0.5–1.9)

## 2016-10-12 MED ORDER — DOXYCYCLINE HYCLATE 100 MG PO TABS
100.0000 mg | ORAL_TABLET | Freq: Two times a day (BID) | ORAL | Status: DC
  Administered 2016-10-12 – 2016-10-14 (×5): 100 mg via ORAL
  Filled 2016-10-12 (×5): qty 1

## 2016-10-12 MED ORDER — INSULIN ASPART 100 UNIT/ML ~~LOC~~ SOLN
9.0000 [IU] | Freq: Once | SUBCUTANEOUS | Status: AC
  Administered 2016-10-12: 9 [IU] via SUBCUTANEOUS

## 2016-10-12 MED ORDER — GLUCERNA SHAKE PO LIQD
237.0000 mL | Freq: Three times a day (TID) | ORAL | Status: DC
  Administered 2016-10-12 – 2016-10-14 (×4): 237 mL via ORAL

## 2016-10-12 NOTE — Progress Notes (Addendum)
CRITICAL VALUE STICKER  CRITICAL VALUE: CBG [id number]   RECEIVER (on-site recipient of call):  [place] NOTIFIED: 1745  MESSENGER (representative from lab):  MD NOTIFIED: Dr. Erlinda Hong   TIME OF NOTIFICATION:1746  RESPONSE: Administer 16 units of insulin according to sliding scale and meal coverage.

## 2016-10-12 NOTE — Progress Notes (Signed)
CRITICAL VALUE STICKER  CRITICAL VALUE:CBG [id number]  RECEIVER (on-site recipient of call): Emilio Math, RN   DATE & TIME NOTIFIED: [place] (representative from lab):  MD NOTIFIED: Dr. Erlinda Hong   TIME OF NOTIFICATION:1846  RESPONSE: One time dose of 9 units of Novolog insulin. Awaiting pharmacy approval.

## 2016-10-12 NOTE — Progress Notes (Signed)
PHARMACY - PHYSICIAN COMMUNICATION CRITICAL VALUE ALERT - BLOOD CULTURE IDENTIFICATION (BCID)  Results for orders placed or performed during the hospital encounter of 10/11/16  Blood Culture ID Panel (Reflexed) (Collected: 10/11/2016 10:40 AM)  Result Value Ref Range   Enterococcus species NOT DETECTED NOT DETECTED   Listeria monocytogenes NOT DETECTED NOT DETECTED   Staphylococcus species DETECTED (A) NOT DETECTED   Staphylococcus aureus NOT DETECTED NOT DETECTED   Methicillin resistance NOT DETECTED NOT DETECTED   Streptococcus species NOT DETECTED NOT DETECTED   Streptococcus agalactiae NOT DETECTED NOT DETECTED   Streptococcus pneumoniae NOT DETECTED NOT DETECTED   Streptococcus pyogenes NOT DETECTED NOT DETECTED   Acinetobacter baumannii NOT DETECTED NOT DETECTED   Enterobacteriaceae species NOT DETECTED NOT DETECTED   Enterobacter cloacae complex NOT DETECTED NOT DETECTED   Escherichia coli NOT DETECTED NOT DETECTED   Klebsiella oxytoca NOT DETECTED NOT DETECTED   Klebsiella pneumoniae NOT DETECTED NOT DETECTED   Proteus species NOT DETECTED NOT DETECTED   Serratia marcescens NOT DETECTED NOT DETECTED   Haemophilus influenzae NOT DETECTED NOT DETECTED   Neisseria meningitidis NOT DETECTED NOT DETECTED   Pseudomonas aeruginosa NOT DETECTED NOT DETECTED   Candida albicans NOT DETECTED NOT DETECTED   Candida glabrata NOT DETECTED NOT DETECTED   Candida krusei NOT DETECTED NOT DETECTED   Candida parapsilosis NOT DETECTED NOT DETECTED   Candida tropicalis NOT DETECTED NOT DETECTED    Name of physician (or Provider) Contacted: Dr. Erlinda Hong  Changes to prescribed antibiotics required: none, as likely contaminant   Duayne Cal 10/12/2016  10:11 AM

## 2016-10-12 NOTE — Progress Notes (Signed)
Initial Nutrition Assessment  DOCUMENTATION CODES:   Morbid obesity  INTERVENTION:  Discontinue Ensure.   Provide Glucerna Shake po TID, each supplement provides 220 kcal and 10 grams of protein.  Encourage adequate PO intake.   NUTRITION DIAGNOSIS:   Inadequate oral intake related to poor appetite as evidenced by per patient/family report.  GOAL:   Patient will meet greater than or equal to 90% of their needs  MONITOR:   PO intake, Supplement acceptance, Labs, Weight trends, Skin, I & O's  REASON FOR ASSESSMENT:   Malnutrition Screening Tool    ASSESSMENT:   66 y.o. female with medical history significant of NICM with EF 20-25%, s/p AICD, COPD with chronic respiratory failure on O2 use around the [place] at home , PAF - not on anticoagulation therapy, DM type II, HTN, Hyperlipidemia who presented to the Ed with c/o subjective fevers, chills, worsening of SOB and myalgia   Pt reports having a decreased appetite. Meal completion 25% this AM. Pt reports eating well PTA with usual consumption of at least 3 meals a day. Weight has been stable. Noted Ensure has been ordered and pt has been consuming them. RN contacted RD regarding elevated CBG's and Ensure being ordered. RD to modify order and order Glucerna Shake instead. Pt encouraged to continue with nutritional supplements at home if po intake continues to be poor.   Pt with no observed significant fat or muscle mass loss.   Labs and medications reviewed. CBGs 259-360 mg/dL.   Diet Order:  Diet Heart Room service appropriate? Yes; Fluid consistency: Thin Diet heart healthy/carb modified Room service appropriate? Yes; Fluid consistency: Thin  Skin:  Reviewed, no issues  Last BM:  Unknown  Height:   Ht Readings from Last 1 Encounters:  10/11/16 5' (1.524 m)    Weight:   Wt Readings from Last 1 Encounters:  10/11/16 233 lb (105.7 kg)    Ideal Body Weight:  45.45 kg  BMI:  Body mass index is 45.5  kg/m.  Estimated Nutritional Needs:   Kcal:  1700-1900  Protein:  100-110 grams  Fluid:  1.7 - 1.9 L/day  EDUCATION NEEDS:   No education needs identified at this time  Peggy Parker, MS, RD, LDN Pager # [phone] After hours/ weekend pager # [phone]

## 2016-10-12 NOTE — Progress Notes (Signed)
Paged Dr. Erlinda Hong, pt's CBG 513. Awaiting orders.  Paulla Fore, RN

## 2016-10-12 NOTE — Progress Notes (Signed)
PROGRESS NOTE  [name] MEQ:[id number] DOB: 25-Nov-1950 DOA: 10/11/2016 PCP: Florinda Marker, MD  HPI/Recap of past 24 hours:  Feeling less sob, no fever, denies chest pain, no edema Blood sugar elevated Daughter at  Bedside  Daughter in room Assessment/Plan: Principal Problem:   CAP (community acquired pneumonia) Active Problems:   Hyperlipidemia associated with type 2 diabetes mellitus ([place])   HYPERTENSION, BENIGN ESSENTIAL   Automatic implantable cardioverter-defibrillator in situ   Type 2 diabetes mellitus with stage 2 chronic kidney disease (HCC)   COPD exacerbation (HCC)   Chronic respiratory failure with hypoxia (HCC)   Sepsis associated with influenza/pneumonia Acute on chronic hypoxic respiratory failure,  With significant sinus tachycardia, tachypnea, lactic acidosis , increase oxygen requirement on presentation s/p IV hydration on admission, lactic acid trending down ( peaked at  5.17) D/c ivf on 1/10  as patient has low EF of 20% and is at risk of developing CHF exacerbation Treat underlying cause   Influenza complicated by pneumonia Start Tamiflu,  discontinue Zithromax and Rocephin, change to oral doxycycline Continue Solumedrol, nebulize treatments, Attempt to wean supplemental Oxygen to baseline as patient condition allows 0.18 Procalcitonin   NICM with EF 20-25%  Start IV diuretic given active IV hydration per sepsis protocol D/c ivf on 1/10, continue hold lasix  HTN with Hypertensive urgency on admission Currently BP improved to 123/59 mmHg Most likely associated with the stress of breathing  Insulin dependent DM type II Will hold oral meds , continue insulin home doses and add SSI, carb modified diet and monitor CBG's Blood sugar elevated, partially due to steroids, titrate insulin, taper steroids   Morbid obesity: Body mass index is 45.5 kg/m.   DVT prophylaxis: Heparin Code Status: full Family Communication: patient and daughter at  bedside Disposition Plan: home in a few days, she report previously independent Consults called: none   Procedures:  none  Antibiotics:  doxycycline   Objective: BP 132/71 (BP Location: Left Arm)   Pulse 80   Temp 97.9 F (36.6 C) (Oral)   Resp 14   Ht 5' (1.524 m)   Wt 105.7 kg (233 lb)   SpO2 93%   BMI 45.50 kg/m   Intake/Output Summary (Last 24 hours) at 10/12/16 1100 Last data filed at 10/12/16 0937  Gross per 24 hour  Intake              910 ml  Output              350 ml  Net              560 ml   Filed Weights   10/11/16 1013 10/11/16 2039  Weight: 103.4 kg (228 lb) 105.7 kg (233 lb)    Exam:   General:  NAD, obses  Cardiovascular: RRR  Respiratory: some scattered wheezing  Abdomen: Soft/ND/NT, positive BS  Musculoskeletal: No Edema  Neuro: aaox3  Data Reviewed: Basic Metabolic Panel:  Recent Labs Lab 10/11/16 1025 10/11/16 1525 10/12/16 0620  NA 140 136 136  K 3.7 3.9 5.0  CL 103 99* 102  CO2 22 25 25   GLUCOSE 247* 460* 320*  BUN 12 14 20   CREATININE 0.84 1.02* 0.87  CALCIUM 9.0 8.4* 8.3*   Liver Function Tests:  Recent Labs Lab 10/11/16 1525  AST 29  ALT 20  ALKPHOS 117  BILITOT 0.5  PROT 6.8  ALBUMIN 3.6   No results for input(s): LIPASE, AMYLASE in the last 168 hours. No results for  input(s): AMMONIA in the last 168 hours. CBC:  Recent Labs Lab 10/11/16 1025 10/11/16 1525 10/12/16 0620  WBC 8.6 6.4 10.7*  NEUTROABS 6.5 6.2  --   HGB 15.2* 13.9 14.0  HCT 45.6 44.1 44.0  MCV 86.2 87.7 87.3  PLT 144* 148* 144*   Cardiac Enzymes:   No results for input(s): CKTOTAL, CKMB, CKMBINDEX, TROPONINI in the last 168 hours. BNP (last 3 results)  Recent Labs  01/03/16 0506 02/11/16 0005 10/11/16 1025  BNP 35.5 25.0 204.8*    ProBNP (last 3 results) No results for input(s): PROBNP in the last 8760 hours.  CBG:  Recent Labs Lab 10/11/16 1727 10/11/16 1909 10/11/16 2033 10/12/16 0005 10/12/16 0803    GLUCAP 447* 425* 360* 259* 284*    Recent Results (from the past 240 hour(s))  Blood culture (routine x 2)     Status: None (Preliminary result)   Collection Time: 10/11/16 10:35 AM  Result Value Ref Range Status   Specimen Description BLOOD RIGHT HAND  Final   Special Requests IN PEDIATRIC BOTTLE 4CC  Final   Culture NO GROWTH < 12 HOURS  Final   Report Status PENDING  Incomplete  Blood culture (routine x 2)     Status: None (Preliminary result)   Collection Time: 10/11/16 10:40 AM  Result Value Ref Range Status   Specimen Description BLOOD LEFT HAND  Final   Special Requests IN PEDIATRIC BOTTLE 4CC  Final   Culture  Setup Time   Final    GRAM POSITIVE COCCI IN CLUSTERS IN PEDIATRIC BOTTLE Organism ID to follow CRITICAL RESULT CALLED TO, READ BACK BY AND VERIFIED WITH: [facility] [phone] BY J FUDESCO    Culture NO GROWTH < 12 HOURS  Final   Report Status PENDING  Incomplete  Blood Culture ID Panel (Reflexed)     Status: Abnormal   Collection Time: 10/11/16 10:40 AM  Result Value Ref Range Status   Enterococcus species NOT DETECTED NOT DETECTED Final   Listeria monocytogenes NOT DETECTED NOT DETECTED Final   Staphylococcus species DETECTED (A) NOT DETECTED Final    Comment: CRITICAL RESULT CALLED TO, READ BACK BY AND VERIFIED WITH: [facility] [phone] J FUDESCO    Staphylococcus aureus NOT DETECTED NOT DETECTED Final   Methicillin resistance NOT DETECTED NOT DETECTED Final   Streptococcus species NOT DETECTED NOT DETECTED Final   Streptococcus agalactiae NOT DETECTED NOT DETECTED Final   Streptococcus pneumoniae NOT DETECTED NOT DETECTED Final   Streptococcus pyogenes NOT DETECTED NOT DETECTED Final   Acinetobacter baumannii NOT DETECTED NOT DETECTED Final   Enterobacteriaceae species NOT DETECTED NOT DETECTED Final   Enterobacter cloacae complex NOT DETECTED NOT DETECTED Final   Escherichia coli NOT DETECTED NOT DETECTED Final   Klebsiella oxytoca NOT  DETECTED NOT DETECTED Final   Klebsiella pneumoniae NOT DETECTED NOT DETECTED Final   Proteus species NOT DETECTED NOT DETECTED Final   Serratia marcescens NOT DETECTED NOT DETECTED Final   Haemophilus influenzae NOT DETECTED NOT DETECTED Final   Neisseria meningitidis NOT DETECTED NOT DETECTED Final   Pseudomonas aeruginosa NOT DETECTED NOT DETECTED Final   Candida albicans NOT DETECTED NOT DETECTED Final   Candida glabrata NOT DETECTED NOT DETECTED Final   Candida krusei NOT DETECTED NOT DETECTED Final   Candida parapsilosis NOT DETECTED NOT DETECTED Final   Candida tropicalis NOT DETECTED NOT DETECTED Final     Studies: Dg Chest Port 1 View  Result Date: 10/11/2016 CLINICAL DATA:  Fever, chills, increasing shortness of breath associated with productive cough, muscle aches, sneezing and runny nose. Possible fevers. Decreased appetite with nausea and occasional vomiting. History of COPD on home oxygen, diabetes, cardiac dysrhythmia as with defibrillator placement. Current smoker. EXAM: PORTABLE CHEST 1 VIEW COMPARISON:  Portable chest x-ray of October 11, 2016 at 10:41 a.m. FINDINGS: The lungs are adequately inflated. The pulmonary interstitial markings seen on the earlier study today in the right upper and lower lobe remain increased. There is no pleural effusion. The cardiac silhouette is enlarged. The central pulmonary vascularity is prominent but there is no definite cephalization of the vascular pattern. The ICD is in stable position. IMPRESSION: Slight interval worsening of the appearance of the pulmonary interstitium on the right consistent with pneumonia. Stable cardiomegaly without definite pulmonary edema. Electronically Signed   By: David  Martinique M.D.   On: 10/11/2016 17:28   Dg Chest Portable 1 View  Result Date: 10/11/2016 CLINICAL DATA:  Shortness of breath, cough, fever EXAM: PORTABLE CHEST 1 VIEW COMPARISON:  02/11/2016 FINDINGS: Cardiomegaly is noted. Single lead cardiac pacemaker  is unchanged in position. There is streaky infiltrate in right upper lobe and right infrahilar region. Findings suspicious for multifocal pneumonia. Follow-up to resolution is recommended. IMPRESSION: Cardiomegaly. Stable cardiac pacemaker position.There is streaky infiltrate in right upper lobe and right infrahilar region. Findings suspicious for multifocal pneumonia. Follow-up to resolution is recommended. Electronically Signed   By: Lahoma Crocker M.D.   On: 10/11/2016 11:06    Scheduled Meds: . amLODipine  5 mg Oral Daily   And  . irbesartan  300 mg Oral Daily  . aspirin EC  81 mg Oral Daily  . atorvastatin  80 mg Oral QHS  . feeding supplement (ENSURE ENLIVE)  237 mL Oral BID BM  . furosemide  60 mg Intravenous BID  . heparin  5,000 Units Subcutaneous Q8H  . insulin aspart  0-9 Units Subcutaneous TID WC  . insulin aspart  7 Units Subcutaneous BID WC  . insulin glargine  35 Units Subcutaneous QHS  . ipratropium-albuterol  3 mL Nebulization QID  . methylPREDNISolone (SOLU-MEDROL) injection  60 mg Intravenous Q12H  . metoprolol succinate  50 mg Oral Daily  . oseltamivir  75 mg Oral BID  . sodium chloride  250 mL Intravenous Once  . sodium chloride  500 mL Intravenous Once  . sodium chloride flush  3 mL Intravenous Q12H    Continuous Infusions:   Time spent: 53mins  Idriss Quackenbush MD, PhD  [facility] Pager [phone]. If 7PM-7AM, please contact night-coverage at [email or web address], password [facility] 10/12/2016, 11:00 AM  LOS: 1 day

## 2016-10-13 ENCOUNTER — Inpatient Hospital Stay (HOSPITAL_COMMUNITY): Payer: Medicare Other

## 2016-10-13 DIAGNOSIS — I10 Essential (primary) hypertension: Secondary | ICD-10-CM

## 2016-10-13 DIAGNOSIS — J101 Influenza due to other identified influenza virus with other respiratory manifestations: Secondary | ICD-10-CM

## 2016-10-13 DIAGNOSIS — E875 Hyperkalemia: Secondary | ICD-10-CM

## 2016-10-13 LAB — CBC
HCT: 44.7 % (ref 36.0–46.0)
Hemoglobin: 14.1 g/dL (ref 12.0–15.0)
MCH: 27.5 pg (ref 26.0–34.0)
MCHC: 31.5 g/dL (ref 30.0–36.0)
MCV: 87.3 fL (ref 78.0–100.0)
Platelets: 175 10*3/uL (ref 150–400)
RBC: 5.12 MIL/uL — ABNORMAL HIGH (ref 3.87–5.11)
RDW: 16.6 % — ABNORMAL HIGH (ref 11.5–15.5)
WBC: 19.5 10*3/uL — ABNORMAL HIGH (ref 4.0–10.5)

## 2016-10-13 LAB — BASIC METABOLIC PANEL
Anion gap: 9 (ref 5–15)
BUN: 35 mg/dL — ABNORMAL HIGH (ref 6–20)
CO2: 28 mmol/L (ref 22–32)
Calcium: 8.7 mg/dL — ABNORMAL LOW (ref 8.9–10.3)
Chloride: 101 mmol/L (ref 101–111)
Creatinine, Ser: 1.06 mg/dL — ABNORMAL HIGH (ref 0.44–1.00)
GFR calc Af Amer: >60 mL/min (ref >60)
GFR calc non Af Amer: 55 mL/min — ABNORMAL LOW (ref >60)
Glucose, Bld: 206 mg/dL — ABNORMAL HIGH (ref 65–99)
Potassium: 5.2 mmol/L — ABNORMAL HIGH (ref 3.5–5.1)
Sodium: 138 mmol/L (ref 135–145)

## 2016-10-13 LAB — GLUCOSE, CAPILLARY
Glucose-Capillary: 207 mg/dL — ABNORMAL HIGH (ref 65–99)
Glucose-Capillary: 332 mg/dL — ABNORMAL HIGH (ref 65–99)
Glucose-Capillary: 364 mg/dL — ABNORMAL HIGH (ref 65–99)
Glucose-Capillary: 380 mg/dL — ABNORMAL HIGH (ref 65–99)

## 2016-10-13 LAB — LACTIC ACID, PLASMA: Lactic Acid, Venous: 1.2 mmol/L (ref 0.5–1.9)

## 2016-10-13 LAB — MAGNESIUM: Magnesium: 2.3 mg/dL (ref 1.7–2.4)

## 2016-10-13 LAB — PROCALCITONIN: Procalcitonin: 0.2 ng/mL

## 2016-10-13 MED ORDER — IPRATROPIUM-ALBUTEROL 0.5-2.5 (3) MG/3ML IN SOLN
3.0000 mL | Freq: Two times a day (BID) | RESPIRATORY_TRACT | Status: DC
  Administered 2016-10-13 – 2016-10-14 (×2): 3 mL via RESPIRATORY_TRACT
  Filled 2016-10-13 (×2): qty 3

## 2016-10-13 MED ORDER — SODIUM POLYSTYRENE SULFONATE 15 GM/60ML PO SUSP
15.0000 g | Freq: Once | ORAL | Status: AC
  Administered 2016-10-13: 15 g via ORAL
  Filled 2016-10-13: qty 60

## 2016-10-13 NOTE — Progress Notes (Signed)
PROGRESS NOTE  [name] FXO:[id number] DOB: 01-17-1951 DOA: 10/11/2016 PCP: Florinda Marker, MD  HPI/Recap of past 24 hours:  Report not able to sleep well due to coughing,  no fever, denies chest pain,  She report lower extremity edema Report being constipated  Daughter in room Assessment/Plan: Principal Problem:   CAP (community acquired pneumonia) Active Problems:   Hyperlipidemia associated with type 2 diabetes mellitus ([place])   HYPERTENSION, BENIGN ESSENTIAL   Automatic implantable cardioverter-defibrillator in situ   Type 2 diabetes mellitus with stage 2 chronic kidney disease ([place])   COPD exacerbation (HCC)   Chronic respiratory failure with hypoxia ([place])   Sepsis associated with influenza/pneumonia Acute on chronic hypoxic respiratory failure, (home 02 2liter at night and prn) With significant sinus tachycardia, tachypnea, lactic acidosis , increase oxygen requirement on presentation s/p IV hydration on admission, lactic acid trending down ( peaked at  5.17) D/c ivf on 1/10  as patient has low EF of 20% and is at risk of developing CHF exacerbation Treat underlying cause, lactic acid normalized   Influenza complicated by pneumonia Start Tamiflu,  discontinue Zithromax and Rocephin, change to oral doxycycline Wheezing resolved cotninue nebulize treatments with decreased frequency, d/c steroids Attempt to wean supplemental Oxygen to baseline as patient condition allows 0.18 -o,20Procalcitonin  Remain coughing, get ct chest to further eval   Mild hyperkalemia: k 5.2 Report drinks orange juice, change diet to low k diet Hold losartan kayexlate x1, repeat bmp in am  NICM with EF 20-25%  Start IV diuretic given active IV hydration per sepsis protocol D/c ivf on 1/10,  Lasix held on 1/9, restarted on /10  HTN with Hypertensive urgency on admission bp stable, continue norvasc, hold losartan due to hyperkalemia  Insulin dependent DM type II Will hold oral meds  , continue insulin home doses and add SSI, carb modified diet and monitor CBG's Blood sugar elevated, partially due to steroids, titrate insulin, taper steroids   Morbid obesity: Body mass index is 45.6 kg/m.   DVT prophylaxis: Heparin Code Status: full Family Communication: patient and daughter at bedside Disposition Plan: home in 1-2 days, she report previously independent Consults called: none   Procedures:  none  Antibiotics:  doxycycline   Objective: BP 121/87 (BP Location: Right Arm)   Pulse 74   Temp 97.7 F (36.5 C) (Oral)   Resp 18   Ht 5' (1.524 m)   Wt 105.9 kg (233 lb 8 oz)   SpO2 97%   BMI 45.60 kg/m   Intake/Output Summary (Last 24 hours) at 10/13/16 1129 Last data filed at 10/13/16 0931  Gross per 24 hour  Intake              483 ml  Output             1300 ml  Net             -817 ml   Filed Weights   10/11/16 1013 10/11/16 2039 10/13/16 0517  Weight: 103.4 kg (228 lb) 105.7 kg (233 lb) 105.9 kg (233 lb 8 oz)    Exam:   General:  NAD, obses  Cardiovascular: RRR  Respiratory: some scattered wheezing has resolved, overall very diminished, few rales at basis  Abdomen: Soft/ND/NT, positive BS  Musculoskeletal: trace pitting Edema bilateral lower extremities  Neuro: aaox3  Data Reviewed: Basic Metabolic Panel:  Recent Labs Lab 10/11/16 1025 10/11/16 1525 10/12/16 0620 10/13/16 0528  NA 140 136 136 138  K 3.7  3.9 5.0 5.2*  CL 103 99* 102 101  CO2 22 25 25 28   GLUCOSE 247* 460* 320* 206*  BUN 12 14 20  35*  CREATININE 0.84 1.02* 0.87 1.06*  CALCIUM 9.0 8.4* 8.3* 8.7*  MG  --   --   --  2.3   Liver Function Tests:  Recent Labs Lab 10/11/16 1525  AST 29  ALT 20  ALKPHOS 117  BILITOT 0.5  PROT 6.8  ALBUMIN 3.6   No results for input(s): LIPASE, AMYLASE in the last 168 hours. No results for input(s): AMMONIA in the last 168 hours. CBC:  Recent Labs Lab 10/11/16 1025 10/11/16 1525 10/12/16 0620 10/13/16 0528    WBC 8.6 6.4 10.7* 19.5*  NEUTROABS 6.5 6.2  --   --   HGB 15.2* 13.9 14.0 14.1  HCT 45.6 44.1 44.0 44.7  MCV 86.2 87.7 87.3 87.3  PLT 144* 148* 144* 175   Cardiac Enzymes:   No results for input(s): CKTOTAL, CKMB, CKMBINDEX, TROPONINI in the last 168 hours. BNP (last 3 results)  Recent Labs  01/03/16 0506 02/11/16 0005 10/11/16 1025  BNP 35.5 25.0 204.8*    ProBNP (last 3 results) No results for input(s): PROBNP in the last 8760 hours.  CBG:  Recent Labs Lab 10/12/16 1733 10/12/16 1744 10/12/16 1840 10/12/16 2036 10/13/16 0744  GLUCAP 513* 446* 541* 398* 207*    Recent Results (from the past 240 hour(s))  Blood culture (routine x 2)     Status: None (Preliminary result)   Collection Time: 10/11/16 10:35 AM  Result Value Ref Range Status   Specimen Description BLOOD RIGHT HAND  Final   Special Requests IN PEDIATRIC BOTTLE 4CC  Final   Culture NO GROWTH 1 DAY  Final   Report Status PENDING  Incomplete  Blood culture (routine x 2)     Status: Abnormal (Preliminary result)   Collection Time: 10/11/16 10:40 AM  Result Value Ref Range Status   Specimen Description BLOOD LEFT HAND  Final   Special Requests IN PEDIATRIC BOTTLE 4CC  Final   Culture  Setup Time   Final    GRAM POSITIVE COCCI IN CLUSTERS IN PEDIATRIC BOTTLE CRITICAL RESULT CALLED TO, READ BACK BY AND VERIFIED WITH: [facility] [phone] BY J FUDESCO    Culture STAPHYLOCOCCUS SPECIES (COAGULASE NEGATIVE) (A)  Final   Report Status PENDING  Incomplete  Blood Culture ID Panel (Reflexed)     Status: Abnormal   Collection Time: 10/11/16 10:40 AM  Result Value Ref Range Status   Enterococcus species NOT DETECTED NOT DETECTED Final   Listeria monocytogenes NOT DETECTED NOT DETECTED Final   Staphylococcus species DETECTED (A) NOT DETECTED Final    Comment: CRITICAL RESULT CALLED TO, READ BACK BY AND VERIFIED WITH: [facility] [phone] J FUDESCO    Staphylococcus aureus NOT DETECTED NOT  DETECTED Final   Methicillin resistance NOT DETECTED NOT DETECTED Final   Streptococcus species NOT DETECTED NOT DETECTED Final   Streptococcus agalactiae NOT DETECTED NOT DETECTED Final   Streptococcus pneumoniae NOT DETECTED NOT DETECTED Final   Streptococcus pyogenes NOT DETECTED NOT DETECTED Final   Acinetobacter baumannii NOT DETECTED NOT DETECTED Final   Enterobacteriaceae species NOT DETECTED NOT DETECTED Final   Enterobacter cloacae complex NOT DETECTED NOT DETECTED Final   Escherichia coli NOT DETECTED NOT DETECTED Final   Klebsiella oxytoca NOT DETECTED NOT DETECTED Final   Klebsiella pneumoniae NOT DETECTED NOT DETECTED Final   Proteus species NOT DETECTED  NOT DETECTED Final   Serratia marcescens NOT DETECTED NOT DETECTED Final   Haemophilus influenzae NOT DETECTED NOT DETECTED Final   Neisseria meningitidis NOT DETECTED NOT DETECTED Final   Pseudomonas aeruginosa NOT DETECTED NOT DETECTED Final   Candida albicans NOT DETECTED NOT DETECTED Final   Candida glabrata NOT DETECTED NOT DETECTED Final   Candida krusei NOT DETECTED NOT DETECTED Final   Candida parapsilosis NOT DETECTED NOT DETECTED Final   Candida tropicalis NOT DETECTED NOT DETECTED Final     Studies: No results found.  Scheduled Meds: . amLODipine  5 mg Oral Daily  . aspirin EC  81 mg Oral Daily  . atorvastatin  80 mg Oral QHS  . doxycycline  100 mg Oral Q12H  . feeding supplement (GLUCERNA SHAKE)  237 mL Oral TID BM  . furosemide  60 mg Intravenous BID  . heparin  5,000 Units Subcutaneous Q8H  . insulin aspart  0-9 Units Subcutaneous TID WC  . insulin aspart  7 Units Subcutaneous BID WC  . insulin glargine  35 Units Subcutaneous QHS  . ipratropium-albuterol  3 mL Nebulization BID  . metoprolol succinate  50 mg Oral Daily  . oseltamivir  75 mg Oral BID  . sodium chloride flush  3 mL Intravenous Q12H  . sodium polystyrene  15 g Oral Once    Continuous Infusions:   Time spent: 47mins  Taquanna Borras MD,  PhD  [facility] Pager [phone]. If 7PM-7AM, please contact night-coverage at [email or web address], password [facility] 10/13/2016, 11:29 AM  LOS: 2 days

## 2016-10-14 DIAGNOSIS — I5022 Chronic systolic (congestive) heart failure: Secondary | ICD-10-CM

## 2016-10-14 LAB — CULTURE, BLOOD (ROUTINE X 2)

## 2016-10-14 LAB — HEMOGLOBIN A1C
Hgb A1c MFr Bld: 8.8 % — ABNORMAL HIGH (ref 4.8–5.6)
Mean Plasma Glucose: 206 mg/dL

## 2016-10-14 LAB — BASIC METABOLIC PANEL
Anion gap: 8 (ref 5–15)
BUN: 33 mg/dL — ABNORMAL HIGH (ref 6–20)
CO2: 34 mmol/L — ABNORMAL HIGH (ref 22–32)
Calcium: 9 mg/dL (ref 8.9–10.3)
Chloride: 99 mmol/L — ABNORMAL LOW (ref 101–111)
Creatinine, Ser: 0.9 mg/dL (ref 0.44–1.00)
GFR calc Af Amer: >60 mL/min (ref >60)
GFR calc non Af Amer: >60 mL/min (ref >60)
Glucose, Bld: 186 mg/dL — ABNORMAL HIGH (ref 65–99)
Potassium: 3.9 mmol/L (ref 3.5–5.1)
Sodium: 141 mmol/L (ref 135–145)

## 2016-10-14 LAB — CBC WITH DIFFERENTIAL/PLATELET
Basophils Absolute: 0 10*3/uL (ref 0.0–0.1)
Basophils Relative: 0 %
Eosinophils Absolute: 0 10*3/uL (ref 0.0–0.7)
Eosinophils Relative: 0 %
HCT: 45.9 % (ref 36.0–46.0)
Hemoglobin: 14.3 g/dL (ref 12.0–15.0)
Lymphocytes Relative: 7 %
Lymphs Abs: 1 10*3/uL (ref 0.7–4.0)
MCH: 26.9 pg (ref 26.0–34.0)
MCHC: 31.2 g/dL (ref 30.0–36.0)
MCV: 86.4 fL (ref 78.0–100.0)
Monocytes Absolute: 0.7 10*3/uL (ref 0.1–1.0)
Monocytes Relative: 5 %
Neutro Abs: 13 10*3/uL — ABNORMAL HIGH (ref 1.7–7.7)
Neutrophils Relative %: 89 %
Platelets: 158 10*3/uL (ref 150–400)
RBC: 5.31 MIL/uL — ABNORMAL HIGH (ref 3.87–5.11)
RDW: 16.4 % — ABNORMAL HIGH (ref 11.5–15.5)
WBC: 14.6 10*3/uL — ABNORMAL HIGH (ref 4.0–10.5)

## 2016-10-14 LAB — GLUCOSE, CAPILLARY
Glucose-Capillary: 143 mg/dL — ABNORMAL HIGH (ref 65–99)
Glucose-Capillary: 142 mg/dL — ABNORMAL HIGH (ref 65–99)

## 2016-10-14 MED ORDER — OSELTAMIVIR PHOSPHATE 75 MG PO CAPS: 75.0000 mg | ORAL_CAPSULE | Freq: Two times a day (BID) | ORAL | 0 refills | Status: AC

## 2016-10-14 MED ORDER — GLUCERNA SHAKE PO LIQD: 237.0000 mL | Freq: Three times a day (TID) | ORAL | 0 refills | Status: DC

## 2016-10-14 MED ORDER — METFORMIN HCL ER 500 MG PO TB24: 500.0000 mg | ORAL_TABLET | Freq: Every day | ORAL | 0 refills | Status: DC

## 2016-10-14 MED ORDER — DOXYCYCLINE HYCLATE 100 MG PO TABS: 100.0000 mg | ORAL_TABLET | Freq: Two times a day (BID) | ORAL | 0 refills | Status: AC

## 2016-10-14 MED ORDER — AMLODIPINE BESYLATE 5 MG PO TABS: 5.0000 mg | ORAL_TABLET | Freq: Every day | ORAL | 0 refills | Status: DC

## 2016-10-14 NOTE — Progress Notes (Signed)
Spoke with the MD. With patient room air saturation,its o.k. for the M.D to discharged patient if she is able to maintained normal room air saturation.

## 2016-10-14 NOTE — Progress Notes (Signed)
Patient has been from the oxygen via nasal canula at 2@lpm   For 20 minutes and she is able to maintain 93-98%  oxygen saturation at room air.Patient said she is just using oxygen as per needed only.

## 2016-10-14 NOTE — Care Management Note (Signed)
Case Management Note  Patient Details  Name: [name] MRN: [id number] Date of Birth: [birth year]  Subjective/Objective:      CM following for progression and d/c planning.               Action/Plan: Per pt RN pt requires a 3:1 commode for home use. Order placed with [facility] for delivery to room . Pt is Oxygen dep at home with O2 from [facility], will need to borrow tank from [facility] for pt to use to get home as her daughter is here and did not bring a tank.  Expected Discharge Date:    10/14/2016              Expected Discharge Plan:  Home/Self Care  In-House Referral:  NA  Discharge planning Services  CM Consult  Post Acute Care Choice:  Durable Medical Equipment Choice offered to:  Patient  DME Arranged:  3-N-1 DME Agency:  [place]:  NA [place] Agency:     Status of Service:  Completed, signed off  If discussed at [place] of Stay Meetings, dates discussed:    Additional Comments:  Adron Bene, RN 10/14/2016, 12:19 PM

## 2016-10-14 NOTE — Discharge Summary (Signed)
Discharge Summary  [name] UEA:[id number] DOB: 06-23-1951  PCP: Florinda Marker, MD  Admit date: 10/11/2016 Discharge date: 10/14/2016  Time spent: <3mins  Recommendations for Outpatient Follow-up:  1. F/u with PMD within a week  for hospital discharge follow up, repeat cbc/bmp at follow up, pmd to continue monitor potassium level, continue adjust blood pressure and diabetes meds.  Discharge Diagnoses:  Active Hospital Problems   Diagnosis Date Noted  . CAP (community acquired pneumonia) 08/08/2014  . COPD exacerbation ([place]) 10/11/2016  . Chronic respiratory failure with hypoxia ([place]) 10/11/2016  . Type 2 diabetes mellitus with stage 2 chronic kidney disease ([place]) 04/03/2012  . HYPERTENSION, BENIGN ESSENTIAL 05/25/2007  . Hyperlipidemia associated with type 2 diabetes mellitus ([place]) 05/25/2007  . Automatic implantable cardioverter-defibrillator in situ 05/04/2007    Resolved Hospital Problems   Diagnosis Date Noted Date Resolved  No resolved problems to display.    Discharge Condition: stable  Diet recommendation: heart healthy/carb modified  Filed Weights   10/11/16 2039 10/13/16 0517 10/13/16 2043  Weight: 105.7 kg (233 lb) 105.9 kg (233 lb 8 oz) 105.9 kg (233 lb 7.5 oz)    History of present illness:  PCP: Florinda Marker, MD Patient coming from:  Chief Complaint: increasing SOB, cough, myalgia  HPI: [name] is a 66 y.o. female with medical history significant of NICM with EF 20-25%, s/p AICD, COPD with chronic respiratory failure on O2 use around the [place] at home , PAF - not on anticoagulation therapy, DM type II, HTN, Hyperlipidemia who presented to the Ed with c/o subjective fevers, chills, worsening of SOB and myalgia with. She attended a funeral on Sunday (10/10/15) and few hours later started having muscle ache and runny nose with sneezing. She reported having subjective fevers, cough with white phlegm, loss of appetite with nausea, occasional vomiting  and progressive SOB.  Usually she uses oxygen at 2L [place], but started increasing the flow of oxygen without relief.  Patient was unable to rest last night due to orthopnea and increasing work of breathing with wheezing, eventually today EMS was summoned to her residence. She was was found to be hypoxic with SpO2 88% on 2 L oxygen given few Nebulizer treatments, Magnesium and steroid - 125 mg of Solumedrol IV  ED Course: In the ED patient was febrile with temp of 103.4tachycardic with HR 122-135, tachypneic with RR 20-36BP was elevated to 204/120 mm Hg, her blood work showed elevated Lactic Acid - 3.65, but CBC showed normal WBC count, chest XRay revealed multifocal infiltrates (right upper lobe and right infrahilar region) Oxygen demand increased to 4-5 L O2 to maintain saturation above 90% Patient was placed on Rocephin and Zithromax in the ED.   Hospital Course:  Principal Problem:   CAP (community acquired pneumonia) Active Problems:   Hyperlipidemia associated with type 2 diabetes mellitus ([place])   HYPERTENSION, BENIGN ESSENTIAL   Automatic implantable cardioverter-defibrillator in situ   Type 2 diabetes mellitus with stage 2 chronic kidney disease (HCC)   COPD exacerbation (HCC)   Chronic respiratory failure with hypoxia (HCC)   Sepsis associated with influenza/pneumonia Acute on chronic hypoxic respiratory failure, (home 02 2liter at night and prn) With significant sinus tachycardia, tachypnea, lactic acidosis , increase oxygen requirement on presentation s/p IV hydration on admission, lactic acid trending down ( peaked at  5.17) D/c ivf on 1/10  as patient has low EF of 20% and is at risk of developing CHF exacerbation Treat underlying cause, lactic acid  normalized  Influenza complicated by pneumonia Start Tamiflu, discontinue Zithromax and Rocephin, change to oral doxycycline 0.18 -o,20Procalcitonin  Remain coughing on 1/11  ct chest on 1/11: "1. Stable appearance of mosaic  attenuation pattern within the right greater than left lungs ; this is a nonspecific finding and can be seen in small airways disease, edema, infection, or chronic interstitial lung disease. Small foci of consolidation in the right greater than left lower lobes, suspect infection. No pleural effusion. 2. Cardiomegaly 3. Mild mediastinal adenopathy, possibly reactive."  Wheezing resolved, supplement oxygen  is weanned to baseline. She is discharged home with tamiflu and doxycyclin to finish treatment course, she is to follow up with pmd , pmd to repeat chest ct to ensure resolution of the above CT findings.   Mild hyperkalemia: k 5.2 Report drinks orange juice, change diet to low k diet kayexlate x1, repeat bmp in am, k normalized, Losartan discontinued  NICM with EF 20-25%s/p icd ,followed by Dr Lovena Le Start IV diuretic given active IV hydration per sepsis protocol D/c ivf on 1/10,  Lasix held on 1/9, restarted on /10  HTN with Hypertensive urgency on admission bp stable, continue norvasc, toprol-xl Discontinue  losartan due to hyperkalemia  Insulin dependent DM type II  oral meds held in the hospital, resumed at discharge , Blood sugar elevated, partially due to steroids, titrate insulin, taper steroids Blood sugar improved after stopping steroids. She is discharged home on home dose insulin, continue diet modification a1c 8.8, she is to work with her primary care doctor for blood sugar control  Morbid obesity: Body mass index is 45.6 kg/m.   DVT prophylaxis while in the hospital:Heparin Code Status:full Family Communication:patient and daughter at bedside Disposition Plan:home Consults called:none   Procedures:  none  Antibiotics:  doxycycline    Discharge Exam: BP 136/66 (BP Location: Right Arm)   Pulse 82   Temp 98 F (36.7 C) (Oral)   Resp 18   Ht 5' (1.524 m)   Wt 105.9 kg (233 lb 7.5 oz)   SpO2 96%   BMI 45.60 kg/m      General:  NAD, obses  Cardiovascular: RRR  Respiratory: some scattered wheezing has resolved, overall very diminished, few rales at basis has improved as well  Abdomen: Soft/ND/NT, positive BS  Musculoskeletal: trace pitting Edema bilateral lower extremities has improved  Neuro: aaox3  Discharge Instructions You were cared for by a hospitalist during your hospital stay. If you have any questions about your discharge medications or the care you received while you were in the hospital after you are discharged, you can call the unit and asked to speak with the hospitalist on call if the hospitalist that took care of you is not available. Once you are discharged, your primary care physician will handle any further medical issues. Please note that NO REFILLS for any discharge medications will be authorized once you are discharged, as it is imperative that you return to your primary care physician (or establish a relationship with a primary care physician if you do not have one) for your aftercare needs so that they can reassess your need for medications and monitor your lab values.  Discharge Instructions    Diet - low sodium heart healthy    Complete by:  As directed    Carb modified diet   Increase activity slowly    Complete by:  As directed      Allergies as of 10/14/2016      Reactions   Actos [  pioglitazone] Other (See Comments)   REACTION: congestive heart failure   Naproxen Other (See Comments)   500mg  dose made her sleep for two days   Rosiglitazone Hives   Hydrocortisone Other (See Comments)   unknown      Medication List    STOP taking these medications   amLODipine-olmesartan 10-40 MG tablet Commonly known as:  AZOR     TAKE these medications   albuterol (2.5 MG/3ML) 0.083% nebulizer solution Commonly known as:  PROVENTIL USE 1 VIAL VIA NEBULIZER EVERY 6 HOURS AS NEEDED FOR WHEEZING OR SHORTNESS OF BREATH   amLODipine 5 MG tablet Commonly known as:   NORVASC Take 1 tablet (5 mg total) by mouth daily. Start taking on:  10/15/2016   aspirin EC 81 MG tablet Take 1 tablet (81 mg total) by mouth daily.   atorvastatin 80 MG tablet Commonly known as:  LIPITOR Take 1 tablet (80 mg total) by mouth at bedtime.   doxycycline 100 MG tablet Commonly known as:  VIBRA-TABS Take 1 tablet (100 mg total) by mouth every 12 (twelve) hours.   feeding supplement (GLUCERNA SHAKE) Liqd Take 237 mLs by mouth 3 (three) times daily between meals.   Fluticasone-Salmeterol 250-50 MCG/DOSE Aepb Commonly known as:  ADVAIR DISKUS Inhale 1 puff into the lungs 2 (two) times daily.   furosemide 20 MG tablet Commonly known as:  LASIX Take 3 tablets (60 mg total) by mouth 2 (two) times daily.   glipiZIDE 5 MG tablet Commonly known as:  GLUCOTROL Take 1 tablet (5 mg total) by mouth 2 (two) times daily before a meal.   ibuprofen 200 MG tablet Commonly known as:  ADVIL,MOTRIN Take 400 mg by mouth every 6 (six) hours as needed for moderate pain.   insulin lispro 100 UNIT/ML KiwkPen Commonly known as:  HUMALOG KWIKPEN Take 7 units with breakfast, 7 units with lunch, and 5 units with dinner.   LANTUS 100 UNIT/ML injection Generic drug:  insulin glargine Inject 35 Units into the skin at bedtime.   metFORMIN 500 MG 24 hr tablet Commonly known as:  GLUCOPHAGE-XR Take 1 tablet (500 mg total) by mouth daily with breakfast. What changed:  how much to take   metoprolol succinate 50 MG 24 hr tablet Commonly known as:  TOPROL-XL Take 1 tablet (50 mg total) by mouth daily.   nitroGLYCERIN 0.4 MG SL tablet Commonly known as:  NITROSTAT Place 0.4 mg under the tongue every 5 (five) minutes as needed for chest pain. Reported on 03/18/2016   oseltamivir 75 MG capsule Commonly known as:  TAMIFLU Take 1 capsule (75 mg total) by mouth 2 (two) times daily.   tiotropium 18 MCG inhalation capsule Commonly known as:  SPIRIVA Place 1 capsule (18 mcg total) into inhaler  and inhale daily.            Durable Medical Equipment        Start     Ordered   10/14/16 1123  For home use only DME 3 n 1  Once     10/14/16 1122     Allergies  Allergen Reactions  . Actos [Pioglitazone] Other (See Comments)    REACTION: congestive heart failure   . Naproxen Other (See Comments)    500mg  dose made her sleep for two days  . Rosiglitazone Hives  . Hydrocortisone Other (See Comments)    unknown   Follow-up Information    Burns, Alexa R, MD Follow up in 1 week(s).   Specialty:  Internal  Medicine Why:  hospital discharge follow up, repeat cbc/bmp at follow up. pmd to continue adjust blood pressure meds and diabetes meds, pmd to monitor potassium level Contact information: [place] [address] [phone]            The results of significant diagnostics from this hospitalization (including imaging, microbiology, ancillary and laboratory) are listed below for reference.    Significant Diagnostic Studies: Ct Chest Wo Contrast  Result Date: 10/13/2016 CLINICAL DATA:  Chronic cough for 4 days EXAM: CT CHEST WITHOUT CONTRAST TECHNIQUE: Multidetector CT imaging of the chest was performed following the standard protocol without IV contrast. COMPARISON:  Radiograph 10/11/2016, CT chest 10/04/2009 FINDINGS: Cardiovascular: Limited evaluation due to the absence of intravenous contrast. Atherosclerotic calcifications. No aneurysmal dilatation. Partially visualized pacer leads. Mild cardiomegaly. No gross effusion Mediastinum/Nodes: Mild mediastinal adenopathy is present. A lymph node adjacent to the aortic arch measures 12 mm in transverse diameter. Additional multiple subcentimeter lymph nodes in the prevascular space an adjacent to the vena cava. Subcentimeter hypodense nodule right lobe of thyroid. 1.5 cm hypodense nodule left lobe of thyroid. Trachea is midline. The esophagus is grossly unremarkable. Lungs/Pleura: No pleural effusion. Stable  scattered small nodules in the left lower lobe. Small foci of consolidation within the bilateral right greater than left lower lobes. Mosaic attenuation, right greater than left, noted on prior exams. Upper Abdomen: No acute abnormality in the upper abdomen. Musculoskeletal: No acute or suspicious bone lesions. IMPRESSION: 1. Stable appearance of mosaic attenuation pattern within the right greater than left lungs ; this is a nonspecific finding and can be seen in small airways disease, edema, infection, or chronic interstitial lung disease. Small foci of consolidation in the right greater than left lower lobes, suspect infection. No pleural effusion. 2. Cardiomegaly 3. Mild mediastinal adenopathy, possibly reactive. Electronically Signed   By: Donavan Foil M.D.   On: 10/13/2016 14:18   Dg Chest Port 1 View  Result Date: 10/11/2016 CLINICAL DATA:  Fever, chills, increasing shortness of breath associated with productive cough, muscle aches, sneezing and runny nose. Possible fevers. Decreased appetite with nausea and occasional vomiting. History of COPD on home oxygen, diabetes, cardiac dysrhythmia as with defibrillator placement. Current smoker. EXAM: PORTABLE CHEST 1 VIEW COMPARISON:  Portable chest x-ray of October 11, 2016 at 10:41 a.m. FINDINGS: The lungs are adequately inflated. The pulmonary interstitial markings seen on the earlier study today in the right upper and lower lobe remain increased. There is no pleural effusion. The cardiac silhouette is enlarged. The central pulmonary vascularity is prominent but there is no definite cephalization of the vascular pattern. The ICD is in stable position. IMPRESSION: Slight interval worsening of the appearance of the pulmonary interstitium on the right consistent with pneumonia. Stable cardiomegaly without definite pulmonary edema. Electronically Signed   By: David  Martinique M.D.   On: 10/11/2016 17:28   Dg Chest Portable 1 View  Result Date: 10/11/2016 CLINICAL  DATA:  Shortness of breath, cough, fever EXAM: PORTABLE CHEST 1 VIEW COMPARISON:  02/11/2016 FINDINGS: Cardiomegaly is noted. Single lead cardiac pacemaker is unchanged in position. There is streaky infiltrate in right upper lobe and right infrahilar region. Findings suspicious for multifocal pneumonia. Follow-up to resolution is recommended. IMPRESSION: Cardiomegaly. Stable cardiac pacemaker position.There is streaky infiltrate in right upper lobe and right infrahilar region. Findings suspicious for multifocal pneumonia. Follow-up to resolution is recommended. Electronically Signed   By: Lahoma Crocker M.D.   On: 10/11/2016 11:06    Microbiology:  Recent Results (from the past 240 hour(s))  Blood culture (routine x 2)     Status: None (Preliminary result)   Collection Time: 10/11/16 10:35 AM  Result Value Ref Range Status   Specimen Description BLOOD RIGHT HAND  Final   Special Requests IN PEDIATRIC BOTTLE 4CC  Final   Culture NO GROWTH 2 DAYS  Final   Report Status PENDING  Incomplete  Blood culture (routine x 2)     Status: Abnormal   Collection Time: 10/11/16 10:40 AM  Result Value Ref Range Status   Specimen Description BLOOD LEFT HAND  Final   Special Requests IN PEDIATRIC BOTTLE 4CC  Final   Culture  Setup Time   Final    GRAM POSITIVE COCCI IN CLUSTERS IN PEDIATRIC BOTTLE CRITICAL RESULT CALLED TO, READ BACK BY AND VERIFIED WITH: [facility] [phone] BY J FUDESCO    Culture (A)  Final    STAPHYLOCOCCUS SPECIES (COAGULASE NEGATIVE) THE SIGNIFICANCE OF ISOLATING THIS ORGANISM FROM A SINGLE SET OF BLOOD CULTURES WHEN MULTIPLE SETS ARE DRAWN IS UNCERTAIN. PLEASE NOTIFY THE MICROBIOLOGY DEPARTMENT WITHIN ONE WEEK IF SPECIATION AND SENSITIVITIES ARE REQUIRED.    Report Status 10/14/2016 FINAL  Final  Blood Culture ID Panel (Reflexed)     Status: Abnormal   Collection Time: 10/11/16 10:40 AM  Result Value Ref Range Status   Enterococcus species NOT DETECTED NOT DETECTED Final    Listeria monocytogenes NOT DETECTED NOT DETECTED Final   Staphylococcus species DETECTED (A) NOT DETECTED Final    Comment: CRITICAL RESULT CALLED TO, READ BACK BY AND VERIFIED WITH: [facility] [phone] J FUDESCO    Staphylococcus aureus NOT DETECTED NOT DETECTED Final   Methicillin resistance NOT DETECTED NOT DETECTED Final   Streptococcus species NOT DETECTED NOT DETECTED Final   Streptococcus agalactiae NOT DETECTED NOT DETECTED Final   Streptococcus pneumoniae NOT DETECTED NOT DETECTED Final   Streptococcus pyogenes NOT DETECTED NOT DETECTED Final   Acinetobacter baumannii NOT DETECTED NOT DETECTED Final   Enterobacteriaceae species NOT DETECTED NOT DETECTED Final   Enterobacter cloacae complex NOT DETECTED NOT DETECTED Final   Escherichia coli NOT DETECTED NOT DETECTED Final   Klebsiella oxytoca NOT DETECTED NOT DETECTED Final   Klebsiella pneumoniae NOT DETECTED NOT DETECTED Final   Proteus species NOT DETECTED NOT DETECTED Final   Serratia marcescens NOT DETECTED NOT DETECTED Final   Haemophilus influenzae NOT DETECTED NOT DETECTED Final   Neisseria meningitidis NOT DETECTED NOT DETECTED Final   Pseudomonas aeruginosa NOT DETECTED NOT DETECTED Final   Candida albicans NOT DETECTED NOT DETECTED Final   Candida glabrata NOT DETECTED NOT DETECTED Final   Candida krusei NOT DETECTED NOT DETECTED Final   Candida parapsilosis NOT DETECTED NOT DETECTED Final   Candida tropicalis NOT DETECTED NOT DETECTED Final  Culture, blood (routine x 2)     Status: None (Preliminary result)   Collection Time: 10/12/16 11:33 AM  Result Value Ref Range Status   Specimen Description BLOOD LEFT HAND  Final   Special Requests IN PEDIATRIC BOTTLE 2CC  Final   Culture NO GROWTH 1 DAY  Final   Report Status PENDING  Incomplete  Culture, blood (routine x 2)     Status: None (Preliminary result)   Collection Time: 10/12/16 11:42 AM  Result Value Ref Range Status   Specimen Description BLOOD  RIGHT HAND  Final   Special Requests IN PEDIATRIC BOTTLE 3CC  Final   Culture NO GROWTH 1 DAY  Final  Report Status PENDING  Incomplete     Labs: Basic Metabolic Panel:  Recent Labs Lab 10/11/16 1025 10/11/16 1525 10/12/16 0620 10/13/16 0528 10/14/16 0659  NA 140 136 136 138 141  K 3.7 3.9 5.0 5.2* 3.9  CL 103 99* 102 101 99*  CO2 22 25 25 28  34*  GLUCOSE 247* 460* 320* 206* 186*  BUN 12 14 20  35* 33*  CREATININE 0.84 1.02* 0.87 1.06* 0.90  CALCIUM 9.0 8.4* 8.3* 8.7* 9.0  MG  --   --   --  2.3  --    Liver Function Tests:  Recent Labs Lab 10/11/16 1525  AST 29  ALT 20  ALKPHOS 117  BILITOT 0.5  PROT 6.8  ALBUMIN 3.6   No results for input(s): LIPASE, AMYLASE in the last 168 hours. No results for input(s): AMMONIA in the last 168 hours. CBC:  Recent Labs Lab 10/11/16 1025 10/11/16 1525 10/12/16 0620 10/13/16 0528 10/14/16 0659  WBC 8.6 6.4 10.7* 19.5* 14.6*  NEUTROABS 6.5 6.2  --   --  13.0*  HGB 15.2* 13.9 14.0 14.1 14.3  HCT 45.6 44.1 44.0 44.7 45.9  MCV 86.2 87.7 87.3 87.3 86.4  PLT 144* 148* 144* 175 158   Cardiac Enzymes: No results for input(s): CKTOTAL, CKMB, CKMBINDEX, TROPONINI in the last 168 hours. BNP: BNP (last 3 results)  Recent Labs  01/03/16 0506 02/11/16 0005 10/11/16 1025  BNP 35.5 25.0 204.8*    ProBNP (last 3 results) No results for input(s): PROBNP in the last 8760 hours.  CBG:  Recent Labs Lab 10/13/16 1145 10/13/16 1630 10/13/16 2059 10/14/16 0810 10/14/16 1212  GLUCAP 332* 364* 380* 143* 142*       Signed:  Ermias Tomeo MD, PhD  [facility] 10/14/2016, 1:43 PM

## 2016-10-14 NOTE — Progress Notes (Signed)
Inpatient Diabetes Program Recommendations  AACE/ADA: New Consensus Statement on Inpatient Glycemic Control (2015)  Target Ranges:  Prepandial:   less than 140 mg/dL      Peak postprandial:   less than 180 mg/dL (1-2 hours)      Critically ill patients:  140 - 180 mg/dL   Lab Results  Component Value Date   GLUCAP 143 (H) 10/14/2016   HGBA1C 8.8 (H) 10/13/2016    Review of Glycemic Control Results for [name], [name] (MRN [id number]) as of 10/14/2016 09:47  Ref. Range 10/13/2016 07:44 10/13/2016 11:45 10/13/2016 16:30 10/13/2016 20:59 10/14/2016 08:10  Glucose-Capillary Latest Ref Range: 65 - 99 mg/dL 207 (H) 332 (H) 364 (H) 380 (H) 143 (H)   Inpatient Diabetes Program Recommendations:  Noted patient did not receive meal coverage yesterday ac breakfast and dinner due to not eating 50% but took supplement. Patient will need meal coverage if takes in supplement for glycemic control. Will review again with nurse.   Thank you, Nani Gasser. Macon Sandiford, RN, MSN, CDE Inpatient Glycemic Control Team Team Pager [phone] (8am-5pm) 10/14/2016 9:49 AM

## 2016-10-16 LAB — CULTURE, BLOOD (ROUTINE X 2): Culture: NO GROWTH

## 2016-10-17 LAB — CULTURE, BLOOD (ROUTINE X 2)
Culture: NO GROWTH
Culture: NO GROWTH

## 2016-10-29 DIAGNOSIS — J449 Chronic obstructive pulmonary disease, unspecified: Secondary | ICD-10-CM | POA: Diagnosis not present

## 2016-11-02 ENCOUNTER — Other Ambulatory Visit: Payer: Self-pay | Admitting: Pharmacist

## 2016-11-02 DIAGNOSIS — J449 Chronic obstructive pulmonary disease, unspecified: Secondary | ICD-10-CM

## 2016-11-02 MED ORDER — TIOTROPIUM BROMIDE MONOHYDRATE 18 MCG IN CAPS: 18.0000 ug | ORAL_CAPSULE | Freq: Every day | RESPIRATORY_TRACT | 2 refills | Status: DC

## 2016-11-02 MED ORDER — MOMETASONE FURO-FORMOTEROL FUM 200-5 MCG/ACT IN AERO: 1.0000 | INHALATION_SPRAY | Freq: Two times a day (BID) | RESPIRATORY_TRACT | 0 refills | Status: DC

## 2016-11-02 MED FILL — DULERA 200 MCG/5 MCG INH: 200-5 | 30 days supply | Qty: 13 | Fill #0

## 2016-11-02 NOTE — Progress Notes (Signed)
Patient states she needs help getting her inhalers. [facility] hospital to home program and free voucher for [facility] (none available for Advair). Pharmacy states she has not picked up inhalers since July 2017. Patient scheduled for inhaler teaching.

## 2016-11-03 ENCOUNTER — Ambulatory Visit: Payer: Medicare Other | Admitting: Pharmacist

## 2016-11-07 ENCOUNTER — Ambulatory Visit: Payer: Medicare Other | Admitting: Pharmacist

## 2016-11-07 ENCOUNTER — Telehealth: Payer: Self-pay | Admitting: Internal Medicine

## 2016-11-07 NOTE — Telephone Encounter (Signed)
APT. REMINDER CALL, LMTCB °

## 2016-11-18 ENCOUNTER — Other Ambulatory Visit: Payer: Self-pay | Admitting: Pharmacist

## 2016-11-18 DIAGNOSIS — Z794 Long term (current) use of insulin: Secondary | ICD-10-CM

## 2016-11-18 DIAGNOSIS — E1122 Type 2 diabetes mellitus with diabetic chronic kidney disease: Secondary | ICD-10-CM

## 2016-11-18 DIAGNOSIS — E1143 Type 2 diabetes mellitus with diabetic autonomic (poly)neuropathy: Secondary | ICD-10-CM

## 2016-11-18 DIAGNOSIS — E785 Hyperlipidemia, unspecified: Secondary | ICD-10-CM

## 2016-11-18 DIAGNOSIS — E1169 Type 2 diabetes mellitus with other specified complication: Secondary | ICD-10-CM

## 2016-11-18 DIAGNOSIS — N182 Chronic kidney disease, stage 2 (mild): Secondary | ICD-10-CM

## 2016-11-18 DIAGNOSIS — I10 Essential (primary) hypertension: Secondary | ICD-10-CM

## 2016-11-18 DIAGNOSIS — K3184 Gastroparesis: Secondary | ICD-10-CM

## 2016-11-18 DIAGNOSIS — I5042 Chronic combined systolic (congestive) and diastolic (congestive) heart failure: Secondary | ICD-10-CM

## 2016-11-18 DIAGNOSIS — J449 Chronic obstructive pulmonary disease, unspecified: Secondary | ICD-10-CM

## 2016-11-22 MED ORDER — AMLODIPINE BESYLATE 5 MG PO TABS: 5.0000 mg | ORAL_TABLET | Freq: Every day | ORAL | 3 refills | Status: DC

## 2016-11-22 MED ORDER — FLUTICASONE-SALMETEROL 250-50 MCG/DOSE IN AEPB: 1.0000 | INHALATION_SPRAY | Freq: Two times a day (BID) | RESPIRATORY_TRACT | 5 refills | Status: DC

## 2016-11-22 MED ORDER — ATORVASTATIN CALCIUM 80 MG PO TABS: 80.0000 mg | ORAL_TABLET | Freq: Every day | ORAL | 3 refills | Status: DC

## 2016-11-22 MED ORDER — ALBUTEROL SULFATE (2.5 MG/3ML) 0.083% IN NEBU: INHALATION_SOLUTION | RESPIRATORY_TRACT | 3 refills | Status: DC

## 2016-11-22 MED ORDER — FUROSEMIDE 20 MG PO TABS: 60.0000 mg | ORAL_TABLET | Freq: Two times a day (BID) | ORAL | 11 refills | Status: DC

## 2016-11-22 MED ORDER — TIOTROPIUM BROMIDE MONOHYDRATE 18 MCG IN CAPS: 18.0000 ug | ORAL_CAPSULE | Freq: Every day | RESPIRATORY_TRACT | 11 refills | Status: DC

## 2016-11-22 MED ORDER — INSULIN LISPRO 100 UNIT/ML (KWIKPEN): PEN_INJECTOR | SUBCUTANEOUS | 3 refills | Status: DC

## 2016-11-22 MED ORDER — GLIPIZIDE 5 MG PO TABS: 5.0000 mg | ORAL_TABLET | Freq: Two times a day (BID) | ORAL | 3 refills | Status: DC

## 2016-11-22 MED ORDER — METFORMIN HCL ER 500 MG PO TB24: 500.0000 mg | ORAL_TABLET | Freq: Every day | ORAL | 3 refills | Status: DC

## 2016-11-22 MED ORDER — INSULIN GLARGINE 100 UNITS/ML SOLOSTAR PEN: 35.0000 [IU] | PEN_INJECTOR | Freq: Every day | SUBCUTANEOUS | 11 refills | Status: DC

## 2016-11-22 MED ORDER — METOPROLOL SUCCINATE ER 50 MG PO TB24: 50.0000 mg | ORAL_TABLET | Freq: Every day | ORAL | 3 refills | Status: DC

## 2016-11-22 NOTE — Addendum Note (Signed)
Addended by: Forde Dandy on: 11/22/2016 05:44 PM   Modules accepted: Orders

## 2016-11-25 ENCOUNTER — Telehealth: Payer: Self-pay | Admitting: Internal Medicine

## 2016-11-25 NOTE — Telephone Encounter (Signed)
APT. REMINDER CALL, LMTCB °

## 2016-11-28 ENCOUNTER — Ambulatory Visit: Payer: Medicare Other | Admitting: Pharmacist

## 2016-11-29 DIAGNOSIS — J449 Chronic obstructive pulmonary disease, unspecified: Secondary | ICD-10-CM | POA: Diagnosis not present

## 2016-11-30 ENCOUNTER — Encounter: Payer: Self-pay | Admitting: Internal Medicine

## 2016-11-30 ENCOUNTER — Ambulatory Visit: Payer: Medicare Other | Admitting: Pharmacist

## 2016-11-30 ENCOUNTER — Ambulatory Visit (INDEPENDENT_AMBULATORY_CARE_PROVIDER_SITE_OTHER): Payer: Medicare Other | Admitting: Internal Medicine

## 2016-11-30 DIAGNOSIS — I130 Hypertensive heart and chronic kidney disease with heart failure and stage 1 through stage 4 chronic kidney disease, or unspecified chronic kidney disease: Secondary | ICD-10-CM

## 2016-11-30 DIAGNOSIS — I10 Essential (primary) hypertension: Secondary | ICD-10-CM

## 2016-11-30 DIAGNOSIS — E1122 Type 2 diabetes mellitus with diabetic chronic kidney disease: Secondary | ICD-10-CM | POA: Diagnosis not present

## 2016-11-30 DIAGNOSIS — E669 Obesity, unspecified: Secondary | ICD-10-CM

## 2016-11-30 DIAGNOSIS — J449 Chronic obstructive pulmonary disease, unspecified: Secondary | ICD-10-CM

## 2016-11-30 DIAGNOSIS — I5042 Chronic combined systolic (congestive) and diastolic (congestive) heart failure: Secondary | ICD-10-CM | POA: Diagnosis not present

## 2016-11-30 DIAGNOSIS — F17210 Nicotine dependence, cigarettes, uncomplicated: Secondary | ICD-10-CM

## 2016-11-30 DIAGNOSIS — Z7951 Long term (current) use of inhaled steroids: Secondary | ICD-10-CM

## 2016-11-30 DIAGNOSIS — N182 Chronic kidney disease, stage 2 (mild): Secondary | ICD-10-CM | POA: Diagnosis not present

## 2016-11-30 DIAGNOSIS — Z79899 Other long term (current) drug therapy: Secondary | ICD-10-CM | POA: Diagnosis not present

## 2016-11-30 DIAGNOSIS — Z794 Long term (current) use of insulin: Secondary | ICD-10-CM | POA: Diagnosis not present

## 2016-11-30 DIAGNOSIS — Z9981 Dependence on supplemental oxygen: Secondary | ICD-10-CM

## 2016-11-30 DIAGNOSIS — Z7982 Long term (current) use of aspirin: Secondary | ICD-10-CM | POA: Diagnosis not present

## 2016-11-30 LAB — GLUCOSE, CAPILLARY: Glucose-Capillary: 100 mg/dL — ABNORMAL HIGH (ref 65–99)

## 2016-11-30 MED ORDER — METOPROLOL SUCCINATE ER 100 MG PO TB24: 100.0000 mg | ORAL_TABLET | Freq: Every day | ORAL | 3 refills | Status: DC

## 2016-11-30 NOTE — Progress Notes (Signed)
    CC: Follow-up for COPD  HPI: [name] is a 66 y.o. female with PMHx of COPD, hypertension, chronic congestive heart failure, type 2 diabetes who presents to the clinic for follow-up for COPD.   Patient reports stable shortness of breath and dyspnea with exertion. She denies chest pain or lower extremity edema. She is eating and drinking well. She denies any issues with her medications.  Past Medical History:  Diagnosis Date  . ATN (acute tubular necrosis) ([place]) 07/15/2014  . Automatic implantable cardioverter-defibrillator in situ   . Cardiac defibrillator in situ    Atlas II VR ([facility]) implanted by Dr Lovena Le  . CHF (congestive heart failure) ([place])   . Chronic combined systolic and diastolic heart failure (HCC)    a. EF 35-40% in past;  b. Echo 7/13:  EF 45-50%, Gr 2 diast dysfn, mild AI, mild MAC, trivial MR, mild LAE, PASP 47.  [name] Chronic ulcer of leg ([place])    04-09-15 resolved-not a problem.  . Colon polyps 04/12/2013   Rectosigmoid polyp  . COPD (chronic obstructive pulmonary disease) (HCC)    O2 at night  . Depression   . Diabetes mellitus   . Eczema   . Elevated alkaline phosphatase level    GGT and 5'nucleotidase 8/13 normal  . History of oxygen administration    oxygen @ 2 l/m nasally bedtime 24/7  . HTN (hypertension)   . Hx of cardiac cath    a. [place] 2003 normal;  b. [facility] 6/13:  Mild calcification in the LM, o/w normal coronary arteries, EF 45%.   . Hyperlipidemia   . Hyperthyroidism, subclinical   . Implantable cardioverter-defibrillator (ICD) generator end of life   . Lipoma   . NICM (nonischemic cardiomyopathy) ([place])   . Obesity   . On home oxygen therapy    "2L; 24/7" (10/11/2016)  . Post menopausal syndrome   . Sleep apnea    pt denies 04/12/2013    Review of Systems: Please see pertinent ROS reviewed in HPI and problem based charting.   Physical Exam: Vitals:   11/30/16 1327  BP: (!) 142/66  Pulse: 84  Temp: 98.4 F (36.9 C)  TempSrc: Oral    SpO2: (!) 88%  Weight: 233 lb 9.6 oz (106 kg)  Height: 5' (1.524 m)   General: Vital signs reviewed.  Patient is Obese, in no acute distress and cooperative with exam.  Cardiovascular: RRR, S1 normal, S2 normal, no murmurs, gallops, or rubs. Pulmonary/Chest: Clear to auscultation bilaterally, no wheezes, rales, or rhonchi. Abdominal: Soft, non-tender, non-distended, BS +, obese Extremities: Trace lower extremity edema bilaterally Skin: Warm, dry and intact. No rashes or erythema. Psychiatric: Normal mood and affect. speech and behavior is normal. Cognition and memory are normal.   Assessment & Plan:  See encounters tab for problem based medical decision making. Patient discussed with Dr. Evette Doffing

## 2016-11-30 NOTE — Patient Instructions (Addendum)
Continue taking all medications as prescribed. Wear your oxygen throughout the day, especially with activity and including at night.   Increase metoprolol to 100 mg once a day. You can take 2 of your 50 mg tablets once a day until you run out and get the new prescription.  Follow up in 2 months.

## 2016-12-02 NOTE — Assessment & Plan Note (Signed)
BP Readings from Last 3 Encounters:  11/30/16 (!) 142/66  10/14/16 136/66  09/28/16 (!) 166/90    Lab Results  Component Value Date   NA 141 10/14/2016   K 3.9 10/14/2016   CREATININE 0.90 10/14/2016    Assessment: Blood pressure control:  above goal Progress toward BP goal:   uncontrolled Comments: Patient reports compliance with metoprolol 50 mg daily, amlodipine 10 mg daily, Lasix 60 mg twice a day, olmesartan 40 mg daily.  Plan: Medications:  continue current medications and increase metoprolol 100 mg daily Other plans: Follow up in 3 months

## 2016-12-02 NOTE — Assessment & Plan Note (Addendum)
Patient has a history of COPD gold stage 4 given symptoms and pulmonary function tests. She reports compliance with albuterol as needed, Spiriva daily, Dulera twice a day. Patient reports she did not receive her at home nebulizer machine. Patient is on maximal medical management. She may benefit from pulmonary rehabilitation. Patient's oxygen saturation today coming into the clinic was 88% which qualifies her for continuous home oxygen use. Patient is currently using oxygen at night, but should be wearing it throughout the day. Patient is agreeable.  Assessment: GOLD Stage 4 COPD  Plan: -Continue current medication regimen -Referral to pulmonary rehabilitation -Continuous home oxygen

## 2016-12-02 NOTE — Assessment & Plan Note (Signed)
Lab Results  Component Value Date   HGBA1C 8.8 (H) 10/13/2016   HGBA1C 7.9 08/31/2016   HGBA1C 8.7 06/01/2016     Assessment: Diabetes control:  uncontrolled Progress toward A1C goal:   deteriorated, but now improving Comments: Patient reports compliance with glipizide 5 mg twice a day, NovoLog 7 units in the morning, 7 units at lunch, 5 units with dinner, Lantus 36 units at night, metformin 2000 mg daily. She reports that her glucose measurements at home have been in the 80s to 160s.  Plan: Medications:  continue current medications

## 2016-12-02 NOTE — Assessment & Plan Note (Signed)
Patient denies weight gain, orthopnea, lower extremity edema, worsening dyspnea on exertion. She reports compliance with aspirin, atorvastatin 80 mg daily, Lasix 60 mg twice a day, metoprolol 50 mg daily, amlodipine 10 mg daily and olmesartan 40 mg daily. She is euvolemic on exam.  Assessment: Chronic combined systolic and diastolic congestive heart failure  Plan: -Continue current medications

## 2016-12-05 NOTE — Progress Notes (Signed)
Internal Medicine Clinic Attending  Case discussed with Dr. Burns soon after the resident saw the patient.  We reviewed the resident's history and exam and pertinent patient test results.  I agree with the assessment, diagnosis, and plan of care documented in the resident's note. 

## 2016-12-14 ENCOUNTER — Other Ambulatory Visit: Payer: Self-pay | Admitting: Internal Medicine

## 2016-12-15 ENCOUNTER — Other Ambulatory Visit (HOSPITAL_COMMUNITY): Payer: Self-pay

## 2016-12-15 DIAGNOSIS — I5022 Chronic systolic (congestive) heart failure: Secondary | ICD-10-CM

## 2016-12-19 ENCOUNTER — Telehealth (HOSPITAL_COMMUNITY): Payer: Self-pay | Admitting: *Deleted

## 2016-12-27 DIAGNOSIS — J449 Chronic obstructive pulmonary disease, unspecified: Secondary | ICD-10-CM | POA: Diagnosis not present

## 2016-12-28 ENCOUNTER — Ambulatory Visit (INDEPENDENT_AMBULATORY_CARE_PROVIDER_SITE_OTHER): Payer: Medicare Other | Admitting: *Deleted

## 2016-12-28 DIAGNOSIS — I428 Other cardiomyopathies: Secondary | ICD-10-CM | POA: Diagnosis not present

## 2016-12-28 LAB — CUP PACEART INCLINIC DEVICE CHECK
Brady Statistic RV Percent Paced: 0 %
Date Time Interrogation Session: 20180328113355
HighPow Impedance: 47.25 Ohm
Implantable Lead Implant Date: 20080801
Implantable Lead Location: 753860
Implantable Lead Model: 7120
Implantable Pulse Generator Implant Date: 20150701
Lead Channel Impedance Value: 437.5 Ohm
Lead Channel Pacing Threshold Amplitude: 0.75 V
Lead Channel Pacing Threshold Amplitude: 0.75 V
Lead Channel Pacing Threshold Pulse Width: 0.5 ms
Lead Channel Pacing Threshold Pulse Width: 0.5 ms
Lead Channel Sensing Intrinsic Amplitude: 11.9 mV
Lead Channel Setting Pacing Amplitude: 2.5 V
Lead Channel Setting Pacing Pulse Width: 0.5 ms
Lead Channel Setting Sensing Sensitivity: 0.5 mV
Pulse Gen Serial Number: 7206538

## 2016-12-28 NOTE — Progress Notes (Signed)
ICD check in clinic. Normal device function. Thresholds and sensing consistent with previous device measurements. Impedance trends stable over time. No evidence of any ventricular arrhythmias. Histogram distribution appropriate for patient and level of activity. No changes made this session. Device programmed at appropriate safety margins. Device programmed to optimize intrinsic conduction. Estimated longevity 6.4 years. Pt declines remote monitoring. ROV w/ DC 6/27. Patient education completed including shock plan. Vibration demonstrated for patient.

## 2017-01-16 ENCOUNTER — Telehealth: Payer: Self-pay | Admitting: Pharmacist

## 2017-01-20 ENCOUNTER — Telehealth: Payer: Self-pay | Admitting: Internal Medicine

## 2017-01-20 NOTE — Telephone Encounter (Signed)
TALKED WITH PATIENT, SHE HAS [employer]/MEDICARE, ADVISED PATIENT TO CALL NUMBER ON BACK OF CARD 72 HOURS IN ADVASNCE OF DOCTOR APT AND THEY WILL PICK HER UP AND BACK FOR APT.

## 2017-01-23 ENCOUNTER — Telehealth: Payer: Self-pay | Admitting: Cardiology

## 2017-01-23 NOTE — Progress Notes (Signed)
Spoke to patient for follow up regarding medication management help. Prescriptions were transferred to [facility] pharmacy in February to help with cost. Patient has not yet received in the mail. Will contact the pharmacy and advised patient to call them as well. Patient verbalized understanding. Otherwise, patient reports doing well and requested samples of insulin to cover until her next PCP appointment. Still has supply of inhalers and reports no shortness of breath or symptoms of concern.

## 2017-01-23 NOTE — Telephone Encounter (Signed)
Pt received a letter about needing a new monitor b/c her home monitor has not updated since 2016. Pt is not interested in remote monitoring at this time. Note made in June 2018 appt notes to discuss remote monitoring with pt again at this time.

## 2017-01-27 DIAGNOSIS — J449 Chronic obstructive pulmonary disease, unspecified: Secondary | ICD-10-CM | POA: Diagnosis not present

## 2017-02-08 ENCOUNTER — Encounter (INDEPENDENT_AMBULATORY_CARE_PROVIDER_SITE_OTHER): Payer: Self-pay

## 2017-02-08 ENCOUNTER — Ambulatory Visit (INDEPENDENT_AMBULATORY_CARE_PROVIDER_SITE_OTHER): Payer: Medicare Other | Admitting: Internal Medicine

## 2017-02-08 ENCOUNTER — Encounter: Payer: Self-pay | Admitting: Internal Medicine

## 2017-02-08 VITALS — BP 148/84 | HR 88 | Temp 97.7°F | Ht 60.0 in | Wt 228.1 lb

## 2017-02-08 DIAGNOSIS — R197 Diarrhea, unspecified: Secondary | ICD-10-CM | POA: Diagnosis not present

## 2017-02-08 DIAGNOSIS — N182 Chronic kidney disease, stage 2 (mild): Secondary | ICD-10-CM | POA: Diagnosis not present

## 2017-02-08 DIAGNOSIS — I130 Hypertensive heart and chronic kidney disease with heart failure and stage 1 through stage 4 chronic kidney disease, or unspecified chronic kidney disease: Secondary | ICD-10-CM | POA: Diagnosis not present

## 2017-02-08 DIAGNOSIS — E669 Obesity, unspecified: Secondary | ICD-10-CM

## 2017-02-08 DIAGNOSIS — I5042 Chronic combined systolic (congestive) and diastolic (congestive) heart failure: Secondary | ICD-10-CM | POA: Diagnosis not present

## 2017-02-08 DIAGNOSIS — F17210 Nicotine dependence, cigarettes, uncomplicated: Secondary | ICD-10-CM

## 2017-02-08 DIAGNOSIS — R140 Abdominal distension (gaseous): Secondary | ICD-10-CM

## 2017-02-08 DIAGNOSIS — Z794 Long term (current) use of insulin: Secondary | ICD-10-CM

## 2017-02-08 DIAGNOSIS — R1032 Left lower quadrant pain: Secondary | ICD-10-CM | POA: Diagnosis not present

## 2017-02-08 DIAGNOSIS — E1122 Type 2 diabetes mellitus with diabetic chronic kidney disease: Secondary | ICD-10-CM | POA: Diagnosis not present

## 2017-02-08 DIAGNOSIS — I10 Essential (primary) hypertension: Secondary | ICD-10-CM

## 2017-02-08 DIAGNOSIS — Z7982 Long term (current) use of aspirin: Secondary | ICD-10-CM

## 2017-02-08 DIAGNOSIS — Z7951 Long term (current) use of inhaled steroids: Secondary | ICD-10-CM

## 2017-02-08 DIAGNOSIS — Z79899 Other long term (current) drug therapy: Secondary | ICD-10-CM

## 2017-02-08 DIAGNOSIS — R630 Anorexia: Secondary | ICD-10-CM | POA: Diagnosis not present

## 2017-02-08 DIAGNOSIS — J449 Chronic obstructive pulmonary disease, unspecified: Secondary | ICD-10-CM | POA: Diagnosis not present

## 2017-02-08 LAB — GLUCOSE, CAPILLARY: Glucose-Capillary: 212 mg/dL — ABNORMAL HIGH (ref 65–99)

## 2017-02-08 LAB — POCT GLYCOSYLATED HEMOGLOBIN (HGB A1C): Hemoglobin A1C: 8.4

## 2017-02-08 MED ORDER — PANTOPRAZOLE SODIUM 40 MG PO TBEC: 40.0000 mg | DELAYED_RELEASE_TABLET | Freq: Every day | ORAL | 2 refills | Status: DC

## 2017-02-08 NOTE — Progress Notes (Signed)
CC: Type 2 diabetes  HPI: [name] is a 66 y.o. female with PMHx of type 2 diabetes, COPD Gold stage IV, hypertension, chronic heart failure who presents to the clinic for follow-up for type 2 diabetes.   Patient reports that her breathing is at baseline, she denies any worsening shortness of breath or chest pain.  Patient does admit to a history of acute on chronic abdominal bloating, but in the last 2 months she reports decreased appetite, constant, mild left lower quadrant pain, bloating and loose stools intermittently mixed with normal stools. Her symptoms seem to be worse after eating fried foods. She denies any associated fever, chills, weight loss, night sweats, nausea, vomiting, melena, hematochezia, right upper quadrant pain, vaginal bleeding, dysuria, increased urinary frequency or constipation. Patient does have a history of gastritis diagnosed with EGD in 2016. However, she denies epigastric pain or nausea. She uses Advil once a week. She is not currently on a PPI or H2 blocker. Patient still has her gallbladder and was noted to have sludge in the gallbladder back in 2017, but she denies any right upper quadrant pain, nausea, vomiting, fever. Her pain is also constant and not associated with food intake. Her left lower quadrant pain is concerning for diverticulitis, but patient does not have a history of diverticulosis per recent colonoscopy in 2016. She also does not have fever, chills and her pain has been mild and constant over the last 1-2 months. Patient is status post hysterectomy, but still has her ovaries. The left lower quadrant pain could also be concerning for an ovarian malignancy, but she does not have any weight loss. Given her history of diabetes, heart failure, hypertension, hyperlipidemia, certainly mesenteric ischemia is on the differential. Given her history of hysterectomy, patient could be experiencing intermittent blockages with adhesions causing pain and loose  stools. Patient does not have a history of pancreatitis, but chronic pancreatitis could be causing chronic pain and malabsorption. Other differentials also include celiac disease or bacterial overgrowth.  Please see problem based assessment and plan for more information of patient's chronic medical conditions.   Past Medical History:  Diagnosis Date  . ATN (acute tubular necrosis) ([place]) 07/15/2014  . Automatic implantable cardioverter-defibrillator in situ   . Cardiac defibrillator in situ    Atlas II VR (SJM) implanted by Dr Lovena Le  . CHF (congestive heart failure) ([place])   . Chronic combined systolic and diastolic heart failure (HCC)    a. EF 35-40% in past;  b. Echo 7/13:  EF 45-50%, Gr 2 diast dysfn, mild AI, mild MAC, trivial MR, mild LAE, PASP 47.  [name] Chronic ulcer of leg ([place])    04-09-15 resolved-not a problem.  . Colon polyps 04/12/2013   Rectosigmoid polyp  . COPD (chronic obstructive pulmonary disease) (HCC)    O2 at night  . Depression   . Diabetes mellitus   . Eczema   . Elevated alkaline phosphatase level    GGT and 5'nucleotidase 8/13 normal  . History of oxygen administration    oxygen @ 2 l/m nasally bedtime 24/7  . HTN (hypertension)   . Hx of cardiac cath    a. [place] 2003 normal;  b. LHC 6/13:  Mild calcification in the LM, o/w normal coronary arteries, EF 45%.   . Hyperlipidemia   . Hyperthyroidism, subclinical   . Implantable cardioverter-defibrillator (ICD) generator end of life   . Lipoma   . NICM (nonischemic cardiomyopathy) ([place])   . Obesity   .  On home oxygen therapy    "2L; 24/7" (10/11/2016)  . Post menopausal syndrome   . Sleep apnea    pt denies 04/12/2013    Review of Systems: Please see pertinent ROS reviewed in HPI and problem based charting.   Physical Exam: Blood pressure (!) 148/84, pulse 88, temperature 97.7 F (36.5 C), temperature source Oral, height 5' (1.524 m), weight 228 lb 1.6 oz (103.5 kg), SpO2 92 %. General: Vital signs reviewed.   Patient is in no acute distress and cooperative with exam.  Eyes: conjunctivae normal, no scleral icterus.  Ears, Nose, Throat, and Mouth:  Normal posterior oropharynx. Moist mucous membranes.  Cardiovascular: RRR,  no murmurs, gallops, or rubs. No JVD or carotid bruit present. Trace lower extremity edema bilaterally. Bilateral radial and pedal pulses are intact and symmetric bilaterally.  Pulmonary: Mild expiratory wheezes, no rales, or rhonchi. No accessory muscle use. Gastrointestinal: Soft, mildly tender to palpation in right lower quadrant, left lower quadrant, PERIUmbilical region, non-distended, obese, BS +, no masses, organomegaly, or guarding present. No rebound tenderness. No ascites. Neurologic: Awake, alert, oriented. Moving all extremities equally. Answers all questions appropriately.  Skin: Warm, dry and intact. No rashes or erythema.  Assessment & Plan:  See encounters tab for problem based medical decision making. Patient discussed with Dr. Dareen Piano

## 2017-02-08 NOTE — Patient Instructions (Addendum)
Continue taking all medications as prescribed.  Please follow up in one month for reassessment of your abdominal pain. I will contact you about the next appropriate test we will perform. If your pain worsens, please go to the emergency department.

## 2017-02-08 NOTE — Assessment & Plan Note (Addendum)
Patient does admit to a history of acute on chronic abdominal bloating, but in the last 2 months she reports decreased appetite, constant, mild left lower quadrant pain, bloating and loose stools intermittently mixed with normal stools. Her symptoms seem to be worse after eating fried foods. She denies any associated fever, chills, weight loss, night sweats, nausea, vomiting, melena, hematochezia, right upper quadrant pain, vaginal bleeding, dysuria, increased urinary frequency or constipation. Patient does have a history of gastritis diagnosed with EGD in 2016. However, she denies epigastric pain or nausea. She uses Advil once a week. She is not currently on a PPI or H2 blocker. Patient still has her gallbladder and was noted to have sludge in the gallbladder back in 2017, but she denies any right upper quadrant pain, nausea, vomiting, fever. Her pain is also constant and not associated with food intake. Her left lower quadrant pain is concerning for diverticulitis, but patient does not have a history of diverticulosis per recent colonoscopy in 2016. She also does not have fever, chills and her pain has been mild and constant over the last 1-2 months. Patient is status post hysterectomy, but still has her ovaries. The left lower quadrant pain could also be concerning for an ovarian malignancy, but she does not have any weight loss. Given her history of diabetes, heart failure, hypertension, hyperlipidemia, certainly mesenteric ischemia is on the differential. Given her history of hysterectomy, patient could be experiencing intermittent blockages with adhesions causing pain and loose stools. Patient does not have a history of pancreatitis, but chronic pancreatitis could be causing chronic pain and malabsorption. Other differentials also include celiac disease or bacterial overgrowth. Patient also has a previous suspected history of gastroparesis, but I would doubt that this diagnosis would be causing left lower  quadrant pain.  Assessment: Abdominal pain associated with bloating, decreased appetite, loose stools Differentials in those to rule out: Gastritis, Diverticulitis, obstruction, ovarian malignancy, chronic pancreatitis, mesenteric ischemia, diabetic gastroparesis. Doubt gallbladder disease given location of pain  Plan: -CBC, complete metabolic panel, lipase -CT abdomen and pelvis with contrast, further workup pending results of lab workup and imaging -Protonix 40 mg daily given history of gastritis

## 2017-02-08 NOTE — Assessment & Plan Note (Signed)
Patient has a history of COPD stage IV. She reports that her breathing has been stable and denies any increased dyspnea on exertion, shortness of breath or wheezing. She reports compliance with albuterol, Spiriva, dulera twice a day. She also has a nebulizer machine at home. She has mild expiratory wheezes on examination which is chronic for her and unchanged.  Assessment: Stable COPD  Plan: -Continue current medications

## 2017-02-08 NOTE — Assessment & Plan Note (Signed)
Lab Results  Component Value Date   HGBA1C 8.4 02/08/2017   HGBA1C 8.8 (H) 10/13/2016   HGBA1C 7.9 08/31/2016     Assessment: Diabetes control:  uncontrolled Progress toward A1C goal:   improving Comments: Compliant with glipizide 5 mg twice a day, NovoLog 7 units in the morning, 7 units before lunch, 5 units before dinner, Lantus 35 units at night, metformin 2000 mg daily  Plan: Medications:  continue current medications Other plans: Follow up in 3 months

## 2017-02-08 NOTE — Assessment & Plan Note (Signed)
Patient has a history of chronic combined systolic and diastolic congestive heart failure. She has trace lower extremity edema on examination, but lungs are clear. Overall she appears euvolemic. She reports compliance with aspirin, atorvastatin, Lasix, metoprolol, amlodipine, olmesartan.  Assessment: Chronic stable combined heart failure  Plan: -Continue current medication regimen

## 2017-02-08 NOTE — Assessment & Plan Note (Signed)
BP Readings from Last 3 Encounters:  02/08/17 (!) 148/84  11/30/16 (!) 142/66  10/14/16 136/66    Lab Results  Component Value Date   NA 141 10/14/2016   K 3.9 10/14/2016   CREATININE 0.90 10/14/2016    Assessment: Blood pressure control:  uncontrolled Progress toward BP goal:   above goal Comments: Patient reports compliance with Lasix 60 mg twice a day, amlodipine 5 mg daily, olmesartan 40 mg daily, but she has not increased her metoprolol from 50-100 mg daily.  Plan: Medications:  continue current medications, increase metoprolol to 100 mg daily Other plans: Follow up in 1 month

## 2017-02-09 ENCOUNTER — Telehealth: Payer: Self-pay | Admitting: Internal Medicine

## 2017-02-09 ENCOUNTER — Telehealth: Payer: Self-pay

## 2017-02-09 LAB — CBC WITH DIFFERENTIAL/PLATELET
Basophils Absolute: 0 10*3/uL (ref 0.0–0.2)
Basos: 0 %
EOS (ABSOLUTE): 0.3 10*3/uL (ref 0.0–0.4)
Eos: 3 %
Hematocrit: 44.5 % (ref 34.0–46.6)
Hemoglobin: 14.1 g/dL (ref 11.1–15.9)
Immature Grans (Abs): 0 10*3/uL (ref 0.0–0.1)
Immature Granulocytes: 0 %
Lymphocytes Absolute: 2.6 10*3/uL (ref 0.7–3.1)
Lymphs: 27 %
MCH: 27.6 pg (ref 26.6–33.0)
MCHC: 31.7 g/dL (ref 31.5–35.7)
MCV: 87 fL (ref 79–97)
Monocytes Absolute: 0.7 10*3/uL (ref 0.1–0.9)
Monocytes: 7 %
Neutrophils Absolute: 6 10*3/uL (ref 1.4–7.0)
Neutrophils: 63 %
Platelets: 246 10*3/uL (ref 150–379)
RBC: 5.11 x10E6/uL (ref 3.77–5.28)
RDW: 16.7 % — ABNORMAL HIGH (ref 12.3–15.4)
WBC: 9.6 10*3/uL (ref 3.4–10.8)

## 2017-02-09 LAB — CMP14 + ANION GAP
ALT: 19 IU/L (ref 0–32)
AST: 15 IU/L (ref 0–40)
Albumin/Globulin Ratio: 1.5 (ref 1.2–2.2)
Albumin: 4.1 g/dL (ref 3.6–4.8)
Alkaline Phosphatase: 159 IU/L — ABNORMAL HIGH (ref 39–117)
Anion Gap: 20 mmol/L — ABNORMAL HIGH (ref 10.0–18.0)
BUN/Creatinine Ratio: 17 (ref 12–28)
BUN: 13 mg/dL (ref 8–27)
Bilirubin Total: 0.4 mg/dL (ref 0.0–1.2)
CO2: 27 mmol/L (ref 18–29)
Calcium: 9.5 mg/dL (ref 8.7–10.3)
Chloride: 97 mmol/L (ref 96–106)
Creatinine, Ser: 0.77 mg/dL (ref 0.57–1.00)
GFR calc Af Amer: 95 mL/min/{1.73_m2} (ref >59)
GFR calc non Af Amer: 82 mL/min/{1.73_m2} (ref >59)
Globulin, Total: 2.8 g/dL (ref 1.5–4.5)
Glucose: 222 mg/dL — ABNORMAL HIGH (ref 65–99)
Potassium: 4.1 mmol/L (ref 3.5–5.2)
Sodium: 144 mmol/L (ref 134–144)
Total Protein: 6.9 g/dL (ref 6.0–8.5)

## 2017-02-09 LAB — LIPASE: Lipase: 34 U/L (ref 14–72)

## 2017-02-09 NOTE — Telephone Encounter (Signed)
Requesting lab results. Please call pt back.  

## 2017-02-09 NOTE — Telephone Encounter (Signed)
completed

## 2017-02-09 NOTE — Telephone Encounter (Signed)
I was able to reach [name] to discuss her lab results. We discussed next steps as far as obtaining a CT scan of her abdomen. Please see my clinic note from 02/08/17 for those details.   Martyn Malay, DO PGY-3 Internal Medicine Resident Pager # [phone] 02/09/2017 12:34 PM

## 2017-02-09 NOTE — Progress Notes (Signed)
Internal Medicine Clinic Attending  Case discussed with Dr. Burns soon after the resident saw the patient.  We reviewed the resident's history and exam and pertinent patient test results.  I agree with the assessment, diagnosis, and plan of care documented in the resident's note. 

## 2017-02-10 ENCOUNTER — Telehealth: Payer: Self-pay | Admitting: *Deleted

## 2017-02-10 NOTE — Telephone Encounter (Signed)
-----   Message from Florinda Marker, MD sent at 02/09/2017  8:45 AM EDT ----- Regarding: Patient communication Hi [name],  Could you help me and at your convenience call Ms. Hamor to make her aware that we have ordered a CT scan of her abdomen to evaluate her pain? Her lab work looked okay, nothing concerning. I attempted to call her this morning, but she did not answer.  Thank you very much! [place]

## 2017-02-10 NOTE — Telephone Encounter (Signed)
Pt called / informed of Abd Ct per Dr Quay Burow also informed "Her lab work looked okay, nothing concerning."  Pt stated ok.

## 2017-02-17 ENCOUNTER — Ambulatory Visit (HOSPITAL_COMMUNITY)
Admission: RE | Admit: 2017-02-17 | Discharge: 2017-02-17 | Disposition: A | Discharge status: Home / Self Care | Payer: Medicare Other | Source: Ambulatory Visit | Attending: Internal Medicine | Admitting: Internal Medicine

## 2017-02-17 DIAGNOSIS — R109 Unspecified abdominal pain: Secondary | ICD-10-CM | POA: Diagnosis not present

## 2017-02-17 DIAGNOSIS — R1032 Left lower quadrant pain: Secondary | ICD-10-CM | POA: Insufficient documentation

## 2017-02-17 DIAGNOSIS — R197 Diarrhea, unspecified: Secondary | ICD-10-CM | POA: Diagnosis not present

## 2017-02-17 MED ORDER — IOPAMIDOL (ISOVUE-300) INJECTION 61%
100.0000 mL | Freq: Once | INTRAVENOUS | Status: AC | PRN
  Administered 2017-02-17: 100 mL via INTRAVENOUS

## 2017-02-17 MED ORDER — IOPAMIDOL (ISOVUE-300) INJECTION 61%
INTRAVENOUS | Status: AC
  Filled 2017-02-17: qty 100

## 2017-02-22 ENCOUNTER — Telehealth: Payer: Self-pay | Admitting: Internal Medicine

## 2017-02-22 NOTE — Telephone Encounter (Signed)
I called this patient to discuss the results of her recent CT abdomen/pelvis for evaluation of a 2 month history of vague, cramping abdominal pain associated with bloating, decreased appetite and loose stools. Laboratory work up was unremarkable other than a mildly elevated alk phos. Lipase, AST, ALT, WBC normal. CT scan showed liver is nodular in contour with left hepatic, Gallbladder is unremarkable, Pancreas Unremarkable. No abnormal bowel wall thickening or evidence for bowel obstruction. Peripheral calcified atherosclerotic plaque. Uterus is surgically absent. Urinary bladder is decompressed, limiting evaluation. Cannot exclude urinary bladder wall thickening. Consider correlation with urinalysis to exclude the possibility of infectious process.  Differentials include PUD/Gastritis (on PPI), chronic mesenteric ischemia, diabetic gastroparesis.   There was no answer, but I would like patient to come in for an appointment to discuss results and see how she is responding to PPI. Could consider further work up with gastric emptying study and consideration of mesenteric ischemia if clinical indicated.

## 2017-02-22 NOTE — Telephone Encounter (Signed)
Pt called / informed "CT scan was overall normal and did not explain her abdominal pain" per Dr Quay Burow. Requesting to scheduled an appt w/her or in [facility]  Appt scheduled 5/31 in [facility]

## 2017-02-26 DIAGNOSIS — J449 Chronic obstructive pulmonary disease, unspecified: Secondary | ICD-10-CM | POA: Diagnosis not present

## 2017-03-02 ENCOUNTER — Encounter: Payer: Self-pay | Admitting: Internal Medicine

## 2017-03-02 ENCOUNTER — Ambulatory Visit (INDEPENDENT_AMBULATORY_CARE_PROVIDER_SITE_OTHER): Payer: Medicare Other | Admitting: Internal Medicine

## 2017-03-02 VITALS — BP 138/68 | HR 88 | Temp 98.3°F | Ht 60.0 in | Wt 224.5 lb

## 2017-03-02 DIAGNOSIS — R140 Abdominal distension (gaseous): Secondary | ICD-10-CM | POA: Diagnosis not present

## 2017-03-02 DIAGNOSIS — Z7984 Long term (current) use of oral hypoglycemic drugs: Secondary | ICD-10-CM

## 2017-03-02 DIAGNOSIS — F17210 Nicotine dependence, cigarettes, uncomplicated: Secondary | ICD-10-CM

## 2017-03-02 DIAGNOSIS — N182 Chronic kidney disease, stage 2 (mild): Secondary | ICD-10-CM

## 2017-03-02 DIAGNOSIS — R1032 Left lower quadrant pain: Secondary | ICD-10-CM | POA: Diagnosis not present

## 2017-03-02 DIAGNOSIS — I10 Essential (primary) hypertension: Secondary | ICD-10-CM

## 2017-03-02 DIAGNOSIS — E669 Obesity, unspecified: Secondary | ICD-10-CM | POA: Diagnosis not present

## 2017-03-02 DIAGNOSIS — Z79899 Other long term (current) drug therapy: Secondary | ICD-10-CM

## 2017-03-02 DIAGNOSIS — K3184 Gastroparesis: Secondary | ICD-10-CM

## 2017-03-02 DIAGNOSIS — E1143 Type 2 diabetes mellitus with diabetic autonomic (poly)neuropathy: Secondary | ICD-10-CM

## 2017-03-02 DIAGNOSIS — Z794 Long term (current) use of insulin: Secondary | ICD-10-CM

## 2017-03-02 DIAGNOSIS — G588 Other specified mononeuropathies: Secondary | ICD-10-CM | POA: Diagnosis not present

## 2017-03-02 DIAGNOSIS — E1122 Type 2 diabetes mellitus with diabetic chronic kidney disease: Secondary | ICD-10-CM | POA: Diagnosis not present

## 2017-03-02 MED ORDER — METOPROLOL SUCCINATE ER 50 MG PO TB24: 100.0000 mg | ORAL_TABLET | Freq: Every day | ORAL | 3 refills | Status: DC

## 2017-03-02 MED ORDER — AMLODIPINE BESYLATE 5 MG PO TABS: 5.0000 mg | ORAL_TABLET | Freq: Every day | ORAL | 3 refills | Status: DC

## 2017-03-02 MED ORDER — GLIPIZIDE 5 MG PO TABS: 5.0000 mg | ORAL_TABLET | Freq: Two times a day (BID) | ORAL | 3 refills | Status: DC

## 2017-03-02 NOTE — Patient Instructions (Signed)
For the bloating, let's try a gastric emptying study. We will call you about the details of it once the insurance approves it.    Hold the metformin for now.  See [place] back after the study.

## 2017-03-02 NOTE — Progress Notes (Signed)
   CC: bloating  HPI:  [name] is a 66 y.o. who presents today for bloating. Please see assessment & plan for status of chronic medical problems.  Past Medical History:  Diagnosis Date  . ATN (acute tubular necrosis) ([place]) 07/15/2014  . Automatic implantable cardioverter-defibrillator in situ   . Cardiac defibrillator in situ    Atlas II VR (SJM) implanted by Dr Lovena Le  . CHF (congestive heart failure) ([place])   . Chronic combined systolic and diastolic heart failure (HCC)    a. EF 35-40% in past;  b. Echo 7/13:  EF 45-50%, Gr 2 diast dysfn, mild AI, mild MAC, trivial MR, mild LAE, PASP 47.  [name] Chronic ulcer of leg ([place])    04-09-15 resolved-not a problem.  . Colon polyps 04/12/2013   Rectosigmoid polyp  . COPD (chronic obstructive pulmonary disease) (HCC)    O2 at night  . Depression   . Diabetes mellitus   . Eczema   . Elevated alkaline phosphatase level    GGT and 5'nucleotidase 8/13 normal  . History of oxygen administration    oxygen @ 2 l/m nasally bedtime 24/7  . HTN (hypertension)   . Hx of cardiac cath    a. [place] 2003 normal;  b. LHC 6/13:  Mild calcification in the LM, o/w normal coronary arteries, EF 45%.   . Hyperlipidemia   . Hyperthyroidism, subclinical   . Implantable cardioverter-defibrillator (ICD) generator end of life   . Lipoma   . NICM (nonischemic cardiomyopathy) ([place])   . Obesity   . On home oxygen therapy    "2L; 24/7" (10/11/2016)  . Post menopausal syndrome   . Sleep apnea    pt denies 04/12/2013    Review of Systems:  Please see each problem below for a pertinent review of systems.  Physical Exam:  Vitals:   03/02/17 1412  BP: 138/68  Pulse: 88  Temp: 98.3 F (36.8 C)  TempSrc: Oral  SpO2: 96%  Weight: 224 lb 8 oz (101.8 kg)  Height: 5' (1.524 m)   Physical Exam  Constitutional: She is oriented to person, place, and time. No distress.  HENT:  Head: Normocephalic and atraumatic.  Eyes: Conjunctivae are normal. No scleral  icterus.  Abdominal: Bowel sounds are normal. She exhibits no distension and no mass. There is tenderness (Focal point tenderness of the left lower quadrant about the width of her digit). There is no rebound and no guarding.  Obese abdomen  Neurological: She is alert and oriented to person, place, and time.  Skin: She is not diaphoretic.   Assessment & Plan:   See Encounters Tab for problem based charting.  Patient seen with Dr. Angelia Mould

## 2017-03-03 MED ORDER — METFORMIN HCL ER 500 MG PO TB24: 2000.0000 mg | ORAL_TABLET | Freq: Every day | ORAL | 3 refills | Status: DC

## 2017-03-03 NOTE — Assessment & Plan Note (Signed)
Assessment Over the last 3 months, she has had bloating associated with poor appetite and loose stool. Fried foods seem to worsen these symptoms, and though metformin is on her list, she had actually stopped taking this medication to see if it might improve her symptoms. She denies eating any dairy or yogurt as well and has not been on PPI therapy since her last visit due to the co-pay of pantoprazole.   She saw her PCP several weeks ago with these symptoms who outlined a very thoughtful plan, and I again confirmed the absence of warning symptoms, like bloody diarrhea, fever. Abdominal CT 02/17/17 did not reveal an acute process. Per review of her chart, she has been treated empirically for gastroparesis in the past, and I think we should confirm this diagnosis prior to restarting therapy.   Plan -Order gastric emptying study to assess for gastroparesis

## 2017-03-03 NOTE — Progress Notes (Signed)
Internal Medicine Clinic Attending  I saw and evaluated the patient.  I personally confirmed the key portions of the history and exam documented by Dr. Charlott Rakes and I reviewed pertinent patient test results.  The assessment, diagnosis, and plan were formulated together and I agree with the documentation in the resident's note. I was present for the entire procedure.

## 2017-03-03 NOTE — Assessment & Plan Note (Signed)
Assessment Over the last 3 months, she developed LLQ abdominal pain which seems to wrap around from the back. Sitting up and eating seem to worsen her pain though ibuprofen seems to improve her pain. Her recent CT scan did not degenerative changes of the lumbar spine though would be inconsistent with this distribution. Given her obesity and bloating, I suspect we may be left with abdominal wall pain secondary to nerve entrapment which may be amenable to trigger point injection.  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 All questions were answered.   The focal point of tenderness of the right lower quadrant was cleaned with alcohol swabs. Using a 25 gauge 1 inch needle the skin was injected with 3 mL of 1% Lidocaine without Epi. The needle was withdrawn, the site was cleaned and covered with a band aid.   The patient didtolerate the procedure well and there were nocomplications.

## 2017-03-03 NOTE — Assessment & Plan Note (Signed)
Assessment She would like to refill her anti-hypertensives today. Though she has proteinuria, ACE/ARB was stopped after her hospitalization in January due to mild hyperkalemia and never resumed in clinic.   Plan -Refilled metoprolol succinate 50 mg tablets to bet taken as 100 mg until we can resume her losartan pending improvement in her appetite -Refilled amlodipine 5 mg

## 2017-03-03 NOTE — Assessment & Plan Note (Signed)
Assessment She would like to refill her glipizide today. Amidst her poor appetite, she denies having any sugars <90.   Plan -Refilled glipizide 5 mg twice daily

## 2017-03-08 ENCOUNTER — Encounter: Payer: Self-pay | Admitting: *Deleted

## 2017-03-15 ENCOUNTER — Telehealth: Payer: Self-pay | Admitting: Internal Medicine

## 2017-03-22 ENCOUNTER — Telehealth: Payer: Self-pay | Admitting: Pharmacist

## 2017-03-29 DIAGNOSIS — J449 Chronic obstructive pulmonary disease, unspecified: Secondary | ICD-10-CM | POA: Diagnosis not present

## 2017-03-31 ENCOUNTER — Ambulatory Visit (HOSPITAL_COMMUNITY)
Admission: RE | Admit: 2017-03-31 | Discharge: 2017-03-31 | Disposition: A | Discharge status: Home / Self Care | Payer: Medicare Other | Source: Ambulatory Visit | Attending: Internal Medicine | Admitting: Internal Medicine

## 2017-03-31 DIAGNOSIS — K3184 Gastroparesis: Secondary | ICD-10-CM | POA: Insufficient documentation

## 2017-03-31 DIAGNOSIS — E1143 Type 2 diabetes mellitus with diabetic autonomic (poly)neuropathy: Secondary | ICD-10-CM | POA: Diagnosis not present

## 2017-03-31 DIAGNOSIS — R197 Diarrhea, unspecified: Secondary | ICD-10-CM | POA: Diagnosis not present

## 2017-03-31 DIAGNOSIS — R140 Abdominal distension (gaseous): Secondary | ICD-10-CM | POA: Diagnosis not present

## 2017-03-31 MED ORDER — TECHNETIUM TC 99M SULFUR COLLOID
2.0000 | Freq: Once | INTRAVENOUS | Status: AC | PRN
  Administered 2017-03-31: 2 via INTRAVENOUS

## 2017-04-03 ENCOUNTER — Ambulatory Visit (INDEPENDENT_AMBULATORY_CARE_PROVIDER_SITE_OTHER): Payer: Medicare Other | Admitting: *Deleted

## 2017-04-03 DIAGNOSIS — I428 Other cardiomyopathies: Secondary | ICD-10-CM | POA: Diagnosis not present

## 2017-04-03 LAB — CUP PACEART INCLINIC DEVICE CHECK
Battery Remaining Longevity: 75 mo
Brady Statistic RV Percent Paced: 1 % — CL
Date Time Interrogation Session: 20180702172406
HighPow Impedance: 51 Ohm
Implantable Lead Implant Date: 20080801
Implantable Lead Location: 753860
Implantable Lead Model: 7120
Implantable Pulse Generator Implant Date: 20150701
Lead Channel Impedance Value: 480 Ohm
Lead Channel Impedance Value: 51 Ohm
Lead Channel Setting Pacing Amplitude: 2.5 V
Lead Channel Setting Pacing Pulse Width: 0.5 ms
Lead Channel Setting Sensing Sensitivity: 0.5 mV
Pulse Gen Serial Number: 7206538

## 2017-04-03 NOTE — Progress Notes (Signed)
TAMASHA LAPLANTE is a 66 y.o. female who was called on behalf of [facility] Geriatrics Task Force. Unable to reach patient

## 2017-04-03 NOTE — Progress Notes (Signed)
ICD check in clinic. Normal device function. Threshold and sensing consistent with previous device measurements. Impedance trends stable over time. No evidence of any ventricular arrhythmias. Histogram distribution appropriate for patient and level of activity. No changes made this session. Device programmed at appropriate safety margins. Device programmed to optimize intrinsic conduction. Estimated longevity 6.67yrs. Pt reports that she no longer has a land line and does not wish to continue home monitor, she prefers to come in to the office and understands that it will be on an every three month basis. ROV with DC 10/3

## 2017-04-14 ENCOUNTER — Telehealth: Payer: Self-pay

## 2017-04-14 NOTE — Telephone Encounter (Signed)
Requesting test results. Please call back.

## 2017-04-14 NOTE — Telephone Encounter (Signed)
What test results?

## 2017-04-17 NOTE — Telephone Encounter (Signed)
Attempted to contact patient about scheduling an appointment.  No answer left message asking to please call back.  Forwarding back to triage.

## 2017-04-21 ENCOUNTER — Telehealth: Payer: Self-pay

## 2017-04-21 NOTE — Telephone Encounter (Signed)
[name] is a 66 y.o. female who was contacted for follow up on COPD medication management.   COPD medications:   - albuterol (Proventil) nebulizer 0.083%  - fluticasone-salmeterol (Advair Diskus) 250-50 MCG  - tiotropium (Spiriva Handihaler) 18 MCG  Pt reports nonadherence. Pt is taking all COPD medications as needed, including her daily maintenance devices. Pt was counseled on the difference between her maintenance medications (Advair & Spiriva) which need to be taken each day, Advair BID, and her rescue medication (Proventil) which should be taken as needed.  Pt reports having any SOB when sweeping or cleaning the house. Pt denies side effects from COPD medications. Pt denies any trouble getting medications refilled. Pt denies difficulty affording or picking up medications.   Advised patient to contact clinic if concerns or symptoms arise. Patient verbalized understanding.  Maryan Char, PharmD Candidate

## 2017-04-24 NOTE — Telephone Encounter (Signed)
Patient was contacted with Casey Wells, PharmD candidate. I agree with the assessment and plan of care documented. 

## 2017-04-28 DIAGNOSIS — J449 Chronic obstructive pulmonary disease, unspecified: Secondary | ICD-10-CM | POA: Diagnosis not present

## 2017-05-03 ENCOUNTER — Ambulatory Visit (INDEPENDENT_AMBULATORY_CARE_PROVIDER_SITE_OTHER): Payer: Medicare Other | Admitting: Internal Medicine

## 2017-05-03 ENCOUNTER — Encounter: Payer: Self-pay | Admitting: Internal Medicine

## 2017-05-03 VITALS — BP 134/53 | HR 91 | Temp 97.9°F | Wt 227.9 lb

## 2017-05-03 DIAGNOSIS — X32XXXA Exposure to sunlight, initial encounter: Secondary | ICD-10-CM

## 2017-05-03 DIAGNOSIS — I10 Essential (primary) hypertension: Secondary | ICD-10-CM | POA: Diagnosis not present

## 2017-05-03 DIAGNOSIS — L408 Other psoriasis: Secondary | ICD-10-CM | POA: Insufficient documentation

## 2017-05-03 DIAGNOSIS — F17210 Nicotine dependence, cigarettes, uncomplicated: Secondary | ICD-10-CM

## 2017-05-03 DIAGNOSIS — L568 Other specified acute skin changes due to ultraviolet radiation: Secondary | ICD-10-CM

## 2017-05-03 DIAGNOSIS — L308 Other specified dermatitis: Secondary | ICD-10-CM | POA: Insufficient documentation

## 2017-05-03 MED ORDER — BETAMETHASONE DIPROPIONATE 0.05 % EX CREA: TOPICAL_CREAM | Freq: Two times a day (BID) | CUTANEOUS | 0 refills | Status: DC

## 2017-05-03 NOTE — Assessment & Plan Note (Addendum)
[name] presented today with bilateral swelling and skin changes on her forearms and hands (including the palms). The abrupt transition on the upper arms makes me think this a photosensitivity rash although she does not have a great deal of sun exposure. She was recently started on Amlodipine around the same time this rash started, a known culprit of photosensitivity. I do not believe this is a rheumatologic disease, including SLE or Dermatomyositis, based on her symptoms and history review. The bilateral nature and >2 months duration argues against cellulitis or a upper extremity DVT. The fact that it occasionally weeps made me worried about possible angioedema; however, the distribution makes this less likely. Mycosis fungoides is another possibility that could not be excluded. The lack of pain and lymphadenopathy are also reassuring that this is unlikely to be due to underlying malignancy.   Plan: - Stop Amlodipine  - Start Betamethasone dipropionate cream - Follow-up in two weeks or sooner if becomes painful or have systemic signs of infection.  Follow-up: If this has not improved consider treating for tinea manuum. Upon further research this is likely the culprit. You may also consider a punch biopsy.

## 2017-05-03 NOTE — Progress Notes (Signed)
CC: Rash on the forearms and neck  HPI:  [name] is a 66 y.o. with a PMHx significant for HTN who presented with a progressive bilateral rash on her forearms and neck of 2 months duration. The rash initially started as dry skin on her forearms that progressed to include her hands (palms included), forearms, and neck line. The rash does no extend up her proximal arm. It is occasionally painful but primarily itchy. She has tried antihistamine cream, triple antibiotic ointment, and rubbing alcohol with little to no relief. She also uses lotion which seems to help. She denies any recent illnesses with antibiotic use and her only recent mediation change is the initiation of Amlodipine around the time the rash started. She recently changed her body wash to [facility] sensitive skin but noticed no change in the rash. She lives with her brother who is not experiencing any similar symptoms. She denies arthralgias, history of seizures, history of fluid around her heart or lungs, oral ulcers, facial rash, or muscle weakness. She has yearly mammograms that have been abnormal but work-up illustrated a benign lesion. She denies excessive sun exposure.   Past Medical History:  Diagnosis Date  . ATN (acute tubular necrosis) ([place]) 07/15/2014  . Automatic implantable cardioverter-defibrillator in situ   . Cardiac defibrillator in situ    Atlas II VR (SJM) implanted by Dr Lovena Le  . CHF (congestive heart failure) ([place])   . Chronic combined systolic and diastolic heart failure (HCC)    a. EF 35-40% in past;  b. Echo 7/13:  EF 45-50%, Gr 2 diast dysfn, mild AI, mild MAC, trivial MR, mild LAE, PASP 47.  [name] Chronic ulcer of leg ([place])    04-09-15 resolved-not a problem.  . Colon polyps 04/12/2013   Rectosigmoid polyp  . COPD (chronic obstructive pulmonary disease) (HCC)    O2 at night  . Depression   . Diabetes mellitus   . Eczema   . Elevated alkaline phosphatase level    GGT and 5'nucleotidase 8/13 normal  .  History of oxygen administration    oxygen @ 2 l/m nasally bedtime 24/7  . HTN (hypertension)   . Hx of cardiac cath    a. [place] 2003 normal;  b. LHC 6/13:  Mild calcification in the LM, o/w normal coronary arteries, EF 45%.   . Hyperlipidemia   . Hyperthyroidism, subclinical   . Implantable cardioverter-defibrillator (ICD) generator end of life   . Lipoma   . NICM (nonischemic cardiomyopathy) ([place])   . Obesity   . On home oxygen therapy    "2L; 24/7" (10/11/2016)  . Post menopausal syndrome   . Sleep apnea    pt denies 04/12/2013   Review of Systems:   She denies chest pain, SOA, fever, chills, diarrhea, dysuria, other skin changes.   Physical Exam:  Vitals:   05/03/17 0948  BP: (!) 134/53  Pulse: 91  Temp: 97.9 F (36.6 C)  TempSrc: Oral  SpO2: 97%  Weight: 227 lb 14.4 oz (103.4 kg)   Physical Exam  Constitutional: She is oriented to person, place, and time. She appears well-developed and well-nourished. No distress.  HENT:  Head: Normocephalic and atraumatic.  Eyes: Pupils are equal, round, and reactive to light. Conjunctivae are normal.  Neck:  Slightly hyperpigmented papules that coalesce into large plaques. Slight erythema. Primarily around the collar no extending to the face/upper neck or the chest/back.  Cardiovascular: Normal rate, regular rhythm, normal heart sounds and intact distal pulses.  Pulmonary/Chest: Effort normal and breath sounds normal.  Abdominal: Soft. Bowel sounds are normal. There is no tenderness.  Neurological: She is alert and oriented to person, place, and time.  Skin:  Lichenification of the forearms bilaterally including the dorsal and palmer surface of the hands. Does not extend to the proximal arm. Slight erythema. Is diffusely swollen, but not pitting edema. No active drainage. No crepitus felt. No lymphadenopathy appreciated.    Right Arm  Media Information     Document Information   Photos  Right arm  05/03/2017 10:29  Attached  To:  [place], MD  [facility]   Left Arm  Media Information     Document Information   Photos  Left arm  05/03/2017 10:29  Attached To:  [place], [place], MD  [facility]    Palms  Media Information     Document Information   Photos  Hands  05/03/2017 10:29  Attached To:  Kirby Funk  Source Information   Lorella Nimrod, MD  [facility]   Assessment & Plan:   See Encounters Tab for problem based charting.  Patient seen with Dr. Lynnae January

## 2017-05-03 NOTE — Patient Instructions (Signed)
It was a pleasure to meet you.   - Please STOP taking you Amlodipine   - Please START using the Betamethasone dipropionate. Wash your hand after you use it.   - Please come back in 2 weeks for a follow-up  Betamethasone skin cream, gel, lotion, or ointment What is this medicine? BETAMETHASONE (bay ta METH a sone) is a corticosteroid. It is used on the skin to treat swelling, redness, itching, and allergic reactions. This medicine may be used for other purposes; ask your health care provider or pharmacist if you have questions. COMMON BRAND NAME(S): Alphatrex, Beta Derm, Beta-Val, Betanate, Betatrex, Del-Beta, Diprolene, Diprolene AF, Diprosone, Maxivate, RRB Pak, Valisone What should I tell my health care provider before I take this medicine? They need to know if you have any of these conditions: -acne or rosacea -any type of active infection -diabetes -glaucoma or cataracts -large areas of burned or damaged skin -skin wasting or thinning -an unusual or allergic reaction to betamethasone, corticosteroids, other medicines, foods, dyes, or preservatives -pregnant or trying to get pregnant -breast-feeding How should I use this medicine? This medicine is for external use only. Do not take by mouth. Follow the directions on the prescription label. Wash your hands before and after use. Apply a thin film of medicine to the affected area. Do not cover with a bandage or dressing unless your doctor or health care professional tells you to. Do not use on healthy skin or over large areas of skin. Do not get this medicine in your eyes. If you do, rinse out with plenty of cool tap water. It is important not to use more medicine than prescribed. Do not use your medicine more often than directed. Do not use for more than 14 days. Talk to your pediatrician regarding the use of this medicine in children. Special care may be needed. While this drug may be prescribed for children as young as 19 years of age  for selected conditions, precautions do apply. If applying this medicine to the diaper area of a child, do not cover with tight-fitting diapers or plastic pants. This may increase the amount of medicine that passes through the skin and increase the risk of serious side effects. Elderly patients are more likely to have damaged skin through aging, and this may increase side effects. This medicine should only be used for brief periods and infrequently in older patients. Overdosage: If you think you have taken too much of this medicine contact a poison control center or emergency room at once. NOTE: This medicine is only for you. Do not share this medicine with others. What if I miss a dose? If you miss a dose, use it as soon as you can. If it is almost time for your next dose, use only that dose. Do not use double or extra doses. What may interact with this medicine? Interactions are not expected. Do not use any other skin products without telling your doctor or health care professional. This list may not describe all possible interactions. Give your health care provider a list of all the medicines, herbs, non-prescription drugs, or dietary supplements you use. Also tell them if you smoke, drink alcohol, or use illegal drugs. Some items may interact with your medicine. What should I watch for while using this medicine? Tell your doctor or health care professional if your symptoms do not improve within one week. Tell your doctor or health care professional if you are exposed to anyone with measles or chickenpox, or if  you develop sores or blisters that do not heal properly. What side effects may I notice from receiving this medicine? Side effects that you should report to your doctor or health care professional as soon as possible: -allergic reactions like skin rash, itching or hives, swelling of the face, lips, or tongue -burning or itching of the skin that does not go away -dark red spots on the  skin -infection -lack of healing of skin condition -painful, red, pus filled blisters in hair follicles -thinning of the skin, with easy bruising, sunburn more likely especially on the face Side effects that usually do not require medical attention (report to your doctor or health care professional if they continue or are bothersome): -dry skin -increased redness or scaling of the skin -mild burning, itching, or irritation of the skin This list may not describe all possible side effects. Call your doctor for medical advice about side effects. You may report side effects to FDA at 1-800-FDA-1088. Where should I keep my medicine? Keep out of the reach of children. Store at room temperature between 2 and 30 degrees C (36 and 86 degrees F). Do not freeze. Throw away any unused medicine after the expiration date. NOTE: This sheet is a summary. It may not cover all possible information. If you have questions about this medicine, talk to your doctor, pharmacist, or health care provider.  2018 Elsevier/Gold Standard (2007-12-19 16:18:56)

## 2017-05-04 NOTE — Progress Notes (Signed)
Internal Medicine Clinic Attending  I saw and evaluated the patient.  I personally confirmed the key portions of the history and exam documented by Dr. Tarri Abernethy and I reviewed pertinent patient test results.  The assessment, diagnosis, and plan were formulated together and I agree with the documentation in the resident's note. [name] has sig lichenification of her B arms, erythroderma. She endorses swelling of UE only - her arms and hands appear swollen but then so do her legs and feet and she states that is nl. Hr R foot has some papules which she states is new. She endorses involvement of her chin and neck. Neck has appearance of acanthosis nigricans along with some plaques. Short trial of conservative tx.. If no better -bx, derm referral.

## 2017-05-17 ENCOUNTER — Ambulatory Visit (INDEPENDENT_AMBULATORY_CARE_PROVIDER_SITE_OTHER): Payer: Medicare Other | Admitting: Internal Medicine

## 2017-05-17 ENCOUNTER — Other Ambulatory Visit: Payer: Self-pay | Admitting: Internal Medicine

## 2017-05-17 ENCOUNTER — Encounter: Payer: Self-pay | Admitting: Internal Medicine

## 2017-05-17 VITALS — BP 174/99 | HR 76 | Temp 97.7°F | Wt 224.3 lb

## 2017-05-17 DIAGNOSIS — R21 Rash and other nonspecific skin eruption: Secondary | ICD-10-CM | POA: Diagnosis not present

## 2017-05-17 DIAGNOSIS — F17210 Nicotine dependence, cigarettes, uncomplicated: Secondary | ICD-10-CM | POA: Diagnosis not present

## 2017-05-17 DIAGNOSIS — Z79899 Other long term (current) drug therapy: Secondary | ICD-10-CM

## 2017-05-17 DIAGNOSIS — Z8249 Family history of ischemic heart disease and other diseases of the circulatory system: Secondary | ICD-10-CM

## 2017-05-17 DIAGNOSIS — I10 Essential (primary) hypertension: Secondary | ICD-10-CM

## 2017-05-17 MED ORDER — LISINOPRIL 20 MG PO TABS: 20.0000 mg | ORAL_TABLET | Freq: Every day | ORAL | 1 refills | Status: DC

## 2017-05-17 MED ORDER — DIPHENHYDRAMINE HCL 25 MG PO CAPS: 25.0000 mg | ORAL_CAPSULE | Freq: Four times a day (QID) | ORAL | 1 refills | Status: DC | PRN

## 2017-05-17 MED ORDER — BETAMETHASONE DIPROPIONATE 0.05 % EX CREA: TOPICAL_CREAM | Freq: Two times a day (BID) | CUTANEOUS | 2 refills | Status: DC

## 2017-05-17 NOTE — Patient Instructions (Addendum)
[name] it was nice meeting you today.  For your skin rash:  I have referred you to dermatology.  Continue applying Beclometasone cream twice daily to affected areas.  Take Benadryl as needed for itching. Please keep in mind this medication may make you drowsy so do not drive or operate heavy machinery.    For your high blood pressure:  Start taking lisinopril 20 mg daily  Continue taking metoprolol and Lasix as prescribed   Return for a follow-up in 2 weeks.

## 2017-05-18 NOTE — Progress Notes (Signed)
   CC: Skin rash  HPI:  [name] is a 66 y.o. female with a past medical history of conditions listed below presenting to the clinic for a follow-up of her skin rash. Hypertension was also discussed during this visit. Please see problem based charting for the status of the patient's current and chronic medical conditions.   Past Medical History:  Diagnosis Date  . ATN (acute tubular necrosis) ([place]) 07/15/2014  . Automatic implantable cardioverter-defibrillator in situ   . Cardiac defibrillator in situ    Atlas II VR (SJM) implanted by Dr Lovena Le  . CHF (congestive heart failure) ([place])   . Chronic combined systolic and diastolic heart failure (HCC)    a. EF 35-40% in past;  b. Echo 7/13:  EF 45-50%, Gr 2 diast dysfn, mild AI, mild MAC, trivial MR, mild LAE, PASP 47.  [name] Chronic ulcer of leg ([place])    04-09-15 resolved-not a problem.  . Colon polyps 04/12/2013   Rectosigmoid polyp  . COPD (chronic obstructive pulmonary disease) (HCC)    O2 at night  . Depression   . Diabetes mellitus   . Eczema   . Elevated alkaline phosphatase level    GGT and 5'nucleotidase 8/13 normal  . History of oxygen administration    oxygen @ 2 l/m nasally bedtime 24/7  . HTN (hypertension)   . Hx of cardiac cath    a. [place] 2003 normal;  b. LHC 6/13:  Mild calcification in the LM, o/w normal coronary arteries, EF 45%.   . Hyperlipidemia   . Hyperthyroidism, subclinical   . Implantable cardioverter-defibrillator (ICD) generator end of life   . Lipoma   . NICM (nonischemic cardiomyopathy) ([place])   . Obesity   . On home oxygen therapy    "2L; 24/7" (10/11/2016)  . Post menopausal syndrome   . Sleep apnea    pt denies 04/12/2013   Review of Systems: Pertinent positives mentioned in HPI. Remainder of all ROS negative.   Physical Exam:  Vitals:   05/17/17 0926 05/17/17 0953  BP: (!) 170/97 (!) 174/99  Pulse: 79 76  Temp: 97.7 F (36.5 C)   TempSrc: Oral   SpO2: 96%   Weight: 224 lb 4.8 oz (101.7  kg)    Physical Exam  Constitutional: She is oriented to person, place, and time. She appears well-developed and well-nourished.  Scratching her arms during the entire visit.  Eyes: Right eye exhibits no discharge. Left eye exhibits no discharge.  Cardiovascular: Normal rate, regular rhythm and intact distal pulses.   Pulmonary/Chest: Effort normal and breath sounds normal. No respiratory distress. She has no wheezes. She has no rales.  Abdominal: Soft. Bowel sounds are normal. She exhibits no distension. There is no tenderness.  Musculoskeletal: She exhibits no edema.  Neurological: She is alert and oriented to person, place, and time.  Skin: Skin is warm and dry.  Dry, scaly skin with lichenification noted on her bilateral upper extremities including palms, lower back, abdomen, inner and posterior thigh.    Assessment & Plan:   See Encounters Tab for problem based charting.  Patient discussed with Dr. Dareen Piano

## 2017-05-18 NOTE — Assessment & Plan Note (Signed)
Assessment Uncontrolled hypertension. Initial blood pressure 170/97 and repeat 174/99. She is currently on metoprolol 100 mg daily and Lasix 60 mg twice daily. Reports compliance with medications.  Plan -Start lisinopril 20 mg daily -BMP at next visit -Return to the clinic in 2 weeks for a follow-up

## 2017-05-18 NOTE — Assessment & Plan Note (Signed)
History of present illness Patient is here for a follow-up of her skin rash. Reports having a diffuse rash on her skin that started 2 months ago. Areas are extremely pruritic. During her previous visit, amlodipine was thought to be a possible precipitating factor and was discontinued. Patient has been using beclomethasone cream and believes her rash is getting better, however, noted to be scratching her arms during the entire visit. Stated a similar rash appears every year and goes away in 2 weeks but this time it has persisted. Patient recently noticed the same skin changes on her inner thighs. Denies having any fevers, chills, joint pains, or mouth ulcers. No recent change in soaps or detergents. She uses Dove soap for sensitive skin.  Assessment Unclear etiology of skin changes. Noted to have dry, scaly skin with lichenification on her bilateral upper extremities including palms, lower back, abdomen, inner and posterior thighs.  Plan -Continue beclomethasone cream -Benadryl -Referral to dermatology

## 2017-05-19 NOTE — Progress Notes (Signed)
Internal Medicine Clinic Attending  Case discussed with Dr. Rathoreat the time of the visit. We reviewed the resident's history and exam and pertinent patient test results. I agree with the assessment, diagnosis, and plan of care documented in the resident's note.  

## 2017-05-29 DIAGNOSIS — J449 Chronic obstructive pulmonary disease, unspecified: Secondary | ICD-10-CM | POA: Diagnosis not present

## 2017-05-31 ENCOUNTER — Ambulatory Visit (INDEPENDENT_AMBULATORY_CARE_PROVIDER_SITE_OTHER): Payer: Medicare Other | Admitting: Internal Medicine

## 2017-05-31 ENCOUNTER — Ambulatory Visit: Payer: Medicare Other | Admitting: Pharmacist

## 2017-05-31 VITALS — BP 142/82 | HR 77 | Temp 97.7°F | Wt 226.4 lb

## 2017-05-31 DIAGNOSIS — I10 Essential (primary) hypertension: Secondary | ICD-10-CM | POA: Diagnosis not present

## 2017-05-31 DIAGNOSIS — Z79899 Other long term (current) drug therapy: Secondary | ICD-10-CM | POA: Diagnosis not present

## 2017-05-31 DIAGNOSIS — E1169 Type 2 diabetes mellitus with other specified complication: Secondary | ICD-10-CM

## 2017-05-31 DIAGNOSIS — R21 Rash and other nonspecific skin eruption: Secondary | ICD-10-CM | POA: Diagnosis not present

## 2017-05-31 DIAGNOSIS — F17210 Nicotine dependence, cigarettes, uncomplicated: Secondary | ICD-10-CM | POA: Diagnosis not present

## 2017-05-31 DIAGNOSIS — Z7951 Long term (current) use of inhaled steroids: Secondary | ICD-10-CM | POA: Diagnosis not present

## 2017-05-31 DIAGNOSIS — E1122 Type 2 diabetes mellitus with diabetic chronic kidney disease: Secondary | ICD-10-CM

## 2017-05-31 DIAGNOSIS — K3184 Gastroparesis: Secondary | ICD-10-CM

## 2017-05-31 DIAGNOSIS — Z825 Family history of asthma and other chronic lower respiratory diseases: Secondary | ICD-10-CM

## 2017-05-31 DIAGNOSIS — E1143 Type 2 diabetes mellitus with diabetic autonomic (poly)neuropathy: Secondary | ICD-10-CM

## 2017-05-31 DIAGNOSIS — J449 Chronic obstructive pulmonary disease, unspecified: Secondary | ICD-10-CM

## 2017-05-31 DIAGNOSIS — N182 Chronic kidney disease, stage 2 (mild): Secondary | ICD-10-CM

## 2017-05-31 DIAGNOSIS — Z794 Long term (current) use of insulin: Secondary | ICD-10-CM

## 2017-05-31 DIAGNOSIS — Z8249 Family history of ischemic heart disease and other diseases of the circulatory system: Secondary | ICD-10-CM

## 2017-05-31 DIAGNOSIS — E785 Hyperlipidemia, unspecified: Secondary | ICD-10-CM

## 2017-05-31 MED ORDER — FLUTICASONE-SALMETEROL 250-50 MCG/DOSE IN AEPB: 1.0000 | INHALATION_SPRAY | Freq: Two times a day (BID) | RESPIRATORY_TRACT | 3 refills | Status: DC

## 2017-05-31 MED ORDER — BETAMETHASONE DIPROPIONATE 0.05 % EX CREA: TOPICAL_CREAM | Freq: Two times a day (BID) | CUTANEOUS | 0 refills | Status: DC

## 2017-05-31 MED ORDER — LISINOPRIL 20 MG PO TABS: 20.0000 mg | ORAL_TABLET | Freq: Every day | ORAL | 0 refills | Status: DC

## 2017-05-31 NOTE — Assessment & Plan Note (Signed)
Stable. Patient denies having any shortness of breath or wheezing. Currently on albuterol when necessary, Spiriva, and Advair. She is requesting a refill on Advair.  Plan -Advair has been refilled -Continue Spiriva -Continue albuterol when necessary

## 2017-05-31 NOTE — Assessment & Plan Note (Signed)
Assessment Patient is here for a follow-up of her skin rash. Reports having significant improvement in her skin rash. She is not complaining of pruritus. On exam, noted to have significant improvement in most areas except the palms of her hands. Patient states she has been unable to apply the steroid cream to the palms of her hands as instructed on the packaging.  Plan -Continue beclomethasone cream. Advised her to apply the cream on the palms of her hands before going to bed at night and then wash the hands in the morning. Continue applying the cream on other affected areas twice daily. -Dermatology referral as patient had mentioned during her prior visit that a similar rash appears every year and there is no improvement on the palms of her hands.

## 2017-05-31 NOTE — Patient Instructions (Addendum)
[name] it was nice seeing you today.  For your skin rash:  Our office will make sure you have an appointment with dermatology.  Continue applying Beclometasone cream twice daily to affected areas. For the palm of your hands, apply the cream at night before going to bed and wash it off in the morning.   For your high blood pressure:  Continue taking lisinopril, metoprolol, and Lasix as prescribed

## 2017-05-31 NOTE — Assessment & Plan Note (Addendum)
Assessment Uncontrolled hypertension. Initial blood pressure 154/128 and repeat 142/82. She is currently on lisinopril 20 mg daily, metoprolol 100 mg daily, and Lasix 60 mg twice daily. Reports compliance with medications except she has not taken Lasix today.  Plan -Continue current management -Emphasized the importance of medication compliance -Check BMP to assess renal function and electrolyte status -Consider increasing the dose of lisinopril if her blood pressure continues to be above goal at her next visit.  Addendum 06/01/2017: Creatinine stable at 0.8 and GFR 81. Sodium 146 and potassium 3.5. Bicarbonate 33. -Repeat BMP at next visit. Messaged front desk to schedule patient for a follow-up in 3-4 weeks. -Tried calling the patient but could not reach her over the phone.

## 2017-05-31 NOTE — Progress Notes (Signed)
   CC: Patient is here for a follow-up of hypertension. Her skin rash was also discussed during this visit.  HPI:  [name] is a 66 y.o. female with a past medical history of conditions listed below presenting to the clinic for a follow-up of hypertension. Her skin rash was also discussed. Please see problem based charting for the status of the patient's current and chronic medical conditions.   Past Medical History:  Diagnosis Date  . ATN (acute tubular necrosis) ([place]) 07/15/2014  . Automatic implantable cardioverter-defibrillator in situ   . Cardiac defibrillator in situ    Atlas II VR (SJM) implanted by Dr Lovena Le  . CHF (congestive heart failure) ([place])   . Chronic combined systolic and diastolic heart failure (HCC)    a. EF 35-40% in past;  b. Echo 7/13:  EF 45-50%, Gr 2 diast dysfn, mild AI, mild MAC, trivial MR, mild LAE, PASP 47.  [name] Chronic ulcer of leg ([place])    04-09-15 resolved-not a problem.  . Colon polyps 04/12/2013   Rectosigmoid polyp  . COPD (chronic obstructive pulmonary disease) (HCC)    O2 at night  . Depression   . Diabetes mellitus   . Eczema   . Elevated alkaline phosphatase level    GGT and 5'nucleotidase 8/13 normal  . History of oxygen administration    oxygen @ 2 l/m nasally bedtime 24/7  . HTN (hypertension)   . Hx of cardiac cath    a. [place] 2003 normal;  b. LHC 6/13:  Mild calcification in the LM, o/w normal coronary arteries, EF 45%.   . Hyperlipidemia   . Hyperthyroidism, subclinical   . Implantable cardioverter-defibrillator (ICD) generator end of life   . Lipoma   . NICM (nonischemic cardiomyopathy) ([place])   . Obesity   . On home oxygen therapy    "2L; 24/7" (10/11/2016)  . Post menopausal syndrome   . Sleep apnea    pt denies 04/12/2013   Review of Systems: Pertinent positives mentioned in HPI. Remainder of all ROS negative.   Physical Exam:  Vitals:   05/31/17 0918 05/31/17 0940  BP: (!) 154/128 (!) 142/82  Pulse: 80 77  Temp: 97.7  F (36.5 C)   TempSrc: Oral   SpO2: 94%   Weight: 226 lb 6.4 oz (102.7 kg)    Physical Exam  Constitutional: She is oriented to person, place, and time. She appears well-developed and well-nourished. No distress.  HENT:  Head: Normocephalic and atraumatic.  Eyes: Right eye exhibits no discharge. Left eye exhibits no discharge.  Cardiovascular: Normal rate, regular rhythm and intact distal pulses.   Pulmonary/Chest: Effort normal and breath sounds normal. No respiratory distress. She has no wheezes. She has no rales.  Abdominal: Soft. Bowel sounds are normal. She exhibits no distension. There is no tenderness.  Musculoskeletal: She exhibits no edema.  Neurological: She is alert and oriented to person, place, and time.  Skin: Skin is warm and dry.  Dry, scaly skin with lichenification noted on her bilateral upper extremities including palms, lower back, abdomen, inner and posterior thigh during her prior visit.  At this visit, there is very mild dry/ scaly skin in some areas and lichenification has almost resolved in all these areas except the palms of her hands.  Psychiatric: She has a normal mood and affect. Her behavior is normal.    Assessment & Plan:   See Encounters Tab for problem based charting.  Patient discussed with Dr. Lynnae January

## 2017-06-01 ENCOUNTER — Other Ambulatory Visit: Payer: Self-pay | Admitting: Internal Medicine

## 2017-06-01 DIAGNOSIS — Z1231 Encounter for screening mammogram for malignant neoplasm of breast: Secondary | ICD-10-CM

## 2017-06-01 LAB — BMP8+ANION GAP
Anion Gap: 14 mmol/L (ref 10.0–18.0)
BUN/Creatinine Ratio: 16 (ref 12–28)
BUN: 14 mg/dL (ref 8–27)
CO2: 33 mmol/L — ABNORMAL HIGH (ref 20–29)
Calcium: 8.7 mg/dL (ref 8.7–10.3)
Chloride: 99 mmol/L (ref 96–106)
Creatinine, Ser: 0.88 mg/dL (ref 0.57–1.00)
GFR calc Af Amer: 81 mL/min/{1.73_m2} (ref >59)
GFR calc non Af Amer: 70 mL/min/{1.73_m2} (ref >59)
Glucose: 107 mg/dL — ABNORMAL HIGH (ref 65–99)
Potassium: 3.5 mmol/L (ref 3.5–5.2)
Sodium: 146 mmol/L — ABNORMAL HIGH (ref 134–144)

## 2017-06-02 NOTE — Progress Notes (Signed)
Internal Medicine Clinic Attending  Case discussed with Dr. Rathoreat the time of the visit. We reviewed the resident's history and exam and pertinent patient test results. I agree with the assessment, diagnosis, and plan of care documented in the resident's note.  

## 2017-06-08 NOTE — Progress Notes (Signed)
S: [name] is a 66 y.o. female reports to clinical pharmacist appointment for medication review. Patient did not bring medication bottles.  Allergies  Allergen Reactions  . Actos [Pioglitazone] Other (See Comments)    REACTION: congestive heart failure   . Naproxen Other (See Comments)    510m dose made her sleep for two days  . Rosiglitazone Hives  . Hydrocortisone Other (See Comments)    unknown   Medication Sig  albuterol (PROVENTIL) (2.5 MG/3ML) 0.083% nebulizer solution USE 1 VIAL VIA NEBULIZER EVERY 6 HOURS AS NEEDED FOR WHEEZING OR SHORTNESS OF BREATH  aspirin EC 81 MG tablet Take 1 tablet (81 mg total) by mouth daily.  atorvastatin (LIPITOR) 80 MG tablet Take 1 tablet (80 mg total) by mouth at bedtime.  betamethasone dipropionate (DIPROLENE) 0.05 % cream Apply topically 2 (two) times daily.  diphenhydrAMINE (BENADRYL) 25 mg capsule Take 1 capsule (25 mg total) by mouth every 6 (six) hours as needed.  feeding supplement, GLUCERNA SHAKE, (GLUCERNA SHAKE) LIQD Take 237 mLs by mouth 3 (three) times daily between meals.  Fluticasone-Salmeterol (ADVAIR DISKUS) 250-50 MCG/DOSE AEPB Inhale 1 puff into the lungs 2 (two) times daily.  furosemide (LASIX) 20 MG tablet Take 3 tablets (60 mg total) by mouth 2 (two) times daily.  glipiZIDE (GLUCOTROL) 5 MG tablet Take 1 tablet (5 mg total) by mouth 2 (two) times daily before a meal.  insulin glargine (LANTUS) 100 unit/mL SOPN Inject 0.35 mLs (35 Units total) into the skin daily.  insulin lispro (HUMALOG KWIKPEN) 100 UNIT/ML KiwkPen Take 7 units with breakfast, 7 units with lunch, and 5 units with dinner.  lisinopril (PRINIVIL,ZESTRIL) 20 MG tablet Take 1 tablet (20 mg total) by mouth daily.  metFORMIN (GLUCOPHAGE-XR) 500 MG 24 hr tablet Take 4 tablets (2,000 mg total) by mouth daily with breakfast. Patient not taking: Reported on 04/03/2017  metoprolol succinate (TOPROL-XL) 50 MG 24 hr tablet Take 2 tablets (100 mg total) by mouth daily.   nitroGLYCERIN (NITROSTAT) 0.4 MG SL tablet Place 0.4 mg under the tongue every 5 (five) minutes as needed for chest pain. Reported on 03/18/2016  pantoprazole (PROTONIX) 40 MG tablet Take 1 tablet (40 mg total) by mouth daily.  tiotropium (SPIRIVA) 18 MCG inhalation capsule Place 1 capsule (18 mcg total) into inhaler and inhale daily.   Past Medical History:  Diagnosis Date  . ATN (acute tubular necrosis) ([place] 07/15/2014  . Automatic implantable cardioverter-defibrillator in situ   . Cardiac defibrillator in situ    Atlas II VR (SJM) implanted by Dr TLovena Le . CHF (congestive heart failure) ([place]   . Chronic combined systolic and diastolic heart failure (HCC)    a. EF 35-40% in past;  b. Echo 7/13:  EF 45-50%, Gr 2 diast dysfn, mild AI, mild MAC, trivial MR, mild LAE, PASP 47.  .Marland [place]ronic ulcer of leg ([place]    04-09-15 resolved-not a problem.  . Colon polyps 04/12/2013   Rectosigmoid polyp  . COPD (chronic obstructive pulmonary disease) (HCC)    O2 at night  . Depression   . Diabetes mellitus   . Eczema   . Elevated alkaline phosphatase level    GGT and 5'nucleotidase 8/13 normal  . History of oxygen administration    oxygen @ 2 l/m nasally bedtime 24/7  . HTN (hypertension)   . Hx of cardiac cath    a[place] normal;  b. LHC 6/13:  Mild calcification in the LM, o/w normal coronary arteries, EF 45%.   .[name]  Hyperlipidemia   . Hyperthyroidism, subclinical   . Implantable cardioverter-defibrillator (ICD) generator end of life   . Lipoma   . NICM (nonischemic cardiomyopathy) ([place])   . Obesity   . On home oxygen therapy    "2L; 24/7" (10/11/2016)  . Post menopausal syndrome   . Sleep apnea    pt denies 04/12/2013   Social History   Social History  . Marital status: Widowed    Spouse name: Alroy Dust  . Number of children: 3  . Years of education: 53   Occupational History  . disabled    Social History Main Topics  . Smoking status: Current Every Day Smoker    Packs/day: 0.30     Years: 39.00    Types: Cigarettes  . Smokeless tobacco: Never Used     Comment: 1 cig/day.  . Alcohol use No  . Drug use: No  . Sexual activity: Not Currently   Other Topics Concern  . Not on file   Social History Narrative   ** Merged History Encounter **       Married   Family History  Problem Relation Age of Onset  . Stroke Mother   . Seizures Father   . Heart disease Father   . Diabetes Sister   . Asthma Maternal Aunt        aunts  . Asthma Maternal Uncle        uncles  . Heart disease Paternal Aunt        aunts  . Heart disease Paternal Uncle        uncles  . Heart disease Maternal Aunt        aunts  . Heart disease Maternal Uncle        uncles  . Heart disease Maternal Grandfather   . Colon cancer Neg Hx   . Colon polyps Neg Hx   . Esophageal cancer Neg Hx   . Kidney disease Neg Hx   . Gallbladder disease Neg Hx    O: Component Value Date/Time   CHOL 144 07/21/2014 1636   HDL 54 07/21/2014 1636   TRIG 149 07/21/2014 1636   AST 15 02/08/2017 1551   ALT 19 02/08/2017 1551   NA 146 (H) 05/31/2017 0957   K 3.5 05/31/2017 0957   CL 99 05/31/2017 0957   CO2 33 (H) 05/31/2017 0957   GLUCOSE 107 (H) 05/31/2017 0957   GLUCOSE 186 (H) 10/14/2016 0659   HGBA1C 8.4 02/08/2017 1459   HGBA1C 8.8 (H) 10/13/2016 0528   BUN 14 05/31/2017 0957   CREATININE 0.88 05/31/2017 0957   CREATININE 0.88 06/16/2016 1338   CALCIUM 8.7 05/31/2017 0957   GFRAA 81 05/31/2017 0957   GFRAA 87 02/13/2015 1015   WBC 9.6 02/08/2017 1551   WBC 14.6 (H) 10/14/2016 0659   HGB 14.1 02/08/2017 1551   HCT 44.5 02/08/2017 1551   PLT 246 02/08/2017 1551   TSH 0.603 02/10/2016 1609   TSH 0.415 08/06/2014 0939   Ht Readings from Last 2 Encounters:  03/02/17 5' (1.524 m)  02/08/17 5' (1.524 m)   Wt Readings from Last 2 Encounters:  05/31/17 226 lb 6.4 oz (102.7 kg)  05/17/17 224 lb 4.8 oz (101.7 kg)   There is no height or weight on file to calculate BMI. BP Readings from  Last 3 Encounters:  05/31/17 (!) 142/82  05/17/17 (!) 174/99  05/03/17 (!) 134/53   A/P: Medications were reviewed with the patient, including name, instructions, indication, goals of therapy,  potential side effects, importance of adherence, and safe use. Patient reports being unable to afford insulin and inhalers. Provided samples today, applied for Medicare Extra Help.[name]  An after visit summary was provided and patient advised to follow up if any changes in condition or questions regarding medications arise.  Patient verbalized understanding by repeating back information and was advised to contact me if further medication-related questions arise. Patient was also provided an information handout.

## 2017-06-15 ENCOUNTER — Ambulatory Visit
Admission: RE | Admit: 2017-06-15 | Discharge: 2017-06-15 | Disposition: A | Discharge status: Home / Self Care | Payer: Medicare Other | Source: Ambulatory Visit | Attending: Internal Medicine | Admitting: Internal Medicine

## 2017-06-15 DIAGNOSIS — Z1231 Encounter for screening mammogram for malignant neoplasm of breast: Secondary | ICD-10-CM | POA: Diagnosis not present

## 2017-06-21 ENCOUNTER — Encounter: Payer: Self-pay | Admitting: Internal Medicine

## 2017-06-21 ENCOUNTER — Ambulatory Visit (INDEPENDENT_AMBULATORY_CARE_PROVIDER_SITE_OTHER): Payer: Medicare Other | Admitting: Internal Medicine

## 2017-06-21 VITALS — BP 154/98 | HR 83 | Temp 98.2°F | Wt 223.4 lb

## 2017-06-21 DIAGNOSIS — I10 Essential (primary) hypertension: Secondary | ICD-10-CM | POA: Diagnosis not present

## 2017-06-21 DIAGNOSIS — Z79899 Other long term (current) drug therapy: Secondary | ICD-10-CM | POA: Diagnosis not present

## 2017-06-21 DIAGNOSIS — Z7951 Long term (current) use of inhaled steroids: Secondary | ICD-10-CM | POA: Diagnosis not present

## 2017-06-21 DIAGNOSIS — Z823 Family history of stroke: Secondary | ICD-10-CM

## 2017-06-21 DIAGNOSIS — Z794 Long term (current) use of insulin: Secondary | ICD-10-CM | POA: Diagnosis not present

## 2017-06-21 DIAGNOSIS — E119 Type 2 diabetes mellitus without complications: Secondary | ICD-10-CM

## 2017-06-21 DIAGNOSIS — E1142 Type 2 diabetes mellitus with diabetic polyneuropathy: Secondary | ICD-10-CM

## 2017-06-21 DIAGNOSIS — Z23 Encounter for immunization: Secondary | ICD-10-CM | POA: Diagnosis not present

## 2017-06-21 DIAGNOSIS — Z820 Family history of epilepsy and other diseases of the nervous system: Secondary | ICD-10-CM | POA: Diagnosis not present

## 2017-06-21 DIAGNOSIS — J449 Chronic obstructive pulmonary disease, unspecified: Secondary | ICD-10-CM | POA: Diagnosis not present

## 2017-06-21 DIAGNOSIS — Z825 Family history of asthma and other chronic lower respiratory diseases: Secondary | ICD-10-CM | POA: Diagnosis not present

## 2017-06-21 DIAGNOSIS — Z9981 Dependence on supplemental oxygen: Secondary | ICD-10-CM

## 2017-06-21 DIAGNOSIS — F17210 Nicotine dependence, cigarettes, uncomplicated: Secondary | ICD-10-CM

## 2017-06-21 DIAGNOSIS — Z8249 Family history of ischemic heart disease and other diseases of the circulatory system: Secondary | ICD-10-CM

## 2017-06-21 DIAGNOSIS — Z833 Family history of diabetes mellitus: Secondary | ICD-10-CM

## 2017-06-21 LAB — POCT GLYCOSYLATED HEMOGLOBIN (HGB A1C): Hemoglobin A1C: 9.4

## 2017-06-21 LAB — GLUCOSE, CAPILLARY: Glucose-Capillary: 277 mg/dL — ABNORMAL HIGH (ref 65–99)

## 2017-06-21 MED ORDER — METFORMIN HCL ER 500 MG PO TB24: 2000.0000 mg | ORAL_TABLET | Freq: Every day | ORAL | 3 refills | Status: DC

## 2017-06-21 MED ORDER — INSULIN GLARGINE 100 UNIT/ML SOLOSTAR PEN: 35.0000 [IU] | PEN_INJECTOR | Freq: Every day | SUBCUTANEOUS | 11 refills | Status: DC

## 2017-06-21 MED ORDER — ATORVASTATIN CALCIUM 80 MG PO TABS: 80.0000 mg | ORAL_TABLET | Freq: Every day | ORAL | 3 refills | Status: DC

## 2017-06-21 MED ORDER — LISINOPRIL 20 MG PO TABS: 40.0000 mg | ORAL_TABLET | Freq: Every day | ORAL | 3 refills | Status: DC

## 2017-06-21 MED ORDER — INSULIN GLARGINE 100 UNITS/ML SOLOSTAR PEN: 35.0000 [IU] | PEN_INJECTOR | Freq: Every day | SUBCUTANEOUS | 11 refills | Status: DC

## 2017-06-21 MED ORDER — TIOTROPIUM BROMIDE MONOHYDRATE 18 MCG IN CAPS: 18.0000 ug | ORAL_CAPSULE | Freq: Every day | RESPIRATORY_TRACT | 11 refills | Status: DC

## 2017-06-21 MED ORDER — INSULIN LISPRO 100 UNIT/ML (KWIKPEN): PEN_INJECTOR | SUBCUTANEOUS | 3 refills | Status: DC

## 2017-06-21 NOTE — Addendum Note (Signed)
Addended by: Forde Dandy on: 06/21/2017 12:28 PM   Modules accepted: Orders

## 2017-06-21 NOTE — Assessment & Plan Note (Signed)
Last bp here at clinic was 142/82, historically it has been high on the last several visits.  Today her blood pressure was 154/98.  We decided to increase her lisinopril to 40 mg daily. The patient likes this plan as she did not have to add on another medication for the time being.  She also takes metoprolol 100 mg daily, and Lasix 60 mg twice daily.  -increased lisinopril to 40 mg daily

## 2017-06-21 NOTE — Addendum Note (Signed)
Addended by: Forde Dandy on: 06/21/2017 10:28 AM   Modules accepted: Orders

## 2017-06-21 NOTE — Assessment & Plan Note (Addendum)
Patient reports taking blood sugar 3x per day in her fasting it is usually around 130, with 1-2 lows in the am per month. lunch time is around 125-145, at dinner it's usually around 80 before taking her mealtime insulin so she often doesn't take her prandial insulin at night.  Unfortunately patient did not bring her glucometer with her today and did not record her blood sugars at home on a log.  Her hemoglobin A1c today is 9.4 which is up from 8.4 in May of this year.  We asked that she returned in one month after keeping a log of her blood sugars and bring in her glucometer.  It is my feeling based off her history that we will need to increase the prandial insulin and decrease the long-acting bringing the total towards a more one-to-one ratio.  in the meantime, I discussed with the patient that she can also work on changing her diet and increasing her exercise which she admits has been less than ideal lately.  -Continue existing insulin regimen of 35 units Lantus daily and 7, 7, 5 units of NovoLog with meals,  and glipizide 5 mg twice daily -Once patient returns with an accurate log of her blood sugars  we can adjust as necessary.

## 2017-06-21 NOTE — Progress Notes (Signed)
CC: HTN, T2DM, COPD follow up, pt switching pharmacies  HPI:  [name] is a 66 y.o. to follow up on her diabetes and hypertension, and COPD. She has recently transferred to [facility] pharmacy, the pharmacy staff thought that maybe she needed her prescriptions transferred there.  However, in speaking with the patient she does not have this issue. Instead we focused on her diabetes, hypertension, and COPD. Please see A&P for status of the patient's chronic medical conditions  Past Medical History:  Diagnosis Date  . ATN (acute tubular necrosis) ([place]) 07/15/2014  . Automatic implantable cardioverter-defibrillator in situ   . Cardiac defibrillator in situ    Atlas II VR (SJM) implanted by Dr Lovena Le  . CHF (congestive heart failure) ([place])   . Chronic combined systolic and diastolic heart failure (HCC)    a. EF 35-40% in past;  b. Echo 7/13:  EF 45-50%, Gr 2 diast dysfn, mild AI, mild MAC, trivial MR, mild LAE, PASP 47.  [name] Chronic ulcer of leg ([place])    04-09-15 resolved-not a problem.  . Colon polyps 04/12/2013   Rectosigmoid polyp  . COPD (chronic obstructive pulmonary disease) (HCC)    O2 at night  . Depression   . Diabetes mellitus   . Eczema   . Elevated alkaline phosphatase level    GGT and 5'nucleotidase 8/13 normal  . History of oxygen administration    oxygen @ 2 l/m nasally bedtime 24/7  . HTN (hypertension)   . Hx of cardiac cath    a. [place] 2003 normal;  b. LHC 6/13:  Mild calcification in the LM, o/w normal coronary arteries, EF 45%.   . Hyperlipidemia   . Hyperthyroidism, subclinical   . Implantable cardioverter-defibrillator (ICD) generator end of life   . Lipoma   . NICM (nonischemic cardiomyopathy) ([place])   . Obesity   . On home oxygen therapy    "2L; 24/7" (10/11/2016)  . Post menopausal syndrome   . Sleep apnea    pt denies 04/12/2013   Review of Systems:  ROS: Pulmonary: pt denies increased work of breathing, shortness of breath,  Cardiac: pt denies  palpitations, chest pain,  Abdominal: pt denies abdominal pain, nausea, vomiting, or diarrhea  Physical Exam:  Vitals:   06/21/17 0948  BP: (!) 154/98  Pulse: 83  Temp: 98.2 F (36.8 C)  TempSrc: Oral  SpO2: 97%  Weight: 223 lb 6.4 oz (101.3 kg)   Physical Exam  Constitutional: She is oriented to person, place, and time. She appears well-developed and well-nourished.  Eyes: Right eye exhibits no discharge. Left eye exhibits no discharge. No scleral icterus.  Cardiovascular: Normal rate, regular rhythm, normal heart sounds and intact distal pulses.  Exam reveals no friction rub.   No murmur heard. Pulmonary/Chest: Effort normal and breath sounds normal. No respiratory distress. She has no wheezes. She has no rales.  Abdominal: Soft. Bowel sounds are normal. She exhibits no distension. There is no tenderness.  Musculoskeletal: She exhibits edema (1+ lowere extremity edema bilaterally).  Neurological: She is alert and oriented to person, place, and time.  Skin: Skin is warm and dry. No erythema.     Social History   Social History  . Marital status: Widowed    Spouse name: Alroy Dust  . Number of children: 3  . Years of education: 38   Occupational History  . disabled    Social History Main Topics  . Smoking status: Current Every Day Smoker    Packs/day: 0.30  Years: 39.00    Types: Cigarettes  . Smokeless tobacco: Never Used     Comment: 1 cig/day.  . Alcohol use No  . Drug use: No  . Sexual activity: Not Currently   Other Topics Concern  . Not on file   Social History Narrative   ** Merged History Encounter **       Married    Family History  Problem Relation Age of Onset  . Stroke Mother   . Seizures Father   . Heart disease Father   . Diabetes Sister   . Asthma Maternal Aunt        aunts  . Asthma Maternal Uncle        uncles  . Heart disease Paternal Aunt        aunts  . Heart disease Paternal Uncle        uncles  . Heart disease Maternal  Aunt        aunts  . Heart disease Maternal Uncle        uncles  . Heart disease Maternal Grandfather   . Colon cancer Neg Hx   . Colon polyps Neg Hx   . Esophageal cancer Neg Hx   . Kidney disease Neg Hx   . Gallbladder disease Neg Hx     Assessment & Plan:   See Encounters Tab for problem based charting.  Patient seen with Dr. Dareen Piano

## 2017-06-21 NOTE — Patient Instructions (Signed)
We have increased your lisinopril to 40mg  per day and sent the prescription to your new pharmacy.  We will leave your insulin and glipizide as is for now and have you continue to check your sugars like you are doing 3 times per day but next visit in about one month please bring in your meter.  Also work on those dietary changes we discussed and increase your amount of exercise.

## 2017-06-21 NOTE — Assessment & Plan Note (Signed)
Pt denies any shortness of breath and is doing well on advair and spiriva.  She is on 2L O2 at home however, in clinic, is 97% on room air.  She mainly uses her oxygen at night or when she exerts herself.    -continue advair and spiriva

## 2017-06-26 NOTE — Progress Notes (Signed)
Internal Medicine Clinic Attending  I saw and evaluated the patient.  I personally confirmed the key portions of the history and exam documented by Dr. Winfrey and I reviewed pertinent patient test results.  The assessment, diagnosis, and plan were formulated together and I agree with the documentation in the resident's note. 

## 2017-06-29 DIAGNOSIS — J449 Chronic obstructive pulmonary disease, unspecified: Secondary | ICD-10-CM | POA: Diagnosis not present

## 2017-07-05 ENCOUNTER — Ambulatory Visit (INDEPENDENT_AMBULATORY_CARE_PROVIDER_SITE_OTHER): Payer: Medicare Other | Admitting: *Deleted

## 2017-07-05 DIAGNOSIS — I428 Other cardiomyopathies: Secondary | ICD-10-CM

## 2017-07-05 DIAGNOSIS — I5022 Chronic systolic (congestive) heart failure: Secondary | ICD-10-CM

## 2017-07-05 DIAGNOSIS — I5042 Chronic combined systolic (congestive) and diastolic (congestive) heart failure: Secondary | ICD-10-CM

## 2017-07-05 LAB — CUP PACEART INCLINIC DEVICE CHECK
Battery Remaining Longevity: 73 mo
Brady Statistic RV Percent Paced: 0 %
Date Time Interrogation Session: 20181003111736
HighPow Impedance: 46.949
Implantable Lead Implant Date: 20080801
Implantable Lead Location: 753860
Implantable Lead Model: 7120
Implantable Pulse Generator Implant Date: 20150701
Lead Channel Impedance Value: 437.5 Ohm
Lead Channel Pacing Threshold Amplitude: 0.5 V
Lead Channel Pacing Threshold Amplitude: 0.5 V
Lead Channel Pacing Threshold Pulse Width: 0.5 ms
Lead Channel Pacing Threshold Pulse Width: 0.5 ms
Lead Channel Sensing Intrinsic Amplitude: 11.9 mV
Lead Channel Setting Pacing Amplitude: 2.5 V
Lead Channel Setting Pacing Pulse Width: 0.5 ms
Lead Channel Setting Sensing Sensitivity: 0.5 mV
Pulse Gen Serial Number: 7206538

## 2017-07-05 NOTE — Progress Notes (Signed)
ICD check in clinic. Normal device function. Threshold and sensing consistent with previous device measurements. Impedance trends stable over time. No evidence of any ventricular arrhythmias. Histogram distribution appropriate for patient and level of activity. [place] at baseline. No changes made this session. Device programmed at appropriate safety margins. Device programmed to optimize intrinsic conduction. Estimated longevity 6.1 years. ROV with GT in Jan- schedule not open at this time, recall in [facility].

## 2017-07-12 ENCOUNTER — Emergency Department (HOSPITAL_COMMUNITY): Payer: Medicare Other

## 2017-07-12 ENCOUNTER — Emergency Department (HOSPITAL_COMMUNITY)
Admission: EM | Admit: 2017-07-12 | Discharge: 2017-07-12 | Disposition: A | Discharge status: Home / Self Care | Payer: Medicare Other | Source: Home / Self Care | Attending: Emergency Medicine | Admitting: Emergency Medicine

## 2017-07-12 ENCOUNTER — Encounter (HOSPITAL_COMMUNITY): Payer: Self-pay | Admitting: Emergency Medicine

## 2017-07-12 DIAGNOSIS — E0590 Thyrotoxicosis, unspecified without thyrotoxic crisis or storm: Secondary | ICD-10-CM | POA: Insufficient documentation

## 2017-07-12 DIAGNOSIS — F17210 Nicotine dependence, cigarettes, uncomplicated: Secondary | ICD-10-CM

## 2017-07-12 DIAGNOSIS — E119 Type 2 diabetes mellitus without complications: Secondary | ICD-10-CM

## 2017-07-12 DIAGNOSIS — I110 Hypertensive heart disease with heart failure: Secondary | ICD-10-CM | POA: Insufficient documentation

## 2017-07-12 DIAGNOSIS — R0602 Shortness of breath: Secondary | ICD-10-CM | POA: Insufficient documentation

## 2017-07-12 DIAGNOSIS — F329 Major depressive disorder, single episode, unspecified: Secondary | ICD-10-CM

## 2017-07-12 DIAGNOSIS — Z9981 Dependence on supplemental oxygen: Secondary | ICD-10-CM

## 2017-07-12 DIAGNOSIS — I509 Heart failure, unspecified: Secondary | ICD-10-CM | POA: Diagnosis not present

## 2017-07-12 DIAGNOSIS — Z794 Long term (current) use of insulin: Secondary | ICD-10-CM

## 2017-07-12 DIAGNOSIS — Z95810 Presence of automatic (implantable) cardiac defibrillator: Secondary | ICD-10-CM

## 2017-07-12 DIAGNOSIS — Z7982 Long term (current) use of aspirin: Secondary | ICD-10-CM | POA: Insufficient documentation

## 2017-07-12 DIAGNOSIS — Z79899 Other long term (current) drug therapy: Secondary | ICD-10-CM | POA: Insufficient documentation

## 2017-07-12 DIAGNOSIS — J449 Chronic obstructive pulmonary disease, unspecified: Secondary | ICD-10-CM | POA: Insufficient documentation

## 2017-07-12 DIAGNOSIS — I5042 Chronic combined systolic (congestive) and diastolic (congestive) heart failure: Secondary | ICD-10-CM | POA: Insufficient documentation

## 2017-07-12 LAB — CBC WITH DIFFERENTIAL/PLATELET
Basophils Absolute: 0 10*3/uL (ref 0.0–0.1)
Basophils Relative: 0 %
Eosinophils Absolute: 0.2 10*3/uL (ref 0.0–0.7)
Eosinophils Relative: 3 %
HCT: 41.6 % (ref 36.0–46.0)
Hemoglobin: 13.8 g/dL (ref 12.0–15.0)
Lymphocytes Relative: 23 %
Lymphs Abs: 1.7 10*3/uL (ref 0.7–4.0)
MCH: 29.1 pg (ref 26.0–34.0)
MCHC: 33.2 g/dL (ref 30.0–36.0)
MCV: 87.6 fL (ref 78.0–100.0)
Monocytes Absolute: 0.5 10*3/uL (ref 0.1–1.0)
Monocytes Relative: 6 %
Neutro Abs: 5.2 10*3/uL (ref 1.7–7.7)
Neutrophils Relative %: 68 %
Platelets: 150 10*3/uL (ref 150–400)
RBC: 4.75 MIL/uL (ref 3.87–5.11)
RDW: 16.2 % — ABNORMAL HIGH (ref 11.5–15.5)
WBC: 7.6 10*3/uL (ref 4.0–10.5)

## 2017-07-12 LAB — BASIC METABOLIC PANEL WITH GFR
Anion gap: 11 (ref 5–15)
BUN: 8 mg/dL (ref 6–20)
CO2: 28 mmol/L (ref 22–32)
Calcium: 8.3 mg/dL — ABNORMAL LOW (ref 8.9–10.3)
Chloride: 100 mmol/L — ABNORMAL LOW (ref 101–111)
Creatinine, Ser: 0.84 mg/dL (ref 0.44–1.00)
GFR calc Af Amer: >60 mL/min
GFR calc non Af Amer: >60 mL/min
Glucose, Bld: 206 mg/dL — ABNORMAL HIGH (ref 65–99)
Potassium: 3.3 mmol/L — ABNORMAL LOW (ref 3.5–5.1)
Sodium: 139 mmol/L (ref 135–145)

## 2017-07-12 LAB — TROPONIN I: Troponin I: 0.04 ng/mL

## 2017-07-12 LAB — BRAIN NATRIURETIC PEPTIDE: B Natriuretic Peptide: 200.6 pg/mL — ABNORMAL HIGH (ref 0.0–100.0)

## 2017-07-12 MED ORDER — IPRATROPIUM-ALBUTEROL 0.5-2.5 (3) MG/3ML IN SOLN
3.0000 mL | Freq: Once | RESPIRATORY_TRACT | Status: AC
  Administered 2017-07-12: 3 mL via RESPIRATORY_TRACT
  Filled 2017-07-12: qty 3

## 2017-07-12 MED ORDER — HYDRALAZINE HCL 20 MG/ML IJ SOLN
10.0000 mg | Freq: Once | INTRAMUSCULAR | Status: AC
  Administered 2017-07-12: 10 mg via INTRAVENOUS
  Filled 2017-07-12: qty 1

## 2017-07-12 MED ORDER — FUROSEMIDE 20 MG PO TABS
60.0000 mg | ORAL_TABLET | Freq: Three times a day (TID) | ORAL | 0 refills | Status: DC
Start: 2017-07-12 — End: 2017-07-15

## 2017-07-12 MED ORDER — POTASSIUM CHLORIDE CRYS ER 20 MEQ PO TBCR
40.0000 meq | EXTENDED_RELEASE_TABLET | Freq: Once | ORAL | Status: AC
  Administered 2017-07-12: 40 meq via ORAL
  Filled 2017-07-12: qty 2

## 2017-07-12 MED ORDER — FUROSEMIDE 10 MG/ML IJ SOLN
60.0000 mg | Freq: Once | INTRAMUSCULAR | Status: AC
  Administered 2017-07-12: 60 mg via INTRAVENOUS
  Filled 2017-07-12: qty 6

## 2017-07-12 MED ORDER — POTASSIUM CHLORIDE CRYS ER 20 MEQ PO TBCR: 20.0000 meq | EXTENDED_RELEASE_TABLET | Freq: Two times a day (BID) | ORAL | 0 refills | Status: DC

## 2017-07-12 NOTE — Discharge Instructions (Signed)
Increase your lasix to 60mg  three times per day for the next 3 days . Take extra potassium supplementation during this. After three days then resume previous twice per day dosing. FU with your PCP.

## 2017-07-12 NOTE — ED Triage Notes (Signed)
Pt in from home via [facility] with c/o sob x 2 days. Per EMS, pt has broken nebulizer at home, was unable to take home treatments. Given 5mg  Albuterol en route. Hx of COPD, CHF, smoker. Sats 93% on RA, alert and oriented

## 2017-07-12 NOTE — ED Notes (Signed)
Patient transported to X-ray 

## 2017-07-12 NOTE — ED Provider Notes (Signed)
[facility] DEPT Provider Note   CSN: [id number] Arrival date & time: 07/12/17  1314     History   Chief Complaint Chief Complaint  Patient presents with  . Shortness of Breath    HPI [name] is a 66 y.o. female.  HPI   66 year old female with dyspnea. Onset 2 days ago. Persistent since then. She past history of CHF, COPD and continues to smoke. She is on 2 L of oxygen chronically. Apparently her nebulizers broke. Is able to give her self treatments. She was given 5 mg of albuterol by EMS with some improvement. They noted wheezing on their initial assessment. Patient endorses orthopnea although reports this is chronic. Denies any acute change. No fevers or chills. Nonproductive cough. Chronic lower extremity edema just worse in the past several days. Denies any acute pain. No fevers or chills.  Normal coronaries on catheterization one year ago.   Past Medical History:  Diagnosis Date  . ATN (acute tubular necrosis) ([place]) 07/15/2014  . Automatic implantable cardioverter-defibrillator in situ   . Cardiac defibrillator in situ    Atlas II VR (SJM) implanted by Dr Lovena Le  . CHF (congestive heart failure) ([place])   . Chronic combined systolic and diastolic heart failure (HCC)    a. EF 35-40% in past;  b. Echo 7/13:  EF 45-50%, Gr 2 diast dysfn, mild AI, mild MAC, trivial MR, mild LAE, PASP 47.  [name] Chronic ulcer of leg ([employer])    04-09-15 resolved-not a problem.  . Colon polyps 04/12/2013   Rectosigmoid polyp  . COPD (chronic obstructive pulmonary disease) (HCC)    O2 at night  . Depression   . Diabetes mellitus   . Eczema   . Elevated alkaline phosphatase level    GGT and 5'nucleotidase 8/13 normal  . History of oxygen administration    oxygen @ 2 l/m nasally bedtime 24/7  . HTN (hypertension)   . Hx of cardiac cath    a. [place] 2003 normal;  b. [facility] 6/13:  Mild calcification in the LM, o/w normal coronary arteries, EF 45%.   . Hyperlipidemia   . Hyperthyroidism,  subclinical   . Implantable cardioverter-defibrillator (ICD) generator end of life   . Lipoma   . NICM (nonischemic cardiomyopathy) ([place])   . Obesity   . On home oxygen therapy    "2L; 24/7" (10/11/2016)  . Post menopausal syndrome   . Sleep apnea    pt denies 04/12/2013    Patient Active Problem List   Diagnosis Date Noted  . Skin rash 05/03/2017  . Chronic combined systolic and diastolic congestive heart failure ([place]) 03/18/2016  . Nocturnal hypoxia 02/24/2016  . Spinal stenosis of lumbar region 12/09/2015  . COPD (chronic obstructive pulmonary disease) ([place]) 06/24/2015  . Diabetic gastroparesis associated with type 2 diabetes mellitus ([place]) 07/12/2014  . Depression 05/22/2014  . Health care maintenance 01/22/2013  . Nonischemic cardiomyopathy ([place]) 04/03/2012  . Diabetes mellitus, type 2 ([place]) 04/03/2012  . Abdominal pain 04/03/2012  . HYPERTHYROIDISM, SUBCLINICAL 05/06/2009  . Hyperlipidemia associated with type 2 diabetes mellitus ([place]) 05/25/2007  . HYPERTENSION, BENIGN ESSENTIAL 05/25/2007  . Automatic implantable cardioverter-defibrillator in situ 05/04/2007    Past Surgical History:  Procedure Laterality Date  . ABDOMINAL HYSTERECTOMY    . CARDIAC CATHETERIZATION    . CARDIAC CATHETERIZATION N/A 06/21/2016   Procedure: Left Heart Cath and Coronary Angiography;  Surgeon: Sherren Mocha, MD;  Location: [facility];  Service: Cardiovascular;  Laterality: N/A;  . CARDIAC  DEFIBRILLATOR PLACEMENT  05/04/2007   SJM Atlas II VR ICD  . CARDIAC DEFIBRILLATOR PLACEMENT    . COLONOSCOPY N/A 04/12/2013   Procedure: COLONOSCOPY;  Surgeon: Beryle Beams, MD;  Location: [facility];  Service: Endoscopy;  Laterality: N/A;  pt.has defibrilator  . COLONOSCOPY N/A 04/16/2015   Procedure: COLONOSCOPY;  Surgeon: [name], MD;  Location: [facility];  Service: Endoscopy;  Laterality: N/A;  . ESOPHAGOGASTRODUODENOSCOPY N/A 04/16/2015   Procedure: ESOPHAGOGASTRODUODENOSCOPY  (EGD);  Surgeon: [name], MD;  Location: [facility];  Service: Endoscopy;  Laterality: N/A;  . HERNIA REPAIR    . HYSTEROSCOPY    . IMPLANTABLE CARDIOVERTER DEFIBRILLATOR (ICD) GENERATOR CHANGE N/A 04/02/2014   Procedure: ICD GENERATOR CHANGE;  Surgeon: Evans Lance, MD;  Location: [facility] C[facility] LAB;  Service: Cardiovascular;  Laterality: N/A;  . INSERT / REPLACE / REMOVE PACEMAKER    . LEFT HEART CATHETERIZATION WITH CORONARY ANGIOGRAM N/A 04/02/2012   Procedure: LEFT HEART CATHETERIZATION WITH CORONARY ANGIOGRAM;  Surgeon: Hillary Bow, MD;  Location: [facility] CATH LAB;  Service: Cardiovascular;  Laterality: N/A;  . TUBAL LIGATION      OB History    No data available       Home Medications    Prior to Admission medications   Medication Sig Start Date End Date Taking? Authorizing Provider  albuterol (PROVENTIL) (2.5 MG/3ML) 0.083% nebulizer solution USE 1 VIAL VIA NEBULIZER EVERY 6 HOURS AS NEEDED FOR WHEEZING OR SHORTNESS OF BREATH 11/22/16   Burns, Arloa Koh, MD  aspirin EC 81 MG tablet Take 1 tablet (81 mg total) by mouth daily. 11/13/15   Burns, Arloa Koh, MD  atorvastatin (LIPITOR) 80 MG tablet Take 1 tablet (80 mg total) by mouth at bedtime. 06/21/17   Kathi Ludwig, MD  betamethasone dipropionate (DIPROLENE) 0.05 % cream Apply topically 2 (two) times daily. 05/31/17   Shela Leff, MD  diphenhydrAMINE (BENADRYL) 25 mg capsule Take 1 capsule (25 mg total) by mouth every 6 (six) hours as needed. 05/17/17 05/17/18  Shela Leff, MD  feeding supplement, GLUCERNA SHAKE, (GLUCERNA SHAKE) LIQD Take 237 mLs by mouth 3 (three) times daily between meals. Patient not taking: Reported on 07/05/2017 10/14/16   Florencia Reasons, MD  Fluticasone-Salmeterol (ADVAIR DISKUS) 250-50 MCG/DOSE AEPB Inhale 1 puff into the lungs 2 (two) times daily. 05/31/17 05/31/18  Shela Leff, MD  furosemide (LASIX) 20 MG tablet Take 3 tablets (60 mg total) by mouth 2 (two) times daily. 11/22/16   Burns, Arloa Koh,  MD  glipiZIDE (GLUCOTROL) 5 MG tablet Take 1 tablet (5 mg total) by mouth 2 (two) times daily before a meal. 03/02/17   Riccardo Dubin, MD  Insulin Glargine (LANTUS) 100 UNIT/ML Solostar Pen Inject 35 Units into the skin daily. 06/21/17   Katherine Roan, MD  insulin lispro (HUMALOG KWIKPEN) 100 UNIT/ML KiwkPen Take 7 units with breakfast, 7 units with lunch, and 5 units with dinner. 06/21/17   Katherine Roan, MD  lisinopril (PRINIVIL,ZESTRIL) 20 MG tablet Take 2 tablets (40 mg total) by mouth daily. 06/21/17 10/19/17  Katherine Roan, MD  metFORMIN (GLUCOPHAGE-XR) 500 MG 24 hr tablet Take 4 tablets (2,000 mg total) by mouth daily with breakfast. 06/21/17   Forde Dandy, PharmD  metoprolol succinate (TOPROL-XL) 50 MG 24 hr tablet Take 2 tablets (100 mg total) by mouth daily. 03/02/17   Riccardo Dubin, MD  nitroGLYCERIN (NITROSTAT) 0.4 MG SL tablet Place 0.4 mg under the tongue every 5 (five) minutes  as needed for chest pain. Reported on 03/18/2016    [provider]  pantoprazole (PROTONIX) 40 MG tablet Take 1 tablet (40 mg total) by mouth daily. 02/08/17   Burns, Arloa Koh, MD  tiotropium (SPIRIVA) 18 MCG inhalation capsule Place 1 capsule (18 mcg total) into inhaler and inhale daily. 06/21/17   Kathi Ludwig, MD    Family History Family History  Problem Relation Age of Onset  . Stroke Mother   . Seizures Father   . Heart disease Father   . Diabetes Sister   . Asthma Maternal Aunt        aunts  . Asthma Maternal Uncle        uncles  . Heart disease Paternal Aunt        aunts  . Heart disease Paternal Uncle        uncles  . Heart disease Maternal Aunt        aunts  . Heart disease Maternal Uncle        uncles  . Heart disease Maternal Grandfather   . Colon cancer Neg Hx   . Colon polyps Neg Hx   . Esophageal cancer Neg Hx   . Kidney disease Neg Hx   . Gallbladder disease Neg Hx     Social History Social History  Substance Use Topics  . Smoking status:  Current Every Day Smoker    Packs/day: 0.30    Years: 39.00    Types: Cigarettes  . Smokeless tobacco: Never Used     Comment: 1 cig/day.  . Alcohol use No     Allergies   Actos [pioglitazone]; Naproxen; Rosiglitazone; and Hydrocortisone   Review of Systems Review of Systems  All systems reviewed and negative, other than as noted in HPI.  Physical Exam Updated Vital Signs BP (!) 182/84 (BP Location: Right Arm)   Pulse 86   Temp 97.8 F (36.6 C) (Oral)   Resp 20   Ht _0  (1.651 m)   Wt 101.2 kg (223 lb)   SpO2 93%   BMI 37.11 kg/m   Physical Exam  Constitutional: She appears well-developed and well-nourished. No distress.  HENT:  Head: Normocephalic and atraumatic.  Eyes: Conjunctivae are normal. Right eye exhibits no discharge. Left eye exhibits no discharge.  Neck: Neck supple.  Cardiovascular: Normal rate, regular rhythm and normal heart sounds.  Exam reveals no gallop and no friction rub.   No murmur heard. Pulmonary/Chest:  Decreased breath sounds b/l bases. Scattered crackles.   Abdominal: Soft. She exhibits no distension. There is no tenderness.  Musculoskeletal: She exhibits edema. She exhibits no tenderness.  Symmetric pitting LE edema.   Neurological: She is alert.  Skin: Skin is warm and dry.  Psychiatric: She has a normal mood and affect. Her behavior is normal. Thought content normal.  Nursing note and vitals reviewed.    ED Treatments / Results  Labs (all labs ordered are listed, but only abnormal results are displayed) Labs Reviewed  CBC WITH DIFFERENTIAL/PLATELET - Abnormal; Notable for the following:       Result Value   RDW 16.2 (*)    All other components within normal limits  BASIC METABOLIC PANEL - Abnormal; Notable for the following:    Potassium 3.3 (*)    Chloride 100 (*)    Glucose, Bld 206 (*)    Calcium 8.3 (*)    All other components within normal limits  BRAIN NATRIURETIC PEPTIDE - Abnormal; Notable for the following:  B Natriuretic Peptide 200.6 (*)    All other components within normal limits  TROPONIN I - Abnormal; Notable for the following:    Troponin I 0.04 (*)    All other components within normal limits    EKG  EKG Interpretation None       Radiology Dg Chest 2 View  Result Date: 07/12/2017 CLINICAL DATA:  Shortness of Breath EXAM: CHEST  2 VIEW COMPARISON:  10/13/2016 FINDINGS: Left AICD remains in place, unchanged. Cardiomegaly with vascular congestion. Mild peribronchial thickening and interstitial prominence could reflect mild interstitial edema, improved since prior study. No effusions or acute bony abnormality. IMPRESSION: Cardiomegaly with vascular congestion and possible early interstitial edema. Electronically Signed   By: Rolm Baptise M.D.   On: 07/12/2017 13:50    Procedures Procedures (including critical care time)  Medications Ordered in ED Medications  ipratropium-albuterol (DUONEB) 0.5-2.5 (3) MG/3ML nebulizer solution 3 mL (not administered)     Initial Impression / Assessment and Plan / ED Course  I have reviewed the triage vital signs and the nursing notes.  Pertinent labs & imaging results that were available during my care of the patient were reviewed by me and considered in my medical decision making (see chart for details).     66 year old female with dyspnea. Clinically suspect this is heart failure as opposed to COPD exacerbation. I cannot appreciate any wheezing on my exam although EMS reports auscultating wheezing and she had a neb prior to my exam.   Mild elevation of BNP. Chest x-ray with changes consistent with mild pulmonary edema. Her troponin is minimally elevated. Per review of records, this appears to be a chronic finding. She denies any acute pain. No obstructive CAD on cardiac catheterization 1 year ago. EKG is not acutely changed from priors.   Final Clinical Impressions(s) / ED Diagnoses   Final diagnoses:  SOB (shortness of breath)     New Prescriptions New Prescriptions   No medications on file     Virgel Manifold, MD 07/19/17 [phone]

## 2017-07-14 ENCOUNTER — Inpatient Hospital Stay (HOSPITAL_COMMUNITY)
Admission: EM | Admit: 2017-07-14 | Discharge: 2017-07-17 | DRG: 292 | Disposition: A | Discharge status: Home / Self Care | Payer: Medicare Other | Attending: Internal Medicine | Admitting: Internal Medicine

## 2017-07-14 ENCOUNTER — Other Ambulatory Visit (HOSPITAL_COMMUNITY): Payer: Self-pay

## 2017-07-14 ENCOUNTER — Other Ambulatory Visit: Payer: Self-pay

## 2017-07-14 ENCOUNTER — Emergency Department (HOSPITAL_COMMUNITY): Payer: Medicare Other

## 2017-07-14 ENCOUNTER — Encounter (HOSPITAL_COMMUNITY): Payer: Self-pay

## 2017-07-14 DIAGNOSIS — E669 Obesity, unspecified: Secondary | ICD-10-CM | POA: Diagnosis present

## 2017-07-14 DIAGNOSIS — N179 Acute kidney failure, unspecified: Secondary | ICD-10-CM | POA: Diagnosis not present

## 2017-07-14 DIAGNOSIS — R0602 Shortness of breath: Secondary | ICD-10-CM

## 2017-07-14 DIAGNOSIS — E1142 Type 2 diabetes mellitus with diabetic polyneuropathy: Secondary | ICD-10-CM

## 2017-07-14 DIAGNOSIS — J069 Acute upper respiratory infection, unspecified: Secondary | ICD-10-CM | POA: Diagnosis present

## 2017-07-14 DIAGNOSIS — I5023 Acute on chronic systolic (congestive) heart failure: Secondary | ICD-10-CM

## 2017-07-14 DIAGNOSIS — I248 Other forms of acute ischemic heart disease: Secondary | ICD-10-CM | POA: Diagnosis present

## 2017-07-14 DIAGNOSIS — F17210 Nicotine dependence, cigarettes, uncomplicated: Secondary | ICD-10-CM | POA: Diagnosis present

## 2017-07-14 DIAGNOSIS — Z79899 Other long term (current) drug therapy: Secondary | ICD-10-CM

## 2017-07-14 DIAGNOSIS — E119 Type 2 diabetes mellitus without complications: Secondary | ICD-10-CM | POA: Diagnosis present

## 2017-07-14 DIAGNOSIS — Z825 Family history of asthma and other chronic lower respiratory diseases: Secondary | ICD-10-CM

## 2017-07-14 DIAGNOSIS — Z7951 Long term (current) use of inhaled steroids: Secondary | ICD-10-CM

## 2017-07-14 DIAGNOSIS — Z9851 Tubal ligation status: Secondary | ICD-10-CM

## 2017-07-14 DIAGNOSIS — Z23 Encounter for immunization: Secondary | ICD-10-CM

## 2017-07-14 DIAGNOSIS — Z823 Family history of stroke: Secondary | ICD-10-CM

## 2017-07-14 DIAGNOSIS — J449 Chronic obstructive pulmonary disease, unspecified: Secondary | ICD-10-CM | POA: Diagnosis present

## 2017-07-14 DIAGNOSIS — R061 Stridor: Secondary | ICD-10-CM | POA: Diagnosis not present

## 2017-07-14 DIAGNOSIS — R079 Chest pain, unspecified: Secondary | ICD-10-CM

## 2017-07-14 DIAGNOSIS — I10 Essential (primary) hypertension: Secondary | ICD-10-CM

## 2017-07-14 DIAGNOSIS — Z9889 Other specified postprocedural states: Secondary | ICD-10-CM

## 2017-07-14 DIAGNOSIS — Z9981 Dependence on supplemental oxygen: Secondary | ICD-10-CM

## 2017-07-14 DIAGNOSIS — Z888 Allergy status to other drugs, medicaments and biological substances status: Secondary | ICD-10-CM

## 2017-07-14 DIAGNOSIS — I110 Hypertensive heart disease with heart failure: Principal | ICD-10-CM | POA: Diagnosis present

## 2017-07-14 DIAGNOSIS — R600 Localized edema: Secondary | ICD-10-CM

## 2017-07-14 DIAGNOSIS — Z833 Family history of diabetes mellitus: Secondary | ICD-10-CM

## 2017-07-14 DIAGNOSIS — I459 Conduction disorder, unspecified: Secondary | ICD-10-CM | POA: Diagnosis present

## 2017-07-14 DIAGNOSIS — Z90710 Acquired absence of both cervix and uterus: Secondary | ICD-10-CM

## 2017-07-14 DIAGNOSIS — I429 Cardiomyopathy, unspecified: Secondary | ICD-10-CM | POA: Diagnosis present

## 2017-07-14 DIAGNOSIS — Z794 Long term (current) use of insulin: Secondary | ICD-10-CM

## 2017-07-14 DIAGNOSIS — Z7982 Long term (current) use of aspirin: Secondary | ICD-10-CM

## 2017-07-14 DIAGNOSIS — Z6839 Body mass index (BMI) 39.0-39.9, adult: Secondary | ICD-10-CM

## 2017-07-14 DIAGNOSIS — Z86010 Personal history of colonic polyps: Secondary | ICD-10-CM

## 2017-07-14 DIAGNOSIS — E876 Hypokalemia: Secondary | ICD-10-CM | POA: Diagnosis present

## 2017-07-14 DIAGNOSIS — Z95810 Presence of automatic (implantable) cardiac defibrillator: Secondary | ICD-10-CM

## 2017-07-14 DIAGNOSIS — Z8249 Family history of ischemic heart disease and other diseases of the circulatory system: Secondary | ICD-10-CM

## 2017-07-14 HISTORY — DX: Shortness of breath: R06.02

## 2017-07-14 LAB — BASIC METABOLIC PANEL
Anion gap: 14 (ref 5–15)
BUN: 10 mg/dL (ref 6–20)
CO2: 28 mmol/L (ref 22–32)
Calcium: 8.7 mg/dL — ABNORMAL LOW (ref 8.9–10.3)
Chloride: 99 mmol/L — ABNORMAL LOW (ref 101–111)
Creatinine, Ser: 1.01 mg/dL — ABNORMAL HIGH (ref 0.44–1.00)
GFR calc Af Amer: >60 mL/min (ref >60)
GFR calc non Af Amer: 58 mL/min — ABNORMAL LOW (ref >60)
Glucose, Bld: 229 mg/dL — ABNORMAL HIGH (ref 65–99)
Potassium: 4 mmol/L (ref 3.5–5.1)
Sodium: 141 mmol/L (ref 135–145)

## 2017-07-14 LAB — CBC
HCT: 44.6 % (ref 36.0–46.0)
Hemoglobin: 14.2 g/dL (ref 12.0–15.0)
MCH: 28 pg (ref 26.0–34.0)
MCHC: 31.8 g/dL (ref 30.0–36.0)
MCV: 88 fL (ref 78.0–100.0)
Platelets: 174 10*3/uL (ref 150–400)
RBC: 5.07 MIL/uL (ref 3.87–5.11)
RDW: 16.3 % — ABNORMAL HIGH (ref 11.5–15.5)
WBC: 9 10*3/uL (ref 4.0–10.5)

## 2017-07-14 LAB — HEPATIC FUNCTION PANEL
ALT: 17 U/L (ref 14–54)
AST: 23 U/L (ref 15–41)
Albumin: 3.8 g/dL (ref 3.5–5.0)
Alkaline Phosphatase: 99 U/L (ref 38–126)
Bilirubin, Direct: 0.4 mg/dL (ref 0.1–0.5)
Indirect Bilirubin: 1.1 mg/dL — ABNORMAL HIGH (ref 0.3–0.9)
Total Bilirubin: 1.5 mg/dL — ABNORMAL HIGH (ref 0.3–1.2)
Total Protein: 7.3 g/dL (ref 6.5–8.1)

## 2017-07-14 LAB — URINALYSIS, ROUTINE W REFLEX MICROSCOPIC
Bilirubin Urine: NEGATIVE
Glucose, UA: 50 mg/dL — AB
Hgb urine dipstick: NEGATIVE
Ketones, ur: 5 mg/dL — AB
Leukocytes, UA: NEGATIVE
Nitrite: NEGATIVE
Protein, ur: 100 mg/dL — AB
Specific Gravity, Urine: 1.029 (ref 1.005–1.030)
pH: 5 (ref 5.0–8.0)

## 2017-07-14 LAB — I-STAT TROPONIN, ED: Troponin i, poc: 0.08 ng/mL (ref 0.00–0.08)

## 2017-07-14 LAB — TROPONIN I: Troponin I: 0.05 ng/mL (ref <0.03)

## 2017-07-14 LAB — MRSA PCR SCREENING: MRSA by PCR: NEGATIVE

## 2017-07-14 LAB — GLUCOSE, CAPILLARY
Glucose-Capillary: 173 mg/dL — ABNORMAL HIGH (ref 65–99)
Glucose-Capillary: 277 mg/dL — ABNORMAL HIGH (ref 65–99)

## 2017-07-14 LAB — LIPASE, BLOOD: Lipase: 27 U/L (ref 11–51)

## 2017-07-14 LAB — BRAIN NATRIURETIC PEPTIDE: B Natriuretic Peptide: 199.5 pg/mL — ABNORMAL HIGH (ref 0.0–100.0)

## 2017-07-14 MED ORDER — INSULIN ASPART 100 UNIT/ML ~~LOC~~ SOLN
0.0000 [IU] | Freq: Three times a day (TID) | SUBCUTANEOUS | Status: DC
  Administered 2017-07-15 (×2): 5 [IU] via SUBCUTANEOUS
  Administered 2017-07-15: 8 [IU] via SUBCUTANEOUS
  Administered 2017-07-16: 5 [IU] via SUBCUTANEOUS
  Administered 2017-07-16: 3 [IU] via SUBCUTANEOUS
  Administered 2017-07-16: 8 [IU] via SUBCUTANEOUS
  Administered 2017-07-17: 3 [IU] via SUBCUTANEOUS
  Administered 2017-07-17: 11 [IU] via SUBCUTANEOUS

## 2017-07-14 MED ORDER — FUROSEMIDE 10 MG/ML IJ SOLN
80.0000 mg | Freq: Two times a day (BID) | INTRAMUSCULAR | Status: DC
  Administered 2017-07-14 – 2017-07-16 (×4): 80 mg via INTRAVENOUS
  Filled 2017-07-14 (×4): qty 8

## 2017-07-14 MED ORDER — MOMETASONE FURO-FORMOTEROL FUM 200-5 MCG/ACT IN AERO
2.0000 | INHALATION_SPRAY | Freq: Two times a day (BID) | RESPIRATORY_TRACT | Status: DC
  Administered 2017-07-14 – 2017-07-17 (×5): 2 via RESPIRATORY_TRACT
  Filled 2017-07-14: qty 8.8

## 2017-07-14 MED ORDER — INSULIN ASPART 100 UNIT/ML ~~LOC~~ SOLN
3.0000 [IU] | Freq: Three times a day (TID) | SUBCUTANEOUS | Status: DC
  Administered 2017-07-15 – 2017-07-17 (×6): 3 [IU] via SUBCUTANEOUS

## 2017-07-14 MED ORDER — INSULIN GLARGINE 100 UNIT/ML ~~LOC~~ SOLN
10.0000 [IU] | Freq: Every day | SUBCUTANEOUS | Status: DC
  Administered 2017-07-14: 10 [IU] via SUBCUTANEOUS
  Filled 2017-07-14: qty 0.1

## 2017-07-14 MED ORDER — ENOXAPARIN SODIUM 40 MG/0.4ML ~~LOC~~ SOLN
40.0000 mg | Freq: Every day | SUBCUTANEOUS | Status: DC
  Administered 2017-07-15 – 2017-07-16 (×2): 40 mg via SUBCUTANEOUS
  Filled 2017-07-14 (×3): qty 0.4

## 2017-07-14 MED ORDER — IPRATROPIUM-ALBUTEROL 0.5-2.5 (3) MG/3ML IN SOLN
3.0000 mL | Freq: Once | RESPIRATORY_TRACT | Status: AC
  Administered 2017-07-14: 3 mL via RESPIRATORY_TRACT
  Filled 2017-07-14: qty 3

## 2017-07-14 MED ORDER — NITROGLYCERIN 0.4 MG SL SUBL
0.4000 mg | SUBLINGUAL_TABLET | SUBLINGUAL | Status: DC | PRN
  Administered 2017-07-14 (×3): 0.4 mg via SUBLINGUAL
  Filled 2017-07-14: qty 1

## 2017-07-14 MED ORDER — PNEUMOCOCCAL VAC POLYVALENT 25 MCG/0.5ML IJ INJ
0.5000 mL | INJECTION | INTRAMUSCULAR | Status: AC
Start: 2017-07-15 — End: 2017-07-15
  Administered 2017-07-15: 0.5 mL via INTRAMUSCULAR
  Filled 2017-07-14: qty 0.5

## 2017-07-14 NOTE — ED Notes (Signed)
Patient reports feeling anxious and complaining of "breathing crazy" after returning from xray.  MD aware.

## 2017-07-14 NOTE — ED Notes (Signed)
Patient states unable to urinate at this time. Suction canister from external urinary catheter empty.  Patient requesting water.

## 2017-07-14 NOTE — Consult Note (Signed)
Cardiology Consultation:   Patient ID: [name]; [id number]; 1951/09/14   Admit date: 07/14/2017 Date of Consult: 07/14/2017  Primary Care Provider: Kathi Ludwig, MD Primary Cardiologist: Dr.Crenshaw Primary Electrophysiologist:  Dr. Lovena Le   Patient Profile:   [name] is a 66 y.o. female with a hx of with history of combined CHF, NICM w/ICD (SJM single chamber ICD, implanted 04/02/14), HTN, HLD, COPD uses O2 at [facility], is an ongoing smoker, obesity, and DM who is being seen today for the evaluation of Chest pain and shortness of breath at the request of Dr. Sherry Ruffing. .  Prior cardiac testing: LHC 6/13: Mild calcification in the LM, o/w normal coronary arteries, EF 45%.  Admitted 5/17 with encephalopathy and CHF; also with acute renal insuff; improved with therapy. TTE 5/17 showed EF 20-25 (decreased from previous), grade 1 diastolic dysfunction, mildly increased pulmonary pressure. Nuclear study 6/17 showed EF 33, anterolateral ischemia and septal scar/ischemia.  Last seen by Dr. Stanford Breed 06/2016.  Recommended cardiac cath given decrease in left ventricular function and high risk stress test. Follow up LHC with angiographically normal coronaries, LVEDP 22 mmhg.  History of Present Illness:   [name]  States that she has intermittent upper sternal chest pressure with shortness of breath for the past 2 weeks. She also has nausea, cough, subjective fever and palpitation. She is unable to differentiate if her chest pain occurs with palpitation or not. Patient was seen in ER 2 days ago for shortness of breath.minimally elevated BNP and mild pulmonary edema on chest x-ray. Discharge home from ER. However, patient continues to have shortness of breath, orthopnea and chest pain leading to ER presentation again. Patient continues to smokes half a pack a day. She has chronic lower extremity edema. Denies illicit drug use. Compliant with medication.  Point of care troponin 0.08 (  troponin I was 0.04 2 days ago). BNP 200.albumin 1.5. Sodium creatinine 0.01. Blood sugar runs over 200. Chest x-ray today showed cardiomegaly without overt CHF.  EKG:  The EKG was personally reviewed and demonstrates:  EKG shows sinus rhythm at rate of 88 bpm, nonspecific conduction delay and T-wave inversion in lateral lead. T-wave inversion was present on EKG of 07/12/2017 and 02/10/2016. Telemetry:  Telemetry was personally reviewed and demonstrates:  Sinus rhythm at rate mostly in 90s with PACs and NSVT  Past Medical History:  Diagnosis Date  . ATN (acute tubular necrosis) ([place]) 07/15/2014  . Automatic implantable cardioverter-defibrillator in situ   . Cardiac defibrillator in situ    Atlas II VR (SJM) implanted by Dr Lovena Le  . CHF (congestive heart failure) ([place])   . Chronic combined systolic and diastolic heart failure (HCC)    a. EF 35-40% in past;  b. Echo 7/13:  EF 45-50%, Gr 2 diast dysfn, mild AI, mild MAC, trivial MR, mild LAE, PASP 47.  [name] Chronic ulcer of leg ([place])    04-09-15 resolved-not a problem.  . Colon polyps 04/12/2013   Rectosigmoid polyp  . COPD (chronic obstructive pulmonary disease) (HCC)    O2 at night  . Depression   . Diabetes mellitus   . Eczema   . Elevated alkaline phosphatase level    GGT and 5'nucleotidase 8/13 normal  . History of oxygen administration    oxygen @ 2 l/m nasally bedtime 24/7  . HTN (hypertension)   . Hx of cardiac cath    a. [place] 2003 normal;  b. LHC 6/13:  Mild calcification in the LM, o/w normal coronary  arteries, EF 45%.   . Hyperlipidemia   . Hyperthyroidism, subclinical   . Implantable cardioverter-defibrillator (ICD) generator end of life   . Lipoma   . NICM (nonischemic cardiomyopathy) ([place])   . Obesity   . On home oxygen therapy    "2L; 24/7" (10/11/2016)  . Post menopausal syndrome   . Sleep apnea    pt denies 04/12/2013    Past Surgical History:  Procedure Laterality Date  . ABDOMINAL HYSTERECTOMY    . CARDIAC  CATHETERIZATION    . CARDIAC CATHETERIZATION N/A 06/21/2016   Procedure: Left Heart Cath and Coronary Angiography;  Surgeon: Sherren Mocha, MD;  Location: [place] [facility];  Service: Cardiovascular;  Laterality: N/A;  . CARDIAC DEFIBRILLATOR PLACEMENT  05/04/2007   SJM Atlas II VR ICD  . CARDIAC DEFIBRILLATOR PLACEMENT    . COLONOSCOPY N/A 04/12/2013   Procedure: COLONOSCOPY;  Surgeon: Beryle Beams, MD;  Location: [facility];  Service: Endoscopy;  Laterality: N/A;  pt.has defibrilator  . COLONOSCOPY N/A 04/16/2015   Procedure: COLONOSCOPY;  Surgeon: [name], MD;  Location: [facility]OSCOPY;  Service: Endoscopy;  Laterality: N/A;  . ESOPHAGOGASTRODUODENOSCOPY N/A 04/16/2015   Procedure: ESOPHAGOGASTRODUODENOSCOPY (EGD);  Surgeon: [name], MD;  Location: [facility];  Service: Endoscopy;  Laterality: N/A;  . HERNIA REPAIR    . HYSTEROSCOPY    . IMPLANTABLE CARDIOVERTER DEFIBRILLATOR (ICD) GENERATOR CHANGE N/A 04/02/2014   Procedure: ICD GENERATOR CHANGE;  Surgeon: Evans Lance, MD;  Location: [facility] CATH LAB;  Service: Cardiovascular;  Laterality: N/A;  . INSERT / REPLACE / REMOVE PACEMAKER    . LEFT HEART CATHETERIZATION WITH CORONARY ANGIOGRAM N/A 04/02/2012   Procedure: LEFT HEART CATHETERIZATION WITH CORONARY ANGIOGRAM;  Surgeon: Hillary Bow, MD;  Location: [facility] CATH LAB;  Service: Cardiovascular;  Laterality: N/A;  . TUBAL LIGATION       Home Medications:  Prior to Admission medications   Medication Sig Start Date End Date Taking? Authorizing Provider  albuterol (PROVENTIL) (2.5 MG/3ML) 0.083% nebulizer solution USE 1 VIAL VIA NEBULIZER EVERY 6 HOURS AS NEEDED FOR WHEEZING OR SHORTNESS OF BREATH 11/22/16  Yes Burns, Alexa R, MD  aspirin EC 81 MG tablet Take 1 tablet (81 mg total) by mouth daily. 11/13/15  Yes Burns, Arloa Koh, MD  betamethasone dipropionate (DIPROLENE) 0.05 % cream Apply topically 2 (two) times daily. Patient taking differently: Apply topically daily as  needed.  05/31/17  Yes Shela Leff, MD  diphenhydrAMINE (BENADRYL) 25 mg capsule Take 1 capsule (25 mg total) by mouth every 6 (six) hours as needed. 05/17/17 05/17/18 Yes Shela Leff, MD  Fluticasone-Salmeterol (ADVAIR DISKUS) 250-50 MCG/DOSE AEPB Inhale 1 puff into the lungs 2 (two) times daily. 05/31/17 05/31/18 Yes Shela Leff, MD  furosemide (LASIX) 20 MG tablet Take 3 tablets (60 mg total) by mouth 2 (two) times daily. 11/22/16  Yes Burns, Alexa R, MD  glipiZIDE (GLUCOTROL) 5 MG tablet Take 1 tablet (5 mg total) by mouth 2 (two) times daily before a meal. 03/02/17  Yes Patel, Rushil V, MD  Insulin Glargine (LANTUS) 100 UNIT/ML Solostar Pen Inject 35 Units into the skin daily. 06/21/17  Yes Katherine Roan, MD  lisinopril (PRINIVIL,ZESTRIL) 20 MG tablet Take 2 tablets (40 mg total) by mouth daily. 06/21/17 10/19/17 Yes Katherine Roan, MD  metFORMIN (GLUCOPHAGE-XR) 500 MG 24 hr tablet Take 4 tablets (2,000 mg total) by mouth daily with breakfast. 06/21/17  Yes Forde Dandy, PharmD  metoprolol succinate (TOPROL-XL) 50 MG 24  hr tablet Take 2 tablets (100 mg total) by mouth daily. Patient taking differently: Take 50 mg by mouth 2 (two) times daily.  03/02/17  Yes Riccardo Dubin, MD  pantoprazole (PROTONIX) 40 MG tablet Take 1 tablet (40 mg total) by mouth daily. 02/08/17  Yes Burns, Arloa Koh, MD  potassium chloride SA (K-DUR,KLOR-CON) 20 MEQ tablet Take 1 tablet (20 mEq total) by mouth 2 (two) times daily. 07/12/17  Yes Virgel Manifold, MD  tiotropium (SPIRIVA) 18 MCG inhalation capsule Place 1 capsule (18 mcg total) into inhaler and inhale daily. 06/21/17  Yes Kathi Ludwig, MD  atorvastatin (LIPITOR) 80 MG tablet Take 1 tablet (80 mg total) by mouth at bedtime. Patient not taking: Reported on 07/12/2017 06/21/17   Kathi Ludwig, MD  feeding supplement, GLUCERNA SHAKE, (GLUCERNA SHAKE) LIQD Take 237 mLs by mouth 3 (three) times daily between meals. Patient not taking:  Reported on 07/05/2017 10/14/16   Florencia Reasons, MD  furosemide (LASIX) 20 MG tablet Take 3 tablets (60 mg total) by mouth 3 (three) times daily. Patient not taking: Reported on 07/14/2017 07/12/17   Virgel Manifold, MD  insulin lispro (HUMALOG KWIKPEN) 100 UNIT/ML KiwkPen Take 7 units with breakfast, 7 units with lunch, and 5 units with dinner. Patient taking differently: Inject 5-7 Units into the skin 3 (three) times daily. Take 7 units with breakfast, 7 units with lunch, and 5 units with dinner. 06/21/17   Katherine Roan, MD  nitroGLYCERIN (NITROSTAT) 0.4 MG SL tablet Place 0.4 mg under the tongue every 5 (five) minutes as needed for chest pain. Reported on 03/18/2016    [provider]    Inpatient Medications: Scheduled Meds:  Continuous Infusions:  PRN Meds:   Allergies:    Allergies  Allergen Reactions  . Actos [Pioglitazone] Other (See Comments)    REACTION: congestive heart failure   . Naproxen Other (See Comments)    570m dose made her sleep for two days  . Rosiglitazone Hives  . Hydrocortisone Other (See Comments)    unknown    Social History:   Social History   Social History  . Marital status: Widowed    Spouse name: MAlroy Dust . Number of children: 3  . Years of education: 165  Occupational History  . disabled    Social History Main Topics  . Smoking status: Current Every Day Smoker    Packs/day: 0.30    Years: 39.00    Types: Cigarettes  . Smokeless tobacco: Never Used     Comment: 1 cig/day.  . Alcohol use No  . Drug use: No  . Sexual activity: Not Currently   Other Topics Concern  . Not on file   Social History Narrative   ** Merged History Encounter **       Married    Family History:    Family History  Problem Relation Age of Onset  . Stroke Mother   . Seizures Father   . Heart disease Father   . Diabetes Sister   . Asthma Maternal Aunt        aunts  . Asthma Maternal Uncle        uncles  . Heart disease Paternal Aunt         aunts  . Heart disease Paternal Uncle        uncles  . Heart disease Maternal Aunt        aunts  . Heart disease Maternal Uncle        uncles  .  Heart disease Maternal Grandfather   . Colon cancer Neg Hx   . Colon polyps Neg Hx   . Esophageal cancer Neg Hx   . Kidney disease Neg Hx   . Gallbladder disease Neg Hx      ROS:  Please see the history of present illness.  ROS  All other ROS reviewed and negative.     Physical Exam/Data:   Vitals:   07/14/17 1215 07/14/17 1230 07/14/17 1315 07/14/17 1345  BP:  (!) 158/86 (!) 153/80 (!) 156/91  Pulse:   93 90  Resp:      Temp:      TempSrc:      SpO2: 100%  100% 100%   No intake or output data in the 24 hours ending 07/14/17 1446 There were no vitals filed for this visit. There is no height or weight on file to calculate BMI.  General:  Well nourished, well developed, in no acute distress HEENT: normal Lymph: no adenopathy Neck: + JVD Endocrine:  No thryomegaly Vascular: No carotid bruits; FA pulses 2+ bilaterally without bruits  Cardiac:  normal S1, S2; RRR; no Systolic murmur Lungs:  Diminished breath sounds with faint rales Abd: soft, nontender, no hepatomegaly  Ext: Trace to 1+ edema Musculoskeletal:  No deformities, BUE and BLE strength normal and equal Skin: warm and dry  Neuro:  CNs 2-12 intact, no focal abnormalities noted Psych:  Normal affect   Relevant CV Studies:  Left Heart Cath and Coronary Angiography 06/2016  Conclusion   1. Angiographically normal coronary arteries 2. Elevated LVEDP (22 mmHg)  Recommendations: medical Rx for CHF   Echo 02/2016 Study Conclusions  - Left ventricle: The cavity size was mildly dilated. Wall   thickness was normal. Systolic function was severely reduced. The   estimated ejection fraction was in the range of 20% to 25%.   Diffuse hypokinesis. Doppler parameters are consistent with   abnormal left ventricular relaxation (grade 1 diastolic   dysfunction). -  Mitral valve: Calcified annulus. - Pulmonary arteries: Systolic pressure was mildly increased.  Impressions:  - Severe global reduction in LV function; grade 1 diastolic   dysfunction; trace MR and TR; mldly elevated pulmonary pressure;   negative saline microcavitation study.  Laboratory Data:  Chemistry Recent Labs Lab 07/12/17 1447 07/14/17 0650  NA 139 141  K 3.3* 4.0  CL 100* 99*  CO2 28 28  GLUCOSE 206* 229*  BUN 8 10  CREATININE 0.84 1.01*  CALCIUM 8.3* 8.7*  GFRNONAA >60 58*  GFRAA >60 >60  ANIONGAP 11 14     Recent Labs Lab 07/14/17 0650  PROT 7.3  ALBUMIN 3.8  AST 23  ALT 17  ALKPHOS 99  BILITOT 1.5*   Hematology Recent Labs Lab 07/12/17 1447 07/14/17 0650  WBC 7.6 9.0  RBC 4.75 5.07  HGB 13.8 14.2  HCT 41.6 44.6  MCV 87.6 88.0  MCH 29.1 28.0  MCHC 33.2 31.8  RDW 16.2* 16.3*  PLT 150 174   Cardiac Enzymes Recent Labs Lab 07/12/17 1447  TROPONINI 0.04*    Recent Labs Lab 07/14/17 0659  TROPIPOC 0.08    BNP Recent Labs Lab 07/12/17 1447 07/14/17 0650  BNP 200.6* 199.5*    DDimer No results for input(s): DDIMER in the last 168 hours.  Radiology/Studies:  Dg Chest 2 View  Result Date: 07/14/2017 CLINICAL DATA:  Chest pain, shortness of Breath EXAM: CHEST  2 VIEW COMPARISON:  07/12/2017 FINDINGS: Left AICD remains in place, unchanged. Cardiomegaly. No  confluent opacities or edema. Small right pleural effusion. IMPRESSION: Cardiomegaly. Small right effusion. No overt failure. Electronically Signed   By: Rolm Baptise M.D.   On: 07/14/2017 07:51   Dg Chest 2 View  Result Date: 07/12/2017 CLINICAL DATA:  Shortness of Breath EXAM: CHEST  2 VIEW COMPARISON:  10/13/2016 FINDINGS: Left AICD remains in place, unchanged. Cardiomegaly with vascular congestion. Mild peribronchial thickening and interstitial prominence could reflect mild interstitial edema, improved since prior study. No effusions or acute bony abnormality. IMPRESSION:  Cardiomegaly with vascular congestion and possible early interstitial edema. Electronically Signed   By: Rolm Baptise M.D.   On: 07/12/2017 13:50    Assessment and Plan:   1. Chest pain - Seems atypical. Point-of-care troponin 0.08. EKG sinus rhythm with chronic T-wave inversion in lateral lead with nonspecific conduction delay. Patient has normal coronaries by cardiac catheterization 06/2016. No further ischemic workup needed currently.  2. Acute on chronic combined CHF - Mild volume overload on exam. BNP minimally elevated to 200. She is compliant with her diuretics. Last echocardiogram 02/2016 showed LVEF of 20-25% with diffuse hypokinesis and grade 1 diastolic dysfunction. Minimally elevated pulmonary pressure. - Recommended internal medicine admission for possible bronchitis and COPD treatment. Likely needs antibiotic. - Start IV Lasix 80 mg twice a day ( she takes 60 mg twice a day at home). Strict I&O. Continue Toprol XL 168m and lisinopril 20 mg.  3. Nonischemic cardiomyopathy s/p ICD - Followed by EP. Recent device check normal 07/05/17.  4. Hypertension - blood pressure minimally elevated. Resume home meds as above. Up titrated as needed.  5. Tobacco abuse - Encouraged cessation.  6. DM - Per primary team   For questions or updates, please contact [place] consult [email or web address] for contact info under Cardiology/STEMI.   SJarrett Soho PA  07/14/2017 2:46 PM

## 2017-07-14 NOTE — ED Notes (Signed)
Placed pt on external cath, informed pt that we need urine.

## 2017-07-14 NOTE — ED Provider Notes (Signed)
[facility] DEPT Provider Note   CSN: [id number] Arrival date & time: 07/14/17  7425     History   Chief Complaint Chief Complaint  Patient presents with  . Chest Pain    hx of CHF    HPI [name] is a 66 y.o. female.  The history is provided by the patient and medical records. No language interpreter was used.  Chest Pain   This is a recurrent problem. The current episode started 2 days ago. The problem occurs constantly. The problem has not changed since onset.The pain is associated with exertion. The pain is present in the substernal region. The pain is moderate. The quality of the pain is described as exertional, heavy and dull. The pain radiates to the right arm and left arm. Duration of episode(s) is 2 days. The symptoms are aggravated by exertion. Associated symptoms include diaphoresis, a fever (subjective), malaise/fatigue, nausea, palpitations, shortness of breath and vomiting. Pertinent negatives include no abdominal pain, no back pain, no cough, no headaches, no hemoptysis, no leg pain, no numbness, no sputum production and no syncope. She has tried rest for the symptoms. The treatment provided mild relief.  Her past medical history is significant for COPD and CHF.    Past Medical History:  Diagnosis Date  . ATN (acute tubular necrosis) ([place]) 07/15/2014  . Automatic implantable cardioverter-defibrillator in situ   . Cardiac defibrillator in situ    Atlas II VR (SJM) implanted by Dr Lovena Le  . CHF (congestive heart failure) ([place])   . Chronic combined systolic and diastolic heart failure (HCC)    a. EF 35-40% in past;  b. Echo 7/13:  EF 45-50%, Gr 2 diast dysfn, mild AI, mild MAC, trivial MR, mild LAE, PASP 47.  [name] Chronic ulcer of leg ([place])    04-09-15 resolved-not a problem.  . Colon polyps 04/12/2013   Rectosigmoid polyp  . COPD (chronic obstructive pulmonary disease) (HCC)    O2 at night  . Depression   . Diabetes mellitus   . Eczema   . Elevated alkaline  phosphatase level    GGT and 5'nucleotidase 8/13 normal  . History of oxygen administration    oxygen @ 2 l/m nasally bedtime 24/7  . HTN (hypertension)   . Hx of cardiac cath    a. [place] 2003 normal;  b. [facility] 6/13:  Mild calcification in the LM, o/w normal coronary arteries, EF 45%.   . Hyperlipidemia   . Hyperthyroidism, subclinical   . Implantable cardioverter-defibrillator (ICD) generator end of life   . Lipoma   . NICM (nonischemic cardiomyopathy) ([place])   . Obesity   . On home oxygen therapy    "2L; 24/7" (10/11/2016)  . Post menopausal syndrome   . Sleep apnea    pt denies 04/12/2013    Patient Active Problem List   Diagnosis Date Noted  . Skin rash 05/03/2017  . Chronic combined systolic and diastolic congestive heart failure ([place]) 03/18/2016  . Nocturnal hypoxia 02/24/2016  . Spinal stenosis of lumbar region 12/09/2015  . COPD (chronic obstructive pulmonary disease) ([place]) 06/24/2015  . Diabetic gastroparesis associated with type 2 diabetes mellitus ([place]) 07/12/2014  . Depression 05/22/2014  . Health care maintenance 01/22/2013  . Nonischemic cardiomyopathy ([place]) 04/03/2012  . Diabetes mellitus, type 2 ([place]) 04/03/2012  . Abdominal pain 04/03/2012  . HYPERTHYROIDISM, SUBCLINICAL 05/06/2009  . Hyperlipidemia associated with type 2 diabetes mellitus ([place]) 05/25/2007  . HYPERTENSION, BENIGN ESSENTIAL 05/25/2007  . Automatic implantable cardioverter-defibrillator in situ 05/04/2007  Past Surgical History:  Procedure Laterality Date  . ABDOMINAL HYSTERECTOMY    . CARDIAC CATHETERIZATION    . CARDIAC CATHETERIZATION N/A 06/21/2016   Procedure: Left Heart Cath and Coronary Angiography;  Surgeon: Sherren Mocha, MD;  Location: [facility];  Service: Cardiovascular;  Laterality: N/A;  . CARDIAC DEFIBRILLATOR PLACEMENT  05/04/2007   SJM Atlas II VR ICD  . CARDIAC DEFIBRILLATOR PLACEMENT    . COLONOSCOPY N/A 04/12/2013   Procedure: COLONOSCOPY;  Surgeon: Beryle Beams,  MD;  Location: [facility];  Service: Endoscopy;  Laterality: N/A;  pt.has defibrilator  . COLONOSCOPY N/A 04/16/2015   Procedure: COLONOSCOPY;  Surgeon: [name], MD;  Location: [facility];  Service: Endoscopy;  Laterality: N/A;  . ESOPHAGOGASTRODUODENOSCOPY N/A 04/16/2015   Procedure: ESOPHAGOGASTRODUODENOSCOPY (EGD);  Surgeon: [name], MD;  Location: [facility];  Service: Endoscopy;  Laterality: N/A;  . HERNIA REPAIR    . HYSTEROSCOPY    . IMPLANTABLE CARDIOVERTER DEFIBRILLATOR (ICD) GENERATOR CHANGE N/A 04/02/2014   Procedure: ICD GENERATOR CHANGE;  Surgeon: Evans Lance, MD;  Location: [facility] CATH LAB;  Service: Cardiovascular;  Laterality: N/A;  . INSERT / REPLACE / REMOVE PACEMAKER    . LEFT HEART CATHETERIZATION WITH CORONARY ANGIOGRAM N/A 04/02/2012   Procedure: LEFT HEART CATHETERIZATION WITH CORONARY ANGIOGRAM;  Surgeon: Hillary Bow, MD;  Location: [facility] CATH LAB;  Service: Cardiovascular;  Laterality: N/A;  . TUBAL LIGATION      OB History    No data available       Home Medications    Prior to Admission medications   Medication Sig Start Date End Date Taking? Authorizing Provider  albuterol (PROVENTIL) (2.5 MG/3ML) 0.083% nebulizer solution USE 1 VIAL VIA NEBULIZER EVERY 6 HOURS AS NEEDED FOR WHEEZING OR SHORTNESS OF BREATH 11/22/16   Burns, Arloa Koh, MD  aspirin EC 81 MG tablet Take 1 tablet (81 mg total) by mouth daily. 11/13/15   Burns, Arloa Koh, MD  atorvastatin (LIPITOR) 80 MG tablet Take 1 tablet (80 mg total) by mouth at bedtime. Patient not taking: Reported on 07/12/2017 06/21/17   Kathi Ludwig, MD  betamethasone dipropionate (DIPROLENE) 0.05 % cream Apply topically 2 (two) times daily. Patient taking differently: Apply topically daily as needed.  05/31/17   Shela Leff, MD  diphenhydrAMINE (BENADRYL) 25 mg capsule Take 1 capsule (25 mg total) by mouth every 6 (six) hours as needed. 05/17/17 05/17/18  Shela Leff, MD  feeding supplement,  GLUCERNA SHAKE, (GLUCERNA SHAKE) LIQD Take 237 mLs by mouth 3 (three) times daily between meals. Patient not taking: Reported on 07/05/2017 10/14/16   Florencia Reasons, MD  Fluticasone-Salmeterol (ADVAIR DISKUS) 250-50 MCG/DOSE AEPB Inhale 1 puff into the lungs 2 (two) times daily. 05/31/17 05/31/18  Shela Leff, MD  furosemide (LASIX) 20 MG tablet Take 3 tablets (60 mg total) by mouth 2 (two) times daily. 11/22/16   Burns, Arloa Koh, MD  furosemide (LASIX) 20 MG tablet Take 3 tablets (60 mg total) by mouth 3 (three) times daily. 07/12/17   Virgel Manifold, MD  glipiZIDE (GLUCOTROL) 5 MG tablet Take 1 tablet (5 mg total) by mouth 2 (two) times daily before a meal. 03/02/17   Riccardo Dubin, MD  Insulin Glargine (LANTUS) 100 UNIT/ML Solostar Pen Inject 35 Units into the skin daily. 06/21/17   Katherine Roan, MD  insulin lispro (HUMALOG KWIKPEN) 100 UNIT/ML KiwkPen Take 7 units with breakfast, 7 units with lunch, and 5 units with dinner. 06/21/17   Winfrey,  Jenne Pane, MD  lisinopril (PRINIVIL,ZESTRIL) 20 MG tablet Take 2 tablets (40 mg total) by mouth daily. 06/21/17 10/19/17  Katherine Roan, MD  metFORMIN (GLUCOPHAGE-XR) 500 MG 24 hr tablet Take 4 tablets (2,000 mg total) by mouth daily with breakfast. Patient not taking: Reported on 07/12/2017 06/21/17   Forde Dandy, PharmD  metoprolol succinate (TOPROL-XL) 50 MG 24 hr tablet Take 2 tablets (100 mg total) by mouth daily. Patient taking differently: Take 50 mg by mouth 2 (two) times daily.  03/02/17   Riccardo Dubin, MD  nitroGLYCERIN (NITROSTAT) 0.4 MG SL tablet Place 0.4 mg under the tongue every 5 (five) minutes as needed for chest pain. Reported on 03/18/2016    [provider]  pantoprazole (PROTONIX) 40 MG tablet Take 1 tablet (40 mg total) by mouth daily. 02/08/17   Burns, Arloa Koh, MD  potassium chloride SA (K-DUR,KLOR-CON) 20 MEQ tablet Take 1 tablet (20 mEq total) by mouth 2 (two) times daily. 07/12/17   Virgel Manifold, MD  tiotropium  (SPIRIVA) 18 MCG inhalation capsule Place 1 capsule (18 mcg total) into inhaler and inhale daily. 06/21/17   Kathi Ludwig, MD    Family History Family History  Problem Relation Age of Onset  . Stroke Mother   . Seizures Father   . Heart disease Father   . Diabetes Sister   . Asthma Maternal Aunt        aunts  . Asthma Maternal Uncle        uncles  . Heart disease Paternal Aunt        aunts  . Heart disease Paternal Uncle        uncles  . Heart disease Maternal Aunt        aunts  . Heart disease Maternal Uncle        uncles  . Heart disease Maternal Grandfather   . Colon cancer Neg Hx   . Colon polyps Neg Hx   . Esophageal cancer Neg Hx   . Kidney disease Neg Hx   . Gallbladder disease Neg Hx     Social History Social History  Substance Use Topics  . Smoking status: Current Every Day Smoker    Packs/day: 0.30    Years: 39.00    Types: Cigarettes  . Smokeless tobacco: Never Used     Comment: 1 cig/day.  . Alcohol use No     Allergies   Actos [pioglitazone]; Naproxen; Rosiglitazone; and Hydrocortisone   Review of Systems Review of Systems  Constitutional: Positive for chills, diaphoresis, fatigue, fever (subjective) and malaise/fatigue.  HENT: Negative for congestion and rhinorrhea.   Eyes: Negative for visual disturbance.  Respiratory: Positive for chest tightness and shortness of breath. Negative for cough, hemoptysis, sputum production, wheezing and stridor.   Cardiovascular: Positive for chest pain and palpitations. Negative for syncope.  Gastrointestinal: Positive for nausea and vomiting. Negative for abdominal pain, constipation and diarrhea.  Genitourinary: Negative for dysuria and flank pain.  Musculoskeletal: Negative for back pain, neck pain and neck stiffness.  Skin: Negative for rash and wound.  Neurological: Negative for light-headedness, numbness and headaches.  Psychiatric/Behavioral: Negative for agitation and confusion.     Physical  Exam Updated Vital Signs Temp 98.6 F (37 C) (Oral)   Physical Exam  Constitutional: She is oriented to person, place, and time. She appears well-developed and well-nourished. No distress.  HENT:  Head: Normocephalic and atraumatic.  Mouth/Throat: Oropharynx is clear and moist. No oropharyngeal exudate.  Eyes: Pupils are equal,  round, and reactive to light. Conjunctivae are normal.  Neck: Neck supple.  Cardiovascular: Normal rate, regular rhythm and intact distal pulses.  Exam reveals no gallop.   Murmur heard. Pulmonary/Chest: Effort normal. No stridor. No respiratory distress. She has no wheezes. She has rales. She exhibits tenderness.  Abdominal: Soft. There is no tenderness.  Musculoskeletal: She exhibits no edema or tenderness.  Neurological: She is alert and oriented to person, place, and time. No sensory deficit. She exhibits normal muscle tone.  Skin: Skin is warm and dry. Capillary refill takes less than 2 seconds. No rash noted. She is not diaphoretic.  Psychiatric: She has a normal mood and affect.  Nursing note and vitals reviewed.    ED Treatments / Results  Labs (all labs ordered are listed, but only abnormal results are displayed) Labs Reviewed  BASIC METABOLIC PANEL - Abnormal; Notable for the following:       Result Value   Chloride 99 (*)    Glucose, Bld 229 (*)    Creatinine, Ser 1.01 (*)    Calcium 8.7 (*)    GFR calc non Af Amer 58 (*)    All other components within normal limits  CBC - Abnormal; Notable for the following:    RDW 16.3 (*)    All other components within normal limits  BRAIN NATRIURETIC PEPTIDE - Abnormal; Notable for the following:    B Natriuretic Peptide 199.5 (*)    All other components within normal limits  HEPATIC FUNCTION PANEL - Abnormal; Notable for the following:    Total Bilirubin 1.5 (*)    Indirect Bilirubin 1.1 (*)    All other components within normal limits  URINE CULTURE  LIPASE, BLOOD  URINALYSIS, ROUTINE W REFLEX  MICROSCOPIC  I-STAT TROPONIN, ED    EKG  EKG Interpretation  Date/Time:  Friday July 14 2017 06:40:18 EDT Ventricular Rate:  88 PR Interval:    QRS Duration: 127 QT Interval:  409 QTC Calculation: 498 R Axis:   123 Text Interpretation:  Sinus rhythm Nonspecific intraventricular conduction delay Nonspecific T abnrm, anterolateral leads When compared to prior, t wave inversions in leads V5-V6. No STEMI Confirmed by Antony Blackbird [phone]) on 07/14/2017 8:14:23 AM       Radiology Dg Chest 2 View  Result Date: 07/14/2017 CLINICAL DATA:  Chest pain, shortness of Breath EXAM: CHEST  2 VIEW COMPARISON:  07/12/2017 FINDINGS: Left AICD remains in place, unchanged. Cardiomegaly. No confluent opacities or edema. Small right pleural effusion. IMPRESSION: Cardiomegaly. Small right effusion. No overt failure. Electronically Signed   By: Rolm Baptise M.D.   On: 07/14/2017 07:51    Procedures Procedures (including critical care time)  Medications Ordered in ED Medications - No data to display   Initial Impression / Assessment and Plan / ED Course  I have reviewed the triage vital signs and the nursing notes.  Pertinent labs & imaging results that were available during my care of the patient were reviewed by me and considered in my medical decision making (see chart for details).     [name] is a 66 y.o. female With past medical history significant for CHF with defibrillator, diabetes, hypertension, and COPD on 2 L nasal cannula oxygen at baseline who presents with chest pain and shortness of breath consistent with her CHF exacerbations. Chart review shows that patient is followed by Dr. Lovena Le with electrophysiology and [facility] with general cardiology. Patient's last echocardiogram was one year ago and revealed a 60-45% EF with systolic  and diastolic dysfunction. Patient reports that she was seen 2 days ago with similar symptoms however she reports they're now worsened. She reports  going home and over the last 2 days has developed a central chest pressure. She describes as 8 out of 10 in severity. It radiates into her arms. She reports diaphoresis, nausea, and 12 episodes of vomiting. She reports the discomfort is exertional and she feels palpitations with them. She denies pleuritic discomfort. She denies constipation, diarrhea, or dysuria. She does report some subjective fevers and chills but did not take her temperature. She reports feeling very tired and is concerned about fluid overload.  On exam, patient had mild crackles in the bases. No rhonchi or appreciated. Chest was nontender. Systolic murmur was heard. Abdomen was nontender. Chest was slightly tender to palpation. Patient's legs were mildly edematous but she reports they're unchanged from prior. No focal neurologic deficits were seen.  Based on symptoms and exam, there is concern for fluid overload. Patient will have x-ray and laboratory testing to look for etiology of her chest pain being cardiac or pulmonary in nature. Patient's chest x-ray showed cardiomegaly with a small right pleural effusion. No overt failure was seen on the x-ray. Patient's EKG did appear to show some new T-wave inversions. Patient's lipase not elevated. Hepatic function showed no significant abnormality aside from slightly elevated total bilirubin. CBC revealed no anemia or leukocytosis. BMP showed elevated glucose but creatinine was grossly reassuring. Potassium normal. Troponin 0.08. This will be trended.  Given patient's description of symptoms and her history, anticipate speaking with cardiology to determine disposition. With the patient's ongoing pain, suspect patient may require admission for echocardiogram and further management of her chest pain.  1:21 PM Patient's BNP is elevated compared to last year but is similar to BNP in January.   On reassessment, patient reports she is still having mild chest discomfort. Given the clinical concern  for fluid overload and EKG showing some T-wave abnormalities, cardiology will be called for management recommendations.  3:19 PM Cardiology came to evaluate the patient and they feel she may have a small CHF exacerbation. They agree with admission to the hospitalist service and they will follow in consultation.  Internal medicine teaching service will be called for admission.   Final Clinical Impressions(s) / ED Diagnoses   Final diagnoses:  Chest pain, unspecified type  Shortness of breath  Bilateral leg edema     Clinical Impression: 1. Chest pain, unspecified type   2. Shortness of breath   3. Bilateral leg edema     Disposition: Admit to Internal medicine    Kmya Placide, Gwenyth Allegra, MD 07/14/17 [phone]

## 2017-07-14 NOTE — H&P (Signed)
Date: 07/14/2017               Patient Name:  [name] MRN: [id number]  DOB: 02/23/1951 Age / Sex: 66 y.o., female   PCP: Kathi Ludwig, MD         Medical Service: Internal Medicine Teaching Service         Attending Physician: Dr. Lenice Pressman    First Contact: Dr. Thomasene Ripple Pager: [phone]  Second Contact: Dr. Kalman Shan Pager: [phone]       After Hours (After 5p/  First Contact Pager: [phone]  weekends / holidays): Second Contact Pager: [phone]   Chief Complaint: SOB and cough  History of Present Illness:  [name] is a 66 yo with a PMH of COPD on 2L home oxygen, combined systolic and diastolic CHF (echo with 56-81% in 02/2016) s/p ICD placement, HTN, HLD, T2DM, and obesity who is presenting with progressively worsening SOB and productive cough. The patient states that her symptoms SOB at rest and cough started earlier in the week and she came to ED evaluation two days ago. At that time she was told that she had increased fluid on her lungs and she was told to increase her dose of lasix from 60 mg BID to 60 mg TID. She was able to take that increased dose when she went home.   She states that since her discharge from the ED two days ago she has felt progressively weak and short of breath. She also has noticed a worsening of the cough which started earlier in the week. This cough is productive. Earlier in the week it consisted of a green sputum but it has become more white/clear liquid in nature. She endorses chest pain as a result of this cough. This pain is described as a pressure which does not radiate but does worsen whenever she coughs. Over the past few days her SOB has significantly worsened and she has been unable to take her normal COPD breathing treatments, as she is too weak to get out of bed. She also reports that her home nebulizer recently broke and she has been unable to take her albuterol treatments while her SOB has worsened. She has felt  so bad, in fact, that she has been unable to smoke any of her cigarettes for the past two days (1/2 PPD for 20-30 years). She also notes a new onset of fever this morning and states that her temperature was taken by EMS but she doesn't remember what it was. Her progressive nature of symptoms and the fact that her SOB had not improved with the interventions prescribed during last ED visit brought her to the ED for reevaluation.  In the route to the hospital she was given albuterol breathing treatments. Upon arrival to the ED she was not hypoxic and no wheezing was noted. Her BNP was elevated to 199.5 and CXR showed possible vascular congestion. A repeat CXR showed a small right sided effusion. Her iSTAT troponin was elevated to 0.08 on admission. Cardiology consult in ED suggested acute on chronic heart failure.   Meds:  Current Meds  Medication Sig  . albuterol (PROVENTIL) (2.5 MG/3ML) 0.083% nebulizer solution USE 1 VIAL VIA NEBULIZER EVERY 6 HOURS AS NEEDED FOR WHEEZING OR SHORTNESS OF BREATH  . aspirin EC 81 MG tablet Take 1 tablet (81 mg total) by mouth daily.  . betamethasone dipropionate (DIPROLENE) 0.05 % cream Apply topically 2 (two) times daily. (Patient taking differently: Apply topically daily  as needed. )  . diphenhydrAMINE (BENADRYL) 25 mg capsule Take 1 capsule (25 mg total) by mouth every 6 (six) hours as needed.  . Fluticasone-Salmeterol (ADVAIR DISKUS) 250-50 MCG/DOSE AEPB Inhale 1 puff into the lungs 2 (two) times daily.  . furosemide (LASIX) 20 MG tablet Take 3 tablets (60 mg total) by mouth 2 (two) times daily.  [name] glipiZIDE (GLUCOTROL) 5 MG tablet Take 1 tablet (5 mg total) by mouth 2 (two) times daily before a meal.  . Insulin Glargine (LANTUS) 100 UNIT/ML Solostar Pen Inject 35 Units into the skin daily.  [name] lisinopril (PRINIVIL,ZESTRIL) 20 MG tablet Take 2 tablets (40 mg total) by mouth daily.  . metFORMIN (GLUCOPHAGE-XR) 500 MG 24 hr tablet Take 4 tablets (2,000 mg total) by  mouth daily with breakfast.  . metoprolol succinate (TOPROL-XL) 50 MG 24 hr tablet Take 2 tablets (100 mg total) by mouth daily. (Patient taking differently: Take 50 mg by mouth 2 (two) times daily. )  . pantoprazole (PROTONIX) 40 MG tablet Take 1 tablet (40 mg total) by mouth daily.  . potassium chloride SA (K-DUR,KLOR-CON) 20 MEQ tablet Take 1 tablet (20 mEq total) by mouth 2 (two) times daily.  [name] tiotropium (SPIRIVA) 18 MCG inhalation capsule Place 1 capsule (18 mcg total) into inhaler and inhale daily.   Allergies: Allergies as of 07/14/2017 - Review Complete 07/14/2017  Allergen Reaction Noted  . Actos [pioglitazone] Other (See Comments) 03/12/2009  . Naproxen Other (See Comments) 02/05/2016  . Rosiglitazone Hives 02/11/2016  . Hydrocortisone Other (See Comments) 10/06/2009   Past Medical History: Past Medical History:  Diagnosis Date  . ATN (acute tubular necrosis) ([place]) 07/15/2014  . Automatic implantable cardioverter-defibrillator in situ   . Cardiac defibrillator in situ    Atlas II VR (SJM) implanted by Dr Lovena Le  . CHF (congestive heart failure) ([place])   . Chronic combined systolic and diastolic heart failure (HCC)    a. EF 35-40% in past;  b. Echo 7/13:  EF 45-50%, Gr 2 diast dysfn, mild AI, mild MAC, trivial MR, mild LAE, PASP 47.  [name] Chronic ulcer of leg ([place])    04-09-15 resolved-not a problem.  . Colon polyps 04/12/2013   Rectosigmoid polyp  . COPD (chronic obstructive pulmonary disease) (HCC)    O2 at night  . Depression   . Diabetes mellitus   . Eczema   . Elevated alkaline phosphatase level    GGT and 5'nucleotidase 8/13 normal  . History of oxygen administration    oxygen @ 2 l/m nasally bedtime 24/7  . HTN (hypertension)   . Hx of cardiac cath    a. [place] 2003 normal;  b. [facility] 6/13:  Mild calcification in the LM, o/w normal coronary arteries, EF 45%.   . Hyperlipidemia   . Hyperthyroidism, subclinical   . Implantable cardioverter-defibrillator (ICD) generator  end of life   . Lipoma   . NICM (nonischemic cardiomyopathy) ([place])   . Obesity   . On home oxygen therapy    "2L; 24/7" (10/11/2016)  . Post menopausal syndrome   . Sleep apnea    pt denies 04/12/2013   Surgical History Past Surgical History:  Procedure Laterality Date  . ABDOMINAL HYSTERECTOMY    . CARDIAC CATHETERIZATION    . CARDIAC CATHETERIZATION N/A 06/21/2016   Procedure: Left Heart Cath and Coronary Angiography;  Surgeon: Sherren Mocha, MD;  Location: [facility];  Service: Cardiovascular;  Laterality: N/A;  . CARDIAC DEFIBRILLATOR PLACEMENT  05/04/2007   SJM  Atlas II VR ICD  . CARDIAC DEFIBRILLATOR PLACEMENT    . COLONOSCOPY N/A 04/12/2013   Procedure: COLONOSCOPY;  Surgeon: Beryle Beams, MD;  Location: [facility];  Service: Endoscopy;  Laterality: N/A;  pt.has defibrilator  . COLONOSCOPY N/A 04/16/2015   Procedure: COLONOSCOPY;  Surgeon: [name], MD;  Location: [facility];  Service: Endoscopy;  Laterality: N/A;  . ESOPHAGOGASTRODUODENOSCOPY N/A 04/16/2015   Procedure: ESOPHAGOGASTRODUODENOSCOPY (EGD);  Surgeon: [name], MD;  Location: [facility];  Service: Endoscopy;  Laterality: N/A;  . HERNIA REPAIR    . HYSTEROSCOPY    . IMPLANTABLE CARDIOVERTER DEFIBRILLATOR (ICD) GENERATOR CHANGE N/A 04/02/2014   Procedure: ICD GENERATOR CHANGE;  Surgeon: Evans Lance, MD;  Location: [facility] LAB;  Service: Cardiovascular;  Laterality: N/A;  . INSERT / REPLACE / REMOVE PACEMAKER    . LEFT HEART CATHETERIZATION WITH CORONARY ANGIOGRAM N/A 04/02/2012   Procedure: LEFT HEART CATHETERIZATION WITH CORONARY ANGIOGRAM;  Surgeon: Hillary Bow, MD;  Location: [facility] CATH LAB;  Service: Cardiovascular;  Laterality: N/A;  . TUBAL LIGATION     Family History:  Family History  Problem Relation Age of Onset  . Stroke Mother   . Seizures Father   . Heart disease Father   . Diabetes Sister   . Asthma Maternal Aunt        aunts  . Asthma Maternal Uncle        uncles    . Heart disease Paternal Aunt        aunts  . Heart disease Paternal Uncle        uncles  . Heart disease Maternal Aunt        aunts  . Heart disease Maternal Uncle        uncles  . Heart disease Maternal Grandfather   . Colon cancer Neg Hx   . Colon polyps Neg Hx   . Esophageal cancer Neg Hx   . Kidney disease Neg Hx   . Gallbladder disease Neg Hx    Social History:  Patient smokes 1/2 PPD and has done so for many years. Patient reports drinking alcohol on weekends with friends (maybe 1-2 beers) but has not done so recently. Denies IV drug use.   Review of Systems: A complete ROS was negative except as per HPI.   Physical Exam: Blood pressure 131/74, pulse 97, temperature 98.6 F (37 C), temperature source Oral, resp. rate (!) 21, SpO2 100 %.  Physical Exam  Constitutional:  Obese woman sitting in bed with [place] in place in no acute distress.  HENT:  Mouth/Throat: Oropharynx is clear and moist.  Neck: No JVD present.  Cardiovascular: Normal rate and regular rhythm.  Exam reveals no friction rub.   No murmur heard. Pulmonary/Chest:  Tachypnea during examination without accessory muscle use or nasal flaring. No wheezing throughout lung fields, but crackles appreciated at bilateral lung bases.  Abdominal: Soft. She exhibits no distension. There is no tenderness. There is no guarding.  Musculoskeletal: She exhibits edema (Trace pitting edema at ankles bilateraly). She exhibits no tenderness.  Skin: Skin is warm and dry. No rash noted. No erythema.   EKG: personally reviewed my interpretation is sinus rhythm with possible LA enlargement.  CXR: personally reviewed my interpretation is cardiomegaly with stable ICD placement. Minimal signs of pulmonary congestion and possible right sided pleural effusion.   Assessment & Plan by Problem: Principal Problem:   Shortness of breath  Preet Mangano is a 67 yo with a PMH of  COPD on 2L home oxygen, combined systolic and diastolic CHF (echo  with 35-32% in 02/2016) s/p ICD placement, HTN, HLD, T2DM, and obesity who is presenting with progressively worsening SOB and productive cough. In the ED the patient was found to have an elevated troponin without EKG suggestive of ischemia. Cardiology was consulted and they suggested acute on chronic heart failure with demand ischemia. She was admitted to the internal medicine teaching service for management. The problems addressed during admission are as follows:  SOB concerning for acute on chronic HF: Patient has history of combined systolic and diastolic CHF with EF 99-24%. She appears mildly volume overloaded on exam, with trace pitting edema and crackles at lung bases. Her labs, including slightly elevated BNP, and CXR suggest volume overload. Patient's symptoms seem most consistent with acute CHF. Since patient has failed outpatient management by increasing oral lasix 60 mg BID to 60 mg TID, will initiate aggressive IV diuresis. Will also obtain new echocardiogram to ensure CHF is not worsening since last EF recorded was in 2017. -IV lasix 80 mg BID -Echocardiogram to assess for worsening HF -Daily BMP while on lasix  Elevated troponin: Patient's iSTAT troponin elevated to 0.08 on admission. The patient's elevated troponin likely 2/2 demand ischemia in the setting of acute CHF exacerbation. This is less likely to be ACS, as the patient does not have EKG suggesting ischemia and had a negative heart catheterization in 2017. The patient endorses non-radiating chest pressure which is reproducible on exam and worse with cough, suggesting that her chest pain is MSK in nature and possibly a result of her continuing productive cough -Trend troponin levels overnight -Will call cardiology if troponin levels significantly elevate   URI: Patient history of COPD, productive cough, wheezing reported by EMS, and fevers were initially concerning for COPD exacerbation. The patient was wheezing upon EMS arrival,  however the patient had been unable to take normal medications at home 2/2 overall fatigue and nebulizer machine dysfunction. The patient was not hypoxic and has been afebrile during admission. Her history and physical exam are more consistent with viral URI rather than COPD exacerbation, therefore will not treat with steroids or antibiotics at this time. Will, however, continue supportive care and home COPD medications. -Albuterol nebulizer q6 hours PRN -DuoNebs q6 hours BRN -Dulera 2 puffs BID   T2DM:  -Holding home glipizide, metformin while inpatient -Lanuts 10 units at bed time, SSI with meals  Hypertension: -Home Lisinopril 40 mg and metoprolol 100 mg   Fluids: None Diet: Heart Healthy DVT: Lovenox Code: Full   Dispo: Admit patient to Observation with expected length of stay less than 2 midnights.  SignedThomasene Ripple, MD 07/14/2017, 7:45 PM  Pager: [phone]

## 2017-07-14 NOTE — ED Triage Notes (Signed)
Patient from home for evaluation of chest pain and shortness of breath with hx of CHF.  Here 2 days ago for similar.  A&Ox4.

## 2017-07-15 ENCOUNTER — Observation Stay (HOSPITAL_BASED_OUTPATIENT_CLINIC_OR_DEPARTMENT_OTHER): Payer: Medicare Other

## 2017-07-15 DIAGNOSIS — Z9981 Dependence on supplemental oxygen: Secondary | ICD-10-CM

## 2017-07-15 DIAGNOSIS — I110 Hypertensive heart disease with heart failure: Principal | ICD-10-CM

## 2017-07-15 DIAGNOSIS — I351 Nonrheumatic aortic (valve) insufficiency: Secondary | ICD-10-CM

## 2017-07-15 DIAGNOSIS — I5043 Acute on chronic combined systolic (congestive) and diastolic (congestive) heart failure: Secondary | ICD-10-CM

## 2017-07-15 DIAGNOSIS — F17210 Nicotine dependence, cigarettes, uncomplicated: Secondary | ICD-10-CM | POA: Diagnosis present

## 2017-07-15 DIAGNOSIS — J069 Acute upper respiratory infection, unspecified: Secondary | ICD-10-CM | POA: Diagnosis not present

## 2017-07-15 DIAGNOSIS — Z7984 Long term (current) use of oral hypoglycemic drugs: Secondary | ICD-10-CM | POA: Diagnosis not present

## 2017-07-15 DIAGNOSIS — R079 Chest pain, unspecified: Secondary | ICD-10-CM | POA: Diagnosis not present

## 2017-07-15 DIAGNOSIS — I214 Non-ST elevation (NSTEMI) myocardial infarction: Secondary | ICD-10-CM

## 2017-07-15 DIAGNOSIS — R0602 Shortness of breath: Secondary | ICD-10-CM | POA: Diagnosis not present

## 2017-07-15 DIAGNOSIS — R600 Localized edema: Secondary | ICD-10-CM | POA: Diagnosis not present

## 2017-07-15 DIAGNOSIS — E876 Hypokalemia: Secondary | ICD-10-CM | POA: Diagnosis not present

## 2017-07-15 DIAGNOSIS — E119 Type 2 diabetes mellitus without complications: Secondary | ICD-10-CM | POA: Diagnosis not present

## 2017-07-15 DIAGNOSIS — E785 Hyperlipidemia, unspecified: Secondary | ICD-10-CM | POA: Diagnosis not present

## 2017-07-15 DIAGNOSIS — Z9889 Other specified postprocedural states: Secondary | ICD-10-CM | POA: Diagnosis not present

## 2017-07-15 DIAGNOSIS — Z7982 Long term (current) use of aspirin: Secondary | ICD-10-CM | POA: Diagnosis not present

## 2017-07-15 DIAGNOSIS — Z833 Family history of diabetes mellitus: Secondary | ICD-10-CM | POA: Diagnosis not present

## 2017-07-15 DIAGNOSIS — E669 Obesity, unspecified: Secondary | ICD-10-CM

## 2017-07-15 DIAGNOSIS — J449 Chronic obstructive pulmonary disease, unspecified: Secondary | ICD-10-CM | POA: Diagnosis not present

## 2017-07-15 DIAGNOSIS — Z86010 Personal history of colonic polyps: Secondary | ICD-10-CM | POA: Diagnosis not present

## 2017-07-15 DIAGNOSIS — N179 Acute kidney failure, unspecified: Secondary | ICD-10-CM | POA: Diagnosis not present

## 2017-07-15 DIAGNOSIS — Z825 Family history of asthma and other chronic lower respiratory diseases: Secondary | ICD-10-CM | POA: Diagnosis not present

## 2017-07-15 DIAGNOSIS — Z823 Family history of stroke: Secondary | ICD-10-CM | POA: Diagnosis not present

## 2017-07-15 DIAGNOSIS — Z888 Allergy status to other drugs, medicaments and biological substances status: Secondary | ICD-10-CM | POA: Diagnosis not present

## 2017-07-15 DIAGNOSIS — Z9851 Tubal ligation status: Secondary | ICD-10-CM | POA: Diagnosis not present

## 2017-07-15 DIAGNOSIS — I248 Other forms of acute ischemic heart disease: Secondary | ICD-10-CM | POA: Diagnosis not present

## 2017-07-15 DIAGNOSIS — Z8249 Family history of ischemic heart disease and other diseases of the circulatory system: Secondary | ICD-10-CM | POA: Diagnosis not present

## 2017-07-15 DIAGNOSIS — Z7951 Long term (current) use of inhaled steroids: Secondary | ICD-10-CM | POA: Diagnosis not present

## 2017-07-15 DIAGNOSIS — I5023 Acute on chronic systolic (congestive) heart failure: Secondary | ICD-10-CM | POA: Diagnosis not present

## 2017-07-15 DIAGNOSIS — Z79899 Other long term (current) drug therapy: Secondary | ICD-10-CM

## 2017-07-15 DIAGNOSIS — Z90710 Acquired absence of both cervix and uterus: Secondary | ICD-10-CM | POA: Diagnosis not present

## 2017-07-15 DIAGNOSIS — Z95810 Presence of automatic (implantable) cardiac defibrillator: Secondary | ICD-10-CM | POA: Diagnosis not present

## 2017-07-15 DIAGNOSIS — Z23 Encounter for immunization: Secondary | ICD-10-CM | POA: Diagnosis not present

## 2017-07-15 LAB — BASIC METABOLIC PANEL
Anion gap: 15 (ref 5–15)
BUN: 15 mg/dL (ref 6–20)
CO2: 28 mmol/L (ref 22–32)
Calcium: 8.7 mg/dL — ABNORMAL LOW (ref 8.9–10.3)
Chloride: 95 mmol/L — ABNORMAL LOW (ref 101–111)
Creatinine, Ser: 0.97 mg/dL (ref 0.44–1.00)
GFR calc Af Amer: >60 mL/min (ref >60)
GFR calc non Af Amer: >60 mL/min (ref >60)
Glucose, Bld: 288 mg/dL — ABNORMAL HIGH (ref 65–99)
Potassium: 3.5 mmol/L (ref 3.5–5.1)
Sodium: 138 mmol/L (ref 135–145)

## 2017-07-15 LAB — GLUCOSE, CAPILLARY
Glucose-Capillary: 238 mg/dL — ABNORMAL HIGH (ref 65–99)
Glucose-Capillary: 254 mg/dL — ABNORMAL HIGH (ref 65–99)
Glucose-Capillary: 298 mg/dL — ABNORMAL HIGH (ref 65–99)
Glucose-Capillary: 188 mg/dL — ABNORMAL HIGH (ref 65–99)

## 2017-07-15 LAB — CBC
HCT: 45.3 % (ref 36.0–46.0)
Hemoglobin: 14.4 g/dL (ref 12.0–15.0)
MCH: 28.2 pg (ref 26.0–34.0)
MCHC: 31.8 g/dL (ref 30.0–36.0)
MCV: 88.6 fL (ref 78.0–100.0)
Platelets: 177 10*3/uL (ref 150–400)
RBC: 5.11 MIL/uL (ref 3.87–5.11)
RDW: 15.9 % — ABNORMAL HIGH (ref 11.5–15.5)
WBC: 9.1 10*3/uL (ref 4.0–10.5)

## 2017-07-15 LAB — TROPONIN I: Troponin I: 0.05 ng/mL (ref <0.03)

## 2017-07-15 LAB — ECHOCARDIOGRAM COMPLETE
Height: 61 in
Weight: 3360 oz

## 2017-07-15 LAB — URINE CULTURE

## 2017-07-15 MED ORDER — ORAL CARE MOUTH RINSE
15.0000 mL | Freq: Two times a day (BID) | OROMUCOSAL | Status: DC
  Administered 2017-07-15 – 2017-07-17 (×3): 15 mL via OROMUCOSAL

## 2017-07-15 MED ORDER — INSULIN GLARGINE 100 UNIT/ML ~~LOC~~ SOLN
20.0000 [IU] | Freq: Every day | SUBCUTANEOUS | Status: DC
  Administered 2017-07-15 – 2017-07-16 (×2): 20 [IU] via SUBCUTANEOUS
  Filled 2017-07-15 (×2): qty 0.2

## 2017-07-15 MED ORDER — GLUCERNA SHAKE PO LIQD
237.0000 mL | Freq: Two times a day (BID) | ORAL | Status: DC
  Administered 2017-07-15 – 2017-07-17 (×3): 237 mL via ORAL

## 2017-07-15 MED ORDER — LISINOPRIL 40 MG PO TABS
40.0000 mg | ORAL_TABLET | Freq: Every day | ORAL | Status: DC
  Administered 2017-07-15 – 2017-07-16 (×2): 40 mg via ORAL
  Filled 2017-07-15: qty 2
  Filled 2017-07-15: qty 1

## 2017-07-15 NOTE — Progress Notes (Signed)
  Echocardiogram 2D Echocardiogram has been performed.  [name] 07/15/2017, 1:43 PM

## 2017-07-15 NOTE — Progress Notes (Signed)
Transferred patient to 9E17. Hand off report given to Ander Purpura, RN.  Patient left with belongings: clothing in bag and was transported by wheelchair on 3L O2 via  [place] without complications.

## 2017-07-15 NOTE — Progress Notes (Signed)
   Subjective:  Patient was seen laying comfortably in bed this AM in no acute distress. A large volume of whitish-clear sputum was observed in the wall suction canister at bedside. The patient states that she states she feels much better this AM compared to yesterday and that her breathing greatly improved overnight.   Objective:  Vital signs in last 24 hours: Vitals:   07/15/17 0300 07/15/17 0749 07/15/17 0935 07/15/17 1101  BP: (!) 133/110  (!) 161/75 (!) 144/93  Pulse: 100   92  Resp: (!) 23   19  Temp: 98 F (36.7 C)   98.8 F (37.1 C)  TempSrc: Oral   Oral  SpO2: 99% 98%  100%  Weight:      Height:       Physical Exam  Constitutional:  Obese woman laying in bed in no acute distress with [place] in place.  HENT:  Mouth/Throat: Oropharynx is clear and moist.  Neck: No JVD present.  Cardiovascular: Normal rate, regular rhythm and intact distal pulses.  Exam reveals no friction rub.   No murmur heard. Pulmonary/Chest: Effort normal. No respiratory distress. She has no wheezes.  No crackles appreciated  Abdominal: Soft. She exhibits no distension. There is no tenderness. There is no guarding.  Musculoskeletal: She exhibits edema (trace edema at ankles bilaterally). She exhibits no tenderness.  Skin: Skin is warm and dry. Capillary refill takes less than 2 seconds. No rash noted. No erythema.   Assessment/Plan:  Principal Problem:   Acute on chronic systolic (congestive) heart failure (HCC)  [name] is a 66 yo with a PMH of COPD on 2L home oxygen, combined systolic and diastolic CHF (echo with 03-47% in 02/2016) s/p ICD placement, HTN, HLD, T2DM, and obesity who is presenting with progressively worsening SOB and productive cough. Cardiology consultation in the ED suggested acute on chronic heart failure. She was admitted to the internal medicine teaching service for management. The problems addressed during admission are as follows:  SOB concerning for acute on chronic HF:  Patient has history of combined systolic and diastolic CHF with EF 42-59%. She still appears to be mildly volume overloaded on exam, however there is interval improvement in crackles at lung bases from previous exams. Will continue IV diuresis for one more day and plan to transition to oral tomorrow. Will plan to evaluate for worsening of CHF with echocardiogram, which is ordered and currently pending.  -IV lasix 80 mg BID -Follow up echocardiogram  -Daily BMP while on lasix -Hold home metoprolol 100 mg during acute CHF exacerbation  Elevated troponin 2/2 demand ischemia: Troponin levels plateaued overnight without any complaints of chest pain overnight. Will continue to monitor.  -Continue telemetry while inpatient   URI: Patient history of COPD, productive cough, wheezing reported by EMS, and fevers were initially concerning for COPD exacerbation.The patient's breathing continues to improve with IV lasix and without treatment for COPD exacerbation. At this moment her symptoms of productive cough with green sputum a few days prior to admission are more consistent with URI than COPD exacerbation. Will provide home medications/PRN medications while inpatient. Will continue to monitor.  -Albuterol nebulizer q6 hours PRN -DuoNebs q6 hours BRN -Dulera 2 puffs BID   T2DM:  -Holding home glipizide, metformin while inpatient -Increase to lantus 20 units qNightly, with SSI with meals  Hypertension: -Home Lisinopril 40 mg  Dispo: Anticipated discharge in approximately 1-2 day(s).   Thomasene Ripple, MD 07/15/2017, 1:22 PM Pager: [phone]

## 2017-07-15 NOTE — Discharge Summary (Signed)
Name: [name] MRN: [id number] DOB: 1951/03/18 66 y.o. PCP: Kathi Ludwig, MD  Date of Admission: 07/14/2017  6:39 AM Date of Discharge: 07/17/2017 Attending Physician: Oda Kilts, MD  Discharge Diagnosis:  Principal Problem:   Acute on chronic systolic (congestive) heart failure [facility]) Active Problems:   Shortness of breath  Discharge Medications: Allergies as of 07/17/2017      Reactions   Actos [pioglitazone] Other (See Comments)   REACTION: congestive heart failure   Naproxen Other (See Comments)   500mg  dose made her sleep for two days   Rosiglitazone Hives   Hydrocortisone Other (See Comments)   unknown      Medication List    TAKE these medications   albuterol (2.5 MG/3ML) 0.083% nebulizer solution Commonly known as:  PROVENTIL USE 1 VIAL VIA NEBULIZER EVERY 6 HOURS AS NEEDED FOR WHEEZING OR SHORTNESS OF BREATH   aspirin EC 81 MG tablet Take 1 tablet (81 mg total) by mouth daily.   atorvastatin 80 MG tablet Commonly known as:  LIPITOR Take 1 tablet (80 mg total) by mouth at bedtime.   betamethasone dipropionate 0.05 % cream Commonly known as:  DIPROLENE Apply topically 2 (two) times daily. What changed:  when to take this  reasons to take this   diphenhydrAMINE 25 mg capsule Commonly known as:  BENADRYL Take 1 capsule (25 mg total) by mouth every 6 (six) hours as needed.   Fluticasone-Salmeterol 250-50 MCG/DOSE Aepb Commonly known as:  ADVAIR DISKUS Inhale 1 puff into the lungs 2 (two) times daily.   furosemide 20 MG tablet Commonly known as:  LASIX Take 4 tablets (80 mg total) by mouth 2 (two) times daily. What changed:  how much to take   glipiZIDE 5 MG tablet Commonly known as:  GLUCOTROL Take 1 tablet (5 mg total) by mouth 2 (two) times daily before a meal.   Insulin Glargine 100 UNIT/ML Solostar Pen Commonly known as:  LANTUS Inject 35 Units into the skin daily.   insulin lispro 100 UNIT/ML KiwkPen Commonly  known as:  HUMALOG KWIKPEN Take 7 units with breakfast, 7 units with lunch, and 5 units with dinner. What changed:  how much to take  how to take this  when to take this  additional instructions   lisinopril 20 MG tablet Commonly known as:  PRINIVIL,ZESTRIL Take 2 tablets (40 mg total) by mouth daily.   metFORMIN 500 MG 24 hr tablet Commonly known as:  GLUCOPHAGE-XR Take 4 tablets (2,000 mg total) by mouth daily with breakfast.   metoprolol succinate 50 MG 24 hr tablet Commonly known as:  TOPROL-XL Take 2 tablets (100 mg total) by mouth daily. What changed:  how much to take  when to take this   nitroGLYCERIN 0.4 MG SL tablet Commonly known as:  NITROSTAT Place 0.4 mg under the tongue every 5 (five) minutes as needed for chest pain. Reported on 03/18/2016   pantoprazole 40 MG tablet Commonly known as:  PROTONIX Take 1 tablet (40 mg total) by mouth daily.   potassium chloride SA 20 MEQ tablet Commonly known as:  K-DUR,KLOR-CON Take 2 tablets (40 mEq total) by mouth 2 (two) times daily. What changed:  how much to take   tiotropium 18 MCG inhalation capsule Commonly known as:  SPIRIVA Place 1 capsule (18 mcg total) into inhaler and inhale daily.       Disposition and follow-up:   Ms.Katana ELENE DOWNUM was discharged from [facility] in Good condition.  At the hospital follow up visit please address:  1.  Patient admitted with worsening SOB and productive cough with white sputum consistent with acute on chronic heart failure. She was given IV lasix for two days with resolution of her symptoms. On day of discharge patient able to ambulate around room without shortness of breath. Please assess symptoms of SOB. Please ask how patient has been doing on oral lasix 80 mg BID, which was increased from previous dose of 60 mg BID prior to hospitalization. Her K supplementation was also increased from 20 mEq BID to 40 mEq BID as a result of increased lasix. Please  assess whether she needs this supplementation or if it can be discontinued.   2.  Labs / imaging needed at time of follow-up: BMP to assess for kidney function as patient developed AKI while in hospital that was resolving on discharge (baseline Cr 0.8-1.0) and to monitor K levels as potassium supplementation was increased with lasix dose on discharge.   3.  Pending labs/ test needing follow-up: None  Follow-up Appointments: Follow-up Information    Kathi Ludwig, MD. Go to.   Specialty:  Internal Medicine Why:  Please attend scheduled hospital follow up appointment on Friday July 21, 2017 at 9:45 am.  Contact information: [address] [place] [address] [place] by problem list: Principal Problem:   Acute on chronic systolic (congestive) heart failure ([place]) Active Problems:   Shortness of breath   Laurann [name] is a 66 yo with a PMH of COPD on 2L home oxygen, combined systolic and diastolic CHF (echo with 82-95% in 02/2016) s/p ICD placement, HTN, HLD, T2DM, and obesity who is presenting with progressively worsening SOB and productive cough. There was initial concern for COPD exacerbation, given her history of worsening cough, however her physical exam and overall clinical picture was more consistent with acute CHF. She was admitted to the internal medicine teaching service with cardiology consulting for management. The specific problems addressed during admission are as follows:  Acute on chronic combined systolic and diastolic HF:  As stated above her PMH of COPD and worsening productive cough, was intially thought to be a cause of COPD exacerbation. Her cough, however, produced a white frothy sputum rather than a green sputum consistent with infection. The patient has history of combined systolic and diastolic CHF with EF 62-13% on 2017 echocardiography and she appeared mildly volume overloaded on initial physical exam, with trace pitting edema  and crackles at lung bases, suggesting acute CHF. A slightly elevated BNP was also consistent with this diagnosis. On admission, the patient's troponin was observed to be slightly elevated at 0.08, but then down trended overnight. This elevation in troponin was most likely 2/2 demand ischemia in the setting of acute CHF as there were no EKG changes consistent with ischemia and the patient denied chest pain during admission. Throughout hospitalization the patient's SOB improved with IV lasix 80 mg BID on hospital day 1, further supporting acute CHF rather than COPD exacerbation. The patient underwent echocardiography on 07/15/2017 which showed LVEF of 25-35% with grade 1 diastolic dysfunction, similar to prior echocardiography and reassuring no worsening of baseline HF. The patient's symptoms of SOB greatly improved during hospitalization and on the day of discharge the patient was stable on her home regimen of oral lasix 80 mg BID, which is increased from prior home regimen of 60 mg BID. We also increased K supplementation to 40  meq BID rather than 20 meq BID as a result of this increase in lasix. Please see how the patient is doing on this medication regimen.   AKI: On second day of IV diuresis, the patient developed an AKI with her creatinine increasing to 1.54 from 0.97. The patient's Cr started down trending to 1.37 with transition to oral diuretics and holding of home lisinopril. Patient was told to follow up with PCP for further monitoring of kidney function after discharge from hospital.   Viral URI: Two days prior to the current hospitalization the patient presented the ED for evaluation of her SOB and cough. At that time the patient's symptoms included malaise, cough with green sputum, and SOB. Her symptoms at that time were likely 2/2 viral URI. It is thought that this viral URI precipitated the patient's acute on chronic HF as the patient reported difficulty eating, drinking, and taking usual  medications after her discharge from the ED. The patient showed no signs or symptoms suggestive of infection during hospitalization, as her WBC was within normal limits on admission and the patient remained afebrile. Supportive care, including administration of home inhaled medications, was provided during her stay to help with her upper respiratory symptoms.  Discharge Vitals:   BP 122/63 (BP Location: Left Arm)   Pulse 84   Temp (!) 97.5 F (36.4 C) (Oral)   Resp 20   Ht 5\' 4"  (1.626 m)   Wt 213 lb (96.6 kg) Comment: scale c  SpO2 100%   BMI 36.56 kg/m   Pertinent Labs, Studies, and Procedures:   BMP BMP Latest Ref Rng & Units 07/17/2017 07/16/2017 07/15/2017  Glucose 65 - 99 mg/dL 207(H) 178(H) 288(H)  BUN 6 - 20 mg/dL 24(H) 17 15  Creatinine 0.44 - 1.00 mg/dL 1.37(H) 1.54(H) 0.97  BUN/Creat Ratio 12 - 28 - - -  Sodium 135 - 145 mmol/L 136 138 138  Potassium 3.5 - 5.1 mmol/L 3.9 3.2(L) 3.5  Chloride 101 - 111 mmol/L 92(L) 92(L) 95(L)  CO2 22 - 32 mmol/L 33(H) 32 28  Calcium 8.9 - 10.3 mg/dL 8.6(L) 8.5(L) 8.7(L)   CBC CBC Latest Ref Rng & Units 07/15/2017 07/14/2017 07/12/2017  WBC 4.0 - 10.5 K/uL 9.1 9.0 7.6  Hemoglobin 12.0 - 15.0 g/dL 14.4 14.2 13.8  Hematocrit 36.0 - 46.0 % 45.3 44.6 41.6  Platelets 150 - 400 K/uL 177 174 150   BNP    Component Value Date/Time   BNP 199.5 (H) 07/14/2017 0650    Chest 2 view: FINDINGS: Left AICD remains in place, unchanged. Cardiomegaly. No confluent opacities or edema. Small right pleural effusion.  IMPRESSION: Cardiomegaly. Small right effusion. No overt failure.  Echocardiography: Study Conclusions - Left ventricle: The cavity size was mildly dilated. There was   mild concentric hypertrophy. Systolic function was severely   reduced. The estimated ejection fraction was in the range of 25%   to 30%. Moderate diffuse hypokinesis. There was an increased   relative contribution of atrial contraction to ventricular    filling. Doppler parameters are consistent with abnormal left   ventricular relaxation (grade 1 diastolic dysfunction). Doppler   parameters are consistent with high ventricular filling pressure. - Aortic valve: There was mild regurgitation. - Mitral valve: Calcified annulus. There was mild regurgitation. - Pulmonary arteries: Systolic pressure could not be accurately   estimated.  Discharge Instructions: Discharge Instructions    (HEART FAILURE PATIENTS) Call MD:  Anytime you have any of the following symptoms: 1) 3 pound weight gain  in 24 hours or 5 pounds in 1 week 2) shortness of breath, with or without a dry hacking cough 3) swelling in the hands, feet or stomach 4) if you have to sleep on extra pillows at night in order to breathe.    Complete by:  As directed    Call MD for:  difficulty breathing, headache or visual disturbances    Complete by:  As directed    Call MD for:  temperature >100.4    Complete by:  As directed    Diet - low sodium heart healthy    Complete by:  As directed    Discharge instructions    Complete by:  As directed    You were seen for an acute exacerbation of your chronic heart failure. You were given IV medication to help remove the fluid from your lungs and to improve your breathing. Please continue taking your medications as when you leave the hospital. We want you to take 80 mg lasix (4 pills) twice a day instead of the previous 60 mg (3 pills) twice a day. You were also given potassium supplementation to make sure your electrolytes are not affected by this change. Please take all medications as prescribed and discuss these changes with your primary care physician. We made these changes so that you do not have to come into the hospital again for too much fluid on your lungs.   Please weigh yourself daily and contact your primary care provider if you gain >3lbs overnight. Please follow up with your PCP in the internal medicine clinic regarding your  hospitalization. We have scheduled you an appointment for Friday July 21, 2017 at 9:45 am in the Internal Medicine Clinic.   Increase activity slowly    Complete by:  As directed       Signed: Thomasene Ripple, MD 07/17/2017, 10:00 AM   Pager: [phone]

## 2017-07-15 NOTE — Progress Notes (Signed)
Initial Nutrition Assessment  DOCUMENTATION CODES:    Obesity unspecified  INTERVENTION:  Provide Glucerna Shake po BID, each supplement provides 220 kcal and 10 grams of protein.  Encourage adequate PO intake.   NUTRITION DIAGNOSIS:   Increased nutrient needs related to chronic illness as evidenced by estimated needs.  GOAL:   Patient will meet greater than or equal to 90% of their needs  MONITOR:   Supplement acceptance, PO intake, Weight trends, Labs, Skin, I & O's  REASON FOR ASSESSMENT:   Malnutrition Screening Tool    ASSESSMENT:   66 yo with a PMH of COPD on 2L home oxygen, combined systolic and diastolic CHF (echo with 76-81% in 02/2016) s/p ICD placement, HTN, HLD, T2DM, and obesity who is presenting with progressively worsening SOB and productive cough.   Meal completion has been 90%. Appetite has improved. Prior to admission, pt reports having a decreased appetite which had been ongoing for 1 week. She reports only consuming 1 meal a day which consisted of eggs, sausage, and toast and a light snack in the evening. Pt with a 5.8% weight loss in 2 days, likely related to fluid status. Pt currently diuresing. Pt is agreeable to nutritional supplements to aid in adequate nutrition. RD to order.   Pt with no observed significant fat or muscle mass loss.   Labs and medications reviewed.   Diet Order:  Diet Heart Room service appropriate? Yes; Fluid consistency: Thin  Skin:  Reviewed, no issues  Last BM:  10/10  Height:   Ht Readings from Last 1 Encounters:  07/14/17 5\' 1"  (1.549 m)    Weight:   Wt Readings from Last 1 Encounters:  07/14/17 210 lb (95.3 kg)    Ideal Body Weight:  47.7 kg  BMI:  Body mass index is 39.68 kg/m.  Estimated Nutritional Needs:   Kcal:  1800-2000  Protein:  95-115 grams  Fluid:  Per MD  EDUCATION NEEDS:   Education needs addressed  Corrin Parker, MS, RD, LDN Pager # [phone] After hours/ weekend pager #  [phone]

## 2017-07-15 NOTE — Progress Notes (Signed)
  Date: 07/15/2017  Patient name: [name]  Medical record number: [id number]  Date of birth: 1951-01-26   I have seen and evaluated this patient and I have discussed the plan of care with the house staff. Please see their note for complete details. I concur with their findings with the following additions/corrections:   [name] is a 66 year old woman with a history of HFrEF (EF 23-76%), diastolic dysfunction, COPD on 2 L home oxygen, HTN, HLD, and T2 DM who presented with worsening shortness of breath and productive cough. She she presented to the ER 2 days prior to this admission with similar symptoms, and was thought to be having a heart failure exacerbation. She was discharged home with instructions to increase her Lasix from 60 mg twice a day to 3 times a day. Since then, her symptoms of continuing to get worse. Before coming in, she was too weak to get out of bed. Her cough is productive of clear whitish mucus. She has had some chest pain which is mostly associated with the cough she has been having. Her exam is significant for bibasilar crackles with diminished breath sounds, JVD to the mid neck with hepatojugular reflex, and minimal lower extremity edema. She did not have any wheezing or prolonged expiratory phase.  My assessment is that she has acute decompensated heart failure, and that her increased home Lasix may have been ineffective due to gut edema. In any case, I think she will benefit from IV diuresis here. She does not really have any evidence of a COPD exacerbation except for increased sputum production. We will continue her home breathing treatments but hold off on antibiotics or steroids at this time, unless her clinical picture changes. She had a very slight NSTEMI on presentation with a troponin of 0.08, which was likely related to demand ischemia in the setting of decompensated heart failure. I appreciate cardiology seeing her, and they have a similar assessment of her  situation.  Oda Kilts, MD 07/15/2017, 5:26 PM

## 2017-07-16 DIAGNOSIS — N179 Acute kidney failure, unspecified: Secondary | ICD-10-CM

## 2017-07-16 DIAGNOSIS — E876 Hypokalemia: Secondary | ICD-10-CM

## 2017-07-16 LAB — BASIC METABOLIC PANEL
Anion gap: 14 (ref 5–15)
BUN: 17 mg/dL (ref 6–20)
CO2: 32 mmol/L (ref 22–32)
Calcium: 8.5 mg/dL — ABNORMAL LOW (ref 8.9–10.3)
Chloride: 92 mmol/L — ABNORMAL LOW (ref 101–111)
Creatinine, Ser: 1.54 mg/dL — ABNORMAL HIGH (ref 0.44–1.00)
GFR calc Af Amer: 40 mL/min — ABNORMAL LOW (ref >60)
GFR calc non Af Amer: 35 mL/min — ABNORMAL LOW (ref >60)
Glucose, Bld: 178 mg/dL — ABNORMAL HIGH (ref 65–99)
Potassium: 3.2 mmol/L — ABNORMAL LOW (ref 3.5–5.1)
Sodium: 138 mmol/L (ref 135–145)

## 2017-07-16 LAB — GLUCOSE, CAPILLARY
Glucose-Capillary: 196 mg/dL — ABNORMAL HIGH (ref 65–99)
Glucose-Capillary: 286 mg/dL — ABNORMAL HIGH (ref 65–99)
Glucose-Capillary: 215 mg/dL — ABNORMAL HIGH (ref 65–99)
Glucose-Capillary: 375 mg/dL — ABNORMAL HIGH (ref 65–99)

## 2017-07-16 LAB — HIV ANTIBODY (ROUTINE TESTING W REFLEX): HIV Screen 4th Generation wRfx: NONREACTIVE

## 2017-07-16 MED ORDER — FUROSEMIDE 80 MG PO TABS
80.0000 mg | ORAL_TABLET | Freq: Two times a day (BID) | ORAL | Status: DC
  Administered 2017-07-16 – 2017-07-17 (×2): 80 mg via ORAL
  Filled 2017-07-16 (×2): qty 1

## 2017-07-16 MED ORDER — POTASSIUM CHLORIDE CRYS ER 20 MEQ PO TBCR
40.0000 meq | EXTENDED_RELEASE_TABLET | Freq: Two times a day (BID) | ORAL | Status: DC
  Administered 2017-07-16 – 2017-07-17 (×3): 40 meq via ORAL
  Filled 2017-07-16 (×3): qty 2

## 2017-07-16 NOTE — Progress Notes (Signed)
   Subjective:  Patient was evaluated this morning. She was sitting up in a chair next to the sink. She had taken a shower this morning and states mild shortness of breath while bathing.  She denies chest pain. And reports improvement in her symptoms overnight.  Objective:  Vital signs in last 24 hours: Vitals:   07/15/17 1945 07/15/17 1953 07/16/17 0016 07/16/17 0554  BP: 125/62  (!) 142/69 120/60  Pulse: 92  86 79  Resp: 20  20 20   Temp: 98.4 F (36.9 C)  98.3 F (36.8 C) 97.7 F (36.5 C)  TempSrc: Oral  Oral Oral  SpO2: 100% 98% 99% 100%  Weight:    209 lb 11.2 oz (95.1 kg)  Height:       Physical Exam  Constitutional: No distress.  Obese   HENT:  Mouth/Throat: Oropharynx is clear and moist.  Neck: No JVD present.  Cardiovascular: Normal rate, regular rhythm and intact distal pulses.  Exam reveals no friction rub.   No murmur heard. Pulmonary/Chest: Effort normal. No respiratory distress. She has no wheezes.  No crackles appreciated  Abdominal: Soft. She exhibits no distension. There is no tenderness. There is no guarding.  Musculoskeletal: She exhibits no edema (trace edema at ankles bilaterally) or tenderness.  Skin: Skin is warm and dry. Capillary refill takes less than 2 seconds. No rash noted. No erythema.   Assessment/Plan:  Principal Problem:   Acute on chronic systolic (congestive) heart failure ([facility]) Active Problems:   Shortness of breath  Kaleb Linquist is a 66 yo with a PMH of COPD on 2L home oxygen, combined systolic and diastolic CHF (echo with 56-38% in 02/2016) s/p ICD placement, HTN, HLD, T2DM, and obesity who is presenting with progressively worsening SOB and productive cough. Cardiology consultation in the ED suggested acute on chronic heart failure. She was admitted to the internal medicine teaching service for management. The problems addressed during admission are as follows:  Acute on chronic HF exacerbation: Patient has history of combined systolic  and diastolic CHF with EF 93-73% seen on Echo this admission (unchanged from previous Echo).  She has been diuresing with IV lasix 80mg  BID.  On exam no bibasilar crackles noted.  She reports improvement in her breathing with IV lasix.  Will transition to PO lasix today. - PO lasix 80 BID - Daily BMP while on lasix -Hold home metoprolol 100 mg during acute CHF exacerbation  Hypokalemia K 3.2, giving kdur -repleating electrolytes  AKI Creatinine 1.54, will hold lisinopril.  Changing IV lasix to PO  Elevated troponin 2/2 demand ischemia: Troponin levels plateaued overnight without any complaints of chest pain overnight. Will continue to monitor.  -Continue telemetry while inpatient   URI: No wheezing, leukocytosis or fevers.  No signs of COPD exacerbation while inpatient.  Will continue to monitor.  -Albuterol nebulizer q6 hours PRN -DuoNebs q6 hours BRN -Dulera 2 puffs BID   T2DM:  -Holding home glipizide, metformin while inpatient - lantus 20 units qNightly, with SSI with meals  Hypertension: normotensive -Home Lisinopril 40 mg  Dispo: Anticipated discharge pending clinical improvement with IV diuresis.   Kalman Shan [place], DO 07/16/2017, 7:27 AM Pager: [phone]

## 2017-07-16 NOTE — Progress Notes (Signed)
  Date: 07/16/2017  Patient name: [name]  Medical record number: [id number]  Date of birth: 08/31/1951   I have seen and evaluated this patient and I have discussed the plan of care with the house staff. Please see their note for complete details. I concur with their findings with the following additions/corrections:   [name] reports feeling better today the wound but still with some dyspnea whenever she gets up to the bathroom. She still has some persistent crackles in her lung, although decreased from yesterday, and JVP is decreased slightly as well. Her creatinine has bumped significantly to 1.5 from 0.8. We will ease off on her diuresis and switch from IV to by mouth. I'm hopeful that she will be ready for discharge tomorrow.  Oda Kilts, MD 07/16/2017, 4:23 PM

## 2017-07-17 DIAGNOSIS — Z23 Encounter for immunization: Secondary | ICD-10-CM | POA: Diagnosis not present

## 2017-07-17 LAB — BASIC METABOLIC PANEL
Anion gap: 11 (ref 5–15)
BUN: 24 mg/dL — ABNORMAL HIGH (ref 6–20)
CO2: 33 mmol/L — ABNORMAL HIGH (ref 22–32)
Calcium: 8.6 mg/dL — ABNORMAL LOW (ref 8.9–10.3)
Chloride: 92 mmol/L — ABNORMAL LOW (ref 101–111)
Creatinine, Ser: 1.37 mg/dL — ABNORMAL HIGH (ref 0.44–1.00)
GFR calc Af Amer: 46 mL/min — ABNORMAL LOW (ref >60)
GFR calc non Af Amer: 40 mL/min — ABNORMAL LOW (ref >60)
Glucose, Bld: 207 mg/dL — ABNORMAL HIGH (ref 65–99)
Potassium: 3.9 mmol/L (ref 3.5–5.1)
Sodium: 136 mmol/L (ref 135–145)

## 2017-07-17 LAB — GLUCOSE, CAPILLARY
Glucose-Capillary: 188 mg/dL — ABNORMAL HIGH (ref 65–99)
Glucose-Capillary: 321 mg/dL — ABNORMAL HIGH (ref 65–99)

## 2017-07-17 MED ORDER — POTASSIUM CHLORIDE CRYS ER 20 MEQ PO TBCR: 40.0000 meq | EXTENDED_RELEASE_TABLET | Freq: Two times a day (BID) | ORAL | 0 refills | Status: DC

## 2017-07-17 MED ORDER — FUROSEMIDE 20 MG PO TABS: 80.0000 mg | ORAL_TABLET | Freq: Two times a day (BID) | ORAL | 1 refills | Status: DC

## 2017-07-17 NOTE — Progress Notes (Signed)
   Subjective:  Patient was seen sitting comfortably on edge of bed eating breakfast this morning in no acute distress. Patient states that her symptoms of SOB have continued to improve during the hospitalization and states she no longer feels SOB while walking around her room. She endorses pain with palpation of lower extremities, but notes that they are less swollen than they have been on previous occasions.   Objective:  Vital signs in last 24 hours: Vitals:   07/16/17 2015 07/17/17 0602 07/17/17 0751 07/17/17 0838  BP:  (!) 117/59 122/63   Pulse:  79 84   Resp:  18 20   Temp:  98 F (36.7 C) (!) 97.5 F (36.4 C)   TempSrc:  Tympanic Oral   SpO2: 100% 100% 100% 100%  Weight:  213 lb (96.6 kg)    Height:       Physical Exam  Constitutional:  Obese woman sitting comfortably on edge of bed in no acute distress with [place] in place.  Cardiovascular: Normal rate and regular rhythm.  Exam reveals no friction rub.   No murmur heard. Pulmonary/Chest:  No accessory muscle use or nasal flaring. No crackles or wheezing appreciated throughout all six lung fields, interval improvement from previous exams.  Musculoskeletal: She exhibits tenderness (with palpation of lower extremities). She exhibits no edema (of bilateral lower extremities).  Skin: Skin is warm and dry. Capillary refill takes less than 2 seconds. No rash noted. No erythema. No pallor.   Assessment/Plan:  Principal Problem:   Acute on chronic systolic (congestive) heart failure ([facility]) Active Problems:   Shortness of breath  Madie Cahn is a 66 yo with a PMH of COPD on 2L home oxygen, combined systolic and diastolic CHF (echo with 47-09% in 02/2016) s/p ICD placement, HTN, HLD, T2DM, and obesity who is presenting with progressively worsening SOB and productive cough. Cardiology consultation in the ED suggested acute on chronic heart failure. She was admitted to the internal medicine teaching service for management. The problems  addressed during admission are as follows:  Acute on chronic combined systolic/diastolic heart failure: Patient has continued to improve in symptoms of SOB at rest and with exertion after aggressive IV diuresis. Patient's echocardiography on 07/15/2017 showed grade 1 diastolic dysfunction with LVEF of 25-30%, similar to previous echocardiography and reassuring no worsening of baseline HF.Patient no tolerating oral diuretics today with continued improvement in her volume status on PE. Patient states she is back to baseline and ready for discharge.  -Oral Lasix, 80 mg BID -Restart home metoprolol 100 mg on discharge  AKI: Patient's Cr elevated to 1.54 from baseline 0.8-1.0 yesterday. Her creatinine down trending to 1.37 today with holding of home lisinopril 40 mg during hospitalization and transition to oral lasix 80 mg BID.   Elevated troponin 2/2 demand ischemia: Troponin levels plateaued overnight without any complaints of chest pain overnight. Will continue to monitor with telemetry while inpatient.   URI complicated by hx of COPD: Patient afebrile and cough improving with diuresis. No clinical signs of COPD exacerbation at this time and will continue to provide home COPD medication regimen.  -Albuterol nebulizer q6 hours PRN -DuoNebs q6 hours BRN -Dulera 2 puffs BID  T2DM: -Holding home glipizide, metformin while inpatient -Increase to lantus 20 units qNightly, with SSI with meals  Hypertension: -Restart home lisinopril 40 mg on discharge  Dispo: Anticipated discharge today.   Thomasene Ripple, MD 07/17/2017, 9:33 AM Pager: [phone]

## 2017-07-17 NOTE — Consult Note (Signed)
   [facility] Inpatient Consult   07/17/2017  [name] 02/09/1951 [id number]   Patient was assessed for [place] Management for community services in the [place]. Patient was previously active with [place] Management.  Met with patient at bedside regarding being restarted with [facility] services. Patient states she has a broken Nebulizer machine and has notified [place] and her primary MD office, [facility].  Patient states she would like follow up because needs to weigh daily and she needs to order her scale since she had to return the tele-monitoring scale back to [employer].  Consent form signed and folder with [place] Management information given.  Of note, [facility] Care Management services does not replace or interfere with any services that are arranged by inpatient case management or social work. For additional questions or referrals please contact:  Natividad Brood, RN BSN [place] [facility] Liaison  [phone] business mobile phone Toll free office [phone]

## 2017-07-17 NOTE — Progress Notes (Signed)
Offered Pt a bath, Pt stated that she is going home and will wash-up as she is getting dress to go home.

## 2017-07-17 NOTE — Progress Notes (Signed)
Orders received for pt discharge.  Discharge summary printed and reviewed with pt.  Explained medication regimen, and pt had no further questions at this time.  IV removed and site remains clean, dry, intact.  Telemetry removed.  Pt in stable condition and awaiting transport. 

## 2017-07-18 ENCOUNTER — Other Ambulatory Visit: Payer: Self-pay | Admitting: *Deleted

## 2017-07-18 NOTE — Patient Outreach (Signed)
[facility]) Care Management  07/18/2017  [name] November 03, 1950 [id number]   Covering for Thea Silversmith, RN -Initial Transition of care from discharged 10/15  Initial outreach attempt however unsuccessful. RN able to leave a HIPAA approved voice message requesting a call back. Will awaiting call back and update the assigned case manager accordingly.   Raina Mina, RN Care Management Coordinator [place] Office [phone]

## 2017-07-19 ENCOUNTER — Ambulatory Visit: Payer: Self-pay

## 2017-07-19 ENCOUNTER — Other Ambulatory Visit: Payer: Self-pay

## 2017-07-19 NOTE — Patient Outreach (Signed)
[facility]) Care Management  07/19/2017  LYNN RECENDIZ 07/24/1951 [id number]   Subjective: none  Objective: none  Assessment: 66 year old with recent admission 10/12-10/15 with acute on chronic heart failure. History of HTN, DM, COPD, depression.  RNCM called to complete transition of care. No answer. HIPPA compliant message left.  Thea Silversmith, RN, MSN, [place] Coordinator Cell: [phone]

## 2017-07-20 ENCOUNTER — Other Ambulatory Visit: Payer: Self-pay

## 2017-07-20 ENCOUNTER — Ambulatory Visit: Payer: Self-pay

## 2017-07-20 NOTE — Patient Outreach (Signed)
[facility] [facility]) Care Management  07/20/2017  FAYTH TREFRY 1951-06-15 [id number]   Subjective: none  Objective: none  Assessment: 66 year old with recent admission 10/12-10/15 with acute on chronic heart failure. History of HTN, DM, COPD, depression.  RNCM called to complete transition of care. No answer. HIPPA compliant message left. Third attempt.  Plan: per policy/procedure, RNCM will send outreach letter.  Thea Silversmith, RN, MSN, [place] Coordinator Cell: [phone]

## 2017-07-21 ENCOUNTER — Ambulatory Visit (INDEPENDENT_AMBULATORY_CARE_PROVIDER_SITE_OTHER): Payer: Medicare Other | Admitting: Internal Medicine

## 2017-07-21 ENCOUNTER — Encounter: Payer: Self-pay | Admitting: Internal Medicine

## 2017-07-21 VITALS — BP 134/73 | HR 75 | Temp 97.8°F | Ht 61.0 in | Wt 217.3 lb

## 2017-07-21 DIAGNOSIS — Z79899 Other long term (current) drug therapy: Secondary | ICD-10-CM | POA: Diagnosis not present

## 2017-07-21 DIAGNOSIS — F17210 Nicotine dependence, cigarettes, uncomplicated: Secondary | ICD-10-CM

## 2017-07-21 DIAGNOSIS — Z95810 Presence of automatic (implantable) cardiac defibrillator: Secondary | ICD-10-CM

## 2017-07-21 DIAGNOSIS — J449 Chronic obstructive pulmonary disease, unspecified: Secondary | ICD-10-CM | POA: Diagnosis not present

## 2017-07-21 DIAGNOSIS — Z7951 Long term (current) use of inhaled steroids: Secondary | ICD-10-CM

## 2017-07-21 DIAGNOSIS — E119 Type 2 diabetes mellitus without complications: Secondary | ICD-10-CM

## 2017-07-21 DIAGNOSIS — Z9981 Dependence on supplemental oxygen: Secondary | ICD-10-CM | POA: Diagnosis not present

## 2017-07-21 DIAGNOSIS — I110 Hypertensive heart disease with heart failure: Secondary | ICD-10-CM | POA: Diagnosis not present

## 2017-07-21 DIAGNOSIS — Z5189 Encounter for other specified aftercare: Secondary | ICD-10-CM | POA: Diagnosis not present

## 2017-07-21 DIAGNOSIS — E785 Hyperlipidemia, unspecified: Secondary | ICD-10-CM | POA: Diagnosis not present

## 2017-07-21 DIAGNOSIS — I5042 Chronic combined systolic (congestive) and diastolic (congestive) heart failure: Secondary | ICD-10-CM

## 2017-07-21 NOTE — Patient Instructions (Signed)
It was a pleasure to see you [name].  I am glad you are feeling better!  Please continue your current medications as follows: - Lasix 80 mg twice a day; you can take your evening pill earlier (around dinner) so you are not waking up at night to use the bathroom. - Potassium 40 mEq twice a day - Lisinopril 40 mg daily - Toprol XL 100 mg daily  Please check your weights every morning at home to keep a track of your weights.  If you see your weight go up 3 lbs in one day or 5 lbs in a week, give [place] a call as this might be a sign of gaining fluid.  We are getting blood work to check your kidney and potassium numbers.  Please follow up with Dr. Berline Lopes as scheduled or see [place] sooner if needed.

## 2017-07-21 NOTE — Progress Notes (Signed)
CC: CHF  HPI:  [name] is a 66 y.o. female with PMH as listed below including Chronic Combined Systolic and Diastolic CHF (EF 78-46% by TTE 07/15/17) s/p ICD, COPD, HTN, DM2, and HLD who presents for hospital follow up.  Chronic Combined Systolic and Diastolic CHF (EF 96-29% by TTE 07/15/17): Patient admitted from 10/12-10/15 for acute exacerbation of her congestive heart failure. Admission weight not recorded as far as I could find. She was diuresed with IV Lasix 80 mg BID (on 60 mg po BID at home previously) with improvement. Her weight trend was 209 lbs (10/14) and 213 lbs (10/15) on discharge. Her home Lasix was increased to 80 mg po BID and potassium supplement was increased to 40 mg BID. She did have a repeat TTE during admission which showed an LVEF of 25-30%, moderate diffuse hypokinesis, grade 1 diastolic dysfunction. EF slightly improved from prior 20-25% in May 2017.  Since discharge, patient feels very well. She brought her medications which I reviewed and she is taking them as prescribed including the changes to her Lasix and potassium as above. She does not have a scale at home but will obtain one to check daily weights. She denies any recent dyspnea, increased orthopnea from baseline (2 pillows under head at night), or PND. She does wake at night frequently to use the bathroom as she takes her Lasix before bedtime. She does have dyspnea on exertion (climbing a flight of stairs) which she says is unchanged from her baseline. She uses supplemental oxygen at night and as needed during the day when short of breath. She denies any chest pain, palpitations, or increased swelling in her feet or legs.   She is requesting a new order for her home nebulizer machine as hers is broken.   Past Medical History:  Diagnosis Date  . ATN (acute tubular necrosis) ([place]) 07/15/2014  . Automatic implantable cardioverter-defibrillator in situ   . Cardiac defibrillator in situ    Atlas II VR  (SJM) implanted by Dr Lovena Le  . CHF (congestive heart failure) ([place])   . Chronic combined systolic and diastolic heart failure (HCC)    a. EF 35-40% in past;  b. Echo 7/13:  EF 45-50%, Gr 2 diast dysfn, mild AI, mild MAC, trivial MR, mild LAE, PASP 47.  [name] Chronic ulcer of leg ([place])    04-09-15 resolved-not a problem.  . Colon polyps 04/12/2013   Rectosigmoid polyp  . COPD (chronic obstructive pulmonary disease) (HCC)    O2 at night  . Depression   . Diabetes mellitus   . Eczema   . Elevated alkaline phosphatase level    GGT and 5'nucleotidase 8/13 normal  . History of oxygen administration    oxygen @ 2 l/m nasally bedtime 24/7  . HTN (hypertension)   . Hx of cardiac cath    a. [place] 2003 normal;  b. LHC 6/13:  Mild calcification in the LM, o/w normal coronary arteries, EF 45%.   . Hyperlipidemia   . Hyperthyroidism, subclinical   . Implantable cardioverter-defibrillator (ICD) generator end of life   . Lipoma   . NICM (nonischemic cardiomyopathy) ([place])   . Obesity   . On home oxygen therapy    "2L; 24/7" (10/11/2016)  . Post menopausal syndrome   . Sleep apnea    pt denies 04/12/2013   Review of Systems:   Review of Systems  Respiratory: Negative for cough, hemoptysis, sputum production and shortness of breath.   Cardiovascular: Negative for chest  pain, palpitations, orthopnea, leg swelling and PND.  Genitourinary: Negative for dysuria.  Musculoskeletal: Negative for falls.  Neurological: Negative for loss of consciousness.     Physical Exam:  Vitals:   07/21/17 0932  BP: 134/73  Pulse: 75  Temp: 97.8 F (36.6 C)  TempSrc: Oral  SpO2: 96%  Weight: 217 lb 4.8 oz (98.6 kg)  Height: 5' 1"  (1.549 m)   Physical Exam  Constitutional: She is oriented to person, place, and time. She appears well-developed and well-nourished. No distress.  HENT:  Head: Normocephalic and atraumatic.  Cardiovascular: Normal rate and regular rhythm.   No murmur heard. ICD left chest wall.    Pulmonary/Chest: Effort normal. No respiratory distress. She has no wheezes. She has no rales.  Musculoskeletal: She exhibits no tenderness.  Trace edema both legs.  Neurological: She is alert and oriented to person, place, and time.  Skin: Skin is warm. She is not diaphoretic.    Assessment & Plan:   See Encounters Tab for problem based charting.  Patient seen with Dr. Rebeca Alert  Chronic combined systolic and diastolic congestive heart failure [facility]) Patient with chronic combined systolic and diastolic congestive heart failure and most recent LVEF of 25-30%. She appears to be doing well after her recent hospital admission. She has trace edema on both legs which she feels is unchanged from her baseline. Her lungs are clear on auscultation without crackles/rales. Her weight in clinic is 217 lbs today. Her hospital weights have been inconsistent, so I am not sure what her true dry weight is. Will continue current management and check BMET today. - Continue Lasix 80 mg PO BID; can take evening dose earlier with dinner to limit nighttime waking - Continue Lisinopril 40 mg daily - Continue Toprol XL 100 mg daily - Continue KDur 40 mEq BID - Check BMET for renal function and potassium levels - Check weights at home; call for increase in weight of 3 lbs in one day or 5 lbs in one week  COPD (chronic obstructive pulmonary disease) ([place]) She denies any dyspnea. Continues to use supplemental O2 at 2 L during the night and as needed during the day. O2 is 96% on room air today with clear lungs. She is requesting a new order for her home nebulizer machine. - DME nebulizer machine - Continue Advair 250-50 mcg 1 puff BID - Continue Spiriva 18 mcg daily - Continue Albuterol q6h prn

## 2017-07-21 NOTE — Assessment & Plan Note (Addendum)
Patient with chronic combined systolic and diastolic congestive heart failure and most recent LVEF of 25-30%. She appears to be doing well after her recent hospital admission. She has trace edema on both legs which she feels is unchanged from her baseline. Her lungs are clear on auscultation without crackles/rales. Her weight in clinic is 217 lbs today. Her hospital weights have been inconsistent, so I am not sure what her true dry weight is. Will continue current management and check BMET today. - Continue Lasix 80 mg PO BID; can take evening dose earlier with dinner to limit nighttime waking - Continue Lisinopril 40 mg daily - Continue Toprol XL 100 mg daily - Continue KDur 40 mEq BID - Check BMET for renal function and potassium levels - Check weights at home; call for increase in weight of 3 lbs in one day or 5 lbs in one week  ADDENDUM 07/24/17: BMET shows elevated potassium of 6.3. Will d/c potassium supplement. Attempted to call patient without answer to notify of change. Will need repeat BMET on return to clinic.

## 2017-07-21 NOTE — Assessment & Plan Note (Signed)
She denies any dyspnea. Continues to use supplemental O2 at 2 L during the night and as needed during the day. O2 is 96% on room air today with clear lungs. She is requesting a new order for her home nebulizer machine. - DME nebulizer machine - Continue Advair 250-50 mcg 1 puff BID - Continue Spiriva 18 mcg daily - Continue Albuterol q6h prn

## 2017-07-22 LAB — BMP8+ANION GAP
Anion Gap: 19 mmol/L — ABNORMAL HIGH (ref 10.0–18.0)
BUN/Creatinine Ratio: 28 (ref 12–28)
BUN: 36 mg/dL — ABNORMAL HIGH (ref 8–27)
CO2: 25 mmol/L (ref 20–29)
Calcium: 10 mg/dL (ref 8.7–10.3)
Chloride: 95 mmol/L — ABNORMAL LOW (ref 96–106)
Creatinine, Ser: 1.3 mg/dL — ABNORMAL HIGH (ref 0.57–1.00)
GFR calc Af Amer: 50 mL/min/{1.73_m2} — ABNORMAL LOW (ref >59)
GFR calc non Af Amer: 44 mL/min/{1.73_m2} — ABNORMAL LOW (ref >59)
Glucose: 300 mg/dL — ABNORMAL HIGH (ref 65–99)
Potassium: 6.3 mmol/L — ABNORMAL HIGH (ref 3.5–5.2)
Sodium: 139 mmol/L (ref 134–144)

## 2017-07-24 ENCOUNTER — Telehealth: Payer: Self-pay | Admitting: *Deleted

## 2017-07-24 NOTE — Telephone Encounter (Signed)
-----   Message from Zada Finders, MD sent at 07/24/2017 12:03 PM EDT ----- BMET shows elevated potassium of 6.3. Will d/c potassium supplement. Attempted to call patient without answer to notify of change. Will need repeat BMET on return to clinic.

## 2017-07-24 NOTE — Telephone Encounter (Signed)
Called pt again no answer

## 2017-07-24 NOTE — Telephone Encounter (Signed)
Called pt - no answer; left message to give me a call back. 

## 2017-07-24 NOTE — Addendum Note (Signed)
Addended by: Zada Finders R on: 07/24/2017 12:03 PM   Modules accepted: Orders

## 2017-07-25 NOTE — Telephone Encounter (Signed)
Called pt - no answer; left message to give us a call back. 

## 2017-07-29 DIAGNOSIS — J449 Chronic obstructive pulmonary disease, unspecified: Secondary | ICD-10-CM | POA: Diagnosis not present

## 2017-07-29 NOTE — Progress Notes (Signed)
Internal Medicine Clinic Attending  I saw and evaluated the patient.  I personally confirmed the key portions of the history and exam documented by Dr. Posey Pronto and I reviewed pertinent patient test results.  The assessment, diagnosis, and plan were formulated together and I agree with the documentation in the resident's note.  Here for hospital follow up after admission for CHF exacerbation. Looking improved since discharge, taking medicines without difficulty, breathing improved. She is planning to start weighing herself daily.   Unfortunately, K was elevated and we have been unable to reach her. She needs close follow up for this to ensure she stops her K supplement and gets it rechecked.   Oda Kilts, MD

## 2017-08-02 NOTE — Telephone Encounter (Signed)
Yes pls. Her hyperkalemia was 9 days ago but if she cont to take KCl 40 BID + ACE, it could cont to worsen. Wellness check pls. Will the police call the after hours pager so IM resident can give instructions?

## 2017-08-02 NOTE — Telephone Encounter (Signed)
Does she normally answer all of her calls?

## 2017-08-02 NOTE — Telephone Encounter (Signed)
Dr Software engineer, dr Berline Lopes should we continue to call pt? Should we do a wellness check with police?

## 2017-08-02 NOTE — Telephone Encounter (Signed)
Have called the 3 numbers lm at 2, pt ph goes straight to vmail

## 2017-08-03 NOTE — Telephone Encounter (Signed)
Called non emergency [employer], ask for wellness check, pt rtc, ask her to stop K+, scheduled appt for 11/6 due to pt using transportation services. She reported back that she "feels good" and will stop the lasix. Order for labs and [facility] appt done per dr Software engineer.

## 2017-08-03 NOTE — Telephone Encounter (Signed)
Error in last note, pt is stopping K+ not lasix

## 2017-08-04 ENCOUNTER — Other Ambulatory Visit: Payer: Self-pay

## 2017-08-04 NOTE — Telephone Encounter (Signed)
Thanks [name]  

## 2017-08-04 NOTE — Patient Outreach (Signed)
[facility]) Care Management  08/04/2017  [name] Jan 24, 1951 [id number]   Subjective: none  Objective: none  Assessment: 66 year old with recent admission 10/12-10/15 with acute on chronic heart failure. History of HTN, DM, COPD, depression.  Called x3 and outreach letter sent with no response.  Plan: per policy/procedure RNCM will close case.  Thea Silversmith, RN, MSN, [place] Coordinator Cell: [phone]

## 2017-08-07 ENCOUNTER — Encounter: Payer: Self-pay | Admitting: *Deleted

## 2017-08-07 ENCOUNTER — Other Ambulatory Visit: Payer: Self-pay | Admitting: Internal Medicine

## 2017-08-07 DIAGNOSIS — H524 Presbyopia: Secondary | ICD-10-CM | POA: Diagnosis not present

## 2017-08-07 DIAGNOSIS — H2513 Age-related nuclear cataract, bilateral: Secondary | ICD-10-CM | POA: Diagnosis not present

## 2017-08-07 DIAGNOSIS — E119 Type 2 diabetes mellitus without complications: Secondary | ICD-10-CM | POA: Diagnosis not present

## 2017-08-07 DIAGNOSIS — E875 Hyperkalemia: Secondary | ICD-10-CM

## 2017-08-07 LAB — HM DIABETES EYE EXAM

## 2017-08-08 ENCOUNTER — Other Ambulatory Visit: Payer: Medicare Other

## 2017-08-08 ENCOUNTER — Other Ambulatory Visit: Payer: Self-pay | Admitting: Internal Medicine

## 2017-08-08 ENCOUNTER — Ambulatory Visit (INDEPENDENT_AMBULATORY_CARE_PROVIDER_SITE_OTHER): Payer: Medicare Other | Admitting: Internal Medicine

## 2017-08-08 ENCOUNTER — Encounter: Payer: Self-pay | Admitting: Internal Medicine

## 2017-08-08 VITALS — BP 161/73 | HR 97 | Temp 98.2°F | Ht 61.0 in | Wt 222.0 lb

## 2017-08-08 DIAGNOSIS — E875 Hyperkalemia: Secondary | ICD-10-CM | POA: Insufficient documentation

## 2017-08-08 DIAGNOSIS — I5042 Chronic combined systolic (congestive) and diastolic (congestive) heart failure: Secondary | ICD-10-CM

## 2017-08-08 DIAGNOSIS — I10 Essential (primary) hypertension: Secondary | ICD-10-CM

## 2017-08-08 DIAGNOSIS — R0601 Orthopnea: Secondary | ICD-10-CM

## 2017-08-08 DIAGNOSIS — I110 Hypertensive heart disease with heart failure: Secondary | ICD-10-CM | POA: Diagnosis not present

## 2017-08-08 DIAGNOSIS — Z79811 Long term (current) use of aromatase inhibitors: Secondary | ICD-10-CM | POA: Diagnosis not present

## 2017-08-08 DIAGNOSIS — Z79899 Other long term (current) drug therapy: Secondary | ICD-10-CM

## 2017-08-08 HISTORY — DX: Hyperkalemia: E87.5

## 2017-08-08 LAB — BASIC METABOLIC PANEL
Anion gap: 9 (ref 5–15)
BUN: 14 mg/dL (ref 6–20)
CO2: 26 mmol/L (ref 22–32)
Calcium: 9.3 mg/dL (ref 8.9–10.3)
Chloride: 106 mmol/L (ref 101–111)
Creatinine, Ser: 0.92 mg/dL (ref 0.44–1.00)
GFR calc Af Amer: >60 mL/min (ref >60)
GFR calc non Af Amer: >60 mL/min (ref >60)
Glucose, Bld: 185 mg/dL — ABNORMAL HIGH (ref 65–99)
Potassium: 3.9 mmol/L (ref 3.5–5.1)
Sodium: 141 mmol/L (ref 135–145)

## 2017-08-08 MED ORDER — METOPROLOL SUCCINATE ER 50 MG PO TB24: 150.0000 mg | ORAL_TABLET | Freq: Every day | ORAL | 1 refills | Status: DC

## 2017-08-08 NOTE — Progress Notes (Signed)
CC: Hyperkalemia re-check  HPI:  [name] is a 66 y.o. female with a past medical history listed below here today for follow up of her hyperkalemia.  [name] was recently seen in clinic on 10/19 for hospital follow for HFrEF exacerbation. At follow up visit her BMET returned with elevated potassium at 6.8. Numerous attempts were made to follow up with the patient but she did not answer or respond to phone calls. Eventually the police department was called and she agreed to come in to the clinic for re-check.   Today, she reports that she has been doing well and has no complaints. Does report one episode of palpations two weeks ago, lasted a few seconds. No further episodes since. She denies any chest pain, shortness of breath, nausea/vomting, weakness or fatigue. Does reports she has stopped taking the potassium supplement as instructed. Has continued to take her ACE-I.   For her heart failure, she is currently prescribed Lasix 80 mg bid, lisinopril 40 mg daily, toprol XL 100 mg daily. Weight today is 222 lbs; worsened from 217 lbs at last visit. She reports she does check her weight daily. Reports it usually is around 217 lbs and has been stable. Denies any  PND, LE edema. Reports stable 2 pillow orthopnea. BP is elevated at 161/73 and HR 97.  Past Medical History:  Diagnosis Date  . ATN (acute tubular necrosis) ([place]) 07/15/2014  . Automatic implantable cardioverter-defibrillator in situ   . Cardiac defibrillator in situ    Atlas II VR (SJM) implanted by Dr Lovena Le  . CHF (congestive heart failure) ([place])   . Chronic combined systolic and diastolic heart failure (HCC)    a. EF 35-40% in past;  b. Echo 7/13:  EF 45-50%, Gr 2 diast dysfn, mild AI, mild MAC, trivial MR, mild LAE, PASP 47.  [name] Chronic ulcer of leg ([place])    04-09-15 resolved-not a problem.  . Colon polyps 04/12/2013   Rectosigmoid polyp  . COPD (chronic obstructive pulmonary disease) (HCC)    O2 at night  . Depression     . Diabetes mellitus   . Eczema   . Elevated alkaline phosphatase level    GGT and 5'nucleotidase 8/13 normal  . History of oxygen administration    oxygen @ 2 l/m nasally bedtime 24/7  . HTN (hypertension)   . Hx of cardiac cath    a. [place] 2003 normal;  b. LHC 6/13:  Mild calcification in the LM, o/w normal coronary arteries, EF 45%.   . Hyperlipidemia   . Hyperthyroidism, subclinical   . Implantable cardioverter-defibrillator (ICD) generator end of life   . Lipoma   . NICM (nonischemic cardiomyopathy) ([place])   . Obesity   . On home oxygen therapy    "2L; 24/7" (10/11/2016)  . Post menopausal syndrome   . Sleep apnea    pt denies 04/12/2013   Review of Systems:   No chest pain or shortness of breath  Physical Exam:  Vitals:   08/08/17 0945  BP: (!) 161/73  Pulse: 97  Temp: 98.2 F (36.8 C)  TempSrc: Oral  SpO2: 98%  Weight: 222 lb (100.7 kg)  Height: 5' 1"  (1.549 m)   GENERAL- alert, co-operative, appears as stated age, not in any distress. CARDIAC- RRR, no murmurs, rubs or gallops. No JVD. RESP- Moving equal volumes of air, and clear to auscultation bilaterally, no wheezes or crackles. ABDOMEN- Soft, nontender, bowel sounds present. EXTREMITIES- pulse 2+, symmetric, trace pedal edema. SKIN-  Warm, dry, No rash or lesion. PSYCH- Normal mood and affect, appropriate thought content and speech.   Assessment & Plan:   See Encounters Tab for problem based charting.  Patient discussed with Dr. Evette Doffing

## 2017-08-08 NOTE — Assessment & Plan Note (Signed)
Patient asymptomatic today thought weight is up 5 lbs from previous visit. No signs of volume overload on exam.   Continue lasix 80 mg bid.

## 2017-08-08 NOTE — Assessment & Plan Note (Signed)
BP Readings from Last 3 Encounters:  08/08/17 (!) 161/73  07/21/17 134/73  07/17/17 123/66    Lab Results  Component Value Date   NA 141 08/08/2017   K 3.9 08/08/2017   CREATININE 0.92 08/08/2017   BP elevated today at 161/73 with pulse 97. She reports compliance with her Lasix 80 mg bid, Lisinopril 40 mg daily, Toprol XL 100 mg daily.   A/P: Increase Toprol XL to 150 mg. Continue other medications.

## 2017-08-08 NOTE — Assessment & Plan Note (Signed)
BMET today with K improved at 3.8. Patient asymptomatic today. Reports she has continued to take her ACE-I.   Will continue her current ACE-I and Lasix dosing. No further K supplementation.  Re-check BMET at follow up visit to ensure K remains stable.

## 2017-08-08 NOTE — Patient Instructions (Addendum)
[name],  Your potassium level has returned back to normal. Your blood pressure and heart rate are elveated however. I would like to increase the dose of your metoprolol from 100 mg daily to 150 mg daily. Your weight is also up today. Please continue to check your weight daily and if you see your weight go up 3 lbs in one day or 5 lbs in a week, give [place] a call as this might be a sign of gaining fluid.  Please return in 1 month for re-check.

## 2017-08-09 NOTE — Progress Notes (Signed)
Internal Medicine Clinic Attending  Case discussed with Dr. Boswell at the time of the visit.  We reviewed the resident's history and exam and pertinent patient test results.  I agree with the assessment, diagnosis, and plan of care documented in the resident's note.  

## 2017-08-17 ENCOUNTER — Encounter: Payer: Medicare Other | Admitting: Internal Medicine

## 2017-08-29 DIAGNOSIS — J449 Chronic obstructive pulmonary disease, unspecified: Secondary | ICD-10-CM | POA: Diagnosis not present

## 2017-09-06 ENCOUNTER — Encounter: Payer: Medicare Other | Admitting: Internal Medicine

## 2017-09-22 ENCOUNTER — Encounter: Payer: Self-pay | Admitting: *Deleted

## 2017-09-28 DIAGNOSIS — J449 Chronic obstructive pulmonary disease, unspecified: Secondary | ICD-10-CM | POA: Diagnosis not present

## 2017-10-29 DIAGNOSIS — J449 Chronic obstructive pulmonary disease, unspecified: Secondary | ICD-10-CM | POA: Diagnosis not present

## 2017-11-08 ENCOUNTER — Encounter: Payer: Medicare Other | Admitting: Internal Medicine

## 2017-11-14 ENCOUNTER — Encounter: Payer: Self-pay | Admitting: Internal Medicine

## 2017-11-14 ENCOUNTER — Ambulatory Visit (INDEPENDENT_AMBULATORY_CARE_PROVIDER_SITE_OTHER): Payer: Medicare Other | Admitting: Internal Medicine

## 2017-11-14 VITALS — BP 140/80 | HR 88 | Ht 61.0 in | Wt 224.0 lb

## 2017-11-14 DIAGNOSIS — I428 Other cardiomyopathies: Secondary | ICD-10-CM

## 2017-11-14 DIAGNOSIS — Z95810 Presence of automatic (implantable) cardiac defibrillator: Secondary | ICD-10-CM | POA: Diagnosis not present

## 2017-11-14 DIAGNOSIS — I5042 Chronic combined systolic (congestive) and diastolic (congestive) heart failure: Secondary | ICD-10-CM

## 2017-11-14 NOTE — Progress Notes (Signed)
HPI [name] returns today for ongoing evaluation and management of her CHF, NICM, s/p ICD insertion. The patient has been stable in the interim. She is still smoking and using oxygen despite being told otherwise. She has mild peripheral edema. No syncope. No ICD shock. No chest pain.  Allergies  Allergen Reactions  . Actos [Pioglitazone] Other (See Comments)    REACTION: congestive heart failure   . Naproxen Other (See Comments)    531m dose made her sleep for two days  . Rosiglitazone Hives  . Hydrocortisone Other (See Comments)    unknown     Current Outpatient Medications  Medication Sig Dispense Refill  . albuterol (PROVENTIL) (2.5 MG/3ML) 0.083% nebulizer solution USE 1 VIAL VIA NEBULIZER EVERY 6 HOURS AS NEEDED FOR WHEEZING OR SHORTNESS OF BREATH 1125 mL 3  . aspirin EC 81 MG tablet Take 1 tablet (81 mg total) by mouth daily. 90 tablet 3  . atorvastatin (LIPITOR) 80 MG tablet Take 1 tablet (80 mg total) by mouth at bedtime. 90 tablet 3  . betamethasone dipropionate (DIPROLENE) 0.05 % cream Apply topically 2 (two) times daily. (Patient taking differently: Apply topically daily as needed. ) 135 g 0  . diphenhydrAMINE (BENADRYL) 25 mg capsule Take 1 capsule (25 mg total) by mouth every 6 (six) hours as needed. 30 capsule 1  . Fluticasone-Salmeterol (ADVAIR DISKUS) 250-50 MCG/DOSE AEPB Inhale 1 puff into the lungs 2 (two) times daily. 3 each 3  . glipiZIDE (GLUCOTROL) 5 MG tablet Take 1 tablet (5 mg total) by mouth 2 (two) times daily before a meal. 180 tablet 3  . Insulin Glargine (LANTUS) 100 UNIT/ML Solostar Pen Inject 35 Units into the skin daily. 15 mL 11  . insulin lispro (HUMALOG KWIKPEN) 100 UNIT/ML KiwkPen Take 7 units with breakfast, 7 units with lunch, and 5 units with dinner. 15 mL 3  . metFORMIN (GLUCOPHAGE-XR) 500 MG 24 hr tablet Take 4 tablets (2,000 mg total) by mouth daily with breakfast. 120 tablet 3  . metoprolol succinate (TOPROL-XL) 50 MG 24 hr tablet  Take 3 tablets (150 mg total) daily by mouth. 90 tablet 1  . nitroGLYCERIN (NITROSTAT) 0.4 MG SL tablet Place 0.4 mg under the tongue every 5 (five) minutes as needed for chest pain. Reported on 03/18/2016    . pantoprazole (PROTONIX) 40 MG tablet Take 1 tablet (40 mg total) by mouth daily. 30 tablet 2  . tiotropium (SPIRIVA) 18 MCG inhalation capsule Place 1 capsule (18 mcg total) into inhaler and inhale daily. 30 capsule 11  . furosemide (LASIX) 20 MG tablet Take 4 tablets (80 mg total) by mouth 2 (two) times daily. 240 tablet 1  . lisinopril (PRINIVIL,ZESTRIL) 20 MG tablet Take 2 tablets (40 mg total) by mouth daily. 60 tablet 3   No current facility-administered medications for this visit.      Past Medical History:  Diagnosis Date  . [place] 07/15[place]  . Automatic implantable cardioverter-defibrillator in situ   . Cardiac defibrillator in situ    Atlas II VR (SJM) implanted by Dr TLovena Le . CH[place]estive heart failure) ([place]   . Chronic combined systolic and diastolic heart failure (HCC)    a. EF 35-40% in past;  b. Echo 7/13:  EF 45-50%, Gr 2 diast dysfn, mild AI, mild MAC, trivial MR, mild LAE, PASP 47.  .Marland [place]ronic ulcer of leg ([place]    04-09-15 resolved-not a problem.  . Colon polyps 04/12/2013  Rectosigmoid polyp  . COPD (chronic obstructive pulmonary disease) (HCC)    O2 at night  . Depression   . Diabetes mellitus   . Eczema   . Elevated alkaline phosphatase level    GGT and 5'nucleotidase 8/13 normal  . History of oxygen administration    oxygen @ 2 l/m nasally bedtime 24/7  . HTN (hypertension)   . Hx of cardiac cath    a. [place] 2003 normal;  b. LHC 6/13:  Mild calcification in the LM, o/w normal coronary arteries, EF 45%.   . Hyperlipidemia   . Hyperthyroidism, subclinical   . Implantable cardioverter-defibrillator (ICD) generator end of life   . Lipoma   . NICM (nonischemic cardiomyopathy) ([place])   . Obesity   . On home oxygen therapy      "2L; 24/7" (10/11/2016)  . Post menopausal syndrome   . Sleep apnea    pt denies 04/12/2013    ROS:   All systems reviewed and negative except as noted in the HPI.   Past Surgical History:  Procedure Laterality Date  . ABDOMINAL HYSTERECTOMY    . CARDIAC CATHETERIZATION    . CARDIAC CATHETERIZATION N/A 06/21/2016   Procedure: Left Heart Cath and Coronary Angiography;  Surgeon: Sherren Mocha, MD;  Location: [facility];  Service: Cardiovascular;  Laterality: N/A;  . CARDIAC DEFIBRILLATOR PLACEMENT  05/04/2007   SJM Atlas II VR ICD  . CARDIAC DEFIBRILLATOR PLACEMENT    . COLONOSCOPY N/A 04/12/2013   Procedure: COLONOSCOPY;  Surgeon: Beryle Beams, MD;  Location: [facility];  Service: Endoscopy;  Laterality: N/A;  pt.has defibrilator  . COLONOSCOPY N/A 04/16/2015   Procedure: COLONOSCOPY;  Surgeon: [name], MD;  Location: [facility];  Service: Endoscopy;  Laterality: N/A;  . ESOPHAGOGASTRODUODENOSCOPY N/A 04/16/2015   Procedure: ESOPHAGOGASTRODUODENOSCOPY (EGD);  Surgeon: [name], MD;  Location: [facility];  Service: Endoscopy;  Laterality: N/A;  . HERNIA REPAIR    . HYSTEROSCOPY    . IMPLANTABLE CARDIOVERTER DEFIBRILLATOR (ICD) GENERATOR CHANGE N/A 04/02/2014   Procedure: ICD GENERATOR CHANGE;  Surgeon: Evans Lance, MD;  Location: [facility] CATH LAB;  Service: Cardiovascular;  Laterality: N/A;  . INSERT / REPLACE / REMOVE PACEMAKER    . LEFT HEART CATHETERIZATION WITH CORONARY ANGIOGRAM N/A 04/02/2012   Procedure: LEFT HEART CATHETERIZATION WITH CORONARY ANGIOGRAM;  Surgeon: Hillary Bow, MD;  [facility];  Service: Cardiovascular;  Laterality: N/A;  . TUBAL LIGATION       Family History  Problem Relation Age of Onset  . Stroke Mother   . Seizures Father   . Heart disease Father   . Diabetes Sister   . Asthma Maternal Aunt        aunts  . Asthma Maternal Uncle        uncles  . Heart disease Paternal Aunt        aunts  . Heart disease  Paternal Uncle        uncles  . Heart disease Maternal Aunt        aunts  . Heart disease Maternal Uncle        uncles  . Heart disease Maternal Grandfather   . Colon cancer Neg Hx   . Colon polyps Neg Hx   . Esophageal cancer Neg Hx   . Kidney disease Neg Hx   . Gallbladder disease Neg Hx      Social History   Socioeconomic History  . Marital status: Widowed  Spouse name: Alroy Dust  . Number of children: 3  . Years of education: 57  . Highest education level: Not on file  Social Needs  . Financial resource strain: Not on file  . Food insecurity - worry: Not on file  . Food insecurity - inability: Not on file  . Transportation needs - medical: Not on file  . Transportation needs - non-medical: Not on file  Occupational History  . Occupation: disabled  Tobacco Use  . Smoking status: Current Every Day Smoker    Packs/day: 0.30    Years: 39.00    Pack years: 11.70    Types: Cigarettes  . Smokeless tobacco: Never Used  . Tobacco comment: 1 cig/day.  Substance and Sexual Activity  . Alcohol use: No    Alcohol/week: 0.6 oz    Types: 1 Glasses of wine per week  . Drug use: No  . Sexual activity: Not Currently  Other Topics Concern  . Not on file  Social History Narrative   ** Merged History Encounter **       Married     BP 140/80   Pulse 88   Ht 5' 1"  (1.549 m)   Wt 224 lb (101.6 kg)   BMI 42.32 kg/m   Physical Exam:  obese appearing 67 yo woman, NAD HEENT: Unremarkable Neck:  7 cm JVD, no thyromegally Lymphatics:  No adenopathy Back:  No CVA tenderness Lungs:  Clear with no wheezes HEART:  Regular rate rhythm, no murmurs, no rubs, no clicks Abd:  soft, positive bowel sounds, no organomegally, no rebound, no guarding Ext:  2 plus pulses, 1+ edema, no cyanosis, no clubbing Skin:  No rashes no nodules Neuro:  CN II through XII intact, motor grossly intact   DEVICE  Normal device function.  See PaceArt for details.   Assess/Plan: 1. Chronic  systolic heart failure - her symptoms are class 2. She has some peripheral edema. I have encouraged the patient to reduce her salt intake. 2. ICD - her St. Jude single chamber ICD is working normally. I have asked her to followup in several months.  3. Tobacco abuse - she is encouraged to stop smoking.  [name].D.

## 2017-11-14 NOTE — Patient Instructions (Signed)
Medication Instructions:  Your physician recommends that you continue on your current medications as directed. Please refer to the Current Medication list given to you today.  Labwork: None ordered.  Testing/Procedures: None ordered.  Follow-Up: You will follow up with device clinic in 3 months for a device check.  Your physician wants you to follow-up in: one year with Dr. Lovena Le.   You will receive a reminder letter in the mail two months in advance. If you don't receive a letter, please call our office to schedule the follow-up appointment.  Any Other Special Instructions Will Be Listed Below (If Applicable).  If you need a refill on your cardiac medications before your next appointment, please call your pharmacy.

## 2017-11-15 LAB — CUP PACEART INCLINIC DEVICE CHECK
Date Time Interrogation Session: 20190213100317
Implantable Lead Implant Date: 20080801
Implantable Lead Location: 753860
Implantable Lead Model: 7120
Implantable Pulse Generator Implant Date: 20150701
Pulse Gen Serial Number: 7206538

## 2017-11-29 DIAGNOSIS — J449 Chronic obstructive pulmonary disease, unspecified: Secondary | ICD-10-CM | POA: Diagnosis not present

## 2017-12-13 ENCOUNTER — Other Ambulatory Visit: Payer: Self-pay

## 2017-12-13 ENCOUNTER — Encounter: Payer: Self-pay | Admitting: Internal Medicine

## 2017-12-13 ENCOUNTER — Ambulatory Visit (INDEPENDENT_AMBULATORY_CARE_PROVIDER_SITE_OTHER): Payer: Medicare Other | Admitting: Internal Medicine

## 2017-12-13 VITALS — BP 152/72 | HR 86 | Temp 97.7°F | Wt 215.6 lb

## 2017-12-13 DIAGNOSIS — Z9114 Patient's other noncompliance with medication regimen: Secondary | ICD-10-CM | POA: Diagnosis not present

## 2017-12-13 DIAGNOSIS — Z794 Long term (current) use of insulin: Secondary | ICD-10-CM

## 2017-12-13 DIAGNOSIS — I110 Hypertensive heart disease with heart failure: Secondary | ICD-10-CM | POA: Diagnosis not present

## 2017-12-13 DIAGNOSIS — I10 Essential (primary) hypertension: Secondary | ICD-10-CM

## 2017-12-13 DIAGNOSIS — Z79899 Other long term (current) drug therapy: Secondary | ICD-10-CM | POA: Diagnosis not present

## 2017-12-13 DIAGNOSIS — J449 Chronic obstructive pulmonary disease, unspecified: Secondary | ICD-10-CM | POA: Diagnosis not present

## 2017-12-13 DIAGNOSIS — E119 Type 2 diabetes mellitus without complications: Secondary | ICD-10-CM

## 2017-12-13 DIAGNOSIS — E875 Hyperkalemia: Secondary | ICD-10-CM

## 2017-12-13 DIAGNOSIS — E1122 Type 2 diabetes mellitus with diabetic chronic kidney disease: Secondary | ICD-10-CM

## 2017-12-13 DIAGNOSIS — I5042 Chronic combined systolic (congestive) and diastolic (congestive) heart failure: Secondary | ICD-10-CM | POA: Diagnosis not present

## 2017-12-13 DIAGNOSIS — E1142 Type 2 diabetes mellitus with diabetic polyneuropathy: Secondary | ICD-10-CM

## 2017-12-13 DIAGNOSIS — R011 Cardiac murmur, unspecified: Secondary | ICD-10-CM | POA: Diagnosis not present

## 2017-12-13 DIAGNOSIS — R21 Rash and other nonspecific skin eruption: Secondary | ICD-10-CM

## 2017-12-13 DIAGNOSIS — N182 Chronic kidney disease, stage 2 (mild): Secondary | ICD-10-CM

## 2017-12-13 LAB — BASIC METABOLIC PANEL WITH GFR
Anion gap: 9 (ref 5–15)
BUN: 10 mg/dL (ref 6–20)
CO2: 31 mmol/L (ref 22–32)
Calcium: 9 mg/dL (ref 8.9–10.3)
Chloride: 98 mmol/L — ABNORMAL LOW (ref 101–111)
Creatinine, Ser: 0.88 mg/dL (ref 0.44–1.00)
GFR calc Af Amer: >60 mL/min
GFR calc non Af Amer: >60 mL/min
Glucose, Bld: 337 mg/dL — ABNORMAL HIGH (ref 65–99)
Potassium: 4.1 mmol/L (ref 3.5–5.1)
Sodium: 138 mmol/L (ref 135–145)

## 2017-12-13 LAB — POCT GLYCOSYLATED HEMOGLOBIN (HGB A1C): Hemoglobin A1C: 10.2

## 2017-12-13 LAB — GLUCOSE, CAPILLARY: Glucose-Capillary: 319 mg/dL — ABNORMAL HIGH (ref 65–99)

## 2017-12-13 MED ORDER — METOPROLOL SUCCINATE ER 50 MG PO TB24: 150.0000 mg | ORAL_TABLET | Freq: Every day | ORAL | 3 refills | Status: DC

## 2017-12-13 MED ORDER — FUROSEMIDE 40 MG PO TABS: 80.0000 mg | ORAL_TABLET | Freq: Two times a day (BID) | ORAL | 5 refills | Status: DC

## 2017-12-13 MED ORDER — GLIPIZIDE 5 MG PO TABS: 5.0000 mg | ORAL_TABLET | Freq: Two times a day (BID) | ORAL | 3 refills | Status: DC

## 2017-12-13 MED ORDER — LISINOPRIL 20 MG PO TABS: 40.0000 mg | ORAL_TABLET | Freq: Every day | ORAL | 3 refills | Status: DC

## 2017-12-13 MED ORDER — BETAMETHASONE DIPROPIONATE 0.05 % EX CREA: TOPICAL_CREAM | Freq: Two times a day (BID) | CUTANEOUS | 0 refills | Status: DC

## 2017-12-13 MED ORDER — SPIRONOLACTONE 25 MG PO TABS: 12.5000 mg | ORAL_TABLET | Freq: Every day | ORAL | 0 refills | Status: DC

## 2017-12-13 NOTE — Progress Notes (Signed)
   CC: Routine visit evaluation of her congestive heart failure, COPD, hypertension.  HPI:[name] is a 67 y.o. female who presents today for evaluation of her chronic medical conditions.  Please see individual problem based assessment and plan for additional information.  ROS is negative as per HPI  Past Medical History:  Diagnosis Date  . ATN (acute tubular necrosis) ([place]) 07/15/2014  . Automatic implantable cardioverter-defibrillator in situ   . Cardiac defibrillator in situ    Atlas II VR (SJM) implanted by Dr Peggy Le  . CHF (congestive heart failure) ([place])   . Chronic combined systolic and diastolic heart failure (HCC)    a. EF 35-40% in past;  b. Echo 7/13:  EF 45-50%, Gr 2 diast dysfn, mild AI, mild MAC, trivial MR, mild LAE, PASP 47.  [name] Chronic ulcer of leg ([place])    04-09-15 resolved-not a problem.  . Colon polyps 04/12/2013   Rectosigmoid polyp  . COPD (chronic obstructive pulmonary disease) (HCC)    O2 at night  . Depression   . Diabetes mellitus   . Eczema   . Elevated alkaline phosphatase level    GGT and 5'nucleotidase 8/13 normal  . History of oxygen administration    oxygen @ 2 l/m nasally bedtime 24/7  . HTN (hypertension)   . Hx of cardiac cath    a. [place] 2003 normal;  b. LHC 6/13:  Mild calcification in the LM, o/w normal coronary arteries, EF 45%.   . Hyperlipidemia   . Hyperthyroidism, subclinical   . Implantable cardioverter-defibrillator (ICD) generator end of life   . Lipoma   . NICM (nonischemic cardiomyopathy) ([place])   . Obesity   . On home oxygen therapy    "2L; 24/7" (10/11/2016)  . Post menopausal syndrome   . Sleep apnea    pt denies 04/12/2013   Review of Systems:  ROS negative except as per HPI.  Physical Exam:  Vitals:   12/13/17 1637  BP: (!) 152/72  Pulse: 86  Temp: 97.7 F (36.5 C)  TempSrc: Oral  SpO2: 93%  Weight: 215 lb 9.6 oz (97.8 kg)   General: In no acute distress, nondiaphoretic, overall appearance is  good. Neurological: No acute focal abnormality, patient is alert and oriented x3 HEENT: PERRL, EOM Cardiovascular : RRR, mild grade 2 systolic murmur best heard left upper sternal border Pulmonary: Breath sounds clear to auscultation bilaterally upper and lower lung fields no wheezing rhonchi appreciated GI: Abdomen is soft, nontender, nondistended, Psych: Patient appropriate mood and affect   Assessment & Plan:   See Encounters Tab for problem based charting.  Patient discussed with Dr. Evette Doffing

## 2017-12-13 NOTE — Patient Instructions (Signed)
Thank you for your visit to the [facility].  FOLLOW-UP INSTRUCTIONS When: 1 month For: BP check and BMP What to bring: Your medications  Please follow-up in one month in our acute care clinic for a blood pressure check and BMP. I will see you in three months or sooner if needed.  I have ordered a lab today and I will call you with any abnormal results.   If you have any questions please feel free to call.   Heart Failure Eating Plan Heart failure, also called congestive heart failure, occurs when your heart does not pump blood well enough to meet your body's needs for oxygen-rich blood. Heart failure is a long-term (chronic) condition. Living with heart failure can be challenging. However, following your health care provider's instructions about a healthy lifestyle and working with a diet and nutrition specialist (dietitian) to choose the right foods may help to improve your symptoms. What are tips for following this plan? General guidelines  Do not eat more than 2,300 mg of salt (sodium) a day. The amount of sodium that is recommended for you may be lower, depending on your condition.  Maintain a healthy body weight as directed. Ask your health care provider what a healthy weight is for you. ? Check your weight every day. ? Work with your health care provider and dietitian to make a plan that is right for you to lose weight or maintain your current weight.  Limit how much fluid you drink. Ask your health care provider or dietitian how much fluid you can have each day.  Limit or avoid alcohol as told by your health care provider or dietitian. Reading food labels  Check food labels for the amount of sodium per serving. Choose foods that have less than 140 mg (milligrams) of sodium in each serving.  Check food labels for the number of calories per serving. This is important if you need to limit your daily calorie intake to lose weight.  Check food labels for the serving size. If  you eat more than one serving, you will be eating more sodium and calories than what is listed on the label.  Look for foods that are labeled as "sodium-free," "very low sodium," or "low sodium." ? Foods labeled as "reduced sodium" or "lightly salted" may still have more sodium than what is recommended for you. Cooking  Avoid adding salt when cooking. Ask your health care provider or dietitian before using salt substitutes.  Season food with salt-free seasonings, spices, or herbs. Check the label of seasoning mixes to make sure they do not contain salt.  Cook with heart-healthy oils, such as olive, canola, soybean, or sunflower oil.  Do not fry foods. Cook foods using low-fat methods, such as baking, boiling, grilling, and broiling.  Limit unhealthy fats when cooking by: ? Removing the skin from poultry, such as chicken. ? Removing all visible fats from meats. ? Skimming the fat off from stews, soups, and gravies before serving them. Meal planning  Limit your intake of: ? Processed, canned, or pre-packaged foods. ? Foods that are high in trans fat, such as fried foods. ? Sweets, desserts, sugary drinks, and other foods with added sugar. ? Full-fat dairy products, such as whole milk.  Eat a balanced diet that includes: ? 4-5 servings of fruit each day and 4-5 servings of vegetables each day. At each meal, try to fill half of your plate with fruits and vegetables. ? Up to 6-8 servings of whole grains each  day. ? Up to 2 servings of lean meat, poultry, or fish each day. One serving of meat is equal to 3 oz. This is about the same size as a deck of cards. ? 2 servings of low-fat dairy each day. ? Heart-healthy fats. Healthy fats called omega-3 fatty acids are found in foods such as flaxseed and cold-water fish like sardines, salmon, and mackerel.  Aim to eat 25-35 g (grams) of fiber a day. Foods that are high in fiber include apples, broccoli, carrots, beans, peas, and whole  grains.  Do not add salt or condiments that contain salt (such as soy sauce) to foods before eating.  When eating at a restaurant, ask that your food be prepared with less salt or no salt, if possible.  Try to eat 2 or more vegetarian meals each week.  Eat more home-cooked food and eat less restaurant, buffet, and fast food. Recommended foods The items listed may not be a complete list. Talk with your dietitian about what dietary choices are best for you. Grains Bread with less than 80 mg of sodium per slice. Whole-wheat pasta, quinoa, and brown rice. Oats and oatmeal. Barley. [place]. Grits and cream of wheat. Whole-grain and whole-wheat cold cereal. Vegetables All fresh vegetables. Vegetables that are frozen without sauce or added salt. Low-sodium or sodium-free canned vegetables. Fruits All fresh, frozen, and canned fruits. Dried fruits, such as raisins, prunes, and cranberries. Meats and other protein foods Lean cuts of meat. Skinless chicken and [place]. Fish with high omega-3 fatty acids, such as salmon, sardines, and other cold-water fishes. Eggs. Dried beans, peas, and edamame. Unsalted nuts and nut butters. Dairy Low-fat or nonfat (skim) milk and dried milk. Rice milk, soy milk, and almond milk. Low-fat or nonfat yogurt. Small amounts of reduced-sodium block cheese. Low-sodium cottage cheese. Fats and oils Olive, canola, soybean, flaxseed, or sunflower oil. Avocado. Sweets and desserts Apple sauce. Granola bars. Sugar-free pudding and gelatin. Frozen fruit bars. Seasoning and other foods Fresh and dried herbs. Lemon or lime juice. Vinegar. Low-sodium ketchup. Salt-free marinades, salad dressings, sauces, and seasonings. Foods to avoid The items listed may not be a complete list. Talk with your dietitian about what dietary choices are best for you. Grains Bread with more than 80 mg of sodium per slice. Hot or cold cereal with more than 140 mg sodium per serving. Salted pretzels  and crackers. Pre-packaged breadcrumbs. Bagels, croissants, and biscuits. Vegetables Canned vegetables. Frozen vegetables with sauce or seasonings. Creamed vegetables. [place] fries. Onion rings. Pickled vegetables and sauerkraut. Fruits Fruits that are dried with sodium-containing preservatives. Meats and other protein foods Ribs and chicken wings. Bacon, ham, pepperoni, bologna, salami, and packaged luncheon meats. Hot dogs, bratwurst, and sausage. Canned meat. Smoked meat and fish. Salted nuts and seeds. Dairy Whole milk, half-and-half, and cream. Buttermilk. Processed cheese, cheese spreads, and cheese curds. Regular cottage cheese. Feta cheese. Shredded cheese. String cheese. Fats and oils Butter, lard, shortening, ghee, and bacon fat. Canned and packaged gravies. Seasoning and other foods Onion salt, garlic salt, table salt, and sea salt. Marinades. Regular salad dressings. Relishes, pickles, and olives. Meat flavorings and tenderizers, and bouillon cubes. Horseradish, ketchup, and mustard. Worcestershire sauce. Teriyaki sauce, soy sauce (including reduced sodium). Hot sauce and Tabasco sauce. Steak sauce, fish sauce, oyster sauce, and cocktail sauce. Taco seasonings. Barbecue sauce. Tartar sauce. Summary  A heart failure eating plan includes changes that limit your intake of sodium and unhealthy fat, and it may help you lose weight or maintain  a healthy weight. Your health care provider may also recommend limiting how much fluid you drink.  Most people with heart failure should eat no more than 2,300 mg of salt (sodium) a day. The amount of sodium that is recommended for you may be lower, depending on your condition.  Contact your health care provider or dietitian before making any major changes to your diet. This information is not intended to replace advice given to you by your health care provider. Make sure you discuss any questions you have with your health care provider. Document  Released: 02/03/2017 Document Revised: 02/03/2017 Document Reviewed: 02/03/2017 Elsevier Interactive Patient Education  2018 [employer].

## 2017-12-14 ENCOUNTER — Encounter: Payer: Self-pay | Admitting: Internal Medicine

## 2017-12-14 NOTE — Assessment & Plan Note (Signed)
Single episode of hyperkalemia as per chart review that appears to have resolved.  BMP today Potassium of 4.1. I feel it is safe to restart her spironolactone at this time. We will have her return in one month for a BMP with her BP check to monitor her potassium.

## 2017-12-14 NOTE — Assessment & Plan Note (Signed)
Patient stated that she has not been recording her blood glucose readings regularly over the past month as she misplaced her supplies and medications following a recent move approximately one month prior. Her HgbA1c was noted to be elevated today at 10.2 from 9.4 in September of 2018. Unfortunately at that visit she was noted to have an increased A1c to 9.4 from 8.4. It was thought at that time that she needed to needed to increase her prandial insulin and decrease the long acting. She was then to follow-up in one month but due to a hospitalization for acute on chronic HFrEF this was not addressed. She was again seen in November of 2018 whereupon her hyperkalemia was addressed.   As she has been noncompliant with her home treatment regimen we discussed the risks associated with failing to take her insulin such as DKA and the potential lethality of such an illness. She agreed to take her insulin as previously prescribed with 35 units Lantus QHS, and 7, 7 and 5 units with each meal respectively. She is to bring her log with her in her one month follow-up BP visit to be evaluated.

## 2017-12-14 NOTE — Assessment & Plan Note (Addendum)
Most recent echocardiogram following her last acute on chronic HFrEF episode in October of 2018 revealed an LV EF of 25-30%. She presents today without apparent signs of fluid overload. She denies dyspnea on exertion above baseline, denied increased orthopnea or peripheral edema. She attested to being compliant with her lasix 80mg  BID on the majority of days. She is notably down ~7lbs from her weight at her last visit several months prior. But ~2lbs up from her discharge weight last October.  As per chart review it appears that her aldosterone antagonist was discontinued without clear indication as to why. As such, we will initiate a low dose of 25mg  today.   Plan: BMP today without eveidence of renal insult or potassium derangement.  -Continue Lasix 80 mg PO BID but ordered 40mg  tablets to decrease the total number taken daily - Continue Lisinopril 40 mg daily - Continue Toprol XL 150 mg daily - Check weights at home; call for increase in weight of 3 lbs in one day or 5 lbs in one week - Initiated spironolactone therapy.   Please consider IMDUR at her next appointment if her BP tolerates the additional medications.

## 2017-12-14 NOTE — Assessment & Plan Note (Signed)
In patients w/ decreased cardiac output, optimal BP control has been associated with a positive mortality benefit.  Unfortunately, the patient has been particularly noncompliant with her BP medications over the past 30 days.  As such, we will initiate therapy with low dose spironolactone but otherwise continue her current medications. She is to return in one month for a BP check along with her CBG readings.

## 2017-12-15 NOTE — Progress Notes (Signed)
Internal Medicine Clinic Attending  Case discussed with Dr. Harbrecht at the time of the visit.  We reviewed the resident's history and exam and pertinent patient test results.  I agree with the assessment, diagnosis, and plan of care documented in the resident's note.   

## 2017-12-27 DIAGNOSIS — J449 Chronic obstructive pulmonary disease, unspecified: Secondary | ICD-10-CM | POA: Diagnosis not present

## 2018-01-12 ENCOUNTER — Other Ambulatory Visit: Payer: Self-pay

## 2018-01-12 ENCOUNTER — Encounter (INDEPENDENT_AMBULATORY_CARE_PROVIDER_SITE_OTHER): Payer: Self-pay

## 2018-01-12 ENCOUNTER — Ambulatory Visit (INDEPENDENT_AMBULATORY_CARE_PROVIDER_SITE_OTHER): Payer: Medicare Other | Admitting: Internal Medicine

## 2018-01-12 DIAGNOSIS — E119 Type 2 diabetes mellitus without complications: Secondary | ICD-10-CM

## 2018-01-12 DIAGNOSIS — N182 Chronic kidney disease, stage 2 (mild): Secondary | ICD-10-CM

## 2018-01-12 DIAGNOSIS — Z794 Long term (current) use of insulin: Secondary | ICD-10-CM

## 2018-01-12 DIAGNOSIS — K3184 Gastroparesis: Secondary | ICD-10-CM

## 2018-01-12 DIAGNOSIS — E1122 Type 2 diabetes mellitus with diabetic chronic kidney disease: Secondary | ICD-10-CM

## 2018-01-12 DIAGNOSIS — Z79899 Other long term (current) drug therapy: Secondary | ICD-10-CM

## 2018-01-12 DIAGNOSIS — E1143 Type 2 diabetes mellitus with diabetic autonomic (poly)neuropathy: Secondary | ICD-10-CM

## 2018-01-12 DIAGNOSIS — I10 Essential (primary) hypertension: Secondary | ICD-10-CM

## 2018-01-12 DIAGNOSIS — E1142 Type 2 diabetes mellitus with diabetic polyneuropathy: Secondary | ICD-10-CM

## 2018-01-12 MED ORDER — FUROSEMIDE 80 MG PO TABS: 80.0000 mg | ORAL_TABLET | Freq: Two times a day (BID) | ORAL | 11 refills | Status: DC

## 2018-01-12 MED ORDER — EMPAGLIFLOZIN-METFORMIN HCL 5-500 MG PO TABS: 1.0000 | ORAL_TABLET | Freq: Every day | ORAL | 1 refills | Status: DC

## 2018-01-12 MED ORDER — LISINOPRIL 40 MG PO TABS: 40.0000 mg | ORAL_TABLET | Freq: Every day | ORAL | 11 refills | Status: DC

## 2018-01-12 MED ORDER — METOPROLOL SUCCINATE ER 50 MG PO TB24: 100.0000 mg | ORAL_TABLET | Freq: Every day | ORAL | 3 refills | Status: DC

## 2018-01-12 NOTE — Assessment & Plan Note (Signed)
Patient is here for blood pressure follow-up.  She is not taken her medications today, blood pressure is 144/64.  She has her medications with her and we have done a thorough medication reconciliation.  She is taking all medicines correctly except metoprolol, she has been taking only 100 mg, rather than 150 mg daily (two 50 mg tablets versus three).  Otherwise she is prescribed lisinopril 40 mg daily (has been taking 20 mg tablet x 2), spironolactone 12.5 mg daily, and Lasix 80 mg BID (taking 20 mg tablets x4 twice daily).  I sent new prescriptions for her Lasix and lisinopril for 80 mg tablets and 40 mg tablets respectively, in order to reduce her pill burden and improve compliance. Otherwise we will not make changes today.  -- Continue lisinopril 40 mg daily -- Continue Metoprolol ER 100 mg daily (50 mg x 2) -- Continue Lasix 80 mg BID -- Continue Spironolactone 12.5 mg daily  -- F/u BMP

## 2018-01-12 NOTE — Patient Instructions (Addendum)
FOLLOW-UP INSTRUCTIONS When: 1 month For: DM What to bring: Medications, glucose meter  [name],  It was a pleasure to meet you.  I have sent new prescriptions for your lisinopril and Lasix to your pharmacy for higher dose tablets in order to reduce your pill burden.  Please stop taking glipizide.  I sent a new prescription for empagliflozin-metformin. please take 1 tablet daily.  Please continue to take your other medications as previously prescribed. Follow-up with [place] again in 1 month. Thank you!  - Dr. Philipp Ovens

## 2018-01-12 NOTE — Progress Notes (Signed)
Internal Medicine Clinic Attending  Case discussed with Dr. Philipp Ovens  at the time of the visit.  We reviewed the resident's history and exam and pertinent patient test results.  I agree with the assessment, diagnosis, and plan of care documented in the resident's note.  Oda Kilts, MD

## 2018-01-12 NOTE — Assessment & Plan Note (Addendum)
Patient has a history of uncontrolled type 2 diabetes.  Last A1c was 10.2.  She is prescribed metformin 2000 mg daily, glipizide 5 mg BID, Lantus 35 units qhs, and humalog 7 units with breakfast, 7 units with lunch, and 5 units with dinner.  She reports she has not been taking metformin since recent hospitalization where it was discontinued due to AKI.  Renal function has since recovered to normal baseline.  She is concerned today due to a couple of low readings on her glucometer in the early mornings.  He reports she was asymptomatic.  Review of her glucometer today reveals a total of 23 readings.  Average CBG of 172, with a range of 415-61.  Patient is within range 48% of the time, above range 40% of the time, with 12% (3 readings) below range, lowest being 61.  We will plan to continue current insulin regimen for now.  We will stop glipizide as this can contribute to hypoglycemic episodes.  Given her high CVD risk, she is agreeable to starting SGL T-2 inhibitor.  We will also restart metformin. -- Continue current insulin regimen (Lantus 35 units QHS, Humalog 7 units with breakfast, 7 units with lunch, and 5 units with dinner).  -- Stop glipizide  -- Start Synjardy (Empagliflozin-metformin) 5-500 mg daily, up titrate at next visit -- Follow up 1 month with glucometer

## 2018-01-12 NOTE — Progress Notes (Signed)
   CC: BP follow up  HPI:  [name] is a 67 y.o. female with past medical history outlined below here for BP follow up. For the details of today's visit, please refer to the assessment and plan.  Past Medical History:  Diagnosis Date  . ATN (acute tubular necrosis) ([place]) 07/15/2014  . Automatic implantable cardioverter-defibrillator in situ   . Cardiac defibrillator in situ    Atlas II VR (SJM) implanted by Dr Lovena Le  . CHF (congestive heart failure) ([place])   . Chronic combined systolic and diastolic heart failure (HCC)    a. EF 35-40% in past;  b. Echo 7/13:  EF 45-50%, Gr 2 diast dysfn, mild AI, mild MAC, trivial MR, mild LAE, PASP 47.  [name] Chronic ulcer of leg ([place])    04-09-15 resolved-not a problem.  . Colon polyps 04/12/2013   Rectosigmoid polyp  . COPD (chronic obstructive pulmonary disease) (HCC)    O2 at night  . Depression   . Diabetes mellitus   . Eczema   . Elevated alkaline phosphatase level    GGT and 5'nucleotidase 8/13 normal  . History of oxygen administration    oxygen @ 2 l/m nasally bedtime 24/7  . HTN (hypertension)   . Hx of cardiac cath    a. [place] 2003 normal;  b. LHC 6/13:  Mild calcification in the LM, o/w normal coronary arteries, EF 45%.   . Hyperlipidemia   . Hyperthyroidism, subclinical   . Implantable cardioverter-defibrillator (ICD) generator end of life   . Lipoma   . NICM (nonischemic cardiomyopathy) ([place])   . Obesity   . On home oxygen therapy    "2L; 24/7" (10/11/2016)  . Post menopausal syndrome   . Sleep apnea    pt denies 04/12/2013    ROS  Physical Exam:  Vitals:   01/12/18 0946  BP: (!) 144/64  Pulse: 75  Temp: 98.1 F (36.7 C)  TempSrc: Oral  SpO2: 96%  Weight: 218 lb 8 oz (99.1 kg)  Height: _0  (1.549 m)    Constitutional: NAD, appears comfortable Cardiovascular: RRR, no murmurs, rubs, or gallops.  Pulmonary/Chest: CTAB, no wheezes, rales, or rhonchi. Extremities: Warm and well perfused. No edema.  Psychiatric:  Normal mood and affect  Assessment & Plan:   See Encounters Tab for problem based charting.  Patient discussed with Dr. Rebeca Alert

## 2018-01-16 ENCOUNTER — Telehealth: Payer: Self-pay | Admitting: *Deleted

## 2018-01-16 NOTE — Telephone Encounter (Signed)
Call made to patient of behalf of Geriatric Task force-pt seen 01/12/2018 in [facility].  courtesy call made to follow up recent visit for DM and HTN.  Also wanted to make sure pt was able to obtain synjardy without getting a prior authorization. No answer-message left on recorder for call back.Marland KitchenMarland KitchenDespina Hidden Cassady4/16/20192:58 PM    (copied from previous visit on 04/12) DM Continue current insulin regimen (Lantus 35 units QHS, Humalog 7 units with breakfast, 7 units with lunch, and 5 units with dinner).  -- Stop glipizide  -- Start Synjardy (Empagliflozin-metformin) 5-500 mg daily, up titrate at next visit -- Follow up 1 month with glucometer  (copied from previous visit on 04/12) HTN -- Continue lisinopril 40 mg daily -- Continue Metoprolol ER 100 mg daily (50 mg x 2) -- Continue Lasix 80 mg BID -- Continue Spironolactone 12.5 mg daily  -- F/u BMP

## 2018-01-18 NOTE — Addendum Note (Signed)
Addended by: Truddie Crumble on: 01/18/2018 09:08 AM   Modules accepted: Orders

## 2018-01-27 DIAGNOSIS — J449 Chronic obstructive pulmonary disease, unspecified: Secondary | ICD-10-CM | POA: Diagnosis not present

## 2018-02-12 ENCOUNTER — Ambulatory Visit (INDEPENDENT_AMBULATORY_CARE_PROVIDER_SITE_OTHER): Payer: Medicare Other | Admitting: *Deleted

## 2018-02-12 DIAGNOSIS — I428 Other cardiomyopathies: Secondary | ICD-10-CM | POA: Diagnosis not present

## 2018-02-12 DIAGNOSIS — I5042 Chronic combined systolic (congestive) and diastolic (congestive) heart failure: Secondary | ICD-10-CM

## 2018-02-12 LAB — CUP PACEART INCLINIC DEVICE CHECK
Battery Remaining Longevity: 70 mo
Brady Statistic RV Percent Paced: 0 %
Date Time Interrogation Session: 20190513112019
HighPow Impedance: 50.3264
Implantable Lead Implant Date: 20080801
Implantable Lead Location: 753860
Implantable Lead Model: 7120
Implantable Pulse Generator Implant Date: 20150701
Lead Channel Impedance Value: 437.5 Ohm
Lead Channel Pacing Threshold Amplitude: 0.5 V
Lead Channel Pacing Threshold Amplitude: 0.5 V
Lead Channel Pacing Threshold Pulse Width: 0.5 ms
Lead Channel Pacing Threshold Pulse Width: 0.5 ms
Lead Channel Sensing Intrinsic Amplitude: 11.9 mV
Lead Channel Setting Pacing Amplitude: 2.5 V
Lead Channel Setting Pacing Pulse Width: 0.5 ms
Lead Channel Setting Sensing Sensitivity: 0.5 mV
Pulse Gen Serial Number: 7206538

## 2018-02-12 NOTE — Progress Notes (Signed)
ICD check in clinic. Normal device function. Threshold and sensing consistent with previous device measurements. Impedance trends stable over time. No evidence of any ventricular arrhythmias. Histogram distribution appropriate for patient and level of activity. CorVue trend stable. Device programmed at appropriate safety margins. Device programmed to optimize intrinsic conduction. Estimated longevity 5.9 years. ROV with [facility] 05/14/18 and with [facility] in February 2020.

## 2018-02-26 DIAGNOSIS — J449 Chronic obstructive pulmonary disease, unspecified: Secondary | ICD-10-CM | POA: Diagnosis not present

## 2018-02-28 ENCOUNTER — Ambulatory Visit: Payer: Medicare Other | Admitting: Pharmacist

## 2018-02-28 ENCOUNTER — Other Ambulatory Visit: Payer: Self-pay

## 2018-02-28 ENCOUNTER — Ambulatory Visit (INDEPENDENT_AMBULATORY_CARE_PROVIDER_SITE_OTHER): Payer: Medicare Other | Admitting: Internal Medicine

## 2018-02-28 ENCOUNTER — Encounter: Payer: Self-pay | Admitting: Internal Medicine

## 2018-02-28 VITALS — BP 138/68 | HR 78 | Temp 97.8°F | Ht 61.0 in | Wt 215.5 lb

## 2018-02-28 DIAGNOSIS — Z79899 Other long term (current) drug therapy: Secondary | ICD-10-CM | POA: Diagnosis not present

## 2018-02-28 DIAGNOSIS — J449 Chronic obstructive pulmonary disease, unspecified: Secondary | ICD-10-CM | POA: Diagnosis not present

## 2018-02-28 DIAGNOSIS — E1122 Type 2 diabetes mellitus with diabetic chronic kidney disease: Secondary | ICD-10-CM | POA: Diagnosis not present

## 2018-02-28 DIAGNOSIS — E1142 Type 2 diabetes mellitus with diabetic polyneuropathy: Secondary | ICD-10-CM | POA: Diagnosis not present

## 2018-02-28 DIAGNOSIS — Z794 Long term (current) use of insulin: Secondary | ICD-10-CM

## 2018-02-28 DIAGNOSIS — Z7951 Long term (current) use of inhaled steroids: Secondary | ICD-10-CM | POA: Diagnosis not present

## 2018-02-28 DIAGNOSIS — E875 Hyperkalemia: Secondary | ICD-10-CM | POA: Diagnosis not present

## 2018-02-28 DIAGNOSIS — N182 Chronic kidney disease, stage 2 (mild): Secondary | ICD-10-CM

## 2018-02-28 DIAGNOSIS — I10 Essential (primary) hypertension: Secondary | ICD-10-CM

## 2018-02-28 LAB — POCT GLYCOSYLATED HEMOGLOBIN (HGB A1C): Hemoglobin A1C: 11.4 % — AB (ref 4.0–5.6)

## 2018-02-28 LAB — GLUCOSE, CAPILLARY: Glucose-Capillary: 221 mg/dL — ABNORMAL HIGH (ref 65–99)

## 2018-02-28 MED ORDER — EMPAGLIFLOZIN-METFORMIN HCL 5-500 MG PO TABS: 2.0000 | ORAL_TABLET | Freq: Every day | ORAL | 3 refills | Status: DC

## 2018-02-28 MED ORDER — EMPAGLIFLOZIN-METFORMIN HCL 12.5-500 MG PO TABS: 1.0000 | ORAL_TABLET | Freq: Every day | ORAL | 3 refills | Status: DC

## 2018-02-28 NOTE — Progress Notes (Signed)
CC: Routine evaluation, COPD and diabetes  HPI:[name] is a 67 y.o. female who presents today for evaluation of chronic medical admission conditions including hypertension, COPD, diabetes.  HTN: Stage one hypertension level today at 138/68. I will check her BMP and possibly up-titrate her spironolactone if tolerated.  Continue lisinopril 40 mg daily Continue Metoprolol ER 100 mg daily (50 mg x 2) Continue Lasix 80 mg BID Continue Spironolactone 12.5 mg daily   COPD: Denied dyspnea or acute pulmonary concerns. Saturating at 98% on room air following a prolonged walk to the office. No changes indicated  Plan: Continue advair 250-67mg 1 puff BID Continue Spiriva 154m daily Continue Albuterol q6hr PRN  DMII: Last HgbA1c 10.2 on 12/13/2017 and prior to that 9.4 on 06/21/2017. Glipizide was stopped at her last appointment due to concerns for hypoglycemia. An SGLT2 inhibitor, Empagliflozin, was instead initiated as a combined tablet with metformin.   Plan: Repeat A1c today--11.4 Continue current insulin regimen (Lantus 35 units QHS, Humalog 7 units with breakfast, 7 units with lunch, and 5 units with dinner).  Continue Synjardy (Empagliflozin-metformin) but increased to 12.01-999 mg daily  Return in three months for routine visit  Potassium: BMP today--  Past Medical History:  Diagnosis Date  . [place]  . Automatic implantable cardioverter-defibrillator in situ   . Cardiac defibrillator in situ    Atlas II VR (SJM) implanted by Dr TaLovena Le. CHF (congestive heart failure) ([place]  . Chronic combined systolic and diastolic heart failure (HCC)    a. EF 35-40% in past;  b. Echo 7/13:  EF 45-50%, Gr 2 diast dysfn, mild AI, mild MAC, trivial MR, mild LAE, PASP 47.  . Marland Kitchenhronic ulcer of leg ([place]   04-09-15 resolved-not a problem.  . Colon polyps 04/12/2013   Rectosigmoid polyp  . COPD (chronic obstructive pulmonary disease) (HCC)    O2 at  night  . Depression   . Diabetes mellitus   . Diabetes mellitus, type 2 ([place]7/11/2011   HIGH RISK FEET.. Please have patient take shoes and socks off every visit for visual foot inspection.    . Eczema   . Elevated alkaline phosphatase level    GGT and 5'nucleotidase 8/13 normal  . History of oxygen administration    oxygen @ 2 l/m nasally bedtime 24/7  . HTN (hypertension)   . Hx of cardiac cath    a. [place] normal;  b. LHC 6/13:  Mild calcification in the LM, o/w normal coronary arteries, EF 45%.   . Hyperlipidemia   . Hyperlipidemia associated with type 2 diabetes mellitus ([place]CFenn[place]8/22/2008   Qualifier: Diagnosis of  By: MaHassell DoneNP, NyTori Milks  . Hyperthyroidism, subclinical   . Implantable cardioverter-defibrillator (ICD) generator end of life   . Lipoma   . NICM (nonischemic cardiomyopathy) ([place]  . Obesity   . On home oxygen therapy    "2L; 24/7" (10/11/2016)  . Post menopausal syndrome   . Sleep apnea    pt denies 04/12/2013   Review of Systems:  ROS negative except as per HPI.  Physical Exam:  Vitals:   02/28/18 1315  BP: 138/68  Pulse: 78  Temp: 97.8 F (36.6 C)  TempSrc: Oral  SpO2: 98%  Weight: 215 lb 8 oz (97.8 kg)  Height: _0  (1.549 m)   Physical Exam  Constitutional: She is oriented to person, place, and time. She appears well-developed and well-nourished. No distress.  HENT:  Head: Normocephalic.  Cardiovascular: Normal rate and regular rhythm.  No murmur heard. Pulmonary/Chest: Effort normal and breath sounds normal. No stridor. No respiratory distress.  Abdominal: Soft. Bowel sounds are normal. She exhibits no distension. There is no tenderness.  Musculoskeletal: She exhibits no edema or tenderness.  Neurological: She is alert and oriented to person, place, and time.  Skin: Skin is warm. Capillary refill takes less than 2 seconds. She is not diaphoretic.  Psychiatric: She has a normal mood and affect.  Vitals reviewed.  Assessment & Plan:    See Encounters Tab for problem based charting.  Patient discussed with Dr. Beryle Beams

## 2018-02-28 NOTE — Patient Instructions (Addendum)
FOLLOW-UP INSTRUCTIONS When: In three months for a routine visit or sooner if needed What to bring: All of your medications  Thank you for your visit to [facility] internal medicine clinic today.  We will increase her diabetes medication known as Synjardy to better control your blood glucose levels.   If you have any questions or concerns please feel free to contact us at any time.

## 2018-02-28 NOTE — Assessment & Plan Note (Signed)
  HTN: Stage one hypertension level today at 138/68. I will check her BMP and possibly up-titrate her spironolactone if tolerated.  Continue lisinopril 40 mg daily Continue Metoprolol ER 100 mg daily (50 mg x 2) Continue Lasix 80 mg BID Continue Spironolactone 12.5 mg daily

## 2018-02-28 NOTE — Assessment & Plan Note (Signed)
DMII: Last HgbA1c 10.2 on 12/13/2017 and prior to that 9.4 on 06/21/2017. Glipizide was stopped at her last appointment due to concerns for hypoglycemia. An SGLT2 inhibitor, Empagliflozin, was instead initiated as a combined tablet with metformin.   Plan: Repeat A1c today--11.4 Continue current insulin regimen (Lantus 35 units QHS, Humalog 7 units with breakfast, 7 units with lunch, and 5 units with dinner).  Continue Synjardy (Empagliflozin-metformin) but increased to 07-999 mg daily  Return in three months for routine visit Foot exam performed. No visible ulceration or at risk skin noted

## 2018-02-28 NOTE — Assessment & Plan Note (Signed)
Potassium: BMP today--

## 2018-02-28 NOTE — Assessment & Plan Note (Signed)
  COPD: Denied dyspnea or acute pulmonary concerns. Saturating at 98% on room air following a prolonged walk to the office. No changes indicated  Plan: Continue advair 250-30mcg 1 puff BID Continue Spiriva 67mcg daily Continue Albuterol q6hr PRN

## 2018-03-01 ENCOUNTER — Encounter: Payer: Self-pay | Admitting: Internal Medicine

## 2018-03-01 LAB — BMP8+ANION GAP
Anion Gap: 19 mmol/L — ABNORMAL HIGH (ref 10.0–18.0)
BUN/Creatinine Ratio: 23 (ref 12–28)
BUN: 21 mg/dL (ref 8–27)
CO2: 24 mmol/L (ref 20–29)
Calcium: 9.4 mg/dL (ref 8.7–10.3)
Chloride: 99 mmol/L (ref 96–106)
Creatinine, Ser: 0.9 mg/dL (ref 0.57–1.00)
GFR calc Af Amer: 78 mL/min/{1.73_m2} (ref >59)
GFR calc non Af Amer: 68 mL/min/{1.73_m2} (ref >59)
Glucose: 222 mg/dL — ABNORMAL HIGH (ref 65–99)
Potassium: 4.2 mmol/L (ref 3.5–5.2)
Sodium: 142 mmol/L (ref 134–144)

## 2018-03-01 NOTE — Progress Notes (Signed)
Patient was seen today in a co-visit between the physician and pharmacist.  See documentation under Dr. Nelma Rothman visit for details.

## 2018-03-01 NOTE — Progress Notes (Signed)
Medicine attending: Medical history, presenting problems, physical findings, and medications, reviewed with resident physician Dr Lawrence Harbrecht on the day of the patient visit and I concur with his evaluation and management plan. 

## 2018-03-02 ENCOUNTER — Other Ambulatory Visit: Payer: Self-pay

## 2018-03-02 NOTE — Telephone Encounter (Addendum)
Return call made to patient-states she was unable to pick up her synjardy 12.5-500 mg tabs.  Pt states the pharmacy has two different doses on file, but she was informed of a dose change during last visit.  Per EMR, pt should was increased from synjardy 5-500mg   daily to synjardy 12.5-500 mg daily.  Will call pharmacy to confirm dose.Despina Hidden Cassady5/31/20192:57 PM   Pharmacy has already spoken with triage nurse and confirmed the correct dose

## 2018-03-02 NOTE — Telephone Encounter (Signed)
Hi, spoke w/ pharmacy, read your note, from note instructed pharmacy to give pt 07/999 synjardy or as the pharmacist stated 5/500 twice daily. If this is not correct please advise

## 2018-03-02 NOTE — Telephone Encounter (Signed)
Pt states she is unable to get the diabetes med. Pt would like the nurse to call the pharmacy.

## 2018-03-07 NOTE — Telephone Encounter (Signed)
Thank you. This is correct. I apologize as it appears that I sent in a script that needed to be altered. I failed to call and cancel the first script.  [name]

## 2018-03-15 ENCOUNTER — Other Ambulatory Visit: Payer: Self-pay | Admitting: Internal Medicine

## 2018-03-15 DIAGNOSIS — I10 Essential (primary) hypertension: Secondary | ICD-10-CM

## 2018-03-15 NOTE — Telephone Encounter (Signed)
Next appt scheduled 9/11 with PCP. 

## 2018-03-29 DIAGNOSIS — J449 Chronic obstructive pulmonary disease, unspecified: Secondary | ICD-10-CM | POA: Diagnosis not present

## 2018-04-07 ENCOUNTER — Emergency Department (HOSPITAL_COMMUNITY): Payer: Medicare Other

## 2018-04-07 ENCOUNTER — Encounter (HOSPITAL_COMMUNITY): Payer: Self-pay | Admitting: Emergency Medicine

## 2018-04-07 ENCOUNTER — Emergency Department (HOSPITAL_COMMUNITY)
Admission: EM | Admit: 2018-04-07 | Discharge: 2018-04-07 | Disposition: A | Discharge status: Home / Self Care | Payer: Medicare Other | Attending: Emergency Medicine | Admitting: Emergency Medicine

## 2018-04-07 ENCOUNTER — Other Ambulatory Visit: Payer: Self-pay

## 2018-04-07 DIAGNOSIS — Z7982 Long term (current) use of aspirin: Secondary | ICD-10-CM | POA: Diagnosis not present

## 2018-04-07 DIAGNOSIS — E119 Type 2 diabetes mellitus without complications: Secondary | ICD-10-CM | POA: Insufficient documentation

## 2018-04-07 DIAGNOSIS — S99912A Unspecified injury of left ankle, initial encounter: Secondary | ICD-10-CM | POA: Diagnosis not present

## 2018-04-07 DIAGNOSIS — M25572 Pain in left ankle and joints of left foot: Secondary | ICD-10-CM | POA: Diagnosis not present

## 2018-04-07 DIAGNOSIS — I110 Hypertensive heart disease with heart failure: Secondary | ICD-10-CM | POA: Insufficient documentation

## 2018-04-07 DIAGNOSIS — J449 Chronic obstructive pulmonary disease, unspecified: Secondary | ICD-10-CM | POA: Insufficient documentation

## 2018-04-07 DIAGNOSIS — L03116 Cellulitis of left lower limb: Secondary | ICD-10-CM | POA: Insufficient documentation

## 2018-04-07 DIAGNOSIS — Z794 Long term (current) use of insulin: Secondary | ICD-10-CM | POA: Diagnosis not present

## 2018-04-07 DIAGNOSIS — M7989 Other specified soft tissue disorders: Secondary | ICD-10-CM | POA: Diagnosis not present

## 2018-04-07 DIAGNOSIS — M7732 Calcaneal spur, left foot: Secondary | ICD-10-CM | POA: Diagnosis not present

## 2018-04-07 DIAGNOSIS — L0390 Cellulitis, unspecified: Secondary | ICD-10-CM

## 2018-04-07 DIAGNOSIS — I5042 Chronic combined systolic (congestive) and diastolic (congestive) heart failure: Secondary | ICD-10-CM | POA: Diagnosis not present

## 2018-04-07 DIAGNOSIS — F17210 Nicotine dependence, cigarettes, uncomplicated: Secondary | ICD-10-CM | POA: Insufficient documentation

## 2018-04-07 DIAGNOSIS — Z79899 Other long term (current) drug therapy: Secondary | ICD-10-CM | POA: Insufficient documentation

## 2018-04-07 LAB — CBG MONITORING, ED: Glucose-Capillary: 307 mg/dL — ABNORMAL HIGH (ref 70–99)

## 2018-04-07 MED ORDER — DOXYCYCLINE HYCLATE 100 MG PO CAPS: 100.0000 mg | ORAL_CAPSULE | Freq: Two times a day (BID) | ORAL | 0 refills | Status: AC

## 2018-04-07 NOTE — Discharge Instructions (Addendum)
You were given a prescription for antibiotics. Please take the antibiotic prescription fully.   I have prescribed a new medication for you today. It is important that when you pick the prescription up you discuss the potential interactions of this medication with other medications you are taking, including over the counter medications, with the pharmacists.   This new medication has potential side effects. Be sure to contact your primary care provider or return to the emergency department if you are experiencing new symptoms that you are unable to tolerate after starting the medication. You need to receive medical evaluation immediately if you start to experience blistering of the skin, rash, swelling, or difficulty breathing as these signs could indicate a more serious medication side effect.   Please follow up with your primary care provider within 2-3 days for re-evaluation of your symptoms.  Please return to the emergency room immediately if you experience any new or worsening symptoms or any symptoms that indicate worsening infection such as fevers, increased redness/swelling/pain, warmth, or drainage from the affected area.

## 2018-04-07 NOTE — ED Notes (Signed)
Due to physical appearance and need for further labs for patient, will upgrade acuity

## 2018-04-07 NOTE — ED Notes (Signed)
Pt back from radiology 

## 2018-04-07 NOTE — ED Notes (Signed)
PA at bedside.

## 2018-04-07 NOTE — ED Provider Notes (Signed)
[facility] EMERGENCY DEPARTMENT Provider Note   CSN: [id number] Arrival date & time: 04/07/18  2044     History   Chief Complaint Chief Complaint  Patient presents with  . Foot Injury    HPI [name] is a 67 y.o. female.  HPI  Patient is 67 year old female with history of HTN, defibrillator, CHF, hypertension, hyperlipidemia, hypothyroidism who presents emergency department today for evaluation of left ankle and foot redness, swelling and pain.  Patient notes that she has had swelling and pain to the area for the last week.  She just noticed the redness today.  Symptoms have been constant and worsening since onset.  Denies any exacerbating or alleviating factors.  She states that several days ago she stepped off of a curb and "landed wrong".  She thinks that she might have turned her ankle however her pain was not significantly changed after this injury.  Denies any other injuries after stepping off the curb wrong.  Did not hit her head or lose consciousness.  Patient denies any fevers.  Denies any abrasions or breaks in the skin to the area of concern.  She states that she has somewhat decreased motion however she is able to move her foot up and down.  No numbness or tingling to the foot.  She has been ambulatory at home.  She states that her blood sugars are under great control at home and usually range from the 60s to 100.  Past Medical History:  Diagnosis Date  . ATN (acute tubular necrosis) ([place]) 07/15/2014  . Automatic implantable cardioverter-defibrillator in situ   . Cardiac defibrillator in situ    Atlas II VR (SJM) implanted by Dr Lovena Le  . CHF (congestive heart failure) ([place])   . Chronic combined systolic and diastolic heart failure (HCC)    a. EF 35-40% in past;  b. Echo 7/13:  EF 45-50%, Gr 2 diast dysfn, mild AI, mild MAC, trivial MR, mild LAE, PASP 47.  [name] Chronic ulcer of leg ([place])    04-09-15 resolved-not a problem.  . Colon polyps 04/12/2013   Rectosigmoid polyp  . COPD (chronic obstructive pulmonary disease) (HCC)    O2 at night  . Depression   . Diabetes mellitus   . Diabetes mellitus, type 2 ([place]) 04/03/2012   HIGH RISK FEET.. Please have patient take shoes and socks off every visit for visual foot inspection.    . Eczema   . Elevated alkaline phosphatase level    GGT and 5'nucleotidase 8/13 normal  . History of oxygen administration    oxygen @ 2 l/m nasally bedtime 24/7  . HTN (hypertension)   . Hx of cardiac cath    a. [place] 2003 normal;  b. LHC 6/13:  Mild calcification in the LM, o/w normal coronary arteries, EF 45%.   . Hyperlipidemia   . Hyperlipidemia associated with type 2 diabetes mellitus ([place]) 05/25/2007   Qualifier: Diagnosis of  By: Hassell Done FNP, Tori Milks    . Hyperthyroidism, subclinical   . Implantable cardioverter-defibrillator (ICD) generator end of life   . Lipoma   . NICM (nonischemic cardiomyopathy) ([place])   . Obesity   . On home oxygen therapy    "2L; 24/7" (10/11/2016)  . Post menopausal syndrome   . Sleep apnea    pt denies 04/12/2013    Patient Active Problem List   Diagnosis Date Noted  . Hyperkalemia 08/08/2017  . Shortness of breath 07/14/2017  . Chronic combined systolic and diastolic congestive heart  failure ([place]) 03/18/2016  . Nocturnal hypoxia 02/24/2016  . Spinal stenosis of lumbar region 12/09/2015  . COPD (chronic obstructive pulmonary disease) ([place]) 06/24/2015  . Diabetic gastroparesis associated with type 2 diabetes mellitus ([place]) 07/12/2014  . Depression 05/22/2014  . Health care maintenance 01/22/2013  . Type 2 diabetes mellitus with diabetic polyneuropathy, with long-term current use of insulin ([place]) 04/03/2012  . HYPERTHYROIDISM, SUBCLINICAL 05/06/2009  . Essential hypertension 05/25/2007  . Automatic implantable cardioverter-defibrillator in situ 05/04/2007    Past Surgical History:  Procedure Laterality Date  . ABDOMINAL HYSTERECTOMY    . CARDIAC CATHETERIZATION    .  CARDIAC CATHETERIZATION N/A 06/21/2016   Procedure: Left Heart Cath and Coronary Angiography;  Surgeon: Sherren Mocha, MD;  Location: [facility];  Service: Cardiovascular;  Laterality: N/A;  . CARDIAC DEFIBRILLATOR PLACEMENT  05/04/2007   SJM Atlas II VR ICD  . CARDIAC DEFIBRILLATOR PLACEMENT    . COLONOSCOPY N/A 04/12/2013   Procedure: COLONOSCOPY;  Surgeon: Beryle Beams, MD;  Location: [facility];  Service: Endoscopy;  Laterality: N/A;  pt.has defibrilator  . COLONOSCOPY N/A 04/16/2015   Procedure: COLONOSCOPY;  Surgeon: [name], MD;  Location: [facility]OSCOPY;  Service: Endoscopy;  Laterality: N/A;  . ESOPHAGOGASTRODUODENOSCOPY N/A 04/16/2015   Procedure: ESOPHAGOGASTRODUODENOSCOPY (EGD);  Surgeon: [name], MD;  Location: [facility];  Service: Endoscopy;  Laterality: N/A;  . HERNIA REPAIR    . HYSTEROSCOPY    . IMPLANTABLE CARDIOVERTER DEFIBRILLATOR (ICD) GENERATOR CHANGE N/A 04/02/2014   Procedure: ICD GENERATOR CHANGE;  Surgeon: Evans Lance, MD;  Location: [facility] CATH LAB;  Service: Cardiovascular;  Laterality: N/A;  . INSERT / REPLACE / REMOVE PACEMAKER    . LEFT HEART CATHETERIZATION WITH CORONARY ANGIOGRAM N/A 04/02/2012   Procedure: LEFT HEART CATHETERIZATION WITH CORONARY ANGIOGRAM;  Surgeon: Hillary Bow, MD;  Location: [facility] CATH LAB;  Service: Cardiovascular;  Laterality: N/A;  . TUBAL LIGATION       OB History   None      Home Medications    Prior to Admission medications   Medication Sig Start Date End Date Taking? Authorizing Provider  albuterol (PROVENTIL) (2.5 MG/3ML) 0.083% nebulizer solution USE 1 VIAL VIA NEBULIZER EVERY 6 HOURS AS NEEDED FOR WHEEZING OR SHORTNESS OF BREATH 11/22/16   Burns, Arloa Koh, MD  aspirin EC 81 MG tablet Take 1 tablet (81 mg total) by mouth daily. 11/13/15   Burns, Arloa Koh, MD  atorvastatin (LIPITOR) 80 MG tablet Take 1 tablet (80 mg total) by mouth at bedtime. 06/21/17   Kathi Ludwig, MD  betamethasone  dipropionate (DIPROLENE) 0.05 % cream Apply topically 2 (two) times daily. 12/13/17   Kathi Ludwig, MD  diphenhydrAMINE (BENADRYL) 25 mg capsule Take 1 capsule (25 mg total) by mouth every 6 (six) hours as needed. 05/17/17 05/17/18  Shela Leff, MD  doxycycline (VIBRAMYCIN) 100 MG capsule Take 1 capsule (100 mg total) by mouth 2 (two) times daily for 7 days. 04/07/18 04/14/18  Jissel Slavens S, PA-C  Empagliflozin-metFORMIN HCl (SYNJARDY) 12.5-500 MG TABS Take 1 tablet by mouth daily. With a meal 02/28/18   Kathi Ludwig, MD  Fluticasone-Salmeterol (ADVAIR DISKUS) 250-50 MCG/DOSE AEPB Inhale 1 puff into the lungs 2 (two) times daily. 05/31/17 05/31/18  Shela Leff, MD  furosemide (LASIX) 80 MG tablet Take 1 tablet (80 mg total) by mouth 2 (two) times daily. 01/12/18 07/11/18  Velna Ochs, MD  Insulin Glargine (LANTUS) 100 UNIT/ML Solostar Pen Inject 35 Units into the skin daily.  06/21/17   Katherine Roan, MD  insulin lispro (HUMALOG KWIKPEN) 100 UNIT/ML KiwkPen Take 7 units with breakfast, 7 units with lunch, and 5 units with dinner. 06/21/17   Katherine Roan, MD  lisinopril (PRINIVIL,ZESTRIL) 40 MG tablet Take 1 tablet (40 mg total) by mouth daily. 01/12/18 05/12/18  Velna Ochs, MD  metoprolol succinate (TOPROL-XL) 50 MG 24 hr tablet Take 2 tablets (100 mg total) by mouth daily. 01/12/18   Velna Ochs, MD  nitroGLYCERIN (NITROSTAT) 0.4 MG SL tablet Place 0.4 mg under the tongue every 5 (five) minutes as needed for chest pain. Reported on 03/18/2016    [provider]  pantoprazole (PROTONIX) 40 MG tablet Take 1 tablet (40 mg total) by mouth daily. 02/08/17   Burns, Arloa Koh, MD  spironolactone (ALDACTONE) 25 MG tablet TAKE 0.5 TABLETS BY MOUTH EVERY DAY 03/15/18   Kathi Ludwig, MD  tiotropium (SPIRIVA) 18 MCG inhalation capsule Place 1 capsule (18 mcg total) into inhaler and inhale daily. 06/21/17   Kathi Ludwig, MD    Family History Family  History  Problem Relation Age of Onset  . Stroke Mother   . Seizures Father   . Heart disease Father   . Diabetes Sister   . Asthma Maternal Aunt        aunts  . Asthma Maternal Uncle        uncles  . Heart disease Paternal Aunt        aunts  . Heart disease Paternal Uncle        uncles  . Heart disease Maternal Aunt        aunts  . Heart disease Maternal Uncle        uncles  . Heart disease Maternal Grandfather   . Colon cancer Neg Hx   . Colon polyps Neg Hx   . Esophageal cancer Neg Hx   . Kidney disease Neg Hx   . Gallbladder disease Neg Hx     Social History Social History   Tobacco Use  . Smoking status: Current Every Day Smoker    Packs/day: 0.30    Years: 39.00    Pack years: 11.70    Types: Cigarettes  . Smokeless tobacco: Never Used  . Tobacco comment: 1 cig/day.  Substance Use Topics  . Alcohol use: No    Alcohol/week: 0.6 oz    Types: 1 Glasses of wine per week  . Drug use: No     Allergies   Actos [pioglitazone]; Naproxen; Rosiglitazone; and Hydrocortisone   Review of Systems Review of Systems  Constitutional: Negative for fever.  Musculoskeletal:       Left ankle pain, redness and swelling.  Skin: Negative for wound.  Neurological: Negative for weakness and numbness.     Physical Exam Updated Vital Signs BP (!) 176/93 (BP Location: Right Arm)   Pulse 78   Temp 98.6 F (37 C) (Oral)   Resp 20   SpO2 95%   Physical Exam  Constitutional: She appears well-developed and well-nourished. No distress.  HENT:  Head: Normocephalic and atraumatic.  Eyes: Conjunctivae are normal.  Neck: Neck supple.  Cardiovascular: Normal rate.  Pulmonary/Chest: Effort normal.  Musculoskeletal:  Patient has swelling and tenderness to the medial aspect of the left lower extremity and left foot.  The area is warm to touch.  Appears consistent with cellulitis.  No significant swelling around the ankle joint.  Patient has intact plantarflexion and  dorsiflexion of the bilateral ankles.  Range of motion is  only slightly decreased on the left, likely due to swelling.  Patient is ambulatory in the ED with steady gait.  No obvious abrasions, open skin, or fluid collection.  Neurological: She is alert.  Skin: Skin is warm and dry. Capillary refill takes less than 2 seconds.  Psychiatric: She has a normal mood and affect.  Nursing note and vitals reviewed.      ED Treatments / Results  Labs (all labs ordered are listed, but only abnormal results are displayed) Labs Reviewed  CBG MONITORING, ED - Abnormal; Notable for the following components:      Result Value   Glucose-Capillary 307 (*)    All other components within normal limits    EKG None  Radiology Dg Ankle Complete Left  Result Date: 04/07/2018 CLINICAL DATA:  Fall and swelling EXAM: LEFT ANKLE COMPLETE - 3+ VIEW COMPARISON:  None. FINDINGS: No fracture or malalignment. Ankle mortise is symmetric. Soft tissue swelling. Moderate plantar calcaneal spur IMPRESSION: No acute osseous abnormality Electronically Signed   By: Donavan Foil M.D.   On: 04/07/2018 21:21   Dg Foot Complete Left  Result Date: 04/07/2018 CLINICAL DATA:  Fall with pain and swelling EXAM: LEFT FOOT - COMPLETE 3+ VIEW COMPARISON:  None. FINDINGS: Diffuse soft tissue swelling. No acute displaced fracture or malalignment. Moderate plantar calcaneal spur. IMPRESSION: No acute osseous abnormality Electronically Signed   By: Donavan Foil M.D.   On: 04/07/2018 21:23    Procedures Procedures (including critical care time)  Medications Ordered in ED Medications - No data to display   Initial Impression / Assessment and Plan / ED Course  I have reviewed the triage vital signs and the nursing notes.  Pertinent labs & imaging results that were available during my care of the patient were reviewed by me and considered in my medical decision making (see chart for details).   Final Clinical Impressions(s) / ED  Diagnoses   Final diagnoses:  Cellulitis, unspecified cellulitis site   Patient presenting with left foot and ankle pain, redness and swelling.  She is afebrile.  He is mildly hypertensive but otherwise her vital signs are normal.  On exam she has redness, swelling and tenderness to the medial aspect of the left ankle and foot.   Given her history of recent fall x-rays of the left foot and ankle were obtained and were negative for acute fracture abnormality.  Of note, there was no joint effusion noted on the left ankle x-ray.  Patient is able to range her left ankle, doubt septic arthritis.  Exam and history are most consistent with cellulitis.  Border was drawn around the area of concern.  She will be given a prescription for antibiotics and told to follow-up with her primary care doctor in 2 to 3 days for reevaluation of her cellulitis.  Advised to return to the ER sooner if the area of redness tracks past the border or starts to streak up the leg.  Patient CBG was 307 in the ED.  No signs or symptoms to suggest DKA.  Discussed taking her regular diabetes medications and watching her sugars closely.  Patient and her son at bedside voice an understanding of the plan for follow-up and reasons to return immediately to the ED.  All questions were answered.  ED Discharge Orders        Ordered    doxycycline (VIBRAMYCIN) 100 MG capsule  2 times daily     04/07/18 2325       Tylerjames Hoglund  S, PA-C 04/08/18 0043    Nat Christen, MD 04/08/18 1840

## 2018-04-07 NOTE — ED Triage Notes (Addendum)
Pt states she stepped off of the porch wrong one day last week. Swelling noted to left foot. Tender to walk.  Has not tried heat, ice, or any OTC medications at home.

## 2018-04-11 DIAGNOSIS — I10 Essential (primary) hypertension: Secondary | ICD-10-CM | POA: Diagnosis not present

## 2018-04-11 DIAGNOSIS — I5022 Chronic systolic (congestive) heart failure: Secondary | ICD-10-CM | POA: Diagnosis not present

## 2018-04-11 DIAGNOSIS — K219 Gastro-esophageal reflux disease without esophagitis: Secondary | ICD-10-CM | POA: Diagnosis not present

## 2018-04-11 DIAGNOSIS — Z800 Family history of malignant neoplasm of digestive organs: Secondary | ICD-10-CM | POA: Diagnosis not present

## 2018-04-13 ENCOUNTER — Encounter (INDEPENDENT_AMBULATORY_CARE_PROVIDER_SITE_OTHER): Payer: Self-pay

## 2018-04-13 ENCOUNTER — Telehealth: Payer: Self-pay | Admitting: Internal Medicine

## 2018-04-13 ENCOUNTER — Other Ambulatory Visit: Payer: Self-pay

## 2018-04-13 ENCOUNTER — Ambulatory Visit (INDEPENDENT_AMBULATORY_CARE_PROVIDER_SITE_OTHER): Payer: Medicare Other | Admitting: Internal Medicine

## 2018-04-13 VITALS — BP 152/81 | HR 93 | Temp 98.8°F | Ht 60.0 in | Wt 214.5 lb

## 2018-04-13 DIAGNOSIS — R21 Rash and other nonspecific skin eruption: Secondary | ICD-10-CM | POA: Diagnosis not present

## 2018-04-13 DIAGNOSIS — E11628 Type 2 diabetes mellitus with other skin complications: Secondary | ICD-10-CM

## 2018-04-13 DIAGNOSIS — L03119 Cellulitis of unspecified part of limb: Principal | ICD-10-CM

## 2018-04-13 LAB — D-DIMER, QUANTITATIVE: D-Dimer, Quant: 0.66 ug/mL-FEU — ABNORMAL HIGH (ref 0.00–0.50)

## 2018-04-13 MED ORDER — BETAMETHASONE DIPROPIONATE 0.05 % EX CREA: TOPICAL_CREAM | Freq: Two times a day (BID) | CUTANEOUS | 0 refills | Status: DC

## 2018-04-13 NOTE — Telephone Encounter (Signed)
Called pt - mailbox full; unable to leave message.

## 2018-04-13 NOTE — Telephone Encounter (Signed)
Pt was has just here she called the pharmacy checking on cream for hand/foot rash, physician said she was sending to the pharmacy, pls send to [facility] pt contact# [phone]

## 2018-04-13 NOTE — Assessment & Plan Note (Signed)
Stage 2 hypertension today at 152/81. Patient reports taking lisinopril 40mg , metoprolol 100mg , Lasix 80mg  BID, Spironolactone 12.5mg  daily. Denies headache, blurry vision.   Plan: - f/u with PCP on 9/11

## 2018-04-13 NOTE — Assessment & Plan Note (Signed)
History of puritic rash on palms and soles dating back to 2014 per chart review. Negative RPR in 2017. Patient states the rash comes and goes and at least once/year gets very itchy. Her mother had the same thing. Uses beclomethasone 0.5% cream which provides some relief but is becoming less effective. Derm referral was placed last year but patient states they wouldn't accept her insurance.  Plan: - derm referral - continue beclomethasone

## 2018-04-13 NOTE — Assessment & Plan Note (Addendum)
64yoF with uncontrolled type 2 DM (last a1c 11.4), HTN, CHF, HLD, current smoker with cellulitis of left foot/ankle. Treated with Doxycyline 100mg  BID for 7 days. Area of cellulitis has improved. Some swelling and tenderness remain. Redness has resolved. Wells score of 2. No ultrasound done in the ED.   Plan: - d-dimer - counseled on smoking cessation, glucose control, and proper foot care - verified compliance with empagliflozin-metformin  and insulin. Sugars range 74-203 - has PCP appt 06/13/18

## 2018-04-13 NOTE — Telephone Encounter (Signed)
Per the note they did not want to give her another cream but referred her to derm and told her to continue with her current cream

## 2018-04-13 NOTE — Progress Notes (Signed)
   CC: cellulitis   HPI:  [name] is a 67 y.o. female with PMH of uncontrolledType 2 DM, HTN, COPD, defibrillator, CHF, HTN, HLD, hypothyroidism who presents for follow up of cellulitis of left ankle and foot after ED visit on 7/6. Symptoms began after stepping of a curb and "landing wrong." X-rays in the ED were negative and the 1 week h/o redness, swelling, and tenderness was diagnosed as cellulitis. She was given a prescription for Doxycycline 137m BID for 7 days and reports compliance.   Denies fevers, chills, diarrhea  Past Medical History:  Diagnosis Date  . ATN ([place] tubular necrosis) ([place] 07/15/2014  . Automatic implantable cardioverter-defibrillator in situ   . Cardiac defibrillator in situ    Atlas II VR (SJM) implanted by Dr TLovena Le . CHF (congestive heart failure) ([place]   . Chronic combined systolic and diastolic heart failure (HCC)    a. EF 35-40% in past;  b. Echo 7/13:  EF 45-50%, Gr 2 diast dysfn, mild AI, mild MAC, trivial MR, mild LAE, PASP 47.  .Marland KitchenChronic ulcer of leg ([place]    04-09-15 resolved-not a problem.  . Colon polyps 04/12/2013   Rectosigmoid polyp  . COPD (chronic obstructive pulmonary disease) (HCC)    O2 at night  . Depression   . Diabetes mellitus   . Diabetes mellitus, type 2 ([place] 04/03/2012   HIGH RISK FEET.. Please have patient take shoes and socks off every visit for visual foot inspection.    . Eczema   . Elevated alkaline phosphatase level    GGT and 5'nucleotidase 8/13 normal  . History of oxygen administration    oxygen @ 2 l/m nasally bedtime 24/7  . HTN (hypertension)   . Hx of cardiac cath    a. [place] normal;  b. LHC 6/13:  Mild calcification in the LM, o/w normal coronary arteries, EF 45%.   . Hyperlipidemia   . Hyperlipidemia associated with type 2 diabetes mellitus ([place] 05/25/2007   Qualifier: Diagnosis of  By: MHassell DoneFNP, NTori Milks   . Hyperthyroidism, subclinical   . Implantable cardioverter-defibrillator (ICD) generator  end of life   . Lipoma   . NICM (nonischemic cardiomyopathy) ([place]   . Obesity   . On home oxygen therapy    "2L; 24/7" (10/11/2016)  . Post menopausal syndrome   . Sleep apnea    pt denies 04/12/2013   Physical Exam:  Vitals:   04/13/18 0927  BP: (!) 152/81  Pulse: 93  Temp: 98.8 F (37.1 C)  TempSrc: Oral  SpO2: 98%  Weight: 214 lb 8 oz (97.3 kg)  Height: 5' (1.524 m)   Gen: Well appearing, NAD Ext: Left leg more swollen than right. No erythema. Slight warmth over left foot. Tenderness to palpation of left ankle/foot. Pulses symmetric bilaterally. Skin: Brown, scaly, pruritic rash on soles and palms                                     Assessment & Plan:   See Encounters Tab for problem based charting.  Patient seen with Dr. RRebeca Alert

## 2018-04-13 NOTE — Patient Instructions (Addendum)
It was nice seeing you today. Thank you for choosing [facility] for your Primary Care.   Today we talked about:  1) Cellulitis of foot/ankle: Glad things are getting better. Please finish your doxycyline. Since there is still some swelling in that leg, we will get a blood test that looks for blood clots. I will call you if this result comes back abnormal. Please let us know if you have any other concerns about your leg 2) Rash on palms and soles: We have placed another dermatology referral. Please follow up with them. I don't want to give you any stronger steroid cream before you see dermatology. Let's let them handle this.   FOLLOW-UP INSTRUCTIONS When: 9/11 at 1:15pm with your primary doctor, or sooner if needed For: chronic medical problems What to bring: all medications  Please contact the clinic if you have any problems, or need to be seen sooner.

## 2018-04-14 LAB — RPR: RPR Ser Ql: NONREACTIVE

## 2018-04-16 NOTE — Telephone Encounter (Signed)
Patient is returning a call from Friday, pt said if you call her back after 12 she is will be going to a funeral, pt contact# [phone]

## 2018-04-16 NOTE — Progress Notes (Signed)
Internal Medicine Clinic Attending  I saw and evaluated the patient.  I personally confirmed the key portions of the history and exam documented by Dr. Donne Hazel and I reviewed pertinent patient test results.  The assessment, diagnosis, and plan were formulated together and I agree with the documentation in the resident's note.  Here for ED follow up after visit for left leg redness and swelling, treated with doxycycline for presumed cellulitis. Improving significantly, but leg is still noticeably more swollen than right leg, tender to palpation in lower leg and ankle. As noted by Dr. Donne Hazel, [place] Nephew score for DVT 2, obtained D-dimer which is elevated, will proceed to [place] for DVT.   Also discussed her recurrent rash today. Widely-scattered hyperpigmented papules and plaques with superficial scale, very pruritic. Rash includes palms (see pictures). RPR again negative (important to rule out as treatable cause with significant morbidity and transmissibility). Lichen planus vs eczema most likely differential diagnosis. Responds somewhat to topical high-potency steroids. Will try again to get her evaluated by dermatology.  Lenice Pressman, M.D., Ph.D.

## 2018-04-17 ENCOUNTER — Telehealth: Payer: Self-pay | Admitting: Internal Medicine

## 2018-04-17 NOTE — Telephone Encounter (Signed)
This does not appear to be irritant contact dermatitis or atopic dermatitis given the hyperpigmented precursor lesions and current images. It is not responding to topical steroids I feel that the next step would be to consider a skin biopsy in our clinic which I could do in September or she could have done in [facility] sooner. She would need to have this coordinated prior to the appointment so that they know in advance and can plan accordingly with attending availability. Please keep me updated as to her decision.   Kathi Ludwig, MD Internal Medicine PGY-2 Pager # [phone]

## 2018-04-17 NOTE — Telephone Encounter (Signed)
Pt is returning call, pt is home, pt contact # [phone]

## 2018-04-17 NOTE — Telephone Encounter (Signed)
Pt stated she went to dermatologist's office but was told they do not take her insurance which she told the doctor at Friday's appt.. Also stated she's waiting on ultrasound appt - which [place] is aware and willcall pt back.

## 2018-04-17 NOTE — Telephone Encounter (Signed)
See telephone encounter from 7/12

## 2018-04-18 ENCOUNTER — Ambulatory Visit (HOSPITAL_COMMUNITY)
Admission: RE | Admit: 2018-04-18 | Discharge: 2018-04-18 | Disposition: A | Discharge status: Home / Self Care | Payer: Medicare Other | Source: Ambulatory Visit | Attending: Internal Medicine | Admitting: Internal Medicine

## 2018-04-18 DIAGNOSIS — E11628 Type 2 diabetes mellitus with other skin complications: Secondary | ICD-10-CM | POA: Diagnosis not present

## 2018-04-18 DIAGNOSIS — L03119 Cellulitis of unspecified part of limb: Secondary | ICD-10-CM | POA: Diagnosis not present

## 2018-04-18 NOTE — Progress Notes (Signed)
*  Preliminary Results* Left lower extremity venous duplex completed. Left lower extremity is negative for deep vein thrombosis. There is no evidence of left Baker's cyst.  04/18/2018 1:47 PM  Cailah Reach Dawna Part

## 2018-04-26 ENCOUNTER — Telehealth: Payer: Self-pay | Admitting: Internal Medicine

## 2018-04-26 NOTE — Telephone Encounter (Signed)
Pt calling checking on lab results; pt contact [phone]

## 2018-04-28 DIAGNOSIS — J449 Chronic obstructive pulmonary disease, unspecified: Secondary | ICD-10-CM | POA: Diagnosis not present

## 2018-05-02 NOTE — Telephone Encounter (Signed)
I called and left voicemail. I assume she's wondering about her recent leg ultrasound to look for a dvt which came back negative for dvt.

## 2018-05-10 NOTE — Telephone Encounter (Signed)
[place] appt completed on 04/18/2018.

## 2018-05-21 ENCOUNTER — Ambulatory Visit (INDEPENDENT_AMBULATORY_CARE_PROVIDER_SITE_OTHER): Payer: Medicare Other | Admitting: *Deleted

## 2018-05-21 DIAGNOSIS — I5042 Chronic combined systolic (congestive) and diastolic (congestive) heart failure: Secondary | ICD-10-CM | POA: Diagnosis not present

## 2018-05-21 LAB — CUP PACEART INCLINIC DEVICE CHECK
Battery Remaining Longevity: 68 mo
Brady Statistic RV Percent Paced: 0 %
Date Time Interrogation Session: 20190819105516
HighPow Impedance: 49.7769
Implantable Lead Implant Date: 20080801
Implantable Lead Location: 753860
Implantable Lead Model: 7120
Implantable Pulse Generator Implant Date: 20150701
Lead Channel Impedance Value: 437.5 Ohm
Lead Channel Pacing Threshold Amplitude: 0.75 V
Lead Channel Pacing Threshold Pulse Width: 0.5 ms
Lead Channel Sensing Intrinsic Amplitude: 11.9 mV
Lead Channel Setting Pacing Amplitude: 2.5 V
Lead Channel Setting Pacing Pulse Width: 0.5 ms
Lead Channel Setting Sensing Sensitivity: 0.5 mV
Pulse Gen Serial Number: 7206538

## 2018-05-21 NOTE — Progress Notes (Signed)
ICD check in clinic. Normal device function. Threshold and sensing consistent with previous device measurements. Impedance trends stable over time. No evidence of any ventricular arrhythmias. Histogram distribution appropriate for patient and level of activity. CorVue stable, but possible fluid accumulation May 27th X 8 days and June 13 x 12 days. No changes made this session. Device programmed at appropriate safety margins. Device programmed to optimize intrinsic conduction. Estimated longevity 5.7 years. Patient education completed including shock plan. ROV with Device Clinic 08/20/18, ROV with [facility] in 11/2018

## 2018-05-29 DIAGNOSIS — J449 Chronic obstructive pulmonary disease, unspecified: Secondary | ICD-10-CM | POA: Diagnosis not present

## 2018-06-13 ENCOUNTER — Other Ambulatory Visit: Payer: Self-pay

## 2018-06-13 ENCOUNTER — Encounter: Payer: Self-pay | Admitting: Internal Medicine

## 2018-06-13 ENCOUNTER — Ambulatory Visit (INDEPENDENT_AMBULATORY_CARE_PROVIDER_SITE_OTHER): Payer: Medicare Other | Admitting: Internal Medicine

## 2018-06-13 VITALS — BP 138/77 | HR 79 | Temp 97.6°F | Ht 61.0 in | Wt 214.9 lb

## 2018-06-13 DIAGNOSIS — R21 Rash and other nonspecific skin eruption: Secondary | ICD-10-CM

## 2018-06-13 DIAGNOSIS — E1143 Type 2 diabetes mellitus with diabetic autonomic (poly)neuropathy: Secondary | ICD-10-CM

## 2018-06-13 DIAGNOSIS — E1142 Type 2 diabetes mellitus with diabetic polyneuropathy: Secondary | ICD-10-CM

## 2018-06-13 DIAGNOSIS — Z79899 Other long term (current) drug therapy: Secondary | ICD-10-CM

## 2018-06-13 DIAGNOSIS — E118 Type 2 diabetes mellitus with unspecified complications: Secondary | ICD-10-CM

## 2018-06-13 DIAGNOSIS — Z23 Encounter for immunization: Secondary | ICD-10-CM | POA: Insufficient documentation

## 2018-06-13 DIAGNOSIS — Z794 Long term (current) use of insulin: Secondary | ICD-10-CM | POA: Diagnosis not present

## 2018-06-13 DIAGNOSIS — K3184 Gastroparesis: Secondary | ICD-10-CM

## 2018-06-13 DIAGNOSIS — I10 Essential (primary) hypertension: Secondary | ICD-10-CM

## 2018-06-13 LAB — GLUCOSE, CAPILLARY: Glucose-Capillary: 257 mg/dL — ABNORMAL HIGH (ref 70–99)

## 2018-06-13 LAB — POCT GLYCOSYLATED HEMOGLOBIN (HGB A1C): Hemoglobin A1C: 12.7 % — AB (ref 4.0–5.6)

## 2018-06-13 MED ORDER — BETAMETHASONE DIPROPIONATE 0.05 % EX CREA: TOPICAL_CREAM | Freq: Two times a day (BID) | CUTANEOUS | 0 refills | Status: DC

## 2018-06-13 MED ORDER — INSULIN LISPRO 100 UNIT/ML (KWIKPEN): 8.0000 [IU] | PEN_INJECTOR | Freq: Three times a day (TID) | SUBCUTANEOUS | 3 refills | Status: DC

## 2018-06-13 MED ORDER — INSULIN GLARGINE 100 UNIT/ML SOLOSTAR PEN: 33.0000 [IU] | PEN_INJECTOR | Freq: Every day | SUBCUTANEOUS | 11 refills | Status: DC

## 2018-06-13 NOTE — Assessment & Plan Note (Signed)
Rash, ?Psoriasis:  Notable on the palms and soles, hyperkeratinized nature, pealing, only painful with breaks in the underlying epidermis, more prominent/severe on the soles of the feet but also notable on the elbows, palms, and lower ankles. Patient has used betamethasone cream without notable benefit, although she denied utilizing this regularly. She attempted to see Dermatology but they would not accept her insurance. Patient denied known contact irritant, fever, chills, warmth or edema of the affected areas, headache, nausea, vomiting, joint pain, myalgias.   Plan: We will plan to have her continue the cream, to be applied directly after a warm shower to the affected areas. This should be followed by an emollient such as petroleum jelly while the skin is warm and soft from being washed.  She is to return in four weeks for a biopsy.

## 2018-06-13 NOTE — Progress Notes (Signed)
CC: Evaluation of her diabetes and rash  HPI:[name] is a 67 y.o. female who presents today for evaluation of her diabetes and rash. Please see individual problem based assessment and plan for details.   Rash, ?Psoriasis:  Notable on the palms and soles, hyperkeratinized nature, pealing, only painful with breaks in the underlying epidermis, more prominent/severe on the soles of the feet but also notable on the elbows, palms, and lower ankles. Patient has used betamethasone cream without notable benefit, although she denied utilizing this regularly. She attempted to see Dermatology but they would not accept her insurance. Patient denied known contact irritant, fever, chills, warmth or edema of the affected areas, headache, nausea, vomiting, joint pain, myalgias.   Plan: We will plan to have her continue the cream, to be applied directly after a warm shower to the affected areas. This should be followed by an emollient such as petroleum jelly while the skin is warm and soft from being washed.  She is to return in four weeks for a biopsy.   DMII: Poorly controlled. Patient without glucometer, does not record readings at home. She notes hypoglycemia in the morning upon waking that resolves with consumption of juice. Patient attested to willingness to participate in a CMG trial. I will forward this to Dr. Maudie Mercury to determine appropriateness. In addition, I have requested that she continue to take her medications as prescribed, continue to decrease the number of carbohydrates consumed daily, and upon obtaining her glucometer, check her glucose 4 times daily.   Minor change in insulin, Lantus 33U form 35U, Aspart 8TID from 7 TID.   HTN: Continue current medications, BP 138/77 today.  Lisinopril 55m daily Metoprolol 1085mLasix 8045mpironolactone 12.5mg77mily   Influenza vaccine given.   Past Medical History:  Diagnosis Date  . ATN (acute tubular necrosis) (HCC)[place]/13/2015  . Automatic  implantable cardioverter-defibrillator in situ   . Cardiac defibrillator in situ    Atlas II VR (SJM) implanted by Dr TaylLovena LeCHF (congestive heart failure) (HCC)[place]. Chronic combined systolic and diastolic heart failure (HCC)    a. EF 35-40% in past;  b. Echo 7/13:  EF 45-50%, Gr 2 diast dysfn, mild AI, mild MAC, trivial MR, mild LAE, PASP 47.  . ChMarland Kitchenonic ulcer of leg (HCC)[place] 04-09-15 resolved-not a problem.  . Colon polyps 04/12/2013   Rectosigmoid polyp  . COPD (chronic obstructive pulmonary disease) (HCC)    O2 at night  . Depression   . Diabetes mellitus   . Diabetes mellitus, type 2 (HCC)[place]2/2013   HIGH RISK FEET.. Please have patient take shoes and socks off every visit for visual foot inspection.    . Eczema   . Elevated alkaline phosphatase level    GGT and 5'nucleotidase 8/13 normal  . History of oxygen administration    oxygen @ 2 l/m nasally bedtime 24/7  . HTN (hypertension)   . Hx of cardiac cath    a. LHC [place]3 normal;  b. LHC 6/13:  Mild calcification in the LM, o/w normal coronary arteries, EF 45%.   . Hyperlipidemia   . Hyperlipidemia associated with type 2 diabetes mellitus (HCC)[place]22/2008   Qualifier: Diagnosis of  By: MartHassell Done, NykeTori Milks. Hyperthyroidism, subclinical   . Implantable cardioverter-defibrillator (ICD) generator end of life   . Lipoma   . NICM (nonischemic cardiomyopathy) (HCC)[place]. Obesity   . On home oxygen therapy    "  2L; 24/7" (10/11/2016)  . Post menopausal syndrome   . Sleep apnea    pt denies 04/12/2013   Review of Systems:  ROS negative except as per HPI.  Physical Exam:  Vitals:   06/13/18 1435  BP: 138/77  Pulse: 79  Temp: 97.6 F (36.4 C)  TempSrc: Oral  SpO2: 98%  Weight: 214 lb 14.4 oz (97.5 kg)  Height: 5' 1"  (1.549 m)   General: A/O x 4, in no acute distress Cardio: RRR, no mrg's [place]um: cta bilaterally  MSK: bilateral lower extremities nonedematous nontender.  Skin: See above evaluation   Assessment & Plan:    See Encounters Tab for problem based charting.  Patient seen with Dr. Beryle Beams

## 2018-06-13 NOTE — Assessment & Plan Note (Signed)
II: Poorly controlled. Patient without glucometer, does not record readings at home. She notes hypoglycemia in the morning upon waking that resolves with consumption of juice. Patient attested to willingness to participate in a CMG trial. I will forward this to Dr. Maudie Mercury to determine appropriateness. In addition, I have requested that she continue to take her medications as prescribed, continue to decrease the number of carbohydrates consumed daily, and upon obtaining her glucometer, check her glucose 4 times daily.   Minor change in insulin, Lantus 33U form 35U, Aspart 8TID from 7 TID.

## 2018-06-13 NOTE — Progress Notes (Signed)
Medicine attending: I personally interviewed and briefly examined this patient on the day of the patient visit and reviewed pertinent clinical ,laboratory data  with resident physician Dr. Kathi Ludwig and we discussed a management plan. Cyclic rash during warm weather, crusting, dry, palms & soles, areas of increased pigmentation, flat, hyperpigmented rash extensor surfaces of elbows. No improvement w steroid cream. No insurance coverage for dermatologist. We will have her come back to clinic for a punch biopsy.

## 2018-06-13 NOTE — Patient Instructions (Addendum)
FOLLOW-UP INSTRUCTIONS When: one month For: Evaluation of your rash and diabetes What to bring: All of your medications and your glucometer  Please stop at the front desk and schedule to see me in one month as I will be in the clinic all month. At that time, we will perform a biopsy of the rash.  In addition, please record your glucose readings four times daily with your glucometer as this date will be essential in assisting you with treating your diabetes.

## 2018-06-13 NOTE — Assessment & Plan Note (Signed)
  HTN: Continue current medications, BP 138/77 today.  Lisinopril 40mg  daily Metoprolol 100mg  Lasix 80mg  Spironolactone 12.5mg  daily

## 2018-06-14 ENCOUNTER — Encounter: Payer: Self-pay | Admitting: Internal Medicine

## 2018-06-14 NOTE — Progress Notes (Signed)
Medicine attending: Medical history, presenting problems, physical findings, and medications, reviewed with resident physician Dr Lawrence Harbrecht on the day of the patient visit and I concur with his evaluation and management plan. 

## 2018-06-21 ENCOUNTER — Ambulatory Visit (INDEPENDENT_AMBULATORY_CARE_PROVIDER_SITE_OTHER): Payer: Medicare Other | Admitting: Pharmacist

## 2018-06-21 DIAGNOSIS — E1142 Type 2 diabetes mellitus with diabetic polyneuropathy: Secondary | ICD-10-CM

## 2018-06-21 DIAGNOSIS — Z794 Long term (current) use of insulin: Secondary | ICD-10-CM | POA: Diagnosis not present

## 2018-06-21 DIAGNOSIS — Z713 Dietary counseling and surveillance: Secondary | ICD-10-CM

## 2018-06-21 MED ORDER — GLUCOSE BLOOD VI STRP: ORAL_STRIP | 1 refills | Status: DC

## 2018-06-21 NOTE — Patient Instructions (Signed)
Patient educated about medication as defined in this encounter and verbalized understanding by repeating back instructions provided.   

## 2018-06-21 NOTE — Progress Notes (Addendum)
S: [name] is a 67 y.o. female reports to clinical pharmacist appointment for continuous glucose monitoring per Dr. Berline Lopes referral.   Allergies  Allergen Reactions  . Actos [Pioglitazone] Other (See Comments)    REACTION: congestive heart failure   . Naproxen Other (See Comments)    558m dose made her sleep for two days  . Rosiglitazone Hives  . Hydrocortisone Other (See Comments)    unknown   Medication Sig  liraglutide (VICTOZA) 18 MG/3ML SOPN Inject 0.6 mg into the skin daily.  albuterol (PROVENTIL) (2.5 MG/3ML) 0.083% nebulizer solution USE 1 VIAL VIA NEBULIZER EVERY 6 HOURS AS NEEDED FOR WHEEZING OR SHORTNESS OF BREATH  aspirin EC 81 MG tablet Take 1 tablet (81 mg total) by mouth daily.  atorvastatin (LIPITOR) 80 MG tablet Take 1 tablet (80 mg total) by mouth at bedtime.  betamethasone dipropionate (DIPROLENE) 0.05 % cream Apply topically 2 (two) times daily.  diphenhydrAMINE (BENADRYL) 25 mg capsule Take 1 capsule (25 mg total) by mouth every 6 (six) hours as needed.  Empagliflozin-metFORMIN HCl (SYNJARDY) 12.5-500 MG TABS Take 1 tablet by mouth daily. With a meal  Fluticasone-Salmeterol (ADVAIR DISKUS) 250-50 MCG/DOSE AEPB Inhale 1 puff into the lungs 2 (two) times daily.  furosemide (LASIX) 80 MG tablet Take 1 tablet (80 mg total) by mouth 2 (two) times daily.  insulin lispro (HUMALOG KWIKPEN) 100 UNIT/ML KiwkPen Inject 0.08 mLs (8 Units total) into the skin 3 (three) times daily with meals.  lisinopril (PRINIVIL,ZESTRIL) 40 MG tablet Take 1 tablet (40 mg total) by mouth daily.  metoprolol succinate (TOPROL-XL) 50 MG 24 hr tablet Take 2 tablets (100 mg total) by mouth daily.  nitroGLYCERIN (NITROSTAT) 0.4 MG SL tablet Place 0.4 mg under the tongue every 5 (five) minutes as needed for chest pain. Reported on 03/18/2016  pantoprazole (PROTONIX) 40 MG tablet Take 1 tablet (40 mg total) by mouth daily.  spironolactone (ALDACTONE) 25 MG tablet TAKE 0.5 TABLETS BY MOUTH EVERY  DAY  tiotropium (SPIRIVA) 18 MCG inhalation capsule Place 1 capsule (18 mcg total) into inhaler and inhale daily.   Past Medical History:  Diagnosis Date  . AT[place] ([place] tubular necrosis) ([place] 07/[place]  . Automatic implantable cardioverter-defibrillator in situ   . Cardiac defibrillator in situ    Atlas II VR (SJM) implanted by Dr TLovena Le . CHF ([place]estive heart failure) ([place]   . Chronic combined systolic and diastolic heart failure (HCC)    a. EF 35-40% in past;  b. Echo 7/13:  EF 45-50%, Gr 2 diast dysfn, mild AI, mild MAC, trivial MR, mild LAE, PASP 47.  .Marland KitchenChronic ulcer of leg ([place]    04-09-15 resolved-not a problem.  . Colon polyps 04/12/2013   Rectosigmoid polyp  . COPD (chronic obstructive pulmonary disease) (HCC)    O2 at night  . Depression   . Diabetes mellitus   . Diabetes mellitus, type 2 ([place] 04/03/2012   HIGH RISK FEET.. Please have patient take shoes and socks off every visit for visual foot inspection.    . Eczema   . Elevated alkaline phosphatase level    GGT and 5'nucleotidase 8/13 normal  . History of oxygen administration    oxygen @ 2 l/m nasally bedtime 24/7  . HTN (hypertension)   . Hx of cardiac cath    a. [place] normal;  b. LHC 6/13:  Mild calcification in the LM, o/w normal coronary arteries, EF 45%.   . Hyperlipidemia   . Hyperlipidemia associated with type  2 diabetes mellitus ([place]) 05/25/2007   Qualifier: Diagnosis of  By: Hassell Done FNP, Tori Milks    . Hyperthyroidism, subclinical   . Implantable cardioverter-defibrillator (ICD) generator end of life   . Lipoma   . NICM (nonischemic cardiomyopathy) ([place])   . Obesity   . On home oxygen therapy    "2L; 24/7" (10/11/2016)  . Post menopausal syndrome   . Sleep apnea    pt denies 04/12/2013   Social History   Socioeconomic History  . Marital status: Widowed    Spouse name: Alroy Dust  . Number of children: 3  . Years of education: 80  . Highest education level: Not on file  Occupational History  .  Occupation: disabled  Social Needs  . Financial resource strain: Not on file  . Food insecurity:    Worry: Not on file    Inability: Not on file  . Transportation needs:    Medical: Not on file    Non-medical: Not on file  Tobacco Use  . Smoking status: Current Every Day Smoker    Packs/day: 0.30    Years: 39.00    Pack years: 11.70    Types: Cigarettes  . Smokeless tobacco: Never Used  . Tobacco comment: 1 cig/day.  Substance and Sexual Activity  . Alcohol use: No    Alcohol/week: 1.0 standard drinks    Types: 1 Glasses of wine per week  . Drug use: No  . Sexual activity: Not Currently  Lifestyle  . Physical activity:    Days per week: Not on file    Minutes per session: Not on file  . Stress: Not on file  Relationships  . Social connections:    Talks on phone: Not on file    Gets together: Not on file    Attends religious service: Not on file    Active member of club or organization: Not on file    Attends meetings of clubs or organizations: Not on file    Relationship status: Not on file  Other Topics Concern  . Not on file  Social History Narrative   ** Merged History Encounter **       Married   Family History  Problem Relation Age of Onset  . Stroke Mother   . Seizures Father   . Heart disease Father   . Diabetes Sister   . Asthma Maternal Aunt        aunts  . Asthma Maternal Uncle        uncles  . Heart disease Paternal Aunt        aunts  . Heart disease Paternal Uncle        uncles  . Heart disease Maternal Aunt        aunts  . Heart disease Maternal Uncle        uncles  . Heart disease Maternal Grandfather   . Colon cancer Neg Hx   . Colon polyps Neg Hx   . Esophageal cancer Neg Hx   . Kidney disease Neg Hx   . Gallbladder disease Neg Hx    O: Component Value Date/Time   CHOL 144 07/21/2014 1636   HDL 54 07/21/2014 1636   TRIG 149 07/21/2014 1636   AST 23 07/14/2017 0650   ALT 17 07/14/2017 0650   NA 142 02/28/2018 1327   K 4.2  02/28/2018 1327   CL 99 02/28/2018 1327   CO2 24 02/28/2018 1327   GLUCOSE 222 (H) 02/28/2018 1327   GLUCOSE 337 (H)  12/13/2017 1654   HGBA1C 12.7 (A) 06/13/2018 1511   HGBA1C 8.8 (H) 10/13/2016 0528   BUN 21 02/28/2018 1327   CREATININE 0.90 02/28/2018 1327   CREATININE 0.88 06/16/2016 1338   CALCIUM 9.4 02/28/2018 1327   GFRNONAA 68 02/28/2018 1327   GFRNONAA 75 02/13/2015 1015   GFRAA 78 02/28/2018 1327   GFRAA 87 02/13/2015 1015   WBC 9.1 07/15/2017 0357   HGB 14.4 07/15/2017 0357   HGB 14.1 02/08/2017 1551   HCT 45.3 07/15/2017 0357   HCT 44.5 02/08/2017 1551   PLT 177 07/15/2017 0357   PLT 246 02/08/2017 1551   TSH 0.603 02/10/2016 1609   TSH 0.415 08/06/2014 0939   Ht Readings from Last 2 Encounters:  06/13/18 5' 1"  (1.549 m)  04/13/18 5' (1.524 m)   Wt Readings from Last 2 Encounters:  06/13/18 214 lb 14.4 oz (97.5 kg)  04/13/18 214 lb 8 oz (97.3 kg)   There is no height or weight on file to calculate BMI. BP Readings from Last 3 Encounters:  06/13/18 138/77  04/13/18 (!) 152/81  04/07/18 (!) 176/93   A/P: Patient was referred by Dr. Berline Lopes for CGM and DM med management. Patient has a history of med adherence/home management challenges. She reports taking Synjardy 12.5-500 mg daily, Humalog (insulin lispro) 8 units TID with meals and Lantus (insulin glargine) 30 units daily. Patient reports interest in weight loss and regimen simplification due to challenges managing medications at home and cost. She does not check BG at home due to running out of test strips for her meter. She states that she did order test strips and is expecting them to arrive through mail order this week. Advised patient to hold insulin glargine for now (may re-start depending on BG monitoring results), initiate liraglutide 0.6 mg daily (sample provided), RTC 1 week. Patient education was provided for liraglutide. Patient does have history of concern for gastroparesis in chart, but gastric  emptying test in 2018 was normal.  Freestyle Libre Pro CGM sensor placed. Patient was educated about wearing sensor, keeping food, activity and medication log and when to call office. She was educated about how to care for the sensor and not to have an MRI, CT or Diathermy. Lot #:[id number] C Serial #:[id number] Expiration Date:01/01/2019  An after visit summary was provided and patient advised to follow up in 1 week or sooner if any changes in condition or questions regarding medications arise.   The patient verbalized understanding of information provided by repeating back concepts discussed.   15 minutes spent face-to-face with the patient during the encounter. 75% of time spent on education. 25% of time was spent on assessment, plan, and coordination of care.

## 2018-06-21 NOTE — Addendum Note (Signed)
Addended by: Forde Dandy on: 06/21/2018 03:25 PM   Modules accepted: Orders

## 2018-06-27 ENCOUNTER — Ambulatory Visit (INDEPENDENT_AMBULATORY_CARE_PROVIDER_SITE_OTHER): Payer: Medicare Other | Admitting: Pharmacist

## 2018-06-27 DIAGNOSIS — Z79899 Other long term (current) drug therapy: Secondary | ICD-10-CM | POA: Diagnosis not present

## 2018-06-27 DIAGNOSIS — E1142 Type 2 diabetes mellitus with diabetic polyneuropathy: Secondary | ICD-10-CM | POA: Diagnosis not present

## 2018-06-27 DIAGNOSIS — Z794 Long term (current) use of insulin: Secondary | ICD-10-CM

## 2018-06-27 MED ORDER — NITROGLYCERIN 0.4 MG SL SUBL: 0.4000 mg | SUBLINGUAL_TABLET | SUBLINGUAL | 3 refills | Status: DC | PRN

## 2018-06-27 NOTE — Patient Instructions (Signed)
Increase Victoza (liraglutide) 1.2 mg daily Continue all other medications as prescribed Follow up in 1 week

## 2018-06-27 NOTE — Progress Notes (Signed)
S: [name] is a 67 y.o. female reports to clinical pharmacist appointment for diabetes management and medication history. Patient did  bring medication bottles.  Allergies  Allergen Reactions  . Actos [Pioglitazone] Other (See Comments)    REACTION: congestive heart failure   . Naproxen Other (See Comments)    574m dose made her sleep for two days  . Rosiglitazone Hives  . Hydrocortisone Other (See Comments)    unknown   Prior to Admission medications   Medication Sig Start Date End Date Taking? Authorizing Provider  albuterol (PROVENTIL) (2.5 MG/3ML) 0.083% nebulizer solution USE 1 VIAL VIA NEBULIZER EVERY 6 HOURS AS NEEDED FOR WHEEZING OR SHORTNESS OF BREATH 11/22/16   Burns, AArloa Koh MD  aspirin EC 81 MG tablet Take 1 tablet (81 mg total) by mouth daily. 11/13/15   Burns, AArloa Koh MD  atorvastatin (LIPITOR) 80 MG tablet Take 1 tablet (80 mg total) by mouth at bedtime. 06/21/17   HKathi Ludwig MD  betamethasone dipropionate (DIPROLENE) 0.05 % cream Apply topically 2 (two) times daily. 06/13/18   HKathi Ludwig MD  diphenhydrAMINE (BENADRYL) 25 mg capsule Take 1 capsule (25 mg total) by mouth every 6 (six) hours as needed. 05/17/17 05/17/18  RShela Leff MD  Empagliflozin-metFORMIN HCl (SYNJARDY) 12.5-500 MG TABS Take 1 tablet by mouth daily. With a meal 02/28/18   HKathi Ludwig MD  Fluticasone-Salmeterol (ADVAIR DISKUS) 250-50 MCG/DOSE AEPB Inhale 1 puff into the lungs 2 (two) times daily. 05/31/17 05/31/18  RShela Leff MD  furosemide (LASIX) 80 MG tablet Take 1 tablet (80 mg total) by mouth 2 (two) times daily. 01/12/18 07/11/18  GVelna Ochs MD  glucose blood (ACCU-CHEK AVIVA) test strip The patient is insulin requiring, ICD 10 code E11.42 . The patient tests 3 times per day. 06/21/18   NAldine Contes MD  insulin lispro (HUMALOG KWIKPEN) 100 UNIT/ML KiwkPen Inject 0.08 mLs (8 Units total) into the skin 3 (three) times daily with meals. 06/13/18    HKathi Ludwig MD  liraglutide (VICTOZA) 18 MG/3ML SOPN Inject 0.6 mg into the skin daily.    [provider]  lisinopril (PRINIVIL,ZESTRIL) 40 MG tablet Take 1 tablet (40 mg total) by mouth daily. 01/12/18 05/12/18  GVelna Ochs MD  metoprolol succinate (TOPROL-XL) 50 MG 24 hr tablet Take 2 tablets (100 mg total) by mouth daily. 01/12/18   GVelna Ochs MD  nitroGLYCERIN (NITROSTAT) 0.4 MG SL tablet Place 0.4 mg under the tongue every 5 (five) minutes as needed for chest pain. Reported on 03/18/2016    [provider]  pantoprazole (PROTONIX) 40 MG tablet Take 1 tablet (40 mg total) by mouth daily. 02/08/17   Burns, AArloa Koh MD  spironolactone (ALDACTONE) 25 MG tablet TAKE 0.5 TABLETS BY MOUTH EVERY DAY 03/15/18   HKathi Ludwig MD  tiotropium (SPIRIVA) 18 MCG inhalation capsule Place 1 capsule (18 mcg total) into inhaler and inhale daily. 06/21/17   HKathi Ludwig MD   Past Medical History:  Diagnosis Date  . ATN (acute tubular necrosis) ([place] 07/15/2014  . Automatic implantable cardioverter-defibrillator in situ   . Cardiac defibrillator in situ    Atlas II VR (SJM) implanted by Dr TLovena Le . CHF (congestive heart failure) ([place]   . Chronic combined systolic and diastolic heart failure (HCC)    a. EF 35-40% in past;  b. Echo 7/13:  EF 45-50%, Gr 2 diast dysfn, mild AI, mild MAC, trivial MR, mild LAE, PASP 47.  .Marland KitchenChronic ulcer of leg ([place]  04-09-15 resolved-not a problem.  . Colon polyps 04/12/2013   Rectosigmoid polyp  . COPD (chronic obstructive pulmonary disease) (HCC)    O2 at night  . Depression   . Diabetes mellitus   . Diabetes mellitus, type 2 ([place]) 04/03/2012   HIGH RISK FEET.. Please have patient take shoes and socks off every visit for visual foot inspection.    . Eczema   . Elevated alkaline phosphatase level    GGT and 5'nucleotidase 8/13 normal  . History of oxygen administration    oxygen @ 2 l/m nasally bedtime 24/7  . HTN  (hypertension)   . Hx of cardiac cath    a. [place] 2003 normal;  b. LHC 6/13:  Mild calcification in the LM, o/w normal coronary arteries, EF 45%.   . Hyperlipidemia   . Hyperlipidemia associated with type 2 diabetes mellitus ([place]) 05/25/2007   Qualifier: Diagnosis of  By: Hassell Done FNP, Tori Milks    . Hyperthyroidism, subclinical   . Implantable cardioverter-defibrillator (ICD) generator end of life   . Lipoma   . NICM (nonischemic cardiomyopathy) ([place])   . Obesity   . On home oxygen therapy    "2L; 24/7" (10/11/2016)  . Post menopausal syndrome   . Sleep apnea    pt denies 04/12/2013   Social History   Socioeconomic History  . Marital status: Widowed    Spouse name: Alroy Dust  . Number of children: 3  . Years of education: 46  . Highest education level: Not on file  Occupational History  . Occupation: disabled  Social Needs  . Financial resource strain: Not on file  . Food insecurity:    Worry: Not on file    Inability: Not on file  . Transportation needs:    Medical: Not on file    Non-medical: Not on file  Tobacco Use  . Smoking status: Current Every Day Smoker    Packs/day: 0.30    Years: 39.00    Pack years: 11.70    Types: Cigarettes  . Smokeless tobacco: Never Used  . Tobacco comment: 1 cig/day.  Substance and Sexual Activity  . Alcohol use: No    Alcohol/week: 1.0 standard drinks    Types: 1 Glasses of wine per week  . Drug use: No  . Sexual activity: Not Currently  Lifestyle  . Physical activity:    Days per week: Not on file    Minutes per session: Not on file  . Stress: Not on file  Relationships  . Social connections:    Talks on phone: Not on file    Gets together: Not on file    Attends religious service: Not on file    Active member of club or organization: Not on file    Attends meetings of clubs or organizations: Not on file    Relationship status: Not on file  Other Topics Concern  . Not on file  Social History Narrative   ** Merged History  Encounter **       Married   Family History  Problem Relation Age of Onset  . Stroke Mother   . Seizures Father   . Heart disease Father   . Diabetes Sister   . Asthma Maternal Aunt        aunts  . Asthma Maternal Uncle        uncles  . Heart disease Paternal Aunt        aunts  . Heart disease Paternal Uncle  uncles  . Heart disease Maternal Aunt        aunts  . Heart disease Maternal Uncle        uncles  . Heart disease Maternal Grandfather   . Colon cancer Neg Hx   . Colon polyps Neg Hx   . Esophageal cancer Neg Hx   . Kidney disease Neg Hx   . Gallbladder disease Neg Hx     O:    Component Value Date/Time   CHOL 144 07/21/2014 1636   HDL 54 07/21/2014 1636   TRIG 149 07/21/2014 1636   AST 23 07/14/2017 0650   ALT 17 07/14/2017 0650   NA 142 02/28/2018 1327   K 4.2 02/28/2018 1327   CL 99 02/28/2018 1327   CO2 24 02/28/2018 1327   GLUCOSE 222 (H) 02/28/2018 1327   GLUCOSE 337 (H) 12/13/2017 1654   HGBA1C 12.7 (A) 06/13/2018 1511   HGBA1C 8.8 (H) 10/13/2016 0528   BUN 21 02/28/2018 1327   CREATININE 0.90 02/28/2018 1327   CREATININE 0.88 06/16/2016 1338   CALCIUM 9.4 02/28/2018 1327   GFRNONAA 68 02/28/2018 1327   GFRNONAA 75 02/13/2015 1015   GFRAA 78 02/28/2018 1327   GFRAA 87 02/13/2015 1015   WBC 9.1 07/15/2017 0357   HGB 14.4 07/15/2017 0357   HGB 14.1 02/08/2017 1551   HCT 45.3 07/15/2017 0357   HCT 44.5 02/08/2017 1551   PLT 177 07/15/2017 0357   PLT 246 02/08/2017 1551   TSH 0.603 02/10/2016 1609   TSH 0.415 08/06/2014 0939   Ht Readings from Last 2 Encounters:  06/13/18 _0  (1.549 m)  04/13/18 5' (1.524 m)   Wt Readings from Last 2 Encounters:  06/13/18 214 lb 14.4 oz (97.5 kg)  04/13/18 214 lb 8 oz (97.3 kg)   There is no height or weight on file to calculate BMI. BP Readings from Last 3 Encounters:  06/13/18 138/77  04/13/18 (!) 152/81  04/07/18 (!) 176/93    A/P: Aretta Nip Libre Results Downloaded and  Reviewed:  7-day BG 918 774 3554 mg/dL(correlates with an A1C of ~8.1%,), within target range58% of the time  Above 180 mg/dL42% of the time  Below 70 mg/dL0% of the time  Coefficient of variation33.7%  Standard deviation:65.26m/dL  Increase Victoza to 1.2 mg daily. Continue Humalog 8 units at breakfast and lunch, and 5 units at dinner.  Medication history was done with patient and she is compliant with medications. Follow up with Advair inhaler to see if patient is using it. She may be a candidate for ICS step down therapy since she has inconsistent refills of Advair. Patient not taking pantoprazole.   Patient is working towards smoking cessation (down to 1/2 pack per day).  An after visit summary was provided and patient advised to follow up inone weekor sooner if any changes in condition or questions regarding medications arise.  Thepatientverbalizedunderstanding of information provided by repeating back concepts discussed.  IAcie Fredrickson PharmD Candidate

## 2018-06-27 NOTE — Addendum Note (Signed)
Addended by: Forde Dandy on: 06/27/2018 06:03 PM   Modules accepted: Orders

## 2018-06-28 LAB — BMP8+ANION GAP
Anion Gap: 15 mmol/L (ref 10.0–18.0)
BUN/Creatinine Ratio: 25 (ref 12–28)
BUN: 24 mg/dL (ref 8–27)
CO2: 25 mmol/L (ref 20–29)
Calcium: 9.8 mg/dL (ref 8.7–10.3)
Chloride: 102 mmol/L (ref 96–106)
Creatinine, Ser: 0.96 mg/dL (ref 0.57–1.00)
GFR calc Af Amer: 72 mL/min/{1.73_m2} (ref >59)
GFR calc non Af Amer: 63 mL/min/{1.73_m2} (ref >59)
Glucose: 156 mg/dL — ABNORMAL HIGH (ref 65–99)
Potassium: 4.3 mmol/L (ref 3.5–5.2)
Sodium: 142 mmol/L (ref 134–144)

## 2018-06-29 DIAGNOSIS — J449 Chronic obstructive pulmonary disease, unspecified: Secondary | ICD-10-CM | POA: Diagnosis not present

## 2018-07-04 ENCOUNTER — Ambulatory Visit: Payer: Medicare Other | Admitting: Pharmacist

## 2018-07-04 DIAGNOSIS — E1142 Type 2 diabetes mellitus with diabetic polyneuropathy: Secondary | ICD-10-CM

## 2018-07-04 DIAGNOSIS — Z794 Long term (current) use of insulin: Secondary | ICD-10-CM

## 2018-07-06 MED ORDER — ONETOUCH DELICA LANCETS FINE MISC: 1.0000 | Freq: Three times a day (TID) | 11 refills | Status: DC

## 2018-07-06 MED ORDER — ONETOUCH VERIO W/DEVICE KIT: 1.0000 | PACK | Freq: Once | 0 refills | Status: AC

## 2018-07-06 MED ORDER — GLUCOSE BLOOD VI STRP: ORAL_STRIP | 12 refills | Status: DC

## 2018-07-06 NOTE — Progress Notes (Addendum)
S: [name] is a 66 y.o. female reports to clinical pharmacist appointment for continuous glucose monitoring/diabetes med management follow up per Dr. Harbrecht.  Allergies  Allergen Reactions  . Actos [Pioglitazone] Other (See Comments)    REACTION: congestive heart failure   . Naproxen Other (See Comments)    500mg dose made her sleep for two days  . Rosiglitazone Hives  . Hydrocortisone Other (See Comments)    unknown   Medication Sig  albuterol (PROVENTIL) (2.5 MG/3ML) 0.083% nebulizer solution USE 1 VIAL VIA NEBULIZER EVERY 6 HOURS AS NEEDED FOR WHEEZING OR SHORTNESS OF BREATH  aspirin EC 81 MG tablet Take 1 tablet (81 mg total) by mouth daily.  atorvastatin (LIPITOR) 80 MG tablet Take 1 tablet (80 mg total) by mouth at bedtime.  betamethasone dipropionate (DIPROLENE) 0.05 % cream Apply topically 2 (two) times daily.  Blood Glucose Monitoring Suppl (ONETOUCH VERIO) w/Device KIT 1 Device by Does not apply route once for 1 dose.  Empagliflozin-metFORMIN HCl (SYNJARDY) 12.5-500 MG TABS Take 1 tablet by mouth daily. With a meal  Fluticasone-Salmeterol (ADVAIR DISKUS) 250-50 MCG/DOSE AEPB Inhale 1 puff into the lungs 2 (two) times daily.  furosemide (LASIX) 80 MG tablet Take 1 tablet (80 mg total) by mouth 2 (two) times daily.  glucose blood test strip Use as instructed  insulin lispro (HUMALOG KWIKPEN) 100 UNIT/ML KiwkPen Inject 0.08 mLs (8 Units total) into the skin 3 (three) times daily with meals.  liraglutide (VICTOZA) 18 MG/3ML SOPN Inject 1.2 mg into the skin daily.   lisinopril (PRINIVIL,ZESTRIL) 40 MG tablet Take 1 tablet (40 mg total) by mouth daily.  metoprolol succinate (TOPROL-XL) 50 MG 24 hr tablet Take 2 tablets (100 mg total) by mouth daily.  nitroGLYCERIN (NITROSTAT) 0.4 MG SL tablet Place 1 tablet (0.4 mg total) under the tongue every 5 (five) minutes as needed for chest pain. Reported on 03/18/2016  ONETOUCH DELICA LANCETS FINE MISC 1 Stick by Does not apply route 3  (three) times daily.  pantoprazole (PROTONIX) 40 MG tablet Take 1 tablet (40 mg total) by mouth daily.  spironolactone (ALDACTONE) 25 MG tablet TAKE 0.5 TABLETS BY MOUTH EVERY DAY  tiotropium (SPIRIVA) 18 MCG inhalation capsule Place 1 capsule (18 mcg total) into inhaler and inhale daily.   Past Medical History:  Diagnosis Date  . ATN (acute tubular necrosis) (HCC) 07/15/2014  . Automatic implantable cardioverter-defibrillator in situ   . Cardiac defibrillator in situ    Atlas II VR (SJM) implanted by Dr Taylor  . CHF (congestive heart failure) (HCC)   . Chronic combined systolic and diastolic heart failure (HCC)    a. EF 35-40% in past;  b. Echo 7/13:  EF 45-50%, Gr 2 diast dysfn, mild AI, mild MAC, trivial MR, mild LAE, PASP 47.  . Chronic ulcer of leg (HCC)    04-09-15 resolved-not a problem.  . Colon polyps 04/12/2013   Rectosigmoid polyp  . COPD (chronic obstructive pulmonary disease) (HCC)    O2 at night  . Depression   . Diabetes mellitus   . Diabetes mellitus, type 2 (HCC) 04/03/2012   HIGH RISK FEET.. Please have patient take shoes and socks off every visit for visual foot inspection.    . Eczema   . Elevated alkaline phosphatase level    GGT and 5'nucleotidase 8/13 normal  . History of oxygen administration    oxygen @ 2 l/m nasally bedtime 24/7  . HTN (hypertension)   . Hx of cardiac cath      a. [place] 2003 normal;  b. LHC 6/13:  Mild calcification in the LM, o/w normal coronary arteries, EF 45%.   . Hyperlipidemia   . Hyperlipidemia associated with type 2 diabetes mellitus ([place]) 05/25/2007   Qualifier: Diagnosis of  By: Hassell Done FNP, Tori Milks    . Hyperthyroidism, subclinical   . Implantable cardioverter-defibrillator (ICD) generator end of life   . Lipoma   . NICM (nonischemic cardiomyopathy) ([place])   . Obesity   . On home oxygen therapy    "2L; 24/7" (10/11/2016)  . Post menopausal syndrome   . Sleep apnea    pt denies 04/12/2013   Social History   Socioeconomic History   . Marital status: Widowed    Spouse name: Alroy Dust  . Number of children: 3  . Years of education: 37  . Highest education level: Not on file  Occupational History  . Occupation: disabled  Social Needs  . Financial resource strain: Not on file  . Food insecurity:    Worry: Not on file    Inability: Not on file  . Transportation needs:    Medical: Not on file    Non-medical: Not on file  Tobacco Use  . Smoking status: Current Every Day Smoker    Packs/day: 0.30    Years: 39.00    Pack years: 11.70    Types: Cigarettes  . Smokeless tobacco: Never Used  . Tobacco comment: 1 cig/day.  Substance and Sexual Activity  . Alcohol use: No    Alcohol/week: 1.0 standard drinks    Types: 1 Glasses of wine per week  . Drug use: No  . Sexual activity: Not Currently  Lifestyle  . Physical activity:    Days per week: Not on file    Minutes per session: Not on file  . Stress: Not on file  Relationships  . Social connections:    Talks on phone: Not on file    Gets together: Not on file    Attends religious service: Not on file    Active member of club or organization: Not on file    Attends meetings of clubs or organizations: Not on file    Relationship status: Not on file  Other Topics Concern  . Not on file  Social History Narrative   ** Merged History Encounter **       Married   Family History  Problem Relation Age of Onset  . Stroke Mother   . Seizures Father   . Heart disease Father   . Diabetes Sister   . Asthma Maternal Aunt        aunts  . Asthma Maternal Uncle        uncles  . Heart disease Paternal Aunt        aunts  . Heart disease Paternal Uncle        uncles  . Heart disease Maternal Aunt        aunts  . Heart disease Maternal Uncle        uncles  . Heart disease Maternal Grandfather   . Colon cancer Neg Hx   . Colon polyps Neg Hx   . Esophageal cancer Neg Hx   . Kidney disease Neg Hx   . Gallbladder disease Neg Hx    O:    Component Value  Date/Time   CHOL 144 07/21/2014 1636   HDL 54 07/21/2014 1636   TRIG 149 07/21/2014 1636   AST 23 07/14/2017 0650   ALT 17 07/14/2017 0650  NA 142 06/27/2018 1114   K 4.3 06/27/2018 1114   CL 102 06/27/2018 1114   CO2 25 06/27/2018 1114   GLUCOSE 156 (H) 06/27/2018 1114   GLUCOSE 337 (H) 12/13/2017 1654   HGBA1C 12.7 (A) 06/13/2018 1511   HGBA1C 8.8 (H) 10/13/2016 0528   BUN 24 06/27/2018 1114   CREATININE 0.96 06/27/2018 1114   CREATININE 0.88 06/16/2016 1338   CALCIUM 9.8 06/27/2018 1114   GFRNONAA 63 06/27/2018 1114   GFRNONAA 75 02/13/2015 1015   GFRAA 72 06/27/2018 1114   GFRAA 87 02/13/2015 1015   WBC 9.1 07/15/2017 0357   HGB 14.4 07/15/2017 0357   HGB 14.1 02/08/2017 1551   HCT 45.3 07/15/2017 0357   HCT 44.5 02/08/2017 1551   PLT 177 07/15/2017 0357   PLT 246 02/08/2017 1551   TSH 0.603 02/10/2016 1609   TSH 0.415 08/06/2014 0939   Ht Readings from Last 2 Encounters:  06/13/18 5' 1" (1.549 m)  04/13/18 5' (1.524 m)   Wt Readings from Last 2 Encounters:  06/13/18 214 lb 14.4 oz (97.5 kg)  04/13/18 214 lb 8 oz (97.3 kg)   There is no height or weight on file to calculate BMI. BP Readings from Last 3 Encounters:  06/13/18 138/77  04/13/18 (!) 152/81  04/07/18 (!) 176/93    A/P: Freestyle Libre Results Downloaded and Reviewed  Week #2 of monitoring  10-day BG average 191, within target range 58% of the time  Above 180 mg/dL 42% of the time, highest around evening  Below 70 mg/dL 0% of the time  Patient is currently taking Humalog 8 units TID and empagliflozin-metformin (Synjardy) 12.5-500 mg BID. She reports no symptoms of concern today, no side effects experienced with liraglutide. Patient was advised to increase liraglutide dose to 1.8 mg daily, follow up with PCP.  Recommendations  Titrate metformin  Reduce empagliflozin to 10 mg daily due to no added benefit with higher doses of empagliflozin  Switch to Synjardy XR to reduce risk of GI  intolerance  CGM sensor was removed. Patient was also advised to monitor BG at home 3-4 times daily. She reports that her home BG meter is not functioning and is a few years old. New BG meter with supplies ordered for patient today.  An after visit summary was provided and patient advised to contact clinic if any changes in condition or questions regarding medications arise.   The patient verbalized understanding of information provided by repeating back concepts discussed.   15 minutes spent face-to-face with the patient during the encounter. 50% of time spent on education. 50% of time was spent on assessment, plan, coordination of care. 

## 2018-07-11 ENCOUNTER — Ambulatory Visit (INDEPENDENT_AMBULATORY_CARE_PROVIDER_SITE_OTHER): Payer: Medicare Other | Admitting: Internal Medicine

## 2018-07-11 ENCOUNTER — Encounter: Payer: Self-pay | Admitting: Internal Medicine

## 2018-07-11 ENCOUNTER — Other Ambulatory Visit (HOSPITAL_COMMUNITY)
Admission: RE | Admit: 2018-07-11 | Discharge: 2018-07-11 | Disposition: A | Discharge status: Home / Self Care | Payer: Medicare Other | Source: Ambulatory Visit | Attending: Internal Medicine | Admitting: Internal Medicine

## 2018-07-11 VITALS — BP 118/88 | HR 107 | Temp 98.7°F | Wt 210.0 lb

## 2018-07-11 DIAGNOSIS — E1142 Type 2 diabetes mellitus with diabetic polyneuropathy: Secondary | ICD-10-CM

## 2018-07-11 DIAGNOSIS — F329 Major depressive disorder, single episode, unspecified: Secondary | ICD-10-CM

## 2018-07-11 DIAGNOSIS — Z794 Long term (current) use of insulin: Secondary | ICD-10-CM | POA: Diagnosis not present

## 2018-07-11 DIAGNOSIS — L859 Epidermal thickening, unspecified: Secondary | ICD-10-CM

## 2018-07-11 DIAGNOSIS — L308 Other specified dermatitis: Secondary | ICD-10-CM | POA: Diagnosis not present

## 2018-07-11 DIAGNOSIS — R21 Rash and other nonspecific skin eruption: Secondary | ICD-10-CM

## 2018-07-11 MED ORDER — LIRAGLUTIDE 18 MG/3ML ~~LOC~~ SOPN: 1.2000 mg | PEN_INJECTOR | Freq: Every day | SUBCUTANEOUS | 5 refills | Status: DC

## 2018-07-11 MED ORDER — EMPAGLIFLOZIN-METFORMIN HCL ER 10-1000 MG PO TB24: 10.0000 mg | ORAL_TABLET | Freq: Every day | ORAL | 0 refills | Status: DC

## 2018-07-11 MED ORDER — GLUCOSE BLOOD VI STRP: ORAL_STRIP | 2 refills | Status: DC

## 2018-07-11 MED ORDER — EMPAGLIFLOZIN-METFORMIN HCL 12.5-500 MG PO TABS: 1.0000 | ORAL_TABLET | Freq: Every day | ORAL | 3 refills | Status: DC

## 2018-07-11 MED ORDER — LANCETS MISC: 1.0000 | Freq: Four times a day (QID) | 3 refills | Status: DC

## 2018-07-11 MED ORDER — CONTOUR NEXT MONITOR W/DEVICE KIT: 1.0000 | PACK | Freq: Four times a day (QID) | 0 refills | Status: DC

## 2018-07-11 MED ORDER — BETAMETHASONE DIPROPIONATE 0.05 % EX CREA: TOPICAL_CREAM | Freq: Two times a day (BID) | CUTANEOUS | 0 refills | Status: DC

## 2018-07-11 NOTE — Patient Instructions (Signed)
FOLLOW-UP INSTRUCTIONS When: 3-4 months For: Routine visit What to bring: All of your medications  I have stopped your insulin, continued the Victoza and continued the [place] today. We will call you if any other changes need to be made.  Thank you for your visit to the [facility] today.

## 2018-07-11 NOTE — Progress Notes (Signed)
CC: Rash  HPI:[name] is a 67 y.o. female who presents for evaluation of a persistent rash. Please see individual problem based A/P for details.  Punch Biopsy Procedure Note  Pre-operative Diagnosis: Nonspecific hyperkeratotic rash of the palms and plantar surfaces of the feet bilaterally.  Anesthesia: Lidocaine 1% without epinephrine   Procedure Details  History of allergy to lidocaine: no Sensitivity to epinephrine: no  Patient informed of the risks (including bleeding and infection) and benefits of the  procedure and verbal informed consent obtained.  The lesion and surrounding area was prepped with an alcohol swab. The skin was then stretched perpendicular to the skin tension lines and the lesion removed using the 46m punch. The resulting ellipse was then bandaged. The wound was not closed due to the minimal size. Antibiotic ointment and a sterile dressing applied. The specimen was sent for pathologic examination. The patient tolerated the procedure well.  Condition: Stable  Complications: none.  Plan: 1. Instructed to keep the wound dry and covered for 24-48 hours and clean thereafter. 2. Patient instructed to apply Vaseline daily until healed. 3. Warning signs of infection were reviewed.    PHQ-9: Based on the patients  score we have 6 we will continue to monitor.  Past Medical History:  Diagnosis Date  . ATN (acute tubular necrosis) ([place] 07/15/2014  . Automatic implantable cardioverter-defibrillator in situ   . Cardiac defibrillator in situ    Atlas II VR (SJM) implanted by Dr TLovena Le . CHF (congestive heart failure) ([place]   . Chronic combined systolic and diastolic heart failure (HCC)    a. EF 35-40% in past;  b. Echo 7/13:  EF 45-50%, Gr 2 diast dysfn, mild AI, mild MAC, trivial MR, mild LAE, PASP 47.  .Marland KitchenChronic ulcer of leg ([place]    04-09-15 resolved-not a problem.  . Colon polyps 04/12/2013   Rectosigmoid polyp  . COPD (chronic obstructive pulmonary  disease) (HCC)    O2 at night  . Depression   . Diabetes mellitus   . Diabetes mellitus, type 2 ([place] 04/03/2012   HIGH RISK FEET.. Please have patient take shoes and socks off every visit for visual foot inspection.    . Eczema   . Elevated alkaline phosphatase level    GGT and 5'nucleotidase 8/13 normal  . History of oxygen administration    oxygen @ 2 l/m nasally bedtime 24/7  . HTN (hypertension)   . Hx of cardiac cath    a. [place] normal;  b. LHC 6/13:  Mild calcification in the LM, o/w normal coronary arteries, EF 45%.   . Hyperlipidemia   . Hyperlipidemia associated with type 2 diabetes mellitus ([place] 05/25/2007   Qualifier: Diagnosis of  By: MHassell DoneFNP, NTori Milks   . Hyperthyroidism, subclinical   . Implantable cardioverter-defibrillator (ICD) generator end of life   . Lipoma   . NICM (nonischemic cardiomyopathy) ([place]   . Obesity   . On home oxygen therapy    "2L; 24/7" (10/11/2016)  . Post menopausal syndrome   . Sleep apnea    pt denies 04/12/2013   Review of Systems:  ROS negative except as per HPI.  Physical Exam: Vitals:   07/11/18 1336  BP: 118/88  Pulse: (!) 107  Temp: 98.7 F (37.1 C)  TempSrc: Oral  SpO2: 96%  Weight: 210 lb (95.3 kg)   General: A/O x4, in no acute distress, afebrile, nondiaphoretic Cardio: RRR, no mrg's Pulmonary: CTA bilaterally  Skin: Rash is persistent,  hyperkeratotic, non-contiguous on the palms, plantar surface of the feet and external forearms, non-erythematous,   Assessment & Plan:   See Encounters Tab for problem based charting.  Patient discussed with Dr. Evette Doffing

## 2018-07-12 DIAGNOSIS — R21 Rash and other nonspecific skin eruption: Secondary | ICD-10-CM | POA: Insufficient documentation

## 2018-07-12 HISTORY — DX: Rash and other nonspecific skin eruption: R21

## 2018-07-12 NOTE — Assessment & Plan Note (Addendum)
Rash: Biopsy today, patient is amenable and it has not improved Hyperkeratotic. Minimal new lesions. Biopsy at site of what appears to be a developing lesion but unsure. See biopsy note on progress note.  Will notify patient of biopsy results Biopsy specimen sent for Dermatology pathology  Refilled betamethasone cream  Addendum: Called Patient to notify her of her biopsy results but no answer. I have placed another referral to Dermatology. She will need this to continue treatment.  For now, we will continue the betamethasone cream twice daily to all affected areas.  Spoke with patient regarding her lab results. We are referring her to Dermatology again. Hopefully the next dermatologist will accept her insurance.

## 2018-07-12 NOTE — Assessment & Plan Note (Signed)
See Hyperkeratotic dermatitis

## 2018-07-12 NOTE — Assessment & Plan Note (Signed)
HQ-9: Based on the patients  score we have 6 we will continue to monitor.

## 2018-07-12 NOTE — Assessment & Plan Note (Signed)
Diabetes: Change in therapy by Dr. Maudie Mercury our clinical pharmacist. She had discontinued her insulin as per Dr. Maudie Mercury but was continued on the Synjardy 12.5-500 with the recommendation to uptitrate the Metformin and decrease the empagliflozin to 10mg . However, it appears that the Synjardy XR 07-999 is not covered by the patients insurance. We will continue this discussion with Dr. Maudie Mercury. For now, I agree that she should continue to hold the insulin, and continue the current Synjardy 12.5-500 daily as well as the Victoza 1.2mg  daily.  I have made no actual changes to her regimen other than to discontinue the insulin in our system to reflect the changes that were made verbally before.   Plan: Ordered new glucometer with strips and lancets She is to record her blood glucose levels QID until our next visit A1c at that time We will discuss medication changes with Dr. Maudie Mercury and attempt to uptitrate her Metformin.

## 2018-07-12 NOTE — Progress Notes (Signed)
Internal Medicine Clinic Attending  I saw and evaluated the patient.  I personally confirmed the key portions of the history and exam documented by Dr. Harbrecht and I reviewed pertinent patient test results.  The assessment, diagnosis, and plan were formulated together and I agree with the documentation in the resident's note. I was present for the entirety of the procedure.  

## 2018-07-13 ENCOUNTER — Telehealth: Payer: Self-pay | Admitting: Pharmacist

## 2018-07-17 ENCOUNTER — Telehealth: Payer: Self-pay | Admitting: *Deleted

## 2018-07-17 DIAGNOSIS — L859 Epidermal thickening, unspecified: Secondary | ICD-10-CM

## 2018-07-17 DIAGNOSIS — Z794 Long term (current) use of insulin: Secondary | ICD-10-CM

## 2018-07-17 DIAGNOSIS — E1142 Type 2 diabetes mellitus with diabetic polyneuropathy: Secondary | ICD-10-CM

## 2018-07-17 MED ORDER — ACCU-CHEK FASTCLIX LANCET KIT: PACK | 0 refills | Status: DC

## 2018-07-17 NOTE — Telephone Encounter (Signed)
Fax from [facility] - pt needs a rx for [employer]. Thanks

## 2018-07-17 NOTE — Telephone Encounter (Signed)
Script sent  

## 2018-07-19 NOTE — Addendum Note (Signed)
Addended by: Nicola Girt on: 07/19/2018 01:41 PM   Modules accepted: Orders

## 2018-07-20 ENCOUNTER — Other Ambulatory Visit: Payer: Self-pay | Admitting: Internal Medicine

## 2018-07-20 ENCOUNTER — Encounter: Payer: Self-pay | Admitting: Internal Medicine

## 2018-07-20 DIAGNOSIS — Z1231 Encounter for screening mammogram for malignant neoplasm of breast: Secondary | ICD-10-CM

## 2018-07-20 MED ORDER — BETAMETHASONE DIPROPIONATE 0.05 % EX CREA: TOPICAL_CREAM | Freq: Two times a day (BID) | CUTANEOUS | 0 refills | Status: DC

## 2018-07-20 NOTE — Addendum Note (Signed)
Addended by: Nicola Girt on: 07/20/2018 02:29 PM   Modules accepted: Orders

## 2018-07-23 NOTE — Telephone Encounter (Signed)
67 yo F called to follow up on diabetes medication management.   Patient reports diabetes has been controlled since last visit. Patient checks blood glucose levels daily and reports blood glucose ranging from 107-145. Patient reports no levels below 70 or any signs/symptoms of hypoglycemia. Patient reports she has not experienced any side effects on her current regimen.   Patient reports current diabetic regimen includes: Victoza (liraglutide) 1.8mg  daily and Synjardy XL twice daily.   Plan:  -Continue current diabetes regimen  -Continue monitoring blood glucose levels daily -Informed patient to call if she experiences any changes or problems with her diabetes management. Patient communicated understanding.   Gwenlyn Found, Sherian Rein D PGY1 Pharmacy Resident  Phone [phone] 07/23/2018   11:23 AM

## 2018-07-29 DIAGNOSIS — J449 Chronic obstructive pulmonary disease, unspecified: Secondary | ICD-10-CM | POA: Diagnosis not present

## 2018-08-06 ENCOUNTER — Other Ambulatory Visit: Payer: Self-pay | Admitting: Pharmacist

## 2018-08-06 DIAGNOSIS — E1142 Type 2 diabetes mellitus with diabetic polyneuropathy: Secondary | ICD-10-CM

## 2018-08-06 DIAGNOSIS — J449 Chronic obstructive pulmonary disease, unspecified: Secondary | ICD-10-CM

## 2018-08-06 DIAGNOSIS — Z794 Long term (current) use of insulin: Secondary | ICD-10-CM

## 2018-08-06 MED ORDER — LIRAGLUTIDE 18 MG/3ML ~~LOC~~ SOPN: 1.8000 mg | PEN_INJECTOR | Freq: Every day | SUBCUTANEOUS | 3 refills | Status: DC

## 2018-08-06 MED ORDER — TIOTROPIUM BROMIDE MONOHYDRATE 18 MCG IN CAPS: 18.0000 ug | ORAL_CAPSULE | Freq: Every day | RESPIRATORY_TRACT | 11 refills | Status: DC

## 2018-08-06 MED ORDER — EMPAGLIFLOZIN-METFORMIN HCL ER 5-1000 MG PO TB24: 1.0000 | ORAL_TABLET | Freq: Two times a day (BID) | ORAL | 3 refills | Status: DC

## 2018-08-06 NOTE — Progress Notes (Signed)
Adjusted empagliflozin-metformin dose as discussed with PCP. Called patient and notified her of change. Patient also needed Spiriva refilled so prescription was sent.

## 2018-08-23 ENCOUNTER — Ambulatory Visit (INDEPENDENT_AMBULATORY_CARE_PROVIDER_SITE_OTHER): Payer: Medicare Other | Admitting: *Deleted

## 2018-08-23 DIAGNOSIS — I5042 Chronic combined systolic (congestive) and diastolic (congestive) heart failure: Secondary | ICD-10-CM | POA: Diagnosis not present

## 2018-08-23 DIAGNOSIS — I428 Other cardiomyopathies: Secondary | ICD-10-CM | POA: Diagnosis not present

## 2018-08-23 DIAGNOSIS — Z95810 Presence of automatic (implantable) cardiac defibrillator: Secondary | ICD-10-CM | POA: Diagnosis not present

## 2018-08-23 LAB — CUP PACEART INCLINIC DEVICE CHECK
Battery Remaining Longevity: 66 mo
Brady Statistic RV Percent Paced: 0 %
Date Time Interrogation Session: 20191121085758
HighPow Impedance: 50.0581
Implantable Lead Implant Date: 20080801
Implantable Lead Location: 753860
Implantable Lead Model: 7120
Implantable Pulse Generator Implant Date: 20150701
Lead Channel Impedance Value: 450 Ohm
Lead Channel Pacing Threshold Amplitude: 0.5 V
Lead Channel Pacing Threshold Amplitude: 0.5 V
Lead Channel Pacing Threshold Pulse Width: 0.5 ms
Lead Channel Pacing Threshold Pulse Width: 0.5 ms
Lead Channel Sensing Intrinsic Amplitude: 11.9 mV
Lead Channel Setting Pacing Amplitude: 2.5 V
Lead Channel Setting Pacing Pulse Width: 0.5 ms
Lead Channel Setting Sensing Sensitivity: 0.5 mV
Pulse Gen Serial Number: 7206538

## 2018-08-23 NOTE — Progress Notes (Signed)
ICD check in clinic. Normal device function. Threshold and sensing consistent with previous device measurements. Impedance trends stable over time. No evidence of any ventricular arrhythmias. Histogram distribution appropriate for patient and level of activity. Thoracic impedance declining, patient denies ShOB but reports lower extremity edema; I counseled her on a heart healthy diet and compliance with diuretic, she verbalizes understanding. No changes made this session. Device programmed at appropriate safety margins. Device programmed to optimize intrinsic conduction. Estimated longevity 5.5 years. Pt enrolled in remote follow-up. ROV with GT 11/23/18.

## 2018-08-28 ENCOUNTER — Ambulatory Visit: Payer: Medicare Other

## 2018-08-29 DIAGNOSIS — J449 Chronic obstructive pulmonary disease, unspecified: Secondary | ICD-10-CM | POA: Diagnosis not present

## 2018-09-07 ENCOUNTER — Ambulatory Visit
Admission: RE | Admit: 2018-09-07 | Discharge: 2018-09-07 | Disposition: A | Discharge status: Home / Self Care | Payer: Medicare Other | Source: Ambulatory Visit | Attending: Internal Medicine | Admitting: Internal Medicine

## 2018-09-07 DIAGNOSIS — Z1231 Encounter for screening mammogram for malignant neoplasm of breast: Secondary | ICD-10-CM

## 2018-09-28 DIAGNOSIS — J449 Chronic obstructive pulmonary disease, unspecified: Secondary | ICD-10-CM | POA: Diagnosis not present

## 2018-10-02 DIAGNOSIS — E119 Type 2 diabetes mellitus without complications: Secondary | ICD-10-CM | POA: Diagnosis not present

## 2018-10-02 DIAGNOSIS — H2513 Age-related nuclear cataract, bilateral: Secondary | ICD-10-CM | POA: Diagnosis not present

## 2018-10-02 LAB — HM DIABETES EYE EXAM

## 2018-10-04 ENCOUNTER — Encounter: Payer: Self-pay | Admitting: *Deleted

## 2018-10-08 ENCOUNTER — Encounter (INDEPENDENT_AMBULATORY_CARE_PROVIDER_SITE_OTHER): Payer: Self-pay

## 2018-10-08 ENCOUNTER — Ambulatory Visit (INDEPENDENT_AMBULATORY_CARE_PROVIDER_SITE_OTHER): Payer: Medicare Other | Admitting: Internal Medicine

## 2018-10-08 ENCOUNTER — Other Ambulatory Visit: Payer: Self-pay

## 2018-10-08 ENCOUNTER — Encounter: Payer: Self-pay | Admitting: Internal Medicine

## 2018-10-08 VITALS — BP 119/53 | HR 100 | Temp 97.4°F | Ht 61.0 in | Wt 205.2 lb

## 2018-10-08 DIAGNOSIS — L408 Other psoriasis: Secondary | ICD-10-CM | POA: Diagnosis not present

## 2018-10-08 DIAGNOSIS — I10 Essential (primary) hypertension: Secondary | ICD-10-CM | POA: Diagnosis not present

## 2018-10-08 DIAGNOSIS — Z794 Long term (current) use of insulin: Secondary | ICD-10-CM

## 2018-10-08 DIAGNOSIS — Z79899 Other long term (current) drug therapy: Secondary | ICD-10-CM

## 2018-10-08 DIAGNOSIS — Z7984 Long term (current) use of oral hypoglycemic drugs: Secondary | ICD-10-CM

## 2018-10-08 DIAGNOSIS — E1142 Type 2 diabetes mellitus with diabetic polyneuropathy: Secondary | ICD-10-CM | POA: Diagnosis not present

## 2018-10-08 DIAGNOSIS — R21 Rash and other nonspecific skin eruption: Secondary | ICD-10-CM

## 2018-10-08 DIAGNOSIS — L308 Other specified dermatitis: Secondary | ICD-10-CM

## 2018-10-08 DIAGNOSIS — J449 Chronic obstructive pulmonary disease, unspecified: Secondary | ICD-10-CM | POA: Diagnosis not present

## 2018-10-08 LAB — POCT GLYCOSYLATED HEMOGLOBIN (HGB A1C): Hemoglobin A1C: 9.3 % — AB (ref 4.0–5.6)

## 2018-10-08 LAB — GLUCOSE, CAPILLARY: Glucose-Capillary: 152 mg/dL — ABNORMAL HIGH (ref 70–99)

## 2018-10-08 MED ORDER — BETAMETHASONE DIPROPIONATE 0.05 % EX CREA: TOPICAL_CREAM | Freq: Two times a day (BID) | CUTANEOUS | 1 refills | Status: DC

## 2018-10-08 NOTE — Progress Notes (Signed)
   CC: Routine visit for HTN and DMII  HPI:Ms.SYMPHANIE CEDERBERG is a 68 y.o. female who presents for evaluation of HTN, DMII, COPD and dermatitis. Please see individual problem based A/P for details.  PHQ-9: Based on the patients    Office Visit from 10/08/2018 in [place]  PHQ-9 Total Score  0     score we have decided to continue routine screening.  Past Medical History:  Diagnosis Date  . ATN (acute tubular necrosis) ([place]) 07/15/2014  . Automatic implantable cardioverter-defibrillator in situ   . Automatic implantable cardioverter-defibrillator in situ 05/04/2007   Qualifier: Diagnosis of  By: Isla Pence    . Cardiac defibrillator in situ    Atlas II VR (SJM) implanted by Dr Lovena Le  . CHF (congestive heart failure) ([place])   . Chronic combined systolic and diastolic heart failure (HCC)    a. EF 35-40% in past;  b. Echo 7/13:  EF 45-50%, Gr 2 diast dysfn, mild AI, mild MAC, trivial MR, mild LAE, PASP 47.  [name] Chronic ulcer of leg ([place])    04-09-15 resolved-not a problem.  . Colon polyps 04/12/2013   Rectosigmoid polyp  . COPD (chronic obstructive pulmonary disease) (HCC)    O2 at night  . Depression   . Diabetes mellitus   . Diabetes mellitus, type 2 ([place]) 04/03/2012   HIGH RISK FEET.. Please have patient take shoes and socks off every visit for visual foot inspection.    . Eczema   . Elevated alkaline phosphatase level    GGT and 5'nucleotidase 8/13 normal  . Health care maintenance 01/22/2013   Surgically absent cervix- no pap needed (Path report 07/2000: uterine body with attached bilateral adnexa and separate cervix.)  . History of oxygen administration    oxygen @ 2 l/m nasally bedtime 24/7  . HTN (hypertension)   . Hx of cardiac cath    a. [place] 2003 normal;  b. LHC 6/13:  Mild calcification in the LM, o/w normal coronary arteries, EF 45%.   . Hyperkalemia 08/08/2017  . Hyperlipidemia   . Hyperlipidemia associated with type 2 diabetes mellitus ([place])  05/25/2007   Qualifier: Diagnosis of  By: Hassell Done FNP, Tori Milks    . Hyperthyroidism, subclinical   . Implantable cardioverter-defibrillator (ICD) generator end of life   . Lipoma   . NICM (nonischemic cardiomyopathy) ([place])   . Obesity   . On home oxygen therapy    "2L; 24/7" (10/11/2016)  . Post menopausal syndrome   . Shortness of breath 07/14/2017  . Skin rash 07/12/2018  . Sleep apnea    pt denies 04/12/2013   Review of Systems:  ROS negative except as per HPI.  Physical Exam: Vitals:   10/08/18 1359  BP: (!) 119/53  Pulse: 100  Temp: (!) 97.4 F (36.3 C)  TempSrc: Oral  SpO2: 99%  Weight: 205 lb 3.2 oz (93.1 kg)  Height: 5' 1"  (1.549 m)   General: A/O x4, in no acute distress, afebrile, nondiaphoretic Cardio: RRR, no mrg's Pulmonary: CTA bilaterally MSK: BLE nontender, nonedematous, hyperkeratosis greatly improved/resolved  Assessment & Plan:   See Encounters Tab for problem based charting.  Patient discussed with Dr. Eppie Gibson

## 2018-10-08 NOTE — Patient Instructions (Signed)
FOLLOW-UP INSTRUCTIONS When: 3 months For: Routine visit What to bring: All of your medications  I have not made any changes to your medications today.   Today we discussed your diabetes, need to decrease the sweet food and candy intake. Your positive weight loss. Your stable blood pressure and your history of COPD. I will notify you by phone of any potential changes today based on the results of your lab work.   Thank you for your visit to the [facility] today. If you have any questions or concerns please call us at [phone].

## 2018-10-09 ENCOUNTER — Encounter: Payer: Self-pay | Admitting: Internal Medicine

## 2018-10-09 NOTE — Assessment & Plan Note (Signed)
HTN: BP today well controlled at 119/53. She endorses medication adherence.   Plan:  Continue Lisinopril 40mg  daily Continue Spironolactone 25mg  daily Continue Metoprolol 100mg  daily

## 2018-10-09 NOTE — Assessment & Plan Note (Signed)
COPD: Most recent PFT's in 2016 w/ FEV1/FVC ratio 102% predicted, FVC 50% predicted, TLC 158% predicted, DLCO is 69% predicted I am uncertain of the significance and would recommend repeat testing and consideration of treatment deescalation.   Plan:  Ordered PFT's  Will consider medication change following PFT completion. I do not feel that the ICS is indicated at this time but would like to see a more accurate set of PFT's prior to altering her currently at least moderately stable regimen.

## 2018-10-09 NOTE — Assessment & Plan Note (Signed)
  DMII: Patient was recently initiated on Synjardy and victoza in an attempt to discontinue her insulin due to compliance, hypoglycemic and treatment failure concerns. She endorses compliance with this regimen and notes that it is much easier to follow. In addition, she denied polyuria, polydipsia, dizziness, lightheadedness, weakness, fatigue or other concerns today.  Plan: A1c today 9.3%, improved from 12.7% Continue Synjardy 5-1000mg  BID Continue Victoza 1.8mg  Daily

## 2018-10-09 NOTE — Assessment & Plan Note (Signed)
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 Linked to betamethasone cream refill.

## 2018-10-09 NOTE — Assessment & Plan Note (Signed)
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 It has cleared with steroid treatment from the feet since our last meeting but remains present on her hands bilaterally. Unfortunately the biopsy of the hand was not more definitive. We will  to [facility] dermatology as her two prior referrals were rejected by Dermatology due to incompatible insurance concerns.  I fear there is a component of medication application noncompliance given her description of the manner in which she applies the topical steroid. I have routinely reinforced to her that she should apply this directly to the affected areas twice daily after cleaning the hands and not to apply a lotion or other cream or Vaseline prior to this.   Plan:  Referral to [facility], hopefully they can better assist with a final diagnosis. Refilled the betamethasone dipropionate 0.05% cream BID to her hands

## 2018-10-10 ENCOUNTER — Encounter: Payer: Medicare Other | Admitting: Internal Medicine

## 2018-10-15 NOTE — Progress Notes (Signed)
Case discussed with Dr. Berline Lopes soon after the resident saw the patient.  We reviewed the resident's history and exam and pertinent patient test results.  I agree with the assessment, diagnosis and plan of care documented in the resident's note.  As a point of clarification, she was referred to [facility]ology, no Rheumatology.

## 2018-10-26 ENCOUNTER — Ambulatory Visit (HOSPITAL_COMMUNITY)
Admission: RE | Admit: 2018-10-26 | Discharge: 2018-10-26 | Disposition: A | Discharge status: Home / Self Care | Payer: Medicare Other | Source: Ambulatory Visit | Attending: Internal Medicine | Admitting: Internal Medicine

## 2018-10-26 DIAGNOSIS — J449 Chronic obstructive pulmonary disease, unspecified: Secondary | ICD-10-CM | POA: Diagnosis not present

## 2018-10-26 LAB — PULMONARY FUNCTION TEST
DL/VA % pred: 107 %
DL/VA: 4.71 ml/min/mmHg/L
DLCO unc % pred: 55 %
DLCO unc: 11.16 ml/min/mmHg
FEF 25-75 Post: 0.96 L/sec
FEF 25-75 Pre: 1.04 L/sec
FEF2575-%Change-Post: -8 %
FEF2575-%Pred-Post: 57 %
FEF2575-%Pred-Pre: 62 %
FEV1-%Change-Post: 0 %
FEV1-%Pred-Post: 64 %
FEV1-%Pred-Pre: 65 %
FEV1-Post: 1.1 L
FEV1-Pre: 1.11 L
FEV1FVC-%Change-Post: 0 %
FEV1FVC-%Pred-Pre: 100 %
FEV6-%Change-Post: 0 %
FEV6-%Pred-Post: 66 %
FEV6-%Pred-Pre: 66 %
FEV6-Post: 1.39 L
FEV6-Pre: 1.39 L
FEV6FVC-%Change-Post: 0 %
FEV6FVC-%Pred-Post: 104 %
FEV6FVC-%Pred-Pre: 103 %
FVC-%Change-Post: 0 %
FVC-%Pred-Post: 63 %
FVC-%Pred-Pre: 63 %
FVC-Post: 1.4 L
FVC-Pre: 1.4 L
Post FEV1/FVC ratio: 79 %
Post FEV6/FVC ratio: 100 %
Pre FEV1/FVC ratio: 79 %
Pre FEV6/FVC Ratio: 99 %
RV % pred: 99 %
RV: 1.92 L
TLC % pred: 74 %
TLC: 3.43 L

## 2018-10-26 MED ORDER — ALBUTEROL SULFATE (2.5 MG/3ML) 0.083% IN NEBU
2.5000 mg | INHALATION_SOLUTION | Freq: Once | RESPIRATORY_TRACT | Status: AC
  Administered 2018-10-26: 2.5 mg via RESPIRATORY_TRACT

## 2018-10-29 DIAGNOSIS — J449 Chronic obstructive pulmonary disease, unspecified: Secondary | ICD-10-CM | POA: Diagnosis not present

## 2018-10-31 DIAGNOSIS — H2511 Age-related nuclear cataract, right eye: Secondary | ICD-10-CM | POA: Diagnosis not present

## 2018-10-31 DIAGNOSIS — H25811 Combined forms of age-related cataract, right eye: Secondary | ICD-10-CM | POA: Diagnosis not present

## 2018-11-06 ENCOUNTER — Encounter: Payer: Self-pay | Admitting: Internal Medicine

## 2018-11-13 ENCOUNTER — Inpatient Hospital Stay (HOSPITAL_COMMUNITY)
Admission: EM | Admit: 2018-11-13 | Discharge: 2018-11-16 | DRG: 291 | Disposition: A | Discharge status: Home / Self Care | Payer: Medicare Other | Attending: Internal Medicine | Admitting: Internal Medicine

## 2018-11-13 ENCOUNTER — Encounter (HOSPITAL_COMMUNITY): Payer: Self-pay | Admitting: *Deleted

## 2018-11-13 ENCOUNTER — Other Ambulatory Visit: Payer: Self-pay

## 2018-11-13 ENCOUNTER — Emergency Department (HOSPITAL_COMMUNITY): Payer: Medicare Other

## 2018-11-13 DIAGNOSIS — L409 Psoriasis, unspecified: Secondary | ICD-10-CM | POA: Diagnosis not present

## 2018-11-13 DIAGNOSIS — I5042 Chronic combined systolic (congestive) and diastolic (congestive) heart failure: Secondary | ICD-10-CM | POA: Diagnosis present

## 2018-11-13 DIAGNOSIS — I5020 Unspecified systolic (congestive) heart failure: Secondary | ICD-10-CM | POA: Diagnosis present

## 2018-11-13 DIAGNOSIS — J9691 Respiratory failure, unspecified with hypoxia: Secondary | ICD-10-CM | POA: Diagnosis not present

## 2018-11-13 DIAGNOSIS — I428 Other cardiomyopathies: Secondary | ICD-10-CM | POA: Diagnosis present

## 2018-11-13 DIAGNOSIS — G4730 Sleep apnea, unspecified: Secondary | ICD-10-CM | POA: Diagnosis not present

## 2018-11-13 DIAGNOSIS — J441 Chronic obstructive pulmonary disease with (acute) exacerbation: Secondary | ICD-10-CM | POA: Diagnosis present

## 2018-11-13 DIAGNOSIS — I10 Essential (primary) hypertension: Secondary | ICD-10-CM | POA: Diagnosis not present

## 2018-11-13 DIAGNOSIS — T380X5A Adverse effect of glucocorticoids and synthetic analogues, initial encounter: Secondary | ICD-10-CM | POA: Diagnosis not present

## 2018-11-13 DIAGNOSIS — Z886 Allergy status to analgesic agent status: Secondary | ICD-10-CM | POA: Diagnosis not present

## 2018-11-13 DIAGNOSIS — R0989 Other specified symptoms and signs involving the circulatory and respiratory systems: Secondary | ICD-10-CM | POA: Diagnosis not present

## 2018-11-13 DIAGNOSIS — Z888 Allergy status to other drugs, medicaments and biological substances status: Secondary | ICD-10-CM

## 2018-11-13 DIAGNOSIS — Z7984 Long term (current) use of oral hypoglycemic drugs: Secondary | ICD-10-CM

## 2018-11-13 DIAGNOSIS — E119 Type 2 diabetes mellitus without complications: Secondary | ICD-10-CM | POA: Diagnosis not present

## 2018-11-13 DIAGNOSIS — Z823 Family history of stroke: Secondary | ICD-10-CM

## 2018-11-13 DIAGNOSIS — Z7951 Long term (current) use of inhaled steroids: Secondary | ICD-10-CM

## 2018-11-13 DIAGNOSIS — Z9981 Dependence on supplemental oxygen: Secondary | ICD-10-CM | POA: Diagnosis not present

## 2018-11-13 DIAGNOSIS — Z794 Long term (current) use of insulin: Secondary | ICD-10-CM

## 2018-11-13 DIAGNOSIS — R0902 Hypoxemia: Secondary | ICD-10-CM

## 2018-11-13 DIAGNOSIS — Z833 Family history of diabetes mellitus: Secondary | ICD-10-CM

## 2018-11-13 DIAGNOSIS — R0602 Shortness of breath: Secondary | ICD-10-CM | POA: Diagnosis not present

## 2018-11-13 DIAGNOSIS — F17210 Nicotine dependence, cigarettes, uncomplicated: Secondary | ICD-10-CM | POA: Diagnosis present

## 2018-11-13 DIAGNOSIS — Z7982 Long term (current) use of aspirin: Secondary | ICD-10-CM

## 2018-11-13 DIAGNOSIS — J449 Chronic obstructive pulmonary disease, unspecified: Secondary | ICD-10-CM

## 2018-11-13 DIAGNOSIS — I5032 Chronic diastolic (congestive) heart failure: Secondary | ICD-10-CM | POA: Diagnosis present

## 2018-11-13 DIAGNOSIS — I700 Atherosclerosis of aorta: Secondary | ICD-10-CM | POA: Diagnosis present

## 2018-11-13 DIAGNOSIS — I110 Hypertensive heart disease with heart failure: Secondary | ICD-10-CM | POA: Diagnosis not present

## 2018-11-13 DIAGNOSIS — J90 Pleural effusion, not elsewhere classified: Secondary | ICD-10-CM | POA: Diagnosis not present

## 2018-11-13 DIAGNOSIS — Z95810 Presence of automatic (implantable) cardiac defibrillator: Secondary | ICD-10-CM

## 2018-11-13 DIAGNOSIS — E876 Hypokalemia: Secondary | ICD-10-CM | POA: Diagnosis not present

## 2018-11-13 DIAGNOSIS — R0689 Other abnormalities of breathing: Secondary | ICD-10-CM | POA: Diagnosis not present

## 2018-11-13 DIAGNOSIS — Z8249 Family history of ischemic heart disease and other diseases of the circulatory system: Secondary | ICD-10-CM

## 2018-11-13 DIAGNOSIS — R000 Tachycardia, unspecified: Secondary | ICD-10-CM | POA: Diagnosis not present

## 2018-11-13 DIAGNOSIS — J9601 Acute respiratory failure with hypoxia: Secondary | ICD-10-CM | POA: Diagnosis not present

## 2018-11-13 DIAGNOSIS — E785 Hyperlipidemia, unspecified: Secondary | ICD-10-CM | POA: Diagnosis not present

## 2018-11-13 DIAGNOSIS — Z79899 Other long term (current) drug therapy: Secondary | ICD-10-CM

## 2018-11-13 DIAGNOSIS — I5023 Acute on chronic systolic (congestive) heart failure: Secondary | ICD-10-CM | POA: Diagnosis present

## 2018-11-13 DIAGNOSIS — I5043 Acute on chronic combined systolic (congestive) and diastolic (congestive) heart failure: Secondary | ICD-10-CM | POA: Diagnosis present

## 2018-11-13 DIAGNOSIS — I5022 Chronic systolic (congestive) heart failure: Secondary | ICD-10-CM | POA: Diagnosis present

## 2018-11-13 DIAGNOSIS — Z9119 Patient's noncompliance with other medical treatment and regimen: Secondary | ICD-10-CM

## 2018-11-13 LAB — BASIC METABOLIC PANEL
Anion gap: 11 (ref 5–15)
BUN: 13 mg/dL (ref 8–23)
CO2: 23 mmol/L (ref 22–32)
Calcium: 9.2 mg/dL (ref 8.9–10.3)
Chloride: 107 mmol/L (ref 98–111)
Creatinine, Ser: 0.8 mg/dL (ref 0.44–1.00)
GFR calc Af Amer: >60 mL/min (ref >60)
GFR calc non Af Amer: >60 mL/min (ref >60)
Glucose, Bld: 222 mg/dL — ABNORMAL HIGH (ref 70–99)
Potassium: 3.3 mmol/L — ABNORMAL LOW (ref 3.5–5.1)
Sodium: 141 mmol/L (ref 135–145)

## 2018-11-13 LAB — INFLUENZA PANEL BY PCR (TYPE A & B)
Influenza A By PCR: NEGATIVE
Influenza B By PCR: NEGATIVE

## 2018-11-13 LAB — CBC WITH DIFFERENTIAL/PLATELET
Abs Immature Granulocytes: 0.02 10*3/uL (ref 0.00–0.07)
Basophils Absolute: 0.1 10*3/uL (ref 0.0–0.1)
Basophils Relative: 1 %
Eosinophils Absolute: 0.3 10*3/uL (ref 0.0–0.5)
Eosinophils Relative: 4 %
HCT: 44.3 % (ref 36.0–46.0)
Hemoglobin: 13.4 g/dL (ref 12.0–15.0)
Immature Granulocytes: 0 %
Lymphocytes Relative: 21 %
Lymphs Abs: 1.9 10*3/uL (ref 0.7–4.0)
MCH: 26.4 pg (ref 26.0–34.0)
MCHC: 30.2 g/dL (ref 30.0–36.0)
MCV: 87.4 fL (ref 80.0–100.0)
Monocytes Absolute: 0.6 10*3/uL (ref 0.1–1.0)
Monocytes Relative: 7 %
Neutro Abs: 6.3 10*3/uL (ref 1.7–7.7)
Neutrophils Relative %: 67 %
Platelets: 209 10*3/uL (ref 150–400)
RBC: 5.07 MIL/uL (ref 3.87–5.11)
RDW: 16.1 % — ABNORMAL HIGH (ref 11.5–15.5)
WBC: 9.2 10*3/uL (ref 4.0–10.5)
nRBC: 0 % (ref 0.0–0.2)

## 2018-11-13 LAB — HIV ANTIBODY (ROUTINE TESTING W REFLEX): HIV Screen 4th Generation wRfx: NONREACTIVE

## 2018-11-13 LAB — CBG MONITORING, ED: Glucose-Capillary: 391 mg/dL — ABNORMAL HIGH (ref 70–99)

## 2018-11-13 LAB — GLUCOSE, CAPILLARY
Glucose-Capillary: 402 mg/dL — ABNORMAL HIGH (ref 70–99)
Glucose-Capillary: 248 mg/dL — ABNORMAL HIGH (ref 70–99)
Glucose-Capillary: 164 mg/dL — ABNORMAL HIGH (ref 70–99)

## 2018-11-13 LAB — I-STAT TROPONIN, ED: Troponin i, poc: 0.07 ng/mL (ref 0.00–0.08)

## 2018-11-13 LAB — MAGNESIUM: Magnesium: 1.8 mg/dL (ref 1.7–2.4)

## 2018-11-13 LAB — BRAIN NATRIURETIC PEPTIDE: B Natriuretic Peptide: 124.7 pg/mL — ABNORMAL HIGH (ref 0.0–100.0)

## 2018-11-13 MED ORDER — METOPROLOL SUCCINATE ER 100 MG PO TB24
100.0000 mg | ORAL_TABLET | Freq: Every day | ORAL | Status: DC
  Administered 2018-11-13 – 2018-11-16 (×4): 100 mg via ORAL
  Filled 2018-11-13: qty 4
  Filled 2018-11-13 (×3): qty 1

## 2018-11-13 MED ORDER — METHYLPREDNISOLONE SODIUM SUCC 125 MG IJ SOLR
125.0000 mg | Freq: Once | INTRAMUSCULAR | Status: AC
  Administered 2018-11-13: 125 mg via INTRAVENOUS
  Filled 2018-11-13: qty 2

## 2018-11-13 MED ORDER — FUROSEMIDE 80 MG PO TABS
80.0000 mg | ORAL_TABLET | Freq: Two times a day (BID) | ORAL | Status: DC
  Administered 2018-11-14 – 2018-11-15 (×3): 80 mg via ORAL
  Filled 2018-11-13 (×3): qty 1

## 2018-11-13 MED ORDER — IPRATROPIUM BROMIDE 0.02 % IN SOLN
0.5000 mg | Freq: Once | RESPIRATORY_TRACT | Status: AC
  Administered 2018-11-13: 0.5 mg via RESPIRATORY_TRACT
  Filled 2018-11-13: qty 2.5

## 2018-11-13 MED ORDER — INSULIN ASPART 100 UNIT/ML ~~LOC~~ SOLN
6.0000 [IU] | Freq: Three times a day (TID) | SUBCUTANEOUS | Status: DC
  Administered 2018-11-13 – 2018-11-16 (×9): 6 [IU] via SUBCUTANEOUS

## 2018-11-13 MED ORDER — FUROSEMIDE 20 MG PO TABS
80.0000 mg | ORAL_TABLET | Freq: Two times a day (BID) | ORAL | Status: DC
  Administered 2018-11-13: 80 mg via ORAL
  Filled 2018-11-13: qty 4

## 2018-11-13 MED ORDER — ALBUTEROL SULFATE (2.5 MG/3ML) 0.083% IN NEBU
5.0000 mg | INHALATION_SOLUTION | Freq: Once | RESPIRATORY_TRACT | Status: AC
  Administered 2018-11-13: 5 mg via RESPIRATORY_TRACT
  Filled 2018-11-13: qty 6

## 2018-11-13 MED ORDER — SPIRONOLACTONE 12.5 MG HALF TABLET
12.5000 mg | ORAL_TABLET | Freq: Every day | ORAL | Status: DC
  Administered 2018-11-13 – 2018-11-16 (×4): 12.5 mg via ORAL
  Filled 2018-11-13 (×4): qty 1

## 2018-11-13 MED ORDER — MOMETASONE FURO-FORMOTEROL FUM 100-5 MCG/ACT IN AERO
2.0000 | INHALATION_SPRAY | Freq: Two times a day (BID) | RESPIRATORY_TRACT | Status: DC
  Administered 2018-11-13 – 2018-11-15 (×5): 2 via RESPIRATORY_TRACT
  Filled 2018-11-13: qty 8.8

## 2018-11-13 MED ORDER — POTASSIUM CHLORIDE CRYS ER 20 MEQ PO TBCR
40.0000 meq | EXTENDED_RELEASE_TABLET | Freq: Once | ORAL | Status: AC
  Administered 2018-11-13: 40 meq via ORAL
  Filled 2018-11-13: qty 2

## 2018-11-13 MED ORDER — LISINOPRIL 40 MG PO TABS
40.0000 mg | ORAL_TABLET | Freq: Every day | ORAL | Status: DC
  Administered 2018-11-13 – 2018-11-16 (×4): 40 mg via ORAL
  Filled 2018-11-13: qty 2
  Filled 2018-11-13 (×2): qty 1
  Filled 2018-11-13: qty 2
  Filled 2018-11-13: qty 1

## 2018-11-13 MED ORDER — ENOXAPARIN SODIUM 40 MG/0.4ML ~~LOC~~ SOLN
40.0000 mg | SUBCUTANEOUS | Status: DC
  Administered 2018-11-13 – 2018-11-16 (×4): 40 mg via SUBCUTANEOUS
  Filled 2018-11-13 (×5): qty 0.4

## 2018-11-13 MED ORDER — AZITHROMYCIN 250 MG PO TABS
250.0000 mg | ORAL_TABLET | Freq: Every day | ORAL | Status: DC
  Administered 2018-11-14: 250 mg via ORAL
  Filled 2018-11-13: qty 1

## 2018-11-13 MED ORDER — INSULIN ASPART 100 UNIT/ML ~~LOC~~ SOLN
4.0000 [IU] | Freq: Three times a day (TID) | SUBCUTANEOUS | Status: DC
  Administered 2018-11-13: 4 [IU] via SUBCUTANEOUS

## 2018-11-13 MED ORDER — FUROSEMIDE 10 MG/ML IJ SOLN
60.0000 mg | Freq: Once | INTRAMUSCULAR | Status: AC
  Administered 2018-11-13: 60 mg via INTRAVENOUS
  Filled 2018-11-13: qty 6

## 2018-11-13 MED ORDER — INSULIN GLARGINE 100 UNIT/ML ~~LOC~~ SOLN
10.0000 [IU] | Freq: Every day | SUBCUTANEOUS | Status: DC
  Administered 2018-11-13: 10 [IU] via SUBCUTANEOUS
  Filled 2018-11-13 (×2): qty 0.1

## 2018-11-13 MED ORDER — IPRATROPIUM-ALBUTEROL 0.5-2.5 (3) MG/3ML IN SOLN: 3.0000 mL | Freq: Once | RESPIRATORY_TRACT | Status: DC

## 2018-11-13 MED ORDER — PREDNISONE 20 MG PO TABS
40.0000 mg | ORAL_TABLET | Freq: Every day | ORAL | Status: DC
  Administered 2018-11-13 – 2018-11-14 (×2): 40 mg via ORAL
  Filled 2018-11-13 (×2): qty 2

## 2018-11-13 MED ORDER — INSULIN ASPART 100 UNIT/ML ~~LOC~~ SOLN: 0.0000 [IU] | Freq: Every day | SUBCUTANEOUS | Status: DC

## 2018-11-13 MED ORDER — ATORVASTATIN CALCIUM 80 MG PO TABS
80.0000 mg | ORAL_TABLET | Freq: Every day | ORAL | Status: DC
  Administered 2018-11-13 – 2018-11-15 (×3): 80 mg via ORAL
  Filled 2018-11-13 (×3): qty 1

## 2018-11-13 MED ORDER — AZITHROMYCIN 250 MG PO TABS
500.0000 mg | ORAL_TABLET | Freq: Every day | ORAL | Status: AC
  Administered 2018-11-13: 500 mg via ORAL
  Filled 2018-11-13: qty 2

## 2018-11-13 MED ORDER — INSULIN ASPART 100 UNIT/ML ~~LOC~~ SOLN
0.0000 [IU] | Freq: Three times a day (TID) | SUBCUTANEOUS | Status: DC
  Administered 2018-11-13 (×2): 20 [IU] via SUBCUTANEOUS
  Administered 2018-11-13: 7 [IU] via SUBCUTANEOUS
  Administered 2018-11-14: 4 [IU] via SUBCUTANEOUS
  Administered 2018-11-14: 7 [IU] via SUBCUTANEOUS
  Administered 2018-11-14: 3 [IU] via SUBCUTANEOUS
  Administered 2018-11-15: 15 [IU] via SUBCUTANEOUS
  Administered 2018-11-15: 4 [IU] via SUBCUTANEOUS
  Administered 2018-11-15: 3 [IU] via SUBCUTANEOUS
  Administered 2018-11-16: 11 [IU] via SUBCUTANEOUS

## 2018-11-13 NOTE — ED Notes (Signed)
Breakfast Tray Ordered. 

## 2018-11-13 NOTE — ED Notes (Signed)
PT ambulated steady with no gait. 02 dropped to 88%.

## 2018-11-13 NOTE — ED Notes (Signed)
Ambulated patient, patient complained of becoming short of breath.  Observed patient walk with steady gait, O2 88%, heart rate 98.

## 2018-11-13 NOTE — ED Provider Notes (Signed)
TIME SEEN: 1:16 AM  CHIEF COMPLAINT: Shortness of breath  HPI: Patient is a 68 year old female with history of CHF status post AICD, COPD who is not wearing oxygen at home hypertension, diabetes, hyperlipidemia who presents to the emergency department with shortness of breath that started tonight.  States she was wheezing and this improved after breathing treatments with EMS.  Has had dry cough.  No fever.  Had some chest soreness that is now gone.  She is a smoker.  States she is supposed to wear oxygen at night but has not been.  She has oxygen at home.  Feels better after breathing treatments.  ROS: See HPI Constitutional: no fever  Eyes: no drainage  ENT: no runny nose   Cardiovascular:   chest pain  Resp:  SOB  GI: no vomiting GU: no dysuria Integumentary: no rash  Allergy: no hives  Musculoskeletal: no leg swelling  Neurological: no slurred speech ROS otherwise negative  PAST MEDICAL HISTORY/PAST SURGICAL HISTORY:  Past Medical History:  Diagnosis Date  . ATN (acute tubular necrosis) ([place]) 07/15/2014  . Automatic implantable cardioverter-defibrillator in situ   . Automatic implantable cardioverter-defibrillator in situ 05/04/2007   Qualifier: Diagnosis of  By: Isla Pence    . Cardiac defibrillator in situ    Atlas II VR (SJM) implanted by Dr Lovena Le  . CHF (congestive heart failure) ([place])   . Chronic combined systolic and diastolic heart failure (HCC)    a. EF 35-40% in past;  b. Echo 7/13:  EF 45-50%, Gr 2 diast dysfn, mild AI, mild MAC, trivial MR, mild LAE, PASP 47.  [name] Chronic ulcer of leg ([place])    04-09-15 resolved-not a problem.  . Colon polyps 04/12/2013   Rectosigmoid polyp  . COPD (chronic obstructive pulmonary disease) (HCC)    O2 at night  . Depression   . Diabetes mellitus   . Diabetes mellitus, type 2 ([place]) 04/03/2012   HIGH RISK FEET.. Please have patient take shoes and socks off every visit for visual foot inspection.    . Eczema   . Elevated alkaline  phosphatase level    GGT and 5'nucleotidase 8/13 normal  . Health care maintenance 01/22/2013   Surgically absent cervix- no pap needed (Path report 07/2000: uterine body with attached bilateral adnexa and separate cervix.)  . History of oxygen administration    oxygen @ 2 l/m nasally bedtime 24/7  . HTN (hypertension)   . Hx of cardiac cath    a. [place] 2003 normal;  b. LHC 6/13:  Mild calcification in the LM, o/w normal coronary arteries, EF 45%.   . Hyperkalemia 08/08/2017  . Hyperlipidemia   . Hyperlipidemia associated with type 2 diabetes mellitus ([place]) 05/25/2007   Qualifier: Diagnosis of  By: Hassell Done FNP, Tori Milks    . Hyperthyroidism, subclinical   . Implantable cardioverter-defibrillator (ICD) generator end of life   . Lipoma   . NICM (nonischemic cardiomyopathy) ([place])   . Obesity   . On home oxygen therapy    "2L; 24/7" (10/11/2016)  . Post menopausal syndrome   . Shortness of breath 07/14/2017  . Skin rash 07/12/2018  . Sleep apnea    pt denies 04/12/2013    MEDICATIONS:  Prior to Admission medications   Medication Sig Start Date End Date Taking? Authorizing Provider  albuterol (PROVENTIL) (2.5 MG/3ML) 0.083% nebulizer solution USE 1 VIAL VIA NEBULIZER EVERY 6 HOURS AS NEEDED FOR WHEEZING OR SHORTNESS OF BREATH 11/22/16   Florinda Marker, MD  aspirin EC  81 MG tablet Take 1 tablet (81 mg total) by mouth daily. 11/13/15   Burns, Arloa Koh, MD  atorvastatin (LIPITOR) 80 MG tablet Take 1 tablet (80 mg total) by mouth at bedtime. 06/21/17   Kathi Ludwig, MD  betamethasone dipropionate (DIPROLENE) 0.05 % cream Apply topically 2 (two) times daily. To both hands. 10/08/18   Kathi Ludwig, MD  Blood Glucose Monitoring Suppl (CONTOUR NEXT MONITOR) w/Device KIT 1 Act by Does not apply route 4 (four) times daily. 07/11/18   Kathi Ludwig, MD  Empagliflozin-metFORMIN HCl ER (SYNJARDY XR) 01-999 MG TB24 Take 1 tablet by mouth 2 (two) times daily with a meal. 08/06/18   Kathi Ludwig, MD  Fluticasone-Salmeterol (ADVAIR DISKUS) 250-50 MCG/DOSE AEPB Inhale 1 puff into the lungs 2 (two) times daily. Patient taking differently: Inhale 1 puff into the lungs 2 (two) times daily as needed.  05/31/17 08/23/18  Shela Leff, MD  furosemide (LASIX) 80 MG tablet Take 1 tablet (80 mg total) by mouth 2 (two) times daily. 01/12/18 08/23/18  Velna Ochs, MD  glucose blood (CONTOUR NEXT TEST) test strip Use as instructed 07/11/18   Kathi Ludwig, MD  Lancets Misc. (ACCU-CHEK FASTCLIX LANCET) KIT Lancing device, use with lancets for blood glucose monitoring. 07/17/18   Kathi Ludwig, MD  Lancets MISC 1 Act by Does not apply route 4 (four) times daily. 07/11/18   Kathi Ludwig, MD  liraglutide (VICTOZA) 18 MG/3ML SOPN Inject 0.3 mLs (1.8 mg total) into the skin daily. 08/06/18   Kathi Ludwig, MD  lisinopril (PRINIVIL,ZESTRIL) 40 MG tablet Take 1 tablet (40 mg total) by mouth daily. 01/12/18 05/12/18  Velna Ochs, MD  metoprolol succinate (TOPROL-XL) 50 MG 24 hr tablet Take 2 tablets (100 mg total) by mouth daily. 01/12/18   Velna Ochs, MD  nitroGLYCERIN (NITROSTAT) 0.4 MG SL tablet Place 1 tablet (0.4 mg total) under the tongue every 5 (five) minutes as needed for chest pain. Reported on 03/18/2016 06/27/18   Forde Dandy, PharmD  pantoprazole (PROTONIX) 40 MG tablet Take 1 tablet (40 mg total) by mouth daily. 02/08/17   Burns, Arloa Koh, MD  spironolactone (ALDACTONE) 25 MG tablet TAKE 0.5 TABLETS BY MOUTH EVERY DAY 03/15/18   Kathi Ludwig, MD  tiotropium (SPIRIVA) 18 MCG inhalation capsule Place 1 capsule (18 mcg total) into inhaler and inhale daily. 08/06/18   Forde Dandy, PharmD    ALLERGIES:  Allergies  Allergen Reactions  . Actos [Pioglitazone] Other (See Comments)    REACTION: congestive heart failure   . Naproxen Other (See Comments)    59m dose made her sleep for two days  . Rosiglitazone Hives  . Hydrocortisone Other  (See Comments)    unknown    SOCIAL HISTORY:  Social History   Tobacco Use  . Smoking status: Current Every Day Smoker    Packs/day: 0.10    Years: 39.00    Pack years: 3.90    Types: Cigarettes  . Smokeless tobacco: Never Used  . Tobacco comment: 2 cig/day.  Substance Use Topics  . Alcohol use: No    Alcohol/week: 1.0 standard drinks    Types: 1 Glasses of wine per week    FAMILY HISTORY: Family History  Problem Relation Age of Onset  . Stroke Mother   . Seizures Father   . Heart disease Father   . Diabetes Sister   . Asthma Maternal Aunt        aunts  . Asthma Maternal Uncle  uncles  . Heart disease Paternal Aunt        aunts  . Heart disease Paternal Uncle        uncles  . Heart disease Maternal Aunt        aunts  . Heart disease Maternal Uncle        uncles  . Heart disease Maternal Grandfather   . Colon cancer Neg Hx   . Colon polyps Neg Hx   . Esophageal cancer Neg Hx   . Kidney disease Neg Hx   . Gallbladder disease Neg Hx     EXAM: BP (!) 184/85 (BP Location: Left Arm)   Pulse (!) 109   Temp 97.9 F (36.6 C) (Oral)   Resp (!) 27   SpO2 93%  CONSTITUTIONAL: Alert and oriented and responds appropriately to questions. Well-appearing; well-nourished HEAD: Normocephalic EYES: Conjunctivae clear, pupils appear equal, EOMI ENT: normal nose; moist mucous membranes NECK: Supple, no meningismus, no nuchal rigidity, no LAD  CARD: Regular and tachycardic; S1 and S2 appreciated; no murmurs, no clicks, no rubs, no gallops RESP: Normal chest excursion without splinting patient is tachypneic and speak short sentences, diminished aeration at bases bilaterally but I do not appreciate rhonchi, wheezing or rales currently, sats in the low 90s on room air currently ABD/GI: Normal bowel sounds; non-distended; soft, non-tender, no rebound, no guarding, no peritoneal signs, no hepatosplenomegaly BACK:  The back appears normal and is non-tender to palpation, there  is no CVA tenderness EXT: Normal ROM in all joints; non-tender to palpation; no edema; normal capillary refill; no cyanosis, no calf tenderness or swelling    SKIN: Normal color for age and race; warm; no rash NEURO: Moves all extremities equally PSYCH: The patient's mood and manner are appropriate. Grooming and personal hygiene are appropriate.  MEDICAL DECISION MAKING: Patient here with shortness of breath.  Suspect COPD exacerbation.  Does not appear volume overloaded but will obtain chest x-ray, BNP.  She complaining of chest soreness that has resolved.  Low suspicion for ACS.  EKG shows no ischemic abnormality.  Will obtain troponin.  Doubt PE.  Doubt dissection.  No infectious symptoms to suggest pneumonia.  Will give Solu-Medrol.  She still has some diminished aeration on exam and sats between 92 to 93% on room air at rest.  Will give second breathing treatment.  ED PROGRESS: Patient still appears to be short of breath with just sitting upright in the bed talking to me.  She is tachypneic.  She does have some scattered wheezes now.  She seems to be opening up more.  Will give third breathing treatment.   Patient hypoxic on room air at rest while awake.  Sats in the mid to upper 80s.  When she ambulates her sats are 88% and she complains of shortness of breath.  Chest x-ray does not appear to be volume overloaded.  I suspect this is bronchospasm for her COPD.  Will admit to internal medicine.  3:04 AM Discussed patient's case with IM resident.  I have recommended admission and patient (and family if present) agree with this plan. Admitting physician will place admission orders.   I reviewed all nursing notes, vitals, pertinent previous records, EKGs, lab and urine results, imaging (as available).      EKG Interpretation  Date/Time:  Tuesday November 13 2018 01:04:45 EST Ventricular Rate:  111 PR Interval:    QRS Duration: 91 QT Interval:  415 QTC Calculation: 564 R Axis:   75 Text  Interpretation:  Sinus tachycardia Probable left atrial enlargement Borderline T abnormalities, diffuse leads Prolonged QT interval No significant change since last tracing other than rate is faster Confirmed by Sarahgrace Broman, Cyril Mourning [phone]) on 11/13/2018 1:09:40 AM         Phylis Javed, Delice Bison, DO 11/13/18 0304

## 2018-11-13 NOTE — ED Notes (Signed)
Heart Healthy/ Carb Modified Diet was ordered for Lunch. 

## 2018-11-13 NOTE — H&P (Addendum)
Date: 11/13/2018               Patient Name:  [name] MRN: [id number]  DOB: [birth year] Age / Sex: 68 y.o., female   PCP: Kathi Ludwig, MD         Medical Service: Internal Medicine Teaching Service         Attending Physician: Dr. Gilles Chiquito     First Contact: Dr. Sherry Ruffing Pager: [phone]  Second Contact: Dr. Shan Levans Pager: [phone]       After Hours (After 5p/  First Contact Pager: [phone]  weekends / holidays): Second Contact Pager: [phone]   Chief Complaint: Dyspnea  History of Present Illness: [name] is a 68 year old African-American woman with COPD noncompliant with supplemental oxygen (2L), nonischemic cardiomyopathy EF 52-84%, combined systolic and diastolic heart failure with AICD, hypertension, diabetes, hyperlipidemia who presented to [facility] emergency department with progressively worsening dyspnea x 2days.  She was in her usual state of health until 2 days ago when she began experiencing wheezing, chills, rhinorrhea, nasal congestion and nonproductive cough.  She however denies fevers.  She has baseline 2 pillow orthopnea, paroxysmal nocturnal dyspnea, weight gain, lower extremity swelling, palpitation.  She did report chest tightness which at resolved during the time of our evaluation. She reports compliance to all her medications and also endorses compliance with low-salt diet.  ED course: Afebrile, HR 100s, RR 15-30s, BP 140s-180s/60s-80s, SPO2 86-94% on room air.  BNP 127, troponin unremarkable, K+ 3.3.  Patient received 125 mg, Atrovent and albuterol.  Meds:  Current Meds  Medication Sig  . albuterol (PROVENTIL) (2.5 MG/3ML) 0.083% nebulizer solution USE 1 VIAL VIA NEBULIZER EVERY 6 HOURS AS NEEDED FOR WHEEZING OR SHORTNESS OF BREATH (Patient taking differently: Take 2.5 mg by nebulization every 6 (six) hours as needed for wheezing. )  . aspirin EC 81 MG tablet Take 1 tablet (81 mg total) by mouth daily.  [name] atorvastatin (LIPITOR) 80 MG tablet Take  1 tablet (80 mg total) by mouth at bedtime.  . betamethasone dipropionate (DIPROLENE) 0.05 % cream Apply topically 2 (two) times daily. To both hands.  . Empagliflozin-metFORMIN HCl ER (SYNJARDY XR) 01-999 MG TB24 Take 1 tablet by mouth 2 (two) times daily with a meal.  . Fluticasone-Salmeterol (ADVAIR DISKUS) 250-50 MCG/DOSE AEPB Inhale 1 puff into the lungs 2 (two) times daily. (Patient taking differently: Inhale 1 puff into the lungs 2 (two) times daily as needed. )  . furosemide (LASIX) 80 MG tablet Take 1 tablet (80 mg total) by mouth 2 (two) times daily.  [name] liraglutide (VICTOZA) 18 MG/3ML SOPN Inject 0.3 mLs (1.8 mg total) into the skin daily.  [name] lisinopril (PRINIVIL,ZESTRIL) 40 MG tablet Take 1 tablet (40 mg total) by mouth daily.  . metoprolol succinate (TOPROL-XL) 50 MG 24 hr tablet Take 2 tablets (100 mg total) by mouth daily.  . nitroGLYCERIN (NITROSTAT) 0.4 MG SL tablet Place 1 tablet (0.4 mg total) under the tongue every 5 (five) minutes as needed for chest pain. Reported on 03/18/2016  . pantoprazole (PROTONIX) 40 MG tablet Take 1 tablet (40 mg total) by mouth daily.  [name] spironolactone (ALDACTONE) 25 MG tablet TAKE 0.5 TABLETS BY MOUTH EVERY DAY (Patient taking differently: Take 12.5 mg by mouth daily. )  . tiotropium (SPIRIVA) 18 MCG inhalation capsule Place 1 capsule (18 mcg total) into inhaler and inhale daily.     Allergies: Allergies as of 11/13/2018 - Review Complete 11/13/2018  Allergen Reaction Noted  .  Actos [pioglitazone] Other (See Comments) 03/12/2009  . Naproxen Other (See Comments) 02/05/2016  . Rosiglitazone Hives 02/11/2016  . Hydrocortisone Other (See Comments) 10/06/2009   Past Medical History:  Diagnosis Date  . ATN (acute tubular necrosis) ([place]) 07/15/2014  . Automatic implantable cardioverter-defibrillator in situ   . Automatic implantable cardioverter-defibrillator in situ 05/04/2007   Qualifier: Diagnosis of  By: Isla Pence    . Cardiac  defibrillator in situ    Atlas II VR (SJM) implanted by Dr Lovena Le  . CHF (congestive heart failure) ([place])   . Chronic combined systolic and diastolic heart failure (HCC)    a. EF 35-40% in past;  b. Echo 7/13:  EF 45-50%, Gr 2 diast dysfn, mild AI, mild MAC, trivial MR, mild LAE, PASP 47.  [name] Chronic ulcer of leg ([place])    04-09-15 resolved-not a problem.  . Colon polyps 04/12/2013   Rectosigmoid polyp  . COPD (chronic obstructive pulmonary disease) (HCC)    O2 at night  . Depression   . Diabetes mellitus   . Diabetes mellitus, type 2 ([place]) 04/03/2012   HIGH RISK FEET.. Please have patient take shoes and socks off every visit for visual foot inspection.    . Eczema   . Elevated alkaline phosphatase level    GGT and 5'nucleotidase 8/13 normal  . Health care maintenance 01/22/2013   Surgically absent cervix- no pap needed (Path report 07/2000: uterine body with attached bilateral adnexa and separate cervix.)  . History of oxygen administration    oxygen @ 2 l/m nasally bedtime 24/7  . HTN (hypertension)   . Hx of cardiac cath    a. [place] 2003 normal;  b. LHC 6/13:  Mild calcification in the LM, o/w normal coronary arteries, EF 45%.   . Hyperkalemia 08/08/2017  . Hyperlipidemia   . Hyperlipidemia associated with type 2 diabetes mellitus ([place]) 05/25/2007   Qualifier: Diagnosis of  By: Hassell Done FNP, Tori Milks    . Hyperthyroidism, subclinical   . Implantable cardioverter-defibrillator (ICD) generator end of life   . Lipoma   . NICM (nonischemic cardiomyopathy) ([place])   . Obesity   . On home oxygen therapy    "2L; 24/7" (10/11/2016)  . Post menopausal syndrome   . Shortness of breath 07/14/2017  . Skin rash 07/12/2018  . Sleep apnea    pt denies 04/12/2013    Family History: Mother with stroke, father with seizure and heart disease.  Social History: Continues to smoke 1 pack of cigarettes which lasts for about 2 days.  Has been smoking for greater than 30 years.  Endorses occasional EtOH use  and denies illicit drug use.  Review of Systems: A complete ROS was negative except as per HPI.   Physical Exam: Blood pressure (!) 146/68, pulse (!) 111, temperature 97.9 F (36.6 C), temperature source Oral, resp. rate (!) 26, SpO2 94 %. Physical Exam Vitals signs and nursing note reviewed.  Constitutional:      General: She is not in acute distress.    Appearance: She is well-developed. She is not ill-appearing, toxic-appearing or diaphoretic.  HENT:     Head: Normocephalic and atraumatic.  Neck:     Musculoskeletal: Neck supple.  Cardiovascular:     Rate and Rhythm: Regular rhythm. Tachycardia present.     Heart sounds: No murmur. No friction rub. No gallop.   Pulmonary:     Effort: Tachypnea present. No accessory muscle usage.     Breath sounds: No decreased breath sounds, wheezing, rhonchi or  rales.     Comments: -Decreased inspiratory effort.  No evidence of wheezes, crackles, rhonchi. Chest:     Chest wall: No mass, tenderness or edema.  Abdominal:     General: Bowel sounds are normal.     Palpations: Abdomen is soft.     Tenderness: There is no abdominal tenderness.  Musculoskeletal:     Right lower leg: No edema.     Left lower leg: No edema.  Skin:    General: Skin is warm.  Neurological:     General: No focal deficit present.     Mental Status: She is alert and oriented to person, place, and time.     Motor: No weakness.  Psychiatric:        Mood and Affect: Mood normal.        Behavior: Behavior normal.     EKG: personally reviewed my interpretation is tachycardia with prolonged QTC.  CXR: personally reviewed my interpretation is suggestive of vascular congestion  Assessment & Plan by Problem: Active Problems:   COPD exacerbation ([place])  Ms. Daoud is a 68 year old African-American woman with COPD noncompliant with supplemental oxygen (2L), NICM EF 25-30%, cCHF with AICD, hypertension, diabetes, hyperlipidemia here with mild exacerbation of  COPD.  #Acute hypoxic respiratory failure #Mild COPD exacerbation Patient presents with progressively worsening dyspnea, wheezing and nonproductive cough for 2 days.  She describes what seems to be upper respiratory viral symptoms.  Noncompliant with home supplemental oxygen though reports compliance with inhalers.  While in the emergency department, was noted to be tachypneic to the 30s, hypoxic to 87% on nasal cannula, and tachycardic to the 110s.  Symptoms resolved with Solu-Medrol and inhalers. -s/p Solu-Medrol 125 mg, albuterol and ipratropium -Continue prednisone 67m daily x 4 days -Continue Dulera -Influenza PCR negative  -Obtain ambulatory pulse ox  #Nonischemic cardiomyopathy EF 25-30% #cCHF with AICD Does not seem to be in acute heart failure exacerbation and she denies orthopnea, paroxysmal internal dyspnea, lower extremity edema.  No weight recorded on admission.  [facility] and weight recorded was 205 lbs (93 kg).  Though BNP is mildly elevated and chest x-ray suggests vascular congestion, this does not correlate with clinical exam -Daily weights -Strict intake and output -Can resume home Lasix 80 mg BID  #Hypertension -Continue lisinopril, metoprolol, Aldactone  #Diabetes mellitus Last A1c 9.3% (down from 12.7%) -Continue NovoLog 4U TID w/ meals, SSI w/ bedtime correction -Home meds include Victoza 1.8 mg daily, Synjardy 5-10050mBID  #Hyperlipidemia -Continue Lipitor 80 mg daily  #Hypokalemia -Repleted with K-Dur 40 mEq -Follow BMP  FEN: Replace electrolytes as needed, heart healthy/carb modified diet VTE ppx: Subcutaneous Lovenox CODE STATUS: Full code  Dispo: Admit patient to Inpatient with expected length of stay greater than 2 midnights.  Signed: AgJean RosenthalMD 11/13/2018, 3:46 AM  Pager: [phone] PGY-1

## 2018-11-13 NOTE — ED Notes (Signed)
Paged MD notifying that CBG elevated. Also paged diabetic coordinator to verify 20 units of insulin is appropriate dose for CBG 391. RN received Confirmation that medication can be given safely.

## 2018-11-13 NOTE — ED Triage Notes (Signed)
Pt arrived by [facility] from home for sob. Pt did not take her meds this am. Has been without her home nebs x 1 month, only using her inhaler. Received 5mg  combivent pta with relief. spo2 100% on arrival. Hx of chf.

## 2018-11-13 NOTE — Progress Notes (Signed)
Subjective: Ms. Laguna reports that she feels "winded" this morning. The dyspnea mostly occurs when she talks, she was hyperventilating during our evaluation but seemed more comfortable when she was talking. She states that she has oxygen for home, but she doesn't wear it as often as she is supposed to. She states that she takes inhalers for her COPD at home, she had a nebulizer but it broke. She has been in the process of replacing it but the medical equipment place didn't  She reports a new dry cough. She denies sick contacts, fevers, or chills. She reports that she has lost 30 lbs since starting Victoza and decreasing her food intake.   Objective:  Vital signs in last 24 hours: Vitals:   11/13/18 0344 11/13/18 0400 11/13/18 0500 11/13/18 0608  BP:  (!) 144/76 137/69 140/84  Pulse: (!) 112 (!) 107 (!) 111 (!) 124  Resp: 12 11 17  (!) 26  Temp:      TempSrc:      SpO2: 96% 97% 96% 100%    General: lying comfortably in bed, no distress Cardiac: RRR, no m/r/g, JVP 3 cm above clavicle, +HJR Pulmonary: normal effort on 5L supplemental oxygen, bibasilar crackles Abdomen: soft, nondistended, nontender  Extremity: 1+ BL LE edema   Assessment/Plan:  Active Problems:   COPD exacerbation (HCC)  This is a 68 year old female with a history of COPD (noncompliant with home supplemental oxygen), NICM (EF 25-30%), Combined systolic and diastolic HF, HTN, DM, and HLD who presented with worsening DOE x2 days. Associated with wheezing, chills, rhinorrhea, nasal congestion, and nonproductive cough. Found to be tachycardic, hypoxic on RA, BNP 127. She was given atrovent and albuterol and placed on supplemental oxygen.   Hypoxic respiratory failure: COPD exacerbation: -Patient presented with a worsening dyspnea, wheezing, nonproductive cough.  Associated with some upper respiratory viral symptoms.  She had not been using her home oxygen at home and she was found to be hypoxic.  She has improved with  Solu-Medrol, Atrovent and albuterol.  This may be a acute COPD exacerbation.  We will continue a 5-day course of steroids, supplemental oxygen and Dulera.  She is now on nasal cannula at 5 L she is on 3 L chronically at home. -Received solumedrol 125 mg, albuterol and ipratropium -Continue prednisone x4 days, total 5 day course -Continue azothromax x5 days -Continue dulera -Influenza negative -Care management to assist with home nebulizer prescription  NICM: Combined CHF, mild exacerbation (EF 25-30%): -She reports that she has been having worsening shortness of breath, she denies orthopnea, PND or lower extremity edema.  Her weight on January 6 was 205 pounds, no recorded weight at this time.  On our evaluation he does have bibasilar crackles on exam, elevated JVD 3 cm above the clavicle with positive hepatojugular reflux.  She had been taking her Lasix at home however was not compliant with any fluid restriction. On lasix 80 mg BID at home.  -Strict I's and O's -Daily weights -IV Lasix 60 mg today, resume home 80 mg PO BID tomorrow -Fluid restriction  Diabetes mellitus: -Patient's last A1c was 9.3.  She is on Victoza 1.8 mg daily and Synjardy twice daily at home.  We started her on NovoLog 4 units 3 times daily with meals and sliding scale insulin however patient's CBGs have been elevated likely due to her steroids.  We will increase her mealtime insulin and add a basal insulin. -Add Lantus 10 units nightly -Continue mealtime insulin 4 units daily -Sliding-scale  insulin -Frequent CBGs  Hypertension: -Continue lisinopril, metoprolol, aldactone  Hyperlipidemia: -Continue Lipitor 80 mg daily  Hypokalemia: -Continue to trend, replete as needed  FEN: Fluid restriction, replete lytes prn, heart healthy with fluid restriction diet  VTE ppx: Lovenox  Code Status: FULL    Dispo: Anticipated discharge in approximately 1-2 day(s).   Asencion Noble, MD 11/13/2018, 6:50 AM Pager:  [phone]

## 2018-11-14 ENCOUNTER — Inpatient Hospital Stay (HOSPITAL_COMMUNITY): Payer: Medicare Other

## 2018-11-14 LAB — BASIC METABOLIC PANEL
Anion gap: 13 (ref 5–15)
BUN: 32 mg/dL — ABNORMAL HIGH (ref 8–23)
CO2: 23 mmol/L (ref 22–32)
Calcium: 9.3 mg/dL (ref 8.9–10.3)
Chloride: 100 mmol/L (ref 98–111)
Creatinine, Ser: 1.16 mg/dL — ABNORMAL HIGH (ref 0.44–1.00)
GFR calc Af Amer: 57 mL/min — ABNORMAL LOW (ref >60)
GFR calc non Af Amer: 49 mL/min — ABNORMAL LOW (ref >60)
Glucose, Bld: 371 mg/dL — ABNORMAL HIGH (ref 70–99)
Potassium: 4.5 mmol/L (ref 3.5–5.1)
Sodium: 136 mmol/L (ref 135–145)

## 2018-11-14 LAB — GLUCOSE, CAPILLARY
Glucose-Capillary: 223 mg/dL — ABNORMAL HIGH (ref 70–99)
Glucose-Capillary: 127 mg/dL — ABNORMAL HIGH (ref 70–99)
Glucose-Capillary: 426 mg/dL — ABNORMAL HIGH (ref 70–99)
Glucose-Capillary: 465 mg/dL — ABNORMAL HIGH (ref 70–99)
Glucose-Capillary: 405 mg/dL — ABNORMAL HIGH (ref 70–99)

## 2018-11-14 MED ORDER — INSULIN ASPART 100 UNIT/ML ~~LOC~~ SOLN
10.0000 [IU] | Freq: Once | SUBCUTANEOUS | Status: AC
  Administered 2018-11-14: 10 [IU] via SUBCUTANEOUS

## 2018-11-14 MED ORDER — INSULIN GLARGINE 100 UNIT/ML ~~LOC~~ SOLN
15.0000 [IU] | Freq: Every day | SUBCUTANEOUS | Status: DC
  Administered 2018-11-14 – 2018-11-15 (×2): 15 [IU] via SUBCUTANEOUS
  Filled 2018-11-14 (×3): qty 0.15

## 2018-11-14 MED ORDER — INSULIN GLARGINE 100 UNIT/ML ~~LOC~~ SOLN: 17.0000 [IU] | Freq: Every day | SUBCUTANEOUS | Status: DC

## 2018-11-14 NOTE — Progress Notes (Signed)
Subjective: No overnight events. Ms. Knowlton reports that her dyspnea has improved. She occasionally feels chest pain, but only when she coughs. She is currently on her home oxygen requirement of 2L [place]. She reports some skin changes, primarily on her extensor elbows. She has a history of psoriasis and states she needs to see a dermatologist. Overall, her psoriasis is better than it has been in the past. She denies joint pain. All of her questions were addressed.   Objective:  Vital signs in last 24 hours: Vitals:   11/13/18 1213 11/13/18 1656 11/13/18 1918 11/13/18 2257  BP: (!) 149/95 (!) 152/92  (!) 148/84  Pulse: 93 93  85  Resp: 20 17  18   Temp: 97.6 F (36.4 C) 97.6 F (36.4 C)  97.9 F (36.6 C)  TempSrc: Axillary Oral  Oral  SpO2: 100% 100% 98% 97%    General: sitting up comfortably in bed, no distress Cardiac: RRR, no m/r/g Pulmonary: normal effort on 2L [place], faint bibasilar crackles Extremity: Trace BL LE edema Skin: hyperpigmented patches on the bilateral extensor elbows.   Assessment/Plan:  Active Problems:   Essential hypertension   Hypoxia   Chronic combined systolic and diastolic congestive heart failure ([place])  This is a 68 year old female with a history of COPD (noncompliant with home supplemental oxygen), NICM (EF 25-30%), Combined systolic and diastolic HF, HTN, DM, and HLD who presented with worsening DOE x2 days. Associated with wheezing, chills, rhinorrhea, nasal congestion, and nonproductive cough. Found to be tachycardic, hypoxic on RA, BNP 127. She was given atrovent and albuterol and placed on supplemental oxygen.   Hypoxic respiratory failure 2/2 HF exacerbation vs ILD: -Her PFTs done on 2/25 showed FVC of 1.4, FEV1 1.1, FEV/FVC ratio of 79%, TLC percent predicted of 74%, DLCO 55% of predicated.  It was unclear if she had a COPD in the past and given these findings they may be more consistent with ILD.  She had a CT chest in January 2018 that showed chronic  appearance of mosaic attenuation pattern in the right greater than left lungs, this was nonspecific and can be seen in small airway disease, edema, infection or cardiac interstitial lung disease.  She also has some hyperpigmented and hyperkeratotic areas on her elbows and some skin changes possibly consistent with psoriasis.  She may have a psoriatic interstitial lung disease.  Today she is on 2 L nasal cannula and is status post azithromycin, and prednisone.  She had some improvement in her breathing but still feels short of breath.  She is now on her home oxygen requirement.  We can continue the steroids and de-escalate her home COPD medications. -Discontinue azithromycin -Continue prednisone -Continue DuoNebs as needed -Discontinue Dulera -Continue duo nebs as needed for now -Consider a high-res CT scan of her chest, will contact radiology to assess this will be beneficial  NICM: Combined CHF, mild exacerbation (EF 25-30%): -  She also has a history of failure and.'s fluid overloaded yesterday, she was given 60 IV Lasix yesterday.  Today she reports that her breathing has improved a little bit from admission however she is still having some shortness of breath.  On exam she still has some bibasilar crackles, lower extremity edema has improved.  Creatinine today was 1.16, a little elevated from yesterday.  Weight on January 6 was 205, weight today is 201 lbs. -Strict I's and O's -Daily weights -S/p 60 IV lasix yesterday -Resume Lasix 80 mg p.o. twice daily -Fluid restriction  Diabetes  mellitus: -Patient's last A1c was 9.3.  She is on Victoza 1.8 mg daily and Synjardy twice daily at home.  -Continue Lantus 10 units nightly -Continue mealtime insulin 4 units daily -Sliding-scale insulin -Frequent CBGs  Hypertension: -Continue lisinopril, metoprolol, aldactone  Hyperlipidemia: -Continue Lipitor 80 mg daily  Hypokalemia: -K today was 4.5 -Continue to trend, replete as  needed  FEN: Fluid restriction, replete lytes prn, heart healthy with fluid restriction diet  VTE ppx: Lovenox  Code Status: FULL    Dispo: Anticipated discharge in approximately 1-2 day(s).   Asencion Noble, MD 11/14/2018, 6:32 AM Pager: [phone]

## 2018-11-14 NOTE — Progress Notes (Signed)
  Date: 11/14/2018  Patient name: JAI BEAR  Medical record number: [id number]  Date of birth: [birth year]   I have seen and evaluated this patient and I have discussed the plan of care with the house staff. Please see Dr. Dorothyann Peng note for complete details. I concur with her findings and plan.   Dr. Sherry Ruffing discussed HRCT of the lungs with radiology and it seems this would add to her clinical picture.  If she does have ILD, it is possibly related to her psoriasis and autoimmune history.  She would need to be transitioned to appropriate therapy for this disorder and likely need pulmonology evaluation.   Sid Falcon, MD 11/14/2018, 4:17 PM

## 2018-11-14 NOTE — Discharge Summary (Signed)
Name: [name] MRN: [id number] DOB: 09/23/1951 68 y.o. PCP: Kathi Ludwig, MD  Date of Admission: 11/13/2018 12:59 AM Date of Discharge: 11/16/2018 Attending Physician: Gilles Chiquito B  Discharge Diagnosis: 1. Acute hypoxic respiratory failure 2. Combined HF (EF 25-30%, NICM) 3. Diabetes mellitus  Discharge Medications: Allergies as of 11/16/2018      Reactions   Actos [pioglitazone] Other (See Comments)   REACTION: congestive heart failure   Naproxen Other (See Comments)   574m dose made her sleep for two days   Rosiglitazone Hives   Hydrocortisone Other (See Comments)   unknown      Medication List    STOP taking these medications   Fluticasone-Salmeterol 250-50 MCG/DOSE Aepb Commonly known as:  ADVAIR DISKUS   tiotropium 18 MCG inhalation capsule Commonly known as:  SPIRIVA     TAKE these medications   ACCU-CHEK FASTCLIX LANCET Kit Lancing device, use with lancets for blood glucose monitoring.   albuterol (2.5 MG/3ML) 0.083% nebulizer solution Commonly known as:  PROVENTIL Take 3 mLs (2.5 mg total) by nebulization every 6 (six) hours as needed for wheezing or shortness of breath. What changed:    how much to take  how to take this  when to take this  reasons to take this  additional instructions   aspirin EC 81 MG tablet Take 1 tablet (81 mg total) by mouth daily.   atorvastatin 80 MG tablet Commonly known as:  LIPITOR Take 1 tablet (80 mg total) by mouth at bedtime.   betamethasone dipropionate 0.05 % cream Commonly known as:  DIPROLENE Apply topically 2 (two) times daily. To both hands.   CONTOUR NEXT MONITOR w/Device Kit 1 Act by Does not apply route 4 (four) times daily.   Empagliflozin-metFORMIN HCl ER 01-999 MG Tb24 Commonly known as:  SYNJARDY XR Take 1 tablet by mouth 2 (two) times daily with a meal.   furosemide 80 MG tablet Commonly known as:  LASIX Take 1 tablet (80 mg total) by mouth 2 (two) times daily.   glucose  blood test strip Commonly known as:  CONTOUR NEXT TEST Use as instructed   Lancets Misc 1 Act by Does not apply route 4 (four) times daily.   liraglutide 18 MG/3ML Sopn Commonly known as:  VICTOZA Inject 0.3 mLs (1.8 mg total) into the skin daily.   lisinopril 40 MG tablet Commonly known as:  PRINIVIL,ZESTRIL Take 1 tablet (40 mg total) by mouth daily.   metoprolol succinate 50 MG 24 hr tablet Commonly known as:  TOPROL-XL Take 2 tablets (100 mg total) by mouth daily.   nitroGLYCERIN 0.4 MG SL tablet Commonly known as:  NITROSTAT Place 1 tablet (0.4 mg total) under the tongue every 5 (five) minutes as needed for chest pain. Reported on 03/18/2016   pantoprazole 40 MG tablet Commonly known as:  PROTONIX Take 1 tablet (40 mg total) by mouth daily.   predniSONE 20 MG tablet Commonly known as:  DELTASONE Take 1 tablet (20 mg total) by mouth daily with breakfast.   spironolactone 25 MG tablet Commonly known as:  ALDACTONE TAKE 0.5 TABLETS BY MOUTH EVERY DAY What changed:  See the new instructions.   umeclidinium-vilanterol 62.5-25 MCG/INH Aepb Commonly known as:  ANORO ELLIPTA Inhale 1 puff into the lungs daily.       Disposition and follow-up:   [facility]eg[facility] MEM[facility]  At the hospital follow up visit please address:  1.  Acute  hypoxic respiratory failure (?COPD vs hypersensitivity pneumonitis): PFTs more consistent with restrictive disease. CT scan showed mosaic attenuation which can be seen with ILD. High res CT showed small airway disease, possible findings of hypersensitivity pneumonitis. We continued her home medications on discharge but make sure she has pulmonary follow up.   Combined HF: She was diuresed with IV lasix, restarted on her home lasix. Please make sure that she is sticking to a fluid restricted diet and low Na diet. Weight down to 196.8 lbs on discharge.  2.  Labs / imaging needed at time of  follow-up: BMP, CBC  3.  Pending labs/ test needing follow-up: None  Follow-up Appointments: Follow-up Information    [facility]. Schedule an appointment as soon as possible for a visit.   Specialty:  Pulmonology Why:  1-2 weeks Contact information: [address] [place] [address] [phone]       Kathi Ludwig, MD. Go to.   Specialty:  Internal Medicine Why:  Feb 21st at 10:15 Contact information: [address] [place] [address] [place] [facility].   Why:  neb machine Contact information: [address] [place] [address] [place] by problem list: 1. Acute hypoxic respiratory failure: This is a 68 year old female with a history of COPD (noncompliant with home supplemental oxygen), NICM (EF 25-30%), Combined systolic and diastolic HF, HTN, DM, and HLD who presented with worsening DOE x2 days. Associated with wheezing, chills, rhinorrhea, nasal congestion, and nonproductive cough. Found to be tachycardic, hypoxic on RA, BNP 127. She was given atrovent and albuteroland placed on supplemental oxygen. PFTs done on 1/24 showed FVC of 1.4, FEV1 1.1, FEV/FVC ratio of 79%, TLC percent predicted of 74%, DLCO 55% of predicated.  This is consistent with a COPD picture, more of a parenchymal picture.  High res CT chest showed small airway disease, findings consistent withpulmonaryedema, and findings that could be consistent with hypersensitivity pneumonitis.  Still a little unclear if she does have a component of COPD.  She was treated with a short course of prednisone which helped improve her symptoms.  She will need further work-up by pulmonology to differentiate her disease.  2. Combined HF (EF 25-30%, NICM): She had been having worsening shortness of breath, denied any orthopnea, PND, or LE edema. Did appear fluid overloaded on exam with bibasilar  crackles, elevated JVD and positive hepatojugular reflux.  BNP was mildly elevated at 127. CXR showed cardiomegaly and vascular congestion  We diuresed her with IV Lasix and she had good diuresis. Weight down to 196.8 lbs.  Pulmonary symptoms improve, it may have been mostly her heart failure exacerbation that was causing her acute hypoxia.  We started her on her home dose of Lasix on discharge.  3. Diabetes mellitus: She is on Victoza 1.8 mg daily and Synjardy twice daily at home.  Last A1c was 9.3.  We treated her with Lantus and sliding scale insulin.  Home medications were discontinued on discharge.  Discharge Vitals:   BP 130/78 (BP Location: Left Arm)   Pulse 64   Temp 97.6 F (36.4 C) (Oral)   Resp 16   Ht 5' 1"  (1.549 m)   Wt 89.3 kg   SpO2 100%   BMI 37.19 kg/m   Pertinent Labs, Studies, and Procedures:  BMP Latest Ref Rng &  Units 11/16/2018 11/15/2018 11/14/2018  Glucose 70 - 99 mg/dL 97 130(H) 371(H)  BUN 8 - 23 mg/dL 39(H) 38(H) 32(H)  Creatinine 0.44 - 1.00 mg/dL 0.97 1.09(H) 1.16(H)  BUN/Creat Ratio 12 - 28 - - -  Sodium 135 - 145 mmol/L 141 138 136  Potassium 3.5 - 5.1 mmol/L 4.0 4.5 4.5  Chloride 98 - 111 mmol/L 102 103 100  CO2 22 - 32 mmol/L 32 26 23  Calcium 8.9 - 10.3 mg/dL 9.2 9.4 9.3   CBC Latest Ref Rng & Units 11/16/2018 11/15/2018 11/13/2018  WBC 4.0 - 10.5 K/uL 14.2(H) 20.5(H) 9.2  Hemoglobin 12.0 - 15.0 g/dL 13.2 12.9 13.4  Hematocrit 36.0 - 46.0 % 42.9 41.8 44.3  Platelets 150 - 400 K/uL 227 219 209     Discharge Instructions: Discharge Instructions    (HEART FAILURE PATIENTS) Call MD:  Anytime you have any of the following symptoms: 1) 3 pound weight gain in 24 hours or 5 pounds in 1 week 2) shortness of breath, with or without a dry hacking cough 3) swelling in the hands, feet or stomach 4) if you have to sleep on extra pillows at night in order to breathe.   Complete by:  As directed    Call MD for:  difficulty breathing, headache or visual  disturbances   Complete by:  As directed    Call MD for:  extreme fatigue   Complete by:  As directed    Call MD for:  hives   Complete by:  As directed    Call MD for:  persistant dizziness or light-headedness   Complete by:  As directed    Call MD for:  persistant nausea and vomiting   Complete by:  As directed    Call MD for:  redness, tenderness, or signs of infection (pain, swelling, redness, odor or green/yellow discharge around incision site)   Complete by:  As directed    Call MD for:  severe uncontrolled pain   Complete by:  As directed    Call MD for:  temperature >100.4   Complete by:  As directed    Diet - low sodium heart healthy   Complete by:  As directed    Increase activity slowly   Complete by:  As directed      CT chest 11/14/18: IMPRESSION: 1. Striking mosaic attenuation throughout both lungs, seen to represent prominent air trapping on the expiratory sequence, a chronic finding and indicative of small airways disease. 2. Superimposed mild patchy ground-glass opacity and scattered mild interlobular septal thickening in both lungs, upper lung predominant. Given the cardiomegaly and trace dependent bilateral pleural effusions, these findings are favored to represent superimposed mild pulmonary edema. Alternatively, given the chronic air trapping, hypersensitivity pneumonitis could be considered. 3. Chronic stable mild mediastinal lymphadenopathy, most compatible with benign reactive adenopathy.  Aortic Atherosclerosis (ICD10-I70.0).   CXR 11/13/18: IMPRESSION: Cardiomegaly, vascular congestion.   Signed: Asencion Noble, MD 11/19/2018, 1:31 PM   Pager: [phone]

## 2018-11-15 LAB — CBC
HCT: 41.8 % (ref 36.0–46.0)
Hemoglobin: 12.9 g/dL (ref 12.0–15.0)
MCH: 26.5 pg (ref 26.0–34.0)
MCHC: 30.9 g/dL (ref 30.0–36.0)
MCV: 85.8 fL (ref 80.0–100.0)
Platelets: 219 10*3/uL (ref 150–400)
RBC: 4.87 MIL/uL (ref 3.87–5.11)
RDW: 15.8 % — ABNORMAL HIGH (ref 11.5–15.5)
WBC: 20.5 10*3/uL — ABNORMAL HIGH (ref 4.0–10.5)
nRBC: 0 % (ref 0.0–0.2)

## 2018-11-15 LAB — BASIC METABOLIC PANEL
Anion gap: 9 (ref 5–15)
BUN: 38 mg/dL — ABNORMAL HIGH (ref 8–23)
CO2: 26 mmol/L (ref 22–32)
Calcium: 9.4 mg/dL (ref 8.9–10.3)
Chloride: 103 mmol/L (ref 98–111)
Creatinine, Ser: 1.09 mg/dL — ABNORMAL HIGH (ref 0.44–1.00)
GFR calc Af Amer: >60 mL/min (ref >60)
GFR calc non Af Amer: 53 mL/min — ABNORMAL LOW (ref >60)
Glucose, Bld: 130 mg/dL — ABNORMAL HIGH (ref 70–99)
Potassium: 4.5 mmol/L (ref 3.5–5.1)
Sodium: 138 mmol/L (ref 135–145)

## 2018-11-15 LAB — GLUCOSE, CAPILLARY
Glucose-Capillary: 121 mg/dL — ABNORMAL HIGH (ref 70–99)
Glucose-Capillary: 159 mg/dL — ABNORMAL HIGH (ref 70–99)
Glucose-Capillary: 332 mg/dL — ABNORMAL HIGH (ref 70–99)
Glucose-Capillary: 128 mg/dL — ABNORMAL HIGH (ref 70–99)

## 2018-11-15 LAB — CYCLIC CITRUL PEPTIDE ANTIBODY, IGG/IGA: CCP Antibodies IgG/IgA: 27 units — ABNORMAL HIGH (ref 0–19)

## 2018-11-15 LAB — RHEUMATOID FACTOR: Rheumatoid fact SerPl-aCnc: 32.9 IU/mL — ABNORMAL HIGH (ref 0.0–13.9)

## 2018-11-15 LAB — ANA W/REFLEX IF POSITIVE: Anti Nuclear Antibody(ANA): NEGATIVE

## 2018-11-15 MED ORDER — UMECLIDINIUM-VILANTEROL 62.5-25 MCG/INH IN AEPB
1.0000 | INHALATION_SPRAY | Freq: Every day | RESPIRATORY_TRACT | Status: DC
  Filled 2018-11-15: qty 14

## 2018-11-15 MED ORDER — FUROSEMIDE 10 MG/ML IJ SOLN
60.0000 mg | Freq: Once | INTRAMUSCULAR | Status: AC
  Administered 2018-11-15: 60 mg via INTRAVENOUS
  Filled 2018-11-15: qty 6

## 2018-11-15 MED ORDER — UMECLIDINIUM-VILANTEROL 62.5-25 MCG/INH IN AEPB
1.0000 | INHALATION_SPRAY | Freq: Every day | RESPIRATORY_TRACT | Status: DC
  Administered 2018-11-16: 1 via RESPIRATORY_TRACT
  Filled 2018-11-15: qty 14

## 2018-11-15 MED ORDER — FUROSEMIDE 80 MG PO TABS
80.0000 mg | ORAL_TABLET | Freq: Two times a day (BID) | ORAL | Status: DC
  Administered 2018-11-16: 80 mg via ORAL
  Filled 2018-11-15: qty 1

## 2018-11-15 MED ORDER — PREDNISONE 20 MG PO TABS
20.0000 mg | ORAL_TABLET | Freq: Every day | ORAL | Status: DC
  Administered 2018-11-15 – 2018-11-16 (×2): 20 mg via ORAL
  Filled 2018-11-15 (×2): qty 1

## 2018-11-15 NOTE — Progress Notes (Signed)
   Subjective: No overnight events. [name] reports that her breathing is good today and she is on her home oxygen requirement. She states that she has been able to get out of bed and walk around. She gets winded when she does this but she has been feeling better. Sometimes she gets winded like that at home, but other times she can walk without getting short of breath. She has seen a pulmonologist in the past, but this was years ago and she does not remember the name of the provider. All questions were answered.   Objective:  Vital signs in last 24 hours: Vitals:   11/14/18 1706 11/14/18 1942 11/15/18 0022 11/15/18 0429  BP: 138/83  140/69   Pulse: 81  82   Resp: 20  20   Temp: 97.9 F (36.6 C)  98 F (36.7 C)   TempSrc: Oral  Oral   SpO2: 100% 98% 98%   Weight:    95.8 kg  Height:        General: Lying comfortably in bed, no distress Cardiac: RRR, no murmurs, no JVD Pulmonary: normal effort on 2L [place], faint bibasilar crackles Extremity: Trace BLE edema   Assessment/Plan:  Active Problems:   Essential hypertension   Hypoxia   Chronic combined systolic and diastolic congestive heart failure (HCC) This is a 68 year old female with a history of COPD (noncompliant with home supplemental oxygen), NICM (EF 25-30%), Combined systolic and diastolic HF, HTN, DM, and HLD who presented with worsening DOE x2 days. Associated with wheezing, chills, rhinorrhea, nasal congestion, and nonproductive cough. Found to be tachycardic, hypoxic on RA, BNP 127. She was given atrovent and albuteroland placed on supplemental oxygen.   Hypoxicrespiratory failure 2/2 HF exacerbation vs ILD: -Her PFTs done on 2/25 showed FVC of 1.4, FEV1 1.1, FEV/FVC ratio of 79%, TLC percent predicted of 74%, DLCO 55% of predicated. High-res CT chest showed small airway disease, findings consistent with pulmonary edema, and findings that could be consistent with hypersensitivity pneumonitis. She also has some fluid  overload on exam so her acute respiratory failure it may be related to a mild HF exacerbation. We will continue to diurese for now.  -Decrease prednisone from 40mg  to 20mg  daily, can continue to wean off -Continue DuoNebs as needed -Discontinue Dulera -Lasix IV 80 mg BID -Will need pulmonology follow up outpatient  NICM: Combined CHF, mild exacerbation (EF 25-30%): -Patient is some improvement in her oxygenation. She is still fluid overloaded on exam. Weight on January 6 was 205, weight increased today 211, from 201.  -Strict I's and O's -Daily weights -S/p 60 IV lasix yesterday -Increase Lasix 80 mg IV twice daily -Fluid restriction  Diabetes mellitus: -Patient's last A1c was 9.3. She is on Victoza 1.8 mg daily and Synjardy twice daily at home.  -Continue Lantus 10 units nightly -Continue mealtime insulin 4 units daily -Sliding-scale insulin -Frequent CBGs  Hypertension: -Continue lisinopril, metoprolol, aldactone  Hyperlipidemia: -Continue Lipitor 80 mg daily  Hypokalemia: -K today was 4.5 -Continue to trend, replete as needed  RSW:NIOEV restriction, replete lytes prn,heart healthy with fluid restrictiondiet  VTE ppx: Lovenox  Code Status: FULL   Dispo: Anticipated discharge in approximately 1-2 day(s).   Asencion Noble, MD 11/15/2018, 6:25 AM Pager: [phone]

## 2018-11-15 NOTE — Progress Notes (Signed)
  Date: 11/15/2018  Patient name: [name]  Medical record number: [id number]  Date of birth: Dec 04, 1950   I have seen and evaluated this patient and I have discussed the plan of care with the house staff. Please see Dr. Dorothyann [name] note for complete details. I concur with her findings and plan.  [name] is slowly getting better.  Her HRCT of the chest showed possible hypersensitivity pneumonitis vs. Small airway disease and mild pulmonary edema.   She did have decreased FEV 1, FVC and FEV6 which would indicate obstructive disease outside of normal cut offs for FEV1/FVC.  I think these findings most likely represent small airway disease and HFrEF with pulmonary edema.  A thorough social history could give more information in to whether she has risk factors for hypersensitivity pneumonitis.  Team is diuresing today.   Sid Falcon, MD 11/15/2018, 2:39 PM

## 2018-11-16 ENCOUNTER — Telehealth: Payer: Self-pay

## 2018-11-16 DIAGNOSIS — J9601 Acute respiratory failure with hypoxia: Secondary | ICD-10-CM

## 2018-11-16 LAB — BASIC METABOLIC PANEL
Anion gap: 7 (ref 5–15)
BUN: 39 mg/dL — ABNORMAL HIGH (ref 8–23)
CO2: 32 mmol/L (ref 22–32)
Calcium: 9.2 mg/dL (ref 8.9–10.3)
Chloride: 102 mmol/L (ref 98–111)
Creatinine, Ser: 0.97 mg/dL (ref 0.44–1.00)
GFR calc Af Amer: >60 mL/min (ref >60)
GFR calc non Af Amer: >60 mL/min (ref >60)
Glucose, Bld: 97 mg/dL (ref 70–99)
Potassium: 4 mmol/L (ref 3.5–5.1)
Sodium: 141 mmol/L (ref 135–145)

## 2018-11-16 LAB — GLUCOSE, CAPILLARY
Glucose-Capillary: 98 mg/dL (ref 70–99)
Glucose-Capillary: 295 mg/dL — ABNORMAL HIGH (ref 70–99)

## 2018-11-16 LAB — CBC
HCT: 42.9 % (ref 36.0–46.0)
Hemoglobin: 13.2 g/dL (ref 12.0–15.0)
MCH: 26.3 pg (ref 26.0–34.0)
MCHC: 30.8 g/dL (ref 30.0–36.0)
MCV: 85.5 fL (ref 80.0–100.0)
Platelets: 227 10*3/uL (ref 150–400)
RBC: 5.02 MIL/uL (ref 3.87–5.11)
RDW: 15.8 % — ABNORMAL HIGH (ref 11.5–15.5)
WBC: 14.2 10*3/uL — ABNORMAL HIGH (ref 4.0–10.5)
nRBC: 0 % (ref 0.0–0.2)

## 2018-11-16 MED ORDER — ALBUTEROL SULFATE (2.5 MG/3ML) 0.083% IN NEBU: 2.5000 mg | INHALATION_SOLUTION | Freq: Four times a day (QID) | RESPIRATORY_TRACT | 12 refills | Status: DC | PRN

## 2018-11-16 MED ORDER — UMECLIDINIUM-VILANTEROL 62.5-25 MCG/INH IN AEPB: 1.0000 | INHALATION_SPRAY | Freq: Every day | RESPIRATORY_TRACT | 3 refills | Status: DC

## 2018-11-16 MED ORDER — PREDNISONE 20 MG PO TABS: 20.0000 mg | ORAL_TABLET | Freq: Every day | ORAL | 0 refills | Status: DC

## 2018-11-16 NOTE — Progress Notes (Signed)
Subjective: No overnight events. [name] reports that she feels well today, breathing has been back to her baseline. She had no acute concerns. All of her questions were answered. She would like a nebulizer at home. The plan for discharge today was discussed.   Objective:  Vital signs in last 24 hours: Vitals:   11/15/18 0758 11/15/18 1720 11/15/18 2019 11/16/18 0419  BP: 140/78 132/85 128/74 123/74  Pulse: 70 60 65 62  Resp: 20 20  20   Temp: 98.3 F (36.8 C) 98.3 F (36.8 C) 97.7 F (36.5 C) 97.7 F (36.5 C)  TempSrc:   Oral Oral  SpO2: 100% 99% 100% 100%  Weight:    89.3 kg  Height:        General: lying comfortably in bed, no distress Cardiac: RRR, no murmurs Pulmonary: normal effort on home oxygen requirement of 2L [place], minimal bibasilar crackles Abdomen: bowel sounds present, soft, non-distended, nontender   Assessment/Plan:  Active Problems:   Essential hypertension   Hypoxia   Chronic combined systolic and diastolic congestive heart failure ([place]) This is a 68 year old female with a history of COPD (noncompliant with home supplemental oxygen), NICM (EF 25-30%), Combined systolic and diastolic HF, HTN, DM, and HLD who presented with worsening DOE x2 days. Associated with wheezing, chills, rhinorrhea, nasal congestion, and nonproductive cough. Found to be tachycardic, hypoxic on RA, BNP 127. She was given atrovent and albuteroland placed on supplemental oxygen.   Hypoxicrespiratory failure2/2 HF exacerbation vs ILD: -Her PFTs done on 2/25 showed FVC of 1.4, FEV1 1.1, FEV/FVC ratio of 79%, TLC percent predicted of 74%, DLCO 55% of predicated.High-res CT chest showed small airway disease, findings consistent with pulmonary edema, and findings that could be consistent with hypersensitivity pneumonitis. She also has some fluid overload on exam so her acute respiratory failure it may be related to a mild HF exacerbation. She did have 1.3 L net output and weight went from  95 kg to 89 (however it was 91 kg 2 days ago so yesterday's weight is likely an error). We will continue duonebs as needed for now and have her follow up with pulmonology for further evaluation of her lung disease. She is stable for discharge today.  -Continue prednisone 20mg  daily x2 days -Continue DuoNebs as needed -Continue home lasix 80 mg BID -Will need pulmonology follow up outpatient  NICM: Combined CHF, mild exacerbation (EF 25-30%): -She has been diuresing well, net output of 1.3 L from yesterday, weight from 91 kg > 95 kg > 89 kg. She is now on her home dose of lasix. She had not had any fluid restriction at home and she was advised to limit her fluids to 1.5 L per day and to limit her Na intake.  -Strict I's and O's -Daily weights -Continue home Lasix 80 mg twice daily -Fluid restriction  Diabetes mellitus: -Patient's last A1c was 9.3. She is on Victoza 1.8 mg daily and Synjardy twice daily at home. She is not controlled and will need follow up with her PCP for altering her medications.  -Continue Lantus 10 units nightly -Continue mealtime insulin 4 units daily -Sliding-scale insulin -Frequent CBGs -Continue home medications on discharge  Hypertension: -Continue lisinopril, metoprolol, aldactone  Hyperlipidemia: -Continue Lipitor 80 mg daily  Hypokalemia: -K today was4 -Continue to trend, replete as needed  CWU:GQBVQ restriction, replete lytes prn,heart healthy with fluid restrictiondiet  VTE ppx: Lovenox  Code Status: FULL Dispo: Anticipated discharge is today.  Asencion Noble, MD 11/16/2018,  6:23 AM Pager: [phone]

## 2018-11-16 NOTE — Progress Notes (Signed)
  Date: 11/16/2018  Patient name: [name]  Medical record number: [id number]  Date of birth: 1951/06/10   I have seen and evaluated this patient and I have discussed the plan of care with the house staff. Please see Dr. Dorothyann Peng note for complete details. I concur with her findings and plan.     She will be discharged today. Advised to take her lasix daily and use her oxygen more consistently.  Follow up with PCP Dr. Berline Lopes in the near future.   Sid Falcon, MD 11/16/2018, 1:53 PM

## 2018-11-16 NOTE — Telephone Encounter (Signed)
Hospital TOC per Dr Sherry Ruffing, discharge 11/16/18,  appt 11/23/18.

## 2018-11-16 NOTE — Care Management Note (Addendum)
Case Management Note  Patient Details  Name: [name] MRN: [id number] Date of Birth: [birth year]  Subjective/Objective:  From home for possible dc today, will need neb machine, Jermaine with [facility] will bring neb machine up to patient' s room prior to dc.  NCM informed Ambulance person to check with MD about medications for nebs, they put in as an order, patient will need to go to local pharmacy to get those filled.  Patient informed RN that she has some at home. RN states she will check with MD.                 Action/Plan: DC home when neb machine received.   Expected Discharge Date:                  Expected Discharge Plan:  Home/Self Care  In-House Referral:     Discharge planning Services  CM Consult  Post Acute Care Choice:  Durable Medical Equipment Choice offered to:  Patient  DME Arranged:  Nebulizer machine DME Agency:  [place]:    [facility] Agency:     Status of Service:  Completed, signed off  If discussed at [place] of Stay Meetings, dates discussed:    Additional Comments:  Zenon Mayo, RN 11/16/2018, 12:55 PM

## 2018-11-16 NOTE — Care Management Important Message (Signed)
Important Message  Patient Details  Name: [name] MRN: [id number] Date of Birth: 1951-07-30   Medicare Important Message Given:  Yes    Orbie Pyo 11/16/2018, 3:38 PM

## 2018-11-16 NOTE — Discharge Instructions (Signed)
[name],   It has been a pleasure working with you and we are glad you're feeling better. You were hospitalized for shortness of breath. We think that this may have been related to your heart failure. We did some imaging of your chest which showed some signs of COPD.   For your heart failure,  Please continue taking your home Lasix Please limit your fluid intake to 1.5 L per day Please limit your sodium intake to 2 g of sodium per day Continue to weigh yourself daily and please contact the clinic if you have a weight gain of greater than 3 pounds in a day or greater than 5 pounds over 3 days  For your COPD, Your breathing test that you had was not completely consistent with COPD which is why we got the imaging. The imaging did show some signs of COPD so it is unclear what your exact diagnosis is. You should follow up with the pulmonologist to have this further evaluated.  -Please take prednisone 20 mg, you only have one more day to complete your course -Continue your home medications for now and follow up with pulmonology to see what changes need to be made.   Follow up with your primary care provider in 1-2 weeks  If your symptoms worsen or you develop new symptoms, please seek medical help whether it is your primary care provider or emergency department.  If you have any questions about this hospitalization please call [phone].

## 2018-11-23 ENCOUNTER — Ambulatory Visit: Payer: Medicare Other

## 2018-11-23 ENCOUNTER — Encounter: Payer: Medicare Other | Admitting: Internal Medicine

## 2018-11-27 ENCOUNTER — Encounter: Payer: Self-pay | Admitting: Internal Medicine

## 2018-11-27 ENCOUNTER — Other Ambulatory Visit: Payer: Self-pay

## 2018-11-27 ENCOUNTER — Ambulatory Visit (INDEPENDENT_AMBULATORY_CARE_PROVIDER_SITE_OTHER): Payer: Medicare Other | Admitting: Internal Medicine

## 2018-11-27 ENCOUNTER — Encounter (INDEPENDENT_AMBULATORY_CARE_PROVIDER_SITE_OTHER): Payer: Self-pay

## 2018-11-27 VITALS — BP 110/72 | HR 99 | Ht 61.0 in | Wt 200.8 lb

## 2018-11-27 DIAGNOSIS — I428 Other cardiomyopathies: Secondary | ICD-10-CM

## 2018-11-27 DIAGNOSIS — I5042 Chronic combined systolic (congestive) and diastolic (congestive) heart failure: Secondary | ICD-10-CM | POA: Diagnosis not present

## 2018-11-27 DIAGNOSIS — Z95810 Presence of automatic (implantable) cardiac defibrillator: Secondary | ICD-10-CM

## 2018-11-27 NOTE — Patient Outreach (Signed)
[facility]) Care Management  11/27/2018  [name] 1951-03-04 [id number]   Medication Adherence call to [name] [facility] Identifiers Verify  spoke with patient she pick up Lisinopril 40 mg from [facility] at the beginning of the month,[name] is showing past due under [place].    [place] Management Direct Dial [phone]  Fax [phone] [email or web address]

## 2018-11-27 NOTE — Patient Instructions (Signed)
Medication Instructions:  Your physician recommends that you continue on your current medications as directed. Please refer to the Current Medication list given to you today.  Labwork: None ordered.  Testing/Procedures: None ordered.  Follow-Up:  Your physician wants you to follow-up in:  3 months with the device clinic for a device check.   Your physician wants you to follow-up in: one year with Dr. Lovena Le.   You will receive a reminder letter in the mail two months in advance. If you don't receive a letter, please call our office to schedule the follow-up appointment.  Any Other Special Instructions Will Be Listed Below (If Applicable).  If you need a refill on your cardiac medications before your next appointment, please call your pharmacy.

## 2018-11-27 NOTE — Progress Notes (Signed)
HPI [name] returns today for ongoing evaluation and management of her CHF, NICM, s/p ICD insertion. The patient has been stable in the interim. She is still smoking and using oxygen despite being told otherwise. She has mild peripheral edema. No syncope. No ICD shock. No chest pain. She is down to smoking 4 cigs a day and trying to stop altogether. No ICD therapies. Allergies  Allergen Reactions  . Actos [Pioglitazone] Other (See Comments)    REACTION: congestive heart failure   . Naproxen Other (See Comments)    510m dose made her sleep for two days  . Rosiglitazone Hives  . Hydrocortisone Other (See Comments)    unknown     Current Outpatient Medications  Medication Sig Dispense Refill  . albuterol (PROVENTIL) (2.5 MG/3ML) 0.083% nebulizer solution Take 3 mLs (2.5 mg total) by nebulization every 6 (six) hours as needed for wheezing or shortness of breath. 75 mL 12  . aspirin EC 81 MG tablet Take 1 tablet (81 mg total) by mouth daily. 90 tablet 3  . atorvastatin (LIPITOR) 80 MG tablet Take 1 tablet (80 mg total) by mouth at bedtime. 90 tablet 3  . betamethasone dipropionate (DIPROLENE) 0.05 % cream Apply topically 2 (two) times daily. To both hands. 135 g 1  . Blood Glucose Monitoring Suppl (CONTOUR NEXT MONITOR) w/Device KIT 1 Act by Does not apply route 4 (four) times daily. 1 kit 0  . Empagliflozin-metFORMIN HCl ER (SYNJARDY XR) 01-999 MG TB24 Take 1 tablet by mouth 2 (two) times daily with a meal. 60 tablet 3  . furosemide (LASIX) 80 MG tablet Take 1 tablet (80 mg total) by mouth 2 (two) times daily. 60 tablet 11  . glucose blood (CONTOUR NEXT TEST) test strip Use as instructed 200 each 2  . Lancets Misc. (ACCU-CHEK FASTCLIX LANCET) KIT Lancing device, use with lancets for blood glucose monitoring. 1 kit 0  . Lancets MISC 1 Act by Does not apply route 4 (four) times daily. 200 each 3  . liraglutide (VICTOZA) 18 MG/3ML SOPN Inject 0.3 mLs (1.8 mg total) into the skin  daily. 3 pen 3  . lisinopril (PRINIVIL,ZESTRIL) 40 MG tablet Take 1 tablet (40 mg total) by mouth daily. 30 tablet 11  . metoprolol succinate (TOPROL-XL) 50 MG 24 hr tablet Take 2 tablets (100 mg total) by mouth daily. 90 tablet 3  . nitroGLYCERIN (NITROSTAT) 0.4 MG SL tablet Place 1 tablet (0.4 mg total) under the tongue every 5 (five) minutes as needed for chest pain. Reported on 03/18/2016 25 tablet 3  . pantoprazole (PROTONIX) 40 MG tablet Take 1 tablet (40 mg total) by mouth daily. 30 tablet 2  . predniSONE (DELTASONE) 20 MG tablet Take 1 tablet (20 mg total) by mouth daily with breakfast. 1 tablet 0  . spironolactone (ALDACTONE) 25 MG tablet TAKE 0.5 TABLETS BY MOUTH EVERY DAY (Patient taking differently: Take 12.5 mg by mouth daily. ) 45 tablet 3  . umeclidinium-vilanterol (ANORO ELLIPTA) 62.5-25 MCG/INH AEPB Inhale 1 puff into the lungs daily. 1 each 3   No current facility-administered medications for this visit.      Past Medical History:  Diagnosis Date  . ATN (acute tubular necrosis) ([place] 07/15/2014  . Automatic implantable cardioverter-defibrillator in situ   . Automatic implantable cardioverter-defibrillator in situ 05/04/2007   Qualifier: Diagnosis of  By: CIsla Pence   . Cardiac defibrillator in situ    Atlas II VR (SJM) implanted by Dr TLovena Le .  CHF (congestive heart failure) ([place])   . Chronic combined systolic and diastolic heart failure (HCC)    a. EF 35-40% in past;  b. Echo 7/13:  EF 45-50%, Gr 2 diast dysfn, mild AI, mild MAC, trivial MR, mild LAE, PASP 47.  [name] Chronic ulcer of leg ([place])    04-09-15 resolved-not a problem.  . Colon polyps 04/12/2013   Rectosigmoid polyp  . COPD (chronic obstructive pulmonary disease) (HCC)    O2 at night  . Depression   . Diabetes mellitus   . Diabetes mellitus, type 2 ([place]) 04/03/2012   HIGH RISK FEET.. Please have patient take shoes and socks off every visit for visual foot inspection.    . Eczema   . Elevated alkaline  phosphatase level    GGT and 5'nucleotidase 8/13 normal  . Health care maintenance 01/22/2013   Surgically absent cervix- no pap needed (Path report 07/2000: uterine body with attached bilateral adnexa and separate cervix.)  . History of oxygen administration    oxygen @ 2 l/m nasally bedtime 24/7  . HTN (hypertension)   . Hx of cardiac cath    a. [place] 2003 normal;  b. LHC 6/13:  Mild calcification in the LM, o/w normal coronary arteries, EF 45%.   . Hyperkalemia 08/08/2017  . Hyperlipidemia   . Hyperlipidemia associated with type 2 diabetes mellitus ([place]) 05/25/2007   Qualifier: Diagnosis of  By: Hassell Done FNP, Tori Milks    . Hyperthyroidism, subclinical   . Implantable cardioverter-defibrillator (ICD) generator end of life   . Lipoma   . NICM (nonischemic cardiomyopathy) ([place])   . Obesity   . On home oxygen therapy    "2L; 24/7" (10/11/2016)  . Post menopausal syndrome   . Shortness of breath 07/14/2017  . Skin rash 07/12/2018  . Sleep apnea    pt denies 04/12/2013    ROS:   All systems reviewed and negative except as noted in the HPI.   Past Surgical History:  Procedure Laterality Date  . ABDOMINAL HYSTERECTOMY    . CARDIAC CATHETERIZATION    . CARDIAC CATHETERIZATION N/A 06/21/2016   Procedure: Left Heart Cath and Coronary Angiography;  Surgeon: Sherren Mocha, MD;  Location: [facility] CV LAB;  Service: Cardiovascular;  Laterality: N/A;  . CARDIAC DEFIBRILLATOR PLACEMENT  05/04/2007   SJM Atlas II VR ICD  . CARDIAC DEFIBRILLATOR PLACEMENT    . CATARACT EXTRACTION     od  . COLONOSCOPY N/A 04/12/2013   Procedure: COLONOSCOPY;  Surgeon: Beryle Beams, MD;  Location: [facility] ENDOSCOPY;  Service: Endoscopy;  Laterality: N/A;  pt.has defibrilator  . COLONOSCOPY N/A 04/16/2015   Procedure: COLONOSCOPY;  Surgeon: [name], MD;  Location: [facility] ENDOSCOPY;  Service: Endoscopy;  Laterality: N/A;  . ESOPHAGOGASTRODUODENOSCOPY N/A 04/16/2015   Procedure: ESOPHAGOGASTRODUODENOSCOPY (EGD);   Surgeon: [name], MD;  Location: [facility];  Service: Endoscopy;  Laterality: N/A;  . HERNIA REPAIR    . HYSTEROSCOPY    . IMPLANTABLE CARDIOVERTER DEFIBRILLATOR (ICD) GENERATOR CHANGE N/A 04/02/2014   Procedure: ICD GENERATOR CHANGE;  Surgeon: Evans Lance, MD;  Location: [facility] CATH LAB;  Service: Cardiovascular;  Laterality: N/A;  . INSERT / REPLACE / REMOVE PACEMAKER    . LEFT HEART CATHETERIZATION WITH CORONARY ANGIOGRAM N/A 04/02/2012   Procedure: LEFT HEART CATHETERIZATION WITH CORONARY ANGIOGRAM;  Surgeon: Hillary Bow, MD;  Location: [facility] CATH LAB;  Service: Cardiovascular;  Laterality: N/A;  . TUBAL LIGATION       Family History  Problem  Relation Age of Onset  . Stroke Mother   . Seizures Father   . Heart disease Father   . Diabetes Sister   . Asthma Maternal Aunt        aunts  . Asthma Maternal Uncle        uncles  . Heart disease Paternal Aunt        aunts  . Heart disease Paternal Uncle        uncles  . Heart disease Maternal Aunt        aunts  . Heart disease Maternal Uncle        uncles  . Heart disease Maternal Grandfather   . Colon cancer Neg Hx   . Colon polyps Neg Hx   . Esophageal cancer Neg Hx   . Kidney disease Neg Hx   . Gallbladder disease Neg Hx      Social History   Socioeconomic History  . Marital status: Widowed    Spouse name: Alroy Dust  . Number of children: 3  . Years of education: 52  . Highest education level: Not on file  Occupational History  . Occupation: disabled  Social Needs  . Financial resource strain: Not on file  . Food insecurity:    Worry: Not on file    Inability: Not on file  . Transportation needs:    Medical: Not on file    Non-medical: Not on file  Tobacco Use  . Smoking status: Current Every Day Smoker    Packs/day: 0.10    Years: 39.00    Pack years: 3.90    Types: Cigarettes  . Smokeless tobacco: Never Used  . Tobacco comment: 2 cig/day.  Substance and Sexual Activity  . Alcohol use: No     Alcohol/week: 1.0 standard drinks    Types: 1 Glasses of wine per week  . Drug use: No  . Sexual activity: Not Currently  Lifestyle  . Physical activity:    Days per week: Not on file    Minutes per session: Not on file  . Stress: Not on file  Relationships  . Social connections:    Talks on phone: Not on file    Gets together: Not on file    Attends religious service: Not on file    Active member of club or organization: Not on file    Attends meetings of clubs or organizations: Not on file    Relationship status: Not on file  . Intimate partner violence:    Fear of current or ex partner: Not on file    Emotionally abused: Not on file    Physically abused: Not on file    Forced sexual activity: Not on file  Other Topics Concern  . Not on file  Social History Narrative   ** Merged History Encounter **       Married     BP 110/72   Pulse 99   Ht _0  (1.549 m)   Wt 200 lb 12.8 oz (91.1 kg)   SpO2 99%   BMI 37.94 kg/m   Physical Exam:  stable appearing 68 yo woman, NAD HEENT: Unremarkable Neck:  6 cm JVD, no thyromegally Lymphatics:  No adenopathy Back:  No CVA tenderness Lungs:  Clear with no wheezes HEART:  Regular rate rhythm, no murmurs, no rubs, no clicks Abd:  soft, positive bowel sounds, no organomegally, no rebound, no guarding Ext:  2 plus pulses, no edema, no cyanosis, no clubbing Skin:  No rashes no nodules  Neuro:  CN II through XII intact, motor grossly intact   DEVICE  Normal device function.  See PaceArt for details.   Assess/Plan: 1. Chronic systolic heart failure - her symptoms are class 2. She will continue her current meds. I have asked that the patient reduce her sodium intake. 2. Tobacco abuse - she has reduced her consumption and I have encouraged her to stop smoking altogether. 3. ICD - her St. Jude elipse single chamber ICD is working normally. Will recheck in several months.

## 2018-11-28 ENCOUNTER — Other Ambulatory Visit: Payer: Self-pay

## 2018-11-28 ENCOUNTER — Ambulatory Visit (INDEPENDENT_AMBULATORY_CARE_PROVIDER_SITE_OTHER): Payer: Medicare Other | Admitting: Internal Medicine

## 2018-11-28 VITALS — BP 120/68 | HR 87 | Temp 97.7°F | Ht 61.0 in | Wt 202.5 lb

## 2018-11-28 DIAGNOSIS — I5042 Chronic combined systolic (congestive) and diastolic (congestive) heart failure: Secondary | ICD-10-CM | POA: Diagnosis not present

## 2018-11-28 DIAGNOSIS — N179 Acute kidney failure, unspecified: Secondary | ICD-10-CM

## 2018-11-28 DIAGNOSIS — I10 Essential (primary) hypertension: Secondary | ICD-10-CM

## 2018-11-28 DIAGNOSIS — E1169 Type 2 diabetes mellitus with other specified complication: Secondary | ICD-10-CM

## 2018-11-28 DIAGNOSIS — J449 Chronic obstructive pulmonary disease, unspecified: Secondary | ICD-10-CM | POA: Diagnosis not present

## 2018-11-28 DIAGNOSIS — Z79899 Other long term (current) drug therapy: Secondary | ICD-10-CM

## 2018-11-28 DIAGNOSIS — I428 Other cardiomyopathies: Secondary | ICD-10-CM | POA: Diagnosis not present

## 2018-11-28 DIAGNOSIS — Z7951 Long term (current) use of inhaled steroids: Secondary | ICD-10-CM

## 2018-11-28 DIAGNOSIS — F17200 Nicotine dependence, unspecified, uncomplicated: Secondary | ICD-10-CM

## 2018-11-28 DIAGNOSIS — E785 Hyperlipidemia, unspecified: Secondary | ICD-10-CM

## 2018-11-28 LAB — CUP PACEART INCLINIC DEVICE CHECK
Date Time Interrogation Session: 20200226081725
Implantable Lead Implant Date: 20080801
Implantable Lead Location: 753860
Implantable Lead Model: 7120
Implantable Pulse Generator Implant Date: 20150701
Pulse Gen Serial Number: 7206538

## 2018-11-28 MED ORDER — ATORVASTATIN CALCIUM 80 MG PO TABS: 80.0000 mg | ORAL_TABLET | Freq: Every day | ORAL | 3 refills | Status: DC

## 2018-11-28 NOTE — Assessment & Plan Note (Signed)
Patient has a history of "COPD" and is a current smoker, however last PFTs performed 10/26/2018 was notable only for a diffusion defect and mild restriction (FEV1/FVC 79, FEV1 1.11, TLC 74, and DLCO 55).  I question [name] COPD diagnosis.  During [name] recent hospitalization, [name] Advair and Spiriva were discontinued and she was started on Anoro Ellipta (LAMA/LABA).  She had a high-resolution CT chest performed due to concern for a restrictive process.  CT demonstrated mosaic attenuation throughout both lungs concerning for small airway disease vs. Hypersensitivity pneumonitis.  Also notable for patchy groundglass opacity and trace dependent bilateral effusions concerning for pulmonary edema.  She was diuresed during hospitalization and successfully weaned off oxygen.  She is asymptomatic and saturating 97% on room air today.  Overall clinical picture is much more consistent with an acute heart failure exacerbation.  I question the need for [name] current inhalers but will defer this to [name] PCP.  Given that she is clinically stable and PFTs demonstrated an isolated diffusion defect in the setting of heart failure, will hold off on referral to pulmonology. --Continue current inhaler regimen -Follow-up PCP

## 2018-11-28 NOTE — Assessment & Plan Note (Addendum)
Patient was recently admitted for acute hypoxic respiratory failure due to decompensated heart failure.  She has a nonischemic cardiomyopathy with an EF of 25-30%.  She was diuresed during hospitalization and successfully weaned off oxygen.  Her discharge weight was 196.8 lbs. She is 202 lb today on our scale, however she is oxygenating 97% on room air and asymptomatic.  Lungs are clear on auscultation. She was discharged on the new regimen of Lasix 80 mg twice daily.  She is also taking her home lisinopril 40 mg daily, metoprolol 100 mg daily, and spironolactone 12.5 mg daily.  Blood pressure today is well controlled, 120/68. --Continue current regimen -Follow-up BMP  ADDENDUM: BMP with mild AKI - called patient with results. Asked her to return next week for follow up and repeat BMP. May need reduction in diuretics.

## 2018-11-28 NOTE — Patient Instructions (Addendum)
Ms. Seiler,   It was a pleasure to see you. I am glad you are feeling better! Please continue to take your medication as previously prescribed.   I will call you with the results of your blood work today. Follow up with [place] again in 1 month or sooner if needed. If you have any questions or concerns, call our clinic at [phone] or after hours call [phone] and ask for the internal medicine resident on call. Thank you!  Dr. Philipp Ovens

## 2018-11-28 NOTE — Progress Notes (Signed)
   CC: hypoxic respiratory failure follow up  HPI:  [name] is a 68 y.o. female with past medical history outlined below here for hypoxic respiratory failure follow up. For the details of today's visit, please refer to the assessment and plan.  Past Medical History:  Diagnosis Date  . ATN (acute tubular necrosis) ([place]) 07/15/2014  . Automatic implantable cardioverter-defibrillator in situ   . Automatic implantable cardioverter-defibrillator in situ 05/04/2007   Qualifier: Diagnosis of  By: Isla Pence    . Cardiac defibrillator in situ    Atlas II VR (SJM) implanted by Dr Lovena Le  . CHF (congestive heart failure) ([place])   . Chronic combined systolic and diastolic heart failure (HCC)    a. EF 35-40% in past;  b. Echo 7/13:  EF 45-50%, Gr 2 diast dysfn, mild AI, mild MAC, trivial MR, mild LAE, PASP 47.  [name] Chronic ulcer of leg ([place])    04-09-15 resolved-not a problem.  . Colon polyps 04/12/2013   Rectosigmoid polyp  . COPD (chronic obstructive pulmonary disease) (HCC)    O2 at night  . Depression   . Diabetes mellitus   . Diabetes mellitus, type 2 ([place]) 04/03/2012   HIGH RISK FEET.. Please have patient take shoes and socks off every visit for visual foot inspection.    . Eczema   . Elevated alkaline phosphatase level    GGT and 5'nucleotidase 8/13 normal  . Health care maintenance 01/22/2013   Surgically absent cervix- no pap needed (Path report 07/2000: uterine body with attached bilateral adnexa and separate cervix.)  . History of oxygen administration    oxygen @ 2 l/m nasally bedtime 24/7  . HTN (hypertension)   . Hx of cardiac cath    a. [place] 2003 normal;  b. LHC 6/13:  Mild calcification in the LM, o/w normal coronary arteries, EF 45%.   . Hyperkalemia 08/08/2017  . Hyperlipidemia   . Hyperlipidemia associated with type 2 diabetes mellitus ([place]) 05/25/2007   Qualifier: Diagnosis of  By: Hassell Done FNP, Tori Milks    . Hyperthyroidism, subclinical   . Implantable  cardioverter-defibrillator (ICD) generator end of life   . Lipoma   . NICM (nonischemic cardiomyopathy) ([place])   . Obesity   . On home oxygen therapy    "2L; 24/7" (10/11/2016)  . Post menopausal syndrome   . Shortness of breath 07/14/2017  . Skin rash 07/12/2018  . Sleep apnea    pt denies 04/12/2013    Review of Systems  Respiratory: Negative for cough and shortness of breath.   Cardiovascular: Negative for chest pain.    Physical Exam:  Vitals:   11/28/18 1054  BP: 120/68  Pulse: 87  Temp: 97.7 F (36.5 C)  TempSrc: Oral  SpO2: 97%  Weight: 202 lb 8 oz (91.9 kg)  Height: _0  (1.549 m)    Constitutional: NAD, appears comfortable Cardiovascular: RRR, no m/r/g Pulmonary/Chest: CTAB, no wheezes, rales, or rhonchi.  Extremities: Warm and well perfused. No edema.  Psychiatric: Normal mood and affect  Assessment & Plan:   See Encounters Tab for problem based charting.  Patient discussed with Dr. Angelia Mould

## 2018-11-29 DIAGNOSIS — J449 Chronic obstructive pulmonary disease, unspecified: Secondary | ICD-10-CM | POA: Diagnosis not present

## 2018-11-29 LAB — BMP8+ANION GAP
Anion Gap: 18 mmol/L (ref 10.0–18.0)
BUN/Creatinine Ratio: 28 (ref 12–28)
BUN: 38 mg/dL — ABNORMAL HIGH (ref 8–27)
CO2: 25 mmol/L (ref 20–29)
Calcium: 9.9 mg/dL (ref 8.7–10.3)
Chloride: 96 mmol/L (ref 96–106)
Creatinine, Ser: 1.35 mg/dL — ABNORMAL HIGH (ref 0.57–1.00)
GFR calc Af Amer: 48 mL/min/{1.73_m2} — ABNORMAL LOW (ref >59)
GFR calc non Af Amer: 41 mL/min/{1.73_m2} — ABNORMAL LOW (ref >59)
Glucose: 227 mg/dL — ABNORMAL HIGH (ref 65–99)
Potassium: 4.9 mmol/L (ref 3.5–5.2)
Sodium: 139 mmol/L (ref 134–144)

## 2018-11-30 NOTE — Progress Notes (Signed)
Internal Medicine Clinic Attending  Case discussed with Dr. Guilloud at the time of the visit.  We reviewed the resident's history and exam and pertinent patient test results.  I agree with the assessment, diagnosis, and plan of care documented in the resident's note.  

## 2018-12-06 ENCOUNTER — Ambulatory Visit (INDEPENDENT_AMBULATORY_CARE_PROVIDER_SITE_OTHER): Payer: Medicare Other | Admitting: Internal Medicine

## 2018-12-06 ENCOUNTER — Encounter: Payer: Self-pay | Admitting: Internal Medicine

## 2018-12-06 ENCOUNTER — Other Ambulatory Visit: Payer: Self-pay

## 2018-12-06 VITALS — BP 115/65 | HR 90 | Temp 97.8°F | Ht 61.0 in | Wt 201.9 lb

## 2018-12-06 DIAGNOSIS — N179 Acute kidney failure, unspecified: Secondary | ICD-10-CM

## 2018-12-06 DIAGNOSIS — Z79899 Other long term (current) drug therapy: Secondary | ICD-10-CM | POA: Diagnosis not present

## 2018-12-06 DIAGNOSIS — R350 Frequency of micturition: Secondary | ICD-10-CM | POA: Diagnosis not present

## 2018-12-06 DIAGNOSIS — I5042 Chronic combined systolic (congestive) and diastolic (congestive) heart failure: Secondary | ICD-10-CM | POA: Diagnosis not present

## 2018-12-06 NOTE — Progress Notes (Signed)
CC: repeat BMP   HPI:  [name] is a 68 y.o. with PMH as below presenting today for followup. Admitted 2/11-2/14 for HF exacerbation with follow up 2/11. She presents again today to reassess BP, BMP and volume status. Medications include lisinopril 40 mg qd, metoprolol 100 mg qd, and spironolactone 12.5 mg qd. BMP at last appointment showed minor AKI.   Please see A&P for assessment of the patient's acute and chronic medical conditions.    Past Medical History:  Diagnosis Date  . ATN (acute tubular necrosis) ([place]) 07/15/2014  . Automatic implantable cardioverter-defibrillator in situ   . Automatic implantable cardioverter-defibrillator in situ 05/04/2007   Qualifier: Diagnosis of  By: Isla Pence    . Cardiac defibrillator in situ    Atlas II VR (SJM) implanted by Dr Lovena Le  . CHF (congestive heart failure) ([place])   . Chronic combined systolic and diastolic heart failure (HCC)    a. EF 35-40% in past;  b. Echo 7/13:  EF 45-50%, Gr 2 diast dysfn, mild AI, mild MAC, trivial MR, mild LAE, PASP 47.  [name] Chronic ulcer of leg ([place])    04-09-15 resolved-not a problem.  . Colon polyps 04/12/2013   Rectosigmoid polyp  . COPD (chronic obstructive pulmonary disease) (HCC)    O2 at night  . Depression   . Diabetes mellitus   . Diabetes mellitus, type 2 ([place]) 04/03/2012   HIGH RISK FEET.. Please have patient take shoes and socks off every visit for visual foot inspection.    . Eczema   . Elevated alkaline phosphatase level    GGT and 5'nucleotidase 8/13 normal  . Health care maintenance 01/22/2013   Surgically absent cervix- no pap needed (Path report 07/2000: uterine body with attached bilateral adnexa and separate cervix.)  . History of oxygen administration    oxygen @ 2 l/m nasally bedtime 24/7  . HTN (hypertension)   . Hx of cardiac cath    a. [place] 2003 normal;  b. LHC 6/13:  Mild calcification in the LM, o/w normal coronary arteries, EF 45%.   . Hyperkalemia 08/08/2017  .  Hyperlipidemia   . Hyperlipidemia associated with type 2 diabetes mellitus ([place]) 05/25/2007   Qualifier: Diagnosis of  By: Hassell Done FNP, Tori Milks    . Hyperthyroidism, subclinical   . Implantable cardioverter-defibrillator (ICD) generator end of life   . Lipoma   . NICM (nonischemic cardiomyopathy) ([place])   . Obesity   . On home oxygen therapy    "2L; 24/7" (10/11/2016)  . Post menopausal syndrome   . Shortness of breath 07/14/2017  . Skin rash 07/12/2018  . Sleep apnea    pt denies 04/12/2013   Review of Systems:   Review of Systems  Constitutional: Negative for malaise/fatigue and weight loss.  Respiratory: Negative for cough, shortness of breath and wheezing.   Cardiovascular: Negative for chest pain, orthopnea and leg swelling.  Gastrointestinal: Negative for constipation, diarrhea and nausea.  Genitourinary: Positive for frequency. Negative for dysuria, flank pain and hematuria.   Physical Exam:  Constitution: NAD, obese, pleasant Cardio: no m/r/g, RRR, no JVD Respiratory: CTA, no w/r/r  Abdominal: NTTP, soft  MSK: trace edema, moving all extremities    Vitals:   12/06/18 0950  BP: 115/65  Pulse: 90  Temp: 97.8 F (36.6 C)  TempSrc: Oral  SpO2: 98%  Weight: 201 lb 14.4 oz (91.6 kg)     Assessment & Plan:   See Encounters Tab for problem based charting.  Patient  discussed with Dr. Dareen Piano

## 2018-12-06 NOTE — Progress Notes (Signed)
Internal Medicine Clinic Attending  Case discussed with Dr. Seawell at the time of the visit.  We reviewed the resident's history and exam and pertinent patient test results.  I agree with the assessment, diagnosis, and plan of care documented in the resident's note.    

## 2018-12-06 NOTE — Patient Instructions (Signed)
Thank you for allowing [place] to provide your care today. Today we discussed your congestive heart failure and acute kidney injury.     I have ordered the following labs for you:  Basic metabolic panel    I will call if any are abnormal and we will discuss possible medication changes at that time.   Today we made the following changes to your medications:   If results are abnormal, I will have you follow-up in one week.     Should you have any questions or concerns please call the internal medicine clinic at [phone].

## 2018-12-06 NOTE — Assessment & Plan Note (Signed)
BP Readings from Last 3 Encounters:  12/06/18 115/65  11/28/18 120/68  11/27/18 110/72   Admitted 2/11-2/14 for HF exacerbation with follow up 2/26 which showed AKI. She presents again today to reassess BP, BMP and volume status. Medications include lisinopril 40 mg qd, metoprolol 100 mg qd, and spironolactone 12.5 mg qd (which was decreased from 25 mg qd prior to admission. Creatinine at previous visit was 1.35. She does have some weight gain and trace edema on exam but no JVD or SOB and states she is urinating very frequently which is requiring her to get out of bed frequently throughout the night.    - obtain BMP - may be overdiuresing - if continues to be elevated will decrease lasix and have her follow-up in one week.  - encouraged her to buy new batteries for scale to weigh herself daily

## 2018-12-07 LAB — BMP8+ANION GAP
Anion Gap: 17 mmol/L (ref 10.0–18.0)
BUN/Creatinine Ratio: 34 — ABNORMAL HIGH (ref 12–28)
BUN: 42 mg/dL — ABNORMAL HIGH (ref 8–27)
CO2: 24 mmol/L (ref 20–29)
Calcium: 9.7 mg/dL (ref 8.7–10.3)
Chloride: 99 mmol/L (ref 96–106)
Creatinine, Ser: 1.22 mg/dL — ABNORMAL HIGH (ref 0.57–1.00)
GFR calc Af Amer: 54 mL/min/{1.73_m2} — ABNORMAL LOW (ref >59)
GFR calc non Af Amer: 47 mL/min/{1.73_m2} — ABNORMAL LOW (ref >59)
Glucose: 144 mg/dL — ABNORMAL HIGH (ref 65–99)
Potassium: 4.5 mmol/L (ref 3.5–5.2)
Sodium: 140 mmol/L (ref 134–144)

## 2018-12-12 ENCOUNTER — Other Ambulatory Visit: Payer: Self-pay | Admitting: Internal Medicine

## 2018-12-12 ENCOUNTER — Ambulatory Visit: Payer: Medicare Other | Admitting: Internal Medicine

## 2018-12-12 DIAGNOSIS — I10 Essential (primary) hypertension: Secondary | ICD-10-CM

## 2018-12-12 DIAGNOSIS — E1142 Type 2 diabetes mellitus with diabetic polyneuropathy: Secondary | ICD-10-CM

## 2018-12-12 DIAGNOSIS — Z794 Long term (current) use of insulin: Secondary | ICD-10-CM

## 2018-12-12 NOTE — Telephone Encounter (Signed)
Pt has an appt in [facility] on 3/25; 5/20 with PCP.

## 2018-12-14 MED ORDER — LIRAGLUTIDE 18 MG/3ML ~~LOC~~ SOPN: 1.8000 mg | PEN_INJECTOR | Freq: Every day | SUBCUTANEOUS | 3 refills | Status: DC

## 2018-12-26 ENCOUNTER — Ambulatory Visit: Payer: Medicare Other

## 2018-12-28 DIAGNOSIS — J449 Chronic obstructive pulmonary disease, unspecified: Secondary | ICD-10-CM | POA: Diagnosis not present

## 2018-12-28 DIAGNOSIS — I509 Heart failure, unspecified: Secondary | ICD-10-CM | POA: Diagnosis not present

## 2019-01-28 DIAGNOSIS — I509 Heart failure, unspecified: Secondary | ICD-10-CM | POA: Diagnosis not present

## 2019-01-28 DIAGNOSIS — J449 Chronic obstructive pulmonary disease, unspecified: Secondary | ICD-10-CM | POA: Diagnosis not present

## 2019-02-07 ENCOUNTER — Other Ambulatory Visit: Payer: Self-pay

## 2019-02-07 ENCOUNTER — Ambulatory Visit (INDEPENDENT_AMBULATORY_CARE_PROVIDER_SITE_OTHER): Payer: Medicare Other | Admitting: Internal Medicine

## 2019-02-07 DIAGNOSIS — M62838 Other muscle spasm: Secondary | ICD-10-CM

## 2019-02-07 MED ORDER — CYCLOBENZAPRINE HCL 5 MG PO TABS: 5.0000 mg | ORAL_TABLET | Freq: Every evening | ORAL | 0 refills | Status: DC | PRN

## 2019-02-07 NOTE — Progress Notes (Signed)
CC: evaluation of shoulder knot   HPI:  [name] is a 68 y.o. with PMH as listed below who presents for evaluation of a knot in the shoulder. Please see the assessment and plans for the status of the patient chronic medical problems.   Past Medical History:  Diagnosis Date  . ATN (acute tubular necrosis) ([place]) 07/15/2014  . Automatic implantable cardioverter-defibrillator in situ   . Automatic implantable cardioverter-defibrillator in situ 05/04/2007   Qualifier: Diagnosis of  By: Isla Pence    . Cardiac defibrillator in situ    Atlas II VR (SJM) implanted by Dr Lovena Le  . CHF (congestive heart failure) ([place])   . Chronic combined systolic and diastolic heart failure (HCC)    a. EF 35-40% in past;  b. Echo 7/13:  EF 45-50%, Gr 2 diast dysfn, mild AI, mild MAC, trivial MR, mild LAE, PASP 47.  [name] Chronic ulcer of leg ([place])    04-09-15 resolved-not a problem.  . Colon polyps 04/12/2013   Rectosigmoid polyp  . COPD (chronic obstructive pulmonary disease) (HCC)    O2 at night  . Depression   . Diabetes mellitus   . Diabetes mellitus, type 2 ([place]) 04/03/2012   HIGH RISK FEET.. Please have patient take shoes and socks off every visit for visual foot inspection.    . Eczema   . Elevated alkaline phosphatase level    GGT and 5'nucleotidase 8/13 normal  . Health care maintenance 01/22/2013   Surgically absent cervix- no pap needed (Path report 07/2000: uterine body with attached bilateral adnexa and separate cervix.)  . History of oxygen administration    oxygen @ 2 l/m nasally bedtime 24/7  . HTN (hypertension)   . Hx of cardiac cath    a. [place] 2003 normal;  b. LHC 6/13:  Mild calcification in the LM, o/w normal coronary arteries, EF 45%.   . Hyperkalemia 08/08/2017  . Hyperlipidemia   . Hyperlipidemia associated with type 2 diabetes mellitus ([place]) 05/25/2007   Qualifier: Diagnosis of  By: Hassell Done FNP, Tori Milks    . Hyperthyroidism, subclinical   . Implantable  cardioverter-defibrillator (ICD) generator end of life   . Lipoma   . NICM (nonischemic cardiomyopathy) ([place])   . Obesity   . On home oxygen therapy    "2L; 24/7" (10/11/2016)  . Post menopausal syndrome   . Shortness of breath 07/14/2017  . Skin rash 07/12/2018  . Sleep apnea    pt denies 04/12/2013   Review of Systems:  Refer to history of present illness and assessment and plans for pertinent review of systems, all others reviewed and negative   Physical Exam:  Vitals:   02/07/19 1109  BP: (!) 159/86  Pulse: 100  Temp: 97.7 F (36.5 C)  TempSrc: Oral  SpO2: 100%  Weight: 205 lb (93 kg)  Height: _0  (1.549 m)   General: well appearing, not in acute distress  MSK: muscle spasm of the left trapezius  Neuro: normal strength and movement of the upper extremities, no numbness   Assessment & Plan:   Trapezius muscle spasm  Patient began having tension in the left shoulder about three weeks ago. She tried using tylenol, this helped initially but stopped helping. She has used a heating pad, it only offers minimal relief. She has very intermittent neck pain and tingling that radiates down either arm however this predates the muscle spasm and she has not had a flair of either of these recently. Massage has helped.  A: Exam consistent with muscle spasm of the trapezius.   P: encouraged muscle soothing cream such as bengay or tiger balm  One time course of flexeril to be used only as needed, discussed the sedating side effects and she will only take this at night.   See Encounters Tab for problem based charting.  Patient discussed with Dr. Beryle Beams

## 2019-02-07 NOTE — Patient Instructions (Addendum)
Thank you for coming to the clinic today. It was a pleasure to see you.   For your muscle spasm  Pick up a topical muscle cream like bengay or tiger balm and use it to massage your shoulder  I have sent in a prescription for flexeril - this can make you feel sleepy or groggy so only use it when the pain is very bad and try to use it at night   Please call the internal medicine center clinic if you have any questions or concerns, we may be able to help and keep you from a long and expensive emergency room wait. Our clinic and after hours phone number is [phone], the best time to call is Monday through Friday 9 am to 4 pm but there is always someone available 24/7 if you have an emergency. If you need medication refills please notify your pharmacy one week in advance and they will send [place] a request.

## 2019-02-08 ENCOUNTER — Encounter: Payer: Self-pay | Admitting: Internal Medicine

## 2019-02-08 DIAGNOSIS — M62838 Other muscle spasm: Secondary | ICD-10-CM | POA: Insufficient documentation

## 2019-02-08 NOTE — Assessment & Plan Note (Signed)
Patient began having tension in the left shoulder about three weeks ago. She tried using tylenol, this helped initially but stopped helping. She has used a heating pad, it only offers minimal relief. She has very intermittent neck pain and tingling that radiates down either arm however this predates the muscle spasm and she has not had a flair of either of these recently. Massage has helped.   A: Exam consistent with muscle spasm of the trapezius.   P: encouraged muscle soothing cream such as bengay or tiger balm  One time course of flexeril to be used only as needed, discussed the sedating side effects and she will only take this at night.

## 2019-02-11 NOTE — Progress Notes (Signed)
Medicine attending: Medical history, presenting problems, physical findings, and medications, reviewed with resident physician Dr Ledell Noss on the day of the patient visit and I concur with her evaluation and management plan. Multiple med issues but asks to be seen today for neck pain. Eval by Dr Hetty Ely c/w muscle spasm. Will Rx as such.

## 2019-02-19 ENCOUNTER — Telehealth: Payer: Self-pay

## 2019-02-19 DIAGNOSIS — Z794 Long term (current) use of insulin: Secondary | ICD-10-CM

## 2019-02-19 DIAGNOSIS — E1142 Type 2 diabetes mellitus with diabetic polyneuropathy: Secondary | ICD-10-CM

## 2019-02-19 NOTE — Telephone Encounter (Signed)
[name] is a 68 y.o. female who was contacted on behalf of [facility] Geriatrics Task Force.   Diabetes Assessment  DM meds and BS checks -  "What medications are you taking for diabetes?"  Victoza & Synjardy -  "How often do you check your blood sugars at home?" 3 times a day - "What have your blood sugars been?"  Typically in the low 100s (105-110), no <70, ate some donoughts and was in the 200s - once every 2 or 3 weeks  High Blood Pressure Assessment  BP meds & BP checks -  "What medications are you taking for high blood pressure?"  Spironolactone, metoprolol succinate, furosemide BID, lisinopril - "How often do you check your blood pressure at home?" just got a new machine  - "What have your blood pressure readings been?" have not checked today  Coping with DM and BP - What else are you doing to help with your DM and BP - Diet? n/a Exercise? Walking a little bit  Medication Access Issues  Medication Issues? -  "Are you getting your medicines refilled on time without skipping any doses?" yes - "Are you having any problems getting/taking your meds (cost, timing, transportation)?" no - daughter or friend takes to pick up medications - Do you need any meds refilled? No - just got some  Conclusion  Close the call - Date of follow-up visit scheduled - "Any other questions or concerns?" none at the moment - told the patient she may be contacted again soon regarding lab work

## 2019-02-20 ENCOUNTER — Ambulatory Visit (INDEPENDENT_AMBULATORY_CARE_PROVIDER_SITE_OTHER): Payer: Medicare Other | Admitting: Internal Medicine

## 2019-02-20 ENCOUNTER — Other Ambulatory Visit: Payer: Self-pay

## 2019-02-20 ENCOUNTER — Telehealth: Payer: Self-pay

## 2019-02-20 DIAGNOSIS — Z79899 Other long term (current) drug therapy: Secondary | ICD-10-CM

## 2019-02-20 DIAGNOSIS — Z7951 Long term (current) use of inhaled steroids: Secondary | ICD-10-CM

## 2019-02-20 DIAGNOSIS — I5042 Chronic combined systolic (congestive) and diastolic (congestive) heart failure: Secondary | ICD-10-CM

## 2019-02-20 DIAGNOSIS — J449 Chronic obstructive pulmonary disease, unspecified: Secondary | ICD-10-CM | POA: Diagnosis not present

## 2019-02-20 DIAGNOSIS — R21 Rash and other nonspecific skin eruption: Secondary | ICD-10-CM | POA: Diagnosis not present

## 2019-02-20 DIAGNOSIS — E1142 Type 2 diabetes mellitus with diabetic polyneuropathy: Secondary | ICD-10-CM

## 2019-02-20 DIAGNOSIS — Z794 Long term (current) use of insulin: Secondary | ICD-10-CM

## 2019-02-20 DIAGNOSIS — I110 Hypertensive heart disease with heart failure: Secondary | ICD-10-CM

## 2019-02-20 DIAGNOSIS — I10 Essential (primary) hypertension: Secondary | ICD-10-CM

## 2019-02-20 MED ORDER — ALBUTEROL SULFATE (2.5 MG/3ML) 0.083% IN NEBU: 2.5000 mg | INHALATION_SOLUTION | Freq: Four times a day (QID) | RESPIRATORY_TRACT | 12 refills | Status: DC | PRN

## 2019-02-20 NOTE — Assessment & Plan Note (Signed)
  CHF: Denied weight gain, denied dyspnea, denied orthopnea, BLLE edema.  Patient states that she has been compliant with medications Down to 200lbs from 214 in January.  This continues since initiation of the SGLT2 inhibitor. Most recent Scr increased. Would need BMP to monitor potassium and Scr given the relative high dose of loop diuretic.   Continue furosemide milligrams twice daily Continue beta-blocker, ACE, and Aldactone agonist Will need BMP in 1-2 months

## 2019-02-20 NOTE — Assessment & Plan Note (Signed)
Received letter to meet with Dermatologist in June 2020. Rash currently improved and stable.

## 2019-02-20 NOTE — Progress Notes (Signed)
[facility] Internal Medicine Residency Telephone Encounter Continuity Care Appointment  HPI:   This telephone encounter was created for [name] on 02/20/2019 for the following purpose/cc chronic medical conditions as per assessment and plan.   Past Medical History:  Past Medical History:  Diagnosis Date  . ATN (acute tubular necrosis) ([place]) 07/15/2014  . Automatic implantable cardioverter-defibrillator in situ   . Automatic implantable cardioverter-defibrillator in situ 05/04/2007   Qualifier: Diagnosis of  By: Isla Pence    . Cardiac defibrillator in situ    Atlas II VR (SJM) implanted by Dr Lovena Le  . CHF (congestive heart failure) ([place])   . Chronic combined systolic and diastolic heart failure (HCC)    a. EF 35-40% in past;  b. Echo 7/13:  EF 45-50%, Gr 2 diast dysfn, mild AI, mild MAC, trivial MR, mild LAE, PASP 47.  [name] Chronic ulcer of leg ([place])    04-09-15 resolved-not a problem.  . Colon polyps 04/12/2013   Rectosigmoid polyp  . COPD (chronic obstructive pulmonary disease) (HCC)    O2 at night  . Depression   . Diabetes mellitus   . Diabetes mellitus, type 2 ([place]) 04/03/2012   HIGH RISK FEET.. Please have patient take shoes and socks off every visit for visual foot inspection.    . Eczema   . Elevated alkaline phosphatase level    GGT and 5'nucleotidase 8/13 normal  . Health care maintenance 01/22/2013   Surgically absent cervix- no pap needed (Path report 07/2000: uterine body with attached bilateral adnexa and separate cervix.)  . History of oxygen administration    oxygen @ 2 l/m nasally bedtime 24/7  . HTN (hypertension)   . Hx of cardiac cath    a. [place] 2003 normal;  b. LHC 6/13:  Mild calcification in the LM, o/w normal coronary arteries, EF 45%.   . Hyperkalemia 08/08/2017  . Hyperlipidemia   . Hyperlipidemia associated with type 2 diabetes mellitus ([place]) 05/25/2007   Qualifier: Diagnosis of  By: Hassell Done FNP, Tori Milks    . Hyperthyroidism, subclinical    . Implantable cardioverter-defibrillator (ICD) generator end of life   . Lipoma   . NICM (nonischemic cardiomyopathy) ([place])   . Obesity   . On home oxygen therapy    "2L; 24/7" (10/11/2016)  . Post menopausal syndrome   . Shortness of breath 07/14/2017  . Skin rash 07/12/2018  . Sleep apnea    pt denies 04/12/2013      ROS:   Negative except as per HPI   Assessment / Plan / Recommendations:   Please see A&P under problem oriented charting for assessment of the patient's acute and chronic medical conditions.   As always, pt is advised that if symptoms worsen or new symptoms arise, they should go to an urgent care facility or to to ER for further evaluation.   Consent and Medical Decision Making:   Patient discussed with Dr. Dareen Piano  This is a telephone encounter between [name] and [name] on 02/20/2019 for her chronic conditions including diabetes, hypertension, and pulmonary disease. The visit was conducted with the patient located at home and [employer] at [facility]. The patient's identity was confirmed using their DOB and current address. The patient has consented to being evaluated through a telephone encounter and understands the associated risks (an examination cannot be done and the patient may need to come in for an appointment) / benefits (allows the patient to remain at home, decreasing exposure to coronavirus). I  personally spent 14 minutes on medical discussion.

## 2019-02-20 NOTE — Assessment & Plan Note (Signed)
COPD: Patient feels well on her current pulmonary treatment. Repeat PFT's from early this year indicating a diffusion defect and only mild restriction lessening the likelihood of COPD and more with a restrictive pattern.   Plan: Continue Anoro Ellipta 62.5-25MCH/INH AEPB with 1 puff daily Refilled Albuterol inhaler PRN

## 2019-02-20 NOTE — Assessment & Plan Note (Signed)
Hypertension: Patient's BP today is 150/101 with a goal of <140/80. The patient endorses adherence to her medication regimen. She denied, chest pain, headache, visual changes, lightheadedness, weakness, dizziness on standing, swelling in the feet or ankles.   Plan: Continue sprinolactone 12.5mg  daily Continue lisinopril 40mg  daily Continue metoprolol succinate 100mg  daily

## 2019-02-20 NOTE — Assessment & Plan Note (Signed)
  DMII: She denied weakness, confusion, diaphoresis, headaches, visual changes, documented low glucose readings, average 110 daily.  She endorses adherence to her medication regimen as prescribed.  Continue Empagliflozin/metformin 5-1000mg  q24 Continue Victoza 1.8mg  daily Will need hemoglobin A1c in 1 to 2 months

## 2019-02-21 NOTE — Progress Notes (Signed)
Internal Medicine Clinic Attending  Case discussed with Dr. Harbrecht at the time of the visit.  We reviewed the resident's history and exam and pertinent patient test results.  I agree with the assessment, diagnosis, and plan of care documented in the resident's note.   

## 2019-02-21 NOTE — Telephone Encounter (Signed)
called and talked to patient regarding getting updated labs at [employer]

## 2019-02-27 DIAGNOSIS — J449 Chronic obstructive pulmonary disease, unspecified: Secondary | ICD-10-CM | POA: Diagnosis not present

## 2019-02-27 DIAGNOSIS — I509 Heart failure, unspecified: Secondary | ICD-10-CM | POA: Diagnosis not present

## 2019-02-28 ENCOUNTER — Other Ambulatory Visit: Payer: Self-pay

## 2019-02-28 ENCOUNTER — Ambulatory Visit (INDEPENDENT_AMBULATORY_CARE_PROVIDER_SITE_OTHER): Payer: Medicare Other | Admitting: *Deleted

## 2019-02-28 DIAGNOSIS — I5042 Chronic combined systolic (congestive) and diastolic (congestive) heart failure: Secondary | ICD-10-CM

## 2019-02-28 LAB — CUP PACEART INCLINIC DEVICE CHECK
Battery Remaining Longevity: 61 mo
Brady Statistic RV Percent Paced: 0 %
Date Time Interrogation Session: 20200528112420
HighPow Impedance: 47.7931
Implantable Lead Implant Date: 20080801
Implantable Lead Location: 753860
Implantable Lead Model: 7120
Implantable Pulse Generator Implant Date: 20150701
Lead Channel Impedance Value: 437.5 Ohm
Lead Channel Pacing Threshold Amplitude: 0.5 V
Lead Channel Pacing Threshold Amplitude: 0.5 V
Lead Channel Pacing Threshold Pulse Width: 0.5 ms
Lead Channel Pacing Threshold Pulse Width: 0.5 ms
Lead Channel Sensing Intrinsic Amplitude: 11.9 mV
Lead Channel Setting Pacing Amplitude: 2.5 V
Lead Channel Setting Pacing Pulse Width: 0.5 ms
Lead Channel Setting Sensing Sensitivity: 0.5 mV
Pulse Gen Serial Number: 7206538

## 2019-02-28 NOTE — Progress Notes (Signed)
ICD check in clinic. Normal device function. Thresholds and sensing consistent with previous device measurements. Impedance trends stable over time. No evidence of any ventricular arrhythmias. Histogram distribution appropriate for patient and level of activity. No changes made this session. Device programmed at appropriate safety margins. Device programmed to optimize intrinsic conduction. Estimated longevity 22yr, 32mo. Pt declined remote monitoring. Plan to check device every 3 months in office.

## 2019-03-06 ENCOUNTER — Telehealth: Payer: Self-pay

## 2019-03-06 DIAGNOSIS — Z794 Long term (current) use of insulin: Secondary | ICD-10-CM

## 2019-03-06 DIAGNOSIS — E1142 Type 2 diabetes mellitus with diabetic polyneuropathy: Secondary | ICD-10-CM

## 2019-03-14 ENCOUNTER — Other Ambulatory Visit (INDEPENDENT_AMBULATORY_CARE_PROVIDER_SITE_OTHER): Payer: Medicare Other

## 2019-03-14 ENCOUNTER — Other Ambulatory Visit: Payer: Self-pay

## 2019-03-14 DIAGNOSIS — E1142 Type 2 diabetes mellitus with diabetic polyneuropathy: Secondary | ICD-10-CM

## 2019-03-14 LAB — POCT GLYCOSYLATED HEMOGLOBIN (HGB A1C): Hemoglobin A1C: 9 % — AB (ref 4.0–5.6)

## 2019-03-14 LAB — GLUCOSE, CAPILLARY: Glucose-Capillary: 190 mg/dL — ABNORMAL HIGH (ref 70–99)

## 2019-03-15 LAB — MICROALBUMIN / CREATININE URINE RATIO
Creatinine, Urine: 198.5 mg/dL
Microalb/Creat Ratio: 21 mg/g creat (ref 0–29)
Microalbumin, Urine: 41.4 ug/mL

## 2019-03-15 MED ORDER — SYNJARDY XR 5-1000 MG PO TB24: 1.0000 | ORAL_TABLET | Freq: Two times a day (BID) | ORAL | 3 refills | Status: DC

## 2019-03-15 MED ORDER — VICTOZA 18 MG/3ML ~~LOC~~ SOPN: 1.8000 mg | PEN_INJECTOR | Freq: Every day | SUBCUTANEOUS | 3 refills | Status: DC

## 2019-03-15 NOTE — Telephone Encounter (Signed)
Working with patient on medication cost/access for [employer]

## 2019-03-20 ENCOUNTER — Other Ambulatory Visit: Payer: Self-pay | Admitting: Internal Medicine

## 2019-03-20 DIAGNOSIS — I10 Essential (primary) hypertension: Secondary | ICD-10-CM

## 2019-03-30 ENCOUNTER — Other Ambulatory Visit: Payer: Self-pay | Admitting: Internal Medicine

## 2019-03-30 DIAGNOSIS — J449 Chronic obstructive pulmonary disease, unspecified: Secondary | ICD-10-CM | POA: Diagnosis not present

## 2019-03-30 DIAGNOSIS — I509 Heart failure, unspecified: Secondary | ICD-10-CM | POA: Diagnosis not present

## 2019-04-04 ENCOUNTER — Other Ambulatory Visit: Payer: Self-pay

## 2019-04-24 ENCOUNTER — Other Ambulatory Visit: Payer: Self-pay

## 2019-04-24 ENCOUNTER — Encounter: Payer: Self-pay | Admitting: Internal Medicine

## 2019-04-24 ENCOUNTER — Ambulatory Visit (INDEPENDENT_AMBULATORY_CARE_PROVIDER_SITE_OTHER): Payer: Medicare Other | Admitting: Internal Medicine

## 2019-04-24 VITALS — BP 135/81 | HR 93 | Temp 98.2°F | Wt 201.2 lb

## 2019-04-24 DIAGNOSIS — E1142 Type 2 diabetes mellitus with diabetic polyneuropathy: Secondary | ICD-10-CM | POA: Diagnosis not present

## 2019-04-24 DIAGNOSIS — I5042 Chronic combined systolic (congestive) and diastolic (congestive) heart failure: Secondary | ICD-10-CM | POA: Diagnosis not present

## 2019-04-24 DIAGNOSIS — E119 Type 2 diabetes mellitus without complications: Secondary | ICD-10-CM | POA: Diagnosis not present

## 2019-04-24 DIAGNOSIS — Z1159 Encounter for screening for other viral diseases: Secondary | ICD-10-CM | POA: Diagnosis not present

## 2019-04-24 DIAGNOSIS — I110 Hypertensive heart disease with heart failure: Secondary | ICD-10-CM

## 2019-04-24 DIAGNOSIS — Z7951 Long term (current) use of inhaled steroids: Secondary | ICD-10-CM

## 2019-04-24 DIAGNOSIS — I10 Essential (primary) hypertension: Secondary | ICD-10-CM | POA: Diagnosis not present

## 2019-04-24 DIAGNOSIS — Z79899 Other long term (current) drug therapy: Secondary | ICD-10-CM

## 2019-04-24 DIAGNOSIS — J449 Chronic obstructive pulmonary disease, unspecified: Secondary | ICD-10-CM

## 2019-04-24 DIAGNOSIS — Z7984 Long term (current) use of oral hypoglycemic drugs: Secondary | ICD-10-CM

## 2019-04-24 LAB — GLUCOSE, CAPILLARY: Glucose-Capillary: 276 mg/dL — ABNORMAL HIGH (ref 70–99)

## 2019-04-24 LAB — POCT GLYCOSYLATED HEMOGLOBIN (HGB A1C): Hemoglobin A1C: 8.7 % — AB (ref 4.0–5.6)

## 2019-04-24 NOTE — Patient Instructions (Signed)
FOLLOW-UP INSTRUCTIONS When: about 2-3 months For: Routine visit What to bring: All of your medications  I have not made any changes to your medications today.   Today we discussed your high blood pressure, diabetes, heart failure, COPD and need for hepatitis C screening labs. I will notify you of the results of any labs from today's evaluation when available to me.   Thank you for your visit to the [facility] today. If you have any questions or concerns please call us at [phone].

## 2019-04-24 NOTE — Progress Notes (Signed)
   CC: DMII, HTN, COPD  HPI:[name] is a 68 y.o. female who presents for evaluation of COPD, hypertension, diabetes. Please see individual problem based A/P for details.  Past Medical History:  Diagnosis Date  . ATN (acute tubular necrosis) ([place]) 07/15/2014  . Automatic implantable cardioverter-defibrillator in situ   . Automatic implantable cardioverter-defibrillator in situ 05/04/2007   Qualifier: Diagnosis of  By: Isla Pence    . Cardiac defibrillator in situ    Atlas II VR (SJM) implanted by Dr Lovena Le  . CHF (congestive heart failure) ([place])   . Chronic combined systolic and diastolic heart failure (HCC)    a. EF 35-40% in past;  b. Echo 7/13:  EF 45-50%, Gr 2 diast dysfn, mild AI, mild MAC, trivial MR, mild LAE, PASP 47.  [name] Chronic ulcer of leg ([place])    04-09-15 resolved-not a problem.  . Colon polyps 04/12/2013   Rectosigmoid polyp  . COPD (chronic obstructive pulmonary disease) (HCC)    O2 at night  . Depression   . Diabetes mellitus   . Diabetes mellitus, type 2 ([place]) 04/03/2012   HIGH RISK FEET.. Please have patient take shoes and socks off every visit for visual foot inspection.    . Eczema   . Elevated alkaline phosphatase level    GGT and 5'nucleotidase 8/13 normal  . Health care maintenance 01/22/2013   Surgically absent cervix- no pap needed (Path report 07/2000: uterine body with attached bilateral adnexa and separate cervix.)  . History of oxygen administration    oxygen @ 2 l/m nasally bedtime 24/7  . HTN (hypertension)   . Hx of cardiac cath    a. [place] 2003 normal;  b. LHC 6/13:  Mild calcification in the LM, o/w normal coronary arteries, EF 45%.   . Hyperkalemia 08/08/2017  . Hyperlipidemia   . Hyperlipidemia associated with type 2 diabetes mellitus ([place]) 05/25/2007   Qualifier: Diagnosis of  By: Hassell Done FNP, Tori Milks    . Hyperthyroidism, subclinical   . Implantable cardioverter-defibrillator (ICD) generator end of life   . Lipoma   . NICM  (nonischemic cardiomyopathy) ([place])   . Obesity   . On home oxygen therapy    "2L; 24/7" (10/11/2016)  . Post menopausal syndrome   . Shortness of breath 07/14/2017  . Skin rash 07/12/2018  . Sleep apnea    pt denies 04/12/2013   Review of Systems:  ROS negative except as per HPI.  Physical Exam: Vitals:   04/24/19 1447  BP: 135/81  Pulse: 93  Temp: 98.2 F (36.8 C)  TempSrc: Oral  SpO2: 100%  Weight: 201 lb 3.2 oz (91.3 kg)   General: In no acute distress, afebrile, nondiaphoretic HEENT: PEERL, EMO intact Cardio: RRR, no mrg's  Pulmonary: CTA bilaterally, no wheezing or crackles  Abdomen: Bowel sounds normal, soft, nontender  MSK: BLE nontender, nonedematous Neuro: Alert, normal gait Psych: Appropriate affect, not depressed in appearance, engages well  Assessment & Plan:   See Encounters Tab for problem based charting.  Patient discussed with Dr. He[place].

## 2019-04-25 ENCOUNTER — Encounter: Payer: Self-pay | Admitting: Internal Medicine

## 2019-04-25 DIAGNOSIS — Z1159 Encounter for screening for other viral diseases: Secondary | ICD-10-CM | POA: Insufficient documentation

## 2019-04-25 HISTORY — DX: Encounter for screening for other viral diseases: Z11.59

## 2019-04-25 LAB — HEPATITIS C ANTIBODY: Hep C Virus Ab: <0.1 s/co ratio (ref 0.0–0.9)

## 2019-04-25 LAB — LIPID PANEL
Chol/HDL Ratio: 2.1 ratio (ref 0.0–4.4)
Cholesterol, Total: 163 mg/dL (ref 100–199)
HDL: 76 mg/dL (ref >39)
LDL Calculated: 55 mg/dL (ref 0–99)
Triglycerides: 160 mg/dL — ABNORMAL HIGH (ref 0–149)
VLDL Cholesterol Cal: 32 mg/dL (ref 5–40)

## 2019-04-25 LAB — BMP8+ANION GAP
Anion Gap: 19 mmol/L — ABNORMAL HIGH (ref 10.0–18.0)
BUN/Creatinine Ratio: 22 (ref 12–28)
BUN: 21 mg/dL (ref 8–27)
CO2: 22 mmol/L (ref 20–29)
Calcium: 9.4 mg/dL (ref 8.7–10.3)
Chloride: 99 mmol/L (ref 96–106)
Creatinine, Ser: 0.97 mg/dL (ref 0.57–1.00)
GFR calc Af Amer: 71 mL/min/{1.73_m2} (ref >59)
GFR calc non Af Amer: 61 mL/min/{1.73_m2} (ref >59)
Glucose: 273 mg/dL — ABNORMAL HIGH (ref 65–99)
Potassium: 4.7 mmol/L (ref 3.5–5.2)
Sodium: 140 mmol/L (ref 134–144)

## 2019-04-25 NOTE — Assessment & Plan Note (Signed)
Hepatitis C screening: Need for Hep C screen.  Plan:  Ordered screening antibody test  Resulted negative

## 2019-04-25 NOTE — Assessment & Plan Note (Addendum)
DMII: Hgb A1c 9.0 % at the last recording 6 weeks prior. Current A1c 8.7 %. The time between readings I feel this indicates improvement and should continue to be monitored on her current regimen.  The patient denied polyuria, polydipsia, headache, fatigue, confusion, nausea, vomiting or diaphoresis. Patient forgot her glucometer today her ride was late to pick her up.   Plan: Continue Empagliflozin-metformin combo 5-1000mg  BID Continue Liraglutide (GLP1) 1.8mg  daily Repeat A1c at next visit  Lipid Panel to monitor response to statin  Addendum: sCr improved to 0.97 from 1.22 prior. Lipid profile with elevated triglycerides, will advise dietary modification

## 2019-04-25 NOTE — Assessment & Plan Note (Signed)
CHF Combined: Denied dyspnea, orthopnea, weight gain, urinary frequency or decreased urination.  Continue furosemide 80 mg daily, beta-blocker, statin, ACE inhibitor, and SGLT 2 inhibitor

## 2019-04-25 NOTE — Assessment & Plan Note (Signed)
COPD: Denied shortness of breath, cough, fever, sputum production or medication concerns. States good compliance and demonstrates proper inhaler technique.    Plan:  Continue Anoro Ellipta 62.5-25mch/Inh one puff daily Continue PRN albuterol inhaler

## 2019-04-25 NOTE — Assessment & Plan Note (Signed)
Hypertension: Patient's BP today is 135/81 with a goal of <140/80. The patient endorses adherence to her medication regimen.  She denied, chest pain, headache, visual changes, lightheadedness, weakness, dizziness on standing, swelling in the feet or ankles.   Plan: Continue lisinopril 40 mg daily Continue metoprolol 50mg  daily Continue spironolactone 12.5mg  daily

## 2019-04-26 NOTE — Progress Notes (Signed)
Internal Medicine Clinic Attending  Case discussed with Dr. Harbrecht at the time of the visit.  We reviewed the resident's history and exam and pertinent patient test results.  I agree with the assessment, diagnosis, and plan of care documented in the resident's note.   

## 2019-04-29 DIAGNOSIS — J449 Chronic obstructive pulmonary disease, unspecified: Secondary | ICD-10-CM | POA: Diagnosis not present

## 2019-04-29 DIAGNOSIS — I509 Heart failure, unspecified: Secondary | ICD-10-CM | POA: Diagnosis not present

## 2019-05-08 ENCOUNTER — Telehealth: Payer: Self-pay | Admitting: *Deleted

## 2019-05-08 NOTE — Telephone Encounter (Signed)
Call to Ms. Brigitte Pulse at request of triage nurse about her reported problems with Synjardy XR. Ms. Uselton says she has not taken Synjardy XR in a week or more  because it "runs her to the bathroom all day". She says "metformin does me the same way". Sh reports having taken Synjardy XR for several weeks to see if she'd get used to it, but never did, continued to run to the bathroom with loose stools. She says the years that she has been on metformin she took it intermittently because it also ran her to the bathroom during that time.   She is checking her blood sugars 2-3 times a day with the following results: 150-160 sometimes more, in the afternoon high sometimes over 2, says this am it was 151 Lab Results  Component Value Date   HGBA1C 8.7 (A) 04/24/2019   HGBA1C 9.0 (A) 03/14/2019   HGBA1C 9.3 (A) 10/08/2018    Takes victoza 1.8 once day- she says "It's good, I am losing weight"  She would be happy to take Jardiance without the metformin. I told her that Dr. Berline Lopes may want her to come in for an appointment. She verbalized understanding. Her next scheduled appointment is 07/03/19 with Dr. Berline Lopes.

## 2019-05-08 NOTE — Telephone Encounter (Signed)
Thank you Bonnita Nasuti. I appreciate the assistance with this. I will look into this further as well. Please keep me updated with any new information.

## 2019-05-08 NOTE — Telephone Encounter (Signed)
[employer] nurse calls and states she spoke to pt this am and in checking meds it was found that pt is not taking synjardy as she should. Pt told [facility] nurse that she doesn't like to take it because she gets diarrhea. Nurse ask if it could be changed to something else. Will send to donnaP., dr Maudie Mercury and dr Berline Lopes to see what they think

## 2019-05-10 NOTE — Telephone Encounter (Signed)
Called patient on 8/6 and 8/7. Left voicemail to call back. Need to discuss her diabetes medications and potential changes to these.  Please notify me if she returns my call.

## 2019-05-28 ENCOUNTER — Other Ambulatory Visit: Payer: Self-pay | Admitting: Internal Medicine

## 2019-05-28 DIAGNOSIS — E1142 Type 2 diabetes mellitus with diabetic polyneuropathy: Secondary | ICD-10-CM

## 2019-05-28 DIAGNOSIS — I10 Essential (primary) hypertension: Secondary | ICD-10-CM

## 2019-05-28 DIAGNOSIS — Z794 Long term (current) use of insulin: Secondary | ICD-10-CM

## 2019-05-29 ENCOUNTER — Telehealth: Payer: Self-pay

## 2019-05-29 NOTE — Telephone Encounter (Signed)
Pt answered no to all covid-19 prescreening questions. I asked the pt to wear a mask to her appointment. I let the pt know we are reducing the number of people coming into the office and if she can physically come into her appointment alone to please do so. I told her if anything changes between now and her appointment time to please call to let us know. The pt verbalized understanding and thanked me for the call.

## 2019-05-29 NOTE — Telephone Encounter (Signed)
    COVID-19 Pre-Screening Questions:  . In the past 7 to 10 days have you had a cough,  shortness of breath, headache, congestion, fever (100 or greater) body aches, chills, sore throat, or sudden loss of taste or sense of smell? . Have you been around anyone with known Covid 19. . Have you been around anyone who is awaiting Covid 19 test results in the past 7 to 10 days? . Have you been around anyone who has been exposed to Covid 19, or has mentioned symptoms of Covid 19 within the past 7 to 10 days?  If you have any concerns/questions about symptoms patients report during screening (either on the phone or at threshold). Contact the provider seeing the patient or DOD for further guidance.  If neither are available contact a member of the leadership team.        LMOVM    

## 2019-05-29 NOTE — Telephone Encounter (Signed)
Patient returned call

## 2019-05-30 ENCOUNTER — Other Ambulatory Visit: Payer: Self-pay

## 2019-05-30 ENCOUNTER — Ambulatory Visit (INDEPENDENT_AMBULATORY_CARE_PROVIDER_SITE_OTHER): Payer: Medicare Other | Admitting: *Deleted

## 2019-05-30 DIAGNOSIS — I428 Other cardiomyopathies: Secondary | ICD-10-CM | POA: Diagnosis not present

## 2019-05-30 DIAGNOSIS — Z95810 Presence of automatic (implantable) cardiac defibrillator: Secondary | ICD-10-CM

## 2019-05-30 DIAGNOSIS — I5042 Chronic combined systolic (congestive) and diastolic (congestive) heart failure: Secondary | ICD-10-CM | POA: Diagnosis not present

## 2019-05-30 DIAGNOSIS — I509 Heart failure, unspecified: Secondary | ICD-10-CM | POA: Diagnosis not present

## 2019-05-30 DIAGNOSIS — J449 Chronic obstructive pulmonary disease, unspecified: Secondary | ICD-10-CM | POA: Diagnosis not present

## 2019-05-31 LAB — CUP PACEART INCLINIC DEVICE CHECK
Battery Remaining Longevity: 60 mo
Brady Statistic RV Percent Paced: 0 %
Date Time Interrogation Session: 20200827143216
HighPow Impedance: 49.4824
Implantable Lead Implant Date: 20080801
Implantable Lead Location: 753860
Implantable Lead Model: 7120
Implantable Pulse Generator Implant Date: 20150701
Lead Channel Impedance Value: 437.5 Ohm
Lead Channel Pacing Threshold Amplitude: 0.5 V
Lead Channel Pacing Threshold Pulse Width: 0.5 ms
Lead Channel Sensing Intrinsic Amplitude: 11.9 mV
Lead Channel Setting Pacing Amplitude: 2.5 V
Lead Channel Setting Pacing Pulse Width: 0.5 ms
Lead Channel Setting Sensing Sensitivity: 0.5 mV
Pulse Gen Serial Number: 7206538

## 2019-05-31 NOTE — Progress Notes (Signed)
ICD check in clinic. Normal device function. Threshold and sensing consistent with previous device measurements. Impedance trends stable over time. 2 NSVT episodes--available EGM shows VT hovering at detection (CLs 281-36ms), episode self-terminated, patient asymptomatic. Histogram distribution appropriate for patient and level of activity. CorVue currently at baseline. Device programmed at appropriate safety margins; per GT, lowered VT detection rate to 190bpm (368ms). Device programmed to optimize intrinsic conduction. Estimated longevity 5.0 years. Pt declines Merlin montioring. Patient education completed including shock plan. Alert vibration demonstrated for patient. ROV with DC on 09/10/19 and ROV with GT in 11/2019.

## 2019-06-18 NOTE — Progress Notes (Signed)
   CC: HTN, Restrictive lung disease  HPI:Ms.AELLA RONDA is a 68 y.o. female who presents for evaluation of HTN and back pain. Please see individual problem based A/P for details.  Depression, PHQ-9: Based on the patients 0 score we have decided to monitor.  Past Medical History:  Diagnosis Date  . ATN (acute tubular necrosis) ([place]) 07/15/2014  . Automatic implantable cardioverter-defibrillator in situ   . Automatic implantable cardioverter-defibrillator in situ 05/04/2007   Qualifier: Diagnosis of  By: Isla Pence    . Cardiac defibrillator in situ    Atlas II VR ([facility]) implanted by Dr Lovena Le  . CHF (congestive heart failure) ([place])   . Chronic combined systolic and diastolic heart failure (HCC)    a. EF 35-40% in past;  b. Echo 7/13:  EF 45-50%, Gr 2 diast dysfn, mild AI, mild MAC, trivial MR, mild LAE, PASP 47.  [name] Chronic ulcer of leg ([place])    04-09-15 resolved-not a problem.  . Colon polyps 04/12/2013   Rectosigmoid polyp  . COPD (chronic obstructive pulmonary disease) (HCC)    O2 at night  . Depression   . Diabetes mellitus   . Diabetes mellitus, type 2 ([place]) 04/03/2012   HIGH RISK FEET.. Please have patient take shoes and socks off every visit for visual foot inspection.    . Eczema   . Elevated alkaline phosphatase level    GGT and 5'nucleotidase 8/13 normal  . Health care maintenance 01/22/2013   Surgically absent cervix- no pap needed (Path report 07/2000: uterine body with attached bilateral adnexa and separate cervix.)  . History of oxygen administration    oxygen @ 2 l/m nasally bedtime 24/7  . HTN (hypertension)   . Hx of cardiac cath    a. [place] 2003 normal;  b. LHC 6/13:  Mild calcification in the LM, o/w normal coronary arteries, EF 45%.   . Hyperkalemia 08/08/2017  . Hyperlipidemia   . Hyperlipidemia associated with type 2 diabetes mellitus ([place]) 05/25/2007   Qualifier: Diagnosis of  By: Hassell Done FNP, Tori Milks    . Hyperthyroidism, subclinical   . Implantable  cardioverter-defibrillator (ICD) generator end of life   . Lipoma   . Need for hepatitis C screening test 04/25/2019  . NICM (nonischemic cardiomyopathy) ([place])   . Obesity   . On home oxygen therapy    "2L; 24/7" (10/11/2016)  . Post menopausal syndrome   . Shortness of breath 07/14/2017  . Skin rash 07/12/2018  . Sleep apnea    pt denies 04/12/2013   Review of Systems:  ROS negative except as per HPI.  Physical Exam: Vitals:   06/19/19 1405 06/19/19 1406 06/19/19 1444  BP:  (!) 150/94 (!) 153/89  Pulse:  92 92  Temp:  98.2 F (36.8 C)   TempSrc:  Oral   SpO2:  98%   Weight: 201 lb 11.2 oz (91.5 kg)    Height: _0  (1.549 m)     General: A/O x4, in no acute distress, afebrile, nondiaphoretic HEENT: PEERL, EMO intact Cardio: RRR, no mrg's  Pulmonary: CTA bilaterally,  no wheezing or crackles  Abdomen: Bowel sounds normal, soft, nontender  MSK: BLE nontender, nonedematous Psych: Appropriate affect, not depressed in appearance, engages well  Assessment & Plan:   See Encounters Tab for problem based charting.  Patient discussed with Dr. Evette Doffing

## 2019-06-19 ENCOUNTER — Ambulatory Visit (INDEPENDENT_AMBULATORY_CARE_PROVIDER_SITE_OTHER): Payer: Medicare Other | Admitting: Internal Medicine

## 2019-06-19 ENCOUNTER — Other Ambulatory Visit: Payer: Self-pay

## 2019-06-19 ENCOUNTER — Encounter: Payer: Self-pay | Admitting: Internal Medicine

## 2019-06-19 VITALS — BP 153/89 | HR 92 | Temp 98.2°F | Ht 61.0 in | Wt 201.7 lb

## 2019-06-19 DIAGNOSIS — I10 Essential (primary) hypertension: Secondary | ICD-10-CM | POA: Diagnosis not present

## 2019-06-19 DIAGNOSIS — Z23 Encounter for immunization: Secondary | ICD-10-CM

## 2019-06-19 DIAGNOSIS — F17200 Nicotine dependence, unspecified, uncomplicated: Secondary | ICD-10-CM

## 2019-06-19 DIAGNOSIS — S29012A Strain of muscle and tendon of back wall of thorax, initial encounter: Secondary | ICD-10-CM | POA: Diagnosis not present

## 2019-06-19 DIAGNOSIS — I5042 Chronic combined systolic (congestive) and diastolic (congestive) heart failure: Secondary | ICD-10-CM | POA: Diagnosis not present

## 2019-06-19 DIAGNOSIS — X58XXXA Exposure to other specified factors, initial encounter: Secondary | ICD-10-CM | POA: Diagnosis not present

## 2019-06-19 DIAGNOSIS — J984 Other disorders of lung: Secondary | ICD-10-CM

## 2019-06-19 DIAGNOSIS — I110 Hypertensive heart disease with heart failure: Secondary | ICD-10-CM | POA: Diagnosis not present

## 2019-06-19 DIAGNOSIS — Z7951 Long term (current) use of inhaled steroids: Secondary | ICD-10-CM

## 2019-06-19 DIAGNOSIS — Z720 Tobacco use: Secondary | ICD-10-CM

## 2019-06-19 DIAGNOSIS — S46819A Strain of other muscles, fascia and tendons at shoulder and upper arm level, unspecified arm, initial encounter: Secondary | ICD-10-CM | POA: Insufficient documentation

## 2019-06-19 DIAGNOSIS — S46811A Strain of other muscles, fascia and tendons at shoulder and upper arm level, right arm, initial encounter: Secondary | ICD-10-CM

## 2019-06-19 DIAGNOSIS — Z79899 Other long term (current) drug therapy: Secondary | ICD-10-CM

## 2019-06-19 LAB — GLUCOSE, CAPILLARY: Glucose-Capillary: 194 mg/dL — ABNORMAL HIGH (ref 70–99)

## 2019-06-19 MED ORDER — METOPROLOL SUCCINATE ER 50 MG PO TB24: 100.0000 mg | ORAL_TABLET | Freq: Every day | ORAL | 2 refills | Status: DC

## 2019-06-19 NOTE — Assessment & Plan Note (Signed)
Restrictive Lung Disease: PFT"s concerning for restrictive process in January 2020.  Subsequent completion of a high resolution CT with ground glass opacities and scattered mild interlobular septal thickening in both lung concerning for air trapping or hypersensitivity pneumonitis.  Patient's dyspnea is stable today.  Plan: Refer to pulmonology -Continue Anora Ellipta, the Umeclidinium-Vilanterol combo - Albuterol nebulizer PRN

## 2019-06-19 NOTE — Assessment & Plan Note (Signed)
Back pain: Upper trapezius, lateral to the right of T2/T3.  Mildly tender to palpation, sniffily tender in the area stressed with pulling motion.  This pains of minimal impact on her ADLs and only occasionally triggers symptoms.  Plan:  Intra-milligrams ibuprofen 3 times daily for 7 days scheduled Notify us if this has not relieved her symptoms We will order an MRI of the thoracic spine due to the concern for bony lesion versus other process given the unusual location if her symptoms do not resolve with conservative therapy

## 2019-06-19 NOTE — Assessment & Plan Note (Addendum)
Hypertension: Patient's BP today is 153/89 with a goal of <140/80. She stated that she will bring her home BP machine with her to compare to ours as she often has normal readings at home. The patient endorses adherence to her medication regimen. She denied, chest pain, headache, visual changes, lightheadedness, weakness, dizziness on standing, swelling in the feet or ankles.   Plan: Continue lisinopril 40mg  daily Continue spironolactone 0.5 of a 25mg  tablet mg daily so 12.5 Continue metoprolol succinate 50mg  24hr daily

## 2019-06-19 NOTE — Assessment & Plan Note (Signed)
Patient appropriate for influenza vaccination today.  She is agreeable.  Vaccine administered.

## 2019-06-19 NOTE — Assessment & Plan Note (Signed)
CHF: Symptoms stable. Denied edema of the feet or ankles.  Last echo done in October 2018 with LVEF of 25 to 30%.  ICD placed with most recent evaluation in August 2020 with normal device function noted.  Plan:  Continue Lasix, atorvastatin, lisinopril, empagliflozin, metoprolol and spironolactone

## 2019-06-19 NOTE — Assessment & Plan Note (Signed)
  Tobacco use disorder: Patient to endorses a smoking history of approximately 8 pack years.

## 2019-06-19 NOTE — Patient Instructions (Addendum)
Thank you for your visit to the [facility] today.  It was good to see you again.  I am glad to see that you are doing well.  Please return in approximately 2 to 3 months for a routine evaluation  For your lung disease I would like to send you to the pulmonary (lung doctor) clinic to discuss the possibilities.  For the back plain, please take 80 mg ibuprofen 3 times a day for 7 days.  If your pain does not improve or resolve, or if it worsens, please notify me so that we can continue the evaluation process.

## 2019-06-20 LAB — BMP8+ANION GAP
Anion Gap: 16 mmol/L (ref 10.0–18.0)
BUN/Creatinine Ratio: 24 (ref 12–28)
BUN: 22 mg/dL (ref 8–27)
CO2: 23 mmol/L (ref 20–29)
Calcium: 9.4 mg/dL (ref 8.7–10.3)
Chloride: 104 mmol/L (ref 96–106)
Creatinine, Ser: 0.92 mg/dL (ref 0.57–1.00)
GFR calc Af Amer: 76 mL/min/{1.73_m2} (ref >59)
GFR calc non Af Amer: 66 mL/min/{1.73_m2} (ref >59)
Glucose: 161 mg/dL — ABNORMAL HIGH (ref 65–99)
Potassium: 4.6 mmol/L (ref 3.5–5.2)
Sodium: 143 mmol/L (ref 134–144)

## 2019-06-20 NOTE — Progress Notes (Signed)
Internal Medicine Clinic Attending  Case discussed with Dr. Harbrecht at the time of the visit.  We reviewed the resident's history and exam and pertinent patient test results.  I agree with the assessment, diagnosis, and plan of care documented in the resident's note.   

## 2019-06-30 DIAGNOSIS — I509 Heart failure, unspecified: Secondary | ICD-10-CM | POA: Diagnosis not present

## 2019-06-30 DIAGNOSIS — J449 Chronic obstructive pulmonary disease, unspecified: Secondary | ICD-10-CM | POA: Diagnosis not present

## 2019-07-03 ENCOUNTER — Encounter: Payer: Medicare Other | Admitting: Internal Medicine

## 2019-07-04 ENCOUNTER — Other Ambulatory Visit: Payer: Self-pay | Admitting: Internal Medicine

## 2019-07-04 DIAGNOSIS — R21 Rash and other nonspecific skin eruption: Secondary | ICD-10-CM

## 2019-07-11 DIAGNOSIS — E119 Type 2 diabetes mellitus without complications: Secondary | ICD-10-CM | POA: Diagnosis not present

## 2019-07-11 DIAGNOSIS — Z961 Presence of intraocular lens: Secondary | ICD-10-CM | POA: Diagnosis not present

## 2019-07-11 DIAGNOSIS — H2512 Age-related nuclear cataract, left eye: Secondary | ICD-10-CM | POA: Diagnosis not present

## 2019-07-16 ENCOUNTER — Encounter: Payer: Self-pay | Admitting: Internal Medicine

## 2019-07-30 DIAGNOSIS — I509 Heart failure, unspecified: Secondary | ICD-10-CM | POA: Diagnosis not present

## 2019-07-30 DIAGNOSIS — J449 Chronic obstructive pulmonary disease, unspecified: Secondary | ICD-10-CM | POA: Diagnosis not present

## 2019-08-04 ENCOUNTER — Emergency Department (HOSPITAL_COMMUNITY): Payer: Medicare Other

## 2019-08-04 ENCOUNTER — Encounter (HOSPITAL_COMMUNITY): Payer: Self-pay

## 2019-08-04 ENCOUNTER — Emergency Department (HOSPITAL_COMMUNITY)
Admission: EM | Admit: 2019-08-04 | Discharge: 2019-08-04 | Disposition: A | Discharge status: Home / Self Care | Payer: Medicare Other | Attending: Emergency Medicine | Admitting: Emergency Medicine

## 2019-08-04 ENCOUNTER — Other Ambulatory Visit: Payer: Self-pay

## 2019-08-04 DIAGNOSIS — Y9289 Other specified places as the place of occurrence of the external cause: Secondary | ICD-10-CM | POA: Insufficient documentation

## 2019-08-04 DIAGNOSIS — E119 Type 2 diabetes mellitus without complications: Secondary | ICD-10-CM | POA: Insufficient documentation

## 2019-08-04 DIAGNOSIS — Z79899 Other long term (current) drug therapy: Secondary | ICD-10-CM | POA: Insufficient documentation

## 2019-08-04 DIAGNOSIS — J449 Chronic obstructive pulmonary disease, unspecified: Secondary | ICD-10-CM | POA: Insufficient documentation

## 2019-08-04 DIAGNOSIS — Y9301 Activity, walking, marching and hiking: Secondary | ICD-10-CM | POA: Insufficient documentation

## 2019-08-04 DIAGNOSIS — W010XXA Fall on same level from slipping, tripping and stumbling without subsequent striking against object, initial encounter: Secondary | ICD-10-CM | POA: Insufficient documentation

## 2019-08-04 DIAGNOSIS — S99911A Unspecified injury of right ankle, initial encounter: Secondary | ICD-10-CM | POA: Diagnosis present

## 2019-08-04 DIAGNOSIS — S8251XA Displaced fracture of medial malleolus of right tibia, initial encounter for closed fracture: Secondary | ICD-10-CM | POA: Diagnosis not present

## 2019-08-04 DIAGNOSIS — S82831A Other fracture of upper and lower end of right fibula, initial encounter for closed fracture: Secondary | ICD-10-CM | POA: Diagnosis not present

## 2019-08-04 DIAGNOSIS — Z7982 Long term (current) use of aspirin: Secondary | ICD-10-CM | POA: Diagnosis not present

## 2019-08-04 DIAGNOSIS — I110 Hypertensive heart disease with heart failure: Secondary | ICD-10-CM | POA: Diagnosis not present

## 2019-08-04 DIAGNOSIS — S8261XA Displaced fracture of lateral malleolus of right fibula, initial encounter for closed fracture: Secondary | ICD-10-CM | POA: Diagnosis not present

## 2019-08-04 DIAGNOSIS — Z7984 Long term (current) use of oral hypoglycemic drugs: Secondary | ICD-10-CM | POA: Insufficient documentation

## 2019-08-04 DIAGNOSIS — E1165 Type 2 diabetes mellitus with hyperglycemia: Secondary | ICD-10-CM | POA: Diagnosis not present

## 2019-08-04 DIAGNOSIS — R52 Pain, unspecified: Secondary | ICD-10-CM

## 2019-08-04 DIAGNOSIS — S82891A Other fracture of right lower leg, initial encounter for closed fracture: Secondary | ICD-10-CM

## 2019-08-04 DIAGNOSIS — Y999 Unspecified external cause status: Secondary | ICD-10-CM | POA: Insufficient documentation

## 2019-08-04 DIAGNOSIS — R609 Edema, unspecified: Secondary | ICD-10-CM | POA: Diagnosis not present

## 2019-08-04 DIAGNOSIS — W19XXXA Unspecified fall, initial encounter: Secondary | ICD-10-CM

## 2019-08-04 DIAGNOSIS — F17210 Nicotine dependence, cigarettes, uncomplicated: Secondary | ICD-10-CM | POA: Diagnosis not present

## 2019-08-04 DIAGNOSIS — S8251XD Displaced fracture of medial malleolus of right tibia, subsequent encounter for closed fracture with routine healing: Secondary | ICD-10-CM | POA: Diagnosis not present

## 2019-08-04 DIAGNOSIS — I5042 Chronic combined systolic (congestive) and diastolic (congestive) heart failure: Secondary | ICD-10-CM | POA: Insufficient documentation

## 2019-08-04 DIAGNOSIS — Z743 Need for continuous supervision: Secondary | ICD-10-CM | POA: Diagnosis not present

## 2019-08-04 DIAGNOSIS — S8261XD Displaced fracture of lateral malleolus of right fibula, subsequent encounter for closed fracture with routine healing: Secondary | ICD-10-CM | POA: Diagnosis not present

## 2019-08-04 DIAGNOSIS — M25571 Pain in right ankle and joints of right foot: Secondary | ICD-10-CM | POA: Diagnosis not present

## 2019-08-04 DIAGNOSIS — S82841A Displaced bimalleolar fracture of right lower leg, initial encounter for closed fracture: Secondary | ICD-10-CM | POA: Diagnosis not present

## 2019-08-04 MED ORDER — ETOMIDATE 2 MG/ML IV SOLN
10.0000 mg | Freq: Once | INTRAVENOUS | Status: AC
  Administered 2019-08-04: 10 mg via INTRAVENOUS
  Filled 2019-08-04: qty 10

## 2019-08-04 MED ORDER — MIDAZOLAM HCL 2 MG/2ML IJ SOLN
1.0000 mg | Freq: Once | INTRAMUSCULAR | Status: AC
  Administered 2019-08-04: 1 mg via INTRAVENOUS
  Filled 2019-08-04: qty 2

## 2019-08-04 MED ORDER — HYDROCODONE-ACETAMINOPHEN 5-325 MG PO TABS: 1.0000 | ORAL_TABLET | ORAL | 0 refills | Status: DC | PRN
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
## 2019-08-04 NOTE — ED Provider Notes (Signed)
Assumed care at change of shift.  Postreduction film is obtained and improved.  Case discussed with Dr. Erlinda Hong with orthopedics who has reviewed postreduction images and patient may be discharged.  Office to call patient tomorrow to ensure follow-up.  Patient discharged with prescription for Norco, advised to apply ice and elevate leg.   Tacy Learn, PA-C 08/04/19 1753    Isla Pence, MD 08/04/19 [phone]

## 2019-08-04 NOTE — ED Triage Notes (Signed)
Pt was from home. Fall on fri after tripping. Bothering her now. Positive PMS, swollen. Is able to bear some weight on it. Normally use a walker . No medications taken . EMS vitals :  142/70, 70,22,98 RA. 98.1 temp  Acc check 356

## 2019-08-04 NOTE — ED Notes (Signed)
Patient transported to X-ray 

## 2019-08-04 NOTE — Progress Notes (Signed)
Orthopedic Tech Progress Note Patient Details:  [name] [birth year] [id number]  Ortho Devices Type of Ortho Device: Ace wrap, Stirrup splint, Post (short leg) splint Ortho Device/Splint Location: right Ortho Device/Splint Interventions: Application   Post Interventions Patient Tolerated: Well Instructions Provided: Care of device   [place] Pink 08/04/2019, 3:51 PM

## 2019-08-04 NOTE — Discharge Instructions (Addendum)
Follow up with the orthopedic surgeon listed below. Apply ice to the ankle for 30 minutes at a time, three times daily, elevate ankle above the level of your heart.

## 2019-08-04 NOTE — ED Provider Notes (Signed)
Physical Exam  BP (!) 176/75   Pulse 99   Temp 98.1 F (36.7 C) (Oral)   Resp (!) 23   Ht 5\' 2"  (1.575 m)   Wt 91.2 kg   SpO2 100%   BMI 36.76 kg/m   Physical Exam  ED Course/Procedures   Clinical Course as of Aug 03 1808  Nancy Fetter Aug 04, 2019  1445 Spoke to Dr. Erlinda Hong of orthopedics, asks that we obtain a repeat lateral film of the ankle.   [HK]  [place] Spoke to Dr. Erlinda Hong of orthopedics again.  He was able to view the repeat lateral films and recommends reduction in the ED, splinting in a posterior and stirrup splint and obtaining post reduction films after splinting.  Asked that we contact him after this.   [HK]    Clinical Course User Index [HK] Khatri, Hina, PA-C    .Sedation  Date/Time: 08/04/2019 6:09 PM Performed by: Isla Pence, MD Authorized by: Isla Pence, MD   Consent:    Consent obtained:  Written   Consent given by:  Patient Universal protocol:    Immediately prior to procedure a time out was called: yes     Patient identity confirmation method:  Verbally with patient Pre-sedation assessment:    Time since last food or drink:  5   ASA classification: class 2 - patient with mild systemic disease     Mallampati score:  III - soft palate, base of uvula visible   Pre-sedation assessments completed and reviewed: airway patency, cardiovascular function, hydration status, mental status, nausea/vomiting, pain level, respiratory function and temperature   Immediate pre-procedure details:    Reassessment: Patient reassessed immediately prior to procedure     Verified: bag valve mask available, emergency equipment available, intubation equipment available, IV patency confirmed and oxygen available   Procedure details (see MAR for exact dosages):    Preoxygenation:  Room air   Sedation:  Etomidate and midazolam   Intended level of sedation: deep   Intra-procedure monitoring:  Blood pressure monitoring, cardiac monitor, continuous capnometry, continuous pulse oximetry,  frequent LOC assessments and frequent vital sign checks   Intra-procedure events: none     Total Provider sedation time (minutes):  30 Post-procedure details:    Post-sedation assessment completed:  08/04/2019 6:00 PM   Attendance: Constant attendance by certified staff until patient recovered     Recovery: Patient returned to pre-procedure baseline     Post-sedation assessments completed and reviewed: airway patency, cardiovascular function, hydration status, mental status, nausea/vomiting, pain level, respiratory function and temperature     Patient tolerance:  Tolerated well, no immediate complications .Splint Application  Date/Time: 08/04/2019 6:11 PM Performed by: Isla Pence, MD Authorized by: Isla Pence, MD   Consent:    Consent obtained:  Verbal   Consent given by:  Patient   Risks discussed:  Discoloration   Alternatives discussed:  No treatment Pre-procedure details:    Sensation:  Normal Procedure details:    Laterality:  Right   Location:  Ankle   Ankle:  R ankle   Splint type:  Sugar tong   Supplies:  Cotton padding, plaster and elastic bandage Post-procedure details:    Pain:  Improved   Sensation:  Normal   Patient tolerance of procedure:  Tolerated well, no immediate complications .Splint Application  Date/Time: 08/04/2019 6:11 PM Performed by: Isla Pence, MD Authorized by: Isla Pence, MD   Consent:    Consent obtained:  Verbal   Consent given by:  Patient  Risks discussed:  Discoloration   Alternatives discussed:  No treatment Pre-procedure details:    Sensation:  Normal Procedure details:    Laterality:  Right   Location:  Ankle   Splint type:  Short leg   Supplies:  Plaster, cotton padding and elastic bandage Post-procedure details:    Pain:  Improved   Sensation:  Normal   Patient tolerance of procedure:  Tolerated well, no immediate complications Reduction of dislocation  Date/Time: 08/04/2019 6:12 PM Performed by:  Isla Pence, MD Authorized by: Isla Pence, MD  Consent: Verbal consent obtained. Risks and benefits: risks, benefits and alternatives were discussed Consent given by: patient Patient understanding: patient states understanding of the procedure being performed Patient identity confirmed: verbally with patient Time out: Immediately prior to procedure a "time out" was called to verify the correct patient, procedure, equipment, support staff and site/side marked as required. Preparation: Patient was prepped and draped in the usual sterile fashion. Local anesthesia used: no  Anesthesia: Local anesthesia used: no  Sedation: Patient sedated: yes Sedatives: midazolam and etomidate Vitals: Vital signs were monitored during sedation.  Patient tolerance: patient tolerated the procedure well with no immediate complications           Isla Pence, MD 08/04/19 [phone]

## 2019-08-04 NOTE — Progress Notes (Signed)
Orthopedic Tech Progress Note Patient Details:  [name] 04-21-1951 [id number]  Ortho Devices Type of Ortho Device: Crutches Ortho Device/Splint Location: right Ortho Device/Splint Interventions: Application   Post Interventions Patient Tolerated: Ambulated well, Well Instructions Provided: Care of device, Adjustment of device   [name] 08/04/2019, 6:01 PM

## 2019-08-04 NOTE — Progress Notes (Signed)
Reviewed xrays with Hina.  Needs reduction and splint and post reduction xrays prior to discharge from ED.  I have asked a call back once post reduction xrays have been completed so that I can review.    Azucena Cecil, MD [facility] [phone] 3:21 PM

## 2019-08-06 ENCOUNTER — Ambulatory Visit (INDEPENDENT_AMBULATORY_CARE_PROVIDER_SITE_OTHER): Payer: Medicare Other | Admitting: Orthopaedic Surgery

## 2019-08-06 ENCOUNTER — Encounter: Payer: Self-pay | Admitting: Orthopaedic Surgery

## 2019-08-06 ENCOUNTER — Other Ambulatory Visit: Payer: Self-pay

## 2019-08-06 DIAGNOSIS — S82851A Displaced trimalleolar fracture of right lower leg, initial encounter for closed fracture: Secondary | ICD-10-CM | POA: Diagnosis not present

## 2019-08-06 NOTE — Progress Notes (Signed)
Office Visit Note   Patient: [name]           Date of Birth: Nov 05, 1950           MRN: [id number] Visit Date: 08/06/2019              Requested by: Kathi Ludwig, MD [place],  [place] [address] PCP: Kathi Ludwig, MD   Assessment & Plan: Visit Diagnoses:  1. Closed displaced trimalleolar fracture of right ankle, initial encounter     Plan: Impression is displaced right trimalleolar ankle fracture dislocation.  Most recent A1c level is 8.7.  She was instructed on the importance of proper glucose control.  We reapplied the splint today.  I feel that we will need to wait until next Monday for the swelling to come down.  Based on discussion with the patient she has elected to proceed with ORIF of the right ankle after reviewed risks and benefits and rehab.  Follow-Up Instructions: Return for 2 week postop visit.   Orders:  No orders of the defined types were placed in this encounter.  No orders of the defined types were placed in this encounter.     Procedures: No procedures performed   Clinical Data: No additional findings.   Subjective: Chief Complaint  Patient presents with  . Right Ankle - Pain    [name] is a very pleasant 68 year old female comes in for evaluation of her right ankle fracture dislocation that she recently suffered after she fell down some steps at home.  She went to the ER on Saturday for evaluation.  The ankle was dislocated and reduced and splinted in the ER under conscious sedation.  She has been elevating her leg.  She does endorse swelling.  She takes a baby aspirin daily.  Denies any numbness and tingling.   Review of Systems  Constitutional: Negative.   HENT: Negative.   Eyes: Negative.   Respiratory: Negative.   Cardiovascular: Negative.   Endocrine: Negative.   Musculoskeletal: Negative.   Neurological: Negative.   Hematological: Negative.   Psychiatric/Behavioral: Negative.   All other systems reviewed and  are negative.    Objective: Vital Signs: There were no vitals taken for this visit.  Physical Exam Vitals signs and nursing note reviewed.  Constitutional:      Appearance: She is well-developed.  HENT:     Head: Normocephalic and atraumatic.  Neck:     Musculoskeletal: Neck supple.  Pulmonary:     Effort: Pulmonary effort is normal.  Abdominal:     Palpations: Abdomen is soft.  Skin:    General: Skin is warm.     Capillary Refill: Capillary refill takes less than 2 seconds.  Neurological:     Mental Status: She is alert and oriented to person, place, and time.  Psychiatric:        Behavior: Behavior normal.        Thought Content: Thought content normal.        Judgment: Judgment normal.     Ortho Exam Right ankle exam shows moderately severe swelling.  The skin does not wrinkle.  Neurovascularly intact. Specialty Comments:  No specialty comments available.  Imaging: No results found.   PMFS History: Patient Active Problem List   Diagnosis Date Noted  . Trapezius muscle strain 06/19/2019  . Skin rash 07/12/2018  . Need for immunization against influenza 06/13/2018  . 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 Chronic combined systolic and diastolic congestive heart failure ([place])  03/18/2016  . Hypoxia   . Spinal stenosis of lumbar region 12/09/2015  . Restrictive lung disease 06/24/2015  . Diabetic gastroparesis associated with type 2 diabetes mellitus ([place]) 07/12/2014  . Depression 05/22/2014  . DMII (diabetes mellitus, type 2) ([place]) 04/03/2012  . TOBACCO ABUSE 10/26/2009  . HYPERTHYROIDISM, SUBCLINICAL 05/06/2009  . Essential hypertension 05/25/2007   Past Medical History:  Diagnosis Date  . ATN (acute tubular necrosis) ([place]) 07/15/2014  . Automatic implantable cardioverter-defibrillator in situ   . Automatic implantable cardioverter-defibrillator in situ 05/04/2007   Qualifier: Diagnosis of  By: Isla Pence    . Cardiac defibrillator in situ     Atlas II VR (SJM) implanted by Dr Lovena Le  . CHF (congestive heart failure) ([place])   . Chronic combined systolic and diastolic heart failure (HCC)    a. EF 35-40% in past;  b. Echo 7/13:  EF 45-50%, Gr 2 diast dysfn, mild AI, mild MAC, trivial MR, mild LAE, PASP 47.  [name] Chronic ulcer of leg ([place])    04-09-15 resolved-not a problem.  . Colon polyps 04/12/2013   Rectosigmoid polyp  . COPD (chronic obstructive pulmonary disease) (HCC)    O2 at night  . Depression   . Diabetes mellitus   . Diabetes mellitus, type 2 ([place]) 04/03/2012   HIGH RISK FEET.. Please have patient take shoes and socks off every visit for visual foot inspection.    . Eczema   . Elevated alkaline phosphatase level    GGT and 5'nucleotidase 8/13 normal  . Health care maintenance 01/22/2013   Surgically absent cervix- no pap needed (Path report 07/2000: uterine body with attached bilateral adnexa and separate cervix.)  . History of oxygen administration    oxygen @ 2 l/m nasally bedtime 24/7  . HTN (hypertension)   . Hx of cardiac cath    a. [place] 2003 normal;  b. [facility] 6/13:  Mild calcification in the LM, o/w normal coronary arteries, EF 45%.   . Hyperkalemia 08/08/2017  . Hyperlipidemia   . Hyperlipidemia associated with type 2 diabetes mellitus ([place]) 05/25/2007   Qualifier: Diagnosis of  By: Hassell Done FNP, Tori Milks    . Hyperthyroidism, subclinical   . Implantable cardioverter-defibrillator (ICD) generator end of life   . Lipoma   . Need for hepatitis C screening test 04/25/2019  . NICM (nonischemic cardiomyopathy) ([place])   . Obesity   . On home oxygen therapy    "2L; 24/7" (10/11/2016)  . Post menopausal syndrome   . Shortness of breath 07/14/2017  . Skin rash 07/12/2018  . Sleep apnea    pt denies 04/12/2013    Family History  Problem Relation Age of Onset  . Stroke Mother   . Seizures Father   . Heart disease Father   . Diabetes Sister   . Asthma Maternal Aunt        aunts  . Asthma Maternal Uncle        uncles   . Heart disease Paternal Aunt        aunts  . Heart disease Paternal Uncle        uncles  . Heart disease Maternal Aunt        aunts  . Heart disease Maternal Uncle        uncles  . Heart disease Maternal Grandfather   . Colon cancer Neg Hx   . Colon polyps Neg Hx   . Esophageal cancer Neg Hx   . Kidney disease Neg Hx   . Gallbladder disease  Neg Hx     Past Surgical History:  Procedure Laterality Date  . ABDOMINAL HYSTERECTOMY    . CARDIAC CATHETERIZATION    . CARDIAC CATHETERIZATION N/A 06/21/2016   Procedure: Left Heart Cath and Coronary Angiography;  Surgeon: Sherren Mocha, MD;  Location: [facility];  Service: Cardiovascular;  Laterality: N/A;  . CARDIAC DEFIBRILLATOR PLACEMENT  05/04/2007   SJM Atlas II VR ICD  . CARDIAC DEFIBRILLATOR PLACEMENT    . CATARACT EXTRACTION     od  . COLONOSCOPY N/A 04/12/2013   Procedure: COLONOSCOPY;  Surgeon: Beryle Beams, MD;  Location: [facility];  Service: Endoscopy;  Laterality: N/A;  pt.has defibrilator  . COLONOSCOPY N/A 04/16/2015   Procedure: COLONOSCOPY;  Surgeon: [name], MD;  Location: [facility];  Service: Endoscopy;  Laterality: N/A;  . ESOPHAGOGASTRODUODENOSCOPY N/A 04/16/2015   Procedure: ESOPHAGOGASTRODUODENOSCOPY (EGD);  Surgeon: [name], MD;  Location: [facility];  Service: Endoscopy;  Laterality: N/A;  . HERNIA REPAIR    . HYSTEROSCOPY    . IMPLANTABLE CARDIOVERTER DEFIBRILLATOR (ICD) GENERATOR CHANGE N/A 04/02/2014   Procedure: ICD GENERATOR CHANGE;  Surgeon: Evans Lance, MD;  Location: [facility] C[facility] LAB;  Service: Cardiovascular;  Laterality: N/A;  . INSERT / REPLACE / REMOVE PACEMAKER    . LEFT HEART CATHETERIZATION WITH CORONARY ANGIOGRAM N/A 04/02/2012   Procedure: LEFT HEART CATHETERIZATION WITH CORONARY ANGIOGRAM;  Surgeon: Hillary Bow, MD;  Location: [facility] CATH LAB;  Service: Cardiovascular;  Laterality: N/A;  . TUBAL LIGATION     Social History   Occupational History  . Occupation:  disabled  Tobacco Use  . Smoking status: Current Every Day Smoker    Packs/day: 0.10    Years: 39.00    Pack years: 3.90    Types: Cigarettes  . Smokeless tobacco: Never Used  . Tobacco comment: 2-3 cig/day.  Substance and Sexual Activity  . Alcohol use: No    Alcohol/week: 1.0 standard drinks    Types: 1 Glasses of wine per week  . Drug use: No  . Sexual activity: Not Currently

## 2019-08-06 NOTE — Progress Notes (Signed)
Patient's chart reviewed,she has Hx NICM-has pacemaker/ICD, last ECHO 07-15-17 showed EF 25-30%. Pt also has restrictive lung dz and is on O2 at 2L/[place] at home. Due to multiple co-morbidities and DSC guidelines she will need to be done at [facility]. Sherrie at Dr Phoebe Sharps office notified.

## 2019-08-07 ENCOUNTER — Other Ambulatory Visit: Payer: Self-pay

## 2019-08-07 NOTE — Pre-Procedure Instructions (Signed)
[facility] [place], [address] [place] [address] Phone: [phone] Fax: [phone]      Your procedure is scheduled on Monday November 9th.  Report to [facility] Main Entrance "A" at 11:00 A.M., and check in at the Admitting office.  Call this number if you have problems the morning of surgery:  [phone]  Call [phone] if you have any questions prior to your surgery date Monday-Friday 8am-4pm    Remember:  Do not eat after midnight the night before your surgery  You may drink clear liquids until 10:00 the morning of your surgery.   Clear liquids allowed are: Water, Non-Citrus Juices (without pulp), Carbonated Beverages, Clear Tea, Black Coffee Only, and Gatorade  Your surgeon has prescribed a pre-surgery Gatorade G2 for you to drink the morning of surgery. Please finish by 10:00 am. Patient Instructions  . The night before surgery:  o No food after midnight. ONLY clear liquids after midnight  . The day of surgery (if you have diabetes): o Drink ONE (1) Gatorade 2 (G2) as directed. o This drink was given to you during your hospital  pre-op appointment visit.  o The pre-op nurse will instruct you on the time to drink the   Gatorade 2 (G2) depending on your surgery time. o Color of the Gatorade may vary. Red is not allowed. o Nothing else to drink after completing the  Gatorade 2 (G2).         If you have questions, please contact your surgeon's office.   Take these medicines the morning of surgery with A SIP OF WATER  ANORO ELLIPTA  metoprolol succinate (TOPROL-XL) HYDROcodone-acetaminophen (NORCO/VICODIN) if needed albuterol (PROVENTIL) if needed  Follow your surgeon's instructions on when to stop Asprin.  If no instructions were given by your surgeon then you will need to call the office to get those instructions.     As of today, STOP taking any Aspirin (unless  otherwise instructed by your surgeon), Aleve, Naproxen, Ibuprofen, Motrin, Advil, Goody's, BC's, all herbal medications, fish oil, and all vitamins.   HOW TO MANAGE YOUR DIABETES BEFORE AND AFTER SURGERY  Why is it important to control my blood sugar before and after surgery? . Improving blood sugar levels before and after surgery helps healing and can limit problems. . A way of improving blood sugar control is eating a healthy diet by: o  Eating less sugar and carbohydrates o  Increasing activity/exercise o  Talking with your doctor about reaching your blood sugar goals . High blood sugars (greater than 180 mg/dL) can raise your risk of infections and slow your recovery, so you will need to focus on controlling your diabetes during the weeks before surgery. . Make sure that the doctor who takes care of your diabetes knows about your planned surgery including the date and location.  How do I manage my blood sugar before surgery? . Check your blood sugar at least 4 times a day, starting 2 days before surgery, to make sure that the level is not too high or low. o Check your blood sugar the morning of your surgery when you wake up and every 2 hours until you get to the Short Stay unit. . If your blood sugar is less than 70 mg/dL, you will need to treat for low blood sugar: o Do not take insulin. o Treat a low blood sugar (less than 70  mg/dL) with  cup of clear juice (cranberry or apple), 4 glucose tablets, OR glucose gel. Recheck blood sugar in 15 minutes after treatment (to make sure it is greater than 70 mg/dL). If your blood sugar is not greater than 70 mg/dL on recheck, call [phone] o  for further instructions. . Report your blood sugar to the short stay nurse when you get to Short Stay.  . If you are admitted to the hospital after surgery: o Your blood sugar will be checked by the staff and you will probably be given insulin after surgery (instead of oral diabetes medicines) to make  sure you have good blood sugar levels. o The goal for blood sugar control after surgery is 80-180 mg/dL.     WHAT DO I DO ABOUT MY DIABETES MEDICATION?   [name] The day of surgery, do not take other diabetes injectables, including Byetta (exenatide), Bydureon (exenatide ER), Victoza (liraglutide), or Trulicity (dulaglutide).    The Morning of Surgery  Do not wear jewelry, make-up or nail polish.  Do not wear lotions, powders, or perfumes/colognes, or deodorant  Do not shave 48 hours prior to surgery.  Men may shave face and neck.  Do not bring valuables to the hospital.  [facility] is not responsible for any belongings or valuables.  If you are a smoker, DO NOT Smoke 24 hours prior to surgery IF you wear a CPAP at night please bring your mask, tubing, and machine the morning of surgery   Remember that you must have someone to transport you home after your surgery, and remain with you for 24 hours if you are discharged the same day.   Contacts, glasses, hearing aids, dentures or bridgework may not be worn into surgery.    Leave your suitcase in the car.  After surgery it may be brought to your room.  For patients admitted to the hospital, discharge time will be determined by your treatment team.  Patients discharged the day of surgery will not be allowed to drive home.    Special instructions:   [facility]- Preparing For Surgery  Before surgery, you can play an important role. Because skin is not sterile, your skin needs to be as free of germs as possible. You can reduce the number of germs on your skin by washing with CHG (chlorahexidine gluconate) Soap before surgery.  CHG is an antiseptic cleaner which kills germs and bonds with the skin to continue killing germs even after washing.    Oral Hygiene is also important to reduce your risk of infection.  Remember - BRUSH YOUR TEETH THE MORNING OF SURGERY WITH YOUR REGULAR TOOTHPASTE  Please do not use if you have an allergy to CHG  or antibacterial soaps. If your skin becomes reddened/irritated stop using the CHG.  Do not shave (including legs and underarms) for at least 48 hours prior to first CHG shower. It is OK to shave your face.  Please follow these instructions carefully.   1. Shower the NIGHT BEFORE SURGERY and the MORNING OF SURGERY with CHG Soap.   2. If you chose to wash your hair, wash your hair first as usual with your normal shampoo.  3. After you shampoo, rinse your hair and body thoroughly to remove the shampoo.  4. Use CHG as you would any other liquid soap. You can apply CHG directly to the skin and wash gently with a scrungie or a clean washcloth.   5. Apply the CHG Soap to your body ONLY FROM  THE NECK DOWN.  Do not use on open wounds or open sores. Avoid contact with your eyes, ears, mouth and genitals (private parts). Wash Face and genitals (private parts)  with your normal soap.   6. Wash thoroughly, paying special attention to the area where your surgery will be performed.  7. Thoroughly rinse your body with warm water from the neck down.  8. DO NOT shower/wash with your normal soap after using and rinsing off the CHG Soap.  9. Pat yourself dry with a CLEAN TOWEL.  10. Wear CLEAN PAJAMAS to bed the night before surgery, wear comfortable clothes the morning of surgery  11. Place CLEAN SHEETS on your bed the night of your first shower and DO NOT SLEEP WITH PETS.    Day of Surgery:  Do not apply any deodorants/lotions. Please shower the morning of surgery with the CHG soap  Please wear clean clothes to the hospital/surgery center.   Remember to brush your teeth WITH YOUR REGULAR TOOTHPASTE.   Please read over the following fact sheets that you were given.

## 2019-08-08 ENCOUNTER — Encounter (HOSPITAL_COMMUNITY)
Admission: RE | Admit: 2019-08-08 | Discharge: 2019-08-08 | Disposition: A | Discharge status: Home / Self Care | Payer: Medicare Other | Source: Ambulatory Visit | Attending: Orthopaedic Surgery | Admitting: Orthopaedic Surgery

## 2019-08-08 ENCOUNTER — Other Ambulatory Visit: Payer: Self-pay

## 2019-08-08 ENCOUNTER — Other Ambulatory Visit (HOSPITAL_COMMUNITY)
Admission: RE | Admit: 2019-08-08 | Discharge: 2019-08-08 | Disposition: A | Discharge status: Home / Self Care | Payer: Medicare Other | Source: Ambulatory Visit | Attending: Orthopaedic Surgery | Admitting: Orthopaedic Surgery

## 2019-08-08 ENCOUNTER — Ambulatory Visit: Payer: Medicare Other | Admitting: Orthopaedic Surgery

## 2019-08-08 ENCOUNTER — Encounter (HOSPITAL_COMMUNITY): Payer: Self-pay

## 2019-08-08 DIAGNOSIS — Z20828 Contact with and (suspected) exposure to other viral communicable diseases: Secondary | ICD-10-CM | POA: Diagnosis not present

## 2019-08-08 DIAGNOSIS — I428 Other cardiomyopathies: Secondary | ICD-10-CM | POA: Diagnosis not present

## 2019-08-08 DIAGNOSIS — I509 Heart failure, unspecified: Secondary | ICD-10-CM | POA: Diagnosis not present

## 2019-08-08 DIAGNOSIS — Z95810 Presence of automatic (implantable) cardiac defibrillator: Secondary | ICD-10-CM | POA: Diagnosis not present

## 2019-08-08 DIAGNOSIS — Z01818 Encounter for other preprocedural examination: Secondary | ICD-10-CM | POA: Insufficient documentation

## 2019-08-08 DIAGNOSIS — E109 Type 1 diabetes mellitus without complications: Secondary | ICD-10-CM | POA: Insufficient documentation

## 2019-08-08 LAB — CBC
HCT: 39.7 % (ref 36.0–46.0)
Hemoglobin: 12.3 g/dL (ref 12.0–15.0)
MCH: 27.5 pg (ref 26.0–34.0)
MCHC: 31 g/dL (ref 30.0–36.0)
MCV: 88.6 fL (ref 80.0–100.0)
Platelets: 251 10*3/uL (ref 150–400)
RBC: 4.48 MIL/uL (ref 3.87–5.11)
RDW: 14.7 % (ref 11.5–15.5)
WBC: 9 10*3/uL (ref 4.0–10.5)
nRBC: 0 % (ref 0.0–0.2)

## 2019-08-08 LAB — BASIC METABOLIC PANEL
Anion gap: 12 (ref 5–15)
BUN: 22 mg/dL (ref 8–23)
CO2: 23 mmol/L (ref 22–32)
Calcium: 9.6 mg/dL (ref 8.9–10.3)
Chloride: 104 mmol/L (ref 98–111)
Creatinine, Ser: 1.22 mg/dL — ABNORMAL HIGH (ref 0.44–1.00)
GFR calc Af Amer: 54 mL/min — ABNORMAL LOW (ref >60)
GFR calc non Af Amer: 46 mL/min — ABNORMAL LOW (ref >60)
Glucose, Bld: 226 mg/dL — ABNORMAL HIGH (ref 70–99)
Potassium: 4.5 mmol/L (ref 3.5–5.1)
Sodium: 139 mmol/L (ref 135–145)

## 2019-08-08 LAB — HEMOGLOBIN A1C
Hgb A1c MFr Bld: 9.1 % — ABNORMAL HIGH (ref 4.8–5.6)
Mean Plasma Glucose: 214.47 mg/dL

## 2019-08-08 LAB — SURGICAL PCR SCREEN
MRSA, PCR: NEGATIVE
Staphylococcus aureus: NEGATIVE

## 2019-08-08 LAB — GLUCOSE, CAPILLARY: Glucose-Capillary: 232 mg/dL — ABNORMAL HIGH (ref 70–99)

## 2019-08-08 NOTE — Progress Notes (Signed)
PCP - Dr. Berline Lopes Cardiologist - Dr. Lubertha South. Lovena Le  PPM/ICD - Pacemaker-St[employer] Device Orders - not pacemaker dependent; "procedure should not interfere with device function; no device reprogramming or magnet placement needed."   Chest x-ray - N/A EKG - 11/13/18 Stress Test - 04/01/16 ECHO - 07/15/17 Cardiac Cath - 06/21/16  Sleep Study - 2017 CPAP - no CPAP, uses 2L oxygen [place] at night  Fasting Blood Sugar - 120's Checks Blood Sugar 3 times a day   Aspirin Instructions: Patient instructed to hold all Aspirin, NSAID's, herbal medications, fish oil and vitamins 7 days prior to surgery.   ERAS Protcol - clear liquids until 10am DOS PRE-SURGERY Ensure or G2- G2 given to patient  COVID TEST- 08/08/19 at [facility]. Pt instructed to inform security they are having surgery and need to be in the surgical line. Aware if they arrive at large "white tent", to redirect to the surgical line.    Anesthesia review: cardiac history  Patient denies shortness of breath, fever, cough and chest pain at PAT appointment   All instructions explained to the patient, with a verbal understanding of the material. Patient agrees to go over the instructions while at home for a better understanding. Patient also instructed to self quarantine after being tested for COVID-19. The opportunity to ask questions was provided.

## 2019-08-09 LAB — NOVEL CORONAVIRUS, NAA (HOSP ORDER, SEND-OUT TO REF LAB; TAT 18-24 HRS): SARS-CoV-2, NAA: NOT DETECTED

## 2019-08-09 NOTE — Progress Notes (Addendum)
Anesthesia Chart Review: Follows with Dr. Lovena Le for hx of CHF (EF 25-30% by echo 2018), NICM, s/p ICD insertion. Last seen by Dr. Lovena Le 11/27/18, device functioning normally at that time, no changes to management.   Device checked in clinic 05/30/19, per note "ICD check in clinic. Normal device function. Threshold and sensing consistent with previous device measurements. Impedance trends stable over time. 2 NSVT episodes--available EGM shows VT hovering at detection (CLs 281-353ms), episode self-terminated, patient asymptomatic. Histogram distribution appropriate for patient and level of activity. CorVue currently at baseline. Device programmed at appropriate safety margins; per GT, lowered VT detection rate to 190bpm (39ms). Device programmed to optimize intrinsic conduction. Estimated longevity 5.0 years."  Reports using 2L O2 QHS.  Device orders on pt chart.  Pre labs reviewed, poorly controlled DMI with A1c 9.1., otherwise unremarkable  EKG 11/13/18: Sinus tachycardia. Rate 111. Probable left atrial enlargement. Borderline T abnormalities, diffuse leads. Prolonged QT interval. No significant change since last tracing other than rate is faster.  TTE 07/15/17: - Left ventricle: The cavity size was mildly dilated. There was   mild concentric hypertrophy. Systolic function was severely   reduced. The estimated ejection fraction was in the range of 25%   to 30%. Moderate diffuse hypokinesis. There was an increased   relative contribution of atrial contraction to ventricular   filling. Doppler parameters are consistent with abnormal left   ventricular relaxation (grade 1 diastolic dysfunction). Doppler   parameters are consistent with high ventricular filling pressure. - Aortic valve: There was mild regurgitation. - Mitral valve: Calcified annulus. There was mild regurgitation. - Pulmonary arteries: Systolic pressure could not be accurately   Estimated.  Cath 06/21/16: 1. Angiographically  normal coronary arteries 2. Elevated LVEDP (22 mmHg)  Recommendations: medical Rx for CHF   [name] [facility] Short Stay Center/Anesthesiology Phone [phone] 08/09/2019 11:07 AM

## 2019-08-09 NOTE — Anesthesia Preprocedure Evaluation (Addendum)
Anesthesia Evaluation  Patient identified by MRN, date of birth, ID band Patient awake    Reviewed: Allergy & Precautions, NPO status , Patient's Chart, lab work & pertinent test results  Airway Mallampati: III  TM Distance: >3 FB Neck ROM: Full    Dental  (+) Edentulous Upper, Edentulous Lower   Pulmonary sleep apnea , COPD,  COPD inhaler and oxygen dependent, Current Smoker and Patient abstained from smoking.,    Pulmonary exam normal breath sounds clear to auscultation       Cardiovascular hypertension, Pt. on medications and Pt. on home beta blockers +CHF  Normal cardiovascular exam+ Cardiac Defibrillator  Rhythm:Regular Rate:Normal     Neuro/Psych PSYCHIATRIC DISORDERS Depression negative neurological ROS     GI/Hepatic negative GI ROS, Neg liver ROS,   Endo/Other  diabetes, Oral Hypoglycemic Agents  Renal/GU negative Renal ROS     Musculoskeletal negative musculoskeletal ROS (+)   Abdominal (+) + obese,   Peds  Hematology HLD   Anesthesia Other Findings right trimalleolar ankle fracture  Reproductive/Obstetrics                           Anesthesia Physical Anesthesia Plan  ASA: IV  Anesthesia Plan: General and Regional   Post-op Pain Management: GA combined w/ Regional for post-op pain   Induction: Intravenous  PONV Risk Score and Plan: 2 and Ondansetron, Dexamethasone, Midazolam and Treatment may vary due to age or medical condition  Airway Management Planned: LMA  Additional Equipment:   Intra-op Plan:   Post-operative Plan: Extubation in OR  Informed Consent: I have reviewed the patients History and Physical, chart, labs and discussed the procedure including the risks, benefits and alternatives for the proposed anesthesia with the patient or authorized representative who has indicated his/her understanding and acceptance.       Plan Discussed with:  CRNA  Anesthesia Plan Comments: (Per PA-C: Follows with Dr. Lovena Le for hx of CHF (EF 25-30% by echo 2018), NICM, s/p ICD insertion. Last seen by Dr. Lovena Le 11/27/18, device functioning normally at that time, no changes to management.   Device checked in clinic 05/30/19, per note "ICD check in clinic. Normal device function. Threshold and sensing consistent with previous device measurements. Impedance trends stable over time. 2 NSVT episodes--available EGM shows VT hovering at detection (CLs 281-391ms), episode self-terminated, patient asymptomatic. Histogram distribution appropriate for patient and level of activity. CorVue currently at baseline. Device programmed at appropriate safety margins; per GT, lowered VT detection rate to 190bpm (348ms). Device programmed to optimize intrinsic conduction. Estimated longevity 5.0 years."  Reports using 2L O2 QHS.  Device orders on pt chart.  Pre labs reviewed, poorly controlled DMI with A1c 9.1., otherwise unremarkable  EKG 11/13/18: Sinus tachycardia. Rate 111. Probable left atrial enlargement. Borderline T abnormalities, diffuse leads. Prolonged QT interval. No significant change since last tracing other than rate is faster.  TTE 07/15/17: - Left ventricle: The cavity size was mildly dilated. There was   mild concentric hypertrophy. Systolic function was severely   reduced. The estimated ejection fraction was in the range of 25%   to 30%. Moderate diffuse hypokinesis. There was an increased   relative contribution of atrial contraction to ventricular   filling. Doppler parameters are consistent with abnormal left   ventricular relaxation (grade 1 diastolic dysfunction). Doppler   parameters are consistent with high ventricular filling pressure. - Aortic valve: There was mild regurgitation. - Mitral valve: Calcified annulus. There was mild  regurgitation. - Pulmonary arteries: Systolic pressure could not be accurately   Estimated.  Cath 06/21/16: 1.  Angiographically normal coronary arteries 2. Elevated LVEDP (22 mmHg)  Recommendations: medical Rx for CHF )     Anesthesia Quick Evaluation

## 2019-08-12 ENCOUNTER — Other Ambulatory Visit: Payer: Self-pay

## 2019-08-12 ENCOUNTER — Ambulatory Visit (HOSPITAL_COMMUNITY): Payer: Medicare Other | Admitting: Physician Assistant

## 2019-08-12 ENCOUNTER — Ambulatory Visit (HOSPITAL_COMMUNITY)
Admission: RE | Admit: 2019-08-12 | Discharge: 2019-08-12 | Disposition: A | Discharge status: Home / Self Care | Payer: Medicare Other | Attending: Orthopaedic Surgery | Admitting: Orthopaedic Surgery

## 2019-08-12 ENCOUNTER — Ambulatory Visit (HOSPITAL_COMMUNITY): Payer: Medicare Other

## 2019-08-12 ENCOUNTER — Encounter (HOSPITAL_COMMUNITY)
Admission: RE | Disposition: A | Discharge status: Home / Self Care | Payer: Self-pay | Source: Home / Self Care | Attending: Orthopaedic Surgery

## 2019-08-12 ENCOUNTER — Encounter (HOSPITAL_COMMUNITY): Payer: Self-pay | Admitting: Anesthesiology

## 2019-08-12 ENCOUNTER — Ambulatory Visit (HOSPITAL_COMMUNITY): Payer: Medicare Other | Admitting: Anesthesiology

## 2019-08-12 DIAGNOSIS — Z7982 Long term (current) use of aspirin: Secondary | ICD-10-CM | POA: Insufficient documentation

## 2019-08-12 DIAGNOSIS — I110 Hypertensive heart disease with heart failure: Secondary | ICD-10-CM | POA: Insufficient documentation

## 2019-08-12 DIAGNOSIS — G4730 Sleep apnea, unspecified: Secondary | ICD-10-CM | POA: Insufficient documentation

## 2019-08-12 DIAGNOSIS — Z6836 Body mass index (BMI) 36.0-36.9, adult: Secondary | ICD-10-CM | POA: Insufficient documentation

## 2019-08-12 DIAGNOSIS — E669 Obesity, unspecified: Secondary | ICD-10-CM | POA: Diagnosis not present

## 2019-08-12 DIAGNOSIS — Z95810 Presence of automatic (implantable) cardiac defibrillator: Secondary | ICD-10-CM | POA: Diagnosis not present

## 2019-08-12 DIAGNOSIS — Z7984 Long term (current) use of oral hypoglycemic drugs: Secondary | ICD-10-CM | POA: Insufficient documentation

## 2019-08-12 DIAGNOSIS — F17210 Nicotine dependence, cigarettes, uncomplicated: Secondary | ICD-10-CM | POA: Diagnosis not present

## 2019-08-12 DIAGNOSIS — E785 Hyperlipidemia, unspecified: Secondary | ICD-10-CM | POA: Insufficient documentation

## 2019-08-12 DIAGNOSIS — Z79899 Other long term (current) drug therapy: Secondary | ICD-10-CM | POA: Diagnosis not present

## 2019-08-12 DIAGNOSIS — I428 Other cardiomyopathies: Secondary | ICD-10-CM | POA: Diagnosis not present

## 2019-08-12 DIAGNOSIS — E119 Type 2 diabetes mellitus without complications: Secondary | ICD-10-CM | POA: Diagnosis not present

## 2019-08-12 DIAGNOSIS — X58XXXA Exposure to other specified factors, initial encounter: Secondary | ICD-10-CM | POA: Diagnosis not present

## 2019-08-12 DIAGNOSIS — J449 Chronic obstructive pulmonary disease, unspecified: Secondary | ICD-10-CM | POA: Diagnosis not present

## 2019-08-12 DIAGNOSIS — Z9981 Dependence on supplemental oxygen: Secondary | ICD-10-CM | POA: Insufficient documentation

## 2019-08-12 DIAGNOSIS — I5042 Chronic combined systolic (congestive) and diastolic (congestive) heart failure: Secondary | ICD-10-CM | POA: Diagnosis not present

## 2019-08-12 DIAGNOSIS — S82841A Displaced bimalleolar fracture of right lower leg, initial encounter for closed fracture: Secondary | ICD-10-CM

## 2019-08-12 DIAGNOSIS — Z419 Encounter for procedure for purposes other than remedying health state, unspecified: Secondary | ICD-10-CM

## 2019-08-12 DIAGNOSIS — G8918 Other acute postprocedural pain: Secondary | ICD-10-CM | POA: Diagnosis not present

## 2019-08-12 DIAGNOSIS — S82831D Other fracture of upper and lower end of right fibula, subsequent encounter for closed fracture with routine healing: Secondary | ICD-10-CM | POA: Diagnosis not present

## 2019-08-12 HISTORY — PX: ORIF ANKLE FRACTURE: SHX5408

## 2019-08-12 LAB — GLUCOSE, CAPILLARY
Glucose-Capillary: 194 mg/dL — ABNORMAL HIGH (ref 70–99)
Glucose-Capillary: 146 mg/dL — ABNORMAL HIGH (ref 70–99)
Glucose-Capillary: 157 mg/dL — ABNORMAL HIGH (ref 70–99)

## 2019-08-12 SURGERY — OPEN REDUCTION INTERNAL FIXATION (ORIF) ANKLE FRACTURE
Anesthesia: Regional | Site: Ankle | Laterality: Right

## 2019-08-12 MED ORDER — LACTATED RINGERS IV SOLN: INTRAVENOUS | Status: DC

## 2019-08-12 MED ORDER — ONDANSETRON HCL 4 MG/2ML IJ SOLN
INTRAMUSCULAR | Status: AC
  Filled 2019-08-12: qty 2

## 2019-08-12 MED ORDER — PHENYLEPHRINE 40 MCG/ML (10ML) SYRINGE FOR IV PUSH (FOR BLOOD PRESSURE SUPPORT)
PREFILLED_SYRINGE | INTRAVENOUS | Status: DC | PRN
  Administered 2019-08-12 (×4): 80 ug via INTRAVENOUS

## 2019-08-12 MED ORDER — MIDAZOLAM HCL 2 MG/2ML IJ SOLN
INTRAMUSCULAR | Status: AC
  Administered 2019-08-12: 11:00:00 1 mg via INTRAVENOUS
  Filled 2019-08-12: qty 2

## 2019-08-12 MED ORDER — LACTATED RINGERS IV SOLN
INTRAVENOUS | Status: DC
  Administered 2019-08-12: 10:00:00 via INTRAVENOUS

## 2019-08-12 MED ORDER — DEXAMETHASONE SODIUM PHOSPHATE 10 MG/ML IJ SOLN
INTRAMUSCULAR | Status: AC
  Filled 2019-08-12: qty 1

## 2019-08-12 MED ORDER — CHLORHEXIDINE GLUCONATE 4 % EX LIQD: 60.0000 mL | Freq: Once | CUTANEOUS | Status: DC

## 2019-08-12 MED ORDER — CEFAZOLIN SODIUM-DEXTROSE 2-4 GM/100ML-% IV SOLN
2.0000 g | INTRAVENOUS | Status: AC
  Administered 2019-08-12: 2 g via INTRAVENOUS

## 2019-08-12 MED ORDER — ONDANSETRON HCL 4 MG/2ML IJ SOLN
INTRAMUSCULAR | Status: DC | PRN
  Administered 2019-08-12: 4 mg via INTRAVENOUS

## 2019-08-12 MED ORDER — OXYCODONE HCL ER 10 MG PO T12A: 10.0000 mg | EXTENDED_RELEASE_TABLET | Freq: Two times a day (BID) | ORAL | 0 refills | Status: AC

## 2019-08-12 MED ORDER — METOPROLOL SUCCINATE ER 25 MG PO TB24
ORAL_TABLET | ORAL | Status: AC
  Filled 2019-08-12: qty 2

## 2019-08-12 MED ORDER — ASPIRIN EC 81 MG PO TBEC: 81.0000 mg | DELAYED_RELEASE_TABLET | Freq: Two times a day (BID) | ORAL | 0 refills | Status: DC

## 2019-08-12 MED ORDER — PHENYLEPHRINE 40 MCG/ML (10ML) SYRINGE FOR IV PUSH (FOR BLOOD PRESSURE SUPPORT)
PREFILLED_SYRINGE | INTRAVENOUS | Status: AC
  Filled 2019-08-12: qty 10

## 2019-08-12 MED ORDER — FENTANYL CITRATE (PF) 100 MCG/2ML IJ SOLN
INTRAMUSCULAR | Status: AC
  Filled 2019-08-12: qty 2

## 2019-08-12 MED ORDER — FENTANYL CITRATE (PF) 250 MCG/5ML IJ SOLN
INTRAMUSCULAR | Status: AC
  Filled 2019-08-12: qty 5

## 2019-08-12 MED ORDER — MIDAZOLAM HCL 2 MG/2ML IJ SOLN
1.0000 mg | Freq: Once | INTRAMUSCULAR | Status: AC
  Administered 2019-08-12: 11:00:00 1 mg via INTRAVENOUS

## 2019-08-12 MED ORDER — ONDANSETRON HCL 4 MG PO TABS: 4.0000 mg | ORAL_TABLET | Freq: Three times a day (TID) | ORAL | 0 refills | Status: DC | PRN

## 2019-08-12 MED ORDER — LIDOCAINE HCL (CARDIAC) PF 100 MG/5ML IV SOSY
PREFILLED_SYRINGE | INTRAVENOUS | Status: DC | PRN
  Administered 2019-08-12: 40 mg via INTRAVENOUS

## 2019-08-12 MED ORDER — ROPIVACAINE HCL 5 MG/ML IJ SOLN
INTRAMUSCULAR | Status: DC | PRN
  Administered 2019-08-12: 20 mL via PERINEURAL
  Administered 2019-08-12: 30 mL via PERINEURAL

## 2019-08-12 MED ORDER — FENTANYL CITRATE (PF) 100 MCG/2ML IJ SOLN
INTRAMUSCULAR | Status: DC | PRN
  Administered 2019-08-12 (×2): 50 ug via INTRAVENOUS

## 2019-08-12 MED ORDER — CALCIUM CARBONATE-VITAMIN D 500-200 MG-UNIT PO TABS: 1.0000 | ORAL_TABLET | Freq: Three times a day (TID) | ORAL | 6 refills | Status: DC

## 2019-08-12 MED ORDER — DEXAMETHASONE SODIUM PHOSPHATE 4 MG/ML IJ SOLN
INTRAMUSCULAR | Status: DC | PRN
  Administered 2019-08-12: 4 mg via INTRAVENOUS

## 2019-08-12 MED ORDER — ACETAMINOPHEN 500 MG PO TABS
ORAL_TABLET | ORAL | Status: AC
  Filled 2019-08-12: qty 2

## 2019-08-12 MED ORDER — METOPROLOL SUCCINATE ER 25 MG PO TB24
50.0000 mg | ORAL_TABLET | Freq: Once | ORAL | Status: AC
  Administered 2019-08-12: 10:00:00 50 mg via ORAL

## 2019-08-12 MED ORDER — OXYCODONE-ACETAMINOPHEN 5-325 MG PO TABS: 1.0000 | ORAL_TABLET | Freq: Three times a day (TID) | ORAL | 0 refills | Status: DC | PRN

## 2019-08-12 MED ORDER — 0.9 % SODIUM CHLORIDE (POUR BTL) OPTIME
TOPICAL | Status: DC | PRN
  Administered 2019-08-12 (×4): 1000 mL

## 2019-08-12 MED ORDER — CEFAZOLIN SODIUM-DEXTROSE 2-4 GM/100ML-% IV SOLN
INTRAVENOUS | Status: AC
  Filled 2019-08-12: qty 100

## 2019-08-12 MED ORDER — ONDANSETRON HCL 4 MG/2ML IJ SOLN: 4.0000 mg | Freq: Once | INTRAMUSCULAR | Status: DC | PRN

## 2019-08-12 MED ORDER — PROPOFOL 10 MG/ML IV BOLUS
INTRAVENOUS | Status: DC | PRN
  Administered 2019-08-12: 100 mg via INTRAVENOUS

## 2019-08-12 MED ORDER — FENTANYL CITRATE (PF) 100 MCG/2ML IJ SOLN: 25.0000 ug | INTRAMUSCULAR | Status: DC | PRN

## 2019-08-12 SURGICAL SUPPLY — 70 items
BANDAGE ESMARK 6X9 LF (GAUZE/BANDAGES/DRESSINGS) IMPLANT
BIT DRILL QC 2.0 SHORT EVOS SM (DRILL) IMPLANT
BIT DRILL QC 2.5MM SHRT EVO SM (DRILL) ×1 IMPLANT
BLADE SURG 15 STRL LF DISP TIS (BLADE) ×1 IMPLANT
BLADE SURG 15 STRL SS (BLADE) ×2
BNDG CMPR 9X6 STRL LF SNTH (GAUZE/BANDAGES/DRESSINGS)
BNDG COHESIVE 4X5 TAN STRL (GAUZE/BANDAGES/DRESSINGS) ×2 IMPLANT
BNDG ELASTIC 4X5.8 VLCR STR LF (GAUZE/BANDAGES/DRESSINGS) ×2 IMPLANT
BNDG ELASTIC 6X5.8 VLCR STR LF (GAUZE/BANDAGES/DRESSINGS) IMPLANT
BNDG ESMARK 6X9 LF (GAUZE/BANDAGES/DRESSINGS)
CANISTER SUCT 3000ML PPV (MISCELLANEOUS) ×2 IMPLANT
COVER SURGICAL LIGHT HANDLE (MISCELLANEOUS) ×2 IMPLANT
COVER WAND RF STERILE (DRAPES) ×2 IMPLANT
CUFF TOURN SGL QUICK 34 (TOURNIQUET CUFF)
CUFF TOURN SGL QUICK 42 (TOURNIQUET CUFF) IMPLANT
CUFF TRNQT CYL 34X4.125X (TOURNIQUET CUFF) IMPLANT
DRAPE C-ARM 42X72 X-RAY (DRAPES) ×2 IMPLANT
DRAPE C-ARMOR (DRAPES) ×2 IMPLANT
DRAPE INCISE IOBAN 66X45 STRL (DRAPES) IMPLANT
DRAPE U-SHAPE 47X51 STRL (DRAPES) ×2 IMPLANT
DRILL QC 2.0 SHORT EVOS SM (DRILL) ×2
DRILL QC 2.5MM SHORT EVOS SM (DRILL) ×2
DURAPREP 26ML APPLICATOR (WOUND CARE) ×2 IMPLANT
ELECT CAUTERY BLADE 6.4 (BLADE) ×2 IMPLANT
ELECT REM PT RETURN 9FT ADLT (ELECTROSURGICAL) ×2
ELECTRODE REM PT RTRN 9FT ADLT (ELECTROSURGICAL) ×1 IMPLANT
GAUZE SPONGE 4X4 12PLY STRL (GAUZE/BANDAGES/DRESSINGS) ×2 IMPLANT
GAUZE SPONGE 4X4 16PLY XRAY LF (GAUZE/BANDAGES/DRESSINGS) ×1 IMPLANT
GAUZE XEROFORM 5X9 LF (GAUZE/BANDAGES/DRESSINGS) ×2 IMPLANT
GLOVE BIOGEL PI IND STRL 7.0 (GLOVE) ×1 IMPLANT
GLOVE BIOGEL PI INDICATOR 7.0 (GLOVE) ×1
GLOVE ECLIPSE 7.0 STRL STRAW (GLOVE) ×2 IMPLANT
GLOVE SKINSENSE NS SZ7.5 (GLOVE) ×1
GLOVE SKINSENSE STRL SZ7.5 (GLOVE) ×1 IMPLANT
GLOVE SURG SYN 7.5  E (GLOVE) ×2
GLOVE SURG SYN 7.5 E (GLOVE) ×2 IMPLANT
GOWN STRL REIN XL XLG (GOWN DISPOSABLE) ×2 IMPLANT
KIT BASIN OR (CUSTOM PROCEDURE TRAY) ×2 IMPLANT
KIT TURNOVER KIT B (KITS) ×2 IMPLANT
NDL HYPO 25GX1X1/2 BEV (NEEDLE) IMPLANT
NEEDLE HYPO 25GX1X1/2 BEV (NEEDLE) IMPLANT
NS IRRIG 1000ML POUR BTL (IV SOLUTION) ×2 IMPLANT
PACK ORTHO EXTREMITY (CUSTOM PROCEDURE TRAY) ×2 IMPLANT
PAD ABD 8X10 STRL (GAUZE/BANDAGES/DRESSINGS) ×1 IMPLANT
PAD ARMBOARD 7.5X6 YLW CONV (MISCELLANEOUS) ×4 IMPLANT
PAD CAST 4YDX4 CTTN HI CHSV (CAST SUPPLIES) IMPLANT
PADDING CAST COTTON 4X4 STRL (CAST SUPPLIES) ×2
PADDING CAST COTTON 6X4 STRL (CAST SUPPLIES) ×2 IMPLANT
PLATE FIB EVOS 81X2.7/3.5 5H (Plate) ×1 IMPLANT
PLATE GREATER TUBEROSITY 5H (Plate) ×1 IMPLANT
SCREW BONE 4.0X16MM OST (Screw) ×1 IMPLANT
SCREW CORT 2.7X12 EVOS (Screw) IMPLANT
SCREW CORT 2.7X15 T8 ST EVOS (Screw) ×4 IMPLANT
SCREW CORT 3.5X11 ST EVOS (Screw) ×1 IMPLANT
SCREW CORT 3.5X30 ST EVOS (Screw) ×1 IMPLANT
SCREW CORT EVOS ST 3.5X12 (Screw) ×4 IMPLANT
SCREW CORT EVOS ST T8 2.7X14MM (Screw) ×2 IMPLANT
SCREW CORTEX 3.5X24MM (Screw) ×1 IMPLANT
SCREW CTX 3.5X38MM EVOS (Screw) ×1 IMPLANT
SCREW EVOS 2.7X11 LOCK T8 (Screw) ×1 IMPLANT
SUCTION FRAZIER HANDLE 10FR (MISCELLANEOUS) ×1
SUCTION TUBE FRAZIER 10FR DISP (MISCELLANEOUS) ×1 IMPLANT
SUT ETHILON 3 0 PS 1 (SUTURE) IMPLANT
SUT VIC AB 2-0 CT1 27 (SUTURE)
SUT VIC AB 2-0 CT1 TAPERPNT 27 (SUTURE) IMPLANT
SYR CONTROL 10ML LL (SYRINGE) IMPLANT
TOWEL GREEN STERILE (TOWEL DISPOSABLE) ×4 IMPLANT
TOWEL GREEN STERILE FF (TOWEL DISPOSABLE) ×2 IMPLANT
TUBE CONNECTING 12X1/4 (SUCTIONS) ×2 IMPLANT
UNDERPAD 30X30 (UNDERPADS AND DIAPERS) ×2 IMPLANT

## 2019-08-12 NOTE — Transfer of Care (Signed)
Immediate Anesthesia Transfer of Care Note  Patient: [name]  Procedure(s) Performed: OPEN REDUCTION INTERNAL FIXATION (ORIF) RIGHT TRIMALLEOLAR ANKLE FRACTURE (Right Ankle)  Patient Location: PACU  Anesthesia Type:General  Level of Consciousness: awake, oriented and patient cooperative  Airway & Oxygen Therapy: Patient Spontanous Breathing and Patient connected to nasal cannula oxygen  Post-op Assessment: Report given to RN and Post -op Vital signs reviewed and stable  Post vital signs: Reviewed  Last Vitals:  Vitals Value Taken Time  BP 138/79 08/12/19 1343  Temp    Pulse 80 08/12/19 1345  Resp 18 08/12/19 1345  SpO2 100 % 08/12/19 1345  Vitals shown include unvalidated device data.  Last Pain:  Vitals:   08/12/19 1100  TempSrc:   PainSc: 0-No pain         Complications: No apparent anesthesia complications

## 2019-08-12 NOTE — Op Note (Signed)
Date of Surgery: 08/12/2019  INDICATIONS: [name] is a 68 y.o.-year-old female who sustained a right ankle fracture; she was indicated for open reduction and internal fixation due to the displaced nature of the articular fracture and came to the operating room today for this procedure. The patient did consent to the procedure after discussion of the risks and benefits.  PREOPERATIVE DIAGNOSIS: right bimalleolar ankle fracture  POSTOPERATIVE DIAGNOSIS: Same.  PROCEDURE: Open treatment of left ankle fracture with internal fixation. Bimalleolar CPT [phone]  SURGEON: N. Eduard Roux, M.D.  ASSIST: Ciro Backer [place], [place]; necessary for the timely completion of procedure and due to complexity of procedure.  ANESTHESIA:  general, popliteal block  TOURNIQUET TIME: less than 60 mins  IV FLUIDS AND URINE: See anesthesia.  ESTIMATED BLOOD LOSS: minimal mL.  IMPLANTS: Tamala Julian and Nephew  COMPLICATIONS: see description of procedure.  DESCRIPTION OF PROCEDURE: The patient was brought to the operating room and placed supine on the operating table.  The patient had been signed prior to the procedure and this was documented. The patient had the anesthesia placed by the anesthesiologist.  A nonsterile tourniquet was placed on the upper thigh.  The prep verification and incision time-outs were performed to confirm that this was the correct patient, site, side and location. The patient had an SCD on the opposite lower extremity. The patient did receive antibiotics prior to the incision and was re-dosed during the procedure as needed at indicated intervals.  The patient had the lower extremity prepped and draped in the standard surgical fashion.  The extremity was exsanguinated using an esmarch bandage and the tourniquet was inflated to 300 mm Hg.  A new incision was made on the lateral aspect of the ankle.  Full-thickness flaps were elevated.  Soft tissues were elevated off of the fibula.  The fracture was  visualized.  Of note the bone quality was poor.  A reduction of the fracture was then obtained by using her rotation, traction, tenaculum clamp.  An anterior to posterior lag screw was then placed using standard AO technique.  The screw did have good purchase.  The clamp was removed.  We then placed a precontoured fibular plate on the lateral aspect of the distal fibula at the appropriate position.  A series of nonlocking and locking screws were placed through the plate into the fibula.  There was good fixation of each screw.  After this was done I then made a separate incision on the medial side of the ankle.  Again full-thickness flaps were elevated.  The medial malleolus fracture was then visualized.  The fracture was too small to accommodate 2 cannulated screws therefore I made the decision to use a hook plate for fixation.  The fracture was provisionally fixed with a K wire and the hook plate was placed on the distal fibula at the appropriate position using fluoroscopic guidance.  2 parallel nonlocking screws were then placed in compression in order to contoured the plate to the tibia and gain compression across the fracture.  The K wire was removed.  The fracture remained reduced.  Final x-rays were taken.  Stress exam of the ankle showed no widening of the medial clear space.  The surgical wounds were then thoroughly irrigated with normal saline.  Layered closure was performed.  Sterile dressings were applied.  Short leg splint was placed.  Patient tolerated procedure well had no immediate complications.  POSTOPERATIVE PLAN: [name] will remain nonweightbearing on this leg for approximately 6 weeks;  [name] will return for suture removal in 2 weeks.  He will be immobilized in a short leg splint and then transitioned to a CAM walker at his first follow up appointment.  [name] will receive DVT prophylaxis based on other medications, activity level, and risk ratio of bleeding to thrombosis.  Peggy Cecil, MD [facility] 1:11 PM

## 2019-08-12 NOTE — Anesthesia Procedure Notes (Signed)
Procedure Name: LMA Insertion Date/Time: 08/12/2019 11:47 AM Performed by: Jenne Campus, CRNA Pre-anesthesia Checklist: Patient identified, Emergency Drugs available, Suction available and Patient being monitored Patient Re-evaluated:Patient Re-evaluated prior to induction Oxygen Delivery Method: Circle System Utilized Preoxygenation: Pre-oxygenation with 100% oxygen Induction Type: IV induction Ventilation: Mask ventilation without difficulty LMA: LMA inserted LMA Size: 4.0 Number of attempts: 1 Airway Equipment and Method: Bite block Placement Confirmation: positive ETCO2 and breath sounds checked- equal and bilateral Tube secured with: Tape Dental Injury: Teeth and Oropharynx as per pre-operative assessment

## 2019-08-12 NOTE — Anesthesia Postprocedure Evaluation (Signed)
Anesthesia Post Note  Patient: LAVANA HUCKEBA  Procedure(s) Performed: OPEN REDUCTION INTERNAL FIXATION (ORIF) RIGHT TRIMALLEOLAR ANKLE FRACTURE (Right Ankle)     Patient location during evaluation: PACU Anesthesia Type: Regional and General Level of consciousness: awake and alert Pain management: pain level controlled Vital Signs Assessment: post-procedure vital signs reviewed and stable Respiratory status: spontaneous breathing, nonlabored ventilation, respiratory function stable and patient connected to nasal cannula oxygen Cardiovascular status: blood pressure returned to baseline and stable Postop Assessment: no apparent nausea or vomiting Anesthetic complications: no    Last Vitals:  Vitals:   08/12/19 1415 08/12/19 1428  BP: (!) 123/92 139/77  Pulse: 81 77  Resp: 18 16  Temp: (!) 36.3 C   SpO2: 95% 93%    Last Pain:  Vitals:   08/12/19 1428  TempSrc:   PainSc: 0-No pain                 Ryan P Ellender

## 2019-08-12 NOTE — Anesthesia Procedure Notes (Signed)
Anesthesia Regional Block: Popliteal block   Pre-Anesthetic Checklist: ,, timeout performed, Correct Patient, Correct Site, Correct Laterality, Correct Procedure,, site marked, risks and benefits discussed, Surgical consent,  Pre-op evaluation,  At surgeon's request and post-op pain management  Laterality: Right  Prep: chloraprep       Needles:  Injection technique: Single-shot  Needle Type: Echogenic Stimulator Needle     Needle Length: 10cm  Needle Gauge: 21     Additional Needles:   Procedures:,,,, ultrasound used (permanent image in chart),,,,  Narrative:  Start time: 08/12/2019 10:30 AM End time: 08/12/2019 10:40 AM Injection made incrementally with aspirations every 5 mL.  Performed by: Personally  Anesthesiologist: Murvin Natal, MD  Additional Notes: Functioning IV was confirmed and monitors were applied.  A 159mm 21ga Pajunk echogenic stimulator needle was used. Sterile prep and drape,hand hygiene and sterile gloves were used.  Negative aspiration and negative test dose prior to incremental administration of local anesthetic. The patient tolerated the procedure well.

## 2019-08-12 NOTE — Anesthesia Procedure Notes (Signed)
Anesthesia Regional Block: Adductor canal block   Pre-Anesthetic Checklist: ,, timeout performed, Correct Patient, Correct Site, Correct Laterality, Correct Procedure,, site marked, risks and benefits discussed, Surgical consent,  Pre-op evaluation,  At surgeon's request and post-op pain management  Laterality: Right  Prep: chloraprep       Needles:  Injection technique: Single-shot  Needle Type: Echogenic Stimulator Needle     Needle Length: 10cm  Needle Gauge: 21     Additional Needles:   Procedures:,,,, ultrasound used (permanent image in chart),,,,  Narrative:  Start time: 08/12/2019 10:40 AM End time: 08/12/2019 10:50 AM Injection made incrementally with aspirations every 5 mL.  Performed by: Personally  Anesthesiologist: Murvin Natal, MD  Additional Notes: Functioning IV was confirmed and monitors were applied. A time-out was performed. Hand hygiene and sterile gloves were used. The thigh was placed in a frog-leg position and prepped in a sterile fashion. A 12mm 21ga Pajunk echogenic stimulator needle was placed using ultrasound guidance.  Negative aspiration and negative test dose prior to incremental administration of local anesthetic. The patient tolerated the procedure well.

## 2019-08-12 NOTE — H&P (Signed)
PREOPERATIVE H&P  Chief Complaint: right trimalleolar ankle fracture  HPI: [name] is a 68 y.o. female who presents for surgical treatment of right trimalleolar ankle fracture.  She denies any changes in medical history.  Past Medical History:  Diagnosis Date  . ATN (acute tubular necrosis) ([place]) 07/15/2014  . Automatic implantable cardioverter-defibrillator in situ   . Automatic implantable cardioverter-defibrillator in situ 05/04/2007   Qualifier: Diagnosis of  By: Isla Pence    . Cardiac defibrillator in situ    Atlas II VR (SJM) implanted by Dr Lovena Le  . CHF (congestive heart failure) ([place])   . Chronic combined systolic and diastolic heart failure (HCC)    a. EF 35-40% in past;  b. Echo 7/13:  EF 45-50%, Gr 2 diast dysfn, mild AI, mild MAC, trivial MR, mild LAE, PASP 47.  [name] Chronic ulcer of leg ([place])    04-09-15 resolved-not a problem.  . Colon polyps 04/12/2013   Rectosigmoid polyp  . COPD (chronic obstructive pulmonary disease) (HCC)    O2 at night  . Depression   . Diabetes mellitus   . Diabetes mellitus, type 2 ([place]) 04/03/2012   HIGH RISK FEET.. Please have patient take shoes and socks off every visit for visual foot inspection.    . Eczema   . Elevated alkaline phosphatase level    GGT and 5'nucleotidase 8/13 normal  . Health care maintenance 01/22/2013   Surgically absent cervix- no pap needed (Path report 07/2000: uterine body with attached bilateral adnexa and separate cervix.)  . History of oxygen administration    oxygen @ 2 l/m nasally bedtime 24/7  . HTN (hypertension)   . Hx of cardiac cath    a. [place] 2003 normal;  b. [facility] 6/13:  Mild calcification in the LM, o/w normal coronary arteries, EF 45%.   . Hyperkalemia 08/08/2017  . Hyperlipidemia   . Hyperlipidemia associated with type 2 diabetes mellitus ([place]) 05/25/2007   Qualifier: Diagnosis of  By: Hassell Done FNP, Tori Milks    . Hyperthyroidism, subclinical   . Implantable cardioverter-defibrillator (ICD)  generator end of life   . Lipoma   . Need for hepatitis C screening test 04/25/2019  . NICM (nonischemic cardiomyopathy) ([place])   . Obesity   . On home oxygen therapy    "2L; 24/7" (10/11/2016)  . Post menopausal syndrome   . Shortness of breath 07/14/2017  . Skin rash 07/12/2018  . Sleep apnea    pt denies 04/12/2013   Past Surgical History:  Procedure Laterality Date  . ABDOMINAL HYSTERECTOMY    . CARDIAC CATHETERIZATION    . CARDIAC CATHETERIZATION N/A 06/21/2016   Procedure: Left Heart Cath and Coronary Angiography;  Surgeon: Sherren Mocha, MD;  Location: [facility];  Service: Cardiovascular;  Laterality: N/A;  . CARDIAC DEFIBRILLATOR PLACEMENT  05/04/2007   SJM Atlas II VR ICD  . CARDIAC DEFIBRILLATOR PLACEMENT    . CATARACT EXTRACTION     od  . COLONOSCOPY N/A 04/12/2013   Procedure: COLONOSCOPY;  Surgeon: Beryle Beams, MD;  Location: [facility];  Service: Endoscopy;  Laterality: N/A;  pt.has defibrilator  . COLONOSCOPY N/A 04/16/2015   Procedure: COLONOSCOPY;  Surgeon: [name], MD;  Location: [facility];  Service: Endoscopy;  Laterality: N/A;  . ESOPHAGOGASTRODUODENOSCOPY N/A 04/16/2015   Procedure: ESOPHAGOGASTRODUODENOSCOPY (EGD);  Surgeon: [name], MD;  Location: [facility];  Service: Endoscopy;  Laterality: N/A;  . EYE SURGERY    . HERNIA REPAIR    .  HYSTEROSCOPY    . IMPLANTABLE CARDIOVERTER DEFIBRILLATOR (ICD) GENERATOR CHANGE N/A 04/02/2014   Procedure: ICD GENERATOR CHANGE;  Surgeon: Evans Lance, MD;  Location: [facility] CATH LAB;  Service: Cardiovascular;  Laterality: N/A;  . INSERT / REPLACE / REMOVE PACEMAKER    . LEFT HEART CATHETERIZATION WITH CORONARY ANGIOGRAM N/A 04/02/2012   Procedure: LEFT HEART CATHETERIZATION WITH CORONARY ANGIOGRAM;  Surgeon: Hillary Bow, MD;  Location: [facility] CATH LAB;  Service: Cardiovascular;  Laterality: N/A;  . TONSILLECTOMY    . TUBAL LIGATION     Social History   Socioeconomic History  . Marital status:  Widowed    Spouse name: Alroy Dust  . Number of children: 3  . Years of education: 35  . Highest education level: Not on file  Occupational History  . Occupation: disabled  Social Needs  . Financial resource strain: Not on file  . Food insecurity    Worry: Not on file    Inability: Not on file  . Transportation needs    Medical: Not on file    Non-medical: Not on file  Tobacco Use  . Smoking status: Current Every Day Smoker    Packs/day: 0.10    Years: 39.00    Pack years: 3.90    Types: Cigarettes  . Smokeless tobacco: Never Used  . Tobacco comment: 2-3 cig/day.  Substance and Sexual Activity  . Alcohol use: No    Alcohol/week: 1.0 standard drinks    Types: 1 Glasses of wine per week  . Drug use: No  . Sexual activity: Not Currently  Lifestyle  . Physical activity    Days per week: Not on file    Minutes per session: Not on file  . Stress: Not on file  Relationships  . Social Herbalist on phone: Not on file    Gets together: Not on file    Attends religious service: Not on file    Active member of club or organization: Not on file    Attends meetings of clubs or organizations: Not on file    Relationship status: Not on file  Other Topics Concern  . Not on file  Social History Narrative   ** Merged History Encounter **       Married   Family History  Problem Relation Age of Onset  . Stroke Mother   . Seizures Father   . Heart disease Father   . Diabetes Sister   . Asthma Maternal Aunt        aunts  . Asthma Maternal Uncle        uncles  . Heart disease Paternal Aunt        aunts  . Heart disease Paternal Uncle        uncles  . Heart disease Maternal Aunt        aunts  . Heart disease Maternal Uncle        uncles  . Heart disease Maternal Grandfather   . Colon cancer Neg Hx   . Colon polyps Neg Hx   . Esophageal cancer Neg Hx   . Kidney disease Neg Hx   . Gallbladder disease Neg Hx    Allergies  Allergen Reactions  . Actos  [Pioglitazone] Other (See Comments)    REACTION: congestive heart failure   . Naproxen Other (See Comments)    59m dose made her sleep for two days  . Rosiglitazone Hives  . Hydrocodone-Acetaminophen Itching  . Hydrocortisone Hives, Itching and Other (See  Comments)    unknown   Prior to Admission medications   Medication Sig Start Date End Date Taking? Authorizing Provider  albuterol (PROVENTIL) (2.5 MG/3ML) 0.083% nebulizer solution Take 3 mLs (2.5 mg total) by nebulization every 6 (six) hours as needed for wheezing or shortness of breath. 02/20/19  Yes Harbrecht, Purcell Nails, MD  ANORO ELLIPTA 62.5-25 MCG/INH AEPB INHALE 1 PUFF INTO THE LUNGS DAILY Patient taking differently: Inhale 1 puff into the lungs daily.  04/01/19  Yes Kathi Ludwig, MD  aspirin EC 81 MG tablet Take 1 tablet (81 mg total) by mouth daily. 11/13/15  Yes Burns, Arloa Koh, MD  atorvastatin (LIPITOR) 80 MG tablet Take 1 tablet (80 mg total) by mouth at bedtime. 11/28/18  Yes Velna Ochs, MD  betamethasone dipropionate 0.05 % cream APPLY EXTERNALLY TO THE AFFECTED AREA TWICE DAILY Patient taking differently: Apply 1 application topically 2 (two) times daily. Rash on arms 07/08/19  Yes Harbrecht, Purcell Nails, MD  furosemide (LASIX) 80 MG tablet TAKE 1 TABLET BY MOUTH TWICE DAILY Patient taking differently: Take 80 mg by mouth 2 (two) times daily.  03/20/19  Yes Kathi Ludwig, MD  liraglutide (VICTOZA) 18 MG/3ML SOPN Inject 0.3 mLs (1.8 mg total) into the skin daily. 03/15/19  Yes Kathi Ludwig, MD  lisinopril (ZESTRIL) 40 MG tablet TAKE 1 TABLET(40 MG) BY MOUTH DAILY Patient taking differently: Take 40 mg by mouth daily.  05/28/19  Yes Kathi Ludwig, MD  metoprolol succinate (TOPROL-XL) 50 MG 24 hr tablet Take 2 tablets (100 mg total) by mouth daily. 06/19/19  Yes Kathi Ludwig, MD  nitroGLYCERIN (NITROSTAT) 0.4 MG SL tablet Place 1 tablet (0.4 mg total) under the tongue every 5 (five) minutes as  needed for chest pain. Reported on 03/18/2016 06/27/18  Yes Forde Dandy, PharmD  spironolactone (ALDACTONE) 25 MG tablet TAKE 0.5 TABLETS BY MOUTH EVERY DAY Patient taking differently: Take 12.5 mg by mouth daily.  05/28/19  Yes Kathi Ludwig, MD  Blood Glucose Monitoring Suppl (CONTOUR NEXT MONITOR) w/Device KIT 1 Act by Does not apply route 4 (four) times daily. 07/11/18   Kathi Ludwig, MD  Empagliflozin-metFORMIN HCl ER (SYNJARDY XR) 01-999 MG TB24 Take 1 tablet by mouth 2 (two) times daily with a meal. Patient not taking: Reported on 08/06/2019 03/15/19   Kathi Ludwig, MD  glucose blood (CONTOUR NEXT TEST) test strip Use as instructed 07/11/18   Kathi Ludwig, MD  HYDROcodone-acetaminophen (NORCO/VICODIN) 5-325 MG tablet Take 1 tablet by mouth every 4 (four) hours as needed. 08/04/19   Tacy Learn, PA-C  Lancets Misc. (ACCU-CHEK FASTCLIX LANCET) KIT Lancing device, use with lancets for blood glucose monitoring. 07/17/18   Kathi Ludwig, MD  Lancets MISC 1 Act by Does not apply route 4 (four) times daily. 07/11/18   Kathi Ludwig, MD     Positive ROS: All other systems have been reviewed and were otherwise negative with the exception of those mentioned in the HPI and as above.  Physical Exam: General: Alert, no acute distress Cardiovascular: No pedal edema Respiratory: No cyanosis, no use of accessory musculature GI: abdomen soft Skin: No lesions in the area of chief complaint Neurologic: Sensation intact distally Psychiatric: Patient is competent for consent with normal mood and affect Lymphatic: no lymphedema  MUSCULOSKELETAL: exam stable  Assessment: right trimalleolar ankle fracture  Plan: Plan for Procedure(s): OPEN REDUCTION INTERNAL FIXATION (ORIF) RIGHT TRIMALLEOLAR ANKLE FRACTURE  The risks benefits and alternatives were discussed with the patient including but not limited to the risks of  nonoperative treatment, versus surgical  intervention including infection, bleeding, nerve injury,  blood clots, cardiopulmonary complications, morbidity, mortality, among others, and they were willing to proceed.   Eduard Roux, MD   08/12/2019 9:22 AM

## 2019-08-12 NOTE — Discharge Instructions (Signed)
° ° °  1. Keep splint clean and dry °2. Elevate foot above level of the heart °3. Take aspirin to prevent blood clots °4. Take pain meds as needed °5. Strict non weight bearing to operative extremity ° °

## 2019-08-13 ENCOUNTER — Encounter (HOSPITAL_COMMUNITY): Payer: Self-pay | Admitting: Orthopaedic Surgery

## 2019-08-14 ENCOUNTER — Encounter (HOSPITAL_COMMUNITY): Payer: Self-pay | Admitting: Orthopaedic Surgery

## 2019-08-14 ENCOUNTER — Institutional Professional Consult (permissible substitution): Payer: Medicare Other | Admitting: Pulmonary Disease

## 2019-08-27 ENCOUNTER — Ambulatory Visit (INDEPENDENT_AMBULATORY_CARE_PROVIDER_SITE_OTHER): Payer: Medicare Other

## 2019-08-27 ENCOUNTER — Other Ambulatory Visit: Payer: Self-pay

## 2019-08-27 ENCOUNTER — Ambulatory Visit (INDEPENDENT_AMBULATORY_CARE_PROVIDER_SITE_OTHER): Payer: Medicare Other | Admitting: Physician Assistant

## 2019-08-27 DIAGNOSIS — S82841A Displaced bimalleolar fracture of right lower leg, initial encounter for closed fracture: Secondary | ICD-10-CM | POA: Diagnosis not present

## 2019-08-27 MED ORDER — OXYCODONE-ACETAMINOPHEN 5-325 MG PO TABS: 1.0000 | ORAL_TABLET | ORAL | 0 refills | Status: DC | PRN

## 2019-08-27 MED ORDER — OXYCODONE-ACETAMINOPHEN 5-325 MG PO TABS: 1.0000 | ORAL_TABLET | Freq: Three times a day (TID) | ORAL | 0 refills | Status: DC | PRN

## 2019-08-27 NOTE — Progress Notes (Signed)
Post-Op Visit Note   Patient: [name]           Date of Birth: 1950/11/09           MRN: [id number] Visit Date: 08/27/2019 PCP: Kathi Ludwig, MD   Assessment & Plan:  Chief Complaint:  Chief Complaint  Patient presents with  . Right Ankle - Routine Post Op   Visit Diagnoses:  1. Closed bimalleolar fracture of right ankle, initial encounter     Plan: Patient is a pleasant 68 year old female who presents our clinic today 15 days status post ORIF right bimalleolar ankle fracture, date of surgery 08/12/2019.  She has been doing well.  She has been compliant nonweightbearing.  She notes that she has been elevating her leg.  No fevers or chills.  She has been taking Norco as needed for pain.  Examination of her right lower extremity reveals moderate swelling.  Calf is soft and nontender.  She does have well-healing surgical incisions with nylon sutures in place.  No evidence of infection or cellulitis.  She is able to wiggle her toes.  At this point, nylon sutures were removed and Steri-Strips applied.  We will transition her into a cam walker nonweightbearing.  She will ice and elevate as much as possible for pain and swelling.  She will follow-up with [place] in 4 weeks time for repeat evaluation and 3 view x-rays of the right ankle.  Norco refilled.  Call with concerns or questions in the meantime.  Follow-Up Instructions: Return in about 4 weeks (around 09/24/2019).   Orders:  Orders Placed This Encounter  Procedures  . XR Ankle Complete Right   Meds ordered this encounter  Medications  . DISCONTD: oxyCODONE-acetaminophen (PERCOCET) 5-325 MG tablet    Sig: Take 1-2 tablets by mouth every 4 (four) hours as needed for severe pain.    Dispense:  60 tablet    Refill:  0  . oxyCODONE-acetaminophen (PERCOCET) 5-325 MG tablet    Sig: Take 1-2 tablets by mouth every 8 (eight) hours as needed for severe pain.    Dispense:  30 tablet    Refill:  0    Imaging: Xr Ankle Complete  Right  Result Date: 08/27/2019 Stable alignment of the fractures without interval change   PMFS History: Patient Active Problem List   Diagnosis Date Noted  . Bimalleolar ankle fracture, right, closed, initial encounter   . Trapezius muscle strain 06/19/2019  . Skin rash 07/12/2018  . Need for immunization against influenza 06/13/2018  . 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 Chronic combined systolic and diastolic congestive heart failure ([place]) 03/18/2016  . Hypoxia   . Spinal stenosis of lumbar region 12/09/2015  . Restrictive lung disease 06/24/2015  . Diabetic gastroparesis associated with type 2 diabetes mellitus ([place]) 07/12/2014  . Depression 05/22/2014  . DMII (diabetes mellitus, type 2) ([place]) 04/03/2012  . TOBACCO ABUSE 10/26/2009  . HYPERTHYROIDISM, SUBCLINICAL 05/06/2009  . Essential hypertension 05/25/2007   Past Medical History:  Diagnosis Date  . ATN (acute tubular necrosis) ([place]) 07/15/2014  . Automatic implantable cardioverter-defibrillator in situ   . Automatic implantable cardioverter-defibrillator in situ 05/04/2007   Qualifier: Diagnosis of  By: Isla Pence    . Cardiac defibrillator in situ    Atlas II VR (SJM) implanted by Dr Lovena Le  . CHF (congestive heart failure) ([place])   . Chronic combined systolic and diastolic heart failure (HCC)    a. EF 35-40% in past;  b. Echo 7/13:  EF 45-50%,  Gr 2 diast dysfn, mild AI, mild MAC, trivial MR, mild LAE, PASP 47.  [name] Chronic ulcer of leg ([place])    04-09-15 resolved-not a problem.  . Colon polyps 04/12/2013   Rectosigmoid polyp  . COPD (chronic obstructive pulmonary disease) (HCC)    O2 at night  . Depression   . Diabetes mellitus   . Diabetes mellitus, type 2 ([place]) 04/03/2012   HIGH RISK FEET.. Please have patient take shoes and socks off every visit for visual foot inspection.    . Eczema   . Elevated alkaline phosphatase level    GGT and 5'nucleotidase 8/13 normal  . Health care maintenance  01/22/2013   Surgically absent cervix- no pap needed (Path report 07/2000: uterine body with attached bilateral adnexa and separate cervix.)  . History of oxygen administration    oxygen @ 2 l/m nasally bedtime 24/7  . HTN (hypertension)   . Hx of cardiac cath    a. [place] 2003 normal;  b. LHC 6/13:  Mild calcification in the LM, o/w normal coronary arteries, EF 45%.   . Hyperkalemia 08/08/2017  . Hyperlipidemia   . Hyperlipidemia associated with type 2 diabetes mellitus ([place]) 05/25/2007   Qualifier: Diagnosis of  By: Hassell Done FNP, Tori Milks    . Hyperthyroidism, subclinical   . Implantable cardioverter-defibrillator (ICD) generator end of life   . Lipoma   . Need for hepatitis C screening test 04/25/2019  . NICM (nonischemic cardiomyopathy) ([place])   . Obesity   . On home oxygen therapy    "2L; 24/7" (10/11/2016)  . Post menopausal syndrome   . Shortness of breath 07/14/2017  . Skin rash 07/12/2018  . Sleep apnea    pt denies 04/12/2013    Family History  Problem Relation Age of Onset  . Stroke Mother   . Seizures Father   . Heart disease Father   . Diabetes Sister   . Asthma Maternal Aunt        aunts  . Asthma Maternal Uncle        uncles  . Heart disease Paternal Aunt        aunts  . Heart disease Paternal Uncle        uncles  . Heart disease Maternal Aunt        aunts  . Heart disease Maternal Uncle        uncles  . Heart disease Maternal Grandfather   . Colon cancer Neg Hx   . Colon polyps Neg Hx   . Esophageal cancer Neg Hx   . Kidney disease Neg Hx   . Gallbladder disease Neg Hx     Past Surgical History:  Procedure Laterality Date  . ABDOMINAL HYSTERECTOMY    . CARDIAC CATHETERIZATION    . CARDIAC CATHETERIZATION N/A 06/21/2016   Procedure: Left Heart Cath and Coronary Angiography;  Surgeon: Sherren Mocha, MD;  Location: [place] [facility];  Service: Cardiovascular;  Laterality: N/A;  . CARDIAC DEFIBRILLATOR PLACEMENT  05/04/2007   SJM Atlas II VR ICD  . CARDIAC  DEFIBRILLATOR PLACEMENT    . CATARACT EXTRACTION     od  . COLONOSCOPY N/A 04/12/2013   Procedure: COLONOSCOPY;  Surgeon: Beryle Beams, MD;  Location: [facility];  Service: Endoscopy;  Laterality: N/A;  pt.has defibrilator  . COLONOSCOPY N/A 04/16/2015   Procedure: COLONOSCOPY;  Surgeon: [name], MD;  Location: [facility];  Service: Endoscopy;  Laterality: N/A;  . ESOPHAGOGASTRODUODENOSCOPY N/A 04/16/2015   Procedure: ESOPHAGOGASTRODUODENOSCOPY (EGD);  Surgeon:  [name], MD;  Location: [facility];  Service: Endoscopy;  Laterality: N/A;  . EYE SURGERY    . HERNIA REPAIR    . HYSTEROSCOPY    . IMPLANTABLE CARDIOVERTER DEFIBRILLATOR (ICD) GENERATOR CHANGE N/A 04/02/2014   Procedure: ICD GENERATOR CHANGE;  Surgeon: Evans Lance, MD;  Location: [facility];  Service: Cardiovascular;  Laterality: N/A;  . INSERT / REPLACE / REMOVE PACEMAKER    . LEFT HEART CATHETERIZATION WITH CORONARY ANGIOGRAM N/A 04/02/2012   Procedure: LEFT HEART CATHETERIZATION WITH CORONARY ANGIOGRAM;  Surgeon: Hillary Bow, MD;  Location: [facility] CATH LAB;  Service: Cardiovascular;  Laterality: N/A;  . ORIF ANKLE FRACTURE Right 08/12/2019   Procedure: OPEN REDUCTION INTERNAL FIXATION (ORIF) RIGHT TRIMALLEOLAR ANKLE FRACTURE;  Surgeon: Leandrew Koyanagi, MD;  Location: [place];  Service: Orthopedics;  Laterality: Right;  . TONSILLECTOMY    . TUBAL LIGATION     Social History   Occupational History  . Occupation: disabled  Tobacco Use  . Smoking status: Current Every Day Smoker    Packs/day: 0.10    Years: 39.00    Pack years: 3.90    Types: Cigarettes  . Smokeless tobacco: Never Used  . Tobacco comment: 2-3 cig/day.  Substance and Sexual Activity  . Alcohol use: No    Alcohol/week: 1.0 standard drinks    Types: 1 Glasses of wine per week  . Drug use: No  . Sexual activity: Not Currently

## 2019-08-30 DIAGNOSIS — J449 Chronic obstructive pulmonary disease, unspecified: Secondary | ICD-10-CM | POA: Diagnosis not present

## 2019-08-30 DIAGNOSIS — I509 Heart failure, unspecified: Secondary | ICD-10-CM | POA: Diagnosis not present

## 2019-09-06 ENCOUNTER — Other Ambulatory Visit: Payer: Self-pay | Admitting: Internal Medicine

## 2019-09-06 DIAGNOSIS — R21 Rash and other nonspecific skin eruption: Secondary | ICD-10-CM

## 2019-09-10 ENCOUNTER — Other Ambulatory Visit: Payer: Self-pay

## 2019-09-10 ENCOUNTER — Other Ambulatory Visit: Payer: Self-pay | Admitting: Internal Medicine

## 2019-09-10 ENCOUNTER — Ambulatory Visit (INDEPENDENT_AMBULATORY_CARE_PROVIDER_SITE_OTHER): Payer: Medicare Other | Admitting: Student

## 2019-09-10 DIAGNOSIS — Z794 Long term (current) use of insulin: Secondary | ICD-10-CM

## 2019-09-10 DIAGNOSIS — E1142 Type 2 diabetes mellitus with diabetic polyneuropathy: Secondary | ICD-10-CM

## 2019-09-10 DIAGNOSIS — I5042 Chronic combined systolic (congestive) and diastolic (congestive) heart failure: Secondary | ICD-10-CM

## 2019-09-10 DIAGNOSIS — I428 Other cardiomyopathies: Secondary | ICD-10-CM | POA: Diagnosis not present

## 2019-09-10 LAB — CUP PACEART IMPLANT DEVICE CHECK
Battery Remaining Longevity: 57 mo
Brady Statistic RV Percent Paced: 0 %
Date Time Interrogation Session: 20201208105300
HighPow Impedance: 42.9868
Implantable Lead Implant Date: 20080801
Implantable Lead Location: 753860
Implantable Lead Model: 7120
Implantable Pulse Generator Implant Date: 20150701
Lead Channel Impedance Value: 412.5 Ohm
Lead Channel Pacing Threshold Amplitude: 0.5 V
Lead Channel Pacing Threshold Amplitude: 0.5 V
Lead Channel Pacing Threshold Pulse Width: 0.5 ms
Lead Channel Pacing Threshold Pulse Width: 0.5 ms
Lead Channel Sensing Intrinsic Amplitude: 11.9 mV
Lead Channel Setting Pacing Amplitude: 2.5 V
Lead Channel Setting Pacing Pulse Width: 0.5 ms
Lead Channel Setting Sensing Sensitivity: 0.5 mV
Pulse Gen Serial Number: 7206538

## 2019-09-10 NOTE — Progress Notes (Signed)
ICD check in clinic. Normal device function. Thresholds and sensing consistent with previous device measurements. Impedance trends stable over time. No mode switches. No ventricular arrhythmias. Histogram distribution appropriate for patient and level of activity. No changes made this session. Device programmed at appropriate safety margins. Estimated longevity 4 yr, 9 mo. Pt refuses remote follow-up. RTC to see Dr. Lovena Le in 3 months.

## 2019-09-18 ENCOUNTER — Ambulatory Visit: Payer: Medicare Other

## 2019-09-19 DIAGNOSIS — L2084 Intrinsic (allergic) eczema: Secondary | ICD-10-CM | POA: Insufficient documentation

## 2019-09-24 ENCOUNTER — Encounter: Payer: Self-pay | Admitting: Orthopaedic Surgery

## 2019-09-24 ENCOUNTER — Other Ambulatory Visit: Payer: Self-pay

## 2019-09-24 ENCOUNTER — Ambulatory Visit: Payer: Self-pay

## 2019-09-24 ENCOUNTER — Ambulatory Visit (INDEPENDENT_AMBULATORY_CARE_PROVIDER_SITE_OTHER): Payer: Medicare Other | Admitting: Orthopaedic Surgery

## 2019-09-24 DIAGNOSIS — S82841A Displaced bimalleolar fracture of right lower leg, initial encounter for closed fracture: Secondary | ICD-10-CM

## 2019-09-24 MED ORDER — OXYCODONE HCL 5 MG PO CAPS: 5.0000 mg | ORAL_CAPSULE | Freq: Three times a day (TID) | ORAL | 0 refills | Status: DC | PRN

## 2019-09-24 NOTE — Progress Notes (Signed)
Post-Op Visit Note   Patient: [name]           Date of Birth: November 24, 1950           MRN: [id number] Visit Date: 09/24/2019 PCP: Kathi Ludwig, MD   Assessment & Plan:  Chief Complaint:  Chief Complaint  Patient presents with  . Right Ankle - Pain   Visit Diagnoses:  1. Closed bimalleolar fracture of right ankle, initial encounter     Plan: Patient is a pleasant 68 year old female who presents our clinic today 6 weeks status post ORIF right trimalleolar ankle fracture, date of surgery 08/12/2019.  She notes that she has been weightbearing in her cam walker over the past 2 weeks.  She denies any significant pain to the right ankle.  She has been taking oxycodone as needed.  No fevers or chills.  Examination of her right ankle reveals fully healed surgical scars without complication.  She has minimal tenderness to the medial lateral fractures.  Mild swelling to the ankle.  She does have slightly limited range of motion of the ankle secondary to stiffness.  Calf is soft nontender.  She is neurovascular intact distally.  Today, will allow her to be full weightbearing in her cam walker.  We will start her in physical therapy to work on range of motion stabilization exercises.  Internal prescription was sent 10.  We will provide her with an ASO that she can use to sleep with as well as transition into with physical therapy when ready.  I Have refilled her oxycodone as she has an allergy to Norco.  She will use this sparingly.  She will follow-up with [place] in 6 weeks time for repeat evaluation three-view x-rays of the right ankle.  Follow-Up Instructions: Return in about 6 weeks (around 11/05/2019).   Orders:  Orders Placed This Encounter  Procedures  . XR Ankle Complete Right  . Ambulatory referral to Physical Therapy   Meds ordered this encounter  Medications  . oxycodone (OXY-IR) 5 MG capsule    Sig: Take 1 capsule (5 mg total) by mouth 3 (three) times daily as needed.   Dispense:  30 capsule    Refill:  0    Imaging: XR Ankle Complete Right  Result Date: 09/24/2019 X-rays demonstrate stable alignment of the fractures with evidence of bony consolidation to the medial malleolus   PMFS History: Patient Active Problem List   Diagnosis Date Noted  . Bimalleolar ankle fracture, right, closed, initial encounter   . Trapezius muscle strain 06/19/2019  . Skin rash 07/12/2018  . Need for immunization against influenza 06/13/2018  . 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 Chronic combined systolic and diastolic congestive heart failure ([place]) 03/18/2016  . Hypoxia   . Spinal stenosis of lumbar region 12/09/2015  . Restrictive lung disease 06/24/2015  . Diabetic gastroparesis associated with type 2 diabetes mellitus ([place]) 07/12/2014  . Depression 05/22/2014  . DMII (diabetes mellitus, type 2) ([place]) 04/03/2012  . TOBACCO ABUSE 10/26/2009  . HYPERTHYROIDISM, SUBCLINICAL 05/06/2009  . Essential hypertension 05/25/2007   Past Medical History:  Diagnosis Date  . ATN (acute tubular necrosis) ([place]) 07/15/2014  . Automatic implantable cardioverter-defibrillator in situ   . Automatic implantable cardioverter-defibrillator in situ 05/04/2007   Qualifier: Diagnosis of  By: Isla Pence    . Cardiac defibrillator in situ    Atlas II VR (SJM) implanted by Dr Lovena Le  . CHF (congestive heart failure) ([place])   . Chronic combined systolic and diastolic heart  failure ([place])    a. EF 35-40% in past;  b. Echo 7/13:  EF 45-50%, Gr 2 diast dysfn, mild AI, mild MAC, trivial MR, mild LAE, PASP 47.  [name] Chronic ulcer of leg ([place])    04-09-15 resolved-not a problem.  . Colon polyps 04/12/2013   Rectosigmoid polyp  . COPD (chronic obstructive pulmonary disease) (HCC)    O2 at night  . Depression   . Diabetes mellitus   . Diabetes mellitus, type 2 ([place]) 04/03/2012   HIGH RISK FEET.. Please have patient take shoes and socks off every visit for visual foot inspection.      . Eczema   . Elevated alkaline phosphatase level    GGT and 5'nucleotidase 8/13 normal  . Health care maintenance 01/22/2013   Surgically absent cervix- no pap needed (Path report 07/2000: uterine body with attached bilateral adnexa and separate cervix.)  . History of oxygen administration    oxygen @ 2 l/m nasally bedtime 24/7  . HTN (hypertension)   . Hx of cardiac cath    a. [place] 2003 normal;  b. LHC 6/13:  Mild calcification in the LM, o/w normal coronary arteries, EF 45%.   . Hyperkalemia 08/08/2017  . Hyperlipidemia   . Hyperlipidemia associated with type 2 diabetes mellitus ([place]) 05/25/2007   Qualifier: Diagnosis of  By: Hassell Done FNP, Tori Milks    . Hyperthyroidism, subclinical   . Implantable cardioverter-defibrillator (ICD) generator end of life   . Lipoma   . Need for hepatitis C screening test 04/25/2019  . NICM (nonischemic cardiomyopathy) ([place])   . Obesity   . On home oxygen therapy    "2L; 24/7" (10/11/2016)  . Post menopausal syndrome   . Shortness of breath 07/14/2017  . Skin rash 07/12/2018  . Sleep apnea    pt denies 04/12/2013    Family History  Problem Relation Age of Onset  . Stroke Mother   . Seizures Father   . Heart disease Father   . Diabetes Sister   . Asthma Maternal Aunt        aunts  . Asthma Maternal Uncle        uncles  . Heart disease Paternal Aunt        aunts  . Heart disease Paternal Uncle        uncles  . Heart disease Maternal Aunt        aunts  . Heart disease Maternal Uncle        uncles  . Heart disease Maternal Grandfather   . Colon cancer Neg Hx   . Colon polyps Neg Hx   . Esophageal cancer Neg Hx   . Kidney disease Neg Hx   . Gallbladder disease Neg Hx     Past Surgical History:  Procedure Laterality Date  . ABDOMINAL HYSTERECTOMY    . CARDIAC CATHETERIZATION    . CARDIAC CATHETERIZATION N/A 06/21/2016   Procedure: Left Heart Cath and Coronary Angiography;  Surgeon: Sherren Mocha, MD;  Location: [place] [facility];  Service:  Cardiovascular;  Laterality: N/A;  . CARDIAC DEFIBRILLATOR PLACEMENT  05/04/2007   SJM Atlas II VR ICD  . CARDIAC DEFIBRILLATOR PLACEMENT    . CATARACT EXTRACTION     od  . COLONOSCOPY N/A 04/12/2013   Procedure: COLONOSCOPY;  Surgeon: Beryle Beams, MD;  Location: [facility];  Service: Endoscopy;  Laterality: N/A;  pt.has defibrilator  . COLONOSCOPY N/A 04/16/2015   Procedure: COLONOSCOPY;  Surgeon: [name], MD;  Location: [facility];  Service: Endoscopy;  Laterality: N/A;  . ESOPHAGOGASTRODUODENOSCOPY N/A 04/16/2015   Procedure: ESOPHAGOGASTRODUODENOSCOPY (EGD);  Surgeon: [name], MD;  Location: [facility];  Service: Endoscopy;  Laterality: N/A;  . EYE SURGERY    . HERNIA REPAIR    . HYSTEROSCOPY    . IMPLANTABLE CARDIOVERTER DEFIBRILLATOR (ICD) GENERATOR CHANGE N/A 04/02/2014   Procedure: ICD GENERATOR CHANGE;  Surgeon: Evans Lance, MD;  Location: [facility] CATH LAB;  Service: Cardiovascular;  Laterality: N/A;  . INSERT / REPLACE / REMOVE PACEMAKER    . LEFT HEART CATHETERIZATION WITH CORONARY ANGIOGRAM N/A 04/02/2012   Procedure: LEFT HEART CATHETERIZATION WITH CORONARY ANGIOGRAM;  Surgeon: Hillary Bow, MD;  Location: [facility] CATH LAB;  Service: Cardiovascular;  Laterality: N/A;  . ORIF ANKLE FRACTURE Right 08/12/2019   Procedure: OPEN REDUCTION INTERNAL FIXATION (ORIF) RIGHT TRIMALLEOLAR ANKLE FRACTURE;  Surgeon: Leandrew Koyanagi, MD;  Location: [place];  Service: Orthopedics;  Laterality: Right;  . TONSILLECTOMY    . TUBAL LIGATION     Social History   Occupational History  . Occupation: disabled  Tobacco Use  . Smoking status: Current Every Day Smoker    Packs/day: 0.10    Years: 39.00    Pack years: 3.90    Types: Cigarettes  . Smokeless tobacco: Never Used  . Tobacco comment: 2-3 cig/day.  Substance and Sexual Activity  . Alcohol use: No    Alcohol/week: 1.0 standard drinks    Types: 1 Glasses of wine per week  . Drug use: No  . Sexual activity: Not Currently

## 2019-09-24 NOTE — Addendum Note (Signed)
Addended by: Hulan Fray on: 09/24/2019 01:14 PM   Modules accepted: Orders

## 2019-09-29 DIAGNOSIS — I509 Heart failure, unspecified: Secondary | ICD-10-CM | POA: Diagnosis not present

## 2019-09-29 DIAGNOSIS — J449 Chronic obstructive pulmonary disease, unspecified: Secondary | ICD-10-CM | POA: Diagnosis not present

## 2019-10-02 ENCOUNTER — Telehealth: Payer: Self-pay | Admitting: Physician Assistant

## 2019-10-02 NOTE — Telephone Encounter (Signed)
Received call from [place] with [facility] on [address] advised Rx was sent in for Oxycodone 5 mg capsules. The insurance will only cover Oxycodone mg tablets. [employer] asked that a new Rx be sent in for Oxycodone 5 mg tablets. The number to contact Kathrin Greathouse is [phone]

## 2019-10-03 ENCOUNTER — Other Ambulatory Visit: Payer: Self-pay | Admitting: Physician Assistant

## 2019-10-03 ENCOUNTER — Ambulatory Visit: Payer: Medicare Other | Admitting: Physical Therapy

## 2019-10-03 MED ORDER — OXYCODONE-ACETAMINOPHEN 5-325 MG PO TABS: 1.0000 | ORAL_TABLET | Freq: Three times a day (TID) | ORAL | 0 refills | Status: DC | PRN

## 2019-10-03 NOTE — Telephone Encounter (Signed)
Sent in percocet tabs

## 2019-10-14 ENCOUNTER — Ambulatory Visit: Payer: Medicare Other | Attending: Physician Assistant | Admitting: Physical Therapy

## 2019-10-14 ENCOUNTER — Other Ambulatory Visit: Payer: Self-pay

## 2019-10-14 DIAGNOSIS — M6281 Muscle weakness (generalized): Secondary | ICD-10-CM | POA: Insufficient documentation

## 2019-10-14 DIAGNOSIS — M25571 Pain in right ankle and joints of right foot: Secondary | ICD-10-CM | POA: Diagnosis not present

## 2019-10-14 DIAGNOSIS — M25671 Stiffness of right ankle, not elsewhere classified: Secondary | ICD-10-CM | POA: Diagnosis not present

## 2019-10-14 DIAGNOSIS — R2689 Other abnormalities of gait and mobility: Secondary | ICD-10-CM | POA: Diagnosis not present

## 2019-10-14 DIAGNOSIS — R600 Localized edema: Secondary | ICD-10-CM | POA: Diagnosis not present

## 2019-10-14 NOTE — Therapy (Signed)
[place], [place], [address] Phone: [phone]   Fax:  [phone]  Physical Therapy Evaluation  Patient Details  Name: [name] MRN: [id number] Date of Birth: [birth year] Referring Provider (PT): Frankey Shown MD   Encounter Date: 10/14/2019  PT End of Session - 10/14/19 1043    Visit Number  1    Number of Visits  13    Date for PT Re-Evaluation  11/25/19    Authorization Type  Public transportation    PT Start Time  0017    PT Stop Time  1125    PT Time Calculation (min)  50 min    Activity Tolerance  Patient tolerated treatment well    Behavior During Therapy  [facility] for tasks assessed/performed       Past Medical History:  Diagnosis Date  . ATN (acute tubular necrosis) ([place]) 07/15/2014  . Automatic implantable cardioverter-defibrillator in situ   . Automatic implantable cardioverter-defibrillator in situ 05/04/2007   Qualifier: Diagnosis of  By: Isla Pence    . Cardiac defibrillator in situ    Atlas II VR (SJM) implanted by Dr Lovena Le  . CHF (congestive heart failure) ([place])   . Chronic combined systolic and diastolic heart failure (HCC)    a. EF 35-40% in past;  b. Echo 7/13:  EF 45-50%, Gr 2 diast dysfn, mild AI, mild MAC, trivial MR, mild LAE, PASP 47.  [name] Chronic ulcer of leg ([place])    04-09-15 resolved-not a problem.  . Colon polyps 04/12/2013   Rectosigmoid polyp  . COPD (chronic obstructive pulmonary disease) (HCC)    O2 at night  . Depression   . Diabetes mellitus   . Diabetes mellitus, type 2 ([place]) 04/03/2012   HIGH RISK FEET.. Please have patient take shoes and socks off every visit for visual foot inspection.    . Eczema   . Elevated alkaline phosphatase level    GGT and 5'nucleotidase 8/13 normal  . Health care maintenance 01/22/2013   Surgically absent cervix- no pap needed (Path report 07/2000: uterine body with attached bilateral adnexa and separate cervix.)  . History of  oxygen administration    oxygen @ 2 l/m nasally bedtime 24/7  . HTN (hypertension)   . Hx of cardiac cath    a. [place] 2003 normal;  b. [facility] 6/13:  Mild calcification in the LM, o/w normal coronary arteries, EF 45%.   . Hyperkalemia 08/08/2017  . Hyperlipidemia   . Hyperlipidemia associated with type 2 diabetes mellitus ([place]) 05/25/2007   Qualifier: Diagnosis of  By: Hassell Done FNP, Tori Milks    . Hyperthyroidism, subclinical   . Implantable cardioverter-defibrillator (ICD) generator end of life   . Lipoma   . Need for hepatitis C screening test 04/25/2019  . NICM (nonischemic cardiomyopathy) ([place])   . Obesity   . On home oxygen therapy    "2L; 24/7" (10/11/2016)  . Post menopausal syndrome   . Shortness of breath 07/14/2017  . Skin rash 07/12/2018  . Sleep apnea    pt denies 04/12/2013    Past Surgical History:  Procedure Laterality Date  . ABDOMINAL HYSTERECTOMY    . CARDIAC CATHETERIZATION    . CARDIAC CATHETERIZATION N/A 06/21/2016   Procedure: Left Heart Cath and Coronary Angiography;  Surgeon: Sherren Mocha, MD;  Location: [facility];  Service: Cardiovascular;  Laterality: N/A;  . CARDIAC DEFIBRILLATOR PLACEMENT  05/04/2007   SJM Atlas II VR ICD  . CARDIAC DEFIBRILLATOR PLACEMENT    .  CATARACT EXTRACTION     od  . COLONOSCOPY N/A 04/12/2013   Procedure: COLONOSCOPY;  Surgeon: Beryle Beams, MD;  Location: [facility];  Service: Endoscopy;  Laterality: N/A;  pt.has defibrilator  . COLONOSCOPY N/A 04/16/2015   Procedure: COLONOSCOPY;  Surgeon: [name], MD;  Location: [facility];  Service: Endoscopy;  Laterality: N/A;  . ESOPHAGOGASTRODUODENOSCOPY N/A 04/16/2015   Procedure: ESOPHAGOGASTRODUODENOSCOPY (EGD);  Surgeon: [name], MD;  Location: [facility];  Service: Endoscopy;  Laterality: N/A;  . EYE SURGERY    . HERNIA REPAIR    . HYSTEROSCOPY    . IMPLANTABLE CARDIOVERTER DEFIBRILLATOR (ICD) GENERATOR CHANGE N/A 04/02/2014   Procedure: ICD GENERATOR CHANGE;   Surgeon: Evans Lance, MD;  Location: [facility] CATH LAB;  Service: Cardiovascular;  Laterality: N/A;  . INSERT / REPLACE / REMOVE PACEMAKER    . LEFT HEART CATHETERIZATION WITH CORONARY ANGIOGRAM N/A 04/02/2012   Procedure: LEFT HEART CATHETERIZATION WITH CORONARY ANGIOGRAM;  Surgeon: Hillary Bow, MD;  Location: [facility] CATH LAB;  Service: Cardiovascular;  Laterality: N/A;  . ORIF ANKLE FRACTURE Right 08/12/2019   Procedure: OPEN REDUCTION INTERNAL FIXATION (ORIF) RIGHT TRIMALLEOLAR ANKLE FRACTURE;  Surgeon: Leandrew Koyanagi, MD;  Location: [facility];  Service: Orthopedics;  Laterality: Right;  . TONSILLECTOMY    . TUBAL LIGATION      There were no vitals filed for this visit.   Subjective Assessment - 10/14/19 1030    Subjective  Pt arriving to therapy s/p ORIF R trimalleolar ankle fx on 08/12/2019. Pt reporting a fall down the stairs at her friends house. Pt reporting she fell down 2 steps when leaving her friends house.    Pertinent History  DM, restrictive lung disease, spinal stenosis, CHF, hypoxia, HTN    How long can you sit comfortably?  unlimited    How long can you stand comfortably?  10 miuntes    How long can you walk comfortably?  5 minutes, "it's hard to walk with boot on"    Diagnostic tests  X-ray    Patient Stated Goals  Walk without the boot, get back to normal    Currently in Pain?  Yes    Pain Score  4     Pain Location  Ankle    Pain Orientation  Right    Pain Descriptors / Indicators  Aching    Pain Type  Acute pain    Pain Onset  More than a month ago    Pain Frequency  Intermittent    Aggravating Factors   walking, standing    Pain Relieving Factors  lying down, prescription pain meds    Effect of Pain on Daily Activities  difficulty, standing, household chores         [facility] PT Assessment - 10/14/19 0001      Assessment   Medical Diagnosis  R ankle trimalleolar fx s/p ORIFon 08/12/2019    Referring Provider (PT)  Frankey Shown MD    Onset Date/Surgical Date  08/12/19     Hand Dominance  Right    Next MD Visit  4 weeks     Prior Therapy  no      Precautions   Precaution Comments  Cam walker for 4 more weeks      Restrictions   Weight Bearing Restrictions  Yes    RLE Weight Bearing  Weight bearing as tolerated    Other Position/Activity Restrictions  WBAT R LE in Cam [employer]  Balance Screen   Has the patient fallen in the past 6 months  Yes    How many times?  1    Is the patient reluctant to leave their home because of a fear of falling?   Yes      [place] residence    Living Arrangements  Spouse/significant other    Type of [place] Access  Level entry    Home Layout  One level    [place] - 2 wheels;Cane - single point      Prior Function   Level of Independence  Independent with basic ADLs    Vocation  Unemployed    Leisure  play games with family      Cognition   Overall Cognitive Status  Within Functional Limits for tasks assessed      Observation/Other Assessments   Focus on Therapeutic Outcomes (FOTO)   unable to access       Observation/Other Assessments-Edema    Edema  Circumferential      Circumferential Edema   Circumferential - Right  R ankle: 28 centimeters around malleoli    Circumferential - Left   L ankle: 26.5 centimeters around malleoli      Posture/Postural Control   Posture/Postural Control  Postural limitations    Postural Limitations  Rounded Shoulders;Forward head;Increased lumbar lordosis      ROM / Strength   AROM / PROM / Strength  AROM;Strength      AROM   AROM Assessment Site  Ankle    Right/Left Ankle  Right;Left    Right Ankle Dorsiflexion  4    Right Ankle Plantar Flexion  10    Right Ankle Inversion  25    Right Ankle Eversion  10    Left Ankle Dorsiflexion  6    Left Ankle Plantar Flexion  15    Left Ankle Inversion  28    Left Ankle Eversion  20      Strength   Strength Assessment Site  Knee;Ankle    Right/Left Knee   Right;Left    Right Knee Flexion  3+/5    Right Knee Extension  3+/5    Left Knee Flexion  4+/5    Left Knee Extension  4+/5    Right/Left Ankle  Right;Left    Right Ankle Dorsiflexion  4-/5    Right Ankle Plantar Flexion  4-/5    Right Ankle Inversion  4-/5    Right Ankle Eversion  4-/5    Left Ankle Dorsiflexion  5/5    Left Ankle Plantar Flexion  5/5    Left Ankle Inversion  5/5    Left Ankle Eversion  5/5      Palpation   Palpation comment  TTP on medial and lateral malleoli and achilles      Transfers   Five time sit to stand comments   22 seconds using UE support      Ambulation/Gait   Ambulation Distance (Feet)  100 Feet    Assistive device  Straight cane    Gait Pattern  Step-through pattern;Decreased step length - left;Decreased stance time - right;Antalgic;Wide base of support;Poor foot clearance - left;Poor foot clearance - right    Ambulation Surface  Level    Gait Comments  Pt reporting no pain with short distance amb, pt using st cane in her R hand and pt was instructed to carry her cane in her  L UE.                 Objective measurements completed on examination: See above findings.      [place] Adult PT Treatment/Exercise - 10/14/19 0001      Exercises   Exercises  Ankle      Ankle Exercises: Stretches   Soleus Stretch  2 reps;30 seconds    Gastroc Stretch  2 reps;30 seconds      Ankle Exercises: Seated   ABC's  1 rep    Towel Crunch  1 rep    Other Seated Ankle Exercises  Inversion/eversion on towel x 10             PT Education - 10/14/19 1042    Education Details  PT POC    Person(s) Educated  Patient    Methods  Explanation;Demonstration    Comprehension  Verbalized understanding;Returned demonstration       PT Short Term Goals - 10/14/19 1120      PT SHORT TERM GOAL #1   Title  Pt will be independent in her HEP.    Time  3    Period  Weeks    Status  New    Target Date  11/05/19      PT SHORT TERM GOAL #2   Title   Pt will be able to demonstrate correct gait sequencing with/without LRAD.    Time  3    Period  Weeks    Status  New    Target Date  11/05/19        PT Long Term Goals - 10/14/19 1228      PT LONG TERM GOAL #1   Title  Pt will be able to amb >/= 500 feet on community level surfaces with no device with normalized gait pattern.    Baseline  amb with cam walker using a straight cane with antalgic gait pattern    Time  6    Period  Weeks    Status  New    Target Date  11/25/19      PT LONG TERM GOAL #2   Title  Pt will improve her R knee strength to >/= 4+/5 in order to improve functional mobility.    Baseline  see flow sheets    Time  6    Period  Weeks    Status  New    Target Date  11/25/19      PT LONG TERM GOAL #3   Title  Pt will be able to navigate 5 steps using a single hand rail without difficutly using step over step pattern.    Baseline  step to gait pattern using assistive device and hand rail.    Time  6    Period  Weeks    Status  New    Target Date  11/25/19      PT LONG TERM GOAL #4   Title  Pt will improve her DF AROM to >/= 10 degrees bilaterally to increase her heel strike during gait.    Baseline  see flow sheets    Time  6    Period  Weeks    Status  New    Target Date  11/25/19             Plan - 10/14/19 1118    Clinical Impression Statement  Pt presenting s/p ORIF R trimalleolar ankle fx on 08/12/2019.  Pt presenting with decresed AROM bilateral DF and overall  decreased srength in R ankle and R knee. Pt amb with  R cam walker using straight cane in R UE. Gait training performed for pt to use her cane in her L UE and pt's cane height was adjusted. Pt in 4/10 pain and tolerating exercises well today wiht no increased reports of pain. Pt was given a HEP and was able to demonstrate correct technques. Pt to f/u with her MD in 4 weeks to see if cam walker can be discontinued. Pt with SOB noted when bending over to doff her cam walker but pt recovered  quickly after returning to upright sitting. Pt also presenting with 1.5 centimeter increased swelling in her R compared to her left ankle. Pt was instructed to ice and elevate as needed. Skilled PT needed to progress pt toward her PLOF.    Personal Factors and Comorbidities  Comorbidity 3+    Comorbidities  COPD, CHF,HTN depression, hypoxia, spinal stenosis    Examination-Activity Limitations  Lift;Stand;Transfers;Carry;Dressing;Stairs;Squat    Examination-Participation Restrictions  Community Activity;Other;Laundry    Stability/Clinical Decision Making  Stable/Uncomplicated    Clinical Decision Making  Low    Rehab Potential  Good    PT Frequency  2x / week    PT Duration  6 weeks    PT Treatment/Interventions  ADLs/Self Care Home Management;Cryotherapy;Electrical Stimulation;Iontophoresis 60m/ml Dexamethasone;Ultrasound;Gait training;Stair training;Functional mobility training;Therapeutic activities;Therapeutic exercise;Balance training;Neuromuscular re-education;Patient/family education;Manual techniques;Vasopneumatic Device;Taping;Passive range of motion    PT Next Visit Plan  vasopneumatic, ankle ROM, strengthening, gait training    PT Home Exercise Plan  Access Code: [email or web address] (ABC's, inversion/eversion with towel, towel scrunches, DF stretch seated)    Consulted and Agree with Plan of Care  Patient       Patient will benefit from skilled therapeutic intervention in order to improve the following deficits and impairments:  Obesity, Difficulty walking, Increased edema, Decreased strength, Decreased range of motion, Decreased activity tolerance, Postural dysfunction, Pain, Abnormal gait  Visit Diagnosis: Pain in right ankle and joints of right foot  Stiffness of right ankle, not elsewhere classified  Muscle weakness (generalized)  Other abnormalities of gait and mobility     Problem List Patient Active Problem List   Diagnosis Date Noted  . Bimalleolar ankle fracture, right,  closed, initial encounter   . Trapezius muscle strain 06/19/2019  . Skin rash 07/12/2018  . Need for immunization against influenza 06/13/2018  . 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 Chronic combined systolic and diastolic congestive heart failure ([place] 03/18/2016  . Hypoxia   . Spinal stenosis of lumbar region 12/09/2015  . Restrictive lung disease 06/24/2015  . Diabetic gastroparesis associated with type 2 diabetes mellitus ([place] 07/12/2014  . Depression 05/22/2014  . DMII (diabetes mellitus, type 2) ([place] 04/03/2012  . TOBACCO ABUSE 10/26/2009  . HYPERTHYROIDISM, SUBCLINICAL 05/06/2009  . Essential hypertension 05/25/2007    JOretha Caprice PT 10/14/2019, 12:37 PM  [facility] Amer[address] StreetGWiley Ford N[place] [address]Phone: [phone]  Fax:  [phone] Name: DDENNISSE SWADERMRN: [id number] of Birth: 1November 12, 1952

## 2019-10-21 ENCOUNTER — Ambulatory Visit: Payer: Medicare Other | Admitting: Physical Therapy

## 2019-10-21 ENCOUNTER — Other Ambulatory Visit: Payer: Self-pay

## 2019-10-21 DIAGNOSIS — R600 Localized edema: Secondary | ICD-10-CM

## 2019-10-21 DIAGNOSIS — R2689 Other abnormalities of gait and mobility: Secondary | ICD-10-CM

## 2019-10-21 DIAGNOSIS — M6281 Muscle weakness (generalized): Secondary | ICD-10-CM

## 2019-10-21 DIAGNOSIS — M25671 Stiffness of right ankle, not elsewhere classified: Secondary | ICD-10-CM

## 2019-10-21 DIAGNOSIS — M25571 Pain in right ankle and joints of right foot: Secondary | ICD-10-CM | POA: Diagnosis not present

## 2019-10-21 NOTE — Therapy (Signed)
[place], [place], [address] Phone: [phone]   Fax:  [phone]  Physical Therapy Treatment  Patient Details  Name: [name] MRN: [id number] Date of Birth: 1951-04-05 Referring Provider (PT): Frankey Shown MD   Encounter Date: 10/21/2019  PT End of Session - 10/21/19 0957    Visit Number  2    Number of Visits  13    Date for PT Re-Evaluation  11/25/19    Authorization Type  Public transportation    PT Start Time  0935    PT Stop Time  1025    PT Time Calculation (min)  50 min    Activity Tolerance  Patient tolerated treatment well    Behavior During Therapy  [facility] for tasks assessed/performed       Past Medical History:  Diagnosis Date  . ATN (acute tubular necrosis) ([place]) 07/15/2014  . Automatic implantable cardioverter-defibrillator in situ   . Automatic implantable cardioverter-defibrillator in situ 05/04/2007   Qualifier: Diagnosis of  By: Isla Pence    . Cardiac defibrillator in situ    Atlas II VR (SJM) implanted by Dr Lovena Le  . CHF (congestive heart failure) ([place])   . Chronic combined systolic and diastolic heart failure (HCC)    a. EF 35-40% in past;  b. Echo 7/13:  EF 45-50%, Gr 2 diast dysfn, mild AI, mild MAC, trivial MR, mild LAE, PASP 47.  [name] Chronic ulcer of leg ([place])    04-09-15 resolved-not a problem.  . Colon polyps 04/12/2013   Rectosigmoid polyp  . COPD (chronic obstructive pulmonary disease) (HCC)    O2 at night  . Depression   . Diabetes mellitus   . Diabetes mellitus, type 2 ([place]) 04/03/2012   HIGH RISK FEET.. Please have patient take shoes and socks off every visit for visual foot inspection.    . Eczema   . Elevated alkaline phosphatase level    GGT and 5'nucleotidase 8/13 normal  . Health care maintenance 01/22/2013   Surgically absent cervix- no pap needed (Path report 07/2000: uterine body with attached bilateral adnexa and separate cervix.)  . History of  oxygen administration    oxygen @ 2 l/m nasally bedtime 24/7  . HTN (hypertension)   . Hx of cardiac cath    a. [place] 2003 normal;  b. LHC 6/13:  Mild calcification in the LM, o/w normal coronary arteries, EF 45%.   . Hyperkalemia 08/08/2017  . Hyperlipidemia   . Hyperlipidemia associated with type 2 diabetes mellitus ([place]) 05/25/2007   Qualifier: Diagnosis of  By: Hassell Done FNP, Tori Milks    . Hyperthyroidism, subclinical   . Implantable cardioverter-defibrillator (ICD) generator end of life   . Lipoma   . Need for hepatitis C screening test 04/25/2019  . NICM (nonischemic cardiomyopathy) ([place])   . Obesity   . On home oxygen therapy    "2L; 24/7" (10/11/2016)  . Post menopausal syndrome   . Shortness of breath 07/14/2017  . Skin rash 07/12/2018  . Sleep apnea    pt denies 04/12/2013    Past Surgical History:  Procedure Laterality Date  . ABDOMINAL HYSTERECTOMY    . CARDIAC CATHETERIZATION    . CARDIAC CATHETERIZATION N/A 06/21/2016   Procedure: Left Heart Cath and Coronary Angiography;  Surgeon: Sherren Mocha, MD;  Location: [place] [facility];  Service: Cardiovascular;  Laterality: N/A;  . CARDIAC DEFIBRILLATOR PLACEMENT  05/04/2007   SJM Atlas II VR ICD  . CARDIAC DEFIBRILLATOR PLACEMENT    .  CATARACT EXTRACTION     od  . COLONOSCOPY N/A 04/12/2013   Procedure: COLONOSCOPY;  Surgeon: Beryle Beams, MD;  Location: [facility];  Service: Endoscopy;  Laterality: N/A;  pt.has defibrilator  . COLONOSCOPY N/A 04/16/2015   Procedure: COLONOSCOPY;  Surgeon: [name], MD;  Location: [facility];  Service: Endoscopy;  Laterality: N/A;  . ESOPHAGOGASTRODUODENOSCOPY N/A 04/16/2015   Procedure: ESOPHAGOGASTRODUODENOSCOPY (EGD);  Surgeon: [name], MD;  Location: [facility];  Service: Endoscopy;  Laterality: N/A;  . EYE SURGERY    . HERNIA REPAIR    . HYSTEROSCOPY    . IMPLANTABLE CARDIOVERTER DEFIBRILLATOR (ICD) GENERATOR CHANGE N/A 04/02/2014   Procedure: ICD GENERATOR CHANGE;   Surgeon: Evans Lance, MD;  Location: [facility] CATH LAB;  Service: Cardiovascular;  Laterality: N/A;  . INSERT / REPLACE / REMOVE PACEMAKER    . LEFT HEART CATHETERIZATION WITH CORONARY ANGIOGRAM N/A 04/02/2012   Procedure: LEFT HEART CATHETERIZATION WITH CORONARY ANGIOGRAM;  Surgeon: Hillary Bow, MD;  Location: [facility] CATH LAB;  Service: Cardiovascular;  Laterality: N/A;  . ORIF ANKLE FRACTURE Right 08/12/2019   Procedure: OPEN REDUCTION INTERNAL FIXATION (ORIF) RIGHT TRIMALLEOLAR ANKLE FRACTURE;  Surgeon: Leandrew Koyanagi, MD;  Location: [place];  Service: Orthopedics;  Laterality: Right;  . TONSILLECTOMY    . TUBAL LIGATION      There were no vitals filed for this visit.  Subjective Assessment - 10/21/19 0956    Subjective  Pt arriving to therpay today in Cam walker on R LE. Pt reporting no pain. Pt stated," I've been doing my exercises".    Pertinent History  DM, restrictive lung disease, spinal stenosis, CHF, hypoxia, HTN    How long can you sit comfortably?  unlimited    How long can you stand comfortably?  10 miuntes    How long can you walk comfortably?  5 minutes, "it's hard to walk with boot on"    Diagnostic tests  X-ray    Currently in Pain?  No/denies                       [facility] Adult PT Treatment/Exercise - 10/21/19 0001      Modalities   Modalities  Cryotherapy      Cryotherapy   Number Minutes Cryotherapy  8 Minutes    Cryotherapy Location  Ankle    Type of Cryotherapy  Ice pack      Vasopneumatic   Number Minutes Vasopneumatic   --   unavailable     Manual Therapy   Manual Therapy  Passive ROM    Manual therapy comments  5 minutes    Passive ROM  R ankle all planes      Ankle Exercises: Seated   ABC's  1 rep    Towel Crunch  3 reps    Other Seated Ankle Exercises  Inversion/eversion on towel x 10      Ankle Exercises: Supine   Isometrics  x5 reps holding 5 seconds each (flexion/extension, Inversion/eversion)    T-Band  all directions x 5 reps  using yellow t-band      Ankle Exercises: Standing   Other Standing Ankle Exercises  sit to stand x 10 reps      Ankle Exercises: Stretches   Soleus Stretch  2 reps;30 seconds    Gastroc Stretch  2 reps;30 seconds               PT Short Term Goals - 10/21/19 1002  PT SHORT TERM GOAL #1   Title  Pt will be independent in her HEP.    Time  3    Period  Weeks    Status  On-going    Target Date  11/05/19      PT SHORT TERM GOAL #2   Title  Pt will be able to demonstrate correct gait sequencing with/without LRAD.    Time  3    Period  Weeks    Status  On-going    Target Date  11/05/19        PT Long Term Goals - 10/14/19 1228      PT LONG TERM GOAL #1   Title  Pt will be able to amb >/= 500 feet on community level surfaces with no device with normalized gait pattern.    Baseline  amb with cam walker using a straight cane with antalgic gait pattern    Time  6    Period  Weeks    Status  New    Target Date  11/25/19      PT LONG TERM GOAL #2   Title  Pt will improve her R knee strength to >/= 4+/5 in order to improve functional mobility.    Baseline  see flow sheets    Time  6    Period  Weeks    Status  New    Target Date  11/25/19      PT LONG TERM GOAL #3   Title  Pt will be able to navigate 5 steps using a single hand rail without difficutly using step over step pattern.    Baseline  step to gait pattern using assistive device and hand rail.    Time  6    Period  Weeks    Status  New    Target Date  11/25/19      PT LONG TERM GOAL #4   Title  Pt will improve her DF AROM to >/= 10 degrees bilaterally to increase her heel strike during gait.    Baseline  see flow sheets    Time  6    Period  Weeks    Status  New    Target Date  11/25/19            Plan - 10/21/19 0959    Clinical Impression Statement  Pt tolreating treatment well today reporting no pain. Pt amb into clinic with a Cam walker on R LE. Pt with incresaed swelling noted. Pt  having difficulty with marble pick up and reported fatigue with strengthening exrecises. Pt requiring rest breaks throughout session. Continue skilled therapy progressing toward goals set.    Personal Factors and Comorbidities  Comorbidity 3+    Comorbidities  COPD, CHF,HTN depression, hypoxia, spinal stenosis    Examination-Activity Limitations  Lift;Stand;Transfers;Carry;Dressing;Stairs;Squat    Examination-Participation Restrictions  Community Activity;Other;Laundry    Stability/Clinical Decision Making  Stable/Uncomplicated    Rehab Potential  Good    PT Frequency  2x / week    PT Duration  6 weeks    PT Treatment/Interventions  ADLs/Self Care Home Management;Cryotherapy;Electrical Stimulation;Iontophoresis 4m/ml Dexamethasone;Ultrasound;Gait training;Stair training;Functional mobility training;Therapeutic activities;Therapeutic exercise;Balance training;Neuromuscular re-education;Patient/family education;Manual techniques;Vasopneumatic Device;Taping;Passive range of motion    PT Next Visit Plan  vasopneumatic, ankle ROM, strengthening, gait training    PT Home Exercise Plan  Access Code: [email or web address] (ABC's, inversion/eversion with towel, towel scrunches, DF stretch seated)    Consulted and Agree with Plan of Care  Patient  Patient will benefit from skilled therapeutic intervention in order to improve the following deficits and impairments:  Obesity, Difficulty walking, Increased edema, Decreased strength, Decreased range of motion, Decreased activity tolerance, Postural dysfunction, Pain, Abnormal gait  Visit Diagnosis: Pain in right ankle and joints of right foot  Stiffness of right ankle, not elsewhere classified  Muscle weakness (generalized)  Other abnormalities of gait and mobility  Localized edema     Problem List Patient Active Problem List   Diagnosis Date Noted  . Bimalleolar ankle fracture, right, closed, initial encounter   . Trapezius muscle strain 06/19/2019   . Skin rash 07/12/2018  . Need for immunization against influenza 06/13/2018  . 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 Chronic combined systolic and diastolic congestive heart failure ([place]) 03/18/2016  . Hypoxia   . Spinal stenosis of lumbar region 12/09/2015  . Restrictive lung disease 06/24/2015  . Diabetic gastroparesis associated with type 2 diabetes mellitus ([place]) 07/12/2014  . Depression 05/22/2014  . DMII (diabetes mellitus, type 2) ([place]) 04/03/2012  . TOBACCO ABUSE 10/26/2009  . HYPERTHYROIDISM, SUBCLINICAL 05/06/2009  . Essential hypertension 05/25/2007    Oretha Caprice, PT 10/21/2019, 10:22 AM  [facility] [address] [place], [place], [address] Phone: [phone]   Fax:  [phone]  Name: [name] MRN: [id number] Date of Birth: Feb 27, 1951

## 2019-10-24 ENCOUNTER — Ambulatory Visit: Payer: Medicare Other | Admitting: Physical Therapy

## 2019-10-24 ENCOUNTER — Other Ambulatory Visit: Payer: Self-pay

## 2019-10-24 ENCOUNTER — Encounter: Payer: Self-pay | Admitting: Physical Therapy

## 2019-10-24 DIAGNOSIS — M25671 Stiffness of right ankle, not elsewhere classified: Secondary | ICD-10-CM | POA: Diagnosis not present

## 2019-10-24 DIAGNOSIS — R2689 Other abnormalities of gait and mobility: Secondary | ICD-10-CM

## 2019-10-24 DIAGNOSIS — M25571 Pain in right ankle and joints of right foot: Secondary | ICD-10-CM

## 2019-10-24 DIAGNOSIS — R600 Localized edema: Secondary | ICD-10-CM

## 2019-10-24 DIAGNOSIS — M6281 Muscle weakness (generalized): Secondary | ICD-10-CM

## 2019-10-24 NOTE — Therapy (Signed)
[place], [place], [address] Phone: [phone]   Fax:  [phone]  Physical Therapy Treatment  Patient Details  Name: [name] MRN: [id number] Date of Birth: 02-01-1951 Referring Provider (PT): Frankey Shown MD   Encounter Date: 10/24/2019  PT End of Session - 10/24/19 1112    Visit Number  3    Number of Visits  13    Date for PT Re-Evaluation  11/25/19    PT Start Time  1100    PT Stop Time  1142    PT Time Calculation (min)  42 min       Past Medical History:  Diagnosis Date  . ATN (acute tubular necrosis) ([place]) 07/15/2014  . Automatic implantable cardioverter-defibrillator in situ   . Automatic implantable cardioverter-defibrillator in situ 05/04/2007   Qualifier: Diagnosis of  By: Isla Pence    . Cardiac defibrillator in situ    Atlas II VR (SJM) implanted by Dr Lovena Le  . CHF (congestive heart failure) ([place])   . Chronic combined systolic and diastolic heart failure (HCC)    a. EF 35-40% in past;  b. Echo 7/13:  EF 45-50%, Gr 2 diast dysfn, mild AI, mild MAC, trivial MR, mild LAE, PASP 47.  [name] Chronic ulcer of leg ([place])    04-09-15 resolved-not a problem.  . Colon polyps 04/12/2013   Rectosigmoid polyp  . COPD (chronic obstructive pulmonary disease) (HCC)    O2 at night  . Depression   . Diabetes mellitus   . Diabetes mellitus, type 2 ([place]) 04/03/2012   HIGH RISK FEET.. Please have patient take shoes and socks off every visit for visual foot inspection.    . Eczema   . Elevated alkaline phosphatase level    GGT and 5'nucleotidase 8/13 normal  . Health care maintenance 01/22/2013   Surgically absent cervix- no pap needed (Path report 07/2000: uterine body with attached bilateral adnexa and separate cervix.)  . History of oxygen administration    oxygen @ 2 l/m nasally bedtime 24/7  . HTN (hypertension)   . Hx of cardiac cath    a. [place] 2003 normal;  b. LHC 6/13:  Mild calcification  in the LM, o/w normal coronary arteries, EF 45%.   . Hyperkalemia 08/08/2017  . Hyperlipidemia   . Hyperlipidemia associated with type 2 diabetes mellitus ([place]) 05/25/2007   Qualifier: Diagnosis of  By: Hassell Done FNP, Tori Milks    . Hyperthyroidism, subclinical   . Implantable cardioverter-defibrillator (ICD) generator end of life   . Lipoma   . Need for hepatitis C screening test 04/25/2019  . NICM (nonischemic cardiomyopathy) ([place])   . Obesity   . On home oxygen therapy    "2L; 24/7" (10/11/2016)  . Post menopausal syndrome   . Shortness of breath 07/14/2017  . Skin rash 07/12/2018  . Sleep apnea    pt denies 04/12/2013    Past Surgical History:  Procedure Laterality Date  . ABDOMINAL HYSTERECTOMY    . CARDIAC CATHETERIZATION    . CARDIAC CATHETERIZATION N/A 06/21/2016   Procedure: Left Heart Cath and Coronary Angiography;  Surgeon: Sherren Mocha, MD;  Location: [facility];  Service: Cardiovascular;  Laterality: N/A;  . CARDIAC DEFIBRILLATOR PLACEMENT  05/04/2007   SJM Atlas II VR ICD  . CARDIAC DEFIBRILLATOR PLACEMENT    . CATARACT EXTRACTION     od  . COLONOSCOPY N/A 04/12/2013   Procedure: COLONOSCOPY;  Surgeon: Beryle Beams, MD;  Location: [facility];  Service: Endoscopy;  Laterality: N/A;  pt.has defibrilator  . COLONOSCOPY N/A 04/16/2015   Procedure: COLONOSCOPY;  Surgeon: [name], MD;  Location: [facility];  Service: Endoscopy;  Laterality: N/A;  . ESOPHAGOGASTRODUODENOSCOPY N/A 04/16/2015   Procedure: ESOPHAGOGASTRODUODENOSCOPY (EGD);  Surgeon: [name], MD;  Location: [facility];  Service: Endoscopy;  Laterality: N/A;  . EYE SURGERY    . HERNIA REPAIR    . HYSTEROSCOPY    . IMPLANTABLE CARDIOVERTER DEFIBRILLATOR (ICD) GENERATOR CHANGE N/A 04/02/2014   Procedure: ICD GENERATOR CHANGE;  Surgeon: Evans Lance, MD;  Location: [facility] CATH LAB;  Service: Cardiovascular;  Laterality: N/A;  . INSERT / REPLACE / REMOVE PACEMAKER    . LEFT HEART  CATHETERIZATION WITH CORONARY ANGIOGRAM N/A 04/02/2012   Procedure: LEFT HEART CATHETERIZATION WITH CORONARY ANGIOGRAM;  Surgeon: Hillary Bow, MD;  Location: [facility] CATH LAB;  Service: Cardiovascular;  Laterality: N/A;  . ORIF ANKLE FRACTURE Right 08/12/2019   Procedure: OPEN REDUCTION INTERNAL FIXATION (ORIF) RIGHT TRIMALLEOLAR ANKLE FRACTURE;  Surgeon: Leandrew Koyanagi, MD;  Location: [facility];  Service: Orthopedics;  Laterality: Right;  . TONSILLECTOMY    . TUBAL LIGATION      There were no vitals filed for this visit.                    [place] Adult PT Treatment/Exercise - 10/24/19 0001      Knee/Hip Exercises: Supine   Straight Leg Raises  10 reps    Other Supine Knee/Hip Exercises  sidelying hip abduction x 10       Vasopneumatic   Number Minutes Vasopneumatic   10 minutes    Vasopnuematic Location   Ankle    Vasopneumatic Pressure  Medium    Vasopneumatic Temperature   34      Manual Therapy   Passive ROM  R ankle all planes      Ankle Exercises: Seated   ABC's  1 rep   need cues to use ankle   Towel Crunch  5 reps    Other Seated Ankle Exercises  Inversion/eversion on towel x 10      Ankle Exercises: Supine   Isometrics  x5 reps holding 5 seconds each (flexion/extension, Inversion/eversion)    T-Band  all directions x 5 reps using red band       Ankle Exercises: Stretches   Soleus Stretch  2 reps;30 seconds    Gastroc Stretch  2 reps;30 seconds               PT Short Term Goals - 10/21/19 1002      PT SHORT TERM GOAL #1   Title  Pt will be independent in her HEP.    Time  3    Period  Weeks    Status  On-going    Target Date  11/05/19      PT SHORT TERM GOAL #2   Title  Pt will be able to demonstrate correct gait sequencing with/without LRAD.    Time  3    Period  Weeks    Status  On-going    Target Date  11/05/19        PT Long Term Goals - 10/14/19 1228      PT LONG TERM GOAL #1   Title  Pt will be able to amb >/= 500 feet on  community level surfaces with no device with normalized gait pattern.    Baseline  amb with cam walker  using a straight cane with antalgic gait pattern    Time  6    Period  Weeks    Status  New    Target Date  11/25/19      PT LONG TERM GOAL #2   Title  Pt will improve her R knee strength to >/= 4+/5 in order to improve functional mobility.    Baseline  see flow sheets    Time  6    Period  Weeks    Status  New    Target Date  11/25/19      PT LONG TERM GOAL #3   Title  Pt will be able to navigate 5 steps using a single hand rail without difficutly using step over step pattern.    Baseline  step to gait pattern using assistive device and hand rail.    Time  6    Period  Weeks    Status  New    Target Date  11/25/19      PT LONG TERM GOAL #4   Title  Pt will improve her DF AROM to >/= 10 degrees bilaterally to increase her heel strike during gait.    Baseline  see flow sheets    Time  6    Period  Weeks    Status  New    Target Date  11/25/19            Plan - 10/24/19 1143    Clinical Impression Statement  Pt arrives in CAM boot and without pain. Able to progress with hip strength and tried Vaso for edema. Progressed to red band for ankle theraband.  No inreased pain.    PT Next Visit Plan  vasopneumatic, ankle ROM, strengthening, gait training, add 4 way ankle to HEP    PT Home Exercise Plan  Access Code: [email or web address] (ABC's, inversion/eversion with towel, towel scrunches, DF stretch seated)       Patient will benefit from skilled therapeutic intervention in order to improve the following deficits and impairments:  Obesity, Difficulty walking, Increased edema, Decreased strength, Decreased range of motion, Decreased activity tolerance, Postural dysfunction, Pain, Abnormal gait  Visit Diagnosis: Pain in right ankle and joints of right foot  Stiffness of right ankle, not elsewhere classified  Muscle weakness (generalized)  Localized edema  Other abnormalities of  gait and mobility     Problem List Patient Active Problem List   Diagnosis Date Noted  . Bimalleolar ankle fracture, right, closed, initial encounter   . Trapezius muscle strain 06/19/2019  . Skin rash 07/12/2018  . Need for immunization against influenza 06/13/2018  . 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 Chronic combined systolic and diastolic congestive heart failure ([place]) 03/18/2016  . Hypoxia   . Spinal stenosis of lumbar region 12/09/2015  . Restrictive lung disease 06/24/2015  . Diabetic gastroparesis associated with type 2 diabetes mellitus ([place]) 07/12/2014  . Depression 05/22/2014  . DMII (diabetes mellitus, type 2) ([place]) 04/03/2012  . TOBACCO ABUSE 10/26/2009  . HYPERTHYROIDISM, SUBCLINICAL 05/06/2009  . Essential hypertension 05/25/2007    Dorene Ar, PTA 10/24/2019, 11:44 AM  [facility] [address] [place], [place], [address] Phone: [phone]   Fax:  [phone]  Name: [name] MRN: [id number] Date of Birth: [birth year]

## 2019-10-26 ENCOUNTER — Other Ambulatory Visit: Payer: Self-pay | Admitting: Internal Medicine

## 2019-10-28 ENCOUNTER — Other Ambulatory Visit: Payer: Self-pay

## 2019-10-28 ENCOUNTER — Ambulatory Visit: Payer: Medicare Other | Admitting: Physical Therapy

## 2019-10-28 ENCOUNTER — Encounter: Payer: Self-pay | Admitting: Physical Therapy

## 2019-10-28 DIAGNOSIS — M25571 Pain in right ankle and joints of right foot: Secondary | ICD-10-CM

## 2019-10-28 DIAGNOSIS — M25671 Stiffness of right ankle, not elsewhere classified: Secondary | ICD-10-CM

## 2019-10-28 DIAGNOSIS — M6281 Muscle weakness (generalized): Secondary | ICD-10-CM

## 2019-10-28 DIAGNOSIS — R600 Localized edema: Secondary | ICD-10-CM

## 2019-10-28 DIAGNOSIS — R2689 Other abnormalities of gait and mobility: Secondary | ICD-10-CM

## 2019-10-28 NOTE — Therapy (Signed)
[place], [place], [address] Phone: [phone]   Fax:  [phone]  Physical Therapy Treatment  Patient Details  Name: [name] MRN: [id number] Date of Birth: 08-26-1951 Referring Provider (PT): Frankey Shown MD   Encounter Date: 10/28/2019  PT End of Session - 10/28/19 1058    Visit Number  4    Number of Visits  13    Date for PT Re-Evaluation  11/25/19    Authorization Type  Public transportation    PT Start Time  1045    PT Stop Time  1135    PT Time Calculation (min)  50 min    Activity Tolerance  Patient tolerated treatment well    Behavior During Therapy  [facility] for tasks assessed/performed       Past Medical History:  Diagnosis Date  . ATN (acute tubular necrosis) ([place]) 07/15/2014  . Automatic implantable cardioverter-defibrillator in situ   . Automatic implantable cardioverter-defibrillator in situ 05/04/2007   Qualifier: Diagnosis of  By: Isla Pence    . Cardiac defibrillator in situ    Atlas II VR (SJM) implanted by Dr Lovena Le  . CHF (congestive heart failure) ([place])   . Chronic combined systolic and diastolic heart failure (HCC)    a. EF 35-40% in past;  b. Echo 7/13:  EF 45-50%, Gr 2 diast dysfn, mild AI, mild MAC, trivial MR, mild LAE, PASP 47.  [name] Chronic ulcer of leg ([place])    04-09-15 resolved-not a problem.  . Colon polyps 04/12/2013   Rectosigmoid polyp  . COPD (chronic obstructive pulmonary disease) (HCC)    O2 at night  . Depression   . Diabetes mellitus   . Diabetes mellitus, type 2 ([place]) 04/03/2012   HIGH RISK FEET.. Please have patient take shoes and socks off every visit for visual foot inspection.    . Eczema   . Elevated alkaline phosphatase level    GGT and 5'nucleotidase 8/13 normal  . Health care maintenance 01/22/2013   Surgically absent cervix- no pap needed (Path report 07/2000: uterine body with attached bilateral adnexa and separate cervix.)  . History of  oxygen administration    oxygen @ 2 l/m nasally bedtime 24/7  . HTN (hypertension)   . Hx of cardiac cath    a. [place] 2003 normal;  b. LHC 6/13:  Mild calcification in the LM, o/w normal coronary arteries, EF 45%.   . Hyperkalemia 08/08/2017  . Hyperlipidemia   . Hyperlipidemia associated with type 2 diabetes mellitus ([place]) 05/25/2007   Qualifier: Diagnosis of  By: Hassell Done FNP, Tori Milks    . Hyperthyroidism, subclinical   . Implantable cardioverter-defibrillator (ICD) generator end of life   . Lipoma   . Need for hepatitis C screening test 04/25/2019  . NICM (nonischemic cardiomyopathy) ([place])   . Obesity   . On home oxygen therapy    "2L; 24/7" (10/11/2016)  . Post menopausal syndrome   . Shortness of breath 07/14/2017  . Skin rash 07/12/2018  . Sleep apnea    pt denies 04/12/2013    Past Surgical History:  Procedure Laterality Date  . ABDOMINAL HYSTERECTOMY    . CARDIAC CATHETERIZATION    . CARDIAC CATHETERIZATION N/A 06/21/2016   Procedure: Left Heart Cath and Coronary Angiography;  Surgeon: Sherren Mocha, MD;  Location: [facility];  Service: Cardiovascular;  Laterality: N/A;  . CARDIAC DEFIBRILLATOR PLACEMENT  05/04/2007   SJM Atlas II VR ICD  . CARDIAC DEFIBRILLATOR PLACEMENT    .  CATARACT EXTRACTION     od  . COLONOSCOPY N/A 04/12/2013   Procedure: COLONOSCOPY;  Surgeon: Beryle Beams, MD;  Location: [facility];  Service: Endoscopy;  Laterality: N/A;  pt.has defibrilator  . COLONOSCOPY N/A 04/16/2015   Procedure: COLONOSCOPY;  Surgeon: [name], MD;  Location: [facility];  Service: Endoscopy;  Laterality: N/A;  . ESOPHAGOGASTRODUODENOSCOPY N/A 04/16/2015   Procedure: ESOPHAGOGASTRODUODENOSCOPY (EGD);  Surgeon: [name], MD;  Location: [facility];  Service: Endoscopy;  Laterality: N/A;  . EYE SURGERY    . HERNIA REPAIR    . HYSTEROSCOPY    . IMPLANTABLE CARDIOVERTER DEFIBRILLATOR (ICD) GENERATOR CHANGE N/A 04/02/2014   Procedure: ICD GENERATOR CHANGE;   Surgeon: Evans Lance, MD;  Location: [facility] CATH LAB;  Service: Cardiovascular;  Laterality: N/A;  . INSERT / REPLACE / REMOVE PACEMAKER    . LEFT HEART CATHETERIZATION WITH CORONARY ANGIOGRAM N/A 04/02/2012   Procedure: LEFT HEART CATHETERIZATION WITH CORONARY ANGIOGRAM;  Surgeon: Hillary Bow, MD;  [facility];  Service: Cardiovascular;  Laterality: N/A;  . ORIF ANKLE FRACTURE Right 08/12/2019   Procedure: OPEN REDUCTION INTERNAL FIXATION (ORIF) RIGHT TRIMALLEOLAR ANKLE FRACTURE;  Surgeon: Leandrew Koyanagi, MD;  Location: [facility];  Service: Orthopedics;  Laterality: Right;  . TONSILLECTOMY    . TUBAL LIGATION      There were no vitals filed for this visit.  Subjective Assessment - 10/28/19 1053    Subjective  Pt arriving to therapy today in CAM walker on R LE. Pt reporting no pain.    Pertinent History  DM, restrictive lung disease, spinal stenosis, CHF, hypoxia, HTN    How long can you sit comfortably?  unlimited    How long can you stand comfortably?  10 miuntes    How long can you walk comfortably?  5 minutes, "it's hard to walk with boot on"    Diagnostic tests  X-ray    Patient Stated Goals  Walk without the boot, get back to normal    Currently in Pain?  No/denies                       [facility] Adult PT Treatment/Exercise - 10/28/19 0001      Knee/Hip Exercises: Supine   Straight Leg Raises  10 reps    Other Supine Knee/Hip Exercises  sidelying hip abduction x 10       Vasopneumatic   Number Minutes Vasopneumatic   10 minutes    Vasopnuematic Location   Ankle    Vasopneumatic Pressure  Medium    Vasopneumatic Temperature   34      Manual Therapy   Manual Therapy  Passive ROM    Manual therapy comments  8 minutes    Passive ROM  R ankle, all planes      Ankle Exercises: Seated   ABC's  1 rep   need cues to use ankle   Towel Crunch  5 reps    Other Seated Ankle Exercises  Inversion/eversion on towel x 10      Ankle Exercises: Supine   Isometrics   10 x 2  holding 3 seconds each (flexion/extension, Inversion/eversion)    T-Band  all directions x 5 reps using red band       Ankle Exercises: Stretches   Soleus Stretch  2 reps;30 seconds    Gastroc Stretch  2 reps;30 seconds    Other Stretch  seated hamstring strech using strap x 3 reps  holding 30 seconds each      Ankle Exercises: Standing   Rocker Board  2 minutes   seated              PT Short Term Goals - 10/28/19 1102      PT SHORT TERM GOAL #1   Title  Pt will be independent in her HEP.    Time  3    Period  Weeks    Status  On-going    Target Date  11/05/19      PT SHORT TERM GOAL #2   Title  Pt will be able to demonstrate correct gait sequencing with/without LRAD.    Time  3    Period  Weeks    Status  On-going    Target Date  11/05/19        PT Long Term Goals - 10/14/19 1228      PT LONG TERM GOAL #1   Title  Pt will be able to amb >/= 500 feet on community level surfaces with no device with normalized gait pattern.    Baseline  amb with cam walker using a straight cane with antalgic gait pattern    Time  6    Period  Weeks    Status  New    Target Date  11/25/19      PT LONG TERM GOAL #2   Title  Pt will improve her R knee strength to >/= 4+/5 in order to improve functional mobility.    Baseline  see flow sheets    Time  6    Period  Weeks    Status  New    Target Date  11/25/19      PT LONG TERM GOAL #3   Title  Pt will be able to navigate 5 steps using a single hand rail without difficutly using step over step pattern.    Baseline  step to gait pattern using assistive device and hand rail.    Time  6    Period  Weeks    Status  New    Target Date  11/25/19      PT LONG TERM GOAL #4   Title  Pt will improve her DF AROM to >/= 10 degrees bilaterally to increase her heel strike during gait.    Baseline  see flow sheets    Time  6    Period  Weeks    Status  New    Target Date  11/25/19            Plan - 10/28/19 1100     Clinical Impression Statement  Pt arriving in CAM walker with no pain today. Pt tolerating exercises well with gentle strengthening and stretching. Pt reporting feeling better following therapy session and reports there is less stiffness noted. Continue skilled PT, no goals met this session.    Comorbidities  COPD, CHF,HTN depression, hypoxia, spinal stenosis    Examination-Activity Limitations  Lift;Stand;Transfers;Carry;Dressing;Stairs;Squat    Examination-Participation Restrictions  Community Activity;Other;Laundry    Stability/Clinical Decision Making  Stable/Uncomplicated    Rehab Potential  Good    PT Frequency  2x / week    PT Duration  6 weeks    PT Treatment/Interventions  ADLs/Self Care Home Management;Cryotherapy;Electrical Stimulation;Iontophoresis 52m/ml Dexamethasone;Ultrasound;Gait training;Stair training;Functional mobility training;Therapeutic activities;Therapeutic exercise;Balance training;Neuromuscular re-education;Patient/family education;Manual techniques;Vasopneumatic Device;Taping;Passive range of motion    PT Next Visit Plan  vasopneumatic, ankle ROM, strengthening, gait training, add 4 way ankle to HEP, BAPS board  PT Home Exercise Plan  Access Code: [email or web address] (ABC's, inversion/eversion with towel, towel scrunches, DF stretch seated)    Consulted and Agree with Plan of Care  --       Patient will benefit from skilled therapeutic intervention in order to improve the following deficits and impairments:  Obesity, Difficulty walking, Increased edema, Decreased strength, Decreased range of motion, Decreased activity tolerance, Postural dysfunction, Pain, Abnormal gait  Visit Diagnosis: Pain in right ankle and joints of right foot  Stiffness of right ankle, not elsewhere classified  Muscle weakness (generalized)  Localized edema  Other abnormalities of gait and mobility     Problem List Patient Active Problem List   Diagnosis Date Noted  . Bimalleolar ankle  fracture, right, closed, initial encounter   . Trapezius muscle strain 06/19/2019  . Skin rash 07/12/2018  . Need for immunization against influenza 06/13/2018  . 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 Chronic combined systolic and diastolic congestive heart failure ([place]) 03/18/2016  . Hypoxia   . Spinal stenosis of lumbar region 12/09/2015  . Restrictive lung disease 06/24/2015  . Diabetic gastroparesis associated with type 2 diabetes mellitus ([place]) 07/12/2014  . Depression 05/22/2014  . DMII (diabetes mellitus, type 2) ([place]) 04/03/2012  . TOBACCO ABUSE 10/26/2009  . HYPERTHYROIDISM, SUBCLINICAL 05/06/2009  . Essential hypertension 05/25/2007    Oretha Caprice, PT 10/28/2019, 11:25 AM  [facility] [address] [place], [place], [address] Phone: [phone]   Fax:  [phone]  Name: [name] MRN: [id number] Date of Birth: [birth year]

## 2019-10-30 ENCOUNTER — Encounter: Payer: Self-pay | Admitting: Physical Therapy

## 2019-10-30 ENCOUNTER — Other Ambulatory Visit: Payer: Self-pay

## 2019-10-30 ENCOUNTER — Ambulatory Visit: Payer: Medicare Other | Admitting: Physical Therapy

## 2019-10-30 DIAGNOSIS — R2689 Other abnormalities of gait and mobility: Secondary | ICD-10-CM | POA: Diagnosis not present

## 2019-10-30 DIAGNOSIS — R600 Localized edema: Secondary | ICD-10-CM | POA: Diagnosis not present

## 2019-10-30 DIAGNOSIS — M6281 Muscle weakness (generalized): Secondary | ICD-10-CM

## 2019-10-30 DIAGNOSIS — I509 Heart failure, unspecified: Secondary | ICD-10-CM | POA: Diagnosis not present

## 2019-10-30 DIAGNOSIS — J449 Chronic obstructive pulmonary disease, unspecified: Secondary | ICD-10-CM | POA: Diagnosis not present

## 2019-10-30 DIAGNOSIS — M25571 Pain in right ankle and joints of right foot: Secondary | ICD-10-CM

## 2019-10-30 DIAGNOSIS — M25671 Stiffness of right ankle, not elsewhere classified: Secondary | ICD-10-CM

## 2019-10-30 NOTE — Therapy (Signed)
[place], [place], [address] Phone: [phone]   Fax:  [phone]  Physical Therapy Treatment  Patient Details  Name: IZZIE [name] MRN: [id number] Date of Birth: 1951/05/25 Referring Provider (PT): Frankey Shown MD   Encounter Date: 10/30/2019  PT End of Session - 10/30/19 0934    Visit Number  5    Number of Visits  13    Date for PT Re-Evaluation  11/25/19    Authorization Type  UHC/Medicaid    PT Start Time  0930    PT Stop Time  8099    PT Time Calculation (min)  53 min       Past Medical History:  Diagnosis Date  . ATN (acute tubular necrosis) ([place]) 07/15/2014  . Automatic implantable cardioverter-defibrillator in situ   . Automatic implantable cardioverter-defibrillator in situ 05/04/2007   Qualifier: Diagnosis of  By: Isla Pence    . Cardiac defibrillator in situ    Atlas II VR (SJM) implanted by Dr Lovena Le  . CHF (congestive heart failure) ([place])   . Chronic combined systolic and diastolic heart failure (HCC)    a. EF 35-40% in past;  b. Echo 7/13:  EF 45-50%, Gr 2 diast dysfn, mild AI, mild MAC, trivial MR, mild LAE, PASP 47.  [name] Chronic ulcer of leg ([place])    04-09-15 resolved-not a problem.  . Colon polyps 04/12/2013   Rectosigmoid polyp  . COPD (chronic obstructive pulmonary disease) (HCC)    O2 at night  . Depression   . Diabetes mellitus   . Diabetes mellitus, type 2 ([place]) 04/03/2012   HIGH RISK FEET.. Please have patient take shoes and socks off every visit for visual foot inspection.    . Eczema   . Elevated alkaline phosphatase level    GGT and 5'nucleotidase 8/13 normal  . Health care maintenance 01/22/2013   Surgically absent cervix- no pap needed (Path report 07/2000: uterine body with attached bilateral adnexa and separate cervix.)  . History of oxygen administration    oxygen @ 2 l/m nasally bedtime 24/7  . HTN (hypertension)   . Hx of cardiac cath    a. [place] 2003  normal;  b. LHC 6/13:  Mild calcification in the LM, o/w normal coronary arteries, EF 45%.   . Hyperkalemia 08/08/2017  . Hyperlipidemia   . Hyperlipidemia associated with type 2 diabetes mellitus ([place]) 05/25/2007   Qualifier: Diagnosis of  By: Hassell Done FNP, Tori Milks    . Hyperthyroidism, subclinical   . Implantable cardioverter-defibrillator (ICD) generator end of life   . Lipoma   . Need for hepatitis C screening test 04/25/2019  . NICM (nonischemic cardiomyopathy) ([place])   . Obesity   . On home oxygen therapy    "2L; 24/7" (10/11/2016)  . Post menopausal syndrome   . Shortness of breath 07/14/2017  . Skin rash 07/12/2018  . Sleep apnea    pt denies 04/12/2013    Past Surgical History:  Procedure Laterality Date  . ABDOMINAL HYSTERECTOMY    . CARDIAC CATHETERIZATION    . CARDIAC CATHETERIZATION N/A 06/21/2016   Procedure: Left Heart Cath and Coronary Angiography;  Surgeon: Sherren Mocha, MD;  Location: [facility];  Service: Cardiovascular;  Laterality: N/A;  . CARDIAC DEFIBRILLATOR PLACEMENT  05/04/2007   SJM Atlas II VR ICD  . CARDIAC DEFIBRILLATOR PLACEMENT    . CATARACT EXTRACTION     od  . COLONOSCOPY N/A 04/12/2013   Procedure: COLONOSCOPY;  Surgeon:  Beryle Beams, MD;  Location: [facility];  Service: Endoscopy;  Laterality: N/A;  pt.has defibrilator  . COLONOSCOPY N/A 04/16/2015   Procedure: COLONOSCOPY;  Surgeon: [name], MD;  [facility];  Service: Endoscopy;  Laterality: N/A;  . ESOPHAGOGASTRODUODENOSCOPY N/A 04/16/2015   Procedure: ESOPHAGOGASTRODUODENOSCOPY (EGD);  Surgeon: [name], MD;  Location: [facility];  Service: Endoscopy;  Laterality: N/A;  . EYE SURGERY    . HERNIA REPAIR    . HYSTEROSCOPY    . IMPLANTABLE CARDIOVERTER DEFIBRILLATOR (ICD) GENERATOR CHANGE N/A 04/02/2014   Procedure: ICD GENERATOR CHANGE;  Surgeon: Evans Lance, MD;  Location: [facility] CATH LAB;  Service: Cardiovascular;  Laterality: N/A;  . INSERT / REPLACE /  REMOVE PACEMAKER    . LEFT HEART CATHETERIZATION WITH CORONARY ANGIOGRAM N/A 04/02/2012   Procedure: LEFT HEART CATHETERIZATION WITH CORONARY ANGIOGRAM;  Surgeon: Hillary Bow, MD;  Location: [facility] C[facility] LAB;  Service: Cardiovascular;  Laterality: N/A;  . ORIF ANKLE FRACTURE Right 08/12/2019   Procedure: OPEN REDUCTION INTERNAL FIXATION (ORIF) RIGHT TRIMALLEOLAR ANKLE FRACTURE;  Surgeon: Leandrew Koyanagi, MD;  Location: [facility];  Service: Orthopedics;  Laterality: Right;  . TONSILLECTOMY    . TUBAL LIGATION      There were no vitals filed for this visit.  Subjective Assessment - 10/30/19 0932    Subjective  No pain. See MD on Tuesday for follow up.    Currently in Pain?  No/denies                       [facility] Adult PT Treatment/Exercise - 10/30/19 0001      Knee/Hip Exercises: Supine   Straight Leg Raises  15 reps    Other Supine Knee/Hip Exercises  sidelying hip abduction x 15      Vasopneumatic   Number Minutes Vasopneumatic   10 minutes    Vasopnuematic Location   Ankle    Vasopneumatic Pressure  Medium    Vasopneumatic Temperature   34      Manual Therapy   Passive ROM  R ankle, all planes      Ankle Exercises: Stretches   Soleus Stretch  2 reps;30 seconds    Gastroc Stretch  2 reps;30 seconds    Other Stretch  seated hamstring strech using strap x 3 reps holding 30 seconds each      Ankle Exercises: Standing   Rocker Board  --      Ankle Exercises: Supine   T-Band  all directions x 5 reps using red band       Ankle Exercises: Seated   Towel Crunch  5 reps    Other Seated Ankle Exercises  Inversion/eversion on towel x 10               PT Short Term Goals - 10/28/19 1102      PT SHORT TERM GOAL #1   Title  Pt will be independent in her HEP.    Time  3    Period  Weeks    Status  On-going    Target Date  11/05/19      PT SHORT TERM GOAL #2   Title  Pt will be able to demonstrate correct gait sequencing with/without LRAD.    Time  3     Period  Weeks    Status  On-going    Target Date  11/05/19        PT Long Term Goals - 10/14/19 1228  PT LONG TERM GOAL #1   Title  Pt will be able to amb >/= 500 feet on community level surfaces with no device with normalized gait pattern.    Baseline  amb with cam walker using a straight cane with antalgic gait pattern    Time  6    Period  Weeks    Status  New    Target Date  11/25/19      PT LONG TERM GOAL #2   Title  Pt will improve her R knee strength to >/= 4+/5 in order to improve functional mobility.    Baseline  see flow sheets    Time  6    Period  Weeks    Status  New    Target Date  11/25/19      PT LONG TERM GOAL #3   Title  Pt will be able to navigate 5 steps using a single hand rail without difficutly using step over step pattern.    Baseline  step to gait pattern using assistive device and hand rail.    Time  6    Period  Weeks    Status  New    Target Date  11/25/19      PT LONG TERM GOAL #4   Title  Pt will improve her DF AROM to >/= 10 degrees bilaterally to increase her heel strike during gait.    Baseline  see flow sheets    Time  6    Period  Weeks    Status  New    Target Date  11/25/19            Plan - 10/30/19 1012    Clinical Impression Statement  Pt continues with no pain. She is walking in her home without CAM boot. Continued with gentle stretngth and stretching. Fatigues with Hip strengthening.    PT Next Visit Plan  vasopneumatic, ankle ROM, strengthening, gait training, add 4 way ankle to HEP, BAPS board, add knee strength, MD appt Tuesday Feb 2    PT Home Exercise Plan  Access Code: [email or web address] (ABC's, inversion/eversion with towel, towel scrunches, DF stretch seated)       Patient will benefit from skilled therapeutic intervention in order to improve the following deficits and impairments:     Visit Diagnosis: Pain in right ankle and joints of right foot  Stiffness of right ankle, not elsewhere classified  Muscle  weakness (generalized)  Localized edema  Other abnormalities of gait and mobility     Problem List Patient Active Problem List   Diagnosis Date Noted  . Bimalleolar ankle fracture, right, closed, initial encounter   . Trapezius muscle strain 06/19/2019  . Skin rash 07/12/2018  . Need for immunization against influenza 06/13/2018  . 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 Chronic combined systolic and diastolic congestive heart failure ([place]) 03/18/2016  . Hypoxia   . Spinal stenosis of lumbar region 12/09/2015  . Restrictive lung disease 06/24/2015  . Diabetic gastroparesis associated with type 2 diabetes mellitus ([place]) 07/12/2014  . Depression 05/22/2014  . DMII (diabetes mellitus, type 2) ([place]) 04/03/2012  . TOBACCO ABUSE 10/26/2009  . HYPERTHYROIDISM, SUBCLINICAL 05/06/2009  . Essential hypertension 05/25/2007    Dorene Ar, PTA 10/30/2019, 10:46 AM  [facility] [address] [place], [place], [address] Phone: [phone]   Fax:  [phone]  Name: [name] MRN: [id number] Date of Birth: May 01, 1951

## 2019-11-04 ENCOUNTER — Encounter: Payer: Self-pay | Admitting: Physical Therapy

## 2019-11-04 ENCOUNTER — Ambulatory Visit: Payer: Medicare Other | Attending: Physician Assistant | Admitting: Physical Therapy

## 2019-11-04 ENCOUNTER — Other Ambulatory Visit: Payer: Self-pay

## 2019-11-04 DIAGNOSIS — R2689 Other abnormalities of gait and mobility: Secondary | ICD-10-CM | POA: Insufficient documentation

## 2019-11-04 DIAGNOSIS — M25671 Stiffness of right ankle, not elsewhere classified: Secondary | ICD-10-CM | POA: Diagnosis not present

## 2019-11-04 DIAGNOSIS — M6281 Muscle weakness (generalized): Secondary | ICD-10-CM | POA: Insufficient documentation

## 2019-11-04 DIAGNOSIS — R600 Localized edema: Secondary | ICD-10-CM | POA: Insufficient documentation

## 2019-11-04 DIAGNOSIS — M25571 Pain in right ankle and joints of right foot: Secondary | ICD-10-CM | POA: Insufficient documentation

## 2019-11-04 NOTE — Therapy (Signed)
[place], [place], [address] Phone: [phone]   Fax:  [phone]  Physical Therapy Treatment  Patient Details  Name: [name] MRN: [id number] Date of Birth: 04-09-1951 Referring Provider (PT): Frankey Shown MD   Encounter Date: 11/04/2019  PT End of Session - 11/04/19 1051    Visit Number  6    Number of Visits  13    Date for PT Re-Evaluation  11/25/19    Authorization Type  UHC/Medicaid    PT Start Time  1045    PT Stop Time  1130    PT Time Calculation (min)  45 min    Activity Tolerance  Patient tolerated treatment well    Behavior During Therapy  [facility] for tasks assessed/performed       Past Medical History:  Diagnosis Date  . ATN (acute tubular necrosis) ([place]) 07/15/2014  . Automatic implantable cardioverter-defibrillator in situ   . Automatic implantable cardioverter-defibrillator in situ 05/04/2007   Qualifier: Diagnosis of  By: Isla Pence    . Cardiac defibrillator in situ    Atlas II VR (SJM) implanted by Dr Lovena Le  . CHF (congestive heart failure) ([place])   . Chronic combined systolic and diastolic heart failure (HCC)    a. EF 35-40% in past;  b. Echo 7/13:  EF 45-50%, Gr 2 diast dysfn, mild AI, mild MAC, trivial MR, mild LAE, PASP 47.  [name] Chronic ulcer of leg ([place])    04-09-15 resolved-not a problem.  . Colon polyps 04/12/2013   Rectosigmoid polyp  . COPD (chronic obstructive pulmonary disease) (HCC)    O2 at night  . Depression   . Diabetes mellitus   . Diabetes mellitus, type 2 ([place]) 04/03/2012   HIGH RISK FEET.. Please have patient take shoes and socks off every visit for visual foot inspection.    . Eczema   . Elevated alkaline phosphatase level    GGT and 5'nucleotidase 8/13 normal  . Health care maintenance 01/22/2013   Surgically absent cervix- no pap needed (Path report 07/2000: uterine body with attached bilateral adnexa and separate cervix.)  . History of oxygen  administration    oxygen @ 2 l/m nasally bedtime 24/7  . HTN (hypertension)   . Hx of cardiac cath    a. [place] 2003 normal;  b. LHC 6/13:  Mild calcification in the LM, o/w normal coronary arteries, EF 45%.   . Hyperkalemia 08/08/2017  . Hyperlipidemia   . Hyperlipidemia associated with type 2 diabetes mellitus ([place]) 05/25/2007   Qualifier: Diagnosis of  By: Hassell Done FNP, Tori Milks    . Hyperthyroidism, subclinical   . Implantable cardioverter-defibrillator (ICD) generator end of life   . Lipoma   . Need for hepatitis C screening test 04/25/2019  . NICM (nonischemic cardiomyopathy) ([place])   . Obesity   . On home oxygen therapy    "2L; 24/7" (10/11/2016)  . Post menopausal syndrome   . Shortness of breath 07/14/2017  . Skin rash 07/12/2018  . Sleep apnea    pt denies 04/12/2013    Past Surgical History:  Procedure Laterality Date  . ABDOMINAL HYSTERECTOMY    . CARDIAC CATHETERIZATION    . CARDIAC CATHETERIZATION N/A 06/21/2016   Procedure: Left Heart Cath and Coronary Angiography;  Surgeon: Sherren Mocha, MD;  Location: [place] [facility];  Service: Cardiovascular;  Laterality: N/A;  . CARDIAC DEFIBRILLATOR PLACEMENT  05/04/2007   SJM Atlas II VR ICD  . CARDIAC DEFIBRILLATOR PLACEMENT    .  CATARACT EXTRACTION     od  . COLONOSCOPY N/A 04/12/2013   Procedure: COLONOSCOPY;  Surgeon: Beryle Beams, MD;  Location: [facility];  Service: Endoscopy;  Laterality: N/A;  pt.has defibrilator  . COLONOSCOPY N/A 04/16/2015   Procedure: COLONOSCOPY;  Surgeon: [name], MD;  Location: [facility];  Service: Endoscopy;  Laterality: N/A;  . ESOPHAGOGASTRODUODENOSCOPY N/A 04/16/2015   Procedure: ESOPHAGOGASTRODUODENOSCOPY (EGD);  Surgeon: [name], MD;  Location: [facility];  Service: Endoscopy;  Laterality: N/A;  . EYE SURGERY    . HERNIA REPAIR    . HYSTEROSCOPY    . IMPLANTABLE CARDIOVERTER DEFIBRILLATOR (ICD) GENERATOR CHANGE N/A 04/02/2014   Procedure: ICD GENERATOR CHANGE;   Surgeon: Evans Lance, MD;  [facility];  Service: Cardiovascular;  Laterality: N/A;  . INSERT / REPLACE / REMOVE PACEMAKER    . LEFT HEART CATHETERIZATION WITH CORONARY ANGIOGRAM N/A 04/02/2012   Procedure: LEFT HEART CATHETERIZATION WITH CORONARY ANGIOGRAM;  Surgeon: Hillary Bow, MD;  Location: [facility] CATH LAB;  Service: Cardiovascular;  Laterality: N/A;  . ORIF ANKLE FRACTURE Right 08/12/2019   Procedure: OPEN REDUCTION INTERNAL FIXATION (ORIF) RIGHT TRIMALLEOLAR ANKLE FRACTURE;  Surgeon: Leandrew Koyanagi, MD;  Location: [place];  Service: Orthopedics;  Laterality: Right;  . TONSILLECTOMY    . TUBAL LIGATION      There were no vitals filed for this visit.  Subjective Assessment - 11/04/19 1049    Subjective  Pt arriving to therapy reporting no pain. Pt sceduled to see MD tomorrow for follow up.    Pertinent History  DM, restrictive lung disease, spinal stenosis, CHF, hypoxia, HTN    How long can you sit comfortably?  unlimited    How long can you stand comfortably?  10 miuntes    How long can you walk comfortably?  5 minutes, "it's hard to walk with boot on"    Diagnostic tests  X-ray    Patient Stated Goals  Walk without the boot, get back to normal    Currently in Pain?  No/denies                       [facility] Adult PT Treatment/Exercise - 11/04/19 0001      Knee/Hip Exercises: Supine   Straight Leg Raises  15 reps    Other Supine Knee/Hip Exercises  sidelying hip abduction x 15      Vasopneumatic   Number Minutes Vasopneumatic   10 minutes    Vasopnuematic Location   Ankle    Vasopneumatic Pressure  Medium    Vasopneumatic Temperature   34      Manual Therapy   Passive ROM  R ankle, all planes      Ankle Exercises: Standing   BAPS  --    BAPS Limitations  --      Ankle Exercises: Stretches   Soleus Stretch  2 reps;30 seconds    Gastroc Stretch  2 reps;30 seconds    Other Stretch  seated hamstring strech using strap x 3 reps holding 30 seconds each       Ankle Exercises: Seated   Towel Crunch  3 reps    BAPS  Sitting;Level 2;15 reps;Limitations    BAPS Limitations  difficulty with coordination of completing the rotation each direction     Other Seated Ankle Exercises  inversion/eversion x 15     Other Seated Ankle Exercises  rocking using prostretch, hodling x 10 seconds  Ankle Exercises: Supine   T-Band  all directions x 5 reps using red band     Other Supine Ankle Exercises  SLR; x 10 x 2 sets      Ankle Exercises: Sidelying   Other Sidelying Ankle Exercises  hip abdcution x 10 x 2 sets             PT Education - 11/04/19 1049    Education Details  Reviwed HEP    Person(s) Educated  Patient    Methods  Explanation    Comprehension  Verbalized understanding       PT Short Term Goals - 11/04/19 1058      PT SHORT TERM GOAL #1   Title  Pt will be independent in her HEP.    Time  3    Period  Weeks    Status  On-going    Target Date  11/05/19      PT SHORT TERM GOAL #2   Title  Pt will be able to demonstrate correct gait sequencing with/without LRAD.    Time  3    Period  Weeks    Status  On-going    Target Date  11/05/19        PT Long Term Goals - 11/04/19 1059      PT LONG TERM GOAL #1   Title  Pt will be able to amb >/= 500 feet on community level surfaces with no device with normalized gait pattern.    Baseline  amb with cam walker using a straight cane with antalgic gait pattern    Time  6    Period  Weeks    Status  New      PT LONG TERM GOAL #2   Title  Pt will improve her R knee strength to >/= 4+/5 in order to improve functional mobility.    Baseline  see flow sheets    Time  6    Period  Weeks    Status  New      PT LONG TERM GOAL #3   Title  Pt will be able to navigate 5 steps using a single hand rail without difficutly using step over step pattern.    Baseline  step to gait pattern using assistive device and hand rail.    Period  Weeks    Status  New      PT LONG TERM GOAL  #4   Title  Pt will improve her DF AROM to >/= 10 degrees bilaterally to increase her heel strike during gait.    Baseline  see flow sheets    Time  6    Period  Weeks    Status  New            Plan - 11/04/19 1052    Clinical Impression Statement  Pt progressing with ROM and gentle strengthening. Pt tolerating exercises with no increased pain reported only fatigue. BAPS board was added, pt with decreased coordination and fatigue in ankle noted. Pt scheduled for follow up with her MD tomorrow. Continue skilled PT.    Personal Factors and Comorbidities  Comorbidity 3+    Comorbidities  COPD, CHF,HTN depression, hypoxia, spinal stenosis    Examination-Activity Limitations  Lift;Stand;Transfers;Carry;Dressing;Stairs;Squat    Examination-Participation Restrictions  Community Activity;Other;Laundry    Stability/Clinical Decision Making  Stable/Uncomplicated    Rehab Potential  Good    PT Frequency  2x / week    PT Duration  6 weeks  PT Treatment/Interventions  ADLs/Self Care Home Management;Cryotherapy;Electrical Stimulation;Iontophoresis 32m/ml Dexamethasone;Ultrasound;Gait training;Stair training;Functional mobility training;Therapeutic activities;Therapeutic exercise;Balance training;Neuromuscular re-education;Patient/family education;Manual techniques;Vasopneumatic Device;Taping;Passive range of motion    PT Next Visit Plan  vasopneumatic, ankle ROM, strengthening, gait training, add 4 way ankle to HEP, BAPS board, add knee strength, MD appt Tuesday Feb 2    PT Home Exercise Plan  Access Code: [email or web address] (ABC's, inversion/eversion with towel, towel scrunches, DF stretch seated)    Consulted and Agree with Plan of Care  Patient       Patient will benefit from skilled therapeutic intervention in order to improve the following deficits and impairments:  Obesity, Difficulty walking, Increased edema, Decreased strength, Decreased range of motion, Decreased activity tolerance, Postural  dysfunction, Pain, Abnormal gait  Visit Diagnosis: Pain in right ankle and joints of right foot  Stiffness of right ankle, not elsewhere classified  Muscle weakness (generalized)  Localized edema     Problem List Patient Active Problem List   Diagnosis Date Noted  . Bimalleolar ankle fracture, right, closed, initial encounter   . Trapezius muscle strain 06/19/2019  . Skin rash 07/12/2018  . Need for immunization against influenza 06/13/2018  . 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 Chronic combined systolic and diastolic congestive heart failure ([place] 03/18/2016  . Hypoxia   . Spinal stenosis of lumbar region 12/09/2015  . Restrictive lung disease 06/24/2015  . Diabetic gastroparesis associated with type 2 diabetes mellitus ([place] 07/12/2014  . Depression 05/22/2014  . DMII (diabetes mellitus, type 2) ([place] 04/03/2012  . TOBACCO ABUSE 10/26/2009  . HYPERTHYROIDISM, SUBCLINICAL 05/06/2009  . Essential hypertension 05/25/2007    JOretha Caprice PT 11/04/2019, 11:12 AM  [facility] [address]Phone: [phone]  Fax:  [phone] Name: Peggy CHICKMRN: [id number] of Birth: 109/15/1952

## 2019-11-05 ENCOUNTER — Ambulatory Visit (INDEPENDENT_AMBULATORY_CARE_PROVIDER_SITE_OTHER): Payer: Medicare Other | Admitting: Orthopaedic Surgery

## 2019-11-05 ENCOUNTER — Ambulatory Visit (INDEPENDENT_AMBULATORY_CARE_PROVIDER_SITE_OTHER): Payer: Medicare Other

## 2019-11-05 ENCOUNTER — Encounter: Payer: Self-pay | Admitting: Orthopaedic Surgery

## 2019-11-05 DIAGNOSIS — S82841A Displaced bimalleolar fracture of right lower leg, initial encounter for closed fracture: Secondary | ICD-10-CM

## 2019-11-05 NOTE — Progress Notes (Signed)
Post-Op Visit Note   Patient: [name]           Date of Birth: 30-Oct-1950           MRN: [id number] Visit Date: 11/05/2019 PCP: Kathi Ludwig, MD   Assessment & Plan:  Chief Complaint:  Chief Complaint  Patient presents with  . Right Ankle - Pain   Visit Diagnoses:  1. Closed bimalleolar fracture of right ankle, initial encounter     Plan: Patient is a pleasant 69 year old female who comes in today 12 weeks status post ORIF left bimalleolar ankle fracture, date of surgery August 12, 2019.  She has been doing well.  She is still ambulating in a Cam walker but sleeping in her ASO at night.  She is in physical therapy twice a week making great progress.  She has slight soreness to the ankle but nothing more.  Examination of her left ankle reveals no tenderness to palpation.  She has slightly limited range of motion.  She is neurovascularly intact distally.  At this point, she will transition into an ASO brace weightbearing as tolerated.  She will continue with physical therapy and follow-up with [place] in 4 weeks time for repeat evaluation and 3 view x-rays of the left ankle.  Call with concerns or questions in the meantime.  Follow-Up Instructions: Return in about 4 weeks (around 12/03/2019).   Orders:  Orders Placed This Encounter  Procedures  . XR Ankle Complete Right   No orders of the defined types were placed in this encounter.   Imaging: XR Ankle Complete Right  Result Date: 11/05/2019 X-rays demonstrate a nearly healed bimalleolar ankle fracture.  There is still slight lucency to the medial malleolus.   PMFS History: Patient Active Problem List   Diagnosis Date Noted  . Bimalleolar ankle fracture, right, closed, initial encounter   . Trapezius muscle strain 06/19/2019  . Skin rash 07/12/2018  . Need for immunization against influenza 06/13/2018  . 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 Chronic combined systolic and diastolic congestive heart  failure ([place]) 03/18/2016  . Hypoxia   . Spinal stenosis of lumbar region 12/09/2015  . Restrictive lung disease 06/24/2015  . Diabetic gastroparesis associated with type 2 diabetes mellitus ([place]) 07/12/2014  . Depression 05/22/2014  . DMII (diabetes mellitus, type 2) ([place]) 04/03/2012  . TOBACCO ABUSE 10/26/2009  . HYPERTHYROIDISM, SUBCLINICAL 05/06/2009  . Essential hypertension 05/25/2007   Past Medical History:  Diagnosis Date  . ATN (acute tubular necrosis) ([place]) 07/15/2014  . Automatic implantable cardioverter-defibrillator in situ   . Automatic implantable cardioverter-defibrillator in situ 05/04/2007   Qualifier: Diagnosis of  By: Isla Pence    . Cardiac defibrillator in situ    Atlas II VR (SJM) implanted by Dr Lovena Le  . CHF (congestive heart failure) ([place])   . Chronic combined systolic and diastolic heart failure (HCC)    a. EF 35-40% in past;  b. Echo 7/13:  EF 45-50%, Gr 2 diast dysfn, mild AI, mild MAC, trivial MR, mild LAE, PASP 47.  [name] Chronic ulcer of leg ([place])    04-09-15 resolved-not a problem.  . Colon polyps 04/12/2013   Rectosigmoid polyp  . COPD (chronic obstructive pulmonary disease) (HCC)    O2 at night  . Depression   . Diabetes mellitus   . Diabetes mellitus, type 2 ([place]) 04/03/2012   HIGH RISK FEET.. Please have patient take shoes and socks off every visit for visual foot inspection.    . Eczema   .  Elevated alkaline phosphatase level    GGT and 5'nucleotidase 8/13 normal  . Health care maintenance 01/22/2013   Surgically absent cervix- no pap needed (Path report 07/2000: uterine body with attached bilateral adnexa and separate cervix.)  . History of oxygen administration    oxygen @ 2 l/m nasally bedtime 24/7  . HTN (hypertension)   . Hx of cardiac cath    a. [place] 2003 normal;  b. LHC 6/13:  Mild calcification in the LM, o/w normal coronary arteries, EF 45%.   . Hyperkalemia 08/08/2017  . Hyperlipidemia   . Hyperlipidemia associated with type 2  diabetes mellitus ([place]) 05/25/2007   Qualifier: Diagnosis of  By: Hassell Done FNP, Tori Milks    . Hyperthyroidism, subclinical   . Implantable cardioverter-defibrillator (ICD) generator end of life   . Lipoma   . Need for hepatitis C screening test 04/25/2019  . NICM (nonischemic cardiomyopathy) ([place])   . Obesity   . On home oxygen therapy    "2L; 24/7" (10/11/2016)  . Post menopausal syndrome   . Shortness of breath 07/14/2017  . Skin rash 07/12/2018  . Sleep apnea    pt denies 04/12/2013    Family History  Problem Relation Age of Onset  . Stroke Mother   . Seizures Father   . Heart disease Father   . Diabetes Sister   . Asthma Maternal Aunt        aunts  . Asthma Maternal Uncle        uncles  . Heart disease Paternal Aunt        aunts  . Heart disease Paternal Uncle        uncles  . Heart disease Maternal Aunt        aunts  . Heart disease Maternal Uncle        uncles  . Heart disease Maternal Grandfather   . Colon cancer Neg Hx   . Colon polyps Neg Hx   . Esophageal cancer Neg Hx   . Kidney disease Neg Hx   . Gallbladder disease Neg Hx     Past Surgical History:  Procedure Laterality Date  . ABDOMINAL HYSTERECTOMY    . CARDIAC CATHETERIZATION    . CARDIAC CATHETERIZATION N/A 06/21/2016   Procedure: Left Heart Cath and Coronary Angiography;  Surgeon: Sherren Mocha, MD;  Location: [place] CV LAB;  Service: Cardiovascular;  Laterality: N/A;  . CARDIAC DEFIBRILLATOR PLACEMENT  05/04/2007   SJM Atlas II VR ICD  . CARDIAC DEFIBRILLATOR PLACEMENT    . CATARACT EXTRACTION     od  . COLONOSCOPY N/A 04/12/2013   Procedure: COLONOSCOPY;  Surgeon: Beryle Beams, MD;  [facility];  Service: Endoscopy;  Laterality: N/A;  pt.has defibrilator  . COLONOSCOPY N/A 04/16/2015   Procedure: COLONOSCOPY;  Surgeon: [name], MD;  Location[facility];  Service: Endoscopy;  Laterality: N/A;  . ESOPHAGOGASTRODUODENOSCOPY N/A 04/16/2015   Procedure:  ESOPHAGOGASTRODUODENOSCOPY (EGD);  Surgeon: [name], MD;  Location: [facility];  Service: Endoscopy;  Laterality: N/A;  . EYE SURGERY    . HERNIA REPAIR    . HYSTEROSCOPY    . IMPLANTABLE CARDIOVERTER DEFIBRILLATOR (ICD) GENERATOR CHANGE N/A 04/02/2014   Procedure: ICD GENERATOR CHANGE;  Surgeon: Evans Lance, MD;  Location: [facility] C[facility] LAB;  Service: Cardiovascular;  Laterality: N/A;  . INSERT / REPLACE / REMOVE PACEMAKER    . LEFT HEART CATHETERIZATION WITH CORONARY ANGIOGRAM N/A 04/02/2012   Procedure: LEFT HEART CATHETERIZATION WITH CORONARY ANGIOGRAM;  Surgeon:  Hillary Bow, MD;  Location[facility] C[facility] LAB;  Service: Cardiovascular;  Laterality: N/A;  . ORIF ANKLE FRACTURE Right 08/12/2019   Procedure: OPEN REDUCTION INTERNAL FIXATION (ORIF) RIGHT TRIMALLEOLAR ANKLE FRACTURE;  Surgeon: Leandrew Koyanagi, MD;  Location: [place];  Service: Orthopedics;  Laterality: Right;  . TONSILLECTOMY    . TUBAL LIGATION     Social History   Occupational History  . Occupation: disabled  Tobacco Use  . Smoking status: Current Every Day Smoker    Packs/day: 0.10    Years: 39.00    Pack years: 3.90    Types: Cigarettes  . Smokeless tobacco: Never Used  . Tobacco comment: 2-3 cig/day.  Substance and Sexual Activity  . Alcohol use: No    Alcohol/week: 1.0 standard drinks    Types: 1 Glasses of wine per week  . Drug use: No  . Sexual activity: Not Currently

## 2019-11-06 ENCOUNTER — Ambulatory Visit: Payer: Medicare Other | Admitting: Physical Therapy

## 2019-11-06 ENCOUNTER — Encounter: Payer: Self-pay | Admitting: Physical Therapy

## 2019-11-06 ENCOUNTER — Other Ambulatory Visit: Payer: Self-pay

## 2019-11-06 DIAGNOSIS — M25571 Pain in right ankle and joints of right foot: Secondary | ICD-10-CM

## 2019-11-06 DIAGNOSIS — M6281 Muscle weakness (generalized): Secondary | ICD-10-CM | POA: Diagnosis not present

## 2019-11-06 DIAGNOSIS — R2689 Other abnormalities of gait and mobility: Secondary | ICD-10-CM

## 2019-11-06 DIAGNOSIS — M25671 Stiffness of right ankle, not elsewhere classified: Secondary | ICD-10-CM | POA: Diagnosis not present

## 2019-11-06 DIAGNOSIS — R600 Localized edema: Secondary | ICD-10-CM

## 2019-11-06 NOTE — Therapy (Signed)
[place], [place], [address] Phone: [phone]   Fax:  [phone]  Physical Therapy Treatment  Patient Details  Name: [name] MRN: [id number] Date of Birth: 1950/12/16 Referring Provider (PT): Frankey Shown MD   Encounter Date: 11/06/2019  PT End of Session - 11/06/19 1058    Visit Number  7    Number of Visits  13    Date for PT Re-Evaluation  11/25/19    Authorization Type  UHC/Medicaid    PT Start Time  0272    PT Stop Time  1140    PT Time Calculation (min)  53 min    Activity Tolerance  Patient tolerated treatment well    Behavior During Therapy  [facility] for tasks assessed/performed       Past Medical History:  Diagnosis Date  . ATN (acute tubular necrosis) ([place]) 07/15/2014  . Automatic implantable cardioverter-defibrillator in situ   . Automatic implantable cardioverter-defibrillator in situ 05/04/2007   Qualifier: Diagnosis of  By: Isla Pence    . Cardiac defibrillator in situ    Atlas II VR (SJM) implanted by Dr Lovena Le  . CHF (congestive heart failure) ([place])   . Chronic combined systolic and diastolic heart failure (HCC)    a. EF 35-40% in past;  b. Echo 7/13:  EF 45-50%, Gr 2 diast dysfn, mild AI, mild MAC, trivial MR, mild LAE, PASP 47.  [name] Chronic ulcer of leg ([place])    04-09-15 resolved-not a problem.  . Colon polyps 04/12/2013   Rectosigmoid polyp  . COPD (chronic obstructive pulmonary disease) (HCC)    O2 at night  . Depression   . Diabetes mellitus   . Diabetes mellitus, type 2 ([place]) 04/03/2012   HIGH RISK FEET.. Please have patient take shoes and socks off every visit for visual foot inspection.    . Eczema   . Elevated alkaline phosphatase level    GGT and 5'nucleotidase 8/13 normal  . Health care maintenance 01/22/2013   Surgically absent cervix- no pap needed (Path report 07/2000: uterine body with attached bilateral adnexa and separate cervix.)  . History of oxygen  administration    oxygen @ 2 l/m nasally bedtime 24/7  . HTN (hypertension)   . Hx of cardiac cath    a. [place] 2003 normal;  b. LHC 6/13:  Mild calcification in the LM, o/w normal coronary arteries, EF 45%.   . Hyperkalemia 08/08/2017  . Hyperlipidemia   . Hyperlipidemia associated with type 2 diabetes mellitus ([place]) 05/25/2007   Qualifier: Diagnosis of  By: Hassell Done FNP, Tori Milks    . Hyperthyroidism, subclinical   . Implantable cardioverter-defibrillator (ICD) generator end of life   . Lipoma   . Need for hepatitis C screening test 04/25/2019  . NICM (nonischemic cardiomyopathy) ([place])   . Obesity   . On home oxygen therapy    "2L; 24/7" (10/11/2016)  . Post menopausal syndrome   . Shortness of breath 07/14/2017  . Skin rash 07/12/2018  . Sleep apnea    pt denies 04/12/2013    Past Surgical History:  Procedure Laterality Date  . ABDOMINAL HYSTERECTOMY    . CARDIAC CATHETERIZATION    . CARDIAC CATHETERIZATION N/A 06/21/2016   Procedure: Left Heart Cath and Coronary Angiography;  Surgeon: Sherren Mocha, MD;  Location: [facility];  Service: Cardiovascular;  Laterality: N/A;  . CARDIAC DEFIBRILLATOR PLACEMENT  05/04/2007   SJM Atlas II VR ICD  . CARDIAC DEFIBRILLATOR PLACEMENT    .  CATARACT EXTRACTION     od  . COLONOSCOPY N/A 04/12/2013   Procedure: COLONOSCOPY;  Surgeon: Beryle Beams, MD;  [facility]: [facility];  Service: Endoscopy;  Laterality: N/A;  pt.has defibrilator  . COLONOSCOPY N/A 04/16/2015   Procedure: COLONOSCOPY;  Surgeon: [name], MD;  [facility]: [facility];  Service: Endoscopy;  Laterality: N/A;  . ESOPHAGOGASTRODUODENOSCOPY N/A 04/16/2015   Procedure: ESOPHAGOGASTRODUODENOSCOPY (EGD);  Surgeon: [name], MD;  Location: [facility];  Service: Endoscopy;  Laterality: N/A;  . EYE SURGERY    . HERNIA REPAIR    . HYSTEROSCOPY    . IMPLANTABLE CARDIOVERTER DEFIBRILLATOR (ICD) GENERATOR CHANGE N/A 04/02/2014   Procedure: ICD GENERATOR CHANGE;   Surgeon: Evans Lance, MD;  Location: [facility];  Service: Cardiovascular;  Laterality: N/A;  . INSERT / REPLACE / REMOVE PACEMAKER    . LEFT HEART CATHETERIZATION WITH CORONARY ANGIOGRAM N/A 04/02/2012   Procedure: LEFT HEART CATHETERIZATION WITH CORONARY ANGIOGRAM;  Surgeon: Hillary Bow, MD;  Location: [facility] CATH LAB;  Service: Cardiovascular;  Laterality: N/A;  . ORIF ANKLE FRACTURE Right 08/12/2019   Procedure: OPEN REDUCTION INTERNAL FIXATION (ORIF) RIGHT TRIMALLEOLAR ANKLE FRACTURE;  Surgeon: Leandrew Koyanagi, MD;  Location: [facility];  Service: Orthopedics;  Laterality: Right;  . TONSILLECTOMY    . TUBAL LIGATION      There were no vitals filed for this visit.  Subjective Assessment - 11/06/19 1104    Subjective  Pt arriving to therpay with no CAM boot after visiting the MD yesterday. Pt reprorting no pain in L ankle.    Pertinent History  DM, restrictive lung disease, spinal stenosis, CHF, hypoxia, HTN    How long can you sit comfortably?  unlimited    How long can you stand comfortably?  10 miuntes    How long can you walk comfortably?  5 minutes, "it's hard to walk with boot on"    Diagnostic tests  X-ray    Patient Stated Goals  Walk without the boot, get back to normal    Currently in Pain?  No/denies                       [facility] Adult PT Treatment/Exercise - 11/06/19 0001      Knee/Hip Exercises: Supine   Straight Leg Raises  15 reps      Vasopneumatic   Number Minutes Vasopneumatic   10 minutes    Vasopnuematic Location   Ankle    Vasopneumatic Pressure  Medium    Vasopneumatic Temperature   34      Manual Therapy   Passive ROM  R ankle, all planes      Ankle Exercises: Standing   Heel Raises  Both;15 reps    Toe Raise  15 reps;Limitations    Other Standing Ankle Exercises  standing marching with UE support x 20 reps    Other Standing Ankle Exercises  standing lunge with L LE back holding strech for 20 seocnds x 5 reps using UE support      Ankle  Exercises: Stretches   Soleus Stretch  2 reps;30 seconds;Limitations    Gastroc Stretch  2 reps;30 seconds;Limitations    Gastroc Stretch Limitations  standing with UE support    Other Stretch  seated hamstring strech using strap x 3 reps holding 30 seconds each      Ankle Exercises: Seated   BAPS  Sitting;Level 2;15 reps;Limitations    BAPS Limitations  difficulty with coordination  of completing the rotation each direction     Other Seated Ankle Exercises  inversion/eversion x 15       Ankle Exercises: Supine   T-Band  all directions x 5 reps using red band                PT Short Term Goals - 11/06/19 1102      PT SHORT TERM GOAL #1   Title  Pt will be independent in her HEP.    Time  3    Period  Weeks    Status  On-going    Target Date  11/05/19      PT SHORT TERM GOAL #2   Title  Pt will be able to demonstrate correct gait sequencing with/without LRAD.    Baseline  Pt amb today with antalgic gait with decreasd df and foot clearance on R LE    Time  3    Period  Weeks    Status  On-going    Target Date  11/05/19        PT Long Term Goals - 11/04/19 1059      PT LONG TERM GOAL #1   Title  Pt will be able to amb >/= 500 feet on community level surfaces with no device with normalized gait pattern.    Baseline  amb with cam walker using a straight cane with antalgic gait pattern    Time  6    Period  Weeks    Status  New      PT LONG TERM GOAL #2   Title  Pt will improve her R knee strength to >/= 4+/5 in order to improve functional mobility.    Baseline  see flow sheets    Time  6    Period  Weeks    Status  New      PT LONG TERM GOAL #3   Title  Pt will be able to navigate 5 steps using a single hand rail without difficutly using step over step pattern.    Baseline  step to gait pattern using assistive device and hand rail.    Period  Weeks    Status  New      PT LONG TERM GOAL #4   Title  Pt will improve her DF AROM to >/= 10 degrees bilaterally  to increase her heel strike during gait.    Baseline  see flow sheets    Time  6    Period  Weeks    Status  New            Plan - 11/06/19 1059    Clinical Impression Statement  Pt arriving today after seeing MD yesterday. Pt's CAM boot was discharged. Pt amb into clinic with mild antalgic gait with decreased DF in L ankle with decreased foot clearance. Pt tolerating all exericses well progress to more standing and wight bearin activities. Continue skilled PT to progress toward pt's goals set.    Personal Factors and Comorbidities  Comorbidity 3+    Comorbidities  COPD, CHF,HTN depression, hypoxia, spinal stenosis    Examination-Activity Limitations  Lift;Stand;Transfers;Carry;Dressing;Stairs;Squat    Examination-Participation Restrictions  Community Activity;Other;Laundry    Stability/Clinical Decision Making  Stable/Uncomplicated    Rehab Potential  Good    PT Frequency  2x / week    PT Duration  6 weeks    PT Treatment/Interventions  ADLs/Self Care Home Management;Cryotherapy;Electrical Stimulation;Iontophoresis 25m/ml Dexamethasone;Ultrasound;Gait training;Stair training;Functional mobility training;Therapeutic activities;Therapeutic exercise;Balance training;Neuromuscular re-education;Patient/family education;Manual  techniques;Vasopneumatic Device;Taping;Passive range of motion    PT Next Visit Plan  vasopneumatic, ankle ROM, strengthening, gait training, add 4 way ankle to HEP, BAPS board, add knee strength, MD appt Tuesday Feb 2    PT Home Exercise Plan  Access Code: [email or web address] (ABC's, inversion/eversion with towel, towel scrunches, DF stretch seated)       Patient will benefit from skilled therapeutic intervention in order to improve the following deficits and impairments:  Obesity, Difficulty walking, Increased edema, Decreased strength, Decreased range of motion, Decreased activity tolerance, Postural dysfunction, Pain, Abnormal gait  Visit Diagnosis: Stiffness of right  ankle, not elsewhere classified  Pain in right ankle and joints of right foot  Muscle weakness (generalized)  Localized edema  Other abnormalities of gait and mobility     Problem List Patient Active Problem List   Diagnosis Date Noted  . Bimalleolar ankle fracture, right, closed, initial encounter   . Trapezius muscle strain 06/19/2019  . Skin rash 07/12/2018  . Need for immunization against influenza 06/13/2018  . 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 Chronic combined systolic and diastolic congestive heart failure ([place]) 03/18/2016  . Hypoxia   . Spinal stenosis of lumbar region 12/09/2015  . Restrictive lung disease 06/24/2015  . Diabetic gastroparesis associated with type 2 diabetes mellitus ([place]) 07/12/2014  . Depression 05/22/2014  . DMII (diabetes mellitus, type 2) ([place]) 04/03/2012  . TOBACCO ABUSE 10/26/2009  . HYPERTHYROIDISM, SUBCLINICAL 05/06/2009  . Essential hypertension 05/25/2007    Oretha Caprice, PT 11/06/2019, 11:26 AM  [facility] [address] [place], [place], [address] Phone: [phone]   Fax:  [phone]  Name: [name] MRN: [id number] Date of Birth: [birth year]

## 2019-11-11 ENCOUNTER — Ambulatory Visit: Payer: Medicare Other | Admitting: Physical Therapy

## 2019-11-11 ENCOUNTER — Other Ambulatory Visit: Payer: Self-pay

## 2019-11-11 DIAGNOSIS — R600 Localized edema: Secondary | ICD-10-CM

## 2019-11-11 DIAGNOSIS — M25671 Stiffness of right ankle, not elsewhere classified: Secondary | ICD-10-CM | POA: Diagnosis not present

## 2019-11-11 DIAGNOSIS — M6281 Muscle weakness (generalized): Secondary | ICD-10-CM | POA: Diagnosis not present

## 2019-11-11 DIAGNOSIS — R2689 Other abnormalities of gait and mobility: Secondary | ICD-10-CM

## 2019-11-11 DIAGNOSIS — M25571 Pain in right ankle and joints of right foot: Secondary | ICD-10-CM | POA: Diagnosis not present

## 2019-11-11 NOTE — Therapy (Signed)
[place], [place], [address] Phone: [phone]   Fax:  [phone]  Physical Therapy Treatment  Patient Details  Name: [name] MRN: [id number] Date of Birth: [birth year] Referring Provider (PT): Frankey Shown MD   Encounter Date: 11/11/2019  PT End of Session - 11/11/19 1105    Visit Number  8    Number of Visits  13    Date for PT Re-Evaluation  11/25/19    Authorization Type  UHC/Medicaid    PT Start Time  1045    PT Stop Time  1140    PT Time Calculation (min)  55 min    Activity Tolerance  Patient tolerated treatment well    Behavior During Therapy  [facility] for tasks assessed/performed       Past Medical History:  Diagnosis Date  . ATN (acute tubular necrosis) ([place]) 07/15/2014  . Automatic implantable cardioverter-defibrillator in situ   . Automatic implantable cardioverter-defibrillator in situ 05/04/2007   Qualifier: Diagnosis of  By: Isla Pence    . Cardiac defibrillator in situ    Atlas II VR (SJM) implanted by Dr Lovena Le  . CHF (congestive heart failure) ([place])   . Chronic combined systolic and diastolic heart failure (HCC)    a. EF 35-40% in past;  b. Echo 7/13:  EF 45-50%, Gr 2 diast dysfn, mild AI, mild MAC, trivial MR, mild LAE, PASP 47.  [name] Chronic ulcer of leg ([place])    04-09-15 resolved-not a problem.  . Colon polyps 04/12/2013   Rectosigmoid polyp  . COPD (chronic obstructive pulmonary disease) (HCC)    O2 at night  . Depression   . Diabetes mellitus   . Diabetes mellitus, type 2 ([place]) 04/03/2012   HIGH RISK FEET.. Please have patient take shoes and socks off every visit for visual foot inspection.    . Eczema   . Elevated alkaline phosphatase level    GGT and 5'nucleotidase 8/13 normal  . Health care maintenance 01/22/2013   Surgically absent cervix- no pap needed (Path report 07/2000: uterine body with attached bilateral adnexa and separate cervix.)  . History of oxygen  administration    oxygen @ 2 l/m nasally bedtime 24/7  . HTN (hypertension)   . Hx of cardiac cath    a. [place] 2003 normal;  b. LHC 6/13:  Mild calcification in the LM, o/w normal coronary arteries, EF 45%.   . Hyperkalemia 08/08/2017  . Hyperlipidemia   . Hyperlipidemia associated with type 2 diabetes mellitus ([place]) 05/25/2007   Qualifier: Diagnosis of  By: Hassell Done FNP, Tori Milks    . Hyperthyroidism, subclinical   . Implantable cardioverter-defibrillator (ICD) generator end of life   . Lipoma   . Need for hepatitis C screening test 04/25/2019  . NICM (nonischemic cardiomyopathy) ([place])   . Obesity   . On home oxygen therapy    "2L; 24/7" (10/11/2016)  . Post menopausal syndrome   . Shortness of breath 07/14/2017  . Skin rash 07/12/2018  . Sleep apnea    pt denies 04/12/2013    Past Surgical History:  Procedure Laterality Date  . ABDOMINAL HYSTERECTOMY    . CARDIAC CATHETERIZATION    . CARDIAC CATHETERIZATION N/A 06/21/2016   Procedure: Left Heart Cath and Coronary Angiography;  Surgeon: Sherren Mocha, MD;  Location: [facility];  Service: Cardiovascular;  Laterality: N/A;  . CARDIAC DEFIBRILLATOR PLACEMENT  05/04/2007   SJM Atlas II VR ICD  . CARDIAC DEFIBRILLATOR PLACEMENT    .  CATARACT EXTRACTION     od  . COLONOSCOPY N/A 04/12/2013   Procedure: COLONOSCOPY;  Surgeon: Beryle Beams, MD;  [facility];  Service: Endoscopy;  Laterality: N/A;  pt.has defibrilator  . COLONOSCOPY N/A 04/16/2015   Procedure: COLONOSCOPY;  Surgeon: [name], MD;  [facility];  Service: Endoscopy;  Laterality: N/A;  . ESOPHAGOGASTRODUODENOSCOPY N/A 04/16/2015   Procedure: ESOPHAGOGASTRODUODENOSCOPY (EGD);  Surgeon: [name], MD;  Location: [facility];  Service: Endoscopy;  Laterality: N/A;  . EYE SURGERY    . HERNIA REPAIR    . HYSTEROSCOPY    . IMPLANTABLE CARDIOVERTER DEFIBRILLATOR (ICD) GENERATOR CHANGE N/A 04/02/2014   Procedure: ICD GENERATOR CHANGE;   Surgeon: Evans Lance, MD;  Location: [facility] CATH LAB;  Service: Cardiovascular;  Laterality: N/A;  . INSERT / REPLACE / REMOVE PACEMAKER    . LEFT HEART CATHETERIZATION WITH CORONARY ANGIOGRAM N/A 04/02/2012   Procedure: LEFT HEART CATHETERIZATION WITH CORONARY ANGIOGRAM;  Surgeon: Hillary Bow, MD;  Location: [facility] CATH LAB;  Service: Cardiovascular;  Laterality: N/A;  . ORIF ANKLE FRACTURE Right 08/12/2019   Procedure: OPEN REDUCTION INTERNAL FIXATION (ORIF) RIGHT TRIMALLEOLAR ANKLE FRACTURE;  Surgeon: Leandrew Koyanagi, MD;  Location: [facility];  Service: Orthopedics;  Laterality: Right;  . TONSILLECTOMY    . TUBAL LIGATION      There were no vitals filed for this visit.  Subjective Assessment - 11/11/19 1100    Subjective  Pt arriving to therpay with antalgic gait. No pain reported.    Pertinent History  DM, restrictive lung disease, spinal stenosis, CHF, hypoxia, HTN    How long can you sit comfortably?  unlimited    How long can you stand comfortably?  10 miuntes    How long can you walk comfortably?  5 minutes, "it's hard to walk with boot on"    Diagnostic tests  X-ray    Patient Stated Goals  Walk without the boot, get back to normal    Currently in Pain?  No/denies         [facility] PT Assessment - 11/11/19 0001      Assessment   Medical Diagnosis  R ankle trimalleolar fx s/p ORIFon 08/12/2019    Referring Provider (PT)  Frankey Shown MD    Onset Date/Surgical Date  08/12/19    Hand Dominance  Right    Next MD Visit  4 weeks     Prior Therapy  no      Precautions   Precaution Comments  Cam Walker discontinued by MD      Restrictions   Weight Bearing Restrictions  Yes    RLE Weight Bearing  Weight bearing as tolerated                   [facility] Adult PT Treatment/Exercise - 11/11/19 0001      Vasopneumatic   Number Minutes Vasopneumatic   10 minutes    Vasopnuematic Location   Ankle    Vasopneumatic Pressure  Medium    Vasopneumatic Temperature   34      Manual  Therapy   Passive ROM  R ankle, all planes      Ankle Exercises: Standing   Heel Raises  Both;15 reps    Toe Raise  15 reps;Limitations    Braiding (Round Trip)  15 feet x 3 each direction    Other Standing Ankle Exercises  standing marching with UE support x 20 reps on Airex, standing hip abd x  15 on Airex    Other Standing Ankle Exercises  standing lunge with L LE back holding strech for 20 seocnds x 5 reps using UE support      Ankle Exercises: Stretches   Soleus Stretch  2 reps;30 seconds;Limitations    Gastroc Stretch  2 reps;30 seconds;Limitations    Gastroc Stretch Limitations  standing with UE support    Other Stretch  seated hamstring strech using strap x 3 reps holding 30 seconds each      Ankle Exercises: Seated   BAPS  Sitting;Level 2;15 reps;Limitations               PT Short Term Goals - 11/11/19 1109      PT SHORT TERM GOAL #1   Title  Pt will be independent in her HEP.    Time  3    Period  Weeks    Status  On-going    Target Date  11/05/19      PT SHORT TERM GOAL #2   Title  Pt will be able to demonstrate correct gait sequencing with/without LRAD.    Baseline  Pt amb today with antalgic gait with decreasd df and foot clearance on R LE    Time  3    Period  Weeks    Status  On-going    Target Date  11/05/19        PT Long Term Goals - 11/04/19 1059      PT LONG TERM GOAL #1   Title  Pt will be able to amb >/= 500 feet on community level surfaces with no device with normalized gait pattern.    Baseline  amb with cam walker using a straight cane with antalgic gait pattern    Time  6    Period  Weeks    Status  New      PT LONG TERM GOAL #2   Title  Pt will improve her R knee strength to >/= 4+/5 in order to improve functional mobility.    Baseline  see flow sheets    Time  6    Period  Weeks    Status  New      PT LONG TERM GOAL #3   Title  Pt will be able to navigate 5 steps using a single hand rail without difficutly using step over  step pattern.    Baseline  step to gait pattern using assistive device and hand rail.    Period  Weeks    Status  New      PT LONG TERM GOAL #4   Title  Pt will improve her DF AROM to >/= 10 degrees bilaterally to increase her heel strike during gait.    Baseline  see flow sheets    Time  6    Period  Weeks    Status  New            Plan - 11/11/19 1106    Clinical Impression Statement  Pt arriving today amb with antalgic gait pattern. No Cam walker secondary to MD dischared it last week. Pt tolerating exericses well with increased SOB noted, Pt reporting she didn't take her inhaler this morning. Pt's O2 sats ranging from 94-97% on RA. Continue skilled PT to progress toward pt's goals set.    Personal Factors and Comorbidities  Comorbidity 3+    Comorbidities  COPD, CHF,HTN depression, hypoxia, spinal stenosis    Examination-Activity Limitations  Lift;Stand;Transfers;Carry;Dressing;Stairs;Squat    Examination-Participation Restrictions  Community Activity;Other;Laundry    Stability/Clinical Decision Making  Stable/Uncomplicated    Rehab Potential  Good    PT Frequency  2x / week    PT Duration  6 weeks    PT Treatment/Interventions  ADLs/Self Care Home Management;Cryotherapy;Electrical Stimulation;Iontophoresis 27m/ml Dexamethasone;Ultrasound;Gait training;Stair training;Functional mobility training;Therapeutic activities;Therapeutic exercise;Balance training;Neuromuscular re-education;Patient/family education;Manual techniques;Vasopneumatic Device;Taping;Passive range of motion    PT Next Visit Plan  vasopneumatic, ankle ROM, strengthening, gait training, add 4 way ankle to HEP, BAPS board, add knee strength, MD appt Tuesday Feb 2    PT Home Exercise Plan  Access Code: [email or web address] (ABC's, inversion/eversion with towel, towel scrunches, DF stretch seated)    Consulted and Agree with Plan of Care  Patient       Patient will benefit from skilled therapeutic intervention in order to  improve the following deficits and impairments:  Obesity, Difficulty walking, Increased edema, Decreased strength, Decreased range of motion, Decreased activity tolerance, Postural dysfunction, Pain, Abnormal gait  Visit Diagnosis: Stiffness of right ankle, not elsewhere classified  Pain in right ankle and joints of right foot  Muscle weakness (generalized)  Localized edema  Other abnormalities of gait and mobility     Problem List Patient Active Problem List   Diagnosis Date Noted  . Bimalleolar ankle fracture, right, closed, initial encounter   . Trapezius muscle strain 06/19/2019  . Skin rash 07/12/2018  . Need for immunization against influenza 06/13/2018  . 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 Chronic combined systolic and diastolic congestive heart failure ([place] 03/18/2016  . Hypoxia   . Spinal stenosis of lumbar region 12/09/2015  . Restrictive lung disease 06/24/2015  . Diabetic gastroparesis associated with type 2 diabetes mellitus ([place] 07/12/2014  . Depression 05/22/2014  . DMII (diabetes mellitus, type 2) ([place] 04/03/2012  . TOBACCO ABUSE 10/26/2009  . HYPERTHYROIDISM, SUBCLINICAL 05/06/2009  . Essential hypertension 05/25/2007    JOretha Caprice PT 11/11/2019, 11:15 AM  [facility]GR[address] [place] [address]Phone: [phone]  Fax:  [phone] Name: Peggy MOCCIOMRN: [id number] of Birth: 111/29/1952

## 2019-11-13 ENCOUNTER — Encounter: Payer: Self-pay | Admitting: Physical Therapy

## 2019-11-13 ENCOUNTER — Other Ambulatory Visit: Payer: Self-pay

## 2019-11-13 ENCOUNTER — Ambulatory Visit: Payer: Medicare Other | Admitting: Physical Therapy

## 2019-11-13 DIAGNOSIS — M25671 Stiffness of right ankle, not elsewhere classified: Secondary | ICD-10-CM | POA: Diagnosis not present

## 2019-11-13 DIAGNOSIS — R600 Localized edema: Secondary | ICD-10-CM | POA: Diagnosis not present

## 2019-11-13 DIAGNOSIS — M25571 Pain in right ankle and joints of right foot: Secondary | ICD-10-CM | POA: Diagnosis not present

## 2019-11-13 DIAGNOSIS — R2689 Other abnormalities of gait and mobility: Secondary | ICD-10-CM | POA: Diagnosis not present

## 2019-11-13 DIAGNOSIS — M6281 Muscle weakness (generalized): Secondary | ICD-10-CM | POA: Diagnosis not present

## 2019-11-13 NOTE — Therapy (Addendum)
[place], [place], [address] Phone: [phone]   Fax:  [phone]  Physical Therapy Treatment  Patient Details  Name: [name] MRN: [id number] Date of Birth: Feb 10, 1951 Referring Provider (PT): Frankey Shown MD   Encounter Date: 11/13/2019  PT End of Session - 11/13/19 1058    Visit Number  9    Number of Visits  13    Date for PT Re-Evaluation  11/25/19    Authorization Type  UHC/Medicaid    PT Start Time  1048    PT Stop Time  1140    PT Time Calculation (min)  52 min    Activity Tolerance  Patient tolerated treatment well    Behavior During Therapy  [facility] for tasks assessed/performed       Past Medical History:  Diagnosis Date  . ATN (acute tubular necrosis) ([place]) 07/15/2014  . Automatic implantable cardioverter-defibrillator in situ   . Automatic implantable cardioverter-defibrillator in situ 05/04/2007   Qualifier: Diagnosis of  By: Isla Pence    . Cardiac defibrillator in situ    Atlas II VR (SJM) implanted by Dr Lovena Le  . CHF (congestive heart failure) ([place])   . Chronic combined systolic and diastolic heart failure (HCC)    a. EF 35-40% in past;  b. Echo 7/13:  EF 45-50%, Gr 2 diast dysfn, mild AI, mild MAC, trivial MR, mild LAE, PASP 47.  [name] Chronic ulcer of leg ([place])    04-09-15 resolved-not a problem.  . Colon polyps 04/12/2013   Rectosigmoid polyp  . COPD (chronic obstructive pulmonary disease) (HCC)    O2 at night  . Depression   . Diabetes mellitus   . Diabetes mellitus, type 2 ([place]) 04/03/2012   HIGH RISK FEET.. Please have patient take shoes and socks off every visit for visual foot inspection.    . Eczema   . Elevated alkaline phosphatase level    GGT and 5'nucleotidase 8/13 normal  . Health care maintenance 01/22/2013   Surgically absent cervix- no pap needed (Path report 07/2000: uterine body with attached bilateral adnexa and separate cervix.)  . History of oxygen  administration    oxygen @ 2 l/m nasally bedtime 24/7  . HTN (hypertension)   . Hx of cardiac cath    a. [place] 2003 normal;  b. LHC 6/13:  Mild calcification in the LM, o/w normal coronary arteries, EF 45%.   . Hyperkalemia 08/08/2017  . Hyperlipidemia   . Hyperlipidemia associated with type 2 diabetes mellitus ([place]) 05/25/2007   Qualifier: Diagnosis of  By: Hassell Done FNP, Tori Milks    . Hyperthyroidism, subclinical   . Implantable cardioverter-defibrillator (ICD) generator end of life   . Lipoma   . Need for hepatitis C screening test 04/25/2019  . NICM (nonischemic cardiomyopathy) ([place])   . Obesity   . On home oxygen therapy    "2L; 24/7" (10/11/2016)  . Post menopausal syndrome   . Shortness of breath 07/14/2017  . Skin rash 07/12/2018  . Sleep apnea    pt denies 04/12/2013    Past Surgical History:  Procedure Laterality Date  . ABDOMINAL HYSTERECTOMY    . CARDIAC CATHETERIZATION    . CARDIAC CATHETERIZATION N/A 06/21/2016   Procedure: Left Heart Cath and Coronary Angiography;  Surgeon: Sherren Mocha, MD;  Location: [place] [facility];  Service: Cardiovascular;  Laterality: N/A;  . CARDIAC DEFIBRILLATOR PLACEMENT  05/04/2007   SJM Atlas II VR ICD  . CARDIAC DEFIBRILLATOR PLACEMENT    .  CATARACT EXTRACTION     od  . COLONOSCOPY N/A 04/12/2013   Procedure: COLONOSCOPY;  Surgeon: Beryle Beams, MD;  [facility];  Service: Endoscopy;  Laterality: N/A;  pt.has defibrilator  . COLONOSCOPY N/A 04/16/2015   Procedure: COLONOSCOPY;  Surgeon: [name], MD;  [facility];  Service: Endoscopy;  Laterality: N/A;  . ESOPHAGOGASTRODUODENOSCOPY N/A 04/16/2015   Procedure: ESOPHAGOGASTRODUODENOSCOPY (EGD);  Surgeon: [name], MD;  Location: [facility];  Service: Endoscopy;  Laterality: N/A;  . EYE SURGERY    . HERNIA REPAIR    . HYSTEROSCOPY    . IMPLANTABLE CARDIOVERTER DEFIBRILLATOR (ICD) GENERATOR CHANGE N/A 04/02/2014   Procedure: ICD GENERATOR CHANGE;   Surgeon: Evans Lance, MD;  Location[facility] C[facility] LAB;  Service: Cardiovascular;  Laterality: N/A;  . INSERT / REPLACE / REMOVE PACEMAKER    . LEFT HEART CATHETERIZATION WITH CORONARY ANGIOGRAM N/A 04/02/2012   Procedure: LEFT HEART CATHETERIZATION WITH CORONARY ANGIOGRAM;  Surgeon: Hillary Bow, MD;  Location: [facility] LAB;  Service: Cardiovascular;  Laterality: N/A;  . ORIF ANKLE FRACTURE Right 08/12/2019   Procedure: OPEN REDUCTION INTERNAL FIXATION (ORIF) RIGHT TRIMALLEOLAR ANKLE FRACTURE;  Surgeon: Leandrew Koyanagi, MD;  Location: [place];  Service: Orthopedics;  Laterality: Right;  . TONSILLECTOMY    . TUBAL LIGATION      There were no vitals filed for this visit.  Subjective Assessment - 11/13/19 1057    Subjective  Pt arriving today reporting no pain.    Pertinent History  DM, restrictive lung disease, spinal stenosis, CHF, hypoxia, HTN    How long can you sit comfortably?  unlimited    How long can you stand comfortably?  10 miuntes    How long can you walk comfortably?  5 minutes, "it's hard to walk with boot on"    Diagnostic tests  X-ray    Patient Stated Goals  Walk without the boot, get back to normal    Currently in Pain?  No/denies    Pain Orientation  Right    Pain Descriptors / Indicators  Aching    Pain Type  Acute pain                       [facility] Adult PT Treatment/Exercise - 11/13/19 0001      Cryotherapy   Number Minutes Cryotherapy  10 Minutes    Cryotherapy Location  Ankle    Type of Cryotherapy  Ice pack      Vasopneumatic   Number Minutes Vasopneumatic   --    Vasopnuematic Location   --    Vasopneumatic Pressure  --    Vasopneumatic Temperature   --      Manual Therapy   Manual therapy comments  8 minutes, grade 2 and 3 mobilizations    Passive ROM  R ankle, all planes      Ankle Exercises: Standing   Rocker Board  2 minutes;Limitations    Rocker Board Limitations  2 minutes side to side    Heel Raises  Both;15 reps    Toe Raise  15  reps;Limitations    Braiding (Round Trip)  15 feet x 3 each direction    Other Standing Ankle Exercises  standing marching with UE support x 20 reps on Airex, standing hip abd x 15 on Airex    Other Standing Ankle Exercises  step ups on 4 inch step x 15       Ankle Exercises:  Stretches   Press photographer  2 reps;30 seconds;Limitations    Press photographer Limitations  standing with UE support      Ankle Exercises: Seated   BAPS  Sitting;Level 3;15 reps;Limitations               PT Short Term Goals - 11/13/19 1102      PT SHORT TERM GOAL #1   Title  Pt will be independent in her HEP.    Time  3    Period  Weeks    Status  On-going    Target Date  11/05/19      PT SHORT TERM GOAL #2   Title  Pt will be able to demonstrate correct gait sequencing with/without LRAD.    Baseline  Pt amb today with antalgic gait with decreasd df and foot clearance on R LE    Time  3    Period  Weeks    Status  On-going        PT Long Term Goals - 11/04/19 1059      PT LONG TERM GOAL #1   Title  Pt will be able to amb >/= 500 feet on community level surfaces with no device with normalized gait pattern.    Baseline  amb with cam walker using a straight cane with antalgic gait pattern    Time  6    Period  Weeks    Status  New      PT LONG TERM GOAL #2   Title  Pt will improve her R knee strength to >/= 4+/5 in order to improve functional mobility.    Baseline  see flow sheets    Time  6    Period  Weeks    Status  New      PT LONG TERM GOAL #3   Title  Pt will be able to navigate 5 steps using a single hand rail without difficutly using step over step pattern.    Baseline  step to gait pattern using assistive device and hand rail.    Period  Weeks    Status  New      PT LONG TERM GOAL #4   Title  Pt will improve her DF AROM to >/= 10 degrees bilaterally to increase her heel strike during gait.    Baseline  see flow sheets    Time  6    Period  Weeks    Status  New             Plan - 11/13/19 1058    Clinical Impression Statement  Pt arriving via transportation services over a hour before her appointment time. Pt reporting after her last visit transportation picked her up over an hour after her appointment ended. Pt reporting no pain at beginning of session. Pt tolerating more standing and blance exercises today, working on gait and equalized wt shifting. Conitnue skilled PT to progress toward goals set and pt's PLOF.    Personal Factors and Comorbidities  Comorbidity 3+    Comorbidities  COPD, CHF,HTN depression, hypoxia, spinal stenosis    Examination-Activity Limitations  Lift;Stand;Transfers;Carry;Dressing;Stairs;Squat    Examination-Participation Restrictions  Community Activity;Other;Laundry    Stability/Clinical Decision Making  Stable/Uncomplicated    Rehab Potential  Good    PT Frequency  2x / week    PT Duration  6 weeks    PT Treatment/Interventions  ADLs/Self Care Home Management;Cryotherapy;Electrical Stimulation;Iontophoresis 72m/ml Dexamethasone;Ultrasound;Gait training;Stair training;Functional mobility training;Therapeutic activities;Therapeutic exercise;Balance training;Neuromuscular re-education;Patient/family education;Manual techniques;Vasopneumatic  Device;Taping;Passive range of motion    PT Next Visit Plan  vasopneumatic, ankle ROM, strengthening, gait training, add 4 way ankle to HEP, BAPS board, add knee strength, MD appt Tuesday Feb 2    PT Home Exercise Plan  Access Code: [email or web address] (ABC's, inversion/eversion with towel, towel scrunches, DF stretch seated)    Consulted and Agree with Plan of Care  Patient      During pt's session, 2 bugs were found on pt's pants. After investigation, the bugs were identified as "bed bugs". Pt  was called after the identification was made and advised to contact an exterminator and her landlord.   Kearney Hard, PT 11/13/19 3:28 PM       Patient will benefit from skilled therapeutic  intervention in order to improve the following deficits and impairments:  Obesity, Difficulty walking, Increased edema, Decreased strength, Decreased range of motion, Decreased activity tolerance, Postural dysfunction, Pain, Abnormal gait  Visit Diagnosis: Stiffness of right ankle, not elsewhere classified  Pain in right ankle and joints of right foot  Muscle weakness (generalized)     Problem List Patient Active Problem List   Diagnosis Date Noted  . Bimalleolar ankle fracture, right, closed, initial encounter   . Trapezius muscle strain 06/19/2019  . Skin rash 07/12/2018  . Need for immunization against influenza 06/13/2018  . 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 Chronic combined systolic and diastolic congestive heart failure ([place]) 03/18/2016  . Hypoxia   . Spinal stenosis of lumbar region 12/09/2015  . Restrictive lung disease 06/24/2015  . Diabetic gastroparesis associated with type 2 diabetes mellitus ([place]) 07/12/2014  . Depression 05/22/2014  . DMII (diabetes mellitus, type 2) ([place]) 04/03/2012  . TOBACCO ABUSE 10/26/2009  . HYPERTHYROIDISM, SUBCLINICAL 05/06/2009  . Essential hypertension 05/25/2007    Oretha Caprice, PT  11/13/2019, 11:31 AM  [facility] [address] [place], [place], [address] Phone: [phone]   Fax:  [phone]  Name: DALLAS SCORSONE MRN: [id number] Date of Birth: Dec 11, 1950

## 2019-11-18 ENCOUNTER — Encounter: Payer: Self-pay | Admitting: Internal Medicine

## 2019-11-18 ENCOUNTER — Other Ambulatory Visit: Payer: Self-pay

## 2019-11-18 ENCOUNTER — Encounter: Payer: Medicare Other | Admitting: Physical Therapy

## 2019-11-18 ENCOUNTER — Ambulatory Visit (INDEPENDENT_AMBULATORY_CARE_PROVIDER_SITE_OTHER): Payer: Medicare Other | Admitting: Internal Medicine

## 2019-11-18 VITALS — BP 114/84 | HR 105 | Ht 61.0 in | Wt 198.0 lb

## 2019-11-18 DIAGNOSIS — I5042 Chronic combined systolic (congestive) and diastolic (congestive) heart failure: Secondary | ICD-10-CM

## 2019-11-18 DIAGNOSIS — Z95810 Presence of automatic (implantable) cardiac defibrillator: Secondary | ICD-10-CM | POA: Diagnosis not present

## 2019-11-18 NOTE — Patient Instructions (Addendum)
Medication Instructions:  Your physician recommends that you continue on your current medications as directed. Please refer to the Current Medication list given to you today.  Labwork: None ordered.  Testing/Procedures: None ordered.  Follow-Up: Your physician wants you to follow-up in: 3 months with device clinic.    Feb 18, 2020 at 9:30 am    Your physician wants you to follow-up in: one year with Dr. Lovena Le.  You will receive a reminder letter in the mail two months in advance. If you don't receive a letter, please call our office to schedule the follow-up appointment.  Any Other Special Instructions Will Be Listed Below (If Applicable).  If you need a refill on your cardiac medications before your next appointment, please call your pharmacy.

## 2019-11-18 NOTE — Progress Notes (Signed)
HPI [name] returns today for ongoing evaluation and management of her CHF, NICM, s/p ICD insertion. The patient has been stable in the interim. She is still smoking. She has mild peripheral edema. No syncope. No ICD shock. No chest pain.She is down to smoking 3 cigs a day and trying to stop altogether. No ICD therapies. Allergies  Allergen Reactions  . Actos [Pioglitazone] Other (See Comments)    REACTION: congestive heart failure   . Naproxen Other (See Comments)    520m dose made her sleep for two days  . Rosiglitazone Hives  . Hydrocodone-Acetaminophen Itching  . Hydrocortisone Hives, Itching and Other (See Comments)    unknown     Current Outpatient Medications  Medication Sig Dispense Refill  . albuterol (PROVENTIL) (2.5 MG/3ML) 0.083% nebulizer solution Take 3 mLs (2.5 mg total) by nebulization every 6 (six) hours as needed for wheezing or shortness of breath. 75 mL 12  . aspirin EC 81 MG tablet Take 1 tablet (81 mg total) by mouth daily. 90 tablet 3  . aspirin EC 81 MG tablet Take 1 tablet (81 mg total) by mouth 2 (two) times daily. 84 tablet 0  . atorvastatin (LIPITOR) 80 MG tablet Take 1 tablet (80 mg total) by mouth at bedtime. 90 tablet 3  . betamethasone dipropionate 0.05 % cream APPLY EXTERNALLY TO THE AFFECTED AREA TWICE DAILY 135 g 1  . Blood Glucose Monitoring Suppl (CONTOUR NEXT MONITOR) w/Device KIT 1 Act by Does not apply route 4 (four) times daily. 1 kit 0  . calcium-vitamin D (OSCAL WITH D) 500-200 MG-UNIT tablet Take 1 tablet by mouth 3 (three) times daily. 90 tablet 6  . Empagliflozin-metFORMIN HCl ER (SYNJARDY XR) 01-999 MG TB24 Take 1 tablet by mouth 2 (two) times daily with a meal. 60 tablet 3  . furosemide (LASIX) 80 MG tablet TAKE 1 TABLET BY MOUTH TWICE DAILY 180 tablet 1  . glucose blood (CONTOUR NEXT TEST) test strip Use as instructed 200 each 2  . Lancets Misc. (ACCU-CHEK FASTCLIX LANCET) KIT Lancing device, use with lancets for blood glucose  monitoring. 1 kit 0  . Lancets MISC 1 Act by Does not apply route 4 (four) times daily. 200 each 3  . lisinopril (ZESTRIL) 40 MG tablet TAKE 1 TABLET(40 MG) BY MOUTH DAILY 30 tablet 11  . metoprolol succinate (TOPROL-XL) 50 MG 24 hr tablet Take 2 tablets (100 mg total) by mouth daily. 180 tablet 2  . nitroGLYCERIN (NITROSTAT) 0.4 MG SL tablet Place 1 tablet (0.4 mg total) under the tongue every 5 (five) minutes as needed for chest pain. Reported on 03/18/2016 25 tablet 3  . ondansetron (ZOFRAN) 4 MG tablet Take 1-2 tablets (4-8 mg total) by mouth every 8 (eight) hours as needed for nausea or vomiting. 40 tablet 0  . oxycodone (OXY-IR) 5 MG capsule Take 1 capsule (5 mg total) by mouth 3 (three) times daily as needed. 30 capsule 0  . oxyCODONE-acetaminophen (PERCOCET) 5-325 MG tablet Take 1 tablet by mouth 3 (three) times daily as needed for severe pain. 30 tablet 0  . spironolactone (ALDACTONE) 25 MG tablet TAKE 0.5 TABLETS BY MOUTH EVERY DAY 45 tablet 3  . umeclidinium-vilanterol (ANORO ELLIPTA) 62.5-25 MCG/INH AEPB Inhale 1 puff into the lungs daily. 1 each 3  . VICTOZA 18 MG/3ML SOPN INJECT 1.8MG INTO THE SKIN DAILY 162 mL 1   No current facility-administered medications for this visit.     Past Medical History:  Diagnosis  Date  . ATN (acute tubular necrosis) ([place]) 07/15/2014  . Automatic implantable cardioverter-defibrillator in situ   . Automatic implantable cardioverter-defibrillator in situ 05/04/2007   Qualifier: Diagnosis of  By: Isla Pence    . Cardiac defibrillator in situ    Atlas II VR (SJM) implanted by Dr Lovena Le  . CHF (congestive heart failure) ([place])   . Chronic combined systolic and diastolic heart failure (HCC)    a. EF 35-40% in past;  b. Echo 7/13:  EF 45-50%, Gr 2 diast dysfn, mild AI, mild MAC, trivial MR, mild LAE, PASP 47.  [name] Chronic ulcer of leg ([place])    04-09-15 resolved-not a problem.  . Colon polyps 04/12/2013   Rectosigmoid polyp  . COPD (chronic  obstructive pulmonary disease) (HCC)    O2 at night  . Depression   . Diabetes mellitus   . Diabetes mellitus, type 2 ([place]) 04/03/2012   HIGH RISK FEET.. Please have patient take shoes and socks off every visit for visual foot inspection.    . Eczema   . Elevated alkaline phosphatase level    GGT and 5'nucleotidase 8/13 normal  . Health care maintenance 01/22/2013   Surgically absent cervix- no pap needed (Path report 07/2000: uterine body with attached bilateral adnexa and separate cervix.)  . History of oxygen administration    oxygen @ 2 l/m nasally bedtime 24/7  . HTN (hypertension)   . Hx of cardiac cath    a. [place] 2003 normal;  b. LHC 6/13:  Mild calcification in the LM, o/w normal coronary arteries, EF 45%.   . Hyperkalemia 08/08/2017  . Hyperlipidemia   . Hyperlipidemia associated with type 2 diabetes mellitus ([place]) 05/25/2007   Qualifier: Diagnosis of  By: Hassell Done FNP, Tori Milks    . Hyperthyroidism, subclinical   . Implantable cardioverter-defibrillator (ICD) generator end of life   . Lipoma   . Need for hepatitis C screening test 04/25/2019  . NICM (nonischemic cardiomyopathy) ([place])   . Obesity   . On home oxygen therapy    "2L; 24/7" (10/11/2016)  . Post menopausal syndrome   . Shortness of breath 07/14/2017  . Skin rash 07/12/2018  . Sleep apnea    pt denies 04/12/2013    ROS:   All systems reviewed and negative except as noted in the HPI.   Past Surgical History:  Procedure Laterality Date  . ABDOMINAL HYSTERECTOMY    . CARDIAC CATHETERIZATION    . CARDIAC CATHETERIZATION N/A 06/21/2016   Procedure: Left Heart Cath and Coronary Angiography;  Surgeon: Sherren Mocha, MD;  Location: [place] CV LAB;  Service: Cardiovascular;  Laterality: N/A;  . CARDIAC DEFIBRILLATOR PLACEMENT  05/04/2007   SJM Atlas II VR ICD  . CARDIAC DEFIBRILLATOR PLACEMENT    . CATARACT EXTRACTION     od  . COLONOSCOPY N/A 04/12/2013   Procedure: COLONOSCOPY;  Surgeon: Beryle Beams, MD;   [facility];  Service: Endoscopy;  Laterality: N/A;  pt.has defibrilator  . COLONOSCOPY N/A 04/16/2015   Procedure: COLONOSCOPY;  Surgeon: [name], MD;  [facility];  Service: Endoscopy;  Laterality: N/A;  . ESOPHAGOGASTRODUODENOSCOPY N/A 04/16/2015   Procedure: ESOPHAGOGASTRODUODENOSCOPY (EGD);  Surgeon: [name], MD;  Location: [facility];  Service: Endoscopy;  Laterality: N/A;  . EYE SURGERY    . HERNIA REPAIR    . HYSTEROSCOPY    . IMPLANTABLE CARDIOVERTER DEFIBRILLATOR (ICD) GENERATOR CHANGE N/A 04/02/2014   Procedure: ICD GENERATOR CHANGE;  Surgeon: Evans Lance, MD;  Location: [facility] CATH LAB;  Service: Cardiovascular;  Laterality: N/A;  . INSERT / REPLACE / REMOVE PACEMAKER    . LEFT HEART CATHETERIZATION WITH CORONARY ANGIOGRAM N/A 04/02/2012   Procedure: LEFT HEART CATHETERIZATION WITH CORONARY ANGIOGRAM;  Surgeon: Hillary Bow, MD;  Location: [facility] CATH LAB;  Service: Cardiovascular;  Laterality: N/A;  . ORIF ANKLE FRACTURE Right 08/12/2019   Procedure: OPEN REDUCTION INTERNAL FIXATION (ORIF) RIGHT TRIMALLEOLAR ANKLE FRACTURE;  Surgeon: Leandrew Koyanagi, MD;  Location: [place];  Service: Orthopedics;  Laterality: Right;  . TONSILLECTOMY    . TUBAL LIGATION       Family History  Problem Relation Age of Onset  . Stroke Mother   . Seizures Father   . Heart disease Father   . Diabetes Sister   . Asthma Maternal Aunt        aunts  . Asthma Maternal Uncle        uncles  . Heart disease Paternal Aunt        aunts  . Heart disease Paternal Uncle        uncles  . Heart disease Maternal Aunt        aunts  . Heart disease Maternal Uncle        uncles  . Heart disease Maternal Grandfather   . Colon cancer Neg Hx   . Colon polyps Neg Hx   . Esophageal cancer Neg Hx   . Kidney disease Neg Hx   . Gallbladder disease Neg Hx      Social History   Socioeconomic History  . Marital status: Widowed    Spouse name: Alroy Dust  . Number of children: 3    . Years of education: 76  . Highest education level: Not on file  Occupational History  . Occupation: disabled  Tobacco Use  . Smoking status: Current Every Day Smoker    Packs/day: 0.10    Years: 39.00    Pack years: 3.90    Types: Cigarettes  . Smokeless tobacco: Never Used  . Tobacco comment: 2-3 cig/day.  Substance and Sexual Activity  . Alcohol use: No    Alcohol/week: 1.0 standard drinks    Types: 1 Glasses of wine per week  . Drug use: No  . Sexual activity: Not Currently  Other Topics Concern  . Not on file  Social History Narrative   ** Merged History Encounter **       Married   Social Determinants of Health   Financial Resource Strain:   . Difficulty of Paying Living Expenses: Not on file  Food Insecurity:   . Worried About Charity fundraiser in the Last Year: Not on file  . Ran Out of Food in the Last Year: Not on file  Transportation Needs:   . Lack of Transportation (Medical): Not on file  . Lack of Transportation (Non-Medical): Not on file  Physical Activity:   . Days of Exercise per Week: Not on file  . Minutes of Exercise per Session: Not on file  Stress:   . Feeling of Stress : Not on file  Social Connections:   . Frequency of Communication with Friends and Family: Not on file  . Frequency of Social Gatherings with Friends and Family: Not on file  . Attends Religious Services: Not on file  . Active Member of Clubs or Organizations: Not on file  . Attends Archivist Meetings: Not on file  . Marital Status: Not on file  Intimate Partner Violence:   .  Fear of Current or Ex-Partner: Not on file  . Emotionally Abused: Not on file  . Physically Abused: Not on file  . Sexually Abused: Not on file     BP 114/84   Pulse (!) 105   Ht _0  (1.549 m)   Wt 198 lb (89.8 kg)   SpO2 97%   BMI 37.41 kg/m   Physical Exam:  Well appearing NAD HEENT: Unremarkable Neck:  No JVD, no thyromegally Lymphatics:  No adenopathy Back:  No CVA  tenderness Lungs:  Clear with no wheezes HEART:  Regular rate rhythm, no murmurs, no rubs, no clicks Abd:  soft, positive bowel sounds, no organomegally, no rebound, no guarding Ext:  2 plus pulses, no edema, no cyanosis, no clubbing Skin:  No rashes no nodules Neuro:  CN II through XII intact, motor grossly intact  DEVICE  Normal device function.  See PaceArt for details.   Assess/Plan: 1. Chronic systolic heart failure - her symptoms are class 2. She will continue her current meds. 2. Tobacco abuse - I strongly encouraged the patient to maintain a low sodium diet.  3. COPD - I encouraged her to stop her smoking. 4. ICD - her [place] ICD is working normally. We will recheck in several months.  Mikle Bosworth.D.

## 2019-11-20 ENCOUNTER — Encounter: Payer: Self-pay | Admitting: Physical Therapy

## 2019-11-20 ENCOUNTER — Other Ambulatory Visit: Payer: Self-pay

## 2019-11-20 ENCOUNTER — Ambulatory Visit: Payer: Medicare Other | Admitting: Physical Therapy

## 2019-11-20 DIAGNOSIS — M25571 Pain in right ankle and joints of right foot: Secondary | ICD-10-CM

## 2019-11-20 DIAGNOSIS — M25671 Stiffness of right ankle, not elsewhere classified: Secondary | ICD-10-CM

## 2019-11-20 DIAGNOSIS — R2689 Other abnormalities of gait and mobility: Secondary | ICD-10-CM

## 2019-11-20 DIAGNOSIS — M6281 Muscle weakness (generalized): Secondary | ICD-10-CM | POA: Diagnosis not present

## 2019-11-20 DIAGNOSIS — R600 Localized edema: Secondary | ICD-10-CM | POA: Diagnosis not present

## 2019-11-20 NOTE — Therapy (Signed)
[place], [place], [address] Phone: [phone]   Fax:  [phone]  Physical Therapy Treatment Discharge Summary   Patient Details  Name: [name] MRN: [id number] Date of Birth: October 27, 1950 Referring Provider (PT): Frankey Shown MD   Encounter Date: 11/20/2019  PT End of Session - 11/20/19 1100    Visit Number  10    Number of Visits  13    Date for PT Re-Evaluation  11/25/19    Authorization Type  UHC/Medicaid    Activity Tolerance  Patient tolerated treatment well    Behavior During Therapy  [facility] for tasks assessed/performed       Past Medical History:  Diagnosis Date  . ATN (acute tubular necrosis) ([place]) 07/15/2014  . Automatic implantable cardioverter-defibrillator in situ   . Automatic implantable cardioverter-defibrillator in situ 05/04/2007   Qualifier: Diagnosis of  By: Isla Pence    . Cardiac defibrillator in situ    Atlas II VR (SJM) implanted by Dr Lovena Le  . CHF (congestive heart failure) ([place])   . Chronic combined systolic and diastolic heart failure (HCC)    a. EF 35-40% in past;  b. Echo 7/13:  EF 45-50%, Gr 2 diast dysfn, mild AI, mild MAC, trivial MR, mild LAE, PASP 47.  [name] Chronic ulcer of leg ([place])    04-09-15 resolved-not a problem.  . Colon polyps 04/12/2013   Rectosigmoid polyp  . COPD (chronic obstructive pulmonary disease) (HCC)    O2 at night  . Depression   . Diabetes mellitus   . Diabetes mellitus, type 2 ([place]) 04/03/2012   HIGH RISK FEET.. Please have patient take shoes and socks off every visit for visual foot inspection.    . Eczema   . Elevated alkaline phosphatase level    GGT and 5'nucleotidase 8/13 normal  . Health care maintenance 01/22/2013   Surgically absent cervix- no pap needed (Path report 07/2000: uterine body with attached bilateral adnexa and separate cervix.)  . History of oxygen administration    oxygen @ 2 l/m nasally bedtime 24/7  . HTN  (hypertension)   . Hx of cardiac cath    a. [place] 2003 normal;  b. LHC 6/13:  Mild calcification in the LM, o/w normal coronary arteries, EF 45%.   . Hyperkalemia 08/08/2017  . Hyperlipidemia   . Hyperlipidemia associated with type 2 diabetes mellitus ([place]) 05/25/2007   Qualifier: Diagnosis of  By: Hassell Done FNP, Tori Milks    . Hyperthyroidism, subclinical   . Implantable cardioverter-defibrillator (ICD) generator end of life   . Lipoma   . Need for hepatitis C screening test 04/25/2019  . NICM (nonischemic cardiomyopathy) ([place])   . Obesity   . On home oxygen therapy    "2L; 24/7" (10/11/2016)  . Post menopausal syndrome   . Shortness of breath 07/14/2017  . Skin rash 07/12/2018  . Sleep apnea    pt denies 04/12/2013    Past Surgical History:  Procedure Laterality Date  . ABDOMINAL HYSTERECTOMY    . CARDIAC CATHETERIZATION    . CARDIAC CATHETERIZATION N/A 06/21/2016   Procedure: Left Heart Cath and Coronary Angiography;  Surgeon: Sherren Mocha, MD;  Location: [place] [facility];  Service: Cardiovascular;  Laterality: N/A;  . CARDIAC DEFIBRILLATOR PLACEMENT  05/04/2007   SJM Atlas II VR ICD  . CARDIAC DEFIBRILLATOR PLACEMENT    . CATARACT EXTRACTION     od  . COLONOSCOPY N/A 04/12/2013   Procedure: COLONOSCOPY;  Surgeon: Tory Emerald  Benson Norway, MD;  Location: [facility];  Service: Endoscopy;  Laterality: N/A;  pt.has defibrilator  . COLONOSCOPY N/A 04/16/2015   Procedure: COLONOSCOPY;  Surgeon: [name], MD;  Location: [facility];  Service: Endoscopy;  Laterality: N/A;  . ESOPHAGOGASTRODUODENOSCOPY N/A 04/16/2015   Procedure: ESOPHAGOGASTRODUODENOSCOPY (EGD);  Surgeon: [name], MD;  Location: [facility];  Service: Endoscopy;  Laterality: N/A;  . EYE SURGERY    . HERNIA REPAIR    . HYSTEROSCOPY    . IMPLANTABLE CARDIOVERTER DEFIBRILLATOR (ICD) GENERATOR CHANGE N/A 04/02/2014   Procedure: ICD GENERATOR CHANGE;  Surgeon: Evans Lance, MD;  Location: [facility] CATH LAB;  Service:  Cardiovascular;  Laterality: N/A;  . INSERT / REPLACE / REMOVE PACEMAKER    . LEFT HEART CATHETERIZATION WITH CORONARY ANGIOGRAM N/A 04/02/2012   Procedure: LEFT HEART CATHETERIZATION WITH CORONARY ANGIOGRAM;  Surgeon: Hillary Bow, MD;  Location: [facility] CATH LAB;  Service: Cardiovascular;  Laterality: N/A;  . ORIF ANKLE FRACTURE Right 08/12/2019   Procedure: OPEN REDUCTION INTERNAL FIXATION (ORIF) RIGHT TRIMALLEOLAR ANKLE FRACTURE;  Surgeon: Leandrew Koyanagi, MD;  Location: [facility];  Service: Orthopedics;  Laterality: Right;  . TONSILLECTOMY    . TUBAL LIGATION      There were no vitals filed for this visit.  Subjective Assessment - 11/20/19 1206    Subjective  pt arriving today reporting no pain in her R ankle.    Pertinent History  DM, restrictive lung disease, spinal stenosis, CHF, hypoxia, HTN    How long can you sit comfortably?  unlimited    How long can you stand comfortably?  30 minutes    How long can you walk comfortably?  30 minutes," I have been going to church and the store"    Diagnostic tests  X-ray    Patient Stated Goals  Walk without the boot, get back to normal    Currently in Pain?  No/denies         [facility] PT Assessment - 11/20/19 0001      Assessment   Medical Diagnosis  R ankle trimalleolar fx s/p ORIFon 08/12/2019    Referring Provider (PT)  Frankey Shown MD    Onset Date/Surgical Date  08/12/19    Hand Dominance  Right    Next MD Visit  4 weeks     Prior Therapy  no      Restrictions   Weight Bearing Restrictions  Yes    RLE Weight Bearing  Weight bearing as tolerated      AROM   AROM Assessment Site  Ankle    Right/Left Ankle  Right    Right Ankle Dorsiflexion  8    Right Ankle Plantar Flexion  15    Right Ankle Inversion  30    Right Ankle Eversion  22    Left Ankle Dorsiflexion  10    Left Ankle Plantar Flexion  30    Left Ankle Inversion  28    Left Ankle Eversion  22      Strength   Right Ankle Dorsiflexion  4+/5    Right Ankle Plantar Flexion   4+/5    Right Ankle Inversion  4+/5                           PT Education - 11/20/19 1208    Education Details  Reviewed and updated pt's HEP with standing exericses and increased resistance bands    Person(s) Educated  Patient    Methods  Explanation;Demonstration    Comprehension  Verbalized understanding;Returned demonstration       PT Short Term Goals - 11/20/19 1105      PT SHORT TERM GOAL #1   Title  Pt will be independent in her HEP.    Time  3    Period  Weeks    Status  Achieved    Target Date  11/05/19      PT SHORT TERM GOAL #2   Title  Pt will be able to demonstrate correct gait sequencing with/without LRAD.    Baseline  Pt still with mild antalgic gait with no assistive device.    Time  3    Period  Weeks    Status  Partially Met        PT Long Term Goals - 11/20/19 1105      PT LONG TERM GOAL #1   Title  Pt will be able to amb >/= 500 feet on community level surfaces with no device with normalized gait pattern.    Baseline  pt amb with mild antaglic gait pattern no assitive device. Still progressing toward more normalized gait pattern.    Time  6    Period  Weeks    Status  Partially Met      PT LONG TERM GOAL #2   Title  Pt will improve her R knee strength to >/= 4+/5 in order to improve functional mobility.    Baseline  see flow sheets    Time  6    Period  Weeks    Status  Achieved      PT LONG TERM GOAL #3   Title  Pt will be able to navigate 5 steps using a single hand rail without difficutly using step over step pattern.    Baseline  step through gait pattern using hand rail.    Period  Weeks    Status  Achieved      PT LONG TERM GOAL #4   Title  Pt will improve her DF AROM to >/= 10 degrees bilaterally to increase her heel strike during gait.    Baseline  R ankle: 8 degrees    Time  6    Period  Weeks    Status  Partially Met            Plan - 11/20/19 1210    Clinical Impression Statement  Pt arriving to  therapy reporting no pain. Pt amb with mild antalgic gait pattern. Pt reporting that she is unable to make her scheduled visit next week therefore we discussed discharging pt to continue her HEP and progression. Pt's HEP was reviewed and increase resitance bands were issued. Pt progressing with gait with no assistive device and with standing and balance activities. Pt improved her R ankle AROM (see flow sheets). Pt has met 50% of her initial goals set and partially met the others. Pt will be discharged from PT services today.    Personal Factors and Comorbidities  Comorbidity 3+    Comorbidities  COPD, CHF,HTN depression, hypoxia, spinal stenosis    Examination-Activity Limitations  Lift;Stand;Transfers;Carry;Dressing;Stairs;Squat    Examination-Participation Restrictions  Community Activity;Other;Laundry    Stability/Clinical Decision Making  Stable/Uncomplicated    Rehab Potential  Good    PT Frequency  2x / week    PT Duration  6 weeks    PT Treatment/Interventions  ADLs/Self Care Home Management;Cryotherapy;Electrical Stimulation;Iontophoresis 54m/ml Dexamethasone;Ultrasound;Gait training;Stair training;Functional mobility training;Therapeutic activities;Therapeutic exercise;Balance training;Neuromuscular  re-education;Patient/family education;Manual techniques;Vasopneumatic Device;Taping;Passive range of motion    PT Next Visit Plan  vasopneumatic, ankle ROM, strengthening, gait training, add 4 way ankle to HEP, BAPS board, add knee strength, MD appt Tuesday Feb 2    PT Home Exercise Plan  Access Code: [email or web address] (ABC's, inversion/eversion with towel, towel scrunches, DF stretch seated)    Consulted and Agree with Plan of Care  Patient       Patient will benefit from skilled therapeutic intervention in order to improve the following deficits and impairments:  Obesity, Difficulty walking, Increased edema, Decreased strength, Decreased range of motion, Decreased activity tolerance, Postural  dysfunction, Pain, Abnormal gait  Visit Diagnosis: Stiffness of right ankle, not elsewhere classified  Pain in right ankle and joints of right foot  Muscle weakness (generalized)  Localized edema  Other abnormalities of gait and mobility     Problem List Patient Active Problem List   Diagnosis Date Noted  . Bimalleolar ankle fracture, right, closed, initial encounter   . Trapezius muscle strain 06/19/2019  . Skin rash 07/12/2018  . Need for immunization against influenza 06/13/2018  . 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 Chronic combined systolic and diastolic congestive heart failure ([place]) 03/18/2016  . Hypoxia   . Spinal stenosis of lumbar region 12/09/2015  . Restrictive lung disease 06/24/2015  . Diabetic gastroparesis associated with type 2 diabetes mellitus ([place]) 07/12/2014  . Depression 05/22/2014  . DMII (diabetes mellitus, type 2) ([place]) 04/03/2012  . TOBACCO ABUSE 10/26/2009  . HYPERTHYROIDISM, SUBCLINICAL 05/06/2009  . Essential hypertension 05/25/2007  . ICD (implantable cardioverter-defibrillator) in place 05/04/2007   PHYSICAL THERAPY DISCHARGE SUMMARY  Visits from Start of Care: 10  Current functional level related to goals / functional outcomes: See goals   Remaining deficits: See goals   Education / Equipment: HEP, elevation and ice for edema Plan: Patient agrees to discharge.  Patient goals were partially met. Patient is being discharged due to being pleased with the current functional level.  ?????      Oretha Caprice, PT 11/20/2019, 12:18 PM  [facility] [address] [place], [place], [address] Phone: [phone]   Fax:  [phone]  Name: [name] MRN: [id number] Date of Birth: 1951/06/27

## 2019-11-21 ENCOUNTER — Ambulatory Visit: Payer: Medicare Other

## 2019-11-25 ENCOUNTER — Ambulatory Visit: Payer: Medicare Other | Admitting: Physical Therapy

## 2019-11-25 ENCOUNTER — Ambulatory Visit: Payer: Medicare Other | Attending: Family

## 2019-11-25 DIAGNOSIS — Z23 Encounter for immunization: Secondary | ICD-10-CM | POA: Insufficient documentation

## 2019-11-25 LAB — CUP PACEART INCLINIC DEVICE CHECK
Brady Statistic RV Percent Paced: 0 %
Date Time Interrogation Session: 20210215093240
Implantable Lead Implant Date: 20080801
Implantable Lead Location: 753860
Implantable Lead Model: 7120
Implantable Pulse Generator Implant Date: 20150701
Lead Channel Pacing Threshold Amplitude: 0.5 V
Lead Channel Pacing Threshold Pulse Width: 0.5 ms
Lead Channel Sensing Intrinsic Amplitude: 11.9 mV
Pulse Gen Serial Number: 7206538

## 2019-11-25 NOTE — Progress Notes (Signed)
   Covid-19 Vaccination Clinic  Name:  [name]    MRN: [id number] DOB: [birth year]  11/25/2019  [name] was observed post Covid-19 immunization for 15 minutes without incidence. She was provided with Vaccine Information Sheet and instruction to access the V-Safe system.   [name] was instructed to call 911 with any severe reactions post vaccine: [name] Difficulty breathing  . Swelling of your face and throat  . A fast heartbeat  . A bad rash all over your body  . Dizziness and weakness    Immunizations Administered    Name Date Dose VIS Date Route   Moderna COVID-19 Vaccine 11/25/2019  1:28 PM 0.5 mL 09/03/2019 Intramuscular   Manufacturer: Moderna   Lot: [id number]   [place]: [id number]

## 2019-11-27 ENCOUNTER — Ambulatory Visit: Payer: Medicare Other | Admitting: Physical Therapy

## 2019-11-30 DIAGNOSIS — J449 Chronic obstructive pulmonary disease, unspecified: Secondary | ICD-10-CM | POA: Diagnosis not present

## 2019-11-30 DIAGNOSIS — I509 Heart failure, unspecified: Secondary | ICD-10-CM | POA: Diagnosis not present

## 2019-12-03 ENCOUNTER — Ambulatory Visit: Payer: Self-pay

## 2019-12-03 ENCOUNTER — Other Ambulatory Visit: Payer: Self-pay

## 2019-12-03 ENCOUNTER — Encounter: Payer: Self-pay | Admitting: Orthopaedic Surgery

## 2019-12-03 ENCOUNTER — Ambulatory Visit (INDEPENDENT_AMBULATORY_CARE_PROVIDER_SITE_OTHER): Payer: Medicare Other | Admitting: Orthopaedic Surgery

## 2019-12-03 VITALS — Ht 61.0 in | Wt 198.0 lb

## 2019-12-03 DIAGNOSIS — S82851A Displaced trimalleolar fracture of right lower leg, initial encounter for closed fracture: Secondary | ICD-10-CM | POA: Diagnosis not present

## 2019-12-03 NOTE — Progress Notes (Signed)
Office Visit Note   Patient: [name]           Date of Birth: March 19, 1951           MRN: [id number] Visit Date: 12/03/2019              Requested by: Kathi Ludwig, MD [place],  [place] [address] PCP: Kathi Ludwig, MD   Assessment & Plan: Visit Diagnoses:  1. Closed displaced trimalleolar fracture of right ankle, initial encounter     Plan: Impression is status post right trimalleolar ankle fracture.  At this point her fractures have demonstrated full healing.  She is ambulating well.  She may wean the ASO brace as tolerated.  Increase activity as tolerated.  Follow-up as needed.  Follow-Up Instructions: Return if symptoms worsen or fail to improve.   Orders:  Orders Placed This Encounter  Procedures  . XR Ankle Complete Right   No orders of the defined types were placed in this encounter.     Procedures: No procedures performed   Clinical Data: No additional findings.   Subjective: Chief Complaint  Patient presents with  . Right Ankle - Follow-up    ORIF bimall fracture DOS 08/12/2019    [name] is 3 months and 21-day status post right trimalleolar ankle fracture.  She is doing well and not taking any pain medications.  She is wearing an ASO brace for ambulation.  She has completed physical therapy.  She has no real complaints.   Review of Systems   Objective: Vital Signs: Ht _0  (1.549 m)   Wt 198 lb (89.8 kg)   BMI 37.41 kg/m   Physical Exam  Ortho Exam Right ankle exam shows fully healed surgical scars.  Mild swelling.  Mild restriction range of motion without pain. Specialty Comments:  No specialty comments available.  Imaging: XR Ankle Complete Right  Result Date: 12/03/2019 Stable ORIF right ankle fracture.  Fractures appear to be fully healed.  No hardware complications.    PMFS History: Patient Active Problem List   Diagnosis Date Noted  . Bimalleolar ankle fracture, right, closed, initial encounter   .  Trapezius muscle strain 06/19/2019  . Skin rash 07/12/2018  . Need for immunization against influenza 06/13/2018  . 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 Chronic combined systolic and diastolic congestive heart failure ([place]) 03/18/2016  . Hypoxia   . Spinal stenosis of lumbar region 12/09/2015  . Restrictive lung disease 06/24/2015  . Diabetic gastroparesis associated with type 2 diabetes mellitus ([place]) 07/12/2014  . Depression 05/22/2014  . DMII (diabetes mellitus, type 2) ([place]) 04/03/2012  . TOBACCO ABUSE 10/26/2009  . HYPERTHYROIDISM, SUBCLINICAL 05/06/2009  . Essential hypertension 05/25/2007  . ICD (implantable cardioverter-defibrillator) in place 05/04/2007   Past Medical History:  Diagnosis Date  . ATN (acute tubular necrosis) ([place]) 07/15/2014  . Automatic implantable cardioverter-defibrillator in situ   . Automatic implantable cardioverter-defibrillator in situ 05/04/2007   Qualifier: Diagnosis of  By: Isla Pence    . Cardiac defibrillator in situ    Atlas II VR (SJM) implanted by Dr Lovena Le  . CHF (congestive heart failure) ([place])   . Chronic combined systolic and diastolic heart failure (HCC)    a. EF 35-40% in past;  b. Echo 7/13:  EF 45-50%, Gr 2 diast dysfn, mild AI, mild MAC, trivial MR, mild LAE, PASP 47.  [name] Chronic ulcer of leg ([place])    04-09-15 resolved-not a problem.  . Colon polyps 04/12/2013  Rectosigmoid polyp  . COPD (chronic obstructive pulmonary disease) (HCC)    O2 at night  . Depression   . Diabetes mellitus   . Diabetes mellitus, type 2 ([place]) 04/03/2012   HIGH RISK FEET.. Please have patient take shoes and socks off every visit for visual foot inspection.    . Eczema   . Elevated alkaline phosphatase level    GGT and 5'nucleotidase 8/13 normal  . Health care maintenance 01/22/2013   Surgically absent cervix- no pap needed (Path report 07/2000: uterine body with attached bilateral adnexa and separate cervix.)  . History of oxygen  administration    oxygen @ 2 l/m nasally bedtime 24/7  . HTN (hypertension)   . Hx of cardiac cath    a. [place] 2003 normal;  b. LHC 6/13:  Mild calcification in the LM, o/w normal coronary arteries, EF 45%.   . Hyperkalemia 08/08/2017  . Hyperlipidemia   . Hyperlipidemia associated with type 2 diabetes mellitus ([place]) 05/25/2007   Qualifier: Diagnosis of  By: Hassell Done FNP, Tori Milks    . Hyperthyroidism, subclinical   . Implantable cardioverter-defibrillator (ICD) generator end of life   . Lipoma   . Need for hepatitis C screening test 04/25/2019  . NICM (nonischemic cardiomyopathy) ([place])   . Obesity   . On home oxygen therapy    "2L; 24/7" (10/11/2016)  . Post menopausal syndrome   . Shortness of breath 07/14/2017  . Skin rash 07/12/2018  . Sleep apnea    pt denies 04/12/2013    Family History  Problem Relation Age of Onset  . Stroke Mother   . Seizures Father   . Heart disease Father   . Diabetes Sister   . Asthma Maternal Aunt        aunts  . Asthma Maternal Uncle        uncles  . Heart disease Paternal Aunt        aunts  . Heart disease Paternal Uncle        uncles  . Heart disease Maternal Aunt        aunts  . Heart disease Maternal Uncle        uncles  . Heart disease Maternal Grandfather   . Colon cancer Neg Hx   . Colon polyps Neg Hx   . Esophageal cancer Neg Hx   . Kidney disease Neg Hx   . Gallbladder disease Neg Hx     Past Surgical History:  Procedure Laterality Date  . ABDOMINAL HYSTERECTOMY    . CARDIAC CATHETERIZATION    . CARDIAC CATHETERIZATION N/A 06/21/2016   Procedure: Left Heart Cath and Coronary Angiography;  Surgeon: Sherren Mocha, MD;  Location: [place] CV LAB;  Service: Cardiovascular;  Laterality: N/A;  . CARDIAC DEFIBRILLATOR PLACEMENT  05/04/2007   SJM Atlas II VR ICD  . CARDIAC DEFIBRILLATOR PLACEMENT    . CATARACT EXTRACTION     od  . COLONOSCOPY N/A 04/12/2013   Procedure: COLONOSCOPY;  Surgeon: Beryle Beams, MD;  [facility]  ENDOSCOPY;  Service: Endoscopy;  Laterality: N/A;  pt.has defibrilator  . COLONOSCOPY N/A 04/16/2015   Procedure: COLONOSCOPY;  Surgeon: [name], MD;  Location[facility];  Service: Endoscopy;  Laterality: N/A;  . ESOPHAGOGASTRODUODENOSCOPY N/A 04/16/2015   Procedure: ESOPHAGOGASTRODUODENOSCOPY (EGD);  Surgeon: [name], MD;  Location: [facility];  Service: Endoscopy;  Laterality: N/A;  . EYE SURGERY    . HERNIA REPAIR    . HYSTEROSCOPY    . IMPLANTABLE CARDIOVERTER  DEFIBRILLATOR (ICD) GENERATOR CHANGE N/A 04/02/2014   Procedure: ICD GENERATOR CHANGE;  Surgeon: Evans Lance, MD;  Location: [facility] CATH LAB;  Service: Cardiovascular;  Laterality: N/A;  . INSERT / REPLACE / REMOVE PACEMAKER    . LEFT HEART CATHETERIZATION WITH CORONARY ANGIOGRAM N/A 04/02/2012   Procedure: LEFT HEART CATHETERIZATION WITH CORONARY ANGIOGRAM;  Surgeon: Hillary Bow, MD;  Location: [facility] CATH LAB;  Service: Cardiovascular;  Laterality: N/A;  . ORIF ANKLE FRACTURE Right 08/12/2019   Procedure: OPEN REDUCTION INTERNAL FIXATION (ORIF) RIGHT TRIMALLEOLAR ANKLE FRACTURE;  Surgeon: Leandrew Koyanagi, MD;  Location: [facility];  Service: Orthopedics;  Laterality: Right;  . TONSILLECTOMY    . TUBAL LIGATION     Social History   Occupational History  . Occupation: disabled  Tobacco Use  . Smoking status: Current Every Day Smoker    Packs/day: 0.10    Years: 39.00    Pack years: 3.90    Types: Cigarettes  . Smokeless tobacco: Never Used  . Tobacco comment: 2-3 cig/day.  Substance and Sexual Activity  . Alcohol use: No    Alcohol/week: 1.0 standard drinks    Types: 1 Glasses of wine per week  . Drug use: No  . Sexual activity: Not Currently

## 2019-12-26 ENCOUNTER — Other Ambulatory Visit: Payer: Self-pay

## 2019-12-26 ENCOUNTER — Encounter: Payer: Self-pay | Admitting: Internal Medicine

## 2019-12-26 ENCOUNTER — Ambulatory Visit (INDEPENDENT_AMBULATORY_CARE_PROVIDER_SITE_OTHER): Payer: Medicare Other | Admitting: Internal Medicine

## 2019-12-26 VITALS — BP 122/58 | HR 89 | Temp 97.7°F | Ht 62.0 in | Wt 197.2 lb

## 2019-12-26 DIAGNOSIS — E785 Hyperlipidemia, unspecified: Secondary | ICD-10-CM

## 2019-12-26 DIAGNOSIS — Z7984 Long term (current) use of oral hypoglycemic drugs: Secondary | ICD-10-CM

## 2019-12-26 DIAGNOSIS — L308 Other specified dermatitis: Secondary | ICD-10-CM | POA: Diagnosis not present

## 2019-12-26 DIAGNOSIS — E1142 Type 2 diabetes mellitus with diabetic polyneuropathy: Secondary | ICD-10-CM | POA: Diagnosis not present

## 2019-12-26 DIAGNOSIS — I10 Essential (primary) hypertension: Secondary | ICD-10-CM

## 2019-12-26 DIAGNOSIS — E119 Type 2 diabetes mellitus without complications: Secondary | ICD-10-CM | POA: Diagnosis not present

## 2019-12-26 DIAGNOSIS — I5042 Chronic combined systolic (congestive) and diastolic (congestive) heart failure: Secondary | ICD-10-CM | POA: Diagnosis not present

## 2019-12-26 DIAGNOSIS — I110 Hypertensive heart disease with heart failure: Secondary | ICD-10-CM | POA: Diagnosis not present

## 2019-12-26 DIAGNOSIS — E1169 Type 2 diabetes mellitus with other specified complication: Secondary | ICD-10-CM

## 2019-12-26 DIAGNOSIS — Z0000 Encounter for general adult medical examination without abnormal findings: Secondary | ICD-10-CM | POA: Insufficient documentation

## 2019-12-26 DIAGNOSIS — L408 Other psoriasis: Secondary | ICD-10-CM

## 2019-12-26 DIAGNOSIS — Z79899 Other long term (current) drug therapy: Secondary | ICD-10-CM

## 2019-12-26 LAB — POCT GLYCOSYLATED HEMOGLOBIN (HGB A1C): Hemoglobin A1C: 9.6 % — AB (ref 4.0–5.6)

## 2019-12-26 LAB — GLUCOSE, CAPILLARY: Glucose-Capillary: 162 mg/dL — ABNORMAL HIGH (ref 70–99)

## 2019-12-26 MED ORDER — METOPROLOL SUCCINATE ER 50 MG PO TB24: 100.0000 mg | ORAL_TABLET | Freq: Every day | ORAL | 3 refills | Status: DC

## 2019-12-26 MED ORDER — SYNJARDY 12.5-500 MG PO TABS: 1.0000 | ORAL_TABLET | Freq: Two times a day (BID) | ORAL | 1 refills | Status: DC

## 2019-12-26 MED ORDER — FUROSEMIDE 80 MG PO TABS: 80.0000 mg | ORAL_TABLET | Freq: Two times a day (BID) | ORAL | 3 refills | Status: DC

## 2019-12-26 MED ORDER — ATORVASTATIN CALCIUM 80 MG PO TABS: 80.0000 mg | ORAL_TABLET | Freq: Every day | ORAL | 3 refills | Status: DC

## 2019-12-26 MED ORDER — SPIRONOLACTONE 25 MG PO TABS: ORAL_TABLET | ORAL | 3 refills | Status: DC

## 2019-12-26 MED ORDER — VICTOZA 18 MG/3ML ~~LOC~~ SOPN: PEN_INJECTOR | SUBCUTANEOUS | 3 refills | Status: DC

## 2019-12-26 NOTE — Assessment & Plan Note (Signed)
Hypertension: Patient's BP today is 122/58 with a goal of <140/80. The patient endorses adherence to her medication regimen. She denied, chest pain, headache, visual changes, lightheadedness, weakness, dizziness on standing, swelling in the feet or ankles.   Plan: Continue lisinopril 40mg  daily Continue metoprolol 100mg  daily Continue spironolactone 25mg  BID BMP today

## 2019-12-26 NOTE — Assessment & Plan Note (Signed)
COVID: Second vaccine due March 30th  Health Maintenance History: Needs to call ophthalmology.

## 2019-12-26 NOTE — Assessment & Plan Note (Signed)
DMII: Hgb A1c 9.1 % at the last visit. Current A1c 9.6%. The patient denied polyuria, polydipsia, headache, fatigue, confusion, nausea, vomiting or diaphoresis. She has not been taking her Synjardy BID due to loose stool about 4 months prior. She is agreeable to decreasing the Synjardy Metformin component to 500 BID and to see if this is tolerable.   Plan: Continue Synjardy but at 12.5-500 BID Continue Victoza 1.8mg  daily Repeat A1c at next visit

## 2019-12-26 NOTE — Patient Instructions (Signed)
FOLLOW-UP INSTRUCTIONS When: 2-3 months For: For a routine visit What to bring: All of your medications  I have not made any changes to your medications today.   Today we discussed your heart failure, HTN, diabetes and skin rash. I will notify you of the results of any labs from today's evaluation when available to me.   Please notify us right away if your new diabetes medications cause your issues such as loose stool. If loose stool occurs please attempt to give them 1-2 weeks to see if the loose stool resolves.   Thank you for your visit to the [facility] today. If you have any questions or concerns please call us at [phone].

## 2019-12-26 NOTE — Assessment & Plan Note (Addendum)
Patient has been to see Dermatology at [facility] with their plan as follows: Plan:  The above diagnosis and treatment options were reviewed with the patient. 1. We educated the patient today about the benign nature of some of the lesions seen on today's exam 2. Did not repeat the biopsy since patient actively using topical steroids.  3. Stop the dexamethasone and begin clobetasol ointment twice daily tapering to TAC ointment 4. Stop cocoa butter and begin plain Vaseline as moisturizer 5. Stop the [facility] and begin either Cerave or Cetaphil as a cleanser.  6. Side effects discussed and patient recommended to read package insert of all medications. 7. We can see the patient back in 3 months or sooner as needed .

## 2019-12-26 NOTE — Progress Notes (Signed)
   CC: HTN, DMII  HPI:[name] is a 69 y.o. female who presents for evaluation of HTN and DMII. Please see individual problem based A/P for details.  Depression, PHQ-9: Based on the patients    Office Visit from 12/26/2019 in [place]  PHQ-9 Total Score  1     score we have decided to monitor.  Past Medical History:  Diagnosis Date  . ATN (acute tubular necrosis) ([place]) 07/15/2014  . Automatic implantable cardioverter-defibrillator in situ   . Automatic implantable cardioverter-defibrillator in situ 05/04/2007   Qualifier: Diagnosis of  By: Isla Pence    . Cardiac defibrillator in situ    Atlas II VR (SJM) implanted by Dr Lovena Le  . CHF (congestive heart failure) ([place])   . Chronic combined systolic and diastolic heart failure (HCC)    a. EF 35-40% in past;  b. Echo 7/13:  EF 45-50%, Gr 2 diast dysfn, mild AI, mild MAC, trivial MR, mild LAE, PASP 47.  [name] Chronic ulcer of leg ([place])    04-09-15 resolved-not a problem.  . Colon polyps 04/12/2013   Rectosigmoid polyp  . COPD (chronic obstructive pulmonary disease) (HCC)    O2 at night  . Depression   . Diabetes mellitus   . Diabetes mellitus, type 2 ([place]) 04/03/2012   HIGH RISK FEET.. Please have patient take shoes and socks off every visit for visual foot inspection.    . Eczema   . Elevated alkaline phosphatase level    GGT and 5'nucleotidase 8/13 normal  . Health care maintenance 01/22/2013   Surgically absent cervix- no pap needed (Path report 07/2000: uterine body with attached bilateral adnexa and separate cervix.)  . History of oxygen administration    oxygen @ 2 l/m nasally bedtime 24/7  . HTN (hypertension)   . Hx of cardiac cath    a. [place] 2003 normal;  b. LHC 6/13:  Mild calcification in the LM, o/w normal coronary arteries, EF 45%.   . Hyperkalemia 08/08/2017  . Hyperlipidemia   . Hyperlipidemia associated with type 2 diabetes mellitus ([place]) 05/25/2007   Qualifier: Diagnosis of  By:  Hassell Done FNP, Tori Milks    . Hyperthyroidism, subclinical   . Implantable cardioverter-defibrillator (ICD) generator end of life   . Lipoma   . Need for hepatitis C screening test 04/25/2019  . NICM (nonischemic cardiomyopathy) ([place])   . Obesity   . On home oxygen therapy    "2L; 24/7" (10/11/2016)  . Post menopausal syndrome   . Shortness of breath 07/14/2017  . Skin rash 07/12/2018  . Sleep apnea    pt denies 04/12/2013   Review of Systems: ROS negative except as per HPI.  Physical Exam: Vitals:   12/26/19 1013  BP: (!) 122/58  Pulse: 89  Temp: 97.7 F (36.5 C)  TempSrc: Oral  SpO2: 100%  Weight: 197 lb 3.2 oz (89.4 kg)  Height: _0  (1.575 m)   Filed Weights   12/26/19 1013  Weight: 197 lb 3.2 oz (89.4 kg)   General: A/O x4, in no acute distress, afebrile, nondiaphoretic HEENT: PEERL, EMO intact Cardio: RRR, no mrg's  Pulmonary: CTA bilaterally, no wheezing or crackles  Abdomen: Bowel sounds normal, soft, nontender  MSK: BLE nontender, nonedematous Psych: Appropriate affect, not depressed in appearance, engages well  Assessment & Plan:   See Encounters Tab for problem based charting.  Patient discussed with Dr. Philipp Ovens

## 2019-12-26 NOTE — Assessment & Plan Note (Addendum)
Denied dyspnea today. Continues to have some mild orthopnea and exercise intolerance. She has NYHA class II-III symptoms. Last Echo in 2018 with LVEF of 30% and diffuse hypokinesis.   Plan: Continue lasix 80mg  BID, spironolactone 12.5mg  daily, metoprolol 100mg  daily, lisinopril 40mg  and SGLT-2i  Recommend repeat Echo discussion at next visit.

## 2019-12-27 ENCOUNTER — Other Ambulatory Visit: Payer: Self-pay | Admitting: Internal Medicine

## 2019-12-27 DIAGNOSIS — Z1231 Encounter for screening mammogram for malignant neoplasm of breast: Secondary | ICD-10-CM

## 2019-12-27 LAB — BMP8+ANION GAP
Anion Gap: 19 mmol/L — ABNORMAL HIGH (ref 10.0–18.0)
BUN/Creatinine Ratio: 22 (ref 12–28)
BUN: 38 mg/dL — ABNORMAL HIGH (ref 8–27)
CO2: 24 mmol/L (ref 20–29)
Calcium: 9.5 mg/dL (ref 8.7–10.3)
Chloride: 98 mmol/L (ref 96–106)
Creatinine, Ser: 1.76 mg/dL — ABNORMAL HIGH (ref 0.57–1.00)
GFR calc Af Amer: 34 mL/min/{1.73_m2} — ABNORMAL LOW (ref >59)
GFR calc non Af Amer: 30 mL/min/{1.73_m2} — ABNORMAL LOW (ref >59)
Glucose: 148 mg/dL — ABNORMAL HIGH (ref 65–99)
Potassium: 4.8 mmol/L (ref 3.5–5.2)
Sodium: 141 mmol/L (ref 134–144)

## 2019-12-27 NOTE — Progress Notes (Signed)
Internal Medicine Clinic Attending  Case discussed with Dr. Harbrecht at the time of the visit.  We reviewed the resident's history and exam and pertinent patient test results.  I agree with the assessment, diagnosis, and plan of care documented in the resident's note.   

## 2019-12-28 DIAGNOSIS — J449 Chronic obstructive pulmonary disease, unspecified: Secondary | ICD-10-CM | POA: Diagnosis not present

## 2019-12-28 DIAGNOSIS — I509 Heart failure, unspecified: Secondary | ICD-10-CM | POA: Diagnosis not present

## 2019-12-31 ENCOUNTER — Ambulatory Visit: Payer: Medicare Other | Attending: Family

## 2019-12-31 DIAGNOSIS — Z23 Encounter for immunization: Secondary | ICD-10-CM

## 2019-12-31 NOTE — Progress Notes (Signed)
   Covid-19 Vaccination Clinic  Name:  [name]    MRN: [id number] DOB: 12-08-1950  12/31/2019  [name] was observed post Covid-19 immunization for 15 minutes without incident. She was provided with Vaccine Information Sheet and instruction to access the V-Safe system.   [name] was instructed to call 911 with any severe reactions post vaccine: [name] Difficulty breathing  . Swelling of face and throat  . A fast heartbeat  . A bad rash all over body  . Dizziness and weakness   Immunizations Administered    Name Date Dose VIS Date Route   Moderna COVID-19 Vaccine 12/31/2019  1:39 PM 0.5 mL 09/03/2019 Intramuscular   Manufacturer: Moderna   Lot: [id number]   [place]: [id number]

## 2020-01-02 ENCOUNTER — Telehealth: Payer: Self-pay | Admitting: Internal Medicine

## 2020-01-02 DIAGNOSIS — I10 Essential (primary) hypertension: Secondary | ICD-10-CM

## 2020-01-02 DIAGNOSIS — I5042 Chronic combined systolic (congestive) and diastolic (congestive) heart failure: Secondary | ICD-10-CM

## 2020-01-02 MED ORDER — FUROSEMIDE 80 MG PO TABS: 40.0000 mg | ORAL_TABLET | Freq: Two times a day (BID) | ORAL | 1 refills | Status: DC

## 2020-01-02 NOTE — Telephone Encounter (Signed)
I notified the patient of her increased sCr to 1.76 from 1.22 several months prior. On exam on the 25th she did no appear volume overloaded nor did she endorse symptoms of such. We will reduce her lasix dose to 40mg  BID and have her return in one week for a repeat BMP/weight check. She may need her spironolactone adjusted down as well given that her potassium levels typically run above 4.5 mmol/L.

## 2020-01-09 ENCOUNTER — Ambulatory Visit (INDEPENDENT_AMBULATORY_CARE_PROVIDER_SITE_OTHER): Payer: Medicare Other | Admitting: Internal Medicine

## 2020-01-09 ENCOUNTER — Other Ambulatory Visit: Payer: Self-pay

## 2020-01-09 ENCOUNTER — Encounter: Payer: Self-pay | Admitting: Internal Medicine

## 2020-01-09 VITALS — BP 136/72 | HR 86 | Temp 97.9°F | Ht 61.0 in | Wt 198.3 lb

## 2020-01-09 DIAGNOSIS — Z79899 Other long term (current) drug therapy: Secondary | ICD-10-CM | POA: Diagnosis not present

## 2020-01-09 DIAGNOSIS — I5042 Chronic combined systolic (congestive) and diastolic (congestive) heart failure: Secondary | ICD-10-CM

## 2020-01-09 NOTE — Patient Instructions (Addendum)
[name],  It was nice meeting you today! I am glad you are feeling well.  We will check blood work again today to look at your kidney function. I will call you with the results and let you know of any further medication changes at that time.  Please continue to take a decreased dose of your home lasix, 40mg  twice a day.  Follow-up with Dr. Berline Lopes next month.  Thank you for letting us be a part of your care!

## 2020-01-09 NOTE — Progress Notes (Signed)
CC: follow-up on blood pressure and weight check  HPI:  [name] is a 69 y.o. F with significant PMH as outlined below, who presents today for weight check and repeat lab work. Please see problem-based charting for additional information.   Past Medical History:  Diagnosis Date  . ATN (acute tubular necrosis) ([place]) 07/15/2014  . Automatic implantable cardioverter-defibrillator in situ   . Automatic implantable cardioverter-defibrillator in situ 05/04/2007   Qualifier: Diagnosis of  By: Isla Pence    . Cardiac defibrillator in situ    Atlas II VR (SJM) implanted by Dr Lovena Le  . CHF (congestive heart failure) ([place])   . Chronic combined systolic and diastolic heart failure (HCC)    a. EF 35-40% in past;  b. Echo 7/13:  EF 45-50%, Gr 2 diast dysfn, mild AI, mild MAC, trivial MR, mild LAE, PASP 47.  [name] Chronic ulcer of leg ([place])    04-09-15 resolved-not a problem.  . Colon polyps 04/12/2013   Rectosigmoid polyp  . COPD (chronic obstructive pulmonary disease) (HCC)    O2 at night  . Depression   . Diabetes mellitus   . Diabetes mellitus, type 2 ([place]) 04/03/2012   HIGH RISK FEET.. Please have patient take shoes and socks off every visit for visual foot inspection.    . Eczema   . Elevated alkaline phosphatase level    GGT and 5'nucleotidase 8/13 normal  . Health care maintenance 01/22/2013   Surgically absent cervix- no pap needed (Path report 07/2000: uterine body with attached bilateral adnexa and separate cervix.)  . History of oxygen administration    oxygen @ 2 l/m nasally bedtime 24/7  . HTN (hypertension)   . Hx of cardiac cath    a. [place] 2003 normal;  b. LHC 6/13:  Mild calcification in the LM, o/w normal coronary arteries, EF 45%.   . Hyperkalemia 08/08/2017  . Hyperlipidemia   . Hyperlipidemia associated with type 2 diabetes mellitus ([place]) 05/25/2007   Qualifier: Diagnosis of  By: Hassell Done FNP, Tori Milks    . Hyperthyroidism, subclinical   . HYPERTHYROIDISM,  SUBCLINICAL 05/06/2009   Qualifier: Diagnosis of  By: Darrick Meigs    . Implantable cardioverter-defibrillator (ICD) generator end of life   . Lipoma   . Need for hepatitis C screening test 04/25/2019  . NICM (nonischemic cardiomyopathy) ([place])   . Obesity   . On home oxygen therapy    "2L; 24/7" (10/11/2016)  . Post menopausal syndrome   . Shortness of breath 07/14/2017  . Skin rash 07/12/2018  . Sleep apnea    pt denies 04/12/2013   Review of Systems:  Review of Systems  Constitutional: Negative for chills and fever.  Respiratory: Positive for shortness of breath (on exertion, at baseline). Negative for cough and wheezing.   Cardiovascular: Positive for leg swelling (stable). Negative for chest pain, palpitations, orthopnea and PND.  Gastrointestinal: Negative for abdominal pain, nausea and vomiting.  Genitourinary: Negative for dysuria and frequency.   Physical Exam:  Vitals:   01/09/20 0913  BP: 136/72  Pulse: 86  Temp: 97.9 F (36.6 C)  TempSrc: Oral  SpO2: 100%  Weight: 198 lb 4.8 oz (89.9 kg)  Height: _0  (1.549 m)   Physical Exam Vitals and nursing note reviewed.  Constitutional:      General: She is not in acute distress.    Appearance: Normal appearance. She is not ill-appearing.  Cardiovascular:     Rate and Rhythm: Normal rate and regular rhythm.  Heart sounds: Normal heart sounds. No murmur. No friction rub. No gallop.   Pulmonary:     Effort: Pulmonary effort is normal.     Breath sounds: Normal breath sounds. No wheezing, rhonchi or rales.  Musculoskeletal:     Comments: Trace bilateral LE pitting edema to above the ankle  Skin:    General: Skin is warm and dry.  Neurological:     Mental Status: She is alert.    Assessment & Plan:   See Encounters Tab for problem based charting.  Patient discussed with Dr. Daryll Drown

## 2020-01-09 NOTE — Assessment & Plan Note (Signed)
Pt presents today for weight check and repeat BMP. Was seen by her PCP 2 weeks ago, and sCr at that time had increased from 1.22 to 1.76. She was instructed to decrease her lasix dose to 40mg  BID and return for close follow-up. Today, pt's weight is only 1lb up from appointment 2 weeks ago and appears at her baseline. Pt states she is feeling well. Endorses her baseline dyspnea on exertion, but states she is able to vacuum, cook, wash dishes, and clean up around the house without getting short of breath. Denies chest pain, palpitations, orthopnea, PND, or progressive peripheral swelling. States she has been urinating less since the lasix dose was decreased, though is still having good urine output. Pt says she drinks between 4-7 bottles of water per day. BP controlled today at 136/72. On exam, pt appears euvolemic today.  - continue lasix 40mg  BID - repeat BMP today - will call pt with results and notify her then of any additional medication changes, may need to decrease spiro - for now, continue spironolactone 12.5mg  daily, metoprolol 100mg  daily, lisinopril 40mg , and empagliflozin - PCP follow-up next month

## 2020-01-10 ENCOUNTER — Telehealth: Payer: Self-pay | Admitting: Internal Medicine

## 2020-01-10 LAB — BMP8+ANION GAP
Anion Gap: 15 mmol/L (ref 10.0–18.0)
BUN/Creatinine Ratio: 20 (ref 12–28)
BUN: 19 mg/dL (ref 8–27)
CO2: 23 mmol/L (ref 20–29)
Calcium: 9.7 mg/dL (ref 8.7–10.3)
Chloride: 101 mmol/L (ref 96–106)
Creatinine, Ser: 0.96 mg/dL (ref 0.57–1.00)
GFR calc Af Amer: 71 mL/min/{1.73_m2} (ref >59)
GFR calc non Af Amer: 62 mL/min/{1.73_m2} (ref >59)
Glucose: 147 mg/dL — ABNORMAL HIGH (ref 65–99)
Potassium: 4.6 mmol/L (ref 3.5–5.2)
Sodium: 139 mmol/L (ref 134–144)

## 2020-01-10 NOTE — Telephone Encounter (Signed)
New message   Patient states that she wants to change her Device Clinic Appt . Please call the patient.

## 2020-01-16 NOTE — Progress Notes (Signed)
Internal Medicine Clinic Attending  Case discussed with Dr. Jones at the time of the visit.  We reviewed the resident's history and exam and pertinent patient test results.  I agree with the assessment, diagnosis, and plan of care documented in the resident's note.  

## 2020-01-28 DIAGNOSIS — J449 Chronic obstructive pulmonary disease, unspecified: Secondary | ICD-10-CM | POA: Diagnosis not present

## 2020-01-28 DIAGNOSIS — I509 Heart failure, unspecified: Secondary | ICD-10-CM | POA: Diagnosis not present

## 2020-02-03 ENCOUNTER — Encounter: Payer: Self-pay | Admitting: *Deleted

## 2020-02-12 ENCOUNTER — Encounter: Payer: Self-pay | Admitting: Internal Medicine

## 2020-02-12 ENCOUNTER — Ambulatory Visit (INDEPENDENT_AMBULATORY_CARE_PROVIDER_SITE_OTHER): Payer: Medicare Other | Admitting: Internal Medicine

## 2020-02-12 VITALS — BP 163/73 | HR 88 | Temp 97.7°F | Ht 61.0 in | Wt 200.7 lb

## 2020-02-12 DIAGNOSIS — S82841A Displaced bimalleolar fracture of right lower leg, initial encounter for closed fracture: Secondary | ICD-10-CM

## 2020-02-12 DIAGNOSIS — F17200 Nicotine dependence, unspecified, uncomplicated: Secondary | ICD-10-CM

## 2020-02-12 DIAGNOSIS — E1142 Type 2 diabetes mellitus with diabetic polyneuropathy: Secondary | ICD-10-CM

## 2020-02-12 DIAGNOSIS — I10 Essential (primary) hypertension: Secondary | ICD-10-CM

## 2020-02-12 DIAGNOSIS — Z0000 Encounter for general adult medical examination without abnormal findings: Secondary | ICD-10-CM | POA: Diagnosis not present

## 2020-02-12 DIAGNOSIS — J984 Other disorders of lung: Secondary | ICD-10-CM

## 2020-02-12 DIAGNOSIS — M810 Age-related osteoporosis without current pathological fracture: Secondary | ICD-10-CM | POA: Insufficient documentation

## 2020-02-12 DIAGNOSIS — I5042 Chronic combined systolic (congestive) and diastolic (congestive) heart failure: Secondary | ICD-10-CM

## 2020-02-12 DIAGNOSIS — E1143 Type 2 diabetes mellitus with diabetic autonomic (poly)neuropathy: Secondary | ICD-10-CM

## 2020-02-12 DIAGNOSIS — M8000XD Age-related osteoporosis with current pathological fracture, unspecified site, subsequent encounter for fracture with routine healing: Secondary | ICD-10-CM

## 2020-02-12 DIAGNOSIS — Z95810 Presence of automatic (implantable) cardiac defibrillator: Secondary | ICD-10-CM

## 2020-02-12 MED ORDER — CALCIUM CARBONATE-VITAMIN D 500-200 MG-UNIT PO TABS: 1.0000 | ORAL_TABLET | Freq: Two times a day (BID) | ORAL | 6 refills | Status: DC

## 2020-02-12 NOTE — Assessment & Plan Note (Signed)
Recommended consideration of initiation of bisphosphonate therapy based on her report fragility fracture of the ankle. We will obtain a Dexa screen today as well but feel that the benefits outweigh the risks. Her major osteoporotic and hip fracture risk % are 14 and 2.6 respectively below the recommended 20/3% for bisphosphonate therapy. Given this, the patient has agreed to hold off on therapy today until after her Dexa screen is completed.

## 2020-02-12 NOTE — Progress Notes (Signed)
This AWV is being conducted in person.  Subjective:   [name] is a 69 y.o. female who presents for a Medicare Annual Wellness Visit.  The following items have been reviewed and updated today in the appropriate area in the EMR.   Health Risk Assessment  Height, weight, BMI, and BP Visual acuity if needed Depression screen Fall risk / safety level Advance directive discussion Medical and family history were reviewed and updated Updating list of other providers & suppliers Medication reconciliation, including over the counter medicines Cognitive screen Written screening schedule Risk Factor list Personalized health advice, risky behaviors, and treatment advice  Vitals:   02/12/20 1457  BP: (!) 163/73  Pulse: 88  Temp: 97.7 F (36.5 C)  SpO2: 97%   Last Weight  Most recent update: 02/12/2020  2:58 PM   Weight  91 kg (200 lb 11.2 oz)           Filed Weights   02/12/20 1457  Weight: 200 lb 11.2 oz (91 kg)    Social History   Social History Narrative   ** Merged History Encounter **       Married   Patient enjoys going to [employer], church and spending time with family        Objective:    Vitals: BP (!) 163/73 (BP Location: Right Arm, Patient Position: Sitting, Cuff Size: Normal)   Pulse 88   Temp 97.7 F (36.5 C) (Oral)   Ht 5\' 1"  (1.549 m)   Wt 200 lb 11.2 oz (91 kg)   SpO2 97%   BMI 37.92 kg/m  Vitals are [facility] performed  Activities of Daily Living In your present state of health, do you have any difficulty performing the following activities: 02/12/2020 01/09/2020  Hearing? N N  Vision? N N  Difficulty concentrating or making decisions? N N  Walking or climbing stairs? N Y  Dressing or bathing? N N  Doing errands, shopping? N N  Some recent data might be hidden   Goals Goals    . HEMOGLOBIN A1C < 7.0    . LDL CALC < 100    . Quit smoking / using tobacco       Fall Risk Fall Risk  02/12/2020 01/09/2020 12/26/2019 06/19/2019 04/24/2019    Falls in the past year? 1 1 1  0 0  Number falls in past yr: 0 0 0 - -  Comment - - - - -  Injury with Fall? 1 1 1  - -  Comment - - - - -  Risk Factor Category  - - - - -  Risk for fall due to : History of fall(s) History of fall(s) History of fall(s) - -  Risk for fall due to: Comment - Was walking in rain and fell [place] in "stormy weather" - -  Follow up Falls evaluation completed Falls prevention discussed Falls prevention discussed Falls prevention discussed -    Depression Screen PHQ 2/9 Scores 02/12/2020 01/09/2020 12/26/2019 06/19/2019  PHQ - 2 Score 0 - 0 0  PHQ- 9 Score 2 - 1 -  Exception Documentation - Patient refusal - -     Cognitive Testing: 5 on Minicog Mini-Cog - 02/12/20 1522    Normal clock drawing test?  yes    How many words correct?  3       Assessment and Plan:    During the course of the visit the patient was educated and counseled about appropriate screening and preventive services as  documented in the assessment and plan.  The printed AVS was given to the patient and included an updated screening schedule, a list of risk factors, and personalized health advice.      Kathi Ludwig, MD  02/12/2020

## 2020-02-12 NOTE — Patient Instructions (Addendum)
Annual Wellness Visit   Medicare Covered Preventative Screenings and Services  Services & Screenings Men and Women Who How Often Need? Date of Last Service Action  Abdominal Aortic Aneurysm Adults with AAA risk factors Once     Alcohol Misuse and Counseling All Adults Screening once a year if no alcohol misuse. Counseling up to 4 face to face sessions.     Bone Density Measurement  Adults at risk for osteoporosis Once every 2 yrs Yes  Ordered today  Lipid Panel Z13.6 All adults without CV disease Once every 5 yrs     Colorectal Cancer   Stool sample or  Colonoscopy All adults 25 and older   Once every year  Every 10 years     Depression All Adults Once a year  Today   Diabetes Screening Blood glucose, post glucose load, or GTT Z13.1  All adults at risk  Pre-diabetics  Once per year  Twice per year     Diabetes  Self-Management Training All adults Diabetics 10 hrs first year; 2 hours subsequent years. Requires Copay     Glaucoma  Diabetics  Family history of glaucoma  African Americans 67 yrs +  Hispanic Americans 60 yrs + Annually - requires coppay     Hepatitis C Z72.89 or F19.20  High Risk for HCV  Born between 1945 and 1965  Annually  Once     HIV Z11.4 All adults based on risk  Annually btw ages 79 & 51 regardless of risk  Annually > 65 yrs if at increased risk     Lung Cancer Screening Asymptomatic adults aged 54-77 with 30 pack yr history and current smoker OR quit within the last 15 yrs Annually Must have counseling and shared decision making documentation before first screen No  Patient did not desire  Medical Nutrition Therapy Adults with   Diabetes  Renal disease  Kidney transplant within past 3 yrs 3 hours first year; 2 hours subsequent years     Obesity and Counseling All adults Screening once a year Counseling if BMI 30 or higher  Today   Tobacco Use Counseling Adults who use tobacco  Up to 8 visits in one year Y    Vaccines Z23  Hepatitis  B  Influenza   Pneumonia  Adults   Once  Once every flu season  Two different vaccines separated by one year     Next Annual Wellness Visit People with Medicare Every year       Services & Screenings Women Who How Often Need  Date of Last Service Action  Mammogram  Z12.31 Women over 77 One baseline ages 89-39. Annually ager 40 yrs+     Pap tests All women Annually if high risk. Every 2 yrs for normal risk women     Screening for cervical cancer with   Pap (Z01.419 nl or Z01.411abnl) &  HPV Z11.51 Women aged 77 to 86 Once every 5 yrs     Screening pelvic and breast exams All women Annually if high risk. Every 2 yrs for normal risk women     Sexually Transmitted Diseases  Chlamydia  Gonorrhea  Syphilis All at risk adults Annually for non pregnant females at increased risk         [place] Men Who How Ofter Need  Date of Last Service Action  Prostate Cancer - DRE & PSA Men over 50 Annually.  DRE might require a copay.     Sexually Transmitted Diseases  Syphilis All  at risk adults Annually for men at increased risk         Things That May Be Affecting Your Health: No Alcohol No Hearing loss  Pain   No Depression No Home Safety  Sexual Health   Diabetes No Lack of physical activity  Stress  No Difficulty with daily activities No Loneliness  Tiredness  No Drug use No Medicines  Tobacco use  No Falls No Motor Vehicle Safety  Weight   Food choices  Oral Health  Other  No=no intervention needed, Yes= intervention needed, blank is not addressed.  YOUR PERSONALIZED HEALTH PLAN : 1. Schedule your next subsequent Medicare Wellness visit in one year from today on 02/11/2021. 2. Attend all of your regular appointments to address your medical issues 3. Complete the preventative screenings and services 4. Dexa screen ordered, after this returns, we will discuss the risks vs benefits of a bisphosphonate therapy.  5. Please call and schedule an eye doctor exam  with Dr. Valetta Close. 6. Please consider accepting help with smoking cessation.

## 2020-02-14 ENCOUNTER — Ambulatory Visit
Admission: RE | Admit: 2020-02-14 | Discharge: 2020-02-14 | Disposition: A | Discharge status: Home / Self Care | Payer: Medicare Other | Source: Ambulatory Visit | Attending: Internal Medicine | Admitting: Internal Medicine

## 2020-02-14 ENCOUNTER — Other Ambulatory Visit: Payer: Self-pay

## 2020-02-14 DIAGNOSIS — Z1231 Encounter for screening mammogram for malignant neoplasm of breast: Secondary | ICD-10-CM | POA: Diagnosis not present

## 2020-02-18 NOTE — Progress Notes (Signed)
Internal Medicine Clinic Attending  Case discussed with Dr. Berline Lopes at the time of the visit.  We reviewed the AWV findings.  I agree with the assessment, diagnosis, and plan of care documented in the AWV note.

## 2020-02-20 ENCOUNTER — Other Ambulatory Visit: Payer: Self-pay

## 2020-02-20 ENCOUNTER — Other Ambulatory Visit (HOSPITAL_COMMUNITY): Payer: Self-pay

## 2020-02-20 ENCOUNTER — Inpatient Hospital Stay (HOSPITAL_COMMUNITY)
Admission: EM | Admit: 2020-02-20 | Discharge: 2020-02-23 | DRG: 292 | Disposition: A | Discharge status: Home / Self Care | Payer: Medicare Other | Attending: Internal Medicine | Admitting: Internal Medicine

## 2020-02-20 ENCOUNTER — Emergency Department (HOSPITAL_COMMUNITY): Payer: Medicare Other

## 2020-02-20 DIAGNOSIS — R079 Chest pain, unspecified: Secondary | ICD-10-CM | POA: Diagnosis not present

## 2020-02-20 DIAGNOSIS — Z95810 Presence of automatic (implantable) cardiac defibrillator: Secondary | ICD-10-CM | POA: Diagnosis not present

## 2020-02-20 DIAGNOSIS — Z20822 Contact with and (suspected) exposure to covid-19: Secondary | ICD-10-CM | POA: Diagnosis present

## 2020-02-20 DIAGNOSIS — Z6837 Body mass index (BMI) 37.0-37.9, adult: Secondary | ICD-10-CM

## 2020-02-20 DIAGNOSIS — R0902 Hypoxemia: Secondary | ICD-10-CM | POA: Diagnosis not present

## 2020-02-20 DIAGNOSIS — R0600 Dyspnea, unspecified: Secondary | ICD-10-CM | POA: Diagnosis present

## 2020-02-20 DIAGNOSIS — I428 Other cardiomyopathies: Secondary | ICD-10-CM | POA: Diagnosis present

## 2020-02-20 DIAGNOSIS — J9610 Chronic respiratory failure, unspecified whether with hypoxia or hypercapnia: Secondary | ICD-10-CM | POA: Diagnosis present

## 2020-02-20 DIAGNOSIS — Z79899 Other long term (current) drug therapy: Secondary | ICD-10-CM

## 2020-02-20 DIAGNOSIS — Z743 Need for continuous supervision: Secondary | ICD-10-CM | POA: Diagnosis not present

## 2020-02-20 DIAGNOSIS — I5020 Unspecified systolic (congestive) heart failure: Secondary | ICD-10-CM | POA: Diagnosis present

## 2020-02-20 DIAGNOSIS — F17210 Nicotine dependence, cigarettes, uncomplicated: Secondary | ICD-10-CM | POA: Diagnosis present

## 2020-02-20 DIAGNOSIS — I110 Hypertensive heart disease with heart failure: Secondary | ICD-10-CM | POA: Diagnosis not present

## 2020-02-20 DIAGNOSIS — I5032 Chronic diastolic (congestive) heart failure: Secondary | ICD-10-CM | POA: Diagnosis present

## 2020-02-20 DIAGNOSIS — E669 Obesity, unspecified: Secondary | ICD-10-CM | POA: Diagnosis present

## 2020-02-20 DIAGNOSIS — I5042 Chronic combined systolic (congestive) and diastolic (congestive) heart failure: Secondary | ICD-10-CM | POA: Diagnosis present

## 2020-02-20 DIAGNOSIS — E1169 Type 2 diabetes mellitus with other specified complication: Secondary | ICD-10-CM | POA: Diagnosis not present

## 2020-02-20 DIAGNOSIS — I5023 Acute on chronic systolic (congestive) heart failure: Secondary | ICD-10-CM | POA: Diagnosis present

## 2020-02-20 DIAGNOSIS — E1129 Type 2 diabetes mellitus with other diabetic kidney complication: Secondary | ICD-10-CM

## 2020-02-20 DIAGNOSIS — J984 Other disorders of lung: Secondary | ICD-10-CM

## 2020-02-20 DIAGNOSIS — J449 Chronic obstructive pulmonary disease, unspecified: Secondary | ICD-10-CM | POA: Diagnosis not present

## 2020-02-20 DIAGNOSIS — I5043 Acute on chronic combined systolic (congestive) and diastolic (congestive) heart failure: Secondary | ICD-10-CM | POA: Diagnosis present

## 2020-02-20 DIAGNOSIS — Z7982 Long term (current) use of aspirin: Secondary | ICD-10-CM

## 2020-02-20 DIAGNOSIS — Z9981 Dependence on supplemental oxygen: Secondary | ICD-10-CM | POA: Diagnosis not present

## 2020-02-20 DIAGNOSIS — E785 Hyperlipidemia, unspecified: Secondary | ICD-10-CM | POA: Diagnosis present

## 2020-02-20 DIAGNOSIS — E1142 Type 2 diabetes mellitus with diabetic polyneuropathy: Secondary | ICD-10-CM | POA: Diagnosis not present

## 2020-02-20 DIAGNOSIS — R0602 Shortness of breath: Secondary | ICD-10-CM | POA: Diagnosis not present

## 2020-02-20 DIAGNOSIS — I10 Essential (primary) hypertension: Secondary | ICD-10-CM | POA: Diagnosis present

## 2020-02-20 DIAGNOSIS — F17200 Nicotine dependence, unspecified, uncomplicated: Secondary | ICD-10-CM | POA: Diagnosis present

## 2020-02-20 DIAGNOSIS — I5022 Chronic systolic (congestive) heart failure: Secondary | ICD-10-CM | POA: Diagnosis present

## 2020-02-20 DIAGNOSIS — E1165 Type 2 diabetes mellitus with hyperglycemia: Secondary | ICD-10-CM

## 2020-02-20 DIAGNOSIS — I5021 Acute systolic (congestive) heart failure: Secondary | ICD-10-CM | POA: Diagnosis not present

## 2020-02-20 DIAGNOSIS — R0789 Other chest pain: Secondary | ICD-10-CM | POA: Diagnosis not present

## 2020-02-20 DIAGNOSIS — E119 Type 2 diabetes mellitus without complications: Secondary | ICD-10-CM

## 2020-02-20 LAB — BASIC METABOLIC PANEL
Anion gap: 10 (ref 5–15)
BUN: 16 mg/dL (ref 8–23)
CO2: 30 mmol/L (ref 22–32)
Calcium: 9.2 mg/dL (ref 8.9–10.3)
Chloride: 103 mmol/L (ref 98–111)
Creatinine, Ser: 0.85 mg/dL (ref 0.44–1.00)
GFR calc Af Amer: >60 mL/min (ref >60)
GFR calc non Af Amer: >60 mL/min (ref >60)
Glucose, Bld: 115 mg/dL — ABNORMAL HIGH (ref 70–99)
Potassium: 3.4 mmol/L — ABNORMAL LOW (ref 3.5–5.1)
Sodium: 143 mmol/L (ref 135–145)

## 2020-02-20 LAB — CBC
HCT: 38.9 % (ref 36.0–46.0)
Hemoglobin: 11.9 g/dL — ABNORMAL LOW (ref 12.0–15.0)
MCH: 26.3 pg (ref 26.0–34.0)
MCHC: 30.6 g/dL (ref 30.0–36.0)
MCV: 85.9 fL (ref 80.0–100.0)
Platelets: 226 10*3/uL (ref 150–400)
RBC: 4.53 MIL/uL (ref 3.87–5.11)
RDW: 15.4 % (ref 11.5–15.5)
WBC: 8.6 10*3/uL (ref 4.0–10.5)
nRBC: 0 % (ref 0.0–0.2)

## 2020-02-20 LAB — TROPONIN I (HIGH SENSITIVITY)
Troponin I (High Sensitivity): 30 ng/L — ABNORMAL HIGH (ref <18)
Troponin I (High Sensitivity): 31 ng/L — ABNORMAL HIGH (ref <18)

## 2020-02-20 MED ORDER — FUROSEMIDE 10 MG/ML IJ SOLN
40.0000 mg | Freq: Once | INTRAMUSCULAR | Status: DC
  Filled 2020-02-20: qty 4

## 2020-02-20 MED ORDER — ASPIRIN 81 MG PO CHEW
324.0000 mg | CHEWABLE_TABLET | Freq: Once | ORAL | Status: AC
  Administered 2020-02-20: 324 mg via ORAL
  Filled 2020-02-20: qty 4

## 2020-02-20 MED ORDER — UMECLIDINIUM-VILANTEROL 62.5-25 MCG/INH IN AEPB
1.0000 | INHALATION_SPRAY | Freq: Every day | RESPIRATORY_TRACT | Status: DC
  Administered 2020-02-21 – 2020-02-23 (×3): 1 via RESPIRATORY_TRACT
  Filled 2020-02-20: qty 14

## 2020-02-20 MED ORDER — ASPIRIN EC 81 MG PO TBEC
81.0000 mg | DELAYED_RELEASE_TABLET | Freq: Two times a day (BID) | ORAL | Status: DC
  Administered 2020-02-21 – 2020-02-23 (×5): 81 mg via ORAL
  Filled 2020-02-20 (×5): qty 1

## 2020-02-20 MED ORDER — SPIRONOLACTONE 12.5 MG HALF TABLET
12.5000 mg | ORAL_TABLET | Freq: Every day | ORAL | Status: DC
  Administered 2020-02-21 – 2020-02-23 (×3): 12.5 mg via ORAL
  Filled 2020-02-20 (×3): qty 1

## 2020-02-20 MED ORDER — LIRAGLUTIDE 18 MG/3ML ~~LOC~~ SOPN
1.8000 mg | PEN_INJECTOR | Freq: Every day | SUBCUTANEOUS | Status: DC
  Administered 2020-02-22: 1.8 mg via SUBCUTANEOUS
  Filled 2020-02-20 (×4): qty 3

## 2020-02-20 MED ORDER — SODIUM CHLORIDE 0.9% FLUSH: 3.0000 mL | Freq: Once | INTRAVENOUS | Status: DC

## 2020-02-20 MED ORDER — CALCIUM CARBONATE-VITAMIN D 500-200 MG-UNIT PO TABS
1.0000 | ORAL_TABLET | Freq: Two times a day (BID) | ORAL | Status: DC
  Administered 2020-02-21 – 2020-02-23 (×5): 1 via ORAL
  Filled 2020-02-20 (×6): qty 1

## 2020-02-20 MED ORDER — ALBUTEROL SULFATE (2.5 MG/3ML) 0.083% IN NEBU: 2.5000 mg | INHALATION_SOLUTION | Freq: Four times a day (QID) | RESPIRATORY_TRACT | Status: DC | PRN

## 2020-02-20 MED ORDER — ATORVASTATIN CALCIUM 80 MG PO TABS
80.0000 mg | ORAL_TABLET | Freq: Every day | ORAL | Status: DC
  Administered 2020-02-21 – 2020-02-22 (×2): 80 mg via ORAL
  Filled 2020-02-20 (×3): qty 1

## 2020-02-20 MED ORDER — FUROSEMIDE 10 MG/ML IJ SOLN
40.0000 mg | Freq: Two times a day (BID) | INTRAMUSCULAR | Status: DC
  Administered 2020-02-21 (×3): 40 mg via INTRAVENOUS
  Filled 2020-02-20 (×2): qty 4

## 2020-02-20 MED ORDER — METOPROLOL SUCCINATE ER 100 MG PO TB24
100.0000 mg | ORAL_TABLET | Freq: Every day | ORAL | Status: DC
  Administered 2020-02-21 – 2020-02-23 (×3): 100 mg via ORAL
  Filled 2020-02-20 (×4): qty 1

## 2020-02-20 MED ORDER — ENOXAPARIN SODIUM 40 MG/0.4ML ~~LOC~~ SOLN: 40.0000 mg | SUBCUTANEOUS | Status: DC

## 2020-02-20 MED ORDER — NITROGLYCERIN 0.4 MG SL SUBL: 0.4000 mg | SUBLINGUAL_TABLET | SUBLINGUAL | Status: DC | PRN

## 2020-02-20 NOTE — ED Notes (Signed)
IV nurse consult ordered , unable to access peripheral IV due to poorly visible /minute veins .

## 2020-02-20 NOTE — ED Provider Notes (Signed)
TIME SEEN: 11:15 PM  CHIEF COMPLAINT: Chest pain, shortness of breath  HPI: Patient is a 69 year old female with history of combined systolic and diastolic heart failure, hypertension, diabetes, hyperlipidemia, obesity, COPD who presents to the emergency department with intermittent chest tightness for the past several days and shortness of breath worse with exertion.  Has bilateral lower extremity swelling but no calf tenderness.  Has had dry cough.  No fever.  No nausea, vomiting, diaphoresis or dizziness.  States that she has been on Lasix 40 mg daily but it was cut to 20 mg daily about a month ago.  Per internal medicine physician, patient was on Lasix 80 mg twice daily and was told to cut back to 40 mg twice daily.  PCP - Internal Medicine Teaching Service  Echo 07/15/17:  Study Conclusions   - Left ventricle: The cavity size was mildly dilated. There was  mild concentric hypertrophy. Systolic function was severely  reduced. The estimated ejection fraction was in the range of 25%  to 30%. Moderate diffuse hypokinesis. There was an increased  relative contribution of atrial contraction to ventricular  filling. Doppler parameters are consistent with abnormal left  ventricular relaxation (grade 1 diastolic dysfunction). Doppler  parameters are consistent with high ventricular filling pressure.  - Aortic valve: There was mild regurgitation.  - Mitral valve: Calcified annulus. There was mild regurgitation.  - Pulmonary arteries: Systolic pressure could not be accurately  estimated.   ROS: See HPI Constitutional: no fever  Eyes: no drainage  ENT: no runny nose   Cardiovascular:   chest pain  Resp: SOB  GI: no vomiting GU: no dysuria Integumentary: no rash  Allergy: no hives  Musculoskeletal: no leg swelling  Neurological: no slurred speech ROS otherwise negative  PAST MEDICAL HISTORY/PAST SURGICAL HISTORY:  Past Medical History:  Diagnosis Date  . ATN (acute  tubular necrosis) ([place]) 07/15/2014  . Automatic implantable cardioverter-defibrillator in situ   . Automatic implantable cardioverter-defibrillator in situ 05/04/2007   Qualifier: Diagnosis of  By: Isla Pence    . Cardiac defibrillator in situ    Atlas II VR (SJM) implanted by Dr Lovena Le  . CHF (congestive heart failure) ([place])   . Chronic combined systolic and diastolic heart failure (HCC)    a. EF 35-40% in past;  b. Echo 7/13:  EF 45-50%, Gr 2 diast dysfn, mild AI, mild MAC, trivial MR, mild LAE, PASP 47.  [name] Chronic ulcer of leg ([place])    04-09-15 resolved-not a problem.  . Colon polyps 04/12/2013   Rectosigmoid polyp  . COPD (chronic obstructive pulmonary disease) (HCC)    O2 at night  . Depression   . Diabetes mellitus   . Diabetes mellitus, type 2 ([place]) 04/03/2012   HIGH RISK FEET.. Please have patient take shoes and socks off every visit for visual foot inspection.    . Eczema   . Elevated alkaline phosphatase level    GGT and 5'nucleotidase 8/13 normal  . Health care maintenance 01/22/2013   Surgically absent cervix- no pap needed (Path report 07/2000: uterine body with attached bilateral adnexa and separate cervix.)  . History of oxygen administration    oxygen @ 2 l/m nasally bedtime 24/7  . HTN (hypertension)   . Hx of cardiac cath    a. [place] 2003 normal;  b. LHC 6/13:  Mild calcification in the LM, o/w normal coronary arteries, EF 45%.   . Hyperkalemia 08/08/2017  . Hyperlipidemia   . Hyperlipidemia associated with type 2 diabetes  mellitus ([place]) 05/25/2007   Qualifier: Diagnosis of  By: Hassell Done FNP, Tori Milks    . Hyperthyroidism, subclinical   . HYPERTHYROIDISM, SUBCLINICAL 05/06/2009   Qualifier: Diagnosis of  By: Darrick Meigs    . Implantable cardioverter-defibrillator (ICD) generator end of life   . Lipoma   . Need for hepatitis C screening test 04/25/2019  . NICM (nonischemic cardiomyopathy) ([place])   . Obesity   . On home oxygen therapy    "2L; 24/7" (10/11/2016)  .  Post menopausal syndrome   . Shortness of breath 07/14/2017  . Skin rash 07/12/2018  . Sleep apnea    pt denies 04/12/2013    MEDICATIONS:  Prior to Admission medications   Medication Sig Start Date End Date Taking? Authorizing Provider  albuterol (PROVENTIL) (2.5 MG/3ML) 0.083% nebulizer solution Take 3 mLs (2.5 mg total) by nebulization every 6 (six) hours as needed for wheezing or shortness of breath. 02/20/19   Kathi Ludwig, MD  aspirin EC 81 MG tablet Take 1 tablet (81 mg total) by mouth 2 (two) times daily. 08/12/19   Leandrew Koyanagi, MD  atorvastatin (LIPITOR) 80 MG tablet Take 1 tablet (80 mg total) by mouth at bedtime. 12/26/19   Kathi Ludwig, MD  Blood Glucose Monitoring Suppl (CONTOUR NEXT MONITOR) w/Device KIT 1 Act by Does not apply route 4 (four) times daily. 07/11/18   Kathi Ludwig, MD  calcium-vitamin D (OSCAL WITH D) 500-200 MG-UNIT tablet Take 1 tablet by mouth 2 (two) times daily. 02/12/20   Kathi Ludwig, MD  clobetasol ointment (TEMOVATE) 0.05 %  12/17/19   [provider]  Empagliflozin-metFORMIN HCl (SYNJARDY) 12.5-500 MG TABS Take 1 tablet by mouth 2 (two) times daily. 12/26/19   Kathi Ludwig, MD  furosemide (LASIX) 80 MG tablet Take 0.5 tablets (40 mg total) by mouth 2 (two) times daily. 01/02/20   Kathi Ludwig, MD  glucose blood (CONTOUR NEXT TEST) test strip Use as instructed 07/11/18   Kathi Ludwig, MD  Lancets Misc. (ACCU-CHEK FASTCLIX LANCET) KIT Lancing device, use with lancets for blood glucose monitoring. 07/17/18   Kathi Ludwig, MD  Lancets MISC 1 Act by Does not apply route 4 (four) times daily. 07/11/18   Kathi Ludwig, MD  liraglutide (VICTOZA) 18 MG/3ML SOPN Inject 1.71m daily into the skin 12/26/19   HKathi Ludwig MD  lisinopril (ZESTRIL) 40 MG tablet TAKE 1 TABLET(40 MG) BY MOUTH DAILY 05/28/19   HKathi Ludwig MD  metoprolol succinate (TOPROL-XL) 50 MG 24 hr tablet Take 2 tablets (100 mg  total) by mouth daily. 12/26/19   HKathi Ludwig MD  nitroGLYCERIN (NITROSTAT) 0.4 MG SL tablet Place 1 tablet (0.4 mg total) under the tongue every 5 (five) minutes as needed for chest pain. Reported on 03/18/2016 06/27/18   KForde Dandy PharmD  ondansetron (ZOFRAN) 4 MG tablet Take 1-2 tablets (4-8 mg total) by mouth every 8 (eight) hours as needed for nausea or vomiting. 08/12/19   XLeandrew Koyanagi MD  spironolactone (ALDACTONE) 25 MG tablet TAKE 0.5 TABLETS BY MOUTH EVERY DAY 12/26/19   HKathi Ludwig MD  umeclidinium-vilanterol (ANORO ELLIPTA) 62.5-25 MCG/INH AEPB Inhale 1 puff into the lungs daily. 10/28/19   HKathi Ludwig MD    ALLERGIES:  Allergies  Allergen Reactions  . Actos [Pioglitazone] Other (See Comments)    REACTION: congestive heart failure   . Naproxen Other (See Comments)    5088mdose made her sleep for two days  . Rosiglitazone Hives  . Hydrocodone-Acetaminophen Itching  . Hydrocortisone Hives,  Itching and Other (See Comments)    unknown    SOCIAL HISTORY:  Social History   Tobacco Use  . Smoking status: Current Every Day Smoker    Packs/day: 0.15    Years: 40.00    Pack years: 6.00    Types: Cigarettes  . Smokeless tobacco: Never Used  . Tobacco comment: 4 cig per day  Substance Use Topics  . Alcohol use: No    Alcohol/week: 1.0 standard drinks    Types: 1 Glasses of wine per week    FAMILY HISTORY: Family History  Problem Relation Age of Onset  . Stroke Mother   . Seizures Father   . Heart disease Father   . Diabetes Sister   . Asthma Maternal Aunt        aunts  . Asthma Maternal Uncle        uncles  . Heart disease Paternal Aunt        aunts  . Heart disease Paternal Uncle        uncles  . Heart disease Maternal Aunt        aunts  . Heart disease Maternal Uncle        uncles  . Heart disease Maternal Grandfather   . Breast cancer Daughter   . Breast cancer Cousin   . Colon cancer Neg Hx   . Colon polyps Neg Hx   .  Esophageal cancer Neg Hx   . Kidney disease Neg Hx   . Gallbladder disease Neg Hx     EXAM: BP (!) 163/98 (BP Location: Left Arm) Comment: PATIENT HAS NOT TAKEN HER ANTIHYPERTENSIVE MEDICATIONS TODAY .   Pulse 90   Temp 98.3 F (36.8 C) (Oral)   Resp 18   Ht _0  (1.549 m)   Wt 90.7 kg   SpO2 94%   BMI 37.79 kg/m  CONSTITUTIONAL: Alert and oriented and responds appropriately to questions. Well-appearing; well-nourished HEAD: Normocephalic EYES: Conjunctivae clear, pupils appear equal, EOM appear intact ENT: normal nose; moist mucous membranes NECK: Supple, normal ROM CARD: RRR; S1 and S2 appreciated; no murmurs, no clicks, no rubs, no gallops RESP: Normal chest excursion without splinting or tachypnea; no wheezes, no rhonchi, bilateral rales on exam, no hypoxia or respiratory distress, speaking full sentences, intermittently appears dyspneic at rest ABD/GI: Normal bowel sounds; non-distended; soft, non-tender, no rebound, no guarding, no peritoneal signs, no hepatosplenomegaly BACK:  The back appears normal EXT: Normal ROM in all joints; no deformity noted, symmetric edema in bilateral lower extremities; no cyanosis, no calf tenderness  SKIN: Normal color for age and race; warm; no rash on exposed skin NEURO: Moves all extremities equally PSYCH: The patient's mood and manner are appropriate.   MEDICAL DECISION MAKING: Patient here with chest pain, shortness of breath.  Her high-sensitivity troponin is mildly elevated at 30 and 31 but are flat.  Not having current chest pain at this time.  Does report chest pain and dyspnea with exertion.  Intermittently appears dyspneic at rest but no hypoxia.  Appears volume overloaded on exam.  Last echocardiogram in 2018 showed EF of 25 to 30%.  Labs reviewed/interpreted and show no other significant abnormality.  Chest x-ray reviewed/interpreted and shows diffuse pulmonary edema.  EKG reviewed/interpreted and shows no new abnormality compared to  previous.  Will give aspirin, IV Lasix and admit to internal medicine.  ED PROGRESS: 11:37 PM Discussed patient's case with IM resident, Dr. Berline Lopes.  I have recommended admission and patient (and family if present)  agree with this plan. Admitting physician will place admission orders.   I reviewed all nursing notes, vitals, pertinent previous records and reviewed/interpreted all EKGs, lab and urine results, imaging (as available).      EKG Interpretation  Date/Time:  Thursday Feb 20 2020 20:42:04 EDT Ventricular Rate:  86 PR Interval:  184 QRS Duration: 88 QT Interval:  426 QTC Calculation: 509 R Axis:   -43 Text Interpretation: Sinus rhythm with Premature supraventricular complexes Possible Left atrial enlargement Left axis deviation Nonspecific T wave abnormality Abnormal ECG No significant change since last tracing Confirmed by Pryor Curia [phone]) on 02/20/2020 11:21:00 PM           [name] was evaluated in Emergency Department on 02/20/2020 for the symptoms described in the history of present illness. She was evaluated in the context of the global COVID-19 pandemic, which necessitated consideration that the patient might be at risk for infection with the SARS-CoV-2 virus that causes COVID-19. Institutional protocols and algorithms that pertain to the evaluation of patients at risk for COVID-19 are in a state of rapid change based on information released by regulatory bodies including the CDC and federal and state organizations. These policies and algorithms were followed during the patient's care in the ED.      [name], Delice Bison, DO 02/20/20 [id number]

## 2020-02-20 NOTE — ED Triage Notes (Signed)
Pt here for eval of intermittent cp with shob since last night. 324 ASA given. Wears supplemental O2 as needed, mostly at night. 91% on room air with improvement on 2L. Denies pain on arrival.

## 2020-02-21 ENCOUNTER — Encounter (HOSPITAL_COMMUNITY): Payer: Self-pay | Admitting: Internal Medicine

## 2020-02-21 ENCOUNTER — Inpatient Hospital Stay (HOSPITAL_COMMUNITY): Payer: Medicare Other

## 2020-02-21 DIAGNOSIS — R0600 Dyspnea, unspecified: Secondary | ICD-10-CM

## 2020-02-21 DIAGNOSIS — I5042 Chronic combined systolic (congestive) and diastolic (congestive) heart failure: Secondary | ICD-10-CM

## 2020-02-21 DIAGNOSIS — E1142 Type 2 diabetes mellitus with diabetic polyneuropathy: Secondary | ICD-10-CM

## 2020-02-21 DIAGNOSIS — I5021 Acute systolic (congestive) heart failure: Secondary | ICD-10-CM

## 2020-02-21 LAB — CBC WITH DIFFERENTIAL/PLATELET
Abs Immature Granulocytes: 0.02 10*3/uL (ref 0.00–0.07)
Basophils Absolute: 0 10*3/uL (ref 0.0–0.1)
Basophils Relative: 1 %
Eosinophils Absolute: 0.3 10*3/uL (ref 0.0–0.5)
Eosinophils Relative: 3 %
HCT: 38.4 % (ref 36.0–46.0)
Hemoglobin: 11.8 g/dL — ABNORMAL LOW (ref 12.0–15.0)
Immature Granulocytes: 0 %
Lymphocytes Relative: 23 %
Lymphs Abs: 2 10*3/uL (ref 0.7–4.0)
MCH: 26.5 pg (ref 26.0–34.0)
MCHC: 30.7 g/dL (ref 30.0–36.0)
MCV: 86.3 fL (ref 80.0–100.0)
Monocytes Absolute: 0.6 10*3/uL (ref 0.1–1.0)
Monocytes Relative: 7 %
Neutro Abs: 5.9 10*3/uL (ref 1.7–7.7)
Neutrophils Relative %: 66 %
Platelets: 198 10*3/uL (ref 150–400)
RBC: 4.45 MIL/uL (ref 3.87–5.11)
RDW: 15.6 % — ABNORMAL HIGH (ref 11.5–15.5)
WBC: 8.8 10*3/uL (ref 4.0–10.5)
nRBC: 0 % (ref 0.0–0.2)

## 2020-02-21 LAB — BASIC METABOLIC PANEL
Anion gap: 10 (ref 5–15)
BUN: 19 mg/dL (ref 8–23)
CO2: 28 mmol/L (ref 22–32)
Calcium: 8.7 mg/dL — ABNORMAL LOW (ref 8.9–10.3)
Chloride: 103 mmol/L (ref 98–111)
Creatinine, Ser: 0.88 mg/dL (ref 0.44–1.00)
GFR calc Af Amer: >60 mL/min (ref >60)
GFR calc non Af Amer: >60 mL/min (ref >60)
Glucose, Bld: 190 mg/dL — ABNORMAL HIGH (ref 70–99)
Potassium: 3.6 mmol/L (ref 3.5–5.1)
Sodium: 141 mmol/L (ref 135–145)

## 2020-02-21 LAB — ECHOCARDIOGRAM COMPLETE
Height: 61 in
Weight: 3200 oz

## 2020-02-21 LAB — BRAIN NATRIURETIC PEPTIDE: B Natriuretic Peptide: 155.4 pg/mL — ABNORMAL HIGH (ref 0.0–100.0)

## 2020-02-21 LAB — GLUCOSE, CAPILLARY
Glucose-Capillary: 134 mg/dL — ABNORMAL HIGH (ref 70–99)
Glucose-Capillary: 164 mg/dL — ABNORMAL HIGH (ref 70–99)
Glucose-Capillary: 185 mg/dL — ABNORMAL HIGH (ref 70–99)
Glucose-Capillary: 168 mg/dL — ABNORMAL HIGH (ref 70–99)
Glucose-Capillary: 211 mg/dL — ABNORMAL HIGH (ref 70–99)

## 2020-02-21 LAB — SARS CORONAVIRUS 2 BY RT PCR (HOSPITAL ORDER, PERFORMED IN ~~LOC~~ HOSPITAL LAB): SARS Coronavirus 2: NEGATIVE

## 2020-02-21 LAB — HIV ANTIBODY (ROUTINE TESTING W REFLEX): HIV Screen 4th Generation wRfx: NONREACTIVE

## 2020-02-21 MED ORDER — NICOTINE 7 MG/24HR TD PT24
7.0000 mg | MEDICATED_PATCH | Freq: Every day | TRANSDERMAL | Status: DC
  Administered 2020-02-21 – 2020-02-23 (×3): 7 mg via TRANSDERMAL
  Filled 2020-02-21 (×3): qty 1

## 2020-02-21 MED ORDER — INSULIN ASPART 100 UNIT/ML ~~LOC~~ SOLN
0.0000 [IU] | Freq: Three times a day (TID) | SUBCUTANEOUS | Status: DC
  Administered 2020-02-21 (×3): 2 [IU] via SUBCUTANEOUS
  Administered 2020-02-22: 1 [IU] via SUBCUTANEOUS
  Administered 2020-02-22 (×2): 2 [IU] via SUBCUTANEOUS
  Administered 2020-02-23: 1 [IU] via SUBCUTANEOUS

## 2020-02-21 MED ORDER — FUROSEMIDE 10 MG/ML IJ SOLN
20.0000 mg | Freq: Once | INTRAMUSCULAR | Status: AC
  Administered 2020-02-21: 20 mg via INTRAVENOUS
  Filled 2020-02-21: qty 2

## 2020-02-21 MED ORDER — POTASSIUM CHLORIDE CRYS ER 20 MEQ PO TBCR
40.0000 meq | EXTENDED_RELEASE_TABLET | Freq: Once | ORAL | Status: AC
  Administered 2020-02-21: 40 meq via ORAL
  Filled 2020-02-21: qty 2

## 2020-02-21 MED ORDER — FUROSEMIDE 10 MG/ML IJ SOLN
60.0000 mg | Freq: Two times a day (BID) | INTRAMUSCULAR | Status: DC
  Administered 2020-02-22 – 2020-02-23 (×3): 60 mg via INTRAVENOUS
  Filled 2020-02-21 (×3): qty 6

## 2020-02-21 MED ORDER — ENOXAPARIN SODIUM 40 MG/0.4ML ~~LOC~~ SOLN
40.0000 mg | Freq: Every day | SUBCUTANEOUS | Status: DC
  Administered 2020-02-21 – 2020-02-23 (×3): 40 mg via SUBCUTANEOUS
  Filled 2020-02-21 (×3): qty 0.4

## 2020-02-21 NOTE — ED Notes (Signed)
IV team nurse at bedside. 

## 2020-02-21 NOTE — H&P (Signed)
   Date: 02/21/2020               Patient Name:  [name] MRN: [id number]  DOB: 07/16/1951 Age / Sex: 69 y.o., female   PCP: Harbrecht, Lawrence, MD         Medical Service: Internal Medicine Teaching Service         Attending Physician: Dr. Guilloud, Carolyn, MD    First Contact: Dr. Winters Pager: [phone]  Second Contact: Dr. Bloomfield Pager: [phone]       After Hours (After 5p/  First Contact Pager: [phone]  weekends / holidays): Second Contact Pager: [phone]   Chief Complaint: chest pain, shortness of breath   History of Present Illness: [name] is a 69-year-old female with hypertension, hyperlipidemia, uncontrolled type 2 diabetes, obesity, COPD with current tobacco use disorder, and combined systolic and diastolic heart failure with ICD who presents with chest pain and shortness of breath.  Patient has had intermediate dull chest pain and shortness of breath with exertion for the past several days.  Shortness of breath chest pain is improved with resting for 2 to 3 minutes.  Patient recently had her Lasix dose reduced from 80 mg twice daily to 40 mg twice daily.  Reports compliance with medications.  Patient does not weigh herself daily, but reports recent weight loss with starting Victoza.  She has noticed increased lower extremity edema in the past week and decreased urine output.  She denies any recent illness, dysuria, radiation of chest pain, or chest pain at rest.   Last echo was in 2018, LVEF 25 to 30%.  Troponin flat at 30, 31 in ED.  BNP 155.   Meds:  Current Meds  Medication Sig  . aspirin EC 81 MG tablet Take 1 tablet (81 mg total) by mouth 2 (two) times daily.  . atorvastatin (LIPITOR) 80 MG tablet Take 1 tablet (80 mg total) by mouth at bedtime.  . calcium-vitamin D (OSCAL WITH D) 500-200 MG-UNIT tablet Take 1 tablet by mouth 2 (two) times daily.  . clobetasol ointment (TEMOVATE) 0.05 % Apply 1 application topically 2 (two) times daily as needed (Skin  rash).   . furosemide (LASIX) 80 MG tablet Take 0.5 tablets (40 mg total) by mouth 2 (two) times daily.  . liraglutide (VICTOZA) 18 MG/3ML SOPN Inject 1.8mg daily into the skin (Patient taking differently: Inject 1.8 mg into the skin daily. Inject 1.8mg daily into the skin)  . lisinopril (ZESTRIL) 40 MG tablet TAKE 1 TABLET(40 MG) BY MOUTH DAILY (Patient taking differently: Take 40 mg by mouth daily. )  . metoprolol succinate (TOPROL-XL) 50 MG 24 hr tablet Take 2 tablets (100 mg total) by mouth daily.  . spironolactone (ALDACTONE) 25 MG tablet TAKE 0.5 TABLETS BY MOUTH EVERY DAY (Patient taking differently: Take 12.5 mg by mouth daily. )  . umeclidinium-vilanterol (ANORO ELLIPTA) 62.5-25 MCG/INH AEPB Inhale 1 puff into the lungs daily.     Allergies: Allergies as of 02/20/2020 - Review Complete 02/20/2020  Allergen Reaction Noted  . Actos [pioglitazone] Other (See Comments) 03/12/2009  . Naproxen Other (See Comments) 02/05/2016  . Rosiglitazone Hives 02/11/2016  . Hydrocodone-acetaminophen Itching 08/08/2019  . Hydrocortisone Hives, Itching, and Other (See Comments) 10/06/2009   Past Medical History:  Diagnosis Date  . ATN (acute tubular necrosis) (HCC) 07/15/2014  . Automatic implantable cardioverter-defibrillator in situ   . Automatic implantable cardioverter-defibrillator in situ 05/04/2007   Qualifier: Diagnosis of  By: Craddock, Brenda    .   Cardiac defibrillator in situ    Atlas II VR (SJM) implanted by Dr Lovena Le  . CHF (congestive heart failure) ([place])   . Chronic combined systolic and diastolic heart failure (HCC)    a. EF 35-40% in past;  b. Echo 7/13:  EF 45-50%, Gr 2 diast dysfn, mild AI, mild MAC, trivial MR, mild LAE, PASP 47.  [name] Chronic ulcer of leg ([place])    04-09-15 resolved-not a problem.  . Colon polyps 04/12/2013   Rectosigmoid polyp  . COPD (chronic obstructive pulmonary disease) (HCC)    O2 at night  . Depression   . Diabetes mellitus   . Diabetes mellitus, type 2  ([place]) 04/03/2012   HIGH RISK FEET.. Please have patient take shoes and socks off every visit for visual foot inspection.    . Eczema   . Elevated alkaline phosphatase level    GGT and 5'nucleotidase 8/13 normal  . Health care maintenance 01/22/2013   Surgically absent cervix- no pap needed (Path report 07/2000: uterine body with attached bilateral adnexa and separate cervix.)  . History of oxygen administration    oxygen @ 2 l/m nasally bedtime 24/7  . HTN (hypertension)   . Hx of cardiac cath    a. [place] 2003 normal;  b. LHC 6/13:  Mild calcification in the LM, o/w normal coronary arteries, EF 45%.   . Hyperkalemia 08/08/2017  . Hyperlipidemia   . Hyperlipidemia associated with type 2 diabetes mellitus ([place]) 05/25/2007   Qualifier: Diagnosis of  By: Hassell Done FNP, Tori Milks    . Hyperthyroidism, subclinical   . HYPERTHYROIDISM, SUBCLINICAL 05/06/2009   Qualifier: Diagnosis of  By: Darrick Meigs    . Implantable cardioverter-defibrillator (ICD) generator end of life   . Lipoma   . Need for hepatitis C screening test 04/25/2019  . NICM (nonischemic cardiomyopathy) ([place])   . Obesity   . On home oxygen therapy    "2L; 24/7" (10/11/2016)  . Post menopausal syndrome   . Shortness of breath 07/14/2017  . Skin rash 07/12/2018  . Sleep apnea    pt denies 04/12/2013    Family History:  Family History  Problem Relation Age of Onset  . Stroke Mother   . Seizures Father   . Heart disease Father   . Diabetes Sister   . Asthma Maternal Aunt        aunts  . Asthma Maternal Uncle        uncles  . Heart disease Paternal Aunt        aunts  . Heart disease Paternal Uncle        uncles  . Heart disease Maternal Aunt        aunts  . Heart disease Maternal Uncle        uncles  . Heart disease Maternal Grandfather   . Breast cancer Daughter   . Breast cancer Cousin   . Colon cancer Neg Hx   . Colon polyps Neg Hx   . Esophageal cancer Neg Hx   . Kidney disease Neg Hx   . Gallbladder disease Neg  Hx      Social History:  Social History   Tobacco Use  . Smoking status: Current Every Day Smoker    Packs/day: 0.15    Years: 40.00    Pack years: 6.00    Types: Cigarettes  . Smokeless tobacco: Never Used  . Tobacco comment: 4 cig per day  Substance Use Topics  . Alcohol use: No    Alcohol/week: 1.0  standard drinks    Types: 1 Glasses of wine per week  . Drug use: No    Review of Systems: A complete ROS was negative except as per HPI.  Physical Exam: Blood pressure (!) 163/98, pulse 90, temperature (!) 97.5 F (36.4 C), temperature source Oral, resp. rate 18, height 5' 1" (1.549 m), weight 90.7 kg, SpO2 94 %.  General: NAD, nl appearance HE: Normocephalic, atraumatic , EOMI, Conjunctivae normal ENT: No congestion, no rhinorrhea, no exudate or erythema  Cardiovascular: Normal rate, regular rhythm.  No murmurs, rubs, or gallops Pulmonary : Effort normal, decreased breath sounds bilateral bases, No wheezes, + rales, no rhonchi Abdominal: soft, nontender,  bowel sounds present Musculoskeletal: non pitting edema in feet and ankles , no tenderness in extremities, Skin: Warm, dry , no bruising, erythema, or rash Psychiatric/Behavioral:  normal mood, normal behavior   EKG: personally reviewed my interpretation is sinus rhythm, left axis deviation, unchanged compared to previous tracing.   CXR: personally reviewed my interpretation is bilateral opacities at the bases,increased interstitial marking.   Results for orders placed or performed during the hospital encounter of 02/20/20 (from the past 24 hour(s))  Basic metabolic panel     Status: Abnormal   Collection Time: 02/20/20  6:22 PM  Result Value Ref Range   Sodium 143 135 - 145 mmol/L   Potassium 3.4 (L) 3.5 - 5.1 mmol/L   Chloride 103 98 - 111 mmol/L   CO2 30 22 - 32 mmol/L   Glucose, Bld 115 (H) 70 - 99 mg/dL   BUN 16 8 - 23 mg/dL   Creatinine, Ser 0.85 0.44 - 1.00 mg/dL   Calcium 9.2 8.9 - 10.3 mg/dL   GFR calc  non Af Amer >60 >60 mL/min   GFR calc Af Amer >60 >60 mL/min   Anion gap 10 5 - 15  CBC     Status: Abnormal   Collection Time: 02/20/20  6:22 PM  Result Value Ref Range   WBC 8.6 4.0 - 10.5 K/uL   RBC 4.53 3.87 - 5.11 MIL/uL   Hemoglobin 11.9 (L) 12.0 - 15.0 g/dL   HCT 38.9 36.0 - 46.0 %   MCV 85.9 80.0 - 100.0 fL   MCH 26.3 26.0 - 34.0 pg   MCHC 30.6 30.0 - 36.0 g/dL   RDW 15.4 11.5 - 15.5 %   Platelets 226 150 - 400 K/uL   nRBC 0.0 0.0 - 0.2 %  Troponin I (High Sensitivity)     Status: Abnormal   Collection Time: 02/20/20  6:22 PM  Result Value Ref Range   Troponin I (High Sensitivity) 30 (H) <18 ng/L  Brain natriuretic peptide     Status: Abnormal   Collection Time: 02/20/20  6:22 PM  Result Value Ref Range   B Natriuretic Peptide 155.4 (H) 0.0 - 100.0 pg/mL  Troponin I (High Sensitivity)     Status: Abnormal   Collection Time: 02/20/20  9:25 PM  Result Value Ref Range   Troponin I (High Sensitivity) 31 (H) <18 ng/L  HIV Antibody (routine testing w rflx)     Status: None   Collection Time: 02/21/20 12:13 AM  Result Value Ref Range   HIV Screen 4th Generation wRfx Non Reactive Non Reactive  Glucose, capillary     Status: Abnormal   Collection Time: 02/21/20  2:24 AM  Result Value Ref Range   Glucose-Capillary 134 (H) 70 - 99 mg/dL   10/26/2018 PFTs: FEV1 1.11 (65%) , FVC 1.4 (63%), ,  F/F 79 (100%), DLCO 11.16 (55%), TLC 3.43 (74%) Noted - Used Spriva Respimat 2 hours prior to testing and smoked the morning before her test  Assessment & Plan by Problem: Principal Problem:   Dyspnea Active Problems:   TOBACCO ABUSE   Essential hypertension   DMII (diabetes mellitus, type 2) (HCC)   Restrictive lung disease   Chronic combined systolic and diastolic congestive heart failure (HCC)  Regnia M Belton  68-year-old female with hypertension, hyperlipidemia, uncontrolled type 2 diabetes, obesity, COPD with current tobacco use disorder, and combined systolic and diastolic heart  failure with ICD who presents with chest pain and shortness of breath.  Dyspnea I think this is likely an acute on chronic combined systolic and diastolic heart failure. Troponin flat and EKG unchanged from previous. No symptoms of infection. Covid negative. Patient presenting symptoms of dull chest pain and shortness of breath after walking approximately 50 yards, came on with recent change in diuretic dose. Lasix dose decreased in setting of worsening kidney function, which has improved. Patient is currently 200 pounds and this does not represent a recent change in weight.  She does have pulmonary edema on chest x-ray.Patient has a history of being admitted last year for dyspnea and appeared symptoms were from both HF and lung disease. BNP 155 , compared to 124 when admitted last year. Last echo in 2018 shows EF 25-30% and likely has a small reserve. Although her findings are not completely convincing of a heart failure exacerbation, it is possible patients chronic heart failure is easily exacerbated in setting of recent decrease  Plan: -Echo -IV Lasix 40 mg twice daily -Spironolactone 25 mg daily -Continue metoprolol 100 mg daily  Restrictive lung disease Chronic respiratory failure  At home patient is on 2 L supplemental oxygen at night. Last PFTs 10/26/2018, personally reviewed and test met ATS standards, patient was noted to use Spiriva inhaler 2 hours before test and smoked 100 for the test.  No obstruction, mild restriction.  DLCO uncorrected was 55.. Patient heart failure was noted by pulmonologist who evaluated her in 2020 to be the most likely explanation. Patient continues to smoke and recently referred to pulmonology for follow-up. Patient needs new PFT's but will continue home Anoro Ellipta as she may have developed COPD with continued tobacco use. Absolute eosinophils 368 on CBC with diff on 11/13/2018, patient could likely benefit from ICS being added to current inhaler regimen. Patient  also with HRCT in 2020 suggestive of hypersensitivy pneumonitis.  - Continue counseling on smoking cessation - Continue  Anoro Ellipta  Essential HTN Blood pressure 163/98.  Plan: - Hold home lisinopril 40 mg  - Continue metoprolol and spirolactone as above.   DMII Patient's hemoglobin A1c 12/26/2019 9.6.  Patient on Victoza as outpatient. PCP has discussed starting insulin but patient not willing to start. Plan:  - Continue Victoza 1.8mg daily - SSI-S - monitor CBG;s  Tobacco use disorder Patient smokes approximately 5 cigarettes/day currently.  Not interested in quitting at this time Plan: - Nicotine patch, 7mg  Code: Full VTE px: Lovenox Diet: heart healthy / carb modified  Dispo: Admit patient to Inpatient with expected length of stay greater than 2 midnights.  Signed: Jeff Steen, MD PGY1    

## 2020-02-21 NOTE — Plan of Care (Signed)
Patient A&O x4. VSS. Pt on room air sating above 95%. Lungs sounds regular and  diminished/ with fine crackles. Free from falls. Pt turns self in bed.  Pt tolerating fluids and meals.  Pt voiding adequately during shift.DVT prophylactic: lovenox. No c/o of pain. Bed wheels locked. Phone and call bell within reach. Pt is resting, no distress. POC reviewed.    Problem: Education: Goal: Knowledge of General Education information will improve Description: Including pain rating scale, medication(s)/side effects and non-pharmacologic comfort measures Outcome: Progressing   Problem: Health Behavior/Discharge Planning: Goal: Ability to manage health-related needs will improve Outcome: Progressing   Problem: Clinical Measurements: Goal: Ability to maintain clinical measurements within normal limits will improve Outcome: Progressing Goal: Will remain free from infection Outcome: Progressing Goal: Diagnostic test results will improve Outcome: Progressing Goal: Respiratory complications will improve Outcome: Progressing Goal: Cardiovascular complication will be avoided Outcome: Progressing   Problem: Activity: Goal: Risk for activity intolerance will decrease Outcome: Progressing   Problem: Nutrition: Goal: Adequate nutrition will be maintained Outcome: Progressing   Problem: Coping: Goal: Level of anxiety will decrease Outcome: Progressing   Problem: Elimination: Goal: Will not experience complications related to bowel motility Outcome: Progressing Goal: Will not experience complications related to urinary retention Outcome: Progressing   Problem: Pain Managment: Goal: General experience of comfort will improve Outcome: Progressing   Problem: Safety: Goal: Ability to remain free from injury will improve Outcome: Progressing   Problem: Skin Integrity: Goal: Risk for impaired skin integrity will decrease Outcome: Progressing

## 2020-02-21 NOTE — Progress Notes (Signed)
  Echocardiogram 2D Echocardiogram has been performed.  [name] 02/21/2020, 9:22 AM

## 2020-02-21 NOTE — Progress Notes (Signed)
   Subjective:   [name] was seen at bedside this morning. She states that she is doing better today than she was yesterday. We discussed continuing to monitor her I/Os today. All questions and concerns were addressed.   Objective:  Vital signs in last 24 hours: Vitals:   02/21/20 1000 02/21/20 1055 02/21/20 1135 02/21/20 1200  BP: (!) 147/90   (!) 147/85  Pulse: 95 87 97 92  Resp: (!) 23 16 14  (!) 22  Temp: 97.6 F (36.4 C)     TempSrc:      SpO2: 95% 90% 94% 98%  Weight:      Height:       Physical Exam Constitutional:      General: She is not in acute distress.    Appearance: She is normal weight. She is not ill-appearing.  Cardiovascular:     Rate and Rhythm: Normal rate and regular rhythm.  Pulmonary:     Effort: Pulmonary effort is normal.     Comments: Faint Rales appreciated in the lower lobes bilaterally Abdominal:     General: Bowel sounds are normal.     Palpations: Abdomen is soft.  Neurological:     Mental Status: She is alert.  Psychiatric:        Mood and Affect: Mood normal.        Behavior: Behavior normal.     Assessment/Plan:  Principal Problem:   Dyspnea Active Problems:   TOBACCO ABUSE   Essential hypertension   DMII (diabetes mellitus, type 2) (HCC)   Restrictive lung disease   Chronic combined systolic and diastolic congestive heart failure (HCC)  Kirby Funk  69 year old female with hypertension, hyperlipidemia, uncontrolled type 2 diabetes, obesity, COPD with current tobacco use disorder, and combined systolic and diastolic heart failure with ICD who presents with chest pain and shortness of breath.  Dyspnea Acute on Chronic systolic and diastolic congestive heart failure:  Symptomatically better this morning, laying comfortably in bed on 4L O2 via [place] during interview.  - Echo Pending - IV Lasix 40 mg BID - Spironolactone 25 mg QD - Continue metoprolol 100 mg QD  Restrictive lung disease Chronic respiratory failure  At home  patient is on 2 L supplemental oxygen at night. - Continue  Anoro Ellipta  Essential HTN Blood pressure 163/98.  - Hold home lisinopril 40 mg  - Continue metoprolol and spirolactone.  DMII Patient's hemoglobin A1c 12/26/2019 9.6. - Continue Victoza 1.8mg  daily - SSI-S - monitor CBGs  Prior to Admission Living Arrangement: Home Anticipated Discharge Location: Home Barriers to Discharge: Continued Medical Management Dispo: Anticipated discharge in approximately 2-3 day(s).   Maudie Mercury, MD 02/21/2020, 12:49 PM Pager: [phone]

## 2020-02-22 LAB — BASIC METABOLIC PANEL
Anion gap: 14 (ref 5–15)
BUN: 16 mg/dL (ref 8–23)
CO2: 26 mmol/L (ref 22–32)
Calcium: 10 mg/dL (ref 8.9–10.3)
Chloride: 103 mmol/L (ref 98–111)
Creatinine, Ser: 0.85 mg/dL (ref 0.44–1.00)
GFR calc Af Amer: >60 mL/min (ref >60)
GFR calc non Af Amer: >60 mL/min (ref >60)
Glucose, Bld: 156 mg/dL — ABNORMAL HIGH (ref 70–99)
Potassium: 4.3 mmol/L (ref 3.5–5.1)
Sodium: 143 mmol/L (ref 135–145)

## 2020-02-22 LAB — GLUCOSE, CAPILLARY
Glucose-Capillary: 133 mg/dL — ABNORMAL HIGH (ref 70–99)
Glucose-Capillary: 187 mg/dL — ABNORMAL HIGH (ref 70–99)
Glucose-Capillary: 171 mg/dL — ABNORMAL HIGH (ref 70–99)
Glucose-Capillary: 119 mg/dL — ABNORMAL HIGH (ref 70–99)

## 2020-02-22 MED ORDER — MAGNESIUM SULFATE 2 GM/50ML IV SOLN
2.0000 g | Freq: Once | INTRAVENOUS | Status: AC
  Administered 2020-02-22: 2 g via INTRAVENOUS
  Filled 2020-02-22: qty 50

## 2020-02-22 NOTE — Progress Notes (Signed)
Internal Medicine Attending:   I saw and examined the patient. I reviewed Dr Janne Napoleon note and I agree with the resident's findings and plan as documented in the resident's note.

## 2020-02-22 NOTE — Progress Notes (Signed)
   Subjective: Patient states that she is breathing well without shortness of breath. Denies chest pain and is able to ambulate without problem. No light headedness with standing. She states that she lives by herself but is able to get around ok. She is anxious to get out of the hospital to make funeral arrangements for her recently deceased boyfriend.  Discussed plan to continue IV diuresis another day and potentially discharge tomorrow vs Monday.   Objective:  Vital signs in last 24 hours: Vitals:   02/21/20 1615 02/21/20 2025 02/21/20 2028 02/22/20 0500  BP: 127/77 (!) 144/93    Pulse: 82 89    Resp: 20 17    Temp: 97.7 F (36.5 C) 97.9 F (36.6 C) 97.9 F (36.6 C) 98 F (36.7 C)  TempSrc: Oral  Oral   SpO2: 96% 93%    Weight:      Height:       General: awake, alert, sitting up in bed in NAD CV: RRR Pulm: crackles at bases bilaterally  Ext: trace BLE edema   Assessment/Plan:  Principal Problem:   Chronic combined systolic and diastolic congestive heart failure (HCC) Active Problems:   TOBACCO ABUSE   Essential hypertension   DMII (diabetes mellitus, type 2) (HCC)   Restrictive lung disease   Dyspnea  [name] is a48 year old female with hypertension, hyperlipidemia, uncontrolledtype 2 diabetes, obesity,COPD with current tobacco use disorder, andcombined systolic and diastolic heart failure with ICDwho presents with chest pain and shortness of breath.  Dyspnea Acute on Chronic systolic and diastolic congestive heart failure:  Continues to have symptomatic improvement; now oxygenating on room air and only uses O2 at night similar which is what she does at home - repeat echo fairly unchanged from previous, EF 25% - still has pulmonary edema on exam; will plan to diurese with IV lasix 60 mg BID and consider transitioning to PO tomorrow if she appears euvolemic  - strict I&Os; daily weights - renal function and electrolytes stable with diuresis; continue to monitor  daily   - Spironolactone25 mg QD - Continue metoprolol 100 mg QD  Restrictive lung disease Chronic respiratory failure  At home patient is on 2 L supplemental oxygen at night. - Continue Anoro Ellipta  Essential HTN -Hold home lisinopril 40 mg  -Continue metoprolol and spirolactone.  DMII Patient's hemoglobin A1c 12/26/2019 9.6. - Continue Victoza 1.8mg  daily - SSI-S - monitor CBGs  Prior to Admission Living Arrangement: home Anticipated Discharge Location: home Barriers to Discharge: continued medical treatment  Dispo: Anticipated discharge in approximately 1-2 day(s).   Modena Nunnery D, DO 02/22/2020, 6:46 AM Pager: [phone]

## 2020-02-23 ENCOUNTER — Other Ambulatory Visit: Payer: Self-pay | Admitting: Internal Medicine

## 2020-02-23 LAB — BASIC METABOLIC PANEL
Anion gap: 12 (ref 5–15)
BUN: 18 mg/dL (ref 8–23)
CO2: 31 mmol/L (ref 22–32)
Calcium: 10.8 mg/dL — ABNORMAL HIGH (ref 8.9–10.3)
Chloride: 100 mmol/L (ref 98–111)
Creatinine, Ser: 0.96 mg/dL (ref 0.44–1.00)
GFR calc Af Amer: >60 mL/min (ref >60)
GFR calc non Af Amer: >60 mL/min (ref >60)
Glucose, Bld: 176 mg/dL — ABNORMAL HIGH (ref 70–99)
Potassium: 4.4 mmol/L (ref 3.5–5.1)
Sodium: 143 mmol/L (ref 135–145)

## 2020-02-23 LAB — GLUCOSE, CAPILLARY: Glucose-Capillary: 146 mg/dL — ABNORMAL HIGH (ref 70–99)

## 2020-02-23 MED ORDER — SPIRONOLACTONE 25 MG PO TABS: 12.5000 mg | ORAL_TABLET | Freq: Every day | ORAL | 0 refills | Status: DC

## 2020-02-23 MED ORDER — FUROSEMIDE 20 MG PO TABS
60.0000 mg | ORAL_TABLET | Freq: Two times a day (BID) | ORAL | 0 refills | Status: DC
Start: 2020-02-23 — End: 2020-02-24

## 2020-02-23 NOTE — Discharge Summary (Signed)
Name: [name] MRN: [id number] DOB: [birth year] 69 y.o. PCP: Kathi Ludwig, MD  Date of Admission: 02/20/2020  6:13 PM Date of Discharge:  Attending Physician: Velna Ochs, MD  Discharge Diagnosis: 1. Acute on chronic systolic and diastolic congestive heart failure: 2. Restrictive lung disease 3 Essential Hypertension 4. Diabetes Mellitis Type 2  Discharge Medications: Allergies as of 02/23/2020      Reactions   Actos [pioglitazone] Other (See Comments)   REACTION: congestive heart failure   Naproxen Other (See Comments)   562m dose made her sleep for two days   Rosiglitazone Hives   Hydrocodone-acetaminophen Itching   Hydrocortisone Hives, Itching, Other (See Comments)   unknown      Medication List    STOP taking these medications   albuterol (2.5 MG/3ML) 0.083% nebulizer solution Commonly known as: PROVENTIL   lisinopril 40 MG tablet Commonly known as: ZESTRIL   ondansetron 4 MG tablet Commonly known as: ZOFRAN     TAKE these medications   Accu-Chek FastClix Lancet Kit Lancing device, use with lancets for blood glucose monitoring.   Anoro Ellipta 62.5-25 MCG/INH Aepb Generic drug: umeclidinium-vilanterol Inhale 1 puff into the lungs daily.   aspirin EC 81 MG tablet Take 1 tablet (81 mg total) by mouth 2 (two) times daily.   atorvastatin 80 MG tablet Commonly known as: LIPITOR Take 1 tablet (80 mg total) by mouth at bedtime.   calcium-vitamin D 500-200 MG-UNIT tablet Commonly known as: OSCAL WITH D Take 1 tablet by mouth 2 (two) times daily.   clobetasol ointment 0.05 % Commonly known as: TEMOVATE Apply 1 application topically 2 (two) times daily as needed (Skin rash).   Contour Next Monitor w/Device Kit 1 Act by Does not apply route 4 (four) times daily.   furosemide 20 MG tablet Commonly known as: Lasix Take 3 tablets (60 mg total) by mouth 2 (two) times daily. What changed:   medication strength  how much to take     glucose blood test strip Commonly known as: Contour Next Test Use as instructed   Lancets Misc 1 Act by Does not apply route 4 (four) times daily.   metoprolol succinate 50 MG 24 hr tablet Commonly known as: TOPROL-XL Take 2 tablets (100 mg total) by mouth daily.   nitroGLYCERIN 0.4 MG SL tablet Commonly known as: NITROSTAT Place 1 tablet (0.4 mg total) under the tongue every 5 (five) minutes as needed for chest pain. Reported on 03/18/2016   spironolactone 25 MG tablet Commonly known as: ALDACTONE TAKE 0.5 TABLETS BY MOUTH EVERY DAY What changed:   how much to take  how to take this  when to take this  additional instructions   spironolactone 25 MG tablet Commonly known as: ALDACTONE Take 0.5 tablets (12.5 mg total) by mouth daily. Start taking on: Feb 24, 2020 What changed: You were already taking a medication with the same name, and this prescription was added. Make sure you understand how and when to take each.   Synjardy 12.5-500 MG Tabs Generic drug: Empagliflozin-metFORMIN HCl Take 1 tablet by mouth 2 (two) times daily.   Victoza 18 MG/3ML Sopn Generic drug: liraglutide Inject 1.870mdaily into the skin What changed:   how much to take  how to take this  when to take this       Disposition and follow-up:   [facility]eg[facility]  At the hospital follow up visit please address:  1.  Diuretic adjustment, tobacco cessation  2.  Labs / imaging needed at time of follow-up: BMP  3.  Pending labs/ test needing follow-up: None  Follow-up Appointments: Follow-up Information    Kathi Ludwig, MD. Schedule an appointment as soon as possible for a visit in 1 week(s).   Specialty: Internal Medicine Contact information: [place] [id number] [place] [facility] Course by problem list: 1. Acute on chronic systolic and diastolic congestive heart failure  (with ICD placement): Patient presented to the clinic with dyspnea with an elevated BNP, recent reduction in her lasix dosage due to increased creatinine and a Echo in 2018 with an ejection fraction of 25-30%. Her symptoms were thought to be likely due to her lasix dosage being decreased which placed her into fluid overload. She was started on IV lasix with a starting weight of 90.7 kg and was discharged with a dry weight of 87 kg. She was discharged in stable condition with lasix 60 mg two times daily.   2. Restrictive lung disease Patient with home oxygen supplementation of 2L and using anoro ellipita was seen in the emergency department with an increased O2 demand needing 4L supplemental oxygen. Patient was diuresed and weaned back to home O2 requirements. She was also managed with her home anoro ellipta. She was discharged in stable condition on her home oxygen and anoro ellipta.   3 Essential Hypertension Patient was managed with her home dose of metoprolol and spirolactone. Her pressures were well maintained during her admission. Her lisinopril was held during this admission and should be revisited in the outpatient setting.   4. Diabetes Mellitis Type 2: Patient with recent A1c of 9.6 on home medications of Vctoza in the outpatient setting was managed with home dose of victoza and sliding scale insulin.   Discharge Vitals:   BP (!) 148/83   Pulse 83   Temp 97.8 F (36.6 C) (Oral)   Resp 19   Ht 5' 1"  (1.549 m)   Wt 87 kg   SpO2 98%   BMI 36.26 kg/m   Pertinent Labs, Studies, and Procedures:   Ref Range & Units 4 d ago  (02/20/20) 4 d ago  (02/20/20)   Troponin I (High Sensitivity) <18 ng/L 31High   30High     Ref Range & Units 4 d ago  (02/20/20) 1 yr ago  (11/13/18) 2 yr ago  (07/14/17) 2 yr ago  (07/12/17)  B Natriuretic Peptide 0.0 - 100.0 pg/mL 155.4High   124.7High  CM  199.5High   200.6High     CBC Latest Ref Rng & Units 02/21/2020 02/20/2020 08/08/2019  WBC 4.0 - 10.5  K/uL 8.8 8.6 9.0  Hemoglobin 12.0 - 15.0 g/dL 11.8(L) 11.9(L) 12.3  Hematocrit 36.0 - 46.0 % 38.4 38.9 39.7  Platelets 150 - 400 K/uL 198 226 251   BMP Latest Ref Rng & Units 02/23/2020 02/22/2020 02/21/2020  Glucose 70 - 99 mg/dL 176(H) 156(H) 190(H)  BUN 8 - 23 mg/dL 18 16 19   Creatinine 0.44 - 1.00 mg/dL 0.96 0.85 0.88  BUN/Creat Ratio 12 - 28 - - -  Sodium 135 - 145 mmol/L 143 143 141  Potassium 3.5 - 5.1 mmol/L 4.4 4.3 3.6  Chloride 98 - 111 mmol/L 100 103 103  CO2 22 - 32 mmol/L 31 26 28   Calcium 8.9 - 10.3 mg/dL 10.8(H) 10.0 8.7(L)      Discharge Instructions: Discharge Instructions    (HEART  FAILURE PATIENTS) Call MD:  Anytime you have any of the following symptoms: 1) 3 pound weight gain in 24 hours or 5 pounds in 1 week 2) shortness of breath, with or without a dry hacking cough 3) swelling in the hands, feet or stomach 4) if you have to sleep on extra pillows at night in order to breathe.   Complete by: As directed    Call MD for:  extreme fatigue   Complete by: As directed    Call MD for:  persistant dizziness or light-headedness   Complete by: As directed    Diet - low sodium heart healthy   Complete by: As directed    Increase activity slowly   Complete by: As directed       Signed: Maudie Mercury, MD 02/23/2020, 10:27 AM   Pager: [phone]

## 2020-02-23 NOTE — Progress Notes (Signed)
Patient IV infiltrated, per Winter,MD ok to not have Iv access at this time. Planning for discharge today.

## 2020-02-23 NOTE — Discharge Instructions (Signed)
To [name],  It was a pleasure taking care of you during your stay at [facility]. You were diagnosed with a heart failure exacerbation. You were given medications to [name] your extra fluid. Please follow up with your primary care physician and make an appointment to follow up on your heart. Your dose of Lasix will be changed. You will take 60 mg of Lasix (3 pills two times a day). Please pick up your new Lasix prescription today.     Heart Failure, Self Care Heart failure is a serious condition. This sheet explains things you need to do to take care of yourself at home. To help you stay as healthy as possible, you may be asked to change your diet, take certain medicines, and make other changes in your life. Your doctor may also give you more specific instructions. If you have problems or questions, call your doctor. What are the risks? Having heart failure makes it more likely for you to have some problems. These problems can get worse if you do not take good care of yourself. Problems may include:  Blood clotting problems. This may cause a stroke.  Damage to the kidneys, liver, or lungs.  Abnormal heart rhythms. Supplies needed:  Scale for weighing yourself.  Blood pressure monitor.  Notebook.  Medicines. How to care for yourself when you have heart failure Medicines Take over-the-counter and prescription medicines only as told by your doctor. Take your medicines every day.  Do not stop taking your medicine unless your doctor tells you to do so.  Do not skip any medicines.  Get your prescriptions refilled before you run out of medicine. This is important. Eating and drinking   Eat heart-healthy foods. Talk with a diet specialist (dietitian) to create an eating plan.  Choose foods that: ? Have no trans fat. ? Are low in saturated fat and cholesterol.  Choose healthy foods, such as: ? Fresh or frozen fruits and vegetables. ? Fish. ? Low-fat (lean) meats. ? Legumes, such  as beans, peas, and lentils. ? Fat-free or low-fat dairy products. ? Whole-grain foods. ? High-fiber foods.  Limit salt (sodium) if told by your doctor. Ask your diet specialist to tell you which seasonings are healthy for your heart.  Cook in healthy ways instead of frying. Healthy ways of cooking include roasting, grilling, broiling, baking, poaching, steaming, and stir-frying.  Limit how much fluid you drink, if told by your doctor. Alcohol use  Do not drink alcohol if: ? Your doctor tells you not to drink. ? Your heart was damaged by alcohol, or you have very bad heart failure. ? You are pregnant, may be pregnant, or are planning to become pregnant.  If you drink alcohol: ? Limit how much you use to:  0-1 drink a day for women.  0-2 drinks a day for men. ? Be aware of how much alcohol is in your drink. In the [place] one drink equals one 12 oz bottle of beer (355 mL), one 5 oz glass of wine (148 mL), or one 1 oz glass of hard liquor (44 mL). Lifestyle   Do not use any products that contain nicotine or tobacco, such as cigarettes, e-cigarettes, and chewing tobacco. If you need help quitting, ask your doctor. ? Do not use nicotine gum or patches before talking to your doctor.  Do not use illegal drugs.  [name] weight if told by your doctor.  Do physical activity if told by your doctor. Talk to your doctor before you  begin an exercise if: ? You are an older adult. ? You have very bad heart failure.  Learn to manage stress. If you need help, ask your doctor.  Get rehab (rehabilitation) to help you stay independent and to help with your quality of life.  Plan time to rest when you get tired. Check weight and blood pressure   Weigh yourself every day. This will help you to know if fluid is building up in your body. ? Weigh yourself every morning after you pee (urinate) and before you eat breakfast. ? Wear the same amount of clothing each time. ? Write down your daily  weight. Give your record to your doctor.  Check and write down your blood pressure as told by your doctor.  Check your pulse as told by your doctor. Dealing with very hot and very cold weather  If it is very hot: ? Avoid activities that take a lot of energy. ? Use air conditioning or fans, or find a cooler place. ? Avoid caffeine and alcohol. ? Wear clothing that is loose-fitting, lightweight, and light-colored.  If it is very cold: ? Avoid activities that take a lot of energy. ? Layer your clothes. ? Wear mittens or gloves, a hat, and a scarf when you go outside. ? Avoid alcohol. Follow these instructions at home:  Stay up to date with shots (vaccines). Get pneumococcal and flu (influenza) shots.  Keep all follow-up visits as told by your doctor. This is important. Contact a doctor if:  You gain weight quickly.  You have increasing shortness of breath.  You cannot do your normal activities.  You get tired easily.  You cough a lot.  You don't feel like eating or feel like you may vomit (nauseous).  You become puffy (swell) in your hands, feet, ankles, or belly (abdomen).  You cannot sleep well because it is hard to breathe.  You feel like your heart is beating fast (palpitations).  You get dizzy when you stand up. Get help right away if:  You have trouble breathing.  You or someone else notices a change in your behavior, such as having trouble staying awake.  You have chest pain or discomfort.  You pass out (faint). These symptoms may be an emergency. Do not wait to see if the symptoms will go away. Get medical help right away. Call your local emergency services (911 in the [place] Do not drive yourself to the hospital. Summary  Heart failure is a serious condition. To care for yourself, you may have to change your diet, take medicines, and make other lifestyle changes.  Take your medicines every day. Do not stop taking them unless your doctor tells you to do  so.  Eat heart-healthy foods, such as fresh or frozen fruits and vegetables, fish, lean meats, legumes, fat-free or low-fat dairy products, and whole-grain or high-fiber foods.  Ask your doctor if you can drink alcohol. You may have to stop alcohol use if you have very bad heart failure.  Contact your doctor if you gain weight quickly or feel that your heart is beating too fast. Get help right away if you pass out, or have chest pain or trouble breathing. This information is not intended to replace advice given to you by your health care provider. Make sure you discuss any questions you have with your health care provider. Document Revised: 01/01/2019 Document Reviewed: 01/02/2019 Elsevier Patient Education  [employer].

## 2020-02-23 NOTE — Evaluation (Signed)
Physical Therapy Evaluation Patient Details Name: [name] MRN: [id number] DOB: Jan 27, 1951 Today's Date: 02/23/2020   History of Present Illness  69 year old female with hypertension, hyperlipidemia, uncontrolled type 2 diabetes, obesity, COPD with current tobacco use disorder, and combined systolic and diastolic heart failure with ICD who presented with chest pain and shortness of breath.    Clinical Impression  PT eval complete. Pt demonstrated independence with bed mobility and transfers. Supervision provided for ambulation 75' without AD. Steady gait noted. Ambulated on RA with SpO2 > 90%. Plan is for d/c home today. No further PT intervention indicated. PT signing off.    Follow Up Recommendations No PT follow up;Supervision - Intermittent    Equipment Recommendations  None recommended by PT    Recommendations for Other Services       Precautions / Restrictions Precautions Precautions: Other (comment) Precaution Comments: watch sats      Mobility  Bed Mobility Overal bed mobility: Independent                Transfers Overall transfer level: Independent Equipment used: None                Ambulation/Gait Ambulation/Gait assistance: Supervision Gait Distance (Feet): 75 Feet Assistive device: None Gait Pattern/deviations: WFL(Within Functional Limits) Gait velocity: WFL Gait velocity interpretation: 1.31 - 2.62 ft/sec, indicative of limited community ambulator General Gait Details: Ambulated on RA with SpO2 > 90%. Steady gait. No LOB. No SOB noted.  Stairs            Wheelchair Mobility    Modified Rankin (Stroke Patients Only)       Balance Overall balance assessment: Mild deficits observed, not formally tested                                           Pertinent Vitals/Pain Pain Assessment: No/denies pain    Home Living Family/patient expects to be discharged to:: Private residence Living Arrangements:  Alone Available Help at Discharge: Family;Available PRN/intermittently Type of Home: House Home Access: Stairs to enter Entrance Stairs-Rails: Psychiatric nurse of Steps: 4 Home Layout: One level Home Equipment: Walker - 4 wheels;Tub bench;Cane - single point Additional Comments: Pt's live in boyfriend passed away this past week.    Prior Function Level of Independence: Independent         Comments: uses motorized scooters in grocery store. Does not drive. Reports using 2L O2 at night.     Hand Dominance   Dominant Hand: Right    Extremity/Trunk Assessment   Upper Extremity Assessment Upper Extremity Assessment: Overall WFL for tasks assessed    Lower Extremity Assessment Lower Extremity Assessment: Overall WFL for tasks assessed    Cervical / Trunk Assessment Cervical / Trunk Assessment: Normal  Communication   Communication: No difficulties  Cognition Arousal/Alertness: Awake/alert Behavior During Therapy: WFL for tasks assessed/performed Overall Cognitive Status: Within Functional Limits for tasks assessed                                        General Comments General comments (skin integrity, edema, etc.): VSS on RA    Exercises     Assessment/Plan    PT Assessment Patent does not need any further PT services  PT Problem List  PT Treatment Interventions      PT Goals (Current goals can be found in the Care Plan section)  Acute Rehab PT Goals Patient Stated Goal: home today PT Goal Formulation: All assessment and education complete, DC therapy    Frequency     Barriers to discharge        Co-evaluation               AM-PAC PT "6 Clicks" Mobility  Outcome Measure Help needed turning from your back to your side while in a flat bed without using bedrails?: None Help needed moving from lying on your back to sitting on the side of a flat bed without using bedrails?: None Help needed moving to and  from a bed to a chair (including a wheelchair)?: None Help needed standing up from a chair using your arms (e.g., wheelchair or bedside chair)?: None Help needed to walk in hospital room?: None Help needed climbing 3-5 steps with a railing? : A Little 6 Click Score: 23    End of Session   Activity Tolerance: Patient tolerated treatment well Patient left: in bed;with call bell/phone within reach Nurse Communication: Mobility status PT Visit Diagnosis: Difficulty in walking, not elsewhere classified (R26.2)    Time: 5927-6394 PT Time Calculation (min) (ACUTE ONLY): 10 min   Charges:   PT Evaluation $PT Eval Low Complexity: 1 Low          Lorrin Goodell, PT  Office # [phone] Pager [phone]   Lorriane Shire 02/23/2020, 12:37 PM

## 2020-02-23 NOTE — Progress Notes (Signed)
[name] to be D/C'd Home per MD order.  Discussed with the patient and all questions fully answered.  VSS, Skin clean, dry and intact without evidence of skin break down, no evidence of skin tears noted. IV catheter discontinued intact. Site without signs and symptoms of complications. Dressing and pressure applied.  An After Visit Summary was printed and given to the patient. Patient received prescription.  D/c education completed with patient/family including follow up instructions, medication list, d/c activities limitations if indicated, with other d/c instructions as indicated by MD - patient able to verbalize understanding, all questions fully answered.   Patient instructed to return to ED, call 911, or call MD for any changes in condition.   Patient escorted via [place], and D/C home via private auto.  [name] 02/23/2020 1:35 PM

## 2020-02-27 DIAGNOSIS — I509 Heart failure, unspecified: Secondary | ICD-10-CM | POA: Diagnosis not present

## 2020-02-27 DIAGNOSIS — J449 Chronic obstructive pulmonary disease, unspecified: Secondary | ICD-10-CM | POA: Diagnosis not present

## 2020-03-03 ENCOUNTER — Other Ambulatory Visit: Payer: Self-pay

## 2020-03-03 ENCOUNTER — Ambulatory Visit (INDEPENDENT_AMBULATORY_CARE_PROVIDER_SITE_OTHER): Payer: Medicare Other | Admitting: Emergency Medicine

## 2020-03-03 ENCOUNTER — Other Ambulatory Visit: Payer: Self-pay | Admitting: Internal Medicine

## 2020-03-03 DIAGNOSIS — Z95810 Presence of automatic (implantable) cardiac defibrillator: Secondary | ICD-10-CM | POA: Diagnosis not present

## 2020-03-03 DIAGNOSIS — I5042 Chronic combined systolic (congestive) and diastolic (congestive) heart failure: Secondary | ICD-10-CM | POA: Diagnosis not present

## 2020-03-03 LAB — CUP PACEART INCLINIC DEVICE CHECK
Battery Remaining Longevity: 54 mo
Brady Statistic RV Percent Paced: 0 %
Date Time Interrogation Session: 20210601084518
HighPow Impedance: 47.7703
Implantable Lead Implant Date: 20080801
Implantable Lead Location: 753860
Implantable Lead Model: 7120
Implantable Pulse Generator Implant Date: 20150701
Lead Channel Impedance Value: 437.5 Ohm
Lead Channel Pacing Threshold Amplitude: 0.75 V
Lead Channel Pacing Threshold Pulse Width: 0.5 ms
Lead Channel Sensing Intrinsic Amplitude: 11.9 mV
Lead Channel Setting Pacing Amplitude: 2.5 V
Lead Channel Setting Pacing Pulse Width: 0.5 ms
Lead Channel Setting Sensing Sensitivity: 0.5 mV
Pulse Gen Serial Number: 7206538

## 2020-03-03 NOTE — Progress Notes (Signed)
ICD check in clinic. Normal device function. Thresholds and sensing consistent with previous device measurements. Impedance trends stable over time. No mode switches. No ventricular arrhythmias. Histogram distribution appropriate for patient and level of activity. No changes made this session. Device programmed at appropriate safety margins. Device programmed to optimize intrinsic conduction. Estimated longevity 4 yrs 6 months. Pt is not enrolled in remote follow-up. In clinic check in 3 months on 06/04/20. Patient education completed including shock plan.

## 2020-03-04 ENCOUNTER — Ambulatory Visit (INDEPENDENT_AMBULATORY_CARE_PROVIDER_SITE_OTHER): Payer: Medicare Other | Admitting: Internal Medicine

## 2020-03-04 ENCOUNTER — Other Ambulatory Visit: Payer: Self-pay | Admitting: Internal Medicine

## 2020-03-04 ENCOUNTER — Encounter: Payer: Self-pay | Admitting: Internal Medicine

## 2020-03-04 DIAGNOSIS — I5042 Chronic combined systolic (congestive) and diastolic (congestive) heart failure: Secondary | ICD-10-CM

## 2020-03-04 MED ORDER — LOSARTAN POTASSIUM 25 MG PO TABS: 25.0000 mg | ORAL_TABLET | Freq: Every day | ORAL | 1 refills | Status: DC

## 2020-03-04 NOTE — Progress Notes (Signed)
[facility] Residency Telephone Encounter Continuity Care Appointment  HPI:   This telephone encounter was created for [name] on 03/04/2020 for the following purpose/cc hospital discharge follow-up for a heart failure exacerbation.   Past Medical History:  Past Medical History:  Diagnosis Date  . ATN (acute tubular necrosis) ([place]) 07/15/2014  . Automatic implantable cardioverter-defibrillator in situ   . Automatic implantable cardioverter-defibrillator in situ 05/04/2007   Qualifier: Diagnosis of  By: Isla Pence    . Cardiac defibrillator in situ    Atlas II VR ([facility]) implanted by Dr Lovena Le  . CHF (congestive heart failure) ([place])   . Chronic combined systolic and diastolic heart failure (HCC)    a. EF 35-40% in past;  b. Echo 7/13:  EF 45-50%, Gr 2 diast dysfn, mild AI, mild MAC, trivial MR, mild LAE, PASP 47.  [name] Chronic ulcer of leg ([place])    04-09-15 resolved-not a problem.  . Colon polyps 04/12/2013   Rectosigmoid polyp  . COPD (chronic obstructive pulmonary disease) (HCC)    O2 at night  . Depression   . Diabetes mellitus   . Diabetes mellitus, type 2 ([place]) 04/03/2012   HIGH RISK FEET.. Please have patient take shoes and socks off every visit for visual foot inspection.    . Eczema   . Elevated alkaline phosphatase level    GGT and 5'nucleotidase 8/13 normal  . Health care maintenance 01/22/2013   Surgically absent cervix- no pap needed (Path report 07/2000: uterine body with attached bilateral adnexa and separate cervix.)  . History of oxygen administration    oxygen @ 2 l/m nasally bedtime 24/7  . HTN (hypertension)   . Hx of cardiac cath    a. [place] 2003 normal;  b. LHC 6/13:  Mild calcification in the LM, o/w normal coronary arteries, EF 45%.   . Hyperkalemia 08/08/2017  . Hyperlipidemia   . Hyperlipidemia associated with type 2 diabetes mellitus ([place]) 05/25/2007   Qualifier: Diagnosis of  By: Hassell Done FNP, Tori Milks    . Hyperthyroidism,  subclinical   . HYPERTHYROIDISM, SUBCLINICAL 05/06/2009   Qualifier: Diagnosis of  By: Darrick Meigs    . Implantable cardioverter-defibrillator (ICD) generator end of life   . Lipoma   . Need for hepatitis C screening test 04/25/2019  . NICM (nonischemic cardiomyopathy) ([place])   . Obesity   . On home oxygen therapy    "2L; 24/7" (10/11/2016)  . Post menopausal syndrome   . Shortness of breath 07/14/2017  . Skin rash 07/12/2018  . Sleep apnea    pt denies 04/12/2013      ROS:   See A/P   Heart failure discharge follow/up: She stated that overall she feels well. She denied orthopnea, dyspnea of pedal edema. She is urinating with increased frequency as compared to the prior dose 29m. She was reduced from 870mBID to 4021mID due to the addition of an SGLT2i, an increase in her sCr and symptomatic dehydration. Unfortunately, this dose was not sufficient and it led to increased volume. Her lisinopril was stopped during her admission due reportedly as a result of near normal blood pressure although her discharge BP was 148/83 and she has heart failure. She confirmed that she is holding the lisinopril.  She reports today that her weight is 190lb which is her dry weight. She is taking her lasix at the 64m89mD dose. Denied palpitations, chest pain or cough.  Her renal function stabilized prior to discharge as below.  BMP Latest Ref Rng & Units 02/23/2020 02/22/2020 02/21/2020  Glucose 70 - 99 mg/dL 176(H) 156(H) 190(H)  BUN 8 - 23 mg/dL _0 Creatinine 0.44 - 1.00 mg/dL 0.96 0.85 0.88  BUN/Creat Ratio 12 - 28 - - -  Sodium 135 - 145 mmol/L 143 143 141  Potassium 3.5 - 5.1 mmol/L 4.4 4.3 3.6  Chloride 98 - 111 mmol/L 100 103 103  CO2 22 - 32 mmol/L _1 Calcium 8.9 - 10.3 mg/dL 10.8(H) 10.0 8.7(L)   Plan: Agreed to start Losartan 3m daily Recommend starting Entresto and uptitrating as permitted at her next visit  Continue spironolactone 12.553mdaily Continue metoprolol 10059mdaily Continue empagliflozin and victoza BMP in 1-2 weeks Continue lasix 46m63mD, seems to be the better of the doses  Assessment / Plan / Recommendations:   Please see A&P under problem oriented charting for assessment of the patient's acute and chronic medical conditions.   As always, pt is advised that if symptoms worsen or new symptoms arise, they should go to an urgent care facility or to to ER for further evaluation.   Consent and Medical Decision Making:   Patient discussed with Dr. E. HAngelia Mouldis is a telephone encounter between [name]6/11/2019 for heart failure admission. The visit was conducted with the patient located at home and [facility]'s identity was confirmed using their DOB and current address. The patient has consented to being evaluated through a telephone encounter and understands the associated risks (an examination cannot be done and the patient may need to come in for an appointment) / benefits (allows the patient to remain at home, decreasing exposure to coronavirus). I personally spent 7 minutes on medical discussion.

## 2020-03-04 NOTE — Addendum Note (Signed)
Addended by: Nicola Girt on: 03/04/2020 02:26 PM   Modules accepted: Orders

## 2020-03-05 ENCOUNTER — Ambulatory Visit: Payer: Self-pay | Admitting: *Deleted

## 2020-03-05 NOTE — Chronic Care Management (AMB) (Signed)
  Chronic Care Management   Note  03/05/2020 Name: [name] MRN: [id number] DOB: 02/13/1951  Unsuccessful outreach to patient after referral received r/t [facility] D/C Red Flag (see image below) and referral from [facility] hospital liaison Natividad Brood to discuss chronic care management  program with patient.  Patient was recently hospitalized at [facility] form 5/20-5/23 for heart failure. She completed a virtual visit with her primary care provider on 03/04/20.    Follow up plan: A HIPPA compliant phone message was left for the patient providing contact information and requesting a return call.  The care management team will reach out to the patient again over the next 7 days.   Kelli Churn RN, CCM, [facility] RN Care Manager [phone]

## 2020-03-06 ENCOUNTER — Encounter: Payer: Medicare Other | Admitting: Internal Medicine

## 2020-03-06 ENCOUNTER — Ambulatory Visit: Payer: Self-pay | Admitting: *Deleted

## 2020-03-06 DIAGNOSIS — I5042 Chronic combined systolic (congestive) and diastolic (congestive) heart failure: Secondary | ICD-10-CM

## 2020-03-06 DIAGNOSIS — I10 Essential (primary) hypertension: Secondary | ICD-10-CM

## 2020-03-06 DIAGNOSIS — J449 Chronic obstructive pulmonary disease, unspecified: Secondary | ICD-10-CM

## 2020-03-06 DIAGNOSIS — E1142 Type 2 diabetes mellitus with diabetic polyneuropathy: Secondary | ICD-10-CM

## 2020-03-06 NOTE — Chronic Care Management (AMB) (Signed)
  Chronic Care Management   Outreach Note  03/06/2020 Name: [name] MRN: [id number] DOB: September 29, 1951  Referred by: Kathi Ludwig, MD Reason for referral : Chronic Care Management [facility] Discharge Red Flag Follow Up )   A second unsuccessful telephone outreach was attempted today to discuss chronic care management program and follow up on [place] Discharge Red Flags. (see below) . The patient was referred to the case management team for assistance with care management and care coordination.     Follow Up Plan: A HIPPA compliant phone message was left for the patient providing contact information and requesting a return call.  If patient does not return call, the care management team will reach out to the patient again over the next 7 days.   Kelli Churn RN, CCM, [facility] RN Care Manager [phone]

## 2020-03-09 ENCOUNTER — Ambulatory Visit: Payer: Medicare Other | Admitting: *Deleted

## 2020-03-09 ENCOUNTER — Ambulatory Visit: Payer: Medicare Other

## 2020-03-09 DIAGNOSIS — I10 Essential (primary) hypertension: Secondary | ICD-10-CM

## 2020-03-09 DIAGNOSIS — E1142 Type 2 diabetes mellitus with diabetic polyneuropathy: Secondary | ICD-10-CM

## 2020-03-09 DIAGNOSIS — I5042 Chronic combined systolic (congestive) and diastolic (congestive) heart failure: Secondary | ICD-10-CM

## 2020-03-09 NOTE — Chronic Care Management (AMB) (Addendum)
Chronic Care Management    Clinical Social Work General Note  03/09/2020 Name: [name] MRN: [id number] DOB: 1951/04/09  [name] is a 69 y.o. year old female who is a primary care patient of Peggy Ludwig, MD. The CCM was consulted to assist the patient with housing.   [name] was given information about Chronic Care Management services today including:  1. CCM service includes personalized support from designated clinical staff supervised by her physician, including individualized plan of care and coordination with other care providers 2. 24/7 contact phone numbers for assistance for urgent and routine care needs. 3. Service will only be billed when office clinical staff spend 20 minutes or more in a month to coordinate care. 4. Only one practitioner may furnish and bill the service in a calendar month. 5. The patient may stop CCM services at any time (effective at the end of the month) by phone call to the office staff. 6. The patient will be responsible for cost sharing (co-pay) of up to 20% of the service fee (after annual deductible is met).  Patient agreed to services and verbal consent obtained.   Review of patient status, including review of consultants reports, relevant laboratory and other test results, and collaboration with appropriate care team members and the patient's provider was performed as part of comprehensive patient evaluation and provision of chronic care management services.    SDOH (Social Determinants of Health) assessments and interventions performed:  Yes    Outpatient Encounter Medications as of 03/09/2020  Medication Sig  . aspirin EC 81 MG tablet Take 1 tablet (81 mg total) by mouth 2 (two) times daily.  [name] atorvastatin (LIPITOR) 80 MG tablet Take 1 tablet (80 mg total) by mouth at bedtime.  . Blood Glucose Monitoring Suppl (CONTOUR NEXT MONITOR) w/Device KIT 1 Act by Does not apply route 4 (four) times daily.  . calcium-vitamin D (OSCAL WITH D)  500-200 MG-UNIT tablet Take 1 tablet by mouth 2 (two) times daily.  . clobetasol ointment (TEMOVATE) 8.84 % Apply 1 application topically 2 (two) times daily as needed (Skin rash).   . Empagliflozin-metFORMIN HCl (SYNJARDY) 12.5-500 MG TABS Take 1 tablet by mouth 2 (two) times daily.  . furosemide (LASIX) 20 MG tablet TAKE 3 TABLETS(60 MG) BY MOUTH TWICE DAILY  . glucose blood (CONTOUR NEXT TEST) test strip Use as instructed  . Lancets Misc. (ACCU-CHEK FASTCLIX LANCET) KIT Lancing device, use with lancets for blood glucose monitoring.  . Lancets MISC 1 Act by Does not apply route 4 (four) times daily.  [name] liraglutide (VICTOZA) 18 MG/3ML SOPN Inject 1.24m daily into the skin (Patient taking differently: Inject 1.8 mg into the skin daily. Inject 1.823mdaily into the skin)  . losartan (COZAAR) 25 MG tablet TAKE 1 TABLET(25 MG) BY MOUTH DAILY  . metoprolol succinate (TOPROL-XL) 50 MG 24 hr tablet Take 2 tablets (100 mg total) by mouth daily.  . nitroGLYCERIN (NITROSTAT) 0.4 MG SL tablet Place 1 tablet (0.4 mg total) under the tongue every 5 (five) minutes as needed for chest pain. Reported on 03/18/2016  . spironolactone (ALDACTONE) 25 MG tablet TAKE 0.5 TABLETS BY MOUTH EVERY DAY (Patient taking differently: Take 12.5 mg by mouth daily. )  . umeclidinium-vilanterol (ANORO ELLIPTA) 62.5-25 MCG/INH AEPB Inhale 1 puff into the lungs daily.   No facility-administered encounter medications on file as of 03/09/2020.    Goals Addressed            This Visit's Progress   . "  My boyfriend passed away recently and I can't afford to stay in my house" (pt-stated)       Current Barriers:  [name] Knowledge Barriers related to resources and support available to address needs related to Housing barriers  Case Manager Clinical Goal(s):  [name] Over the next 180 days, patient will work with BSW to address needs related to Housing barriers . Over the next 180 days, BSW will collaborate with RN Care Manager to address care  management and care coordination needs  Interventions:  . Patient interviewed and appropriate assessments performed . Mailed list of properties from [employer], Affordable Housing Management, and senior living options.  [name] with care guide team regarding assistance with housing resources . Collaborated with RN Care Manager and patient to establish an individualized plan of care   Patient Self Care Activities:  . Patient verbalizes understanding of plan to work with BSW and Care Guide for housing resources.   . Patient plans to complete application for any property that she wishes to pursue  Please see past updates related to this goal by clicking on the "Past Updates" button in the selected goal          Follow Up Plan: SW will follow up with patient by phone over the next two weeks         [place], [place] Worker [place] [phone]

## 2020-03-09 NOTE — Chronic Care Management (AMB) (Signed)
Chronic Care Management   Initial Visit Note  03/09/2020 Name: [name] MRN: [id number] DOB: 23-Feb-1951  Referred by: Kathi Ludwig, MD Reason for referral : Chronic Care Management (HTN, HF, DM)   [name] is a 69 y.o. year old female who is a primary care patient of Harbrecht, Purcell Nails, MD. The CCM team was consulted for assistance with chronic disease management and care coordination needs related to CHF, HTN, HLD, COPD, DMII and Depression  Review of patient status, including review of consultants reports, relevant laboratory and other test results, and collaboration with appropriate care team members and the patient's provider was performed as part of comprehensive patient evaluation and provision of chronic care management services.    SDOH (Social Determinants of Health) assessments performed: No See Care Plan activities for detailed interventions related to SDOH     Medications: Outpatient Encounter Medications as of 03/09/2020  Medication Sig  . aspirin EC 81 MG tablet Take 1 tablet (81 mg total) by mouth 2 (two) times daily.  [name] atorvastatin (LIPITOR) 80 MG tablet Take 1 tablet (80 mg total) by mouth at bedtime.  . Blood Glucose Monitoring Suppl (CONTOUR NEXT MONITOR) w/Device KIT 1 Act by Does not apply route 4 (four) times daily.  . calcium-vitamin D (OSCAL WITH D) 500-200 MG-UNIT tablet Take 1 tablet by mouth 2 (two) times daily.  . clobetasol ointment (TEMOVATE) 4.91 % Apply 1 application topically 2 (two) times daily as needed (Skin rash).   . Empagliflozin-metFORMIN HCl (SYNJARDY) 12.5-500 MG TABS Take 1 tablet by mouth 2 (two) times daily.  . furosemide (LASIX) 20 MG tablet TAKE 3 TABLETS(60 MG) BY MOUTH TWICE DAILY  . glucose blood (CONTOUR NEXT TEST) test strip Use as instructed  . Lancets Misc. (ACCU-CHEK FASTCLIX LANCET) KIT Lancing device, use with lancets for blood glucose monitoring.  . Lancets MISC 1 Act by Does not apply route 4 (four) times daily.    [name] liraglutide (VICTOZA) 18 MG/3ML SOPN Inject 1.64m daily into the skin (Patient taking differently: Inject 1.8 mg into the skin daily. Inject 1.839mdaily into the skin)  . losartan (COZAAR) 25 MG tablet TAKE 1 TABLET(25 MG) BY MOUTH DAILY  . metoprolol succinate (TOPROL-XL) 50 MG 24 hr tablet Take 2 tablets (100 mg total) by mouth daily.  . nitroGLYCERIN (NITROSTAT) 0.4 MG SL tablet Place 1 tablet (0.4 mg total) under the tongue every 5 (five) minutes as needed for chest pain. Reported on 03/18/2016  . spironolactone (ALDACTONE) 25 MG tablet TAKE 0.5 TABLETS BY MOUTH EVERY DAY (Patient taking differently: Take 12.5 mg by mouth daily. )  . umeclidinium-vilanterol (ANORO ELLIPTA) 62.5-25 MCG/INH AEPB Inhale 1 puff into the lungs daily.   No facility-administered encounter medications on file as of 03/09/2020.     Objective:  Lab Results  Component Value Date   HGBA1C 9.6 (A) 12/26/2019   HGBA1C 9.1 (H) 08/08/2019   HGBA1C 8.7 (A) 04/24/2019   Lab Results  Component Value Date   MICROALBUR 23.57 (H) 04/08/2014   LDLCALC 55 04/24/2019   CREATININE 0.96 02/23/2020   Wt Readings from Last 3 Encounters:  02/23/20 191 lb 14.4 oz (87 kg)  02/12/20 200 lb 11.2 oz (91 kg)  01/09/20 198 lb 4.8 oz (89.9 kg)   BP Readings from Last 3 Encounters:  02/23/20 (!) 148/83  02/12/20 (!) 163/73  01/09/20 136/72    Goals Addressed            This Visit's Progress  Patient Stated   . "I need to find a smaller place to live; I can't afford to keep renting this house now that my boyfriend has died. " (pt-stated)       [place] (see longitudinal plan of care for additional care plan information)   Current Barriers:  . Chronic Disease Management support, education, and care coordination needs related to CHF, HTN, HLD, COPD, DMII, and Depression  Case Manager Clinical Goal(s):  [name] Over the next 180 days, patient will work with BSW to address needs related to Housing barriers in patient  with CHF, HTN, HLD, COPD, DMII, and Depression  Interventions:  . Collaborated with BSW to initiate plan of care to address needs related to Housing barriers in patient with CHF, HTN, HLD, COPD, DMII, and Depression  Patient Self Care Activities:  . Patient verbalizes understanding of plan to work with BSW to address housing barriers.  Initial goal documentation         [name] was given information about Chronic Care Management services today including:  1. CCM service includes personalized support from designated clinical staff supervised by her physician, including individualized plan of care and coordination with other care providers 2. 24/7 contact phone numbers for assistance for urgent and routine care needs. 3. Service will only be billed when office clinical staff spend 20 minutes or more in a month to coordinate care. 4. Only one practitioner may furnish and bill the service in a calendar month. 5. The patient may stop CCM services at any time (effective at the end of the month) by phone call to the office staff. 6. The patient will be responsible for cost sharing (co-pay) of up to 20% of the service fee (after annual deductible is met).  Patient agreed to services and verbal consent obtained.   Plan:   The care management team will reach out to the patient again over the next 7-14 days.   Kelli Churn RN, CCM, [facility] RN Care Manager [phone]

## 2020-03-09 NOTE — Patient Instructions (Signed)
Visit Information  Goals Addressed            This Visit's Progress     Patient Stated   . "I need to find a smaller place to live; I can't afford to keep renting this house now that my boyfriend has died. " (pt-stated)       [place] (see longtitudinal plan of care for additional care plan information)   Current Barriers:  . Chronic Disease Management support, education, and care coordination needs related to CHF, HTN, HLD, COPD, DMII, and Depression  Case Manager Clinical Goal(s):  [name] Over the next 180 days, patient will work with BSW to address needs related to Housing barriers in patient with CHF, HTN, HLD, COPD, DMII, and Depression  Interventions:  . Collaborated with BSW to initiate plan of care to address needs related to Housing barriers in patient with CHF, HTN, HLD, COPD, DMII, and Depression  Patient Self Care Activities:  . Patient verbalizes understanding of plan to work with BSW to address housing barriers.  Initial goal documentation        [name] was given information about Chronic Care Management services today including:  1. CCM service includes personalized support from designated clinical staff supervised by her physician, including individualized plan of care and coordination with other care providers 2. 24/7 contact phone numbers for assistance for urgent and routine care needs. 3. Service will only be billed when office clinical staff spend 20 minutes or more in a month to coordinate care. 4. Only one practitioner may furnish and bill the service in a calendar month. 5. The patient may stop CCM services at any time (effective at the end of the month) by phone call to the office staff. 6. The patient will be responsible for cost sharing (co-pay) of up to 20% of the service fee (after annual deductible is met).  Patient agreed to services and verbal consent obtained.   The patient verbalized understanding of instructions provided today and  declined a print copy of patient instruction materials.   The care management team will reach out to the patient again over the next 7-14 days.   Kelli Churn RN, CCM, [facility] RN Care Manager [phone]

## 2020-03-09 NOTE — Progress Notes (Signed)
Internal Medicine Clinic Attending  Case discussed with Dr. Harbrecht at the time of the visit.  We reviewed the resident's history and exam and pertinent patient test results.  I agree with the assessment, diagnosis, and plan of care documented in the resident's note.   

## 2020-03-09 NOTE — Patient Instructions (Addendum)
Licensed Clinical Social Worker Visit Information  Goals we discussed today:  Goals Addressed            This Visit's Progress   . "My boyfriend passed away recently and I can't afford to stay in my house" (pt-stated)       Current Barriers:  [name] Kitchen Knowledge Barriers related to resources and support available to address needs related to Housing barriers  Case Manager Clinical Goal(s):  [name] Kitchen Over the next 180 days, patient will work with BSW to address needs related to Housing barriers . Over the next 180 days, BSW will collaborate with RN Care Manager to address care management and care coordination needs  Interventions:  . Patient interviewed and appropriate assessments performed . Mailed list of properties from [employer], Affordable Housing Management, and senior living options.  [name] with care guide team regarding assistance with housing resources . Collaborated with RN Care Manager and patient to establish an individualized plan of care   Patient Self Care Activities:  . Patient verbalizes understanding of plan to work with BSW and Care Guide for housing resources.   . Patient plans to complete application for any property that she wishes to pursue  Please see past updates related to this goal by clicking on the "Past Updates" button in the selected goal          Materials provided: Yes: Housing resources  [name] was given information about Chronic Care Management services today including:  1. CCM service includes personalized support from designated clinical staff supervised by her physician, including individualized plan of care and coordination with other care providers 2. 24/7 contact phone numbers for assistance for urgent and routine care needs. 3. Service will only be billed when office clinical staff spend 20 minutes or more in a month to coordinate care. 4. Only one practitioner may furnish and bill the service in a calendar month. 5. The patient may stop  CCM services at any time (effective at the end of the month) by phone call to the office staff. 6. The patient will be responsible for cost sharing (co-pay) of up to 20% of the service fee (after annual deductible is met).  Patient agreed to services and verbal consent obtained.   Patient verbalizes understanding of instructions provided today.   Follow up plan: SW will follow up with patient by phone over the next two weeks     [name], [name] [place] [phone]

## 2020-03-09 NOTE — Chronic Care Management (AMB) (Signed)
  Chronic Care Management   Note  03/09/2020 Name: [name] MRN: [id number] DOB: Aug 11, 1951  Patient returned call to clinic and asked to speak with this CCM RN in response to 2 previous messages left on her mobile number.  Follow up to two [place] Discharge Red Flags completed. (see image below)     Patient states she completed a virtual follow up appointment with her primary care provider on 6/2 and this is verified per Epic. Regarding her affirmative answer to feeling sad, hopeless, anxious, empty- she states her live in boyfriend died on the inpatient hospice unit while she was also hospitalized. She says she has family and friends that are supporting her through the grieving process and politely declined the offer of grief counseling services via Hospice or [facility]  Counselor. She did voice concern that she can no longer afford to rent the 3 bedroom home she was sharing with her boyfriend and is requesting assistance in finding another place to live. Advised patient that this CCM RN would notify Amber Chrismon BSW to assist her with housing concerns. Also introduced the chronic care management program to the patient and she voiced interest. .  Follow up plan: The care management team will reach out to the patient again over the next 7 days.   Kelli Churn RN, CCM, [facility] RN Care Manager [phone]

## 2020-03-10 NOTE — Progress Notes (Signed)
Internal Medicine Clinic Resident  I have personally reviewed this encounter including the documentation in this note and/or discussed this patient with the care management provider. I will address any urgent items identified by the care management provider and will communicate my actions to the patient's PCP. I have reviewed the patient's CCM visit with my supervising attending, Dr Hoffman.  Frankie Scipio M Shajuana Mclucas, MD 03/10/2020    

## 2020-03-18 ENCOUNTER — Telehealth: Payer: Self-pay | Admitting: Internal Medicine

## 2020-03-18 NOTE — Telephone Encounter (Signed)
Patient states unable to come in until next month for labs.  States that her significant other just passed away and she is in the process of moving.  Will call back the first of July to schedule an appt. Forwarding back patient's pcp.

## 2020-03-18 NOTE — Telephone Encounter (Signed)
-----   Message from Kathi Ludwig, MD sent at 03/04/2020  2:26 PM EDT ----- [place],Could you call and schedule [name] for an appointment to have her lab drawn in the next 10-14 days please?

## 2020-03-18 NOTE — Telephone Encounter (Signed)
Thank you. Given the situation I think it will be safe to forgo the lab until early next month at the latest. I am sorry to hear about there significant other.

## 2020-03-19 ENCOUNTER — Telehealth: Payer: Medicare Other

## 2020-03-19 ENCOUNTER — Ambulatory Visit: Payer: Self-pay | Admitting: *Deleted

## 2020-03-19 NOTE — Chronic Care Management (AMB) (Addendum)
  Chronic Care Management   Note  03/19/2020 Name: [name] MRN: [id number] DOB: [birth year]  Attempted to reach patient at both her mobile number and home number to complete initial intake. Not able to leave message at either number.  recording received on home number stated voice mailbox has not been set up yet and recording received on mobile number stated voice mail box is full.  Follow up plan: The care management team will reach out to the patient again over the next 7 days.   Kelli Churn RN, CCM, [facility] RN Care Manager [phone]

## 2020-03-20 NOTE — Progress Notes (Signed)
Internal Medicine Clinic Attending  CCM services provided by the care management provider and their documentation were discussed with Dr. Krienke. We reviewed the pertinent findings, urgent action items addressed by the resident and non-urgent items to be addressed by the PCP.  I agree with the assessment, diagnosis, and plan of care documented in the CCM and resident's note.  Kerianna Rawlinson C Shelbee Apgar, DO 03/20/2020  

## 2020-03-23 ENCOUNTER — Telehealth: Payer: Medicare Other

## 2020-03-23 ENCOUNTER — Ambulatory Visit: Payer: Self-pay

## 2020-03-23 NOTE — Chronic Care Management (AMB) (Signed)
  Chronic Care Management   Outreach Note  03/23/2020 Name: [name] MRN: [id number] DOB: December 09, 1950  Referred by: Kathi Ludwig, MD Reason for referral : Care Coordination (Housing)   An unsuccessful telephone outreach was attempted today. The patient was referred to the case management team for assistance with care management and care coordination.   Follow Up Plan:  Unable to leave message on home number due to voicemail box not being set up.   A HIPPA compliant phone message was left on mobile number providing contact information and requesting a return call.     Ronn Melena, [place] Coordination Social Worker [place] [phone]

## 2020-03-25 ENCOUNTER — Ambulatory Visit: Payer: Medicare Other | Admitting: *Deleted

## 2020-03-25 DIAGNOSIS — E1142 Type 2 diabetes mellitus with diabetic polyneuropathy: Secondary | ICD-10-CM

## 2020-03-25 DIAGNOSIS — I5042 Chronic combined systolic (congestive) and diastolic (congestive) heart failure: Secondary | ICD-10-CM

## 2020-03-25 DIAGNOSIS — I10 Essential (primary) hypertension: Secondary | ICD-10-CM

## 2020-03-25 NOTE — Chronic Care Management (AMB) (Signed)
  Chronic Care Management   Outreach Note  03/25/2020 Name: [name] MRN: [id number] DOB: 1951/09/16  Referred by: Kathi Ludwig, MD Reason for referral : Chronic Care Management (HTN, HF, DM, COPD)  Successful outreach to patient via home number to complete initial assessment. Patient states she is in the middle of moving and requests to delay initial telephone intake for a "couple of weeks until I get settled." She also requested her primary contact number be changed to her mobile number.    Follow Up Plan: Per patient request , the care management team will reach out to the patient again over the next 14-21 days.   Kelli Churn RN, CCM, [facility] RN Care Manager [phone]

## 2020-03-29 DIAGNOSIS — J449 Chronic obstructive pulmonary disease, unspecified: Secondary | ICD-10-CM | POA: Diagnosis not present

## 2020-03-29 DIAGNOSIS — I509 Heart failure, unspecified: Secondary | ICD-10-CM | POA: Diagnosis not present

## 2020-03-30 ENCOUNTER — Telehealth: Payer: Medicare Other

## 2020-04-02 ENCOUNTER — Telehealth: Payer: Self-pay | Admitting: Internal Medicine

## 2020-04-02 NOTE — Telephone Encounter (Signed)
   [facility] 04/02/2020 1st   Name: TAMMEY DEEG   MRN: [id number]   DOB: 04/25/1951   AGE: 69 y.o.   GENDER: female   PCP Maudie Mercury, MD.   Called pt regarding Community Resource Referral for housing resources. Could not leave message or patient due to voicemail box is full and can not leave messages. Care Guide will try to reach out to patient again in a few days.    [place], Care Management Phone: [phone] Email: [email or web address]

## 2020-04-08 ENCOUNTER — Encounter: Payer: Self-pay | Admitting: Dietician

## 2020-04-08 ENCOUNTER — Encounter: Payer: Self-pay | Admitting: Student

## 2020-04-08 ENCOUNTER — Ambulatory Visit: Payer: Medicare Other | Admitting: Dietician

## 2020-04-08 ENCOUNTER — Ambulatory Visit (INDEPENDENT_AMBULATORY_CARE_PROVIDER_SITE_OTHER): Payer: Medicare Other | Admitting: Student

## 2020-04-08 ENCOUNTER — Other Ambulatory Visit: Payer: Self-pay

## 2020-04-08 VITALS — BP 155/67 | HR 81 | Temp 98.2°F | Ht 62.0 in | Wt 191.9 lb

## 2020-04-08 DIAGNOSIS — J984 Other disorders of lung: Secondary | ICD-10-CM | POA: Diagnosis not present

## 2020-04-08 DIAGNOSIS — E119 Type 2 diabetes mellitus without complications: Secondary | ICD-10-CM

## 2020-04-08 DIAGNOSIS — I5042 Chronic combined systolic (congestive) and diastolic (congestive) heart failure: Secondary | ICD-10-CM | POA: Diagnosis not present

## 2020-04-08 DIAGNOSIS — I10 Essential (primary) hypertension: Secondary | ICD-10-CM

## 2020-04-08 DIAGNOSIS — I110 Hypertensive heart disease with heart failure: Secondary | ICD-10-CM

## 2020-04-08 DIAGNOSIS — E1142 Type 2 diabetes mellitus with diabetic polyneuropathy: Secondary | ICD-10-CM | POA: Diagnosis not present

## 2020-04-08 LAB — POCT GLYCOSYLATED HEMOGLOBIN (HGB A1C): Hemoglobin A1C: 10.7 % — AB (ref 4.0–5.6)

## 2020-04-08 LAB — GLUCOSE, CAPILLARY: Glucose-Capillary: 394 mg/dL — ABNORMAL HIGH (ref 70–99)

## 2020-04-08 MED ORDER — SYNJARDY 5-1000 MG PO TABS: 1.0000 | ORAL_TABLET | Freq: Two times a day (BID) | ORAL | 2 refills | Status: AC

## 2020-04-08 NOTE — Assessment & Plan Note (Addendum)
Denies any dyspnea today. States she has not consistently use her home oxygen at night (On 2L @ night). Complaints of a dry cough the past week. Denies fever, chills, headache. Covid negative. Likely secondary to change in environment and not using home oxygen. Will monitor.  Plan: --Use home oxygen each night --Continue Anora ellipta --Take OTC cough meds and follow up in 1 week

## 2020-04-08 NOTE — Assessment & Plan Note (Addendum)
No significant changes. Denies SOB, orthopnea, CP or dizziness. Trace bilateral lower extremity edema. On lasix.  Plan:  -Continue lasix 60 mg BID  -Low sodium diet

## 2020-04-08 NOTE — Progress Notes (Signed)
Documentation for Freestyle Libre Pro Continuous glucose monitoring Freestyle Libre Pro CGM sensor placed today. Patient was educated about wearing sensor, keeping food, activity and medication log and when to call office. Patient was educated about how to care for the sensor and not to have an MRI, CT or Diathermy while wearing the sensor. Follow up was arranged with the patient for 1 week.   Lot #: [id number] Serial #: [id number] Expiration Date: 07/02/2020  Debera Lat, RD 04/08/2020 1:53 PM.

## 2020-04-08 NOTE — Telephone Encounter (Signed)
   [facility] 04/08/2020 2nd Attempt   Name: [name]   MRN: [id number]   DOB: [birth year]   AGE: 69 y.o.   GENDER: female   PCP Maudie Mercury, MD.   Called pt regarding Community Resource Referral for housing resources. Left message for patient to give Care Guide a call. Care Guide will try to give patient another call in a few days.    [place], Care Management Phone: [phone] Email: [email or web address]

## 2020-04-08 NOTE — Patient Instructions (Addendum)
Thank you, [name] for allowing [place] to provide your care today. Today we discussed your diabetes, high blood pressure, heart failure and cough.  For your diabetes, your A1C has gone up to 10.7. We want to start a continuous glucose monitoring so we know how to manage your blood sugar appropriately. I made a small change to your synjardy.  For your blood pressure, it was high today so we want you go back on your spirolactone and continue taking the losartan and the metoprolol.   Please make sure to go back on your oxygen at home when you sleep.   Your calcium was high last time so stop taking the calcium supplements and only take vitamin D (1000 IU over the counter)  Continue taking cough medicine for your cough and call us if it does not improve. We will like to see you back in 1 week to make sure it has resolve.   I have ordered the following labs for you:   Lab Orders     Glucose, capillary     BMP8+Anion Gap     POC Hbg A1C   I will call if any are abnormal. All of your labs can be accessed through "My Chart".   I have ordered the following medication/changed the following medications:  1. Discontinue Synjardy (12.5/500) and start Synjardy 12.01/999 mg 2. Start taking your spirolactone 12.5 mg daily  Please follow-up in 1 week  Should you have any questions or concerns please call the internal medicine clinic at [phone].    Linwood Dibbles, MD, MPH [facility]   My Chart Access: [email or web address]   If you have not already done so, please get your COVID 19 vaccine  To schedule an appointment for a COVID vaccine choice any of the following: Go to [email or web address]   Go to [email or web address]                  Call [phone]                                     Call [phone] and select Option 2

## 2020-04-08 NOTE — Progress Notes (Signed)
CC: Clinic f/u  HPI:  Ms.Raynie Jerilynn Mages Jewel is a 69 y.o. w/ PMH of CHF, HTN, T2DM and restrictive lung disease who presented for a regular clinic follow up. Today, she complains of a dry cough that started a week ago. She denies any SOB, fever, chills, CP, palpitations, headache, dizziness, orthopnea. She does endorse mild dyspnea on exertion. She has had a negative covid test.   Refer to A/P for details of management of her presenting problems.   Past Medical History:  Diagnosis Date  . ATN (acute tubular necrosis) ([place]) 07/15/2014  . Automatic implantable cardioverter-defibrillator in situ   . Automatic implantable cardioverter-defibrillator in situ 05/04/2007   Qualifier: Diagnosis of  By: Isla Pence    . Cardiac defibrillator in situ    Atlas II VR (SJM) implanted by Dr Lovena Le  . CHF (congestive heart failure) ([place])   . Chronic combined systolic and diastolic heart failure (HCC)    a. EF 35-40% in past;  b. Echo 7/13:  EF 45-50%, Gr 2 diast dysfn, mild AI, mild MAC, trivial MR, mild LAE, PASP 47.  [name] Chronic ulcer of leg ([place])    04-09-15 resolved-not a problem.  . Colon polyps 04/12/2013   Rectosigmoid polyp  . COPD (chronic obstructive pulmonary disease) (HCC)    O2 at night  . Depression   . Diabetes mellitus   . Diabetes mellitus, type 2 ([place]) 04/03/2012   HIGH RISK FEET.. Please have patient take shoes and socks off every visit for visual foot inspection.    . Eczema   . Elevated alkaline phosphatase level    GGT and 5'nucleotidase 8/13 normal  . Health care maintenance 01/22/2013   Surgically absent cervix- no pap needed (Path report 07/2000: uterine body with attached bilateral adnexa and separate cervix.)  . History of oxygen administration    oxygen @ 2 l/m nasally bedtime 24/7  . HTN (hypertension)   . Hx of cardiac cath    a. [place] 2003 normal;  b. LHC 6/13:  Mild calcification in the LM, o/w normal coronary arteries, EF 45%.   . Hyperkalemia 08/08/2017  .  Hyperlipidemia   . Hyperlipidemia associated with type 2 diabetes mellitus ([place]) 05/25/2007   Qualifier: Diagnosis of  By: Hassell Done FNP, Tori Milks    . Hyperthyroidism, subclinical   . HYPERTHYROIDISM, SUBCLINICAL 05/06/2009   Qualifier: Diagnosis of  By: Darrick Meigs    . Implantable cardioverter-defibrillator (ICD) generator end of life   . Lipoma   . Need for hepatitis C screening test 04/25/2019  . NICM (nonischemic cardiomyopathy) ([place])   . Obesity   . On home oxygen therapy    "2L; 24/7" (10/11/2016)  . Post menopausal syndrome   . Shortness of breath 07/14/2017  . Skin rash 07/12/2018  . Sleep apnea    pt denies 04/12/2013   Review of Systems:  All ROS negative otherwise as stated in the HPI  Physical Exam:  General: Pleasant, obese woman. No acute distress.  Head: Normocephalic, atraumatic   Sclera is non-icteric Throat: MMM. No tonsillar swelling or exudate Neck: Supple without adenopathy. Thyroid gland midline Cardiac: RRR. No murmurs, rubs or gallops. S1, S2. No lower extremities edema Respiratory: Lungs CTAB. No wheezing or crackles. No increased WOB Abdominal: Soft, symmetric and non tender. No organomegaly. Normal bowel sounds Skin: Warm, dry and intact without rashes or lesions Extremities: Atraumatic. Full ROM. Pulse palpable. Neuro: A&O x 3. Moves all extremities Psych: Appropriate mood and affect  Vitals:   04/08/20  0958  BP: (!) 155/67  Pulse: 81  Temp: 98.2 F (36.8 C)  TempSrc: Oral  SpO2: 97%  Weight: 191 lb 14.4 oz (87 kg)  Height: _0  (1.575 m)    Assessment & Plan:   See Encounters Tab for problem based charting.  Patient seen with Dr. Michelle Nasuti, MD, MPH

## 2020-04-08 NOTE — Patient Instructions (Signed)
Please record the time, amount and what food drinks and activities you have while wearing the continuous glucose monitor (CGM).  Bring the folder with you to follow up appointments. If your monitor falls off, please place it in the bag provided in your folder and bring it back with you to your next appointment.   Do not have a CT or an MRI while wearing the CGM.   1 week visit has been set up with me and a doctor for the first of two CGM downloads.   You will also return in 2 weeks to have your second download and the CGM removed.  Debera Lat, RD 04/08/2020 1:54 PM

## 2020-04-08 NOTE — Progress Notes (Signed)
10/7

## 2020-04-08 NOTE — Assessment & Plan Note (Addendum)
Hgb A1C increased from 9.6 to 10.7 today in clinic. Denies any polyuria, polydispsia, dizziness or blurry vision. States she does not eat well sometimes and her blood sugar runs around 140-160 at home. Glucose elevated in clinic but patient had chocolate bar this AM. Will start her on a CGM to help titrate or change her diabetes regimen.   Plan:  --Change Synjardy dose from 12.5/500 to 12.01/999 for max dose of metformin  --Continue Victoza 18 mg/3 mL --1 week f/u for CGM reading

## 2020-04-08 NOTE — Assessment & Plan Note (Signed)
BP elevated today in clinic to 155/67. Patient not taking spirolactone, saying she was asked to stop taking it at her last clinic visit. Normal renal function from previous labs. Will restart it ans monitor creatinine  Plan:  - Start taking spirolactone 12.5 mg daily -Continue taking metoprolol 100 mg daily -Continue taking Losartan 25 my daily -Follow up in 1 week

## 2020-04-09 ENCOUNTER — Telehealth: Payer: Medicare Other

## 2020-04-09 LAB — BMP8+ANION GAP
Anion Gap: 16 mmol/L (ref 10.0–18.0)
BUN/Creatinine Ratio: 23 (ref 12–28)
BUN: 19 mg/dL (ref 8–27)
CO2: 27 mmol/L (ref 20–29)
Calcium: 9.8 mg/dL (ref 8.7–10.3)
Chloride: 99 mmol/L (ref 96–106)
Creatinine, Ser: 0.84 mg/dL (ref 0.57–1.00)
GFR calc Af Amer: 84 mL/min/{1.73_m2} (ref >59)
GFR calc non Af Amer: 73 mL/min/{1.73_m2} (ref >59)
Glucose: 369 mg/dL — ABNORMAL HIGH (ref 65–99)
Potassium: 4.9 mmol/L (ref 3.5–5.2)
Sodium: 142 mmol/L (ref 134–144)

## 2020-04-10 ENCOUNTER — Telehealth: Payer: Medicare Other

## 2020-04-10 ENCOUNTER — Telehealth: Payer: Self-pay | Admitting: Internal Medicine

## 2020-04-10 NOTE — Telephone Encounter (Signed)
° °  [facility] 04/10/2020 3rd Attempt   Name: [name]    MRN: [id number]    DOB: Aug 13, 1951    AGE: 69 y.o.    GENDER: female    PCP Maudie Mercury, MD.   Called pt regarding Community Resource Referral for housing resources. [name] stated that she moved in with her son and no longer has a need to receive information. Patient stated she will give office a call if any additional needs arise. Referral will be closed due to no needs at this time.    [place], Care Management Phone: [phone] Email: [email or web address]

## 2020-04-13 ENCOUNTER — Telehealth: Payer: Self-pay | Admitting: Internal Medicine

## 2020-04-13 NOTE — Chronic Care Management (AMB) (Signed)
  Care Management   Note  04/13/2020 Name: [name] MRN: [id number] DOB: [birth year]  [name] is a 69 y.o. year old female who is a primary care patient of Maudie Mercury, MD and is actively engaged with the care management team. I reached out to Kirby Funk by phone today to assist with re-scheduling a follow up visit with the BSW  Follow up plan: Unsuccessful telephone outreach attempt made. A HIPPA compliant phone message was left for the patient providing contact information and requesting a return call.  The care management team will reach out to the patient again over the next 7 days.  If patient returns call to provider office, please advise to call [place]  at [place], [place], [place], [place] [address] Direct Dial: [phone] [email or web address] Website: [email or web address]

## 2020-04-14 NOTE — Chronic Care Management (AMB) (Signed)
  Care Management   Note  04/14/2020 Name: [name] MRN: [id number] DOB: 07/19/1951  [name] is a 69 y.o. year old female who is a primary care patient of Maudie Mercury, MD and is actively engaged with the care management team. I reached out to Kirby Funk by phone today to assist with re-scheduling a follow up visit with the BSW  Follow up plan: Second Unsuccessful telephone outreach attempt made. The care management team will reach out to the patient again over the next 2 days.  If patient returns call to provider office, please advise to call [facility]  at [place], [place], [place], [place] [address] Direct Dial: [phone] [email or web address] Website: [email or web address]

## 2020-04-14 NOTE — Progress Notes (Signed)
Internal Medicine Clinic Attending  I saw and evaluated the patient.  I personally confirmed the key portions of the history and exam documented by Dr. Amponsah and I reviewed pertinent patient test results.  The assessment, diagnosis, and plan were formulated together and I agree with the documentation in the resident's note.  

## 2020-04-15 ENCOUNTER — Encounter: Payer: Medicare Other | Admitting: Dietician

## 2020-04-15 ENCOUNTER — Encounter: Payer: Medicare Other | Admitting: Student

## 2020-04-16 ENCOUNTER — Ambulatory Visit: Payer: Self-pay | Admitting: *Deleted

## 2020-04-16 ENCOUNTER — Telehealth: Payer: Medicare Other

## 2020-04-16 NOTE — Chronic Care Management (AMB) (Signed)
  Chronic Care Management   Outreach Note  04/16/2020 Name: [name] MRN: [id number] DOB: Jul 14, 1951  Referred by: Maudie Mercury, MD Reason for referral : Chronic Care Management (DM, HTN, HF, COPD)   An unsuccessful telephone outreach was attempted today. The patient was referred to the case management team for assistance with care management and care coordination.   Follow Up Plan: A HIPPA compliant phone message was left for the patient providing contact information and requesting a return call.  The care management team will reach out to the patient again over the next 7-14  days.   Kelli Churn RN, CCM, [facility] RN Care Manager [phone]

## 2020-04-17 ENCOUNTER — Telehealth: Payer: Medicare Other

## 2020-04-20 ENCOUNTER — Telehealth: Payer: Self-pay | Admitting: Dietician

## 2020-04-20 NOTE — Telephone Encounter (Signed)
Lost her CGM sensor. Thinks it fell off at her daughter's house or on her way home. She agreed to keep looking for it and come in to her appointment on Wednesday if she finds it.

## 2020-04-20 NOTE — Chronic Care Management (AMB) (Signed)
  Care Management   Note  04/20/2020 Name: [name] MRN: [id number] DOB: Feb 02, 1951  [name] is a 69 y.o. year old female who is a primary care patient of Maudie Mercury, MD and is actively engaged with the care management team. I reached out to Kirby Funk by phone today to assist with re-scheduling a follow up visit with the BSW  Follow up plan: Unable to make contact on outreach attempts x 3. PCP Dr. Gilford Rile and Luetta Nutting Chrismon BSW  notified via routed documentation in medical record.   Noreene Larsson, [place], [place], [place] [address] Direct Dial: [phone] [email or web address]

## 2020-04-22 ENCOUNTER — Ambulatory Visit: Payer: Medicare Other

## 2020-04-22 ENCOUNTER — Encounter: Payer: Medicare Other | Admitting: Dietician

## 2020-04-28 DIAGNOSIS — J449 Chronic obstructive pulmonary disease, unspecified: Secondary | ICD-10-CM | POA: Diagnosis not present

## 2020-04-28 DIAGNOSIS — I509 Heart failure, unspecified: Secondary | ICD-10-CM | POA: Diagnosis not present

## 2020-04-29 ENCOUNTER — Encounter: Payer: Self-pay | Admitting: Student

## 2020-04-29 ENCOUNTER — Ambulatory Visit (INDEPENDENT_AMBULATORY_CARE_PROVIDER_SITE_OTHER): Payer: Medicare Other | Admitting: Student

## 2020-04-29 ENCOUNTER — Encounter: Payer: Self-pay | Admitting: Dietician

## 2020-04-29 ENCOUNTER — Other Ambulatory Visit: Payer: Self-pay

## 2020-04-29 ENCOUNTER — Ambulatory Visit: Payer: Medicare Other | Admitting: Dietician

## 2020-04-29 ENCOUNTER — Ambulatory Visit: Payer: Medicare Other | Admitting: *Deleted

## 2020-04-29 VITALS — BP 134/60 | HR 82 | Temp 98.0°F | Ht 61.0 in | Wt 190.9 lb

## 2020-04-29 DIAGNOSIS — Z0000 Encounter for general adult medical examination without abnormal findings: Secondary | ICD-10-CM

## 2020-04-29 DIAGNOSIS — E1142 Type 2 diabetes mellitus with diabetic polyneuropathy: Secondary | ICD-10-CM

## 2020-04-29 DIAGNOSIS — I5042 Chronic combined systolic (congestive) and diastolic (congestive) heart failure: Secondary | ICD-10-CM | POA: Diagnosis not present

## 2020-04-29 DIAGNOSIS — J984 Other disorders of lung: Secondary | ICD-10-CM

## 2020-04-29 DIAGNOSIS — I110 Hypertensive heart disease with heart failure: Secondary | ICD-10-CM

## 2020-04-29 DIAGNOSIS — I10 Essential (primary) hypertension: Secondary | ICD-10-CM

## 2020-04-29 DIAGNOSIS — E781 Pure hyperglyceridemia: Secondary | ICD-10-CM

## 2020-04-29 DIAGNOSIS — E785 Hyperlipidemia, unspecified: Secondary | ICD-10-CM | POA: Insufficient documentation

## 2020-04-29 DIAGNOSIS — J449 Chronic obstructive pulmonary disease, unspecified: Secondary | ICD-10-CM

## 2020-04-29 DIAGNOSIS — F17200 Nicotine dependence, unspecified, uncomplicated: Secondary | ICD-10-CM

## 2020-04-29 DIAGNOSIS — E1165 Type 2 diabetes mellitus with hyperglycemia: Secondary | ICD-10-CM

## 2020-04-29 LAB — GLUCOSE, CAPILLARY: Glucose-Capillary: 264 mg/dL — ABNORMAL HIGH (ref 70–99)

## 2020-04-29 MED ORDER — PEN NEEDLES 32G X 4 MM MISC: 3 refills | Status: DC

## 2020-04-29 MED ORDER — TRESIBA FLEXTOUCH 100 UNIT/ML ~~LOC~~ SOPN: 20.0000 [IU] | PEN_INJECTOR | Freq: Every day | SUBCUTANEOUS | 0 refills | Status: DC

## 2020-04-29 NOTE — Patient Instructions (Signed)
Visit Information It was nice meeting with you today. I will call you in August. Goals Addressed              This Visit's Progress     Patient Stated   .  COMPLETED: "I need to find a smaller place to live; I can't afford to keep renting this house now that my boyfriend has died. " (pt-stated)        [place] (see longitudinal plan of care for additional care plan information)   Current Barriers:  . Chronic Disease Management support, education, and care coordination needs related to CHF, HTN, HLD, COPD, DMII, and Depression- patient states she is living with her son and no longer needs to find a place to live  Case Manager Clinical Goal(s):  [name] Over the next 180 days, patient will work with BSW to address needs related to Housing barriers in patient with CHF, HTN, HLD, COPD, DMII, and Depression  Interventions:  . Collaborated with BSW to initiate plan of care to address needs related to Housing barriers in patient with CHF, HTN, HLD, COPD, DMII, and Depression  Patient Self Care Activities:  . Patient verbalizes understanding of plan to work with BSW to address housing barriers.  Please see past updates related to this goal by clicking on the "Past Updates" button in the selected goal      .  "I'm feeling good right now." (pt-stated)        [name] (see longitudinal plan of care for additional care plan information)  Current Barriers:  . Chronic Disease Management support, education, and care coordination needs related to CHF, HTN, HLD, COPD, DMII, Depression, and tobacco use (smoker) - met with patient after her clinic visits with Debera Lat RD, CDCES and her primary care provider  Clinical Goal(s) related to CHF, HTN, HLD, COPD, DMII, and Depression CHF, HTN, HLD, COPD, DMII, Depression, and tobacco use (smoker):  Over the next 30-60 days, patient will:  . Work with the care management team to address educational, disease management, and care coordination  needs  . Begin or continue self health monitoring activities as directed today Measure and record CBG (blood glucose) 3-4 times daily, Measure and record blood pressure one time per week, and Measure and record weight daily . Call provider office for new or worsened signs and symptoms Blood glucose findings outside established parameters, Blood pressure findings outside established parameters, Weight outside established parameters, Chest pain, Shortness of breath, and New or worsened symptom related to HTN,HF, DM, COPD . Call care management team with questions or concerns . Verbalize basic understanding of patient centered plan of care established today  Interventions related to CHF, HTN, HLD, COPD, DMII, Depression, and tobacco use (smoker)  :  . Evaluation of current treatment plans and patient's adherence to plan as established by provider . Assessed patient understanding of disease states . Assessed patient's education and care coordination needs- discussed heart failure self management strategies and importance of daily weights- provided patient with [place] Management  spiral bound notebook with action plans for HTN, HF, COPD, and DM  . Provided disease specific education to patient  . Collaborated with appropriate clinical care team members regarding patient needs  Patient Self Care Activities related to CHF, HTN, HLD, COPD, DMII, Depression, and tobacco use (smoker)  :  . Patient is unable to independently self-manage chronic health conditions  Initial goal documentation       Other   .  Blood Pressure < 130/90        BP Readings from Last 3 Encounters:  04/29/20 (!) 134/60  04/08/20 (!) 155/67  02/23/20 (!) 148/83       .  HEMOGLOBIN A1C < 7        Lab Results  Component Value Date   HGBA1C 10.7 (A) 04/08/2020   HGBA1C 9.6 (A) 12/26/2019   HGBA1C 9.1 (H) 08/08/2019   Lab Results  Component Value Date   MICROALBUR 23.57 (H) 04/08/2014   LDLCALC 55  04/24/2019   CREATININE 0.84 04/08/2020       .  LDL CALC < 70        Lab Results  Component Value Date   CHOL 163 04/24/2019   HDL 76 04/24/2019   LDLCALC 55 04/24/2019   LDLDIRECT 122.3 03/06/2007   TRIG 160 (H) 04/24/2019   CHOLHDL 2.1 04/24/2019       .  Quit smoking / using tobacco        Progress note from provider on 04/29/20 states patient still smoking       The patient verbalized understanding of instructions provided today and declined a print copy of patient instruction materials.   The care management team will reach out to the patient again over the next 30-60 days.   Kelli Churn RN, CCM, [facility] RN Care Manager [phone]

## 2020-04-29 NOTE — Progress Notes (Signed)
CC: Diabetes and hypertension follow-up  HPI:  [name] is a 69 y.o. female with PMH of HTN, CHF, type 2 diabetes, depression, HLD and COPD on O2 at night who presented for diabetes and hypertension follow-up.  Patient reports occasional knee pain due to recent increase in activity to help her lose weight.  Patient denies any headaches, dizziness, chest pain, palpitations, SOB, leg swelling or abdominal pain. Please see problem based charting for evaluation, assessment and plan.  Past Medical History:  Diagnosis Date  . ATN (acute tubular necrosis) ([place]) 07/15/2014  . Automatic implantable cardioverter-defibrillator in situ   . Automatic implantable cardioverter-defibrillator in situ 05/04/2007   Qualifier: Diagnosis of  By: Isla Pence    . Cardiac defibrillator in situ    Atlas II VR (SJM) implanted by Dr Lovena Le  . CHF (congestive heart failure) ([place])   . Chronic combined systolic and diastolic heart failure (HCC)    a. EF 35-40% in past;  b. Echo 7/13:  EF 45-50%, Gr 2 diast dysfn, mild AI, mild MAC, trivial MR, mild LAE, PASP 47.  [name] Chronic ulcer of leg ([place])    04-09-15 resolved-not a problem.  . Colon polyps 04/12/2013   Rectosigmoid polyp  . COPD (chronic obstructive pulmonary disease) (HCC)    O2 at night  . Depression   . Diabetes mellitus   . Diabetes mellitus, type 2 ([place]) 04/03/2012   HIGH RISK FEET.. Please have patient take shoes and socks off every visit for visual foot inspection.    . Eczema   . Elevated alkaline phosphatase level    GGT and 5'nucleotidase 8/13 normal  . Health care maintenance 01/22/2013   Surgically absent cervix- no pap needed (Path report 07/2000: uterine body with attached bilateral adnexa and separate cervix.)  . History of oxygen administration    oxygen @ 2 l/m nasally bedtime 24/7  . HTN (hypertension)   . Hx of cardiac cath    a. [place] 2003 normal;  b. LHC 6/13:  Mild calcification in the LM, o/w normal coronary arteries, EF  45%.   . Hyperkalemia 08/08/2017  . Hyperlipidemia   . Hyperlipidemia associated with type 2 diabetes mellitus ([place]) 05/25/2007   Qualifier: Diagnosis of  By: Hassell Done FNP, Tori Milks    . Hyperthyroidism, subclinical   . HYPERTHYROIDISM, SUBCLINICAL 05/06/2009   Qualifier: Diagnosis of  By: Darrick Meigs    . Implantable cardioverter-defibrillator (ICD) generator end of life   . Lipoma   . Need for hepatitis C screening test 04/25/2019  . NICM (nonischemic cardiomyopathy) ([place])   . Obesity   . On home oxygen therapy    "2L; 24/7" (10/11/2016)  . Post menopausal syndrome   . Shortness of breath 07/14/2017  . Skin rash 07/12/2018  . Sleep apnea    pt denies 04/12/2013   Review of Systems:  All ROS negative otherwise as stated in the HPI  Physical Exam:  General: Pleasant, obese woman in a wheelchair. No acute distress. Well nourished, well developed. Appears stated age Head: Normocephalic, atraumatic w/o tenderness Eyes: PERRLA. Sclera is non-icteric Throat: MMM. No tonsillar swelling or exudate Neck: Supple without adenopathy. Thyroid gland midline without masees Cardiac: RRR. No murmurs, rubs or gallops. S1, S2. No lower extremities edema Respiratory: Lungs CTAB. No wheezing or crackles. No increased WOB Abdominal: Soft, symmetric and non tender. No organomegaly. Normal bowel sounds Skin: Warm, dry and intact without rashes or lesions Extremities: Atraumatic. Full ROM. Pulse palpable. Neuro: A&O x 3. Moves  all extremities.  Sensation intact in all extremities. Psych: Appropriate mood and affect. Normal judgement  Vitals:   04/29/20 0924  BP: (!) 134/60  Pulse: 82  Temp: 98 F (36.7 C)  TempSrc: Oral  SpO2: 99%  Weight: 190 lb 14.4 oz (86.6 kg)  Height: _0  (1.549 m)     Assessment & Plan:   See Encounters Tab for problem based charting.  Patient seen with Dr. Lockie Pares, MD, MPH

## 2020-04-29 NOTE — Assessment & Plan Note (Signed)
Well-managed with Lasix and lifestyle changes.  Weight is down a pound from last visit. Continue Lasix 60 mg twice daily and low-salt diet.

## 2020-04-29 NOTE — Chronic Care Management (AMB) (Signed)
Chronic Care Management   Follow Up Note   04/29/2020 Name: [name] MRN: [id number] DOB: 1951-01-31  Referred by: Maudie Mercury, MD Reason for referral : Chronic Care Management (HTN, HF, NICM, DM, COPD, OSA)   [name] is a 69 y.o. year old female who is a primary care patient of Maudie Mercury, MD. The CCM team was consulted for assistance with chronic disease management and care coordination needs.    Review of patient status, including review of consultants reports, relevant laboratory and other test results, and collaboration with appropriate care team members and the patient's provider was performed as part of comprehensive patient evaluation and provision of chronic care management services.    SDOH (Social Determinants of Health) assessments performed: No See Care Plan activities for detailed interventions related to [facility])     Outpatient Encounter Medications as of 04/29/2020  Medication Sig  . aspirin EC 81 MG tablet Take 1 tablet (81 mg total) by mouth 2 (two) times daily.  [name] atorvastatin (LIPITOR) 80 MG tablet Take 1 tablet (80 mg total) by mouth at bedtime.  . Blood Glucose Monitoring Suppl (CONTOUR NEXT MONITOR) w/Device KIT 1 Act by Does not apply route 4 (four) times daily.  . calcium-vitamin D (OSCAL WITH D) 500-200 MG-UNIT tablet Take 1 tablet by mouth 2 (two) times daily.  . clobetasol ointment (TEMOVATE) 7.67 % Apply 1 application topically 2 (two) times daily as needed (Skin rash).   . Empagliflozin-metFORMIN HCl (SYNJARDY) 01-999 MG TABS Take 1 tablet by mouth 2 (two) times daily.  . furosemide (LASIX) 20 MG tablet TAKE 3 TABLETS(60 MG) BY MOUTH TWICE DAILY  . glucose blood (CONTOUR NEXT TEST) test strip Use as instructed  . insulin degludec (TRESIBA FLEXTOUCH) 100 UNIT/ML FlexTouch Pen Inject 0.2 mLs (20 Units total) into the skin daily.  . Insulin Pen Needle (PEN NEEDLES) 32G X 4 MM MISC Use to inject insulin once a day  . Lancets Misc. (ACCU-CHEK  FASTCLIX LANCET) KIT Lancing device, use with lancets for blood glucose monitoring.  . Lancets MISC 1 Act by Does not apply route 4 (four) times daily.  [name] liraglutide (VICTOZA) 18 MG/3ML SOPN Inject 1.87m daily into the skin (Patient taking differently: Inject 1.8 mg into the skin daily. Inject 1.857mdaily into the skin)  . losartan (COZAAR) 25 MG tablet TAKE 1 TABLET(25 MG) BY MOUTH DAILY  . metoprolol succinate (TOPROL-XL) 50 MG 24 hr tablet Take 2 tablets (100 mg total) by mouth daily.  . nitroGLYCERIN (NITROSTAT) 0.4 MG SL tablet Place 1 tablet (0.4 mg total) under the tongue every 5 (five) minutes as needed for chest pain. Reported on 03/18/2016  . spironolactone (ALDACTONE) 25 MG tablet TAKE 0.5 TABLETS BY MOUTH EVERY DAY (Patient taking differently: Take 12.5 mg by mouth daily. )  . umeclidinium-vilanterol (ANORO ELLIPTA) 62.5-25 MCG/INH AEPB Inhale 1 puff into the lungs daily.   No facility-administered encounter medications on file as of 04/29/2020.     Objective:  Wt Readings from Last 3 Encounters:  04/29/20 190 lb 14.4 oz (86.6 kg)  04/08/20 191 lb 14.4 oz (87 kg)  02/23/20 191 lb 14.4 oz (87 kg)    Goals Addressed              This Visit's Progress     Patient Stated   .  COMPLETED: "I need to find a smaller place to live; I can't afford to keep renting this house now that my boyfriend has died. " (pt-stated)  CARE PLAN ENTRY (see longtitudinal plan of care for additional care plan information)   Current Barriers:  . Chronic Disease Management support, education, and care coordination needs related to CHF, HTN, HLD, COPD, DMII, and Depression- patient states she is living with her son and no longer needs to find a place to live  Case Manager Clinical Goal(s):  [name] Over the next 180 days, patient will work with BSW to address needs related to Housing barriers in patient with CHF, HTN, HLD, COPD, DMII, and Depression  Interventions:  . Collaborated with BSW to  initiate plan of care to address needs related to Housing barriers in patient with CHF, HTN, HLD, COPD, DMII, and Depression  Patient Self Care Activities:  . Patient verbalizes understanding of plan to work with BSW to address housing barriers.  Please see past updates related to this goal by clicking on the "Past Updates" button in the selected goal      .  "I'm feeling good right now." (pt-stated)        [place] (see longitudinal plan of care for additional care plan information)  Current Barriers:  . Chronic Disease Management support, education, and care coordination needs related to CHF, HTN, HLD, COPD, DMII, Depression, and tobacco use (smoker) - met with patient after her clinic visits with Debera Lat RD, CDCES and her primary care provider  Clinical Goal(s) related to CHF, HTN, HLD, COPD, DMII, and Depression CHF, HTN, HLD, COPD, DMII, Depression, and tobacco use (smoker):  Over the next 30-60 days, patient will:  . Work with the care management team to address educational, disease management, and care coordination needs  . Begin or continue self health monitoring activities as directed today Measure and record CBG (blood glucose) 3-4 times daily, Measure and record blood pressure one time per week, and Measure and record weight daily . Call provider office for new or worsened signs and symptoms Blood glucose findings outside established parameters, Blood pressure findings outside established parameters, Weight outside established parameters, Chest pain, Shortness of breath, and New or worsened symptom related to HTN,HF, DM, COPD . Call care management team with questions or concerns . Verbalize basic understanding of patient centered plan of care established today  Interventions related to CHF, HTN, HLD, COPD, DMII, Depression, and tobacco use (smoker)  :  . Evaluation of current treatment plans and patient's adherence to plan as established by provider . Assessed patient  understanding of disease states . Assessed patient's education and care coordination needs- discussed heart failure self management strategies and importance of daily weights- provided patient with [place] Management  spiral bound notebook with action plans for HTN, HF, COPD, and DM  . Provided disease specific education to patient  . Collaborated with appropriate clinical care team members regarding patient needs  Patient Self Care Activities related to CHF, HTN, HLD, COPD, DMII, Depression, and tobacco use (smoker)  :  . Patient is unable to independently self-manage chronic health conditions  Initial goal documentation       Other   .  Blood Pressure < 130/90        BP Readings from Last 3 Encounters:  04/29/20 (!) 134/60  04/08/20 (!) 155/67  02/23/20 (!) 148/83       .  HEMOGLOBIN A1C < 7        Lab Results  Component Value Date   HGBA1C 10.7 (A) 04/08/2020   HGBA1C 9.6 (A) 12/26/2019   HGBA1C 9.1 (H) 08/08/2019  Lab Results  Component Value Date   MICROALBUR 23.57 (H) 04/08/2014   LDLCALC 55 04/24/2019   CREATININE 0.84 04/08/2020       .  LDL CALC < 70        Lab Results  Component Value Date   CHOL 163 04/24/2019   HDL 76 04/24/2019   LDLCALC 55 04/24/2019   LDLDIRECT 122.3 03/06/2007   TRIG 160 (H) 04/24/2019   CHOLHDL 2.1 04/24/2019       .  Quit smoking / using tobacco        Progress note from provider on 04/29/20 states patient still smoking        Plan:   The care management team will reach out to the patient again over the next 30-60 days.    Kelli Churn RN, CCM, [facility] RN Care Manager [phone]

## 2020-04-29 NOTE — Progress Notes (Signed)
Ms. Weissman Professional CGM sensor fell off and she was not abel to find it. I was asked to replcae it today. Documentation for Freestyle Libre Pro Continuous glucose monitoring Freestyle Libre Pro CGM sensor placed today. Patient was educated about wearing sensor, keeping food, activity and medication log and when to call office. Patient was educated about how to care for the sensor and not to have an MRI, CT or Diathermy while wearing the sensor. Follow up was arranged with the patient for 1 week.   Lot #: [id number] Serial #: [id number] Expiration Date: 09/01/2020  Debera Lat, RD 04/29/2020 11:36 AM.

## 2020-04-29 NOTE — Patient Instructions (Addendum)
Thank you, [name] for allowing [place] to provide your care today. Today we discussed your diabetes and blood pressure.  For your blood pressure, this is now better with the current medications that she was taking so no changes needs to be made.  Continue eating low-salt diet.  For your diabetes, I do still uncontrolled so we are placing another CGM to check your numbers while you are at home.  We will schedule to see you in 1 week and 2 weeks to get the readings and adjust your medications.  Continue to the decrease fatty foods and carbs in your diet.  Make sure to make an appointment with eye doctor to get your eyes check.  I have ordered the following labs for you:  Lab Orders     Lipid Profile     Glucose, capillary   I will call if any are abnormal. All of your labs can be accessed through "My Chart". We are starting you on an insulin called Tresiba 1. Tresiba 100 mL/unit, inject 0.2 mL into skin per day  Please follow-up in 1 week, then 2 weeks.  Should you have any questions or concerns please call the internal medicine clinic at [phone].    Linwood Dibbles, MD, MPH [facility]   My Chart Access: [email or web address]   If you have not already done so, please get your COVID 19 vaccine  To schedule an appointment for a COVID vaccine choice any of the following: Go to [email or web address]   Go to [email or web address]                  Call [phone]                                     Call [phone] and select Option 2

## 2020-04-29 NOTE — Assessment & Plan Note (Signed)
Denies any shortness of breath.  Back on her home oxygen (2 L at night).  Cough has resolved.  Plan: -Continue Anoro Ellipta and home oxygen.

## 2020-04-29 NOTE — Assessment & Plan Note (Addendum)
Patient has CGM placed during last visit however patient states she lost her CGM and could not find it.  Her blood sugar has been consistently in the 200s at home.  This morning, her blood sugar was 190 fasting.  She denies any polydipsia, headaches, dizziness or polyuria.  She plans to schedule an appointment with her eye doctor.  Plan: Patient still with uncontrolled diabetes with blood sugars in the 200s while on current regimen.  She has had a history of taking insulin, recently in late 2019.  We will start patient on an insulin regimen to get blood sugar under control. -CGM placed today, with 1 week and 2 weeks follow-up scheduled for CGM reading. -Start Tresiba 100 units/mL, 20 units daily. -Continue Victoza 1.8 mg subcu daily -Continue Synjardy 01/999 mg, 1 tablet twice a day -Patient plans to schedule an appointment with ophthalmology for yearly eye exam. Verify that patient has made his appointment.

## 2020-04-29 NOTE — Progress Notes (Signed)
Internal Medicine Clinic Resident  I have personally reviewed this encounter including the documentation in this note and/or discussed this patient with the care management provider. I will address any urgent items identified by the care management provider and will communicate my actions to the patient's PCP. I have reviewed the patient's CCM visit with my supervising attending, Dr Evette Doffing.  Harvie Heck, MD  [facility] PGY-2 04/29/2020

## 2020-04-29 NOTE — Assessment & Plan Note (Signed)
BP improved today in clinic.  Goal for her systolic below 753 and diastolic below 90.  Denies any headaches or dizziness.  We will continue current regimen.  Plan: -Continue spironolactone 12.5 mg daily -Continue metoprolol 100 mg daily -Continue losartan 25 mg daily -Continue heart healthy and low-salt diet

## 2020-04-29 NOTE — Progress Notes (Signed)
Internal Medicine Clinic Attending  CCM services provided by the care management provider and their documentation were discussed with Dr. Marva Panda. We reviewed the pertinent findings, urgent action items addressed by the resident and non-urgent items to be addressed by the PCP.  I agree with the assessment, diagnosis, and plan of care documented in the CCM and resident's note.  Axel Filler, MD 04/29/2020

## 2020-04-29 NOTE — Assessment & Plan Note (Signed)
Obese patient with elevated triglycerides on last lipid panel in 2019.  Currently on atorvastatin 80 mg daily and aspirin 81 mg twice a day.  ASCVD score of 34.5%.  Plan: -Follow-up lipid panel. -Continue high intensity statin. -Continue to recommend smoking cessation, provide resources to help patient quit smoking.

## 2020-04-30 LAB — LIPID PANEL
Chol/HDL Ratio: 2.7 ratio (ref 0.0–4.4)
Cholesterol, Total: 170 mg/dL (ref 100–199)
HDL: 63 mg/dL (ref >39)
LDL Chol Calc (NIH): 93 mg/dL (ref 0–99)
Triglycerides: 74 mg/dL (ref 0–149)
VLDL Cholesterol Cal: 14 mg/dL (ref 5–40)

## 2020-04-30 NOTE — Progress Notes (Signed)
Internal Medicine Clinic Attending  I saw and evaluated the patient.  I personally confirmed the key portions of the history and exam documented by Dr. Amponsah and I reviewed pertinent patient test results.  The assessment, diagnosis, and plan were formulated together and I agree with the documentation in the resident's note.  

## 2020-05-01 ENCOUNTER — Telehealth: Payer: Medicare Other

## 2020-05-06 ENCOUNTER — Encounter: Payer: Self-pay | Admitting: Internal Medicine

## 2020-05-06 ENCOUNTER — Encounter: Payer: Self-pay | Admitting: Dietician

## 2020-05-06 ENCOUNTER — Other Ambulatory Visit: Payer: Self-pay

## 2020-05-06 ENCOUNTER — Ambulatory Visit (INDEPENDENT_AMBULATORY_CARE_PROVIDER_SITE_OTHER): Payer: Medicare Other | Admitting: Internal Medicine

## 2020-05-06 ENCOUNTER — Ambulatory Visit (INDEPENDENT_AMBULATORY_CARE_PROVIDER_SITE_OTHER): Payer: Medicare Other | Admitting: Dietician

## 2020-05-06 DIAGNOSIS — E1142 Type 2 diabetes mellitus with diabetic polyneuropathy: Secondary | ICD-10-CM | POA: Diagnosis not present

## 2020-05-06 MED ORDER — TRESIBA FLEXTOUCH 100 UNIT/ML ~~LOC~~ SOPN: 22.0000 [IU] | PEN_INJECTOR | Freq: Every day | SUBCUTANEOUS | 0 refills | Status: DC

## 2020-05-06 NOTE — Patient Instructions (Addendum)
Good Job taking the new medicnie [name]  The BEST JUICES_-Juices that you do not need to worry about your portion:     Juice that is better BUT still have to add water or limit your portion:      PLease make an appointment for 1 week to remove the sensor and get your final report.

## 2020-05-06 NOTE — Progress Notes (Signed)
Medical Nutrition Therapy:  Appt start time: 0930 end time:  0945. Total time: 15 Visit # 1  05/06/2020 [name] [id number]  Assessment:  Primary concerns today: weight and blood sugar control. [name] uis very happy with the weight loss she has achieved onher Victoza. She is trying to decrease her bread and starch intake.  To help her weight and blood sugars. However, she drinks at least a bottle of juice daily. Her CGM download shows significant post prandial bl;ood sugar spikes with basal glucose reaching close to 70 mg/dl after starting the Tresiba  Preferred Learning Style: No preference indicated  Learning Readiness: Change in progress  ANTHROPOMETRICS: Estimated body mass index is 35.71 kg/m as calculated from the following:   Height as of 04/29/20: 5\' 1"  (1.549 m).   Weight as of an earlier encounter on 05/06/20: 189 lb (85.7 kg).  WEIGHT HISTORY:  Wt Readings from Last 10 Encounters:  05/06/20 189 lb (85.7 kg)  04/29/20 190 lb 14.4 oz (86.6 kg)  04/08/20 191 lb 14.4 oz (87 kg)  02/23/20 191 lb 14.4 oz (87 kg)  02/12/20 200 lb 11.2 oz (91 kg)  01/09/20 198 lb 4.8 oz (89.9 kg)  12/26/19 197 lb 3.2 oz (89.4 kg)  12/03/19 198 lb (89.8 kg)  11/18/19 198 lb (89.8 kg)  08/12/19 201 lb 15.1 oz (91.6 kg)    BLOOD SUGAR: Lab Results  Component Value Date   HGBA1C 10.7 (A) 04/08/2020   HGBA1C 9.6 (A) 12/26/2019   HGBA1C 9.1 (H) 08/08/2019   HGBA1C 8.7 (A) 04/24/2019   HGBA1C 9.0 (A) 03/14/2019   CGM Results from download:   % Time CGM active:   100 %   (Goal >70%)  Average glucose:   201 mg/dL for 8 days  Glucose management indicator:   8.1 %  Time in range (70-180 mg/dL):   44 %   (Goal >70%)  Time High (181-250 mg/dL):   30 %   (Goal < 25%)  Time Very High (>250 mg/dL):    26 %   (Goal < 5%)  Time Low (54-69 mg/dL):   0 %   (Goal <4%)  Time Very Low (<54 mg/dL):   0 %   (Goal <1%)  Coefficient of variation:   38.3 %   (Goal <36%)   SLEEP:not addressed  today MEDICATIONS: she was able to name all her diabetes medicine and the doses  DIETARY INTAKE: Usual eating pattern includes 1-2 meals and . snacks per day. Everyday foods include sandwiches, fruit and juice, chicken.   Dining Out (times/week): 1+ 24-hr recall:  B ( AM): yogurt and juice  L ( PM): chicken wings, juice or a sandwich D ( 8 PM): sloppy joe sandwich and a peach or on Sunday- corn, green beans, fired chicken Beverages: 12+ oz. blueberry juice daily, water  Progress Towards Goal(s):  In progress.   Nutritional Diagnosis:  NB-1.1 Food and nutrition-related knowledge deficit As related to lack of awarenessd of carb content of juices.  As evidenced by her report and blood sugars.    Intervention:  Nutrition education about juices with less sugar., explained CGM download report and significance of food intake and medications.  Action Goal: decrease sugar containing juice consumption to 1/2 12 oz bottle a day by adding water or ice or drinking the light or zero varieties.   Outcome goal: decrease in post prandal blood sugars Coordination of care: discussed with Dr. Sherry Ruffing  Teaching Method Utilized: Visual, Auditory,Hands on Handouts  given during visit include: After visit summary Barriers to learning/adherence to lifestyle change: competing values Demonstrated degree of understanding via:  Teach Back   Monitoring/Evaluation:  Dietary intake, exercise, meter, and body weight 1 week. Debera Lat, RD 05/06/2020 11:47 AM.

## 2020-05-06 NOTE — Assessment & Plan Note (Signed)
[name] wore the CGM for 8 days. The average reading was 201, % time in target was 44, % time below target was 0, and % time above target was. 56. Intervention will be to increase [name] to 22 units and make dietary adjustments. The patient will be scheduled to see [facility] and [facility] for a final appointment.  Patient reports that she has been feeling well reports that she just started the [name] on Thursday.  She discussed with Delphina decreasing her juice intake and her carb intake.  She reported that she will call to make an eye appointment today.  -Continue Victoza 1.8 daily -Continue Synjardy 01-999 mg twice daily -Increase Tresiba to 22 units daily -RTC in 1 week for follow-up

## 2020-05-06 NOTE — Progress Notes (Signed)
CC: DM and Cgm download  HPI:  [name] is a 69 y.o. with history listed below presenting for follow-up of her diabetes and CGM download.  Past Medical History:  Diagnosis Date  . ATN (acute tubular necrosis) ([place]) 07/15/2014  . Automatic implantable cardioverter-defibrillator in situ   . Automatic implantable cardioverter-defibrillator in situ 05/04/2007   Qualifier: Diagnosis of  By: Isla Pence    . Cardiac defibrillator in situ    Atlas II VR ([facility]) implanted by Dr Lovena Le  . CHF (congestive heart failure) ([place])   . Chronic combined systolic and diastolic heart failure (HCC)    a. EF 35-40% in past;  b. Echo 7/13:  EF 45-50%, Gr 2 diast dysfn, mild AI, mild MAC, trivial MR, mild LAE, PASP 47.  [name] Chronic ulcer of leg ([place])    04-09-15 resolved-not a problem.  . Colon polyps 04/12/2013   Rectosigmoid polyp  . COPD (chronic obstructive pulmonary disease) (HCC)    O2 at night  . Depression   . Diabetes mellitus   . Diabetes mellitus, type 2 ([place]) 04/03/2012   HIGH RISK FEET.. Please have patient take shoes and socks off every visit for visual foot inspection.    . Eczema   . Elevated alkaline phosphatase level    GGT and 5'nucleotidase 8/13 normal  . Health care maintenance 01/22/2013   Surgically absent cervix- no pap needed (Path report 07/2000: uterine body with attached bilateral adnexa and separate cervix.)  . History of oxygen administration    oxygen @ 2 l/m nasally bedtime 24/7  . HTN (hypertension)   . Hx of cardiac cath    a. [facility] 2003 normal;  b. [facility] 6/13:  Mild calcification in the LM, o/w normal coronary arteries, EF 45%.   . Hyperkalemia 08/08/2017  . Hyperlipidemia    Elevated triglyceride in 2019  . Hyperlipidemia associated with type 2 diabetes mellitus ([place]) 05/25/2007   Qualifier: Diagnosis of  By: Hassell Done FNP, Tori Milks    . Hyperthyroidism, subclinical   . HYPERTHYROIDISM, SUBCLINICAL 05/06/2009   Qualifier: Diagnosis of  By: Darrick Meigs    .  Implantable cardioverter-defibrillator (ICD) generator end of life   . Lipoma   . Need for hepatitis C screening test 04/25/2019  . NICM (nonischemic cardiomyopathy) ([place])   . Obesity   . On home oxygen therapy    "2L; 24/7" (10/11/2016)  . Post menopausal syndrome   . Shortness of breath 07/14/2017  . Skin rash 07/12/2018  . Sleep apnea    pt denies 04/12/2013   Review of Systems:   Constitutional: Negative for chills and fever.  Respiratory: Negative for shortness of breath.   Cardiovascular: Negative for chest pain and leg swelling.  Gastrointestinal: Negative for abdominal pain, nausea and vomiting.  Neurological: Negative for dizziness and headaches.    Physical Exam:  Vitals:   05/06/20 0910  BP: (!) 110/57  Pulse: 81  Temp: 98 F (36.7 C)  TempSrc: Oral  SpO2: 99%  Weight: 189 lb (85.7 kg)   Physical Exam Constitutional:      Appearance: Normal appearance.  HENT:     Head: Normocephalic and atraumatic.  Cardiovascular:     Rate and Rhythm: Normal rate and regular rhythm.     Pulses: Normal pulses.     Heart sounds: Normal heart sounds.  Pulmonary:     Effort: Pulmonary effort is normal.     Breath sounds: Normal breath sounds.  Abdominal:     General: Abdomen  is flat. Bowel sounds are normal.     Palpations: Abdomen is soft.  Musculoskeletal:        General: No swelling. Normal range of motion.  Skin:    General: Skin is warm and dry.     Capillary Refill: Capillary refill takes less than 2 seconds.  Neurological:     General: No focal deficit present.     Mental Status: She is alert and oriented to person, place, and time.  Psychiatric:        Mood and Affect: Mood normal.        Behavior: Behavior normal.     Assessment & Plan:   See Encounters Tab for problem based charting.  Patient discussed with Dr. Daryll Drown

## 2020-05-06 NOTE — Patient Instructions (Signed)
[name],  It was a pleasure to see you today. Thank you for coming in.   Today we discussed your diabetes. In regards to this please make the changes that you discussed with [place]. Please increase the Tresiba to 22 units daily. Please continue taking the Victoza and Synjardy. Please make an appointment with the eye doctor.   We also discussed your blood pressure. Your blood pressure looks good today. Please continue your current medications.    Please return in 7 days with [place] for Download #2.  Thank you again for coming in.   [name]

## 2020-05-11 NOTE — Telephone Encounter (Signed)
Spoke with patient on 04/10/2020. She stated that she no longer needed assistance that she had moved in with a family member. Referral closed on 04/10/2020.

## 2020-05-11 NOTE — Progress Notes (Signed)
Internal Medicine Clinic Attending  Case discussed with Dr. Sherry Ruffing  At the time of the visit.  We reviewed the resident's history and exam and pertinent patient test results.  I agree with the assessment, diagnosis, and plan of care documented in the resident's note.  We reviewed CGM download and plan.

## 2020-05-13 ENCOUNTER — Ambulatory Visit (INDEPENDENT_AMBULATORY_CARE_PROVIDER_SITE_OTHER): Payer: Medicare Other | Admitting: Dietician

## 2020-05-13 ENCOUNTER — Encounter: Payer: Self-pay | Admitting: Dietician

## 2020-05-13 DIAGNOSIS — E1142 Type 2 diabetes mellitus with diabetic polyneuropathy: Secondary | ICD-10-CM | POA: Diagnosis not present

## 2020-05-13 NOTE — Patient Instructions (Signed)
Hi Ms Pledger,   Remember it is best to keep  The Victoza and Tresiba insulin pens you are using out of the refrigerator. ( The ones you have not opened can be kept in the refrigerator)   Inject in areas that do not have hard lumps, scars or bruises. Use a new spot to inject your medicines each time.  Grapes and all fruits have sugar in them so it is important to have small portions. Vegetables and other lower sugar and starch snacks ideas are on the handout I gave you.   See you next week!  [place] [phone]

## 2020-05-13 NOTE — Progress Notes (Signed)
  Medical Nutrition Therapy:  Appt start time: 0845 end time:  0905. Total time: 20 Visit # 2  05/13/2020 [name] [id number]  Assessment:  Primary concerns today: weight and blood sugar control. [name] comes in for her 2nd and final professional CGM download. She reports adding water to her juice and trying to eat smaller portions of bread and starch.  Her CGM download shows improvement overall but with a two day period of continuous above goal blood sugars from Sunday afternoon through Tuesday afternoon. She denies missing any medicine. she states that she doe shave hard lumps on her abdomen and keeps her insulin and victoza in the refrigerator. She thinks her higher blood sugars were due to cooking with her sister and snacking on grapes  BLOOD SUGAR: she reports her blood sugar was 1117 this am.  CGM Results from download:  week 1   Week 2  % Time CGM active:   100 %   (Goal >70%)  100%  Average glucose:   201 mg/dL for 8 days  186%  Glucose management indicator:   8.1 %  7.8%  Time in range (70-180 mg/dL):   44 %   (Goal >70%)  53%  Time High (181-250 mg/dL):   30 %   (Goal < 25%)  27%  Time Very High (>250 mg/dL):    26 %   (Goal < 5%)  20%  Time Low (54-69 mg/dL):   0 %   (Goal <4%)  0%  Time Very Low (<54 mg/dL):   0 %   (Goal <1%)  0%  Coefficient of variation:   38.3 %   (Goal <36%)  42.5%   SLEEP:not addressed today MEDICATIONS: she confirmed increased dose of insulin  Progress Towards Goal(s):  In progress.   Nutritional Diagnosis:  NB-1.1 Food and nutrition-related knowledge deficit As related to lack of awarenessd of carb content of juices.  As evidenced by her report and blood sugars.    Intervention:  Nutrition education reinforcement about carbs ( grapes) explained CGM download report and significance of food intake, medication taking and absorption. Advised her to keep the insulin and victoza she is using with her meter and other medication out of the refrigerator  and rotate to healthy sites that do not have hard lumps.  Action Goal: continue with decreased sugar containing juice and fruit consumption, use healthy injection sites by rotating sites correctly and keeping medicines out where she will see them  Outcome goal: decrease in post prandal blood sugars Coordination of care: discuss cgm download with physician she sees next week  Teaching Method Utilized: Visual, Auditory Handouts given during visit include: After visit summary Barriers to learning/adherence to lifestyle change: competing values Demonstrated degree of understanding via:  Teach Back   Monitoring/Evaluation:  Dietary intake, exercise, meter, and body weight 1 week per patient. [name] Kamali Sakata, RD 05/13/2020 9:28 AM.

## 2020-05-18 ENCOUNTER — Encounter: Payer: Medicare Other | Admitting: Internal Medicine

## 2020-05-18 ENCOUNTER — Encounter: Payer: Medicare Other | Admitting: Dietician

## 2020-05-21 ENCOUNTER — Telehealth: Payer: Self-pay | Admitting: *Deleted

## 2020-05-21 ENCOUNTER — Telehealth: Payer: Medicare Other

## 2020-05-21 NOTE — Telephone Encounter (Signed)
  Chronic Care Management   Outreach Note  05/21/2020 Name: [name] MRN: [id number] DOB: 09-05-1951  Referred by: Maudie Mercury, MD Reason for referral : Chronic Care Management (HF, DM, HTN, COPD)   An unsuccessful telephone outreach was attempted today. The patient was referred to the case management team for assistance with care management and care coordination. Unable to leave message as recording states "mailbox is full"  Follow Up Plan: The care management team will reach out to the patient again over the next 7-14 days.   Kelli Churn RN, CCM, [facility] RN Care Manager [phone]

## 2020-05-28 ENCOUNTER — Ambulatory Visit: Payer: Medicare Other | Admitting: *Deleted

## 2020-05-28 DIAGNOSIS — E1142 Type 2 diabetes mellitus with diabetic polyneuropathy: Secondary | ICD-10-CM

## 2020-05-28 DIAGNOSIS — J449 Chronic obstructive pulmonary disease, unspecified: Secondary | ICD-10-CM

## 2020-05-28 DIAGNOSIS — Z95810 Presence of automatic (implantable) cardiac defibrillator: Secondary | ICD-10-CM

## 2020-05-28 DIAGNOSIS — I10 Essential (primary) hypertension: Secondary | ICD-10-CM

## 2020-05-28 NOTE — Patient Instructions (Signed)
Visit Information It was nice speaking with you today. Please make a follow up clinic appointment.  Goals Addressed              This Visit's Progress     Patient Stated   .  "I'm feeling good right now." (pt-stated)        CARE PLAN ENTRY (see longitudinal plan of care for additional care plan information)  Current Barriers:  . Chronic Disease Management support, education, and care coordination needs related to CHF, HTN, HLD, COPD, DMII, Depression, and tobacco use (smoker) - spoke with patient, says she always checks a fasting blood sugar and records it, sometimes checks before supper, she reported the following fasting blood sugars in the last week 89,118, [phone];  one pre supper reading of 91, she said she felt "Low" when she was visiting her cousin but did not check her blood sugar, she was able to self treat with small amount of Pepsi, she reports good medication taking behavior, say she smokes 3-4 cigarettes each day and her son "fusses" at her for smoking as she is now living with him, she says she never smokes if she is using her O2, says she is still having trouble using her home blood pressure monitor, patient says she knows she is supposed to make a follow up clinic appointment but she had an unexpected death in her family and she had to travel to [place]. for the funeral  Clinical Goal(s) related to CHF, HTN, HLD, COPD, DMII, and Depression CHF, HTN, HLD, COPD, DMII, Depression, and tobacco use (smoker):  Over the next 30-60 days, patient will:  . Work with the care management team to address educational, disease management, and care coordination needs  . Begin or continue self health monitoring activities as directed today Measure and record CBG (blood glucose) 3-4 times daily, Measure and record blood pressure one time per week, and Measure and record weight daily . Call provider office for new or worsened signs and symptoms Blood glucose findings outside established  parameters, Blood pressure findings outside established parameters, Weight outside established parameters, Chest pain, Shortness of breath, and New or worsened symptom related to HTN,HF, DM, COPD . Call care management team with questions or concerns . Verbalize basic understanding of patient centered plan of care established today  Interventions related to CHF, HTN, HLD, COPD, DMII, Depression, and tobacco use (smoker)  :  . Evaluation of current treatment plans and patient's adherence to plan as established by provider . Assessed patient understanding of disease states . Reviewed medications with patient and discussed medication taking behavior . Assessed patient's education and care coordination needs . Provided disease specific education to patient - reviewed blood sugar targets for pre and post meal, reviewed how to treat hypoglycemia, encouraged patient to check CBG in pairs i.e. before and after a meal at least 2-3 times weekly . Collaborated with appropriate clinical care team members regarding patient needs- reviewed Zephyr Plyler's RD, CDCES of 8/11 and provider's notes of 8/4 with patient . Discussed plans with patient for ongoing care management follow up and ensured patient has contact number for CCM RN . Encouraged patient to make follow up clinic appointment  Patient Self Care Activities related to CHF, HTN, HLD, COPD, DMII, Depression, and tobacco use (smoker)  :  . Patient is unable to independently self-manage chronic health conditions  Please see past updates related to this goal by clicking on the "Past Updates" button in the selected goal  Other   .  Blood Pressure < 130/90        BP Readings from Last 3 Encounters:  05/06/20 (!) 110/57  04/29/20 (!) 134/60  04/08/20 (!) 155/67    Offered to assist patient with teaching her how to take her blood pressure the next time she is in the clinic.    [name]  HEMOGLOBIN A1C < 7        Lab Results  Component Value Date    HGBA1C 10.7 (A) 04/08/2020   HGBA1C 9.6 (A) 12/26/2019   HGBA1C 9.1 (H) 08/08/2019   Lab Results  Component Value Date   MICROALBUR 23.57 (H) 04/08/2014   LDLCALC 93 04/29/2020   CREATININE 0.84 04/08/2020     Not meeting Hgb A1C target    .  Quit smoking / using tobacco        05/28/20 patient states she is smoking 3-4 cigarettes daily. Offered smoking cessation assistance; patient declined.       The patient verbalized understanding of instructions provided today and declined a print copy of patient instruction materials.   The care management team will reach out to the patient again over the next 30-60 days.   Kelli Churn RN, CCM, [facility] RN Care Manager [phone]

## 2020-05-28 NOTE — Progress Notes (Signed)
Internal Medicine Clinic Attending  CCM services provided by the care management provider and their documentation were discussed with Dr. Basaraba. We reviewed the pertinent findings, urgent action items addressed by the resident and non-urgent items to be addressed by the PCP.  I agree with the assessment, diagnosis, and plan of care documented in the CCM and resident's note.  Duncan Thomas Vincent, MD 05/28/2020  

## 2020-05-28 NOTE — Chronic Care Management (AMB) (Signed)
Chronic Care Management   Follow Up Note   05/28/2020 Name: [name] MRN: [id number] DOB: 1951-09-13  Referred by: Maudie Mercury, MD Reason for referral : Chronic Care Management (HF, IDDM, HTN, COPD)   [name] is a 69 y.o. year old female who is a primary care patient of Maudie Mercury, MD. The CCM team was consulted for assistance with chronic disease management and care coordination needs.    Review of patient status, including review of consultants reports, relevant laboratory and other test results, and collaboration with appropriate care team members and the patient's provider was performed as part of comprehensive patient evaluation and provision of chronic care management services.    SDOH (Social Determinants of Health) assessments performed: No See Care Plan activities for detailed interventions related to [facility])     Outpatient Encounter Medications as of 05/28/2020  Medication Sig  . aspirin EC 81 MG tablet Take 1 tablet (81 mg total) by mouth 2 (two) times daily.  [name] atorvastatin (LIPITOR) 80 MG tablet Take 1 tablet (80 mg total) by mouth at bedtime.  . Blood Glucose Monitoring Suppl (CONTOUR NEXT MONITOR) w/Device KIT 1 Act by Does not apply route 4 (four) times daily.  . calcium-vitamin D (OSCAL WITH D) 500-200 MG-UNIT tablet Take 1 tablet by mouth 2 (two) times daily.  . clobetasol ointment (TEMOVATE) 0.97 % Apply 1 application topically 2 (two) times daily as needed (Skin rash).   . Empagliflozin-metFORMIN HCl (SYNJARDY) 01-999 MG TABS Take 1 tablet by mouth 2 (two) times daily.  . furosemide (LASIX) 20 MG tablet TAKE 3 TABLETS(60 MG) BY MOUTH TWICE DAILY  . glucose blood (CONTOUR NEXT TEST) test strip Use as instructed  . insulin degludec (TRESIBA FLEXTOUCH) 100 UNIT/ML FlexTouch Pen Inject 0.22 mLs (22 Units total) into the skin daily.  . Insulin Pen Needle (PEN NEEDLES) 32G X 4 MM MISC Use to inject insulin once a day  . Lancets Misc. (ACCU-CHEK FASTCLIX  LANCET) KIT Lancing device, use with lancets for blood glucose monitoring.  . Lancets MISC 1 Act by Does not apply route 4 (four) times daily.  [name] liraglutide (VICTOZA) 18 MG/3ML SOPN Inject 1.94m daily into the skin (Patient taking differently: Inject 1.8 mg into the skin daily. Inject 1.856mdaily into the skin)  . losartan (COZAAR) 25 MG tablet TAKE 1 TABLET(25 MG) BY MOUTH DAILY  . metoprolol succinate (TOPROL-XL) 50 MG 24 hr tablet Take 2 tablets (100 mg total) by mouth daily.  . nitroGLYCERIN (NITROSTAT) 0.4 MG SL tablet Place 1 tablet (0.4 mg total) under the tongue every 5 (five) minutes as needed for chest pain. Reported on 03/18/2016  . spironolactone (ALDACTONE) 25 MG tablet TAKE 0.5 TABLETS BY MOUTH EVERY DAY (Patient taking differently: Take 12.5 mg by mouth daily. )  . umeclidinium-vilanterol (ANORO ELLIPTA) 62.5-25 MCG/INH AEPB Inhale 1 puff into the lungs daily.   No facility-administered encounter medications on file as of 05/28/2020.     Objective:  Wt Readings from Last 3 Encounters:  05/06/20 189 lb (85.7 kg)  04/29/20 190 lb 14.4 oz (86.6 kg)  04/08/20 191 lb 14.4 oz (87 kg)    Goals Addressed              This Visit's Progress     Patient Stated   .  "I'm feeling good right now." (pt-stated)        CARE PLAN ENTRY (see longitudinal plan of care for additional care plan information)  Current Barriers:  .  Chronic Disease Management support, education, and care coordination needs related to CHF, HTN, HLD, COPD, DMII, Depression, and tobacco use (smoker) - spoke with patient, says she always checks a fasting blood sugar and records it, sometimes checks before supper, she reported the following fasting blood sugars in the last week 89,118, [phone];  one pre supper reading of 91, she said she felt "Low" when she was visiting her cousin but did not check her blood sugar, she was able to self treat with small amount of Pepsi, she reports good medication taking  behavior, say she smokes 3-4 cigarettes each day and her son "fusses" at her for smoking as she is now living with him, she says she never smokes if she is using her O2, says she is still having trouble using her home blood pressure monitor, patient says she knows she is supposed to make a follow up clinic appointment but she had an unexpected death in her family and she had to travel to [place]. for the funeral  Clinical Goal(s) related to CHF, HTN, HLD, COPD, DMII, and Depression CHF, HTN, HLD, COPD, DMII, Depression, and tobacco use (smoker):  Over the next 30-60 days, patient will:  . Work with the care management team to address educational, disease management, and care coordination needs  . Begin or continue self health monitoring activities as directed today Measure and record CBG (blood glucose) 3-4 times daily, Measure and record blood pressure one time per week, and Measure and record weight daily . Call provider office for new or worsened signs and symptoms Blood glucose findings outside established parameters, Blood pressure findings outside established parameters, Weight outside established parameters, Chest pain, Shortness of breath, and New or worsened symptom related to HTN,HF, DM, COPD . Call care management team with questions or concerns . Verbalize basic understanding of patient centered plan of care established today  Interventions related to CHF, HTN, HLD, COPD, DMII, Depression, and tobacco use (smoker)  :  . Evaluation of current treatment plans and patient's adherence to plan as established by provider . Assessed patient understanding of disease states . Reviewed medications with patient and discussed medication taking behavior . Assessed patient's education and care coordination needs . Provided disease specific education to patient - reviewed blood sugar targets for pre and post meal, reviewed how to treat hypoglycemia, encouraged patient to check CBG in pairs i.e. before and  after a meal at least 2-3 times weekly . Collaborated with appropriate clinical care team members regarding patient needs- reviewed Ailin Plyler's RD, CDCES of 8/11 and provider's notes of 8/4 with patient . Discussed plans with patient for ongoing care management follow up and ensured patient has contact number for CCM RN . Encouraged patient to make follow up clinic appointment  Patient Self Care Activities related to CHF, HTN, HLD, COPD, DMII, Depression, and tobacco use (smoker)  :  . Patient is unable to independently self-manage chronic health conditions  Please see past updates related to this goal by clicking on the "Past Updates" button in the selected goal        Other   .  Blood Pressure < 130/90        BP Readings from Last 3 Encounters:  05/06/20 (!) 110/57  04/29/20 (!) 134/60  04/08/20 (!) 155/67    Offered to assist patient again with teaching her how to take blood pressure the next time she is in the clinic.     [name]  HEMOGLOBIN A1C < 7  Lab Results  Component Value Date   HGBA1C 10.7 (A) 04/08/2020   HGBA1C 9.6 (A) 12/26/2019   HGBA1C 9.1 (H) 08/08/2019   Lab Results  Component Value Date   MICROALBUR 23.57 (H) 04/08/2014   LDLCALC 93 04/29/2020   CREATININE 0.84 04/08/2020     Not meeting Hgb A1C target    .  Quit smoking / using tobacco        05/28/20 patient states she is smoking 3-4 cigarettes daily. Offered smoking cessation assistance; patient declined.        Plan:   The care management team will reach out to the patient again over the next 30-60 days.    Kelli Churn RN, CCM, [facility] RN Care Manager [phone]

## 2020-05-28 NOTE — Progress Notes (Signed)
Internal Medicine Clinic Resident  I have personally reviewed this encounter including the documentation in this note and/or discussed this patient with the care management provider. I will address any urgent items identified by the care management provider and will communicate my actions to the patient's PCP. I have reviewed the patient's CCM visit with my supervising attending, Dr Vincent.  Boots Mcglown, MD 05/28/2020    

## 2020-05-29 DIAGNOSIS — J449 Chronic obstructive pulmonary disease, unspecified: Secondary | ICD-10-CM | POA: Diagnosis not present

## 2020-05-29 DIAGNOSIS — I509 Heart failure, unspecified: Secondary | ICD-10-CM | POA: Diagnosis not present

## 2020-06-04 ENCOUNTER — Ambulatory Visit (INDEPENDENT_AMBULATORY_CARE_PROVIDER_SITE_OTHER): Payer: Medicare Other | Admitting: Emergency Medicine

## 2020-06-04 ENCOUNTER — Other Ambulatory Visit: Payer: Self-pay

## 2020-06-04 DIAGNOSIS — Z95810 Presence of automatic (implantable) cardiac defibrillator: Secondary | ICD-10-CM

## 2020-06-04 DIAGNOSIS — I5042 Chronic combined systolic (congestive) and diastolic (congestive) heart failure: Secondary | ICD-10-CM

## 2020-06-04 LAB — CUP PACEART INCLINIC DEVICE CHECK
Battery Remaining Longevity: 52 mo
Brady Statistic RV Percent Paced: 0 %
Date Time Interrogation Session: 20210902084614
HighPow Impedance: 51.9641
Implantable Lead Implant Date: 20080801
Implantable Lead Location: 753860
Implantable Lead Model: 7120
Implantable Pulse Generator Implant Date: 20150701
Lead Channel Impedance Value: 462.5 Ohm
Lead Channel Pacing Threshold Amplitude: 0.5 V
Lead Channel Pacing Threshold Pulse Width: 0.5 ms
Lead Channel Sensing Intrinsic Amplitude: 11.9 mV
Lead Channel Setting Pacing Amplitude: 2.5 V
Lead Channel Setting Pacing Pulse Width: 0.5 ms
Lead Channel Setting Sensing Sensitivity: 0.5 mV
Pulse Gen Serial Number: 7206538

## 2020-06-04 NOTE — Progress Notes (Signed)
ICD check in clinic. Normal device function. Thresholds and sensing consistent with previous device measurements. Impedance trends stable over time. No ventricular arrhythmias. Histogram distribution appropriate for patient and level of activity. No changes made this session. Device programmed at appropriate safety margins. Device programmed to optimize intrinsic conduction. Estimated longevity 4 yrs, 4 months. Pt enrolled in remote follow-up. Patient education completed including shock plan. In clinic check in 3 months.[name]

## 2020-06-26 ENCOUNTER — Telehealth: Payer: Medicare Other

## 2020-06-26 ENCOUNTER — Telehealth: Payer: Self-pay | Admitting: *Deleted

## 2020-06-26 NOTE — Telephone Encounter (Signed)
°  Chronic Care Management   Outreach Note  06/26/2020 Name: [name] MRN: [id number] DOB: 1951/02/23  Referred by: Maudie Mercury, MD Reason for referral : Chronic Care Management (IDDM, HTN, HF, COPD)   An unsuccessful telephone outreach was attempted today to complete chronic care management follow up assessment. The patient was referred to the case management team for assistance with care management and care coordination.   Follow Up Plan: A HIPAA compliant phone message was left for the patient providing contact information and requesting a return call.  The care management team will reach out to the patient again over the next 7-10 days.   Kelli Churn RN, CCM, [facility] RN Care Manager [phone]

## 2020-06-29 DIAGNOSIS — I509 Heart failure, unspecified: Secondary | ICD-10-CM | POA: Diagnosis not present

## 2020-06-29 DIAGNOSIS — J449 Chronic obstructive pulmonary disease, unspecified: Secondary | ICD-10-CM | POA: Diagnosis not present

## 2020-07-01 ENCOUNTER — Telehealth: Payer: Medicare Other

## 2020-07-02 ENCOUNTER — Ambulatory Visit: Payer: Medicare Other | Admitting: *Deleted

## 2020-07-02 DIAGNOSIS — I10 Essential (primary) hypertension: Secondary | ICD-10-CM

## 2020-07-02 DIAGNOSIS — E1142 Type 2 diabetes mellitus with diabetic polyneuropathy: Secondary | ICD-10-CM

## 2020-07-02 DIAGNOSIS — J449 Chronic obstructive pulmonary disease, unspecified: Secondary | ICD-10-CM

## 2020-07-02 DIAGNOSIS — Z95810 Presence of automatic (implantable) cardiac defibrillator: Secondary | ICD-10-CM

## 2020-07-02 DIAGNOSIS — I5042 Chronic combined systolic (congestive) and diastolic (congestive) heart failure: Secondary | ICD-10-CM

## 2020-07-02 NOTE — Chronic Care Management (AMB) (Signed)
Chronic Care Management   Follow Up Note   07/02/2020 Name: [name] MRN: [id number] DOB: 02/23/1951  Referred by: Maudie Mercury, MD Reason for referral : Chronic Care Management ( IDDM, HTN, HF, COPD, smokes, has home O2, has ICD- implantable cardiac defib)   [name] is a 69 y.o. year old female who is a primary care patient of Maudie Mercury, MD. The CCM team was consulted for assistance with chronic disease management and care coordination needs.    Review of patient status, including review of consultants reports, relevant laboratory and other test results, and collaboration with appropriate care team members and the patient's provider was performed as part of comprehensive patient evaluation and provision of chronic care management services.    SDOH (Social Determinants of Health) assessments performed: No See Care Plan activities for detailed interventions related to [facility])     Outpatient Encounter Medications as of 07/02/2020  Medication Sig  . aspirin EC 81 MG tablet Take 1 tablet (81 mg total) by mouth 2 (two) times daily.  [name] atorvastatin (LIPITOR) 80 MG tablet Take 1 tablet (80 mg total) by mouth at bedtime.  . Blood Glucose Monitoring Suppl (CONTOUR NEXT MONITOR) w/Device KIT 1 Act by Does not apply route 4 (four) times daily.  . calcium-vitamin D (OSCAL WITH D) 500-200 MG-UNIT tablet Take 1 tablet by mouth 2 (two) times daily.  . clobetasol ointment (TEMOVATE) 1.24 % Apply 1 application topically 2 (two) times daily as needed (Skin rash).   . Empagliflozin-metFORMIN HCl (SYNJARDY) 01-999 MG TABS Take 1 tablet by mouth 2 (two) times daily.  . furosemide (LASIX) 20 MG tablet TAKE 3 TABLETS(60 MG) BY MOUTH TWICE DAILY  . glucose blood (CONTOUR NEXT TEST) test strip Use as instructed  . insulin degludec (TRESIBA FLEXTOUCH) 100 UNIT/ML FlexTouch Pen Inject 0.22 mLs (22 Units total) into the skin daily.  . Insulin Pen Needle (PEN NEEDLES) 32G X 4 MM MISC Use to inject  insulin once a day  . Lancets Misc. (ACCU-CHEK FASTCLIX LANCET) KIT Lancing device, use with lancets for blood glucose monitoring.  . Lancets MISC 1 Act by Does not apply route 4 (four) times daily.  [name] liraglutide (VICTOZA) 18 MG/3ML SOPN Inject 1.88m daily into the skin (Patient taking differently: Inject 1.8 mg into the skin daily. Inject 1.867mdaily into the skin)  . losartan (COZAAR) 25 MG tablet TAKE 1 TABLET(25 MG) BY MOUTH DAILY  . metoprolol succinate (TOPROL-XL) 50 MG 24 hr tablet Take 2 tablets (100 mg total) by mouth daily.  . nitroGLYCERIN (NITROSTAT) 0.4 MG SL tablet Place 1 tablet (0.4 mg total) under the tongue every 5 (five) minutes as needed for chest pain. Reported on 03/18/2016  . spironolactone (ALDACTONE) 25 MG tablet TAKE 0.5 TABLETS BY MOUTH EVERY DAY (Patient taking differently: Take 12.5 mg by mouth daily. )  . umeclidinium-vilanterol (ANORO ELLIPTA) 62.5-25 MCG/INH AEPB Inhale 1 puff into the lungs daily.   No facility-administered encounter medications on file as of 07/02/2020.     Objective:  Wt Readings from Last 3 Encounters:  05/06/20 189 lb (85.7 kg)  04/29/20 190 lb 14.4 oz (86.6 kg)  04/08/20 191 lb 14.4 oz (87 kg)   Lab Results  Component Value Date   HGBA1C 10.7 (A) 04/08/2020   HGBA1C 9.6 (A) 12/26/2019   HGBA1C 9.1 (H) 08/08/2019   Lab Results  Component Value Date   MICROALBUR 23.57 (H) 04/08/2014   LDLCALC 93 04/29/2020   CREATININE 0.84 04/08/2020  BP Readings from Last 3 Encounters:  05/06/20 (!) 110/57  04/29/20 (!) 134/60  04/08/20 (!) 155/67    Goals Addressed              This Visit's Progress     Patient Stated   .  "I'm feeling good right now." (pt-stated)        CARE PLAN ENTRY (see longitudinal plan of care for additional care plan information)  Current Barriers:  . Chronic Disease Management support, education, and care coordination needs related to CHF, HTN, HLD, COPD, DMII, Depression, and tobacco use (smoker) -  spoke with patient, says she always checks a fasting blood sugar and records it, sometimes checks before supper, she reported the following fasting blood sugars in the last week 98,99,110,120;  she reports one episode of hypoglycemia that she was able to self treat, she reports good medication taking behavior, say she smokes 3-4 cigarettes each day and her son "fusses" at her for smoking as she is now living with him, she says she never smokes if she is using her O2, says she is still having trouble using her home blood pressure monitor, says she will complete her eye appointment in November  Clinical Goal(s) related to CHF, HTN, HLD, COPD, DMII, and Depression CHF, HTN, HLD, COPD, DMII, Depression, and tobacco use (smoker):  Over the next 30-60 days, patient will:  . Work with the care management team to address educational, disease management, and care coordination needs  . Begin or continue self health monitoring activities as directed today Measure and record CBG (blood glucose) 3-4 times daily, Measure and record blood pressure one time per week, and Measure and record weight daily . Call provider office for new or worsened signs and symptoms Blood glucose findings outside established parameters, Blood pressure findings outside established parameters, Weight outside established parameters, Chest pain, Shortness of breath, and New or worsened symptom related to HTN,HF, DM, COPD . Call care management team with questions or concerns . Verbalize basic understanding of patient centered plan of care established today  Interventions related to CHF, HTN, HLD, COPD, DMII, Depression, and tobacco use (smoker)  :  . Evaluation of current treatment plans and patient's adherence to plan as established by provider . Assessed patient understanding of disease states . Reviewed medications with patient and discussed medication taking behavior . Assessed patient's education and care coordination needs . Provided  disease specific education to patient - reviewed blood sugar targets for pre and post meal, reviewed how to treat hypoglycemia, again encouraged patient to check CBG in pairs i.e. before and after a meal at least 2-3 times weekly . Collaborated with appropriate clinical care team members regarding patient needs . Discussed plans with patient for ongoing care management follow up and ensured patient has contact number for CCM RN . Encouraged patient to make follow up clinic appointment  Patient Self Care Activities related to CHF, HTN, HLD, COPD, DMII, Depression, and tobacco use (smoker)  :  . Patient is unable to independently self-manage chronic health conditions  Please see past updates related to this goal by clicking on the "Past Updates" button in the selected goal          Plan:   The care management team will reach out to the patient again over the next 30-60 days.    Kelli Churn RN, CCM, [facility] RN Care Manager [phone]

## 2020-07-02 NOTE — Patient Instructions (Signed)
Visit Information It was nice speaking with you today. Goals Addressed              This Visit's Progress     Patient Stated   .  "I'm feeling good right now." (pt-stated)        CARE PLAN ENTRY (see longitudinal plan of care for additional care plan information)  Current Barriers:  . Chronic Disease Management support, education, and care coordination needs related to CHF, HTN, HLD, COPD, DMII, Depression, and tobacco use (smoker) - spoke with patient, says she always checks a fasting blood sugar and records it, sometimes checks before supper, she reported the following fasting blood sugars in the last week 98,99,110,120;  she reports one episode of hypoglycemia that she was able to self treat, she reports good medication taking behavior, say she smokes 3-4 cigarettes each day and her son "fusses" at her for smoking as she is now living with him, she says she never smokes if she is using her O2, says she is still having trouble using her home blood pressure monitor, says she will complete her eye appointment in November  Clinical Goal(s) related to CHF, HTN, HLD, COPD, DMII, and Depression CHF, HTN, HLD, COPD, DMII, Depression, and tobacco use (smoker):  Over the next 30-60 days, patient will:  . Work with the care management team to address educational, disease management, and care coordination needs  . Begin or continue self health monitoring activities as directed today Measure and record CBG (blood glucose) 3-4 times daily, Measure and record blood pressure one time per week, and Measure and record weight daily . Call provider office for new or worsened signs and symptoms Blood glucose findings outside established parameters, Blood pressure findings outside established parameters, Weight outside established parameters, Chest pain, Shortness of breath, and New or worsened symptom related to HTN,HF, DM, COPD . Call care management team with questions or concerns . Verbalize basic  understanding of patient centered plan of care established today  Interventions related to CHF, HTN, HLD, COPD, DMII, Depression, and tobacco use (smoker)  :  . Evaluation of current treatment plans and patient's adherence to plan as established by provider . Assessed patient understanding of disease states . Reviewed medications with patient and discussed medication taking behavior . Assessed patient's education and care coordination needs . Provided disease specific education to patient - reviewed blood sugar targets for pre and post meal, reviewed how to treat hypoglycemia, again encouraged patient to check CBG in pairs i.e. before and after a meal at least 2-3 times weekly . Collaborated with appropriate clinical care team members regarding patient needs . Discussed plans with patient for ongoing care management follow up and ensured patient has contact number for CCM RN . Encouraged patient to make follow up clinic appointment  Patient Self Care Activities related to CHF, HTN, HLD, COPD, DMII, Depression, and tobacco use (smoker)  :  . Patient is unable to independently self-manage chronic health conditions  Please see past updates related to this goal by clicking on the "Past Updates" button in the selected goal         The patient verbalized understanding of instructions provided today and declined a print copy of patient instruction materials.   The care management team will reach out to the patient again over the next 30-60 days.   Kelli Churn RN, CCM, [facility] RN Care Manager [phone]

## 2020-07-03 NOTE — Progress Notes (Signed)
Internal Medicine Clinic Attending  CCM services provided by the care management provider and their documentation were discussed with Dr. Eileen Stanford. We reviewed the pertinent findings, urgent action items addressed by the resident and non-urgent items to be addressed by the PCP.  I agree with the assessment, diagnosis, and plan of care documented in the CCM and resident's note.  Aldine Contes, MD 07/03/2020

## 2020-07-03 NOTE — Progress Notes (Signed)
Internal Medicine Clinic Resident  I have personally reviewed this encounter including the documentation in this note and/or discussed this patient with the care management provider. I will address any urgent items identified by the care management provider and will communicate my actions to the patient's PCP. I have reviewed the patient's CCM visit with my supervising attending, Dr Dareen Piano.  Jean Rosenthal, MD 07/03/2020

## 2020-07-08 ENCOUNTER — Ambulatory Visit (INDEPENDENT_AMBULATORY_CARE_PROVIDER_SITE_OTHER): Payer: Medicare Other | Admitting: Student

## 2020-07-08 ENCOUNTER — Other Ambulatory Visit: Payer: Self-pay

## 2020-07-08 ENCOUNTER — Encounter: Payer: Self-pay | Admitting: Student

## 2020-07-08 ENCOUNTER — Telehealth: Payer: Self-pay | Admitting: Internal Medicine

## 2020-07-08 DIAGNOSIS — R059 Cough, unspecified: Secondary | ICD-10-CM | POA: Insufficient documentation

## 2020-07-08 MED ORDER — ALBUTEROL SULFATE HFA 108 (90 BASE) MCG/ACT IN AERS: 2.0000 | INHALATION_SPRAY | Freq: Four times a day (QID) | RESPIRATORY_TRACT | 2 refills | Status: DC | PRN

## 2020-07-08 NOTE — Telephone Encounter (Signed)
TC to patient, she c/o cough, congestion, and mild SOB X 1 week.  No distress noted during phone conversation.  She denies fever.  She has received both Covid-19 vaccines.   Telehealth appt made for today, red team/Dr. Coy Saunas for 2:45 SChaplin, RN,BSN

## 2020-07-08 NOTE — Telephone Encounter (Signed)
Pt has a new cold, pt doesn't want to be test for COVID; pt wants an appt. pls contact [phone]

## 2020-07-08 NOTE — Assessment & Plan Note (Addendum)
Patient assessed via a telephone encounter. Patient reported a 1-week history of intermittent cough that is sometimes productive of light yellow phlegm and worse at night. Patient reports associated congestion and mild shortness of breath but denies any chest tightness, fever, chills, headaches, N/V, or sick contacts. Patient states she has tried OTC nighttime cough syrup and cold medicines without any relief.  She ordered Mucinex that is being delivered to her.  Of note, patient reports going to two funerals about 2 weeks ago where she wore her face mask but took it off at times during the interactions. Patient is vaccinated against Covid with the Moderna vaccine. Patient has a history of COPD on 2 L of O2 at home and is on long-acting beta agonist but no albuterol at the moment. States she ran out of the prescription and it was never filled.  Plan: --Advised patient to get tested for Covid --Refilled albuterol inhaler --Encouraged to take her Mucinex when it arrives --Advised to call clinic if symptoms worsen, do not improve over the next few days or she test positive for Covid.

## 2020-07-08 NOTE — Telephone Encounter (Signed)
Pls contact pt [phone]

## 2020-07-08 NOTE — Telephone Encounter (Signed)
Thanks for the update. Will call her.

## 2020-07-08 NOTE — Telephone Encounter (Signed)
TC to patient, VM obtained and [employer] compliant message left to call triage nurse back. SChaplin, RN,BSN

## 2020-07-08 NOTE — Progress Notes (Signed)
CC: Cough/Congestion  This is a telephone encounter between [name] and [name] on 07/08/2020 for a complaint of cough and congestion. The visit was conducted with the patient located at home and [name] at [facility]. The patient's identity was confirmed using their DOB and current address. The patient has consented to being evaluated through a telephone encounter and understands the associated risks (an examination cannot be done and the patient may need to come in for an appointment) / benefits (allows the patient to remain at home, decreasing exposure to coronavirus). I personally spent 20 minutes on medical discussion.   HPI:  [name] is a 69 y.o. with PMH as below.   Please see A&P for assessment of the patient's acute and chronic medical conditions.   Past Medical History:  Diagnosis Date  . ATN (acute tubular necrosis) ([place]) 07/15/2014  . Automatic implantable cardioverter-defibrillator in situ   . Automatic implantable cardioverter-defibrillator in situ 05/04/2007   Qualifier: Diagnosis of  By: Isla Pence    . Cardiac defibrillator in situ    Atlas II VR (SJM) implanted by Dr Lovena Le  . CHF (congestive heart failure) ([place])   . Chronic combined systolic and diastolic heart failure (HCC)    a. EF 35-40% in past;  b. Echo 7/13:  EF 45-50%, Gr 2 diast dysfn, mild AI, mild MAC, trivial MR, mild LAE, PASP 47.  [name] Chronic ulcer of leg ([place])    04-09-15 resolved-not a problem.  . Colon polyps 04/12/2013   Rectosigmoid polyp  . COPD (chronic obstructive pulmonary disease) (HCC)    O2 at night  . Depression   . Diabetes mellitus   . Diabetes mellitus, type 2 ([place]) 04/03/2012   HIGH RISK FEET.. Please have patient take shoes and socks off every visit for visual foot inspection.    . Eczema   . Elevated alkaline phosphatase level    GGT and 5'nucleotidase 8/13 normal  . Health care maintenance 01/22/2013   Surgically absent cervix- no pap needed (Path  report 07/2000: uterine body with attached bilateral adnexa and separate cervix.)  . History of oxygen administration    oxygen @ 2 l/m nasally bedtime 24/7  . HTN (hypertension)   . Hx of cardiac cath    a. [place] 2003 normal;  b. LHC 6/13:  Mild calcification in the LM, o/w normal coronary arteries, EF 45%.   . Hyperkalemia 08/08/2017  . Hyperlipidemia    Elevated triglyceride in 2019  . Hyperlipidemia associated with type 2 diabetes mellitus ([place]) 05/25/2007   Qualifier: Diagnosis of  By: Hassell Done FNP, Tori Milks    . Hyperthyroidism, subclinical   . HYPERTHYROIDISM, SUBCLINICAL 05/06/2009   Qualifier: Diagnosis of  By: Darrick Meigs    . Implantable cardioverter-defibrillator (ICD) generator end of life   . Lipoma   . Need for hepatitis C screening test 04/25/2019  . NICM (nonischemic cardiomyopathy) ([place])   . Obesity   . On home oxygen therapy    "2L; 24/7" (10/11/2016)  . Post menopausal syndrome   . Shortness of breath 07/14/2017  . Skin rash 07/12/2018  . Sleep apnea    pt denies 04/12/2013   Review of Systems:  Patient endorse associated loss of appetite and mild shortness of breath but denies any fever, chills, headaches, sore throat, chest pain, wheezing, nausea, vomiting or abdominal pain.    Assessment & Plan:   See Encounters Tab for problem based charting.  Patient discussed with Dr. Jimmye Norman  Linwood Dibbles, MD, MPH

## 2020-07-09 NOTE — Progress Notes (Signed)
DOS 07/08/20 Internal Medicine Clinic Attending  Case discussed with Dr. Coy Saunas  At the time of the visit and f/u call placed together.  I agree with the assessment, diagnosis, and plan of care documented in the resident's note.

## 2020-07-10 NOTE — Telephone Encounter (Signed)
Thanks

## 2020-07-10 NOTE — Telephone Encounter (Signed)
Patient called in to report negative covid test done yesterday. Hubbard Hartshorn, BSN, RN-BC

## 2020-07-11 ENCOUNTER — Other Ambulatory Visit: Payer: Self-pay | Admitting: Student

## 2020-07-11 DIAGNOSIS — E1142 Type 2 diabetes mellitus with diabetic polyneuropathy: Secondary | ICD-10-CM

## 2020-07-14 ENCOUNTER — Ambulatory Visit (INDEPENDENT_AMBULATORY_CARE_PROVIDER_SITE_OTHER): Payer: Medicare Other | Admitting: Student

## 2020-07-14 ENCOUNTER — Encounter: Payer: Self-pay | Admitting: Student

## 2020-07-14 ENCOUNTER — Other Ambulatory Visit: Payer: Self-pay

## 2020-07-14 ENCOUNTER — Ambulatory Visit (HOSPITAL_COMMUNITY)
Admission: RE | Admit: 2020-07-14 | Discharge: 2020-07-14 | Disposition: A | Discharge status: Home / Self Care | Payer: Medicare Other | Source: Ambulatory Visit | Attending: Internal Medicine | Admitting: Internal Medicine

## 2020-07-14 ENCOUNTER — Ambulatory Visit: Payer: Medicare Other

## 2020-07-14 VITALS — BP 146/63 | HR 80 | Temp 98.0°F | Ht 61.0 in | Wt 194.8 lb

## 2020-07-14 DIAGNOSIS — I517 Cardiomegaly: Secondary | ICD-10-CM | POA: Diagnosis not present

## 2020-07-14 DIAGNOSIS — I5042 Chronic combined systolic (congestive) and diastolic (congestive) heart failure: Secondary | ICD-10-CM

## 2020-07-14 DIAGNOSIS — J984 Other disorders of lung: Secondary | ICD-10-CM | POA: Diagnosis not present

## 2020-07-14 DIAGNOSIS — E1142 Type 2 diabetes mellitus with diabetic polyneuropathy: Secondary | ICD-10-CM

## 2020-07-14 DIAGNOSIS — R918 Other nonspecific abnormal finding of lung field: Secondary | ICD-10-CM | POA: Diagnosis not present

## 2020-07-14 DIAGNOSIS — I10 Essential (primary) hypertension: Secondary | ICD-10-CM

## 2020-07-14 DIAGNOSIS — I110 Hypertensive heart disease with heart failure: Secondary | ICD-10-CM

## 2020-07-14 DIAGNOSIS — R059 Cough, unspecified: Secondary | ICD-10-CM | POA: Insufficient documentation

## 2020-07-14 NOTE — Assessment & Plan Note (Signed)
Patient assessed via telephone. She reports cough has not improved since a week now. Now has had cough for 2 weeks producing light yellow sputum. Cough is more frequent than before and shortness of breath has progressed to having difficulty walking to the front door. For the past 3 days she also endorses mild dull non radiating left sided chest pain lasting a few minutes worsened with activity. She denies fever, chills, headaches, nausea, vomiting, worsening leg edema, lightheadedness or dizziness. She has been using albuterol twice daily and mucinex with temporarily improves symptoms but continues to feel poorly. She did get a COVID test after her last visit which was negative. She is on 2L O2 at night, does not check her o2 saturations at home and has not tried increasing her O2 usage. Given that her cough has no improved in the past week and negative COVID test she would benefit from in person evaluation given history of hospital admissions for CHF exacerbation and hypoxic  respiratory failure. Discussed this with patient who is able to be seen in clinic this afternoon.   Plan -In person clinic visit this afternoon or tomorrow morning.

## 2020-07-14 NOTE — Progress Notes (Signed)
   CC: cough  This is a telephone encounter between ISABELE LOLLAR and Iona Beard on 07/14/2020 for 2 week history of cough. The visit was conducted with the patient located at home and Iona Beard at [facility]. The patient's identity was confirmed using their DOB and current address. The patient has consented to being evaluated through a telephone encounter and understands the associated risks (an examination cannot be done and the patient may need to come in for an appointment) / benefits (allows the patient to remain at home, decreasing exposure to coronavirus). I personally spent 20 minutes on medical discussion.   HPI:  [name] is a 69 y.o. with PMH as below.   Please see A&P for assessment of the patient's acute and chronic medical conditions.   Past Medical History:  Diagnosis Date  . ATN (acute tubular necrosis) ([place]) 07/15/2014  . Automatic implantable cardioverter-defibrillator in situ   . Automatic implantable cardioverter-defibrillator in situ 05/04/2007   Qualifier: Diagnosis of  By: Isla Pence    . Cardiac defibrillator in situ    Atlas II VR (SJM) implanted by Dr Lovena Le  . CHF (congestive heart failure) ([place])   . Chronic combined systolic and diastolic heart failure (HCC)    a. EF 35-40% in past;  b. Echo 7/13:  EF 45-50%, Gr 2 diast dysfn, mild AI, mild MAC, trivial MR, mild LAE, PASP 47.  [name] Chronic ulcer of leg ([place])    04-09-15 resolved-not a problem.  . Colon polyps 04/12/2013   Rectosigmoid polyp  . COPD (chronic obstructive pulmonary disease) (HCC)    O2 at night  . Depression   . Diabetes mellitus   . Diabetes mellitus, type 2 ([place]) 04/03/2012   HIGH RISK FEET.. Please have patient take shoes and socks off every visit for visual foot inspection.    . Eczema   . Elevated alkaline phosphatase level    GGT and 5'nucleotidase 8/13 normal  . Health care maintenance 01/22/2013   Surgically absent cervix- no pap needed (Path report 07/2000: uterine body with  attached bilateral adnexa and separate cervix.)  . History of oxygen administration    oxygen @ 2 l/m nasally bedtime 24/7  . HTN (hypertension)   . Hx of cardiac cath    a. [place] 2003 normal;  b. LHC 6/13:  Mild calcification in the LM, o/w normal coronary arteries, EF 45%.   . Hyperkalemia 08/08/2017  . Hyperlipidemia    Elevated triglyceride in 2019  . Hyperlipidemia associated with type 2 diabetes mellitus ([place]) 05/25/2007   Qualifier: Diagnosis of  By: Hassell Done FNP, Tori Milks    . Hyperthyroidism, subclinical   . HYPERTHYROIDISM, SUBCLINICAL 05/06/2009   Qualifier: Diagnosis of  By: Darrick Meigs    . Implantable cardioverter-defibrillator (ICD) generator end of life   . Lipoma   . Need for hepatitis C screening test 04/25/2019  . NICM (nonischemic cardiomyopathy) ([place])   . Obesity   . On home oxygen therapy    "2L; 24/7" (10/11/2016)  . Post menopausal syndrome   . Shortness of breath 07/14/2017  . Skin rash 07/12/2018  . Sleep apnea    pt denies 04/12/2013   Review of Systems:  Patient with continued cough, shortness of breath, congestion, and chest pain. Denies fever, chills, headaches, sore throat. Wheezing, nausea vomiting, dizziness and lightheadedness.   Assessment & Plan:   See Encounters Tab for problem based charting.  Patient discussed with Dr. Jimmye Norman

## 2020-07-14 NOTE — Assessment & Plan Note (Addendum)
Patient with PFTs concerning for restrictive process in January 2020 and also anoro ellipta and albuterol was referred  to pulmonologist earlier this year but has not made an appointment. She has not been using her home O2 as well. Advised patient to make an appointment when she is feeling better.  Plan  Continue albuterol and Anoro ellipta Referral of Pulmonology

## 2020-07-14 NOTE — Assessment & Plan Note (Addendum)
Patient evaluated in clinic today for cough for the past 2 weeks which has not improved with conservative management with albuterol and guaifenesin. Patient also has increases shortness breath with exertion and sharp chest wall pain with her cough. She denies fever, chills, diaphoresis, dizziness, and lightheadedness. On exam she is afebrile, BP up to 171 initially but improved to 146/63 on repeat. Mildly diminished lung sounds but otherwise CTA. O2 sat 100% while sitting and 98% with ambulation. Does not appear to be in respiratory distress abut to converse in full sentences, but has an occasional wheezing dry cough during visit. Her outside [place] test was negative since her last televisit 1 week ago. Her cough appears to be due to viral URI however CHF and underlying lung disease may also be contributing to her symptoms.  Plan: - Continue albuterol, Guaifenesin, robitussin DM as needed - Follow up CXR  - Advised to call clinic if symptoms worsen, do not improve over the next few days   Addendum:  - Chest xray without pulmonary edema with minimal bibasilar opacities. Cough likely secondary to acute viral bronchitis. Will not increase her diuretics and hold off on antibiotics for now.

## 2020-07-14 NOTE — Patient Instructions (Signed)
Visit Information  Goals Addressed              This Visit's Progress   .  "Can you please send me a list of rental properties" (pt-stated)        [place] (see longitudinal plan of care for additional care plan information)  Current Barriers:  . Housing barriers Patient contacted [facility] requesting list of rental properties to be mailed to her.  Patient did not wish to share reason for relocation .   Clinical Social Work Clinical Goal(s):  [name] Over the next 180 days, patient will work with SW to address concerns related to housing  Interventions: . Inter-disciplinary care team collaboration (see longitudinal plan of care) . Inquired about patient's current housing situation and reason for seeking relocation; patient did not wish to discuss. . Mailed patient a list of senior living properties and rental properties from [email or web address] . Informed patient that most, if not all, senior and/or low income properties will have wait list.  Encouraged patient to be added to wait lists for any properties she is interested in.   Patient Self Care Activities:   . Patient is unable to independently self-manage chronic health conditions  Initial goal documentation        Patient verbalizes understanding of instructions provided today.   Telephone follow up appointment with care management team member scheduled for:07/28/20   Ronn Melena, [place] Coordination Social Worker [place] [phone]

## 2020-07-14 NOTE — Progress Notes (Signed)
CC: Cough  HPI:  [name] is a 69 y.o. female with history below presents for 2 weeks of cough initially seen this morning for a telehealth visit. Please refer to problem based charting for further details and assessment and plan of current problem and chronic medical conditions.   Past Medical History:  Diagnosis Date  . ATN (acute tubular necrosis) ([place]) 07/15/2014  . Automatic implantable cardioverter-defibrillator in situ   . Automatic implantable cardioverter-defibrillator in situ 05/04/2007   Qualifier: Diagnosis of  By: Isla Pence    . Cardiac defibrillator in situ    Atlas II VR (SJM) implanted by Dr Lovena Le  . CHF (congestive heart failure) ([place])   . Chronic combined systolic and diastolic heart failure (HCC)    a. EF 35-40% in past;  b. Echo 7/13:  EF 45-50%, Gr 2 diast dysfn, mild AI, mild MAC, trivial MR, mild LAE, PASP 47.  [name] Chronic ulcer of leg ([place])    04-09-15 resolved-not a problem.  . Colon polyps 04/12/2013   Rectosigmoid polyp  . COPD (chronic obstructive pulmonary disease) (HCC)    O2 at night  . Depression   . Diabetes mellitus   . Diabetes mellitus, type 2 ([place]) 04/03/2012   HIGH RISK FEET.. Please have patient take shoes and socks off every visit for visual foot inspection.    . Eczema   . Elevated alkaline phosphatase level    GGT and 5'nucleotidase 8/13 normal  . Health care maintenance 01/22/2013   Surgically absent cervix- no pap needed (Path report 07/2000: uterine body with attached bilateral adnexa and separate cervix.)  . History of oxygen administration    oxygen @ 2 l/m nasally bedtime 24/7  . HTN (hypertension)   . Hx of cardiac cath    a. [place] 2003 normal;  b. LHC 6/13:  Mild calcification in the LM, o/w normal coronary arteries, EF 45%.   . Hyperkalemia 08/08/2017  . Hyperlipidemia    Elevated triglyceride in 2019  . Hyperlipidemia associated with type 2 diabetes mellitus ([place]) 05/25/2007   Qualifier: Diagnosis of  By: Hassell Done FNP,  Tori Milks    . Hyperthyroidism, subclinical   . HYPERTHYROIDISM, SUBCLINICAL 05/06/2009   Qualifier: Diagnosis of  By: Darrick Meigs    . Implantable cardioverter-defibrillator (ICD) generator end of life   . Lipoma   . Need for hepatitis C screening test 04/25/2019  . NICM (nonischemic cardiomyopathy) ([place])   . Obesity   . On home oxygen therapy    "2L; 24/7" (10/11/2016)  . Post menopausal syndrome   . Shortness of breath 07/14/2017  . Skin rash 07/12/2018  . Sleep apnea    pt denies 04/12/2013   Review of Systems:  Negative as per HPI  Physical Exam:  Vitals:   07/14/20 1403 07/14/20 1411  BP: (!) 171/87 (!) 146/63  Pulse: 90 80  Temp: 98 F (36.7 C)   TempSrc: Oral   SpO2: 100%   Weight: 194 lb 12.8 oz (88.4 kg)   Height: _0  (1.549 m)    Physical Exam Constitutional:      General: She is not in acute distress.    Appearance: Normal appearance.  Cardiovascular:     Rate and Rhythm: Normal rate and regular rhythm.     Pulses: Normal pulses.     Heart sounds: Normal heart sounds. No murmur heard.      Comments: No JVP, mild bilateral edema, mild edema of both hands Pulmonary:     Effort:  Pulmonary effort is normal. No respiratory distress.     Breath sounds: No rhonchi or rales.     Comments: Diminished breath sound bilaterally, able to speak in full sentences, Wheezing dry cough present Abdominal:     General: Abdomen is flat.     Palpations: Abdomen is soft.  Skin:    Capillary Refill: Capillary refill takes less than 2 seconds.  Neurological:     Mental Status: She is alert.      Assessment & Plan:   See Encounters Tab for problem based charting.  Patient seen with Dr. Jimmye Norman

## 2020-07-14 NOTE — Progress Notes (Signed)
Internal Medicine Clinic Resident  I have personally reviewed this encounter including the documentation in this note and/or discussed this patient with the care management provider. I will address any urgent items identified by the care management provider and will communicate my actions to the patient's PCP. I have reviewed the patient's CCM visit with my supervising attending, Dr Rebeca Alert.  Delice Bison, DO 07/14/2020

## 2020-07-14 NOTE — Patient Instructions (Addendum)
It was a pleasure seeing you in clinic. Today we discussed:   Cough: Your cough is likely due to viral and will take time to get better. You can continue with the albuterol, mucinex and robitussin DM as needed to cough and mucous. Please let use know if you develop fever worsening symptoms or are not getting better.   Heart failure: You cough may also be worsening due to extra fluid in you lung due to heart failure. Please have an chest x ray at [facility].  I have I have also ordered some blood tests and will call you with the results abnormal and let you know if we need to adjust any of you medications.  Lung disease: I have made a referral to pulmonary. When you are feeling better please make an appointment with the lung doctors.  Hope you feel better soon!  Iona Beard, MD

## 2020-07-14 NOTE — Chronic Care Management (AMB) (Signed)
  Care Management   Follow Up Note   07/14/2020 Name: [name] MRN: [id number] DOB: [birth year]  Referred by: Maudie Mercury, MD Reason for referral : Care Coordination (housing)   RYLA CAUTHON is a 69 y.o. year old female who is a primary care patient of Maudie Mercury, MD. The care management team was consulted for assistance with care management and care coordination needs.    Review of patient status, including review of consultants reports, relevant laboratory and other test results, and collaboration with appropriate care team members and the patient's provider was performed as part of comprehensive patient evaluation and provision of chronic care management services.    SDOH (Social Determinants of Health) assessments performed: Yes See Care Plan activities for detailed interventions related to SDOH)  SDOH Interventions     Most Recent Value  SDOH Interventions  Housing Interventions Other (Comment)  [patient called [facility] requesting housing resources but would not share reason for seeking new housing.  mailed list of senior and low income properties]       Advanced Directives: See Care Plan and Vynca application for related entries.   Goals Addressed              This Visit's Progress   .  "Can you please send me a list of rental properties" (pt-stated)        [place] (see longitudinal plan of care for additional care plan information)  Current Barriers:  . Housing barriers Patient contacted [facility] requesting list of rental properties to be mailed to her.  Patient did not wish to share reason for relocation .   Clinical Social Work Clinical Goal(s):  [name] Over the next 180 days, patient will work with SW to address concerns related to housing  Interventions: . Inter-disciplinary care team collaboration (see longitudinal plan of care) . Inquired about patient's current housing situation and reason for seeking relocation; patient did not wish to discuss. .  Mailed patient a list of senior living properties and rental properties from [email or web address] . Informed patient that most, if not all, senior and/or low income properties will have wait list.  Encouraged patient to be added to wait lists for any properties she is interested in.   Patient Self Care Activities:   . Patient is unable to independently self-manage chronic health conditions  Initial goal documentation         Telephone follow up appointment with care management team member scheduled for:07/28/20     Ronn Melena, [place] Coordination Social Worker [place] [phone]

## 2020-07-15 LAB — BMP8+ANION GAP
Anion Gap: 17 mmol/L (ref 10.0–18.0)
BUN/Creatinine Ratio: 22 (ref 12–28)
BUN: 20 mg/dL (ref 8–27)
CO2: 20 mmol/L (ref 20–29)
Calcium: 9.5 mg/dL (ref 8.7–10.3)
Chloride: 101 mmol/L (ref 96–106)
Creatinine, Ser: 0.89 mg/dL (ref 0.57–1.00)
GFR calc Af Amer: 78 mL/min/{1.73_m2} (ref >59)
GFR calc non Af Amer: 68 mL/min/{1.73_m2} (ref >59)
Glucose: 423 mg/dL — ABNORMAL HIGH (ref 65–99)
Potassium: 4.8 mmol/L (ref 3.5–5.2)
Sodium: 138 mmol/L (ref 134–144)

## 2020-07-15 NOTE — Assessment & Plan Note (Addendum)
Patient complaining of worsened shortness of breath and increase swelling in both legs and hands. Currently taking Lasix 40 mg twice a day. Appears to have gained 4 lbs since the last visit. No JVD, lungs CTA does not appear significantly volume overloaded, but has been taking a lower dose of Lasix. She was not aware she was increased to 60 mg twice a day.   Plan -BMP today -CXR to check for pulmonary edema - Lasix 4 0mg  twice daily, consider increase lasix to 60 mg twice a day if CXR is volume overloaded  Addendum:  BMP significant for elevated glucose to 428 will have her follow up in 2 months for diabetes.

## 2020-07-15 NOTE — Progress Notes (Signed)
Internal Medicine Clinic Attending  CCM services provided by the care management provider and their documentation were reviewed with Dr. Koleen Distance.  We reviewed the pertinent findings, urgent action items addressed by the resident and non-urgent items to be addressed by the PCP.  I agree with the assessment, diagnosis, and plan of care documented in the CCM and resident's note.  Oda Kilts, MD 07/15/2020

## 2020-07-16 ENCOUNTER — Telehealth: Payer: Self-pay | Admitting: Student

## 2020-07-16 NOTE — Telephone Encounter (Signed)
Called patient to update on CXR and lab results. Left message for to call back.

## 2020-07-17 ENCOUNTER — Telehealth: Payer: Self-pay | Admitting: Student

## 2020-07-17 NOTE — Progress Notes (Signed)
Internal Medicine Clinic Attending  I saw and evaluated the patient.  I personally confirmed the key portions of the history and exam documented by Dr. Liang and I reviewed pertinent patient test results.  The assessment, diagnosis, and plan were formulated together and I agree with the documentation in the resident's note.  

## 2020-07-17 NOTE — Telephone Encounter (Signed)
Called patient to discuss labs and cxr findings. She states she is feeling better and her cough has improved. Let he know here glucose is elevated and that she should come in for diabetes follow up in the next 2 weeks which she is agreeable.

## 2020-07-23 DIAGNOSIS — L2084 Intrinsic (allergic) eczema: Secondary | ICD-10-CM | POA: Diagnosis not present

## 2020-07-27 ENCOUNTER — Ambulatory Visit (INDEPENDENT_AMBULATORY_CARE_PROVIDER_SITE_OTHER): Payer: Medicare Other | Admitting: Internal Medicine

## 2020-07-27 VITALS — BP 161/84 | HR 86 | Wt 194.4 lb

## 2020-07-27 DIAGNOSIS — Z0000 Encounter for general adult medical examination without abnormal findings: Secondary | ICD-10-CM

## 2020-07-27 DIAGNOSIS — E1142 Type 2 diabetes mellitus with diabetic polyneuropathy: Secondary | ICD-10-CM | POA: Diagnosis not present

## 2020-07-27 DIAGNOSIS — F17200 Nicotine dependence, unspecified, uncomplicated: Secondary | ICD-10-CM

## 2020-07-27 DIAGNOSIS — E781 Pure hyperglyceridemia: Secondary | ICD-10-CM

## 2020-07-27 DIAGNOSIS — Z23 Encounter for immunization: Secondary | ICD-10-CM

## 2020-07-27 DIAGNOSIS — I10 Essential (primary) hypertension: Secondary | ICD-10-CM

## 2020-07-27 DIAGNOSIS — I5042 Chronic combined systolic (congestive) and diastolic (congestive) heart failure: Secondary | ICD-10-CM

## 2020-07-27 DIAGNOSIS — K760 Fatty (change of) liver, not elsewhere classified: Secondary | ICD-10-CM

## 2020-07-27 LAB — POCT GLYCOSYLATED HEMOGLOBIN (HGB A1C): Hemoglobin A1C: 9.1 % — AB (ref 4.0–5.6)

## 2020-07-27 LAB — GLUCOSE, CAPILLARY: Glucose-Capillary: 115 mg/dL — ABNORMAL HIGH (ref 70–99)

## 2020-07-27 NOTE — Patient Instructions (Addendum)
Thank you, [name] for allowing [place] to provide your care today. Today we discussed Diabetes Mellitus.    I have ordered the following labs for you:   Lab Orders     Glucose, capillary     POC Hbg A1C   Tests ordered today:  none  Referrals ordered today:   Referral Orders  No referral(s) requested today     I have ordered the following medication/changed the following medications:   Stop the following medications: There are no discontinued medications.   Start the following medications: 1) SGLT-2 inhibitor.  2) Increase Losartan 50 mg.  Follow up: 2-3 months with Dr. Gilford Rile, @ weeks for blood pressure check  Remember:   Should you have any questions or concerns please call the internal medicine clinic at [phone].     Marianna Payment, D.O. [place]

## 2020-07-27 NOTE — Progress Notes (Signed)
CC: DM  HPI:  [name] is a 69 y.o. female with a past medical history stated below and presents today for DM. Please see problem based assessment and plan for additional details.  Past Medical History:  Diagnosis Date  . ATN (acute tubular necrosis) ([place]) 07/15/2014  . Automatic implantable cardioverter-defibrillator in situ   . Automatic implantable cardioverter-defibrillator in situ 05/04/2007   Qualifier: Diagnosis of  By: Isla Pence    . Cardiac defibrillator in situ    Atlas II VR (SJM) implanted by Dr Lovena Le  . CHF (congestive heart failure) ([place])   . Chronic combined systolic and diastolic heart failure (HCC)    a. EF 35-40% in past;  b. Echo 7/13:  EF 45-50%, Gr 2 diast dysfn, mild AI, mild MAC, trivial MR, mild LAE, PASP 47.  [name] Chronic ulcer of leg ([place])    04-09-15 resolved-not a problem.  . Colon polyps 04/12/2013   Rectosigmoid polyp  . COPD (chronic obstructive pulmonary disease) (HCC)    O2 at night  . Depression   . Diabetes mellitus   . Diabetes mellitus, type 2 ([place]) 04/03/2012   HIGH RISK FEET.. Please have patient take shoes and socks off every visit for visual foot inspection.    . Eczema   . Elevated alkaline phosphatase level    GGT and 5'nucleotidase 8/13 normal  . Health care maintenance 01/22/2013   Surgically absent cervix- no pap needed (Path report 07/2000: uterine body with attached bilateral adnexa and separate cervix.)  . History of oxygen administration    oxygen @ 2 l/m nasally bedtime 24/7  . HTN (hypertension)   . Hx of cardiac cath    a. [place] 2003 normal;  b. [facility] 6/13:  Mild calcification in the LM, o/w normal coronary arteries, EF 45%.   . Hyperkalemia 08/08/2017  . Hyperlipidemia    Elevated triglyceride in 2019  . Hyperlipidemia associated with type 2 diabetes mellitus ([place]) 05/25/2007   Qualifier: Diagnosis of  By: Hassell Done FNP, Tori Milks    . Hyperthyroidism, subclinical   . HYPERTHYROIDISM, SUBCLINICAL 05/06/2009   Qualifier:  Diagnosis of  By: Darrick Meigs    . Implantable cardioverter-defibrillator (ICD) generator end of life   . Lipoma   . Need for hepatitis C screening test 04/25/2019  . NICM (nonischemic cardiomyopathy) ([place])   . Obesity   . On home oxygen therapy    "2L; 24/7" (10/11/2016)  . Post menopausal syndrome   . Shortness of breath 07/14/2017  . Skin rash 07/12/2018  . Sleep apnea    pt denies 04/12/2013    Current Outpatient Medications on File Prior to Visit  Medication Sig Dispense Refill  . albuterol (VENTOLIN HFA) 108 (90 Base) MCG/ACT inhaler Inhale 2 puffs into the lungs every 6 (six) hours as needed for wheezing or shortness of breath. 8 g 2  . aspirin EC 81 MG tablet Take 1 tablet (81 mg total) by mouth 2 (two) times daily. 84 tablet 0  . atorvastatin (LIPITOR) 80 MG tablet Take 1 tablet (80 mg total) by mouth at bedtime. 90 tablet 3  . Blood Glucose Monitoring Suppl (CONTOUR NEXT MONITOR) w/Device KIT 1 Act by Does not apply route 4 (four) times daily. 1 kit 0  . calcium-vitamin D (OSCAL WITH D) 500-200 MG-UNIT tablet Take 1 tablet by mouth 2 (two) times daily. 90 tablet 6  . clobetasol ointment (TEMOVATE) 9.98 % Apply 1 application topically 2 (two) times daily as needed (Skin rash).     [name]  furosemide (LASIX) 20 MG tablet TAKE 3 TABLETS(60 MG) BY MOUTH TWICE DAILY 540 tablet 1  . glucose blood (CONTOUR NEXT TEST) test strip Use as instructed 200 each 2  . Insulin Pen Needle (PEN NEEDLES) 32G X 4 MM MISC Use to inject insulin once a day 100 each 3  . Lancets Misc. (ACCU-CHEK FASTCLIX LANCET) KIT Lancing device, use with lancets for blood glucose monitoring. 1 kit 0  . Lancets MISC 1 Act by Does not apply route 4 (four) times daily. 200 each 3  . liraglutide (VICTOZA) 18 MG/3ML SOPN Inject 1.37m daily into the skin (Patient taking differently: Inject 1.8 mg into the skin daily. Inject 1.877mdaily into the skin) 30 mL 3  . losartan (COZAAR) 25 MG tablet TAKE 1 TABLET(25 MG) BY MOUTH DAILY  30 tablet 1  . metoprolol succinate (TOPROL-XL) 50 MG 24 hr tablet Take 2 tablets (100 mg total) by mouth daily. 180 tablet 3  . nitroGLYCERIN (NITROSTAT) 0.4 MG SL tablet Place 1 tablet (0.4 mg total) under the tongue every 5 (five) minutes as needed for chest pain. Reported on 03/18/2016 25 tablet 3  . spironolactone (ALDACTONE) 25 MG tablet TAKE 0.5 TABLETS BY MOUTH EVERY DAY (Patient taking differently: Take 12.5 mg by mouth daily. ) 45 tablet 3  . TRESIBA FLEXTOUCH 100 UNIT/ML FlexTouch Pen INJECT 20 UNITS INTO THE SKIN DAILY 15 mL 0  . umeclidinium-vilanterol (ANORO ELLIPTA) 62.5-25 MCG/INH AEPB Inhale 1 puff into the lungs daily. 1 each 3   No current facility-administered medications on file prior to visit.    Family History  Problem Relation Age of Onset  . Stroke Mother   . Seizures Father   . Heart disease Father   . Diabetes Sister   . Asthma Maternal Aunt        aunts  . Asthma Maternal Uncle        uncles  . Heart disease Paternal Aunt        aunts  . Heart disease Paternal Uncle        uncles  . Heart disease Maternal Aunt        aunts  . Heart disease Maternal Uncle        uncles  . Heart disease Maternal Grandfather   . Breast cancer Daughter   . Breast cancer Cousin   . Colon cancer Neg Hx   . Colon polyps Neg Hx   . Esophageal cancer Neg Hx   . Kidney disease Neg Hx   . Gallbladder disease Neg Hx     Social History   Socioeconomic History  . Marital status: Widowed    Spouse name: MiAlroy Dust. Number of children: 3  . Years of education: 1118. Highest education level: Not on file  Occupational History  . Occupation: disabled  Tobacco Use  . Smoking status: Current Every Day Smoker    Packs/day: 0.15    Years: 40.00    Pack years: 6.00    Types: Cigarettes  . Smokeless tobacco: Never Used  . Tobacco comment: stopped last week   Vaping Use  . Vaping Use: Never used  Substance and Sexual Activity  . Alcohol use: No    Alcohol/week: 1.0  standard drink    Types: 1 Glasses of wine per week  . Drug use: No  . Sexual activity: Not Currently  Other Topics Concern  . Not on file  Social History Narrative   ** Merged History Encounter **  Married   Patient enjoys going to [employer], church and spending time with family   Social Determinants of Health   Financial Resource Strain: High Risk  . Difficulty of Paying Living Expenses: Hard  Food Insecurity: No Food Insecurity  . Worried About Charity fundraiser in the Last Year: Never true  . Ran Out of Food in the Last Year: Never true  Transportation Needs:   . Lack of Transportation (Medical): Not on file  . Lack of Transportation (Non-Medical): Not on file  Physical Activity:   . Days of Exercise per Week: Not on file  . Minutes of Exercise per Session: Not on file  Stress:   . Feeling of Stress : Not on file  Social Connections:   . Frequency of Communication with Friends and Family: Not on file  . Frequency of Social Gatherings with Friends and Family: Not on file  . Attends Religious Services: Not on file  . Active Member of Clubs or Organizations: Not on file  . Attends Archivist Meetings: Not on file  . Marital Status: Not on file  Intimate Partner Violence:   . Fear of Current or Ex-Partner: Not on file  . Emotionally Abused: Not on file  . Physically Abused: Not on file  . Sexually Abused: Not on file    Review of Systems: ROS negative except for what is noted on the assessment and plan.  Vitals:   07/27/20 0936  BP: (!) 161/84  Pulse: 86  SpO2: 100%  Weight: 194 lb 6.4 oz (88.2 kg)     Physical Exam: Physical Exam Constitutional:      Appearance: Normal appearance.  HENT:     Head: Normocephalic and atraumatic.  Eyes:     Extraocular Movements: Extraocular movements intact.  Cardiovascular:     Rate and Rhythm: Normal rate.     Pulses: Normal pulses.     Heart sounds: Normal heart sounds.  Pulmonary:     Effort:  Pulmonary effort is normal.     Breath sounds: Normal breath sounds.  Musculoskeletal:        General: Normal range of motion.     Cervical back: Normal range of motion.     Right lower leg: No edema.     Left lower leg: No edema.  Skin:    General: Skin is warm and dry.  Neurological:     Mental Status: She is alert and oriented to person, place, and time. Mental status is at baseline.  Psychiatric:        Mood and Affect: Mood normal.      Assessment & Plan:   See Encounters Tab for problem based charting.  Patient discussed with Dr. Bertell Maria, D.O. [facility], PGY-2 Pager: [phone], Phone: [phone] Date 07/28/2020 Time 7:08 AM

## 2020-07-28 ENCOUNTER — Encounter: Payer: Self-pay | Admitting: Internal Medicine

## 2020-07-28 ENCOUNTER — Ambulatory Visit: Payer: Medicare Other

## 2020-07-28 ENCOUNTER — Ambulatory Visit: Payer: Medicare Other | Attending: Family

## 2020-07-28 DIAGNOSIS — Z23 Encounter for immunization: Secondary | ICD-10-CM

## 2020-07-28 DIAGNOSIS — I10 Essential (primary) hypertension: Secondary | ICD-10-CM

## 2020-07-28 DIAGNOSIS — K760 Fatty (change of) liver, not elsewhere classified: Secondary | ICD-10-CM | POA: Insufficient documentation

## 2020-07-28 DIAGNOSIS — E0942 Drug or chemical induced diabetes mellitus with neurological complications with diabetic polyneuropathy: Secondary | ICD-10-CM

## 2020-07-28 DIAGNOSIS — I5042 Chronic combined systolic (congestive) and diastolic (congestive) heart failure: Secondary | ICD-10-CM

## 2020-07-28 MED ORDER — DAPAGLIFLOZIN PROPANEDIOL 5 MG PO TABS: 5.0000 mg | ORAL_TABLET | Freq: Every day | ORAL | 2 refills | Status: DC

## 2020-07-28 NOTE — Assessment & Plan Note (Addendum)
Patient has history of hyperlipidemia with significantly elevated total cholesterol and LDL.  She is on atorvastatin 80 mg for elevated ASCVD.  She is also on aspirin 81 mg.  She has imaging showing signs of atherosclerosis and therefore likely would benefit from aspirin for secondary prevention of CAD/PAD.  Plan: -Continue atorvastatin 80 mg and ASA 81 mg

## 2020-07-28 NOTE — Patient Instructions (Signed)
Visit Information  Goals Addressed              This Visit's Progress   .  "Can you please send me a list of rental properties" (pt-stated)        [place] (see longitudinal plan of care for additional care plan information)  Current Barriers:  . Housing barriers Patient contacted [facility] requesting list of rental properties to be mailed to her.  Patient did not wish to share reason for relocation .   Clinical Social Work Clinical Goal(s):  [name] Over the next 180 days, patient will work with SW to address concerns related to housing  Interventions: . Inter-disciplinary care team collaboration (see longitudinal plan of care) . Ensured that patient received requested list of rental properties . Inquired if patient needs additional housing assistance at this time. . Closing goal of care plan as patient declined additional social work support  Patient Self Care Activities:   . Patient is unable to independently self-manage chronic health conditions  Please see past updates related to this goal by clicking on the "Past Updates" button in the selected goal         Patient verbalizes understanding of instructions provided today.   The patient has been provided with contact information for the care management team and has been advised to call with any health related questions or concerns.     Ronn Melena, [place] Coordination Social Worker [place] [phone]

## 2020-07-28 NOTE — Progress Notes (Signed)
Internal Medicine Clinic Resident  I have personally reviewed this encounter including the documentation in this note and/or discussed this patient with the care management provider. I will address any urgent items identified by the care management provider and will communicate my actions to the patient's PCP. I have reviewed the patient's CCM visit with my supervising attending, Dr Mullen.  Jonatan Wilsey K Selmer Adduci, MD 07/28/2020   

## 2020-07-28 NOTE — Assessment & Plan Note (Addendum)
Patient presents today for reevaluation of her hypertension.  Patient is hypertensive at 161/84 on losartan 25 mg, spironolactone 25 mg, metoprolol 50 mg, and furosemide 40 mg twice daily.  Patient has not had no formal evaluation for resistant hypertension.  Although, she did have a CT abdomen in 2018 that was within normal limits and a TSH in 2017 that was 0.603.  I do believe she has any emergent need for evaluating for renal artery stenosis as she would need to be off spironolactone for roughly 6 weeks prior to being evaluated.  Therefore I will titrate her losartan up today to get better control of her blood pressure.  Plan: -Increase losartan to 50 mg daily. -BMP today - Follow up in 2 weeks to recheck blood pressure and recheck BMP

## 2020-07-28 NOTE — Assessment & Plan Note (Deleted)
Patient presents for reevaluation of diabetes her A1c was 10.7 and now decreased to 9.1 today.  Random blood sugar was 115.  Patient is taking [name] 22 units daily and liraglutide 1.5 mg daily.  She denies any medication side effects.  She denies any signs or symptoms of hypo- or hyperglycemia.  Patient states that she has an eye exam in November with Dr. Franne Grip.  Will perform foot exam today.  Plan: -Continue current medication management for diabetes and reevaluate in 3 months.

## 2020-07-28 NOTE — Chronic Care Management (AMB) (Signed)
  Care Management   Follow Up Note   07/28/2020 Name: [name] MRN: [id number] DOB: 1951/05/14  Referred by: Maudie Mercury, MD Reason for referral : Care Coordination (housing)   [name] is a 69 y.o. year old female who is a primary care patient of Maudie Mercury, MD. The care management team was consulted for assistance with care management and care coordination needs.    Review of patient status, including review of consultants reports, relevant laboratory and other test results, and collaboration with appropriate care team members and the patient's provider was performed as part of comprehensive patient evaluation and provision of chronic care management services.    SDOH (Social Determinants of Health) assessments performed: No See Care Plan activities for detailed interventions related to [facility])     Advanced Directives: See Care Plan and Vynca application for related entries.   Goals Addressed              This Visit's Progress   .  "Can you please send me a list of rental properties" (pt-stated)        [place] (see longitudinal plan of care for additional care plan information)  Current Barriers:  . Housing barriers Patient contacted [facility] requesting list of rental properties to be mailed to her.  Patient did not wish to share reason for relocation .   Clinical Social Work Clinical Goal(s):  [name] Over the next 180 days, patient will work with SW to address concerns related to housing  Interventions: . Inter-disciplinary care team collaboration (see longitudinal plan of care) . Ensured that patient received requested list of rental properties . Inquired if patient needs additional housing assistance at this time. . Closing goal of care plan as patient declined additional social work support  Patient Self Care Activities:   . Patient is unable to independently self-manage chronic health conditions  Please see past updates related to this goal by  clicking on the "Past Updates" button in the selected goal          The patient has been provided with contact information for the care management team and has been advised to call with any health related questions or concerns.     Ronn Melena, [place] Coordination Social Worker [place] [phone]

## 2020-07-28 NOTE — Assessment & Plan Note (Signed)
Patient had a CT scan of her abdomen back in 2018 that showed signs of early cirrhosis.  Patient will need to be reevaluated in the near future for cirrhosis and the need for GI referral for upper endoscopy.  On evaluation today, patient does not have any signs or symptoms of decompensated cirrhosis.

## 2020-07-28 NOTE — Addendum Note (Signed)
Addended by: Lawerance Cruel on: 07/28/2020 08:39 AM   Modules accepted: Orders

## 2020-07-28 NOTE — Assessment & Plan Note (Signed)
Patient has a history of tobacco use disorder.  Patient states that she has tried to quit cold [place].  She has been off smoking for the last 2 weeks and states that she is doing fairly well.  I offered her medication management, nicotine replacement, and counseling.  Patient states that she will consider that in the future.

## 2020-07-28 NOTE — Assessment & Plan Note (Addendum)
Patient presents for reevaluation of diabetes her A1c was 10.7 and now decreased to 9.1 today.  Random blood sugar was 115.  Patient is taking [name] 22 units daily and liraglutide 1.5 mg daily.  She denies any medication side effects.  She denies any signs or symptoms of hypo- or hyperglycemia.  Patient states that she has an eye exam in November with Dr. Franne Grip.  Will perform foot exam today.  Patient has a significant history of cardiovascular disease including heart failure with severely reduced ejection fraction.  Therefore she would likely benefit from starting dapagliflozin 5 mg once daily and titrate up as needed.  Plan: -Continue current medication management for diabetes and reevaluate in 3 months. - Start the Depagliflozin 5 mg daily.

## 2020-07-28 NOTE — Assessment & Plan Note (Signed)
Flu shot given today

## 2020-07-29 ENCOUNTER — Telehealth: Payer: Self-pay | Admitting: Internal Medicine

## 2020-07-29 ENCOUNTER — Telehealth: Payer: Medicare Other

## 2020-07-29 ENCOUNTER — Telehealth: Payer: Self-pay | Admitting: *Deleted

## 2020-07-29 DIAGNOSIS — I509 Heart failure, unspecified: Secondary | ICD-10-CM | POA: Diagnosis not present

## 2020-07-29 DIAGNOSIS — J449 Chronic obstructive pulmonary disease, unspecified: Secondary | ICD-10-CM | POA: Diagnosis not present

## 2020-07-29 NOTE — Telephone Encounter (Signed)
I called this child regarding her most recent office visit.  I want to check on her blood glucose readings and make sure she is able to afford her new prescription for SGLT2 inhibitor.  I also on make sure she remember that we increased her losartan to 50 mg.  I instructed patient to call us back if she has any questions or concerns as I would be glad to speak with her further about these instructions.   Marianna Payment, D.O. [facility], PGY-1 Pager: [phone], Phone: [phone] Date 07/29/2020 Time 10:58 AM

## 2020-07-29 NOTE — Telephone Encounter (Signed)
  Chronic Care Management   Outreach Note  07/29/2020 Name: [name] MRN: [id number] DOB: 26-Jun-1951  Referred by: Maudie Mercury, MD Reason for referral : Chronic Care Management (IDDM, HTN, HF, COPD, smokes, has home O2, has ICD)   An unsuccessful telephone outreach was attempted today. The patient was referred to the case management team for assistance with care management and care coordination.   Follow Up Plan: A HIPAA compliant phone message was left for the patient providing contact information and requesting a return call.  The care management team will reach out to the patient again over the next 7-10 days.   Kelli Churn RN, CCM, [facility] RN Care Manager [phone]

## 2020-07-30 NOTE — Progress Notes (Signed)
Internal Medicine Clinic Attending  Case discussed with Dr. Lisabeth Devoid  at the time of the visit.  We reviewed the resident's history and  I agree with the plan to have Ms. Dosanjh come to clinic this afternoon for an in-person visit.

## 2020-08-03 NOTE — Progress Notes (Signed)
Internal Medicine Clinic Attending  Case discussed with Dr. Coe  At the time of the visit.  We reviewed the resident's history and exam and pertinent patient test results.  I agree with the assessment, diagnosis, and plan of care documented in the resident's note.  

## 2020-08-06 ENCOUNTER — Telehealth: Payer: Medicare Other

## 2020-08-06 ENCOUNTER — Telehealth: Payer: Self-pay | Admitting: *Deleted

## 2020-08-06 NOTE — Telephone Encounter (Addendum)
  Chronic Care Management   Outreach Note  08/06/2020 Name: [name] MRN: [id number] DOB: 08/26/1951  Referred by: Maudie Mercury, MD Reason for referral : Chronic Care Management (DDM, HTN, HF, COPD)   A second unsuccessful telephone outreach was attempted today. The patient was referred to the case management team for assistance with care management and care coordination.   Follow Up Plan: A HIPAA compliant phone message was left for the patient providing contact information and requesting a return call.  The care management team will reach out to the patient again over the next 7-14 days.   Kelli Churn RN, CCM, [facility] RN Care Manager [phone]

## 2020-08-07 ENCOUNTER — Telehealth: Payer: Medicare Other

## 2020-08-18 DIAGNOSIS — E119 Type 2 diabetes mellitus without complications: Secondary | ICD-10-CM | POA: Diagnosis not present

## 2020-08-18 DIAGNOSIS — H524 Presbyopia: Secondary | ICD-10-CM | POA: Diagnosis not present

## 2020-08-18 LAB — HM DIABETES EYE EXAM

## 2020-08-20 ENCOUNTER — Telehealth: Payer: Self-pay | Admitting: *Deleted

## 2020-08-20 ENCOUNTER — Telehealth: Payer: Medicare Other

## 2020-08-20 NOTE — Telephone Encounter (Signed)
Internal Medicine Clinic Resident  I have personally reviewed this encounter including the documentation in this note and/or discussed this patient with the care management provider. I will address any urgent items identified by the care management provider and will communicate my actions to the patient's PCP. I have reviewed the patient's CCM visit with my supervising attending, Dr Heber Ephrata.  Patient not answering phone calls, Ms. Broadus John will attempt to reach patient at visit on 12/20  Sanjuana Letters, MD 08/20/2020

## 2020-08-20 NOTE — Telephone Encounter (Signed)
  Chronic Care Management   Outreach Note  08/20/2020 Name: [name] MRN: [id number] DOB: Oct 15, 1950  Referred by: Maudie Mercury, MD Reason for referral : Chronic Care Management (IDDM, HTN, HF, COPD, smokes, has home O2, has ICD- implantable cardiac defib)   Third unsuccessful telephone outreach was attempted today. The patient was referred to the case management team for assistance with care management and care coordination. The patient's primary care provider has been notified of our unsuccessful attempts to make or maintain contact with the patient. The care management team is pleased to engage with this patient at any time in the future should he/she be interested in assistance from the care management team.   Follow Up Plan: Left message advising patietn this CCM RN will plan to meet with her during her clinic visit on 09/21/20 to determine if patient will participate in CCM program on follow up calls.  Kelli Churn RN, CCM, [facility] RN Care Manager [phone]

## 2020-08-25 ENCOUNTER — Institutional Professional Consult (permissible substitution): Payer: Medicare Other | Admitting: Internal Medicine

## 2020-08-29 DIAGNOSIS — I509 Heart failure, unspecified: Secondary | ICD-10-CM | POA: Diagnosis not present

## 2020-08-29 DIAGNOSIS — J449 Chronic obstructive pulmonary disease, unspecified: Secondary | ICD-10-CM | POA: Diagnosis not present

## 2020-09-08 ENCOUNTER — Other Ambulatory Visit: Payer: Self-pay

## 2020-09-08 ENCOUNTER — Ambulatory Visit (INDEPENDENT_AMBULATORY_CARE_PROVIDER_SITE_OTHER): Payer: Medicare Other | Admitting: Emergency Medicine

## 2020-09-08 DIAGNOSIS — I5042 Chronic combined systolic (congestive) and diastolic (congestive) heart failure: Secondary | ICD-10-CM | POA: Diagnosis not present

## 2020-09-08 LAB — CUP PACEART INCLINIC DEVICE CHECK
Battery Remaining Longevity: 51 mo
Brady Statistic RV Percent Paced: 0 %
Date Time Interrogation Session: 20211207085951
HighPow Impedance: 49.7622
Implantable Lead Implant Date: 20080801
Implantable Lead Location: 753860
Implantable Lead Model: 7120
Implantable Pulse Generator Implant Date: 20150701
Lead Channel Impedance Value: 437.5 Ohm
Lead Channel Pacing Threshold Amplitude: 0.5 V
Lead Channel Pacing Threshold Pulse Width: 0.5 ms
Lead Channel Sensing Intrinsic Amplitude: 11.9 mV
Lead Channel Setting Pacing Amplitude: 2.5 V
Lead Channel Setting Pacing Pulse Width: 0.5 ms
Lead Channel Setting Sensing Sensitivity: 0.5 mV
Pulse Gen Serial Number: 7206538

## 2020-09-08 NOTE — Progress Notes (Signed)
ICD check in clinic. Normal device function. Thresholds and sensing consistent with previous device measurements. Impedance trends stable over time. 1 ventricular arrhythmia with EGM that showed NSVT , 10 beats duration.[name] Histogram distribution appropriate for patient and level of activity. No changes made this session. Device programmed at appropriate safety margins. Device programmed to optimize intrinsic conduction. Estimated longevity 4 yrs, 3 months. Pt declines remote follow-up. Will check at 1 year f/u with GT in February , 2022 and in -clinic check scheduled for 05/09/21.Patient education completed including shock plan. Auditory/vibratory alert demonstrated.

## 2020-09-14 ENCOUNTER — Encounter: Payer: Self-pay | Admitting: Internal Medicine

## 2020-09-14 ENCOUNTER — Ambulatory Visit (INDEPENDENT_AMBULATORY_CARE_PROVIDER_SITE_OTHER): Payer: Medicare Other | Admitting: Internal Medicine

## 2020-09-14 ENCOUNTER — Other Ambulatory Visit: Payer: Self-pay

## 2020-09-14 VITALS — BP 128/74 | HR 87 | Ht 61.0 in | Wt 198.4 lb

## 2020-09-14 DIAGNOSIS — I5020 Unspecified systolic (congestive) heart failure: Secondary | ICD-10-CM

## 2020-09-14 DIAGNOSIS — R0602 Shortness of breath: Secondary | ICD-10-CM | POA: Diagnosis not present

## 2020-09-14 DIAGNOSIS — Z720 Tobacco use: Secondary | ICD-10-CM | POA: Diagnosis not present

## 2020-09-14 DIAGNOSIS — Z716 Tobacco abuse counseling: Secondary | ICD-10-CM | POA: Diagnosis not present

## 2020-09-14 MED ORDER — BUPROPION HCL ER (SR) 150 MG PO TB12: 150.0000 mg | ORAL_TABLET | Freq: Two times a day (BID) | ORAL | 5 refills | Status: DC

## 2020-09-14 NOTE — Patient Instructions (Addendum)
The patient should have follow up scheduled with APP in 4-6 weeks   Bupropion -- Bupropion (brand names: Zyban, Wellbutrin) is an antidepressant that can be used to help you stop smoking.  Start taking it once per day for three days, then increase to twice daily starting four weeks before the quit date.  You should typically continue for 7 to 12 weeks after you quit smoking.  Please do not stop taking this medication abruptly.  When you are ready to stop we will have you go to once daily for two weeks and then you can do once every other day for a week before you stop.   Bupropion is generally well-tolerated, but it may cause dry mouth and difficulty sleeping. The drug should not be used by people who have a seizure disorder or bipolar (manic-depressive) disorder, and it is not recommended for those who have head trauma, anorexia nervosa, or bulimia, or for those who drink alcohol excessively.    What are the benefits of quitting smoking? Quitting smoking can lower your chances of getting or dying from heart disease, lung disease, kidney failure, infection, or cancer. It can also lower your chances of getting osteoporosis, a condition that makes your bones weak. Plus, quitting smoking can help your skin look younger and reduce the chances that you will have problems with sex.  Quitting smoking will improve your health no matter how old you are, and no matter how long or how much you have smoked.  What should I do if I want to quit smoking? The letters in the word "START" can help you remember the steps to take: S = Set a quit date. T = Tell family, friends, and the people around you that you plan to quit. A = Anticipate or plan ahead for the tough times you'll face while quitting. R = Remove cigarettes and other tobacco products from your home, car, and work. T = Talk to your doctor about getting help to quit.  How can my doctor or nurse help? Your doctor or nurse can give you advice on the  best way to quit. He or she can also put you in touch with counselors or other people you can call for support. Plus, your doctor or nurse can give you medicines to: ?Reduce your craving for cigarettes ?Reduce the unpleasant symptoms that happen when you stop smoking (called "withdrawal symptoms"). You can also get help from a free phone line (1-800-QUIT-NOW) or go online to [place].  What are the symptoms of withdrawal? The symptoms include: ?Trouble sleeping ?Being irritable, anxious or restless ?Getting frustrated or angry ?Having trouble thinking clearly  Some people who stop smoking become temporarily depressed. Some people need treatment for depression, such as counseling or antidepressant medicines. Depressed people might: ?No longer enjoy or care about doing the things they used to like to do ?Feel sad, down, hopeless, nervous, or cranky most of the day, almost every day ?Lose or gain weight ?Sleep too much or too little ?Feel tired or like they have no energy ?Feel guilty or like they are worth nothing ?Forget things or feel confused ?Move and speak more slowly than usual ?Act restless or have trouble staying still ?Think about death or suicide  If you think you might be depressed, see your doctor or nurse. Only someone trained in mental health can tell for sure if you are depressed. If you ever feel like you might hurt yourself, go straight to the nearest emergency department. Or you can  call for an ambulance (in the [place] and [place], [place] 9-1-1) or call your doctor or nurse right away and tell them it is an emergency. You can also reach the [place] National Suicide Prevention Lifeline at 480-107-8807 or http://walker-sanchez.info/.  How do medicines help you stop smoking? Different medicines work in different ways: ?Nicotine replacement therapy eases withdrawal and reduces your body's craving for nicotine, the main drug found in cigarettes. There are different forms  of nicotine replacement, including skin patches, lozenges, gum, nasal sprays, and "puffers" or inhalers. Many can be bought without a prescription, while others might require one. ?Bupropion is a prescription medicine that reduces your desire to smoke. This medicine is sold under the brand names Zyban and Wellbutrin. It is also available in a generic version, which is cheaper than brand name medicines. ?Varenicline (brand names: Chantix, Champix) is a prescription medicine that reduces withdrawal symptoms and cigarette cravings. If you think you'd like to take varenicline and you have a history of depression, anxiety, or heart disease, discuss this with your doctor or nurse before taking the medicine. Varenicline can also increase the effects of alcohol in some people. It's a good idea to limit drinking while you're taking it, at least until you know how it affects you.  How does counseling work? Counseling can happen during formal office visits or just over the phone. A counselor can help you: ?Figure out what triggers your smoking and what to do instead ?Overcome cravings ?Figure out what went wrong when you tried to quit before  What works best? Studies show that people have the best luck at quitting if they take medicines to help them quit and work with a Social worker. It might also be helpful to combine nicotine replacement with one of the prescription medicines that help people quit. In some cases, it might even make sense to take bupropion and varenicline together.  What about e-cigarettes? Sometimes people wonder if using electronic cigarettes, or "e-cigarettes," might help them quit smoking. Using e-cigarettes is also called "vaping." Doctors do not recommend e-cigarettes in place of medicines and counseling. That's because e-cigarettes still contain nicotine as well as other substances that might be harmful. It's not clear how they can affect a person's health in the long term.  Will I gain  weight if I quit? Yes, you might gain a few pounds. But quitting smoking will have a much more positive effect on your health than weighing a few pounds more. Plus, you can help prevent some weight gain by being more active and eating less. Taking the medicine bupropion might help control weight gain.   What else can I do to improve my chances of quitting? You can: ?Start exercising. ?Stay away from smokers and places that you associate with smoking. If people close to you smoke, ask them to quit with you. ?Keep gum, hard candy, or something to put in your mouth handy. If you get a craving for a cigarette, try one of these instead. ?Don't give up, even if you start smoking again. It takes most people a few tries before they succeed.  What if I am pregnant and I smoke? If you are pregnant, it's really important for the health of your baby that you quit. Ask your doctor what options you have, and what is safest for your baby

## 2020-09-14 NOTE — Progress Notes (Signed)
[name]    [id number]    Aug 31, 1951  Primary Care Physician:Winters, Remo Lipps, MD  Referring Physician: Oda Kilts, MD [place],  [place] [id number] Reason for Consultation: chronic cough Date of Consultation: 09/14/2020  Chief complaint:   Chief Complaint  Patient presents with  . Consult    Pt is being referred due to a persistent cough. Pt states she had a cold about 6 months ago which she had problems with a cough but pt states that she is no longer having problems with the cough. Pt does have some SOB when she exerts herself.     HPI: [name] is a 69 y.o. woman with HFrEF EF 25% who presents for cough. She had a cough 3-4 months ago and this is resolved now.  She does have some shortness of breath with minimal exertion including walking. She occasionally gets chest tightness and wheezing. She had pneumonia last year and was hospitalized for heart failure in May 2021. She takes Anoro once a day which helps her breathing. Also has albuterol which she takes prn less than once/week. She had a split night study in 2017 and was told she didn't need CPAP.   She smokes 1/2 ppd but is currently down to 1-2 cigarettes she says she has only had two today. She is interested in quitting smoking.   Social history:  Occupation: Worked as a Quarry manager, disabled and now retired.  Exposures: lives at home with her son.  Smoking history: 22.5 pack years. Thinking about quitting. Never quit before.   Social History   Occupational History  . Occupation: disabled  Tobacco Use  . Smoking status: Current Every Day Smoker    Packs/day: 0.50    Years: 45.00    Pack years: 22.50    Types: Cigarettes  . Smokeless tobacco: Never Used  . Tobacco comment: currently smoking 1-2 cigs per day  Vaping Use  . Vaping Use: Never used  Substance and Sexual Activity  . Alcohol use: No    Alcohol/week: 1.0 standard drink    Types: 1 Glasses of wine per week  . Drug use: No   . Sexual activity: Not Currently    Relevant family history:  Family History  Problem Relation Age of Onset  . Stroke Mother   . Seizures Father   . Heart disease Father   . Diabetes Sister   . Asthma Maternal Aunt        aunts  . Asthma Maternal Uncle        uncles  . Heart disease Paternal Aunt        aunts  . Heart disease Paternal Uncle        uncles  . Heart disease Maternal Aunt        aunts  . Heart disease Maternal Uncle        uncles  . Heart disease Maternal Grandfather   . Breast cancer Daughter   . Breast cancer Cousin   . Asthma Grandchild   . Colon cancer Neg Hx   . Colon polyps Neg Hx   . Esophageal cancer Neg Hx   . Kidney disease Neg Hx   . Gallbladder disease Neg Hx     Past Medical History:  Diagnosis Date  . ATN (acute tubular necrosis) ([place]) 07/15/2014  . Automatic implantable cardioverter-defibrillator in situ   . Automatic implantable cardioverter-defibrillator in situ 05/04/2007   Qualifier: Diagnosis of  By: Isla Pence    . Cardiac defibrillator in situ    Atlas II VR (SJM) implanted by Dr Lovena Le  . CHF (congestive heart failure) ([place])   . Chronic combined systolic and diastolic heart failure (HCC)    a. EF 35-40% in past;  b. Echo 7/13:  EF 45-50%, Gr 2 diast dysfn, mild AI, mild MAC, trivial MR, mild LAE, PASP 47.  [name] Chronic ulcer of leg ([place])    04-09-15 resolved-not a problem.  . Colon polyps 04/12/2013   Rectosigmoid polyp  . COPD (chronic obstructive pulmonary disease) (HCC)    O2 at night  . Depression   . Diabetes mellitus   . Diabetes mellitus, type 2 ([place]) 04/03/2012   HIGH RISK FEET.. Please have patient take shoes and socks off every visit for visual foot inspection.    . Eczema   . Elevated alkaline phosphatase level    GGT and 5'nucleotidase 8/13 normal  . Health care maintenance 01/22/2013   Surgically absent cervix- no pap needed (Path report 07/2000: uterine body with attached bilateral adnexa and separate cervix.)   . History of oxygen administration    oxygen @ 2 l/m nasally bedtime 24/7  . HTN (hypertension)   . Hx of cardiac cath    a. [place] 2003 normal;  b. LHC 6/13:  Mild calcification in the LM, o/w normal coronary arteries, EF 45%.   . Hyperkalemia 08/08/2017  . Hyperlipidemia    Elevated triglyceride in 2019  . Hyperlipidemia associated with type 2 diabetes mellitus ([place]) 05/25/2007   Qualifier: Diagnosis of  By: Hassell Done FNP, Tori Milks    . Hyperthyroidism, subclinical   . HYPERTHYROIDISM, SUBCLINICAL 05/06/2009   Qualifier: Diagnosis of  By: Darrick Meigs    . Implantable cardioverter-defibrillator (ICD) generator end of life   . Lipoma   . Need for hepatitis C screening test 04/25/2019  . NICM (nonischemic cardiomyopathy) ([place])   . Obesity   . On home oxygen therapy    "2L; 24/7" (10/11/2016)  . Post menopausal syndrome   . Shortness of breath 07/14/2017  . Skin rash 07/12/2018  . Sleep apnea    pt denies 04/12/2013    Past Surgical History:  Procedure Laterality Date  . ABDOMINAL HYSTERECTOMY    . CARDIAC CATHETERIZATION    . CARDIAC CATHETERIZATION N/A 06/21/2016   Procedure: Left Heart Cath and Coronary Angiography;  Surgeon: Sherren Mocha, MD;  Location: [facility];  Service: Cardiovascular;  Laterality: N/A;  . CARDIAC DEFIBRILLATOR PLACEMENT  05/04/2007   SJM Atlas II VR ICD  . CARDIAC DEFIBRILLATOR PLACEMENT    . CATARACT EXTRACTION     od  . COLONOSCOPY N/A 04/12/2013   Procedure: COLONOSCOPY;  Surgeon: Beryle Beams, MD;  Location: [facility];  Service: Endoscopy;  Laterality: N/A;  pt.has defibrilator  . COLONOSCOPY N/A 04/16/2015   Procedure: COLONOSCOPY;  Surgeon: [name], MD;  Location: [facility];  Service: Endoscopy;  Laterality: N/A;  . ESOPHAGOGASTRODUODENOSCOPY N/A 04/16/2015   Procedure: ESOPHAGOGASTRODUODENOSCOPY (EGD);  Surgeon: [name], MD;  Location: [facility];  Service: Endoscopy;  Laterality: N/A;  . EYE SURGERY    . HERNIA  REPAIR    . HYSTEROSCOPY    . IMPLANTABLE CARDIOVERTER DEFIBRILLATOR (ICD) GENERATOR CHANGE N/A 04/02/2014   Procedure: ICD GENERATOR CHANGE;  Surgeon: Evans Lance, MD;  Location: [facility];  Service: Cardiovascular;  Laterality: N/A;  . INSERT / REPLACE / REMOVE PACEMAKER    . LEFT HEART CATHETERIZATION  WITH CORONARY ANGIOGRAM N/A 04/02/2012   Procedure: LEFT HEART CATHETERIZATION WITH CORONARY ANGIOGRAM;  Surgeon: Hillary Bow, MD;  Location: [facility] C[facility] LAB;  Service: Cardiovascular;  Laterality: N/A;  . ORIF ANKLE FRACTURE Right 08/12/2019   Procedure: OPEN REDUCTION INTERNAL FIXATION (ORIF) RIGHT TRIMALLEOLAR ANKLE FRACTURE;  Surgeon: Leandrew Koyanagi, MD;  Location: [facility];  Service: Orthopedics;  Laterality: Right;  . TONSILLECTOMY    . TUBAL LIGATION       Physical Exam: Blood pressure 128/74, pulse 87, height _0  (1.549 m), weight 198 lb 6.4 oz (90 kg), SpO2 98 %. Gen:      No acute distress ENT:  no nasal polyps, mucus membranes moist, mallampati IV Lungs:    No increased respiratory effort, symmetric chest wall excursion, clear to auscultation bilaterally, no wheezes or crackles CV:         Regular rate and rhythm; no rubs, or gallops soft +systolic murmur.  1+pedal edema Abd:      + bowel sounds; soft, non-tender; no distension MSK: no acute synovitis of DIP or PIP joints, no mechanics hands.  Skin:      Warm and dry; no rashes Neuro: normal speech, no focal facial asymmetry Psych: alert and oriented x3, normal mood and affect   Data Reviewed/Medical Decision Making:  Independent interpretation of tests: Imaging: . Review of patient's chest xray may 2021 images revealed cardiomegaly, s/p ICD, pulmonary edema. The patient's images have been independently reviewed by me.    PFTs: I have personally reviewed the patient's PFTs and in Jan 2020 show mild restriction to ventilation likely secondary to body habitus.  PFT Results Latest Ref Rng & Units 10/26/2018 07/03/2015   FVC-Pre L 1.40 1.15  FVC-Predicted Pre % 63 50  FVC-Post L 1.40 1.43  FVC-Predicted Post % 63 62  Pre FEV1/FVC % % 79 81  Post FEV1/FCV % % 79 75  FEV1-Pre L 1.11 0.93  FEV1-Predicted Pre % 65 52  FEV1-Post L 1.10 1.08  DLCO uncorrected ml/min/mmHg 11.16 14.11  DLCO UNC% % 55 69  DLVA Predicted % 107 125  TLC L 3.43 7.30  TLC % Predicted % 74 158  RV % Predicted % 99 323   Echocardiogram May 2021  1. Left ventricular ejection fraction, by estimation, is 25%. The left  ventricle has severely decreased function. The left ventricle demonstrates  global hypokinesis. The left ventricular internal cavity size was mildly  dilated. There is mild left  ventricular hypertrophy. Left ventricular diastolic parameters are  indeterminate.  2. Right ventricular systolic function is normal. The right ventricular  size is normal. There is moderately elevated pulmonary artery systolic  pressure. The estimated right ventricular systolic pressure is 92.4 mmHg.  3. Left atrial size was mildly dilated.  4. The mitral valve is abnormal. Mild mitral valve regurgitation. No  evidence of mitral stenosis.  5. The aortic valve is normal in structure. Aortic valve regurgitation is  mild. No aortic stenosis is present.  6. The inferior vena cava is normal in size with greater than 50%  respiratory variability, suggesting right atrial pressure of 3 mmHg.   Labs:  Lab Results  Component Value Date   NA 138 07/14/2020   K 4.8 07/14/2020   CL 101 07/14/2020   CO2 20 07/14/2020   Lab Results  Component Value Date   WBC 8.8 02/21/2020   HGB 11.8 (L) 02/21/2020   HCT 38.4 02/21/2020   MCV 86.3 02/21/2020   PLT 198 02/21/2020  Immunization status:  Immunization History  Administered Date(s) Administered  . H1N1 10/27/2008  . Influenza Whole 08/23/2007, 07/29/2009, 08/06/2010  . Influenza,inj,Quad PF,6+ Mos 06/28/2013, 07/13/2014, 06/02/2015, 06/01/2016, 06/21/2017, 06/13/2018,  06/19/2019, 07/27/2020  . Moderna Sars-Covid-2 Vaccination 11/25/2019, 12/31/2019, 07/28/2020  . Pneumococcal Polysaccharide-23 06/20/2008, 07/13/2014, 07/13/2015, 07/15/2017  . Td 10/03/2001  . Tdap 07/09/2013    . I reviewed prior external note(s) from PCP, cardiology . I reviewed the result(s) of the labs and imaging as noted above.    Assessment:  Shortness of breath CAD HFrEF EF 25% s/p ICD Tobacco Use Disorder Cough - resolved  Plan/Recommendations:  Ms. Jaworowski has mild smoking related lung disease based on PFTs. I think her cardiac disease and deconditioning are more responsible for her symptoms. Continue Anoro and albuterol prn for now. We discussed exercise regularly with walking as well.   I personally spent 5 minutes counseling the patient regarding tobacco use disorder.  Patient is symptomatic from tobacco use disorder due to the following condition: COPD, heart failure.  The patient's response was pre-contemplative.  We discussed nicotine replacement therapy, Wellbutrin.  We identified to gather patient specific barriers to change.  The patient is open to future discussions about tobacco cessation. She would like to try wellbutrin. Reviewed risks and benefits. Will prescribe today.  We discussed disease management and progression at length today.   Return to Care: Return in about 6 weeks (around 10/26/2020), or if symptoms worsen or fail to improve.  Lenice Llamas, MD Pulmonary and Critical Care Medicine [place]  CC: Oda Kilts, MD

## 2020-09-16 NOTE — Progress Notes (Signed)
   Covid-19 Vaccination Clinic  Name:  GELSEY AMYX    MRN: [id number] DOB: [birth year]  09/16/2020  Ms. Colvin was observed post Covid-19 immunization for 15 minutes without incident. She was provided with Vaccine Information Sheet and instruction to access the V-Safe system.   Ms. Randle was instructed to call 911 with any severe reactions post vaccine: [name] Difficulty breathing  . Swelling of face and throat  . A fast heartbeat  . A bad rash all over body  . Dizziness and weakness   Immunizations Administered    No immunizations on file.

## 2020-09-21 ENCOUNTER — Encounter: Payer: Self-pay | Admitting: Internal Medicine

## 2020-09-21 ENCOUNTER — Other Ambulatory Visit: Payer: Self-pay

## 2020-09-21 ENCOUNTER — Ambulatory Visit (INDEPENDENT_AMBULATORY_CARE_PROVIDER_SITE_OTHER): Payer: Medicare Other | Admitting: Internal Medicine

## 2020-09-21 ENCOUNTER — Ambulatory Visit: Payer: Medicare Other | Admitting: *Deleted

## 2020-09-21 DIAGNOSIS — I10 Essential (primary) hypertension: Secondary | ICD-10-CM

## 2020-09-21 DIAGNOSIS — I5042 Chronic combined systolic (congestive) and diastolic (congestive) heart failure: Secondary | ICD-10-CM

## 2020-09-21 DIAGNOSIS — E1142 Type 2 diabetes mellitus with diabetic polyneuropathy: Secondary | ICD-10-CM

## 2020-09-21 NOTE — Chronic Care Management (AMB) (Signed)
  Chronic Care Management   Note  09/21/2020 Name: [name] MRN: [id number] DOB: [birth year]   Requested to speak with patient and clinic staff nurse Adline Peals informed patient but patient said she did not have time to speak with this CCM RN.   Follow up plan: Will plan to make one more outreach attempt before closing referral.  Kelli Churn RN, CCM, [facility] RN Care Manager [phone]

## 2020-09-21 NOTE — Patient Instructions (Signed)
Ms. Cumby,  It was a pleasure seeing you today! Today we discussed your hypertension. Your blood pressures are doing well, and we will not make any changes today! We will see you in 4 weeks time to follow up your blood sugars! Have a happy holiday season.  Sincerely,  Maudie Mercury, MD

## 2020-09-21 NOTE — Progress Notes (Signed)
CC: Blood Pressure Follow Up  HPI:  [name] is a 69 y.o. M/F, with a PMH noted below, who presents to the clinic for follow up on her hypertension. To see the management of their acute and chronic conditions, please see the A&P note under the Encounters tab.   Past Medical History:  Diagnosis Date  . ATN (acute tubular necrosis) ([place]) 07/15/2014  . Automatic implantable cardioverter-defibrillator in situ   . Automatic implantable cardioverter-defibrillator in situ 05/04/2007   Qualifier: Diagnosis of  By: Isla Pence    . Cardiac defibrillator in situ    Atlas II VR (SJM) implanted by Dr Lovena Le  . CHF (congestive heart failure) ([place])   . Chronic combined systolic and diastolic heart failure (HCC)    a. EF 35-40% in past;  b. Echo 7/13:  EF 45-50%, Gr 2 diast dysfn, mild AI, mild MAC, trivial MR, mild LAE, PASP 47.  [name] Chronic ulcer of leg ([place])    04-09-15 resolved-not a problem.  . Colon polyps 04/12/2013   Rectosigmoid polyp  . COPD (chronic obstructive pulmonary disease) (HCC)    O2 at night  . Depression   . Diabetes mellitus   . Diabetes mellitus, type 2 ([place]) 04/03/2012   HIGH RISK FEET.. Please have patient take shoes and socks off every visit for visual foot inspection.    . Eczema   . Elevated alkaline phosphatase level    GGT and 5'nucleotidase 8/13 normal  . Health care maintenance 01/22/2013   Surgically absent cervix- no pap needed (Path report 07/2000: uterine body with attached bilateral adnexa and separate cervix.)  . History of oxygen administration    oxygen @ 2 l/m nasally bedtime 24/7  . HTN (hypertension)   . Hx of cardiac cath    a. [place] 2003 normal;  b. LHC 6/13:  Mild calcification in the LM, o/w normal coronary arteries, EF 45%.   . Hyperkalemia 08/08/2017  . Hyperlipidemia    Elevated triglyceride in 2019  . Hyperlipidemia associated with type 2 diabetes mellitus ([place]) 05/25/2007   Qualifier: Diagnosis of  By: Hassell Done FNP, Tori Milks    .  Hyperthyroidism, subclinical   . HYPERTHYROIDISM, SUBCLINICAL 05/06/2009   Qualifier: Diagnosis of  By: Darrick Meigs    . Implantable cardioverter-defibrillator (ICD) generator end of life   . Lipoma   . Need for hepatitis C screening test 04/25/2019  . NICM (nonischemic cardiomyopathy) ([place])   . Obesity   . On home oxygen therapy    "2L; 24/7" (10/11/2016)  . Post menopausal syndrome   . Shortness of breath 07/14/2017  . Skin rash 07/12/2018  . Sleep apnea    pt denies 04/12/2013   Review of Systems:   Review of Systems  Constitutional: Negative for chills, fever and weight loss.  Respiratory: Negative for cough and hemoptysis.   Cardiovascular: Negative for chest pain and palpitations.  Gastrointestinal: Negative for abdominal pain, constipation, diarrhea, nausea and vomiting.     Physical Exam:  Vitals:   09/21/20 0958  BP: 134/68  Pulse: 83  Temp: 98.4 F (36.9 C)  TempSrc: Oral  SpO2: 99%  Weight: 196 lb 11.2 oz (89.2 kg)   Physical Exam Constitutional:      Appearance: Normal appearance.  Cardiovascular:     Rate and Rhythm: Normal rate and regular rhythm.     Pulses: Normal pulses.     Heart sounds: Normal heart sounds. No murmur heard. No friction rub. No gallop.   Pulmonary:  Effort: Pulmonary effort is normal.     Breath sounds: Normal breath sounds. No wheezing, rhonchi or rales.  Abdominal:     General: Abdomen is flat. Bowel sounds are normal.     Palpations: Abdomen is soft.     Tenderness: There is no abdominal tenderness.  Neurological:     Mental Status: She is alert and oriented to person, place, and time.  Psychiatric:        Mood and Affect: Mood normal.        Behavior: Behavior normal.      Assessment & Plan:   See Encounters Tab for problem based charting.  Patient discussed with Dr. Rebeca Alert

## 2020-09-21 NOTE — Assessment & Plan Note (Addendum)
[name] presents to the clinic today for follow up on her hypertension. She currently take spironolactone 12.5 mg daily, metoprolol 100 mg daily, and furosemide 40 mg twice daily. During her 07/28/20 visit, her losartan was up titrated to 50 mg. Her blood pressure today is 134/68 with a pulse of 83. She does have a Hx of combined systolic and diastolic heart failure. She is taking 100 mg metoprolol daily, but her pulse is still elevated. She is due to have her A1c checked in a month, will reevaluate her BP and consider increasing metoprolol, if blood pressure is uncontrolled.  - Continue current regimen.  - Follow up in 1 month's time

## 2020-09-21 NOTE — Progress Notes (Signed)
Internal Medicine Clinic Resident  I have personally reviewed this encounter including the documentation in this note and/or discussed this patient with the care management provider. I will address any urgent items identified by the care management provider and will communicate my actions to the patient's PCP. I have reviewed the patient's CCM visit with my supervising attending, Dr Rebeca Alert.  Jean Rosenthal, MD 09/21/2020

## 2020-09-22 ENCOUNTER — Ambulatory Visit: Payer: Medicare Other | Admitting: *Deleted

## 2020-09-22 DIAGNOSIS — I10 Essential (primary) hypertension: Secondary | ICD-10-CM

## 2020-09-22 DIAGNOSIS — E1142 Type 2 diabetes mellitus with diabetic polyneuropathy: Secondary | ICD-10-CM

## 2020-09-22 DIAGNOSIS — I5042 Chronic combined systolic (congestive) and diastolic (congestive) heart failure: Secondary | ICD-10-CM

## 2020-09-22 NOTE — Chronic Care Management (AMB) (Signed)
  Chronic Care Management   Outreach Note  09/22/2020 Name: [name] MRN: [id number] DOB: Nov 30, 1950  Referred by: Maudie Mercury, MD Reason for referral : Chronic Care Management (IDDM, HTN, HF, COPD, smokes, has home O2, has ICD)   ANGE PUSKAS is enrolled in a Managed [place]: No  Fourth unsuccessful telephone outreach was attempted today. The patient was referred to the case management team for assistance with care management and care coordination. The patient's primary care provider has been notified of our unsuccessful attempts to make or maintain contact with the patient. The care management team is pleased to engage with this patient at any time in the future should he/she be interested in assistance from the care management team.   Follow Up Plan: No further follow up required: closing referral as unable to maintain contact with patient.   Kelli Churn RN, CCM, [facility] RN Care Manager [phone]

## 2020-09-22 NOTE — Progress Notes (Signed)
Internal Medicine Clinic Attending  Case discussed with Dr. Winters at the time of the visit.  We reviewed the resident's history and exam and pertinent patient test results.  I agree with the assessment, diagnosis, and plan of care documented in the resident's note.  Maxima Skelton, M.D., Ph.D.  

## 2020-09-22 NOTE — Progress Notes (Signed)
Internal Medicine Clinic Resident  I have personally reviewed this encounter including the documentation in this note and/or discussed this patient with the care management provider. I will address any urgent items identified by the care management provider and will communicate my actions to the patient's PCP. I have reviewed the patient's CCM visit with my supervising attending, Dr Narendra.  Cillian Gwinner K Lindaann Gradilla, MD 09/22/2020    

## 2020-09-22 NOTE — Progress Notes (Signed)
Internal Medicine Clinic Attending  CCM services provided by the care management provider and their documentation were reviewed with Dr. Eileen Stanford.  We reviewed the pertinent findings, urgent action items addressed by the resident and non-urgent items to be addressed by the PCP.  I agree with the assessment, diagnosis, and plan of care documented in the CCM and resident's note.  Oda Kilts, MD 09/22/2020

## 2020-09-28 DIAGNOSIS — J449 Chronic obstructive pulmonary disease, unspecified: Secondary | ICD-10-CM | POA: Diagnosis not present

## 2020-09-28 DIAGNOSIS — I509 Heart failure, unspecified: Secondary | ICD-10-CM | POA: Diagnosis not present

## 2020-09-30 NOTE — Progress Notes (Signed)
Internal Medicine Clinic Attending  CCM services provided by the care management provider and their documentation were discussed with Dr. Eileen Stanford. We reviewed the pertinent findings, urgent action items addressed by the resident and non-urgent items to be addressed by the PCP.  I agree with the assessment, diagnosis, and plan of care documented in the CCM and resident's note.  Aldine Contes, MD 09/30/2020

## 2020-10-11 ENCOUNTER — Other Ambulatory Visit: Payer: Self-pay | Admitting: Student

## 2020-10-11 DIAGNOSIS — E1142 Type 2 diabetes mellitus with diabetic polyneuropathy: Secondary | ICD-10-CM

## 2020-10-20 ENCOUNTER — Encounter: Payer: Medicare Other | Admitting: Student

## 2020-10-25 ENCOUNTER — Other Ambulatory Visit: Payer: Self-pay | Admitting: Internal Medicine

## 2020-10-25 DIAGNOSIS — I5042 Chronic combined systolic (congestive) and diastolic (congestive) heart failure: Secondary | ICD-10-CM

## 2020-10-25 DIAGNOSIS — E1142 Type 2 diabetes mellitus with diabetic polyneuropathy: Secondary | ICD-10-CM

## 2020-10-26 ENCOUNTER — Ambulatory Visit: Payer: Medicare Other | Admitting: Primary Care

## 2020-10-29 DIAGNOSIS — J449 Chronic obstructive pulmonary disease, unspecified: Secondary | ICD-10-CM | POA: Diagnosis not present

## 2020-10-29 DIAGNOSIS — I509 Heart failure, unspecified: Secondary | ICD-10-CM | POA: Diagnosis not present

## 2020-11-29 DIAGNOSIS — I509 Heart failure, unspecified: Secondary | ICD-10-CM | POA: Diagnosis not present

## 2020-11-29 DIAGNOSIS — J449 Chronic obstructive pulmonary disease, unspecified: Secondary | ICD-10-CM | POA: Diagnosis not present

## 2020-12-27 DIAGNOSIS — J449 Chronic obstructive pulmonary disease, unspecified: Secondary | ICD-10-CM | POA: Diagnosis not present

## 2020-12-27 DIAGNOSIS — I509 Heart failure, unspecified: Secondary | ICD-10-CM | POA: Diagnosis not present

## 2021-01-27 ENCOUNTER — Other Ambulatory Visit: Payer: Self-pay | Admitting: Internal Medicine

## 2021-01-27 DIAGNOSIS — I509 Heart failure, unspecified: Secondary | ICD-10-CM | POA: Diagnosis not present

## 2021-01-27 DIAGNOSIS — Z1231 Encounter for screening mammogram for malignant neoplasm of breast: Secondary | ICD-10-CM

## 2021-01-27 DIAGNOSIS — J449 Chronic obstructive pulmonary disease, unspecified: Secondary | ICD-10-CM | POA: Diagnosis not present

## 2021-02-03 NOTE — Progress Notes (Signed)
CC: Diabetes, hypertension  HPI:  [name] is a 70 y.o. with a history of combined heart failure, ICD in place, hypertension, restrictive lung disease, diabetes type 2, and HLD presenting for follow up of her diabetes and hypertension.   Past Medical History:  Diagnosis Date  . ATN (acute tubular necrosis) ([place]) 07/15/2014  . Automatic implantable cardioverter-defibrillator in situ   . Automatic implantable cardioverter-defibrillator in situ 05/04/2007   Qualifier: Diagnosis of  By: Isla Pence    . Cardiac defibrillator in situ    Atlas II VR (SJM) implanted by Dr Lovena Le  . CHF (congestive heart failure) ([place])   . Chronic combined systolic and diastolic heart failure (HCC)    a. EF 35-40% in past;  b. Echo 7/13:  EF 45-50%, Gr 2 diast dysfn, mild AI, mild MAC, trivial MR, mild LAE, PASP 47.  [name] Chronic ulcer of leg ([place])    04-09-15 resolved-not a problem.  . Colon polyps 04/12/2013   Rectosigmoid polyp  . COPD (chronic obstructive pulmonary disease) (HCC)    O2 at night  . Depression   . Diabetes mellitus   . Diabetes mellitus, type 2 ([place]) 04/03/2012   HIGH RISK FEET.. Please have patient take shoes and socks off every visit for visual foot inspection.    . Eczema   . Elevated alkaline phosphatase level    GGT and 5'nucleotidase 8/13 normal  . Health care maintenance 01/22/2013   Surgically absent cervix- no pap needed (Path report 07/2000: uterine body with attached bilateral adnexa and separate cervix.)  . History of oxygen administration    oxygen @ 2 l/m nasally bedtime 24/7  . HTN (hypertension)   . Hx of cardiac cath    a. [place] 2003 normal;  b. LHC 6/13:  Mild calcification in the LM, o/w normal coronary arteries, EF 45%.   . Hyperkalemia 08/08/2017  . Hyperlipidemia    Elevated triglyceride in 2019  . Hyperlipidemia associated with type 2 diabetes mellitus ([place]) 05/25/2007   Qualifier: Diagnosis of  By: Hassell Done FNP, Tori Milks    . Hyperthyroidism, subclinical    . HYPERTHYROIDISM, SUBCLINICAL 05/06/2009   Qualifier: Diagnosis of  By: Darrick Meigs    . Implantable cardioverter-defibrillator (ICD) generator end of life   . Lipoma   . Need for hepatitis C screening test 04/25/2019  . NICM (nonischemic cardiomyopathy) ([place])   . Obesity   . On home oxygen therapy    "2L; 24/7" (10/11/2016)  . Post menopausal syndrome   . Shortness of breath 07/14/2017  . Skin rash 07/12/2018  . Sleep apnea    pt denies 04/12/2013   Review of Systems:   Constitutional: Negative for chills and fever.  Respiratory: Negative for shortness of breath.   Cardiovascular: Negative for chest pain and leg swelling.  Gastrointestinal: Negative for abdominal pain, nausea and vomiting.  Neurological: Negative for dizziness and headaches.    Physical Exam:  Vitals:   02/04/21 1319  BP: (!) 155/84  Pulse: 86  Temp: 98.2 F (36.8 C)  TempSrc: Oral  SpO2: 96%  Weight: 203 lb 4.8 oz (92.2 kg)   Physical Exam Constitutional:      Appearance: She is obese.  HENT:     Mouth/Throat:     Mouth: Mucous membranes are moist.     Pharynx: Oropharynx is clear.  Eyes:     Extraocular Movements: Extraocular movements intact.     Conjunctiva/sclera: Conjunctivae normal.  Cardiovascular:     Rate and Rhythm:  Normal rate and regular rhythm.     Pulses: Normal pulses.     Heart sounds: Normal heart sounds.     Comments: ICD in place Pulmonary:     Effort: Pulmonary effort is normal.     Breath sounds: Normal breath sounds.  Abdominal:     General: Abdomen is flat. Bowel sounds are normal.     Palpations: Abdomen is soft.  Musculoskeletal:     Right lower leg: Edema (1+) present.     Left lower leg: Edema (1+) present.  Skin:    Capillary Refill: Capillary refill takes less than 2 seconds.  Neurological:     General: No focal deficit present.     Mental Status: She is alert and oriented to person, place, and time.      Assessment & Plan:   See Encounters Tab for  problem based charting.  Patient discussed with Dr. Evette Doffing

## 2021-02-04 ENCOUNTER — Encounter: Payer: Self-pay | Admitting: Internal Medicine

## 2021-02-04 ENCOUNTER — Ambulatory Visit (INDEPENDENT_AMBULATORY_CARE_PROVIDER_SITE_OTHER): Payer: Medicare Other | Admitting: Internal Medicine

## 2021-02-04 VITALS — BP 159/81 | HR 89 | Temp 98.2°F | Wt 203.3 lb

## 2021-02-04 DIAGNOSIS — E1142 Type 2 diabetes mellitus with diabetic polyneuropathy: Secondary | ICD-10-CM | POA: Diagnosis not present

## 2021-02-04 DIAGNOSIS — I10 Essential (primary) hypertension: Secondary | ICD-10-CM

## 2021-02-04 DIAGNOSIS — I5042 Chronic combined systolic (congestive) and diastolic (congestive) heart failure: Secondary | ICD-10-CM

## 2021-02-04 DIAGNOSIS — Z79899 Other long term (current) drug therapy: Secondary | ICD-10-CM

## 2021-02-04 LAB — POCT GLYCOSYLATED HEMOGLOBIN (HGB A1C): Hemoglobin A1C: 12 % — AB (ref 4.0–5.6)

## 2021-02-04 LAB — GLUCOSE, CAPILLARY: Glucose-Capillary: 361 mg/dL — ABNORMAL HIGH (ref 70–99)

## 2021-02-04 MED ORDER — CONTOUR NEXT TEST VI STRP: ORAL_STRIP | 2 refills | Status: DC

## 2021-02-04 MED ORDER — DAPAGLIFLOZIN PROPANEDIOL 10 MG PO TABS: 10.0000 mg | ORAL_TABLET | Freq: Every day | ORAL | 2 refills | Status: DC

## 2021-02-04 MED ORDER — TRESIBA FLEXTOUCH 100 UNIT/ML ~~LOC~~ SOPN
28.0000 [IU] | PEN_INJECTOR | Freq: Every day | SUBCUTANEOUS | 2 refills | Status: DC
Start: 2021-02-04 — End: 2021-06-25

## 2021-02-04 MED ORDER — LOSARTAN POTASSIUM 25 MG PO TABS: 25.0000 mg | ORAL_TABLET | Freq: Every day | ORAL | 2 refills | Status: DC

## 2021-02-04 NOTE — Patient Instructions (Signed)
Ms. SHIRLINE KENDLE,  It was a pleasure to see you today. Thank you for coming in.   Today we discussed your diabetes. Your A1c today is 12, up from 9 last time. This is very elevated. In regards to this please make sure that you are taking your medications as prescribed. Please increase your Wilder Glade to 10 mg daily (from 5 mg daily) Increase your Tyler Aas to 28 units daily.  Continue taking the victoza.  Please stop taking the Synjardy, you should not be on this medication.  Please start checking your blood sugars 3-4 times per day, bring in your meter on your next visit.  I have referred to the pharmacist to make sure we have your correct medications.    We also discussed your blood pressuer. Please continue taking your current medications, I have refilled the losartan for you.   Please return to clinic in 2 weeks or sooner if needed.   Thank you again for coming in.   [name]

## 2021-02-05 LAB — BMP8+ANION GAP
Anion Gap: 17 mmol/L (ref 10.0–18.0)
BUN/Creatinine Ratio: 24 (ref 12–28)
BUN: 23 mg/dL (ref 8–27)
CO2: 23 mmol/L (ref 20–29)
Calcium: 9.4 mg/dL (ref 8.7–10.3)
Chloride: 97 mmol/L (ref 96–106)
Creatinine, Ser: 0.94 mg/dL (ref 0.57–1.00)
Glucose: 362 mg/dL — ABNORMAL HIGH (ref 65–99)
Potassium: 4.2 mmol/L (ref 3.5–5.2)
Sodium: 137 mmol/L (ref 134–144)
eGFR: 67 mL/min/{1.73_m2} (ref >59)

## 2021-02-05 NOTE — Progress Notes (Signed)
Internal Medicine Clinic Attending ° °Case discussed with Dr. Krienke  At the time of the visit.  We reviewed the resident’s history and exam and pertinent patient test results.  I agree with the assessment, diagnosis, and plan of care documented in the resident’s note.  °

## 2021-02-05 NOTE — Assessment & Plan Note (Signed)
She is on [name] 22 units daily, Farxiga 5 mg daily, and liraglutide 1.5 mg weekly. She reports that she has been taking her medications as prescribed, she brought in her prescriptions with her. She states that she lost her meter during her move and had not been able to check her blood sugars often. She states that she has been eating a lot of cookies, drinking juice and spite.She denies nausea, vomiting, chest pain, headaches, diarrhea, constipation, fatigue, or weakness.  Her A1c today is 12, up from 9 on her last check. On review of medication dispense history it looks like insulin degludec was the only medication filled over the past 6 months. She does have all the medications, including Liraglutide, [place] with her right now. She also had Synjardy with her, which is now off her medication list. We discussed that her medications are lasting longer then we would expect. She states that she had multiple 90 day bottles filled and just hasn't picked up anything recently, but reports that she has been taking her medications as prescribed. There is concern about medication non compliance with her. Since she reported that she takes her medications as prescribed, has her medication with her and her blood sugar today is 361 we discussed that we should increase her insulin a bit today. Will consult pharmacy to assist with evaluating for medication adherence, and have close follow up with her monitoring her blood sugars daily and bringing in her meter on her next visit.   -Continue Victoza 1.5 mg weekly -Increase Dapagligflozin to 10 mg daily -Increase insulin to 30 units daily -Refilled testing strips, advised to check 3-4 times per day -Check BMP today -Referral to pharmacy -RTC in 2 weeks

## 2021-02-05 NOTE — Assessment & Plan Note (Signed)
Patient is currently on spironolactone, metoprolol, and lasix. She is supported to be on losartan as well, reports that this is the only medication that she is out of and has not been taking. BP today is elevated at 155/84, above goal. Denies and headaches, nausea, vision changes, chest pain, SOB, or palpitations.   --Continue home medications -Refill losartan -BMP today

## 2021-02-18 ENCOUNTER — Encounter: Payer: Self-pay | Admitting: Internal Medicine

## 2021-02-18 ENCOUNTER — Ambulatory Visit (INDEPENDENT_AMBULATORY_CARE_PROVIDER_SITE_OTHER): Payer: Medicare Other | Admitting: Internal Medicine

## 2021-02-18 ENCOUNTER — Ambulatory Visit: Payer: Medicare Other | Admitting: Pharmacist

## 2021-02-18 DIAGNOSIS — I10 Essential (primary) hypertension: Secondary | ICD-10-CM | POA: Diagnosis not present

## 2021-02-18 DIAGNOSIS — Z79899 Other long term (current) drug therapy: Secondary | ICD-10-CM

## 2021-02-18 DIAGNOSIS — E1142 Type 2 diabetes mellitus with diabetic polyneuropathy: Secondary | ICD-10-CM

## 2021-02-18 MED ORDER — PEN NEEDLES 32G X 4 MM MISC: 3 refills | Status: DC

## 2021-02-18 MED ORDER — FUROSEMIDE 40 MG PO TABS: 40.0000 mg | ORAL_TABLET | Freq: Two times a day (BID) | ORAL | 5 refills | Status: DC

## 2021-02-18 MED ORDER — ANORO ELLIPTA 62.5-25 MCG/INH IN AEPB: 1.0000 | INHALATION_SPRAY | Freq: Every day | RESPIRATORY_TRACT | 3 refills | Status: DC

## 2021-02-18 MED ORDER — INSULIN ASPART 100 UNIT/ML FLEXPEN: 6.0000 [IU] | PEN_INJECTOR | Freq: Two times a day (BID) | SUBCUTANEOUS | 1 refills | Status: DC

## 2021-02-18 NOTE — Patient Instructions (Signed)
Ms. Curnow it was a pleasure seeing you today.   Today we reviewed all of the medications you are currently taking. Included is an updated medication list. Please continue taking all medications as prescribed on this list.  To help you remember to take your medications:  - Supplied you with a pill organizer - Will place atorvastatin next to bedside table  If you have any questions please call the clinic and ask to speak with me.  Follow-up with PCP at next appointment

## 2021-02-18 NOTE — Patient Instructions (Signed)
Ms. ALEIGHA GILANI,  It was a pleasure to see you today. Thank you for coming in.   Today we discussed your diabetes.  I am glad that you seem to be doing better with this.  Please continue taking the Victoza, Tresiba 28 units daily, and Farxiga 10 mg daily.  I have added a medication called NovoLog, please take 60 units with your 2 largest meals.  Continue checking her blood sugars as you have been.  Continue working on [employer] and exercise.  Please make an appointment to see the eye doctor.  We also discussed your blood pressure.  This looks good today.  Please continue your current medications.  Please return to clinic in 1 month or sooner if needed.   Thank you again for coming in.   [name]

## 2021-02-18 NOTE — Assessment & Plan Note (Signed)
A1c on 5/5 was 12, up from 9.1 on 10/21.  Currently on Tresiba 28 units daily, Farxiga 10 mg daily, Victoza 1.5 mg daily. There is question of medication noncompliance on her last visit due to not having all her medications filled, have referred to pharmacy on her last visit.  On medication dispense history Victoza is not noted, however she did fill out the [place] 10 mg daily.  Today she reports that she has been taking the [name] 28 units daily, Farxiga 10 mg daily, and Victoza, she has all these medications with her.  She brought in her glucometer readings, with readings have ranged around 96-2 37, often with higher readings in the evening time.  She denies any polyuria, polydipsia, abdominal pain, nausea, chest pain, or other symptoms.  We discussed that her CBG readings have been doing better however they are still elevated, we discussed adding a short acting insulin and she states that she should not have any issues doing that.  -Continue Tresiba 28 units daily -Continue Farxiga 10 mg daily -Continue Victoza 1.5 mg daily -Add NovoLog 6 units twice daily with 2 largest meals -RTC in 1 month, advised to bring in all medications and her glucometer

## 2021-02-18 NOTE — Progress Notes (Signed)
   Subjective/Objective:    Patient ID: [name], female    DOB: August 19, 1951, 70 y.o.   MRN: [id number]  HPI Patient is a 70 y.o. female who presents for medication review and management.  She is in good spirits and presents without assistance. Patient was referred on and last seen by provider, Dr. Sherry Ruffing, before appt today.  Patient brought in medications and the following issue was noted: Patient reports taking furosemide 80mg  BID, med list stated furosemide 180mg  BID, and recent provider notes stated furosemide 40mg  BID. Patient states she thought she was supposed to be taking the bottle that she presented, which was 80mg  BID, and that she has been taking it at this dose regularly for awhile.  Medication Adherence Questionnaire (A score of 2 or more points indicates risk for nonadherence)  Do you know what each of your medicines is for? 0 (1 point if no)  Do you ever have trouble remembering to take your medicine? 2; atorvastatin at night (2 points if yes)  Do you ever not take a medicine because you feel you do not need it?  0 (1 point if yes)  Do you think that any of your medicines is not helping you? 0 (1 point if yes)  Do you have any physical problems such as vision loss that keep you from taking your medicines as prescribed?  0 (2 points if yes)  Do you think any of your medicine is causing a side effect?  0 (1 point if yes)  Do you know the names of ALL of your medicines? 0 (1 point if no)  Do you think that you need ALL of your medicines? 0 (1 point if no)  In the past 6 months, have you missed getting a refill or a new prescription filled on time?  0 (1 point if yes)  How often do you miss taking a dose of medicine? 3 Never (0 points), 1 or 2 times a month (1 points), 1 time a week (2 points), 2 or more times a week (3 points).   TOTAL SCORE 5/14   Assessment/Plan:    Understanding of regimen: excellent  Understanding of indications: excellent  Potential of compliance:  good  Patient has known adherence challenges based on score of 5 for questionnaire. Barriers include: forgetfulness. Medication list reviewed and updated. Discussed with Dr. Sherry Ruffing furosemide prescription. Dr. Sherry Ruffing sent in new prescription for furosemide 40mg  BID which I discussed with patient to take. Patient requested that I do not dispose of her current bottle, but that she will stop taking that bottle and dispose of it when she picks up her new prescription. Patient also agreed to keep atorvastatin near her bedside table to remind her to take it nightly and improve on adherence. Patient was provided with a printed medication list and pill organizer.  Follow-up with PCP at next scheduled appt. Written patient instructions provided.  This appointment required 40 minutes of patient care (this includes precharting, chart review, review of results, and face-to-face care).  Thank you for involving pharmacy to assist in providing this patient's care.

## 2021-02-18 NOTE — Assessment & Plan Note (Signed)
Currently on spironolactone 12.5 mg daily, metoprolol 100 mg daily, Lasix 40 mg twice daily, and losartan 50 mg daily(this was restarted on her last visit due to running out).  She reports taking these medications as prescribed, except for the Lasix which she has been taking 80 mg twice a day. BMP on 5/5 showed normal potassium and kidney function.  BP Readings from Last 3 Encounters:  02/18/21 126/63  02/04/21 (!) 159/81  09/21/20 134/68    -Continue current medications, decrease Lasix to 40 mg twice a day as prescribed -Follow-up in 1 month

## 2021-02-18 NOTE — Progress Notes (Signed)
CC: Diabetes, hypertension  HPI:  [name] is a 70 y.o. with the history listed below presenting for follow-up of her hypertension and diabetes.  Past Medical History:  Diagnosis Date  . ATN (acute tubular necrosis) ([place]) 07/15/2014  . Automatic implantable cardioverter-defibrillator in situ   . Automatic implantable cardioverter-defibrillator in situ 05/04/2007   Qualifier: Diagnosis of  By: Isla Pence    . Cardiac defibrillator in situ    Atlas II VR (SJM) implanted by Dr Lovena Le  . CHF (congestive heart failure) ([place])   . Chronic combined systolic and diastolic heart failure (HCC)    a. EF 35-40% in past;  b. Echo 7/13:  EF 45-50%, Gr 2 diast dysfn, mild AI, mild MAC, trivial MR, mild LAE, PASP 47.  [name] Chronic ulcer of leg ([place])    04-09-15 resolved-not a problem.  . Colon polyps 04/12/2013   Rectosigmoid polyp  . COPD (chronic obstructive pulmonary disease) (HCC)    O2 at night  . Depression   . Diabetes mellitus   . Diabetes mellitus, type 2 ([place]) 04/03/2012   HIGH RISK FEET.. Please have patient take shoes and socks off every visit for visual foot inspection.    . Eczema   . Elevated alkaline phosphatase level    GGT and 5'nucleotidase 8/13 normal  . Health care maintenance 01/22/2013   Surgically absent cervix- no pap needed (Path report 07/2000: uterine body with attached bilateral adnexa and separate cervix.)  . History of oxygen administration    oxygen @ 2 l/m nasally bedtime 24/7  . HTN (hypertension)   . Hx of cardiac cath    a. [place] 2003 normal;  b. LHC 6/13:  Mild calcification in the LM, o/w normal coronary arteries, EF 45%.   . Hyperkalemia 08/08/2017  . Hyperlipidemia    Elevated triglyceride in 2019  . Hyperlipidemia associated with type 2 diabetes mellitus ([place]) 05/25/2007   Qualifier: Diagnosis of  By: Hassell Done FNP, Tori Milks    . Hyperthyroidism, subclinical   . HYPERTHYROIDISM, SUBCLINICAL 05/06/2009   Qualifier: Diagnosis of  By: Darrick Meigs     . Implantable cardioverter-defibrillator (ICD) generator end of life   . Lipoma   . Need for hepatitis C screening test 04/25/2019  . NICM (nonischemic cardiomyopathy) ([place])   . Obesity   . On home oxygen therapy    "2L; 24/7" (10/11/2016)  . Post menopausal syndrome   . Shortness of breath 07/14/2017  . Skin rash 07/12/2018  . Sleep apnea    pt denies 04/12/2013   Review of Systems:   Constitutional: Negative for chills and fever.  Respiratory: Negative for shortness of breath.   Cardiovascular: Negative for chest pain and leg swelling.  Gastrointestinal: Negative for abdominal pain, nausea and vomiting.  Neurological: Negative for dizziness and headaches.    Physical Exam:  Vitals:   02/18/21 0939  BP: 126/63  Pulse: 85  Temp: 97.8 F (36.6 C)  TempSrc: Oral  SpO2: 100%  Weight: 200 lb 9.6 oz (91 kg)   Physical Exam HENT:     Head: Normocephalic and atraumatic.  Eyes:     Conjunctiva/sclera: Conjunctivae normal.     Pupils: Pupils are equal, round, and reactive to light.  Neck:     Thyroid: No thyromegaly.  Cardiovascular:     Rate and Rhythm: Normal rate and regular rhythm.     Heart sounds: Normal heart sounds. No murmur heard. No friction rub. No gallop.   Pulmonary:  Effort: Pulmonary effort is normal. No respiratory distress.     Breath sounds: Normal breath sounds. No wheezing.  Abdominal:     General: Bowel sounds are normal. There is no distension.     Palpations: Abdomen is soft.  Musculoskeletal:        General: Normal range of motion.     Cervical back: Normal range of motion and neck supple.  Neurological:     Mental Status: She is alert and oriented to person, place, and time.     Gait: Gait is intact.  Psychiatric:        Mood and Affect: Mood and affect normal.      Assessment & Plan:   See Encounters Tab for problem based charting.  Patient discussed with Dr. Evette Doffing

## 2021-02-22 ENCOUNTER — Telehealth: Payer: Self-pay

## 2021-02-22 ENCOUNTER — Encounter: Payer: Self-pay | Admitting: Dietician

## 2021-02-22 ENCOUNTER — Encounter: Payer: Self-pay | Admitting: *Deleted

## 2021-02-22 NOTE — Telephone Encounter (Signed)
Pt insurance will not pay for the Novolog need another medicine with prescription; pls contact [phone]

## 2021-02-22 NOTE — Progress Notes (Signed)
Internal Medicine Clinic Attending ° °Case discussed with Dr. Krienke  At the time of the visit.  We reviewed the resident’s history and exam and pertinent patient test results.  I agree with the assessment, diagnosis, and plan of care documented in the resident’s note.  °

## 2021-02-22 NOTE — Telephone Encounter (Signed)
Called pt for more details - no answer.

## 2021-02-23 NOTE — Telephone Encounter (Addendum)
I called [facility] - stated pt's insurance will not pay for Novolog- the pharmacist stated it will pay for Humalog. Send new rx if appropriate. Thanks

## 2021-02-24 MED ORDER — INSULIN LISPRO 100 UNIT/ML IJ SOLN: 6.0000 [IU] | Freq: Two times a day (BID) | INTRAMUSCULAR | 1 refills | Status: DC

## 2021-02-24 NOTE — Telephone Encounter (Signed)
Called pt to let her know of change in medication to Humalog - no answer; left message on vm  "new med at the pharmacy".

## 2021-02-26 DIAGNOSIS — J449 Chronic obstructive pulmonary disease, unspecified: Secondary | ICD-10-CM | POA: Diagnosis not present

## 2021-02-26 DIAGNOSIS — I509 Heart failure, unspecified: Secondary | ICD-10-CM | POA: Diagnosis not present

## 2021-03-18 ENCOUNTER — Ambulatory Visit
Admission: RE | Admit: 2021-03-18 | Discharge: 2021-03-18 | Disposition: A | Discharge status: Home / Self Care | Payer: Medicare Other | Source: Ambulatory Visit | Attending: Internal Medicine | Admitting: Internal Medicine

## 2021-03-18 ENCOUNTER — Other Ambulatory Visit: Payer: Self-pay

## 2021-03-18 DIAGNOSIS — Z1231 Encounter for screening mammogram for malignant neoplasm of breast: Secondary | ICD-10-CM

## 2021-03-22 ENCOUNTER — Encounter: Payer: Medicare Other | Admitting: Internal Medicine

## 2021-03-29 DIAGNOSIS — I509 Heart failure, unspecified: Secondary | ICD-10-CM | POA: Diagnosis not present

## 2021-03-29 DIAGNOSIS — J449 Chronic obstructive pulmonary disease, unspecified: Secondary | ICD-10-CM | POA: Diagnosis not present

## 2021-04-28 ENCOUNTER — Other Ambulatory Visit: Payer: Self-pay | Admitting: *Deleted

## 2021-04-28 DIAGNOSIS — J449 Chronic obstructive pulmonary disease, unspecified: Secondary | ICD-10-CM | POA: Diagnosis not present

## 2021-04-28 DIAGNOSIS — I509 Heart failure, unspecified: Secondary | ICD-10-CM | POA: Diagnosis not present

## 2021-04-28 MED ORDER — ALBUTEROL SULFATE HFA 108 (90 BASE) MCG/ACT IN AERS: 2.0000 | INHALATION_SPRAY | Freq: Four times a day (QID) | RESPIRATORY_TRACT | 2 refills | Status: DC | PRN

## 2021-05-12 ENCOUNTER — Other Ambulatory Visit: Payer: Self-pay

## 2021-05-12 ENCOUNTER — Ambulatory Visit (INDEPENDENT_AMBULATORY_CARE_PROVIDER_SITE_OTHER): Payer: Medicare Other

## 2021-05-12 DIAGNOSIS — I5042 Chronic combined systolic (congestive) and diastolic (congestive) heart failure: Secondary | ICD-10-CM

## 2021-05-12 LAB — CUP PACEART INCLINIC DEVICE CHECK
Battery Remaining Longevity: 46 mo
Brady Statistic RV Percent Paced: 0 %
Date Time Interrogation Session: 20220810090816
HighPow Impedance: 49.1786
Implantable Lead Implant Date: 20080801
Implantable Lead Location: 753860
Implantable Lead Model: 7120
Implantable Pulse Generator Implant Date: 20150701
Lead Channel Impedance Value: 425 Ohm
Lead Channel Pacing Threshold Amplitude: 0.5 V
Lead Channel Pacing Threshold Pulse Width: 0.5 ms
Lead Channel Sensing Intrinsic Amplitude: 11.9 mV
Lead Channel Setting Pacing Amplitude: 2.5 V
Lead Channel Setting Pacing Pulse Width: 0.5 ms
Lead Channel Setting Sensing Sensitivity: 0.5 mV
Pulse Gen Serial Number: 7206538

## 2021-05-12 NOTE — Progress Notes (Signed)
ICD check in device clinic for 6 month follow up. Normal device function. Thresholds and sensing consistent with previous device measurements. Impedance trends stable over time. 4 NSVT episodes of different morphology noted with most recent 04/22/21. Longest 18 beats. Reviewed with Dr. Lovena Le. Histogram distribution appropriate for patient and level of activity. No changes made this session. Device programmed at appropriate safety margins. Device programmed to optimize intrinsic conduction. Estimated longevity 3 years, 10 months. Pt declines enrollment in remote follow-up. Recall for yearly visit with Dr. Lovena Le 11/15/20 noted. Patient education completed including shock plan.

## 2021-05-19 ENCOUNTER — Encounter: Payer: Medicare Other | Admitting: Internal Medicine

## 2021-05-24 ENCOUNTER — Ambulatory Visit (INDEPENDENT_AMBULATORY_CARE_PROVIDER_SITE_OTHER): Payer: Medicare Other | Admitting: Internal Medicine

## 2021-05-24 ENCOUNTER — Encounter: Payer: Self-pay | Admitting: Internal Medicine

## 2021-05-24 ENCOUNTER — Other Ambulatory Visit: Payer: Self-pay

## 2021-05-24 VITALS — BP 157/79 | HR 80 | Temp 98.0°F | Resp 28 | Ht 61.0 in | Wt 202.2 lb

## 2021-05-24 DIAGNOSIS — I5042 Chronic combined systolic (congestive) and diastolic (congestive) heart failure: Secondary | ICD-10-CM | POA: Diagnosis not present

## 2021-05-24 DIAGNOSIS — I10 Essential (primary) hypertension: Secondary | ICD-10-CM

## 2021-05-24 DIAGNOSIS — F17200 Nicotine dependence, unspecified, uncomplicated: Secondary | ICD-10-CM

## 2021-05-24 DIAGNOSIS — E1142 Type 2 diabetes mellitus with diabetic polyneuropathy: Secondary | ICD-10-CM

## 2021-05-24 LAB — POCT GLYCOSYLATED HEMOGLOBIN (HGB A1C): Hemoglobin A1C: 9.4 % — AB (ref 4.0–5.6)

## 2021-05-24 LAB — GLUCOSE, CAPILLARY: Glucose-Capillary: 350 mg/dL — ABNORMAL HIGH (ref 70–99)

## 2021-05-24 MED ORDER — BUPROPION HCL ER (XL) 150 MG PO TB24: ORAL_TABLET | ORAL | 0 refills | Status: DC

## 2021-05-24 NOTE — Patient Instructions (Addendum)
Thank you, [name] for allowing [place] to provide your care today. Today we discussed Diabetes, blood pressure.    Labs Ordered:  Lab Orders         Glucose, capillary         POC Hbg A1C      Tests Ordered: Orders Placed This Encounter  Procedures   Glucose, capillary   POC Hbg A1C      Referrals Ordered:  Referral Orders  No referral(s) requested today     Medication Changes:   Meds ordered this encounter  Medications   buPROPion (WELLBUTRIN XL) 150 MG 24 hr tablet    Sig: Take 1 tablet (150 mg total) by mouth daily for 3 days, THEN 1 tablet (150 mg total) in the morning and at bedtime for 27 days.    Dispense:  57 tablet    Refill:  0     Health Maintenance Screening: Diabetes Health Maintenance Due  Topic Date Due   LIPID PANEL  04/29/2021   FOOT EXAM  07/27/2021   OPHTHALMOLOGY EXAM  08/18/2021   HEMOGLOBIN A1C  08/24/2021     Instructions:  - Please weigh yourself daily. - Please take your blood pressure once daily - Please record you blood sugar 3 times daily (first thing in the morning, midday, and night) - Avoid processed food and soda.  Follow up: telehealth appointment in 1 week, then  1-2 months    Remember: If you have any questions or concerns, call our clinic at [phone] or after hours call [phone] and ask for the internal medicine resident on call.  Marianna Payment, D.O. [place]

## 2021-05-24 NOTE — Progress Notes (Signed)
CC: DM  HPI:  [name] is a 70 y.o. female with a past medical history stated below and presents today for DM. Please see problem based assessment and plan for additional details.  Past Medical History:  Diagnosis Date   ATN (acute tubular necrosis) ([place]) 07/15/2014   Automatic implantable cardioverter-defibrillator in situ    Automatic implantable cardioverter-defibrillator in situ 05/04/2007   Qualifier: Diagnosis of  By: Isla Pence     Cardiac defibrillator in situ    Atlas II VR ([facility]) implanted by Dr Lovena Le   CHF (congestive heart failure) ([place])    Chronic combined systolic and diastolic heart failure ([place])    a. EF 35-40% in past;  b. Echo 7/13:  EF 45-50%, Gr 2 diast dysfn, mild AI, mild MAC, trivial MR, mild LAE, PASP 47.   Chronic ulcer of leg ([place])    04-09-15 resolved-not a problem.   Colon polyps 04/12/2013   Rectosigmoid polyp   COPD (chronic obstructive pulmonary disease) (H[facility])    O2 at night   Depression    Diabetes mellitus    Diabetes mellitus, type 2 ([place]) 04/03/2012   HIGH RISK FEET.. Please have patient take shoes and socks off every visit for visual foot inspection.     Eczema    Elevated alkaline phosphatase level    GGT and 5'nucleotidase 8/13 normal   Health care maintenance 01/22/2013   Surgically absent cervix- no pap needed (Path report 07/2000: uterine body with attached bilateral  adnexa and separate cervix.)   History of oxygen administration    oxygen @ 2 l/m nasally bedtime 24/7   HTN (hypertension)    Hx of cardiac cath    a. [place] 2003 normal;  b. [facility] 6/13:  Mild calcification in the LM, o/w normal coronary arteries, EF 45%.    Hyperkalemia 08/08/2017   Hyperlipidemia    Elevated triglyceride in 2019   Hyperlipidemia associated with type 2 diabetes mellitus ([place]) 05/25/2007   Qualifier: Diagnosis of  By: Hassell Done FNP, [place]     Hyperthyroidism, subclinical    HYPERTHYROIDISM, SUBCLINICAL 05/06/2009   Qualifier: Diagnosis of  By:  Darrick Meigs     Implantable cardioverter-defibrillator (ICD) generator end of life    Lipoma    Need for hepatitis C screening test 04/25/2019   NICM (nonischemic cardiomyopathy) ([place])    Obesity    On home oxygen therapy    "2L; 24/7" (10/11/2016)   Post menopausal syndrome    Shortness of breath 07/14/2017   Skin rash 07/12/2018   Sleep apnea    pt denies 04/12/2013    Current Outpatient Medications on File Prior to Visit  Medication Sig Dispense Refill   albuterol (VENTOLIN HFA) 108 (90 Base) MCG/ACT inhaler Inhale 2 puffs into the lungs every 6 (six) hours as needed for wheezing or shortness of breath. 8 g 2   aspirin EC 81 MG tablet Take 1 tablet (81 mg total) by mouth 2 (two) times daily. 84 tablet 0   atorvastatin (LIPITOR) 80 MG tablet Take 1 tablet (80 mg total) by mouth at bedtime. 90 tablet 3   Blood Glucose Monitoring Suppl (ACCU-CHEK GUIDE ME) w/Device KIT by Does not apply route.     calcium-vitamin D (OSCAL WITH D) 500-200 MG-UNIT tablet Take 1 tablet by mouth 2 (two) times daily. 90 tablet 6   clobetasol ointment (TEMOVATE) 4.66 % Apply 1 application topically 2 (two) times daily as needed (Skin rash).      dapagliflozin propanediol (  FARXIGA) 10 MG TABS tablet Take 1 tablet (10 mg total) by mouth daily. 30 tablet 2   furosemide (LASIX) 40 MG tablet Take 1 tablet (40 mg total) by mouth 2 (two) times daily. 60 tablet 5   insulin degludec (TRESIBA FLEXTOUCH) 100 UNIT/ML FlexTouch Pen Inject 28 Units into the skin daily. 15 mL 2   insulin lispro (HUMALOG) 100 UNIT/ML injection Inject 0.06 mLs (6 Units total) into the skin 2 (two) times daily with a meal. 6 mL 1   Insulin Pen Needle (PEN NEEDLES) 32G X 4 MM MISC Use to inject insulin 3 times times per day 100 each 3   Lancets Misc. (ACCU-CHEK FASTCLIX LANCET) KIT Lancing device, use with lancets for blood glucose monitoring. 1 kit 0   Lancets MISC 1 Act by Does not apply route 4 (four) times daily. 200 each 3   liraglutide  (VICTOZA) 18 MG/3ML SOPN Inject 1.100m daily into the skin (Patient taking differently: Inject 1.8 mg into the skin daily. Inject 1.837mdaily into the skin) 30 mL 3   losartan (COZAAR) 25 MG tablet Take 1 tablet (25 mg total) by mouth daily. 30 tablet 2   metoprolol succinate (TOPROL-XL) 50 MG 24 hr tablet Take 2 tablets (100 mg total) by mouth daily. 180 tablet 3   nitroGLYCERIN (NITROSTAT) 0.4 MG SL tablet Place 1 tablet (0.4 mg total) under the tongue every 5 (five) minutes as needed for chest pain. Reported on 03/18/2016 (Patient not taking: Reported on 02/18/2021) 25 tablet 3   spironolactone (ALDACTONE) 25 MG tablet TAKE 0.5 TABLETS BY MOUTH EVERY DAY (Patient taking differently: Take 12.5 mg by mouth daily.) 45 tablet 3   umeclidinium-vilanterol (ANORO ELLIPTA) 62.5-25 MCG/INH AEPB Inhale 1 puff into the lungs daily. 1 each 3   No current facility-administered medications on file prior to visit.    Family History  Problem Relation Age of Onset   Stroke Mother    Seizures Father    Heart disease Father    Diabetes Sister    Asthma Maternal Aunt        aunts   Asthma Maternal Uncle        uncles   Heart disease Paternal Aunt        aunts   Heart disease Paternal Uncle        uncles   Heart disease Maternal Aunt        aunts   Heart disease Maternal Uncle        uncles   Heart disease Maternal Grandfather    Breast cancer Daughter    Breast cancer Cousin    Asthma Grandchild    Colon cancer Neg Hx    Colon polyps Neg Hx    Esophageal cancer Neg Hx    Kidney disease Neg Hx    Gallbladder disease Neg Hx     Social History   Socioeconomic History   Marital status: Widowed    Spouse name: [name] Number of children: 3   Years of education: 11   Highest education level: Not on file  Occupational History   Occupation: disabled  Tobacco Use   Smoking status: Every Day    Packs/day: 0.50    Years: 45.00    Pack years: 22.50    Types: Cigarettes   Smokeless tobacco:  Never   Tobacco comments:    currently smoking 3-4 cigs per day  Vaping Use   Vaping Use: Never used  Substance and Sexual Activity  Alcohol use: No    Alcohol/week: 1.0 standard drink    Types: 1 Glasses of wine per week   Drug use: No   Sexual activity: Not Currently  Other Topics Concern   Not on file  Social History Narrative   ** Merged History Encounter **       Married   Patient enjoys going to [employer], church and spending time with family   Social Determinants of Health   Financial Resource Strain: Not on file  Food Insecurity: Not on file  Transportation Needs: Not on file  Physical Activity: Not on file  Stress: Not on file  Social Connections: Not on file  Intimate Partner Violence: Not on file    Review of Systems: ROS negative except for what is noted on the assessment and plan.  Vitals:   05/24/21 0907 05/24/21 0956  BP: (!) 162/89 (!) 157/79  Pulse: 95 80  Resp: (!) 28   Temp: 98 F (36.7 C)   TempSrc: Oral   SpO2: 97%   Weight: 202 lb 3.2 oz (91.7 kg)   Height: _0  (1.549 m)     Physical Exam: Gen: A&O x3 and in no apparent distress, well appearing and nourished. HEENT: Head - normocephalic, atraumatic. Eye -  visual acuity grossly intact, conjunctiva clear, sclera non-icteric, EOM intact. Mouth - No obvious caries or periodontal disease. Neck: no obvious masses or nodules, AROM intact. No obvious elevation ion JVD CV: RRR, holosystolic murmur, 1+ pitting edema in bilateral lower extremities.  Resp: Clear to ascultation bilaterally  Abd: BS (+) x4, soft, non-tender, without obvious hepatosplenomegaly or masses MSK: Grossly normal AROM and strength x4 extremities. Skin: good skin turgor, no rashes, unusual bruising, or prominent lesions.  Neuro: No focal deficits, grossly normal sensation and coordination.  Psych: Oriented x3 and responding appropriately. Intact recent and remote memory, normal mood, judgement, affect , and insight.     Assessment & Plan:   See Encounters Tab for problem based charting.  Patient discussed with Dr. Lars Mage, D.O. [facility], PGY-3 Pager: [phone], Phone: [phone] Date 05/25/2021 Time 6:15 AM

## 2021-05-25 ENCOUNTER — Encounter: Payer: Self-pay | Admitting: Internal Medicine

## 2021-05-25 NOTE — Assessment & Plan Note (Signed)
Assessment: Patient presents today for further evaluation and management of her heart failure with reduced ejection fraction (EF25%).  Patient states she follows with Dr. Lovena Le with cardiology.  She states that she sees Dr. Lovena Le once yearly and goes and frequently for ICD checks.  She endorses shortness of breath at baseline, but she denies any signs or symptoms of CHF exacerbation.  She is currently on furosemide 40 mg twice daily, losartan 25 mg daily, metoprolol succinate 100 mg, dapagliflozin 10 mg daily, spironolactone 25 mg daily.  She states that she does not weigh herself daily and therefore, she does not know her dry weight.  Current weight today is 202 pounds.  On exam there is no evidence of hypervolemia other than some mild lower extremity pitting edema bilaterally  Her blood pressure is elevated today at 162/89.  She states that this is secondary to not taking her blood pressure medications this morning.   Plan: -Continue current CHF medications -Discussed weighing yourself daily -Discussed keeping a blood pressure log to make adjustments at a telehealth appointment in 1 week

## 2021-05-25 NOTE — Assessment & Plan Note (Signed)
Assessment: Presents for further evaluation management of hypertension.  Blood pressure today is 162/89.  She states that her blood pressure is elevated today secondary to not taking her morning medications.  Current medications: Furosemide 40 mg BID, losartan 25 mg, metoprolol succinate 100 mg, dapaglifolozin 10 mg, spironolactone 25 mg.  Recent blood pressures:  BP Readings from Last 3 Encounters:  05/24/21 (!) 157/79  02/18/21 126/63  02/04/21 (!) 159/81    Plan: -Asked patient to keep a blood pressure log daily for 1 week -Follow up telehealth appointment in 1 week to adjust blood pressure medications

## 2021-05-25 NOTE — Assessment & Plan Note (Signed)
Assessment: Patient states that she smokes 1/5 packs/day. She has been in the contemplation stage of quitting prior to today's appointment. She is whiling to quit smoking. She  believes her barriers to quitting are the following: fear of failing again.  Through shared decision-making we set a smoking/tobacco cessation date of November 30th   Plan: 1. Counseling/education: I advised patient to quit, and offered support. Offered medication assistance and tobacco replacement.  2. Screening: none 3. Medication management: Wellbutrin 150 mg BID  4. Follow up: 1-2 month

## 2021-05-25 NOTE — Assessment & Plan Note (Signed)
Assessment: Patient presents for further evaluation and management of her diabetes.  Her CBG today is 350 which she attributes to recently drinking a soda.  Her A1c today is 9.4 which is improved from 12.03 months prior.  She is currently on the following medications: 1. Humalog 6 units 3 times daily, 2. Tresiba 22 units daily (writtenas  28 units daily)  3. dapagliflozin 10 mg daily 4. liraglutide 1.8 mg daily  She states that she takes her blood sugar twice daily, first thing in the morning and at night.  Although she did not bring her blood sugar log with her today.  She does endorse a couple episodes of low blood sugars in the morning around 70 with mild symptoms of hypoglycemia.   Plan: -I counseled her on signs and symptoms to pay attention to for hypoglycemia and how to manage the symptoms. We discussed dietary changes including avoiding high carbohydrate load, processed foods, and sodas. Furthermore, we discussed the importance of having a consistent diet for managing her blood sugar. She agreed to keeping a log of her blood sugars 3 times daily and follow-up in 1 week via telehealth appointment to adjust medications. -Continue current diabetic medications -Blood sugar log 3 times daily -Dietary changes -Follow-up in 1 week via telehealth

## 2021-05-27 NOTE — Progress Notes (Signed)
Internal Medicine Clinic Attending  Case discussed with Dr. Coe  At the time of the visit.  We reviewed the resident's history and exam and pertinent patient test results.  I agree with the assessment, diagnosis, and plan of care documented in the resident's note.  

## 2021-05-29 DIAGNOSIS — I509 Heart failure, unspecified: Secondary | ICD-10-CM | POA: Diagnosis not present

## 2021-05-29 DIAGNOSIS — J449 Chronic obstructive pulmonary disease, unspecified: Secondary | ICD-10-CM | POA: Diagnosis not present

## 2021-05-31 ENCOUNTER — Ambulatory Visit (INDEPENDENT_AMBULATORY_CARE_PROVIDER_SITE_OTHER): Payer: Medicare Other | Admitting: Internal Medicine

## 2021-05-31 ENCOUNTER — Encounter: Payer: Self-pay | Admitting: Internal Medicine

## 2021-05-31 DIAGNOSIS — I10 Essential (primary) hypertension: Secondary | ICD-10-CM

## 2021-05-31 DIAGNOSIS — E1142 Type 2 diabetes mellitus with diabetic polyneuropathy: Secondary | ICD-10-CM

## 2021-05-31 NOTE — Progress Notes (Signed)
Patient was scheduled for a telehealth appointment today to reevaluate her blood pressure and blood sugar logs.  Patient states that she has had 2 recent deaths in her family over the past week.  Therefore, we agreed to reschedule this appointment for later this week.     Lawerance Cruel, D.O.  Internal Medicine Resident, PGY-3 [facility]  Pager: [phone] 8:51 AM, 05/31/2021

## 2021-06-09 ENCOUNTER — Ambulatory Visit (INDEPENDENT_AMBULATORY_CARE_PROVIDER_SITE_OTHER): Payer: Medicare Other | Admitting: Internal Medicine

## 2021-06-09 DIAGNOSIS — E1142 Type 2 diabetes mellitus with diabetic polyneuropathy: Secondary | ICD-10-CM

## 2021-06-09 DIAGNOSIS — I10 Essential (primary) hypertension: Secondary | ICD-10-CM

## 2021-06-09 NOTE — Progress Notes (Signed)
Reached out to patient this AM for follow up on her visit from 05/25/2021 for her CBGs and hypertension. I was able to reach [name], but she stated that she is busy at this time. Will attempt to call back as time allows, if unable to contact we will need to reschedule her for another telehealth visit.

## 2021-06-16 ENCOUNTER — Other Ambulatory Visit: Payer: Self-pay

## 2021-06-16 ENCOUNTER — Other Ambulatory Visit (HOSPITAL_COMMUNITY): Payer: Self-pay

## 2021-06-16 ENCOUNTER — Encounter (HOSPITAL_COMMUNITY): Payer: Self-pay | Admitting: Emergency Medicine

## 2021-06-16 ENCOUNTER — Emergency Department (HOSPITAL_COMMUNITY): Payer: Medicare Other

## 2021-06-16 ENCOUNTER — Inpatient Hospital Stay (HOSPITAL_COMMUNITY)
Admission: EM | Admit: 2021-06-16 | Discharge: 2021-06-18 | DRG: 291 | Disposition: A | Discharge status: Home / Self Care | Payer: Medicare Other | Attending: Internal Medicine | Admitting: Internal Medicine

## 2021-06-16 DIAGNOSIS — I5043 Acute on chronic combined systolic (congestive) and diastolic (congestive) heart failure: Secondary | ICD-10-CM | POA: Diagnosis present

## 2021-06-16 DIAGNOSIS — I428 Other cardiomyopathies: Secondary | ICD-10-CM | POA: Diagnosis not present

## 2021-06-16 DIAGNOSIS — Z803 Family history of malignant neoplasm of breast: Secondary | ICD-10-CM

## 2021-06-16 DIAGNOSIS — I110 Hypertensive heart disease with heart failure: Secondary | ICD-10-CM | POA: Diagnosis not present

## 2021-06-16 DIAGNOSIS — R739 Hyperglycemia, unspecified: Secondary | ICD-10-CM | POA: Diagnosis not present

## 2021-06-16 DIAGNOSIS — E1142 Type 2 diabetes mellitus with diabetic polyneuropathy: Secondary | ICD-10-CM

## 2021-06-16 DIAGNOSIS — I5023 Acute on chronic systolic (congestive) heart failure: Secondary | ICD-10-CM | POA: Diagnosis not present

## 2021-06-16 DIAGNOSIS — E1143 Type 2 diabetes mellitus with diabetic autonomic (poly)neuropathy: Secondary | ICD-10-CM | POA: Diagnosis not present

## 2021-06-16 DIAGNOSIS — F32A Depression, unspecified: Secondary | ICD-10-CM | POA: Diagnosis not present

## 2021-06-16 DIAGNOSIS — Z9981 Dependence on supplemental oxygen: Secondary | ICD-10-CM

## 2021-06-16 DIAGNOSIS — Z8719 Personal history of other diseases of the digestive system: Secondary | ICD-10-CM

## 2021-06-16 DIAGNOSIS — Z7984 Long term (current) use of oral hypoglycemic drugs: Secondary | ICD-10-CM

## 2021-06-16 DIAGNOSIS — Z79899 Other long term (current) drug therapy: Secondary | ICD-10-CM | POA: Diagnosis not present

## 2021-06-16 DIAGNOSIS — J849 Interstitial pulmonary disease, unspecified: Secondary | ICD-10-CM

## 2021-06-16 DIAGNOSIS — E875 Hyperkalemia: Secondary | ICD-10-CM | POA: Diagnosis present

## 2021-06-16 DIAGNOSIS — J9621 Acute and chronic respiratory failure with hypoxia: Secondary | ICD-10-CM | POA: Diagnosis present

## 2021-06-16 DIAGNOSIS — Z6836 Body mass index (BMI) 36.0-36.9, adult: Secondary | ICD-10-CM | POA: Diagnosis not present

## 2021-06-16 DIAGNOSIS — I700 Atherosclerosis of aorta: Secondary | ICD-10-CM | POA: Diagnosis present

## 2021-06-16 DIAGNOSIS — I5042 Chronic combined systolic (congestive) and diastolic (congestive) heart failure: Secondary | ICD-10-CM

## 2021-06-16 DIAGNOSIS — F17210 Nicotine dependence, cigarettes, uncomplicated: Secondary | ICD-10-CM | POA: Diagnosis present

## 2021-06-16 DIAGNOSIS — E1129 Type 2 diabetes mellitus with other diabetic kidney complication: Secondary | ICD-10-CM

## 2021-06-16 DIAGNOSIS — Z8249 Family history of ischemic heart disease and other diseases of the circulatory system: Secondary | ICD-10-CM

## 2021-06-16 DIAGNOSIS — R0602 Shortness of breath: Secondary | ICD-10-CM | POA: Diagnosis not present

## 2021-06-16 DIAGNOSIS — Z20822 Contact with and (suspected) exposure to covid-19: Secondary | ICD-10-CM | POA: Diagnosis present

## 2021-06-16 DIAGNOSIS — Z833 Family history of diabetes mellitus: Secondary | ICD-10-CM

## 2021-06-16 DIAGNOSIS — I5021 Acute systolic (congestive) heart failure: Secondary | ICD-10-CM | POA: Diagnosis not present

## 2021-06-16 DIAGNOSIS — Z7982 Long term (current) use of aspirin: Secondary | ICD-10-CM

## 2021-06-16 DIAGNOSIS — N179 Acute kidney failure, unspecified: Secondary | ICD-10-CM | POA: Diagnosis not present

## 2021-06-16 DIAGNOSIS — I509 Heart failure, unspecified: Secondary | ICD-10-CM

## 2021-06-16 DIAGNOSIS — R9431 Abnormal electrocardiogram [ECG] [EKG]: Secondary | ICD-10-CM | POA: Diagnosis present

## 2021-06-16 DIAGNOSIS — E1169 Type 2 diabetes mellitus with other specified complication: Secondary | ICD-10-CM | POA: Diagnosis not present

## 2021-06-16 DIAGNOSIS — Z95810 Presence of automatic (implantable) cardiac defibrillator: Secondary | ICD-10-CM | POA: Diagnosis present

## 2021-06-16 DIAGNOSIS — I10 Essential (primary) hypertension: Secondary | ICD-10-CM | POA: Diagnosis present

## 2021-06-16 DIAGNOSIS — N182 Chronic kidney disease, stage 2 (mild): Secondary | ICD-10-CM | POA: Diagnosis not present

## 2021-06-16 DIAGNOSIS — Z9111 Patient's noncompliance with dietary regimen: Secondary | ICD-10-CM

## 2021-06-16 DIAGNOSIS — E1122 Type 2 diabetes mellitus with diabetic chronic kidney disease: Secondary | ICD-10-CM | POA: Diagnosis present

## 2021-06-16 DIAGNOSIS — Z825 Family history of asthma and other chronic lower respiratory diseases: Secondary | ICD-10-CM

## 2021-06-16 DIAGNOSIS — E785 Hyperlipidemia, unspecified: Secondary | ICD-10-CM | POA: Diagnosis present

## 2021-06-16 DIAGNOSIS — R609 Edema, unspecified: Secondary | ICD-10-CM | POA: Diagnosis not present

## 2021-06-16 DIAGNOSIS — Z794 Long term (current) use of insulin: Secondary | ICD-10-CM

## 2021-06-16 DIAGNOSIS — I130 Hypertensive heart and chronic kidney disease with heart failure and stage 1 through stage 4 chronic kidney disease, or unspecified chronic kidney disease: Secondary | ICD-10-CM | POA: Diagnosis not present

## 2021-06-16 DIAGNOSIS — R0689 Other abnormalities of breathing: Secondary | ICD-10-CM | POA: Diagnosis not present

## 2021-06-16 DIAGNOSIS — R778 Other specified abnormalities of plasma proteins: Secondary | ICD-10-CM | POA: Diagnosis present

## 2021-06-16 DIAGNOSIS — Z823 Family history of stroke: Secondary | ICD-10-CM

## 2021-06-16 DIAGNOSIS — J449 Chronic obstructive pulmonary disease, unspecified: Secondary | ICD-10-CM | POA: Diagnosis present

## 2021-06-16 DIAGNOSIS — R0902 Hypoxemia: Secondary | ICD-10-CM | POA: Diagnosis not present

## 2021-06-16 DIAGNOSIS — E1165 Type 2 diabetes mellitus with hyperglycemia: Secondary | ICD-10-CM

## 2021-06-16 DIAGNOSIS — E119 Type 2 diabetes mellitus without complications: Secondary | ICD-10-CM

## 2021-06-16 DIAGNOSIS — F17200 Nicotine dependence, unspecified, uncomplicated: Secondary | ICD-10-CM | POA: Diagnosis present

## 2021-06-16 DIAGNOSIS — Z888 Allergy status to other drugs, medicaments and biological substances status: Secondary | ICD-10-CM

## 2021-06-16 DIAGNOSIS — Z90710 Acquired absence of both cervix and uterus: Secondary | ICD-10-CM

## 2021-06-16 DIAGNOSIS — Z885 Allergy status to narcotic agent status: Secondary | ICD-10-CM

## 2021-06-16 DIAGNOSIS — E781 Pure hyperglyceridemia: Secondary | ICD-10-CM | POA: Diagnosis not present

## 2021-06-16 DIAGNOSIS — K3184 Gastroparesis: Secondary | ICD-10-CM | POA: Diagnosis not present

## 2021-06-16 LAB — CBC WITH DIFFERENTIAL/PLATELET
Abs Immature Granulocytes: 0.04 10*3/uL (ref 0.00–0.07)
Basophils Absolute: 0.1 10*3/uL (ref 0.0–0.1)
Basophils Relative: 1 %
Eosinophils Absolute: 0.3 10*3/uL (ref 0.0–0.5)
Eosinophils Relative: 3 %
HCT: 41.3 % (ref 36.0–46.0)
Hemoglobin: 12.6 g/dL (ref 12.0–15.0)
Immature Granulocytes: 0 %
Lymphocytes Relative: 20 %
Lymphs Abs: 2.1 10*3/uL (ref 0.7–4.0)
MCH: 26.2 pg (ref 26.0–34.0)
MCHC: 30.5 g/dL (ref 30.0–36.0)
MCV: 85.9 fL (ref 80.0–100.0)
Monocytes Absolute: 0.7 10*3/uL (ref 0.1–1.0)
Monocytes Relative: 7 %
Neutro Abs: 7.3 10*3/uL (ref 1.7–7.7)
Neutrophils Relative %: 69 %
Platelets: 258 10*3/uL (ref 150–400)
RBC: 4.81 MIL/uL (ref 3.87–5.11)
RDW: 17.7 % — ABNORMAL HIGH (ref 11.5–15.5)
WBC: 10.5 10*3/uL (ref 4.0–10.5)
nRBC: 0 % (ref 0.0–0.2)

## 2021-06-16 LAB — COMPREHENSIVE METABOLIC PANEL
ALT: 43 U/L (ref 0–44)
AST: 64 U/L — ABNORMAL HIGH (ref 15–41)
Albumin: 3.5 g/dL (ref 3.5–5.0)
Alkaline Phosphatase: 152 U/L — ABNORMAL HIGH (ref 38–126)
Anion gap: 9 (ref 5–15)
BUN: 15 mg/dL (ref 8–23)
CO2: 26 mmol/L (ref 22–32)
Calcium: 9.4 mg/dL (ref 8.9–10.3)
Chloride: 102 mmol/L (ref 98–111)
Creatinine, Ser: 1.08 mg/dL — ABNORMAL HIGH (ref 0.44–1.00)
GFR, Estimated: 56 mL/min — ABNORMAL LOW (ref >60)
Glucose, Bld: 333 mg/dL — ABNORMAL HIGH (ref 70–99)
Potassium: 4.3 mmol/L (ref 3.5–5.1)
Sodium: 137 mmol/L (ref 135–145)
Total Bilirubin: 0.7 mg/dL (ref 0.3–1.2)
Total Protein: 6.8 g/dL (ref 6.5–8.1)

## 2021-06-16 LAB — ECHOCARDIOGRAM COMPLETE
AV Vena cont: 0.2 cm
Area-P 1/2: 4.96 cm2
Height: 62 in
MV M vel: 5.32 m/s
MV Peak grad: 113.2 mmHg
P 1/2 time: 430 msec
Radius: 0.4 cm
S' Lateral: 5.1 cm
Weight: 3280 oz

## 2021-06-16 LAB — CBC
HCT: 41.9 % (ref 36.0–46.0)
Hemoglobin: 12.8 g/dL (ref 12.0–15.0)
MCH: 26.2 pg (ref 26.0–34.0)
MCHC: 30.5 g/dL (ref 30.0–36.0)
MCV: 85.7 fL (ref 80.0–100.0)
Platelets: 180 10*3/uL (ref 150–400)
RBC: 4.89 MIL/uL (ref 3.87–5.11)
RDW: 18.1 % — ABNORMAL HIGH (ref 11.5–15.5)
WBC: 9.2 10*3/uL (ref 4.0–10.5)
nRBC: 0 % (ref 0.0–0.2)

## 2021-06-16 LAB — GLUCOSE, CAPILLARY
Glucose-Capillary: 153 mg/dL — ABNORMAL HIGH (ref 70–99)
Glucose-Capillary: 224 mg/dL — ABNORMAL HIGH (ref 70–99)
Glucose-Capillary: 239 mg/dL — ABNORMAL HIGH (ref 70–99)

## 2021-06-16 LAB — TROPONIN I (HIGH SENSITIVITY)
Troponin I (High Sensitivity): 76 ng/L — ABNORMAL HIGH (ref <18)
Troponin I (High Sensitivity): 67 ng/L — ABNORMAL HIGH (ref <18)

## 2021-06-16 LAB — RESP PANEL BY RT-PCR (FLU A&B, COVID) ARPGX2
Influenza A by PCR: NEGATIVE
Influenza B by PCR: NEGATIVE
SARS Coronavirus 2 by RT PCR: NEGATIVE

## 2021-06-16 LAB — PHOSPHORUS: Phosphorus: 4.5 mg/dL (ref 2.5–4.6)

## 2021-06-16 LAB — HIV ANTIBODY (ROUTINE TESTING W REFLEX): HIV Screen 4th Generation wRfx: NONREACTIVE

## 2021-06-16 LAB — LACTIC ACID, PLASMA: Lactic Acid, Venous: 1.6 mmol/L (ref 0.5–1.9)

## 2021-06-16 LAB — MAGNESIUM: Magnesium: 2.1 mg/dL (ref 1.7–2.4)

## 2021-06-16 LAB — BRAIN NATRIURETIC PEPTIDE: B Natriuretic Peptide: 261.8 pg/mL — ABNORMAL HIGH (ref 0.0–100.0)

## 2021-06-16 LAB — CBG MONITORING, ED: Glucose-Capillary: 236 mg/dL — ABNORMAL HIGH (ref 70–99)

## 2021-06-16 MED ORDER — FUROSEMIDE 10 MG/ML IJ SOLN
40.0000 mg | Freq: Once | INTRAMUSCULAR | Status: AC
  Administered 2021-06-16: 40 mg via INTRAVENOUS
  Filled 2021-06-16: qty 4

## 2021-06-16 MED ORDER — INSULIN GLARGINE-YFGN 100 UNIT/ML ~~LOC~~ SOLN
28.0000 [IU] | Freq: Every day | SUBCUTANEOUS | Status: DC
  Administered 2021-06-16 – 2021-06-18 (×3): 28 [IU] via SUBCUTANEOUS
  Filled 2021-06-16 (×3): qty 0.28

## 2021-06-16 MED ORDER — ENOXAPARIN SODIUM 40 MG/0.4ML IJ SOSY
40.0000 mg | PREFILLED_SYRINGE | INTRAMUSCULAR | Status: DC
  Administered 2021-06-16 – 2021-06-17 (×2): 40 mg via SUBCUTANEOUS
  Filled 2021-06-16 (×2): qty 0.4

## 2021-06-16 MED ORDER — SODIUM CHLORIDE 0.9% FLUSH
3.0000 mL | Freq: Two times a day (BID) | INTRAVENOUS | Status: DC
  Administered 2021-06-16 – 2021-06-18 (×4): 3 mL via INTRAVENOUS

## 2021-06-16 MED ORDER — ALBUTEROL SULFATE (2.5 MG/3ML) 0.083% IN NEBU: 3.0000 mL | INHALATION_SOLUTION | Freq: Four times a day (QID) | RESPIRATORY_TRACT | Status: DC | PRN

## 2021-06-16 MED ORDER — ATORVASTATIN CALCIUM 80 MG PO TABS
80.0000 mg | ORAL_TABLET | Freq: Every day | ORAL | Status: DC
  Administered 2021-06-16 – 2021-06-17 (×2): 80 mg via ORAL
  Filled 2021-06-16 (×2): qty 1

## 2021-06-16 MED ORDER — ASPIRIN EC 81 MG PO TBEC
81.0000 mg | DELAYED_RELEASE_TABLET | Freq: Every day | ORAL | Status: DC
  Administered 2021-06-16 – 2021-06-18 (×3): 81 mg via ORAL
  Filled 2021-06-16 (×3): qty 1

## 2021-06-16 MED ORDER — LIRAGLUTIDE 18 MG/3ML ~~LOC~~ SOPN: 1.8000 mg | PEN_INJECTOR | Freq: Every day | SUBCUTANEOUS | Status: DC

## 2021-06-16 MED ORDER — METOPROLOL SUCCINATE ER 100 MG PO TB24
100.0000 mg | ORAL_TABLET | Freq: Every day | ORAL | Status: DC
  Administered 2021-06-16 – 2021-06-17 (×2): 100 mg via ORAL
  Filled 2021-06-16: qty 1
  Filled 2021-06-16: qty 4

## 2021-06-16 MED ORDER — SODIUM CHLORIDE 0.9% FLUSH: 3.0000 mL | INTRAVENOUS | Status: DC | PRN

## 2021-06-16 MED ORDER — DAPAGLIFLOZIN PROPANEDIOL 10 MG PO TABS
10.0000 mg | ORAL_TABLET | Freq: Every day | ORAL | Status: DC
  Administered 2021-06-16 – 2021-06-18 (×3): 10 mg via ORAL
  Filled 2021-06-16 (×3): qty 1

## 2021-06-16 MED ORDER — ASPIRIN EC 81 MG PO TBEC: 81.0000 mg | DELAYED_RELEASE_TABLET | Freq: Two times a day (BID) | ORAL | Status: DC

## 2021-06-16 MED ORDER — ONDANSETRON HCL 4 MG/2ML IJ SOLN: 4.0000 mg | Freq: Four times a day (QID) | INTRAMUSCULAR | Status: DC | PRN

## 2021-06-16 MED ORDER — ACETAMINOPHEN 325 MG PO TABS: 650.0000 mg | ORAL_TABLET | ORAL | Status: DC | PRN

## 2021-06-16 MED ORDER — SODIUM CHLORIDE 0.9 % IV SOLN: 250.0000 mL | INTRAVENOUS | Status: DC | PRN

## 2021-06-16 MED ORDER — LOSARTAN POTASSIUM 25 MG PO TABS
25.0000 mg | ORAL_TABLET | Freq: Every day | ORAL | Status: DC
  Administered 2021-06-16 – 2021-06-17 (×2): 25 mg via ORAL
  Filled 2021-06-16 (×2): qty 1

## 2021-06-16 MED ORDER — INSULIN ASPART 100 UNIT/ML IJ SOLN
0.0000 [IU] | Freq: Three times a day (TID) | INTRAMUSCULAR | Status: DC
  Administered 2021-06-16 (×2): 3 [IU] via SUBCUTANEOUS

## 2021-06-16 MED ORDER — BUPROPION HCL ER (XL) 150 MG PO TB24
150.0000 mg | ORAL_TABLET | Freq: Two times a day (BID) | ORAL | Status: DC
  Administered 2021-06-16 – 2021-06-18 (×5): 150 mg via ORAL
  Filled 2021-06-16 (×6): qty 1

## 2021-06-16 MED ORDER — UMECLIDINIUM-VILANTEROL 62.5-25 MCG/INH IN AEPB
1.0000 | INHALATION_SPRAY | Freq: Every day | RESPIRATORY_TRACT | Status: DC
  Administered 2021-06-16 – 2021-06-17 (×2): 1 via RESPIRATORY_TRACT
  Filled 2021-06-16: qty 14

## 2021-06-16 NOTE — H&P (Addendum)
Date: 06/16/2021               Patient Name:  [name] MRN: [id number]  DOB: 1951/02/15 Age / Sex: 70 y.o., female   PCP: Maudie Mercury, MD         Medical Service: Internal Medicine Teaching Service         Attending Physician: Dr. Velna Ochs, MD    First Contact: Farrel Gordon, DO Pager: ED [phone]  Second Contact: Rick Duff, MD Pager: [facility] [phone]       After Hours (After 5p/  First Contact Pager: [phone]  weekends / holidays): Second Contact Pager: [phone]   SUBJECTIVE  Chief Complaint: Shortness of breath  History of Present Illness: [name] is a 70 y.o. female with a pertinent PMH of hypertension, T2DM, HFrEF s/p ICD (EF of 25%), Gastroparesis, HLD, who presents to [facility].  Patient states that she first noticed worsening of her underlying shortness of breath on Monday when she got up to go to the kitchen to make herself food.  Her shortness of breath is primarily worsened with exertion but has noticed increased orthopnea and paroxysmal dyspnea.  As result she has a new oxygen requirement of 2 -3 L.  She is prescribed oxygen as needed at home but has not used it recently until now.  She endorses increased lower extremity swelling and some chest pain.  She states the chest pain was central pressure that did not radiate.  She denied any associated nausea or vomiting.  She states the pain was worse with exertion and is uncertain how long it lasted.  She states that she is not having chest pain currently.  She had a left heart cath performed in 2017 without evidence of CAD.  Patient was encouraged to check her weight daily at her last appointment and states that her weight has been roughly 199 lbs prior to admission. And now weights roughly 205 lbs. she states that she has been taking her home medications as prescribed without missing any doses.  She attributes her recent increase in weight to dietary and fluid consumption.  She recently had a friend move to a  near by hotel and has been taking her out to heat frequently.  She also states that she has been drinking more fluids due to the heat.  She states that she is peeing well without any dysuria, hematuria, polyuria.  She does have a new cough, but denies any sick contacts, fevers, chills, myalgias, or recent travel.  She did however have a recent change in medication.  She was recently started on Wellbutrin at her last office visit for tobacco use disorder.  She states that this has helped her cut down to 5 to 10 cigarettes/day.  Additionally, patient has received both COVID vaccines and her booster.   Medications: No current facility-administered medications on file prior to encounter.   Current Outpatient Medications on File Prior to Encounter  Medication Sig Dispense Refill   albuterol (VENTOLIN HFA) 108 (90 Base) MCG/ACT inhaler Inhale 2 puffs into the lungs every 6 (six) hours as needed for wheezing or shortness of breath. 8 g 2   aspirin EC 81 MG tablet Take 1 tablet (81 mg total) by mouth 2 (two) times daily. (Patient taking differently: Take 81 mg by mouth daily.) 84 tablet 0   atorvastatin (LIPITOR) 80 MG tablet Take 1 tablet (80 mg total) by mouth at bedtime. (Patient taking differently: Take 80 mg by mouth daily.)  90 tablet 3   buPROPion (WELLBUTRIN XL) 150 MG 24 hr tablet Take 1 tablet (150 mg total) by mouth daily for 3 days, THEN 1 tablet (150 mg total) in the morning and at bedtime for 27 days. 57 tablet 0   calcium-vitamin D (OSCAL WITH D) 500-200 MG-UNIT tablet Take 1 tablet by mouth 2 (two) times daily. 90 tablet 6   clobetasol ointment (TEMOVATE) 1.02 % Apply 1 application topically 2 (two) times daily as needed (Skin rash).      dapagliflozin propanediol (FARXIGA) 10 MG TABS tablet Take 1 tablet (10 mg total) by mouth daily. 30 tablet 2   furosemide (LASIX) 40 MG tablet Take 1 tablet (40 mg total) by mouth 2 (two) times daily. 60 tablet 5   insulin degludec (TRESIBA FLEXTOUCH) 100  UNIT/ML FlexTouch Pen Inject 28 Units into the skin daily. (Patient taking differently: Inject 22 Units into the skin daily.) 15 mL 2   insulin lispro (HUMALOG) 100 UNIT/ML injection Inject 0.06 mLs (6 Units total) into the skin 2 (two) times daily with a meal. (Patient taking differently: Inject 6 Units into the skin with breakfast, with lunch, and with evening meal.) 6 mL 1   liraglutide (VICTOZA) 18 MG/3ML SOPN Inject 1.23m daily into the skin (Patient taking differently: Inject 1.8 mg into the skin daily. Inject 1.86mdaily into the skin) 30 mL 3   losartan (COZAAR) 25 MG tablet Take 1 tablet (25 mg total) by mouth daily. 30 tablet 2   metoprolol succinate (TOPROL-XL) 50 MG 24 hr tablet Take 2 tablets (100 mg total) by mouth daily. 180 tablet 3   spironolactone (ALDACTONE) 25 MG tablet TAKE 0.5 TABLETS BY MOUTH EVERY DAY (Patient taking differently: Take 12.5 mg by mouth daily.) 45 tablet 3   umeclidinium-vilanterol (ANORO ELLIPTA) 62.5-25 MCG/INH AEPB Inhale 1 puff into the lungs daily. 1 each 3   Blood Glucose Monitoring Suppl (ACCU-CHEK GUIDE ME) w/Device KIT by Does not apply route.     Insulin Pen Needle (PEN NEEDLES) 32G X 4 MM MISC Use to inject insulin 3 times times per day 100 each 3   Lancets Misc. (ACCU-CHEK FASTCLIX LANCET) KIT Lancing device, use with lancets for blood glucose monitoring. 1 kit 0   Lancets MISC 1 Act by Does not apply route 4 (four) times daily. 200 each 3   nitroGLYCERIN (NITROSTAT) 0.4 MG SL tablet Place 1 tablet (0.4 mg total) under the tongue every 5 (five) minutes as needed for chest pain. Reported on 03/18/2016 (Patient not taking: Reported on 06/16/2021) 25 tablet 3    Past Medical History:  Past Medical History:  Diagnosis Date   ATN (acute tubular necrosis) ([facility]/13/2015   Automatic implantable cardioverter-defibrillator in situ    Automatic implantable cardioverter-defibrillator in situ 05/04/2007   Qualifier: Diagnosis of  By: CrIsla Pence    Cardiac defibrillator in situ    Atlas II VR (SJM) implanted by Dr TaLovena Le CHF (congestive heart failure) ([facility]   Chronic combined systolic and diastolic heart failure ([facility]   a. EF 35-40% in past;  b. Echo 7/13:  EF 45-50%, Gr 2 diast dysfn, mild AI, mild MAC, trivial MR, mild LAE, PASP 47.   Chronic ulcer of leg ([facility]   04-09-15 resolved-not a problem.   Colon polyps 04/12/2013   Rectosigmoid polyp   COPD (chronic obstructive pulmonary disease) (HCC)    O2 at night   Depression    Diabetes mellitus  Diabetes mellitus, type 2 ([place]) 04/03/2012   HIGH RISK FEET.. Please have patient take shoes and socks off every visit for visual foot inspection.     Eczema    Elevated alkaline phosphatase level    GGT and 5'nucleotidase 8/13 normal   Health care maintenance 01/22/2013   Surgically absent cervix- no pap needed (Path report 07/2000: uterine body with attached bilateral  adnexa and separate cervix.)   History of oxygen administration    oxygen @ 2 l/m nasally bedtime 24/7   HTN (hypertension)    Hx of cardiac cath    a. [place] 2003 normal;  b. [facility] 6/13:  Mild calcification in the LM, o/w normal coronary arteries, EF 45%.    Hyperkalemia 08/08/2017   Hyperlipidemia    Elevated triglyceride in 2019   Hyperlipidemia associated with type 2 diabetes mellitus ([place]) 05/25/2007   Qualifier: Diagnosis of  By: Hassell Done FNP, [place]     Hyperthyroidism, subclinical    HYPERTHYROIDISM, SUBCLINICAL 05/06/2009   Qualifier: Diagnosis of  By: Darrick Meigs     Implantable cardioverter-defibrillator (ICD) generator end of life    Lipoma    Need for hepatitis C screening test 04/25/2019   NICM (nonischemic cardiomyopathy) ([place])    Obesity    On home oxygen therapy    "2L; 24/7" (10/11/2016)   Post menopausal syndrome    Shortness of breath 07/14/2017   Skin rash 07/12/2018   Sleep apnea    pt denies 04/12/2013    Social:  Lives - [place] Occupation - retired Research officer, 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 Gilford Rile Substance use - cigarettes -5-10 cigarettes/day, >/= 22-pack-year history  Family History: Family History  Problem Relation Age of Onset   Stroke Mother    Seizures Father    Heart disease Father    Diabetes Sister    Asthma Maternal Aunt        aunts   Asthma Maternal Uncle        uncles   Heart disease Paternal Aunt        aunts   Heart disease Paternal Uncle        uncles   Heart disease Maternal Aunt        aunts   Heart disease Maternal Uncle        uncles   Heart disease Maternal Grandfather    Breast cancer Daughter    Breast cancer Cousin    Asthma Grandchild    Colon cancer Neg Hx    Colon polyps Neg Hx    Esophageal cancer Neg Hx    Kidney disease Neg Hx    Gallbladder disease Neg Hx     Allergies: Allergies as of 06/16/2021 - Review Complete 06/16/2021  Allergen Reaction Noted   Actos [pioglitazone] Other (See Comments) 03/12/2009   Naproxen Other (See Comments) 02/05/2016   Rosiglitazone Hives 02/11/2016   Tomato Itching 06/16/2021   Hydrocodone-acetaminophen Itching 08/08/2019   Hydrocortisone Hives, Itching, and Other (See Comments) 10/06/2009    Review of Systems: A complete ROS was negative except as per HPI.   OBJECTIVE:  Physical Exam: Blood pressure (!) 148/87, pulse 69, temperature 97.7 F (36.5 C), temperature source Oral, resp. rate (!) 26, height _0  (1.575 m), weight 93 kg, SpO2 100 %. Physical Exam Constitutional:      Appearance: She is well-developed.  HENT:     Head: Normocephalic and atraumatic.  Cardiovascular:     Rate and  Rhythm: Normal rate and regular rhythm.     Pulses: Normal pulses.     Heart sounds:    Gallop (possible S3 best heard at apex) present.  Pulmonary:     Effort: Pulmonary effort is normal.     Breath sounds: Rales (bilateral lung bases) present.  Abdominal:     General: Bowel sounds are normal. There is distension.     Palpations:  Abdomen is soft.     Tenderness: There is no abdominal tenderness. There is no guarding.  Musculoskeletal:        General: Swelling (bilateral LE edema (1-2+) to her knees) present. Normal range of motion.  Skin:    General: Skin is warm and dry.  Neurological:     General: No focal deficit present.     Mental Status: She is alert and oriented to person, place, and time.    Pertinent Labs: CBC    Component Value Date/Time   WBC 10.5 06/16/2021 0502   RBC 4.81 06/16/2021 0502   HGB 12.6 06/16/2021 0502   HGB 14.1 02/08/2017 1551   HCT 41.3 06/16/2021 0502   HCT 44.5 02/08/2017 1551   PLT 258 06/16/2021 0502   PLT 246 02/08/2017 1551   MCV 85.9 06/16/2021 0502   MCV 87 02/08/2017 1551   MCH 26.2 06/16/2021 0502   MCHC 30.5 06/16/2021 0502   RDW 17.7 (H) 06/16/2021 0502   RDW 16.7 (H) 02/08/2017 1551   LYMPHSABS 2.1 06/16/2021 0502   LYMPHSABS 2.6 02/08/2017 1551   MONOABS 0.7 06/16/2021 0502   EOSABS 0.3 06/16/2021 0502   EOSABS 0.3 02/08/2017 1551   BASOSABS 0.1 06/16/2021 0502   BASOSABS 0.0 02/08/2017 1551     CMP     Component Value Date/Time   NA 137 06/16/2021 0502   NA 137 02/04/2021 1409   K 4.3 06/16/2021 0502   CL 102 06/16/2021 0502   CO2 26 06/16/2021 0502   GLUCOSE 333 (H) 06/16/2021 0502   BUN 15 06/16/2021 0502   BUN 23 02/04/2021 1409   CREATININE 1.08 (H) 06/16/2021 0502   CREATININE 0.88 06/16/2016 1338   CALCIUM 9.4 06/16/2021 0502   PROT 6.8 06/16/2021 0502   PROT 6.9 02/08/2017 1551   ALBUMIN 3.5 06/16/2021 0502   ALBUMIN 4.1 02/08/2017 1551   AST 64 (H) 06/16/2021 0502   ALT 43 06/16/2021 0502   ALKPHOS 152 (H) 06/16/2021 0502   BILITOT 0.7 06/16/2021 0502   BILITOT 0.4 02/08/2017 1551   GFRNONAA 56 (L) 06/16/2021 0502   GFRNONAA 75 02/13/2015 1015   GFRAA 78 07/14/2020 1536   GFRAA 87 02/13/2015 1015   BNP (last 3 results) Recent Labs    06/16/21 0502  BNP 261.8*    Pertinent Imaging: Chest XR:  (06/16/21) Cardiopericardial enlargement and vascular pedicle widening. Defibrillator lead into the right ventricle. Hazy bilateral lower chest attributed to pleural fluid with diffuse interstitial opacity. No pneumothorax. IMPRESSION: CHF  Echocardiogram: pending  EKG: personally reviewed my interpretation is normal sinus rhythm with T-wave inversion in lead I and evidence of right atrial enlargement, poor r-wave progression due to lead placement verus pulmonary disease, prolonged QTc interval, EKG unchanged from previous tracings  ASSESSMENT & PLAN:  Assessment: Active Problems:   Acute exacerbation of CHF (congestive heart failure) ([place])   [name] is a 70 y.o. with pertinent PMH of hypertension, T2DM, HFrEF s/p ICD (EF of 25%), Gastroparesis, HLD who presented with [facility] and admit for acute CHF exacerbation on hospital  day 0  Plan: #Acute CHF Exacerbation  #HFrEF (EF 25%) s/p ICD  Patient presents with signs and symptoms of acute decompensated heart failure 2/2 to increased dietary/fluid  consumption.  Patient states that she has been taking her home diuretics as prescribed.  She is currently on furosemide 40 mg twice daily and spironolactone 12.5 mg daily and was previously tolerating these well.  She was given 40 mg of IV Lasix here in the hospital with good urine output.  Her last echo was done over a year ago with significantly reduced ejection fraction and elevated pulmonary pressures likely secondary to longstanding diabetes/hypertension and lung disease from tobacco use.  I am unsure of the patient's dry weight, but patient was weighing herself daily prior to admission and states that her weight was roughly 199 lbs and her weight in the ED is 205 lbs.  -Given 40 mg IV Lasix in the ED will assess response and redose accordingly - K WNL, checking Mg and Phos -Goal K > 4 and Mg > 2 while diuresing -We will continue the following GDMT: Losartan 25 mg daily, metoprolol 100 mg daily,  and dapagloflozin 10 mg daily -We will hold spironolactone while diuresing to avoid hypotension -Strict INO and daily weights - Echocardiogram pending (will consult hear failure team if significantly reduced)   #Troponinemia: Patient had a mild elevation in her troponin at 76 that have trended downward to 67.  She does report central chest pain that is worse with exertion.  Her EKG shows no acute ischemic changes. Previous left heart cath in 2017 was within normal limits.  She did have a significantly elevated blood pressure on admission of 150/93 in the setting of acute decompensated heart failure which could have precipitated an elevation in her troponin secondary to demand.  Therefore, I will perform an echocardiogram to assess for new wall motion abnormalities/structural heart disease. - Echocardiogram pending - Repeat Troponins with chest pain   #Diabetes Mellitus, Type 2: Patient has a history of i type 2 diabetes with her recent A1c of 9.4.  He is recently had significant provement of her A1c on Humalog 6 units twice daily, Tresiba 28 units daily, liraglutide 1.8 mg daily, and dapagliflozin 10 mg daily. -CBG monitoring 4 times daily with meals -Sliding scale insulin -Tresiba 28 units daily -Liraglutide 1.8 mg daily -Dapagliflozin 10 mg daily  #CKD, Stage II Patient has a history of stage II chronic kidney disease with a baseline Cr 0.9 and a GFR > 60 with evidence of albuminuria.  This is likely secondary to her longstanding diabetes and hypertension.  She presents with a relatively stable creatinine of 1.08 and a GFR of 56. -Monitor urine output and kidney function daily -Avoid nephrotoxic medications when possible  #HTN #HLD #Aortic atherosclerosis  Patient has a history of hyperlipidemia with her last LDL of 93 and total cholesterol of 170 on atorvastatin 80 mg and ASA 81 mg for secondary prevention of ASCVD.  She is at goal of less than 100 for LDL.  We will titrate blood  pressure medications depending on response to Lasix. -Continue atorvastatin 80 mg and ASA 81 mg -Continue losartan 25 mg and metoprolol 100 mg -Hold spironolactone 12.5 mg  #Tobacoo-use disorder: - Continue Wellbutrin  Best Practice: Diet: Cardiac diet IVF: none VTE: enoxaparin (LOVENOX) injection 40 mg Start: 06/16/21 1600 Code: Full AB: none Status: Inpatient with expected length of stay greater than 2 midnights. Anticipated Discharge Location: Home Barriers to Discharge: Medical stability and New oxygen  Signature: Lawerance Cruel, D.O.  Internal Medicine Resident, PGY-3 [facility]  Pager: [phone] 9:05 AM, 06/16/2021   Please contact the on call pager after 5 pm and on weekends at [phone].

## 2021-06-16 NOTE — TOC Progression Note (Addendum)
Transition of Care [facility]) - Progression Note    Patient Details  Name: [name] MRN: [id number] Date of Birth: 06/04/1951  [facility] CM/SW Contact  Zenon Mayo, RN Phone Number: 06/16/2021, 3:58 PM  Clinical Narrative:    NCM spoke with patient, she lives alone pt rec HHPT, NCM offered choice, she does not have a preference. NCM made referral to Amy  with [facility] for HHPT. Patient states her insurance takes her to her MD apts.  She has a rollator at home, she has a scale and bp cuff at home. She tries to eat a low sodium diet. TOC will continue to follow for dc needs.  THN will follow also.   9/15- NCM received notification from Amy with [facility], she is unable to take referral.  NCM made referral to [facility] with [facility], she is able to take referral soc will be delayed for about 4 to 5 days , but patient is not discharging for a couple of day.        Expected Discharge Plan and Services                                                 Social Determinants of Health (SDOH) Interventions Food Insecurity Interventions: Intervention Not Indicated (Pt receives food stamps) Financial Strain Interventions: Intervention Not Indicated Housing Interventions: Intervention Not Indicated Transportation Interventions: Intervention Not Indicated  Readmission Risk Interventions No flowsheet data found.

## 2021-06-16 NOTE — Progress Notes (Signed)
Echocardiogram 2D Echocardiogram has been performed.  Oneal Deputy Jenie Parish RDCS 06/16/2021, 8:41 AM  Dr. Oval Linsey notified of stat

## 2021-06-16 NOTE — ED Provider Notes (Signed)
[facility] EMERGENCY DEPARTMENT Provider Note   CSN: [id number] Arrival date & time: 06/16/21  0445     History Chief Complaint  Patient presents with   Shortness of Breath    [name] is a 70 y.o. female.  Patient presents to the emergency department for evaluation of shortness of breath.  Patient reports that she has a history of congestive heart failure.  She has noticed over the last couple of days some increased swelling of her legs followed by shortness of breath.  Shortness of breath was initially just with exertion, now at rest.  Patient reports that when the shortness of breath is at its worst, she has a tightness in her chest, not currently present.  EMS report that she had a room air oxygen saturation of 89%.  She was placed on nasal cannula oxygen with improvement.      Past Medical History:  Diagnosis Date   ATN (acute tubular necrosis) ([place]) 07/15/2014   Automatic implantable cardioverter-defibrillator in situ    Automatic implantable cardioverter-defibrillator in situ 05/04/2007   Qualifier: Diagnosis of  By: Isla Pence     Cardiac defibrillator in situ    Atlas II VR (SJM) implanted by Dr Lovena Le   CHF (congestive heart failure) ([place])    Chronic combined systolic and diastolic heart failure ([place])    a. EF 35-40% in past;  b. Echo 7/13:  EF 45-50%, Gr 2 diast dysfn, mild AI, mild MAC, trivial MR, mild LAE, PASP 47.   Chronic ulcer of leg ([place])    04-09-15 resolved-not a problem.   Colon polyps 04/12/2013   Rectosigmoid polyp   COPD (chronic obstructive pulmonary disease) (HCC)    O2 at night   Depression    Diabetes mellitus    Diabetes mellitus, type 2 ([place]) 04/03/2012   HIGH RISK FEET.. Please have patient take shoes and socks off every visit for visual foot inspection.     Eczema    Elevated alkaline phosphatase level    GGT and 5'nucleotidase 8/13 normal   Health care maintenance 01/22/2013   Surgically absent cervix- no pap needed  (Path report 07/2000: uterine body with attached bilateral  adnexa and separate cervix.)   History of oxygen administration    oxygen @ 2 l/m nasally bedtime 24/7   HTN (hypertension)    Hx of cardiac cath    a. [place] 2003 normal;  b. LHC 6/13:  Mild calcification in the LM, o/w normal coronary arteries, EF 45%.    Hyperkalemia 08/08/2017   Hyperlipidemia    Elevated triglyceride in 2019   Hyperlipidemia associated with type 2 diabetes mellitus ([place]) 05/25/2007   Qualifier: Diagnosis of  By: Hassell Done FNP, Nykedtra     Hyperthyroidism, subclinical    HYPERTHYROIDISM, SUBCLINICAL 05/06/2009   Qualifier: Diagnosis of  By: Darrick Meigs     Implantable cardioverter-defibrillator (ICD) generator end of life    Lipoma    Need for hepatitis C screening test 04/25/2019   NICM (nonischemic cardiomyopathy) ([place])    Obesity    On home oxygen therapy    "2L; 24/7" (10/11/2016)   Post menopausal syndrome    Shortness of breath 07/14/2017   Skin rash 07/12/2018   Sleep apnea    pt denies 04/12/2013    Patient Active Problem List   Diagnosis Date Noted   Hepatic steatosis 07/28/2020   Hyperlipidemia    Osteoporosis 02/12/2020   Medicare annual wellness visit, subsequent 02/12/2020   Healthcare maintenance  12/26/2019   Intrinsic atopic dermatitis 09/19/2019   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   Chronic combined systolic and diastolic congestive heart failure ([place]) 03/18/2016   Spinal stenosis of lumbar region 12/09/2015   Restrictive lung disease 06/24/2015   Diabetic gastroparesis associated with type 2 diabetes mellitus ([place]) 07/12/2014   Depression 05/22/2014   DMII (diabetes mellitus, type 2) ([place]) 04/03/2012   Tobacco use disorder 10/26/2009   Essential hypertension 05/25/2007   ICD (implantable cardioverter-defibrillator) in place 05/04/2007    Past Surgical History:  Procedure Laterality Date   ABDOMINAL HYSTERECTOMY     CARDIAC CATHETERIZATION     CARDIAC CATHETERIZATION  N/A 06/21/2016   Procedure: Left Heart Cath and Coronary Angiography;  Surgeon: Sherren Mocha, MD;  Location: [facility];  Service: Cardiovascular;  Laterality: N/A;   CARDIAC DEFIBRILLATOR PLACEMENT  05/04/2007   SJM Atlas II VR ICD   CARDIAC DEFIBRILLATOR PLACEMENT     CATARACT EXTRACTION     od   COLONOSCOPY N/A 04/12/2013   Procedure: COLONOSCOPY;  Surgeon: Beryle Beams, MD;  Location: [facility];  Service: Endoscopy;  Laterality: N/A;  pt.has defibrilator   COLONOSCOPY N/A 04/16/2015   Procedure: COLONOSCOPY;  Surgeon: [name], MD;  Location: [facility];  Service: Endoscopy;  Laterality: N/A;   ESOPHAGOGASTRODUODENOSCOPY N/A 04/16/2015   Procedure: ESOPHAGOGASTRODUODENOSCOPY (EGD);  Surgeon: [name], MD;  Location: [facility];  Service: Endoscopy;  Laterality: N/A;   EYE SURGERY     HERNIA REPAIR     HYSTEROSCOPY     IMPLANTABLE CARDIOVERTER DEFIBRILLATOR (ICD) GENERATOR CHANGE N/A 04/02/2014   Procedure: ICD GENERATOR CHANGE;  Surgeon: Evans Lance, MD;  Location[facility] CATH LAB;  Service: Cardiovascular;  Laterality: N/A;   INSERT / REPLACE / REMOVE PACEMAKER     LEFT HEART CATHETERIZATION WITH CORONARY ANGIOGRAM N/A 04/02/2012   Procedure: LEFT HEART CATHETERIZATION WITH CORONARY ANGIOGRAM;  Surgeon: Hillary Bow, MD;  Location: [facility] CATH LAB;  Service: Cardiovascular;  Laterality: N/A;   ORIF ANKLE FRACTURE Right 08/12/2019   Procedure: OPEN REDUCTION INTERNAL FIXATION (ORIF) RIGHT TRIMALLEOLAR ANKLE FRACTURE;  Surgeon: Leandrew Koyanagi, MD;  Location: [place] [facility];  Service: Orthopedics;  Laterality: Right;   TONSILLECTOMY     TUBAL LIGATION       OB History   No obstetric history on file.     Family History  Problem Relation Age of Onset   Stroke Mother    Seizures Father    Heart disease Father    Diabetes Sister    Asthma Maternal Aunt        aunts   Asthma Maternal Uncle        uncles   Heart disease Paternal Aunt        aunts   Heart disease  Paternal Uncle        uncles   Heart disease Maternal Aunt        aunts   Heart disease Maternal Uncle        uncles   Heart disease Maternal Grandfather    Breast cancer Daughter    Breast cancer Cousin    Asthma Grandchild    Colon cancer Neg Hx    Colon polyps Neg Hx    Esophageal cancer Neg Hx    Kidney disease Neg Hx    Gallbladder disease Neg Hx     Social History   Tobacco Use   Smoking status: Every Day    Packs/day: 0.50  Years: 45.00    Pack years: 22.50    Types: Cigarettes   Smokeless tobacco: Never   Tobacco comments:    currently smoking 3-4 cigs per day  Vaping Use   Vaping Use: Never used  Substance Use Topics   Alcohol use: No    Alcohol/week: 1.0 standard drink    Types: 1 Glasses of wine per week   Drug use: No    Home Medications Prior to Admission medications   Medication Sig Start Date End Date Taking? Authorizing Provider  albuterol (VENTOLIN HFA) 108 (90 Base) MCG/ACT inhaler Inhale 2 puffs into the lungs every 6 (six) hours as needed for wheezing or shortness of breath. 04/28/21   Maudie Mercury, MD  aspirin EC 81 MG tablet Take 1 tablet (81 mg total) by mouth 2 (two) times daily. 08/12/19   Leandrew Koyanagi, MD  atorvastatin (LIPITOR) 80 MG tablet Take 1 tablet (80 mg total) by mouth at bedtime. 12/26/19   Kathi Ludwig, MD  Blood Glucose Monitoring Suppl (ACCU-CHEK GUIDE ME) w/Device KIT by Does not apply route.    [provider]  buPROPion (WELLBUTRIN XL) 150 MG 24 hr tablet Take 1 tablet (150 mg total) by mouth daily for 3 days, THEN 1 tablet (150 mg total) in the morning and at bedtime for 27 days. 05/24/21 06/23/21  Marianna Payment, MD  calcium-vitamin D (OSCAL WITH D) 500-200 MG-UNIT tablet Take 1 tablet by mouth 2 (two) times daily. 02/12/20   Kathi Ludwig, MD  clobetasol ointment (TEMOVATE) 2.29 % Apply 1 application topically 2 (two) times daily as needed (Skin rash).  12/17/19   [provider]  dapagliflozin  propanediol (FARXIGA) 10 MG TABS tablet Take 1 tablet (10 mg total) by mouth daily. 02/04/21   Asencion Noble, MD  furosemide (LASIX) 40 MG tablet Take 1 tablet (40 mg total) by mouth 2 (two) times daily. 02/18/21   Asencion Noble, MD  insulin degludec (TRESIBA FLEXTOUCH) 100 UNIT/ML FlexTouch Pen Inject 28 Units into the skin daily. 02/04/21   Asencion Noble, MD  insulin lispro (HUMALOG) 100 UNIT/ML injection Inject 0.06 mLs (6 Units total) into the skin 2 (two) times daily with a meal. 02/24/21   Asencion Noble, MD  Insulin Pen Needle (PEN NEEDLES) 32G X 4 MM MISC Use to inject insulin 3 times times per day 02/18/21   Asencion Noble, MD  Lancets Misc. (ACCU-CHEK FASTCLIX LANCET) KIT Lancing device, use with lancets for blood glucose monitoring. 07/17/18   Kathi Ludwig, MD  Lancets MISC 1 Act by Does not apply route 4 (four) times daily. 07/11/18   Kathi Ludwig, MD  liraglutide (VICTOZA) 18 MG/3ML SOPN Inject 1.37m daily into the skin Patient taking differently: Inject 1.8 mg into the skin daily. Inject 1.82mdaily into the skin 12/26/19   HaKathi LudwigMD  losartan (COZAAR) 25 MG tablet Take 1 tablet (25 mg total) by mouth daily. 02/04/21   KrAsencion NobleMD  metoprolol succinate (TOPROL-XL) 50 MG 24 hr tablet Take 2 tablets (100 mg total) by mouth daily. 12/26/19   HaKathi LudwigMD  nitroGLYCERIN (NITROSTAT) 0.4 MG SL tablet Place 1 tablet (0.4 mg total) under the tongue every 5 (five) minutes as needed for chest pain. Reported on 03/18/2016 Patient not taking: Reported on 02/18/2021 06/27/18   KiForde DandyPharmD  spironolactone (ALDACTONE) 25 MG tablet TAKE 0.5 TABLETS BY MOUTH EVERY DAY Patient taking differently: Take 12.5 mg by mouth daily. 12/26/19  Kathi Ludwig, MD  umeclidinium-vilanterol (ANORO ELLIPTA) 62.5-25 MCG/INH AEPB Inhale 1 puff into the lungs daily. 02/18/21   Asencion Noble, MD    Allergies    Actos [pioglitazone],  Naproxen, Rosiglitazone, Hydrocodone-acetaminophen, and Hydrocortisone  Review of Systems   Review of Systems  Respiratory:  Positive for chest tightness and shortness of breath.   All other systems reviewed and are negative.  Physical Exam Updated Vital Signs BP (!) 155/85   Pulse 71   Temp 97.9 F (36.6 C)   Resp (!) 29   Ht _0  (1.575 m)   Wt 93 kg   SpO2 98%   BMI 37.49 kg/m   Physical Exam Vitals and nursing note reviewed.  Constitutional:      General: She is not in acute distress.    Appearance: Normal appearance. She is well-developed.  HENT:     Head: Normocephalic and atraumatic.     Right Ear: Hearing normal.     Left Ear: Hearing normal.     Nose: Nose normal.  Eyes:     Conjunctiva/sclera: Conjunctivae normal.     Pupils: Pupils are equal, round, and reactive to light.  Cardiovascular:     Rate and Rhythm: Regular rhythm.     Heart sounds: S1 normal and S2 normal. No murmur heard.   No friction rub. No gallop.  Pulmonary:     Effort: Pulmonary effort is normal. Tachypnea present. No respiratory distress.     Breath sounds: Decreased breath sounds present.  Chest:     Chest wall: No tenderness.  Abdominal:     General: Bowel sounds are normal.     Palpations: Abdomen is soft.     Tenderness: There is no abdominal tenderness. There is no guarding or rebound. Negative signs include Murphy's sign and McBurney's sign.     Hernia: No hernia is present.  Musculoskeletal:        General: Normal range of motion.     Cervical back: Normal range of motion and neck supple.  Skin:    General: Skin is warm and dry.     Findings: No rash.  Neurological:     Mental Status: She is alert and oriented to person, place, and time.     GCS: GCS eye subscore is 4. GCS verbal subscore is 5. GCS motor subscore is 6.     Cranial Nerves: No cranial nerve deficit.     Sensory: No sensory deficit.     Coordination: Coordination normal.  Psychiatric:        Speech: Speech  normal.        Behavior: Behavior normal.        Thought Content: Thought content normal.    ED Results / Procedures / Treatments   Labs (all labs ordered are listed, but only abnormal results are displayed) Labs Reviewed  CBC WITH DIFFERENTIAL/PLATELET - Abnormal; Notable for the following components:      Result Value   RDW 17.7 (*)    All other components within normal limits  COMPREHENSIVE METABOLIC PANEL - Abnormal; Notable for the following components:   Glucose, Bld 333 (*)    Creatinine, Ser 1.08 (*)    AST 64 (*)    Alkaline Phosphatase 152 (*)    GFR, Estimated 56 (*)    All other components within normal limits  TROPONIN I (HIGH SENSITIVITY) - Abnormal; Notable for the following components:   Troponin I (High Sensitivity) 76 (*)    All other components  within normal limits  RESP PANEL BY RT-PCR (FLU A&B, COVID) ARPGX2  LACTIC ACID, PLASMA  BRAIN NATRIURETIC PEPTIDE    EKG None  Radiology DG Chest Port 1 View  Result Date: 06/16/2021 CLINICAL DATA:  Shortness of breath for 1 day EXAM: PORTABLE CHEST 1 VIEW COMPARISON:  07/14/2020 FINDINGS: Cardiopericardial enlargement and vascular pedicle widening. Defibrillator lead into the right ventricle. Hazy bilateral lower chest attributed to pleural fluid with diffuse interstitial opacity. No pneumothorax. IMPRESSION: CHF. Electronically Signed   By: Jorje Guild M.D.   On: 06/16/2021 05:22    Procedures Procedures   Medications Ordered in ED Medications  furosemide (LASIX) injection 40 mg (40 mg Intravenous Given 06/16/21 0514)    ED Course  I have reviewed the triage vital signs and the nursing notes.  Pertinent labs & imaging results that were available during my care of the patient were reviewed by me and considered in my medical decision making (see chart for details).    MDM Rules/Calculators/A&P                           Patient presents to the emergency department for evaluation of shortness of  breath.  Patient has a history of congestive heart failure and has noticed weight gain over the last several days.  Patient with progressively worsening shortness of breath overnight, mild hypoxia for EMS.  Improved with supplemental oxygen.  Work-up today consistent with volume overload.  Patient given Lasix 40 mg IV and will be admitted for further management.  CRITICAL CARE Performed by: Orpah Greek   Total critical care time: 31 minutes  Critical care time was exclusive of separately billable procedures and treating other patients.  Critical care was necessary to treat or prevent imminent or life-threatening deterioration.  Critical care was time spent personally by me on the following activities: development of treatment plan with patient and/or surrogate as well as nursing, discussions with consultants, evaluation of patient's response to treatment, examination of patient, obtaining history from patient or surrogate, ordering and performing treatments and interventions, ordering and review of laboratory studies, ordering and review of radiographic studies, pulse oximetry and re-evaluation of patient's condition.  Final Clinical Impression(s) / ED Diagnoses Final diagnoses:  Acute on chronic congestive heart failure, unspecified heart failure type [facility])    Rx / DC Orders ED Discharge Orders     None        Winda Summerall, Gwenyth Allegra, MD 06/16/21 [phone]

## 2021-06-16 NOTE — ED Triage Notes (Signed)
[facility] - pt c/o SOB x 1 day. Pt also noticed BLE and BUE edema. O2 was 89% on RA at home, 99% on 2LPM here.

## 2021-06-16 NOTE — Progress Notes (Signed)
Heart Failure Nurse Navigator Progress Note  PCP: Maudie Mercury, MD PCP-Cardiologist: Beckie Salts., MD Admission Diagnosis: A/C CHF Admitted from: home alone  Presentation:   [name] presented 9/14 with SOB, increased weight gain. Pt interactive with interview process, on 3LPM per [place]. Pt states she still drives and has reliable vehicle. Pt states she is taking medications as prescribed. Endorses dietary and fluid indiscretions. Agreeable to HV TOC appt upon DC.   ECHO/ LVEF: 20-25%  Clinical Course:  Past Medical History:  Diagnosis Date   ATN (acute tubular necrosis) (HCC) 07/15/2014   Automatic implantable cardioverter-defibrillator in situ    Automatic implantable cardioverter-defibrillator in situ 05/04/2007   Qualifier: Diagnosis of  By: Isla Pence     Cardiac defibrillator in situ    Atlas II VR (SJM) implanted by Dr Lovena Le   CHF (congestive heart failure) ([place])    Chronic combined systolic and diastolic heart failure ([place])    a. EF 35-40% in past;  b. Echo 7/13:  EF 45-50%, Gr 2 diast dysfn, mild AI, mild MAC, trivial MR, mild LAE, PASP 47.   Chronic ulcer of leg ([place])    04-09-15 resolved-not a problem.   Colon polyps 04/12/2013   Rectosigmoid polyp   COPD (chronic obstructive pulmonary disease) (HCC)    O2 at night   Depression    Diabetes mellitus    Diabetes mellitus, type 2 ([place]) 04/03/2012   HIGH RISK FEET.. Please have patient take shoes and socks off every visit for visual foot inspection.     Eczema    Elevated alkaline phosphatase level    GGT and 5'nucleotidase 8/13 normal   Health care maintenance 01/22/2013   Surgically absent cervix- no pap needed (Path report 07/2000: uterine body with attached bilateral  adnexa and separate cervix.)   History of oxygen administration    oxygen @ 2 l/m nasally bedtime 24/7   HTN (hypertension)    Hx of cardiac cath    a. [place] 2003 normal;  b. LHC 6/13:  Mild calcification in the LM, o/w normal coronary arteries, EF  45%.    Hyperkalemia 08/08/2017   Hyperlipidemia    Elevated triglyceride in 2019   Hyperlipidemia associated with type 2 diabetes mellitus ([place]) 05/25/2007   Qualifier: Diagnosis of  By: Hassell Done FNP, Nykedtra     Hyperthyroidism, subclinical    HYPERTHYROIDISM, SUBCLINICAL 05/06/2009   Qualifier: Diagnosis of  By: Darrick Meigs     Implantable cardioverter-defibrillator (ICD) generator end of life    Lipoma    Need for hepatitis C screening test 04/25/2019   NICM (nonischemic cardiomyopathy) ([place])    Obesity    On home oxygen therapy    "2L; 24/7" (10/11/2016)   Post menopausal syndrome    Shortness of breath 07/14/2017   Skin rash 07/12/2018   Sleep apnea    pt denies 04/12/2013     Social History   Socioeconomic History   Marital status: Significant Other    Spouse name: Not on file   Number of children: 3   Years of education: 11   Highest education level: 11th grade  Occupational History   Occupation: disabled  Tobacco Use   Smoking status: Every Day    Packs/day: 0.50    Years: 45.00    Pack years: 22.50    Types: Cigarettes   Smokeless tobacco: Never   Tobacco comments:    currently smoking 3-4 cigs per day  Vaping Use   Vaping Use: Never used  Substance and Sexual Activity   Alcohol use: Not Currently    Alcohol/week: 3.0 standard drinks    Types: 3 Shots of liquor per week   Drug use: No   Sexual activity: Yes    Partners: Male    Birth control/protection: Surgical  Other Topics Concern   Not on file  Social History Narrative   ** Merged History Encounter **       Married   Patient enjoys going to [employer], church and spending time with family   Social Determinants of Health   Financial Resource Strain: Low Risk    Difficulty of Paying Living Expenses: Not very hard  Food Insecurity: No Food Insecurity   Worried About Charity fundraiser in the Last Year: Never true   Arboriculturist in the Last Year: Never true  Transportation Needs: No  Transportation Needs   Lack of Transportation (Medical): No   Lack of Transportation (Non-Medical): No  Physical Activity: Not on file  Stress: Not on file  Social Connections: Not on file    High Risk Criteria for Readmission and/or Poor Patient Outcomes: Heart failure hospital admissions (last 6 months): 1  No Show rate: 6% Difficult social situation: no Demonstrates medication adherence: yes Primary Language: English Literacy level: able to read/write and comprehend. Wears glasses.  Education Assessment and Provision:  Detailed education and instructions provided on heart failure disease management including the following:  Signs and symptoms of Heart Failure When to call the physician Importance of daily weights Low sodium diet Fluid restriction Medication management Anticipated future follow-up appointments  Patient education given on each of the above topics.  Patient acknowledges understanding via teach back method and acceptance of all instructions.  Education Materials:  "Living Better With Heart Failure" Booklet, HF zone tool, & Daily Weight Tracker Tool.  Patient has scale at home: yes Patient has pill box at home: yes   Barriers of Care:   -dietary indiscretion   Considerations/Referrals:   Referral made to Heart Failure Pharmacist Stewardship: yes, appreciated Referral made to Heart Failure CSW/NCM TOC: yes, appreciated Referral made to Heart & Vascular TOC clinic: yes, 9/22 @ 12PM.  Items for Follow-up on DC/TOC: -optimize -dietary modifications -fluid modifications   Pricilla Holm, MSN, RN Heart Failure Nurse Navigator [phone]

## 2021-06-16 NOTE — Evaluation (Signed)
Physical Therapy Evaluation Patient Details Name: [name] MRN: [id number] DOB: 1951-07-13 Today's Date: 06/16/2021  History of Present Illness  Pt is a 70 y/o female admitted 9/14 secondary to SOB. Thought to be secondary to CHF exacerbation. PMH includes CAD s/p stent, HTN, PAD.  Clinical Impression  Pt admitted secondary to problem above with deficits below. Pt requiring min guard A for short distance gait within the room using HHA. Pt reporting increased soreness and cramping in calves (L>R) which limited mobility. Educated about using rollator at home to increase safety. Reports her daughter can assist if needed at d/c. Will continue to follow acutely to maximize functional mobility independence and safety.        Recommendations for follow up therapy are one component of a multi-disciplinary discharge planning process, led by the attending physician.  Recommendations may be updated based on patient status, additional functional criteria and insurance authorization.  Follow Up Recommendations Home health PT;Supervision for mobility/OOB    Equipment Recommendations  None recommended by PT    Recommendations for Other Services       Precautions / Restrictions Precautions Precautions: Fall Restrictions Weight Bearing Restrictions: No      Mobility  Bed Mobility Overal bed mobility: Needs Assistance Bed Mobility: Supine to Sit;Sit to Supine     Supine to sit: Min guard Sit to supine: Min guard   General bed mobility comments: Min guard for safety to perform bed mobility tasks on stretcher.    Transfers Overall transfer level: Needs assistance Equipment used: 1 person hand held assist Transfers: Sit to/from Stand Sit to Stand: Min guard         General transfer comment: Min guard for safety.  Ambulation/Gait Ambulation/Gait assistance: Min guard Gait Distance (Feet): 10 Feet Assistive device: 1 person hand held assist Gait Pattern/deviations: Step-to  pattern;Decreased step length - left;Decreased step length - right;Decreased weight shift to left;Antalgic Gait velocity: Decreased   General Gait Details: Mildly antalgic gait secondary to pain in L calf. HHA and min guard A for safety. Mild SOB noted. Oxygen sats dropping to 85% on 2L, but quickly returning to >90% on 2L with seated rest.  Stairs            Wheelchair Mobility    Modified Rankin (Stroke Patients Only)       Balance Overall balance assessment: Needs assistance Sitting-balance support: Feet supported;No upper extremity supported Sitting balance-Leahy Scale: Fair     Standing balance support: Single extremity supported;During functional activity Standing balance-Leahy Scale: Poor Standing balance comment: Reliant on at least 1 UE support                             Pertinent Vitals/Pain Pain Assessment: 0-10 Pain Score: 10-Worst pain ever Pain Location: cramping in calf (LLE>RLE) Pain Descriptors / Indicators: Cramping Pain Intervention(s): Limited activity within patient's tolerance;Monitored during session;Repositioned    Home Living Family/patient expects to be discharged to:: Private residence Living Arrangements: Alone Available Help at Discharge: Family;Available 24 hours/day Type of Home: Apartment Home Access: Stairs to enter Entrance Stairs-Rails: None Entrance Stairs-Number of Steps: 3 Home Layout: One level Home Equipment: Cane - single point;Walker - 4 wheels Additional Comments: Pt reports daughter can stay with her if necessary    Prior Function Level of Independence: Independent               Hand Dominance        Extremity/Trunk Assessment  Upper Extremity Assessment Upper Extremity Assessment: Defer to OT evaluation    Lower Extremity Assessment Lower Extremity Assessment: Generalized weakness    Cervical / Trunk Assessment Cervical / Trunk Assessment: Normal  Communication   Communication: No  difficulties  Cognition Arousal/Alertness: Awake/alert Behavior During Therapy: WFL for tasks assessed/performed Overall Cognitive Status: Within Functional Limits for tasks assessed                                        General Comments      Exercises     Assessment/Plan    PT Assessment Patient needs continued PT services  PT Problem List Decreased strength;Decreased activity tolerance;Decreased balance;Decreased mobility;Decreased knowledge of precautions;Pain;Cardiopulmonary status limiting activity       PT Treatment Interventions DME instruction;Gait training;Therapeutic activities;Functional mobility training;Stair training;Therapeutic exercise;Balance training;Patient/family education    PT Goals (Current goals can be found in the Care Plan section)  Acute Rehab PT Goals Patient Stated Goal: to feel better PT Goal Formulation: With patient Time For Goal Achievement: 06/30/21 Potential to Achieve Goals: Good    Frequency Min 3X/week   Barriers to discharge        Co-evaluation               AM-PAC PT "6 Clicks" Mobility  Outcome Measure Help needed turning from your back to your side while in a flat bed without using bedrails?: A Little Help needed moving from lying on your back to sitting on the side of a flat bed without using bedrails?: A Little Help needed moving to and from a bed to a chair (including a wheelchair)?: A Little Help needed standing up from a chair using your arms (e.g., wheelchair or bedside chair)?: A Little Help needed to walk in hospital room?: A Little Help needed climbing 3-5 steps with a railing? : A Lot 6 Click Score: 17    End of Session Equipment Utilized During Treatment: Gait belt Activity Tolerance: Patient limited by pain;Patient limited by fatigue Patient left: in bed;with call bell/phone within reach (on stretcher in ED) Nurse Communication: Mobility status PT Visit Diagnosis: Unsteadiness on feet  (R26.81);Muscle weakness (generalized) (M62.81);Difficulty in walking, not elsewhere classified (R26.2);Pain Pain - Right/Left:  (bilateral) Pain - part of body:  (calves)    Time: 9470-9628 PT Time Calculation (min) (ACUTE ONLY): 12 min   Charges:   PT Evaluation $PT Eval Moderate Complexity: 1 Mod          Reuel Derby, PT, DPT  Acute Rehabilitation Services  Pager: [phone] Office: [phone]   Rudean Hitt 06/16/2021, 1:45 PM

## 2021-06-17 DIAGNOSIS — I5023 Acute on chronic systolic (congestive) heart failure: Secondary | ICD-10-CM | POA: Diagnosis not present

## 2021-06-17 DIAGNOSIS — J849 Interstitial pulmonary disease, unspecified: Secondary | ICD-10-CM | POA: Diagnosis not present

## 2021-06-17 DIAGNOSIS — Z95810 Presence of automatic (implantable) cardiac defibrillator: Secondary | ICD-10-CM

## 2021-06-17 LAB — HEPATIC FUNCTION PANEL
ALT: 35 U/L (ref 0–44)
AST: 36 U/L (ref 15–41)
Albumin: 3.2 g/dL — ABNORMAL LOW (ref 3.5–5.0)
Alkaline Phosphatase: 150 U/L — ABNORMAL HIGH (ref 38–126)
Bilirubin, Direct: 0.4 mg/dL — ABNORMAL HIGH (ref 0.0–0.2)
Indirect Bilirubin: 0.6 mg/dL (ref 0.3–0.9)
Total Bilirubin: 1 mg/dL (ref 0.3–1.2)
Total Protein: 6.4 g/dL — ABNORMAL LOW (ref 6.5–8.1)

## 2021-06-17 LAB — BASIC METABOLIC PANEL
Anion gap: 9 (ref 5–15)
BUN: 23 mg/dL (ref 8–23)
CO2: 30 mmol/L (ref 22–32)
Calcium: 9.1 mg/dL (ref 8.9–10.3)
Chloride: 103 mmol/L (ref 98–111)
Creatinine, Ser: 1.07 mg/dL — ABNORMAL HIGH (ref 0.44–1.00)
GFR, Estimated: 57 mL/min — ABNORMAL LOW (ref >60)
Glucose, Bld: 111 mg/dL — ABNORMAL HIGH (ref 70–99)
Potassium: 5 mmol/L (ref 3.5–5.1)
Sodium: 142 mmol/L (ref 135–145)

## 2021-06-17 LAB — GLUCOSE, CAPILLARY
Glucose-Capillary: 90 mg/dL (ref 70–99)
Glucose-Capillary: 178 mg/dL — ABNORMAL HIGH (ref 70–99)
Glucose-Capillary: 316 mg/dL — ABNORMAL HIGH (ref 70–99)
Glucose-Capillary: 200 mg/dL — ABNORMAL HIGH (ref 70–99)

## 2021-06-17 MED ORDER — FUROSEMIDE 10 MG/ML IJ SOLN
40.0000 mg | Freq: Two times a day (BID) | INTRAMUSCULAR | Status: AC
  Administered 2021-06-17 (×2): 40 mg via INTRAVENOUS
  Filled 2021-06-17 (×2): qty 4

## 2021-06-17 MED ORDER — INSULIN ASPART 100 UNIT/ML IJ SOLN
0.0000 [IU] | Freq: Three times a day (TID) | INTRAMUSCULAR | Status: DC
  Administered 2021-06-17: 7 [IU] via SUBCUTANEOUS
  Administered 2021-06-18: 1 [IU] via SUBCUTANEOUS
  Administered 2021-06-18: 3 [IU] via SUBCUTANEOUS

## 2021-06-17 MED ORDER — METOPROLOL SUCCINATE ER 50 MG PO TB24
50.0000 mg | ORAL_TABLET | Freq: Every day | ORAL | Status: DC
  Administered 2021-06-18: 50 mg via ORAL
  Filled 2021-06-17: qty 1

## 2021-06-17 MED ORDER — POLYETHYLENE GLYCOL 3350 17 G PO PACK
17.0000 g | PACK | Freq: Two times a day (BID) | ORAL | Status: DC
  Filled 2021-06-17 (×3): qty 1

## 2021-06-17 NOTE — Progress Notes (Signed)
Heart Failure Navigator Progress Note  AHF rounding team consulted. Removed HV TOC appt.   Navigator available for reassessment of patient.   Pricilla Holm, MSN, RN Heart Failure Nurse Navigator [phone]

## 2021-06-17 NOTE — TOC Progression Note (Signed)
Transition of Care [facility] - Progression Note    Patient Details  Name: [name] MRN: [id number] Date of Birth: 05/07/1951  Transition of Care [facility]) CM/SW [place], [place] Phone Number: 06/17/2021, 3:05 PM  Clinical Narrative:    CSW spoke with the patient at bedside and completed a very brief SDOH with the patient who denied having any needs at this time. Patient reported they do have a PCP and they can get to the pharmacy to pick up their medications. CSW provided the patient with the social workers name and position and if anything changes to please reach out so that CSW can provide support.  CSW will continue to follow throughout discharge.   Expected Discharge Plan: [place] Barriers to Discharge: Continued Medical Work up  Expected Discharge Plan and Services Expected Discharge Plan: [place]   Discharge Planning Services: CM Consult Post Acute Care Choice: [place] arrangements for the past 2 months: Single Family Home                   DME Agency: NA       HH Arranged: PT           Social Determinants of Health (SDOH) Interventions Food Insecurity Interventions: Intervention Not Indicated (Pt receives food stamps) Financial Strain Interventions: Intervention Not Indicated Housing Interventions: Intervention Not Indicated Transportation Interventions: Intervention Not Indicated  Readmission Risk Interventions No flowsheet data found.  Kayton Ripp, MSW, [place] Heart Failure Social Worker

## 2021-06-17 NOTE — Consult Note (Signed)
   [facility] Inpatient Consult   06/17/2021  [name] 1951-04-18 [id number]  [place] Organization [ACO] Patient: [employer] Medicare  Primary Care Provider:  Maudie Mercury, MD, [facility] [place] [facility]   Patient was referred for [place] Management services by inpatient [facility] Neoma Laming for post hospital follow up and support. Patient will have the transition of care call conducted by the primary care provider. This patient is in an [facility] which has a chronic disease management Embedded Care Management team, patient encounters show that the patient had been in a Chronic Care Management program in the past.  Spoke with patient via bedside phone, HIPAA verified.  Explained the Embedded Care Management available services.  Patient agreed for post hospital chronic care management follow up.  Plan: A referral is to be sent to the [place] Management for post hospital follow up.   Please contact for further questions,  Natividad Brood, RN BSN [place] [facility] Liaison  [phone] business mobile phone Toll free office [phone]  Fax number: [phone] [email or web address]

## 2021-06-17 NOTE — TOC Progression Note (Addendum)
Transition of Care [facility]) - Progression Note    Patient Details  Name: ARIATNA JESTER MRN: [id number] Date of Birth: 08-Aug-1951  Transition of Care [facility]) CM/SW Contact  Zenon Mayo, RN Phone Number: 06/17/2021, 12:18 PM  Clinical Narrative:    Patient is set up with [facility] for HHPT, soc is on a delay for about 5 days, but patient is not ready until maybe 2 days.  Per pt eval today, patient does not need any HHPT follow up. NCM canceled the HHPT referral with [facility].    Expected Discharge Plan: [place] Barriers to Discharge: Continued Medical Work up  Expected Discharge Plan and Services Expected Discharge Plan: [place]   Discharge Planning Services: CM Consult Post Acute Care Choice: [place] arrangements for the past 2 months: Single Family Home                   DME Agency: NA       HH Arranged: PT           Social Determinants of Health (SDOH) Interventions Food Insecurity Interventions: Intervention Not Indicated (Pt receives food stamps) Financial Strain Interventions: Intervention Not Indicated Housing Interventions: Intervention Not Indicated Transportation Interventions: Intervention Not Indicated  Readmission Risk Interventions No flowsheet data found.

## 2021-06-17 NOTE — Progress Notes (Signed)
HD#1 SUBJECTIVE:  Patient Summary: [name] is a 70 y.o. with a pertinent PMH of hypertension, type 2 diabetes mellitus, HFrEF status post ICD (EF of 25%), gastroparesis, hyperlipidemia, who presented with shortness of breath and admitted for acute heart failure exacerbation.   Overnight Events: None  Interim History: Patient evaluated at bedside.  She reports feeling okay, better than when she was admitted yesterday.  When asked about her lower extremity edema she says it is comparable to her baseline.  On physical exam her extremities were cool to touch however she denied feeling cold.  OBJECTIVE:  Vital Signs: Vitals:   06/17/21 0910 06/17/21 0953 06/17/21 1058 06/17/21 1337  BP: (!) 141/92  126/70 128/78  Pulse: 65  63 64  Resp:   18   Temp: 97.6 F (36.4 C)  97.6 F (36.4 C)   TempSrc: Oral  Oral   SpO2: 95% 100% 100%   Weight:      Height:       Supplemental O2: Nasal Cannula SpO2: 100 % O2 Flow Rate (L/min): 2 L/min  Filed Weights   06/16/21 0448 06/16/21 1447 06/17/21 0214  Weight: 93 kg 90.1 kg 89.9 kg     Intake/Output Summary (Last 24 hours) at 06/17/2021 1350 Last data filed at 06/17/2021 1255 Gross per 24 hour  Intake 1012 ml  Output 750 ml  Net 262 ml   Net IO Since Admission: -638 mL [06/17/21 1350]  Physical Exam: Constitutional: Patient is sitting on the side of her bed.  No acute distress noted. Neck: No JVD appreciated. Cardio: Regular rate and rhythm.  S3 noted at apex. Normal pulses bilaterally radial, PT. Pulm: Crackles auscultated at bilateral lung bases.  Normal work of breathing.  Patient is on 2 L nasal cannula. Abdomen: Soft, nontender, nondistended MSK: Normal bulk and tone.  Bilateral 1+ edema. Skin: Skin is cool and dry Neuro: Alert and oriented x3.  No focal deficit noted. Psych: Appropriate mood and affect.   Patient Lines/Drains/Airways Status     Active Line/Drains/Airways     Name Placement date Placement time Site  Days   Peripheral IV 06/16/21 20 G Right Antecubital 06/16/21  0508  Antecubital  1   External Urinary Catheter 06/17/21  0002  --  less than 1   Incision (Closed) 08/12/19 Leg Right 08/12/19  1305  -- 675             ASSESSMENT/PLAN:  Assessment: Active Problems:   Tobacco use disorder   Essential hypertension   ICD (implantable cardioverter-defibrillator) in place   DMII (diabetes mellitus, type 2) (HCC)   Hyperlipidemia   Acute exacerbation of CHF (congestive heart failure) ([place])   Plan: #Acute CHF exacerbation #HFrEF (EF 25%) s/p ICD Patient presented on 9/14 with signs of acute decompensated heart failure.  She contributes this to increased fluid consumption as it is hot outside as well as diet choices from eating out more frequently recently.  She is prescribed furosemide 4 mg twice daily and spironolactone 12.5 mg daily and endorses tolerating these well with no missed doses.  Urine output since admission is 1.65 L and weight change over this time has been minimal.  She reports that her weight when checked daily at home is around 199 pounds which is about which she is now.  Echocardiogram 9/14 showed left ventricular ejection fraction 20 to 25% with severely decreased left ventricular function as well as global hypokinesis.  This is consistent with grade 2 diastolic dysfunction.  Also noted elevated pulmonary artery systolic pressures.  Left atrium was severely dilated, mitral valve degenerative changes were noted, aortic valve was noted to have calcifications.  There was aortic dilation.  Bilateral lower extremity edema was present on physical exam and all 4 extremities were cool to the touch.  On initial presentation she did have elevated troponin levels however this is likely due to to pulmonary edema.  EKG did not show evidence of acute infarct. -Trend BMP, specifically K, Mg, Phos  -Goal K greater than 4 and Mg greater than 2 while diuresing -Increase Lasix to 40 mg twice  daily -Hold losartan 25 mg daily -Continue metoprolol 100 mg daily -Strict I's/O's and daily weights -Cardiology consulted, appreciate their recommendations  -Has outpatient follow-up scheduled for 9/22 at 12 PM  #Type 2 diabetes mellitus Most recent HbA1c was 9.4%.  Of note this is a significant improvement recently with treatment including Humalog 6 units twice daily, Tresiba 28 units daily, liraglutide 1.8 mg daily, empagliflozin 10 mg daily. -Sliding scale insulin -Tresiba 28 units daily -Patient states that she will bring in her home liraglutide 1.8 mg daily as we do not have this in the hospital -Dapagliflozin 10 mg daily  #CKD stage II Baseline creatinine is around 0.9 with GFR greater than 60 with evidence of albuminuria.  Likely secondary to chronic diabetes and hypertension.  Most recent creatinine was 1.07 with GFR 57. -Strict I's/O's -Trend BMP -Avoid nephrotoxic agents when possible  #HTN #HLD #Aortic atherosclerosis Most recent LDL was 93, total cholesterol 178.  She is on atorvastatin 80 mg and aspirin 81 mg for secondary prevention of ASCVD.  She is at goal LDL of less than 100.  She is normotensive at this time; we will adjust hypertension medications accordingly. -Continue atorvastatin 10 mg daily and aspirin 81 mg daily -Hold losartan 25 mg daily -Continue metoprolol 100 mg daily  #Tobacco use disorder -Continue Wellbutrin  Best Practice: Diet: Cardiac diet IVF: Fluids: None VTE: enoxaparin (LOVENOX) injection 40 mg Start: 06/16/21 1600 Code: Full AB: None Therapy Recs: None, DME: shower chair DISPO: Anticipated discharge  2-3 days  to Home pending Medical stability.  Signature: Farrel Gordon, D.O.  Internal Medicine Resident, PGY-1 [facility]  Pager: [phone] 1:50 PM, 06/17/2021   Please contact the on call pager after 5 pm and on weekends at [phone].

## 2021-06-17 NOTE — Progress Notes (Signed)
Physical Therapy Treatment Patient Details Name: [name] MRN: [id number] DOB: Jul 07, 1951 Today's Date: 06/17/2021   History of Present Illness Pt is a 70 y/o female admitted 9/14 secondary to SOB. Thought to be secondary to CHF exacerbation. PMH includes CAD s/p stent, HTN, PAD.    PT Comments    Patient reports no pain and much improved mobility this session. Appears that she is at or near baseline level of function. Continue skilled PT while here to improve activity tolerance and safety.     Recommendations for follow up therapy are one component of a multi-disciplinary discharge planning process, led by the attending physician.  Recommendations may be updated based on patient status, additional functional criteria and insurance authorization.  Follow Up Recommendations  No PT follow up     Equipment Recommendations  Other (comment) (shower chair)    Recommendations for Other Services       Precautions / Restrictions Precautions Precaution Comments: mod fall Restrictions Weight Bearing Restrictions: No     Mobility  Bed Mobility               General bed mobility comments: patient received sitting up on side of bed    Transfers Overall transfer level: Independent Equipment used: None Transfers: Sit to/from Stand Sit to Stand: Independent            Ambulation/Gait Ambulation/Gait assistance: Supervision Gait Distance (Feet): 200 Feet Assistive device: None Gait Pattern/deviations: Step-through pattern Gait velocity: WFL   General Gait Details: patient with much improved ambulation this date. She has no pain this morning.  One brief standing rest break during ambulation due to fatigue and sob. O2 sats at 100% at end of session on 2 lpm.   Stairs             Wheelchair Mobility    Modified Rankin (Stroke Patients Only)       Balance Overall balance assessment: Independent Sitting-balance support: Feet supported Sitting  balance-Leahy Scale: Normal     Standing balance support: No upper extremity supported;During functional activity Standing balance-Leahy Scale: Good                              Cognition Arousal/Alertness: Awake/alert Behavior During Therapy: WFL for tasks assessed/performed Overall Cognitive Status: Within Functional Limits for tasks assessed                                        Exercises      General Comments        Pertinent Vitals/Pain Pain Assessment: No/denies pain    Home Living                      Prior Function            PT Goals (current goals can now be found in the care plan section) Acute Rehab PT Goals Patient Stated Goal: to feel better, return home PT Goal Formulation: With patient Time For Goal Achievement: 06/24/21 Potential to Achieve Goals: Good Progress towards PT goals: Progressing toward goals    Frequency    Min 3X/week      PT Plan Discharge plan needs to be updated    Co-evaluation              AM-PAC PT "6 Clicks" Mobility   Outcome Measure  Help needed turning from your back to your side while in a flat bed without using bedrails?: None Help needed moving from lying on your back to sitting on the side of a flat bed without using bedrails?: None Help needed moving to and from a bed to a chair (including a wheelchair)?: None Help needed standing up from a chair using your arms (e.g., wheelchair or bedside chair)?: None Help needed to walk in hospital room?: A Little Help needed climbing 3-5 steps with a railing? : A Little 6 Click Score: 22    End of Session Equipment Utilized During Treatment: Oxygen Activity Tolerance: Patient tolerated treatment well Patient left: in bed;with call bell/phone within reach Nurse Communication: Mobility status PT Visit Diagnosis: Muscle weakness (generalized) (M62.81)     Time: 9480-1655 PT Time Calculation (min) (ACUTE ONLY): 11  min  Charges:  $Gait Training: 8-22 mins                     [employer], PT, GCS 06/17/21,10:01 AM

## 2021-06-17 NOTE — Consult Note (Addendum)
Advanced Heart Failure Team Consult Note   Primary Physician: Maudie Mercury, MD PCP-Cardiologist:  None  Reason for Consultation: A/C Systolic Heart Failure  HPI:    [name] is seen today for evaluation of A/C Systolic Heart Failure  at the request of Dr Carlis Abbott.   Ms Jagiello is a 70 year old with history of DMII, COPD, chronic respiratory failure on home oxygen, DMII, HTN, St Jude ICD, tobacco abuse, NICM, and chronic systolic heart failure.   Prior to admit she was eating and drinking a lot of fluid. On Monday she developed shortness of breath.   Presented to [facility] with increased shortness of breath. CXR-->CHF. Pertinent admission labs included: lactic acid 1.6, WBC 10.5, Hgb 12.5, creatinine 1.1, K 4.3, HS Trop 76>67, BNP 262, and mag 2.1 . Started on IV lasix. Had ECHO that showed EF 20-25% and normal RV.   St Jude interrogation - Corvue with impedance down for 8 days and now impedance back up after IV diuresis.    Cardiac Studies  Echo this admit EF 20-25% RV normal , LA severely reduced,  Echo 2018 EF 25-30% Grade IDD  LHC 2017 Normal Coronaries.   Review of Systems: [y] = yes, _0  = no   General: Weight gain _1 ; Weight loss _2 ; Anorexia _3 ; Fatigue _4 ; Fever _5 ; Chills _6 ; Weakness _7   Cardiac: Chest pain/pressure _8 ; Resting SOB _9 Y; Exertional SOB [Y ]; Orthopnea _10 ; Pedal Edema _11 ; Palpitations _12 ; Syncope _13 ; Presyncope _14 ; Paroxysmal nocturnal dyspnea_15   Pulmonary: Cough _16 ; Wheezing_17 ; Hemoptysis_18 ; Sputum _19 ; Snoring _20   GI: Vomiting_21 ; Dysphagia_22 ; Melena_23 ; Hematochezia _24 ; Heartburn_25 ; Abdominal pain _26 ; Constipation _27 ; Diarrhea _28 ; BRBPR _29   GU: Hematuria_30 ; Dysuria _31 ; Nocturia_32   Vascular: Pain in legs with walking _33 ; Pain in feet with lying flat _34 ; Non-healing sores _35 ; Stroke _36 ; TIA _37 ; Slurred speech _38 ;  Neuro: Headaches_39 ; Vertigo_40 ; Seizures_41 ; Paresthesias_42 ;Blurred vision _43 ; Diplopia _44 ; Vision changes [  ]  Ortho/Skin: Arthritis _45 ; Joint pain _46 ; Muscle pain _47 ; Joint swelling _48 ; Back Pain [ Y]; Rash _49   Psych: Depression_50 ; Anxiety_51   Heme: Bleeding problems _52 ; Clotting disorders _53 ; Anemia [ Y]  Endocrine: Diabetes [ Y]; Thyroid dysfunction_54   Home Medications Prior to Admission medications   Medication Sig Start Date End Date Taking? Authorizing Provider  albuterol (VENTOLIN HFA) 108 (90 Base) MCG/ACT inhaler Inhale 2 puffs into the lungs every 6 (six) hours as needed for wheezing or shortness of breath. 04/28/21  Yes Maudie Mercury, MD  aspirin EC 81 MG tablet Take 1 tablet (81 mg total) by mouth 2 (two) times daily. Patient taking differently: Take 81 mg by mouth daily. 08/12/19  Yes Leandrew Koyanagi, MD  atorvastatin (LIPITOR) 80 MG tablet Take 1 tablet (80 mg total) by mouth at bedtime. Patient taking differently: Take 80 mg by mouth daily. 12/26/19  Yes Kathi Ludwig, MD  buPROPion (WELLBUTRIN XL) 150 MG 24 hr tablet Take 1 tablet (150 mg total) by mouth daily for 3 days, THEN 1 tablet (150 mg total) in the morning and at bedtime for 27 days. 05/24/21 06/23/21 Yes Marianna Payment, MD  calcium-vitamin D (OSCAL WITH D) 500-200 MG-UNIT tablet Take 1 tablet by mouth  2 (two) times daily. 02/12/20  Yes Kathi Ludwig, MD  clobetasol ointment (TEMOVATE) 1.61 % Apply 1 application topically 2 (two) times daily as needed (Skin rash).  12/17/19  Yes [provider]  dapagliflozin propanediol (FARXIGA) 10 MG TABS tablet Take 1 tablet (10 mg total) by mouth daily. 02/04/21  Yes Asencion Noble, MD  furosemide (LASIX) 40 MG tablet Take 1 tablet (40 mg total) by mouth 2 (two) times daily. 02/18/21  Yes Asencion Noble, MD  insulin degludec (TRESIBA FLEXTOUCH) 100 UNIT/ML FlexTouch Pen Inject 28 Units into the skin daily. Patient taking differently: Inject 22 Units into the skin daily. 02/04/21  Yes Asencion Noble, MD  insulin lispro (HUMALOG) 100 UNIT/ML injection Inject  0.06 mLs (6 Units total) into the skin 2 (two) times daily with a meal. Patient taking differently: Inject 6 Units into the skin with breakfast, with lunch, and with evening meal. 02/24/21  Yes Asencion Noble, MD  liraglutide (VICTOZA) 18 MG/3ML SOPN Inject 1.58m daily into the skin Patient taking differently: Inject 1.8 mg into the skin daily. Inject 1.860mdaily into the skin 12/26/19  Yes Harbrecht, LaPurcell NailsMD  losartan (COZAAR) 25 MG tablet Take 1 tablet (25 mg total) by mouth daily. 02/04/21  Yes KrAsencion NobleMD  metoprolol succinate (TOPROL-XL) 50 MG 24 hr tablet Take 2 tablets (100 mg total) by mouth daily. 12/26/19  Yes HaKathi LudwigMD  spironolactone (ALDACTONE) 25 MG tablet TAKE 0.5 TABLETS BY MOUTH EVERY DAY Patient taking differently: Take 12.5 mg by mouth daily. 12/26/19  Yes HaKathi LudwigMD  umeclidinium-vilanterol (ANORO ELLIPTA) 62.5-25 MCG/INH AEPB Inhale 1 puff into the lungs daily. 02/18/21  Yes KrAsencion NobleMD  Blood Glucose Monitoring Suppl (ACCU-CHEK GUIDE ME) w/Device KIT by Does not apply route.    [provider]  Insulin Pen Needle (PEN NEEDLES) 32G X 4 MM MISC Use to inject insulin 3 times times per day 02/18/21   KrAsencion NobleMD  Lancets Misc. (ACCU-CHEK FASTCLIX LANCET) KIT Lancing device, use with lancets for blood glucose monitoring. 07/17/18   HaKathi LudwigMD  Lancets MISC 1 Act by Does not apply route 4 (four) times daily. 07/11/18   HaKathi LudwigMD  nitroGLYCERIN (NITROSTAT) 0.4 MG SL tablet Place 1 tablet (0.4 mg total) under the tongue every 5 (five) minutes as needed for chest pain. Reported on 03/18/2016 Patient not taking: Reported on 06/16/2021 06/27/18   KiForde DandyPharmD    Past Medical History: Past Medical History:  Diagnosis Date   ATN (acute tubular necrosis) (HCPatrick AFB10/13/2015   Automatic implantable cardioverter-defibrillator in situ    Automatic implantable cardioverter-defibrillator in  situ 05/04/2007   Qualifier: Diagnosis of  By: CrIsla Pence   Cardiac defibrillator in situ    Atlas II VR (SJM) implanted by Dr TaLovena Le CHF (congestive heart failure) (HCEmpire   Chronic combined systolic and diastolic heart failure (HC[place]   a. EF 35-40% in past;  b. Echo 7/13:  EF 45-50%, Gr 2 diast dysfn, mild AI, mild MAC, trivial MR, mild LAE, PASP 47.   Chronic ulcer of leg (HCCar[place]   04-09-15 resolved-not a problem.   Colon polyps 04/12/2013   Rectosigmoid polyp   COPD (chronic obstructive pulmonary disease) (HCC)    O2 at night   Depression    Diabetes mellitus    Diabetes mellitus, type 2 (HCGarfield7/11/2011   HIGH RISK FEET.. Please have patient take  shoes and socks off every visit for visual foot inspection.     Eczema    Elevated alkaline phosphatase level    GGT and 5'nucleotidase 8/13 normal   Health care maintenance 01/22/2013   Surgically absent cervix- no pap needed (Path report 07/2000: uterine body with attached bilateral  adnexa and separate cervix.)   History of oxygen administration    oxygen @ 2 l/m nasally bedtime 24/7   HTN (hypertension)    Hx of cardiac cath    a. [place] 2003 normal;  b. LHC 6/13:  Mild calcification in the LM, o/w normal coronary arteries, EF 45%.    Hyperkalemia 08/08/2017   Hyperlipidemia    Elevated triglyceride in 2019   Hyperlipidemia associated with type 2 diabetes mellitus ([place]) 05/25/2007   Qualifier: Diagnosis of  By: Hassell Done FNP, [place]     Hyperthyroidism, subclinical    HYPERTHYROIDISM, SUBCLINICAL 05/06/2009   Qualifier: Diagnosis of  By: Darrick Meigs     Implantable cardioverter-defibrillator (ICD) generator end of life    Lipoma    Need for hepatitis C screening test 04/25/2019   NICM (nonischemic cardiomyopathy) ([place])    Obesity    On home oxygen therapy    "2L; 24/7" (10/11/2016)   Post menopausal syndrome    Shortness of breath 07/14/2017   Skin rash 07/12/2018   Sleep apnea    pt denies 04/12/2013    Past Surgical  History: Past Surgical History:  Procedure Laterality Date   ABDOMINAL HYSTERECTOMY     CARDIAC CATHETERIZATION     CARDIAC CATHETERIZATION N/A 06/21/2016   Procedure: Left Heart Cath and Coronary Angiography;  Surgeon: Sherren Mocha, MD;  Location: [facility];  Service: Cardiovascular;  Laterality: N/A;   CARDIAC DEFIBRILLATOR PLACEMENT  05/04/2007   SJM Atlas II VR ICD   CARDIAC DEFIBRILLATOR PLACEMENT     CATARACT EXTRACTION     od   COLONOSCOPY N/A 04/12/2013   Procedure: COLONOSCOPY;  Surgeon: Beryle Beams, MD;  Location: [facility];  Service: Endoscopy;  Laterality: N/A;  pt.has defibrilator   COLONOSCOPY N/A 04/16/2015   Procedure: COLONOSCOPY;  Surgeon: [name], MD;  Location: [facility];  Service: Endoscopy;  Laterality: N/A;   ESOPHAGOGASTRODUODENOSCOPY N/A 04/16/2015   Procedure: ESOPHAGOGASTRODUODENOSCOPY (EGD);  Surgeon: [name], MD;  Location: [facility];  Service: Endoscopy;  Laterality: N/A;   EYE SURGERY     HERNIA REPAIR     HYSTEROSCOPY     IMPLANTABLE CARDIOVERTER DEFIBRILLATOR (ICD) GENERATOR CHANGE N/A 04/02/2014   Procedure: ICD GENERATOR CHANGE;  Surgeon: Evans Lance, MD;  Location: [facility] C[facility] LAB;  Service: Cardiovascular;  Laterality: N/A;   INSERT / REPLACE / REMOVE PACEMAKER     LEFT HEART CATHETERIZATION WITH CORONARY ANGIOGRAM N/A 04/02/2012   Procedure: LEFT HEART CATHETERIZATION WITH CORONARY ANGIOGRAM;  Surgeon: Hillary Bow, MD;  Location: [facility] CATH LAB;  Service: Cardiovascular;  Laterality: N/A;   ORIF ANKLE FRACTURE Right 08/12/2019   Procedure: OPEN REDUCTION INTERNAL FIXATION (ORIF) RIGHT TRIMALLEOLAR ANKLE FRACTURE;  Surgeon: Leandrew Koyanagi, MD;  Location: [facility];  Service: Orthopedics;  Laterality: Right;   TONSILLECTOMY     TUBAL LIGATION      Family History: Family History  Problem Relation Age of Onset   Stroke Mother    Seizures Father    Heart disease Father    Diabetes Sister    Asthma Maternal Aunt         aunts   Asthma Maternal  Uncle        uncles   Heart disease Paternal Aunt        aunts   Heart disease Paternal Uncle        uncles   Heart disease Maternal Aunt        aunts   Heart disease Maternal Uncle        uncles   Heart disease Maternal Grandfather    Breast cancer Daughter    Breast cancer Cousin    Asthma Grandchild    Colon cancer Neg Hx    Colon polyps Neg Hx    Esophageal cancer Neg Hx    Kidney disease Neg Hx    Gallbladder disease Neg Hx     Social History: Social History   Socioeconomic History   Marital status: Significant Other    Spouse name: Not on file   Number of children: 3   Years of education: 11   Highest education level: 11th grade  Occupational History   Occupation: disabled  Tobacco Use   Smoking status: Every Day    Packs/day: 0.50    Years: 45.00    Pack years: 22.50    Types: Cigarettes   Smokeless tobacco: Never   Tobacco comments:    currently smoking 3-4 cigs per day  Vaping Use   Vaping Use: Never used  Substance and Sexual Activity   Alcohol use: Not Currently    Alcohol/week: 3.0 standard drinks    Types: 3 Shots of liquor per week   Drug use: No   Sexual activity: Yes    Partners: Male    Birth control/protection: Surgical  Other Topics Concern   Not on file  Social History Narrative   ** Merged History Encounter **       Married   Patient enjoys going to [employer], church and spending time with family   Social Determinants of Health   Financial Resource Strain: Low Risk    Difficulty of Paying Living Expenses: Not very hard  Food Insecurity: No Food Insecurity   Worried About Charity fundraiser in the Last Year: Never true   Arboriculturist in the Last Year: Never true  Transportation Needs: No Transportation Needs   Lack of Transportation (Medical): No   Lack of Transportation (Non-Medical): No  Physical Activity: Not on file  Stress: Not on file  Social Connections: Not on file    Allergies:   Allergies  Allergen Reactions   Actos [Pioglitazone] Other (See Comments)    REACTION: congestive heart failure    Naproxen Other (See Comments)    576m dose made her sleep for two days   Rosiglitazone Hives   Tomato Itching   Hydrocodone-Acetaminophen Itching   Hydrocortisone Hives, Itching and Other (See Comments)    unknown    Objective:    Vital Signs:   Temp:  [97.4 F (36.3 C)-98.4 F (36.9 C)] 97.6 F (36.4 C) (09/15 1058) Pulse Rate:  [63-71] 64 (09/15 1337) Resp:  [16-23] 18 (09/15 1058) BP: (119-150)/(70-111) 128/78 (09/15 1337) SpO2:  [94 %-100 %] 100 % (09/15 1058) Weight:  [89.9 kg-90.1 kg] 89.9 kg (09/15 0214) Last BM Date: 06/17/21  Weight change: Filed Weights   06/16/21 0448 06/16/21 1447 06/17/21 0214  Weight: 93 kg 90.1 kg 89.9 kg    Intake/Output:   Intake/Output Summary (Last 24 hours) at 06/17/2021 1345 Last data filed at 06/17/2021 1255 Gross per 24 hour  Intake 1012 ml  Output 750  ml  Net 262 ml      Physical Exam    General:  Sitting on the side of the bed. No resp difficulty HEENT: normal Neck: supple. JVP  6-7 . Carotids 2+ bilat; no bruits. No lymphadenopathy or thyromegaly appreciated. Cor: PMI nondisplaced. Regular rate & rhythm. No rubs, gallops or murmurs. Lungs: clear Abdomen: soft, nontender, nondistended. No hepatosplenomegaly. No bruits or masses. Good bowel sounds. Extremities: cool lower extremities, no cyanosis, clubbing, rash, trace lower extremity edema Neuro: alert & orientedx3, cranial nerves grossly intact. moves all 4 extremities w/o difficulty. Affect pleasant   Telemetry   SR 70s  EKG    SR 72 bpm QRS 104 ms   Labs   Basic Metabolic Panel: Recent Labs  Lab 06/16/21 0502 06/16/21 0907 06/17/21 0411  NA 137  --  142  K 4.3  --  5.0  CL 102  --  103  CO2 26  --  30  GLUCOSE 333*  --  111*  BUN 15  --  23  CREATININE 1.08*  --  1.07*  CALCIUM 9.4  --  9.1  MG  --  2.1  --   PHOS  --  4.5  --      Liver Function Tests: Recent Labs  Lab 06/16/21 0502 06/17/21 0411  AST 64* 36  ALT 43 35  ALKPHOS 152* 150*  BILITOT 0.7 1.0  PROT 6.8 6.4*  ALBUMIN 3.5 3.2*   No results for input(s): LIPASE, AMYLASE in the last 168 hours. No results for input(s): AMMONIA in the last 168 hours.  CBC: Recent Labs  Lab 06/16/21 0502 06/16/21 0907  WBC 10.5 9.2  NEUTROABS 7.3  --   HGB 12.6 12.8  HCT 41.3 41.9  MCV 85.9 85.7  PLT 258 180    Cardiac Enzymes: No results for input(s): CKTOTAL, CKMB, CKMBINDEX, TROPONINI in the last 168 hours.  BNP: BNP (last 3 results) Recent Labs    06/16/21 0502  BNP 261.8*    ProBNP (last 3 results) No results for input(s): PROBNP in the last 8760 hours.   CBG: Recent Labs  Lab 06/16/21 1457 06/16/21 1654 06/16/21 2345 06/17/21 0728 06/17/21 1100  GLUCAP 239* 224* 153* 90 178*    Coagulation Studies: No results for input(s): LABPROT, INR in the last 72 hours.   Imaging   No results found.   Medications:     Current Medications:  aspirin EC  81 mg Oral Daily   atorvastatin  80 mg Oral QHS   buPROPion  150 mg Oral BID   dapagliflozin propanediol  10 mg Oral Daily   enoxaparin (LOVENOX) injection  40 mg Subcutaneous Q24H   furosemide  40 mg Intravenous BID   insulin aspart  0-9 Units Subcutaneous TID WC   insulin glargine-yfgn  28 Units Subcutaneous Daily   metoprolol succinate  100 mg Oral Daily   polyethylene glycol  17 g Oral BID   sodium chloride flush  3 mL Intravenous Q12H   umeclidinium-vilanterol  1 puff Inhalation Daily    Infusions:  sodium chloride        Patient Profile   Ms Hatfield is a 70 year old with history of DMII, COPD, chronic respiratory failure on home oxygen, DMII, HTN, St Jude ICD, tobacco abuse, NICM, and chronic systolic heart failure.   Assessment/Plan   A/C Systolic Heart Failure, NICM  -Had LHC 2017 normal coronaries. Has Single [employer] Jude ICD.  -Echo EF 20-25% RV normal   -  CXR with CHF. BNP 261.  -Device interrogation - suggestive fluid accumulation but back to normal with diuresis.  Suspect volume overload due to diet/fluid intake. Extremities are cool but dont think this is low output with stable heart rate/bp.  - Needs GDMT but limited with elevated potassium.   - Stop IV lasix. Tomorrow start back 40 mg po lasix.  On Toprol XL 100 mg daily -- cut back Toprol 50 mg daily  Continue farxiga 10 mg daily  Potassium 5 so will hold off on spiro and entresto. Would like to try and start entresto tomorrow.  - Renal function stable.   Chronic Respiratory Failure On 2-3 liters oxygen as needed. Sats stable on room air.   DMII Hgb A1C 9.4  On SSI  Tobacco Abuse Discussed smoking cessation.   Consult cardiac rehab.   Length of Stay: 1  Amy Clegg, NP  06/17/2021, 1:45 PM  Advanced Heart Failure Team Pager [phone] (M-F; 7a - 5p)  Please contact [facility] for night-coverage after hours (4p -7a ) and weekends on [email or web address]  Patient seen and examined with the above-signed Advanced Practice Provider and/or Housestaff. I personally reviewed laboratory data, imaging studies and relevant notes. I independently examined the patient and formulated the important aspects of the plan. I have edited the note to reflect any of my changes or salient points. I have personally discussed the plan with the patient and/or family.  70 y/o woman smoker with DM2 and chronic systolic HF due to NICM EF 25-30% admitted with ADHF in setting of dietary noncompliance.  At baseline lives alone and is quite independent.   Had one episode of brief CP earlier in week. None since. Hstrop and ecg ok.   Feels better with diuresis. ICD interrogation shows elevated volume that is no improved. No VT/AF  Echo here EF 25% Personally reviewed  General:  Sitting up on side of bed No resp difficulty HEENT: normal Neck: supple. JVP 6-7 Carotids 2+ bilat; no bruits. No lymphadenopathy or  thryomegaly appreciated. Cor: PMI nondisplaced. Regular rate & rhythm. No rubs, gallops or murmurs. Lungs: clear Abdomen: soft, nontender, nondistended. No hepatosplenomegaly. No bruits or masses. Good bowel sounds. Extremities: no cyanosis, clubbing, rash, trace edema + cool distally Neuro: alert & orientedx3, cranial nerves grossly intact. moves all 4 extremities w/o difficulty. Affect pleasant  Suspect this is mostly due to volume overload but I also have some concern for low output. Will adjust meds as above. If feeling ok tomorrow can go home with outpatient HF f/u. If feels worse or labs worse will consider R/L cath.   Glori Bickers, MD  3:42 PM

## 2021-06-17 NOTE — Evaluation (Signed)
Occupational Therapy Evaluation Patient Details Name: [name] MRN: [id number] DOB: [birth year] Today's Date: 06/17/2021   History of Present Illness Pt is a 70 y/o female admitted 9/14 secondary to SOB. Thought to be secondary to CHF exacerbation. PMH includes CAD s/p stent, HTN, PAD.   Clinical Impression   Pt is functioning independently in mobility within her room and routinely taking herself to the bathroom. She is modified independent in ADL. Educated in energy conservation and provided written handout. Pt could benefit from tub equipment at home. No further OT needs.      Recommendations for follow up therapy are one component of a multi-disciplinary discharge planning process, led by the attending physician.  Recommendations may be updated based on patient status, additional functional criteria and insurance authorization.   Follow Up Recommendations  No OT follow up    Equipment Recommendations  Tub/shower seat    Recommendations for Other Services       Precautions / Restrictions Precautions Precautions: Fall Precaution Comments: mod fall Restrictions Weight Bearing Restrictions: No      Mobility Bed Mobility               General bed mobility comments: patient received sitting up on side of bed    Transfers Overall transfer level: Independent Equipment used: None Transfers: Sit to/from Stand Sit to Stand: Independent              Balance Overall balance assessment: Independent Sitting-balance support: Feet supported Sitting balance-Leahy Scale: Normal     Standing balance support: No upper extremity supported;During functional activity Standing balance-Leahy Scale: Good                             ADL either performed or assessed with clinical judgement   ADL Overall ADL's : Modified independent                                       General ADL Comments: Educated in energy conservation and provided  written handout to reinforce.     Vision Patient Visual Report: No change from baseline       Perception     Praxis      Pertinent Vitals/Pain Pain Assessment: No/denies pain     Hand Dominance Right   Extremity/Trunk Assessment Upper Extremity Assessment Upper Extremity Assessment: Overall WFL for tasks assessed   Lower Extremity Assessment Lower Extremity Assessment: Defer to PT evaluation   Cervical / Trunk Assessment Cervical / Trunk Assessment: Normal   Communication Communication Communication: No difficulties   Cognition Arousal/Alertness: Awake/alert Behavior During Therapy: WFL for tasks assessed/performed Overall Cognitive Status: Within Functional Limits for tasks assessed                                     General Comments       Exercises     Shoulder Instructions      Home Living Family/patient expects to be discharged to:: Private residence Living Arrangements: Alone Available Help at Discharge: Family;Available 24 hours/day Type of Home: Apartment Home Access: Stairs to enter Entrance Stairs-Number of Steps: 3 Entrance Stairs-Rails: None Home Layout: One level     Bathroom Shower/Tub: Teacher, early years/pre: Standard     Home Equipment: Cane - single point;Walker -  4 wheels;Other (comment) (02 for night)   Additional Comments: Pt reports daughter can stay with her if necessary      Prior Functioning/Environment Level of Independence: Independent                 OT Problem List:        OT Treatment/Interventions:      OT Goals(Current goals can be found in the care plan section) Acute Rehab OT Goals Patient Stated Goal: to feel better, return home OT Goal Formulation: With patient  OT Frequency:     Barriers to D/C:            Co-evaluation              AM-PAC OT "6 Clicks" Daily Activity     Outcome Measure Help from another person eating meals?: None Help from another person  taking care of personal grooming?: None Help from another person toileting, which includes using toliet, bedpan, or urinal?: None Help from another person bathing (including washing, rinsing, drying)?: None Help from another person to put on and taking off regular upper body clothing?: None Help from another person to put on and taking off regular lower body clothing?: None 6 Click Score: 24   End of Session    Activity Tolerance: Patient tolerated treatment well Patient left: in bed;with call bell/phone within reach  OT Visit Diagnosis: Other (comment) (decreased activity tolerance)                Time: 8250-5397 OT Time Calculation (min): 15 min Charges:  OT General Charges $OT Visit: 1 Visit OT Evaluation $OT Eval Low Complexity: 1 Low  Nestor Lewandowsky, OTR/L Acute Rehabilitation Services Pager: [phone] Office: [phone]  Malka So 06/17/2021, 10:18 AM

## 2021-06-18 ENCOUNTER — Other Ambulatory Visit: Payer: Self-pay | Admitting: Internal Medicine

## 2021-06-18 ENCOUNTER — Ambulatory Visit: Payer: Medicare Other | Admitting: Pharmacist

## 2021-06-18 ENCOUNTER — Other Ambulatory Visit (HOSPITAL_COMMUNITY): Payer: Self-pay

## 2021-06-18 DIAGNOSIS — E781 Pure hyperglyceridemia: Secondary | ICD-10-CM

## 2021-06-18 DIAGNOSIS — I10 Essential (primary) hypertension: Secondary | ICD-10-CM

## 2021-06-18 DIAGNOSIS — I5023 Acute on chronic systolic (congestive) heart failure: Secondary | ICD-10-CM | POA: Diagnosis not present

## 2021-06-18 DIAGNOSIS — E1142 Type 2 diabetes mellitus with diabetic polyneuropathy: Secondary | ICD-10-CM

## 2021-06-18 LAB — BASIC METABOLIC PANEL
Anion gap: 19 — ABNORMAL HIGH (ref 5–15)
BUN: 24 mg/dL — ABNORMAL HIGH (ref 8–23)
CO2: 25 mmol/L (ref 22–32)
Calcium: 9.1 mg/dL (ref 8.9–10.3)
Chloride: 96 mmol/L — ABNORMAL LOW (ref 98–111)
Creatinine, Ser: 1.16 mg/dL — ABNORMAL HIGH (ref 0.44–1.00)
GFR, Estimated: 52 mL/min — ABNORMAL LOW (ref >60)
Glucose, Bld: 161 mg/dL — ABNORMAL HIGH (ref 70–99)
Potassium: 4.5 mmol/L (ref 3.5–5.1)
Sodium: 140 mmol/L (ref 135–145)

## 2021-06-18 LAB — GLUCOSE, CAPILLARY
Glucose-Capillary: 136 mg/dL — ABNORMAL HIGH (ref 70–99)
Glucose-Capillary: 240 mg/dL — ABNORMAL HIGH (ref 70–99)

## 2021-06-18 MED ORDER — FUROSEMIDE 40 MG PO TABS: ORAL_TABLET | ORAL | 1 refills | Status: DC

## 2021-06-18 MED ORDER — SACUBITRIL-VALSARTAN 24-26 MG PO TABS
1.0000 | ORAL_TABLET | Freq: Two times a day (BID) | ORAL | Status: DC
  Administered 2021-06-18: 1 via ORAL
  Filled 2021-06-18: qty 1

## 2021-06-18 MED ORDER — SACUBITRIL-VALSARTAN 24-26 MG PO TABS
1.0000 | ORAL_TABLET | Freq: Two times a day (BID) | ORAL | 1 refills | Status: DC
  Filled 2021-06-18: qty 60, 30d supply, fill #0

## 2021-06-18 MED ORDER — SACUBITRIL-VALSARTAN 24-26 MG PO TABS: 1.0000 | ORAL_TABLET | Freq: Two times a day (BID) | ORAL | 1 refills | Status: DC

## 2021-06-18 MED ORDER — METOPROLOL SUCCINATE ER 50 MG PO TB24
50.0000 mg | ORAL_TABLET | Freq: Every day | ORAL | 1 refills | Status: DC
  Filled 2021-06-18: qty 30, 30d supply, fill #0

## 2021-06-18 MED ORDER — METOPROLOL SUCCINATE ER 50 MG PO TB24
50.0000 mg | ORAL_TABLET | Freq: Every day | ORAL | 1 refills | Status: DC
Start: 2021-06-19 — End: 2021-06-18

## 2021-06-18 MED ORDER — FUROSEMIDE 40 MG PO TABS
ORAL_TABLET | ORAL | 1 refills | Status: DC
  Filled 2021-06-18: qty 75, 30d supply, fill #0

## 2021-06-18 MED ORDER — FUROSEMIDE 40 MG PO TABS
40.0000 mg | ORAL_TABLET | Freq: Once | ORAL | Status: AC
  Administered 2021-06-18: 40 mg via ORAL
  Filled 2021-06-18: qty 1

## 2021-06-18 NOTE — Progress Notes (Addendum)
Advanced Heart Failure Rounding Note  PCP-Cardiologist: None    Patient Profile   [name] is a 70 year old AAF with history of NICM/chronic systolic HF s/p [employer] ICD, DM II, chronic respiratory failure on home 02, COPD with tobacco use. Admitted on 09/14 with A/C CHF. AHF consulted to assist with management.  Subjective:     Feeling well today. No dyspnea at rest. Denies CP.  BP okay  Scr 1.04 > 1.07 > 1.16  K 4.5   Objective:   Weight Range: 89.9 kg Body mass index is 36.25 kg/m.   Vital Signs:   Temp:  [97.4 F (36.3 C)-97.9 F (36.6 C)] 97.7 F (36.5 C) (09/16 0732) Pulse Rate:  [58-66] 58 (09/16 0732) Resp:  [17-18] 17 (09/16 0344) BP: (123-132)/(70-85) 124/85 (09/16 0732) SpO2:  [68 %-100 %] 100 % (09/16 0732) Weight:  [89.9 kg] 89.9 kg (09/16 0344) Last BM Date: 06/17/21  Weight change: Filed Weights   06/16/21 1447 06/17/21 0214 06/09/2021 0344  Weight: 90.1 kg 89.9 kg 89.9 kg    Intake/Output:   Intake/Output Summary (Last 24 hours) at 06/23/2021 0928 Last data filed at 06/10/2021 0847 Gross per 24 hour  Intake 1414 ml  Output 2100 ml  Net -686 ml      Physical Exam    General:  sitting up in chair. No acute distress. HEENT: Normal Neck: Supple. JVP ~ 8-9. Carotids 2+ bilat; no bruits. No lymphadenopathy or thyromegaly appreciated. Cor: PMI nondisplaced. Regular rate & rhythm. No rubs, gallops or murmurs. Lungs: Bibasilar crackles Abdomen: Soft, nontender, nondistended. No hepatosplenomegaly. No bruits or masses. Good bowel sounds. Extremities: No cyanosis, clubbing, rash, edema Neuro: Alert & orientedx3, cranial nerves grossly intact. moves all 4 extremities w/o difficulty. Affect pleasant   Telemetry   NSR 60s  EKG    No new  Labs    CBC Recent Labs    06/16/21 0502 06/16/21 0907  WBC 10.5 9.2  NEUTROABS 7.3  --   HGB 12.6 12.8  HCT 41.3 41.9  MCV 85.9 85.7  PLT 258 253   Basic Metabolic Panel Recent Labs     06/16/21 0907 06/17/21 0411 06/27/2021 0239  NA  --  142 140  K  --  5.0 4.5  CL  --  103 96*  CO2  --  30 25  GLUCOSE  --  111* 161*  BUN  --  23 24*  CREATININE  --  1.07* 1.16*  CALCIUM  --  9.1 9.1  MG 2.1  --   --   PHOS 4.5  --   --    Liver Function Tests Recent Labs    06/16/21 0502 06/17/21 0411  AST 64* 36  ALT 43 35  ALKPHOS 152* 150*  BILITOT 0.7 1.0  PROT 6.8 6.4*  ALBUMIN 3.5 3.2*   No results for input(s): LIPASE, AMYLASE in the last 72 hours. Cardiac Enzymes No results for input(s): CKTOTAL, CKMB, CKMBINDEX, TROPONINI in the last 72 hours.  BNP: BNP (last 3 results) Recent Labs    06/16/21 0502  BNP 261.8*    ProBNP (last 3 results) No results for input(s): PROBNP in the last 8760 hours.   D-Dimer No results for input(s): DDIMER in the last 72 hours. Hemoglobin A1C No results for input(s): HGBA1C in the last 72 hours. Fasting Lipid Panel No results for input(s): CHOL, HDL, LDLCALC, TRIG, CHOLHDL, LDLDIRECT in the last 72 hours. Thyroid Function Tests No results for input(s): TSH,  T4TOTAL, T3FREE, THYROIDAB in the last 72 hours.  Invalid input(s): FREET3  Other results:   Imaging    No results found.   Medications:     Scheduled Medications:  aspirin EC  81 mg Oral Daily   atorvastatin  80 mg Oral QHS   buPROPion  150 mg Oral BID   dapagliflozin propanediol  10 mg Oral Daily   enoxaparin (LOVENOX) injection  40 mg Subcutaneous Q24H   insulin aspart  0-9 Units Subcutaneous TID WC   insulin glargine-yfgn  28 Units Subcutaneous Daily   metoprolol succinate  50 mg Oral Daily   polyethylene glycol  17 g Oral BID   sodium chloride flush  3 mL Intravenous Q12H   umeclidinium-vilanterol  1 puff Inhalation Daily    Infusions:  sodium chloride      PRN Medications: sodium chloride, acetaminophen, albuterol, ondansetron (ZOFRAN) IV, sodium chloride flush     Assessment/Plan   A/C Systolic Heart Failure, NICM  -Had LHC 2017  normal coronaries. Has Single [employer] Jude ICD.  -Echo EF 20-25% RV normal  -CXR with CHF. BNP 261.  -Device interrogation - suggestive fluid accumulation but back to normal with diuresis.  Suspect volume overload due to diet/fluid intake. Concern for possible low output.  - Needs GDMT but limited with elevated potassium.   - IV lasix discontinued yesterday. Appears slightly volume up today. ReDS 35%. Will give 40 mg furosemide po today. Scr up slightly from baseline.  - Toprol xl cut back to 50 mg daily d/t a/c CHF - Continue farxiga 10 mg daily  - Add entresto 24/26 mg BID - Will start spiro at clinic f/u if K remains stable.   Chronic Respiratory Failure - On 2-3 liters oxygen as needed. - Sats stable on room air.    DMII - Hgb A1C 9.4  - On SSI per primary team - On Farxiga   Tobacco Abuse - Discussed smoking cessation.   Okay for discharge from HF perspective.  CARDIAC MEDICATIONS: ASA 81 Atorvastatin 80 mg daily Furosemide 60 mg am and 40 mg pm Metoprolol xl 50 mg daily Entresto 24/26 mg BID Farxiga 10 mg daily  Has f/u in HF clinic on 09/23  Length of Stay: [place], [place], PA-C  06/20/2021, 9:28 AM  Advanced Heart Failure Team Pager [phone] (M-F; 7a - 5p)  Please contact [facility] for night-coverage after hours (5p -7a ) and weekends on [email or web address]   Patient seen and examined with the above-signed Advanced Practice Provider and/or Housestaff. I personally reviewed laboratory data, imaging studies and relevant notes. I independently examined the patient and formulated the important aspects of the plan. I have edited the note to reflect any of my changes or salient points. I have personally discussed the plan with the patient and/or family.  She is much improved today. Breathing better. Wants to go home ReDS 35%   General:  Well appearing. No resp difficulty HEENT: normal Neck: supple. no JVD. Carotids 2+ bilat; no bruits. No lymphadenopathy or  thryomegaly appreciated. Cor: PMI nondisplaced. Regular rate & rhythm. No rubs, gallops or murmurs. Lungs: clear Abdomen: soft, nontender, nondistended. No hepatosplenomegaly. No bruits or masses. Good bowel sounds. Extremities: no cyanosis, clubbing, rash, edema Neuro: alert & orientedx3, cranial nerves grossly intact. moves all 4 extremities w/o difficulty. Affect pleasant  Ok to d/c home from HF perspective. Will increase lasix to 60/40 and switch losartan to Entresto. D/w IMTS. Will see in HF Clinic next week. Enroll  in [facility] clinic with Sharman Cheek to follow fluid closely.   Glori Bickers, MD  11:50 AM

## 2021-06-18 NOTE — TOC Transition Note (Addendum)
Transition of Care [facility]) - CM/SW Discharge Note   Patient Details  Name: [name] MRN: [id number] Date of Birth: 01-26-1951  [facility] CM/SW Contact:  Zenon Mayo, RN Phone Number: 06/05/2021, 1:27 PM   Clinical Narrative:    Patient is for dc today, per pt eval she does not need any pt follow up. She will be starting on entresto, TOC pharmacy to fill first 30 days for her when they receive it from the MD.       Final next level of care: Home/Self Care Barriers to Discharge: No Barriers Identified   Patient Goals and CMS Choice Patient states their goals for this hospitalization and ongoing recovery are:: return home CMS Medicare.gov Compare Post Acute Care list provided to:: Patient Choice offered to / list presented to : NA  Discharge Placement                       Discharge Plan and Services   Discharge Planning Services: CM Consult Post Acute Care Choice: Home Health            DME Agency: NA       HH Arranged: NA          Social Determinants of Health (SDOH) Interventions Food Insecurity Interventions: Intervention Not Indicated (Pt receives food stamps) Financial Strain Interventions: Intervention Not Indicated Housing Interventions: Intervention Not Indicated Transportation Interventions: Intervention Not Indicated   Readmission Risk Interventions No flowsheet data found.

## 2021-06-18 NOTE — Progress Notes (Signed)
REDS VEST / Clip  READING=   35   Device FITTING TASKS: Clip Station = A CHEST RULER= [email or web address]. CPP, BCPS Clinical Pharmacist [phone] 06/22/2021 11:13 AM

## 2021-06-18 NOTE — Discharge Summary (Deleted)
Name: [name] MRN: [id number] DOB: [birth year] 70 y.o. PCP: Maudie Mercury, MD  Date of Admission: 06/16/2021  4:45 AM Date of Discharge: 06/21/2021 06/19/2021 Attending Physician: Dr. Daryll Drown  DISCHARGE DIAGNOSIS:  Primary Problem: Acute exacerbation of CHF (congestive heart failure) [facility])   Hospital Problems: Principal Problem:   Acute exacerbation of CHF (congestive heart failure) (HCC) Active Problems:   Tobacco use disorder   Essential hypertension   ICD (implantable cardioverter-defibrillator) in place   DMII (diabetes mellitus, type 2) ([place])   Hyperlipidemia   ILD (interstitial lung disease) ([place])    DISCHARGE MEDICATIONS:   Allergies as of 06/13/2021       Reactions   Actos [pioglitazone] Other (See Comments)   REACTION: congestive heart failure   Naproxen Other (See Comments)   569m dose made her sleep for two days   Rosiglitazone Hives   Tomato Itching   Hydrocodone-acetaminophen Itching   Hydrocortisone Hives, Itching, Other (See Comments)   unknown        Medication List     STOP taking these medications    clobetasol ointment 0.05 % Commonly known as: TEMOVATE   losartan 25 MG tablet Commonly known as: COZAAR   nitroGLYCERIN 0.4 MG SL tablet Commonly known as: NITROSTAT   spironolactone 25 MG tablet Commonly known as: ALDACTONE       TAKE these medications    Accu-Chek FastClix Lancet Kit Lancing device, use with lancets for blood glucose monitoring.   Accu-Chek Guide Me w/Device Kit by Does not apply route.   albuterol 108 (90 Base) MCG/ACT inhaler Commonly known as: VENTOLIN HFA Inhale 2 puffs into the lungs every 6 (six) hours as needed for wheezing or shortness of breath.   Anoro Ellipta 62.5-25 MCG/INH Aepb Generic drug: umeclidinium-vilanterol Inhale 1 puff into the lungs daily.   aspirin EC 81 MG tablet Take 1 tablet (81 mg total) by mouth 2 (two) times daily. What changed: when to take this   atorvastatin 80  MG tablet Commonly known as: LIPITOR Take 1 tablet (80 mg total) by mouth at bedtime. What changed: when to take this   buPROPion 150 MG 24 hr tablet Commonly known as: Wellbutrin XL Take 1 tablet (150 mg total) by mouth daily for 3 days, THEN 1 tablet (150 mg total) in the morning and at bedtime for 27 days. Start taking on: May 24, 2021   calcium-vitamin D 500-200 MG-UNIT tablet Commonly known as: OSCAL WITH D Take 1 tablet by mouth 2 (two) times daily.   dapagliflozin propanediol 10 MG Tabs tablet Commonly known as: Farxiga Take 1 tablet (10 mg total) by mouth daily.   Entresto 24-26 MG Generic drug: sacubitril-valsartan Take 1 tablet by mouth 2 (two) times daily.   furosemide 40 MG tablet Commonly known as: LASIX Take 1.5 tablets (60 mg total) by mouth in the morning AND 1 tablet (40 mg total) at bedtime. What changed: See the new instructions.   insulin lispro 100 UNIT/ML injection Commonly known as: HUMALOG Inject 0.06 mLs (6 Units total) into the skin 2 (two) times daily with a meal. What changed: when to take this   Lancets Misc 1 Act by Does not apply route 4 (four) times daily.   metoprolol succinate 50 MG 24 hr tablet Commonly known as: TOPROL-XL Take 1 tablet (50 mg total) by mouth daily. Take with or immediately following a meal. Start taking on: June 19, 2021 What changed:  how much to take additional instructions   Pen  Needles 32G X 4 MM Misc Use to inject insulin 3 times times per day   Tresiba FlexTouch 100 UNIT/ML FlexTouch Pen Generic drug: insulin degludec Inject 28 Units into the skin daily. What changed: how much to take   Victoza 18 MG/3ML Sopn Generic drug: liraglutide Inject 1.1m daily into the skin What changed:  how much to take how to take this when to take this        DISPOSITION AND FOLLOW-UP:  [facility] At the hospital follow up visit  please address:  Acute CHF Exacerbation HFrEF (EF 25%) s/p ICD Hypertension Medication changes include: metoprolol 50 mg daily, Lasix 60 mg in the morning and 40 mg in the evening, and Entresto 24-26 mg twice daily. Cardiology will consider adding spironolactone at follow-up on 09/22 at 12 p.m.  CKD Stage II Follow-up BMP for resolution of AKI.  Type 2 diabetes mellitus Hyperlipidemia Aortic atherosclerosis Chronically managed.    The patient will also need follow up for: Pulmonology: She had PFTs on 10/26/2018 that demonstrated a moderate to severe diffusion defect, moderate restrictive disease concerning for a parenchymal process, and moderate obstructive airway disease (FEV1 65% of predicted / FVC 63 / ratio 79 / DLCO 55). She had a follow up high resolution CT chest on 11/14/2018 that showed mosaic attenuation throughout both lungs with prominent air trapping indicative of small airway disease with superimposed pulmonary edema. She has had multiple pulmonology referrals but has not been seen.  Follow-up Recommendations: Consults: Cardiology, Pulmonology Labs: Basic Metabolic Profile Studies: None Medications: Decrease metoprolol to 50 mg daily. Start Entresto 24-26 mg twice daily. Do not take spironolactone. Change furosemide dosing to 60 mg in the morning and 40 mg in the evening, every day.  Follow-up Appointments:  Follow-up Information     [facility] Follow up on 06/25/2021.   Specialty: Cardiology Why: at 2:00 Contact information: [id number]       WMaudie Mercury MD. Go on 06/24/2021.   Specialty: Internal Medicine Why: @1 :15pm Contact information: [id number]:  Patient Summary: Acute CHF Exacerbation HFrEF (EF 25%) s/p ICD Patient presented to ED on 9/14  with signs and symptoms of acute decompensated heart failure including worsening of her baseline shortness of breath, likely secondary to increased fluid consumption and dietary choices.  Chest x-ray showed evidence of pulmonary edema and widening vascular pedicle.  Labs showed normal renal function, mild elevation AST with normal ALT and total bilirubin.  BNP was elevated to 261, previously 155. CBC was within normal limits. High-sensitivity troponin on arrival was mildly elevated but peaked at 76, and decreased to 67 on repeat.  EKG demonstrated normal sinus rhythm with prolonged QT interval and evidence of left atrial enlargement. She was given Lasix 40 mg IV with 800 cc UOP.  Echocardiogram showed left ventricular ejection fraction 20 to 25% with severely decreased left ventricular function as well as global hypokinesis.  This is consistent with grade 2 diastolic dysfunction.  Also noted elevated pulmonary artery systolic pressures.  Left atrium was severely dilated, mitral valve degenerative changes were noted, aortic valve was noted to have calcifications.  There was aortic dilation. AST did normalize 09/15.  On 9/15 diuretics were held  and metoprolol dose was decreased to 50 mg.  Device interrogation was suggestive of fluid accumulation but this did resolve with diuresis.  Spironolactone and Entresto were held as well due to potassium level of 5.  She remained on 2 to 3 L [place] as needed.  On day of discharge, she was not requiring oxygen supplementation.  Cardiology evaluated the patient and made medication adjustments as necessary  Type 2 diabetes mellitus Managed during admission with Farxiga 10 mg daily, novoLOG SSI, Semglee 28 units daily.  CKD Stage II On ED presentation, patient had AKI on CKD stage II with GFR of 56, creatinine of 1.08 up from baseline of 0.9 and GFR of greater than 60.  BUN/CR on day of discharge was 24/1.16 with GFR 52.  Hypertension Hyperlipidemia Aortic  atherosclerosis Chronically managed.  Patient was normotensive throughout admission, and losartan 25 mg daily as well as spironolactone 12.5 mg were held due to hyperkalemia concerns.  The decision to decrease metoprolol was also made during her stay. Cardiology did make medication changes and she will now take metoprolol 50 mg daily, Lasix 60 mg in the morning and 40 mg in the evening, and Entresto 24-26 mg twice daily on discharge.   Tobacco use disorder Managed with wellbutrin.   DISCHARGE INSTRUCTIONS:   Discharge Instructions     (HEART FAILURE PATIENTS) Call MD:  Anytime you have any of the following symptoms: 1) 3 pound weight gain in 24 hours or 5 pounds in 1 week 2) shortness of breath, with or without a dry hacking cough 3) swelling in the hands, feet or stomach 4) if you have to sleep on extra pillows at night in order to breathe.   Complete by: As directed    Amb Referral to Cardiac Rehabilitation   Complete by: As directed    Diagnosis: Heart Failure (see criteria below if ordering Phase II)   Heart Failure Type: Chronic Systolic   After initial evaluation and assessments completed: Virtual Based Care may be provided alone or in conjunction with Phase 2 Cardiac Rehab based on patient barriers.: Yes   Diet - low sodium heart healthy   Complete by: As directed    Increase activity slowly   Complete by: As directed        SUBJECTIVE:  Patient evaluated at bedside on day of discharge. She reports continuing to feel better as compared to when she was admitted. She has no concerns at this time.   Discharge Vitals:   BP 136/74 (BP Location: Left Arm)   Pulse 63   Temp (!) 97.3 F (36.3 C) (Oral)   Resp 17   Ht 5' 2"  (1.575 m)   Wt 89.9 kg   SpO2 100%   BMI 36.25 kg/m   OBJECTIVE:  Constitutional: Patient is sitting in bedside recliner, talking on the phone. No acute distress noted. Cardio: Regular rate and rhythm. S3 noted. Normal radial, PT pulses bilaterally. Pulm:  Bilateral basilar crackles noted. Normal work of breathing. She is on room air. Abdomen: Soft, non-distended, non-tender. MSK: Bilateral 1+ pitting edema. Extremities are cool to touch. Skin: Skin is cool and dry. Neuro:Alert and oriented x 3. No focal deficit noted. Psych:Normal mood and affect.   Pertinent Labs, Studies, and Procedures:  CBC Latest Ref Rng & Units 06/16/2021 06/16/2021 02/21/2020  WBC 4.0 - 10.5 K/uL 9.2 10.5 8.8  Hemoglobin 12.0 - 15.0 g/dL 12.8 12.6 11.8(L)  Hematocrit 36.0 - 46.0 % 41.9 41.3 38.4  Platelets 150 - 400 K/uL  180 258 198    CMP Latest Ref Rng & Units 06/25/2021 06/17/2021 06/16/2021  Glucose 70 - 99 mg/dL 161(H) 111(H) 333(H)  BUN 8 - 23 mg/dL 24(H) 23 15  Creatinine 0.44 - 1.00 mg/dL 1.16(H) 1.07(H) 1.08(H)  Sodium 135 - 145 mmol/L 140 142 137  Potassium 3.5 - 5.1 mmol/L 4.5 5.0 4.3  Chloride 98 - 111 mmol/L 96(L) 103 102  CO2 22 - 32 mmol/L 25 30 26   Calcium 8.9 - 10.3 mg/dL 9.1 9.1 9.4  Total Protein 6.5 - 8.1 g/dL - 6.4(L) 6.8  Total Bilirubin 0.3 - 1.2 mg/dL - 1.0 0.7  Alkaline Phos 38 - 126 U/L - 150(H) 152(H)  AST 15 - 41 U/L - 36 64(H)  ALT 0 - 44 U/L - 35 43    DG Chest Port 1 View  Result Date: 06/16/2021 CLINICAL DATA:  Shortness of breath for 1 day EXAM: PORTABLE CHEST 1 VIEW COMPARISON:  07/14/2020 FINDINGS: Cardiopericardial enlargement and vascular pedicle widening. Defibrillator lead into the right ventricle. Hazy bilateral lower chest attributed to pleural fluid with diffuse interstitial opacity. No pneumothorax. IMPRESSION: CHF. Electronically Signed   By: Jorje Guild M.D.   On: 06/16/2021 05:22   ECHOCARDIOGRAM COMPLETE  Result Date: 06/16/2021    ECHOCARDIOGRAM REPORT   Patient Name:   BONITA BRINDISI Date of Exam: 06/16/2021 Medical Rec #:  [id number]    Height:       62.0 in Accession #:    [id number]   Weight:       205.0 lb Date of Birth:  1951/08/12   BSA:          1.932 m Patient Age:    13 years     BP:           130/102  mmHg Patient Gender: F            HR:           71 bpm. Exam Location:  Inpatient Procedure: 2D Echo, 3D Echo, Color Doppler and Cardiac Doppler STAT ECHO Indications:    I50.9* Heart failure (unspecified)  History:        Patient has prior history of Echocardiogram examinations, most                 recent 02/21/2020. Defibrillator; Risk Factors:Hypertension,                 Diabetes and Dyslipidemia.  Sonographer:    Raquel Sarna Senior RDCS Referring Phys: [phone] [place]  1. Left ventricular ejection fraction, by estimation, is 20 to 25%. Left ventricular ejection fraction by PLAX is 23 %. The left ventricle has severely decreased function. The left ventricle demonstrates global hypokinesis. The left ventricular internal  cavity size was mildly dilated. Left ventricular diastolic parameters are consistent with Grade II diastolic dysfunction (pseudonormalization). Elevated left ventricular end-diastolic pressure.  2. Right ventricular systolic function is normal. The right ventricular size is normal. There is severely elevated pulmonary artery systolic pressure.  3. Left atrial size was severely dilated.  4. The mitral valve is degenerative. Trivial mitral valve regurgitation. No evidence of mitral stenosis.  5. The aortic valve is tricuspid. There is mild calcification of the aortic valve. There is mild thickening of the aortic valve. Aortic valve regurgitation is mild. No aortic stenosis is present. Aortic regurgitation PHT measures 430 msec.  6. Aortic dilatation noted. There is mild dilatation of the ascending aorta, measuring 37 mm.  7. The inferior  vena cava is dilated in size with <50% respiratory variability, suggesting right atrial pressure of 15 mmHg. FINDINGS  Left Ventricle: Left ventricular ejection fraction, by estimation, is 20 to 25%. Left ventricular ejection fraction by PLAX is 23 %. The left ventricle has severely decreased function. The left ventricle demonstrates global  hypokinesis. The left ventricular internal cavity size was mildly dilated. There is no left ventricular hypertrophy. Left ventricular diastolic parameters are consistent with Grade II diastolic dysfunction (pseudonormalization). Elevated left ventricular end-diastolic pressure. Right Ventricle: The right ventricular size is normal. No increase in right ventricular wall thickness. Right ventricular systolic function is normal. There is severely elevated pulmonary artery systolic pressure. The tricuspid regurgitant velocity is 3.62 m/s, and with an assumed right atrial pressure of 15 mmHg, the estimated right ventricular systolic pressure is 40.3 mmHg. Left Atrium: Left atrial size was severely dilated. Right Atrium: Right atrial size was normal in size. Pericardium: There is no evidence of pericardial effusion. Mitral Valve: The mitral valve is degenerative in appearance. Mild mitral annular calcification. Trivial mitral valve regurgitation. No evidence of mitral valve stenosis. Tricuspid Valve: The tricuspid valve is normal in structure. Tricuspid valve regurgitation is mild . No evidence of tricuspid stenosis. Aortic Valve: The aortic valve is tricuspid. There is mild calcification of the aortic valve. There is mild thickening of the aortic valve. Aortic valve regurgitation is mild. Aortic regurgitation PHT measures 430 msec. No aortic stenosis is present. Pulmonic Valve: The pulmonic valve was normal in structure. Pulmonic valve regurgitation is trivial. No evidence of pulmonic stenosis. Aorta: Aortic dilatation noted. There is mild dilatation of the ascending aorta, measuring 37 mm. Venous: The inferior vena cava is dilated in size with less than 50% respiratory variability, suggesting right atrial pressure of 15 mmHg. IAS/Shunts: No atrial level shunt detected by color flow Doppler. Additional Comments: A device lead is visualized.  LEFT VENTRICLE PLAX 2D LV EF:         Left            Diastology                 ventricular     LV e' medial:    4.79 cm/s                ejection        LV E/e' medial:  21.7                fraction by     LV e' lateral:   6.20 cm/s                PLAX is 23      LV E/e' lateral: 16.8                %. LVIDd:         5.70 cm LVIDs:         5.10 cm LV PW:         1.00 cm LV IVS:        0.70 cm         3D Volume EF: LVOT diam:     1.90 cm         3D EF:        26 % LV SV:         41              LV EDV:       186 ml LV SV Index:   21  LV ESV:       137 ml LVOT Area:     2.84 cm        LV SV:        49 ml  RIGHT VENTRICLE RV S prime:     8.16 cm/s TAPSE (M-mode): 2.0 cm LEFT ATRIUM             Index       RIGHT ATRIUM           Index LA diam:        4.60 cm 2.38 cm/m  RA Area:     14.60 cm LA Vol (A2C):   97.0 ml 50.21 ml/m RA Volume:   36.20 ml  18.74 ml/m LA Vol (A4C):   71.6 ml 37.06 ml/m LA Biplane Vol: 83.7 ml 43.33 ml/m  AORTIC VALVE LVOT Vmax:         72.65 cm/s LVOT Vmean:        49.550 cm/s LVOT VTI:          0.144 m AI PHT:            430 msec AR Vena Contracta: 0.20 cm  AORTA Ao Root diam: 2.50 cm Ao Asc diam:  3.70 cm MITRAL VALVE                 TRICUSPID VALVE MV Area (PHT): 4.96 cm      TR Peak grad:   52.4 mmHg MV Decel Time: 153 msec      TR Vmax:        362.00 cm/s MR Peak grad:    113.2 mmHg MR Mean grad:    78.0 mmHg   SHUNTS MR Vmax:         532.00 cm/s Systemic VTI:  0.14 m MR Vmean:        422.0 cm/s  Systemic Diam: 1.90 cm MR PISA:         1.01 cm MR PISA Eff ROA: 6 mm MR PISA Radius:  0.40 cm MV E velocity: 104.00 cm/s MV A velocity: 52.10 cm/s MV E/A ratio:  2.00 Skeet Latch MD Electronically signed by Skeet Latch MD Signature Date/Time: 06/16/2021/1:25:28 PM    Final      Signed: Farrel Gordon, D.O.  Internal Medicine Resident, PGY-1 [facility]  Pager: [phone] 8:00 PM, 06/06/2021    Date: 06/23/2021  Patient name: ADAJA WANDER  Medical record number: [id number]  Date of birth: 09-12-1951         I have seen and evaluated this patient and I have discussed the plan of care with the house staff. Please see Dr Randel Pigg above note for complete details. I concur with her findings and plan for discharge.   Sid Falcon, MD 06/23/2021, 8:01 PM

## 2021-06-18 NOTE — Discharge Summary (Signed)
Name: [name] MRN: [id number] DOB: 08-16-1951 70 y.o. PCP: Maudie Mercury, MD  Date of Admission: 06/16/2021  4:45 AM Date of Discharge: 06/27/2021 07/01/2021 Attending Physician: Dr. Daryll Drown  DISCHARGE DIAGNOSIS:  Primary Problem: Acute exacerbation of CHF (congestive heart failure) [facility])   Hospital Problems: Principal Problem:   Acute exacerbation of CHF (congestive heart failure) (HCC) Active Problems:   Tobacco use disorder   Essential hypertension   ICD (implantable cardioverter-defibrillator) in place   DMII (diabetes mellitus, type 2) ([place])   Hyperlipidemia   ILD (interstitial lung disease) ([place])    DISCHARGE MEDICATIONS:   Allergies as of 07/02/2021       Reactions   Actos [pioglitazone] Other (See Comments)   REACTION: congestive heart failure   Naproxen Other (See Comments)   552m dose made her sleep for two days   Rosiglitazone Hives   Tomato Itching   Hydrocodone-acetaminophen Itching   Hydrocortisone Hives, Itching, Other (See Comments)   unknown        Medication List     STOP taking these medications    clobetasol ointment 0.05 % Commonly known as: TEMOVATE   losartan 25 MG tablet Commonly known as: COZAAR   nitroGLYCERIN 0.4 MG SL tablet Commonly known as: NITROSTAT   spironolactone 25 MG tablet Commonly known as: ALDACTONE       TAKE these medications    Accu-Chek FastClix Lancet Kit Lancing device, use with lancets for blood glucose monitoring.   Accu-Chek Guide Me w/Device Kit by Does not apply route.   albuterol 108 (90 Base) MCG/ACT inhaler Commonly known as: VENTOLIN HFA Inhale 2 puffs into the lungs every 6 (six) hours as needed for wheezing or shortness of breath.   Anoro Ellipta 62.5-25 MCG/INH Aepb Generic drug: umeclidinium-vilanterol Inhale 1 puff into the lungs daily.   aspirin EC 81 MG tablet Take 1 tablet (81 mg total) by mouth 2 (two) times daily. What changed: when to take this   atorvastatin 80  MG tablet Commonly known as: LIPITOR Take 1 tablet (80 mg total) by mouth at bedtime. What changed: when to take this   buPROPion 150 MG 24 hr tablet Commonly known as: Wellbutrin XL Take 1 tablet (150 mg total) by mouth daily for 3 days, THEN 1 tablet (150 mg total) in the morning and at bedtime for 27 days. Start taking on: May 24, 2021   calcium-vitamin D 500-200 MG-UNIT tablet Commonly known as: OSCAL WITH D Take 1 tablet by mouth 2 (two) times daily.   dapagliflozin propanediol 10 MG Tabs tablet Commonly known as: Farxiga Take 1 tablet (10 mg total) by mouth daily.   Entresto 24-26 MG Generic drug: sacubitril-valsartan Take 1 tablet by mouth 2 (two) times daily.   furosemide 40 MG tablet Commonly known as: LASIX Take 1.5 tablets (60 mg total) by mouth in the morning AND 1 tablet (40 mg total) at bedtime. What changed: See the new instructions.   insulin lispro 100 UNIT/ML injection Commonly known as: HUMALOG Inject 0.06 mLs (6 Units total) into the skin 2 (two) times daily with a meal. What changed: when to take this   Lancets Misc 1 Act by Does not apply route 4 (four) times daily.   metoprolol succinate 50 MG 24 hr tablet Commonly known as: TOPROL-XL Take 1 tablet (50 mg total) by mouth daily. Take with or immediately following a meal. Start taking on: June 19, 2021 What changed:  how much to take additional instructions   Pen  Needles 32G X 4 MM Misc Use to inject insulin 3 times times per day   Tresiba FlexTouch 100 UNIT/ML FlexTouch Pen Generic drug: insulin degludec Inject 28 Units into the skin daily. What changed: how much to take   Victoza 18 MG/3ML Sopn Generic drug: liraglutide Inject 1.71m daily into the skin What changed:  how much to take how to take this when to take this        DISPOSITION AND FOLLOW-UP:  [name] At the hospital follow up visit  please address:  Acute CHF Exacerbation HFrEF (EF 25%) s/p ICD Hypertension Medication changes include: metoprolol 50 mg daily, Lasix 60 mg in the morning and 40 mg in the evening, and Entresto 24-26 mg twice daily. Cardiology will consider adding spironolactone at follow-up on 09/22 at 12 p.m.  CKD Stage II Follow-up BMP for resolution of AKI.  Type 2 diabetes mellitus Hyperlipidemia Aortic atherosclerosis Chronically managed.    The patient will also need follow up for: Pulmonology: She had PFTs on 10/26/2018 that demonstrated a moderate to severe diffusion defect, moderate restrictive disease concerning for a parenchymal process, and moderate obstructive airway disease (FEV1 65% of predicted / FVC 63 / ratio 79 / DLCO 55). She had a follow up high resolution CT chest on 11/14/2018 that showed mosaic attenuation throughout both lungs with prominent air trapping indicative of small airway disease with superimposed pulmonary edema. She has had multiple pulmonology referrals but has not been seen.  Follow-up Recommendations: Consults: Cardiology, Pulmonology Labs: Basic Metabolic Profile Studies: None Medications: Decrease metoprolol to 50 mg daily. Start Entresto 24-26 mg twice daily. Do not take spironolactone. Change furosemide dosing to 60 mg in the morning and 40 mg in the evening, every day.  Follow-up Appointments:  Follow-up Information     [facility] Follow up on 06/25/2021.   Specialty: Cardiology Why: at 2:00 Contact information: [phone]       WMaudie Mercury MD. Go on 06/24/2021.   Specialty: Internal Medicine Why: @1 :15pm Contact information: [id number]:  Patient Summary: Acute CHF Exacerbation HFrEF (EF 25%) s/p ICD Patient presented to ED on 9/14  with signs and symptoms of acute decompensated heart failure including worsening of her baseline shortness of breath, likely secondary to increased fluid consumption and dietary choices.  Chest x-ray showed evidence of pulmonary edema and widening vascular pedicle.  Labs showed normal renal function, mild elevation AST with normal ALT and total bilirubin.  BNP was elevated to 261, previously 155. CBC was within normal limits. High-sensitivity troponin on arrival was mildly elevated but peaked at 76, and decreased to 67 on repeat.  EKG demonstrated normal sinus rhythm with prolonged QT interval and evidence of left atrial enlargement. She was given Lasix 40 mg IV with 800 cc UOP.  Echocardiogram showed left ventricular ejection fraction 20 to 25% with severely decreased left ventricular function as well as global hypokinesis.  This is consistent with grade 2 diastolic dysfunction.  Also noted elevated pulmonary artery systolic pressures.  Left atrium was severely dilated, mitral valve degenerative changes were noted, aortic valve was noted to have calcifications.  There was aortic dilation. AST did normalize 09/15.  On 9/15 diuretics were held  and metoprolol dose was decreased to 50 mg.  Device interrogation was suggestive of fluid accumulation but this did resolve with diuresis.  Spironolactone and Entresto were held as well due to potassium level of 5.  She remained on 2 to 3 L [place] as needed.  On day of discharge, she was not requiring oxygen supplementation.  Cardiology evaluated the patient and made medication adjustments as necessary  Type 2 diabetes mellitus Managed during admission with Farxiga 10 mg daily, novoLOG SSI, Semglee 28 units daily.  CKD Stage II On ED presentation, patient had AKI on CKD stage II with GFR of 56, creatinine of 1.08 up from baseline of 0.9 and GFR of greater than 60.  BUN/CR on day of discharge was 24/1.16 with GFR 52.  Hypertension Hyperlipidemia Aortic  atherosclerosis Chronically managed.  Patient was normotensive throughout admission, and losartan 25 mg daily as well as spironolactone 12.5 mg were held due to hyperkalemia concerns.  The decision to decrease metoprolol was also made during her stay. Cardiology did make medication changes and she will now take metoprolol 50 mg daily, Lasix 60 mg in the morning and 40 mg in the evening, and Entresto 24-26 mg twice daily on discharge.   Tobacco use disorder Managed with wellbutrin.   DISCHARGE INSTRUCTIONS:   Discharge Instructions     (HEART FAILURE PATIENTS) Call MD:  Anytime you have any of the following symptoms: 1) 3 pound weight gain in 24 hours or 5 pounds in 1 week 2) shortness of breath, with or without a dry hacking cough 3) swelling in the hands, feet or stomach 4) if you have to sleep on extra pillows at night in order to breathe.   Complete by: As directed    Amb Referral to Cardiac Rehabilitation   Complete by: As directed    Diagnosis: Heart Failure (see criteria below if ordering Phase II)   Heart Failure Type: Chronic Systolic   After initial evaluation and assessments completed: Virtual Based Care may be provided alone or in conjunction with Phase 2 Cardiac Rehab based on patient barriers.: Yes   Diet - low sodium heart healthy   Complete by: As directed    Increase activity slowly   Complete by: As directed        SUBJECTIVE:  Patient evaluated at bedside on day of discharge. She reports continuing to feel better as compared to when she was admitted. She has no concerns at this time.   Discharge Vitals:   BP 136/74 (BP Location: Left Arm)   Pulse 63   Temp (!) 97.3 F (36.3 C) (Oral)   Resp 17   Ht 5' 2"  (1.575 m)   Wt 89.9 kg   SpO2 100%   BMI 36.25 kg/m   OBJECTIVE:  Constitutional: Patient is sitting in bedside recliner, talking on the phone. No acute distress noted. Cardio: Regular rate and rhythm. S3 noted. Normal radial, PT pulses bilaterally. Pulm:  Bilateral basilar crackles noted. Normal work of breathing. She is on room air. Abdomen: Soft, non-distended, non-tender. MSK: Bilateral 1+ pitting edema. Extremities are cool to touch. Skin: Skin is cool and dry. Neuro:Alert and oriented x 3. No focal deficit noted. Psych:Normal mood and affect.   Pertinent Labs, Studies, and Procedures:  CBC Latest Ref Rng & Units 06/16/2021 06/16/2021 02/21/2020  WBC 4.0 - 10.5 K/uL 9.2 10.5 8.8  Hemoglobin 12.0 - 15.0 g/dL 12.8 12.6 11.8(L)  Hematocrit 36.0 - 46.0 % 41.9 41.3 38.4  Platelets 150 - 400 K/uL  180 258 198    CMP Latest Ref Rng & Units 06/27/2021 06/17/2021 06/16/2021  Glucose 70 - 99 mg/dL 161(H) 111(H) 333(H)  BUN 8 - 23 mg/dL 24(H) 23 15  Creatinine 0.44 - 1.00 mg/dL 1.16(H) 1.07(H) 1.08(H)  Sodium 135 - 145 mmol/L 140 142 137  Potassium 3.5 - 5.1 mmol/L 4.5 5.0 4.3  Chloride 98 - 111 mmol/L 96(L) 103 102  CO2 22 - 32 mmol/L 25 30 26   Calcium 8.9 - 10.3 mg/dL 9.1 9.1 9.4  Total Protein 6.5 - 8.1 g/dL - 6.4(L) 6.8  Total Bilirubin 0.3 - 1.2 mg/dL - 1.0 0.7  Alkaline Phos 38 - 126 U/L - 150(H) 152(H)  AST 15 - 41 U/L - 36 64(H)  ALT 0 - 44 U/L - 35 43    DG Chest Port 1 View  Result Date: 06/16/2021 CLINICAL DATA:  Shortness of breath for 1 day EXAM: PORTABLE CHEST 1 VIEW COMPARISON:  07/14/2020 FINDINGS: Cardiopericardial enlargement and vascular pedicle widening. Defibrillator lead into the right ventricle. Hazy bilateral lower chest attributed to pleural fluid with diffuse interstitial opacity. No pneumothorax. IMPRESSION: CHF. Electronically Signed   By: Jorje Guild M.D.   On: 06/16/2021 05:22   ECHOCARDIOGRAM COMPLETE  Result Date: 06/16/2021    ECHOCARDIOGRAM REPORT   Patient Name:   [name] Date of Exam: 06/16/2021 Medical Rec #:  [id number]    Height:       62.0 in Accession #:    [id number]   Weight:       205.0 lb Date of Birth:  [birth year]   BSA:          1.932 m Patient Age:    85 years     BP:           130/102  mmHg Patient Gender: F            HR:           71 bpm. Exam Location:  Inpatient Procedure: 2D Echo, 3D Echo, Color Doppler and Cardiac Doppler STAT ECHO Indications:    I50.9* Heart failure (unspecified)  History:        Patient has prior history of Echocardiogram examinations, most                 recent 02/21/2020. Defibrillator; Risk Factors:Hypertension,                 Diabetes and Dyslipidemia.  Sonographer:    Raquel Sarna Senior RDCS Referring Phys: [phone] [place]  1. Left ventricular ejection fraction, by estimation, is 20 to 25%. Left ventricular ejection fraction by PLAX is 23 %. The left ventricle has severely decreased function. The left ventricle demonstrates global hypokinesis. The left ventricular internal  cavity size was mildly dilated. Left ventricular diastolic parameters are consistent with Grade II diastolic dysfunction (pseudonormalization). Elevated left ventricular end-diastolic pressure.  2. Right ventricular systolic function is normal. The right ventricular size is normal. There is severely elevated pulmonary artery systolic pressure.  3. Left atrial size was severely dilated.  4. The mitral valve is degenerative. Trivial mitral valve regurgitation. No evidence of mitral stenosis.  5. The aortic valve is tricuspid. There is mild calcification of the aortic valve. There is mild thickening of the aortic valve. Aortic valve regurgitation is mild. No aortic stenosis is present. Aortic regurgitation PHT measures 430 msec.  6. Aortic dilatation noted. There is mild dilatation of the ascending aorta, measuring 37 mm.  7. The inferior  vena cava is dilated in size with <50% respiratory variability, suggesting right atrial pressure of 15 mmHg. FINDINGS  Left Ventricle: Left ventricular ejection fraction, by estimation, is 20 to 25%. Left ventricular ejection fraction by PLAX is 23 %. The left ventricle has severely decreased function. The left ventricle demonstrates global  hypokinesis. The left ventricular internal cavity size was mildly dilated. There is no left ventricular hypertrophy. Left ventricular diastolic parameters are consistent with Grade II diastolic dysfunction (pseudonormalization). Elevated left ventricular end-diastolic pressure. Right Ventricle: The right ventricular size is normal. No increase in right ventricular wall thickness. Right ventricular systolic function is normal. There is severely elevated pulmonary artery systolic pressure. The tricuspid regurgitant velocity is 3.62 m/s, and with an assumed right atrial pressure of 15 mmHg, the estimated right ventricular systolic pressure is 63.8 mmHg. Left Atrium: Left atrial size was severely dilated. Right Atrium: Right atrial size was normal in size. Pericardium: There is no evidence of pericardial effusion. Mitral Valve: The mitral valve is degenerative in appearance. Mild mitral annular calcification. Trivial mitral valve regurgitation. No evidence of mitral valve stenosis. Tricuspid Valve: The tricuspid valve is normal in structure. Tricuspid valve regurgitation is mild . No evidence of tricuspid stenosis. Aortic Valve: The aortic valve is tricuspid. There is mild calcification of the aortic valve. There is mild thickening of the aortic valve. Aortic valve regurgitation is mild. Aortic regurgitation PHT measures 430 msec. No aortic stenosis is present. Pulmonic Valve: The pulmonic valve was normal in structure. Pulmonic valve regurgitation is trivial. No evidence of pulmonic stenosis. Aorta: Aortic dilatation noted. There is mild dilatation of the ascending aorta, measuring 37 mm. Venous: The inferior vena cava is dilated in size with less than 50% respiratory variability, suggesting right atrial pressure of 15 mmHg. IAS/Shunts: No atrial level shunt detected by color flow Doppler. Additional Comments: A device lead is visualized.  LEFT VENTRICLE PLAX 2D LV EF:         Left            Diastology                 ventricular     LV e' medial:    4.79 cm/s                ejection        LV E/e' medial:  21.7                fraction by     LV e' lateral:   6.20 cm/s                PLAX is 23      LV E/e' lateral: 16.8                %. LVIDd:         5.70 cm LVIDs:         5.10 cm LV PW:         1.00 cm LV IVS:        0.70 cm         3D Volume EF: LVOT diam:     1.90 cm         3D EF:        26 % LV SV:         41              LV EDV:       186 ml LV SV Index:   21  LV ESV:       137 ml LVOT Area:     2.84 cm        LV SV:        49 ml  RIGHT VENTRICLE RV S prime:     8.16 cm/s TAPSE (M-mode): 2.0 cm LEFT ATRIUM             Index       RIGHT ATRIUM           Index LA diam:        4.60 cm 2.38 cm/m  RA Area:     14.60 cm LA Vol (A2C):   97.0 ml 50.21 ml/m RA Volume:   36.20 ml  18.74 ml/m LA Vol (A4C):   71.6 ml 37.06 ml/m LA Biplane Vol: 83.7 ml 43.33 ml/m  AORTIC VALVE LVOT Vmax:         72.65 cm/s LVOT Vmean:        49.550 cm/s LVOT VTI:          0.144 m AI PHT:            430 msec AR Vena Contracta: 0.20 cm  AORTA Ao Root diam: 2.50 cm Ao Asc diam:  3.70 cm MITRAL VALVE                 TRICUSPID VALVE MV Area (PHT): 4.96 cm      TR Peak grad:   52.4 mmHg MV Decel Time: 153 msec      TR Vmax:        362.00 cm/s MR Peak grad:    113.2 mmHg MR Mean grad:    78.0 mmHg   SHUNTS MR Vmax:         532.00 cm/s Systemic VTI:  0.14 m MR Vmean:        422.0 cm/s  Systemic Diam: 1.90 cm MR PISA:         1.01 cm MR PISA Eff ROA: 6 mm MR PISA Radius:  0.40 cm MV E velocity: 104.00 cm/s MV A velocity: 52.10 cm/s MV E/A ratio:  2.00 Skeet Latch MD Electronically signed by Skeet Latch MD Signature Date/Time: 06/16/2021/1:25:28 PM    Final      Signed: Farrel Gordon, D.O.  Internal Medicine Resident, PGY-1 [facility]  Pager: [phone] 8:02 PM, 06/17/2021    Date: 06/10/2021  Patient name: [name]  Medical record number: [id number]  Date of birth: [birth year]         I have seen and evaluated this patient and I have discussed the plan of care with the house staff. Please see Dr. Randel Pigg note for complete details. I concur with her findings and plan for discharge.  Sid Falcon, MD 06/23/2021, 8:03 PM

## 2021-06-18 NOTE — Hospital Course (Addendum)
Acute CHF Exacerbation HFrEF (EF 25%) s/p ICD Patient presented to ED on 9/14 with signs and symptoms of acute decompensated heart failure including worsening of her baseline shortness of breath, likely secondary to increased fluid consumption and dietary choices.  Chest x-ray showed evidence of pulmonary edema and widening vascular pedicle.  Labs showed normal renal function, mild elevation AST with normal ALT and total bilirubin.  BNP was elevated to 261, previously 155. CBC was within normal limits. High-sensitivity troponin on arrival was mildly elevated but peaked at 76, and decreased to 67 on repeat.  EKG demonstrated normal sinus rhythm with prolonged QT interval and evidence of left atrial enlargement. She was given Lasix 40 mg IV with 800 cc UOP.  Echocardiogram showed left ventricular ejection fraction 20 to 25% with severely decreased left ventricular function as well as global hypokinesis.  This is consistent with grade 2 diastolic dysfunction.  Also noted elevated pulmonary artery systolic pressures.  Left atrium was severely dilated, mitral valve degenerative changes were noted, aortic valve was noted to have calcifications.  There was aortic dilation. AST did normalize 09/15.  On 9/15 diuretics were held and metoprolol dose was decreased to 50 mg.  Device interrogation was suggestive of fluid accumulation but this did resolve with diuresis.  Spironolactone and Entresto were held as well due to potassium level of 5.  She remained on 2 to 3 L  as needed.  On day of discharge, she was not requiring oxygen supplementation.  Cardiology evaluated the patient and made medication adjustments as necessary  Type 2 diabetes mellitus Managed during admission with Farxiga 10 mg daily, novoLOG SSI, Semglee 28 units daily.  CKD Stage II On ED presentation, patient had AKI on CKD stage II with GFR of 56, creatinine of 1.08 up from baseline of 0.9 and GFR of greater than 60.  BUN/CR on day of discharge was  24/1.16 with GFR 52.  Hypertension Hyperlipidemia Aortic atherosclerosis Chronically managed.  Patient was normotensive throughout admission, and losartan 25 mg daily as well as spironolactone 12.5 mg were held due to hyperkalemia concerns.  The decision to decrease metoprolol was also made during her stay. Cardiology did make medication changes and she will now take metoprolol 50 mg daily, Lasix 60 mg in the morning and 40 mg in the evening, and Entresto 24-26 mg twice daily on discharge.   Tobacco use disorder Managed with wellbutrin.

## 2021-06-18 NOTE — Progress Notes (Signed)
CARDIAC REHAB PHASE I   PRE:  Rate/Rhythm: 60 SR    BP: sitting 116/71    SaO2: 100 2L, 92 RA  MODE:  Ambulation: 320 ft   POST:  Rate/Rhythm: 71 SR    BP: sitting 125/65     SaO2: 96 RA  Pt moved out of bed and walked hall independently. Some fatigue with distance. No O2 needed (sts she only wears at night at home). VSS.  Discussed HF management using booklet. Discussed diet and exercise. She is interested in [place] and will refer to [place]. She has cut back smoking and is planning on quitting completely. Gave resources. Voiced understanding. [place], ACSM 06/21/2021 9:43 AM

## 2021-06-23 ENCOUNTER — Other Ambulatory Visit: Payer: Self-pay

## 2021-06-23 DIAGNOSIS — F17200 Nicotine dependence, unspecified, uncomplicated: Secondary | ICD-10-CM

## 2021-06-24 ENCOUNTER — Encounter: Payer: Self-pay | Admitting: Internal Medicine

## 2021-06-24 ENCOUNTER — Encounter (HOSPITAL_COMMUNITY): Payer: Medicare Other

## 2021-06-24 ENCOUNTER — Ambulatory Visit (INDEPENDENT_AMBULATORY_CARE_PROVIDER_SITE_OTHER): Payer: Medicare Other | Admitting: Internal Medicine

## 2021-06-24 ENCOUNTER — Telehealth (HOSPITAL_COMMUNITY): Payer: Self-pay

## 2021-06-24 VITALS — BP 136/80 | HR 65 | Temp 98.1°F | Wt 200.5 lb

## 2021-06-24 DIAGNOSIS — I5042 Chronic combined systolic (congestive) and diastolic (congestive) heart failure: Secondary | ICD-10-CM | POA: Diagnosis not present

## 2021-06-24 DIAGNOSIS — E1142 Type 2 diabetes mellitus with diabetic polyneuropathy: Secondary | ICD-10-CM | POA: Diagnosis not present

## 2021-06-24 DIAGNOSIS — J849 Interstitial pulmonary disease, unspecified: Secondary | ICD-10-CM

## 2021-06-24 DIAGNOSIS — E1169 Type 2 diabetes mellitus with other specified complication: Secondary | ICD-10-CM

## 2021-06-24 DIAGNOSIS — I10 Essential (primary) hypertension: Secondary | ICD-10-CM

## 2021-06-24 DIAGNOSIS — E785 Hyperlipidemia, unspecified: Secondary | ICD-10-CM | POA: Diagnosis not present

## 2021-06-24 DIAGNOSIS — Z23 Encounter for immunization: Secondary | ICD-10-CM | POA: Diagnosis not present

## 2021-06-24 MED ORDER — DAPAGLIFLOZIN PROPANEDIOL 10 MG PO TABS: 10.0000 mg | ORAL_TABLET | Freq: Every day | ORAL | 2 refills | Status: DC

## 2021-06-24 MED ORDER — SACUBITRIL-VALSARTAN 24-26 MG PO TABS: 1.0000 | ORAL_TABLET | Freq: Two times a day (BID) | ORAL | 3 refills | Status: DC

## 2021-06-24 MED ORDER — METOPROLOL SUCCINATE ER 50 MG PO TB24: ORAL_TABLET | ORAL | 3 refills | Status: DC

## 2021-06-24 MED ORDER — ATORVASTATIN CALCIUM 80 MG PO TABS: 80.0000 mg | ORAL_TABLET | Freq: Every day | ORAL | 3 refills | Status: DC

## 2021-06-24 NOTE — Patient Instructions (Addendum)
To Ms. Salsbury,   It was a pleasure meeting you today! Today we discussed your recent hospital stay. I am glad you are doing better since you have been out of the hospital. Today I will refill your medications from your recent stay.   Additionally you do have a follow up appointment at the Cardiologist located at [address] [place], [place] [address] at 2:00 p.m. Please make sure to continue taking your Entresto two times daily (morning and evening), Furosemide 60 mg in the morning and 40 mg in the evening, and your metoprolol 50 mg daily.   We will also have you see a pulmonologist to evaluate your lungs. I will place a referral today.   Lastly, I am going to collect some blood today to see how your kidnies are doing.   We will have you follow back with [place] next month to make sure your blood pressures continue to stay normal. Have a good day.  Maudie Mercury, MD

## 2021-06-24 NOTE — Telephone Encounter (Signed)
Pt insurance is active and benefits verified through [facility] Medicare Co-pay 0, DED 0/0 met, out of pocket $7,550/$0 met, co-insurance 0%. no pre-authorization required. [employer]port, 06/24/2021@11 [place] [place], REF# [id number]   Pt insurance is active through Medicaid. Ref# [id number]   Will fax over [facility] Reimbursement form to Dr. Haroldine Laws     Will contact patient to see if she is interested in the Cardiac Rehab Program. If interested, patient will need to complete follow up appt. Once completed, patient will be contacted for scheduling upon review by the RN Navigator.

## 2021-06-24 NOTE — Telephone Encounter (Signed)
Called patient to see if she is interested in the Cardiac Rehab Program. Patient expressed interest. Explained scheduling process and went over insurance, patient verbalized understanding. Will contact patient for scheduling once f/u has been completed. 

## 2021-06-24 NOTE — Progress Notes (Signed)
ADVANCED HF CLINIC  NOTE   Primary Care: Maudie Mercury, MD HF Cardiologist: Dr. Haroldine Laws  HPI: [name] is a 70 y.o. with history of DMII, COPD, chronic respiratory failure on home oxygen, DMII, HTN, St Jude ICD, tobacco abuse, NICM, and chronic systolic heart failure.    Presented to [facility] 9/22 with increased shortness of breath. Admitted for A/C CHF. Started on IV lasix. Had ECHO that showed EF 20-25% and normal RV. AHF consulted for management, some concern that she may have low output. R/LHC deferred as symptoms improved with diuresis. GDMT was limited by elevated potassium. Cardiac rehab consulted. Losartan and spiro held at discharge.    Today he returns for post hospitalization HF follow up. SOB with walking on flat ground, does not lift or walk up stairs. Wears 2-3 L oxygen at night. Denies CP, dizziness, edema, or PND/Orthopnea. Appetite ok. Does not consistently weigh at home. Taking all medications.  Just started taking Entresto twice daily, thought it was once daily. Lives by herself, friend gives her a ride. Smoking 1/2 ppd. Plans on starting wellbutrin soon to try and quit. Drinks 1-2 wine coolers on Saturdays.   Cardiac Studies  - Echo 9/22 EF 20-25% RV normal , LA severely reduced,  - Echo 2018 EF 25-30% Grade IDD - LHC 2017 Normal Coronaries.   Review of Systems: [y] = yes, _0  = no   General: Weight gain _1 ; Weight loss _2 ; Anorexia _3 ; Fatigue _4 ; Fever _5 ; Chills _6 ; Weakness _7   Cardiac: Chest pain/pressure _8 ; Resting SOB _9 ; Exertional SOB Blue.Reese ]; Orthopnea _10 ; Pedal Edema _11 ; Palpitations _12 ; Syncope _13 ; Presyncope _14 ; Paroxysmal nocturnal dyspnea_15   Pulmonary: Cough _16 ; Wheezing_17 ; Hemoptysis_18 ; Sputum _19 ; Snoring _20   GI: Vomiting_21 ; Dysphagia_22 ; Melena_23 ; Hematochezia _24 ; Heartburn_25 ; Abdominal pain _26 ; Constipation _27 ; Diarrhea _28 ; BRBPR _29   GU: Hematuria_30 ; Dysuria _31 ; Nocturia_32   Vascular: Pain in legs with walking Blue.Reese ]; Pain in  feet with lying flat _33 ; Non-healing sores _34 ; Stroke _35 ; TIA _36 ; Slurred speech _37 ;  Neuro: Headaches_38 ; Vertigo_39 ; Seizures_40 ; Paresthesias_41 ;Blurred vision _42 ; Diplopia _43 ; Vision changes _44   Ortho/Skin: Arthritis _45 ; Joint pain _46 ; Muscle pain _47 ; Joint swelling _48 ; Back Pain _49 ; Rash _50   Psych: Depression_51 ; Anxiety_52   Heme: Bleeding problems _53 ; Clotting disorders _54 ; Anemia _55   Endocrine: Diabetes [y]; Thyroid dysfunction_56   Past Medical History:  Diagnosis Date   ATN (acute tubular necrosis) ([place]) 07/15/2014   Automatic implantable cardioverter-defibrillator in situ    Automatic implantable cardioverter-defibrillator in situ 05/04/2007   Qualifier: Diagnosis of  By: Isla Pence     Cardiac defibrillator in situ    Atlas II VR (SJM) implanted by Dr Lovena Le   CHF (congestive heart failure) ([place])    Chronic combined systolic and diastolic heart failure ([place])    a. EF 35-40% in past;  b. Echo 7/13:  EF 45-50%, Gr 2 diast dysfn, mild AI, mild MAC, trivial MR, mild LAE, PASP 47.   Chronic ulcer of leg ([place])    04-09-15 resolved-not a problem.   Colon polyps 04/12/2013   Rectosigmoid polyp   COPD (chronic obstructive pulmonary disease) (HCC)    O2 at night   Depression  Diabetes mellitus    Diabetes mellitus, type 2 ([place]) 04/03/2012   HIGH RISK FEET.. Please have patient take shoes and socks off every visit for visual foot inspection.     Eczema    Elevated alkaline phosphatase level    GGT and 5'nucleotidase 8/13 normal   Health care maintenance 01/22/2013   Surgically absent cervix- no pap needed (Path report 07/2000: uterine body with attached bilateral  adnexa and separate cervix.)   History of oxygen administration    oxygen @ 2 l/m nasally bedtime 24/7   HTN (hypertension)    Hx of cardiac cath    a. [place] 2003 normal;  b. LHC 6/13:  Mild calcification in the LM, o/w normal coronary arteries, EF 45%.    Hyperkalemia 08/08/2017   Hyperlipidemia     Elevated triglyceride in 2019   Hyperlipidemia associated with type 2 diabetes mellitus ([place]) 05/25/2007   Qualifier: Diagnosis of  By: Hassell Done FNP, Nykedtra     Hyperthyroidism, subclinical    HYPERTHYROIDISM, SUBCLINICAL 05/06/2009   Qualifier: Diagnosis of  By: Darrick Meigs     Implantable cardioverter-defibrillator (ICD) generator end of life    Lipoma    Need for hepatitis C screening test 04/25/2019   NICM (nonischemic cardiomyopathy) ([place])    Obesity    On home oxygen therapy    "2L; 24/7" (10/11/2016)   Post menopausal syndrome    Shortness of breath 07/14/2017   Skin rash 07/12/2018   Sleep apnea    pt denies 04/12/2013   Current Outpatient Medications  Medication Sig Dispense Refill   albuterol (VENTOLIN HFA) 108 (90 Base) MCG/ACT inhaler Inhale 2 puffs into the lungs every 6 (six) hours as needed for wheezing or shortness of breath. 8 g 2   aspirin EC 81 MG tablet Take 81 mg by mouth daily. Swallow whole.     atorvastatin (LIPITOR) 80 MG tablet Take 1 tablet (80 mg total) by mouth at bedtime. 90 tablet 3   Blood Glucose Monitoring Suppl (ACCU-CHEK GUIDE ME) w/Device KIT by Does not apply route.     buPROPion (WELLBUTRIN XL) 150 MG 24 hr tablet Take 150 mg by mouth daily.     calcium-vitamin D (OSCAL WITH D) 500-200 MG-UNIT tablet Take 1 tablet by mouth 2 (two) times daily. 90 tablet 6   dapagliflozin propanediol (FARXIGA) 10 MG TABS tablet Take 1 tablet (10 mg total) by mouth daily. 30 tablet 2   furosemide (LASIX) 40 MG tablet Take 1.5 tablets (60 mg total) by mouth in the morning AND 1 tablet (40 mg total) at bedtime. 75 tablet 1   insulin degludec (TRESIBA) 100 UNIT/ML FlexTouch Pen Inject 22 Units into the skin daily.     insulin lispro (HUMALOG) 100 UNIT/ML injection Inject 6 Units into the skin 3 (three) times daily with meals.     Insulin Pen Needle (PEN NEEDLES) 32G X 4 MM MISC Use to inject insulin 3 times times per day 100 each 3   Lancets Misc. (ACCU-CHEK FASTCLIX  LANCET) KIT Lancing device, use with lancets for blood glucose monitoring. 1 kit 0   Lancets MISC 1 Act by Does not apply route 4 (four) times daily. 200 each 3   liraglutide (VICTOZA) 18 MG/3ML SOPN Inject 1.65m daily into the skin 30 mL 3   metoprolol succinate (TOPROL-XL) 50 MG 24 hr tablet TAKE 1 TABLET(50 MG) BY MOUTH DAILY WITH OR IMMEDIATELY FOLLOWING A MEAL 90 tablet 3   sacubitril-valsartan (ENTRESTO) 24-26 MG Take 1 tablet  by mouth 2 (two) times daily. 60 tablet 3   umeclidinium-vilanterol (ANORO ELLIPTA) 62.5-25 MCG/INH AEPB Inhale 1 puff into the lungs daily. 1 each 3   No current facility-administered medications for this encounter.   Allergies  Allergen Reactions   Actos [Pioglitazone] Other (See Comments)    REACTION: congestive heart failure    Naproxen Other (See Comments)    521m dose made her sleep for two days   Rosiglitazone Hives   Tomato Itching   Hydrocodone-Acetaminophen Itching   Hydrocortisone Hives, Itching and Other (See Comments)    unknown   Social History   Socioeconomic History   Marital status: Significant Other    Spouse name: Not on file   Number of children: 3   Years of education: 11   Highest education level: 11th grade  Occupational History   Occupation: disabled  Tobacco Use   Smoking status: Every Day    Packs/day: 0.50    Years: 45.00    Pack years: 22.50    Types: Cigarettes   Smokeless tobacco: Never   Tobacco comments:    currently smoking 3-4 cigs per day  Vaping Use   Vaping Use: Never used  Substance and Sexual Activity   Alcohol use: Not Currently    Alcohol/week: 3.0 standard drinks    Types: 3 Shots of liquor per week   Drug use: No   Sexual activity: Yes    Partners: Male    Birth control/protection: Surgical  Other Topics Concern   Not on file  Social History Narrative   ** Merged History Encounter **       Married   Patient enjoys going to [employer] church and spending time with family   Social  Determinants of Health   Financial Resource Strain: Low Risk    Difficulty of Paying Living Expenses: Not very hard  Food Insecurity: No Food Insecurity   Worried About RCharity fundraiserin the Last Year: Never true   RArboriculturistin the Last Year: Never true  Transportation Needs: No Transportation Needs   Lack of Transportation (Medical): No   Lack of Transportation (Non-Medical): No  Physical Activity: Not on file  Stress: Not on file  Social Connections: Not on file  Intimate Partner Violence: Not on file   Family History  Problem Relation Age of Onset   Stroke Mother    Seizures Father    Heart disease Father    Diabetes Sister    Asthma Maternal Aunt        aunts   Asthma Maternal Uncle        uncles   Heart disease Paternal Aunt        aunts   Heart disease Paternal Uncle        uncles   Heart disease Maternal Aunt        aunts   Heart disease Maternal Uncle        uncles   Heart disease Maternal Grandfather    Breast cancer Daughter    Breast cancer Cousin    Asthma Grandchild    Colon cancer Neg Hx    Colon polyps Neg Hx    Esophageal cancer Neg Hx    Kidney disease Neg Hx    Gallbladder disease Neg Hx    BP 130/80   Pulse 84   Wt 89.7 kg   SpO2 93%   BMI 36.18 kg/m   Wt Readings from Last 3 Encounters:  06/25/21 89.7  kg  06/24/21 90.9 kg  06/05/2021 89.9 kg   ReDs: 33%  PHYSICAL EXAM: General:  NAD. No resp difficulty, arrived in [facility]. HEENT: Normal Neck: Supple. No JVD. Carotids 2+ bilat; no bruits. No lymphadenopathy or thryomegaly appreciated. Cor: PMI nondisplaced. Regular rate & rhythm. No rubs, gallops or murmurs. Lungs: Clear, diminished in bases. Abdomen: Obese,  nontender, nondistended. No hepatosplenomegaly. No bruits or masses. Good bowel sounds. Extremities: No cyanosis, clubbing, rash, trace LE edema Neuro: Alert & oriented x 3, cranial nerves grossly intact. Moves all 4 extremities w/o difficulty. Affect pleasant.  ECG: SR 83  bpm qtc 484 Peggy (personally reviewed).  Device Interrogation: no VT/VF, stable thoracic impedence (personally reviewed).  ASSESSMENT & PLAN:  Chronic Systolic Heart Failure, NICM  - Had LHC 2017 normal coronaries. Has Single [employer] Jude ICD.  - Echo (9/22): EF 20-25% RV normal  - NYHA II-early III. Volume good today, ReDs 33%. - Start spiro 12.5 mg daily. - Decrease lasix to 40 mg bid (previously on 60/40). - Continue Toprol XL 50 mg daily. - Continue Farxiga 10 mg daily . - Continue Entresto 24/25 mg bid. - Labs yesterday ok. BMET in 1 week. Follow K closely. - Will ask device RN to enroll in monthly fluid monitoring. - We discussed importance of daily weights and salt and fluid limitations.   Chronic Respiratory Failure - On 2-3 liters oxygen as needed. - 93% on RA today. - Has been referred to [facility] for ILD.   DMII - Hgb A1C 9.4  - On Farxiga. No GU symptoms.   Tobacco Abuse - Discussed smoking cessation.  - She plans on starting Wellbutrin soon.   Follow up with PharmD in 3 weeks (will need BMET) and APP in 6 weeks for further medication titration. Consider increasing spiro or Entresto if K allows.  Allena Katz, FNP-BC  06/25/21

## 2021-06-25 ENCOUNTER — Encounter (HOSPITAL_COMMUNITY): Payer: Self-pay

## 2021-06-25 ENCOUNTER — Ambulatory Visit (HOSPITAL_COMMUNITY)
Admit: 2021-06-25 | Discharge: 2021-06-25 | Disposition: A | Discharge status: Home / Self Care | Payer: Medicare Other | Source: Ambulatory Visit | Attending: Family Medicine | Admitting: Family Medicine

## 2021-06-25 ENCOUNTER — Encounter (HOSPITAL_COMMUNITY): Payer: Medicare Other

## 2021-06-25 ENCOUNTER — Other Ambulatory Visit: Payer: Self-pay

## 2021-06-25 ENCOUNTER — Encounter: Payer: Self-pay | Admitting: Internal Medicine

## 2021-06-25 VITALS — BP 130/80 | HR 84 | Wt 197.8 lb

## 2021-06-25 DIAGNOSIS — Z9981 Dependence on supplemental oxygen: Secondary | ICD-10-CM | POA: Insufficient documentation

## 2021-06-25 DIAGNOSIS — Z7982 Long term (current) use of aspirin: Secondary | ICD-10-CM | POA: Diagnosis not present

## 2021-06-25 DIAGNOSIS — Z720 Tobacco use: Secondary | ICD-10-CM

## 2021-06-25 DIAGNOSIS — Z7901 Long term (current) use of anticoagulants: Secondary | ICD-10-CM | POA: Insufficient documentation

## 2021-06-25 DIAGNOSIS — Z79899 Other long term (current) drug therapy: Secondary | ICD-10-CM | POA: Diagnosis not present

## 2021-06-25 DIAGNOSIS — I110 Hypertensive heart disease with heart failure: Secondary | ICD-10-CM | POA: Insufficient documentation

## 2021-06-25 DIAGNOSIS — I428 Other cardiomyopathies: Secondary | ICD-10-CM | POA: Diagnosis not present

## 2021-06-25 DIAGNOSIS — I5042 Chronic combined systolic (congestive) and diastolic (congestive) heart failure: Secondary | ICD-10-CM | POA: Insufficient documentation

## 2021-06-25 DIAGNOSIS — J449 Chronic obstructive pulmonary disease, unspecified: Secondary | ICD-10-CM | POA: Insufficient documentation

## 2021-06-25 DIAGNOSIS — F17210 Nicotine dependence, cigarettes, uncomplicated: Secondary | ICD-10-CM | POA: Diagnosis not present

## 2021-06-25 DIAGNOSIS — J9610 Chronic respiratory failure, unspecified whether with hypoxia or hypercapnia: Secondary | ICD-10-CM | POA: Diagnosis not present

## 2021-06-25 DIAGNOSIS — Z8249 Family history of ischemic heart disease and other diseases of the circulatory system: Secondary | ICD-10-CM | POA: Diagnosis not present

## 2021-06-25 DIAGNOSIS — Z794 Long term (current) use of insulin: Secondary | ICD-10-CM

## 2021-06-25 DIAGNOSIS — J849 Interstitial pulmonary disease, unspecified: Secondary | ICD-10-CM | POA: Insufficient documentation

## 2021-06-25 DIAGNOSIS — E119 Type 2 diabetes mellitus without complications: Secondary | ICD-10-CM | POA: Insufficient documentation

## 2021-06-25 LAB — BMP8+ANION GAP
Anion Gap: 19 mmol/L — ABNORMAL HIGH (ref 10.0–18.0)
BUN/Creatinine Ratio: 20 (ref 12–28)
BUN: 23 mg/dL (ref 8–27)
CO2: 26 mmol/L (ref 20–29)
Calcium: 9.7 mg/dL (ref 8.7–10.3)
Chloride: 98 mmol/L (ref 96–106)
Creatinine, Ser: 1.13 mg/dL — ABNORMAL HIGH (ref 0.57–1.00)
Glucose: 256 mg/dL — ABNORMAL HIGH (ref 65–99)
Potassium: 4.2 mmol/L (ref 3.5–5.2)
Sodium: 143 mmol/L (ref 134–144)
eGFR: 53 mL/min/{1.73_m2} — ABNORMAL LOW (ref >59)

## 2021-06-25 MED ORDER — SPIRONOLACTONE 25 MG PO TABS: 12.5000 mg | ORAL_TABLET | Freq: Every day | ORAL | 3 refills | Status: DC

## 2021-06-25 MED ORDER — FUROSEMIDE 40 MG PO TABS: 40.0000 mg | ORAL_TABLET | Freq: Two times a day (BID) | ORAL | 3 refills | Status: DC

## 2021-06-25 NOTE — Progress Notes (Signed)
ReDS Vest / Clip - 06/25/21 1400       ReDS Vest / Clip   Station Marker A    Ruler Value 30.5    ReDS Value Range Low volume    ReDS Actual Value 33

## 2021-06-25 NOTE — Patient Instructions (Addendum)
EKG done today.  No Labs done today.   START Spironolactone 12.5mg  (1/2 tablet) by mouth daily.   DECREASE Lasix to 40mg  (1 tablets) by mouth 2 times daily.   No other medication changes were made. Please continue all current medications as prescribed.  [facility] [employer]: [phone]  Your physician recommends that you schedule a follow-up appointment in: 1 week for a lab only appointment, 3 weeks with our Clinic Pharmacist, 6 weeks with our APP Clinic and in 3-4 months with Dr. Haroldine Laws. (All appointments here in our office)  If you have any questions or concerns before your next appointment please send [place] a message through [place] or call our office at [phone].    TO LEAVE A MESSAGE FOR THE NURSE SELECT OPTION 2, PLEASE LEAVE A MESSAGE INCLUDING: YOUR NAME DATE OF BIRTH CALL BACK NUMBER REASON FOR CALL**this is important as we prioritize the call backs  YOU WILL RECEIVE A CALL BACK THE SAME DAY AS LONG AS YOU CALL BEFORE 4:00 PM   Do the following things EVERYDAY: Weigh yourself in the morning before breakfast. Write it down and keep it in a log. Take your medicines as prescribed Eat low salt foods--Limit salt (sodium) to 2000 mg per day.  Stay as active as you can everyday Limit all fluids for the day to less than 2 liters   At the [facility], you and your health needs are our priority. As part of our continuing mission to provide you with exceptional heart care, we have created designated Provider Care Teams. These Care Teams include your primary Cardiologist (physician) and Advanced Practice Providers (APPs- Physician Assistants and Nurse Practitioners) who all work together to provide you with the care you need, when you need it.   You may see any of the following providers on your designated Care Team at your next follow up: Dr Glori Bickers Dr Haynes Kerns, NP Lyda Jester, [place] Audry Riles, PharmD   Please be sure to  bring in all your medications bottles to every appointment.

## 2021-06-25 NOTE — Assessment & Plan Note (Addendum)
Patient presents for hospital follow-up for acute on chronic exacerbation recommended following diastolic ingestive heart failure.  Patient states she is doing very well since last time she was seen in the hospital.  She has noticed that the swelling in her legs has stayed down since the hospital stay.  She brings her medications with her today, and she is able to confirm the dose that she is taking.  She does say that she thought the [facility] 24-26 was meant to be once daily, but she started taking it twice daily last night. - Continue Lasix 60 mg in the morning and 40 mg in the evening - Continue Entresto 24-26 mg twice daily - Continue metoprolol 50 mg daily - Continue farxiga 10 mg tablets daily - Follow-up with cardiology appointment, and cardiac rehab - Refills provided for the above medications

## 2021-06-25 NOTE — Assessment & Plan Note (Signed)
Patient with a diagnosis of interstitial lung disease, presents for hospital follow-up.  He has been noted that she has had several referrals for pulmonology for further evaluation of her ILD, the patient has never made an appointment.  Today we discussed the importance of following up with pulmonology for further evaluation of her medical condition.  Patient is in agreement. - Referral to pulmonology for ILD

## 2021-06-25 NOTE — Progress Notes (Signed)
CC: Hospital Follow Up   HPI:  [name] is a 70 y.o. person, with a PMH noted below, who presents to the clinic for a Hospital Follow Up. To see the management of their acute and chronic conditions, please see the A&P note under the Encounters tab.   Past Medical History:  Diagnosis Date   ATN (acute tubular necrosis) ([place]) 07/15/2014   Automatic implantable cardioverter-defibrillator in situ    Automatic implantable cardioverter-defibrillator in situ 05/04/2007   Qualifier: Diagnosis of  By: Isla Pence     Cardiac defibrillator in situ    Atlas II VR (SJM) implanted by Dr Lovena Le   CHF (congestive heart failure) ([place])    Chronic combined systolic and diastolic heart failure ([place])    a. EF 35-40% in past;  b. Echo 7/13:  EF 45-50%, Gr 2 diast dysfn, mild AI, mild MAC, trivial MR, mild LAE, PASP 47.   Chronic ulcer of leg ([place])    04-09-15 resolved-not a problem.   Colon polyps 04/12/2013   Rectosigmoid polyp   COPD (chronic obstructive pulmonary disease) (HCC)    O2 at night   Depression    Diabetes mellitus    Diabetes mellitus, type 2 ([place]) 04/03/2012   HIGH RISK FEET.. Please have patient take shoes and socks off every visit for visual foot inspection.     Eczema    Elevated alkaline phosphatase level    GGT and 5'nucleotidase 8/13 normal   Health care maintenance 01/22/2013   Surgically absent cervix- no pap needed (Path report 07/2000: uterine body with attached bilateral  adnexa and separate cervix.)   History of oxygen administration    oxygen @ 2 l/m nasally bedtime 24/7   HTN (hypertension)    Hx of cardiac cath    a. [place] 2003 normal;  b. LHC 6/13:  Mild calcification in the LM, o/w normal coronary arteries, EF 45%.    Hyperkalemia 08/08/2017   Hyperlipidemia    Elevated triglyceride in 2019   Hyperlipidemia associated with type 2 diabetes mellitus ([place]) 05/25/2007   Qualifier: Diagnosis of  By: Hassell Done FNP, Nykedtra     Hyperthyroidism, subclinical     HYPERTHYROIDISM, SUBCLINICAL 05/06/2009   Qualifier: Diagnosis of  By: Darrick Meigs     Implantable cardioverter-defibrillator (ICD) generator end of life    Lipoma    Need for hepatitis C screening test 04/25/2019   NICM (nonischemic cardiomyopathy) ([place])    Obesity    On home oxygen therapy    "2L; 24/7" (10/11/2016)   Post menopausal syndrome    Shortness of breath 07/14/2017   Skin rash 07/12/2018   Sleep apnea    pt denies 04/12/2013   Review of Systems:   Review of Systems  Constitutional:  Negative for chills, diaphoresis, fever, malaise/fatigue and weight loss.  Cardiovascular:  Negative for chest pain, palpitations, orthopnea, claudication and leg swelling.  Gastrointestinal:  Negative for abdominal pain, constipation, diarrhea, nausea and vomiting.    Physical Exam:  Vitals:   06/24/21 1312 06/24/21 1330  BP: (!) 142/78 136/80  Pulse: 66 65  Temp: 98.1 F (36.7 C)   TempSrc: Oral   SpO2: 99%   Weight: 200 lb 8 oz (90.9 kg)    Physical Exam Vitals reviewed.  Constitutional:      General: She is not in acute distress.    Appearance: Normal appearance. She is obese. She is not ill-appearing, toxic-appearing or diaphoretic.     Comments: Sitting comfortably in wheelchair, answering  questions appropriately, no acute distress  HENT:     Head: Normocephalic and atraumatic.  Cardiovascular:     Rate and Rhythm: Normal rate and regular rhythm.     Pulses: Normal pulses.     Heart sounds: Normal heart sounds. No murmur heard.   No friction rub. No gallop.  Pulmonary:     Effort: Pulmonary effort is normal. No respiratory distress.     Breath sounds: Normal breath sounds. No wheezing, rhonchi or rales.  Abdominal:     General: Bowel sounds are normal.     Tenderness: There is no abdominal tenderness. There is no guarding.  Musculoskeletal:     Right lower leg: Edema present.     Left lower leg: Edema present.     Comments: 1+ pitting edema bilaterally in the lower  extremities  Neurological:     Mental Status: She is alert and oriented to person, place, and time.  Psychiatric:        Mood and Affect: Mood normal.        Behavior: Behavior normal.     Assessment & Plan:   See Encounters Tab for problem based charting.  Patient discussed with Dr. Heber [place]

## 2021-06-25 NOTE — Progress Notes (Signed)
Internal Medicine Clinic Attending ? ?Case discussed with Dr. Winters  At the time of the visit.  We reviewed the resident?s history and exam and pertinent patient test results.  I agree with the assessment, diagnosis, and plan of care documented in the resident?s note.  ?

## 2021-06-28 ENCOUNTER — Telehealth (HOSPITAL_COMMUNITY): Payer: Self-pay | Admitting: *Deleted

## 2021-06-28 NOTE — Telephone Encounter (Signed)
-----   Message from Rafael Bihari, [place] sent at 06/28/2021  1:15 PM EDT ----- Regarding: RE: Ok to monitor O2 Saturation and apply oxygen if needed Hi Danilo Cappiello,  Thank you for reaching out. O2 sat of 88% or better is where I would like to keep her. Definitely low cut off can be 88%, and yes, please use 2-3 liters of oxygen if she is not wearing any as needed.  Thank you, -Janett Billow ----- Message ----- From: Rowe Pavy, RN Sent: 06/28/2021  12:21 PM EDT To: Rafael Bihari, FNP Subject: Ok to monitor O2 Saturation and apply oxygen#   Janett Billow,  The above pt competed her post hospitalization follow up with you on 9/23.  In anticipation that exertion will result in increased oxygen demand, seeking guidance on how best to proceed.  Pt uses oxygen 2-3 liters as needed. We will monitor her o2 saturation resting and on exertion.  What is an acceptable o2 saturation for this pt on exertion?   May we apply oxygen if not already wearing oxygen to maintain this saturation goal?  Typically for our pulmonary rehab patients the low cut off point is 88%.  Thanks so much for your valued input!    Cherre Huger, BSN Cardiac and Training and development officer

## 2021-06-29 DIAGNOSIS — J449 Chronic obstructive pulmonary disease, unspecified: Secondary | ICD-10-CM | POA: Diagnosis not present

## 2021-06-29 DIAGNOSIS — I509 Heart failure, unspecified: Secondary | ICD-10-CM | POA: Diagnosis not present

## 2021-07-02 ENCOUNTER — Other Ambulatory Visit: Payer: Self-pay

## 2021-07-02 ENCOUNTER — Ambulatory Visit (HOSPITAL_COMMUNITY)
Admission: RE | Admit: 2021-07-02 | Discharge: 2021-07-02 | Disposition: A | Discharge status: Home / Self Care | Payer: Medicare Other | Source: Ambulatory Visit | Attending: Cardiology | Admitting: Cardiology

## 2021-07-02 DIAGNOSIS — I5042 Chronic combined systolic (congestive) and diastolic (congestive) heart failure: Secondary | ICD-10-CM | POA: Diagnosis not present

## 2021-07-02 LAB — BASIC METABOLIC PANEL
Anion gap: 7 (ref 5–15)
BUN: 20 mg/dL (ref 8–23)
CO2: 29 mmol/L (ref 22–32)
Calcium: 9.6 mg/dL (ref 8.9–10.3)
Chloride: 104 mmol/L (ref 98–111)
Creatinine, Ser: 1.04 mg/dL — ABNORMAL HIGH (ref 0.44–1.00)
GFR, Estimated: 59 mL/min — ABNORMAL LOW (ref >60)
Glucose, Bld: 259 mg/dL — ABNORMAL HIGH (ref 70–99)
Potassium: 4.5 mmol/L (ref 3.5–5.1)
Sodium: 140 mmol/L (ref 135–145)

## 2021-07-03 NOTE — Death Summary Note (Deleted)
Name: CARSON MECHE MRN: [id number] DOB: [birth year] 70 y.o. PCP: Maudie Mercury, MD  Date of Admission: 06/16/2021  4:45 AM Date of Discharge:  06/04/2021 Attending Physician: Dr. Daryll Drown  DISCHARGE DIAGNOSIS:  Primary Problem: Acute exacerbation of CHF (congestive heart failure) [facility])   Hospital Problems: Principal Problem:   Acute exacerbation of CHF (congestive heart failure) (HCC) Active Problems:   Tobacco use disorder   Essential hypertension   ICD (implantable cardioverter-defibrillator) in place   DMII (diabetes mellitus, type 2) ([place])   Hyperlipidemia   ILD (interstitial lung disease) ([place])    DISCHARGE MEDICATIONS:   Allergies as of 06/03/2021       Reactions   Actos [pioglitazone] Other (See Comments)   REACTION: congestive heart failure   Naproxen Other (See Comments)   54m dose made her sleep for two days   Rosiglitazone Hives   Tomato Itching   Hydrocodone-acetaminophen Itching   Hydrocortisone Hives, Itching, Other (See Comments)   unknown        Medication List     STOP taking these medications    clobetasol ointment 0.05 % Commonly known as: TEMOVATE   losartan 25 MG tablet Commonly known as: COZAAR   nitroGLYCERIN 0.4 MG SL tablet Commonly known as: NITROSTAT   spironolactone 25 MG tablet Commonly known as: ALDACTONE       TAKE these medications    Accu-Chek FastClix Lancet Kit Lancing device, use with lancets for blood glucose monitoring.   Accu-Chek Guide Me w/Device Kit by Does not apply route.   albuterol 108 (90 Base) MCG/ACT inhaler Commonly known as: VENTOLIN HFA Inhale 2 puffs into the lungs every 6 (six) hours as needed for wheezing or shortness of breath.   Anoro Ellipta 62.5-25 MCG/INH Aepb Generic drug: umeclidinium-vilanterol Inhale 1 puff into the lungs daily.   aspirin EC 81 MG tablet Take 1 tablet (81 mg total) by mouth 2 (two) times daily. What changed: when to take this   atorvastatin 80 MG  tablet Commonly known as: LIPITOR Take 1 tablet (80 mg total) by mouth at bedtime. What changed: when to take this   buPROPion 150 MG 24 hr tablet Commonly known as: Wellbutrin XL Take 1 tablet (150 mg total) by mouth daily for 3 days, THEN 1 tablet (150 mg total) in the morning and at bedtime for 27 days. Start taking on: May 24, 2021   calcium-vitamin D 500-200 MG-UNIT tablet Commonly known as: OSCAL WITH D Take 1 tablet by mouth 2 (two) times daily.   dapagliflozin propanediol 10 MG Tabs tablet Commonly known as: Farxiga Take 1 tablet (10 mg total) by mouth daily.   furosemide 40 MG tablet Commonly known as: LASIX Take 1.5 tablets (60 mg total) by mouth in the morning AND 1 tablet (40 mg total) at bedtime. What changed: See the new instructions.   insulin lispro 100 UNIT/ML injection Commonly known as: HUMALOG Inject 0.06 mLs (6 Units total) into the skin 2 (two) times daily with a meal. What changed: when to take this   Lancets Misc 1 Act by Does not apply route 4 (four) times daily.   metoprolol succinate 50 MG 24 hr tablet Commonly known as: TOPROL-XL Take 1 tablet (50 mg total) by mouth daily. Take with or immediately following a meal. Start taking on: June 19, 2021 What changed:  how much to take additional instructions   Pen Needles 32G X 4 MM Misc Use to inject insulin 3 times times per day  sacubitril-valsartan 24-26 MG Commonly known as: ENTRESTO Take 1 tablet by mouth 2 (two) times daily.   Tyler Aas FlexTouch 100 UNIT/ML FlexTouch Pen Generic drug: insulin degludec Inject 28 Units into the skin daily. What changed: how much to take   Victoza 18 MG/3ML Sopn Generic drug: liraglutide Inject 1.3m daily into the skin What changed:  how much to take how to take this when to take this        DISPOSITION AND FOLLOW-UP:  [facility] At the hospital follow up visit  please address:  Acute CHF Exacerbation HFrEF (EF 25%) s/p ICD Hypertension Medication changes include: metoprolol 50 mg daily, Lasix 60 mg in the morning and 40 mg in the evening, and Entresto 24-26 mg twice daily. Cardiology will consider adding spironolactone at follow-up on 09/22 at 12 p.m.  CKD Stage II Follow-up BMP for resolution of AKI.  Type 2 diabetes mellitus Hyperlipidemia Aortic atherosclerosis Chronically managed.    The patient will also need follow up for: Pulmonology: She had PFTs on 10/26/2018 that demonstrated a moderate to severe diffusion defect, moderate restrictive disease concerning for a parenchymal process, and moderate obstructive airway disease (FEV1 65% of predicted / FVC 63 / ratio 79 / DLCO 55). She had a follow up high resolution CT chest on 11/14/2018 that showed mosaic attenuation throughout both lungs with prominent air trapping indicative of small airway disease with superimposed pulmonary edema. She has had multiple pulmonology referrals but has not been seen.  Follow-up Recommendations: Consults: Cardiology, Pulmonology Labs: Basic Metabolic Profile Studies: None Medications: Decrease metoprolol to 50 mg daily. Start Entresto 24-26 mg twice daily. Do not take spironolactone. Change furosemide dosing to 60 mg in the morning and 40 mg in the evening, every day.  Follow-up Appointments:  Follow-up Information     [facility] Follow up on 06/25/2021.   Specialty: Cardiology Why: at 2:00 Contact information: [address][id number]       WMaudie Mercury MD. Go on 06/24/2021.   Specialty: Internal Medicine Why: @1 :15pm Contact information: [id number]:  Patient Summary: Acute CHF Exacerbation HFrEF (EF 25%) s/p ICD Patient presented to ED on 9/14  with signs and symptoms of acute decompensated heart failure including worsening of her baseline shortness of breath, likely secondary to increased fluid consumption and dietary choices.  Chest x-ray showed evidence of pulmonary edema and widening vascular pedicle.  Labs showed normal renal function, mild elevation AST with normal ALT and total bilirubin.  BNP was elevated to 261, previously 155. CBC was within normal limits. High-sensitivity troponin on arrival was mildly elevated but peaked at 76, and decreased to 67 on repeat.  EKG demonstrated normal sinus rhythm with prolonged QT interval and evidence of left atrial enlargement. She was given Lasix 40 mg IV with 800 cc UOP.  Echocardiogram showed left ventricular ejection fraction 20 to 25% with severely decreased left ventricular function as well as global hypokinesis.  This is consistent with grade 2 diastolic dysfunction.  Also noted elevated pulmonary artery systolic pressures.  Left atrium was severely dilated, mitral valve degenerative changes were noted, aortic valve was noted to have calcifications.  There was aortic dilation. AST did normalize 09/15.  On 9/15 diuretics were  held and metoprolol dose was decreased to 50 mg.  Device interrogation was suggestive of fluid accumulation but this did resolve with diuresis.  Spironolactone and Entresto were held as well due to potassium level of 5.  She remained on 2 to 3 L  as needed.  On day of discharge, she was not requiring oxygen supplementation.  Cardiology evaluated the patient and made medication adjustments as necessary  Type 2 diabetes mellitus Managed during admission with Farxiga 10 mg daily, novoLOG SSI, Semglee 28 units daily.  CKD Stage II On ED presentation, patient had AKI on CKD stage II with GFR of 56, creatinine of 1.08 up from baseline of 0.9 and GFR of greater than 60.  BUN/CR on day of discharge was 24/1.16 with GFR 52.  Hypertension Hyperlipidemia Aortic  atherosclerosis Chronically managed.  Patient was normotensive throughout admission, and losartan 25 mg daily as well as spironolactone 12.5 mg were held due to hyperkalemia concerns.  The decision to decrease metoprolol was also made during her stay. Cardiology did make medication changes and she will now take metoprolol 50 mg daily, Lasix 60 mg in the morning and 40 mg in the evening, and Entresto 24-26 mg twice daily on discharge.   Tobacco use disorder Managed with wellbutrin.   DISCHARGE INSTRUCTIONS:   Discharge Instructions     (HEART FAILURE PATIENTS) Call MD:  Anytime you have any of the following symptoms: 1) 3 pound weight gain in 24 hours or 5 pounds in 1 week 2) shortness of breath, with or without a dry hacking cough 3) swelling in the hands, feet or stomach 4) if you have to sleep on extra pillows at night in order to breathe.   Complete by: As directed    Amb Referral to Cardiac Rehabilitation   Complete by: As directed    Diagnosis: Heart Failure (see criteria below if ordering Phase II)   Heart Failure Type: Chronic Systolic   After initial evaluation and assessments completed: Virtual Based Care may be provided alone or in conjunction with Phase 2 Cardiac Rehab based on patient barriers.: Yes   Diet - low sodium heart healthy   Complete by: As directed    Increase activity slowly   Complete by: As directed        SUBJECTIVE:  Patient evaluated at bedside on day of discharge. She reports continuing to feel better as compared to when she was admitted. She has no concerns at this time.   Discharge Vitals:   BP 136/74 (BP Location: Left Arm)   Pulse 63   Temp (!) 97.3 F (36.3 C) (Oral)   Resp 17   Ht 5' 2"  (1.575 m)   Wt 89.9 kg   SpO2 100%   BMI 36.25 kg/m   OBJECTIVE:  Constitutional: Patient is sitting in bedside recliner, talking on the phone. No acute distress noted. Cardio: Regular rate and rhythm. S3 noted. Normal radial, PT pulses bilaterally. Pulm:  Bilateral basilar crackles noted. Normal work of breathing. She is on room air. Abdomen: Soft, non-distended, non-tender. MSK: Bilateral 1+ pitting edema. Extremities are cool to touch. Skin: Skin is cool and dry. Neuro:Alert and oriented x 3. No focal deficit noted. Psych:Normal mood and affect.   Pertinent Labs, Studies, and Procedures:  CBC Latest Ref Rng & Units 06/16/2021 06/16/2021 02/21/2020  WBC 4.0 - 10.5 K/uL 9.2 10.5 8.8  Hemoglobin 12.0 - 15.0 g/dL 12.8 12.6 11.8(L)  Hematocrit 36.0 - 46.0 % 41.9 41.3 38.4  Platelets 150 - 400  K/uL 180 258 198    CMP Latest Ref Rng & Units 06/25/2021 06/17/2021 06/16/2021  Glucose 70 - 99 mg/dL 161(H) 111(H) 333(H)  BUN 8 - 23 mg/dL 24(H) 23 15  Creatinine 0.44 - 1.00 mg/dL 1.16(H) 1.07(H) 1.08(H)  Sodium 135 - 145 mmol/L 140 142 137  Potassium 3.5 - 5.1 mmol/L 4.5 5.0 4.3  Chloride 98 - 111 mmol/L 96(L) 103 102  CO2 22 - 32 mmol/L 25 30 26   Calcium 8.9 - 10.3 mg/dL 9.1 9.1 9.4  Total Protein 6.5 - 8.1 g/dL - 6.4(L) 6.8  Total Bilirubin 0.3 - 1.2 mg/dL - 1.0 0.7  Alkaline Phos 38 - 126 U/L - 150(H) 152(H)  AST 15 - 41 U/L - 36 64(H)  ALT 0 - 44 U/L - 35 43    DG Chest Port 1 View  Result Date: 06/16/2021 CLINICAL DATA:  Shortness of breath for 1 day EXAM: PORTABLE CHEST 1 VIEW COMPARISON:  07/14/2020 FINDINGS: Cardiopericardial enlargement and vascular pedicle widening. Defibrillator lead into the right ventricle. Hazy bilateral lower chest attributed to pleural fluid with diffuse interstitial opacity. No pneumothorax. IMPRESSION: CHF. Electronically Signed   By: Jorje Guild M.D.   On: 06/16/2021 05:22   ECHOCARDIOGRAM COMPLETE  Result Date: 06/16/2021    ECHOCARDIOGRAM REPORT   Patient Name:   KEYANNA SANDEFER Date of Exam: 06/16/2021 Medical Rec #:  [id number]    Height:       62.0 in Accession #:    [id number]   Weight:       205.0 lb Date of Birth:  06-25-1951   BSA:          1.932 m Patient Age:    75 years     BP:           130/102  mmHg Patient Gender: F            HR:           71 bpm. Exam Location:  Inpatient Procedure: 2D Echo, 3D Echo, Color Doppler and Cardiac Doppler STAT ECHO Indications:    I50.9* Heart failure (unspecified)  History:        Patient has prior history of Echocardiogram examinations, most                 recent 02/21/2020. Defibrillator; Risk Factors:Hypertension,                 Diabetes and Dyslipidemia.  Sonographer:    Raquel Sarna Senior RDCS Referring Phys: [phone] [place]  1. Left ventricular ejection fraction, by estimation, is 20 to 25%. Left ventricular ejection fraction by PLAX is 23 %. The left ventricle has severely decreased function. The left ventricle demonstrates global hypokinesis. The left ventricular internal  cavity size was mildly dilated. Left ventricular diastolic parameters are consistent with Grade II diastolic dysfunction (pseudonormalization). Elevated left ventricular end-diastolic pressure.  2. Right ventricular systolic function is normal. The right ventricular size is normal. There is severely elevated pulmonary artery systolic pressure.  3. Left atrial size was severely dilated.  4. The mitral valve is degenerative. Trivial mitral valve regurgitation. No evidence of mitral stenosis.  5. The aortic valve is tricuspid. There is mild calcification of the aortic valve. There is mild thickening of the aortic valve. Aortic valve regurgitation is mild. No aortic stenosis is present. Aortic regurgitation PHT measures 430 msec.  6. Aortic dilatation noted. There is mild dilatation of the ascending aorta, measuring 37 mm.  7. The  inferior vena cava is dilated in size with <50% respiratory variability, suggesting right atrial pressure of 15 mmHg. FINDINGS  Left Ventricle: Left ventricular ejection fraction, by estimation, is 20 to 25%. Left ventricular ejection fraction by PLAX is 23 %. The left ventricle has severely decreased function. The left ventricle demonstrates global  hypokinesis. The left ventricular internal cavity size was mildly dilated. There is no left ventricular hypertrophy. Left ventricular diastolic parameters are consistent with Grade II diastolic dysfunction (pseudonormalization). Elevated left ventricular end-diastolic pressure. Right Ventricle: The right ventricular size is normal. No increase in right ventricular wall thickness. Right ventricular systolic function is normal. There is severely elevated pulmonary artery systolic pressure. The tricuspid regurgitant velocity is 3.62 m/s, and with an assumed right atrial pressure of 15 mmHg, the estimated right ventricular systolic pressure is 20.1 mmHg. Left Atrium: Left atrial size was severely dilated. Right Atrium: Right atrial size was normal in size. Pericardium: There is no evidence of pericardial effusion. Mitral Valve: The mitral valve is degenerative in appearance. Mild mitral annular calcification. Trivial mitral valve regurgitation. No evidence of mitral valve stenosis. Tricuspid Valve: The tricuspid valve is normal in structure. Tricuspid valve regurgitation is mild . No evidence of tricuspid stenosis. Aortic Valve: The aortic valve is tricuspid. There is mild calcification of the aortic valve. There is mild thickening of the aortic valve. Aortic valve regurgitation is mild. Aortic regurgitation PHT measures 430 msec. No aortic stenosis is present. Pulmonic Valve: The pulmonic valve was normal in structure. Pulmonic valve regurgitation is trivial. No evidence of pulmonic stenosis. Aorta: Aortic dilatation noted. There is mild dilatation of the ascending aorta, measuring 37 mm. Venous: The inferior vena cava is dilated in size with less than 50% respiratory variability, suggesting right atrial pressure of 15 mmHg. IAS/Shunts: No atrial level shunt detected by color flow Doppler. Additional Comments: A device lead is visualized.  LEFT VENTRICLE PLAX 2D LV EF:         Left            Diastology                 ventricular     LV e' medial:    4.79 cm/s                ejection        LV E/e' medial:  21.7                fraction by     LV e' lateral:   6.20 cm/s                PLAX is 23      LV E/e' lateral: 16.8                %. LVIDd:         5.70 cm LVIDs:         5.10 cm LV PW:         1.00 cm LV IVS:        0.70 cm         3D Volume EF: LVOT diam:     1.90 cm         3D EF:        26 % LV SV:         41              LV EDV:       186 ml LV SV Index:  21              LV ESV:       137 ml LVOT Area:     2.84 cm        LV SV:        49 ml  RIGHT VENTRICLE RV S prime:     8.16 cm/s TAPSE (M-mode): 2.0 cm LEFT ATRIUM             Index       RIGHT ATRIUM           Index LA diam:        4.60 cm 2.38 cm/m  RA Area:     14.60 cm LA Vol (A2C):   97.0 ml 50.21 ml/m RA Volume:   36.20 ml  18.74 ml/m LA Vol (A4C):   71.6 ml 37.06 ml/m LA Biplane Vol: 83.7 ml 43.33 ml/m  AORTIC VALVE LVOT Vmax:         72.65 cm/s LVOT Vmean:        49.550 cm/s LVOT VTI:          0.144 m AI PHT:            430 msec AR Vena Contracta: 0.20 cm  AORTA Ao Root diam: 2.50 cm Ao Asc diam:  3.70 cm MITRAL VALVE                 TRICUSPID VALVE MV Area (PHT): 4.96 cm      TR Peak grad:   52.4 mmHg MV Decel Time: 153 msec      TR Vmax:        362.00 cm/s MR Peak grad:    113.2 mmHg MR Mean grad:    78.0 mmHg   SHUNTS MR Vmax:         532.00 cm/s Systemic VTI:  0.14 m MR Vmean:        422.0 cm/s  Systemic Diam: 1.90 cm MR PISA:         1.01 cm MR PISA Eff ROA: 6 mm MR PISA Radius:  0.40 cm MV E velocity: 104.00 cm/s MV A velocity: 52.10 cm/s MV E/A ratio:  2.00 Skeet Latch MD Electronically signed by Skeet Latch MD Signature Date/Time: 06/16/2021/1:25:28 PM    Final      Signed: Farrel Gordon, D.O.  Internal Medicine Resident, PGY-1 [facility]  Pager: [phone] 2:44 PM, 06/21/2021

## 2021-07-03 DEATH — deceased

## 2021-07-15 ENCOUNTER — Ambulatory Visit (HOSPITAL_COMMUNITY)
Admission: RE | Admit: 2021-07-15 | Discharge: 2021-07-15 | Disposition: A | Discharge status: Home / Self Care | Payer: Medicare Other | Source: Ambulatory Visit | Attending: Internal Medicine | Admitting: Internal Medicine

## 2021-07-15 ENCOUNTER — Other Ambulatory Visit: Payer: Self-pay

## 2021-07-15 VITALS — BP 138/80 | HR 92 | Wt 202.2 lb

## 2021-07-15 DIAGNOSIS — Z7984 Long term (current) use of oral hypoglycemic drugs: Secondary | ICD-10-CM | POA: Insufficient documentation

## 2021-07-15 DIAGNOSIS — Z79899 Other long term (current) drug therapy: Secondary | ICD-10-CM | POA: Diagnosis not present

## 2021-07-15 DIAGNOSIS — J449 Chronic obstructive pulmonary disease, unspecified: Secondary | ICD-10-CM | POA: Diagnosis not present

## 2021-07-15 DIAGNOSIS — Z95810 Presence of automatic (implantable) cardiac defibrillator: Secondary | ICD-10-CM | POA: Diagnosis not present

## 2021-07-15 DIAGNOSIS — E119 Type 2 diabetes mellitus without complications: Secondary | ICD-10-CM | POA: Diagnosis not present

## 2021-07-15 DIAGNOSIS — J9610 Chronic respiratory failure, unspecified whether with hypoxia or hypercapnia: Secondary | ICD-10-CM | POA: Insufficient documentation

## 2021-07-15 DIAGNOSIS — I428 Other cardiomyopathies: Secondary | ICD-10-CM | POA: Diagnosis not present

## 2021-07-15 DIAGNOSIS — Z716 Tobacco abuse counseling: Secondary | ICD-10-CM | POA: Insufficient documentation

## 2021-07-15 DIAGNOSIS — Z9981 Dependence on supplemental oxygen: Secondary | ICD-10-CM | POA: Diagnosis not present

## 2021-07-15 DIAGNOSIS — I110 Hypertensive heart disease with heart failure: Secondary | ICD-10-CM | POA: Diagnosis not present

## 2021-07-15 DIAGNOSIS — F17210 Nicotine dependence, cigarettes, uncomplicated: Secondary | ICD-10-CM | POA: Insufficient documentation

## 2021-07-15 DIAGNOSIS — I5022 Chronic systolic (congestive) heart failure: Secondary | ICD-10-CM | POA: Diagnosis not present

## 2021-07-15 MED ORDER — SACUBITRIL-VALSARTAN 49-51 MG PO TABS: 1.0000 | ORAL_TABLET | Freq: Two times a day (BID) | ORAL | 11 refills | Status: DC

## 2021-07-15 NOTE — Patient Instructions (Signed)
It was a pleasure seeing you today! ? ?MEDICATIONS: ?-We are changing your medications today ?--Increase Entresto to 49/51 mg (1 tablet) twice daily. You may take 2 tablets of the 24/26 mg strength twice daily until you receive the new strength.  ?-Call if you have questions about your medications. ? ?NEXT APPOINTMENT: ?Return to clinic in 3 weeks with APP Clinic. ? ?In general, to take care of your heart failure: ?-Limit your fluid intake to 2 Liters (half-gallon) per day.   ?-Limit your salt intake to ideally 2-3 grams (2000-3000 mg) per day. ?-Weigh yourself daily and record, and bring that "weight diary" to your next appointment.  (Weight gain of 2-3 pounds in 1 day typically means fluid weight.) ?-The medications for your heart are to help your heart and help you live longer.   ?-Please contact us before stopping any of your heart medications. ? ?Call the clinic at [phone] with questions or to reschedule future appointments.  ?

## 2021-07-15 NOTE — Progress Notes (Signed)
Primary Care: Maudie Mercury, MD HF Cardiologist: Dr. Haroldine Laws   HPI: Ms Fiallo is a 70 y.o. with history of DMII, COPD, chronic respiratory failure on home oxygen, HTN, [employer] ICD, tobacco abuse, NICM, and chronic systolic heart failure.    Presented to [facility] 06/2021 with increased shortness of breath. Admitted for A/C CHF. Started on IV Lasix. Had ECHO that showed EF 20-25% and normal RV. AHF consulted for management, some concern that she may have low output. R/LHC deferred as symptoms improved with diuresis. GDMT was limited by elevated potassium. Cardiac rehab consulted. Losartan and spironolactone held at discharge.    On 06/25/2021, she returned for post hospitalization HF follow up. SOB with walking on flat ground, does not lift or walk up stairs. Wears 2-3 L oxygen at night. Denied CP, dizziness, edema, or PND/Orthopnea. Appetite was ok. Noted that she does not consistently weigh at home. Reported taking all medications. Stated she had just started taking Entresto twice daily, thought it was once daily. Lives by herself, friend gives her a ride. Smoking 1/2 ppd. Planned on starting wellbutrin soon to try and quit. Drinks 1-2 wine coolers on Saturdays.   Today she returns to HF clinic for pharmacist medication titration. At last visit with APP, spironolactone 12.5 mg daily was started and furosemide was decreased to 40 mg BID. Overall she is feeling fine. No dizziness lightheadedness, or fatigue. No chest pain. Has palpitations occasionally after exertion. She reports her breathing is good. She has had some DOE from house work, but after resting, her breathing returns to normal. She now walks around the store instead of riding in the scooter and reports tolerating it fine since she is able to go at her own pace. Her weight at home is usually 200-201 lbs. She takes furosemide 40 mg BID and has not had to take any additional doses. She reports some LEE at home and will elevate feet to help relieve. No  PND/orthopnea. Her appetite is "so-so" which she attributes to Victoza curbing her appetite. She eats out a lot making it difficult to limit salt, but she does not add extra salt.  HF Medications: Furosemide 40 mg BID Metoprolol succinate 50 mg daily Entresto 24/26 mg BID Spironlactone 12.5 mg daily Farxiga 10 mg daily  Has the patient been experiencing any side effects to the medications prescribed?  NO  Does the patient have any problems obtaining medications due to transportation or finances?   No. She has [employer] of regimen: fair Understanding of indications: fair Potential of compliance: good Patient understands to avoid NSAIDs. Patient understands to avoid decongestants.    Pertinent Lab Values: 07/02/2021: Serum creatinine 1.04, BUN 20, Potassium 4.5, Sodium 140  Vital Signs: Weight: 202.2 lbs (last clinic weight: 197.8 lbs) Blood pressure: 138/80  Heart rate: 92   Assessment/Plan: Chronic Systolic Heart Failure, NICM  - Had LHC 2017 normal coronaries. Has Single [employer] Jude ICD.  - Echo (06/2021): EF 20-25% RV normal  - NYHA II-early III. Volume stable - Continue furosemide 40 mg BID - Continue metoprolol succinate 50 mg daily - Increase Entresto to 49/51 mg BID. Repeat BMET in 3 weeks.  -  Continue spironolactone 12.5 mg daily - Continue Farxiga 10 mg daily - Previously asked device RN to enroll in monthly fluid monitoring. - We discussed importance of daily weights and salt and fluid limitations.   Chronic Respiratory Failure - On 2-3 liters oxygen as needed. - Has been referred to  Pulm for ILD.   DMII - Hgb A1C 9.4  - On Farxiga. No GU symptoms.   Tobacco Abuse - Discussed smoking cessation.  - She plans on starting Wellbutrin soon.   Follow up 08/06/2021 with APP clinic  Parthenia Ames, PharmD PGY-1 [facility] Pharmacy Resident  Audry Riles, PharmD, BCPS, [facility], CPP Heart Failure Clinic  Pharmacist [phone]

## 2021-07-26 ENCOUNTER — Telehealth: Payer: Self-pay

## 2021-07-26 DIAGNOSIS — Z9189 Other specified personal risk factors, not elsewhere classified: Secondary | ICD-10-CM

## 2021-07-26 NOTE — Progress Notes (Signed)
[facility])                                            [place] Team                                        Statin Quality Measure Assessment    07/26/2021  [name] Mar 27, 1951 [id number]  Per review of chart and payor information, this patient has been flagged for non-adherence to the following CMS Quality Measure:   [x]  Statin Use in Persons with Diabetes  []  Statin Use in Persons with Cardiovascular Disease  The 10-year ASCVD risk score (Arnett DK, et al., 2019) is: 40.3%   Values used to calculate the score:     Age: 70 years     Sex: Female     Is Non-Hispanic African American: Yes     Diabetic: Yes     Tobacco smoker: Yes     Systolic Blood Pressure: 263 mmHg     Is BP treated: Yes     HDL Cholesterol: 63 mg/dL     Total Cholesterol: 170 mg/dL  Patient has prior documentation of non-compliance with medications. I called patient today and she reported that she has been taking atorvastatin yet has not picked up atorvastatin when it was prescribed last month. Patient reported atorvastatin wasn't available. However, patient stated she has not picked up any prescriptions this month. When reviewing Dr. Brantley Stage, patient has not picked up atorvastatin, furosemide, spironolactone, Entresto (recent dose change of 49-51 refilled), and Farxiga from [place] recently.   I offered patient consent to get above prescriptions refilled and she provided consent. Additionally, she stated she was supposed to have a prescription of NTG.   Appointment with PCP was scheduled for 07/28/21 but it was canceled and has not been rescheduled.   If appropriate, please consider patient outreach regarding medication compliance and discuss NTG.   Thank you for your time,  [name], [place] Cell: [phone]

## 2021-07-28 ENCOUNTER — Encounter: Payer: Medicare Other | Admitting: Internal Medicine

## 2021-07-29 DIAGNOSIS — J449 Chronic obstructive pulmonary disease, unspecified: Secondary | ICD-10-CM | POA: Diagnosis not present

## 2021-07-29 DIAGNOSIS — I509 Heart failure, unspecified: Secondary | ICD-10-CM | POA: Diagnosis not present

## 2021-08-02 ENCOUNTER — Telehealth (HOSPITAL_COMMUNITY): Payer: Self-pay

## 2021-08-02 NOTE — Telephone Encounter (Signed)
Pt stated that she is unable to do cardiac rehab at this time, I advised her to call back if anything changes. Pt understood. Closed referral.

## 2021-08-05 NOTE — Progress Notes (Signed)
ADVANCED HF CLINIC  NOTE   Primary Care: Maudie Mercury, MD HF Cardiologist: Dr. Haroldine Laws  HPI: Ms Tobler is a 70 y.o. with history of DMII, COPD, chronic respiratory failure on home oxygen, DMII, HTN, St Jude ICD, tobacco abuse, NICM, and chronic systolic heart failure.    Presented to [facility] 9/22 with increased shortness of breath. Admitted for A/C CHF. Started on IV lasix. Had ECHO that showed EF 20-25% and normal RV. AHF consulted for management, some concern that she may have low output. R/LHC deferred as symptoms improved with diuresis. GDMT was limited by elevated potassium. Cardiac rehab consulted. Losartan and spiro held at discharge.    Today she returns for HF follow up. She has some SOB walking on flat ground but no worse. She does not go up stairs. Overall feeling fine. Denies CP, dizziness, edema, or PND/Orthopnea. Appetite ok. No fever or chills. Weight at home 204 pounds. She has stopped taking her spiro. Smoking 1/2 ppd, rare ETOH. Wears 3 L oxygen at night. She eats at [employer] daily; gets burgers and fries. Lives along but friend helps with transportation.  Cardiac Studies  - Echo 9/22 EF 20-25% RV normal , LA severely reduced,  - Echo 2018 EF 25-30% Grade IDD - LHC 2017 Normal Coronaries.   Past Medical History:  Diagnosis Date   ATN (acute tubular necrosis) ([place]) 07/15/2014   Automatic implantable cardioverter-defibrillator in situ    Automatic implantable cardioverter-defibrillator in situ 05/04/2007   Qualifier: Diagnosis of  By: Isla Pence     Cardiac defibrillator in situ    Atlas II VR (SJM) implanted by Dr Lovena Le   CHF (congestive heart failure) ([place])    Chronic combined systolic and diastolic heart failure ([place])    a. EF 35-40% in past;  b. Echo 7/13:  EF 45-50%, Gr 2 diast dysfn, mild AI, mild MAC, trivial MR, mild LAE, PASP 47.   Chronic ulcer of leg ([place])    04-09-15 resolved-not a problem.   Colon polyps 04/12/2013   Rectosigmoid polyp   COPD (chronic  obstructive pulmonary disease) (HCC)    O2 at night   Depression    Diabetes mellitus    Diabetes mellitus, type 2 ([place]) 04/03/2012   HIGH RISK FEET.. Please have patient take shoes and socks off every visit for visual foot inspection.     Eczema    Elevated alkaline phosphatase level    GGT and 5'nucleotidase 8/13 normal   Health care maintenance 01/22/2013   Surgically absent cervix- no pap needed (Path report 07/2000: uterine body with attached bilateral  adnexa and separate cervix.)   History of oxygen administration    oxygen @ 2 l/m nasally bedtime 24/7   HTN (hypertension)    Hx of cardiac cath    a. [place] 2003 normal;  b. LHC 6/13:  Mild calcification in the LM, o/w normal coronary arteries, EF 45%.    Hyperkalemia 08/08/2017   Hyperlipidemia    Elevated triglyceride in 2019   Hyperlipidemia associated with type 2 diabetes mellitus ([place]) 05/25/2007   Qualifier: Diagnosis of  By: Hassell Done FNP, Nykedtra     Hyperthyroidism, subclinical    HYPERTHYROIDISM, SUBCLINICAL 05/06/2009   Qualifier: Diagnosis of  By: Darrick Meigs     Implantable cardioverter-defibrillator (ICD) generator end of life    Lipoma    Need for hepatitis C screening test 04/25/2019   NICM (nonischemic cardiomyopathy) ([place])    Obesity    On home oxygen therapy    "  2L; 24/7" (10/11/2016)   Post menopausal syndrome    Shortness of breath 07/14/2017   Skin rash 07/12/2018   Sleep apnea    pt denies 04/12/2013   Current Outpatient Medications  Medication Sig Dispense Refill   albuterol (VENTOLIN HFA) 108 (90 Base) MCG/ACT inhaler Inhale 2 puffs into the lungs every 6 (six) hours as needed for wheezing or shortness of breath. 8 g 2   aspirin EC 81 MG tablet Take 81 mg by mouth daily. Swallow whole.     atorvastatin (LIPITOR) 80 MG tablet Take 1 tablet (80 mg total) by mouth at bedtime. 90 tablet 3   Blood Glucose Monitoring Suppl (ACCU-CHEK GUIDE ME) w/Device KIT by Does not apply route.     buPROPion (WELLBUTRIN XL)  150 MG 24 hr tablet Take 150 mg by mouth daily.     calcium-vitamin D (OSCAL WITH D) 500-200 MG-UNIT tablet Take 1 tablet by mouth 2 (two) times daily. 90 tablet 6   dapagliflozin propanediol (FARXIGA) 10 MG TABS tablet Take 1 tablet (10 mg total) by mouth daily. 30 tablet 2   furosemide (LASIX) 40 MG tablet Take 1 tablet (40 mg total) by mouth 2 (two) times daily. 180 tablet 3   insulin degludec (TRESIBA) 100 UNIT/ML FlexTouch Pen Inject 22 Units into the skin daily.     insulin lispro (HUMALOG) 100 UNIT/ML injection Inject 6 Units into the skin 3 (three) times daily with meals.     Insulin Pen Needle (PEN NEEDLES) 32G X 4 MM MISC Use to inject insulin 3 times times per day 100 each 3   Lancets Misc. (ACCU-CHEK FASTCLIX LANCET) KIT Lancing device, use with lancets for blood glucose monitoring. 1 kit 0   Lancets MISC 1 Act by Does not apply route 4 (four) times daily. 200 each 3   liraglutide (VICTOZA) 18 MG/3ML SOPN Inject 1.52m daily into the skin 30 mL 3   metoprolol succinate (TOPROL-XL) 50 MG 24 hr tablet TAKE 1 TABLET(50 MG) BY MOUTH DAILY WITH OR IMMEDIATELY FOLLOWING A MEAL 90 tablet 3   sacubitril-valsartan (ENTRESTO) 49-51 MG Take 1 tablet by mouth 2 (two) times daily. 60 tablet 11   umeclidinium-vilanterol (ANORO ELLIPTA) 62.5-25 MCG/INH AEPB Inhale 1 puff into the lungs daily. 1 each 3   No current facility-administered medications for this encounter.   Allergies  Allergen Reactions   Actos [Pioglitazone] Other (See Comments)    REACTION: congestive heart failure    Naproxen Other (See Comments)    5055mdose made her sleep for two days   Rosiglitazone Hives   Tomato Itching   Hydrocodone-Acetaminophen Itching   Hydrocortisone Hives, Itching and Other (See Comments)    unknown   Social History   Socioeconomic History   Marital status: Significant Other    Spouse name: Not on file   Number of children: 3   Years of education: 1128 Highest education level: 11th grade   Occupational History   Occupation: disabled  Tobacco Use   Smoking status: Every Day    Packs/day: 0.50    Years: 45.00    Pack years: 22.50    Types: Cigarettes   Smokeless tobacco: Never   Tobacco comments:    currently smoking 3-4 cigs per day  Vaping Use   Vaping Use: Never used  Substance and Sexual Activity   Alcohol use: Not Currently    Alcohol/week: 3.0 standard drinks    Types: 3 Shots of liquor per week   Drug use:  No   Sexual activity: Yes    Partners: Male    Birth control/protection: Surgical  Other Topics Concern   Not on file  Social History Narrative   ** Merged History Encounter **       Married   Patient enjoys going to [employer], church and spending time with family   Social Determinants of Health   Financial Resource Strain: Low Risk    Difficulty of Paying Living Expenses: Not very hard  Food Insecurity: No Food Insecurity   Worried About Charity fundraiser in the Last Year: Never true   Arboriculturist in the Last Year: Never true  Transportation Needs: No Transportation Needs   Lack of Transportation (Medical): No   Lack of Transportation (Non-Medical): No  Physical Activity: Not on file  Stress: Not on file  Social Connections: Not on file  Intimate Partner Violence: Not on file   Family History  Problem Relation Age of Onset   Stroke Mother    Seizures Father    Heart disease Father    Diabetes Sister    Asthma Maternal Aunt        aunts   Asthma Maternal Uncle        uncles   Heart disease Paternal Aunt        aunts   Heart disease Paternal Uncle        uncles   Heart disease Maternal Aunt        aunts   Heart disease Maternal Uncle        uncles   Heart disease Maternal Grandfather    Breast cancer Daughter    Breast cancer Cousin    Asthma Grandchild    Colon cancer Neg Hx    Colon polyps Neg Hx    Esophageal cancer Neg Hx    Kidney disease Neg Hx    Gallbladder disease Neg Hx    BP 102/76   Pulse 84   Wt 90.9  kg (200 lb 6.4 oz)   SpO2 98%   BMI 36.65 kg/m   Wt Readings from Last 3 Encounters:  08/06/21 90.9 kg (200 lb 6.4 oz)  07/15/21 91.7 kg (202 lb 3.2 oz)  06/25/21 89.7 kg (197 lb 12.8 oz)   PHYSICAL EXAM: General:  NAD. No resp difficulty HEENT: Normal Neck: Supple. No JVD. Carotids 2+ bilat; no bruits. No lymphadenopathy or thryomegaly appreciated. Cor: PMI nondisplaced. Regular rate & rhythm. No rubs, gallops or murmurs. Lungs: Clear Abdomen: Obese,  nontender, nondistended. No hepatosplenomegaly. No bruits or masses. Good bowel sounds. Extremities: No cyanosis, clubbing, rash, 1+ pedal edema Neuro: Alert & oriented x 3, cranial nerves grossly intact. Moves all 4 extremities w/o difficulty. Affect pleasant.  Device Interrogation: CoreVue recently elevated now down, no VT/VF, stable thoracic impedence (personally reviewed).  ASSESSMENT & PLAN:  Chronic Systolic Heart Failure, NICM  - Had LHC 2017 normal coronaries. Has Single [employer] Jude ICD.  - Echo (9/22): EF 20-25% RV normal  - NYHA II, although not very physically active. Volume OK today on exam and by device. - Re-start spiro 12.5 mg daily. - Continue lasix 40 mg bid. - Continue Toprol XL 50 mg daily. - Continue Farxiga 10 mg daily . - Continue Entresto 49/51 mg bid. - She is [facility] in [facility] clinic. - We discussed importance of daily weights and salt and fluid limitations. - BMET today, repeat in 1 week and 3 weeks.   Chronic Respiratory Failure - On  2-3 liters oxygen as needed. - 98% on RA today. - Has been referred to [facility] for ILD.   DMII - Hgb A1C 9.4  - On Farxiga. No GU symptoms.   Tobacco Abuse - Discussed smoking cessation.  - She has not started Wellbutrin yet.   Follow up with Dr. Haroldine Laws as scheduled next month.  Allena Katz, FNP-BC  08/06/21

## 2021-08-06 ENCOUNTER — Ambulatory Visit (HOSPITAL_BASED_OUTPATIENT_CLINIC_OR_DEPARTMENT_OTHER)
Admission: RE | Admit: 2021-08-06 | Discharge: 2021-08-06 | Disposition: A | Discharge status: Home / Self Care | Payer: Medicare Other | Source: Ambulatory Visit | Attending: Family Medicine | Admitting: Family Medicine

## 2021-08-06 ENCOUNTER — Encounter (HOSPITAL_COMMUNITY): Payer: Self-pay

## 2021-08-06 ENCOUNTER — Other Ambulatory Visit: Payer: Self-pay

## 2021-08-06 ENCOUNTER — Telehealth (HOSPITAL_COMMUNITY): Payer: Self-pay | Admitting: *Deleted

## 2021-08-06 ENCOUNTER — Encounter (HOSPITAL_COMMUNITY): Payer: Self-pay | Admitting: Emergency Medicine

## 2021-08-06 ENCOUNTER — Emergency Department (HOSPITAL_COMMUNITY)
Admission: EM | Admit: 2021-08-06 | Discharge: 2021-08-06 | Disposition: A | Discharge status: Home / Self Care | Payer: Medicare Other | Attending: Emergency Medicine | Admitting: Emergency Medicine

## 2021-08-06 VITALS — BP 102/76 | HR 84 | Wt 200.4 lb

## 2021-08-06 DIAGNOSIS — I5042 Chronic combined systolic (congestive) and diastolic (congestive) heart failure: Secondary | ICD-10-CM | POA: Diagnosis not present

## 2021-08-06 DIAGNOSIS — Z9981 Dependence on supplemental oxygen: Secondary | ICD-10-CM | POA: Insufficient documentation

## 2021-08-06 DIAGNOSIS — I110 Hypertensive heart disease with heart failure: Secondary | ICD-10-CM | POA: Insufficient documentation

## 2021-08-06 DIAGNOSIS — I5022 Chronic systolic (congestive) heart failure: Secondary | ICD-10-CM | POA: Insufficient documentation

## 2021-08-06 DIAGNOSIS — R739 Hyperglycemia, unspecified: Secondary | ICD-10-CM | POA: Insufficient documentation

## 2021-08-06 DIAGNOSIS — Z79899 Other long term (current) drug therapy: Secondary | ICD-10-CM | POA: Insufficient documentation

## 2021-08-06 DIAGNOSIS — E119 Type 2 diabetes mellitus without complications: Secondary | ICD-10-CM | POA: Insufficient documentation

## 2021-08-06 DIAGNOSIS — J849 Interstitial pulmonary disease, unspecified: Secondary | ICD-10-CM | POA: Diagnosis not present

## 2021-08-06 DIAGNOSIS — Z794 Long term (current) use of insulin: Secondary | ICD-10-CM | POA: Diagnosis not present

## 2021-08-06 DIAGNOSIS — J9610 Chronic respiratory failure, unspecified whether with hypoxia or hypercapnia: Secondary | ICD-10-CM | POA: Insufficient documentation

## 2021-08-06 DIAGNOSIS — F17210 Nicotine dependence, cigarettes, uncomplicated: Secondary | ICD-10-CM | POA: Insufficient documentation

## 2021-08-06 DIAGNOSIS — Z7984 Long term (current) use of oral hypoglycemic drugs: Secondary | ICD-10-CM | POA: Insufficient documentation

## 2021-08-06 DIAGNOSIS — Z720 Tobacco use: Secondary | ICD-10-CM

## 2021-08-06 DIAGNOSIS — J449 Chronic obstructive pulmonary disease, unspecified: Secondary | ICD-10-CM | POA: Diagnosis not present

## 2021-08-06 DIAGNOSIS — Z716 Tobacco abuse counseling: Secondary | ICD-10-CM | POA: Insufficient documentation

## 2021-08-06 DIAGNOSIS — Z95810 Presence of automatic (implantable) cardiac defibrillator: Secondary | ICD-10-CM | POA: Diagnosis not present

## 2021-08-06 DIAGNOSIS — Z5321 Procedure and treatment not carried out due to patient leaving prior to being seen by health care provider: Secondary | ICD-10-CM

## 2021-08-06 DIAGNOSIS — I428 Other cardiomyopathies: Secondary | ICD-10-CM | POA: Insufficient documentation

## 2021-08-06 LAB — CBC WITH DIFFERENTIAL/PLATELET
Abs Immature Granulocytes: 0.05 10*3/uL (ref 0.00–0.07)
Basophils Absolute: 0.1 10*3/uL (ref 0.0–0.1)
Basophils Relative: 1 %
Eosinophils Absolute: 0.4 10*3/uL (ref 0.0–0.5)
Eosinophils Relative: 4 %
HCT: 43.1 % (ref 36.0–46.0)
Hemoglobin: 13.4 g/dL (ref 12.0–15.0)
Immature Granulocytes: 1 %
Lymphocytes Relative: 23 %
Lymphs Abs: 2.4 10*3/uL (ref 0.7–4.0)
MCH: 26 pg (ref 26.0–34.0)
MCHC: 31.1 g/dL (ref 30.0–36.0)
MCV: 83.5 fL (ref 80.0–100.0)
Monocytes Absolute: 0.9 10*3/uL (ref 0.1–1.0)
Monocytes Relative: 8 %
Neutro Abs: 6.7 10*3/uL (ref 1.7–7.7)
Neutrophils Relative %: 63 %
Platelets: 260 10*3/uL (ref 150–400)
RBC: 5.16 MIL/uL — ABNORMAL HIGH (ref 3.87–5.11)
RDW: 16.4 % — ABNORMAL HIGH (ref 11.5–15.5)
WBC: 10.4 10*3/uL (ref 4.0–10.5)
nRBC: 0 % (ref 0.0–0.2)

## 2021-08-06 LAB — URINALYSIS, ROUTINE W REFLEX MICROSCOPIC
Bacteria, UA: NONE SEEN
Bilirubin Urine: NEGATIVE
Glucose, UA: >=500 mg/dL — AB
Hgb urine dipstick: NEGATIVE
Ketones, ur: NEGATIVE mg/dL
Leukocytes,Ua: NEGATIVE
Nitrite: NEGATIVE
Protein, ur: NEGATIVE mg/dL
Specific Gravity, Urine: 1.027 (ref 1.005–1.030)
pH: 5 (ref 5.0–8.0)

## 2021-08-06 LAB — BASIC METABOLIC PANEL
Anion gap: 7 (ref 5–15)
BUN: 22 mg/dL (ref 8–23)
CO2: 29 mmol/L (ref 22–32)
Calcium: 8.7 mg/dL — ABNORMAL LOW (ref 8.9–10.3)
Chloride: 97 mmol/L — ABNORMAL LOW (ref 98–111)
Creatinine, Ser: 1.34 mg/dL — ABNORMAL HIGH (ref 0.44–1.00)
GFR, Estimated: 43 mL/min — ABNORMAL LOW (ref >60)
Glucose, Bld: 587 mg/dL (ref 70–99)
Potassium: 3.9 mmol/L (ref 3.5–5.1)
Sodium: 133 mmol/L — ABNORMAL LOW (ref 135–145)

## 2021-08-06 LAB — CBG MONITORING, ED
Glucose-Capillary: 263 mg/dL — ABNORMAL HIGH (ref 70–99)
Glucose-Capillary: 138 mg/dL — ABNORMAL HIGH (ref 70–99)

## 2021-08-06 MED ORDER — SPIRONOLACTONE 25 MG PO TABS: 12.5000 mg | ORAL_TABLET | Freq: Every day | ORAL | 3 refills | Status: DC

## 2021-08-06 NOTE — Telephone Encounter (Signed)
Per Peggy Katz, FNP contact pt have her recheck glucose if still high pt needs to go to the ED. I spoke with pt she is going to the ED.

## 2021-08-06 NOTE — ED Triage Notes (Signed)
Pt arrived POV from home, pt seen at PCP this am, called later told to come to ED for high BS. Pt reports she is asymptomatic and she did drink sweet tea before lab work this am. Pt reports she has been compliant with Insulins X 3.

## 2021-08-06 NOTE — Telephone Encounter (Signed)
Lab called with critical result.  Glucose [place] aware

## 2021-08-06 NOTE — ED Provider Notes (Signed)
Emergency Medicine Provider Triage Evaluation Note  [name] , a 70 y.o. female  was evaluated in triage.  Pt complains of elevated blood sugar. Pt was at her cardiologist today for routine visit, there was blood work checked and her blood sugars were elevated, she was told to come to the ER.  She states she has been feeling fine, she has no symptoms.  No polyuria or polydipsia.  No fevers, cough, chest pain, nausea, vomiting.  She states she drinks sweet tea just before getting her blood work drawn.  She is on 3 different kinds of insulin, states she is taking them all as prescribed.  Review of Systems  Positive: Elevated bgl Negative: N/v  Physical Exam  BP (!) 148/101 (BP Location: Right Arm)   Pulse 83   Temp 98.3 F (36.8 C) (Oral)   Resp 18   SpO2 100%  Gen:   Awake, no distress   Resp:  Normal effort  MSK:   Moves extremities without difficulty. Bilateral feet swelling  Medical Decision Making  Medically screening exam initiated at 6:29 PM.  Appropriate orders placed.  DOMINICK ZERTUCHE was informed that the remainder of the evaluation will be completed by another provider, this initial triage assessment does not replace that evaluation, and the importance of remaining in the ED until their evaluation is complete.  CBC and UA added on.  BMP already obtained from cardiology today.  No acidosis or gap   Franchot Heidelberg, PA-C 08/06/21 1833    Jeanell Sparrow, DO 08/06/21 1953

## 2021-08-06 NOTE — Patient Instructions (Signed)
Restart Spironolactone 12.5 mg (1/2 tab) Daily  Labs done today, we will call you for abnormal results  Your physician recommends that you return for lab work in: 1-2 weeks  Your physician has requested that you regularly monitor and record your blood pressure readings at home. Please use the same machine at the same time of day to check your readings and record them to bring to your follow-up visit.  You have been referred to [facility], they will call you for an appointment  Your physician recommends that you schedule a follow-up appointment in: AS SCHEDULED 09/24/21 at 11:20 with Dr Haroldine Laws, parking code [phone]  Do the following things EVERYDAY: Weigh yourself in the morning before breakfast. Write it down and keep it in a log. Take your medicines as prescribed Eat low salt foods--Limit salt (sodium) to 2000 mg per day.  Stay as active as you can everyday Limit all fluids for the day to less than 2 liters  If you have any questions or concerns before your next appointment please send [place] a message through [place] or call our office at [phone].    TO LEAVE A MESSAGE FOR THE NURSE SELECT OPTION 2, PLEASE LEAVE A MESSAGE INCLUDING: YOUR NAME DATE OF BIRTH CALL BACK NUMBER REASON FOR CALL**this is important as we prioritize the call backs  YOU WILL RECEIVE A CALL BACK THE SAME DAY AS LONG AS YOU CALL BEFORE 4:00 PM  At the [facility], you and your health needs are our priority. As part of our continuing mission to provide you with exceptional heart care, we have created designated Provider Care Teams. These Care Teams include your primary Cardiologist (physician) and Advanced Practice Providers (APPs- Physician Assistants and Nurse Practitioners) who all work together to provide you with the care you need, when you need it.   You may see any of the following providers on your designated Care Team at your next follow up: Dr Glori Bickers Dr Haynes Kerns, NP Lyda Jester, [place] [facility] [place], [place] Audry Riles, PharmD   Please be sure to bring in all your medications bottles to every appointment.

## 2021-08-13 ENCOUNTER — Other Ambulatory Visit (HOSPITAL_COMMUNITY): Payer: Medicare Other

## 2021-08-24 LAB — HM DIABETES EYE EXAM

## 2021-08-31 LAB — HM DIABETES EYE EXAM

## 2021-09-01 ENCOUNTER — Ambulatory Visit (HOSPITAL_COMMUNITY)
Admission: RE | Admit: 2021-09-01 | Discharge: 2021-09-01 | Disposition: A | Discharge status: Home / Self Care | Payer: Medicare Other | Source: Ambulatory Visit | Attending: Family Medicine | Admitting: Family Medicine

## 2021-09-01 DIAGNOSIS — I5042 Chronic combined systolic (congestive) and diastolic (congestive) heart failure: Secondary | ICD-10-CM | POA: Insufficient documentation

## 2021-09-01 LAB — BASIC METABOLIC PANEL
Anion gap: 7 (ref 5–15)
BUN: 24 mg/dL — ABNORMAL HIGH (ref 8–23)
CO2: 30 mmol/L (ref 22–32)
Calcium: 9.4 mg/dL (ref 8.9–10.3)
Chloride: 104 mmol/L (ref 98–111)
Creatinine, Ser: 0.99 mg/dL (ref 0.44–1.00)
GFR, Estimated: >60 mL/min (ref >60)
Glucose, Bld: 159 mg/dL — ABNORMAL HIGH (ref 70–99)
Potassium: 3.7 mmol/L (ref 3.5–5.1)
Sodium: 141 mmol/L (ref 135–145)

## 2021-09-24 ENCOUNTER — Encounter (HOSPITAL_COMMUNITY): Payer: Medicare Other | Admitting: Internal Medicine

## 2021-11-02 ENCOUNTER — Institutional Professional Consult (permissible substitution): Payer: 59 | Admitting: Internal Medicine

## 2021-11-16 ENCOUNTER — Telehealth: Payer: Self-pay

## 2021-11-16 NOTE — Telephone Encounter (Signed)
Loyal Gambler with [place] health care requesting to speak with a nurse about abnormal finding on pt.

## 2021-11-16 NOTE — Telephone Encounter (Signed)
Return call to [facility] NP with [facility]. Stated she saw pt for her yearly health assessment and did a POC A1C which was >13.0. Stated pt told her she had not been checking her BS b/c her meter was broken. Last A1C was 05/24/21 - 9.4. Pt is over due for f/u appt - called pt; agreed to schedule an appt. Call transferred to front office - appt scheduled with Dr Coy Saunas 11/23/21.

## 2021-11-23 ENCOUNTER — Other Ambulatory Visit: Payer: Self-pay

## 2021-11-23 ENCOUNTER — Encounter: Payer: Self-pay | Admitting: Student

## 2021-11-23 ENCOUNTER — Ambulatory Visit (INDEPENDENT_AMBULATORY_CARE_PROVIDER_SITE_OTHER): Payer: 59 | Admitting: Student

## 2021-11-23 VITALS — BP 130/77 | HR 81 | Temp 97.8°F | Ht 62.0 in | Wt 199.9 lb

## 2021-11-23 DIAGNOSIS — I5042 Chronic combined systolic (congestive) and diastolic (congestive) heart failure: Secondary | ICD-10-CM | POA: Diagnosis not present

## 2021-11-23 DIAGNOSIS — Z0000 Encounter for general adult medical examination without abnormal findings: Secondary | ICD-10-CM

## 2021-11-23 DIAGNOSIS — E1142 Type 2 diabetes mellitus with diabetic polyneuropathy: Secondary | ICD-10-CM

## 2021-11-23 DIAGNOSIS — E781 Pure hyperglyceridemia: Secondary | ICD-10-CM | POA: Diagnosis not present

## 2021-11-23 DIAGNOSIS — F17200 Nicotine dependence, unspecified, uncomplicated: Secondary | ICD-10-CM

## 2021-11-23 DIAGNOSIS — I110 Hypertensive heart disease with heart failure: Secondary | ICD-10-CM | POA: Diagnosis not present

## 2021-11-23 DIAGNOSIS — I10 Essential (primary) hypertension: Secondary | ICD-10-CM

## 2021-11-23 LAB — POCT GLYCOSYLATED HEMOGLOBIN (HGB A1C): Hemoglobin A1C: 13.2 % — AB (ref 4.0–5.6)

## 2021-11-23 LAB — GLUCOSE, CAPILLARY: Glucose-Capillary: 106 mg/dL — ABNORMAL HIGH (ref 70–99)

## 2021-11-23 MED ORDER — TRUE FOCUS BLOOD GLUCOSE STRIP VI STRP: ORAL_STRIP | 12 refills | Status: DC

## 2021-11-23 NOTE — Progress Notes (Signed)
CC: Diabetes follow-up  HPI:  [name] is a 71 y.o. female with PMH as below who presents to clinic to follow-up on her diabetes. Please see problem based charting for evaluation, assessment and plan.  Past Medical History:  Diagnosis Date   ATN (acute tubular necrosis) ([place]) 07/15/2014   Automatic implantable cardioverter-defibrillator in situ    Automatic implantable cardioverter-defibrillator in situ 05/04/2007   Qualifier: Diagnosis of  By: Isla Pence     Cardiac defibrillator in situ    Atlas II VR (SJM) implanted by Dr Lovena Le   CHF (congestive heart failure) ([place])    Chronic combined systolic and diastolic heart failure ([place])    a. EF 35-40% in past;  b. Echo 7/13:  EF 45-50%, Gr 2 diast dysfn, mild AI, mild MAC, trivial MR, mild LAE, PASP 47.   Chronic ulcer of leg ([place])    04-09-15 resolved-not a problem.   Colon polyps 04/12/2013   Rectosigmoid polyp   COPD (chronic obstructive pulmonary disease) (HCC)    O2 at night   Depression    Diabetes mellitus    Diabetes mellitus, type 2 ([place]) 04/03/2012   HIGH RISK FEET.. Please have patient take shoes and socks off every visit for visual foot inspection.     Eczema    Elevated alkaline phosphatase level    GGT and 5'nucleotidase 8/13 normal   Health care maintenance 01/22/2013   Surgically absent cervix- no pap needed (Path report 07/2000: uterine body with attached bilateral  adnexa and separate cervix.)   History of oxygen administration    oxygen @ 2 l/m nasally bedtime 24/7   HTN (hypertension)    Hx of cardiac cath    a. [place] 2003 normal;  b. LHC 6/13:  Mild calcification in the LM, o/w normal coronary arteries, EF 45%.    Hyperkalemia 08/08/2017   Hyperlipidemia    Elevated triglyceride in 2019   Hyperlipidemia associated with type 2 diabetes mellitus ([place]) 05/25/2007   Qualifier: Diagnosis of  By: Hassell Done FNP, Nykedtra     Hyperthyroidism, subclinical    HYPERTHYROIDISM, SUBCLINICAL 05/06/2009   Qualifier:  Diagnosis of  By: Darrick Meigs     Implantable cardioverter-defibrillator (ICD) generator end of life    Lipoma    Need for hepatitis C screening test 04/25/2019   NICM (nonischemic cardiomyopathy) ([place])    Obesity    On home oxygen therapy    "2L; 24/7" (10/11/2016)   Post menopausal syndrome    Shortness of breath 07/14/2017   Skin rash 07/12/2018   Sleep apnea    pt denies 04/12/2013    Review of Systems:  Constitutional: Positive for polydipsia and occasional fatigue Eyes: Negative for visual changes Cardiac: Negative for chest pain Respiratory: Positive for occasional dyspnea on exertion Abdomen: Negative for abdominal pain, constipation or diarrhea GU: Positive for polyuria Neuro: Negative for headache, dizziness or weakness  Physical Exam: General: Pleasant, chronically ill elderly female.  No acute distress Neck: Supple.  No JVD. Cardiac: RRR. No murmurs, rubs or gallops. Trace BLE edema Respiratory: Lungs CTAB. No wheezing or crackles. Abdominal: Soft, symmetric and non tender. Normal BS. Skin: Warm, dry and intact without rashes or lesions Extremities: Atraumatic. Full ROM. 1+ radial and DP pulses. Neuro: A&O x 3.  Normal sensation to gross touch. Psych: Appropriate mood and affect.  Vitals:   11/23/21 1008  BP: 130/77  Pulse: 81  Temp: 97.8 F (36.6 C)  TempSrc: Oral  SpO2: 95%  Weight: 199  lb 14.4 oz (90.7 kg)  Height: _0  (1.575 m)    Assessment & Plan:   See Encounters Tab for problem based charting.  Patient discussed with Dr. Thomes Cake, MD, MPH

## 2021-11-23 NOTE — Patient Instructions (Addendum)
Thank you, [name] for allowing [place] to provide your care today. Today we discussed your diabetes, heart failure and hypertension.    For your diabetes, it is very important to decrease your intake of sugary foods and eat a well-balanced diet. I have attached information on diabetes nutrition to help you eat better. I am referring you to an eye doctor to get your yearly diabetes eye exam.   I have ordered the following labs for you:  Lab Orders         Glucose, capillary         BMP8+Anion Gap         Lipid Profile         Microalbumin / Creatinine Urine Ratio         POC Hbg A1C      I will call if any are abnormal. All of your labs can be accessed through "My Chart".  I have place a referrals to Opthalmology   My Chart Access: [email or web address]  Please follow-up in 4 weeks. Bring your meter with you.  Please make sure to arrive 15 minutes prior to your next appointment. If you arrive late, you may be asked to reschedule.    We look forward to seeing you next time. Please call our clinic at [phone] if you have any questions or concerns. The best time to call is Monday-Friday from 9am-4pm, but there is someone available 24/7. If after hours or the weekend, call the main hospital number and ask for the Internal Medicine Resident On-Call. If you need medication refills, please notify your pharmacy one week in advance and they will send [place] a request.   Thank you for letting us take part in your care. Wishing you the best!  Lacinda Axon, MD 11/23/2021, 11:26 AM IM Resident, PGY-2 [place] Milian 41:10

## 2021-11-24 ENCOUNTER — Encounter: Payer: Self-pay | Admitting: Student

## 2021-11-24 LAB — BMP8+ANION GAP
Anion Gap: 16 mmol/L (ref 10.0–18.0)
BUN/Creatinine Ratio: 13 (ref 12–28)
BUN: 14 mg/dL (ref 8–27)
CO2: 26 mmol/L (ref 20–29)
Calcium: 9.8 mg/dL (ref 8.7–10.3)
Chloride: 100 mmol/L (ref 96–106)
Creatinine, Ser: 1.08 mg/dL — ABNORMAL HIGH (ref 0.57–1.00)
Glucose: 83 mg/dL (ref 70–99)
Potassium: 4.1 mmol/L (ref 3.5–5.2)
Sodium: 142 mmol/L (ref 134–144)
eGFR: 56 mL/min/{1.73_m2} — ABNORMAL LOW (ref >59)

## 2021-11-24 LAB — MICROALBUMIN / CREATININE URINE RATIO
Creatinine, Urine: 71.7 mg/dL
Microalb/Creat Ratio: 247 mg/g creat — ABNORMAL HIGH (ref 0–29)
Microalbumin, Urine: 177.2 ug/mL

## 2021-11-24 LAB — LIPID PANEL
Chol/HDL Ratio: 2.8 ratio (ref 0.0–4.4)
Cholesterol, Total: 205 mg/dL — ABNORMAL HIGH (ref 100–199)
HDL: 72 mg/dL (ref >39)
LDL Chol Calc (NIH): 117 mg/dL — ABNORMAL HIGH (ref 0–99)
Triglycerides: 93 mg/dL (ref 0–149)
VLDL Cholesterol Cal: 16 mg/dL (ref 5–40)

## 2021-11-24 NOTE — Assessment & Plan Note (Signed)
Patient instructed to get the shingles vaccine at her local pharmacy.  Follow-up on this at her next office visit.

## 2021-11-24 NOTE — Assessment & Plan Note (Addendum)
Patient's A1c elevated to 13.2% from 9.4% 6 months ago due to dietary indiscretion. Patient reports that over the last few months she has lost her brother and multiple family members. In that timeframe, her diet has not been very healthy. She admits to eating a lot of sweets such as cakes, pies and drinking sodas. Her breakfast includes cereal or oatmeal. For lunch. she usually goes out with family or friends to [employer] or [employer]. For dinner, usually eats baked chicken. She reports running out of her test strips over the last few months and so was not checking her blood sugar.  Last week, her nurse advised her about changing her eating habits and since then she has cut down sugar in her diet. She was able to use some of her husband's test strips over the last week and readings from her meter shows improved fasting blood sugars in the 80s to 90s. However, we did not have comprehensive data from her meter to make significant changes to her current regimen. Plan to refill patient's test strips and have her follow-up in a few weeks for adjustment to her current regimen.  Patient not interested in a Dexcom CGM or further nutrition counseling from [facility] at this point. She plans to work on her diet and follow-up. She reports some polydipsia and polyuria but denies any headaches, blurry vision or dizziness.  Plan: -- Continue Tresiba 22 units daily -- Continue Humalog 6 units 3 times daily with meals -- Continue Farxiga 10 mg -- Continue Victoza 1.8 mg daily injection -- Follow-up urine microalbumin -- Ambulatory referral to ophthalmology for diabetic eye exam -- Follow-up in 4 weeks for adjustment to insulin regimen

## 2021-11-24 NOTE — Assessment & Plan Note (Addendum)
Follows with advanced heart failure clinic. Last heart failure visit in November 2022 after she was hospitalized for acute on chronic heart failure in September. Had ECHO that showed EF 20-25% and normal RV.  Patient's spironolactone was restarted. Weight at cardiology clinic was 200 lbs and currently 199 lbs today. Patient reports she is feeling better but continues to have some dyspnea on exertion. Her lower extremity swelling has improved. On exam, patient has no JVD and no crackles on lung auscultation. She does have trace BLE edema.  Plan: --Continue Lasix 40 mg twice daily --Continue Entresto 49-51 mg twice daily --Continue metoprolol 50 mg daily --Continue Farxiga 10 mg daily --Continue to follow advanced heart failure clinic and cardiac rehab

## 2021-11-24 NOTE — Assessment & Plan Note (Addendum)
Repeat lipid panel today shows increasing total cholesterol 205 and LDL to 117. Patient admits to eating a lot of sweets and processed foods and fast foods in the past few months due to acute stressor of multiple deaths in her family. Counseled on importance of eating a healthy diet, decreasing carbohydrate load and processed food.  -- Continue atorvastatin 80 mg daily -- Repeat lipid panel in 6 months

## 2021-11-24 NOTE — Assessment & Plan Note (Addendum)
Patient's BP relatively well controlled on the current regimen.  She continues to have some leg swelling but denies any dizziness, blurry visions or headaches.  Plan: -- Continue Entresto, spironolactone, metoprolol and Lasix

## 2021-11-24 NOTE — Assessment & Plan Note (Signed)
Patient continues to smoke half a pack of cigarettes per day.  Patient has started smoking cessation medication in the past and was started on Wellbutrin but stopped taking this in November. States she is currently not ready to quit but will start cutting down slowly.  -- Assess readiness to quit in the next office visit

## 2021-11-26 NOTE — Progress Notes (Signed)
Internal Medicine Clinic Attending  Case discussed with Dr. A[place]  At the time of the visit.  We reviewed the resident's history and exam and pertinent patient test results.  I agree with the assessment, diagnosis, and plan of care documented in the resident's note.  

## 2021-12-21 ENCOUNTER — Other Ambulatory Visit: Payer: Self-pay

## 2021-12-21 ENCOUNTER — Encounter: Payer: Self-pay | Admitting: Internal Medicine

## 2021-12-21 ENCOUNTER — Ambulatory Visit (INDEPENDENT_AMBULATORY_CARE_PROVIDER_SITE_OTHER): Payer: 59 | Admitting: Internal Medicine

## 2021-12-21 VITALS — BP 133/82 | HR 97 | Temp 98.0°F | Resp 28 | Ht 62.0 in | Wt 199.9 lb

## 2021-12-21 DIAGNOSIS — I5042 Chronic combined systolic (congestive) and diastolic (congestive) heart failure: Secondary | ICD-10-CM | POA: Diagnosis not present

## 2021-12-21 DIAGNOSIS — E1142 Type 2 diabetes mellitus with diabetic polyneuropathy: Secondary | ICD-10-CM | POA: Diagnosis not present

## 2021-12-21 DIAGNOSIS — I110 Hypertensive heart disease with heart failure: Secondary | ICD-10-CM | POA: Diagnosis not present

## 2021-12-21 DIAGNOSIS — I10 Essential (primary) hypertension: Secondary | ICD-10-CM

## 2021-12-21 DIAGNOSIS — J984 Other disorders of lung: Secondary | ICD-10-CM | POA: Diagnosis not present

## 2021-12-21 MED ORDER — PEN NEEDLES 32G X 4 MM MISC: 3 refills | Status: DC

## 2021-12-21 MED ORDER — INSULIN LISPRO 100 UNIT/ML IJ SOLN: 6.0000 [IU] | Freq: Three times a day (TID) | INTRAMUSCULAR | 2 refills | Status: DC

## 2021-12-21 MED ORDER — ANORO ELLIPTA 62.5-25 MCG/ACT IN AEPB: 1.0000 | INHALATION_SPRAY | Freq: Every day | RESPIRATORY_TRACT | 2 refills | Status: DC

## 2021-12-21 MED ORDER — INSULIN DEGLUDEC 100 UNIT/ML ~~LOC~~ SOPN: 22.0000 [IU] | PEN_INJECTOR | Freq: Every day | SUBCUTANEOUS | 2 refills | Status: DC

## 2021-12-21 MED ORDER — HUMALOG JUNIOR KWIKPEN 100 UNIT/ML ~~LOC~~ SOPN: 6.0000 [IU] | PEN_INJECTOR | Freq: Three times a day (TID) | SUBCUTANEOUS | 0 refills | Status: DC

## 2021-12-21 NOTE — Assessment & Plan Note (Signed)
BP Readings from Last 3 Encounters:  ?12/21/21 133/82  ?11/23/21 130/77  ?08/06/21 (!) 148/101  ?Medications: spirolactone 12.5 mg, metoprolol 50 ?She has not been taking entresto. Unsure if patient has been taking medications with dispense history. She endorses taking medications. Blood pressure is mildly elevated today.  ?A/P: ?Continue spironolactone and metoprolol ?

## 2021-12-21 NOTE — Assessment & Plan Note (Signed)
Medications: Lasix 60 and 40 mg BID, Metoprolol 50 mg qd, farxiga 10 mg, spirolactone 12.5 mg ?Last EF at 20-25% in 09/22. ?Dry weight is around 200 lbs. Weighs 199lbs today. She has not been taking entresto as she ran out and wasn't sure where to get more. Multiple other prescription should have been out as well. Patient endorses 3 times that she takes her medications every morning. She takes care of her own medications. PHQ-9 at 0. She is due for a follow up with heart failure clinic. On exam, her lungs are clear to auscultation bilaterally, trace edema present in lower extremities bilaterally. ?P: ?Encouraged patient to follow-up with heart failure clinic ?

## 2021-12-21 NOTE — Patient Instructions (Signed)
[name], it was a pleasure seeing you today! ? ?Today we discussed: ?Diabetes ?Thank you for bringing in your glucometer today. Please check your blood sugar every single time you are going to inject insulin. Bring your glucometer again when you come back in 4 weeks and we can make changes to insulin at that time if we have more information on what your blood sugar is doing throughout the day. ? ?Weak heart ?Please call and make an appointment with the Heart Failure Clinic. Your swelling and weight looked good today, but it is important to follow-up with them to make sure you continue to do well.  ?Refills available for toprol, entresto, farxiga, and Lasix at [facility] on [address]. ? ?COPD ?I sent in a refill on the anora inhaler. Please use this daily even if you do not feel short of breath.  ? ?I have ordered the following labs today: ? ?Lab Orders  ?No laboratory test(s) ordered today  ?  ? ?I will call if any are abnormal. All of your labs can be accessed through "My Chart" ?  ?My Chart Access: ?[email or web address] ? ? ?Referrals ordered today:  ? ?Referral Orders  ?No referral(s) requested today  ?  ? ?I have ordered the following medication/changed the following medications:  ? ?Stop the following medications: ?Medications Discontinued During This Encounter  ?Medication Reason  ? umeclidinium-vilanterol (ANORO ELLIPTA) 62.5-25 MCG/INH AEPB   ? buPROPion (WELLBUTRIN XL) 150 MG 24 hr tablet Completed Course  ? insulin degludec (TRESIBA) 100 UNIT/ML FlexTouch Pen Reorder  ? insulin lispro (HUMALOG) 100 UNIT/ML injection Reorder  ?  ? ?Start the following medications: ?Meds ordered this encounter  ?Medications  ? umeclidinium-vilanterol (ANORO ELLIPTA) 62.5-25 MCG/ACT AEPB  ?  Sig: Inhale 1 puff into the lungs daily.  ?  Dispense:  60 each  ?  Refill:  2  ? insulin degludec (TRESIBA) 100 UNIT/ML FlexTouch Pen  ?  Sig: Inject 22 Units into the skin daily.  ?   Dispense:  6 mL  ?  Refill:  2  ? insulin lispro (HUMALOG) 100 UNIT/ML injection  ?  Sig: Inject 0.06 mLs (6 Units total) into the skin 3 (three) times daily with meals.  ?  Dispense:  10 mL  ?  Refill:  2  ?  ? ?Follow-up:  1 month   ? ?Please make sure to arrive 15 minutes prior to your next appointment. If you arrive late, you may be asked to reschedule.  ? ?We look forward to seeing you next time. Please call our clinic at [phone] if you have any questions or concerns. The best time to call is Monday-Friday from 9am-4pm, but there is someone available 24/7. If after hours or the weekend, call the main hospital number and ask for the Internal Medicine Resident On-Call. If you need medication refills, please notify your pharmacy one week in advance and they will send [place] a request. ? ?Thank you for letting us take part in your care. Wishing you the best! ? ?Thank you, ?Dr. Howie Ill ?Jeff  ?

## 2021-12-21 NOTE — Assessment & Plan Note (Signed)
Patient presents with history of restrictive lung disease. Prior PFT completed in 2016. She has been out of anora inhaler. She states that she feels short of breath with activity. She has not been using home oxygen at night. On exam, her lungs are clear to ausculation no wheezing or rales present.  ?P: ?Refill anora inhaler ?

## 2021-12-21 NOTE — Progress Notes (Signed)
? ? ? ?Subjective:  ?CC: follow-up for diabetes ? ?HPI: ? ?[name] is a38 yo F with PMH of HFrEF EF 20-25% s/p ICD, COPD, and type 2 diabetes and presents today for follow-up on diabetes. Please see problem based assessment and plan for additional details. ? ?Past Medical History:  ?Diagnosis Date  ? ATN (acute tubular necrosis) ([place]) 07/15/2014  ? Automatic implantable cardioverter-defibrillator in situ   ? Automatic implantable cardioverter-defibrillator in situ 05/04/2007  ? Qualifier: Diagnosis of  By: Isla Pence    ? Cardiac defibrillator in situ   ? Atlas II VR (SJM) implanted by Dr Lovena Le  ? CHF (congestive heart failure) ([place])   ? Chronic combined systolic and diastolic heart failure ([place])   ? a. EF 35-40% in past;  b. Echo 7/13:  EF 45-50%, Gr 2 diast dysfn, mild AI, mild MAC, trivial MR, mild LAE, PASP 47.  ? Chronic ulcer of leg ([place])   ? 04-09-15 resolved-not a problem.  ? Colon polyps 04/12/2013  ? Rectosigmoid polyp  ? COPD (chronic obstructive pulmonary disease) ([place])   ? O2 at night  ? Depression   ? Diabetes mellitus   ? Diabetes mellitus, type 2 ([place]) 04/03/2012  ? HIGH RISK FEET.. Please have patient take shoes and socks off every visit for visual foot inspection.    ? Eczema   ? Elevated alkaline phosphatase level   ? GGT and 5'nucleotidase 8/13 normal  ? Health care maintenance 01/22/2013  ? Surgically absent cervix- no pap needed (Path report 07/2000: uterine body with attached bilateral  adnexa and separate cervix.)  ? History of oxygen administration   ? oxygen @ 2 l/m nasally bedtime 24/7  ? HTN (hypertension)   ? Hx of cardiac cath   ? a. [place] 2003 normal;  b. LHC 6/13:  Mild calcification in the LM, o/w normal coronary arteries, EF 45%.   ? Hyperkalemia 08/08/2017  ? Hyperlipidemia   ? Elevated triglyceride in 2019  ? Hyperlipidemia associated with type 2 diabetes mellitus ([place]) 05/25/2007  ? Qualifier: Diagnosis of  By: Hassell Done FNP, Tori Milks    ? Hyperthyroidism, subclinical   ?  HYPERTHYROIDISM, SUBCLINICAL 05/06/2009  ? Qualifier: Diagnosis of  By: Darrick Meigs    ? Implantable cardioverter-defibrillator (ICD) generator end of life   ? Lipoma   ? Need for hepatitis C screening test 04/25/2019  ? NICM (nonischemic cardiomyopathy) ([place])   ? Obesity   ? On home oxygen therapy   ? "2L; 24/7" (10/11/2016)  ? Post menopausal syndrome   ? Shortness of breath 07/14/2017  ? Skin rash 07/12/2018  ? Sleep apnea   ? pt denies 04/12/2013  ? ? ?Current Outpatient Medications on File Prior to Visit  ?Medication Sig Dispense Refill  ? albuterol (VENTOLIN HFA) 108 (90 Base) MCG/ACT inhaler Inhale 2 puffs into the lungs every 6 (six) hours as needed for wheezing or shortness of breath. 8 g 2  ? aspirin EC 81 MG tablet Take 81 mg by mouth daily. Swallow whole.    ? atorvastatin (LIPITOR) 80 MG tablet Take 1 tablet (80 mg total) by mouth at bedtime. 90 tablet 3  ? Blood Glucose Monitoring Suppl (ACCU-CHEK GUIDE ME) w/Device KIT by Does not apply route.    ? calcium-vitamin D (OSCAL WITH D) 500-200 MG-UNIT tablet Take 1 tablet by mouth 2 (two) times daily. 90 tablet 6  ? dapagliflozin propanediol (FARXIGA) 10 MG TABS tablet Take 1 tablet (10 mg total) by mouth daily.  30 tablet 2  ? furosemide (LASIX) 40 MG tablet Take 1 tablet (40 mg total) by mouth 2 (two) times daily. 180 tablet 3  ? glucose blood (TRUE FOCUS BLOOD GLUCOSE STRIP) test strip Use to check blood sugar 3 times a day 100 each 12  ? Lancets Misc. (ACCU-CHEK FASTCLIX LANCET) KIT Lancing device, use with lancets for blood glucose monitoring. 1 kit 0  ? Lancets MISC 1 Act by Does not apply route 4 (four) times daily. 200 each 3  ? liraglutide (VICTOZA) 18 MG/3ML SOPN Inject 1.95m daily into the skin 30 mL 3  ? metoprolol succinate (TOPROL-XL) 50 MG 24 hr tablet TAKE 1 TABLET(50 MG) BY MOUTH DAILY WITH OR IMMEDIATELY FOLLOWING A MEAL 90 tablet 3  ? sacubitril-valsartan (ENTRESTO) 49-51 MG Take 1 tablet by mouth 2 (two) times daily. 60 tablet 11  ?  spironolactone (ALDACTONE) 25 MG tablet Take 0.5 tablets (12.5 mg total) by mouth daily. 15 tablet 3  ? ?No current facility-administered medications on file prior to visit.  ? ? ?Family History  ?Problem Relation Age of Onset  ? Stroke Mother   ? Seizures Father   ? Heart disease Father   ? Diabetes Sister   ? Asthma Maternal Aunt   ?     aunts  ? Asthma Maternal Uncle   ?     uncles  ? Heart disease Paternal Aunt   ?     aunts  ? Heart disease Paternal Uncle   ?     uncles  ? Heart disease Maternal Aunt   ?     aunts  ? Heart disease Maternal Uncle   ?     uncles  ? Heart disease Maternal Grandfather   ? Breast cancer Daughter   ? Breast cancer Cousin   ? Asthma Grandchild   ? Colon cancer Neg Hx   ? Colon polyps Neg Hx   ? Esophageal cancer Neg Hx   ? Kidney disease Neg Hx   ? Gallbladder disease Neg Hx   ? ? ?Social History  ? ?Socioeconomic History  ? Marital status: Significant Other  ?  Spouse name: Not on file  ? Number of children: 3  ? Years of education: 161 ? Highest education level: 11th grade  ?Occupational History  ? Occupation: disabled  ?Tobacco Use  ? Smoking status: Every Day  ?  Packs/day: 0.50  ?  Years: 45.00  ?  Pack years: 22.50  ?  Types: Cigarettes  ? Smokeless tobacco: Never  ? Tobacco comments:  ?  currently smoking 2 cigs per day  ?Vaping Use  ? Vaping Use: Never used  ?Substance and Sexual Activity  ? Alcohol use: Not Currently  ?  Alcohol/week: 3.0 standard drinks  ?  Types: 3 Shots of liquor per week  ? Drug use: No  ? Sexual activity: Yes  ?  Partners: Male  ?  Birth control/protection: Surgical  ?Other Topics Concern  ? Not on file  ?Social History Narrative  ? ** Merged History Encounter **  ?    ? Married  ? Patient enjoys going to [employer] and spending time with family  ? ?Social Determinants of Health  ? ?Financial Resource Strain: Low Risk   ? Difficulty of Paying Living Expenses: Not very hard  ?Food Insecurity: No Food Insecurity  ? Worried About RCharity fundraiser in the Last Year: Never true  ? Ran Out of Food in the Last  Year: Never true  ?Transportation Needs: No Transportation Needs  ? Lack of Transportation (Medical): No  ? Lack of Transportation (Non-Medical): No  ?Physical Activity: Not on file  ?Stress: Not on file  ?Social Connections: Not on file  ?Intimate Partner Violence: Not on file  ? ? ?Review of Systems: ?ROS negative except for what is noted on the assessment and plan. ? ?Objective:  ? ?Vitals:  ? 12/21/21 0946  ?BP: 133/82  ?Pulse: 97  ?Resp: (!) 28  ?Temp: 98 ?F (36.7 ?C)  ?TempSrc: Oral  ?SpO2: 97%  ?Weight: 199 lb 14.4 oz (90.7 kg)  ?Height: 5' 2" (1.575 m)  ? ? ?Physical Exam: ?Gen: A&O x3 and in no apparent distress, well appearing and nourished. ?Neck: no masses or nodules, AROM intact. ?CV: RRR, no murmurs, S1/S2 presents  ?Resp: Clear to auscultation bilaterally, no wheezing or rales present  ?Abd: BS (+) x4, soft, non-tender abdomen, without hepatosplenomegaly or masses ?MSK: Grossly normal AROM and strength x4 extremities. ?Skin: good skin turgor, no rashes, unusual bruising, or prominent lesions.  ?Neuro: No focal deficits, grossly normal sensation and coordination.  ?Psych: Oriented x3 and responding appropriately. Intact memory, normal mood, judgement, affect, and insight.  ? ? ?Assessment & Plan:  ?DMII (diabetes mellitus, type 2) ([place]) ?Medications: Tresiba 22 units qd, Humalog 6 units TID, Farxiga 10 mg, Victoza 1.8 mg ?HgbA1c elevated from 9.4 to 13.2% in February. She had been eating more sweets, fast food and soda due to stress of losing several family members. She states that she has tried to change her eating habits. She has her glucometer with her today. It shows that she does not consistently check her blood glucose in the morning or prior to injecting before meals. She has 32 checks in last month. Talked with patient about importance of checking blood sugar prior injecting insulin. She is not interested in a CGM at this time. Without  more date, unable to make changes to insulin at this time. From dispense history patient should have been out of medication for some time. She endorses that she takes her medications daily. ?P: ?Follow-up in 1 m

## 2021-12-21 NOTE — Assessment & Plan Note (Addendum)
Medications: Tresiba 22 units qd, Humalog 6 units TID, Farxiga 10 mg, Victoza 1.8 mg ?HgbA1c elevated from 9.4 to 13.2% in February. She had been eating more sweets, fast food and soda due to stress of losing several family members. She states that she has tried to change her eating habits. She has her glucometer with her today. It shows that she does not consistently check her blood glucose in the morning or prior to injecting before meals. She has 32 checks in last month. Talked with patient about importance of checking blood sugar prior injecting insulin. She is not interested in a CGM at this time. Without more date, unable to make changes to insulin at this time. From dispense history patient should have been out of medication for some time. She endorses that she takes her medications daily. ?P: ?Follow-up in 1 month with glucometer ?Foot exam at follow-up in one month ?Refilled tresiba and humalog pens ?

## 2021-12-26 NOTE — Progress Notes (Signed)
Internal Medicine Clinic Attending  Case discussed with Dr. Masters  At the time of the visit.  We reviewed the resident's history and exam and pertinent patient test results.  I agree with the assessment, diagnosis, and plan of care documented in the resident's note.  

## 2022-01-21 ENCOUNTER — Encounter: Payer: Self-pay | Admitting: Internal Medicine

## 2022-01-21 ENCOUNTER — Ambulatory Visit (INDEPENDENT_AMBULATORY_CARE_PROVIDER_SITE_OTHER): Payer: Medicare Other | Admitting: Internal Medicine

## 2022-01-21 VITALS — BP 134/70 | HR 71 | Temp 97.5°F | Ht 62.0 in | Wt 197.7 lb

## 2022-01-21 DIAGNOSIS — E1142 Type 2 diabetes mellitus with diabetic polyneuropathy: Secondary | ICD-10-CM

## 2022-01-21 MED ORDER — SACUBITRIL-VALSARTAN 49-51 MG PO TABS: 1.0000 | ORAL_TABLET | Freq: Two times a day (BID) | ORAL | 11 refills | Status: DC

## 2022-01-21 NOTE — Progress Notes (Signed)
Internal Medicine Clinic Attending  Case discussed with Dr. Coe  At the time of the visit.  We reviewed the resident's history and exam and pertinent patient test results.  I agree with the assessment, diagnosis, and plan of care documented in the resident's note.  

## 2022-01-21 NOTE — Assessment & Plan Note (Addendum)
Presents for reevaluation of her DM. We revived her glucometer. She denies any symptoms from her regimen. I counseled her regarding consistent diet habits and daily exercise. She will keep a log of her meal time and bring them in to compare to her glucometer readings.   ?

## 2022-01-21 NOTE — Progress Notes (Signed)
? ? ?Subjective:  ?CC: DM ? ?HPI: ? ?[name] is a 71 y.o. female with a past medical history stated below and presents today for DM. Please see problem based assessment and plan for additional details. ? ?Past Medical History:  ?Diagnosis Date  ? ATN (acute tubular necrosis) ([place]) 07/15/2014  ? Automatic implantable cardioverter-defibrillator in situ   ? Automatic implantable cardioverter-defibrillator in situ 05/04/2007  ? Qualifier: Diagnosis of  By: Isla Pence    ? Cardiac defibrillator in situ   ? Atlas II VR (SJM) implanted by Dr Lovena Le  ? CHF (congestive heart failure) ([place])   ? Chronic combined systolic and diastolic heart failure ([place])   ? a. EF 35-40% in past;  b. Echo 7/13:  EF 45-50%, Gr 2 diast dysfn, mild AI, mild MAC, trivial MR, mild LAE, PASP 47.  ? Chronic ulcer of leg ([place])   ? 04-09-15 resolved-not a problem.  ? Colon polyps 04/12/2013  ? Rectosigmoid polyp  ? COPD (chronic obstructive pulmonary disease) ([place])   ? O2 at night  ? Depression   ? Diabetes mellitus   ? Diabetes mellitus, type 2 ([place]) 04/03/2012  ? HIGH RISK FEET.. Please have patient take shoes and socks off every visit for visual foot inspection.    ? Eczema   ? Elevated alkaline phosphatase level   ? GGT and 5'nucleotidase 8/13 normal  ? Health care maintenance 01/22/2013  ? Surgically absent cervix- no pap needed (Path report 07/2000: uterine body with attached bilateral  adnexa and separate cervix.)  ? History of oxygen administration   ? oxygen @ 2 l/m nasally bedtime 24/7  ? HTN (hypertension)   ? Hx of cardiac cath   ? a. [place] 2003 normal;  b. LHC 6/13:  Mild calcification in the LM, o/w normal coronary arteries, EF 45%.   ? Hyperkalemia 08/08/2017  ? Hyperlipidemia   ? Elevated triglyceride in 2019  ? Hyperlipidemia associated with type 2 diabetes mellitus ([place]) 05/25/2007  ? Qualifier: Diagnosis of  By: Hassell Done FNP, Tori Milks    ? Hyperthyroidism, subclinical   ? HYPERTHYROIDISM, SUBCLINICAL 05/06/2009  ? Qualifier:  Diagnosis of  By: Darrick Meigs    ? Implantable cardioverter-defibrillator (ICD) generator end of life   ? Lipoma   ? Need for hepatitis C screening test 04/25/2019  ? NICM (nonischemic cardiomyopathy) ([place])   ? Obesity   ? On home oxygen therapy   ? "2L; 24/7" (10/11/2016)  ? Post menopausal syndrome   ? Shortness of breath 07/14/2017  ? Skin rash 07/12/2018  ? Sleep apnea   ? pt denies 04/12/2013  ? ? ?Current Outpatient Medications on File Prior to Visit  ?Medication Sig Dispense Refill  ? albuterol (VENTOLIN HFA) 108 (90 Base) MCG/ACT inhaler Inhale 2 puffs into the lungs every 6 (six) hours as needed for wheezing or shortness of breath. 8 g 2  ? aspirin EC 81 MG tablet Take 81 mg by mouth daily. Swallow whole.    ? atorvastatin (LIPITOR) 80 MG tablet Take 1 tablet (80 mg total) by mouth at bedtime. 90 tablet 3  ? Blood Glucose Monitoring Suppl (ACCU-CHEK GUIDE ME) w/Device KIT by Does not apply route.    ? calcium-vitamin D (OSCAL WITH D) 500-200 MG-UNIT tablet Take 1 tablet by mouth 2 (two) times daily. 90 tablet 6  ? dapagliflozin propanediol (FARXIGA) 10 MG TABS tablet Take 1 tablet (10 mg total) by mouth daily. 30 tablet 2  ? furosemide (LASIX) 40 MG tablet  Take 1 tablet (40 mg total) by mouth 2 (two) times daily. 180 tablet 3  ? glucose blood (TRUE FOCUS BLOOD GLUCOSE STRIP) test strip Use to check blood sugar 3 times a day 100 each 12  ? insulin degludec (TRESIBA) 100 UNIT/ML FlexTouch Pen Inject 22 Units into the skin daily. 6 mL 2  ? Insulin lispro (HUMALOG JUNIOR KWIKPEN) 100 UNIT/ML Inject 6 Units into the skin 3 (three) times daily. 6 mL 0  ? Insulin Pen Needle (PEN NEEDLES) 32G X 4 MM MISC Use to inject insulin 3 times times per day 100 each 3  ? Lancets Misc. (ACCU-CHEK FASTCLIX LANCET) KIT Lancing device, use with lancets for blood glucose monitoring. 1 kit 0  ? Lancets MISC 1 Act by Does not apply route 4 (four) times daily. 200 each 3  ? liraglutide (VICTOZA) 18 MG/3ML SOPN Inject 1.110m daily into  the skin 30 mL 3  ? metoprolol succinate (TOPROL-XL) 50 MG 24 hr tablet TAKE 1 TABLET(50 MG) BY MOUTH DAILY WITH OR IMMEDIATELY FOLLOWING A MEAL 90 tablet 3  ? spironolactone (ALDACTONE) 25 MG tablet Take 0.5 tablets (12.5 mg total) by mouth daily. 15 tablet 3  ? umeclidinium-vilanterol (ANORO ELLIPTA) 62.5-25 MCG/ACT AEPB Inhale 1 puff into the lungs daily. 60 each 2  ? ?No current facility-administered medications on file prior to visit.  ? ? ?Family History  ?Problem Relation Age of Onset  ? Stroke Mother   ? Seizures Father   ? Heart disease Father   ? Diabetes Sister   ? Asthma Maternal Aunt   ?     aunts  ? Asthma Maternal Uncle   ?     uncles  ? Heart disease Paternal Aunt   ?     aunts  ? Heart disease Paternal Uncle   ?     uncles  ? Heart disease Maternal Aunt   ?     aunts  ? Heart disease Maternal Uncle   ?     uncles  ? Heart disease Maternal Grandfather   ? Breast cancer Daughter   ? Breast cancer Cousin   ? Asthma Grandchild   ? Colon cancer Neg Hx   ? Colon polyps Neg Hx   ? Esophageal cancer Neg Hx   ? Kidney disease Neg Hx   ? Gallbladder disease Neg Hx   ? ? ?Social History  ? ?Socioeconomic History  ? Marital status: Significant Other  ?  Spouse name: Not on file  ? Number of children: 3  ? Years of education: 15 ? Highest education level: 11th grade  ?Occupational History  ? Occupation: disabled  ?Tobacco Use  ? Smoking status: Every Day  ?  Packs/day: 0.50  ?  Years: 45.00  ?  Pack years: 22.50  ?  Types: Cigarettes  ? Smokeless tobacco: Never  ? Tobacco comments:  ?  currently smoking 2 cigs per day  ?Vaping Use  ? Vaping Use: Never used  ?Substance and Sexual Activity  ? Alcohol use: Not Currently  ?  Alcohol/week: 3.0 standard drinks  ?  Types: 3 Shots of liquor per week  ? Drug use: No  ? Sexual activity: Yes  ?  Partners: Male  ?  Birth control/protection: Surgical  ?Other Topics Concern  ? Not on file  ?Social History Narrative  ? ** Merged History Encounter **  ?    ? Married  ? Patient  enjoys going to [employer] and spending time  with family  ? ?Social Determinants of Health  ? ?Financial Resource Strain: Low Risk   ? Difficulty of Paying Living Expenses: Not very hard  ?Food Insecurity: No Food Insecurity  ? Worried About Charity fundraiser in the Last Year: Never true  ? Ran Out of Food in the Last Year: Never true  ?Transportation Needs: No Transportation Needs  ? Lack of Transportation (Medical): No  ? Lack of Transportation (Non-Medical): No  ?Physical Activity: Not on file  ?Stress: Not on file  ?Social Connections: Not on file  ?Intimate Partner Violence: Not on file  ? ? ?Review of Systems: ?ROS negative except for what is noted on the assessment and plan. ? ?Objective:  ? ?Vitals:  ? 01/21/22 0933  ?BP: 134/70  ?Pulse: 71  ?Temp: (!) 97.5 ?F (36.4 ?C)  ?TempSrc: Oral  ?SpO2: 94%  ?Weight: 197 lb 11.2 oz (89.7 kg)  ?Height: _0  (1.575 m)  ? ? ?Physical Exam: ?Gen: A&O x3 and in no apparent distress, well appearing and nourished. ?HEENT:  ?  Head - normocephalic, atraumatic.  ?  Eye - visual acuity grossly intact, conjunctiva clear, sclera non-icteric, EOM intact.  ?MSK: Grossly normal AROM and strength x4 extremities. ?Skin: good skin turgor, no rashes, unusual bruising, or prominent lesions.  ?Neuro: No focal deficits, grossly normal sensation and coordination.  ?Psych: Oriented x3 and responding appropriately. Intact memory, normal mood, judgement, affect, and insight.  ? ? ?Assessment & Plan:  ?See Encounters Tab for problem based charting. ? ?Patient discussed with Dr. Angelia Mould ? ? ?Marianna Payment, D.O. ?[facility]  PGY-3 ?Pager: [phone]  Phone: [phone] ?Date 01/21/2022  Time 2:00 PM  ?

## 2022-01-21 NOTE — Patient Instructions (Signed)
Thank you, [name] for allowing [place] to provide your care today. Today we discussed Diabetes and rash.   ? ?Labs/Tests Ordered: ?Lab Orders  ?No laboratory test(s) ordered today  ?  ? ?Referrals Ordered:  ?Referral Orders  ?No referral(s) requested today  ?  ? ?Medication Changes:  ?Medications Discontinued During This Encounter  ?Medication Reason  ? sacubitril-valsartan (ENTRESTO) 49-51 MG Reorder  ?  ? ?Meds ordered this encounter  ?Medications  ? sacubitril-valsartan (ENTRESTO) 49-51 MG  ?  Sig: Take 1 tablet by mouth 2 (two) times daily.  ?  Dispense:  60 tablet  ?  Refill:  11  ?  Note dose change  ?  ? ?Health Maintenance Screening: ?Diabetes Health Maintenance Due  ?Topic Date Due  ? OPHTHALMOLOGY EXAM  08/18/2021  ? HEMOGLOBIN A1C  02/20/2022  ? FOOT EXAM  11/23/2022  ? LIPID PANEL  11/23/2022  ?  ? ?Instructions:  ?- Please try to eat consistent meals each day ?- Dry to work of getting to sleep earlier ?- Please try to wake up at a consistent time every day,  ?- Please keep a log of when you are eating each meal  ? ?Follow up:  1 month   ? ?Remember: If you have any questions or concerns, call our clinic at [phone] or after hours call [phone] and ask for the internal medicine resident on call. ? ?Marianna Payment, D.O. ?[facility] ? ?  ?

## 2022-02-09 ENCOUNTER — Other Ambulatory Visit: Payer: Self-pay

## 2022-02-09 ENCOUNTER — Encounter (HOSPITAL_COMMUNITY): Payer: Self-pay | Admitting: *Deleted

## 2022-02-09 ENCOUNTER — Emergency Department (HOSPITAL_COMMUNITY): Payer: Medicare Other

## 2022-02-09 ENCOUNTER — Observation Stay (HOSPITAL_COMMUNITY)
Admission: EM | Admit: 2022-02-09 | Discharge: 2022-02-10 | Disposition: A | Discharge status: Home / Self Care | Payer: Medicare Other | Attending: Internal Medicine | Admitting: Internal Medicine

## 2022-02-09 DIAGNOSIS — Z794 Long term (current) use of insulin: Secondary | ICD-10-CM | POA: Insufficient documentation

## 2022-02-09 DIAGNOSIS — E119 Type 2 diabetes mellitus without complications: Secondary | ICD-10-CM | POA: Insufficient documentation

## 2022-02-09 DIAGNOSIS — Z20822 Contact with and (suspected) exposure to covid-19: Secondary | ICD-10-CM | POA: Diagnosis not present

## 2022-02-09 DIAGNOSIS — R0602 Shortness of breath: Secondary | ICD-10-CM | POA: Diagnosis present

## 2022-02-09 DIAGNOSIS — I5042 Chronic combined systolic (congestive) and diastolic (congestive) heart failure: Principal | ICD-10-CM | POA: Insufficient documentation

## 2022-02-09 DIAGNOSIS — Z79899 Other long term (current) drug therapy: Secondary | ICD-10-CM | POA: Diagnosis not present

## 2022-02-09 DIAGNOSIS — Z7982 Long term (current) use of aspirin: Secondary | ICD-10-CM | POA: Insufficient documentation

## 2022-02-09 DIAGNOSIS — F32A Depression, unspecified: Secondary | ICD-10-CM | POA: Diagnosis present

## 2022-02-09 DIAGNOSIS — E1165 Type 2 diabetes mellitus with hyperglycemia: Secondary | ICD-10-CM

## 2022-02-09 DIAGNOSIS — E1129 Type 2 diabetes mellitus with other diabetic kidney complication: Secondary | ICD-10-CM

## 2022-02-09 DIAGNOSIS — E785 Hyperlipidemia, unspecified: Secondary | ICD-10-CM | POA: Diagnosis present

## 2022-02-09 DIAGNOSIS — J449 Chronic obstructive pulmonary disease, unspecified: Secondary | ICD-10-CM | POA: Diagnosis not present

## 2022-02-09 DIAGNOSIS — Z7984 Long term (current) use of oral hypoglycemic drugs: Secondary | ICD-10-CM | POA: Diagnosis not present

## 2022-02-09 DIAGNOSIS — F17210 Nicotine dependence, cigarettes, uncomplicated: Secondary | ICD-10-CM | POA: Diagnosis not present

## 2022-02-09 DIAGNOSIS — I509 Heart failure, unspecified: Secondary | ICD-10-CM

## 2022-02-09 DIAGNOSIS — I5023 Acute on chronic systolic (congestive) heart failure: Secondary | ICD-10-CM | POA: Diagnosis present

## 2022-02-09 DIAGNOSIS — I110 Hypertensive heart disease with heart failure: Secondary | ICD-10-CM | POA: Diagnosis not present

## 2022-02-09 DIAGNOSIS — I5020 Unspecified systolic (congestive) heart failure: Secondary | ICD-10-CM | POA: Diagnosis present

## 2022-02-09 DIAGNOSIS — E876 Hypokalemia: Secondary | ICD-10-CM | POA: Diagnosis not present

## 2022-02-09 DIAGNOSIS — E1142 Type 2 diabetes mellitus with diabetic polyneuropathy: Secondary | ICD-10-CM

## 2022-02-09 DIAGNOSIS — I5022 Chronic systolic (congestive) heart failure: Secondary | ICD-10-CM | POA: Diagnosis present

## 2022-02-09 DIAGNOSIS — I5032 Chronic diastolic (congestive) heart failure: Secondary | ICD-10-CM | POA: Diagnosis present

## 2022-02-09 DIAGNOSIS — I10 Essential (primary) hypertension: Secondary | ICD-10-CM | POA: Diagnosis present

## 2022-02-09 LAB — CBC WITH DIFFERENTIAL/PLATELET
Abs Immature Granulocytes: 0.01 10*3/uL (ref 0.00–0.07)
Basophils Absolute: 0 10*3/uL (ref 0.0–0.1)
Basophils Relative: 1 %
Eosinophils Absolute: 0.2 10*3/uL (ref 0.0–0.5)
Eosinophils Relative: 3 %
HCT: 46.1 % — ABNORMAL HIGH (ref 36.0–46.0)
Hemoglobin: 14.3 g/dL (ref 12.0–15.0)
Immature Granulocytes: 0 %
Lymphocytes Relative: 26 %
Lymphs Abs: 1.6 10*3/uL (ref 0.7–4.0)
MCH: 26.1 pg (ref 26.0–34.0)
MCHC: 31 g/dL (ref 30.0–36.0)
MCV: 84.1 fL (ref 80.0–100.0)
Monocytes Absolute: 0.5 10*3/uL (ref 0.1–1.0)
Monocytes Relative: 8 %
Neutro Abs: 4 10*3/uL (ref 1.7–7.7)
Neutrophils Relative %: 62 %
Platelets: 213 10*3/uL (ref 150–400)
RBC: 5.48 MIL/uL — ABNORMAL HIGH (ref 3.87–5.11)
RDW: 16.6 % — ABNORMAL HIGH (ref 11.5–15.5)
WBC: 6.3 10*3/uL (ref 4.0–10.5)
nRBC: 0 % (ref 0.0–0.2)

## 2022-02-09 LAB — RESPIRATORY PANEL BY PCR

## 2022-02-09 LAB — GLUCOSE, CAPILLARY
Glucose-Capillary: 189 mg/dL — ABNORMAL HIGH (ref 70–99)
Glucose-Capillary: 223 mg/dL — ABNORMAL HIGH (ref 70–99)

## 2022-02-09 LAB — BASIC METABOLIC PANEL
Anion gap: 10 (ref 5–15)
BUN: 19 mg/dL (ref 8–23)
CO2: 27 mmol/L (ref 22–32)
Calcium: 9 mg/dL (ref 8.9–10.3)
Chloride: 105 mmol/L (ref 98–111)
Creatinine, Ser: 0.84 mg/dL (ref 0.44–1.00)
GFR, Estimated: >60 mL/min (ref >60)
Glucose, Bld: 244 mg/dL — ABNORMAL HIGH (ref 70–99)
Potassium: 3.6 mmol/L (ref 3.5–5.1)
Sodium: 142 mmol/L (ref 135–145)

## 2022-02-09 LAB — RESP PANEL BY RT-PCR (FLU A&B, COVID) ARPGX2
Influenza A by PCR: NEGATIVE
Influenza B by PCR: NEGATIVE
SARS Coronavirus 2 by RT PCR: NEGATIVE

## 2022-02-09 LAB — BRAIN NATRIURETIC PEPTIDE: B Natriuretic Peptide: 122.9 pg/mL — ABNORMAL HIGH (ref 0.0–100.0)

## 2022-02-09 MED ORDER — BISACODYL 5 MG PO TBEC: 5.0000 mg | DELAYED_RELEASE_TABLET | Freq: Every day | ORAL | Status: DC | PRN

## 2022-02-09 MED ORDER — FUROSEMIDE 10 MG/ML IJ SOLN
40.0000 mg | Freq: Once | INTRAMUSCULAR | Status: AC
Start: 2022-02-09 — End: 2022-02-09
  Administered 2022-02-09: 40 mg via INTRAVENOUS
  Filled 2022-02-09: qty 4

## 2022-02-09 MED ORDER — INSULIN ASPART 100 UNIT/ML IJ SOLN
0.0000 [IU] | Freq: Three times a day (TID) | INTRAMUSCULAR | Status: DC
  Administered 2022-02-10: 2 [IU] via SUBCUTANEOUS

## 2022-02-09 MED ORDER — ENOXAPARIN SODIUM 40 MG/0.4ML IJ SOSY
40.0000 mg | PREFILLED_SYRINGE | INTRAMUSCULAR | Status: DC
  Administered 2022-02-09: 40 mg via SUBCUTANEOUS
  Filled 2022-02-09: qty 0.4

## 2022-02-09 MED ORDER — ONDANSETRON HCL 4 MG/2ML IJ SOLN: 4.0000 mg | Freq: Four times a day (QID) | INTRAMUSCULAR | Status: DC | PRN

## 2022-02-09 MED ORDER — ATORVASTATIN CALCIUM 40 MG PO TABS
80.0000 mg | ORAL_TABLET | Freq: Every day | ORAL | Status: DC
  Administered 2022-02-09: 80 mg via ORAL
  Filled 2022-02-09: qty 2

## 2022-02-09 MED ORDER — SPIRONOLACTONE 12.5 MG HALF TABLET
12.5000 mg | ORAL_TABLET | Freq: Every day | ORAL | Status: DC
  Administered 2022-02-09 – 2022-02-10 (×2): 12.5 mg via ORAL
  Filled 2022-02-09 (×2): qty 1

## 2022-02-09 MED ORDER — NITROGLYCERIN 2 % TD OINT
1.0000 [in_us] | TOPICAL_OINTMENT | Freq: Once | TRANSDERMAL | Status: AC
  Administered 2022-02-09: 1 [in_us] via TOPICAL
  Filled 2022-02-09: qty 1

## 2022-02-09 MED ORDER — ASPIRIN EC 81 MG PO TBEC
81.0000 mg | DELAYED_RELEASE_TABLET | Freq: Every day | ORAL | Status: DC
  Administered 2022-02-09 – 2022-02-10 (×2): 81 mg via ORAL
  Filled 2022-02-09 (×2): qty 1

## 2022-02-09 MED ORDER — OYSTER SHELL CALCIUM/D3 500-5 MG-MCG PO TABS
1.0000 | ORAL_TABLET | Freq: Two times a day (BID) | ORAL | Status: DC
  Administered 2022-02-09 – 2022-02-10 (×2): 1 via ORAL
  Filled 2022-02-09 (×2): qty 1

## 2022-02-09 MED ORDER — ALBUTEROL SULFATE (2.5 MG/3ML) 0.083% IN NEBU: 2.5000 mg | INHALATION_SOLUTION | Freq: Four times a day (QID) | RESPIRATORY_TRACT | Status: DC | PRN

## 2022-02-09 MED ORDER — UMECLIDINIUM-VILANTEROL 62.5-25 MCG/ACT IN AEPB
1.0000 | INHALATION_SPRAY | Freq: Every day | RESPIRATORY_TRACT | Status: DC
  Administered 2022-02-10: 1 via RESPIRATORY_TRACT
  Filled 2022-02-09: qty 14

## 2022-02-09 MED ORDER — SACUBITRIL-VALSARTAN 49-51 MG PO TABS
1.0000 | ORAL_TABLET | Freq: Two times a day (BID) | ORAL | Status: DC
  Administered 2022-02-09 – 2022-02-10 (×2): 1 via ORAL
  Filled 2022-02-09 (×2): qty 1

## 2022-02-09 MED ORDER — DAPAGLIFLOZIN PROPANEDIOL 10 MG PO TABS
10.0000 mg | ORAL_TABLET | Freq: Every day | ORAL | Status: DC
Start: 2022-02-09 — End: 2022-02-10
  Administered 2022-02-09 – 2022-02-10 (×2): 10 mg via ORAL
  Filled 2022-02-09 (×3): qty 1

## 2022-02-09 MED ORDER — SENNOSIDES-DOCUSATE SODIUM 8.6-50 MG PO TABS: 1.0000 | ORAL_TABLET | Freq: Every evening | ORAL | Status: DC | PRN

## 2022-02-09 MED ORDER — INSULIN ASPART 100 UNIT/ML IJ SOLN
0.0000 [IU] | Freq: Every day | INTRAMUSCULAR | Status: DC
  Administered 2022-02-09: 2 [IU] via SUBCUTANEOUS

## 2022-02-09 MED ORDER — INSULIN ASPART 100 UNIT/ML IJ SOLN
6.0000 [IU] | Freq: Three times a day (TID) | INTRAMUSCULAR | Status: DC
  Administered 2022-02-10 (×2): 6 [IU] via SUBCUTANEOUS

## 2022-02-09 MED ORDER — FUROSEMIDE 10 MG/ML IJ SOLN
40.0000 mg | Freq: Once | INTRAMUSCULAR | Status: AC
  Administered 2022-02-09: 40 mg via INTRAVENOUS
  Filled 2022-02-09: qty 4

## 2022-02-09 MED ORDER — ONDANSETRON HCL 4 MG PO TABS: 4.0000 mg | ORAL_TABLET | Freq: Four times a day (QID) | ORAL | Status: DC | PRN

## 2022-02-09 MED ORDER — INSULIN GLARGINE-YFGN 100 UNIT/ML ~~LOC~~ SOLN
20.0000 [IU] | Freq: Every day | SUBCUTANEOUS | Status: DC
  Administered 2022-02-09 – 2022-02-10 (×2): 20 [IU] via SUBCUTANEOUS
  Filled 2022-02-09 (×2): qty 0.2

## 2022-02-09 NOTE — ED Triage Notes (Signed)
Pt arrived by [facility] from home. Reports sob x 2 days with increase in leg swelling. Spo2 92% on room air pta.  ?

## 2022-02-09 NOTE — ED Notes (Signed)
Pt's Spo2 noted to be 88% on tele monitor. This writer walked in the room and noticed the nasal canula unplugged. Pt placed back on 2L [place] with no improvement, and continuing to drop down to 82%. Pt now on 15L [place] with sat's at 99%. EDP and PA aware.  ?

## 2022-02-09 NOTE — Hospital Course (Addendum)
Acute CHF exacerbation ?Coronavirus NL63 infection  ?Patient presented after progressively worsening shortness of breath, PND, orthopnea with physical exam showing rales and bilateral lower extremity edema. Exacerbation thought to be secondary to CHF exacerbation given her elevated BNP and rhonchi and bilateral lower extremity noted on exam. CXR showed cardiomegaly with no pulmonary process. She also was noted  to have coronavirus NL63 on her respiratory panel which may have exacerbated her shortness of breath. Viral infection was managed with supportive care. She was also given IV Lasix as well as her Entresto and spironolactone with improvements in her swelling and shortness of breath. She had good urine output during her hospital stay. Patient was ambulated with O2 sat falling to 87% and improved to 91% on 2L of oxygen while ambulating. Arranged for home oxygen as her current machine is not working.  ? ?Type 2 DM ?Patient has been having increased water intake and glucose was 244 during admission which improved to 169. Last A1c was 13.2. Patient has been taking her home medications for her diabetes however her blood sugar levels remain poorly controlled. Patient is on insulin and Farxiga at home which were continued during this admission. Discussed following up at our internal medicine clinic to better manage her diabetes.  ? ?COPD ?Breathing has been stable and patient is on albuterol and Anovo-Ellipta at home. She is also on home oxygen but only uses intermittently as her machine has not been working properly. Provided supportive measures for coronavirus NL63 infection during this admission. Arranging for O2 at home with falling O2 sat while ambulating.  ? ?

## 2022-02-09 NOTE — H&P (Signed)
?Date: 02/09/2022     ?     ?     ?Patient Name:  [name] MRN: [id number]  ?DOB: March 03, 1951 Age / Sex: 71 y.o., female   ?PCP: Maudie Mercury, MD    ?     ?     ?Medical Service: Internal Medicine Teaching Service    ?     ?     ?Attending Physician: Dr. Velna Ochs, MD    ?First Contact: Clydell Hakim, [place] [address] Pager: [phone]  ?Second Contact: Dr. Idamae Schuller  Pager: [phone]  ?Third Contact Dr. Linwood Dibbles Pager: [phone]  ?     ?After Hours (After 5p/  First Contact Pager: [phone]  ?weekends / holidays): Second Contact Pager: [phone]  ? ?Chief Complaint: Shortness of breath ? ?History of Present Illness: ?[name] is a 71 y.o. F with PMH significant for CHF (EF 25%), type 2 DM, COPD, HTN, hyperlipidemia who presented for worsening shortness of breath, PND, and orthopnea. She initially noted symptoms of cough with clear sputum production, runny nose, and sore throat x4 days ago and began taking cold medications for these symptoms. She was unsure if it was safe to take her other medications and therefore discontinued taking the rest of her home medications for the past x3 days while taking her cold medication. She reports she has been taking her insulin, Lasix, and Wilder Glade but has not been taking her Entresto. Legs have been swollen for the past x3 days. Symptoms of shortness of breath has progressed to where she is using 3 pillows at night instead of her usual 2 pillows and she also endorses PND last night which prompted her to come to the ED today. She did endorse mild improvements in her symptoms with the use of her inhaler. Patient also endorses fatigue and loss of appetite.  Notably, she does drink 4-5 bottles of water (approximately 16 oz or greater) a day and urinates frequently. She denies any sick contact. She endorses some subjective fever last night in her bilateral hands but did not take her temperature. She denies chest pain, palpitations. No nausea, vomiting, or abdominal pain but  does endorse abdominal soreness due to her cough. She continues to have regular bowel movements without any dark or bloody stools.   ? ?Review of Systems: ?A complete ROS was negative except as per HPI.  ? ?ED Course:  ?Patient received Lasix IV 40 mg in the ED with improvements in swelling and shortness of breath. O2 desatted down to low 80s with ambulation and was placed on NRB with improvements to 99%. Patient has now been placed back on 2L [place] and O2 sat improved to the 90s.  ? ?Past medical history:  ?CHF (LVEF 20-25% 06/2021)  ?COPD ?AICD in place ?Hypertension ?Type 2 diabetes mellitus  ?Hyperlipidemia ? ?Surgical history:  ?Hysterectomy around 2000 ? ?Meds:  ?Albuterol 2 puffs PRN for wheezing/shortness of breath ?Aspirin 81 mg daily ?Lipitor 80 mg daily ?Calcium-Vitamin D 500-200 mg BID ?Farxiga 10 mg daily ?Lasix  40 mg BID ?Insulin degludec 22 units daily ?Insulin lispro 6 units TID ?Liraglutide 1.8 mg daily ?Metoprolol succinate 50 mg daily ?Spironolactone 12.5 mg daily ?Anovo-Ellipta 1 puff daily ?Entresto 49-51 mg BID ? ?Allergies: ?Naproxen ?Hydrocodone ?Hydrocodone-acetaminophen  ? ?Family History:  ?CAD in son ?Breast cancer in daughter ?Diabetes in son and daughter ? ?Social History:  ?Retired Quarry manager for 30 years in [employer] ?Currently lives alone ?Able to complete ADLs/iADLs  ?Occasionally drinks EtOH, once  a month ?Smokes 0.5 ppd ?No other drug use  ? ? ?Physical Exam: ?Blood pressure (!) 168/82, pulse 83, temperature 97.7 ?F (36.5 ?C), temperature source Oral, resp. rate (!) 23, SpO2 96 %. ? ?Constitutional:  Alert and oriented x3. In no acute distress. Resting comfortably ?HENT: No ethmoid, supraorbital, or infraorbital sinus tenderness to palpation ?Cardiovascular:  regular rate and rhythm. No murmurs, rubs, or gallop noted on exam. 2+ DP and PT pulses bilaterally, Trace BLE edema, No JVD appreciated ?Respiratory: Nasal canula in place, on 2L of  O2. Rales presented bilaterally up to the  mid-lung. Normal respiratory effort. No accessory muscle use.   ?Abdominal:  Normal bowel sounds. No tenderness to palpation. No rebound or guarding. No organomegaly or masses.  ?Extremities: no asymmetry, normal bulk and tone ?Skin:  No obvious skin lesion. Skin is warm and dry.  ?Psych:  Normal mood and behavior.  ?Neuro: 5/5 strength in lower and upper extremities.  ? ?EKG: personally reviewed my interpretation is normal sinus rhythm with occasional PVCs.  ? ?CXR: personally reviewed my interpretation is cardiomegaly with no acute pulmonary process.  ? ?CBC ?   ?Component Value Date/Time  ? WBC 6.3 02/09/2022 1145  ? RBC 5.48 (H) 02/09/2022 1145  ? HGB 14.3 02/09/2022 1145  ? HGB 14.1 02/08/2017 1551  ? HCT 46.1 (H) 02/09/2022 1145  ? HCT 44.5 02/08/2017 1551  ? PLT 213 02/09/2022 1145  ? PLT 246 02/08/2017 1551  ? MCV 84.1 02/09/2022 1145  ? MCV 87 02/08/2017 1551  ? MCH 26.1 02/09/2022 1145  ? MCHC 31.0 02/09/2022 1145  ? RDW 16.6 (H) 02/09/2022 1145  ? RDW 16.7 (H) 02/08/2017 1551  ? LYMPHSABS 1.6 02/09/2022 1145  ? LYMPHSABS 2.6 02/08/2017 1551  ? MONOABS 0.5 02/09/2022 1145  ? EOSABS 0.2 02/09/2022 1145  ? EOSABS 0.3 02/08/2017 1551  ? BASOSABS 0.0 02/09/2022 1145  ? BASOSABS 0.0 02/08/2017 1551  ? ? ?  Latest Ref Rng & Units 02/09/2022  ? 11:45 AM 11/23/2021  ? 11:37 AM 09/01/2021  ? 11:05 AM  ?CMP  ?Glucose 70 - 99 mg/dL 244   83   159    ?BUN 8 - 23 mg/dL '19   14   24    '$ ?Creatinine 0.44 - 1.00 mg/dL 0.84   1.08   0.99    ?Sodium 135 - 145 mmol/L 142   142   141    ?Potassium 3.5 - 5.1 mmol/L 3.6   4.1   3.7    ?Chloride 98 - 111 mmol/L 105   100   104    ?CO2 22 - 32 mmol/L '27   26   30    '$ ?Calcium 8.9 - 10.3 mg/dL 9.0   9.8   9.4    ?  ?BNP ?   ?Component Value Date/Time  ? BNP 122.9 (H) 02/09/2022 1145  ? ? ?DG Chest 2 View ? ?Result Date: 02/09/2022 ?CLINICAL DATA:  Shortness of breath and chest pressure EXAM: CHEST - 2 VIEW COMPARISON:  06/16/2021 FINDINGS: Unchanged cardiopericardial enlargement and  widening of the vascular pedicle. Left chest cardiac device with lead in the right ventricle. No focal pulmonary opacity. No pleural effusion or pneumothorax. No acute osseous abnormality. IMPRESSION: Redemonstrated cardiomegaly without additional acute pulmonary process. Electronically Signed   By: Merilyn Baba M.D.   On: 02/09/2022 12:05   ? ?Assessment & Plan by Problem: ?Principal Problem: ?  CHF exacerbation ([place]) ? ?Patient is a 71 y.o. female  with PMH significant for CHF (EF 20-25%), Type 2 DM, COPD, hypertension, who presented for subacute worsening shortness of breath in the setting non-adherence to her home medications with patient reporting worsening orthopnea and physical exam showing rales and LE edema and lab work showing elevated BNP.  ? ?Shortness of breath ?Acute CHF exacerbation  ?Patient presented with acute dyspnea. Ddx include CHF exacerbation vs bacterial vs viral pneumonia vs COPD exacerbation. Patient has not been taking her home medications, including her Delene Loll, for the past x3 days with progressive worsening shortness of breath, PND, and orthopnea. Difficult to assess how often she has been taking her Lasix and [place]. Physical exam showed bilateral lower extremity edema and rales up to the mid-lung. BNP was mildly elevated. This makes CHF exacerbation most likely in setting of medication non-adherence and increased fluid intake. Patient has also been feeling improved after receiving Lasix in the ED, making CHF exacerbation more likely. Less likely COPD exacerbation given no wheezing but slight improvement in breathing was noted after albuterol use. Bacterial vs viral pneumonia was considered given her cold-like symptoms along with shortness of breath but least likely as CXR was clear and patient has been afebrile.  ?-Will continue diuresis with IV Lasix 40 mg  ?-Restart patient's Entresto 49-51 mg BID, spironolactone 12.5 mg daily, and Farxiga 10 mg daily ?-Closely monitor I/O and  daily weights. Limit fluid intake given patient's heart failure.  ?-Closely monitor electrolytes while diuresing.  ?-Will hold patient's metoprolol given her acute CHF exacerbation  ?-Due to symptoms consis

## 2022-02-09 NOTE — ED Provider Triage Note (Signed)
Emergency Medicine Provider Triage Evaluation Note ? ?Kirby Funk , a 71 y.o. female  was evaluated in triage.  Pt complains of sob. Increased SOB and legs swelling x 2 days.  Hx of CHF and felt like an exacerbation.  No significant pain or fever ? ?Review of Systems  ?Positive: As above ?Negative: As above ? ?Physical Exam  ?BP (!) 183/92   Pulse 84   Temp 97.7 ?F (36.5 ?C) (Oral)   Resp (!) 22   SpO2 92%  ?Gen:   Awake, no distress   ?Resp:  Normal effort  ?MSK:   Moves extremities without difficulty  ?Other:  2+ pitting edema to BLE ? ?Medical Decision Making  ?Medically screening exam initiated at 11:33 AM.  Appropriate orders placed.  LILIE VEZINA was informed that the remainder of the evaluation will be completed by another provider, this initial triage assessment does not replace that evaluation, and the importance of remaining in the ED until their evaluation is complete. ? ? ?  ?Domenic Moras, PA-C ?02/09/22 1137 ? ?

## 2022-02-09 NOTE — ED Provider Notes (Signed)
?[facility] ?Provider Note ? ? ?CSN: [id number] ?Arrival date & time: 02/09/22  1050 ? ?  ? ?History ? ?Chief Complaint  ?Patient presents with  ? Shortness of Breath  ? ? ?[name] is a 71 y.o. female. ? ?The history is provided by the patient and medical records. No language interpreter was used.  ?Shortness of Breath ? ?71 year old female with significant history of CHF, diabetes, tobacco use, hypertension, brought here via EMS from home with complaints of shortness of breath.  Patient report for the past 2 days she noticed increased shortness of breath as well as fluid retention to her lower extremities bilaterally.  She endorsed occasional cough but denies having fever or chills chest pain nausea vomiting or diaphoresis.  She has been compliant with her medication.  She does not weigh herself on a regular basis.  She does have home O2 that she uses at nighttime. ? ?Home Medications ?Prior to Admission medications   ?Medication Sig Start Date End Date Taking? Authorizing Provider  ?albuterol (VENTOLIN HFA) 108 (90 Base) MCG/ACT inhaler Inhale 2 puffs into the lungs every 6 (six) hours as needed for wheezing or shortness of breath. 04/28/21   Maudie Mercury, MD  ?aspirin EC 81 MG tablet Take 81 mg by mouth daily. Swallow whole.    [provider]  ?atorvastatin (LIPITOR) 80 MG tablet Take 1 tablet (80 mg total) by mouth at bedtime. 06/24/21   Maudie Mercury, MD  ?Blood Glucose Monitoring Suppl (ACCU-CHEK GUIDE ME) w/Device KIT by Does not apply route.    [provider]  ?calcium-vitamin D (OSCAL WITH D) 500-200 MG-UNIT tablet Take 1 tablet by mouth 2 (two) times daily. 02/12/20   Kathi Ludwig, MD  ?dapagliflozin propanediol (FARXIGA) 10 MG TABS tablet Take 1 tablet (10 mg total) by mouth daily. 06/24/21   Maudie Mercury, MD  ?furosemide (LASIX) 40 MG tablet Take 1 tablet (40 mg total) by mouth 2 (two) times daily. 06/25/21   Rafael Bihari, FNP   ?glucose blood (TRUE FOCUS BLOOD GLUCOSE STRIP) test strip Use to check blood sugar 3 times a day 11/23/21   Lacinda Axon, MD  ?insulin degludec (TRESIBA) 100 UNIT/ML FlexTouch Pen Inject 22 Units into the skin daily. 12/21/21   Masters, Joellen Jersey, DO  ?Insulin lispro (HUMALOG JUNIOR KWIKPEN) 100 UNIT/ML Inject 6 Units into the skin 3 (three) times daily. 12/21/21   Masters, Joellen Jersey, DO  ?Insulin Pen Needle (PEN NEEDLES) 32G X 4 MM MISC Use to inject insulin 3 times times per day 12/21/21   Christiana Fuchs, DO  ?Lancets Misc. (ACCU-CHEK FASTCLIX LANCET) KIT Lancing device, use with lancets for blood glucose monitoring. 07/17/18   Kathi Ludwig, MD  ?Lancets MISC 1 Act by Does not apply route 4 (four) times daily. 07/11/18   Kathi Ludwig, MD  ?liraglutide (VICTOZA) 18 MG/3ML SOPN Inject 1.59m daily into the skin 12/26/19   HKathi Ludwig MD  ?metoprolol succinate (TOPROL-XL) 50 MG 24 hr tablet TAKE 1 TABLET(50 MG) BY MOUTH DAILY WITH OR IMMEDIATELY FOLLOWING A MEAL 06/24/21   WMaudie Mercury MD  ?sacubitril-valsartan (ENTRESTO) 49-51 MG Take 1 tablet by mouth 2 (two) times daily. 01/21/22   CMarianna Payment MD  ?spironolactone (ALDACTONE) 25 MG tablet Take 0.5 tablets (12.5 mg total) by mouth daily. 08/06/21   [place], JMaricela Bo FNP  ?umeclidinium-vilanterol (ANORO ELLIPTA) 62.5-25 MCG/ACT AEPB Inhale 1 puff into the lungs daily. 12/21/21   Masters, KJoellen Jersey DO  ?   ? ?  Allergies    ?Actos [pioglitazone], Naproxen, Rosiglitazone, Tomato, Hydrocodone-acetaminophen, and Hydrocortisone   ? ?Review of Systems   ?Review of Systems  ?Respiratory:  Positive for shortness of breath.   ?All other systems reviewed and are negative. ? ?Physical Exam ?Updated Vital Signs ?BP (!) 183/92   Pulse 84   Temp 97.7 ?F (36.5 ?C) (Oral)   Resp (!) 22   SpO2 92%  ?Physical Exam ?Vitals and nursing note reviewed.  ?Constitutional:   ?   General: She is not in acute distress. ?   Appearance: She is well-developed.  ?   Comments:  Resting comfortably, on nasal cannula at 2 L.  ?HENT:  ?   Head: Atraumatic.  ?Eyes:  ?   Conjunctiva/sclera: Conjunctivae normal.  ?Neck:  ?   Vascular: No JVD.  ?Cardiovascular:  ?   Rate and Rhythm: Normal rate and regular rhythm.  ?   Heart sounds: No murmur heard. ?  No friction rub. No gallop.  ?Pulmonary:  ?   Effort: Pulmonary effort is normal.  ?   Breath sounds: Decreased breath sounds and rales present. No wheezing or rhonchi.  ?Musculoskeletal:  ?   Cervical back: Neck supple.  ?   Right lower leg: Edema present.  ?   Left lower leg: Edema present.  ?Skin: ?   Findings: No rash.  ?Neurological:  ?   General: No focal deficit present.  ?   Mental Status: She is alert.  ?Psychiatric:     ?   Mood and Affect: Mood normal.  ? ? ?ED Results / Procedures / Treatments   ?Labs ?(all labs ordered are listed, but only abnormal results are displayed) ?Labs Reviewed  ?RESPIRATORY PANEL BY PCR - Abnormal; Notable for the following components:  ?    Result Value  ? Coronavirus NL63 DETECTED (*)   ? All other components within normal limits  ?BASIC METABOLIC PANEL - Abnormal; Notable for the following components:  ? Glucose, Bld 244 (*)   ? All other components within normal limits  ?CBC WITH DIFFERENTIAL/PLATELET - Abnormal; Notable for the following components:  ? RBC 5.48 (*)   ? HCT 46.1 (*)   ? RDW 16.6 (*)   ? All other components within normal limits  ?BRAIN NATRIURETIC PEPTIDE - Abnormal; Notable for the following components:  ? B Natriuretic Peptide 122.9 (*)   ? All other components within normal limits  ?BASIC METABOLIC PANEL - Abnormal; Notable for the following components:  ? Potassium 3.2 (*)   ? Glucose, Bld 169 (*)   ? All other components within normal limits  ?MAGNESIUM - Abnormal; Notable for the following components:  ? Magnesium 1.6 (*)   ? All other components within normal limits  ?GLUCOSE, CAPILLARY - Abnormal; Notable for the following components:  ? Glucose-Capillary 189 (*)   ? All other  components within normal limits  ?GLUCOSE, CAPILLARY - Abnormal; Notable for the following components:  ? Glucose-Capillary 223 (*)   ? All other components within normal limits  ?GLUCOSE, CAPILLARY - Abnormal; Notable for the following components:  ? Glucose-Capillary 149 (*)   ? All other components within normal limits  ?RESP PANEL BY RT-PCR (FLU A&B, COVID) [id number]  ? ? ?EKG ?EKG Interpretation ? ?Date/Time:  Wednesday Feb 09 2022 11:19:37 EDT ?Ventricular Rate:  88 ?PR Interval:  172 ?QRS Duration: 92 ?QT Interval:  422 ?QTC Calculation: 510 ?R Axis:   -71 ?Text Interpretation: Sinus rhythm with occasional Premature ventricular complexes Possible  Left atrial enlargement Left axis deviation Nonspecific T wave abnormality Prolonged QT Abnormal ECG When compared with ECG of 25-Jun-2021 14:24, PREVIOUS ECG IS PRESENT when compared to prior, longer QTC and new PVC. No STEMI Confirmed by Antony Blackbird [phone]) on 02/09/2022 12:10:41 PM ? ?Radiology ?DG Chest 2 View ? ?Result Date: 02/09/2022 ?CLINICAL DATA:  Shortness of breath and chest pressure EXAM: CHEST - 2 VIEW COMPARISON:  06/16/2021 FINDINGS: Unchanged cardiopericardial enlargement and widening of the vascular pedicle. Left chest cardiac device with lead in the right ventricle. No focal pulmonary opacity. No pleural effusion or pneumothorax. No acute osseous abnormality. IMPRESSION: Redemonstrated cardiomegaly without additional acute pulmonary process. Electronically Signed   By: Merilyn Baba M.D.   On: 02/09/2022 12:05   ? ?Procedures ?Procedures  ? ? ?EMERGENCY DEPARTMENT  US GUIDANCE EXAM ?Emergency Ultrasound:  US Guidance for Needle Guidance ? ?INDICATIONS: Difficult vascular access ?Linear probe used in real-time to visualize location of needle entry through skin. ?  ?PERFORMED BY: Myself ?IMAGES ARCHIVED?: No ?LIMITATIONS: Pain ?VIEWS USED: Transverse ?INTERPRETATION: Needle visualized within vein, Right arm, and Needle gauge 20 ? ? ?Medications Ordered  in ED ?Medications  ?aspirin EC tablet 81 mg (81 mg Oral Given 02/10/22 0833)  ?atorvastatin (LIPITOR) tablet 80 mg (80 mg Oral Given 02/09/22 2202)  ?sacubitril-valsartan (ENTRESTO) 49-51 mg per tablet (1 ta

## 2022-02-09 NOTE — ED Notes (Signed)
This writer ambulated the pt in the hallway with a pulse ox. She was able to maintain a steady gait and Spo2 sats maintained at 95-97% RA, however she states that she felt more short of breath when ambulating. Pt was walked back to her bed and when back in bed, wheezing was noted. EDP and PA aware.  ?

## 2022-02-10 DIAGNOSIS — I5043 Acute on chronic combined systolic (congestive) and diastolic (congestive) heart failure: Secondary | ICD-10-CM | POA: Diagnosis not present

## 2022-02-10 DIAGNOSIS — Z794 Long term (current) use of insulin: Secondary | ICD-10-CM

## 2022-02-10 DIAGNOSIS — F17210 Nicotine dependence, cigarettes, uncomplicated: Secondary | ICD-10-CM

## 2022-02-10 DIAGNOSIS — I5042 Chronic combined systolic (congestive) and diastolic (congestive) heart failure: Secondary | ICD-10-CM | POA: Diagnosis not present

## 2022-02-10 DIAGNOSIS — E1142 Type 2 diabetes mellitus with diabetic polyneuropathy: Secondary | ICD-10-CM

## 2022-02-10 DIAGNOSIS — I110 Hypertensive heart disease with heart failure: Secondary | ICD-10-CM

## 2022-02-10 LAB — GLUCOSE, CAPILLARY
Glucose-Capillary: 149 mg/dL — ABNORMAL HIGH (ref 70–99)
Glucose-Capillary: 85 mg/dL (ref 70–99)

## 2022-02-10 LAB — BASIC METABOLIC PANEL
Anion gap: 8 (ref 5–15)
BUN: 16 mg/dL (ref 8–23)
CO2: 32 mmol/L (ref 22–32)
Calcium: 9.3 mg/dL (ref 8.9–10.3)
Chloride: 101 mmol/L (ref 98–111)
Creatinine, Ser: 0.84 mg/dL (ref 0.44–1.00)
GFR, Estimated: >60 mL/min (ref >60)
Glucose, Bld: 169 mg/dL — ABNORMAL HIGH (ref 70–99)
Potassium: 3.2 mmol/L — ABNORMAL LOW (ref 3.5–5.1)
Sodium: 141 mmol/L (ref 135–145)

## 2022-02-10 LAB — MAGNESIUM: Magnesium: 1.6 mg/dL — ABNORMAL LOW (ref 1.7–2.4)

## 2022-02-10 MED ORDER — POTASSIUM CHLORIDE CRYS ER 20 MEQ PO TBCR
40.0000 meq | EXTENDED_RELEASE_TABLET | Freq: Two times a day (BID) | ORAL | Status: DC
  Administered 2022-02-10: 40 meq via ORAL
  Filled 2022-02-10: qty 2

## 2022-02-10 MED ORDER — FUROSEMIDE 40 MG PO TABS: 40.0000 mg | ORAL_TABLET | Freq: Two times a day (BID) | ORAL | Status: DC

## 2022-02-10 MED ORDER — MAGNESIUM SULFATE 2 GM/50ML IV SOLN
2.0000 g | Freq: Once | INTRAVENOUS | Status: AC
  Administered 2022-02-10: 2 g via INTRAVENOUS
  Filled 2022-02-10: qty 50

## 2022-02-10 MED ORDER — POTASSIUM CHLORIDE CRYS ER 20 MEQ PO TBCR
40.0000 meq | EXTENDED_RELEASE_TABLET | Freq: Once | ORAL | Status: AC
  Administered 2022-02-10: 40 meq via ORAL
  Filled 2022-02-10: qty 2

## 2022-02-10 MED ORDER — FUROSEMIDE 10 MG/ML IJ SOLN
40.0000 mg | Freq: Once | INTRAMUSCULAR | Status: AC
  Administered 2022-02-10: 40 mg via INTRAVENOUS
  Filled 2022-02-10: qty 4

## 2022-02-10 NOTE — Progress Notes (Signed)
Inpatient Diabetes Program Recommendations ? ?AACE/ADA: New Consensus Statement on Inpatient Glycemic Control (2015) ? ?Target Ranges:  Prepandial:   less than 140 mg/dL ?     Peak postprandial:   less than 180 mg/dL (1-2 hours) ?     Critically ill patients:  140 - 180 mg/dL  ? ?Lab Results  ?Component Value Date  ? GLUCAP 85 02/10/2022  ? HGBA1C 13.2 (A) 11/23/2021  ? ? ?Review of Glycemic Control ? Latest Reference Range & Units 02/09/22 21:25 02/10/22 06:32 02/10/22 11:35  ?Glucose-Capillary 70 - 99 mg/dL 223 (H) 149 (H) 85  ?(H): Data is abnormally high ?Diabetes history: Type 2 DM ?Outpatient Diabetes medications: Tresiba 22 units QD, Humalog 6 units TID, Victoza 1.8 mg QD, Farxiga 10 mg qd ?Current orders for Inpatient glycemic control: Semglee 20 units qd, Novolog 6 units tid, Novolog 0-15 units TID & HS, Farxiga 10 mg qd ? ?Inpatient Diabetes Program Recommendations:   ? ?Spoke with patient regarding outpatient diabetes management. Patient verifies home dosing and denies missing doses, however when asked in another way admits to frequently missing doses at meals. ?Reviewed patient's previous A1c of 13.2% from Feb. Explained what a A1c is and what it measures. Also reviewed goal A1c with patient, importance of good glucose control @ home, and blood sugar goals. Reviewed patho of DM, role of pancreas, importance of taking insulin as prescribed, increased risk for infection, vascular changes and commorbidities.  ?Patient has a meter and reports checking 3 times per day. Reports that values have been elevated especially over the course of the last week. She checks at inconsistent times making it difficult to discern trends. Encouraged to take medication and educated on traveling with insulin pens.  ?Admits to drinking sugary beverages and reviewed alternatives. Patient is scheduled to follow up with MD at the end of the month. No further questions at this time.  ? ?Thanks, ?Bronson Curb, MSN,  RNC-OB ?Diabetes Coordinator ?[phone] (8a-5p) ? ? ? ? ?

## 2022-02-10 NOTE — Discharge Summary (Signed)
? ?Name: [name] ?MRN: [id number] ?DOB: Nov 27, 1950 71 y.o. ?PCP: Maudie Mercury, MD ? ?Date of Admission: 02/09/2022 11:13 AM ?Date of Discharge: 02/10/22 ?Attending Physician: Velna Ochs, MD ? ?Discharge Diagnosis: ?Principal Problem: ?  CHF exacerbation ([place]) ?Active Problems: ?  Essential hypertension ?  DMII (diabetes mellitus, type 2) ([place]) ?  Depression ?  Chronic combined systolic and diastolic congestive heart failure ([place]) ?  Hyperlipidemia ?Hypokalemia ?Hypomagnesemia ? ?Discharge Medications: ?Allergies as of 02/10/2022   ? ?   Reactions  ? Actos [pioglitazone] Other (See Comments)  ? REACTION: congestive heart failure  ? Naproxen Other (See Comments)  ? 521m dose made her sleep for two days  ? Rosiglitazone Hives  ? Tomato Itching  ? Hydrocodone-acetaminophen Itching  ? Hydrocortisone Hives, Itching, Other (See Comments)  ? unknown  ? ?  ? ?  ?Medication List  ?  ? ?TAKE these medications   ? ?Accu-Chek [employer]ucent [employer]Kit ?Lancing device, use with lancets for blood glucose monitoring. ?  ?Accu-Chek Guide Me w/Device Kit ?by Does not apply route. ?  ?albuterol 108 (90 Base) MCG/ACT inhaler ?Commonly known as: VENTOLIN HFA ?Inhale 2 puffs into the lungs every 6 (six) hours as needed for wheezing or shortness of breath. ?  ?Anoro Ellipta 62.5-25 MCG/ACT Aepb ?Generic drug: umeclidinium-vilanterol ?Inhale 1 puff into the lungs daily. ?Notes to patient: Take tomorrow morning 02/11/22 ?  ?aspirin EC 81 MG tablet ?Take 81 mg by mouth daily. Swallow whole. ?Notes to patient: Take tomorrow morning 02/11/22 ?  ?atorvastatin 80 MG tablet ?Commonly known as: LIPITOR ?Take 1 tablet (80 mg total) by mouth at bedtime. ?  ?calcium-vitamin D 500-200 MG-UNIT tablet ?Commonly known as: OSCAL WITH D ?Take 1 tablet by mouth 2 (two) times daily. ?  ?clobetasol cream 0.05 % ?Commonly known as: TEMOVATE ?Apply 1 application. topically 2 (two) times daily. ?  ?dapagliflozin propanediol 10 MG Tabs tablet ?Commonly  known as: FIran?Take 1 tablet (10 mg total) by mouth daily. ?Notes to patient: Take tomorrow morning 02/11/22 ?  ?furosemide 40 MG tablet ?Commonly known as: LASIX ?Take 1 tablet (40 mg total) by mouth 2 (two) times daily. ?Notes to patient: Take @ bedtime 02/10/22 ?  ?HumaLOG Junior KwikPen 100 UNIT/ML ?Generic drug: Insulin lispro ?Inject 6 Units into the skin 3 (three) times daily. ?  ?insulin degludec 100 UNIT/ML FlexTouch Pen ?Commonly known as: TRESIBA ?Inject 22 Units into the skin daily. ?  ?Lancets Misc ?1 Act by Does not apply route 4 (four) times daily. ?  ?metoprolol succinate 50 MG 24 hr tablet ?Commonly known as: TOPROL-XL ?TAKE 1 TABLET(50 MG) BY MOUTH DAILY WITH OR IMMEDIATELY FOLLOWING A MEAL ?What changed:  ?how much to take ?how to take this ?when to take this ?additional instructions ?  ?Pen Needles 32G X 4 MM Misc ?Use to inject insulin 3 times times per day ?  ?sacubitril-valsartan 49-51 MG ?Commonly known as: ENTRESTO ?Take 1 tablet by mouth 2 (two) times daily. ?Notes to patient: Take tonight @ bedtime 02/10/22 ?  ?spironolactone 25 MG tablet ?Commonly known as: ALDACTONE ?Take 0.5 tablets (12.5 mg total) by mouth daily. ?Notes to patient: Take tomorrow morning 02/11/22 ?  ?triamcinolone cream 0.1 % ?Commonly known as: KENALOG ?Apply 1 application. topically 2 (two) times daily. ?  ?True Focus Blood Glucose Strip test strip ?Generic drug: glucose blood ?Use to check blood sugar 3 times a day ?  ?Victoza 18 MG/3ML Sopn ?Generic drug: liraglutide ?Inject 1.833mdaily into  the skin ?  ? ?  ? ?  ?  ? ? ?  ?Durable Medical Equipment  ?(From admission, onward)  ?  ? ? ?  ? ?  Start     Ordered  ? 02/10/22 1512  DME Oxygen  Once       ?Question Answer Comment  ?Length of Need 12 Months   ?Mode or (Route) Nasal cannula   ?Liters per Minute 2   ?Frequency Continuous (stationary and portable oxygen unit needed)   ?Oxygen delivery system Gas   ?  ? 02/10/22 1512  ? ?  ?  ? ?  ? ? ?Disposition and  follow-up:   ?[name] was discharged from [facility] in Good condition.  At the hospital follow up visit please address: ? ?CHF exacerbation: Ensure patient is complaint with her medications and titrate as needed.  ?DMII: Hyperglycemic on admission and A1c was high. More controlled while inpatient on home regimen. Instructed to bring glucose readings. Adjust as appropriate at follow up visit.  ? ?2.  Labs / imaging needed at time of follow-up: BMP, Magnesium ? ?3.  Pending labs/ test needing follow-up: None ? ?Follow-up Appointments: ? Follow-up Information   ? ? Maudie Mercury, MD. Daphane Shepherd on 02/15/2022.   ?Specialty: Internal Medicine ?Why: @10 :15am ?Contact information: ?[address] ?[phone] ?[place] [address] ?[phone] ? ? ?  ?  ? ?  ?  ? ?  ? ? ?Hospital Course by problem list: ?HPI from admission ?"[name] is a 71 y.o. F with PMH significant for CHF (EF 25%), type 2 DM, COPD, HTN, hyperlipidemia who presented for worsening shortness of breath, PND, and orthopnea. She initially noted symptoms of cough with clear sputum production, runny nose, and sore throat x4 days ago and began taking cold medications for these symptoms. She was unsure if it was safe to take her other medications and therefore discontinued taking the rest of her home medications for the past x3 days while taking her cold medication. She reports she has been taking her insulin, Lasix, and Wilder Glade but has not been taking her Entresto. Legs have been swollen for the past x3 days. Symptoms of shortness of breath has progressed to where she is using 3 pillows at night instead of her usual 2 pillows and she also endorses PND last night which prompted her to come to the ED today. She did endorse mild improvements in her symptoms with the use of her inhaler. Patient also endorses fatigue and loss of appetite.  Notably, she does drink 4-5 bottles of water (approximately 16 oz or greater) a day and urinates  frequently. She denies any sick contact. She endorses some subjective fever last night in her bilateral hands but did not take her temperature. She denies chest pain, palpitations. No nausea, vomiting, or abdominal pain but does endorse abdominal soreness due to her cough. She continues to have regular bowel movements without any dark or bloody stools."   ?  ?Acute CHF exacerbation ?Coronavirus NL63 infection  ?Patient presented after progressively worsening shortness of breath, PND, orthopnea. Symptoms began in the setting of holding her medications for x4 days while taking her cold medications. Physical exam showing rales up to the mid-lung and bilateral lower extremity edema. Labs showed elevated BNP. CXR showed cardiomegaly with no pulmonary process. She also was noted to have coronavirus NL63 on her respiratory panel which may have exacerbated her shortness of breath. Viral infection was managed with  supportive care. She was also diuresed with IV Lasix 40 mg x3, as well as continuation of her home meds of Entresto and spironolactone. She had improvements in her swelling and shortness of breath. She had good urine output during her hospital stay. Patient was ambulated and O2 sat fell to 87%, improving to 91% on 2L of oxygen while ambulating. Arranged for home oxygen as her current machine is not working.  ?  ?Uncontrolled Type 2 DM ?Patient has been having increased water intake and glucose was 244 during admission which improved to 169. Glucose improved to 149 fasting at day of discharge. Last A1c was 13.2. Patient has been taking her home medications for her diabetes however her blood sugar levels remain poorly controlled. Patient is on insulin and Farxiga at home which were continued during this admission. She stated metformin causes her GI side effects and would like to avoid it. Discussed following up in clinic to better manage her diabetes and bringing her glucometer to her appointment.  ?   ?COPD ?Breathing has been stable and patient is on albuterol and Anovo-Ellipta at home. She is also on home oxygen, 2L in the evening, but does not use regularly as her machine has not been working. Her inhalers were continued. Pa

## 2022-02-10 NOTE — Care Management Obs Status (Signed)
MEDICARE OBSERVATION STATUS NOTIFICATION ? ? ?Patient Details  ?Name: [name] ?MRN: [id number] ?Date of Birth: 1951-09-27 ? ? ?Medicare Observation Status Notification Given:  Yes ? ? ? ?Zenon Mayo, RN ?02/10/2022, 4:39 PM ?

## 2022-02-10 NOTE — Progress Notes (Signed)
Mobility Specialist Progress Note: ? ? 02/10/22 1135  ?Mobility  ?Activity Ambulated with assistance in hallway  ?Level of Assistance Independent after set-up  ?Assistive Device None  ?Distance Ambulated (ft) 470 ft  ?Activity Response Tolerated well  ?$Mobility charge 1 Mobility  ? ?Pt agreeable to mobility session this am. Required no physical assistance throughout. Pt desat to 87% on RA upon exertion, requiring 2LO2 to maintain >90%. Pt back sitting EOB with all needs met. Ambulatory sat note to follow.  ? ?[name] ?Acute Rehab ?Phone: [phone] ?Office Phone: [phone] ? ?

## 2022-02-10 NOTE — TOC Transition Note (Signed)
Transition of Care (TOC) - CM/SW Discharge Note ? ? ?Patient Details  ?Name: [name] ?MRN: [id number] ?Date of Birth: 07-Jun-1951 ? ?Transition of Care (TOC) CM/SW Contact:  ?Zenon Mayo, RN ?Phone Number: ?02/10/2022, 4:32 PM ? ? ?Clinical Narrative:    ?Patient is for dc today, she states she has home oxygen with [employer] but the concentrator does not work. She also needs a tank to go home with.  NCM informed Zach with [employer] , he will have a tank brought up to room and work on switching out the concentrator for a another one.  ? ? ?Final next level of care: Home/Self Care ?Barriers to Discharge: No Barriers Identified ? ? ?Patient Goals and CMS Choice ?Patient states their goals for this hospitalization and ongoing recovery are:: return home ?  ?Choice offered to / list presented to : NA ? ?Discharge Placement ?  ?           ?  ?  ?  ?  ? ?Discharge Plan and Services ?  ?  ?           ?DME Arranged: Oxygen ?DME Agency: [employer] ?Date DME Agency Contacted: 02/10/22 ?Time DME Agency Contacted: [phone] ?Representative spoke with at DME Agency: Thedore Mins ?HH Arranged: NA ?  ?  ?  ?  ? ?Social Determinants of Health (SDOH) Interventions ?  ? ? ?Readmission Risk Interventions ?   ? View : No data to display.  ?  ?  ?  ? ? ? ? ? ?

## 2022-02-10 NOTE — Progress Notes (Signed)
Pt A/O x 4. OOB w/ steady gait. Admitted for CHF. See note. Denies pain and SOB. VSS. Pt on home 02. New 02 tanks delivered to beside. Education provided on meds and follow up care.  ?

## 2022-02-10 NOTE — Discharge Instructions (Addendum)
It was a pleasure taking care of you!  You presented to the ED with worsening shortness of breath and difficulty lying flat.  Your symptoms were consistent with a CHF exacerbation.  You stated you stop taking some of your medications as you had a cold and was taken with cold medications.  This is most likely was the cause of your CHF exacerbation.  I would advise you to take your medications as directed by your doctor and if you need to stop taking your medications for any reason call your clinic for advice.  We gave you IV Lasix and responded well to that.  We will discharge you with your home medications and close follow-up with your primary care doctor.  Please continue taking your medications and follow-up at the clinic. Start taking the Lasix tomorrow since we gave you IV lasix today  Please take a log of your sugars including a fasting sugar to your clinic visit. ? ?

## 2022-02-10 NOTE — Progress Notes (Signed)
SATURATION QUALIFICATIONS: (This note is used to comply with regulatory documentation for home oxygen) ? ?Patient Saturations on Room Air at Rest = 97% ? ?Patient Saturations on Room Air while Ambulating = 87% ? ?Patient Saturations on 2 Liters of oxygen while Ambulating = 91% ? ?Please briefly explain why patient needs home oxygen: ? ?[name] ?Acute Rehab ?Phone: [phone] ?Office Phone: [phone] ? ?

## 2022-02-14 ENCOUNTER — Other Ambulatory Visit: Payer: Self-pay | Admitting: Emergency Medicine

## 2022-02-14 DIAGNOSIS — Z1231 Encounter for screening mammogram for malignant neoplasm of breast: Secondary | ICD-10-CM

## 2022-02-15 ENCOUNTER — Encounter: Payer: Self-pay | Admitting: Internal Medicine

## 2022-02-15 ENCOUNTER — Other Ambulatory Visit: Payer: Self-pay

## 2022-02-15 ENCOUNTER — Ambulatory Visit (INDEPENDENT_AMBULATORY_CARE_PROVIDER_SITE_OTHER): Payer: Medicare Other | Admitting: Internal Medicine

## 2022-02-15 VITALS — BP 144/75 | HR 71 | Temp 97.8°F | Ht 62.0 in | Wt 193.1 lb

## 2022-02-15 DIAGNOSIS — I5043 Acute on chronic combined systolic (congestive) and diastolic (congestive) heart failure: Secondary | ICD-10-CM

## 2022-02-15 DIAGNOSIS — J984 Other disorders of lung: Secondary | ICD-10-CM

## 2022-02-15 DIAGNOSIS — E1142 Type 2 diabetes mellitus with diabetic polyneuropathy: Secondary | ICD-10-CM

## 2022-02-15 DIAGNOSIS — Z7984 Long term (current) use of oral hypoglycemic drugs: Secondary | ICD-10-CM

## 2022-02-15 DIAGNOSIS — F17210 Nicotine dependence, cigarettes, uncomplicated: Secondary | ICD-10-CM | POA: Diagnosis not present

## 2022-02-15 DIAGNOSIS — Z794 Long term (current) use of insulin: Secondary | ICD-10-CM

## 2022-02-15 LAB — POCT GLYCOSYLATED HEMOGLOBIN (HGB A1C): Hemoglobin A1C: 10.7 % — AB (ref 4.0–5.6)

## 2022-02-15 LAB — GLUCOSE, CAPILLARY: Glucose-Capillary: 237 mg/dL — ABNORMAL HIGH (ref 70–99)

## 2022-02-15 NOTE — Patient Instructions (Signed)
Ms. Ayars, ? ?It was very nice to see you in clinic today. Here are a few reminders from what we discussed: ? ?We are glad you are not having symptoms of heart failure. Because you were recently in the hospital, we will be checking a BMP and magnesium level. ?Your blood sugars have been high! Because there does not appear to be a consistent pattern, we will need to refer you to [facility] for placement of a continuous glucose monitor. This will help [place] determine how we should adjust your medicines. ?We will check your A1c today because it has almost been 3 months. This will also be helpful as we decide on medication adjustments. ? ?We would like for you to follow-up again in 3 months. If you have any questions or concerns before your next appointment, please do not hesitate to contact us. ?

## 2022-02-16 LAB — BMP8+ANION GAP
Anion Gap: 17 mmol/L (ref 10.0–18.0)
BUN/Creatinine Ratio: 19 (ref 12–28)
BUN: 19 mg/dL (ref 8–27)
CO2: 24 mmol/L (ref 20–29)
Calcium: 9.6 mg/dL (ref 8.7–10.3)
Chloride: 100 mmol/L (ref 96–106)
Creatinine, Ser: 1 mg/dL (ref 0.57–1.00)
Glucose: 235 mg/dL — ABNORMAL HIGH (ref 70–99)
Potassium: 4.3 mmol/L (ref 3.5–5.2)
Sodium: 141 mmol/L (ref 134–144)
eGFR: 61 mL/min/{1.73_m2} (ref >59)

## 2022-02-16 LAB — MAGNESIUM: Magnesium: 2 mg/dL (ref 1.6–2.3)

## 2022-02-17 NOTE — Assessment & Plan Note (Signed)
Patient presents to the clinic for a hospital follow-up for congestive heart failure. She was appropriately diuresed and was discharged on her home 2 L of supplemental oxygen for her ILD.  Patient stated she has been doing well since her hospital discharge.  No new orthopnea, dyspnea, or weight gain that she has noticed.  She states that she has not had to increase her supplemental oxygen since her recent hospitalization.  A/P: Patient presents today for HFU for acute on chronic CHF exacerbation.  On examination, she is saturating at 94% on room air, not requiring her 2 L baseline oxygen.  No appreciable JVD, or lower extremity edema.  She is 193 pounds, from 192 pounds and 10 ounces on her discharge date.  We will continue her GDMT regimen. - Continue Farxiga 10 mg - Continue furosemide 40 mg twice daily - Continue metoprolol 50 mg daily - Continue Entresto 49-51 mg twice daily - Continue Aldactone 12.5 mg daily

## 2022-02-17 NOTE — Progress Notes (Signed)
Internal Medicine Clinic Attending ? ?Case discussed with Dr. Winters  At the time of the visit.  We reviewed the resident?s history and exam and pertinent patient test results.  I agree with the assessment, diagnosis, and plan of care documented in the resident?s note.  ?

## 2022-02-17 NOTE — Addendum Note (Signed)
Addended by: Aldine Contes on: 02/17/2022 09:33 PM   Modules accepted: Level of Service

## 2022-02-17 NOTE — Assessment & Plan Note (Signed)
Patient with a history of RLD presents to the clinic after hospital follow-up.  She is doing well on her Anoro Ellipta inhaler.  She states that she has not had to increase her baseline 2 L oxygen, and is actually able to stay off of it at rest.  She has not had to use her albuterol inhaler.  Lung exam is clear to auscultation bilaterally. - Continue on oral Ellipta - Continue albuterol inhaler as needed as needed for wheezing

## 2022-02-17 NOTE — Progress Notes (Signed)
CC: HFU CHF  HPI:  [name] is a 71 y.o. person, with a PMH noted below, who presents to the clinic HFU for CHF. To see the management of their acute and chronic conditions, please see the A&P note under the Encounters tab.   Past Medical History:  Diagnosis Date   ATN (acute tubular necrosis) ([place]) 07/15/2014   Automatic implantable cardioverter-defibrillator in situ    Automatic implantable cardioverter-defibrillator in situ 05/04/2007   Qualifier: Diagnosis of  By: Isla Pence     Cardiac defibrillator in situ    Atlas II VR ([facility]) implanted by Dr Lovena Le   CHF (congestive heart failure) ([place])    Chronic combined systolic and diastolic heart failure ([place])    a. EF 35-40% in past;  b. Echo 7/13:  EF 45-50%, Gr 2 diast dysfn, mild AI, mild MAC, trivial MR, mild LAE, PASP 47.   Chronic ulcer of leg ([place])    04-09-15 resolved-not a problem.   Colon polyps 04/12/2013   Rectosigmoid polyp   COPD (chronic obstructive pulmonary disease) (H[facility])    O2 at night   Depression    Diabetes mellitus    Diabetes mellitus, type 2 ([place]) 04/03/2012   HIGH RISK FEET.. Please have patient take shoes and socks off every visit for visual foot inspection.     Eczema    Elevated alkaline phosphatase level    GGT and 5'nucleotidase 8/13 normal   Health care maintenance 01/22/2013   Surgically absent cervix- no pap needed (Path report 07/2000: uterine body with attached bilateral  adnexa and separate cervix.)   History of oxygen administration    oxygen @ 2 l/m nasally bedtime 24/7   HTN (hypertension)    Hx of cardiac cath    a. [place] 2003 normal;  b. [facility] 6/13:  Mild calcification in the LM, o/w normal coronary arteries, EF 45%.    Hyperkalemia 08/08/2017   Hyperlipidemia    Elevated triglyceride in 2019   Hyperlipidemia associated with type 2 diabetes mellitus ([place]) 05/25/2007   Qualifier: Diagnosis of  By: Hassell Done FNP, [place]     Hyperthyroidism, subclinical    HYPERTHYROIDISM, SUBCLINICAL  05/06/2009   Qualifier: Diagnosis of  By: Darrick Meigs     Implantable cardioverter-defibrillator (ICD) generator end of life    Lipoma    Need for hepatitis C screening test 04/25/2019   NICM (nonischemic cardiomyopathy) ([name])    Obesity    On home oxygen therapy    "2L; 24/7" (10/11/2016)   Post menopausal syndrome    Shortness of breath 07/14/2017   Skin rash 07/12/2018   Sleep apnea    pt denies 04/12/2013   Review of Systems:   Review of Systems  Constitutional:  Negative for chills, fever, malaise/fatigue and weight loss.  Cardiovascular:  Negative for chest pain, palpitations, orthopnea, claudication and leg swelling.  Gastrointestinal:  Negative for abdominal pain, diarrhea, nausea and vomiting.  Genitourinary:  Negative for dysuria, frequency and urgency.    Physical Exam:  Vitals:   02/15/22 1005  BP: (!) 144/75  Pulse: 71  Temp: 97.8 F (36.6 C)  TempSrc: Oral  SpO2: 94%  Weight: 193 lb 1.6 oz (87.6 kg)  Height: _0  (1.575 m)   Physical Exam Constitutional:      General: She is not in acute distress.    Appearance: She is not ill-appearing, toxic-appearing or diaphoretic.     Comments: Resting comfortably in chair, answers questions appropriately  HENT:  Head: Normocephalic and atraumatic.  Cardiovascular:     Rate and Rhythm: Normal rate and regular rhythm.     Pulses: Normal pulses.     Heart sounds: Normal heart sounds. No murmur heard.   No friction rub. No gallop.     Comments: No JVD Pulmonary:     Effort: Pulmonary effort is normal. No respiratory distress.     Breath sounds: Normal breath sounds. No wheezing or rales.     Comments: Saturating at 94% on RA Abdominal:     General: Abdomen is flat. Bowel sounds are normal. There is no distension.     Palpations: Abdomen is soft.     Tenderness: There is no abdominal tenderness. There is no guarding.  Musculoskeletal:     Right lower leg: No edema.     Left lower leg: No edema.  Skin:     General: Skin is warm.     Findings: No erythema or rash.  Psychiatric:        Mood and Affect: Mood normal.        Behavior: Behavior normal.     Assessment & Plan:   See Encounters Tab for problem based charting.  Patient discussed with Dr. Dareen Piano  No problem-specific Assessment & Plan notes found for this encounter.

## 2022-02-17 NOTE — Assessment & Plan Note (Addendum)
Patient with a last A1c of 13.2 presents today for evaluation of her uncontrolled diabetes.  POC A1c today is 10.7, she states that her blood sugars typically stay in the 200s, but she has had some low sugars as low as 88-96, as high as into the 300s.  Her morning glucose readings appear to be in the low 100s, and her afternoon appears to be elevated to 300s, unfortunately glucose readings are sporadic.  I believe that she will be a good candidate for continuous glucose monitor to further evaluate her diabetes and make appropriate medication changes.  We will continue current dose, and have patient seen by diabetes coordinator for CGM. - Continue degludec 22 units daily - Continue Humalog 6 units 3 times daily with meals - Continue farxiga 10 mg daily - Continue Victoza 1.8 daily - Referral to diabetes coordinator for CGM

## 2022-02-18 ENCOUNTER — Encounter: Payer: Medicare Other | Admitting: Internal Medicine

## 2022-03-03 NOTE — Progress Notes (Unsigned)
Cardiology Office Note Date:  03/04/2022  Patient ID:  [name], [name] [birth year], MRN [id number] PCP:  Maudie Mercury, MD  Cardiologist:  Dr. Stanford Breed Electrophysiologist: Dr. Lovena Le   Chief Complaint:   annual device visit  History of Present Illness: [name] is a 71 y.o. female with history of combined CHF, NICM w/ICD, HTN, HLD, COPD uses O2 at [facility], is an ongoing smoker, obesity, and DM.    She comes in today to be seen for Dr. Lovena Le, last seen by him Feb 2021, she was working on reducing smoking, denied symptoms.  Discussed class II symptoms, no changes were made.  She had a HF hospitalization Sept 2022, established with HF team. Last saw them Nov 2022 by Rudi Heap, NP.  She was doing OK, wearing O2 3L at HS.  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 Volume stable, discussed importance of sodium restriction.  She had an appt with Dr. Haroldine Laws in place, though doesn't look like she made it  She was hospitalized briefly 02/09/22 - 02/10/22 with progressive SOB, CHF and COVID, managed with IM team.  Diuresed and discharged with home O2 (already had), no particular tx of COVID.  TODAY She continues to feel much better. Denies any ongoing SOB, no DOE with her ADLs. No CP, palpitations or cardiac awareness No near syncope or syncope.  She continues with dietary indiscretions, though eating fast food less often down to 3 days/week and switched from [employer] to [employer] because they grill their meat. Her CorVue today trending down, she admits to not taking her lasix BID a few times a week as well.   Device information: SJM single chamber ICD, implanted 04/02/14, Dr. Lovena Le   Past Medical History:  Diagnosis Date   ATN (acute tubular necrosis) ([place]) 07/15/2014   Automatic implantable cardioverter-defibrillator in situ    Automatic implantable cardioverter-defibrillator in situ 05/04/2007   Qualifier:  Diagnosis of  By: Isla Pence     Cardiac defibrillator in situ    Atlas II VR (SJM) implanted by Dr Lovena Le   CHF (congestive heart failure) ([place])    Chronic combined systolic and diastolic heart failure ([place])    a. EF 35-40% in past;  b. Echo 7/13:  EF 45-50%, Gr 2 diast dysfn, mild AI, mild MAC, trivial MR, mild LAE, PASP 47.   Chronic ulcer of leg ([place])    04-09-15 resolved-not a problem.   Colon polyps 04/12/2013   Rectosigmoid polyp   COPD (chronic obstructive pulmonary disease) (HCC)    O2 at night   Depression    Diabetes mellitus    Diabetes mellitus, type 2 ([place]) 04/03/2012   HIGH RISK FEET.. Please have patient take shoes and socks off every visit for visual foot inspection.     Eczema    Elevated alkaline phosphatase level    GGT and 5'nucleotidase 8/13 normal   Health care maintenance 01/22/2013   Surgically absent cervix- no pap needed (Path report 07/2000: uterine body with attached bilateral  adnexa and separate cervix.)   History of oxygen administration    oxygen @ 2 l/m nasally bedtime 24/7   HTN (hypertension)    Hx of cardiac cath    a. [place] 2003 normal;  b. LHC 6/13:  Mild calcification in the LM, o/w normal coronary arteries, EF 45%.    Hyperkalemia 08/08/2017   Hyperlipidemia    Elevated triglyceride in 2019   Hyperlipidemia associated with type  2 diabetes mellitus ([place]) 05/25/2007   Qualifier: Diagnosis of  By: Hassell Done FNP, [place]     Hyperthyroidism, subclinical    HYPERTHYROIDISM, SUBCLINICAL 05/06/2009   Qualifier: Diagnosis of  By: Darrick Meigs     Implantable cardioverter-defibrillator (ICD) generator end of life    Lipoma    Need for hepatitis C screening test 04/25/2019   NICM (nonischemic cardiomyopathy) ([place])    Obesity    On home oxygen therapy    "2L; 24/7" (10/11/2016)   Post menopausal syndrome    Shortness of breath 07/14/2017   Skin rash 07/12/2018   Sleep apnea    pt denies 04/12/2013    Past Surgical History:  Procedure Laterality  Date   ABDOMINAL HYSTERECTOMY     CARDIAC CATHETERIZATION     CARDIAC CATHETERIZATION N/A 06/21/2016   Procedure: Left Heart Cath and Coronary Angiography;  Surgeon: Sherren Mocha, MD;  Location: [facility];  Service: Cardiovascular;  Laterality: N/A;   CARDIAC DEFIBRILLATOR PLACEMENT  05/04/2007   SJM Atlas II VR ICD   CARDIAC DEFIBRILLATOR PLACEMENT     CATARACT EXTRACTION     od   COLONOSCOPY N/A 04/12/2013   Procedure: COLONOSCOPY;  Surgeon: Beryle Beams, MD;  Location: [facility];  Service: Endoscopy;  Laterality: N/A;  pt.has defibrilator   COLONOSCOPY N/A 04/16/2015   Procedure: COLONOSCOPY;  Surgeon: [name], MD;  Location: [facility];  Service: Endoscopy;  Laterality: N/A;   ESOPHAGOGASTRODUODENOSCOPY N/A 04/16/2015   Procedure: ESOPHAGOGASTRODUODENOSCOPY (EGD);  Surgeon: [name], MD;  Location: [facility];  Service: Endoscopy;  Laterality: N/A;   EYE SURGERY     HERNIA REPAIR     HYSTEROSCOPY     IMPLANTABLE CARDIOVERTER DEFIBRILLATOR (ICD) GENERATOR CHANGE N/A 04/02/2014   Procedure: ICD GENERATOR CHANGE;  Surgeon: Evans Lance, MD;  Location: [facility] CATH LAB;  Service: Cardiovascular;  Laterality: N/A;   INSERT / REPLACE / REMOVE PACEMAKER     LEFT HEART CATHETERIZATION WITH CORONARY ANGIOGRAM N/A 04/02/2012   Procedure: LEFT HEART CATHETERIZATION WITH CORONARY ANGIOGRAM;  Surgeon: Hillary Bow, MD;  Location: [facility] CATH LAB;  Service: Cardiovascular;  Laterality: N/A;   ORIF ANKLE FRACTURE Right 08/12/2019   Procedure: OPEN REDUCTION INTERNAL FIXATION (ORIF) RIGHT TRIMALLEOLAR ANKLE FRACTURE;  Surgeon: Leandrew Koyanagi, MD;  Location: [facility];  Service: Orthopedics;  Laterality: Right;   TONSILLECTOMY     TUBAL LIGATION      Current Outpatient Medications  Medication Sig Dispense Refill   albuterol (VENTOLIN HFA) 108 (90 Base) MCG/ACT inhaler Inhale 2 puffs into the lungs every 6 (six) hours as needed for wheezing or shortness of breath. 8 g 2   aspirin  EC 81 MG tablet Take 81 mg by mouth daily. Swallow whole.     atorvastatin (LIPITOR) 80 MG tablet Take 1 tablet (80 mg total) by mouth at bedtime. 90 tablet 3   Blood Glucose Monitoring Suppl (ACCU-CHEK GUIDE ME) w/Device KIT by Does not apply route.     calcium-vitamin D (OSCAL WITH D) 500-200 MG-UNIT tablet Take 1 tablet by mouth 2 (two) times daily. 90 tablet 6   clobetasol cream (TEMOVATE) 9.39 % Apply 1 application. topically 2 (two) times daily.     dapagliflozin propanediol (FARXIGA) 10 MG TABS tablet Take 1 tablet (10 mg total) by mouth daily. 30 tablet 2   furosemide (LASIX) 40 MG tablet Take 1 tablet (40 mg total) by mouth 2 (two) times daily. 180 tablet 3   glucose  blood (TRUE FOCUS BLOOD GLUCOSE STRIP) test strip Use to check blood sugar 3 times a day 100 each 12   insulin degludec (TRESIBA) 100 UNIT/ML FlexTouch Pen Inject 22 Units into the skin daily. 6 mL 2   Insulin lispro (HUMALOG JUNIOR KWIKPEN) 100 UNIT/ML Inject 6 Units into the skin 3 (three) times daily. 6 mL 0   Insulin Pen Needle (PEN NEEDLES) 32G X 4 MM MISC Use to inject insulin 3 times times per day 100 each 3   Lancets Misc. (ACCU-CHEK FASTCLIX LANCET) KIT Lancing device, use with lancets for blood glucose monitoring. 1 kit 0   Lancets MISC 1 Act by Does not apply route 4 (four) times daily. 200 each 3   liraglutide (VICTOZA) 18 MG/3ML SOPN Inject 1.43m daily into the skin 30 mL 3   metoprolol succinate (TOPROL-XL) 50 MG 24 hr tablet TAKE 1 TABLET(50 MG) BY MOUTH DAILY WITH OR IMMEDIATELY FOLLOWING A MEAL (Patient taking differently: Take 50 mg by mouth daily.) 90 tablet 3   sacubitril-valsartan (ENTRESTO) 49-51 MG Take 1 tablet by mouth 2 (two) times daily. 60 tablet 11   spironolactone (ALDACTONE) 25 MG tablet Take 0.5 tablets (12.5 mg total) by mouth daily. 15 tablet 3   triamcinolone cream (KENALOG) 0.1 % Apply 1 application. topically 2 (two) times daily.     umeclidinium-vilanterol (ANORO ELLIPTA) 62.5-25 MCG/ACT  AEPB Inhale 1 puff into the lungs daily. 60 each 2   No current facility-administered medications for this visit.    Allergies:   Actos [pioglitazone], Naproxen, Rosiglitazone, Tomato, Hydrocodone-acetaminophen, and Hydrocortisone   Social History:  The patient  reports that she has been smoking cigarettes. She has a 22.50 pack-year smoking history. She has never used smokeless tobacco. She reports that she does not currently use alcohol after a past usage of about 3.0 standard drinks per week. She reports that she does not use drugs.   Family History:  The patient's family history includes Asthma in her grandchild, maternal aunt, and maternal uncle; Breast cancer in her cousin and daughter; Diabetes in her sister; Heart disease in her father, maternal aunt, maternal grandfather, maternal uncle, paternal aunt, and paternal uncle; Seizures in her father; Stroke in her mother.  ROS:  Please see the history of present illness.  All other systems are reviewed and otherwise negative.   PHYSICAL EXAM:  VS:  BP (!) 152/84   Pulse 82   Ht _0  (1.575 m)   Wt 192 lb (87.1 kg)   SpO2 91%   BMI 35.12 kg/m  BMI: Body mass index is 35.12 kg/m. Well nourished, well developed, in no acute distress  HEENT: normocephalic, atraumatic  Neck: no JVD, carotid bruits or masses Cardiac:  RRR; no significant murmurs, no rubs, or gallops Lungs: CTA b/l, no wheezing, rhonchi or rales  Abd: soft, nontender MS: no deformity or atrophy Ext: trace edema b/l R>L (chronically she states 2/2 an old R ankle fracture) Skin: warm and dry, no rash Neuro:  No gross deficits appreciated Psych: euthymic mood, full affect  ICD site is stable, no tethering or discomfort   EKG:  not done today  ICD interrogation done today and reviewed by myself  Battery and lead measurements are good No arrhythmias VP <1% CorVue wobbles about threshold, trending downward currently  06/16/2021: TTE  1. Left ventricular ejection  fraction, by estimation, is 20 to 25%. Left  ventricular ejection fraction by PLAX is 23 %. The left ventricle has  severely decreased function. The  left ventricle demonstrates global  hypokinesis. The left ventricular internal   cavity size was mildly dilated. Left ventricular diastolic parameters are  consistent with Grade II diastolic dysfunction (pseudonormalization).  Elevated left ventricular end-diastolic pressure.   2. Right ventricular systolic function is normal. The right ventricular  size is normal. There is severely elevated pulmonary artery systolic  pressure.   3. Left atrial size was severely dilated.   4. The mitral valve is degenerative. Trivial mitral valve regurgitation.  No evidence of mitral stenosis.   5. The aortic valve is tricuspid. There is mild calcification of the  aortic valve. There is mild thickening of the aortic valve. Aortic valve  regurgitation is mild. No aortic stenosis is present. Aortic regurgitation  PHT measures 430 msec.   6. Aortic dilatation noted. There is mild dilatation of the ascending  aorta, measuring 37 mm.   7. The inferior vena cava is dilated in size with <50% respiratory  variability, suggesting right atrial pressure of 15 mmHg.   Prior cardiac testing: LHC 6/13: Mild calcification in the LM, o/w normal coronary arteries, EF 45%.  Admitted 5/17 with encephalopathy and CHF; also with acute renal insuff; improved with therapy. TTE 5/17 showed EF 20-25 (decreased from previous), grade 1 diastolic dysfunction, mildly increased pulmonary pressure. Nuclear study 6/17 showed EF 33, anterolateral ischemia and septal scar/ischemia 06/1916: LHC with angiographically normal coronaries, LVEDP 40mhg  Recent Labs: 06/17/2021: ALT 35 02/09/2022: B Natriuretic Peptide 122.9; Hemoglobin 14.3; Platelets 213 02/15/2022: BUN 19; Creatinine, Ser 1.00; Magnesium 2.0; Potassium 4.3; Sodium 141  11/23/2021: Chol/HDL Ratio 2.8; Cholesterol, Total 205; HDL  72; LDL Chol Calc (NIH) 117; Triglycerides 93   Estimated Creatinine Clearance: 55.2 mL/min (by C-G formula based on SCr of 1 mg/dL).   Wt Readings from Last 3 Encounters:  03/04/22 192 lb (87.1 kg)  02/15/22 193 lb 1.6 oz (87.6 kg)  02/10/22 192 lb 10.9 oz (87.4 kg)     Other studies reviewed: Additional studies/records reviewed today include: summarized above  ASSESSMENT AND PLAN:  1. ICD     normal device function, no changes made      She does not want to do remote device checks  2. NICM 3. combined CHF     Exam appears compensated, lungs clear, weight down a couple pounds.     CorVue suggests she is getting some volume  We discussed at length that she needs to avoid fast food restaurants and sodium as well as taking her meds as prescribed She is over due to see HF team  4. HTN     She will monitor at home, should improve with regular BID lasix      Was lower at her last couple outpatient vitals     Disposition: she is due to see HF clinic team, we will see her back in [place] if needed for device/EP  Current medicines are reviewed at length with the patient today.  The patient did not have any concerns regarding medicines.  SiHaywood LassoPA-C 03/04/2022 12:43 PM     [place] [phone])  [phone])

## 2022-03-04 ENCOUNTER — Encounter: Payer: Self-pay | Admitting: Physician Assistant

## 2022-03-04 ENCOUNTER — Ambulatory Visit (INDEPENDENT_AMBULATORY_CARE_PROVIDER_SITE_OTHER): Payer: Medicare Other | Admitting: Physician Assistant

## 2022-03-04 VITALS — BP 152/84 | HR 82 | Ht 62.0 in | Wt 192.0 lb

## 2022-03-04 DIAGNOSIS — Z95810 Presence of automatic (implantable) cardiac defibrillator: Secondary | ICD-10-CM

## 2022-03-04 DIAGNOSIS — I428 Other cardiomyopathies: Secondary | ICD-10-CM | POA: Diagnosis not present

## 2022-03-04 DIAGNOSIS — I10 Essential (primary) hypertension: Secondary | ICD-10-CM | POA: Diagnosis not present

## 2022-03-04 DIAGNOSIS — I5042 Chronic combined systolic (congestive) and diastolic (congestive) heart failure: Secondary | ICD-10-CM

## 2022-03-04 LAB — CUP PACEART INCLINIC DEVICE CHECK
Date Time Interrogation Session: 20230602125459
Implantable Lead Implant Date: 20080801
Implantable Lead Location: 753860
Implantable Lead Model: 7120
Implantable Pulse Generator Implant Date: 20150701
Pulse Gen Serial Number: 7206538

## 2022-03-04 NOTE — Patient Instructions (Addendum)
Medication Instructions:   Your physician recommends that you continue on your current medications as directed. Please refer to the Current Medication list given to you today.  *If you need a refill on your cardiac medications before your next appointment, please call your pharmacy*   Lab Work: [place]    If you have labs (blood work) drawn today and your tests are completely normal, you will receive your results only by: [place] (if you have MyChart) OR A paper copy in the mail If you have any lab test that is abnormal or we need to change your treatment, we will call you to review the results.   Testing/Procedures: NONE ORDERED  TODAY    Follow-Up: At [facility], you and your health needs are our priority.  As part of our continuing mission to provide you with exceptional heart care, we have created designated Provider Care Teams.  These Care Teams include your primary Cardiologist (physician) and Advanced Practice Providers (APPs -  Physician Assistants and Nurse Practitioners) who all work together to provide you with the care you need, when you need it.  We recommend signing up for the patient portal called "MyChart".  Sign up information is provided on this After Visit Summary.  MyChart is used to connect with patients for Virtual Visits (Telemedicine).  Patients are able to view lab/test results, encounter notes, upcoming appointments, etc.  Non-urgent messages can be sent to your provider as well.   To learn more about what you can do with MyChart, go to [email or web address].    Your next appointment:  Dr. Haroldine Laws [facility] next available  over due appointment  6 month(s)  The format for your next appointment:   In Person  Provider:   You may see Dr. Lovena Le  or one of the following Advanced Practice Providers on your designated Care Team:   Tommye Standard, PA-C   Other Instructions   Important Information About Sugar

## 2022-03-15 ENCOUNTER — Ambulatory Visit (INDEPENDENT_AMBULATORY_CARE_PROVIDER_SITE_OTHER): Payer: Medicare Other | Admitting: Dietician

## 2022-03-15 ENCOUNTER — Encounter: Payer: Self-pay | Admitting: Dietician

## 2022-03-15 ENCOUNTER — Other Ambulatory Visit: Payer: Self-pay | Admitting: Dietician

## 2022-03-15 DIAGNOSIS — E1142 Type 2 diabetes mellitus with diabetic polyneuropathy: Secondary | ICD-10-CM

## 2022-03-15 NOTE — Patient Instructions (Addendum)
I will ask your doctor to order two things- -  Dexcom receiver -  Dexcom sensors  Please pick these up and Call me when you get your Continuous glucose monitoring(CGM).  Please carry a Humalog pen in your purse with  you with pen needles to take before you eat lunch  [place] [phone]

## 2022-03-15 NOTE — Progress Notes (Signed)
Diabetes Self-Management Education  Visit Type: First/Initial  Appt. Start Time: 930 Appt. End Time: 0263  03/15/2022  [name], identified by name and date of birth, is a 71 y.o. female with a diagnosis of Diabetes: Type 2.   ASSESSMENT   She elected not to weight today. She reports gradual weight loss with which she is happy. She mentions several times that she eats "too many sweets".  Wt Readings from Last 10 Encounters:  03/04/22 192 lb (87.1 kg)  02/15/22 193 lb 1.6 oz (87.6 kg)  02/10/22 192 lb 10.9 oz (87.4 kg)  01/21/22 197 lb 11.2 oz (89.7 kg)  12/21/21 199 lb 14.4 oz (90.7 kg)  11/23/21 199 lb 14.4 oz (90.7 kg)  08/06/21 200 lb 6.4 oz (90.9 kg)  07/15/21 202 lb 3.2 oz (91.7 kg)  06/25/21 197 lb 12.8 oz (89.7 kg)  06/24/21 200 lb 8 oz (90.9 kg)   Her goal for her blood sugars is:  110-120 to 180 mg/dL Her meter download showed 1.2 checks [er day at random times, average for 90 days is 225, range is 76 to 527. No lows in past 2 months, only 4/30 readings in target range.  Her Self monitoring is inadequate to surmise any patterns..  Lab Results  Component Value Date   HGBA1C 10.7 (A) 02/15/2022   HGBA1C 13.2 (A) 11/23/2021   HGBA1C 9.4 (A) 05/24/2021   HGBA1C 12.0 (A) 02/04/2021   HGBA1C 9.1 (A) 07/27/2020       Diabetes Self-Management Education - 03/15/22 1300       Visit Information   Visit Type First/Initial      Initial Visit   Diabetes Type Type 2    Are you currently following a meal plan? No    Are you taking your medications as prescribed? No      Health Coping   How would you rate your overall health? Good      Psychosocial Assessment   Patient Belief/Attitude about Diabetes Motivated to manage diabetes    What is the hardest part about your diabetes right now, causing you the most concern, or is the most worrisome to you about your diabetes?   --   will assess at future visit   Self-care barriers Lack of material resources     Self-management support Family;Doctor's office;CDE visits    Patient Concerns Glycemic Control    Special Needs None    Preferred Learning Style No preference indicated    Learning Readiness Ready    How often do you need to have someone help you when you read instructions, pamphlets, or other written materials from your doctor or pharmacy? 3 - Sometimes    What is the last grade level you completed in school? 12      Pre-Education Assessment   Patient understands the diabetes disease and treatment process. Comprehends key points    Patient understands incorporating nutritional management into lifestyle. Comprehends key points    Patient undertands incorporating physical activity into lifestyle. Comprehends key points    Patient understands using medications safely. Comprehends key points    Patient understands monitoring blood glucose, interpreting and using results Needs Review    Patient understands prevention, detection, and treatment of acute complications. Needs Review    Patient understands prevention, detection, and treatment of chronic complications. Compreheands key points    Patient understands how to develop strategies to address psychosocial issues. Comprehends key points    Patient understands how to develop strategies to promote  health/change behavior. Comprehends key points      Complications   Last HgB A1C per patient/outside source 10 %    How often do you check your blood sugar? 1-2 times/day    Fasting Blood glucose range (mg/dL) 70-129;>200;130-179;180-200    Postprandial Blood glucose range (mg/dL) >200    Number of hypoglycemic episodes per month 0   had some low blood sugar 2 months ago but none this month   Number of hyperglycemic episodes ( >277m/dL): Daily    Can you tell when your blood sugar is high? Yes   she ha symptoms but does not recognize them   What do you do if your blood sugar is high? drinks water    Have you had a dilated eye exam in the past 12  months? Yes    Have you had a dental exam in the past 12 months? No   no teeth   Are you checking your feet? Yes    How many days per week are you checking your feet? 7      Dietary Intake   Breakfast 1015-1030 am- home cooked 1/2 to 3/4 c grits, egg, 12 ounces OJ w/ half water, 2 strips bacon, sometimes strawberreis or banana    Lunch 2-3 PM skips 3x/week. out 3x/week [employer] water, slushie or milkshake, [employer] rst beef and sometimes curlie fries, sluchie, BK- whoper JBrooke Bonito slushie    Snack (afternoon) apple , grapes    Dinner 6 PM skips 2x/week, other times boiled or bake chicken or pork chops, hamburger, corn, creamed pot, mac & cheese or greens beans, can regular soda    Snack (evening) 8 Pm while wathcing TV: cookiesm hershey bar, or yogurt or sf pudding or fruits    Beverage(s) water, OJ, soda both watered down, slushies, milkshaks      Activity / Exercise   Activity / Exercise Type ADL's;Light (walking / raking leaves)    How many days per week do you exercise? 4    How many minutes per day do you exercise? 30    Total minutes per week of exercise 120      Patient Education   Previous Diabetes Education Yes (please comment)   here   Healthy Eating Information on hints to eating out and maintain blood glucose control.;Meal timing in regards to the patients' current diabetes medication.    Monitoring Taught/evaluated CGM (comment)   she would like to get and start using the DExcom G7   Acute complications Discussed and identified patients' prevention, symptoms, and treatment of hyperglycemia.      Individualized Goals (developed by patient)   Medications take my medication as prescribed      Outcomes   Expected Outcomes Demonstrated interest in learning. Expect positive outcomes    Future DMSE Other (comment)   she is to call when she gets her CGM   Program Status Not Completed             Individualized Plan for Diabetes Self-Management Training:   Learning  Objective:  Patient will have a greater understanding of diabetes self-management. Patient education plan is to attend individual and/or group sessions per assessed needs and concerns.   Plan:   Patient Instructions  I will ask your doctor to order two things- -  Dexcom receiver -  Dexcom sensors  Please pick these up and Call me when you get your Continuous glucose monitoring(CGM).  Please carry on Humalog pen in your purse with  you  with pen needles to take before you eat lunch  [name] [phone]      Expected Outcomes:  Demonstrated interest in learning. Expect positive outcomes  Education material provided: Diabetes Resources  If problems or questions, patient to contact team via:  Phone  Future DSME appointment: Other (comment) (she is to call when she gets her CGM) Debera Lat, RD 03/15/2022 3:51 PM.

## 2022-03-15 NOTE — Telephone Encounter (Signed)
Patient would like to use personal Dexcom G7 Continuous glucose monitoring and request a prescription.

## 2022-03-17 MED ORDER — DEXCOM G7 SENSOR MISC: 3 refills | Status: DC

## 2022-03-17 MED ORDER — DEXCOM G7 RECEIVER DEVI: 0 refills | Status: DC

## 2022-03-21 ENCOUNTER — Ambulatory Visit
Admission: RE | Admit: 2022-03-21 | Discharge: 2022-03-21 | Disposition: A | Discharge status: Home / Self Care | Payer: Medicare Other | Source: Ambulatory Visit | Attending: Emergency Medicine | Admitting: Emergency Medicine

## 2022-03-21 DIAGNOSIS — Z1231 Encounter for screening mammogram for malignant neoplasm of breast: Secondary | ICD-10-CM

## 2022-03-23 ENCOUNTER — Telehealth: Payer: Self-pay

## 2022-03-23 NOTE — Telephone Encounter (Signed)
Pa for pt  ( [place] ) came hrough on cover my meds was submitted with last office notes and  labs .[name] Awaiting approval or denial ..[name]

## 2022-03-24 ENCOUNTER — Encounter: Payer: Self-pay | Admitting: Dietician

## 2022-03-24 NOTE — Telephone Encounter (Signed)
DECISION ( RECEIVER )     Outcome  Approved on June 21  Request Reference Number: [id number].   DEXCOM G7 MIS RECEIVER is approved through 10/02/2022.   Your patient may now fill this prescription and it will be covered.   Drug Dexcom G7 Receiver device   ( COPY SENT TO PHARMACY ALSO )

## 2022-03-26 ENCOUNTER — Encounter: Payer: Self-pay | Admitting: *Deleted

## 2022-04-01 ENCOUNTER — Telehealth (HOSPITAL_COMMUNITY): Payer: Self-pay

## 2022-04-01 NOTE — Telephone Encounter (Signed)
Called and left patient a message to confirm/remind patient of their appointment at the [facility] on 04/04/22.

## 2022-04-04 ENCOUNTER — Encounter (HOSPITAL_COMMUNITY): Payer: Medicare Other

## 2022-05-02 ENCOUNTER — Other Ambulatory Visit: Payer: Self-pay

## 2022-05-02 DIAGNOSIS — E1142 Type 2 diabetes mellitus with diabetic polyneuropathy: Secondary | ICD-10-CM

## 2022-05-02 DIAGNOSIS — I5042 Chronic combined systolic (congestive) and diastolic (congestive) heart failure: Secondary | ICD-10-CM

## 2022-05-02 MED ORDER — DAPAGLIFLOZIN PROPANEDIOL 10 MG PO TABS: 10.0000 mg | ORAL_TABLET | Freq: Every day | ORAL | 2 refills | Status: DC

## 2022-05-13 ENCOUNTER — Encounter (HOSPITAL_COMMUNITY): Payer: Self-pay | Admitting: Emergency Medicine

## 2022-05-13 ENCOUNTER — Ambulatory Visit (HOSPITAL_COMMUNITY)
Admission: EM | Admit: 2022-05-13 | Discharge: 2022-05-13 | Disposition: A | Discharge status: Home / Self Care | Payer: Medicare Other | Attending: Urgent Care | Admitting: Urgent Care

## 2022-05-13 DIAGNOSIS — R6883 Chills (without fever): Secondary | ICD-10-CM | POA: Diagnosis not present

## 2022-05-13 DIAGNOSIS — R6889 Other general symptoms and signs: Secondary | ICD-10-CM | POA: Diagnosis present

## 2022-05-13 LAB — CBC WITH DIFFERENTIAL/PLATELET
Abs Immature Granulocytes: 0.03 10*3/uL (ref 0.00–0.07)
Basophils Absolute: 0 10*3/uL (ref 0.0–0.1)
Basophils Relative: 1 %
Eosinophils Absolute: 0.5 10*3/uL (ref 0.0–0.5)
Eosinophils Relative: 7 %
HCT: 39.9 % (ref 36.0–46.0)
Hemoglobin: 12.3 g/dL (ref 12.0–15.0)
Immature Granulocytes: 0 %
Lymphocytes Relative: 17 %
Lymphs Abs: 1.3 10*3/uL (ref 0.7–4.0)
MCH: 26.2 pg (ref 26.0–34.0)
MCHC: 30.8 g/dL (ref 30.0–36.0)
MCV: 84.9 fL (ref 80.0–100.0)
Monocytes Absolute: 0.5 10*3/uL (ref 0.1–1.0)
Monocytes Relative: 7 %
Neutro Abs: 5.3 10*3/uL (ref 1.7–7.7)
Neutrophils Relative %: 68 %
Platelets: 236 10*3/uL (ref 150–400)
RBC: 4.7 MIL/uL (ref 3.87–5.11)
RDW: 16.8 % — ABNORMAL HIGH (ref 11.5–15.5)
WBC: 7.7 10*3/uL (ref 4.0–10.5)
nRBC: 0 % (ref 0.0–0.2)

## 2022-05-13 LAB — TSH: TSH: 1.701 u[IU]/mL (ref 0.350–4.500)

## 2022-05-13 NOTE — ED Provider Notes (Signed)
[place]    CSN: [id number] Arrival date & time: 05/13/22  1239      History   Chief Complaint Chief Complaint  Patient presents with   Chills    HPI [name] is a 71 y.o. female.   Pleasant 71 year old female presents today due to concerns of feeling cold for the past week and a half.  She states she will intermittently get a chill, but denies shaking.  She denies rigors.  She denies symptoms of infection.  She denies abnormal fatigue or lethargy.  She denies change in bowel movements or appetite.  She has a significant cardiovascular history and uncontrolled diabetes, but states these are at baseline.  She did just start a medication for atopic dermatitis, Dupixent, but states that her symptoms predated her first injection.  She denies COVID-like symptoms, URI symptoms, cough, shortness of breath, rash apart from her normal dermatitis, or any changes to her health.  She primarily came in today requesting an anemia and thyroid screen. Admits that blood sugars are not controlled, but feels at her baseline here.      Past Medical History:  Diagnosis Date   ATN (acute tubular necrosis) ([place]) 07/15/2014   Automatic implantable cardioverter-defibrillator in situ    Automatic implantable cardioverter-defibrillator in situ 05/04/2007   Qualifier: Diagnosis of  By: Isla Pence     Cardiac defibrillator in situ    Atlas II VR (SJM) implanted by Dr Lovena Le   CHF (congestive heart failure) ([place])    Chronic combined systolic and diastolic heart failure ([place])    a. EF 35-40% in past;  b. Echo 7/13:  EF 45-50%, Gr 2 diast dysfn, mild AI, mild MAC, trivial MR, mild LAE, PASP 47.   Chronic ulcer of leg ([place])    04-09-15 resolved-not a problem.   Colon polyps 04/12/2013   Rectosigmoid polyp   COPD (chronic obstructive pulmonary disease) (HCC)    O2 at night   Depression    Diabetes mellitus    Diabetes mellitus, type 2 ([place]) 04/03/2012   HIGH RISK FEET.. Please have  patient take shoes and socks off every visit for visual foot inspection.     Eczema    Elevated alkaline phosphatase level    GGT and 5'nucleotidase 8/13 normal   Health care maintenance 01/22/2013   Surgically absent cervix- no pap needed (Path report 07/2000: uterine body with attached bilateral  adnexa and separate cervix.)   History of oxygen administration    oxygen @ 2 l/m nasally bedtime 24/7   HTN (hypertension)    Hx of cardiac cath    a. [place] 2003 normal;  b. LHC 6/13:  Mild calcification in the LM, o/w normal coronary arteries, EF 45%.    Hyperkalemia 08/08/2017   Hyperlipidemia    Elevated triglyceride in 2019   Hyperlipidemia associated with type 2 diabetes mellitus ([place]) 05/25/2007   Qualifier: Diagnosis of  By: Hassell Done FNP, Nykedtra     Hyperthyroidism, subclinical    HYPERTHYROIDISM, SUBCLINICAL 05/06/2009   Qualifier: Diagnosis of  By: Darrick Meigs     Implantable cardioverter-defibrillator (ICD) generator end of life    Lipoma    Need for hepatitis C screening test 04/25/2019   NICM (nonischemic cardiomyopathy) ([place])    Obesity    On home oxygen therapy    "2L; 24/7" (10/11/2016)   Post menopausal syndrome    Shortness of breath 07/14/2017   Skin rash 07/12/2018   Sleep apnea    pt denies  04/12/2013    Patient Active Problem List   Diagnosis Date Noted   'light-for-dates' infant with signs of fetal malnutrition 02/15/2022   CHF exacerbation ([place]) 02/09/2022   Hepatic steatosis 07/28/2020   Hyperlipidemia    Osteoporosis 02/12/2020   Healthcare maintenance 12/26/2019   Intrinsic atopic dermatitis 09/19/2019   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   Chronic combined systolic and diastolic congestive heart failure ([place]) 03/18/2016   Spinal stenosis of lumbar region 12/09/2015   Restrictive lung disease 06/24/2015   Diabetic gastroparesis associated with type 2 diabetes mellitus ([place]) 07/12/2014   Depression 05/22/2014   DMII (diabetes mellitus, type 2)  ([place]) 04/03/2012   Tobacco use disorder 10/26/2009   Essential hypertension 05/25/2007   ICD (implantable cardioverter-defibrillator) in place 05/04/2007    Past Surgical History:  Procedure Laterality Date   ABDOMINAL HYSTERECTOMY     CARDIAC CATHETERIZATION     CARDIAC CATHETERIZATION N/A 06/21/2016   Procedure: Left Heart Cath and Coronary Angiography;  Surgeon: Sherren Mocha, MD;  Location: [facility];  Service: Cardiovascular;  Laterality: N/A;   CARDIAC DEFIBRILLATOR PLACEMENT  05/04/2007   SJM Atlas II VR ICD   CARDIAC DEFIBRILLATOR PLACEMENT     CATARACT EXTRACTION     od   COLONOSCOPY N/A 04/12/2013   Procedure: COLONOSCOPY;  Surgeon: Beryle Beams, MD;  Location: [facility];  Service: Endoscopy;  Laterality: N/A;  pt.has defibrilator   COLONOSCOPY N/A 04/16/2015   Procedure: COLONOSCOPY;  Surgeon: [name], MD;  Location: [facility];  Service: Endoscopy;  Laterality: N/A;   ESOPHAGOGASTRODUODENOSCOPY N/A 04/16/2015   Procedure: ESOPHAGOGASTRODUODENOSCOPY (EGD);  Surgeon: [name], MD;  Location: [facility];  Service: Endoscopy;  Laterality: N/A;   EYE SURGERY     HERNIA REPAIR     HYSTEROSCOPY     IMPLANTABLE CARDIOVERTER DEFIBRILLATOR (ICD) GENERATOR CHANGE N/A 04/02/2014   Procedure: ICD GENERATOR CHANGE;  Surgeon: Evans Lance, MD;  Location[facility];  Service: Cardiovascular;  Laterality: N/A;   INSERT / REPLACE / REMOVE PACEMAKER     LEFT HEART CATHETERIZATION WITH CORONARY ANGIOGRAM N/A 04/02/2012   Procedure: LEFT HEART CATHETERIZATION WITH CORONARY ANGIOGRAM;  Surgeon: Hillary Bow, MD;  Location: [facility] CATH LAB;  Service: Cardiovascular;  Laterality: N/A;   ORIF ANKLE FRACTURE Right 08/12/2019   Procedure: OPEN REDUCTION INTERNAL FIXATION (ORIF) RIGHT TRIMALLEOLAR ANKLE FRACTURE;  Surgeon: Leandrew Koyanagi, MD;  Location: [facility];  Service: Orthopedics;  Laterality: Right;   TONSILLECTOMY     TUBAL LIGATION      OB History   No obstetric  history on file.      Home Medications    Prior to Admission medications   Medication Sig Start Date End Date Taking? Authorizing Provider  dupilumab (DUPIXENT) 200 MG/1.14ML prefilled syringe Inject 200 mg into the skin every 14 (fourteen) days.   Yes [provider]  albuterol (VENTOLIN HFA) 108 (90 Base) MCG/ACT inhaler Inhale 2 puffs into the lungs every 6 (six) hours as needed for wheezing or shortness of breath. 04/28/21   Maudie Mercury, MD  aspirin EC 81 MG tablet Take 81 mg by mouth daily. Swallow whole.    [provider]  atorvastatin (LIPITOR) 80 MG tablet Take 1 tablet (80 mg total) by mouth at bedtime. 06/24/21   Maudie Mercury, MD  Blood Glucose Monitoring Suppl (ACCU-CHEK GUIDE ME) w/Device KIT by Does not apply route.    [provider]  calcium-vitamin D (OSCAL WITH D) 500-200 MG-UNIT  tablet Take 1 tablet by mouth 2 (two) times daily. 02/12/20   Kathi Ludwig, MD  clobetasol cream (TEMOVATE) 5.63 % Apply 1 application. topically 2 (two) times daily. 01/21/22   [provider]  Continuous Blood Gluc Receiver (DEXCOM G7 RECEIVER) DEVI Use with  Dexcom G7 sensor to monitor blood sugar continuously 03/17/22   Aldine Contes, MD  Continuous Blood Gluc Sensor (DEXCOM G7 SENSOR) MISC Place new sensor every 10 days and use to monitor blood sugar continuously 03/17/22   Aldine Contes, MD  dapagliflozin propanediol (FARXIGA) 10 MG TABS tablet Take 1 tablet (10 mg total) by mouth daily. 05/02/22   Gaylan Gerold, DO  furosemide (LASIX) 40 MG tablet Take 1 tablet (40 mg total) by mouth 2 (two) times daily. 06/25/21   [place]ford, Maricela Bo, FNP  glucose blood (TRUE FOCUS BLOOD GLUCOSE STRIP) test strip Use to check blood sugar 3 times a day 11/23/21   Lacinda Axon, MD  insulin degludec (TRESIBA) 100 UNIT/ML FlexTouch Pen Inject 22 Units into the skin daily. 12/21/21   Masters, Katie, DO  Insulin lispro (HUMALOG JUNIOR KWIKPEN) 100 UNIT/ML Inject 6  Units into the skin 3 (three) times daily. 12/21/21   Masters, Katie, DO  Insulin Pen Needle (PEN NEEDLES) 32G X 4 MM MISC Use to inject insulin 3 times times per day 12/21/21   Masters, Joellen Jersey, DO  Lancets Misc. (ACCU-CHEK FASTCLIX LANCET) KIT Lancing device, use with lancets for blood glucose monitoring. 07/17/18   Kathi Ludwig, MD  Lancets MISC 1 Act by Does not apply route 4 (four) times daily. 07/11/18   Kathi Ludwig, MD  liraglutide (VICTOZA) 18 MG/3ML SOPN Inject 1.4m daily into the skin 12/26/19   HKathi Ludwig MD  metoprolol succinate (TOPROL-XL) 50 MG 24 hr tablet TAKE 1 TABLET(50 MG) BY MOUTH DAILY WITH OR IMMEDIATELY FOLLOWING A MEAL Patient taking differently: Take 50 mg by mouth daily. 06/24/21   WMaudie Mercury MD  sacubitril-valsartan (ENTRESTO) 49-51 MG Take 1 tablet by mouth 2 (two) times daily. 01/21/22   CMarianna Payment MD  spironolactone (ALDACTONE) 25 MG tablet Take 0.5 tablets (12.5 mg total) by mouth daily. 08/06/21   [place], JMaricela Bo FNP  triamcinolone cream (KENALOG) 0.1 % Apply 1 application. topically 2 (two) times daily. 11/04/21   [provider]  umeclidinium-vilanterol (ANORO ELLIPTA) 62.5-25 MCG/ACT AEPB Inhale 1 puff into the lungs daily. 12/21/21   Masters, KJoellen Jersey DO    Family History Family History  Problem Relation Age of Onset   Stroke Mother    Seizures Father    Heart disease Father    Diabetes Sister    Asthma Maternal Aunt        aunts   Asthma Maternal Uncle        uncles   Heart disease Paternal Aunt        aunts   Heart disease Paternal Uncle        uncles   Heart disease Maternal Aunt        aunts   Heart disease Maternal Uncle        uncles   Heart disease Maternal Grandfather    Breast cancer Daughter    Breast cancer Cousin    Asthma Grandchild    Colon cancer Neg Hx    Colon polyps Neg Hx    Esophageal cancer Neg Hx    Kidney disease Neg Hx    Gallbladder disease Neg Hx     Social History Social  History  Tobacco Use   Smoking status: Every Day    Packs/day: 0.50    Years: 45.00    Total pack years: 22.50    Types: Cigarettes   Smokeless tobacco: Never   Tobacco comments:    currently smoking 2 cigs per day  Vaping Use   Vaping Use: Never used  Substance Use Topics   Alcohol use: Not Currently    Alcohol/week: 3.0 standard drinks of alcohol    Types: 3 Shots of liquor per week   Drug use: No     Allergies   Actos [pioglitazone], Naproxen, Rosiglitazone, Tomato, Hydrocodone-acetaminophen, and Hydrocortisone   Review of Systems Review of Systems  Constitutional:  Positive for chills (intermittently with "Feeling cold").  All other systems reviewed and are negative.    Physical Exam Triage Vital Signs ED Triage Vitals  Enc Vitals Group     BP 05/13/22 1252 126/67     Pulse Rate 05/13/22 1252 90     Resp 05/13/22 1252 16     Temp 05/13/22 1252 97.9 F (36.6 C)     Temp Source 05/13/22 1252 Oral     SpO2 05/13/22 1252 100 %     Weight --      Height --      Head Circumference --      Peak Flow --      Pain Score 05/13/22 1253 0     Pain Loc --      Pain Edu? --      Excl. in [place]? --    No data found.  Updated Vital Signs BP 126/67 (BP Location: Right Arm)   Pulse 90   Temp 97.9 F (36.6 C) (Oral)   Resp 16   SpO2 100%   Visual Acuity Right Eye Distance:   Left Eye Distance:   Bilateral Distance:    Right Eye Near:   Left Eye Near:    Bilateral Near:     Physical Exam Vitals and nursing note reviewed.  Constitutional:      General: She is not in acute distress.    Appearance: Normal appearance. She is well-developed. She is obese. She is not ill-appearing, toxic-appearing or diaphoretic.  HENT:     Head: Normocephalic and atraumatic.     Right Ear: Tympanic membrane, ear canal and external ear normal. There is no impacted cerumen.     Left Ear: Tympanic membrane, ear canal and external ear normal. There is impacted cerumen.     Nose:  Nose normal. No congestion or rhinorrhea.     Mouth/Throat:     Mouth: Mucous membranes are moist.     Pharynx: Oropharynx is clear.  Eyes:     General: No scleral icterus.       Right eye: No discharge.        Left eye: No discharge.     Extraocular Movements: Extraocular movements intact.     Conjunctiva/sclera: Conjunctivae normal.     Pupils: Pupils are equal, round, and reactive to light.  Cardiovascular:     Rate and Rhythm: Normal rate and regular rhythm.     Comments: With intermittent premature beat Pulmonary:     Effort: Pulmonary effort is normal. No respiratory distress.     Breath sounds: Normal breath sounds. No stridor. No wheezing or rhonchi.  Abdominal:     Palpations: Abdomen is soft.     Tenderness: There is no abdominal tenderness.  Musculoskeletal:        General: No swelling.  Cervical back: Normal range of motion and neck supple. No rigidity or tenderness.     Right lower leg: Edema (chronic) present.     Left lower leg: Edema (chronic) present.  Lymphadenopathy:     Cervical: No cervical adenopathy.  Skin:    General: Skin is warm and dry.     Capillary Refill: Capillary refill takes less than 2 seconds.     Findings: No erythema.     Comments: Dry scaling skin generalized  Neurological:     General: No focal deficit present.     Mental Status: She is alert and oriented to person, place, and time.     Sensory: No sensory deficit.     Motor: No weakness.  Psychiatric:        Mood and Affect: Mood normal.      UC Treatments / Results  Labs (all labs ordered are listed, but only abnormal results are displayed) Labs Reviewed  CBC WITH DIFFERENTIAL/PLATELET  TSH    EKG   Radiology No results found.  Procedures Procedures (including critical care time)  Medications Ordered in UC Medications - No data to display  Initial Impression / Assessment and Plan / UC Course  I have reviewed the triage vital signs and the nursing  notes.  Pertinent labs & imaging results that were available during my care of the patient were reviewed by me and considered in my medical decision making (see chart for details).     Cold feeling - CBC and TSH obtained today. Pt had recent labs with PCP, last CBC showed no anemia in May 2023. No TSH for >1 year. Pt denies any s/sx of infection and otherwise feels at her baseline. Sx predated starting dupixent. F/U with PCP should sx persist. VSS   Final Clinical Impressions(s) / UC Diagnoses   Final diagnoses:  Cold feeling     Discharge Instructions      CBC And TSH testing obtained today. You do not have a fever and your vital signs are stable. If you develop severe fatigue or a fever, please head to the ER. If your symptoms persist, please perform complete workup with your PCP. We will call with the results of your labs.     ED Prescriptions   None    PDMP not reviewed this encounter.   Chaney Malling, [place] 05/13/22 1332

## 2022-05-13 NOTE — ED Triage Notes (Signed)
Reports feeling excessively cold over the last week, and feeling like she can't get warm. Has been having more of "the shakes" to try and get warm. Extensive past medical history. Denies any new pain, SOB, cough, sore throat, ear pain, nasal drainage, abdominal pain, N/V/D, no changes to bowel/bladder, dysuria, confusion/disorientation/weakness, numbness/tingling. States her daughter is a Marine scientist, and was wondering if she may have anemia or thyroid problems.

## 2022-05-13 NOTE — Discharge Instructions (Addendum)
CBC And TSH testing obtained today. You do not have a fever and your vital signs are stable. If you develop severe fatigue or a fever, please head to the ER. If your symptoms persist, please perform complete workup with your PCP. We will call with the results of your labs.

## 2022-05-23 ENCOUNTER — Other Ambulatory Visit: Payer: Self-pay

## 2022-05-23 ENCOUNTER — Ambulatory Visit (INDEPENDENT_AMBULATORY_CARE_PROVIDER_SITE_OTHER): Payer: Medicare Other | Admitting: Internal Medicine

## 2022-05-23 ENCOUNTER — Encounter: Payer: Self-pay | Admitting: Internal Medicine

## 2022-05-23 DIAGNOSIS — L2084 Intrinsic (allergic) eczema: Secondary | ICD-10-CM

## 2022-05-23 NOTE — Progress Notes (Signed)
CC: itchy skin  HPI:  [name] is a 71 y.o. person with past medical history as detailed below who presents today due to severe, whole-body itching. Please see problem based charting for detailed assessment and plan.  Past Medical History:  Diagnosis Date   ATN (acute tubular necrosis) ([place]) 07/15/2014   Automatic implantable cardioverter-defibrillator in situ    Automatic implantable cardioverter-defibrillator in situ 05/04/2007   Qualifier: Diagnosis of  By: Isla Pence     Cardiac defibrillator in situ    Atlas II VR (SJM) implanted by Dr Lovena Le   CHF (congestive heart failure) ([place])    Chronic combined systolic and diastolic heart failure ([place])    a. EF 35-40% in past;  b. Echo 7/13:  EF 45-50%, Gr 2 diast dysfn, mild AI, mild MAC, trivial MR, mild LAE, PASP 47.   Chronic ulcer of leg ([place])    04-09-15 resolved-not a problem.   Colon polyps 04/12/2013   Rectosigmoid polyp   COPD (chronic obstructive pulmonary disease) (HCC)    O2 at night   Depression    Diabetes mellitus    Diabetes mellitus, type 2 ([place]) 04/03/2012   HIGH RISK FEET.. Please have patient take shoes and socks off every visit for visual foot inspection.     Eczema    Elevated alkaline phosphatase level    GGT and 5'nucleotidase 8/13 normal   Health care maintenance 01/22/2013   Surgically absent cervix- no pap needed (Path report 07/2000: uterine body with attached bilateral  adnexa and separate cervix.)   History of oxygen administration    oxygen @ 2 l/m nasally bedtime 24/7   HTN (hypertension)    Hx of cardiac cath    a. [place] 2003 normal;  b. LHC 6/13:  Mild calcification in the LM, o/w normal coronary arteries, EF 45%.    Hyperkalemia 08/08/2017   Hyperlipidemia    Elevated triglyceride in 2019   Hyperlipidemia associated with type 2 diabetes mellitus ([place]) 05/25/2007   Qualifier: Diagnosis of  By: Hassell Done FNP, Nykedtra     Hyperthyroidism, subclinical    HYPERTHYROIDISM, SUBCLINICAL 05/06/2009    Qualifier: Diagnosis of  By: Darrick Meigs     Implantable cardioverter-defibrillator (ICD) generator end of life    Lipoma    Need for hepatitis C screening test 04/25/2019   NICM (nonischemic cardiomyopathy) ([place])    Obesity    On home oxygen therapy    "2L; 24/7" (10/11/2016)   Post menopausal syndrome    Shortness of breath 07/14/2017   Skin rash 07/12/2018   Sleep apnea    pt denies 04/12/2013   Review of Systems:  Negative unless otherwise stated.  Physical Exam:  Vitals:   05/23/22 1112 05/23/22 1120  BP: (!) 149/75 126/86  Pulse: 91   Temp: (!) 97.5 F (36.4 C)   TempSrc: Oral   SpO2: 95%   Weight: 210 lb 8 oz (95.5 kg)   Height: _0  (1.575 m)    Constitutional:Appears stated age, seated in wheelchair. In no acute distress. Cardio:Regular rate and rhythm. No murmurs, rubs, or gallops. Pulm:Clear to auscultation bilaterally. Normal work of breathing on room air. IFO:YDXAJOIN for extremity edema. Skin:All areas of visible skin are hyperpigmented and covered with dry, flaky skin. Visible lines in the dry skin from scratching appreciated. Neuro:Alert and oriented x3. No focal deficit noted. Psych:Pleasant mood and affect.   Assessment & Plan:   See Encounters Tab for problem based charting.  Intrinsic atopic dermatitis Patient  presents with diffuse itching. She has a diagnosis of intrinsic atopic dermatitis and was started on Dupixent earlier this month by dermatology. She has received the initial loading dose 2 weeks ago tomorrow and will be due tomorrow for her next injection. She receives this medication via the mail.  She states that she uses lotion to keep her skin hydrated, specifically non-scented, but in a pump bottle. She does not use Vaseline or other skin oils. She stays hydrated by mouth. Assessment:Exam findings are consistent with atopic dermatitis without adequate hydration. Plan:I discussed in depth different ways to keep her skin hydrated  including non-pump lotions, Vaseline applied all over and covered with pants/sleeves at night, and continuing to avoid fragrance items in addition to the triamcinolone cream she has been prescribed as needed. She will get her next Dupixent injection tomorrow and we reviewed the dosing and frequency. Further, clinic staff assisted in applying Vaseline over her exposed areas and engaged in further discussion regarding skin moisturizing prior to patient leaving the clinic.  Patient discussed with Dr.  Cain Sieve

## 2022-05-23 NOTE — Patient Instructions (Signed)
[name],  It was nice seeing you today! Thank you for choosing [facility] for your Primary Care.    Today we talked about your dry, itchy skin.   You are due for another Dupixent injection tomorrow. Those injections are to be given every 2 weeks.  Make sure that you are hydrating your skin well! This can be done with Vaseline all over your body. You can apply the Vaseline and then put on clothing that covers the areas you applied the Vaseline to. Make sure that the lotions you use are not the pump kind and that they are not scented. You can also continue to use the triamcinolone cream.  Please follow up in 4 weeks so that we can make sure your diabetes and other health conditions are doing okay.  My best, Dr. Marlou Sa

## 2022-05-23 NOTE — Assessment & Plan Note (Addendum)
Patient presents with diffuse itching. She has a diagnosis of intrinsic atopic dermatitis and was started on Dupixent earlier this month by dermatology. She has received the initial loading dose 2 weeks ago tomorrow and will be due tomorrow for her next injection. She receives this medication via the mail.  She states that she uses lotion to keep her skin hydrated, specifically non-scented, but in a pump bottle. She does not use Vaseline or other skin oils. She stays hydrated by mouth. Assessment:Exam findings are consistent with atopic dermatitis without adequate hydration. Plan:I discussed in depth different ways to keep her skin hydrated including non-pump lotions, Vaseline applied all over and covered with pants/sleeves at night, and continuing to avoid fragrance items in addition to the triamcinolone cream she has been prescribed as needed. She will get her next Dupixent injection tomorrow and we reviewed the dosing and frequency. Further, clinic staff assisted in applying Vaseline over her exposed areas and engaged in further discussion regarding skin moisturizing prior to patient leaving the clinic.

## 2022-05-25 NOTE — Progress Notes (Signed)
Internal Medicine Clinic Attending ? ?Case discussed with Dr. Dean  At the time of the visit.  We reviewed the resident?s history and exam and pertinent patient test results.  I agree with the assessment, diagnosis, and plan of care documented in the resident?s note.  ?

## 2022-05-30 ENCOUNTER — Ambulatory Visit (INDEPENDENT_AMBULATORY_CARE_PROVIDER_SITE_OTHER): Payer: Medicare Other

## 2022-05-30 ENCOUNTER — Other Ambulatory Visit: Payer: Self-pay

## 2022-05-30 ENCOUNTER — Ambulatory Visit (HOSPITAL_COMMUNITY)
Admission: EM | Admit: 2022-05-30 | Discharge: 2022-05-30 | Disposition: A | Discharge status: Home / Self Care | Payer: Medicare Other | Attending: Family Medicine | Admitting: Family Medicine

## 2022-05-30 ENCOUNTER — Encounter (HOSPITAL_COMMUNITY): Payer: Self-pay | Admitting: *Deleted

## 2022-05-30 DIAGNOSIS — M25512 Pain in left shoulder: Secondary | ICD-10-CM | POA: Diagnosis not present

## 2022-05-30 DIAGNOSIS — M542 Cervicalgia: Secondary | ICD-10-CM | POA: Diagnosis not present

## 2022-05-30 DIAGNOSIS — M898X1 Other specified disorders of bone, shoulder: Secondary | ICD-10-CM

## 2022-05-30 DIAGNOSIS — W19XXXA Unspecified fall, initial encounter: Secondary | ICD-10-CM

## 2022-05-30 NOTE — Discharge Instructions (Addendum)
You may take Tylenol to help with your neck pain. Your x-rays did not show any broken bone.

## 2022-05-30 NOTE — ED Triage Notes (Signed)
Pt reports a fall in bath tub on W[place]. Pt 's neck and back pain started on SAt. Pt reports hitting her head with fall but did not pass out.

## 2022-06-01 NOTE — ED Provider Notes (Signed)
[name]   [id number] 05/30/22 Arrival Time: 7628  ASSESSMENT & PLAN:  1. Neck pain on left side   2. Pain of left clavicle    I have personally viewed the imaging studies ordered this visit. No fx of L clavicle appreciated.  Prefers OTC Tylenol as needed. She is otherwise well.  Recommend:  Follow-up Information     Mapp, Tavien, MD.   Why: If worsening or failing to improve as anticipated. Contact information: [address] [place] [address] [phone]                 [place] Controlled Substances Registry consulted for this patient. I feel the risk/benefit ratio today is favorable for proceeding with this prescription for a controlled substance. Medication sedation precautions given.  Reviewed expectations re: course of current medical issues. Questions answered. Outlined signs and symptoms indicating need for more acute intervention. Patient verbalized understanding. After Visit Summary given.  SUBJECTIVE: History from: patient. [name] is a 71 y.o. female who reports falling in bathtub; few days ago; was able to catch herself. Day or two after was noting pain around L clavicle. Does not limit activities; worse when moving LUE. No extremity sensation changes or weakness. No tx PTA. No head injury.   Past Surgical History:  Procedure Laterality Date   ABDOMINAL HYSTERECTOMY     CARDIAC CATHETERIZATION     CARDIAC CATHETERIZATION N/A 06/21/2016   Procedure: Left Heart Cath and Coronary Angiography;  Surgeon: Sherren Mocha, MD;  Location: [facility];  Service: Cardiovascular;  Laterality: N/A;   CARDIAC DEFIBRILLATOR PLACEMENT  05/04/2007   SJM Atlas II VR ICD   CARDIAC DEFIBRILLATOR PLACEMENT     CATARACT EXTRACTION     od   COLONOSCOPY N/A 04/12/2013   Procedure: COLONOSCOPY;  Surgeon: Beryle Beams, MD;  Location: [facility];  Service: Endoscopy;  Laterality: N/A;  pt.has defibrilator   COLONOSCOPY N/A 04/16/2015   Procedure:  COLONOSCOPY;  Surgeon: [name], MD;  Location: [facility];  Service: Endoscopy;  Laterality: N/A;   ESOPHAGOGASTRODUODENOSCOPY N/A 04/16/2015   Procedure: ESOPHAGOGASTRODUODENOSCOPY (EGD);  Surgeon: [name], MD;  Location: [facility];  Service: Endoscopy;  Laterality: N/A;   EYE SURGERY     HERNIA REPAIR     HYSTEROSCOPY     IMPLANTABLE CARDIOVERTER DEFIBRILLATOR (ICD) GENERATOR CHANGE N/A 04/02/2014   Procedure: ICD GENERATOR CHANGE;  Surgeon: Evans Lance, MD;  [facility] LAB;  Service: Cardiovascular;  Laterality: N/A;   INSERT / REPLACE / REMOVE PACEMAKER     LEFT HEART CATHETERIZATION WITH CORONARY ANGIOGRAM N/A 04/02/2012   Procedure: LEFT HEART CATHETERIZATION WITH CORONARY ANGIOGRAM;  Surgeon: Hillary Bow, MD;  Location: [facility] CATH LAB;  Service: Cardiovascular;  Laterality: N/A;   ORIF ANKLE FRACTURE Right 08/12/2019   Procedure: OPEN REDUCTION INTERNAL FIXATION (ORIF) RIGHT TRIMALLEOLAR ANKLE FRACTURE;  Surgeon: Leandrew Koyanagi, MD;  Location: [place];  Service: Orthopedics;  Laterality: Right;   TONSILLECTOMY     TUBAL LIGATION        OBJECTIVE:  Vitals:   05/30/22 1051  BP: (!) 149/74  Pulse: 95  Resp: 20  Temp: 99.5 F (37.5 C)  SpO2: 94%    General appearance: alert; no distress HEENT: [place]; AT Neck: supple with FROM Resp: unlabored respirations Chest: is TTP over L clavicle; no tenting of skin Extremities: LUE: warm with well perfused appearance; normal ROM; moving well CV: brisk extremity capillary refill of  LUE; 2+ radial pulse of LUE. Skin: warm and dry; no visible rashes Neurologic: gait normal; normal sensation and strength of LUE Psychological: alert and cooperative; normal mood and affect  Imaging: DG Clavicle Left  Result Date: 05/30/2022 CLINICAL DATA:  Trauma, fall EXAM: LEFT CLAVICLE - 2+ VIEWS COMPARISON:  None Available. FINDINGS: No fracture is seen in left clavicle. Degenerative changes are noted in left shoulder and left AC  joints. Small calcifications adjacent to the proximal humerus may suggest calcific bursitis or calcific tendinosis. Pacemaker battery is seen in the left infraclavicular region. IMPRESSION: No fracture is seen in the left clavicle. Degenerative changes are noted in the left [facility] joint and left shoulder joint. Electronically Signed   By: Elmer Picker M.D.   On: 05/30/2022 12:00     Allergies  Allergen Reactions   Actos [Pioglitazone] Other (See Comments)    REACTION: congestive heart failure    Naproxen Other (See Comments)    555m dose made her sleep for two days   Rosiglitazone Hives   Tomato Itching   Hydrocodone-Acetaminophen Itching   Hydrocortisone Hives, Itching and Other (See Comments)    unknown    Past Medical History:  Diagnosis Date   ATN (acute tubular necrosis) ([facility] 07/15/2014   Automatic implantable cardioverter-defibrillator in situ    Automatic implantable cardioverter-defibrillator in situ 05/04/2007   Qualifier: Diagnosis of  By: CIsla Pence    Cardiac defibrillator in situ    Atlas II VR (SJM) implanted by Dr TLovena Le  CHF (congestive heart failure) ([facility]    Chronic combined systolic and diastolic heart failure ([facility]    a. EF 35-40% in past;  b. Echo 7/13:  EF 45-50%, Gr 2 diast dysfn, mild AI, mild MAC, trivial MR, mild LAE, PASP 47.   Chronic ulcer of leg ([facility]    04-09-15 resolved-not a problem.   Colon polyps 04/12/2013   Rectosigmoid polyp   COPD (chronic obstructive pulmonary disease) (HCC)    O2 at night   Depression    Diabetes mellitus    Diabetes mellitus, type 2 ([facility] 04/03/2012   HIGH RISK FEET.. Please have patient take shoes and socks off every visit for visual foot inspection.     Eczema    Elevated alkaline phosphatase level    GGT and 5'nucleotidase 8/13 normal   Health care maintenance 01/22/2013   Surgically absent cervix- no pap needed (Path report 07/2000: uterine body with attached bilateral  adnexa and separate cervix.)    History of oxygen administration    oxygen @ 2 l/m nasally bedtime 24/7   HTN (hypertension)    Hx of cardiac cath    a. [id number] normal;  b. LHC 6/13:  Mild calcification in the LM, o/w normal coronary arteries, EF 45%.    Hyperkalemia 08/08/2017   Hyperlipidemia    Elevated triglyceride in 2019   Hyperlipidemia associated with type 2 diabetes mellitus ([facility] 05/25/2007   Qualifier: Diagnosis of  By: MHassell DoneFNP, [place]     Hyperthyroidism, subclinical    HYPERTHYROIDISM, SUBCLINICAL 05/06/2009   Qualifier: Diagnosis of  By: MDarrick Meigs    Implantable cardioverter-defibrillator (ICD) generator end of life    Lipoma    Need for hepatitis C screening test 04/25/2019   NICM (nonischemic cardiomyopathy) ([facility]    Obesity    On home oxygen therapy    "2L; 24/7" (10/11/2016)   Post menopausal syndrome    Shortness of breath 07/14/2017   Skin rash 07/12/2018  Sleep apnea    pt denies 04/12/2013   Social History   Socioeconomic History   Marital status: Significant Other    Spouse name: Not on file   Number of children: 3   Years of education: 11   Highest education level: 11th grade  Occupational History   Occupation: disabled  Tobacco Use   Smoking status: Every Day    Packs/day: 0.50    Years: 45.00    Total pack years: 22.50    Types: Cigarettes   Smokeless tobacco: Never   Tobacco comments:    currently smoking 2 cigs per day  Vaping Use   Vaping Use: Never used  Substance and Sexual Activity   Alcohol use: Not Currently    Alcohol/week: 3.0 standard drinks of alcohol    Types: 3 Shots of liquor per week   Drug use: No   Sexual activity: Yes    Partners: Male    Birth control/protection: Surgical  Other Topics Concern   Not on file  Social History Narrative   ** Merged History Encounter **       Married   Patient enjoys going to [employer], church and spending time with family   Social Determinants of Health   Financial Resource Strain: Low Risk  (06/16/2021)    Overall Financial Resource Strain (CARDIA)    Difficulty of Paying Living Expenses: Not very hard  Food Insecurity: No Food Insecurity (06/16/2021)   Hunger Vital Sign    Worried About Running Out of Food in the Last Year: Never true    Ran Out of Food in the Last Year: Never true  Transportation Needs: No Transportation Needs (06/16/2021)   PRAPARE - Hydrologist (Medical): No    Lack of Transportation (Non-Medical): No  Physical Activity: Not on file  Stress: Not on file  Social Connections: Not on file   Family History  Problem Relation Age of Onset   Stroke Mother    Seizures Father    Heart disease Father    Diabetes Sister    Asthma Maternal Aunt        aunts   Asthma Maternal Uncle        uncles   Heart disease Paternal Aunt        aunts   Heart disease Paternal Uncle        uncles   Heart disease Maternal Aunt        aunts   Heart disease Maternal Uncle        uncles   Heart disease Maternal Grandfather    Breast cancer Daughter    Breast cancer Cousin    Asthma Grandchild    Colon cancer Neg Hx    Colon polyps Neg Hx    Esophageal cancer Neg Hx    Kidney disease Neg Hx    Gallbladder disease Neg Hx    Past Surgical History:  Procedure Laterality Date   ABDOMINAL HYSTERECTOMY     CARDIAC CATHETERIZATION     CARDIAC CATHETERIZATION N/A 06/21/2016   Procedure: Left Heart Cath and Coronary Angiography;  Surgeon: Sherren Mocha, MD;  Location: [place] [facility];  Service: Cardiovascular;  Laterality: N/A;   CARDIAC DEFIBRILLATOR PLACEMENT  05/04/2007   SJM Atlas II VR ICD   CARDIAC DEFIBRILLATOR PLACEMENT     CATARACT EXTRACTION     od   COLONOSCOPY N/A 04/12/2013   Procedure: COLONOSCOPY;  Surgeon: Beryle Beams, MD;  Location: [facility];  Service: Endoscopy;  Laterality: N/A;  pt.has defibrilator   COLONOSCOPY N/A 04/16/2015   Procedure: COLONOSCOPY;  Surgeon: [name], MD;  Location: [facility];  Service:  Endoscopy;  Laterality: N/A;   ESOPHAGOGASTRODUODENOSCOPY N/A 04/16/2015   Procedure: ESOPHAGOGASTRODUODENOSCOPY (EGD);  Surgeon: [name], MD;  Location: [facility];  Service: Endoscopy;  Laterality: N/A;   EYE SURGERY     HERNIA REPAIR     HYSTEROSCOPY     IMPLANTABLE CARDIOVERTER DEFIBRILLATOR (ICD) GENERATOR CHANGE N/A 04/02/2014   Procedure: ICD GENERATOR CHANGE;  Surgeon: Evans Lance, MD;  Location[facility] CATH LAB;  Service: Cardiovascular;  Laterality: N/A;   INSERT / REPLACE / REMOVE PACEMAKER     LEFT HEART CATHETERIZATION WITH CORONARY ANGIOGRAM N/A 04/02/2012   Procedure: LEFT HEART CATHETERIZATION WITH CORONARY ANGIOGRAM;  Surgeon: Hillary Bow, MD;  Location: [facility] CATH LAB;  Service: Cardiovascular;  Laterality: N/A;   ORIF ANKLE FRACTURE Right 08/12/2019   Procedure: OPEN REDUCTION INTERNAL FIXATION (ORIF) RIGHT TRIMALLEOLAR ANKLE FRACTURE;  Surgeon: Leandrew Koyanagi, MD;  Location: [place];  Service: Orthopedics;  Laterality: Right;   TONSILLECTOMY     TUBAL LIGATION         Vanessa Kick, MD 06/01/22 [phone]

## 2022-06-14 ENCOUNTER — Emergency Department (HOSPITAL_COMMUNITY): Payer: Medicare Other

## 2022-06-14 ENCOUNTER — Encounter (HOSPITAL_COMMUNITY): Payer: Self-pay

## 2022-06-14 ENCOUNTER — Other Ambulatory Visit: Payer: Self-pay

## 2022-06-14 ENCOUNTER — Emergency Department (HOSPITAL_COMMUNITY)
Admission: EM | Admit: 2022-06-14 | Discharge: 2022-06-14 | Disposition: A | Discharge status: Home / Self Care | Payer: Medicare Other | Source: Home / Self Care | Attending: Emergency Medicine | Admitting: Emergency Medicine

## 2022-06-14 DIAGNOSIS — W01198A Fall on same level from slipping, tripping and stumbling with subsequent striking against other object, initial encounter: Secondary | ICD-10-CM | POA: Insufficient documentation

## 2022-06-14 DIAGNOSIS — Z7984 Long term (current) use of oral hypoglycemic drugs: Secondary | ICD-10-CM | POA: Insufficient documentation

## 2022-06-14 DIAGNOSIS — R600 Localized edema: Secondary | ICD-10-CM | POA: Insufficient documentation

## 2022-06-14 DIAGNOSIS — S00212A Abrasion of left eyelid and periocular area, initial encounter: Secondary | ICD-10-CM | POA: Insufficient documentation

## 2022-06-14 DIAGNOSIS — Z7951 Long term (current) use of inhaled steroids: Secondary | ICD-10-CM | POA: Insufficient documentation

## 2022-06-14 DIAGNOSIS — Z20822 Contact with and (suspected) exposure to covid-19: Secondary | ICD-10-CM | POA: Insufficient documentation

## 2022-06-14 DIAGNOSIS — Z7982 Long term (current) use of aspirin: Secondary | ICD-10-CM | POA: Insufficient documentation

## 2022-06-14 DIAGNOSIS — R059 Cough, unspecified: Secondary | ICD-10-CM | POA: Insufficient documentation

## 2022-06-14 DIAGNOSIS — I110 Hypertensive heart disease with heart failure: Secondary | ICD-10-CM | POA: Insufficient documentation

## 2022-06-14 DIAGNOSIS — R55 Syncope and collapse: Secondary | ICD-10-CM | POA: Insufficient documentation

## 2022-06-14 DIAGNOSIS — Z95810 Presence of automatic (implantable) cardiac defibrillator: Secondary | ICD-10-CM | POA: Insufficient documentation

## 2022-06-14 DIAGNOSIS — Z79899 Other long term (current) drug therapy: Secondary | ICD-10-CM | POA: Insufficient documentation

## 2022-06-14 DIAGNOSIS — E119 Type 2 diabetes mellitus without complications: Secondary | ICD-10-CM | POA: Insufficient documentation

## 2022-06-14 DIAGNOSIS — J449 Chronic obstructive pulmonary disease, unspecified: Secondary | ICD-10-CM | POA: Insufficient documentation

## 2022-06-14 DIAGNOSIS — Z794 Long term (current) use of insulin: Secondary | ICD-10-CM | POA: Insufficient documentation

## 2022-06-14 DIAGNOSIS — I5042 Chronic combined systolic (congestive) and diastolic (congestive) heart failure: Secondary | ICD-10-CM | POA: Insufficient documentation

## 2022-06-14 DIAGNOSIS — R630 Anorexia: Secondary | ICD-10-CM | POA: Insufficient documentation

## 2022-06-14 DIAGNOSIS — L03115 Cellulitis of right lower limb: Secondary | ICD-10-CM | POA: Diagnosis not present

## 2022-06-14 DIAGNOSIS — A408 Other streptococcal sepsis: Secondary | ICD-10-CM | POA: Diagnosis not present

## 2022-06-14 LAB — URINALYSIS, ROUTINE W REFLEX MICROSCOPIC
Bilirubin Urine: NEGATIVE
Glucose, UA: >=500 mg/dL — AB
Ketones, ur: NEGATIVE mg/dL
Leukocytes,Ua: NEGATIVE
Nitrite: NEGATIVE
Protein, ur: NEGATIVE mg/dL
Specific Gravity, Urine: 1.015 (ref 1.005–1.030)
pH: 6 (ref 5.0–8.0)

## 2022-06-14 LAB — CBC WITH DIFFERENTIAL/PLATELET
Abs Immature Granulocytes: 0.04 10*3/uL (ref 0.00–0.07)
Basophils Absolute: 0 10*3/uL (ref 0.0–0.1)
Basophils Relative: 1 %
Eosinophils Absolute: 0.3 10*3/uL (ref 0.0–0.5)
Eosinophils Relative: 5 %
HCT: 37.2 % (ref 36.0–46.0)
Hemoglobin: 11.8 g/dL — ABNORMAL LOW (ref 12.0–15.0)
Immature Granulocytes: 1 %
Lymphocytes Relative: 13 %
Lymphs Abs: 0.9 10*3/uL (ref 0.7–4.0)
MCH: 26.7 pg (ref 26.0–34.0)
MCHC: 31.7 g/dL (ref 30.0–36.0)
MCV: 84.2 fL (ref 80.0–100.0)
Monocytes Absolute: 0.4 10*3/uL (ref 0.1–1.0)
Monocytes Relative: 6 %
Neutro Abs: 5.3 10*3/uL (ref 1.7–7.7)
Neutrophils Relative %: 74 %
Platelets: 257 10*3/uL (ref 150–400)
RBC: 4.42 MIL/uL (ref 3.87–5.11)
RDW: 16.5 % — ABNORMAL HIGH (ref 11.5–15.5)
WBC: 7.1 10*3/uL (ref 4.0–10.5)
nRBC: 0 % (ref 0.0–0.2)

## 2022-06-14 LAB — BASIC METABOLIC PANEL
Anion gap: 13 (ref 5–15)
BUN: 13 mg/dL (ref 8–23)
CO2: 24 mmol/L (ref 22–32)
Calcium: 8.1 mg/dL — ABNORMAL LOW (ref 8.9–10.3)
Chloride: 101 mmol/L (ref 98–111)
Creatinine, Ser: 1.29 mg/dL — ABNORMAL HIGH (ref 0.44–1.00)
GFR, Estimated: 45 mL/min — ABNORMAL LOW (ref >60)
Glucose, Bld: 331 mg/dL — ABNORMAL HIGH (ref 70–99)
Potassium: 3.7 mmol/L (ref 3.5–5.1)
Sodium: 138 mmol/L (ref 135–145)

## 2022-06-14 LAB — TROPONIN I (HIGH SENSITIVITY)
Troponin I (High Sensitivity): 23 ng/L — ABNORMAL HIGH (ref <18)
Troponin I (High Sensitivity): 23 ng/L — ABNORMAL HIGH (ref <18)

## 2022-06-14 LAB — SARS CORONAVIRUS 2 BY RT PCR: SARS Coronavirus 2 by RT PCR: NEGATIVE

## 2022-06-14 LAB — CBG MONITORING, ED: Glucose-Capillary: 295 mg/dL — ABNORMAL HIGH (ref 70–99)

## 2022-06-14 NOTE — ED Provider Triage Note (Signed)
Emergency Medicine Provider Triage Evaluation Note  Peggy KLEMMER , a 71 y.o. female  was evaluated in triage.  Pt complains of syncopal episode.  Patient states that she passed out on Thursday, also passed out twice before that for unknown reasons.  States that she was sitting in the kitchen and the next thing she knew she was lying on the floor.  She denies feeling weak or dizzy prior to this episode.  She did get a small abrasion to the left temple area.  Does have some pain in her neck.  She is not anticoagulated.  Review of Systems  Positive: Syncope Negative: CP, dizziness  Physical Exam  There were no vitals taken for this visit. Gen:   Awake, no distress   Resp:  Normal effort  MSK:   Moves extremities without difficulty  Other:  Small abrasion to left temple area  Medical Decision Making  Medically screening exam initiated at 1:18 PM.  Appropriate orders placed.  PAKOU RAINBOW was informed that the remainder of the evaluation will be completed by another provider, this initial triage assessment does not replace that evaluation, and the importance of remaining in the ED until their evaluation is complete.     Tacy Learn, PA-C 06/14/22 1319

## 2022-06-14 NOTE — ED Provider Notes (Signed)
[facility] EMERGENCY DEPARTMENT Provider Note   CSN: [id number] Arrival date & time: 06/14/22  1134     History  Chief Complaint  Patient presents with   Loss of Consciousness    [name] is a 71 y.o. female.   Loss of Consciousness Patient presents with a syncopal episode.  States she was standing at her table on Thursday last week.  Then woke up on the floor.  Hit her head.  Has been feeling a little off since then.  A little nauseous with a headache.  She is not on blood thinners.  Has had a somewhat decreased appetite.  No fevers.  Has an occasional cough.  Also has had 2 other falls while getting out of the bath prior to this but states that was slipping and not due to passing out.  Has a history of CHF and has a AICD.  States she is is doing okay for fluid.    Past Medical History:  Diagnosis Date   ATN (acute tubular necrosis) ([place]) 07/15/2014   Automatic implantable cardioverter-defibrillator in situ    Automatic implantable cardioverter-defibrillator in situ 05/04/2007   Qualifier: Diagnosis of  By: Isla Pence     Cardiac defibrillator in situ    Atlas II VR ([facility]) implanted by Dr Lovena Le   CHF (congestive heart failure) ([place])    Chronic combined systolic and diastolic heart failure ([place])    a. EF 35-40% in past;  b. Echo 7/13:  EF 45-50%, Gr 2 diast dysfn, mild AI, mild MAC, trivial MR, mild LAE, PASP 47.   Chronic ulcer of leg ([place])    04-09-15 resolved-not a problem.   Colon polyps 04/12/2013   Rectosigmoid polyp   COPD (chronic obstructive pulmonary disease) (HCC)    O2 at night   Depression    Diabetes mellitus    Diabetes mellitus, type 2 ([place]) 04/03/2012   HIGH RISK FEET.. Please have patient take shoes and socks off every visit for visual foot inspection.     Eczema    Elevated alkaline phosphatase level    GGT and 5'nucleotidase 8/13 normal   Health care maintenance 01/22/2013   Surgically absent cervix- no pap needed (Path report  07/2000: uterine body with attached bilateral  adnexa and separate cervix.)   History of oxygen administration    oxygen @ 2 l/m nasally bedtime 24/7   HTN (hypertension)    Hx of cardiac cath    a. [place] 2003 normal;  b. LHC 6/13:  Mild calcification in the LM, o/w normal coronary arteries, EF 45%.    Hyperkalemia 08/08/2017   Hyperlipidemia    Elevated triglyceride in 2019   Hyperlipidemia associated with type 2 diabetes mellitus ([place]) 05/25/2007   Qualifier: Diagnosis of  By: Hassell Done FNP, [place]     Hyperthyroidism, subclinical    HYPERTHYROIDISM, SUBCLINICAL 05/06/2009   Qualifier: Diagnosis of  By: Darrick Meigs     Implantable cardioverter-defibrillator (ICD) generator end of life    Lipoma    Need for hepatitis C screening test 04/25/2019   NICM (nonischemic cardiomyopathy) ([place])    Obesity    On home oxygen therapy    "2L; 24/7" (10/11/2016)   Post menopausal syndrome    Shortness of breath 07/14/2017   Skin rash 07/12/2018   Sleep apnea    pt denies 04/12/2013    Home Medications Prior to Admission medications   Medication Sig Start Date End Date Taking? Authorizing Provider  albuterol (VENTOLIN HFA)  108 (90 Base) MCG/ACT inhaler Inhale 2 puffs into the lungs every 6 (six) hours as needed for wheezing or shortness of breath. 04/28/21   Maudie Mercury, MD  aspirin EC 81 MG tablet Take 81 mg by mouth daily. Swallow whole.    [provider]  atorvastatin (LIPITOR) 80 MG tablet Take 1 tablet (80 mg total) by mouth at bedtime. 06/24/21   Maudie Mercury, MD  Blood Glucose Monitoring Suppl (ACCU-CHEK GUIDE ME) w/Device KIT by Does not apply route.    [provider]  calcium-vitamin D (OSCAL WITH D) 500-200 MG-UNIT tablet Take 1 tablet by mouth 2 (two) times daily. 02/12/20   Kathi Ludwig, MD  clobetasol cream (TEMOVATE) 8.25 % Apply 1 application. topically 2 (two) times daily. 01/21/22   [provider]  Continuous Blood Gluc Receiver (DEXCOM G7  RECEIVER) DEVI Use with  Dexcom G7 sensor to monitor blood sugar continuously 03/17/22   Aldine Contes, MD  Continuous Blood Gluc Sensor (DEXCOM G7 SENSOR) MISC Place new sensor every 10 days and use to monitor blood sugar continuously 03/17/22   Aldine Contes, MD  dapagliflozin propanediol (FARXIGA) 10 MG TABS tablet Take 1 tablet (10 mg total) by mouth daily. 05/02/22   Gaylan Gerold, DO  dupilumab (DUPIXENT) 200 MG/1.14ML prefilled syringe Inject 200 mg into the skin every 14 (fourteen) days.    [provider]  furosemide (LASIX) 40 MG tablet Take 1 tablet (40 mg total) by mouth 2 (two) times daily. 06/25/21   Milford, Maricela Bo, FNP  glucose blood (TRUE FOCUS BLOOD GLUCOSE STRIP) test strip Use to check blood sugar 3 times a day 11/23/21   Lacinda Axon, MD  insulin degludec (TRESIBA) 100 UNIT/ML FlexTouch Pen Inject 22 Units into the skin daily. 12/21/21   Masters, Katie, DO  Insulin lispro (HUMALOG JUNIOR KWIKPEN) 100 UNIT/ML Inject 6 Units into the skin 3 (three) times daily. 12/21/21   Masters, Katie, DO  Insulin Pen Needle (PEN NEEDLES) 32G X 4 MM MISC Use to inject insulin 3 times times per day 12/21/21   Masters, Joellen Jersey, DO  Lancets Misc. (ACCU-CHEK FASTCLIX LANCET) KIT Lancing device, use with lancets for blood glucose monitoring. 07/17/18   Kathi Ludwig, MD  Lancets MISC 1 Act by Does not apply route 4 (four) times daily. 07/11/18   Kathi Ludwig, MD  liraglutide (VICTOZA) 18 MG/3ML SOPN Inject 1.49m daily into the skin 12/26/19   HKathi Ludwig MD  metoprolol succinate (TOPROL-XL) 50 MG 24 hr tablet TAKE 1 TABLET(50 MG) BY MOUTH DAILY WITH OR IMMEDIATELY FOLLOWING A MEAL Patient taking differently: Take 50 mg by mouth daily. 06/24/21   WMaudie Mercury MD  sacubitril-valsartan (ENTRESTO) 49-51 MG Take 1 tablet by mouth 2 (two) times daily. 01/21/22   CMarianna Payment MD  spironolactone (ALDACTONE) 25 MG tablet Take 0.5 tablets (12.5 mg total) by mouth daily.  08/06/21   Milford, JMaricela Bo FNP  triamcinolone cream (KENALOG) 0.1 % Apply 1 application. topically 2 (two) times daily. 11/04/21   [provider]  umeclidinium-vilanterol (ANORO ELLIPTA) 62.5-25 MCG/ACT AEPB Inhale 1 puff into the lungs daily. 12/21/21   Masters, Katie, DO      Allergies    Actos [pioglitazone], Naproxen, Rosiglitazone, Tomato, Hydrocodone-acetaminophen, and Hydrocortisone    Review of Systems   Review of Systems  Cardiovascular:  Positive for syncope.    Physical Exam Updated Vital Signs BP (!) 120/92   Pulse (!) 37   Temp 98.6 F (37 C) (Oral)   Resp ([name]  23   Ht _0  (1.575 m)   Wt 81.6 kg   SpO2 90%   BMI 32.92 kg/m  Physical Exam Vitals reviewed.  HENT:     Head:     Comments: Abrasion to left superior lateral orbital area.  Mild tenderness.  Eye movements intact. Eyes:     Pupils: Pupils are equal, round, and reactive to light.  Pulmonary:     Breath sounds: Wheezing present.  Abdominal:     Tenderness: There is no abdominal tenderness.  Musculoskeletal:     Cervical back: Neck supple. No tenderness.     Right lower leg: Edema present.     Left lower leg: Edema present.  Skin:    General: Skin is warm.     Capillary Refill: Capillary refill takes less than 2 seconds.  Neurological:     Mental Status: She is oriented to person, place, and time.     ED Results / Procedures / Treatments   Labs (all labs ordered are listed, but only abnormal results are displayed) Labs Reviewed  BASIC METABOLIC PANEL - Abnormal; Notable for the following components:      Result Value   Glucose, Bld 331 (*)    Creatinine, Ser 1.29 (*)    Calcium 8.1 (*)    GFR, Estimated 45 (*)    All other components within normal limits  CBC WITH DIFFERENTIAL/PLATELET - Abnormal; Notable for the following components:   Hemoglobin 11.8 (*)    RDW 16.5 (*)    All other components within normal limits  URINALYSIS, ROUTINE W REFLEX MICROSCOPIC - Abnormal;  Notable for the following components:   Color, Urine AMBER (*)    APPearance CLOUDY (*)    Glucose, UA >=500 (*)    Hgb urine dipstick SMALL (*)    Bacteria, UA MANY (*)    All other components within normal limits  CBG MONITORING, ED - Abnormal; Notable for the following components:   Glucose-Capillary 295 (*)    All other components within normal limits  TROPONIN I (HIGH SENSITIVITY) - Abnormal; Notable for the following components:   Troponin I (High Sensitivity) 23 (*)    All other components within normal limits  TROPONIN I (HIGH SENSITIVITY) - Abnormal; Notable for the following components:   Troponin I (High Sensitivity) 23 (*)    All other components within normal limits  SARS CORONAVIRUS 2 BY RT PCR    EKG EKG Interpretation  Date/Time:  Tuesday June 14 2022 13:21:55 EDT Ventricular Rate:  90 PR Interval:  158 QRS Duration: 86 QT Interval:  414 QTC Calculation: 506 R Axis:   -53 Text Interpretation: Normal sinus rhythm with sinus arrhythmia Left anterior fascicular block Anterior infarct , age undetermined Abnormal ECG When compared with ECG of 09-Feb-2022 11:19, No significant change since last tracing Confirmed by Davonna Belling [phone]) on 06/14/2022 3:11:38 PM  Radiology DG Chest Portable 1 View  Result Date: 06/14/2022 CLINICAL DATA:  Cough. EXAM: PORTABLE CHEST 1 VIEW COMPARISON:  Feb 09, 2022. FINDINGS: Stable cardiomegaly. Left-sided defibrillator is unchanged in position. Lungs are clear. Bony thorax is unremarkable. IMPRESSION: No active disease. Electronically Signed   By: Marijo Conception M.D.   On: 06/14/2022 15:34   CT HEAD WO CONTRAST  Result Date: 06/14/2022 CLINICAL DATA:  Head trauma.  Syncope last week.  Head injury. EXAM: CT HEAD WITHOUT CONTRAST CT CERVICAL SPINE WITHOUT CONTRAST TECHNIQUE: Multidetector CT imaging of the head and cervical spine was performed following the standard  protocol without intravenous contrast. Multiplanar CT image  reconstructions of the cervical spine were also generated. RADIATION DOSE REDUCTION: This exam was performed according to the departmental dose-optimization program which includes automated exposure control, adjustment of the mA and/or kV according to patient size and/or use of iterative reconstruction technique. COMPARISON:  CT head 02/12/2016 FINDINGS: CT HEAD FINDINGS Brain: No evidence of acute infarction, hemorrhage, hydrocephalus, extra-axial collection or mass lesion/mass effect. Mild hypodensity in the white matter bilaterally most consistent with chronic microvascular ischemia. Small chronic infarct right posterior cerebellum. Empty sella with enlargement of the sella filled with CSF, unchanged from 2017 Vascular: Negative for hyperdense vessel Skull: Negative Sinuses/Orbits: Mild mucosal edema paranasal sinuses. Right cataract extraction. No orbital lesion. Other: None CT CERVICAL SPINE FINDINGS Alignment: Normal Skull base and vertebrae: Negative for fracture 5 x 8 mm sclerotic lesion C7 vertebral body on the right. This is well-circumscribed and benign-appearing. No other lesion identified. Soft tissues and spinal canal: 15 mm hypodense nodule left lateral thyroid. 1 cm nodule right thyroid. No enlarged lymph nodes in the neck. Disc levels:  Disc spaces maintained.  No significant stenosis. Upper chest: Mild airspace disease right upper lobe which could be acute or chronic. Subpleural bleb right upper lobe. Other: None IMPRESSION: 1. No acute intracranial abnormality. Chronic microvascular ischemic type changes in the white matter. 2. Negative for cervical spine fracture 3. 5 x 8 mm sclerotic lesion right C7 deep vertebral body. This is well-circumscribed and appears benign. Assuming the patient has known malignancy, this is likely a benign lesion such as bone island. 4. 15 mm lesion left lateral thyroid. 10 mm lesion right thyroid. Thyroid ultrasound recommended. (Ref: J Am Coll Radiol. 2015 Feb;12(2):  143-50). Electronically Signed   By: Franchot Gallo M.D.   On: 06/14/2022 15:12   CT CERVICAL SPINE WO CONTRAST  Result Date: 06/14/2022 CLINICAL DATA:  Head trauma.  Syncope last week.  Head injury. EXAM: CT HEAD WITHOUT CONTRAST CT CERVICAL SPINE WITHOUT CONTRAST TECHNIQUE: Multidetector CT imaging of the head and cervical spine was performed following the standard protocol without intravenous contrast. Multiplanar CT image reconstructions of the cervical spine were also generated. RADIATION DOSE REDUCTION: This exam was performed according to the departmental dose-optimization program which includes automated exposure control, adjustment of the mA and/or kV according to patient size and/or use of iterative reconstruction technique. COMPARISON:  CT head 02/12/2016 FINDINGS: CT HEAD FINDINGS Brain: No evidence of acute infarction, hemorrhage, hydrocephalus, extra-axial collection or mass lesion/mass effect. Mild hypodensity in the white matter bilaterally most consistent with chronic microvascular ischemia. Small chronic infarct right posterior cerebellum. Empty sella with enlargement of the sella filled with CSF, unchanged from 2017 Vascular: Negative for hyperdense vessel Skull: Negative Sinuses/Orbits: Mild mucosal edema paranasal sinuses. Right cataract extraction. No orbital lesion. Other: None CT CERVICAL SPINE FINDINGS Alignment: Normal Skull base and vertebrae: Negative for fracture 5 x 8 mm sclerotic lesion C7 vertebral body on the right. This is well-circumscribed and benign-appearing. No other lesion identified. Soft tissues and spinal canal: 15 mm hypodense nodule left lateral thyroid. 1 cm nodule right thyroid. No enlarged lymph nodes in the neck. Disc levels:  Disc spaces maintained.  No significant stenosis. Upper chest: Mild airspace disease right upper lobe which could be acute or chronic. Subpleural bleb right upper lobe. Other: None IMPRESSION: 1. No acute intracranial abnormality. Chronic  microvascular ischemic type changes in the white matter. 2. Negative for cervical spine fracture 3. 5 x 8 mm sclerotic lesion right  C7 deep vertebral body. This is well-circumscribed and appears benign. Assuming the patient has known malignancy, this is likely a benign lesion such as bone island. 4. 15 mm lesion left lateral thyroid. 10 mm lesion right thyroid. Thyroid ultrasound recommended. (Ref: J Am Coll Radiol. 2015 Feb;12(2): 143-50). Electronically Signed   By: Franchot Gallo M.D.   On: 06/14/2022 15:12    Procedures Procedures    Medications Ordered in ED Medications - No data to display  ED Course/ Medical Decision Making/ A&P                           Medical Decision Making Amount and/or Complexity of Data Reviewed Radiology: ordered.   Patient with syncopal episode.  However happened a few days ago.  Hit head.  Head CT and cervical spine CT overall reassuring for traumatic standpoint.  Does have thyroid nodule that needs following.  Will get orthostatic vital signs.  Will get basic blood work.  Will interrogate [place] did not show any episodes during the time.  1 out of happened.  Feeling better here.  Creatinine just slightly elevated.  Not orthostatic.  Discussed with cardiology who reviewed the information.  Discussed with Dr. Johney Frame.  Since the event was a few days ago do not feel we need admission to the hospital.  Can follow-up as an outpatient.  Will discharge home.        Final Clinical Impression(s) / ED Diagnoses Final diagnoses:  Syncope, unspecified syncope type    Rx / DC Orders ED Discharge Orders     None         Davonna Belling, MD 06/14/22 2328

## 2022-06-14 NOTE — ED Notes (Signed)
No answer when called for triage x1

## 2022-06-14 NOTE — ED Triage Notes (Signed)
Pt had a syncopal episode on last Thursday, + head strike, + LOC. Pt has abrasion to left eye and swelling to left eye.   Pt fell twice prior to this incident in the bathtub. + neck pain,

## 2022-06-14 NOTE — Discharge Instructions (Signed)
Follow-up with your cardiologist for passing out.  Your sugar was also high and your creatinine was just mildly increased from before.  Follow-up with your doctors as needed.

## 2022-06-16 ENCOUNTER — Telehealth: Payer: Self-pay

## 2022-06-16 NOTE — Telephone Encounter (Signed)
Attempted to call patient 3 times, left message. Also called alternative contact Stanton Kidney, without response. Front desk also left message without return call. Will reschedule.

## 2022-06-16 NOTE — Progress Notes (Deleted)
Attempted to call patient 3 times, left message. Also called alternative contact Stanton Kidney, without response. Front desk also left message without return call. Will reschedule.

## 2022-06-17 ENCOUNTER — Ambulatory Visit (HOSPITAL_COMMUNITY): Payer: Medicare Other

## 2022-06-17 ENCOUNTER — Emergency Department (HOSPITAL_COMMUNITY): Payer: Medicare Other

## 2022-06-17 ENCOUNTER — Observation Stay (HOSPITAL_COMMUNITY): Payer: Medicare Other

## 2022-06-17 ENCOUNTER — Inpatient Hospital Stay (HOSPITAL_COMMUNITY)
Admission: EM | Admit: 2022-06-17 | Discharge: 2022-06-24 | DRG: 871 | Disposition: A | Discharge status: Home Health | Payer: Medicare Other | Attending: Student in an Organized Health Care Education/Training Program | Admitting: Student in an Organized Health Care Education/Training Program

## 2022-06-17 ENCOUNTER — Other Ambulatory Visit: Payer: Self-pay

## 2022-06-17 ENCOUNTER — Inpatient Hospital Stay (HOSPITAL_COMMUNITY): Payer: Medicare Other

## 2022-06-17 ENCOUNTER — Encounter (HOSPITAL_COMMUNITY): Payer: Self-pay

## 2022-06-17 DIAGNOSIS — E1169 Type 2 diabetes mellitus with other specified complication: Secondary | ICD-10-CM | POA: Diagnosis present

## 2022-06-17 DIAGNOSIS — Z20822 Contact with and (suspected) exposure to covid-19: Secondary | ICD-10-CM | POA: Diagnosis present

## 2022-06-17 DIAGNOSIS — I10 Essential (primary) hypertension: Secondary | ICD-10-CM | POA: Diagnosis not present

## 2022-06-17 DIAGNOSIS — G9341 Metabolic encephalopathy: Secondary | ICD-10-CM | POA: Diagnosis present

## 2022-06-17 DIAGNOSIS — E875 Hyperkalemia: Secondary | ICD-10-CM | POA: Diagnosis present

## 2022-06-17 DIAGNOSIS — Z803 Family history of malignant neoplasm of breast: Secondary | ICD-10-CM

## 2022-06-17 DIAGNOSIS — Z8249 Family history of ischemic heart disease and other diseases of the circulatory system: Secondary | ICD-10-CM

## 2022-06-17 DIAGNOSIS — G4730 Sleep apnea, unspecified: Secondary | ICD-10-CM | POA: Diagnosis present

## 2022-06-17 DIAGNOSIS — L89322 Pressure ulcer of left buttock, stage 2: Secondary | ICD-10-CM | POA: Diagnosis present

## 2022-06-17 DIAGNOSIS — E860 Dehydration: Secondary | ICD-10-CM | POA: Diagnosis present

## 2022-06-17 DIAGNOSIS — R609 Edema, unspecified: Secondary | ICD-10-CM | POA: Diagnosis not present

## 2022-06-17 DIAGNOSIS — E11649 Type 2 diabetes mellitus with hypoglycemia without coma: Secondary | ICD-10-CM | POA: Diagnosis present

## 2022-06-17 DIAGNOSIS — A419 Sepsis, unspecified organism: Secondary | ICD-10-CM | POA: Diagnosis not present

## 2022-06-17 DIAGNOSIS — E872 Acidosis, unspecified: Secondary | ICD-10-CM | POA: Diagnosis present

## 2022-06-17 DIAGNOSIS — E785 Hyperlipidemia, unspecified: Secondary | ICD-10-CM | POA: Diagnosis present

## 2022-06-17 DIAGNOSIS — A408 Other streptococcal sepsis: Secondary | ICD-10-CM | POA: Diagnosis present

## 2022-06-17 DIAGNOSIS — J984 Other disorders of lung: Secondary | ICD-10-CM

## 2022-06-17 DIAGNOSIS — E1129 Type 2 diabetes mellitus with other diabetic kidney complication: Secondary | ICD-10-CM | POA: Diagnosis present

## 2022-06-17 DIAGNOSIS — Z95810 Presence of automatic (implantable) cardiac defibrillator: Secondary | ICD-10-CM | POA: Diagnosis not present

## 2022-06-17 DIAGNOSIS — L0390 Cellulitis, unspecified: Secondary | ICD-10-CM | POA: Diagnosis not present

## 2022-06-17 DIAGNOSIS — F32A Depression, unspecified: Secondary | ICD-10-CM | POA: Diagnosis present

## 2022-06-17 DIAGNOSIS — I110 Hypertensive heart disease with heart failure: Secondary | ICD-10-CM | POA: Diagnosis present

## 2022-06-17 DIAGNOSIS — Z8781 Personal history of (healed) traumatic fracture: Secondary | ICD-10-CM

## 2022-06-17 DIAGNOSIS — L8990 Pressure ulcer of unspecified site, unspecified stage: Secondary | ICD-10-CM | POA: Insufficient documentation

## 2022-06-17 DIAGNOSIS — F17210 Nicotine dependence, cigarettes, uncomplicated: Secondary | ICD-10-CM | POA: Diagnosis present

## 2022-06-17 DIAGNOSIS — E861 Hypovolemia: Secondary | ICD-10-CM | POA: Diagnosis present

## 2022-06-17 DIAGNOSIS — E1165 Type 2 diabetes mellitus with hyperglycemia: Secondary | ICD-10-CM | POA: Diagnosis present

## 2022-06-17 DIAGNOSIS — L03115 Cellulitis of right lower limb: Secondary | ICD-10-CM | POA: Diagnosis present

## 2022-06-17 DIAGNOSIS — I082 Rheumatic disorders of both aortic and tricuspid valves: Secondary | ICD-10-CM | POA: Diagnosis present

## 2022-06-17 DIAGNOSIS — S00212A Abrasion of left eyelid and periocular area, initial encounter: Secondary | ICD-10-CM | POA: Diagnosis present

## 2022-06-17 DIAGNOSIS — Z823 Family history of stroke: Secondary | ICD-10-CM

## 2022-06-17 DIAGNOSIS — Z8719 Personal history of other diseases of the digestive system: Secondary | ICD-10-CM

## 2022-06-17 DIAGNOSIS — E0590 Thyrotoxicosis, unspecified without thyrotoxic crisis or storm: Secondary | ICD-10-CM | POA: Diagnosis present

## 2022-06-17 DIAGNOSIS — Z6837 Body mass index (BMI) 37.0-37.9, adult: Secondary | ICD-10-CM

## 2022-06-17 DIAGNOSIS — I5020 Unspecified systolic (congestive) heart failure: Secondary | ICD-10-CM | POA: Diagnosis present

## 2022-06-17 DIAGNOSIS — I509 Heart failure, unspecified: Secondary | ICD-10-CM | POA: Diagnosis not present

## 2022-06-17 DIAGNOSIS — I5022 Chronic systolic (congestive) heart failure: Secondary | ICD-10-CM | POA: Diagnosis present

## 2022-06-17 DIAGNOSIS — Z888 Allergy status to other drugs, medicaments and biological substances status: Secondary | ICD-10-CM

## 2022-06-17 DIAGNOSIS — I5023 Acute on chronic systolic (congestive) heart failure: Secondary | ICD-10-CM | POA: Diagnosis present

## 2022-06-17 DIAGNOSIS — W06XXXA Fall from bed, initial encounter: Secondary | ICD-10-CM | POA: Diagnosis present

## 2022-06-17 DIAGNOSIS — J449 Chronic obstructive pulmonary disease, unspecified: Secondary | ICD-10-CM | POA: Diagnosis present

## 2022-06-17 DIAGNOSIS — Y92009 Unspecified place in unspecified non-institutional (private) residence as the place of occurrence of the external cause: Secondary | ICD-10-CM

## 2022-06-17 DIAGNOSIS — Z885 Allergy status to narcotic agent status: Secondary | ICD-10-CM

## 2022-06-17 DIAGNOSIS — I5042 Chronic combined systolic (congestive) and diastolic (congestive) heart failure: Secondary | ICD-10-CM | POA: Diagnosis present

## 2022-06-17 DIAGNOSIS — Z825 Family history of asthma and other chronic lower respiratory diseases: Secondary | ICD-10-CM

## 2022-06-17 DIAGNOSIS — R680 Hypothermia, not associated with low environmental temperature: Secondary | ICD-10-CM | POA: Diagnosis not present

## 2022-06-17 DIAGNOSIS — R7881 Bacteremia: Secondary | ICD-10-CM | POA: Diagnosis not present

## 2022-06-17 DIAGNOSIS — B954 Other streptococcus as the cause of diseases classified elsewhere: Secondary | ICD-10-CM | POA: Diagnosis not present

## 2022-06-17 DIAGNOSIS — N179 Acute kidney failure, unspecified: Secondary | ICD-10-CM | POA: Diagnosis present

## 2022-06-17 DIAGNOSIS — Z90710 Acquired absence of both cervix and uterus: Secondary | ICD-10-CM

## 2022-06-17 DIAGNOSIS — I878 Other specified disorders of veins: Secondary | ICD-10-CM | POA: Diagnosis present

## 2022-06-17 DIAGNOSIS — L309 Dermatitis, unspecified: Secondary | ICD-10-CM | POA: Diagnosis present

## 2022-06-17 DIAGNOSIS — I428 Other cardiomyopathies: Secondary | ICD-10-CM | POA: Diagnosis present

## 2022-06-17 DIAGNOSIS — Z91018 Allergy to other foods: Secondary | ICD-10-CM

## 2022-06-17 DIAGNOSIS — I351 Nonrheumatic aortic (valve) insufficiency: Secondary | ICD-10-CM | POA: Diagnosis not present

## 2022-06-17 DIAGNOSIS — E669 Obesity, unspecified: Secondary | ICD-10-CM | POA: Diagnosis present

## 2022-06-17 DIAGNOSIS — E1142 Type 2 diabetes mellitus with diabetic polyneuropathy: Secondary | ICD-10-CM

## 2022-06-17 DIAGNOSIS — Z833 Family history of diabetes mellitus: Secondary | ICD-10-CM

## 2022-06-17 DIAGNOSIS — I5032 Chronic diastolic (congestive) heart failure: Secondary | ICD-10-CM | POA: Diagnosis present

## 2022-06-17 LAB — URINALYSIS, ROUTINE W REFLEX MICROSCOPIC
Bacteria, UA: NONE SEEN
Bilirubin Urine: NEGATIVE
Glucose, UA: NEGATIVE mg/dL
Ketones, ur: NEGATIVE mg/dL
Leukocytes,Ua: NEGATIVE
Nitrite: NEGATIVE
Protein, ur: 30 mg/dL — AB
Specific Gravity, Urine: 1.008 (ref 1.005–1.030)
pH: 5 (ref 5.0–8.0)

## 2022-06-17 LAB — LACTIC ACID, PLASMA
Lactic Acid, Venous: 2.6 mmol/L (ref 0.5–1.9)
Lactic Acid, Venous: 2 mmol/L (ref 0.5–1.9)
Lactic Acid, Venous: 3 mmol/L (ref 0.5–1.9)

## 2022-06-17 LAB — CBC WITH DIFFERENTIAL/PLATELET
Abs Immature Granulocytes: 0.06 10*3/uL (ref 0.00–0.07)
Basophils Absolute: 0 10*3/uL (ref 0.0–0.1)
Basophils Relative: 0 %
Eosinophils Absolute: 0 10*3/uL (ref 0.0–0.5)
Eosinophils Relative: 0 %
HCT: 40.1 % (ref 36.0–46.0)
Hemoglobin: 13 g/dL (ref 12.0–15.0)
Immature Granulocytes: 0 %
Lymphocytes Relative: 9 %
Lymphs Abs: 1.3 10*3/uL (ref 0.7–4.0)
MCH: 26.2 pg (ref 26.0–34.0)
MCHC: 32.4 g/dL (ref 30.0–36.0)
MCV: 80.8 fL (ref 80.0–100.0)
Monocytes Absolute: 0.7 10*3/uL (ref 0.1–1.0)
Monocytes Relative: 5 %
Neutro Abs: 11.8 10*3/uL — ABNORMAL HIGH (ref 1.7–7.7)
Neutrophils Relative %: 86 %
Platelets: 203 10*3/uL (ref 150–400)
RBC: 4.96 MIL/uL (ref 3.87–5.11)
RDW: 15.9 % — ABNORMAL HIGH (ref 11.5–15.5)
WBC: 13.9 10*3/uL — ABNORMAL HIGH (ref 4.0–10.5)
nRBC: 0 % (ref 0.0–0.2)

## 2022-06-17 LAB — BLOOD CULTURE ID PANEL (REFLEXED) - BCID2

## 2022-06-17 LAB — SARS CORONAVIRUS 2 BY RT PCR: SARS Coronavirus 2 by RT PCR: NEGATIVE

## 2022-06-17 LAB — GLUCOSE, CAPILLARY: Glucose-Capillary: 178 mg/dL — ABNORMAL HIGH (ref 70–99)

## 2022-06-17 LAB — COMPREHENSIVE METABOLIC PANEL
ALT: 20 U/L (ref 0–44)
AST: 31 U/L (ref 15–41)
Albumin: 2.3 g/dL — ABNORMAL LOW (ref 3.5–5.0)
Alkaline Phosphatase: 182 U/L — ABNORMAL HIGH (ref 38–126)
Anion gap: 13 (ref 5–15)
BUN: 13 mg/dL (ref 8–23)
CO2: 24 mmol/L (ref 22–32)
Calcium: 8.5 mg/dL — ABNORMAL LOW (ref 8.9–10.3)
Chloride: 102 mmol/L (ref 98–111)
Creatinine, Ser: 1.53 mg/dL — ABNORMAL HIGH (ref 0.44–1.00)
GFR, Estimated: 37 mL/min — ABNORMAL LOW (ref >60)
Glucose, Bld: 251 mg/dL — ABNORMAL HIGH (ref 70–99)
Potassium: 3.8 mmol/L (ref 3.5–5.1)
Sodium: 139 mmol/L (ref 135–145)
Total Bilirubin: 2.1 mg/dL — ABNORMAL HIGH (ref 0.3–1.2)
Total Protein: 6.5 g/dL (ref 6.5–8.1)

## 2022-06-17 LAB — PROTIME-INR
INR: 1.4 — ABNORMAL HIGH (ref 0.8–1.2)
Prothrombin Time: 17.1 seconds — ABNORMAL HIGH (ref 11.4–15.2)

## 2022-06-17 LAB — CBG MONITORING, ED
Glucose-Capillary: 228 mg/dL — ABNORMAL HIGH (ref 70–99)
Glucose-Capillary: 61 mg/dL — ABNORMAL LOW (ref 70–99)
Glucose-Capillary: 100 mg/dL — ABNORMAL HIGH (ref 70–99)

## 2022-06-17 LAB — HEMOGLOBIN A1C
Hgb A1c MFr Bld: 9.7 % — ABNORMAL HIGH (ref 4.8–5.6)
Mean Plasma Glucose: 231.69 mg/dL

## 2022-06-17 LAB — APTT: aPTT: 33 seconds (ref 24–36)

## 2022-06-17 LAB — CK: Total CK: 334 U/L — ABNORMAL HIGH (ref 38–234)

## 2022-06-17 LAB — MRSA NEXT GEN BY PCR, NASAL: MRSA by PCR Next Gen: NOT DETECTED

## 2022-06-17 MED ORDER — ORAL CARE MOUTH RINSE: 15.0000 mL | OROMUCOSAL | Status: DC | PRN

## 2022-06-17 MED ORDER — PROCHLORPERAZINE EDISYLATE 10 MG/2ML IJ SOLN
5.0000 mg | Freq: Once | INTRAMUSCULAR | Status: DC
  Filled 2022-06-17: qty 1

## 2022-06-17 MED ORDER — METOPROLOL SUCCINATE ER 25 MG PO TB24: 50.0000 mg | ORAL_TABLET | Freq: Every day | ORAL | Status: DC

## 2022-06-17 MED ORDER — LACTATED RINGERS IV BOLUS (SEPSIS): 1000.0000 mL | Freq: Once | INTRAVENOUS | Status: DC

## 2022-06-17 MED ORDER — ENOXAPARIN SODIUM 40 MG/0.4ML IJ SOSY
40.0000 mg | PREFILLED_SYRINGE | INTRAMUSCULAR | Status: DC
  Administered 2022-06-17 – 2022-06-24 (×8): 40 mg via SUBCUTANEOUS
  Filled 2022-06-17 (×8): qty 0.4

## 2022-06-17 MED ORDER — ACETAMINOPHEN 650 MG RE SUPP: 650.0000 mg | Freq: Four times a day (QID) | RECTAL | Status: DC | PRN

## 2022-06-17 MED ORDER — ACETAMINOPHEN 325 MG PO TABS
650.0000 mg | ORAL_TABLET | Freq: Once | ORAL | Status: AC
  Administered 2022-06-17: 650 mg via ORAL
  Filled 2022-06-17: qty 2

## 2022-06-17 MED ORDER — LACTATED RINGERS IV SOLN: INTRAVENOUS | Status: DC

## 2022-06-17 MED ORDER — LACTATED RINGERS IV BOLUS
500.0000 mL | Freq: Once | INTRAVENOUS | Status: AC
  Administered 2022-06-17: 500 mL via INTRAVENOUS

## 2022-06-17 MED ORDER — ATORVASTATIN CALCIUM 80 MG PO TABS
80.0000 mg | ORAL_TABLET | Freq: Every day | ORAL | Status: DC
  Administered 2022-06-17 – 2022-06-23 (×7): 80 mg via ORAL
  Filled 2022-06-17 (×7): qty 1

## 2022-06-17 MED ORDER — INSULIN GLARGINE-YFGN 100 UNIT/ML ~~LOC~~ SOLN
22.0000 [IU] | Freq: Every day | SUBCUTANEOUS | Status: DC
  Administered 2022-06-17 – 2022-06-18 (×2): 22 [IU] via SUBCUTANEOUS
  Filled 2022-06-17 (×3): qty 0.22

## 2022-06-17 MED ORDER — VANCOMYCIN HCL 1500 MG/300ML IV SOLN
1500.0000 mg | Freq: Once | INTRAVENOUS | Status: AC
  Administered 2022-06-17: 1500 mg via INTRAVENOUS
  Filled 2022-06-17: qty 300

## 2022-06-17 MED ORDER — ALBUTEROL SULFATE (2.5 MG/3ML) 0.083% IN NEBU: 3.0000 mL | INHALATION_SOLUTION | Freq: Four times a day (QID) | RESPIRATORY_TRACT | Status: DC | PRN

## 2022-06-17 MED ORDER — METRONIDAZOLE 500 MG/100ML IV SOLN
500.0000 mg | Freq: Once | INTRAVENOUS | Status: DC
  Filled 2022-06-17: qty 100

## 2022-06-17 MED ORDER — SODIUM CHLORIDE 0.9 % IV SOLN
2.0000 g | Freq: Two times a day (BID) | INTRAVENOUS | Status: DC
  Administered 2022-06-17 – 2022-06-19 (×4): 2 g via INTRAVENOUS
  Filled 2022-06-17 (×4): qty 12.5

## 2022-06-17 MED ORDER — VANCOMYCIN HCL IN DEXTROSE 1-5 GM/200ML-% IV SOLN: 1000.0000 mg | Freq: Once | INTRAVENOUS | Status: DC

## 2022-06-17 MED ORDER — INSULIN ASPART 100 UNIT/ML IJ SOLN
6.0000 [IU] | Freq: Three times a day (TID) | INTRAMUSCULAR | Status: DC
  Administered 2022-06-17: 6 [IU] via SUBCUTANEOUS

## 2022-06-17 MED ORDER — LACTATED RINGERS IV BOLUS (SEPSIS): 500.0000 mL | Freq: Once | INTRAVENOUS | Status: DC

## 2022-06-17 MED ORDER — SODIUM CHLORIDE 0.9 % IV SOLN: INTRAVENOUS | Status: DC | PRN

## 2022-06-17 MED ORDER — DAPAGLIFLOZIN PROPANEDIOL 10 MG PO TABS: 10.0000 mg | ORAL_TABLET | Freq: Every day | ORAL | Status: DC

## 2022-06-17 MED ORDER — SODIUM CHLORIDE 0.9 % IV SOLN
2.0000 g | Freq: Once | INTRAVENOUS | Status: AC
  Administered 2022-06-17: 2 g via INTRAVENOUS
  Filled 2022-06-17: qty 12.5

## 2022-06-17 MED ORDER — ASPIRIN 81 MG PO TBEC
81.0000 mg | DELAYED_RELEASE_TABLET | Freq: Every day | ORAL | Status: DC
  Administered 2022-06-17 – 2022-06-24 (×8): 81 mg via ORAL
  Filled 2022-06-17 (×8): qty 1

## 2022-06-17 MED ORDER — INSULIN ASPART 100 UNIT/ML IJ SOLN
0.0000 [IU] | Freq: Three times a day (TID) | INTRAMUSCULAR | Status: DC
  Administered 2022-06-17: 5 [IU] via SUBCUTANEOUS
  Administered 2022-06-18: 3 [IU] via SUBCUTANEOUS

## 2022-06-17 MED ORDER — SODIUM CHLORIDE 0.9 % IV SOLN: 2.0000 g | Freq: Two times a day (BID) | INTRAVENOUS | Status: DC

## 2022-06-17 MED ORDER — LACTATED RINGERS IV BOLUS (SEPSIS)
1000.0000 mL | Freq: Once | INTRAVENOUS | Status: AC
  Administered 2022-06-17: 1000 mL via INTRAVENOUS

## 2022-06-17 MED ORDER — UMECLIDINIUM-VILANTEROL 62.5-25 MCG/ACT IN AEPB
1.0000 | INHALATION_SPRAY | Freq: Every day | RESPIRATORY_TRACT | Status: DC
  Administered 2022-06-18 – 2022-06-24 (×6): 1 via RESPIRATORY_TRACT
  Filled 2022-06-17 (×2): qty 14

## 2022-06-17 MED ORDER — VANCOMYCIN HCL IN DEXTROSE 1-5 GM/200ML-% IV SOLN: 1000.0000 mg | INTRAVENOUS | Status: DC

## 2022-06-17 MED ORDER — ACETAMINOPHEN 325 MG PO TABS
650.0000 mg | ORAL_TABLET | Freq: Four times a day (QID) | ORAL | Status: DC | PRN
  Administered 2022-06-17 – 2022-06-21 (×4): 650 mg via ORAL
  Filled 2022-06-17 (×4): qty 2

## 2022-06-17 NOTE — ED Notes (Signed)
Sindy Messing daughter [phone] requesting an update on the patient

## 2022-06-17 NOTE — Progress Notes (Addendum)
PHARMACY - PHYSICIAN COMMUNICATION CRITICAL VALUE ALERT - BLOOD CULTURE IDENTIFICATION (BCID)  [name] is an 71 y.o. female who presented to [facility] on 06/17/2022 with a chief complaint of Right lower leg cellulitis  Assessment:  41 yoF presented with sepsis/ RLE cellulitis. Currntly on vacomycin and cefepime. WBC and ANC elevated, lactate elevated, Scr 1.53 (bl~1.3). BCID detected strep species with Blood cultures showing gram positive cocci in chains in 2/4.  Name of physician (or Provider) Contacted: Dr. Elliot Gurney  Current antibiotics: cefepime   Changes to prescribed antibiotics recommended:  Patient is on recommended antibiotics - No changes needed  Results for orders placed or performed during the hospital encounter of 06/17/22  Blood Culture ID Panel (Reflexed) (Collected: 06/17/2022  3:00 AM)  Result Value Ref Range   Enterococcus faecalis NOT DETECTED NOT DETECTED   Enterococcus Faecium NOT DETECTED NOT DETECTED   Listeria monocytogenes NOT DETECTED NOT DETECTED   Staphylococcus species NOT DETECTED NOT DETECTED   Staphylococcus aureus (BCID) NOT DETECTED NOT DETECTED   Staphylococcus epidermidis NOT DETECTED NOT DETECTED   Staphylococcus lugdunensis NOT DETECTED NOT DETECTED   Streptococcus species DETECTED (A) NOT DETECTED   Streptococcus agalactiae NOT DETECTED NOT DETECTED   Streptococcus pneumoniae NOT DETECTED NOT DETECTED   Streptococcus pyogenes NOT DETECTED NOT DETECTED   A.calcoaceticus-baumannii NOT DETECTED NOT DETECTED   Bacteroides fragilis NOT DETECTED NOT DETECTED   Enterobacterales NOT DETECTED NOT DETECTED   Enterobacter cloacae complex NOT DETECTED NOT DETECTED   Escherichia coli NOT DETECTED NOT DETECTED   Klebsiella aerogenes NOT DETECTED NOT DETECTED   Klebsiella oxytoca NOT DETECTED NOT DETECTED   Klebsiella pneumoniae NOT DETECTED NOT DETECTED   Proteus species NOT DETECTED NOT DETECTED   Salmonella species NOT DETECTED NOT DETECTED    Serratia marcescens NOT DETECTED NOT DETECTED   Haemophilus influenzae NOT DETECTED NOT DETECTED   Neisseria meningitidis NOT DETECTED NOT DETECTED   Pseudomonas aeruginosa NOT DETECTED NOT DETECTED   Stenotrophomonas maltophilia NOT DETECTED NOT DETECTED   Candida albicans NOT DETECTED NOT DETECTED   Candida auris NOT DETECTED NOT DETECTED   Candida glabrata NOT DETECTED NOT DETECTED   Candida krusei NOT DETECTED NOT DETECTED   Candida parapsilosis NOT DETECTED NOT DETECTED   Candida tropicalis NOT DETECTED NOT DETECTED   Cryptococcus neoformans/gattii NOT DETECTED NOT DETECTED    Sandford Craze, PharmD. [facility] Acute Care PGY-1  06/17/2022 5:13 PM

## 2022-06-17 NOTE — ED Notes (Signed)
;  reject x2,hard stick

## 2022-06-17 NOTE — H&P (Signed)
Date: 06/17/2022               Patient Name:  [name] MRN: [id number]  DOB: 08-22-1951 Age / Sex: 71 y.o., female   PCP: [name], MD         Medical Service: Internal Medicine Teaching Service         Attending Physician: Dr. Evette Doffing, Mallie Mussel, *    First Contact: [name], MD      Pager: [phone]      Second Contact: Iona Beard, MD      Pager: [place] Rooks [phone]           After Hours (After 5p/  First Contact Pager: [phone]  weekends / holidays): Second Contact Pager: [phone]   SUBJECTIVE   Chief Complaint: Weakness and fatigue   History of Present Illness: [name] is a 71 year old female with a past medical history of CHF post ICD, hypertension, restrictive lung disease, type II diabetes, and hyperlipidemia who presents for weakness and fatigue.  The patient visited the [facility] ED on 9-12 after a syncopal episode where workup was unrevealing. She reports feeling weak and tired for the past few days, primarily staying in bed all day yesterday 9-14. Earlier today, around 1:00, she awoke and decided to fix a sandwich but slipped onto the floor. She was unable to get up and thus laid on the floor, eventually falling asleep. Her neighbor came in and called EMS. She denies hitting her head or losing consciousness.  She also reports feeling very cold with chills over the last few days. Denies myalgias, fevers, cough, diarrhea, constipation, burning with urination, pain with urination, and urinary frequency. States her urine is red-tinged, which began about a week ago. Additionally, she reports a loss of appetite and states that her last full meal was about 2 weeks ago. She has been eating some canned sausage every other day or so but otherwise just eats some snacks. She has been drinking about 3-4 bottles (16oz) of water daily. Her right leg has been painful and more swollen since about Monday or Tuesday. It has also been painful since this time. She has not  noticed any wounds or lesions.  At night, she uses 2L oxygen and has some breath shortness when she does not wear her oxygen. This is not new for her, as she has a history of restrictive lung disease and follows with a pulmonologist.   ED Course: Upon arrival to the ED, vitals were notable for Temp 101.1, HR 118, and BP 149/79. Laboratory testing revealed lactate 2.6, Cr 1.53, GFR 37, AlkPhos 182, and WBC 13.9. Chest radiograph negative for interstitial prominence in the right upper lung, possibly edema or infection. Tibia-fibula radiograph negative for an acute osseus abnormality. Lower extremity Doppler study negative. Bolus of 1L LR was administered. Cefepime and vancomycin were started.    Meds:  Current Meds  Medication Sig   albuterol (VENTOLIN HFA) 108 (90 Base) MCG/ACT inhaler Inhale 2 puffs into the lungs every 6 (six) hours as needed for wheezing or shortness of breath.   aspirin EC 81 MG tablet Take 81 mg by mouth daily. Swallow whole.   atorvastatin (LIPITOR) 80 MG tablet Take 1 tablet (80 mg total) by mouth at bedtime.   calcium-vitamin D (OSCAL WITH D) 500-200 MG-UNIT tablet Take 1 tablet by mouth 2 (two) times daily.   dapagliflozin propanediol (FARXIGA) 10 MG TABS tablet Take 1 tablet (10 mg total) by mouth daily.   dupilumab ([place])  200 MG/1.14ML prefilled syringe Inject 200 mg into the skin every 14 (fourteen) days. Every other Friday   furosemide (LASIX) 40 MG tablet Take 1 tablet (40 mg total) by mouth 2 (two) times daily.   insulin degludec (TRESIBA) 100 UNIT/ML FlexTouch Pen Inject 22 Units into the skin daily.   Insulin lispro (HUMALOG JUNIOR KWIKPEN) 100 UNIT/ML Inject 6 Units into the skin 3 (three) times daily.   liraglutide (VICTOZA) 18 MG/3ML SOPN Inject 1.'8mg'$  daily into the skin   metoprolol succinate (TOPROL-XL) 50 MG 24 hr tablet TAKE 1 TABLET(50 MG) BY MOUTH DAILY WITH OR IMMEDIATELY FOLLOWING A MEAL (Patient taking differently: Take 50 mg by mouth daily.)    sacubitril-valsartan (ENTRESTO) 49-51 MG Take 1 tablet by mouth 2 (two) times daily.   spironolactone (ALDACTONE) 25 MG tablet Take 0.5 tablets (12.5 mg total) by mouth daily.   umeclidinium-vilanterol (ANORO ELLIPTA) 62.5-25 MCG/ACT AEPB Inhale 1 puff into the lungs daily.    Past Medical History  Past Surgical History:  Procedure Laterality Date   ABDOMINAL HYSTERECTOMY     CARDIAC CATHETERIZATION     CARDIAC CATHETERIZATION N/A 06/21/2016   Procedure: Left Heart Cath and Coronary Angiography;  Surgeon: Sherren Mocha, MD;  Location: [place] [facility];  Service: Cardiovascular;  Laterality: N/A;   CARDIAC DEFIBRILLATOR PLACEMENT  05/04/2007   SJM Atlas II VR ICD   CARDIAC DEFIBRILLATOR PLACEMENT     CATARACT EXTRACTION     od   COLONOSCOPY N/A 04/12/2013   Procedure: COLONOSCOPY;  Surgeon: Beryle Beams, MD;  Location: [facility];  Service: Endoscopy;  Laterality: N/A;  pt.has defibrilator   COLONOSCOPY N/A 04/16/2015   Procedure: COLONOSCOPY;  Surgeon: [name], MD;  Location: [facility];  Service: Endoscopy;  Laterality: N/A;   ESOPHAGOGASTRODUODENOSCOPY N/A 04/16/2015   Procedure: ESOPHAGOGASTRODUODENOSCOPY (EGD);  Surgeon: [name], MD;  Location: [facility];  Service: Endoscopy;  Laterality: N/A;   EYE SURGERY     HERNIA REPAIR     HYSTEROSCOPY     IMPLANTABLE CARDIOVERTER DEFIBRILLATOR (ICD) GENERATOR CHANGE N/A 04/02/2014   Procedure: ICD GENERATOR CHANGE;  Surgeon: Evans Lance, MD;  Location: [facility];  Service: Cardiovascular;  Laterality: N/A;   INSERT / REPLACE / REMOVE PACEMAKER     LEFT HEART CATHETERIZATION WITH CORONARY ANGIOGRAM N/A 04/02/2012   Procedure: LEFT HEART CATHETERIZATION WITH CORONARY ANGIOGRAM;  Surgeon: Hillary Bow, MD;  Location: [facility] CATH LAB;  Service: Cardiovascular;  Laterality: N/A;   ORIF ANKLE FRACTURE Right 08/12/2019   Procedure: OPEN REDUCTION INTERNAL FIXATION (ORIF) RIGHT TRIMALLEOLAR ANKLE FRACTURE;  Surgeon: Leandrew Koyanagi, MD;  Location: [place];  Service: Orthopedics;  Laterality: Right;   TONSILLECTOMY     TUBAL LIGATION       Social:  Lives With: alone but daughter lives nearby Occupation: retired Support: family, neighbor Level of Function: independent in ADLs and IADLs PCP: Dr Alton Revere Substances: She does smoke cigarettes. Has smoked about 0.5ppd for about 25 years. She is trying to quit and is down to about 1 cigarette a day now. Used to be a social alcohol user. No recreational drug use.     Family History:  Family History  Problem Relation Age of Onset   Stroke Mother    Seizures Father    Heart disease Father    Diabetes Sister    Asthma Maternal Aunt        aunts   Asthma Maternal Uncle  uncles   Heart disease Paternal Aunt        aunts   Heart disease Paternal Uncle        uncles   Heart disease Maternal Aunt        aunts   Heart disease Maternal Uncle        uncles   Heart disease Maternal Grandfather    Breast cancer Daughter    Breast cancer Cousin    Asthma Grandchild    Colon cancer Neg Hx    Colon polyps Neg Hx    Esophageal cancer Neg Hx    Kidney disease Neg Hx    Gallbladder disease Neg Hx       Allergies: Allergies as of 06/17/2022 - Review Complete 06/17/2022  Allergen Reaction Noted   Actos [pioglitazone] Other (See Comments) 03/12/2009   Naproxen Other (See Comments) 02/05/2016   Rosiglitazone Hives 02/11/2016   Tomato Itching 06/16/2021   Hydrocodone-acetaminophen Itching 08/08/2019   Hydrocortisone Hives, Itching, and Other (See Comments) 10/06/2009     Review of Systems: A complete ROS was negative except as per HPI.    OBJECTIVE:   Physical Exam: Blood pressure 105/73, pulse (!) 111, temperature (!) 101.1 F (38.4 C), temperature source Oral, resp. rate (!) 25, height '5\' 2"'$  (1.575 m), weight 81.6 kg, SpO2 98 %.   General:      awake and alert, lying comfortably in bed, cooperative, not in acute distress Mouth:  dry mucous  membranes.  Skin:       Warm and cracked skin, decreased skin turgor. Right lower extremity erythema, warmth, edema, and tenderness to palpation diffuse but most prominent on medial aspect below knee to malleolus, no purulence or open lesions. Lungs:      normal respiratory effort, breathing unlabored, symmetrical chest rise, course breath sounds bilaterally Cardiac:      regular rate and rhythm, normal S1 and S2, capillary refill 2-3 seconds, no pitting edema, no JVD Neurologic:      fully oriented to person-place-time, no gross focal deficits Psychiatric:      mood and affect normal, intelligible speech   Labs: CBC    Component Value Date/Time   WBC 13.9 (H) 06/17/2022 0300   RBC 4.96 06/17/2022 0300   HGB 13.0 06/17/2022 0300   HGB 14.1 02/08/2017 1551   HCT 40.1 06/17/2022 0300   HCT 44.5 02/08/2017 1551   PLT 203 06/17/2022 0300   PLT 246 02/08/2017 1551   MCV 80.8 06/17/2022 0300   MCV 87 02/08/2017 1551   MCH 26.2 06/17/2022 0300   MCHC 32.4 06/17/2022 0300   RDW 15.9 (H) 06/17/2022 0300   RDW 16.7 (H) 02/08/2017 1551   LYMPHSABS 1.3 06/17/2022 0300   LYMPHSABS 2.6 02/08/2017 1551   MONOABS 0.7 06/17/2022 0300   EOSABS 0.0 06/17/2022 0300   EOSABS 0.3 02/08/2017 1551   BASOSABS 0.0 06/17/2022 0300   BASOSABS 0.0 02/08/2017 1551     CMP     Component Value Date/Time   NA 139 06/17/2022 0300   NA 141 02/15/2022 1142   K 3.8 06/17/2022 0300   CL 102 06/17/2022 0300   CO2 24 06/17/2022 0300   GLUCOSE 251 (H) 06/17/2022 0300   BUN 13 06/17/2022 0300   BUN 19 02/15/2022 1142   CREATININE 1.53 (H) 06/17/2022 0300   CREATININE 0.88 06/16/2016 1338   CALCIUM 8.5 (L) 06/17/2022 0300   PROT 6.5 06/17/2022 0300   PROT 6.9 02/08/2017 1551   ALBUMIN 2.3 (L) 06/17/2022  0300   ALBUMIN 4.1 02/08/2017 1551   AST 31 06/17/2022 0300   ALT 20 06/17/2022 0300   ALKPHOS 182 (H) 06/17/2022 0300   BILITOT 2.1 (H) 06/17/2022 0300   BILITOT 0.4 02/08/2017 1551   GFRNONAA 37  (L) 06/17/2022 0300   GFRNONAA 75 02/13/2015 1015   GFRAA 78 07/14/2020 1536   GFRAA 87 02/13/2015 1015     Imaging: DG Tibia/Fibula Right  Result Date: 06/17/2022 CLINICAL DATA:  Questionable sepsis; evaluate for abnormality EXAM: RIGHT TIBIA AND FIBULA - 2 VIEW COMPARISON:  Radiographs 12/04/2019 FINDINGS: Plate and screw fixation of the distal right tibia and fibula. No radiographic evidence of loosening. No acute fracture or dislocation. Subcutaneous edema. IMPRESSION: No acute osseous abnormality. Electronically Signed   By: Placido Sou M.D.   On: 06/17/2022 03:09   DG Chest Port 1 View  Result Date: 06/17/2022 CLINICAL DATA:  Questionable sepsis; evaluate for abnormality EXAM: PORTABLE CHEST 1 VIEW COMPARISON:  Radiographs 06/14/2022 FINDINGS: No change from 06/14/2022.  Cardiomegaly.  ICD. Similar mildly increased interstitial opacities in the right upper lung. No focal consolidation, pleural effusion, or pneumothorax. No acute osseous abnormality. IMPRESSION: Interstitial prominence in the right upper lung may be due to asymmetric edema or atypical infection. Electronically Signed   By: Placido Sou M.D.   On: 06/17/2022 03:07      ECG: My personal interpretation is sinus tachycardia    ASSESSMENT & PLAN:   Assessment & Plan by Problem: Principal Problem:   Sepsis due to cellulitis [facility]) Active Problems:   Tobacco use disorder   Essential hypertension   ICD (implantable cardioverter-defibrillator) in place   Uncontrolled type 2 diabetes mellitus with hyperglycemia (HCC)   Restrictive lung disease   Chronic combined systolic and diastolic congestive heart failure ([place])   [name] is a 71 y.o. person living with a history of CHF post ICD, hypertension, restrictive lung disease, type II diabetes, and hyperlipidemia who presented with generalized weakness and admitted for weakness and fatigue who is now on hospital day 0    ---Sepsis secondary to nonpurulent  cellulitis of right lower extremity Patient reports weakness and fatigue that started about 4 days ago. Upon arrival to the ED, vitals notable for fever and tachycardia. Laboratory testing revealed elevated creatinine and lactate. On exam, patient has erythema, edema, warmth, and tenderness in her right lower extremity. Meeting sepsis criteria, she was given vancomycin and cefepime in the ED. Chest Xray was indeterminate for an infectious process, as she has history of restrictive lung disease, but no pulmonary symptoms. Blood cultures pending (1st set collected prior to antibiotics, 2nd set after). UA and urine cultures pending. Etiology is most likely secondary to cellulitis although awaiting UA to check for UTI. If UA negative for UTI, consider switching to cefazolin for nonpurulent cellulitis. Getting venous dopplers to rule out DVT. Looks dehydrated on exam so will provide IVF. > Cefepime 2g IV > Vancomycin 1g IV > Intravenous LR at 120m/hr for 8hr > Acetaminophen '650mg'$  q6 PRN > f/u venous dopplers > f/u UA, urine cultures, blood cultures, TSH > PT/OT eval   ---Acute kidney injury Likely prerenal for poor PO intake. Baseline Cr 0.8-1, on admission 1.53. Giving IVF for rehydration and will encourage PO intake. > IVF, encourage oral intake > avoid nephrotoxic medications > monitor UOP > Trend BMP  --Fall Patient reports slipping out of bed, no head trauma or LOC. Unknown downtime. Complaining of some red-tinged urine. -check CK and UA for myoglobinuria -  IVF   ---Chronic combined systolic and diastolic heart failure ---Non-ischemic cardiomyopathy post implantable cardiac defibrillator ---Hypertension Patient has a history of CHF dating back to at least 2003 per the electronic medical record. Most recent echocardiogram 06-2021 demonstrated EF 20-25% with severely dilated left atrium. She had a single-chamber ICD placed in 02-2011. Treatment team is currently holding home BP medications  given her borderline hypotension and apparent dehydration on exam. > Hold furosemide '40mg'$  q12 given dehydration > Hold metoprolol succinate '50mg'$  q24, sacubitril-valsartan 49-'51mg'$  q12 given low-normal BP > strict I/Os, daily weights   ---Restrictive lung disease Pulmonary function tests performed in 10-2018 were concerning for a restrictive process. Subsequent CT imaging revealed ground glass opacities and scattered mild interlobular septal thickening in both lungs, suggesting air trapping or hypersensitivity pneumonitis. On exam, course breath sounds were heard bilaterally. Patient denies any new respiratory symptoms. Chronically on 2L [place] at bedtime. > Albuterol 108ug q6 PRN > Umeclidinium-vilanterol 62.5-'25mg'$  q24   ---Type II diabetes Patient has a long history of diabetes. Most recent A1C 10.7 in 01-2022. She reports full adherence to her medication regimen.  > insulin degludec 12U q24, insulin lispro 6U q8, moderate SSI > trend CBGs > holding farxiga (given concern for possible UTI), and victoza   ---Hyperlipidemia Patient has a history of hyperlipidemia and takes atorvastatin. > Atorvastatin '80mg'$  q24  ---Tobacco use disorder Patient trying to quit, has successfully cut down to 1-2 cigarettes or less per day. > Encourage cessation  ---Psoriasiform dermatitis  Patient has psoriasiform dermatitis diagnosis managed with dupilumab '200mg'$  q336. Possible point of entry for cellulitis pathogens. > Continue to monitor    Diet:  HH/CM VTE: Enoxaparin IVF: LR,125cc/hr Code: Full  Prior to Admission Living Arrangement: Home, living alone Anticipated Discharge Location:  TBD Barriers to Discharge: continued medical management  Dispo: Admit patient to Observation with expected length of stay less than 2 midnights.  Signed: Virl Axe, MD Internal Medicine Resident PGY-1  06/17/2022, 7:01 AM

## 2022-06-17 NOTE — Progress Notes (Addendum)
Subjective:   Patient was somnolent during our initial interview this morning.  She was opening her eyes to verbal stimuli with short verbal responses. Patient would then fall back asleep.  11:00 AM  Upon reassessment the patient continues to be somnolent.  She will open her eyes to verbal stimuli and speaking in short sentences but then fall back asleep.  2:00 PM Upon reassessment the patient is much more alert than this morning.  She states that she is only having pain in her right foot.  She states that she is cold at the moment.  Discussed bringing her blanket.  Patient is hungry and would like to eat. She does not have her dentures with her at the moment and is unable to get them till later tonight or tomorrow. Will get her something softer to eat.    Objective:  Vital signs in last 24 hours: Vitals:   06/17/22 0600 06/17/22 0613 06/17/22 0922 06/17/22 1030  BP: 106/60  (!) 93/58 103/73  Pulse: 82  69 71  Resp: (!) 28  (!) 21 18  Temp:  97.9 F (36.6 C)    TempSrc:  Oral    SpO2: 100%  100% 100%  Weight:      Height:        Physical Exam: General: laying in bed,  HENT: two 1cm circular abrasions on the left lower forehead without active bleeding Mouth: dry mucous membranes.  Pulmonary: normal respiratory effort, course breath sounds bilaterally Cardiac: regular rate and rhythm, no murmurs, rubs, or gallops. 1+ LE edema bilaterally, no JVD Skin: Warm, dry cracked skin diffusely, decreased skin turgor. Right lower extremity with warmth and minimal erythema. Tenderness to palpation of the posterior aspect of the right lower extremity. Diffuse tenderness to palpation of the RLE most prominent on medial aspect below knee to malleolus, no abrasions of the lower extremities.  Neurologic: oriented to person, place, time, but not situation. No gross focal deficits Psychiatric: normal mood and affect, speaking softly and quietly  Assessment/Plan:  Principal Problem:   Sepsis  due to cellulitis [facility]) Active Problems:   ICD (implantable cardioverter-defibrillator) in place   Uncontrolled type 2 diabetes mellitus with hyperglycemia ([place])   Restrictive lung disease   Chronic combined systolic and diastolic congestive heart failure ([place])   Sepsis (HCC)  [name] is a 71 y/o female with past medical history of CHF s/p ICD, HTN, HLD, T2DM, and restrictive lung disease that  presents with generalized weakness and was admitted for somnolence and suspected sepsis.    # Sepsis secondary to [place] and GNR bacteremia Patient presented with 4 days of weakness and fatigue. Febrile and tachycardic on admission. Elevated lactic acid and leukocytosis on admission.  Also found to have right lower extremity with erythema, warmth, edema, and tenderness to palpation. Was started on vancomycin and cefepime in the ED.  Blood cultures positive for gram positive cocci and gram negative rods in both aerobic/anaerobic bottles. MRSA swab negative. Ultrasound of the right lower extremity negative for DVT.  Suspect symptoms are secondary to sepsis from RLE cellulitis. Has history of ORIF from right ankle fracture in 2020.  Complaining of pain in her right foot. Initially planned to order MRI of the right foot however the patient's pacemaker is not compatible with MRI.  Skin is diffusely dry and cracked. Initially somnolent this morning and intermittently hypotensive. Lactic acid remained elevated. Blood pressure and mentation did improve following 500 mL LR bolus. Currently alert and oriented to person,  place, time, but not situation. Must be judicious with IV fluids given EF 20-25%. Will continue to monitor her pressures closely given potential need for vasopressors.  - Acetaminophen '650mg'$  q6 PRN - Vancomycin 1g IV - Cefepime 2g IV - Pending TSH  2. # Acute kidney injury Baseline creatinine 0.8-1.  Creatinine elevated at 1.53 on admission. Likely prerenal given that patient appears dry on exam.  Patient was also complaining of red-tinged urine on admission. Urinalysis not concerning for UTI but small amount of Hgb present and no RBCs.  - Encourage PO  - Pending CK  - Pending urine culture - Trend BMP   3. # Fall Patient states that she slipped out of bed this morning. Denies head trauma or LOC. Abrasions without active bleeding noted on her left lower forehead. Given these findings and her somnolence CT head was ordered. CT head negative.  - PT/OT eval   4. # Chronic combined systolic and diastolic heart failure # Non-ischemic cardiomyopathy post implantable cardiac defibrillator # HTN Echo in 06/2021 demonstrated EF 20-25% with severely dilated LA. ICD placed in 02/2011. On furosemide '40mg'$  q12, metoprolol succinate '50mg'$  q24, and sacubitril-valsartan 49-'51mg'$  q12 at home. Holding BP medications currently due to borderline hypotension.  - Strict I/Os w/ daily weights   5. # Restrictive lung disease Restrictive findings on PFTs in 10/2018. On 2L [place] at home.  Patient was not complaining of difficulty breathing but course breath sounds were noted on exam bilaterally. CXR demonstrates interstitial prominence in the right upper lung that may be due to asymmetric edema or atypical infection. - Albuterol 108ug q6 PRN - Umeclidinium-vilanterol 62.5-'25mg'$  q24   6. # Type II diabetes A1C 10.7 in 01/2022.  > insulin degludec 12U q24, insulin lispro 6U q8, moderate SSI > trend CBGs > holding farxiga (given concern for possible UTI), and victoza   7. # Hyperlipidemia On atorvastatin. - Atorvastatin '80mg'$  q24   8. # Tobacco use disorder Smoking history and currently smoking.  - Encourage smoking cessation   9. # Psoriasiform dermatitis  On dupilumab '200mg'$  q336.  - Continue monitoring skin findings   Diet:  DYS 3 VTE: Enoxaparin IVF: LR  Code: Full  Prior to Admission Living Arrangement: home living alone Anticipated Discharge Location: TBD Barriers to Discharge: continued  management Dispo: Anticipated discharge in approximately more than 2 day(s).   [name], MD 06/17/2022, 12:10 PM Pager: [phone] After 5pm on weekdays and 1pm on weekends: On Call pager [phone]

## 2022-06-17 NOTE — Evaluation (Signed)
Occupational Therapy Evaluation Patient Details Name: [name] MRN: [id number] DOB: 1951/05/05 Today's Date: 06/17/2022   History of Present Illness 71 yo female admitted with sepsis due to cellulitis PMH ICD, DM2 restrictive lung disease CHF smoker   Clinical Impression   PT admitted with R LE cellulitis and sepsis. Pt currently with functional limitiations due to the deficits listed below (see OT problem list). Pt currently limited motion to R LE edema present and very painful with all tactile input. Pt noted to have blisters behind the knee. Pt benefits from use of toilet to help support it vs use of hands at calf. Pt to ask family to bring glasses and dentures for next session. RN notified need to have modified diet to smooth / soft textures as pt reports she is unable to eat the beef pot roast offered for lunch.  Pt will benefit from skilled OT to increase their independence and safety with adls and balance to allow discharge SNF pending progress.       Recommendations for follow up therapy are one component of a multi-disciplinary discharge planning process, led by the attending physician.  Recommendations may be updated based on patient status, additional functional criteria and insurance authorization.   Follow Up Recommendations  Skilled nursing-short term rehab (<3 hours/day)    Assistance Recommended at Discharge Intermittent Supervision/Assistance  Patient can return home with the following Two people to help with walking and/or transfers;Two people to help with bathing/dressing/bathroom    Functional Status Assessment  Patient has had a recent decline in their functional status and demonstrates the ability to make significant improvements in function in a reasonable and predictable amount of time.  Equipment Recommendations  Tub/shower seat    Recommendations for Other Services PT consult     Precautions / Restrictions Precautions Precautions: Fall       Mobility Bed Mobility Overal bed mobility: Needs Assistance Bed Mobility: Supine to Sit, Sit to Supine     Supine to sit: Max assist Sit to supine: Max assist   General bed mobility comments: total +2 max (A) to slide to [facility] with bed flat. pt requires (A) to bring BIL LE to eob . pt static sitting min guard. Pt tolerates R LE off the eob. pt grimacing and moaning with any tactile input to the R LE    Transfers                   General transfer comment: not attempted at this time. allowing pt to eat with (A) at eob. pt noted to have breakfast tray and lunch tray present. Hgb is 61      Balance Overall balance assessment: Mild deficits observed, not formally tested                                         ADL either performed or assessed with clinical judgement   ADL Overall ADL's : Needs assistance/impaired Eating/Feeding: Minimal assistance;Sitting Eating/Feeding Details (indicate cue type and reason): difficulty due to no dentures present Grooming: Wash/dry hands;Minimal assistance;Sitting Grooming Details (indicate cue type and reason): washing hands to eat                               General ADL Comments: extreme pain in R LE and unable to progress to standing.  Vision Baseline Vision/History: 1 Wears glasses Additional Comments: no glasses present     Perception     Praxis      Pertinent Vitals/Pain Pain Assessment Pain Assessment: No/denies pain     Hand Dominance Right   Extremity/Trunk Assessment Upper Extremity Assessment Upper Extremity Assessment: Overall WFL for tasks assessed   Lower Extremity Assessment Lower Extremity Assessment: RLE deficits/detail RLE Deficits / Details: very painful , weeping behind the knee noted to have blisters   Cervical / Trunk Assessment Cervical / Trunk Assessment: Kyphotic   Communication Communication Communication: No difficulties   Cognition Arousal/Alertness:  Awake/alert Behavior During Therapy: WFL for tasks assessed/performed Overall Cognitive Status: Within Functional Limits for tasks assessed                                       General Comments  edema bil LE    Exercises     Shoulder Instructions      Home Living Family/patient expects to be discharged to:: Private residence Living Arrangements: Alone Available Help at Discharge: Family;Available PRN/intermittently Type of Home: Apartment Home Access: Stairs to enter Entrance Stairs-Number of Steps: 3 Entrance Stairs-Rails: Right Home Layout: One level     Bathroom Shower/Tub: Teacher, early years/pre: Standard     Home Equipment: Cane - single point;Rollator (4 wheels);Toilet riser          Prior Functioning/Environment Prior Level of Function : Driving;Independent/Modified Independent                        OT Problem List: Decreased activity tolerance;Impaired balance (sitting and/or standing);Decreased safety awareness;Decreased knowledge of use of DME or AE;Decreased knowledge of precautions;Cardiopulmonary status limiting activity;Obesity;Increased edema      OT Treatment/Interventions: Self-care/ADL training;Therapeutic exercise;DME and/or AE instruction;Manual therapy;Modalities;Therapeutic activities;Patient/family education;Balance training    OT Goals(Current goals can be found in the care plan section) Acute Rehab OT Goals Patient Stated Goal: to get the R LE to hurt less OT Goal Formulation: With patient Time For Goal Achievement: 07/01/22 Potential to Achieve Goals: Good  OT Frequency: Min 2X/week    Co-evaluation              AM-PAC OT "6 Clicks" Daily Activity     Outcome Measure Help from another person eating meals?: A Little Help from another person taking care of personal grooming?: A Little Help from another person toileting, which includes using toliet, bedpan, or urinal?: A Lot Help from another  person bathing (including washing, rinsing, drying)?: A Lot Help from another person to put on and taking off regular upper body clothing?: A Little Help from another person to put on and taking off regular lower body clothing?: A Lot 6 Click Score: 15   End of Session Equipment Utilized During Treatment: Oxygen Nurse Communication: Mobility status;Precautions  Activity Tolerance: Patient tolerated treatment well Patient left: in bed;with call bell/phone within reach  OT Visit Diagnosis: Unsteadiness on feet (R26.81);Muscle weakness (generalized) (M62.81)                Time: 1914-7829 OT Time Calculation (min): 36 min Charges:  OT General Charges $OT Visit: 1 Visit OT Evaluation $OT Eval Moderate Complexity: 1 Mod OT Treatments $Self Care/Home Management : 8-22 mins   Brynn, OTR/L  Acute Rehabilitation Services Office: [phone] .   Jeri Modena 06/17/2022, 2:44 PM

## 2022-06-17 NOTE — Progress Notes (Signed)
Pharmacy Antibiotic Note  [name] is a 71 y.o. female admitted on 06/17/2022 with sepsis. Pharmacy has been consulted for vancomycin and cefepime dosing. Cr up from baseline.  Plan: Cefepime 2g IV q12h Vancomycin '1500mg'$  IV x1 then '1000mg'$  IV q48h - est AUC 445 Follow Cr, Cx, LOT Vancomycin levels as needed   Height: '5\' 2"'$  (157.5 cm) Weight: 81.6 kg (179 lb 14.3 oz) IBW/kg (Calculated) : 50.1  Temp (24hrs), Avg:101.1 F (38.4 C), Min:101.1 F (38.4 C), Max:101.1 F (38.4 C)  Recent Labs  Lab 06/14/22 1321 06/17/22 0300  WBC 7.1 13.9*  CREATININE 1.29*  --     Estimated Creatinine Clearance: 41.3 mL/min (A) (by C-G formula based on SCr of 1.29 mg/dL (H)).    Allergies  Allergen Reactions   Actos [Pioglitazone] Other (See Comments)    REACTION: congestive heart failure    Naproxen Other (See Comments)    '500mg'$  dose made her sleep for two days   Rosiglitazone Hives   Tomato Itching   Hydrocodone-Acetaminophen Itching   Hydrocortisone Hives, Itching and Other (See Comments)    unknown    Antimicrobials this admission: Cefepime 9/15 >> Vancomycin 9/15 >>  Microbiology results: pending  Thank you for allowing pharmacy to be a part of this patient's care.  Einar Grad 06/17/2022 3:41 AM

## 2022-06-17 NOTE — ED Triage Notes (Addendum)
Pt BIB EMS from home. Pt reports generalized weakness x1 week that has been getting worse. Pt was unable to get into bed today. Pt reports syncopal episode on Sunday, fell and hit head - abrasion to left temple. Pt has hx of CHF and supposed to be on O2 but was not wearing. Pt with some labored breathing. 92-93% on room air. EMS reports sinus arhythmia with HR 100-150s. BP 135/73 CBG 356

## 2022-06-17 NOTE — Progress Notes (Signed)
Right lower extremity venous duplex completed. Refer to "CV Proc" under chart review to view preliminary results.  06/17/2022 12:43 PM Kelby Aline., MHA, RVT, RDCS, RDMS

## 2022-06-17 NOTE — ED Provider Notes (Signed)
[facility] EMERGENCY DEPARTMENT Provider Note   CSN: [id number] Arrival date & time: 06/17/22  0142     History  Chief Complaint  Patient presents with   Generalized Weakness    [name] is a 71 y.o. female.  The history is provided by the patient.   Patient with extensive medical history including CHF with ICD in place, diabetes presents with generalized weakness.  Patient reports over the past several days she had increasing generalized weakness.  Tonight she woke up and was unable to get out of bed as she had no energy, she reports she slid into the floor without any trauma.  Patient had a recent syncopal episode and hit her head but is already been evaluated for that on September 12  Patient reports fatigue and mild cough.  She also reports decreased appetite.  No fevers or vomiting.  She also reports increased pain in her right leg  Patient reports using home oxygen as needed   Past Medical History:  Diagnosis Date   ATN (acute tubular necrosis) ([place]) 07/15/2014   Automatic implantable cardioverter-defibrillator in situ    Automatic implantable cardioverter-defibrillator in situ 05/04/2007   Qualifier: Diagnosis of  By: Isla Pence     Cardiac defibrillator in situ    Atlas II VR (S[facility]) implanted by Dr Lovena Le   CHF (congestive heart failure) ([place])    Chronic combined systolic and diastolic heart failure ([place])    a. EF 35-40% in past;  b. Echo 7/13:  EF 45-50%, Gr 2 diast dysfn, mild AI, mild MAC, trivial MR, mild LAE, PASP 47.   Chronic ulcer of leg ([place])    04-09-15 resolved-not a problem.   Colon polyps 04/12/2013   Rectosigmoid polyp   COPD (chronic obstructive pulmonary disease) (H[facility])    O2 at night   Depression    Diabetes mellitus    Diabetes mellitus, type 2 ([place]) 04/03/2012   HIGH RISK FEET.. Please have patient take shoes and socks off every visit for visual foot inspection.     Eczema    Elevated alkaline phosphatase level    GGT and  5'nucleotidase 8/13 normal   Health care maintenance 01/22/2013   Surgically absent cervix- no pap needed (Path report 07/2000: uterine body with attached bilateral  adnexa and separate cervix.)   History of oxygen administration    oxygen @ 2 l/m nasally bedtime 24/7   HTN (hypertension)    Hx of cardiac cath    a. [place] 2003 normal;  b. [facility] 6/13:  Mild calcification in the LM, o/w normal coronary arteries, EF 45%.    Hyperkalemia 08/08/2017   Hyperlipidemia    Elevated triglyceride in 2019   Hyperlipidemia associated with type 2 diabetes mellitus ([place]) 05/25/2007   Qualifier: Diagnosis of  By: Hassell Done FNP, [place]     Hyperthyroidism, subclinical    HYPERTHYROIDISM, SUBCLINICAL 05/06/2009   Qualifier: Diagnosis of  By: Darrick Meigs     Implantable cardioverter-defibrillator (ICD) generator end of life    Lipoma    Need for hepatitis C screening test 04/25/2019   NICM (nonischemic cardiomyopathy) ([place])    Obesity    On home oxygen therapy    "2L; 24/7" (10/11/2016)   Post menopausal syndrome    Shortness of breath 07/14/2017   Skin rash 07/12/2018   Sleep apnea    pt denies 04/12/2013    Home Medications Prior to Admission medications   Medication Sig Start Date End Date Taking? Authorizing Provider  albuterol (VENTOLIN HFA) 108 (90 Base) MCG/ACT inhaler Inhale 2 puffs into the lungs every 6 (six) hours as needed for wheezing or shortness of breath. 04/28/21  Yes Maudie Mercury, MD  aspirin EC 81 MG tablet Take 81 mg by mouth daily. Swallow whole.   Yes [provider]  atorvastatin (LIPITOR) 80 MG tablet Take 1 tablet (80 mg total) by mouth at bedtime. 06/24/21  Yes Maudie Mercury, MD  calcium-vitamin D (OSCAL WITH D) 500-200 MG-UNIT tablet Take 1 tablet by mouth 2 (two) times daily. 02/12/20  Yes Kathi Ludwig, MD  dapagliflozin propanediol (FARXIGA) 10 MG TABS tablet Take 1 tablet (10 mg total) by mouth daily. 05/02/22  Yes Gaylan Gerold, DO  dupilumab (DUPIXENT) 200  MG/1.14ML prefilled syringe Inject 200 mg into the skin every 14 (fourteen) days. Every other Friday   Yes [provider]  furosemide (LASIX) 40 MG tablet Take 1 tablet (40 mg total) by mouth 2 (two) times daily. 06/25/21  Yes Milford, Maricela Bo, FNP  insulin degludec (TRESIBA) 100 UNIT/ML FlexTouch Pen Inject 22 Units into the skin daily. 12/21/21  Yes Masters, Katie, DO  Insulin lispro (HUMALOG JUNIOR KWIKPEN) 100 UNIT/ML Inject 6 Units into the skin 3 (three) times daily. 12/21/21  Yes Masters, Katie, DO  liraglutide (VICTOZA) 18 MG/3ML SOPN Inject 1.65m daily into the skin 12/26/19  Yes Harbrecht, LPurcell Nails MD  metoprolol succinate (TOPROL-XL) 50 MG 24 hr tablet TAKE 1 TABLET(50 MG) BY MOUTH DAILY WITH OR IMMEDIATELY FOLLOWING A MEAL Patient taking differently: Take 50 mg by mouth daily. 06/24/21  Yes WMaudie Mercury MD  sacubitril-valsartan (ENTRESTO) 49-51 MG Take 1 tablet by mouth 2 (two) times daily. 01/21/22  Yes CMarianna Payment MD  spironolactone (ALDACTONE) 25 MG tablet Take 0.5 tablets (12.5 mg total) by mouth daily. 08/06/21  Yes Milford, Jessica M, FNP  umeclidinium-vilanterol (ANORO ELLIPTA) 62.5-25 MCG/ACT AEPB Inhale 1 puff into the lungs daily. 12/21/21  Yes Masters, Katie, DO  Blood Glucose Monitoring Suppl (ACCU-CHEK GUIDE ME) w/Device KIT by Does not apply route.    [provider]  Continuous Blood Gluc Receiver (DEXCOM G7 RECEIVER) DEVI Use with  Dexcom G7 sensor to monitor blood sugar continuously 03/17/22   NAldine Contes MD  Continuous Blood Gluc Sensor (DEXCOM G7 SENSOR) [place]anna[place] new sensor every 10 days and use to monitor blood sugar continuously 03/17/22   NAldine Contes MD  glucose blood (TRUE FOCUS BLOOD GLUCOSE STRIP) test strip Use to check blood sugar 3 times a day 11/23/21   ALacinda Axon MD  Insulin Pen Needle (PEN NEEDLES) 32G X 4 MM MISC Use to inject insulin 3 times times per day 12/21/21   Masters, KJoellen Jersey DO  Lancets Misc. (ACCU-CHEK  FASTCLIX LANCET) KIT Lancing device, use with lancets for blood glucose monitoring. 07/17/18   HKathi Ludwig MD  Lancets MISC 1 Act by Does not apply route 4 (four) times daily. 07/11/18   HKathi Ludwig MD      Allergies    Actos [pioglitazone], Naproxen, Rosiglitazone, Tomato, Hydrocodone-acetaminophen, and Hydrocortisone    Review of Systems   Review of Systems  Constitutional:  Positive for fatigue.  Respiratory:  Positive for cough.   Gastrointestinal:  Negative for diarrhea and vomiting.    Physical Exam Updated Vital Signs BP 105/73   Pulse (!) 111   Temp (!) 101.1 F (38.4 C) (Oral)   Resp (!) 25   Ht 1.575 m (_0 )   Wt 81.6 kg   SpO2 98%  BMI 32.90 kg/m  Physical Exam CONSTITUTIONAL: Elderly, chronically ill-appearing HEAD: Normocephalic/atraumatic EYES: EOMI/PERRL ENMT: Mucous membranes moist NECK: supple no meningeal signs SPINE/BACK:entire spine nontender CV: S1/S2 noted, tachycardic LUNGS: Coarse breath sounds bilaterally ABDOMEN: soft, nontender, no rebound or guarding GU:no cva tenderness NEURO: Pt is awake/alert/appropriate, moves all extremitiesx4.  Movement limited due to generalized fatigue EXTREMITIES: Distal pulses intact in lower extremities.  Right lower extremity is edematous, warm to touch and significant tenderness over the lower leg.  No crepitus.  No obvious deformities. SKIN: warm, see photo PSYCH: no abnormalities of mood noted, alert and oriented to situation   ED Results / Procedures / Treatments   Labs (all labs ordered are listed, but only abnormal results are displayed) Labs Reviewed  LACTIC ACID, PLASMA - Abnormal; Notable for the following components:      Result Value   Lactic Acid, Venous 2.6 (*)    All other components within normal limits  COMPREHENSIVE METABOLIC PANEL - Abnormal; Notable for the following components:   Glucose, Bld 251 (*)    Creatinine, Ser 1.53 (*)    Calcium 8.5 (*)    Albumin 2.3 (*)     Alkaline Phosphatase 182 (*)    Total Bilirubin 2.1 (*)    GFR, Estimated 37 (*)    All other components within normal limits  CBC WITH DIFFERENTIAL/PLATELET - Abnormal; Notable for the following components:   WBC 13.9 (*)    RDW 15.9 (*)    Neutro Abs 11.8 (*)    All other components within normal limits  PROTIME-INR - Abnormal; Notable for the following components:   Prothrombin Time 17.1 (*)    INR 1.4 (*)    All other components within normal limits  CULTURE, BLOOD (ROUTINE X 2)  CULTURE, BLOOD (ROUTINE X 2)  URINE CULTURE  SARS CORONAVIRUS 2 BY RT PCR  APTT  LACTIC ACID, PLASMA  URINALYSIS, ROUTINE W REFLEX MICROSCOPIC    EKG ED ECG REPORT   Date: 06/17/2022 0229  Rate: 124  Rhythm: sinus tachycardia  QRS Axis: right  Intervals: normal  ST/T Wave abnormalities: nonspecific ST changes  Conduction Disutrbances:none  Narrative Interpretation:   Old EKG Reviewed: unchanged Artifact noted I have personally reviewed the EKG tracing and agree with the computerized printout as noted.  Radiology DG Tibia/Fibula Right  Result Date: 06/17/2022 CLINICAL DATA:  Questionable sepsis; evaluate for abnormality EXAM: RIGHT TIBIA AND FIBULA - 2 VIEW COMPARISON:  Radiographs 12/04/2019 FINDINGS: Plate and screw fixation of the distal right tibia and fibula. No radiographic evidence of loosening. No acute fracture or dislocation. Subcutaneous edema. IMPRESSION: No acute osseous abnormality. Electronically Signed   By: Placido Sou M.D.   On: 06/17/2022 03:09   DG Chest Port 1 View  Result Date: 06/17/2022 CLINICAL DATA:  Questionable sepsis; evaluate for abnormality EXAM: PORTABLE CHEST 1 VIEW COMPARISON:  Radiographs 06/14/2022 FINDINGS: No change from 06/14/2022.  Cardiomegaly.  ICD. Similar mildly increased interstitial opacities in the right upper lung. No focal consolidation, pleural effusion, or pneumothorax. No acute osseous abnormality. IMPRESSION: Interstitial prominence in  the right upper lung may be due to asymmetric edema or atypical infection. Electronically Signed   By: Placido Sou M.D.   On: 06/17/2022 03:07    Procedures .Critical Care  Performed by: Ripley Fraise, MD Authorized by: Ripley Fraise, MD   Critical care provider statement:    Critical care time (minutes):  80   Critical care start time:  06/17/2022 2:30 AM   Critical  care end time:  06/17/2022 3:50 AM   Critical care time was exclusive of:  Separately billable procedures and treating other patients   Critical care was necessary to treat or prevent imminent or life-threatening deterioration of the following conditions:  Sepsis, shock and dehydration   Critical care was time spent personally by me on the following activities:  Obtaining history from patient or surrogate, examination of patient, evaluation of patient's response to treatment, development of treatment plan with patient or surrogate, re-evaluation of patient's condition, pulse oximetry, ordering and review of laboratory studies, ordering and review of radiographic studies and ordering and performing treatments and interventions   I assumed direction of critical care for this patient from another provider in my specialty: no     Care discussed with: admitting provider       Medications Ordered in ED Medications  lactated ringers infusion (has no administration in time range)  vancomycin (VANCOREADY) IVPB 1500 mg/300 mL (1,500 mg Intravenous New Bag/Given 06/17/22 0437)  ceFEPIme (MAXIPIME) 2 g in sodium chloride 0.9 % 100 mL IVPB (has no administration in time range)  vancomycin (VANCOCIN) IVPB 1000 mg/200 mL premix (has no administration in time range)  acetaminophen (TYLENOL) tablet 650 mg (650 mg Oral Given 06/17/22 0401)  lactated ringers bolus 1,000 mL (1,000 mLs Intravenous New Bag/Given 06/17/22 0433)  ceFEPIme (MAXIPIME) 2 g in sodium chloride 0.9 % 100 mL IVPB (0 g Intravenous Stopped 06/17/22 0433)    ED Course/  Medical Decision Making/ A&P Clinical Course as of 06/17/22 0504  Fri Jun 17, 2022  0220 Patient presents for generalized weakness.  She was found to be febrile and tachycardic.  Patient is currently on oxygen though she uses this at home.  Unclear source at this time, however she does have a warm tender right lower extremity.  Sepsis work-up has been initiated [DW]  0332 WBC(!): 13.9 Leukocytosis [DW]  0403 Creatinine(!): 1.53 hyperglycemia [DW]  0404 Creatinine(!): 1.53 Acute kidney injury [DW]  0432 Patient resting comfortably, no new complaints.  Labs are pending at this time [DW]  0503 Discussed with internal medicine service for admission.  IV fluids and antibiotics have been given.  Patient stabilized in the ER.  Urinalysis pending [DW]    Clinical Course User Index [DW] Ripley Fraise, MD                           Medical Decision Making Amount and/or Complexity of Data Reviewed Labs: ordered. Decision-making details documented in ED Course. Radiology: ordered. ECG/medicine tests: ordered.  Risk OTC drugs. Prescription drug management. Decision regarding hospitalization.   This patient presents to the ED for concern of generalized weakness, this involves an extensive number of treatment options, and is a complaint that carries with it a high risk of complications and morbidity.  The differential diagnosis includes but is not limited to UTI, sepsis, acute coronary syndrome, electrolyte abnormality, anemia  Comorbidities that complicate the patient evaluation: Patient's presentation is complicated by their history of diabetes, heart failure, obesity  Social Determinants of Health: Patient's  continued tobacco use   increases the complexity of managing their presentation  Additional history obtained: Records reviewed Primary Care Documents  Lab Tests: I Ordered, and personally interpreted labs.  The pertinent results include: Leukocytosis, acute kidney  injury  Imaging Studies ordered: I ordered imaging studies including X-ray chest and right tib-fib   I independently visualized and interpreted imaging which showed no obvious pneumonia I  agree with the radiologist interpretation  Cardiac Monitoring: The patient was maintained on a cardiac monitor.  I personally viewed and interpreted the cardiac monitor which showed an underlying rhythm of:  sinus tachycardia  Medicines ordered and prescription drug management: I ordered medication including IV fluids and antibiotics for presumed sepsis Reevaluation of the patient after these medicines showed that the patient    improved   Critical Interventions:  IV fluids and antibiotics  Consultations Obtained: I requested consultation with the admitting physician internal medicine residency service , and discussed  findings as well as pertinent plan - they recommend: will admit  Reevaluation: After the interventions noted above, I reevaluated the patient and found that they have :improved  Complexity of problems addressed: Patient's presentation is most consistent with  acute presentation with potential threat to life or bodily function  Disposition: After consideration of the diagnostic results and the patient's response to treatment,  I feel that the patent would benefit from admission   .           Final Clinical Impression(s) / ED Diagnoses Final diagnoses:  Cellulitis of right lower extremity  AKI (acute kidney injury) [facility])    Rx / DC Orders ED Discharge Orders     None         Ripley Fraise, MD 06/17/22 [phone]

## 2022-06-17 NOTE — Hospital Course (Addendum)
CHF, obesity, HTN  Generalized weakness, not feeling well  Tachycardic, febrile, hypertensive  Lactate 2.6 WBC 13 AKI  Pain, warmth, tenderness, swelling RLE Pending UA  On cefepime, vanc 1L LR  ___________________________________________________ [name] is a 71 y/o female with past medical history of CHF s/p ICD, HTN, HLD, T2DM, and restrictive lung disease that  presents with generalized weakness and was admitted for somnolence and suspected sepsis.    # Sepsis secondary to [place] and GNR bacteremia Patient presented with 4 days of weakness and fatigue. Febrile and tachycardic on admission. Elevated lactic acid and leukocytosis on admission.  Also found to have right lower extremity with erythema, warmth, edema, and tenderness to palpation. Was started on vancomycin and cefepime in the ED. Blood cultures positive for Strep G. MRSA swab negative. Ultrasound of the right lower extremity negative for DVT.  Suspected symptoms were secondary to sepsis from RLE cellulitis. Has history of ORIF from right ankle fracture in 2020.  Also presented with pain in her right foot. Initially planned to order MRI of the right foot however the patient's pacemaker is not compatible with MRI. Patient was initially somnolent this and intermittently hypotensive. Blood pressure and mentation improved following 500 mL LR bolus. Vancomycin stopped on 9/16. Finished 3-day course of Cefepime on 9/17. Started Cefazolin on 9/17 after sensitivities. * total days of antibiotics to date. Lactic acidosis resolved. Repeat blood cultures negative. TEE was negative for vegetations. Erythema resolved. Still with some soreness of the right ankle but able to get up and walk with mobility specialists. Currently alert and oriented to person, place, time. *Discharged with prescription for.  2. # Acute kidney injury Baseline creatinine 0.8-1.  Creatinine elevated at 1.53 on admission. Likely prerenal given that patient appeared dry on  exam. Patient was also complaining of red-tinged urine on admission. Urinalysis not concerning for UTI but small amount of Hgb present and no RBCs. CK mildly elevated but trended downward. Encouraged PO intake. Held entresto and spironolactone. Kidney function continued to improve.    3. # Fall Patient states that she slipped out of bed the morning of her admission. Denies head trauma or LOC. Abrasions without active bleeding were noted on her left lower forehead. Given these findings and her somnolence CT head was ordered. CT head negative.    4. # Chronic combined systolic and diastolic heart failure # Non-ischemic cardiomyopathy post implantable cardiac defibrillator # HTN Echo in 06/2021 demonstrated EF 20-25% with severely dilated LA. ICD placed in 02/2011. On furosemide '40mg'$  BID, metoprolol succinate '50mg'$ , sacubitril-valsartan 49-'51mg'$  BID, farxiga, and spironolactone 12.'5mg'$  at home. Initially held BP medications due to low-normal BP. Restarted metoprolol. *Discharged on.    5. # Restrictive lung disease Restrictive findings on PFTs in 10/2018. On 2L [place] at home. Patient was not complaining of difficulty breathing but course breath sounds bilaterally were noted on admission. CXR demonstrated interstitial prominence in the right upper lung that may be due to asymmetric edema or atypical infection. Leukocytosis resolved. Lung sounds improved with Albuterol 108ug q6 PRN and Umeclidinium-vilanterol 62.5-'25mg'$  q24. On RA on day of discharge. *Discharged on.    6. # Type II diabetes A1C 10.7 in 01/2022.  Takes tresiba 22u daily, lispro 6u 3X daily, farxiga, and victoza at home. Insulin was held for low blood glucose. TSH WNL. Cortisol and ACTH stimulation test not concerning for adrenal insufficiency. *Discharged on.    7. # Hyperlipidemia On atorvastatin and sent home on this medication.    8. # Tobacco  use disorder Smoking history and currently smoking. Encouraged smoking cessation.    9. #  Psoriasiform dermatitis  On dupilumab '200mg'$  q336 at home. Sent home on this medication.   --------------------------------------------------------------------------------------  Stayed in bed all day yesterday. Has been sleeping a lot, tired, fatigue. Around 1AM, she got up to fix a sandwich but slipped on to the floor. She was unable to get up and thus laid on the floor until she fell asleep. Her neighbor came in and called the EMS. No LOC or head trauma.  States she stays cold. Denies myalgias, fevers, cough, diarrhea, constipation, burning with urination, pain with urination, urinary frequency. States her urine is red-tinged, which began about a week ago.   Last full meal was about 2 weeks ago. She has been eating some canned sausage every other day or so but otherwise just eats some snacks. She has been drinking about 3-4 (16oz) bottle waters daily.  Does have swelling in her R leg since about Monday or Tuesday. It has been painful since this time. Has not noticed any redness or drainage from that area.  She wears 2L oxygen at night time. Has some SHOB when she does not wear her oxygen. This is not new for her since she was diagnosed with lung disease. Sees a lung doctor for this.   Lives by herself. Her daughter lives nearby but works all the time as a Marine scientist. She is retired. She is independent in ADLs and iADLs.  She does smoke cigarettes. Has smoked about 1/2 pack/day for about 25 years. She is trying to quit and is down to about 1 cigarette a day now. Used to be a social alcohol user. No recreational drug use. ___________________________________________________

## 2022-06-17 NOTE — Progress Notes (Addendum)
   I re-visited Ms. Cannell this morning for repeat exam. Now she is more somnolent, only withdrawing to pain, does not follow instructions or answer questions. She is now hypotensive with BP of 88/55, MAP 64, HR 70. She is saturating well on 2L of [place]. Because her extremities are still warm to touch, I still favor a distributive shock most likely due to sepsis. She received 1L of LR last night at 0430. On POCUS, she has severely reduced LVEF which is a chronic issue for her. Her IVC is mildly dilated with about 30% respiratory phasic variation. She has no appreciable JVD on exam. She is at risk for quickly becoming overloaded during volume resuscitation for sepsis. We will give a 500 mL bolus of LR now given the hypotension and declining mentation. This will complete a 20 cc/kg fluid challenge, and if hypotension does not improve after these fluids, I would favor vasopressor support.  She did received antibiotics with vanc/cefepime at 0400.   Additionally, ecchymosis noted on her left eyebrow, likely a result of the fall at home. We will get a CT head to rule out SDH. Phlebotomy unable to obtain labs due to hard stick, she has two PIVs but neither draw back for labs. I would like to recheck a lactic acid in about 30 minutes after IV fluids, will need an IV team consult for a new PIV, or may require central line access. Will check on the patient in about 30 minutes to see how hypotension responds to fluids.   Update: On re-evaluation the patient is more awake. Her BP is 108/77. She has responded well to the small fluid bolus. Her IVC is still mildly distended at 1.8cm, there is now zero respiratory variation. Will hold further fluids for now. She has completed the 20 cc/kg fluid challenge. If she has further hypotension later today, can try another small bolus of IV fluids, but will probably need to start pressor support as well. Will recheck lactic acid, I anticipate it will be slightly higher, and renal function.  CT head is without bleeding.  Axel Filler, MD 06/17/2022, 10:23 AM

## 2022-06-17 NOTE — Sepsis Progress Note (Signed)
Monitoring for the code sepsis protocol. °

## 2022-06-17 NOTE — Progress Notes (Signed)
Heart Failure Navigator Progress Note  Assessed for Heart & Vascular TOC clinic readiness.  Patient does not meet criteria due to Advanced Heart Failure Team..     Earnestine Leys, BSN, RN Heart Failure Nurse Navigator Secure Chat Only

## 2022-06-17 NOTE — ED Notes (Signed)
MD notified of pt HR and of pt temp

## 2022-06-17 NOTE — ED Notes (Signed)
VAS at bedside.

## 2022-06-18 DIAGNOSIS — A419 Sepsis, unspecified organism: Secondary | ICD-10-CM | POA: Diagnosis not present

## 2022-06-18 DIAGNOSIS — L0390 Cellulitis, unspecified: Secondary | ICD-10-CM | POA: Diagnosis not present

## 2022-06-18 LAB — COMPREHENSIVE METABOLIC PANEL
ALT: 23 U/L (ref 0–44)
AST: 37 U/L (ref 15–41)
Albumin: 2 g/dL — ABNORMAL LOW (ref 3.5–5.0)
Alkaline Phosphatase: 164 U/L — ABNORMAL HIGH (ref 38–126)
Anion gap: 14 (ref 5–15)
BUN: 20 mg/dL (ref 8–23)
CO2: 23 mmol/L (ref 22–32)
Calcium: 8.2 mg/dL — ABNORMAL LOW (ref 8.9–10.3)
Chloride: 104 mmol/L (ref 98–111)
Creatinine, Ser: 1.28 mg/dL — ABNORMAL HIGH (ref 0.44–1.00)
GFR, Estimated: 46 mL/min — ABNORMAL LOW (ref >60)
Glucose, Bld: 194 mg/dL — ABNORMAL HIGH (ref 70–99)
Potassium: 4.2 mmol/L (ref 3.5–5.1)
Sodium: 141 mmol/L (ref 135–145)
Total Bilirubin: 1 mg/dL (ref 0.3–1.2)
Total Protein: 5.9 g/dL — ABNORMAL LOW (ref 6.5–8.1)

## 2022-06-18 LAB — GLUCOSE, CAPILLARY
Glucose-Capillary: 79 mg/dL (ref 70–99)
Glucose-Capillary: 76 mg/dL (ref 70–99)
Glucose-Capillary: 166 mg/dL — ABNORMAL HIGH (ref 70–99)
Glucose-Capillary: 47 mg/dL — ABNORMAL LOW (ref 70–99)
Glucose-Capillary: 47 mg/dL — ABNORMAL LOW (ref 70–99)
Glucose-Capillary: 52 mg/dL — ABNORMAL LOW (ref 70–99)
Glucose-Capillary: 52 mg/dL — ABNORMAL LOW (ref 70–99)
Glucose-Capillary: 75 mg/dL (ref 70–99)

## 2022-06-18 LAB — TSH: TSH: 2.114 u[IU]/mL (ref 0.350–4.500)

## 2022-06-18 LAB — CBC
HCT: 38.1 % (ref 36.0–46.0)
Hemoglobin: 12.2 g/dL (ref 12.0–15.0)
MCH: 26.3 pg (ref 26.0–34.0)
MCHC: 32 g/dL (ref 30.0–36.0)
MCV: 82.1 fL (ref 80.0–100.0)
Platelets: 135 10*3/uL — ABNORMAL LOW (ref 150–400)
RBC: 4.64 MIL/uL (ref 3.87–5.11)
RDW: 16.3 % — ABNORMAL HIGH (ref 11.5–15.5)
WBC: 13.1 10*3/uL — ABNORMAL HIGH (ref 4.0–10.5)
nRBC: 0 % (ref 0.0–0.2)

## 2022-06-18 LAB — LACTIC ACID, PLASMA: Lactic Acid, Venous: 2.2 mmol/L (ref 0.5–1.9)

## 2022-06-18 LAB — CK: Total CK: 252 U/L — ABNORMAL HIGH (ref 38–234)

## 2022-06-18 MED ORDER — LACTATED RINGERS IV BOLUS
250.0000 mL | Freq: Once | INTRAVENOUS | Status: AC
  Administered 2022-06-18: 250 mL via INTRAVENOUS

## 2022-06-18 NOTE — TOC Initial Note (Signed)
Transition of Care [facility]) - Initial/Assessment Note    Patient Details  Name: [name] MRN: [id number] Date of Birth: Apr 11, 1951  Transition of [facility]) CM/SW Contact:    Benard Halsted, LCSW Phone Number: 06/18/2022, 10:44 AM  Clinical Narrative:                 CSW received consult for possible SNF at time of discharge. CSW spoke with patient. Patient adamantly reported that she does not want to go to SNF but would accept home health services. She stated she has a car but the doctor has told her not to drive. CSW asked if anyone would be able to assist her in the home as she lives alone and she stated she will make arrangements for someone to assist her. CSW discussed equipment needs with patient and she stated the only thing she does not have but needs is a tub bench. CSW confirmed PCP and address with patient and will request RNCM assistance for possible home services.   Expected Discharge Plan: [place] Barriers to Discharge: Continued Medical Work up   Patient Goals and CMS Choice Patient states their goals for this hospitalization and ongoing recovery are:: Rehab CMS Medicare.gov Compare Post Acute Care list provided to:: Patient Choice offered to / list presented to : Patient  Expected Discharge Plan and Services Expected Discharge Plan: [place] In-house Referral: Clinical Social Work   Post Acute Care Choice: Home Health, Durable Medical Equipment Living arrangements for the past 2 months: Apartment                                      Prior Living Arrangements/Services Living arrangements for the past 2 months: Apartment Lives with:: Self Patient language and need for interpreter reviewed:: Yes Do you feel safe going back to the place where you live?: Yes      Need for Family Participation in Patient Care: No (Comment) Care giver support system in place?: Yes (comment) Current home services: DME (all DME except tub  bench) Criminal Activity/Legal Involvement Pertinent to Current Situation/Hospitalization: No - Comment as needed  Activities of Daily Living      Permission Sought/Granted Permission sought to share information with : Facility Sport and exercise psychologist, Family Supports Permission granted to share information with : Yes, Verbal Permission Granted     Permission granted to share info w AGENCY: HH        Emotional Assessment Appearance:: Appears stated age Attitude/Demeanor/Rapport: Engaged Affect (typically observed): Accepting, Appropriate Orientation: : Oriented to Self, Oriented to Place, Oriented to  Time, Oriented to Situation Alcohol / Substance Use: Not Applicable Psych Involvement: No (comment)  Admission diagnosis:  Cellulitis of right lower extremity [L03.115] AKI (acute kidney injury) ([place]) [N17.9] Sepsis ([place]) [A41.9] Patient Active Problem List   Diagnosis Date Noted   Sepsis due to cellulitis ([place]) 06/17/2022   Sepsis ([place]) 06/17/2022   'light-for-dates' infant with signs of fetal malnutrition 02/15/2022   CHF exacerbation ([place]) 02/09/2022   Hepatic steatosis 07/28/2020   Hyperlipidemia    Osteoporosis 02/12/2020   Healthcare maintenance 12/26/2019   Intrinsic atopic dermatitis 09/19/2019   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, [place] [address] AT [facility] [address]  [place] [address] Phone: [phone] Fax: [place] [address] [place] [address] Phone: [phone] Fax: [phone]     Social Determinants of Health (SDOH) Interventions    Readmission Risk Interventions     No data to display

## 2022-06-18 NOTE — Progress Notes (Signed)
Pt's CBG was 49, rechecked and 52.  Hypoglycemic order activated.  Pt was given  8 oz of orange juice, to recheck CBG after 15 minutes.  Rechecked 47, gave Pt. Apple juice with sugar and ginger ale. Pt sugar recheck and CBG 52, she has cracker and continue to work on ginger ale.  Rechecked and CBG is 75.  Pt instructed to continue to drink the regular ginger ale tonight, as given to her, so her sugar will not drop tonight, MD team notified of low sugar.. advised to give long acting insulin.

## 2022-06-18 NOTE — Progress Notes (Addendum)
Subjective:   Patient examined at bedside. She is awake and alert this morning. Continues ot have right ankle and leg pain. Also feels legs are more swollen today. States she had french toast for breakfast, but having difficulty without her dentures. Her friend will bring them to the hospital later today.   Objective:  Vital signs in last 24 hours: Vitals:   06/18/22 0013 06/18/22 0400 06/18/22 0443 06/18/22 0615  BP: 99/84 (!) 94/57 (!) 92/58   Pulse: 70 81    Resp: 20 18    Temp: 97.7 F (36.5 C) 98.1 F (36.7 C)    TempSrc: Oral Oral    SpO2: 94% 94%    Weight:    89.3 kg  Height:       Physical Exam: General: Reclining in bed, in no acute distress  HENT: sub centimeter abrasions of the left eyebrow without bleeding Pulmonary: normal respiratory effort, no crackles or wheezing Cardiac: regular rate and rhythm, no appreciable JVD, 1+ pitting edema bilaterally Skin: chronic venous stasis changes of BLE, right leg is erythematous  MSK: Pain with any ROM of the right ankle Neurologic: Aox3, maintaining attention, speech is clear   Assessment/Plan:  Principal Problem:   Sepsis due to cellulitis (HCC) Active Problems:   ICD (implantable cardioverter-defibrillator) in place   Uncontrolled type 2 diabetes mellitus with hyperglycemia (HCC)   Restrictive lung disease   Chronic combined systolic and diastolic congestive heart failure ([place])   Sepsis (HCC)  [name] is a 71 y/o female with past medical history of HFrEF s/p ICD,  restrictive lung disease, HTN, HLD, and T2DM who was admitted for sepsis due to strep bacteremia.   Sepsis secondary to [place] bacteremia  Sepsis likely due to bacteremia from RLE cellulitis vs deep tissue infection.  Has history of ORIF from right ankle fracture in 2020. She could have have infection of Single set of cultures with gram positive rods in chains. BCID showing strep. MRSA swab negative.  Continues to have significant pain with right ankle  movement. Mentation and BP now improved with IVF.  - discontinue Vancomycin 1g IV - continue Cefepime, de-escalate pending improvement in right leg pain and speciation/sensitivities of blood culture - tylenol as needed for fever - follow blood cultures - monitor CBC, fever curve  Acute kidney injury Cr improved to 1.28 from 1.53 with IVF. Baseline creatinine 0.8-1.  - monitor BMP -holding entresto and spironolactone    Non-ischemic cardiomyopathy post implantable cardiac defibrillator HTN Echo in 06/2021 demonstrated EF 20-25% with severely dilated LA. ICD placed in 02/2011. Home medications furosemide '40mg'$  twice daily metoprolol succinate '50mg'$ , sacubitril-valsartan 49-'51mg'$  twice daily, farxiga, and spironolactone 12.'5mg'$  daily. Appears euvolemic today - hold lasix, monitor volume status - BP still low today, AKI improving  hold entresto and spironolactone - Restart metoprolol tomorrow  - Strict I/Os w/ daily weights   Fall Mentation much improved, otherwise not having significant pain from fall. - PT/OT  - may benefit from subacute rehab after discharge  Restrictive lung disease Restrictive findings on PFTs in 10/2018. On 2L [place] at home.  Patient was not complaining of difficulty breathing but course breath sounds were noted on exam bilaterally. CXR demonstrates interstitial prominence in the right upper lung that may be due to asymmetric edema or atypical infection. - Albuterol 108ug q6 PRN - Umeclidinium-vilanterol 62.5-'25mg'$  q24   Type II diabetes A1C 10.7 in 01/2022. Home medication tresiba 22u daily, lispro 6u three time daily, farxiga and victoza. Friend to bring  dentures so can eat more. - semglee 22 units daily, aspart 6 units with meals  - SSI   Diet:  DYS 3 VTE: Enoxaparin IVF: LR  Code: Full  Prior to Admission Living Arrangement: home living alone Anticipated Discharge Location: TBD Barriers to Discharge: continued management Dispo: Anticipated discharge in  approximately more than 2 day(s).   Iona Beard, MD 06/18/2022, 6:25 AM Pager: [phone] After 5pm on weekdays and 1pm on weekends: On Call pager [phone]

## 2022-06-18 NOTE — Progress Notes (Signed)
Mobility Specialist Progress Note    06/18/22 1724  Mobility  Activity Transferred from chair to bed  Level of Assistance Minimal assist, patient does 75% or more  Assistive Device Front wheel walker  Distance Ambulated (ft) 2 ft  Activity Response Tolerated well  $Mobility charge 1 Mobility   Left with call bell in reach.   Hildred Alamin Mobility Specialist

## 2022-06-18 NOTE — Evaluation (Signed)
Physical Therapy Evaluation Patient Details Name: [name] MRN: [id number] DOB: 04/18/1951 Today's Date: 06/18/2022  History of Present Illness  71 yo female admitted with sepsis due to cellulitis PMH ICD, DM2 restrictive lung disease CHF smoker  Clinical Impression  PT pleasant and reports living alone, caring for herself and having someone assist with transportation. Pt with decreased gait, strength and function limited by pain and weakness. Pt will benefit from acute therapy to maximize mobility, safety and function to decrease burden of care.   HR 80-93       Recommendations for follow up therapy are one component of a multi-disciplinary discharge planning process, led by the attending physician.  Recommendations may be updated based on patient status, additional functional criteria and insurance authorization.  Follow Up Recommendations Home health PT      Assistance Recommended at Discharge Frequent or constant Supervision/Assistance  Patient can return home with the following  A little help with walking and/or transfers;A little help with bathing/dressing/bathroom;Assistance with cooking/housework;Assist for transportation;Help with stairs or ramp for entrance    Equipment Recommendations None recommended by PT  Recommendations for Other Services       Functional Status Assessment Patient has had a recent decline in their functional status and demonstrates the ability to make significant improvements in function in a reasonable and predictable amount of time.     Precautions / Restrictions Precautions Precautions: Fall      Mobility  Bed Mobility               General bed mobility comments: in chair on arrival    Transfers Overall transfer level: Needs assistance   Transfers: Sit to/from Stand Sit to Stand: Min assist           General transfer comment: min assist to rise from chair and lowered without assist    Ambulation/Gait Ambulation/Gait  assistance: Min guard Gait Distance (Feet): 30 Feet Assistive device: Rolling walker (2 wheels) Gait Pattern/deviations: Step-through pattern, Decreased stride length, Wide base of support, Trunk flexed   Gait velocity interpretation: <1.8 ft/sec, indicate of risk for recurrent falls   General Gait Details: pt with cues for posture, proximity to RW and safety with decreased activity tolerance  Stairs            Wheelchair Mobility    Modified Rankin (Stroke Patients Only)       Balance Overall balance assessment: Mild deficits observed, not formally tested                                           Pertinent Vitals/Pain Pain Assessment Pain Assessment: 0-10 Pain Score: 3  Pain Location: RLE Pain Descriptors / Indicators: Aching, Sore Pain Intervention(s): Limited activity within patient's tolerance, Monitored during session, Repositioned    Home Living Family/patient expects to be discharged to:: Private residence Living Arrangements: Alone Available Help at Discharge: Family;Available PRN/intermittently Type of Home: Apartment Home Access: Stairs to enter Entrance Stairs-Rails: Right Entrance Stairs-Number of Steps: 3   Home Layout: One level Home Equipment: Cane - single point;Rollator (4 wheels);Toilet riser      Prior Function Prior Level of Function : Driving;Independent/Modified Independent             Mobility Comments: walks with rollator sometimes, drives       Hand Dominance        Extremity/Trunk Assessment   Upper  Extremity Assessment Upper Extremity Assessment: Generalized weakness    Lower Extremity Assessment Lower Extremity Assessment: Generalized weakness RLE Deficits / Details: cellulitis with weeping legs, increased edema    Cervical / Trunk Assessment Cervical / Trunk Assessment: Kyphotic  Communication   Communication: No difficulties  Cognition Arousal/Alertness: Awake/alert Behavior During Therapy:  WFL for tasks assessed/performed Overall Cognitive Status: Within Functional Limits for tasks assessed                                          General Comments      Exercises General Exercises - Lower Extremity Long Arc Quad: AROM, Both, 15 reps, Seated Hip Flexion/Marching: AROM, Both, 10 reps, Seated   Assessment/Plan    PT Assessment Patient needs continued PT services  PT Problem List Decreased strength;Decreased mobility;Decreased activity tolerance;Decreased balance;Decreased knowledge of use of DME;Decreased skin integrity       PT Treatment Interventions Gait training;Functional mobility training;Therapeutic activities;Patient/family education;DME instruction;Therapeutic exercise;Stair training;Balance training    PT Goals (Current goals can be found in the Care Plan section)  Acute Rehab PT Goals Patient Stated Goal: be able to return home PT Goal Formulation: With patient Time For Goal Achievement: 07/02/22 Potential to Achieve Goals: Fair    Frequency Min 3X/week     Co-evaluation               AM-PAC PT "6 Clicks" Mobility  Outcome Measure Help needed turning from your back to your side while in a flat bed without using bedrails?: A Lot Help needed moving from lying on your back to sitting on the side of a flat bed without using bedrails?: A Lot Help needed moving to and from a bed to a chair (including a wheelchair)?: A Little Help needed standing up from a chair using your arms (e.g., wheelchair or bedside chair)?: A Little Help needed to walk in hospital room?: A Lot Help needed climbing 3-5 steps with a railing? : A Lot 6 Click Score: 14    End of Session Equipment Utilized During Treatment: Gait belt Activity Tolerance: Patient tolerated treatment well Patient left: in chair;with call bell/phone within reach;with chair alarm set Nurse Communication: Mobility status PT Visit Diagnosis: Other abnormalities of gait and mobility  (R26.89);Difficulty in walking, not elsewhere classified (R26.2)    Time: 3903-0092 PT Time Calculation (min) (ACUTE ONLY): 23 min   Charges:   PT Evaluation $PT Eval Moderate Complexity: 1 Mod PT Treatments $Therapeutic Activity: 8-22 mins        Bayard Males, PT Acute Rehabilitation Services Office: [phone]   [name] 06/18/2022, 11:47 AM

## 2022-06-19 DIAGNOSIS — L0390 Cellulitis, unspecified: Secondary | ICD-10-CM | POA: Diagnosis not present

## 2022-06-19 DIAGNOSIS — A419 Sepsis, unspecified organism: Secondary | ICD-10-CM | POA: Diagnosis not present

## 2022-06-19 DIAGNOSIS — L8990 Pressure ulcer of unspecified site, unspecified stage: Secondary | ICD-10-CM | POA: Insufficient documentation

## 2022-06-19 LAB — BASIC METABOLIC PANEL
Anion gap: 6 (ref 5–15)
BUN: 22 mg/dL (ref 8–23)
CO2: 28 mmol/L (ref 22–32)
Calcium: 8.1 mg/dL — ABNORMAL LOW (ref 8.9–10.3)
Chloride: 107 mmol/L (ref 98–111)
Creatinine, Ser: 1.11 mg/dL — ABNORMAL HIGH (ref 0.44–1.00)
GFR, Estimated: 54 mL/min — ABNORMAL LOW (ref >60)
Glucose, Bld: 92 mg/dL (ref 70–99)
Potassium: 3.7 mmol/L (ref 3.5–5.1)
Sodium: 141 mmol/L (ref 135–145)

## 2022-06-19 LAB — CULTURE, BLOOD (ROUTINE X 2): Special Requests: ADEQUATE

## 2022-06-19 LAB — CBC
HCT: 32.7 % — ABNORMAL LOW (ref 36.0–46.0)
Hemoglobin: 10.7 g/dL — ABNORMAL LOW (ref 12.0–15.0)
MCH: 26.5 pg (ref 26.0–34.0)
MCHC: 32.7 g/dL (ref 30.0–36.0)
MCV: 80.9 fL (ref 80.0–100.0)
Platelets: 199 10*3/uL (ref 150–400)
RBC: 4.04 MIL/uL (ref 3.87–5.11)
RDW: 15.9 % — ABNORMAL HIGH (ref 11.5–15.5)
WBC: 9.3 10*3/uL (ref 4.0–10.5)
nRBC: 0 % (ref 0.0–0.2)

## 2022-06-19 LAB — GLUCOSE, CAPILLARY
Glucose-Capillary: 60 mg/dL — ABNORMAL LOW (ref 70–99)
Glucose-Capillary: 65 mg/dL — ABNORMAL LOW (ref 70–99)
Glucose-Capillary: 73 mg/dL (ref 70–99)
Glucose-Capillary: 113 mg/dL — ABNORMAL HIGH (ref 70–99)
Glucose-Capillary: 81 mg/dL (ref 70–99)
Glucose-Capillary: 109 mg/dL — ABNORMAL HIGH (ref 70–99)
Glucose-Capillary: 66 mg/dL — ABNORMAL LOW (ref 70–99)

## 2022-06-19 MED ORDER — GLUCOSE 40 % PO GEL
ORAL | Status: AC
  Filled 2022-06-19: qty 1

## 2022-06-19 MED ORDER — INSULIN GLARGINE-YFGN 100 UNIT/ML ~~LOC~~ SOLN
11.0000 [IU] | Freq: Every day | SUBCUTANEOUS | Status: DC
  Filled 2022-06-19 (×2): qty 0.11

## 2022-06-19 MED ORDER — METOPROLOL SUCCINATE ER 50 MG PO TB24
50.0000 mg | ORAL_TABLET | Freq: Every day | ORAL | Status: DC
  Administered 2022-06-19 – 2022-06-24 (×6): 50 mg via ORAL
  Filled 2022-06-19 (×6): qty 1

## 2022-06-19 MED ORDER — GLUCOSE 40 % PO GEL
1.0000 | Freq: Once | ORAL | Status: AC
  Administered 2022-06-19: 31 g via ORAL

## 2022-06-19 MED ORDER — CEFAZOLIN SODIUM-DEXTROSE 1-4 GM/50ML-% IV SOLN
1.0000 g | Freq: Three times a day (TID) | INTRAVENOUS | Status: DC
  Administered 2022-06-19 – 2022-06-20 (×3): 1 g via INTRAVENOUS
  Filled 2022-06-19 (×4): qty 50

## 2022-06-19 MED ORDER — DEXTROSE 50 % IV SOLN
25.0000 mL | Freq: Once | INTRAVENOUS | Status: AC
  Administered 2022-06-19: 25 mL via INTRAVENOUS
  Filled 2022-06-19: qty 50

## 2022-06-19 NOTE — Plan of Care (Signed)

## 2022-06-19 NOTE — Progress Notes (Signed)
CBG at 0625 was 60,  hypoglycemic protocol activated.  Pt given 4 oz apple Juice, rechecked in 15 minutes, and CBG 65.  Pt given 4 oz apple juice. Rechecked and 56.  Pt given glucose oral gel, did not want drink.  Rechecked cbg and 73.

## 2022-06-19 NOTE — Progress Notes (Signed)
Subjective:   Patient states that her pain in her right ankle has improved today. Patient states that she has been eating and drinking well and had a large dinner last night. Patient states that her breathing feels good today on RA. She denies fever overnight but did have some chills.  Denies chest pain. Discussed plan to continue with antibiotics today.  Objective:  Vital signs in last 24 hours: Vitals:   06/18/22 2000 06/19/22 0015 06/19/22 0449 06/19/22 0450  BP: 111/65 104/64  104/80  Pulse: (!) 102 78  85  Resp: 20 (!) 22    Temp: 97.6 F (36.4 C) (!) 97.4 F (36.3 C)  (!) 97.4 F (36.3 C)  TempSrc:  Oral  Oral  SpO2: 96% 98%  98%  Weight:   89.8 kg   Height:       Physical Exam: General: laying in bed, not in acute distress  HENT: 2X 1 cm circular abrasions of the left eyebrow without bleeding Pulmonary: normal respiratory effort, minimal wheezing in all 4 lung fields, no rales or rhochi  Cardiac: regular rate and rhythm, 1+ pitting edema of bilateral LE Skin: diffuse dry cracked skin, chronic venous stasis changes of bilateral LE, erythema of the right ankle and right anterior shin MSK: Pain with ROM of the right ankle in all directions Neurologic: Aox3, responding appropriately, clear speech   Assessment/Plan:  Principal Problem:   Sepsis due to cellulitis ([place]) Active Problems:   ICD (implantable cardioverter-defibrillator) in place   Uncontrolled type 2 diabetes mellitus with hyperglycemia (HCC)   Restrictive lung disease   Chronic combined systolic and diastolic congestive heart failure (HCC)   Sepsis (HCC)  [name] is a 71 y/o female with past medical history of HFrEF s/p ICD, restrictive lung disease, HTN, HLD, and T2DM who was admitted for sepsis due to strep bacteremia.   Sepsis secondary to [place] bacteremia Sepsis likely secondary to bacteremia from RLE cellulitis vs septic arthritis vs fasciitis. Previous ORIF from R ankle fracture in 2020. Blood  cultures with gram positive rods in chains on 9/15. Blood cultures positive for strep G on 9/16. MRSA swab negative. Vancomycin discontinued on 9/16. Still with pain in right ankle with movement. Mentation and blood pressure have improved following IV fluids. Remains afebrile today. Leukocytosis has resolved. Finished 3 day course of Cefepime on 9/17. Will de-escalate to Cefazolin today following results of sensitivities.  - Cefazolin 1g q8 (day 1) - Tylenol for fever - Trend blood cultures - Trend fever curve - Trend CBC   Acute kidney injury Baseline creatinine 0.8-1. Cr improved to 1.11 today following IV fluids.  - Trend BMP - Holding entresto and spironolactone    Non-ischemic cardiomyopathy post implantable cardiac defibrillator HTN Echo in 06/2021 demonstrated EF 20-25% with severely dilated LA. ICD placed in 02/2011. Home medications furosemide '40mg'$  twice daily metoprolol succinate '50mg'$ , sacubitril-valsartan 49-'51mg'$  twice daily, farxiga, and spironolactone 12.'5mg'$  daily. Still with mild bilateral LE edema on exam. No lasix for today. BP remains low-normal today.  - Restarting metoprolol today - Holding entresto and spironolactone - Strict I/Os w/ daily weights   Fall Mentation much improved. Not having significant pain outside of right ankle. Patient would not like SNF but comfortable with [facility] services. PT recommends HH PT. OT recommendeds tub/shower seat. - Will order [facility] PT    Restrictive lung disease Restrictive findings on PFTs in 10/2018. On 2L [place] at home at night. Coarse breath sounds on admission. Interstitial prominence in the RUL  potentially due to asymmetric edema or atypical infection on CXR. Breathing has improved today. Satting well on RA this morning.  - Albuterol 108ug q6 PRN - Umeclidinium-vilanterol 62.5-'25mg'$  q24   Type II diabetes A1C 10.7 in 01/2022. Takes tresiba 22u daily, lispro 6u 3X daily, farxiga, and victoza. Is eating better after having her dentures brought  to her. Glucose in the 60s overnight.  - Will decrease semglee to 11 units daily due to overnight lows - Aspart 6 units with meals  - SSI   Diet:  DYS 3 VTE: Enoxaparin IVF: LR  Code: Full   Prior to Admission Living Arrangement: home living alone Anticipated Discharge Location: TBD Barriers to Discharge: continued management Dispo: Anticipated discharge in approximately more than 2 day(s).   Peggy Skeans, MD 06/19/2022, 5:11 AM Pager: [phone] After 5pm on weekdays and 1pm on weekends: On Call pager [phone]

## 2022-06-19 NOTE — Progress Notes (Addendum)
Pt CBG is 66, activated Hypoglycemic protocol.  she is refusing to take PO juice or gel at this time.  Will give D50 IV, 25 ml.  Rechecked cbg, 109.  Paged IM team advised of above, asked If OK to hold long acting insulin for tonight, advised it was ok. Updated Pt. Of actions.

## 2022-06-20 DIAGNOSIS — L03115 Cellulitis of right lower limb: Secondary | ICD-10-CM | POA: Diagnosis not present

## 2022-06-20 DIAGNOSIS — A419 Sepsis, unspecified organism: Secondary | ICD-10-CM | POA: Diagnosis not present

## 2022-06-20 DIAGNOSIS — L0390 Cellulitis, unspecified: Secondary | ICD-10-CM | POA: Diagnosis not present

## 2022-06-20 LAB — URINE CULTURE: Culture: 2000 — AB

## 2022-06-20 LAB — CBC
HCT: 38 % (ref 36.0–46.0)
Hemoglobin: 12.4 g/dL (ref 12.0–15.0)
MCH: 26.4 pg (ref 26.0–34.0)
MCHC: 32.6 g/dL (ref 30.0–36.0)
MCV: 80.9 fL (ref 80.0–100.0)
Platelets: 241 10*3/uL (ref 150–400)
RBC: 4.7 MIL/uL (ref 3.87–5.11)
RDW: 16.1 % — ABNORMAL HIGH (ref 11.5–15.5)
WBC: 7.9 10*3/uL (ref 4.0–10.5)
nRBC: 0.3 % — ABNORMAL HIGH (ref 0.0–0.2)

## 2022-06-20 LAB — GLUCOSE, CAPILLARY
Glucose-Capillary: 61 mg/dL — ABNORMAL LOW (ref 70–99)
Glucose-Capillary: 122 mg/dL — ABNORMAL HIGH (ref 70–99)
Glucose-Capillary: 49 mg/dL — ABNORMAL LOW (ref 70–99)
Glucose-Capillary: 38 mg/dL — CL (ref 70–99)
Glucose-Capillary: 56 mg/dL — ABNORMAL LOW (ref 70–99)
Glucose-Capillary: 97 mg/dL (ref 70–99)
Glucose-Capillary: 115 mg/dL — ABNORMAL HIGH (ref 70–99)
Glucose-Capillary: 70 mg/dL (ref 70–99)
Glucose-Capillary: 151 mg/dL — ABNORMAL HIGH (ref 70–99)

## 2022-06-20 LAB — BASIC METABOLIC PANEL
Anion gap: 8 (ref 5–15)
BUN: 18 mg/dL (ref 8–23)
CO2: 26 mmol/L (ref 22–32)
Calcium: 8.4 mg/dL — ABNORMAL LOW (ref 8.9–10.3)
Chloride: 107 mmol/L (ref 98–111)
Creatinine, Ser: 1.12 mg/dL — ABNORMAL HIGH (ref 0.44–1.00)
GFR, Estimated: 54 mL/min — ABNORMAL LOW (ref >60)
Glucose, Bld: 134 mg/dL — ABNORMAL HIGH (ref 70–99)
Potassium: 3.7 mmol/L (ref 3.5–5.1)
Sodium: 141 mmol/L (ref 135–145)

## 2022-06-20 MED ORDER — CEFAZOLIN SODIUM-DEXTROSE 1-4 GM/50ML-% IV SOLN
1.0000 g | Freq: Three times a day (TID) | INTRAVENOUS | Status: DC
  Filled 2022-06-20 (×2): qty 50

## 2022-06-20 MED ORDER — INSULIN ASPART 100 UNIT/ML IJ SOLN: 0.0000 [IU] | Freq: Three times a day (TID) | INTRAMUSCULAR | Status: DC

## 2022-06-20 MED ORDER — SACUBITRIL-VALSARTAN 49-51 MG PO TABS
1.0000 | ORAL_TABLET | Freq: Two times a day (BID) | ORAL | Status: DC
  Administered 2022-06-20 – 2022-06-21 (×3): 1 via ORAL
  Filled 2022-06-20 (×3): qty 1

## 2022-06-20 MED ORDER — DEXTROSE 50 % IV SOLN
INTRAVENOUS | Status: AC
  Administered 2022-06-20: 50 mL
  Filled 2022-06-20: qty 50

## 2022-06-20 MED ORDER — CEFDINIR 300 MG PO CAPS
300.0000 mg | ORAL_CAPSULE | Freq: Two times a day (BID) | ORAL | Status: DC
  Administered 2022-06-20: 300 mg via ORAL
  Filled 2022-06-20: qty 1

## 2022-06-20 MED ORDER — CEFAZOLIN SODIUM-DEXTROSE 2-4 GM/100ML-% IV SOLN
2.0000 g | Freq: Three times a day (TID) | INTRAVENOUS | Status: DC
  Administered 2022-06-20 – 2022-06-23 (×9): 2 g via INTRAVENOUS
  Filled 2022-06-20 (×9): qty 100

## 2022-06-20 MED ORDER — CEFAZOLIN SODIUM-DEXTROSE 2-4 GM/100ML-% IV SOLN: 2.0000 g | Freq: Three times a day (TID) | INTRAVENOUS | Status: DC

## 2022-06-20 MED ORDER — INSULIN ASPART 100 UNIT/ML IJ SOLN: 0.0000 [IU] | Freq: Every day | INTRAMUSCULAR | Status: DC

## 2022-06-20 NOTE — Plan of Care (Signed)

## 2022-06-20 NOTE — Progress Notes (Signed)
Mobility Specialist Progress Note    06/20/22 1154  Mobility  Activity Transferred from bed to chair  Level of Assistance Minimal assist, patient does 75% or more  Assistive Device Front wheel walker  Distance Ambulated (ft) 2 ft  Activity Response Tolerated well  $Mobility charge 1 Mobility   During Mobility: 129/78 BP, >/=90% SpO2 on RA Post-Mobility: 70 HR, 98% SpO2  Pt received and agreeable. C/o SOB, SpO2 in mid to high 90s on RA throughout session. Left with call bell in reach. RN notified.   Hildred Alamin Mobility Specialist

## 2022-06-20 NOTE — Progress Notes (Addendum)
Subjective:   Patient is feeling better today.  She states that she was able to walk with PT yesterday.  States that her pain in her right ankle has improved today. Discussed that we are transitioning her to oral antibiotics today.  Discussed that we might be able to discharge her soon.  Objective:  Vital signs in last 24 hours: Vitals:   06/19/22 1559 06/19/22 1956 06/19/22 2336 06/20/22 0325  BP: (!) 98/59 123/81 (!) 145/71 105/73  Pulse: 78 82 79 74  Resp: '17 20 19 19  '$ Temp: 97.6 F (36.4 C) 97.6 F (36.4 C) 97.7 F (36.5 C) (!) 97.5 F (36.4 C)  TempSrc: Oral Oral Oral Oral  SpO2: 94% 100% 94% 98%  Weight:    93.8 kg  Height:       Physical Exam: General: laying in bed, not in acute distress  HENT: 2X healed 1 cm circular abrasions of the left eyebrow without active bleeding Pulmonary: normal respiratory effort, wheezes in all 4 lung fields, no rales or rhochi  Cardiac: regular rate and rhythm, 1+  edema of bilateral LE Skin: diffuse dry cracked skin, chronic venous stasis changes of bilateral LE, no erythema of the right ankle or right shin, no warmth of the right ankle or right shin, mild tenderness to palpation of the right ankle  MSK: mild pain with ROM of the right ankle in all directions, good ROM of the right ankle Neurologic: Aox3, responding appropriately, clear speech   Assessment/Plan:  Principal Problem:   Sepsis due to cellulitis ([place]) Active Problems:   ICD (implantable cardioverter-defibrillator) in place   Uncontrolled type 2 diabetes mellitus with hyperglycemia (HCC)   Restrictive lung disease   Chronic combined systolic and diastolic congestive heart failure (HCC)   Sepsis (HCC)   Pressure injury of skin  [name] is a 71 y/o female with past medical history of HFrEF s/p ICD, restrictive lung disease, HTN, HLD, and T2DM who was admitted for sepsis due to strep bacteremia.   # Sepsis secondary to [place] bacteremia # RLE Cellulitis History of ORIF  from R ankle fracture in 2020. Suspect sepsis secondary to bacteremia from RLE cellulitis. BC positive for strep G on 9/16. Vancomycin discontinued on 9/16. Completed 3-day course of Cefepime on 9/17. Switched to Cefazolin on 9/17 for two days following results of sensitivities. Right ankle pain improved today. No erythema noted on exam. BP remains improved following IV fluids. Remains afebrile. No leukocytosis today. ID recommends TEE to r/o seeding of her ICD and repeat blood cultures which were both ordered.  - Cefazolin 2g q8 (day 2) - Tylenol for fever - Trend fever curve - Trend CBC   2. # Acute kidney injury Baseline creatinine 0.8-1. Given IVF. Creatinine unchanged from yesterday.    - Trend BMP - Holding spironolactone    3. # Non-ischemic cardiomyopathy post implantable cardiac defibrillator # HTN EF 20-25% with severely dilated LA on Echo in 06/2021. S/p ICD in 02/2011. Takes furosemide '40mg'$  BID, metoprolol succinate '50mg'$ , sacubitril-valsartan 49-'51mg'$  BID, farxiga, and spironolactone 12.'5mg'$  at home. LE edema mildly improved today. No lasix for today. BP continues to be low-normal  - Continue metoprolol  - Start entresto - Holding spironolactone - Strict I/Os w/ daily weights   4. # Fall Mentation appears at baseline. Not complaining of pain outside of right ankle. Patient has denied offer of SNF and would instead like home health services. PT recommends HH PT. OT recommendeds tub/shower seat. - Will order [facility]  PT     5. # Restrictive lung disease PFTs in 10/2018. On 2L [place] at home only at night. CXR on admission demonstrated interstitial prominence in the RUL potentially secondary to asymmetric edema vs atypical infection. Wheezing still present on exam but reports improved breathing. Satting well on RA.  - Albuterol 108ug q6 PRN - Umeclidinium-vilanterol 62.5-'25mg'$  q24   6. # Type II diabetes A1C 10.7 in 01/2022. On tresiba 22u daily, lispro 6u 3X daily, farxiga, and victoza.  Semglee last night held due to blood glucose in the 60s. Will discontinue semglee for now given lows.   - SSI (Aspart 0-15 units 3X daily w/ meals) - Monitor CBG  7. # Dispo I spoke with daughter today states that the patient lives at home by herself.  She states that a friend stops by and checks on the patient several days per week but not every day.  She states that she is unable to stay with the patient daily as she works 8-5 daily.   Diet:  DYS 3 VTE: Enoxaparin IVF: LR  Code: Full   Prior to Admission Living Arrangement: home living alone Anticipated Discharge Location: TBD Barriers to Discharge: continued management Dispo: Anticipated discharge in approximately less than 2 day(s).    [name], MD 06/20/2022, 4:19 AM Pager: [phone] After 5pm on weekdays and 1pm on weekends: On Call pager [phone]

## 2022-06-20 NOTE — Progress Notes (Signed)
Pt CBG was 61, she had 2 orange juice around 0545 this AM, Hypoglycemic protocol activated.  Given 8 oz of juice rechecked and 49, checked again and dropped to 38.  Gave Pt 25 gram IV dextrose D 50.  CBG rechecked and 122.

## 2022-06-20 NOTE — Progress Notes (Signed)
Mobility Specialist Progress Note    06/20/22 1409  Mobility  Activity Ambulated with assistance in room  Level of Assistance Minimal assist, patient does 75% or more  Assistive Device Front wheel walker  Distance Ambulated (ft) 80 ft (20+20+20+20)  Activity Response Tolerated well  $Mobility charge 1 Mobility   Pre-Mobility: 102 HR During Mobility: 86 HR Post-Mobility: 94 HR  Pt received in chair and agreeable. No complaints. Took x3 seated rest breaks. Returned to chair with call bell in reach.    Hildred Alamin Mobility Specialist

## 2022-06-20 NOTE — Consult Note (Signed)
[facility] for Infectious Disease  Total days of antibiotics 5       Reason for Consult: streptococcal bacteremia 2/2 cellulitis  Referring Physician: vincent  Principal Problem:   Sepsis due to cellulitis [facility]) Active Problems:   ICD (implantable cardioverter-defibrillator) in place   Uncontrolled type 2 diabetes mellitus with hyperglycemia ([place])   Restrictive lung disease   Chronic combined systolic and diastolic congestive heart failure (HCC)   Sepsis ([place])   Pressure injury of skin    HPI: [name] is a 71 y.o. female with hx of T2DM, chronic dHF, hx of ICD, restrictive lung disease on supplemental oxygen was admitted on 9/15 for increasing malaise and weakness, with previous ED visit 72hr prior with syncopal episode. She had subjective chills/fever for several days prior to admit in addition to pain and swelling to bilateral lower extremity R>L. IN the ED, her exam was concerning for right lower extremity cellulitis. She had mildly elevated LA with leukocytosis and aki concerning for sepsis. She was started on broad spectrum abtx (vanco & cefepime) and her infectious work up revealed that she had 1 of 2 sets of blood cx with strep group G and colonized with MSSA on her urine culture. She has been on abtx for 5 days with some improvement in erythema but still tender to lower extremities. No recent cuts/injury/bites to lower extremities. Her leukocytosis improved from 13K to 7.9K.   Past Medical History:  Diagnosis Date   ATN (acute tubular necrosis) ([place]) 07/15/2014   Automatic implantable cardioverter-defibrillator in situ    Automatic implantable cardioverter-defibrillator in situ 05/04/2007   Qualifier: Diagnosis of  By: Isla Pence     Cardiac defibrillator in situ    Atlas II VR ([facility]) implanted by Dr Lovena Le   CHF (congestive heart failure) ([place])    Chronic combined systolic and diastolic heart failure ([place])    a. EF 35-40% in past;  b. Echo 7/13:  EF 45-50%, Gr 2  diast dysfn, mild AI, mild MAC, trivial MR, mild LAE, PASP 47.   Chronic ulcer of leg ([place])    04-09-15 resolved-not a problem.   Colon polyps 04/12/2013   Rectosigmoid polyp   COPD (chronic obstructive pulmonary disease) (HCC)    O2 at night   Depression    Diabetes mellitus    Diabetes mellitus, type 2 ([place]) 04/03/2012   HIGH RISK FEET.. Please have patient take shoes and socks off every visit for visual foot inspection.     Eczema    Elevated alkaline phosphatase level    GGT and 5'nucleotidase 8/13 normal   Health care maintenance 01/22/2013   Surgically absent cervix- no pap needed (Path report 07/2000: uterine body with attached bilateral  adnexa and separate cervix.)   History of oxygen administration    oxygen @ 2 l/m nasally bedtime 24/7   HTN (hypertension)    Hx of cardiac cath    a. [place] 2003 normal;  b. [facility] 6/13:  Mild calcification in the LM, o/w normal coronary arteries, EF 45%.    Hyperkalemia 08/08/2017   Hyperlipidemia    Elevated triglyceride in 2019   Hyperlipidemia associated with type 2 diabetes mellitus ([place]) 05/25/2007   Qualifier: Diagnosis of  By: Hassell Done FNP, [place]     Hyperthyroidism, subclinical    HYPERTHYROIDISM, SUBCLINICAL 05/06/2009   Qualifier: Diagnosis of  By: Darrick Meigs     Implantable cardioverter-defibrillator (ICD) generator end of life    Lipoma    Need for  hepatitis C screening test 04/25/2019   NICM (nonischemic cardiomyopathy) ([place])    Obesity    On home oxygen therapy    "2L; 24/7" (10/11/2016)   Post menopausal syndrome    Shortness of breath 07/14/2017   Skin rash 07/12/2018   Sleep apnea    pt denies 04/12/2013    Allergies:  Allergies  Allergen Reactions   Actos [Pioglitazone] Other (See Comments)    REACTION: congestive heart failure    Naproxen Other (See Comments)    516m dose made her sleep for two days   Rosiglitazone Hives   Tomato Itching   Hydrocodone-Acetaminophen Itching   Hydrocortisone Hives, Itching and  Other (See Comments)    unknown      MEDICATIONS:  aspirin EC  81 mg Oral Daily   atorvastatin  80 mg Oral QHS   cefdinir  300 mg Oral Q12H   enoxaparin (LOVENOX) injection  40 mg Subcutaneous Q24H   insulin aspart  0-15 Units Subcutaneous TID WC   insulin glargine-yfgn  11 Units Subcutaneous QHS   metoprolol succinate  50 mg Oral Daily   sacubitril-valsartan  1 tablet Oral BID   umeclidinium-vilanterol  1 puff Inhalation Daily    Social History   Tobacco Use   Smoking status: Every Day    Packs/day: 0.50    Years: 45.00    Total pack years: 22.50    Types: Cigarettes   Smokeless tobacco: Never   Tobacco comments:    currently smoking 2 cigs per day  Vaping Use   Vaping Use: Never used  Substance Use Topics   Alcohol use: Not Currently    Alcohol/week: 3.0 standard drinks of alcohol    Types: 3 Shots of liquor per week   Drug use: No    Family History  Problem Relation Age of Onset   Stroke Mother    Seizures Father    Heart disease Father    Diabetes Sister    Asthma Maternal Aunt        aunts   Asthma Maternal Uncle        uncles   Heart disease Paternal Aunt        aunts   Heart disease Paternal Uncle        uncles   Heart disease Maternal Aunt        aunts   Heart disease Maternal Uncle        uncles   Heart disease Maternal Grandfather    Breast cancer Daughter    Breast cancer Cousin    Asthma Grandchild    Colon cancer Neg Hx    Colon polyps Neg Hx    Esophageal cancer Neg Hx    Kidney disease Neg Hx    Gallbladder disease Neg Hx      Review of Systems  Constitutional: + for fever, chills, diaphoresis, activity change, appetite change, fatigue and unexpected weight change.  HENT: Negative for congestion, sore throat, rhinorrhea, sneezing, trouble swallowing and sinus pressure.  Eyes: Negative for photophobia and visual disturbance.  Respiratory: Negative for cough, chest tightness, shortness of breath, wheezing and stridor.   Cardiovascular: Negative for chest pain, palpitations and leg swelling.  Gastrointestinal: Negative for nausea, vomiting, abdominal pain, diarrhea, constipation, blood in stool, abdominal distention and anal bleeding.  Genitourinary: Negative for dysuria, hematuria, flank pain and difficulty urinating.  Musculoskeletal: Negative for myalgias, back pain, joint swelling, arthralgias and gait problem.  Skin: + for color change, pallor, rash and wound.  Neurological:  Negative for dizziness, tremors, weakness and light-headedness.  Hematological: Negative for adenopathy. Does not bruise/bleed easily.  Psychiatric/Behavioral: Negative for behavioral problems, confusion, sleep disturbance, dysphoric mood, decreased concentration and agitation.   OBJECTIVE: Temp:  [97.4 F (36.3 C)-97.7 F (36.5 C)] 97.4 F (36.3 C) (09/18 1005) Pulse Rate:  [74-93] 77 (09/18 1005) Resp:  [17-20] 17 (09/18 1005) BP: (98-145)/(59-81) 118/79 (09/18 1005) SpO2:  [94 %-100 %] 97 % (09/18 1005) Weight:  [93.8 kg] 93.8 kg (09/18 0325) Physical Exam  Constitutional:  oriented to person, place, and time. appears well-developed and well-nourished. No distress.  HENT: [place]/AT, PERRLA, no scleral icterus Mouth/Throat: Oropharynx is clear and moist. No oropharyngeal exudate.  Cardiovascular: Normal rate, regular rhythm and normal heart sounds. Exam reveals no gallop and no friction rub.  No murmur heard.  Chest wall : no tenderness to icd pocket Pulmonary/Chest: Effort normal and breath sounds normal. No respiratory distress.  has no wheezes.  Neck = supple, no nuchal rigidity Abdominal: Soft. Bowel sounds are normal.  exhibits no distension. There is no tenderness.  Lymphadenopathy: no cervical adenopathy. No axillary adenopathy Neurological: alert and oriented to person, place, and time.  Skin: Skin is warm and dry. Dry flaky skin with some excoriation to upper chest wall. Hyperpigmentation to lower extremities. Some  increase warmth to right lower leg Psychiatric: a normal mood and affect.  behavior is normal.    LABS: Results for orders placed or performed during the hospital encounter of 06/17/22 (from the past 48 hour(s))  Glucose, capillary     Status: Abnormal   Collection Time: 06/18/22  3:47 PM  Result Value Ref Range   Glucose-Capillary 166 (H) 70 - 99 mg/dL    Comment: Glucose reference range applies only to samples taken after fasting for at least 8 hours.  Glucose, capillary     Status: Abnormal   Collection Time: 06/18/22  9:29 PM  Result Value Ref Range   Glucose-Capillary 47 (L) 70 - 99 mg/dL    Comment: Glucose reference range applies only to samples taken after fasting for at least 8 hours.  Glucose, capillary     Status: Abnormal   Collection Time: 06/18/22  9:31 PM  Result Value Ref Range   Glucose-Capillary 52 (L) 70 - 99 mg/dL    Comment: Glucose reference range applies only to samples taken after fasting for at least 8 hours.  Glucose, capillary     Status: Abnormal   Collection Time: 06/18/22  9:48 PM  Result Value Ref Range   Glucose-Capillary 47 (L) 70 - 99 mg/dL    Comment: Glucose reference range applies only to samples taken after fasting for at least 8 hours.  Glucose, capillary     Status: Abnormal   Collection Time: 06/18/22 10:03 PM  Result Value Ref Range   Glucose-Capillary 52 (L) 70 - 99 mg/dL    Comment: Glucose reference range applies only to samples taken after fasting for at least 8 hours.  Glucose, capillary     Status: None   Collection Time: 06/18/22 10:17 PM  Result Value Ref Range   Glucose-Capillary 75 70 - 99 mg/dL    Comment: Glucose reference range applies only to samples taken after fasting for at least 8 hours.  Glucose, capillary     Status: None   Collection Time: 06/19/22 12:09 AM  Result Value Ref Range   Glucose-Capillary 97 70 - 99 mg/dL    Comment: Glucose reference range applies only to samples taken after fasting  for at least 8  hours.  CBC     Status: Abnormal   Collection Time: 06/19/22  4:10 AM  Result Value Ref Range   WBC 9.3 4.0 - 10.5 K/uL   RBC 4.04 3.87 - 5.11 MIL/uL   Hemoglobin 10.7 (L) 12.0 - 15.0 g/dL   HCT 32.7 (L) 36.0 - 46.0 %   MCV 80.9 80.0 - 100.0 fL   MCH 26.5 26.0 - 34.0 pg   MCHC 32.7 30.0 - 36.0 g/dL   RDW 15.9 (H) 11.5 - 15.5 %   Platelets 199 150 - 400 K/uL   nRBC 0.0 0.0 - 0.2 %    Comment: Performed at [facility], [place] [address] [place], [place] [address]  Basic metabolic panel     Status: Abnormal   Collection Time: 06/19/22  4:10 AM  Result Value Ref Range   Sodium 141 135 - 145 mmol/L   Potassium 3.7 3.5 - 5.1 mmol/L   Chloride 107 98 - 111 mmol/L   CO2 28 22 - 32 mmol/L   Glucose, Bld 92 70 - 99 mg/dL    Comment: Glucose reference range applies only to samples taken after fasting for at least 8 hours.   BUN 22 8 - 23 mg/dL   Creatinine, Ser 1.11 (H) 0.44 - 1.00 mg/dL   Calcium 8.1 (L) 8.9 - 10.3 mg/dL   GFR, Estimated 54 (L) >60 mL/min    Comment: (NOTE) Calculated using the CKD-EPI Creatinine Equation (2021)    Anion gap 6 5 - 15    Comment: Performed at [facility] [address], [place], [place] [address]  Glucose, capillary     Status: Abnormal   Collection Time: 06/19/22  6:25 AM  Result Value Ref Range   Glucose-Capillary 60 (L) 70 - 99 mg/dL    Comment: Glucose reference range applies only to samples taken after fasting for at least 8 hours.  Glucose, capillary     Status: Abnormal   Collection Time: 06/19/22  6:45 AM  Result Value Ref Range   Glucose-Capillary 65 (L) 70 - 99 mg/dL    Comment: Glucose reference range applies only to samples taken after fasting for at least 8 hours.  Glucose, capillary     Status: Abnormal   Collection Time: 06/19/22  7:01 AM  Result Value Ref Range   Glucose-Capillary 56 (L) 70 - 99 mg/dL    Comment: Glucose reference range applies only to samples taken after fasting for at least 8 hours.  Glucose,  capillary     Status: None   Collection Time: 06/19/22  7:27 AM  Result Value Ref Range   Glucose-Capillary 73 70 - 99 mg/dL    Comment: Glucose reference range applies only to samples taken after fasting for at least 8 hours.  Glucose, capillary     Status: Abnormal   Collection Time: 06/19/22 11:17 AM  Result Value Ref Range   Glucose-Capillary 113 (H) 70 - 99 mg/dL    Comment: Glucose reference range applies only to samples taken after fasting for at least 8 hours.  Glucose, capillary     Status: None   Collection Time: 06/19/22  3:58 PM  Result Value Ref Range   Glucose-Capillary 81 70 - 99 mg/dL    Comment: Glucose reference range applies only to samples taken after fasting for at least 8 hours.  Glucose, capillary     Status: Abnormal   Collection Time: 06/19/22  8:50 PM  Result  Value Ref Range   Glucose-Capillary 66 (L) 70 - 99 mg/dL    Comment: Glucose reference range applies only to samples taken after fasting for at least 8 hours.  Glucose, capillary     Status: Abnormal   Collection Time: 06/19/22  9:18 PM  Result Value Ref Range   Glucose-Capillary 109 (H) 70 - 99 mg/dL    Comment: Glucose reference range applies only to samples taken after fasting for at least 8 hours.  Glucose, capillary     Status: Abnormal   Collection Time: 06/20/22  6:10 AM  Result Value Ref Range   Glucose-Capillary 61 (L) 70 - 99 mg/dL    Comment: Glucose reference range applies only to samples taken after fasting for at least 8 hours.   Comment 1 Notify RN    Comment 2 Document in Chart   Glucose, capillary     Status: Abnormal   Collection Time: 06/20/22  6:31 AM  Result Value Ref Range   Glucose-Capillary 49 (L) 70 - 99 mg/dL    Comment: Glucose reference range applies only to samples taken after fasting for at least 8 hours.  Glucose, capillary     Status: Abnormal   Collection Time: 06/20/22  6:34 AM  Result Value Ref Range   Glucose-Capillary 38 (LL) 70 - 99 mg/dL    Comment: Glucose  reference range applies only to samples taken after fasting for at least 8 hours.   Comment 1 NURSE AWARE   Glucose, capillary     Status: Abnormal   Collection Time: 06/20/22  6:52 AM  Result Value Ref Range   Glucose-Capillary 122 (H) 70 - 99 mg/dL    Comment: Glucose reference range applies only to samples taken after fasting for at least 8 hours.   Comment 1 Notify RN    Comment 2 Document in Chart   CBC     Status: Abnormal   Collection Time: 06/20/22  7:30 AM  Result Value Ref Range   WBC 7.9 4.0 - 10.5 K/uL   RBC 4.70 3.87 - 5.11 MIL/uL   Hemoglobin 12.4 12.0 - 15.0 g/dL   HCT 38.0 36.0 - 46.0 %   MCV 80.9 80.0 - 100.0 fL   MCH 26.4 26.0 - 34.0 pg   MCHC 32.6 30.0 - 36.0 g/dL   RDW 16.1 (H) 11.5 - 15.5 %   Platelets 241 150 - 400 K/uL   nRBC 0.3 (H) 0.0 - 0.2 %    Comment: Performed at [facility], [place] [address] [place],  [address]  Basic metabolic panel     Status: Abnormal   Collection Time: 06/20/22  7:30 AM  Result Value Ref Range   Sodium 141 135 - 145 mmol/L   Potassium 3.7 3.5 - 5.1 mmol/L   Chloride 107 98 - 111 mmol/L   CO2 26 22 - 32 mmol/L   Glucose, Bld 134 (H) 70 - 99 mg/dL    Comment: Glucose reference range applies only to samples taken after fasting for at least 8 hours.   BUN 18 8 - 23 mg/dL   Creatinine, Ser 1.12 (H) 0.44 - 1.00 mg/dL   Calcium 8.4 (L) 8.9 - 10.3 mg/dL   GFR, Estimated 54 (L) >60 mL/min    Comment: (NOTE) Calculated using the CKD-EPI Creatinine Equation (2021)    Anion gap 8 5 - 15    Comment: Performed at [facility] [address] [place], [place] [address]  Glucose, capillary  Status: Abnormal   Collection Time: 06/20/22 11:29 AM  Result Value Ref Range   Glucose-Capillary 115 (H) 70 - 99 mg/dL    Comment: Glucose reference range applies only to samples taken after fasting for at least 8 hours.    MICRO: 9/15 blood cx 1 of 2 sets + Streptococcus group g      MIC    CEFTRIAXONE <=0.12 SENS...  Sensitive    ERYTHROMYCIN <=0.12 SENS... Sensitive    LEVOFLOXACIN 0.5 SENSITIVE  Sensitive    PENICILLIN <=0.06 SENS... Sensitive    VANCOMYCIN 0.5 SENSITIVE  Sensitive      Assessment/Plan:  71yo F with hx of ICD, T2DM, admitted for sepsis due to streptococcal bacteremia/cellulitis. Concern that we will still need to rule out for endocarditis given her cardiac device and onset of symptoms may have been longer than originally described. - will repeat blood cx to ensure bacteremia clearance - will recommend she gets TEE to rule out endocarditis - will d/c oral cephalosporin and resume back to cefazolin 2gm IV Q 8hr to treat bacteremia until see results of TEE to help decide on length of course of treatment  Leukocytosis = improving  Lower extremity (R) cellulitis = slowly improving  Staph aureus urinary colonization = no need to treat since she is asymptomatic Eczema = may benefit from moisturizing lotion to minimize risk of open wound

## 2022-06-20 NOTE — Inpatient Diabetes Management (Signed)
Inpatient Diabetes Program Recommendations  AACE/ADA: New Consensus Statement on Inpatient Glycemic Control   Target Ranges:  Prepandial:   less than 140 mg/dL      Peak postprandial:   less than 180 mg/dL (1-2 hours)      Critically ill patients:  140 - 180 mg/dL    Latest Reference Range & Units 06/20/22 06:10 06/20/22 06:31 06/20/22 06:34 06/20/22 06:52 06/20/22 11:29  Glucose-Capillary 70 - 99 mg/dL 61 (L) 49 (L) 38 (LL) 122 (H) 115 (H)    Latest Reference Range & Units 06/19/22 06:45 06/19/22 07:01 06/19/22 07:27 06/19/22 11:17 06/19/22 15:58 06/19/22 20:50 06/19/22 21:18  Glucose-Capillary 70 - 99 mg/dL 65 (L) 56 (L) 73 113 (H) 81 66 (L) 109 (H)   Review of Glycemic Control  DM history: DM2 Outpatient DM medications: Tresiba 22 units daily, Humalog 6 units TID with meals, Farxiga 10 mg daily Current orders for Inpatient glycemic control: Semglee 11 units daily, Novolog 0-15 units TID with meals  Inpatient Diabetes Program Recommendations:    Insulin: No Semglee given on 06/19/22; last received Semglee 22 units on 06/18/22. Patient still having hypoglycemia today despite not getting Semglee on 06/19/22. May want to discontinue Semglee for now, add Novolog 0-5 units QHS, and then if CBGs are consistently over 180 mg/dl with Novolog correction, could consider adding back low dose basal insulin.  Thanks, Barnie Alderman, RN, MSN, [facility] Diabetes Coordinator Inpatient Diabetes Program [phone] (Team Pager from 8am to [place])

## 2022-06-20 NOTE — Plan of Care (Signed)
  Problem: Fluid Volume: Goal: Ability to maintain a balanced intake and output will improve Outcome: Progressing   Problem: Health Behavior/Discharge Planning: Goal: Ability to identify and utilize available resources and services will improve Outcome: Progressing Goal: Ability to manage health-related needs will improve Outcome: Progressing   Problem: Nutritional: Goal: Maintenance of adequate nutrition will improve Outcome: Progressing   Problem: Tissue Perfusion: Goal: Adequacy of tissue perfusion will improve Outcome: Progressing   Problem: Health Behavior/Discharge Planning: Goal: Ability to manage health-related needs will improve Outcome: Progressing   Problem: Nutritional: Goal: Maintenance of adequate nutrition will improve Outcome: Progressing

## 2022-06-20 NOTE — Progress Notes (Signed)
PHARMACY NOTE:  ANTIMICROBIAL RENAL DOSAGE ADJUSTMENT  Current antimicrobial regimen includes a mismatch between antimicrobial dosage and estimated renal function.  As per policy approved by the Pharmacy & Therapeutics and Medical Executive Committees, the antimicrobial dosage will be adjusted accordingly.  Current antimicrobial dosage:  cefazolin 1 gm q 8   Indication: cellulitis/bacteremia  Renal Function:  Estimated Creatinine Clearance: 51.3 mL/min (A) (by C-G formula based on SCr of 1.12 mg/dL (H)). '[]'$      On intermittent HD, scheduled: '[]'$      On CRRT    Antimicrobial dosage has been changed to:  cefazolin 2 gm q 8   Additional comments: Increase dose to optimize bacteremia treatment   Thank you for allowing pharmacy to be a part of this patient's care.  Jimmy Footman, PharmD, BCPS, BCIDP Infectious Diseases Clinical Pharmacist Phone: [phone] 06/20/2022 11:05 AM

## 2022-06-21 DIAGNOSIS — A419 Sepsis, unspecified organism: Secondary | ICD-10-CM | POA: Diagnosis not present

## 2022-06-21 DIAGNOSIS — L0390 Cellulitis, unspecified: Secondary | ICD-10-CM | POA: Diagnosis not present

## 2022-06-21 LAB — GLUCOSE, CAPILLARY
Glucose-Capillary: 73 mg/dL (ref 70–99)
Glucose-Capillary: 58 mg/dL — ABNORMAL LOW (ref 70–99)
Glucose-Capillary: 63 mg/dL — ABNORMAL LOW (ref 70–99)
Glucose-Capillary: 63 mg/dL — ABNORMAL LOW (ref 70–99)
Glucose-Capillary: 63 mg/dL — ABNORMAL LOW (ref 70–99)
Glucose-Capillary: 92 mg/dL (ref 70–99)
Glucose-Capillary: 138 mg/dL — ABNORMAL HIGH (ref 70–99)
Glucose-Capillary: 175 mg/dL — ABNORMAL HIGH (ref 70–99)
Glucose-Capillary: 215 mg/dL — ABNORMAL HIGH (ref 70–99)

## 2022-06-21 LAB — BASIC METABOLIC PANEL
Anion gap: 5 (ref 5–15)
BUN: 17 mg/dL (ref 8–23)
CO2: 29 mmol/L (ref 22–32)
Calcium: 8.3 mg/dL — ABNORMAL LOW (ref 8.9–10.3)
Chloride: 107 mmol/L (ref 98–111)
Creatinine, Ser: 1.06 mg/dL — ABNORMAL HIGH (ref 0.44–1.00)
GFR, Estimated: 57 mL/min — ABNORMAL LOW (ref >60)
Glucose, Bld: 67 mg/dL — ABNORMAL LOW (ref 70–99)
Potassium: 4.1 mmol/L (ref 3.5–5.1)
Sodium: 141 mmol/L (ref 135–145)

## 2022-06-21 LAB — CBC
HCT: 37.3 % (ref 36.0–46.0)
Hemoglobin: 11.8 g/dL — ABNORMAL LOW (ref 12.0–15.0)
MCH: 26 pg (ref 26.0–34.0)
MCHC: 31.6 g/dL (ref 30.0–36.0)
MCV: 82.2 fL (ref 80.0–100.0)
Platelets: 293 10*3/uL (ref 150–400)
RBC: 4.54 MIL/uL (ref 3.87–5.11)
RDW: 16.3 % — ABNORMAL HIGH (ref 11.5–15.5)
WBC: 8.2 10*3/uL (ref 4.0–10.5)
nRBC: 0.2 % (ref 0.0–0.2)

## 2022-06-21 MED ORDER — COSYNTROPIN 0.25 MG IJ SOLR
0.2500 mg | Freq: Once | INTRAMUSCULAR | Status: AC
  Administered 2022-06-22: 0.25 mg via INTRAVENOUS
  Filled 2022-06-21: qty 0.25

## 2022-06-21 MED ORDER — DEXTROSE 50 % IV SOLN
INTRAVENOUS | Status: AC
  Administered 2022-06-21: 50 mL
  Filled 2022-06-21: qty 50

## 2022-06-21 NOTE — Progress Notes (Cosign Needed Addendum)
Subjective:   Patient states that the pain in her right ankle has improved today but is still sore.  She states that she has been up and walking with physical therapy. She states that she has been eating well. She states that she was feeling a little cold overnight but no fevers. Discussed plan to continue on antibiotics and to obtain an echo to rule out an infection in her heart. Discussed plan to continue having her get up and walk with physical therapy.  Objective:  Vital signs in last 24 hours: Vitals:   06/20/22 1555 06/20/22 2000 06/20/22 2352 06/21/22 0342  BP: 135/87 (!) 87/70 112/75 107/67  Pulse: 72 71 70 73  Resp: '17 20 15 15  '$ Temp: (!) 97.5 F (36.4 C) (!) 97.4 F (36.3 C) (!) 97.4 F (36.3 C) (!) 97.5 F (36.4 C)  TempSrc: Oral Oral Oral Oral  SpO2: 98% 98% 97% 99%  Weight:      Height:       Physical Exam: General: resting in bed comfortably, not in acute distress  HENT: 2X healed 1 cm circular abrasions on the left eyebrow  Pulmonary: normal respiratory effort, minimal wheezing, no rales or rhochi  Cardiac: regular rate and rhythm, 1+ edema of bilateral lower extremities Skin: chronic venous stasis changes on bilateral lower extremities, RLE without erythema or warmth  MSK: mild tenderness with ROM of the right ankle in all directions, good ROM of the right ankle Neurologic: Aox3, responding appropriately  Assessment/Plan:  Principal Problem:   Sepsis due to cellulitis [facility]) Active Problems:   ICD (implantable cardioverter-defibrillator) in place   Uncontrolled type 2 diabetes mellitus with hyperglycemia (HCC)   Restrictive lung disease   Chronic combined systolic and diastolic congestive heart failure (HCC)   Sepsis (HCC)   Pressure injury of skin  [name] is a 71 y/o female with past medical history of HFrEF s/p ICD, restrictive lung disease, HTN, HLD, and T2DM who was admitted for sepsis due to strep bacteremia.   # Sepsis secondary to [place]  bacteremia # RLE Cellulitis Suspect sepsis is secondary to bacteremia from right lower extremity cellulitis. Erythema today remains improved. Blood pressure remained stable. Remains afebrile but mild hypothermia overnight at 97.4 F. TEE scheduled for 9/21 to rule out seeding of her ICD. Right ankle pain is improved today but still having some soreness.  Patient states that she has been unable to get up and walk with physical therapy however the physical therapy note states that the patient is declining to work with them. Has received antibiotics for total of 5 days to date. No leukocytosis today. Repeat blood cultures negative to date. Will continue antibiotics until TEE results and reassess need for antibiotic therapy.  - Cefazolin 2g q8 (day 3) - Tylenol  - Trend CBC   2. # Acute kidney injury Baseline Cr 0.8-1. Received IV fluids. Cr mildly elevated today but stable at 1.06.  - Continue holding spironolactone  - Trend BMP   3. # Non-ischemic cardiomyopathy s/p implantable cardiac defibrillator # HTN EF 20-25% on most recent echo.  Lower extremity edema unchanged today. - Metoprolol  - Entresto - Holding spironolactone due to AKI - Strict I/Os w/ daily weights   4. # Fall Patient prefers [facility] services over recommended SNF. Mentation remains improved.  - Will order [facility] - [facility] PT/OT w/ tub/shower seat   5. # Restrictive lung disease On 2L [place] at home at night. Minimal wheezing on exam today. Satting well on RA  during interview.  - Albuterol 108ug q6 PRN - Umeclidinium-vilanterol 62.5-'25mg'$  q24   6. # Type II diabetes A1C 10.7 in 01/2022. Glucose in low 100s overnight. Patient is having low blood sugar despite not requiring insulin. TSH normal. - Ordering ACTH stimulation test for tomorrow morning to assess for adrenal insufficiency  - Holding semglee due to lows - SSI (Aspart 0-15 units 3X daily w/ meals) - Monitor CBG   7. # Dispo Patient lives at home by herself but has a family friend that  checks on her several days per week although not every day.  Daughter is unable to stay with the patient given that she works 8-5 regularly.   Diet:  DYS 3 VTE: Enoxaparin IVF: LR  Code: Full   Prior to Admission Living Arrangement: home living alone Anticipated Discharge Location: TBD Barriers to Discharge: continued management Dispo: Anticipated discharge in approximately more than 2 day(s).   [name], MD 06/21/2022, 5:19 AM Pager: [phone] After 5pm on weekdays and 1pm on weekends: On Call pager [phone]

## 2022-06-21 NOTE — Progress Notes (Signed)
Occupational Therapy Treatment Patient Details Name: [name] MRN: [id number] DOB: [birth year] Today's Date: 06/21/2022   History of present illness 71 yo female admitted with sepsis due to cellulitis PMH ICD, DM2 restrictive lung disease CHF smoker   OT comments  Pt making steady progress towards OT goals this session. Pt continues to present with decreased activity tolerance, and generalized deconditioning . Pt currently requires min guard assist for functional mobility greater than a household distance with RW,  min guard for LB ADLs and supervision for seated UB ADLs. Pt continues to decline SNF however pt lives at home alone, feel SNF is safest recommendation until pt can return home and be MODI in the home however if pt continues to decline SNF pt would require HH and supervision for functional mobility, ADLS and IADLS. Pt would continue to benefit from skilled occupational therapy while admitted and after d/c to address the below listed limitations in order to improve overall functional mobility and facilitate independence with BADL participation. DC plan remains appropriate, will follow acutely per POC.      Recommendations for follow up therapy are one component of a multi-disciplinary discharge planning process, led by the attending physician.  Recommendations may be updated based on patient status, additional functional criteria and insurance authorization.    Follow Up Recommendations  Skilled nursing-short term rehab (<3 hours/day)    Assistance Recommended at Discharge Intermittent Supervision/Assistance  Patient can return home with the following  Two people to help with walking and/or transfers;Two people to help with bathing/dressing/bathroom   Equipment Recommendations  Tub/shower seat    Recommendations for Other Services      Precautions / Restrictions Precautions Precautions: Fall Restrictions Weight Bearing Restrictions: No       Mobility Bed  Mobility Overal bed mobility: Needs Assistance Bed Mobility: Supine to Sit, Sit to Supine     Supine to sit: Min guard Sit to supine: Min assist   General bed mobility comments: pt has flat bed at home, had pt completed supine>sit from flat HOB with no rails however pt needed assist to elevate BLEs back to bed and used railing to transition supine>sit despite cueing pt to try without    Transfers Overall transfer level: Needs assistance Equipment used: Rolling walker (2 wheels) Transfers: Sit to/from Stand Sit to Stand: Min guard, From elevated surface           General transfer comment: min guard to rise from EOB for safety and line mgmt     Balance Overall balance assessment: Needs assistance Sitting-balance support: No upper extremity supported, Feet supported Sitting balance-Leahy Scale: Fair     Standing balance support: Bilateral upper extremity supported Standing balance-Leahy Scale: Poor Standing balance comment: reliant on BUE support for functional mobility                           ADL either performed or assessed with clinical judgement   ADL Overall ADL's : Needs assistance/impaired             Lower Body Bathing: Min guard;Sitting/lateral leans Lower Body Bathing Details (indicate cue type and reason): simulated with pt applying lotion to LB from EOB Upper Body Dressing : Supervision/safety;Sitting Upper Body Dressing Details (indicate cue type and reason): to don backside gown Lower Body Dressing: Min guard;Sitting/lateral leans Lower Body Dressing Details (indicate cue type and reason): to adjust socks from EOB Toilet Transfer: Min guard;Rolling walker (2 wheels) Toilet Transfer Details (indicate  cue type and reason): simulated via functional mobility in hallway with Rw       Tub/Shower Transfer Details (indicate cue type and reason): discussed need for TTB at home Functional mobility during ADLs: Min guard;Rolling walker (2  wheels) General ADL Comments: ADL participation limited by decreased activity tolerance and generalized deconditioning    Extremity/Trunk Assessment Upper Extremity Assessment Upper Extremity Assessment: Generalized weakness   Lower Extremity Assessment Lower Extremity Assessment: Defer to PT evaluation        Vision Baseline Vision/History: 1 Wears glasses (readers)     Perception Perception Perception: Within Functional Limits   Praxis Praxis Praxis: Intact    Cognition Arousal/Alertness: Awake/alert Behavior During Therapy: WFL for tasks assessed/performed Overall Cognitive Status: Within Functional Limits for tasks assessed                                          Exercises      Shoulder Instructions       General Comments pt on RA during session, unable to obtain accurate SpO2 reaching however no increased WOB during functional mobility    Pertinent Vitals/ Pain       Pain Assessment Pain Assessment: No/denies pain  Home Living                                          Prior Functioning/Environment              Frequency  Min 2X/week        Progress Toward Goals  OT Goals(current goals can now be found in the care plan section)  Progress towards OT goals: Progressing toward goals  Acute Rehab OT Goals Patient Stated Goal: to go home OT Goal Formulation: With patient Time For Goal Achievement: 07/01/22 Potential to Achieve Goals: Good  Plan Discharge plan remains appropriate;Frequency remains appropriate    Co-evaluation                 AM-PAC OT "6 Clicks" Daily Activity     Outcome Measure   Help from another person eating meals?: None Help from another person taking care of personal grooming?: A Little Help from another person toileting, which includes using toliet, bedpan, or urinal?: A Little Help from another person bathing (including washing, rinsing, drying)?: A Lot Help from another  person to put on and taking off regular upper body clothing?: A Little Help from another person to put on and taking off regular lower body clothing?: A Little 6 Click Score: 18    End of Session Equipment Utilized During Treatment: Gait belt;Rolling walker (2 wheels)  OT Visit Diagnosis: Unsteadiness on feet (R26.81);Muscle weakness (generalized) (M62.81)   Activity Tolerance Patient tolerated treatment well   Patient Left in bed;with call bell/phone within reach;with bed alarm set   Nurse Communication Mobility status        Time: 7035-0093 OT Time Calculation (min): 25 min  Charges: OT General Charges $OT Visit: 1 Visit OT Treatments $Self Care/Home Management : 23-37 mins  Harley Alto., COTA/L Acute Rehabilitation Services [phone]  Precious Haws 06/21/2022, 11:12 AM

## 2022-06-21 NOTE — Progress Notes (Signed)
    [facility] for Infectious Disease    Date of Admission:  06/17/2022   Total days of antibiotics 3           ID: [name] is a 71 y.o. female with  streptococcal bacteremia 2/2 cellulitis Principal Problem:   Sepsis due to cellulitis [facility]) Active Problems:   ICD (implantable cardioverter-defibrillator) in place   Uncontrolled type 2 diabetes mellitus with hyperglycemia ([place])   Restrictive lung disease   Chronic combined systolic and diastolic congestive heart failure (HCC)   Sepsis (HCC)   Pressure injury of skin    Subjective: Afebrile, improvement in right lower leg -less swelling less pain  Medications:   aspirin EC  81 mg Oral Daily   atorvastatin  80 mg Oral QHS   [START ON 06/22/2022] cosyntropin  0.25 mg Intravenous Once   enoxaparin (LOVENOX) injection  40 mg Subcutaneous Q24H   metoprolol succinate  50 mg Oral Daily   umeclidinium-vilanterol  1 puff Inhalation Daily    Objective: Vital signs in last 24 hours: Temp:  [97.4 F (36.3 C)-97.5 F (36.4 C)] 97.4 F (36.3 C) (09/19 1258) Pulse Rate:  [70-73] 72 (09/19 1258) Resp:  [15-27] 27 (09/19 1258) BP: (87-112)/(61-75) 101/61 (09/19 1258) SpO2:  [97 %-99 %] 99 % (09/19 0342) Weight:  [92.9 kg] 92.9 kg (09/19 0528)  Physical Exam  Constitutional:  oriented to person, place, and time. appears well-developed and well-nourished. No distress.  HENT: [place]/AT, PERRLA, no scleral icterus Mouth/Throat: Oropharynx is clear and moist. No oropharyngeal exudate.  Cardiovascular: Normal rate, regular rhythm and normal heart sounds. Exam reveals no gallop and no friction rub.  No murmur heard.  Pulmonary/Chest: Effort normal and breath sounds normal. No respiratory distress.  has no wheezes.  Neck = supple, no nuchal rigidity Abdominal: Soft. Bowel sounds are normal.  exhibits no distension. There is no tenderness.  Lymphadenopathy: no cervical adenopathy. No axillary adenopathy Neurological: alert and oriented  to person, place, and time.  Skin: Skin is warm and dry. +1 pitting edema less erythema to right lower leg Psychiatric: a normal mood and affect.  behavior is normal.    Lab Results Recent Labs    06/20/22 0730 06/21/22 0645  WBC 7.9 8.2  HGB 12.4 11.8*  HCT 38.0 37.3  NA 141 141  K 3.7 4.1  CL 107 107  CO2 26 29  BUN 18 17  CREATININE 1.12* 1.06*     Microbiology: 9/18 blood cx pending 9/15 1 of 2 sets + for streptococcal group G Studies/Results: No results found.   Assessment/Plan: Streptococcal bacteremia 2/2 cellulitis = continue with cefazolin 2gm IV q 8hr for the time being. TEE scheduled for 8/21 to decide on length of therapy and rule out endocarditis  Cellulitis = improving with abtx; once back to baseline consider compression socks  [facility] for Infectious Diseases Pager: [phone]  06/21/2022, 4:23 PM

## 2022-06-21 NOTE — Progress Notes (Signed)
Hypoglycemic Event  CBG: 58  Treatment: 4 oz juice/soda  Symptoms: None  Follow-up CBG: Time: 0650 CBG Result:63  Treatment: Crackers and peanut butter  Symptoms: None  Follow-up CBG: Time: 0715 CBG Result: 63  Possible Reasons for Event: Unknown

## 2022-06-21 NOTE — Progress Notes (Signed)
PT Cancellation Note  Patient Details Name: [name] MRN: [id number] DOB: [birth year]   Cancelled Treatment:    Reason Eval/Treat Not Completed: Patient declined, no reason specified (attempted to see 2x with pt initially eating breakfast, now just finished with OT and fatigued)   [name] [name] 06/21/2022, 9:45 AM [name], [name] Office: [phone]

## 2022-06-21 NOTE — Progress Notes (Signed)
Mobility Specialist Progress Note    06/21/22 1431  Mobility  Activity Ambulated with assistance in hallway  Level of Assistance Minimal assist, patient does 75% or more  Assistive Device Front wheel walker  Distance Ambulated (ft) 100 ft  Activity Response Tolerated well  $Mobility charge 1 Mobility   Pre-Mobility: 86 HR Post-Mobility: 75 HR  Pt received in bed and agreeable. No complaints on walk. Returned to bed with call bell in reach.    Hildred Alamin Mobility Specialist

## 2022-06-22 DIAGNOSIS — L0390 Cellulitis, unspecified: Secondary | ICD-10-CM | POA: Diagnosis not present

## 2022-06-22 DIAGNOSIS — A419 Sepsis, unspecified organism: Secondary | ICD-10-CM | POA: Diagnosis not present

## 2022-06-22 LAB — ACTH STIMULATION, 3 TIME POINTS
Cortisol, 30 Min: 17.4 ug/dL
Cortisol, 60 Min: 19 ug/dL
Cortisol, Base: 9.8 ug/dL

## 2022-06-22 LAB — BASIC METABOLIC PANEL
Anion gap: 8 (ref 5–15)
BUN: 18 mg/dL (ref 8–23)
CO2: 26 mmol/L (ref 22–32)
Calcium: 8.4 mg/dL — ABNORMAL LOW (ref 8.9–10.3)
Chloride: 108 mmol/L (ref 98–111)
Creatinine, Ser: 1 mg/dL (ref 0.44–1.00)
GFR, Estimated: >60 mL/min (ref >60)
Glucose, Bld: 140 mg/dL — ABNORMAL HIGH (ref 70–99)
Potassium: 4.4 mmol/L (ref 3.5–5.1)
Sodium: 142 mmol/L (ref 135–145)

## 2022-06-22 LAB — CBC
HCT: 37.1 % (ref 36.0–46.0)
Hemoglobin: 11.6 g/dL — ABNORMAL LOW (ref 12.0–15.0)
MCH: 26.1 pg (ref 26.0–34.0)
MCHC: 31.3 g/dL (ref 30.0–36.0)
MCV: 83.4 fL (ref 80.0–100.0)
Platelets: 303 10*3/uL (ref 150–400)
RBC: 4.45 MIL/uL (ref 3.87–5.11)
RDW: 16.3 % — ABNORMAL HIGH (ref 11.5–15.5)
WBC: 8.6 10*3/uL (ref 4.0–10.5)
nRBC: 0 % (ref 0.0–0.2)

## 2022-06-22 LAB — GLUCOSE, CAPILLARY
Glucose-Capillary: 101 mg/dL — ABNORMAL HIGH (ref 70–99)
Glucose-Capillary: 139 mg/dL — ABNORMAL HIGH (ref 70–99)
Glucose-Capillary: 277 mg/dL — ABNORMAL HIGH (ref 70–99)
Glucose-Capillary: 297 mg/dL — ABNORMAL HIGH (ref 70–99)

## 2022-06-22 LAB — CULTURE, BLOOD (ROUTINE X 2)
Culture: NO GROWTH
Special Requests: ADEQUATE

## 2022-06-22 LAB — CORTISOL: Cortisol, Plasma: 17.6 ug/dL

## 2022-06-22 NOTE — Progress Notes (Addendum)
Subjective:   Patient is feeling well this morning. She states that she has been getting up and walking with the mobility specialist.  She states that she still has some pain in her right ankle that is unchanged from yesterday.  Discussed that we could potentially have to place a midline IV on her given several attempts to obtain a peripheral IV this morning. Discussed plan for echo tomorrow.  Objective:  Vital signs in last 24 hours: Vitals:   06/21/22 1949 06/21/22 2345 06/22/22 0317 06/22/22 0428  BP: 119/84 (!) 100/49 106/79   Pulse:  70 65   Resp: '18 20 17   '$ Temp: (!) 97.5 F (36.4 C) (!) 97.5 F (36.4 C) (!) 97.5 F (36.4 C)   TempSrc: Oral Oral Oral   SpO2: 98% 96% 93%   Weight:    93.3 kg  Height:       Physical Exam: General: resting in bed comfortably, not in acute distress  Pulmonary: normal respiratory effort, no wheezing, rales, or rhochi  Cardiac: regular rate and rhythm, 1+ edema of bilateral lower extremities Skin: bilateral LE w/ chronic venous stasis changes, no erythema or warmth of the LE MSK: pain w/ ROM of the right ankle in all directions, good ROM of the right ankle Neurologic: alert and oriented x3, responding appropriately  Assessment/Plan:  Principal Problem:   Sepsis due to cellulitis [facility]) Active Problems:   ICD (implantable cardioverter-defibrillator) in place   Uncontrolled type 2 diabetes mellitus with hyperglycemia (HCC)   Restrictive lung disease   Chronic combined systolic and diastolic congestive heart failure (HCC)   Sepsis ([place])   Pressure injury of skin  [name] is a 71 y/o female with past medical history of HFrEF s/p ICD, restrictive lung disease, HTN, HLD, and T2DM who was admitted for sepsis due to strep bacteremia.   # Sepsis secondary to [place] bacteremia # RLE Cellulitis Erythema remains resolved today. Blood pressure remains low-normal. Still afebrile but continues to have mild hypothermia overnight at 97.4 F. Awaiting  TEE on 9/21 to r/o seeding of ICD. Right ankle pain unchanged today. Patient has been declining to work with PT but is up and walking with the mobility specialist. Encouraged continuing to work with PT and mobility specialist. Has received antibiotics for total of 6 days to date. Repeat blood cultures remain negative. Will continue with antibiotics until TEE results and then reassess. Had several IV blown this morning. Nursing staff was able to secure a peripheral IV. May need to place a midline today if peripheral IV fails.  - Cefazolin 2g q8 (day 4) - Tylenol  - Trend CBC   2. # Acute kidney injury Baseline Cr 0.8-1. Cr minimally elevated but stable.  - Holding spironolactone  - Trend BMP   3. # Non-ischemic cardiomyopathy s/p implantable cardiac defibrillator # HTN EF 20-25% in 06/2021. Mild lower extremity edema unchanged today. - Metoprolol  - Hold Entresto due to low-normal BP - Holding spironolactone due to AKI - Strict I/Os w/ daily weights   4. # Fall Patient denied PT recommendations for SNF. Patient prefers [facility] services. - HH PT/OT w/ tub/shower seat at discharge   5. # Restrictive lung disease Uses 2L [place] at home at night. Wheezing resolved today. Satting well on RA this morning.   - Albuterol 108ug q6 PRN - Umeclidinium-vilanterol 62.5-'25mg'$  q24   6. # Type II diabetes A1C 10.7 in 01/2022. Continues to have low blood sugar overnight despite holding insulin. TSH WNL. Will  assess for adrenal insufficiency.  - Pending cortisol/ACTH stimulation test  - Holding semglee and SSI due to lows - Monitor CBG   7. # Dispo Patient lives alone. Family friend visits several days a week. Daughter can not watch the patient due to work schedule.   Diet:  HH VTE: Enoxaparin IVF: LR  Code: Full   Prior to Admission Living Arrangement: home living alone Anticipated Discharge Location: TBD Barriers to Discharge: continued management Dispo: Anticipated discharge in approximately more  than 2 day(s).   [name], MD 06/22/2022, 6:09 AM Pager: [phone] After 5pm on weekdays and 1pm on weekends: On Call pager [phone]

## 2022-06-22 NOTE — Care Management Important Message (Signed)
Important Message  Patient Details  Name: [name] MRN: [id number] Date of Birth: [birth year]   Medicare Important Message Given:  Yes     Shelda Altes 06/22/2022, 2:46 PM

## 2022-06-22 NOTE — TOC Progression Note (Signed)
Transition of Care [facility]) - Progression Note    Patient Details  Name: [name] MRN: [id number] Date of Birth: Oct 04, 1950  Transition of Care [facility]) CM/SW [place], RN Phone Number:[phone]  06/22/2022, 9:15 AM  Clinical Narrative:    CM at bedside to offer patient choice for home health services. Patients states that she does not have a preference as long as insurance will pay.  Home health referral has been submitted to [facility].  Acceptance pending.   Expected Discharge Plan: [place] Barriers to Discharge: Continued Medical Work up  Expected Discharge Plan and Services Expected Discharge Plan: [place] In-house Referral: Clinical Social Work   Post Acute Care Choice: Home Health, Durable Medical Equipment Living arrangements for the past 2 months: Apartment                 DME Arranged: Shower stool DME Agency: [employer] Date DME Agency Contacted: 06/21/22 Time DME Agency Contacted: [phone] Representative spoke with at DME Agency: [place] Determinants of Health ([place]) Interventions    Readmission Risk Interventions     No data to display

## 2022-06-22 NOTE — Progress Notes (Signed)
Mobility Specialist Progress Note    06/22/22 1139  Mobility  Activity Ambulated with assistance in hallway  Level of Assistance Minimal assist, patient does 75% or more  Assistive Device Front wheel walker  Distance Ambulated (ft) 90 ft  Activity Response Tolerated well  $Mobility charge 1 Mobility   Pre-Mobility: 71 HR Post-Mobility: 70 HR  Pt received in bed and agreeable. No complaints on walk. Returned to chair with call bell in reach.    Hildred Alamin Mobility Specialist

## 2022-06-22 NOTE — H&P (View-Only) (Signed)
    [facility] for Infectious Disease    Date of Admission:  06/17/2022      ID: [name] is a 71 y.o. female with  cellulitis with secondary streptococcal bacteremia Principal Problem:   Sepsis due to cellulitis [facility]) Active Problems:   ICD (implantable cardioverter-defibrillator) in place   Uncontrolled type 2 diabetes mellitus with hyperglycemia ([place])   Restrictive lung disease   Chronic combined systolic and diastolic congestive heart failure (HCC)   Sepsis (HCC)   Pressure injury of skin    Subjective: Afebrile, still some shortness of breath at rest; also mild pain to right lower leg  Medications:   aspirin EC  81 mg Oral Daily   atorvastatin  80 mg Oral QHS   enoxaparin (LOVENOX) injection  40 mg Subcutaneous Q24H   metoprolol succinate  50 mg Oral Daily   umeclidinium-vilanterol  1 puff Inhalation Daily    Objective: Vital signs in last 24 hours: Temp:  [97.5 F (36.4 C)-97.8 F (36.6 C)] 97.8 F (36.6 C) (09/20 0739) Pulse Rate:  [65-70] 65 (09/20 0317) Resp:  [16-20] 16 (09/20 1218) BP: (93-121)/(49-91) 119/91 (09/20 1218) SpO2:  [93 %-98 %] 93 % (09/20 0317) Weight:  [93.3 kg] 93.3 kg (09/20 0428) Physical Exam  Constitutional:  oriented to person, place, and time. appears well-developed and well-nourished. No distress.  HENT: [place]/AT, PERRLA, no scleral icterus Mouth/Throat: Oropharynx is clear and moist. No oropharyngeal exudate.  Cardiovascular: Normal rate, regular rhythm and normal heart sounds. Exam reveals no gallop and no friction rub.  No murmur heard.  Pulmonary/Chest: Effort normal and breath sounds normal. No respiratory distress.  has no wheezes.  Neck = supple, no nuchal rigidity Abdominal: Soft. Bowel sounds are normal.  exhibits no distension. There is no tenderness.  Lymphadenopathy: no cervical adenopathy. No axillary adenopathy Neurological: alert and oriented to person, place, and time.  Skin: Skin is warm and dry. No rash noted.  No erythema. +pitting edema trace Psychiatric: a normal mood and affect.  behavior is normal.    Lab Results Recent Labs    06/21/22 0645 06/22/22 0636 06/22/22 1005  WBC 8.2 8.6  --   HGB 11.8* 11.6*  --   HCT 37.3 37.1  --   NA 141  --  142  K 4.1  --  4.4  CL 107  --  108  CO2 29  --  26  BUN 17  --  18  CREATININE 1.06*  --  1.00     Microbiology: 9/18 blood cx NGTD at 48hrs Studies/Results: No results found.   Assessment/Plan: Streptococcal bacteremia = on cefazolin 2gm IV Q 8hr for the time being. Await for TEE tomorrow to see what Length of Therapy will be.aim to rule out cardiac device endocarditis  Cellulitis = continues to improve on iv abtx. Consider raising legs to decrease pedal edema  [facility] for Infectious Diseases Pager: [phone]  06/22/2022, 2:43 PM

## 2022-06-22 NOTE — Progress Notes (Signed)
    [facility] for Infectious Disease    Date of Admission:  06/17/2022      ID: [name] is a 71 y.o. female with  cellulitis with secondary streptococcal bacteremia Principal Problem:   Sepsis due to cellulitis [facility]) Active Problems:   ICD (implantable cardioverter-defibrillator) in place   Uncontrolled type 2 diabetes mellitus with hyperglycemia ([place])   Restrictive lung disease   Chronic combined systolic and diastolic congestive heart failure (HCC)   Sepsis (HCC)   Pressure injury of skin    Subjective: Afebrile, still some shortness of breath at rest; also mild pain to right lower leg  Medications:   aspirin EC  81 mg Oral Daily   atorvastatin  80 mg Oral QHS   enoxaparin (LOVENOX) injection  40 mg Subcutaneous Q24H   metoprolol succinate  50 mg Oral Daily   umeclidinium-vilanterol  1 puff Inhalation Daily    Objective: Vital signs in last 24 hours: Temp:  [97.5 F (36.4 C)-97.8 F (36.6 C)] 97.8 F (36.6 C) (09/20 0739) Pulse Rate:  [65-70] 65 (09/20 0317) Resp:  [16-20] 16 (09/20 1218) BP: (93-121)/(49-91) 119/91 (09/20 1218) SpO2:  [93 %-98 %] 93 % (09/20 0317) Weight:  [93.3 kg] 93.3 kg (09/20 0428) Physical Exam  Constitutional:  oriented to person, place, and time. appears well-developed and well-nourished. No distress.  HENT: [place]/AT, PERRLA, no scleral icterus Mouth/Throat: Oropharynx is clear and moist. No oropharyngeal exudate.  Cardiovascular: Normal rate, regular rhythm and normal heart sounds. Exam reveals no gallop and no friction rub.  No murmur heard.  Pulmonary/Chest: Effort normal and breath sounds normal. No respiratory distress.  has no wheezes.  Neck = supple, no nuchal rigidity Abdominal: Soft. Bowel sounds are normal.  exhibits no distension. There is no tenderness.  Lymphadenopathy: no cervical adenopathy. No axillary adenopathy Neurological: alert and oriented to person, place, and time.  Skin: Skin is warm and dry. No rash noted.  No erythema. +pitting edema trace Psychiatric: a normal mood and affect.  behavior is normal.    Lab Results Recent Labs    06/21/22 0645 06/22/22 0636 06/22/22 1005  WBC 8.2 8.6  --   HGB 11.8* 11.6*  --   HCT 37.3 37.1  --   NA 141  --  142  K 4.1  --  4.4  CL 107  --  108  CO2 29  --  26  BUN 17  --  18  CREATININE 1.06*  --  1.00     Microbiology: 9/18 blood cx NGTD at 48hrs Studies/Results: No results found.   Assessment/Plan: Streptococcal bacteremia = on cefazolin 2gm IV Q 8hr for the time being. Await for TEE tomorrow to see what Length of Therapy will be.aim to rule out cardiac device endocarditis  Cellulitis = continues to improve on iv abtx. Consider raising legs to decrease pedal edema  [facility] for Infectious Diseases Pager: [phone]  06/22/2022, 2:43 PM

## 2022-06-22 NOTE — Progress Notes (Signed)
Physical Therapy Treatment Patient Details Name: [name] MRN: [id number] DOB: 1951/09/06 Today's Date: 06/22/2022   History of Present Illness 71 yo female admitted with sepsis due to cellulitis PMH ICD, DM2 restrictive lung disease CHF smoker    PT Comments    Pt received seated EOB and agreeable to session despite endorsing increased fatigue this date. Pt demonstrating increased ambulation tolerance with min guard and RW, with cues throughout for RW proximity and upright posture. Pt with slow step through gait however no overt LOB noted. Pt with good recall throughout session for safe hand placement during transfers and able to complete with min guard for safety. Pt continues to require assist for bed mobility to manage BLEs into bed. Pt VSS on RA throughout. Pt continues to benefit from skilled PT services to progress toward functional mobility goals.    Recommendations for follow up therapy are one component of a multi-disciplinary discharge planning process, led by the attending physician.  Recommendations may be updated based on patient status, additional functional criteria and insurance authorization.  Follow Up Recommendations  Home health PT     Assistance Recommended at Discharge Frequent or constant Supervision/Assistance  Patient can return home with the following A little help with walking and/or transfers;A little help with bathing/dressing/bathroom;Assistance with cooking/housework;Assist for transportation;Help with stairs or ramp for entrance   Equipment Recommendations  None recommended by PT    Recommendations for Other Services       Precautions / Restrictions Precautions Precautions: Fall Restrictions Weight Bearing Restrictions: No     Mobility  Bed Mobility Overal bed mobility: Needs Assistance Bed Mobility: Sit to Supine       Sit to supine: Min assist   General bed mobility comments: had pt completed sit>supine with flat HOB with no rails  however pt needed assist to elevate BLEs back to bed and used railing to transition  despite cueing pt to try without    Transfers Overall transfer level: Needs assistance Equipment used: Rolling walker (2 wheels) Transfers: Sit to/from Stand Sit to Stand: Min guard           General transfer comment: min guard from recliner with good recall of safe hand placement    Ambulation/Gait Ambulation/Gait assistance: Min guard Gait Distance (Feet): 100 Feet Assistive device: Rolling walker (2 wheels) Gait Pattern/deviations: Step-through pattern, Decreased stride length, Wide base of support, Trunk flexed Gait velocity: decr     General Gait Details: slow steady gait with RW, cues for posture and RW proximity   Stairs             Wheelchair Mobility    Modified Rankin (Stroke Patients Only)       Balance Overall balance assessment: Needs assistance Sitting-balance support: No upper extremity supported, Feet supported Sitting balance-Leahy Scale: Fair     Standing balance support: Bilateral upper extremity supported Standing balance-Leahy Scale: Poor Standing balance comment: reliant on BUE support for functional mobility                            Cognition Arousal/Alertness: Awake/alert Behavior During Therapy: WFL for tasks assessed/performed Overall Cognitive Status: Within Functional Limits for tasks assessed                                          Exercises      General  Comments        Pertinent Vitals/Pain Pain Assessment Pain Assessment: Faces Faces Pain Scale: Hurts a little bit Pain Location: RLE Pain Descriptors / Indicators: Aching, Sore Pain Intervention(s): Monitored during session, Limited activity within patient's tolerance    Home Living                          Prior Function            PT Goals (current goals can now be found in the care plan section) Acute Rehab PT Goals Patient  Stated Goal: be able to return home PT Goal Formulation: With patient Time For Goal Achievement: 07/02/22    Frequency    Min 3X/week      PT Plan      Co-evaluation              AM-PAC PT "6 Clicks" Mobility   Outcome Measure  Help needed turning from your back to your side while in a flat bed without using bedrails?: A Lot Help needed moving from lying on your back to sitting on the side of a flat bed without using bedrails?: A Lot Help needed moving to and from a bed to a chair (including a wheelchair)?: A Little Help needed standing up from a chair using your arms (e.g., wheelchair or bedside chair)?: A Little Help needed to walk in hospital room?: A Lot Help needed climbing 3-5 steps with a railing? : A Lot 6 Click Score: 14    End of Session   Activity Tolerance: Patient limited by fatigue Patient left: with call bell/phone within reach;in bed Nurse Communication: Mobility status PT Visit Diagnosis: Other abnormalities of gait and mobility (R26.89);Difficulty in walking, not elsewhere classified (R26.2)     Time: 7902-4097 PT Time Calculation (min) (ACUTE ONLY): 18 min  Charges:  $Gait Training: 8-22 mins                     Shamera Yarberry R. PTA Acute Rehabilitation Services Office: [place] 06/22/2022, 4:25 PM

## 2022-06-23 ENCOUNTER — Inpatient Hospital Stay (HOSPITAL_COMMUNITY): Payer: Medicare Other | Admitting: Anesthesiology

## 2022-06-23 ENCOUNTER — Inpatient Hospital Stay (HOSPITAL_COMMUNITY): Payer: Medicare Other

## 2022-06-23 ENCOUNTER — Encounter (HOSPITAL_COMMUNITY): Payer: Self-pay | Admitting: Student in an Organized Health Care Education/Training Program

## 2022-06-23 ENCOUNTER — Encounter (HOSPITAL_COMMUNITY)
Admission: EM | Disposition: A | Discharge status: Home / Self Care | Payer: Self-pay | Source: Home / Self Care | Attending: Student in an Organized Health Care Education/Training Program

## 2022-06-23 DIAGNOSIS — I110 Hypertensive heart disease with heart failure: Secondary | ICD-10-CM | POA: Diagnosis not present

## 2022-06-23 DIAGNOSIS — I351 Nonrheumatic aortic (valve) insufficiency: Secondary | ICD-10-CM | POA: Diagnosis not present

## 2022-06-23 DIAGNOSIS — A419 Sepsis, unspecified organism: Secondary | ICD-10-CM | POA: Diagnosis not present

## 2022-06-23 DIAGNOSIS — I082 Rheumatic disorders of both aortic and tricuspid valves: Secondary | ICD-10-CM

## 2022-06-23 DIAGNOSIS — R7881 Bacteremia: Secondary | ICD-10-CM | POA: Diagnosis not present

## 2022-06-23 DIAGNOSIS — I10 Essential (primary) hypertension: Secondary | ICD-10-CM | POA: Diagnosis not present

## 2022-06-23 DIAGNOSIS — F17210 Nicotine dependence, cigarettes, uncomplicated: Secondary | ICD-10-CM | POA: Diagnosis not present

## 2022-06-23 DIAGNOSIS — L0390 Cellulitis, unspecified: Secondary | ICD-10-CM | POA: Diagnosis not present

## 2022-06-23 DIAGNOSIS — I509 Heart failure, unspecified: Secondary | ICD-10-CM

## 2022-06-23 HISTORY — PX: TEE WITHOUT CARDIOVERSION: SHX5443

## 2022-06-23 LAB — CBC WITH DIFFERENTIAL/PLATELET
Abs Immature Granulocytes: 0.12 10*3/uL — ABNORMAL HIGH (ref 0.00–0.07)
Basophils Absolute: 0 10*3/uL (ref 0.0–0.1)
Basophils Relative: 1 %
Eosinophils Absolute: 0.2 10*3/uL (ref 0.0–0.5)
Eosinophils Relative: 3 %
HCT: 38.1 % (ref 36.0–46.0)
Hemoglobin: 12.2 g/dL (ref 12.0–15.0)
Immature Granulocytes: 2 %
Lymphocytes Relative: 16 %
Lymphs Abs: 1.3 10*3/uL (ref 0.7–4.0)
MCH: 26.2 pg (ref 26.0–34.0)
MCHC: 32 g/dL (ref 30.0–36.0)
MCV: 81.8 fL (ref 80.0–100.0)
Monocytes Absolute: 0.6 10*3/uL (ref 0.1–1.0)
Monocytes Relative: 8 %
Neutro Abs: 5.8 10*3/uL (ref 1.7–7.7)
Neutrophils Relative %: 70 %
Platelets: 303 10*3/uL (ref 150–400)
RBC: 4.66 MIL/uL (ref 3.87–5.11)
RDW: 16.2 % — ABNORMAL HIGH (ref 11.5–15.5)
WBC: 8.1 10*3/uL (ref 4.0–10.5)
nRBC: 0 % (ref 0.0–0.2)

## 2022-06-23 LAB — GLUCOSE, CAPILLARY
Glucose-Capillary: 225 mg/dL — ABNORMAL HIGH (ref 70–99)
Glucose-Capillary: 125 mg/dL — ABNORMAL HIGH (ref 70–99)
Glucose-Capillary: 235 mg/dL — ABNORMAL HIGH (ref 70–99)

## 2022-06-23 LAB — POCT I-STAT, CHEM 8
BUN: 31 mg/dL — ABNORMAL HIGH (ref 8–23)
Calcium, Ion: 1.16 mmol/L (ref 1.15–1.40)
Chloride: 104 mmol/L (ref 98–111)
Creatinine, Ser: 1.1 mg/dL — ABNORMAL HIGH (ref 0.44–1.00)
Glucose, Bld: 123 mg/dL — ABNORMAL HIGH (ref 70–99)
HCT: 40 % (ref 36.0–46.0)
Hemoglobin: 13.6 g/dL (ref 12.0–15.0)
Potassium: 5.2 mmol/L — ABNORMAL HIGH (ref 3.5–5.1)
Sodium: 141 mmol/L (ref 135–145)
TCO2: 31 mmol/L (ref 22–32)

## 2022-06-23 LAB — BASIC METABOLIC PANEL
Anion gap: 8 (ref 5–15)
BUN: 20 mg/dL (ref 8–23)
CO2: 26 mmol/L (ref 22–32)
Calcium: 8.7 mg/dL — ABNORMAL LOW (ref 8.9–10.3)
Chloride: 106 mmol/L (ref 98–111)
Creatinine, Ser: 1.12 mg/dL — ABNORMAL HIGH (ref 0.44–1.00)
GFR, Estimated: 54 mL/min — ABNORMAL LOW (ref >60)
Glucose, Bld: 224 mg/dL — ABNORMAL HIGH (ref 70–99)
Potassium: 5.4 mmol/L — ABNORMAL HIGH (ref 3.5–5.1)
Sodium: 140 mmol/L (ref 135–145)

## 2022-06-23 SURGERY — ECHOCARDIOGRAM, TRANSESOPHAGEAL
Anesthesia: Monitor Anesthesia Care

## 2022-06-23 MED ORDER — PHENYLEPHRINE 80 MCG/ML (10ML) SYRINGE FOR IV PUSH (FOR BLOOD PRESSURE SUPPORT)
PREFILLED_SYRINGE | INTRAVENOUS | Status: DC | PRN
  Administered 2022-06-23 (×4): 160 ug via INTRAVENOUS

## 2022-06-23 MED ORDER — PROPOFOL 500 MG/50ML IV EMUL
INTRAVENOUS | Status: DC | PRN
  Administered 2022-06-23: 80 ug/kg/min via INTRAVENOUS

## 2022-06-23 MED ORDER — EPHEDRINE SULFATE-NACL 50-0.9 MG/10ML-% IV SOSY
PREFILLED_SYRINGE | INTRAVENOUS | Status: DC | PRN
  Administered 2022-06-23: 10 mg via INTRAVENOUS
  Administered 2022-06-23: 5 mg via INTRAVENOUS

## 2022-06-23 MED ORDER — FENTANYL CITRATE (PF) 100 MCG/2ML IJ SOLN
INTRAMUSCULAR | Status: DC | PRN
  Administered 2022-06-23 (×2): 25 ug via INTRAVENOUS

## 2022-06-23 MED ORDER — PROPOFOL 10 MG/ML IV BOLUS
INTRAVENOUS | Status: DC | PRN
  Administered 2022-06-23 (×3): 10 mg via INTRAVENOUS

## 2022-06-23 MED ORDER — BUTAMBEN-TETRACAINE-BENZOCAINE 2-2-14 % EX AERO
INHALATION_SPRAY | CUTANEOUS | Status: DC | PRN
  Administered 2022-06-23: 2 via TOPICAL

## 2022-06-23 MED ORDER — FENTANYL CITRATE (PF) 100 MCG/2ML IJ SOLN
INTRAMUSCULAR | Status: AC
  Filled 2022-06-23: qty 2

## 2022-06-23 MED ORDER — PHENYLEPHRINE HCL-NACL 20-0.9 MG/250ML-% IV SOLN
INTRAVENOUS | Status: DC | PRN
  Administered 2022-06-23: 70 ug/min via INTRAVENOUS

## 2022-06-23 MED ORDER — VASOPRESSIN 20 UNIT/ML IV SOLN
INTRAVENOUS | Status: DC | PRN
  Administered 2022-06-23: 1 [IU] via INTRAVENOUS

## 2022-06-23 MED ORDER — CEFDINIR 300 MG PO CAPS
300.0000 mg | ORAL_CAPSULE | Freq: Two times a day (BID) | ORAL | Status: DC
  Administered 2022-06-23: 300 mg via ORAL
  Filled 2022-06-23 (×2): qty 1

## 2022-06-23 MED ORDER — SODIUM CHLORIDE 0.9 % IV SOLN: INTRAVENOUS | Status: DC

## 2022-06-23 MED ORDER — MIDAZOLAM HCL 2 MG/2ML IJ SOLN
INTRAMUSCULAR | Status: AC
  Filled 2022-06-23: qty 2

## 2022-06-23 NOTE — Discharge Instructions (Addendum)
You were hospitalized for sepsis likely from cellulitis.  Hospital Course: We treated your infection with antibiotics. You will go home on these antibiotics. You needed less insulin in the hospital than you did prior to being hospitalized. We decreased the amount of insulin that you will take at home. We stopped some of your blood pressure medications as your blood pressure was low during your stay.   Medications:  Please start taking: - Amoxicillin (take 2 capsules every 8 hours for 7 days) for your right ankle cellulitis   Please stop taking: - Sacubitril-valsartan - Spironolactone  Please change how you are taking: - Insulin degludec Tyler Aas): inject 10 units into the skin daily  Please continue taking: - Insulin Lispro (inject 6 units into the skin 3 times daily with meals) - Liraglutide (inject 1.8 mg into skin daily) - Lancets misc - Blood glucose monitoring supplies - Albuterol - Umeclidinium-vilanterol - Aspirin  - Atorvastatin - Calcium-vitamin D - Dapagliflozin - Continuous blood gluc receiver - Dupilumab - Furosemide - Lancets - Metoprolol Succinate - Insulin Pen Needle - Glucose blood test strip  Follow-up: - Please follow up with your primary care provider Dr. Alton Revere at the [facility] on 07/04/2022 at 10:15 AM.  Phone: [phone] Address: [facility], [place], [place], [place] [address]  - Home Health will call you about scheduling times to come to your house and work on improving your strength.

## 2022-06-23 NOTE — Progress Notes (Signed)
   [name] has been requested to perform a transesophageal echocardiogram on [name] for bacteremia.  After careful review of history and examination, the risks and benefits of transesophageal echocardiogram have been explained including risks of esophageal damage, perforation (1:10,000 risk), bleeding, pharyngeal hematoma as well as other potential complications associated with conscious sedation including aspiration, arrhythmia, respiratory failure and death. Alternatives to treatment were discussed, questions were answered. Patient is willing to proceed.   Procedure is scheduled for today at 1:00pm with Dr. Margaretann Loveless.  Darreld Mclean, PA-C 06/23/2022 8:09 AM

## 2022-06-23 NOTE — Progress Notes (Signed)
Subjective:   Patient is feeling well this morning and has no new complaints. She states that her right ankle pain is improving but still sore. States that she has been able to get up and walk with the mobility specialist. States she is eating well. Discussed plan for echo today to determine how long we need to treat her with antibiotics.   Objective:  Vital signs in last 24 hours: Vitals:   06/22/22 1948 06/23/22 0002 06/23/22 0335 06/23/22 0420  BP: (!) 126/59 114/60 106/69   Pulse: 70 72 80   Resp: '18 17 16   '$ Temp: 97.6 F (36.4 C) (!) 97.5 F (36.4 C) (!) 97.5 F (36.4 C)   TempSrc: Oral Oral Oral   SpO2: 95% 98% 96%   Weight:    94.1 kg  Height:       Physical Exam: General: sitting up in her chair, not in acute distress  Pulmonary: normal respiratory effort, no wheezing, rales, or rhochi  Cardiac: regular rate and rhythm, 1+ edema of bilateral LE Skin: Chronic venous stasis changes of LE, no erythema of LE, no warmth of the LE MSK: minimal pain w/ ROM of the right ankle in all directions, good ROM of the right ankle Neurologic: alert and oriented x3, responding appropriately   Assessment/Plan:  Principal Problem:   Sepsis due to cellulitis [facility]) Active Problems:   ICD (implantable cardioverter-defibrillator) in place   Uncontrolled type 2 diabetes mellitus with hyperglycemia (HCC)   Restrictive lung disease   Chronic combined systolic and diastolic congestive heart failure (HCC)   Sepsis ([place])   Pressure injury of skin  [name] is a 71 y/o female with past medical history of HFrEF s/p ICD, restrictive lung disease, HTN, HLD, and T2DM who was admitted for sepsis due to strep bacteremia.   # Sepsis secondary to [place] bacteremia # RLE Cellulitis Blood continues to be low-normal. Remains afebrile. Mildly hypothermic overnight at 97.5 F. Erythema remains resolved today. Still having some mild pain in the right ankle but remains improved. Patient is getting up to  walk with the mobility specialist. Repeat BC negative. 7 total days of antibiotics to date. Plan for TEE this afternoon to r/o seeding of ICD. Will continue antibiotics until results of TEE.  - Cefazolin 2g q8 (day 5) - Tylenol  - Trend CBC   2. # Acute kidney injury # Hyperkalemia Baseline Cr 0.8-1. Cr mildly worse today. Potassium 5.4 this morning. Hyperkalemia likely related due to insulin deficiency.  - Holding spironolactone  - Trend BMP   3. # Non-ischemic cardiomyopathy s/p implantable cardiac defibrillator # HTN 06/2021 EF 20-25%. LE edema stable today.  - Metoprolol  - Holding Entresto for low-normal BP - Holding spironolactone for AKI - Strict I/Os w/ daily weights   4. # Fall Patient declined PT SNF recommendation. Patient would prefer HH. - HH PT/OT w/ tub/shower chair at discharge   5. # Restrictive lung disease On 2L [place] at home during night. Lungs sound normal today. Satting well on RA.    - Albuterol 108ug q6 PRN - Umeclidinium-vilanterol 62.5-'25mg'$  q24   6. # Type II diabetes A1C 10.7 in 01/2022. Have been holding insulin due to lows overnight. TSH WNL. Cortisol and ACTH stimulation test not concerning for adrenal insufficiency. BGL increased to 297 overnight. Morning glucose 224. Will reassess need for insulin today following TEE and restarting diet.  - Holding semglee and SSI due to lows - Monitor CBG   7. # Dispo  Lives alone. Family friend visits frequently. Daughter cannot oversee patient due to work schedule.    Diet:  HH VTE: Enoxaparin IVF: LR  Code: Full   Prior to Admission Living Arrangement: home living alone Anticipated Discharge Location: TBD Barriers to Discharge: continued management Dispo: Anticipated discharge in approximately more than 2 day(s).    [name], MD 06/23/2022, 5:56 AM Pager: [phone] After 5pm on weekdays and 1pm on weekends: On Call pager [phone]

## 2022-06-23 NOTE — Anesthesia Procedure Notes (Signed)
Procedure Name: MAC Date/Time: 06/23/2022 1:43 PM  Performed by: Erick Colace, CRNAPre-anesthesia Checklist: Patient identified, Emergency Drugs available, Suction available and Patient being monitored Patient Re-evaluated:Patient Re-evaluated prior to induction Oxygen Delivery Method: Nasal cannula Preoxygenation: Pre-oxygenation with 100% oxygen Induction Type: IV induction Placement Confirmation: positive ETCO2 and breath sounds checked- equal and bilateral Dental Injury: Teeth and Oropharynx as per pre-operative assessment

## 2022-06-23 NOTE — Progress Notes (Signed)
Occupational Therapy Treatment Patient Details Name: [name] MRN: [id number] DOB: 10/29/1950 Today's Date: 06/23/2022   History of present illness 71 yo female admitted with sepsis due to cellulitis PMH ICD, DM2 restrictive lung disease CHF smoker   OT comments  Pt walking about her room with RW and to bathroom modified independently. Demonstrated ability to perform grooming and dressing without assistance. Educated in benefits of using her deceased dad's tub bench as she has had falls stepping over edge of tub. Reinforced energy conservation strategies. Pt will have assist of her children for IADLs.   Recommendations for follow up therapy are one component of a multi-disciplinary discharge planning process, led by the attending physician.  Recommendations may be updated based on patient status, additional functional criteria and insurance authorization.    Follow Up Recommendations  No OT follow up    Assistance Recommended at Discharge PRN  Patient can return home with the following  Assistance with cooking/housework;Assist for transportation;Help with stairs or ramp for entrance   Equipment Recommendations  None recommended by OT    Recommendations for Other Services      Precautions / Restrictions Precautions Precautions: Fall Restrictions Weight Bearing Restrictions: No       Mobility Bed Mobility               General bed mobility comments: in chair    Transfers Overall transfer level: Modified independent Equipment used: Rolling walker (2 wheels)                     Balance Overall balance assessment: Needs assistance   Sitting balance-Leahy Scale: Good Sitting balance - Comments: no LOB bending over to don socks     Standing balance-Leahy Scale: Poor Standing balance comment: reliant on RW can release walker during ADLs without LOB                           ADL either performed or assessed with clinical judgement   ADL  Overall ADL's : Modified independent                                     Functional mobility during ADLs: Modified independent;Rolling walker (2 wheels) General ADL Comments: educated pt to keep using tub transfer bench and avoid stepping over the edge of the tub    Extremity/Trunk Assessment              Vision       Perception     Praxis      Cognition Arousal/Alertness: Awake/alert Behavior During Therapy: WFL for tasks assessed/performed Overall Cognitive Status: Within Functional Limits for tasks assessed                                          Exercises      Shoulder Instructions       General Comments      Pertinent Vitals/ Pain       Pain Assessment Pain Assessment: No/denies pain  Home Living                                          Prior Functioning/Environment  Frequency           Progress Toward Goals  OT Goals(current goals can now be found in the care plan section)  Progress towards OT goals: Progressing toward goals     Plan All goals met and education completed, patient discharged from OT services    Co-evaluation                 AM-PAC OT "6 Clicks" Daily Activity     Outcome Measure   Help from another person eating meals?: None Help from another person taking care of personal grooming?: None Help from another person toileting, which includes using toliet, bedpan, or urinal?: None Help from another person bathing (including washing, rinsing, drying)?: None Help from another person to put on and taking off regular upper body clothing?: None Help from another person to put on and taking off regular lower body clothing?: None 6 Click Score: 24    End of Session Equipment Utilized During Treatment: Rolling walker (2 wheels)  OT Visit Diagnosis: Unsteadiness on feet (R26.81);Muscle weakness (generalized) (M62.81)   Activity Tolerance Patient tolerated  treatment well   Patient Left in chair;with call bell/phone within reach   Nurse Communication          Time: 0921-0940 OT Time Calculation (min): 19 min  Charges: OT General Charges $OT Visit: 1 Visit OT Treatments $Self Care/Home Management : 8-22 mins  Cleta Alberts, OTR/L Acute Rehabilitation Services Office: [phone]  Malka So 06/23/2022, 9:47 AM

## 2022-06-23 NOTE — CV Procedure (Signed)
INDICATIONS: Bacteremia  PROCEDURE:   Informed consent was obtained prior to the procedure. The risks, benefits and alternatives for the procedure were discussed and the patient comprehended these risks.  Risks include, but are not limited to, cough, sore throat, vomiting, nausea, somnolence, esophageal and stomach trauma or perforation, bleeding, low blood pressure, aspiration, pneumonia, infection, trauma to the teeth and death.    After a procedural time-out, the oropharynx was anesthetized with 20% benzocaine spray.   During this procedure the patient was administered propofol per anesthesia.  The patient's heart rate, blood pressure, and oxygen saturation were monitored continuously during the procedure. The period of conscious sedation was 20 minutes, of which I was present face-to-face 100% of this time.  The transesophageal probe was inserted in the esophagus and stomach without difficulty and multiple views were obtained.  The patient was kept under observation until the patient left the procedure room.  The patient left the procedure room in stable condition.   Agitated microbubble saline contrast was not administered.  COMPLICATIONS:    There were no immediate complications.  FINDINGS:   FORMAL ECHOCARDIOGRAM REPORT PENDING No valvular vegetation, no vegetations on device lead LVEF 30% with septal dyskinesis. Trivial MR, AI, PR Moderate TR  No evidence of endocarditis  RECOMMENDATIONS:    Return to hospital room when normotensive and alert  Time Spent Directly with the Patient:  45 minutes   [name] A Lindbergh Winkles 06/23/2022, 2:16 PM

## 2022-06-23 NOTE — TOC Progression Note (Signed)
Transition of Care [facility]) - Progression Note    Patient Details  Name: TEREA NEUBAUER MRN: [id number] Date of Birth: 29-Nov-1950  Transition of Care [facility]) CM/SW [place], RN Phone [phone]  06/23/2022, 9:36 AM  Clinical Narrative:    Home health referral has been accepted by [facility]. AVS updated.   Expected Discharge Plan: [place] Barriers to Discharge: Continued Medical Work up  Expected Discharge Plan and Services Expected Discharge Plan: [place] In-house Referral: Clinical Social Work   Post Acute Care Choice: Home Health, Durable Medical Equipment Living arrangements for the past 2 months: Apartment                 DME Arranged: Shower stool DME Agency: [employer] Date DME Agency Contacted: 06/21/22 Time DME Agency Contacted: [phone] Representative spoke with at DME Agency: [facility]) Interventions    Readmission Risk Interventions    06/22/2022   11:43 AM  Readmission Risk Prevention Plan  Transportation Screening Complete  PCP or Specialist Appt within 5-7 Days Complete  Home Care Screening Complete  Medication Review (RN CM) Referral to Pharmacy

## 2022-06-23 NOTE — Transfer of Care (Signed)
Immediate Anesthesia Transfer of Care Note  Patient: [name]  Procedure(s) Performed: TRANSESOPHAGEAL ECHOCARDIOGRAM (TEE)  Patient Location: Short Stay  Anesthesia Type:MAC  Level of Consciousness: drowsy  Airway & Oxygen Therapy: Patient Spontanous Breathing and Patient connected to face mask oxygen  Post-op Assessment: Report given to RN and Post -op Vital signs reviewed and stable  Post vital signs: Reviewed and stable  Last Vitals:  Vitals Value Taken Time  BP 147/89 06/23/22 1414  Temp    Pulse 78 06/23/22 1418  Resp 29 06/23/22 1418  SpO2 100 % 06/23/22 1418  Vitals shown include unvalidated device data.  Last Pain:  Vitals:   06/23/22 1221  TempSrc: Tympanic  PainSc: 0-No pain         Complications: No notable events documented.

## 2022-06-23 NOTE — Progress Notes (Signed)
Evening CBG not uploading to computer reading is 139

## 2022-06-23 NOTE — Progress Notes (Signed)
Mobility Specialist Progress Note    06/23/22 1026  Mobility  Activity Ambulated with assistance in hallway  Level of Assistance Contact guard assist, steadying assist  Assistive Device Four wheel walker  Distance Ambulated (ft) 200 ft  Activity Response Tolerated well  $Mobility charge 1 Mobility   Pre-Mobility: 71 HR During Mobility: 80 HR Post-Mobility: 76 HR  Pt received in chair and agreeable. No complaints on walk. Returned to chair with call bell in reach.    Hildred Alamin Mobility Specialist

## 2022-06-23 NOTE — TOC Progression Note (Addendum)
Transition of Care [facility]) - Progression Note    Patient Details  Name: [name] MRN: [id number] Date of Birth: 17-Nov-1950  Transition of Care [facility]) CM/SW [place], RN Phone [phone]  06/23/2022, 2:57 PM  Clinical Narrative:  CM at bedside to discuss possibility of home with IV antibiotics and if patient would have support if determined that she needs home IV antibiotics. Patient states that her daughter would be able to assist with IV antibiotics if determined to be needed. CM spoke with daughter Sindy Messing [phone] who confirms that if home IV antibiotics are needed she will be able to assist. TOC continues to follow for disposition needs   Expected Discharge Plan: [place] Barriers to Discharge: Continued Medical Work up  Expected Discharge Plan and Services Expected Discharge Plan: [place] In-house Referral: Clinical Social Work   Post Acute Care Choice: Home Health, Durable Medical Equipment Living arrangements for the past 2 months: Apartment                 DME Arranged: Shower stool DME Agency: [employer] Date DME Agency Contacted: 06/21/22 Time DME Agency Contacted: [phone] Representative spoke with at DME Agency: Maudie Mercury HH Arranged: PT, OT HH Agency: [facility] Date [place]: 06/23/22 Time [place]: [phone] Representative spoke with at [place]: [place] ([place]) Interventions    Readmission Risk Interventions    06/22/2022   11:43 AM  Readmission Risk Prevention Plan  Transportation Screening Complete  PCP or Specialist Appt within 5-7 Days Complete  Home Care Screening Complete  Medication Review (RN CM) Referral to Pharmacy

## 2022-06-23 NOTE — Interval H&P Note (Signed)
History and Physical Interval Note:  06/23/2022 12:23 PM  [name]  has presented today for surgery, with the diagnosis of BACTERIMIA.  The various methods of treatment have been discussed with the patient and family. After consideration of risks, benefits and other options for treatment, the patient has consented to  Procedure(s): TRANSESOPHAGEAL ECHOCARDIOGRAM (TEE) (N/A) as a surgical intervention.  The patient's history has been reviewed, patient examined, no change in status, stable for surgery.  I have reviewed the patient's chart and labs.  Questions were answered to the patient's satisfaction.     [name]

## 2022-06-23 NOTE — Progress Notes (Signed)
Patient with 28 beat run VT.  Pt asleep during event.  Dr. Jodi Mourning notified.

## 2022-06-23 NOTE — Anesthesia Preprocedure Evaluation (Signed)
Anesthesia Evaluation  Patient identified by MRN, date of birth, ID band Patient awake    Reviewed: Allergy & Precautions, NPO status , Patient's Chart, lab work & pertinent test results  History of Anesthesia Complications Negative for: history of anesthetic complications  Airway Mallampati: II  TM Distance: >3 FB Neck ROM: Full    Dental  (+) Edentulous Upper, Edentulous Lower, Dental Advisory Given   Pulmonary shortness of breath and Long-Term Oxygen Therapy, sleep apnea , COPD,  oxygen dependent, Current Smoker and Patient abstained from smoking.,     + decreased breath sounds      Cardiovascular hypertension, Pt. on medications and Pt. on home beta blockers +CHF  + Cardiac Defibrillator  Rhythm:Regular  1. Left ventricular ejection fraction, by estimation, is 20 to 25%. Left  ventricular ejection fraction by PLAX is 23 %. The left ventricle has  severely decreased function. The left ventricle demonstrates global  hypokinesis. The left ventricular internal  cavity size was mildly dilated. Left ventricular diastolic parameters are  consistent with Grade II diastolic dysfunction (pseudonormalization).  Elevated left ventricular end-diastolic pressure.  2. Right ventricular systolic function is normal. The right ventricular  size is normal. There is severely elevated pulmonary artery systolic  pressure.  3. Left atrial size was severely dilated.  4. The mitral valve is degenerative. Trivial mitral valve regurgitation.  No evidence of mitral stenosis.  5. The aortic valve is tricuspid. There is mild calcification of the  aortic valve. There is mild thickening of the aortic valve. Aortic valve  regurgitation is mild. No aortic stenosis is present. Aortic regurgitation  PHT measures 430 msec.  6. Aortic dilatation noted. There is mild dilatation of the ascending  aorta, measuring 37 mm.  7. The inferior vena cava is  dilated in size with <50% respiratory  variability, suggesting right atrial pressure of 15 mmHg.    Neuro/Psych PSYCHIATRIC DISORDERS Depression    GI/Hepatic negative GI ROS, Neg liver ROS,   Endo/Other  diabetes, Insulin Dependent  Renal/GU Renal diseaseLab Results      Component                Value               Date                      CREATININE               1.10 (H)            06/23/2022                Musculoskeletal negative musculoskeletal ROS (+)   Abdominal   Peds  Hematology negative hematology ROS (+) Lab Results      Component                Value               Date                      WBC                      8.1                 06/23/2022                HGB  13.6                06/23/2022                HCT                      40.0                06/23/2022                MCV                      81.8                06/23/2022                PLT                      303                 06/23/2022              Anesthesia Other Findings eval for   Reproductive/Obstetrics                             Anesthesia Physical Anesthesia Plan  ASA: 3  Anesthesia Plan:    Post-op Pain Management: Minimal or no pain anticipated   Induction: Intravenous  PONV Risk Score and Plan: 1 and Propofol infusion and Treatment may vary due to age or medical condition  Airway Management Planned: Nasal Cannula and Natural Airway  Additional Equipment: None  Intra-op Plan:   Post-operative Plan:   Informed Consent: I have reviewed the patients History and Physical, chart, labs and discussed the procedure including the risks, benefits and alternatives for the proposed anesthesia with the patient or authorized representative who has indicated his/her understanding and acceptance.     Dental advisory given  Plan Discussed with: CRNA  Anesthesia Plan Comments:         Anesthesia Quick Evaluation

## 2022-06-24 ENCOUNTER — Other Ambulatory Visit (HOSPITAL_COMMUNITY): Payer: Self-pay

## 2022-06-24 DIAGNOSIS — L0390 Cellulitis, unspecified: Secondary | ICD-10-CM | POA: Diagnosis not present

## 2022-06-24 DIAGNOSIS — B954 Other streptococcus as the cause of diseases classified elsewhere: Secondary | ICD-10-CM | POA: Diagnosis not present

## 2022-06-24 DIAGNOSIS — A419 Sepsis, unspecified organism: Secondary | ICD-10-CM | POA: Diagnosis not present

## 2022-06-24 LAB — GLUCOSE, CAPILLARY
Glucose-Capillary: 139 mg/dL — ABNORMAL HIGH (ref 70–99)
Glucose-Capillary: 196 mg/dL — ABNORMAL HIGH (ref 70–99)
Glucose-Capillary: 227 mg/dL — ABNORMAL HIGH (ref 70–99)

## 2022-06-24 LAB — BASIC METABOLIC PANEL
Anion gap: 6 (ref 5–15)
BUN: 22 mg/dL (ref 8–23)
CO2: 25 mmol/L (ref 22–32)
Calcium: 8.4 mg/dL — ABNORMAL LOW (ref 8.9–10.3)
Chloride: 107 mmol/L (ref 98–111)
Creatinine, Ser: 1.13 mg/dL — ABNORMAL HIGH (ref 0.44–1.00)
GFR, Estimated: 53 mL/min — ABNORMAL LOW (ref >60)
Glucose, Bld: 247 mg/dL — ABNORMAL HIGH (ref 70–99)
Potassium: 4.5 mmol/L (ref 3.5–5.1)
Sodium: 138 mmol/L (ref 135–145)

## 2022-06-24 MED ORDER — INSULIN ASPART 100 UNIT/ML IJ SOLN
0.0000 [IU] | Freq: Three times a day (TID) | INTRAMUSCULAR | Status: DC
  Administered 2022-06-24: 2 [IU] via SUBCUTANEOUS

## 2022-06-24 MED ORDER — INSULIN DEGLUDEC 100 UNIT/ML ~~LOC~~ SOPN
10.0000 [IU] | PEN_INJECTOR | Freq: Every day | SUBCUTANEOUS | 0 refills | Status: DC
  Filled 2022-06-24: qty 3, 30d supply, fill #0

## 2022-06-24 MED ORDER — AMOXICILLIN 500 MG PO CAPS
1000.0000 mg | ORAL_CAPSULE | Freq: Three times a day (TID) | ORAL | 0 refills | Status: DC
  Filled 2022-06-24: qty 42, 7d supply, fill #0

## 2022-06-24 MED ORDER — AMOXICILLIN 500 MG PO CAPS
1000.0000 mg | ORAL_CAPSULE | Freq: Three times a day (TID) | ORAL | Status: DC
  Administered 2022-06-24: 1000 mg via ORAL
  Filled 2022-06-24: qty 2

## 2022-06-24 NOTE — Progress Notes (Signed)
[place] for Infectious Disease    Date of Admission:  06/17/2022   Total days of antibiotics 8   ID: [name] is a 71 y.o. female with  streptococcal bacteremia in the setting of cellulitis of right leg Principal Problem:   Sepsis due to cellulitis [facility]) Active Problems:   ICD (implantable cardioverter-defibrillator) in place   Uncontrolled type 2 diabetes mellitus with hyperglycemia ([place])   Restrictive lung disease   Chronic combined systolic and diastolic congestive heart failure (HCC)   Sepsis (HCC)   Pressure injury of skin    Subjective: Afebrile. Underwent TEE that ruled out endocarditis. Feeling fine this morning no sore throat. Leg swelling improved  Medications:   amoxicillin  1,000 mg Oral Q8H   aspirin EC  81 mg Oral Daily   atorvastatin  80 mg Oral QHS   enoxaparin (LOVENOX) injection  40 mg Subcutaneous Q24H   insulin aspart  0-6 Units Subcutaneous TID WC   metoprolol succinate  50 mg Oral Daily   umeclidinium-vilanterol  1 puff Inhalation Daily    Objective: Vital signs in last 24 hours: Temp:  [96.2 F (35.7 C)-98.6 F (37 C)] 97.3 F (36.3 C) (09/22 0722) Pulse Rate:  [67-80] 75 (09/22 0722) Resp:  [10-27] 17 (09/22 0722) BP: (106-148)/(60-89) 128/68 (09/22 0722) SpO2:  [96 %-99 %] 96 % (09/22 0842) Weight:  [85.7 kg-94 kg] 94 kg (09/22 0620)  Physical Exam  Constitutional:  oriented to person, place, and time. appears well-developed and well-nourished. No distress.  HENT: [place]/AT, PERRLA, no scleral icterus Mouth/Throat: Oropharynx is clear and moist. No oropharyngeal exudate.  Cardiovascular: Normal rate, regular rhythm and normal heart sounds. Exam reveals no gallop and no friction rub.  No murmur heard.  Pulmonary/Chest: Effort normal and breath sounds normal. No respiratory distress.  has no wheezes.  Neck = supple, no nuchal rigidity Abdominal: Soft. Bowel sounds are normal.  exhibits no distension. There is no tenderness.   Lymphadenopathy: no cervical adenopathy. No axillary adenopathy Neurological: alert and oriented to person, place, and time.  Skin: Skin is warm and dry. No rash noted. No erythema.  Psychiatric: a normal mood and affect.  behavior is normal.    Lab Results Recent Labs    06/22/22 0636 06/22/22 1005 06/23/22 0415 06/23/22 1336 06/24/22 0327  WBC 8.6  --  8.1  --   --   HGB 11.6*  --  12.2 13.6  --   HCT 37.1  --  38.1 40.0  --   NA  --    < > 140 141 138  K  --    < > 5.4* 5.2* 4.5  CL  --    < > 106 104 107  CO2  --    < > 26  --  25  BUN  --    < > 20 31* 22  CREATININE  --    < > 1.12* 1.10* 1.13*   < > = values in this interval not displayed.   Liver Panel No results for input(s): "PROT", "ALBUMIN", "AST", "ALT", "ALKPHOS", "BILITOT", "BILIDIR", "IBILI" in the last 72 hours. Sedimentation Rate No results for input(s): "ESRSEDRATE" in the last 72 hours. C-Reactive Protein No results for input(s): "CRP" in the last 72 hours.  Microbiology:  Studies/Results: ECHO TEE  Result Date: 06/24/2022    TRANSESOPHOGEAL ECHO REPORT   Patient Name:   [name] Date of Exam: 06/23/2022 Medical Rec #:  [id number]  Height:       62.0 in Accession #:    [id number]   Weight:       189.0 lb Date of Birth:  05/30/1951   BSA:          1.866 m Patient Age:    18 years     BP:           100/50 mmHg Patient Gender: F            HR:           68 bpm. Exam Location:  Inpatient Procedure: Transesophageal Echo, Color Doppler and Cardiac Doppler Indications:     Bacteremia  History:         Patient has prior history of Echocardiogram examinations, most                  recent 06/16/2021. CHF, Defibrillator; Risk                  Factors:Hypertension, Diabetes and Dyslipidemia.  Sonographer:     Raquel Sarna Senior RDCS Referring Phys:  Sande Rives, E Diagnosing Phys: Cherlynn Kaiser MD PROCEDURE: After discussion of the risks and benefits of a TEE, an informed consent was obtained from the patient. TEE  procedure time was 11 minutes. The transesophogeal probe was passed without difficulty through the esophogus of the patient. Imaged were obtained with the patient in a left lateral decubitus position. Local oropharyngeal anesthetic was provided with Cetacaine. Sedation performed by different physician. The patient was monitored while under deep sedation. Anesthestetic sedation was provided intravenously by Anesthesiology: '131mg'$  of Propofol. Image quality was good. The patient developed hypotension during the procedure, and study was focused to answer the clinical question as a result. IMPRESSIONS  1. Left ventricular ejection fraction, by estimation, is 25 to 30%. The left ventricle has severely decreased function. There is abnormal septal motion with dyskinesis.  2. Right ventricular systolic function is mildly reduced. The right ventricular size is moderately enlarged.  3. No left atrial/left atrial appendage thrombus was detected.  4. Right atrial size was moderately dilated.  5. The mitral valve is grossly normal. Trivial mitral valve regurgitation.  6. Tricuspid valve regurgitation is moderate. Query some degree of septal leaflet impingement as part of mechanism of TR.  7. The aortic valve is tricuspid. There is moderate calcification of the aortic valve. Aortic valve regurgitation is mild. No aortic stenosis is present.  8. There is Moderate (Grade III) atheroma plaque involving the aortic arch. Conclusion(s)/Recommendation(s): No evidence of vegetation/infective endocarditis on this transesophageael echocardiogram. FINDINGS  Left Ventricle: Left ventricular ejection fraction, by estimation, is 25 to 30%. The left ventricle has severely decreased function. The left ventricular internal cavity size was normal in size. Abnormal septal motion with dyskinesis. Right Ventricle: The right ventricular size is moderately enlarged. No increase in right ventricular wall thickness. Right ventricular systolic function is  mildly reduced. Left Atrium: Left atrial size was normal in size. No left atrial/left atrial appendage thrombus was detected. Right Atrium: Right atrial size was moderately dilated. Pericardium: There is no evidence of pericardial effusion. Mitral Valve: The mitral valve is grossly normal. Trivial mitral valve regurgitation. Tricuspid Valve: The tricuspid valve is grossly normal. Tricuspid valve regurgitation is moderate. Aortic Valve: The aortic valve is tricuspid. There is moderate calcification of the aortic valve. Aortic valve regurgitation is mild. No aortic stenosis is present. Pulmonic Valve: The pulmonic valve was normal in structure. Pulmonic valve regurgitation is trivial. No evidence of  pulmonic stenosis. Aorta: The aortic root and ascending aorta are structurally normal, with no evidence of dilitation. There is moderate (Grade III) atheroma plaque involving the aortic arch. IAS/Shunts: The interatrial septum was not assessed.   AORTA Ao Sinus diam: 3.20 cm Ao Asc diam:   3.10 cm TRICUSPID VALVE TR Peak grad:   10.5 mmHg TR Vmax:        162.00 cm/s Cherlynn Kaiser MD Electronically signed by Cherlynn Kaiser MD Signature Date/Time: 06/24/2022/5:12:30 AM    Final      Assessment/Plan: Streptococcal bacteremia = finish out 14 day course of treatment with amox 1gm TID for 7 more days.  Cellulitis = resolved. Likely the primary cause of bacteremia.  [facility] Pager: [phone]  06/24/2022, 10:47 AM

## 2022-06-24 NOTE — Progress Notes (Signed)
Physical Therapy Treatment Patient Details Name: [name] MRN: [id number] DOB: 1951-06-07 Today's Date: 06/24/2022   History of Present Illness 71 yo female admitted with sepsis due to cellulitis PMH ICD, DM2 restrictive lung disease CHF smoker    PT Comments    Pt up in recliner on arrival, pleasant and motivated for mobility. Session focused on continued gait and transfer training for increased activity tolerance and safety with mobility with pt mobilizing with RW with up to min guard for safety. Pt with good recall for hand placement throughout transfers and improved gait speed and mechanics. Pt requiring light cues for RW proximity and upright posture with pt able to correct. Educated pt re; importance of continued mobility, activity recommendations and HEP with pt verbalizing understanding. Anticipate safe discharge with assistance noted below once medically cleared, will continue to follow acutely.    Recommendations for follow up therapy are one component of a multi-disciplinary discharge planning process, led by the attending physician.  Recommendations may be updated based on patient status, additional functional criteria and insurance authorization.  Follow Up Recommendations  Home health PT     Assistance Recommended at Discharge Frequent or constant Supervision/Assistance  Patient can return home with the following A little help with walking and/or transfers;A little help with bathing/dressing/bathroom;Assistance with cooking/housework;Assist for transportation;Help with stairs or ramp for entrance   Equipment Recommendations  None recommended by PT    Recommendations for Other Services       Precautions / Restrictions Precautions Precautions: Fall Restrictions Weight Bearing Restrictions: No     Mobility  Bed Mobility Overal bed mobility: Needs Assistance             General bed mobility comments: pt up in chair pre and post session     Transfers Overall transfer level: Modified independent Equipment used: Rolling walker (2 wheels) Transfers: Sit to/from Stand Sit to Stand: Min guard           General transfer comment: min guard from recliner with good recall of safe hand placement    Ambulation/Gait Ambulation/Gait assistance: Min guard Gait Distance (Feet): 172 Feet Assistive device: Rolling walker (2 wheels) Gait Pattern/deviations: Step-through pattern, Decreased stride length, Wide base of support, Trunk flexed Gait velocity: decr     General Gait Details: steady gait with RW, cues for posture and RW proximity, slighting increased speed this session   Stairs             Wheelchair Mobility    Modified Rankin (Stroke Patients Only)       Balance Overall balance assessment: Needs assistance Sitting-balance support: No upper extremity supported, Feet supported Sitting balance-Leahy Scale: Good Sitting balance - Comments: no LOB bending over to don socks   Standing balance support: Bilateral upper extremity supported Standing balance-Leahy Scale: Poor Standing balance comment: reliant on RW can release walker during ADLs without LOB                            Cognition Arousal/Alertness: Awake/alert Behavior During Therapy: WFL for tasks assessed/performed Overall Cognitive Status: Within Functional Limits for tasks assessed                                          Exercises General Exercises - Lower Extremity Ankle Circles/Pumps: AROM, Right, Left, 10 reps, Seated    General Comments  General comments (skin integrity, edema, etc.): VSS on RA      Pertinent Vitals/Pain Pain Assessment Pain Assessment: No/denies pain Pain Intervention(s): Monitored during session    Home Living                          Prior Function            PT Goals (current goals can now be found in the care plan section) Acute Rehab PT Goals Patient Stated  Goal: be able to return home PT Goal Formulation: With patient Time For Goal Achievement: 07/02/22    Frequency    Min 3X/week      PT Plan      Co-evaluation              AM-PAC PT "6 Clicks" Mobility   Outcome Measure  Help needed turning from your back to your side while in a flat bed without using bedrails?: A Lot Help needed moving from lying on your back to sitting on the side of a flat bed without using bedrails?: A Lot Help needed moving to and from a bed to a chair (including a wheelchair)?: A Little Help needed standing up from a chair using your arms (e.g., wheelchair or bedside chair)?: A Little Help needed to walk in hospital room?: A Little Help needed climbing 3-5 steps with a railing? : A Little 6 Click Score: 16    End of Session   Activity Tolerance: Patient tolerated treatment well Patient left: with call bell/phone within reach;in chair Nurse Communication: Mobility status PT Visit Diagnosis: Other abnormalities of gait and mobility (R26.89);Difficulty in walking, not elsewhere classified (R26.2)     Time: 0109-3235 PT Time Calculation (min) (ACUTE ONLY): 15 min  Charges:  $Gait Training: 8-22 mins                     Rylan Kaufmann R. PTA Acute Rehabilitation Services Office: [place] 06/24/2022, 10:54 AM

## 2022-06-24 NOTE — Progress Notes (Signed)
Discharge instructions given to patient. Student RN answered any questions and patient verbalized understanding. IV removed. TOC medications received and reviewed with patient.

## 2022-06-24 NOTE — Discharge Summary (Addendum)
Name: [name] MRN: [id number] DOB: [birth year] 71 y.o. PCP: [name], MD  Date of Admission: 06/17/2022  1:42 AM Date of Discharge: 06/24/2022 Attending Physician: Axel Filler, *  Discharge Diagnosis: 1. Principal Problem:   Sepsis due to cellulitis [facility]) Active Problems:   ICD (implantable cardioverter-defibrillator) in place   Uncontrolled type 2 diabetes mellitus with hyperglycemia ([place])   Restrictive lung disease   Chronic combined systolic and diastolic congestive heart failure (HCC)   Sepsis (HCC)   Pressure injury of skin AKI Hyperkalemia  Discharge Medications: Allergies as of 06/24/2022       Reactions   Actos [pioglitazone] Other (See Comments)   REACTION: congestive heart failure   Naproxen Other (See Comments)   544m dose made her sleep for two days   Rosiglitazone Hives   Tomato Itching   Hydrocodone-acetaminophen Itching   Hydrocortisone Hives, Itching, Other (See Comments)   unknown        Medication List     STOP taking these medications    sacubitril-valsartan 49-51 MG Commonly known as: ENTRESTO   spironolactone 25 MG tablet Commonly known as: ALDACTONE       TAKE these medications    Accu-Chek FastClix Lancet Kit Lancing device, use with lancets for blood glucose monitoring.   Accu-Chek Guide Me w/Device Kit by Does not apply route.   albuterol 108 (90 Base) MCG/ACT inhaler Commonly known as: VENTOLIN HFA Inhale 2 puffs into the lungs every 6 (six) hours as needed for wheezing or shortness of breath.   amoxicillin 500 MG capsule Commonly known as: AMOXIL Take 2 capsules (1,000 mg total) by mouth every 8 (eight) hours.   Anoro Ellipta 62.5-25 MCG/ACT Aepb Generic drug: umeclidinium-vilanterol Inhale 1 puff into the lungs daily.   aspirin EC 81 MG tablet Take 81 mg by mouth daily. Swallow whole.   atorvastatin 80 MG tablet Commonly known as: LIPITOR Take 1 tablet (80 mg total) by mouth at bedtime.    calcium-vitamin D 500-200 MG-UNIT tablet Commonly known as: OSCAL WITH D Take 1 tablet by mouth 2 (two) times daily.   dapagliflozin propanediol 10 MG Tabs tablet Commonly known as: Farxiga Take 1 tablet (10 mg total) by mouth daily.   Dexcom G7 Receiver Devi Use with  Dexcom G7 sensor to monitor blood sugar continuously   Dexcom G7 Sensor Misc Place new sensor every 10 days and use to monitor blood sugar continuously   dupilumab 200 MG/1.14ML prefilled syringe Commonly known as: DUPIXENT Inject 200 mg into the skin every 14 (fourteen) days. Every other Friday   furosemide 40 MG tablet Commonly known as: LASIX Take 1 tablet (40 mg total) by mouth 2 (two) times daily.   HumaLOG Junior KwikPen 100 UNIT/ML Generic drug: Insulin lispro Inject 6 Units into the skin 3 (three) times daily.   insulin degludec 100 UNIT/ML FlexTouch Pen Commonly known as: TRESIBA Inject 10 Units into the skin daily. What changed: how much to take   Lancets Misc 1 Act by Does not apply route 4 (four) times daily.   metoprolol succinate 50 MG 24 hr tablet Commonly known as: TOPROL-XL TAKE 1 TABLET(50 MG) BY MOUTH DAILY WITH OR IMMEDIATELY FOLLOWING A MEAL What changed:  how much to take how to take this when to take this additional instructions   Pen Needles 32G X 4 MM Misc Use to inject insulin 3 times times per day   True Focus Blood Glucose Strip test strip Generic drug: glucose blood Use  to check blood sugar 3 times a day   Victoza 18 MG/3ML Sopn Generic drug: liraglutide Inject 1.54m daily into the skin               Durable Medical Equipment  (From admission, onward)           Start     Ordered   06/21/22 1557  For home use only DME Shower stool  Once        06/21/22 1557              Discharge Care Instructions  (From admission, onward)           Start     Ordered   06/24/22 0000  Discharge wound care:       Comments: Keep wound clean with soap and  water, then dry and keep dry after, alternate the sides you lay on to prevent worsening of pressure ulcer   06/24/22 1057            Disposition and follow-up:   [name]  At the hospital follow up visit please address:  1.    A. Sepsis secondary to Group G Strep bacteremia   B. RLE Cellulitis  C. Acute kidney injury  D. Type 2 DM  2.  Labs / imaging needed at time of follow-up: BMP, CBC  3.  Pending labs/ test needing follow-up: None  Follow-up Appointments: - F/u with primary care provider [facility] at 10:15 AM.   [facility].   Specialty: Home Health Services Why: [facility] The office will contact you with start of care information. If you have any questions or concerns please call the number listed above. Contact information: [phone]                Hospital Course by problem list: DYeng Frankieis a 71y/o female with past medical history of CHF s/p ICD, HTN, HLD, T2DM, and restrictive lung disease that presented with generalized weakness and was admitted for somnolence and suspected sepsis.    # RLE cellulitis complicated by Group G Strep bacteremia and sepsis Patient presented with 4 days of weakness and fatigue. Febrile and tachycardic on admission. Elevated lactic acid and leukocytosis on admission.  Also found to have right lower extremity with erythema, warmth, edema, and tenderness to palpation. Was started on vancomycin and cefepime in the ED. Blood cultures positive for Strep G. MRSA swab negative. Ultrasound of the right lower extremity negative for DVT.  Suspected symptoms were secondary to sepsis from RLE cellulitis. Has history of ORIF from right ankle fracture in 2020. Initially  planned to order MRI of the right foot however the patient's pacemaker was not compatible with MRI. Patient was initially somnolent and intermittently hypotensive. Blood pressure and mentation improved following 500 mL LR bolus. Vancomycin stopped on 9/16. Finished 3-day course of Cefepime on 9/17. Started Cefazolin on 9/17 after sensitivities. 7 total days of antibiotics to date on day of discharge. Lactic acidosis resolved. Repeat blood cultures negative. TEE was negative for vegetations. Erythema resolved. Still with some soreness of the right ankle but able to get up and walk with mobility specialists. Currently alert and oriented to person, place, time.  Discharged with prescription for amoxicillin for 7 days.   2. # Acute kidney injury Baseline creatinine 0.8-1.  Creatinine elevated at 1.53 on admission. Likely prerenal given that patient appeared dry on exam. Patient was also complaining of red-tinged urine on admission. Urinalysis not concerning for UTI but small amount of Hgb present and no RBCs. CK mildly elevated but trended downward. Encouraged PO intake. Held entresto and spironolactone. Kidney function continued to improve. BP remained low normal throughout her stay. Entresto and spironolactone held at discharge.     3. # Fall Patient states that she slipped out of bed the morning of her admission. Denied head trauma or LOC. Abrasions without active bleeding were noted on her left lower forehead. Given these findings and her somnolence CT head was ordered. CT head negative.    4. # Chronic combined systolic and diastolic heart failure # Non-ischemic cardiomyopathy post implantable cardiac defibrillator # HTN Echo in 06/2021 demonstrated EF 20-25% with severely dilated LA. ICD placed in 02/2011. On furosemide 107m BID, metoprolol succinate 56m sacubitril-valsartan 49-5184mID, and spironolactone 12.5mg88m home. Initially held BP medications due to low-normal BP. Restarted metoprolol.  Discharged on metoprolol and furosemide. Entresto and spironolactone held at discharge.     5. # Restrictive lung disease Restrictive findings on PFTs in 10/2018. On 2L [place] at home. Patient was not complaining of difficulty breathing but course breath sounds bilaterally were noted on admission. CXR demonstrated interstitial prominence in the right upper lung that may be due to asymmetric edema or atypical infection. Leukocytosis resolved. Lung sounds improved with Albuterol 108ug q6 PRN and Umeclidinium-vilanterol 62.5-25mg q24. On RA on day of discharge. Resumed on home medications at discharge.    6. # Type II diabetes A1C 10.7 in 01/2022.  Was taking tresiba 22u daily, lispro 6u 3X daily, farxiga, and victoza at home prior to hospitalization. Had several episodes of low blood sugar. Insulin was held initially. TSH WNL. Cortisol and ACTH stimulation test not concerning for adrenal insufficiency. Has been requiring less insulin in the hospital than prior to admission. Patient also endorses having episodes of low blood sugar at home. Discharged on tresiba 10 units daily, lispro 6 units 3X daily w/ meals, and liraglutide 1.8 mg daily.    7. # Hyperlipidemia On atorvastatin and sent home on this medication.    8. # Tobacco use disorder Smoking history and currently smoking. Encouraged smoking cessation.    9. # Psoriasiform dermatitis  On dupilumab 200mg51m6 at home. Sent home on this medication.   Discharge Exam:   BP 126/72 (BP Location: Right Wrist)   Pulse 76   Temp 97.6 F (36.4 C) (Oral)   Resp 15   Ht 5' 2"  (1.575 m)   Wt 94 kg   SpO2 96%   BMI 37.90 kg/m   General: sitting up in her chair, not in acute distress  Pulmonary: normal respiratory effort, no wheezing, rales, or rhochi  Cardiac: regular rate and rhythm, 1+ edema of bilateral LE Skin: Chronic venous stasis changes of LE, no erythema or warmth of bilateral LE MSK: minimal pain w/ ROM of the right ankle in all directions,  good ROM of the right ankle Neurologic: alert and oriented x3, responding appropriately  Pertinent Labs, Studies, and Procedures:     Latest Ref Rng & Units 06/23/2022    1:36 PM 06/23/2022    4:15 AM 06/22/2022    6:36 AM  CBC  WBC 4.0 - 10.5 K/uL  8.1  8.6  Hemoglobin 12.0 - 15.0 g/dL 13.6  12.2  11.6   Hematocrit 36.0 - 46.0 % 40.0  38.1  37.1   Platelets 150 - 400 K/uL  303  303        Latest Ref Rng & Units 06/24/2022    3:27 AM 06/23/2022    1:36 PM 06/23/2022    4:15 AM  BMP  Glucose 70 - 99 mg/dL 247  123  224   BUN 8 - 23 mg/dL 22  31  20    Creatinine 0.44 - 1.00 mg/dL 1.13  1.10  1.12   Sodium 135 - 145 mmol/L 138  141  140   Potassium 3.5 - 5.1 mmol/L 4.5  5.2  5.4   Chloride 98 - 111 mmol/L 107  104  106   CO2 22 - 32 mmol/L 25   26   Calcium 8.9 - 10.3 mg/dL 8.4   8.7     DG Chest Port 1 View  IMPRESSION: Interstitial prominence in the right upper lung may be due to asymmetric edema or atypical infection.   On: 06/17/2022 03:07  DG Tibia/Fibula Right  IMPRESSION: No acute osseous abnormality.   On: 06/17/2022 03:09  CT Head Wo Contrast  IMPRESSION: No acute intracranial abnormality seen.   On: 06/17/2022 10:57  ECHO TEE  IMPRESSIONS     1. Left ventricular ejection fraction, by estimation, is 25 to 30%. The  left ventricle has severely decreased function. There is abnormal septal  motion with dyskinesis.   2. Right ventricular systolic function is mildly reduced. The right  ventricular size is moderately enlarged.   3. No left atrial/left atrial appendage thrombus was detected.   4. Right atrial size was moderately dilated.   5. The mitral valve is grossly normal. Trivial mitral valve  regurgitation.   6. Tricuspid valve regurgitation is moderate. Query some degree of septal  leaflet impingement as part of mechanism of TR.   7. The aortic valve is tricuspid. There is moderate calcification of the  aortic valve. Aortic valve regurgitation is mild.  No aortic stenosis is  present.   8. There is Moderate (Grade III) atheroma plaque involving the aortic  arch.  On: 06/23/2022  Discharge Instructions: Discharge Instructions     Call MD for:  persistant dizziness or light-headedness   Complete by: As directed    Call MD for:  redness, tenderness, or signs of infection (pain, swelling, redness, odor or green/yellow discharge around incision site)   Complete by: As directed    Call MD for:  severe uncontrolled pain   Complete by: As directed    Call MD for:  temperature >100.4   Complete by: As directed    Diet - low sodium heart healthy   Complete by: As directed    Discharge wound care:   Complete by: As directed    Keep wound clean with soap and water, then dry and keep dry after, alternate the sides you lay on to prevent worsening of pressure ulcer   Face-to-face encounter (required for Medicare/Medicaid patients)   Complete by: As directed    I Yaritzel Stange certify that this patient is under my care and that I, or a nurse practitioner or physician's assistant working with me, had a face-to-face encounter that meets the physician face-to-face encounter requirements with this patient on 06/24/2022. The encounter with the patient was in whole, or in part for the following medical condition(s) which is the primary reason for home health care (List  medical condition): Physical Deconditioning   The encounter with the patient was in whole, or in part, for the following medical condition, which is the primary reason for home health care: Physical Deconditioning   I certify that, based on my findings, the following services are medically necessary home health services: Physical therapy   Reason for Medically Necessary Home Health Services: Therapy- Personnel officer, Public librarian   My clinical findings support the need for the above services: Unsafe ambulation due to balance issues   Further, I certify that my clinical  findings support that this patient is homebound due to: Unsafe ambulation due to balance issues   Home Health   Complete by: As directed    To provide the following care/treatments: PT   Increase activity slowly   Complete by: As directed       You were hospitalized for sepsis likely from cellulitis.   Hospital Course: We treated your infection with antibiotics. You will go home on these antibiotics. You needed less insulin in the hospital than you did prior to being hospitalized. We decreased the amount of insulin that you will take at home. We stopped some of your blood pressure medications as your blood pressure was low during your stay.    Medications:   Please start taking: - Amoxicillin (take 2 capsules every 8 hours for 7 days) for your right ankle cellulitis    Please stop taking: - Sacubitril-valsartan - Spironolactone   Please change how you are taking: - Insulin degludec Tyler Aas): inject 10 units into the skin daily   Please continue taking: - Insulin Lispro (inject 6 units into the skin 3 times daily with meals) - Liraglutide (inject 1.8 mg into skin daily) - Lancets misc - Blood glucose monitoring supplies - Albuterol - Umeclidinium-vilanterol - Aspirin  - Atorvastatin - Calcium-vitamin D - Dapagliflozin - Continuous blood gluc receiver - Dupilumab - Furosemide - Lancets - Metoprolol Succinate - Insulin Pen Needle - Glucose blood test strip   Follow-up: - Please follow up with your primary care provider Dr. Alton Revere at the [facility] on 07/04/2022 at 10:15 AM.  Phone: [phone] Address: [facility], [place], [place], [place] [address]   - Home Health will call you about scheduling times to come to your house and work on improving your strength.  Signed: [name], MD 06/24/2022, 11:25 AM   Pager: [phone]

## 2022-06-24 NOTE — Anesthesia Postprocedure Evaluation (Signed)
Anesthesia Post Note  Patient: [name]  Procedure(s) Performed: TRANSESOPHAGEAL ECHOCARDIOGRAM (TEE)     Patient location during evaluation: Endoscopy Anesthesia Type: MAC Level of consciousness: patient cooperative and awake Pain management: pain level controlled Vital Signs Assessment: post-procedure vital signs reviewed and stable Respiratory status: spontaneous breathing, nonlabored ventilation, respiratory function stable and patient connected to nasal cannula oxygen Cardiovascular status: stable and blood pressure returned to baseline Postop Assessment: no apparent nausea or vomiting Anesthetic complications: no   No notable events documented.  Last Vitals:  Vitals:   06/23/22 2332 06/24/22 0326  BP: 122/60 109/65  Pulse: 76 74  Resp: 10 15  Temp: 36.4 C 36.6 C  SpO2:  98%    Last Pain:  Vitals:   06/24/22 0326  TempSrc: Oral  PainSc: Asleep                 Jazlene Bares

## 2022-06-24 NOTE — TOC Progression Note (Signed)
Transition of Care [facility]) - Progression Note    Patient Details  Name: [name] MRN: [id number] Date of Birth: 1950-10-07  Transition of Care [facility]) CM/SW [place], RN Phone Number:[phone]  06/24/2022, 12:06 PM  Clinical Narrative:    Patient with discharge order. HH and DME setup. Daughter has been updated to make her aware that patient does not need home IV antibiotics. No other needs noted at this time. TOC will sign off.    Expected Discharge Plan: [place] Barriers to Discharge: Continued Medical Work up  Expected Discharge Plan and Services Expected Discharge Plan: [place] In-house Referral: Clinical Social Work   Post Acute Care Choice: Home Health, Durable Medical Equipment Living arrangements for the past 2 months: Apartment Expected Discharge Date: 06/24/22               DME Arranged: Shower stool DME Agency: [employer] Date DME Agency Contacted: 06/21/22 Time DME Agency Contacted: [phone] Representative spoke with at DME Agency: Maudie Mercury HH Arranged: PT, OT [place] Agency: [facility] Date [place]: 06/23/22 Time [place]: [phone] Representative spoke with at [place]: [place] ([place]) Interventions    Readmission Risk Interventions    06/22/2022   11:43 AM  Readmission Risk Prevention Plan  Transportation Screening Complete  PCP or Specialist Appt within 5-7 Days Complete  Home Care Screening Complete  Medication Review (RN CM) Referral to Pharmacy

## 2022-06-25 LAB — CULTURE, BLOOD (ROUTINE X 2)
Culture: NO GROWTH
Culture: NO GROWTH
Special Requests: ADEQUATE
Special Requests: ADEQUATE

## 2022-06-26 ENCOUNTER — Encounter (HOSPITAL_COMMUNITY): Payer: Self-pay | Admitting: *Deleted

## 2022-06-26 ENCOUNTER — Other Ambulatory Visit: Payer: Self-pay

## 2022-06-26 ENCOUNTER — Observation Stay (HOSPITAL_COMMUNITY)
Admission: EM | Admit: 2022-06-26 | Discharge: 2022-06-29 | Disposition: A | Discharge status: Home Health | Payer: Medicare Other | Attending: Internal Medicine | Admitting: Internal Medicine

## 2022-06-26 ENCOUNTER — Emergency Department (HOSPITAL_COMMUNITY): Payer: Medicare Other

## 2022-06-26 DIAGNOSIS — Z79899 Other long term (current) drug therapy: Secondary | ICD-10-CM | POA: Diagnosis not present

## 2022-06-26 DIAGNOSIS — R680 Hypothermia, not associated with low environmental temperature: Secondary | ICD-10-CM | POA: Diagnosis not present

## 2022-06-26 DIAGNOSIS — Z95810 Presence of automatic (implantable) cardiac defibrillator: Secondary | ICD-10-CM | POA: Diagnosis not present

## 2022-06-26 DIAGNOSIS — Y9301 Activity, walking, marching and hiking: Secondary | ICD-10-CM | POA: Insufficient documentation

## 2022-06-26 DIAGNOSIS — R9431 Abnormal electrocardiogram [ECG] [EKG]: Secondary | ICD-10-CM | POA: Diagnosis not present

## 2022-06-26 DIAGNOSIS — Z7984 Long term (current) use of oral hypoglycemic drugs: Secondary | ICD-10-CM | POA: Diagnosis not present

## 2022-06-26 DIAGNOSIS — I110 Hypertensive heart disease with heart failure: Secondary | ICD-10-CM | POA: Diagnosis not present

## 2022-06-26 DIAGNOSIS — R7401 Elevation of levels of liver transaminase levels: Secondary | ICD-10-CM | POA: Diagnosis not present

## 2022-06-26 DIAGNOSIS — G4730 Sleep apnea, unspecified: Secondary | ICD-10-CM | POA: Insufficient documentation

## 2022-06-26 DIAGNOSIS — Z7982 Long term (current) use of aspirin: Secondary | ICD-10-CM | POA: Diagnosis not present

## 2022-06-26 DIAGNOSIS — E08649 Diabetes mellitus due to underlying condition with hypoglycemia without coma: Secondary | ICD-10-CM | POA: Diagnosis present

## 2022-06-26 DIAGNOSIS — Z794 Long term (current) use of insulin: Secondary | ICD-10-CM | POA: Diagnosis not present

## 2022-06-26 DIAGNOSIS — E162 Hypoglycemia, unspecified: Secondary | ICD-10-CM | POA: Diagnosis present

## 2022-06-26 DIAGNOSIS — S0083XA Contusion of other part of head, initial encounter: Secondary | ICD-10-CM | POA: Insufficient documentation

## 2022-06-26 DIAGNOSIS — E11649 Type 2 diabetes mellitus with hypoglycemia without coma: Secondary | ICD-10-CM | POA: Diagnosis not present

## 2022-06-26 DIAGNOSIS — I5042 Chronic combined systolic (congestive) and diastolic (congestive) heart failure: Secondary | ICD-10-CM | POA: Insufficient documentation

## 2022-06-26 DIAGNOSIS — J449 Chronic obstructive pulmonary disease, unspecified: Secondary | ICD-10-CM | POA: Diagnosis not present

## 2022-06-26 DIAGNOSIS — F17210 Nicotine dependence, cigarettes, uncomplicated: Secondary | ICD-10-CM | POA: Insufficient documentation

## 2022-06-26 DIAGNOSIS — Z7985 Long-term (current) use of injectable non-insulin antidiabetic drugs: Secondary | ICD-10-CM | POA: Diagnosis not present

## 2022-06-26 DIAGNOSIS — T68XXXA Hypothermia, initial encounter: Secondary | ICD-10-CM | POA: Diagnosis present

## 2022-06-26 DIAGNOSIS — W010XXA Fall on same level from slipping, tripping and stumbling without subsequent striking against object, initial encounter: Secondary | ICD-10-CM | POA: Diagnosis not present

## 2022-06-26 DIAGNOSIS — R4182 Altered mental status, unspecified: Secondary | ICD-10-CM | POA: Insufficient documentation

## 2022-06-26 LAB — RAPID URINE DRUG SCREEN, HOSP PERFORMED
Amphetamines: NOT DETECTED
Barbiturates: NOT DETECTED
Benzodiazepines: NOT DETECTED
Cocaine: NOT DETECTED
Opiates: NOT DETECTED
Tetrahydrocannabinol: NOT DETECTED

## 2022-06-26 LAB — COMPREHENSIVE METABOLIC PANEL
ALT: 15 U/L (ref 0–44)
AST: 66 U/L — ABNORMAL HIGH (ref 15–41)
Albumin: 2.4 g/dL — ABNORMAL LOW (ref 3.5–5.0)
Alkaline Phosphatase: 214 U/L — ABNORMAL HIGH (ref 38–126)
Anion gap: 7 (ref 5–15)
BUN: 21 mg/dL (ref 8–23)
CO2: 26 mmol/L (ref 22–32)
Calcium: 8.8 mg/dL — ABNORMAL LOW (ref 8.9–10.3)
Chloride: 107 mmol/L (ref 98–111)
Creatinine, Ser: 1.16 mg/dL — ABNORMAL HIGH (ref 0.44–1.00)
GFR, Estimated: 51 mL/min — ABNORMAL LOW (ref >60)
Glucose, Bld: 93 mg/dL (ref 70–99)
Potassium: 4.4 mmol/L (ref 3.5–5.1)
Sodium: 140 mmol/L (ref 135–145)
Total Bilirubin: 0.9 mg/dL (ref 0.3–1.2)
Total Protein: 7.1 g/dL (ref 6.5–8.1)

## 2022-06-26 LAB — I-STAT CHEM 8, ED
BUN: 29 mg/dL — ABNORMAL HIGH (ref 8–23)
Calcium, Ion: 1.17 mmol/L (ref 1.15–1.40)
Chloride: 106 mmol/L (ref 98–111)
Creatinine, Ser: 1.2 mg/dL — ABNORMAL HIGH (ref 0.44–1.00)
Glucose, Bld: 83 mg/dL (ref 70–99)
HCT: 38 % (ref 36.0–46.0)
Hemoglobin: 12.9 g/dL (ref 12.0–15.0)
Potassium: 4.6 mmol/L (ref 3.5–5.1)
Sodium: 141 mmol/L (ref 135–145)
TCO2: 31 mmol/L (ref 22–32)

## 2022-06-26 LAB — URINALYSIS, ROUTINE W REFLEX MICROSCOPIC
Bilirubin Urine: NEGATIVE
Glucose, UA: 100 mg/dL — AB
Hgb urine dipstick: NEGATIVE
Ketones, ur: NEGATIVE mg/dL
Leukocytes,Ua: NEGATIVE
Nitrite: NEGATIVE
Protein, ur: NEGATIVE mg/dL
Specific Gravity, Urine: 1.02 (ref 1.005–1.030)
pH: 5.5 (ref 5.0–8.0)

## 2022-06-26 LAB — MAGNESIUM: Magnesium: 1.7 mg/dL (ref 1.7–2.4)

## 2022-06-26 LAB — CBC WITH DIFFERENTIAL/PLATELET
Abs Immature Granulocytes: 0.14 10*3/uL — ABNORMAL HIGH (ref 0.00–0.07)
Basophils Absolute: 0.1 10*3/uL (ref 0.0–0.1)
Basophils Relative: 1 %
Eosinophils Absolute: 0.2 10*3/uL (ref 0.0–0.5)
Eosinophils Relative: 3 %
HCT: 40.1 % (ref 36.0–46.0)
Hemoglobin: 11.8 g/dL — ABNORMAL LOW (ref 12.0–15.0)
Immature Granulocytes: 2 %
Lymphocytes Relative: 20 %
Lymphs Abs: 1.7 10*3/uL (ref 0.7–4.0)
MCH: 25.5 pg — ABNORMAL LOW (ref 26.0–34.0)
MCHC: 29.4 g/dL — ABNORMAL LOW (ref 30.0–36.0)
MCV: 86.6 fL (ref 80.0–100.0)
Monocytes Absolute: 0.5 10*3/uL (ref 0.1–1.0)
Monocytes Relative: 6 %
Neutro Abs: 5.6 10*3/uL (ref 1.7–7.7)
Neutrophils Relative %: 68 %
Platelets: 421 10*3/uL — ABNORMAL HIGH (ref 150–400)
RBC: 4.63 MIL/uL (ref 3.87–5.11)
RDW: 16.5 % — ABNORMAL HIGH (ref 11.5–15.5)
Smear Review: NORMAL
WBC: 8.2 10*3/uL (ref 4.0–10.5)
nRBC: 0 % (ref 0.0–0.2)

## 2022-06-26 LAB — CBG MONITORING, ED: Glucose-Capillary: 75 mg/dL (ref 70–99)

## 2022-06-26 LAB — LACTIC ACID, PLASMA
Lactic Acid, Venous: 1.6 mmol/L (ref 0.5–1.9)
Lactic Acid, Venous: 1.2 mmol/L (ref 0.5–1.9)

## 2022-06-26 LAB — GAMMA GT: GGT: 48 U/L (ref 7–50)

## 2022-06-26 MED ORDER — RIVAROXABAN 10 MG PO TABS
10.0000 mg | ORAL_TABLET | Freq: Every day | ORAL | Status: DC
  Administered 2022-06-26 – 2022-06-28 (×3): 10 mg via ORAL
  Filled 2022-06-26 (×3): qty 1

## 2022-06-26 MED ORDER — AMOXICILLIN 500 MG PO CAPS
1000.0000 mg | ORAL_CAPSULE | Freq: Three times a day (TID) | ORAL | Status: DC
  Administered 2022-06-27 – 2022-06-29 (×8): 1000 mg via ORAL
  Filled 2022-06-26 (×10): qty 2

## 2022-06-26 MED ORDER — DEXTROSE 5 % IV SOLN: INTRAVENOUS | Status: DC

## 2022-06-26 MED ORDER — UMECLIDINIUM-VILANTEROL 62.5-25 MCG/ACT IN AEPB
1.0000 | INHALATION_SPRAY | Freq: Every day | RESPIRATORY_TRACT | Status: DC
  Administered 2022-06-28 – 2022-06-29 (×2): 1 via RESPIRATORY_TRACT
  Filled 2022-06-26: qty 14

## 2022-06-26 MED ORDER — INSULIN ASPART 100 UNIT/ML IJ SOLN
0.0000 [IU] | Freq: Three times a day (TID) | INTRAMUSCULAR | Status: DC
  Administered 2022-06-27: 3 [IU] via SUBCUTANEOUS
  Administered 2022-06-28 – 2022-06-29 (×2): 2 [IU] via SUBCUTANEOUS

## 2022-06-26 MED ORDER — WHITE PETROLATUM EX OINT: TOPICAL_OINTMENT | CUTANEOUS | Status: DC | PRN

## 2022-06-26 NOTE — ED Notes (Signed)
Patient placed on bair hugger at this time

## 2022-06-26 NOTE — Hospital Course (Addendum)
Ms. Elton Catalano is a 71yo F with  PMH of uncontrolled T2DM, COPD, restrictive lung disease, chronic combined systolic and diastolic congestive heart failure s/p ICD, recent admission for sepsis in the setting of RLE Group G strep cellulitis on antibiotics presenting to [facility] via EMS for after a fall  Patient reports she was walking with her Rolator into the living room, when she hit her walker into the TV stand, she lost her balance, and could not get up. She denied lightheadedness, dizziness, palpitations, weakness in her lower extremity, or tripping on surfaces. She denies hitting her head, and that the walker broke the fall. She lives on a carpeted space.   Shes been falling a lot recently.  She is unsure why she is falling. Sometimes this has happened when she is just drinking water, slipping out of bed, or getting out the tub. No tunnel vision, lightheadedness, or prodromal episodes.  Patient was recently discharged from IMTS service where she found to have sepsis 2/2 to RLE cellulitis. Blood cultures at the time grew group G strep. She had been doing well since and taking antibiotics and her usual medications as scheduled. She often gets help from a friend and there is a home nurse who checks on medications as well. She reports her   Patient endorses chills and feeling cold since last night. No fevers, cough, shortness of breath, HA, changes in mental status, polyuria, dysuria, abdominal pain, diarrhea or constipation. She had been able to tolerate PO intake without nausea or vomiting.   Of note, patient often skips meals, but she reports she does not skip Novolog doses as this is 3/day. She reports that her home glucose readings in the AM have continued to be low even after hospital discharge but cannot remember the exact readings.   Collateral information from daughter: 6 falls in the last month. Patient is in charge of her medications but her friend helps her. Update from EMS - patient was found  face down with oxygen cord around her ankles.    ED course: Patient was found to be hypothermic and hypoglycemic. BG was 38. Patient was found given warm dextrose   Blood culture drawn hgb 11.8 Platelets 421 Creatinine 1.16 baseline 1.0-1.1  Gamma gap 4.7 AST 66 Alk phos 214 LA 1.6 abnormoal 9/16  Ct head left preseptal soft tissue swelling Mild mucosal thickening of the ethmoid and maxillary sinuses  Hypothermoa Passive external rewarming.  te of rewarming varies between 0.5 and 2C/hour     10 units lispro 6 units tresiba She does long acting in the morning. victoza   HFrEF Furosemide 40 mg BID Metoprolol 47m qd  Social Friend staying with her to help with medicaiton

## 2022-06-26 NOTE — H&P (Addendum)
Date: 06/26/2022               Patient Name:  [name] MRN: [id number]  DOB: June 08, 1951 Age / Sex: 71 y.o., female   PCP: [name], MD         Medical Service: Internal Medicine Teaching Service         Attending Physician: Dr. Velna Ochs, MD    First Contact: Dr. Linward Natal Pager: [phone]  Second Contact: Dr. Elmer Picker  Pager: [phone]       After Hours (After 5p/  First Contact Pager: [phone]  weekends / holidays): Second Contact Pager: [phone]   Chief Complaint: fall  History of Present Illness:   [name] is a 71yo F with  PMH of uncontrolled T2DM, COPD, restrictive lung disease using 2L [place] qhs, chronic combined systolic and diastolic congestive heart failure s/p ICD, recent admission for sepsis in the setting of RLE Group G strep cellulitis on antibiotics presenting [facility] via EMS after a fall.  Patient reports she was walking with her Rolator into the living room, when she hit her walker into the TV stand, she lost her balance, and could not get up. She denied lightheadedness, dizziness, palpitations, weakness in her lower extremity, or tripping on surfaces. She denies hitting her head, and that the walker broke the fall. She lives on a carpeted space.   Shes been falling a lot recently.  She is unsure why she is falling. Sometimes this has happened when she is just drinking water, slipping out of bed, or getting out the tub. No tunnel vision, lightheadedness, or prodromal episodes.  Patient was recently discharged from IMTS service where she found to have sepsis 2/2 to RLE cellulitis. Blood cultures at the time grew group G strep. She had been doing well since and taking antibiotics and her usual medications as scheduled. She often gets help from a friend and there is a home nurse who checks on medications as well.   Patient endorses chills and feeling cold since last night. No fevers, cough, shortness of breath, HA, changes in mental  status, polyuria, dysuria, abdominal pain, diarrhea or constipation. She had been able to tolerate PO intake without nausea or vomiting.   Of note, patient often skips meals, but she reports she does not skip Novolog doses as this is 3/day. She reports that her home glucose readings in the AM have continued to be low even after hospital discharge but cannot remember the exact readings.   Collateral information from daughter: 6 falls in the last month. Patient is in charge of her medications but her friend helps her. Update from EMS - patient was found face down with oxygen cord around her ankles.    ED course: Patient was found to be hypothermic and hypoglycemic. BG was 38. Patient was found given warm dextrose  Meds:  Amoxicillin 1000 mg TID ASA 81 mg Calcium- vitamin D Dapagliflozin 10 mg Dupilumab 200 mg Furosemide 40 mg BID Degludec 10 units qd Lispro 6 units TID Liraglutide 1.102m qd Metoprolol succinate 50 mg Umeclidinium-vilanterol 62.5-25 mcg  Allergies: Allergies as of 06/26/2022 - Review Complete 06/23/2022  Allergen Reaction Noted   Actos [pioglitazone] Other (See Comments) 03/12/2009   Naproxen Other (See Comments) 02/05/2016   Rosiglitazone Hives 02/11/2016   Tomato Itching 06/16/2021   Hydrocodone-acetaminophen Itching 08/08/2019   Hydrocortisone Hives, Itching, and Other (See Comments) 10/06/2009   Past Medical History:  Diagnosis Date   ATN (acute  tubular necrosis) ([place]) 07/15/2014   Automatic implantable cardioverter-defibrillator in situ    Automatic implantable cardioverter-defibrillator in situ 05/04/2007   Qualifier: Diagnosis of  By: Isla Pence     Cardiac defibrillator in situ    Atlas II VR (SJM) implanted by Dr Lovena Le   CHF (congestive heart failure) ([place])    Chronic combined systolic and diastolic heart failure ([place])    a. EF 35-40% in past;  b. Echo 7/13:  EF 45-50%, Gr 2 diast dysfn, mild AI, mild MAC, trivial MR, mild LAE, PASP 47.   Chronic  ulcer of leg ([place])    04-09-15 resolved-not a problem.   Colon polyps 04/12/2013   Rectosigmoid polyp   COPD (chronic obstructive pulmonary disease) (H[facility])    O2 at night   Depression    Diabetes mellitus    Diabetes mellitus, type 2 ([place]) 04/03/2012   HIGH RISK FEET.. Please have patient take shoes and socks off every visit for visual foot inspection.     Eczema    Elevated alkaline phosphatase level    GGT and 5'nucleotidase 8/13 normal   Health care maintenance 01/22/2013   Surgically absent cervix- no pap needed (Path report 07/2000: uterine body with attached bilateral  adnexa and separate cervix.)   History of oxygen administration    oxygen @ 2 l/m nasally bedtime 24/7   HTN (hypertension)    Hx of cardiac cath    a. [place] 2003 normal;  b. LHC 6/13:  Mild calcification in the LM, o/w normal coronary arteries, EF 45%.    Hyperkalemia 08/08/2017   Hyperlipidemia    Elevated triglyceride in 2019   Hyperlipidemia associated with type 2 diabetes mellitus ([place]) 05/25/2007   Qualifier: Diagnosis of  By: Hassell Done FNP, [place]     Hyperthyroidism, subclinical    HYPERTHYROIDISM, SUBCLINICAL 05/06/2009   Qualifier: Diagnosis of  By: Darrick Meigs     Implantable cardioverter-defibrillator (ICD) generator end of life    Lipoma    Need for hepatitis C screening test 04/25/2019   NICM (nonischemic cardiomyopathy) ([place])    Obesity    On home oxygen therapy    "2L; 24/7" (10/11/2016)   Post menopausal syndrome    Shortness of breath 07/14/2017   Skin rash 07/12/2018   Sleep apnea    pt denies 04/12/2013    Family History:  Family History  Problem Relation Age of Onset   Stroke Mother    Seizures Father    Heart disease Father    Diabetes Sister    Asthma Maternal Aunt        aunts   Asthma Maternal Uncle        uncles   Heart disease Paternal Aunt        aunts   Heart disease Paternal Uncle        uncles   Heart disease Maternal Aunt        aunts   Heart disease Maternal Uncle         uncles   Heart disease Maternal Grandfather    Breast cancer Daughter    Breast cancer Cousin    Asthma Grandchild    Colon cancer Neg Hx    Colon polyps Neg Hx    Esophageal cancer Neg Hx    Kidney disease Neg Hx    Gallbladder disease Neg Hx    Social History:  She lives alone and has a friend that stays with her at times. She is independent in ADLs and iADLs.  She has smoked for many years and currently smokes about 4-5 cigarettes daily. Denies alcohol use. She previously worked as a [employer] manager.  Review of Systems: A complete ROS was negative except as per HPI.   Physical Exam: Blood pressure (!) 141/80, pulse 72, temperature (!) 95.2 F (35.1 C), resp. rate 18, height _0  (1.575 m), weight 94 kg, SpO2 100 %. Constitutional: appears older than stated age, in no acute distress HENT: plaques to scalp, ecchymosis of left eyelid, swelling of face, skin appears taunt, periorbital swelling, macroglossia Cardiovascular: regular rate and rhythm, heart sounds are distant Pulmonary/Chest: normal work of breathing on 1L [place], mild expiratory wheezing Abdominal: soft, non-tender, non-distended MSK: trace edema to lower extremities bilaterally, no warmth or erythema to lower extremity, unable to palpate dorsalis pedis pulses bilaterally Neurological: alert & oriented x 3 Skin: warm and dry  Alk phosphatase 214 AST 66 Gamma gap 4.7 Lactic acidosis 1.6  CBC    Component Value Date/Time   WBC 8.2 06/26/2022 1445   RBC 4.63 06/26/2022 1445   HGB 12.9 06/26/2022 1607   HGB 14.1 02/08/2017 1551   HCT 38.0 06/26/2022 1607   HCT 44.5 02/08/2017 1551   PLT 421 (H) 06/26/2022 1445   PLT 246 02/08/2017 1551   MCV 86.6 06/26/2022 1445   MCV 87 02/08/2017 1551   MCH 25.5 (L) 06/26/2022 1445   MCHC 29.4 (L) 06/26/2022 1445   RDW 16.5 (H) 06/26/2022 1445   RDW 16.7 (H) 02/08/2017 1551   LYMPHSABS 1.7 06/26/2022 1445   LYMPHSABS 2.6 02/08/2017 1551   MONOABS 0.5 06/26/2022 1445   EOSABS 0.2  06/26/2022 1445   EOSABS 0.3 02/08/2017 1551   BASOSABS 0.1 06/26/2022 1445   BASOSABS 0.0 02/08/2017 1551     EKG: personally reviewed my interpretation is sinus rhythm, right axis deviation, prolonged qtc   CT head: left preseptal soft tissue swelling, mild mucosal thickening of the ethmoid and maxillary sinuses.  Assessment & Plan by Problem: Principal Problem:   Diabetes mellitus due to underlying condition with hypoglycemia [facility])  Ms. Hirschmann is a 71 year old female with past medical history of type 2 diabetes on insulin, HFrEF, restrictive lung disease using 1L supplemental O2 at night, and psoriasis who presented with hypoglycemia, hypothermia and a fall and was admitted for hypothermia.  Hypothermia Prolonged Qtc She was found to be hypothermic at 90.61F this morning. She received warmed IV dextrose and bear hugger was put in place. Differentials include hypothermia 2/2 to hypoglycemia versus central hypothyroidism vs erythroderma.  -T4, TSH -repeat EKG in AM, qtc of 673 -telemetry -remove foley temp when able  Hypoglycemia She was worked up for adrenal insufficiency due to hypotension at recent admission with ACTH stim test wnl. TSH was 2.14. She required less insulin during recent admission and insulin regimen was decreased at discharge. She currently takes short and long acting insulin and had not taken any insulin yet when she fell this morning. Her fasting glucose was in 190s the day prior.  Primary concern for hypoglycemia is that she is taking short acting insulin despite not eating.  Another cause of hypoglycemia could be pancreatic insufficiency, although less likely. She has had diabetes for a long duration. She denies diarrhea or abdominal discomfort. -SSI, plan to monitor trend of blood sugars closely.  -CBG 4x daily, qhs -consider fecal elastase  Recurrent falls Preseptal soft tissue swelling of left eye Her daughter also stated that EMS found her mom with oxygen cord  wrapped around  her ankles. This is her 6th fall in the last month. Differentials include hypoglycemia, peripheral neuropathy, claudication, and mechanical fall.  -PT/ OT eval and treat -check orthostatics -ABIs  Elevated alkaline phosphatase Transaminitis Alkaline phosphatase 214, AST 66, she denies abdominal pain. -GGT  Gamma gap Protein at 7.1 with albumin of 2.4 with gamma gap of 4.7.  -UPEP, SPEP  Normocytic anemia Hgb 11.8 with MCV of 86.6. Baseline 10.7-13.6.  -Iron and TIBC -ferritin  Diabetes Last A1c at 9.7 earlier this month. She uses a dexcom at home. Medications include dapagliflozin 10 mg, lispro 6 units TID, degludec 10 units qd and victoza. In setting of recent recurrent episodes of hypoglycemia, home dose of insulin being held. -SSI -holding tresiba  -holding lispro 10 units TID AC  History of RLE cellulitis c/p Group G bacteremia She was admitted 9/15-9/22 for this. Blood cultures positive for strep G. She received 7 days of IV antibiotics.  -continue Amoxicillin TID, last doses 9/29  HFrEF ICD in place Home medications include furosemide 40 mg BID and metoprolol succinate 50 mg. Weight at discharge was 85.7 kg. Weight is now 94 kg. Entresto and spironolactone were held at discharge due to low blood pressure. Her blood pressure has been mildly hypertensive to normotensive with HR in 60-70s. Physical exam with edema to face and upper extremities. No crackles appreciated on lung exam. No lower extremity swelling. She does not appears to be mildly hypervolemic to euvolemic. -BNP -restart metoprolol succinate once normothermic -continue furosemide   Restrictive lung disease Chronic hypoxic respiratory failure, uses 1L oxygen at night Tobacco use disorder Home medications include anoro ellipta. She smoke 4-5 cigarettes daily. She had PFTs on 10/26/2018 that demonstrated a moderate to severe diffusion defect, moderate restrictive disease concerning for a parenchymal  process, and moderate obstructive airway disease (FEV1 65% of predicted / FVC 63 / ratio 79 / DLCO 55). She had a follow up high resolution CT chest on 11/14/2018 that showed mosaic attenuation throughout both lungs with prominent air trapping indicative of small airway disease with superimposed pulmonary edema. She has had multiple pulmonology referrals but has not been seen. -2L [place] qhs -O2 sat goal 88-92%  HLD -continue home medication of atorvastatin 80 mg  Psoriasform dermatitis She follows with [facility] for intrinsic atopic dermatitis. -vaseline -dupilumab at home  Thyroid nodule CT cervical 9/12 with 15 mm lesion left lateral thyroid and 10 mm lesion right thyroid with recommendations for thyroid US. -needs thyroid US as outpatient  Dispo: Admit patient to Observation with expected length of stay less than 2 midnights.  Signed: Christiana Fuchs, DO 06/26/2022, 10:25 PM  Pager: [phone] After 5pm on weekdays and 1pm on weekends: On Call pager: [phone]

## 2022-06-26 NOTE — ED Provider Notes (Signed)
Received signout from previous provider, please see his note for complete H&P.  This is a 71 year old female brought here via EMS for evaluation of a fall and low blood sugar.  Family member car out because patient fell forward striking her face against the ground.  EMS noted that patient was lethargic with initial CBG of 38 improved with medication. Patient was noted to be cool to the touch and has a rectal temperature of 90.4.  Patient was placed on a Bair hugger along with warm IV fluid.  EMR reviewed and patient was previously discharged on hospital 2 days ago for sepsis secondary to cellulitis of her right lower extremity with bacteremia showing group G strep.  9:16 PM Labs, EKG, and imaging obtained independently viewed interpreted by me and I agree with radiologist interpretation.  She has normal lactic acid, urinalysis without signs of urine tract infection, UDS negative for any kind of detectable drugs, electrolyte panels are reassuring, mild AKI with creatinine of 1.2 which she is currently receiving IV fluid.  Blood culture is currently pending.  Normal white count, hemoglobin is 11.8.  Repeat CBG is 75 improved from prior.  Head CT scan shows left receptive soft tissue swelling but no intra cranial abnormalities and no broken bone.  EKG shows new prolonged QT with QTc in the 600s.  We will check magnesium level.  Patient is currently on a Bair hugger and warmed IV fluid with improvement of her core temperature.  Appreciate consultation from internal medicine resident who agrees to see and will admit patient for further care.  This patient presents to the ED for concern of AMS, this involves an extensive number of treatment options, and is a complaint that carries with it a high risk of complications and morbidity.  The differential diagnosis includes delirium, sepsis, hypothermia, hypoglycemia, electrolytes derangement, pneumonia, UTI, cellulitis, bacteremia, stroke  Co morbidities that  complicate the patient evaluation cellulitis  Diabetes  ICD  depression Additional history obtained:  Additional history obtained from EMS External records from outside source obtained and reviewed including EMR including labs and imaging  Lab Tests:  I Ordered, and personally interpreted labs.  The pertinent results include:  as above  Imaging Studies ordered:  I ordered imaging studies including head/maxillofacial I independently visualized and interpreted imaging which showed L periorbital soft tissue swelling I agree with the radiologist interpretation  Cardiac Monitoring:  The patient was maintained on a cardiac monitor.  I personally viewed and interpreted the cardiac monitored which showed an underlying rhythm of: NSR  Medicines ordered and prescription drug management:  I ordered medication including warm IVF  for hypothermia Reevaluation of the patient after these medicines showed that the patient improved I have reviewed the patients home medicines and have made adjustments as needed  Test Considered: as above  Critical Interventions: Bair Hugger  Warm IVF  Consultations Obtained:  I requested consultation with the internal medicine,  and discussed lab and imaging findings as well as pertinent plan - they recommend: admission  Problem List / ED Course: altered mental status  Hypoglycemia  Facial contusion  hypothermia  Reevaluation:  After the interventions noted above, I reevaluated the patient and found that they have :improved  Social Determinants of Health: none  Dispostion:  After consideration of the diagnostic results and the patients response to treatment, I feel that the patent would benefit from admission.  .Critical Care  Performed by: Domenic Moras, PA-C Authorized by: Domenic Moras, PA-C   Critical care provider  statement:    Critical care time (minutes):  45   Critical care was time spent personally by me on the following activities:   Development of treatment plan with patient or surrogate, discussions with consultants, evaluation of patient's response to treatment, examination of patient, ordering and review of laboratory studies, ordering and review of radiographic studies, ordering and performing treatments and interventions, pulse oximetry, re-evaluation of patient's condition and review of old charts     BP 128/69 (BP Location: Right Arm)   Pulse 70   Temp (!) 94.6 F (34.8 C)   Resp 15   Ht '5\' 2"'$  (1.575 m)   Wt 94 kg   SpO2 100%   BMI 37.90 kg/m   Results for orders placed or performed during the hospital encounter of 06/26/22  CBC with Differential  Result Value Ref Range   WBC 8.2 4.0 - 10.5 K/uL   RBC 4.63 3.87 - 5.11 MIL/uL   Hemoglobin 11.8 (L) 12.0 - 15.0 g/dL   HCT 40.1 36.0 - 46.0 %   MCV 86.6 80.0 - 100.0 fL   MCH 25.5 (L) 26.0 - 34.0 pg   MCHC 29.4 (L) 30.0 - 36.0 g/dL   RDW 16.5 (H) 11.5 - 15.5 %   Platelets 421 (H) 150 - 400 K/uL   nRBC 0.0 0.0 - 0.2 %   Neutrophils Relative % 68 %   Neutro Abs 5.6 1.7 - 7.7 K/uL   Lymphocytes Relative 20 %   Lymphs Abs 1.7 0.7 - 4.0 K/uL   Monocytes Relative 6 %   Monocytes Absolute 0.5 0.1 - 1.0 K/uL   Eosinophils Relative 3 %   Eosinophils Absolute 0.2 0.0 - 0.5 K/uL   Basophils Relative 1 %   Basophils Absolute 0.1 0.0 - 0.1 K/uL   WBC Morphology MORPHOLOGY UNREMARKABLE    RBC Morphology MORPHOLOGY UNREMARKABLE    Smear Review Normal platelet morphology    Immature Granulocytes 2 %   Abs Immature Granulocytes 0.14 (H) 0.00 - 0.07 K/uL  Comprehensive metabolic panel  Result Value Ref Range   Sodium 140 135 - 145 mmol/L   Potassium 4.4 3.5 - 5.1 mmol/L   Chloride 107 98 - 111 mmol/L   CO2 26 22 - 32 mmol/L   Glucose, Bld 93 70 - 99 mg/dL   BUN 21 8 - 23 mg/dL   Creatinine, Ser 1.16 (H) 0.44 - 1.00 mg/dL   Calcium 8.8 (L) 8.9 - 10.3 mg/dL   Total Protein 7.1 6.5 - 8.1 g/dL   Albumin 2.4 (L) 3.5 - 5.0 g/dL   AST 66 (H) 15 - 41 U/L   ALT  15 0 - 44 U/L   Alkaline Phosphatase 214 (H) 38 - 126 U/L   Total Bilirubin 0.9 0.3 - 1.2 mg/dL   GFR, Estimated 51 (L) >60 mL/min   Anion gap 7 5 - 15  Urinalysis, Routine w reflex microscopic  Result Value Ref Range   Color, Urine YELLOW YELLOW   APPearance CLEAR CLEAR   Specific Gravity, Urine 1.020 1.005 - 1.030   pH 5.5 5.0 - 8.0   Glucose, UA 100 (A) NEGATIVE mg/dL   Hgb urine dipstick NEGATIVE NEGATIVE   Bilirubin Urine NEGATIVE NEGATIVE   Ketones, ur NEGATIVE NEGATIVE mg/dL   Protein, ur NEGATIVE NEGATIVE mg/dL   Nitrite NEGATIVE NEGATIVE   Leukocytes,Ua NEGATIVE NEGATIVE  Urine rapid drug screen (hosp performed)  Result Value Ref Range   Opiates NONE DETECTED NONE DETECTED   Cocaine NONE DETECTED  NONE DETECTED   Benzodiazepines NONE DETECTED NONE DETECTED   Amphetamines NONE DETECTED NONE DETECTED   Tetrahydrocannabinol NONE DETECTED NONE DETECTED   Barbiturates NONE DETECTED NONE DETECTED  Lactic acid, plasma  Result Value Ref Range   Lactic Acid, Venous 1.6 0.5 - 1.9 mmol/L  Lactic acid, plasma  Result Value Ref Range   Lactic Acid, Venous 1.2 0.5 - 1.9 mmol/L  CBG monitoring, ED  Result Value Ref Range   Glucose-Capillary 75 70 - 99 mg/dL  I-Stat Chem 8, ED  Result Value Ref Range   Sodium 141 135 - 145 mmol/L   Potassium 4.6 3.5 - 5.1 mmol/L   Chloride 106 98 - 111 mmol/L   BUN 29 (H) 8 - 23 mg/dL   Creatinine, Ser 1.20 (H) 0.44 - 1.00 mg/dL   Glucose, Bld 83 70 - 99 mg/dL   Calcium, Ion 1.17 1.15 - 1.40 mmol/L   TCO2 31 22 - 32 mmol/L   Hemoglobin 12.9 12.0 - 15.0 g/dL   HCT 38.0 36.0 - 46.0 %   CT Maxillofacial Wo Contrast  Result Date: 06/26/2022 CLINICAL DATA:  Blunt facial trauma. EXAM: CT MAXILLOFACIAL WITHOUT CONTRAST TECHNIQUE: Multidetector CT imaging of the maxillofacial structures was performed. Multiplanar CT image reconstructions were also generated. RADIATION DOSE REDUCTION: This exam was performed according to the departmental  dose-optimization program which includes automated exposure control, adjustment of the mA and/or kV according to patient size and/or use of iterative reconstruction technique. COMPARISON:  None Available. FINDINGS: Osseous: No fracture or mandibular dislocation. No destructive process. Orbits: Negative. No traumatic or inflammatory finding. Sinuses: Mild mucosal thickening of the ethmoid and maxillary sinuses. Soft tissues: Left periorbital preseptal soft tissue swelling. Limited intracranial: No significant or unexpected finding. IMPRESSION: 1. No evidence of facial fractures. 2. Left periorbital preseptal soft tissue swelling. 3. Mild mucosal thickening of the ethmoid and maxillary sinuses. Electronically Signed   By: Fidela Salisbury M.D.   On: 06/26/2022 16:37   CT Head Wo Contrast  Result Date: 06/26/2022 CLINICAL DATA:  Blunt facial trauma. EXAM: CT HEAD WITHOUT CONTRAST TECHNIQUE: Contiguous axial images were obtained from the base of the skull through the vertex without intravenous contrast. RADIATION DOSE REDUCTION: This exam was performed according to the departmental dose-optimization program which includes automated exposure control, adjustment of the mA and/or kV according to patient size and/or use of iterative reconstruction technique. COMPARISON:  June 17, 2022 FINDINGS: Brain: No evidence of acute infarction, hemorrhage, hydrocephalus, extra-axial collection or mass lesion/mass effect. Vascular: No hyperdense vessel or unexpected calcification. Skull: Normal. Negative for fracture or focal lesion. Sinuses/Orbits: Mild mucosal thickening of the maxillary in ethmoid sinuses. Left preseptal soft tissue swelling. Other: None. IMPRESSION: 1. No acute intracranial abnormality. 2. Left preseptal soft tissue swelling. Electronically Signed   By: Fidela Salisbury M.D.   On: 06/26/2022 16:34   ECHO TEE  Result Date: 06/24/2022    TRANSESOPHOGEAL ECHO REPORT   Patient Name:   JOSSLIN SANJUAN  Date of Exam: 06/23/2022 Medical Rec #:  [id number]    Height:       62.0 in Accession #:    [id number]   Weight:       189.0 lb Date of Birth:  [birth year]   BSA:          1.866 m Patient Age:    57 years     BP:           100/50 mmHg Patient Gender: F  HR:           68 bpm. Exam Location:  Inpatient Procedure: Transesophageal Echo, Color Doppler and Cardiac Doppler Indications:     Bacteremia  History:         Patient has prior history of Echocardiogram examinations, most                  recent 06/16/2021. CHF, Defibrillator; Risk                  Factors:Hypertension, Diabetes and Dyslipidemia.  Sonographer:     Raquel Sarna Senior RDCS Referring Phys:  Sande Rives, E Diagnosing Phys: Cherlynn Kaiser MD PROCEDURE: After discussion of the risks and benefits of a TEE, an informed consent was obtained from the patient. TEE procedure time was 11 minutes. The transesophogeal probe was passed without difficulty through the esophogus of the patient. Imaged were obtained with the patient in a left lateral decubitus position. Local oropharyngeal anesthetic was provided with Cetacaine. Sedation performed by different physician. The patient was monitored while under deep sedation. Anesthestetic sedation was provided intravenously by Anesthesiology: '131mg'$  of Propofol. Image quality was good. The patient developed hypotension during the procedure, and study was focused to answer the clinical question as a result. IMPRESSIONS  1. Left ventricular ejection fraction, by estimation, is 25 to 30%. The left ventricle has severely decreased function. There is abnormal septal motion with dyskinesis.  2. Right ventricular systolic function is mildly reduced. The right ventricular size is moderately enlarged.  3. No left atrial/left atrial appendage thrombus was detected.  4. Right atrial size was moderately dilated.  5. The mitral valve is grossly normal. Trivial mitral valve regurgitation.  6. Tricuspid valve regurgitation is  moderate. Query some degree of septal leaflet impingement as part of mechanism of TR.  7. The aortic valve is tricuspid. There is moderate calcification of the aortic valve. Aortic valve regurgitation is mild. No aortic stenosis is present.  8. There is Moderate (Grade III) atheroma plaque involving the aortic arch. Conclusion(s)/Recommendation(s): No evidence of vegetation/infective endocarditis on this transesophageael echocardiogram. FINDINGS  Left Ventricle: Left ventricular ejection fraction, by estimation, is 25 to 30%. The left ventricle has severely decreased function. The left ventricular internal cavity size was normal in size. Abnormal septal motion with dyskinesis. Right Ventricle: The right ventricular size is moderately enlarged. No increase in right ventricular wall thickness. Right ventricular systolic function is mildly reduced. Left Atrium: Left atrial size was normal in size. No left atrial/left atrial appendage thrombus was detected. Right Atrium: Right atrial size was moderately dilated. Pericardium: There is no evidence of pericardial effusion. Mitral Valve: The mitral valve is grossly normal. Trivial mitral valve regurgitation. Tricuspid Valve: The tricuspid valve is grossly normal. Tricuspid valve regurgitation is moderate. Aortic Valve: The aortic valve is tricuspid. There is moderate calcification of the aortic valve. Aortic valve regurgitation is mild. No aortic stenosis is present. Pulmonic Valve: The pulmonic valve was normal in structure. Pulmonic valve regurgitation is trivial. No evidence of pulmonic stenosis. Aorta: The aortic root and ascending aorta are structurally normal, with no evidence of dilitation. There is moderate (Grade III) atheroma plaque involving the aortic arch. IAS/Shunts: The interatrial septum was not assessed.   AORTA Ao Sinus diam: 3.20 cm Ao Asc diam:   3.10 cm TRICUSPID VALVE TR Peak grad:   10.5 mmHg TR Vmax:        162.00 cm/s Cherlynn Kaiser MD  Electronically signed by Cherlynn Kaiser MD Signature Date/Time: 06/24/2022/5:12:30 AM  Final    VAS [place] LOWER EXTREMITY VENOUS (DVT) (ONLY [facility] & [facility])  Result Date: 06/17/2022  Lower Venous DVT Study Patient Name:  ATHIRA JANOWICZ  Date of Exam:   06/17/2022 Medical Rec #: [id number]     Accession #:    [id number] Date of Birth: 1950/12/22    Patient Gender: F Patient Age:   62 years Exam Location:  [facility] Procedure:      VAS [place] LOWER EXTREMITY VENOUS (DVT) Referring Phys: Ripley Fraise --------------------------------------------------------------------------------  Indications: Edema.  Limitations: Body habitus, poor ultrasound/tissue interface and Uncooperative secondary to pain. Comparison Study: No prior study Performing Technologist: Maudry Mayhew MHA, RDMS, RVT, RDCS  Examination Guidelines: A complete evaluation includes B-mode imaging, spectral Doppler, color Doppler, and power Doppler as needed of all accessible portions of each vessel. Bilateral testing is considered an integral part of a complete examination. Limited examinations for reoccurring indications may be performed as noted. The reflux portion of the exam is performed with the patient in reverse Trendelenburg.  +---------+---------------+---------+-----------+---------------+--------------+ RIGHT    CompressibilityPhasicitySpontaneityProperties     Thrombus Aging +---------+---------------+---------+-----------+---------------+--------------+ CFV      Full           Yes      Yes                                      +---------+---------------+---------+-----------+---------------+--------------+ SFJ      Full                                                             +---------+---------------+---------+-----------+---------------+--------------+ FV Prox  Full                                                             +---------+---------------+---------+-----------+---------------+--------------+  FV Mid   Full                                                             +---------+---------------+---------+-----------+---------------+--------------+ FV DistalFull                                                             +---------+---------------+---------+-----------+---------------+--------------+ PFV      Full                                                             +---------+---------------+---------+-----------+---------------+--------------+ POP  Yes      Yes        patent by color                                                           and PW Doppler                +---------+---------------+---------+-----------+---------------+--------------+ PTV      Full                                                             +---------+---------------+---------+-----------+---------------+--------------+ PERO     Full                                                             +---------+---------------+---------+-----------+---------------+--------------+   Left Technical Findings: Not visualized segments include CFV.   Summary: RIGHT: - There is no evidence of deep vein thrombosis in the lower extremity. However, portions of this examination were limited- see technologist comments above.  - No cystic structure found in the popliteal fossa.   *See table(s) above for measurements and observations. Electronically signed by Servando Snare MD on 06/17/2022 at 4:23:50 PM.    Final    CT Head Wo Contrast  Result Date: 06/17/2022 CLINICAL DATA:  Head trauma. EXAM: CT HEAD WITHOUT CONTRAST TECHNIQUE: Contiguous axial images were obtained from the base of the skull through the vertex without intravenous contrast. RADIATION DOSE REDUCTION: This exam was performed according to the departmental dose-optimization program which includes automated exposure control, adjustment of the mA and/or kV according to patient size and/or use of iterative  reconstruction technique. COMPARISON:  June 14, 2022. FINDINGS: Brain: No evidence of acute infarction, hemorrhage, hydrocephalus, extra-axial collection or mass lesion/mass effect. Vascular: No hyperdense vessel or unexpected calcification. Skull: Normal. Negative for fracture or focal lesion. Sinuses/Orbits: No acute finding. Other: None. IMPRESSION: No acute intracranial abnormality seen. Electronically Signed   By: Marijo Conception M.D.   On: 06/17/2022 10:57   DG Tibia/Fibula Right  Result Date: 06/17/2022 CLINICAL DATA:  Questionable sepsis; evaluate for abnormality EXAM: RIGHT TIBIA AND FIBULA - 2 VIEW COMPARISON:  Radiographs 12/04/2019 FINDINGS: Plate and screw fixation of the distal right tibia and fibula. No radiographic evidence of loosening. No acute fracture or dislocation. Subcutaneous edema. IMPRESSION: No acute osseous abnormality. Electronically Signed   By: Placido Sou M.D.   On: 06/17/2022 03:09   DG Chest Port 1 View  Result Date: 06/17/2022 CLINICAL DATA:  Questionable sepsis; evaluate for abnormality EXAM: PORTABLE CHEST 1 VIEW COMPARISON:  Radiographs 06/14/2022 FINDINGS: No change from 06/14/2022.  Cardiomegaly.  ICD. Similar mildly increased interstitial opacities in the right upper lung. No focal consolidation, pleural effusion, or pneumothorax. No acute osseous abnormality. IMPRESSION: Interstitial prominence in the right upper lung may be due to asymmetric edema or atypical infection. Electronically Signed   By: Placido Sou M.D.   On: 06/17/2022 03:07  DG Chest Portable 1 View  Result Date: 06/14/2022 CLINICAL DATA:  Cough. EXAM: PORTABLE CHEST 1 VIEW COMPARISON:  Feb 09, 2022. FINDINGS: Stable cardiomegaly. Left-sided defibrillator is unchanged in position. Lungs are clear. Bony thorax is unremarkable. IMPRESSION: No active disease. Electronically Signed   By: Marijo Conception M.D.   On: 06/14/2022 15:34   CT HEAD WO CONTRAST  Result Date: 06/14/2022 CLINICAL  DATA:  Head trauma.  Syncope last week.  Head injury. EXAM: CT HEAD WITHOUT CONTRAST CT CERVICAL SPINE WITHOUT CONTRAST TECHNIQUE: Multidetector CT imaging of the head and cervical spine was performed following the standard protocol without intravenous contrast. Multiplanar CT image reconstructions of the cervical spine were also generated. RADIATION DOSE REDUCTION: This exam was performed according to the departmental dose-optimization program which includes automated exposure control, adjustment of the mA and/or kV according to patient size and/or use of iterative reconstruction technique. COMPARISON:  CT head 02/12/2016 FINDINGS: CT HEAD FINDINGS Brain: No evidence of acute infarction, hemorrhage, hydrocephalus, extra-axial collection or mass lesion/mass effect. Mild hypodensity in the white matter bilaterally most consistent with chronic microvascular ischemia. Small chronic infarct right posterior cerebellum. Empty sella with enlargement of the sella filled with CSF, unchanged from 2017 Vascular: Negative for hyperdense vessel Skull: Negative Sinuses/Orbits: Mild mucosal edema paranasal sinuses. Right cataract extraction. No orbital lesion. Other: None CT CERVICAL SPINE FINDINGS Alignment: Normal Skull base and vertebrae: Negative for fracture 5 x 8 mm sclerotic lesion C7 vertebral body on the right. This is well-circumscribed and benign-appearing. No other lesion identified. Soft tissues and spinal canal: 15 mm hypodense nodule left lateral thyroid. 1 cm nodule right thyroid. No enlarged lymph nodes in the neck. Disc levels:  Disc spaces maintained.  No significant stenosis. Upper chest: Mild airspace disease right upper lobe which could be acute or chronic. Subpleural bleb right upper lobe. Other: None IMPRESSION: 1. No acute intracranial abnormality. Chronic microvascular ischemic type changes in the white matter. 2. Negative for cervical spine fracture 3. 5 x 8 mm sclerotic lesion right C7 deep vertebral  body. This is well-circumscribed and appears benign. Assuming the patient has known malignancy, this is likely a benign lesion such as bone island. 4. 15 mm lesion left lateral thyroid. 10 mm lesion right thyroid. Thyroid ultrasound recommended. (Ref: J Am Coll Radiol. 2015 Feb;12(2): 143-50). Electronically Signed   By: Franchot Gallo M.D.   On: 06/14/2022 15:12   CT CERVICAL SPINE WO CONTRAST  Result Date: 06/14/2022 CLINICAL DATA:  Head trauma.  Syncope last week.  Head injury. EXAM: CT HEAD WITHOUT CONTRAST CT CERVICAL SPINE WITHOUT CONTRAST TECHNIQUE: Multidetector CT imaging of the head and cervical spine was performed following the standard protocol without intravenous contrast. Multiplanar CT image reconstructions of the cervical spine were also generated. RADIATION DOSE REDUCTION: This exam was performed according to the departmental dose-optimization program which includes automated exposure control, adjustment of the mA and/or kV according to patient size and/or use of iterative reconstruction technique. COMPARISON:  CT head 02/12/2016 FINDINGS: CT HEAD FINDINGS Brain: No evidence of acute infarction, hemorrhage, hydrocephalus, extra-axial collection or mass lesion/mass effect. Mild hypodensity in the white matter bilaterally most consistent with chronic microvascular ischemia. Small chronic infarct right posterior cerebellum. Empty sella with enlargement of the sella filled with CSF, unchanged from 2017 Vascular: Negative for hyperdense vessel Skull: Negative Sinuses/Orbits: Mild mucosal edema paranasal sinuses. Right cataract extraction. No orbital lesion. Other: None CT CERVICAL SPINE FINDINGS Alignment: Normal Skull base and vertebrae: Negative for fracture 5  x 8 mm sclerotic lesion C7 vertebral body on the right. This is well-circumscribed and benign-appearing. No other lesion identified. Soft tissues and spinal canal: 15 mm hypodense nodule left lateral thyroid. 1 cm nodule right thyroid. No  enlarged lymph nodes in the neck. Disc levels:  Disc spaces maintained.  No significant stenosis. Upper chest: Mild airspace disease right upper lobe which could be acute or chronic. Subpleural bleb right upper lobe. Other: None IMPRESSION: 1. No acute intracranial abnormality. Chronic microvascular ischemic type changes in the white matter. 2. Negative for cervical spine fracture 3. 5 x 8 mm sclerotic lesion right C7 deep vertebral body. This is well-circumscribed and appears benign. Assuming the patient has known malignancy, this is likely a benign lesion such as bone island. 4. 15 mm lesion left lateral thyroid. 10 mm lesion right thyroid. Thyroid ultrasound recommended. (Ref: J Am Coll Radiol. 2015 Feb;12(2): 143-50). Electronically Signed   By: Franchot Gallo M.D.   On: 06/14/2022 15:12   DG Clavicle Left  Result Date: 05/30/2022 CLINICAL DATA:  Trauma, fall EXAM: LEFT CLAVICLE - 2+ VIEWS COMPARISON:  None Available. FINDINGS: No fracture is seen in left clavicle. Degenerative changes are noted in left shoulder and left AC joints. Small calcifications adjacent to the proximal humerus may suggest calcific bursitis or calcific tendinosis. Pacemaker battery is seen in the left infraclavicular region. IMPRESSION: No fracture is seen in the left clavicle. Degenerative changes are noted in the left [facility] joint and left shoulder joint. Electronically Signed   By: Elmer Picker M.D.   On: 05/30/2022 12:00       Domenic Moras, PA-C 06/26/22 2122    Malvin Johns, MD 06/26/22 2133

## 2022-06-26 NOTE — ED Triage Notes (Addendum)
Patient presents to ed via [facility] states she was recently discharged from hospital fri after septic w/u. Family states she was walking with a walker today and they heard a noise and went to find patient laying on the floor. Upon ems arrival patient was alert oriented x 2 to name and place. Small abrasion to outer aspect of left eye. Bleeding controled. Patient CBG was 39 patient was given 3 glasses sunny D and pack of peanut butter crackers. CBG came up to 89. Upon arrival to ED alert , not talking at this time., PA at bedside. Patient did not eat breakfast this am nor did she take her insulin

## 2022-06-26 NOTE — ED Provider Notes (Signed)
[facility] EMERGENCY DEPARTMENT Provider Note   CSN: [id number] Arrival date & time: 06/26/22  1350     History  Chief Complaint  Patient presents with   Altered Mental Status    [name] is a 71 y.o. female.  Patient presents to the hospital via EMS secondary to a fall and hypoglycemia.  EMS was called by the patient's family after the patient fell forward, hitting her face on the ground.  Upon arrival they noticed the patient was lethargic.  Glucose was checked at the scene and was found to be 38.  The patient was alert enough to be able to eat crackers and drink juice.  EMS states that her sugar slowly rose into the 80-90 range.  The patient had obvious hematoma above the left eye.  Upon arrival the patient continues to be obtunded.  She does respond to voice and states "nothing hurts ".  She is able to say that she has at the hospital.  She is currently not verbalizing beyond those answers.  The patient felt significantly cool upon arrival and a rectal temp was checked showing a core temperature of 90.4 F.  Vital signs were otherwise unremarkable.  CBG upon arrival was 75.  The patient was placed on a Bair hugger and warm IV fluids were ordered.  The patient was discharged from the hospital on September 22, 2 days ago, after being admitted for sepsis due to cellulitis of the right lower extremity.  Bacteremia was noted with group G strep.  Other relevant past medical history includes uncontrolled type 2 diabetes mellitus with hyperglycemia, implantable cardioverter defibrillator in place, chronic combined systolic and diastolic congestive heart failure, restrictive lung disease, AKI, hyperkalemia.  The patient was discharged on amoxicillin for 8 days to cover for her right lower extremity cellulitis.  HPI     Home Medications Prior to Admission medications   Medication Sig Start Date End Date Taking? Authorizing Provider  albuterol (VENTOLIN HFA) 108 (90 Base)  MCG/ACT inhaler Inhale 2 puffs into the lungs every 6 (six) hours as needed for wheezing or shortness of breath. 04/28/21   Maudie Mercury, MD  amoxicillin (AMOXIL) 500 MG capsule Take 2 capsules (1,000 mg total) by mouth every 8 (eight) hours. 06/24/22   Mapp, Claudia Desanctis, MD  aspirin EC 81 MG tablet Take 81 mg by mouth daily. Swallow whole.    [provider]  atorvastatin (LIPITOR) 80 MG tablet Take 1 tablet (80 mg total) by mouth at bedtime. 06/24/21   Maudie Mercury, MD  Blood Glucose Monitoring Suppl (ACCU-CHEK GUIDE ME) w/Device KIT by Does not apply route.    [provider]  calcium-vitamin D (OSCAL WITH D) 500-200 MG-UNIT tablet Take 1 tablet by mouth 2 (two) times daily. 02/12/20   Kathi Ludwig, MD  Continuous Blood Gluc Receiver (DEXCOM G7 RECEIVER) DEVI Use with  Dexcom G7 sensor to monitor blood sugar continuously 03/17/22   Aldine Contes, MD  Continuous Blood Gluc Sensor (DEXCOM G7 SENSOR) MISC Place new sensor every 10 days and use to monitor blood sugar continuously 03/17/22   Aldine Contes, MD  dapagliflozin propanediol (FARXIGA) 10 MG TABS tablet Take 1 tablet (10 mg total) by mouth daily. 05/02/22   Gaylan Gerold, DO  dupilumab (DUPIXENT) 200 MG/1.14ML prefilled syringe Inject 200 mg into the skin every 14 (fourteen) days. Every other Friday    [provider]  furosemide (LASIX) 40 MG tablet Take 1 tablet (40 mg total) by mouth 2 (two)  times daily. 06/25/21   Milford, Maricela Bo, FNP  glucose blood (TRUE FOCUS BLOOD GLUCOSE STRIP) test strip Use to check blood sugar 3 times a day 11/23/21   Lacinda Axon, MD  insulin degludec (TRESIBA) 100 UNIT/ML FlexTouch Pen Inject 10 Units into the skin daily. 06/24/22   Mapp, Claudia Desanctis, MD  Insulin lispro (HUMALOG JUNIOR KWIKPEN) 100 UNIT/ML Inject 6 Units into the skin 3 (three) times daily. 12/21/21   Masters, Katie, DO  Insulin Pen Needle (PEN NEEDLES) 32G X 4 MM MISC Use to inject insulin 3 times times per day  12/21/21   Masters, Joellen Jersey, DO  Lancets Misc. (ACCU-CHEK FASTCLIX LANCET) KIT Lancing device, use with lancets for blood glucose monitoring. 07/17/18   Kathi Ludwig, MD  Lancets MISC 1 Act by Does not apply route 4 (four) times daily. 07/11/18   Kathi Ludwig, MD  liraglutide (VICTOZA) 18 MG/3ML SOPN Inject 1.29m daily into the skin 12/26/19   HKathi Ludwig MD  metoprolol succinate (TOPROL-XL) 50 MG 24 hr tablet TAKE 1 TABLET(50 MG) BY MOUTH DAILY WITH OR IMMEDIATELY FOLLOWING A MEAL Patient taking differently: Take 50 mg by mouth daily. 06/24/21   WMaudie Mercury MD  umeclidinium-vilanterol ([facility]) 62.5-25 MCG/ACT AEPB Inhale 1 puff into the lungs daily. 12/21/21   Masters, Katie, DO      Allergies    Actos [pioglitazone], Naproxen, Rosiglitazone, Tomato, Hydrocodone-acetaminophen, and Hydrocortisone    Review of Systems   Review of Systems  Reason unable to perform ROS: Patient is obtunded.    Physical Exam Updated Vital Signs BP (!) 147/77   Pulse 97   Temp (!) 90.4 F (32.4 C) (Rectal)   Resp 17   Ht 5' 2"  (1.575 m)   Wt 94 kg   SpO2 100%   BMI 37.90 kg/m  Physical Exam Vitals and nursing note reviewed.  Constitutional:      Appearance: She is ill-appearing.     Comments: Alert to person and place  HENT:     Head: Normocephalic.     Comments: Hematoma noted over left eye    Mouth/Throat:     Mouth: Mucous membranes are moist.  Eyes:     Conjunctiva/sclera: Conjunctivae normal.     Pupils: Pupils are equal, round, and reactive to light.  Cardiovascular:     Rate and Rhythm: Normal rate and regular rhythm.  Pulmonary:     Effort: Pulmonary effort is normal.     Breath sounds: Normal breath sounds.  Abdominal:     Palpations: Abdomen is soft.     Tenderness: There is no abdominal tenderness.  Musculoskeletal:        General: No deformity.     Cervical back: Neck supple.  Skin:    Comments: Skin is cool to the touch  Neurological:      Comments: Unable to perform at this time due to underlying mental status     ED Results / Procedures / Treatments   Labs (all labs ordered are listed, but only abnormal results are displayed) Labs Reviewed  CBC WITH DIFFERENTIAL/PLATELET - Abnormal; Notable for the following components:      Result Value   Hemoglobin 11.8 (*)    MCH 25.5 (*)    MCHC 29.4 (*)    RDW 16.5 (*)    Platelets 421 (*)    All other components within normal limits  CULTURE, BLOOD (ROUTINE X 2)  CULTURE, BLOOD (ROUTINE X 2)  COMPREHENSIVE METABOLIC PANEL  URINALYSIS, ROUTINE W REFLEX  MICROSCOPIC  RAPID URINE DRUG SCREEN, HOSP PERFORMED  LACTIC ACID, PLASMA  LACTIC ACID, PLASMA  CBG MONITORING, ED  I-STAT CHEM 8, ED  I-STAT VENOUS BLOOD GAS, ED    EKG EKG Interpretation  Date/Time:  Sunday June 26 2022 14:20:08 EDT Ventricular Rate:  68 PR Interval:  171 QRS Duration: 103 QT Interval:  632 QTC Calculation: 673 R Axis:   118 Text Interpretation: Sinus rhythm Low voltage with right axis deviation Minimal ST elevation, lateral leads new Prolonged QT interval Artifact Confirmed by Blanchie Dessert [phone]) on 06/26/2022 2:37:23 PM  Radiology No results found.  Procedures Procedures    Medications Ordered in ED Medications  dextrose 5 % solution (has no administration in time range)    ED Course/ Medical Decision Making/ A&P                           Medical Decision Making Amount and/or Complexity of Data Reviewed Labs: ordered. Radiology: ordered.   This patient presents to the ED for concern of altered mental status and hypoglycemia, this involves an extensive number of treatment options, and is a complaint that carries with it a high risk of complications and morbidity.  The differential diagnosis includes hypoglycemia, sepsis, respiratory failure, cardiac failure, and others   Co morbidities that complicate the patient evaluation  Patient with recent admission for sepsis,  cellulitis's.  ICD.  Uncontrolled type II DM.   Additional history obtained:  Additional history obtained from EMS External records from outside source obtained and reviewed including discharge summary from recent hospitalization   Lab Tests:  I Ordered labs including VBG, blood cultures, lactic acid, CMP, CBC. Results pending at this time    Imaging Studies ordered:  I ordered imaging studies including Ct head, ct maxillofacial  Imaging is pending at this time   Cardiac Monitoring: / EKG:  The patient was maintained on a cardiac monitor.  I personally viewed and interpreted the cardiac monitored which showed an underlying rhythm of: sinus rhythm   Problem List / ED Course / Critical interventions / Medication management   I ordered medication including warmed LR  for rehydration and warming    Test / Admission - Considered:  The patient is significantly hypothermic with a core temperature of 90.49F. The patient will need admission to the hospital for stabilization and further care. Results of labs and images are pending at this time. Domenic Moras, PA-C is taking over care at shift handoff.  Plan to consult internal medicine teaching service once results obtained        Final Clinical Impression(s) / ED Diagnoses Final diagnoses:  Altered mental status, unspecified altered mental status type  Hypoglycemia  Hypothermia, initial encounter    Rx / DC Orders ED Discharge Orders     None         Ronny Bacon 06/26/22 1516    Blanchie Dessert, MD 06/29/22 1312

## 2022-06-27 ENCOUNTER — Other Ambulatory Visit: Payer: Self-pay

## 2022-06-27 ENCOUNTER — Observation Stay (HOSPITAL_COMMUNITY): Payer: Medicare Other

## 2022-06-27 DIAGNOSIS — R296 Repeated falls: Secondary | ICD-10-CM

## 2022-06-27 DIAGNOSIS — T68XXXA Hypothermia, initial encounter: Secondary | ICD-10-CM

## 2022-06-27 DIAGNOSIS — E11649 Type 2 diabetes mellitus with hypoglycemia without coma: Secondary | ICD-10-CM

## 2022-06-27 DIAGNOSIS — Z794 Long term (current) use of insulin: Secondary | ICD-10-CM

## 2022-06-27 DIAGNOSIS — R680 Hypothermia, not associated with low environmental temperature: Secondary | ICD-10-CM | POA: Diagnosis not present

## 2022-06-27 DIAGNOSIS — I4581 Long QT syndrome: Secondary | ICD-10-CM | POA: Diagnosis not present

## 2022-06-27 DIAGNOSIS — F17210 Nicotine dependence, cigarettes, uncomplicated: Secondary | ICD-10-CM

## 2022-06-27 LAB — IRON AND TIBC
Iron: 52 ug/dL (ref 28–170)
Saturation Ratios: 28 % (ref 10.4–31.8)
TIBC: 185 ug/dL — ABNORMAL LOW (ref 250–450)
UIBC: 133 ug/dL

## 2022-06-27 LAB — COMPREHENSIVE METABOLIC PANEL
ALT: 12 U/L (ref 0–44)
AST: 33 U/L (ref 15–41)
Albumin: 1.9 g/dL — ABNORMAL LOW (ref 3.5–5.0)
Alkaline Phosphatase: 174 U/L — ABNORMAL HIGH (ref 38–126)
Anion gap: 4 — ABNORMAL LOW (ref 5–15)
BUN: 21 mg/dL (ref 8–23)
CO2: 27 mmol/L (ref 22–32)
Calcium: 8.4 mg/dL — ABNORMAL LOW (ref 8.9–10.3)
Chloride: 109 mmol/L (ref 98–111)
Creatinine, Ser: 1.16 mg/dL — ABNORMAL HIGH (ref 0.44–1.00)
GFR, Estimated: 51 mL/min — ABNORMAL LOW (ref >60)
Glucose, Bld: 167 mg/dL — ABNORMAL HIGH (ref 70–99)
Potassium: 4.3 mmol/L (ref 3.5–5.1)
Sodium: 140 mmol/L (ref 135–145)
Total Bilirubin: 0.6 mg/dL (ref 0.3–1.2)
Total Protein: 5.7 g/dL — ABNORMAL LOW (ref 6.5–8.1)

## 2022-06-27 LAB — T4, FREE: Free T4: 1.2 ng/dL — ABNORMAL HIGH (ref 0.61–1.12)

## 2022-06-27 LAB — CBC
HCT: 32.6 % — ABNORMAL LOW (ref 36.0–46.0)
Hemoglobin: 10.2 g/dL — ABNORMAL LOW (ref 12.0–15.0)
MCH: 26.2 pg (ref 26.0–34.0)
MCHC: 31.3 g/dL (ref 30.0–36.0)
MCV: 83.6 fL (ref 80.0–100.0)
Platelets: 396 10*3/uL (ref 150–400)
RBC: 3.9 MIL/uL (ref 3.87–5.11)
RDW: 16.5 % — ABNORMAL HIGH (ref 11.5–15.5)
WBC: 7.5 10*3/uL (ref 4.0–10.5)
nRBC: 0 % (ref 0.0–0.2)

## 2022-06-27 LAB — CBG MONITORING, ED
Glucose-Capillary: 161 mg/dL — ABNORMAL HIGH (ref 70–99)
Glucose-Capillary: 165 mg/dL — ABNORMAL HIGH (ref 70–99)

## 2022-06-27 LAB — HIV ANTIBODY (ROUTINE TESTING W REFLEX): HIV Screen 4th Generation wRfx: NONREACTIVE

## 2022-06-27 LAB — GLUCOSE, CAPILLARY
Glucose-Capillary: 93 mg/dL (ref 70–99)
Glucose-Capillary: 107 mg/dL — ABNORMAL HIGH (ref 70–99)

## 2022-06-27 LAB — BRAIN NATRIURETIC PEPTIDE: B Natriuretic Peptide: 418.1 pg/mL — ABNORMAL HIGH (ref 0.0–100.0)

## 2022-06-27 LAB — TSH: TSH: 2.681 u[IU]/mL (ref 0.350–4.500)

## 2022-06-27 LAB — VITAMIN B12: Vitamin B-12: 1377 pg/mL — ABNORMAL HIGH (ref 180–914)

## 2022-06-27 LAB — FERRITIN: Ferritin: 217 ng/mL (ref 11–307)

## 2022-06-27 MED ORDER — METOPROLOL SUCCINATE ER 50 MG PO TB24
50.0000 mg | ORAL_TABLET | Freq: Every day | ORAL | Status: DC
  Administered 2022-06-27: 50 mg via ORAL
  Filled 2022-06-27: qty 2

## 2022-06-27 MED ORDER — ALBUTEROL SULFATE (2.5 MG/3ML) 0.083% IN NEBU: 3.0000 mL | INHALATION_SOLUTION | Freq: Four times a day (QID) | RESPIRATORY_TRACT | Status: DC | PRN

## 2022-06-27 MED ORDER — FUROSEMIDE 40 MG PO TABS
40.0000 mg | ORAL_TABLET | Freq: Two times a day (BID) | ORAL | Status: DC
  Administered 2022-06-27: 40 mg via ORAL
  Filled 2022-06-27: qty 2
  Filled 2022-06-27: qty 1

## 2022-06-27 MED ORDER — ATORVASTATIN CALCIUM 80 MG PO TABS
80.0000 mg | ORAL_TABLET | Freq: Every day | ORAL | Status: DC
  Administered 2022-06-27 – 2022-06-28 (×3): 80 mg via ORAL
  Filled 2022-06-27: qty 2
  Filled 2022-06-27 (×2): qty 1

## 2022-06-27 NOTE — Progress Notes (Addendum)
2nd VAST team RN assessed, no appropriate vein found.  Recommend central line.  At this time no IV infusions ordered.  Gabby RN notified by phone.

## 2022-06-27 NOTE — Evaluation (Signed)
Occupational Therapy Evaluation Patient Details Name: [name] MRN: [id number] DOB: January 19, 1951 Today's Date: 06/27/2022   History of Present Illness 71 yo female admitted after a fall at home. Noted to be hypoglycemic (BG 38) and hypothermic. Recently admitted for sepsis in setting of cellulitis. PMH: ICD, DM2, restrictive lung disease, CHF   Clinical Impression   PTA, pt lives alone, uses Rollator for mobility and typically Modified Independent with ADLs/IADLs. Pt has a friend that has been assisting some with IADLs since previous admission. Pt presents now with deficits in standing balance, endurance and strength. Pt requires Min A (+2 for safety with stretcher height) for bed mobility and transfers using RW. Pt requires Setup for UB ADL and overall Min A for LB ADLs. Pt noted with significant BP drop with activity though denied any associated symptoms. Based on recent falls (6 in past month) and current abilities, rec SNF rehab at DC. If pt declines SNF, would recommend HHOT follow up.       Recommendations for follow up therapy are one component of a multi-disciplinary discharge planning process, led by the attending physician.  Recommendations may be updated based on patient status, additional functional criteria and insurance authorization.   Follow Up Recommendations  Skilled nursing-short term rehab (<3 hours/day)    Assistance Recommended at Discharge Intermittent Supervision/Assistance  Patient can return home with the following A little help with bathing/dressing/bathroom;Two people to help with bathing/dressing/bathroom;Direct supervision/assist for medications management;Direct supervision/assist for financial management    Functional Status Assessment  Patient has had a recent decline in their functional status and demonstrates the ability to make significant improvements in function in a reasonable and predictable amount of time.  Equipment Recommendations  None  recommended by OT    Recommendations for Other Services       Precautions / Restrictions Precautions Precautions: Fall;Other (comment) Precaution Comments: monitor BP Restrictions Weight Bearing Restrictions: No      Mobility Bed Mobility Overal bed mobility: Needs Assistance Bed Mobility: Supine to Sit, Sit to Supine     Supine to sit: Mod assist Sit to supine: Mod assist   General bed mobility comments: Mod A to bring LEs off of stretcher with handheld assist to lift trunk. Mod A to get BLE back onto high stretcher    Transfers Overall transfer level: Needs assistance Equipment used: Rolling walker (2 wheels) Transfers: Sit to/from Stand, Bed to chair/wheelchair/BSC Sit to Stand: Min assist, +2 safety/equipment     Step pivot transfers: Min assist, +2 safety/equipment     General transfer comment: Min A (+2 for safety d/t high stretcher height) to stand with RW, transfer to recliner for linen change. Increased assist needed to get bottom back onto stretcher d/t short stature.      Balance Overall balance assessment: Needs assistance Sitting-balance support: No upper extremity supported, Feet supported Sitting balance-Leahy Scale: Good     Standing balance support: Bilateral upper extremity supported Standing balance-Leahy Scale: Poor                             ADL either performed or assessed with clinical judgement   ADL Overall ADL's : Needs assistance/impaired Eating/Feeding: Set up;Sitting   Grooming: Set up;Sitting   Upper Body Bathing: Minimal assistance;Sitting   Lower Body Bathing: Minimal assistance;Sit to/from stand   Upper Body Dressing : Minimal assistance   Lower Body Dressing: Minimal assistance;Sit to/from stand   Toilet Transfer: Minimal assistance;Stand-pivot  Toileting- Clothing Manipulation and Hygiene: Minimal assistance;Sit to/from stand         General ADL Comments: Hx of 6 falls in one month     Vision  Baseline Vision/History: 1 Wears glasses Ability to See in Adequate Light: 0 Adequate Patient Visual Report: No change from baseline Vision Assessment?: No apparent visual deficits     Perception     Praxis      Pertinent Vitals/Pain Pain Assessment Pain Assessment: No/denies pain Pain Intervention(s): Monitored during session     Hand Dominance Right   Extremity/Trunk Assessment Upper Extremity Assessment Upper Extremity Assessment: Generalized weakness   Lower Extremity Assessment Lower Extremity Assessment: Defer to PT evaluation   Cervical / Trunk Assessment Cervical / Trunk Assessment: Kyphotic   Communication Communication Communication: No difficulties   Cognition Arousal/Alertness: Awake/alert Behavior During Therapy: WFL for tasks assessed/performed, Flat affect Overall Cognitive Status: Within Functional Limits for tasks assessed                                 General Comments: some decreased insight into deficits (multiple falls, declining need for postacute rehab). Further health literacy inquiry needed as pt not consistently checking blood sugars at home though taking Novolog consistently at home     General Comments       Exercises     Shoulder Instructions      Home Living Family/patient expects to be discharged to:: Private residence Living Arrangements: Alone Available Help at Discharge: Family;Available PRN/intermittently;Friend(s) Type of Home: Apartment Home Access: Stairs to enter Entrance Stairs-Number of Steps: 3 Entrance Stairs-Rails: Right Home Layout: One level     Bathroom Shower/Tub: Teacher, early years/pre: Standard     Home Equipment: Cane - single point;Rollator (4 wheels);Toilet riser;Tub bench;BSC/3in1          Prior Functioning/Environment Prior Level of Function : Independent/Modified Independent;Driving             Mobility Comments: has been using Rollator more consistently. ADLs  Comments: Reports typically MOD I with ADLs, was driving and managing IADLs in the home. pt's friend has been assisting with meals, checking in frequently. pt reports managing her own medicines        OT Problem List: Decreased activity tolerance;Impaired balance (sitting and/or standing);Decreased safety awareness;Decreased knowledge of use of DME or AE;Decreased knowledge of precautions;Cardiopulmonary status limiting activity;Obesity;Increased edema      OT Treatment/Interventions: Self-care/ADL training;Therapeutic exercise;DME and/or AE instruction;Manual therapy;Modalities;Therapeutic activities;Patient/family education;Balance training    OT Goals(Current goals can be found in the care plan section) Acute Rehab OT Goals Patient Stated Goal: go home OT Goal Formulation: With patient Time For Goal Achievement: 07/11/22 Potential to Achieve Goals: Good  OT Frequency: Min 2X/week    Co-evaluation              AM-PAC OT "6 Clicks" Daily Activity     Outcome Measure Help from another person eating meals?: None Help from another person taking care of personal grooming?: A Little Help from another person toileting, which includes using toliet, bedpan, or urinal?: A Little Help from another person bathing (including washing, rinsing, drying)?: A Little Help from another person to put on and taking off regular upper body clothing?: A Little Help from another person to put on and taking off regular lower body clothing?: A Little 6 Click Score: 19   End of Session Equipment Utilized During Treatment: Rolling walker (2  wheels) Nurse Communication: Mobility status  Activity Tolerance: Patient tolerated treatment well Patient left: in bed;with call bell/phone within reach  OT Visit Diagnosis: Unsteadiness on feet (R26.81);Muscle weakness (generalized) (M62.81)                Time: 8250-0370 OT Time Calculation (min): 25 min Charges:  OT General Charges $OT Visit: 1 Visit OT  Evaluation $OT Eval Low Complexity: 1 Low  Malachy Chamber, OTR/L Acute Rehab Services Office: [phone]   Layla Maw 06/27/2022, 11:57 AM

## 2022-06-27 NOTE — ED Notes (Signed)
Called lab regarding adding on labs. Per lab they will add on.

## 2022-06-27 NOTE — Progress Notes (Addendum)
HD#0 Subjective:   Summary: [name] is a 71yo F with  PMH of uncontrolled T2DM, COPD, restrictive lung disease using 2L  qhs, chronic combined systolic and diastolic congestive heart failure s/p ICD, recent admission for sepsis in the setting of RLE Group G strep cellulitis on antibiotics presenting to [facility] via EMS after a fall.  Overnight Events: NAEO.  Patient's states she is doing well today and is ready to go home.  She states that sometimes her falls are due to tripping but sometimes there is no obvious cause.  She denies any prodromal symptoms or palpitation with her falls.  She did not have any symptoms when getting in and out of bed today.  He states that she does not take her insulin unless she has a meal.  Objective:  Vital signs in last 24 hours: Vitals:   06/27/22 0101 06/27/22 0115 06/27/22 0251 06/27/22 0622  BP: 129/63 112/61  121/63  Pulse: 74   79  Resp: _0 Temp:  (!) 96.3 F (35.7 C) (!) 97.2 F (36.2 C) (!) 97.3 F (36.3 C)  TempSrc:    Bladder  SpO2: 100% 100%  100%  Weight:      Height:       Supplemental O2: Room Air SpO2: 100 %   Physical Exam:  Constitutional: well-appearing female sitting in hospital bed, in no acute distress HENT: normocephalic atraumatic, mucous membranes moist Eyes: conjunctiva non-erythematous, left periorbital swelling and ecchymosis Neck: supple Cardiovascular: regular rate and rhythm, no m/r/g Pulmonary/Chest: normal work of breathing on 2L, lungs clear to auscultation bilaterally Abdominal: soft, non-tender, non-distended MSK: 1+ pitting edema to lower extremities bilaterally Neurological: alert & oriented  Skin: warm and dry Psych: Normal mood and affect  Filed Weights   06/26/22 1434  Weight: 94 kg     Intake/Output Summary (Last 24 hours) at 06/27/2022 1339 Last data filed at 06/27/2022 0034 Gross per 24 hour  Intake 583.05 ml  Output --  Net 583.05 ml   Net IO Since Admission: 583.05 mL  [06/27/22 1339]  Pertinent Labs:    Latest Ref Rng & Units 06/27/2022    6:18 AM 06/26/2022    4:07 PM 06/26/2022    2:45 PM  CBC  WBC 4.0 - 10.5 K/uL 7.5   8.2   Hemoglobin 12.0 - 15.0 g/dL 10.2  12.9  11.8   Hematocrit 36.0 - 46.0 % 32.6  38.0  40.1   Platelets 150 - 400 K/uL 396   421        Latest Ref Rng & Units 06/27/2022    6:18 AM 06/26/2022    4:07 PM 06/26/2022    2:45 PM  CMP  Glucose 70 - 99 mg/dL 167  83  93   BUN 8 - 23 mg/dL _1 Creatinine 0.44 - 1.00 mg/dL 1.16  1.20  1.16   Sodium 135 - 145 mmol/L 140  141  140   Potassium 3.5 - 5.1 mmol/L 4.3  4.6  4.4   Chloride 98 - 111 mmol/L 109  106  107   CO2 22 - 32 mmol/L 27   26   Calcium 8.9 - 10.3 mg/dL 8.4   8.8   Total Protein 6.5 - 8.1 g/dL 5.7   7.1   Total Bilirubin 0.3 - 1.2 mg/dL 0.6   0.9   Alkaline Phos 38 - 126 U/L 174   214   AST 15 -  41 U/L 33   66   ALT 0 - 44 U/L 12   15     Imaging: US Abdomen Limited RUQ (LIVER/GB)  Result Date: 06/27/2022 CLINICAL DATA:  Elevated transaminases EXAM: ULTRASOUND ABDOMEN LIMITED RIGHT UPPER QUADRANT COMPARISON:  CT abdomen/pelvis 02/17/2017, right upper quadrant ultrasound 02/05/2016 FINDINGS: Gallbladder: Gallbladder wall is mildly thickened measuring up to 3 mm. There are no shadowing gallstones or pericholecystic fluid. Sonographic Murphy's sign was reported negative. Common bile duct: Diameter: 2 mm Liver: The liver surface contours somewhat nodular with coarsened echotexture. No focal lesions are identified. Portal vein is patent on color Doppler imaging with normal direction of blood flow towards the liver. Other: None. IMPRESSION: 1. Coarsened echotexture of the liver with a nodular surface contour raising suspicion for cirrhosis. 2. Mild nonspecific gallbladder wall thickening without pericholecystic fluid, gallstones, or sonographic Murphy's sign. Electronically Signed   By: Valetta Mole M.D.   On: 06/27/2022 13:09   CT Maxillofacial Wo Contrast  Result  Date: 06/26/2022 CLINICAL DATA:  Blunt facial trauma. EXAM: CT MAXILLOFACIAL WITHOUT CONTRAST TECHNIQUE: Multidetector CT imaging of the maxillofacial structures was performed. Multiplanar CT image reconstructions were also generated. RADIATION DOSE REDUCTION: This exam was performed according to the departmental dose-optimization program which includes automated exposure control, adjustment of the mA and/or kV according to patient size and/or use of iterative reconstruction technique. COMPARISON:  None Available. FINDINGS: Osseous: No fracture or mandibular dislocation. No destructive process. Orbits: Negative. No traumatic or inflammatory finding. Sinuses: Mild mucosal thickening of the ethmoid and maxillary sinuses. Soft tissues: Left periorbital preseptal soft tissue swelling. Limited intracranial: No significant or unexpected finding. IMPRESSION: 1. No evidence of facial fractures. 2. Left periorbital preseptal soft tissue swelling. 3. Mild mucosal thickening of the ethmoid and maxillary sinuses. Electronically Signed   By: Fidela Salisbury M.D.   On: 06/26/2022 16:37   CT Head Wo Contrast  Result Date: 06/26/2022 CLINICAL DATA:  Blunt facial trauma. EXAM: CT HEAD WITHOUT CONTRAST TECHNIQUE: Contiguous axial images were obtained from the base of the skull through the vertex without intravenous contrast. RADIATION DOSE REDUCTION: This exam was performed according to the departmental dose-optimization program which includes automated exposure control, adjustment of the mA and/or kV according to patient size and/or use of iterative reconstruction technique. COMPARISON:  June 17, 2022 FINDINGS: Brain: No evidence of acute infarction, hemorrhage, hydrocephalus, extra-axial collection or mass lesion/mass effect. Vascular: No hyperdense vessel or unexpected calcification. Skull: Normal. Negative for fracture or focal lesion. Sinuses/Orbits: Mild mucosal thickening of the maxillary in ethmoid sinuses. Left  preseptal soft tissue swelling. Other: None. IMPRESSION: 1. No acute intracranial abnormality. 2. Left preseptal soft tissue swelling. Electronically Signed   By: Fidela Salisbury M.D.   On: 06/26/2022 16:34    Assessment/Plan:   Principal Problem:   Hypothermia Active Problems:   Diabetes mellitus due to underlying condition with hypoglycemia [facility])   Patient Summary: [name] is a 71 year old female with past medical history of type 2 diabetes on insulin, HFrEF, restrictive lung disease using 1L supplemental O2 at night, and psoriasis who presented with hypoglycemia, hypothermia and a fall and was admitted for hypothermia.   Hypothermia Prolonged Qtc Patient was found hypothermic to 90.4 F yesterday morning.  She received warmed IV dextrose and bear hugger was put in place.  Temperature now 97.3 F.  TSH 2.68.  Free T4 1.2.  Less concerned for thyroid etiology and feel this most likely to be related to hypoglycemia.  QTc  improved to 517. -Telemetry -Remove Foley temp   DMII Hypoglycemia Last A1c 9.7 earlier this month.  Patient takes to dapagliflozin 10 mg, lispro 6 units 3 times daily, degludec 10 units daily and Victoza.  Likely secondary to taking short acting insulin despite not eating.  Glucose 75 on admission yesterday.  167 this morning.  Recent ACTH stim test within normal limits.  TSH 2.68 on admission.  Insulin regimen decreased during recent admission.  Pancreatic insufficiency could be a contributor though the patient denies diarrhea or abdominal pain. -Hold home insulin, Tresiba, lispro -SSI -cbg qid -Fecal elastase   Recurrent falls Preseptal soft tissue swelling of left eye Daughter states that EMS found her mother with oxygen cord wrapped around her ankles.  This is her sixth fall in the last month.  She denies lightheadedness, dizziness, palpitations, weakness, or tripping on any surfaces.  Denies any prodromal symptoms.  Most recent fall seems mechanical in nature.   However, differential for multiple recent falls includes recurrent hypoglycemia, peripheral neuropathy, claudication. -PT/OT eval and treat -Orthostatics -ABIs ordered -B1, B12   Elevated alkaline phosphatase Transaminitis Alk phos 214, AST 66 on admission.  Currently denies abdominal pain. GGT 48. Unsure of etiology of elevated LFTs but repeat CMP today with AST 33, ALP improved to 174. CT abd 2018 with possible early cirrhotic changes of the liver. Labs with cholestatic pattern.  RUQ [place] 9/25 demonstrates nodular surface of liver suspicious for cirrhosis and mild nonspecific gallbladder wall thickening without stones or pericholecystic fluid.   -Continue to monitor for symptoms   Gamma gap Protein 7.1 with albumin 2.4, gamma gap 4.7. -UPEP, SPEP pending   Normocytic anemia Likely anemia of chronic disease. Hgb 10.2 with MCV 83.6. Sat ratio 28. Ferritin 217. VSS.  -No need for ESA at this time.   History of RLE cellulitis c/p Group G bacteremia Admitted earlier this month for cellulitis and group B strep bacteremia.  Received 7 days of IV antibiotics and continued continues on amoxicillin 3 times daily with last dose scheduled for 9/29. -Continue amoxicillin   HFrEF ICD in place Patient is on Lasix 40 mg twice daily and metoprolol succinate 50 mg daily.  Weight up from 85.7 kg at last discharge to 94 kg on admission.  Entresto and spironolactone held at discharge due to low blood pressure but she is normotensive now.  Face and upper extremities edematous on admission but no crackles on lung exam.  No lower leg edema.  BNP 418 which is elevated from 122 4 months ago. -Restart metoprolol succinate 50 mg daily -Continue Lasix   Restrictive lung disease Chronic hypoxic respiratory failure, uses 1L oxygen at night Tobacco use disorder Patient is on [place] at home.  She has had multiple pulmonology referrals but has yet to be seen.  Past PFTs indicating restrictive disease and  obstructive disease, FEV1 65% of predicted. Additionally, CT chest in 2020 indicative of small airway disease. -O2 sat goal 88 to 92%   HLD Lipitor 80 mg daily   Thyroid nodule CT cervical spine 9/12 with 15 mm lesion left lateral thyroid and 10 mm lesion right thyroid with recommendations for thyroid ultrasound. -Referral for thyroid US  Diet: Carb-Modified IVF: None, VTE:  Xarelto Code: Full PT/OT recs: SNF for Subacute PT   Dispo: Admit patient to Observation with expected length of stay less than 2 midnights.  Linward Natal MD Internal Medicine Resident PGY-1 Please contact the on call pager after 5 pm and on weekends at  [phone].

## 2022-06-27 NOTE — ED Notes (Signed)
Attempted blood draw; no success.

## 2022-06-27 NOTE — Evaluation (Signed)
Physical Therapy Evaluation Patient Details Name: [name] MRN: [id number] DOB: 25-Aug-1951 Today's Date: 06/27/2022  History of Present Illness  71 yo female admitted after a fall at home. Noted to be hypoglycemic (BG 38) and hypothermic. Recently admitted for sepsis in setting of cellulitis. PMH: ICD, DM2, restrictive lung disease, CHF  Clinical Impression  Pt admitted secondary to problem above with deficits below. Pt requiring mod A for bed mobility and min A to stand and transfer to chair for linen change on stretcher. Pt with soft BP following transfer at 70/56, but asymptomatic. Pt with recent fall and feel she will have difficulty caring for herself. Recommending SNF level therapies, however, pt may refuse. If pt refuses, recommending max HH. Will continue to follow acutely.        Recommendations for follow up therapy are one component of a multi-disciplinary discharge planning process, led by the attending physician.  Recommendations may be updated based on patient status, additional functional criteria and insurance authorization.  Follow Up Recommendations Skilled nursing-short term rehab (<3 hours/day) (max HH if pt refuses) Can patient physically be transported by private vehicle: Yes    Assistance Recommended at Discharge Frequent or constant Supervision/Assistance  Patient can return home with the following  A little help with walking and/or transfers;A little help with bathing/dressing/bathroom;Assistance with cooking/housework;Direct supervision/assist for medications management;Help with stairs or ramp for entrance;Assist for transportation    Equipment Recommendations None recommended by PT  Recommendations for Other Services       Functional Status Assessment Patient has had a recent decline in their functional status and demonstrates the ability to make significant improvements in function in a reasonable and predictable amount of time.     Precautions /  Restrictions Precautions Precautions: Fall;Other (comment) Precaution Comments: monitor BP Restrictions Weight Bearing Restrictions: No      Mobility  Bed Mobility Overal bed mobility: Needs Assistance Bed Mobility: Supine to Sit, Sit to Supine     Supine to sit: Mod assist Sit to supine: Mod assist   General bed mobility comments: Mod A to bring LEs off of stretcher with handheld assist to lift trunk. Mod A to get BLE back onto high stretcher    Transfers Overall transfer level: Needs assistance Equipment used: Rolling walker (2 wheels) Transfers: Sit to/from Stand, Bed to chair/wheelchair/BSC Sit to Stand: Min assist, +2 safety/equipment (total A +4 to lift pt back on higher stretcher)   Step pivot transfers: Min assist, +2 safety/equipment       General transfer comment: Min A (+2 for safety d/t high stretcher height) to stand with RW, transfer to recliner for linen change. Required total A +4 to lift back onto stretcher due to short stature and unable to use step stool    Ambulation/Gait                  Stairs            Wheelchair Mobility    Modified Rankin (Stroke Patients Only)       Balance Overall balance assessment: Needs assistance Sitting-balance support: No upper extremity supported, Feet supported Sitting balance-Leahy Scale: Good     Standing balance support: Bilateral upper extremity supported Standing balance-Leahy Scale: Poor                               Pertinent Vitals/Pain Pain Assessment Pain Assessment: No/denies pain    Home Living Family/patient expects to  be discharged to:: Private residence Living Arrangements: Alone Available Help at Discharge: Family;Available PRN/intermittently;Friend(s) Type of Home: Apartment Home Access: Stairs to enter Entrance Stairs-Rails: Right Entrance Stairs-Number of Steps: 3   Home Layout: One level Home Equipment: Cane - single point;Rollator (4 wheels);Toilet  riser;Tub bench;BSC/3in1      Prior Function Prior Level of Function : Independent/Modified Independent;Driving             Mobility Comments: has been using Rollator more consistently. ADLs Comments: Reports typically MOD I with ADLs, was driving and managing IADLs in the home. pt's friend has been assisting with meals, checking in frequently. pt reports managing her own medicines     Hand Dominance   Dominant Hand: Right    Extremity/Trunk Assessment   Upper Extremity Assessment Upper Extremity Assessment: Defer to OT evaluation    Lower Extremity Assessment Lower Extremity Assessment: Generalized weakness    Cervical / Trunk Assessment Cervical / Trunk Assessment: Kyphotic  Communication   Communication: No difficulties  Cognition Arousal/Alertness: Awake/alert Behavior During Therapy: WFL for tasks assessed/performed, Flat affect Overall Cognitive Status: Within Functional Limits for tasks assessed                                 General Comments: some decreased insight into deficits (multiple falls, declining need for postacute rehab). Further health literacy inquiry needed as pt not consistently checking blood sugars at home though taking Novolog consistently at home        General Comments General comments (skin integrity, edema, etc.): BP at 70/56 following transfer back to bed. Pt asymptomatic    Exercises     Assessment/Plan    PT Assessment Patient needs continued PT services  PT Problem List Decreased strength;Decreased mobility;Decreased activity tolerance;Decreased balance;Decreased knowledge of use of DME;Decreased skin integrity;Decreased safety awareness       PT Treatment Interventions Gait training;Functional mobility training;Therapeutic activities;Patient/family education;DME instruction;Therapeutic exercise;Stair training;Balance training    PT Goals (Current goals can be found in the Care Plan section)  Acute Rehab PT  Goals Patient Stated Goal: to go home PT Goal Formulation: With patient Time For Goal Achievement: 07/11/22 Potential to Achieve Goals: Fair    Frequency Min 3X/week     Co-evaluation               AM-PAC PT "6 Clicks" Mobility  Outcome Measure Help needed turning from your back to your side while in a flat bed without using bedrails?: A Lot Help needed moving from lying on your back to sitting on the side of a flat bed without using bedrails?: A Lot Help needed moving to and from a bed to a chair (including a wheelchair)?: A Little Help needed standing up from a chair using your arms (e.g., wheelchair or bedside chair)?: A Little Help needed to walk in hospital room?: A Lot Help needed climbing 3-5 steps with a railing? : A Lot 6 Click Score: 14    End of Session Equipment Utilized During Treatment: Gait belt Activity Tolerance: Patient limited by fatigue Patient left: in bed (on stretcher in hallway in ED) Nurse Communication: Mobility status PT Visit Diagnosis: Unsteadiness on feet (R26.81);Muscle weakness (generalized) (M62.81);History of falling (Z91.81)    Time: 1030-1054 PT Time Calculation (min) (ACUTE ONLY): 24 min   Charges:   PT Evaluation $PT Eval Moderate Complexity: 1 Mod          Reuel Derby, PT, DPT  Acute Rehabilitation Services  Office: [phone]   Rudean Hitt 06/27/2022, 3:11 PM

## 2022-06-27 NOTE — Progress Notes (Signed)
Dear Doctor: This patient has been identified as a candidate for PICC for the following reason (s): poor veins/poor circulatory system (CHF, COPD, emphysema, diabetes, steroid use, IV drug abuse, etc.) If you agree, please write an order for the indicated device.   Thank you for supporting the early vascular access assessment program. 

## 2022-06-28 ENCOUNTER — Observation Stay (HOSPITAL_BASED_OUTPATIENT_CLINIC_OR_DEPARTMENT_OTHER): Payer: Medicare Other

## 2022-06-28 DIAGNOSIS — T68XXXA Hypothermia, initial encounter: Secondary | ICD-10-CM | POA: Diagnosis not present

## 2022-06-28 DIAGNOSIS — R9431 Abnormal electrocardiogram [ECG] [EKG]: Secondary | ICD-10-CM

## 2022-06-28 DIAGNOSIS — R55 Syncope and collapse: Secondary | ICD-10-CM

## 2022-06-28 DIAGNOSIS — R4182 Altered mental status, unspecified: Secondary | ICD-10-CM | POA: Diagnosis not present

## 2022-06-28 DIAGNOSIS — J449 Chronic obstructive pulmonary disease, unspecified: Secondary | ICD-10-CM | POA: Diagnosis not present

## 2022-06-28 DIAGNOSIS — R680 Hypothermia, not associated with low environmental temperature: Secondary | ICD-10-CM | POA: Diagnosis not present

## 2022-06-28 DIAGNOSIS — S0083XA Contusion of other part of head, initial encounter: Secondary | ICD-10-CM | POA: Diagnosis not present

## 2022-06-28 LAB — COMPREHENSIVE METABOLIC PANEL
ALT: 12 U/L (ref 0–44)
AST: 26 U/L (ref 15–41)
Albumin: 1.9 g/dL — ABNORMAL LOW (ref 3.5–5.0)
Alkaline Phosphatase: 162 U/L — ABNORMAL HIGH (ref 38–126)
Anion gap: 3 — ABNORMAL LOW (ref 5–15)
BUN: 18 mg/dL (ref 8–23)
CO2: 27 mmol/L (ref 22–32)
Calcium: 8.4 mg/dL — ABNORMAL LOW (ref 8.9–10.3)
Chloride: 112 mmol/L — ABNORMAL HIGH (ref 98–111)
Creatinine, Ser: 1.01 mg/dL — ABNORMAL HIGH (ref 0.44–1.00)
GFR, Estimated: >60 mL/min (ref >60)
Glucose, Bld: 143 mg/dL — ABNORMAL HIGH (ref 70–99)
Potassium: 4.6 mmol/L (ref 3.5–5.1)
Sodium: 142 mmol/L (ref 135–145)
Total Bilirubin: 0.8 mg/dL (ref 0.3–1.2)
Total Protein: 5.5 g/dL — ABNORMAL LOW (ref 6.5–8.1)

## 2022-06-28 LAB — CBC
HCT: 35.7 % — ABNORMAL LOW (ref 36.0–46.0)
Hemoglobin: 11 g/dL — ABNORMAL LOW (ref 12.0–15.0)
MCH: 25.6 pg — ABNORMAL LOW (ref 26.0–34.0)
MCHC: 30.8 g/dL (ref 30.0–36.0)
MCV: 83 fL (ref 80.0–100.0)
Platelets: 366 10*3/uL (ref 150–400)
RBC: 4.3 MIL/uL (ref 3.87–5.11)
RDW: 16.4 % — ABNORMAL HIGH (ref 11.5–15.5)
WBC: 8.3 10*3/uL (ref 4.0–10.5)
nRBC: 0 % (ref 0.0–0.2)

## 2022-06-28 LAB — HEPATITIS C ANTIBODY: HCV Ab: NONREACTIVE

## 2022-06-28 LAB — GLUCOSE, CAPILLARY
Glucose-Capillary: 71 mg/dL (ref 70–99)
Glucose-Capillary: 87 mg/dL (ref 70–99)
Glucose-Capillary: 140 mg/dL — ABNORMAL HIGH (ref 70–99)

## 2022-06-28 LAB — HEPATITIS B SURFACE ANTIBODY,QUALITATIVE: Hep B S Ab: REACTIVE — AB

## 2022-06-28 MED ORDER — AQUAPHOR EX OINT
TOPICAL_OINTMENT | Freq: Every day | CUTANEOUS | Status: DC
  Filled 2022-06-28: qty 50

## 2022-06-28 MED ORDER — FUROSEMIDE 40 MG PO TABS
40.0000 mg | ORAL_TABLET | Freq: Every day | ORAL | Status: DC
  Administered 2022-06-28: 40 mg via ORAL
  Filled 2022-06-28: qty 1

## 2022-06-28 MED ORDER — METOPROLOL SUCCINATE ER 25 MG PO TB24
25.0000 mg | ORAL_TABLET | Freq: Every day | ORAL | Status: DC
  Administered 2022-06-28 – 2022-06-29 (×2): 25 mg via ORAL
  Filled 2022-06-28 (×2): qty 1

## 2022-06-28 MED ORDER — FUROSEMIDE 40 MG PO TABS
40.0000 mg | ORAL_TABLET | Freq: Two times a day (BID) | ORAL | Status: DC
  Administered 2022-06-28 – 2022-06-29 (×2): 40 mg via ORAL
  Filled 2022-06-28 (×2): qty 1

## 2022-06-28 NOTE — Progress Notes (Signed)
ABI has been completed.   Results can be found under chart review under CV PROC. 06/28/2022 12:13 PM Less Woolsey RVT, RDMS

## 2022-06-28 NOTE — Progress Notes (Incomplete)
HD#0 Subjective:   Summary: [name] is a 71yo F with  PMH of uncontrolled T2DM, COPD, restrictive lung disease using 2L [place] qhs, chronic combined systolic and diastolic congestive heart failure s/p ICD, recent admission for sepsis in the setting of RLE Group G strep cellulitis on antibiotics presenting to [facility] via EMS after a fall.  Overnight Events: NAEO.  Patient states she had a period several decades ago where she did drink a significant amount of alcohol.  She endorses eating 3 meals a day at home with snacks in between.  She fixes some of her meals but goes out to eat also.  She takes her blood sugars in the morning and they are typically under 120, 75 at the lowest.  She states she uses Vaseline at home for her dry skin.  Objective:  Vital signs in last 24 hours: Vitals:   06/28/22 0018 06/28/22 0413 06/28/22 0816 06/28/22 1047  BP: 106/73 (!) 102/54  (!) 123/59  Pulse: 80 75 75 78  Resp: '16 16 18 16  '$ Temp: 97.9 F (36.6 C) 97.6 F (36.4 C)  97.8 F (36.6 C)  TempSrc: Oral Oral  Oral  SpO2: 97% 99% 96% 96%  Weight:      Height:       Supplemental O2: Room Air SpO2: 96 %   Physical Exam:  Constitutional: well-appearing female sitting in hospital bed, in no acute distress, implanted device palpated beneath left upper chest HENT: normocephalic atraumatic, mucous membranes moist, scale at nasolabial folds Eyes: conjunctiva non-erythematous, left periorbital swelling and ecchymosis Neck: supple Cardiovascular: regular rate and rhythm, III/VI systolic murmur over LLSB, negative for JVD Pulmonary/Chest: normal work of breathing on 2L, lower lung fields with faint crackles bilaterally Abdominal: soft, non-tender, non-distended MSK: 1+ pitting edema to lower extremities bilaterally Neurological: alert & oriented, sensation intact to light touch at plantar surface of feet bilaterally, reduced proprioception on localizing test of right second toe Skin: warm and dry,  lichenification notable at lower extremities, chest, back and arms. Psych: Normal mood and affect  Filed Weights   06/26/22 1434 06/27/22 1719  Weight: 94 kg 96.5 kg     Intake/Output Summary (Last 24 hours) at 06/28/2022 1459 Last data filed at 06/28/2022 0800 Gross per 24 hour  Intake 417 ml  Output 1700 ml  Net -1283 ml    Net IO Since Admission: -1,349.95 mL [06/28/22 1459]  Pertinent Labs:    Latest Ref Rng & Units 06/28/2022    3:05 AM 06/27/2022    6:18 AM 06/26/2022    4:07 PM  CBC  WBC 4.0 - 10.5 K/uL 8.3  7.5    Hemoglobin 12.0 - 15.0 g/dL 11.0  10.2  12.9   Hematocrit 36.0 - 46.0 % 35.7  32.6  38.0   Platelets 150 - 400 K/uL 366  396         Latest Ref Rng & Units 06/28/2022    3:05 AM 06/27/2022    6:18 AM 06/26/2022    4:07 PM  CMP  Glucose 70 - 99 mg/dL 143  167  83   BUN 8 - 23 mg/dL '18  21  29   '$ Creatinine 0.44 - 1.00 mg/dL 1.01  1.16  1.20   Sodium 135 - 145 mmol/L 142  140  141   Potassium 3.5 - 5.1 mmol/L 4.6  4.3  4.6   Chloride 98 - 111 mmol/L 112  109  106   CO2 22 - 32  mmol/L 27  27    Calcium 8.9 - 10.3 mg/dL 8.4  8.4    Total Protein 6.5 - 8.1 g/dL 5.5  5.7    Total Bilirubin 0.3 - 1.2 mg/dL 0.8  0.6    Alkaline Phos 38 - 126 U/L 162  174    AST 15 - 41 U/L 26  33    ALT 0 - 44 U/L 12  12      Imaging: VAS [place] ABI WITH/WO TBI  Result Date: 06/28/2022  LOWER EXTREMITY DOPPLER STUDY Patient Name:  [name]  Date of Exam:   06/28/2022 Medical Rec #: [id number]     Accession #:    [id number] Date of Birth: 24-Mar-1951    Patient Gender: F Patient Age:   52 years Exam Location:  [facility] Procedure:      VAS [place] ABI WITH/WO TBI Referring Phys: Velna Ochs --------------------------------------------------------------------------------  Indications: Recurrent falls High Risk Factors: Hypertension, hyperlipidemia, Diabetes, current smoker. Other Factors: CHF, ICD.  Comparison Study: No previous exams Performing Technologist: Hill, Jody  RVT, RDMS  Examination Guidelines: A complete evaluation includes at minimum, Doppler waveform signals and systolic blood pressure reading at the level of bilateral brachial, anterior tibial, and posterior tibial arteries, when vessel segments are accessible. Bilateral testing is considered an integral part of a complete examination. Photoelectric Plethysmograph (PPG) waveforms and toe systolic pressure readings are included as required and additional duplex testing as needed. Limited examinations for reoccurring indications may be performed as noted.  ABI Findings: +---------+------------------+-----+---------+--------+ Right    Rt Pressure (mmHg)IndexWaveform Comment  +---------+------------------+-----+---------+--------+ Brachial 118                    triphasic         +---------+------------------+-----+---------+--------+ PTA      126               1.03 biphasic          +---------+------------------+-----+---------+--------+ DP       153               1.25 triphasic         +---------+------------------+-----+---------+--------+ Great Toe130               1.07 Normal            +---------+------------------+-----+---------+--------+ +---------+------------------+-----+---------+-------+ Left     Lt Pressure (mmHg)IndexWaveform Comment +---------+------------------+-----+---------+-------+ Brachial 122                    triphasic        +---------+------------------+-----+---------+-------+ PTA      142               1.16 biphasic         +---------+------------------+-----+---------+-------+ DP       143               1.17 biphasic         +---------+------------------+-----+---------+-------+ Great Toe144               1.18 Normal           +---------+------------------+-----+---------+-------+ +-------+-----------+-----------+------------+------------+ ABI/TBIToday's ABIToday's TBIPrevious ABIPrevious TBI  +-------+-----------+-----------+------------+------------+ Right  1.25       1.07                                +-------+-----------+-----------+------------+------------+ Left   1.17       1.18                                +-------+-----------+-----------+------------+------------+  Summary: Right: Resting right ankle-brachial index is within normal range. The right toe-brachial index is normal. Left: Resting left ankle-brachial index is within normal range. The left toe-brachial index is normal. *See table(s) above for measurements and observations.  Electronically signed by Deitra Mayo MD on 06/28/2022 at 1:05:42 PM.    Final     Assessment/Plan:   Principal Problem:   Hypothermia Active Problems:   Diabetes mellitus due to underlying condition with hypoglycemia [facility])   Patient Summary: Ms. Jain is a 71 year old female with past medical history of type 2 diabetes on insulin, HFrEF, restrictive lung disease using 1L supplemental O2 at night, and psoriasis who presented with hypoglycemia, hypothermia and a fall and was admitted for hypothermia.   Hypothermia Prolonged Qtc Likely a result of hypoglycemia. Resolved. -Foley temp removed -Telemetry discontinued   DMII Hypoglycemia Last A1c 9.7 earlier this month.  Patient takes empagliflozin 10 mg, lispro 6 units 3 times daily, degludec 10 units daily and Victoza at home.  Patient notable for hypoglycemia by EMS.  Insulin regimen decreased at last admission.  She has not had any insulin today and her sugars are well controlled.  Will watch glucose today to determine likely decreased insulin requirement and change home medications at discharge accordingly. -Hold home insulin, Tresiba, lispro -SSI -cbg qid -Fecal elastase pending   Recurrent falls Preseptal soft tissue swelling of left eye Differential for multiple recent falls includes recurrent hypoglycemia, peripheral neuropathy, claudication. ABIs negative. B12 wnl.  Evidence of decreased proprioception on localizing test of right second toe. This could be contributing to her imbalance. -PT/OT eval and treat -Orthostatics -B1 pending   Elevated alkaline phosphatase Transaminitis Gamma gap ALP improved to 162.  AST, ALT within normal limits.  Gamma Gap 3.6.  RUQ [place] 9/25 with nodular surface of liver suspicious for cirrhosis and mild nonspecific gallbladder wall thickening without stones or pericholecystic fluid.  Given low albumin, will order INR to assess synthetic function.  LFTs improving.  Asymptomatic.  Patient endorses history of alcohol abuse which could be the cause for her cirrhosis. -Continue to monitor for symptoms -UPEP, SPEP pending -PT/INR   Normocytic anemia Likely anemia of chronic disease. Hgb improved to 11. -No need for ESA at this time.   History of RLE cellulitis c/p Group G bacteremia Admitted earlier this month for cellulitis and group B strep bacteremia.  Received 7 days of IV antibiotics and continued continues on amoxicillin 3 times daily with last dose scheduled for 9/29. -Continue amoxicillin   HFrEF ICD in place Patient is on Lasix 40 mg twice daily and metoprolol succinate 50 mg daily.  Weight stable.  2.35 L urine out.  1+ pitting edema of lower extremities bilaterally.  Faint crackles in lower lung fields bilaterally.  Patient denies shortness of breath but does feel that her legs are more swollen than usual.  Distal pulses intact, extremities warm and well-perfused. -Continue metoprolol succinate 50 mg daily -Continue Lasix 40 mg twice daily   Restrictive lung disease Chronic hypoxic respiratory failure, uses 1L oxygen at night Tobacco use disorder Patient is on Anoro Ellipta at home.  She has had multiple pulmonology referrals but has not yet been seen.  Past PFTs indicating restrictive and obstructive disease, FEV1 65% of predicted.  Additionally, CT chest in 2020 indicative of small airway disease. -O2 sat goal 88  to 92%   HLD Lipitor 80 mg daily   Thyroid nodule CT cervical spine 9/12 with 15 mm lesion left lateral thyroid  and 10 mm lesion right thyroid with recommendations for thyroid ultrasound. -Referral for thyroid US  Diet: Carb-Modified IVF: None, VTE:  Xarelto Code: Full PT/OT recs: SNF for Subacute PT   Dispo: Admit patient to Observation with expected length of stay less than 2 midnights.  Linward Natal MD Internal Medicine Resident PGY-1 Please contact the on call pager after 5 pm and on weekends at [phone].

## 2022-06-28 NOTE — Care Management Obs Status (Signed)
[place] NOTIFICATION   Patient Details  Name: [name] MRN: [id number] Date of Birth: Jan 22, 1951   Medicare Observation Status Notification Given:  Yes    Tom-Johnson, Renea Ee, RN 06/28/2022, 10:12 AM

## 2022-06-28 NOTE — TOC Initial Note (Signed)
Transition of Care [facility]) - Initial/Assessment Note    Patient Details  Name: [name] MRN: [id number] Date of Birth: 1951/03/11  Transition [facility]) CM/SW Contact:    Milinda Antis, [place] Phone Number: 06/28/2022, 11:23 AM  Clinical Narrative:                 CSW received consult for possible SNF placement at time of discharge. CSW spoke with patient at bedside.  Patient expressed understanding of PT recommendation and does not want to go to a SNF.  The patient is agreeable to receiving HH.  Patient did not have a preference.   RNCM informed of the above information.      Expected Discharge Plan: [place] Barriers to Discharge: Continued Medical Work up   Patient Goals and CMS Choice Patient states their goals for this hospitalization and ongoing recovery are:: To go home CMS Medicare.gov Compare Post Acute Care list provided to:: Patient Choice offered to / list presented to : Patient  Expected Discharge Plan and Services Expected Discharge Plan: [place]       Living arrangements for the past 2 months: Apartment                                      Prior Living Arrangements/Services Living arrangements for the past 2 months: Apartment Lives with:: Self Patient language and need for interpreter reviewed:: Yes        Need for Family Participation in Patient Care: Yes (Comment) Care giver support system in place?: Yes (comment)   Criminal Activity/Legal Involvement Pertinent to Current Situation/Hospitalization: No - Comment as needed  Activities of Daily Living Home Assistive Devices/Equipment: None ADL Screening (condition at time of admission) Patient's cognitive ability adequate to safely complete daily activities?: Yes Is the patient deaf or have difficulty hearing?: No Does the patient have difficulty seeing, even when wearing glasses/contacts?: No Does the patient have difficulty concentrating,  remembering, or making decisions?: No Patient able to express need for assistance with ADLs?: No Does the patient have difficulty dressing or bathing?: No Independently performs ADLs?: Yes (appropriate for developmental age) Does the patient have difficulty walking or climbing stairs?: Yes Weakness of Legs: Both Weakness of Arms/Hands: None  Permission Sought/Granted   Permission granted to share information with : Yes, Verbal Permission Granted     Permission granted to share info w AGENCY: HH        Emotional Assessment Appearance:: Appears stated age Attitude/Demeanor/Rapport: Engaged Affect (typically observed): Appropriate Orientation: : Oriented to Self, Oriented to Place, Oriented to  Time, Oriented to Situation Alcohol / Substance Use: Not Applicable Psych Involvement: No (comment)  Admission diagnosis:  Hypoglycemia [E16.2] Hypothermia, initial encounter [T68.XXXA] Contusion of face, initial encounter [S00.83XA] Diabetes mellitus due to underlying condition with hypoglycemia ([place]) [E08.649] Altered mental status, unspecified altered mental status type [R41.82] Patient Active Problem List   Diagnosis Date Noted   Diabetes mellitus due to underlying condition with hypoglycemia ([place]) 06/26/2022   Hypothermia 06/26/2022   Pressure injury of skin 06/19/2022   Sepsis due to cellulitis ([place]) 06/17/2022   Sepsis ([place]) 06/17/2022   'light-for-dates' infant with signs of fetal malnutrition 02/15/2022   CHF exacerbation ([place]) 02/09/2022   Hepatic steatosis 07/28/2020   Hyperlipidemia    Osteoporosis 02/12/2020   Healthcare maintenance 12/26/2019   Intrinsic atopic dermatitis 09/19/2019   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   Chronic combined systolic and diastolic congestive heart failure ([place]) 03/18/2016   Spinal stenosis of lumbar region 12/09/2015   Restrictive lung disease 06/24/2015   Diabetic gastroparesis associated with type 2 diabetes mellitus ([place])  07/12/2014   Depression 05/22/2014   Uncontrolled type 2 diabetes mellitus with hyperglycemia ([place]) 04/03/2012   Tobacco use disorder 10/26/2009   Essential hypertension 05/25/2007   ICD (implantable cardioverter-defibrillator) in place 05/04/2007   PCP:  Velna Ochs, MD Pharmacy:   [facility] [place] [address] [facility] [place] [address] Phone: [phone] Fax: [phone]  [facility] [address] [place] [address] Phone: [phone] Fax: [phone]     Social Determinants of Health (SDOH) Interventions    Readmission Risk Interventions    06/22/2022   11:43 AM  Readmission Risk Prevention Plan  Transportation Screening Complete  PCP or Specialist Appt within 5-7 Days Complete  Home Care Screening Complete  Medication Review (RN CM) Referral to Pharmacy

## 2022-06-29 DIAGNOSIS — R9431 Abnormal electrocardiogram [ECG] [EKG]: Secondary | ICD-10-CM | POA: Diagnosis not present

## 2022-06-29 DIAGNOSIS — Z794 Long term (current) use of insulin: Secondary | ICD-10-CM | POA: Diagnosis not present

## 2022-06-29 DIAGNOSIS — T68XXXA Hypothermia, initial encounter: Secondary | ICD-10-CM | POA: Diagnosis not present

## 2022-06-29 DIAGNOSIS — E11649 Type 2 diabetes mellitus with hypoglycemia without coma: Secondary | ICD-10-CM | POA: Diagnosis not present

## 2022-06-29 DIAGNOSIS — R680 Hypothermia, not associated with low environmental temperature: Secondary | ICD-10-CM | POA: Diagnosis not present

## 2022-06-29 LAB — COMPREHENSIVE METABOLIC PANEL
ALT: 12 U/L (ref 0–44)
AST: 20 U/L (ref 15–41)
Albumin: 2 g/dL — ABNORMAL LOW (ref 3.5–5.0)
Alkaline Phosphatase: 143 U/L — ABNORMAL HIGH (ref 38–126)
Anion gap: 8 (ref 5–15)
BUN: 16 mg/dL (ref 8–23)
CO2: 26 mmol/L (ref 22–32)
Calcium: 8.5 mg/dL — ABNORMAL LOW (ref 8.9–10.3)
Chloride: 109 mmol/L (ref 98–111)
Creatinine, Ser: 1.02 mg/dL — ABNORMAL HIGH (ref 0.44–1.00)
GFR, Estimated: 60 mL/min — ABNORMAL LOW (ref >60)
Glucose, Bld: 104 mg/dL — ABNORMAL HIGH (ref 70–99)
Potassium: 4.4 mmol/L (ref 3.5–5.1)
Sodium: 143 mmol/L (ref 135–145)
Total Bilirubin: 0.9 mg/dL (ref 0.3–1.2)
Total Protein: 5.7 g/dL — ABNORMAL LOW (ref 6.5–8.1)

## 2022-06-29 LAB — GLUCOSE, CAPILLARY
Glucose-Capillary: 113 mg/dL — ABNORMAL HIGH (ref 70–99)
Glucose-Capillary: 126 mg/dL — ABNORMAL HIGH (ref 70–99)
Glucose-Capillary: 146 mg/dL — ABNORMAL HIGH (ref 70–99)

## 2022-06-29 LAB — CBC
HCT: 34 % — ABNORMAL LOW (ref 36.0–46.0)
Hemoglobin: 10.8 g/dL — ABNORMAL LOW (ref 12.0–15.0)
MCH: 26.1 pg (ref 26.0–34.0)
MCHC: 31.8 g/dL (ref 30.0–36.0)
MCV: 82.1 fL (ref 80.0–100.0)
Platelets: 396 10*3/uL (ref 150–400)
RBC: 4.14 MIL/uL (ref 3.87–5.11)
RDW: 16.4 % — ABNORMAL HIGH (ref 11.5–15.5)
WBC: 7.5 10*3/uL (ref 4.0–10.5)
nRBC: 0 % (ref 0.0–0.2)

## 2022-06-29 LAB — PROTIME-INR
INR: 2.7 — ABNORMAL HIGH (ref 0.8–1.2)
Prothrombin Time: 28.1 seconds — ABNORMAL HIGH (ref 11.4–15.2)

## 2022-06-29 MED ORDER — AQUAPHOR EX OINT: TOPICAL_OINTMENT | Freq: Every day | CUTANEOUS | 0 refills | Status: DC

## 2022-06-29 MED ORDER — TRIAMCINOLONE 0.1 % CREAM:EUCERIN CREAM 1:1
TOPICAL_CREAM | Freq: Two times a day (BID) | CUTANEOUS | Status: DC
  Filled 2022-06-29: qty 1

## 2022-06-29 NOTE — TOC Progression Note (Signed)
Transition of Care [facility]) - Progression Note    Patient Details  Name: [name] MRN: [id number] Date of Birth: 1951/07/09  Transition [facility]) CM/SW Contact  Tom-Johnson, Renea Ee, RN Phone Number: 06/29/2022, 11:25 AM  Clinical Narrative:     CM notified by CSW that patient declined SNF, requesting to go home with home care. CM spoke with patient at bedside and she voiced her request of going home. Home health was referred to [facility] on her last admission.  CM called and spoke with Delsa Sale about resumption of care with acceptance voiced. Info on AVS.  Patient is admitted for Hypothermia and Hypoglycemia after a fall. Patient has been having frequent falls at home. States she takes her medications as prescribed. Lives alone, has three supportive children. Friends and family transports to and from appointments. Has a cane, rollator and shower seat and home O2 PRN at home. PCP is  Velna Ochs, MD and uses [facility] on [address]. Family to transport at discharge. CM will continue to follow as patient progresses with care towards discharge.  Expected Discharge Plan: [place] Barriers to Discharge: Continued Medical Work up  Expected Discharge Plan and Services Expected Discharge Plan: [place]   Discharge Planning Services: CM Consult   Living arrangements for the past 2 months: Apartment                                       Social Determinants of Health (SDOH) Interventions    Readmission Risk Interventions    06/22/2022   11:43 AM  Readmission Risk Prevention Plan  Transportation Screening Complete  PCP or Specialist Appt within 5-7 Days Complete  Home Care Screening Complete  Medication Review (RN CM) Referral to Pharmacy

## 2022-06-29 NOTE — Discharge Instructions (Signed)
Mrs. Scheib,  It was a pleasure caring for you during your stay here at [facility].  You came in with a low temperature and low blood sugar.  Call your low temperature has resolved and your blood sugar is now better controlled.  Please follow up with your primary care physician Bhupinder Multani next Tuesday, October 2 at our internal medicine clinic. -Stop taking your insulin and Victoza.  Continue taking your [place].

## 2022-06-29 NOTE — TOC Transition Note (Signed)
Transition of Care [facility]) - CM/SW Discharge Note   Patient Details  Name: PANSIE GUGGISBERG MRN: [id number] Date of Birth: 1951-01-21  Transition of Care [facility]) CM/SW Contact:  Tom-Johnson, Renea Ee, RN Phone Number: 06/29/2022, 4:22 PM   Clinical Narrative:     Patient is scheduled for discharge today. [place] info on AVS. Family to transport at discharge. No further TOC needs noted.  Final next level of care: [place] Barriers to Discharge: Barriers Resolved   Patient Goals and CMS Choice Patient states their goals for this hospitalization and ongoing recovery are:: To go home CMS Medicare.gov Compare Post Acute Care list provided to:: Patient Choice offered to / list presented to : Patient  Discharge Placement                Patient to be transferred to facility by: Family      Discharge Plan and Services   Discharge Planning Services: CM Consult                      HH Arranged: PT, OT, Disease Management, RN, Nurse's Aide          Social Determinants of Health (SDOH) Interventions     Readmission Risk Interventions    06/22/2022   11:43 AM  Readmission Risk Prevention Plan  Transportation Screening Complete  PCP or Specialist Appt within 5-7 Days Complete  Home Care Screening Complete  Medication Review (RN CM) Referral to Pharmacy

## 2022-06-29 NOTE — Discharge Summary (Addendum)
Name: [name] MRN: [id number] DOB: 1950-10-04 71 y.o. PCP: Velna Ochs, MD  Date of Admission: 06/26/2022  1:50 PM Date of Discharge: 06/29/2022 Attending Physician: Angelica Pou, MD  Discharge Diagnosis: 1. Principal Problem:   Hypothermia due to hypoglycemia Active Problems:   Diabetes mellitus due to underlying condition with hypoglycemia ([place])   Eczema    Discharge Medications: Allergies as of 06/29/2022       Reactions   Actos [pioglitazone] Other (See Comments)   congestive heart failure   Naproxen Other (See Comments)   '500mg'$  dose made her sleep for two days   Rosiglitazone Hives   Tomato Itching   Hydrocodone-acetaminophen Itching   Hydrocortisone Hives, Itching        Medication List     STOP taking these medications    diphenhydramine-acetaminophen 25-500 MG Tabs tablet Commonly known as: TYLENOL PM   HumaLOG Junior KwikPen 100 UNIT/ML Generic drug: Insulin lispro   Tresiba FlexTouch 100 UNIT/ML FlexTouch Pen Generic drug: insulin degludec   Victoza 18 MG/3ML Sopn Generic drug: liraglutide       TAKE these medications    albuterol 108 (90 Base) MCG/ACT inhaler Commonly known as: VENTOLIN HFA Inhale 2 puffs into the lungs every 6 (six) hours as needed for wheezing or shortness of breath.   amoxicillin 500 MG capsule Commonly known as: AMOXIL Take 2 capsules (1,000 mg total) by mouth every 8 (eight) hours.   Anoro Ellipta 62.5-25 MCG/ACT Aepb Generic drug: umeclidinium-vilanterol Inhale 1 puff into the lungs daily.   aspirin EC 81 MG tablet Take 81 mg by mouth daily. Swallow whole.   atorvastatin 80 MG tablet Commonly known as: LIPITOR Take 1 tablet (80 mg total) by mouth at bedtime.   calcium-vitamin D 500-200 MG-UNIT tablet Commonly known as: OSCAL WITH D Take 1 tablet by mouth 2 (two) times daily.   dapagliflozin propanediol 10 MG Tabs tablet Commonly known as: Farxiga Take 1 tablet (10 mg total) by mouth  daily.   dupilumab 200 MG/1.14ML prefilled syringe Commonly known as: DUPIXENT Inject 200 mg into the skin every 14 (fourteen) days. Every other Friday   furosemide 40 MG tablet Commonly known as: LASIX Take 1 tablet (40 mg total) by mouth 2 (two) times daily.   metoprolol succinate 50 MG 24 hr tablet Commonly known as: TOPROL-XL TAKE 1 TABLET(50 MG) BY MOUTH DAILY WITH OR IMMEDIATELY FOLLOWING A MEAL What changed:  how much to take how to take this when to take this additional instructions   mineral oil-hydrophilic petrolatum ointment Apply topically daily. Apply to entire body daily after bath Start taking on: June 30, 2022               Discharge Care Instructions  (From admission, onward)           Start     Ordered   06/29/22 0000  Discharge wound care:       Comments: Keep wound clean with soap and water, then dry and keep dry after, alternate the sides you lay on to prevent worsening of pressure ulcer   06/29/22 1424            Disposition and follow-up:   [name] was discharged from [facility] in Stable condition.  At the hospital follow up visit please address:  1.  T2DM: Given recent hypoglycemia and relatively well controlled glucose while admitted, will discharge home off Victoza and insulin. Will continue [place] alone. F/u with  blood glucose (A1c earlier this month).  Falls: Etiology remains unclear though hypoglycemia likely contributing. Able to ambulate safely with walker today. Impaired position sense on exam. Please assess vibratory sense at follow up as well. F/u to ensure left periorbital swelling improves and that she is not still falling after discharge.   Mild LFT elevation: New diagnosis of cirrhosis with elevated PT/INR and low albumin. RUQ [place] with findings consistent with cirrhosis. Remote history of EtOH abuse. Asymptomatic. Fib score 0.99. F/u with CMP and repeat INR.   Thyroid nodule: CT cervical  spine 9/12 with 15 mm lesion left lateral thyroid and 10 mm lesion right thyroid with recommendations for thyroid ultrasound. Please refer for thyroid US.  Atopic dermatitis: Provided Aquaphor and vaseline for dry skin. Patient will also be discharged with triamcinolone.   2.  Labs / imaging needed at time of follow-up: CMP, PT/INR  3.  Pending labs/ test needing follow-up: elastase   Follow-up Appointments:  [facility].   Specialty: Home Health Services Why: Someone will call you for resumption of care. Contact information: [place] [phone]                 Hospital Course by problem list: 1. Hypothermia 2/2 hypoglycemia: patient presented with hypothermia thought to be secondary to hypoglycemia.  Her hypothermia resolved with warm dextrose and bear huggers.  Thyroid labs unremarkable.  DMII: Patient initially hypoglycemic but glucose improved with IV dextrose.  There was some concern she had been taking her short acting insulin without eating, though she feels confident that this is not the case.  Subsequent glucose checks throughout her hospitalization were largely well controlled with minimal insulin.  Given recent history of falls and hypoglycemic episodes, will discharge with Farxiga alone and close follow-up.   Hx of falls: Patient with history of at least 6 falls in the last month.  Unsure of cause for these however hypoglycemic episodes are possible contributor. ABIs negative, orthostatics negative. Romberg negative. Notable for decreased proprioception in lower extremities.  Sensation intact of plantar surface of feet bilaterally.  Patient able to ambulate safely upon discharge.  Mild transaminitis: Patient with mild transaminitis.  AST, ALT now within normal limits with ALP improved to 143.  RUQ [place] suggestive of cirrhosis.  INR elevated to 2.7.  Albumin 1.9.  This is a  new diagnosis of cirrhosis. Fib score 0.99.  Patient remains asymptomatic.  Denies abdominal pain.    Discharge Exam:   BP 130/62 (BP Location: Left Arm)   Pulse 80   Temp 98.4 F (36.9 C) (Oral)   Resp 18   Ht '5\' 2"'$  (1.575 m)   Wt 96.5 kg   SpO2 98%   BMI 38.91 kg/m  Discharge exam:  Physical Exam Constitutional:      General: She is not in acute distress. HENT:     Head: Normocephalic and atraumatic.  Eyes:     Extraocular Movements: Extraocular movements intact.  Pulmonary:     Effort: Pulmonary effort is normal.  Skin:    General: Skin is warm and dry.     Comments: Dry, flaky skin and excoriations at legs, arms, back.  Neurological:     Mental Status: She is alert.     Comments: Passed Mini-cog assessment, recalling 3 words after correctly drawing clock w/ specified time.  Romberg negative.  Psychiatric:  Mood and Affect: Mood normal.    Discharge subjective: Patient states she is ready to leave today.  She states she has an inspection that she needs to prepare for it so she is not affected.  She denies any chest pain, shortness of breath, or difficulty with walking.  She reports that she drives to the pharmacy to pick up her medications and manages her medications at home, though she does not use an Environmental education officer.  She endorses 3 meals a day with snacks in between that she prepares for herself or she goes out to eat.  She understands that we are discontinuing all of her diabetes medications except for Farxiga.  Pertinent Labs, Studies, and Procedures:     Latest Ref Rng & Units 06/29/2022    6:46 AM 06/28/2022    3:05 AM 06/27/2022    6:18 AM  CBC  WBC 4.0 - 10.5 K/uL 7.5  8.3  7.5   Hemoglobin 12.0 - 15.0 g/dL 10.8  11.0  10.2   Hematocrit 36.0 - 46.0 % 34.0  35.7  32.6   Platelets 150 - 400 K/uL 396  366  396       Latest Ref Rng & Units 06/29/2022    6:46 AM 06/28/2022    3:05 AM 06/27/2022    6:18 AM  CMP  Glucose 70 - 99 mg/dL 104  143  167   BUN 8 - 23  mg/dL '16  18  21   '$ Creatinine 0.44 - 1.00 mg/dL 1.02  1.01  1.16   Sodium 135 - 145 mmol/L 143  142  140   Potassium 3.5 - 5.1 mmol/L 4.4  4.6  4.3   Chloride 98 - 111 mmol/L 109  112  109   CO2 22 - 32 mmol/L '26  27  27   '$ Calcium 8.9 - 10.3 mg/dL 8.5  8.4  8.4   Total Protein 6.5 - 8.1 g/dL 5.7  5.5  5.7   Total Bilirubin 0.3 - 1.2 mg/dL 0.9  0.8  0.6   Alkaline Phos 38 - 126 U/L 143  162  174   AST 15 - 41 U/L 20  26  33   ALT 0 - 44 U/L '12  12  12    '$ VAS [place] ABI WITH/WO TBI  Result Date: 06/28/2022  LOWER EXTREMITY DOPPLER STUDY Patient Name:  KYLII ENNIS  Date of Exam:   06/28/2022 Medical Rec #: [id number]     Accession #:    [id number] Date of Birth: 02-05-1951    Patient Gender: F Patient Age:   56 years Exam Location:  [facility] Procedure:      VAS [place] ABI WITH/WO TBI Referring Phys: Velna Ochs --------------------------------------------------------------------------------  Indications: Recurrent falls High Risk Factors: Hypertension, hyperlipidemia, Diabetes, current smoker. Other Factors: CHF, ICD.  Comparison Study: No previous exams Performing Technologist: Hill, Jody RVT, RDMS  Examination Guidelines: A complete evaluation includes at minimum, Doppler waveform signals and systolic blood pressure reading at the level of bilateral brachial, anterior tibial, and posterior tibial arteries, when vessel segments are accessible. Bilateral testing is considered an integral part of a complete examination. Photoelectric Plethysmograph (PPG) waveforms and toe systolic pressure readings are included as required and additional duplex testing as needed. Limited examinations for reoccurring indications may be performed as noted.  ABI Findings: +---------+------------------+-----+---------+--------+ Right    Rt Pressure (mmHg)IndexWaveform Comment  +---------+------------------+-----+---------+--------+ Brachial 118  triphasic          +---------+------------------+-----+---------+--------+ PTA      126               1.03 biphasic          +---------+------------------+-----+---------+--------+ DP       153               1.25 triphasic         +---------+------------------+-----+---------+--------+ Great Toe130               1.07 Normal            +---------+------------------+-----+---------+--------+ +---------+------------------+-----+---------+-------+ Left     Lt Pressure (mmHg)IndexWaveform Comment +---------+------------------+-----+---------+-------+ Brachial 122                    triphasic        +---------+------------------+-----+---------+-------+ PTA      142               1.16 biphasic         +---------+------------------+-----+---------+-------+ DP       143               1.17 biphasic         +---------+------------------+-----+---------+-------+ Great Toe144               1.18 Normal           +---------+------------------+-----+---------+-------+ +-------+-----------+-----------+------------+------------+ ABI/TBIToday's ABIToday's TBIPrevious ABIPrevious TBI +-------+-----------+-----------+------------+------------+ Right  1.25       1.07                                +-------+-----------+-----------+------------+------------+ Left   1.17       1.18                                +-------+-----------+-----------+------------+------------+  Summary: Right: Resting right ankle-brachial index is within normal range. The right toe-brachial index is normal. Left: Resting left ankle-brachial index is within normal range. The left toe-brachial index is normal. *See table(s) above for measurements and observations.  Electronically signed by Deitra Mayo MD on 06/28/2022 at 1:05:42 PM.    Final      Discharge Instructions:   Discharge Instructions      Mrs. Fees,  It was a pleasure caring for you during your stay here at [facility].  You came in with a  low temperature and low blood sugar.  Call your low temperature has resolved and your blood sugar is now better controlled.  Please follow up with your primary care physician Bhupinder Multani next Tuesday, October 2 at our internal medicine clinic. -Stop taking your insulin and Victoza.  Continue taking your [place].         Signed: Linward Natal, MD 06/29/2022, 2:28 PM   Pager: [phone]

## 2022-06-29 NOTE — Plan of Care (Signed)
  Problem: Fluid Volume: Goal: Ability to maintain a balanced intake and output will improve Outcome: Progressing   Problem: Nutritional: Goal: Maintenance of adequate nutrition will improve Outcome: Progressing   Problem: Skin Integrity: Goal: Risk for impaired skin integrity will decrease Outcome: Progressing   Problem: Tissue Perfusion: Goal: Adequacy of tissue perfusion will improve Outcome: Progressing

## 2022-06-29 NOTE — Progress Notes (Signed)
DISCHARGE NOTE HOME ABRIANNA SIDMAN to be discharged Home per MD order. Discussed prescriptions and follow up appointments with the patient. Prescriptions given to patient; medication list explained in detail. Patient verbalized understanding.  Skin clean, dry and intact without evidence of skin break down, no evidence of skin tears noted. IV catheter discontinued intact. Site without signs and symptoms of complications. Dressing and pressure applied. Pt denies pain at the site currently. No complaints noted.  Patient free of lines, drains, and wounds.   An After Visit Summary (AVS) was printed and given to the patient. Patient escorted via wheelchair, and discharged home via private auto.  Vira Agar, RN

## 2022-06-29 NOTE — Progress Notes (Signed)
PT Cancellation Note  Patient Details Name: [name] MRN: [id number] DOB: 1950-11-08   Cancelled Treatment:    Reason Eval/Treat Not Completed: Other (comment)  Patient up in chair and reports she is discharging home soon. States she does not want to walk before she goes home.    [place]  Office [phone]   Rexanne Mano 06/29/2022, 12:48 PM

## 2022-06-29 NOTE — Plan of Care (Signed)
  Problem: Education: Goal: Ability to describe self-care measures that may prevent or decrease complications (Diabetes Survival Skills Education) will improve Outcome: Completed/Met Goal: Individualized Educational Video(s) Outcome: Completed/Met   Problem: Coping: Goal: Ability to adjust to condition or change in health will improve Outcome: Completed/Met   Problem: Fluid Volume: Goal: Ability to maintain a balanced intake and output will improve 06/29/2022 1514 by Vira Agar, RN Outcome: Completed/Met 06/29/2022 1258 by Vira Agar, RN Outcome: Progressing   Problem: Health Behavior/Discharge Planning: Goal: Ability to identify and utilize available resources and services will improve Outcome: Completed/Met Goal: Ability to manage health-related needs will improve Outcome: Completed/Met   Problem: Metabolic: Goal: Ability to maintain appropriate glucose levels will improve Outcome: Completed/Met   Problem: Nutritional: Goal: Maintenance of adequate nutrition will improve 06/29/2022 1514 by Vira Agar, RN Outcome: Completed/Met 06/29/2022 1258 by Vira Agar, RN Outcome: Progressing Goal: Progress toward achieving an optimal weight will improve Outcome: Completed/Met   Problem: Skin Integrity: Goal: Risk for impaired skin integrity will decrease 06/29/2022 1514 by Vira Agar, RN Outcome: Completed/Met 06/29/2022 1258 by Vira Agar, RN Outcome: Progressing   Problem: Tissue Perfusion: Goal: Adequacy of tissue perfusion will improve 06/29/2022 1514 by Vira Agar, RN Outcome: Completed/Met 06/29/2022 1258 by Vira Agar, RN Outcome: Progressing

## 2022-06-30 ENCOUNTER — Telehealth: Payer: Self-pay

## 2022-06-30 LAB — VITAMIN B1: Vitamin B1 (Thiamine): 111.7 nmol/L (ref 66.5–200.0)

## 2022-06-30 LAB — PROTEIN ELECTROPHORESIS, SERUM
A/G Ratio: 0.7 (ref 0.7–1.7)
Albumin ELP: 2.1 g/dL — ABNORMAL LOW (ref 2.9–4.4)
Alpha-1-Globulin: 0.3 g/dL (ref 0.0–0.4)
Alpha-2-Globulin: 0.5 g/dL (ref 0.4–1.0)
Beta Globulin: 0.7 g/dL (ref 0.7–1.3)
Gamma Globulin: 1.6 g/dL (ref 0.4–1.8)
Globulin, Total: 3.2 g/dL (ref 2.2–3.9)
Total Protein ELP: 5.3 g/dL — ABNORMAL LOW (ref 6.0–8.5)

## 2022-06-30 NOTE — Patient Outreach (Signed)
  Care Coordination   Transition of Care  Visit Note   06/30/2022 Name: [name] MRN: [id number] DOB: 1951/05/24  Attempted to do transition of care call with patient.  She noted she is at the drugstore, requested a call back tomorrow, 07/01/22.  Johnney Killian, RN, BSN, CCM Care Management Coordinator [place] Phone: [phone]: [phone]

## 2022-07-01 ENCOUNTER — Telehealth: Payer: Self-pay

## 2022-07-01 LAB — CULTURE, BLOOD (ROUTINE X 2)
Culture: NO GROWTH
Culture: NO GROWTH
Special Requests: ADEQUATE

## 2022-07-01 NOTE — Patient Outreach (Signed)
  Care Coordination [facility] Note Transition Care Management Unsuccessful Follow-up Telephone Call  Date of discharge and from where:  [facility] 06/28/22-06/29/22  Attempts:  2nd Attempt  Reason for unsuccessful TCM follow-up call:  No answer/busy  Johnney Killian, RN, BSN, CCM Care Management Coordinator [facility] Phone: [phone]: [phone]

## 2022-07-04 ENCOUNTER — Ambulatory Visit (INDEPENDENT_AMBULATORY_CARE_PROVIDER_SITE_OTHER): Payer: Medicare Other | Admitting: Internal Medicine

## 2022-07-04 ENCOUNTER — Encounter: Payer: Self-pay | Admitting: Internal Medicine

## 2022-07-04 ENCOUNTER — Telehealth: Payer: Self-pay

## 2022-07-04 VITALS — BP 132/89 | HR 115 | Temp 97.6°F | Ht 62.0 in | Wt 209.0 lb

## 2022-07-04 DIAGNOSIS — K760 Fatty (change of) liver, not elsewhere classified: Secondary | ICD-10-CM | POA: Diagnosis not present

## 2022-07-04 DIAGNOSIS — R296 Repeated falls: Secondary | ICD-10-CM | POA: Diagnosis not present

## 2022-07-04 DIAGNOSIS — I10 Essential (primary) hypertension: Secondary | ICD-10-CM | POA: Diagnosis not present

## 2022-07-04 DIAGNOSIS — I5042 Chronic combined systolic (congestive) and diastolic (congestive) heart failure: Secondary | ICD-10-CM

## 2022-07-04 DIAGNOSIS — F17210 Nicotine dependence, cigarettes, uncomplicated: Secondary | ICD-10-CM

## 2022-07-04 DIAGNOSIS — I110 Hypertensive heart disease with heart failure: Secondary | ICD-10-CM

## 2022-07-04 DIAGNOSIS — E042 Nontoxic multinodular goiter: Secondary | ICD-10-CM

## 2022-07-04 DIAGNOSIS — E1165 Type 2 diabetes mellitus with hyperglycemia: Secondary | ICD-10-CM

## 2022-07-04 NOTE — Assessment & Plan Note (Signed)
Patient newly diagnosed with cirrhosis during last hospital admission. At that time there were concerns regarding PT/INR which was elevated to 28.1 and alk phos which was elevated to 143 and the relation to her newly diagnosed cirrhosis.  Plan:Lab unable to collect blood for repeat CMP and PT/INR today despite several attempts. Will have our front desk arrange a lab only visit to recheck these values. I have also placed a referral to GI for newly diagnosed cirrhosis. Fortunately she is already on metoprolol, which is protective if she does have esophageal varices.

## 2022-07-04 NOTE — Patient Instructions (Signed)
Ms. Royse,  It was a pleasure to see you today. I am glad that you are feeling better from your hospital stay after falling.  I have ordered labs to check your kidney and liver function. I have also ordered an ultrasound of your thyroid to better assess nodules that were noted in the hospital.   Because of your falls, I have placed a referral to cardiology. Because of your new diagnosis of cirrhosis of the liver, I have placed a referral to gastroenterology. Please make sure that you make and attend appointments with those specialists!  My best, Dr. Marlou Sa

## 2022-07-04 NOTE — Assessment & Plan Note (Signed)
BP at goal today, 132/89. Current therapy is metoprolol 50 mg daily. Plan:Continue current therapy.

## 2022-07-04 NOTE — Assessment & Plan Note (Signed)
Noted on imaging during recent hospital admission. Plan:Thyroid ultrasound ordered.

## 2022-07-04 NOTE — Assessment & Plan Note (Signed)
Falls preceding recent hospital admission attributed in part to possible hypoglycemia as patient was on insulin therapy in addition to [place]. After discharge, her only medication for diabetes is [place]. She denies recurrent falls since her hospitalization. She does check her blood sugar at home and notes no lows, and that her range is in the 100s-200s. Plan:Continue Farxiga 10 mg daily.

## 2022-07-04 NOTE — Assessment & Plan Note (Signed)
Patient states that her lower extremity edema is improving since her hospital discharge. She has not had difficulty with her breathing and does not appear volume overloaded. She is getting around her home well at this time with assistance of a cane. Weight is down 3 lbs since hospital discharge. Current management is Lasix 40 mg BID, Farxiga 10 mg daily, metoprolol 10 mg daily. Patient endorses adherence to medications. Plan:No change in medical therapy at this time.

## 2022-07-04 NOTE — Patient Outreach (Signed)
  Care Coordination [facility] Note Transition Care Management Unsuccessful Follow-up Telephone Call  Date of discharge and from where:  [facility] 06/28/26-06/29/22  Attempts:  3rd Attempt  Reason for unsuccessful TCM follow-up call:  No answer/busy  Johnney Killian, RN, BSN, CCM Care Management Coordinator [facility] Phone: [phone]: [phone]

## 2022-07-04 NOTE — Assessment & Plan Note (Addendum)
Patient has ICD in place but on chart review it appears that she does not get regular interrogations. On further questioning she states that one of the falls preceding her hospital admission involved loss of consciousness. She also states that though she does not think so, her daughter believes her ICD has been delivering shocks more frequently preceding her last hospitalization. In that time she also states that she was taking metoprolol 50 mg twice daily rather than once as prescribed. She has been due to see a cardiologist however has not yet made an appointment. She did not make it to her last appointment with the advanced HF clinic on July 3. She denies palpitations at this time. She is not dizzy and is ambulating okay. The edema surrounding her left eye is improving and a scabbed wound sustained during one of her falls appears to be healing well without draining or increased warmth or erythema.  Of note, EKG during last hospitalization noted new prolonged QT and low voltage QRS. Assessment:Etiology of falls is felt to be multifactorial: may be related to hypoglycemic episodes given previous medical therapies and falls that did not involve loss of consciousness, however I do also worry that, given her daughter's concerns about increased activity of the ICD, falls due to loss of consciousness, and new prolonged QT on EKG, that she could be having arrhythmias, symptomatic bradycardia, or some other cardiac cause of her fainting and falls. Plan:Referral placed to cardiology. No changes to medications from hospital med rec.

## 2022-07-04 NOTE — Progress Notes (Signed)
CC: hospital follow-up  HPI:  [name] is a 71 y.o. person with past medical history as detailed below who presents today for hospital follow up. Please see problem based charting for detailed assessment and plan.  Past Medical History:  Diagnosis Date   ATN (acute tubular necrosis) ([place]) 07/15/2014   Automatic implantable cardioverter-defibrillator in situ    Automatic implantable cardioverter-defibrillator in situ 05/04/2007   Qualifier: Diagnosis of  By: Isla Pence     Cardiac defibrillator in situ    Atlas II VR (SJM) implanted by Dr Lovena Le   CHF (congestive heart failure) ([place])    Chronic combined systolic and diastolic heart failure ([place])    a. EF 35-40% in past;  b. Echo 7/13:  EF 45-50%, Gr 2 diast dysfn, mild AI, mild MAC, trivial MR, mild LAE, PASP 47.   Chronic ulcer of leg ([place])    04-09-15 resolved-not a problem.   Colon polyps 04/12/2013   Rectosigmoid polyp   COPD (chronic obstructive pulmonary disease) (HCC)    O2 at night   Depression    Diabetes mellitus    Diabetes mellitus, type 2 ([place]) 04/03/2012   HIGH RISK FEET.. Please have patient take shoes and socks off every visit for visual foot inspection.     Eczema    Elevated alkaline phosphatase level    GGT and 5'nucleotidase 8/13 normal   Health care maintenance 01/22/2013   Surgically absent cervix- no pap needed (Path report 07/2000: uterine body with attached bilateral  adnexa and separate cervix.)   History of oxygen administration    oxygen @ 2 l/m nasally bedtime 24/7   HTN (hypertension)    Hx of cardiac cath    a. [place] 2003 normal;  b. LHC 6/13:  Mild calcification in the LM, o/w normal coronary arteries, EF 45%.    Hyperkalemia 08/08/2017   Hyperlipidemia    Elevated triglyceride in 2019   Hyperlipidemia associated with type 2 diabetes mellitus ([place]) 05/25/2007   Qualifier: Diagnosis of  By: Hassell Done FNP, [place]     Hyperthyroidism, subclinical    HYPERTHYROIDISM, SUBCLINICAL 05/06/2009    Qualifier: Diagnosis of  By: Darrick Meigs     Implantable cardioverter-defibrillator (ICD) generator end of life    Lipoma    Need for hepatitis C screening test 04/25/2019   NICM (nonischemic cardiomyopathy) ([place])    Obesity    On home oxygen therapy    "2L; 24/7" (10/11/2016)   Post menopausal syndrome    Shortness of breath 07/14/2017   Skin rash 07/12/2018   Sleep apnea    pt denies 04/12/2013   Review of Systems:  Negative unless otherwise stated.  Physical Exam:  Vitals:   07/04/22 1033 07/04/22 1112  BP: (!) 167/113 132/89  Pulse: 83 (!) 115  Temp: 97.6 F (36.4 C)   TempSrc: Oral   SpO2: 95%   Weight: 209 lb (94.8 kg)   Height: _0  (1.575 m)    Constitutional:Seated in wheelchair. Appears comfortable. In no acute distress. Eyes:Edema surrounding L eye with circular, non draining, non erythematous 1cm x 1cm healing scab over edge of L lateral eye brow. No tenderness to palpation of face. Cardio:Regular rate and rhythm. No murmurs, rubs, or gallops. Pulm:Clear to auscultation bilaterally. Normal work of breathing on room air. SVX:BLTJQZESP LE with nonpitting edema. Distal extremities are warm with normal DP pulses.  Skin:Warm and dry. Neuro:Alert and oriented x3. No focal deficit noted. Psych:Pleasant mood and affect.  Assessment & Plan:  See Encounters Tab for problem based charting.  Chronic combined systolic and diastolic congestive heart failure [facility]) Patient states that her lower extremity edema is improving since her hospital discharge. She has not had difficulty with her breathing and does not appear volume overloaded. She is getting around her home well at this time with assistance of a cane. Weight is down 3 lbs since hospital discharge. Current management is Lasix 40 mg BID, Farxiga 10 mg daily, metoprolol 10 mg daily. Patient endorses adherence to medications. Plan:No change in medical therapy at this time.  Essential hypertension BP at goal today,  132/89. Current therapy is metoprolol 50 mg daily. Plan:Continue current therapy.  Hepatic steatosis Patient newly diagnosed with cirrhosis during last hospital admission. At that time there were concerns regarding PT/INR which was elevated to 28.1 and alk phos which was elevated to 143 and the relation to her newly diagnosed cirrhosis.  Plan:Lab unable to collect blood for repeat CMP and PT/INR today despite several attempts. Will have our front desk arrange a lab only visit to recheck these values. I have also placed a referral to GI for newly diagnosed cirrhosis. Fortunately she is already on metoprolol, which is protective if she does have esophageal varices.  Multiple thyroid nodules Noted on imaging during recent hospital admission. Plan:Thyroid ultrasound ordered.  Uncontrolled type 2 diabetes mellitus with hyperglycemia ([place]) [place] preceding recent hospital admission attributed in part to possible hypoglycemia as patient was on insulin therapy in addition to [place]. After discharge, her only medication for diabetes is [place]. She denies recurrent falls since her hospitalization. She does check her blood sugar at home and notes no lows, and that her range is in the 100s-200s. Plan:Continue Farxiga 10 mg daily.  Multiple falls Patient has ICD in place but on chart review it appears that she does not get regular interrogations. On further questioning she states that one of the falls preceding her hospital admission involved loss of consciousness. She also states that though she does not think so, her daughter believes her ICD has been delivering shocks more frequently preceding her last hospitalization. In that time she also states that she was taking metoprolol 50 mg twice daily rather than once as prescribed. She has been due to see a cardiologist however has not yet made an appointment. She did not make it to her last appointment with the advanced HF clinic on July 3. She denies  palpitations at this time. She is not dizzy and is ambulating okay. The edema surrounding her left eye is improving and a scabbed wound sustained during one of her falls appears to be healing well without draining or increased warmth or erythema.  Of note, EKG during last hospitalization noted new prolonged QT and low voltage QRS. Assessment:Etiology of falls is felt to be multifactorial: may be related to hypoglycemic episodes given previous medical therapies and falls that did not involve loss of consciousness, however I do also worry that, given her daughter's concerns about increased activity of the ICD, falls due to loss of consciousness, and new prolonged QT on EKG, that she could be having arrhythmias, symptomatic bradycardia, or some other cardiac cause of her fainting and falls. Plan:Referral placed to cardiology. No changes to medications from hospital med rec.  Patient discussed with Dr. Dareen Piano

## 2022-07-06 NOTE — Progress Notes (Signed)
Internal Medicine Clinic Attending ? ?Case discussed with Dr. Dean  At the time of the visit.  We reviewed the resident?s history and exam and pertinent patient test results.  I agree with the assessment, diagnosis, and plan of care documented in the resident?s note.  ?

## 2022-07-11 ENCOUNTER — Telehealth: Payer: Self-pay | Admitting: *Deleted

## 2022-07-11 NOTE — Telephone Encounter (Signed)
Received call from [place] with [facility]. States patient reports she fell in shower on 10/6. No LOC; did not hit her head. Requesting VO for [facility] OT for Eval and treat. Verbal auth given. Will route to PCP for agreement/denial.

## 2022-07-11 NOTE — Telephone Encounter (Signed)
I agree with the plan for [facility] OT, thank you.

## 2022-07-12 ENCOUNTER — Telehealth (HOSPITAL_COMMUNITY): Payer: Self-pay

## 2022-07-12 NOTE — Telephone Encounter (Signed)
Called to confirm/remind patient of their appointment at the [facility] on 07/13/22.   Patient reminded to bring all medications and/or complete list.  Confirmed patient has transportation. Gave directions, instructed to utilize [place] parking.  Confirmed appointment prior to ending call.

## 2022-07-13 ENCOUNTER — Encounter (HOSPITAL_COMMUNITY): Payer: Self-pay

## 2022-07-13 ENCOUNTER — Other Ambulatory Visit: Payer: Medicare Other

## 2022-07-13 ENCOUNTER — Ambulatory Visit (HOSPITAL_COMMUNITY)
Admission: RE | Admit: 2022-07-13 | Discharge: 2022-07-13 | Disposition: A | Discharge status: Home / Self Care | Payer: Medicare Other | Source: Ambulatory Visit | Attending: Family Medicine | Admitting: Family Medicine

## 2022-07-13 ENCOUNTER — Ambulatory Visit (INDEPENDENT_AMBULATORY_CARE_PROVIDER_SITE_OTHER): Payer: Medicare Other

## 2022-07-13 VITALS — BP 110/88 | HR 100 | Wt 222.0 lb

## 2022-07-13 DIAGNOSIS — I428 Other cardiomyopathies: Secondary | ICD-10-CM

## 2022-07-13 DIAGNOSIS — Z7984 Long term (current) use of oral hypoglycemic drugs: Secondary | ICD-10-CM | POA: Insufficient documentation

## 2022-07-13 DIAGNOSIS — J9610 Chronic respiratory failure, unspecified whether with hypoxia or hypercapnia: Secondary | ICD-10-CM | POA: Diagnosis not present

## 2022-07-13 DIAGNOSIS — E119 Type 2 diabetes mellitus without complications: Secondary | ICD-10-CM | POA: Insufficient documentation

## 2022-07-13 DIAGNOSIS — Z794 Long term (current) use of insulin: Secondary | ICD-10-CM

## 2022-07-13 DIAGNOSIS — Z79899 Other long term (current) drug therapy: Secondary | ICD-10-CM | POA: Diagnosis not present

## 2022-07-13 DIAGNOSIS — R296 Repeated falls: Secondary | ICD-10-CM | POA: Diagnosis not present

## 2022-07-13 DIAGNOSIS — I110 Hypertensive heart disease with heart failure: Secondary | ICD-10-CM | POA: Insufficient documentation

## 2022-07-13 DIAGNOSIS — J849 Interstitial pulmonary disease, unspecified: Secondary | ICD-10-CM | POA: Insufficient documentation

## 2022-07-13 DIAGNOSIS — Z9981 Dependence on supplemental oxygen: Secondary | ICD-10-CM | POA: Insufficient documentation

## 2022-07-13 DIAGNOSIS — J449 Chronic obstructive pulmonary disease, unspecified: Secondary | ICD-10-CM | POA: Insufficient documentation

## 2022-07-13 DIAGNOSIS — I959 Hypotension, unspecified: Secondary | ICD-10-CM | POA: Diagnosis not present

## 2022-07-13 DIAGNOSIS — F17210 Nicotine dependence, cigarettes, uncomplicated: Secondary | ICD-10-CM | POA: Insufficient documentation

## 2022-07-13 DIAGNOSIS — I5023 Acute on chronic systolic (congestive) heart failure: Secondary | ICD-10-CM | POA: Diagnosis not present

## 2022-07-13 DIAGNOSIS — Z139 Encounter for screening, unspecified: Secondary | ICD-10-CM

## 2022-07-13 DIAGNOSIS — Z720 Tobacco use: Secondary | ICD-10-CM | POA: Diagnosis not present

## 2022-07-13 LAB — COMPREHENSIVE METABOLIC PANEL
ALT: 16 U/L (ref 0–44)
AST: 19 U/L (ref 15–41)
Albumin: 2.5 g/dL — ABNORMAL LOW (ref 3.5–5.0)
Alkaline Phosphatase: 214 U/L — ABNORMAL HIGH (ref 38–126)
Anion gap: 7 (ref 5–15)
BUN: 17 mg/dL (ref 8–23)
CO2: 26 mmol/L (ref 22–32)
Calcium: 8 mg/dL — ABNORMAL LOW (ref 8.9–10.3)
Chloride: 106 mmol/L (ref 98–111)
Creatinine, Ser: 1.22 mg/dL — ABNORMAL HIGH (ref 0.44–1.00)
GFR, Estimated: 48 mL/min — ABNORMAL LOW (ref >60)
Glucose, Bld: 295 mg/dL — ABNORMAL HIGH (ref 70–99)
Potassium: 3.2 mmol/L — ABNORMAL LOW (ref 3.5–5.1)
Sodium: 139 mmol/L (ref 135–145)
Total Bilirubin: 0.9 mg/dL (ref 0.3–1.2)
Total Protein: 6.6 g/dL (ref 6.5–8.1)

## 2022-07-13 LAB — BRAIN NATRIURETIC PEPTIDE: B Natriuretic Peptide: 358.7 pg/mL — ABNORMAL HIGH (ref 0.0–100.0)

## 2022-07-13 LAB — PROTIME-INR
INR: 1.3 — ABNORMAL HIGH (ref 0.8–1.2)
Prothrombin Time: 16.3 seconds — ABNORMAL HIGH (ref 11.4–15.2)

## 2022-07-13 MED ORDER — SPIRONOLACTONE 25 MG PO TABS: 12.5000 mg | ORAL_TABLET | Freq: Every day | ORAL | 1 refills | Status: DC

## 2022-07-13 MED ORDER — TORSEMIDE 20 MG PO TABS: 40.0000 mg | ORAL_TABLET | Freq: Two times a day (BID) | ORAL | 3 refills | Status: DC

## 2022-07-13 NOTE — Progress Notes (Signed)
ReDS Vest / Clip - 07/13/22 1600       ReDS Vest / Clip   Station Marker B    Ruler Value 31    ReDS Value Range High volume overload    ReDS Actual Value 42

## 2022-07-13 NOTE — Addendum Note (Signed)
Encounter addended by: Kerry Dory, CMA on: 07/13/2022 4:31 PM  Actions taken: Charge Capture section accepted

## 2022-07-13 NOTE — Progress Notes (Addendum)
ADVANCED HF CLINIC  NOTE   Primary Care: Peggy Skeans, MD HF Cardiologist: Dr. Haroldine [name]  HPI: [name] is a 71 y.o. with history of DMII, COPD, chronic respiratory failure on home oxygen, DMII, HTN, St Jude ICD, tobacco abuse, NICM, and chronic systolic heart failure.    Presented to [name] 9/22 with increased shortness of breath. Admitted for A/C CHF. Started on IV lasix. Echo showed EF 20-25% and normal RV. AHF consulted for management, some concern that she may have low output. R/LHC deferred as symptoms improved with diuresis. GDMT was limited by elevated potassium. Cardiac rehab consulted. Losartan and spiro held at discharge.    Follow up 11/22, NYHA II and volume stable.  Admitted 5/23 with a/c CHF and COVID. Discharged home with oxygen. Follow up 6/23 with EP  Admitted 9/23 with sepsis 2/2 cellulitis of RLE. ID consulted. Underwent TEE to r/o endocarditis. TEE (9/23) showed LVEF 30%, moderate TR, no valvular vegetation or device vegetation. Admitted a couple weeks later with hypothermia and hypoglycemia. Insulin stopped and SGLT2i continued. Entresto and spiro held due to low BP.  Today she returns for HF follow up. We have not seen her since 11/22. Continues with [name] swelling. She lives alone and is SOB with mild activity. [place] last night trying to get out of her chair. Has HH PT once a week. Denies palpitations, CP, dizziness, or PND/Orthopnea. Appetite ok. No fever or chills. Weight at home 189 pounds. Taking all medications. Lives alone but friend helps with transportation. Had frequent falls. Smoked 1/2 ppd. Wears 2L oxygen at night.  Cardiac Studies  - TEE (9/23): EF 30%, moderate TR, no valvular vegetation or device vegetation.   - Echo (9/22): EF 20-25% RV normal , LA severely reduced,   - Echo (2018): EF 25-30% Grade IDD  - LHC 2017 Normal Coronaries.   Past Medical History:  Diagnosis Date   ATN (acute tubular necrosis) ([place]) 07/15/2014   Automatic implantable  cardioverter-defibrillator in situ    Automatic implantable cardioverter-defibrillator in situ 05/04/2007   Qualifier: Diagnosis of  By: [name]     Cardiac defibrillator in situ    Atlas II VR (SJM) implanted by Dr [name]   CHF (congestive heart failure) ([place])    Chronic combined systolic and diastolic heart failure ([place])    a. EF 35-40% in past;  b. Echo 7/13:  EF 45-50%, Gr 2 diast dysfn, mild AI, mild MAC, trivial MR, mild LAE, PASP 47.   Chronic ulcer of leg ([place])    04-09-15 resolved-not a problem.   Colon polyps 04/12/2013   Rectosigmoid polyp   COPD (chronic obstructive pulmonary disease) (HCC)    O2 at night   Depression    Diabetes mellitus    Diabetes mellitus, type 2 ([place]) 04/03/2012   HIGH RISK FEET.. Please have patient take shoes and socks off every visit for visual foot inspection.     Eczema    Elevated alkaline phosphatase level    GGT and 5'nucleotidase 8/13 normal   Health care maintenance 01/22/2013   Surgically absent cervix- no pap needed (Path report 07/2000: uterine body with attached bilateral  adnexa and separate cervix.)   History of oxygen administration    oxygen @ 2 l/m nasally bedtime 24/7   HTN (hypertension)    Hx of cardiac cath    a. [place] 2003 normal;  b. LHC 6/13:  Mild calcification in the LM, o/w normal coronary arteries, EF 45%.    Hyperkalemia 08/08/2017  Hyperlipidemia    Elevated triglyceride in 2019   Hyperlipidemia associated with type 2 diabetes mellitus ([place]) 05/25/2007   Qualifier: Diagnosis of  By: Peggy Done [name], [name]     Hyperthyroidism, subclinical    HYPERTHYROIDISM, SUBCLINICAL 05/06/2009   Qualifier: Diagnosis of  By: [name]     Implantable cardioverter-defibrillator (ICD) generator end of life    Lipoma    Need for hepatitis C screening test 04/25/2019   NICM (nonischemic cardiomyopathy) ([place])    Obesity    On home oxygen therapy    "2L; 24/7" (10/11/2016)   Post menopausal syndrome    Shortness of breath  07/14/2017   Skin rash 07/12/2018   Sleep apnea    pt denies 04/12/2013   Current Outpatient Medications  Medication Sig Dispense Refill   amoxicillin (AMOXIL) 500 MG capsule Take 2 capsules (1,000 mg total) by mouth every 8 (eight) hours. 42 capsule 0   aspirin EC 81 MG tablet Take 81 mg by mouth daily. Swallow whole.     atorvastatin (LIPITOR) 80 MG tablet Take 80 mg by mouth daily.     calcium-vitamin D (OSCAL WITH D) 500-200 MG-UNIT tablet Take 1 tablet by mouth 2 (two) times daily. 90 tablet 6   dapagliflozin propanediol (FARXIGA) 10 MG TABS tablet Take 1 tablet (10 mg total) by mouth daily. 30 tablet 2   furosemide (LASIX) 40 MG tablet Take 1 tablet (40 mg total) by mouth 2 (two) times daily. 180 tablet 3   metoprolol succinate (TOPROL-XL) 50 MG 24 hr tablet TAKE 1 TABLET(50 MG) BY MOUTH DAILY WITH OR IMMEDIATELY FOLLOWING A MEAL (Patient taking differently: Take 50 mg by mouth daily.) 90 tablet 3   mineral oil-hydrophilic petrolatum (AQUAPHOR) ointment Apply topically daily. Apply to entire body daily after bath 420 g 0   umeclidinium-vilanterol (ANORO ELLIPTA) 62.5-25 MCG/ACT AEPB Inhale 1 puff into the lungs daily. 60 each 2   albuterol (VENTOLIN HFA) 108 (90 Base) MCG/ACT inhaler Inhale 2 puffs into the lungs every 6 (six) hours as needed for wheezing or shortness of breath. 8 g 2   dupilumab (DUPIXENT) 200 MG/1.14ML prefilled syringe Inject 200 mg into the skin every 14 (fourteen) days. Every other Friday     No current facility-administered medications for this encounter.   Allergies  Allergen Reactions   Actos [Pioglitazone] Other (See Comments)    congestive heart failure    Naproxen Other (See Comments)    543m dose made her sleep for two days   Rosiglitazone Hives   Tomato Itching   Hydrocodone-Acetaminophen Itching   Hydrocortisone Hives and Itching   Social History   Socioeconomic History   Marital status: Significant Other    Spouse name: Not on file   Number  of children: 3   Years of education: 177  Highest education level: 11th grade  Occupational History   Occupation: disabled  Tobacco Use   Smoking status: Every Day    Packs/day: 0.50    Years: 45.00    Total pack years: 22.50    Types: Cigarettes   Smokeless tobacco: Never   Tobacco comments:    currently smoking 2 cigs per day  Vaping Use   Vaping Use: Never used  Substance and Sexual Activity   Alcohol use: Not Currently    Alcohol/week: 3.0 standard drinks of alcohol    Types: 3 Shots of liquor per week   Drug use: No   Sexual activity: Yes    Partners: Male  Birth control/protection: Surgical  Other Topics Concern   Not on file  Social History Narrative   ** Merged History Encounter **       Married   Patient enjoys going to [employer], church and spending time with family   Social Determinants of Health   Financial Resource Strain: Low Risk  (06/16/2021)   Overall Financial Resource Strain (CARDIA)    Difficulty of Paying Living Expenses: Not very hard  Food Insecurity: No Food Insecurity (06/27/2022)   Hunger Vital Sign    Worried About Running Out of Food in the Last Year: Never true    Ran Out of Food in the Last Year: Never true  Transportation Needs: No Transportation Needs (06/27/2022)   PRAPARE - Hydrologist (Medical): No    Lack of Transportation (Non-Medical): No  Physical Activity: Not on file  Stress: Not on file  Social Connections: Not on file  Intimate Partner Violence: Not At Risk (06/27/2022)   Humiliation, Afraid, Rape, and Kick questionnaire    Fear of Current or Ex-Partner: No    Emotionally Abused: No    Physically Abused: No    Sexually Abused: No   Family History  Problem Relation Age of Onset   Stroke Mother    Seizures Father    Heart disease Father    Diabetes Sister    Asthma Maternal Aunt        aunts   Asthma Maternal Uncle        uncles   Heart disease Paternal Aunt        aunts   Heart  disease Paternal Uncle        uncles   Heart disease Maternal Aunt        aunts   Heart disease Maternal Uncle        uncles   Heart disease Maternal Grandfather    Breast cancer Daughter    Breast cancer Cousin    Asthma Grandchild    Colon cancer Neg Hx    Colon polyps Neg Hx    Esophageal cancer Neg Hx    Kidney disease Neg Hx    Gallbladder disease Neg Hx    BP 110/88   Pulse 100 Comment: irregular  Wt 100.7 kg (222 lb)   SpO2 97%   BMI 40.60 kg/m   Wt Readings from Last 3 Encounters:  07/13/22 100.7 kg (222 lb)  07/04/22 94.8 kg (209 lb)  06/27/22 96.5 kg (212 lb 11.9 oz)   PHYSICAL EXAM: General:  NAD. No resp difficulty, arrived in [name] elderly HEENT: Normal Neck: Supple. JVP to jaw. Carotids 2+ bilat; no bruits. No lymphadenopathy or thryomegaly appreciated. Cor: PMI nondisplaced. Regular rate & rhythm. No rubs, gallops or murmurs. Lungs: Faint RLL crackles, diminished in bases Abdomen: Obese, soft, nontender, nondistended. No hepatosplenomegaly. No bruits or masses. Good bowel sounds. Extremities: No cyanosis, clubbing, rash, 2-3+ BLE edema to thighs Neuro: Alert & oriented x 3, cranial nerves grossly intact. Moves all 4 extremities w/o difficulty. Affect pleasant.  Device Interrogation: CoreVue suggests volume up, 2 hr/day activity (personally reviewed).  ECG (personally reviewed): NSR with PACs, 86 bpm  ASSESSMENT & PLAN:  Acute on chronic Systolic Heart Failure, NICM  - Had LHC 2017 normal coronaries. Has Single [employer] Jude ICD.  - Echo (9/22): EF 20-25% RV normal  - TEE (9/23): EF 25-30%, severe LV dysfunction, RV mildly reduced, moderate TR - NYHA IIIb, functional status difficult due to body  habitus and physical deconditioning. Volume up on CorVue and exam. - Stop Lasix. - Start torsemide 40 mg bid. - Restart spironolactone 12.5 mg daily. - Add ARB/ARNi next. - Continue Toprol XL 50 mg daily. - Continue Farxiga 10 mg daily . - She is enrolled in  [facility] clinic. - We discussed importance of daily weights and salt and fluid limitations. - Labs today. - Arrange for unna boots with [place] agency. Recent ABIs ok.   Chronic Respiratory Failure - On 2-3 L oxygen as needed. - 97% on RA today. - Has previously been referred to [name] for ILD.   DMII - Hgb A1C 9.7 (10/23)  - She is not on insulin. - On Farxiga. No GU symptoms.   Tobacco Abuse - Discussed smoking cessation.  - She is not ready to quit yet.   5. SDOH - Frequent falls. - Discussed fall precautions. - Consider paramedicine to help with med compliance.  Follow up in 2 weeks with APP (ReDs and BMET) and 3 months with Dr. Haroldine [name].  Allena Katz, [name]-BC  07/13/22

## 2022-07-13 NOTE — Patient Instructions (Signed)
Thank you for coming in today  Labs were done today, if any labs are abnormal the clinic will call you No news is good news  START Spirnolactone 12.5 mg 1/2 tablet daily  START Torsemide 40 mg 2 tablets twice daily  STOP Lasix   You have been given a prescription for for unna boots to give to [place] physician recommends that you schedule a follow-up appointment in:  2 weeks in clinic  3 months with Dr. Haroldine Laws  At the [facility], you and your health needs are our priority. As part of our continuing mission to provide you with exceptional heart care, we have created designated Provider Care Teams. These Care Teams include your primary Cardiologist (physician) and Advanced Practice Providers (APPs- Physician Assistants and Nurse Practitioners) who all work together to provide you with the care you need, when you need it.   You may see any of the following providers on your designated Care Team at your next follow up: Dr Glori Bickers Dr Loralie Champagne Dr. [name] Hires, NP Lyda Jester, [place] [facility] [place], [place] Forestine Na, NP Audry Riles, PharmD   Please be sure to bring in all your medications bottles to every appointment.   If you have any questions or concerns before your next appointment please send [place] a message through [place] or call our office at [phone].    TO LEAVE A MESSAGE FOR THE NURSE SELECT OPTION 2, PLEASE LEAVE A MESSAGE INCLUDING: YOUR NAME DATE OF BIRTH CALL BACK NUMBER REASON FOR CALL**this is important as we prioritize the call backs  YOU WILL RECEIVE A CALL BACK THE SAME DAY AS LONG AS YOU CALL BEFORE 4:00 PM

## 2022-07-13 NOTE — Progress Notes (Signed)
Called [facility] [phone] could notspeak to a live person for order to get pt unnaboots. Prescription was given for pt to give [facility] for unnaboots placement

## 2022-07-14 ENCOUNTER — Telehealth: Payer: Self-pay | Admitting: *Deleted

## 2022-07-14 LAB — CUP PACEART REMOTE DEVICE CHECK
Battery Remaining Longevity: 34 mo
Battery Remaining Percentage: 33 %
Battery Voltage: 2.84 V
Brady Statistic RV Percent Paced: 1 %
Date Time Interrogation Session: 20231011151634
HighPow Impedance: 36 Ohm
HighPow Impedance: 36 Ohm
Implantable Lead Implant Date: 20080801
Implantable Lead Location: 753860
Implantable Lead Model: 7120
Implantable Pulse Generator Implant Date: 20150701
Lead Channel Impedance Value: 390 Ohm
Lead Channel Pacing Threshold Amplitude: 0.75 V
Lead Channel Pacing Threshold Pulse Width: 0.5 ms
Lead Channel Sensing Intrinsic Amplitude: 10.8 mV
Lead Channel Setting Pacing Amplitude: 2.5 V
Lead Channel Setting Pacing Pulse Width: 0.5 ms
Lead Channel Setting Sensing Sensitivity: 0.5 mV
Pulse Gen Serial Number: 7206538

## 2022-07-14 NOTE — Telephone Encounter (Signed)
Call from [place] OT with [facility].  Would like verbal order to do OT evaluation for patient next week.  [phone].

## 2022-07-14 NOTE — Addendum Note (Signed)
Encounter addended by: Rafael Bihari, FNP on: 07/14/2022 9:06 AM  Actions taken: Clinical Note Signed

## 2022-07-14 NOTE — Telephone Encounter (Signed)
Please call Melissa at [phone] regarding some verbal orders.

## 2022-07-15 ENCOUNTER — Telehealth (HOSPITAL_COMMUNITY): Payer: Self-pay | Admitting: Cardiology

## 2022-07-15 MED ORDER — POTASSIUM CHLORIDE CRYS ER 20 MEQ PO TBCR: 20.0000 meq | EXTENDED_RELEASE_TABLET | Freq: Every day | ORAL | 3 refills | Status: DC

## 2022-07-15 NOTE — Telephone Encounter (Signed)
-----   Message from Rafael Bihari, [place] sent at 07/14/2022  8:27 AM EDT ----- K is low and BNP elevated.  We are restarting spiro so this should help raise K levels. With addition of increased torsemide, will start 20 KCL daily. She has repeat labs arranged.  I will forward labs to her PCP as well

## 2022-07-15 NOTE — Telephone Encounter (Signed)
Patient called.  Patient aware.  

## 2022-07-19 ENCOUNTER — Telehealth (HOSPITAL_COMMUNITY): Payer: Self-pay

## 2022-07-20 ENCOUNTER — Other Ambulatory Visit: Payer: Self-pay | Admitting: Internal Medicine

## 2022-07-20 DIAGNOSIS — J984 Other disorders of lung: Secondary | ICD-10-CM

## 2022-07-22 ENCOUNTER — Telehealth: Payer: Self-pay | Admitting: Internal Medicine

## 2022-07-22 ENCOUNTER — Ambulatory Visit (HOSPITAL_COMMUNITY)
Admission: RE | Admit: 2022-07-22 | Discharge: 2022-07-22 | Disposition: A | Discharge status: Home / Self Care | Payer: Medicare Other | Source: Ambulatory Visit | Attending: Internal Medicine | Admitting: Internal Medicine

## 2022-07-22 DIAGNOSIS — E042 Nontoxic multinodular goiter: Secondary | ICD-10-CM

## 2022-07-22 DIAGNOSIS — I130 Hypertensive heart and chronic kidney disease with heart failure and stage 1 through stage 4 chronic kidney disease, or unspecified chronic kidney disease: Secondary | ICD-10-CM | POA: Diagnosis not present

## 2022-07-22 DIAGNOSIS — I509 Heart failure, unspecified: Secondary | ICD-10-CM | POA: Diagnosis not present

## 2022-07-22 NOTE — Telephone Encounter (Signed)
Contacted patient regarding thyroid ultrasound results which showed one nodule that will need ultrasound surveillance q5 years and another that is recommended for biopsy. I have placed an order for biopsy of this node and discussed this plan with the patient. She had no further questions at this time.   Farrel Gordon, DO

## 2022-07-22 NOTE — Progress Notes (Signed)
This encounter was created in error - please disregard.

## 2022-07-22 NOTE — Telephone Encounter (Signed)
Error

## 2022-07-25 ENCOUNTER — Emergency Department (HOSPITAL_COMMUNITY): Payer: Medicare Other

## 2022-07-25 ENCOUNTER — Other Ambulatory Visit: Payer: Self-pay

## 2022-07-25 ENCOUNTER — Inpatient Hospital Stay (HOSPITAL_COMMUNITY)
Admission: EM | Admit: 2022-07-25 | Discharge: 2022-08-04 | DRG: 286 | Disposition: A | Discharge status: Home Health | Payer: Medicare Other | Attending: Internal Medicine | Admitting: Internal Medicine

## 2022-07-25 ENCOUNTER — Inpatient Hospital Stay (HOSPITAL_COMMUNITY): Payer: Medicare Other

## 2022-07-25 DIAGNOSIS — R609 Edema, unspecified: Secondary | ICD-10-CM | POA: Diagnosis not present

## 2022-07-25 DIAGNOSIS — I5023 Acute on chronic systolic (congestive) heart failure: Secondary | ICD-10-CM | POA: Diagnosis not present

## 2022-07-25 DIAGNOSIS — I130 Hypertensive heart and chronic kidney disease with heart failure and stage 1 through stage 4 chronic kidney disease, or unspecified chronic kidney disease: Secondary | ICD-10-CM | POA: Diagnosis present

## 2022-07-25 DIAGNOSIS — Z7951 Long term (current) use of inhaled steroids: Secondary | ICD-10-CM

## 2022-07-25 DIAGNOSIS — Z8249 Family history of ischemic heart disease and other diseases of the circulatory system: Secondary | ICD-10-CM

## 2022-07-25 DIAGNOSIS — Z888 Allergy status to other drugs, medicaments and biological substances status: Secondary | ICD-10-CM

## 2022-07-25 DIAGNOSIS — E669 Obesity, unspecified: Secondary | ICD-10-CM | POA: Diagnosis not present

## 2022-07-25 DIAGNOSIS — G9341 Metabolic encephalopathy: Secondary | ICD-10-CM | POA: Diagnosis present

## 2022-07-25 DIAGNOSIS — Z91018 Allergy to other foods: Secondary | ICD-10-CM

## 2022-07-25 DIAGNOSIS — Z79899 Other long term (current) drug therapy: Secondary | ICD-10-CM | POA: Diagnosis not present

## 2022-07-25 DIAGNOSIS — R570 Cardiogenic shock: Secondary | ICD-10-CM | POA: Diagnosis present

## 2022-07-25 DIAGNOSIS — N189 Chronic kidney disease, unspecified: Secondary | ICD-10-CM | POA: Diagnosis present

## 2022-07-25 DIAGNOSIS — I5022 Chronic systolic (congestive) heart failure: Secondary | ICD-10-CM | POA: Diagnosis present

## 2022-07-25 DIAGNOSIS — Z1152 Encounter for screening for COVID-19: Secondary | ICD-10-CM | POA: Diagnosis not present

## 2022-07-25 DIAGNOSIS — I4891 Unspecified atrial fibrillation: Secondary | ICD-10-CM | POA: Diagnosis present

## 2022-07-25 DIAGNOSIS — E1165 Type 2 diabetes mellitus with hyperglycemia: Secondary | ICD-10-CM | POA: Diagnosis present

## 2022-07-25 DIAGNOSIS — Z6841 Body Mass Index (BMI) 40.0 and over, adult: Secondary | ICD-10-CM | POA: Diagnosis not present

## 2022-07-25 DIAGNOSIS — Z886 Allergy status to analgesic agent status: Secondary | ICD-10-CM

## 2022-07-25 DIAGNOSIS — J9601 Acute respiratory failure with hypoxia: Secondary | ICD-10-CM | POA: Diagnosis not present

## 2022-07-25 DIAGNOSIS — N179 Acute kidney failure, unspecified: Secondary | ICD-10-CM | POA: Diagnosis present

## 2022-07-25 DIAGNOSIS — E785 Hyperlipidemia, unspecified: Secondary | ICD-10-CM | POA: Diagnosis present

## 2022-07-25 DIAGNOSIS — N1831 Chronic kidney disease, stage 3a: Secondary | ICD-10-CM | POA: Diagnosis present

## 2022-07-25 DIAGNOSIS — Z885 Allergy status to narcotic agent status: Secondary | ICD-10-CM

## 2022-07-25 DIAGNOSIS — F17210 Nicotine dependence, cigarettes, uncomplicated: Secondary | ICD-10-CM | POA: Diagnosis present

## 2022-07-25 DIAGNOSIS — E1169 Type 2 diabetes mellitus with other specified complication: Secondary | ICD-10-CM | POA: Diagnosis present

## 2022-07-25 DIAGNOSIS — I071 Rheumatic tricuspid insufficiency: Secondary | ICD-10-CM | POA: Diagnosis present

## 2022-07-25 DIAGNOSIS — E1122 Type 2 diabetes mellitus with diabetic chronic kidney disease: Secondary | ICD-10-CM | POA: Diagnosis present

## 2022-07-25 DIAGNOSIS — J441 Chronic obstructive pulmonary disease with (acute) exacerbation: Secondary | ICD-10-CM | POA: Diagnosis present

## 2022-07-25 DIAGNOSIS — Z95810 Presence of automatic (implantable) cardiac defibrillator: Secondary | ICD-10-CM | POA: Diagnosis not present

## 2022-07-25 DIAGNOSIS — E873 Alkalosis: Secondary | ICD-10-CM | POA: Diagnosis present

## 2022-07-25 DIAGNOSIS — Z91119 Patient's noncompliance with dietary regimen due to unspecified reason: Secondary | ICD-10-CM

## 2022-07-25 DIAGNOSIS — J9621 Acute and chronic respiratory failure with hypoxia: Secondary | ICD-10-CM | POA: Diagnosis present

## 2022-07-25 DIAGNOSIS — I10 Essential (primary) hypertension: Secondary | ICD-10-CM | POA: Diagnosis not present

## 2022-07-25 DIAGNOSIS — I428 Other cardiomyopathies: Secondary | ICD-10-CM | POA: Diagnosis present

## 2022-07-25 DIAGNOSIS — E876 Hypokalemia: Secondary | ICD-10-CM | POA: Diagnosis present

## 2022-07-25 DIAGNOSIS — Z833 Family history of diabetes mellitus: Secondary | ICD-10-CM

## 2022-07-25 DIAGNOSIS — J440 Chronic obstructive pulmonary disease with acute lower respiratory infection: Secondary | ICD-10-CM | POA: Diagnosis not present

## 2022-07-25 DIAGNOSIS — I4892 Unspecified atrial flutter: Secondary | ICD-10-CM | POA: Diagnosis present

## 2022-07-25 DIAGNOSIS — Z7982 Long term (current) use of aspirin: Secondary | ICD-10-CM

## 2022-07-25 DIAGNOSIS — N184 Chronic kidney disease, stage 4 (severe): Secondary | ICD-10-CM | POA: Diagnosis present

## 2022-07-25 DIAGNOSIS — Z825 Family history of asthma and other chronic lower respiratory diseases: Secondary | ICD-10-CM

## 2022-07-25 DIAGNOSIS — I509 Heart failure, unspecified: Secondary | ICD-10-CM | POA: Diagnosis present

## 2022-07-25 DIAGNOSIS — J449 Chronic obstructive pulmonary disease, unspecified: Secondary | ICD-10-CM | POA: Diagnosis present

## 2022-07-25 DIAGNOSIS — Z9981 Dependence on supplemental oxygen: Secondary | ICD-10-CM

## 2022-07-25 DIAGNOSIS — I5021 Acute systolic (congestive) heart failure: Secondary | ICD-10-CM | POA: Diagnosis not present

## 2022-07-25 DIAGNOSIS — I5032 Chronic diastolic (congestive) heart failure: Secondary | ICD-10-CM | POA: Diagnosis present

## 2022-07-25 DIAGNOSIS — E0590 Thyrotoxicosis, unspecified without thyrotoxic crisis or storm: Secondary | ICD-10-CM | POA: Diagnosis present

## 2022-07-25 DIAGNOSIS — Z7984 Long term (current) use of oral hypoglycemic drugs: Secondary | ICD-10-CM

## 2022-07-25 DIAGNOSIS — I5020 Unspecified systolic (congestive) heart failure: Secondary | ICD-10-CM | POA: Diagnosis present

## 2022-07-25 LAB — CBC WITH DIFFERENTIAL/PLATELET
Abs Immature Granulocytes: 0.03 10*3/uL (ref 0.00–0.07)
Basophils Absolute: 0.1 10*3/uL (ref 0.0–0.1)
Basophils Relative: 1 %
Eosinophils Absolute: 0.1 10*3/uL (ref 0.0–0.5)
Eosinophils Relative: 1 %
HCT: 41.6 % (ref 36.0–46.0)
Hemoglobin: 12.5 g/dL (ref 12.0–15.0)
Immature Granulocytes: 0 %
Lymphocytes Relative: 16 %
Lymphs Abs: 1.3 10*3/uL (ref 0.7–4.0)
MCH: 25.7 pg — ABNORMAL LOW (ref 26.0–34.0)
MCHC: 30 g/dL (ref 30.0–36.0)
MCV: 85.6 fL (ref 80.0–100.0)
Monocytes Absolute: 0.7 10*3/uL (ref 0.1–1.0)
Monocytes Relative: 8 %
Neutro Abs: 6.3 10*3/uL (ref 1.7–7.7)
Neutrophils Relative %: 74 %
Platelets: 275 10*3/uL (ref 150–400)
RBC: 4.86 MIL/uL (ref 3.87–5.11)
RDW: 20.4 % — ABNORMAL HIGH (ref 11.5–15.5)
WBC: 8.5 10*3/uL (ref 4.0–10.5)
nRBC: 0 % (ref 0.0–0.2)

## 2022-07-25 LAB — COMPREHENSIVE METABOLIC PANEL
ALT: 36 U/L (ref 0–44)
AST: 45 U/L — ABNORMAL HIGH (ref 15–41)
Albumin: 2.8 g/dL — ABNORMAL LOW (ref 3.5–5.0)
Alkaline Phosphatase: 266 U/L — ABNORMAL HIGH (ref 38–126)
Anion gap: 9 (ref 5–15)
BUN: 34 mg/dL — ABNORMAL HIGH (ref 8–23)
CO2: 30 mmol/L (ref 22–32)
Calcium: 8.8 mg/dL — ABNORMAL LOW (ref 8.9–10.3)
Chloride: 99 mmol/L (ref 98–111)
Creatinine, Ser: 1.74 mg/dL — ABNORMAL HIGH (ref 0.44–1.00)
GFR, Estimated: 32 mL/min — ABNORMAL LOW (ref >60)
Glucose, Bld: 311 mg/dL — ABNORMAL HIGH (ref 70–99)
Potassium: 4.8 mmol/L (ref 3.5–5.1)
Sodium: 138 mmol/L (ref 135–145)
Total Bilirubin: 1.6 mg/dL — ABNORMAL HIGH (ref 0.3–1.2)
Total Protein: 7.4 g/dL (ref 6.5–8.1)

## 2022-07-25 LAB — I-STAT VENOUS BLOOD GAS, ED
Acid-Base Excess: 6 mmol/L — ABNORMAL HIGH (ref 0.0–2.0)
Bicarbonate: 30.5 mmol/L — ABNORMAL HIGH (ref 20.0–28.0)
Calcium, Ion: 1.01 mmol/L — ABNORMAL LOW (ref 1.15–1.40)
HCT: 42 % (ref 36.0–46.0)
Hemoglobin: 14.3 g/dL (ref 12.0–15.0)
O2 Saturation: 72 %
Potassium: 4.9 mmol/L (ref 3.5–5.1)
Sodium: 138 mmol/L (ref 135–145)
TCO2: 32 mmol/L (ref 22–32)
pCO2, Ven: 40.7 mmHg — ABNORMAL LOW (ref 44–60)
pH, Ven: 7.483 — ABNORMAL HIGH (ref 7.25–7.43)
pO2, Ven: 35 mmHg (ref 32–45)

## 2022-07-25 LAB — RESP PANEL BY RT-PCR (FLU A&B, COVID) ARPGX2
Influenza A by PCR: NEGATIVE
Influenza B by PCR: NEGATIVE
SARS Coronavirus 2 by RT PCR: NEGATIVE

## 2022-07-25 LAB — TROPONIN I (HIGH SENSITIVITY)
Troponin I (High Sensitivity): 49 ng/L — ABNORMAL HIGH (ref <18)
Troponin I (High Sensitivity): 48 ng/L — ABNORMAL HIGH (ref <18)

## 2022-07-25 LAB — CBG MONITORING, ED
Glucose-Capillary: 224 mg/dL — ABNORMAL HIGH (ref 70–99)
Glucose-Capillary: 339 mg/dL — ABNORMAL HIGH (ref 70–99)

## 2022-07-25 LAB — LACTIC ACID, PLASMA
Lactic Acid, Venous: 2.9 mmol/L (ref 0.5–1.9)
Lactic Acid, Venous: 3.8 mmol/L (ref 0.5–1.9)

## 2022-07-25 LAB — BRAIN NATRIURETIC PEPTIDE: B Natriuretic Peptide: 461.6 pg/mL — ABNORMAL HIGH (ref 0.0–100.0)

## 2022-07-25 LAB — MAGNESIUM: Magnesium: 2.1 mg/dL (ref 1.7–2.4)

## 2022-07-25 MED ORDER — METOPROLOL TARTRATE 25 MG PO TABS: 37.5000 mg | ORAL_TABLET | Freq: Two times a day (BID) | ORAL | Status: DC

## 2022-07-25 MED ORDER — INSULIN GLARGINE-YFGN 100 UNIT/ML ~~LOC~~ SOLN
10.0000 [IU] | Freq: Every day | SUBCUTANEOUS | Status: DC
  Administered 2022-07-25 – 2022-07-30 (×6): 10 [IU] via SUBCUTANEOUS
  Filled 2022-07-25 (×6): qty 0.1

## 2022-07-25 MED ORDER — SODIUM CHLORIDE 0.9% FLUSH
3.0000 mL | Freq: Two times a day (BID) | INTRAVENOUS | Status: DC
  Administered 2022-07-25 – 2022-08-03 (×10): 3 mL via INTRAVENOUS

## 2022-07-25 MED ORDER — SPIRONOLACTONE 12.5 MG HALF TABLET: 12.5000 mg | ORAL_TABLET | Freq: Every day | ORAL | Status: DC

## 2022-07-25 MED ORDER — ATORVASTATIN CALCIUM 80 MG PO TABS
80.0000 mg | ORAL_TABLET | Freq: Every day | ORAL | Status: DC
  Administered 2022-07-26 – 2022-08-04 (×10): 80 mg via ORAL
  Filled 2022-07-25 (×9): qty 1
  Filled 2022-07-25: qty 2

## 2022-07-25 MED ORDER — HEPARIN (PORCINE) 25000 UT/250ML-% IV SOLN
1450.0000 [IU]/h | INTRAVENOUS | Status: DC
  Administered 2022-07-25: 1400 [IU]/h via INTRAVENOUS
  Administered 2022-07-26: 1600 [IU]/h via INTRAVENOUS
  Administered 2022-07-28: 1150 [IU]/h via INTRAVENOUS
  Administered 2022-07-29: 1350 [IU]/h via INTRAVENOUS
  Filled 2022-07-25 (×5): qty 250

## 2022-07-25 MED ORDER — ALBUTEROL SULFATE (2.5 MG/3ML) 0.083% IN NEBU
10.0000 mg/h | INHALATION_SOLUTION | Freq: Once | RESPIRATORY_TRACT | Status: AC
  Administered 2022-07-25: 10 mg/h via RESPIRATORY_TRACT
  Filled 2022-07-25: qty 3

## 2022-07-25 MED ORDER — FUROSEMIDE 10 MG/ML IJ SOLN: 80.0000 mg | Freq: Two times a day (BID) | INTRAMUSCULAR | Status: DC

## 2022-07-25 MED ORDER — MIDODRINE HCL 5 MG PO TABS
10.0000 mg | ORAL_TABLET | Freq: Three times a day (TID) | ORAL | Status: DC
  Administered 2022-07-26 (×2): 10 mg via ORAL
  Filled 2022-07-25 (×2): qty 2

## 2022-07-25 MED ORDER — MILRINONE LACTATE IN DEXTROSE 20-5 MG/100ML-% IV SOLN: 0.2500 ug/kg/min | INTRAVENOUS | Status: DC

## 2022-07-25 MED ORDER — IOHEXOL 350 MG/ML SOLN
60.0000 mL | Freq: Once | INTRAVENOUS | Status: AC | PRN
  Administered 2022-07-25: 60 mL via INTRAVENOUS

## 2022-07-25 MED ORDER — FUROSEMIDE 10 MG/ML IJ SOLN
80.0000 mg | Freq: Two times a day (BID) | INTRAMUSCULAR | Status: DC
  Administered 2022-07-25 – 2022-07-26 (×3): 80 mg via INTRAVENOUS
  Filled 2022-07-25 (×3): qty 8

## 2022-07-25 MED ORDER — ASPIRIN 81 MG PO TBEC
81.0000 mg | DELAYED_RELEASE_TABLET | Freq: Every day | ORAL | Status: DC
  Administered 2022-07-26 – 2022-08-04 (×9): 81 mg via ORAL
  Filled 2022-07-25 (×9): qty 1

## 2022-07-25 MED ORDER — NICOTINE 21 MG/24HR TD PT24
21.0000 mg | MEDICATED_PATCH | Freq: Every day | TRANSDERMAL | Status: DC
  Administered 2022-07-28 – 2022-08-03 (×6): 21 mg via TRANSDERMAL
  Filled 2022-07-25 (×9): qty 1

## 2022-07-25 MED ORDER — METHYLPREDNISOLONE SODIUM SUCC 40 MG IJ SOLR
40.0000 mg | Freq: Two times a day (BID) | INTRAMUSCULAR | Status: DC
  Administered 2022-07-25 – 2022-07-26 (×2): 40 mg via INTRAVENOUS
  Filled 2022-07-25 (×2): qty 1

## 2022-07-25 MED ORDER — HEPARIN BOLUS VIA INFUSION
4000.0000 [IU] | Freq: Once | INTRAVENOUS | Status: AC
  Administered 2022-07-25: 4000 [IU] via INTRAVENOUS
  Filled 2022-07-25: qty 4000

## 2022-07-25 MED ORDER — LEVALBUTEROL HCL 0.63 MG/3ML IN NEBU
0.6300 mg | INHALATION_SOLUTION | Freq: Four times a day (QID) | RESPIRATORY_TRACT | Status: DC
  Administered 2022-07-25: 0.63 mg via RESPIRATORY_TRACT
  Filled 2022-07-25: qty 3

## 2022-07-25 MED ORDER — FUROSEMIDE 10 MG/ML IJ SOLN
80.0000 mg | Freq: Two times a day (BID) | INTRAMUSCULAR | Status: DC
  Administered 2022-07-25: 80 mg via INTRAVENOUS
  Filled 2022-07-25: qty 8

## 2022-07-25 MED ORDER — DIGOXIN 0.25 MG/ML IJ SOLN: 0.1250 mg | Freq: Four times a day (QID) | INTRAMUSCULAR | Status: DC

## 2022-07-25 MED ORDER — ONDANSETRON HCL 4 MG/2ML IJ SOLN
4.0000 mg | Freq: Four times a day (QID) | INTRAMUSCULAR | Status: DC | PRN
  Administered 2022-07-27: 4 mg via INTRAVENOUS
  Filled 2022-07-25: qty 2

## 2022-07-25 MED ORDER — AMIODARONE HCL IN DEXTROSE 360-4.14 MG/200ML-% IV SOLN
60.0000 mg/h | INTRAVENOUS | Status: AC
  Administered 2022-07-25: 60 mg/h via INTRAVENOUS
  Filled 2022-07-25: qty 200

## 2022-07-25 MED ORDER — UMECLIDINIUM-VILANTEROL 62.5-25 MCG/ACT IN AEPB
1.0000 | INHALATION_SPRAY | Freq: Every day | RESPIRATORY_TRACT | Status: DC
  Administered 2022-07-26 – 2022-08-04 (×7): 1 via RESPIRATORY_TRACT
  Filled 2022-07-25: qty 14

## 2022-07-25 MED ORDER — AMIODARONE LOAD VIA INFUSION
150.0000 mg | Freq: Once | INTRAVENOUS | Status: AC
  Administered 2022-07-25: 150 mg via INTRAVENOUS
  Filled 2022-07-25: qty 83.34

## 2022-07-25 MED ORDER — AMIODARONE HCL IN DEXTROSE 360-4.14 MG/200ML-% IV SOLN
30.0000 mg/h | INTRAVENOUS | Status: DC
  Administered 2022-07-25: 30 mg/h via INTRAVENOUS
  Administered 2022-07-26 – 2022-07-30 (×17): 60 mg/h via INTRAVENOUS
  Administered 2022-07-31 (×2): 30 mg/h via INTRAVENOUS
  Administered 2022-07-31: 60 mg/h via INTRAVENOUS
  Filled 2022-07-25 (×22): qty 200

## 2022-07-25 MED ORDER — LEVALBUTEROL HCL 0.63 MG/3ML IN NEBU: 0.6300 mg | INHALATION_SOLUTION | Freq: Four times a day (QID) | RESPIRATORY_TRACT | Status: DC | PRN

## 2022-07-25 MED ORDER — LEVALBUTEROL HCL 0.63 MG/3ML IN NEBU
0.6300 mg | INHALATION_SOLUTION | Freq: Three times a day (TID) | RESPIRATORY_TRACT | Status: DC
  Administered 2022-07-26 – 2022-07-28 (×6): 0.63 mg via RESPIRATORY_TRACT
  Filled 2022-07-25 (×8): qty 3

## 2022-07-25 MED ORDER — SODIUM CHLORIDE 0.9 % IV SOLN: 250.0000 mL | INTRAVENOUS | Status: DC | PRN

## 2022-07-25 MED ORDER — INSULIN ASPART 100 UNIT/ML IJ SOLN
0.0000 [IU] | Freq: Every day | INTRAMUSCULAR | Status: DC
  Administered 2022-07-25: 4 [IU] via SUBCUTANEOUS
  Administered 2022-07-26: 5 [IU] via SUBCUTANEOUS
  Administered 2022-08-01: 2 [IU] via SUBCUTANEOUS

## 2022-07-25 MED ORDER — SODIUM CHLORIDE 0.9% FLUSH: 3.0000 mL | INTRAVENOUS | Status: DC | PRN

## 2022-07-25 MED ORDER — INSULIN ASPART 100 UNIT/ML IJ SOLN
0.0000 [IU] | Freq: Three times a day (TID) | INTRAMUSCULAR | Status: DC
  Administered 2022-07-26: 15 [IU] via SUBCUTANEOUS
  Administered 2022-07-26 (×2): 11 [IU] via SUBCUTANEOUS
  Administered 2022-07-27: 3 [IU] via SUBCUTANEOUS
  Administered 2022-07-27: 5 [IU] via SUBCUTANEOUS
  Administered 2022-07-27 – 2022-07-29 (×2): 3 [IU] via SUBCUTANEOUS
  Administered 2022-07-30: 2 [IU] via SUBCUTANEOUS
  Administered 2022-07-31: 3 [IU] via SUBCUTANEOUS
  Administered 2022-07-31: 5 [IU] via SUBCUTANEOUS
  Administered 2022-08-01: 2 [IU] via SUBCUTANEOUS
  Administered 2022-08-01: 8 [IU] via SUBCUTANEOUS
  Administered 2022-08-02: 3 [IU] via SUBCUTANEOUS
  Administered 2022-08-02: 11 [IU] via SUBCUTANEOUS
  Administered 2022-08-02: 2 [IU] via SUBCUTANEOUS
  Administered 2022-08-03 (×2): 3 [IU] via SUBCUTANEOUS
  Administered 2022-08-04: 2 [IU] via SUBCUTANEOUS

## 2022-07-25 MED ORDER — METOPROLOL TARTRATE 5 MG/5ML IV SOLN: 5.0000 mg | Freq: Four times a day (QID) | INTRAVENOUS | Status: DC | PRN

## 2022-07-25 NOTE — ED Notes (Signed)
Heparin infusion switched to IV in RAC

## 2022-07-25 NOTE — ED Triage Notes (Signed)
Pt is arriving from home via [facility]. Pt was complaining of SOB since last night. EMS arrived on scene and pt had a RR of 43/minute. Pt spO2 was 92% on RA. EMS originally heard wheezing in the upper chest and rales in the lower. Pt was placed on CPAP for a few minutes and became more alert and responsive. Bilateral swelling of the lower extremities present. Pt has mucous producing cough. Pt was given '5mg'$  of albuterol and  of ativan. Pt has hx of COPD, CHF, and asthma.     LVS  BP 150/80  HR 140  RR 36 SpO2 100% on nonrebreather

## 2022-07-25 NOTE — Progress Notes (Signed)
RT placed pt on BIPAP per MD order. Pt tolerating settings well at this time. RT will continue to monitor.

## 2022-07-25 NOTE — ED Notes (Signed)
MD made aware of patient lactic level. Message seen, no new orders received.

## 2022-07-25 NOTE — ED Notes (Signed)
Patient denies pain and is resting comfortably. Patient refused her meal tray offered some snacks and a drink.

## 2022-07-25 NOTE — Progress Notes (Signed)
Patient was taken off BIPAP by MD and given ice chips. Patient was placed on 4L [place] by this RT. Patient is not in any distress at this time.

## 2022-07-25 NOTE — H&P (Addendum)
History and Physical    [name] FGH:[id number] DOB: [birth year] DOA: 07/25/2022  PCP: Peggy Skeans, MD (Confirm with patient/family/NH records and if not entered, this has to be entered at [facility] point of entry) Patient coming from: Home  I have personally briefly reviewed patient's old medical records in [place]  Chief Complaint: Cough wheezing shortness of breath  HPI: [name] is a 71 y.o. female with medical history significant of COPD Gold stage II with chronic hypoxic respite failure on nocturnal nasal cannula, chronic combined HFpEF and HFrEF, LVEF 20-25%, status post AICD, IIDM, morbid obesity, cigarette smoker, presented with increasing cough wheezing shortness of breath and leg swelling.  Patient has significant severe respite distress and on BiPAP, history provided by friend at bedside and daughter over the phone.  Family reported that patient recently had a lower extremity cellulitis completed antibiotic treatment about 4 weeks ago.  Since then patient has developed swelling leg and increasing shortness of breath coughing and wheezing.  Patient denies any chest pain, she admitted has been coughing up whitish phlegm occasionally, and worsening of wheezing in the last 3 days.  No fever chills.  Daughter also reported the patient appeared to have a significant decreased ambulation function, unable to walk 3 stairs in front of her house.  ED Course: Patient was found to be in a flutter which is new.  Blood pressure borderline low, hypoxic and with significant tachypnea, chest x-ray showed lungs congested.  Patient was started on BiPAP.  Work showed AKI creatinine 1.7.  Baseline 1.2, VBG showed no significant CO2 retention and normal pH 7.48, hemoglobin 12.5.  K4.8  Cardiology consulted and patient was started on IV Lasix.  Review of Systems: A unable to perform patient has extreme respiratory distress and in BiPAP. Past Medical History:  Diagnosis Date   ATN (acute  tubular necrosis) ([place]) 07/15/2014   Automatic implantable cardioverter-defibrillator in situ    Automatic implantable cardioverter-defibrillator in situ 05/04/2007   Qualifier: Diagnosis of  By: Isla Pence     Cardiac defibrillator in situ    Atlas II VR (SJM) implanted by Dr Lovena Le   CHF (congestive heart failure) ([place])    Chronic combined systolic and diastolic heart failure ([place])    a. EF 35-40% in past;  b. Echo 7/13:  EF 45-50%, Gr 2 diast dysfn, mild AI, mild MAC, trivial MR, mild LAE, PASP 47.   Chronic ulcer of leg ([place])    04-09-15 resolved-not a problem.   Colon polyps 04/12/2013   Rectosigmoid polyp   COPD (chronic obstructive pulmonary disease) (HCC)    O2 at night   Depression    Diabetes mellitus    Diabetes mellitus, type 2 ([place]) 04/03/2012   HIGH RISK FEET.. Please have patient take shoes and socks off every visit for visual foot inspection.     Eczema    Elevated alkaline phosphatase level    GGT and 5'nucleotidase 8/13 normal   Health care maintenance 01/22/2013   Surgically absent cervix- no pap needed (Path report 07/2000: uterine body with attached bilateral  adnexa and separate cervix.)   History of oxygen administration    oxygen @ 2 l/m nasally bedtime 24/7   HTN (hypertension)    Hx of cardiac cath    a. [place] 2003 normal;  b. [facility] 6/13:  Mild calcification in the LM, o/w normal coronary arteries, EF 45%.    Hyperkalemia 08/08/2017   Hyperlipidemia    Elevated triglyceride in 2019  Hyperlipidemia associated with type 2 diabetes mellitus ([place]) 05/25/2007   Qualifier: Diagnosis of  By: Hassell Done FNP, [place]     Hyperthyroidism, subclinical    HYPERTHYROIDISM, SUBCLINICAL 05/06/2009   Qualifier: Diagnosis of  By: Darrick Meigs     Implantable cardioverter-defibrillator (ICD) generator end of life    Lipoma    Need for hepatitis C screening test 04/25/2019   NICM (nonischemic cardiomyopathy) ([place])    Obesity    On home oxygen therapy    "2L; 24/7" (10/11/2016)    Post menopausal syndrome    Shortness of breath 07/14/2017   Skin rash 07/12/2018   Sleep apnea    pt denies 04/12/2013    Past Surgical History:  Procedure Laterality Date   ABDOMINAL HYSTERECTOMY     CARDIAC CATHETERIZATION     CARDIAC CATHETERIZATION N/A 06/21/2016   Procedure: Left Heart Cath and Coronary Angiography;  Surgeon: Sherren Mocha, MD;  Location: [facility];  Service: Cardiovascular;  Laterality: N/A;   CARDIAC DEFIBRILLATOR PLACEMENT  05/04/2007   SJM Atlas II VR ICD   CARDIAC DEFIBRILLATOR PLACEMENT     CATARACT EXTRACTION     od   COLONOSCOPY N/A 04/12/2013   Procedure: COLONOSCOPY;  Surgeon: Beryle Beams, MD;  Location: [facility];  Service: Endoscopy;  Laterality: N/A;  pt.has defibrilator   COLONOSCOPY N/A 04/16/2015   Procedure: COLONOSCOPY;  Surgeon: [name], MD;  Location: [facility];  Service: Endoscopy;  Laterality: N/A;   ESOPHAGOGASTRODUODENOSCOPY N/A 04/16/2015   Procedure: ESOPHAGOGASTRODUODENOSCOPY (EGD);  Surgeon: [name], MD;  Location: [facility];  Service: Endoscopy;  Laterality: N/A;   EYE SURGERY     HERNIA REPAIR     HYSTEROSCOPY     IMPLANTABLE CARDIOVERTER DEFIBRILLATOR (ICD) GENERATOR CHANGE N/A 04/02/2014   Procedure: ICD GENERATOR CHANGE;  Surgeon: Evans Lance, MD;  Location: [facility] CATH LAB;  Service: Cardiovascular;  Laterality: N/A;   INSERT / REPLACE / REMOVE PACEMAKER     LEFT HEART CATHETERIZATION WITH CORONARY ANGIOGRAM N/A 04/02/2012   Procedure: LEFT HEART CATHETERIZATION WITH CORONARY ANGIOGRAM;  Surgeon: Hillary Bow, MD;  Location: [facility] CATH LAB;  Service: Cardiovascular;  Laterality: N/A;   ORIF ANKLE FRACTURE Right 08/12/2019   Procedure: OPEN REDUCTION INTERNAL FIXATION (ORIF) RIGHT TRIMALLEOLAR ANKLE FRACTURE;  Surgeon: Leandrew Koyanagi, MD;  Location: [facility];  Service: Orthopedics;  Laterality: Right;   TEE WITHOUT CARDIOVERSION N/A 06/23/2022   Procedure: TRANSESOPHAGEAL ECHOCARDIOGRAM (TEE);   Surgeon: [name], MD;  Location: [facility];  Service: Cardiology;  Laterality: N/A;   TONSILLECTOMY     TUBAL LIGATION       reports that she has been smoking cigarettes. She has a 22.50 pack-year smoking history. She has never used smokeless tobacco. She reports that she does not currently use alcohol after a past usage of about 3.0 standard drinks of alcohol per week. She reports that she does not use drugs.  Allergies  Allergen Reactions   Actos [Pioglitazone] Other (See Comments)    congestive heart failure    Naproxen Other (See Comments)    541m dose made her sleep for two days   Rosiglitazone Hives   Tomato Itching   Hydrocodone-Acetaminophen Itching   Hydrocortisone Hives and Itching    Family History  Problem Relation Age of Onset   Stroke Mother    Seizures Father    Heart disease Father    Diabetes Sister    Asthma Maternal Aunt  aunts   Asthma Maternal Uncle        uncles   Heart disease Paternal Aunt        aunts   Heart disease Paternal Uncle        uncles   Heart disease Maternal Aunt        aunts   Heart disease Maternal Uncle        uncles   Heart disease Maternal Grandfather    Breast cancer Daughter    Breast cancer Cousin    Asthma Grandchild    Colon cancer Neg Hx    Colon polyps Neg Hx    Esophageal cancer Neg Hx    Kidney disease Neg Hx    Gallbladder disease Neg Hx     Prior to Admission medications   Medication Sig Start Date End Date Taking? Authorizing Provider  albuterol (VENTOLIN HFA) 108 (90 Base) MCG/ACT inhaler Inhale 2 puffs into the lungs every 6 (six) hours as needed for wheezing or shortness of breath. 04/28/21   Maudie Mercury, MD  amoxicillin (AMOXIL) 500 MG capsule Take 2 capsules (1,000 mg total) by mouth every 8 (eight) hours. 06/24/22   Mapp, Claudia Desanctis, MD  ANORO ELLIPTA 62.5-25 MCG/ACT AEPB INHALE 1 PUFF INTO THE LUNGS DAILY 07/20/22   Mapp, Claudia Desanctis, MD  aspirin EC 81 MG tablet Take 81 mg by mouth daily.  Swallow whole.    [provider]  atorvastatin (LIPITOR) 80 MG tablet Take 80 mg by mouth daily.    [provider]  calcium-vitamin D (OSCAL WITH D) 500-200 MG-UNIT tablet Take 1 tablet by mouth 2 (two) times daily. 02/12/20   Kathi Ludwig, MD  dapagliflozin propanediol (FARXIGA) 10 MG TABS tablet Take 1 tablet (10 mg total) by mouth daily. 05/02/22   Gaylan Gerold, DO  dupilumab (DUPIXENT) 200 MG/1.14ML prefilled syringe Inject 200 mg into the skin every 14 (fourteen) days. Every other Friday    [provider]  metoprolol succinate (TOPROL-XL) 50 MG 24 hr tablet TAKE 1 TABLET(50 MG) BY MOUTH DAILY WITH OR IMMEDIATELY FOLLOWING A MEAL Patient taking differently: Take 50 mg by mouth daily. 06/24/21   Maudie Mercury, MD  mineral oil-hydrophilic petrolatum (AQUAPHOR) ointment Apply topically daily. Apply to entire body daily after bath 06/30/22   Rick Duff, MD  potassium chloride SA (KLOR-CON M) 20 MEQ tablet Take 1 tablet (20 mEq total) by mouth daily. 07/15/22   [place], Maricela Bo, FNP  spironolactone (ALDACTONE) 25 MG tablet Take 0.5 tablets (12.5 mg total) by mouth daily. 07/13/22 07/02/24  Rafael Bihari, FNP  torsemide (DEMADEX) 20 MG tablet Take 2 tablets (40 mg total) by mouth 2 (two) times daily. 07/13/22 01/09/23  Rafael Bihari, FNP    Physical Exam: Vitals:   07/25/22 1615 07/25/22 1648 07/25/22 1653 07/25/22 1702  BP: (!) 139/96     Pulse: (!) 124 (!) 122  (!) 128  Resp: (!) 27 (!) 25  20  Temp:   99 F (37.2 C)   SpO2: 100% 100%  100%    Constitutional: NAD, calm, comfortable Vitals:   07/25/22 1615 07/25/22 1648 07/25/22 1653 07/25/22 1702  BP: (!) 139/96     Pulse: (!) 124 (!) 122  (!) 128  Resp: (!) 27 (!) 25  20  Temp:   99 F (37.2 C)   SpO2: 100% 100%  100%   Eyes: PERRL, lids and conjunctivae normal ENMT: Mucous membranes are moist. Posterior pharynx clear of any exudate or lesions.Normal dentition.  Neck: normal,  supple, no masses, no thyromegaly Respiratory: clear to auscultation bilaterally, diffused wheezing and crackles bilaterally, increasing breathing effort. No accessory muscle use.  Cardiovascular: Irregular heart rate and tachycardia, no murmurs / rubs / gallops. 2+ extremity edema. 2+ pedal pulses. No carotid bruits.  Abdomen: no tenderness, no masses palpated. No hepatosplenomegaly. Bowel sounds positive.  Musculoskeletal: no clubbing / cyanosis. No joint deformity upper and lower extremities. Good ROM, no contractures. Normal muscle tone.  Skin: no rashes, lesions, ulcers. No induration Neurologic: CN 2-12 grossly intact. Sensation intact, DTR normal. Strength 5/5 in all 4.  Psychiatric: Normal judgment and insight. Alert and oriented x 3. Normal mood.     Labs on Admission: I have personally reviewed following labs and imaging studies  CBC: Recent Labs  Lab 07/25/22 1320 07/25/22 1325  WBC 8.5  --   NEUTROABS 6.3  --   HGB 12.5 14.3  HCT 41.6 42.0  MCV 85.6  --   PLT 275  --    Basic Metabolic Panel: Recent Labs  Lab 07/25/22 1318 07/25/22 1320 07/25/22 1325  NA  --  138 138  K  --  4.8 4.9  CL  --  99  --   CO2  --  30  --   GLUCOSE  --  311*  --   BUN  --  34*  --   CREATININE  --  1.74*  --   CALCIUM  --  8.8*  --   MG 2.1  --   --    GFR: Estimated Creatinine Clearance: 34.3 mL/min (A) (by C-G formula based on SCr of 1.74 mg/dL (H)). Liver Function Tests: Recent Labs  Lab 07/25/22 1320  AST 45*  ALT 36  ALKPHOS 266*  BILITOT 1.6*  PROT 7.4  ALBUMIN 2.8*   No results for input(s): "LIPASE", "AMYLASE" in the last 168 hours. No results for input(s): "AMMONIA" in the last 168 hours. Coagulation Profile: No results for input(s): "INR", "PROTIME" in the last 168 hours. Cardiac Enzymes: No results for input(s): "CKTOTAL", "CKMB", "CKMBINDEX", "TROPONINI" in the last 168 hours. BNP (last 3 results) No results for input(s): "PROBNP" in the last 8760  hours. HbA1C: No results for input(s): "HGBA1C" in the last 72 hours. CBG: No results for input(s): "GLUCAP" in the last 168 hours. Lipid Profile: No results for input(s): "CHOL", "HDL", "LDLCALC", "TRIG", "CHOLHDL", "LDLDIRECT" in the last 72 hours. Thyroid Function Tests: No results for input(s): "TSH", "T4TOTAL", "FREET4", "T3FREE", "THYROIDAB" in the last 72 hours. Anemia Panel: No results for input(s): "VITAMINB12", "FOLATE", "FERRITIN", "TIBC", "IRON", "RETICCTPCT" in the last 72 hours. Urine analysis:    Component Value Date/Time   COLORURINE YELLOW 06/26/2022 1725   APPEARANCEUR CLEAR 06/26/2022 1725   LABSPEC 1.020 06/26/2022 1725   PHURINE 5.5 06/26/2022 1725   GLUCOSEU 100 (A) 06/26/2022 1725   HGBUR NEGATIVE 06/26/2022 1725   HGBUR negative 11/08/2010 0854   BILIRUBINUR NEGATIVE 06/26/2022 1725   KETONESUR NEGATIVE 06/26/2022 1725   PROTEINUR NEGATIVE 06/26/2022 1725   UROBILINOGEN 1.0 01/26/2015 2028   NITRITE NEGATIVE 06/26/2022 1725   LEUKOCYTESUR NEGATIVE 06/26/2022 1725    Radiological Exams on Admission: DG Chest Port 1 View  Result Date: 07/25/2022 CLINICAL DATA:  Shortness of breath. EXAM: PORTABLE CHEST 1 VIEW COMPARISON:  06/17/2022 FINDINGS: 1345 hours. The cardio pericardial silhouette is enlarged. The lungs are clear without focal pneumonia, edema, pneumothorax or pleural effusion. Interstitial markings are diffusely coarsened with chronic features. Bones are diffusely demineralized. Telemetry  leads overlie the chest. IMPRESSION: Enlarged cardiopericardial silhouette without acute cardiopulmonary findings. Electronically Signed   By: Misty Stanley M.D.   On: 07/25/2022 13:51    EKG: Independently reviewed.  A flutter with 2-1 transduction, no acute ST changes.  Prolonged QTc 566  Assessment/Plan Active Problems:   Chronic combined systolic and diastolic congestive heart failure (HCC)   Atrial flutter (HCC)   CHF (congestive heart failure) ([name])   (please populate well all problems here in Problem List. (For example, if patient is on BP meds at home and you resume or decide to hold them, it is a problem that needs to be her. Same for CAD, COPD, HLD and so on)  A flutter with RVR -Unknown exactly how long from the new onset -As result, not a candidate for cardioversion -Given there is a concurrent CHF decompensation, decided to use digoxin, half loading dose 0.125 mg every 6 hours x2 -Increase her metoprolol dosage to 37.5 mg twice daily -As needed Lopressor for breakthrough heart rate -No history of stroke, CHAD2=3, started on heparin drip in the ED, no history of GI bleed, no anemia at this point.  Acute on chronic combined HFrEF and HFpEF -Likely secondary to uncontrolled A-fib/a flutter management as above -IV diuresis as per cardiology -TSH normal within the mont -Check DVT study  Acute COPD exacerbation -Avoid beta agonist -Use Xopenex, IV Solu-Medrol for now -Continue Dulera -Incentive spirometry and flutter valve  Acute on chronic hypoxic respite failure -Newly to CHF decompensation and COPD exacerbation, management as above  AKI -Fluid overloaded, likely from CHF decompensation, and cardiorenal syndrome -Diuresis, monitor BMP  IIDM -Uncontrolled with hyperglycemia -Start Lantus and sliding scale  Uncontrolled hypertension -Increase metoprolol, hold off spironolactone for AKI  Morbid obesity -BMI=40 -Calorie control recommended  Cigarette smoke -Cessation education performed at bedside -Nicotine patch  DVT prophylaxis: Heparin drip Code Status: Full code Family Communication: Daughter at bedside Disposition Plan: Patient sick with acute hypoxic respite failure, likely from CHF and COPD, requiring IV diuresis and IV steroid, and initial BiPAP expect more than 2 midnight hospital stay Consults called: Cardiology Admission status: PCU   Lequita Halt MD [facility] Pager [phone]  07/25/2022, 5:13  PM

## 2022-07-25 NOTE — ED Notes (Signed)
Dr.lawsing shown results of VBG. ED-Lab.

## 2022-07-25 NOTE — Consult Note (Signed)
Advanced Heart Failure Team Consult Note   Primary Physician: [name], MD HF -Cardiologist:Dr Bensimhon  Reason for Consultation: A/C HFrEF  HPI:    [name] is seen today for evaluation of  A/C HFrEF at the request of Dr Armandina Gemma.   [name] is a 71 year old with a history of DMII, COPD, HTN, chronic respiratory failure, tobacco abuse, St Jude ICD, and chronic HFrEF. EF has 25-30% for at least 5 years. Last LHC 2017, normal coronaries.   Echo 06/2021 - LVEF 20-25% RV normal, LA severely dilated.   Admitted last month with sepsis 2/2 cellulitis fo RLE. TEE negative for vegetation and EF 25-30%.  Readmitted later that month with hypoglycemia/hypothermia. Insulin stopped and SGLT2i continued.  MRA and ARNi held.   She was seen in the HF clinic earlier this month. Volume overloaded. Lasix switched to torsemide and spiro was added.    Taking all medications but had progressive shortness of breath + orthopnea over the last few days.    Presented to ED via EMS. Placed on Bipap. Given albuterol and ativan. CXR - no acute findings. EGK A flutter 140s Pertinent labs: Hgb 12.5, BNP 461, SARS2 negative, HS Trop 49, K 4.8, and creatinine 1.78.   Review of Systems: [y] = yes, _0  = no   General: Weight gain _1 ; Weight loss _2 ; Anorexia _3 ; Fatigue [ Y]; Fever _4 ; Chills _5 ; Weakness [ Y]  Cardiac: Chest pain/pressure _6 ; Resting SOB [ Y]; Exertional SOB [ Y]; Orthopnea [ Y]; Pedal Edema [Y ]; Palpitations _7 ; Syncope _8 ; Presyncope _9 ; Paroxysmal nocturnal dyspnea_10   Pulmonary: Cough _11 ; Wheezing_12 ; Hemoptysis_13 ; Sputum _14 ; Snoring _15   GI: Vomiting_16 ; Dysphagia_17 ; Melena_18 ; Hematochezia _19 ; Heartburn_20 ; Abdominal pain _21 ; Constipation _22 ; Diarrhea _23 ; BRBPR _24   GU: Hematuria_25 ; Dysuria _26 ; Nocturia_27   Vascular: Pain in legs with walking _28 ; Pain in feet with lying flat _29 ; Non-healing sores _30 ; Stroke _31 ; TIA _32 ; Slurred speech _33 ;  Neuro: Headaches_34 ;  Vertigo_35 ; Seizures_36 ; Paresthesias_37 ;Blurred vision _38 ; Diplopia _39 ; Vision changes _40   Ortho/Skin: Arthritis _41 ; Joint pain [ Y]; Muscle pain _42 ; Joint swelling _43 ; Back Pain [Y ]; Rash _44   Psych: Depression_45 ; Anxiety_46   Heme: Bleeding problems _47 ; Clotting disorders _48 ; Anemia _49   Endocrine: Diabetes [ Y]; Thyroid dysfunction_50   Home Medications Prior to Admission medications   Medication Sig Start Date End Date Taking? Authorizing Provider  albuterol (VENTOLIN HFA) 108 (90 Base) MCG/ACT inhaler Inhale 2 puffs into the lungs every 6 (six) hours as needed for wheezing or shortness of breath. 04/28/21   Maudie Mercury, MD  amoxicillin (AMOXIL) 500 MG capsule Take 2 capsules (1,000 mg total) by mouth every 8 (eight) hours. 06/24/22   Mapp, Claudia Desanctis, MD  ANORO ELLIPTA 62.5-25 MCG/ACT AEPB INHALE 1 PUFF INTO THE LUNGS DAILY 07/20/22   Mapp, Claudia Desanctis, MD  aspirin EC 81 MG tablet Take 81 mg by mouth daily. Swallow whole.    [provider]  atorvastatin (LIPITOR) 80 MG tablet Take 80 mg by mouth daily.    [provider]  calcium-vitamin D (OSCAL WITH D) 500-200 MG-UNIT tablet Take 1 tablet by mouth 2 (two) times daily. 02/12/20   Kathi Ludwig, MD  dapagliflozin propanediol [place])  10 MG TABS tablet Take 1 tablet (10 mg total) by mouth daily. 05/02/22   Gaylan Gerold, DO  dupilumab (DUPIXENT) 200 MG/1.14ML prefilled syringe Inject 200 mg into the skin every 14 (fourteen) days. Every other Friday    [provider]  metoprolol succinate (TOPROL-XL) 50 MG 24 hr tablet TAKE 1 TABLET(50 MG) BY MOUTH DAILY WITH OR IMMEDIATELY FOLLOWING A MEAL Patient taking differently: Take 50 mg by mouth daily. 06/24/21   Maudie Mercury, MD  mineral oil-hydrophilic petrolatum (AQUAPHOR) ointment Apply topically daily. Apply to entire body daily after bath 06/30/22   Rick Duff, MD  potassium chloride SA (KLOR-CON M) 20 MEQ tablet Take 1 tablet (20 mEq total) by mouth  daily. 07/15/22   Milford, Maricela Bo, FNP  spironolactone (ALDACTONE) 25 MG tablet Take 0.5 tablets (12.5 mg total) by mouth daily. 07/13/22 07/02/24  Rafael Bihari, FNP  torsemide (DEMADEX) 20 MG tablet Take 2 tablets (40 mg total) by mouth 2 (two) times daily. 07/13/22 01/09/23  Rafael Bihari, FNP    Past Medical History: Past Medical History:  Diagnosis Date   ATN (acute tubular necrosis) ([place]) 07/15/2014   Automatic implantable cardioverter-defibrillator in situ    Automatic implantable cardioverter-defibrillator in situ 05/04/2007   Qualifier: Diagnosis of  By: Isla Pence     Cardiac defibrillator in situ    Atlas II VR (SJM) implanted by Dr Lovena Le   CHF (congestive heart failure) ([place])    Chronic combined systolic and diastolic heart failure ([place])    a. EF 35-40% in past;  b. Echo 7/13:  EF 45-50%, Gr 2 diast dysfn, mild AI, mild MAC, trivial MR, mild LAE, PASP 47.   Chronic ulcer of leg ([place])    04-09-15 resolved-not a problem.   Colon polyps 04/12/2013   Rectosigmoid polyp   COPD (chronic obstructive pulmonary disease) ([facility])    O2 at night   Depression    Diabetes mellitus    Diabetes mellitus, type 2 ([place]) 04/03/2012   HIGH RISK FEET.. Please have patient take shoes and socks off every visit for visual foot inspection.     Eczema    Elevated alkaline phosphatase level    GGT and 5'nucleotidase 8/13 normal   Health care maintenance 01/22/2013   Surgically absent cervix- no pap needed (Path report 07/2000: uterine body with attached bilateral  adnexa and separate cervix.)   History of oxygen administration    oxygen @ 2 l/m nasally bedtime 24/7   HTN (hypertension)    Hx of cardiac cath    a. [place] 2003 normal;  b. LHC 6/13:  Mild calcification in the LM, o/w normal coronary arteries, EF 45%.    Hyperkalemia 08/08/2017   Hyperlipidemia    Elevated triglyceride in 2019   Hyperlipidemia associated with type 2 diabetes mellitus ([place]) 05/25/2007   Qualifier: Diagnosis of   By: Hassell Done FNP, [place]     Hyperthyroidism, subclinical    HYPERTHYROIDISM, SUBCLINICAL 05/06/2009   Qualifier: Diagnosis of  By: Darrick Meigs     Implantable cardioverter-defibrillator (ICD) generator end of life    Lipoma    Need for hepatitis C screening test 04/25/2019   NICM (nonischemic cardiomyopathy) ([place])    Obesity    On home oxygen therapy    "2L; 24/7" (10/11/2016)   Post menopausal syndrome    Shortness of breath 07/14/2017   Skin rash 07/12/2018   Sleep apnea    pt denies 04/12/2013    Past Surgical History: Past Surgical History:  Procedure Laterality Date   ABDOMINAL HYSTERECTOMY     CARDIAC CATHETERIZATION     CARDIAC CATHETERIZATION N/A 06/21/2016   Procedure: Left Heart Cath and Coronary Angiography;  Surgeon: Sherren Mocha, MD;  Location: [facility];  Service: Cardiovascular;  Laterality: N/A;   CARDIAC DEFIBRILLATOR PLACEMENT  05/04/2007   SJM Atlas II VR ICD   CARDIAC DEFIBRILLATOR PLACEMENT     CATARACT EXTRACTION     od   COLONOSCOPY N/A 04/12/2013   Procedure: COLONOSCOPY;  Surgeon: Beryle Beams, MD;  Location: [facility];  Service: Endoscopy;  Laterality: N/A;  pt.has defibrilator   COLONOSCOPY N/A 04/16/2015   Procedure: COLONOSCOPY;  Surgeon: [name], MD;  Location: [facility];  Service: Endoscopy;  Laterality: N/A;   ESOPHAGOGASTRODUODENOSCOPY N/A 04/16/2015   Procedure: ESOPHAGOGASTRODUODENOSCOPY (EGD);  Surgeon: [name], MD;  Location: [facility];  Service: Endoscopy;  Laterality: N/A;   EYE SURGERY     HERNIA REPAIR     HYSTEROSCOPY     IMPLANTABLE CARDIOVERTER DEFIBRILLATOR (ICD) GENERATOR CHANGE N/A 04/02/2014   Procedure: ICD GENERATOR CHANGE;  Surgeon: Evans Lance, MD;  Location: [facility] C[facility] LAB;  Service: Cardiovascular;  Laterality: N/A;   INSERT / REPLACE / REMOVE PACEMAKER     LEFT HEART CATHETERIZATION WITH CORONARY ANGIOGRAM N/A 04/02/2012   Procedure: LEFT HEART CATHETERIZATION WITH CORONARY ANGIOGRAM;   Surgeon: Hillary Bow, MD;  Location: [facility] CATH LAB;  Service: Cardiovascular;  Laterality: N/A;   ORIF ANKLE FRACTURE Right 08/12/2019   Procedure: OPEN REDUCTION INTERNAL FIXATION (ORIF) RIGHT TRIMALLEOLAR ANKLE FRACTURE;  Surgeon: Leandrew Koyanagi, MD;  Location: [facility];  Service: Orthopedics;  Laterality: Right;   TEE WITHOUT CARDIOVERSION N/A 06/23/2022   Procedure: TRANSESOPHAGEAL ECHOCARDIOGRAM (TEE);  Surgeon: [name], MD;  Location: [facility];  Service: Cardiology;  Laterality: N/A;   TONSILLECTOMY     TUBAL LIGATION      Family History: Family History  Problem Relation Age of Onset   Stroke Mother    Seizures Father    Heart disease Father    Diabetes Sister    Asthma Maternal Aunt        aunts   Asthma Maternal Uncle        uncles   Heart disease Paternal Aunt        aunts   Heart disease Paternal Uncle        uncles   Heart disease Maternal Aunt        aunts   Heart disease Maternal Uncle        uncles   Heart disease Maternal Grandfather    Breast cancer Daughter    Breast cancer Cousin    Asthma Grandchild    Colon cancer Neg Hx    Colon polyps Neg Hx    Esophageal cancer Neg Hx    Kidney disease Neg Hx    Gallbladder disease Neg Hx     Social History: Social History   Socioeconomic History   Marital status: Significant Other    Spouse name: Not on file   Number of children: 3   Years of education: 11   Highest education level: 11th grade  Occupational History   Occupation: disabled  Tobacco Use   Smoking status: Every Day    Packs/day: 0.50    Years: 45.00    Total pack years: 22.50    Types: Cigarettes   Smokeless tobacco: Never   Tobacco comments:    currently smoking 2  cigs per day  Vaping Use   Vaping Use: Never used  Substance and Sexual Activity   Alcohol use: Not Currently    Alcohol/week: 3.0 standard drinks of alcohol    Types: 3 Shots of liquor per week   Drug use: No   Sexual activity: Yes    Partners: Male     Birth control/protection: Surgical  Other Topics Concern   Not on file  Social History Narrative   ** Merged History Encounter **       Married   Patient enjoys going to [employer], church and spending time with family   Social Determinants of Health   Financial Resource Strain: Low Risk  (06/16/2021)   Overall Financial Resource Strain (CARDIA)    Difficulty of Paying Living Expenses: Not very hard  Food Insecurity: No Food Insecurity (06/27/2022)   Hunger Vital Sign    Worried About Running Out of Food in the Last Year: Never true    [place] in the Last Year: Never true  Transportation Needs: No Transportation Needs (06/27/2022)   PRAPARE - Hydrologist (Medical): No    Lack of Transportation (Non-Medical): No  Physical Activity: Not on file  Stress: Not on file  Social Connections: Not on file    Allergies:  Allergies  Allergen Reactions   Actos [Pioglitazone] Other (See Comments)    congestive heart failure    Naproxen Other (See Comments)    531m dose made her sleep for two days   Rosiglitazone Hives   Tomato Itching   Hydrocodone-Acetaminophen Itching   Hydrocortisone Hives and Itching    Objective:    Vital Signs:   Pulse Rate:  [138] 138 (10/23 1341) Resp:  [30] 30 (10/23 1341) BP: (151)/(110) 151/110 (10/23 1341) SpO2:  [100 %] 100 % (10/23 1341) FiO2 (%):  [40 %] 40 % (10/23 1341)    Weight change: There were no vitals filed for this visit.  Intake/Output:  No intake or output data in the 24 hours ending 07/25/22 1410    Physical Exam    General:  On Bipap  HEENT: normal Neck: supple. JVP difficult to assess with Bipap . Carotids 2+ bilat; no bruits. No lymphadenopathy or thyromegaly appreciated. Cor: PMI nondisplaced. Regular rate & rhythm. No rubs, gallops or murmurs. Lungs: clear Abdomen: soft, nontender, nondistended. No hepatosplenomegaly. No bruits or masses. Good bowel sounds. Extremities: no cyanosis,  clubbing, rash, R and LLE 2-3+ edema Neuro: alert & orientedx3, cranial nerves grossly intact. moves all 4 extremities w/o difficulty. Affect flat   Telemetry   A flutter 140s   EKG    A flutter 141   Labs   Basic Metabolic Panel: Recent Labs  Lab 07/25/22 1325  NA 138  K 4.9    Liver Function Tests: No results for input(s): "AST", "ALT", "ALKPHOS", "BILITOT", "PROT", "ALBUMIN" in the last 168 hours. No results for input(s): "LIPASE", "AMYLASE" in the last 168 hours. No results for input(s): "AMMONIA" in the last 168 hours.  CBC: Recent Labs  Lab 07/25/22 1320 07/25/22 1325  WBC 8.5  --   NEUTROABS 6.3  --   HGB 12.5 14.3  HCT 41.6 42.0  MCV 85.6  --   PLT 275  --     Cardiac Enzymes: No results for input(s): "CKTOTAL", "CKMB", "CKMBINDEX", "TROPONINI" in the last 168 hours.  BNP: BNP (last 3 results) Recent Labs    02/09/22 1145 06/26/22 1445 07/13/22 1609  BNP 122.9* 418.1* 358.7*    ProBNP (last 3 results) No results for input(s): "PROBNP" in the last 8760 hours.   CBG: No results for input(s): "GLUCAP" in the last 168 hours.  Coagulation Studies: No results for input(s): "LABPROT", "INR" in the last 72 hours.   Imaging   DG Chest Port 1 View  Result Date: 07/25/2022 CLINICAL DATA:  Shortness of breath. EXAM: PORTABLE CHEST 1 VIEW COMPARISON:  06/17/2022 FINDINGS: 1345 hours. The cardio pericardial silhouette is enlarged. The lungs are clear without focal pneumonia, edema, pneumothorax or pleural effusion. Interstitial markings are diffusely coarsened with chronic features. Bones are diffusely demineralized. Telemetry leads overlie the chest. IMPRESSION: Enlarged cardiopericardial silhouette without acute cardiopulmonary findings. Electronically Signed   By: Misty Stanley M.D.   On: 07/25/2022 13:51     Medications:     Current Medications:   Infusions:     Patient Profile   Peggy Beaston is a 71 year old with a history of DMII, COPD,  HTN, chronic respiratory failure, tobacco abuse, St Jude ICD, and chronic HFrEF. EF has 25-30% for at least 5 years. Last LHC 2017, normal coronaries.   Presented with increased shortness of breath. New onset A flutter and A/C HFrEF with marked volume overload.  Assessment/Plan   1. Acute on Chronic Hypoxic Respiratory Failure  -Chronically on 2-3 liters . Placed on Bipap on arrival. Sats stable. Attempt to diurese with IV lasix. Hopefully can transition to nasal cannula with diuresis.  -SARS2 negative CTA pending.   2. A/C HFrEF -Echo EF 25-30% which has been down for many years. Repeat ECHO once back in SR.  - Interrogate St Jude ICD. BNP 461.  - Marked volume overload. Start 80 mg IV lasix twice a day and follow for response.  - Check lactic acid.  - Hold off GDMT with AKI and decompensation.  - Add unna boots.   3. New Onset A flutter RVR - Of note had TEE 06/2022- no thrombus noted.  -Start heparin drip with eventual transition to eliquis.   -May need to add amio but will hold off for now.  - Diurese with IV lasix with eventual TEE/DC-CV. Aim to restore SR.  -If decompensates will need urgent cardioversion.   4. AKI  -Creatinine baseline ~ 1.2  -On admit 1.7 -Hold spiro.  5. DMII  6. Obesity     Length of Stay: 0  Darrick Grinder, NP  07/25/2022, 2:10 PM  Advanced Heart Failure Team Pager [phone] (M-F; 7a - 5p)  Please contact [facility] for night-coverage after hours (4p -7a ) and weekends on [email or web address]

## 2022-07-25 NOTE — ED Provider Notes (Signed)
[facility] EMERGENCY DEPARTMENT Provider Note   CSN: [id number] Arrival date & time: 07/25/22  1304     History  No chief complaint on file.   [name] is a 71 y.o. female.  HPI   71 year old female with medical history significant for CHF status post ICD, DM 2, COPD who presents to the emergency department with shortness of breath.  The patient states that she has been feeling short of breath since last night.  She called EMS due to worsening dyspnea and EMS found the patient to be tachypneic at 43 respirations per minute.  The patient's O2 sats were 92% on room air and there was some wheezing noted on exam.  She was placed on CPAP with improvement in her respirations and overall mental status.  She was noted to have bilateral swelling of the lower extremities.  She states that she has had a productive cough over the past few days.  She was administered 5 mg of albuterol with EMS.  She denies any chest pain.    Home Medications Prior to Admission medications   Medication Sig Start Date End Date Taking? Authorizing Provider  albuterol (VENTOLIN HFA) 108 (90 Base) MCG/ACT inhaler Inhale 2 puffs into the lungs every 6 (six) hours as needed for wheezing or shortness of breath. 04/28/21   Maudie Mercury, MD  amoxicillin (AMOXIL) 500 MG capsule Take 2 capsules (1,000 mg total) by mouth every 8 (eight) hours. 06/24/22   Mapp, Claudia Desanctis, MD  ANORO ELLIPTA 62.5-25 MCG/ACT AEPB INHALE 1 PUFF INTO THE LUNGS DAILY 07/20/22   Mapp, Claudia Desanctis, MD  aspirin EC 81 MG tablet Take 81 mg by mouth daily. Swallow whole.    [provider]  atorvastatin (LIPITOR) 80 MG tablet Take 80 mg by mouth daily.    [provider]  calcium-vitamin D (OSCAL WITH D) 500-200 MG-UNIT tablet Take 1 tablet by mouth 2 (two) times daily. 02/12/20   Kathi Ludwig, MD  dapagliflozin propanediol (FARXIGA) 10 MG TABS tablet Take 1 tablet (10 mg total) by mouth daily. 05/02/22   Gaylan Gerold,  DO  dupilumab (DUPIXENT) 200 MG/1.14ML prefilled syringe Inject 200 mg into the skin every 14 (fourteen) days. Every other Friday    [provider]  metoprolol succinate (TOPROL-XL) 50 MG 24 hr tablet TAKE 1 TABLET(50 MG) BY MOUTH DAILY WITH OR IMMEDIATELY FOLLOWING A MEAL Patient taking differently: Take 50 mg by mouth daily. 06/24/21   Maudie Mercury, MD  mineral oil-hydrophilic petrolatum (AQUAPHOR) ointment Apply topically daily. Apply to entire body daily after bath 06/30/22   Rick Duff, MD  potassium chloride SA (KLOR-CON M) 20 MEQ tablet Take 1 tablet (20 mEq total) by mouth daily. 07/15/22   Milford, Maricela Bo, FNP  spironolactone (ALDACTONE) 25 MG tablet Take 0.5 tablets (12.5 mg total) by mouth daily. 07/13/22 07/02/24  Rafael Bihari, FNP  torsemide (DEMADEX) 20 MG tablet Take 2 tablets (40 mg total) by mouth 2 (two) times daily. 07/13/22 01/09/23  Rafael Bihari, FNP      Allergies    Actos [pioglitazone], Naproxen, Rosiglitazone, Tomato, Hydrocodone-acetaminophen, and Hydrocortisone    Review of Systems   Review of Systems  All other systems reviewed and are negative.   Physical Exam Updated Vital Signs BP 98/64   Pulse (!) 124   Temp 98.8 F (37.1 C) (Oral)   Resp 16   SpO2 100%  Physical Exam Vitals and nursing note reviewed.  Constitutional:  General: She is not in acute distress. HENT:     Head: Normocephalic and atraumatic.  Eyes:     Conjunctiva/sclera: Conjunctivae normal.     Pupils: Pupils are equal, round, and reactive to light.  Cardiovascular:     Rate and Rhythm: Regular rhythm. Tachycardia present.  Pulmonary:     Effort: Pulmonary effort is normal. No respiratory distress.     Breath sounds: Wheezing and rales present.  Abdominal:     General: There is no distension.     Tenderness: There is no guarding.  Musculoskeletal:        General: No deformity or signs of injury.     Cervical back: Neck supple.     Right lower  leg: Edema present.     Left lower leg: Edema present.  Skin:    Findings: No lesion or rash.  Neurological:     General: No focal deficit present.     Mental Status: She is alert. Mental status is at baseline.     ED Results / Procedures / Treatments   Labs (all labs ordered are listed, but only abnormal results are displayed) Labs Reviewed  COMPREHENSIVE METABOLIC PANEL - Abnormal; Notable for the following components:      Result Value   Glucose, Bld 311 (*)    BUN 34 (*)    Creatinine, Ser 1.74 (*)    Calcium 8.8 (*)    Albumin 2.8 (*)    AST 45 (*)    Alkaline Phosphatase 266 (*)    Total Bilirubin 1.6 (*)    GFR, Estimated 32 (*)    All other components within normal limits  CBC WITH DIFFERENTIAL/PLATELET - Abnormal; Notable for the following components:   MCH 25.7 (*)    RDW 20.4 (*)    All other components within normal limits  BRAIN NATRIURETIC PEPTIDE - Abnormal; Notable for the following components:   B Natriuretic Peptide 461.6 (*)    All other components within normal limits  LACTIC ACID, PLASMA - Abnormal; Notable for the following components:   Lactic Acid, Venous 2.9 (*)    All other components within normal limits  LACTIC ACID, PLASMA - Abnormal; Notable for the following components:   Lactic Acid, Venous 3.8 (*)    All other components within normal limits  I-STAT VENOUS BLOOD GAS, ED - Abnormal; Notable for the following components:   pH, Ven 7.483 (*)    pCO2, Ven 40.7 (*)    Bicarbonate 30.5 (*)    Acid-Base Excess 6.0 (*)    Calcium, Ion 1.01 (*)    All other components within normal limits  CBG MONITORING, ED - Abnormal; Notable for the following components:   Glucose-Capillary 224 (*)    All other components within normal limits  CBG MONITORING, ED - Abnormal; Notable for the following components:   Glucose-Capillary 339 (*)    All other components within normal limits  TROPONIN I (HIGH SENSITIVITY) - Abnormal; Notable for the following  components:   Troponin I (High Sensitivity) 49 (*)    All other components within normal limits  TROPONIN I (HIGH SENSITIVITY) - Abnormal; Notable for the following components:   Troponin I (High Sensitivity) 48 (*)    All other components within normal limits  RESP PANEL BY RT-PCR (FLU A&B, COVID) ARPGX2  MAGNESIUM  HEPARIN LEVEL (UNFRACTIONATED)  HEPARIN LEVEL (UNFRACTIONATED)  CBC  BASIC METABOLIC PANEL  COOXEMETRY PANEL  COOXEMETRY PANEL    EKG EKG Interpretation  Date/Time:  Monday July 25 2022 13:16:56 EDT Ventricular Rate:  141 PR Interval:    QRS Duration: 113 QT Interval:  364 QTC Calculation: 566 R Axis:   121 Text Interpretation: Atrial flutter with rapid ventricular response Low voltage, extremity and precordial leads Probable anterolateral infarct, old ST elevation, consider inferior injury Prolonged QT interval Confirmed by Regan Lemming ([id number]) on 07/25/2022 2:08:11 PM  Radiology CT Angio Chest PE W and/or Wo Contrast  Result Date: 07/25/2022 CLINICAL DATA:  Shortness of breath, cough, leg swelling, history COPD, CHF, asthma, question pulmonary embolism EXAM: CT ANGIOGRAPHY CHEST WITH CONTRAST TECHNIQUE: Multidetector CT imaging of the chest was performed using the standard protocol during bolus administration of intravenous contrast. Multiplanar CT image reconstructions and MIPs were obtained to evaluate the vascular anatomy. RADIATION DOSE REDUCTION: This exam was performed according to the departmental dose-optimization program which includes automated exposure control, adjustment of the mA and/or kV according to patient size and/or use of iterative reconstruction technique. CONTRAST:  47m OMNIPAQUE IOHEXOL 350 MG/ML SOLN IV COMPARISON:  CT chest 11/14/2018 FINDINGS: Cardiovascular: Scattered atherosclerotic calcifications aorta, proximal great vessels, and coronary arteries. Aorta normal caliber. LEFT subclavian ICD lead within RIGHT ventricle. Enlargement of  cardiac chambers. No pericardial effusion. Pulmonary arteries adequately opacified and patent. No evidence of pulmonary embolism. Mediastinum/Nodes: Normal sized axillary lymph nodes. No thoracic adenopathy. Esophagus unremarkable. Base of cervical region normal appearance. Lungs/Pleura: Trace BILATERAL pleural effusions. Scattered mosaic attenuation in both lungs similar to prior exam. Minimal bibasilar atelectasis. No acute infiltrate, pleural effusion, or pneumothorax. Few tiny nodular foci in RIGHT upper lobe appear grossly unchanged. Upper Abdomen: Reflux of contrast into IVC and hepatic veins question elevated RIGHT heart pressure. Calcified granuloma superiorly in liver versus diaphragmatic unchanged. Remaining visualized upper abdomen unremarkable. Musculoskeletal: Scattered subcutaneous and soft tissue edema. No acute osseous findings. Review of the MIP images confirms the above findings. IMPRESSION: No evidence of pulmonary embolism. Scattered mosaic attenuation in both lungs with minimal bibasilar atelectasis and trace BILATERAL pleural effusions. Scattered atherosclerotic calcifications including coronary arteries. Enlargement of cardiac chambers with question elevated RIGHT heart pressures. Aortic Atherosclerosis (ICD10-I70.0). Electronically Signed   By: MLavonia DanaM.D.   On: 07/25/2022 19:03   DG Chest Port 1 View  Result Date: 07/25/2022 CLINICAL DATA:  Shortness of breath. EXAM: PORTABLE CHEST 1 VIEW COMPARISON:  06/17/2022 FINDINGS: 1345 hours. The cardio pericardial silhouette is enlarged. The lungs are clear without focal pneumonia, edema, pneumothorax or pleural effusion. Interstitial markings are diffusely coarsened with chronic features. Bones are diffusely demineralized. Telemetry leads overlie the chest. IMPRESSION: Enlarged cardiopericardial silhouette without acute cardiopulmonary findings. Electronically Signed   By: EMisty StanleyM.D.   On: 07/25/2022 13:51     Procedures .Critical Care  Performed by: LRegan Lemming MD Authorized by: LRegan Lemming MD   Critical care provider statement:    Critical care time (minutes):  30   Critical care was time spent personally by me on the following activities:  Development of treatment plan with patient or surrogate, discussions with consultants, evaluation of patient's response to treatment, examination of patient, ordering and review of laboratory studies, ordering and review of radiographic studies, ordering and performing treatments and interventions, pulse oximetry, re-evaluation of patient's condition and review of old charts   Care discussed with: admitting provider       Medications Ordered in ED Medications  heparin ADULT infusion 100 units/mL (25000 units/2527m (1,400 Units/hr Intravenous New Bag/Given 07/25/22 1748)  aspirin EC tablet 81 mg (has no  administration in time range)  atorvastatin (LIPITOR) tablet 80 mg (has no administration in time range)  umeclidinium-vilanterol (ANORO ELLIPTA) 62.5-25 MCG/ACT 1 puff (1 puff Inhalation Not Given 07/25/22 1802)  methylPREDNISolone sodium succinate (SOLU-MEDROL) 40 mg/mL injection 40 mg (40 mg Intravenous Given 07/25/22 1843)  sodium chloride flush (NS) 0.9 % injection 3 mL (3 mLs Intravenous Given 07/25/22 2154)  sodium chloride flush (NS) 0.9 % injection 3 mL (has no administration in time range)  0.9 %  sodium chloride infusion (has no administration in time range)  ondansetron (ZOFRAN) injection 4 mg (has no administration in time range)  insulin aspart (novoLOG) injection 0-15 Units (has no administration in time range)  insulin aspart (novoLOG) injection 0-5 Units (4 Units Subcutaneous Given 07/25/22 2154)  insulin glargine-yfgn (SEMGLEE) injection 10 Units (10 Units Subcutaneous Given 07/25/22 1954)  levalbuterol (XOPENEX) nebulizer solution 0.63 mg (has no administration in time range)  nicotine (NICODERM CQ - dosed in mg/24 hours) patch  21 mg (0 mg Transdermal Hold 07/25/22 1949)  furosemide (LASIX) injection 80 mg (80 mg Intravenous Given 07/25/22 1837)  amiodarone (NEXTERONE PREMIX) 360-4.14 MG/200ML-% (1.8 mg/mL) IV infusion (0 mg/hr Intravenous Stopped 07/25/22 2237)    Followed by  amiodarone (NEXTERONE PREMIX) 360-4.14 MG/200ML-% (1.8 mg/mL) IV infusion (30 mg/hr Intravenous New Bag/Given 07/25/22 2240)  levalbuterol (XOPENEX) nebulizer solution 0.63 mg (has no administration in time range)  albuterol (PROVENTIL) (2.5 MG/3ML) 0.083% nebulizer solution (10 mg/hr Nebulization Given 07/25/22 1338)  heparin bolus via infusion 4,000 Units (4,000 Units Intravenous Bolus from Bag 07/25/22 1748)  amiodarone (NEXTERONE) 1.8 mg/mL load via infusion 150 mg (150 mg Intravenous Bolus from Bag 07/25/22 1853)  iohexol (OMNIPAQUE) 350 MG/ML injection 60 mL (60 mLs Intravenous Contrast Given 07/25/22 1833)    ED Course/ Medical Decision Making/ A&P Clinical Course as of 07/25/22 2242  Mon Jul 25, 2022  1447 B Natriuretic Peptide(!): 461.6 [JL]  1447 Troponin I (High Sensitivity)(!): 49 [JL]  1447 Creatinine(!): 1.74 [JL]    Clinical Course User Index [JL] Regan Lemming, MD                           Medical Decision Making Amount and/or Complexity of Data Reviewed Labs: ordered. Decision-making details documented in ED Course. Radiology: ordered.  Risk Prescription drug management. Decision regarding hospitalization.    71 year old female with medical history significant for CHF status post ICD, DM 2, COPD who presents to the emergency department with shortness of breath.  The patient states that she has been feeling short of breath since last night.  She called EMS due to worsening dyspnea and EMS found the patient to be tachypneic at 43 respirations per minute.  The patient's O2 sats were 92% on room air and there was some wheezing noted on exam.  She was placed on CPAP with improvement in her respirations and overall mental  status.  She was noted to have bilateral swelling of the lower extremities.  She states that she has had a productive cough over the past few days.  She was administered 5 mg of albuterol with EMS.  She denies any chest pain.    On arrival, the patient was afebrile, tachycardic, atrial flutter noted on the cardiac monitor, tachypneic RR 30, BP 151/110, saturating 90% on room air.  EKG revealed atrial flutter with rapid ventricular response.  The patient is not on anticoagulation.  She states that she has been symptomatic for the past few days.  Some wheezing noted on exam with evidence of volume overload.  Concern for mixed picture with CHF and COPD.  Work-up concerning for cardiomegaly, no overt pulmonary edema.  CTA PE study was performed: IMPRESSION:  No evidence of pulmonary embolism.    Scattered mosaic attenuation in both lungs with minimal bibasilar  atelectasis and trace BILATERAL pleural effusions.    Scattered atherosclerotic calcifications including coronary  arteries.    Enlargement of cardiac chambers with question elevated RIGHT heart  pressures.    Aortic Atherosclerosis (ICD10-I70.0).    Laboratory evaluation significant for CBC without a leukocytosis or anemia, COVID-19 and influenza PCR testing negative, VBG with a respiratory alkalosis likely from tachypnea with a pH of 7.48, PCO2 of 41.  The patient was briefly placed on BiPAP and subsequently weaned off.  CMP revealed evidence of an AKI with a creatinine of 1.74.  The patient was administered albuterol nebulizer, Solu-Medrol.  Cardiology was consulted for further recommendations.  Plan for aggressive diuresis with IV Lasix given the patient's history of CHF with a last EF of 25%.  We will start the patient on anticoagulation given her atrial flutter with RVR.  Following evaluation, cardiology recommended initiation of amiodarone load and infusion.  Patient was then admitted to the hospitalist service for further evaluation and  management.    Final Clinical Impression(s) / ED Diagnoses Final diagnoses:  AKI (acute kidney injury) ([place])  Acute on chronic congestive heart failure, unspecified heart failure type ([place])  Atrial flutter with rapid ventricular response [facility])    Rx / DC Orders ED Discharge Orders     None         Regan Lemming, MD 07/25/22 2242

## 2022-07-25 NOTE — Progress Notes (Signed)
ANTICOAGULATION CONSULT NOTE  Pharmacy Consult for heparin Indication: atrial fibrillation  Allergies  Allergen Reactions   Actos [Pioglitazone] Other (See Comments)    congestive heart failure    Naproxen Other (See Comments)    539m dose made her sleep for two days   Rosiglitazone Hives   Tomato Itching   Hydrocodone-Acetaminophen Itching   Hydrocortisone Hives and Itching    Patient Measurements:   Heparin Dosing Weight: 100kg  Vital Signs: BP: 120/54 (10/23 1415) Pulse Rate: 144 (10/23 1415)  Labs: Recent Labs    07/25/22 1320 07/25/22 1325  HGB 12.5 14.3  HCT 41.6 42.0  PLT 275  --   CREATININE 1.74*  --   TROPONINIHS 49*  --     Estimated Creatinine Clearance: 34.3 mL/min (A) (by C-G formula based on SCr of 1.74 mg/dL (H)).   Medical History: Past Medical History:  Diagnosis Date   ATN (acute tubular necrosis) ([facility] 07/15/2014   Automatic implantable cardioverter-defibrillator in situ    Automatic implantable cardioverter-defibrillator in situ 05/04/2007   Qualifier: Diagnosis of  By: CIsla Pence    Cardiac defibrillator in situ    Atlas II VR (SJM) implanted by Dr TLovena Le  CHF (congestive heart failure) ([facility]    Chronic combined systolic and diastolic heart failure ([place]    a. EF 35-40% in past;  b. Echo 7/13:  EF 45-50%, Gr 2 diast dysfn, mild AI, mild MAC, trivial MR, mild LAE, PASP 47.   Chronic ulcer of leg ([facility]    04-09-15 resolved-not a problem.   Colon polyps 04/12/2013   Rectosigmoid polyp   COPD (chronic obstructive pulmonary disease) (HCC)    O2 at night   Depression    Diabetes mellitus    Diabetes mellitus, type 2 ([place] 04/03/2012   HIGH RISK FEET.. Please have patient take shoes and socks off every visit for visual foot inspection.     Eczema    Elevated alkaline phosphatase level    GGT and 5'nucleotidase 8/13 normal   Health care maintenance 01/22/2013   Surgically absent cervix- no pap needed (Path report 07/2000: uterine body  with attached bilateral  adnexa and separate cervix.)   History of oxygen administration    oxygen @ 2 l/m nasally bedtime 24/7   HTN (hypertension)    Hx of cardiac cath    a. [facility] normal;  b. LHC 6/13:  Mild calcification in the LM, o/w normal coronary arteries, EF 45%.    Hyperkalemia 08/08/2017   Hyperlipidemia    Elevated triglyceride in 2019   Hyperlipidemia associated with type 2 diabetes mellitus ([facility] 05/25/2007   Qualifier: Diagnosis of  By: MHassell DoneFNP, [place]     Hyperthyroidism, subclinical    HYPERTHYROIDISM, SUBCLINICAL 05/06/2009   Qualifier: Diagnosis of  By: MDarrick Meigs    Implantable cardioverter-defibrillator (ICD) generator end of life    Lipoma    Need for hepatitis C screening test 04/25/2019   NICM (nonischemic cardiomyopathy) ([place]    Obesity    On home oxygen therapy    "2L; 24/7" (10/11/2016)   Post menopausal syndrome    Shortness of breath 07/14/2017   Skin rash 07/12/2018   Sleep apnea    pt denies 04/12/2013    Assessment: 89yoF admitted with CHF and AFL. No AC PTA, pharmacy to start IV heparin.CBC wnl on admit.  Goal of Therapy:  Heparin level 0.3-0.7 units/ml Monitor platelets by anticoagulation protocol: Yes   Plan:  -Heparin 4000 units  x1 -Heparin 1400 units/hr -Check 6-hr heparin level -Monitor heparin level, CBC, S/Sx bleeding daily  Arrie Senate, PharmD, BCPS, [facility] Clinical Pharmacist Please check AMION for all [place] numbers 07/25/2022

## 2022-07-25 NOTE — Progress Notes (Addendum)
   Remain in A flutter RVR. On heparin drip. Off bipap on 4 liters [place]  Given 80 mg IV lasix with 200 cc urine output.   Give another 80 mg IV lasix x1.  Give 150 mg bolus followed by amio drip   Lactic acid pending.    Tianah Lonardo NP-C

## 2022-07-25 NOTE — ED Notes (Signed)
IV team at bedside 

## 2022-07-25 NOTE — Progress Notes (Signed)
Pt in no distress at this time requiring bipap.  Rt will cont to monitor.

## 2022-07-26 ENCOUNTER — Other Ambulatory Visit (HOSPITAL_COMMUNITY): Payer: Self-pay

## 2022-07-26 ENCOUNTER — Other Ambulatory Visit: Payer: Self-pay

## 2022-07-26 ENCOUNTER — Telehealth (HOSPITAL_COMMUNITY): Payer: Self-pay

## 2022-07-26 ENCOUNTER — Other Ambulatory Visit (HOSPITAL_COMMUNITY): Payer: Medicare Other

## 2022-07-26 ENCOUNTER — Inpatient Hospital Stay: Payer: Self-pay

## 2022-07-26 ENCOUNTER — Inpatient Hospital Stay (HOSPITAL_COMMUNITY): Payer: Medicare Other

## 2022-07-26 DIAGNOSIS — I4892 Unspecified atrial flutter: Secondary | ICD-10-CM | POA: Diagnosis not present

## 2022-07-26 DIAGNOSIS — I5023 Acute on chronic systolic (congestive) heart failure: Secondary | ICD-10-CM | POA: Diagnosis not present

## 2022-07-26 DIAGNOSIS — I10 Essential (primary) hypertension: Secondary | ICD-10-CM | POA: Diagnosis not present

## 2022-07-26 DIAGNOSIS — N184 Chronic kidney disease, stage 4 (severe): Secondary | ICD-10-CM | POA: Diagnosis present

## 2022-07-26 DIAGNOSIS — E1169 Type 2 diabetes mellitus with other specified complication: Secondary | ICD-10-CM | POA: Diagnosis present

## 2022-07-26 DIAGNOSIS — G9341 Metabolic encephalopathy: Secondary | ICD-10-CM

## 2022-07-26 DIAGNOSIS — R609 Edema, unspecified: Secondary | ICD-10-CM

## 2022-07-26 DIAGNOSIS — J449 Chronic obstructive pulmonary disease, unspecified: Secondary | ICD-10-CM | POA: Diagnosis present

## 2022-07-26 DIAGNOSIS — R570 Cardiogenic shock: Secondary | ICD-10-CM | POA: Diagnosis not present

## 2022-07-26 DIAGNOSIS — E669 Obesity, unspecified: Secondary | ICD-10-CM | POA: Diagnosis present

## 2022-07-26 DIAGNOSIS — N179 Acute kidney failure, unspecified: Secondary | ICD-10-CM | POA: Diagnosis not present

## 2022-07-26 DIAGNOSIS — I509 Heart failure, unspecified: Secondary | ICD-10-CM | POA: Insufficient documentation

## 2022-07-26 DIAGNOSIS — N189 Chronic kidney disease, unspecified: Secondary | ICD-10-CM | POA: Diagnosis present

## 2022-07-26 HISTORY — DX: Metabolic encephalopathy: G93.41

## 2022-07-26 LAB — CBC
HCT: 35.1 % — ABNORMAL LOW (ref 36.0–46.0)
Hemoglobin: 10.9 g/dL — ABNORMAL LOW (ref 12.0–15.0)
MCH: 26.3 pg (ref 26.0–34.0)
MCHC: 31.1 g/dL (ref 30.0–36.0)
MCV: 84.8 fL (ref 80.0–100.0)
Platelets: 199 10*3/uL (ref 150–400)
RBC: 4.14 MIL/uL (ref 3.87–5.11)
RDW: 19.9 % — ABNORMAL HIGH (ref 11.5–15.5)
WBC: 7.8 10*3/uL (ref 4.0–10.5)
nRBC: 0 % (ref 0.0–0.2)

## 2022-07-26 LAB — I-STAT ARTERIAL BLOOD GAS, ED
Acid-Base Excess: 3 mmol/L — ABNORMAL HIGH (ref 0.0–2.0)
Bicarbonate: 29.1 mmol/L — ABNORMAL HIGH (ref 20.0–28.0)
Calcium, Ion: 1.12 mmol/L — ABNORMAL LOW (ref 1.15–1.40)
HCT: 38 % (ref 36.0–46.0)
Hemoglobin: 12.9 g/dL (ref 12.0–15.0)
O2 Saturation: 98 %
Patient temperature: 97.4
Potassium: 4.5 mmol/L (ref 3.5–5.1)
Sodium: 135 mmol/L (ref 135–145)
TCO2: 31 mmol/L (ref 22–32)
pCO2 arterial: 49.5 mmHg — ABNORMAL HIGH (ref 32–48)
pH, Arterial: 7.374 (ref 7.35–7.45)
pO2, Arterial: 100 mmHg (ref 83–108)

## 2022-07-26 LAB — GLUCOSE, CAPILLARY
Glucose-Capillary: 324 mg/dL — ABNORMAL HIGH (ref 70–99)
Glucose-Capillary: 368 mg/dL — ABNORMAL HIGH (ref 70–99)

## 2022-07-26 LAB — BASIC METABOLIC PANEL
Anion gap: 10 (ref 5–15)
BUN: 35 mg/dL — ABNORMAL HIGH (ref 8–23)
CO2: 28 mmol/L (ref 22–32)
Calcium: 8.3 mg/dL — ABNORMAL LOW (ref 8.9–10.3)
Chloride: 98 mmol/L (ref 98–111)
Creatinine, Ser: 1.95 mg/dL — ABNORMAL HIGH (ref 0.44–1.00)
GFR, Estimated: 28 mL/min — ABNORMAL LOW (ref >60)
Glucose, Bld: 413 mg/dL — ABNORMAL HIGH (ref 70–99)
Potassium: 5 mmol/L (ref 3.5–5.1)
Sodium: 136 mmol/L (ref 135–145)

## 2022-07-26 LAB — HEPARIN LEVEL (UNFRACTIONATED)
Heparin Unfractionated: 0.16 IU/mL — ABNORMAL LOW (ref 0.30–0.70)
Heparin Unfractionated: 0.7 IU/mL (ref 0.30–0.70)

## 2022-07-26 LAB — COOXEMETRY PANEL
Carboxyhemoglobin: 1.4 % (ref 0.5–1.5)
Methemoglobin: 1.1 % (ref 0.0–1.5)
O2 Saturation: 66.8 %
Total hemoglobin: 11.1 g/dL — ABNORMAL LOW (ref 12.0–16.0)

## 2022-07-26 LAB — CBG MONITORING, ED
Glucose-Capillary: 436 mg/dL — ABNORMAL HIGH (ref 70–99)
Glucose-Capillary: 345 mg/dL — ABNORMAL HIGH (ref 70–99)

## 2022-07-26 LAB — LACTIC ACID, PLASMA
Lactic Acid, Venous: 3 mmol/L (ref 0.5–1.9)
Lactic Acid, Venous: 3.8 mmol/L (ref 0.5–1.9)
Lactic Acid, Venous: 3.7 mmol/L (ref 0.5–1.9)

## 2022-07-26 MED ORDER — NOREPINEPHRINE 4 MG/250ML-% IV SOLN: 2.0000 ug/min | INTRAVENOUS | Status: DC

## 2022-07-26 MED ORDER — SODIUM CHLORIDE 0.9% FLUSH: 10.0000 mL | INTRAVENOUS | Status: DC | PRN

## 2022-07-26 MED ORDER — DOBUTAMINE IN D5W 4-5 MG/ML-% IV SOLN
2.5000 ug/kg/min | INTRAVENOUS | Status: DC
  Administered 2022-07-26 – 2022-07-29 (×2): 2.5 ug/kg/min via INTRAVENOUS
  Filled 2022-07-26 (×2): qty 250

## 2022-07-26 MED ORDER — HEPARIN BOLUS VIA INFUSION
2000.0000 [IU] | Freq: Once | INTRAVENOUS | Status: AC
  Administered 2022-07-26: 2000 [IU] via INTRAVENOUS
  Filled 2022-07-26: qty 2000

## 2022-07-26 MED ORDER — METOLAZONE 2.5 MG PO TABS
2.5000 mg | ORAL_TABLET | Freq: Once | ORAL | Status: AC
  Administered 2022-07-26: 2.5 mg via ORAL
  Filled 2022-07-26: qty 1

## 2022-07-26 MED ORDER — CHLORHEXIDINE GLUCONATE CLOTH 2 % EX PADS
6.0000 | MEDICATED_PAD | Freq: Every day | CUTANEOUS | Status: DC
  Administered 2022-07-27 – 2022-08-03 (×8): 6 via TOPICAL

## 2022-07-26 MED ORDER — MILRINONE LACTATE IN DEXTROSE 20-5 MG/100ML-% IV SOLN
0.2500 ug/kg/min | INTRAVENOUS | Status: DC
  Filled 2022-07-26: qty 100

## 2022-07-26 MED ORDER — SODIUM CHLORIDE 0.9% FLUSH
10.0000 mL | Freq: Two times a day (BID) | INTRAVENOUS | Status: DC
  Administered 2022-07-27 – 2022-08-03 (×8): 10 mL

## 2022-07-26 MED ORDER — SODIUM CHLORIDE 0.9 % IV SOLN: 250.0000 mL | INTRAVENOUS | Status: DC

## 2022-07-26 NOTE — Assessment & Plan Note (Addendum)
Calculate BMI 35, 4

## 2022-07-26 NOTE — Inpatient Diabetes Management (Addendum)
Inpatient Diabetes Program Recommendations  AACE/ADA: New Consensus Statement on Inpatient Glycemic Control (2015)  Target Ranges:  Prepandial:   less than 140 mg/dL      Peak postprandial:   less than 180 mg/dL (1-2 hours)      Critically ill patients:  140 - 180 mg/dL   Lab Results  Component Value Date   GLUCAP 436 (H) 07/26/2022   HGBA1C 9.7 (H) 06/17/2022    Review of Glycemic Control  Latest Reference Range & Units 07/25/22 19:53 07/25/22 21:42 07/26/22 07:41  Glucose-Capillary 70 - 99 mg/dL 224 (H) 339 (H) 436 (H)   Diabetes history: DM 2 Outpatient Diabetes medications: Farxiga 10 mg Daily Current orders for Inpatient glycemic control:  Semglee 10 units Daily Novolog 0-15 units tid + hs  Solumedrol 40 mg q12 hours A1c 9.7% on 9/15  Per MD notes, Pt experienced hypoglycemia and was d/c'd from  insulin since last A1c check. Glucose on presentation was 311 prior to steroids. Pt has been on Tresiba 22 units back in September.  Inpatient Diabetes Program Recommendations:    -  Increase Semglee to 20 units -  Add Novolog 4 units tid meal coverage if eating >50% of meals.  Will speak with pt today regarding A1c and evaluate home trends.   Addendum 1:30 pm: Spoke with pt at bedside regarding her A1c of 9.7% and her not being on insulin at home. Pt reports that she was taken off of insulin due to hypoglycemia on Tresiba 22 units. Pt reports dose was never decreased but was completely discontinued. Pt reports after she was taken off insulin, Her fasting glucose trends increased into the 300 range and has been that way the last 2 weeks. Discussed with pt she may need a portion of basal insulin at home. Pt reports having some Tresiba left over at home if she needs it. Pt currently on steroids inpatient, she will need to follow her trends closely outpatient when she returns home. Discussed A1c and glucose trend goals. Encouraged pt to cal her PCP when she sees and increase in her  glucose trends like that.  Thanks,  Tama Headings RN, MSN, BC-ADM Inpatient Diabetes Coordinator Team Pager [phone] (8a-5p)

## 2022-07-26 NOTE — Assessment & Plan Note (Addendum)
Fasting glucose this am 116, Plan to continue close glucose cover and monitoring with insulin sliding scale.

## 2022-07-26 NOTE — Progress Notes (Signed)
PT Cancellation Note  Patient Details Name: [name] MRN: [id number] DOB: 1951-03-11   Cancelled Treatment:    Reason Eval/Treat Not Completed: Other (comment) Went to check on pt in ED around 1510 and she had just moved to room; tried in room but pt busy with nursing.  Returned 1645 and RN present to start new lines, labs, meds.  Will f/u at later time as able. Abran Richard, PT Acute Rehab [facility] Rehab [phone]   Karlton Lemon 07/26/2022, 4:46 PM

## 2022-07-26 NOTE — TOC Benefit Eligibility Note (Signed)
Patient Teacher, English as a foreign language completed.    The patient is currently admitted and upon discharge could be taking Eliquis 5 mg.  The current 30 day co-pay is $0.00.   The patient is currently admitted and upon discharge could be taking Xarelto 20 mg.  The current 30 day co-pay is $0.00.   The patient is insured through [place], [place] Patient Advocate Specialist [place] Patient Advocate Team Direct Number: [phone]  Fax: [phone]

## 2022-07-26 NOTE — Progress Notes (Signed)
Bilateral lower extremity venous duplex completed. Refer to "CV Proc" under chart review to view preliminary results.  07/26/2022 9:34 AM Kelby Aline., MHA, RVT, RDCS, RDMS

## 2022-07-26 NOTE — Hospital Course (Addendum)
Mrs. Schenk was admitted to the hospital with the working diagnosis of decompensated heart failure.   71 yo female with the past medical history of COPD, heart failure, T2DM, and obesity who presented with dyspnea, wheezing and edema. Recent treatment for cellulitis with antibiotic therapy for 4 weeks, In the ED she was found in respiratory distress and was placed on Bipap. Blood pressure 139/96, HR 124, RR 27 and 02 saturation 100%, lungs with diffuse wheezing and rales bilaterally with increased work of breathing, heart with S1 and S2 present and irregularly irregular, abdomen with no distention, positive lower extremity edema.   Na 138, K 4,8 Cl 99, bicarbonate 30 glucose 311 bun 34 cr 1,74  BNP 461  High sensitive troponin 49 and 48  Lactic acid 2,9  Wbc 8,5 hgb 12.5 plt 275  Sars covid 19 negative   Chest radiograph with cardiomegaly with mild hilar vascular congestion, fluid in the right fissure, pacer defibrillator in place.   EKG 141 bpm, normal axis, qtc 566, atrial flutter rhythm with variable block, low voltage with no significant ST segment or T wave changes.   Placed on amiodarone for atrial flutter rate and rhythm control.  Patient developed low output heart failure, and required dobutamine infusion started on 10/24. 10/25 furosemide drip.  10/30 discontinued dobutamine.  11/02 transitioned to oral amiodarone

## 2022-07-26 NOTE — ED Notes (Signed)
RN instructed patient to attempt and keep both arm straight for continuous infusions. Patient verbalized understanding.

## 2022-07-26 NOTE — Assessment & Plan Note (Addendum)
Encephalopathy due to cardiogenic shock, it has resolved, patient is awake and alert.

## 2022-07-26 NOTE — Progress Notes (Signed)
Progress Note   Patient: [name] SEG:[id number] DOB: [birth year] DOA: 07/25/2022     1 DOS: the patient was seen and examined on 07/26/2022   Brief hospital course: [name] was admitted to the hospital with the working diagnosis of decompensated heart failure.   71 yo female with the past medical history of COPD, heart failure, T2DM, and obesity who presented with dyspnea, wheezing and edema. Recent treatment for cellulitis with antibiotic therapy for 4 weeks, In the ED she was found in respiratory distress and was placed on Bipap. Blood pressure 139/96, HR 124, RR 27 and 02 saturation 100%, lungs with diffuse wheezing and rales bilaterally with increased work of breathing, heart with S1 and S2 present and irregularly irregular, abdomen with no distention, positive lower extremity edema.   Na 138, K 4,8 Cl 99, bicarbonate 30 glucose 311 bun 34 cr 1,74  BNP 461  High sensitive troponin 49 and 48  Lactic acid 2,9  Wbc 8,5 hgb 12.5 plt 275  Sars covid 19 negative   Chest radiograph with cardiomegaly with mild hilar vascular congestion, fluid in the right fissure, pacer defibrillator in place.   EKG 141 bpm, normal axis, qtc 566, atrial flutter rhythm with variable block, low voltage with no significant ST segment or T wave changes.       Assessment and Plan: * Acute on chronic systolic CHF (congestive heart failure) (HCC) Echocardiogram with reduced LV systolic function EF 25 to 30%, septal dyskinesis, RV with mild reduction in systolic function, moderate dilatation of RA, moderate TR.   Systolic blood pressure 98 to 113 mmHg Patient with encephalopathy and elevated lactic acid.  Plan to continue diuresis with furosemide IV  She likely will need inotropic support Picc line placement. Will discuss with heart failure team.   Atrial flutter with rapid ventricular response ([name]) Patient with RVR, low EF with hypotension  Patient will benefit of rate control with  amiodarone Continue anticoagulation.  Continue telemetry monitoring.   Acute kidney injury superimposed on chronic kidney disease (HCC) CKD stage 3a  Renal function today with serum cr at 1.95 with K at 5,0 and serum bicarbonate at 28. Plan to continue diuresis with furosemide Avoid hypotension and nephrotoxic medications She may need vasopressors.   Essential hypertension Continue close blood pressure monitoring, she may need vasopressors.   Acute metabolic encephalopathy Patient somnolent and difficult to arouse, able to answer simple questions Likely multifactorial encephalopathy, possible related to hypoperfusion  Plan to continue close neuro checks Check ABG  Continue hemodynamic support.   COPD (chronic obstructive pulmonary disease) ([place]) Patient with decreased air movement but not wheezing Continue oxymetry monitoring Continue with bronchodilator therapy No clinical signs of acute exacerbation, stop on systemic corticosteroids.  Check ABG   Type 2 diabetes mellitus with hyperlipidemia (HCC) Uncontrolled hyperglycemia.  Fasting glucose this am 413.  Patient with encephalopathy and poor oral intake  For now continue with basal insulin 10 units and sliding scale.  Per her home medications review she is not on insulin therapy at home.  Stop systemic corticosteroids.    Obesity (BMI 30-39.9) Calculate BMI         Subjective: patient somnolent and difficult to arouse, she has been somnolent through this morning   Physical Exam: Vitals:   07/26/22 0915 07/26/22 0930 07/26/22 0945 07/26/22 0947  BP: 113/74  113/72   Pulse:  66    Resp: (!) 23 (!) 27 (!) 23   Temp:   (!) 97.4 F (36.3  C) (!) 97.4 F (36.3 C)  TempSrc:    Oral  SpO2:  100% 100%    Neurology somnolent and difficult to arouse, she able to respond to yes and no questions ENT with mild pallor Cardiovascular with S1 and S2 present, no gallops, or rubs, positive systolic murmur at the lower  right sternal border. Respiratory with rales at bases with no wheezing, poor air movement and poor inspiratory effort Abdomen protuberant but not tender Positive lower extremity edema ++  Data Reviewed:    Family Communication: no family at the bedside   Disposition: Status is: Inpatient Remains inpatient appropriate because: heart failure   Planned Discharge Destination: Home     Critical care time 60 minutes.  Patient critically will with low output heart failure will need vasopressor therapy and IV amiodarone   Author: Tawni Millers, MD 07/26/2022 10:22 AM  For on call review [email or web address].

## 2022-07-26 NOTE — ED Notes (Signed)
MD aware of patient bp running low and hr maintaining in 120s. Patient has had no changes in mental status. Awaiting new orders.  Patient resting, denies any pain or needs at this time.

## 2022-07-26 NOTE — ED Notes (Signed)
Pt alert, oriented, eating lunch

## 2022-07-26 NOTE — Assessment & Plan Note (Addendum)
Patient is now off inotropic support Continue close blood pressure monitoring.

## 2022-07-26 NOTE — Progress Notes (Signed)
Peripherally Inserted Central Catheter Placement  The IV Nurse has discussed with the patient and/or persons authorized to consent for the patient, the purpose of this procedure and the potential benefits and risks involved with this procedure.  The benefits include less needle sticks, lab draws from the catheter, and the patient may be discharged home with the catheter. Risks include, but not limited to, infection, bleeding, blood clot (thrombus formation), and puncture of an artery; nerve damage and irregular heartbeat and possibility to perform a PICC exchange if needed/ordered by physician.  Alternatives to this procedure were also discussed.  Bard Power PICC patient education guide, fact sheet on infection prevention and patient information card has been provided to patient /or left at bedside.    PICC Placement Documentation  PICC Double Lumen 07/26/22 Right Brachial 37 cm 0 cm (Active)  Indication for Insertion or Continuance of Line Vasoactive infusions 07/26/22 1557  Exposed Catheter (cm) 0 cm 07/26/22 1557  Site Assessment Clean, Dry, Intact 07/26/22 1557  Lumen #1 Status Flushed;Blood return noted;Saline locked 07/26/22 1557  Lumen #2 Status Flushed;Blood return noted;Saline locked 07/26/22 1557  Dressing Type Transparent 07/26/22 1557  Dressing Status Antimicrobial disc in place 07/26/22 [place] Connections checked and tightened 07/26/22 1557  Dressing Intervention New dressing 07/26/22 1557  Dressing Change Due 08/02/22 07/26/22 1557       Scotty Court 07/26/2022, 4:00 PM

## 2022-07-26 NOTE — ED Notes (Signed)
Cardiologist contacted, per MD Humphrey Rolls keep pt bp above 85 systolic. Requesting for a PICC line to draw new blood samples from for repeat levels.   IV team consulted and stated "PICC lines are done by dayshift.", hospitalist aware.

## 2022-07-26 NOTE — ED Notes (Signed)
Called [phone] requesting purple man

## 2022-07-26 NOTE — Assessment & Plan Note (Addendum)
Echocardiogram with reduced LV systolic function EF 25 to 30%, septal dyskinesis, RV with mild reduction in systolic function, moderate dilatation of RA, moderate TR.   Urine output not documented Balance is negative 11,305 ml since admission.  Systolic blood pressure 366 to 126 mmHg,   11/01 cardiac catheterization  RA 9 RV 55/10 PA 44/11 (mean 23) PCWP 9 Cardiac output 6,1 and injdex 3,2 (fick) PVR 2,3   Diuresis on hold, and inotropic has been weaned off. Limited pharmacologic options due to risk of hypotension and worsening renal failure.

## 2022-07-26 NOTE — Progress Notes (Signed)
ANTICOAGULATION CONSULT NOTE  Pharmacy Consult for heparin Indication: atrial fibrillation  Allergies  Allergen Reactions   Actos [Pioglitazone] Other (See Comments)    congestive heart failure    Naproxen Other (See Comments)    536m dose made her sleep for two days   Rosiglitazone Hives   Tomato Itching   Hydrocodone-Acetaminophen Itching   Hydrocortisone Hives and Itching    Patient Measurements:   Heparin Dosing Weight: 100kg  Vital Signs: Temp: 98.8 F (37.1 C) (10/23 2030) Temp Source: Oral (10/23 2030) BP: 118/73 (10/24 0215) Pulse Rate: 100 (10/24 0130)  Labs: Recent Labs    07/25/22 1320 07/25/22 1325 07/25/22 1600 07/26/22 0314  HGB 12.5 14.3  --  10.9*  HCT 41.6 42.0  --  35.1*  PLT 275  --   --  199  HEPARINUNFRC  --   --   --  0.16*  CREATININE 1.74*  --   --  1.95*  TROPONINIHS 49*  --  48*  --      Estimated Creatinine Clearance: 30.6 mL/min (A) (by C-G formula based on SCr of 1.95 mg/dL (H)).   Medical History: Past Medical History:  Diagnosis Date   ATN (acute tubular necrosis) ([place] 07/15/2014   Automatic implantable cardioverter-defibrillator in situ    Automatic implantable cardioverter-defibrillator in situ 05/04/2007   Qualifier: Diagnosis of  By: CIsla Pence    Cardiac defibrillator in situ    Atlas II VR ([facility]) implanted by Dr TLovena Le  CHF (congestive heart failure) ([facility]    Chronic combined systolic and diastolic heart failure ([facility]    a. EF 35-40% in past;  b. Echo 7/13:  EF 45-50%, Gr 2 diast dysfn, mild AI, mild MAC, trivial MR, mild LAE, PASP 47.   Chronic ulcer of leg ([facility]    04-09-15 resolved-not a problem.   Colon polyps 04/12/2013   Rectosigmoid polyp   COPD (chronic obstructive pulmonary disease) (HCC)    O2 at night   Depression    Diabetes mellitus    Diabetes mellitus, type 2 ([facility] 04/03/2012   HIGH RISK FEET.. Please have patient take shoes and socks off every visit for visual foot inspection.     Eczema     Elevated alkaline phosphatase level    GGT and 5'nucleotidase 8/13 normal   Health care maintenance 01/22/2013   Surgically absent cervix- no pap needed (Path report 07/2000: uterine body with attached bilateral  adnexa and separate cervix.)   History of oxygen administration    oxygen @ 2 l/m nasally bedtime 24/7   HTN (hypertension)    Hx of cardiac cath    a. [id number] normal;  b. LHC 6/13:  Mild calcification in the LM, o/w normal coronary arteries, EF 45%.    Hyperkalemia 08/08/2017   Hyperlipidemia    Elevated triglyceride in 2019   Hyperlipidemia associated with type 2 diabetes mellitus ([facility] 05/25/2007   Qualifier: Diagnosis of  By: MHassell DoneFNP, [place]     Hyperthyroidism, subclinical    HYPERTHYROIDISM, SUBCLINICAL 05/06/2009   Qualifier: Diagnosis of  By: MDarrick Meigs    Implantable cardioverter-defibrillator (ICD) generator end of life    Lipoma    Need for hepatitis C screening test 04/25/2019   NICM (nonischemic cardiomyopathy) ([facility]    Obesity    On home oxygen therapy    "2L; 24/7" (10/11/2016)   Post menopausal syndrome    Shortness of breath 07/14/2017   Skin rash 07/12/2018   Sleep apnea  pt denies 04/12/2013    Assessment: 63 yoF admitted with CHF and AFL. No AC PTA, pharmacy to start IV heparin.CBC wnl on admit.  10/24 AM update:  Heparin level sub-therapeutic  Goal of Therapy:  Heparin level 0.3-0.7 units/ml Monitor platelets by anticoagulation protocol: Yes   Plan:  Heparin 2000 units bolus Inc heparin to 1600 units/hr 1200 heparin level  Narda Bonds, PharmD, BCPS Clinical Pharmacist Phone: [phone]

## 2022-07-26 NOTE — Assessment & Plan Note (Addendum)
Patient with RVR, low EF with hypotension  Patient has been changed to oral amiodarone  Continue anticoagulation with apixaban,

## 2022-07-26 NOTE — ED Notes (Signed)
ED TO INPATIENT HANDOFF REPORT  ED Nurse Name and Phone #: [phone]  S Name/Age/Gender Kirby Funk 71 y.o. female Room/Bed: 001C/001C  Code Status   Code Status: Full Code  Home/SNF/Other Home  Is this baseline?   Triage Complete: Triage complete  Chief Complaint CHF (congestive heart failure) ([place]) [I50.9] Heart failure ([place]) [I50.9]  Triage Note Pt is arriving from home via [place]. Pt was complaining of SOB since last night. EMS arrived on scene and pt had a RR of 43/minute. Pt spO2 was 92% on RA. EMS originally heard wheezing in the upper chest and rales in the lower. Pt was placed on CPAP for a few minutes and became more alert and responsive. Bilateral swelling of the lower extremities present. Pt has mucous producing cough. Pt was given 62m of albuterol and  of ativan. Pt has hx of COPD, CHF, and asthma.     LVS  BP 150/80  HR 140  RR 36 SpO2 100% on nonrebreather    Allergies Allergies  Allergen Reactions   Actos [Pioglitazone] Other (See Comments)    congestive heart failure    Naproxen Other (See Comments)    5076mdose made her sleep for two days   Rosiglitazone Hives   Tomato Itching   Hydrocodone-Acetaminophen Itching   Hydrocortisone Hives and Itching    Level of Care/Admitting Diagnosis ED Disposition     ED Disposition  Admit   Condition  --   [id number]  Level of Care: ICU [6]  May admit patient to [facility] equivalent level of care is available:: Yes  Covid Evaluation: Asymptomatic - no recent exposure (last 10 days) testing not required  Diagnosis: Heart failure ([place][[id number]]Admitting Physician: ARTawni Millers1[[id number]]Attending Physician: ARTawni Millers1[[id number]]Bed request comments: 2H  Certification:: I certify this patient will need inpatient services for at least 2 midnights          B Medical/Surgery History Past Medical  History:  Diagnosis Date   ATN (acute tubular necrosis) ([place]CM[place]10/13/2015   Automatic implantable cardioverter-defibrillator in situ    Automatic implantable cardioverter-defibrillator in situ 05/04/2007   Qualifier: Diagnosis of  By: CrIsla Pence   Cardiac defibrillator in situ    Atlas II VR (SJM) implanted by Dr TaLovena Le CHF (congestive heart failure) ([place]   Chronic combined systolic and diastolic heart failure ([facility]   a. EF 35-40% in past;  b. Echo 7/13:  EF 45-50%, Gr 2 diast dysfn, mild AI, mild MAC, trivial MR, mild LAE, PASP 47.   Chronic ulcer of leg ([facility]   04-09-15 resolved-not a problem.   Colon polyps 04/12/2013   Rectosigmoid polyp   COPD (chronic obstructive pulmonary disease) (HCC)    O2 at night   Depression    Diabetes mellitus    Diabetes mellitus, type 2 (HCDw[place]7/11/2011   HIGH RISK FEET.. Please have patient take shoes and socks off every visit for visual foot inspection.     Eczema    Elevated alkaline phosphatase level    GGT and 5'nucleotidase 8/13 normal   Health care maintenance 01/22/2013   Surgically absent cervix- no pap needed (Path report 07/2000: uterine body with attached bilateral  adnexa and separate cervix.)   History of oxygen administration    oxygen @ 2 l/m nasally bedtime 24/7   HTN (hypertension)  Hx of cardiac cath    a. [place] 2003 normal;  b. LHC 6/13:  Mild calcification in the LM, o/w normal coronary arteries, EF 45%.    Hyperkalemia 08/08/2017   Hyperlipidemia    Elevated triglyceride in 2019   Hyperlipidemia associated with type 2 diabetes mellitus ([place]) 05/25/2007   Qualifier: Diagnosis of  By: Hassell Done FNP, [place]     Hyperthyroidism, subclinical    HYPERTHYROIDISM, SUBCLINICAL 05/06/2009   Qualifier: Diagnosis of  By: Darrick Meigs     Implantable cardioverter-defibrillator (ICD) generator end of life    Lipoma    Need for hepatitis C screening test 04/25/2019   NICM (nonischemic cardiomyopathy) ([place])    Obesity    On home  oxygen therapy    "2L; 24/7" (10/11/2016)   Post menopausal syndrome    Shortness of breath 07/14/2017   Skin rash 07/12/2018   Sleep apnea    pt denies 04/12/2013   Past Surgical History:  Procedure Laterality Date   ABDOMINAL HYSTERECTOMY     CARDIAC CATHETERIZATION     CARDIAC CATHETERIZATION N/A 06/21/2016   Procedure: Left Heart Cath and Coronary Angiography;  Surgeon: Sherren Mocha, MD;  Location: [place] [facility];  Service: Cardiovascular;  Laterality: N/A;   CARDIAC DEFIBRILLATOR PLACEMENT  05/04/2007   SJM Atlas II VR ICD   CARDIAC DEFIBRILLATOR PLACEMENT     CATARACT EXTRACTION     od   COLONOSCOPY N/A 04/12/2013   Procedure: COLONOSCOPY;  Surgeon: Beryle Beams, MD;  Location: [facility];  Service: Endoscopy;  Laterality: N/A;  pt.has defibrilator   COLONOSCOPY N/A 04/16/2015   Procedure: COLONOSCOPY;  Surgeon: [name], MD;  Location: [facility];  Service: Endoscopy;  Laterality: N/A;   ESOPHAGOGASTRODUODENOSCOPY N/A 04/16/2015   Procedure: ESOPHAGOGASTRODUODENOSCOPY (EGD);  Surgeon: [name], MD;  Location: [facility];  Service: Endoscopy;  Laterality: N/A;   EYE SURGERY     HERNIA REPAIR     HYSTEROSCOPY     IMPLANTABLE CARDIOVERTER DEFIBRILLATOR (ICD) GENERATOR CHANGE N/A 04/02/2014   Procedure: ICD GENERATOR CHANGE;  Surgeon: Evans Lance, MD;  [facility];  Service: Cardiovascular;  Laterality: N/A;   INSERT / REPLACE / REMOVE PACEMAKER     LEFT HEART CATHETERIZATION WITH CORONARY ANGIOGRAM N/A 04/02/2012   Procedure: LEFT HEART CATHETERIZATION WITH CORONARY ANGIOGRAM;  Surgeon: Hillary Bow, MD;  [facility];  Service: Cardiovascular;  Laterality: N/A;   ORIF ANKLE FRACTURE Right 08/12/2019   Procedure: OPEN REDUCTION INTERNAL FIXATION (ORIF) RIGHT TRIMALLEOLAR ANKLE FRACTURE;  Surgeon: Leandrew Koyanagi, MD;  Location: [place];  Service: Orthopedics;  Laterality: Right;   TEE WITHOUT CARDIOVERSION N/A 06/23/2022   Procedure:  TRANSESOPHAGEAL ECHOCARDIOGRAM (TEE);  Surgeon: [name], MD;  Location: [place];  Service: Cardiology;  Laterality: N/A;   TONSILLECTOMY     TUBAL LIGATION       A IV Location/Drains/Wounds Patient Lines/Drains/Airways Status     Active Line/Drains/Airways     Name Placement date Placement time Site Days   Peripheral IV 07/25/22 22 G Right Antecubital 07/25/22  --  Antecubital  1   Peripheral IV 07/25/22 20 G 1.88" Left Antecubital 07/25/22  1604  Antecubital  1   Peripheral IV 07/26/22 22 G 1.75" Anterior;Right;Lateral Forearm 07/26/22  0224  Forearm  less than 1   External Urinary Catheter 06/27/22  2045  --  29   Pressure Injury 06/17/22 Buttocks Medial;Left Stage 2 -  Partial thickness loss of  dermis presenting as a shallow open injury with a red, pink wound bed without slough. 2 small areas of skin break down. 06/17/22  1830  -- 39            Intake/Output Last 24 hours  Intake/Output Summary (Last 24 hours) at 07/26/2022 1146 Last data filed at 07/26/2022 9470 Gross per 24 hour  Intake 306.7 ml  Output 700 ml  Net -393.3 ml    Labs/Imaging Results for orders placed or performed during the hospital encounter of 07/25/22 (from the past 48 hour(s))  Magnesium     Status: None   Collection Time: 07/25/22  1:18 PM  Result Value Ref Range   Magnesium 2.1 1.7 - 2.4 mg/dL    Comment: Performed at [facility], [place] [address] [place], [place] [address]  Comprehensive metabolic panel     Status: Abnormal   Collection Time: 07/25/22  1:20 PM  Result Value Ref Range   Sodium 138 135 - 145 mmol/L   Potassium 4.8 3.5 - 5.1 mmol/L   Chloride 99 98 - 111 mmol/L   CO2 30 22 - 32 mmol/L   Glucose, Bld 311 (H) 70 - 99 mg/dL    Comment: Glucose reference range applies only to samples taken after fasting for at least 8 hours.   BUN 34 (H) 8 - 23 mg/dL   Creatinine, Ser 1.74 (H) 0.44 - 1.00 mg/dL   Calcium 8.8 (L) 8.9 - 10.3 mg/dL   Total Protein 7.4 6.5 -  8.1 g/dL   Albumin 2.8 (L) 3.5 - 5.0 g/dL   AST 45 (H) 15 - 41 U/L   ALT 36 0 - 44 U/L   Alkaline Phosphatase 266 (H) 38 - 126 U/L   Total Bilirubin 1.6 (H) 0.3 - 1.2 mg/dL   GFR, Estimated 32 (L) >60 mL/min    Comment: (NOTE) Calculated using the CKD-EPI Creatinine Equation (2021)    Anion gap 9 5 - 15    Comment: Performed at [facility], [place] [address], [place], [place] [address]  CBC with Differential/Platelet     Status: Abnormal   Collection Time: 07/25/22  1:20 PM  Result Value Ref Range   WBC 8.5 4.0 - 10.5 K/uL   RBC 4.86 3.87 - 5.11 MIL/uL   Hemoglobin 12.5 12.0 - 15.0 g/dL   HCT 41.6 36.0 - 46.0 %   MCV 85.6 80.0 - 100.0 fL   MCH 25.7 (L) 26.0 - 34.0 pg   MCHC 30.0 30.0 - 36.0 g/dL   RDW 20.4 (H) 11.5 - 15.5 %   Platelets 275 150 - 400 K/uL   nRBC 0.0 0.0 - 0.2 %   Neutrophils Relative % 74 %   Neutro Abs 6.3 1.7 - 7.7 K/uL   Lymphocytes Relative 16 %   Lymphs Abs 1.3 0.7 - 4.0 K/uL   Monocytes Relative 8 %   Monocytes Absolute 0.7 0.1 - 1.0 K/uL   Eosinophils Relative 1 %   Eosinophils Absolute 0.1 0.0 - 0.5 K/uL   Basophils Relative 1 %   Basophils Absolute 0.1 0.0 - 0.1 K/uL   Immature Granulocytes 0 %   Abs Immature Granulocytes 0.03 0.00 - 0.07 K/uL    Comment: Performed at [facility] [address] [place], [place] [address]  Troponin I (High Sensitivity)     Status: Abnormal   Collection Time: 07/25/22  1:20 PM  Result Value Ref Range   Troponin I (High Sensitivity) 49 (  H) <18 ng/L    Comment: (NOTE) Elevated high sensitivity troponin I (hsTnI) values and significant  changes across serial measurements may suggest ACS but many other  chronic and acute conditions are known to elevate hsTnI results.  Refer to the "Links" section for chest pain algorithms and additional  guidance. Performed at [facility], [place] [address], [place], [place] [address]   Resp Panel by RT-PCR (Flu A&B, Covid) Anterior Nasal Swab     Status: None    Collection Time: 07/25/22  1:21 PM   Specimen: Anterior Nasal Swab  Result Value Ref Range   SARS Coronavirus 2 by RT PCR NEGATIVE NEGATIVE    Comment: (NOTE) SARS-CoV-2 target nucleic acids are NOT DETECTED.  The SARS-CoV-2 RNA is generally detectable in upper respiratory specimens during the acute phase of infection. The lowest concentration of SARS-CoV-2 viral copies this assay can detect is 138 copies/mL. A negative result does not preclude SARS-Cov-2 infection and should not be used as the sole basis for treatment or other patient management decisions. A negative result may occur with  improper specimen collection/handling, submission of specimen other than nasopharyngeal swab, presence of viral mutation(s) within the areas targeted by this assay, and inadequate number of viral copies(<138 copies/mL). A negative result must be combined with clinical observations, patient history, and epidemiological information. The expected result is Negative.  Fact Sheet for Patients:  EntrepreneurPulse.com.au  Fact Sheet for Healthcare Providers:  IncredibleEmployment.be  This test is no t yet approved or cleared by the [place] FDA and  has been authorized for detection and/or diagnosis of SARS-CoV-2 by FDA under an Emergency Use Authorization (EUA). This EUA will remain  in effect (meaning this test can be used) for the duration of the COVID-19 declaration under Section 564(b)(1) of the Act, 21 U.S.C.section 360bbb-3(b)(1), unless the authorization is terminated  or revoked sooner.       Influenza A by PCR NEGATIVE NEGATIVE   Influenza B by PCR NEGATIVE NEGATIVE    Comment: (NOTE) The Xpert Xpress SARS-CoV-2/FLU/RSV plus assay is intended as an aid in the diagnosis of influenza from Nasopharyngeal swab specimens and should not be used as a sole basis for treatment. Nasal washings and aspirates are unacceptable for Xpert Xpress  SARS-CoV-2/FLU/RSV testing.  Fact Sheet for Patients: EntrepreneurPulse.com.au  Fact Sheet for Healthcare Providers: IncredibleEmployment.be  This test is not yet approved or cleared by the [place] FDA and has been authorized for detection and/or diagnosis of SARS-CoV-2 by FDA under an Emergency Use Authorization (EUA). This EUA will remain in effect (meaning this test can be used) for the duration of the COVID-19 declaration under Section 564(b)(1) of the Act, 21 U.S.C. section 360bbb-3(b)(1), unless the authorization is terminated or revoked.  Performed at [facility], [place] [address], [place], [place] [address]   Brain natriuretic peptide     Status: Abnormal   Collection Time: 07/25/22  1:21 PM  Result Value Ref Range   B Natriuretic Peptide 461.6 (H) 0.0 - 100.0 pg/mL    Comment: Performed at [facility] [address], [place], [place] [address]  I-Stat venous blood gas, ED     Status: Abnormal   Collection Time: 07/25/22  1:25 PM  Result Value Ref Range   pH, Ven 7.483 (H) 7.25 - 7.43   pCO2, Ven 40.7 (L) 44 - 60 mmHg   pO2, Ven 35 32 - 45 mmHg   Bicarbonate 30.5 (H) 20.0 - 28.0 mmol/L  TCO2 32 22 - 32 mmol/L   O2 Saturation 72 %   Acid-Base Excess 6.0 (H) 0.0 - 2.0 mmol/L   Sodium 138 135 - 145 mmol/L   Potassium 4.9 3.5 - 5.1 mmol/L   Calcium, Ion 1.01 (L) 1.15 - 1.40 mmol/L   HCT 42.0 36.0 - 46.0 %   Hemoglobin 14.3 12.0 - 15.0 g/dL   Sample type VENOUS    Comment NOTIFIED PHYSICIAN   Troponin I (High Sensitivity)     Status: Abnormal   Collection Time: 07/25/22  4:00 PM  Result Value Ref Range   Troponin I (High Sensitivity) 48 (H) <18 ng/L    Comment: (NOTE) Elevated high sensitivity troponin I (hsTnI) values and significant  changes across serial measurements may suggest ACS but many other  chronic and acute conditions are known to elevate hsTnI results.  Refer to the "Links" section for chest pain  algorithms and additional  guidance. Performed at [facility], [place] [address] [place], [place] [address]   Lactic acid, plasma     Status: Abnormal   Collection Time: 07/25/22  4:00 PM  Result Value Ref Range   Lactic Acid, Venous 2.9 (HH) 0.5 - 1.9 mmol/L    Comment: CRITICAL RESULT CALLED TO, READ BACK BY AND VERIFIED WITH B. COBIRE RN 07/25/22 _0  BY J. WHITE Performed at [facility] [address] [place], [place] [address]   CBG monitoring, ED     Status: Abnormal   Collection Time: 07/25/22  7:53 PM  Result Value Ref Range   Glucose-Capillary 224 (H) 70 - 99 mg/dL    Comment: Glucose reference range applies only to samples taken after fasting for at least 8 hours.  Lactic acid, plasma     Status: Abnormal   Collection Time: 07/25/22  8:23 PM  Result Value Ref Range   Lactic Acid, Venous 3.8 (HH) 0.5 - 1.9 mmol/L    Comment: CRITICAL VALUE NOTED.  VALUE IS CONSISTENT WITH PREVIOUSLY REPORTED AND CALLED VALUE. Performed at [facility], [place] [address], [place], [place] [address]   CBG monitoring, ED     Status: Abnormal   Collection Time: 07/25/22  9:42 PM  Result Value Ref Range   Glucose-Capillary 339 (H) 70 - 99 mg/dL    Comment: Glucose reference range applies only to samples taken after fasting for at least 8 hours.  Heparin level (unfractionated)     Status: Abnormal   Collection Time: 07/26/22  3:14 AM  Result Value Ref Range   Heparin Unfractionated 0.16 (L) 0.30 - 0.70 IU/mL    Comment: (NOTE) The clinical reportable range upper limit is being lowered to >1.10 to align with the FDA approved guidance for the current laboratory assay.  If heparin results are below expected values, and patient dosage has  been confirmed, suggest follow up testing of antithrombin III levels. Performed at [facility], [place] [address], [place], [place] [address]   CBC     Status: Abnormal   Collection Time: 07/26/22  3:14 AM  Result Value Ref Range    WBC 7.8 4.0 - 10.5 K/uL   RBC 4.14 3.87 - 5.11 MIL/uL   Hemoglobin 10.9 (L) 12.0 - 15.0 g/dL   HCT 35.1 (L) 36.0 - 46.0 %   MCV 84.8 80.0 - 100.0 fL   MCH 26.3 26.0 - 34.0 pg   MCHC 31.1 30.0 - 36.0 g/dL   RDW 19.9 (H) 11.5 - 15.5 %  Platelets 199 150 - 400 K/uL   nRBC 0.0 0.0 - 0.2 %    Comment: Performed at [facility], [place] [address] [place], [place] [address]  Basic metabolic panel     Status: Abnormal   Collection Time: 07/26/22  3:14 AM  Result Value Ref Range   Sodium 136 135 - 145 mmol/L   Potassium 5.0 3.5 - 5.1 mmol/L   Chloride 98 98 - 111 mmol/L   CO2 28 22 - 32 mmol/L   Glucose, Bld 413 (H) 70 - 99 mg/dL    Comment: Glucose reference range applies only to samples taken after fasting for at least 8 hours.   BUN 35 (H) 8 - 23 mg/dL   Creatinine, Ser 1.95 (H) 0.44 - 1.00 mg/dL   Calcium 8.3 (L) 8.9 - 10.3 mg/dL   GFR, Estimated 28 (L) >60 mL/min    Comment: (NOTE) Calculated using the CKD-EPI Creatinine Equation (2021)    Anion gap 10 5 - 15    Comment: Performed at [facility] [address] [place], [place] [address]  Lactic acid, plasma     Status: Abnormal   Collection Time: 07/26/22  5:37 AM  Result Value Ref Range   Lactic Acid, Venous 3.0 (HH) 0.5 - 1.9 mmol/L    Comment: CRITICAL VALUE NOTED. VALUE IS CONSISTENT WITH PREVIOUSLY REPORTED/CALLED VALUE Performed at [facility], [place] [address] [place], [place] [address]   CBG monitoring, ED     Status: Abnormal   Collection Time: 07/26/22  7:41 AM  Result Value Ref Range   Glucose-Capillary 436 (H) 70 - 99 mg/dL    Comment: Glucose reference range applies only to samples taken after fasting for at least 8 hours.  Lactic acid, plasma     Status: Abnormal   Collection Time: 07/26/22  9:41 AM  Result Value Ref Range   Lactic Acid, Venous 3.8 (HH) 0.5 - 1.9 mmol/L    Comment: CRITICAL VALUE NOTED. VALUE IS CONSISTENT WITH PREVIOUSLY REPORTED/CALLED VALUE Performed at [facility], [place] [address], [place],  [address]   I-Stat arterial blood gas, ED     Status: Abnormal   Collection Time: 07/26/22 10:46 AM  Result Value Ref Range   pH, Arterial 7.374 7.35 - 7.45   pCO2 arterial 49.5 (H) 32 - 48 mmHg   pO2, Arterial 100 83 - 108 mmHg   Bicarbonate 29.1 (H) 20.0 - 28.0 mmol/L   TCO2 31 22 - 32 mmol/L   O2 Saturation 98 %   Acid-Base Excess 3.0 (H) 0.0 - 2.0 mmol/L   Sodium 135 135 - 145 mmol/L   Potassium 4.5 3.5 - 5.1 mmol/L   Calcium, Ion 1.12 (L) 1.15 - 1.40 mmol/L   HCT 38.0 36.0 - 46.0 %   Hemoglobin 12.9 12.0 - 15.0 g/dL   Patient temperature 97.4 F    Collection site RADIAL, ALLEN'S TEST ACCEPTABLE    Drawn by RT    Sample type ARTERIAL    *Note: Due to a large number of results and/or encounters for the requested time period, some results have not been displayed. A complete set of results can be found in Results Review.   VAS [place] LOWER EXTREMITY VENOUS (DVT)  Result Date: 07/26/2022  Lower Venous DVT Study Patient Name:  ADESSA PRIMIANO  Date of Exam:   07/26/2022 Medical Rec #: [id number]     Accession #:    [id number] Date of Birth:  Mar 11, 1951    Patient Gender: F Patient Age:   67 years Exam Location:  [facility] Procedure:      VAS [place] LOWER EXTREMITY VENOUS (DVT) Referring Phys: Wynetta Fines --------------------------------------------------------------------------------  Indications: Edema.  Limitations: Poor ultrasound/tissue interface, body habitus and unable to obtain long axis image of left popliteal vein secondary to patient body habitus. Comparison Study: 06/17/2022- negative right lower extremity venous duplex Performing Technologist: Maudry Mayhew MHA, RDMS, RVT, RDCS  Examination Guidelines: A complete evaluation includes B-mode imaging, spectral Doppler, color Doppler, and power Doppler as needed of all accessible portions of each vessel. Bilateral testing is considered an integral part of a complete examination. Limited  examinations for reoccurring indications may be performed as noted. The reflux portion of the exam is performed with the patient in reverse Trendelenburg.  +---------+---------------+---------+-----------+---------------+--------------+ RIGHT    CompressibilityPhasicitySpontaneityProperties     Thrombus Aging +---------+---------------+---------+-----------+---------------+--------------+ CFV      Full           No       Yes                                      +---------+---------------+---------+-----------+---------------+--------------+ SFJ      Full                                                             +---------+---------------+---------+-----------+---------------+--------------+ FV Prox  Full                    Yes                                      +---------+---------------+---------+-----------+---------------+--------------+ FV Mid                           Yes        patent by color                                                           Doppler                       +---------+---------------+---------+-----------+---------------+--------------+ FV Distal                        Yes        patent by color                                                           Doppler                       +---------+---------------+---------+-----------+---------------+--------------+ POP      Full  No       Yes                                      +---------+---------------+---------+-----------+---------------+--------------+ PTV                              Yes        patent by color                                                           Doppler                       +---------+---------------+---------+-----------+---------------+--------------+   Right Technical Findings: Not visualized segments include PFV, peroneal veins, limited visualiazation PTV.   +---------+---------------+---------+-----------+---------------+--------------+ LEFT     CompressibilityPhasicitySpontaneityProperties     Thrombus Aging +---------+---------------+---------+-----------+---------------+--------------+ CFV      Full           No       Yes                                      +---------+---------------+---------+-----------+---------------+--------------+ SFJ      Full                                                             +---------+---------------+---------+-----------+---------------+--------------+ FV Prox  Full                                                             +---------+---------------+---------+-----------+---------------+--------------+ FV Mid                           Yes        patent by color                                                           Doppler                       +---------+---------------+---------+-----------+---------------+--------------+ FV Distal               No       Yes        patent by color  and PW Doppler                +---------+---------------+---------+-----------+---------------+--------------+ POP                              Yes        patent by color                                                           Doppler                       +---------+---------------+---------+-----------+---------------+--------------+   Left Technical Findings: Not visualized segments include PFV, PTV, peroneal veins.   Summary: RIGHT: - There is no evidence of deep vein thrombosis in the lower extremity. However, portions of this examination were limited- see technologist comments above.  - No cystic structure found in the popliteal fossa.  LEFT: - There is no evidence of deep vein thrombosis in the lower extremity. However, portions of this examination were limited- see technologist comments above.  - No cystic  structure found in the popliteal fossa.  Pulsatile lower extremity venous flow is suggestive of possibly elevated right heart pressure.  *See table(s) above for measurements and observations.    Preliminary    [place] EKG SITE RITE  Result Date: 07/26/2022 If Site Rite image not attached, placement could not be confirmed due to current cardiac rhythm.  CT Angio Chest PE W and/or Wo Contrast  Result Date: 07/25/2022 CLINICAL DATA:  Shortness of breath, cough, leg swelling, history COPD, CHF, asthma, question pulmonary embolism EXAM: CT ANGIOGRAPHY CHEST WITH CONTRAST TECHNIQUE: Multidetector CT imaging of the chest was performed using the standard protocol during bolus administration of intravenous contrast. Multiplanar CT image reconstructions and MIPs were obtained to evaluate the vascular anatomy. RADIATION DOSE REDUCTION: This exam was performed according to the departmental dose-optimization program which includes automated exposure control, adjustment of the mA and/or kV according to patient size and/or use of iterative reconstruction technique. CONTRAST:  62m OMNIPAQUE IOHEXOL 350 MG/ML SOLN IV COMPARISON:  CT chest 11/14/2018 FINDINGS: Cardiovascular: Scattered atherosclerotic calcifications aorta, proximal great vessels, and coronary arteries. Aorta normal caliber. LEFT subclavian ICD lead within RIGHT ventricle. Enlargement of cardiac chambers. No pericardial effusion. Pulmonary arteries adequately opacified and patent. No evidence of pulmonary embolism. Mediastinum/Nodes: Normal sized axillary lymph nodes. No thoracic adenopathy. Esophagus unremarkable. Base of cervical region normal appearance. Lungs/Pleura: Trace BILATERAL pleural effusions. Scattered mosaic attenuation in both lungs similar to prior exam. Minimal bibasilar atelectasis. No acute infiltrate, pleural effusion, or pneumothorax. Few tiny nodular foci in RIGHT upper lobe appear grossly unchanged. Upper Abdomen: Reflux of contrast into  IVC and hepatic veins question elevated RIGHT heart pressure. Calcified granuloma superiorly in liver versus diaphragmatic unchanged. Remaining visualized upper abdomen unremarkable. Musculoskeletal: Scattered subcutaneous and soft tissue edema. No acute osseous findings. Review of the MIP images confirms the above findings. IMPRESSION: No evidence of pulmonary embolism. Scattered mosaic attenuation in both lungs with minimal bibasilar atelectasis and trace BILATERAL pleural effusions. Scattered atherosclerotic calcifications including coronary arteries. Enlargement of cardiac chambers with question elevated RIGHT heart pressures. Aortic Atherosclerosis (ICD10-I70.0). Electronically Signed   By: MLavonia DanaM.D.   On: 07/25/2022 19:03   DG Chest Port 1  View  Result Date: 07/25/2022 CLINICAL DATA:  Shortness of breath. EXAM: PORTABLE CHEST 1 VIEW COMPARISON:  06/17/2022 FINDINGS: 1345 hours. The cardio pericardial silhouette is enlarged. The lungs are clear without focal pneumonia, edema, pneumothorax or pleural effusion. Interstitial markings are diffusely coarsened with chronic features. Bones are diffusely demineralized. Telemetry leads overlie the chest. IMPRESSION: Enlarged cardiopericardial silhouette without acute cardiopulmonary findings. Electronically Signed   By: Misty Stanley M.D.   On: 07/25/2022 13:51    Pending Labs Unresulted Labs (From admission, onward)     Start     Ordered   07/26/22 1200  Heparin level (unfractionated)  Once-Timed,   TIMED       Question:  Specimen collection method  Answer:  IV Team=IV Team collect   07/26/22 0353   07/26/22 0934  Blood gas, arterial  Once,   R        07/26/22 0933   07/26/22 0918  Lactic acid, plasma  STAT Now then every 3 hours,   R (with STAT occurrences)     Question:  Specimen collection method  Answer:  IV Team=IV Team collect   07/26/22 0918   07/26/22 0500  Heparin level (unfractionated)  Daily at 5am,   R      07/25/22 1444    07/26/22 0500  CBC  Daily at 5am,   R      07/25/22 1444   07/26/22 2979  Basic metabolic panel  Daily at 5am,   R     Comments: As Scheduled for 5 days   Question:  Specimen collection method  Answer:  IV Team=IV Team collect   07/25/22 1711   07/26/22 8921  Basic metabolic panel  Daily at 5am,   R     Comments: While on milrinone.   Question:  Specimen collection method  Answer:  IV Team=IV Team collect   07/25/22 1735   07/26/22 0500  Cooxemetry Panel (carboxy, met, total hgb, O2 sat)  Daily at 5am,   R     Question:  Specimen collection method  Answer:  IV Team=IV Team collect   07/25/22 1735   07/25/22 2300  Heparin level (unfractionated)  Once-Timed,   URGENT        07/25/22 1444   07/25/22 2036  Cooxemetry Panel (carboxy, met, total hgb, O2 sat)  Once,   R       Question:  Specimen collection method  Answer:  IV Team=IV Team collect   07/25/22 1735            Vitals/Pain Today's Vitals   07/26/22 1045 07/26/22 1100 07/26/22 1115 07/26/22 1121  BP: 109/71 95/70 112/80   Pulse:   61   Resp: _0 Temp:      TempSrc:      SpO2:   100%   PainSc:    0-No pain    Isolation Precautions Airborne and Contact precautions  Medications Medications  heparin ADULT infusion 100 units/mL (25000 units/275m) (1,600 Units/hr Intravenous Rate/Dose Change 07/26/22 0405)  aspirin EC tablet 81 mg (81 mg Oral Given 07/26/22 0944)  atorvastatin (LIPITOR) tablet 80 mg (80 mg Oral Given 07/26/22 0943)  umeclidinium-vilanterol (ANORO ELLIPTA) 62.5-25 MCG/ACT 1 puff (1 puff Inhalation Given 07/26/22 0945)  sodium chloride flush (NS) 0.9 % injection 3 mL (3 mLs Intravenous Not Given 07/26/22 0946)  sodium chloride flush (NS) 0.9 % injection 3 mL (has no administration in time range)  0.9 %  sodium chloride infusion (has no  administration in time range)  ondansetron (ZOFRAN) injection 4 mg (has no administration in time range)  insulin aspart (novoLOG) injection 0-15 Units (15 Units  Subcutaneous Given 07/26/22 0822)  insulin aspart (novoLOG) injection 0-5 Units (4 Units Subcutaneous Given 07/25/22 2154)  insulin glargine-yfgn (SEMGLEE) injection 10 Units (10 Units Subcutaneous Given 07/26/22 0945)  levalbuterol (XOPENEX) nebulizer solution 0.63 mg ( Inhalation Canceled Entry 07/26/22 0756)  nicotine (NICODERM CQ - dosed in mg/24 hours) patch 21 mg (21 mg Transdermal Patient Refused/Not Given 07/26/22 0946)  furosemide (LASIX) injection 80 mg (80 mg Intravenous Given 07/26/22 0755)  amiodarone (NEXTERONE PREMIX) 360-4.14 MG/200ML-% (1.8 mg/mL) IV infusion (0 mg/hr Intravenous Stopped 07/25/22 2237)    Followed by  amiodarone (NEXTERONE PREMIX) 360-4.14 MG/200ML-% (1.8 mg/mL) IV infusion (60 mg/hr Intravenous New Bag/Given 07/26/22 1143)  levalbuterol (XOPENEX) nebulizer solution 0.63 mg (0.63 mg Nebulization Given 07/26/22 0757)  0.9 %  sodium chloride infusion (0 mLs Intravenous Hold 07/26/22 0932)  DOBUTamine (DOBUTREX) infusion 4000 mcg/mL (2.5 mcg/kg/min  100.7 kg Intravenous New Bag/Given 07/26/22 1055)  albuterol (PROVENTIL) (2.5 MG/3ML) 0.083% nebulizer solution (10 mg/hr Nebulization Given 07/25/22 1338)  heparin bolus via infusion 4,000 Units (4,000 Units Intravenous Bolus from Bag 07/25/22 1748)  amiodarone (NEXTERONE) 1.8 mg/mL load via infusion 150 mg (150 mg Intravenous Bolus from Bag 07/25/22 1853)  iohexol (OMNIPAQUE) 350 MG/ML injection 60 mL (60 mLs Intravenous Contrast Given 07/25/22 1833)  heparin bolus via infusion 2,000 Units (2,000 Units Intravenous Bolus from Bag 07/26/22 0406)  metolazone (ZAROXOLYN) tablet 2.5 mg (2.5 mg Oral Given 07/26/22 0946)    Mobility non-ambulatory Low fall risk   Focused Assessments    R Recommendations: See Admitting Provider Note  Report given to:   Additional Notes: pt very lethargic, Dr. Cathlean Sauer aware

## 2022-07-26 NOTE — ED Notes (Signed)
Pt very lethargic, rousable briefly to voice, difficulty answering questions, falls aback asleep  quickly; Dr. Cathlean Sauer notified; no new orders; will continue to monitor as able

## 2022-07-26 NOTE — Progress Notes (Signed)
ANTICOAGULATION CONSULT NOTE  Pharmacy Consult for heparin Indication: atrial fibrillation  Allergies  Allergen Reactions   Actos [Pioglitazone] Other (See Comments)    congestive heart failure    Naproxen Other (See Comments)    540m dose made her sleep for two days   Rosiglitazone Hives   Tomato Itching   Hydrocodone-Acetaminophen Itching   Hydrocortisone Hives and Itching    Patient Measurements:   Heparin Dosing Weight: 100kg  Vital Signs: Temp: 97.2 F (36.2 C) (10/24 1529) Temp Source: Oral (10/24 1529) BP: 120/62 (10/24 1529) Pulse Rate: 58 (10/24 1529)  Labs: Recent Labs    07/25/22 1320 07/25/22 1325 07/25/22 1600 07/26/22 0314 07/26/22 1046 07/26/22 1338  HGB 12.5 14.3  --  10.9* 12.9  --   HCT 41.6 42.0  --  35.1* 38.0  --   PLT 275  --   --  199  --   --   HEPARINUNFRC  --   --   --  0.16*  --  0.70  CREATININE 1.74*  --   --  1.95*  --   --   TROPONINIHS 49*  --  48*  --   --   --      Estimated Creatinine Clearance: 30.6 mL/min (A) (by C-G formula based on SCr of 1.95 mg/dL (H)).   Medical History: Past Medical History:  Diagnosis Date   ATN (acute tubular necrosis) ([facility] 07/15/2014   Automatic implantable cardioverter-defibrillator in situ    Automatic implantable cardioverter-defibrillator in situ 05/04/2007   Qualifier: Diagnosis of  By: CIsla Pence    Cardiac defibrillator in situ    Atlas II VR (SJM) implanted by Dr TLovena Le  CHF (congestive heart failure) ([facility]    Chronic combined systolic and diastolic heart failure ([facility]    a. EF 35-40% in past;  b. Echo 7/13:  EF 45-50%, Gr 2 diast dysfn, mild AI, mild MAC, trivial MR, mild LAE, PASP 47.   Chronic ulcer of leg ([facility]    04-09-15 resolved-not a problem.   Colon polyps 04/12/2013   Rectosigmoid polyp   COPD (chronic obstructive pulmonary disease) (HCC)    O2 at night   Depression    Diabetes mellitus    Diabetes mellitus, type 2 ([facility] 04/03/2012   HIGH RISK FEET.. Please have  patient take shoes and socks off every visit for visual foot inspection.     Eczema    Elevated alkaline phosphatase level    GGT and 5'nucleotidase 8/13 normal   Health care maintenance 01/22/2013   Surgically absent cervix- no pap needed (Path report 07/2000: uterine body with attached bilateral  adnexa and separate cervix.)   History of oxygen administration    oxygen @ 2 l/m nasally bedtime 24/7   HTN (hypertension)    Hx of cardiac cath    a. [id number] normal;  b. LHC 6/13:  Mild calcification in the LM, o/w normal coronary arteries, EF 45%.    Hyperkalemia 08/08/2017   Hyperlipidemia    Elevated triglyceride in 2019   Hyperlipidemia associated with type 2 diabetes mellitus ([facility] 05/25/2007   Qualifier: Diagnosis of  By: MHassell DoneFNP, [place]     Hyperthyroidism, subclinical    HYPERTHYROIDISM, SUBCLINICAL 05/06/2009   Qualifier: Diagnosis of  By: MDarrick Meigs    Implantable cardioverter-defibrillator (ICD) generator end of life    Lipoma    Need for hepatitis C screening test 04/25/2019   NICM (nonischemic cardiomyopathy) ([facility]    Obesity  On home oxygen therapy    "2L; 24/7" (10/11/2016)   Post menopausal syndrome    Shortness of breath 07/14/2017   Skin rash 07/12/2018   Sleep apnea    pt denies 04/12/2013    Assessment: 24 yoF admitted with CHF and new Aflutter . No AC PTA, pharmacy to start IV heparin.CBC wnl on admit.  Heparin drip 1600 uts/hr with heparin level 0.7 at upper end of goal  Cbc stable no bleeding noted will decrease drip rate. Possible procedures no po anticoagulation yet.    Goal of Therapy:  Heparin level 0.3-0.7 units/ml Monitor platelets by anticoagulation protocol: Yes   Plan:  Decrease heparin drip rate 1500 units/hr  Daily heparin level and cbc     Bonnita Nasuti Pharm.D. CPP, BCPS Clinical Pharmacist [phone] 07/26/2022 3:47 PM

## 2022-07-26 NOTE — Progress Notes (Addendum)
Advanced Heart Failure Rounding Note  PCP-Cardiologist: None   Subjective:    Admitted with A/C HFrEF and new onset A flutter. Started on amio + heparin.   Sluggish diuresis with IV lasix.   Lactic acid 3.8>3 Creatinine 1.7>1.95  Complaining shortness of breath.    Objective:   Weight Range:   There is no height or weight on file to calculate BMI.   Vital Signs:   Temp:  [98 F (36.7 C)-99 F (37.2 C)] 98 F (36.7 C) (10/24 0400) Pulse Rate:  [62-146] 117 (10/24 0800) Resp:  [0-32] 22 (10/24 0800) BP: (81-151)/(54-110) 105/65 (10/24 0800) SpO2:  [95 %-100 %] 99 % (10/24 0800) FiO2 (%):  [40 %] 40 % (10/23 1341)    Weight change: There were no vitals filed for this visit.  Intake/Output:   Intake/Output Summary (Last 24 hours) at 07/26/2022 0828 Last data filed at 07/26/2022 0226 Gross per 24 hour  Intake 306.7 ml  Output --  Net 306.7 ml      Physical Exam    General:   No resp difficulty HEENT: Normal Neck: Supple. JVP to jaw  . Carotids 2+ bilat; no bruits. No lymphadenopathy or thyromegaly appreciated. Cor: PMI nondisplaced. Tach Regular rate & rhythm. No rubs, gallops or murmurs. Lungs: Clear Abdomen: Soft, nontender, nondistended. No hepatosplenomegaly. No bruits or masses. Good bowel sounds. Extremities: No cyanosis, clubbing, rash,  RLE and LLE 2+ edema Neuro: Alert & orientedx3, cranial nerves grossly intact. moves all 4 extremities w/o difficulty. Affect flat  Telemetry   A flutter 90-110s personally checked   EKG     A flutter 98  personally checked.  Labs    CBC Recent Labs    07/25/22 1320 07/25/22 1325 07/26/22 0314  WBC 8.5  --  7.8  NEUTROABS 6.3  --   --   HGB 12.5 14.3 10.9*  HCT 41.6 42.0 35.1*  MCV 85.6  --  84.8  PLT 275  --  193   Basic Metabolic Panel Recent Labs    07/25/22 1318 07/25/22 1320 07/25/22 1320 07/25/22 1325 07/26/22 0314  NA  --  138   < > 138 136  K  --  4.8   < > 4.9 5.0  CL  --  99   --   --  98  CO2  --  30  --   --  28  GLUCOSE  --  311*  --   --  413*  BUN  --  34*  --   --  35*  CREATININE  --  1.74*  --   --  1.95*  CALCIUM  --  8.8*  --   --  8.3*  MG 2.1  --   --   --   --    < > = values in this interval not displayed.   Liver Function Tests Recent Labs    07/25/22 1320  AST 45*  ALT 36  ALKPHOS 266*  BILITOT 1.6*  PROT 7.4  ALBUMIN 2.8*   No results for input(s): "LIPASE", "AMYLASE" in the last 72 hours. Cardiac Enzymes No results for input(s): "CKTOTAL", "CKMB", "CKMBINDEX", "TROPONINI" in the last 72 hours.  BNP: BNP (last 3 results) Recent Labs    06/26/22 1445 07/13/22 1609 07/25/22 1321  BNP 418.1* 358.7* 461.6*    ProBNP (last 3 results) No results for input(s): "PROBNP" in the last 8760 hours.   D-Dimer No results for input(s): "DDIMER" in the last 72  hours. Hemoglobin A1C No results for input(s): "HGBA1C" in the last 72 hours. Fasting Lipid Panel No results for input(s): "CHOL", "HDL", "LDLCALC", "TRIG", "CHOLHDL", "LDLDIRECT" in the last 72 hours. Thyroid Function Tests No results for input(s): "TSH", "T4TOTAL", "T3FREE", "THYROIDAB" in the last 72 hours.  Invalid input(s): "FREET3"  Other results:   Imaging    CT Angio Chest PE W and/or Wo Contrast  Result Date: 07/25/2022 CLINICAL DATA:  Shortness of breath, cough, leg swelling, history COPD, CHF, asthma, question pulmonary embolism EXAM: CT ANGIOGRAPHY CHEST WITH CONTRAST TECHNIQUE: Multidetector CT imaging of the chest was performed using the standard protocol during bolus administration of intravenous contrast. Multiplanar CT image reconstructions and MIPs were obtained to evaluate the vascular anatomy. RADIATION DOSE REDUCTION: This exam was performed according to the departmental dose-optimization program which includes automated exposure control, adjustment of the mA and/or kV according to patient size and/or use of iterative reconstruction technique. CONTRAST:   60m OMNIPAQUE IOHEXOL 350 MG/ML SOLN IV COMPARISON:  CT chest 11/14/2018 FINDINGS: Cardiovascular: Scattered atherosclerotic calcifications aorta, proximal great vessels, and coronary arteries. Aorta normal caliber. LEFT subclavian ICD lead within RIGHT ventricle. Enlargement of cardiac chambers. No pericardial effusion. Pulmonary arteries adequately opacified and patent. No evidence of pulmonary embolism. Mediastinum/Nodes: Normal sized axillary lymph nodes. No thoracic adenopathy. Esophagus unremarkable. Base of cervical region normal appearance. Lungs/Pleura: Trace BILATERAL pleural effusions. Scattered mosaic attenuation in both lungs similar to prior exam. Minimal bibasilar atelectasis. No acute infiltrate, pleural effusion, or pneumothorax. Few tiny nodular foci in RIGHT upper lobe appear grossly unchanged. Upper Abdomen: Reflux of contrast into IVC and hepatic veins question elevated RIGHT heart pressure. Calcified granuloma superiorly in liver versus diaphragmatic unchanged. Remaining visualized upper abdomen unremarkable. Musculoskeletal: Scattered subcutaneous and soft tissue edema. No acute osseous findings. Review of the MIP images confirms the above findings. IMPRESSION: No evidence of pulmonary embolism. Scattered mosaic attenuation in both lungs with minimal bibasilar atelectasis and trace BILATERAL pleural effusions. Scattered atherosclerotic calcifications including coronary arteries. Enlargement of cardiac chambers with question elevated RIGHT heart pressures. Aortic Atherosclerosis (ICD10-I70.0). Electronically Signed   By: MLavonia DanaM.D.   On: 07/25/2022 19:03   DG Chest Port 1 View  Result Date: 07/25/2022 CLINICAL DATA:  Shortness of breath. EXAM: PORTABLE CHEST 1 VIEW COMPARISON:  06/17/2022 FINDINGS: 1345 hours. The cardio pericardial silhouette is enlarged. The lungs are clear without focal pneumonia, edema, pneumothorax or pleural effusion. Interstitial markings are diffusely  coarsened with chronic features. Bones are diffusely demineralized. Telemetry leads overlie the chest. IMPRESSION: Enlarged cardiopericardial silhouette without acute cardiopulmonary findings. Electronically Signed   By: EMisty StanleyM.D.   On: 07/25/2022 13:51     Medications:     Scheduled Medications:  aspirin EC  81 mg Oral Daily   atorvastatin  80 mg Oral Daily   furosemide  80 mg Intravenous BID   insulin aspart  0-15 Units Subcutaneous TID WC   insulin aspart  0-5 Units Subcutaneous QHS   insulin glargine-yfgn  10 Units Subcutaneous Daily   levalbuterol  0.63 mg Nebulization TID   methylPREDNISolone (SOLU-MEDROL) injection  40 mg Intravenous Q12H   midodrine  10 mg Oral TID WC   nicotine  21 mg Transdermal Daily   sodium chloride flush  3 mL Intravenous Q12H   umeclidinium-vilanterol  1 puff Inhalation Daily    Infusions:  sodium chloride     amiodarone 30 mg/hr (07/25/22 2240)   heparin 1,600 Units/hr (07/26/22 0405)    PRN Medications:  sodium chloride, levalbuterol, ondansetron (ZOFRAN) IV, sodium chloride flush    Patient Profile    Ms Farha is a 71 year old with a history of DMII, COPD, HTN, chronic respiratory failure, tobacco abuse, St Jude ICD, and chronic HFrEF. EF has 25-30% for at least 5 years. Last LHC 2017, normal coronaries.    Presented with increased shortness of breath. New onset A flutter and A/C HFrEF with marked volume overload.    Assessment/Plan   1. Acute on Chronic Hypoxic Respiratory Failure --> Multifactorial AECOPD and A/C HFrEF.  -Started on solumedrol. + inhalers. -Chronically on 2-3 liters [place]. Placed on Bipap on arrival.  - Now on nasal cannula.  - Attempt to diurese with IV lasix. Hopefully can transition to nasal cannula with diuresis.  -SARS2 negative CTA - negative PE.    2. A/C HFrEF-->Cardiogenic Shock - NICM LHC 2017 normal cors. -2022 Echo EF 25-30% which has been down for many years. Repeat ECHO.   - Interrogate St Jude  ICD. BNP 461.  - Lactic acid 3.8>3  + worsening renal function. Place PICC and check CO-OX  - May need norepi.  Stop midodrine  - Continue lasix 80 mg twice a day and give 2.5 mg metolazone.  - Hold off GDMT with AKI and decompensation.  - Add unna boots.    3. New Onset A flutter RVR - Of note had TEE 06/2022- no thrombus noted.  -Continue heparin drip. Start eliquis after stabilize.    -May need to add amio but will hold off for now.  - Diurese with IV lasix with eventual TEE/DC-CV. Aim to restore SR.  -If decompensates will need urgent cardioversion.    4. AKI  -Creatinine baseline ~ 1.2  -On admit 1.7-->today 1.95 -Hold spiro. - Follow daily BMET    5. DMII, uncontrolled  - On steroids. -On SSI    6. Obesity      Discussed with Dr Tempie Hoist and Dr Cathlean Sauer.   Length of Stay: 1  Amy Clegg, NP  07/26/2022, 8:28 AM  Advanced Heart Failure Team Pager [phone] (M-F; 7a - 5p)  Please contact [facility] for night-coverage after hours (5p -7a ) and weekends on [email or web address]  Agree with above.   Lactates elevated overnight. SBP 80-90s. Started on midodrine. SBP now 100-110.   Remains in AFL. Says breathing is better. Denies CP  General:  Weak appearing. No resp difficulty HEENT: normal Neck: supple. JVP to ear  Carotids 2+ bilat; no bruits. No lymphadenopathy or thryomegaly appreciated. Cor: PMI nondisplaced. Irreg tachy No rubs, gallops or murmurs. Lungs: clear Abdomen: soft, nontender, nondistended. No hepatosplenomegaly. No bruits or masses. Good bowel sounds. Extremities: no cyanosis, clubbing, rash, 1+ edema Neuro: alert & orientedx3, cranial nerves grossly intact. moves all 4 extremities w/o difficulty. Affect pleasant  Remains very tenuous. Concern for ongoing low-level shock. Will repeat lactate stat and get PICC. If remains acidotis will need inotrope support and ICU care.   Check echo. Continue IV amio, heparin and midodrine. Eventual TEE/DC-CV.  D/w Dr.  Cathlean Sauer, ED staff and ICU team.   CRITICAL CARE Performed by: Glori Bickers  Total critical care time: 45 minutes  Critical care time was exclusive of separately billable procedures and treating other patients.  Critical care was necessary to treat or prevent imminent or life-threatening deterioration.  Critical care was time spent personally by me (independent of midlevel providers or residents) on the following activities: development of treatment plan with patient and/or surrogate as well as nursing,  discussions with consultants, evaluation of patient's response to treatment, examination of patient, obtaining history from patient or surrogate, ordering and performing treatments and interventions, ordering and review of laboratory studies, ordering and review of radiographic studies, pulse oximetry and re-evaluation of patient's condition.  Glori Bickers, MD  10:03 AM

## 2022-07-26 NOTE — Progress Notes (Signed)
Remote ICD transmission.   

## 2022-07-26 NOTE — Progress Notes (Signed)
   Currently on DBA 2.5 mcg + amio 60 mg + heparin drip.   Given 80 mg IV lasix and metolazone earlier. Negative 700 cc.     A&Ox3. Remains volume overloaded and in A flutter RVR.   Continue current plan. PICC placement pending.   Talisha Erby NP-C

## 2022-07-26 NOTE — Assessment & Plan Note (Addendum)
No clinical signs of exacerbation, continue oxymetry monitoring.  ?

## 2022-07-26 NOTE — Assessment & Plan Note (Addendum)
CKD stage 3a, hypokalemia.   Renal function with serum cr at 3,3 with K at 3,4 and serum bicarbonate at 33. Plan to continue to hold diuretic therapy and follow up renal function in am.

## 2022-07-26 NOTE — ED Notes (Signed)
Patient unable to keep arms straight for IV infusions. IV team consulted for a different access after 2x failed attempts by RN.

## 2022-07-26 NOTE — ED Notes (Signed)
Notified admitting MD that pt's CBG was 435 and SSI only covers through 400. He advised to give 15 units at this time.

## 2022-07-26 NOTE — Telephone Encounter (Signed)
Called and left patient a voice message to confirm/remind patient of their appointment at the [facility] on 07/27/22.

## 2022-07-26 NOTE — Progress Notes (Signed)
Pt is in no distress at this time, BIPAP is not required. RT will continue to monitor.

## 2022-07-26 NOTE — ED Notes (Signed)
RN

## 2022-07-26 NOTE — Progress Notes (Signed)
Heart Failure Navigator Progress Note  Assessed for Heart & Vascular TOC clinic readiness.  Patient does not meet criteria due to Advanced Heart Failure Team patient of Dr. Bensimhon.     Maevyn Riordan, BSN, RN Heart Failure Nurse Navigator Secure Chat Only   

## 2022-07-27 ENCOUNTER — Inpatient Hospital Stay (HOSPITAL_COMMUNITY): Payer: Medicare Other

## 2022-07-27 ENCOUNTER — Encounter (HOSPITAL_COMMUNITY): Payer: Medicare Other

## 2022-07-27 DIAGNOSIS — I5021 Acute systolic (congestive) heart failure: Secondary | ICD-10-CM | POA: Diagnosis not present

## 2022-07-27 DIAGNOSIS — I5023 Acute on chronic systolic (congestive) heart failure: Secondary | ICD-10-CM | POA: Diagnosis not present

## 2022-07-27 LAB — ECHOCARDIOGRAM COMPLETE
Area-P 1/2: 3.91 cm2
MV M vel: 4.78 m/s
MV Peak grad: 91.4 mmHg
MV VTI: 1.28 cm2
S' Lateral: 4.5 cm
Weight: 3556.8 oz

## 2022-07-27 LAB — CBC
HCT: 33 % — ABNORMAL LOW (ref 36.0–46.0)
Hemoglobin: 10.3 g/dL — ABNORMAL LOW (ref 12.0–15.0)
MCH: 26.3 pg (ref 26.0–34.0)
MCHC: 31.2 g/dL (ref 30.0–36.0)
MCV: 84.4 fL (ref 80.0–100.0)
Platelets: 207 10*3/uL (ref 150–400)
RBC: 3.91 MIL/uL (ref 3.87–5.11)
RDW: 19.5 % — ABNORMAL HIGH (ref 11.5–15.5)
WBC: 12.6 10*3/uL — ABNORMAL HIGH (ref 4.0–10.5)
nRBC: 0 % (ref 0.0–0.2)

## 2022-07-27 LAB — HEPARIN LEVEL (UNFRACTIONATED)
Heparin Unfractionated: 0.96 IU/mL — ABNORMAL HIGH (ref 0.30–0.70)
Heparin Unfractionated: 0.8 IU/mL — ABNORMAL HIGH (ref 0.30–0.70)

## 2022-07-27 LAB — GLUCOSE, CAPILLARY
Glucose-Capillary: 190 mg/dL — ABNORMAL HIGH (ref 70–99)
Glucose-Capillary: 214 mg/dL — ABNORMAL HIGH (ref 70–99)
Glucose-Capillary: 190 mg/dL — ABNORMAL HIGH (ref 70–99)
Glucose-Capillary: 119 mg/dL — ABNORMAL HIGH (ref 70–99)

## 2022-07-27 LAB — BASIC METABOLIC PANEL
Anion gap: 12 (ref 5–15)
BUN: 43 mg/dL — ABNORMAL HIGH (ref 8–23)
CO2: 28 mmol/L (ref 22–32)
Calcium: 8.3 mg/dL — ABNORMAL LOW (ref 8.9–10.3)
Chloride: 97 mmol/L — ABNORMAL LOW (ref 98–111)
Creatinine, Ser: 1.97 mg/dL — ABNORMAL HIGH (ref 0.44–1.00)
GFR, Estimated: 27 mL/min — ABNORMAL LOW (ref >60)
Glucose, Bld: 197 mg/dL — ABNORMAL HIGH (ref 70–99)
Potassium: 4.2 mmol/L (ref 3.5–5.1)
Sodium: 137 mmol/L (ref 135–145)

## 2022-07-27 LAB — COOXEMETRY PANEL
Carboxyhemoglobin: 1.7 % — ABNORMAL HIGH (ref 0.5–1.5)
Methemoglobin: <0.7 % (ref 0.0–1.5)
O2 Saturation: 82.7 %
Total hemoglobin: 10.5 g/dL — ABNORMAL LOW (ref 12.0–16.0)

## 2022-07-27 LAB — LACTIC ACID, PLASMA: Lactic Acid, Venous: 1.7 mmol/L (ref 0.5–1.9)

## 2022-07-27 MED ORDER — FUROSEMIDE 10 MG/ML IJ SOLN
10.0000 mg/h | INTRAVENOUS | Status: DC
  Administered 2022-07-27 (×2): 15 mg/h via INTRAVENOUS
  Administered 2022-07-28 – 2022-07-30 (×5): 20 mg/h via INTRAVENOUS
  Administered 2022-07-30: 10 mg/h via INTRAVENOUS
  Filled 2022-07-27 (×10): qty 20

## 2022-07-27 MED ORDER — POTASSIUM CHLORIDE CRYS ER 20 MEQ PO TBCR
40.0000 meq | EXTENDED_RELEASE_TABLET | Freq: Every day | ORAL | Status: DC
  Administered 2022-07-27 – 2022-07-31 (×5): 40 meq via ORAL
  Filled 2022-07-27 (×5): qty 2

## 2022-07-27 MED ORDER — METOLAZONE 5 MG PO TABS
5.0000 mg | ORAL_TABLET | Freq: Once | ORAL | Status: AC
  Administered 2022-07-27: 5 mg via ORAL
  Filled 2022-07-27: qty 1

## 2022-07-27 MED ORDER — FUROSEMIDE 10 MG/ML IJ SOLN
80.0000 mg | Freq: Once | INTRAMUSCULAR | Status: AC
  Administered 2022-07-27: 80 mg via INTRAVENOUS
  Filled 2022-07-27: qty 8

## 2022-07-27 MED ORDER — PERFLUTREN LIPID MICROSPHERE
1.0000 mL | INTRAVENOUS | Status: AC | PRN
  Administered 2022-07-27: 2 mL via INTRAVENOUS

## 2022-07-27 MED ORDER — INSULIN ASPART 100 UNIT/ML IJ SOLN
2.0000 [IU] | Freq: Three times a day (TID) | INTRAMUSCULAR | Status: DC
  Administered 2022-07-27 – 2022-07-30 (×7): 2 [IU] via SUBCUTANEOUS

## 2022-07-27 NOTE — Progress Notes (Signed)
ANTICOAGULATION CONSULT NOTE Pharmacy Consult for heparin Indication: atrial fibrillation  Allergies  Allergen Reactions   Actos [Pioglitazone] Other (See Comments)    congestive heart failure    Naproxen Other (See Comments)    '500mg'$  dose made her sleep for two days   Rosiglitazone Hives   Tomato Itching   Hydrocodone-Acetaminophen Itching   Hydrocortisone Hives and Itching    Patient Measurements: Weight: 100.8 kg (222 lb 4.8 oz) Heparin Dosing Weight: 100kg  Vital Signs: Temp: 97.3 F (36.3 C) (10/25 1217) Temp Source: Oral (10/25 1217) BP: 113/53 (10/25 1217) Pulse Rate: 75 (10/25 1217)  Labs: Recent Labs    07/25/22 1320 07/25/22 1325 07/25/22 1600 07/26/22 0314 07/26/22 1046 07/26/22 1338 07/27/22 0500 07/27/22 1445  HGB 12.5   < >  --  10.9* 12.9  --  10.3*  --   HCT 41.6   < >  --  35.1* 38.0  --  33.0*  --   PLT 275  --   --  199  --   --  207  --   HEPARINUNFRC  --    < >  --  0.16*  --  0.70 0.96* 0.80*  CREATININE 1.74*  --   --  1.95*  --   --  1.97*  --   TROPONINIHS 49*  --  48*  --   --   --   --   --    < > = values in this interval not displayed.     Estimated Creatinine Clearance: 30.4 mL/min (A) (by C-G formula based on SCr of 1.97 mg/dL (H)).  Assessment: 71 y.o. female with Afib for heparin  PM UPDATE: - Heparin level supratherapeutic at 0.8 after rate decrease this AM - No bleeding reported and no issues with heparin infusing per RN  Goal of Therapy:  Heparin level 0.3-0.7 units/ml Monitor platelets by anticoagulation protocol: Yes   Plan:  Decrease Heparin 1150 units/hr Check heparin level in 8 hours.   Dimple Nanas, PharmD, BCPS 07/27/2022 4:04 PM

## 2022-07-27 NOTE — Progress Notes (Signed)
Spoke with Darrick Grinder NP to clarify amiodarone order. Amiodarone drip is ordered for 60 mg/hour, and it is ok to continue at this rate instead of dropping to 30 mg/hr dose.

## 2022-07-27 NOTE — Progress Notes (Signed)
  Echocardiogram 2D Echocardiogram has been performed.  Wynelle Link 07/27/2022, 3:32 PM

## 2022-07-27 NOTE — Progress Notes (Addendum)
PROGRESS NOTE    [name]  PYK:[id number] DOB: [birth year] DOA: 07/25/2022 PCP: [name], MD  71/F with history of chronic systolic CHF, EF 53-97%, COPD, chronic respiratory failure on 2 L home O2, tobacco use, type 2 diabetes mellitus, hypertension presented to the ED with increased shortness of breath. -Noted to be in atrial flutter with volume overload, then complicated by cardiogenic shock -Started amiodarone, converted to sinus -Advanced heart failure team following, started on dobutamine 10/24 and diuretics   Subjective: -Feels so-so, still with dyspnea on exertion, leg pains are better  Assessment and Plan:  Acute on chronic systolic CHF  Cardiogenic shock, low output state Echo EF 25 to 30%, septal dyskinesis, RV with mild reduction in systolic function, moderate dilatation of RA, moderate TR.  -Advanced heart failure team following, started on dobutamine 10/24 with diuretics -Poor response thus far, starting Lasix gtt. and metolazone today -Per heart failure team, GDMT limited by hypotension and AKI -Poor dietary compliance noted as well, eats fast food several times a week and canned food too  Atrial flutter with rapid ventricular response ([place]) -Started on IV amiodarone, in sinus rhythm now, continue IV heparin  Acute kidney injury superimposed on chronic kidney disease ([place]) CKD stage 3a -Worsened in the setting of cardiogenic shock -Monitor, on inotropes now, avoid hypotension  Essential hypertension Continue close blood pressure monitoring, she may need vasopressors.   Acute metabolic encephalopathy -Likely from cardiogenic shock, hypoperfusion -Improving, ABG yesterday without significant hypercarbia, BUN is 43, will check ammonia level  COPD (chronic obstructive pulmonary disease) (H[facility]) -Off systemic steroids, continue Xopenex, Anoro Ellipta  Type 2 diabetes mellitus with hyperlipidemia ([place]) -Poorly controlled, continue glargine, add low-dose meal  coverage.   Obesity (BMI 30-39.9) Calculate BMI    DVT prophylaxis: IV heparin Code Status: Full code Family Communication: None present Disposition Plan:   Consultants: CHF team   Procedures:   Antimicrobials:    Objective: Vitals:   07/26/22 2053 07/26/22 2354 07/27/22 0545 07/27/22 0850  BP:  111/70 (!) 148/69 (!) 123/52  Pulse: 61 64 67 68  Resp: (!) '25 12 17 20  '$ Temp:   (!) 97.3 F (36.3 C) (!) 97.2 F (36.2 C)  TempSrc:   Oral Oral  SpO2: 99% 100% 99% 99%  Weight:   100.8 kg     Intake/Output Summary (Last 24 hours) at 07/27/2022 1101 Last data filed at 07/27/2022 0349 Gross per 24 hour  Intake 955.88 ml  Output --  Net 955.88 ml   Filed Weights   07/27/22 0545  Weight: 100.8 kg    Examination:  General exam: Obese female sitting up in bed, AAOx3, no distress HEENT: Positive JVD Respiratory system: Clear to auscultation Cardiovascular system: S1 & S2 heard, RRR.  Abd: nondistended, soft and nontender.Normal bowel sounds heard. Central nervous system: Alert and oriented. No focal neurological deficits. Extremities: 2 +edema Skin: No rashes Psychiatry:  Mood & affect appropriate.     Data Reviewed:   CBC: Recent Labs  Lab 07/25/22 1320 07/25/22 1325 07/26/22 0314 07/26/22 1046 07/27/22 0500  WBC 8.5  --  7.8  --  12.6*  NEUTROABS 6.3  --   --   --   --   HGB 12.5 14.3 10.9* 12.9 10.3*  HCT 41.6 42.0 35.1* 38.0 33.0*  MCV 85.6  --  84.8  --  84.4  PLT 275  --  199  --  673   Basic Metabolic Panel: Recent Labs  Lab 07/25/22 1318 07/25/22  1320 07/25/22 1325 07/26/22 0314 07/26/22 1046 07/27/22 0500  NA  --  138 138 136 135 137  K  --  4.8 4.9 5.0 4.5 4.2  CL  --  99  --  98  --  97*  CO2  --  30  --  28  --  28  GLUCOSE  --  311*  --  413*  --  197*  BUN  --  34*  --  35*  --  43*  CREATININE  --  1.74*  --  1.95*  --  1.97*  CALCIUM  --  8.8*  --  8.3*  --  8.3*  MG 2.1  --   --   --   --   --    GFR: Estimated  Creatinine Clearance: 30.4 mL/min (A) (by C-G formula based on SCr of 1.97 mg/dL (H)). Liver Function Tests: Recent Labs  Lab 07/25/22 1320  AST 45*  ALT 36  ALKPHOS 266*  BILITOT 1.6*  PROT 7.4  ALBUMIN 2.8*   No results for input(s): "LIPASE", "AMYLASE" in the last 168 hours. No results for input(s): "AMMONIA" in the last 168 hours. Coagulation Profile: No results for input(s): "INR", "PROTIME" in the last 168 hours. Cardiac Enzymes: No results for input(s): "CKTOTAL", "CKMB", "CKMBINDEX", "TROPONINI" in the last 168 hours. BNP (last 3 results) No results for input(s): "PROBNP" in the last 8760 hours. HbA1C: No results for input(s): "HGBA1C" in the last 72 hours. CBG: Recent Labs  Lab 07/26/22 0741 07/26/22 1146 07/26/22 1611 07/26/22 2205 07/27/22 0837  GLUCAP 436* 345* 324* 368* 190*   Lipid Profile: No results for input(s): "CHOL", "HDL", "LDLCALC", "TRIG", "CHOLHDL", "LDLDIRECT" in the last 72 hours. Thyroid Function Tests: No results for input(s): "TSH", "T4TOTAL", "FREET4", "T3FREE", "THYROIDAB" in the last 72 hours. Anemia Panel: No results for input(s): "VITAMINB12", "FOLATE", "FERRITIN", "TIBC", "IRON", "RETICCTPCT" in the last 72 hours. Urine analysis:    Component Value Date/Time   COLORURINE YELLOW 06/26/2022 [place] 06/26/2022 1725   LABSPEC 1.020 06/26/2022 1725   PHURINE 5.5 06/26/2022 1725   GLUCOSEU 100 (A) 06/26/2022 1725   HGBUR NEGATIVE 06/26/2022 1725   HGBUR negative 11/08/2010 0854   BILIRUBINUR NEGATIVE 06/26/2022 1725   KETONESUR NEGATIVE 06/26/2022 1725   PROTEINUR NEGATIVE 06/26/2022 1725   UROBILINOGEN 1.0 01/26/2015 2028   NITRITE NEGATIVE 06/26/2022 1725   LEUKOCYTESUR NEGATIVE 06/26/2022 1725   Sepsis Labs: '@LABRCNTIP'$ (procalcitonin:4,lacticidven:4)  ) Recent Results (from the past 240 hour(s))  Resp Panel by RT-PCR (Flu A&B, Covid) Anterior Nasal Swab     Status: None   Collection Time: 07/25/22  1:21 PM    Specimen: Anterior Nasal Swab  Result Value Ref Range Status   SARS Coronavirus 2 by RT PCR NEGATIVE NEGATIVE Final    Comment: (NOTE) SARS-CoV-2 target nucleic acids are NOT DETECTED.  The SARS-CoV-2 RNA is generally detectable in upper respiratory specimens during the acute phase of infection. The lowest concentration of SARS-CoV-2 viral copies this assay can detect is 138 copies/mL. A negative result does not preclude SARS-Cov-2 infection and should not be used as the sole basis for treatment or other patient management decisions. A negative result may occur with  improper specimen collection/handling, submission of specimen other than nasopharyngeal swab, presence of viral mutation(s) within the areas targeted by this assay, and inadequate number of viral copies(<138 copies/mL). A negative result must be combined with clinical observations, patient history, and epidemiological information. The expected result is Negative.  Fact Sheet for Patients:  EntrepreneurPulse.com.au  Fact Sheet for Healthcare Providers:  IncredibleEmployment.be  This test is no t yet approved or cleared by the [place] FDA and  has been authorized for detection and/or diagnosis of SARS-CoV-2 by FDA under an Emergency Use Authorization (EUA). This EUA will remain  in effect (meaning this test can be used) for the duration of the COVID-19 declaration under Section 564(b)(1) of the Act, 21 U.S.C.section 360bbb-3(b)(1), unless the authorization is terminated  or revoked sooner.       Influenza A by PCR NEGATIVE NEGATIVE Final   Influenza B by PCR NEGATIVE NEGATIVE Final    Comment: (NOTE) The Xpert Xpress SARS-CoV-2/FLU/RSV plus assay is intended as an aid in the diagnosis of influenza from Nasopharyngeal swab specimens and should not be used as a sole basis for treatment. Nasal washings and aspirates are unacceptable for Xpert Xpress  SARS-CoV-2/FLU/RSV testing.  Fact Sheet for Patients: EntrepreneurPulse.com.au  Fact Sheet for Healthcare Providers: IncredibleEmployment.be  This test is not yet approved or cleared by the [place] FDA and has been authorized for detection and/or diagnosis of SARS-CoV-2 by FDA under an Emergency Use Authorization (EUA). This EUA will remain in effect (meaning this test can be used) for the duration of the COVID-19 declaration under Section 564(b)(1) of the Act, 21 U.S.C. section 360bbb-3(b)(1), unless the authorization is terminated or revoked.  Performed at [facility], [place] [address] [place], [place] [address]      Radiology Studies: VAS [place] LOWER EXTREMITY VENOUS (DVT)  Result Date: 07/26/2022  Lower Venous DVT Study Patient Name:  CLIFFORD COUDRIET  Date of Exam:   07/26/2022 Medical Rec #: [id number]     Accession #:    [id number] Date of Birth: 01-Feb-1951    Patient Gender: F Patient Age:   82 years Exam Location:  [facility] Procedure:      VAS [place] LOWER EXTREMITY VENOUS (DVT) Referring Phys: Wynetta Fines --------------------------------------------------------------------------------  Indications: Edema.  Limitations: Poor ultrasound/tissue interface, body habitus and unable to obtain long axis image of left popliteal vein secondary to patient body habitus. Comparison Study: 06/17/2022- negative right lower extremity venous duplex Performing Technologist: Maudry Mayhew MHA, RDMS, RVT, RDCS  Examination Guidelines: A complete evaluation includes B-mode imaging, spectral Doppler, color Doppler, and power Doppler as needed of all accessible portions of each vessel. Bilateral testing is considered an integral part of a complete examination. Limited examinations for reoccurring indications may be performed as noted. The reflux portion of the exam is performed with the patient in reverse Trendelenburg.   +---------+---------------+---------+-----------+---------------+--------------+ RIGHT    CompressibilityPhasicitySpontaneityProperties     Thrombus Aging +---------+---------------+---------+-----------+---------------+--------------+ CFV      Full           No       Yes                                      +---------+---------------+---------+-----------+---------------+--------------+ SFJ      Full                                                             +---------+---------------+---------+-----------+---------------+--------------+ FV Prox  Full  Yes                                      +---------+---------------+---------+-----------+---------------+--------------+ FV Mid                           Yes        patent by color                                                           Doppler                       +---------+---------------+---------+-----------+---------------+--------------+ FV Distal                        Yes        patent by color                                                           Doppler                       +---------+---------------+---------+-----------+---------------+--------------+ POP      Full           No       Yes                                      +---------+---------------+---------+-----------+---------------+--------------+ PTV                              Yes        patent by color                                                           Doppler                       +---------+---------------+---------+-----------+---------------+--------------+   Right Technical Findings: Not visualized segments include PFV, peroneal veins, limited visualiazation PTV.  +---------+---------------+---------+-----------+---------------+--------------+ LEFT     CompressibilityPhasicitySpontaneityProperties     Thrombus Aging  +---------+---------------+---------+-----------+---------------+--------------+ CFV      Full           No       Yes                                      +---------+---------------+---------+-----------+---------------+--------------+ SFJ      Full                                                             +---------+---------------+---------+-----------+---------------+--------------+  FV Prox  Full                                                             +---------+---------------+---------+-----------+---------------+--------------+ FV Mid                           Yes        patent by color                                                           Doppler                       +---------+---------------+---------+-----------+---------------+--------------+ FV Distal               No       Yes        patent by color                                                           and PW Doppler                +---------+---------------+---------+-----------+---------------+--------------+ POP                              Yes        patent by color                                                           Doppler                       +---------+---------------+---------+-----------+---------------+--------------+   Left Technical Findings: Not visualized segments include PFV, PTV, peroneal veins.   Summary: RIGHT: - There is no evidence of deep vein thrombosis in the lower extremity. However, portions of this examination were limited- see technologist comments above.  - No cystic structure found in the popliteal fossa.  LEFT: - There is no evidence of deep vein thrombosis in the lower extremity. However, portions of this examination were limited- see technologist comments above.  - No cystic structure found in the popliteal fossa.  Pulsatile lower extremity venous flow is suggestive of possibly elevated right heart pressure. *See table(s) above for  measurements and observations. Electronically signed by Deitra Mayo MD on 07/26/2022 at 12:56:38 PM.    Final    [place] EKG SITE RITE  Result Date: 07/26/2022 If Site Rite image not attached, placement could not be confirmed due to current cardiac rhythm.  CT Angio Chest PE W and/or Wo Contrast  Result Date: 07/25/2022 CLINICAL DATA:  Shortness of breath, cough, leg swelling, history COPD, CHF, asthma, question pulmonary embolism  EXAM: CT ANGIOGRAPHY CHEST WITH CONTRAST TECHNIQUE: Multidetector CT imaging of the chest was performed using the standard protocol during bolus administration of intravenous contrast. Multiplanar CT image reconstructions and MIPs were obtained to evaluate the vascular anatomy. RADIATION DOSE REDUCTION: This exam was performed according to the departmental dose-optimization program which includes automated exposure control, adjustment of the mA and/or kV according to patient size and/or use of iterative reconstruction technique. CONTRAST:  81m OMNIPAQUE IOHEXOL 350 MG/ML SOLN IV COMPARISON:  CT chest 11/14/2018 FINDINGS: Cardiovascular: Scattered atherosclerotic calcifications aorta, proximal great vessels, and coronary arteries. Aorta normal caliber. LEFT subclavian ICD lead within RIGHT ventricle. Enlargement of cardiac chambers. No pericardial effusion. Pulmonary arteries adequately opacified and patent. No evidence of pulmonary embolism. Mediastinum/Nodes: Normal sized axillary lymph nodes. No thoracic adenopathy. Esophagus unremarkable. Base of cervical region normal appearance. Lungs/Pleura: Trace BILATERAL pleural effusions. Scattered mosaic attenuation in both lungs similar to prior exam. Minimal bibasilar atelectasis. No acute infiltrate, pleural effusion, or pneumothorax. Few tiny nodular foci in RIGHT upper lobe appear grossly unchanged. Upper Abdomen: Reflux of contrast into IVC and hepatic veins question elevated RIGHT heart pressure. Calcified granuloma  superiorly in liver versus diaphragmatic unchanged. Remaining visualized upper abdomen unremarkable. Musculoskeletal: Scattered subcutaneous and soft tissue edema. No acute osseous findings. Review of the MIP images confirms the above findings. IMPRESSION: No evidence of pulmonary embolism. Scattered mosaic attenuation in both lungs with minimal bibasilar atelectasis and trace BILATERAL pleural effusions. Scattered atherosclerotic calcifications including coronary arteries. Enlargement of cardiac chambers with question elevated RIGHT heart pressures. Aortic Atherosclerosis (ICD10-I70.0). Electronically Signed   By: MLavonia DanaM.D.   On: 07/25/2022 19:03   DG Chest Port 1 View  Result Date: 07/25/2022 CLINICAL DATA:  Shortness of breath. EXAM: PORTABLE CHEST 1 VIEW COMPARISON:  06/17/2022 FINDINGS: 1345 hours. The cardio pericardial silhouette is enlarged. The lungs are clear without focal pneumonia, edema, pneumothorax or pleural effusion. Interstitial markings are diffusely coarsened with chronic features. Bones are diffusely demineralized. Telemetry leads overlie the chest. IMPRESSION: Enlarged cardiopericardial silhouette without acute cardiopulmonary findings. Electronically Signed   By: EMisty StanleyM.D.   On: 07/25/2022 13:51     Scheduled Meds:  aspirin EC  81 mg Oral Daily   atorvastatin  80 mg Oral Daily   Chlorhexidine Gluconate Cloth  6 each Topical Daily   insulin aspart  0-15 Units Subcutaneous TID WC   insulin aspart  0-5 Units Subcutaneous QHS   insulin glargine-yfgn  10 Units Subcutaneous Daily   levalbuterol  0.63 mg Nebulization TID   nicotine  21 mg Transdermal Daily   potassium chloride  40 mEq Oral Daily   sodium chloride flush  10-40 mL Intracatheter Q12H   sodium chloride flush  3 mL Intravenous Q12H   umeclidinium-vilanterol  1 puff Inhalation Daily   Continuous Infusions:  sodium chloride     sodium chloride Stopped (07/26/22 0932)   amiodarone 60 mg/hr (07/27/22  0349)   DOBUTamine 2.5 mcg/kg/min (07/26/22 1055)   furosemide (LASIX) 200 mg in dextrose 5 % 100 mL (2 mg/mL) infusion 15 mg/hr (07/27/22 1000)   heparin 1,250 Units/hr (07/27/22 0925)     LOS: 2 days    Time spent: 344m    PrDomenic PoliteMD [facility]   07/27/2022, 11:01 AM

## 2022-07-27 NOTE — Progress Notes (Signed)
Pt not on Bipap at this time, no distress noted. RT will continue to monitor.

## 2022-07-27 NOTE — Progress Notes (Signed)
ANTICOAGULATION CONSULT NOTE Pharmacy Consult for heparin Indication: atrial fibrillation Brief A/P: Heparin level supratherapeutic Decrease Heparin rate  Allergies  Allergen Reactions   Actos [Pioglitazone] Other (See Comments)    congestive heart failure    Naproxen Other (See Comments)    '500mg'$  dose made her sleep for two days   Rosiglitazone Hives   Tomato Itching   Hydrocodone-Acetaminophen Itching   Hydrocortisone Hives and Itching    Patient Measurements: Weight: 100.8 kg (222 lb 4.8 oz) Heparin Dosing Weight: 100kg  Vital Signs: Temp: 97.3 F (36.3 C) (10/25 0545) Temp Source: Oral (10/25 0545) BP: 148/69 (10/25 0545) Pulse Rate: 67 (10/25 0545)  Labs: Recent Labs    07/25/22 1320 07/25/22 1325 07/25/22 1600 07/26/22 0314 07/26/22 1046 07/26/22 1338 07/27/22 0500  HGB 12.5   < >  --  10.9* 12.9  --  10.3*  HCT 41.6   < >  --  35.1* 38.0  --  33.0*  PLT 275  --   --  199  --   --  207  HEPARINUNFRC  --   --   --  0.16*  --  0.70 0.96*  CREATININE 1.74*  --   --  1.95*  --   --   --   TROPONINIHS 49*  --  48*  --   --   --   --    < > = values in this interval not displayed.     Estimated Creatinine Clearance: 30.7 mL/min (A) (by C-G formula based on SCr of 1.95 mg/dL (H)).  Assessment: 71 y.o. female with Afib for heparin  Goal of Therapy:  Heparin level 0.3-0.7 units/ml Monitor platelets by anticoagulation protocol: Yes   Plan:  Decrease Heparin 1250 units/hr Check heparin level in 8 hours.   Phillis Knack, PharmD, BCPS

## 2022-07-27 NOTE — Progress Notes (Addendum)
Advanced Heart Failure Rounding Note  PCP-Cardiologist: None   Subjective:    Admitted with A/C HFrEF and new onset A flutter. Started on amio + heparin.   Yesterday DBA 2.5 mcg added. Diuresed with IV lasix + metolazone.   Chemically converted on IV amio. Continued today   Sluggish diuresis with IV lasix.   Lactic acid 3.8>3>4.8> pending.  Creatinine 1.7>1.95>1.97  Feeling better.    Objective:   Weight Range: 100.8 kg Body mass index is 40.66 kg/m.   Vital Signs:   Temp:  [97.2 F (36.2 C)-97.4 F (36.3 C)] 97.3 F (36.3 C) (10/25 0545) Pulse Rate:  [58-69] 67 (10/25 0545) Resp:  [12-27] 17 (10/25 0545) BP: (95-150)/(62-87) 148/69 (10/25 0545) SpO2:  [99 %-100 %] 99 % (10/25 0545) Weight:  [100.8 kg] 100.8 kg (10/25 0545) Last BM Date : 07/25/22  Weight change: Filed Weights   07/27/22 0545  Weight: 100.8 kg    Intake/Output:   Intake/Output Summary (Last 24 hours) at 07/27/2022 0847 Last data filed at 07/27/2022 0349 Gross per 24 hour  Intake 955.88 ml  Output --  Net 955.88 ml     CVP 18 with prominent V waves up to 30  Physical Exam  General:  In bed. . No resp difficulty HEENT: normal Neck: supple. JVP to jaw. Carotids 2+ bilat; no bruits. No lymphadenopathy or thryomegaly appreciated. Cor: PMI nondisplaced. Regular rate & rhythm. No rubs, gallops or murmurs. Lungs: clear Abdomen: soft, nontender, nondistended. No hepatosplenomegaly. No bruits or masses. Good bowel sounds. Extremities: no cyanosis, clubbing, rash, R and LLE 2+ edema Neuro: alert & orientedx3, cranial nerves grossly intact. moves all 4 extremities w/o difficulty. Affect pleasant  Telemetry  Converted to SR.   EKG  N/A Labs    CBC Recent Labs    07/25/22 1320 07/25/22 1325 07/26/22 0314 07/26/22 1046 07/27/22 0500  WBC 8.5  --  7.8  --  12.6*  NEUTROABS 6.3  --   --   --   --   HGB 12.5   < > 10.9* 12.9 10.3*  HCT 41.6   < > 35.1* 38.0 33.0*  MCV 85.6  --   84.8  --  84.4  PLT 275  --  199  --  207   < > = values in this interval not displayed.   Basic Metabolic Panel Recent Labs    07/25/22 1318 07/25/22 1320 07/26/22 0314 07/26/22 1046 07/27/22 0500  NA  --    < > 136 135 137  K  --    < > 5.0 4.5 4.2  CL  --    < > 98  --  97*  CO2  --    < > 28  --  28  GLUCOSE  --    < > 413*  --  197*  BUN  --    < > 35*  --  43*  CREATININE  --    < > 1.95*  --  1.97*  CALCIUM  --    < > 8.3*  --  8.3*  MG 2.1  --   --   --   --    < > = values in this interval not displayed.   Liver Function Tests Recent Labs    07/25/22 1320  AST 45*  ALT 36  ALKPHOS 266*  BILITOT 1.6*  PROT 7.4  ALBUMIN 2.8*   No results for input(s): "LIPASE", "AMYLASE" in the last 72 hours. Cardiac  Enzymes No results for input(s): "CKTOTAL", "CKMB", "CKMBINDEX", "TROPONINI" in the last 72 hours.  BNP: BNP (last 3 results) Recent Labs    06/26/22 1445 07/13/22 1609 07/25/22 1321  BNP 418.1* 358.7* 461.6*    ProBNP (last 3 results) No results for input(s): "PROBNP" in the last 8760 hours.   D-Dimer No results for input(s): "DDIMER" in the last 72 hours. Hemoglobin A1C No results for input(s): "HGBA1C" in the last 72 hours. Fasting Lipid Panel No results for input(s): "CHOL", "HDL", "LDLCALC", "TRIG", "CHOLHDL", "LDLDIRECT" in the last 72 hours. Thyroid Function Tests No results for input(s): "TSH", "T4TOTAL", "T3FREE", "THYROIDAB" in the last 72 hours.  Invalid input(s): "FREET3"  Other results:   Imaging    VAS [place] LOWER EXTREMITY VENOUS (DVT)  Result Date: 07/26/2022  Lower Venous DVT Study Patient Name:  [name]  Date of Exam:   07/26/2022 Medical Rec #: [id number]     Accession #:    [id number] Date of Birth: [birth year]    Patient Gender: F Patient Age:   71 years Exam Location:  [facility] Procedure:      VAS [place] LOWER EXTREMITY VENOUS (DVT) Referring Phys: Wynetta Fines  --------------------------------------------------------------------------------  Indications: Edema.  Limitations: Poor ultrasound/tissue interface, body habitus and unable to obtain long axis image of left popliteal vein secondary to patient body habitus. Comparison Study: 06/17/2022- negative right lower extremity venous duplex Performing Technologist: Maudry Mayhew MHA, RDMS, RVT, RDCS  Examination Guidelines: A complete evaluation includes B-mode imaging, spectral Doppler, color Doppler, and power Doppler as needed of all accessible portions of each vessel. Bilateral testing is considered an integral part of a complete examination. Limited examinations for reoccurring indications may be performed as noted. The reflux portion of the exam is performed with the patient in reverse Trendelenburg.  +---------+---------------+---------+-----------+---------------+--------------+ RIGHT    CompressibilityPhasicitySpontaneityProperties     Thrombus Aging +---------+---------------+---------+-----------+---------------+--------------+ CFV      Full           No       Yes                                      +---------+---------------+---------+-----------+---------------+--------------+ SFJ      Full                                                             +---------+---------------+---------+-----------+---------------+--------------+ FV Prox  Full                    Yes                                      +---------+---------------+---------+-----------+---------------+--------------+ FV Mid                           Yes        patent by color  Doppler                       +---------+---------------+---------+-----------+---------------+--------------+ FV Distal                        Yes        patent by color                                                           Doppler                        +---------+---------------+---------+-----------+---------------+--------------+ POP      Full           No       Yes                                      +---------+---------------+---------+-----------+---------------+--------------+ PTV                              Yes        patent by color                                                           Doppler                       +---------+---------------+---------+-----------+---------------+--------------+   Right Technical Findings: Not visualized segments include PFV, peroneal veins, limited visualiazation PTV.  +---------+---------------+---------+-----------+---------------+--------------+ LEFT     CompressibilityPhasicitySpontaneityProperties     Thrombus Aging +---------+---------------+---------+-----------+---------------+--------------+ CFV      Full           No       Yes                                      +---------+---------------+---------+-----------+---------------+--------------+ SFJ      Full                                                             +---------+---------------+---------+-----------+---------------+--------------+ FV Prox  Full                                                             +---------+---------------+---------+-----------+---------------+--------------+ FV Mid                           Yes        patent by color  Doppler                       +---------+---------------+---------+-----------+---------------+--------------+ FV Distal               No       Yes        patent by color                                                           and PW Doppler                +---------+---------------+---------+-----------+---------------+--------------+ POP                              Yes        patent by color                                                           Doppler                        +---------+---------------+---------+-----------+---------------+--------------+   Left Technical Findings: Not visualized segments include PFV, PTV, peroneal veins.   Summary: RIGHT: - There is no evidence of deep vein thrombosis in the lower extremity. However, portions of this examination were limited- see technologist comments above.  - No cystic structure found in the popliteal fossa.  LEFT: - There is no evidence of deep vein thrombosis in the lower extremity. However, portions of this examination were limited- see technologist comments above.  - No cystic structure found in the popliteal fossa.  Pulsatile lower extremity venous flow is suggestive of possibly elevated right heart pressure. *See table(s) above for measurements and observations. Electronically signed by Deitra Mayo MD on 07/26/2022 at 12:56:38 PM.    Final      Medications:     Scheduled Medications:  aspirin EC  81 mg Oral Daily   atorvastatin  80 mg Oral Daily   Chlorhexidine Gluconate Cloth  6 each Topical Daily   furosemide  80 mg Intravenous BID   insulin aspart  0-15 Units Subcutaneous TID WC   insulin aspart  0-5 Units Subcutaneous QHS   insulin glargine-yfgn  10 Units Subcutaneous Daily   levalbuterol  0.63 mg Nebulization TID   nicotine  21 mg Transdermal Daily   sodium chloride flush  10-40 mL Intracatheter Q12H   sodium chloride flush  3 mL Intravenous Q12H   umeclidinium-vilanterol  1 puff Inhalation Daily    Infusions:  sodium chloride     sodium chloride Stopped (07/26/22 0932)   amiodarone 60 mg/hr (07/27/22 0349)   DOBUTamine 2.5 mcg/kg/min (07/26/22 1055)   heparin 1,500 Units/hr (07/26/22 1700)    PRN Medications: sodium chloride, levalbuterol, ondansetron (ZOFRAN) IV, sodium chloride flush, sodium chloride flush    Patient Profile    [name] is a 71 year old with a history of DMII, COPD, HTN, chronic respiratory failure, tobacco abuse, St Jude ICD, and chronic  HFrEF. EF has 25-30% for at least 5 years. Last LHC 2017, normal  coronaries.    Presented with increased shortness of breath. New onset A flutter and A/C HFrEF with marked volume overload.    Assessment/Plan   1. Acute on Chronic Hypoxic Respiratory Failure --> Multifactorial AECOPD and A/C HFrEF.  -Started on solumedrol. + inhalers. -Chronically on 2-3 liters [place]. Placed on Bipap on arrival.  - Now on nasal cannula.  - Attempt to diurese with IV lasix. Hopefully can transition to nasal cannula with diuresis.  -SARS2 negative CTA - negative PE.  - Improving with diuresis.    2. A/C HFrEF-->Cardiogenic Shock - NICM LHC 2017 normal cors. -2022 Echo EF 25-30% which has been down for many years. Repeat ECHO.   - Interrogate St Jude ICD. BNP 461.  - Lactic acid 3.8>3  + worsening renal function.  - Repeat lactic acid now. Hopefully improved with restoration of SR.  - Continue DBA 2.5 mcg.  - Needs aggressive diuresis. Give 80 mg IV lasix x1 followed by lasix drip at 15 mg per hour and 5 mg metolazone.  - Hold off GDMT with AKI and decompensation.  - Add unna boots. Ordered on admit.    3. New Onset A flutter RVR - Of note had TEE 06/2022- no thrombus noted.  -Continue heparin drip. Start eliquis after stabilize.    -Back in SR today. Continue IV amio today.    4. AKI  -Creatinine baseline ~ 1.2  -On admit 1.7-->today 1.97  -Hold spiro.    5. DMII, uncontrolled  - On steroids. -On SSI    6. Obesity    Ambulate.   Length of Stay: 2  Darrick Grinder, NP  07/27/2022, 8:47 AM  Advanced Heart Failure Team Pager [phone] (M-F; 7a - 5p)  Please contact [facility] for night-coverage after hours (5p -7a ) and weekends on [email or web address]  Patient seen and examined with the above-signed Advanced Practice Provider and/or Housestaff. I personally reviewed laboratory data, imaging studies and relevant notes. I independently examined the patient and formulated the important aspects of the  plan. I have edited the note to reflect any of my changes or salient points. I have personally discussed the plan with the patient and/or family.  Remains on dobutamine and IV amio. Back in NSR. Co-ox this am improved. CVP still high at 18 with v-waves > 30. Lactic acid has normalized.   Denies CP or SOB. Feels weak.   General:  Lying in bed  No resp difficulty HEENT: normal Neck: supple. JVP to ear . Carotids 2+ bilat; no bruits. No lymphadenopathy or thryomegaly appreciated. Cor: PMI nondisplaced. Regular rate & rhythm. No rubs, gallops or murmurs. Lungs: clear Abdomen: soft, nontender, nondistended. No hepatosplenomegaly. No bruits or masses. Good bowel sounds. Extremities: no cyanosis, clubbing, rash, 3+ edema Neuro: alert & orientedx3, cranial nerves grossly intact. moves all 4 extremities w/o difficulty. Affect pleasant  Co-ox improved on dobutamine. Back in NSR on amio. Massively volume overloaded. Continue IV diuresis. Place UNNA boots. Continue heparin. Pending repeat echo. Glori Bickers, MD  6:10 PM

## 2022-07-27 NOTE — Inpatient Diabetes Management (Signed)
Inpatient Diabetes Program Recommendations  AACE/ADA: New Consensus Statement on Inpatient Glycemic Control (2015)  Target Ranges:  Prepandial:   less than 140 mg/dL      Peak postprandial:   less than 180 mg/dL (1-2 hours)      Critically ill patients:  140 - 180 mg/dL   Lab Results  Component Value Date   GLUCAP 368 (H) 07/26/2022   HGBA1C 9.7 (H) 06/17/2022    Review of Glycemic Control  Latest Reference Range & Units 07/26/22 07:41 07/26/22 11:46 07/26/22 16:11 07/26/22 22:05  Glucose-Capillary 70 - 99 mg/dL 436 (H) 345 (H) 324 (H) 368 (H)  (H): Data is abnormally high  Diabetes history: DM2 Outpatient Diabetes medications: Farxiga 10 mg QD Current orders for Inpatient glycemic control: Semglee 10 units QD, Novolog 0-15 units TI Dand 0-5 units QHS  Inpatient Diabetes Program Recommendations:    Semglee 20 units QD (100.8 kg x 0.2 units)  Will continue to follow while inpatient.  Thank you, Reche Dixon, MSN, [place] [facility] Coordinator Inpatient Diabetes Program [phone] (team pager from 8a-5p)

## 2022-07-27 NOTE — Evaluation (Signed)
Physical Therapy Evaluation Patient Details Name: [name] MRN: [id number] DOB: 11-Jul-1951 Today's Date: 07/27/2022  History of Present Illness  Pt is a 71 y.o. female who presented 07/25/22 with cough, SOB, and lower extremity edema. Pt admitted with A/C HFrEF and new onset A flutter. PMH: COPD Gold stage II with chronic hypoxic respite failure on nocturnal nasal cannula, chronic combined HFpEF and HFrEF, LVEF 20-25%, status post AICD, IIDM, morbid obesity, cigarette smoker, HTN, HLD, lipoma   Clinical Impression  Pt presents with condition above and deficits mentioned below, see PT Problem List. PTA, she was mod I using her rollator and intermittently holding onto furniture for support. She lives alone in a 1-level apartment with 3 STE and has family and friends available to assist her as needed. Currently, pt demonstrates deficits in activity tolerance, balance, and overall strength. She is at risk for falls and endorses several recent falls. Her HR elevating up to the 140s with activity today limited the distance she was able to safely ambulate using a RW with min guard assist. Recommending pt continue with HHPT at d/c. Will continue to follow acutely.       Recommendations for follow up therapy are one component of a multi-disciplinary discharge planning process, led by the attending physician.  Recommendations may be updated based on patient status, additional functional criteria and insurance authorization.  Follow Up Recommendations Home health PT      Assistance Recommended at Discharge PRN  Patient can return home with the following  Assistance with cooking/housework;Help with stairs or ramp for entrance;Assist for transportation    Equipment Recommendations None recommended by PT  Recommendations for Other Services       Functional Status Assessment Patient has had a recent decline in their functional status and demonstrates the ability to make significant improvements in  function in a reasonable and predictable amount of time.     Precautions / Restrictions Precautions Precautions: Fall;Other (comment) Precaution Comments: watch HR Restrictions Weight Bearing Restrictions: No      Mobility  Bed Mobility Overal bed mobility: Needs Assistance Bed Mobility: Supine to Sit     Supine to sit: Min assist, HOB elevated     General bed mobility comments: Light minA for pt to pull on therapist's hand to scoot hips to EOB, otherwise could ascend her trunk and pivot her body to sit up with min guard    Transfers Overall transfer level: Needs assistance Equipment used: Rolling walker (2 wheels) Transfers: Sit to/from Stand Sit to Stand: Min guard           General transfer comment: Min guard for safety, no LOB    Ambulation/Gait Ambulation/Gait assistance: Min guard Gait Distance (Feet): 45 Feet Assistive device: Rolling walker (2 wheels) Gait Pattern/deviations: Step-through pattern, Decreased stride length, Wide base of support Gait velocity: reduced Gait velocity interpretation: <1.31 ft/sec, indicative of household ambulator   General Gait Details: Pt with small, slow steps and slightly wide BOS. As distance progressed, her foot clearance and fluidity with gait improved, but was limited in distance by her fluctuating HR (up to 140s at times). No LOB, min guard for safety  Stairs            Wheelchair Mobility    Modified Rankin (Stroke Patients Only)       Balance Overall balance assessment: Needs assistance Sitting-balance support: No upper extremity supported, Feet supported Sitting balance-Leahy Scale: Good     Standing balance support: Single extremity supported, During functional activity  Standing balance-Leahy Scale: Poor Standing balance comment: Reliant on 1 UE support for dynamic standing tasks, uses RW for mobility                             Pertinent Vitals/Pain Pain Assessment Pain Assessment:  Faces Faces Pain Scale: Hurts a little bit Pain Location: generalized with mobility Pain Descriptors / Indicators: Grimacing Pain Intervention(s): Monitored during session, Limited activity within patient's tolerance    Home Living Family/patient expects to be discharged to:: Private residence Living Arrangements: Alone Available Help at Discharge: Family;Available 24 hours/day Type of Home: Apartment Home Access: Stairs to enter Entrance Stairs-Rails: Right Entrance Stairs-Number of Steps: 3   Home Layout: One level Home Equipment: Cane - single point;Rollator (4 wheels);Toilet riser;Tub bench;BSC/3in1      Prior Function Prior Level of Function : Independent/Modified Independent             Mobility Comments: Has been using rollator and intermittently furniture surfs. Hx of several falls. ADLs Comments: Family and friend assist with household cleaning and transportation, but pt reports being capable of being mod I     Hand Dominance        Extremity/Trunk Assessment   Upper Extremity Assessment Upper Extremity Assessment: Overall WFL for tasks assessed    Lower Extremity Assessment Lower Extremity Assessment: Generalized weakness (bil edema and hypersensativity in feet noted)    Cervical / Trunk Assessment Cervical / Trunk Assessment: Normal  Communication   Communication: No difficulties  Cognition Arousal/Alertness: Awake/alert Behavior During Therapy: WFL for tasks assessed/performed Overall Cognitive Status: Within Functional Limits for tasks assessed                                 General Comments: Needed encouragement initially to mobilize, but once educated pt was very agreeable        General Comments General comments (skin integrity, edema, etc.): HR fluctuating from 120s-140s, SpO2 >/= 97% on RA    Exercises     Assessment/Plan    PT Assessment Patient needs continued PT services  PT Problem List Decreased strength;Decreased  activity tolerance;Decreased balance;Decreased mobility;Cardiopulmonary status limiting activity;Impaired sensation       PT Treatment Interventions DME instruction;Gait training;Stair training;Functional mobility training;Therapeutic activities;Therapeutic exercise;Balance training;Neuromuscular re-education;Patient/family education    PT Goals (Current goals can be found in the Care Plan section)  Acute Rehab PT Goals Patient Stated Goal: to continue with HHPT PT Goal Formulation: With patient Time For Goal Achievement: 08/10/22 Potential to Achieve Goals: Good    Frequency Min 3X/week     Co-evaluation               AM-PAC PT "6 Clicks" Mobility  Outcome Measure Help needed turning from your back to your side while in a flat bed without using bedrails?: A Little Help needed moving from lying on your back to sitting on the side of a flat bed without using bedrails?: A Little Help needed moving to and from a bed to a chair (including a wheelchair)?: A Little Help needed standing up from a chair using your arms (e.g., wheelchair or bedside chair)?: A Little Help needed to walk in hospital room?: A Little Help needed climbing 3-5 steps with a railing? : A Little 6 Click Score: 18    End of Session Equipment Utilized During Treatment: Gait belt Activity Tolerance: Patient tolerated treatment well;Other (comment) (  limited by elevated HR) Patient left: in chair;with call bell/phone within reach;with chair alarm set Nurse Communication: Mobility status;Other (comment) (HR, SpO2, grey cord from chair alarm box to wall is not present) PT Visit Diagnosis: Unsteadiness on feet (R26.81);Other abnormalities of gait and mobility (R26.89);Muscle weakness (generalized) (M62.81);Difficulty in walking, not elsewhere classified (R26.2);History of falling (Z91.81)    Time: 4801-6553 PT Time Calculation (min) (ACUTE ONLY): 23 min   Charges:   PT Evaluation $PT Eval Moderate Complexity: 1  Mod PT Treatments $Therapeutic Activity: 8-22 mins        Moishe Spice, PT, DPT Acute Rehabilitation Services  Office: [phone]   Orvan Falconer 07/27/2022, 10:51 AM

## 2022-07-28 DIAGNOSIS — I5023 Acute on chronic systolic (congestive) heart failure: Secondary | ICD-10-CM | POA: Diagnosis not present

## 2022-07-28 LAB — CBC
HCT: 30.8 % — ABNORMAL LOW (ref 36.0–46.0)
Hemoglobin: 9.7 g/dL — ABNORMAL LOW (ref 12.0–15.0)
MCH: 26.4 pg (ref 26.0–34.0)
MCHC: 31.5 g/dL (ref 30.0–36.0)
MCV: 83.7 fL (ref 80.0–100.0)
Platelets: 202 10*3/uL (ref 150–400)
RBC: 3.68 MIL/uL — ABNORMAL LOW (ref 3.87–5.11)
RDW: 19.4 % — ABNORMAL HIGH (ref 11.5–15.5)
WBC: 9.1 10*3/uL (ref 4.0–10.5)
nRBC: 0 % (ref 0.0–0.2)

## 2022-07-28 LAB — GLUCOSE, CAPILLARY
Glucose-Capillary: 101 mg/dL — ABNORMAL HIGH (ref 70–99)
Glucose-Capillary: 108 mg/dL — ABNORMAL HIGH (ref 70–99)
Glucose-Capillary: 79 mg/dL (ref 70–99)
Glucose-Capillary: 88 mg/dL (ref 70–99)

## 2022-07-28 LAB — HEPARIN LEVEL (UNFRACTIONATED)
Heparin Unfractionated: 0.15 IU/mL — ABNORMAL LOW (ref 0.30–0.70)
Heparin Unfractionated: 0.18 IU/mL — ABNORMAL LOW (ref 0.30–0.70)
Heparin Unfractionated: 0.41 IU/mL (ref 0.30–0.70)

## 2022-07-28 LAB — COOXEMETRY PANEL
Carboxyhemoglobin: 2.1 % — ABNORMAL HIGH (ref 0.5–1.5)
Methemoglobin: <0.7 % (ref 0.0–1.5)
O2 Saturation: 66.5 %
Total hemoglobin: 10 g/dL — ABNORMAL LOW (ref 12.0–16.0)

## 2022-07-28 LAB — BASIC METABOLIC PANEL
Anion gap: 10 (ref 5–15)
BUN: 44 mg/dL — ABNORMAL HIGH (ref 8–23)
CO2: 30 mmol/L (ref 22–32)
Calcium: 8.5 mg/dL — ABNORMAL LOW (ref 8.9–10.3)
Chloride: 96 mmol/L — ABNORMAL LOW (ref 98–111)
Creatinine, Ser: 2.09 mg/dL — ABNORMAL HIGH (ref 0.44–1.00)
GFR, Estimated: 25 mL/min — ABNORMAL LOW (ref >60)
Glucose, Bld: 98 mg/dL (ref 70–99)
Potassium: 4.7 mmol/L (ref 3.5–5.1)
Sodium: 136 mmol/L (ref 135–145)

## 2022-07-28 LAB — MAGNESIUM: Magnesium: 2 mg/dL (ref 1.7–2.4)

## 2022-07-28 MED ORDER — METOLAZONE 5 MG PO TABS
2.5000 mg | ORAL_TABLET | Freq: Once | ORAL | Status: AC
  Administered 2022-07-28: 2.5 mg via ORAL
  Filled 2022-07-28: qty 1

## 2022-07-28 NOTE — Progress Notes (Signed)
Physical Therapy Treatment Patient Details Name: [name] MRN: [id number] DOB: [birth year] Today's Date: 07/28/2022   History of Present Illness Pt is a 71 y.o. female who presented 07/25/22 with cough, SOB, and lower extremity edema. Pt admitted with A/C HFrEF and new onset A flutter. PMH: COPD Gold stage II with chronic hypoxic respite failure on nocturnal nasal cannula, chronic combined HFpEF and HFrEF, LVEF 20-25%, status post AICD, IIDM, morbid obesity, cigarette smoker, HTN, HLD, lipoma    PT Comments    Pt making steady progress. Still with significant excess fluid on LE's. Worked on strengthening of LE's as well as ambulation. Continue to recommend HHPT at time of dc.    Recommendations for follow up therapy are one component of a multi-disciplinary discharge planning process, led by the attending physician.  Recommendations may be updated based on patient status, additional functional criteria and insurance authorization.  Follow Up Recommendations  Home health PT     Assistance Recommended at Discharge PRN  Patient can return home with the following Assistance with cooking/housework;Help with stairs or ramp for entrance;Assist for transportation   Equipment Recommendations  None recommended by PT    Recommendations for Other Services       Precautions / Restrictions Precautions Precautions: Fall;Other (comment) Precaution Comments: watch HR Restrictions Weight Bearing Restrictions: No     Mobility  Bed Mobility Overal bed mobility: Needs Assistance Bed Mobility: Supine to Sit     Supine to sit: Mod assist, HOB elevated     General bed mobility comments: Assist bring legs off of bed, elevating trunk to EOB, and bringing hips to EOB.    Transfers Overall transfer level: Needs assistance Equipment used: Rolling walker (2 wheels) Transfers: Sit to/from Stand, Bed to chair/wheelchair/BSC Sit to Stand: Min guard, Min assist   Step pivot transfers: Min  assist       General transfer comment: Primarily min guard for safety. Min assist one time due to slight posterior loss of balance. Performed sit to stand x 11. Min assist for step pivot bed to recliner without assistive device    Ambulation/Gait Ambulation/Gait assistance: Min guard Gait Distance (Feet): 40 Feet Assistive device: Rolling walker (2 wheels) Gait Pattern/deviations: Step-through pattern, Decreased stride length, Wide base of support Gait velocity: decr Gait velocity interpretation: <1.31 ft/sec, indicative of household ambulator   General Gait Details: Assist for safety. Pt with significant fluid on LE's causing wide base   Stairs             Wheelchair Mobility    Modified Rankin (Stroke Patients Only)       Balance Overall balance assessment: Needs assistance Sitting-balance support: No upper extremity supported, Feet supported Sitting balance-Leahy Scale: Good     Standing balance support: During functional activity, No upper extremity supported Standing balance-Leahy Scale: Fair Standing balance comment: Static standing with min guard without device. Assist/device for any dynamic movement                            Cognition Arousal/Alertness: Awake/alert Behavior During Therapy: WFL for tasks assessed/performed Overall Cognitive Status: Within Functional Limits for tasks assessed                                          Exercises Other Exercises Other Exercises: repeated sit to stand 3 x 3 reps. 2  sets with walker and 1 set without assistive device    General Comments General comments (skin integrity, edema, etc.): VSS on RA      Pertinent Vitals/Pain Pain Assessment Pain Assessment: No/denies pain    Home Living                          Prior Function            PT Goals (current goals can now be found in the care plan section) Acute Rehab PT Goals Patient Stated Goal: to continue with  HHPT Progress towards PT goals: Progressing toward goals    Frequency    Min 3X/week      PT Plan Current plan remains appropriate    Co-evaluation              AM-PAC PT "6 Clicks" Mobility   Outcome Measure  Help needed turning from your back to your side while in a flat bed without using bedrails?: A Little Help needed moving from lying on your back to sitting on the side of a flat bed without using bedrails?: A Little Help needed moving to and from a bed to a chair (including a wheelchair)?: A Little Help needed standing up from a chair using your arms (e.g., wheelchair or bedside chair)?: A Little Help needed to walk in hospital room?: A Little Help needed climbing 3-5 steps with a railing? : A Little 6 Click Score: 18    End of Session   Activity Tolerance: Patient tolerated treatment well Patient left: in chair;with call bell/phone within reach;with chair alarm set Nurse Communication: Mobility status;Other (comment) PT Visit Diagnosis: Unsteadiness on feet (R26.81);Other abnormalities of gait and mobility (R26.89);Muscle weakness (generalized) (M62.81);Difficulty in walking, not elsewhere classified (R26.2);History of falling (Z91.81)     Time: 0768-0881 PT Time Calculation (min) (ACUTE ONLY): 28 min  Charges:  $Gait Training: 8-22 mins $Therapeutic Exercise: 8-22 mins                     [place] [facility] [place] 07/28/2022, 3:06 PM

## 2022-07-28 NOTE — Progress Notes (Addendum)
Advanced Heart Failure Rounding Note  PCP-Cardiologist: None   Subjective:    Admitted with A/C HFrEF and new onset A flutter. Started on amio + heparin. Lasix gtt + DBA.   Echo yesterday, LVEF 25-30%, D shaped LV, RV mildly dilated w/ mod elevated RVSP, RV systolic fx mildly reduced, severe BAE, severe TR, mild-mod MR, IVC dilated, assumed RAP 15   Co-ox 67% on DBA 2.5. Lactic acid 3.8>3>1.7   On lasix gtt 15/hr. Only 1.4L in UOP yesterday. Wt down 2 lb. CVP 20. SCr up 1.97>>2.09  K 4.7, Mg 2.0   In NSR, HR 70s. On amio gtt 60/hr + heparin gtt.   Feels ok. No current complaints. Resting comfortably in bed.    Objective:   Weight Range: 100.2 kg Body mass index is 40.38 kg/m.   Vital Signs:   Temp:  [97.2 F (36.2 C)-97.6 F (36.4 C)] 97.6 F (36.4 C) (10/26 0808) Pulse Rate:  [68-107] 83 (10/26 0808) Resp:  [17-22] 20 (10/26 0511) BP: (113-131)/(52-68) 130/57 (10/26 0808) SpO2:  [96 %-100 %] 100 % (10/26 0808) Weight:  [100.2 kg] 100.2 kg (10/26 0537) Last BM Date : 07/25/22  Weight change: Filed Weights   07/27/22 0545 07/28/22 0537  Weight: 100.8 kg 100.2 kg    Intake/Output:   Intake/Output Summary (Last 24 hours) at 07/28/2022 0839 Last data filed at 07/28/2022 0500 Gross per 24 hour  Intake 968.11 ml  Output 1400 ml  Net -431.89 ml     Physical Exam   CVP 20 with prominent V waves up to 30   General:  obese female. No respiratory difficulty HEENT: normal Neck: supple. JVD elevated to jaw. Carotids 2+ bilat; no bruits. No lymphadenopathy or thyromegaly appreciated. Cor: PMI nondisplaced. Regular rate & rhythm. 2/6 TR murmur  Lungs: clear Abdomen: soft, nontender, nondistended. No hepatosplenomegaly. No bruits or masses. Good bowel sounds. Extremities: no cyanosis, clubbing, rash, 1+ b/l LEE  Neuro: alert & oriented x 3, cranial nerves grossly intact. moves all 4 extremities w/o difficulty. Affect pleasant.  Telemetry   NSR 70s   EKG   N/A Labs    CBC Recent Labs    07/25/22 1320 07/25/22 1325 07/27/22 0500 07/28/22 0527  WBC 8.5   < > 12.6* 9.1  NEUTROABS 6.3  --   --   --   HGB 12.5   < > 10.3* 9.7*  HCT 41.6   < > 33.0* 30.8*  MCV 85.6   < > 84.4 83.7  PLT 275   < > 207 202   < > = values in this interval not displayed.   Basic Metabolic Panel Recent Labs    07/25/22 1318 07/25/22 1320 07/27/22 0500 07/28/22 0527  NA  --    < > 137 136  K  --    < > 4.2 4.7  CL  --    < > 97* 96*  CO2  --    < > 28 30  GLUCOSE  --    < > 197* 98  BUN  --    < > 43* 44*  CREATININE  --    < > 1.97* 2.09*  CALCIUM  --    < > 8.3* 8.5*  MG 2.1  --   --  2.0   < > = values in this interval not displayed.   Liver Function Tests Recent Labs    07/25/22 1320  AST 45*  ALT 36  ALKPHOS 266*  BILITOT  1.6*  PROT 7.4  ALBUMIN 2.8*   No results for input(s): "LIPASE", "AMYLASE" in the last 72 hours. Cardiac Enzymes No results for input(s): "CKTOTAL", "CKMB", "CKMBINDEX", "TROPONINI" in the last 72 hours.  BNP: BNP (last 3 results) Recent Labs    06/26/22 1445 07/13/22 1609 07/25/22 1321  BNP 418.1* 358.7* 461.6*    ProBNP (last 3 results) No results for input(s): "PROBNP" in the last 8760 hours.   D-Dimer No results for input(s): "DDIMER" in the last 72 hours. Hemoglobin A1C No results for input(s): "HGBA1C" in the last 72 hours. Fasting Lipid Panel No results for input(s): "CHOL", "HDL", "LDLCALC", "TRIG", "CHOLHDL", "LDLDIRECT" in the last 72 hours. Thyroid Function Tests No results for input(s): "TSH", "T4TOTAL", "T3FREE", "THYROIDAB" in the last 72 hours.  Invalid input(s): "FREET3"  Other results:   Imaging    ECHOCARDIOGRAM COMPLETE  Result Date: 07/27/2022    ECHOCARDIOGRAM REPORT   Patient Name:   [name] Date of Exam: 07/27/2022 Medical Rec #:  [id number]    Height:       62.0 in Accession #:    [id number]   Weight:       222.3 lb Date of Birth:  11-21-1950   BSA:           2.000 m Patient Age:   71 years     BP:           113/53 mmHg Patient Gender: F            HR:           77 bpm. Exam Location:  Inpatient Procedure: 2D Echo, 3D Echo, Strain Analysis, Intracardiac Opacification Agent,            Cardiac Doppler and Color Doppler Indications:    CHF-Acute Systolic V89.38  History:        Patient has prior history of Echocardiogram examinations, most                 recent 06/16/2021. CHF, ICD, Arrythmias:Atrial Flutter,                 Signs/Symptoms:Shortness of Breath; Risk Factors:Hypertension,                 Diabetes, Dyslipidemia, Current Smoker and Sleep Apnea.  Sonographer:    Greer Pickerel Referring Phys: [phone] AMY D CLEGG  Sonographer Comments: Image acquisition challenging due to COPD, Image acquisition challenging due to respiratory motion and Image acquisition challenging due to patient body habitus. Global longitudinal strain was attempted. IMPRESSIONS  1. No thrombus seen with Definity contrast. Left ventricular ejection fraction, by estimation, is 25 to 30%. Left ventricular ejection fraction by 3D volume is 26 %. The left ventricle has severely decreased function. The left ventricle demonstrates global hypokinesis. Left ventricular diastolic function could not be evaluated. There is the interventricular septum is flattened in diastole ('D' shaped left ventricle), consistent with right ventricular volume overload. The average left ventricular global longitudinal strain is -6.8 %. The global longitudinal strain is abnormal.  2. Right ventricular systolic function is low normal. The right ventricular size is mildly enlarged. There is moderately elevated pulmonary artery systolic pressure.  3. Left atrial size was severely dilated.  4. Right atrial size was severely dilated.  5. The mitral valve is normal in structure. Mild to moderate mitral valve regurgitation. Moderate mitral annular calcification.  6. Tricuspid valve regurgitation is severe.  7. The aortic valve is  tricuspid. There is mild  calcification of the aortic valve. Aortic valve regurgitation is trivial. Aortic valve sclerosis/calcification is present, without any evidence of aortic stenosis.  8. The inferior vena cava is dilated in size with <50% respiratory variability, suggesting right atrial pressure of 15 mmHg. Comparison(s): No significant change from prior study. Prior images reviewed side by side. TR was severe on the previous TEE as well (triangular jet with "v-wave cutoff" was seen on that study). FINDINGS  Left Ventricle: No thrombus seen with Definity contrast. Left ventricular ejection fraction, by estimation, is 25 to 30%. Left ventricular ejection fraction by 3D volume is 26 %. The left ventricle has severely decreased function. The left ventricle demonstrates global hypokinesis. Definity contrast agent was given IV to delineate the left ventricular endocardial borders. The average left ventricular global longitudinal strain is -6.8 %. The global longitudinal strain is abnormal. The left ventricular internal cavity size was normal in size. There is no left ventricular hypertrophy. The interventricular septum is flattened in diastole ('D' shaped left ventricle), consistent with right ventricular volume overload. Left ventricular diastolic  function could not be evaluated due to atrial fibrillation. Left ventricular diastolic function could not be evaluated. Right Ventricle: The right ventricular size is mildly enlarged. No increase in right ventricular wall thickness. Right ventricular systolic function is low normal. There is moderately elevated pulmonary artery systolic pressure. The tricuspid regurgitant  velocity is 2.87 m/s, and with an assumed right atrial pressure of 15 mmHg, the estimated right ventricular systolic pressure is 20.2 mmHg. Left Atrium: Left atrial size was severely dilated. Right Atrium: Right atrial size was severely dilated. Pericardium: There is no evidence of pericardial  effusion. Mitral Valve: The mitral valve is normal in structure. Moderate mitral annular calcification. Mild to moderate mitral valve regurgitation, with centrally-directed jet. MV peak gradient, 9.2 mmHg. The mean mitral valve gradient is 4.0 mmHg. Tricuspid Valve: The tricuspid valve is normal in structure. Tricuspid valve regurgitation is severe. Aortic Valve: The aortic valve is tricuspid. There is mild calcification of the aortic valve. Aortic valve regurgitation is trivial. Aortic valve sclerosis/calcification is present, without any evidence of aortic stenosis. Pulmonic Valve: The pulmonic valve was normal in structure. Pulmonic valve regurgitation is not visualized. Aorta: The aortic root and ascending aorta are structurally normal, with no evidence of dilitation. Venous: The inferior vena cava is dilated in size with less than 50% respiratory variability, suggesting right atrial pressure of 15 mmHg. The inferior vena cava and the hepatic vein show a pattern of systolic flow reversal, suggestive of tricuspid regurgitation. IAS/Shunts: No atrial level shunt detected by color flow Doppler. Additional Comments: A device lead is visualized in the right ventricle.  LEFT VENTRICLE PLAX 2D LVIDd:         5.50 cm         Diastology LVIDs:         4.50 cm         LV e' medial:    10.10 cm/s LV PW:         1.20 cm         LV E/e' medial:  14.2 LV IVS:        0.80 cm         LV e' lateral:   15.20 cm/s LVOT diam:     1.90 cm         LV E/e' lateral: 9.4 LV SV:         33 LV SV Index:   16  2D LVOT Area:     2.84 cm        Longitudinal                                Strain                                2D Strain GLS  -6.8 %                                Avg:                                 3D Volume EF                                LV 3D EF:    Left                                             ventricul                                             ar                                             ejection                                              fraction                                             by 3D                                             volume is                                             26 %.                                 3D Volume EF:                                3D EF:        26 %  LV EDV:       138 ml                                LV ESV:       102 ml                                LV SV:        36 ml RIGHT VENTRICLE RV S prime:     11.80 cm/s TAPSE (M-mode): 1.7 cm LEFT ATRIUM              Index        RIGHT ATRIUM           Index LA diam:        4.90 cm  2.45 cm/m   RA Area:     23.00 cm LA Vol (A2C):   110.0 ml 55.01 ml/m  RA Volume:   66.80 ml  33.41 ml/m LA Vol (A4C):   64.5 ml  32.26 ml/m LA Biplane Vol: 85.5 ml  42.76 ml/m  AORTIC VALVE             PULMONIC VALVE LVOT Vmax:   79.00 cm/s  PR End Diast Vel: 3.82 msec LVOT Vmean:  54.100 cm/s LVOT VTI:    0.115 m  AORTA Ao Root diam: 3.10 cm Ao Asc diam:  3.60 cm MITRAL VALVE                TRICUSPID VALVE MV Area (PHT): 3.91 cm     TR Peak grad:   32.9 mmHg MV Area VTI:   1.28 cm     TR Vmax:        287.00 cm/s MV Peak grad:  9.2 mmHg MV Mean grad:  4.0 mmHg     SHUNTS MV Vmax:       1.52 m/s     Systemic VTI:  0.12 m MV Vmean:      85.6 cm/s    Systemic Diam: 1.90 cm MV Decel Time: 194 msec MR Peak grad: 91.4 mmHg MR Mean grad: 56.0 mmHg MR Vmax:      478.00 cm/s MR Vmean:     349.0 cm/s MV E velocity: 143.00 cm/s MV A velocity: 50.90 cm/s MV E/A ratio:  2.81 Mihai Croitoru MD Electronically signed by Sanda Klein MD Signature Date/Time: 07/27/2022/4:34:05 PM    Final      Medications:     Scheduled Medications:  aspirin EC  81 mg Oral Daily   atorvastatin  80 mg Oral Daily   Chlorhexidine Gluconate Cloth  6 each Topical Daily   insulin aspart  0-15 Units Subcutaneous TID WC   insulin aspart  0-5 Units Subcutaneous QHS   insulin aspart  2 Units Subcutaneous TID WC   insulin glargine-yfgn   10 Units Subcutaneous Daily   levalbuterol  0.63 mg Nebulization TID   nicotine  21 mg Transdermal Daily   potassium chloride  40 mEq Oral Daily   sodium chloride flush  10-40 mL Intracatheter Q12H   sodium chloride flush  3 mL Intravenous Q12H   umeclidinium-vilanterol  1 puff Inhalation Daily    Infusions:  sodium chloride     sodium chloride Stopped (07/26/22 0932)   amiodarone 60 mg/hr (07/28/22 0831)   DOBUTamine 2.5 mcg/kg/min (07/27/22 1554)   furosemide (LASIX) 200 mg in  dextrose 5 % 100 mL (2 mg/mL) infusion 15 mg/hr (07/27/22 2151)   heparin 1,150 Units/hr (07/28/22 0709)    PRN Medications: sodium chloride, levalbuterol, ondansetron (ZOFRAN) IV, sodium chloride flush, sodium chloride flush    Patient Profile    [name] is a 71 year old with a history of DMII, COPD, HTN, chronic respiratory failure, tobacco abuse, [employer] ICD, and chronic HFrEF. EF has 25-30% for at least 5 years. Last LHC 2017, normal coronaries.    Presented with increased shortness of breath. New onset A flutter and A/C HFrEF with marked volume overload.    Assessment/Plan   1. Acute on Chronic Hypoxic Respiratory Failure --> Multifactorial AECOPD and A/C HFrEF.  -Started on solumedrol. + inhalers. -Chronically on 2-3 liters [place]. Placed on Bipap on arrival.  - Now on nasal cannula.  - Attempt to diurese with IV lasix.   - SARS2 negative - CTA - negative PE.  - Improving with diuresis.    2. A/C HFrEF-->Cardiogenic Shock - NICM LHC 2017 normal cors. - 2022 Echo EF 25-30% which has been down for many years.  - Echo this admit LVEF 25-30%, D shaped LV, RV mildly dilated w/ mod elevated RVSP, RV systolic fx mildly reduced, severe BAE, severe TR, mild-mod MR, IVC dilated, assumed RAP 15  - Continue DBA 2.5 mcg. Co-ox 67%  - CVP 20. Needs aggressive diuresis. Increase lasix to 20 mg/hr and give 2.5 mg metolazone.  - Hold off GDMT with AKI and decompensation.  - Add unna boots.    3. New Onset A  flutter RVR - Of note had TEE 06/2022- no thrombus noted.  -Continue heparin drip. Start eliquis after stabilize.    -Back in SR today. Continue IV amio today.    4. AKI  -Creatinine baseline ~ 1.2  -On admit 1.7-->today 1.97 ->2.09 -Co-ox ok on DBA -Continue diuresis. May need RHC if SCr continues to rend up    5. DMII, uncontrolled  - On steroids. - On SSI    6. Obesity    Ambulate.   Length of Stay: [address] Ladoris Gene  07/28/2022, 8:39 AM  Advanced Heart Failure Team Pager [phone] (M-F; 7a - 5p)  Please contact [facility] for night-coverage after hours (5p -7a ) and weekends on [email or web address]  Patient seen and examined with the above-signed Advanced Practice Provider and/or Housestaff. I personally reviewed laboratory data, imaging studies and relevant notes. I independently examined the patient and formulated the important aspects of the plan. I have edited the note to reflect any of my changes or salient points. I have personally discussed the plan with the patient and/or family.  Remains in NSR on IV. Co-ox ok on DBA. Remains markedly volume overloaded. Diuresis c/b AKI.   General:  Weak appearing. No resp difficulty HEENT: normal Neck: supple. JVP to ear. Carotids 2+ bilat; no bruits. No lymphadenopathy or thryomegaly appreciated. Cor: PMI nondisplaced. Regular rate & rhythm. No rubs, gallops or murmurs. Lungs: clear Abdomen: soft, nontender, nondistended. No hepatosplenomegaly. No bruits or masses. Good bowel sounds. Extremities: no cyanosis, clubbing, rash, 2-3+ edema Neuro: alert & orientedx3, cranial nerves grossly intact. moves all 4 extremities w/o difficulty. Affect pleasant  Very tenuous. Continue DBA for support. Increase diuretics. Watch renal function closely. Place UNNA boots. Hope to avoid need for HD.   Glori Bickers, MD  11:15 AM

## 2022-07-28 NOTE — Progress Notes (Signed)
Orthopedic Tech Progress Note Patient Details:  [name] 22-Nov-1950 [id number]  Ortho Devices Type of Ortho Device: Louretta Parma boot Ortho Device/Splint Location: BLE Ortho Device/Splint Interventions: Ordered, Application   Post Interventions Patient Tolerated: Well  [name] A Sakeenah Valcarcel 07/28/2022, 4:55 PM

## 2022-07-28 NOTE — Progress Notes (Addendum)
ANTICOAGULATION CONSULT NOTE Pharmacy Consult for heparin Indication: atrial fibrillation  Allergies  Allergen Reactions   Actos [Pioglitazone] Other (See Comments)    congestive heart failure    Naproxen Other (See Comments)    '500mg'$  dose made her sleep for two days   Rosiglitazone Hives   Tomato Itching   Hydrocodone-Acetaminophen Itching   Hydrocortisone Hives and Itching    Patient Measurements: Weight: 100.8 kg (222 lb 4.8 oz) Heparin Dosing Weight: 100kg  Vital Signs: Temp: 97.4 F (36.3 C) (10/25 2022) Temp Source: Oral (10/25 2022) BP: 120/52 (10/26 0008) Pulse Rate: 73 (10/26 0010)  Labs: Recent Labs    07/25/22 1320 07/25/22 1325 07/25/22 1600 07/26/22 0314 07/26/22 1046 07/26/22 1338 07/27/22 0500 07/27/22 1445 07/28/22 0025  HGB 12.5   < >  --  10.9* 12.9  --  10.3*  --   --   HCT 41.6   < >  --  35.1* 38.0  --  33.0*  --   --   PLT 275  --   --  199  --   --  207  --   --   HEPARINUNFRC  --   --   --  0.16*  --    < > 0.96* 0.80* 0.15*  CREATININE 1.74*  --   --  1.95*  --   --  1.97*  --   --   TROPONINIHS 49*  --  48*  --   --   --   --   --   --    < > = values in this interval not displayed.     Estimated Creatinine Clearance: 30.4 mL/min (A) (by C-G formula based on SCr of 1.97 mg/dL (H)).  Assessment: 71 y.o. female with Afib for heparin  PM UPDATE: - Heparin level subtherapeutic at 0.15 after multiple rate decreases yesterday, CrCl ~30 mL/min  - No bleeding reported, but RN stated that the infusion pump has been beeping most of her shift. The IV was repositioned shortly after midnight and no issues currently. Will continue at current rate without bolus.   Goal of Therapy:  Heparin level 0.3-0.7 units/ml Monitor platelets by anticoagulation protocol: Yes   Plan:  Continue heparin gtt at 1150 units/hr Check heparin level in 8 hours.   Georga Bora, PharmD Clinical Pharmacist 07/28/2022 1:46 AM Please check AMION for all [facility] numbers

## 2022-07-28 NOTE — Care Management Important Message (Signed)
Important Message  Patient Details  Name: [name] MRN: [id number] Date of Birth: [birth year]   Medicare Important Message Given:  Yes     Shelda Altes 07/28/2022, 11:01 AM

## 2022-07-28 NOTE — Progress Notes (Signed)
ANTICOAGULATION CONSULT NOTE  Pharmacy Consult for heparin Indication: atrial fibrillation  Allergies  Allergen Reactions   Actos [Pioglitazone] Other (See Comments)    congestive heart failure    Naproxen Other (See Comments)    513m dose made her sleep for two days   Rosiglitazone Hives   Tomato Itching   Hydrocodone-Acetaminophen Itching   Hydrocortisone Hives and Itching    Patient Measurements: Weight: 100.2 kg (220 lb 12.8 oz) Heparin Dosing Weight: 100kg  Vital Signs: Temp: 97.5 F (36.4 C) (10/26 1500) Temp Source: Oral (10/26 1500) BP: 122/58 (10/26 1500) Pulse Rate: 72 (10/26 1500)  Labs: Recent Labs    07/26/22 0314 07/26/22 1046 07/26/22 1338 07/27/22 0500 07/27/22 1445 07/28/22 0025 07/28/22 0527 07/28/22 0743 07/28/22 1859  HGB 10.9* 12.9  --  10.3*  --   --  9.7*  --   --   HCT 35.1* 38.0  --  33.0*  --   --  30.8*  --   --   PLT 199  --   --  207  --   --  202  --   --   HEPARINUNFRC 0.16*  --    < > 0.96*   < > 0.15*  --  0.18* 0.41  CREATININE 1.95*  --   --  1.97*  --   --  2.09*  --   --    < > = values in this interval not displayed.     Estimated Creatinine Clearance: 28.5 mL/min (A) (by C-G formula based on SCr of 2.09 mg/dL (H)).   Medical History: Past Medical History:  Diagnosis Date   ATN (acute tubular necrosis) ([place] 07/15/2014   Automatic implantable cardioverter-defibrillator in situ    Automatic implantable cardioverter-defibrillator in situ 05/04/2007   Qualifier: Diagnosis of  By: CIsla Pence    Cardiac defibrillator in situ    Atlas II VR (SJM) implanted by Dr TLovena Le  CHF (congestive heart failure) ([place]    Chronic combined systolic and diastolic heart failure (HAr[place]    a. EF 35-40% in past;  b. Echo 7/13:  EF 45-50%, Gr 2 diast dysfn, mild AI, mild MAC, trivial MR, mild LAE, PASP 47.   Chronic ulcer of leg ([place]    04-09-15 resolved-not a problem.   Colon polyps 04/12/2013   Rectosigmoid polyp   COPD (chronic  obstructive pulmonary disease) (HCC)    O2 at night   Depression    Diabetes mellitus    Diabetes mellitus, type 2 ([place] 04/03/2012   HIGH RISK FEET.. Please have patient take shoes and socks off every visit for visual foot inspection.     Eczema    Elevated alkaline phosphatase level    GGT and 5'nucleotidase 8/13 normal   Health care maintenance 01/22/2013   Surgically absent cervix- no pap needed (Path report 07/2000: uterine body with attached bilateral  adnexa and separate cervix.)   History of oxygen administration    oxygen @ 2 l/m nasally bedtime 24/7   HTN (hypertension)    Hx of cardiac cath    a. [place]2003 normal;  b. LHC 6/13:  Mild calcification in the LM, o/w normal coronary arteries, EF 45%.    Hyperkalemia 08/08/2017   Hyperlipidemia    Elevated triglyceride in 2019   Hyperlipidemia associated with type 2 diabetes mellitus ([place] 05/25/2007   Qualifier: Diagnosis of  By: MHassell DoneFNP, Nykedtra     Hyperthyroidism, subclinical    HYPERTHYROIDISM, SUBCLINICAL 05/06/2009  Qualifier: Diagnosis of  By: Darrick Meigs     Implantable cardioverter-defibrillator (ICD) generator end of life    Lipoma    Need for hepatitis C screening test 04/25/2019   NICM (nonischemic cardiomyopathy) ([place])    Obesity    On home oxygen therapy    "2L; 24/7" (10/11/2016)   Post menopausal syndrome    Shortness of breath 07/14/2017   Skin rash 07/12/2018   Sleep apnea    pt denies 04/12/2013    Assessment: 60 yoF admitted with CHF and new Aflutter . No AC PTA, pharmacy to start IV heparin.CBC wnl on admit.  PM UPDATE: - Heparin level therapeutic at 0.41 - No issues with heparin infusing per RN   Goal of Therapy:  Heparin level 0.3-0.7 units/ml Monitor platelets by anticoagulation protocol: Yes   Plan:  Continue heparin at 1350 units/hr Confirmatory heparin level with AM labs  Dimple Nanas, PharmD, BCPS 07/28/2022 7:45 PM

## 2022-07-28 NOTE — Progress Notes (Signed)
ANTICOAGULATION CONSULT NOTE  Pharmacy Consult for heparin Indication: atrial fibrillation  Allergies  Allergen Reactions   Actos [Pioglitazone] Other (See Comments)    congestive heart failure    Naproxen Other (See Comments)    533m dose made her sleep for two days   Rosiglitazone Hives   Tomato Itching   Hydrocodone-Acetaminophen Itching   Hydrocortisone Hives and Itching    Patient Measurements: Weight: 100.2 kg (220 lb 12.8 oz) Heparin Dosing Weight: 100kg  Vital Signs: Temp: 97.2 F (36.2 C) (10/26 0822) Temp Source: Oral (10/26 0822) BP: 114/54 (10/26 0822) Pulse Rate: 77 (10/26 0822)  Labs: Recent Labs    07/25/22 1320 07/25/22 1325 07/25/22 1600 07/26/22 0314 07/26/22 1046 07/26/22 1338 07/27/22 0500 07/27/22 1445 07/28/22 0025 07/28/22 0527 07/28/22 0743  HGB 12.5   < >  --  10.9* 12.9  --  10.3*  --   --  9.7*  --   HCT 41.6   < >  --  35.1* 38.0  --  33.0*  --   --  30.8*  --   PLT 275  --   --  199  --   --  207  --   --  202  --   HEPARINUNFRC  --   --   --  0.16*  --    < > 0.96* 0.80* 0.15*  --  0.18*  CREATININE 1.74*  --   --  1.95*  --   --  1.97*  --   --  2.09*  --   TROPONINIHS 49*  --  48*  --   --   --   --   --   --   --   --    < > = values in this interval not displayed.     Estimated Creatinine Clearance: 28.5 mL/min (A) (by C-G formula based on SCr of 2.09 mg/dL (H)).   Medical History: Past Medical History:  Diagnosis Date   ATN (acute tubular necrosis) ([place] 07/15/2014   Automatic implantable cardioverter-defibrillator in situ    Automatic implantable cardioverter-defibrillator in situ 05/04/2007   Qualifier: Diagnosis of  By: CIsla Pence    Cardiac defibrillator in situ    Atlas II VR (SJM) implanted by Dr TLovena Le  CHF (congestive heart failure) ([place]    Chronic combined systolic and diastolic heart failure ([place]    a. EF 35-40% in past;  b. Echo 7/13:  EF 45-50%, Gr 2 diast dysfn, mild AI, mild MAC, trivial MR,  mild LAE, PASP 47.   Chronic ulcer of leg ([facility]    04-09-15 resolved-not a problem.   Colon polyps 04/12/2013   Rectosigmoid polyp   COPD (chronic obstructive pulmonary disease) (HCC)    O2 at night   Depression    Diabetes mellitus    Diabetes mellitus, type 2 (HB[place] 04/03/2012   HIGH RISK FEET.. Please have patient take shoes and socks off every visit for visual foot inspection.     Eczema    Elevated alkaline phosphatase level    GGT and 5'nucleotidase 8/13 normal   Health care maintenance 01/22/2013   Surgically absent cervix- no pap needed (Path report 07/2000: uterine body with attached bilateral  adnexa and separate cervix.)   History of oxygen administration    oxygen @ 2 l/m nasally bedtime 24/7   HTN (hypertension)    Hx of cardiac cath    a. [place]2003 normal;  b. LHC 6/13:  Mild calcification in  the LM, o/w normal coronary arteries, EF 45%.    Hyperkalemia 08/08/2017   Hyperlipidemia    Elevated triglyceride in 2019   Hyperlipidemia associated with type 2 diabetes mellitus ([place]) 05/25/2007   Qualifier: Diagnosis of  By: Hassell Done FNP, Nykedtra     Hyperthyroidism, subclinical    HYPERTHYROIDISM, SUBCLINICAL 05/06/2009   Qualifier: Diagnosis of  By: Darrick Meigs     Implantable cardioverter-defibrillator (ICD) generator end of life    Lipoma    Need for hepatitis C screening test 04/25/2019   NICM (nonischemic cardiomyopathy) ([place])    Obesity    On home oxygen therapy    "2L; 24/7" (10/11/2016)   Post menopausal syndrome    Shortness of breath 07/14/2017   Skin rash 07/12/2018   Sleep apnea    pt denies 04/12/2013    Assessment: 70 yoF admitted with CHF and new Aflutter . No AC PTA, pharmacy to start IV heparin.CBC wnl on admit.  Heparin infusing via PIV, level drawn from PICC. Heparin level low at 0.18, CBC stable.   Goal of Therapy:  Heparin level 0.3-0.7 units/ml Monitor platelets by anticoagulation protocol: Yes   Plan:  Increase heparin to 1350 units/h Check  heparin level in 8h  Arrie Senate, PharmD, [place], [facility] Clinical Pharmacist [phone] Please check AMION for all [place] numbers 07/28/2022

## 2022-07-28 NOTE — TOC Initial Note (Addendum)
Transition of Care [facility]) - Initial/Assessment Note    Patient Details  Name: [name] MRN: [id number] Date of Birth: 03/08/1951  Transition of Care [facility]) CM/SW Contact:    Erenest Rasher, RN Phone Number: [phone] 07/28/2022, 5:24 PM  Clinical Narrative:      07/29/2022 Notified [employer] rep, Kerry Dory and pt is active with [facility]. Attending updated. Will need HH orders with [facility]. Faxed  PCS form to pt's PCP, Dr Alton Revere to complete. Pt has Medicaid and may qualify for in home aide.              HF TOC CM spoke to pt and dtr at bedside. Dtr states she has a [facility] NP that visits and CM. Pt is currently active with [facility]. Will need resumption of care orders for [facility], OT and PT with [facility]. Dtr reports she will need an in home aide. Pt does have Medicaid. Provided her with information on [facility] services.   Expected Discharge Plan: [place] Barriers to Discharge: Continued Medical Work up   Patient Goals and CMS Choice Patient states their goals for this hospitalization and ongoing recovery are:: be able to care for herself in her home CMS Medicare.gov Compare Post Acute Care list provided to:: Patient Choice offered to / list presented to : Patient  Expected Discharge Plan and Services Expected Discharge Plan: [place]   Discharge Planning Services: CM Consult Post Acute Care Choice: [place] arrangements for the past 2 months: Single Family Home                                      Prior Living Arrangements/Services Living arrangements for the past 2 months: Single Family Home Lives with:: Self Patient language and need for interpreter reviewed:: Yes Do you feel safe going back to the place where you live?: Yes      Need for Family Participation in Patient Care: Yes (Comment) Care giver support system in place?: Yes (comment) Current home services: DME (Rollator, tub bench, oxygen ([employer]))     Activities of Daily Living      Permission Sought/Granted Permission sought to share information with : Case Manager, Family Supports, PCP Permission granted to share information with : Yes, Verbal Permission Granted  Share Information with NAME: Sindy Messing     Permission granted to share info w Relationship: daughter  Permission granted to share info w Contact Information: [phone]  Emotional Assessment Appearance:: Appears stated age Attitude/Demeanor/Rapport: Engaged Affect (typically observed): Accepting Orientation: : Oriented to Self, Oriented to Place, Oriented to  Time, Oriented to Situation   Psych Involvement: No (comment)  Admission diagnosis:  CHF (congestive heart failure) (HCC) [I50.9] Atrial flutter with rapid ventricular response (HCC) [I48.92] AKI (acute kidney injury) ([place]) [N17.9] Heart failure ([place]) [I50.9] Acute on chronic congestive heart failure, unspecified heart failure type [facility]) [I50.9] Patient Active Problem List   Diagnosis Date Noted   Heart failure ([place]) 07/26/2022   Acute kidney injury superimposed on chronic kidney disease ([place]) 33/82/5053   Acute metabolic encephalopathy 97/67/3419   COPD (chronic obstructive pulmonary disease) ([place]) 07/26/2022   Type 2 diabetes mellitus with hyperlipidemia ([place]) 07/26/2022   Obesity (BMI 30-39.9) 07/26/2022   Atrial flutter with rapid ventricular response ([place]) 07/25/2022   CHF (congestive heart failure) ([place]) 07/25/2022   Multiple thyroid nodules 07/04/2022  Multiple falls 07/04/2022   Hepatic steatosis 07/28/2020   Hyperlipidemia    Osteoporosis 02/12/2020   Intrinsic atopic dermatitis 09/19/2019   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   Acute on chronic systolic CHF (congestive heart failure) ([place]) 03/18/2016   AKI (acute kidney injury) ([place])    Spinal stenosis of lumbar region 12/09/2015   Restrictive lung disease 06/24/2015   Diabetic gastroparesis associated with type 2  diabetes mellitus ([place]) 07/12/2014   Depression 05/22/2014   Uncontrolled type 2 diabetes mellitus with hyperglycemia ([place]) 04/03/2012   Tobacco use disorder 10/26/2009   Essential hypertension 05/25/2007   ICD (implantable cardioverter-defibrillator) in place 05/04/2007   PCP:  [name], MD Pharmacy:   [place], [address] [place] [address] Phone: [phone] Fax: [phone]  [facility] [address] [place] [address] Phone: [phone] Fax: [phone]     Social Determinants of Health (SDOH) Interventions    Readmission Risk Interventions    06/22/2022   11:43 AM  Readmission Risk Prevention Plan  Transportation Screening Complete  PCP or Specialist Appt within 5-7 Days Complete  Home Care Screening Complete  Medication Review (RN CM) Referral to Pharmacy

## 2022-07-28 NOTE — Progress Notes (Signed)
PROGRESS NOTE    [name]  ZGY:[id number] DOB: [birth year] DOA: 07/25/2022 PCP: [name], MD  71/F with history of chronic systolic CHF, EF 59-16%, COPD, chronic respiratory failure on 2 L home O2, tobacco use, type 2 diabetes mellitus, hypertension presented to the ED with increased shortness of breath. -Noted to be in atrial flutter with volume overload, then complicated by cardiogenic shock -Started amiodarone, converted to sinus -Advanced heart failure team following, started on dobutamine 10/24 and diuretics -10/25 started on Lasix gtt.   Subjective: -Feels okay overall, tired  Assessment and Plan:  Acute on chronic systolic CHF  Cardiogenic shock, low output state Echo EF 25 to 30%, septal dyskinesis, RV with mild reduction in systolic function, moderate dilatation of RA, moderate TR.  -Advanced heart failure team following, started on dobutamine 10/24 with diuretics -Poor response thus far, increasing Lasix gtt. and repeat metolazone today -Per heart failure team, GDMT limited by hypotension and AKI -Poor dietary compliance noted as well, eats fast food several times a week and canned food too -Diet consult  Atrial flutter with rapid ventricular response ([name]) -Started on IV amiodarone, in sinus rhythm now, continue IV heparin  Acute kidney injury superimposed on chronic kidney disease ([name]) CKD stage 3a -Worsened in the setting of cardiogenic shock -Monitor, on inotropes now, avoid hypotension  Acute metabolic encephalopathy -Likely from cardiogenic shock, hypoperfusion -Improving  COPD (chronic obstructive pulmonary disease) (HCC) -Off systemic steroids, continue Xopenex, Anoro Ellipta  Type 2 diabetes mellitus with hyperlipidemia ([name]) -Improving, continue glargine, meal coverage  Obesity (BMI 30-39.9) Calculate BMI    DVT prophylaxis: IV heparin Code Status: Full code Family Communication: None present Disposition Plan:   Consultants: CHF  team   Procedures:   Antimicrobials:    Objective: Vitals:   07/28/22 0537 07/28/22 0808 07/28/22 0822 07/28/22 0906  BP:  (!) 130/57 (!) 114/54   Pulse:  83 77   Resp:   (!) 24   Temp:  97.6 F (36.4 C) (!) 97.2 F (36.2 C)   TempSrc:  Oral Oral   SpO2:  100% 98% 100%  Weight: 100.2 kg       Intake/Output Summary (Last 24 hours) at 07/28/2022 1152 Last data filed at 07/28/2022 0500 Gross per 24 hour  Intake 368.11 ml  Output 1400 ml  Net -1031.89 ml   Filed Weights   07/27/22 0545 07/28/22 0537  Weight: 100.8 kg 100.2 kg    Examination:  General exam: Obese chronically ill female sitting up in bed, AAOx3, no distress HEENT: Positive JVD CVS: S1-S2, regular rhythm Lungs: Decreased breath sounds at the bases Abdomen: Soft, obese, nontender, abdominal wall edema noted Extremities: 2+ edema Skin: No rashes Psychiatry:  Mood & affect appropriate.     Data Reviewed:   CBC: Recent Labs  Lab 07/25/22 1320 07/25/22 1325 07/26/22 0314 07/26/22 1046 07/27/22 0500 07/28/22 0527  WBC 8.5  --  7.8  --  12.6* 9.1  NEUTROABS 6.3  --   --   --   --   --   HGB 12.5 14.3 10.9* 12.9 10.3* 9.7*  HCT 41.6 42.0 35.1* 38.0 33.0* 30.8*  MCV 85.6  --  84.8  --  84.4 83.7  PLT 275  --  199  --  207 384   Basic Metabolic Panel: Recent Labs  Lab 07/25/22 1318 07/25/22 1320 07/25/22 1320 07/25/22 1325 07/26/22 0314 07/26/22 1046 07/27/22 0500 07/28/22 0527  NA  --  138   < > 138  136 135 137 136  K  --  4.8   < > 4.9 5.0 4.5 4.2 4.7  CL  --  99  --   --  98  --  97* 96*  CO2  --  30  --   --  28  --  28 30  GLUCOSE  --  311*  --   --  413*  --  197* 98  BUN  --  34*  --   --  35*  --  43* 44*  CREATININE  --  1.74*  --   --  1.95*  --  1.97* 2.09*  CALCIUM  --  8.8*  --   --  8.3*  --  8.3* 8.5*  MG 2.1  --   --   --   --   --   --  2.0   < > = values in this interval not displayed.   GFR: Estimated Creatinine Clearance: 28.5 mL/min (A) (by C-G formula based  on SCr of 2.09 mg/dL (H)). Liver Function Tests: Recent Labs  Lab 07/25/22 1320  AST 45*  ALT 36  ALKPHOS 266*  BILITOT 1.6*  PROT 7.4  ALBUMIN 2.8*   No results for input(s): "LIPASE", "AMYLASE" in the last 168 hours. No results for input(s): "AMMONIA" in the last 168 hours. Coagulation Profile: No results for input(s): "INR", "PROTIME" in the last 168 hours. Cardiac Enzymes: No results for input(s): "CKTOTAL", "CKMB", "CKMBINDEX", "TROPONINI" in the last 168 hours. BNP (last 3 results) No results for input(s): "PROBNP" in the last 8760 hours. HbA1C: No results for input(s): "HGBA1C" in the last 72 hours. CBG: Recent Labs  Lab 07/27/22 0837 07/27/22 1223 07/27/22 1639 07/27/22 2101 07/28/22 0747  GLUCAP 190* 214* 190* 119* 101*   Lipid Profile: No results for input(s): "CHOL", "HDL", "LDLCALC", "TRIG", "CHOLHDL", "LDLDIRECT" in the last 72 hours. Thyroid Function Tests: No results for input(s): "TSH", "T4TOTAL", "FREET4", "T3FREE", "THYROIDAB" in the last 72 hours. Anemia Panel: No results for input(s): "VITAMINB12", "FOLATE", "FERRITIN", "TIBC", "IRON", "RETICCTPCT" in the last 72 hours. Urine analysis:    Component Value Date/Time   COLORURINE YELLOW 06/26/2022 [place] 06/26/2022 1725   LABSPEC 1.020 06/26/2022 1725   PHURINE 5.5 06/26/2022 1725   GLUCOSEU 100 (A) 06/26/2022 1725   HGBUR NEGATIVE 06/26/2022 1725   HGBUR negative 11/08/2010 0854   BILIRUBINUR NEGATIVE 06/26/2022 1725   KETONESUR NEGATIVE 06/26/2022 1725   PROTEINUR NEGATIVE 06/26/2022 1725   UROBILINOGEN 1.0 01/26/2015 2028   NITRITE NEGATIVE 06/26/2022 1725   LEUKOCYTESUR NEGATIVE 06/26/2022 1725   Sepsis Labs: '@LABRCNTIP'$ (procalcitonin:4,lacticidven:4)  ) Recent Results (from the past 240 hour(s))  Resp Panel by RT-PCR (Flu A&B, Covid) Anterior Nasal Swab     Status: None   Collection Time: 07/25/22  1:21 PM   Specimen: Anterior Nasal Swab  Result Value Ref Range  Status   SARS Coronavirus 2 by RT PCR NEGATIVE NEGATIVE Final    Comment: (NOTE) SARS-CoV-2 target nucleic acids are NOT DETECTED.  The SARS-CoV-2 RNA is generally detectable in upper respiratory specimens during the acute phase of infection. The lowest concentration of SARS-CoV-2 viral copies this assay can detect is 138 copies/mL. A negative result does not preclude SARS-Cov-2 infection and should not be used as the sole basis for treatment or other patient management decisions. A negative result may occur with  improper specimen collection/handling, submission of specimen other than nasopharyngeal swab, presence of viral mutation(s) within the areas targeted  by this assay, and inadequate number of viral copies(<138 copies/mL). A negative result must be combined with clinical observations, patient history, and epidemiological information. The expected result is Negative.  Fact Sheet for Patients:  EntrepreneurPulse.com.au  Fact Sheet for Healthcare Providers:  IncredibleEmployment.be  This test is no t yet approved or cleared by the [place] FDA and  has been authorized for detection and/or diagnosis of SARS-CoV-2 by FDA under an Emergency Use Authorization (EUA). This EUA will remain  in effect (meaning this test can be used) for the duration of the COVID-19 declaration under Section 564(b)(1) of the Act, 21 U.S.C.section 360bbb-3(b)(1), unless the authorization is terminated  or revoked sooner.       Influenza A by PCR NEGATIVE NEGATIVE Final   Influenza B by PCR NEGATIVE NEGATIVE Final    Comment: (NOTE) The Xpert Xpress SARS-CoV-2/FLU/RSV plus assay is intended as an aid in the diagnosis of influenza from Nasopharyngeal swab specimens and should not be used as a sole basis for treatment. Nasal washings and aspirates are unacceptable for Xpert Xpress SARS-CoV-2/FLU/RSV testing.  Fact Sheet for  Patients: EntrepreneurPulse.com.au  Fact Sheet for Healthcare Providers: IncredibleEmployment.be  This test is not yet approved or cleared by the [place] FDA and has been authorized for detection and/or diagnosis of SARS-CoV-2 by FDA under an Emergency Use Authorization (EUA). This EUA will remain in effect (meaning this test can be used) for the duration of the COVID-19 declaration under Section 564(b)(1) of the Act, 21 U.S.C. section 360bbb-3(b)(1), unless the authorization is terminated or revoked.  Performed at [facility], [place] [address] [place], [place] [address]      Radiology Studies: ECHOCARDIOGRAM COMPLETE  Result Date: 07/27/2022    ECHOCARDIOGRAM REPORT   Patient Name:   [name] Date of Exam: 07/27/2022 Medical Rec #:  [id number]    Height:       62.0 in Accession #:    [id number]   Weight:       222.3 lb Date of Birth:  Aug 02, 1951   BSA:          2.000 m Patient Age:    21 years     BP:           113/53 mmHg Patient Gender: F            HR:           77 bpm. Exam Location:  Inpatient Procedure: 2D Echo, 3D Echo, Strain Analysis, Intracardiac Opacification Agent,            Cardiac Doppler and Color Doppler Indications:    CHF-Acute Systolic O13.08  History:        Patient has prior history of Echocardiogram examinations, most                 recent 06/16/2021. CHF, ICD, Arrythmias:Atrial Flutter,                 Signs/Symptoms:Shortness of Breath; Risk Factors:Hypertension,                 Diabetes, Dyslipidemia, Current Smoker and Sleep Apnea.  Sonographer:    Greer Pickerel Referring Phys: [id number] AMY D CLEGG  Sonographer Comments: Image acquisition challenging due to COPD, Image acquisition challenging due to respiratory motion and Image acquisition challenging due to patient body habitus. Global longitudinal strain was attempted. IMPRESSIONS  1. No thrombus seen with Definity contrast. Left ventricular ejection fraction, by  estimation, is 25 to 30%. Left  ventricular ejection fraction by 3D volume is 26 %. The left ventricle has severely decreased function. The left ventricle demonstrates global hypokinesis. Left ventricular diastolic function could not be evaluated. There is the interventricular septum is flattened in diastole ('D' shaped left ventricle), consistent with right ventricular volume overload. The average left ventricular global longitudinal strain is -6.8 %. The global longitudinal strain is abnormal.  2. Right ventricular systolic function is low normal. The right ventricular size is mildly enlarged. There is moderately elevated pulmonary artery systolic pressure.  3. Left atrial size was severely dilated.  4. Right atrial size was severely dilated.  5. The mitral valve is normal in structure. Mild to moderate mitral valve regurgitation. Moderate mitral annular calcification.  6. Tricuspid valve regurgitation is severe.  7. The aortic valve is tricuspid. There is mild calcification of the aortic valve. Aortic valve regurgitation is trivial. Aortic valve sclerosis/calcification is present, without any evidence of aortic stenosis.  8. The inferior vena cava is dilated in size with <50% respiratory variability, suggesting right atrial pressure of 15 mmHg. Comparison(s): No significant change from prior study. Prior images reviewed side by side. TR was severe on the previous TEE as well (triangular jet with "v-wave cutoff" was seen on that study). FINDINGS  Left Ventricle: No thrombus seen with Definity contrast. Left ventricular ejection fraction, by estimation, is 25 to 30%. Left ventricular ejection fraction by 3D volume is 26 %. The left ventricle has severely decreased function. The left ventricle demonstrates global hypokinesis. Definity contrast agent was given IV to delineate the left ventricular endocardial borders. The average left ventricular global longitudinal strain is -6.8 %. The global longitudinal strain is  abnormal. The left ventricular internal cavity size was normal in size. There is no left ventricular hypertrophy. The interventricular septum is flattened in diastole ('D' shaped left ventricle), consistent with right ventricular volume overload. Left ventricular diastolic  function could not be evaluated due to atrial fibrillation. Left ventricular diastolic function could not be evaluated. Right Ventricle: The right ventricular size is mildly enlarged. No increase in right ventricular wall thickness. Right ventricular systolic function is low normal. There is moderately elevated pulmonary artery systolic pressure. The tricuspid regurgitant  velocity is 2.87 m/s, and with an assumed right atrial pressure of 15 mmHg, the estimated right ventricular systolic pressure is 09.3 mmHg. Left Atrium: Left atrial size was severely dilated. Right Atrium: Right atrial size was severely dilated. Pericardium: There is no evidence of pericardial effusion. Mitral Valve: The mitral valve is normal in structure. Moderate mitral annular calcification. Mild to moderate mitral valve regurgitation, with centrally-directed jet. MV peak gradient, 9.2 mmHg. The mean mitral valve gradient is 4.0 mmHg. Tricuspid Valve: The tricuspid valve is normal in structure. Tricuspid valve regurgitation is severe. Aortic Valve: The aortic valve is tricuspid. There is mild calcification of the aortic valve. Aortic valve regurgitation is trivial. Aortic valve sclerosis/calcification is present, without any evidence of aortic stenosis. Pulmonic Valve: The pulmonic valve was normal in structure. Pulmonic valve regurgitation is not visualized. Aorta: The aortic root and ascending aorta are structurally normal, with no evidence of dilitation. Venous: The inferior vena cava is dilated in size with less than 50% respiratory variability, suggesting right atrial pressure of 15 mmHg. The inferior vena cava and the hepatic vein show a pattern of systolic flow  reversal, suggestive of tricuspid regurgitation. IAS/Shunts: No atrial level shunt detected by color flow Doppler. Additional Comments: A device lead is visualized in the right ventricle.  LEFT VENTRICLE  PLAX 2D LVIDd:         5.50 cm         Diastology LVIDs:         4.50 cm         LV e' medial:    10.10 cm/s LV PW:         1.20 cm         LV E/e' medial:  14.2 LV IVS:        0.80 cm         LV e' lateral:   15.20 cm/s LVOT diam:     1.90 cm         LV E/e' lateral: 9.4 LV SV:         33 LV SV Index:   16              2D LVOT Area:     2.84 cm        Longitudinal                                Strain                                2D Strain GLS  -6.8 %                                Avg:                                 3D Volume EF                                LV 3D EF:    Left                                             ventricul                                             ar                                             ejection                                             fraction                                             by 3D  volume is                                             26 %.                                 3D Volume EF:                                3D EF:        26 %                                LV EDV:       138 ml                                LV ESV:       102 ml                                LV SV:        36 ml RIGHT VENTRICLE RV S prime:     11.80 cm/s TAPSE (M-mode): 1.7 cm LEFT ATRIUM              Index        RIGHT ATRIUM           Index LA diam:        4.90 cm  2.45 cm/m   RA Area:     23.00 cm LA Vol (A2C):   110.0 ml 55.01 ml/m  RA Volume:   66.80 ml  33.41 ml/m LA Vol (A4C):   64.5 ml  32.26 ml/m LA Biplane Vol: 85.5 ml  42.76 ml/m  AORTIC VALVE             PULMONIC VALVE LVOT Vmax:   79.00 cm/s  PR End Diast Vel: 3.82 msec LVOT Vmean:  54.100 cm/s LVOT VTI:    0.115 m  AORTA Ao Root diam: 3.10 cm Ao Asc diam:  3.60 cm MITRAL  VALVE                TRICUSPID VALVE MV Area (PHT): 3.91 cm     TR Peak grad:   32.9 mmHg MV Area VTI:   1.28 cm     TR Vmax:        287.00 cm/s MV Peak grad:  9.2 mmHg MV Mean grad:  4.0 mmHg     SHUNTS MV Vmax:       1.52 m/s     Systemic VTI:  0.12 m MV Vmean:      85.6 cm/s    Systemic Diam: 1.90 cm MV Decel Time: 194 msec MR Peak grad: 91.4 mmHg MR Mean grad: 56.0 mmHg MR Vmax:      478.00 cm/s MR Vmean:     349.0 cm/s MV E velocity: 143.00 cm/s MV A velocity: 50.90 cm/s MV E/A ratio:  2.81 Mihai Croitoru MD Electronically signed by Sanda Klein MD Signature Date/Time: 07/27/2022/4:34:05 PM    Final      Scheduled Meds:  aspirin EC  81 mg Oral Daily   atorvastatin  80 mg Oral Daily   Chlorhexidine Gluconate Cloth  6 each Topical Daily   insulin aspart  0-15 Units Subcutaneous TID WC   insulin aspart  0-5 Units Subcutaneous QHS   insulin aspart  2 Units Subcutaneous TID WC   insulin glargine-yfgn  10 Units Subcutaneous Daily   levalbuterol  0.63 mg Nebulization TID   nicotine  21 mg Transdermal Daily   potassium chloride  40 mEq Oral Daily   sodium chloride flush  10-40 mL Intracatheter Q12H   sodium chloride flush  3 mL Intravenous Q12H   umeclidinium-vilanterol  1 puff Inhalation Daily   Continuous Infusions:  sodium chloride     sodium chloride Stopped (07/26/22 0932)   amiodarone 60 mg/hr (07/28/22 0831)   DOBUTamine 2.5 mcg/kg/min (07/27/22 1554)   furosemide (LASIX) 200 mg in dextrose 5 % 100 mL (2 mg/mL) infusion 20 mg/hr (07/28/22 0919)   heparin 1,350 Units/hr (07/28/22 0918)     LOS: 3 days    Time spent: 71mn    PDomenic Polite MD [facility]   07/28/2022, 11:52 AM

## 2022-07-28 NOTE — Progress Notes (Signed)
Mobility Specialist Progress Note   07/28/22 1230  Mobility  Activity Ambulated with assistance in hallway;Dangled on edge of bed  Level of Assistance Contact guard assist, steadying assist  Assistive Device Front wheel walker  Distance Ambulated (ft) 40 ft  Range of Motion/Exercises Active;All extremities  Activity Response Tolerated well   Patient received in supine, agreeable to participate. Required minimal HHA to EOB and mod A + cues for hand placement sit to stand. Ambulated with very slow gait requiring x3 standing rest break. Distance and speed is limited by SOB and fatigue. Returned to room without complaint or incident. Was left dangling EOB with all needs met, call bell in reach.   [place] Emelia Sandoval, [place], [place]  Office: [phone]

## 2022-07-29 DIAGNOSIS — I5023 Acute on chronic systolic (congestive) heart failure: Secondary | ICD-10-CM | POA: Diagnosis not present

## 2022-07-29 LAB — CBC
HCT: 32.5 % — ABNORMAL LOW (ref 36.0–46.0)
Hemoglobin: 10.3 g/dL — ABNORMAL LOW (ref 12.0–15.0)
MCH: 25.9 pg — ABNORMAL LOW (ref 26.0–34.0)
MCHC: 31.7 g/dL (ref 30.0–36.0)
MCV: 81.7 fL (ref 80.0–100.0)
Platelets: 221 10*3/uL (ref 150–400)
RBC: 3.98 MIL/uL (ref 3.87–5.11)
RDW: 19.5 % — ABNORMAL HIGH (ref 11.5–15.5)
WBC: 7.5 10*3/uL (ref 4.0–10.5)
nRBC: 0 % (ref 0.0–0.2)

## 2022-07-29 LAB — BASIC METABOLIC PANEL
Anion gap: 12 (ref 5–15)
BUN: 41 mg/dL — ABNORMAL HIGH (ref 8–23)
CO2: 31 mmol/L (ref 22–32)
Calcium: 9.3 mg/dL (ref 8.9–10.3)
Chloride: 95 mmol/L — ABNORMAL LOW (ref 98–111)
Creatinine, Ser: 2.34 mg/dL — ABNORMAL HIGH (ref 0.44–1.00)
GFR, Estimated: 22 mL/min — ABNORMAL LOW (ref >60)
Glucose, Bld: 62 mg/dL — ABNORMAL LOW (ref 70–99)
Potassium: 4.8 mmol/L (ref 3.5–5.1)
Sodium: 138 mmol/L (ref 135–145)

## 2022-07-29 LAB — GLUCOSE, CAPILLARY
Glucose-Capillary: 75 mg/dL (ref 70–99)
Glucose-Capillary: 169 mg/dL — ABNORMAL HIGH (ref 70–99)
Glucose-Capillary: 77 mg/dL (ref 70–99)
Glucose-Capillary: 88 mg/dL (ref 70–99)

## 2022-07-29 LAB — COOXEMETRY PANEL
Carboxyhemoglobin: 2 % — ABNORMAL HIGH (ref 0.5–1.5)
Methemoglobin: 0.8 % (ref 0.0–1.5)
O2 Saturation: 68 %
Total hemoglobin: 10.9 g/dL — ABNORMAL LOW (ref 12.0–16.0)

## 2022-07-29 LAB — HEPARIN LEVEL (UNFRACTIONATED): Heparin Unfractionated: 0.27 IU/mL — ABNORMAL LOW (ref 0.30–0.70)

## 2022-07-29 MED ORDER — APIXABAN 5 MG PO TABS
5.0000 mg | ORAL_TABLET | Freq: Two times a day (BID) | ORAL | Status: DC
  Administered 2022-07-29 – 2022-08-04 (×13): 5 mg via ORAL
  Filled 2022-07-29 (×13): qty 1

## 2022-07-29 MED ORDER — METOLAZONE 5 MG PO TABS
2.5000 mg | ORAL_TABLET | Freq: Once | ORAL | Status: AC
  Administered 2022-07-29: 2.5 mg via ORAL
  Filled 2022-07-29: qty 1

## 2022-07-29 NOTE — Progress Notes (Addendum)
Advanced Heart Failure Rounding Note  PCP-Cardiologist: None   Subjective:    Admitted with A/C HFrEF and new onset A flutter. Started on amio + heparin. Lasix gtt + DBA.   Echo LVEF 25-30%, D shaped LV, RV mildly dilated w/ mod elevated RVSP, RV systolic fx mildly reduced, severe BAE, severe TR, mild-mod MR, IVC dilated, assumed RAP 15   Co-ox 68% on DBA 2.5.   On lasix gtt 20/hr. UOP picking up, 3.6L out yesterday. Wt down 4 lb. CVP 16   SCr trending up, 1.97>>2.09>>2.34. SBPs ok ~120s. Urine clear  K 4.8   Still in NSR, on amio gtt 60/hr + heparin gtt  Feeling better. No dyspnea. Wheezing resolved.    Objective:   Weight Range: 98.4 kg Body mass index is 39.67 kg/m.   Vital Signs:   Temp:  [97.5 F (36.4 C)-97.8 F (36.6 C)] 97.5 F (36.4 C) (10/27 0757) Pulse Rate:  [62-100] 70 (10/27 0757) Resp:  [18-26] 20 (10/27 0757) BP: (109-150)/(48-75) 121/65 (10/27 0757) SpO2:  [91 %-100 %] 91 % (10/27 0757) Weight:  [98.4 kg] 98.4 kg (10/27 0425) Last BM Date : 07/25/22  Weight change: Filed Weights   07/27/22 0545 07/28/22 0537 07/29/22 0425  Weight: 100.8 kg 100.2 kg 98.4 kg    Intake/Output:   Intake/Output Summary (Last 24 hours) at 07/29/2022 0942 Last data filed at 07/29/2022 0505 Gross per 24 hour  Intake --  Output 3600 ml  Net -3600 ml     Physical Exam   CVP 16 with prominent V waves up to 30   General:  obese female. No respiratory difficulty HEENT: normal Neck: supple. JVD elevated to jaw + v waves. Carotids 2+ bilat; no bruits. No lymphadenopathy or thyromegaly appreciated. Cor: PMI nondisplaced. Regular rate & rhythm. 2/6 TR murmur  Lungs: decreased BS at the bases  Abdomen: soft, nontender, nondistended. No hepatosplenomegaly. No bruits or masses. Good bowel sounds. Extremities: no cyanosis, clubbing, rash, b/l LEE up to thighs and abdomen  Neuro: alert & oriented x 3, cranial nerves grossly intact. moves all 4 extremities w/o  difficulty. Affect pleasant.  Telemetry   NSR 70s   EKG  N/A Labs    CBC Recent Labs    07/28/22 0527 07/29/22 0500  WBC 9.1 7.5  HGB 9.7* 10.3*  HCT 30.8* 32.5*  MCV 83.7 81.7  PLT 202 211   Basic Metabolic Panel Recent Labs    07/28/22 0527 07/29/22 0500  NA 136 138  K 4.7 4.8  CL 96* 95*  CO2 30 31  GLUCOSE 98 62*  BUN 44* 41*  CREATININE 2.09* 2.34*  CALCIUM 8.5* 9.3  MG 2.0  --    Liver Function Tests No results for input(s): "AST", "ALT", "ALKPHOS", "BILITOT", "PROT", "ALBUMIN" in the last 72 hours.  No results for input(s): "LIPASE", "AMYLASE" in the last 72 hours. Cardiac Enzymes No results for input(s): "CKTOTAL", "CKMB", "CKMBINDEX", "TROPONINI" in the last 72 hours.  BNP: BNP (last 3 results) Recent Labs    06/26/22 1445 07/13/22 1609 07/25/22 1321  BNP 418.1* 358.7* 461.6*    ProBNP (last 3 results) No results for input(s): "PROBNP" in the last 8760 hours.   D-Dimer No results for input(s): "DDIMER" in the last 72 hours. Hemoglobin A1C No results for input(s): "HGBA1C" in the last 72 hours. Fasting Lipid Panel No results for input(s): "CHOL", "HDL", "LDLCALC", "TRIG", "CHOLHDL", "LDLDIRECT" in the last 72 hours. Thyroid Function Tests No results for input(s): "TSH", "  T4TOTAL", "T3FREE", "THYROIDAB" in the last 72 hours.  Invalid input(s): "FREET3"  Other results:   Imaging    No results found.   Medications:     Scheduled Medications:  aspirin EC  81 mg Oral Daily   atorvastatin  80 mg Oral Daily   Chlorhexidine Gluconate Cloth  6 each Topical Daily   insulin aspart  0-15 Units Subcutaneous TID WC   insulin aspart  0-5 Units Subcutaneous QHS   insulin aspart  2 Units Subcutaneous TID WC   insulin glargine-yfgn  10 Units Subcutaneous Daily   nicotine  21 mg Transdermal Daily   potassium chloride  40 mEq Oral Daily   sodium chloride flush  10-40 mL Intracatheter Q12H   sodium chloride flush  3 mL Intravenous Q12H    umeclidinium-vilanterol  1 puff Inhalation Daily    Infusions:  sodium chloride     sodium chloride Stopped (07/26/22 0932)   amiodarone 60 mg/hr (07/29/22 0941)   DOBUTamine 2.5 mcg/kg/min (07/27/22 1554)   furosemide (LASIX) 200 mg in dextrose 5 % 100 mL (2 mg/mL) infusion 20 mg/hr (07/29/22 0935)   heparin 1,350 Units/hr (07/29/22 0524)    PRN Medications: sodium chloride, levalbuterol, ondansetron (ZOFRAN) IV, sodium chloride flush, sodium chloride flush    Patient Profile    [name] is a 71 year old with a history of DMII, COPD, HTN, chronic respiratory failure, tobacco abuse, [employer] ICD, and chronic HFrEF. EF has 25-30% for at least 5 years. Last LHC 2017, normal coronaries.    Presented with increased shortness of breath. New onset A flutter and A/C HFrEF with marked volume overload.    Assessment/Plan   1. Acute on Chronic Hypoxic Respiratory Failure --> Multifactorial AECOPD and A/C HFrEF.  -Started on solumedrol. + inhalers. -Chronically on 2-3 liters [place]. Placed on Bipap on arrival.  - Now on nasal cannula.  - Attempt to diurese with IV lasix.   - SARS2 negative - CTA - negative PE.  - Improving with diuresis.    2. A/C HFrEF-->Cardiogenic Shock - NICM LHC 2017 normal cors. - 2022 Echo EF 25-30% which has been down for many years.  - Echo this admit LVEF 25-30%, D shaped LV, RV mildly dilated w/ mod elevated RVSP, RV systolic fx mildly reduced, severe BAE, severe TR, mild-mod MR, IVC dilated, assumed RAP 15  - Continue DBA 2.5 mcg. Co-ox 67%  - CVP 16. Needs further diuresis. Continue lasix gtt 20 mg/hr and give 2.5 mg metolazone.  - Hold off GDMT with AKI and decompensation.  - c/w unna boots.    3. New Onset A flutter RVR - Of note had TEE 06/2022- no thrombus noted.  -Continue heparin drip. Start eliquis after stabilize.    -Back in SR today. Continue IV amio today.    4. AKI  -Creatinine baseline ~ 1.2  -On admit 1.7-->today 1.97 ->2.09->2.34 today   -Co-ox ok on DBA -Continue diuresis. May need RHC if SCr continues to rend up    5. DMII, uncontrolled  - On steroids. - On SSI    6. Obesity     Length of Stay: [address] PA-C  07/29/2022, 9:42 AM  Advanced Heart Failure Team Pager [phone] (M-F; 7a - 5p)  Please contact [facility] for night-coverage after hours (5p -7a ) and weekends on [email or web address]   Patient seen and examined with the above-signed Advanced Practice Provider and/or Housestaff. I personally reviewed laboratory data, imaging studies and relevant notes. I  independently examined the patient and formulated the important aspects of the plan. I have edited the note to reflect any of my changes or salient points. I have personally discussed the plan with the patient and/or family.  Remains on DBA. Co-ox ox. Diuresing well now on IV lasix gtt and metolazone. CVP finally starting to come down. Remains in NSR on IV amio  Breathing better. No CP, SOB, orthopnea or PND. No bleeding on heparin  General:  Weak appearing. No resp difficulty HEENT: normal Neck: supple. JVP to ear . Carotids 2+ bilat; no bruits. No lymphadenopathy or thryomegaly appreciated. Cor: PMI nondisplaced. Regular rate & rhythm. No rubs, gallops or murmurs. Lungs: clear Abdomen: obese soft, nontender, nondistended. No hepatosplenomegaly. No bruits or masses. Good bowel sounds. Extremities: no cyanosis, clubbing, rash, 2+ edema + uNNA Neuro: alert & orientedx3, cranial nerves grossly intact. moves all 4 extremities w/o difficulty. Affect pleasant  Remains tenuous. Continue IV diuresis with inotropic support. Follow renal function closely. Continue amio. Can switch heparin to Eliquis. PT/OT   Glori Bickers, MD  1:56 PM

## 2022-07-29 NOTE — Progress Notes (Signed)
Mobility Specialist - Progress Note   07/29/22 1237  Mobility  Activity Ambulated with assistance in hallway  Level of Assistance Minimal assist, patient does 75% or more  Assistive Device Front wheel walker  Distance Ambulated (ft) 160 ft  Activity Response Tolerated well  Mobility Referral Yes  $Mobility charge 1 Mobility   Pt received in bed and agreeable to mobility. Pt was MinA to move to the edge of bed. Pt required x1 standing break d/t SOB. Pt was left in chair with all needs met.   Larey Seat

## 2022-07-29 NOTE — Progress Notes (Signed)
PROGRESS NOTE    [name]  BJS:[id number] DOB: 1951/09/12 DOA: 07/25/2022 PCP: [name], MD  70/F with history of chronic systolic CHF, EF 60-73%, COPD, chronic respiratory failure on 2 L home O2, tobacco use, type 2 diabetes mellitus, hypertension presented to the ED with increased shortness of breath. -Noted to be in atrial flutter with volume overload, then complicated by cardiogenic shock -Started amiodarone, converted to sinus -Advanced heart failure team following, started on dobutamine 10/24 and diuretics -10/25 started on Lasix gtt.   Subjective: -Starting to feel little better, breathing improving  Assessment and Plan:  Acute on chronic systolic CHF  Cardiogenic shock, low output state Echo EF 25 to 30%, septal dyskinesis, RV with mild reduction in systolic function, moderate dilatation of RA, moderate TR.  -Advanced heart failure team following, started on dobutamine 10/24 with diuretics -Lasix GTT increased to 20 Mg per hour yesterday with metolazone, finally put out 3600 mL yesterday -Creatinine continues to trend up, may need right heart cath -Per heart failure team, GDMT limited by hypotension and AKI -Poor dietary compliance noted as well, eats fast food several times a week and canned food too -Dietician insulted -Increase activity, PT following  Atrial flutter with rapid ventricular response (HCC) -on IV amiodarone, in sinus rhythm now, on IV heparin  Acute kidney injury superimposed on chronic kidney disease ([place]) CKD stage 3a -Worsened in the setting of cardiogenic shock -Continue inotropes , avoid hypotension -Unfortunately creatinine continues to trend up slowly  Acute metabolic encephalopathy -Likely from cardiogenic shock, hypoperfusion -Improving  COPD (chronic obstructive pulmonary disease) (HCC) -Off systemic steroids, continue Xopenex, Anoro Ellipta  Type 2 diabetes mellitus with hyperlipidemia ([place]) -Improving, continue glargine, meal  coverage  Obesity (BMI 30-39.9) Calculate BMI    DVT prophylaxis: IV heparin Code Status: Full code Family Communication: None present Disposition Plan:   Consultants: CHF team   Procedures:   Antimicrobials:    Objective: Vitals:   07/29/22 0510 07/29/22 0659 07/29/22 0757 07/29/22 1136  BP:  (!) 129/48 121/65 (!) 141/77  Pulse: 62 69 70 70  Resp: '20 20 20 20  '$ Temp:   (!) 97.5 F (36.4 C) 97.6 F (36.4 C)  TempSrc:   Oral Oral  SpO2: 93% 92% 91% 98%  Weight:        Intake/Output Summary (Last 24 hours) at 07/29/2022 1142 Last data filed at 07/29/2022 0505 Gross per 24 hour  Intake --  Output 3600 ml  Net -3600 ml   Filed Weights   07/27/22 0545 07/28/22 0537 07/29/22 0425  Weight: 100.8 kg 100.2 kg 98.4 kg    Examination:  General exam: Obese chronically ill female sitting up in bed, AAOx3, no distress HEENT: Positive JVD CVS: S1-S2, regular rhythm Lungs: Decreased breath sounds to bases Abdomen: Soft, obese, nontender, abdominal wall edema Extremities: 1-2+ edema Skin: Taut skin Psychiatry:  Mood & affect appropriate.     Data Reviewed:   CBC: Recent Labs  Lab 07/25/22 1320 07/25/22 1325 07/26/22 0314 07/26/22 1046 07/27/22 0500 07/28/22 0527 07/29/22 0500  WBC 8.5  --  7.8  --  12.6* 9.1 7.5  NEUTROABS 6.3  --   --   --   --   --   --   HGB 12.5   < > 10.9* 12.9 10.3* 9.7* 10.3*  HCT 41.6   < > 35.1* 38.0 33.0* 30.8* 32.5*  MCV 85.6  --  84.8  --  84.4 83.7 81.7  PLT 275  --  199  --  207 202 221   < > = values in this interval not displayed.   Basic Metabolic Panel: Recent Labs  Lab 07/25/22 1318 07/25/22 1320 07/25/22 1325 07/26/22 0314 07/26/22 1046 07/27/22 0500 07/28/22 0527 07/29/22 0500  NA  --  138   < > 136 135 137 136 138  K  --  4.8   < > 5.0 4.5 4.2 4.7 4.8  CL  --  99  --  98  --  97* 96* 95*  CO2  --  30  --  28  --  '28 30 31  '$ GLUCOSE  --  311*  --  413*  --  197* 98 62*  BUN  --  34*  --  35*  --  43* 44*  41*  CREATININE  --  1.74*  --  1.95*  --  1.97* 2.09* 2.34*  CALCIUM  --  8.8*  --  8.3*  --  8.3* 8.5* 9.3  MG 2.1  --   --   --   --   --  2.0  --    < > = values in this interval not displayed.   GFR: Estimated Creatinine Clearance: 25.2 mL/min (A) (by C-G formula based on SCr of 2.34 mg/dL (H)). Liver Function Tests: Recent Labs  Lab 07/25/22 1320  AST 45*  ALT 36  ALKPHOS 266*  BILITOT 1.6*  PROT 7.4  ALBUMIN 2.8*   No results for input(s): "LIPASE", "AMYLASE" in the last 168 hours. No results for input(s): "AMMONIA" in the last 168 hours. Coagulation Profile: No results for input(s): "INR", "PROTIME" in the last 168 hours. Cardiac Enzymes: No results for input(s): "CKTOTAL", "CKMB", "CKMBINDEX", "TROPONINI" in the last 168 hours. BNP (last 3 results) No results for input(s): "PROBNP" in the last 8760 hours. HbA1C: No results for input(s): "HGBA1C" in the last 72 hours. CBG: Recent Labs  Lab 07/28/22 1218 07/28/22 1637 07/28/22 2202 07/29/22 0755 07/29/22 1136  GLUCAP 108* 79 88 75 169*   Lipid Profile: No results for input(s): "CHOL", "HDL", "LDLCALC", "TRIG", "CHOLHDL", "LDLDIRECT" in the last 72 hours. Thyroid Function Tests: No results for input(s): "TSH", "T4TOTAL", "FREET4", "T3FREE", "THYROIDAB" in the last 72 hours. Anemia Panel: No results for input(s): "VITAMINB12", "FOLATE", "FERRITIN", "TIBC", "IRON", "RETICCTPCT" in the last 72 hours. Urine analysis:    Component Value Date/Time   COLORURINE YELLOW 06/26/2022 [place] 06/26/2022 1725   LABSPEC 1.020 06/26/2022 1725   PHURINE 5.5 06/26/2022 1725   GLUCOSEU 100 (A) 06/26/2022 1725   HGBUR NEGATIVE 06/26/2022 1725   HGBUR negative 11/08/2010 0854   BILIRUBINUR NEGATIVE 06/26/2022 1725   KETONESUR NEGATIVE 06/26/2022 1725   PROTEINUR NEGATIVE 06/26/2022 1725   UROBILINOGEN 1.0 01/26/2015 2028   NITRITE NEGATIVE 06/26/2022 1725   LEUKOCYTESUR NEGATIVE 06/26/2022 1725    Sepsis Labs: '@LABRCNTIP'$ (procalcitonin:4,lacticidven:4)  ) Recent Results (from the past 240 hour(s))  Resp Panel by RT-PCR (Flu A&B, Covid) Anterior Nasal Swab     Status: None   Collection Time: 07/25/22  1:21 PM   Specimen: Anterior Nasal Swab  Result Value Ref Range Status   SARS Coronavirus 2 by RT PCR NEGATIVE NEGATIVE Final    Comment: (NOTE) SARS-CoV-2 target nucleic acids are NOT DETECTED.  The SARS-CoV-2 RNA is generally detectable in upper respiratory specimens during the acute phase of infection. The lowest concentration of SARS-CoV-2 viral copies this assay can detect is 138 copies/mL. A negative result does not preclude SARS-Cov-2  infection and should not be used as the sole basis for treatment or other patient management decisions. A negative result may occur with  improper specimen collection/handling, submission of specimen other than nasopharyngeal swab, presence of viral mutation(s) within the areas targeted by this assay, and inadequate number of viral copies(<138 copies/mL). A negative result must be combined with clinical observations, patient history, and epidemiological information. The expected result is Negative.  Fact Sheet for Patients:  EntrepreneurPulse.com.au  Fact Sheet for Healthcare Providers:  IncredibleEmployment.be  This test is no t yet approved or cleared by the [place] FDA and  has been authorized for detection and/or diagnosis of SARS-CoV-2 by FDA under an Emergency Use Authorization (EUA). This EUA will remain  in effect (meaning this test can be used) for the duration of the COVID-19 declaration under Section 564(b)(1) of the Act, 21 U.S.C.section 360bbb-3(b)(1), unless the authorization is terminated  or revoked sooner.       Influenza A by PCR NEGATIVE NEGATIVE Final   Influenza B by PCR NEGATIVE NEGATIVE Final    Comment: (NOTE) The Xpert Xpress SARS-CoV-2/FLU/RSV plus assay is  intended as an aid in the diagnosis of influenza from Nasopharyngeal swab specimens and should not be used as a sole basis for treatment. Nasal washings and aspirates are unacceptable for Xpert Xpress SARS-CoV-2/FLU/RSV testing.  Fact Sheet for Patients: EntrepreneurPulse.com.au  Fact Sheet for Healthcare Providers: IncredibleEmployment.be  This test is not yet approved or cleared by the [place] FDA and has been authorized for detection and/or diagnosis of SARS-CoV-2 by FDA under an Emergency Use Authorization (EUA). This EUA will remain in effect (meaning this test can be used) for the duration of the COVID-19 declaration under Section 564(b)(1) of the Act, 21 U.S.C. section 360bbb-3(b)(1), unless the authorization is terminated or revoked.  Performed at [facility], [place] [address], [place], [place] [address]      Radiology Studies: ECHOCARDIOGRAM COMPLETE  Result Date: 07/27/2022    ECHOCARDIOGRAM REPORT   Patient Name:   ILHAM ROUGHTON Date of Exam: 07/27/2022 Medical Rec #:  [id number]    Height:       62.0 in Accession #:    [id number]   Weight:       222.3 lb Date of Birth:  [birth year]   BSA:          2.000 m Patient Age:    60 years     BP:           113/53 mmHg Patient Gender: F            HR:           77 bpm. Exam Location:  Inpatient Procedure: 2D Echo, 3D Echo, Strain Analysis, Intracardiac Opacification Agent,            Cardiac Doppler and Color Doppler Indications:    CHF-Acute Systolic C94.49  History:        Patient has prior history of Echocardiogram examinations, most                 recent 06/16/2021. CHF, ICD, Arrythmias:Atrial Flutter,                 Signs/Symptoms:Shortness of Breath; Risk Factors:Hypertension,                 Diabetes, Dyslipidemia, Current Smoker and Sleep Apnea.  Sonographer:    Greer Pickerel Referring Phys: [phone] AMY D CLEGG  Sonographer Comments: Image acquisition challenging due to COPD,  Image  acquisition challenging due to respiratory motion and Image acquisition challenging due to patient body habitus. Global longitudinal strain was attempted. IMPRESSIONS  1. No thrombus seen with Definity contrast. Left ventricular ejection fraction, by estimation, is 25 to 30%. Left ventricular ejection fraction by 3D volume is 26 %. The left ventricle has severely decreased function. The left ventricle demonstrates global hypokinesis. Left ventricular diastolic function could not be evaluated. There is the interventricular septum is flattened in diastole ('D' shaped left ventricle), consistent with right ventricular volume overload. The average left ventricular global longitudinal strain is -6.8 %. The global longitudinal strain is abnormal.  2. Right ventricular systolic function is low normal. The right ventricular size is mildly enlarged. There is moderately elevated pulmonary artery systolic pressure.  3. Left atrial size was severely dilated.  4. Right atrial size was severely dilated.  5. The mitral valve is normal in structure. Mild to moderate mitral valve regurgitation. Moderate mitral annular calcification.  6. Tricuspid valve regurgitation is severe.  7. The aortic valve is tricuspid. There is mild calcification of the aortic valve. Aortic valve regurgitation is trivial. Aortic valve sclerosis/calcification is present, without any evidence of aortic stenosis.  8. The inferior vena cava is dilated in size with <50% respiratory variability, suggesting right atrial pressure of 15 mmHg. Comparison(s): No significant change from prior study. Prior images reviewed side by side. TR was severe on the previous TEE as well (triangular jet with "v-wave cutoff" was seen on that study). FINDINGS  Left Ventricle: No thrombus seen with Definity contrast. Left ventricular ejection fraction, by estimation, is 25 to 30%. Left ventricular ejection fraction by 3D volume is 26 %. The left ventricle has severely decreased  function. The left ventricle demonstrates global hypokinesis. Definity contrast agent was given IV to delineate the left ventricular endocardial borders. The average left ventricular global longitudinal strain is -6.8 %. The global longitudinal strain is abnormal. The left ventricular internal cavity size was normal in size. There is no left ventricular hypertrophy. The interventricular septum is flattened in diastole ('D' shaped left ventricle), consistent with right ventricular volume overload. Left ventricular diastolic  function could not be evaluated due to atrial fibrillation. Left ventricular diastolic function could not be evaluated. Right Ventricle: The right ventricular size is mildly enlarged. No increase in right ventricular wall thickness. Right ventricular systolic function is low normal. There is moderately elevated pulmonary artery systolic pressure. The tricuspid regurgitant  velocity is 2.87 m/s, and with an assumed right atrial pressure of 15 mmHg, the estimated right ventricular systolic pressure is 58.5 mmHg. Left Atrium: Left atrial size was severely dilated. Right Atrium: Right atrial size was severely dilated. Pericardium: There is no evidence of pericardial effusion. Mitral Valve: The mitral valve is normal in structure. Moderate mitral annular calcification. Mild to moderate mitral valve regurgitation, with centrally-directed jet. MV peak gradient, 9.2 mmHg. The mean mitral valve gradient is 4.0 mmHg. Tricuspid Valve: The tricuspid valve is normal in structure. Tricuspid valve regurgitation is severe. Aortic Valve: The aortic valve is tricuspid. There is mild calcification of the aortic valve. Aortic valve regurgitation is trivial. Aortic valve sclerosis/calcification is present, without any evidence of aortic stenosis. Pulmonic Valve: The pulmonic valve was normal in structure. Pulmonic valve regurgitation is not visualized. Aorta: The aortic root and ascending aorta are structurally  normal, with no evidence of dilitation. Venous: The inferior vena cava is dilated in size with less than 50% respiratory variability, suggesting right atrial pressure of 15 mmHg. The  inferior vena cava and the hepatic vein show a pattern of systolic flow reversal, suggestive of tricuspid regurgitation. IAS/Shunts: No atrial level shunt detected by color flow Doppler. Additional Comments: A device lead is visualized in the right ventricle.  LEFT VENTRICLE PLAX 2D LVIDd:         5.50 cm         Diastology LVIDs:         4.50 cm         LV e' medial:    10.10 cm/s LV PW:         1.20 cm         LV E/e' medial:  14.2 LV IVS:        0.80 cm         LV e' lateral:   15.20 cm/s LVOT diam:     1.90 cm         LV E/e' lateral: 9.4 LV SV:         33 LV SV Index:   16              2D LVOT Area:     2.84 cm        Longitudinal                                Strain                                2D Strain GLS  -6.8 %                                Avg:                                 3D Volume EF                                LV 3D EF:    Left                                             ventricul                                             ar                                             ejection                                             fraction  by 3D                                             volume is                                             26 %.                                 3D Volume EF:                                3D EF:        26 %                                LV EDV:       138 ml                                LV ESV:       102 ml                                LV SV:        36 ml RIGHT VENTRICLE RV S prime:     11.80 cm/s TAPSE (M-mode): 1.7 cm LEFT ATRIUM              Index        RIGHT ATRIUM           Index LA diam:        4.90 cm  2.45 cm/m   RA Area:     23.00 cm LA Vol (A2C):   110.0 ml 55.01 ml/m  RA Volume:   66.80 ml  33.41 ml/m LA Vol (A4C):   64.5  ml  32.26 ml/m LA Biplane Vol: 85.5 ml  42.76 ml/m  AORTIC VALVE             PULMONIC VALVE LVOT Vmax:   79.00 cm/s  PR End Diast Vel: 3.82 msec LVOT Vmean:  54.100 cm/s LVOT VTI:    0.115 m  AORTA Ao Root diam: 3.10 cm Ao Asc diam:  3.60 cm MITRAL VALVE                TRICUSPID VALVE MV Area (PHT): 3.91 cm     TR Peak grad:   32.9 mmHg MV Area VTI:   1.28 cm     TR Vmax:        287.00 cm/s MV Peak grad:  9.2 mmHg MV Mean grad:  4.0 mmHg     SHUNTS MV Vmax:       1.52 m/s     Systemic VTI:  0.12 m MV Vmean:      85.6 cm/s    Systemic Diam: 1.90 cm MV Decel Time: 194 msec MR Peak grad: 91.4 mmHg MR Mean grad: 56.0 mmHg MR Vmax:      478.00 cm/s MR Vmean:  349.0 cm/s MV E velocity: 143.00 cm/s MV A velocity: 50.90 cm/s MV E/A ratio:  2.81 Mihai Croitoru MD Electronically signed by Sanda Klein MD Signature Date/Time: 07/27/2022/4:34:05 PM    Final      Scheduled Meds:  aspirin EC  81 mg Oral Daily   atorvastatin  80 mg Oral Daily   Chlorhexidine Gluconate Cloth  6 each Topical Daily   insulin aspart  0-15 Units Subcutaneous TID WC   insulin aspart  0-5 Units Subcutaneous QHS   insulin aspart  2 Units Subcutaneous TID WC   insulin glargine-yfgn  10 Units Subcutaneous Daily   metolazone  2.5 mg Oral Once   nicotine  21 mg Transdermal Daily   potassium chloride  40 mEq Oral Daily   sodium chloride flush  10-40 mL Intracatheter Q12H   sodium chloride flush  3 mL Intravenous Q12H   umeclidinium-vilanterol  1 puff Inhalation Daily   Continuous Infusions:  sodium chloride     sodium chloride Stopped (07/26/22 0932)   amiodarone 60 mg/hr (07/29/22 0941)   DOBUTamine 2.5 mcg/kg/min (07/27/22 1554)   furosemide (LASIX) 200 mg in dextrose 5 % 100 mL (2 mg/mL) infusion 20 mg/hr (07/29/22 0935)   heparin 1,450 Units/hr (07/29/22 1010)     LOS: 4 days    Time spent: 45mn    PDomenic Polite MD [facility]   07/29/2022, 11:42 AM

## 2022-07-29 NOTE — Progress Notes (Signed)
Attempt x1 to give morning breathing treatments. Will check back with pt again.

## 2022-07-29 NOTE — Inpatient Diabetes Management (Signed)
Inpatient Diabetes Program Recommendations  AACE/ADA: New Consensus Statement on Inpatient Glycemic Control (2015)  Target Ranges:  Prepandial:   less than 140 mg/dL      Peak postprandial:   less than 180 mg/dL (1-2 hours)      Critically ill patients:  140 - 180 mg/dL   Lab Results  Component Value Date   GLUCAP 75 07/29/2022   HGBA1C 9.7 (H) 06/17/2022    Review of Glycemic Control  Latest Reference Range & Units 07/27/22 08:37 07/27/22 12:23 07/27/22 16:39 07/27/22 21:01 07/28/22 07:47 07/28/22 12:18 07/28/22 16:37 07/28/22 22:02 07/29/22 07:55  Glucose-Capillary 70 - 99 mg/dL 190 (H) 214 (H) 190 (H) 119 (H) 101 (H) 108 (H) 79 88 75   Diabetes history: DM 2 Outpatient Diabetes medications: Farxiga 10 mg Daily Current orders for Inpatient glycemic control:  Semglee 10 units Novolog 0-15 units + hs Novolog 2 units tid meal coverage  Inpatient Diabetes Program Recommendations:    Trends much lower today fasting glucose in the 70's. Note: Semglee already given this am. Anticipate possible hypoglycemia in the am.  -  Consider discontinuing Semglee -  Consider discontinuing Novolog 2 units meal coverage  Thanks, Tama Headings RN, MSN, BC-ADM Inpatient Diabetes Coordinator Team Pager [phone] (8a-5p)

## 2022-07-29 NOTE — Progress Notes (Signed)
ANTICOAGULATION CONSULT NOTE  Pharmacy Consult for heparin Indication: atrial fibrillation  Allergies  Allergen Reactions   Actos [Pioglitazone] Other (See Comments)    congestive heart failure    Naproxen Other (See Comments)    529m dose made her sleep for two days   Rosiglitazone Hives   Tomato Itching   Hydrocodone-Acetaminophen Itching   Hydrocortisone Hives and Itching    Patient Measurements: Weight: 98.4 kg (216 lb 14.4 oz) Heparin Dosing Weight: 100kg  Vital Signs: Temp: 97.5 F (36.4 C) (10/27 0757) Temp Source: Oral (10/27 0757) BP: 121/65 (10/27 0757) Pulse Rate: 70 (10/27 0757)  Labs: Recent Labs    07/27/22 0500 07/27/22 1445 07/28/22 0527 07/28/22 0743 07/28/22 1859 07/29/22 0500  HGB 10.3*  --  9.7*  --   --  10.3*  HCT 33.0*  --  30.8*  --   --  32.5*  PLT 207  --  202  --   --  221  HEPARINUNFRC 0.96*   < >  --  0.18* 0.41 0.27*  CREATININE 1.97*  --  2.09*  --   --  2.34*   < > = values in this interval not displayed.     Estimated Creatinine Clearance: 25.2 mL/min (A) (by C-G formula based on SCr of 2.34 mg/dL (H)).   Medical History: Past Medical History:  Diagnosis Date   ATN (acute tubular necrosis) ([place] 07/15/2014   Automatic implantable cardioverter-defibrillator in situ    Automatic implantable cardioverter-defibrillator in situ 05/04/2007   Qualifier: Diagnosis of  By: CIsla Pence    Cardiac defibrillator in situ    Atlas II VR (SJM) implanted by Dr TLovena Le  CHF (congestive heart failure) ([place]    Chronic combined systolic and diastolic heart failure ([place]    a. EF 35-40% in past;  b. Echo 7/13:  EF 45-50%, Gr 2 diast dysfn, mild AI, mild MAC, trivial MR, mild LAE, PASP 47.   Chronic ulcer of leg ([place]    04-09-15 resolved-not a problem.   Colon polyps 04/12/2013   Rectosigmoid polyp   COPD (chronic obstructive pulmonary disease) (HCC)    O2 at night   Depression    Diabetes mellitus    Diabetes mellitus, type 2  ([place] 04/03/2012   HIGH RISK FEET.. Please have patient take shoes and socks off every visit for visual foot inspection.     Eczema    Elevated alkaline phosphatase level    GGT and 5'nucleotidase 8/13 normal   Health care maintenance 01/22/2013   Surgically absent cervix- no pap needed (Path report 07/2000: uterine body with attached bilateral  adnexa and separate cervix.)   History of oxygen administration    oxygen @ 2 l/m nasally bedtime 24/7   HTN (hypertension)    Hx of cardiac cath    a. LRome2003 normal;  b. LHC 6/13:  Mild calcification in the LM, o/w normal coronary arteries, EF 45%.    Hyperkalemia 08/08/2017   Hyperlipidemia    Elevated triglyceride in 2019   Hyperlipidemia associated with type 2 diabetes mellitus ([place] 05/25/2007   Qualifier: Diagnosis of  By: MHassell DoneFNP, [place]     Hyperthyroidism, subclinical    HYPERTHYROIDISM, SUBCLINICAL 05/06/2009   Qualifier: Diagnosis of  By: MDarrick Meigs    Implantable cardioverter-defibrillator (ICD) generator end of life    Lipoma    Need for hepatitis C screening test 04/25/2019   NICM (nonischemic cardiomyopathy) ([place]    Obesity    On  home oxygen therapy    "2L; 24/7" (10/11/2016)   Post menopausal syndrome    Shortness of breath 07/14/2017   Skin rash 07/12/2018   Sleep apnea    pt denies 04/12/2013    Assessment: 85 yoF admitted with CHF and new Aflutter . No AC PTA, pharmacy to start IV heparin.CBC wnl on admit.  Heparin level subtherapeutic at 0.27. CBC stable.   Goal of Therapy:  Heparin level 0.3-0.7 units/ml Monitor platelets by anticoagulation protocol: Yes   Plan:  Increase heparin to 1450 units/h Daily heparin level and CBC   Arrie Senate, PharmD, [place], [facility] Clinical Pharmacist [phone] Please check AMION for all [facility] numbers 07/29/2022

## 2022-07-30 DIAGNOSIS — I5023 Acute on chronic systolic (congestive) heart failure: Secondary | ICD-10-CM | POA: Diagnosis not present

## 2022-07-30 LAB — BASIC METABOLIC PANEL
Anion gap: 15 (ref 5–15)
BUN: 42 mg/dL — ABNORMAL HIGH (ref 8–23)
CO2: 32 mmol/L (ref 22–32)
Calcium: 9.2 mg/dL (ref 8.9–10.3)
Chloride: 90 mmol/L — ABNORMAL LOW (ref 98–111)
Creatinine, Ser: 2.51 mg/dL — ABNORMAL HIGH (ref 0.44–1.00)
GFR, Estimated: 20 mL/min — ABNORMAL LOW (ref >60)
Glucose, Bld: 111 mg/dL — ABNORMAL HIGH (ref 70–99)
Potassium: 4.7 mmol/L (ref 3.5–5.1)
Sodium: 137 mmol/L (ref 135–145)

## 2022-07-30 LAB — CBC
HCT: 30.5 % — ABNORMAL LOW (ref 36.0–46.0)
Hemoglobin: 9.9 g/dL — ABNORMAL LOW (ref 12.0–15.0)
MCH: 26.3 pg (ref 26.0–34.0)
MCHC: 32.5 g/dL (ref 30.0–36.0)
MCV: 80.9 fL (ref 80.0–100.0)
Platelets: 206 10*3/uL (ref 150–400)
RBC: 3.77 MIL/uL — ABNORMAL LOW (ref 3.87–5.11)
RDW: 19.1 % — ABNORMAL HIGH (ref 11.5–15.5)
WBC: 7.6 10*3/uL (ref 4.0–10.5)
nRBC: 0 % (ref 0.0–0.2)

## 2022-07-30 LAB — GLUCOSE, CAPILLARY
Glucose-Capillary: 88 mg/dL (ref 70–99)
Glucose-Capillary: 119 mg/dL — ABNORMAL HIGH (ref 70–99)
Glucose-Capillary: 143 mg/dL — ABNORMAL HIGH (ref 70–99)
Glucose-Capillary: 153 mg/dL — ABNORMAL HIGH (ref 70–99)

## 2022-07-30 LAB — COOXEMETRY PANEL
Carboxyhemoglobin: 2.1 % — ABNORMAL HIGH (ref 0.5–1.5)
Methemoglobin: 0.9 % (ref 0.0–1.5)
O2 Saturation: 86.9 %
Total hemoglobin: 10.4 g/dL — ABNORMAL LOW (ref 12.0–16.0)

## 2022-07-30 MED ORDER — INSULIN GLARGINE-YFGN 100 UNIT/ML ~~LOC~~ SOLN
8.0000 [IU] | Freq: Every day | SUBCUTANEOUS | Status: DC
  Administered 2022-07-31: 8 [IU] via SUBCUTANEOUS
  Filled 2022-07-30: qty 0.08

## 2022-07-30 NOTE — Progress Notes (Signed)
PROGRESS NOTE    [name]  JJH:[id number] DOB: 1951-03-10 DOA: 07/25/2022 PCP: [name], MD  70/F with history of chronic systolic CHF, EF 81-85%, COPD, chronic respiratory failure on 2 L home O2, tobacco use, type 2 diabetes mellitus, hypertension presented to the ED with increased shortness of breath. -Noted to be in atrial flutter with volume overload, then complicated by cardiogenic shock -Started amiodarone, converted to sinus -Advanced heart failure team following, started on dobutamine 10/24 and diuretics -10/25 started on Lasix gtt. -10/26 onwards started responding to diuretics, creatinine trending up slowly  Subjective: -Continues to feel better, breathing is improving, no events overnight, walked a bit and sat up in the chair yesterday  Assessment and Plan:  Acute on chronic systolic CHF  Cardiogenic shock, low output state Echo EF 25 to 30%, septal dyskinesis, RV with mild reduction in systolic function, moderate dilatation of RA, moderate TR.  -Advanced heart failure team following, started on dobutamine 10/24 with diuretics -Lasix GTT increased to 20 Mg per hour , improved diuresis last 48 hours, 7.1 L negative -Creatinine continues to trend up, 2.5 today, may need right heart cath -Per heart failure team, GDMT limited by hypotension and AKI -Poor dietary compliance noted as well, eats fast food several times a week and canned food too -Dietician insulted -Increase activity, PT following  Atrial flutter with rapid ventricular response (HCC) -on IV amiodarone, in sinus rhythm now, apixaban resumed  Acute kidney injury superimposed on chronic kidney disease ([place]) CKD stage 3a -Worsened in the setting of cardiogenic shock -Continue inotropes , avoid hypotension -Unfortunately creatinine continues to trend up slowly  Acute metabolic encephalopathy -Likely from cardiogenic shock, hypoperfusion -Improving  COPD (chronic obstructive pulmonary disease)  (HCC) -Off systemic steroids, continue Xopenex, Anoro Ellipta  Type 2 diabetes mellitus with hyperlipidemia (HCC) -CBGs stable, decrease glargine dose and discontinue meal coverage with worsening AKI  Obesity (BMI 30-39.9) Calculate BMI    DVT prophylaxis: Apixaban Code Status: Full code Family Communication: None present Disposition Plan: Home likely 2 to 3 days  Consultants: CHF team   Procedures:   Antimicrobials:    Objective: Vitals:   07/29/22 2126 07/30/22 0130 07/30/22 0645 07/30/22 0712  BP: (!) 134/56 (!) 137/59 132/85 (!) 123/57  Pulse: 72 72 100 69  Resp: '20 20 18 18  '$ Temp: (!) 97.4 F (36.3 C) 97.9 F (36.6 C) 97.7 F (36.5 C) 97.8 F (36.6 C)  TempSrc: Oral Oral Oral Oral  SpO2: 94% 93% 99% 99%  Weight:        Intake/Output Summary (Last 24 hours) at 07/30/2022 1057 Last data filed at 07/30/2022 1029 Gross per 24 hour  Intake 1564.48 ml  Output 5530 ml  Net -3965.52 ml   Filed Weights   07/27/22 0545 07/28/22 0537 07/29/22 0425  Weight: 100.8 kg 100.2 kg 98.4 kg    Examination:  General exam: Obese chronically ill female sitting up in bed, AAOx3, no distress HEENT: Positive JVD CVS: S1-S2, regular rhythm Lungs: Decreased breath sounds to bases Abdomen: Soft, obese, nontender, abdominal wall edema Extremities: 1-2+ edema Skin: Taut skin Psychiatry:  Mood & affect appropriate.     Data Reviewed:   CBC: Recent Labs  Lab 07/25/22 1320 07/25/22 1325 07/26/22 0314 07/26/22 1046 07/27/22 0500 07/28/22 0527 07/29/22 0500 07/30/22 0610  WBC 8.5  --  7.8  --  12.6* 9.1 7.5 7.6  NEUTROABS 6.3  --   --   --   --   --   --   --  HGB 12.5   < > 10.9* 12.9 10.3* 9.7* 10.3* 9.9*  HCT 41.6   < > 35.1* 38.0 33.0* 30.8* 32.5* 30.5*  MCV 85.6  --  84.8  --  84.4 83.7 81.7 80.9  PLT 275  --  199  --  207 202 221 206   < > = values in this interval not displayed.   Basic Metabolic Panel: Recent Labs  Lab 07/25/22 1318 07/25/22 1320  07/26/22 0314 07/26/22 1046 07/27/22 0500 07/28/22 0527 07/29/22 0500 07/30/22 0610  NA  --    < > 136 135 137 136 138 137  K  --    < > 5.0 4.5 4.2 4.7 4.8 4.7  CL  --    < > 98  --  97* 96* 95* 90*  CO2  --    < > 28  --  '28 30 31 '$ 32  GLUCOSE  --    < > 413*  --  197* 98 62* 111*  BUN  --    < > 35*  --  43* 44* 41* 42*  CREATININE  --    < > 1.95*  --  1.97* 2.09* 2.34* 2.51*  CALCIUM  --    < > 8.3*  --  8.3* 8.5* 9.3 9.2  MG 2.1  --   --   --   --  2.0  --   --    < > = values in this interval not displayed.   GFR: Estimated Creatinine Clearance: 23.5 mL/min (A) (by C-G formula based on SCr of 2.51 mg/dL (H)). Liver Function Tests: Recent Labs  Lab 07/25/22 1320  AST 45*  ALT 36  ALKPHOS 266*  BILITOT 1.6*  PROT 7.4  ALBUMIN 2.8*   No results for input(s): "LIPASE", "AMYLASE" in the last 168 hours. No results for input(s): "AMMONIA" in the last 168 hours. Coagulation Profile: No results for input(s): "INR", "PROTIME" in the last 168 hours. Cardiac Enzymes: No results for input(s): "CKTOTAL", "CKMB", "CKMBINDEX", "TROPONINI" in the last 168 hours. BNP (last 3 results) No results for input(s): "PROBNP" in the last 8760 hours. HbA1C: No results for input(s): "HGBA1C" in the last 72 hours. CBG: Recent Labs  Lab 07/29/22 0755 07/29/22 1136 07/29/22 1648 07/29/22 2122 07/30/22 0757  GLUCAP 75 169* 77 88 88   Lipid Profile: No results for input(s): "CHOL", "HDL", "LDLCALC", "TRIG", "CHOLHDL", "LDLDIRECT" in the last 72 hours. Thyroid Function Tests: No results for input(s): "TSH", "T4TOTAL", "FREET4", "T3FREE", "THYROIDAB" in the last 72 hours. Anemia Panel: No results for input(s): "VITAMINB12", "FOLATE", "FERRITIN", "TIBC", "IRON", "RETICCTPCT" in the last 72 hours. Urine analysis:    Component Value Date/Time   COLORURINE YELLOW 06/26/2022 [place] 06/26/2022 1725   LABSPEC 1.020 06/26/2022 1725   PHURINE 5.5 06/26/2022 1725   GLUCOSEU  100 (A) 06/26/2022 1725   HGBUR NEGATIVE 06/26/2022 1725   HGBUR negative 11/08/2010 0854   BILIRUBINUR NEGATIVE 06/26/2022 [place] 06/26/2022 1725   PROTEINUR NEGATIVE 06/26/2022 1725   UROBILINOGEN 1.0 01/26/2015 2028   NITRITE NEGATIVE 06/26/2022 1725   LEUKOCYTESUR NEGATIVE 06/26/2022 1725   Sepsis Labs: '@LABRCNTIP'$ (procalcitonin:4,lacticidven:4)  ) Recent Results (from the past 240 hour(s))  Resp Panel by RT-PCR (Flu A&B, Covid) Anterior Nasal Swab     Status: None   Collection Time: 07/25/22  1:21 PM   Specimen: Anterior Nasal Swab  Result Value Ref Range Status   SARS Coronavirus 2 by RT PCR NEGATIVE  NEGATIVE Final    Comment: (NOTE) SARS-CoV-2 target nucleic acids are NOT DETECTED.  The SARS-CoV-2 RNA is generally detectable in upper respiratory specimens during the acute phase of infection. The lowest concentration of SARS-CoV-2 viral copies this assay can detect is 138 copies/mL. A negative result does not preclude SARS-Cov-2 infection and should not be used as the sole basis for treatment or other patient management decisions. A negative result may occur with  improper specimen collection/handling, submission of specimen other than nasopharyngeal swab, presence of viral mutation(s) within the areas targeted by this assay, and inadequate number of viral copies(<138 copies/mL). A negative result must be combined with clinical observations, patient history, and epidemiological information. The expected result is Negative.  Fact Sheet for Patients:  EntrepreneurPulse.com.au  Fact Sheet for Healthcare Providers:  IncredibleEmployment.be  This test is no t yet approved or cleared by the [place] FDA and  has been authorized for detection and/or diagnosis of SARS-CoV-2 by FDA under an Emergency Use Authorization (EUA). This EUA will remain  in effect (meaning this test can be used) for the duration of  the COVID-19 declaration under Section 564(b)(1) of the Act, 21 U.S.C.section 360bbb-3(b)(1), unless the authorization is terminated  or revoked sooner.       Influenza A by PCR NEGATIVE NEGATIVE Final   Influenza B by PCR NEGATIVE NEGATIVE Final    Comment: (NOTE) The Xpert Xpress SARS-CoV-2/FLU/RSV plus assay is intended as an aid in the diagnosis of influenza from Nasopharyngeal swab specimens and should not be used as a sole basis for treatment. Nasal washings and aspirates are unacceptable for Xpert Xpress SARS-CoV-2/FLU/RSV testing.  Fact Sheet for Patients: EntrepreneurPulse.com.au  Fact Sheet for Healthcare Providers: IncredibleEmployment.be  This test is not yet approved or cleared by the [place] FDA and has been authorized for detection and/or diagnosis of SARS-CoV-2 by FDA under an Emergency Use Authorization (EUA). This EUA will remain in effect (meaning this test can be used) for the duration of the COVID-19 declaration under Section 564(b)(1) of the Act, 21 U.S.C. section 360bbb-3(b)(1), unless the authorization is terminated or revoked.  Performed at [facility], [place] [address], [place], [place] [address]      Radiology Studies: No results found.   Scheduled Meds:  apixaban  5 mg Oral BID   aspirin EC  81 mg Oral Daily   atorvastatin  80 mg Oral Daily   Chlorhexidine Gluconate Cloth  6 each Topical Daily   insulin aspart  0-15 Units Subcutaneous TID WC   insulin aspart  0-5 Units Subcutaneous QHS   insulin aspart  2 Units Subcutaneous TID WC   insulin glargine-yfgn  10 Units Subcutaneous Daily   nicotine  21 mg Transdermal Daily   potassium chloride  40 mEq Oral Daily   sodium chloride flush  10-40 mL Intracatheter Q12H   sodium chloride flush  3 mL Intravenous Q12H   umeclidinium-vilanterol  1 puff Inhalation Daily   Continuous Infusions:  sodium chloride     sodium chloride Stopped (07/26/22 0932)    amiodarone 60 mg/hr (07/30/22 1029)   DOBUTamine 2.5 mcg/kg/min (07/30/22 1029)   furosemide (LASIX) 200 mg in dextrose 5 % 100 mL (2 mg/mL) infusion 20 mg/hr (07/30/22 0548)     LOS: 5 days    Time spent: 5mn    PDomenic Polite MD [facility]   07/30/2022, 10:57 AM

## 2022-07-30 NOTE — Progress Notes (Signed)
Advanced Heart Failure Rounding Note  PCP-Cardiologist: None   Subjective:    Admitted with A/C HFrEF and new onset A flutter. Started on amio + heparin. Lasix gtt + DBA.   Echo LVEF 25-30%, D shaped LV, RV mildly dilated w/ mod elevated RVSP, RV systolic fx mildly reduced, severe BAE, severe TR, mild-mod MR, IVC dilated, assumed RAP 15    Feels much better. Co-ox 87% on DBA 2.5    On lasix gtt 20/hr. UOP picking up > 5LL out yesterday. Wt not measured . CVP 16 -> 9  SCr trending up, 1.97>>2.09>>2.34. > 2.5 SBPs ok ~120s. Urine clear  K 4.8  Still in NSR, on amio gtt 60/hr + heparin gtt  No SOB, orthopnea or PND   Objective:   Weight Range: 98.4 kg Body mass index is 39.67 kg/m.   Vital Signs:   Temp:  [97.4 F (36.3 C)-97.9 F (36.6 C)] 97.8 F (36.6 C) (10/28 0712) Pulse Rate:  [67-100] 69 (10/28 0712) Resp:  [18-21] 18 (10/28 0712) BP: (123-140)/(56-85) 123/57 (10/28 0712) SpO2:  [93 %-99 %] 99 % (10/28 0712) Last BM Date : 07/29/22  Weight change: Filed Weights   07/27/22 0545 07/28/22 0537 07/29/22 0425  Weight: 100.8 kg 100.2 kg 98.4 kg    Intake/Output:   Intake/Output Summary (Last 24 hours) at 07/30/2022 1211 Last data filed at 07/30/2022 1029 Gross per 24 hour  Intake 1564.48 ml  Output 5530 ml  Net -3965.52 ml      Physical Exam   General:  Sitting in chair . No resp difficulty HEENT: normal Neck: supple. JVP 8-9. Carotids 2+ bilat; no bruits. No lymphadenopathy or thryomegaly appreciated. Cor: PMI nondisplaced. Regular rate & rhythm. No rubs, gallops or murmurs. Lungs: clear Abdomen: soft, nontender, nondistended. No hepatosplenomegaly. No bruits or masses. Good bowel sounds. Extremities: no cyanosis, clubbing, rash, 1+ edema Neuro: alert & orientedx3, cranial nerves grossly intact. moves all 4 extremities w/o difficulty. Affect pleasant   Telemetry   Sinus 60-70s Personally reviewed  Labs    CBC Recent Labs     07/29/22 0500 07/30/22 0610  WBC 7.5 7.6  HGB 10.3* 9.9*  HCT 32.5* 30.5*  MCV 81.7 80.9  PLT 221 025    Basic Metabolic Panel Recent Labs    07/28/22 0527 07/29/22 0500 07/30/22 0610  NA 136 138 137  K 4.7 4.8 4.7  CL 96* 95* 90*  CO2 30 31 32  GLUCOSE 98 62* 111*  BUN 44* 41* 42*  CREATININE 2.09* 2.34* 2.51*  CALCIUM 8.5* 9.3 9.2  MG 2.0  --   --     Liver Function Tests No results for input(s): "AST", "ALT", "ALKPHOS", "BILITOT", "PROT", "ALBUMIN" in the last 72 hours.  No results for input(s): "LIPASE", "AMYLASE" in the last 72 hours. Cardiac Enzymes No results for input(s): "CKTOTAL", "CKMB", "CKMBINDEX", "TROPONINI" in the last 72 hours.  BNP: BNP (last 3 results) Recent Labs    06/26/22 1445 07/13/22 1609 07/25/22 1321  BNP 418.1* 358.7* 461.6*     ProBNP (last 3 results) No results for input(s): "PROBNP" in the last 8760 hours.   D-Dimer No results for input(s): "DDIMER" in the last 72 hours. Hemoglobin A1C No results for input(s): "HGBA1C" in the last 72 hours. Fasting Lipid Panel No results for input(s): "CHOL", "HDL", "LDLCALC", "TRIG", "CHOLHDL", "LDLDIRECT" in the last 72 hours. Thyroid Function Tests No results for input(s): "TSH", "T4TOTAL", "T3FREE", "THYROIDAB" in the last 72 hours.  Invalid input(s): "  FREET3"  Other results:   Imaging    No results found.   Medications:     Scheduled Medications:  apixaban  5 mg Oral BID   aspirin EC  81 mg Oral Daily   atorvastatin  80 mg Oral Daily   Chlorhexidine Gluconate Cloth  6 each Topical Daily   insulin aspart  0-15 Units Subcutaneous TID WC   insulin aspart  0-5 Units Subcutaneous QHS   [START ON 07/31/2022] insulin glargine-yfgn  8 Units Subcutaneous Daily   nicotine  21 mg Transdermal Daily   potassium chloride  40 mEq Oral Daily   sodium chloride flush  10-40 mL Intracatheter Q12H   sodium chloride flush  3 mL Intravenous Q12H   umeclidinium-vilanterol  1 puff  Inhalation Daily    Infusions:  sodium chloride     sodium chloride Stopped (07/26/22 0932)   amiodarone 60 mg/hr (07/30/22 1029)   DOBUTamine 2.5 mcg/kg/min (07/30/22 1029)   furosemide (LASIX) 200 mg in dextrose 5 % 100 mL (2 mg/mL) infusion 20 mg/hr (07/30/22 0548)    PRN Medications: sodium chloride, levalbuterol, ondansetron (ZOFRAN) IV, sodium chloride flush, sodium chloride flush    Patient Profile    [name] is a 71 year old with a history of DMII, COPD, HTN, chronic respiratory failure, tobacco abuse, [employer] ICD, and chronic HFrEF. EF has 25-30% for at least 5 years. Last LHC 2017, normal coronaries.    Presented with increased shortness of breath. New onset A flutter and A/C HFrEF with marked volume overload.    Assessment/Plan   1. Acute on Chronic Hypoxic Respiratory Failure --> Multifactorial AECOPD and A/C HFrEF.  -Started on solumedrol. + inhalers. -Chronically on 2-3 liters [place]. Placed on Bipap on arrival.  - Now on nasal cannula.  - SARS2 negative - CTA - negative PE.  - Improving with diuresis   2. A/C HFrEF-->Cardiogenic Shock - NICM LHC 2017 normal cors. - 2022 Echo EF 25-30% which has been down for many years.  - Echo this admit LVEF 25-30%, D shaped LV, RV mildly dilated w/ mod elevated RVSP, RV systolic fx mildly reduced, severe BAE, severe TR, mild-mod MR, IVC dilated, assumed RAP 15  - Continue DBA 2.5 mcg. Co-ox 87%. Start DBA wean tomorrow after full diuresis - CVP 8-9. Decrease lasix to 10/hr - Hold off GDMT with AKI and decompensation.  - c/w unna boots.    3. New Onset A flutter RVR - Of note had TEE 06/2022- no thrombus noted.  -Continue eliquis  -Back in SR today. Continue IV amio today while on DBA.    4. AKI  -Creatinine baseline ~ 1.2  -On admit 1.7-->today 1.97 ->2.09->2.34 -> 2.5 - Decrease lasix to 10/hr  -Co-ox ok on DBA -Continue diuresis.    5. DMII, uncontrolled  - On steroids. - On SSI    6. Obesity     Length of  Stay: [place], [place]  07/30/2022, 12:11 PM  Advanced Heart Failure Team Pager [phone] (M-F; 7a - 5p)  Please contact [facility] for night-coverage after hours (5p -7a ) and weekends on [email or web address]

## 2022-07-31 ENCOUNTER — Inpatient Hospital Stay (HOSPITAL_COMMUNITY): Payer: Medicare Other

## 2022-07-31 DIAGNOSIS — I5023 Acute on chronic systolic (congestive) heart failure: Secondary | ICD-10-CM | POA: Diagnosis not present

## 2022-07-31 LAB — GLUCOSE, CAPILLARY
Glucose-Capillary: 88 mg/dL (ref 70–99)
Glucose-Capillary: 153 mg/dL — ABNORMAL HIGH (ref 70–99)
Glucose-Capillary: 243 mg/dL — ABNORMAL HIGH (ref 70–99)
Glucose-Capillary: 107 mg/dL — ABNORMAL HIGH (ref 70–99)

## 2022-07-31 LAB — BASIC METABOLIC PANEL
Anion gap: 13 (ref 5–15)
BUN: 40 mg/dL — ABNORMAL HIGH (ref 8–23)
CO2: 34 mmol/L — ABNORMAL HIGH (ref 22–32)
Calcium: 9.2 mg/dL (ref 8.9–10.3)
Chloride: 92 mmol/L — ABNORMAL LOW (ref 98–111)
Creatinine, Ser: 2.64 mg/dL — ABNORMAL HIGH (ref 0.44–1.00)
GFR, Estimated: 19 mL/min — ABNORMAL LOW (ref >60)
Glucose, Bld: 75 mg/dL (ref 70–99)
Potassium: 4.2 mmol/L (ref 3.5–5.1)
Sodium: 139 mmol/L (ref 135–145)

## 2022-07-31 LAB — CBC
HCT: 32.8 % — ABNORMAL LOW (ref 36.0–46.0)
Hemoglobin: 10.4 g/dL — ABNORMAL LOW (ref 12.0–15.0)
MCH: 25.9 pg — ABNORMAL LOW (ref 26.0–34.0)
MCHC: 31.7 g/dL (ref 30.0–36.0)
MCV: 81.6 fL (ref 80.0–100.0)
Platelets: 227 10*3/uL (ref 150–400)
RBC: 4.02 MIL/uL (ref 3.87–5.11)
RDW: 18.8 % — ABNORMAL HIGH (ref 11.5–15.5)
WBC: 7.5 10*3/uL (ref 4.0–10.5)
nRBC: 0 % (ref 0.0–0.2)

## 2022-07-31 LAB — COOXEMETRY PANEL
Carboxyhemoglobin: 2.8 % — ABNORMAL HIGH (ref 0.5–1.5)
Methemoglobin: <0.7 % (ref 0.0–1.5)
O2 Saturation: 85.9 %
Total hemoglobin: 10.9 g/dL — ABNORMAL LOW (ref 12.0–16.0)

## 2022-07-31 MED ORDER — DOBUTAMINE IN D5W 4-5 MG/ML-% IV SOLN
1.0000 ug/kg/min | INTRAVENOUS | Status: DC
  Administered 2022-07-31: 1 ug/kg/min via INTRAVENOUS
  Filled 2022-07-31: qty 250

## 2022-07-31 MED ORDER — INSULIN GLARGINE-YFGN 100 UNIT/ML ~~LOC~~ SOLN
5.0000 [IU] | Freq: Every day | SUBCUTANEOUS | Status: DC
  Administered 2022-08-01 – 2022-08-04 (×4): 5 [IU] via SUBCUTANEOUS
  Filled 2022-07-31 (×4): qty 0.05

## 2022-07-31 NOTE — Progress Notes (Signed)
Mobility Specialist - Progress Note   07/31/22 1503  Mobility  Activity Refused mobility   Pt refused mobility d/t guest just arriving. Will follow up if time permits.   Larey Seat

## 2022-07-31 NOTE — Progress Notes (Addendum)
PROGRESS NOTE    [name]  OVF:[id number] DOB: [birth year] DOA: 07/25/2022 PCP: [name], MD  70/F with history of chronic systolic CHF, EF 84-16%, COPD, chronic respiratory failure on 2 L home O2, tobacco use, type 2 diabetes mellitus, hypertension presented to the ED with increased shortness of breath. -Noted to be in atrial flutter with volume overload, then complicated by cardiogenic shock -Started amiodarone, converted to sinus -Advanced heart failure team following, started on dobutamine 10/24 and diuretics -10/25 started on Lasix gtt. -10/26 onwards started responding to diuretics, creatinine trending up slowly  Subjective: -Feels better overall, breathing improving, ambulating more, set up in the chair most of the day yesterday  Assessment and Plan:  Acute on chronic systolic CHF  Cardiogenic shock, low output state Echo EF 25 to 30%, septal dyskinesis, RV with mild reduction in systolic function, moderate dilatation of RA, moderate TR.  -Advanced heart failure team following, started on dobutamine 10/24 with diuretics -Diuresing better in the last 3 days, she is 9.3 L negative, CVP down, still with edema in upper thighs, Lasix drip rate decreased -Creatinine trended up to 2.6, appears to be plateauing now, may need right heart cath -Per heart failure team, GDMT limited by AKI -Poor dietary compliance noted as well, eats fast food several times a week and canned food too -Dietician insulted -Increase activity, PT following-> Home health PT recommended  Atrial flutter with rapid ventricular response (HCC) -on IV amiodarone, in sinus rhythm now, apixaban resumed -Remains on IV Amio while on dobutamine  Acute kidney injury superimposed on chronic kidney disease ([place]) CKD stage 3a -Baseline creatinine around 1.2-1.4 -Worsened in the setting of cardiogenic shock -Continue inotropes , avoid hypotension -Creatinine now 2.6, appears to be plateauing  Acute metabolic  encephalopathy -Likely from cardiogenic shock, hypoperfusion -Improving  COPD (chronic obstructive pulmonary disease) (HCC) -Off systemic steroids, continue Xopenex, Anoro Ellipta  Type 2 diabetes mellitus with hyperlipidemia (HCC) -CBGs stable, decrease glargine dose and discontinue meal coverage with worsening AKI  Obesity (BMI 30-39.9) Calculate BMI    DVT prophylaxis: Apixaban Code Status: Full code Family Communication: None present Disposition Plan: Home likely 2 to 2 days  Consultants: CHF team   Procedures:   Antimicrobials:    Objective: Vitals:   07/31/22 0004 07/31/22 0410 07/31/22 0424 07/31/22 0814  BP: (!) 140/67   131/65  Pulse: 70   79  Resp: 16   20  Temp: (!) 97.3 F (36.3 C) 97.8 F (36.6 C)  (!) 97.4 F (36.3 C)  TempSrc: Oral Oral  Oral  SpO2: 98%   96%  Weight:   90.3 kg     Intake/Output Summary (Last 24 hours) at 07/31/2022 1050 Last data filed at 07/31/2022 0815 Gross per 24 hour  Intake 1002.52 ml  Output 5200 ml  Net -4197.48 ml   Filed Weights   07/28/22 0537 07/29/22 0425 07/31/22 0424  Weight: 100.2 kg 98.4 kg 90.3 kg    Examination:  General exam: Obese chronically ill female sitting up in bed, AAOx3, no distress HEENT: Positive JVD CVS: S1-S2, regular rhythm Lungs: Decreased breath sounds to bases Abdomen: Soft, obese, nontender, abdominal wall edema Extremities: 1-2+ edema Skin: Taut skin Psychiatry:  Mood & affect appropriate.     Data Reviewed:   CBC: Recent Labs  Lab 07/25/22 1320 07/25/22 1325 07/27/22 0500 07/28/22 0527 07/29/22 0500 07/30/22 0610 07/31/22 0424  WBC 8.5   < > 12.6* 9.1 7.5 7.6 7.5  NEUTROABS 6.3  --   --   --   --   --   --  HGB 12.5   < > 10.3* 9.7* 10.3* 9.9* 10.4*  HCT 41.6   < > 33.0* 30.8* 32.5* 30.5* 32.8*  MCV 85.6   < > 84.4 83.7 81.7 80.9 81.6  PLT 275   < > 207 202 221 206 227   < > = values in this interval not displayed.   Basic Metabolic Panel: Recent Labs  Lab  07/25/22 1318 07/25/22 1320 07/27/22 0500 07/28/22 0527 07/29/22 0500 07/30/22 0610 07/31/22 0424  NA  --    < > 137 136 138 137 139  K  --    < > 4.2 4.7 4.8 4.7 4.2  CL  --    < > 97* 96* 95* 90* 92*  CO2  --    < > '28 30 31 '$ 32 34*  GLUCOSE  --    < > 197* 98 62* 111* 75  BUN  --    < > 43* 44* 41* 42* 40*  CREATININE  --    < > 1.97* 2.09* 2.34* 2.51* 2.64*  CALCIUM  --    < > 8.3* 8.5* 9.3 9.2 9.2  MG 2.1  --   --  2.0  --   --   --    < > = values in this interval not displayed.   GFR: Estimated Creatinine Clearance: 21.3 mL/min (A) (by C-G formula based on SCr of 2.64 mg/dL (H)). Liver Function Tests: Recent Labs  Lab 07/25/22 1320  AST 45*  ALT 36  ALKPHOS 266*  BILITOT 1.6*  PROT 7.4  ALBUMIN 2.8*   No results for input(s): "LIPASE", "AMYLASE" in the last 168 hours. No results for input(s): "AMMONIA" in the last 168 hours. Coagulation Profile: No results for input(s): "INR", "PROTIME" in the last 168 hours. Cardiac Enzymes: No results for input(s): "CKTOTAL", "CKMB", "CKMBINDEX", "TROPONINI" in the last 168 hours. BNP (last 3 results) No results for input(s): "PROBNP" in the last 8760 hours. HbA1C: No results for input(s): "HGBA1C" in the last 72 hours. CBG: Recent Labs  Lab 07/30/22 0757 07/30/22 1127 07/30/22 1717 07/30/22 2058 07/31/22 0812  GLUCAP 88 119* 143* 153* 88   Lipid Profile: No results for input(s): "CHOL", "HDL", "LDLCALC", "TRIG", "CHOLHDL", "LDLDIRECT" in the last 72 hours. Thyroid Function Tests: No results for input(s): "TSH", "T4TOTAL", "FREET4", "T3FREE", "THYROIDAB" in the last 72 hours. Anemia Panel: No results for input(s): "VITAMINB12", "FOLATE", "FERRITIN", "TIBC", "IRON", "RETICCTPCT" in the last 72 hours. Urine analysis:    Component Value Date/Time   COLORURINE YELLOW 06/26/2022 [place] 06/26/2022 1725   LABSPEC 1.020 06/26/2022 1725   PHURINE 5.5 06/26/2022 1725   GLUCOSEU 100 (A) 06/26/2022 1725    HGBUR NEGATIVE 06/26/2022 1725   HGBUR negative 11/08/2010 0854   BILIRUBINUR NEGATIVE 06/26/2022 1725   KETONESUR NEGATIVE 06/26/2022 1725   PROTEINUR NEGATIVE 06/26/2022 1725   UROBILINOGEN 1.0 01/26/2015 2028   NITRITE NEGATIVE 06/26/2022 1725   LEUKOCYTESUR NEGATIVE 06/26/2022 1725   Sepsis Labs: '@LABRCNTIP'$ (procalcitonin:4,lacticidven:4)  ) Recent Results (from the past 240 hour(s))  Resp Panel by RT-PCR (Flu A&B, Covid) Anterior Nasal Swab     Status: None   Collection Time: 07/25/22  1:21 PM   Specimen: Anterior Nasal Swab  Result Value Ref Range Status   SARS Coronavirus 2 by RT PCR NEGATIVE NEGATIVE Final    Comment: (NOTE) SARS-CoV-2 target nucleic acids are NOT DETECTED.  The SARS-CoV-2 RNA is generally detectable in upper respiratory specimens during the acute phase of infection.  The lowest concentration of SARS-CoV-2 viral copies this assay can detect is 138 copies/mL. A negative result does not preclude SARS-Cov-2 infection and should not be used as the sole basis for treatment or other patient management decisions. A negative result may occur with  improper specimen collection/handling, submission of specimen other than nasopharyngeal swab, presence of viral mutation(s) within the areas targeted by this assay, and inadequate number of viral copies(<138 copies/mL). A negative result must be combined with clinical observations, patient history, and epidemiological information. The expected result is Negative.  Fact Sheet for Patients:  EntrepreneurPulse.com.au  Fact Sheet for Healthcare Providers:  IncredibleEmployment.be  This test is no t yet approved or cleared by the [place] FDA and  has been authorized for detection and/or diagnosis of SARS-CoV-2 by FDA under an Emergency Use Authorization (EUA). This EUA will remain  in effect (meaning this test can be used) for the duration of the COVID-19 declaration under  Section 564(b)(1) of the Act, 21 U.S.C.section 360bbb-3(b)(1), unless the authorization is terminated  or revoked sooner.       Influenza A by PCR NEGATIVE NEGATIVE Final   Influenza B by PCR NEGATIVE NEGATIVE Final    Comment: (NOTE) The Xpert Xpress SARS-CoV-2/FLU/RSV plus assay is intended as an aid in the diagnosis of influenza from Nasopharyngeal swab specimens and should not be used as a sole basis for treatment. Nasal washings and aspirates are unacceptable for Xpert Xpress SARS-CoV-2/FLU/RSV testing.  Fact Sheet for Patients: EntrepreneurPulse.com.au  Fact Sheet for Healthcare Providers: IncredibleEmployment.be  This test is not yet approved or cleared by the [place] FDA and has been authorized for detection and/or diagnosis of SARS-CoV-2 by FDA under an Emergency Use Authorization (EUA). This EUA will remain in effect (meaning this test can be used) for the duration of the COVID-19 declaration under Section 564(b)(1) of the Act, 21 U.S.C. section 360bbb-3(b)(1), unless the authorization is terminated or revoked.  Performed at [facility], [place] [address], [place], [place] [address]      Radiology Studies: No results found.   Scheduled Meds:  apixaban  5 mg Oral BID   aspirin EC  81 mg Oral Daily   atorvastatin  80 mg Oral Daily   Chlorhexidine Gluconate Cloth  6 each Topical Daily   insulin aspart  0-15 Units Subcutaneous TID WC   insulin aspart  0-5 Units Subcutaneous QHS   insulin glargine-yfgn  8 Units Subcutaneous Daily   nicotine  21 mg Transdermal Daily   potassium chloride  40 mEq Oral Daily   sodium chloride flush  10-40 mL Intracatheter Q12H   sodium chloride flush  3 mL Intravenous Q12H   umeclidinium-vilanterol  1 puff Inhalation Daily   Continuous Infusions:  sodium chloride     sodium chloride Stopped (07/26/22 0932)   amiodarone 60 mg/hr (07/31/22 0600)   DOBUTamine 2.5 mcg/kg/min (07/31/22  0600)     LOS: 6 days    Time spent: 51mn    PDomenic Polite MD [facility]   07/31/2022, 10:50 AM

## 2022-07-31 NOTE — Progress Notes (Addendum)
Advanced Heart Failure Rounding Note  PCP-Cardiologist: None   Subjective:    Admitted with A/C HFrEF and new onset A flutter. Started on amio + heparin. Lasix gtt + DBA.   Echo LVEF 25-30%, D shaped LV, RV mildly dilated w/ mod elevated RVSP, RV systolic fx mildly reduced, severe BAE, severe TR, mild-mod MR, IVC dilated, assumed RAP 15    Lasix gtt stopped this am. Weight recorded as down 23 pounds total. CVP 10  Remains on DBA 2.5. Co-ox 86%  Maintaining NSR on IV amio   Scr 1.9 ->-> 2.5 -> 2.6  Feels good. Denies CP, SOB, orthopnea or PND. No bleeding on Eliquis.   Objective:   Weight Range: 90.3 kg Body mass index is 36.4 kg/m.   Vital Signs:   Temp:  [97.3 F (36.3 C)-97.8 F (36.6 C)] 97.4 F (36.3 C) (10/29 0814) Pulse Rate:  [68-79] 79 (10/29 0814) Resp:  [16-20] 20 (10/29 0814) BP: (131-142)/(65-75) 131/65 (10/29 0814) SpO2:  [95 %-98 %] 96 % (10/29 0814) Weight:  [90.3 kg] 90.3 kg (10/29 0424) Last BM Date : 07/29/22  Weight change: Filed Weights   07/28/22 0537 07/29/22 0425 07/31/22 0424  Weight: 100.2 kg 98.4 kg 90.3 kg    Intake/Output:   Intake/Output Summary (Last 24 hours) at 07/31/2022 1123 Last data filed at 07/31/2022 0815 Gross per 24 hour  Intake 1002.52 ml  Output 5200 ml  Net -4197.48 ml      Physical Exam   General:  Sitting in chair . No resp difficulty HEENT: normal Neck: supple. JVP 10 Carotids 2+ bilat; no bruits. No lymphadenopathy or thryomegaly appreciated. Cor: PMI nondisplaced. Regular rate & rhythm. No rubs, gallops or murmurs. Lungs: clear Abdomen: soft, nontender, nondistended. No hepatosplenomegaly. No bruits or masses. Good bowel sounds. Extremities: no cyanosis, clubbing, rash,tr  edema Neuro: alert & orientedx3, cranial nerves grossly intact. moves all 4 extremities w/o difficulty. Affect pleasant    Telemetry   Sinus 60-70s Personally reviewed  Labs    CBC Recent Labs    07/30/22 0610  07/31/22 0424  WBC 7.6 7.5  HGB 9.9* 10.4*  HCT 30.5* 32.8*  MCV 80.9 81.6  PLT 206 659    Basic Metabolic Panel Recent Labs    07/30/22 0610 07/31/22 0424  NA 137 139  K 4.7 4.2  CL 90* 92*  CO2 32 34*  GLUCOSE 111* 75  BUN 42* 40*  CREATININE 2.51* 2.64*  CALCIUM 9.2 9.2    Liver Function Tests No results for input(s): "AST", "ALT", "ALKPHOS", "BILITOT", "PROT", "ALBUMIN" in the last 72 hours.  No results for input(s): "LIPASE", "AMYLASE" in the last 72 hours. Cardiac Enzymes No results for input(s): "CKTOTAL", "CKMB", "CKMBINDEX", "TROPONINI" in the last 72 hours.  BNP: BNP (last 3 results) Recent Labs    06/26/22 1445 07/13/22 1609 07/25/22 1321  BNP 418.1* 358.7* 461.6*     ProBNP (last 3 results) No results for input(s): "PROBNP" in the last 8760 hours.   D-Dimer No results for input(s): "DDIMER" in the last 72 hours. Hemoglobin A1C No results for input(s): "HGBA1C" in the last 72 hours. Fasting Lipid Panel No results for input(s): "CHOL", "HDL", "LDLCALC", "TRIG", "CHOLHDL", "LDLDIRECT" in the last 72 hours. Thyroid Function Tests No results for input(s): "TSH", "T4TOTAL", "T3FREE", "THYROIDAB" in the last 72 hours.  Invalid input(s): "FREET3"  Other results:   Imaging    No results found.   Medications:     Scheduled Medications:  apixaban  5 mg Oral BID   aspirin EC  81 mg Oral Daily   atorvastatin  80 mg Oral Daily   Chlorhexidine Gluconate Cloth  6 each Topical Daily   insulin aspart  0-15 Units Subcutaneous TID WC   insulin aspart  0-5 Units Subcutaneous QHS   [START ON 08/01/2022] insulin glargine-yfgn  5 Units Subcutaneous Daily   nicotine  21 mg Transdermal Daily   sodium chloride flush  10-40 mL Intracatheter Q12H   sodium chloride flush  3 mL Intravenous Q12H   umeclidinium-vilanterol  1 puff Inhalation Daily    Infusions:  sodium chloride     sodium chloride Stopped (07/26/22 0932)   amiodarone 60 mg/hr (07/31/22  0600)   DOBUTamine 2.5 mcg/kg/min (07/31/22 0600)    PRN Medications: sodium chloride, levalbuterol, ondansetron (ZOFRAN) IV, sodium chloride flush, sodium chloride flush    Patient Profile    [name] is a 71 year old with a history of DMII, COPD, HTN, chronic respiratory failure, tobacco abuse, St Jude ICD, and chronic HFrEF. EF has 25-30% for at least 5 years. Last LHC 2017, normal coronaries.    Presented with increased shortness of breath. New onset A flutter and A/C HFrEF with marked volume overload.    Assessment/Plan   1. Acute on Chronic Hypoxic Respiratory Failure --> Multifactorial AECOPD and A/C HFrEF.  -Started on solumedrol. + inhalers. -Chronically on 2-3 liters [place]. Placed on Bipap on arrival.  - Now on nasal cannula.  - SARS2 negative - CTA - negative PE.  - Improved with diuresis   2. A/C HFrEF-->Cardiogenic Shock - NICM LHC 2017 normal cors. - 2022 Echo EF 25-30% which has been down for many years.  - s/p STJ ICD - Echo this admit LVEF 25-30%, D shaped LV, RV mildly dilated w/ mod elevated RVSP, RV systolic fx mildly reduced, severe BAE, severe TR, mild-mod MR, IVC dilated, assumed RAP 15  - Continue DBA 2.5 mcg. Co-ox 86%. Wean DBA to 1 - CVP 10 Lasix stopped  - Hold off GDMT with AKI and decompensation.  - Possible SGLT2i prior to d/c - c/w unna boots. - No MRI due to ICD Possible RHC prior to d/c once off DBA   3. New Onset A flutter RVR - Of note had TEE 06/2022- no thrombus noted.  -Continue eliquis  -Back in SR today. Continue IV amio today while on DBA.    4. AKI  -Creatinine baseline ~ 1.2  -On admit 1.7-->today 1.97 ->2.09->2.34 -> 2.5 -> 2.6 - Lasix stopped - Follow  - Will check renal u/s  5. DMII, uncontrolled  - On SSI  - Possible SGLT2i prior to d/c   6. Obesity     Length of Stay: [place], MD  07/31/2022, 11:23 AM  Advanced Heart Failure Team Pager [phone] (M-F; 7a - 5p)  Please contact [facility] for  night-coverage after hours (5p -7a ) and weekends on [email or web address]

## 2022-08-01 DIAGNOSIS — I5023 Acute on chronic systolic (congestive) heart failure: Secondary | ICD-10-CM | POA: Diagnosis not present

## 2022-08-01 LAB — BASIC METABOLIC PANEL
Anion gap: 12 (ref 5–15)
BUN: 39 mg/dL — ABNORMAL HIGH (ref 8–23)
CO2: 35 mmol/L — ABNORMAL HIGH (ref 22–32)
Calcium: 8.9 mg/dL (ref 8.9–10.3)
Chloride: 92 mmol/L — ABNORMAL LOW (ref 98–111)
Creatinine, Ser: 2.96 mg/dL — ABNORMAL HIGH (ref 0.44–1.00)
GFR, Estimated: 17 mL/min — ABNORMAL LOW (ref >60)
Glucose, Bld: 73 mg/dL (ref 70–99)
Potassium: 4.2 mmol/L (ref 3.5–5.1)
Sodium: 139 mmol/L (ref 135–145)

## 2022-08-01 LAB — CBC
HCT: 33.1 % — ABNORMAL LOW (ref 36.0–46.0)
Hemoglobin: 10.8 g/dL — ABNORMAL LOW (ref 12.0–15.0)
MCH: 26.4 pg (ref 26.0–34.0)
MCHC: 32.6 g/dL (ref 30.0–36.0)
MCV: 80.9 fL (ref 80.0–100.0)
Platelets: 224 10*3/uL (ref 150–400)
RBC: 4.09 MIL/uL (ref 3.87–5.11)
RDW: 18.4 % — ABNORMAL HIGH (ref 11.5–15.5)
WBC: 8.9 10*3/uL (ref 4.0–10.5)
nRBC: 0 % (ref 0.0–0.2)

## 2022-08-01 LAB — GLUCOSE, CAPILLARY
Glucose-Capillary: 81 mg/dL (ref 70–99)
Glucose-Capillary: 261 mg/dL — ABNORMAL HIGH (ref 70–99)
Glucose-Capillary: 144 mg/dL — ABNORMAL HIGH (ref 70–99)
Glucose-Capillary: 231 mg/dL — ABNORMAL HIGH (ref 70–99)

## 2022-08-01 LAB — COOXEMETRY PANEL
Carboxyhemoglobin: 2.6 % — ABNORMAL HIGH (ref 0.5–1.5)
Methemoglobin: 0.9 % (ref 0.0–1.5)
O2 Saturation: 84.4 %
Total hemoglobin: 11.1 g/dL — ABNORMAL LOW (ref 12.0–16.0)

## 2022-08-01 MED ORDER — AMIODARONE HCL 200 MG PO TABS
200.0000 mg | ORAL_TABLET | Freq: Two times a day (BID) | ORAL | Status: DC
  Administered 2022-08-01 – 2022-08-02 (×3): 200 mg via ORAL
  Filled 2022-08-01 (×3): qty 1

## 2022-08-01 MED ORDER — POLYETHYLENE GLYCOL 3350 17 G PO PACK
17.0000 g | PACK | Freq: Every day | ORAL | Status: DC
  Administered 2022-08-01: 17 g via ORAL
  Filled 2022-08-01 (×4): qty 1

## 2022-08-01 MED ORDER — SENNOSIDES-DOCUSATE SODIUM 8.6-50 MG PO TABS
1.0000 | ORAL_TABLET | Freq: Two times a day (BID) | ORAL | Status: DC
  Administered 2022-08-01 – 2022-08-04 (×5): 1 via ORAL
  Filled 2022-08-01 (×6): qty 1

## 2022-08-01 NOTE — Progress Notes (Addendum)
Advanced Heart Failure Rounding Note  PCP-Cardiologist: None   Subjective:   Admit weight 222--->194.6 today  Admitted with A/C HFrEF and new onset A flutter. Started on amio + heparin. Lasix gtt + DBA.   Echo LVEF 25-30%, D shaped LV, RV mildly dilated w/ mod elevated RVSP, RV systolic fx mildly reduced, severe BAE, severe TR, mild-mod MR, IVC dilated, assumed RAP 15    10/29 DBA cut back to 1 mcg and lasix drip stopped. Renal US no hydronephrosis.   On amio drip. Maintaining Sr.   Weight recorded as down 29  pounds total. CVP 6-7   No complaints.   Objective:   Weight Range: 88.3 kg Body mass index is 35.59 kg/m.   Vital Signs:   Temp:  [97.4 F (36.3 C)-98.1 F (36.7 C)] 98.1 F (36.7 C) (10/30 0746) Pulse Rate:  [77-82] 80 (10/30 0746) Resp:  [18-23] 19 (10/30 0746) BP: (131-136)/(56-65) 131/65 (10/30 0402) SpO2:  [91 %-99 %] 99 % (10/30 0746) Weight:  [88.3 kg] 88.3 kg (10/30 0402) Last BM Date : 07/29/22  Weight change: Filed Weights   07/29/22 0425 07/31/22 0424 08/01/22 0402  Weight: 98.4 kg 90.3 kg 88.3 kg    Intake/Output:   Intake/Output Summary (Last 24 hours) at 08/01/2022 0750 Last data filed at 08/01/2022 0500 Gross per 24 hour  Intake 567.56 ml  Output 2900 ml  Net -2332.44 ml   CVP 6-7    Physical Exam  General:  No resp difficulty HEENT: normal Neck: supple. no JVD. Carotids 2+ bilat; no bruits. No lymphadenopathy or thryomegaly appreciated. Cor: PMI nondisplaced. Regular rate & rhythm. No rubs, gallops or murmurs. Lungs: clear Abdomen: soft, nontender, nondistended. No hepatosplenomegaly. No bruits or masses. Good bowel sounds. Extremities: no cyanosis, clubbing, rash, edema. R and LLE unna boots. RUE PICC Neuro: alert & orientedx3, cranial nerves grossly intact. moves all 4 extremities w/o difficulty. Affect pleasant   Telemetry   SR 70-80s personally checked.   Labs    CBC Recent Labs    07/31/22 0424 08/01/22 0358   WBC 7.5 8.9  HGB 10.4* 10.8*  HCT 32.8* 33.1*  MCV 81.6 80.9  PLT 227 086   Basic Metabolic Panel Recent Labs    07/31/22 0424 08/01/22 0358  NA 139 139  K 4.2 4.2  CL 92* 92*  CO2 34* 35*  GLUCOSE 75 73  BUN 40* 39*  CREATININE 2.64* 2.96*  CALCIUM 9.2 8.9   Liver Function Tests No results for input(s): "AST", "ALT", "ALKPHOS", "BILITOT", "PROT", "ALBUMIN" in the last 72 hours.  No results for input(s): "LIPASE", "AMYLASE" in the last 72 hours. Cardiac Enzymes No results for input(s): "CKTOTAL", "CKMB", "CKMBINDEX", "TROPONINI" in the last 72 hours.  BNP: BNP (last 3 results) Recent Labs    06/26/22 1445 07/13/22 1609 07/25/22 1321  BNP 418.1* 358.7* 461.6*    ProBNP (last 3 results) No results for input(s): "PROBNP" in the last 8760 hours.   D-Dimer No results for input(s): "DDIMER" in the last 72 hours. Hemoglobin A1C No results for input(s): "HGBA1C" in the last 72 hours. Fasting Lipid Panel No results for input(s): "CHOL", "HDL", "LDLCALC", "TRIG", "CHOLHDL", "LDLDIRECT" in the last 72 hours. Thyroid Function Tests No results for input(s): "TSH", "T4TOTAL", "T3FREE", "THYROIDAB" in the last 72 hours.  Invalid input(s): "FREET3"  Other results:   Imaging    US RENAL  Result Date: 07/31/2022 CLINICAL DATA:  Acute kidney injury. EXAM: RENAL / URINARY TRACT ULTRASOUND COMPLETE COMPARISON:  CT 02/17/2017 FINDINGS: Right Kidney: Renal measurements: 10.7 x 4.9 x 4.9 cm = volume: 139 mL. No hydronephrosis. Diffusely increased renal parenchymal echogenicity. No renal calculi or focal lesion. Left Kidney: Renal measurements: 9.7 x 5.9 x 5.4 cm = volume: 160 mL. No hydronephrosis. Diffusely increased renal parenchymal echogenicity. No renal calculi or focal lesion. Bladder: Appears normal for degree of bladder distention. Both ureteral jets are demonstrated. Other: None. IMPRESSION: 1. Mild increased renal parenchymal echogenicity typical of chronic medical  renal disease. 2. No hydronephrosis or focal renal abnormality Electronically Signed   By: Keith Rake M.D.   On: 07/31/2022 14:44     Medications:     Scheduled Medications:  apixaban  5 mg Oral BID   aspirin EC  81 mg Oral Daily   atorvastatin  80 mg Oral Daily   Chlorhexidine Gluconate Cloth  6 each Topical Daily   insulin aspart  0-15 Units Subcutaneous TID WC   insulin aspart  0-5 Units Subcutaneous QHS   insulin glargine-yfgn  5 Units Subcutaneous Daily   nicotine  21 mg Transdermal Daily   sodium chloride flush  10-40 mL Intracatheter Q12H   sodium chloride flush  3 mL Intravenous Q12H   umeclidinium-vilanterol  1 puff Inhalation Daily    Infusions:  sodium chloride     sodium chloride Stopped (07/26/22 0932)   amiodarone 30 mg/hr (08/01/22 0500)    PRN Medications: sodium chloride, levalbuterol, ondansetron (ZOFRAN) IV, sodium chloride flush, sodium chloride flush    Patient Profile    [name] is a 71 year old with a history of DMII, COPD, HTN, chronic respiratory failure, tobacco abuse, St Jude ICD, and chronic HFrEF. EF has 25-30% for at least 5 years. Last LHC 2017, normal coronaries.    Presented with increased shortness of breath. New onset A flutter and A/C HFrEF with marked volume overload.    Assessment/Plan   1. Acute on Chronic Hypoxic Respiratory Failure --> Multifactorial AECOPD and A/C HFrEF.  -Started on solumedrol. + inhalers. -Chronically on 2-3 liters [place]. Placed on Bipap on arrival.  - Now on nasal cannula.  - SARS2 negative - CTA - negative PE.  - stable on 2 ltiers.    2. A/C HFrEF-->Cardiogenic Shock - NICM LHC 2017 normal cors. - 2022 Echo EF 25-30% which has been down for many years.  - s/p STJ ICD - Echo this admit LVEF 25-30%, D shaped LV, RV mildly dilated w/ mod elevated RVSP, RV systolic fx mildly reduced, severe BAE, severe TR, mild-mod MR, IVC dilated, assumed RAP 15  - CO-OX stable. Stop Dobutamine.  - CVP 6-7. Overall  diuresed 29 pounds. Continue to hold diuretics. TOld her she can drink a little extra today.  - Hold off GDMT with AKI and decompensation.  - Possible SGLT2i prior to d/c - c/w unna boots. - No MRI due to ICD Possible RHC prior to d/c once off DBA   3. New Onset A flutter RVR - Of note had TEE 06/2022- no thrombus noted.  -Continue eliquis  -Maintaining SR.  - stop IV amio. Switch to amio 200 mg twice a day.   4. AKI  -Creatinine baseline ~ 1.2  -On admit 1.7-->today 1.97 ->2.09->2.34 -> 2.5 -> 2.6->2.96 -   Renal u/s- no hydronephrosis - Suspect over diuresed.   5. DMII, uncontrolled  - On SSI  - Possible SGLT2i prior to d/c   6. Obesity   Continue to mobilize.   Length of Stay: 7  Peggy Grinder, NP  08/01/2022, 7:50 AM  Advanced Heart Failure Team Pager [phone] (M-F; 7a - 5p)  Please contact [facility] for night-coverage after hours (5p -7a ) and weekends on [email or web address]   Patient seen and examined with the above-signed Advanced Practice Provider and/or Housestaff. I personally reviewed laboratory data, imaging studies and relevant notes. I independently examined the patient and formulated the important aspects of the plan. I have edited the note to reflect any of my changes or salient points. I have personally discussed the plan with the patient and/or family.  Remains on DBA. Co-ox ok. CVP 6-7. Off lasix. Scr worse.Denies CP or SOB.  Renal u/s with chronic kidney disease. Remains in NSR.   General:  Sitting in chair  No resp difficulty HEENT: normal Neck: supple. JVP 6-7 Carotids 2+ bilat; no bruits. No lymphadenopathy or thryomegaly appreciated. Cor: PMI nondisplaced. Regular rate & rhythm. No rubs, gallops or murmurs. Lungs: clear Abdomen: obese soft, nontender, nondistended. No hepatosplenomegaly. No bruits or masses. Good bowel sounds. Extremities: no cyanosis, clubbing, rash, edema Neuro: alert & orientedx3, cranial nerves grossly intact. moves all 4 extremities  w/o difficulty. Affect pleasant  Volume status and co-ox ok. SCR worse. Remains in NSR. Continue to hold diuretics. Probable RHC tomorrow depending on course.   Glori Bickers, MD  12:57 PM

## 2022-08-01 NOTE — Progress Notes (Signed)
Bipap not indicated at this time. 

## 2022-08-01 NOTE — Progress Notes (Signed)
Physical Therapy Treatment Patient Details Name: [name] MRN: [id number] DOB: [birth year] Today's Date: 08/01/2022   History of Present Illness Pt is a 71 y.o. female who presented 07/25/22 with cough, SOB, and lower extremity edema. Pt admitted with A/C HFrEF and new onset A flutter. PMH: COPD Gold stage II with chronic hypoxic respite failure on nocturnal nasal cannula, chronic combined HFpEF and HFrEF, LVEF 20-25%, status post AICD, IIDM, morbid obesity, cigarette smoker, HTN, HLD, lipoma    PT Comments    Patient progressing well towards PT goals. Session focused on progressive ambulation and endurance. Pt reports feeling better today with less fluid on LEs. Improved ambulation distance with min guard assist and use of RW for support. VSS on 2L/min 02 [place], decreased 02 to 1L at rest as pt usually only wears supplemental 02 at night. Encouraged increasing activity daily. Will plan for stair training next session as tolerated to prepare for d/c home. Will follow.   Recommendations for follow up therapy are one component of a multi-disciplinary discharge planning process, led by the attending physician.  Recommendations may be updated based on patient status, additional functional criteria and insurance authorization.  Follow Up Recommendations  Home health PT     Assistance Recommended at Discharge PRN  Patient can return home with the following Assistance with cooking/housework;Help with stairs or ramp for entrance;Assist for transportation   Equipment Recommendations  None recommended by PT;Rolling walker (2 wheels)    Recommendations for Other Services       Precautions / Restrictions Precautions Precautions: Fall Restrictions Weight Bearing Restrictions: No     Mobility  Bed Mobility               General bed mobility comments: Up in chair upon PT arrival.    Transfers Overall transfer level: Needs assistance Equipment used: Rolling walker (2  wheels) Transfers: Sit to/from Stand Sit to Stand: Supervision           General transfer comment: Supervision for safety. Stood from Youth worker.    Ambulation/Gait Ambulation/Gait assistance: Min guard Gait Distance (Feet): 150 Feet Assistive device: Rolling walker (2 wheels) Gait Pattern/deviations: Step-through pattern, Decreased stride length, Wide base of support Gait velocity: decr Gait velocity interpretation: 1.31 - 2.62 ft/sec, indicative of limited community ambulator   General Gait Details: Slow, steady gait with a few standing rest breaks, able to take 1 hand off RW to fix hair scarf, lessened BoS today due to less fluid on LEs. Sp02 100% on 2L/min 02 [place], decreased to 1 L at rest.   Stairs             Wheelchair Mobility    Modified Rankin (Stroke Patients Only)       Balance Overall balance assessment: Needs assistance Sitting-balance support: No upper extremity supported, Feet supported Sitting balance-Leahy Scale: Good     Standing balance support: During functional activity Standing balance-Leahy Scale: Fair Standing balance comment: Static standing with min guard without device. Assist/device for any dynamic movement but only needs 1 UE support.                            Cognition Arousal/Alertness: Awake/alert Behavior During Therapy: WFL for tasks assessed/performed Overall Cognitive Status: Within Functional Limits for tasks assessed  Exercises      General Comments General comments (skin integrity, edema, etc.): VSS on 2L/min 02 [place].      Pertinent Vitals/Pain Pain Assessment Pain Assessment: No/denies pain    Home Living                          Prior Function            PT Goals (current goals can now be found in the care plan section) Progress towards PT goals: Progressing toward goals    Frequency    Min 3X/week      PT Plan Current  plan remains appropriate    Co-evaluation              AM-PAC PT "6 Clicks" Mobility   Outcome Measure  Help needed turning from your back to your side while in a flat bed without using bedrails?: A Little Help needed moving from lying on your back to sitting on the side of a flat bed without using bedrails?: A Little Help needed moving to and from a bed to a chair (including a wheelchair)?: A Little Help needed standing up from a chair using your arms (e.g., wheelchair or bedside chair)?: A Little Help needed to walk in hospital room?: A Little Help needed climbing 3-5 steps with a railing? : A Little 6 Click Score: 18    End of Session Equipment Utilized During Treatment: Gait belt Activity Tolerance: Patient tolerated treatment well Patient left: in chair;with call bell/phone within reach;with nursing/sitter in room Nurse Communication: Mobility status PT Visit Diagnosis: Unsteadiness on feet (R26.81);Other abnormalities of gait and mobility (R26.89);Muscle weakness (generalized) (M62.81);Difficulty in walking, not elsewhere classified (R26.2);History of falling (Z91.81)     Time: 6073-7106 PT Time Calculation (min) (ACUTE ONLY): 18 min  Charges:  $Gait Training: 8-22 mins                     Marisa Severin, PT, DPT Acute Rehabilitation Services Secure chat preferred Office [place] 08/01/2022, 2:36 PM

## 2022-08-01 NOTE — Progress Notes (Signed)
BIPAP not needed at this time 

## 2022-08-01 NOTE — Progress Notes (Signed)
PROGRESS NOTE    [name]  JGO:[id number] DOB: [birth year] DOA: 07/25/2022 PCP: [name], MD  70/F with history of chronic systolic CHF, EF 55-97%, COPD, chronic respiratory failure on 2 L home O2, tobacco use, type 2 diabetes mellitus, hypertension presented to the ED with increased shortness of breath. -Noted to be in atrial flutter with volume overload, then complicated by cardiogenic shock -Started amiodarone, converted to sinus -Advanced heart failure team following, started on dobutamine 10/24 and diuretics -10/25 started on Lasix gtt. -10/26 onwards started responding to diuretics, creatinine trending up slowly  Subjective: -Feels better overall, breathing improving, ambulating more, set up in the chair most of the day yesterday  Assessment and Plan:  Acute on chronic systolic CHF  Cardiogenic shock, low output state Echo EF 25 to 30%, septal dyskinesis, RV with mild reduction in systolic function, moderate dilatation of RA, moderate TR.  -Advanced heart failure team following, started on dobutamine 10/24 with diuretics -Diuresed well in the last 4 to 5 days, down 29 LB, Lasix GTT stopped yesterday, dobutamine discontinued today, creatinine up to 2.9, may need right heart cath -Per heart failure team, GDMT limited by AKI -Poor dietary compliance noted as well, eats fast food several times a week and canned food too -Dietician insulted -Increase activity, PT following-> Home health PT recommended  Atrial flutter with rapid ventricular response (HCC) -on IV amiodarone, in sinus rhythm now, apixaban resumed -Change to p.o. amiodarone  Acute kidney injury on CKD stage 3a -Baseline creatinine around 1.2-1.4 -Worsened in the setting of cardiogenic shock, cardiorenal syndrome - avoid hypotension - now 2.9, see discussion above, diuretics discontinued  Acute metabolic encephalopathy -Likely from cardiogenic shock, hypoperfusion -Improving  COPD (chronic obstructive  pulmonary disease) (HCC) -Off systemic steroids, continue Xopenex, Anoro Ellipta  Type 2 diabetes mellitus with hyperlipidemia (HCC) -CBGs stable, decrease glargine dose and discontinue meal coverage with worsening AKI  Obesity (BMI 30-39.9) Calculate BMI    DVT prophylaxis: Apixaban Code Status: Full code Family Communication: None present Disposition Plan: Home likely 48 hours  Consultants: CHF team   Procedures:   Antimicrobials:    Objective: Vitals:   08/01/22 0402 08/01/22 0746 08/01/22 0844 08/01/22 1140  BP: 131/65 132/71  122/60  Pulse: 77 80 73 80  Resp: '18 19 18 18  '$ Temp: 97.9 F (36.6 C) 98.1 F (36.7 C)  97.9 F (36.6 C)  TempSrc: Oral Oral  Oral  SpO2: 96% 99% 98% 99%  Weight: 88.3 kg       Intake/Output Summary (Last 24 hours) at 08/01/2022 1206 Last data filed at 08/01/2022 1020 Gross per 24 hour  Intake 1039.56 ml  Output 2300 ml  Net -1260.44 ml   Filed Weights   07/29/22 0425 07/31/22 0424 08/01/22 0402  Weight: 98.4 kg 90.3 kg 88.3 kg    Examination:  General exam: Obese pleasant female sitting up in bed, AAOx3, no distress HEENT: No JVD CVS: S1-S2, regular rhythm Lungs: Clear bilaterally Abdomen: Soft, nontender, bowel sounds present Remedies: No edema, [place] boots on, right arm PICC line noted  Skin: Taut skin Psychiatry:  Mood & affect appropriate.     Data Reviewed:   CBC: Recent Labs  Lab 07/25/22 1320 07/25/22 1325 07/28/22 0527 07/29/22 0500 07/30/22 0610 07/31/22 0424 08/01/22 0358  WBC 8.5   < > 9.1 7.5 7.6 7.5 8.9  NEUTROABS 6.3  --   --   --   --   --   --   HGB 12.5   < >  9.7* 10.3* 9.9* 10.4* 10.8*  HCT 41.6   < > 30.8* 32.5* 30.5* 32.8* 33.1*  MCV 85.6   < > 83.7 81.7 80.9 81.6 80.9  PLT 275   < > 202 221 206 227 224   < > = values in this interval not displayed.   Basic Metabolic Panel: Recent Labs  Lab 07/25/22 1318 07/25/22 1320 07/28/22 0527 07/29/22 0500 07/30/22 0610 07/31/22 0424  08/01/22 0358  NA  --    < > 136 138 137 139 139  K  --    < > 4.7 4.8 4.7 4.2 4.2  CL  --    < > 96* 95* 90* 92* 92*  CO2  --    < > 30 31 32 34* 35*  GLUCOSE  --    < > 98 62* 111* 75 73  BUN  --    < > 44* 41* 42* 40* 39*  CREATININE  --    < > 2.09* 2.34* 2.51* 2.64* 2.96*  CALCIUM  --    < > 8.5* 9.3 9.2 9.2 8.9  MG 2.1  --  2.0  --   --   --   --    < > = values in this interval not displayed.   GFR: Estimated Creatinine Clearance: 18.8 mL/min (A) (by C-G formula based on SCr of 2.96 mg/dL (H)). Liver Function Tests: Recent Labs  Lab 07/25/22 1320  AST 45*  ALT 36  ALKPHOS 266*  BILITOT 1.6*  PROT 7.4  ALBUMIN 2.8*   No results for input(s): "LIPASE", "AMYLASE" in the last 168 hours. No results for input(s): "AMMONIA" in the last 168 hours. Coagulation Profile: No results for input(s): "INR", "PROTIME" in the last 168 hours. Cardiac Enzymes: No results for input(s): "CKTOTAL", "CKMB", "CKMBINDEX", "TROPONINI" in the last 168 hours. BNP (last 3 results) No results for input(s): "PROBNP" in the last 8760 hours. HbA1C: No results for input(s): "HGBA1C" in the last 72 hours. CBG: Recent Labs  Lab 07/31/22 1148 07/31/22 1659 07/31/22 2140 08/01/22 0747 08/01/22 1119  GLUCAP 153* 243* 107* 81 261*   Lipid Profile: No results for input(s): "CHOL", "HDL", "LDLCALC", "TRIG", "CHOLHDL", "LDLDIRECT" in the last 72 hours. Thyroid Function Tests: No results for input(s): "TSH", "T4TOTAL", "FREET4", "T3FREE", "THYROIDAB" in the last 72 hours. Anemia Panel: No results for input(s): "VITAMINB12", "FOLATE", "FERRITIN", "TIBC", "IRON", "RETICCTPCT" in the last 72 hours. Urine analysis:    Component Value Date/Time   COLORURINE YELLOW 06/26/2022 [place] 06/26/2022 1725   LABSPEC 1.020 06/26/2022 1725   PHURINE 5.5 06/26/2022 1725   GLUCOSEU 100 (A) 06/26/2022 1725   HGBUR NEGATIVE 06/26/2022 1725   HGBUR negative 11/08/2010 0854   BILIRUBINUR  NEGATIVE 06/26/2022 1725   KETONESUR NEGATIVE 06/26/2022 1725   PROTEINUR NEGATIVE 06/26/2022 1725   UROBILINOGEN 1.0 01/26/2015 2028   NITRITE NEGATIVE 06/26/2022 1725   LEUKOCYTESUR NEGATIVE 06/26/2022 1725   Sepsis Labs: '@LABRCNTIP'$ (procalcitonin:4,lacticidven:4)  ) Recent Results (from the past 240 hour(s))  Resp Panel by RT-PCR (Flu A&B, Covid) Anterior Nasal Swab     Status: None   Collection Time: 07/25/22  1:21 PM   Specimen: Anterior Nasal Swab  Result Value Ref Range Status   SARS Coronavirus 2 by RT PCR NEGATIVE NEGATIVE Final    Comment: (NOTE) SARS-CoV-2 target nucleic acids are NOT DETECTED.  The SARS-CoV-2 RNA is generally detectable in upper respiratory specimens during the acute phase of infection. The lowest concentration of SARS-CoV-2 viral  copies this assay can detect is 138 copies/mL. A negative result does not preclude SARS-Cov-2 infection and should not be used as the sole basis for treatment or other patient management decisions. A negative result may occur with  improper specimen collection/handling, submission of specimen other than nasopharyngeal swab, presence of viral mutation(s) within the areas targeted by this assay, and inadequate number of viral copies(<138 copies/mL). A negative result must be combined with clinical observations, patient history, and epidemiological information. The expected result is Negative.  Fact Sheet for Patients:  EntrepreneurPulse.com.au  Fact Sheet for Healthcare Providers:  IncredibleEmployment.be  This test is no t yet approved or cleared by the [place] FDA and  has been authorized for detection and/or diagnosis of SARS-CoV-2 by FDA under an Emergency Use Authorization (EUA). This EUA will remain  in effect (meaning this test can be used) for the duration of the COVID-19 declaration under Section 564(b)(1) of the Act, 21 U.S.C.section 360bbb-3(b)(1), unless the  authorization is terminated  or revoked sooner.       Influenza A by PCR NEGATIVE NEGATIVE Final   Influenza B by PCR NEGATIVE NEGATIVE Final    Comment: (NOTE) The Xpert Xpress SARS-CoV-2/FLU/RSV plus assay is intended as an aid in the diagnosis of influenza from Nasopharyngeal swab specimens and should not be used as a sole basis for treatment. Nasal washings and aspirates are unacceptable for Xpert Xpress SARS-CoV-2/FLU/RSV testing.  Fact Sheet for Patients: EntrepreneurPulse.com.au  Fact Sheet for Healthcare Providers: IncredibleEmployment.be  This test is not yet approved or cleared by the [place] FDA and has been authorized for detection and/or diagnosis of SARS-CoV-2 by FDA under an Emergency Use Authorization (EUA). This EUA will remain in effect (meaning this test can be used) for the duration of the COVID-19 declaration under Section 564(b)(1) of the Act, 21 U.S.C. section 360bbb-3(b)(1), unless the authorization is terminated or revoked.  Performed at [facility], [place] [address] [place], [place] [address]      Radiology Studies: US RENAL  Result Date: 07/31/2022 CLINICAL DATA:  Acute kidney injury. EXAM: RENAL / URINARY TRACT ULTRASOUND COMPLETE COMPARISON:  CT 02/17/2017 FINDINGS: Right Kidney: Renal measurements: 10.7 x 4.9 x 4.9 cm = volume: 139 mL. No hydronephrosis. Diffusely increased renal parenchymal echogenicity. No renal calculi or focal lesion. Left Kidney: Renal measurements: 9.7 x 5.9 x 5.4 cm = volume: 160 mL. No hydronephrosis. Diffusely increased renal parenchymal echogenicity. No renal calculi or focal lesion. Bladder: Appears normal for degree of bladder distention. Both ureteral jets are demonstrated. Other: None. IMPRESSION: 1. Mild increased renal parenchymal echogenicity typical of chronic medical renal disease. 2. No hydronephrosis or focal renal abnormality Electronically Signed   By: Keith Rake M.D.   On: 07/31/2022 14:44     Scheduled Meds:  amiodarone  200 mg Oral BID   apixaban  5 mg Oral BID   aspirin EC  81 mg Oral Daily   atorvastatin  80 mg Oral Daily   Chlorhexidine Gluconate Cloth  6 each Topical Daily   insulin aspart  0-15 Units Subcutaneous TID WC   insulin aspart  0-5 Units Subcutaneous QHS   insulin glargine-yfgn  5 Units Subcutaneous Daily   nicotine  21 mg Transdermal Daily   polyethylene glycol  17 g Oral Daily   senna-docusate  1 tablet Oral BID   sodium chloride flush  10-40 mL Intracatheter Q12H   sodium chloride flush  3 mL Intravenous Q12H   umeclidinium-vilanterol  1 puff  Inhalation Daily   Continuous Infusions:  sodium chloride     sodium chloride Stopped (07/26/22 0932)     LOS: 7 days    Time spent: 12mn    PDomenic Polite MD [facility]   08/01/2022, 12:06 PM

## 2022-08-02 DIAGNOSIS — I5023 Acute on chronic systolic (congestive) heart failure: Secondary | ICD-10-CM | POA: Diagnosis not present

## 2022-08-02 LAB — GLUCOSE, CAPILLARY
Glucose-Capillary: 160 mg/dL — ABNORMAL HIGH (ref 70–99)
Glucose-Capillary: 141 mg/dL — ABNORMAL HIGH (ref 70–99)
Glucose-Capillary: 344 mg/dL — ABNORMAL HIGH (ref 70–99)
Glucose-Capillary: 344 mg/dL — ABNORMAL HIGH (ref 70–99)
Glucose-Capillary: 148 mg/dL — ABNORMAL HIGH (ref 70–99)

## 2022-08-02 LAB — BASIC METABOLIC PANEL
Anion gap: 12 (ref 5–15)
BUN: 39 mg/dL — ABNORMAL HIGH (ref 8–23)
CO2: 32 mmol/L (ref 22–32)
Calcium: 8.6 mg/dL — ABNORMAL LOW (ref 8.9–10.3)
Chloride: 94 mmol/L — ABNORMAL LOW (ref 98–111)
Creatinine, Ser: 3.15 mg/dL — ABNORMAL HIGH (ref 0.44–1.00)
GFR, Estimated: 15 mL/min — ABNORMAL LOW (ref >60)
Glucose, Bld: 88 mg/dL (ref 70–99)
Potassium: 3.6 mmol/L (ref 3.5–5.1)
Sodium: 138 mmol/L (ref 135–145)

## 2022-08-02 LAB — COOXEMETRY PANEL
Carboxyhemoglobin: 1.5 % (ref 0.5–1.5)
Methemoglobin: 1.2 % (ref 0.0–1.5)
O2 Saturation: 70.3 %
Total hemoglobin: 11.2 g/dL — ABNORMAL LOW (ref 12.0–16.0)

## 2022-08-02 MED ORDER — AMIODARONE HCL IN DEXTROSE 360-4.14 MG/200ML-% IV SOLN
60.0000 mg/h | INTRAVENOUS | Status: AC
  Administered 2022-08-02 (×2): 60 mg/h via INTRAVENOUS
  Filled 2022-08-02 (×2): qty 200

## 2022-08-02 MED ORDER — ASPIRIN 81 MG PO CHEW
81.0000 mg | CHEWABLE_TABLET | ORAL | Status: AC
  Administered 2022-08-03: 81 mg via ORAL
  Filled 2022-08-02: qty 1

## 2022-08-02 MED ORDER — SODIUM CHLORIDE 0.9 % IV SOLN: 250.0000 mL | INTRAVENOUS | Status: DC | PRN

## 2022-08-02 MED ORDER — SODIUM CHLORIDE 0.9% FLUSH
3.0000 mL | Freq: Two times a day (BID) | INTRAVENOUS | Status: DC
  Administered 2022-08-02 – 2022-08-03 (×2): 3 mL via INTRAVENOUS

## 2022-08-02 MED ORDER — SODIUM CHLORIDE 0.9% FLUSH: 3.0000 mL | INTRAVENOUS | Status: DC | PRN

## 2022-08-02 MED ORDER — AMIODARONE HCL IN DEXTROSE 360-4.14 MG/200ML-% IV SOLN
30.0000 mg/h | INTRAVENOUS | Status: DC
  Administered 2022-08-02 – 2022-08-04 (×5): 30 mg/h via INTRAVENOUS
  Filled 2022-08-02 (×4): qty 200

## 2022-08-02 MED ORDER — ANGIOPLASTY BOOK
Freq: Once | Status: DC
  Filled 2022-08-02: qty 1

## 2022-08-02 MED ORDER — AMIODARONE LOAD VIA INFUSION
150.0000 mg | Freq: Once | INTRAVENOUS | Status: AC
  Administered 2022-08-02: 150 mg via INTRAVENOUS
  Filled 2022-08-02: qty 83.34

## 2022-08-02 MED ORDER — SODIUM CHLORIDE 0.9 % IV SOLN: INTRAVENOUS | Status: DC

## 2022-08-02 NOTE — Progress Notes (Signed)
PROGRESS NOTE    [name]  DJM:[id number] DOB: 05-Oct-1950 DOA: 07/25/2022 PCP: [name], MD  70/F with history of chronic systolic CHF, EF 22-29%, COPD, chronic respiratory failure on 2 L home O2, tobacco use, type 2 diabetes mellitus, hypertension presented to the ED with increased shortness of breath. -Noted to be in atrial flutter with volume overload, then complicated by cardiogenic shock -Started amiodarone, converted to sinus -Advanced heart failure team following, started on dobutamine 10/24 and diuretics -10/25 started on Lasix gtt. -10/26 onwards started responding to diuretics, creatinine trending up slowly -10/30, dobutamine discontinued -10/29-10/31 with worsening kidney function  Subjective: -Feels better overall, breathing better, ambulating more, wants to go home  Assessment and Plan:  Acute on chronic systolic CHF  Cardiogenic shock, low output state Echo EF 25 to 30%, septal dyskinesis, RV with mild reduction in systolic function, moderate dilatation of RA, moderate TR.  -Advanced heart failure team following, started on dobutamine 10/24 with diuretics -Diuresed well, weight down 29 LB, Lasix GTT stopped 2 days ago, dobutamine discontinued 10/30, now creatinine up to 3.19 -Per heart failure team, GDMT limited by AKI -Right heart cath planned, possibly tomorrow -Increase activity, PT following-> Home health PT recommended  Atrial flutter with rapid ventricular response ([place]) -Had converted to sinus rhythm, now back in A-fib this morning, getting Amio bolus -Continue Eliquis  Acute kidney injury on CKD stage 3a -Baseline creatinine around 1.2-1.4 -Worsened in the setting of cardiogenic shock, cardiorenal syndrome - avoid hypotension -Now 3.19, see discussion above, off diuretics and inotropes  Acute metabolic encephalopathy -Likely from cardiogenic shock, hypoperfusion -Improving  COPD (chronic obstructive pulmonary disease) (HCC) -Off systemic  steroids, continue Xopenex, Anoro Ellipta  Type 2 diabetes mellitus with hyperlipidemia (HCC) -CBGs stable, decreased glargine dose and discontinued meal coverage with worsening AKI  Obesity (BMI 30-39.9) Calculate BMI    DVT prophylaxis: Apixaban Code Status: Full code Family Communication: None present Disposition Plan: Home likely 48 hours pending improvement in kidney function  Consultants: CHF team   Procedures:   Antimicrobials:    Objective: Vitals:   08/02/22 0310 08/02/22 0624 08/02/22 0740 08/02/22 0742  BP: (!) 121/54  129/61   Pulse: 85 85 83   Resp: (!) 23 (!) 21 20   Temp: 97.9 F (36.6 C)  98 F (36.7 C)   TempSrc: Oral  Oral   SpO2: 91% 91% 93% 93%  Weight:  88 kg      Intake/Output Summary (Last 24 hours) at 08/02/2022 1154 Last data filed at 08/02/2022 7989 Gross per 24 hour  Intake --  Output 500 ml  Net -500 ml   Filed Weights   07/31/22 0424 08/01/22 0402 08/02/22 0624  Weight: 90.3 kg 88.3 kg 88 kg    Examination:  General exam: Obese pleasant female sitting up in bed, AAOx3, no distress HEENT: No JVD CVS: S1-S2,  irregular rhythm Lungs: Clear bilaterally Abdomen: Soft, nontender, bowel sounds present Extremities: No edema, Unna boots on, right arm PICC line noted  Skin: Taut skin Psychiatry:  Mood & affect appropriate.     Data Reviewed:   CBC: Recent Labs  Lab 07/28/22 0527 07/29/22 0500 07/30/22 0610 07/31/22 0424 08/01/22 0358  WBC 9.1 7.5 7.6 7.5 8.9  HGB 9.7* 10.3* 9.9* 10.4* 10.8*  HCT 30.8* 32.5* 30.5* 32.8* 33.1*  MCV 83.7 81.7 80.9 81.6 80.9  PLT 202 221 206 227 211   Basic Metabolic Panel: Recent Labs  Lab 07/28/22 0527 07/29/22 0500 07/30/22 0610 07/31/22  0424 08/01/22 0358 08/02/22 0242  NA 136 138 137 139 139 138  K 4.7 4.8 4.7 4.2 4.2 3.6  CL 96* 95* 90* 92* 92* 94*  CO2 30 31 32 34* 35* 32  GLUCOSE 98 62* 111* 75 73 88  BUN 44* 41* 42* 40* 39* 39*  CREATININE 2.09* 2.34* 2.51* 2.64* 2.96*  3.15*  CALCIUM 8.5* 9.3 9.2 9.2 8.9 8.6*  MG 2.0  --   --   --   --   --    GFR: Estimated Creatinine Clearance: 17.6 mL/min (A) (by C-G formula based on SCr of 3.15 mg/dL (H)). Liver Function Tests: No results for input(s): "AST", "ALT", "ALKPHOS", "BILITOT", "PROT", "ALBUMIN" in the last 168 hours.  No results for input(s): "LIPASE", "AMYLASE" in the last 168 hours. No results for input(s): "AMMONIA" in the last 168 hours. Coagulation Profile: No results for input(s): "INR", "PROTIME" in the last 168 hours. Cardiac Enzymes: No results for input(s): "CKTOTAL", "CKMB", "CKMBINDEX", "TROPONINI" in the last 168 hours. BNP (last 3 results) No results for input(s): "PROBNP" in the last 8760 hours. HbA1C: No results for input(s): "HGBA1C" in the last 72 hours. CBG: Recent Labs  Lab 08/01/22 1119 08/01/22 1636 08/01/22 2205 08/02/22 0806 08/02/22 1139  GLUCAP 261* 144* 231* 160* 141*   Lipid Profile: No results for input(s): "CHOL", "HDL", "LDLCALC", "TRIG", "CHOLHDL", "LDLDIRECT" in the last 72 hours. Thyroid Function Tests: No results for input(s): "TSH", "T4TOTAL", "FREET4", "T3FREE", "THYROIDAB" in the last 72 hours. Anemia Panel: No results for input(s): "VITAMINB12", "FOLATE", "FERRITIN", "TIBC", "IRON", "RETICCTPCT" in the last 72 hours. Urine analysis:    Component Value Date/Time   COLORURINE YELLOW 06/26/2022 [place] 06/26/2022 1725   LABSPEC 1.020 06/26/2022 1725   PHURINE 5.5 06/26/2022 1725   GLUCOSEU 100 (A) 06/26/2022 1725   HGBUR NEGATIVE 06/26/2022 1725   HGBUR negative 11/08/2010 0854   BILIRUBINUR NEGATIVE 06/26/2022 1725   KETONESUR NEGATIVE 06/26/2022 1725   PROTEINUR NEGATIVE 06/26/2022 1725   UROBILINOGEN 1.0 01/26/2015 2028   NITRITE NEGATIVE 06/26/2022 1725   LEUKOCYTESUR NEGATIVE 06/26/2022 1725   Sepsis Labs: '@LABRCNTIP'$ (procalcitonin:4,lacticidven:4)  ) Recent Results (from the past 240 hour(s))  Resp Panel by RT-PCR (Flu  A&B, Covid) Anterior Nasal Swab     Status: None   Collection Time: 07/25/22  1:21 PM   Specimen: Anterior Nasal Swab  Result Value Ref Range Status   SARS Coronavirus 2 by RT PCR NEGATIVE NEGATIVE Final    Comment: (NOTE) SARS-CoV-2 target nucleic acids are NOT DETECTED.  The SARS-CoV-2 RNA is generally detectable in upper respiratory specimens during the acute phase of infection. The lowest concentration of SARS-CoV-2 viral copies this assay can detect is 138 copies/mL. A negative result does not preclude SARS-Cov-2 infection and should not be used as the sole basis for treatment or other patient management decisions. A negative result may occur with  improper specimen collection/handling, submission of specimen other than nasopharyngeal swab, presence of viral mutation(s) within the areas targeted by this assay, and inadequate number of viral copies(<138 copies/mL). A negative result must be combined with clinical observations, patient history, and epidemiological information. The expected result is Negative.  Fact Sheet for Patients:  EntrepreneurPulse.com.au  Fact Sheet for Healthcare Providers:  IncredibleEmployment.be  This test is no t yet approved or cleared by the [place] FDA and  has been authorized for detection and/or diagnosis of SARS-CoV-2 by FDA under an Emergency Use Authorization (EUA). This EUA will remain  in effect (meaning this test can be used) for the duration of the COVID-19 declaration under Section 564(b)(1) of the Act, 21 U.S.C.section 360bbb-3(b)(1), unless the authorization is terminated  or revoked sooner.       Influenza A by PCR NEGATIVE NEGATIVE Final   Influenza B by PCR NEGATIVE NEGATIVE Final    Comment: (NOTE) The Xpert Xpress SARS-CoV-2/FLU/RSV plus assay is intended as an aid in the diagnosis of influenza from Nasopharyngeal swab specimens and should not be used as a sole basis for treatment.  Nasal washings and aspirates are unacceptable for Xpert Xpress SARS-CoV-2/FLU/RSV testing.  Fact Sheet for Patients: EntrepreneurPulse.com.au  Fact Sheet for Healthcare Providers: IncredibleEmployment.be  This test is not yet approved or cleared by the [place] FDA and has been authorized for detection and/or diagnosis of SARS-CoV-2 by FDA under an Emergency Use Authorization (EUA). This EUA will remain in effect (meaning this test can be used) for the duration of the COVID-19 declaration under Section 564(b)(1) of the Act, 21 U.S.C. section 360bbb-3(b)(1), unless the authorization is terminated or revoked.  Performed at [facility], [place] [address], [place], [place] [address]      Radiology Studies: US RENAL  Result Date: 07/31/2022 CLINICAL DATA:  Acute kidney injury. EXAM: RENAL / URINARY TRACT ULTRASOUND COMPLETE COMPARISON:  CT 02/17/2017 FINDINGS: Right Kidney: Renal measurements: 10.7 x 4.9 x 4.9 cm = volume: 139 mL. No hydronephrosis. Diffusely increased renal parenchymal echogenicity. No renal calculi or focal lesion. Left Kidney: Renal measurements: 9.7 x 5.9 x 5.4 cm = volume: 160 mL. No hydronephrosis. Diffusely increased renal parenchymal echogenicity. No renal calculi or focal lesion. Bladder: Appears normal for degree of bladder distention. Both ureteral jets are demonstrated. Other: None. IMPRESSION: 1. Mild increased renal parenchymal echogenicity typical of chronic medical renal disease. 2. No hydronephrosis or focal renal abnormality Electronically Signed   By: Keith Rake M.D.   On: 07/31/2022 14:44     Scheduled Meds:  amiodarone  150 mg Intravenous Once   apixaban  5 mg Oral BID   aspirin EC  81 mg Oral Daily   atorvastatin  80 mg Oral Daily   Chlorhexidine Gluconate Cloth  6 each Topical Daily   insulin aspart  0-15 Units Subcutaneous TID WC   insulin aspart  0-5 Units Subcutaneous QHS   insulin  glargine-yfgn  5 Units Subcutaneous Daily   nicotine  21 mg Transdermal Daily   polyethylene glycol  17 g Oral Daily   senna-docusate  1 tablet Oral BID   sodium chloride flush  10-40 mL Intracatheter Q12H   sodium chloride flush  3 mL Intravenous Q12H   sodium chloride flush  3 mL Intravenous Q12H   umeclidinium-vilanterol  1 puff Inhalation Daily   Continuous Infusions:  sodium chloride     sodium chloride Stopped (07/26/22 0932)   amiodarone     Followed by   amiodarone       LOS: 8 days    Time spent: 42mn    PDomenic Polite MD [facility]   08/02/2022, 11:54 AM

## 2022-08-02 NOTE — Progress Notes (Signed)
Physical Therapy Treatment Patient Details Name: [name] MRN: [id number] DOB: 03-01-1951 Today's Date: 08/02/2022   History of Present Illness Pt is a 71 y.o. female who presented 07/25/22 with cough, SOB, and lower extremity edema. Pt admitted with A/C HFrEF and new onset A flutter. Plan for [place] 08/03/22. PMH: COPD Gold stage II with chronic hypoxic respite failure on nocturnal nasal cannula, chronic combined HFpEF and HFrEF, LVEF 20-25%, status post AICD, IIDM, morbid obesity, cigarette smoker, HTN, HLD, lipoma    PT Comments    Patient progressing well towards PT goals. Session focused on progressive ambulation and endurance training. Improved ambulation distance with supervision and use of RW for support. No standing rest breaks needed today. Sp02 remained >93% on RA with activity, HR ranging from 110-143 bpm A-fib. Pt asymptomatic with elevated HR. Less SOB noted with activity. Eager to get home. Will plan for stair training next session as tolerated to prepare pt for d/c home. Will follow.    Recommendations for follow up therapy are one component of a multi-disciplinary discharge planning process, led by the attending physician.  Recommendations may be updated based on patient status, additional functional criteria and insurance authorization.  Follow Up Recommendations  No PT follow up     Assistance Recommended at Discharge PRN  Patient can return home with the following Assistance with cooking/housework;Help with stairs or ramp for entrance;Assist for transportation   Equipment Recommendations  Rolling walker (2 wheels)    Recommendations for Other Services       Precautions / Restrictions Precautions Precautions: Fall;Other (comment) Precaution Comments: watch HR Restrictions Weight Bearing Restrictions: No     Mobility  Bed Mobility               General bed mobility comments: Up in chair upon PT arrival.    Transfers Overall transfer level: Needs  assistance Equipment used: Rolling walker (2 wheels) Transfers: Sit to/from Stand Sit to Stand: Supervision           General transfer comment: Supervision for safety. Stood from Youth worker.    Ambulation/Gait Ambulation/Gait assistance: Supervision Gait Distance (Feet): 200 Feet Assistive device: Rolling walker (2 wheels) Gait Pattern/deviations: Step-through pattern, Decreased stride length, Wide base of support   Gait velocity interpretation: 1.31 - 2.62 ft/sec, indicative of limited community ambulator   General Gait Details: Slow, steady gait, pt able to take 1 hand off RW to fix hair scarf,  Sp02 93% on RA HR ranging from 110-143 bpm, A-fib.   Stairs             Wheelchair Mobility    Modified Rankin (Stroke Patients Only)       Balance Overall balance assessment: Needs assistance Sitting-balance support: No upper extremity supported, Feet supported Sitting balance-Leahy Scale: Good     Standing balance support: During functional activity Standing balance-Leahy Scale: Fair Standing balance comment: Static standing with min guard without device. Assist/device for any dynamic movement but only needs 1 UE support.                            Cognition Arousal/Alertness: Awake/alert Behavior During Therapy: WFL for tasks assessed/performed Overall Cognitive Status: Impaired/Different from baseline Area of Impairment: Memory                     Memory: Decreased short-term memory         General Comments: Does not recall things that happened  yesterday or what the plan is for today, "i don;t remember"        Exercises      General Comments General comments (skin integrity, edema, etc.): VSS on RA, Sp02 remained >93% on RA with activity, HR ranging from 110-143 bpm A-fib.      Pertinent Vitals/Pain Pain Assessment Pain Assessment: No/denies pain    Home Living                          Prior Function             PT Goals (current goals can now be found in the care plan section) Progress towards PT goals: Progressing toward goals    Frequency    Min 3X/week      PT Plan Current plan remains appropriate;Discharge plan needs to be updated    Co-evaluation              AM-PAC PT "6 Clicks" Mobility   Outcome Measure  Help needed turning from your back to your side while in a flat bed without using bedrails?: A Little Help needed moving from lying on your back to sitting on the side of a flat bed without using bedrails?: A Little Help needed moving to and from a bed to a chair (including a wheelchair)?: A Little Help needed standing up from a chair using your arms (e.g., wheelchair or bedside chair)?: A Little Help needed to walk in hospital room?: A Little Help needed climbing 3-5 steps with a railing? : A Little 6 Click Score: 18    End of Session Equipment Utilized During Treatment: Gait belt Activity Tolerance: Patient tolerated treatment well Patient left: in chair;with call bell/phone within reach Nurse Communication: Mobility status PT Visit Diagnosis: Unsteadiness on feet (R26.81);Other abnormalities of gait and mobility (R26.89);Muscle weakness (generalized) (M62.81);Difficulty in walking, not elsewhere classified (R26.2);History of falling (Z91.81)     Time: 5284-1324 PT Time Calculation (min) (ACUTE ONLY): 23 min  Charges:  $Gait Training: 8-22 mins $Therapeutic Exercise: 8-22 mins                     Marisa Severin, PT, DPT Acute Rehabilitation Services Secure chat preferred Office [place] 08/02/2022, 11:54 AM

## 2022-08-02 NOTE — H&P (View-Only) (Signed)
Advanced Heart Failure Rounding Note  PCP-Cardiologist: None   Subjective:   Admit weight 222--->194 today  Admitted with A/C HFrEF and new onset A flutter. Started on amio + heparin. Lasix gtt + DBA.   Echo LVEF 25-30%, D shaped LV, RV mildly dilated w/ mod elevated RVSP, RV systolic fx mildly reduced, severe BAE, severe TR, mild-mod MR, IVC dilated, assumed RAP 15    10/29 DBA cut back to 1 mcg and lasix drip stopped. Renal US no hydronephrosis.  10/30 DBA off, transitioned to PO amio  Back in atrial flutter this am  Sr Cr elevated, possibly from over diuresis.  Weight recorded as down 29 pounds total. CVP 6 today  No complaints.   Objective:   Weight Range: 88 kg Body mass index is 35.48 kg/m.   Vital Signs:   Temp:  [97.8 F (36.6 C)-98 F (36.7 C)] 98 F (36.7 C) (10/31 0740) Pulse Rate:  [77-85] 83 (10/31 0740) Resp:  [18-23] 20 (10/31 0740) BP: (121-129)/(54-61) 129/61 (10/31 0740) SpO2:  [91 %-99 %] 93 % (10/31 0742) Weight:  [88 kg] 88 kg (10/31 0624) Last BM Date : 07/29/22  Weight change: Filed Weights   07/31/22 0424 08/01/22 0402 08/02/22 0624  Weight: 90.3 kg 88.3 kg 88 kg    Intake/Output:   Intake/Output Summary (Last 24 hours) at 08/02/2022 0925 Last data filed at 08/02/2022 0623 Gross per 24 hour  Intake 236 ml  Output 500 ml  Net -264 ml   CVP 6   Physical Exam  General:  well appearing.  No respiratory difficulty HEENT: normal Neck: supple. JVD ~6 cm. Carotids 2+ bilat; no bruits. No lymphadenopathy or thyromegaly appreciated. Cor: PMI nondisplaced. Regularly irregular rate & rhythm. No rubs, gallops or murmurs. Lungs: clear Abdomen: soft, nontender, nondistended. No hepatosplenomegaly. No bruits or masses. Good bowel sounds. Extremities: no cyanosis, clubbing, rash, edema. +UNNA boots, PICC RUE Neuro: alert & oriented x 3, cranial nerves grossly intact. moves all 4 extremities w/o difficulty. Affect pleasant.   Telemetry    A flutter 90s (Personally reviewed)   Labs    CBC Recent Labs    07/31/22 0424 08/01/22 0358  WBC 7.5 8.9  HGB 10.4* 10.8*  HCT 32.8* 33.1*  MCV 81.6 80.9  PLT 227 712   Basic Metabolic Panel Recent Labs    08/01/22 0358 08/02/22 0242  NA 139 138  K 4.2 3.6  CL 92* 94*  CO2 35* 32  GLUCOSE 73 88  BUN 39* 39*  CREATININE 2.96* 3.15*  CALCIUM 8.9 8.6*   Liver Function Tests No results for input(s): "AST", "ALT", "ALKPHOS", "BILITOT", "PROT", "ALBUMIN" in the last 72 hours.  No results for input(s): "LIPASE", "AMYLASE" in the last 72 hours. Cardiac Enzymes No results for input(s): "CKTOTAL", "CKMB", "CKMBINDEX", "TROPONINI" in the last 72 hours.  BNP: BNP (last 3 results) Recent Labs    06/26/22 1445 07/13/22 1609 07/25/22 1321  BNP 418.1* 358.7* 461.6*    ProBNP (last 3 results) No results for input(s): "PROBNP" in the last 8760 hours.   D-Dimer No results for input(s): "DDIMER" in the last 72 hours. Hemoglobin A1C No results for input(s): "HGBA1C" in the last 72 hours. Fasting Lipid Panel No results for input(s): "CHOL", "HDL", "LDLCALC", "TRIG", "CHOLHDL", "LDLDIRECT" in the last 72 hours. Thyroid Function Tests No results for input(s): "TSH", "T4TOTAL", "T3FREE", "THYROIDAB" in the last 72 hours.  Invalid input(s): "FREET3"  Other results:   Imaging    No results  found.   Medications:     Scheduled Medications:  amiodarone  200 mg Oral BID   apixaban  5 mg Oral BID   aspirin EC  81 mg Oral Daily   atorvastatin  80 mg Oral Daily   Chlorhexidine Gluconate Cloth  6 each Topical Daily   insulin aspart  0-15 Units Subcutaneous TID WC   insulin aspart  0-5 Units Subcutaneous QHS   insulin glargine-yfgn  5 Units Subcutaneous Daily   nicotine  21 mg Transdermal Daily   polyethylene glycol  17 g Oral Daily   senna-docusate  1 tablet Oral BID   sodium chloride flush  10-40 mL Intracatheter Q12H   sodium chloride flush  3 mL Intravenous  Q12H   umeclidinium-vilanterol  1 puff Inhalation Daily    Infusions:  sodium chloride     sodium chloride Stopped (07/26/22 0932)    PRN Medications: sodium chloride, levalbuterol, ondansetron (ZOFRAN) IV, sodium chloride flush, sodium chloride flush    Patient Profile    [name] is a 71 year old with a history of DMII, COPD, HTN, chronic respiratory failure, tobacco abuse, [employer] ICD, and chronic HFrEF. EF has 25-30% for at least 5 years. Last LHC 2017, normal coronaries.    Presented with increased shortness of breath. New onset A flutter and A/C HFrEF with marked volume overload.    Assessment/Plan   1. Acute on Chronic Hypoxic Respiratory Failure --> Multifactorial AECOPD and A/C HFrEF.  - Started on solumedrol. + inhalers. -Chronically on 2-3 liters [place]. Placed on Bipap on arrival.  - Now on nasal cannula.  - SARS2 negative - CTA - negative PE.  - stable on 2 liters.    2. A/C HFrEF-->Cardiogenic Shock - NICM LHC 2017 normal cors. - 2022 Echo EF 25-30% which has been down for many years.  - s/p STJ ICD - Echo this admit LVEF 25-30%, D shaped LV, RV mildly dilated w/ mod elevated RVSP, RV systolic fx mildly reduced, severe BAE, severe TR, mild-mod MR, IVC dilated, assumed RAP 15  - Dobutamine stopped yesterday, coox stable at 70% today - CVP 6. Overall diuresed 29 pounds. Continue to hold diuretics. Told her she can drink a little extra today.  - Hold off GDMT with AKI and decompensation.  - Possible SGLT2i prior to d/c - c/w unna boots. - No MRI due to ICD, plan for RHC tomorrow morning, now off dobutamine   3. New Onset A flutter RVR - Of note had TEE 06/2022- no thrombus noted.  -Continue eliquis  - Back in Aflutter this am, will bolus 150 and restart amio gtt, transitioned to PO yesterday  4. AKI  -Creatinine baseline ~ 1.2  -On admit 1.7-->today 1.97 ->2.09->2.34 -> 2.5 -> 2.6->2.96->3.15 - Renal u/s- no hydronephrosis - Suspect over diuresed.   5. DMII,  uncontrolled  - On SSI  - Possible SGLT2i prior to d/c   6. Obesity   Continue to mobilize.   Length of Stay: [name] AGACNP-BC  08/02/2022, 9:25 AM  Advanced Heart Failure Team Pager [phone] (M-F; 7a - 5p)  Please contact [facility] for night-coverage after hours (5p -7a ) and weekends on [email or web address]  Patient seen and examined with the above-signed Advanced Practice Provider and/or Housestaff. I personally reviewed laboratory data, imaging studies and relevant notes. I independently examined the patient and formulated the important aspects of the plan. I have edited the note to reflect any of my changes or salient points. I  have personally discussed the plan with the patient and/or family.  Off lasix and DBA. SCr continues to rise. Back in AFL despite po amio.   Feels ok. Denies CP or SOB, CVP and co-ox ok   General:  Well appearing. No resp difficulty HEENT: normal Neck: supple. no JVD. Carotids 2+ bilat; no bruits. No lymphadenopathy or thryomegaly appreciated. Cor: PMI nondisplaced. Irregular rate & rhythm. No rubs, gallops or murmurs. Lungs: clear Abdomen: obese soft, nontender, nondistended. No hepatosplenomegaly. No bruits or masses. Good bowel sounds. Extremities: no cyanosis, clubbing, rash, edema Neuro: alert & orientedx3, cranial nerves grossly intact. moves all 4 extremities w/o difficulty. Affect pleasant  Has worsening AKI despite holding diuretics. Now back in AFL. Restart IV amio. Continue Eliquis. Plan RHC tomorrow.   Glori Bickers, MD  1:48 PM

## 2022-08-02 NOTE — Progress Notes (Addendum)
Advanced Heart Failure Rounding Note  PCP-Cardiologist: None   Subjective:   Admit weight 222--->194 today  Admitted with A/C HFrEF and new onset A flutter. Started on amio + heparin. Lasix gtt + DBA.   Echo LVEF 25-30%, D shaped LV, RV mildly dilated w/ mod elevated RVSP, RV systolic fx mildly reduced, severe BAE, severe TR, mild-mod MR, IVC dilated, assumed RAP 15    10/29 DBA cut back to 1 mcg and lasix drip stopped. Renal US no hydronephrosis.  10/30 DBA off, transitioned to PO amio  Back in atrial flutter this am  Sr Cr elevated, possibly from over diuresis.  Weight recorded as down 29 pounds total. CVP 6 today  No complaints.   Objective:   Weight Range: 88 kg Body mass index is 35.48 kg/m.   Vital Signs:   Temp:  [97.8 F (36.6 C)-98 F (36.7 C)] 98 F (36.7 C) (10/31 0740) Pulse Rate:  [77-85] 83 (10/31 0740) Resp:  [18-23] 20 (10/31 0740) BP: (121-129)/(54-61) 129/61 (10/31 0740) SpO2:  [91 %-99 %] 93 % (10/31 0742) Weight:  [88 kg] 88 kg (10/31 0624) Last BM Date : 07/29/22  Weight change: Filed Weights   07/31/22 0424 08/01/22 0402 08/02/22 0624  Weight: 90.3 kg 88.3 kg 88 kg    Intake/Output:   Intake/Output Summary (Last 24 hours) at 08/02/2022 0925 Last data filed at 08/02/2022 0623 Gross per 24 hour  Intake 236 ml  Output 500 ml  Net -264 ml   CVP 6   Physical Exam  General:  well appearing.  No respiratory difficulty HEENT: normal Neck: supple. JVD ~6 cm. Carotids 2+ bilat; no bruits. No lymphadenopathy or thyromegaly appreciated. Cor: PMI nondisplaced. Regularly irregular rate & rhythm. No rubs, gallops or murmurs. Lungs: clear Abdomen: soft, nontender, nondistended. No hepatosplenomegaly. No bruits or masses. Good bowel sounds. Extremities: no cyanosis, clubbing, rash, edema. +UNNA boots, PICC RUE Neuro: alert & oriented x 3, cranial nerves grossly intact. moves all 4 extremities w/o difficulty. Affect pleasant.   Telemetry    A flutter 90s (Personally reviewed)   Labs    CBC Recent Labs    07/31/22 0424 08/01/22 0358  WBC 7.5 8.9  HGB 10.4* 10.8*  HCT 32.8* 33.1*  MCV 81.6 80.9  PLT 227 854   Basic Metabolic Panel Recent Labs    08/01/22 0358 08/02/22 0242  NA 139 138  K 4.2 3.6  CL 92* 94*  CO2 35* 32  GLUCOSE 73 88  BUN 39* 39*  CREATININE 2.96* 3.15*  CALCIUM 8.9 8.6*   Liver Function Tests No results for input(s): "AST", "ALT", "ALKPHOS", "BILITOT", "PROT", "ALBUMIN" in the last 72 hours.  No results for input(s): "LIPASE", "AMYLASE" in the last 72 hours. Cardiac Enzymes No results for input(s): "CKTOTAL", "CKMB", "CKMBINDEX", "TROPONINI" in the last 72 hours.  BNP: BNP (last 3 results) Recent Labs    06/26/22 1445 07/13/22 1609 07/25/22 1321  BNP 418.1* 358.7* 461.6*    ProBNP (last 3 results) No results for input(s): "PROBNP" in the last 8760 hours.   D-Dimer No results for input(s): "DDIMER" in the last 72 hours. Hemoglobin A1C No results for input(s): "HGBA1C" in the last 72 hours. Fasting Lipid Panel No results for input(s): "CHOL", "HDL", "LDLCALC", "TRIG", "CHOLHDL", "LDLDIRECT" in the last 72 hours. Thyroid Function Tests No results for input(s): "TSH", "T4TOTAL", "T3FREE", "THYROIDAB" in the last 72 hours.  Invalid input(s): "FREET3"  Other results:   Imaging    No results  found.   Medications:     Scheduled Medications:  amiodarone  200 mg Oral BID   apixaban  5 mg Oral BID   aspirin EC  81 mg Oral Daily   atorvastatin  80 mg Oral Daily   Chlorhexidine Gluconate Cloth  6 each Topical Daily   insulin aspart  0-15 Units Subcutaneous TID WC   insulin aspart  0-5 Units Subcutaneous QHS   insulin glargine-yfgn  5 Units Subcutaneous Daily   nicotine  21 mg Transdermal Daily   polyethylene glycol  17 g Oral Daily   senna-docusate  1 tablet Oral BID   sodium chloride flush  10-40 mL Intracatheter Q12H   sodium chloride flush  3 mL Intravenous  Q12H   umeclidinium-vilanterol  1 puff Inhalation Daily    Infusions:  sodium chloride     sodium chloride Stopped (07/26/22 0932)    PRN Medications: sodium chloride, levalbuterol, ondansetron (ZOFRAN) IV, sodium chloride flush, sodium chloride flush    Patient Profile    [name] is a 71 year old with a history of DMII, COPD, HTN, chronic respiratory failure, tobacco abuse, St Jude ICD, and chronic HFrEF. EF has 25-30% for at least 5 years. Last LHC 2017, normal coronaries.    Presented with increased shortness of breath. New onset A flutter and A/C HFrEF with marked volume overload.    Assessment/Plan   1. Acute on Chronic Hypoxic Respiratory Failure --> Multifactorial AECOPD and A/C HFrEF.  - Started on solumedrol. + inhalers. -Chronically on 2-3 liters [place]. Placed on Bipap on arrival.  - Now on nasal cannula.  - SARS2 negative - CTA - negative PE.  - stable on 2 liters.    2. A/C HFrEF-->Cardiogenic Shock - NICM LHC 2017 normal cors. - 2022 Echo EF 25-30% which has been down for many years.  - s/p STJ ICD - Echo this admit LVEF 25-30%, D shaped LV, RV mildly dilated w/ mod elevated RVSP, RV systolic fx mildly reduced, severe BAE, severe TR, mild-mod MR, IVC dilated, assumed RAP 15  - Dobutamine stopped yesterday, coox stable at 70% today - CVP 6. Overall diuresed 29 pounds. Continue to hold diuretics. Told her she can drink a little extra today.  - Hold off GDMT with AKI and decompensation.  - Possible SGLT2i prior to d/c - c/w unna boots. - No MRI due to ICD, plan for RHC tomorrow morning, now off dobutamine   3. New Onset A flutter RVR - Of note had TEE 06/2022- no thrombus noted.  -Continue eliquis  - Back in Aflutter this am, will bolus 150 and restart amio gtt, transitioned to PO yesterday  4. AKI  -Creatinine baseline ~ 1.2  -On admit 1.7-->today 1.97 ->2.09->2.34 -> 2.5 -> 2.6->2.96->3.15 - Renal u/s- no hydronephrosis - Suspect over diuresed.   5. DMII,  uncontrolled  - On SSI  - Possible SGLT2i prior to d/c   6. Obesity   Continue to mobilize.   Length of Stay: [place] AGACNP-BC  08/02/2022, 9:25 AM  Advanced Heart Failure Team Pager [phone] (M-F; 7a - 5p)  Please contact [facility] for night-coverage after hours (5p -7a ) and weekends on [email or web address]  Patient seen and examined with the above-signed Advanced Practice Provider and/or Housestaff. I personally reviewed laboratory data, imaging studies and relevant notes. I independently examined the patient and formulated the important aspects of the plan. I have edited the note to reflect any of my changes or salient points. I  have personally discussed the plan with the patient and/or family.  Off lasix and DBA. SCr continues to rise. Back in AFL despite po amio.   Feels ok. Denies CP or SOB, CVP and co-ox ok   General:  Well appearing. No resp difficulty HEENT: normal Neck: supple. no JVD. Carotids 2+ bilat; no bruits. No lymphadenopathy or thryomegaly appreciated. Cor: PMI nondisplaced. Irregular rate & rhythm. No rubs, gallops or murmurs. Lungs: clear Abdomen: obese soft, nontender, nondistended. No hepatosplenomegaly. No bruits or masses. Good bowel sounds. Extremities: no cyanosis, clubbing, rash, edema Neuro: alert & orientedx3, cranial nerves grossly intact. moves all 4 extremities w/o difficulty. Affect pleasant  Has worsening AKI despite holding diuretics. Now back in AFL. Restart IV amio. Continue Eliquis. Plan RHC tomorrow.   Glori Bickers, MD  1:48 PM

## 2022-08-03 ENCOUNTER — Encounter (HOSPITAL_COMMUNITY)
Admission: EM | Disposition: A | Discharge status: Home / Self Care | Payer: Self-pay | Source: Home / Self Care | Attending: Internal Medicine

## 2022-08-03 ENCOUNTER — Encounter (HOSPITAL_COMMUNITY): Payer: Self-pay | Admitting: Internal Medicine

## 2022-08-03 DIAGNOSIS — N179 Acute kidney failure, unspecified: Secondary | ICD-10-CM | POA: Diagnosis not present

## 2022-08-03 DIAGNOSIS — I10 Essential (primary) hypertension: Secondary | ICD-10-CM | POA: Diagnosis not present

## 2022-08-03 DIAGNOSIS — E1169 Type 2 diabetes mellitus with other specified complication: Secondary | ICD-10-CM

## 2022-08-03 DIAGNOSIS — I5023 Acute on chronic systolic (congestive) heart failure: Secondary | ICD-10-CM | POA: Diagnosis not present

## 2022-08-03 DIAGNOSIS — I4892 Unspecified atrial flutter: Secondary | ICD-10-CM | POA: Diagnosis not present

## 2022-08-03 DIAGNOSIS — E785 Hyperlipidemia, unspecified: Secondary | ICD-10-CM

## 2022-08-03 HISTORY — PX: RIGHT HEART CATH: CATH118263

## 2022-08-03 LAB — GLUCOSE, CAPILLARY
Glucose-Capillary: 118 mg/dL — ABNORMAL HIGH (ref 70–99)
Glucose-Capillary: 194 mg/dL — ABNORMAL HIGH (ref 70–99)
Glucose-Capillary: 169 mg/dL — ABNORMAL HIGH (ref 70–99)
Glucose-Capillary: 153 mg/dL — ABNORMAL HIGH (ref 70–99)

## 2022-08-03 LAB — BASIC METABOLIC PANEL
Anion gap: 11 (ref 5–15)
BUN: 36 mg/dL — ABNORMAL HIGH (ref 8–23)
CO2: 33 mmol/L — ABNORMAL HIGH (ref 22–32)
Calcium: 8.5 mg/dL — ABNORMAL LOW (ref 8.9–10.3)
Chloride: 96 mmol/L — ABNORMAL LOW (ref 98–111)
Creatinine, Ser: 3.33 mg/dL — ABNORMAL HIGH (ref 0.44–1.00)
GFR, Estimated: 14 mL/min — ABNORMAL LOW (ref >60)
Glucose, Bld: 116 mg/dL — ABNORMAL HIGH (ref 70–99)
Potassium: 3.4 mmol/L — ABNORMAL LOW (ref 3.5–5.1)
Sodium: 140 mmol/L (ref 135–145)

## 2022-08-03 LAB — COOXEMETRY PANEL
Carboxyhemoglobin: 2.7 % — ABNORMAL HIGH (ref 0.5–1.5)
Methemoglobin: <0.7 % (ref 0.0–1.5)
O2 Saturation: 72.8 %
Total hemoglobin: 11.1 g/dL — ABNORMAL LOW (ref 12.0–16.0)

## 2022-08-03 SURGERY — RIGHT HEART CATH
Anesthesia: LOCAL

## 2022-08-03 MED ORDER — SODIUM CHLORIDE 0.9% FLUSH: 3.0000 mL | INTRAVENOUS | Status: DC | PRN

## 2022-08-03 MED ORDER — POTASSIUM CHLORIDE CRYS ER 20 MEQ PO TBCR
20.0000 meq | EXTENDED_RELEASE_TABLET | Freq: Once | ORAL | Status: AC
  Administered 2022-08-03: 20 meq via ORAL
  Filled 2022-08-03: qty 1

## 2022-08-03 MED ORDER — ONDANSETRON HCL 4 MG/2ML IJ SOLN: 4.0000 mg | Freq: Four times a day (QID) | INTRAMUSCULAR | Status: DC | PRN

## 2022-08-03 MED ORDER — HYDRALAZINE HCL 20 MG/ML IJ SOLN: 10.0000 mg | INTRAMUSCULAR | Status: AC | PRN

## 2022-08-03 MED ORDER — HEPARIN (PORCINE) IN NACL 1000-0.9 UT/500ML-% IV SOLN
INTRAVENOUS | Status: DC | PRN
  Administered 2022-08-03: 500 mL

## 2022-08-03 MED ORDER — LIDOCAINE HCL (PF) 1 % IJ SOLN
INTRAMUSCULAR | Status: DC | PRN
  Administered 2022-08-03: 5 mL

## 2022-08-03 MED ORDER — ACETAMINOPHEN 325 MG PO TABS: 650.0000 mg | ORAL_TABLET | ORAL | Status: DC | PRN

## 2022-08-03 MED ORDER — SODIUM CHLORIDE 0.9% FLUSH
3.0000 mL | Freq: Two times a day (BID) | INTRAVENOUS | Status: DC
  Administered 2022-08-03: 3 mL via INTRAVENOUS

## 2022-08-03 MED ORDER — LABETALOL HCL 5 MG/ML IV SOLN: 10.0000 mg | INTRAVENOUS | Status: AC | PRN

## 2022-08-03 MED ORDER — SODIUM CHLORIDE 0.9 % IV SOLN: 250.0000 mL | INTRAVENOUS | Status: DC | PRN

## 2022-08-03 MED ORDER — LIDOCAINE HCL (PF) 1 % IJ SOLN
INTRAMUSCULAR | Status: AC
  Filled 2022-08-03: qty 30

## 2022-08-03 MED ORDER — HEPARIN (PORCINE) IN NACL 1000-0.9 UT/500ML-% IV SOLN
INTRAVENOUS | Status: AC
  Filled 2022-08-03: qty 1000

## 2022-08-03 SURGICAL SUPPLY — 6 items
CATH SWAN GANZ 7F STRAIGHT (CATHETERS) IMPLANT
KIT MICROPUNCTURE NIT STIFF (SHEATH) IMPLANT
PACK CARDIAC CATHETERIZATION (CUSTOM PROCEDURE TRAY) ×1 IMPLANT
SHEATH PINNACLE 7F 10CM (SHEATH) IMPLANT
SHEATH PROBE COVER 6X72 (BAG) IMPLANT
TRANSDUCER W/STOPCOCK (MISCELLANEOUS) ×1 IMPLANT

## 2022-08-03 NOTE — Interval H&P Note (Signed)
History and Physical Interval Note:  08/03/2022 8:45 AM  [name]  has presented today for surgery, with the diagnosis of heart failure.  The various methods of treatment have been discussed with the patient and family. After consideration of risks, benefits and other options for treatment, the patient has consented to  Procedure(s): RIGHT HEART CATH (N/A) as a surgical intervention.  The patient's history has been reviewed, patient examined, no change in status, stable for surgery.  I have reviewed the patient's chart and labs.  Questions were answered to the patient's satisfaction.     [name]

## 2022-08-03 NOTE — Progress Notes (Signed)
On PICC line dressing change induration noted above insertion site.  With small amount of pressure cream pus came from insertion site.  Patient denies pain.  Site cleaned very well and bio patch applied.   Richardean Canal RN, BSN, CCRN-K

## 2022-08-03 NOTE — Plan of Care (Signed)
  Problem: Education: Goal: Ability to describe self-care measures that may prevent or decrease complications (Diabetes Survival Skills Education) will improve Outcome: Progressing Goal: Individualized Educational Video(s) Outcome: Progressing   Problem: Coping: Goal: Ability to adjust to condition or change in health will improve Outcome: Progressing   Problem: Fluid Volume: Goal: Ability to maintain a balanced intake and output will improve Outcome: Progressing   Problem: Health Behavior/Discharge Planning: Goal: Ability to identify and utilize available resources and services will improve Outcome: Progressing Goal: Ability to manage health-related needs will improve Outcome: Progressing   Problem: Metabolic: Goal: Ability to maintain appropriate glucose levels will improve Outcome: Progressing   Problem: Nutritional: Goal: Maintenance of adequate nutrition will improve Outcome: Progressing Goal: Progress toward achieving an optimal weight will improve Outcome: Progressing   Problem: Skin Integrity: Goal: Risk for impaired skin integrity will decrease Outcome: Progressing   Problem: Tissue Perfusion: Goal: Adequacy of tissue perfusion will improve Outcome: Progressing   Problem: Education: Goal: Knowledge of General Education information will improve Description: Including pain rating scale, medication(s)/side effects and non-pharmacologic comfort measures Outcome: Progressing   Problem: Health Behavior/Discharge Planning: Goal: Ability to manage health-related needs will improve Outcome: Progressing   Problem: Clinical Measurements: Goal: Ability to maintain clinical measurements within normal limits will improve Outcome: Progressing Goal: Will remain free from infection Outcome: Progressing Goal: Diagnostic test results will improve Outcome: Progressing Goal: Respiratory complications will improve Outcome: Progressing Goal: Cardiovascular complication will  be avoided Outcome: Progressing   Problem: Activity: Goal: Risk for activity intolerance will decrease Outcome: Progressing   Problem: Nutrition: Goal: Adequate nutrition will be maintained Outcome: Progressing   Problem: Coping: Goal: Level of anxiety will decrease Outcome: Progressing   Problem: Elimination: Goal: Will not experience complications related to bowel motility Outcome: Progressing Goal: Will not experience complications related to urinary retention Outcome: Progressing   Problem: Pain Managment: Goal: General experience of comfort will improve Outcome: Progressing   Problem: Safety: Goal: Ability to remain free from injury will improve Outcome: Progressing   Problem: Skin Integrity: Goal: Risk for impaired skin integrity will decrease Outcome: Progressing   Problem: Education: Goal: Ability to demonstrate management of disease process will improve Outcome: Progressing Goal: Ability to verbalize understanding of medication therapies will improve Outcome: Progressing Goal: Individualized Educational Video(s) Outcome: Progressing   Problem: Activity: Goal: Capacity to carry out activities will improve Outcome: Progressing   Problem: Cardiac: Goal: Ability to achieve and maintain adequate cardiopulmonary perfusion will improve Outcome: Progressing   Problem: Education: Goal: Understanding of CV disease, CV risk reduction, and recovery process will improve Outcome: Progressing Goal: Individualized Educational Video(s) Outcome: Progressing   Problem: Activity: Goal: Ability to return to baseline activity level will improve Outcome: Progressing   Problem: Cardiovascular: Goal: Ability to achieve and maintain adequate cardiovascular perfusion will improve Outcome: Progressing Goal: Vascular access site(s) Level 0-1 will be maintained Outcome: Progressing   Problem: Health Behavior/Discharge Planning: Goal: Ability to safely manage  health-related needs after discharge will improve Outcome: Progressing

## 2022-08-03 NOTE — Progress Notes (Signed)
Mobility Specialist - Progress Note   08/03/22 1528  Mobility  Activity Ambulated with assistance in hallway  Level of Assistance Standby assist, set-up cues, supervision of patient - no hands on  Assistive Device Front wheel walker  Distance Ambulated (ft) 400 ft  Activity Response Tolerated well  Mobility Referral Yes  $Mobility charge 1 Mobility   Pt received in chair and agreeable to mobility. No complaints throughout. Pt returned to chair with all needs met.   Larey Seat

## 2022-08-03 NOTE — Progress Notes (Addendum)
Advanced Heart Failure Rounding Note  PCP-Cardiologist: None   Subjective:   Admit weight 222--->194 today  Admitted with A/C HFrEF and new onset A flutter. Started on amio + heparin. Lasix gtt + DBA.   Echo LVEF 25-30%, D shaped LV, RV mildly dilated w/ mod elevated RVSP, RV systolic fx mildly reduced, severe BAE, severe TR, mild-mod MR, IVC dilated, assumed RAP 15    10/29 DBA cut back to 1 mcg and lasix drip stopped. Renal US no hydronephrosis.  10/30 DBA off, transitioned to PO amio 10/31 Back in A flutter. Restarted on amio drip.Converted to NSR later that day.    CO-OX stable 73%.   Denies SOB.   Objective:   Weight Range: 88 kg Body mass index is 35.48 kg/m.   Vital Signs:   Temp:  [97.4 F (36.3 C)-97.5 F (36.4 C)] 97.5 F (36.4 C) (11/01 0405) Pulse Rate:  [80] 80 (10/31 1929) Resp:  [13-20] 20 (11/01 0405) BP: (110-124)/(54-79) 110/54 (11/01 0405) SpO2:  [94 %] 94 % (10/31 1929) Last BM Date : 08/02/22  Weight change: Filed Weights   07/31/22 0424 08/01/22 0402 08/02/22 0624  Weight: 90.3 kg 88.3 kg 88 kg    Intake/Output:   Intake/Output Summary (Last 24 hours) at 08/03/2022 0808 Last data filed at 08/03/2022 0500 Gross per 24 hour  Intake 890.36 ml  Output --  Net 890.36 ml    Physical Exam  General:   No resp difficulty HEENT: normal Neck: supple. no JVD. Carotids 2+ bilat; no bruits. No lymphadenopathy or thryomegaly appreciated. Cor: PMI nondisplaced. Regular rate & rhythm. No rubs, gallops or murmurs. Lungs: clear Abdomen: soft, nontender, nondistended. No hepatosplenomegaly. No bruits or masses. Good bowel sounds. Extremities: no cyanosis, clubbing, rash, edema. Bilateral unna boots. RUE PICC Neuro: alert & orientedx3, cranial nerves grossly intact. moves all 4 extremities w/o difficulty. Affect pleasant  Telemetry  SR 80s converted back to SR yesterd.   Labs    CBC Recent Labs    08/01/22 0358  WBC 8.9  HGB 10.8*  HCT  33.1*  MCV 80.9  PLT 301   Basic Metabolic Panel Recent Labs    08/02/22 0242 08/03/22 0415  NA 138 140  K 3.6 3.4*  CL 94* 96*  CO2 32 33*  GLUCOSE 88 116*  BUN 39* 36*  CREATININE 3.15* 3.33*  CALCIUM 8.6* 8.5*   Liver Function Tests No results for input(s): "AST", "ALT", "ALKPHOS", "BILITOT", "PROT", "ALBUMIN" in the last 72 hours.  No results for input(s): "LIPASE", "AMYLASE" in the last 72 hours. Cardiac Enzymes No results for input(s): "CKTOTAL", "CKMB", "CKMBINDEX", "TROPONINI" in the last 72 hours.  BNP: BNP (last 3 results) Recent Labs    06/26/22 1445 07/13/22 1609 07/25/22 1321  BNP 418.1* 358.7* 461.6*    ProBNP (last 3 results) No results for input(s): "PROBNP" in the last 8760 hours.   D-Dimer No results for input(s): "DDIMER" in the last 72 hours. Hemoglobin A1C No results for input(s): "HGBA1C" in the last 72 hours. Fasting Lipid Panel No results for input(s): "CHOL", "HDL", "LDLCALC", "TRIG", "CHOLHDL", "LDLDIRECT" in the last 72 hours. Thyroid Function Tests No results for input(s): "TSH", "T4TOTAL", "T3FREE", "THYROIDAB" in the last 72 hours.  Invalid input(s): "FREET3"  Other results:   Imaging    No results found.   Medications:     Scheduled Medications:  angioplasty book   Does not apply Once   apixaban  5 mg Oral BID   aspirin  EC  81 mg Oral Daily   atorvastatin  80 mg Oral Daily   Chlorhexidine Gluconate Cloth  6 each Topical Daily   insulin aspart  0-15 Units Subcutaneous TID WC   insulin aspart  0-5 Units Subcutaneous QHS   insulin glargine-yfgn  5 Units Subcutaneous Daily   nicotine  21 mg Transdermal Daily   polyethylene glycol  17 g Oral Daily   senna-docusate  1 tablet Oral BID   sodium chloride flush  10-40 mL Intracatheter Q12H   sodium chloride flush  3 mL Intravenous Q12H   sodium chloride flush  3 mL Intravenous Q12H   umeclidinium-vilanterol  1 puff Inhalation Daily    Infusions:  sodium chloride      sodium chloride Stopped (07/26/22 0932)   sodium chloride     sodium chloride 10 mL/hr at 08/03/22 0528   amiodarone 30 mg/hr (08/02/22 2311)    PRN Medications: sodium chloride, sodium chloride, levalbuterol, ondansetron (ZOFRAN) IV, sodium chloride flush, sodium chloride flush, sodium chloride flush    Patient Profile    [name] is a 71 year old with a history of DMII, COPD, HTN, chronic respiratory failure, tobacco abuse, St Jude ICD, and chronic HFrEF. EF has 25-30% for at least 5 years. Last LHC 2017, normal coronaries.    Presented with increased shortness of breath. New onset A flutter and A/C HFrEF with marked volume overload.    Assessment/Plan   1. Acute on Chronic Hypoxic Respiratory Failure --> Multifactorial AECOPD and A/C HFrEF.  - Started on solumedrol. + inhalers. -Chronically on 2-3 liters [place]. Placed on Bipap on arrival.  - Now on nasal cannula.  - SARS2 negative - CTA - negative PE.  - stable on 2 liters.    2. A/C HFrEF-->Cardiogenic Shock - NICM LHC 2017 normal cors. - 2022 Echo EF 25-30% which has been down for many years.  - s/p STJ ICD - Echo this admit LVEF 25-30%, D shaped LV, RV mildly dilated w/ mod elevated RVSP, RV systolic fx mildly reduced, severe BAE, severe TR, mild-mod MR, IVC dilated, assumed RAP 15  - Dobutamine stopped yesterday, coox remains stable.  Overall diuresed 29 pounds. Continue to hold diuretics. RHC today and adjust diuretics as indicated.   - Hold off GDMT with AKI and decompensation.  - Possible SGLT2i prior to d/c - c/w unna boots. - No MRI due to ICD, plan for RHC tomorrow morning, now off dobutamine   3. New Onset A flutter RVR - Of note had TEE 06/2022- no thrombus noted.  - Chemically converted with IV amio.  - Plan to continue IV amio for now.  -Continue eliquis  - 4. AKI  -Creatinine baseline ~ 1.2  -On admit 1.7-->today 3.15 -> 3.33 - Renal u/s- no hydronephrosis - Suspect over diuresed.   5. DMII,  uncontrolled  - On SSI  - Possible SGLT2i prior to d/c   6. Obesity   RHC this morning.    Length of Stay: [place] AGACNP-BC  08/03/2022, 8:08 AM  Advanced Heart Failure Team Pager [phone] (M-F; 7a - 5p)  Please contact [facility] for night-coverage after hours (5p -7a ) and weekends on [email or web address]  Patient seen and examined with the above-signed Advanced Practice Provider and/or Housestaff. I personally reviewed laboratory data, imaging studies and relevant notes. I independently examined the patient and formulated the important aspects of the plan. I have edited the note to reflect any of my changes or salient points. I  have personally discussed the plan with the patient and/or family.  Feeling better. Denies CP or SOB. Back in NSR on IV amio. RHC today with relatively well-compensated hemodynamics.   General:  Well appearing. No resp difficulty HEENT: normal Neck: supple. JVP 8-9 Carotids 2+ bilat; no bruits. No lymphadenopathy or thryomegaly appreciated. Cor: PMI nondisplaced. Regular rate & rhythm. No rubs, gallops or murmurs. Lungs: clear Abdomen: obese soft, nontender, nondistended. No hepatosplenomegaly. No bruits or masses. Good bowel sounds. Extremities: no cyanosis, clubbing, rash, edema Neuro: alert & orientedx3, cranial nerves grossly intact. moves all 4 extremities w/o difficulty. Affect pleasant  Hemodynamics look good. Scr starting to plateau. Back in NSR on IV amio. Hold diuretics one more day. Begin po torsemide tomorrow. Continue IV amio today. Follow renal function.   Glori Bickers, MD  11:51 AM

## 2022-08-03 NOTE — Progress Notes (Addendum)
Progress Note   Patient: [name] ZHG:[id number] DOB: 12/18/1950 DOA: 07/25/2022     9 DOS: the patient was seen and examined on 08/03/2022   Brief hospital course: [name] was admitted to the hospital with the working diagnosis of decompensated heart failure.   71 yo female with the past medical history of COPD, heart failure, T2DM, and obesity who presented with dyspnea, wheezing and edema. Recent treatment for cellulitis with antibiotic therapy for 4 weeks, In the ED she was found in respiratory distress and was placed on Bipap. Blood pressure 139/96, HR 124, RR 27 and 02 saturation 100%, lungs with diffuse wheezing and rales bilaterally with increased work of breathing, heart with S1 and S2 present and irregularly irregular, abdomen with no distention, positive lower extremity edema.   Na 138, K 4,8 Cl 99, bicarbonate 30 glucose 311 bun 34 cr 1,74  BNP 461  High sensitive troponin 49 and 48  Lactic acid 2,9  Wbc 8,5 hgb 12.5 plt 275  Sars covid 19 negative   Chest radiograph with cardiomegaly with mild hilar vascular congestion, fluid in the right fissure, pacer defibrillator in place.   EKG 141 bpm, normal axis, qtc 566, atrial flutter rhythm with variable block, low voltage with no significant ST segment or T wave changes.   Placed on amiodarone for atrial flutter rate and rhythm control.  Patient developed low output heart failure, and required dobutamine infusion started on 10/24. 10/25 furosemide drip.  10/30 discontinued dobutamine.        Assessment and Plan: * Acute on chronic systolic CHF (congestive heart failure) (HCC) Echocardiogram with reduced LV systolic function EF 25 to 30%, septal dyskinesis, RV with mild reduction in systolic function, moderate dilatation of RA, moderate TR.   Urine output not documented Balance is negative 11,305 ml since admission.  Systolic blood pressure 196 to 126 mmHg,   11/01 cardiac catheterization  RA 9 RV 55/10 PA  44/11 (mean 23) PCWP 9 Cardiac output 6,1 and injdex 3,2 (fick) PVR 2,3   Diuresis on hold, and inotropic has been weaned off. Limited pharmacologic options due to risk of hypotension and worsening renal failure.   Atrial flutter with rapid ventricular response ([name]) Patient with RVR, low EF with hypotension  Continue rate control with amiodarone. Continue with IV load, change to oral per protocol.  Continue anticoagulation with apixaban,    Acute kidney injury superimposed on chronic kidney disease (HCC) CKD stage 3a, hypokalemia.   Renal function with serum cr at 3,3 with K at 3,4 and serum bicarbonate at 33. Plan to continue to hold diuretic therapy and follow up renal function in am.    Essential hypertension Patient is now off inotropic support Continue close blood pressure monitoring.   Acute metabolic encephalopathy Encephalopathy due to cardiogenic shock, it has resolved, patient is awake and alert.   COPD (chronic obstructive pulmonary disease) (HCC) No clinical signs of exacerbation, continue oxymetry monitoring.   Type 2 diabetes mellitus with hyperlipidemia (HCC) Fasting glucose this am 116, Plan to continue close glucose cover and monitoring with insulin sliding scale.    Obesity (BMI 30-39.9) Calculate BMI 35, 4        Subjective: Patient is feeling better, she is seating at the side of the bed in the chair   Physical Exam: Vitals:   08/03/22 0858 08/03/22 0903 08/03/22 0938 08/03/22 1054  BP: 134/72 136/70 (!) 141/73 109/69  Pulse: 78 79 78 80  Resp: (!) 24 (!) 21 14  Temp:   97.8 F (36.6 C)   TempSrc:   Oral   SpO2: 96% 97% 96% 95%  Weight:      Height:       Neurology awake and alert ENT with mild pallor Cardiovascular with S1 and S2 present and rhythmic with no gallops, rubs or murmurs Respiratory with no rales or wheezing Abdomen with no distention  Positive lower extremity edema ++ unna boots in place  Data  Reviewed:    Family Communication: friends at the bedside   Disposition: Status is: Inpatient Remains inpatient appropriate because: heart failure   Planned Discharge Destination: Home     Author: Tawni Millers, MD 08/03/2022 3:23 PM  For on call review [email or web address].

## 2022-08-04 ENCOUNTER — Other Ambulatory Visit (HOSPITAL_COMMUNITY): Payer: Self-pay

## 2022-08-04 DIAGNOSIS — J440 Chronic obstructive pulmonary disease with acute lower respiratory infection: Secondary | ICD-10-CM

## 2022-08-04 LAB — BASIC METABOLIC PANEL
Anion gap: 10 (ref 5–15)
BUN: 35 mg/dL — ABNORMAL HIGH (ref 8–23)
CO2: 29 mmol/L (ref 22–32)
Calcium: 8.3 mg/dL — ABNORMAL LOW (ref 8.9–10.3)
Chloride: 97 mmol/L — ABNORMAL LOW (ref 98–111)
Creatinine, Ser: 3 mg/dL — ABNORMAL HIGH (ref 0.44–1.00)
GFR, Estimated: 16 mL/min — ABNORMAL LOW (ref >60)
Glucose, Bld: 183 mg/dL — ABNORMAL HIGH (ref 70–99)
Potassium: 3.3 mmol/L — ABNORMAL LOW (ref 3.5–5.1)
Sodium: 136 mmol/L (ref 135–145)

## 2022-08-04 LAB — POCT I-STAT EG7
Acid-Base Excess: 9 mmol/L — ABNORMAL HIGH (ref 0.0–2.0)
Acid-Base Excess: 9 mmol/L — ABNORMAL HIGH (ref 0.0–2.0)
Bicarbonate: 34.8 mmol/L — ABNORMAL HIGH (ref 20.0–28.0)
Bicarbonate: 34.8 mmol/L — ABNORMAL HIGH (ref 20.0–28.0)
Calcium, Ion: 1.09 mmol/L — ABNORMAL LOW (ref 1.15–1.40)
Calcium, Ion: 1.11 mmol/L — ABNORMAL LOW (ref 1.15–1.40)
HCT: 36 % (ref 36.0–46.0)
HCT: 36 % (ref 36.0–46.0)
Hemoglobin: 12.2 g/dL (ref 12.0–15.0)
Hemoglobin: 12.2 g/dL (ref 12.0–15.0)
O2 Saturation: 67 %
O2 Saturation: 67 %
Potassium: 3 mmol/L — ABNORMAL LOW (ref 3.5–5.1)
Potassium: 3.1 mmol/L — ABNORMAL LOW (ref 3.5–5.1)
Sodium: 139 mmol/L (ref 135–145)
Sodium: 140 mmol/L (ref 135–145)
TCO2: 36 mmol/L — ABNORMAL HIGH (ref 22–32)
TCO2: 36 mmol/L — ABNORMAL HIGH (ref 22–32)
pCO2, Ven: 52.6 mmHg (ref 44–60)
pCO2, Ven: 52.7 mmHg (ref 44–60)
pH, Ven: 7.428 (ref 7.25–7.43)
pH, Ven: 7.428 (ref 7.25–7.43)
pO2, Ven: 35 mmHg (ref 32–45)
pO2, Ven: 35 mmHg (ref 32–45)

## 2022-08-04 LAB — GLUCOSE, CAPILLARY
Glucose-Capillary: 148 mg/dL — ABNORMAL HIGH (ref 70–99)
Glucose-Capillary: 174 mg/dL — ABNORMAL HIGH (ref 70–99)

## 2022-08-04 MED ORDER — TORSEMIDE 20 MG PO TABS
80.0000 mg | ORAL_TABLET | Freq: Every morning | ORAL | Status: DC
  Administered 2022-08-04: 80 mg via ORAL
  Filled 2022-08-04: qty 4

## 2022-08-04 MED ORDER — TORSEMIDE 20 MG PO TABS: 40.0000 mg | ORAL_TABLET | Freq: Every evening | ORAL | Status: DC

## 2022-08-04 MED ORDER — TORSEMIDE 40 MG PO TABS
40.0000 mg | ORAL_TABLET | Freq: Every evening | ORAL | 0 refills | Status: DC
  Filled 2022-08-04: qty 30, 30d supply, fill #0

## 2022-08-04 MED ORDER — TORSEMIDE 20 MG PO TABS
80.0000 mg | ORAL_TABLET | Freq: Every morning | ORAL | 0 refills | Status: DC
  Filled 2022-08-04: qty 60, 30d supply, fill #0

## 2022-08-04 MED ORDER — AMIODARONE HCL 200 MG PO TABS
200.0000 mg | ORAL_TABLET | Freq: Two times a day (BID) | ORAL | 0 refills | Status: DC
  Filled 2022-08-04: qty 60, 30d supply, fill #0

## 2022-08-04 MED ORDER — APIXABAN 5 MG PO TABS
5.0000 mg | ORAL_TABLET | Freq: Two times a day (BID) | ORAL | 0 refills | Status: DC
  Filled 2022-08-04: qty 60, 30d supply, fill #0

## 2022-08-04 MED ORDER — AMIODARONE HCL 200 MG PO TABS
200.0000 mg | ORAL_TABLET | Freq: Two times a day (BID) | ORAL | Status: DC
  Administered 2022-08-04: 200 mg via ORAL
  Filled 2022-08-04: qty 1

## 2022-08-04 MED ORDER — POTASSIUM CHLORIDE CRYS ER 20 MEQ PO TBCR
40.0000 meq | EXTENDED_RELEASE_TABLET | Freq: Once | ORAL | Status: AC
  Administered 2022-08-04: 40 meq via ORAL
  Filled 2022-08-04: qty 2

## 2022-08-04 NOTE — Progress Notes (Signed)
Progress Note   Patient: [name] JEH:[id number] DOB: August 05, 1951 DOA: 07/25/2022     10 DOS: the patient was seen and examined on 08/04/2022   Brief hospital course: Mrs. Vanzile was admitted to the hospital with the working diagnosis of decompensated heart failure.   71 yo female with the past medical history of COPD, heart failure, T2DM, and obesity who presented with dyspnea, wheezing and edema. Recent treatment for cellulitis with antibiotic therapy for 4 weeks, In the ED she was found in respiratory distress and was placed on Bipap. Blood pressure 139/96, HR 124, RR 27 and 02 saturation 100%, lungs with diffuse wheezing and rales bilaterally with increased work of breathing, heart with S1 and S2 present and irregularly irregular, abdomen with no distention, positive lower extremity edema.   Na 138, K 4,8 Cl 99, bicarbonate 30 glucose 311 bun 34 cr 1,74  BNP 461  High sensitive troponin 49 and 48  Lactic acid 2,9  Wbc 8,5 hgb 12.5 plt 275  Sars covid 19 negative   Chest radiograph with cardiomegaly with mild hilar vascular congestion, fluid in the right fissure, pacer defibrillator in place.   EKG 141 bpm, normal axis, qtc 566, atrial flutter rhythm with variable block, low voltage with no significant ST segment or T wave changes.   Placed on amiodarone for atrial flutter rate and rhythm control.  Patient developed low output heart failure, and required dobutamine infusion started on 10/24. 10/25 furosemide drip.  10/30 discontinued dobutamine.  11/02 transitioned to oral amiodarone        Assessment and Plan: * Acute on chronic systolic CHF (congestive heart failure) (HCC) Echocardiogram with reduced LV systolic function EF 25 to 30%, septal dyskinesis, RV with mild reduction in systolic function, moderate dilatation of RA, moderate TR.   Urine output not documented Balance is negative 11,305 ml since admission.  Systolic blood pressure 378 to 144 mmHg,   11/01 cardiac  catheterization  RA 9 RV 55/10 PA 44/11 (mean 23) PCWP 9 Cardiac output 6,1 and injdex 3,2 (fick) PVR 2,3   Limited pharmacologic options due to risk of hypotension and worsening renal failure.  Resume torsemide 80 mg in am and 40 mg in pm.   Atrial flutter with rapid ventricular response (HCC) Patient with RVR, low EF with hypotension  Patient has been changed to oral amiodarone  Continue anticoagulation with apixaban,    Acute kidney injury superimposed on chronic kidney disease (HCC) CKD stage 3a, hypokalemia.   Renal function with serum cr at 3,0, K 3,3 with serum bicarbonate 29.  Plan to follow on on renal function in am, continue K correction,    Essential hypertension Patient is now off inotropic support Continue close blood pressure monitoring.   Acute metabolic encephalopathy Encephalopathy due to cardiogenic shock, it has resolved, patient is awake and alert.   COPD (chronic obstructive pulmonary disease) (HCC) No clinical signs of exacerbation, continue oxymetry monitoring.   Type 2 diabetes mellitus with hyperlipidemia (HCC) Fasting glucose this am 116, Plan to continue close glucose cover and monitoring with insulin sliding scale.    Obesity (BMI 30-39.9) Calculate BMI 35, 4        Subjective: Patient is seating in the chair at the side of the bed, dyspnea has improved, no chest pain,   Physical Exam: Vitals:   08/03/22 1054 08/03/22 1952 08/04/22 0423 08/04/22 0809  BP: 109/69 (!) 147/77 (!) 144/68 139/69  Pulse: 80 87 88 80  Resp:  17  18  Temp:  (!) 97.5 F (36.4 C) 97.7 F (36.5 C) 97.8 F (36.6 C)  TempSrc:  Oral Oral Oral  SpO2: 95% 97%    Weight:   89.4 kg   Height:       Neurology awake and alert ENT with mild pallor Cardiovascular with S1 and S2 present and rhythmic with no gallops, positive systolic murmur at the apex Respiratory with no rales or wheezing Abdomen with no distention  Trace lower extremity edema, unna boots in  place   Data Reviewed:    Family Communication: no family at the bedside   Disposition: Status is: Inpatient Remains inpatient appropriate because: heart failure   Planned Discharge Destination: Home    Author: Tawni Millers, MD 08/04/2022 12:09 PM  For on call review [email or web address].

## 2022-08-04 NOTE — Discharge Instructions (Signed)

## 2022-08-04 NOTE — Discharge Summary (Addendum)
Physician Discharge Summary   Patient: [name] MRN: [id number] DOB: 16-Mar-1951  Admit date:     07/25/2022  Discharge date: 08/04/22  Discharge Physician: Jimmy Picket Misheel Gowans   PCP: [name], MD   Recommendations at discharge:    Patient will continue diuresis with torsemide 80 mg in am and 40 mg in pm. Continue amiodarone for atrial fibrillation Follow up with Dr Alton Revere in 7 to 10 days Follow up renal function in 7 days Follow up with Cardiology as scheduled  Patient has a thyroid biopsy scheduled for this Monday, if anticoagulation needs to be held, the procedure needs to be postponed for at least 4 weeks, because patient has recently converted to sinus rhythm and is not recommended to interrupt anticoagulation.   Discharge Diagnoses: Principal Problem:   Acute on chronic systolic CHF (congestive heart failure) (HCC) Active Problems:   Atrial flutter with rapid ventricular response (HCC)   Acute kidney injury superimposed on chronic kidney disease ([place])   Essential hypertension   Acute metabolic encephalopathy   COPD (chronic obstructive pulmonary disease) (HCC)   Type 2 diabetes mellitus with hyperlipidemia (HCC)   Obesity (BMI 30-39.9)  Resolved Problems:   * No resolved hospital problems. [facility] Course: [name] was admitted to the hospital with the working diagnosis of decompensated heart failure.   71 yo female with the past medical history of COPD, heart failure, T2DM, and obesity who presented with dyspnea, wheezing and edema. Recent treatment for cellulitis with antibiotic therapy for 4 weeks, In the ED she was found in respiratory distress and was placed on Bipap. Blood pressure 139/96, HR 124, RR 27 and 02 saturation 100%, lungs with diffuse wheezing and rales bilaterally with increased work of breathing, heart with S1 and S2 present and irregularly irregular, abdomen with no distention, positive lower extremity edema.   Na 138, K 4,8 Cl 99,  bicarbonate 30 glucose 311 bun 34 cr 1,74  BNP 461  High sensitive troponin 49 and 48  Lactic acid 2,9  Wbc 8,5 hgb 12.5 plt 275  Sars covid 19 negative   Chest radiograph with cardiomegaly with mild hilar vascular congestion, fluid in the right fissure, pacer defibrillator in place.   EKG 141 bpm, normal axis, qtc 566, atrial flutter rhythm with variable block, low voltage with no significant ST segment or T wave changes.   Placed on amiodarone for atrial flutter rate and rhythm control.  Patient developed low output heart failure, and required dobutamine infusion started on 10/24. 10/25 furosemide drip.  10/30 discontinued dobutamine.  11/02 transitioned to oral amiodarone        Assessment and Plan: * Acute on chronic systolic CHF (congestive heart failure) (HCC) Echocardiogram with reduced LV systolic function EF 25 to 30%, septal dyskinesis, RV with mild reduction in systolic function, moderate dilatation of RA, moderate TR.   Urine output not documented Balance is negative 11,305 ml since admission.  Systolic blood pressure 081 to 144 mmHg,   11/01 cardiac catheterization  RA 9 RV 55/10 PA 44/11 (mean 23) PCWP 9 Cardiac output 6,1 and injdex 3,2 (fick) PVR 2,3   Limited pharmacologic options due to risk of hypotension and worsening renal failure.  Resume torsemide 80 mg in am and 40 mg in pm.   Atrial flutter with rapid ventricular response (HCC) Patient with RVR, low EF with hypotension  Patient has been changed to oral amiodarone  Continue anticoagulation with apixaban,    Acute kidney injury superimposed on chronic  kidney disease (HCC) CKD stage 3a, hypokalemia.   Renal function with serum cr at 3,0, K 3,3 with serum bicarbonate 29.  Plan to follow on on renal function in am, continue K correction,    Essential hypertension Patient is now off inotropic support Continue close blood pressure monitoring.   Acute metabolic encephalopathy Encephalopathy  due to cardiogenic shock, it has resolved, patient is awake and alert.   COPD (chronic obstructive pulmonary disease) (HCC) No clinical signs of exacerbation, continue oxymetry monitoring.   Type 2 diabetes mellitus with hyperlipidemia (HCC) Fasting glucose this am 116, Plan to continue close glucose cover and monitoring with insulin sliding scale.    Obesity (BMI 30-39.9) Calculate BMI 35, 4         Consultants: cardiology  Procedures performed: cardiac catheterization   Disposition: Home Diet recommendation:  Cardiac and Carb modified diet DISCHARGE MEDICATION: Allergies as of 08/04/2022       Reactions   Actos [pioglitazone] Other (See Comments)   congestive heart failure   Naproxen Other (See Comments)   '500mg'$  dose made her sleep for two days   Rosiglitazone Hives   Tomato Itching   Hydrocodone-acetaminophen Itching   Hydrocortisone Hives, Itching        Medication List     STOP taking these medications    amoxicillin 500 MG capsule Commonly known as: AMOXIL   dapagliflozin propanediol 10 MG Tabs tablet Commonly known as: Farxiga   metoprolol succinate 50 MG 24 hr tablet Commonly known as: TOPROL-XL   spironolactone 25 MG tablet Commonly known as: ALDACTONE       TAKE these medications    albuterol 108 (90 Base) MCG/ACT inhaler Commonly known as: VENTOLIN HFA Inhale 2 puffs into the lungs every 6 (six) hours as needed for wheezing or shortness of breath. What changed: when to take this   amiodarone 200 MG tablet Commonly known as: PACERONE Take 1 tablet (200 mg total) by mouth 2 (two) times daily.   Anoro Ellipta 62.5-25 MCG/ACT Aepb Generic drug: umeclidinium-vilanterol INHALE 1 PUFF INTO THE LUNGS DAILY What changed: when to take this   apixaban 5 MG Tabs tablet Commonly known as: ELIQUIS Take 1 tablet (5 mg total) by mouth 2 (two) times daily.   aspirin EC 81 MG tablet Take 81 mg by mouth daily. Swallow whole.   atorvastatin 80  MG tablet Commonly known as: LIPITOR Take 80 mg by mouth at bedtime.   calcium-vitamin D 500-200 MG-UNIT tablet Commonly known as: OSCAL WITH D Take 1 tablet by mouth 2 (two) times daily. What changed: when to take this   dupilumab 200 MG/1.14ML prefilled syringe Commonly known as: DUPIXENT Inject 200 mg into the skin every 14 (fourteen) days. Every other Friday   Dupixent 300 MG/2ML Sopn Generic drug: Dupilumab Inject 300 mg into the skin every 14 (fourteen) days.   OVER THE COUNTER MEDICATION Apply 1 Application topically in the morning and at bedtime. OTC Cream   potassium chloride SA 20 MEQ tablet Commonly known as: KLOR-CON M Take 1 tablet (20 mEq total) by mouth daily.   Torsemide 40 MG Tabs Take 40 mg by mouth every evening. What changed: You were already taking a medication with the same name, and this prescription was added. Make sure you understand how and when to take each.   Torsemide 40 MG Tabs Take 80 mg by mouth every morning. Start taking on: August 05, 2022 What changed:  medication strength how much to take when to take  this        Follow-up Information     Mapp, Claudia Desanctis, MD Follow up.   Contact information: [place] [address] [phone]                Discharge Exam: Danley Danker Weights   08/01/22 0402 08/02/22 0624 08/04/22 0423  Weight: 88.3 kg 88 kg 89.4 kg   BP 139/69 (BP Location: Left Arm)   Pulse 80   Temp 97.8 F (36.6 C) (Oral)   Resp 18   Ht '5\' 2"'$  (1.575 m)   Wt 89.4 kg   SpO2 97%   BMI 36.05 kg/m   Neurology awake and alert ENT with mild pallor Cardiovascular with S1 and S2 present and rhythmic with no gallops, positive systolic murmur at the apex Respiratory with no rales or wheezing Abdomen with no distention  Trace lower extremity edema, unna boots in place    Condition at discharge: stable  The results of significant diagnostics from this hospitalization (including imaging, microbiology,  ancillary and laboratory) are listed below for reference.   Imaging Studies: CARDIAC CATHETERIZATION  Result Date: 08/03/2022 Findings: RA = 9 RV = 55/10 PA = 44/11 (23) PCW = 9 Fick cardiac output/index = 6.1/3.2 Thermo CO/CI = 4.8/2.5 PVR = 2.3 (Fick) 2.9 (TD) Ao sat = 95% PA sat = 67%, 67% PAPi = 5.0 Assessment: 1. Well compensated fluid status with normal cardiac output 2. Mild PH Plan/Discussion: Restart oral diuretics. No need for inotropic support at this time. No significant v-waves in RA or PCWP tracings. Glori Bickers, MD 9:07 AM  US RENAL  Result Date: 07/31/2022 CLINICAL DATA:  Acute kidney injury. EXAM: RENAL / URINARY TRACT ULTRASOUND COMPLETE COMPARISON:  CT 02/17/2017 FINDINGS: Right Kidney: Renal measurements: 10.7 x 4.9 x 4.9 cm = volume: 139 mL. No hydronephrosis. Diffusely increased renal parenchymal echogenicity. No renal calculi or focal lesion. Left Kidney: Renal measurements: 9.7 x 5.9 x 5.4 cm = volume: 160 mL. No hydronephrosis. Diffusely increased renal parenchymal echogenicity. No renal calculi or focal lesion. Bladder: Appears normal for degree of bladder distention. Both ureteral jets are demonstrated. Other: None. IMPRESSION: 1. Mild increased renal parenchymal echogenicity typical of chronic medical renal disease. 2. No hydronephrosis or focal renal abnormality Electronically Signed   By: Keith Rake M.D.   On: 07/31/2022 14:44   ECHOCARDIOGRAM COMPLETE  Result Date: 07/27/2022    ECHOCARDIOGRAM REPORT   Patient Name:   [name] Date of Exam: 07/27/2022 Medical Rec #:  [id number]    Height:       62.0 in Accession #:    [id number]   Weight:       222.3 lb Date of Birth:  Aug 20, 1951   BSA:          2.000 m Patient Age:    18 years     BP:           113/53 mmHg Patient Gender: F            HR:           77 bpm. Exam Location:  Inpatient Procedure: 2D Echo, 3D Echo, Strain Analysis, Intracardiac Opacification Agent,            Cardiac Doppler and Color Doppler  Indications:    CHF-Acute Systolic G25.63  History:        Patient has prior history of Echocardiogram examinations, most  recent 06/16/2021. CHF, ICD, Arrythmias:Atrial Flutter,                 Signs/Symptoms:Shortness of Breath; Risk Factors:Hypertension,                 Diabetes, Dyslipidemia, Current Smoker and Sleep Apnea.  Sonographer:    Greer Pickerel Referring Phys: [phone] AMY D CLEGG  Sonographer Comments: Image acquisition challenging due to COPD, Image acquisition challenging due to respiratory motion and Image acquisition challenging due to patient body habitus. Global longitudinal strain was attempted. IMPRESSIONS  1. No thrombus seen with Definity contrast. Left ventricular ejection fraction, by estimation, is 25 to 30%. Left ventricular ejection fraction by 3D volume is 26 %. The left ventricle has severely decreased function. The left ventricle demonstrates global hypokinesis. Left ventricular diastolic function could not be evaluated. There is the interventricular septum is flattened in diastole ('D' shaped left ventricle), consistent with right ventricular volume overload. The average left ventricular global longitudinal strain is -6.8 %. The global longitudinal strain is abnormal.  2. Right ventricular systolic function is low normal. The right ventricular size is mildly enlarged. There is moderately elevated pulmonary artery systolic pressure.  3. Left atrial size was severely dilated.  4. Right atrial size was severely dilated.  5. The mitral valve is normal in structure. Mild to moderate mitral valve regurgitation. Moderate mitral annular calcification.  6. Tricuspid valve regurgitation is severe.  7. The aortic valve is tricuspid. There is mild calcification of the aortic valve. Aortic valve regurgitation is trivial. Aortic valve sclerosis/calcification is present, without any evidence of aortic stenosis.  8. The inferior vena cava is dilated in size with <50% respiratory  variability, suggesting right atrial pressure of 15 mmHg. Comparison(s): No significant change from prior study. Prior images reviewed side by side. TR was severe on the previous TEE as well (triangular jet with "v-wave cutoff" was seen on that study). FINDINGS  Left Ventricle: No thrombus seen with Definity contrast. Left ventricular ejection fraction, by estimation, is 25 to 30%. Left ventricular ejection fraction by 3D volume is 26 %. The left ventricle has severely decreased function. The left ventricle demonstrates global hypokinesis. Definity contrast agent was given IV to delineate the left ventricular endocardial borders. The average left ventricular global longitudinal strain is -6.8 %. The global longitudinal strain is abnormal. The left ventricular internal cavity size was normal in size. There is no left ventricular hypertrophy. The interventricular septum is flattened in diastole ('D' shaped left ventricle), consistent with right ventricular volume overload. Left ventricular diastolic  function could not be evaluated due to atrial fibrillation. Left ventricular diastolic function could not be evaluated. Right Ventricle: The right ventricular size is mildly enlarged. No increase in right ventricular wall thickness. Right ventricular systolic function is low normal. There is moderately elevated pulmonary artery systolic pressure. The tricuspid regurgitant  velocity is 2.87 m/s, and with an assumed right atrial pressure of 15 mmHg, the estimated right ventricular systolic pressure is 29.9 mmHg. Left Atrium: Left atrial size was severely dilated. Right Atrium: Right atrial size was severely dilated. Pericardium: There is no evidence of pericardial effusion. Mitral Valve: The mitral valve is normal in structure. Moderate mitral annular calcification. Mild to moderate mitral valve regurgitation, with centrally-directed jet. MV peak gradient, 9.2 mmHg. The mean mitral valve gradient is 4.0 mmHg. Tricuspid  Valve: The tricuspid valve is normal in structure. Tricuspid valve regurgitation is severe. Aortic Valve: The aortic valve is tricuspid. There is mild calcification of the  aortic valve. Aortic valve regurgitation is trivial. Aortic valve sclerosis/calcification is present, without any evidence of aortic stenosis. Pulmonic Valve: The pulmonic valve was normal in structure. Pulmonic valve regurgitation is not visualized. Aorta: The aortic root and ascending aorta are structurally normal, with no evidence of dilitation. Venous: The inferior vena cava is dilated in size with less than 50% respiratory variability, suggesting right atrial pressure of 15 mmHg. The inferior vena cava and the hepatic vein show a pattern of systolic flow reversal, suggestive of tricuspid regurgitation. IAS/Shunts: No atrial level shunt detected by color flow Doppler. Additional Comments: A device lead is visualized in the right ventricle.  LEFT VENTRICLE PLAX 2D LVIDd:         5.50 cm         Diastology LVIDs:         4.50 cm         LV e' medial:    10.10 cm/s LV PW:         1.20 cm         LV E/e' medial:  14.2 LV IVS:        0.80 cm         LV e' lateral:   15.20 cm/s LVOT diam:     1.90 cm         LV E/e' lateral: 9.4 LV SV:         33 LV SV Index:   16              2D LVOT Area:     2.84 cm        Longitudinal                                Strain                                2D Strain GLS  -6.8 %                                Avg:                                 3D Volume EF                                LV 3D EF:    Left                                             ventricul                                             ar                                             ejection  fraction                                             by 3D                                             volume is                                             26 %.                                 3D Volume EF:                                 3D EF:        26 %                                LV EDV:       138 ml                                LV ESV:       102 ml                                LV SV:        36 ml RIGHT VENTRICLE RV S prime:     11.80 cm/s TAPSE (M-mode): 1.7 cm LEFT ATRIUM              Index        RIGHT ATRIUM           Index LA diam:        4.90 cm  2.45 cm/m   RA Area:     23.00 cm LA Vol (A2C):   110.0 ml 55.01 ml/m  RA Volume:   66.80 ml  33.41 ml/m LA Vol (A4C):   64.5 ml  32.26 ml/m LA Biplane Vol: 85.5 ml  42.76 ml/m  AORTIC VALVE             PULMONIC VALVE LVOT Vmax:   79.00 cm/s  PR End Diast Vel: 3.82 msec LVOT Vmean:  54.100 cm/s LVOT VTI:    0.115 m  AORTA Ao Root diam: 3.10 cm Ao Asc diam:  3.60 cm MITRAL VALVE                TRICUSPID VALVE MV Area (PHT): 3.91 cm     TR Peak grad:   32.9 mmHg MV Area VTI:   1.28 cm     TR Vmax:        287.00 cm/s MV Peak grad:  9.2 mmHg MV Mean grad:  4.0 mmHg     SHUNTS MV Vmax:       1.52 m/s     Systemic VTI:  0.12  m MV Vmean:      85.6 cm/s    Systemic Diam: 1.90 cm MV Decel Time: 194 msec MR Peak grad: 91.4 mmHg MR Mean grad: 56.0 mmHg MR Vmax:      478.00 cm/s MR Vmean:     349.0 cm/s MV E velocity: 143.00 cm/s MV A velocity: 50.90 cm/s MV E/A ratio:  2.81 Mihai Croitoru MD Electronically signed by Sanda Klein MD Signature Date/Time: 07/27/2022/4:34:05 PM    Final    VAS [place] LOWER EXTREMITY VENOUS (DVT)  Result Date: 07/26/2022  Lower Venous DVT Study Patient Name:  [name]  Date of Exam:   07/26/2022 Medical Rec #: [id number]     Accession #:    [id number] Date of Birth: 06/25/1951    Patient Gender: F Patient Age:   66 years Exam Location:  [facility] Procedure:      VAS [place] LOWER EXTREMITY VENOUS (DVT) Referring Phys: Wynetta Fines --------------------------------------------------------------------------------  Indications: Edema.  Limitations: Poor ultrasound/tissue interface, body habitus and unable to obtain long axis image of left  popliteal vein secondary to patient body habitus. Comparison Study: 06/17/2022- negative right lower extremity venous duplex Performing Technologist: Maudry Mayhew MHA, RDMS, RVT, RDCS  Examination Guidelines: A complete evaluation includes B-mode imaging, spectral Doppler, color Doppler, and power Doppler as needed of all accessible portions of each vessel. Bilateral testing is considered an integral part of a complete examination. Limited examinations for reoccurring indications may be performed as noted. The reflux portion of the exam is performed with the patient in reverse Trendelenburg.  +---------+---------------+---------+-----------+---------------+--------------+ RIGHT    CompressibilityPhasicitySpontaneityProperties     Thrombus Aging +---------+---------------+---------+-----------+---------------+--------------+ CFV      Full           No       Yes                                      +---------+---------------+---------+-----------+---------------+--------------+ SFJ      Full                                                             +---------+---------------+---------+-----------+---------------+--------------+ FV Prox  Full                    Yes                                      +---------+---------------+---------+-----------+---------------+--------------+ FV Mid                           Yes        patent by color                                                           Doppler                       +---------+---------------+---------+-----------+---------------+--------------+ FV Distal  Yes        patent by color                                                           Doppler                       +---------+---------------+---------+-----------+---------------+--------------+ POP      Full           No       Yes                                       +---------+---------------+---------+-----------+---------------+--------------+ PTV                              Yes        patent by color                                                           Doppler                       +---------+---------------+---------+-----------+---------------+--------------+   Right Technical Findings: Not visualized segments include PFV, peroneal veins, limited visualiazation PTV.  +---------+---------------+---------+-----------+---------------+--------------+ LEFT     CompressibilityPhasicitySpontaneityProperties     Thrombus Aging +---------+---------------+---------+-----------+---------------+--------------+ CFV      Full           No       Yes                                      +---------+---------------+---------+-----------+---------------+--------------+ SFJ      Full                                                             +---------+---------------+---------+-----------+---------------+--------------+ FV Prox  Full                                                             +---------+---------------+---------+-----------+---------------+--------------+ FV Mid                           Yes        patent by color  Doppler                       +---------+---------------+---------+-----------+---------------+--------------+ FV Distal               No       Yes        patent by color                                                           and PW Doppler                +---------+---------------+---------+-----------+---------------+--------------+ POP                              Yes        patent by color                                                           Doppler                       +---------+---------------+---------+-----------+---------------+--------------+   Left Technical Findings: Not visualized segments  include PFV, PTV, peroneal veins.   Summary: RIGHT: - There is no evidence of deep vein thrombosis in the lower extremity. However, portions of this examination were limited- see technologist comments above.  - No cystic structure found in the popliteal fossa.  LEFT: - There is no evidence of deep vein thrombosis in the lower extremity. However, portions of this examination were limited- see technologist comments above.  - No cystic structure found in the popliteal fossa.  Pulsatile lower extremity venous flow is suggestive of possibly elevated right heart pressure. *See table(s) above for measurements and observations. Electronically signed by Deitra Mayo MD on 07/26/2022 at 12:56:38 PM.    Final    [place] EKG SITE RITE  Result Date: 07/26/2022 If Site Rite image not attached, placement could not be confirmed due to current cardiac rhythm.  CT Angio Chest PE W and/or Wo Contrast  Result Date: 07/25/2022 CLINICAL DATA:  Shortness of breath, cough, leg swelling, history COPD, CHF, asthma, question pulmonary embolism EXAM: CT ANGIOGRAPHY CHEST WITH CONTRAST TECHNIQUE: Multidetector CT imaging of the chest was performed using the standard protocol during bolus administration of intravenous contrast. Multiplanar CT image reconstructions and MIPs were obtained to evaluate the vascular anatomy. RADIATION DOSE REDUCTION: This exam was performed according to the departmental dose-optimization program which includes automated exposure control, adjustment of the mA and/or kV according to patient size and/or use of iterative reconstruction technique. CONTRAST:  5m OMNIPAQUE IOHEXOL 350 MG/ML SOLN IV COMPARISON:  CT chest 11/14/2018 FINDINGS: Cardiovascular: Scattered atherosclerotic calcifications aorta, proximal great vessels, and coronary arteries. Aorta normal caliber. LEFT subclavian ICD lead within RIGHT ventricle. Enlargement of cardiac chambers. No pericardial effusion. Pulmonary arteries adequately  opacified and patent. No evidence of pulmonary embolism. Mediastinum/Nodes: Normal sized axillary lymph nodes. No thoracic adenopathy. Esophagus unremarkable. Base of cervical region normal appearance. Lungs/Pleura: Trace BILATERAL pleural effusions. Scattered mosaic attenuation in both lungs similar to prior exam. Minimal bibasilar atelectasis.  No acute infiltrate, pleural effusion, or pneumothorax. Few tiny nodular foci in RIGHT upper lobe appear grossly unchanged. Upper Abdomen: Reflux of contrast into IVC and hepatic veins question elevated RIGHT heart pressure. Calcified granuloma superiorly in liver versus diaphragmatic unchanged. Remaining visualized upper abdomen unremarkable. Musculoskeletal: Scattered subcutaneous and soft tissue edema. No acute osseous findings. Review of the MIP images confirms the above findings. IMPRESSION: No evidence of pulmonary embolism. Scattered mosaic attenuation in both lungs with minimal bibasilar atelectasis and trace BILATERAL pleural effusions. Scattered atherosclerotic calcifications including coronary arteries. Enlargement of cardiac chambers with question elevated RIGHT heart pressures. Aortic Atherosclerosis (ICD10-I70.0). Electronically Signed   By: Lavonia Dana M.D.   On: 07/25/2022 19:03   DG Chest Port 1 View  Result Date: 07/25/2022 CLINICAL DATA:  Shortness of breath. EXAM: PORTABLE CHEST 1 VIEW COMPARISON:  06/17/2022 FINDINGS: 1345 hours. The cardio pericardial silhouette is enlarged. The lungs are clear without focal pneumonia, edema, pneumothorax or pleural effusion. Interstitial markings are diffusely coarsened with chronic features. Bones are diffusely demineralized. Telemetry leads overlie the chest. IMPRESSION: Enlarged cardiopericardial silhouette without acute cardiopulmonary findings. Electronically Signed   By: Misty Stanley M.D.   On: 07/25/2022 13:51   US THYROID  Result Date: 07/22/2022 CLINICAL DATA:  71 year old female with 2 thyroid  nodules EXAM: THYROID ULTRASOUND TECHNIQUE: Ultrasound examination of the thyroid gland and adjacent soft tissues was performed. COMPARISON:  No prior duplex FINDINGS: Parenchymal Echotexture: Moderately heterogenous Isthmus: 0.5 cm Right lobe: 6.8 cm x 3.9 cm x 1.9 cm Left lobe: 6.2 cm x 3.0 cm x 2.2 cm _________________________________________________________ Estimated total number of nodules >/= 1 cm: 3 Number of spongiform nodules >/=  2 cm not described below (TR1): 0 Number of mixed cystic and solid nodules >/= 1.5 cm not described below (TR2): 0 _________________________________________________________ Nodule # 1: Location: Right; Superior Maximum size: 1.9 cm; Other 2 dimensions: 1.7 cm x 0.7 cm Composition: cystic/almost completely cystic (0) Echogenicity: anechoic (0) Shape: not taller-than-wide (0) Margins: smooth (0) Echogenic foci: none (0) ACR TI-RADS total points: 0. ACR TI-RADS risk category: TR1 (0-1 points). ACR TI-RADS recommendations: Cystic nodule does not meet criteria for surveillance or biopsy _________________________________________________________ Nodule # 2: Location: Right; Inferior Maximum size: 0.7 cm; Other 2 dimensions: 0.7 cm x 0.5 cm Composition: cystic/almost completely cystic (0) Echogenicity: anechoic (0) Shape: not taller-than-wide (0) Margins: smooth (0) Echogenic foci: none (0) ACR TI-RADS total points: 0. ACR TI-RADS risk category: TR1 (0-1 points). ACR TI-RADS recommendations: Cystic nodule does not meet criteria for surveillance or biopsy _________________________________________________________ Nodule # 3: Location: Left; Mid Maximum size: 2.1 cm; Other 2 dimensions: 1.7 cm x 1.6 cm Composition: solid/almost completely solid (2) Echogenicity: isoechoic (1) Shape: not taller-than-wide (0) Margins: ill-defined (0) Echogenic foci: none (0) ACR TI-RADS total points: 3. ACR TI-RADS risk category: TR3 (3 points). ACR TI-RADS recommendations: Surveillance  _________________________________________________________ Nodule # 4: Location: Left; inferior Maximum size: 1.9 cm; Other 2 dimensions: 1.5 cm x 0.1 cm Composition: cannot determine (2) Echogenicity: hypoechoic (2) Shape: taller-than-wide (3) Margins: ill-defined (0) Echogenic foci: none (0) ACR TI-RADS total points: 7. ACR TI-RADS risk category: TR5 (>/= 7 points). ACR TI-RADS recommendations: Biopsy _________________________________________________________ No adenopathy IMPRESSION: Multinodular goiter. Left inferior thyroid nodule (labeled 4, 1.9 cm) meets criteria for biopsy, as designated by the newly established ACR TI-RADS criteria, and referral for biopsy is recommended. Left mid thyroid nodule (labeled 3, 2.1 cm, TR 3) meets criteria for surveillance, as designated by the newly established ACR TI-RADS criteria.  Surveillance ultrasound study recommended to be performed annually up to 5 years. Recommendations follow those established by the new ACR TI-RADS criteria (J Am Coll Radiol 6060;04:599-774). Electronically Signed   By: Corrie Mckusick D.O.   On: 07/22/2022 14:49   CUP PACEART REMOTE DEVICE CHECK  Result Date: 07/14/2022 Scheduled remote reviewed. Normal device function.  There was one short NSVT arrhythmia detected Next remote 91 days. Kathy Breach, RN, CCDS, [employer]   Microbiology: Results for orders placed or performed during the hospital encounter of 07/25/22  Resp Panel by RT-PCR (Flu A&B, Covid) Anterior Nasal Swab     Status: None   Collection Time: 07/25/22  1:21 PM   Specimen: Anterior Nasal Swab  Result Value Ref Range Status   SARS Coronavirus 2 by RT PCR NEGATIVE NEGATIVE Final    Comment: (NOTE) SARS-CoV-2 target nucleic acids are NOT DETECTED.  The SARS-CoV-2 RNA is generally detectable in upper respiratory specimens during the acute phase of infection. The lowest concentration of SARS-CoV-2 viral copies this assay can detect is 138 copies/mL. A negative  result does not preclude SARS-Cov-2 infection and should not be used as the sole basis for treatment or other patient management decisions. A negative result may occur with  improper specimen collection/handling, submission of specimen other than nasopharyngeal swab, presence of viral mutation(s) within the areas targeted by this assay, and inadequate number of viral copies(<138 copies/mL). A negative result must be combined with clinical observations, patient history, and epidemiological information. The expected result is Negative.  Fact Sheet for Patients:  EntrepreneurPulse.com.au  Fact Sheet for Healthcare Providers:  IncredibleEmployment.be  This test is no t yet approved or cleared by the [place] FDA and  has been authorized for detection and/or diagnosis of SARS-CoV-2 by FDA under an Emergency Use Authorization (EUA). This EUA will remain  in effect (meaning this test can be used) for the duration of the COVID-19 declaration under Section 564(b)(1) of the Act, 21 U.S.C.section 360bbb-3(b)(1), unless the authorization is terminated  or revoked sooner.       Influenza A by PCR NEGATIVE NEGATIVE Final   Influenza B by PCR NEGATIVE NEGATIVE Final    Comment: (NOTE) The Xpert Xpress SARS-CoV-2/FLU/RSV plus assay is intended as an aid in the diagnosis of influenza from Nasopharyngeal swab specimens and should not be used as a sole basis for treatment. Nasal washings and aspirates are unacceptable for Xpert Xpress SARS-CoV-2/FLU/RSV testing.  Fact Sheet for Patients: EntrepreneurPulse.com.au  Fact Sheet for Healthcare Providers: IncredibleEmployment.be  This test is not yet approved or cleared by the [place] FDA and has been authorized for detection and/or diagnosis of SARS-CoV-2 by FDA under an Emergency Use Authorization (EUA). This EUA will remain in effect (meaning this test can be used)  for the duration of the COVID-19 declaration under Section 564(b)(1) of the Act, 21 U.S.C. section 360bbb-3(b)(1), unless the authorization is terminated or revoked.  Performed at [facility], [place] [address] [place], [place] [address]    *Note: Due to a large number of results and/or encounters for the requested time period, some results have not been displayed. A complete set of results can be found in Results Review.    Labs: CBC: Recent Labs  Lab 07/29/22 0500 07/30/22 0610 07/31/22 0424 08/01/22 0358  WBC 7.5 7.6 7.5 8.9  HGB 10.3* 9.9* 10.4* 10.8*  HCT 32.5* 30.5* 32.8* 33.1*  MCV 81.7 80.9 81.6 80.9  PLT 221 206 227 224   Basic  Metabolic Panel: Recent Labs  Lab 07/31/22 0424 08/01/22 0358 08/02/22 0242 08/03/22 0415 08/04/22 0223  NA 139 139 138 140 136  K 4.2 4.2 3.6 3.4* 3.3*  CL 92* 92* 94* 96* 97*  CO2 34* 35* 32 33* 29  GLUCOSE 75 73 88 116* 183*  BUN 40* 39* 39* 36* 35*  CREATININE 2.64* 2.96* 3.15* 3.33* 3.00*  CALCIUM 9.2 8.9 8.6* 8.5* 8.3*   Liver Function Tests: No results for input(s): "AST", "ALT", "ALKPHOS", "BILITOT", "PROT", "ALBUMIN" in the last 168 hours. CBG: Recent Labs  Lab 08/03/22 1222 08/03/22 1611 08/03/22 2116 08/04/22 0808 08/04/22 1150  GLUCAP 194* 169* 153* 148* 174*    Discharge time spent: greater than 30 minutes.  Signed: Tawni Millers, MD [facility] 08/04/2022

## 2022-08-04 NOTE — Care Management Important Message (Signed)
Important Message  Patient Details  Name: [name] MRN: [id number] Date of Birth: 04-Jul-1951   Medicare Important Message Given:  Yes     Shelda Altes 08/04/2022, 11:04 AM

## 2022-08-04 NOTE — Progress Notes (Addendum)
Advanced Heart Failure Rounding Note  PCP-Cardiologist: None   Subjective:    Admitted with A/C HFrEF and new onset A flutter. Started on amio + heparin. Lasix gtt + DBA.   Echo LVEF 25-30%, D shaped LV, RV mildly dilated w/ mod elevated RVSP, RV systolic fx mildly reduced, severe BAE, severe TR, mild-mod MR, IVC dilated, assumed RAP 15    10/29 DBA cut back to 1 mcg and lasix drip stopped. Renal US no hydronephrosis.  10/30 DBA off, transitioned to PO amio 10/31 Back in A flutter. Restarted on amio drip. Converted to NSR later that day.  11/1 RHC  Well compensated fluid status with normal cardiac output, mild PH    Wt trending back up off diuretics. Admit weight 222--->194 yesterday->197 today. CVP 7   SCr 3.33>>3.00 K 3.3  RN reported induration above insertion site of PICC yesterday. With small amount of pressure, cream pus came from insertion site. Area cleaned well, bio patch applied.   Pt feels well today. No complaints. Denies pain/ irritation at PICC slight. Wants to go home, says her bills are due tomorrow.   [place] 11/1  Findings:   RA = 9 RV = 55/10 PA = 44/11 (23) PCW = 9 Fick cardiac output/index = 6.1/3.2 Thermo CO/CI = 4.8/2.5 PVR = 2.3 (Fick) 2.9 (TD) Ao sat = 95% PA sat = 67%, 67% PAPi = 5.0   Assessment: 1. Well compensated fluid status with normal cardiac output 2. Mild PH  Objective:   Weight Range: 89.4 kg Body mass index is 36.05 kg/m.   Vital Signs:   Temp:  [97.5 F (36.4 C)-97.8 F (36.6 C)] 97.8 F (36.6 C) (11/02 0809) Pulse Rate:  [78-88] 80 (11/02 0809) Resp:  [14-24] 18 (11/02 0809) BP: (109-147)/(66-77) 139/69 (11/02 0809) SpO2:  [93 %-97 %] 97 % (11/01 1952) Weight:  [89.4 kg] 89.4 kg (11/02 0423) Last BM Date : 08/02/22  Weight change: Filed Weights   08/01/22 0402 08/02/22 0624 08/04/22 0423  Weight: 88.3 kg 88 kg 89.4 kg    Intake/Output:   Intake/Output Summary (Last 24 hours) at 08/04/2022 0814 Last data  filed at 08/03/2022 1730 Gross per 24 hour  Intake 1589.84 ml  Output --  Net 1589.84 ml    Physical Exam   CVP 7  General:   obese, chronically ill appearing, no distress  HEENT: normal Neck: supple. JVD 7-8 cm Carotids 2+ bilat; no bruits. No lymphadenopathy or thryomegaly appreciated. Cor: PMI nondisplaced. Regular rate & rhythm. 2/6 TR murmur  Lungs: clear Abdomen: obese, soft, nontender, nondistended. No hepatosplenomegaly. No bruits or masses. Good bowel sounds. Extremities: no cyanosis, clubbing, rash, obese LEs, no pitting edema. Bilateral unna boots. RUE PICC bandaged  Neuro: alert & orientedx3, cranial nerves grossly intact. moves all 4 extremities w/o difficulty. Affect pleasant  Telemetry   NSR 81 bpm, personally reviewed   Labs    CBC No results for input(s): "WBC", "NEUTROABS", "HGB", "HCT", "MCV", "PLT" in the last 72 hours.  Basic Metabolic Panel Recent Labs    08/03/22 0415 08/04/22 0223  NA 140 136  K 3.4* 3.3*  CL 96* 97*  CO2 33* 29  GLUCOSE 116* 183*  BUN 36* 35*  CREATININE 3.33* 3.00*  CALCIUM 8.5* 8.3*   Liver Function Tests No results for input(s): "AST", "ALT", "ALKPHOS", "BILITOT", "PROT", "ALBUMIN" in the last 72 hours.  No results for input(s): "LIPASE", "AMYLASE" in the last 72 hours. Cardiac Enzymes No results for input(s): "CKTOTAL", "  CKMB", "CKMBINDEX", "TROPONINI" in the last 72 hours.  BNP: BNP (last 3 results) Recent Labs    06/26/22 1445 07/13/22 1609 07/25/22 1321  BNP 418.1* 358.7* 461.6*    ProBNP (last 3 results) No results for input(s): "PROBNP" in the last 8760 hours.   D-Dimer No results for input(s): "DDIMER" in the last 72 hours. Hemoglobin A1C No results for input(s): "HGBA1C" in the last 72 hours. Fasting Lipid Panel No results for input(s): "CHOL", "HDL", "LDLCALC", "TRIG", "CHOLHDL", "LDLDIRECT" in the last 72 hours. Thyroid Function Tests No results for input(s): "TSH", "T4TOTAL", "T3FREE",  "THYROIDAB" in the last 72 hours.  Invalid input(s): "FREET3"  Other results:   Imaging    CARDIAC CATHETERIZATION  Result Date: 08/03/2022 Findings: RA = 9 RV = 55/10 PA = 44/11 (23) PCW = 9 Fick cardiac output/index = 6.1/3.2 Thermo CO/CI = 4.8/2.5 PVR = 2.3 (Fick) 2.9 (TD) Ao sat = 95% PA sat = 67%, 67% PAPi = 5.0 Assessment: 1. Well compensated fluid status with normal cardiac output 2. Mild PH Plan/Discussion: Restart oral diuretics. No need for inotropic support at this time. No significant v-waves in RA or PCWP tracings. Glori Bickers, MD 9:07 AM    Medications:     Scheduled Medications:  angioplasty book   Does not apply Once   apixaban  5 mg Oral BID   aspirin EC  81 mg Oral Daily   atorvastatin  80 mg Oral Daily   Chlorhexidine Gluconate Cloth  6 each Topical Daily   insulin aspart  0-15 Units Subcutaneous TID WC   insulin aspart  0-5 Units Subcutaneous QHS   insulin glargine-yfgn  5 Units Subcutaneous Daily   nicotine  21 mg Transdermal Daily   polyethylene glycol  17 g Oral Daily   potassium chloride  40 mEq Oral Once   senna-docusate  1 tablet Oral BID   sodium chloride flush  10-40 mL Intracatheter Q12H   sodium chloride flush  3 mL Intravenous Q12H   sodium chloride flush  3 mL Intravenous Q12H   sodium chloride flush  3 mL Intravenous Q12H   umeclidinium-vilanterol  1 puff Inhalation Daily    Infusions:  sodium chloride     sodium chloride Stopped (07/26/22 0932)   sodium chloride     amiodarone 30 mg/hr (08/04/22 0703)    PRN Medications: sodium chloride, sodium chloride, acetaminophen, levalbuterol, ondansetron (ZOFRAN) IV, sodium chloride flush, sodium chloride flush, sodium chloride flush    Patient Profile    [name] is a 71 year old with a history of DMII, COPD, HTN, chronic respiratory failure, tobacco abuse, [employer] Jude ICD, and chronic HFrEF. EF has 25-30% for at least 5 years. Last LHC 2017, normal coronaries.    Presented with increased  shortness of breath. New onset A flutter and A/C HFrEF with marked volume overload.    Assessment/Plan   1. Acute on Chronic Hypoxic Respiratory Failure --> Multifactorial AECOPD and A/C HFrEF.  - Started on solumedrol. + inhalers. -Chronically on 2-3 liters [place]. Placed on Bipap on arrival.  - Now on nasal cannula.  - SARS2 negative - CTA - negative PE.  - stable on 2 liters.    2. A/C HFrEF-->Cardiogenic Shock - NICM LHC 2017 normal cors. - 2022 Echo EF 25-30% which has been down for many years.  - s/p STJ ICD - Echo this admit LVEF 25-30%, D shaped LV, RV mildly dilated w/ mod elevated RVSP, RV systolic fx mildly reduced, severe BAE, severe TR,  mild-mod MR, IVC dilated, assumed RAP 15  - Dobutamine stopped 10/3. Co-ox remained stable. CI ok on RHC at 2.5  - [place] 11/1 w/ well compensated fluid status with normal cardiac output, mild PH; RA 9, PCW 9, CI 2.5  - Switch back to PO diuretics today, torsemide 80 qam/40 qpm (on 40 mg bid PTA). - holding spiro and Farxiga w/ AKI  - may need PRN metolazone at discharge  - c/w unna boots.    3. New Onset A flutter RVR - Of note had TEE 06/2022- no thrombus noted.  - Chemically converted with IV amio.  - Switch to PO amiodarone, 200 mg bid  - Continue eliquis    4. AKI  -Creatinine baseline ~ 1.2  -On admit 1.7-->today 3.15 -> 3.33->3.00 - Renal u/s- no hydronephrosis   5. DMII, uncontrolled  - On SSI  - Possible SGLT2i prior to d/c   6. Obesity  Body mass index is 36.05 kg/m. - wt loss advised   7. Hypokalemia - K 3.3 - gentle K supp   D/w Dr. Haroldine Laws. [place] for d/c home today w/ close clinic f/u in the [facility]. F/u is arranged for 11/9. Will place appt info in AVS. Remove PICC line for d/c.   Cardiac Meds for Discharge Amiodarone 200 mg bid  Eliquis 5 mg bid Atorvastatin 80 mg daily  Torsemide 80 mg qam/ 40 mg qpm  KCl 20 mEq daily   Will obtain BMP in 1 wk at clinic f/u.   Discussed plan w/ Dr. Cathlean Sauer    Length of  Stay: [place] PA-C  08/04/2022, 8:14 AM  Advanced Heart Failure Team Pager [phone] (M-F; 7a - 5p)  Please contact [facility] for night-coverage after hours (5p -7a ) and weekends on [email or web address]  Patient seen and examined with the above-signed Advanced Practice Provider and/or Housestaff. I personally reviewed laboratory data, imaging studies and relevant notes. I independently examined the patient and formulated the important aspects of the plan. I have edited the note to reflect any of my changes or salient points. I have personally discussed the plan with the patient and/or family.  Feels good. Denies CP or SOB. Remains in NSR. Scr improving. Wants to go home.   General:  Elderly. No resp difficulty HEENT: normal Neck: supple.JVP 10 . Carotids 2+ bilat; no bruits. No lymphadenopathy or thryomegaly appreciated. Cor: PMI nondisplaced. Regular rate & rhythm. No rubs, gallops or murmurs. Lungs: clear Abdomen: obese soft, nontender, nondistended. No hepatosplenomegaly. No bruits or masses. Good bowel sounds. Extremities: no cyanosis, clubbing, rash, edema Neuro: alert & orientedx3, cranial nerves grossly intact. moves all 4 extremities w/o difficulty. Affect pleasant  RHC numbers reviewed. Remains in NSR on amio.   [place] for d/c home today on above meds with clsoe f/u in HF Clinic. GDMT limited by CKD. Resume Farxiga at outpatient f/u. Add bidil as tolerated.   Glori Bickers, MD  12:26 PM

## 2022-08-04 NOTE — Progress Notes (Signed)
Physical Therapy Treatment Patient Details Name: [name] MRN: [id number] DOB: 1950-11-07 Today's Date: 08/04/2022   History of Present Illness Pt is a 71 y.o. female who presented 07/25/22 with cough, SOB, and lower extremity edema. Pt admitted with A/C HFrEF and new onset A flutter. Plan for [place] 08/03/22. PMH: COPD Gold stage II with chronic hypoxic respite failure on nocturnal nasal cannula, chronic combined HFpEF and HFrEF, LVEF 20-25%, status post AICD, IIDM, morbid obesity, cigarette smoker, HTN, HLD, lipoma    PT Comments    Pt finished using BSC on entry, asking for wet wash clothes. Once cleaned up pt agreeable to working with therapy on stair training as she states "I'm ready to go home." Pt is supervision for transfers and ambulation with RW and min guard for ascent/descent of 2 steps with rail on R. Pt with appropriate cardiovascular response to increased activity HR in high 90s and return to 70s with return to recliner. SpO2 on RA >90%O2 throughout session. D/c plans remain appropriate.    Recommendations for follow up therapy are one component of a multi-disciplinary discharge planning process, led by the attending physician.  Recommendations may be updated based on patient status, additional functional criteria and insurance authorization.  Follow Up Recommendations  No PT follow up     Assistance Recommended at Discharge PRN  Patient can return home with the following Assistance with cooking/housework;Help with stairs or ramp for entrance;Assist for transportation   Equipment Recommendations  Rolling walker (2 wheels)       Precautions / Restrictions Precautions Precautions: Fall;Other (comment) Precaution Comments: watch HR Restrictions Weight Bearing Restrictions: No     Mobility  Bed Mobility               General bed mobility comments: Up in chair upon PT arrival.    Transfers Overall transfer level: Needs assistance Equipment used: Rolling walker  (2 wheels) Transfers: Sit to/from Stand Sit to Stand: Supervision           General transfer comment: Supervision for safety. Stood from Youth worker.    Ambulation/Gait Ambulation/Gait assistance: Supervision Gait Distance (Feet): 350 Feet Assistive device: Rolling walker (2 wheels) Gait Pattern/deviations: Step-through pattern, Decreased stride length, Wide base of support Gait velocity: decr Gait velocity interpretation: 1.31 - 2.62 ft/sec, indicative of limited community ambulator   General Gait Details: slow, steady, gait, cues for navigation around obstacles in hallway and proximity to RW especially in tight spaces   Stairs Stairs: Yes Stairs assistance: Min guard Stair Management: One rail Right, Forwards, Step to pattern Number of Stairs: 2 General stair comments: pt able to ascend/descend 2 steps with rail on R coing, up pt with increased difficulty with use of R UE alone, encouraged to utilize both UE on rail and pt with improved power up and control with decent       Balance Overall balance assessment: Needs assistance Sitting-balance support: No upper extremity supported, Feet supported Sitting balance-Leahy Scale: Good     Standing balance support: During functional activity Standing balance-Leahy Scale: Fair Standing balance comment: Static standing with min guard without device. Assist/device for any dynamic movement but only needs 1 UE support.                            Cognition Arousal/Alertness: Awake/alert Behavior During Therapy: WFL for tasks assessed/performed Overall Cognitive Status: Impaired/Different from baseline Area of Impairment: Memory  Memory: Decreased short-term memory         General Comments: does not recall cues for RW management           General Comments General comments (skin integrity, edema, etc.): VSS on RA, appropriate increase in HR to high 90s with stair training, returns to  70s with seated back in recliner in room      Pertinent Vitals/Pain Pain Assessment Pain Assessment: No/denies pain     PT Goals (current goals can now be found in the care plan section) Acute Rehab PT Goals PT Goal Formulation: With patient Time For Goal Achievement: 08/10/22 Potential to Achieve Goals: Good Progress towards PT goals: Progressing toward goals    Frequency    Min 3X/week      PT Plan Current plan remains appropriate;Discharge plan needs to be updated       AM-PAC PT "6 Clicks" Mobility   Outcome Measure  Help needed turning from your back to your side while in a flat bed without using bedrails?: A Little Help needed moving from lying on your back to sitting on the side of a flat bed without using bedrails?: A Little Help needed moving to and from a bed to a chair (including a wheelchair)?: A Little Help needed standing up from a chair using your arms (e.g., wheelchair or bedside chair)?: A Little Help needed to walk in hospital room?: A Little Help needed climbing 3-5 steps with a railing? : A Little 6 Click Score: 18    End of Session Equipment Utilized During Treatment: Gait belt Activity Tolerance: Patient tolerated treatment well Patient left: in chair;with call bell/phone within reach Nurse Communication: Mobility status PT Visit Diagnosis: Unsteadiness on feet (R26.81);Other abnormalities of gait and mobility (R26.89);Muscle weakness (generalized) (M62.81);Difficulty in walking, not elsewhere classified (R26.2);History of falling (Z91.81)     Time: 7782-4235 PT Time Calculation (min) (ACUTE ONLY): 22 min  Charges:  $Gait Training: 8-22 mins                     Dontaye Hur B. Migdalia Dk PT, DPT Acute Rehabilitation Services Please use secure chat or  Call Office [phone]    [place] 08/04/2022, 10:34 AM

## 2022-08-05 ENCOUNTER — Telehealth: Payer: Self-pay

## 2022-08-05 LAB — LIPOPROTEIN A (LPA): Lipoprotein (a): 193.5 nmol/L — ABNORMAL HIGH (ref <75.0)

## 2022-08-05 NOTE — Patient Outreach (Signed)
  Care Coordination [facility] Note Transition Care Management Follow-up Telephone Call Date of discharge and from [facility] 07[facility]-08[facility]04/22 How have you been since you were released from the hospital? "I was just released yesterday but I feel good". Any questions or concerns? Yes-How long do I need to wear these TED stockings? Encouraged patient to speak to her PCP when she comes in to the clinic about how long she needs to wear them.  Items Reviewed: Did the pt receive and understand the discharge instructions provided? Yes  Medications obtained and verified? Yes  Other? No  Any new allergies since your discharge? No  Dietary orders reviewed? Yes Do you have support at home? Yes   Home Care and Equipment/Supplies: Were home health services ordered? no If so, what is the name of the agency? N/A  Has the agency set up a time to come to the patient's home? no Were any new equipment or medical supplies ordered?  No What is the name of the medical supply agency? N/A Were you able to get the supplies/equipment? no Do you have any questions related to the use of the equipment or supplies? No  Functional Questionnaire: (I = Independent and D = Dependent) ADLs: I  Bathing/Dressing- I  Meal Prep- I  Eating- I  Maintaining continence- I  Transferring/Ambulation- I  Managing Meds- I  Follow up appointments reviewed:  PCP Hospital f/u appt confirmed? No   Specialist Hospital f/u appt confirmed? Yes  [facility] 07/25/22 @ 08/03/22. Are transportation arrangements needed? No  If their condition worsens, is the pt aware to call PCP or go to the Emergency Dept.? Yes Was the patient provided with contact information for the PCP's office or ED? Yes Was to pt encouraged to call back with questions or concerns? Yes  SDOH assessments and interventions completed:   Yes  Care Coordination Interventions Activated:  Yes   Care Coordination  Interventions:  PCP follow up appointment requested   Encounter Outcome:  Pt. Visit Completed

## 2022-08-08 ENCOUNTER — Ambulatory Visit (HOSPITAL_COMMUNITY)
Admission: RE | Admit: 2022-08-08 | Discharge: 2022-08-08 | Disposition: A | Discharge status: Home / Self Care | Payer: Medicare Other | Source: Ambulatory Visit | Attending: Internal Medicine | Admitting: Internal Medicine

## 2022-08-08 ENCOUNTER — Other Ambulatory Visit: Payer: Self-pay | Admitting: Internal Medicine

## 2022-08-08 DIAGNOSIS — E042 Nontoxic multinodular goiter: Secondary | ICD-10-CM

## 2022-08-08 MED ORDER — LIDOCAINE HCL 1 % IJ SOLN
INTRAMUSCULAR | Status: AC
  Filled 2022-08-08: qty 20

## 2022-08-08 NOTE — Progress Notes (Signed)
Patient ID: [name], female   DOB: Sep 02, 1951, 71 y.o.   MRN: [id number] Pt presented to [place] dept today for left inferior thyroid nodule biopsy. On limited thyroid US today there is no discrete nodule identified that warrants biopsy. Images were reviewed by Dr. Dwaine Gale . Recommend f/u thyroid US in 1 year. Procedure cancelled. Pt informed.

## 2022-08-09 ENCOUNTER — Telehealth: Payer: Medicare Other

## 2022-08-09 ENCOUNTER — Telehealth: Payer: Self-pay | Admitting: Student

## 2022-08-09 ENCOUNTER — Other Ambulatory Visit (HOSPITAL_BASED_OUTPATIENT_CLINIC_OR_DEPARTMENT_OTHER): Payer: Self-pay

## 2022-08-09 NOTE — Telephone Encounter (Addendum)
Rec'd a call from [place] [phone]) requesting a call back in reference to the patient's diabetes.     Pt has reported her BS  as [phone] and they would like the  patient's diabetes regiment    [name] has also provided a Fax number as well of [phone]

## 2022-08-09 NOTE — Telephone Encounter (Signed)
RTC to [place] B who identified herself as an Chief Strategy Officer through [employer] who is working with [employer].  Is requesting information on patient's Diabetes regime.  Patient has upcoming appointment.  Review of medications can be completed.

## 2022-08-09 NOTE — Telephone Encounter (Deleted)
Rec'd a call from [place] [phone]) requesting a call back in reference to the patient's diabetes.    Pt has reported her BS  as [phone] and they would like the  patient diabetes regiment

## 2022-08-09 NOTE — Telephone Encounter (Signed)
TOC-HFU APPT  Name: Pegge, Cumberledge MRN: [id number]  Date: 08/18/2022 Status: Sch  Time: 10:15 AM Length: 30  Visit Type: OPEN ESTABLISHED [726] Copay: $0.00  Provider: Virl Axe, MD

## 2022-08-10 ENCOUNTER — Telehealth (HOSPITAL_COMMUNITY): Payer: Self-pay

## 2022-08-10 ENCOUNTER — Other Ambulatory Visit (HOSPITAL_COMMUNITY): Payer: Self-pay

## 2022-08-10 NOTE — Telephone Encounter (Addendum)
Pharmacy Transitions of Care Follow-up Telephone Call  Date of discharge: 08/04/22  Discharge Diagnosis: Atrial Flutter  How have you been since you were released from the hospital? Doing well since discharge, patient was confused about her diabetes regimen, patient was advised to stop [place] as noted on discharge instructions. Patient advised she is in contact with someone from [facility] who is assisting her with her diabetes regimen. Discussed the importance of taking Eliquis and prescribed and also discussed how to take amiodarone. Also confirmed how to take torsemide as patient was only taking 2 tablets in morning and 2 tablets at night. Prescription was written to take 4 tablets in morning and 2 tablets at night. Patient requested torsemide be transferred to [facility].  Medication changes made at discharge: START taking these medications  START taking these medications amiodarone 200 MG tablet Commonly known as: PACERONE Take 1 tablet (200 mg total) by mouth 2 (two) times daily.  Eliquis 5 MG Tabs tablet Generic drug: apixaban Take 1 tablet (5 mg total) by mouth 2 (two) times daily.   CHANGE how you take these medications  CHANGE how you take these medications albuterol 108 (90 Base) MCG/ACT inhaler Commonly known as: VENTOLIN HFA Inhale 2 puffs into the lungs every 6 (six) hours as needed for wheezing or shortness of breath. What changed: when to take this  Anoro Ellipta 62.5-25 MCG/ACT Aepb Generic drug: umeclidinium-vilanterol INHALE 1 PUFF INTO THE LUNGS DAILY What changed: when to take this  calcium-vitamin D 500-200 MG-UNIT tablet Commonly known as: OSCAL WITH D Take 1 tablet by mouth 2 (two) times daily. What changed: when to take this  torsemide 20 MG tablet Commonly known as: DEMADEX Take 4 tablets (80 mg total) by mouth every morning and 2 tablets ('40mg'$ ) in the evening. What changed: how much to take when to take this   STOP taking these  medications  STOP taking these medications amoxicillin 500 MG capsule Commonly known as: AMOXIL  dapagliflozin propanediol 10 MG Tabs tablet Commonly known as: Farxiga  metoprolol succinate 50 MG 24 hr tablet Commonly known as: TOPROL-XL  spironolactone 25 MG tablet Commonly known as: ALDACTONE   Medication changes verified by the patient? Yes (Yes/No)    Medication Accessibility:  Home Pharmacy: [facility] [address]   Was the patient provided with refills on discharged medications? Only on torsemide   Have all prescriptions been transferred from [facility] to home pharmacy? Yes   Is the patient interested in using a [place]? No  Medication Review:  APIXABAN (ELIQUIS)  Apixaban 5 mg BID initiated on 08/04/22 for Afib. - Discussed importance of taking medication around the same time every day.  - Advised patient of medications to avoid (NSAIDs, ASA maintenance doses>100 mg daily)  - Educated that Tylenol (acetaminophen) will be the preferred analgesic to prevent risk of bleeding. - Emphasized importance of monitoring for signs and symptoms of bleeding (abnormal bruising, prolonged bleeding, nose bleeds, bleeding from gums, discolored urine, black tarry stools)  - Advised patient to alert all providers of anticoagulation therapy prior to starting a new medication or having a procedure.  Follow-up Appointments:  PCP Hospital f/u appt confirmed? Yes Scheduled to see   08/18/2022 10:15 AM OPEN ESTABLISHED 30 min [facility] [place] [[id number]]    [facility] f/u appt confirmed? Yes Scheduled to see  08/11/2022 11:00 AM EST POST HOSP 30 min [facility] [[id number]]   Rodolph Bong, PharmD Candidate 08/10/2022  12:36 PM

## 2022-08-11 ENCOUNTER — Ambulatory Visit (HOSPITAL_COMMUNITY)
Admit: 2022-08-11 | Discharge: 2022-08-11 | Disposition: A | Discharge status: Home / Self Care | Payer: Medicare Other | Attending: Adult Health | Admitting: Adult Health

## 2022-08-11 VITALS — BP 160/100 | HR 82 | Wt 196.8 lb

## 2022-08-11 DIAGNOSIS — Z79899 Other long term (current) drug therapy: Secondary | ICD-10-CM | POA: Insufficient documentation

## 2022-08-11 DIAGNOSIS — Z720 Tobacco use: Secondary | ICD-10-CM | POA: Diagnosis not present

## 2022-08-11 DIAGNOSIS — E1136 Type 2 diabetes mellitus with diabetic cataract: Secondary | ICD-10-CM | POA: Diagnosis not present

## 2022-08-11 DIAGNOSIS — Z7901 Long term (current) use of anticoagulants: Secondary | ICD-10-CM | POA: Diagnosis not present

## 2022-08-11 DIAGNOSIS — I110 Hypertensive heart disease with heart failure: Secondary | ICD-10-CM | POA: Diagnosis present

## 2022-08-11 DIAGNOSIS — I10 Essential (primary) hypertension: Secondary | ICD-10-CM | POA: Diagnosis not present

## 2022-08-11 DIAGNOSIS — Z6836 Body mass index (BMI) 36.0-36.9, adult: Secondary | ICD-10-CM | POA: Insufficient documentation

## 2022-08-11 DIAGNOSIS — I509 Heart failure, unspecified: Secondary | ICD-10-CM

## 2022-08-11 DIAGNOSIS — I4892 Unspecified atrial flutter: Secondary | ICD-10-CM | POA: Insufficient documentation

## 2022-08-11 DIAGNOSIS — I5042 Chronic combined systolic (congestive) and diastolic (congestive) heart failure: Secondary | ICD-10-CM | POA: Diagnosis not present

## 2022-08-11 DIAGNOSIS — Z794 Long term (current) use of insulin: Secondary | ICD-10-CM

## 2022-08-11 DIAGNOSIS — I428 Other cardiomyopathies: Secondary | ICD-10-CM | POA: Insufficient documentation

## 2022-08-11 DIAGNOSIS — E669 Obesity, unspecified: Secondary | ICD-10-CM | POA: Diagnosis not present

## 2022-08-11 DIAGNOSIS — I480 Paroxysmal atrial fibrillation: Secondary | ICD-10-CM | POA: Insufficient documentation

## 2022-08-11 DIAGNOSIS — E119 Type 2 diabetes mellitus without complications: Secondary | ICD-10-CM

## 2022-08-11 DIAGNOSIS — N179 Acute kidney failure, unspecified: Secondary | ICD-10-CM | POA: Diagnosis not present

## 2022-08-11 DIAGNOSIS — I5022 Chronic systolic (congestive) heart failure: Secondary | ICD-10-CM | POA: Insufficient documentation

## 2022-08-11 LAB — BASIC METABOLIC PANEL
Anion gap: 14 (ref 5–15)
BUN: 26 mg/dL — ABNORMAL HIGH (ref 8–23)
CO2: 28 mmol/L (ref 22–32)
Calcium: 8.9 mg/dL (ref 8.9–10.3)
Chloride: 97 mmol/L — ABNORMAL LOW (ref 98–111)
Creatinine, Ser: 2.77 mg/dL — ABNORMAL HIGH (ref 0.44–1.00)
GFR, Estimated: 18 mL/min — ABNORMAL LOW (ref >60)
Glucose, Bld: 279 mg/dL — ABNORMAL HIGH (ref 70–99)
Potassium: 3.7 mmol/L (ref 3.5–5.1)
Sodium: 139 mmol/L (ref 135–145)

## 2022-08-11 LAB — CBC
HCT: 37.7 % (ref 36.0–46.0)
Hemoglobin: 11.8 g/dL — ABNORMAL LOW (ref 12.0–15.0)
MCH: 25.8 pg — ABNORMAL LOW (ref 26.0–34.0)
MCHC: 31.3 g/dL (ref 30.0–36.0)
MCV: 82.5 fL (ref 80.0–100.0)
Platelets: 369 10*3/uL (ref 150–400)
RBC: 4.57 MIL/uL (ref 3.87–5.11)
RDW: 18.1 % — ABNORMAL HIGH (ref 11.5–15.5)
WBC: 10.2 10*3/uL (ref 4.0–10.5)
nRBC: 0 % (ref 0.0–0.2)

## 2022-08-11 LAB — BRAIN NATRIURETIC PEPTIDE: B Natriuretic Peptide: 325 pg/mL — ABNORMAL HIGH (ref 0.0–100.0)

## 2022-08-11 MED ORDER — ISOSORB DINITRATE-HYDRALAZINE 20-37.5 MG PO TABS: 1.0000 | ORAL_TABLET | Freq: Three times a day (TID) | ORAL | 3 refills | Status: DC

## 2022-08-11 MED ORDER — AMIODARONE HCL 200 MG PO TABS: 200.0000 mg | ORAL_TABLET | Freq: Every day | ORAL | 0 refills | Status: DC

## 2022-08-11 NOTE — Patient Instructions (Signed)
Please take and record your blood pressure every morning  Routine lab work today. Will notify you of abnormal results  Decrease Amiodarone to '200mg'$  daily  START Bidil 1 tablet three times daily  Follow up in 3 weeks  Do the following things EVERYDAY: Weigh yourself in the morning before breakfast. Write it down and keep it in a log. Take your medicines as prescribed Eat low salt foods--Limit salt (sodium) to 2000 mg per day.  Stay as active as you can everyday Limit all fluids for the day to less than 2 liters

## 2022-08-11 NOTE — Progress Notes (Addendum)
PCP: Dr Map  HF MD:  Dr Haroldine Laws   HPI: [name] is a 71 year old with a history of DMII, COPD, HTN, chronic respiratory failure, tobacco abuse, [employer] Jude ICD, and chronic HFrEF. EF has 25-30% for at least 5 years. Last LHC 2017, normal coronaries.    Admitted 06/2021 with A/C HFrEF. Started on IV lasix. Echo showed EF 20-25% and normal RV. AHF consulted for management, some concern that she may have low output. R/LHC deferred as symptoms improved with diuresis. GDMT was limited by elevated potassium   Admitted 9/23 with sepsis 2/2 cellulitis of RLE. ID consulted. Underwent TEE to r/o endocarditis. TEE (9/23) showed LVEF 30%, moderate TR, no valvular vegetation or device vegetation. Admitted a couple weeks later with hypothermia and hypoglycemia. Insulin stopped and SGLT2i continued. Entresto and spiro held due to low BP.   Admitted with A/C HFrEF , AECOPD, and new onset A flutter RVR. Given steroinds + inhalers. Started on amio drip + heparin drip. Diuresed with IV lasix.  Hospital course complicated by AKI. Renal US negative for hydronephrosis. Had [place] prior to discharged. Low filing pressures and CI 2.5. Placed on torsemide 40/40. GDMT limited by AKI. Discharged 08/04/22. Discharge weight 197 pounds.   Today she returns for HF follow up.Overall feeling much better. Mild shortness of breath with exertion. Denies PND/Orthopnea. Able to walk to the mailbox. Appetite ok. No fever or chills. Weight at home 191-192 pounds. Smoking 1/2 PPD. Taking all medications but did not take her medications prior to visit due to increased urine output. She has been taking torsemide 40 mg twice a day and restarted farxiga after discharge. Lives alone.   Cardiac Testing  TEE (9/23): EF 30%, moderate TR, no valvular vegetation or device vegetation.    - Echo (07/2022): EF 25-30% RV low normal. RA/LA severely dilated. D Sahped septum - Echo (9/22): EF 20-25% RV normal , LA severely reduced  - Echo (2018): EF 25-30%  Grade IDD   -RHC 08/04/2022  RA = 9 RV = 55/10 PA = 44/11 (23) PCW = 9 Fick cardiac output/index = 6.1/3.2 Thermo CO/CI = 4.8/2.5 PVR = 2.3 (Fick) 2.9 (TD) Ao sat = 95% PA sat = 67%, 67% PAPi = 5.0  - LHC 2017 Normal Coronaries.     ROS: All systems negative except as listed in HPI, PMH and Problem List.  SH:  Social History   Socioeconomic History   Marital status: Significant Other    Spouse name: Not on file   Number of children: 3   Years of education: 82   Highest education level: 11th grade  Occupational History   Occupation: disabled  Tobacco Use   Smoking status: Every Day    Packs/day: 0.50    Years: 45.00    Total pack years: 22.50    Types: Cigarettes   Smokeless tobacco: Never   Tobacco comments:    currently smoking 2 cigs per day  Vaping Use   Vaping Use: Never used  Substance and Sexual Activity   Alcohol use: Not Currently    Alcohol/week: 3.0 standard drinks of alcohol    Types: 3 Shots of liquor per week   Drug use: No   Sexual activity: Yes    Partners: Male    Birth control/protection: Surgical  Other Topics Concern   Not on file  Social History Narrative   ** Merged History Encounter **       Married   Patient enjoys going to [employer],  church and spending time with family   Social Determinants of Health   Financial Resource Strain: Low Risk  (06/16/2021)   Overall Financial Resource Strain (CARDIA)    Difficulty of Paying Living Expenses: Not very hard  Food Insecurity: No Food Insecurity (06/27/2022)   Hunger Vital Sign    Worried About Running Out of Food in the Last Year: Never true    Ran Out of Food in the Last Year: Never true  Transportation Needs: No Transportation Needs (06/27/2022)   PRAPARE - Hydrologist (Medical): No    Lack of Transportation (Non-Medical): No  Physical Activity: Not on file  Stress: Not on file  Social Connections: Not on file  Intimate Partner Violence: Not At Risk  (06/27/2022)   Humiliation, Afraid, Rape, and Kick questionnaire    Fear of Current or Ex-Partner: No    Emotionally Abused: No    Physically Abused: No    Sexually Abused: No    FH:  Family History  Problem Relation Age of Onset   Stroke Mother    Seizures Father    Heart disease Father    Diabetes Sister    Asthma Maternal Aunt        aunts   Asthma Maternal Uncle        uncles   Heart disease Paternal Aunt        aunts   Heart disease Paternal Uncle        uncles   Heart disease Maternal Aunt        aunts   Heart disease Maternal Uncle        uncles   Heart disease Maternal Grandfather    Breast cancer Daughter    Breast cancer Cousin    Asthma Grandchild    Colon cancer Neg Hx    Colon polyps Neg Hx    Esophageal cancer Neg Hx    Kidney disease Neg Hx    Gallbladder disease Neg Hx     Past Medical History:  Diagnosis Date   ATN (acute tubular necrosis) ([place]) 07/15/2014   Automatic implantable cardioverter-defibrillator in situ    Automatic implantable cardioverter-defibrillator in situ 05/04/2007   Qualifier: Diagnosis of  By: Isla Pence     Cardiac defibrillator in situ    Atlas II VR (SJM) implanted by Dr Lovena Le   CHF (congestive heart failure) ([place])    Chronic combined systolic and diastolic heart failure ([place])    a. EF 35-40% in past;  b. Echo 7/13:  EF 45-50%, Gr 2 diast dysfn, mild AI, mild MAC, trivial MR, mild LAE, PASP 47.   Chronic ulcer of leg ([place])    04-09-15 resolved-not a problem.   Colon polyps 04/12/2013   Rectosigmoid polyp   COPD (chronic obstructive pulmonary disease) (HCC)    O2 at night   Depression    Diabetes mellitus    Diabetes mellitus, type 2 ([place]) 04/03/2012   HIGH RISK FEET.. Please have patient take shoes and socks off every visit for visual foot inspection.     Eczema    Elevated alkaline phosphatase level    GGT and 5'nucleotidase 8/13 normal   Health care maintenance 01/22/2013   Surgically absent cervix- no pap needed  (Path report 07/2000: uterine body with attached bilateral  adnexa and separate cervix.)   History of oxygen administration    oxygen @ 2 l/m nasally bedtime 24/7   HTN (hypertension)    Hx of cardiac cath  a. [place] 2003 normal;  b. LHC 6/13:  Mild calcification in the LM, o/w normal coronary arteries, EF 45%.    Hyperkalemia 08/08/2017   Hyperlipidemia    Elevated triglyceride in 2019   Hyperlipidemia associated with type 2 diabetes mellitus ([place]) 05/25/2007   Qualifier: Diagnosis of  By: Hassell Done FNP, [place]     Hyperthyroidism, subclinical    HYPERTHYROIDISM, SUBCLINICAL 05/06/2009   Qualifier: Diagnosis of  By: Darrick Meigs     Implantable cardioverter-defibrillator (ICD) generator end of life    Lipoma    Need for hepatitis C screening test 04/25/2019   NICM (nonischemic cardiomyopathy) ([place])    Obesity    On home oxygen therapy    "2L; 24/7" (10/11/2016)   Post menopausal syndrome    Shortness of breath 07/14/2017   Skin rash 07/12/2018   Sleep apnea    pt denies 04/12/2013    Current Outpatient Medications  Medication Sig Dispense Refill   albuterol (VENTOLIN HFA) 108 (90 Base) MCG/ACT inhaler Inhale 2 puffs into the lungs every 6 (six) hours as needed for wheezing or shortness of breath. (Patient taking differently: Inhale 2 puffs into the lungs daily as needed for wheezing or shortness of breath.) 8 g 2   amiodarone (PACERONE) 200 MG tablet Take 1 tablet (200 mg total) by mouth 2 (two) times daily. 60 tablet 0   ANORO ELLIPTA 62.5-25 MCG/ACT AEPB INHALE 1 PUFF INTO THE LUNGS DAILY (Patient taking differently: Inhale 1 puff into the lungs in the morning.) 60 each 2   apixaban (ELIQUIS) 5 MG TABS tablet Take 1 tablet (5 mg total) by mouth 2 (two) times daily. 60 tablet 0   aspirin EC 81 MG tablet Take 81 mg by mouth daily. Swallow whole.     atorvastatin (LIPITOR) 80 MG tablet Take 80 mg by mouth at bedtime.     calcium-vitamin D (OSCAL WITH D) 500-200 MG-UNIT tablet Take 1  tablet by mouth 2 (two) times daily. (Patient taking differently: Take 1 tablet by mouth at bedtime.) 90 tablet 6   dapagliflozin propanediol (FARXIGA) 10 MG TABS tablet Take 10 mg by mouth daily.     dupilumab (DUPIXENT) 200 MG/1.14ML prefilled syringe Inject 200 mg into the skin every 14 (fourteen) days. Every other Friday     Dupilumab (DUPIXENT) 300 MG/2ML SOPN Inject 300 mg into the skin every 14 (fourteen) days.     OVER THE COUNTER MEDICATION Apply 1 Application topically in the morning and at bedtime. OTC Cream     potassium chloride SA (KLOR-CON M) 20 MEQ tablet Take 1 tablet (20 mEq total) by mouth daily. 90 tablet 3   torsemide (DEMADEX) 20 MG tablet Take 4 tablets (80 mg total) by mouth every morning and 2 tablets (89m) in the evening. 180 tablet 0   No current facility-administered medications for this encounter.    Vitals:   08/11/22 1048  BP: (!) 160/100  Pulse: 82  SpO2: 92%  Weight: 89.3 kg (196 lb 12.8 oz)   Wt Readings from Last 3 Encounters:  08/11/22 89.3 kg (196 lb 12.8 oz)  08/04/22 89.4 kg (197 lb 1.6 oz)  07/13/22 100.7 kg (222 lb)    PHYSICAL EXAM: General: Arrived in a wheel chair. No resp difficulty HEENT: normal Neck: supple. JVP flat. Carotids 2+ bilaterally; no bruits. No lymphadenopathy or thryomegaly appreciated. Cor: PMI normal. Regular rate & rhythm. No rubs, gallops or murmurs. Lungs: clear Abdomen: soft, nontender, nondistended. No hepatosplenomegaly. No bruits or masses. Good  bowel sounds. Extremities: no cyanosis, clubbing, rash, R and LLE 1+edema. I removed Unna boots. No open wounds.  Neuro: alert & orientedx3, cranial nerves grossly intact. Moves all 4 extremities w/o difficulty. Affect pleasant.   ECG: NSR 83 bpm personally checked.    ASSESSMENT & PLAN:   1. Chronic HFrEF  - NICM LHC 2017 normal cors. - 2022 Echo EF 25-30% which has been down for many years. Echo 07/2022  EF25-30%, D shaped LV, RV mildly dilated w/ mod elevated  RVSP, RV systolic fx mildly reduced, severe BAE, severe TR, mild-mod MR, IVC dilated, assumed RAP 15  - s/p STJ ICD - NYHA III. Volume status looks ok. Continue torsemide 40 mg twice a day.  - No spiro or entresto due to AKI. Creatinine>3 at last check.  - Continue farxiga 10 mg daily - Add Bidil 1 tab three times a day .  - Check BMET BNP today - Can leave unna boots off. Recommend compression stockings. Provided with script.   2. PAF - Of note had TEE 06/2022- no thrombus noted.   - Back in A flutter 07/2022. Chemically converted with IV amio.  - Cut back amio to 200 mg daily  - Continue eliquis 5 mg twice a day.  - Check CBC  - set up sleep study next visit.     3.H/O AKI  -Recent hospitalization creatinine on admit was up to 3.15 from previous Creatinine baseline ~ 1.2 . Denies NSAIDs use.  -07/2022  Renal u/s- no hydronephrosis - Check BMET     4.  DMII -On SGLT2i - On statin - Regimen per PCO  5. Obesity Body mass index is 36 kg/m.  6. Hypertension -Elevated but did not take meds today. - Add Bidil 1 tablet three times a day.   7. Tobacco Abuse Discussed cessation  Discussed HF Paramedicine referral however she declined. I am a little concerned about her medications and have asked her to bring all medications to her follow up.    Nayzeth Altman NP-C  12:23 PM

## 2022-08-18 ENCOUNTER — Ambulatory Visit: Payer: Medicare Other

## 2022-08-18 ENCOUNTER — Encounter: Payer: Self-pay | Admitting: Student

## 2022-08-18 ENCOUNTER — Ambulatory Visit (INDEPENDENT_AMBULATORY_CARE_PROVIDER_SITE_OTHER): Payer: Medicare Other | Admitting: Student

## 2022-08-18 ENCOUNTER — Other Ambulatory Visit: Payer: Self-pay

## 2022-08-18 VITALS — BP 127/60 | HR 78 | Temp 97.5°F | Ht 64.0 in | Wt 192.2 lb

## 2022-08-18 DIAGNOSIS — Z794 Long term (current) use of insulin: Secondary | ICD-10-CM

## 2022-08-18 DIAGNOSIS — E1165 Type 2 diabetes mellitus with hyperglycemia: Secondary | ICD-10-CM

## 2022-08-18 DIAGNOSIS — I4892 Unspecified atrial flutter: Secondary | ICD-10-CM

## 2022-08-18 DIAGNOSIS — Z0000 Encounter for general adult medical examination without abnormal findings: Secondary | ICD-10-CM | POA: Insufficient documentation

## 2022-08-18 DIAGNOSIS — Z23 Encounter for immunization: Secondary | ICD-10-CM

## 2022-08-18 DIAGNOSIS — I5022 Chronic systolic (congestive) heart failure: Secondary | ICD-10-CM | POA: Diagnosis not present

## 2022-08-18 DIAGNOSIS — F17210 Nicotine dependence, cigarettes, uncomplicated: Secondary | ICD-10-CM

## 2022-08-18 DIAGNOSIS — I110 Hypertensive heart disease with heart failure: Secondary | ICD-10-CM

## 2022-08-18 DIAGNOSIS — Z7901 Long term (current) use of anticoagulants: Secondary | ICD-10-CM

## 2022-08-18 DIAGNOSIS — I10 Essential (primary) hypertension: Secondary | ICD-10-CM

## 2022-08-18 MED ORDER — OZEMPIC (0.25 OR 0.5 MG/DOSE) 2 MG/1.5ML ~~LOC~~ SOPN: 0.2500 mg | PEN_INJECTOR | SUBCUTANEOUS | 1 refills | Status: DC

## 2022-08-18 MED ORDER — PEN NEEDLES 32G X 4 MM MISC: 1.0000 | 1 refills | Status: DC

## 2022-08-18 MED ORDER — APIXABAN 2.5 MG PO TABS: 2.5000 mg | ORAL_TABLET | Freq: Two times a day (BID) | ORAL | 2 refills | Status: DC

## 2022-08-18 MED ORDER — DAPAGLIFLOZIN PROPANEDIOL 10 MG PO TABS: 10.0000 mg | ORAL_TABLET | Freq: Every day | ORAL | 1 refills | Status: DC

## 2022-08-18 MED ORDER — AMIODARONE HCL 200 MG PO TABS: 200.0000 mg | ORAL_TABLET | Freq: Every day | ORAL | 1 refills | Status: DC

## 2022-08-18 NOTE — Assessment & Plan Note (Signed)
Patient with chronic systolic HF (EF 25% on TEE 06/2022) s/p ICD placement. She was recently hospitalized for acute exacerbation and was diuresed with IV lasix. RHC showed low filling pressures and cardiac index of 2.5. She was ultimately discharged with torsemide '40mg'$  BID, Klor 55mq daily. GDMT was limited by kidney function and thus she was on farxiga alone at discharge. Her discharge weight was 197 lbs. She had a f/u visit with the HF team where BiDil (1 tablet TID) was added to her regimen. Her weight today is 192 lbs (same as during HF visit). She is feeling much better now and is getting closer to her baseline. Her BP is 127/60 today.  On exam, she has trace pitting edema in bilateral LE but no crackles of appreciable JVD noted.   Plan: -continue torsemide '40mg'$  BID -continue Klor 252m daily -continue farxiga and BiDil -f/u in 1 month

## 2022-08-18 NOTE — Assessment & Plan Note (Signed)
She was recently hospitalized and found to have new-onset atrial flutter. She was started on an amiodarone drip and heparin drip. She had a recent TEE in 06/2022 that did not show any thrombus. She was ultimately discharged with amiodarone '200mg'$  BID and eliquis '5mg'$  BID. At recent HF visit following hospitalization, her amiodarone was down-titrated to '200mg'$  daily.   Of note, patient does now have CKD IV with a creatinine clearance of 21. She is reporting increasing bleeding especially with fingerstick glucose checks (reporting fingerstick bleeding lasting for about 1-2 hours despite attempts to stop bleeding). She is also on aspirin therapy given extensive heart disease. Given this, we will decrease her eliquis dose to 2.'5mg'$  BID and continue aspirin.   Plan: -continue amiodarone '200mg'$  daily -decreased eliquis to 2.'5mg'$  BID -continue aspirin '81mg'$  daily -f/u in 1 month

## 2022-08-18 NOTE — Assessment & Plan Note (Signed)
Received flu shot and prevnar vaccine today.

## 2022-08-18 NOTE — Addendum Note (Signed)
Addended by: Velora Heckler on: 08/18/2022 11:38 AM   Modules accepted: Orders

## 2022-08-18 NOTE — Patient Instructions (Addendum)
[name],  It was a pleasure seeing you in the clinic today.   We have made the following changes today. Please take amiodarone only once a day. I have decreased your dose of eliquis to 2.'5mg'$  twice a day and I sent the new prescription to your pharmacy. I hope this helps with reducing how much you are bleeding when checking your sugar levels. I have refilled your farxiga. We are starting a new medicine to help with your diabetes. It is called Ozempic and it is a once-a-week injection medicine. I have sent the prescription to your pharmacy along with pen needles. Please ask the pharmacy to show you how to use it. You got your flu shot and pneumonia shot today. Please come back to see [place] in 1 month for your A1c check.  Please call our clinic at [phone] if you have any questions or concerns. The best time to call is Monday-Friday from 9am-4pm, but there is someone available 24/7 at the same number. If you need medication refills, please notify your pharmacy one week in advance and they will send [place] a request.   Thank you for letting us take part in your care. We look forward to seeing you next time!

## 2022-08-18 NOTE — Progress Notes (Signed)
CC: f/u HF, AF, T2DM  HPI:  [name] is a 71 y.o. female with history listed below presenting to the [facility] for f/u HF, AF, T2DM. Please see individualized problem based charting for full HPI.  Past Medical History:  Diagnosis Date   ATN (acute tubular necrosis) ([place]) 07/15/2014   Automatic implantable cardioverter-defibrillator in situ    Automatic implantable cardioverter-defibrillator in situ 05/04/2007   Qualifier: Diagnosis of  By: Isla Pence     Cardiac defibrillator in situ    Atlas II VR (SJM) implanted by Dr Lovena Le   CHF (congestive heart failure) ([place])    Chronic combined systolic and diastolic heart failure ([place])    a. EF 35-40% in past;  b. Echo 7/13:  EF 45-50%, Gr 2 diast dysfn, mild AI, mild MAC, trivial MR, mild LAE, PASP 47.   Chronic ulcer of leg ([place])    04-09-15 resolved-not a problem.   Colon polyps 04/12/2013   Rectosigmoid polyp   COPD (chronic obstructive pulmonary disease) (HCC)    O2 at night   Depression    Diabetes mellitus    Diabetes mellitus, type 2 ([place]) 04/03/2012   HIGH RISK FEET.. Please have patient take shoes and socks off every visit for visual foot inspection.     Eczema    Elevated alkaline phosphatase level    GGT and 5'nucleotidase 8/13 normal   Health care maintenance 01/22/2013   Surgically absent cervix- no pap needed (Path report 07/2000: uterine body with attached bilateral  adnexa and separate cervix.)   History of oxygen administration    oxygen @ 2 l/m nasally bedtime 24/7   HTN (hypertension)    Hx of cardiac cath    a. [place] 2003 normal;  b. LHC 6/13:  Mild calcification in the LM, o/w normal coronary arteries, EF 45%.    Hyperkalemia 08/08/2017   Hyperlipidemia    Elevated triglyceride in 2019   Hyperlipidemia associated with type 2 diabetes mellitus ([place]) 05/25/2007   Qualifier: Diagnosis of  By: Hassell Done FNP, Nykedtra     Hyperthyroidism, subclinical    HYPERTHYROIDISM, SUBCLINICAL 05/06/2009   Qualifier: Diagnosis of   By: Darrick Meigs     Implantable cardioverter-defibrillator (ICD) generator end of life    Lipoma    Need for hepatitis C screening test 04/25/2019   NICM (nonischemic cardiomyopathy) ([place])    Obesity    On home oxygen therapy    "2L; 24/7" (10/11/2016)   Post menopausal syndrome    Shortness of breath 07/14/2017   Skin rash 07/12/2018   Sleep apnea    pt denies 04/12/2013    Review of Systems:  Negative aside from that listed in individualized problem based charting.  Physical Exam:  Vitals:   08/18/22 1000  Weight: 192 lb 3.2 oz (87.2 kg)  Height: _0  (1.626 m)   Physical Exam Constitutional:      Appearance: She is obese.     Comments: Chronically ill-appearing  HENT:     Mouth/Throat:     Mouth: Mucous membranes are moist.     Pharynx: Oropharynx is clear.  Eyes:     Extraocular Movements: Extraocular movements intact.     Conjunctiva/sclera: Conjunctivae normal.  Cardiovascular:     Rate and Rhythm: Normal rate and regular rhythm.     Comments: No appreciable JVD Pulmonary:     Effort: Pulmonary effort is normal.     Breath sounds: Normal breath sounds. No wheezing, rhonchi or rales.  Abdominal:  General: Bowel sounds are normal. There is no distension.     Palpations: Abdomen is soft.     Tenderness: There is no abdominal tenderness.  Musculoskeletal:        General: Normal range of motion.     Comments: Trace pitting edema in bilateral lower extremities  Skin:    General: Skin is warm and dry.  Neurological:     Mental Status: She is alert and oriented to person, place, and time. Mental status is at baseline.  Psychiatric:        Mood and Affect: Mood normal.        Behavior: Behavior normal.      Assessment & Plan:   See Encounters Tab for problem based charting.  Patient discussed with Dr. Philipp Ovens

## 2022-08-18 NOTE — Assessment & Plan Note (Signed)
BP in clinic today is 127/60. She is on farxiga '10mg'$  daily, torsemide '40mg'$  BID, and recently started BiDil (1 tablet TID). Doing well on current regimen.  -continue current medications

## 2022-08-18 NOTE — Assessment & Plan Note (Signed)
Patient with history of uncontrolled T2DM, last A1c of 9.7% 2 months ago. She was previously on long-acting insulin (degludec) and meal-time insulin (humalog) but these were stopped due to hypoglycemia and related falls. Her victoza was also discontinued for this same reason. She has only been on farxiga alone since this time. Review of her glucometer reveals an average BG of 297 (highest 461 and lowest 93). Doubt that she will have improvement in her A1c with farxiga alone. Given prior history of hypoglycemia with insulin therapy and concomitant HF, will start her on ozempic (once weekly dosing) to help better control her diabetes. Given her extensive comorbid conditions, it is reasonable to liberalize her A1c goal to < 8% to prevent intensive glycemic control and precipitation of hypoglycemia.   Plan: -continue farxiga -start ozempic 0.'25mg'$  weekly, with goal to up-titrate monthly as tolerated -f/u in 1 month for repeat A1c (new goal of <8%)

## 2022-08-19 MED ORDER — OZEMPIC (0.25 OR 0.5 MG/DOSE) 2 MG/1.5ML ~~LOC~~ SOPN: 0.2500 mg | PEN_INJECTOR | SUBCUTANEOUS | 1 refills | Status: DC

## 2022-08-19 NOTE — Addendum Note (Signed)
Addended by: Virl Axe on: 08/19/2022 11:13 AM   Modules accepted: Orders

## 2022-08-22 NOTE — Addendum Note (Signed)
Addended by: Jodean Lima on: 08/22/2022 08:17 AM   Modules accepted: Level of Service

## 2022-08-22 NOTE — Progress Notes (Signed)
Internal Medicine Clinic Attending  Case discussed with Dr. Jinwala  At the time of the visit.  We reviewed the resident's history and exam and pertinent patient test results.  I agree with the assessment, diagnosis, and plan of care documented in the resident's note.  

## 2022-09-05 ENCOUNTER — Telehealth: Payer: Self-pay | Admitting: Internal Medicine

## 2022-09-05 NOTE — Telephone Encounter (Signed)
Pt called to report that over the past 3 days she has been having pain from her ICD radiating to her left shoulder... she says it hurts with movement and hurts when she presses on her ICD site... it is not red or warm but she says it is swollen. She says she has lost al lot of weight and it seems to be sticking out more than usual but she also thinks it is swollen.   She denies chest pain.   I advised her that I will forward to our device nurses.    She also has an appt tomorrow in the HF clinic. I will forward to them as well to see if they can assess her site if the device nurses think it is appropriate to wait until then.   Pt has appt with Dr Lovena Le 09/12/22.

## 2022-09-05 NOTE — Progress Notes (Signed)
PCP: Dr Map  HF MD:  Dr Haroldine Laws   HPI: [name] is a 71 year old with a history of DMII, COPD, HTN, chronic respiratory failure, tobacco abuse, St Jude ICD, and chronic HFrEF. EF has 25-30% for at least 5 years. Last LHC 2017, normal coronaries.    Admitted 06/2021 with A/C HFrEF. Started on IV lasix. Echo showed EF 20-25% and normal RV. AHF consulted for management, some concern that she may have low output. R/LHC deferred as symptoms improved with diuresis. GDMT was limited by elevated potassium   Admitted 9/23 with sepsis 2/2 cellulitis of RLE. ID consulted. Underwent TEE to r/o endocarditis. TEE (9/23) showed LVEF 30%, moderate TR, no valvular vegetation or device vegetation. Admitted a couple weeks later with hypothermia and hypoglycemia. Insulin stopped and SGLT2i continued. Entresto and spiro held due to low BP.   Admitted with A/C HFrEF , AECOPD, and new onset A flutter RVR. Given steroinds + inhalers. Started on amio drip + heparin drip. Diuresed with IV lasix.  Hospital course complicated by AKI. Renal US negative for hydronephrosis. Had [place] prior to discharged. Low filing pressures and CI 2.5. Placed on torsemide 40/40. GDMT limited by AKI. Discharged 08/04/22. Discharge weight 197 pounds.   Today she returns for HF follow up.Overall feeling much better. Mild shortness of breath with exertion. Denies PND/Orthopnea. Able to walk to the mailbox. Appetite ok. No fever or chills. Weight at home 191-192 pounds. Smoking 1/2 PPD. Taking all medications but did not take her medications prior to visit due to increased urine output. She has been taking torsemide 40 mg twice a day and restarted farxiga after discharge. Lives alone.   Cardiac Testing  TEE (9/23): EF 30%, moderate TR, no valvular vegetation or device vegetation.    - Echo (07/2022): EF 25-30% RV low normal. RA/LA severely dilated. D Sahped septum - Echo (9/22): EF 20-25% RV normal , LA severely reduced  - Echo (2018): EF 25-30%  Grade IDD   -RHC 08/04/2022  RA = 9 RV = 55/10 PA = 44/11 (23) PCW = 9 Fick cardiac output/index = 6.1/3.2 Thermo CO/CI = 4.8/2.5 PVR = 2.3 (Fick) 2.9 (TD) Ao sat = 95% PA sat = 67%, 67% PAPi = 5.0  - LHC 2017 Normal Coronaries.     ROS: All systems negative except as listed in HPI, PMH and Problem List.  SH:  Social History   Socioeconomic History   Marital status: Significant Other    Spouse name: Not on file   Number of children: 3   Years of education: 82   Highest education level: 11th grade  Occupational History   Occupation: disabled  Tobacco Use   Smoking status: Every Day    Packs/day: 0.50    Years: 45.00    Total pack years: 22.50    Types: Cigarettes   Smokeless tobacco: Never   Tobacco comments:    currently smoking 2 cigs per day  Vaping Use   Vaping Use: Never used  Substance and Sexual Activity   Alcohol use: Not Currently    Alcohol/week: 3.0 standard drinks of alcohol    Types: 3 Shots of liquor per week   Drug use: No   Sexual activity: Yes    Partners: Male    Birth control/protection: Surgical  Other Topics Concern   Not on file  Social History Narrative   ** Merged History Encounter **       Married   Patient enjoys going to [employer],  church and spending time with family   Social Determinants of Health   Financial Resource Strain: Low Risk  (06/16/2021)   Overall Financial Resource Strain (CARDIA)    Difficulty of Paying Living Expenses: Not very hard  Food Insecurity: No Food Insecurity (06/27/2022)   Hunger Vital Sign    Worried About Running Out of Food in the Last Year: Never true    Ran Out of Food in the Last Year: Never true  Transportation Needs: No Transportation Needs (06/27/2022)   PRAPARE - Hydrologist (Medical): No    Lack of Transportation (Non-Medical): No  Physical Activity: Not on file  Stress: Not on file  Social Connections: Not on file  Intimate Partner Violence: Not At Risk  (06/27/2022)   Humiliation, Afraid, Rape, and Kick questionnaire    Fear of Current or Ex-Partner: No    Emotionally Abused: No    Physically Abused: No    Sexually Abused: No    FH:  Family History  Problem Relation Age of Onset   Stroke Mother    Seizures Father    Heart disease Father    Diabetes Sister    Asthma Maternal Aunt        aunts   Asthma Maternal Uncle        uncles   Heart disease Paternal Aunt        aunts   Heart disease Paternal Uncle        uncles   Heart disease Maternal Aunt        aunts   Heart disease Maternal Uncle        uncles   Heart disease Maternal Grandfather    Breast cancer Daughter    Breast cancer Cousin    Asthma Grandchild    Colon cancer Neg Hx    Colon polyps Neg Hx    Esophageal cancer Neg Hx    Kidney disease Neg Hx    Gallbladder disease Neg Hx     Past Medical History:  Diagnosis Date   ATN (acute tubular necrosis) ([place]) 07/15/2014   Automatic implantable cardioverter-defibrillator in situ    Automatic implantable cardioverter-defibrillator in situ 05/04/2007   Qualifier: Diagnosis of  By: Isla Pence     Cardiac defibrillator in situ    Atlas II VR (SJM) implanted by Dr Lovena Le   CHF (congestive heart failure) ([place])    Chronic combined systolic and diastolic heart failure ([place])    a. EF 35-40% in past;  b. Echo 7/13:  EF 45-50%, Gr 2 diast dysfn, mild AI, mild MAC, trivial MR, mild LAE, PASP 47.   Chronic ulcer of leg ([place])    04-09-15 resolved-not a problem.   Colon polyps 04/12/2013   Rectosigmoid polyp   COPD (chronic obstructive pulmonary disease) (HCC)    O2 at night   Depression    Diabetes mellitus    Diabetes mellitus, type 2 ([place]) 04/03/2012   HIGH RISK FEET.. Please have patient take shoes and socks off every visit for visual foot inspection.     Eczema    Elevated alkaline phosphatase level    GGT and 5'nucleotidase 8/13 normal   Health care maintenance 01/22/2013   Surgically absent cervix- no pap needed  (Path report 07/2000: uterine body with attached bilateral  adnexa and separate cervix.)   History of oxygen administration    oxygen @ 2 l/m nasally bedtime 24/7   HTN (hypertension)    Hx of cardiac cath  a. [place] 2003 normal;  b. LHC 6/13:  Mild calcification in the LM, o/w normal coronary arteries, EF 45%.    Hyperkalemia 08/08/2017   Hyperlipidemia    Elevated triglyceride in 2019   Hyperlipidemia associated with type 2 diabetes mellitus ([place]) 05/25/2007   Qualifier: Diagnosis of  By: Hassell Done FNP, Nykedtra     Hyperthyroidism, subclinical    HYPERTHYROIDISM, SUBCLINICAL 05/06/2009   Qualifier: Diagnosis of  By: Darrick Meigs     Implantable cardioverter-defibrillator (ICD) generator end of life    Lipoma    Need for hepatitis C screening test 04/25/2019   NICM (nonischemic cardiomyopathy) ([place])    Obesity    On home oxygen therapy    "2L; 24/7" (10/11/2016)   Post menopausal syndrome    Shortness of breath 07/14/2017   Skin rash 07/12/2018   Sleep apnea    pt denies 04/12/2013    Current Outpatient Medications  Medication Sig Dispense Refill   albuterol (VENTOLIN HFA) 108 (90 Base) MCG/ACT inhaler Inhale 2 puffs into the lungs every 6 (six) hours as needed for wheezing or shortness of breath. (Patient taking differently: Inhale 2 puffs into the lungs daily as needed for wheezing or shortness of breath.) 8 g 2   amiodarone (PACERONE) 200 MG tablet Take 1 tablet (200 mg total) by mouth daily. 90 tablet 1   ANORO ELLIPTA 62.5-25 MCG/ACT AEPB INHALE 1 PUFF INTO THE LUNGS DAILY (Patient taking differently: Inhale 1 puff into the lungs in the morning.) 60 each 2   apixaban (ELIQUIS) 2.5 MG TABS tablet Take 1 tablet (2.5 mg total) by mouth 2 (two) times daily. 120 tablet 2   aspirin EC 81 MG tablet Take 81 mg by mouth daily. Swallow whole.     atorvastatin (LIPITOR) 80 MG tablet Take 80 mg by mouth at bedtime.     calcium-vitamin D (OSCAL WITH D) 500-200 MG-UNIT tablet Take 1 tablet by  mouth 2 (two) times daily. (Patient taking differently: Take 1 tablet by mouth at bedtime.) 90 tablet 6   dapagliflozin propanediol (FARXIGA) 10 MG TABS tablet Take 1 tablet (10 mg total) by mouth daily. 90 tablet 1   dupilumab (DUPIXENT) 200 MG/1.14ML prefilled syringe Inject 200 mg into the skin every 14 (fourteen) days. Every other Friday     Dupilumab (DUPIXENT) 300 MG/2ML SOPN Inject 300 mg into the skin every 14 (fourteen) days.     Insulin Pen Needle (PEN NEEDLES) 32G X 4 MM MISC 1 Needle by Does not apply route once a week. 100 each 1   isosorbide-hydrALAZINE (BIDIL) 20-37.5 MG tablet Take 1 tablet by mouth 3 (three) times daily. 90 tablet 3   OVER THE COUNTER MEDICATION Apply 1 Application topically in the morning and at bedtime. OTC Cream     potassium chloride SA (KLOR-CON M) 20 MEQ tablet Take 1 tablet (20 mEq total) by mouth daily. 90 tablet 3   Semaglutide,0.25 or 0.5MG/DOS, (OZEMPIC, 0.25 OR 0.5 MG/DOSE,) 2 MG/1.5ML SOPN Inject 0.25 mg into the skin once a week. 3 mL 1   torsemide (DEMADEX) 20 MG tablet Take 4 tablets (80 mg total) by mouth every morning and 2 tablets (78m) in the evening. (Patient taking differently: Take 40 mg by mouth 2 (two) times daily.) 180 tablet 0   No current facility-administered medications for this visit.    There were no vitals filed for this visit.  Wt Readings from Last 3 Encounters:  08/18/22 87.2 kg (192 lb 3.2 oz)  08/11/22 89.3  kg (196 lb 12.8 oz)  08/04/22 89.4 kg (197 lb 1.6 oz)    PHYSICAL EXAM: General: Arrived in a wheel chair. No resp difficulty HEENT: normal Neck: supple. JVP flat. Carotids 2+ bilaterally; no bruits. No lymphadenopathy or thryomegaly appreciated. Cor: PMI normal. Regular rate & rhythm. No rubs, gallops or murmurs. Lungs: clear Abdomen: soft, nontender, nondistended. No hepatosplenomegaly. No bruits or masses. Good bowel sounds. Extremities: no cyanosis, clubbing, rash, R and LLE 1+edema. I removed Unna boots. No  open wounds.  Neuro: alert & orientedx3, cranial nerves grossly intact. Moves all 4 extremities w/o difficulty. Affect pleasant.   ECG: NSR 83 bpm personally checked.    ASSESSMENT & PLAN:   1. Chronic HFrEF  - NICM LHC 2017 normal cors. - 2022 Echo EF 25-30% which has been down for many years. Echo 07/2022  EF25-30%, D shaped LV, RV mildly dilated w/ mod elevated RVSP, RV systolic fx mildly reduced, severe BAE, severe TR, mild-mod MR, IVC dilated, assumed RAP 15  - s/p STJ ICD - NYHA III. Volume status looks ok. Continue torsemide 40 mg twice a day.  - No spiro or entresto due to AKI. Creatinine>3 at last check.  - Continue farxiga 10 mg daily - Add Bidil 1 tab three times a day .  - Check BMET BNP today - Can leave unna boots off. Recommend compression stockings. Provided with script.   2. PAF - Of note had TEE 06/2022- no thrombus noted.   - Back in A flutter 07/2022. Chemically converted with IV amio.  - Cut back amio to 200 mg daily  - Continue eliquis 5 mg twice a day.  - Check CBC  - set up sleep study next visit.     3.H/O AKI  -Recent hospitalization creatinine on admit was up to 3.15 from previous Creatinine baseline ~ 1.2 . Denies NSAIDs use.  -07/2022  Renal u/s- no hydronephrosis - Check BMET     4.  DMII -On SGLT2i - On statin - Regimen per PCO  5. Obesity There is no height or weight on file to calculate BMI.  6. Hypertension -Elevated but did not take meds today. - Add Bidil 1 tablet three times a day.   7. Tobacco Abuse Discussed cessation  Discussed HF Paramedicine referral however she declined. I am a little concerned about her medications and have asked her to bring all medications to her follow up.    Maricela Bo Arshdeep Bolger NP-C  8:17 AM

## 2022-09-05 NOTE — Telephone Encounter (Signed)
Pt c/o of Chest Pain: STAT if CP now or developed within 24 hours  1. Are you having CP right now?   Yes - steady hurt  2. Are you experiencing any other symptoms (ex. SOB, nausea, vomiting, sweating)? No  3. How long have you been experiencing CP?   About 3 days  4. Is your CP continuous or coming and going?   Continuous  5. Have you taken Nitroglycerin?  No  Patient stated she is having pains around her defibrillator running to her neck. ?

## 2022-09-05 NOTE — Telephone Encounter (Signed)
Attempted to return call. Phone went straight to VM. LMTCB.

## 2022-09-06 ENCOUNTER — Encounter: Payer: Medicare Other | Admitting: *Deleted

## 2022-09-06 ENCOUNTER — Encounter (HOSPITAL_COMMUNITY): Payer: Self-pay

## 2022-09-06 ENCOUNTER — Ambulatory Visit (HOSPITAL_COMMUNITY)
Admission: RE | Admit: 2022-09-06 | Discharge: 2022-09-06 | Disposition: A | Discharge status: Home / Self Care | Payer: Medicare Other | Source: Ambulatory Visit | Attending: Family Medicine | Admitting: Family Medicine

## 2022-09-06 VITALS — BP 124/76 | HR 82 | Wt 180.2 lb

## 2022-09-06 DIAGNOSIS — Z006 Encounter for examination for normal comparison and control in clinical research program: Secondary | ICD-10-CM

## 2022-09-06 DIAGNOSIS — Z7901 Long term (current) use of anticoagulants: Secondary | ICD-10-CM | POA: Insufficient documentation

## 2022-09-06 DIAGNOSIS — I5022 Chronic systolic (congestive) heart failure: Secondary | ICD-10-CM | POA: Diagnosis not present

## 2022-09-06 DIAGNOSIS — I428 Other cardiomyopathies: Secondary | ICD-10-CM | POA: Diagnosis not present

## 2022-09-06 DIAGNOSIS — Z794 Long term (current) use of insulin: Secondary | ICD-10-CM

## 2022-09-06 DIAGNOSIS — J9610 Chronic respiratory failure, unspecified whether with hypoxia or hypercapnia: Secondary | ICD-10-CM | POA: Diagnosis not present

## 2022-09-06 DIAGNOSIS — N179 Acute kidney failure, unspecified: Secondary | ICD-10-CM | POA: Diagnosis not present

## 2022-09-06 DIAGNOSIS — E119 Type 2 diabetes mellitus without complications: Secondary | ICD-10-CM

## 2022-09-06 DIAGNOSIS — I4892 Unspecified atrial flutter: Secondary | ICD-10-CM | POA: Insufficient documentation

## 2022-09-06 DIAGNOSIS — Z79899 Other long term (current) drug therapy: Secondary | ICD-10-CM | POA: Insufficient documentation

## 2022-09-06 DIAGNOSIS — I110 Hypertensive heart disease with heart failure: Secondary | ICD-10-CM | POA: Diagnosis present

## 2022-09-06 DIAGNOSIS — E669 Obesity, unspecified: Secondary | ICD-10-CM | POA: Diagnosis not present

## 2022-09-06 DIAGNOSIS — Z6830 Body mass index (BMI) 30.0-30.9, adult: Secondary | ICD-10-CM | POA: Insufficient documentation

## 2022-09-06 DIAGNOSIS — I10 Essential (primary) hypertension: Secondary | ICD-10-CM | POA: Diagnosis not present

## 2022-09-06 DIAGNOSIS — Z720 Tobacco use: Secondary | ICD-10-CM | POA: Diagnosis not present

## 2022-09-06 DIAGNOSIS — I480 Paroxysmal atrial fibrillation: Secondary | ICD-10-CM | POA: Diagnosis not present

## 2022-09-06 LAB — COMPREHENSIVE METABOLIC PANEL
ALT: 15 U/L (ref 0–44)
AST: 18 U/L (ref 15–41)
Albumin: 3.2 g/dL — ABNORMAL LOW (ref 3.5–5.0)
Alkaline Phosphatase: 117 U/L (ref 38–126)
Anion gap: 14 (ref 5–15)
BUN: 37 mg/dL — ABNORMAL HIGH (ref 8–23)
CO2: 27 mmol/L (ref 22–32)
Calcium: 9.5 mg/dL (ref 8.9–10.3)
Chloride: 94 mmol/L — ABNORMAL LOW (ref 98–111)
Creatinine, Ser: 2.47 mg/dL — ABNORMAL HIGH (ref 0.44–1.00)
GFR, Estimated: 21 mL/min — ABNORMAL LOW (ref >60)
Glucose, Bld: 308 mg/dL — ABNORMAL HIGH (ref 70–99)
Potassium: 4.2 mmol/L (ref 3.5–5.1)
Sodium: 135 mmol/L (ref 135–145)
Total Bilirubin: 0.9 mg/dL (ref 0.3–1.2)
Total Protein: 7.8 g/dL (ref 6.5–8.1)

## 2022-09-06 LAB — TSH: TSH: 4.275 u[IU]/mL (ref 0.350–4.500)

## 2022-09-06 LAB — BRAIN NATRIURETIC PEPTIDE: B Natriuretic Peptide: 48.8 pg/mL (ref 0.0–100.0)

## 2022-09-06 NOTE — Research (Signed)
ANALOG Informed Consent   Subject Name: [name]  Subject met inclusion and exclusion criteria.  The informed consent form, study requirements and expectations were reviewed with the subject and questions and concerns were addressed prior to the signing of the consent form.  The subject verbalized understanding of the trial requirements.  The subject agreed to participate in the ANALOG trial and signed the informed consent at 11:24 am on 09-06-2022.  The informed consent was obtained prior to performance of any protocol-specific procedures for the subject.  A copy of the signed informed consent was given to the subject and a copy was placed in the subject's medical record.   [place] Victorya Hillman, Research Coordinator 09-06-2022  11:25 am

## 2022-09-06 NOTE — Telephone Encounter (Signed)
Attempted to contact Pt.  Call went to VM.  Left message requesting call back.

## 2022-09-06 NOTE — Patient Instructions (Signed)
Great to see you today! Labwork completed and we will only call with abnormal values.  Please call in March for follow-up appt in April with Dr. Haroldine Laws  At the [facility], you and your health needs are our priority. We have a designated team specialized in the treatment of Heart Failure. This Care Team includes your primary Heart Failure Specialized Cardiologist (physician), Advanced Practice Providers (APPs- Physician Assistants and Nurse Practitioners), and Pharmacist who all work together to provide you with the care you need, when you need it.   You may see any of the following providers on your designated Care Team at your next follow up:  Dr. Glori Bickers Dr. Loralie Champagne Dr. [name] Hires, NP Lyda Jester, [place] [facility] [place], [place] Forestine Na, NP Audry Riles, PharmD   Please be sure to bring in all your medications bottles to every appointment.   Need to Contact us:  If you have any questions or concerns before your next appointment please send [place] a message through [place] or call our office at [phone].    TO LEAVE A MESSAGE FOR THE NURSE SELECT OPTION 2, PLEASE LEAVE A MESSAGE INCLUDING: YOUR NAME DATE OF BIRTH CALL BACK NUMBER REASON FOR CALL**this is important as we prioritize the call backs  YOU WILL RECEIVE A CALL BACK THE SAME DAY AS LONG AS YOU CALL BEFORE 4:00 PM

## 2022-09-06 NOTE — Telephone Encounter (Signed)
Have patient come to device clinic to inspect incision.

## 2022-09-07 ENCOUNTER — Encounter: Payer: Self-pay | Admitting: Internal Medicine

## 2022-09-07 ENCOUNTER — Ambulatory Visit (INDEPENDENT_AMBULATORY_CARE_PROVIDER_SITE_OTHER): Payer: Medicare Other | Admitting: Internal Medicine

## 2022-09-07 ENCOUNTER — Other Ambulatory Visit: Payer: Self-pay

## 2022-09-07 VITALS — BP 145/78 | HR 80 | Temp 97.4°F | Ht 61.0 in | Wt 181.5 lb

## 2022-09-07 DIAGNOSIS — I10 Essential (primary) hypertension: Secondary | ICD-10-CM

## 2022-09-07 DIAGNOSIS — E1122 Type 2 diabetes mellitus with diabetic chronic kidney disease: Secondary | ICD-10-CM

## 2022-09-07 DIAGNOSIS — N184 Chronic kidney disease, stage 4 (severe): Secondary | ICD-10-CM | POA: Diagnosis not present

## 2022-09-07 DIAGNOSIS — Z794 Long term (current) use of insulin: Secondary | ICD-10-CM

## 2022-09-07 DIAGNOSIS — Z7984 Long term (current) use of oral hypoglycemic drugs: Secondary | ICD-10-CM

## 2022-09-07 DIAGNOSIS — I129 Hypertensive chronic kidney disease with stage 1 through stage 4 chronic kidney disease, or unspecified chronic kidney disease: Secondary | ICD-10-CM | POA: Diagnosis not present

## 2022-09-07 DIAGNOSIS — F17210 Nicotine dependence, cigarettes, uncomplicated: Secondary | ICD-10-CM

## 2022-09-07 DIAGNOSIS — E1165 Type 2 diabetes mellitus with hyperglycemia: Secondary | ICD-10-CM

## 2022-09-07 LAB — POCT GLYCOSYLATED HEMOGLOBIN (HGB A1C): Hemoglobin A1C: 10.5 % — AB (ref 4.0–5.6)

## 2022-09-07 LAB — GLUCOSE, CAPILLARY: Glucose-Capillary: 388 mg/dL — ABNORMAL HIGH (ref 70–99)

## 2022-09-07 MED ORDER — OZEMPIC (0.25 OR 0.5 MG/DOSE) 2 MG/1.5ML ~~LOC~~ SOPN: 0.5000 mg | PEN_INJECTOR | SUBCUTANEOUS | 1 refills | Status: DC

## 2022-09-07 NOTE — Assessment & Plan Note (Signed)
The patient presents to the [facility] for 1 month follow-up of her uncontrolled type 2 diabetes.  Her last A1c was 9.7% 3 months ago and is 10.5% today.  She has been taking Farxiga 10 mg daily and Ozempic 0.25 mg weekly.  The patient states that she checks her sugars at home and they have ranged from 119 to low 200s.  The patient was previously on degludec and Humalog, but insulin was discontinued due to falls in the setting of hypoglycemia.  We discussed today that with her elevated A1c, that she may need to restart on a low-dose of insulin in the near future.  We will defer initiating insulin at this time, as the patient will be traveling to [place] soon and will not be able to follow up closely with [place].  Plan: -Continue Farxiga 10 mg daily (no indication to increase dose for hyperglycemia due to GFR) -Increase Ozempic 2.5 mg/week -Consider initiating low-dose long-acting insulin at next office visit -Follow-up once back from [place] in January -Make appointment to see ophthalmologist

## 2022-09-07 NOTE — Progress Notes (Signed)
CC: diabetes  HPI:  [name] is a 71 y.o. female living with a history stated below and presents today for a follow up of her diabetes and AWV. Please see problem based assessment and plan for additional details.  Past Medical History:  Diagnosis Date   ATN (acute tubular necrosis) ([place]) 07/15/2014   Automatic implantable cardioverter-defibrillator in situ    Automatic implantable cardioverter-defibrillator in situ 05/04/2007   Qualifier: Diagnosis of  By: Isla Pence     Cardiac defibrillator in situ    Atlas II VR ([facility]) implanted by Dr Lovena Le   CHF (congestive heart failure) ([place])    Chronic combined systolic and diastolic heart failure ([place])    a. EF 35-40% in past;  b. Echo 7/13:  EF 45-50%, Gr 2 diast dysfn, mild AI, mild MAC, trivial MR, mild LAE, PASP 47.   Chronic ulcer of leg ([place])    04-09-15 resolved-not a problem.   Colon polyps 04/12/2013   Rectosigmoid polyp   COPD (chronic obstructive pulmonary disease) (HCC)    O2 at night   Depression    Diabetes mellitus    Diabetes mellitus, type 2 ([place]) 04/03/2012   HIGH RISK FEET.. Please have patient take shoes and socks off every visit for visual foot inspection.     Eczema    Elevated alkaline phosphatase level    GGT and 5'nucleotidase 8/13 normal   Health care maintenance 01/22/2013   Surgically absent cervix- no pap needed (Path report 07/2000: uterine body with attached bilateral  adnexa and separate cervix.)   History of oxygen administration    oxygen @ 2 l/m nasally bedtime 24/7   HTN (hypertension)    Hx of cardiac cath    a. [place] 2003 normal;  b. [facility] 6/13:  Mild calcification in the LM, o/w normal coronary arteries, EF 45%.    Hyperkalemia 08/08/2017   Hyperlipidemia    Elevated triglyceride in 2019   Hyperlipidemia associated with type 2 diabetes mellitus ([place]) 05/25/2007   Qualifier: Diagnosis of  By: Hassell Done FNP, [place]     Hyperthyroidism, subclinical    HYPERTHYROIDISM, SUBCLINICAL 05/06/2009    Qualifier: Diagnosis of  By: Darrick Meigs     Implantable cardioverter-defibrillator (ICD) generator end of life    Lipoma    Need for hepatitis C screening test 04/25/2019   NICM (nonischemic cardiomyopathy) ([place])    Obesity    On home oxygen therapy    "2L; 24/7" (10/11/2016)   Post menopausal syndrome    Shortness of breath 07/14/2017   Skin rash 07/12/2018   Sleep apnea    pt denies 04/12/2013    Current Outpatient Medications on File Prior to Visit  Medication Sig Dispense Refill   albuterol (VENTOLIN HFA) 108 (90 Base) MCG/ACT inhaler Inhale 2 puffs into the lungs every 6 (six) hours as needed for wheezing or shortness of breath. (Patient taking differently: Inhale 2 puffs into the lungs daily as needed for wheezing or shortness of breath.) 8 g 2   amiodarone (PACERONE) 200 MG tablet Take 1 tablet (200 mg total) by mouth daily. 90 tablet 1   ANORO ELLIPTA 62.5-25 MCG/ACT AEPB INHALE 1 PUFF INTO THE LUNGS DAILY (Patient taking differently: Inhale 1 puff into the lungs in the morning.) 60 each 2   aspirin EC 81 MG tablet Take 81 mg by mouth daily. Swallow whole.     atorvastatin (LIPITOR) 80 MG tablet Take 80 mg by mouth at bedtime.     calcium-vitamin  D (OSCAL WITH D) 500-200 MG-UNIT tablet Take 1 tablet by mouth 2 (two) times daily. (Patient taking differently: Take 1 tablet by mouth at bedtime.) 90 tablet 6   dapagliflozin propanediol (FARXIGA) 10 MG TABS tablet Take 1 tablet (10 mg total) by mouth daily. 90 tablet 1   dupilumab (DUPIXENT) 200 MG/1.14ML prefilled syringe Inject 200 mg into the skin every 14 (fourteen) days. Every other Friday     Dupilumab (DUPIXENT) 300 MG/2ML SOPN Inject 300 mg into the skin every 14 (fourteen) days.     Insulin Pen Needle (PEN NEEDLES) 32G X 4 MM MISC 1 Needle by Does not apply route once a week. 100 each 1   isosorbide-hydrALAZINE (BIDIL) 20-37.5 MG tablet Take 1 tablet by mouth 3 (three) times daily. 90 tablet 3   OVER THE COUNTER MEDICATION  Apply 1 Application topically in the morning and at bedtime. OTC Cream     potassium chloride SA (KLOR-CON M) 20 MEQ tablet Take 1 tablet (20 mEq total) by mouth daily. 90 tablet 3   torsemide (DEMADEX) 20 MG tablet Take 4 tablets (80 mg total) by mouth every morning and 2 tablets (68m) in the evening. 180 tablet 0   No current facility-administered medications on file prior to visit.    Family History  Problem Relation Age of Onset   Stroke Mother    Seizures Father    Heart disease Father    Diabetes Sister    Asthma Maternal Aunt        aunts   Asthma Maternal Uncle        uncles   Heart disease Paternal Aunt        aunts   Heart disease Paternal Uncle        uncles   Heart disease Maternal Aunt        aunts   Heart disease Maternal Uncle        uncles   Heart disease Maternal Grandfather    Breast cancer Daughter    Breast cancer Cousin    Asthma Grandchild    Colon cancer Neg Hx    Colon polyps Neg Hx    Esophageal cancer Neg Hx    Kidney disease Neg Hx    Gallbladder disease Neg Hx     Social History   Socioeconomic History   Marital status: Significant Other    Spouse name: Not on file   Number of children: 3   Years of education: 11   Highest education level: 11th grade  Occupational History   Occupation: disabled  Tobacco Use   Smoking status: Every Day    Packs/day: 0.50    Years: 45.00    Total pack years: 22.50    Types: Cigarettes   Smokeless tobacco: Never   Tobacco comments:    currently smoking 2 cigs per day  Vaping Use   Vaping Use: Never used  Substance and Sexual Activity   Alcohol use: Not Currently    Alcohol/week: 3.0 standard drinks of alcohol    Types: 3 Shots of liquor per week   Drug use: No   Sexual activity: Yes    Partners: Male    Birth control/protection: Surgical  Other Topics Concern   Not on file  Social History Narrative   ** Merged History Encounter **       Married   Patient enjoys going to [employer]  and spending time with family   Social Determinants of HRadio broadcast assistant  Strain: Low Risk  (06/16/2021)   Overall Financial Resource Strain (CARDIA)    Difficulty of Paying Living Expenses: Not very hard  Food Insecurity: No Food Insecurity (06/27/2022)   Hunger Vital Sign    Worried About Running Out of Food in the Last Year: Never true    Ran Out of Food in the Last Year: Never true  Transportation Needs: No Transportation Needs (06/27/2022)   PRAPARE - Hydrologist (Medical): No    Lack of Transportation (Non-Medical): No  Physical Activity: Not on file  Stress: Not on file  Social Connections: Not on file  Intimate Partner Violence: Not At Risk (06/27/2022)   Humiliation, Afraid, Rape, and Kick questionnaire    Fear of Current or Ex-Partner: No    Emotionally Abused: No    Physically Abused: No    Sexually Abused: No    Review of Systems: ROS negative except for what is noted on the assessment and plan.  Vitals:   09/07/22 0919  BP: (!) 145/78  Pulse: 80  Temp: (!) 97.4 F (36.3 C)  TempSrc: Oral  SpO2: 100%  Weight: 181 lb 8 oz (82.3 kg)  Height: _0  (1.549 m)    Physical Exam: Constitutional: well-appearing elderly female sitting in chair, in no acute distress Cardiovascular: regular rate and rhythm, no m/r/g Pulmonary/Chest: normal work of breathing on room air, lungs clear to auscultation bilaterally Abdominal: soft, non-tender, non-distended, normal BS MSK: normal bulk and tone Neurological: alert & oriented x 3, no focal deficit Skin: warm and dry Psych: normal mood and behavior  Assessment & Plan:     Patient discussed with Dr. Evette Doffing  Uncontrolled type 2 diabetes mellitus with hyperglycemia [facility]) The patient presents to the [facility] for 1 month follow-up of her uncontrolled type 2 diabetes.  Her last A1c was 9.7% 3 months ago and is 10.5% today.  She has been taking Farxiga 10 mg daily and Ozempic 0.25 mg weekly.   The patient states that she checks her sugars at home and they have ranged from 119 to low 200s.  The patient was previously on degludec and Humalog, but insulin was discontinued due to falls in the setting of hypoglycemia.  We discussed today that with her elevated A1c, that she may need to restart on a low-dose of insulin in the near future.  We will defer initiating insulin at this time, as the patient will be traveling to [place] soon and will not be able to follow up closely with [place].  Plan: -Continue Farxiga 10 mg daily (no indication to increase dose for hyperglycemia due to GFR) -Increase Ozempic 2.5 mg/week -Consider initiating low-dose long-acting insulin at next office visit -Follow-up once back from [place] in January -Make appointment to see ophthalmologist  Essential hypertension Blood pressure 145/78 initially, and systolic improved to 643P upon recheck.  She is on Farxiga 10 mg daily, torsemide 40 mg twice daily, and BiDil 1 tablet 3 times daily.  She is doing well on her current regimen and denies any headaches, chest pain, or dyspnea.  Plan: -Continue regimen as above  CKD (chronic kidney disease) stage 4, GFR 15-29 ml/min (HCC) The patient has a history of CKD stage IV, but has not followed up with nephrology.  Last GFR was 21 yesterday, still consistent with CKD stage IV.   Plan: -Referral to nephrology   Cresencio Reesor, D.O. [facility], PGY-2 Phone: [phone] Date 09/07/2022 Time 9:49 AM

## 2022-09-07 NOTE — Assessment & Plan Note (Signed)
The patient has a history of CKD stage IV, but has not followed up with nephrology.  Last GFR was 21 yesterday, still consistent with CKD stage IV.   Plan: -Referral to nephrology

## 2022-09-07 NOTE — Patient Instructions (Signed)
Thank you, [name] for allowing [place] to provide your care today. Today we discussed:  Diabetes: Continue to take farxiga 10 mg daily and INCREASE your ozempic to 0.5 mg per week now. When you get back from [place], we may need to consider re-starting you on a low dose of insulin to help your diabetes get better controlled  Chronic kidney disease: I have referred you to a nephrologist (kidney doctor)  I have ordered the following labs for you:   Lab Orders         Glucose, capillary         POC Hbg A1C       Referrals ordered today:   Referral Orders  No referral(s) requested today     I have ordered the following medication/changed the following medications:   Stop the following medications: Medications Discontinued During This Encounter  Medication Reason   Semaglutide,0.25 or 0.'5MG'$ /DOS, (OZEMPIC, 0.25 OR 0.5 MG/DOSE,) 2 MG/1.5ML SOPN Reorder     Start the following medications: Meds ordered this encounter  Medications   Semaglutide,0.25 or 0.'5MG'$ /DOS, (OZEMPIC, 0.25 OR 0.5 MG/DOSE,) 2 MG/1.5ML SOPN    Sig: Inject 0.5 mg into the skin once a week.    Dispense:  3 mL    Refill:  1    Please teach patient how to use ozempic at her current dose.     Follow up:  1 month or as soon as back from [place]     Should you have any questions or concerns please call the internal medicine clinic at [phone].     Buddy Duty, D.O. [place]

## 2022-09-07 NOTE — Telephone Encounter (Signed)
Attempted to call patient to make DC apt. No answer, LMTCB.   Mychart is pending so unable to send message.

## 2022-09-07 NOTE — Assessment & Plan Note (Signed)
Blood pressure 145/78 initially, and systolic improved to 322G upon recheck.  She is on Farxiga 10 mg daily, torsemide 40 mg twice daily, and BiDil 1 tablet 3 times daily.  She is doing well on her current regimen and denies any headaches, chest pain, or dyspnea.  Plan: -Continue regimen as above

## 2022-09-08 ENCOUNTER — Encounter (HOSPITAL_COMMUNITY): Payer: Self-pay

## 2022-09-08 NOTE — Progress Notes (Signed)
Internal Medicine Clinic Attending ° °Case discussed with Dr. Atway  At the time of the visit.  We reviewed the resident’s history and exam and pertinent patient test results.  I agree with the assessment, diagnosis, and plan of care documented in the resident’s note.  °

## 2022-09-09 LAB — HM DIABETES EYE EXAM

## 2022-09-12 ENCOUNTER — Telehealth (HOSPITAL_COMMUNITY): Payer: Self-pay | Admitting: *Deleted

## 2022-09-12 ENCOUNTER — Encounter: Payer: Self-pay | Admitting: Internal Medicine

## 2022-09-12 ENCOUNTER — Ambulatory Visit: Payer: Medicare Other | Attending: Internal Medicine | Admitting: Internal Medicine

## 2022-09-12 VITALS — BP 158/82 | HR 87 | Ht 61.0 in | Wt 176.0 lb

## 2022-09-12 DIAGNOSIS — Z95810 Presence of automatic (implantable) cardiac defibrillator: Secondary | ICD-10-CM | POA: Diagnosis not present

## 2022-09-12 DIAGNOSIS — I5022 Chronic systolic (congestive) heart failure: Secondary | ICD-10-CM | POA: Diagnosis not present

## 2022-09-12 NOTE — Patient Instructions (Signed)
Medication Instructions:  Your physician recommends that you continue on your current medications as directed. Please refer to the Current Medication list given to you today.  *If you need a refill on your cardiac medications before your next appointment, please call your pharmacy*  Lab Work: None ordered.  If you have labs (blood work) drawn today and your tests are completely normal, you will receive your results only by: [place] (if you have MyChart) OR A paper copy in the mail If you have any lab test that is abnormal or we need to change your treatment, we will call you to review the results.  Testing/Procedures: None ordered.  Follow-Up: At [facility], you and your health needs are our priority.  As part of our continuing mission to provide you with exceptional heart care, we have created designated Provider Care Teams.  These Care Teams include your primary Cardiologist (physician) and Advanced Practice Providers (APPs -  Physician Assistants and Nurse Practitioners) who all work together to provide you with the care you need, when you need it.  We recommend signing up for the patient portal called "MyChart".  Sign up information is provided on this After Visit Summary.  MyChart is used to connect with patients for Virtual Visits (Telemedicine).  Patients are able to view lab/test results, encounter notes, upcoming appointments, etc.  Non-urgent messages can be sent to your provider as well.   To learn more about what you can do with MyChart, go to [email or web address].    Your next appointment:   1 year(s)  The format for your next appointment:   In Person  Provider:   Cristopher Peru, MD{or one of the following Advanced Practice Providers on your designated Care Team:   Tommye Standard, [place] Legrand Como "Jonni Sanger" Chalmers Cater, [place]  Remote monitoring is used to monitor your ICD from home. This monitoring reduces the number of office visits required to check your device to one  time per year. It allows [place] to keep an eye on the functioning of your device to ensure it is working properly. You are scheduled for a device check from home. You may send your transmission at any time that day. If you have a wireless device, the transmission will be sent automatically. After your physician reviews your transmission, you will receive a postcard with your next transmission date.  Important Information About Sugar

## 2022-09-12 NOTE — Progress Notes (Signed)
HPI [name] returns today for ongoing evaluation and management of her CHF, NICM, s/p ICD insertion. The patient has been stable in the interim. She is still smoking. She has mild peripheral edema. No syncope. No ICD shock. No chest pain. She is down to smoking 3 cigs a day and trying to stop altogether. No ICD therapies.  Allergies  Allergen Reactions   Actos [Pioglitazone] Other (See Comments)    congestive heart failure    Naproxen Other (See Comments)    538m dose made her sleep for two days   Rosiglitazone Hives   Tomato Itching   Hydrocodone-Acetaminophen Itching   Hydrocortisone Hives and Itching     Current Outpatient Medications  Medication Sig Dispense Refill   albuterol (VENTOLIN HFA) 108 (90 Base) MCG/ACT inhaler Inhale 2 puffs into the lungs every 6 (six) hours as needed for wheezing or shortness of breath. 8 g 2   amiodarone (PACERONE) 200 MG tablet Take 1 tablet (200 mg total) by mouth daily. 90 tablet 1   ANORO ELLIPTA 62.5-25 MCG/ACT AEPB INHALE 1 PUFF INTO THE LUNGS DAILY (Patient taking differently: Inhale 1 puff into the lungs in the morning.) 60 each 2   aspirin EC 81 MG tablet Take 81 mg by mouth daily. Swallow whole.     atorvastatin (LIPITOR) 80 MG tablet Take 80 mg by mouth at bedtime.     calcium-vitamin D (OSCAL WITH D) 500-200 MG-UNIT tablet Take 1 tablet by mouth 2 (two) times daily. (Patient taking differently: Take 1 tablet by mouth at bedtime.) 90 tablet 6   dapagliflozin propanediol (FARXIGA) 10 MG TABS tablet Take 1 tablet (10 mg total) by mouth daily. 90 tablet 1   dupilumab (DUPIXENT) 200 MG/1.14ML prefilled syringe Inject 200 mg into the skin every 14 (fourteen) days. Every other Friday     Dupilumab (DUPIXENT) 300 MG/2ML SOPN Inject 300 mg into the skin every 14 (fourteen) days.     Insulin Pen Needle (PEN NEEDLES) 32G X 4 MM MISC 1 Needle by Does not apply route once a week. 100 each 1   isosorbide-hydrALAZINE (BIDIL) 20-37.5 MG tablet  Take 1 tablet by mouth 3 (three) times daily. 90 tablet 3   OVER THE COUNTER MEDICATION Apply 1 Application topically in the morning and at bedtime. OTC Cream     potassium chloride SA (KLOR-CON M) 20 MEQ tablet Take 1 tablet (20 mEq total) by mouth daily. 90 tablet 3   Semaglutide,0.25 or 0.5MG/DOS, (OZEMPIC, 0.25 OR 0.5 MG/DOSE,) 2 MG/1.5ML SOPN Inject 0.5 mg into the skin once a week. 3 mL 1   torsemide (DEMADEX) 20 MG tablet Take 4 tablets (80 mg total) by mouth every morning and 2 tablets (440m in the evening. 180 tablet 0   No current facility-administered medications for this visit.     Past Medical History:  Diagnosis Date   ATN (acute tubular necrosis) ([place]/13/2015   Automatic implantable cardioverter-defibrillator in situ    Automatic implantable cardioverter-defibrillator in situ 05/04/2007   Qualifier: Diagnosis of  By: CrIsla Pence   Cardiac defibrillator in situ    Atlas II VR (SJM) implanted by Dr TaLovena Le CHF (congestive heart failure) ([place]   Chronic combined systolic and diastolic heart failure ([place]   a. EF 35-40% in past;  b. Echo 7/13:  EF 45-50%, Gr 2 diast dysfn, mild AI, mild MAC, trivial MR, mild LAE, PASP 47.   Chronic ulcer of leg (HCM[place]  04-09-15 resolved-not a problem.   Colon polyps 04/12/2013   Rectosigmoid polyp   COPD (chronic obstructive pulmonary disease) (HCC)    O2 at night   Depression    Diabetes mellitus    Diabetes mellitus, type 2 ([place]) 04/03/2012   HIGH RISK FEET.. Please have patient take shoes and socks off every visit for visual foot inspection.     Eczema    Elevated alkaline phosphatase level    GGT and 5'nucleotidase 8/13 normal   Health care maintenance 01/22/2013   Surgically absent cervix- no pap needed (Path report 07/2000: uterine body with attached bilateral  adnexa and separate cervix.)   History of oxygen administration    oxygen @ 2 l/m nasally bedtime 24/7   HTN (hypertension)    Hx of cardiac cath    a. [place]  2003 normal;  b. LHC 6/13:  Mild calcification in the LM, o/w normal coronary arteries, EF 45%.    Hyperkalemia 08/08/2017   Hyperlipidemia    Elevated triglyceride in 2019   Hyperlipidemia associated with type 2 diabetes mellitus ([place]) 05/25/2007   Qualifier: Diagnosis of  By: Hassell Done FNP, [place]     Hyperthyroidism, subclinical    HYPERTHYROIDISM, SUBCLINICAL 05/06/2009   Qualifier: Diagnosis of  By: Darrick Meigs     Implantable cardioverter-defibrillator (ICD) generator end of life    Lipoma    Need for hepatitis C screening test 04/25/2019   NICM (nonischemic cardiomyopathy) ([place])    Obesity    On home oxygen therapy    "2L; 24/7" (10/11/2016)   Post menopausal syndrome    Shortness of breath 07/14/2017   Skin rash 07/12/2018   Sleep apnea    pt denies 04/12/2013    ROS:   All systems reviewed and negative except as noted in the HPI.   Past Surgical History:  Procedure Laterality Date   ABDOMINAL HYSTERECTOMY     CARDIAC CATHETERIZATION     CARDIAC CATHETERIZATION N/A 06/21/2016   Procedure: Left Heart Cath and Coronary Angiography;  Surgeon: Sherren Mocha, MD;  Location: [facility];  Service: Cardiovascular;  Laterality: N/A;   CARDIAC DEFIBRILLATOR PLACEMENT  05/04/2007   SJM Atlas II VR ICD   CARDIAC DEFIBRILLATOR PLACEMENT     CATARACT EXTRACTION     od   COLONOSCOPY N/A 04/12/2013   Procedure: COLONOSCOPY;  Surgeon: Beryle Beams, MD;  Location: [facility];  Service: Endoscopy;  Laterality: N/A;  pt.has defibrilator   COLONOSCOPY N/A 04/16/2015   Procedure: COLONOSCOPY;  Surgeon: [name], MD;  Location: [facility];  Service: Endoscopy;  Laterality: N/A;   ESOPHAGOGASTRODUODENOSCOPY N/A 04/16/2015   Procedure: ESOPHAGOGASTRODUODENOSCOPY (EGD);  Surgeon: [name], MD;  Location: [facility];  Service: Endoscopy;  Laterality: N/A;   EYE SURGERY     HERNIA REPAIR     HYSTEROSCOPY     IMPLANTABLE CARDIOVERTER DEFIBRILLATOR (ICD) GENERATOR  CHANGE N/A 04/02/2014   Procedure: ICD GENERATOR CHANGE;  Surgeon: Evans Lance, MD;  Location[facility];  Service: Cardiovascular;  Laterality: N/A;   INSERT / REPLACE / REMOVE PACEMAKER     LEFT HEART CATHETERIZATION WITH CORONARY ANGIOGRAM N/A 04/02/2012   Procedure: LEFT HEART CATHETERIZATION WITH CORONARY ANGIOGRAM;  Surgeon: Hillary Bow, MD;  Location: [facility] CATH LAB;  Service: Cardiovascular;  Laterality: N/A;   ORIF ANKLE FRACTURE Right 08/12/2019   Procedure: OPEN REDUCTION INTERNAL FIXATION (ORIF) RIGHT TRIMALLEOLAR ANKLE FRACTURE;  Surgeon: Leandrew Koyanagi, MD;  Location: [place];  Service:  Orthopedics;  Laterality: Right;   RIGHT HEART CATH N/A 08/03/2022   Procedure: RIGHT HEART CATH;  Surgeon: Jolaine Artist, MD;  Location: [place] [facility];  Service: Cardiovascular;  Laterality: N/A;   TEE WITHOUT CARDIOVERSION N/A 06/23/2022   Procedure: TRANSESOPHAGEAL ECHOCARDIOGRAM (TEE);  Surgeon: [name], MD;  Location: [place];  Service: Cardiology;  Laterality: N/A;   TONSILLECTOMY     TUBAL LIGATION       Family History  Problem Relation Age of Onset   Stroke Mother    Seizures Father    Heart disease Father    Diabetes Sister    Asthma Maternal Aunt        aunts   Asthma Maternal Uncle        uncles   Heart disease Paternal Aunt        aunts   Heart disease Paternal Uncle        uncles   Heart disease Maternal Aunt        aunts   Heart disease Maternal Uncle        uncles   Heart disease Maternal Grandfather    Breast cancer Daughter    Breast cancer Cousin    Asthma Grandchild    Colon cancer Neg Hx    Colon polyps Neg Hx    Esophageal cancer Neg Hx    Kidney disease Neg Hx    Gallbladder disease Neg Hx      Social History   Socioeconomic History   Marital status: Significant Other    Spouse name: Not on file   Number of children: 3   Years of education: 11   Highest education level: 11th grade  Occupational History   Occupation:  disabled  Tobacco Use   Smoking status: Every Day    Packs/day: 0.50    Years: 45.00    Total pack years: 22.50    Types: Cigarettes   Smokeless tobacco: Never   Tobacco comments:    currently smoking 2 cigs per day  Vaping Use   Vaping Use: Never used  Substance and Sexual Activity   Alcohol use: Not Currently    Alcohol/week: 3.0 standard drinks of alcohol    Types: 3 Shots of liquor per week   Drug use: No   Sexual activity: Yes    Partners: Male    Birth control/protection: Surgical  Other Topics Concern   Not on file  Social History Narrative   ** Merged History Encounter **       Married   Patient enjoys going to [employer], church and spending time with family   Social Determinants of Health   Financial Resource Strain: Low Risk  (06/16/2021)   Overall Financial Resource Strain (CARDIA)    Difficulty of Paying Living Expenses: Not very hard  Food Insecurity: No Food Insecurity (06/27/2022)   Hunger Vital Sign    Worried About Running Out of Food in the Last Year: Never true    Ran Out of Food in the Last Year: Never true  Transportation Needs: No Transportation Needs (06/27/2022)   PRAPARE - Hydrologist (Medical): No    Lack of Transportation (Non-Medical): No  Physical Activity: Not on file  Stress: Not on file  Social Connections: Not on file  Intimate Partner Violence: Not At Risk (06/27/2022)   Humiliation, Afraid, Rape, and Kick questionnaire    Fear of Current or Ex-Partner: No    Emotionally Abused: No  Physically Abused: No    Sexually Abused: No     BP (!) 158/82   Pulse 87   Ht _0  (1.549 m)   Wt 176 lb (79.8 kg)   SpO2 95%   BMI 33.25 kg/m   Physical Exam:  Well appearing NAD HEENT: Unremarkable Neck:  No JVD, no thyromegally Lymphatics:  No adenopathy Back:  No CVA tenderness Lungs:  Clear HEART:  Regular rate rhythm, no murmurs, no rubs, no clicks Abd:  soft, positive bowel sounds, no organomegally,  no rebound, no guarding Ext:  2 plus pulses, no edema, no cyanosis, no clubbing Skin:  No rashes no nodules Neuro:  CN II through XII intact, motor grossly intact  EKG - nsr  DEVICE  Normal device function.  See PaceArt for details.   Assess/Plan:   Chronic systolic heart failure - her symptoms are class 2. She will continue her current meds. 2. Tobacco abuse - I strongly encouraged the patient to maintain a low sodium diet.  3. COPD - I encouraged her to stop her smoking. 4. ICD - her [place] ICD is working normally. We will recheck in several months.   Mikle Bosworth.D.

## 2022-09-12 NOTE — Telephone Encounter (Signed)
Pt left vm requesting a return call. I called pt back no answer/left vm for return call.

## 2022-09-13 NOTE — Telephone Encounter (Signed)
Patient was seen in office by Dr. Lovena Le 09/12/22.

## 2022-09-14 ENCOUNTER — Encounter: Payer: Self-pay | Admitting: *Deleted

## 2022-09-14 DIAGNOSIS — Z006 Encounter for examination for normal comparison and control in clinical research program: Secondary | ICD-10-CM

## 2022-09-14 NOTE — Research (Signed)
Patient enrolled in Analog Study on 09-06-2022. Daughter is a Marine scientist and told her it was to complicated for her to use the device and told the patient to bring it back to our office. I have withdrawn the patient from the study.     [name], Research Coordinator 09-15-2023 14:42 p.m.

## 2022-09-21 ENCOUNTER — Telehealth: Payer: Self-pay

## 2022-09-21 NOTE — Telephone Encounter (Signed)
Opened in error;  Pt saw Dr. Lovena Le in clinic 09/12/2022.

## 2022-09-21 NOTE — Telephone Encounter (Signed)
Prior Authorization for patient [name] device) came through on cover my meds was submitted with last office notes and labs awaiting approval or denial

## 2022-10-01 ENCOUNTER — Encounter (HOSPITAL_COMMUNITY): Payer: Self-pay | Admitting: Emergency Medicine

## 2022-10-01 ENCOUNTER — Emergency Department (HOSPITAL_COMMUNITY)
Admission: EM | Admit: 2022-10-01 | Discharge: 2022-10-01 | Disposition: A | Discharge status: Home / Self Care | Payer: Medicare Other | Attending: Emergency Medicine | Admitting: Emergency Medicine

## 2022-10-01 DIAGNOSIS — Z4800 Encounter for change or removal of nonsurgical wound dressing: Secondary | ICD-10-CM | POA: Diagnosis present

## 2022-10-01 DIAGNOSIS — E119 Type 2 diabetes mellitus without complications: Secondary | ICD-10-CM | POA: Diagnosis not present

## 2022-10-01 DIAGNOSIS — I10 Essential (primary) hypertension: Secondary | ICD-10-CM | POA: Insufficient documentation

## 2022-10-01 DIAGNOSIS — Z794 Long term (current) use of insulin: Secondary | ICD-10-CM | POA: Diagnosis not present

## 2022-10-01 DIAGNOSIS — Z5189 Encounter for other specified aftercare: Secondary | ICD-10-CM

## 2022-10-01 DIAGNOSIS — Z7982 Long term (current) use of aspirin: Secondary | ICD-10-CM | POA: Diagnosis not present

## 2022-10-01 NOTE — ED Provider Notes (Signed)
[facility] EMERGENCY DEPARTMENT Provider Note   CSN: [id number] Arrival date & time: 10/01/22  1109     History  Chief Complaint  Patient presents with   Knot on right knee    [name] is a 71 y.o. female.  HPI   Patient with medical history of hypertension, hyperlipidemia, type 2 diabetes presents to the emergency department due to wound check.  Patient had a wound/abscess to her right knee, was seen in [place] 09/27/2022 and started on Bactrim twice daily and Levaquin once daily.  She is been taking her medications.  She has had some draining from the wound site, should have an I&D at the urgent care/ED when she was seen in [place].  She denies any fevers or chills, no pain with any movement.  She is changing the dressing daily and has not noticed any spreading erythema or warmth.  Home Medications Prior to Admission medications   Medication Sig Start Date End Date Taking? Authorizing Provider  albuterol (VENTOLIN HFA) 108 (90 Base) MCG/ACT inhaler Inhale 2 puffs into the lungs every 6 (six) hours as needed for wheezing or shortness of breath. 04/28/21   Maudie Mercury, MD  amiodarone (PACERONE) 200 MG tablet Take 1 tablet (200 mg total) by mouth daily. 08/18/22 02/14/23  Virl Axe, MD  ANORO ELLIPTA 62.5-25 MCG/ACT AEPB INHALE 1 PUFF INTO THE LUNGS DAILY Patient taking differently: Inhale 1 puff into the lungs in the morning. 07/20/22   Mapp, Claudia Desanctis, MD  aspirin EC 81 MG tablet Take 81 mg by mouth daily. Swallow whole.    [provider]  atorvastatin (LIPITOR) 80 MG tablet Take 80 mg by mouth at bedtime.    [provider]  calcium-vitamin D (OSCAL WITH D) 500-200 MG-UNIT tablet Take 1 tablet by mouth 2 (two) times daily. Patient taking differently: Take 1 tablet by mouth at bedtime. 02/12/20   Kathi Ludwig, MD  dapagliflozin propanediol (FARXIGA) 10 MG TABS tablet Take 1 tablet (10 mg total) by mouth daily. 08/18/22   Virl Axe, MD  dupilumab (DUPIXENT) 200 MG/1.14ML prefilled syringe Inject 200 mg into the skin every 14 (fourteen) days. Every other Friday    [provider]  Dupilumab (DUPIXENT) 300 MG/2ML SOPN Inject 300 mg into the skin every 14 (fourteen) days.    [provider]  Insulin Pen Needle (PEN NEEDLES) 32G X 4 MM MISC 1 Needle by Does not apply route once a week. 08/18/22   Virl Axe, MD  isosorbide-hydrALAZINE (BIDIL) 20-37.5 MG tablet Take 1 tablet by mouth 3 (three) times daily. 08/11/22   Clegg, Amy D, NP  OVER THE COUNTER MEDICATION Apply 1 Application topically in the morning and at bedtime. OTC Cream    [provider]  potassium chloride SA (KLOR-CON M) 20 MEQ tablet Take 1 tablet (20 mEq total) by mouth daily. 07/15/22   [place], Maricela Bo, FNP  Semaglutide,0.25 or 0.'5MG'$ /DOS, (OZEMPIC, 0.25 OR 0.5 MG/DOSE,) 2 MG/1.5ML SOPN Inject 0.5 mg into the skin once a week. 09/07/22   Atway, Rayann N, DO  torsemide (DEMADEX) 20 MG tablet Take 4 tablets (80 mg total) by mouth every morning and 2 tablets ('40mg'$ ) in the evening. 08/05/22 09/06/22  Arrien, Jimmy Picket, MD      Allergies    Actos [pioglitazone], Naproxen, Rosiglitazone, Tomato, Hydrocodone-acetaminophen, and Hydrocortisone    Review of Systems   Review of Systems  Physical Exam Updated Vital Signs BP (!) 149/69 (BP Location: Left Arm)  Pulse 71   Temp (!) 97.5 F (36.4 C) (Oral)   Resp 18   Ht '5\' 1"'$  (1.549 m)   Wt 78.5 kg   SpO2 100%   BMI 32.69 kg/m  Physical Exam Vitals and nursing note reviewed. Exam conducted with a chaperone present.  Constitutional:      Appearance: Normal appearance.  HENT:     Head: Normocephalic and atraumatic.  Eyes:     General: No scleral icterus.       Right eye: No discharge.        Left eye: No discharge.     Extraocular Movements: Extraocular movements intact.     Pupils: Pupils are equal, round, and reactive to light.  Cardiovascular:     Rate and  Rhythm: Normal rate and regular rhythm.     Pulses: Normal pulses.     Heart sounds: Normal heart sounds. No murmur heard.    No friction rub. No gallop.  Pulmonary:     Effort: Pulmonary effort is normal. No respiratory distress.     Breath sounds: Normal breath sounds.  Abdominal:     General: Abdomen is flat. Bowel sounds are normal. There is no distension.     Palpations: Abdomen is soft.     Tenderness: There is no abdominal tenderness.  Musculoskeletal:        General: No tenderness.     Comments: Complete passive and active ROM to right knee.  Able to ambulate with a steady gait.  No reproducible tenderness, no crepitus.  Skin:    General: Skin is warm and dry.     Coloration: Skin is not jaundiced.     Findings: Rash present. No erythema.     Comments: See photo of wound right knee  Neurological:     Mental Status: She is alert. Mental status is at baseline.     Coordination: Coordination normal.     ED Results / Procedures / Treatments   Labs (all labs ordered are listed, but only abnormal results are displayed) Labs Reviewed  AEROBIC CULTURE W GRAM STAIN (SUPERFICIAL SPECIMEN)    EKG None  Radiology No results found.  Procedures Procedures    Medications Ordered in ED Medications - No data to display  ED Course/ Medical Decision Making/ A&P                           Medical Decision Making  Patient presented to the emergency department from home for wound check.  Differential includes not limited to abscess, cellulitis, septic joint, necrotizing fasciitis.  On exam patient has wound to right knee.  I do not think this is septic joint, she tolerates passive and active range without any limitations.  Not erythematous, there is no surrounding warmth or crepitus.  She is also afebrile without any SIRS criteria.  I reviewed external medical records including visit from [place].  Family member is also at bedside.  Patient is on Bactrim twice daily and Levaquin  once daily which should provide MRSA coverage.  I did obtain a wound culture.  I considered labs and repeat imaging but I do not think indicated as the wound appears to be healing.  I discussed return precautions with the patient, she will follow-up with her primary doctor this week for reevaluation.        Final Clinical Impression(s) / ED Diagnoses Final diagnoses:  None    Rx / DC Orders ED Discharge Orders  None         Sherrill Raring, [place] 10/01/22 1449    Gareth Morgan, MD 10/02/22 0000

## 2022-10-01 NOTE — Discharge Instructions (Signed)
You are seen today in the emergency department for your knee infection.  We obtained culture, if your current antibiotic regimen is not sufficient we will call and change the antibiotics.  In the meantime you should continue taking the Bactrim twice daily and Levaquin once daily.  Follow-up with your main doctor this week for reevaluation.  Return to the ED for fevers, spreading redness or worsening symptoms.

## 2022-10-01 NOTE — ED Triage Notes (Signed)
Pt reported she notice a knot on her right knee, when she fell. On christmas she went to [place], while she was there she notice drainage coming from the knee. She went to the hospital while she was prescribe antibiotics. Patient just came back to [place] and was told to get the area check, because she is a diabetic.

## 2022-10-03 LAB — AEROBIC CULTURE W GRAM STAIN (SUPERFICIAL SPECIMEN)

## 2022-10-04 ENCOUNTER — Telehealth (HOSPITAL_BASED_OUTPATIENT_CLINIC_OR_DEPARTMENT_OTHER): Payer: Self-pay | Admitting: *Deleted

## 2022-10-04 NOTE — Telephone Encounter (Signed)
Post ED Visit - Positive Culture Follow-up  Culture report reviewed by antimicrobial stewardship pharmacist: [place] Team '[]'$  Elenor Quinones, Pharm.D. '[]'$  Heide Guile, Pharm.D., BCPS AQ-ID '[]'$  Parks Neptune, Pharm.D., BCPS '[]'$  Alycia Rossetti, Pharm.D., BCPS '[]'$  [place], Pharm.D., BCPS, AAHIVP '[]'$  Legrand Como, Pharm.D., BCPS, AAHIVP '[]'$  Salome Arnt, PharmD, BCPS '[]'$  Johnnette Gourd, PharmD, BCPS '[]'$  Hughes Better, PharmD, BCPS '[]'$  Leeroy Cha, PharmD '[]'$  Laqueta Linden, PharmD, BCPS '[]'$  Albertina Parr, PharmD  [place] Team '[]'$  Leodis Sias, PharmD '[]'$  Lindell Spar, PharmD '[]'$  Royetta Asal, PharmD '[]'$  Graylin Shiver, [name] '[]'$  Rema Fendt) Glennon Mac, PharmD '[]'$  Arlyn Dunning, PharmD '[]'$  Netta Cedars, PharmD '[]'$  Dia Sitter, PharmD '[]'$  Leone Haven, PharmD '[]'$  Gretta Arab, PharmD '[]'$  Theodis Shove, PharmD '[]'$  Peggyann Juba, PharmD '[]'$  Reuel Boom, PharmD   Positive wound culture Treated with bactrim and Levaquin outpatient, organism sensitive to the same and no further patient follow-up is required at this time.  Tommie Raymond, PharmD  Harlon Flor Talley 10/04/2022, 11:42 AM

## 2022-10-10 ENCOUNTER — Ambulatory Visit (INDEPENDENT_AMBULATORY_CARE_PROVIDER_SITE_OTHER): Payer: Medicare Other

## 2022-10-10 VITALS — BP 134/76 | HR 86 | Temp 97.6°F | Ht 61.0 in | Wt 173.6 lb

## 2022-10-10 DIAGNOSIS — Z7984 Long term (current) use of oral hypoglycemic drugs: Secondary | ICD-10-CM | POA: Diagnosis not present

## 2022-10-10 DIAGNOSIS — E1143 Type 2 diabetes mellitus with diabetic autonomic (poly)neuropathy: Secondary | ICD-10-CM | POA: Diagnosis not present

## 2022-10-10 DIAGNOSIS — K3184 Gastroparesis: Secondary | ICD-10-CM

## 2022-10-10 DIAGNOSIS — Z7985 Long-term (current) use of injectable non-insulin antidiabetic drugs: Secondary | ICD-10-CM

## 2022-10-10 DIAGNOSIS — R1319 Other dysphagia: Secondary | ICD-10-CM | POA: Diagnosis not present

## 2022-10-10 NOTE — Assessment & Plan Note (Signed)
Patient reports her blood sugars have been well controlled since last OV. Typically run in mid 120s (fasting and mealtime). Denies any hypoglycemic episodes or blood sugars below 117. Denies polyuria, polydipsia. Do not feel that insulin is necessary at this time. -continue Farxiga 10 mg daily -continue Ozempic 0.25 weekly

## 2022-10-10 NOTE — Patient Instructions (Signed)
[name], it was a pleasure seeing you today!  Today we discussed: Food getting stuck in throat (dysphagia)- Gastroenterology will reach out to schedule appointment for you to be evaluated Diabetes- continue with your Farxiga and Ozempic. Return in 2 months for A1c check. Please bring a record of your home blood sugar readings.  I have ordered the following labs today:  Lab Orders  No laboratory test(s) ordered today     Tests ordered today:  none  Referrals ordered today:    Referral Orders         Ambulatory referral to Gastroenterology      I have ordered the following medication/changed the following medications:   Stop the following medications: There are no discontinued medications.   Start the following medications: No orders of the defined types were placed in this encounter.    Follow-up: 2 months   Please make sure to arrive 15 minutes prior to your next appointment. If you arrive late, you may be asked to reschedule.   We look forward to seeing you next time. Please call our clinic at [phone] if you have any questions or concerns. The best time to call is Monday-Friday from 9am-4pm, but there is someone available 24/7. If after hours or the weekend, call the main hospital number and ask for the Internal Medicine Resident On-Call. If you need medication refills, please notify your pharmacy one week in advance and they will send [place] a request.  Thank you for letting us take part in your care. Wishing you the best!  Thank you, Linward Natal, MD

## 2022-10-10 NOTE — Progress Notes (Signed)
CC: discuss DM meds  HPI:  [name] is a 72 y.o. with past medical history as below who presents for f/u of her diabetes. See detailed assessment and plan for HPI.  Past Medical History:  Diagnosis Date   ATN (acute tubular necrosis) ([place]) 07/15/2014   Automatic implantable cardioverter-defibrillator in situ    Automatic implantable cardioverter-defibrillator in situ 05/04/2007   Qualifier: Diagnosis of  By: Isla Pence     Cardiac defibrillator in situ    Atlas II VR (SJM) implanted by Dr Lovena Le   CHF (congestive heart failure) ([place])    Chronic combined systolic and diastolic heart failure ([place])    a. EF 35-40% in past;  b. Echo 7/13:  EF 45-50%, Gr 2 diast dysfn, mild AI, mild MAC, trivial MR, mild LAE, PASP 47.   Chronic ulcer of leg ([place])    04-09-15 resolved-not a problem.   Colon polyps 04/12/2013   Rectosigmoid polyp   COPD (chronic obstructive pulmonary disease) (HCC)    O2 at night   Depression    Diabetes mellitus    Diabetes mellitus, type 2 ([place]) 04/03/2012   HIGH RISK FEET.. Please have patient take shoes and socks off every visit for visual foot inspection.     Eczema    Elevated alkaline phosphatase level    GGT and 5'nucleotidase 8/13 normal   Health care maintenance 01/22/2013   Surgically absent cervix- no pap needed (Path report 07/2000: uterine body with attached bilateral  adnexa and separate cervix.)   History of oxygen administration    oxygen @ 2 l/m nasally bedtime 24/7   HTN (hypertension)    Hx of cardiac cath    a. [place] 2003 normal;  b. LHC 6/13:  Mild calcification in the LM, o/w normal coronary arteries, EF 45%.    Hyperkalemia 08/08/2017   Hyperlipidemia    Elevated triglyceride in 2019   Hyperlipidemia associated with type 2 diabetes mellitus ([place]) 05/25/2007   Qualifier: Diagnosis of  By: Hassell Done FNP, Nykedtra     Hyperthyroidism, subclinical    HYPERTHYROIDISM, SUBCLINICAL 05/06/2009   Qualifier: Diagnosis of  By: Darrick Meigs      Implantable cardioverter-defibrillator (ICD) generator end of life    Lipoma    Need for hepatitis C screening test 04/25/2019   NICM (nonischemic cardiomyopathy) ([place])    Obesity    On home oxygen therapy    "2L; 24/7" (10/11/2016)   Post menopausal syndrome    Shortness of breath 07/14/2017   Skin rash 07/12/2018   Sleep apnea    pt denies 04/12/2013   Review of Systems:  See detailed assessment and plan for pertinent ROS.  Physical Exam:  Vitals:   10/10/22 1037  BP: 134/76  Pulse: 86  Temp: 97.6 F (36.4 C)  TempSrc: Oral  SpO2: 100%  Weight: 173 lb 9.6 oz (78.7 kg)  Height: _0  (1.549 m)   Physical Exam Constitutional:      General: She is not in acute distress.    Comments: In wheelchair.  HENT:     Head: Normocephalic and atraumatic.     Mouth/Throat:     Mouth: Mucous membranes are moist.     Pharynx: Oropharynx is clear. No oropharyngeal exudate or posterior oropharyngeal erythema.  Eyes:     Extraocular Movements: Extraocular movements intact.  Neck:     Thyroid: No thyromegaly or thyroid tenderness.  Cardiovascular:     Rate and Rhythm: Normal rate and regular rhythm.  Pulmonary:  Effort: Pulmonary effort is normal.     Breath sounds: No wheezing or rales.  Musculoskeletal:     Cervical back: Neck supple. No tenderness.  Lymphadenopathy:     Cervical:     Right cervical: No superficial cervical adenopathy.    Left cervical: No superficial cervical adenopathy.  Skin:    General: Skin is warm and dry.  Neurological:     Mental Status: She is alert and oriented to person, place, and time.  Psychiatric:        Mood and Affect: Mood normal.        Behavior: Behavior normal.      Assessment & Plan:   See Encounters Tab for problem based charting.  Diabetic gastroparesis associated with type 2 diabetes mellitus ([place]) Patient reports her blood sugars have been well controlled since last OV. Typically run in mid 120s (fasting and mealtime).  Denies any hypoglycemic episodes or blood sugars below 117. Denies polyuria, polydipsia. Do not feel that insulin is necessary at this time. -continue Farxiga 10 mg daily -continue Ozempic 0.25 weekly  Esophageal dysphagia Patient presents with 2 weeks of feeling of food getting stuck in throat and sore throat with swallowing. Reports gradual onset. Denies history of any such thing. Can only eat soup, applesauce. Couldn't eat mashed potatoes yesterday due to globus sensation and regurgitating. Symptoms noticeable from throat down to sternum. No problems with liquids. No coughing. Denies fevers or chills. No history of GERD. History of 2-3 cigarettes daily for 20 years. No history of alcohol use though did drink socially up until a few years ago. Recently finding of thyroid nodule (TR5) felt appropriate for biopsy but FNA unsuccessful and patient transitioned to surveillance (repeat imaging at 1 year). No obvious deformity at neck, no thyromegaly/masses. Symptoms consistent with esophageal dysphagia.  -ambulatory referral to GI     Patient discussed with Dr. Heber Stronach

## 2022-10-10 NOTE — Assessment & Plan Note (Signed)
Patient presents with 2 weeks of feeling of food getting stuck in throat and sore throat with swallowing. Reports gradual onset. Denies history of any such thing. Can only eat soup, applesauce. Couldn't eat mashed potatoes yesterday due to globus sensation and regurgitating. Symptoms noticeable from throat down to sternum. No problems with liquids. No coughing. Denies fevers or chills. No history of GERD. History of 2-3 cigarettes daily for 20 years. No history of alcohol use though did drink socially up until a few years ago. Recently finding of thyroid nodule (TR5) felt appropriate for biopsy but FNA unsuccessful and patient transitioned to surveillance (repeat imaging at 1 year). No obvious deformity at neck, no thyromegaly/masses. Symptoms consistent with esophageal dysphagia.  -ambulatory referral to GI

## 2022-10-13 NOTE — Progress Notes (Signed)
Internal Medicine Clinic Attending  Case discussed with the resident at the time of the visit.  We reviewed the resident's history and exam and pertinent patient test results.  I agree with the assessment, diagnosis, and plan of care documented in the resident's note.  

## 2022-10-24 ENCOUNTER — Other Ambulatory Visit: Payer: Self-pay | Admitting: Gastroenterology

## 2022-11-03 ENCOUNTER — Ambulatory Visit (INDEPENDENT_AMBULATORY_CARE_PROVIDER_SITE_OTHER): Payer: 59 | Admitting: Student

## 2022-11-03 ENCOUNTER — Other Ambulatory Visit: Payer: Self-pay | Admitting: Dietician

## 2022-11-03 ENCOUNTER — Encounter: Payer: Self-pay | Admitting: Dietician

## 2022-11-03 ENCOUNTER — Ambulatory Visit (INDEPENDENT_AMBULATORY_CARE_PROVIDER_SITE_OTHER): Payer: 59 | Admitting: Dietician

## 2022-11-03 ENCOUNTER — Telehealth: Payer: Self-pay | Admitting: *Deleted

## 2022-11-03 ENCOUNTER — Encounter: Payer: Self-pay | Admitting: Student

## 2022-11-03 VITALS — Wt 181.0 lb

## 2022-11-03 DIAGNOSIS — E1165 Type 2 diabetes mellitus with hyperglycemia: Secondary | ICD-10-CM

## 2022-11-03 MED ORDER — ACCU-CHEK SOFTCLIX LANCETS MISC: 3 refills | Status: DC

## 2022-11-03 MED ORDER — ACCU-CHEK SOFTCLIX LANCET DEV KIT: PACK | 1 refills | Status: DC

## 2022-11-03 NOTE — Telephone Encounter (Signed)
Pt request testing supplies.

## 2022-11-03 NOTE — Progress Notes (Signed)
   Diabetes Self-Management Education  Visit Type:  Annual Follow-Up  Appt. Start Time: 1:15  Appt. End Time: 2:00  11/03/2022  [name], identified by name and date of birth, is a 72 y.o. female with a diagnosis of Diabetes:  .   ASSESSMENT  She came for ozempic pen assessment to increase the dose. She understood well how to use it. Her BG today was 273. After eating 2 pieces of sausages with water. Her usually drinks are apple, orange juice and soda. Encourage light soda,diet soda, and water.We talked about plant base diet for her request.     Weight 181 lb (82.1 kg). Body mass index is 34.2 kg/m.    Diabetes Self-Management Education - 11/03/22 1300       Pre-Education Assessment   Patient understands using medications safely. Needs Review    Patient understands monitoring blood glucose, interpreting and using results Needs Review      Complications   Last HgB A1C per patient/outside source 10.5 %             Learning Objective:  Patient will have a greater understanding of diabetes self-management. Patient education plan is to attend individual and/or group sessions per assessed needs and concerns.   Plan:   Patient Instructions  Your blood sugar was '273mg'$ /dl today. This is high.   The plan to help lower it:  Increase your ozempic to 0.5 mg weekly this Saturday. Continue on this dose.  Check blood sugars once daily but alternate the time of day you check. A few times a week on morning before breakfast, a few times before lunch and a few time a week before dinner.  I'll ask the doctor for a prescription for a new Accu SOFTCLIX lancing device and lancets.   Suggest drinking water as much as possible or a drink with no calories.   I recommend we check in for your diabetes once a year.   [name] [phone]    Expected Outcomes:     Education material provided: Diabetes Resources  If problems or questions, patient to contact team via:   Phone Debera Lat, [place] 11/03/2022 2:17 PM.   Future DSME appointment: -

## 2022-11-03 NOTE — Telephone Encounter (Signed)
Call from [place] with [facility] insurance.  Spoke with patient this morning is currently taking .25 mg of the Ozempic.  Patient stated did not know how to go up to .'5mg'$ .  Confusion as how to use dosing on pen.  Patient is losing weight as planned.  Call to patient after checking with [facility].  Patient offered appointment for this afternoon to do assessment of Ozempic dosing and CGM.  Patient to come in at 1:15 PM this afternoon. Asked to bring Ozempic and CGM equipment.

## 2022-11-03 NOTE — Patient Instructions (Signed)
Your blood sugar was '273mg'$ /dl today. This is high.   The plan to help lower it:  Increase your ozempic to 0.5 mg weekly this Saturday. Continue on this dose.  Check blood sugars once daily but alternate the time of day you check. A few times a week on morning before breakfast, a few times before lunch and a few time a week before dinner.  I'll ask the doctor for a prescription for a new Accu SOFTCLIX lancing device and lancets.   Suggest drinking water as much as possible or a drink with no calories.   I recommend we check in for your diabetes once a year.   Ally [phone]

## 2022-11-03 NOTE — Progress Notes (Deleted)
   CC: leg swelling  HPI:  [name] is a 72 y.o. female with history listed below presenting to the [facility] for leg swelling. Please see individualized problem based charting for full HPI.  Past Medical History:  Diagnosis Date   ATN (acute tubular necrosis) ([place]) 07/15/2014   Automatic implantable cardioverter-defibrillator in situ    Automatic implantable cardioverter-defibrillator in situ 05/04/2007   Qualifier: Diagnosis of  By: Isla Pence     Cardiac defibrillator in situ    Atlas II VR (SJM) implanted by Dr Lovena Le   CHF (congestive heart failure) ([place])    Chronic combined systolic and diastolic heart failure ([place])    a. EF 35-40% in past;  b. Echo 7/13:  EF 45-50%, Gr 2 diast dysfn, mild AI, mild MAC, trivial MR, mild LAE, PASP 47.   Chronic ulcer of leg ([place])    04-09-15 resolved-not a problem.   Colon polyps 04/12/2013   Rectosigmoid polyp   COPD (chronic obstructive pulmonary disease) (HCC)    O2 at night   Depression    Diabetes mellitus    Diabetes mellitus, type 2 ([place]) 04/03/2012   HIGH RISK FEET.. Please have patient take shoes and socks off every visit for visual foot inspection.     Eczema    Elevated alkaline phosphatase level    GGT and 5'nucleotidase 8/13 normal   Health care maintenance 01/22/2013   Surgically absent cervix- no pap needed (Path report 07/2000: uterine body with attached bilateral  adnexa and separate cervix.)   History of oxygen administration    oxygen @ 2 l/m nasally bedtime 24/7   HTN (hypertension)    Hx of cardiac cath    a. [place] 2003 normal;  b. LHC 6/13:  Mild calcification in the LM, o/w normal coronary arteries, EF 45%.    Hyperkalemia 08/08/2017   Hyperlipidemia    Elevated triglyceride in 2019   Hyperlipidemia associated with type 2 diabetes mellitus ([place]) 05/25/2007   Qualifier: Diagnosis of  By: Hassell Done FNP, Nykedtra     Hyperthyroidism, subclinical    HYPERTHYROIDISM, SUBCLINICAL 05/06/2009   Qualifier: Diagnosis of  By:  Darrick Meigs     Implantable cardioverter-defibrillator (ICD) generator end of life    Lipoma    Need for hepatitis C screening test 04/25/2019   NICM (nonischemic cardiomyopathy) ([place])    Obesity    On home oxygen therapy    "2L; 24/7" (10/11/2016)   Post menopausal syndrome    Shortness of breath 07/14/2017   Skin rash 07/12/2018   Sleep apnea    pt denies 04/12/2013    Review of Systems:  Negative aside from that listed in individualized problem based charting.  Physical Exam:  There were no vitals filed for this visit. ***  Assessment & Plan:   See Encounters Tab for problem based charting.  Patient {GC/GE:[id number]::"discussed with","seen with"} Dr. {NAMES:[id number]::"Butcher","Guilloud","Hoffman","Mullen","Narendra","Raines","Vincent"}

## 2022-11-11 ENCOUNTER — Encounter: Payer: Self-pay | Admitting: Internal Medicine

## 2022-11-17 ENCOUNTER — Encounter (HOSPITAL_COMMUNITY): Payer: Self-pay | Admitting: Gastroenterology

## 2022-11-18 NOTE — Progress Notes (Signed)
Attempted to obtain medical history via telephone, unable to reach at this time. HIPAA compliant voicemail message left requesting return call to pre surgical testing department. 

## 2022-11-24 NOTE — Anesthesia Preprocedure Evaluation (Addendum)
Anesthesia Evaluation  Patient identified by MRN, date of birth, ID band Patient awake    Reviewed: Allergy & Precautions, NPO status , Patient's Chart, lab work & pertinent test results  History of Anesthesia Complications Negative for: history of anesthetic complications  Airway Mallampati: II  TM Distance: >3 FB Neck ROM: Full    Dental  (+) Edentulous Upper, Edentulous Lower   Pulmonary neg shortness of breath, sleep apnea , COPD (2L O2 at night and PRN during the day),  oxygen dependent, neg recent URI, Current Smoker and Patient abstained from smoking.   Pulmonary exam normal breath sounds clear to auscultation       Cardiovascular hypertension (isosorbide-hydralazine, torsemide), (-) angina +CHF (NICM, EF 25-30%)  (-) Past MI, (-) Cardiac Stents and (-) CABG + dysrhythmias (on amiodarone, prolonged QT) Atrial Fibrillation + Cardiac Defibrillator + Valvular Problems/Murmurs (mild-moderate MR, severe TR)  Rhythm:Regular Rate:Normal  HLD  RHC 08/03/2022: Assessment: 1. Well compensated fluid status with normal cardiac output 2. Mild PH  TTE 07/27/2022: IMPRESSIONS     1. No thrombus seen with Definity contrast. Left ventricular ejection  fraction, by estimation, is 25 to 30%. Left ventricular ejection fraction  by 3D volume is 26 %. The left ventricle has severely decreased function.  The left ventricle demonstrates  global hypokinesis. Left ventricular diastolic function could not be  evaluated. There is the interventricular septum is flattened in diastole  ('D' shaped left ventricle), consistent with right ventricular volume  overload. The average left ventricular  global longitudinal strain is -6.8 %. The global longitudinal strain is  abnormal.   2. Right ventricular systolic function is low normal. The right  ventricular size is mildly enlarged. There is moderately elevated  pulmonary artery systolic pressure.    3. Left atrial size was severely dilated.   4. Right atrial size was severely dilated.   5. The mitral valve is normal in structure. Mild to moderate mitral valve  regurgitation. Moderate mitral annular calcification.   6. Tricuspid valve regurgitation is severe.   7. The aortic valve is tricuspid. There is mild calcification of the  aortic valve. Aortic valve regurgitation is trivial. Aortic valve  sclerosis/calcification is present, without any evidence of aortic  stenosis.   8. The inferior vena cava is dilated in size with <50% respiratory  variability, suggesting right atrial pressure of 15 mmHg.   LHC 06/21/2016: 1. Angiographically normal coronary arteries 2. Elevated LVEDP (22 mmHg)      Neuro/Psych neg Seizures PSYCHIATRIC DISORDERS  Depression     Neuromuscular disease (lumbar spinal stenosis)    GI/Hepatic ,neg GERD  ,,Hepatic steatosis Gastroparesis, dysphagia   Endo/Other  diabetes (Hgb A1c 10.5), Poorly Controlled, Type 2 Hyperthyroidism   Renal/GU CRFRenal disease     Musculoskeletal Osteoporosis    Abdominal  (+) + obese  Peds  Hematology negative hematology ROS (+)   Anesthesia Other Findings Eczema  Last Ozempic: 11/19/2022  Reproductive/Obstetrics                             Anesthesia Physical Anesthesia Plan  ASA: 4  Anesthesia Plan: MAC   Post-op Pain Management:    Induction: Intravenous  PONV Risk Score and Plan: 1 and Propofol infusion and Treatment may vary due to age or medical condition  Airway Management Planned: Natural Airway and Nasal Cannula  Additional Equipment:   Intra-op Plan:   Post-operative Plan:   Informed Consent: I have reviewed  the patients History and Physical, chart, labs and discussed the procedure including the risks, benefits and alternatives for the proposed anesthesia with the patient or authorized representative who has indicated his/her understanding and acceptance.        Plan Discussed with: CRNA and Anesthesiologist  Anesthesia Plan Comments: (Magnet available.  Discussed with patient risks of MAC including, but not limited to, minor pain or discomfort, hearing people in the room, and possible need for backup general anesthesia. Risks for general anesthesia also discussed including, but not limited to, sore throat, hoarse voice, chipped/damaged teeth, injury to vocal cords, nausea and vomiting, allergic reactions, lung infection, heart attack, stroke, and death. All questions answered. )        Anesthesia Quick Evaluation

## 2022-11-25 ENCOUNTER — Ambulatory Visit (HOSPITAL_COMMUNITY)
Admission: RE | Admit: 2022-11-25 | Discharge: 2022-11-25 | Disposition: A | Discharge status: Home / Self Care | Payer: 59 | Attending: Gastroenterology | Admitting: Gastroenterology

## 2022-11-25 ENCOUNTER — Ambulatory Visit (HOSPITAL_COMMUNITY): Payer: 59 | Admitting: Anesthesiology

## 2022-11-25 ENCOUNTER — Encounter (HOSPITAL_COMMUNITY): Payer: Self-pay | Admitting: Gastroenterology

## 2022-11-25 ENCOUNTER — Other Ambulatory Visit: Payer: Self-pay

## 2022-11-25 ENCOUNTER — Ambulatory Visit (HOSPITAL_BASED_OUTPATIENT_CLINIC_OR_DEPARTMENT_OTHER): Payer: 59 | Admitting: Anesthesiology

## 2022-11-25 ENCOUNTER — Encounter (HOSPITAL_COMMUNITY)
Admission: RE | Disposition: A | Discharge status: Home / Self Care | Payer: Self-pay | Source: Home / Self Care | Attending: Gastroenterology

## 2022-11-25 DIAGNOSIS — Z9981 Dependence on supplemental oxygen: Secondary | ICD-10-CM | POA: Diagnosis not present

## 2022-11-25 DIAGNOSIS — J449 Chronic obstructive pulmonary disease, unspecified: Secondary | ICD-10-CM | POA: Diagnosis not present

## 2022-11-25 DIAGNOSIS — R1310 Dysphagia, unspecified: Secondary | ICD-10-CM

## 2022-11-25 DIAGNOSIS — N189 Chronic kidney disease, unspecified: Secondary | ICD-10-CM | POA: Diagnosis not present

## 2022-11-25 DIAGNOSIS — I5042 Chronic combined systolic (congestive) and diastolic (congestive) heart failure: Secondary | ICD-10-CM | POA: Diagnosis not present

## 2022-11-25 DIAGNOSIS — Z79899 Other long term (current) drug therapy: Secondary | ICD-10-CM | POA: Diagnosis not present

## 2022-11-25 DIAGNOSIS — Z95810 Presence of automatic (implantable) cardiac defibrillator: Secondary | ICD-10-CM | POA: Insufficient documentation

## 2022-11-25 DIAGNOSIS — I5022 Chronic systolic (congestive) heart failure: Secondary | ICD-10-CM

## 2022-11-25 DIAGNOSIS — I130 Hypertensive heart and chronic kidney disease with heart failure and stage 1 through stage 4 chronic kidney disease, or unspecified chronic kidney disease: Secondary | ICD-10-CM | POA: Diagnosis not present

## 2022-11-25 DIAGNOSIS — I4891 Unspecified atrial fibrillation: Secondary | ICD-10-CM | POA: Insufficient documentation

## 2022-11-25 DIAGNOSIS — E1165 Type 2 diabetes mellitus with hyperglycemia: Secondary | ICD-10-CM | POA: Insufficient documentation

## 2022-11-25 DIAGNOSIS — E1122 Type 2 diabetes mellitus with diabetic chronic kidney disease: Secondary | ICD-10-CM | POA: Insufficient documentation

## 2022-11-25 DIAGNOSIS — Z794 Long term (current) use of insulin: Secondary | ICD-10-CM

## 2022-11-25 DIAGNOSIS — N184 Chronic kidney disease, stage 4 (severe): Secondary | ICD-10-CM

## 2022-11-25 DIAGNOSIS — Z7985 Long-term (current) use of injectable non-insulin antidiabetic drugs: Secondary | ICD-10-CM

## 2022-11-25 DIAGNOSIS — F17210 Nicotine dependence, cigarettes, uncomplicated: Secondary | ICD-10-CM | POA: Insufficient documentation

## 2022-11-25 HISTORY — PX: ESOPHAGOGASTRODUODENOSCOPY (EGD) WITH PROPOFOL: SHX5813

## 2022-11-25 HISTORY — PX: SAVORY DILATION: SHX5439

## 2022-11-25 LAB — GLUCOSE, CAPILLARY: Glucose-Capillary: 142 mg/dL — ABNORMAL HIGH (ref 70–99)

## 2022-11-25 SURGERY — ESOPHAGOGASTRODUODENOSCOPY (EGD) WITH PROPOFOL
Anesthesia: Monitor Anesthesia Care

## 2022-11-25 MED ORDER — SODIUM CHLORIDE 0.9 % IV SOLN
INTRAVENOUS | Status: DC
  Administered 2022-11-25: 500 mL via INTRAVENOUS

## 2022-11-25 MED ORDER — PROPOFOL 500 MG/50ML IV EMUL
INTRAVENOUS | Status: DC | PRN
  Administered 2022-11-25: 150 ug/kg/min via INTRAVENOUS

## 2022-11-25 MED ORDER — PROPOFOL 10 MG/ML IV BOLUS
INTRAVENOUS | Status: DC | PRN
  Administered 2022-11-25: 20 mg via INTRAVENOUS
  Administered 2022-11-25: 80 mg via INTRAVENOUS

## 2022-11-25 MED ORDER — LIDOCAINE 2% (20 MG/ML) 5 ML SYRINGE
INTRAMUSCULAR | Status: DC | PRN
  Administered 2022-11-25: 100 mg via INTRAVENOUS

## 2022-11-25 SURGICAL SUPPLY — 15 items

## 2022-11-25 NOTE — H&P (Signed)
[name] HPI: The patient complains about solid food dysphagia since the middle of November.  The symptoms are intermittent and she did vomit in the past.  She thinks she lost weight with her dysphagia.  At times she will have problems with swallowing liquids.  She has a long history of smoking since the age of 62.  The patient uses oxygen QHS.  Past Medical History:  Diagnosis Date   Acute metabolic encephalopathy 123456   ATN (acute tubular necrosis) ([place]) 07/15/2014   Automatic implantable cardioverter-defibrillator in situ    Automatic implantable cardioverter-defibrillator in situ 05/04/2007   Qualifier: Diagnosis of  By: Isla Pence     Cardiac defibrillator in situ    Atlas II VR (SJM) implanted by Dr Lovena Le   CHF (congestive heart failure) ([place])    Chronic combined systolic and diastolic heart failure ([place])    a. EF 35-40% in past;  b. Echo 7/13:  EF 45-50%, Gr 2 diast dysfn, mild AI, mild MAC, trivial MR, mild LAE, PASP 47.   Chronic ulcer of leg ([place])    04-09-15 resolved-not a problem.   Colon polyps 04/12/2013   Rectosigmoid polyp   COPD (chronic obstructive pulmonary disease) (HCC)    O2 at night   Depression    Diabetes mellitus    Diabetes mellitus, type 2 ([place]) 04/03/2012   HIGH RISK FEET.. Please have patient take shoes and socks off every visit for visual foot inspection.     Eczema    Elevated alkaline phosphatase level    GGT and 5'nucleotidase 8/13 normal   Health care maintenance 01/22/2013   Surgically absent cervix- no pap needed (Path report 07/2000: uterine body with attached bilateral  adnexa and separate cervix.)   History of oxygen administration    oxygen @ 2 l/m nasally bedtime 24/7   HTN (hypertension)    Hx of cardiac cath    a. [place] 2003 normal;  b. LHC 6/13:  Mild calcification in the LM, o/w normal coronary arteries, EF 45%.    Hyperkalemia 08/08/2017   Hyperlipidemia    Elevated triglyceride in 2019   Hyperlipidemia associated with  type 2 diabetes mellitus ([place]) 05/25/2007   Qualifier: Diagnosis of  By: Hassell Done FNP, Nykedtra     Hyperthyroidism, subclinical    HYPERTHYROIDISM, SUBCLINICAL 05/06/2009   Qualifier: Diagnosis of  By: Darrick Meigs     Implantable cardioverter-defibrillator (ICD) generator end of life    Lipoma    Need for hepatitis C screening test 04/25/2019   NICM (nonischemic cardiomyopathy) ([place])    Obesity    On home oxygen therapy    "2L; 24/7" (10/11/2016)   Post menopausal syndrome    Shortness of breath 07/14/2017   Skin rash 07/12/2018   Sleep apnea    pt denies 04/12/2013    Past Surgical History:  Procedure Laterality Date   ABDOMINAL HYSTERECTOMY     CARDIAC CATHETERIZATION     CARDIAC CATHETERIZATION N/A 06/21/2016   Procedure: Left Heart Cath and Coronary Angiography;  Surgeon: Sherren Mocha, MD;  Location: [place] [facility];  Service: Cardiovascular;  Laterality: N/A;   CARDIAC DEFIBRILLATOR PLACEMENT  05/04/2007   SJM Atlas II VR ICD   CARDIAC DEFIBRILLATOR PLACEMENT     CATARACT EXTRACTION     od   COLONOSCOPY N/A 04/12/2013   Procedure: COLONOSCOPY;  Surgeon: Beryle Beams, MD;  Location: [facility];  Service: Endoscopy;  Laterality: N/A;  pt.has defibrilator   COLONOSCOPY N/A 04/16/2015  Procedure: COLONOSCOPY;  Surgeon: [name], MD;  Location: [facility];  Service: Endoscopy;  Laterality: N/A;   ESOPHAGOGASTRODUODENOSCOPY N/A 04/16/2015   Procedure: ESOPHAGOGASTRODUODENOSCOPY (EGD);  Surgeon: [name], MD;  Location: [facility];  Service: Endoscopy;  Laterality: N/A;   EYE SURGERY     HERNIA REPAIR     HYSTEROSCOPY     IMPLANTABLE CARDIOVERTER DEFIBRILLATOR (ICD) GENERATOR CHANGE N/A 04/02/2014   Procedure: ICD GENERATOR CHANGE;  Surgeon: Evans Lance, MD;  Location: [facility];  Service: Cardiovascular;  Laterality: N/A;   INSERT / REPLACE / REMOVE PACEMAKER     LEFT HEART CATHETERIZATION WITH CORONARY ANGIOGRAM N/A 04/02/2012   Procedure: LEFT HEART  CATHETERIZATION WITH CORONARY ANGIOGRAM;  Surgeon: Hillary Bow, MD;  Location: [facility] CATH LAB;  Service: Cardiovascular;  Laterality: N/A;   ORIF ANKLE FRACTURE Right 08/12/2019   Procedure: OPEN REDUCTION INTERNAL FIXATION (ORIF) RIGHT TRIMALLEOLAR ANKLE FRACTURE;  Surgeon: Leandrew Koyanagi, MD;  Location: [place];  Service: Orthopedics;  Laterality: Right;   RIGHT HEART CATH N/A 08/03/2022   Procedure: RIGHT HEART CATH;  Surgeon: Jolaine Artist, MD;  Location: [place] CV [facility];  Service: Cardiovascular;  Laterality: N/A;   TEE WITHOUT CARDIOVERSION N/A 06/23/2022   Procedure: TRANSESOPHAGEAL ECHOCARDIOGRAM (TEE);  Surgeon: [name], MD;  Location: [place];  Service: Cardiology;  Laterality: N/A;   TONSILLECTOMY     TUBAL LIGATION      Family History  Problem Relation Age of Onset   Stroke Mother    Seizures Father    Heart disease Father    Diabetes Sister    Asthma Maternal Aunt        aunts   Asthma Maternal Uncle        uncles   Heart disease Paternal Aunt        aunts   Heart disease Paternal Uncle        uncles   Heart disease Maternal Aunt        aunts   Heart disease Maternal Uncle        uncles   Heart disease Maternal Grandfather    Breast cancer Daughter    Breast cancer Cousin    Asthma Grandchild    Colon cancer Neg Hx    Colon polyps Neg Hx    Esophageal cancer Neg Hx    Kidney disease Neg Hx    Gallbladder disease Neg Hx     Social History:  reports that she has been smoking cigarettes. She has a 22.50 pack-year smoking history. She has never used smokeless tobacco. She reports that she does not currently use alcohol after a past usage of about 3.0 standard drinks of alcohol per week. She reports that she does not use drugs.  Allergies:  Allergies  Allergen Reactions   Actos [Pioglitazone] Other (See Comments)    congestive heart failure    Naproxen Other (See Comments)    '500mg'$  dose made her sleep for two days   Rosiglitazone Hives    Tomato Itching   Hydrocodone-Acetaminophen Itching   Hydrocortisone Hives and Itching    Medications: Scheduled: Continuous:  sodium chloride      No results found. However, due to the size of the patient record, not all encounters were searched. Please check Results Review for a complete set of results.   No results found.  ROS:  As stated above in the HPI otherwise negative.  Height '4\' 11"'$  (1.499 m), weight 77.6 kg.  PE: Gen: NAD, Alert and Oriented HEENT:  /AT, EOMI Neck: Supple, no LAD Lungs: CTA Bilaterally CV: RRR without M/G/R ABD: Soft, NTND, +BS Ext: No C/C/E  Assessment/Plan: 1) Dysphagia - EGD with dilation.  Pola Furno D 11/25/2022, 8:54 AM

## 2022-11-25 NOTE — Discharge Instructions (Signed)

## 2022-11-25 NOTE — Transfer of Care (Signed)
Immediate Anesthesia Transfer of Care Note  Patient: [name]  Procedure(s) Performed: ESOPHAGOGASTRODUODENOSCOPY (EGD) WITH PROPOFOL SAVORY DILATION  Patient Location: Endoscopy Unit  Anesthesia Type:General  Level of Consciousness: drowsy  Airway & Oxygen Therapy: Patient Spontanous Breathing  Post-op Assessment: Report given to RN and Post -op Vital signs reviewed and stable  Post vital signs: Reviewed and stable  Last Vitals:  Vitals Value Taken Time  BP 117/45 11/25/22 0955  Temp 36.2 C 11/25/22 0955  Pulse 90 11/25/22 1000  Resp 25 11/25/22 1000  SpO2 92 % 11/25/22 1000  Vitals shown include unvalidated device data.  Last Pain:  Vitals:   11/25/22 0955  TempSrc: Temporal  PainSc:          Complications: No notable events documented.

## 2022-11-25 NOTE — Op Note (Signed)
[facility] Patient Name: [name] Procedure Date: 11/25/2022 MRN: [id number] Attending MD: Carol Ada , MD, [id number] Date of Birth: 03/15/1951 CSN: [id number] Age: 72 Admit Type: Outpatient Procedure:                Upper GI endoscopy Indications:              Dysphagia Providers:                Carol Ada, MD, Mikey College, RN, Brien Mates, Technician Referring MD:              Medicines:                Propofol per Anesthesia Complications:            No immediate complications. Estimated Blood Loss:     Estimated blood loss: none. Procedure:                Pre-Anesthesia Assessment:                           - Prior to the procedure, a History and Physical                            was performed, and patient medications and                            allergies were reviewed. The patient's tolerance of                            previous anesthesia was also reviewed. The risks                            and benefits of the procedure and the sedation                            options and risks were discussed with the patient.                            All questions were answered, and informed consent                            was obtained. Prior Anticoagulants: The patient has                            taken no anticoagulant or antiplatelet agents. ASA                            Grade Assessment: III - A patient with severe                            systemic disease. After reviewing the risks and                            benefits,  the patient was deemed in satisfactory                            condition to undergo the procedure.                           - Sedation was administered by an anesthesia                            professional. Deep sedation was attained.                           After obtaining informed consent, the endoscope was                            passed under direct vision. Throughout the                             procedure, the patient's blood pressure, pulse, and                            oxygen saturations were monitored continuously. The                            [id number] Olympus endoscope was introduced                            through the mouth, and advanced to the second part                            of duodenum. The upper GI endoscopy was                            accomplished without difficulty. The patient                            tolerated the procedure well. Scope In: Scope Out: Findings:      No endoscopic abnormality was evident in the esophagus to explain the       patient's complaint of dysphagia. It was decided, however, to proceed       with dilation of the entire esophagus. A guidewire was placed and the       scope was withdrawn. Dilation was performed with a Savary dilator with       no resistance at 18 mm. The dilation site was examined following       endoscope reinsertion and showed no change.      The stomach was normal.      The examined duodenum was normal.      Subjectively the GE junction was "spasmed" and there was some resistance       with passage of the endoscope. After the 18 mm Savary dilation the GE       junction was widely patent. Impression:               - No endoscopic esophageal abnormality to explain  patient's dysphagia. Esophagus dilated. Dilated.                           - Normal stomach.                           - Normal examined duodenum.                           - No specimens collected. Moderate Sedation:      Not Applicable - Patient had care per Anesthesia. Recommendation:           - Patient has a contact number available for                            emergencies. The signs and symptoms of potential                            delayed complications were discussed with the                            patient. Return to normal activities tomorrow.                             Written discharge instructions were provided to the                            patient.                           - Resume previous diet.                           - Continue present medications.                           - Follow up in one month. Procedure Code(s):        --- Professional ---                           [phone], Esophagogastroduodenoscopy, flexible,                            transoral; with insertion of guide wire followed by                            passage of dilator(s) through esophagus over guide                            wire Diagnosis Code(s):        --- Professional ---                           R13.10, Dysphagia, unspecified CPT copyright 2022 [employer]. All rights reserved. The codes documented in this report are preliminary and upon coder review may  be revised to meet current compliance requirements. Carol Ada, MD Carol Ada, MD 11/25/2022 9:54:44  AM This report has been signed electronically. Number of Addenda: 0

## 2022-11-25 NOTE — Anesthesia Postprocedure Evaluation (Signed)
Anesthesia Post Note  Patient: [name]  Procedure(s) Performed: ESOPHAGOGASTRODUODENOSCOPY (EGD) WITH PROPOFOL SAVORY DILATION     Patient location during evaluation: PACU Anesthesia Type: MAC Level of consciousness: awake Pain management: pain level controlled Vital Signs Assessment: post-procedure vital signs reviewed and stable Respiratory status: spontaneous breathing, nonlabored ventilation and respiratory function stable Cardiovascular status: stable and blood pressure returned to baseline Postop Assessment: no apparent nausea or vomiting Anesthetic complications: no   No notable events documented.  Last Vitals:  Vitals:   11/25/22 0955 11/25/22 1000  BP: (!) 117/45   Pulse: 92 91  Resp: (!) 25 20  Temp: (!) 36.2 C   SpO2: 92% 93%    Last Pain:  Vitals:   11/25/22 1000  TempSrc:   PainSc: 0-No pain                 Nilda Simmer

## 2022-11-28 ENCOUNTER — Encounter (HOSPITAL_COMMUNITY): Payer: Self-pay | Admitting: Gastroenterology

## 2022-12-09 ENCOUNTER — Encounter: Payer: Self-pay | Admitting: Student

## 2022-12-09 ENCOUNTER — Ambulatory Visit (INDEPENDENT_AMBULATORY_CARE_PROVIDER_SITE_OTHER): Payer: 59 | Admitting: Student

## 2022-12-09 ENCOUNTER — Ambulatory Visit (INDEPENDENT_AMBULATORY_CARE_PROVIDER_SITE_OTHER): Payer: 59

## 2022-12-09 VITALS — BP 161/84 | HR 81 | Temp 97.7°F | Ht 61.0 in | Wt 175.9 lb

## 2022-12-09 DIAGNOSIS — E1165 Type 2 diabetes mellitus with hyperglycemia: Secondary | ICD-10-CM

## 2022-12-09 DIAGNOSIS — I4892 Unspecified atrial flutter: Secondary | ICD-10-CM

## 2022-12-09 DIAGNOSIS — E1142 Type 2 diabetes mellitus with diabetic polyneuropathy: Secondary | ICD-10-CM

## 2022-12-09 DIAGNOSIS — Z7985 Long-term (current) use of injectable non-insulin antidiabetic drugs: Secondary | ICD-10-CM

## 2022-12-09 DIAGNOSIS — Z0000 Encounter for general adult medical examination without abnormal findings: Secondary | ICD-10-CM | POA: Diagnosis not present

## 2022-12-09 DIAGNOSIS — I5022 Chronic systolic (congestive) heart failure: Secondary | ICD-10-CM

## 2022-12-09 DIAGNOSIS — Z7984 Long term (current) use of oral hypoglycemic drugs: Secondary | ICD-10-CM

## 2022-12-09 LAB — POCT GLYCOSYLATED HEMOGLOBIN (HGB A1C): Hemoglobin A1C: 9 % — AB (ref 4.0–5.6)

## 2022-12-09 LAB — GLUCOSE, CAPILLARY: Glucose-Capillary: 186 mg/dL — ABNORMAL HIGH (ref 70–99)

## 2022-12-09 MED ORDER — METOPROLOL SUCCINATE ER 25 MG PO TB24: 25.0000 mg | ORAL_TABLET | Freq: Every day | ORAL | 2 refills | Status: DC

## 2022-12-09 MED ORDER — AMIODARONE HCL 200 MG PO TABS: 200.0000 mg | ORAL_TABLET | Freq: Every day | ORAL | 2 refills | Status: DC

## 2022-12-09 MED ORDER — PEN NEEDLES 32G X 4 MM MISC: 1.0000 | Freq: Every day | 1 refills | Status: DC

## 2022-12-09 MED ORDER — APIXABAN 2.5 MG PO TABS: 2.5000 mg | ORAL_TABLET | Freq: Two times a day (BID) | ORAL | 2 refills | Status: DC

## 2022-12-09 MED ORDER — INSULIN DEGLUDEC 100 UNIT/ML ~~LOC~~ SOPN: 5.0000 [IU] | PEN_INJECTOR | Freq: Every day | SUBCUTANEOUS | 2 refills | Status: AC

## 2022-12-09 NOTE — Assessment & Plan Note (Signed)
Currently rhythm controlled.  Eliquis was not on her medication list.  At last visit, her Eliquis dose was decreased to 2.5 mg twice daily.  Patient does not remember if somebody has stopped this medication.  She denies any bleeding episodes.  Eliquis may have fallen off her list by accident.  -Resume Eliquis 2.5 mg twice daily -Continue amiodarone and metoprolol as above

## 2022-12-09 NOTE — Assessment & Plan Note (Signed)
A1c improved from 10.5 to 9.  She report adherence to Farxiga 10 mg and Ozempic 0.5 mg.  Patient denies any issue administering these medications.  I am hesitating to increase Ozempic due to his CKD 4.  She was on insulin in the past but was discontinue due to hypoglycemia.  -Resume low-dose Tresiba 5 units daily -Advised patient to check his CBG daily and bring the glucometer follow-up visit in 4 weeks -Foot exam performed today -Patient has an eye doctor that she will reach out for an appointment -Patient could not provide urine sample for microalbumin today

## 2022-12-09 NOTE — Patient Instructions (Signed)
[name],  It was nice seeing you in the clinic today.  Here is a summary what we talked about  1.  For your diabetes, please continue Farxiga and Ozempic.  Please start injecting 5 units of Tresiba daily.  Please check your blood sugar fasting in the morning.  2.  I added metoprolol 25 mg to help with your blood pressure, heart rate and heart failure.  3.  I refilled your amiodarone and Eliquis  Please return in 1 month.  Make sure you bring your glucometer to the next visit  Dr. Alfonse Spruce

## 2022-12-09 NOTE — Assessment & Plan Note (Signed)
Patient has systolic heart failure with severely reduced ejection fraction status post ICD placement.  Patient appears euvolemic on exam today.  Current regimen includes BiDil and torsemide 40 mg twice daily.  She reports adherence to her regimen.  She did not take her medication this morning.  Her weight is stable.  Patient appears euvolemic on exam today.  Patient was on metoprolol succinate prior to her admission in October 2023.  I am not sure why her metoprolol was discontinued, could be due to hypotension?  She is currently on amiodarone for her a. flutter.  Her heart rate today is 85 and regular rhythm.  -Add Metoprolol succinate 25 mg daily -Avoid starting ACE/ARB due to CKD 4 -Continue BiDil and torsemide -Last BMP in December 2023 confirmed CKD 4 -Follow-up with cardiology as scheduled

## 2022-12-09 NOTE — Progress Notes (Signed)
CC: Follow-up on A1c  HPI:  [name] is a 72 y.o. living with hypertension, a flutter, HFrEF, COPD, type 2 diabetes and CKD 4 who presents to the clinic to have a repeat A1c.  Please see problems based charting for detail  Past Medical History:  Diagnosis Date   Acute metabolic encephalopathy [id number]   ATN (acute tubular necrosis) ([place]) 07/15/2014   Automatic implantable cardioverter-defibrillator in situ    Automatic implantable cardioverter-defibrillator in situ 05/04/2007   Qualifier: Diagnosis of  By: Isla Pence     Cardiac defibrillator in situ    Atlas II VR ([facility]) implanted by Dr Lovena Le   CHF (congestive heart failure) ([place])    Chronic combined systolic and diastolic heart failure ([place])    a. EF 35-40% in past;  b. Echo 7/13:  EF 45-50%, Gr 2 diast dysfn, mild AI, mild MAC, trivial MR, mild LAE, PASP 47.   Chronic ulcer of leg ([place])    04-09-15 resolved-not a problem.   Colon polyps 04/12/2013   Rectosigmoid polyp   COPD (chronic obstructive pulmonary disease) (HCC)    O2 at night   Depression    Diabetes mellitus    Diabetes mellitus, type 2 ([place]) 04/03/2012   HIGH RISK FEET.. Please have patient take shoes and socks off every visit for visual foot inspection.     Eczema    Elevated alkaline phosphatase level    GGT and 5'nucleotidase 8/13 normal   Health care maintenance 01/22/2013   Surgically absent cervix- no pap needed (Path report 07/2000: uterine body with attached bilateral  adnexa and separate cervix.)   History of oxygen administration    oxygen @ 2 l/m nasally bedtime 24/7   HTN (hypertension)    Hx of cardiac cath    a. [place] 2003 normal;  b. [facility] 6/13:  Mild calcification in the LM, o/w normal coronary arteries, EF 45%.    Hyperkalemia 08/08/2017   Hyperlipidemia    Elevated triglyceride in 2019   Hyperlipidemia associated with type 2 diabetes mellitus ([place]) 05/25/2007   Qualifier: Diagnosis of  By: Hassell Done FNP, [place]      Hyperthyroidism, subclinical    HYPERTHYROIDISM, SUBCLINICAL 05/06/2009   Qualifier: Diagnosis of  By: Darrick Meigs     Implantable cardioverter-defibrillator (ICD) generator end of life    Lipoma    Need for hepatitis C screening test 04/25/2019   NICM (nonischemic cardiomyopathy) ([place])    Obesity    On home oxygen therapy    "2L; 24/7" (10/11/2016)   Post menopausal syndrome    Shortness of breath 07/14/2017   Skin rash 07/12/2018   Sleep apnea    pt denies 04/12/2013   Review of Systems:  per HPI  Physical Exam:  Vitals:   12/09/22 0953 12/09/22 1046  BP: (!) 151/68 (!) 161/84  Pulse: 85 81  Temp: 97.7 F (36.5 C)   TempSrc: Oral   SpO2: 96%   Weight: 175 lb 14.4 oz (79.8 kg)   Height: '5\' 1"'$  (1.549 m)    Physical Exam Constitutional:      General: She is not in acute distress.    Appearance: She is not ill-appearing.  Eyes:     General:        Right eye: No discharge.        Left eye: No discharge.     Conjunctiva/sclera: Conjunctivae normal.  Cardiovascular:     Rate and Rhythm: Normal rate and regular rhythm.  Comments: Trace lower extremity edema.  No JVD Pulmonary:     Effort: Pulmonary effort is normal. No respiratory distress.     Breath sounds: Normal breath sounds. No wheezing.  Musculoskeletal:     Cervical back: Normal range of motion.  Skin:    General: Skin is warm.  Neurological:     Mental Status: She is alert. Mental status is at baseline.  Psychiatric:        Mood and Affect: Mood normal.      Assessment & Plan:   See Encounters Tab for problem based charting.  Chronic systolic CHF (congestive heart failure) (HCC) Patient has systolic heart failure with severely reduced ejection fraction status post ICD placement.  Patient appears euvolemic on exam today.  Current regimen includes BiDil and torsemide 40 mg twice daily.  She reports adherence to her regimen.  She did not take her medication this morning.  Her weight is stable.  Patient  appears euvolemic on exam today.  Patient was on metoprolol succinate prior to her admission in October 2023.  I am not sure why her metoprolol was discontinued, could be due to hypotension?  She is currently on amiodarone for her a. flutter.  Her heart rate today is 85 and regular rhythm.  -Add Metoprolol succinate 25 mg daily -Avoid starting ACE/ARB due to CKD 4 -Continue BiDil and torsemide -Last BMP in December 2023 confirmed CKD 4 -Follow-up with cardiology as scheduled  Uncontrolled type 2 diabetes mellitus with hyperglycemia ([place]) A1c improved from 10.5 to 9.  She report adherence to Farxiga 10 mg and Ozempic 0.5 mg.  Patient denies any issue administering these medications.  I am hesitating to increase Ozempic due to his CKD 4.  She was on insulin in the past but was discontinue due to hypoglycemia.  -Resume low-dose Tresiba 5 units daily -Advised patient to check his CBG daily and bring the glucometer follow-up visit in 4 weeks -Foot exam performed today -Patient has an eye doctor that she will reach out for an appointment -Patient could not provide urine sample for microalbumin today  Paroxysmal atrial flutter (HCC) Currently rhythm controlled.  Eliquis was not on her medication list.  At last visit, her Eliquis dose was decreased to 2.5 mg twice daily.  Patient does not remember if somebody has stopped this medication.  She denies any bleeding episodes.  Eliquis may have fallen off her list by accident.  -Resume Eliquis 2.5 mg twice daily -Continue amiodarone and metoprolol as above   Patient discussed with Dr. Daryll Drown

## 2022-12-09 NOTE — Progress Notes (Unsigned)
Subjective:   [name] is a 72 y.o. female who presents for Medicare Annual (Subsequent) preventive examination. I connected with  [name] on 12/09/22 by a  Face-To-Face  encounter and verified that I am speaking with the correct person using two identifiers.  Patient Location: Other:  Office/Clinic  Provider Location: Office/Clinic  I discussed the limitations of evaluation and management by telemedicine. The patient expressed understanding and agreed to proceed.  Review of Systems    Defer to PCP       Objective:    Today's Vitals   12/09/22 1105 12/09/22 1106 12/09/22 1107  BP: (!) 151/68 (!) 161/84   Pulse: 85 81   Temp: 97.7 F (36.5 C)    TempSrc: Oral    SpO2: 96%    Weight: 175 lb 14.4 oz (79.8 kg)    Height: '5\' 1"'$  (1.549 m)    PainSc:   0-No pain   Body mass index is 33.24 kg/m.     12/09/2022   11:06 AM 12/09/2022    9:57 AM 11/25/2022    8:56 AM 10/10/2022   10:38 AM 10/01/2022   11:44 AM 09/07/2022    9:22 AM 08/18/2022   10:05 AM  Advanced Directives  Does Patient Have a Medical Advance Directive? No No No No No No No  Would patient like information on creating a medical advance directive? No - Patient declined No - Patient declined No - Patient declined No - Patient declined No - Patient declined No - Patient declined No - Patient declined    Current Medications (verified) Outpatient Encounter Medications as of 12/09/2022  Medication Sig   Accu-Chek Softclix Lancets lancets Check BG as directed.   albuterol (VENTOLIN HFA) 108 (90 Base) MCG/ACT inhaler Inhale 2 puffs into the lungs every 6 (six) hours as needed for wheezing or shortness of breath.   amiodarone (PACERONE) 200 MG tablet Take 1 tablet (200 mg total) by mouth daily.   ANORO ELLIPTA 62.5-25 MCG/ACT AEPB INHALE 1 PUFF INTO THE LUNGS DAILY (Patient taking differently: Inhale 1 puff into the lungs in the morning.)   apixaban (ELIQUIS) 2.5 MG TABS tablet Take 1 tablet (2.5 mg total) by mouth  2 (two) times daily.   aspirin EC 81 MG tablet Take 81 mg by mouth daily. Swallow whole.   atorvastatin (LIPITOR) 80 MG tablet Take 80 mg by mouth at bedtime.   calcium-vitamin D (OSCAL WITH D) 500-200 MG-UNIT tablet Take 1 tablet by mouth 2 (two) times daily. (Patient taking differently: Take 1 tablet by mouth at bedtime.)   dapagliflozin propanediol (FARXIGA) 10 MG TABS tablet Take 1 tablet (10 mg total) by mouth daily.   Dupilumab (DUPIXENT) 300 MG/2ML SOPN Inject 300 mg into the skin every 14 (fourteen) days.   isosorbide-hydrALAZINE (BIDIL) 20-37.5 MG tablet Take 1 tablet by mouth 3 (three) times daily.   Lancets Misc. (ACCU-CHEK SOFTCLIX LANCET DEV) KIT Check BG as directed.   metoprolol succinate (TOPROL-XL) 25 MG 24 hr tablet Take 1 tablet (25 mg total) by mouth daily.   OVER THE COUNTER MEDICATION Apply 1 Application topically in the morning and at bedtime. OTC Cream for Eczema   potassium chloride SA (KLOR-CON M) 20 MEQ tablet Take 1 tablet (20 mEq total) by mouth daily.   Semaglutide,0.25 or 0.'5MG'$ /DOS, (OZEMPIC, 0.25 OR 0.5 MG/DOSE,) 2 MG/1.5ML SOPN Inject 0.5 mg into the skin once a week. (Patient taking differently: Inject 0.5 mg into the skin every Saturday.)   torsemide (  DEMADEX) 20 MG tablet Take 40 mg by mouth 2 (two) times daily.   No facility-administered encounter medications on file as of 12/09/2022.    Allergies (verified) Actos [pioglitazone], Naproxen, Rosiglitazone, Tomato, Hydrocodone-acetaminophen, and Hydrocortisone   History: Past Medical History:  Diagnosis Date   Acute metabolic encephalopathy [id number]   ATN (acute tubular necrosis) ([place]) 07/15/2014   Automatic implantable cardioverter-defibrillator in situ    Automatic implantable cardioverter-defibrillator in situ 05/04/2007   Qualifier: Diagnosis of  By: Isla Pence     Cardiac defibrillator in situ    Atlas II VR (SJM) implanted by Dr Lovena Le   CHF (congestive heart failure) ([place])    Chronic  combined systolic and diastolic heart failure ([place])    a. EF 35-40% in past;  b. Echo 7/13:  EF 45-50%, Gr 2 diast dysfn, mild AI, mild MAC, trivial MR, mild LAE, PASP 47.   Chronic ulcer of leg ([place])    04-09-15 resolved-not a problem.   Colon polyps 04/12/2013   Rectosigmoid polyp   COPD (chronic obstructive pulmonary disease) ([facility])    O2 at night   Depression    Diabetes mellitus    Diabetes mellitus, type 2 ([place]) 04/03/2012   HIGH RISK FEET.. Please have patient take shoes and socks off every visit for visual foot inspection.     Eczema    Elevated alkaline phosphatase level    GGT and 5'nucleotidase 8/13 normal   Health care maintenance 01/22/2013   Surgically absent cervix- no pap needed (Path report 07/2000: uterine body with attached bilateral  adnexa and separate cervix.)   History of oxygen administration    oxygen @ 2 l/m nasally bedtime 24/7   HTN (hypertension)    Hx of cardiac cath    a. [place] 2003 normal;  b. [facility] 6/13:  Mild calcification in the LM, o/w normal coronary arteries, EF 45%.    Hyperkalemia 08/08/2017   Hyperlipidemia    Elevated triglyceride in 2019   Hyperlipidemia associated with type 2 diabetes mellitus ([place]) 05/25/2007   Qualifier: Diagnosis of  By: Hassell Done FNP, [place]     Hyperthyroidism, subclinical    HYPERTHYROIDISM, SUBCLINICAL 05/06/2009   Qualifier: Diagnosis of  By: Darrick Meigs     Implantable cardioverter-defibrillator (ICD) generator end of life    Lipoma    Need for hepatitis C screening test 04/25/2019   NICM (nonischemic cardiomyopathy) ([place])    Obesity    On home oxygen therapy    "2L; 24/7" (10/11/2016)   Post menopausal syndrome    Shortness of breath 07/14/2017   Skin rash 07/12/2018   Sleep apnea    pt denies 04/12/2013   Past Surgical History:  Procedure Laterality Date   ABDOMINAL HYSTERECTOMY     CARDIAC CATHETERIZATION     CARDIAC CATHETERIZATION N/A 06/21/2016   Procedure: Left Heart Cath and Coronary Angiography;   Surgeon: Sherren Mocha, MD;  Location: [facility];  Service: Cardiovascular;  Laterality: N/A;   CARDIAC DEFIBRILLATOR PLACEMENT  05/04/2007   SJM Atlas II VR ICD   CARDIAC DEFIBRILLATOR PLACEMENT     CATARACT EXTRACTION     od   COLONOSCOPY N/A 04/12/2013   Procedure: COLONOSCOPY;  Surgeon: Beryle Beams, MD;  Location: [facility];  Service: Endoscopy;  Laterality: N/A;  pt.has defibrilator   COLONOSCOPY N/A 04/16/2015   Procedure: COLONOSCOPY;  Surgeon: [name], MD;  Location: [facility];  Service: Endoscopy;  Laterality: N/A;   ESOPHAGOGASTRODUODENOSCOPY N/A 04/16/2015   Procedure: ESOPHAGOGASTRODUODENOSCOPY (  EGD);  Surgeon: [name], MD;  Location: [facility];  Service: Endoscopy;  Laterality: N/A;   ESOPHAGOGASTRODUODENOSCOPY (EGD) WITH PROPOFOL N/A 11/25/2022   Procedure: ESOPHAGOGASTRODUODENOSCOPY (EGD) WITH PROPOFOL;  Surgeon: Carol Ada, MD;  Location: [facility];  Service: Gastroenterology;  Laterality: N/A;   EYE SURGERY     HERNIA REPAIR     HYSTEROSCOPY     IMPLANTABLE CARDIOVERTER DEFIBRILLATOR (ICD) GENERATOR CHANGE N/A 04/02/2014   Procedure: ICD GENERATOR CHANGE;  Surgeon: Evans Lance, MD;  Location[facility];  Service: Cardiovascular;  Laterality: N/A;   INSERT / REPLACE / REMOVE PACEMAKER     LEFT HEART CATHETERIZATION WITH CORONARY ANGIOGRAM N/A 04/02/2012   Procedure: LEFT HEART CATHETERIZATION WITH CORONARY ANGIOGRAM;  Surgeon: Hillary Bow, MD;  Location: [facility] CATH LAB;  Service: Cardiovascular;  Laterality: N/A;   ORIF ANKLE FRACTURE Right 08/12/2019   Procedure: OPEN REDUCTION INTERNAL FIXATION (ORIF) RIGHT TRIMALLEOLAR ANKLE FRACTURE;  Surgeon: Leandrew Koyanagi, MD;  Location: [place];  Service: Orthopedics;  Laterality: Right;   RIGHT HEART CATH N/A 08/03/2022   Procedure: RIGHT HEART CATH;  Surgeon: Jolaine Artist, MD;  Location: [place] [facility];  Service: Cardiovascular;  Laterality: N/A;   SAVORY DILATION N/A 11/25/2022    Procedure: SAVORY DILATION;  Surgeon: Carol Ada, MD;  Location: [facility];  Service: Gastroenterology;  Laterality: N/A;   TEE WITHOUT CARDIOVERSION N/A 06/23/2022   Procedure: TRANSESOPHAGEAL ECHOCARDIOGRAM (TEE);  Surgeon: [name], MD;  Location: [place];  Service: Cardiology;  Laterality: N/A;   TONSILLECTOMY     TUBAL LIGATION     Family History  Problem Relation Age of Onset   Stroke Mother    Seizures Father    Heart disease Father    Diabetes Sister    Asthma Maternal Aunt        aunts   Asthma Maternal Uncle        uncles   Heart disease Paternal Aunt        aunts   Heart disease Paternal Uncle        uncles   Heart disease Maternal Aunt        aunts   Heart disease Maternal Uncle        uncles   Heart disease Maternal Grandfather    Breast cancer Daughter    Breast cancer Cousin    Asthma Grandchild    Colon cancer Neg Hx    Colon polyps Neg Hx    Esophageal cancer Neg Hx    Kidney disease Neg Hx    Gallbladder disease Neg Hx    Social History   Socioeconomic History   Marital status: Significant Other    Spouse name: Not on file   Number of children: 3   Years of education: 11   Highest education level: 11th grade  Occupational History   Occupation: disabled  Tobacco Use   Smoking status: Every Day    Packs/day: 0.50    Years: 45.00    Total pack years: 22.50    Types: Cigarettes   Smokeless tobacco: Never   Tobacco comments:    currently smoking 2 cigs per day  Vaping Use   Vaping Use: Never used  Substance and Sexual Activity   Alcohol use: Not Currently    Alcohol/week: 3.0 standard drinks of alcohol    Types: 3 Shots of liquor per week   Drug use: No   Sexual activity: Yes    Partners: Male  Birth control/protection: Surgical  Other Topics Concern   Not on file  Social History Narrative   ** Merged History Encounter **       Married   Patient enjoys going to [employer], church and spending time with family    Social Determinants of Health   Financial Resource Strain: Low Risk  (12/09/2022)   Overall Financial Resource Strain (CARDIA)    Difficulty of Paying Living Expenses: Not hard at all  Food Insecurity: No Food Insecurity (12/09/2022)   Hunger Vital Sign    Worried About Running Out of Food in the Last Year: Never true    Ran Out of Food in the Last Year: Never true  Transportation Needs: No Transportation Needs (12/09/2022)   PRAPARE - Hydrologist (Medical): No    Lack of Transportation (Non-Medical): No  Physical Activity: Insufficiently Active (12/09/2022)   Exercise Vital Sign    Days of Exercise per Week: 2 days    Minutes of Exercise per Session: 10 min  Stress: Stress Concern Present (12/09/2022)   [place]    Feeling of Stress : To some extent  Social Connections: Moderately Integrated (12/09/2022)   Social Connection and Isolation Panel [NHANES]    Frequency of Communication with Friends and Family: More than three times a week    Frequency of Social Gatherings with Friends and Family: More than three times a week    Attends Religious Services: More than 4 times per year    Active Member of [employer] or Organizations: Yes    Attends Archivist Meetings: Never    Marital Status: Widowed    Tobacco Counseling Ready to quit: Not Answered Counseling given: Not Answered Tobacco comments: currently smoking 2 cigs per day   Clinical Intake:  Pre-visit preparation completed: Yes  Pain : No/denies pain Pain Score: 0-No pain     Diabetes: Yes CBG done?: Yes CBG resulted in Enter/ Edit results?: Yes Did pt. bring in CBG monitor from home?: No  How often do you need to have someone help you when you read instructions, pamphlets, or other written materials from your doctor or pharmacy?: 1 - Never What is the last grade level you completed in school?: 11th  grade  Diabetic?Nutrition Risk Assessment:  Has the patient had any N/V/D within the last 2 months?  No  Does the patient have any non-healing wounds?  No  Has the patient had any unintentional weight loss or weight gain?  No   Diabetes:  Is the patient diabetic?  Yes  If diabetic, was a CBG obtained today?  Yes  Did the patient bring in their glucometer from home?  No  How often do you monitor your CBG's? Every 3 months.   Financial Strains and Diabetes Management:  Are you having any financial strains with the device, your supplies or your medication? No .  Does the patient want to be seen by Chronic Care Management for management of their diabetes?  No  Would the patient like to be referred to a Nutritionist or for Diabetic Management?  No   Diabetic Exams:  Diabetic Eye Exam: Overdue for diabetic eye exam. Pt has been advised about the importance in completing this exam. Patient advised to call and schedule an eye exam. Diabetic Foot Exam: Completed 12/09/2022    Interpreter Needed?: No  Information entered by :: Midge Minium   Activities of Daily Living  12/09/2022   11:06 AM 12/09/2022    9:57 AM  In your present state of health, do you have any difficulty performing the following activities:  Hearing? 0 0  Vision? 0 0  Difficulty concentrating or making decisions? 0 0  Walking or climbing stairs? 1 1  Dressing or bathing? 0 0  Doing errands, shopping? 0 0    Patient Care Team: [name], MD as PCP - General Evans Lance, MD as PCP - Electrophysiology (Cardiology) Bensimhon, Shaune Pascal, MD as PCP - Advanced Heart Failure (Cardiology) Jola Schmidt, MD as Consulting Physician (Ophthalmology) Evans Lance, MD as Consulting Physician (Cardiology)  Indicate any recent Medical Services you may have received from other than [facility] providers in the past year (date may be approximate).     Assessment:   This is a routine wellness examination for  [name].  Hearing/Vision screen No results found.  Dietary issues and exercise activities discussed:     Goals Addressed   None   Depression Screen    12/09/2022   11:06 AM 10/10/2022   10:38 AM 08/18/2022   10:05 AM 05/23/2022   11:16 AM 12/21/2021    9:46 AM 11/23/2021   10:06 AM 05/24/2021    9:12 AM  PHQ 2/9 Scores  PHQ - 2 Score 0 0 0 0 0 0 0    Fall Risk    12/09/2022   11:06 AM 12/09/2022    9:56 AM 10/10/2022   10:38 AM 09/07/2022    9:21 AM 08/18/2022   10:04 AM  Fall Risk   Falls in the past year? '1 1 1 1 1  '$ Number falls in past yr: 0 0 0 1 1  Injury with Fall? 0 0 '1 1 1  '$ Comment   knee    Risk for fall due to : No Fall Risks  Impaired balance/gait History of fall(s);Impaired vision;Impaired mobility;Impaired balance/gait History of fall(s);Impaired balance/gait;Impaired mobility;Impaired vision  Follow up Falls evaluation completed;Falls prevention discussed Falls evaluation completed Falls evaluation completed;Falls prevention discussed Falls evaluation completed;Falls prevention discussed Falls evaluation completed;Falls prevention discussed    FALL RISK PREVENTION PERTAINING TO THE HOME:  Any stairs in or around the home? Yes  If so, are there any without handrails? No  Home free of loose throw rugs in walkways, pet beds, electrical cords, etc? Yes  Adequate lighting in your home to reduce risk of falls? Yes   ASSISTIVE DEVICES UTILIZED TO PREVENT FALLS:  Life alert? No  Use of a cane, walker or w/c? Yes  Grab bars in the bathroom? No  Shower chair or bench in shower? Yes  Elevated toilet seat or a handicapped toilet? No   TIMED UP AND GO:   Gait slow and steady with assistive device  Cognitive Function:        12/09/2022   11:07 AM  6CIT Screen  What Year? 0 points  What month? 0 points  What time? 0 points  Count back from 20 0 points  Months in reverse 0 points  Repeat phrase 0 points  Total Score 0 points    Immunizations Immunization  History  Administered Date(s) Administered   Fluad Quad(high Dose 65+) 06/24/2021, 08/18/2022   H1N1 10/27/2008   Influenza Whole 08/23/2007, 07/29/2009, 08/06/2010   Influenza,inj,Quad PF,6+ Mos 06/28/2013, 07/13/2014, 06/02/2015, 06/01/2016, 06/21/2017, 06/13/2018, 06/19/2019, 07/27/2020   Moderna SARS-COV2 Booster Vaccination 07/28/2020   Moderna Sars-Covid-2 Vaccination 11/25/2019, 12/31/2019   PNEUMOCOCCAL CONJUGATE-20 08/18/2022   Pneumococcal Polysaccharide-23 06/20/2008, 07/13/2014, 07/13/2015, 07/15/2017  Td 10/03/2001   Tdap 07/09/2013    TDAP status: Up to date  Flu Vaccine status: Up to date  Pneumococcal vaccine status: Up to date  Covid-19 vaccine status: Completed vaccines  Qualifies for Shingles Vaccine? No   Zostavax completed No   Shingrix Completed?: No.    Education has been provided regarding the importance of this vaccine. Patient has been advised to call insurance company to determine out of pocket expense if they have not yet received this vaccine. Advised may also receive vaccine at local pharmacy or Health Dept. Verbalized acceptance and understanding.  Screening Tests Health Maintenance  Topic Date Due   Zoster Vaccines- Shingrix (1 of 2) Never done   COVID-19 Vaccine (3 - Moderna risk series) 08/25/2020   OPHTHALMOLOGY EXAM  08/31/2022   Diabetic kidney evaluation - Urine ACR  11/23/2022   LIPID PANEL  11/23/2022   HEMOGLOBIN A1C  03/11/2023   DTaP/Tdap/Td (3 - Td or Tdap) 07/10/2023   Lung Cancer Screening  07/26/2023   Diabetic kidney evaluation - eGFR measurement  09/07/2023   FOOT EXAM  12/09/2023   Medicare Annual Wellness (AWV)  12/09/2023   MAMMOGRAM  03/21/2024   COLONOSCOPY (Pts 45-87yr Insurance coverage will need to be confirmed)  04/15/2025   Pneumonia Vaccine 70 Years old  Completed   INFLUENZA VACCINE  Completed   DEXA SCAN  Completed   Hepatitis C Screening  Completed   HPV VACCINES  Aged Out    Health  Maintenance  Health Maintenance Due  Topic Date Due   Zoster Vaccines- Shingrix (1 of 2) Never done   COVID-19 Vaccine (3 - Moderna risk series) 08/25/2020   OPHTHALMOLOGY EXAM  08/31/2022   Diabetic kidney evaluation - Urine ACR  11/23/2022   LIPID PANEL  11/23/2022    Colorectal cancer screening: Type of screening: Colonoscopy. Completed 04/16/2015. Repeat every 10 years  Mammogram status: Completed 03/21/2022. Repeat every year:2  Lung Cancer Screening: (Low Dose CT Chest recommended if Age 70-80years, 30 pack-year currently smoking OR have quit w/in 15years.) does not qualify.   Lung Cancer Screening Referral: N/A  Additional Screening:  Hepatitis C Screening: does not qualify; Completed 06/28/2022  Vision Screening: Recommended annual ophthalmology exams for early detection of glaucoma and other disorders of the eye. Is the patient up to date with their annual eye exam?  Yes  Who is the provider or what is the name of the office in which the patient attends annual eye exams? Dr.Bowden [address] If pt is not established with a provider, would they like to be referred to a provider to establish care? No .   Dental Screening: Recommended annual dental exams for proper oral hygiene  Community Resource Referral / Chronic Care Management: CRR required this visit?  No   CCM required this visit?  No      Plan:     I have personally reviewed and noted the following in the patient's chart:   Medical and social history Use of alcohol, tobacco or illicit drugs  Current medications and supplements including opioid prescriptions. Patient is not currently taking opioid prescriptions. Functional ability and status Nutritional status Physical activity Advanced directives List of other physicians Hospitalizations, surgeries, and ER visits in previous 12 months Vitals Screenings to include cognitive, depression, and falls Referrals and appointments  In addition, I  have reviewed and discussed with patient certain preventive protocols, quality metrics, and best practice recommendations. A written personalized care plan for preventive services as  well as general preventive health recommendations were provided to patient.     Kerin Perna, [facility]   12/09/2022   Nurse Notes: Face-To-Face Visit  [name] , Thank you for taking time to come for your Medicare Wellness Visit. I appreciate your ongoing commitment to your health goals. Please review the following plan we discussed and let me know if I can assist you in the future.   These are the goals we discussed:  Goals      Blood Pressure < 130/90     BP Readings from Last 3 Encounters:  05/06/20 (!) 110/57  04/29/20 (!) 134/60  04/08/20 (!) 155/67    Offered to assist patient with teaching her how to take her blood pressure the next time she is in the clinic.     HEMOGLOBIN A1C < 7     Lab Results  Component Value Date   HGBA1C 10.7 (A) 04/08/2020   HGBA1C 9.6 (A) 12/26/2019   HGBA1C 9.1 (H) 08/08/2019   Lab Results  Component Value Date   MICROALBUR 23.57 (H) 04/08/2014   LDLCALC 93 04/29/2020   CREATININE 0.84 04/08/2020     Not meeting Hgb A1C target     LDL CALC < 70     Lab Results  Component Value Date   CHOL 163 04/24/2019   HDL 76 04/24/2019   LDLCALC 55 04/24/2019   LDLDIRECT 122.3 03/06/2007   TRIG 160 (H) 04/24/2019   CHOLHDL 2.1 04/24/2019        Quit smoking / using tobacco     05/28/20 patient states she is smoking 3-4 cigarettes daily. Offered smoking cessation assistance; patient declined.  09/22/20 Unable to pursue further intervention for this care plan activity secondary to inability to maintain contact with the patient         This is a list of the screening recommended for you and due dates:  Health Maintenance  Topic Date Due   Zoster (Shingles) Vaccine (1 of 2) Never done   COVID-19 Vaccine (3 - Moderna risk series) 08/25/2020   Eye exam for diabetics   08/31/2022   Yearly kidney health urinalysis for diabetes  11/23/2022   Lipid (cholesterol) test  11/23/2022   Hemoglobin A1C  03/11/2023   DTaP/Tdap/Td vaccine (3 - Td or Tdap) 07/10/2023   Screening for Lung Cancer  07/26/2023   Yearly kidney function blood test for diabetes  09/07/2023   Complete foot exam   12/09/2023   Medicare Annual Wellness Visit  12/09/2023   Mammogram  03/21/2024   Colon Cancer Screening  04/15/2025   Pneumonia Vaccine  Completed   Flu Shot  Completed   DEXA scan (bone density measurement)  Completed   Hepatitis C Screening: USPSTF Recommendation to screen - Ages 71-70 yo.  Completed   HPV Vaccine  Aged Out

## 2022-12-12 NOTE — Addendum Note (Signed)
Addended byGaylan Gerold on: 12/12/2022 10:47 AM   Modules accepted: Level of Service

## 2022-12-15 NOTE — Progress Notes (Signed)
Internal Medicine Clinic Attending  Case and documentation of Dr. Nguyen  soon after the resident saw the patient reviewed.  I reviewed the AWV findings.  I agree with the assessment, diagnosis, and plan of care documented in the AWV note.     

## 2022-12-15 NOTE — Progress Notes (Signed)
Internal Medicine Clinic Attending  Case discussed with Dr. Nguyen  at the time of the visit.  We reviewed the resident's history and exam and pertinent patient test results.  I agree with the assessment, diagnosis, and plan of care documented in the resident's note.  

## 2023-01-07 NOTE — Progress Notes (Signed)
This visit was canceled by patient.

## 2023-01-10 ENCOUNTER — Other Ambulatory Visit: Payer: Self-pay

## 2023-01-10 ENCOUNTER — Ambulatory Visit (INDEPENDENT_AMBULATORY_CARE_PROVIDER_SITE_OTHER): Payer: 59 | Admitting: Student

## 2023-01-10 ENCOUNTER — Encounter: Payer: Self-pay | Admitting: Student

## 2023-01-10 VITALS — BP 113/51 | HR 70 | Temp 97.6°F | Resp 28 | Ht 61.0 in | Wt 179.8 lb

## 2023-01-10 DIAGNOSIS — E1165 Type 2 diabetes mellitus with hyperglycemia: Secondary | ICD-10-CM | POA: Diagnosis not present

## 2023-01-10 DIAGNOSIS — E785 Hyperlipidemia, unspecified: Secondary | ICD-10-CM | POA: Diagnosis not present

## 2023-01-10 DIAGNOSIS — I110 Hypertensive heart disease with heart failure: Secondary | ICD-10-CM | POA: Diagnosis not present

## 2023-01-10 DIAGNOSIS — I5022 Chronic systolic (congestive) heart failure: Secondary | ICD-10-CM | POA: Diagnosis not present

## 2023-01-10 DIAGNOSIS — I10 Essential (primary) hypertension: Secondary | ICD-10-CM

## 2023-01-10 DIAGNOSIS — Z794 Long term (current) use of insulin: Secondary | ICD-10-CM

## 2023-01-10 DIAGNOSIS — Z7985 Long-term (current) use of injectable non-insulin antidiabetic drugs: Secondary | ICD-10-CM

## 2023-01-10 DIAGNOSIS — Z7984 Long term (current) use of oral hypoglycemic drugs: Secondary | ICD-10-CM

## 2023-01-10 NOTE — Assessment & Plan Note (Addendum)
A1c last month 9.0. She is currently on Farxiga 10 mg, Ozempic 0.5 mg and Tresiba 5 units daily (added on at LOV). Patient is checking her glucose most mornings. Glucose readings have mostly been <140. No hypoglycemic readings and patient denies any hypoglycemic episodes. Patient states she completed annual eye exam in 08/2022. Unable to provide urine sample today for micoalbumin.   Plan -Continue Loura Pardon and Tresiba -Continue glucose monitoring, discussed notifying clinic if having hypoglycemia -repeat A1c in 2 months

## 2023-01-10 NOTE — Progress Notes (Signed)
CC: diabetes f/u  HPI:  [name] is a 72 y.o. female living with a history stated below and presents today for diabetes f/u. Please see problem based assessment and plan for additional details.  Past Medical History:  Diagnosis Date   Acute metabolic encephalopathy 07/26/2022   ATN (acute tubular necrosis) 07/15/2014   Automatic implantable cardioverter-defibrillator in situ    Automatic implantable cardioverter-defibrillator in situ 05/04/2007   Qualifier: Diagnosis of  By: Levon Hedger     Cardiac defibrillator in situ    Atlas II VR (SJM) implanted by Dr Ladona Ridgel   CHF (congestive heart failure)    Chronic combined systolic and diastolic heart failure    a. EF 35-40% in past;  b. Echo 7/13:  EF 45-50%, Gr 2 diast dysfn, mild AI, mild MAC, trivial MR, mild LAE, PASP 47.   Chronic ulcer of leg    04-09-15 resolved-not a problem.   Colon polyps 04/12/2013   Rectosigmoid polyp   COPD (chronic obstructive pulmonary disease)    O2 at night   Depression    Diabetes mellitus    Diabetes mellitus, type 2 04/03/2012   HIGH RISK FEET.. Please have patient take shoes and socks off every visit for visual foot inspection.     Eczema    Elevated alkaline phosphatase level    GGT and 5'nucleotidase 8/13 normal   Health care maintenance 01/22/2013   Surgically absent cervix- no pap needed (Path report 07/2000: uterine body with attached bilateral  adnexa and separate cervix.)   History of oxygen administration    oxygen @ 2 l/m nasally bedtime 24/7   HTN (hypertension)    Hx of cardiac cath    a. LHC 2003 normal;  b. LHC 6/13:  Mild calcification in the LM, o/w normal coronary arteries, EF 45%.    Hyperkalemia 08/08/2017   Hyperlipidemia    Elevated triglyceride in 2019   Hyperlipidemia associated with type 2 diabetes mellitus 05/25/2007   Qualifier: Diagnosis of  By: Daphine Deutscher FNP, [place]     Hyperthyroidism, subclinical    HYPERTHYROIDISM, SUBCLINICAL 05/06/2009    Qualifier: Diagnosis of  By: Flonnie Overman     Implantable cardioverter-defibrillator (ICD) generator end of life    Lipoma    Need for hepatitis C screening test 04/25/2019   NICM (nonischemic cardiomyopathy)    Obesity    On home oxygen therapy    "2L; 24/7" (10/11/2016)   Post menopausal syndrome    Shortness of breath 07/14/2017   Skin rash 07/12/2018   Sleep apnea    pt denies 04/12/2013    Current Outpatient Medications on File Prior to Visit  Medication Sig Dispense Refill   Accu-Chek Softclix Lancets lancets Check BG as directed. 100 each 3   albuterol (VENTOLIN HFA) 108 (90 Base) MCG/ACT inhaler Inhale 2 puffs into the lungs every 6 (six) hours as needed for wheezing or shortness of breath. 8 g 2   amiodarone (PACERONE) 200 MG tablet Take 1 tablet (200 mg total) by mouth daily. 90 tablet 2   ANORO ELLIPTA 62.5-25 MCG/ACT AEPB INHALE 1 PUFF INTO THE LUNGS DAILY (Patient taking differently: Inhale 1 puff into the lungs in the morning.) 60 each 2   apixaban (ELIQUIS) 2.5 MG TABS tablet Take 1 tablet (2.5 mg total) by mouth 2 (two) times daily. 60 tablet 2   aspirin EC 81 MG tablet Take 81 mg by mouth daily. Swallow whole.     atorvastatin (LIPITOR) 80 MG tablet Take  80 mg by mouth at bedtime.     calcium-vitamin D (OSCAL WITH D) 500-200 MG-UNIT tablet Take 1 tablet by mouth 2 (two) times daily. (Patient taking differently: Take 1 tablet by mouth at bedtime.) 90 tablet 6   dapagliflozin propanediol (FARXIGA) 10 MG TABS tablet Take 1 tablet (10 mg total) by mouth daily. 90 tablet 1   Dupilumab (DUPIXENT) 300 MG/2ML SOPN Inject 300 mg into the skin every 14 (fourteen) days.     insulin degludec (TRESIBA) 100 UNIT/ML FlexTouch Pen Inject 5 Units into the skin daily. 1.5 mL 2   Insulin Pen Needle (PEN NEEDLES) 32G X 4 MM MISC 1 Needle by Does not apply route daily. 100 each 1   isosorbide-hydrALAZINE (BIDIL) 20-37.5 MG tablet Take 1 tablet by mouth 3 (three) times daily. 90 tablet 3    Lancets Misc. (ACCU-CHEK SOFTCLIX LANCET DEV) KIT Check BG as directed. 1 kit 1   metoprolol succinate (TOPROL-XL) 25 MG 24 hr tablet Take 1 tablet (25 mg total) by mouth daily. 30 tablet 2   OVER THE COUNTER MEDICATION Apply 1 Application topically in the morning and at bedtime. OTC Cream for Eczema     potassium chloride SA (KLOR-CON M) 20 MEQ tablet Take 1 tablet (20 mEq total) by mouth daily. 90 tablet 3   Semaglutide,0.25 or 0.5MG /DOS, (OZEMPIC, 0.25 OR 0.5 MG/DOSE,) 2 MG/1.5ML SOPN Inject 0.5 mg into the skin once a week. (Patient taking differently: Inject 0.5 mg into the skin every Saturday.) 3 mL 1   torsemide (DEMADEX) 20 MG tablet Take 40 mg by mouth 2 (two) times daily.     No current facility-administered medications on file prior to visit.   Review of Systems: ROS negative except for what is noted on the assessment and plan.  Vitals:   01/10/23 1010  BP: (!) 113/51  Pulse: 70  Resp: (!) 28  Temp: 97.6 F (36.4 C)  TempSrc: Oral  SpO2: 98%  Weight: 179 lb 12.8 oz (81.6 kg)  Height: 5\' 1"  (1.549 m)   Physical Exam: Constitutional: well-appearing female sitting in wheelchair, in no acute distress HENT: normocephalic atraumatic Neck: supple Cardiovascular: regular rate and rhythm Pulmonary/Chest: normal work of breathing on room air, lungs clear to auscultation bilaterally MSK: normal bulk and tone Neurological: awake and alert  Skin: warm and dry Psych: pleasant mood   Assessment & Plan:   Uncontrolled type 2 diabetes mellitus with hyperglycemia (HCC) A1c last month 9.0. She is currently on Farxiga 10 mg, Ozempic 0.5 mg and Tresiba 5 units daily (added on at LOV). Patient is checking her glucose most mornings. Glucose readings have mostly been <140. No hypoglycemic readings and patient denies any hypoglycemic episodes. Patient states she completed annual eye exam in 08/2022. Unable to provide urine sample today for micoalbumin.   Plan -Continue Loura Pardon and  Tresiba -Continue glucose monitoring, discussed notifying clinic if having hypoglycemia -repeat A1c in 2 months  Essential hypertension BP well controlled today. Initial BP was 113/51 but repeat BP were in goal. Orthostatic vitals negative. Asymptomatic and denies any lightheaded or dizziness. She is on Farxiga 10 mg daily, torsemide 40 mg BID, BiDil 20-37.5 TID and metoprolol 25 mg daily.   Plan -continue with above regimen  Hyperlipidemia Lipid panel from 11/2021 showed LDL above goal, 117. She previously stated likely increase due to poor food choices from acute stressors then. She is on atorvastatin 80 mg daily and reports adherence. Will repeat lipid panel today. If not well controlled  with statin therapy, then consider additional agent.   Plan -repeat lipid panel -continue atorvastatin 80 mg daily    Patient discussed with Dr. Halina AndreasMachen  Ailyn Gladd, D.O. [facility], PGY-1 Phone: [phone] Date 01/10/2023 Time 8:04 PM

## 2023-01-10 NOTE — Patient Instructions (Addendum)
Thank you, [name] for allowing [place] to provide your care today. Today we discussed your diabetes. I am glad you are feeling well today.   Diabetes -continue Tresiba, Farxiga and Ozempic -please make sure to check your blood sugar daily, if you have any low sugar readings please call the clinic office -plan to recheck A1c in 2 months  Blood Pressure/Heart Failure -your blood pressure is well controlled today -continue BiDil, torsemide and metoprolol -you have a cardiology appointment on 03/03/2023 1:40 PM  Cholesterol -will check your cholesterol levels today, I will call you with the results -continue your atorvastatin (Lipitor)  I have ordered the following labs for you:  Lab Orders         Lipid Profile      I have ordered the following medication/changed the following medications:   Stop the following medications: There are no discontinued medications.   Start the following medications: No orders of the defined types were placed in this encounter.    Follow up: 2 months   Remember: Please make sure to check your blood sugar daily and if you notice any low sugar levels please call the clinic office.   Should you have any questions or concerns please call the internal medicine clinic at [phone].    Rana Snare, D.O. [facility]

## 2023-01-10 NOTE — Assessment & Plan Note (Addendum)
BP well controlled today. Initial BP was 113/51 but repeat BP were in goal. Orthostatic vitals negative. Asymptomatic and denies any lightheaded or dizziness. She is on Farxiga 10 mg daily, torsemide 40 mg BID, BiDil 20-37.5 TID and metoprolol 25 mg daily.   Plan -continue with above regimen

## 2023-01-10 NOTE — Assessment & Plan Note (Addendum)
Lipid panel from 11/2021 showed LDL above goal, 117. She previously stated likely increase due to poor food choices from acute stressors then. She is on atorvastatin 80 mg daily and reports adherence. Will repeat lipid panel today. If not well controlled with statin therapy, then consider additional agent.   Plan -repeat lipid panel -continue atorvastatin 80 mg daily

## 2023-01-11 ENCOUNTER — Encounter: Payer: Self-pay | Admitting: Student

## 2023-01-11 LAB — LIPID PANEL
Chol/HDL Ratio: 2.5 ratio (ref 0.0–4.4)
Cholesterol, Total: 145 mg/dL (ref 100–199)
HDL: 59 mg/dL (ref >39)
LDL Chol Calc (NIH): 72 mg/dL (ref 0–99)
Triglycerides: 70 mg/dL (ref 0–149)
VLDL Cholesterol Cal: 14 mg/dL (ref 5–40)

## 2023-01-12 NOTE — Progress Notes (Signed)
Internal Medicine Clinic Attending  Case discussed with Dr. Zheng  At the time of the visit.  We reviewed the resident's history and exam and pertinent patient test results.  I agree with the assessment, diagnosis, and plan of care documented in the resident's note.  

## 2023-01-12 NOTE — Addendum Note (Signed)
Addended by: Derrek Monaco on: 01/12/2023 08:14 PM   Modules accepted: Level of Service

## 2023-01-17 ENCOUNTER — Other Ambulatory Visit: Payer: Self-pay | Admitting: Gastroenterology

## 2023-03-01 ENCOUNTER — Observation Stay (HOSPITAL_COMMUNITY)
Admission: EM | Admit: 2023-03-01 | Discharge: 2023-03-02 | Disposition: A | Discharge status: Home / Self Care | Payer: 59 | Attending: Internal Medicine | Admitting: Internal Medicine

## 2023-03-01 ENCOUNTER — Encounter (HOSPITAL_COMMUNITY): Payer: Self-pay

## 2023-03-01 ENCOUNTER — Emergency Department (HOSPITAL_COMMUNITY): Payer: 59

## 2023-03-01 DIAGNOSIS — Z79899 Other long term (current) drug therapy: Secondary | ICD-10-CM | POA: Insufficient documentation

## 2023-03-01 DIAGNOSIS — Z794 Long term (current) use of insulin: Secondary | ICD-10-CM | POA: Insufficient documentation

## 2023-03-01 DIAGNOSIS — Z7982 Long term (current) use of aspirin: Secondary | ICD-10-CM | POA: Insufficient documentation

## 2023-03-01 DIAGNOSIS — I5020 Unspecified systolic (congestive) heart failure: Secondary | ICD-10-CM

## 2023-03-01 DIAGNOSIS — J449 Chronic obstructive pulmonary disease, unspecified: Secondary | ICD-10-CM | POA: Insufficient documentation

## 2023-03-01 DIAGNOSIS — I5042 Chronic combined systolic (congestive) and diastolic (congestive) heart failure: Secondary | ICD-10-CM | POA: Diagnosis not present

## 2023-03-01 DIAGNOSIS — Z7901 Long term (current) use of anticoagulants: Secondary | ICD-10-CM | POA: Diagnosis not present

## 2023-03-01 DIAGNOSIS — Z95810 Presence of automatic (implantable) cardiac defibrillator: Secondary | ICD-10-CM | POA: Diagnosis not present

## 2023-03-01 DIAGNOSIS — I480 Paroxysmal atrial fibrillation: Secondary | ICD-10-CM | POA: Diagnosis not present

## 2023-03-01 DIAGNOSIS — I5032 Chronic diastolic (congestive) heart failure: Secondary | ICD-10-CM

## 2023-03-01 DIAGNOSIS — I5023 Acute on chronic systolic (congestive) heart failure: Secondary | ICD-10-CM

## 2023-03-01 DIAGNOSIS — E1122 Type 2 diabetes mellitus with diabetic chronic kidney disease: Secondary | ICD-10-CM | POA: Diagnosis not present

## 2023-03-01 DIAGNOSIS — Z87891 Personal history of nicotine dependence: Secondary | ICD-10-CM | POA: Insufficient documentation

## 2023-03-01 DIAGNOSIS — I509 Heart failure, unspecified: Secondary | ICD-10-CM

## 2023-03-01 DIAGNOSIS — I130 Hypertensive heart and chronic kidney disease with heart failure and stage 1 through stage 4 chronic kidney disease, or unspecified chronic kidney disease: Secondary | ICD-10-CM | POA: Diagnosis not present

## 2023-03-01 DIAGNOSIS — R0602 Shortness of breath: Principal | ICD-10-CM | POA: Diagnosis present

## 2023-03-01 DIAGNOSIS — N184 Chronic kidney disease, stage 4 (severe): Secondary | ICD-10-CM | POA: Diagnosis not present

## 2023-03-01 LAB — BASIC METABOLIC PANEL
Anion gap: 16 — ABNORMAL HIGH (ref 5–15)
BUN: 34 mg/dL — ABNORMAL HIGH (ref 8–23)
CO2: 28 mmol/L (ref 22–32)
Calcium: 9.4 mg/dL (ref 8.9–10.3)
Chloride: 98 mmol/L (ref 98–111)
Creatinine, Ser: 2.11 mg/dL — ABNORMAL HIGH (ref 0.44–1.00)
GFR, Estimated: 25 mL/min — ABNORMAL LOW (ref >60)
Glucose, Bld: 247 mg/dL — ABNORMAL HIGH (ref 70–99)
Potassium: 3.5 mmol/L (ref 3.5–5.1)
Sodium: 142 mmol/L (ref 135–145)

## 2023-03-01 LAB — CBC WITH DIFFERENTIAL/PLATELET
Abs Immature Granulocytes: 0.02 K/uL (ref 0.00–0.07)
Basophils Absolute: 0.1 K/uL (ref 0.0–0.1)
Basophils Relative: 1 %
Eosinophils Absolute: 1 K/uL — ABNORMAL HIGH (ref 0.0–0.5)
Eosinophils Relative: 13 %
HCT: 44.5 % (ref 36.0–46.0)
Hemoglobin: 13.4 g/dL (ref 12.0–15.0)
Immature Granulocytes: 0 %
Lymphocytes Relative: 27 %
Lymphs Abs: 2 K/uL (ref 0.7–4.0)
MCH: 27.2 pg (ref 26.0–34.0)
MCHC: 30.1 g/dL (ref 30.0–36.0)
MCV: 90.4 fL (ref 80.0–100.0)
Monocytes Absolute: 0.5 K/uL (ref 0.1–1.0)
Monocytes Relative: 7 %
Neutro Abs: 3.8 K/uL (ref 1.7–7.7)
Neutrophils Relative %: 52 %
Platelets: 218 K/uL (ref 150–400)
RBC: 4.92 MIL/uL (ref 3.87–5.11)
RDW: 15.7 % — ABNORMAL HIGH (ref 11.5–15.5)
WBC: 7.4 K/uL (ref 4.0–10.5)
nRBC: 0 % (ref 0.0–0.2)

## 2023-03-01 LAB — GLUCOSE, CAPILLARY: Glucose-Capillary: 334 mg/dL — ABNORMAL HIGH (ref 70–99)

## 2023-03-01 LAB — TROPONIN I (HIGH SENSITIVITY): Troponin I (High Sensitivity): 15 ng/L

## 2023-03-01 LAB — MAGNESIUM: Magnesium: 1.8 mg/dL (ref 1.7–2.4)

## 2023-03-01 LAB — TSH: TSH: 2.643 u[IU]/mL (ref 0.350–4.500)

## 2023-03-01 LAB — BRAIN NATRIURETIC PEPTIDE: B Natriuretic Peptide: 91.7 pg/mL (ref 0.0–100.0)

## 2023-03-01 MED ORDER — INSULIN ASPART 100 UNIT/ML IJ SOLN
0.0000 [IU] | Freq: Every day | INTRAMUSCULAR | Status: DC
  Administered 2023-03-01: 4 [IU] via SUBCUTANEOUS

## 2023-03-01 MED ORDER — INSULIN GLARGINE-YFGN 100 UNIT/ML ~~LOC~~ SOLN
5.0000 [IU] | Freq: Every day | SUBCUTANEOUS | Status: DC
  Administered 2023-03-02: 5 [IU] via SUBCUTANEOUS
  Filled 2023-03-01: qty 0.05

## 2023-03-01 MED ORDER — OYSTER SHELL CALCIUM/D3 500-5 MG-MCG PO TABS
1.0000 | ORAL_TABLET | Freq: Every day | ORAL | Status: DC
  Administered 2023-03-01: 1 via ORAL
  Filled 2023-03-01: qty 1

## 2023-03-01 MED ORDER — AMIODARONE HCL 200 MG PO TABS
200.0000 mg | ORAL_TABLET | Freq: Every day | ORAL | Status: DC
  Administered 2023-03-01 – 2023-03-02 (×2): 200 mg via ORAL
  Filled 2023-03-01 (×2): qty 1

## 2023-03-01 MED ORDER — METOPROLOL SUCCINATE ER 25 MG PO TB24
25.0000 mg | ORAL_TABLET | Freq: Every day | ORAL | Status: DC
  Administered 2023-03-02: 25 mg via ORAL
  Filled 2023-03-01: qty 1

## 2023-03-01 MED ORDER — POTASSIUM CHLORIDE 20 MEQ PO PACK
20.0000 meq | PACK | Freq: Every day | ORAL | Status: DC
  Administered 2023-03-01 – 2023-03-02 (×2): 20 meq via ORAL
  Filled 2023-03-01 (×2): qty 1

## 2023-03-01 MED ORDER — FUROSEMIDE 10 MG/ML IJ SOLN
40.0000 mg | Freq: Once | INTRAMUSCULAR | Status: AC
  Administered 2023-03-01: 40 mg via INTRAVENOUS
  Filled 2023-03-01: qty 4

## 2023-03-01 MED ORDER — APIXABAN 2.5 MG PO TABS
2.5000 mg | ORAL_TABLET | Freq: Two times a day (BID) | ORAL | Status: DC
  Administered 2023-03-01 – 2023-03-02 (×2): 2.5 mg via ORAL
  Filled 2023-03-01 (×2): qty 1

## 2023-03-01 MED ORDER — ISOSORB DINITRATE-HYDRALAZINE 20-37.5 MG PO TABS
1.0000 | ORAL_TABLET | Freq: Three times a day (TID) | ORAL | Status: DC
  Administered 2023-03-01 – 2023-03-02 (×2): 1 via ORAL
  Filled 2023-03-01 (×2): qty 1

## 2023-03-01 MED ORDER — ASPIRIN 81 MG PO TBEC
81.0000 mg | DELAYED_RELEASE_TABLET | Freq: Every day | ORAL | Status: DC
  Administered 2023-03-02: 81 mg via ORAL
  Filled 2023-03-01: qty 1

## 2023-03-01 MED ORDER — ATORVASTATIN CALCIUM 40 MG PO TABS
80.0000 mg | ORAL_TABLET | Freq: Every day | ORAL | Status: DC
  Administered 2023-03-01: 80 mg via ORAL
  Filled 2023-03-01: qty 2

## 2023-03-01 MED ORDER — UMECLIDINIUM-VILANTEROL 62.5-25 MCG/ACT IN AEPB
1.0000 | INHALATION_SPRAY | Freq: Every day | RESPIRATORY_TRACT | Status: DC
  Administered 2023-03-02: 1 via RESPIRATORY_TRACT
  Filled 2023-03-01: qty 14

## 2023-03-01 MED ORDER — ALBUTEROL SULFATE (2.5 MG/3ML) 0.083% IN NEBU: 3.0000 mL | INHALATION_SOLUTION | Freq: Four times a day (QID) | RESPIRATORY_TRACT | Status: DC | PRN

## 2023-03-01 MED ORDER — INSULIN ASPART 100 UNIT/ML IJ SOLN
0.0000 [IU] | Freq: Three times a day (TID) | INTRAMUSCULAR | Status: DC
  Administered 2023-03-02: 2 [IU] via SUBCUTANEOUS

## 2023-03-01 MED ORDER — DAPAGLIFLOZIN PROPANEDIOL 10 MG PO TABS
10.0000 mg | ORAL_TABLET | Freq: Every day | ORAL | Status: DC
  Administered 2023-03-02: 10 mg via ORAL
  Filled 2023-03-01: qty 1

## 2023-03-01 MED ORDER — TORSEMIDE 20 MG PO TABS: 40.0000 mg | ORAL_TABLET | Freq: Two times a day (BID) | ORAL | Status: DC

## 2023-03-01 NOTE — ED Provider Notes (Signed)
[place] EMERGENCY DEPARTMENT AT [facility] Provider Note   CSN: [id number] Arrival date & time: 03/01/23  1611     History  Chief Complaint  Patient presents with   Shortness of Breath    [name] is a 72 y.o. female.  Pt is a 72 yo female with pmhx significant for COPD on 2L prn, CHF, obesity, hld, DM, HTN, NICM s/p AICD insertion and depression.  Pt has been sob for the last few days.  She has had increased swelling to both legs as well.  She denies any current cp, but has had cp intermittently for the past month.  EMS gave her 2 nebs en route and that did help.       Home Medications Prior to Admission medications   Medication Sig Start Date End Date Taking? Authorizing Provider  Accu-Chek Softclix Lancets lancets Check BG as directed. 11/03/22   Mapp, Gaylyn Cheers, MD  albuterol (VENTOLIN HFA) 108 (90 Base) MCG/ACT inhaler Inhale 2 puffs into the lungs every 6 (six) hours as needed for wheezing or shortness of breath. 04/28/21   Dolan Amen, MD  amiodarone (PACERONE) 200 MG tablet Take 1 tablet (200 mg total) by mouth daily. 12/09/22 09/05/23  Doran Stabler, DO  ANORO ELLIPTA 62.5-25 MCG/ACT AEPB INHALE 1 PUFF INTO THE LUNGS DAILY Patient taking differently: Inhale 1 puff into the lungs in the morning. 07/20/22   Mapp, Gaylyn Cheers, MD  apixaban (ELIQUIS) 2.5 MG TABS tablet Take 1 tablet (2.5 mg total) by mouth 2 (two) times daily. 12/09/22 03/09/23  Doran Stabler, DO  aspirin EC 81 MG tablet Take 81 mg by mouth daily. Swallow whole.    [provider]  atorvastatin (LIPITOR) 80 MG tablet Take 80 mg by mouth at bedtime.    [provider]  calcium-vitamin D (OSCAL WITH D) 500-200 MG-UNIT tablet Take 1 tablet by mouth 2 (two) times daily. Patient taking differently: Take 1 tablet by mouth at bedtime. 02/12/20   Lanelle Bal, MD  dapagliflozin propanediol (FARXIGA) 10 MG TABS tablet Take 1 tablet (10 mg total) by mouth daily. 08/18/22   Merrilyn Puma, MD   Dupilumab (DUPIXENT) 300 MG/2ML SOPN Inject 300 mg into the skin every 14 (fourteen) days.    [provider]  insulin degludec (TRESIBA) 100 UNIT/ML FlexTouch Pen Inject 5 Units into the skin daily. 12/09/22 03/09/23  Doran Stabler, DO  Insulin Pen Needle (PEN NEEDLES) 32G X 4 MM MISC 1 Needle by Does not apply route daily. 12/09/22   Doran Stabler, DO  isosorbide-hydrALAZINE (BIDIL) 20-37.5 MG tablet Take 1 tablet by mouth 3 (three) times daily. 08/11/22   Clegg, Amy D, NP  Lancets Misc. (ACCU-CHEK SOFTCLIX LANCET DEV) KIT Check BG as directed. 11/03/22   Mapp, Gaylyn Cheers, MD  metoprolol succinate (TOPROL-XL) 25 MG 24 hr tablet Take 1 tablet (25 mg total) by mouth daily. 12/09/22 03/09/23  Doran Stabler, DO  OVER THE COUNTER MEDICATION Apply 1 Application topically in the morning and at bedtime. OTC Cream for Eczema    [provider]  potassium chloride SA (KLOR-CON M) 20 MEQ tablet Take 1 tablet (20 mEq total) by mouth daily. 07/15/22   [place], Anderson Malta, FNP  Semaglutide,0.25 or 0.5MG /DOS, (OZEMPIC, 0.25 OR 0.5 MG/DOSE,) 2 MG/1.5ML SOPN Inject 0.5 mg into the skin once a week. Patient taking differently: Inject 0.5 mg into the skin every Saturday. 09/07/22   Atway, Rayann N, DO  torsemide (DEMADEX) 20 MG tablet Take 40 mg by mouth  2 (two) times daily.    [provider]      Allergies    Actos [pioglitazone], Naproxen, Rosiglitazone, Tomato, Hydrocodone-acetaminophen, and Hydrocortisone    Review of Systems   Review of Systems  Respiratory:  Positive for shortness of breath.   All other systems reviewed and are negative.   Physical Exam Updated Vital Signs BP (!) 142/79   Pulse (!) 102   Temp 98 F (36.7 C) (Oral)   Resp 20   Ht 5\' 1"  (1.549 m)   Wt 78.5 kg   SpO2 100%   BMI 32.69 kg/m  Physical Exam Vitals and nursing note reviewed.  Constitutional:      Appearance: She is well-developed. She is obese.  HENT:     Head: Normocephalic and atraumatic.      Mouth/Throat:     Mouth: Mucous membranes are moist.     Pharynx: Oropharynx is clear.  Eyes:     Extraocular Movements: Extraocular movements intact.     Pupils: Pupils are equal, round, and reactive to light.  Cardiovascular:     Rate and Rhythm: Normal rate and regular rhythm.  Pulmonary:     Effort: Tachypnea present.     Breath sounds: Rhonchi present.  Abdominal:     General: Bowel sounds are normal.     Palpations: Abdomen is soft.  Musculoskeletal:     Cervical back: Normal range of motion and neck supple.     Right lower leg: Edema present.     Left lower leg: Edema present.  Skin:    General: Skin is warm.     Capillary Refill: Capillary refill takes less than 2 seconds.  Neurological:     General: No focal deficit present.     Mental Status: She is alert and oriented to person, place, and time.  Psychiatric:        Mood and Affect: Mood normal.        Behavior: Behavior normal.     ED Results / Procedures / Treatments   Labs (all labs ordered are listed, but only abnormal results are displayed) Labs Reviewed  BASIC METABOLIC PANEL - Abnormal; Notable for the following components:      Result Value   Glucose, Bld 247 (*)    BUN 34 (*)    Creatinine, Ser 2.11 (*)    GFR, Estimated 25 (*)    Anion gap 16 (*)    All other components within normal limits  CBC WITH DIFFERENTIAL/PLATELET - Abnormal; Notable for the following components:   RDW 15.7 (*)    Eosinophils Absolute 1.0 (*)    All other components within normal limits  BRAIN NATRIURETIC PEPTIDE  TSH  MAGNESIUM  TROPONIN I (HIGH SENSITIVITY)  TROPONIN I (HIGH SENSITIVITY)    EKG EKG Interpretation  Date/Time:  Wednesday Mar 01 2023 16:25:27 EDT Ventricular Rate:  87 PR Interval:  208 QRS Duration: 111 QT Interval:  439 QTC Calculation: 529 R Axis:   184 Text Interpretation: Sinus rhythm Consider left atrial enlargement Right axis deviation Low voltage, precordial leads Abnormal lateral Q  waves Anteroseptal infarct, old Prolonged QT interval No significant change since last tracing Confirmed by Jacalyn Lefevre [phone]) on 03/01/2023 5:24:04 PM  Radiology DG Chest Port 1 View  Result Date: 03/01/2023 CLINICAL DATA:  Shortness of breath EXAM: PORTABLE CHEST 1 VIEW COMPARISON:  07/25/2022 x-ray and CT angiogram FINDINGS: Enlarged cardiopericardial silhouette. Vascular congestion. No pneumothorax, effusion or edema. Left upper chest defibrillator. Overlapping cardiac leads.  IMPRESSION: Enlarged heart with vascular congestion.  Defibrillator. Electronically Signed   By: Karen Kays M.D.   On: 03/01/2023 16:54    Procedures Procedures    Medications Ordered in ED Medications  furosemide (LASIX) injection 40 mg (40 mg Intravenous Given 03/01/23 1802)    ED Course/ Medical Decision Making/ A&P                             Medical Decision Making Amount and/or Complexity of Data Reviewed Labs: ordered. Radiology: ordered.  Risk Prescription drug management. Decision regarding hospitalization.   This patient presents to the ED for concern of sob, this involves an extensive number of treatment options, and is a complaint that carries with it a high risk of complications and morbidity.  The differential diagnosis includes chf exac, copd exac, pna   Co morbidities that complicate the patient evaluation  COPD on 2L prn, CHF, obesity, hld, DM, HTN, NICM s/p AICD insertion and depression   Additional history obtained:  Additional history obtained from epic chart review External records from outside source obtained and reviewed including EMS report   Lab Tests:  I Ordered, and personally interpreted labs.  The pertinent results include:  cbc nl, tsh nl, mg nl, bnp 91.7, trop nl, bmp nl other than bs 247 and bun 34/cr 2.11 (chronic)   Imaging Studies ordered:  I ordered imaging studies including cxr  I independently visualized and interpreted imaging which showed  Enlarged heart with vascular congestion.  Defibrillator.  I agree with the radiologist interpretation   Cardiac Monitoring:  The patient was maintained on a cardiac monitor.  I personally viewed and interpreted the cardiac monitored which showed an underlying rhythm of: nsr   Medicines ordered and prescription drug management:  I ordered medication including lasix  for chf exac  Reevaluation of the patient after these medicines showed that the patient improved I have reviewed the patients home medicines and have made adjustments as needed   Test Considered:  cxr   Critical Interventions:  lasix   Consultations Obtained:  I requested consultation with the IMTS,  and discussed lab and imaging findings as well as pertinent plan - they will admit   Problem List / ED Course:  CHF exacerbation:  pt given IV lasix.  I think she needs at least an overnight stay to diurese properly.    Reevaluation:  After the interventions noted above, I reevaluated the patient and found that they have :improved   Social Determinants of Health:  Lives at home   Dispostion:  After consideration of the diagnostic results and the patients response to treatment, I feel that the patent would benefit from admission.          Final Clinical Impression(s) / ED Diagnoses Final diagnoses:  Acute on chronic congestive heart failure, unspecified heart failure type [facility])    Rx / DC Orders ED Discharge Orders     None         Jacalyn Lefevre, MD 03/01/23 1934

## 2023-03-01 NOTE — ED Triage Notes (Addendum)
Shortness of breath x 11 days. Hx of COPD and CHF. On 2LPM [place] Prn at home. Pt reports worsening swelling to ankles. Alert and oriented x 4. EMS reports 97% on 2LPM [place] at home. EMS gave 2 breathing tx en route. Pacemaker/defib in place. Pt reports intermittent chest pain.

## 2023-03-01 NOTE — ED Notes (Signed)
Dinner tray ordered at this time.

## 2023-03-01 NOTE — ED Notes (Signed)
Pt meal tray has arrived and been given to pt.

## 2023-03-01 NOTE — ED Notes (Signed)
ED TO INPATIENT HANDOFF REPORT  ED Nurse Name and Phone #: [phone]  S Name/Age/Gender [name] 72 y.o. female Room/Bed: 031C/031C  Code Status   Code Status: Full Code  Home/SNF/Other Home Patient oriented to: self, place, time, and situation Is this baseline? Yes   Triage Complete: Triage complete  Chief Complaint Shortness of breath [R06.02]  Triage Note Shortness of breath x 11 days. Hx of COPD and CHF. On 2LPM [place] [facility] at home. Pt reports worsening swelling to ankles. Alert and oriented x 4. EMS reports 97% on 2LPM [place] at home. EMS gave 2 breathing tx en route. Pacemaker/defib in place. Pt reports intermittent chest pain.   Allergies Allergies  Allergen Reactions   Actos [Pioglitazone] Other (See Comments)    congestive heart failure    Naproxen Other (See Comments)    500mg  dose made her sleep for two days   Rosiglitazone Hives   Tomato Itching   Hydrocodone-Acetaminophen Itching   Hydrocortisone Hives and Itching    Level of Care/Admitting Diagnosis ED Disposition     ED Disposition  Admit   Condition  --   Comment  Hospital Area: [facility] [[id number]]  Level of Care: Telemetry Medical [104]  May place patient in observation at [facility] or [facility] if equivalent level of care is available:: No  Covid Evaluation: Asymptomatic - no recent exposure (last 10 days) testing not required  Diagnosis: Shortness of breath [786.05.ICD-9-CM]  Admitting Physician: Mercie Eon [[id number]]  Attending Physician: Mercie Eon [[id number]]          B Medical/Surgery History Past Medical History:  Diagnosis Date   Acute metabolic encephalopathy 07/26/2022   ATN (acute tubular necrosis) (HCC) 07/15/2014   Automatic implantable cardioverter-defibrillator in situ    Automatic implantable cardioverter-defibrillator in situ 05/04/2007   Qualifier: Diagnosis of  By: Levon Hedger     Cardiac defibrillator in situ    Atlas II VR (SJM) implanted  by Dr Ladona Ridgel   CHF (congestive heart failure) (HCC)    Chronic combined systolic and diastolic heart failure (HCC)    a. EF 35-40% in past;  b. Echo 7/13:  EF 45-50%, Gr 2 diast dysfn, mild AI, mild MAC, trivial MR, mild LAE, PASP 47.   Chronic ulcer of leg (HCC)    04-09-15 resolved-not a problem.   Colon polyps 04/12/2013   Rectosigmoid polyp   COPD (chronic obstructive pulmonary disease) (HCC)    O2 at night   Depression    Diabetes mellitus    Diabetes mellitus, type 2 (HCC) 04/03/2012   HIGH RISK FEET.. Please have patient take shoes and socks off every visit for visual foot inspection.     Eczema    Elevated alkaline phosphatase level    GGT and 5'nucleotidase 8/13 normal   Health care maintenance 01/22/2013   Surgically absent cervix- no pap needed (Path report 07/2000: uterine body with attached bilateral  adnexa and separate cervix.)   History of oxygen administration    oxygen @ 2 l/m nasally bedtime 24/7   HTN (hypertension)    Hx of cardiac cath    a. LHC 2003 normal;  b. LHC 6/13:  Mild calcification in the LM, o/w normal coronary arteries, EF 45%.    Hyperkalemia 08/08/2017   Hyperlipidemia    Elevated triglyceride in 2019   Hyperlipidemia associated with type 2 diabetes mellitus (HCC) 05/25/2007   Qualifier: Diagnosis of  By: Daphine Deutscher FNP, [place]     Hyperthyroidism, subclinical  HYPERTHYROIDISM, SUBCLINICAL 05/06/2009   Qualifier: Diagnosis of  By: Flonnie Overman     Implantable cardioverter-defibrillator (ICD) generator end of life    Lipoma    Need for hepatitis C screening test 04/25/2019   NICM (nonischemic cardiomyopathy) (HCC)    Obesity    On home oxygen therapy    "2L; 24/7" (10/11/2016)   Post menopausal syndrome    Shortness of breath 07/14/2017   Skin rash 07/12/2018   Sleep apnea    pt denies 04/12/2013   Past Surgical History:  Procedure Laterality Date   ABDOMINAL HYSTERECTOMY     CARDIAC CATHETERIZATION     CARDIAC CATHETERIZATION N/A  06/21/2016   Procedure: Left Heart Cath and Coronary Angiography;  Surgeon: Tonny Bollman, MD;  Location: [facility] INVASIVE CV LAB;  Service: Cardiovascular;  Laterality: N/A;   CARDIAC DEFIBRILLATOR PLACEMENT  05/04/2007   SJM Atlas II VR ICD   CARDIAC DEFIBRILLATOR PLACEMENT     CATARACT EXTRACTION     od   COLONOSCOPY N/A 04/12/2013   Procedure: COLONOSCOPY;  Surgeon: Theda Belfast, MD;  Location: [facility] ENDOSCOPY;  Service: Endoscopy;  Laterality: N/A;  pt.has defibrilator   COLONOSCOPY N/A 04/16/2015   Procedure: COLONOSCOPY;  Surgeon: Rachael Fee, MD;  Location: [facility] ENDOSCOPY;  Service: Endoscopy;  Laterality: N/A;   ESOPHAGOGASTRODUODENOSCOPY N/A 04/16/2015   Procedure: ESOPHAGOGASTRODUODENOSCOPY (EGD);  Surgeon: Rachael Fee, MD;  Location: [facility] ENDOSCOPY;  Service: Endoscopy;  Laterality: N/A;   ESOPHAGOGASTRODUODENOSCOPY (EGD) WITH PROPOFOL N/A 11/25/2022   Procedure: ESOPHAGOGASTRODUODENOSCOPY (EGD) WITH PROPOFOL;  Surgeon: Jeani Hawking, MD;  Location: [facility] ENDOSCOPY;  Service: Gastroenterology;  Laterality: N/A;   EYE SURGERY     HERNIA REPAIR     HYSTEROSCOPY     IMPLANTABLE CARDIOVERTER DEFIBRILLATOR (ICD) GENERATOR CHANGE N/A 04/02/2014   Procedure: ICD GENERATOR CHANGE;  Surgeon: Marinus Maw, MD;  Location: [facility] CATH LAB;  Service: Cardiovascular;  Laterality: N/A;   INSERT / REPLACE / REMOVE PACEMAKER     LEFT HEART CATHETERIZATION WITH CORONARY ANGIOGRAM N/A 04/02/2012   Procedure: LEFT HEART CATHETERIZATION WITH CORONARY ANGIOGRAM;  Surgeon: Herby Abraham, MD;  Location: [facility] CATH LAB;  Service: Cardiovascular;  Laterality: N/A;   ORIF ANKLE FRACTURE Right 08/12/2019   Procedure: OPEN REDUCTION INTERNAL FIXATION (ORIF) RIGHT TRIMALLEOLAR ANKLE FRACTURE;  Surgeon: Tarry Kos, MD;  Location: [facility] OR;  Service: Orthopedics;  Laterality: Right;   RIGHT HEART CATH N/A 08/03/2022   Procedure: RIGHT HEART CATH;  Surgeon: Dolores Patty, MD;  Location: [facility] INVASIVE CV LAB;  Service:  Cardiovascular;  Laterality: N/A;   SAVORY DILATION N/A 11/25/2022   Procedure: SAVORY DILATION;  Surgeon: Jeani Hawking, MD;  Location: [facility] ENDOSCOPY;  Service: Gastroenterology;  Laterality: N/A;   TEE WITHOUT CARDIOVERSION N/A 06/23/2022   Procedure: TRANSESOPHAGEAL ECHOCARDIOGRAM (TEE);  Surgeon: Parke Poisson, MD;  Location: [facility] ENDOSCOPY;  Service: Cardiology;  Laterality: N/A;   TONSILLECTOMY     TUBAL LIGATION       A IV Location/Drains/Wounds Patient Lines/Drains/Airways Status     Active Line/Drains/Airways     Name Placement date Placement time Site Days   Peripheral IV 03/01/23 20 G Right Antecubital 03/01/23  1646  Antecubital  less than 1   Airway 11/25/22  0913  -- 96            Intake/Output Last 24 hours No intake or output data in the 24 hours ending 03/01/23 2053  Labs/Imaging Results for orders placed or performed during the  hospital encounter of 03/01/23 (from the past 48 hour(s))  Basic metabolic panel     Status: Abnormal   Collection Time: 03/01/23  5:15 PM  Result Value Ref Range   Sodium 142 135 - 145 mmol/L   Potassium 3.5 3.5 - 5.1 mmol/L   Chloride 98 98 - 111 mmol/L   CO2 28 22 - 32 mmol/L   Glucose, Bld 247 (H) 70 - 99 mg/dL    Comment: Glucose reference range applies only to samples taken after fasting for at least 8 hours.   BUN 34 (H) 8 - 23 mg/dL   Creatinine, Ser 1.61 (H) 0.44 - 1.00 mg/dL   Calcium 9.4 8.9 - 09.6 mg/dL   GFR, Estimated 25 (L) >60 mL/min    Comment: (NOTE) Calculated using the CKD-EPI Creatinine Equation (2021)    Anion gap 16 (H) 5 - 15    Comment: Performed at [facility], [address], [place], [place] [address]  CBC with Differential     Status: Abnormal   Collection Time: 03/01/23  5:15 PM  Result Value Ref Range   WBC 7.4 4.0 - 10.5 K/uL   RBC 4.92 3.87 - 5.11 MIL/uL   Hemoglobin 13.4 12.0 - 15.0 g/dL   HCT 98.1 19.1 - 47.8 %   MCV 90.4 80.0 - 100.0 fL   MCH 27.2 26.0 - 34.0 pg   MCHC 30.1  30.0 - 36.0 g/dL   RDW 29.5 (H) 62.1 - 30.8 %   Platelets 218 150 - 400 K/uL   nRBC 0.0 0.0 - 0.2 %   Neutrophils Relative % 52 %   Neutro Abs 3.8 1.7 - 7.7 K/uL   Lymphocytes Relative 27 %   Lymphs Abs 2.0 0.7 - 4.0 K/uL   Monocytes Relative 7 %   Monocytes Absolute 0.5 0.1 - 1.0 K/uL   Eosinophils Relative 13 %   Eosinophils Absolute 1.0 (H) 0.0 - 0.5 K/uL   Basophils Relative 1 %   Basophils Absolute 0.1 0.0 - 0.1 K/uL   Immature Granulocytes 0 %   Abs Immature Granulocytes 0.02 0.00 - 0.07 K/uL    Comment: Performed at [facility], [address] [place], [place] [address]  Troponin I (High Sensitivity)     Status: None   Collection Time: 03/01/23  5:15 PM  Result Value Ref Range   Troponin I (High Sensitivity) 15 <18 ng/L    Comment: (NOTE) Elevated high sensitivity troponin I (hsTnI) values and significant  changes across serial measurements may suggest ACS but many other  chronic and acute conditions are known to elevate hsTnI results.  Refer to the "Links" section for chest pain algorithms and additional  guidance. Performed at [facility], [address], [place], [place] [address]   Brain natriuretic peptide     Status: None   Collection Time: 03/01/23  5:16 PM  Result Value Ref Range   B Natriuretic Peptide 91.7 0.0 - 100.0 pg/mL    Comment: Performed at [facility], [address] [place], [place] [address]  TSH     Status: None   Collection Time: 03/01/23  5:17 PM  Result Value Ref Range   TSH 2.643 0.350 - 4.500 uIU/mL    Comment: Performed by a 3rd Generation assay with a functional sensitivity of <=0.01 uIU/mL. Performed at [facility], [address] [place], [place] [address]   Magnesium     Status: None   Collection  Time: 03/01/23  6:03 PM  Result Value Ref Range   Magnesium 1.8 1.7 - 2.4 mg/dL    Comment: Performed at [facility], [address], [place], [place] [address]   *Note: Due to a large number of  results and/or encounters for the requested time period, some results have not been displayed. A complete set of results can be found in Results Review.   DG Chest Port 1 View  Result Date: 03/01/2023 CLINICAL DATA:  Shortness of breath EXAM: PORTABLE CHEST 1 VIEW COMPARISON:  07/25/2022 x-ray and CT angiogram FINDINGS: Enlarged cardiopericardial silhouette. Vascular congestion. No pneumothorax, effusion or edema. Left upper chest defibrillator. Overlapping cardiac leads. IMPRESSION: Enlarged heart with vascular congestion.  Defibrillator. Electronically Signed   By: Karen Kays M.D.   On: 03/01/2023 16:54    Pending Labs Unresulted Labs (From admission, onward)     Start     Ordered   03/02/23 0500  Basic metabolic panel  Tomorrow morning,   R        03/01/23 2025   03/02/23 0500  CBC  Tomorrow morning,   R        03/01/23 2025            Vitals/Pain Today's Vitals   03/01/23 1630 03/01/23 1800 03/01/23 1900 03/01/23 1902  BP: (!) 183/74 (!) 143/53 (!) 142/79   Pulse: 86 77 (!) 102   Resp: (!) 26 (!) 22 20   Temp:    98 F (36.7 C)  TempSrc:    Oral  SpO2: 100% 99% 100%   Weight:      Height:      PainSc:        Isolation Precautions No active isolations  Medications Medications  dapagliflozin propanediol (FARXIGA) tablet 10 mg (has no administration in time range)  insulin degludec (TRESIBA) 100 UNIT/ML FlexTouch Pen 5 Units (has no administration in time range)  isosorbide-hydrALAZINE (BIDIL) 20-37.5 MG per tablet 1 tablet (has no administration in time range)  metoprolol succinate (TOPROL-XL) 24 hr tablet 25 mg (has no administration in time range)  potassium chloride (KLOR-CON) packet 20 mEq (has no administration in time range)  torsemide (DEMADEX) tablet 40 mg (has no administration in time range)  albuterol (VENTOLIN HFA) 108 (90 Base) MCG/ACT inhaler 1 puff (has no administration in time range)  amiodarone (PACERONE) tablet 200 mg (has no administration in  time range)  umeclidinium-vilanterol (ANORO ELLIPTA) 62.5-25 MCG/ACT 1 puff (has no administration in time range)  apixaban (ELIQUIS) tablet 2.5 mg (has no administration in time range)  aspirin EC tablet 81 mg (has no administration in time range)  atorvastatin (LIPITOR) tablet 80 mg (has no administration in time range)  calcium-vitamin D (OSCAL WITH D) 500-5 MG-MCG per tablet 1 tablet (has no administration in time range)  furosemide (LASIX) injection 40 mg (40 mg Intravenous Given 03/01/23 1802)    Mobility walks     Focused Assessments Cardiac Assessment Handoff:    Lab Results  Component Value Date   CKTOTAL 252 (H) 06/18/2022   CKMB 4.2 (H) 04/02/2012   TROPONINI 0.05 (HH) 07/15/2017   Lab Results  Component Value Date   DDIMER 0.66 (H) 04/13/2018   Does the Patient currently have chest pain? No    R Recommendations: See Admitting Provider Note  Report given to:   Additional Notes:

## 2023-03-01 NOTE — H&P (Cosign Needed Addendum)
Date: 03/01/2023               Patient Name:  [name] MRN: [id number]  DOB: 1951/07/21 Age / Sex: 72 y.o., female   PCP: Karoline Caldwell, MD         Medical Service: Internal Medicine Teaching Service         Attending Physician: Dr. Mercie Eon, MD    First Contact: Lajuana Ripple, MD Pager: [phone]  Second Contact: Rudene Christians, DO Pager: Milinda Pointer [phone]       After Hours (After 5p/  First Contact Pager: [phone]  weekends / holidays): Second Contact Pager: [phone]   SUBJECTIVE   Chief Complaint: shortness of breath  History of Present Illness: 72 yo female with T2DM, HTN, HLD, HFrEF s/p AICD, PAF, CKD IV, COPD, depression who presented to the ED with shortness of breath and leg swelling.  Patient reports that she has had worsening shortness of breath since Mother's Day which is about 2 weeks ago.  Also endorses some intermittent chest pain and continued chronic cough that is unchanged.  Feels like she has had fluid on her lungs.  She does not take her albuterol for shortness of breath simply because she has not thought of it.  She does report waking up at night struggling to breathe.  This has been worse the past couple weeks.  Sleeps on 3 pillows but has not needed any more during this time.  She has been weighing herself at home and was 168 pounds last week, reporting that her weight is decreasing rather than increasing.  She is on 2 L of oxygen at night and sometimes during the day but this has not necessarily been more frequent as of late.  Her chest pain started this week and is worse with coughing.  She does have a little pain in the center of her chest that is not worse with exertion but does improve with rest.  Does not radiate anywhere.  Also reports leg swelling and is unsure when this started.  She has been taking torsemide 80 mg twice daily.  States she does sometimes miss her medications but this is rare.  Denies headache or vision changes.  Endorses blurred vision that corrects  with glasses.   She did not take her medications this morning. Feeling better now.  ED Course: Received breathing treatments with EMS. On arrival to ED, Afebrile, mildly hypertensive. WBC 7.4. BNP 91.7. Trop 15. Electrolytes wnl. BUN 34, Cr 2.11. CXR with cardiomegaly and vascular congestion. Received IV lasix 40 mg x 1. On baseline O2 of 2L.  MEDS Albuterol inhale 2 puffs q6h PRN wheezing or shortness of breath Amiodarone 200 mg daily Anoro ellipta 62.5-25 mcg 1 puff daliy Apixaban 2.5 mg BID Asa 81 mg daily Atorvastatin 80 mg daily Calcium-vitamin D 500-200 daily at bedtime Dapagliflozin propanediol 10 mg daily Tresiba 5 units daily Isosorbide-hydralazine 20-37.5 mg TID Metoprolol succinate 25 mg daily Potassium chloride SA 20 mEq daily Semaglutide 0.5 mg once weekly Torsemide 80 mg BID  SOCHX Lives alone. PCP is through [facility]. Is retired. Was smoking 1/2 PPD for most of her life until yesterday; she says she gave away her cigarettes yesterday. Does drink alcohol occasionally, moreso on the holidays. No illicit substance use. Independent in ADLs, IADLs. Does sometimes use a walker and wheelchair when needed.  FAMHX Mother: Multiple CVA Father: Seizure disorder Daughter: Breast cancer, had double mastectomy  Past Medical History:  Diagnosis Date   Acute metabolic encephalopathy 07/26/2022  ATN (acute tubular necrosis) (HCC) 07/15/2014   Automatic implantable cardioverter-defibrillator in situ    Automatic implantable cardioverter-defibrillator in situ 05/04/2007   Qualifier: Diagnosis of  By: Levon Hedger     Cardiac defibrillator in situ    Atlas II VR (SJM) implanted by Dr Ladona Ridgel   CHF (congestive heart failure) (HCC)    Chronic combined systolic and diastolic heart failure (HCC)    a. EF 35-40% in past;  b. Echo 7/13:  EF 45-50%, Gr 2 diast dysfn, mild AI, mild MAC, trivial MR, mild LAE, PASP 47.   Chronic ulcer of leg (HCC)    04-09-15 resolved-not a problem.   Colon  polyps 04/12/2013   Rectosigmoid polyp   COPD (chronic obstructive pulmonary disease) (HCC)    O2 at night   Depression    Diabetes mellitus    Diabetes mellitus, type 2 (HCC) 04/03/2012   HIGH RISK FEET.. Please have patient take shoes and socks off every visit for visual foot inspection.     Eczema    Elevated alkaline phosphatase level    GGT and 5'nucleotidase 8/13 normal   Health care maintenance 01/22/2013   Surgically absent cervix- no pap needed (Path report 07/2000: uterine body with attached bilateral  adnexa and separate cervix.)   History of oxygen administration    oxygen @ 2 l/m nasally bedtime 24/7   HTN (hypertension)    Hx of cardiac cath    a. LHC 2003 normal;  b. LHC 6/13:  Mild calcification in the LM, o/w normal coronary arteries, EF 45%.    Hyperkalemia 08/08/2017   Hyperlipidemia    Elevated triglyceride in 2019   Hyperlipidemia associated with type 2 diabetes mellitus (HCC) 05/25/2007   Qualifier: Diagnosis of  By: Daphine Deutscher FNP, [place]     Hyperthyroidism, subclinical    HYPERTHYROIDISM, SUBCLINICAL 05/06/2009   Qualifier: Diagnosis of  By: Flonnie Overman     Implantable cardioverter-defibrillator (ICD) generator end of life    Lipoma    Need for hepatitis C screening test 04/25/2019   NICM (nonischemic cardiomyopathy) (HCC)    Obesity    On home oxygen therapy    "2L; 24/7" (10/11/2016)   Post menopausal syndrome    Shortness of breath 07/14/2017   Skin rash 07/12/2018   Sleep apnea    pt denies 04/12/2013    Past Surgical History:  Procedure Laterality Date   ABDOMINAL HYSTERECTOMY     CARDIAC CATHETERIZATION     CARDIAC CATHETERIZATION N/A 06/21/2016   Procedure: Left Heart Cath and Coronary Angiography;  Surgeon: Tonny Bollman, MD;  Location: [facility] INVASIVE CV LAB;  Service: Cardiovascular;  Laterality: N/A;   CARDIAC DEFIBRILLATOR PLACEMENT  05/04/2007   SJM Atlas II VR ICD   CARDIAC DEFIBRILLATOR PLACEMENT     CATARACT EXTRACTION     od    COLONOSCOPY N/A 04/12/2013   Procedure: COLONOSCOPY;  Surgeon: Theda Belfast, MD;  Location: [facility] ENDOSCOPY;  Service: Endoscopy;  Laterality: N/A;  pt.has defibrilator   COLONOSCOPY N/A 04/16/2015   Procedure: COLONOSCOPY;  Surgeon: Rachael Fee, MD;  Location: [facility] ENDOSCOPY;  Service: Endoscopy;  Laterality: N/A;   ESOPHAGOGASTRODUODENOSCOPY N/A 04/16/2015   Procedure: ESOPHAGOGASTRODUODENOSCOPY (EGD);  Surgeon: Rachael Fee, MD;  Location: [facility]COPY;  Service: Endoscopy;  Laterality: N/A;   ESOPHAGOGASTRODUODENOSCOPY (EGD) WITH PROPOFOL N/A 11/25/2022   Procedure: ESOPHAGOGASTRODUODENOSCOPY (EGD) WITH PROPOFOL;  Surgeon: Jeani Hawking, MD;  Location: [facility] ENDOSCOPY;  Service: Gastroenterology;  Laterality: N/A;   EYE SURGERY  HERNIA REPAIR     HYSTEROSCOPY     IMPLANTABLE CARDIOVERTER DEFIBRILLATOR (ICD) GENERATOR CHANGE N/A 04/02/2014   Procedure: ICD GENERATOR CHANGE;  Surgeon: Marinus Maw, MD;  Location[facility] CATH LAB;  Service: Cardiovascular;  Laterality: N/A;   INSERT / REPLACE / REMOVE PACEMAKER     LEFT HEART CATHETERIZATION WITH CORONARY ANGIOGRAM N/A 04/02/2012   Procedure: LEFT HEART CATHETERIZATION WITH CORONARY ANGIOGRAM;  Surgeon: Herby Abraham, MD;  Location: [facility] CATH LAB;  Service: Cardiovascular;  Laterality: N/A;   ORIF ANKLE FRACTURE Right 08/12/2019   Procedure: OPEN REDUCTION INTERNAL FIXATION (ORIF) RIGHT TRIMALLEOLAR ANKLE FRACTURE;  Surgeon: Tarry Kos, MD;  Location: [facility] OR;  Service: Orthopedics;  Laterality: Right;   RIGHT HEART CATH N/A 08/03/2022   Procedure: RIGHT HEART CATH;  Surgeon: Dolores Patty, MD;  Location: [facility] INVASIVE CV LAB;  Service: Cardiovascular;  Laterality: N/A;   SAVORY DILATION N/A 11/25/2022   Procedure: SAVORY DILATION;  Surgeon: Jeani Hawking, MD;  Location: [facility] ENDOSCOPY;  Service: Gastroenterology;  Laterality: N/A;   TEE WITHOUT CARDIOVERSION N/A 06/23/2022   Procedure: TRANSESOPHAGEAL ECHOCARDIOGRAM (TEE);  Surgeon: Parke Poisson, MD;  Location: [facility] ENDOSCOPY;  Service: Cardiology;  Laterality: N/A;   TONSILLECTOMY     TUBAL LIGATION     Allergies: Allergies as of 03/01/2023 - Review Complete 03/01/2023  Allergen Reaction Noted   Actos [pioglitazone] Other (See Comments) 03/12/2009   Naproxen Other (See Comments) 02/05/2016   Rosiglitazone Hives 02/11/2016   Tomato Itching 06/16/2021   Hydrocodone-acetaminophen Itching 08/08/2019   Hydrocortisone Hives and Itching 10/06/2009    Review of Systems: A complete ROS was negative except as per HPI.   OBJECTIVE:   Physical Exam: Blood pressure (!) 142/79, pulse (!) 102, temperature 98.1 F (36.7 C), temperature source Oral, resp. rate 20, height 5\' 1"  (1.549 m), weight 78.5 kg, SpO2 100 %.  Constitutional: Chronically ill-appearing elderly female sitting in hospital bed, in no acute distress HENT: normocephalic atraumatic, mucous membranes moist Eyes: conjunctiva non-erythematous Neck: supple Cardiovascular: regular rate and rhythm, no m/r/g, no JVD Pulmonary/Chest: normal work of breathing on room air, no wheezing or crackles on auscultation Abdominal: soft, non-distended MSK: normal bulk and tone, 1-2+ pitting edema to mid shin bilaterally, distal extremities warm and well-perfused Neurological: alert & oriented x 3, 5/5 strength in bilateral upper and lower extremities, normal gait Skin: warm and dry, hyperpigmented plaques on trunk and extremities Psych: Normal mood and affect  Labs: CBC    Component Value Date/Time   WBC 7.4 03/01/2023 1715   RBC 4.92 03/01/2023 1715   HGB 13.4 03/01/2023 1715   HGB 14.1 02/08/2017 1551   HCT 44.5 03/01/2023 1715   HCT 44.5 02/08/2017 1551   PLT 218 03/01/2023 1715   PLT 246 02/08/2017 1551   MCV 90.4 03/01/2023 1715   MCV 87 02/08/2017 1551   MCH 27.2 03/01/2023 1715   MCHC 30.1 03/01/2023 1715   RDW 15.7 (H) 03/01/2023 1715   RDW 16.7 (H) 02/08/2017 1551   LYMPHSABS 2.0 03/01/2023 1715    LYMPHSABS 2.6 02/08/2017 1551   MONOABS 0.5 03/01/2023 1715   EOSABS 1.0 (H) 03/01/2023 1715   EOSABS 0.3 02/08/2017 1551   BASOSABS 0.1 03/01/2023 1715   BASOSABS 0.0 02/08/2017 1551     CMP     Component Value Date/Time   NA 142 03/01/2023 1715   NA 141 02/15/2022 1142   K 3.5 03/01/2023 1715   CL 98 03/01/2023 1715  CO2 28 03/01/2023 1715   GLUCOSE 247 (H) 03/01/2023 1715   BUN 34 (H) 03/01/2023 1715   BUN 19 02/15/2022 1142   CREATININE 2.11 (H) 03/01/2023 1715   CREATININE 0.88 06/16/2016 1338   CALCIUM 9.4 03/01/2023 1715   PROT 7.8 09/06/2022 1114   PROT 6.9 02/08/2017 1551   ALBUMIN 3.2 (L) 09/06/2022 1114   ALBUMIN 4.1 02/08/2017 1551   AST 18 09/06/2022 1114   ALT 15 09/06/2022 1114   ALKPHOS 117 09/06/2022 1114   BILITOT 0.9 09/06/2022 1114   BILITOT 0.4 02/08/2017 1551   GFRNONAA 25 (L) 03/01/2023 1715   GFRNONAA 75 02/13/2015 1015   GFRAA 78 07/14/2020 1536   GFRAA 87 02/13/2015 1015    Imaging: DG Chest Port 1 View  Result Date: 03/01/2023 CLINICAL DATA:  Shortness of breath EXAM: PORTABLE CHEST 1 VIEW COMPARISON:  07/25/2022 x-ray and CT angiogram FINDINGS: Enlarged cardiopericardial silhouette. Vascular congestion. No pneumothorax, effusion or edema. Left upper chest defibrillator. Overlapping cardiac leads. IMPRESSION: Enlarged heart with vascular congestion.  Defibrillator. Electronically Signed   By: Karen Kays M.D.   On: 03/01/2023 16:54    EKG: personally reviewed my interpretation is NSR, right axis deviation, prolonged QT, no ischemic changes compared to prior.  ASSESSMENT & PLAN:   Assessment & Plan by Problem: Active Problems:   Shortness of breath   KRISTON KENDER is a 72 y.o. female with T2DM, HTN, HLD, HFrEF s/p AICD, PAF, CKD IV, COPD, depression who presented to the ED with shortness of breath admitted for shortness of breath.  #Shortness of breath #HFrEF, NYHA III Echo from Oct 2023 demonstrated EF 25-30%. S/p AICD. GDMT limited  by renal function. Presents today with shortness of breath but does not appear significantly volume overloaded on exam. She is breathing well on room air. No JVD on exam. Lungs clear to auscultation. BNP wnl.  CXR with cardiomegaly and vascular congestion.  Follows with heart failure outpatient. Last visit in Nov 2023 with instructions to take torsemide 40 mg bid, farxiga 10 mg daily, and Bidil 1 tab tid.  Patient has however been taking 80 mg twice daily.  I do not suspect heart failure exacerbation at this time given normal BNP, CXR with only vascular congestion and cardiomegaly but no signs of interstitial or alveolar edema.  She has recent uptick in dyspnea but PND and orthopnea seem stable.  Lung exam normal and satting well on room air.  No JVD.  Additionally she has been taking twice her prescribed dose of torsemide.  She was apparently volume overloaded on arrival but this has improved, possibly due to IV Lasix.  Her weight is down 3 pounds since 1 month ago. Thankfully her sCr is stable/improved.  Do not suspect COPD exacerbation either given she has no wheezing and she is at her baseline oxygen requirement.  Low suspicion for infection given lack of leukocytosis, fever, or changes in her chronic cough.  No focal consolidation on chest x-ray.  She was moderately hypertensive on arrival but is without severe hypoxemia so doubt flash pulmonary edema, especially given lack of headaches or concerning vision changes.  Do not suspect ACS given atypical clinical picture, nonischemic ECG and normal troponins.  Also have low suspicion for PE given she is not tachycardic, hypoxic, her symptoms worsen with exertion, and she seems to have responded to diuresis. -supplement O2 with goal sat 88-92% -continue torsemide 40 mg twice daily  -continue farxiga 10 mg daily -continue Bidil  1 tablet  tid -Continue potassium supplementation -telemetry  -daily weights -strict I/Os  #COPD Reports daily use of an oral  Ellipta but has not used albuterol in some time.  Denies any recent worsening of cough. Oxygenating well on room air. No wheezing on exam. Did receive breathing treatment with EMS.   -continue home albuterol 2 puffs q6 hr prn shortness of breath, wheezing -Continue Anora Ellipta 1 puff daily  #T2DM Last A1c 9.0. Currently taking Farxiga 10 mg, Ozempic 0.5 mg and Tresiba 5 u daily. -SSI -Tresiba 5 units daily -POC CBG  #PAF NSR, rate controlled this admission. No thrombus on 06/2022 TEE.  -continue amiodarone 200 mg daily  -continue eliquis 5 mg bid  #CKD IV sCr with modest improvement since last check, now 2.11. -Will start her back on torsemide 40 mg twice daily  #HTN Hypertensive this admission.  Did not take her medications morning.  Denies headaches or vision changes.  Does have some chest pain (see above) but I do not feel this is angina. -Continue BiDil 3 times daily -Continue torsemide 40 mg twice daily -Restart metoprolol 25 mg daily tomorrow  #Intrinsic atopic dermatitis Patient is followed by dermatology for this.  Continues today with pruritic plaques on her trunk and extremities.  She takes dupilumab and also has triamcinolone cream. -Continue outpatient follow-up with dermatology  #HLD -continue home lipitor 80 mg daily -Continue aspirin 81 mg daily  Diet: Carb-Modified VTE:  Eliquis IVF: None,None Code: Full  Prior to Admission Living Arrangement: Home, living alone Anticipated Discharge Location: Home Barriers to Discharge: Medical management  Dispo: Admit patient to Observation with expected length of stay less than 2 midnights.  Signed: Adron Bene, MD Internal Medicine Resident PGY-1  03/01/2023, 8:31 PM

## 2023-03-02 ENCOUNTER — Telehealth: Payer: Self-pay | Admitting: *Deleted

## 2023-03-02 DIAGNOSIS — Z794 Long term (current) use of insulin: Secondary | ICD-10-CM

## 2023-03-02 DIAGNOSIS — I5023 Acute on chronic systolic (congestive) heart failure: Secondary | ICD-10-CM

## 2023-03-02 DIAGNOSIS — E1122 Type 2 diabetes mellitus with diabetic chronic kidney disease: Secondary | ICD-10-CM

## 2023-03-02 DIAGNOSIS — J449 Chronic obstructive pulmonary disease, unspecified: Secondary | ICD-10-CM

## 2023-03-02 DIAGNOSIS — N184 Chronic kidney disease, stage 4 (severe): Secondary | ICD-10-CM

## 2023-03-02 DIAGNOSIS — F17210 Nicotine dependence, cigarettes, uncomplicated: Secondary | ICD-10-CM

## 2023-03-02 DIAGNOSIS — R0602 Shortness of breath: Secondary | ICD-10-CM | POA: Diagnosis not present

## 2023-03-02 LAB — BASIC METABOLIC PANEL
Anion gap: 10 (ref 5–15)
BUN: 34 mg/dL — ABNORMAL HIGH (ref 8–23)
CO2: 29 mmol/L (ref 22–32)
Calcium: 8.8 mg/dL — ABNORMAL LOW (ref 8.9–10.3)
Chloride: 101 mmol/L (ref 98–111)
Creatinine, Ser: 1.97 mg/dL — ABNORMAL HIGH (ref 0.44–1.00)
GFR, Estimated: 27 mL/min — ABNORMAL LOW (ref >60)
Glucose, Bld: 184 mg/dL — ABNORMAL HIGH (ref 70–99)
Potassium: 3.6 mmol/L (ref 3.5–5.1)
Sodium: 140 mmol/L (ref 135–145)

## 2023-03-02 LAB — GLUCOSE, CAPILLARY
Glucose-Capillary: 181 mg/dL — ABNORMAL HIGH (ref 70–99)
Glucose-Capillary: 103 mg/dL — ABNORMAL HIGH (ref 70–99)

## 2023-03-02 LAB — CBC
HCT: 37.4 % (ref 36.0–46.0)
Hemoglobin: 11.1 g/dL — ABNORMAL LOW (ref 12.0–15.0)
MCH: 26.1 pg (ref 26.0–34.0)
MCHC: 29.7 g/dL — ABNORMAL LOW (ref 30.0–36.0)
MCV: 87.8 fL (ref 80.0–100.0)
Platelets: 213 10*3/uL (ref 150–400)
RBC: 4.26 MIL/uL (ref 3.87–5.11)
RDW: 15.7 % — ABNORMAL HIGH (ref 11.5–15.5)
WBC: 9.4 10*3/uL (ref 4.0–10.5)
nRBC: 0 % (ref 0.0–0.2)

## 2023-03-02 LAB — TROPONIN I (HIGH SENSITIVITY): Troponin I (High Sensitivity): 21 ng/L — ABNORMAL HIGH (ref <18)

## 2023-03-02 MED ORDER — TORSEMIDE 20 MG PO TABS
80.0000 mg | ORAL_TABLET | Freq: Two times a day (BID) | ORAL | Status: DC
  Administered 2023-03-02: 80 mg via ORAL
  Filled 2023-03-02: qty 4

## 2023-03-02 MED ORDER — TORSEMIDE 40 MG PO TABS: 80.0000 mg | ORAL_TABLET | Freq: Two times a day (BID) | ORAL | 0 refills | Status: DC

## 2023-03-02 MED ORDER — TORSEMIDE 20 MG PO TABS: 80.0000 mg | ORAL_TABLET | Freq: Two times a day (BID) | ORAL | Status: DC

## 2023-03-02 NOTE — TOC Initial Note (Signed)
Transition of Care (TOC) - Initial/Assessment Note  From home, has w/chair, walker and BSC, per pt eval no pt f/u needed.  She states her friend Erlinda Hong will transport her home today. NCM offered choice for HHPT, she states she does not have a preference.  NCM made referral to Christian with [facility]. Ephriam Knuckles is able to take referral.  Soc will begin 24 to 48 hrs post dc.   Patient Details  Name: [name] MRN: [id number] Date of Birth: [birth year]  Transition of Care [facility]) CM/SW Contact:    Leone Haven, RN Phone Number: 03/02/2023, 1:48 PM  Clinical Narrative:                         Patient Goals and CMS Choice            Expected Discharge Plan and Services         Expected Discharge Date: 03/02/23                                    Prior Living Arrangements/Services                       Activities of Daily Living      Permission Sought/Granted                  Emotional Assessment              Admission diagnosis:  Shortness of breath [R06.02] Acute on chronic congestive heart failure, unspecified heart failure type [facility]) [I50.9] Patient Active Problem List   Diagnosis Date Noted   Stage 4 chronic kidney disease (HCC) 03/02/2023   Acute on chronic HFrEF (heart failure with reduced ejection fraction) (HCC) 03/02/2023   Shortness of breath 03/01/2023   Esophageal dysphagia 10/10/2022   Preventative health care 08/18/2022   CKD (chronic kidney disease) stage 4, GFR 15-29 ml/min (HCC) 07/26/2022   COPD (chronic obstructive pulmonary disease) (HCC) 07/26/2022   Obesity (BMI 30-39.9) 07/26/2022   Paroxysmal atrial flutter (HCC) 07/25/2022   Multiple thyroid nodules 07/04/2022   Recurrent falls 07/04/2022   Hepatic steatosis 07/28/2020   Hyperlipidemia    Osteoporosis 02/12/2020   Intrinsic atopic dermatitis 09/19/2019   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   Chronic systolic CHF (congestive  heart failure) (HCC) 03/18/2016   Spinal stenosis of lumbar region 12/09/2015   Restrictive lung disease 06/24/2015   Diabetic gastroparesis associated with type 2 diabetes mellitus (HCC) 07/12/2014   Depression 05/22/2014   Uncontrolled type 2 diabetes mellitus with hyperglycemia (HCC) 04/03/2012   Tobacco use disorder 10/26/2009   Essential hypertension 05/25/2007   ICD (implantable cardioverter-defibrillator) in place 05/04/2007   PCP:  Karoline Caldwell, MD Pharmacy:   [facility] [phone] - [place], [place] - [address] AT [facility] [address] [place] [address] Phone: [phone] Fax: [phone]     Social Determinants of Health (SDOH) Social History: SDOH Screenings   Food Insecurity: No Food Insecurity (12/09/2022)  Housing: Low Risk  (12/09/2022)  Transportation Needs: No Transportation Needs (12/09/2022)  Utilities: Not At Risk (12/09/2022)  Alcohol Screen: Low Risk  (12/09/2022)  Depression (PHQ2-9): Low Risk  (01/10/2023)  Financial Resource Strain: Low Risk  (12/09/2022)  Physical Activity: Insufficiently Active (12/09/2022)  Social Connections: Moderately Integrated (12/09/2022)  Stress: Stress Concern Present (12/09/2022)  Tobacco Use: High Risk (03/01/2023)  SDOH Interventions:     Readmission Risk Interventions    06/22/2022   11:43 AM  Readmission Risk Prevention Plan  Transportation Screening Complete  PCP or Specialist Appt within 5-7 Days Complete  Home Care Screening Complete  Medication Review (RN CM) Referral to Pharmacy

## 2023-03-02 NOTE — Progress Notes (Signed)
OT Cancellation Note  Patient Details Name: [name] MRN: [id number] DOB: 11/02/1950   Cancelled Treatment:    Reason Eval/Treat Not Completed: OT screened, no needs identified, will sign off. Pt observed walking in the hall with PT. Per conversation with PT and observation, pt appears to be at baseline with ADLs.   Raynald Kemp, OT Acute Rehabilitation Services Office: [phone]   Pilar Grammes 03/02/2023, 9:34 AM

## 2023-03-02 NOTE — Progress Notes (Signed)
SATURATION QUALIFICATIONS: (This note is used to comply with regulatory documentation for home oxygen)  Patient Saturations on Room Air at Rest = 97%  Patient Saturations on Room Air while Ambulating = 87%   Please briefly explain why patient needs home oxygen: pt ambulated ~200' with rollator and 1x seated rest break. Pt on RA throughout, poor read on SpO2 monitor and pt reading at 87% during gait but immediately following hooked up to continuous pulse ox in room and saturating at 94% on RA. Pt occasionally desats at rest if not taking consistent breaths, with cues for pursed lip breathing pt recovered to 95-98% on RA. RN notified and will do 1x additional walking O2 test.   [name] PT, DPT Acute Rehabilitation Services Office [phone]  03/02/23 10:17 AM

## 2023-03-02 NOTE — Plan of Care (Signed)
  Problem: Education: Goal: Knowledge of General Education information will improve Description: Including pain rating scale, medication(s)/side effects and non-pharmacologic comfort measures Outcome: Adequate for Discharge   Problem: Health Behavior/Discharge Planning: Goal: Ability to manage health-related needs will improve Outcome: Adequate for Discharge   Problem: Clinical Measurements: Goal: Ability to maintain clinical measurements within normal limits will improve Outcome: Adequate for Discharge Goal: Will remain free from infection Outcome: Adequate for Discharge Goal: Diagnostic test results will improve Outcome: Adequate for Discharge Goal: Respiratory complications will improve Outcome: Adequate for Discharge Goal: Cardiovascular complication will be avoided Outcome: Adequate for Discharge   Problem: Activity: Goal: Risk for activity intolerance will decrease Outcome: Adequate for Discharge   Problem: Nutrition: Goal: Adequate nutrition will be maintained Outcome: Adequate for Discharge   Problem: Coping: Goal: Level of anxiety will decrease Outcome: Adequate for Discharge   Problem: Elimination: Goal: Will not experience complications related to bowel motility Outcome: Adequate for Discharge Goal: Will not experience complications related to urinary retention Outcome: Adequate for Discharge   Problem: Pain Managment: Goal: General experience of comfort will improve Outcome: Adequate for Discharge   Problem: Safety: Goal: Ability to remain free from injury will improve Outcome: Adequate for Discharge   Problem: Skin Integrity: Goal: Risk for impaired skin integrity will decrease Outcome: Adequate for Discharge   Problem: Education: Goal: Ability to describe self-care measures that may prevent or decrease complications (Diabetes Survival Skills Education) will improve Outcome: Adequate for Discharge Goal: Individualized Educational Video(s) Outcome:  Adequate for Discharge   Problem: Coping: Goal: Ability to adjust to condition or change in health will improve Outcome: Adequate for Discharge   Problem: Fluid Volume: Goal: Ability to maintain a balanced intake and output will improve Outcome: Adequate for Discharge

## 2023-03-02 NOTE — Discharge Summary (Signed)
Name: [name] MRN: [id number] DOB: 02/23/1951 72 y.o. PCP: Karoline Caldwell, MD  Date of Admission: 03/01/2023  4:11 PM Date of Discharge: 03-02-2023 Attending Physician: Mercie Eon, MD  Discharge Diagnosis: Active Problems:   Shortness of breath   Stage 4 chronic kidney disease (HCC)   Acute on chronic HFrEF (heart failure with reduced ejection fraction) [facility])     Discharge Medications: Allergies as of 03/02/2023       Reactions   Actos [pioglitazone] Other (See Comments)   congestive heart failure   Naproxen Other (See Comments)   500mg  dose made her sleep for two days   Rosiglitazone Hives   Tomato Itching   Hydrocodone-acetaminophen Itching   Hydrocortisone Hives, Itching        Medication List     TAKE these medications    Accu-Chek Softclix Lancet Dev Kit Check BG as directed.   Accu-Chek Softclix Lancets lancets Check BG as directed.   albuterol 108 (90 Base) MCG/ACT inhaler Commonly known as: VENTOLIN HFA Inhale 2 puffs into the lungs every 6 (six) hours as needed for wheezing or shortness of breath.   amiodarone 200 MG tablet Commonly known as: PACERONE Take 1 tablet (200 mg total) by mouth daily.   Anoro Ellipta 62.5-25 MCG/ACT Aepb Generic drug: umeclidinium-vilanterol INHALE 1 PUFF INTO THE LUNGS DAILY What changed: when to take this   apixaban 2.5 MG Tabs tablet Commonly known as: ELIQUIS Take 1 tablet (2.5 mg total) by mouth 2 (two) times daily.   aspirin EC 81 MG tablet Take 81 mg by mouth daily. Swallow whole.   atorvastatin 80 MG tablet Commonly known as: LIPITOR Take 80 mg by mouth at bedtime.   calcium-vitamin D 500-200 MG-UNIT tablet Commonly known as: OSCAL WITH D Take 1 tablet by mouth 2 (two) times daily. What changed: when to take this   dapagliflozin propanediol 10 MG Tabs tablet Commonly known as: Farxiga Take 1 tablet (10 mg total) by mouth daily.   Dupixent 300 MG/2ML Sopn Generic drug: Dupilumab Inject 300  mg into the skin every 14 (fourteen) days.   insulin degludec 100 UNIT/ML FlexTouch Pen Commonly known as: TRESIBA Inject 5 Units into the skin daily.   isosorbide-hydrALAZINE 20-37.5 MG tablet Commonly known as: BiDil Take 1 tablet by mouth 3 (three) times daily.   levocetirizine 5 MG tablet Commonly known as: XYZAL Take 5 mg by mouth at bedtime.   metoprolol succinate 25 MG 24 hr tablet Commonly known as: TOPROL-XL Take 1 tablet (25 mg total) by mouth daily.   OVER THE COUNTER MEDICATION Apply 1 Application topically in the morning and at bedtime. OTC Cream for Eczema   Ozempic (0.25 or 0.5 MG/DOSE) 2 MG/1.5ML Sopn Generic drug: Semaglutide(0.25 or 0.5MG /DOS) Inject 0.5 mg into the skin once a week. What changed: when to take this   Pen Needles 32G X 4 MM Misc 1 Needle by Does not apply route daily.   potassium chloride SA 20 MEQ tablet Commonly known as: KLOR-CON M Take 1 tablet (20 mEq total) by mouth daily.   Torsemide 40 MG Tabs Take 80 mg by mouth 2 (two) times daily. What changed:  medication strength how much to take   triamcinolone ointment 0.1 % Commonly known as: KENALOG Apply topically.         Disposition and follow-up:    [name] was discharged from [facility] in Stable condition.  At the hospital follow up visit please address:  1.  Heart failure with reduced ejection fraction: inquire about symptoms including breath shortness and leg swelling, assess volume status, confirm adherence to medication regimen including torsemide 80mg  twice daily and low-salt diet  2.  Chronic obstructive pulmonary disease: inquire about symptoms including breath shortness and cough, assess respiratory status, confirm adherence to medication regimen including fluticasone-vilanterol and albuterol  3.  Diabetes mellitus II: inquire about symptoms including lightheadedness and blurry vision, check A1c and CBG as indicated, confirm adherence  to dapagliflozin and semaglutide  4.  Labs / imaging needed at time of follow-up: none  5.  Pending labs / tests needing follow-up: none    Follow-up Appointments:  Internal Medicine Clinic, expect a call with the exact date and time   Hospital Course by problem list:  [name] is a 72 year old female with a past medical history of obesity, hyperlipidemia, hypertension, diabetes II, HFrEF post AICD, AFib, CKD IV, COPD, and depression who presented with breath shortness and leg swelling, now admitted for        ---Mild acute heart failure exacerbation  Patient has history of heart failure reduced ejection fraction, most recent echocardiogram performed 07-2022 demonstrated EF 25-30 and severely dilated atria. Home medications include torsemide, dapagliflozin, and isosorbide-hydralazine to which she reports regular adherence. She has been taking torsemide 80mg  twice daily. Presented on 5-29 with about two weeks of breath shortness and leg swelling after eating salty ham on 5-12. History negative for any recent changes in weight, orthopnea, medication adherence, oxygen requirement, and cough quality or frequency. Upon arrival, patient appeared volume overloaded per ED provider and was given one dose of intravenous furosemide. Laboratory testing demonstrated normal BNP 92 with Cr 2.11 and GFR 25 near approximate baseline. Chest radiograph notable for vascular congestion without edema. Presentation consistent with acute heart failure exacerbation secondary to high-salt diet. Respiratory symptoms and volume status improved with administration of one dose of intravenous furosemide. Discharged with home medications and torsemide dose adjusted to 80mg  twice daily. Current dry weight is likely 171lbs.     ---Chronic obstructive pulmonary disease Patient has history of chronic obstructive pulmonary disease managed at home with fluticasone-vilanterol, prescribed albuterol but rarely uses it. Supplements  oxygen with 2L/min during the night and intermittently throughout each day at baseline. Presented on 5-29 with about two weeks of breath shortness. History negative for recent changes in oxygen requirement and cough frequency or productivity. Breathing improved following one dose of intravenous furosemide with SpO2 90-92 during ambulation. Discharged with home medication and oxygen regimen.     ---Diabetes II Patient has history of diabetes II, most recent A1c was 9.0. Home medications include dapagliflozin, semaglutide, and insulin degludec. Blood sugar remained stable throughout hospitalization. Discharged on home regimen.     Discharge Exam:    BP 139/74 (BP Location: Left Arm)   Pulse 95   Temp (!) 97.2 F (36.2 C) (Oral)   Resp 18   Ht 5\' 1"  (1.549 m)   Wt 78 kg   SpO2 90%   BMI 32.49 kg/m   Subjective: Patient reports feeling good this morning, states that hear breathing is completely back to normal. Denies breath shortness, chest pain, and significant leg swelling. Clarified that she has been taking torsemide 80mg  twice daily. Stated that she ate a large salty ham for Mother's Day. Discussed plan to work with physical therapy and possible discharge later today.  Constitutional: chronically ill-appearing elderly female sitting in hospital bed, in no acute distress Head: normocephalic atraumatic, mucous membranes moist  Cardiovascular: regular rate and rhythm, no murmurs Pulmonary: normal work of breathing on room air, no wheezing or crackles on auscultation Musculoskeletal: normal bulk and tone, minimal pitting edema BLE, distal extremities well-perfused Neurological: alert and oriented Skin: warm and dry, hyperpigmented plaques on trunk and extremities Psych: euthymic mood and congruent affect     Pertinent Labs, Studies, and Procedures:   Labs:     Latest Ref Rng & Units 03/02/2023    1:09 AM 03/01/2023    5:15 PM 08/11/2022   11:58 AM  CBC  WBC 4.0 - 10.5 K/uL 9.4   7.4  10.2   Hemoglobin 12.0 - 15.0 g/dL 65.7  84.6  96.2   Hematocrit 36.0 - 46.0 % 37.4  44.5  37.7   Platelets 150 - 400 K/uL 213  218  369       Latest Ref Rng & Units 03/02/2023    1:09 AM 03/01/2023    5:15 PM 09/06/2022   11:14 AM  CMP  Glucose 70 - 99 mg/dL 952  841  324   BUN 8 - 23 mg/dL 34  34  37   Creatinine 0.44 - 1.00 mg/dL 4.01  0.27  2.53   Sodium 135 - 145 mmol/L 140  142  135   Potassium 3.5 - 5.1 mmol/L 3.6  3.5  4.2   Chloride 98 - 111 mmol/L 101  98  94   CO2 22 - 32 mmol/L 29  28  27    Calcium 8.9 - 10.3 mg/dL 8.8  9.4  9.5   Total Protein 6.5 - 8.1 g/dL   7.8   Total Bilirubin 0.3 - 1.2 mg/dL   0.9   Alkaline Phos 38 - 126 U/L   117   AST 15 - 41 U/L   18   ALT 0 - 44 U/L   15     ______________________  Imaging:  DG Chest Port 1 View Result Date: 03/01/2023 IMPRESSION: Enlarged heart with vascular congestion.  Defibrillator.   ______________________  Procedures:  none  ______________________   Discharge Instructions:  Discharge Instructions     Diet - low sodium heart healthy   Complete by: As directed    Discharge instructions   Complete by: As directed    [name],  It was a pleasure taking care of you while you were in the hospital. Your breath shortness and leg swelling were caused by worsening of your heart failure, which was likely triggered by eating salty ham. We gave you extra diuretic medications to remove the extra fluid on your body. After this treatment, your breathing improved and you felt back to normal. When you return home, you will need to continue taking torsemide twice daily. Please take torsemide 80mg  twice daily and follow up with [place] in our Internal Medicine Clinic. You should receive a call soon with the exact date and time.  Please continue to take your other medications and visit the emergency department if your breath shortness returns or worsens.   Increase activity slowly   Complete by: As directed      [name],  It was a pleasure taking care of you while you were in the hospital. Your breath shortness and leg swelling were caused by worsening of your heart failure, which was likely triggered by eating salty ham. We gave you extra diuretic medications to remove the extra fluid on your body. After this treatment, your breathing improved and you felt back to normal. When you return  home, you will need to continue taking torsemide twice daily. Please take torsemide 80mg  twice daily and follow up with [place] in our Internal Medicine Clinic. You should receive a call soon with the exact date and time.  Please continue to take your other medications and visit the emergency department if your breath shortness returns or worsens.   All the best,   Signed: Lajuana Ripple, MD Internal Medicine PGY-1 Pager [phone]

## 2023-03-02 NOTE — Telephone Encounter (Signed)
Call from Pharmacist at [facility] has questions about prescription written for Torsemide today at patient's discharge.  Please call to verify at [phone] dosing.

## 2023-03-02 NOTE — Evaluation (Signed)
Physical Therapy Evaluation Patient Details Name: [name] MRN: [id number] DOB: 14-Sep-1951 Today's Date: 03/02/2023  History of Present Illness  Patient is 72 y.o. female presented to ED with worsening SOB over ~2 weeks, leg swelling, and chest pains. PMH significant for COPD Gold stage II with chronic hypoxic respite failure on nocturnal nasal cannula, chronic combined HFpEF and HFrEF, LVEF 20-25%, status post AICD, IIDM, morbid obesity, cigarette smoker, HTN, HLD, lipoma, depression.   Clinical Impression  [name] is 72 y.o. female admitted with above HPI and diagnosis. Patient is currently limited by functional impairments below (see PT problem list). Patient lives alone and is independent at baseline. Currently pt mobilizing with rollator at mod I to supervision level for transfers and gait. Pt was able to negotiate stairs with min guard for safety and demonstrated safe sequencing for side step pattern with bil UE support on rail. SpO2 94-98% on RA at rest and 87-94% during gait on RA. Patient will benefit from continued skilled PT interventions to address impairments and progress independence with mobility. Acute PT will follow and progress as able.        Recommendations for follow up therapy are one component of a multi-disciplinary discharge planning process, led by the attending physician.  Recommendations may be updated based on patient status, additional functional criteria and insurance authorization.  Follow Up Recommendations       Assistance Recommended at Discharge Intermittent Supervision/Assistance  Patient can return home with the following  A little help with walking and/or transfers;A little help with bathing/dressing/bathroom;Assist for transportation;Help with stairs or ramp for entrance;Assistance with cooking/housework    Equipment Recommendations None recommended by PT  Recommendations for Other Services       Functional Status Assessment Patient has had  a recent decline in their functional status and demonstrates the ability to make significant improvements in function in a reasonable and predictable amount of time.     Precautions / Restrictions Precautions Precautions: Fall Restrictions Weight Bearing Restrictions: No      Mobility  Bed Mobility               General bed mobility comments: pt OOB in recliner    Transfers Overall transfer level: Modified independent Equipment used: Rollator (4 wheels)               General transfer comment: mod I for sit<>stand from recliner and to/from rollator seat. vc's to manage brakes for safety.    Ambulation/Gait Ambulation/Gait assistance: Supervision Gait Distance (Feet): 200 Feet Assistive device: Rollator (4 wheels) Gait Pattern/deviations: Step-through pattern, Decreased stride length, Shuffle, Trunk flexed Gait velocity: decr        Stairs Stairs: Yes Stairs assistance: Min guard Stair Management: One rail Right, Step to pattern, Sideways Number of Stairs: 3    Wheelchair Mobility    Modified Rankin (Stroke Patients Only)       Balance Overall balance assessment: Mild deficits observed, not formally tested                                           Pertinent Vitals/Pain Pain Assessment Pain Assessment: No/denies pain    Home Living Family/patient expects to be discharged to:: Private residence Living Arrangements: Alone Available Help at Discharge: Family;Available PRN/intermittently;Neighbor Type of Home: Apartment Home Access: Stairs to enter Entrance Stairs-Rails: Right Entrance Stairs-Number of Steps: 3   Home  Layout: One level Home Equipment: Cane - single point;Rollator (4 wheels);Toilet riser;Tub bench;BSC/3in1 Additional Comments: has home O2 at night    Prior Function Prior Level of Function : Independent/Modified Independent             Mobility Comments: Has been using rollator and intermittently  SPC. ADLs Comments: Family and friend assist with household cleaning and transportation, but pt reports being capable of being mod I; pt cooks own meals on stovetop.     Hand Dominance   Dominant Hand: Right    Extremity/Trunk Assessment   Upper Extremity Assessment Upper Extremity Assessment: Overall WFL for tasks assessed    Lower Extremity Assessment Lower Extremity Assessment: Generalized weakness    Cervical / Trunk Assessment Cervical / Trunk Assessment: Normal  Communication   Communication: No difficulties  Cognition Arousal/Alertness: Awake/alert Behavior During Therapy: WFL for tasks assessed/performed Overall Cognitive Status: Within Functional Limits for tasks assessed                                          General Comments      Exercises Other Exercises Other Exercises: educated on pursed lip breathing for recovery following mobility   Assessment/Plan    PT Assessment Patient needs continued PT services  PT Problem List Decreased strength;Decreased activity tolerance;Decreased balance;Decreased mobility;Decreased knowledge of use of DME;Obesity;Cardiopulmonary status limiting activity       PT Treatment Interventions DME instruction;Gait training;Stair training;Functional mobility training;Therapeutic activities;Therapeutic exercise;Balance training;Neuromuscular re-education;Patient/family education;Wheelchair mobility training    PT Goals (Current goals can be found in the Care Plan section)  Acute Rehab PT Goals Patient Stated Goal: get home and feel better PT Goal Formulation: With patient Time For Goal Achievement: 03/16/23 Potential to Achieve Goals: Good    Frequency Min 3X/week     Co-evaluation               AM-PAC PT "6 Clicks" Mobility  Outcome Measure Help needed turning from your back to your side while in a flat bed without using bedrails?: A Little Help needed moving from lying on your back to sitting on  the side of a flat bed without using bedrails?: A Little Help needed moving to and from a bed to a chair (including a wheelchair)?: A Little Help needed standing up from a chair using your arms (e.g., wheelchair or bedside chair)?: A Little Help needed to walk in hospital room?: A Little Help needed climbing 3-5 steps with a railing? : A Little 6 Click Score: 18    End of Session Equipment Utilized During Treatment: Gait belt Activity Tolerance: Patient tolerated treatment well Patient left: in chair;with call bell/phone within reach;with family/visitor present Nurse Communication: Mobility status PT Visit Diagnosis: Muscle weakness (generalized) (M62.81);Difficulty in walking, not elsewhere classified (R26.2);Unsteadiness on feet (R26.81)    Time: 1610-9604 PT Time Calculation (min) (ACUTE ONLY): 15 min   Charges:   PT Evaluation $PT Eval Low Complexity: 1 Low          Wynn Maudlin, DPT Acute Rehabilitation Services Office [phone]  03/02/23 10:24 AM

## 2023-03-02 NOTE — Progress Notes (Signed)
  SaO2 on room air at rest = 90% SaO2 on room air while ambulating = 92% [name] did not require supplemental oxygen while ambulating in hallways. Ambulated a total of 450 feet in hallways.      03/02/23 1226  Mobility  Activity Ambulated with assistance in hallway  Assistive Device Front wheel walker (Rollator)  Distance Ambulated (ft) 450 ft  Activity Response Tolerated well  Transport method Wheelchair

## 2023-03-02 NOTE — Progress Notes (Signed)
Nursing Discharge Note  Name: KHADISHA CROSSLIN MRN: [id number]  Admit Date: 03/01/2023  Discharge date: 03/02/2023   Ms. Shadana Masaki is to be discharged home per MD order.  AVS completed. Reviewed with patient at bedside. Highlighted copy provided for patient to take home.  Patient able to verbalize understanding of discharge instructions. PIV removed. Patient stable upon discharge.  Reinforced importance of adherence to a low salt/Heart Healthy diet to prevent re accumulation of fluid for CHF exacerbation.   Discharge Instructions     Diet - low sodium heart healthy   Complete by: As directed    Discharge instructions   Complete by: As directed    Ms Penilla,  It was a pleasure taking care of you while you were in the hospital. Your breath shortness and leg swelling were caused by worsening of your heart failure, which was likely triggered by eating salty ham. We gave you extra diuretic medications to remove the extra fluid on your body. After this treatment, your breathing improved and you felt back to normal. When you return home, you will need to continue taking torsemide twice daily. Please take torsemide 80mg  twice daily and follow up with [place] in our Internal Medicine Clinic. You should receive a call soon with the exact date and time.  Please continue to take your other medications and visit the emergency department if your breath shortness returns or worsens.   Increase activity slowly   Complete by: As directed        Allergies as of 03/02/2023       Reactions   Actos [pioglitazone] Other (See Comments)   congestive heart failure   Naproxen Other (See Comments)   500mg  dose made her sleep for two days   Rosiglitazone Hives   Tomato Itching   Hydrocodone-acetaminophen Itching   Hydrocortisone Hives, Itching        Medication List     TAKE these medications    Accu-Chek Softclix Lancet Dev Kit Check BG as directed.   Accu-Chek Softclix Lancets lancets Check BG as  directed.   albuterol 108 (90 Base) MCG/ACT inhaler Commonly known as: VENTOLIN HFA Inhale 2 puffs into the lungs every 6 (six) hours as needed for wheezing or shortness of breath.   amiodarone 200 MG tablet Commonly known as: PACERONE Take 1 tablet (200 mg total) by mouth daily.   Anoro Ellipta 62.5-25 MCG/ACT Aepb Generic drug: umeclidinium-vilanterol INHALE 1 PUFF INTO THE LUNGS DAILY What changed: when to take this   apixaban 2.5 MG Tabs tablet Commonly known as: ELIQUIS Take 1 tablet (2.5 mg total) by mouth 2 (two) times daily.   aspirin EC 81 MG tablet Take 81 mg by mouth daily. Swallow whole.   atorvastatin 80 MG tablet Commonly known as: LIPITOR Take 80 mg by mouth at bedtime.   calcium-vitamin D 500-200 MG-UNIT tablet Commonly known as: OSCAL WITH D Take 1 tablet by mouth 2 (two) times daily. What changed: when to take this   dapagliflozin propanediol 10 MG Tabs tablet Commonly known as: Farxiga Take 1 tablet (10 mg total) by mouth daily.   Dupixent 300 MG/2ML Sopn Generic drug: Dupilumab Inject 300 mg into the skin every 14 (fourteen) days.   insulin degludec 100 UNIT/ML FlexTouch Pen Commonly known as: TRESIBA Inject 5 Units into the skin daily.   isosorbide-hydrALAZINE 20-37.5 MG tablet Commonly known as: BiDil Take 1 tablet by mouth 3 (three) times daily.   levocetirizine 5 MG tablet Commonly known as: XYZAL Take  5 mg by mouth at bedtime.   metoprolol succinate 25 MG 24 hr tablet Commonly known as: TOPROL-XL Take 1 tablet (25 mg total) by mouth daily.   OVER THE COUNTER MEDICATION Apply 1 Application topically in the morning and at bedtime. OTC Cream for Eczema   Ozempic (0.25 or 0.5 MG/DOSE) 2 MG/1.5ML Sopn Generic drug: Semaglutide(0.25 or 0.5MG /DOS) Inject 0.5 mg into the skin once a week. What changed: when to take this   Pen Needles 32G X 4 MM Misc 1 Needle by Does not apply route daily.   potassium chloride SA 20 MEQ  tablet Commonly known as: KLOR-CON M Take 1 tablet (20 mEq total) by mouth daily.   Torsemide 40 MG Tabs Take 80 mg by mouth 2 (two) times daily. What changed:  medication strength how much to take   triamcinolone ointment 0.1 % Commonly known as: KENALOG Apply topically.         Discharge Instructions/ Education: Discharge instructions given to patient with verbalized understanding. Discharge education completed with patient including: follow up instructions, medication list, discharge activities, and limitations if indicated.  Patient instructed to return to Emergency Department, call 911, or call MD for any changes in condition.  Patient escorted via wheelchair to lobby and discharged home via private automobile.

## 2023-03-03 ENCOUNTER — Ambulatory Visit (HOSPITAL_COMMUNITY)
Admission: RE | Admit: 2023-03-03 | Discharge: 2023-03-03 | Disposition: A | Discharge status: Home / Self Care | Payer: 59 | Source: Ambulatory Visit | Attending: Internal Medicine | Admitting: Internal Medicine

## 2023-03-03 ENCOUNTER — Encounter (HOSPITAL_COMMUNITY): Payer: Self-pay | Admitting: Internal Medicine

## 2023-03-03 ENCOUNTER — Telehealth: Payer: Self-pay

## 2023-03-03 VITALS — BP 100/60 | HR 73 | Wt 170.4 lb

## 2023-03-03 DIAGNOSIS — I4892 Unspecified atrial flutter: Secondary | ICD-10-CM

## 2023-03-03 DIAGNOSIS — I5022 Chronic systolic (congestive) heart failure: Secondary | ICD-10-CM | POA: Diagnosis not present

## 2023-03-03 DIAGNOSIS — F17210 Nicotine dependence, cigarettes, uncomplicated: Secondary | ICD-10-CM | POA: Insufficient documentation

## 2023-03-03 DIAGNOSIS — Z716 Tobacco abuse counseling: Secondary | ICD-10-CM | POA: Insufficient documentation

## 2023-03-03 DIAGNOSIS — N184 Chronic kidney disease, stage 4 (severe): Secondary | ICD-10-CM | POA: Diagnosis not present

## 2023-03-03 DIAGNOSIS — Z794 Long term (current) use of insulin: Secondary | ICD-10-CM | POA: Insufficient documentation

## 2023-03-03 DIAGNOSIS — Z79899 Other long term (current) drug therapy: Secondary | ICD-10-CM | POA: Diagnosis not present

## 2023-03-03 DIAGNOSIS — R0602 Shortness of breath: Secondary | ICD-10-CM | POA: Insufficient documentation

## 2023-03-03 DIAGNOSIS — I428 Other cardiomyopathies: Secondary | ICD-10-CM | POA: Insufficient documentation

## 2023-03-03 DIAGNOSIS — Z7984 Long term (current) use of oral hypoglycemic drugs: Secondary | ICD-10-CM | POA: Diagnosis not present

## 2023-03-03 DIAGNOSIS — E1122 Type 2 diabetes mellitus with diabetic chronic kidney disease: Secondary | ICD-10-CM | POA: Insufficient documentation

## 2023-03-03 DIAGNOSIS — J9610 Chronic respiratory failure, unspecified whether with hypoxia or hypercapnia: Secondary | ICD-10-CM

## 2023-03-03 DIAGNOSIS — Z720 Tobacco use: Secondary | ICD-10-CM

## 2023-03-03 DIAGNOSIS — E669 Obesity, unspecified: Secondary | ICD-10-CM | POA: Diagnosis not present

## 2023-03-03 DIAGNOSIS — I130 Hypertensive heart and chronic kidney disease with heart failure and stage 1 through stage 4 chronic kidney disease, or unspecified chronic kidney disease: Secondary | ICD-10-CM | POA: Insufficient documentation

## 2023-03-03 DIAGNOSIS — I10 Essential (primary) hypertension: Secondary | ICD-10-CM | POA: Diagnosis not present

## 2023-03-03 DIAGNOSIS — Z7901 Long term (current) use of anticoagulants: Secondary | ICD-10-CM | POA: Diagnosis not present

## 2023-03-03 DIAGNOSIS — I480 Paroxysmal atrial fibrillation: Secondary | ICD-10-CM | POA: Insufficient documentation

## 2023-03-03 DIAGNOSIS — Z6832 Body mass index (BMI) 32.0-32.9, adult: Secondary | ICD-10-CM | POA: Diagnosis not present

## 2023-03-03 DIAGNOSIS — J441 Chronic obstructive pulmonary disease with (acute) exacerbation: Secondary | ICD-10-CM | POA: Diagnosis not present

## 2023-03-03 LAB — BASIC METABOLIC PANEL
Anion gap: 9 (ref 5–15)
BUN: 42 mg/dL — ABNORMAL HIGH (ref 8–23)
CO2: 31 mmol/L (ref 22–32)
Calcium: 9.2 mg/dL (ref 8.9–10.3)
Chloride: 96 mmol/L — ABNORMAL LOW (ref 98–111)
Creatinine, Ser: 2.38 mg/dL — ABNORMAL HIGH (ref 0.44–1.00)
GFR, Estimated: 22 mL/min — ABNORMAL LOW (ref >60)
Glucose, Bld: 160 mg/dL — ABNORMAL HIGH (ref 70–99)
Potassium: 4.8 mmol/L (ref 3.5–5.1)
Sodium: 136 mmol/L (ref 135–145)

## 2023-03-03 LAB — BRAIN NATRIURETIC PEPTIDE: B Natriuretic Peptide: 68.9 pg/mL (ref 0.0–100.0)

## 2023-03-03 MED ORDER — AMIODARONE HCL 200 MG PO TABS
100.0000 mg | ORAL_TABLET | Freq: Every day | ORAL | 2 refills | Status: DC
Start: 2023-03-03 — End: 2024-03-25

## 2023-03-03 NOTE — Patient Instructions (Signed)
DECREASE Amiodarone to 100 mg daily.  Labs done today, your results will be available in MyChart, we will contact you for abnormal readings.  Your provider has order Furoscix for you. This is an on-body infuser that gives you a dose of Furosemide.   It will be shipped to your home   [employer] [facility] will call you to discuss before shipping so, PLEASE answer unknown calls  For questions regarding the device call [employer] [facility] at [phone]  Ensure you write down the time you start your infusion so that if there is a problem you will know how long the infusion lasted  Use Furoscix only AS DIRECTED by our office  Dosing Directions:   Day 1= please use the kit today 03/03/23 and HOLD YOUR EVENING DOSE OF TORSEMIDE.  PLEASE WEAR YOUR OXYGEN EVERY DAY.  Your physician recommends that you schedule a follow-up appointment in: 4 months  If you have any questions or concerns before your next appointment please send [place] a message through [place] or call our office at [phone].    TO LEAVE A MESSAGE FOR THE NURSE SELECT OPTION 2, PLEASE LEAVE A MESSAGE INCLUDING: YOUR NAME DATE OF BIRTH CALL BACK NUMBER REASON FOR CALL**this is important as we prioritize the call backs  YOU WILL RECEIVE A CALL BACK THE SAME DAY AS LONG AS YOU CALL BEFORE 4:00 PM  At the [facility], you and your health needs are our priority. As part of our continuing mission to provide you with exceptional heart care, we have created designated Provider Care Teams. These Care Teams include your primary Cardiologist (physician) and Advanced Practice Providers (APPs- Physician Assistants and Nurse Practitioners) who all work together to provide you with the care you need, when you need it.   You may see any of the following providers on your designated Care Team at your next follow up: Dr Arvilla Meres Dr Marca Ancona Dr. Marcos Eke, NP Robbie Lis, [place] [facility] [place], [place] Brynda Peon, NP Karle Plumber, PharmD   Please be sure to bring in all your medications bottles to every appointment.    Thank you for choosing [facility]

## 2023-03-03 NOTE — Progress Notes (Signed)
ReDS Vest / Clip - 03/03/23 1400       ReDS Vest / Clip   Station Marker A    Ruler Value 28    ReDS Value Range Low volume    ReDS Actual Value 35

## 2023-03-03 NOTE — Transitions of Care (Post Inpatient/ED Visit) (Unsigned)
   03/03/2023  Name: [name] MRN: [id number] DOB: [birth year]  Today's TOC FU Call Status: Today's TOC FU Call Status:: Unsuccessul Call (1st Attempt) Unsuccessful Call (1st Attempt) Date: 03/03/23  Attempted to reach the patient regarding the most recent Inpatient/ED visit.  Follow Up Plan: Additional outreach attempts will be made to reach the patient to complete the Transitions of Care (Post Inpatient/ED visit) call.   Signature Karena Addison, LPN [facility] Nurse Health Advisor Direct Dial [phone]

## 2023-03-03 NOTE — Progress Notes (Incomplete)
PCP: Karoline Caldwell, MD HF Cardiologist:  Dr Gala Romney   HPI: Ms Trathen is a 72 y.o. with a history of DMII, COPD, HTN, chronic respiratory failure, tobacco abuse, St Jude ICD, and chronic HFrEF. EF has 25-30% for at least 5 years. Last LHC 2017, normal coronaries.    Admitted 06/2021 with A/C HFrEF. Started on IV lasix. Echo showed EF 20-25% and normal RV. AHF consulted for management, some concern that she may have low output. R/LHC deferred as symptoms improved with diuresis. GDMT was limited by elevated potassium   Admitted 9/23 with sepsis 2/2 cellulitis of RLE. ID consulted. Underwent TEE to r/o endocarditis. TEE (9/23) showed LVEF 30%, moderate TR, no valvular vegetation or device vegetation. Admitted a couple weeks later with hypothermia and hypoglycemia. Insulin stopped and SGLT2i continued. Entresto and spiro held due to low BP.   Admitted 10/23 with A/C HFrEF, AECOPD, and new onset A flutter RVR. Given steroinds + inhalers. Started on amio drip + heparin drip. Diuresed with IV lasix.  Hospital course complicated by AKI. Renal US negative for hydronephrosis. Had RHC prior to discharge. Low filing pressures and CI 2.5. Placed on torsemide 40/40. GDMT limited by AKI. Discharged 08/04/22. Discharge weight 197 pounds.   Post hospital follow up 11/23, NYHA III and volume OK. BiDil started.   Follow up 12/23, stable NYHA III and volume OK. Off spiro and Entresto with recent AKI.   Admitted 03/01/23 overnight with SOB, suspect volume overload from dietary indiscretion. Diuresed with IV lasix, discharged home on torsemide 80 bid.  Today she returns for HF follow up. Overall feeling fine. She wears 2L oxygen only at night. She has SOB walking on flat ground, does OK getting around the house with her cane. Denies palpitations, abnormal bleeding, CP, dizziness, edema, or PND/Orthopnea. Appetite ok. No fever or chills. Weight at home 168 pounds. Taking all medications. Still smoking some, lives  alone.    Cardiac Testing  - RHC (11/23):    RA = 9 RV = 55/10 PA = 44/11 (23) PCW = 9 Fick cardiac output/index = 6.1/3.2 Thermo CO/CI = 4.8/2.5 PVR = 2.3 (Fick) 2.9 (TD) Ao sat = 95% PA sat = 67%, 67% PAPi = 5.0  - Echo (10/23): EF 25-30%, severe LV dysfunction, RV low/normal, mild to moderate MR with moderate MAC, severe TR  - TEE (9/23): EF 30%, moderate TR, no valvular vegetation or device vegetation.    - Echo (10/23): EF 25-30% RV low normal. RA/LA severely dilated. D Sahped septum  - Echo (9/22): EF 20-25% RV normal , LA severely reduced   - Echo (2018): EF 25-30% Grade IDD   - LHC 2017 Normal Coronaries.    ROS: All systems negative except as listed in HPI, PMH and Problem List.  SH:  Social History   Socioeconomic History   Marital status: Significant Other    Spouse name: Not on file   Number of children: 3   Years of education: 7   Highest education level: 11th grade  Occupational History   Occupation: disabled  Tobacco Use   Smoking status: Every Day    Packs/day: 0.50    Years: 45.00    Additional pack years: 0.00    Total pack years: 22.50    Types: Cigarettes   Smokeless tobacco: Never   Tobacco comments:    currently smoking  10 cigs per day  Vaping Use   Vaping Use: Never used  Substance and Sexual Activity  Alcohol use: Not Currently    Alcohol/week: 3.0 standard drinks of alcohol    Types: 3 Shots of liquor per week   Drug use: No   Sexual activity: Yes    Partners: Male    Birth control/protection: Surgical  Other Topics Concern   Not on file  Social History Narrative   ** Merged History Encounter **       Married   Patient enjoys going to [employer], church and spending time with family   Social Determinants of Health   Financial Resource Strain: Low Risk  (12/09/2022)   Overall Financial Resource Strain (CARDIA)    Difficulty of Paying Living Expenses: Not hard at all  Food Insecurity: No Food Insecurity (12/09/2022)    Hunger Vital Sign    Worried About Running Out of Food in the Last Year: Never true    Ran Out of Food in the Last Year: Never true  Transportation Needs: No Transportation Needs (12/09/2022)   PRAPARE - Administrator, Civil Service (Medical): No    Lack of Transportation (Non-Medical): No  Physical Activity: Insufficiently Active (12/09/2022)   Exercise Vital Sign    Days of Exercise per Week: 2 days    Minutes of Exercise per Session: 10 min  Stress: Stress Concern Present (12/09/2022)   [employer] of Occupational Health - Occupational Stress Questionnaire    Feeling of Stress : To some extent  Social Connections: Moderately Integrated (12/09/2022)   Social Connection and Isolation Panel [NHANES]    Frequency of Communication with Friends and Family: More than three times a week    Frequency of Social Gatherings with Friends and Family: More than three times a week    Attends Religious Services: More than 4 times per year    Active Member of [employer] or Organizations: Yes    Attends Banker Meetings: Never    Marital Status: Widowed  Intimate Partner Violence: Not At Risk (12/09/2022)   Humiliation, Afraid, Rape, and Kick questionnaire    Fear of Current or Ex-Partner: No    Emotionally Abused: No    Physically Abused: No    Sexually Abused: No    FH:  Family History  Problem Relation Age of Onset   Stroke Mother    Seizures Father    Heart disease Father    Diabetes Sister    Asthma Maternal Aunt        aunts   Asthma Maternal Uncle        uncles   Heart disease Paternal Aunt        aunts   Heart disease Paternal Uncle        uncles   Heart disease Maternal Aunt        aunts   Heart disease Maternal Uncle        uncles   Heart disease Maternal Grandfather    Breast cancer Daughter    Breast cancer Cousin    Asthma Grandchild    Colon cancer Neg Hx    Colon polyps Neg Hx    Esophageal cancer Neg Hx    Kidney disease Neg Hx    Gallbladder  disease Neg Hx    Past Medical History:  Diagnosis Date   Acute metabolic encephalopathy 07/26/2022   ATN (acute tubular necrosis) (HCC) 07/15/2014   Automatic implantable cardioverter-defibrillator in situ    Automatic implantable cardioverter-defibrillator in situ 05/04/2007   Qualifier: Diagnosis of  By: Levon Hedger  Cardiac defibrillator in situ    Atlas II VR (SJM) implanted by Dr Ladona Ridgel   CHF (congestive heart failure) (HCC)    Chronic combined systolic and diastolic heart failure (HCC)    a. EF 35-40% in past;  b. Echo 7/13:  EF 45-50%, Gr 2 diast dysfn, mild AI, mild MAC, trivial MR, mild LAE, PASP 47.   Chronic ulcer of leg (HCC)    04-09-15 resolved-not a problem.   Colon polyps 04/12/2013   Rectosigmoid polyp   COPD (chronic obstructive pulmonary disease) (HCC)    O2 at night   Depression    Diabetes mellitus    Diabetes mellitus, type 2 (HCC) 04/03/2012   HIGH RISK FEET.. Please have patient take shoes and socks off every visit for visual foot inspection.     Eczema    Elevated alkaline phosphatase level    GGT and 5'nucleotidase 8/13 normal   Health care maintenance 01/22/2013   Surgically absent cervix- no pap needed (Path report 07/2000: uterine body with attached bilateral  adnexa and separate cervix.)   History of oxygen administration    oxygen @ 2 l/m nasally bedtime 24/7   HTN (hypertension)    Hx of cardiac cath    a. LHC 2003 normal;  b. LHC 6/13:  Mild calcification in the LM, o/w normal coronary arteries, EF 45%.    Hyperkalemia 08/08/2017   Hyperlipidemia    Elevated triglyceride in 2019   Hyperlipidemia associated with type 2 diabetes mellitus (HCC) 05/25/2007   Qualifier: Diagnosis of  By: Daphine Deutscher FNP, [place]     Hyperthyroidism, subclinical    HYPERTHYROIDISM, SUBCLINICAL 05/06/2009   Qualifier: Diagnosis of  By: Flonnie Overman     Implantable cardioverter-defibrillator (ICD) generator end of life    Lipoma    Need for hepatitis C  screening test 04/25/2019   NICM (nonischemic cardiomyopathy) (HCC)    Obesity    On home oxygen therapy    "2L; 24/7" (10/11/2016)   Post menopausal syndrome    Shortness of breath 07/14/2017   Skin rash 07/12/2018   Sleep apnea    pt denies 04/12/2013   Current Outpatient Medications  Medication Sig Dispense Refill   Accu-Chek Softclix Lancets lancets Check BG as directed. 100 each 3   albuterol (VENTOLIN HFA) 108 (90 Base) MCG/ACT inhaler Inhale 2 puffs into the lungs every 6 (six) hours as needed for wheezing or shortness of breath. 8 g 2   amiodarone (PACERONE) 200 MG tablet Take 1 tablet (200 mg total) by mouth daily. 90 tablet 2   ANORO ELLIPTA 62.5-25 MCG/ACT AEPB INHALE 1 PUFF INTO THE LUNGS DAILY 60 each 2   apixaban (ELIQUIS) 2.5 MG TABS tablet Take 1 tablet (2.5 mg total) by mouth 2 (two) times daily. 60 tablet 2   aspirin EC 81 MG tablet Take 81 mg by mouth daily. Swallow whole.     atorvastatin (LIPITOR) 80 MG tablet Take 80 mg by mouth at bedtime.     calcium-vitamin D (OSCAL WITH D) 500-200 MG-UNIT tablet Take 1 tablet by mouth 2 (two) times daily. 90 tablet 6   dapagliflozin propanediol (FARXIGA) 10 MG TABS tablet Take 1 tablet (10 mg total) by mouth daily. 90 tablet 1   Dupilumab (DUPIXENT) 300 MG/2ML SOPN Inject 300 mg into the skin every 14 (fourteen) days.     insulin degludec (TRESIBA) 100 UNIT/ML FlexTouch Pen Inject 5 Units into the skin daily. 1.5 mL 2   Insulin Pen Needle (PEN NEEDLES) 32G  X 4 MM MISC 1 Needle by Does not apply route daily. 100 each 1   isosorbide-hydrALAZINE (BIDIL) 20-37.5 MG tablet Take 1 tablet by mouth 3 (three) times daily. 90 tablet 3   Lancets Misc. (ACCU-CHEK SOFTCLIX LANCET DEV) KIT Check BG as directed. 1 kit 1   levocetirizine (XYZAL) 5 MG tablet Take 5 mg by mouth at bedtime.     metoprolol succinate (TOPROL-XL) 25 MG 24 hr tablet Take 1 tablet (25 mg total) by mouth daily. 30 tablet 2   OVER THE COUNTER MEDICATION Apply 1 Application  topically in the morning and at bedtime. OTC Cream for Eczema     potassium chloride SA (KLOR-CON M) 20 MEQ tablet Take 1 tablet (20 mEq total) by mouth daily. 90 tablet 3   Semaglutide,0.25 or 0.5MG /DOS, (OZEMPIC, 0.25 OR 0.5 MG/DOSE,) 2 MG/1.5ML SOPN Inject 0.5 mg into the skin once a week. 3 mL 1   torsemide 40 MG TABS Take 80 mg by mouth 2 (two) times daily. 120 tablet 0   triamcinolone ointment (KENALOG) 0.1 % Apply topically.     No current facility-administered medications for this encounter.   BP 100/60   Pulse 73   Wt 77.3 kg (170 lb 6.4 oz)   SpO2 91% Comment: 2 l n/c  BMI 32.20 kg/m   Wt Readings from Last 3 Encounters:  03/03/23 77.3 kg (170 lb 6.4 oz)  03/02/23 78 kg (171 lb 15.3 oz)  01/10/23 81.6 kg (179 lb 12.8 oz)   PHYSICAL EXAM: General:  NAD. No resp difficulty, arrived in [facility], placed on 2L oxygen HEENT: Normal Neck: Supple. No JVD. Carotids 2+ bilat; no bruits. No lymphadenopathy or thryomegaly appreciated. Cor: PMI nondisplaced. Regular rate & rhythm. No rubs, gallops or murmurs. Lungs: Faint wheeze upper lobes, very diminished BS Abdomen: Soft, obese, nontender, nondistended. No hepatosplenomegaly. No bruits or masses. Good bowel sounds. Extremities: No cyanosis, clubbing, rash, trace BLE edema R>L Neuro: Alert & oriented x 3, cranial nerves grossly intact. Moves all 4 extremities w/o difficulty. Affect pleasant.  ECG (personally reviewed): NSR 74 bpm  ReDs: 35%  Device interrogation (personally reviewed):  CorVue stable  ASSESSMENT & PLAN: 1. Chronic HFrEF  - NICM LHC 2017 normal cors. - Echo (2022):EF 25-30% which has been down for many years.  - Echo (10/23): EF 25-30%, D shaped LV, RV mildly dilated w/ mod elevated RVSP, RV systolic fx mildly reduced, severe BAE, severe TR, mild-mod MR, IVC dilated, assumed RAP 15  - RHC (11/23): well-compensated filling pressures, normal CO and mild PH. - s/p STJ ICD - NYHA III, Volume up a little, REDs 35%.  Suspect COPD main driver of symptoms. - Hold afternoon dose of torsemide and use Furoscix 80 mg SQ x 1 this evening. Will arrange PRN Furoscix for her to have at home. - Continue Farxiga 10 mg daily. - Continue torsemide 80 mg bid/20 KCL daily. - Continue BiDil 1 tab tid. - Continue Toprol XL 25 mg daily - No spiro or Entresto with CKD  - Labs today.  2. PAF - TEE 9/23- no thrombus noted.   - Back in A flutter 10/23. Chemically converted with IV amio.  - NSR on ECG today, QTC 517 msec. - Decrease amiodarone to 100 mg daily.  - Continue Eliquis 2.5 mg bid. No bleeding issues. - She says she will not wear CPAP, so will not pursue sleep study. - Recent amio labs ok. CBC today.   3. CKD IV - Renal u/s (10/23)  no hydronephrosis - Baseline SCr 2-3 - Check BMET  - She has follow up with Dr. Kathrene Bongo soon    4.  DMII - A1c 9.7 (10/23) - On SGLT2i + statin. - Management per PCP.  5. Obesity Body mass index is 32.2 kg/m. - She is on Ozempic.  6. HTN - BP much improved. - Continue GDMT as above.   7. Tobacco Abuse/COPD - Lungs sound tight today, may need addition of steroid - She has nebs at home. - Discussed tobacco cessation - Oxygen 84% initially on RA, then 91% on 2L. Needs to wear oxygen all day.  Follow up in 4 months with Dr. Mickle Plumb FNP-BC  1:59 PM   Patient seen and examined with the above-signed Advanced Practice Provider and/or Housestaff. I personally reviewed laboratory data, imaging studies and relevant notes. I independently examined the patient and formulated the important aspects of the plan. I have edited the note to reflect any of my changes or salient points. I have personally discussed the plan with the patient and/or family.  Continues to struggle with volume status. Recently in ER for SOB and volume overload and received IV lasix. Feels better now. ReDS 35% today. Also continues to smoke. Has cough with clear sputum. No fevers  or chills. Minimal wheeze. Has home nebulizer  General:  Elderly No resp difficulty HEENT: normal Neck: supple. JVP 6-7 Carotids 2+ bilat; no bruits. No lymphadenopathy or thryomegaly appreciated. Cor: PMI nondisplaced. Regular rate & rhythm. No rubs, gallops or murmurs. Lungs: coarse Abdomen: obese soft, nontender, nondistended. No hepatosplenomegaly. No bruits or masses. Good bowel sounds. Extremities: no cyanosis, clubbing, rash, trace edema Neuro: alert & orientedx3, cranial nerves grossly intact. moves all 4 extremities w/o difficulty. Affect pleasant  Continues to struggle with volume status. Will giver her som Furoscix to have at home to manage mild decompensations. Stressed need to stop smoking and watch respiratory status closely. If has more wheezing or develops green sputum or fever will need treatment for COPD flare. Check labs. Decreasea amio to 100 daily.  Arvilla Meres, MD  8:54 PM

## 2023-03-03 NOTE — Progress Notes (Signed)
Medication Samples have been provided to the patient.  Drug name: Furoscix       Strength: 80 mg        Qty: 1  LOT: [id number]  Exp.Date: 10/02/2024  Dosing instructions: Take as directed by heart failure clinic  The patient has been instructed regarding the correct time, dose, and frequency of taking this medication, including desired effects and most common side effects.   Smitty Cords Dayshaun Whobrey 2:43 PM 03/03/2023

## 2023-03-06 ENCOUNTER — Telehealth (HOSPITAL_COMMUNITY): Payer: Self-pay

## 2023-03-06 NOTE — Telephone Encounter (Signed)
Patient called triage about Furoscix Patient completed 1  kit as directed by clinic. Patient made aware of not using any more unless directed by clinic.Patient to continue torsemide. Patient verbalized understanding.

## 2023-03-06 NOTE — Telephone Encounter (Signed)
Order sent to furoscix via website

## 2023-03-07 NOTE — Transitions of Care (Post Inpatient/ED Visit) (Signed)
03/07/2023  Name: [name] MRN: [id number] DOB: [birth year]  Today's TOC FU Call Status: Today's TOC FU Call Status:: Successful TOC FU Call Competed Unsuccessful Call (1st Attempt) Date: 03/03/23 [facility] FU Call Complete Date: 03/07/23  Transition Care Management Follow-up Telephone Call Date of Discharge: 03/02/23 Discharge Facility: [facility]) Type of Discharge: Inpatient Admission Primary Inpatient Discharge Diagnosis:: heart failure How have you been since you were released from the hospital?: Same Any questions or concerns?: No  Items Reviewed: Did you receive and understand the discharge instructions provided?: Yes Medications obtained,verified, and reconciled?: Yes (Medications Reviewed) Any new allergies since your discharge?: No Dietary orders reviewed?: Yes Do you have support at home?: No  Medications Reviewed Today: Medications Reviewed Today     Reviewed by Karena Addison, LPN (Licensed Practical Nurse) on 03/07/23 at 1724  Med List Status: <None>   Medication Order Taking? Sig Documenting Provider Last Dose Status Informant  Accu-Chek Softclix Lancets lancets [id number] Yes Check BG as directed. Mapp, Gaylyn Cheers, MD Taking Active Self  albuterol (VENTOLIN HFA) 108 (90 Base) MCG/ACT inhaler [id number] Yes Inhale 2 puffs into the lungs every 6 (six) hours as needed for wheezing or shortness of breath. Dolan Amen, MD Taking Active Self           Med Note Willa Rough, Jeanene Erb Sep 12, 2022  9:47 AM)    amiodarone (PACERONE) 200 MG tablet [id number] Yes Take 0.5 tablets (100 mg total) by mouth daily. Bensimhon, Bevelyn Buckles, MD Taking Active   ANORO ELLIPTA 62.5-25 MCG/ACT AEPB [id number] Yes INHALE 1 PUFF INTO THE LUNGS DAILY Mapp, Tavien, MD Taking Active Self  apixaban (ELIQUIS) 2.5 MG TABS tablet [id number] Yes Take 1 tablet (2.5 mg total) by mouth 2 (two) times daily. Doran Stabler, DO Taking Active Self  aspirin EC 81 MG tablet [id number] Yes Take 81 mg by mouth  daily. Swallow whole. [provider] Taking Active Self  atorvastatin (LIPITOR) 80 MG tablet [id number] Yes Take 80 mg by mouth at bedtime. [provider] Taking Active Self           Med Note Gunnar Fusi, MELISSA R   Wed Nov 23, 2022 10:56 AM)    calcium-vitamin D (OSCAL WITH D) 500-200 MG-UNIT tablet [id number] Yes Take 1 tablet by mouth 2 (two) times daily. Lanelle Bal, MD Taking Active Self  dapagliflozin propanediol (FARXIGA) 10 MG TABS tablet [id number] Yes Take 1 tablet (10 mg total) by mouth daily. Merrilyn Puma, MD Taking Active Self  Dupilumab (DUPIXENT) 300 MG/2ML SOPN [id number] Yes Inject 300 mg into the skin every 14 (fourteen) days. [provider] Taking Active Self  insulin degludec (TRESIBA) 100 UNIT/ML FlexTouch Pen [id number] Yes Inject 5 Units into the skin daily. Doran Stabler, DO Taking Active Self  Insulin Pen Needle (PEN NEEDLES) 32G X 4 MM MISC [id number] Yes 1 Needle by Does not apply route daily. Doran Stabler, DO Taking Active Self  isosorbide-hydrALAZINE (BIDIL) 20-37.5 MG tablet [id number] Yes Take 1 tablet by mouth 3 (three) times daily. Sherald Hess, NP Taking Active Self  Lancets Misc. (ACCU-CHEK SOFTCLIX LANCET DEV) KIT [id number] Yes Check BG as directed. Mapp, Gaylyn Cheers, MD Taking Active Self  levocetirizine (XYZAL) 5 MG tablet [id number] Yes Take 5 mg by mouth at bedtime. [provider] Taking Active Self  metoprolol succinate (TOPROL-XL) 25 MG 24 hr tablet [id number] Yes Take 1 tablet (25 mg total) by mouth daily. Doran Stabler, DO Taking Active Self  OVER THE COUNTER  MEDICATION [id number] Yes Apply 1 Application topically in the morning and at bedtime. OTC Cream for Eczema [provider] Taking Active Self           Med Note Gunnar Fusi, MELISSA R   Wed Nov 23, 2022 10:57 AM)    potassium chloride SA (KLOR-CON M) 20 MEQ tablet [id number] Yes Take 1 tablet (20 mEq total) by mouth daily. Madison, Anderson Malta, FNP Taking Active Self   Semaglutide,0.25 or 0.5MG /DOS, (OZEMPIC, 0.25 OR 0.5 MG/DOSE,) 2 MG/1.5ML SOPN [id number] Yes Inject 0.5 mg into the skin once a week. Chauncey Mann, DO Taking Active Self  torsemide 40 MG TABS [id number] No Take 80 mg by mouth 2 (two) times daily.  Patient not taking: Reported on 03/07/2023   Crissie Sickles, MD Not Taking Active   triamcinolone ointment (KENALOG) 0.1 % [id number] Yes Apply topically. [provider] Taking Active Self            Home Care and Equipment/Supplies: Were Home Health Services Ordered?: Yes Name of Home Health Agency:: [facility] Has Agency set up a time to come to your home?: Yes First Home Health Visit Date: 03/06/23 Any new equipment or medical supplies ordered?: NA  Functional Questionnaire: Do you need assistance with bathing/showering or dressing?: No Do you need assistance with meal preparation?: No Do you need assistance with eating?: No Do you have difficulty maintaining continence: No Do you need assistance with getting out of bed/getting out of a chair/moving?: No Do you have difficulty managing or taking your medications?: No  Follow up appointments reviewed: PCP Follow-up appointment confirmed?: Yes Date of PCP follow-up appointment?: 03/09/23 Follow-up Provider: [facility] Follow-up appointment confirmed?: NA Do you need transportation to your follow-up appointment?: No Do you understand care options if your condition(s) worsen?: Yes-patient verbalized understanding    SIGNATURE Karena Addison, LPN [facility] Nurse Health Advisor Direct Dial [phone]

## 2023-03-09 ENCOUNTER — Telehealth: Payer: Self-pay | Admitting: *Deleted

## 2023-03-09 ENCOUNTER — Emergency Department (HOSPITAL_COMMUNITY)
Admission: EM | Admit: 2023-03-09 | Discharge: 2023-03-09 | Disposition: A | Discharge status: Home / Self Care | Payer: 59 | Attending: Emergency Medicine | Admitting: Emergency Medicine

## 2023-03-09 ENCOUNTER — Other Ambulatory Visit: Payer: Self-pay

## 2023-03-09 ENCOUNTER — Encounter (HOSPITAL_COMMUNITY): Payer: Self-pay

## 2023-03-09 ENCOUNTER — Emergency Department (HOSPITAL_COMMUNITY): Payer: 59

## 2023-03-09 DIAGNOSIS — J449 Chronic obstructive pulmonary disease, unspecified: Secondary | ICD-10-CM | POA: Insufficient documentation

## 2023-03-09 DIAGNOSIS — I509 Heart failure, unspecified: Secondary | ICD-10-CM | POA: Insufficient documentation

## 2023-03-09 DIAGNOSIS — Z7982 Long term (current) use of aspirin: Secondary | ICD-10-CM | POA: Diagnosis not present

## 2023-03-09 DIAGNOSIS — R062 Wheezing: Secondary | ICD-10-CM | POA: Insufficient documentation

## 2023-03-09 DIAGNOSIS — R072 Precordial pain: Secondary | ICD-10-CM | POA: Insufficient documentation

## 2023-03-09 DIAGNOSIS — Z79899 Other long term (current) drug therapy: Secondary | ICD-10-CM | POA: Insufficient documentation

## 2023-03-09 DIAGNOSIS — E119 Type 2 diabetes mellitus without complications: Secondary | ICD-10-CM | POA: Diagnosis not present

## 2023-03-09 DIAGNOSIS — Z7901 Long term (current) use of anticoagulants: Secondary | ICD-10-CM | POA: Insufficient documentation

## 2023-03-09 DIAGNOSIS — R0602 Shortness of breath: Secondary | ICD-10-CM | POA: Insufficient documentation

## 2023-03-09 DIAGNOSIS — R7989 Other specified abnormal findings of blood chemistry: Secondary | ICD-10-CM | POA: Diagnosis not present

## 2023-03-09 DIAGNOSIS — I110 Hypertensive heart disease with heart failure: Secondary | ICD-10-CM | POA: Insufficient documentation

## 2023-03-09 DIAGNOSIS — Z794 Long term (current) use of insulin: Secondary | ICD-10-CM | POA: Diagnosis not present

## 2023-03-09 DIAGNOSIS — J441 Chronic obstructive pulmonary disease with (acute) exacerbation: Secondary | ICD-10-CM

## 2023-03-09 HISTORY — DX: Encounter for other specified aftercare: Z51.89

## 2023-03-09 LAB — BASIC METABOLIC PANEL
Anion gap: 9 (ref 5–15)
BUN: 35 mg/dL — ABNORMAL HIGH (ref 8–23)
CO2: 31 mmol/L (ref 22–32)
Calcium: 9.3 mg/dL (ref 8.9–10.3)
Chloride: 103 mmol/L (ref 98–111)
Creatinine, Ser: 1.82 mg/dL — ABNORMAL HIGH (ref 0.44–1.00)
GFR, Estimated: 30 mL/min — ABNORMAL LOW (ref >60)
Glucose, Bld: 205 mg/dL — ABNORMAL HIGH (ref 70–99)
Potassium: 4.4 mmol/L (ref 3.5–5.1)
Sodium: 143 mmol/L (ref 135–145)

## 2023-03-09 LAB — CBC WITH DIFFERENTIAL/PLATELET
Abs Immature Granulocytes: 0.02 10*3/uL (ref 0.00–0.07)
Basophils Absolute: 0.1 10*3/uL (ref 0.0–0.1)
Basophils Relative: 1 %
Eosinophils Absolute: 1.2 10*3/uL — ABNORMAL HIGH (ref 0.0–0.5)
Eosinophils Relative: 14 %
HCT: 44.5 % (ref 36.0–46.0)
Hemoglobin: 13.3 g/dL (ref 12.0–15.0)
Immature Granulocytes: 0 %
Lymphocytes Relative: 21 %
Lymphs Abs: 1.8 10*3/uL (ref 0.7–4.0)
MCH: 27 pg (ref 26.0–34.0)
MCHC: 29.9 g/dL — ABNORMAL LOW (ref 30.0–36.0)
MCV: 90.3 fL (ref 80.0–100.0)
Monocytes Absolute: 0.8 10*3/uL (ref 0.1–1.0)
Monocytes Relative: 9 %
Neutro Abs: 4.7 10*3/uL (ref 1.7–7.7)
Neutrophils Relative %: 55 %
Platelets: 199 10*3/uL (ref 150–400)
RBC: 4.93 MIL/uL (ref 3.87–5.11)
RDW: 15.5 % (ref 11.5–15.5)
WBC: 8.6 10*3/uL (ref 4.0–10.5)
nRBC: 0 % (ref 0.0–0.2)

## 2023-03-09 LAB — D-DIMER, QUANTITATIVE: D-Dimer, Quant: 0.63 ug/mL-FEU — ABNORMAL HIGH (ref 0.00–0.50)

## 2023-03-09 LAB — BRAIN NATRIURETIC PEPTIDE: B Natriuretic Peptide: 65.8 pg/mL (ref 0.0–100.0)

## 2023-03-09 LAB — TROPONIN I (HIGH SENSITIVITY)
Troponin I (High Sensitivity): 17 ng/L (ref <18)
Troponin I (High Sensitivity): 17 ng/L (ref <18)

## 2023-03-09 MED ORDER — IPRATROPIUM-ALBUTEROL 0.5-2.5 (3) MG/3ML IN SOLN
3.0000 mL | Freq: Once | RESPIRATORY_TRACT | Status: AC
  Administered 2023-03-09: 3 mL via RESPIRATORY_TRACT
  Filled 2023-03-09: qty 3

## 2023-03-09 NOTE — ED Provider Notes (Signed)
[place] EMERGENCY DEPARTMENT AT [facility] Provider Note   CSN: [id number] Arrival date & time: 03/09/23  1138     History  Chief Complaint  Patient presents with   Shortness of Breath    [name] is a 72 y.o. female with a PMHx of COPD, CHF, hypertension, diabetes who presents emergency department brought in by EMS with concerns for shortness of breath onset prior to arrival.  Patient 3 L via nasal cannula at night.  Patient notes mild sternal chest wall pain.  Has been compliant with her Lasix.  Denies leg swelling.  Patient was given a DuoNeb and Solu-Medrol by EMS prior to arrival.   Per patient chart review: Patient was admitted to the hospital for similar concerns on 5/29 to 5/30.  The history is provided by the patient. No language interpreter was used.       Home Medications Prior to Admission medications   Medication Sig Start Date End Date Taking? Authorizing Provider  Accu-Chek Softclix Lancets lancets Check BG as directed. 11/03/22   Mapp, Gaylyn Cheers, MD  albuterol (VENTOLIN HFA) 108 (90 Base) MCG/ACT inhaler Inhale 2 puffs into the lungs every 6 (six) hours as needed for wheezing or shortness of breath. 04/28/21   Dolan Amen, MD  amiodarone (PACERONE) 200 MG tablet Take 0.5 tablets (100 mg total) by mouth daily. 03/03/23 11/28/23  Bensimhon, Bevelyn Buckles, MD  ANORO ELLIPTA 62.5-25 MCG/ACT AEPB INHALE 1 PUFF INTO THE LUNGS DAILY 07/20/22   Mapp, Gaylyn Cheers, MD  apixaban (ELIQUIS) 2.5 MG TABS tablet Take 1 tablet (2.5 mg total) by mouth 2 (two) times daily. 12/09/22 03/09/23  Doran Stabler, DO  aspirin EC 81 MG tablet Take 81 mg by mouth daily. Swallow whole.    [provider]  atorvastatin (LIPITOR) 80 MG tablet Take 80 mg by mouth at bedtime.    [provider]  calcium-vitamin D (OSCAL WITH D) 500-200 MG-UNIT tablet Take 1 tablet by mouth 2 (two) times daily. 02/12/20   Lanelle Bal, MD  dapagliflozin propanediol (FARXIGA) 10 MG TABS tablet  Take 1 tablet (10 mg total) by mouth daily. 08/18/22   Merrilyn Puma, MD  Dupilumab (DUPIXENT) 300 MG/2ML SOPN Inject 300 mg into the skin every 14 (fourteen) days.    [provider]  insulin degludec (TRESIBA) 100 UNIT/ML FlexTouch Pen Inject 5 Units into the skin daily. 12/09/22 03/09/23  Doran Stabler, DO  Insulin Pen Needle (PEN NEEDLES) 32G X 4 MM MISC 1 Needle by Does not apply route daily. 12/09/22   Doran Stabler, DO  isosorbide-hydrALAZINE (BIDIL) 20-37.5 MG tablet Take 1 tablet by mouth 3 (three) times daily. 08/11/22   Clegg, Amy D, NP  Lancets Misc. (ACCU-CHEK SOFTCLIX LANCET DEV) KIT Check BG as directed. 11/03/22   Mapp, Gaylyn Cheers, MD  levocetirizine (XYZAL) 5 MG tablet Take 5 mg by mouth at bedtime. 02/21/23   [provider]  metoprolol succinate (TOPROL-XL) 25 MG 24 hr tablet Take 1 tablet (25 mg total) by mouth daily. 12/09/22 03/09/23  Doran Stabler, DO  OVER THE COUNTER MEDICATION Apply 1 Application topically in the morning and at bedtime. OTC Cream for Eczema    [provider]  potassium chloride SA (KLOR-CON M) 20 MEQ tablet Take 1 tablet (20 mEq total) by mouth daily. 07/15/22   Milford, Anderson Malta, FNP  Semaglutide,0.25 or 0.5MG /DOS, (OZEMPIC, 0.25 OR 0.5 MG/DOSE,) 2 MG/1.5ML SOPN Inject 0.5 mg into the skin once a week. 09/07/22   Chauncey Mann,  DO  torsemide 40 MG TABS Take 80 mg by mouth 2 (two) times daily. Patient not taking: Reported on 03/07/2023 03/02/23 04/01/23  Crissie Sickles, MD  triamcinolone ointment (KENALOG) 0.1 % Apply topically. 02/20/23   [provider]      Allergies    Actos [pioglitazone], Naproxen, Rosiglitazone, Tomato, Hydrocodone-acetaminophen, and Hydrocortisone    Review of Systems   Review of Systems  Respiratory:  Positive for shortness of breath.   All other systems reviewed and are negative.   Physical Exam Updated Vital Signs BP (!) 169/91   Pulse 71   Temp (!) 97.5 F (36.4 C)   Resp 13   Ht 5\' 1"  (1.549 m)    Wt 76.2 kg   SpO2 98%   BMI 31.74 kg/m  Physical Exam Vitals and nursing note reviewed.  Constitutional:      General: She is not in acute distress.    Appearance: Normal appearance.  Eyes:     General: No scleral icterus.    Extraocular Movements: Extraocular movements intact.  Cardiovascular:     Rate and Rhythm: Normal rate.  Pulmonary:     Effort: Pulmonary effort is normal. No respiratory distress.     Breath sounds: Wheezing present.     Comments: Diffuse expiratory wheezing noted throughout lung fields.  Abdominal:     Palpations: Abdomen is soft. There is no mass.     Tenderness: There is no abdominal tenderness.  Musculoskeletal:        General: Normal range of motion.     Cervical back: Neck supple.     Comments: No appreciable leg swelling noted bilaterally.  Skin:    General: Skin is warm and dry.     Findings: No rash.  Neurological:     Mental Status: She is alert.     Sensory: Sensation is intact.     Motor: Motor function is intact.  Psychiatric:        Behavior: Behavior normal.     ED Results / Procedures / Treatments   Labs (all labs ordered are listed, but only abnormal results are displayed) Labs Reviewed  BASIC METABOLIC PANEL - Abnormal; Notable for the following components:      Result Value   Glucose, Bld 205 (*)    BUN 35 (*)    Creatinine, Ser 1.82 (*)    GFR, Estimated 30 (*)    All other components within normal limits  CBC WITH DIFFERENTIAL/PLATELET - Abnormal; Notable for the following components:   MCHC 29.9 (*)    Eosinophils Absolute 1.2 (*)    All other components within normal limits  BRAIN NATRIURETIC PEPTIDE  TROPONIN I (HIGH SENSITIVITY)  TROPONIN I (HIGH SENSITIVITY)    EKG EKG Interpretation  Date/Time:  Thursday March 09 2023 11:53:23 EDT Ventricular Rate:  76 PR Interval:  187 QRS Duration: 106 QT Interval:  465 QTC Calculation: 523 R Axis:   -66 Text Interpretation: Sinus rhythm Probable left atrial  enlargement Left anterior fascicular block Anteroseptal infarct, age indeterminate Prolonged QT interval No significant change was found Confirmed by Elayne Snare ([id number]) on 03/09/2023 12:11:04 PM  Radiology DG Chest 2 View  Result Date: 03/09/2023 CLINICAL DATA:  Shortness of breath for 2 weeks EXAM: CHEST - 2 VIEW COMPARISON:  X-ray 03/01/2023 FINDINGS: Enlarged cardiopericardial silhouette. Tortuous aorta. Left chest battery pack for defibrillator. Mild central vascular congestion. No edema, pneumothorax, effusion or consolidation. Overlapping cardiac leads. IMPRESSION: Defibrillator.  Enlarged heart with central vascular congestion Electronically  Signed   By: Karen Kays M.D.   On: 03/09/2023 12:46    Procedures Procedures    Medications Ordered in ED Medications  ipratropium-albuterol (DUONEB) 0.5-2.5 (3) MG/3ML nebulizer solution 3 mL (has no administration in time range)    ED Course/ Medical Decision Making/ A&P                             Medical Decision Making Amount and/or Complexity of Data Reviewed Labs: ordered. Radiology: ordered.  Risk Prescription drug management.   Patient presents to the ED complaining of shortness of breath onset prior to arrival.  Patient wears 3 L.  Via nasal cannula at night.  Also complains of midsternal chest pain.  Has been compliant with her Lasix.  Patient was given Solu-Medrol and DuoNeb by EMS prior to arrival.  Patient afebrile, not tachycardic or hypoxic.  On exam patient with diffuse expiratory wheezing noted throughout lung fields. . No pitting edema noted bilaterally. Recent echocardiogram on 07/27/2022 showed EF of 25-30%. Differential diagnosis includes CHF exacerbation, ACS, PTX, PNA.   Co morbidities that complicate the patient evaluation: COPD, CHF, hypertension, diabetes  Additional history obtained:  Additional history obtained from EMS External records from outside source obtained and reviewed including: Patient was  admitted to the hospital for similar concerns on 5/29 to 5/30.  Labs:  I ordered, and personally interpreted labs.  The pertinent results include:   Initial troponin is 17, delta troponin ordered with results pending at time of sign out BNP unremarkable CBC unremarkable BMP with creatinine at 1.82, BUN at 35, GFR at 30, values improved from previous finding, otherwise unremarkable Dimer ordered with results pending at time of sign out.   Imaging: I ordered imaging studies including chest x-ray I independently visualized and interpreted imaging which showed:  Defibrillator.  Enlarged heart with central vascular congestion   I agree with the radiologist interpretation  Patient case discussed with Fayrene Helper, PA-C at sign-out. Plan at sign-out is delta troponin, duoneb, dimer, ambulate with pulse ox, with disposition as per oncoming team likely Discharge home, however, plans may change as per oncoming team. Patient care transferred at sign out.    This chart was dictated using voice recognition software, Dragon. Despite the best efforts of this provider to proofread and correct errors, errors may still occur which can change documentation meaning.  Final Clinical Impression(s) / ED Diagnoses Final diagnoses:  None    Rx / DC Orders ED Discharge Orders     None         Eldra Word A, PA-C 03/09/23 1517    Rexford Maus, DO 03/09/23 1539

## 2023-03-09 NOTE — Discharge Instructions (Signed)
Please continue to use your medication at home as treatment of your shortness of breath.  You may use your rescue inhaler 2 puffs every 4 hours as needed.  Follow-up closely with your doctor for further care.  Return to the ER if you have any concern.

## 2023-03-09 NOTE — ED Triage Notes (Signed)
From home via EMS with c/o shortness of breath x 2 weeks. Hx of COPD and CHF.  Admitted for same CC on 5/29 and discharged on 5/30, returning because she does not feel she is improving. Home supplemental O2 is 2-3L [place] at night. States today she required 3L during the day. Patient alert and oriented x 4, VSS.

## 2023-03-09 NOTE — Progress Notes (Deleted)
CC: hospital f/u  HPI:  [name] is a 72 y.o. female with past medical history of HTN, HLD, T2DM, HFrEF, CKD4, COPD, paroxysmal a-flutter, obesity, spinal stenosis, tobacco use disorder, depression that presents for hospital f/u.    Allergies as of 03/09/2023       Reactions   Actos [pioglitazone] Other (See Comments)   congestive heart failure   Naproxen Other (See Comments)   500mg  dose made her sleep for two days   Rosiglitazone Hives   Tomato Itching   Hydrocodone-acetaminophen Itching   Hydrocortisone Hives, Itching        Medication List        Accurate as of March 09, 2023  7:23 AM. If you have any questions, ask your nurse or doctor.          Accu-Chek Softclix Lancet Dev Kit Check BG as directed.   Accu-Chek Softclix Lancets lancets Check BG as directed.   albuterol 108 (90 Base) MCG/ACT inhaler Commonly known as: VENTOLIN HFA Inhale 2 puffs into the lungs every 6 (six) hours as needed for wheezing or shortness of breath.   amiodarone 200 MG tablet Commonly known as: PACERONE Take 0.5 tablets (100 mg total) by mouth daily.   Anoro Ellipta 62.5-25 MCG/ACT Aepb Generic drug: umeclidinium-vilanterol INHALE 1 PUFF INTO THE LUNGS DAILY   apixaban 2.5 MG Tabs tablet Commonly known as: ELIQUIS Take 1 tablet (2.5 mg total) by mouth 2 (two) times daily.   aspirin EC 81 MG tablet Take 81 mg by mouth daily. Swallow whole.   atorvastatin 80 MG tablet Commonly known as: LIPITOR Take 80 mg by mouth at bedtime.   calcium-vitamin D 500-200 MG-UNIT tablet Commonly known as: OSCAL WITH D Take 1 tablet by mouth 2 (two) times daily.   dapagliflozin propanediol 10 MG Tabs tablet Commonly known as: Farxiga Take 1 tablet (10 mg total) by mouth daily.   Dupixent 300 MG/2ML Sopn Generic drug: Dupilumab Inject 300 mg into the skin every 14 (fourteen) days.   insulin degludec 100 UNIT/ML FlexTouch Pen Commonly known as: TRESIBA Inject 5 Units into the  skin daily.   isosorbide-hydrALAZINE 20-37.5 MG tablet Commonly known as: BiDil Take 1 tablet by mouth 3 (three) times daily.   levocetirizine 5 MG tablet Commonly known as: XYZAL Take 5 mg by mouth at bedtime.   metoprolol succinate 25 MG 24 hr tablet Commonly known as: TOPROL-XL Take 1 tablet (25 mg total) by mouth daily.   OVER THE COUNTER MEDICATION Apply 1 Application topically in the morning and at bedtime. OTC Cream for Eczema   Ozempic (0.25 or 0.5 MG/DOSE) 2 MG/1.5ML Sopn Generic drug: Semaglutide(0.25 or 0.5MG /DOS) Inject 0.5 mg into the skin once a week.   Pen Needles 32G X 4 MM Misc 1 Needle by Does not apply route daily.   potassium chloride SA 20 MEQ tablet Commonly known as: KLOR-CON M Take 1 tablet (20 mEq total) by mouth daily.   Torsemide 40 MG Tabs Take 80 mg by mouth 2 (two) times daily.   triamcinolone ointment 0.1 % Commonly known as: KENALOG Apply topically.         Past Medical History:  Diagnosis Date   Acute metabolic encephalopathy 07/26/2022   ATN (acute tubular necrosis) (HCC) 07/15/2014   Automatic implantable cardioverter-defibrillator in situ    Automatic implantable cardioverter-defibrillator in situ 05/04/2007   Qualifier: Diagnosis of  By: Levon Hedger     Cardiac defibrillator in situ    Atlas II  VR (SJM) implanted by Dr Ladona Ridgel   CHF (congestive heart failure) (HCC)    Chronic combined systolic and diastolic heart failure (HCC)    a. EF 35-40% in past;  b. Echo 7/13:  EF 45-50%, Gr 2 diast dysfn, mild AI, mild MAC, trivial MR, mild LAE, PASP 47.   Chronic ulcer of leg (HCC)    04-09-15 resolved-not a problem.   Colon polyps 04/12/2013   Rectosigmoid polyp   COPD (chronic obstructive pulmonary disease) (HCC)    O2 at night   Depression    Diabetes mellitus    Diabetes mellitus, type 2 (HCC) 04/03/2012   HIGH RISK FEET.. Please have patient take shoes and socks off every visit for visual foot inspection.     Eczema     Elevated alkaline phosphatase level    GGT and 5'nucleotidase 8/13 normal   Health care maintenance 01/22/2013   Surgically absent cervix- no pap needed (Path report 07/2000: uterine body with attached bilateral  adnexa and separate cervix.)   History of oxygen administration    oxygen @ 2 l/m nasally bedtime 24/7   HTN (hypertension)    Hx of cardiac cath    a. LHC 2003 normal;  b. LHC 6/13:  Mild calcification in the LM, o/w normal coronary arteries, EF 45%.    Hyperkalemia 08/08/2017   Hyperlipidemia    Elevated triglyceride in 2019   Hyperlipidemia associated with type 2 diabetes mellitus (HCC) 05/25/2007   Qualifier: Diagnosis of  By: Daphine Deutscher FNP, [place]     Hyperthyroidism, subclinical    HYPERTHYROIDISM, SUBCLINICAL 05/06/2009   Qualifier: Diagnosis of  By: Flonnie Overman     Implantable cardioverter-defibrillator (ICD) generator end of life    Lipoma    Need for hepatitis C screening test 04/25/2019   NICM (nonischemic cardiomyopathy) (HCC)    Obesity    On home oxygen therapy    "2L; 24/7" (10/11/2016)   Post menopausal syndrome    Shortness of breath 07/14/2017   Skin rash 07/12/2018   Sleep apnea    pt denies 04/12/2013   Review of Systems:  per HPI.   Physical Exam: *** There were no vitals filed for this visit. *** Constitutional: Well-developed, well-nourished, appears comfortable  HENT: Normocephalic and atraumatic.  Eyes: EOM are normal. PERRL.  Neck: Normal range of motion.  Cardiovascular: Regular rate, regular rhythm. No murmurs, rubs, or gallops. Normal radial and PT pulses bilaterally. No LE edema.  Pulmonary: Normal respiratory effort. No wheezes, rales, or rhonchi.   Abdominal: Soft. Non-distended. No tenderness. Normal bowel sounds.  Musculoskeletal: Normal range of motion.     Neurological: Alert and oriented to person, place, and time. Non-focal. Skin: warm and dry.    Assessment & Plan:   1. HFrEF/HTN Patient was discharged on 5/30 after  being hospitalized for CHF exacerbation. Patient has a history of HFrEF. Echo from 07/2022 demonstrates EF 25-30% w/ global hypokinesis. Current medications include metoprolol succinate 25 MG daily,  dapagliflozin propanediol 10 MG daily, Torsemide 80 mg BID, isosorbide-hydrALAZINE 20-37.5 MG TID, potassium chloride SA 20 MEQ daily. Patient states that they are *** compliant with these medications. Weight today ***, *** from last visit. Patient states that they do *** check their BP regularly at home. Patient denies HA, lightheadedness, dizziness, CP, palpitations, SOB, or LE swelling. Initial BP today is ***. Repeat BP is ***.   Plan: - Continue metoprolol succinate 25 MG daily, dapagliflozin propanediol 10 MG daily, Torsemide 80 mg BID, isosorbide-hydrALAZINE 20-37.5 MG  TID, potassium chloride SA 20 MEQ daily***  2. T2DM Patient has a history of T2DM. Current medications include dapagliflozin propanediol 10 MG daily, insulin degludec 5 units daily, semaglutide 0.5 mg weekly. Patient states that they are *** compliant with these medications. Patient does *** check their blood sugar at home regularly and notes values ***. Patient denies polyuria, polydipsia, fatigue. Patient states that they do *** visit the ophthalmologist for yearly eye exams. A1c was 9.0% 3 months ago. A1c today ***%.    Plan: - Continue dapagliflozin propanediol 10 MG daily, insulin degludec 5 units daily, semaglutide 0.5 mg weekly*** - Urine ACR today*** - F/u ophthalmology***   3. COPD Patient has a history of COPD. Current medications include albuterol inhaler and Anoro Ellipta (Umeclidinium bromide - Vilanterol). Patient states that they are *** compliant with these medications. Patient denies recent increase in use of these medications***. Patient denies chest pain, SOB, difficulty breathing, cough, increased sputum production, lightheadedness, or dizziness.    Plan: - Continue albuterol inhaler and Anoro Ellipta  (Umeclidinium bromide - Vilanterol)  5. Health Screening: *** -  - Medication refill? ***  -   See Encounters Tab for problem based charting.  No problem-specific Assessment & Plan notes found for this encounter.    Patient seen with Dr. Amadeo Garnet

## 2023-03-09 NOTE — ED Notes (Incomplete)
Blood obtained for D-dimer.

## 2023-03-09 NOTE — ED Notes (Signed)
This tech ambulated the patient in the hallway with a pulse. Pt was able to maintain a steady gait when walking in the halls, and oxygen levels maintained at 93% RA. Greta Doom, PA-C aware.

## 2023-03-09 NOTE — ED Notes (Signed)
Pt provided crackers and a Sprite. (Okay per Provider)

## 2023-03-09 NOTE — Telephone Encounter (Signed)
Call from patient states was unable to get car parked today at [place].  Is short of breath lets the hospital.  Advised to go to ER or Urgent Care.  Patient to have someone take her to the ER  as soon as  possible.

## 2023-03-09 NOTE — ED Provider Notes (Signed)
SOB today.  Has some wheezing.  Received solumedrol and duoneb.  Await delta trop.  Give duoneb and check O2 on ambulation.  Recent hospital admission for CHF exacebation.  On eliquis.  Not concern of PE.    5:25 PM Negative delta troponin.  Potassium within normal limit.  Creatinine is 1.82, improvement from prior.  D-dimer although elevated at 0.63, after age-adjusted it is still within normal limit therefore have low suspicion for PE.  Normal BNP, chest x-ray shows enlarged heart with central vascular congestion.  Patient however does not appear to be fluid overload to suggest CHF exacerbation.  When ambulating without supplemental oxygen patient maintains O2 sats greater than 93%.  At this time I felt patient stable to be discharged home.  Patient had breathing treatment at home that she can use.  She will follow-up closely with her PCP.  Return precaution discussed.  BP 128/63   Pulse 74   Temp 97.6 F (36.4 C) (Oral)   Resp 17   Ht 5\' 1"  (1.549 m)   Wt 76.2 kg   SpO2 97%   BMI 31.74 kg/m   Results for orders placed or performed during the hospital encounter of 03/09/23  Basic metabolic panel  Result Value Ref Range   Sodium 143 135 - 145 mmol/L   Potassium 4.4 3.5 - 5.1 mmol/L   Chloride 103 98 - 111 mmol/L   CO2 31 22 - 32 mmol/L   Glucose, Bld 205 (H) 70 - 99 mg/dL   BUN 35 (H) 8 - 23 mg/dL   Creatinine, Ser 1.61 (H) 0.44 - 1.00 mg/dL   Calcium 9.3 8.9 - 09.6 mg/dL   GFR, Estimated 30 (L) >60 mL/min   Anion gap 9 5 - 15  CBC with Differential  Result Value Ref Range   WBC 8.6 4.0 - 10.5 K/uL   RBC 4.93 3.87 - 5.11 MIL/uL   Hemoglobin 13.3 12.0 - 15.0 g/dL   HCT 04.5 40.9 - 81.1 %   MCV 90.3 80.0 - 100.0 fL   MCH 27.0 26.0 - 34.0 pg   MCHC 29.9 (L) 30.0 - 36.0 g/dL   RDW 91.4 78.2 - 95.6 %   Platelets 199 150 - 400 K/uL   nRBC 0.0 0.0 - 0.2 %   Neutrophils Relative % 55 %   Neutro Abs 4.7 1.7 - 7.7 K/uL   Lymphocytes Relative 21 %   Lymphs Abs 1.8 0.7 - 4.0 K/uL    Monocytes Relative 9 %   Monocytes Absolute 0.8 0.1 - 1.0 K/uL   Eosinophils Relative 14 %   Eosinophils Absolute 1.2 (H) 0.0 - 0.5 K/uL   Basophils Relative 1 %   Basophils Absolute 0.1 0.0 - 0.1 K/uL   Immature Granulocytes 0 %   Abs Immature Granulocytes 0.02 0.00 - 0.07 K/uL  Brain natriuretic peptide  Result Value Ref Range   B Natriuretic Peptide 65.8 0.0 - 100.0 pg/mL  D-dimer, quantitative  Result Value Ref Range   D-Dimer, Quant 0.63 (H) 0.00 - 0.50 ug/mL-FEU  Troponin I (High Sensitivity)  Result Value Ref Range   Troponin I (High Sensitivity) 17 <18 ng/L  Troponin I (High Sensitivity)  Result Value Ref Range   Troponin I (High Sensitivity) 17 <18 ng/L   *Note: Due to a large number of results and/or encounters for the requested time period, some results have not been displayed. A complete set of results can be found in Results Review.   DG Chest 2 View  Result Date: 03/09/2023 CLINICAL DATA:  Shortness of breath for 2 weeks EXAM: CHEST - 2 VIEW COMPARISON:  X-ray 03/01/2023 FINDINGS: Enlarged cardiopericardial silhouette. Tortuous aorta. Left chest battery pack for defibrillator. Mild central vascular congestion. No edema, pneumothorax, effusion or consolidation. Overlapping cardiac leads. IMPRESSION: Defibrillator.  Enlarged heart with central vascular congestion Electronically Signed   By: Karen Kays M.D.   On: 03/09/2023 12:46   DG Chest Port 1 View  Result Date: 03/01/2023 CLINICAL DATA:  Shortness of breath EXAM: PORTABLE CHEST 1 VIEW COMPARISON:  07/25/2022 x-ray and CT angiogram FINDINGS: Enlarged cardiopericardial silhouette. Vascular congestion. No pneumothorax, effusion or edema. Left upper chest defibrillator. Overlapping cardiac leads. IMPRESSION: Enlarged heart with vascular congestion.  Defibrillator. Electronically Signed   By: Karen Kays M.D.   On: 03/01/2023 16:54      Fayrene Helper, PA-C 03/09/23 1821    Pricilla Loveless, MD 03/09/23 [phone]

## 2023-03-16 ENCOUNTER — Ambulatory Visit (INDEPENDENT_AMBULATORY_CARE_PROVIDER_SITE_OTHER): Payer: 59

## 2023-03-16 VITALS — BP 105/59 | HR 71 | Temp 97.7°F | Ht 59.0 in | Wt 171.3 lb

## 2023-03-16 DIAGNOSIS — Z7984 Long term (current) use of oral hypoglycemic drugs: Secondary | ICD-10-CM | POA: Diagnosis not present

## 2023-03-16 DIAGNOSIS — I4892 Unspecified atrial flutter: Secondary | ICD-10-CM

## 2023-03-16 DIAGNOSIS — I5022 Chronic systolic (congestive) heart failure: Secondary | ICD-10-CM | POA: Diagnosis not present

## 2023-03-16 DIAGNOSIS — J440 Chronic obstructive pulmonary disease with acute lower respiratory infection: Secondary | ICD-10-CM

## 2023-03-16 DIAGNOSIS — J984 Other disorders of lung: Secondary | ICD-10-CM

## 2023-03-16 DIAGNOSIS — E119 Type 2 diabetes mellitus without complications: Secondary | ICD-10-CM

## 2023-03-16 DIAGNOSIS — E1165 Type 2 diabetes mellitus with hyperglycemia: Secondary | ICD-10-CM | POA: Diagnosis not present

## 2023-03-16 DIAGNOSIS — J449 Chronic obstructive pulmonary disease, unspecified: Secondary | ICD-10-CM

## 2023-03-16 DIAGNOSIS — Z794 Long term (current) use of insulin: Secondary | ICD-10-CM

## 2023-03-16 LAB — POCT GLYCOSYLATED HEMOGLOBIN (HGB A1C): Hemoglobin A1C: 8 % — AB (ref 4.0–5.6)

## 2023-03-16 LAB — GLUCOSE, CAPILLARY: Glucose-Capillary: 230 mg/dL — ABNORMAL HIGH (ref 70–99)

## 2023-03-16 MED ORDER — ANORO ELLIPTA 62.5-25 MCG/ACT IN AEPB
1.0000 | INHALATION_SPRAY | Freq: Every day | RESPIRATORY_TRACT | 2 refills | Status: DC
Start: 2023-03-16 — End: 2023-07-20

## 2023-03-16 MED ORDER — APIXABAN 2.5 MG PO TABS
2.5000 mg | ORAL_TABLET | Freq: Two times a day (BID) | ORAL | 2 refills | Status: DC
Start: 2023-03-16 — End: 2023-11-10

## 2023-03-16 MED ORDER — ISOSORB DINITRATE-HYDRALAZINE 20-37.5 MG PO TABS
1.0000 | ORAL_TABLET | Freq: Three times a day (TID) | ORAL | 3 refills | Status: DC
Start: 2023-03-16 — End: 2023-12-28

## 2023-03-16 MED ORDER — OZEMPIC (0.25 OR 0.5 MG/DOSE) 2 MG/1.5ML ~~LOC~~ SOPN: 0.5000 mg | PEN_INJECTOR | SUBCUTANEOUS | 1 refills | Status: DC

## 2023-03-16 MED ORDER — DAPAGLIFLOZIN PROPANEDIOL 10 MG PO TABS: 10.0000 mg | ORAL_TABLET | Freq: Every day | ORAL | 1 refills | Status: DC

## 2023-03-16 NOTE — Progress Notes (Signed)
CC: Hospital/ED f/u visit  HPI:  [name] is a 72 y.o. female with past medical history of HTN, T2DM, HFrEF, a-fib, CKD4, COPD, osteoporosis, obesity, depression that presents for hospital/ED f/u visit.    Allergies as of 03/16/2023       Reactions   Actos [pioglitazone] Other (See Comments)   congestive heart failure   Naproxen Other (See Comments)   500mg  dose made her sleep for two days   Rosiglitazone Hives   Tomato Itching   Hydrocodone-acetaminophen Itching   Hydrocortisone Hives, Itching        Medication List        Accurate as of March 16, 2023  4:10 PM. If you have any questions, ask your nurse or doctor.          Accu-Chek Softclix Lancet Dev Kit Check BG as directed.   Accu-Chek Softclix Lancets lancets Check BG as directed.   albuterol 108 (90 Base) MCG/ACT inhaler Commonly known as: VENTOLIN HFA Inhale 2 puffs into the lungs every 6 (six) hours as needed for wheezing or shortness of breath.   amiodarone 200 MG tablet Commonly known as: PACERONE Take 0.5 tablets (100 mg total) by mouth daily.   Anoro Ellipta 62.5-25 MCG/ACT Aepb Generic drug: umeclidinium-vilanterol Inhale 1 puff into the lungs daily.   apixaban 2.5 MG Tabs tablet Commonly known as: ELIQUIS Take 1 tablet (2.5 mg total) by mouth 2 (two) times daily.   aspirin EC 81 MG tablet Take 81 mg by mouth daily. Swallow whole.   atorvastatin 80 MG tablet Commonly known as: LIPITOR Take 80 mg by mouth at bedtime.   calcium-vitamin D 500-200 MG-UNIT tablet Commonly known as: OSCAL WITH D Take 1 tablet by mouth 2 (two) times daily.   dapagliflozin propanediol 10 MG Tabs tablet Commonly known as: Farxiga Take 1 tablet (10 mg total) by mouth daily.   Dupixent 300 MG/2ML Sopn Generic drug: Dupilumab Inject 300 mg into the skin every 14 (fourteen) days.   isosorbide-hydrALAZINE 20-37.5 MG tablet Commonly known as: BiDil Take 1 tablet by mouth 3 (three) times daily.    levocetirizine 5 MG tablet Commonly known as: XYZAL Take 5 mg by mouth at bedtime.   metoprolol succinate 25 MG 24 hr tablet Commonly known as: TOPROL-XL Take 1 tablet (25 mg total) by mouth daily.   OVER THE COUNTER MEDICATION Apply 1 Application topically in the morning and at bedtime. OTC Cream for Eczema   Ozempic (0.25 or 0.5 MG/DOSE) 2 MG/1.5ML Sopn Generic drug: Semaglutide(0.25 or 0.5MG /DOS) Inject 0.5 mg into the skin once a week.   Pen Needles 32G X 4 MM Misc 1 Needle by Does not apply route daily.   potassium chloride SA 20 MEQ tablet Commonly known as: KLOR-CON M Take 1 tablet (20 mEq total) by mouth daily.   Torsemide 40 MG Tabs Take 80 mg by mouth 2 (two) times daily.   triamcinolone ointment 0.1 % Commonly known as: KENALOG Apply topically.         Past Medical History:  Diagnosis Date   Acute metabolic encephalopathy 07/26/2022   ATN (acute tubular necrosis) (HCC) 07/15/2014   Automatic implantable cardioverter-defibrillator in situ    Automatic implantable cardioverter-defibrillator in situ 05/04/2007   Qualifier: Diagnosis of  By: Levon Hedger     Blood transfusion without reported diagnosis    Cardiac defibrillator in situ    Atlas II VR (SJM) implanted by Dr Ladona Ridgel   CHF (congestive heart failure) (HCC)  Chronic combined systolic and diastolic heart failure (HCC)    a. EF 35-40% in past;  b. Echo 7/13:  EF 45-50%, Gr 2 diast dysfn, mild AI, mild MAC, trivial MR, mild LAE, PASP 47.   Chronic ulcer of leg (HCC)    04-09-15 resolved-not a problem.   Colon polyps 04/12/2013   Rectosigmoid polyp   COPD (chronic obstructive pulmonary disease) (HCC)    O2 at night   Depression    Diabetes mellitus    Diabetes mellitus, type 2 (HCC) 04/03/2012   HIGH RISK FEET.. Please have patient take shoes and socks off every visit for visual foot inspection.     Eczema    Elevated alkaline phosphatase level    GGT and 5'nucleotidase 8/13 normal   Health  care maintenance 01/22/2013   Surgically absent cervix- no pap needed (Path report 07/2000: uterine body with attached bilateral  adnexa and separate cervix.)   History of oxygen administration    oxygen @ 2 l/m nasally bedtime 24/7   HTN (hypertension)    Hx of cardiac cath    a. LHC 2003 normal;  b. LHC 6/13:  Mild calcification in the LM, o/w normal coronary arteries, EF 45%.    Hyperkalemia 08/08/2017   Hyperlipidemia    Elevated triglyceride in 2019   Hyperlipidemia associated with type 2 diabetes mellitus (HCC) 05/25/2007   Qualifier: Diagnosis of  By: Daphine Deutscher FNP, [place]     Hyperthyroidism, subclinical    HYPERTHYROIDISM, SUBCLINICAL 05/06/2009   Qualifier: Diagnosis of  By: Flonnie Overman     Implantable cardioverter-defibrillator (ICD) generator end of life    Lipoma    Need for hepatitis C screening test 04/25/2019   NICM (nonischemic cardiomyopathy) (HCC)    Obesity    On home oxygen therapy    "2L; 24/7" (10/11/2016)   Post menopausal syndrome    Shortness of breath 07/14/2017   Skin rash 07/12/2018   Sleep apnea    pt denies 04/12/2013   Review of Systems:  per HPI.   Physical Exam: Vitals:   03/16/23 1448  BP: (!) 105/59  Pulse: 71  Temp: 97.7 F (36.5 C)  TempSrc: Oral  SpO2: 93%  Weight: 171 lb 4.8 oz (77.7 kg)  Height: 4\' 11"  (1.499 m)   Constitutional: Well-developed, well-nourished, appears comfortable  HENT: Normocephalic and atraumatic.  Eyes: EOMI.  Neck: Normal range of motion.  Cardiovascular: Regular rate, regular rhythm. No murmurs, rubs, or gallops. Normal radial and PT pulses bilaterally. No LE edema.  Pulmonary: Normal respiratory effort. Good air movement throughout. No crackles. Minimal diffuse expiratory wheezes in all lung fields.  Abdominal: Soft. Non-distended. No tenderness. Normal bowel sounds.  Musculoskeletal: Normal gait.   Neurological: Alert and oriented to person, place, and time. Non-focal. Skin: warm and dry.     Assessment & Plan:   See Encounters Tab for problem based charting.  COPD (chronic obstructive pulmonary disease) (HCC) Current medications include albuterol inhaler and Anoro Ellipta inhaler (umeclidinium-vilanterol). Patient states that they are compliant with these medications. Patient denies recent increase in use of these medications. She did have an episode of worsening SOB last week that resulted in ED visit. It was suspected that her symptoms were due to COPD exacerbation. She received duonebs and discharged after symptom improvement. Today, patient denies chest pain, SOB, difficulty breathing, increased cough, increased sputum production, lightheadedness, or dizziness. On exam, patient has minimal diffuse expiratory wheezes. Patient reports adherence to her medication regimen but has not refilled inhalers  per chart review. She ensures me that she is using them as instructed. Suspect patient may benefit from addition of inhaled corticosteroid if symptoms reoccur.   Plan: - Continue albuterol inhaler and Anoro Ellipta inhaler (umeclidinium-vilanterol)  Chronic systolic CHF (congestive heart failure) (HCC) Patient was discharged on 5/30 after being hospitalized for CHF exacerbation. Patient has a history of HFrEF. Echo from 07/2022 demonstrates EF 25-30% w/ global hypokinesis. Current medications include metoprolol succinate 25 MG daily, dapagliflozin 10 mg daily, torsemide 80 mg BID, isosorbide-hydrALAZINE 20-37.5 MG TID. Patient is also prescribed potassium chloride SA 20 mEQ daily. Patient states that they are compliant with these medications. Weight today is up 4 lbs from last week. Patient endorses chronic fatigue but denies SOB at this time. Does sleep with pillows behind her back which is chronic. Patient denies HA, lightheadedness, dizziness, CP, palpitations, or worsening LE swelling. Initial BP today is 105/59. Patient last saw cardiology on 5/31. No signs of volume overload on exam  today.   Plan: - Continue metoprolol succinate 25 MG daily, dapagliflozin 10 mg daily, torsemide 80 mg BID, isosorbide-hydrALAZINE 20-37.5 MG TID - Continue potassium chloride SA 20 mEQ daily - Continue f/u w/ cardiology - BMP at next visit  Uncontrolled type 2 diabetes mellitus with hyperglycemia (HCC) Current medications include dapagliflozin 10 mg daily, tresiba 5 units daily, Ozempic 0.5 mg weekly. Patient states that they are compliant with these medications. Reports that she only checks her blood sugar once daily and typically notes values b/t 100s-200s. Denies having lows at home. Patient endorses polyuria but attributes this to torsemide. Patient denies polydipsia. Patient states that they do visit the ophthalmologist for yearly eye exams. A1c was 9.0% 3 months ago. A1c today 8.0%. Blood glucose is in 200s in clinic.   Plan: - Continue dapagliflozin 10 mg daily, tresiba 5 units daily, Ozempic to 0.5 mg weekly - Urine ACR at next visit as patient could not provide urine sample today - F/u ophthalmology    Patient seen with Dr. Oswaldo Done

## 2023-03-16 NOTE — Assessment & Plan Note (Addendum)
Current medications include albuterol inhaler and Anoro Ellipta inhaler (umeclidinium-vilanterol). Patient states that they are compliant with these medications. Patient denies recent increase in use of these medications. She did have an episode of worsening SOB last week that resulted in ED visit. It was suspected that her symptoms were due to COPD exacerbation. She received duonebs and discharged after symptom improvement. Today, patient denies chest pain, SOB, difficulty breathing, increased cough, increased sputum production, lightheadedness, or dizziness. On exam, patient has minimal diffuse expiratory wheezes. Patient reports adherence to her medication regimen but has not refilled inhalers per chart review. She ensures me that she is using them as instructed. Suspect patient may benefit from addition of inhaled corticosteroid if symptoms reoccur.   Plan: - Continue albuterol inhaler and Anoro Ellipta inhaler (umeclidinium-vilanterol)

## 2023-03-16 NOTE — Patient Instructions (Addendum)
Thank you for coming to see [place] in clinic [name].  Plan:  - Please continue taking:     - Albuterol inhaler and Anoro Ellipta inhaler (umeclidinium-vilanterol) for your trouble breathing (COPD)   - Metoprolol succinate 25 MG daily, dapagliflozin 10 mg daily, torsemide 80 mg twice daily, isosorbide-hydrALAZINE 20-37.5 MG three times daily for your heart failure  - Continue potassium chloride SA 20 mEQ daily  - Dapagliflozin 10 mg daily and Ozempic to 0.5 mg weekly for your diabetes  Return Precautions: If you develop worsening shortness of breath, trouble breathing, wheezing, increased cough or mucus production, please call our clinic and visit the emergency department as these can be signs of worsening of your COPD.    It was very nice to see you, thank you for allowing [place] to be involved in your care. We look forward to seeing you next time. Please call our clinic at [phone] if you have any questions or concerns. The best time to call is Monday-Friday from 9am-4pm, but there is someone available 24/7. If after hours or the weekend, call the main hospital number at [phone] and ask for the Internal Medicine Resident On-Call. If you need medication refills, please notify your pharmacy one week in advance and they will send [place] a request.   Please make sure to arrive 15 minutes prior to your next appointment. If you arrive late, you may be asked to reschedule.

## 2023-03-16 NOTE — Assessment & Plan Note (Signed)
Patient was discharged on 5/30 after being hospitalized for CHF exacerbation. Patient has a history of HFrEF. Echo from 07/2022 demonstrates EF 25-30% w/ global hypokinesis. Current medications include metoprolol succinate 25 MG daily, dapagliflozin 10 mg daily, torsemide 80 mg BID, isosorbide-hydrALAZINE 20-37.5 MG TID. Patient is also prescribed potassium chloride SA 20 mEQ daily. Patient states that they are compliant with these medications. Weight today is up 4 lbs from last week. Patient endorses chronic fatigue but denies SOB at this time. Does sleep with pillows behind her back which is chronic. Patient denies HA, lightheadedness, dizziness, CP, palpitations, or worsening LE swelling. Initial BP today is 105/59. Patient last saw cardiology on 5/31. No signs of volume overload on exam today.   Plan: - Continue metoprolol succinate 25 MG daily, dapagliflozin 10 mg daily, torsemide 80 mg BID, isosorbide-hydrALAZINE 20-37.5 MG TID - Continue potassium chloride SA 20 mEQ daily - Continue f/u w/ cardiology - BMP at next visit

## 2023-03-16 NOTE — Assessment & Plan Note (Signed)
Current medications include dapagliflozin 10 mg daily, tresiba 5 units daily, Ozempic 0.5 mg weekly. Patient states that they are compliant with these medications. Reports that she only checks her blood sugar once daily and typically notes values b/t 100s-200s. Denies having lows at home. Patient endorses polyuria but attributes this to torsemide. Patient denies polydipsia. Patient states that they do visit the ophthalmologist for yearly eye exams. A1c was 9.0% 3 months ago. A1c today 8.0%. Blood glucose is in 200s in clinic.   Plan: - Continue dapagliflozin 10 mg daily, tresiba 5 units daily, Ozempic to 0.5 mg weekly - Urine ACR at next visit as patient could not provide urine sample today - F/u ophthalmology

## 2023-03-17 NOTE — Progress Notes (Signed)
Internal Medicine Clinic Attending  Case discussed with Dr. Mapp  At the time of the visit.  We reviewed the resident's history and exam and pertinent patient test results.  I agree with the assessment, diagnosis, and plan of care documented in the resident's note.  

## 2023-04-03 ENCOUNTER — Other Ambulatory Visit (HOSPITAL_BASED_OUTPATIENT_CLINIC_OR_DEPARTMENT_OTHER): Payer: Self-pay

## 2023-04-11 ENCOUNTER — Encounter (HOSPITAL_COMMUNITY): Payer: Self-pay | Admitting: Gastroenterology

## 2023-04-12 NOTE — Progress Notes (Signed)
[name]  Prep instructions- reviewed  Ignacia Palma, MD Cardiologist- [facility]  EKG- 03/10/23 Echo-07/27/22 Cath-08/03/22 Stress-n/a ICD/PM-ICD STJ Blood thinner-Eliqius - wasnt sure when to hold, lvm with office to check on this GLP-1- Ozempic 7 day hold  Hx:  DM,OSA, ICD, CHF, HTN, COPD, Afib/flutter. Last saw cardiology 03/03/23 and will be due for f/u in 4 months. Did go to ED on 6/6 due to SOB they were thinking COPD exac vs CHF exac. They gave her some steroids/duonebs and responded well and went home with f/u with pcp. She saw her PCP on 6/13 and since she said she is doing well, quit smoking and feels like that was a lot of the issue but still using 2L 02 at night mostly. Anesthesia Review: Yes

## 2023-04-20 ENCOUNTER — Encounter (HOSPITAL_COMMUNITY): Payer: Self-pay | Admitting: Anesthesiology

## 2023-04-21 NOTE — Anesthesia Preprocedure Evaluation (Signed)
Anesthesia Evaluation    Reviewed: Allergy & Precautions, Patient's Chart, lab work & pertinent test results, Unable to perform ROS - Chart review only  History of Anesthesia Complications Negative for: history of anesthetic complications  Airway        Dental  (+) Edentulous Upper, Edentulous Lower   Pulmonary neg shortness of breath, sleep apnea , COPD (2L O2 at night and PRN during the day),  oxygen dependent, neg recent URI, Current Smoker and Patient abstained from smoking.          Cardiovascular hypertension, Pt. on medications and Pt. on home beta blockers (-) angina +CHF (NICM, EF 25-30%)  (-) Past MI, (-) Cardiac Stents and (-) CABG + dysrhythmias (on amiodarone, prolonged QT) Atrial Fibrillation + Cardiac Defibrillator + Valvular Problems/Murmurs (mild-moderate MR, severe TR) MR   HLD  RHC 08/03/2022: Assessment: 1. Well compensated fluid status with normal cardiac output 2. Mild PH  Echo 07/27/2022  1. No thrombus seen with Definity contrast. Left ventricular ejection fraction, by estimation, is 25 to 30%. Left ventricular ejection fraction by 3D volume is 26 %. The left ventricle has severely decreased function. The left ventricle demonstrates  global hypokinesis. Left ventricular diastolic function could not be evaluated. There is the interventricular septum is flattened in diastole ('D' shaped left ventricle), consistent with right ventricular volume overload. The average left ventricular  global longitudinal strain is -6.8 %. The global longitudinal strain is abnormal.   2. Right ventricular systolic function is low normal. The right ventricular size is mildly enlarged. There is moderately elevated pulmonary artery systolic pressure.   3. Left atrial size was severely dilated.   4. Right atrial size was severely dilated.   5. The mitral valve is normal in structure. Mild to moderate mitral valve regurgitation. Moderate  mitral annular calcification.   6. Tricuspid valve regurgitation is severe.   7. The aortic valve is tricuspid. There is mild calcification of the aortic valve. Aortic valve regurgitation is trivial. Aortic valve sclerosis/calcification is present, without any evidence of aortic stenosis.   8. The inferior vena cava is dilated in size with <50% respiratory variability, suggesting right atrial pressure of 15 mmHg.   LHC 06/21/2016: 1. Angiographically normal coronary arteries 2. Elevated LVEDP (22 mmHg)      Neuro/Psych neg Seizures PSYCHIATRIC DISORDERS  Depression     Neuromuscular disease (lumbar spinal stenosis)    GI/Hepatic ,neg GERD  ,,Hepatic steatosis Gastroparesis, dysphagia   Endo/Other  diabetes, Poorly Controlled, Type 2 Hyperthyroidism   Renal/GU CRFRenal disease     Musculoskeletal Osteoporosis    Abdominal  (+) + obese  Peds  Hematology negative hematology ROS (+)   Anesthesia Other Findings   Reproductive/Obstetrics                             Anesthesia Physical Anesthesia Plan  ASA: 4  Anesthesia Plan: MAC   Post-op Pain Management:    Induction: Intravenous  PONV Risk Score and Plan: 1 and Propofol infusion and Treatment may vary due to age or medical condition  Airway Management Planned: Natural Airway and Nasal Cannula  Additional Equipment:   Intra-op Plan:   Post-operative Plan:   Informed Consent:   Plan Discussed with:   Anesthesia Plan Comments:         Anesthesia Quick Evaluation

## 2023-04-25 ENCOUNTER — Telehealth (HOSPITAL_COMMUNITY): Payer: Self-pay | Admitting: Cardiology

## 2023-04-25 ENCOUNTER — Other Ambulatory Visit: Payer: Self-pay

## 2023-04-25 MED ORDER — ALBUTEROL SULFATE HFA 108 (90 BASE) MCG/ACT IN AERS: 2.0000 | INHALATION_SPRAY | Freq: Four times a day (QID) | RESPIRATORY_TRACT | 2 refills | Status: DC | PRN

## 2023-04-25 NOTE — Telephone Encounter (Signed)
Patient left message on triage line reports she did not feel well  Increased SOB, CP, and back pain   Returned call for additional details No answer  Unable to leave message

## 2023-05-01 ENCOUNTER — Telehealth: Payer: Self-pay

## 2023-05-01 ENCOUNTER — Telehealth: Payer: Self-pay | Admitting: *Deleted

## 2023-05-01 NOTE — Telephone Encounter (Signed)
[name] from [facility] home health called regarding a order for a portable oxygen tank she stated additional information needs to be included, she stated "portable oxygen tank evaluation" needs to be included on the fax, she is requesting the order to be re faxed to [facility] @ fax:[phone]  [facility] @ [facility] [phone]

## 2023-05-01 NOTE — Telephone Encounter (Signed)
Call to patient offered appointment for 05/02/2023 at 1015 Am.  Patient will come in on tomorrow at 10:15 AM.

## 2023-05-02 ENCOUNTER — Ambulatory Visit: Payer: 59 | Admitting: Student

## 2023-05-02 ENCOUNTER — Encounter: Payer: Self-pay | Admitting: Student

## 2023-05-02 ENCOUNTER — Other Ambulatory Visit: Payer: Self-pay

## 2023-05-02 VITALS — BP 155/86 | HR 80 | Temp 97.6°F | Ht 59.0 in | Wt 177.9 lb

## 2023-05-02 DIAGNOSIS — Z1231 Encounter for screening mammogram for malignant neoplasm of breast: Secondary | ICD-10-CM

## 2023-05-02 DIAGNOSIS — I4892 Unspecified atrial flutter: Secondary | ICD-10-CM

## 2023-05-02 DIAGNOSIS — E1165 Type 2 diabetes mellitus with hyperglycemia: Secondary | ICD-10-CM | POA: Diagnosis not present

## 2023-05-02 DIAGNOSIS — I110 Hypertensive heart disease with heart failure: Secondary | ICD-10-CM

## 2023-05-02 DIAGNOSIS — I5022 Chronic systolic (congestive) heart failure: Secondary | ICD-10-CM | POA: Diagnosis not present

## 2023-05-02 DIAGNOSIS — I10 Essential (primary) hypertension: Secondary | ICD-10-CM

## 2023-05-02 DIAGNOSIS — R062 Wheezing: Secondary | ICD-10-CM | POA: Diagnosis not present

## 2023-05-02 DIAGNOSIS — F17210 Nicotine dependence, cigarettes, uncomplicated: Secondary | ICD-10-CM

## 2023-05-02 DIAGNOSIS — J449 Chronic obstructive pulmonary disease, unspecified: Secondary | ICD-10-CM

## 2023-05-02 DIAGNOSIS — I5023 Acute on chronic systolic (congestive) heart failure: Secondary | ICD-10-CM

## 2023-05-02 DIAGNOSIS — J9691 Respiratory failure, unspecified with hypoxia: Secondary | ICD-10-CM

## 2023-05-02 MED ORDER — IPRATROPIUM-ALBUTEROL 0.5-2.5 (3) MG/3ML IN SOLN
3.0000 mL | Freq: Once | RESPIRATORY_TRACT | Status: AC
Start: 2023-05-02 — End: 2023-05-02
  Administered 2023-05-02: 3 mL via RESPIRATORY_TRACT

## 2023-05-02 NOTE — Assessment & Plan Note (Addendum)
Patient did not take her BP medications this AM Vitals:   05/02/23 1020  BP: (!) 155/86    -Recheck at follow up

## 2023-05-02 NOTE — Patient Instructions (Addendum)
Thank you, Ms.Loretha Stapler for allowing [place] to provide your care today. Today we discussed your wheezing. Remember to use your albuterol inhaler as needed. If this fails to help you improve your current symptoms, please call us back for re-evaluation.  For now, continue the rest of your medications as you have been taking them  Referrals: -for the supplemental oxygen order. A member of our staff will call you with more information about it.  I have ordered the following labs for you:  Lab Orders         BMP8+Anion Gap         Microalbumin / Creatinine Urine Ratio      I will call if any are abnormal. All of your labs can be accessed through "My Chart".   My Chart Access: [email or web address]  Please follow-up in: 3 months or earlier if there are any concerning symptoms    We look forward to seeing you next time. Please call our clinic at [phone] if you have any questions or concerns. The best time to call is Monday-Friday from 9am-4pm, but there is someone available 24/7. If after hours or the weekend, call the main hospital number and ask for the Internal Medicine Resident On-Call. If you need medication refills, please notify your pharmacy one week in advance and they will send [place] a request.   Thank you for letting us take part in your care. Wishing you the best!  Morene Crocker, MD 05/02/2023, 10:57 AM [facility] Internal Medicine Resident, PGY-1

## 2023-05-02 NOTE — Progress Notes (Signed)
Subjective:  CC: Wheezing  HPI:  [name] is a 72 y.o. female with a past medical history stated below and presents today for follow up in the setting of increased wheezing. She is also requesting DME order for nocturnal supplemental O2. Please see problem based assessment and plan for additional details.  Past Medical History:  Diagnosis Date   Acute metabolic encephalopathy 07/26/2022   ATN (acute tubular necrosis) (HCC) 07/15/2014   Automatic implantable cardioverter-defibrillator in situ    Automatic implantable cardioverter-defibrillator in situ 05/04/2007   Qualifier: Diagnosis of  By: Levon Hedger     Blood transfusion without reported diagnosis    Cardiac defibrillator in situ    Atlas II VR (SJM) implanted by Dr Ladona Ridgel   CHF (congestive heart failure) (HCC)    Chronic combined systolic and diastolic heart failure (HCC)    a. EF 35-40% in past;  b. Echo 7/13:  EF 45-50%, Gr 2 diast dysfn, mild AI, mild MAC, trivial MR, mild LAE, PASP 47.   Chronic ulcer of leg (HCC)    04-09-15 resolved-not a problem.   Colon polyps 04/12/2013   Rectosigmoid polyp   COPD (chronic obstructive pulmonary disease) (HCC)    O2 at night   Depression    Diabetes mellitus    Diabetes mellitus, type 2 (HCC) 04/03/2012   HIGH RISK FEET.. Please have patient take shoes and socks off every visit for visual foot inspection.     Eczema    Elevated alkaline phosphatase level    GGT and 5'nucleotidase 8/13 normal   Health care maintenance 01/22/2013   Surgically absent cervix- no pap needed (Path report 07/2000: uterine body with attached bilateral  adnexa and separate cervix.)   History of oxygen administration    oxygen @ 2 l/m nasally bedtime 24/7   HTN (hypertension)    Hx of cardiac cath    a. LHC 2003 normal;  b. LHC 6/13:  Mild calcification in the LM, o/w normal coronary arteries, EF 45%.    Hyperkalemia 08/08/2017   Hyperlipidemia    Elevated triglyceride in 2019    Hyperlipidemia associated with type 2 diabetes mellitus (HCC) 05/25/2007   Qualifier: Diagnosis of  By: Daphine Deutscher FNP, [place]     Hyperthyroidism, subclinical    HYPERTHYROIDISM, SUBCLINICAL 05/06/2009   Qualifier: Diagnosis of  By: Flonnie Overman     Implantable cardioverter-defibrillator (ICD) generator end of life    Lipoma    Need for hepatitis C screening test 04/25/2019   NICM (nonischemic cardiomyopathy) (HCC)    Obesity    On home oxygen therapy    "2L; 24/7" (10/11/2016)   Post menopausal syndrome    Shortness of breath 07/14/2017   Skin rash 07/12/2018   Sleep apnea    pt denies 04/12/2013    Current Outpatient Medications on File Prior to Visit  Medication Sig Dispense Refill   Accu-Chek Softclix Lancets lancets Check BG as directed. 100 each 3   albuterol (VENTOLIN HFA) 108 (90 Base) MCG/ACT inhaler Inhale 2 puffs into the lungs every 6 (six) hours as needed for wheezing or shortness of breath. 8 g 2   amiodarone (PACERONE) 200 MG tablet Take 0.5 tablets (100 mg total) by mouth daily. 45 tablet 2   apixaban (ELIQUIS) 2.5 MG TABS tablet Take 1 tablet (2.5 mg total) by mouth 2 (two) times daily. 60 tablet 2   aspirin EC 81 MG tablet Take 81 mg by mouth daily. Swallow whole.  atorvastatin (LIPITOR) 80 MG tablet Take 80 mg by mouth at bedtime.     calcium-vitamin D (OSCAL WITH D) 500-200 MG-UNIT tablet Take 1 tablet by mouth 2 (two) times daily. 90 tablet 6   dapagliflozin propanediol (FARXIGA) 10 MG TABS tablet Take 1 tablet (10 mg total) by mouth daily. 90 tablet 1   insulin degludec (TRESIBA FLEXTOUCH) 100 UNIT/ML FlexTouch Pen Inject 5 Units into the skin daily.     Insulin Pen Needle (PEN NEEDLES) 32G X 4 MM MISC 1 Needle by Does not apply route daily. 100 each 1   isosorbide-hydrALAZINE (BIDIL) 20-37.5 MG tablet Take 1 tablet by mouth 3 (three) times daily. 90 tablet 3   Lancets Misc. (ACCU-CHEK SOFTCLIX LANCET DEV) KIT Check BG as directed. 1 kit 1   levocetirizine  (XYZAL) 5 MG tablet Take 5 mg by mouth at bedtime.     metoprolol succinate (TOPROL-XL) 25 MG 24 hr tablet Take 25 mg by mouth daily.     OZEMPIC, 0.25 OR 0.5 MG/DOSE, 2 MG/3ML SOPN Inject 0.25 mg into the skin every Wednesday.     potassium chloride SA (KLOR-CON M) 20 MEQ tablet Take 1 tablet (20 mEq total) by mouth daily. 90 tablet 3   torsemide (DEMADEX) 20 MG tablet Take 80 mg by mouth 2 (two) times daily.     triamcinolone ointment (KENALOG) 0.1 % Apply 1 Application topically 2 (two) times daily. Mix with Eucerin cream     umeclidinium-vilanterol (ANORO ELLIPTA) 62.5-25 MCG/ACT AEPB Inhale 1 puff into the lungs daily. 60 each 2   No current facility-administered medications on file prior to visit.    Family History  Problem Relation Age of Onset   Stroke Mother    Seizures Father    Heart disease Father    Diabetes Sister    Asthma Maternal Aunt        aunts   Asthma Maternal Uncle        uncles   Heart disease Paternal Aunt        aunts   Heart disease Paternal Uncle        uncles   Heart disease Maternal Aunt        aunts   Heart disease Maternal Uncle        uncles   Heart disease Maternal Grandfather    Breast cancer Daughter    Breast cancer Cousin    Asthma Grandchild    Colon cancer Neg Hx    Colon polyps Neg Hx    Esophageal cancer Neg Hx    Kidney disease Neg Hx    Gallbladder disease Neg Hx     Social History   Socioeconomic History   Marital status: Significant Other    Spouse name: Not on file   Number of children: 3   Years of education: 11   Highest education level: 11th grade  Occupational History   Occupation: disabled  Tobacco Use   Smoking status: Every Day    Current packs/day: 0.50    Average packs/day: 0.5 packs/day for 45.0 years (22.5 ttl pk-yrs)    Types: Cigarettes   Smokeless tobacco: Never   Tobacco comments:    currently smoking  10 cigs per day  Vaping Use   Vaping status: Never Used  Substance and Sexual Activity    Alcohol use: Not Currently    Alcohol/week: 3.0 standard drinks of alcohol    Types: 3 Shots of liquor per week   Drug use: No  Sexual activity: Yes    Partners: Male    Birth control/protection: Surgical  Other Topics Concern   Not on file  Social History Narrative   ** Merged History Encounter **       Married   Patient enjoys going to [employer], church and spending time with family   Social Determinants of Health   Financial Resource Strain: Low Risk  (12/09/2022)   Overall Financial Resource Strain (CARDIA)    Difficulty of Paying Living Expenses: Not hard at all  Food Insecurity: No Food Insecurity (12/09/2022)   Hunger Vital Sign    Worried About Running Out of Food in the Last Year: Never true    Ran Out of Food in the Last Year: Never true  Transportation Needs: No Transportation Needs (12/09/2022)   PRAPARE - Administrator, Civil Service (Medical): No    Lack of Transportation (Non-Medical): No  Physical Activity: Insufficiently Active (12/09/2022)   Exercise Vital Sign    Days of Exercise per Week: 2 days    Minutes of Exercise per Session: 10 min  Stress: Stress Concern Present (12/09/2022)   [employer] of Occupational Health - Occupational Stress Questionnaire    Feeling of Stress : To some extent  Social Connections: Moderately Integrated (12/09/2022)   Social Connection and Isolation Panel [NHANES]    Frequency of Communication with Friends and Family: More than three times a week    Frequency of Social Gatherings with Friends and Family: More than three times a week    Attends Religious Services: More than 4 times per year    Active Member of [employer] or Organizations: Yes    Attends Banker Meetings: Never    Marital Status: Widowed  Intimate Partner Violence: Not At Risk (12/09/2022)   Humiliation, Afraid, Rape, and Kick questionnaire    Fear of Current or Ex-Partner: No    Emotionally Abused: No    Physically Abused: No    Sexually  Abused: No    Review of Systems: ROS negative except for what is noted on the assessment and plan.  Objective:   Vitals:   05/02/23 1020  BP: (!) 155/86  Pulse: 80  Temp: 97.6 F (36.4 C)  TempSrc: Oral  SpO2: 93%  Weight: 177 lb 14.4 oz (80.7 kg)  Height: 4\' 11"  (1.499 m)    Physical Exam: Constitutional: well-appearing elderly woman sitting in wheel chair wearing supplemental O2, in no acute distress HENT: normocephalic atraumatic, mucous membranes moist Neck: supple, no significant neck vein distension Cardiovascular: regular rate and rhythm, no m/r/g Pulmonary/Chest: normal work of breathing on 2L , clear to auscultation bilaterally in apices, mild crackles in bilateral bases with expiratory wheezing. Abdominal: soft, non-tender, non-distended MSK: normal bulk and tone Neurological: alert & oriented x 3, responding appropriately to questions Skin: warm and dry Psych: Pleasant mood and affect     Assessment & Plan:   Essential hypertension Patient did not take her BP medications this AM Vitals:   05/02/23 1020  BP: (!) 155/86    -Recheck at follow up  Uncontrolled type 2 diabetes mellitus with hyperglycemia (HCC) -Urine microalbumin Cr ratio today  COPD (chronic obstructive pulmonary disease) (HCC) Patient presents to clinic with mild increase in wheezing for the past week or so. Patient has been compliant with her Anoro Ellipta but has not been using Albuterol inhaler PRN. She denies chest pain, recent URI symptoms, increased cough or sputum production. At least not more than her usual  baseline. She has cut down on her use of cigarettes significantly to 1-2 in the past month. On exam, there is increased expiratory wheezing on bilateral bases otherwise mild crackles noted on prior documentation.  Pulse oximetry tested at rest and on ambulation. Patient desaturated to 83% on RA with improvement on 2L supplemental O2 up to 98%. This is in line with the liters of  supplemental O2 that she has been using at home and on recent cardiology visit on 02/2023. Overall, no overt signs or symptoms of COPD exacerbation Plan -Continue Anoro Ellipta -Use PRN albuterol inhaler -In-office duoneb treatment with improvement in expiratory wheezing -DME referral for nightly and persistent supplemental O2 at 2-3L to maintain oxygen saturation between 89-93%  Chronic systolic CHF (congestive heart failure) (HCC) HFrEF 07/2022 echo with EF of 20-30% with ICD in place. Patient is on metoprolol succinate 25 MG daily, dapagliflozin 10 mg daily, isosorbide-hydrALAZINE 20-37.5 MG TID, and Kclor 20 mEq. Last admission on 02/2023 for acute on chronic HF, discharged with Torsemide 80 mg BID. Discharge weight 171lb. Patient reports adherence to medication with good daily urine output. Subjectively, she denies signs of volume overload on her legs or arms. Denies chest pain, lightheadedness, orthopnea or PND. Has baseline SOB that has been improved since on supplemental O2 2-3L at home.   Mild non pitting edema around bilateral ankles otherwise no overt evidence of volume overload on exam. -Continue regimen as above -BMP today to reassess electrolyte abnormalities on increased dose of torsemide and renal function  Paroxysmal atrial flutter (HCC) Adherent to Eliquis BID. Denies palpitations, chest pain, lightheadedness. Regular rhythm on exam today. - Continue Eliquis BID    Return in about 2 months (around 07/03/2023) for DM, COPD, HTN, and HF follow up.   Patient discussed with Dr. Rosalia Hammers, MD [facility] Internal Medicine Program - PGY-2 05/02/2023, 1:01 PM

## 2023-05-02 NOTE — Progress Notes (Signed)
SATURATION QUALIFICATIONS: (This note is used to comply with regulatory documentation for home oxygen)   Patient Saturations on Room Air at Rest = 94%   Patient Saturations on Room Air while Ambulating = 83%   Patient Saturations on 2 Liters of oxygen while Ambulating = 98%

## 2023-05-02 NOTE — Assessment & Plan Note (Signed)
Patient presents to clinic with mild increase in wheezing for the past week or so. Patient has been compliant with her Anoro Ellipta but has not been using Albuterol inhaler PRN. She denies chest pain, recent URI symptoms, increased cough or sputum production. At least not more than her usual baseline. She has cut down on her use of cigarettes significantly to 1-2 in the past month. On exam, there is increased expiratory wheezing on bilateral bases otherwise mild crackles noted on prior documentation.  Pulse oximetry tested at rest and on ambulation. Patient desaturated to 83% on RA with improvement on 2L supplemental O2 up to 98%. This is in line with the liters of supplemental O2 that she has been using at home and on recent cardiology visit on 02/2023. Overall, no overt signs or symptoms of COPD exacerbation Plan -Continue Anoro Ellipta -Use PRN albuterol inhaler -In-office duoneb treatment with improvement in expiratory wheezing -DME referral for nightly and persistent supplemental O2 at 2-3L to maintain oxygen saturation between 89-93%

## 2023-05-02 NOTE — Assessment & Plan Note (Signed)
HFrEF 07/2022 echo with EF of 20-30% with ICD in place. Patient is on metoprolol succinate 25 MG daily, dapagliflozin 10 mg daily, isosorbide-hydrALAZINE 20-37.5 MG TID, and Kclor 20 mEq. Last admission on 02/2023 for acute on chronic HF, discharged with Torsemide 80 mg BID. Discharge weight 171lb. Patient reports adherence to medication with good daily urine output. Subjectively, she denies signs of volume overload on her legs or arms. Denies chest pain, lightheadedness, orthopnea or PND. Has baseline SOB that has been improved since on supplemental O2 2-3L at home.   Mild non pitting edema around bilateral ankles otherwise no overt evidence of volume overload on exam. -Continue regimen as above -BMP today to reassess electrolyte abnormalities on increased dose of torsemide and renal function

## 2023-05-02 NOTE — Addendum Note (Signed)
Addended by: Morene Crocker on: 05/02/2023 03:29 PM   Modules accepted: Orders

## 2023-05-02 NOTE — Assessment & Plan Note (Signed)
-  Urine microalbumin Cr ratio today

## 2023-05-02 NOTE — Assessment & Plan Note (Signed)
Adherent to Eliquis BID. Denies palpitations, chest pain, lightheadedness. Regular rhythm on exam today. - Continue Eliquis BID

## 2023-05-03 NOTE — Addendum Note (Signed)
Addended by: Morene Crocker on: 05/03/2023 09:11 AM   Modules accepted: Level of Service

## 2023-05-03 NOTE — Progress Notes (Signed)
 Internal Medicine Clinic Attending  Case discussed with the resident physician at the time of the visit.  We reviewed the patient's history, exam, and pertinent patient test results.  I agree with the assessment, diagnosis, and plan of care documented in the resident's note.

## 2023-05-03 NOTE — Progress Notes (Signed)
Patient called and aware of improving renal function tests. Chronic metabolic alkalosis and stable electrolytes. Patient was advised to stay hydrated in the Summer, monitoring for signs of dehydration, altered mental status, or volume overload.

## 2023-05-04 NOTE — Progress Notes (Signed)
Improvement from prior. Patient is on SGLT2i at this time. No changes to medications; will continue to monitor yearly.

## 2023-05-08 ENCOUNTER — Telehealth (HOSPITAL_COMMUNITY): Payer: Self-pay

## 2023-05-08 ENCOUNTER — Telehealth: Payer: Self-pay | Admitting: *Deleted

## 2023-05-08 NOTE — Progress Notes (Signed)
PCP: Kathleen Lime, MD HF Cardiologist:  Dr Gala Romney   HPI: Ms Rosser is a 72 y.o. with a history of DMII, COPD, HTN, chronic respiratory failure, tobacco abuse, St Jude ICD, and chronic HFrEF. EF has 25-30% for at least 5 years. Last LHC 2017, normal coronaries.    Admitted 06/2021 with A/C HFrEF. Started on IV lasix. Echo showed EF 20-25% and normal RV. AHF consulted for management, some concern that she may have low output. R/LHC deferred as symptoms improved with diuresis. GDMT was limited by elevated potassium   Admitted 9/23 with sepsis 2/2 cellulitis of RLE. ID consulted. Underwent TEE to r/o endocarditis. TEE (9/23) showed LVEF 30%, moderate TR, no valvular vegetation or device vegetation. Admitted a couple weeks later with hypothermia and hypoglycemia. Insulin stopped and SGLT2i continued. Entresto and spiro held due to low BP.   Admitted 10/23 with A/C HFrEF, AECOPD, and new onset A flutter RVR. Given steroinds + inhalers. Started on amio drip + heparin drip. Diuresed with IV lasix.  Hospital course complicated by AKI. Renal US negative for hydronephrosis. Had RHC prior to discharge. Low filing pressures and CI 2.5. Placed on torsemide 40/40. GDMT limited by AKI. Discharged 08/04/22. Discharge weight 197 pounds.   Post hospital follow up 11/23, NYHA III and volume OK. BiDil started.   Follow up 12/23, stable NYHA III and volume OK. Off spiro and Entresto with recent AKI.   Admitted 03/01/23 overnight with SOB, suspect volume overload from dietary indiscretion. Diuresed with IV lasix, discharged home on torsemide 80 bid.  Today she returns for HF follow up. Overall feeling fine. She wears 2L oxygen only at night. She has SOB walking on flat ground, does OK getting around the house with her cane. Denies palpitations, abnormal bleeding, CP, dizziness, edema, or PND/Orthopnea. Appetite ok. No fever or chills. Weight at home 168 pounds. Taking all medications. Still smoking some, lives  alone.    Cardiac Testing  - RHC (11/23):    RA = 9 RV = 55/10 PA = 44/11 (23) PCW = 9 Fick cardiac output/index = 6.1/3.2 Thermo CO/CI = 4.8/2.5 PVR = 2.3 (Fick) 2.9 (TD) Ao sat = 95% PA sat = 67%, 67% PAPi = 5.0  - Echo (10/23): EF 25-30%, severe LV dysfunction, RV low/normal, mild to moderate MR with moderate MAC, severe TR  - TEE (9/23): EF 30%, moderate TR, no valvular vegetation or device vegetation.    - Echo (10/23): EF 25-30% RV low normal. RA/LA severely dilated. D Sahped septum  - Echo (9/22): EF 20-25% RV normal , LA severely reduced   - Echo (2018): EF 25-30% Grade IDD   - LHC 2017 Normal Coronaries.    ROS: All systems negative except as listed in HPI, PMH and Problem List.  SH:  Social History   Socioeconomic History   Marital status: Significant Other    Spouse name: Not on file   Number of children: 3   Years of education: 65   Highest education level: 11th grade  Occupational History   Occupation: disabled  Tobacco Use   Smoking status: Every Day    Current packs/day: 0.50    Average packs/day: 0.5 packs/day for 45.0 years (22.5 ttl pk-yrs)    Types: Cigarettes   Smokeless tobacco: Never   Tobacco comments:    currently smoking  10 cigs per day  Vaping Use   Vaping status: Never Used  Substance and Sexual Activity   Alcohol use: Not Currently  Alcohol/week: 3.0 standard drinks of alcohol    Types: 3 Shots of liquor per week   Drug use: No   Sexual activity: Yes    Partners: Male    Birth control/protection: Surgical  Other Topics Concern   Not on file  Social History Narrative   ** Merged History Encounter **       Married   Patient enjoys going to [employer], church and spending time with family   Social Determinants of Health   Financial Resource Strain: Low Risk  (12/09/2022)   Overall Financial Resource Strain (CARDIA)    Difficulty of Paying Living Expenses: Not hard at all  Food Insecurity: No Food Insecurity (12/09/2022)    Hunger Vital Sign    Worried About Running Out of Food in the Last Year: Never true    Ran Out of Food in the Last Year: Never true  Transportation Needs: No Transportation Needs (12/09/2022)   PRAPARE - Administrator, Civil Service (Medical): No    Lack of Transportation (Non-Medical): No  Physical Activity: Insufficiently Active (12/09/2022)   Exercise Vital Sign    Days of Exercise per Week: 2 days    Minutes of Exercise per Session: 10 min  Stress: Stress Concern Present (12/09/2022)   [employer] of Occupational Health - Occupational Stress Questionnaire    Feeling of Stress : To some extent  Social Connections: Moderately Integrated (12/09/2022)   Social Connection and Isolation Panel [NHANES]    Frequency of Communication with Friends and Family: More than three times a week    Frequency of Social Gatherings with Friends and Family: More than three times a week    Attends Religious Services: More than 4 times per year    Active Member of [employer] or Organizations: Yes    Attends Banker Meetings: Never    Marital Status: Widowed  Intimate Partner Violence: Not At Risk (12/09/2022)   Humiliation, Afraid, Rape, and Kick questionnaire    Fear of Current or Ex-Partner: No    Emotionally Abused: No    Physically Abused: No    Sexually Abused: No    FH:  Family History  Problem Relation Age of Onset   Stroke Mother    Seizures Father    Heart disease Father    Diabetes Sister    Asthma Maternal Aunt        aunts   Asthma Maternal Uncle        uncles   Heart disease Paternal Aunt        aunts   Heart disease Paternal Uncle        uncles   Heart disease Maternal Aunt        aunts   Heart disease Maternal Uncle        uncles   Heart disease Maternal Grandfather    Breast cancer Daughter    Breast cancer Cousin    Asthma Grandchild    Colon cancer Neg Hx    Colon polyps Neg Hx    Esophageal cancer Neg Hx    Kidney disease Neg Hx     Gallbladder disease Neg Hx    Past Medical History:  Diagnosis Date   Acute metabolic encephalopathy 07/26/2022   ATN (acute tubular necrosis) (HCC) 07/15/2014   Automatic implantable cardioverter-defibrillator in situ    Automatic implantable cardioverter-defibrillator in situ 05/04/2007   Qualifier: Diagnosis of  By: Levon Hedger     Blood transfusion without reported diagnosis  Cardiac defibrillator in situ    Atlas II VR (SJM) implanted by Dr Ladona Ridgel   CHF (congestive heart failure) (HCC)    Chronic combined systolic and diastolic heart failure (HCC)    a. EF 35-40% in past;  b. Echo 7/13:  EF 45-50%, Gr 2 diast dysfn, mild AI, mild MAC, trivial MR, mild LAE, PASP 47.   Chronic ulcer of leg (HCC)    04-09-15 resolved-not a problem.   Colon polyps 04/12/2013   Rectosigmoid polyp   COPD (chronic obstructive pulmonary disease) (HCC)    O2 at night   Depression    Diabetes mellitus    Diabetes mellitus, type 2 (HCC) 04/03/2012   HIGH RISK FEET.. Please have patient take shoes and socks off every visit for visual foot inspection.     Eczema    Elevated alkaline phosphatase level    GGT and 5'nucleotidase 8/13 normal   Health care maintenance 01/22/2013   Surgically absent cervix- no pap needed (Path report 07/2000: uterine body with attached bilateral  adnexa and separate cervix.)   History of oxygen administration    oxygen @ 2 l/m nasally bedtime 24/7   HTN (hypertension)    Hx of cardiac cath    a. LHC 2003 normal;  b. LHC 6/13:  Mild calcification in the LM, o/w normal coronary arteries, EF 45%.    Hyperkalemia 08/08/2017   Hyperlipidemia    Elevated triglyceride in 2019   Hyperlipidemia associated with type 2 diabetes mellitus (HCC) 05/25/2007   Qualifier: Diagnosis of  By: Daphine Deutscher FNP, [place]     Hyperthyroidism, subclinical    HYPERTHYROIDISM, SUBCLINICAL 05/06/2009   Qualifier: Diagnosis of  By: Flonnie Overman     Implantable cardioverter-defibrillator (ICD)  generator end of life    Lipoma    Need for hepatitis C screening test 04/25/2019   NICM (nonischemic cardiomyopathy) (HCC)    Obesity    On home oxygen therapy    "2L; 24/7" (10/11/2016)   Post menopausal syndrome    Shortness of breath 07/14/2017   Skin rash 07/12/2018   Sleep apnea    pt denies 04/12/2013   Current Outpatient Medications  Medication Sig Dispense Refill   Accu-Chek Softclix Lancets lancets Check BG as directed. 100 each 3   albuterol (VENTOLIN HFA) 108 (90 Base) MCG/ACT inhaler Inhale 2 puffs into the lungs every 6 (six) hours as needed for wheezing or shortness of breath. 8 g 2   amiodarone (PACERONE) 200 MG tablet Take 0.5 tablets (100 mg total) by mouth daily. 45 tablet 2   apixaban (ELIQUIS) 2.5 MG TABS tablet Take 1 tablet (2.5 mg total) by mouth 2 (two) times daily. 60 tablet 2   aspirin EC 81 MG tablet Take 81 mg by mouth daily. Swallow whole.     atorvastatin (LIPITOR) 80 MG tablet Take 80 mg by mouth at bedtime.     calcium-vitamin D (OSCAL WITH D) 500-200 MG-UNIT tablet Take 1 tablet by mouth 2 (two) times daily. 90 tablet 6   dapagliflozin propanediol (FARXIGA) 10 MG TABS tablet Take 1 tablet (10 mg total) by mouth daily. 90 tablet 1   insulin degludec (TRESIBA FLEXTOUCH) 100 UNIT/ML FlexTouch Pen Inject 5 Units into the skin daily.     Insulin Pen Needle (PEN NEEDLES) 32G X 4 MM MISC 1 Needle by Does not apply route daily. 100 each 1   isosorbide-hydrALAZINE (BIDIL) 20-37.5 MG tablet Take 1 tablet by mouth 3 (three) times daily. 90 tablet 3  Lancets Misc. (ACCU-CHEK SOFTCLIX LANCET DEV) KIT Check BG as directed. 1 kit 1   levocetirizine (XYZAL) 5 MG tablet Take 5 mg by mouth at bedtime.     metoprolol succinate (TOPROL-XL) 25 MG 24 hr tablet Take 25 mg by mouth daily.     OZEMPIC, 0.25 OR 0.5 MG/DOSE, 2 MG/3ML SOPN Inject 0.25 mg into the skin every Wednesday.     potassium chloride SA (KLOR-CON M) 20 MEQ tablet Take 1 tablet (20 mEq total) by mouth daily.  90 tablet 3   torsemide (DEMADEX) 20 MG tablet Take 80 mg by mouth 2 (two) times daily.     triamcinolone ointment (KENALOG) 0.1 % Apply 1 Application topically 2 (two) times daily. Mix with Eucerin cream     umeclidinium-vilanterol (ANORO ELLIPTA) 62.5-25 MCG/ACT AEPB Inhale 1 puff into the lungs daily. 60 each 2   No current facility-administered medications for this visit.   There were no vitals taken for this visit.  Wt Readings from Last 3 Encounters:  05/02/23 80.7 kg (177 lb 14.4 oz)  03/16/23 77.7 kg (171 lb 4.8 oz)  03/09/23 76.2 kg (168 lb)   PHYSICAL EXAM: General:  NAD. No resp difficulty, arrived in [facility], placed on 2L oxygen HEENT: Normal Neck: Supple. No JVD. Carotids 2+ bilat; no bruits. No lymphadenopathy or thryomegaly appreciated. Cor: PMI nondisplaced. Regular rate & rhythm. No rubs, gallops or murmurs. Lungs: Faint wheeze upper lobes, very diminished BS Abdomen: Soft, obese, nontender, nondistended. No hepatosplenomegaly. No bruits or masses. Good bowel sounds. Extremities: No cyanosis, clubbing, rash, trace BLE edema R>L Neuro: Alert & oriented x 3, cranial nerves grossly intact. Moves all 4 extremities w/o difficulty. Affect pleasant.  ECG (personally reviewed): NSR 74 bpm  ReDs: 35%  Device interrogation (personally reviewed):  CorVue stable  ASSESSMENT & PLAN: 1. Chronic HFrEF  - NICM LHC 2017 normal cors. - Echo (2022):EF 25-30% which has been down for many years.  - Echo (10/23): EF 25-30%, D shaped LV, RV mildly dilated w/ mod elevated RVSP, RV systolic fx mildly reduced, severe BAE, severe TR, mild-mod MR, IVC dilated, assumed RAP 15  - RHC (11/23): well-compensated filling pressures, normal CO and mild PH. - s/p STJ ICD - NYHA III, Volume up a little, REDs 35%. Suspect COPD main driver of symptoms. - Hold afternoon dose of torsemide and use Furoscix 80 mg SQ x 1 this evening. Will arrange PRN Furoscix for her to have at home. - Continue Farxiga 10 mg  daily. - Continue torsemide 80 mg bid/20 KCL daily. - Continue BiDil 1 tab tid. - Continue Toprol XL 25 mg daily - No spiro or Entresto with CKD  - Labs today.  2. PAF - TEE 9/23- no thrombus noted.   - Back in A flutter 10/23. Chemically converted with IV amio.  - NSR on ECG today, QTC 517 msec. - Decrease amiodarone to 100 mg daily.  - Continue Eliquis 2.5 mg bid. No bleeding issues. - She says she will not wear CPAP, so will not pursue sleep study. - Recent amio labs ok. CBC today.   3. CKD IV - Renal u/s (10/23) no hydronephrosis - Baseline SCr 2-3 - Check BMET  - She has follow up with Dr. Kathrene Bongo soon    4.  DMII - A1c 9.7 (10/23) - On SGLT2i + statin. - Management per PCP.  5. Obesity There is no height or weight on file to calculate BMI. - She is on Ozempic.  6. HTN -  BP much improved. - Continue GDMT as above.   7. Tobacco Abuse/COPD - Lungs sound tight today, may need addition of steroid - She has nebs at home. - Discussed tobacco cessation - Oxygen 84% initially on RA, then 91% on 2L. Needs to wear oxygen all day.  Follow up in 4 months with Dr. Mickle Plumb FNP-BC  8:38 AM   Patient seen and examined with the above-signed Advanced Practice Provider and/or Housestaff. I personally reviewed laboratory data, imaging studies and relevant notes. I independently examined the patient and formulated the important aspects of the plan. I have edited the note to reflect any of my changes or salient points. I have personally discussed the plan with the patient and/or family.  Continues to struggle with volume status. Recently in ER for SOB and volume overload and received IV lasix. Feels better now. ReDS 35% today. Also continues to smoke. Has cough with clear sputum. No fevers or chills. Minimal wheeze. Has home nebulizer  General:  Elderly No resp difficulty HEENT: normal Neck: supple. JVP 6-7 Carotids 2+ bilat; no bruits. No lymphadenopathy  or thryomegaly appreciated. Cor: PMI nondisplaced. Regular rate & rhythm. No rubs, gallops or murmurs. Lungs: coarse Abdomen: obese soft, nontender, nondistended. No hepatosplenomegaly. No bruits or masses. Good bowel sounds. Extremities: no cyanosis, clubbing, rash, trace edema Neuro: alert & orientedx3, cranial nerves grossly intact. moves all 4 extremities w/o difficulty. Affect pleasant  Continues to struggle with volume status. Will giver her som Furoscix to have at home to manage mild decompensations. Stressed need to stop smoking and watch respiratory status closely. If has more wheezing or develops green sputum or fever will need treatment for COPD flare. Check labs. Decreasea amio to 100 daily.  Jacklynn Ganong, FNP  8:38 AM

## 2023-05-08 NOTE — Telephone Encounter (Signed)
Spoke with patient regarding her mammogram appointment 05/11/2023 @ 8:40 am at the breast center/ patient was aware of this appointment. Patient is to call [phone] if she is unable to keep this appointment to cancel or reschedule. Also reminder letter mailed to patient with appointment information.

## 2023-05-08 NOTE — Telephone Encounter (Signed)
Called to confirm/remind patient of their appointment at [facility] on 02/06/23.   Patient reminded to bring all medications and/or complete list.  Confirmed patient has transportation. Gave directions, instructed to utilize valet parking.  Confirmed appointment prior to ending call.

## 2023-05-09 ENCOUNTER — Encounter (HOSPITAL_COMMUNITY): Payer: Self-pay

## 2023-05-09 ENCOUNTER — Ambulatory Visit (HOSPITAL_COMMUNITY)
Admission: RE | Admit: 2023-05-09 | Discharge: 2023-05-09 | Disposition: A | Discharge status: Home / Self Care | Payer: 59 | Source: Ambulatory Visit | Attending: Family Medicine | Admitting: Family Medicine

## 2023-05-09 VITALS — BP 108/60 | HR 78 | Wt 178.0 lb

## 2023-05-09 DIAGNOSIS — I428 Other cardiomyopathies: Secondary | ICD-10-CM | POA: Diagnosis not present

## 2023-05-09 DIAGNOSIS — Z95810 Presence of automatic (implantable) cardiac defibrillator: Secondary | ICD-10-CM | POA: Insufficient documentation

## 2023-05-09 DIAGNOSIS — I10 Essential (primary) hypertension: Secondary | ICD-10-CM

## 2023-05-09 DIAGNOSIS — E119 Type 2 diabetes mellitus without complications: Secondary | ICD-10-CM

## 2023-05-09 DIAGNOSIS — J9610 Chronic respiratory failure, unspecified whether with hypoxia or hypercapnia: Secondary | ICD-10-CM | POA: Diagnosis not present

## 2023-05-09 DIAGNOSIS — I5022 Chronic systolic (congestive) heart failure: Secondary | ICD-10-CM

## 2023-05-09 DIAGNOSIS — Z7901 Long term (current) use of anticoagulants: Secondary | ICD-10-CM | POA: Diagnosis not present

## 2023-05-09 DIAGNOSIS — N184 Chronic kidney disease, stage 4 (severe): Secondary | ICD-10-CM | POA: Insufficient documentation

## 2023-05-09 DIAGNOSIS — Z79899 Other long term (current) drug therapy: Secondary | ICD-10-CM | POA: Insufficient documentation

## 2023-05-09 DIAGNOSIS — E669 Obesity, unspecified: Secondary | ICD-10-CM

## 2023-05-09 DIAGNOSIS — J449 Chronic obstructive pulmonary disease, unspecified: Secondary | ICD-10-CM | POA: Diagnosis not present

## 2023-05-09 DIAGNOSIS — I480 Paroxysmal atrial fibrillation: Secondary | ICD-10-CM | POA: Diagnosis not present

## 2023-05-09 DIAGNOSIS — E1122 Type 2 diabetes mellitus with diabetic chronic kidney disease: Secondary | ICD-10-CM | POA: Diagnosis not present

## 2023-05-09 DIAGNOSIS — Z7984 Long term (current) use of oral hypoglycemic drugs: Secondary | ICD-10-CM | POA: Insufficient documentation

## 2023-05-09 DIAGNOSIS — Z8249 Family history of ischemic heart disease and other diseases of the circulatory system: Secondary | ICD-10-CM | POA: Insufficient documentation

## 2023-05-09 DIAGNOSIS — I4892 Unspecified atrial flutter: Secondary | ICD-10-CM | POA: Diagnosis not present

## 2023-05-09 DIAGNOSIS — Z794 Long term (current) use of insulin: Secondary | ICD-10-CM

## 2023-05-09 DIAGNOSIS — I130 Hypertensive heart and chronic kidney disease with heart failure and stage 1 through stage 4 chronic kidney disease, or unspecified chronic kidney disease: Secondary | ICD-10-CM | POA: Diagnosis not present

## 2023-05-09 DIAGNOSIS — Z87891 Personal history of nicotine dependence: Secondary | ICD-10-CM | POA: Insufficient documentation

## 2023-05-09 DIAGNOSIS — Z6835 Body mass index (BMI) 35.0-35.9, adult: Secondary | ICD-10-CM | POA: Insufficient documentation

## 2023-05-09 NOTE — Progress Notes (Signed)
ReDS Vest / Clip - 05/09/23 1500       ReDS Vest / Clip   Station Marker A    Ruler Value 29    ReDS Value Range Low volume    ReDS Actual Value 35

## 2023-05-09 NOTE — Patient Instructions (Signed)
Wednesday HOLD Torsemide in the morning, use a Furoscix Kit with 40 mEq ( 2 Tab) Potassium , Wednesday evening take 80 mg of Torsemide with 20 mEq ( 1 Tab) of potassium   THURSDAY- HOLD Torsemide in the morning, use a Furoscix Kit with 40 mEq ( 2 Tab) of Potassium, Thursday evening take 80 mg of Torsemide with 20 mEq ( 1 Tab) potassium.  Blood work  in 7 days.  Keep follow up with Dr. Gala Romney as scheduled.  If you have any questions or concerns before your next appointment please send [place] a message through [place] or call our office at [phone].    TO LEAVE A MESSAGE FOR THE NURSE SELECT OPTION 2, PLEASE LEAVE A MESSAGE INCLUDING: YOUR NAME DATE OF BIRTH CALL BACK NUMBER REASON FOR CALL**this is important as we prioritize the call backs  YOU WILL RECEIVE A CALL BACK THE SAME DAY AS LONG AS YOU CALL BEFORE 4:00 PM  At the [facility], you and your health needs are our priority. As part of our continuing mission to provide you with exceptional heart care, we have created designated Provider Care Teams. These Care Teams include your primary Cardiologist (physician) and Advanced Practice Providers (APPs- Physician Assistants and Nurse Practitioners) who all work together to provide you with the care you need, when you need it.   You may see any of the following providers on your designated Care Team at your next follow up: Dr Arvilla Meres Dr Marca Ancona Dr. Marcos Eke, NP Robbie Lis, [place] [facility] [place], [place] Brynda Peon, NP Karle Plumber, PharmD   Please be sure to bring in all your medications bottles to every appointment.    Thank you for choosing [facility]

## 2023-05-11 ENCOUNTER — Telehealth (HOSPITAL_COMMUNITY): Payer: Self-pay | Admitting: Cardiology

## 2023-05-11 ENCOUNTER — Ambulatory Visit
Admission: RE | Admit: 2023-05-11 | Discharge: 2023-05-11 | Disposition: A | Discharge status: Home / Self Care | Payer: 59 | Source: Ambulatory Visit | Attending: Internal Medicine | Admitting: Internal Medicine

## 2023-05-11 DIAGNOSIS — Z1231 Encounter for screening mammogram for malignant neoplasm of breast: Secondary | ICD-10-CM

## 2023-05-11 NOTE — Telephone Encounter (Signed)
[name] with [facility] [phone] Called for fluid restrictions  Verbal given for overall hf instructions/education   Do the following things EVERYDAY: Weigh yourself in the morning before breakfast. Write it down and keep it in a log. Take your medicines as prescribed Eat low salt foods--Limit salt (sodium) to 2000 mg per day.  Stay as active as you can everyday Limit all fluids for the day to less than 2 liters

## 2023-05-16 ENCOUNTER — Other Ambulatory Visit (HOSPITAL_COMMUNITY): Payer: 59

## 2023-05-22 ENCOUNTER — Telehealth: Payer: Self-pay | Admitting: Internal Medicine

## 2023-05-22 NOTE — Telephone Encounter (Signed)
Patient letter completed for jury duty exemption.

## 2023-05-26 ENCOUNTER — Ambulatory Visit (HOSPITAL_COMMUNITY)
Admission: RE | Admit: 2023-05-26 | Discharge: 2023-05-26 | Disposition: A | Discharge status: Home / Self Care | Payer: 59 | Source: Ambulatory Visit | Attending: Family Medicine | Admitting: Family Medicine

## 2023-05-26 DIAGNOSIS — I5022 Chronic systolic (congestive) heart failure: Secondary | ICD-10-CM | POA: Diagnosis present

## 2023-05-26 LAB — BASIC METABOLIC PANEL
Anion gap: 9 (ref 5–15)
BUN: 43 mg/dL — ABNORMAL HIGH (ref 8–23)
CO2: 32 mmol/L (ref 22–32)
Calcium: 8.8 mg/dL — ABNORMAL LOW (ref 8.9–10.3)
Chloride: 97 mmol/L — ABNORMAL LOW (ref 98–111)
Creatinine, Ser: 1.59 mg/dL — ABNORMAL HIGH (ref 0.44–1.00)
GFR, Estimated: 35 mL/min — ABNORMAL LOW (ref >60)
Glucose, Bld: 160 mg/dL — ABNORMAL HIGH (ref 70–99)
Potassium: 4.2 mmol/L (ref 3.5–5.1)
Sodium: 138 mmol/L (ref 135–145)

## 2023-05-26 LAB — MAGNESIUM: Magnesium: 1.8 mg/dL (ref 1.7–2.4)

## 2023-05-26 LAB — BRAIN NATRIURETIC PEPTIDE: B Natriuretic Peptide: 41.2 pg/mL (ref 0.0–100.0)

## 2023-06-13 NOTE — Progress Notes (Signed)
ADVANCED HF CLINIC NOTE   PCP: Kathleen Lime, MD HF Cardiologist:  Dr Gala Romney   HPI: [name] is a 72 y.o. with a history of DM2, COPD, HTN, chronic respiratory failure, tobacco abuse, chronic systolic s/p St Jude ICD, EF has 25-30% for at least 5 years. Last LHC 2017, normal coronaries.    Admitted 9/23 with sepsis 2/2 cellulitis of RLE. Underwent TEE to r/o endocarditis. TEE (9/23) showed LVEF 30%, moderate TR, no valvular vegetation or device vegetation. Admitted a couple weeks later with hypothermia and hypoglycemia. Insulin stopped and SGLT2i continued. Entresto and spiro held due to low BP.   Admitted 10/23 with A/C HFrEF, AECOPD, and new onset A flutter RVR. Given steroinds + inhalers. Started on amio drip + heparin drip. Diuresed with IV lasix.  Hospital course complicated by AKI. Renal US negative for hydronephrosis. Had RHC prior to discharge. Low filing pressures and CI 2.5. Discharge weight 197 pounds.   Seen in ED 03/09/23 with SOB. BNP normal, felt to be COPD exac, improved after duonebs.  Seen in clinic in 8/24. Volume overloaded. Furoscix prescribed. Diuresed well.   Here for f/u. Feels much better. Breathing better. Can do ADLs without too much problem. No orthopnea or PND. Taking torsemide 80 bid. Drinking A LOT of water. Says she quit smoking 1 month ago    Cardiac Testing  - RHC (11/23):  RA = 9 RV = 55/10 PA = 44/11 (23) PCW = 9 Fick cardiac output/index = 6.1/3.2 Thermo CO/CI = 4.8/2.5 PVR = 2.3 (Fick) 2.9 (TD) Ao sat = 95% PA sat = 67%, 67% PAPi = 5.0  - Echo (10/23): EF 25-30%, severe LV dysfunction, RV low/normal, mild to moderate MR with moderate MAC, severe TR  - TEE (9/23): EF 30%, moderate TR, no valvular vegetation or device vegetation.    - Echo (10/23): EF 25-30% RV low normal. RA/LA severely dilated. D Sahped septum  - Echo (9/22): EF 20-25% RV normal , LA severely reduced   - Echo (2018): EF 25-30% Grade IDD  - LHC 2017 Normal  Coronaries.    ROS: All systems negative except as listed in HPI, PMH and Problem List.  SH:  Social History   Socioeconomic History   Marital status: Significant Other    Spouse name: Not on file   Number of children: 3   Years of education: 11   Highest education level: 11th grade  Occupational History   Occupation: disabled  Tobacco Use   Smoking status: Every Day    Current packs/day: 0.50    Average packs/day: 0.5 packs/day for 45.0 years (22.5 ttl pk-yrs)    Types: Cigarettes   Smokeless tobacco: Never   Tobacco comments:    currently smoking  10 cigs per day  Vaping Use   Vaping status: Never Used  Substance and Sexual Activity   Alcohol use: Not Currently    Alcohol/week: 3.0 standard drinks of alcohol    Types: 3 Shots of liquor per week   Drug use: No   Sexual activity: Yes    Partners: Male    Birth control/protection: Surgical  Other Topics Concern   Not on file  Social History Narrative   ** Merged History Encounter **       Married   Patient enjoys going to [employer], church and spending time with family   Social Determinants of Health   Financial Resource Strain: Low Risk  (12/09/2022)   Overall Financial Resource Strain (CARDIA)  Difficulty of Paying Living Expenses: Not hard at all  Food Insecurity: No Food Insecurity (12/09/2022)   Hunger Vital Sign    Worried About Running Out of Food in the Last Year: Never true    Ran Out of Food in the Last Year: Never true  Transportation Needs: No Transportation Needs (12/09/2022)   PRAPARE - Administrator, Civil Service (Medical): No    Lack of Transportation (Non-Medical): No  Physical Activity: Insufficiently Active (12/09/2022)   Exercise Vital Sign    Days of Exercise per Week: 2 days    Minutes of Exercise per Session: 10 min  Stress: Stress Concern Present (12/09/2022)   [employer] of Occupational Health - Occupational Stress Questionnaire    Feeling of Stress : To some extent   Social Connections: Moderately Integrated (12/09/2022)   Social Connection and Isolation Panel [NHANES]    Frequency of Communication with Friends and Family: More than three times a week    Frequency of Social Gatherings with Friends and Family: More than three times a week    Attends Religious Services: More than 4 times per year    Active Member of [employer] or Organizations: Yes    Attends Banker Meetings: Never    Marital Status: Widowed  Intimate Partner Violence: Not At Risk (12/09/2022)   Humiliation, Afraid, Rape, and Kick questionnaire    Fear of Current or Ex-Partner: No    Emotionally Abused: No    Physically Abused: No    Sexually Abused: No   FH:  Family History  Problem Relation Age of Onset   Stroke Mother    Seizures Father    Heart disease Father    Diabetes Sister    Asthma Maternal Aunt        aunts   Asthma Maternal Uncle        uncles   Heart disease Paternal Aunt        aunts   Heart disease Paternal Uncle        uncles   Heart disease Maternal Aunt        aunts   Heart disease Maternal Uncle        uncles   Heart disease Maternal Grandfather    Breast cancer Daughter    Breast cancer Cousin    Asthma Grandchild    Colon cancer Neg Hx    Colon polyps Neg Hx    Esophageal cancer Neg Hx    Kidney disease Neg Hx    Gallbladder disease Neg Hx    Past Medical History:  Diagnosis Date   Acute metabolic encephalopathy 07/26/2022   ATN (acute tubular necrosis) (HCC) 07/15/2014   Automatic implantable cardioverter-defibrillator in situ    Automatic implantable cardioverter-defibrillator in situ 05/04/2007   Qualifier: Diagnosis of  By: Levon Hedger     Blood transfusion without reported diagnosis    Cardiac defibrillator in situ    Atlas II VR (SJM) implanted by Dr Ladona Ridgel   CHF (congestive heart failure) (HCC)    Chronic combined systolic and diastolic heart failure (HCC)    a. EF 35-40% in past;  b. Echo 7/13:  EF 45-50%, Gr 2 diast  dysfn, mild AI, mild MAC, trivial MR, mild LAE, PASP 47.   Chronic ulcer of leg (HCC)    04-09-15 resolved-not a problem.   Colon polyps 04/12/2013   Rectosigmoid polyp   COPD (chronic obstructive pulmonary disease) (HCC)    O2 at night   Depression  Diabetes mellitus    Diabetes mellitus, type 2 (HCC) 04/03/2012   HIGH RISK FEET.. Please have patient take shoes and socks off every visit for visual foot inspection.     Eczema    Elevated alkaline phosphatase level    GGT and 5'nucleotidase 8/13 normal   Health care maintenance 01/22/2013   Surgically absent cervix- no pap needed (Path report 07/2000: uterine body with attached bilateral  adnexa and separate cervix.)   History of oxygen administration    oxygen @ 2 l/m nasally bedtime 24/7   HTN (hypertension)    Hx of cardiac cath    a. LHC 2003 normal;  b. LHC 6/13:  Mild calcification in the LM, o/w normal coronary arteries, EF 45%.    Hyperkalemia 08/08/2017   Hyperlipidemia    Elevated triglyceride in 2019   Hyperlipidemia associated with type 2 diabetes mellitus (HCC) 05/25/2007   Qualifier: Diagnosis of  By: Daphine Deutscher FNP, Nykedtra     Hyperthyroidism, subclinical    HYPERTHYROIDISM, SUBCLINICAL 05/06/2009   Qualifier: Diagnosis of  By: Flonnie Overman     Implantable cardioverter-defibrillator (ICD) generator end of life    Lipoma    Need for hepatitis C screening test 04/25/2019   NICM (nonischemic cardiomyopathy) (HCC)    Obesity    On home oxygen therapy    "2L; 24/7" (10/11/2016)   Post menopausal syndrome    Shortness of breath 07/14/2017   Skin rash 07/12/2018   Sleep apnea    pt denies 04/12/2013   Current Outpatient Medications  Medication Sig Dispense Refill   Accu-Chek Softclix Lancets lancets Check BG as directed. 100 each 3   albuterol (VENTOLIN HFA) 108 (90 Base) MCG/ACT inhaler Inhale 2 puffs into the lungs every 6 (six) hours as needed for wheezing or shortness of breath. 8 g 2   amiodarone (PACERONE) 200  MG tablet Take 0.5 tablets (100 mg total) by mouth daily. 45 tablet 2   apixaban (ELIQUIS) 2.5 MG TABS tablet Take 1 tablet (2.5 mg total) by mouth 2 (two) times daily. 60 tablet 2   aspirin EC 81 MG tablet Take 81 mg by mouth daily. Swallow whole.     atorvastatin (LIPITOR) 80 MG tablet Take 80 mg by mouth at bedtime.     calcium-vitamin D (OSCAL WITH D) 500-200 MG-UNIT tablet Take 1 tablet by mouth 2 (two) times daily. 90 tablet 6   dapagliflozin propanediol (FARXIGA) 10 MG TABS tablet Take 1 tablet (10 mg total) by mouth daily. 90 tablet 1   insulin degludec (TRESIBA FLEXTOUCH) 100 UNIT/ML FlexTouch Pen Inject 5 Units into the skin daily.     Insulin Pen Needle (PEN NEEDLES) 32G X 4 MM MISC 1 Needle by Does not apply route daily. 100 each 1   isosorbide-hydrALAZINE (BIDIL) 20-37.5 MG tablet Take 1 tablet by mouth 3 (three) times daily. 90 tablet 3   Lancets Misc. (ACCU-CHEK SOFTCLIX LANCET DEV) KIT Check BG as directed. 1 kit 1   levocetirizine (XYZAL) 5 MG tablet Take 5 mg by mouth at bedtime.     metoprolol succinate (TOPROL-XL) 25 MG 24 hr tablet Take 25 mg by mouth daily.     OZEMPIC, 0.25 OR 0.5 MG/DOSE, 2 MG/3ML SOPN Inject 0.25 mg into the skin every Wednesday.     potassium chloride SA (KLOR-CON M) 20 MEQ tablet Take 1 tablet (20 mEq total) by mouth daily. 90 tablet 3   torsemide (DEMADEX) 20 MG tablet Take 80 mg by mouth 2 (two) times  daily.     triamcinolone ointment (KENALOG) 0.1 % Apply 1 Application topically 2 (two) times daily. Mix with Eucerin cream     umeclidinium-vilanterol (ANORO ELLIPTA) 62.5-25 MCG/ACT AEPB Inhale 1 puff into the lungs daily. 60 each 2   No current facility-administered medications for this encounter.   BP 122/70   Pulse 90   Wt 78.2 kg (172 lb 6.4 oz)   SpO2 91%   BMI 34.82 kg/m   Wt Readings from Last 3 Encounters:  06/14/23 78.2 kg (172 lb 6.4 oz)  05/09/23 80.7 kg (178 lb)  05/02/23 80.7 kg (177 lb 14.4 oz)   PHYSICAL EXAM: General:   Arrived in WC, NAD. Placed on 3L [place] HEENT: Normal Neck: Supple. No JVD. Carotids 2+ bilat; no bruits. No lymphadenopathy or thryomegaly appreciated. Cor: PMI nondisplaced. Regular rate & rhythm. No rubs, gallops or murmurs. Lungs: Wheezes upper lobes Abdomen: Soft, nontender, nondistended. No hepatosplenomegaly. No bruits or masses. Good bowel sounds. Extremities: No cyanosis, clubbing, rash, 1+ BLE edema Neuro: Alert & oriented x 3, cranial nerves grossly intact. Moves all 4 extremities w/o difficulty. Affect pleasant.  Device interrogation (personally reviewed): No VT/AF. Volume ok. Activity level < 1hr/day. Personally reviewed  ASSESSMENT & PLAN:  1. Chronic HFrEF  - NICM LHC 2017 normal cors. - Echo (2022):EF 25-30% which has been down for many years.  - Echo (10/23): EF 25-30%, D shaped LV, RV mildly dilated w/ mod elevated RVSP, RV systolic fx mildly reduced, severe BAE, severe TR, mild-mod MR, IVC dilated, assumed RAP 15  - RHC (11/23): well-compensated filling pressures, normal CO and mild PH. - s/p STJ ICD - Improved NYHA III,Suspect COPD main driver of symptoms. - Volume status improved. Continue torsemide 80 mg bid + 20 KCL daily. Discussed need to limit fluid intake  - Continue Farxiga 10 mg daily. - Continue BiDil 1 tab tid. - Continue Toprol XL 25 mg daily - Has not been on MRA or ARNI due to CKD but SCr running 1.4-1.6 so I think we can start. Start spiro 12.5 daily  - Consider ARB/ARNI at next visit.  - Labs today and 1 week   2. PAF - In NSR today. No AF on device.  - Continue low dose amio 100 mg daily.  - Continue Eliquis 2.5 mg bid. No bleeding issues. - She says she will not wear CPAP, so will not pursue sleep study.   3. CKD IV - Renal u/s (10/23) no hydronephrosis - Baseline SCr 2-3 but more recently 1.4-1.6 - Continue Farxiga - Labs today     4.  DMII - A1c 9.7 (10/23) - On SGLT2i + statin. - Management per PCP.  5. Obesity Body mass index is  34.82 kg/m. - She is on Ozempic.  6. HTN - BP much improved. - Continue GDMT as above.   7. Tobacco Abuse/COPD - Remains quit - Congratulated her.    Arvilla Meres MD 10:05 AM   \

## 2023-06-14 ENCOUNTER — Encounter (HOSPITAL_COMMUNITY): Payer: Self-pay | Admitting: Internal Medicine

## 2023-06-14 ENCOUNTER — Ambulatory Visit (HOSPITAL_COMMUNITY)
Admission: RE | Admit: 2023-06-14 | Discharge: 2023-06-14 | Disposition: A | Discharge status: Home / Self Care | Payer: 59 | Source: Ambulatory Visit | Attending: Internal Medicine | Admitting: Internal Medicine

## 2023-06-14 VITALS — BP 122/70 | HR 90 | Wt 172.4 lb

## 2023-06-14 DIAGNOSIS — N184 Chronic kidney disease, stage 4 (severe): Secondary | ICD-10-CM | POA: Insufficient documentation

## 2023-06-14 DIAGNOSIS — I130 Hypertensive heart and chronic kidney disease with heart failure and stage 1 through stage 4 chronic kidney disease, or unspecified chronic kidney disease: Secondary | ICD-10-CM | POA: Diagnosis not present

## 2023-06-14 DIAGNOSIS — E669 Obesity, unspecified: Secondary | ICD-10-CM | POA: Diagnosis not present

## 2023-06-14 DIAGNOSIS — J9610 Chronic respiratory failure, unspecified whether with hypoxia or hypercapnia: Secondary | ICD-10-CM | POA: Insufficient documentation

## 2023-06-14 DIAGNOSIS — I428 Other cardiomyopathies: Secondary | ICD-10-CM | POA: Diagnosis not present

## 2023-06-14 DIAGNOSIS — Z6834 Body mass index (BMI) 34.0-34.9, adult: Secondary | ICD-10-CM | POA: Diagnosis not present

## 2023-06-14 DIAGNOSIS — I10 Essential (primary) hypertension: Secondary | ICD-10-CM

## 2023-06-14 DIAGNOSIS — Z7901 Long term (current) use of anticoagulants: Secondary | ICD-10-CM | POA: Insufficient documentation

## 2023-06-14 DIAGNOSIS — Z95810 Presence of automatic (implantable) cardiac defibrillator: Secondary | ICD-10-CM | POA: Diagnosis not present

## 2023-06-14 DIAGNOSIS — Z87891 Personal history of nicotine dependence: Secondary | ICD-10-CM | POA: Diagnosis not present

## 2023-06-14 DIAGNOSIS — I480 Paroxysmal atrial fibrillation: Secondary | ICD-10-CM | POA: Diagnosis not present

## 2023-06-14 DIAGNOSIS — Z720 Tobacco use: Secondary | ICD-10-CM

## 2023-06-14 DIAGNOSIS — I5022 Chronic systolic (congestive) heart failure: Secondary | ICD-10-CM | POA: Insufficient documentation

## 2023-06-14 DIAGNOSIS — J449 Chronic obstructive pulmonary disease, unspecified: Secondary | ICD-10-CM | POA: Insufficient documentation

## 2023-06-14 DIAGNOSIS — E1122 Type 2 diabetes mellitus with diabetic chronic kidney disease: Secondary | ICD-10-CM | POA: Diagnosis not present

## 2023-06-14 LAB — BRAIN NATRIURETIC PEPTIDE: B Natriuretic Peptide: 16.3 pg/mL (ref 0.0–100.0)

## 2023-06-14 LAB — BASIC METABOLIC PANEL
Anion gap: 11 (ref 5–15)
BUN: 51 mg/dL — ABNORMAL HIGH (ref 8–23)
CO2: 31 mmol/L (ref 22–32)
Calcium: 9.1 mg/dL (ref 8.9–10.3)
Chloride: 98 mmol/L (ref 98–111)
Creatinine, Ser: 1.89 mg/dL — ABNORMAL HIGH (ref 0.44–1.00)
GFR, Estimated: 28 mL/min — ABNORMAL LOW (ref >60)
Glucose, Bld: 245 mg/dL — ABNORMAL HIGH (ref 70–99)
Potassium: 3.8 mmol/L (ref 3.5–5.1)
Sodium: 140 mmol/L (ref 135–145)

## 2023-06-14 MED ORDER — SPIRONOLACTONE 25 MG PO TABS: 12.5000 mg | ORAL_TABLET | Freq: Every day | ORAL | 3 refills | Status: DC

## 2023-06-14 NOTE — Patient Instructions (Addendum)
Medication Changes:  START: SPIRONOLACTONE 12.5MG  ONCE DAILY   Lab Work:  Labs done today, your results will be available in MyChart, we will contact you for abnormal readings.  THEN RETURN IN 1 WEEK FOR REPEAT BLOOD WORK AS SCHEDULED   Follow-Up in: 2 MONTHS WITH APP AS SCHEDULED   At the [facility], you and your health needs are our priority. We have a designated team specialized in the treatment of Heart Failure. This Care Team includes your primary Heart Failure Specialized Cardiologist (physician), Advanced Practice Providers (APPs- Physician Assistants and Nurse Practitioners), and Pharmacist who all work together to provide you with the care you need, when you need it.   You may see any of the following providers on your designated Care Team at your next follow up:  Dr. Arvilla Meres Dr. Marca Ancona Dr. Marcos Eke, NP Robbie Lis, [place] [facility] [place], [place] Brynda Peon, NP Karle Plumber, PharmD   Please be sure to bring in all your medications bottles to every appointment.   Need to Contact us:  If you have any questions or concerns before your next appointment please send [place] a message through [place] or call our office at [phone].    TO LEAVE A MESSAGE FOR THE NURSE SELECT OPTION 2, PLEASE LEAVE A MESSAGE INCLUDING: YOUR NAME DATE OF BIRTH CALL BACK NUMBER REASON FOR CALL**this is important as we prioritize the call backs  YOU WILL RECEIVE A CALL BACK THE SAME DAY AS LONG AS YOU CALL BEFORE 4:00 PM

## 2023-06-21 ENCOUNTER — Ambulatory Visit (HOSPITAL_COMMUNITY)
Admission: RE | Admit: 2023-06-21 | Discharge: 2023-06-21 | Disposition: A | Discharge status: Home / Self Care | Payer: 59 | Source: Ambulatory Visit | Attending: Cardiology | Admitting: Cardiology

## 2023-06-21 DIAGNOSIS — I5022 Chronic systolic (congestive) heart failure: Secondary | ICD-10-CM | POA: Diagnosis present

## 2023-06-21 LAB — BASIC METABOLIC PANEL WITH GFR
Anion gap: 9 (ref 5–15)
BUN: 42 mg/dL — ABNORMAL HIGH (ref 8–23)
CO2: 31 mmol/L (ref 22–32)
Calcium: 9.1 mg/dL (ref 8.9–10.3)
Chloride: 96 mmol/L — ABNORMAL LOW (ref 98–111)
Creatinine, Ser: 1.9 mg/dL — ABNORMAL HIGH (ref 0.44–1.00)
GFR, Estimated: 28 mL/min — ABNORMAL LOW (ref >60)
Glucose, Bld: 325 mg/dL — ABNORMAL HIGH (ref 70–99)
Potassium: 4.3 mmol/L (ref 3.5–5.1)
Sodium: 136 mmol/L (ref 135–145)

## 2023-06-22 ENCOUNTER — Encounter: Payer: Self-pay | Admitting: Internal Medicine

## 2023-06-22 ENCOUNTER — Telehealth: Payer: Self-pay | Admitting: *Deleted

## 2023-06-22 NOTE — Telephone Encounter (Signed)
Call from [place] ,[facility] .  Wanted to inform doctor that patient had some wheezing this morning.  Her O2 Concentrator has been broken and someone is coming out today to fix for patient.  Wheezinng is no more that usual Has been using her inhaler.  Oxygen level is 95%. Feels ok.  Feels she is in distress.  No shortness of breath. Just wanted doctor to be aware.

## 2023-06-26 ENCOUNTER — Other Ambulatory Visit: Payer: Self-pay | Admitting: Dietician

## 2023-06-26 DIAGNOSIS — E1165 Type 2 diabetes mellitus with hyperglycemia: Secondary | ICD-10-CM

## 2023-06-26 MED ORDER — DEXCOM G7 RECEIVER DEVI
0 refills | Status: DC
Start: 2023-06-26 — End: 2024-04-13

## 2023-06-26 MED ORDER — DEXCOM G7 SENSOR MISC
3 refills | Status: DC
Start: 2023-06-26 — End: 2024-03-25

## 2023-06-26 NOTE — Telephone Encounter (Signed)
Called patient because we received paper work from [place] med mail order for her CGM supplies. This had been ordered at [facility] in 03/2022 but patient says she never got it. She would like another prescription to be sent to [facility], she says she remembers how to use it and if she has difficulty after receiving the CGM supplies that she will call for help.

## 2023-07-14 ENCOUNTER — Encounter (HOSPITAL_COMMUNITY): Payer: Self-pay | Admitting: Gastroenterology

## 2023-07-14 NOTE — Progress Notes (Signed)
Attempted to obtain medical history for pre op call via telephone, unable to reach at this time. HIPAA compliant voicemail message left requesting return call to pre surgical testing department.

## 2023-07-20 ENCOUNTER — Other Ambulatory Visit: Payer: Self-pay

## 2023-07-20 DIAGNOSIS — J984 Other disorders of lung: Secondary | ICD-10-CM

## 2023-07-20 MED ORDER — ALBUTEROL SULFATE HFA 108 (90 BASE) MCG/ACT IN AERS: 2.0000 | INHALATION_SPRAY | Freq: Four times a day (QID) | RESPIRATORY_TRACT | 2 refills | Status: DC | PRN

## 2023-07-20 MED ORDER — ANORO ELLIPTA 62.5-25 MCG/ACT IN AEPB: 1.0000 | INHALATION_SPRAY | Freq: Every day | RESPIRATORY_TRACT | 2 refills | Status: DC

## 2023-07-21 ENCOUNTER — Ambulatory Visit (HOSPITAL_COMMUNITY)
Admission: RE | Admit: 2023-07-21 | Discharge: 2023-07-21 | Disposition: A | Discharge status: Home / Self Care | Payer: 59 | Source: Ambulatory Visit | Attending: Gastroenterology | Admitting: Gastroenterology

## 2023-07-21 ENCOUNTER — Encounter (HOSPITAL_COMMUNITY): Payer: 59 | Admitting: Anesthesiology

## 2023-07-21 ENCOUNTER — Other Ambulatory Visit: Payer: Self-pay

## 2023-07-21 ENCOUNTER — Encounter (HOSPITAL_COMMUNITY)
Admission: RE | Disposition: A | Discharge status: Home / Self Care | Payer: Self-pay | Source: Ambulatory Visit | Attending: Gastroenterology

## 2023-07-21 ENCOUNTER — Encounter (HOSPITAL_COMMUNITY): Payer: Self-pay | Admitting: Anesthesiology

## 2023-07-21 DIAGNOSIS — Z860100 Personal history of colon polyps, unspecified: Secondary | ICD-10-CM | POA: Diagnosis not present

## 2023-07-21 DIAGNOSIS — G4730 Sleep apnea, unspecified: Secondary | ICD-10-CM | POA: Insufficient documentation

## 2023-07-21 DIAGNOSIS — I5042 Chronic combined systolic (congestive) and diastolic (congestive) heart failure: Secondary | ICD-10-CM | POA: Diagnosis not present

## 2023-07-21 DIAGNOSIS — I4891 Unspecified atrial fibrillation: Secondary | ICD-10-CM | POA: Diagnosis not present

## 2023-07-21 DIAGNOSIS — E669 Obesity, unspecified: Secondary | ICD-10-CM | POA: Insufficient documentation

## 2023-07-21 DIAGNOSIS — E785 Hyperlipidemia, unspecified: Secondary | ICD-10-CM | POA: Insufficient documentation

## 2023-07-21 DIAGNOSIS — J449 Chronic obstructive pulmonary disease, unspecified: Secondary | ICD-10-CM | POA: Diagnosis not present

## 2023-07-21 DIAGNOSIS — I110 Hypertensive heart disease with heart failure: Secondary | ICD-10-CM | POA: Insufficient documentation

## 2023-07-21 DIAGNOSIS — Z1211 Encounter for screening for malignant neoplasm of colon: Secondary | ICD-10-CM | POA: Insufficient documentation

## 2023-07-21 DIAGNOSIS — F17210 Nicotine dependence, cigarettes, uncomplicated: Secondary | ICD-10-CM | POA: Diagnosis not present

## 2023-07-21 DIAGNOSIS — Z9981 Dependence on supplemental oxygen: Secondary | ICD-10-CM | POA: Diagnosis not present

## 2023-07-21 DIAGNOSIS — Z95810 Presence of automatic (implantable) cardiac defibrillator: Secondary | ICD-10-CM | POA: Insufficient documentation

## 2023-07-21 DIAGNOSIS — E119 Type 2 diabetes mellitus without complications: Secondary | ICD-10-CM | POA: Insufficient documentation

## 2023-07-21 HISTORY — PX: COLONOSCOPY WITH PROPOFOL: SHX5780

## 2023-07-21 LAB — GLUCOSE, CAPILLARY: Glucose-Capillary: 110 mg/dL — ABNORMAL HIGH (ref 70–99)

## 2023-07-21 SURGERY — COLONOSCOPY WITH PROPOFOL
Anesthesia: Monitor Anesthesia Care

## 2023-07-21 MED ORDER — PROPOFOL 500 MG/50ML IV EMUL
INTRAVENOUS | Status: DC | PRN
  Administered 2023-07-21: 110 ug/kg/min via INTRAVENOUS

## 2023-07-21 MED ORDER — PROPOFOL 10 MG/ML IV BOLUS
INTRAVENOUS | Status: DC | PRN
  Administered 2023-07-21: 20 mg via INTRAVENOUS

## 2023-07-21 MED ORDER — PROPOFOL 1000 MG/100ML IV EMUL
INTRAVENOUS | Status: AC
  Filled 2023-07-21: qty 100

## 2023-07-21 MED ORDER — SODIUM CHLORIDE 0.9 % IV SOLN: INTRAVENOUS | Status: DC

## 2023-07-21 SURGICAL SUPPLY — 22 items
ELECT REM PT RETURN 9FT ADLT (ELECTROSURGICAL)
ELECTRODE REM PT RTRN 9FT ADLT (ELECTROSURGICAL) IMPLANT
FCP BXJMBJMB 240X2.8X (CUTTING FORCEPS)
FLOOR PAD 36X40 (MISCELLANEOUS) ×1
FORCEPS BIOP RAD 4 LRG CAP 4 (CUTTING FORCEPS) IMPLANT
FORCEPS BIOP RJ4 240 W/NDL (CUTTING FORCEPS)
FORCEPS BXJMBJMB 240X2.8X (CUTTING FORCEPS) IMPLANT
INJECTOR/SNARE I SNARE (MISCELLANEOUS) IMPLANT
LUBRICANT JELLY 4.5OZ STERILE (MISCELLANEOUS) IMPLANT
MANIFOLD NEPTUNE II (INSTRUMENTS) IMPLANT
NDL SCLEROTHERAPY 25GX240 (NEEDLE) IMPLANT
NEEDLE SCLEROTHERAPY 25GX240 (NEEDLE)
PAD FLOOR 36X40 (MISCELLANEOUS) ×1 IMPLANT
PROBE APC STR FIRE (PROBE) IMPLANT
PROBE INJECTION GOLD (MISCELLANEOUS)
PROBE INJECTION GOLD 7FR (MISCELLANEOUS) IMPLANT
SNARE ROTATE MED OVAL 20MM (MISCELLANEOUS) IMPLANT
SYR 50ML LL SCALE MARK (SYRINGE) IMPLANT
TRAP SPECIMEN MUCOUS 40CC (MISCELLANEOUS) IMPLANT
TUBING ENDO SMARTCAP PENTAX (MISCELLANEOUS) IMPLANT
TUBING IRRIGATION ENDOGATOR (MISCELLANEOUS) ×1 IMPLANT
WATER STERILE IRR 1000ML POUR (IV SOLUTION) IMPLANT

## 2023-07-21 NOTE — H&P (Signed)
ERSEL TAHIR HPI: Her last colonoscopy was 04/2013 and it was a normal examination.  Screening colonoscopy will be performed today.  Past Medical History:  Diagnosis Date   Acute metabolic encephalopathy 07/26/2022   ATN (acute tubular necrosis) (HCC) 07/15/2014   Automatic implantable cardioverter-defibrillator in situ    Automatic implantable cardioverter-defibrillator in situ 05/04/2007   Qualifier: Diagnosis of  By: Levon Hedger     Blood transfusion without reported diagnosis    Cardiac defibrillator in situ    Atlas II VR (SJM) implanted by Dr Ladona Ridgel   CHF (congestive heart failure) (HCC)    Chronic combined systolic and diastolic heart failure (HCC)    a. EF 35-40% in past;  b. Echo 7/13:  EF 45-50%, Gr 2 diast dysfn, mild AI, mild MAC, trivial MR, mild LAE, PASP 47.   Chronic ulcer of leg (HCC)    04-09-15 resolved-not a problem.   Colon polyps 04/12/2013   Rectosigmoid polyp   COPD (chronic obstructive pulmonary disease) (HCC)    O2 at night   Depression    Diabetes mellitus    Diabetes mellitus, type 2 (HCC) 04/03/2012   HIGH RISK FEET.. Please have patient take shoes and socks off every visit for visual foot inspection.     Eczema    Elevated alkaline phosphatase level    GGT and 5'nucleotidase 8/13 normal   Health care maintenance 01/22/2013   Surgically absent cervix- no pap needed (Path report 07/2000: uterine body with attached bilateral  adnexa and separate cervix.)   History of oxygen administration    oxygen @ 2 l/m nasally bedtime 24/7   HTN (hypertension)    Hx of cardiac cath    a. LHC 2003 normal;  b. LHC 6/13:  Mild calcification in the LM, o/w normal coronary arteries, EF 45%.    Hyperkalemia 08/08/2017   Hyperlipidemia    Elevated triglyceride in 2019   Hyperlipidemia associated with type 2 diabetes mellitus (HCC) 05/25/2007   Qualifier: Diagnosis of  By: Daphine Deutscher FNP, Nykedtra     Hyperthyroidism, subclinical    HYPERTHYROIDISM, SUBCLINICAL  05/06/2009   Qualifier: Diagnosis of  By: Flonnie Overman     Implantable cardioverter-defibrillator (ICD) generator end of life    Lipoma    Need for hepatitis C screening test 04/25/2019   NICM (nonischemic cardiomyopathy) (HCC)    Obesity    On home oxygen therapy    "2L; 24/7" (10/11/2016)   Post menopausal syndrome    Shortness of breath 07/14/2017   Skin rash 07/12/2018   Sleep apnea    pt denies 04/12/2013    Past Surgical History:  Procedure Laterality Date   ABDOMINAL HYSTERECTOMY     CARDIAC CATHETERIZATION     CARDIAC CATHETERIZATION N/A 06/21/2016   Procedure: Left Heart Cath and Coronary Angiography;  Surgeon: Tonny Bollman, MD;  Location: [facility] INVASIVE CV LAB;  Service: Cardiovascular;  Laterality: N/A;   CARDIAC DEFIBRILLATOR PLACEMENT  05/04/2007   SJM Atlas II VR ICD   CARDIAC DEFIBRILLATOR PLACEMENT     CATARACT EXTRACTION     od   COLONOSCOPY N/A 04/12/2013   Procedure: COLONOSCOPY;  Surgeon: Theda Belfast, MD;  Location: [facility] ENDOSCOPY;  Service: Endoscopy;  Laterality: N/A;  pt.has defibrilator   COLONOSCOPY N/A 04/16/2015   Procedure: COLONOSCOPY;  Surgeon: Rachael Fee, MD;  Location: [facility] ENDOSCOPY;  Service: Endoscopy;  Laterality: N/A;   ESOPHAGOGASTRODUODENOSCOPY N/A 04/16/2015   Procedure: ESOPHAGOGASTRODUODENOSCOPY (EGD);  Surgeon: Rachael Fee, MD;  Location:  [facility] ENDOSCOPY;  Service: Endoscopy;  Laterality: N/A;   ESOPHAGOGASTRODUODENOSCOPY (EGD) WITH PROPOFOL N/A 11/25/2022   Procedure: ESOPHAGOGASTRODUODENOSCOPY (EGD) WITH PROPOFOL;  Surgeon: Jeani Hawking, MD;  Location: [facility] ENDOSCOPY;  Service: Gastroenterology;  Laterality: N/A;   EYE SURGERY     HERNIA REPAIR     HYSTEROSCOPY     IMPLANTABLE CARDIOVERTER DEFIBRILLATOR (ICD) GENERATOR CHANGE N/A 04/02/2014   Procedure: ICD GENERATOR CHANGE;  Surgeon: Marinus Maw, MD;  Location: [facility] CATH LAB;  Service: Cardiovascular;  Laterality: N/A;   INSERT / REPLACE / REMOVE PACEMAKER     LEFT HEART  CATHETERIZATION WITH CORONARY ANGIOGRAM N/A 04/02/2012   Procedure: LEFT HEART CATHETERIZATION WITH CORONARY ANGIOGRAM;  Surgeon: Herby Abraham, MD;  Location: [facility] C[facility] LAB;  Service: Cardiovascular;  Laterality: N/A;   ORIF ANKLE FRACTURE Right 08/12/2019   Procedure: OPEN REDUCTION INTERNAL FIXATION (ORIF) RIGHT TRIMALLEOLAR ANKLE FRACTURE;  Surgeon: Tarry Kos, MD;  Location: [facility] OR;  Service: Orthopedics;  Laterality: Right;   RIGHT HEART CATH N/A 08/03/2022   Procedure: RIGHT HEART CATH;  Surgeon: Dolores Patty, MD;  Location: [facility] INVASIVE CV LAB;  Service: Cardiovascular;  Laterality: N/A;   SAVORY DILATION N/A 11/25/2022   Procedure: SAVORY DILATION;  Surgeon: Jeani Hawking, MD;  Location: [facility] ENDOSCOPY;  Service: Gastroenterology;  Laterality: N/A;   TEE WITHOUT CARDIOVERSION N/A 06/23/2022   Procedure: TRANSESOPHAGEAL ECHOCARDIOGRAM (TEE);  Surgeon: Parke Poisson, MD;  Location: [facility] ENDOSCOPY;  Service: Cardiology;  Laterality: N/A;   TONSILLECTOMY     TUBAL LIGATION      Family History  Problem Relation Age of Onset   Stroke Mother    Seizures Father    Heart disease Father    Diabetes Sister    Asthma Maternal Aunt        aunts   Asthma Maternal Uncle        uncles   Heart disease Paternal Aunt        aunts   Heart disease Paternal Uncle        uncles   Heart disease Maternal Aunt        aunts   Heart disease Maternal Uncle        uncles   Heart disease Maternal Grandfather    Breast cancer Daughter    Breast cancer Cousin    Asthma Grandchild    Colon cancer Neg Hx    Colon polyps Neg Hx    Esophageal cancer Neg Hx    Kidney disease Neg Hx    Gallbladder disease Neg Hx     Social History:  reports that she has been smoking cigarettes. She has a 22.5 pack-year smoking history. She has never used smokeless tobacco. She reports that she does not currently use alcohol after a past usage of about 3.0 standard drinks of alcohol per week. She reports that she  does not use drugs.  Allergies:  Allergies  Allergen Reactions   Actos [Pioglitazone] Other (See Comments)    congestive heart failure    Naproxen Other (See Comments)    500mg  dose made her sleep for two days   Rosiglitazone Hives   Metformin And Related     Caused kidney problems    Tomato Itching   Hydrocodone-Acetaminophen Itching   Hydrocortisone Hives and Itching    Medications: Scheduled: Continuous:  sodium chloride      No results found. However, due to the size of the patient record, not all encounters were searched. Please check Results  Review for a complete set of results.   No results found.  ROS:  As stated above in the HPI otherwise negative.  Blood pressure (!) 169/91, pulse 94, temperature (!) 97 F (36.1 C), temperature source Temporal, resp. rate 12, height 5' (1.524 m), weight 74.4 kg, SpO2 98%.    PE: Gen: NAD, Alert and Oriented HEENT:  [place]/AT, EOMI Neck: Supple, no LAD Lungs: CTA Bilaterally CV: RRR without M/G/R ABD: Soft, NTND, +BS Ext: No C/C/E  Assessment/Plan: 1) Screening colonoscopy.  Shalee Paolo D 07/21/2023, 10:03 AM

## 2023-07-21 NOTE — Op Note (Signed)
[facility] Patient Name: [name] Procedure Date: 07/21/2023 MRN: [id number] Attending MD: Jeani Hawking , MD, [id number] Date of Birth: 10/16/1950 CSN: [id number] Age: 72 Admit Type: Outpatient Procedure:                Colonoscopy Indications:              Screening for colorectal malignant neoplasm Providers:                Jeani Hawking, MD, Jacquelyn "Jaci" Clelia Croft, RN, Harrington Challenger, Technician Referring MD:              Medicines:                 Complications:            No immediate complications. Estimated Blood Loss:     Estimated blood loss: none. Procedure:                Pre-Anesthesia Assessment:                           - Prior to the procedure, a History and Physical                            was performed, and patient medications and                            allergies were reviewed. The patient's tolerance of                            previous anesthesia was also reviewed. The risks                            and benefits of the procedure and the sedation                            options and risks were discussed with the patient.                            All questions were answered, and informed consent                            was obtained. Prior Anticoagulants: The patient has                            taken no anticoagulant or antiplatelet agents. ASA                            Grade Assessment: III - A patient with severe                            systemic disease. After reviewing the risks and  benefits, the patient was deemed in satisfactory                            condition to undergo the procedure.                           - Sedation was administered by an anesthesia                            professional. Deep sedation was attained.                           After obtaining informed consent, the colonoscope                            was passed under direct vision.  Throughout the                            procedure, the patient's blood pressure, pulse, and                            oxygen saturations were monitored continuously. The                            PCF-HQ190L ([id number]) Olympus colonoscope was                            introduced through the anus and advanced to the the                            cecum, identified by appendiceal orifice and                            ileocecal valve. The colonoscopy was performed                            without difficulty. The patient tolerated the                            procedure well. The quality of the bowel                            preparation was evaluated using the BBPS [facility]                            Bowel Preparation Scale) with scores of: Right                            Colon = 3 (entire mucosa seen well with no residual                            staining, small fragments of stool or opaque  liquid), Transverse Colon = 3 (entire mucosa seen                            well with no residual staining, small fragments of                            stool or opaque liquid) and Left Colon = 2 (minor                            amount of residual staining, small fragments of                            stool and/or opaque liquid, but mucosa seen well).                            The total BBPS score equals 8. The quality of the                            bowel preparation was good. The ileocecal valve,                            appendiceal orifice, and rectum were photographed. Scope In: 11:00:59 AM Scope Out: 11:13:16 AM Scope Withdrawal Time: 0 hours 9 minutes 6 seconds  Total Procedure Duration: 0 hours 12 minutes 17 seconds  Findings:      The entire examined colon appeared normal. Impression:               - The entire examined colon is normal.                           - No specimens collected. Moderate Sedation:      Not Applicable - Patient had care per  Anesthesia. Recommendation:           - Patient has a contact number available for                            emergencies. The signs and symptoms of potential                            delayed complications were discussed with the                            patient. Return to normal activities tomorrow.                            Written discharge instructions were provided to the                            patient.                           - Resume previous diet.                           - Continue  present medications.                           - Repeat colonoscopy is not recommended for                            surveillance. Procedure Code(s):        --- Professional ---                           Z6109, Colorectal cancer screening; colonoscopy on                            individual not meeting criteria for high risk Diagnosis Code(s):        --- Professional ---                           Z12.11, Encounter for screening for malignant                            neoplasm of colon CPT copyright 2022 American [employer] Association. All rights reserved. The codes documented in this report are preliminary and upon coder review may  be revised to meet current compliance requirements. Jeani Hawking, MD Jeani Hawking, MD 07/21/2023 11:18:03 AM This report has been signed electronically. Number of Addenda: 0

## 2023-07-21 NOTE — Anesthesia Preprocedure Evaluation (Signed)
Anesthesia Evaluation  Patient identified by MRN, date of birth, ID band Patient awake    Reviewed: Allergy & Precautions, H&P , NPO status , Patient's Chart, lab work & pertinent test results  History of Anesthesia Complications Negative for: history of anesthetic complications  Airway Mallampati: II  TM Distance: >3 FB Neck ROM: Full    Dental  (+) Edentulous Upper, Edentulous Lower   Pulmonary neg shortness of breath, sleep apnea , COPD,  oxygen dependent, neg recent URI, Current Smoker and Patient abstained from smoking.   Pulmonary exam normal breath sounds clear to auscultation       Cardiovascular hypertension, Pt. on medications (-) angina +CHF  (-) Past MI, (-) Cardiac Stents and (-) CABG + dysrhythmias (on amiodarone, prolonged QT) Atrial Fibrillation + Cardiac Defibrillator + Valvular Problems/Murmurs (mild-moderate MR, severe TR)  Rhythm:Regular Rate:Normal  HLD  RHC 08/03/2022: Assessment: 1. Well compensated fluid status with normal cardiac output 2. Mild PH  TTE 07/27/2022: IMPRESSIONS     1. No thrombus seen with Definity contrast. Left ventricular ejection  fraction, by estimation, is 25 to 30%. Left ventricular ejection fraction  by 3D volume is 26 %. The left ventricle has severely decreased function.  The left ventricle demonstrates  global hypokinesis. Left ventricular diastolic function could not be  evaluated. There is the interventricular septum is flattened in diastole  ('D' shaped left ventricle), consistent with right ventricular volume  overload. The average left ventricular  global longitudinal strain is -6.8 %. The global longitudinal strain is  abnormal.   2. Right ventricular systolic function is low normal. The right  ventricular size is mildly enlarged. There is moderately elevated  pulmonary artery systolic pressure.   3. Left atrial size was severely dilated.   4. Right atrial size  was severely dilated.   5. The mitral valve is normal in structure. Mild to moderate mitral valve  regurgitation. Moderate mitral annular calcification.   6. Tricuspid valve regurgitation is severe.   7. The aortic valve is tricuspid. There is mild calcification of the  aortic valve. Aortic valve regurgitation is trivial. Aortic valve  sclerosis/calcification is present, without any evidence of aortic  stenosis.   8. The inferior vena cava is dilated in size with <50% respiratory  variability, suggesting right atrial pressure of 15 mmHg.   LHC 06/21/2016: 1. Angiographically normal coronary arteries 2. Elevated LVEDP (22 mmHg)      Neuro/Psych neg Seizures   Depression    negative neurological ROS  negative psych ROS   GI/Hepatic negative GI ROS, Neg liver ROS,neg GERD  ,,Hepatic steatosis Gastroparesis, dysphagia   Endo/Other  negative endocrine ROSdiabetes, Poorly Controlled, Type 2    Renal/GU CRF and Renal InsufficiencyRenal disease  negative genitourinary   Musculoskeletal negative musculoskeletal ROS (+)  Osteoporosis    Abdominal  (+) + obese  Peds negative pediatric ROS (+)  Hematology negative hematology ROS (+)   Anesthesia Other Findings Eczema   Reproductive/Obstetrics negative OB ROS                             Anesthesia Physical Anesthesia Plan  ASA: 4  Anesthesia Plan: MAC   Post-op Pain Management:    Induction: Intravenous  PONV Risk Score and Plan: 1 and Propofol infusion and Treatment may vary due to age or medical condition  Airway Management Planned: Natural Airway and Nasal Cannula  Additional Equipment:   Intra-op Plan:   Post-operative Plan:  Informed Consent: I have reviewed the patients History and Physical, chart, labs and discussed the procedure including the risks, benefits and alternatives for the proposed anesthesia with the patient or authorized representative who has indicated his/her  understanding and acceptance.       Plan Discussed with: CRNA and Anesthesiologist  Anesthesia Plan Comments: (Magnet available.  Discussed with patient risks of MAC including, but not limited to, minor pain or discomfort, hearing people in the room, and possible need for backup general anesthesia. Risks for general anesthesia also discussed including, but not limited to, sore throat, hoarse voice, chipped/damaged teeth, injury to vocal cords, nausea and vomiting, allergic reactions, lung infection, heart attack, stroke, and death. All questions answered. )        Anesthesia Quick Evaluation

## 2023-07-21 NOTE — Anesthesia Procedure Notes (Signed)
Procedure Name: MAC Date/Time: 07/21/2023 10:55 AM  Performed by: Nelle Don, CRNAPre-anesthesia Checklist: Patient identified, Emergency Drugs available, Suction available and Patient being monitored Oxygen Delivery Method: Simple face mask

## 2023-07-21 NOTE — Anesthesia Postprocedure Evaluation (Signed)
Anesthesia Post Note  Patient: [name]  Procedure(s) Performed: COLONOSCOPY WITH PROPOFOL     Patient location during evaluation: PACU Anesthesia Type: MAC Level of consciousness: awake and alert Pain management: pain level controlled Vital Signs Assessment: post-procedure vital signs reviewed and stable Respiratory status: spontaneous breathing, nonlabored ventilation and respiratory function stable Cardiovascular status: blood pressure returned to baseline and stable Postop Assessment: no apparent nausea or vomiting Anesthetic complications: no   No notable events documented.  Last Vitals:  Vitals:   07/21/23 1130 07/21/23 1147  BP: (!) 149/78 (!) 160/97  Pulse: 80 77  Resp: 19 (!) 21  Temp:    SpO2: 100% 100%    Last Pain:  Vitals:   07/21/23 1147  TempSrc:   PainSc: 0-No pain                 Lowella Curb

## 2023-07-21 NOTE — Discharge Instructions (Signed)

## 2023-07-21 NOTE — Transfer of Care (Signed)
Immediate Anesthesia Transfer of Care Note  Patient: [name]  Procedure(s) Performed: COLONOSCOPY WITH PROPOFOL  Patient Location: PACU and Endoscopy Unit  Anesthesia Type:MAC  Level of Consciousness: oriented, drowsy, and patient cooperative  Airway & Oxygen Therapy: Patient Spontanous Breathing and Patient connected to face mask oxygen  Post-op Assessment: Report given to RN and Post -op Vital signs reviewed and stable  Post vital signs: Reviewed and stable  Last Vitals:  Vitals Value Taken Time  BP 115/66 07/21/23 1120  Temp    Pulse 79 07/21/23 1121  Resp 25 07/21/23 1121  SpO2 100 % 07/21/23 1121  Vitals shown include unfiled device data.  Last Pain:  Vitals:   07/21/23 0954  TempSrc: Temporal  PainSc: 0-No pain         Complications: No notable events documented.

## 2023-07-21 NOTE — Progress Notes (Signed)
Contacted pt to see if she was planning to come in for her procedure since she is late.  Pt states she got a nail in her tire and was back at home.  She wanted to still do procedure since she did drink all her prep for her colonoscopy.  Pt will contact her daughter to see if she is able to bring her in for the procedure this morning and will call us to let us know.

## 2023-07-24 ENCOUNTER — Encounter (HOSPITAL_COMMUNITY): Payer: Self-pay | Admitting: Gastroenterology

## 2023-07-24 NOTE — Plan of Care (Signed)
CHL Tonsillectomy/Adenoidectomy, Postoperative PEDS care plan entered in error.

## 2023-07-27 ENCOUNTER — Telehealth: Payer: Self-pay | Admitting: Dietician

## 2023-07-27 NOTE — Telephone Encounter (Signed)
per patient will get CGM from pharmacy no [place] [facility],  she also asked about eating fruit because it is  increasing her blood sugar. She was advised to eat smaller portions and combine it with a food high in protein

## 2023-08-01 ENCOUNTER — Ambulatory Visit: Payer: 59 | Admitting: Internal Medicine

## 2023-08-01 ENCOUNTER — Other Ambulatory Visit: Payer: Self-pay

## 2023-08-01 ENCOUNTER — Encounter: Payer: Self-pay | Admitting: Internal Medicine

## 2023-08-01 VITALS — BP 133/65 | HR 84 | Temp 97.5°F | Ht 62.0 in | Wt 173.5 lb

## 2023-08-01 DIAGNOSIS — I5022 Chronic systolic (congestive) heart failure: Secondary | ICD-10-CM

## 2023-08-01 DIAGNOSIS — Z23 Encounter for immunization: Secondary | ICD-10-CM | POA: Diagnosis not present

## 2023-08-01 DIAGNOSIS — E1165 Type 2 diabetes mellitus with hyperglycemia: Secondary | ICD-10-CM | POA: Diagnosis not present

## 2023-08-01 DIAGNOSIS — E119 Type 2 diabetes mellitus without complications: Secondary | ICD-10-CM

## 2023-08-01 DIAGNOSIS — I110 Hypertensive heart disease with heart failure: Secondary | ICD-10-CM

## 2023-08-01 DIAGNOSIS — Z7985 Long-term (current) use of injectable non-insulin antidiabetic drugs: Secondary | ICD-10-CM

## 2023-08-01 DIAGNOSIS — I10 Essential (primary) hypertension: Secondary | ICD-10-CM

## 2023-08-01 DIAGNOSIS — Z7984 Long term (current) use of oral hypoglycemic drugs: Secondary | ICD-10-CM

## 2023-08-01 LAB — POCT GLYCOSYLATED HEMOGLOBIN (HGB A1C): Hemoglobin A1C: 9.2 % — AB (ref 4.0–5.6)

## 2023-08-01 LAB — GLUCOSE, CAPILLARY: Glucose-Capillary: 119 mg/dL — ABNORMAL HIGH (ref 70–99)

## 2023-08-01 NOTE — Assessment & Plan Note (Signed)
Patient has been compliant with Anoro Ellipta and has been using 2L supplemental oxygen at night. Denies chest pain, increased cough or sputum production, increased dyspnea. She reports only smoking a cigarette when a friend comes over, otherwise does not buy any of her own. She feels well and has no complaints as far as her respiratory status. Overall symptoms seem to be stable without significant worsening or exacerbation.  -Continue Anoro Ellipta and PRN albuterol -2L O2 at night

## 2023-08-01 NOTE — Assessment & Plan Note (Addendum)
A1c today is 9.2 from 8.0 three months ago. Current regimen includes Farxiga 10mg , Ozempic 0.25mg  weekly, Degludec 5 units at night. She does report some episodes of symptomatic hypoglycemia mostly occurring at night. Her Dexcom shows an average blood glucose of 180 with high readings in the afternoon. Discussed her afternoon meals and strategies to avoid high glycemic index foods around that time.  -Increase Ozempic to 0.5mg  weekly with follow up in 4 weeks -Continue Farxiga 10mg , Degludec 5 units nightly

## 2023-08-01 NOTE — Patient Instructions (Signed)
[name],  It was a pleasure meeting you today,   For your diabetes, I am increasing your Ozempic to 0.5 weekly. We will plan to see you in another month to see how your blood sugar is going.   Please call the clinic if you have any questions.   Thanks,  Dr Carlynn Purl

## 2023-08-01 NOTE — Progress Notes (Signed)
Subjective:  CC: follow up DM, HTN, COPD, HF  HPI:  [name] is a 72 y.o. female with a past medical history stated below and presents today for above. Please see problem based assessment and plan for additional details.  Past Medical History:  Diagnosis Date   Acute metabolic encephalopathy 07/26/2022   ATN (acute tubular necrosis) (HCC) 07/15/2014   Automatic implantable cardioverter-defibrillator in situ    Automatic implantable cardioverter-defibrillator in situ 05/04/2007   Qualifier: Diagnosis of  By: Levon Hedger     Blood transfusion without reported diagnosis    Cardiac defibrillator in situ    Atlas II VR (SJM) implanted by Dr Ladona Ridgel   CHF (congestive heart failure) (HCC)    Chronic combined systolic and diastolic heart failure (HCC)    a. EF 35-40% in past;  b. Echo 7/13:  EF 45-50%, Gr 2 diast dysfn, mild AI, mild MAC, trivial MR, mild LAE, PASP 47.   Chronic ulcer of leg (HCC)    04-09-15 resolved-not a problem.   Colon polyps 04/12/2013   Rectosigmoid polyp   COPD (chronic obstructive pulmonary disease) (HCC)    O2 at night   Depression    Diabetes mellitus    Diabetes mellitus, type 2 (HCC) 04/03/2012   HIGH RISK FEET.. Please have patient take shoes and socks off every visit for visual foot inspection.     Eczema    Elevated alkaline phosphatase level    GGT and 5'nucleotidase 8/13 normal   Health care maintenance 01/22/2013   Surgically absent cervix- no pap needed (Path report 07/2000: uterine body with attached bilateral  adnexa and separate cervix.)   History of oxygen administration    oxygen @ 2 l/m nasally bedtime 24/7   HTN (hypertension)    Hx of cardiac cath    a. LHC 2003 normal;  b. LHC 6/13:  Mild calcification in the LM, o/w normal coronary arteries, EF 45%.    Hyperkalemia 08/08/2017   Hyperlipidemia    Elevated triglyceride in 2019   Hyperlipidemia associated with type 2 diabetes mellitus (HCC) 05/25/2007   Qualifier:  Diagnosis of  By: Daphine Deutscher FNP, [place]     Hyperthyroidism, subclinical    HYPERTHYROIDISM, SUBCLINICAL 05/06/2009   Qualifier: Diagnosis of  By: Flonnie Overman     Implantable cardioverter-defibrillator (ICD) generator end of life    Lipoma    Need for hepatitis C screening test 04/25/2019   NICM (nonischemic cardiomyopathy) (HCC)    Obesity    On home oxygen therapy    "2L; 24/7" (10/11/2016)   Post menopausal syndrome    Shortness of breath 07/14/2017   Skin rash 07/12/2018   Sleep apnea    pt denies 04/12/2013    Current Outpatient Medications on File Prior to Visit  Medication Sig Dispense Refill   Accu-Chek Softclix Lancets lancets Check BG as directed. 100 each 3   albuterol (VENTOLIN HFA) 108 (90 Base) MCG/ACT inhaler Inhale 2 puffs into the lungs every 6 (six) hours as needed for wheezing or shortness of breath. 8 g 2   amiodarone (PACERONE) 200 MG tablet Take 0.5 tablets (100 mg total) by mouth daily. 45 tablet 2   apixaban (ELIQUIS) 2.5 MG TABS tablet Take 1 tablet (2.5 mg total) by mouth 2 (two) times daily. 60 tablet 2   aspirin EC 81 MG tablet Take 81 mg by mouth daily. Swallow whole.     atorvastatin (LIPITOR) 80 MG tablet Take 80 mg by mouth at bedtime.  calcium-vitamin D (OSCAL WITH D) 500-200 MG-UNIT tablet Take 1 tablet by mouth 2 (two) times daily. 90 tablet 6   Continuous Glucose Receiver (DEXCOM G7 RECEIVER) DEVI Place new sensor every 10 days, use to monitor blood sugar continuously 1 each 0   Continuous Glucose Sensor (DEXCOM G7 SENSOR) MISC Place new sensor every 10 days. Use to monitor blood sugar continuously. 9 each 3   dapagliflozin propanediol (FARXIGA) 10 MG TABS tablet Take 1 tablet (10 mg total) by mouth daily. 90 tablet 1   insulin degludec (TRESIBA FLEXTOUCH) 100 UNIT/ML FlexTouch Pen Inject 5 Units into the skin daily.     Insulin Pen Needle (PEN NEEDLES) 32G X 4 MM MISC 1 Needle by Does not apply route daily. 100 each 1   isosorbide-hydrALAZINE  (BIDIL) 20-37.5 MG tablet Take 1 tablet by mouth 3 (three) times daily. 90 tablet 3   Lancets Misc. (ACCU-CHEK SOFTCLIX LANCET DEV) KIT Check BG as directed. 1 kit 1   levocetirizine (XYZAL) 5 MG tablet Take 5 mg by mouth at bedtime.     metoprolol succinate (TOPROL-XL) 25 MG 24 hr tablet Take 25 mg by mouth daily.     OXYGEN Inhale 2 L into the lungs at bedtime.     OZEMPIC, 0.25 OR 0.5 MG/DOSE, 2 MG/3ML SOPN Inject 0.25 mg into the skin every Wednesday.     potassium chloride SA (KLOR-CON M) 20 MEQ tablet Take 1 tablet (20 mEq total) by mouth daily. 90 tablet 3   spironolactone (ALDACTONE) 25 MG tablet Take 0.5 tablets (12.5 mg total) by mouth daily. 45 tablet 3   torsemide (DEMADEX) 20 MG tablet Take 80 mg by mouth 2 (two) times daily.     triamcinolone ointment (KENALOG) 0.1 % Apply 1 Application topically 2 (two) times daily. Mix with Eucerin cream     umeclidinium-vilanterol (ANORO ELLIPTA) 62.5-25 MCG/ACT AEPB Inhale 1 puff into the lungs daily. 60 each 2   No current facility-administered medications on file prior to visit.    Review of Systems: ROS negative except for as is noted on the assessment and plan.  Objective:   Vitals:   08/01/23 1000  BP: 133/65  Pulse: 84  Temp: (!) 97.5 F (36.4 C)  TempSrc: Oral  SpO2: 96%  Weight: 173 lb 8 oz (78.7 kg)  Height: 5\' 2"  (1.575 m)    Physical Exam: Constitutional: well-appearing, in no acute distress HENT: normocephalic atraumatic, mucous membranes moist Eyes: conjunctiva non-erythematous Neck: supple Cardiovascular: regular rate and rhythm, no m/r/g Pulmonary/Chest: some mild inspiratory crackles and expiratory wheezes Abdominal: soft, non-tender, non-distended MSK: normal bulk and tone Neurological: alert & oriented x 3, 5/5 strength in bilateral upper and lower extremities, normal gait Skin: warm and dry, 1+ pitting edema of bilateral lower extremities  Assessment & Plan:   COPD (chronic obstructive pulmonary  disease) (HCC) Patient has been compliant with Anoro Ellipta and has been using 2L supplemental oxygen at night. Denies chest pain, increased cough or sputum production, increased dyspnea. She reports only smoking a cigarette when a friend comes over, otherwise does not buy any of her own. She feels well and has no complaints as far as her respiratory status. Overall symptoms seem to be stable without significant worsening or exacerbation.  -Continue Anoro Ellipta and PRN albuterol -2L O2 at night  Uncontrolled type 2 diabetes mellitus with hyperglycemia (HCC) A1c today is 9.2 from 8.0 three months ago. Current regimen includes Farxiga 10mg , Ozempic 0.25mg  weekly, Degludec 5 units at night.  She does report some episodes of symptomatic hypoglycemia mostly occurring at night. Her Dexcom shows an average blood glucose of 180 with high readings in the afternoon. Discussed her afternoon meals and strategies to avoid high glycemic index foods around that time.  -Increase Ozempic to 0.5mg  weekly with follow up in 4 weeks -Continue Farxiga 10mg , Degludec 5 units nightly  Chronic systolic CHF (congestive heart failure) (HCC) HFrEF 10/23 echo with EF of 20-30% with ICD in place. Current regimen includes metoprolol XL 25mg , Farxiga 10mg , Bidil 20-37.5mg  TID, torsemide 80mg  BID. Dry weight is approximately 171lb, weight today is 173lb. Notably, a recent weight recording of 164lb was immediately before a colonoscopy after completing bowel prep. Patient feels well and states she has been breathing fine without any need for extra diuresis. Denies chest pain, edema, PND. No signs of overt volume overload. Overall well-compensated, no change in medication management at this time.  -Continue current regimen  Essential hypertension BP today is 133/65, at goal.  -Continue current regimen Bidil 20-37.5 TID, Aldactone 12.5mg , Toprol XL 25mg     Patient seen with Dr. Assunta Gambles MD [facility] Internal  Medicine  PGY-1 Pager: [phone] Date 08/01/2023  Time 1:43 PM

## 2023-08-01 NOTE — Assessment & Plan Note (Signed)
HFrEF 10/23 echo with EF of 20-30% with ICD in place. Current regimen includes metoprolol XL 25mg , Farxiga 10mg , Bidil 20-37.5mg  TID, torsemide 80mg  BID. Dry weight is approximately 171lb, weight today is 173lb. Notably, a recent weight recording of 164lb was immediately before a colonoscopy after completing bowel prep. Patient feels well and states she has been breathing fine without any need for extra diuresis. Denies chest pain, edema, PND. No signs of overt volume overload. Overall well-compensated, no change in medication management at this time.  -Continue current regimen

## 2023-08-01 NOTE — Assessment & Plan Note (Signed)
BP today is 133/65, at goal.  -Continue current regimen Bidil 20-37.5 TID, Aldactone 12.5mg , Toprol XL 25mg 

## 2023-08-02 NOTE — Progress Notes (Signed)
Internal Medicine Clinic Attending  I was physically present during the key portions of the resident provided service and participated in the medical decision making of patient's management care. I reviewed pertinent patient test results.  The assessment, diagnosis, and plan were formulated together and I agree with the documentation in the resident's note.  Mercie Eon, MD    On CGM review, sugars have been high a lot in the late afternoon. She isn't eating consistent meals right now, but does tend to eat around 4-5pm. Has access to food. She will work on trying to eat 3 meals or snacks daily, more consistently. I agree with increasing Ozempic as next step. At follow-up, consider increasing Ozempic again and possibly small dose increase in insulin if she's not having any lows

## 2023-08-07 ENCOUNTER — Encounter: Payer: Self-pay | Admitting: Dietician

## 2023-08-11 ENCOUNTER — Telehealth (HOSPITAL_COMMUNITY): Payer: Self-pay

## 2023-08-11 NOTE — Telephone Encounter (Signed)
Called to confirm/remind patient of their appointment [facility] on 08/14/23  Patient reminded to bring all medications and/or complete list.  Confirmed patient has transportation. Gave directions, instructed to utilize valet parking.  Confirmed appointment prior to ending call.

## 2023-08-14 ENCOUNTER — Ambulatory Visit (HOSPITAL_COMMUNITY)
Admission: RE | Admit: 2023-08-14 | Discharge: 2023-08-14 | Disposition: A | Discharge status: Home / Self Care | Payer: 59 | Source: Ambulatory Visit | Attending: Family Medicine | Admitting: Family Medicine

## 2023-08-14 ENCOUNTER — Encounter (HOSPITAL_COMMUNITY): Payer: Self-pay

## 2023-08-14 VITALS — BP 104/66 | HR 80 | Wt 175.8 lb

## 2023-08-14 DIAGNOSIS — J441 Chronic obstructive pulmonary disease with (acute) exacerbation: Secondary | ICD-10-CM | POA: Diagnosis not present

## 2023-08-14 DIAGNOSIS — I428 Other cardiomyopathies: Secondary | ICD-10-CM | POA: Insufficient documentation

## 2023-08-14 DIAGNOSIS — Z7985 Long-term (current) use of injectable non-insulin antidiabetic drugs: Secondary | ICD-10-CM | POA: Diagnosis not present

## 2023-08-14 DIAGNOSIS — Z79899 Other long term (current) drug therapy: Secondary | ICD-10-CM | POA: Diagnosis not present

## 2023-08-14 DIAGNOSIS — I130 Hypertensive heart and chronic kidney disease with heart failure and stage 1 through stage 4 chronic kidney disease, or unspecified chronic kidney disease: Secondary | ICD-10-CM | POA: Insufficient documentation

## 2023-08-14 DIAGNOSIS — Z7984 Long term (current) use of oral hypoglycemic drugs: Secondary | ICD-10-CM | POA: Insufficient documentation

## 2023-08-14 DIAGNOSIS — E669 Obesity, unspecified: Secondary | ICD-10-CM | POA: Diagnosis not present

## 2023-08-14 DIAGNOSIS — Z720 Tobacco use: Secondary | ICD-10-CM

## 2023-08-14 DIAGNOSIS — F17210 Nicotine dependence, cigarettes, uncomplicated: Secondary | ICD-10-CM | POA: Insufficient documentation

## 2023-08-14 DIAGNOSIS — Z6832 Body mass index (BMI) 32.0-32.9, adult: Secondary | ICD-10-CM | POA: Insufficient documentation

## 2023-08-14 DIAGNOSIS — E119 Type 2 diabetes mellitus without complications: Secondary | ICD-10-CM

## 2023-08-14 DIAGNOSIS — E1122 Type 2 diabetes mellitus with diabetic chronic kidney disease: Secondary | ICD-10-CM | POA: Insufficient documentation

## 2023-08-14 DIAGNOSIS — Z7901 Long term (current) use of anticoagulants: Secondary | ICD-10-CM | POA: Diagnosis not present

## 2023-08-14 DIAGNOSIS — I4892 Unspecified atrial flutter: Secondary | ICD-10-CM | POA: Insufficient documentation

## 2023-08-14 DIAGNOSIS — I480 Paroxysmal atrial fibrillation: Secondary | ICD-10-CM

## 2023-08-14 DIAGNOSIS — Z7952 Long term (current) use of systemic steroids: Secondary | ICD-10-CM | POA: Diagnosis not present

## 2023-08-14 DIAGNOSIS — I5022 Chronic systolic (congestive) heart failure: Secondary | ICD-10-CM

## 2023-08-14 DIAGNOSIS — N184 Chronic kidney disease, stage 4 (severe): Secondary | ICD-10-CM | POA: Diagnosis not present

## 2023-08-14 DIAGNOSIS — I10 Essential (primary) hypertension: Secondary | ICD-10-CM

## 2023-08-14 DIAGNOSIS — Z794 Long term (current) use of insulin: Secondary | ICD-10-CM | POA: Diagnosis not present

## 2023-08-14 LAB — CBC
HCT: 43.6 % (ref 36.0–46.0)
Hemoglobin: 13 g/dL (ref 12.0–15.0)
MCH: 26 pg (ref 26.0–34.0)
MCHC: 29.8 g/dL — ABNORMAL LOW (ref 30.0–36.0)
MCV: 87.2 fL (ref 80.0–100.0)
Platelets: 254 10*3/uL (ref 150–400)
RBC: 5 MIL/uL (ref 3.87–5.11)
RDW: 14.8 % (ref 11.5–15.5)
WBC: 8.6 10*3/uL (ref 4.0–10.5)
nRBC: 0 % (ref 0.0–0.2)

## 2023-08-14 LAB — COMPREHENSIVE METABOLIC PANEL
ALT: 14 U/L (ref 0–44)
AST: 18 U/L (ref 15–41)
Albumin: 3.5 g/dL (ref 3.5–5.0)
Alkaline Phosphatase: 117 U/L (ref 38–126)
Anion gap: 12 (ref 5–15)
BUN: 44 mg/dL — ABNORMAL HIGH (ref 8–23)
CO2: 34 mmol/L — ABNORMAL HIGH (ref 22–32)
Calcium: 9.6 mg/dL (ref 8.9–10.3)
Chloride: 96 mmol/L — ABNORMAL LOW (ref 98–111)
Creatinine, Ser: 2.11 mg/dL — ABNORMAL HIGH (ref 0.44–1.00)
GFR, Estimated: 25 mL/min — ABNORMAL LOW (ref >60)
Glucose, Bld: 194 mg/dL — ABNORMAL HIGH (ref 70–99)
Potassium: 4.3 mmol/L (ref 3.5–5.1)
Sodium: 142 mmol/L (ref 135–145)
Total Bilirubin: 0.6 mg/dL (ref <1.2)
Total Protein: 7.3 g/dL (ref 6.5–8.1)

## 2023-08-14 LAB — BRAIN NATRIURETIC PEPTIDE: B Natriuretic Peptide: 69.7 pg/mL (ref 0.0–100.0)

## 2023-08-14 LAB — TSH: TSH: 1.876 u[IU]/mL (ref 0.350–4.500)

## 2023-08-14 MED ORDER — PREDNISONE 20 MG PO TABS: 40.0000 mg | ORAL_TABLET | Freq: Every day | ORAL | 0 refills | Status: AC

## 2023-08-14 NOTE — Progress Notes (Signed)
ReDS Vest / Clip - 08/14/23 1000       ReDS Vest / Clip   Station Marker A    Ruler Value 23.5    ReDS Value Range Moderate volume overload    ReDS Actual Value 36

## 2023-08-14 NOTE — Patient Instructions (Addendum)
EKG done today.  RedsClip done today.   Labs done today. We will contact you only if your labs are abnormal.  STOP taking Metoprolol   START Prednisone 40mg  (2 tablets) by mouth daily for 5 days.  HOLD Torsemide today only  USE 1 Furoscix kit today, take an EXTRA (2 tablets) of Potassium today.  TOMORROW RESTART TORSEMIDE 80MG  (4 tablets)  BY MOUTH 2 TIMES DAILY.   A chest x-ray takes a picture of the organs and structures inside the chest, including the heart, lungs, and blood vessels. This test can show several things, including, whether the heart is enlarges; whether fluid is building up in the lungs; and whether pacemaker / defibrillator leads are still in place.   No other medication changes were made. Please continue all current medications as prescribed.  Your physician recommends that you schedule a follow-up appointment in: 1-2 weeks with our NP/PA Clinic here in our office.   If you have any questions or concerns before your next appointment please send [place] a message through [place] or call our office at [phone].    TO LEAVE A MESSAGE FOR THE NURSE SELECT OPTION 2, PLEASE LEAVE A MESSAGE INCLUDING: YOUR NAME DATE OF BIRTH CALL BACK NUMBER REASON FOR CALL**this is important as we prioritize the call backs  YOU WILL RECEIVE A CALL BACK THE SAME DAY AS LONG AS YOU CALL BEFORE 4:00 PM   Do the following things EVERYDAY: Weigh yourself in the morning before breakfast. Write it down and keep it in a log. Take your medicines as prescribed Eat low salt foods--Limit salt (sodium) to 2000 mg per day.  Stay as active as you can everyday Limit all fluids for the day to less than 2 liters   At the [facility], you and your health needs are our priority. As part of our continuing mission to provide you with exceptional heart care, we have created designated Provider Care Teams. These Care Teams include your primary Cardiologist (physician) and Advanced  Practice Providers (APPs- Physician Assistants and Nurse Practitioners) who all work together to provide you with the care you need, when you need it.   You may see any of the following providers on your designated Care Team at your next follow up: Dr Arvilla Meres Dr Marca Ancona Dr. Marcos Eke, NP Robbie Lis, [place] [facility] [place], [place] Brynda Peon, NP Karle Plumber, PharmD   Please be sure to bring in all your medications bottles to every appointment.    Thank you for choosing [facility]

## 2023-08-14 NOTE — Addendum Note (Signed)
Encounter addended by: Jacklynn Ganong, FNP on: 08/14/2023 1:05 PM  Actions taken: Clinical Note Signed

## 2023-08-14 NOTE — Progress Notes (Addendum)
ADVANCED HF CLINIC NOTE   PCP: Kathleen Lime, MD HF Cardiologist:  Dr. Gala Romney   HPI: [name] is a 72 y.o. with a history of DM2, COPD, HTN, chronic respiratory failure, tobacco abuse, chronic systolic s/p St Jude ICD, EF has 25-30% for at least 5 years. Last LHC 2017, normal coronaries.    Admitted 9/23 with sepsis 2/2 cellulitis of RLE. Underwent TEE to r/o endocarditis. TEE (9/23) showed LVEF 30%, moderate TR, no valvular vegetation or device vegetation. Admitted a couple weeks later with hypothermia and hypoglycemia. Insulin stopped and SGLT2i continued. Entresto and spiro held due to low BP.   Admitted 10/23 with A/C HFrEF, AECOPD, and new onset A flutter RVR. Given steroinds + inhalers. Started on amio drip + heparin drip. Diuresed with IV lasix.  Hospital course complicated by AKI. Renal US negative for hydronephrosis. Had RHC prior to discharge. Low filing pressures and CI 2.5. Discharge weight 197 pounds.   Seen in ED 03/09/23 with SOB. BNP normal, felt to be COPD exac, improved after duonebs.  Seen in clinic in 8/24. Volume overloaded. Furoscix prescribed. Diuresed well.   Today she returns for HF follow up. Overall feeling poorly. Breathing and swelling worse for past week. Ha chest pains when walking around the house. Denies palpitations, dizziness, abnormal bleeding or PND/Orthopnea. Appetite ok. No fever or chills. Weight up a couple lbs. Taking all medications.    Cardiac Testing  - RHC (11/23):  RA = 9 RV = 55/10 PA = 44/11 (23) PCW = 9 Fick cardiac output/index = 6.1/3.2 Thermo CO/CI = 4.8/2.5 PVR = 2.3 (Fick) 2.9 (TD) Ao sat = 95% PA sat = 67%, 67% PAPi = 5.0  - Echo (10/23): EF 25-30%, severe LV dysfunction, RV low/normal, mild to moderate MR with moderate MAC, severe TR  - TEE (9/23): EF 30%, moderate TR, no valvular vegetation or device vegetation.    - Echo (10/23): EF 25-30% RV low normal. RA/LA severely dilated. D Sahped septum  - Echo (9/22): EF  20-25% RV normal , LA severely reduced   - Echo (2018): EF 25-30% Grade IDD  - LHC 2017 Normal Coronaries.    ROS: All systems negative except as listed in HPI, PMH and Problem List.  SH:  Social History   Socioeconomic History   Marital status: Significant Other    Spouse name: Not on file   Number of children: 3   Years of education: 51   Highest education level: 11th grade  Occupational History   Occupation: disabled  Tobacco Use   Smoking status: Every Day    Current packs/day: 0.50    Average packs/day: 0.5 packs/day for 45.0 years (22.5 ttl pk-yrs)    Types: Cigarettes   Smokeless tobacco: Never   Tobacco comments:    currently smoking  10 cigs per day  Vaping Use   Vaping status: Never Used  Substance and Sexual Activity   Alcohol use: Not Currently    Alcohol/week: 3.0 standard drinks of alcohol    Types: 3 Shots of liquor per week   Drug use: No   Sexual activity: Yes    Partners: Male    Birth control/protection: Surgical  Other Topics Concern   Not on file  Social History Narrative   ** Merged History Encounter **       Married   Patient enjoys going to [employer], church and spending time with family   Social Determinants of Health   Financial Resource Strain: Low Risk  (  12/09/2022)   Overall Financial Resource Strain (CARDIA)    Difficulty of Paying Living Expenses: Not hard at all  Food Insecurity: No Food Insecurity (12/09/2022)   Hunger Vital Sign    Worried About Running Out of Food in the Last Year: Never true    Ran Out of Food in the Last Year: Never true  Transportation Needs: No Transportation Needs (12/09/2022)   PRAPARE - Administrator, Civil Service (Medical): No    Lack of Transportation (Non-Medical): No  Physical Activity: Insufficiently Active (12/09/2022)   Exercise Vital Sign    Days of Exercise per Week: 2 days    Minutes of Exercise per Session: 10 min  Stress: Stress Concern Present (12/09/2022)   [employer] of  Occupational Health - Occupational Stress Questionnaire    Feeling of Stress : To some extent  Social Connections: Moderately Integrated (12/09/2022)   Social Connection and Isolation Panel [NHANES]    Frequency of Communication with Friends and Family: More than three times a week    Frequency of Social Gatherings with Friends and Family: More than three times a week    Attends Religious Services: More than 4 times per year    Active Member of [employer] or Organizations: Yes    Attends Banker Meetings: Never    Marital Status: Widowed  Intimate Partner Violence: Not At Risk (12/09/2022)   Humiliation, Afraid, Rape, and Kick questionnaire    Fear of Current or Ex-Partner: No    Emotionally Abused: No    Physically Abused: No    Sexually Abused: No   FH:  Family History  Problem Relation Age of Onset   Stroke Mother    Seizures Father    Heart disease Father    Diabetes Sister    Asthma Maternal Aunt        aunts   Asthma Maternal Uncle        uncles   Heart disease Paternal Aunt        aunts   Heart disease Paternal Uncle        uncles   Heart disease Maternal Aunt        aunts   Heart disease Maternal Uncle        uncles   Heart disease Maternal Grandfather    Breast cancer Daughter    Breast cancer Cousin    Asthma Grandchild    Colon cancer Neg Hx    Colon polyps Neg Hx    Esophageal cancer Neg Hx    Kidney disease Neg Hx    Gallbladder disease Neg Hx    Past Medical History:  Diagnosis Date   Acute metabolic encephalopathy 07/26/2022   ATN (acute tubular necrosis) (HCC) 07/15/2014   Automatic implantable cardioverter-defibrillator in situ    Automatic implantable cardioverter-defibrillator in situ 05/04/2007   Qualifier: Diagnosis of  By: Levon Hedger     Blood transfusion without reported diagnosis    Cardiac defibrillator in situ    Atlas II VR (SJM) implanted by Dr Ladona Ridgel   CHF (congestive heart failure) (HCC)    Chronic combined systolic and  diastolic heart failure (HCC)    a. EF 35-40% in past;  b. Echo 7/13:  EF 45-50%, Gr 2 diast dysfn, mild AI, mild MAC, trivial MR, mild LAE, PASP 47.   Chronic ulcer of leg (HCC)    04-09-15 resolved-not a problem.   Colon polyps 04/12/2013   Rectosigmoid polyp   COPD (chronic obstructive pulmonary disease) (  HCC)    O2 at night   Depression    Diabetes mellitus    Diabetes mellitus, type 2 (HCC) 04/03/2012   HIGH RISK FEET.. Please have patient take shoes and socks off every visit for visual foot inspection.     Eczema    Elevated alkaline phosphatase level    GGT and 5'nucleotidase 8/13 normal   Health care maintenance 01/22/2013   Surgically absent cervix- no pap needed (Path report 07/2000: uterine body with attached bilateral  adnexa and separate cervix.)   History of oxygen administration    oxygen @ 2 l/m nasally bedtime 24/7   HTN (hypertension)    Hx of cardiac cath    a. LHC 2003 normal;  b. LHC 6/13:  Mild calcification in the LM, o/w normal coronary arteries, EF 45%.    Hyperkalemia 08/08/2017   Hyperlipidemia    Elevated triglyceride in 2019   Hyperlipidemia associated with type 2 diabetes mellitus (HCC) 05/25/2007   Qualifier: Diagnosis of  By: Daphine Deutscher FNP, [place]     Hyperthyroidism, subclinical    HYPERTHYROIDISM, SUBCLINICAL 05/06/2009   Qualifier: Diagnosis of  By: Flonnie Overman     Implantable cardioverter-defibrillator (ICD) generator end of life    Lipoma    Need for hepatitis C screening test 04/25/2019   NICM (nonischemic cardiomyopathy) (HCC)    Obesity    On home oxygen therapy    "2L; 24/7" (10/11/2016)   Post menopausal syndrome    Shortness of breath 07/14/2017   Skin rash 07/12/2018   Sleep apnea    pt denies 04/12/2013   Current Outpatient Medications  Medication Sig Dispense Refill   Accu-Chek Softclix Lancets lancets Check BG as directed. 100 each 3   albuterol (VENTOLIN HFA) 108 (90 Base) MCG/ACT inhaler Inhale 2 puffs into the lungs every 6  (six) hours as needed for wheezing or shortness of breath. 8 g 2   amiodarone (PACERONE) 200 MG tablet Take 0.5 tablets (100 mg total) by mouth daily. 45 tablet 2   apixaban (ELIQUIS) 2.5 MG TABS tablet Take 1 tablet (2.5 mg total) by mouth 2 (two) times daily. 60 tablet 2   aspirin EC 81 MG tablet Take 81 mg by mouth daily. Swallow whole.     atorvastatin (LIPITOR) 80 MG tablet Take 80 mg by mouth at bedtime.     calcium-vitamin D (OSCAL WITH D) 500-200 MG-UNIT tablet Take 1 tablet by mouth 2 (two) times daily. 90 tablet 6   Continuous Glucose Receiver (DEXCOM G7 RECEIVER) DEVI Place new sensor every 10 days, use to monitor blood sugar continuously 1 each 0   Continuous Glucose Sensor (DEXCOM G7 SENSOR) MISC Place new sensor every 10 days. Use to monitor blood sugar continuously. 9 each 3   dapagliflozin propanediol (FARXIGA) 10 MG TABS tablet Take 1 tablet (10 mg total) by mouth daily. 90 tablet 1   insulin degludec (TRESIBA FLEXTOUCH) 100 UNIT/ML FlexTouch Pen Inject 5 Units into the skin daily.     Insulin Pen Needle (PEN NEEDLES) 32G X 4 MM MISC 1 Needle by Does not apply route daily. 100 each 1   isosorbide-hydrALAZINE (BIDIL) 20-37.5 MG tablet Take 1 tablet by mouth 3 (three) times daily. 90 tablet 3   Lancets Misc. (ACCU-CHEK SOFTCLIX LANCET DEV) KIT Check BG as directed. 1 kit 1   levocetirizine (XYZAL) 5 MG tablet Take 5 mg by mouth at bedtime.     metoprolol succinate (TOPROL-XL) 25 MG 24 hr tablet Take 25 mg by  mouth daily.     OXYGEN Inhale 2 L into the lungs at bedtime.     OZEMPIC, 0.25 OR 0.5 MG/DOSE, 2 MG/3ML SOPN Inject 0.25 mg into the skin every Wednesday.     potassium chloride SA (KLOR-CON M) 20 MEQ tablet Take 1 tablet (20 mEq total) by mouth daily. 90 tablet 3   spironolactone (ALDACTONE) 25 MG tablet Take 0.5 tablets (12.5 mg total) by mouth daily. 45 tablet 3   torsemide (DEMADEX) 20 MG tablet Take 80 mg by mouth 2 (two) times daily.     triamcinolone ointment (KENALOG)  0.1 % Apply 1 Application topically 2 (two) times daily. Mix with Eucerin cream     umeclidinium-vilanterol (ANORO ELLIPTA) 62.5-25 MCG/ACT AEPB Inhale 1 puff into the lungs daily. 60 each 2   No current facility-administered medications for this encounter.   BP 104/66   Pulse 80   Wt 79.7 kg (175 lb 12.8 oz)   SpO2 92% Comment: Patient is on 2-liters of room oxygen.  BMI 32.15 kg/m   Wt Readings from Last 3 Encounters:  08/14/23 79.7 kg (175 lb 12.8 oz)  08/01/23 78.7 kg (173 lb 8 oz)  07/21/23 74.4 kg (164 lb)   PHYSICAL EXAM: General:  Arrived in WC, mild conversational dyspnea, placed on 2L oxygen HEENT: Normal Neck: Supple. No JVD. Carotids 2+ bilat; no bruits. No lymphadenopathy or thryomegaly appreciated. Cor: PMI nondisplaced. Regular rate & rhythm. No rubs, gallops or murmurs. Lungs: Wheezes throughout Abdomen: Soft, obese, nontender, nondistended. No hepatosplenomegaly. No bruits or masses. Good bowel sounds. Extremities: No cyanosis, clubbing, rash, trace LE edema Neuro: Alert & oriented x 3, cranial nerves grossly intact. Moves all 4 extremities w/o difficulty. Affect pleasant.  Device interrogation (personally reviewed): CorVue stable  ReDs: 36%  ECG (personally reviewed): NSR 83 bpm   ASSESSMENT & PLAN: 1. Chronic HFrEF  - NICM LHC 2017 normal cors. - Echo (2022):EF 25-30% which has been down for many years.  - Echo (10/23): EF 25-30%, D shaped LV, RV mildly dilated w/ mod elevated RVSP, RV systolic fx mildly reduced, severe BAE, severe TR, mild-mod MR, IVC dilated, assumed RAP 15  - RHC (11/23): well-compensated filling pressures, normal CO and mild PH. - s/p STJ ICD - Worse NYHA III, suspect COPD main driver of symptoms. Volume OK on exam and CorVue, ReDS mildly elevated at 36%. Will attempt gentle increase in her diuresis to see if this helps symptomatically. - With concern for AE COPD & low BP, stop Toprol. Plan to add cardio selective beta blocker down the  road - Can use PRN Furoscix + 40 KCL today (hold torsemide while using Furoscix). - Continue torsemide 80 mg bid starting back tomorrow. - Continue BiDil 1 tab tid. - Continue Farxiga 10 mg daily. - Continue spiro 12.5 mg daily. - Labs today and at follow up in 1 week.  2. AE COPD/Tobacco Abuse - Remains quit. Congratulated. - O2 sats 80% on room air, placed on 2L oxygen and sats up to 92%. - She needs to wear her home oxygen - No fever, + cough with clear sputum. - I asked her to use her albuterol in clinic. - Start prednisone 40 mg daily x 5 days - Get CXR - Suspect COPD main driving her symptoms today. - Needs PCP follow up asap. Needs to go to ED if breathing worsens  3. PAF - NSR on ECG today. - Continue low dose amio 100 mg daily.  - Continue Eliquis 2.5 mg  bid. No bleeding issues. - She says she will not wear CPAP, so will not pursue sleep study. - Check amio labs and CBC   4. CKD IV - Renal u/s (10/23) no hydronephrosis - Baseline SCr 2-3 but more recently 1.4-1.6 - Continue Farxiga - Labs today     5.  DMII - A1c 9.2 (10/24) - On SGLT2i + statin. - Management per PCP.  6. Obesity Body mass index is 32.15 kg/m. - She is on Ozempic.  7. HTN - BP much improved. - Continue GDMT as above.   Follow up in 1 week with APP.  Anderson [place] Edgard Debord FNP-BC 9:57 AM

## 2023-08-18 NOTE — Progress Notes (Signed)
ADVANCED HF CLINIC NOTE   PCP: Kathleen Lime, MD HF Cardiologist:  Dr. Gala Romney   HPI: [name] is a 72 y.o. with a history of DM2, COPD, HTN, chronic respiratory failure, tobacco abuse, chronic systolic s/p St Jude ICD, EF has 25-30% for at least 5 years. Last LHC 2017, normal coronaries.    Admitted 9/23 with sepsis 2/2 cellulitis of RLE. Underwent TEE to r/o endocarditis. TEE (9/23) showed LVEF 30%, moderate TR, no valvular vegetation or device vegetation. Admitted a couple weeks later with hypothermia and hypoglycemia. Insulin stopped and SGLT2i continued. Entresto and spiro held due to low BP.   Admitted 10/23 with A/C HFrEF, AECOPD, and new onset A flutter RVR. Given steroinds + inhalers. Started on amio drip + heparin drip. Diuresed with IV lasix.  Hospital course complicated by AKI. Renal US negative for hydronephrosis. Had RHC prior to discharge. Low filing pressures and CI 2.5. Discharge weight 197 pounds.   Seen in ED 03/09/23 with SOB. BNP normal, felt to be COPD exac, improved after duonebs.  Seen in clinic in 8/24. Volume overloaded. Furoscix prescribed. Diuresed well.   Today she returns for AHF follow up. Overall feeling good just tired from cooking for thanksgiving. Denies palpitations, CP, or PND/Orthopnea. Persistent edema in ankles. Had some dizziness that quickly resolved with rest. SOB with exertion, resolves with inhaler. Appetite pretty good, watches what she eats. No fever or chills. Weight at home 172 pounds. Taking all medications. Denies ETOH, smokes 3-4 cigarettes a day.    Cardiac Testing  - RHC (11/23):  RA = 9 RV = 55/10 PA = 44/11 (23) PCW = 9 Fick cardiac output/index = 6.1/3.2 Thermo CO/CI = 4.8/2.5 PVR = 2.3 (Fick) 2.9 (TD) Ao sat = 95% PA sat = 67%, 67% PAPi = 5.0  - Echo (10/23): EF 25-30%, severe LV dysfunction, RV low/normal, mild to moderate MR with moderate MAC, severe TR - TEE (9/23): EF 30%, moderate TR, no valvular vegetation or device  vegetation.  - Echo (10/23): EF 25-30% RV low normal. RA/LA severely dilated. D Sahped septum - Echo (9/22): EF 20-25% RV normal , LA severely reduced  - Echo (2018): EF 25-30% Grade IDD - LHC 2017 Normal Coronaries.    ROS: All systems negative except as listed in HPI, PMH and Problem List.  SH:  Social History   Socioeconomic History   Marital status: Significant Other    Spouse name: Not on file   Number of children: 3   Years of education: 57   Highest education level: 11th grade  Occupational History   Occupation: disabled  Tobacco Use   Smoking status: Every Day    Current packs/day: 0.50    Average packs/day: 0.5 packs/day for 45.0 years (22.5 ttl pk-yrs)    Types: Cigarettes   Smokeless tobacco: Never   Tobacco comments:    currently smoking  10 cigs per day  Vaping Use   Vaping status: Never Used  Substance and Sexual Activity   Alcohol use: Not Currently    Alcohol/week: 3.0 standard drinks of alcohol    Types: 3 Shots of liquor per week   Drug use: No   Sexual activity: Yes    Partners: Male    Birth control/protection: Surgical  Other Topics Concern   Not on file  Social History Narrative   ** Merged History Encounter **       Married   Patient enjoys going to [employer], church and spending time with family  Social Determinants of Health   Financial Resource Strain: Low Risk  (12/09/2022)   Overall Financial Resource Strain (CARDIA)    Difficulty of Paying Living Expenses: Not hard at all  Food Insecurity: No Food Insecurity (12/09/2022)   Hunger Vital Sign    Worried About Running Out of Food in the Last Year: Never true    Ran Out of Food in the Last Year: Never true  Transportation Needs: No Transportation Needs (12/09/2022)   PRAPARE - Administrator, Civil Service (Medical): No    Lack of Transportation (Non-Medical): No  Physical Activity: Insufficiently Active (12/09/2022)   Exercise Vital Sign    Days of Exercise per Week: 2 days     Minutes of Exercise per Session: 10 min  Stress: Stress Concern Present (12/09/2022)   [employer] of Occupational Health - Occupational Stress Questionnaire    Feeling of Stress : To some extent  Social Connections: Moderately Integrated (12/09/2022)   Social Connection and Isolation Panel [NHANES]    Frequency of Communication with Friends and Family: More than three times a week    Frequency of Social Gatherings with Friends and Family: More than three times a week    Attends Religious Services: More than 4 times per year    Active Member of [employer] or Organizations: Yes    Attends Banker Meetings: Never    Marital Status: Widowed  Intimate Partner Violence: Not At Risk (12/09/2022)   Humiliation, Afraid, Rape, and Kick questionnaire    Fear of Current or Ex-Partner: No    Emotionally Abused: No    Physically Abused: No    Sexually Abused: No   FH:  Family History  Problem Relation Age of Onset   Stroke Mother    Seizures Father    Heart disease Father    Diabetes Sister    Asthma Maternal Aunt        aunts   Asthma Maternal Uncle        uncles   Heart disease Paternal Aunt        aunts   Heart disease Paternal Uncle        uncles   Heart disease Maternal Aunt        aunts   Heart disease Maternal Uncle        uncles   Heart disease Maternal Grandfather    Breast cancer Daughter    Breast cancer Cousin    Asthma Grandchild    Colon cancer Neg Hx    Colon polyps Neg Hx    Esophageal cancer Neg Hx    Kidney disease Neg Hx    Gallbladder disease Neg Hx    Past Medical History:  Diagnosis Date   Acute metabolic encephalopathy 07/26/2022   ATN (acute tubular necrosis) (HCC) 07/15/2014   Automatic implantable cardioverter-defibrillator in situ    Automatic implantable cardioverter-defibrillator in situ 05/04/2007   Qualifier: Diagnosis of  By: Levon Hedger     Blood transfusion without reported diagnosis    Cardiac defibrillator in situ    Atlas  II VR (SJM) implanted by Dr Ladona Ridgel   CHF (congestive heart failure) (HCC)    Chronic combined systolic and diastolic heart failure (HCC)    a. EF 35-40% in past;  b. Echo 7/13:  EF 45-50%, Gr 2 diast dysfn, mild AI, mild MAC, trivial MR, mild LAE, PASP 47.   Chronic ulcer of leg (HCC)    04-09-15 resolved-not a problem.   Colon polyps  04/12/2013   Rectosigmoid polyp   COPD (chronic obstructive pulmonary disease) (HCC)    O2 at night   Depression    Diabetes mellitus    Diabetes mellitus, type 2 (HCC) 04/03/2012   HIGH RISK FEET.. Please have patient take shoes and socks off every visit for visual foot inspection.     Eczema    Elevated alkaline phosphatase level    GGT and 5'nucleotidase 8/13 normal   Health care maintenance 01/22/2013   Surgically absent cervix- no pap needed (Path report 07/2000: uterine body with attached bilateral  adnexa and separate cervix.)   History of oxygen administration    oxygen @ 2 l/m nasally bedtime 24/7   HTN (hypertension)    Hx of cardiac cath    a. LHC 2003 normal;  b. LHC 6/13:  Mild calcification in the LM, o/w normal coronary arteries, EF 45%.    Hyperkalemia 08/08/2017   Hyperlipidemia    Elevated triglyceride in 2019   Hyperlipidemia associated with type 2 diabetes mellitus (HCC) 05/25/2007   Qualifier: Diagnosis of  By: Daphine Deutscher FNP, Nykedtra     Hyperthyroidism, subclinical    HYPERTHYROIDISM, SUBCLINICAL 05/06/2009   Qualifier: Diagnosis of  By: Flonnie Overman     Implantable cardioverter-defibrillator (ICD) generator end of life    Lipoma    Need for hepatitis C screening test 04/25/2019   NICM (nonischemic cardiomyopathy) (HCC)    Obesity    On home oxygen therapy    "2L; 24/7" (10/11/2016)   Post menopausal syndrome    Shortness of breath 07/14/2017   Skin rash 07/12/2018   Sleep apnea    pt denies 04/12/2013   Current Outpatient Medications  Medication Sig Dispense Refill   Accu-Chek Softclix Lancets lancets Check BG as  directed. 100 each 3   albuterol (VENTOLIN HFA) 108 (90 Base) MCG/ACT inhaler Inhale 2 puffs into the lungs every 6 (six) hours as needed for wheezing or shortness of breath. 8 g 2   amiodarone (PACERONE) 200 MG tablet Take 0.5 tablets (100 mg total) by mouth daily. 45 tablet 2   apixaban (ELIQUIS) 2.5 MG TABS tablet Take 1 tablet (2.5 mg total) by mouth 2 (two) times daily. 60 tablet 2   aspirin EC 81 MG tablet Take 81 mg by mouth daily. Swallow whole.     atorvastatin (LIPITOR) 80 MG tablet Take 80 mg by mouth at bedtime.     calcium-vitamin D (OSCAL WITH D) 500-200 MG-UNIT tablet Take 1 tablet by mouth 2 (two) times daily. 90 tablet 6   Continuous Glucose Receiver (DEXCOM G7 RECEIVER) DEVI Place new sensor every 10 days, use to monitor blood sugar continuously 1 each 0   Continuous Glucose Sensor (DEXCOM G7 SENSOR) MISC Place new sensor every 10 days. Use to monitor blood sugar continuously. 9 each 3   dapagliflozin propanediol (FARXIGA) 10 MG TABS tablet Take 1 tablet (10 mg total) by mouth daily. 90 tablet 1   Insulin Degludec (TRESIBA ) Inject 10 Units into the skin daily.     Insulin Pen Needle (PEN NEEDLES) 32G X 4 MM MISC 1 Needle by Does not apply route daily. 100 each 1   isosorbide-hydrALAZINE (BIDIL) 20-37.5 MG tablet Take 1 tablet by mouth 3 (three) times daily. 90 tablet 3   Lancets Misc. (ACCU-CHEK SOFTCLIX LANCET DEV) KIT Check BG as directed. 1 kit 1   levocetirizine (XYZAL) 5 MG tablet Take 5 mg by mouth at bedtime.     OXYGEN Inhale 2 L  into the lungs at bedtime.     OZEMPIC, 0.25 OR 0.5 MG/DOSE, 2 MG/3ML SOPN Inject 0.25 mg into the skin every Wednesday. 2 mL 0   potassium chloride SA (KLOR-CON M) 20 MEQ tablet Take 1 tablet (20 mEq total) by mouth daily. 90 tablet 3   spironolactone (ALDACTONE) 25 MG tablet Take 0.5 tablets (12.5 mg total) by mouth daily. 45 tablet 3   torsemide (DEMADEX) 20 MG tablet Take 80 mg by mouth 2 (two) times daily.     triamcinolone ointment  (KENALOG) 0.1 % Apply 1 Application topically 2 (two) times daily. Mix with Eucerin cream     umeclidinium-vilanterol (ANORO ELLIPTA) 62.5-25 MCG/ACT AEPB Inhale 1 puff into the lungs daily. 60 each 2   Semaglutide, 1 MG/DOSE, 4 MG/3ML SOPN Inject 1 mg into the skin once a week. (Patient not taking: Reported on 08/30/2023) 3 mL 0   No current facility-administered medications for this encounter.   BP (!) 102/54   Pulse (!) 104   Wt 78 kg (172 lb)   SpO2 93%   BMI 31.46 kg/m   Wt Readings from Last 3 Encounters:  08/30/23 78 kg (172 lb)  08/29/23 78.1 kg (172 lb 3.2 oz)  08/14/23 79.7 kg (175 lb 12.8 oz)   PHYSICAL EXAM: General:  well appearing.  No respiratory difficulty. Arrived in wheelchair.  HEENT: normal Neck: supple. JVD ~9 cm. Carotids 2+ bilat; no bruits. No lymphadenopathy or thyromegaly appreciated. Cor: PMI nondisplaced. Tachy/Regular rate & rhythm. No rubs, gallops or murmurs. Lungs: clear, diminished bases Abdomen: soft, nontender, nondistended. No hepatosplenomegaly. No bruits or masses. Good bowel sounds. Extremities: no cyanosis, clubbing, rash, +2 BLE edema. + TED hose Neuro: alert & oriented x 3, cranial nerves grossly intact. moves all 4 extremities w/o difficulty. Affect pleasant.   Device interrogation (personally reviewed): No VT/VF, CorVue stable   ReDs: 33%  ECG (personally reviewed and confirmed by Dr. Elwyn Lade): ST with PACs 103 bpm  ASSESSMENT & PLAN: 1. Chronic HFrEF  - NICM LHC 2017 normal cors. - Echo (2022):EF 25-30% which has been down for many years.  - Echo (10/23): EF 25-30%, D shaped LV, RV mildly dilated w/ mod elevated RVSP, RV systolic fx mildly reduced, severe BAE, severe TR, mild-mod MR, IVC dilated, assumed RAP 15  - RHC (11/23): well-compensated filling pressures, normal CO and mild PH. - s/p STJ ICD - NYHA III. Volume OK on exam and CorVue, ReDS 33% .  - With concern for AE COPD & low BP, now off Toprol. Start Bisoprolol 2.5 mg  daily - Does not need PRN Furoscix + 40 KCL at this time (knows to hold torsemide while using Furoscix).  - Continue torsemide 80 mg bid  - Continue BiDil 1 tab tid. - Continue Farxiga 10 mg daily. - Continue spiro 12.5 mg daily. - BMET/BNP today  2. COPD/Tobacco Abuse - Smoking again, discussed cessation - Needs to wear her home oxygen - Recently completed prednisone taper  - Suspect COPD main driver of her symptoms - Does not want to be referred back to pulmonologist, states she would not go  3. PAF - Continue low dose amio 100 mg daily.  - Continue Eliquis 2.5 mg bid. No bleeding issues. - She says she will not wear CPAP, so will not pursue sleep study. - Check CBC   4. CKD IV - Renal u/s (10/23) no hydronephrosis - Baseline SCr 2-3 but more recently 1.4-1.6 - Continue Farxiga - Labs today  5.  DMII - A1c 9.2 (10/24) - On SGLT2i + statin. - Management per PCP.  6. Obesity - Body mass index is 31.46 kg/m. - She is on Ozempic.  7. HTN - BP much improved. - Continue GDMT as above.   Follow up in 2-3 months with Dr. Glenice Laine AGACNP-BC  12:02 PM

## 2023-08-21 ENCOUNTER — Other Ambulatory Visit: Payer: Self-pay

## 2023-08-21 MED ORDER — OZEMPIC (0.25 OR 0.5 MG/DOSE) 2 MG/3ML ~~LOC~~ SOPN: 0.2500 mg | PEN_INJECTOR | SUBCUTANEOUS | 0 refills | Status: DC

## 2023-08-29 ENCOUNTER — Encounter: Payer: Self-pay | Admitting: Student

## 2023-08-29 ENCOUNTER — Ambulatory Visit: Payer: 59 | Admitting: Student

## 2023-08-29 ENCOUNTER — Telehealth (HOSPITAL_COMMUNITY): Payer: Self-pay

## 2023-08-29 VITALS — BP 101/64 | HR 88 | Temp 97.8°F | Ht 62.0 in | Wt 172.2 lb

## 2023-08-29 DIAGNOSIS — Z7985 Long-term (current) use of injectable non-insulin antidiabetic drugs: Secondary | ICD-10-CM

## 2023-08-29 DIAGNOSIS — E1165 Type 2 diabetes mellitus with hyperglycemia: Secondary | ICD-10-CM | POA: Diagnosis not present

## 2023-08-29 DIAGNOSIS — I5022 Chronic systolic (congestive) heart failure: Secondary | ICD-10-CM

## 2023-08-29 DIAGNOSIS — I110 Hypertensive heart disease with heart failure: Secondary | ICD-10-CM

## 2023-08-29 DIAGNOSIS — Z7984 Long term (current) use of oral hypoglycemic drugs: Secondary | ICD-10-CM

## 2023-08-29 DIAGNOSIS — I10 Essential (primary) hypertension: Secondary | ICD-10-CM

## 2023-08-29 MED ORDER — SEMAGLUTIDE (1 MG/DOSE) 4 MG/3ML ~~LOC~~ SOPN
1.0000 mg | PEN_INJECTOR | SUBCUTANEOUS | 0 refills | Status: DC
Start: 2023-08-29 — End: 2023-11-10

## 2023-08-29 MED ORDER — SEMAGLUTIDE (1 MG/DOSE) 4 MG/3ML ~~LOC~~ SOPN: 1.0000 mg | PEN_INJECTOR | SUBCUTANEOUS | 0 refills | Status: DC

## 2023-08-29 MED ORDER — DAPAGLIFLOZIN PROPANEDIOL 10 MG PO TABS: 10.0000 mg | ORAL_TABLET | Freq: Every day | ORAL | 1 refills | Status: DC

## 2023-08-29 NOTE — Telephone Encounter (Signed)
Called to confirm/remind patient of their appointment at [facility] on 08/30/23.   Patient reminded to bring all medications and/or complete list.  Confirmed patient has transportation. Gave directions, instructed to utilize valet parking.  Confirmed appointment prior to ending call.

## 2023-08-29 NOTE — Addendum Note (Signed)
Addended by: Kathleen Lime on: 08/29/2023 02:12 PM   Modules accepted: Orders

## 2023-08-29 NOTE — Progress Notes (Signed)
CC: Diabetes follow-up  HPI:  [name] is a 72 y.o. female living with a history stated below and presents today for diabetes follow-up and also discuss other chronic conditions. Please see problem based assessment and plan for additional details.  Past Medical History:  Diagnosis Date   Acute metabolic encephalopathy 07/26/2022   ATN (acute tubular necrosis) (HCC) 07/15/2014   Automatic implantable cardioverter-defibrillator in situ    Automatic implantable cardioverter-defibrillator in situ 05/04/2007   Qualifier: Diagnosis of  By: Levon Hedger     Blood transfusion without reported diagnosis    Cardiac defibrillator in situ    Atlas II VR (SJM) implanted by Dr Ladona Ridgel   CHF (congestive heart failure) (HCC)    Chronic combined systolic and diastolic heart failure (HCC)    a. EF 35-40% in past;  b. Echo 7/13:  EF 45-50%, Gr 2 diast dysfn, mild AI, mild MAC, trivial MR, mild LAE, PASP 47.   Chronic ulcer of leg (HCC)    04-09-15 resolved-not a problem.   Colon polyps 04/12/2013   Rectosigmoid polyp   COPD (chronic obstructive pulmonary disease) (HCC)    O2 at night   Depression    Diabetes mellitus    Diabetes mellitus, type 2 (HCC) 04/03/2012   HIGH RISK FEET.. Please have patient take shoes and socks off every visit for visual foot inspection.     Eczema    Elevated alkaline phosphatase level    GGT and 5'nucleotidase 8/13 normal   Health care maintenance 01/22/2013   Surgically absent cervix- no pap needed (Path report 07/2000: uterine body with attached bilateral  adnexa and separate cervix.)   History of oxygen administration    oxygen @ 2 l/m nasally bedtime 24/7   HTN (hypertension)    Hx of cardiac cath    a. LHC 2003 normal;  b. LHC 6/13:  Mild calcification in the LM, o/w normal coronary arteries, EF 45%.    Hyperkalemia 08/08/2017   Hyperlipidemia    Elevated triglyceride in 2019   Hyperlipidemia associated with type 2 diabetes mellitus (HCC)  05/25/2007   Qualifier: Diagnosis of  By: Daphine Deutscher FNP, Nykedtra     Hyperthyroidism, subclinical    HYPERTHYROIDISM, SUBCLINICAL 05/06/2009   Qualifier: Diagnosis of  By: Flonnie Overman     Implantable cardioverter-defibrillator (ICD) generator end of life    Lipoma    Need for hepatitis C screening test 04/25/2019   NICM (nonischemic cardiomyopathy) (HCC)    Obesity    On home oxygen therapy    "2L; 24/7" (10/11/2016)   Post menopausal syndrome    Shortness of breath 07/14/2017   Skin rash 07/12/2018   Sleep apnea    pt denies 04/12/2013    Current Outpatient Medications on File Prior to Visit  Medication Sig Dispense Refill   Accu-Chek Softclix Lancets lancets Check BG as directed. 100 each 3   albuterol (VENTOLIN HFA) 108 (90 Base) MCG/ACT inhaler Inhale 2 puffs into the lungs every 6 (six) hours as needed for wheezing or shortness of breath. 8 g 2   amiodarone (PACERONE) 200 MG tablet Take 0.5 tablets (100 mg total) by mouth daily. 45 tablet 2   apixaban (ELIQUIS) 2.5 MG TABS tablet Take 1 tablet (2.5 mg total) by mouth 2 (two) times daily. 60 tablet 2   aspirin EC 81 MG tablet Take 81 mg by mouth daily. Swallow whole.     atorvastatin (LIPITOR) 80 MG tablet Take 80 mg by mouth at bedtime.  calcium-vitamin D (OSCAL WITH D) 500-200 MG-UNIT tablet Take 1 tablet by mouth 2 (two) times daily. 90 tablet 6   Continuous Glucose Receiver (DEXCOM G7 RECEIVER) DEVI Place new sensor every 10 days, use to monitor blood sugar continuously 1 each 0   Continuous Glucose Sensor (DEXCOM G7 SENSOR) MISC Place new sensor every 10 days. Use to monitor blood sugar continuously. 9 each 3   dapagliflozin propanediol (FARXIGA) 10 MG TABS tablet Take 1 tablet (10 mg total) by mouth daily. 90 tablet 1   insulin degludec (TRESIBA FLEXTOUCH) 100 UNIT/ML FlexTouch Pen Inject 5 Units into the skin daily.     Insulin Pen Needle (PEN NEEDLES) 32G X 4 MM MISC 1 Needle by Does not apply route daily. 100 each 1    isosorbide-hydrALAZINE (BIDIL) 20-37.5 MG tablet Take 1 tablet by mouth 3 (three) times daily. 90 tablet 3   Lancets Misc. (ACCU-CHEK SOFTCLIX LANCET DEV) KIT Check BG as directed. 1 kit 1   levocetirizine (XYZAL) 5 MG tablet Take 5 mg by mouth at bedtime.     OXYGEN Inhale 2 L into the lungs at bedtime.     OZEMPIC, 0.25 OR 0.5 MG/DOSE, 2 MG/3ML SOPN Inject 0.25 mg into the skin every Wednesday. 2 mL 0   potassium chloride SA (KLOR-CON M) 20 MEQ tablet Take 1 tablet (20 mEq total) by mouth daily. 90 tablet 3   spironolactone (ALDACTONE) 25 MG tablet Take 0.5 tablets (12.5 mg total) by mouth daily. 45 tablet 3   torsemide (DEMADEX) 20 MG tablet Take 80 mg by mouth 2 (two) times daily.     triamcinolone ointment (KENALOG) 0.1 % Apply 1 Application topically 2 (two) times daily. Mix with Eucerin cream     umeclidinium-vilanterol (ANORO ELLIPTA) 62.5-25 MCG/ACT AEPB Inhale 1 puff into the lungs daily. 60 each 2   No current facility-administered medications on file prior to visit.    Family History  Problem Relation Age of Onset   Stroke Mother    Seizures Father    Heart disease Father    Diabetes Sister    Asthma Maternal Aunt        aunts   Asthma Maternal Uncle        uncles   Heart disease Paternal Aunt        aunts   Heart disease Paternal Uncle        uncles   Heart disease Maternal Aunt        aunts   Heart disease Maternal Uncle        uncles   Heart disease Maternal Grandfather    Breast cancer Daughter    Breast cancer Cousin    Asthma Grandchild    Colon cancer Neg Hx    Colon polyps Neg Hx    Esophageal cancer Neg Hx    Kidney disease Neg Hx    Gallbladder disease Neg Hx     Social History   Socioeconomic History   Marital status: Significant Other    Spouse name: Not on file   Number of children: 3   Years of education: 11   Highest education level: 11th grade  Occupational History   Occupation: disabled  Tobacco Use   Smoking status: Every Day     Current packs/day: 0.50    Average packs/day: 0.5 packs/day for 45.0 years (22.5 ttl pk-yrs)    Types: Cigarettes   Smokeless tobacco: Never   Tobacco comments:    currently smoking  10 cigs per  day  Vaping Use   Vaping status: Never Used  Substance and Sexual Activity   Alcohol use: Not Currently    Alcohol/week: 3.0 standard drinks of alcohol    Types: 3 Shots of liquor per week   Drug use: No   Sexual activity: Yes    Partners: Male    Birth control/protection: Surgical  Other Topics Concern   Not on file  Social History Narrative   ** Merged History Encounter **       Married   Patient enjoys going to [employer], church and spending time with family   Social Determinants of Health   Financial Resource Strain: Low Risk  (12/09/2022)   Overall Financial Resource Strain (CARDIA)    Difficulty of Paying Living Expenses: Not hard at all  Food Insecurity: No Food Insecurity (12/09/2022)   Hunger Vital Sign    Worried About Running Out of Food in the Last Year: Never true    Ran Out of Food in the Last Year: Never true  Transportation Needs: No Transportation Needs (12/09/2022)   PRAPARE - Administrator, Civil Service (Medical): No    Lack of Transportation (Non-Medical): No  Physical Activity: Insufficiently Active (12/09/2022)   Exercise Vital Sign    Days of Exercise per Week: 2 days    Minutes of Exercise per Session: 10 min  Stress: Stress Concern Present (12/09/2022)   [employer] of Occupational Health - Occupational Stress Questionnaire    Feeling of Stress : To some extent  Social Connections: Moderately Integrated (12/09/2022)   Social Connection and Isolation Panel [NHANES]    Frequency of Communication with Friends and Family: More than three times a week    Frequency of Social Gatherings with Friends and Family: More than three times a week    Attends Religious Services: More than 4 times per year    Active Member of [employer] or Organizations: Yes     Attends Banker Meetings: Never    Marital Status: Widowed  Intimate Partner Violence: Not At Risk (12/09/2022)   Humiliation, Afraid, Rape, and Kick questionnaire    Fear of Current or Ex-Partner: No    Emotionally Abused: No    Physically Abused: No    Sexually Abused: No    Review of Systems: ROS negative except for what is noted on the assessment and plan.  There were no vitals filed for this visit.  Physical Exam: Constitutional: Well-appearing woman, sitting in chair, in no acute distress HENT: normocephalic atraumatic, mucous membranes moist Cardiovascular: Regular rate and rhythm.  No lower extremity edema appreciated Pulmonary/Chest: Mildly short of breath on exertion, placed on 2 L supplemental oxygen in the office Neurological: alert & oriented x 3, no focal deficit Skin: warm and dry Psych: normal mood and behavior  Assessment & Plan:   No problem-specific Assessment & Plan notes found for this encounter.  Type 2 diabetes Patient is currently on 0.5 Ozempic as well as [place]. Hemoglobin A1c was 9.2 four weeks ago.  CGM average glucose is 285 for the past 4 weeks. - Continue on Ozempic 0.5 mg for the next 3 weeks and titrate up to 1 mg weekly - Increase  Degludec to 10 units nightly - Refill Farxiga  - Follow-up in 4 weeks   Heart failure with reduced ejection fraction History of heart failure with reduced ejection fraction. Patient has ICD in place and on GDMT, seems to be doing well on the current medical therapy. Patient denies  any shortness of breath, orthopnea and I did not appreciate any lower extremity edema on exam in the office today.  Follows up with cardiology, her next appointment is tomorrow 11/27. - Continue on GDMT per cardiology  HTN  History of hypertension.  Pressure in the office today is 101/64 - Continue on same antihypertensive regimen  Patient seen with Dr. Joycelyn Das, M.D [facility] Phone:  [phone] Date 08/29/2023 Time 9:54 AM

## 2023-08-29 NOTE — Patient Instructions (Addendum)
Thank you, Ms.Loretha Stapler for allowing [place] to provide your care today. Today we discussed your general health and your diabetes medication. -Continue taking show 0.5 mg of Ozempic once every week -Continue recording your blood sugar levels just as you are doing -I will see you again in 4 weeks to see how your blood sugar levels are doing -Continue to follow-up with your heart doctors tomorrow as scheduled -Please do not hesitate to call the office if you have any other concerns.   -   I have ordered the following labs for you:  Lab Orders  No laboratory test(s) ordered today     Tests ordered today:    Referrals ordered today:   Referral Orders  No referral(s) requested today     I have ordered the following medication/changed the following medications:   Stop the following medications: Medications Discontinued During This Encounter  Medication Reason   Semaglutide, 1 MG/DOSE, 4 MG/3ML SOPN Duplicate     Start the following medications: Meds ordered this encounter  Medications   DISCONTD: Semaglutide, 1 MG/DOSE, 4 MG/3ML SOPN    Sig: Inject 1 mg into the skin once a week.    Dispense:  3 mL    Refill:  0   Semaglutide, 1 MG/DOSE, 4 MG/3ML SOPN    Sig: Inject 1 mg into the skin once a week.    Dispense:  3 mL    Refill:  0    Dispense after 09/12/2023     Follow up:  4 WEEKS     Remember:   Should you have any questions or concerns please call the internal medicine clinic at [phone].    Kathleen Lime, M.D [facility]

## 2023-08-30 ENCOUNTER — Telehealth (HOSPITAL_COMMUNITY): Payer: Self-pay | Admitting: Cardiology

## 2023-08-30 ENCOUNTER — Ambulatory Visit (HOSPITAL_COMMUNITY)
Admission: RE | Admit: 2023-08-30 | Discharge: 2023-08-30 | Disposition: A | Discharge status: Home / Self Care | Payer: 59 | Source: Ambulatory Visit | Attending: Internal Medicine | Admitting: Internal Medicine

## 2023-08-30 ENCOUNTER — Encounter (HOSPITAL_COMMUNITY): Payer: Self-pay

## 2023-08-30 VITALS — BP 102/54 | HR 104 | Wt 172.0 lb

## 2023-08-30 DIAGNOSIS — Z79899 Other long term (current) drug therapy: Secondary | ICD-10-CM | POA: Insufficient documentation

## 2023-08-30 DIAGNOSIS — E669 Obesity, unspecified: Secondary | ICD-10-CM | POA: Insufficient documentation

## 2023-08-30 DIAGNOSIS — J449 Chronic obstructive pulmonary disease, unspecified: Secondary | ICD-10-CM | POA: Insufficient documentation

## 2023-08-30 DIAGNOSIS — J441 Chronic obstructive pulmonary disease with (acute) exacerbation: Secondary | ICD-10-CM

## 2023-08-30 DIAGNOSIS — I480 Paroxysmal atrial fibrillation: Secondary | ICD-10-CM | POA: Insufficient documentation

## 2023-08-30 DIAGNOSIS — I10 Essential (primary) hypertension: Secondary | ICD-10-CM

## 2023-08-30 DIAGNOSIS — Z7901 Long term (current) use of anticoagulants: Secondary | ICD-10-CM | POA: Insufficient documentation

## 2023-08-30 DIAGNOSIS — Z720 Tobacco use: Secondary | ICD-10-CM

## 2023-08-30 DIAGNOSIS — I130 Hypertensive heart and chronic kidney disease with heart failure and stage 1 through stage 4 chronic kidney disease, or unspecified chronic kidney disease: Secondary | ICD-10-CM | POA: Diagnosis present

## 2023-08-30 DIAGNOSIS — I959 Hypotension, unspecified: Secondary | ICD-10-CM | POA: Diagnosis not present

## 2023-08-30 DIAGNOSIS — I5022 Chronic systolic (congestive) heart failure: Secondary | ICD-10-CM

## 2023-08-30 DIAGNOSIS — E1122 Type 2 diabetes mellitus with diabetic chronic kidney disease: Secondary | ICD-10-CM | POA: Diagnosis not present

## 2023-08-30 DIAGNOSIS — N184 Chronic kidney disease, stage 4 (severe): Secondary | ICD-10-CM | POA: Insufficient documentation

## 2023-08-30 DIAGNOSIS — S82891A Other fracture of right lower leg, initial encounter for closed fracture: Secondary | ICD-10-CM | POA: Insufficient documentation

## 2023-08-30 DIAGNOSIS — I428 Other cardiomyopathies: Secondary | ICD-10-CM | POA: Diagnosis not present

## 2023-08-30 DIAGNOSIS — X58XXXA Exposure to other specified factors, initial encounter: Secondary | ICD-10-CM | POA: Insufficient documentation

## 2023-08-30 DIAGNOSIS — F17210 Nicotine dependence, cigarettes, uncomplicated: Secondary | ICD-10-CM | POA: Insufficient documentation

## 2023-08-30 DIAGNOSIS — Z6831 Body mass index (BMI) 31.0-31.9, adult: Secondary | ICD-10-CM | POA: Insufficient documentation

## 2023-08-30 DIAGNOSIS — Z7985 Long-term (current) use of injectable non-insulin antidiabetic drugs: Secondary | ICD-10-CM | POA: Diagnosis not present

## 2023-08-30 DIAGNOSIS — Z794 Long term (current) use of insulin: Secondary | ICD-10-CM

## 2023-08-30 DIAGNOSIS — E119 Type 2 diabetes mellitus without complications: Secondary | ICD-10-CM

## 2023-08-30 LAB — BASIC METABOLIC PANEL
Anion gap: 12 (ref 5–15)
BUN: 40 mg/dL — ABNORMAL HIGH (ref 8–23)
CO2: 28 mmol/L (ref 22–32)
Calcium: 9.2 mg/dL (ref 8.9–10.3)
Chloride: 99 mmol/L (ref 98–111)
Creatinine, Ser: 2.4 mg/dL — ABNORMAL HIGH (ref 0.44–1.00)
GFR, Estimated: 21 mL/min — ABNORMAL LOW (ref >60)
Glucose, Bld: 355 mg/dL — ABNORMAL HIGH (ref 70–99)
Potassium: 3.7 mmol/L (ref 3.5–5.1)
Sodium: 139 mmol/L (ref 135–145)

## 2023-08-30 LAB — CBC
HCT: 39.7 % (ref 36.0–46.0)
Hemoglobin: 12.6 g/dL (ref 12.0–15.0)
MCH: 26.8 pg (ref 26.0–34.0)
MCHC: 31.7 g/dL (ref 30.0–36.0)
MCV: 84.3 fL (ref 80.0–100.0)
Platelets: 237 10*3/uL (ref 150–400)
RBC: 4.71 MIL/uL (ref 3.87–5.11)
RDW: 14.4 % (ref 11.5–15.5)
WBC: 8.9 10*3/uL (ref 4.0–10.5)
nRBC: 0 % (ref 0.0–0.2)

## 2023-08-30 LAB — BRAIN NATRIURETIC PEPTIDE: B Natriuretic Peptide: 19.8 pg/mL (ref 0.0–100.0)

## 2023-08-30 MED ORDER — BISOPROLOL FUMARATE 5 MG PO TABS: 2.5000 mg | ORAL_TABLET | Freq: Every day | ORAL | 11 refills | Status: DC

## 2023-08-30 NOTE — Patient Instructions (Signed)
START Bisporolol 2.5 mg ( 1/2 Tab) daily.  Labs done today, your results will be available in MyChart, we will contact you for abnormal readings.  Your physician recommends that you schedule a follow-up appointment in: 3 months ( FEBRUARY 2025) ** PLEASE CALL THE OFFICE IN MID DECEMBER TO ARRANGE YOUR FOLLOW UP APPOINTMENT. **  If you have any questions or concerns before your next appointment please send [place] a message through [place] or call our office at [phone].    TO LEAVE A MESSAGE FOR THE NURSE SELECT OPTION 2, PLEASE LEAVE A MESSAGE INCLUDING: YOUR NAME DATE OF BIRTH CALL BACK NUMBER REASON FOR CALL**this is important as we prioritize the call backs  YOU WILL RECEIVE A CALL BACK THE SAME DAY AS LONG AS YOU CALL BEFORE 4:00 PM  At the [facility], you and your health needs are our priority. As part of our continuing mission to provide you with exceptional heart care, we have created designated Provider Care Teams. These Care Teams include your primary Cardiologist (physician) and Advanced Practice Providers (APPs- Physician Assistants and Nurse Practitioners) who all work together to provide you with the care you need, when you need it.   You may see any of the following providers on your designated Care Team at your next follow up: Dr Arvilla Meres Dr Marca Ancona Dr. Dorthula Nettles Dr. Clearnce Hasten Amy Filbert Schilder, NP Robbie Lis, [place] [facility] [place], [place] Brynda Peon, NP [place] Lee, NP Karle Plumber, PharmD   Please be sure to bring in all your medications bottles to every appointment.    Thank you for choosing [facility]

## 2023-08-30 NOTE — Telephone Encounter (Signed)
Patient called.  Patient aware.  

## 2023-08-30 NOTE — Progress Notes (Signed)
ReDS Vest / Clip - 08/30/23 1100       ReDS Vest / Clip   Station Marker A    Ruler Value 28    ReDS Value Range Low volume    ReDS Actual Value 33

## 2023-08-30 NOTE — Telephone Encounter (Signed)
-----   Message from Alen Bleacher sent at 08/30/2023  1:21 PM EST ----- Renal function elevated. Volume marker stable. Hold Torsemide and KDUR for 2 days then restart at 80mg  BID.   Repeat BMET 7-10 days.

## 2023-08-30 NOTE — Progress Notes (Signed)
Internal Medicine Clinic Attending  I was physically present during the key portions of the resident provided service and participated in the medical decision making of patient's management care. I reviewed pertinent patient test results.  The assessment, diagnosis, and plan were formulated together and I agree with the documentation in the resident's note.  Erlinda Hong, MD FACP

## 2023-09-07 ENCOUNTER — Other Ambulatory Visit (HOSPITAL_COMMUNITY): Payer: 59

## 2023-09-07 ENCOUNTER — Encounter: Payer: Self-pay | Admitting: Cardiology

## 2023-09-14 ENCOUNTER — Ambulatory Visit (HOSPITAL_COMMUNITY)
Admission: RE | Admit: 2023-09-14 | Discharge: 2023-09-14 | Disposition: A | Discharge status: Home / Self Care | Payer: 59 | Source: Ambulatory Visit | Attending: Internal Medicine | Admitting: Internal Medicine

## 2023-09-14 DIAGNOSIS — I5022 Chronic systolic (congestive) heart failure: Secondary | ICD-10-CM

## 2023-09-14 LAB — BASIC METABOLIC PANEL
Anion gap: 12 (ref 5–15)
BUN: 33 mg/dL — ABNORMAL HIGH (ref 8–23)
CO2: 27 mmol/L (ref 22–32)
Calcium: 9.1 mg/dL (ref 8.9–10.3)
Chloride: 98 mmol/L (ref 98–111)
Creatinine, Ser: 1.89 mg/dL — ABNORMAL HIGH (ref 0.44–1.00)
GFR, Estimated: 28 mL/min — ABNORMAL LOW (ref >60)
Glucose, Bld: 328 mg/dL — ABNORMAL HIGH (ref 70–99)
Potassium: 3.4 mmol/L — ABNORMAL LOW (ref 3.5–5.1)
Sodium: 137 mmol/L (ref 135–145)

## 2023-09-18 ENCOUNTER — Telehealth (HOSPITAL_COMMUNITY): Payer: Self-pay | Admitting: *Deleted

## 2023-09-18 DIAGNOSIS — I5022 Chronic systolic (congestive) heart failure: Secondary | ICD-10-CM

## 2023-09-18 DIAGNOSIS — E876 Hypokalemia: Secondary | ICD-10-CM

## 2023-09-18 MED ORDER — POTASSIUM CHLORIDE CRYS ER 20 MEQ PO TBCR: 40.0000 meq | EXTENDED_RELEASE_TABLET | Freq: Every day | ORAL | 3 refills | Status: DC

## 2023-09-18 NOTE — Telephone Encounter (Signed)
Called patient per Boyce Medici, PA with following lab results and instructions: "K is low. Renal fx stable. Check compliance w/ KCl and spiro. If she has been fully compliant w/ KCl 20 mEq daily, then will need to increase does to 40 mEq daily and get repeat BMP in 1 wk." She says she has been taking medications daily. Asked her to increase potassium to 40 meq daily and return in one week for labs. Pt verbalized understanding of same.

## 2023-09-26 ENCOUNTER — Ambulatory Visit (HOSPITAL_COMMUNITY)
Admission: RE | Admit: 2023-09-26 | Discharge: 2023-09-26 | Disposition: A | Discharge status: Home / Self Care | Payer: 59 | Source: Ambulatory Visit | Attending: Cardiology | Admitting: Cardiology

## 2023-09-26 DIAGNOSIS — E876 Hypokalemia: Secondary | ICD-10-CM | POA: Insufficient documentation

## 2023-09-26 DIAGNOSIS — I5022 Chronic systolic (congestive) heart failure: Secondary | ICD-10-CM | POA: Diagnosis present

## 2023-09-26 LAB — BASIC METABOLIC PANEL
Anion gap: 11 (ref 5–15)
BUN: 35 mg/dL — ABNORMAL HIGH (ref 8–23)
CO2: 27 mmol/L (ref 22–32)
Calcium: 9.6 mg/dL (ref 8.9–10.3)
Chloride: 103 mmol/L (ref 98–111)
Creatinine, Ser: 1.91 mg/dL — ABNORMAL HIGH (ref 0.44–1.00)
GFR, Estimated: 28 mL/min — ABNORMAL LOW (ref >60)
Glucose, Bld: 146 mg/dL — ABNORMAL HIGH (ref 70–99)
Potassium: 4.7 mmol/L (ref 3.5–5.1)
Sodium: 141 mmol/L (ref 135–145)

## 2023-10-02 ENCOUNTER — Encounter: Payer: 59 | Admitting: Internal Medicine

## 2023-10-02 NOTE — Progress Notes (Deleted)
HFrEF Clinically euvolemic. Compliant with farxiga 10 mg daily, spironolactone 12.5 mg daily, torsemide 20 mg BID, bisoprolol 2.5 mg daily***. Managed by cardiology. Plan:No change in plan.  HTN BP ***. OP regimen is bisoprolol, isosorbide-hydralazine 20-37.5 mg TID which s/he is*** compliant with. BMP last checked 09/26/2023 with stable renal function consistent with CKD stage 4. Plan:Continue current regimen.   PAF Rhythm today ***. On amiodarone 100 mg daily, eliquis 5 mg BID which she is*** compliant with.   COPD  T2DM GASTROPARESIS HbA1c last checked 07/2023 9.2%, today ***. OP regimen is tresiba 10 units daily, semaglutide 0.25 mg once weekly, farxiga 10 mg daily. She does*** check her blood sugar at home. Urine microalbumin/creatinine ratio last checked 04/2023 elevated to 141. Plan: Emphasized importance of annual diabetic eye exam.   THYROID NODULES  CKD4  HLD OP regimen is atorvastatin 80 mg daily which she is*** compliant with. Lipid panel last checked 01/2023 and LDL at goal, 72.  Plan:Continue current regimen.   HCM TDAP [facility] CX SCREEN EYE EXAM

## 2023-10-08 ENCOUNTER — Other Ambulatory Visit (HOSPITAL_COMMUNITY): Payer: Self-pay | Admitting: Family Medicine

## 2023-10-08 DIAGNOSIS — E876 Hypokalemia: Secondary | ICD-10-CM

## 2023-10-08 DIAGNOSIS — I5022 Chronic systolic (congestive) heart failure: Secondary | ICD-10-CM

## 2023-10-10 ENCOUNTER — Other Ambulatory Visit: Payer: Self-pay | Admitting: Student

## 2023-10-10 DIAGNOSIS — J984 Other disorders of lung: Secondary | ICD-10-CM

## 2023-10-11 ENCOUNTER — Ambulatory Visit: Payer: 59 | Admitting: Student

## 2023-10-11 VITALS — BP 120/69 | HR 92 | Temp 98.4°F | Ht 62.0 in | Wt 173.6 lb

## 2023-10-11 DIAGNOSIS — I5022 Chronic systolic (congestive) heart failure: Secondary | ICD-10-CM | POA: Diagnosis not present

## 2023-10-11 DIAGNOSIS — K3184 Gastroparesis: Secondary | ICD-10-CM | POA: Diagnosis not present

## 2023-10-11 DIAGNOSIS — Z7985 Long-term (current) use of injectable non-insulin antidiabetic drugs: Secondary | ICD-10-CM | POA: Diagnosis not present

## 2023-10-11 DIAGNOSIS — E1122 Type 2 diabetes mellitus with diabetic chronic kidney disease: Secondary | ICD-10-CM | POA: Diagnosis not present

## 2023-10-11 DIAGNOSIS — E1165 Type 2 diabetes mellitus with hyperglycemia: Secondary | ICD-10-CM

## 2023-10-11 DIAGNOSIS — E1143 Type 2 diabetes mellitus with diabetic autonomic (poly)neuropathy: Secondary | ICD-10-CM

## 2023-10-11 DIAGNOSIS — I4892 Unspecified atrial flutter: Secondary | ICD-10-CM

## 2023-10-11 DIAGNOSIS — N184 Chronic kidney disease, stage 4 (severe): Secondary | ICD-10-CM

## 2023-10-11 DIAGNOSIS — Z7984 Long term (current) use of oral hypoglycemic drugs: Secondary | ICD-10-CM | POA: Diagnosis not present

## 2023-10-11 DIAGNOSIS — I10 Essential (primary) hypertension: Secondary | ICD-10-CM

## 2023-10-11 LAB — POCT GLYCOSYLATED HEMOGLOBIN (HGB A1C): Hemoglobin A1C: 9.8 % — AB (ref 4.0–5.6)

## 2023-10-11 LAB — GLUCOSE, CAPILLARY: Glucose-Capillary: 182 mg/dL — ABNORMAL HIGH (ref 70–99)

## 2023-10-11 NOTE — Patient Instructions (Addendum)
 Thank you, [name] for allowing us  to provide your care today. Today we discussed:  For your Diabetes:  - LANTUS  12 units in the morning daily.  -Ozempic  0.5 mg next week Saturday on January 11.   -Then dial the Ozempic  to 1.0 mg:  January 18: Ozempic  1.0 mg  January 25: Ozempic  1.0 mg  February 1: Take 1.0 mg  For your heart and blood pressure -Torsemide  80 mg 2 times daily -BiDil  3 times daily -Farxiga  10 mg 1 time daily -Spironolactone  12.5 mg, half a tablet daily -Bisoprolol  2.5 mg 1 time daily  For your A-fib -Amiodarone  100 mg once daily -Eliquis  2.5 mg 2 times daily  I have ordered the following labs for you:  Lab Orders         Glucose, capillary         POC Hbg A1C      Tests ordered today:  None   Referrals ordered today:   Referral Orders  No referral(s) requested today     I have ordered the following medication/changed the following medications:   Stop the following medications: There are no discontinued medications.   Start the following medications: No orders of the defined types were placed in this encounter.    Follow up:  1 month for DM      Remember:   Should you have any questions or concerns please call the internal medicine clinic at [phone].     Peggy Edwards, DO [facility]

## 2023-10-11 NOTE — Progress Notes (Signed)
 Established Patient Office Visit  Subjective   Patient ID: [name], female    DOB: [birth year]  Age: 73 y.o. MRN: [id number]  Chief Complaint  Patient presents with   Diabetes    Follow up s/p medication changes      HPI  This is a 73 year old female living with a history stated below and presents today for T2DM and HTN. Please see problem based assessment and plan for additional details.    Patient Active Problem List   Diagnosis Date Noted   Stage 4 chronic kidney disease (HCC) 03/02/2023   Acute on chronic HFrEF (heart failure with reduced ejection fraction) (HCC) 03/02/2023   Shortness of breath 03/01/2023   Esophageal dysphagia 10/10/2022   Preventative health care 08/18/2022   CKD (chronic kidney disease) stage 4, GFR 15-29 ml/min (HCC) 07/26/2022   COPD (chronic obstructive pulmonary disease) (HCC) 07/26/2022   Obesity (BMI 30-39.9) 07/26/2022   Paroxysmal atrial flutter (HCC) 07/25/2022   Multiple thyroid  nodules 07/04/2022   Recurrent falls 07/04/2022   Hepatic steatosis 07/28/2020   Hyperlipidemia    Osteoporosis 02/12/2020   Intrinsic atopic dermatitis 09/19/2019   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   Chronic systolic CHF (congestive heart failure) (HCC) 03/18/2016   Spinal stenosis of lumbar region 12/09/2015   Restrictive lung disease 06/24/2015   Diabetic gastroparesis associated with type 2 diabetes mellitus (HCC) 07/12/2014   Depression 05/22/2014   Uncontrolled type 2 diabetes mellitus with hyperglycemia (HCC) 04/03/2012   Tobacco use disorder 10/26/2009   Essential hypertension 05/25/2007   ICD (implantable cardioverter-defibrillator) in place 05/04/2007   Past Medical History:  Diagnosis Date   Acute metabolic encephalopathy 07/26/2022   ATN (acute tubular necrosis) (HCC) 07/15/2014   Automatic implantable cardioverter-defibrillator in situ    Automatic implantable cardioverter-defibrillator in situ 05/04/2007   Qualifier:  Diagnosis of  By: [place]     Blood transfusion without reported diagnosis    Cardiac defibrillator in situ    Atlas II VR (SJM) implanted by Dr Waddell   CHF (congestive heart failure) (HCC)    Chronic combined systolic and diastolic heart failure (HCC)    a. EF 35-40% in past;  b. Echo 7/13:  EF 45-50%, Gr 2 diast dysfn, mild AI, mild MAC, trivial MR, mild LAE, PASP 47.   Chronic ulcer of leg (HCC)    04-09-15 resolved-not a problem.   Colon polyps 04/12/2013   Rectosigmoid polyp   COPD (chronic obstructive pulmonary disease) (HCC)    O2 at night   Depression    Diabetes mellitus    Diabetes mellitus, type 2 (HCC) 04/03/2012   HIGH RISK FEET.. Please have patient take shoes and socks off every visit for visual foot inspection.     Eczema    Elevated alkaline phosphatase level    GGT and 5'nucleotidase 8/13 normal   Health care maintenance 01/22/2013   Surgically absent cervix- no pap needed (Path report 07/2000: uterine body with attached bilateral  adnexa and separate cervix.)   History of oxygen  administration    oxygen  @ 2 l/m nasally bedtime 24/7   HTN (hypertension)    Hx of cardiac cath    a. LHC 2003 normal;  b. LHC 6/13:  Mild calcification in the LM, o/w normal coronary arteries, EF 45%.    Hyperkalemia 08/08/2017   Hyperlipidemia    Elevated triglyceride in 2019   Hyperlipidemia associated with type 2 diabetes mellitus (HCC) 05/25/2007   Qualifier: Diagnosis of  By: Gladis FNP, [place]  Hyperthyroidism, subclinical    HYPERTHYROIDISM, SUBCLINICAL 05/06/2009   Qualifier: Diagnosis of  By: Rolan Hough     Implantable cardioverter-defibrillator (ICD) generator end of life    Lipoma    Need for hepatitis C screening test 04/25/2019   NICM (nonischemic cardiomyopathy) (HCC)    Obesity    On home oxygen  therapy    2L; 24/7 (10/11/2016)   Post menopausal syndrome    Shortness of breath 07/14/2017   Skin rash 07/12/2018   Sleep apnea    pt denies  04/12/2013   Past Surgical History:  Procedure Laterality Date   ABDOMINAL HYSTERECTOMY     CARDIAC CATHETERIZATION     CARDIAC CATHETERIZATION N/A 06/21/2016   Procedure: Left Heart Cath and Coronary Angiography;  Surgeon: Ozell Fell, MD;  Location: [facility] INVASIVE CV LAB;  Service: Cardiovascular;  Laterality: N/A;   CARDIAC DEFIBRILLATOR PLACEMENT  05/04/2007   SJM Atlas II VR ICD   CARDIAC DEFIBRILLATOR PLACEMENT     CATARACT EXTRACTION     od   COLONOSCOPY N/A 04/12/2013   Procedure: COLONOSCOPY;  Surgeon: Belvie JONETTA Just, MD;  Location: [facility];  Service: Endoscopy;  Laterality: N/A;  pt.has defibrilator   COLONOSCOPY N/A 04/16/2015   Procedure: COLONOSCOPY;  Surgeon: Toribio SHAUNNA Cedar, MD;  Location: [facility];  Service: Endoscopy;  Laterality: N/A;   COLONOSCOPY WITH PROPOFOL  N/A 07/21/2023   Procedure: COLONOSCOPY WITH PROPOFOL ;  Surgeon: Just Belvie, MD;  Location: [facility];  Service: Gastroenterology;  Laterality: N/A;   ESOPHAGOGASTRODUODENOSCOPY N/A 04/16/2015   Procedure: ESOPHAGOGASTRODUODENOSCOPY (EGD);  Surgeon: Toribio SHAUNNA Cedar, MD;  Location: [facility];  Service: Endoscopy;  Laterality: N/A;   ESOPHAGOGASTRODUODENOSCOPY (EGD) WITH PROPOFOL  N/A 11/25/2022   Procedure: ESOPHAGOGASTRODUODENOSCOPY (EGD) WITH PROPOFOL ;  Surgeon: Just Belvie, MD;  Location: [facility];  Service: Gastroenterology;  Laterality: N/A;   EYE SURGERY     HERNIA REPAIR     HYSTEROSCOPY     IMPLANTABLE CARDIOVERTER DEFIBRILLATOR (ICD) GENERATOR CHANGE N/A 04/02/2014   Procedure: ICD GENERATOR CHANGE;  Surgeon: Danelle LELON Birmingham, MD;  Location: [facility] CATH LAB;  Service: Cardiovascular;  Laterality: N/A;   INSERT / REPLACE / REMOVE PACEMAKER     LEFT HEART CATHETERIZATION WITH CORONARY ANGIOGRAM N/A 04/02/2012   Procedure: LEFT HEART CATHETERIZATION WITH CORONARY ANGIOGRAM;  Surgeon: Debby JONETTA Como, MD;  Location: [facility] CATH LAB;  Service: Cardiovascular;  Laterality: N/A;   ORIF ANKLE FRACTURE  Right 08/12/2019   Procedure: OPEN REDUCTION INTERNAL FIXATION (ORIF) RIGHT TRIMALLEOLAR ANKLE FRACTURE;  Surgeon: Jerri Kay HERO, MD;  Location: [facility] OR;  Service: Orthopedics;  Laterality: Right;   RIGHT HEART CATH N/A 08/03/2022   Procedure: RIGHT HEART CATH;  Surgeon: Cherrie Toribio SAUNDERS, MD;  Location: [facility] INVASIVE CV LAB;  Service: Cardiovascular;  Laterality: N/A;   SAVORY DILATION N/A 11/25/2022   Procedure: SAVORY DILATION;  Surgeon: Just Belvie, MD;  Location: [facility];  Service: Gastroenterology;  Laterality: N/A;   TEE WITHOUT CARDIOVERSION N/A 06/23/2022   Procedure: TRANSESOPHAGEAL ECHOCARDIOGRAM (TEE);  Surgeon: Loni Soyla LABOR, MD;  Location: [facility] ENDOSCOPY;  Service: Cardiology;  Laterality: N/A;   TONSILLECTOMY     TUBAL LIGATION     Social History   Tobacco Use   Smoking status: Every Day    Current packs/day: 0.50    Average packs/day: 0.5 packs/day for 45.0 years (22.5 ttl pk-yrs)    Types: Cigarettes   Smokeless tobacco: Never   Tobacco comments:    currently smoking  10 cigs per day  Vaping Use   Vaping status: Never Used  Substance Use Topics   Alcohol use: Not Currently    Alcohol/week: 3.0 standard drinks of alcohol    Types: 3 Shots of liquor per week   Drug use: No   Social History   Socioeconomic History   Marital status: Significant Other    Spouse name: Not on file   Number of children: 3   Years of education: 11   Highest education level: 11th grade  Occupational History   Occupation: disabled  Tobacco Use   Smoking status: Every Day    Current packs/day: 0.50    Average packs/day: 0.5 packs/day for 45.0 years (22.5 ttl pk-yrs)    Types: Cigarettes   Smokeless tobacco: Never   Tobacco comments:    currently smoking  10 cigs per day  Vaping Use   Vaping status: Never Used  Substance and Sexual Activity   Alcohol use: Not Currently    Alcohol/week: 3.0 standard drinks of alcohol    Types: 3 Shots of liquor per week   Drug use: No    Sexual activity: Yes    Partners: Male    Birth control/protection: Surgical  Other Topics Concern   Not on file  Social History Narrative   ** Merged History Encounter **       Married   Patient enjoys going to [employer], church and spending time with family   Social Drivers of Corporate Investment Banker Strain: Low Risk  (12/09/2022)   Overall Financial Resource Strain (CARDIA)    Difficulty of Paying Living Expenses: Not hard at all  Food Insecurity: No Food Insecurity (12/09/2022)   Hunger Vital Sign    Worried About Running Out of Food in the Last Year: Never true    Ran Out of Food in the Last Year: Never true  Transportation Needs: No Transportation Needs (12/09/2022)   PRAPARE - Administrator, Civil Service (Medical): No    Lack of Transportation (Non-Medical): No  Physical Activity: Insufficiently Active (12/09/2022)   Exercise Vital Sign    Days of Exercise per Week: 2 days    Minutes of Exercise per Session: 10 min  Stress: Stress Concern Present (12/09/2022)   [employer] of Occupational Health - Occupational Stress Questionnaire    Feeling of Stress : To some extent  Social Connections: Moderately Integrated (12/09/2022)   Social Connection and Isolation Panel [NHANES]    Frequency of Communication with Friends and Family: More than three times a week    Frequency of Social Gatherings with Friends and Family: More than three times a week    Attends Religious Services: More than 4 times per year    Active Member of [employer] or Organizations: Yes    Attends Banker Meetings: Never    Marital Status: Widowed  Intimate Partner Violence: Not At Risk (12/09/2022)   Humiliation, Afraid, Rape, and Kick questionnaire    Fear of Current or Ex-Partner: No    Emotionally Abused: No    Physically Abused: No    Sexually Abused: No   Family Status  Relation Name Status   Mother  Deceased   Father  Deceased   Sister  (Not Specified)   Mat Aunt  Deceased    Mat Uncle  Deceased   Pat Aunt  Deceased   Pat Uncle  Deceased   Mat Aunt  Deceased   Mat Uncle  Deceased   MGF  Deceased   MGM  Deceased   PGM  Deceased   PGF  Deceased   Daughter  (Not Specified)   Cousin  (Not Specified)   GChild  (Not Specified)   Neg Hx  (Not Specified)  No partnership data on file   Family History  Problem Relation Age of Onset   Stroke Mother    Seizures Father    Heart disease Father    Diabetes Sister    Asthma Maternal Aunt        aunts   Asthma Maternal Uncle        uncles   Heart disease Paternal Aunt        aunts   Heart disease Paternal Uncle        uncles   Heart disease Maternal Aunt        aunts   Heart disease Maternal Uncle        uncles   Heart disease Maternal Grandfather    Breast cancer Daughter    Breast cancer Cousin    Asthma Grandchild    Colon cancer Neg Hx    Colon polyps Neg Hx    Esophageal cancer Neg Hx    Kidney disease Neg Hx    Gallbladder disease Neg Hx    Allergies  Allergen Reactions   Actos [Pioglitazone] Other (See Comments)    congestive heart failure    Naproxen  Other (See Comments)    500mg  dose made her sleep for two days   Rosiglitazone Hives   Metformin  And Related     Caused kidney problems    Tomato Itching   Hydrocodone -Acetaminophen  Itching   Hydrocortisone Hives and Itching   ROS   ROS negative except for what is noted on the assessment and plan.  Objective:     BP 120/69 (BP Location: Right Arm, Patient Position: Sitting, Cuff Size: Normal)   Pulse 92   Temp 98.4 F (36.9 C) (Oral)   Ht 5' 2 (1.575 m)   Wt 173 lb 9.6 oz (78.7 kg)   SpO2 96%   BMI 31.75 kg/m  BP Readings from Last 3 Encounters:  10/11/23 120/69  08/30/23 (!) 102/54  08/29/23 101/64   Wt Readings from Last 3 Encounters:  10/11/23 173 lb 9.6 oz (78.7 kg)  08/30/23 172 lb (78 kg)  08/29/23 172 lb 3.2 oz (78.1 kg)   SpO2 Readings from Last 3 Encounters:  10/11/23 96%  08/30/23 93%  08/29/23 98%       Physical Exam  General: Sitting in chair, no acute distress Cardiovascular: Regular rate, no murmurs/rubs/gallops.  No lower extremity edema bilaterally. Pulmonary: CTA bilaterally, no wheezing or crackles. Abdomen: Soft, nontender, nondistended, bowel sounds present. MSK: Range of motion intact.  Results for orders placed or performed in visit on 10/11/23  Glucose, capillary  Result Value Ref Range   Glucose-Capillary 182 (H) 70 - 99 mg/dL  POC Hbg J8R  Result Value Ref Range   Hemoglobin A1C 9.8 (A) 4.0 - 5.6 %   HbA1c POC (<> result, manual entry)     HbA1c, POC (prediabetic range)     HbA1c, POC (controlled diabetic range)      Last CBC Lab Results  Component Value Date   WBC 8.9 08/30/2023   HGB 12.6 08/30/2023   HCT 39.7 08/30/2023   MCV 84.3 08/30/2023   MCH 26.8 08/30/2023   RDW 14.4 08/30/2023   PLT 237 08/30/2023   Last metabolic panel Lab Results  Component Value Date   GLUCOSE 146 (H)  09/26/2023   NA 141 09/26/2023   K 4.7 09/26/2023   CL 103 09/26/2023   CO2 27 09/26/2023   BUN 35 (H) 09/26/2023   CREATININE 1.91 (H) 09/26/2023   GFRNONAA 28 (L) 09/26/2023   CALCIUM  9.6 09/26/2023   PHOS 4.5 06/16/2021   PROT 7.3 08/14/2023   ALBUMIN  3.5 08/14/2023   LABGLOB 3.2 06/27/2022   AGRATIO 0.7 06/27/2022   BILITOT 0.6 08/14/2023   ALKPHOS 117 08/14/2023   AST 18 08/14/2023   ALT 14 08/14/2023   ANIONGAP 11 09/26/2023   Last lipids Lab Results  Component Value Date   CHOL 145 01/10/2023   HDL 59 01/10/2023   LDLCALC 72 01/10/2023   LDLDIRECT 122.3 03/06/2007   TRIG 70 01/10/2023   CHOLHDL 2.5 01/10/2023   Last hemoglobin A1c Lab Results  Component Value Date   HGBA1C 9.8 (A) 10/11/2023   Last thyroid  functions Lab Results  Component Value Date   TSH 1.876 08/14/2023   Last vitamin D  No results found for: 25OHVITD2, 25OHVITD3, VD25OH Last vitamin B12 and Folate Lab Results  Component Value Date   VITAMINB12 1,377 (H)  06/27/2022     Lab Results  Component Value Date   CHOL 145 01/10/2023   HDL 59 01/10/2023   LDLCALC 72 01/10/2023   LDLDIRECT 122.3 03/06/2007   TRIG 70 01/10/2023   CHOLHDL 2.5 01/10/2023    The 89-bzjm ASCVD risk score (Arnett DK, et al., 2019) is: 32.9%    Assessment & Plan:  Patient is discussed with Dr. Forest  Problem List Items Addressed This Visit       Cardiovascular and Mediastinum   Essential hypertension (Chronic)   BP Readings from Last 3 Encounters:  10/11/23 120/69  08/30/23 (!) 102/54  08/29/23 101/64   Denies any chest pain, shortness, dizziness, dizziness.  No lower extremity edema bilaterally.  Medication regimen as per heart failure      Chronic systolic CHF (congestive heart failure) (HCC) (Chronic)   HFrEF 10/23 echo with EF of 20-30% with ICD in place.  Patient follow-up with heart cardiology on 08/30/2023, current regimen includes torsemide  80 mg twice daily, BiDil  1 tab 3 times daily, Farxiga  10 mg daily, spironolactone  12.5 mg daily, bisoprolol  12.5 mg daily. Dry weight is approximately 171lb, weight today is 173 pounds.  No lower extremity edema bilaterally.  -Patient is advised continue current medication regimen as per cardiology - Continue torsemide  80 mg bid  - Continue BiDil  1 tab tid. - Continue Farxiga  10 mg daily. - Continue spiro 12.5 mg daily. - Continue Bisoprolol  2.5 mg daily       Paroxysmal atrial flutter (HCC) (Chronic)   - Continue low dose amio 100 mg daily.  - Continue Eliquis  2.5 mg bid.        Digestive   Diabetic gastroparesis associated with type 2 diabetes mellitus (HCC)   Lab Results  Component Value Date   HGBA1C 9.8 (A) 10/11/2023   Current medications are Ozempic  0.25 mg weekly, Tresiba  10 units in the morning, Farxiga  10 mg daily.  Per last office note, she was supposed to increase Ozempic  to 1 mg weekly and take Tresiba  10 units nightly.  Patient reports that she is only using the Ozempic  0.25 mg once a  week, she has not increased the dose to 1 mg.  States that she still has the leftover Ozempic  pens of 0.25 mg, she was to complete the course.  A1c for the last office visit was 9.2 noted on  08/01/2023, rechecked during this visit is 9.8, increased.  She denies any symptoms of dizziness and lightheaded any tremors.  Plan: -Patient was advised to increase Ozempic  0.50 mg upcoming Saturday, increase to 1 mg 3 Saturdays.  Patient is advised to continue Tresiba  10 units at night and Farxiga  10 mg daily.   -Recheck A1c at next office visit.       Relevant Orders   POC Hbg A1C (Completed)   Glucose, capillary (Completed)     Endocrine   Uncontrolled type 2 diabetes mellitus with hyperglycemia (HCC) - Primary   Relevant Orders   POC Hbg A1C (Completed)     Genitourinary   CKD (chronic kidney disease) stage 4, GFR 15-29 ml/min (HCC) (Chronic)   Lab Results  Component Value Date   CREATININE 1.91 (H) 09/26/2023   Patient is currently taking Farxiga  10 mg daily. Cr 1.91, GFR 28.          Return in about 1 month (around 11/11/2023) for DM .    [name], DO

## 2023-10-13 NOTE — Assessment & Plan Note (Signed)
 Lab Results  Component Value Date   CREATININE 1.91 (H) 09/26/2023   Patient is currently taking Farxiga 10 mg daily. Cr 1.91, GFR 28.

## 2023-10-13 NOTE — Assessment & Plan Note (Signed)
 BP Readings from Last 3 Encounters:  10/11/23 120/69  08/30/23 (!) 102/54  08/29/23 101/64   Denies any chest pain, shortness, dizziness, dizziness.  No lower extremity edema bilaterally.  Medication regimen as per heart failure

## 2023-10-13 NOTE — Assessment & Plan Note (Signed)
 HFrEF 10/23 echo with EF of 20-30% with ICD in place.  Patient follow-up with heart cardiology on 08/30/2023, current regimen includes torsemide  80 mg twice daily, BiDil  1 tab 3 times daily, Farxiga  10 mg daily, spironolactone  12.5 mg daily, bisoprolol  12.5 mg daily. Dry weight is approximately 171lb, weight today is 173 pounds.  No lower extremity edema bilaterally.  -Patient is advised continue current medication regimen as per cardiology - Continue torsemide  80 mg bid  - Continue BiDil  1 tab tid. - Continue Farxiga  10 mg daily. - Continue spiro 12.5 mg daily. - Continue Bisoprolol  2.5 mg daily

## 2023-10-13 NOTE — Assessment & Plan Note (Signed)
-   Continue low dose amio 100 mg daily.  - Continue Eliquis 2.5 mg bid.

## 2023-10-13 NOTE — Assessment & Plan Note (Signed)
 Lab Results  Component Value Date   HGBA1C 9.8 (A) 10/11/2023   Current medications are Ozempic  0.25 mg weekly, Tresiba  10 units in the morning, Farxiga  10 mg daily.  Per last office note, she was supposed to increase Ozempic  to 1 mg weekly and take Tresiba  10 units nightly.  Patient reports that she is only using the Ozempic  0.25 mg once a week, she has not increased the dose to 1 mg.  States that she still has the leftover Ozempic  pens of 0.25 mg, she was to complete the course.  A1c for the last office visit was 9.2 noted on 08/01/2023, rechecked during this visit is 9.8, increased.  She denies any symptoms of dizziness and lightheaded any tremors.  Plan: -Patient was advised to increase Ozempic  0.50 mg upcoming Saturday, increase to 1 mg 3 Saturdays.  Patient is advised to continue Tresiba  10 units at night and Farxiga  10 mg daily.   -Recheck A1c at next office visit.

## 2023-10-16 DIAGNOSIS — L309 Dermatitis, unspecified: Secondary | ICD-10-CM | POA: Diagnosis not present

## 2023-10-16 DIAGNOSIS — L218 Other seborrheic dermatitis: Secondary | ICD-10-CM | POA: Diagnosis not present

## 2023-10-16 DIAGNOSIS — L2089 Other atopic dermatitis: Secondary | ICD-10-CM | POA: Diagnosis not present

## 2023-10-17 NOTE — Addendum Note (Signed)
 Addended by: Burnell Blanks on: 10/17/2023 10:13 AM   Modules accepted: Level of Service

## 2023-10-17 NOTE — Progress Notes (Signed)
 Internal Medicine Clinic Attending  Case discussed with the resident at the time of the visit.  We reviewed the resident's history and exam and pertinent patient test results.  I agree with the assessment, diagnosis, and plan of care documented in the resident's note.

## 2023-10-30 DIAGNOSIS — I509 Heart failure, unspecified: Secondary | ICD-10-CM | POA: Diagnosis not present

## 2023-10-30 DIAGNOSIS — J449 Chronic obstructive pulmonary disease, unspecified: Secondary | ICD-10-CM | POA: Diagnosis not present

## 2023-11-02 DIAGNOSIS — H04123 Dry eye syndrome of bilateral lacrimal glands: Secondary | ICD-10-CM | POA: Diagnosis not present

## 2023-11-02 DIAGNOSIS — H2512 Age-related nuclear cataract, left eye: Secondary | ICD-10-CM | POA: Diagnosis not present

## 2023-11-02 DIAGNOSIS — H524 Presbyopia: Secondary | ICD-10-CM | POA: Diagnosis not present

## 2023-11-09 ENCOUNTER — Telehealth (HOSPITAL_COMMUNITY): Payer: Self-pay | Admitting: Internal Medicine

## 2023-11-10 ENCOUNTER — Encounter (HOSPITAL_COMMUNITY): Payer: Self-pay | Admitting: Internal Medicine

## 2023-11-10 ENCOUNTER — Ambulatory Visit (HOSPITAL_COMMUNITY)
Admission: RE | Admit: 2023-11-10 | Discharge: 2023-11-10 | Disposition: A | Discharge status: Home / Self Care | Payer: 59 | Source: Ambulatory Visit | Attending: Internal Medicine | Admitting: Internal Medicine

## 2023-11-10 VITALS — BP 92/58 | HR 83 | Wt 178.8 lb

## 2023-11-10 DIAGNOSIS — Z9981 Dependence on supplemental oxygen: Secondary | ICD-10-CM | POA: Diagnosis not present

## 2023-11-10 DIAGNOSIS — E1122 Type 2 diabetes mellitus with diabetic chronic kidney disease: Secondary | ICD-10-CM | POA: Diagnosis not present

## 2023-11-10 DIAGNOSIS — Z7984 Long term (current) use of oral hypoglycemic drugs: Secondary | ICD-10-CM | POA: Insufficient documentation

## 2023-11-10 DIAGNOSIS — Z794 Long term (current) use of insulin: Secondary | ICD-10-CM | POA: Insufficient documentation

## 2023-11-10 DIAGNOSIS — Z6832 Body mass index (BMI) 32.0-32.9, adult: Secondary | ICD-10-CM | POA: Insufficient documentation

## 2023-11-10 DIAGNOSIS — I4892 Unspecified atrial flutter: Secondary | ICD-10-CM

## 2023-11-10 DIAGNOSIS — Z95810 Presence of automatic (implantable) cardiac defibrillator: Secondary | ICD-10-CM | POA: Insufficient documentation

## 2023-11-10 DIAGNOSIS — Z7985 Long-term (current) use of injectable non-insulin antidiabetic drugs: Secondary | ICD-10-CM | POA: Insufficient documentation

## 2023-11-10 DIAGNOSIS — I5022 Chronic systolic (congestive) heart failure: Secondary | ICD-10-CM | POA: Insufficient documentation

## 2023-11-10 DIAGNOSIS — F17210 Nicotine dependence, cigarettes, uncomplicated: Secondary | ICD-10-CM | POA: Insufficient documentation

## 2023-11-10 DIAGNOSIS — Z833 Family history of diabetes mellitus: Secondary | ICD-10-CM | POA: Diagnosis not present

## 2023-11-10 DIAGNOSIS — I130 Hypertensive heart and chronic kidney disease with heart failure and stage 1 through stage 4 chronic kidney disease, or unspecified chronic kidney disease: Secondary | ICD-10-CM | POA: Insufficient documentation

## 2023-11-10 DIAGNOSIS — Z79899 Other long term (current) drug therapy: Secondary | ICD-10-CM | POA: Diagnosis not present

## 2023-11-10 DIAGNOSIS — I428 Other cardiomyopathies: Secondary | ICD-10-CM | POA: Diagnosis not present

## 2023-11-10 DIAGNOSIS — Z7901 Long term (current) use of anticoagulants: Secondary | ICD-10-CM | POA: Insufficient documentation

## 2023-11-10 DIAGNOSIS — Z720 Tobacco use: Secondary | ICD-10-CM | POA: Diagnosis not present

## 2023-11-10 DIAGNOSIS — Z8249 Family history of ischemic heart disease and other diseases of the circulatory system: Secondary | ICD-10-CM | POA: Diagnosis not present

## 2023-11-10 DIAGNOSIS — N184 Chronic kidney disease, stage 4 (severe): Secondary | ICD-10-CM | POA: Insufficient documentation

## 2023-11-10 DIAGNOSIS — I480 Paroxysmal atrial fibrillation: Secondary | ICD-10-CM | POA: Diagnosis not present

## 2023-11-10 DIAGNOSIS — E669 Obesity, unspecified: Secondary | ICD-10-CM | POA: Insufficient documentation

## 2023-11-10 DIAGNOSIS — J449 Chronic obstructive pulmonary disease, unspecified: Secondary | ICD-10-CM | POA: Insufficient documentation

## 2023-11-10 LAB — COMPREHENSIVE METABOLIC PANEL
ALT: 12 U/L (ref 0–44)
AST: 13 U/L — ABNORMAL LOW (ref 15–41)
Albumin: 3.5 g/dL (ref 3.5–5.0)
Alkaline Phosphatase: 106 U/L (ref 38–126)
Anion gap: 12 (ref 5–15)
BUN: 33 mg/dL — ABNORMAL HIGH (ref 8–23)
CO2: 25 mmol/L (ref 22–32)
Calcium: 9.3 mg/dL (ref 8.9–10.3)
Chloride: 103 mmol/L (ref 98–111)
Creatinine, Ser: 1.92 mg/dL — ABNORMAL HIGH (ref 0.44–1.00)
GFR, Estimated: 28 mL/min — ABNORMAL LOW (ref >60)
Glucose, Bld: 143 mg/dL — ABNORMAL HIGH (ref 70–99)
Potassium: 4.1 mmol/L (ref 3.5–5.1)
Sodium: 140 mmol/L (ref 135–145)
Total Bilirubin: 0.7 mg/dL (ref 0.0–1.2)
Total Protein: 7 g/dL (ref 6.5–8.1)

## 2023-11-10 LAB — CBC
HCT: 41.3 % (ref 36.0–46.0)
Hemoglobin: 13 g/dL (ref 12.0–15.0)
MCH: 26.1 pg (ref 26.0–34.0)
MCHC: 31.5 g/dL (ref 30.0–36.0)
MCV: 82.9 fL (ref 80.0–100.0)
Platelets: 297 10*3/uL (ref 150–400)
RBC: 4.98 MIL/uL (ref 3.87–5.11)
RDW: 15.1 % (ref 11.5–15.5)
WBC: 8.7 10*3/uL (ref 4.0–10.5)
nRBC: 0 % (ref 0.0–0.2)

## 2023-11-10 LAB — BRAIN NATRIURETIC PEPTIDE: B Natriuretic Peptide: 20.8 pg/mL (ref 0.0–100.0)

## 2023-11-10 LAB — TSH: TSH: 1.44 u[IU]/mL (ref 0.350–4.500)

## 2023-11-10 MED ORDER — APIXABAN 5 MG PO TABS: 5.0000 mg | ORAL_TABLET | Freq: Two times a day (BID) | ORAL | 2 refills | Status: DC

## 2023-11-10 NOTE — Addendum Note (Signed)
 Encounter addended by: Tavaria Mackins M, RN on: 11/10/2023 12:51 PM  Actions taken: Visit diagnoses modified, Order list changed, Diagnosis association updated, Clinical Note Signed, Charge Capture section accepted

## 2023-11-10 NOTE — Progress Notes (Signed)
 [facility] NOTE   PCP: [name], MD HF Cardiologist:  Dr. Cherrie   HPI: Ms Coventry is a 73 y.o. with a history of DM2, COPD, HTN, chronic respiratory failure, tobacco abuse, chronic systolic s/p St Jude ICD, EF has 25-30% for at least 5 years. Last LHC 2017, normal coronaries.    Admitted 9/23 with sepsis 2/2 cellulitis of RLE. Underwent TEE to r/o endocarditis. TEE (9/23) showed LVEF 30%, moderate TR, no valvular vegetation or device vegetation. Admitted a couple weeks later with hypothermia and hypoglycemia. Insulin  stopped and SGLT2i continued. Entresto  and spiro held due to low BP.   Admitted 10/23 with A/C HFrEF, AECOPD, and new onset A flutter RVR. Given steroinds + inhalers. Started on amio drip + heparin  drip. Diuresed with IV lasix .  Hospital course complicated by AKI. Renal US  negative for hydronephrosis. Had RHC prior to discharge. Low filing pressures and CI 2.5. Discharge weight 197 pounds.   Seen in ED 03/09/23 with SOB. BNP normal, felt to be COPD exac, improved after duonebs.  Seen in clinic in 8/24. Volume overloaded. Furoscix  prescribed. Diuresed well.   Today she returns for AHF follow up. Says she feels good. Denies CP or SOB. Says she goes out to the store a lot and walks around without any problem. Just takes her time. No CP or dizziness. Occasional LE edema. Takes extra diuretic as needed. Still smoking 3-4 cigs/day.   Cardiac Testing  - RHC (11/23):  RA = 9 RV = 55/10 PA = 44/11 (23) PCW = 9 Fick cardiac output/index = 6.1/3.2 Thermo CO/CI = 4.8/2.5 PVR = 2.3 (Fick) 2.9 (TD) Ao sat = 95% PA sat = 67%, 67% PAPi = 5.0  - Echo (10/23): EF 25-30%, severe LV dysfunction, RV low/normal, mild to moderate MR with moderate MAC, severe TR - TEE (9/23): EF 30%, moderate TR, no valvular vegetation or device vegetation.  - Echo (10/23): EF 25-30% RV low normal. RA/LA severely dilated. D Sahped septum - Echo (9/22): EF 20-25% RV normal , LA severely reduced   - Echo (2018): EF 25-30% Grade IDD - LHC 2017 Normal Coronaries.    ROS: All systems negative except as listed in HPI, PMH and Problem List.  SH:  Social History   Socioeconomic History   Marital status: Significant Other    Spouse name: Not on file   Number of children: 3   Years of education: 40   Highest education level: 11th grade  Occupational History   Occupation: disabled  Tobacco Use   Smoking status: Every Day    Current packs/day: 0.50    Average packs/day: 0.5 packs/day for 45.0 years (22.5 ttl pk-yrs)    Types: Cigarettes   Smokeless tobacco: Never   Tobacco comments:    currently smoking  10 cigs per day  Vaping Use   Vaping status: Never Used  Substance and Sexual Activity   Alcohol use: Not Currently    Alcohol/week: 3.0 standard drinks of alcohol    Types: 3 Shots of liquor per week   Drug use: No   Sexual activity: Yes    Partners: Male    Birth control/protection: Surgical  Other Topics Concern   Not on file  Social History Narrative   ** Merged History Encounter **       Married   Patient enjoys going to [employer], church and spending time with family   Social Drivers of Health   Financial Resource Strain: Low Risk  (12/09/2022)  Overall Financial Resource Strain (CARDIA)    Difficulty of Paying Living Expenses: Not hard at all  Food Insecurity: No Food Insecurity (12/09/2022)   Hunger Vital Sign    Worried About Running Out of Food in the Last Year: Never true    Ran Out of Food in the Last Year: Never true  Transportation Needs: No Transportation Needs (12/09/2022)   PRAPARE - Administrator, Civil Service (Medical): No    Lack of Transportation (Non-Medical): No  Physical Activity: Insufficiently Active (12/09/2022)   Exercise Vital Sign    Days of Exercise per Week: 2 days    Minutes of Exercise per Session: 10 min  Stress: Stress Concern Present (12/09/2022)   [employer] of Occupational Health - Occupational Stress  Questionnaire    Feeling of Stress : To some extent  Social Connections: Moderately Integrated (12/09/2022)   Social Connection and Isolation Panel [NHANES]    Frequency of Communication with Friends and Family: More than three times a week    Frequency of Social Gatherings with Friends and Family: More than three times a week    Attends Religious Services: More than 4 times per year    Active Member of [employer] or Organizations: Yes    Attends Banker Meetings: Never    Marital Status: Widowed  Intimate Partner Violence: Not At Risk (12/09/2022)   Humiliation, Afraid, Rape, and Kick questionnaire    Fear of Current or Ex-Partner: No    Emotionally Abused: No    Physically Abused: No    Sexually Abused: No   FH:  Family History  Problem Relation Age of Onset   Stroke Mother    Seizures Father    Heart disease Father    Diabetes Sister    Asthma Maternal Aunt        aunts   Asthma Maternal Uncle        uncles   Heart disease Paternal Aunt        aunts   Heart disease Paternal Uncle        uncles   Heart disease Maternal Aunt        aunts   Heart disease Maternal Uncle        uncles   Heart disease Maternal Grandfather    Breast cancer Daughter    Breast cancer Cousin    Asthma Grandchild    Colon cancer Neg Hx    Colon polyps Neg Hx    Esophageal cancer Neg Hx    Kidney disease Neg Hx    Gallbladder disease Neg Hx    Past Medical History:  Diagnosis Date   Acute metabolic encephalopathy 07/26/2022   ATN (acute tubular necrosis) (HCC) 07/15/2014   Automatic implantable cardioverter-defibrillator in situ    Automatic implantable cardioverter-defibrillator in situ 05/04/2007   Qualifier: Diagnosis of  By: [place]     Blood transfusion without reported diagnosis    Cardiac defibrillator in situ    Atlas II VR (SJM) implanted by Dr Waddell   CHF (congestive heart failure) (HCC)    Chronic combined systolic and diastolic heart failure (HCC)    a. EF  35-40% in past;  b. Echo 7/13:  EF 45-50%, Gr 2 diast dysfn, mild AI, mild MAC, trivial MR, mild LAE, PASP 47.   Chronic ulcer of leg (HCC)    04-09-15 resolved-not a problem.   Colon polyps 04/12/2013   Rectosigmoid polyp   COPD (chronic obstructive pulmonary disease) (HCC)  O2 at night   Depression    Diabetes mellitus    Diabetes mellitus, type 2 (HCC) 04/03/2012   HIGH RISK FEET.. Please have patient take shoes and socks off every visit for visual foot inspection.     Eczema    Elevated alkaline phosphatase level    GGT and 5'nucleotidase 8/13 normal   Health care maintenance 01/22/2013   Surgically absent cervix- no pap needed (Path report 07/2000: uterine body with attached bilateral  adnexa and separate cervix.)   History of oxygen  administration    oxygen  @ 2 l/m nasally bedtime 24/7   HTN (hypertension)    Hx of cardiac cath    a. LHC 2003 normal;  b. LHC 6/13:  Mild calcification in the LM, o/w normal coronary arteries, EF 45%.    Hyperkalemia 08/08/2017   Hyperlipidemia    Elevated triglyceride in 2019   Hyperlipidemia associated with type 2 diabetes mellitus (HCC) 05/25/2007   Qualifier: Diagnosis of  By: Gladis FNP, [place]     Hyperthyroidism, subclinical    HYPERTHYROIDISM, SUBCLINICAL 05/06/2009   Qualifier: Diagnosis of  By: Rolan Hough     Implantable cardioverter-defibrillator (ICD) generator end of life    Lipoma    Need for hepatitis C screening test 04/25/2019   NICM (nonischemic cardiomyopathy) (HCC)    Obesity    On home oxygen  therapy    2L; 24/7 (10/11/2016)   Post menopausal syndrome    Shortness of breath 07/14/2017   Skin rash 07/12/2018   Sleep apnea    pt denies 04/12/2013   Current Outpatient Medications  Medication Sig Dispense Refill   Accu-Chek Softclix Lancets lancets Check BG as directed. 100 each 3   albuterol  (VENTOLIN  HFA) 108 (90 Base) MCG/ACT inhaler Inhale 2 puffs into the lungs every 6 (six) hours as needed for wheezing or  shortness of breath. 8 g 2   amiodarone  (PACERONE ) 200 MG tablet Take 0.5 tablets (100 mg total) by mouth daily. 45 tablet 2   ANORO ELLIPTA  62.5-25 MCG/ACT AEPB INHALE 1 PUFF INTO THE LUNGS DAILY 60 each 2   apixaban  (ELIQUIS ) 2.5 MG TABS tablet Take 1 tablet (2.5 mg total) by mouth 2 (two) times daily. 60 tablet 2   aspirin  EC 81 MG tablet Take 81 mg by mouth daily. Swallow whole.     atorvastatin  (LIPITOR ) 80 MG tablet Take 80 mg by mouth at bedtime.     bisoprolol  (ZEBETA ) 5 MG tablet Take 0.5 tablets (2.5 mg total) by mouth daily. 15 tablet 11   calcium -vitamin D  (OSCAL WITH D) 500-200 MG-UNIT tablet Take 1 tablet by mouth 2 (two) times daily. 90 tablet 6   Continuous Glucose Receiver (DEXCOM G7 RECEIVER) DEVI Place new sensor every 10 days, use to monitor blood sugar continuously 1 each 0   Continuous Glucose Sensor (DEXCOM G7 SENSOR) MISC Place new sensor every 10 days. Use to monitor blood sugar continuously. 9 each 3   dapagliflozin  propanediol (FARXIGA ) 10 MG TABS tablet Take 1 tablet (10 mg total) by mouth daily. 90 tablet 1   Insulin  Degludec (TRESIBA  [place]) Inject 10 Units into the skin daily.     Insulin  Pen Needle (PEN NEEDLES) 32G X 4 MM MISC 1 Needle by Does not apply route daily. 100 each 1   isosorbide -hydrALAZINE  (BIDIL ) 20-37.5 MG tablet Take 1 tablet by mouth 3 (three) times daily. 90 tablet 3   Lancets Misc. (ACCU-CHEK SOFTCLIX LANCET DEV) KIT Check BG as directed. 1 kit 1  levocetirizine (XYZAL ) 5 MG tablet Take 5 mg by mouth at bedtime.     OXYGEN  Inhale 2 L into the lungs at bedtime.     potassium chloride  SA (KLOR-CON  M) 20 MEQ tablet TAKE 1 TABLET(20 MEQ) BY MOUTH DAILY 90 tablet 0   potassium chloride  SA (KLOR-CON  M) 20 MEQ tablet Take 20 mEq by mouth 2 (two) times daily.     Semaglutide  (OZEMPIC , 1 MG/DOSE, [place]) Inject 1 mg into the skin once a week.     spironolactone  (ALDACTONE ) 25 MG tablet Take 0.5 tablets (12.5 mg total) by mouth daily. 45 tablet 3   torsemide   (DEMADEX ) 20 MG tablet Take 80 mg by mouth 2 (two) times daily.     triamcinolone  ointment (KENALOG ) 0.1 % Apply 1 Application topically 2 (two) times daily. Mix with Eucerin cream     No current facility-administered medications for this encounter.   BP (!) 92/58   Pulse 83   Wt 81.1 kg (178 lb 12.8 oz)   SpO2 95%   BMI 32.70 kg/m   Wt Readings from Last 3 Encounters:  11/10/23 81.1 kg (178 lb 12.8 oz)  10/11/23 78.7 kg (173 lb 9.6 oz)  08/30/23 78 kg (172 lb)   PHYSICAL EXAM: General: Elderly woman in WC. No resp difficulty HEENT: normal Neck: supple. no JVD. Carotids 2+ bilat; no bruits. No lymphadenopathy or thryomegaly appreciated. Cor: PMI nondisplaced. Regular rate & rhythm. No rubs, gallops or murmurs. Lungs: clear Abdomen: obese soft, nontender, nondistended. No hepatosplenomegaly. No bruits or masses. Good bowel sounds. Extremities: no cyanosis, clubbing, rash, edema Neuro: alert & orientedx3, cranial nerves grossly intact. moves all 4 extremities w/o difficulty. Affect pleasant  Device interrogation :No VT/AF. Volume ok Personally reviewed  ASSESSMENT & PLAN: 1. Chronic HFrEF  - NICM LHC 2017 normal cors. - Echo (2022):EF 25-30% which has been down for many years.  - Echo (10/23): EF 25-30%, D shaped LV, RV mildly dilated w/ mod elevated RVSP, RV systolic fx mildly reduced, severe BAE, severe TR, mild-mod MR, IVC dilated, assumed RAP 15  - RHC (11/23): well-compensated filling pressures, normal CO and mild PH. - s/p STJ ICD - NYHA II-III. Volume ok on exam and ICD - Continue bisoprolol  2.5  - Continue torsemide  80 mg bid Takes extra as needed - Continue BiDil  1 tab tid. - Continue Farxiga  10 mg daily. - Continue spiro 12.5 mg daily. - BP too low to titrate  - Labs today - Due for repeat echo  2. COPD/Tobacco Abuse - Still smoking 3-4 cigs/day  - Discussed need for cessation - Says she wears her O2 at home - Does not want to be referred back to  pulmonologist, states she would not go  3. PAF - Continue low dose amio 100 mg daily. In NSR tday - She is on Eliquis  2.5 bid. She should be on 5 bid. I will change - She says she will not wear CPAP, so will not pursue sleep study. - Labs today including amio labs   4. CKD IV - Renal u/s (10/23) no hydronephrosis - Baseline SCr 2-3 but more recently 1.9-2.1 - Continue Farxiga  - Labs today     5.  DMII - A1c 9.2 (10/24) - On SGLT2i + statin. - Management per PCP.  6. Obesity - Body mass index is 32.7 kg/m. - She is on Ozempic . - Managed by PCP   7. HTN - BP running on low end today - continue to follow   Marabella Popiel,  MD  12:41 PM

## 2023-11-10 NOTE — Patient Instructions (Signed)
 Good to see you today!  INCREASE Eliquis  to 5 mg Twice daily  Labs done today, your results will be available in MyChart, we will contact you for abnormal readings.  Your physician has requested that you have an echocardiogram. Echocardiography is a painless test that uses sound waves to create images of your heart. It provides your doctor with information about the size and shape of your heart and how well your heart's chambers and valves are working. This procedure takes approximately one hour. There are no restrictions for this procedure. Please do NOT wear cologne, perfume, aftershave, or lotions (deodorant is allowed). Please arrive 15 minutes prior to your appointment time.  Please note: We ask at that you not bring children with you during ultrasound (echo/ vascular) testing. Due to room size and safety concerns, children are not allowed in the ultrasound rooms during exams. Our front office staff cannot provide observation of children in our lobby area while testing is being conducted. An adult accompanying a patient to their appointment will only be allowed in the ultrasound room at the discretion of the ultrasound technician under special circumstances. We apologize for any inconvenience.  Your physician recommends that you schedule a follow-up appointment in:  6 months(August) Call office in June to schedule an appointment  If you have any questions or concerns before your next appointment please send us  a message through [place] or call our office at [phone].    TO LEAVE A MESSAGE FOR THE NURSE SELECT OPTION 2, PLEASE LEAVE A MESSAGE INCLUDING: YOUR NAME DATE OF BIRTH CALL BACK NUMBER REASON FOR CALL**this is important as we prioritize the call backs  YOU WILL RECEIVE A CALL BACK THE SAME DAY AS LONG AS YOU CALL BEFORE 4:00 PM  At the [facility], you and your health needs are our priority. As part of our continuing mission to provide you with exceptional  heart care, we have created designated Provider Care Teams. These Care Teams include your primary Cardiologist (physician) and Advanced Practice Providers (APPs- Physician Assistants and Nurse Practitioners) who all work together to provide you with the care you need, when you need it.   You may see any of the following providers on your designated Care Team at your next follow up: Dr Toribio Fuel Dr Ezra Shuck Dr. Ria Commander Dr. Morene Brownie Amy Lenetta, NP Caffie Shed, [place] [facility] [place], [place] Beckey Coe, NP Jordan Lee, NP Tinnie Redman, PharmD   Please be sure to bring in all your medications bottles to every appointment.    Thank you for choosing [facility]

## 2023-11-12 LAB — T3: T3, Total: 102 ng/dL (ref 71–180)

## 2023-11-12 LAB — T4: T4, Total: 9.4 ug/dL (ref 4.5–12.0)

## 2023-11-13 ENCOUNTER — Other Ambulatory Visit: Payer: Self-pay | Admitting: Student

## 2023-11-14 ENCOUNTER — Other Ambulatory Visit: Payer: Self-pay | Admitting: Student

## 2023-11-14 ENCOUNTER — Telehealth: Payer: Self-pay | Admitting: *Deleted

## 2023-11-14 ENCOUNTER — Encounter: Payer: 59 | Admitting: Student

## 2023-11-14 DIAGNOSIS — Z1231 Encounter for screening mammogram for malignant neoplasm of breast: Secondary | ICD-10-CM

## 2023-11-14 NOTE — Telephone Encounter (Signed)
Spoke with patient regarding her mammogram/ appointment schedule for August 11,2025 @ 10:00 am to arrive 9:40 am. Patient aware of t he 75.00 no show fee.

## 2023-11-30 DIAGNOSIS — J449 Chronic obstructive pulmonary disease, unspecified: Secondary | ICD-10-CM | POA: Diagnosis not present

## 2023-11-30 DIAGNOSIS — I509 Heart failure, unspecified: Secondary | ICD-10-CM | POA: Diagnosis not present

## 2023-12-06 ENCOUNTER — Ambulatory Visit (HOSPITAL_COMMUNITY): Admission: RE | Admit: 2023-12-06 | Payer: 59 | Source: Ambulatory Visit

## 2023-12-11 ENCOUNTER — Other Ambulatory Visit: Payer: Self-pay | Admitting: Student

## 2023-12-11 NOTE — Telephone Encounter (Signed)
 Last Fill: Unknown  Last OV: 10/11/23 Next OV: None Scheduled  Routing to provider for review/authorization.

## 2023-12-11 NOTE — Telephone Encounter (Signed)
 Copied from CRM [id number]. Topic: Clinical - Medication Refill >> Dec 11, 2023  3:02 PM Hamdi H wrote: Most Recent Primary Care Visit:  Provider: Jeral Pinch  Department: [facility]  Visit Type: OPEN ESTABLISHED  Date: 10/11/2023  Medication: Semaglutide (OZEMPIC, 1 MG/DOSE, [place])   Has the patient contacted their pharmacy? Yes (Agent: If no, request that the patient contact the pharmacy for the refill. If patient does not wish to contact the pharmacy document the reason why and proceed with request.) (Agent: If yes, when and what did the pharmacy advise?)  Is this the correct pharmacy for this prescription? Yes If no, delete pharmacy and type the correct one.  This is the patient's preferred pharmacy:  [facility] [address] [place] - [address] AT [facility] [address] [place] [address] Phone: [phone] Fax: [phone]   Has the prescription been filled recently? No  Is the patient out of the medication? Yes  Has the patient been seen for an appointment in the last year OR does the patient have an upcoming appointment? Yes  Can we respond through MyChart? No  Agent: Please be advised that Rx refills may take up to 3 business days. We ask that you follow-up with your pharmacy.

## 2023-12-12 ENCOUNTER — Encounter: Payer: Self-pay | Admitting: Internal Medicine

## 2023-12-13 ENCOUNTER — Ambulatory Visit (HOSPITAL_COMMUNITY)
Admission: RE | Admit: 2023-12-13 | Discharge: 2023-12-13 | Disposition: A | Discharge status: Home / Self Care | Source: Ambulatory Visit | Attending: Internal Medicine | Admitting: Internal Medicine

## 2023-12-13 DIAGNOSIS — I5022 Chronic systolic (congestive) heart failure: Secondary | ICD-10-CM | POA: Insufficient documentation

## 2023-12-13 DIAGNOSIS — I080 Rheumatic disorders of both mitral and aortic valves: Secondary | ICD-10-CM | POA: Insufficient documentation

## 2023-12-13 LAB — ECHOCARDIOGRAM COMPLETE
P 1/2 time: 483 ms
S' Lateral: 4.2 cm
Single Plane A4C EF: 40.6 %

## 2023-12-13 NOTE — Progress Notes (Signed)
  Echocardiogram 2D Echocardiogram has been performed.  Blair Hailey 12/13/2023, 12:27 PM

## 2023-12-13 NOTE — Progress Notes (Signed)
  Echocardiogram 2D Echocardiogram has been performed.  Delcie Roch 12/13/2023, 12:27 PM

## 2023-12-15 NOTE — Telephone Encounter (Signed)
 Copied from CRM [id number]. Topic: Clinical - Medication Refill >> Dec 11, 2023  3:02 PM Hamdi H wrote: Most Recent Primary Care Visit:  Provider: Jeral Pinch  Department: [facility]  Visit Type: OPEN ESTABLISHED  Date: 10/11/2023  Medication: Semaglutide (OZEMPIC, 1 MG/DOSE, [place])   Has the patient contacted their pharmacy? Yes (Agent: If no, request that the patient contact the pharmacy for the refill. If patient does not wish to contact the pharmacy document the reason why and proceed with request.) (Agent: If yes, when and what did the pharmacy advise?)  Is this the correct pharmacy for this prescription? Yes If no, delete pharmacy and type the correct one.  This is the patient's preferred pharmacy:  [facility] [address] [place] - [address] AT [facility] [address] [place] [address] Phone: [phone] Fax: [phone]   Has the prescription been filled recently? No  Is the patient out of the medication? Yes  Has the patient been seen for an appointment in the last year OR does the patient have an upcoming appointment? Yes  Can we respond through MyChart? No  Agent: Please be advised that Rx refills may take up to 3 business days. We ask that you follow-up with your pharmacy.

## 2023-12-20 ENCOUNTER — Telehealth: Payer: Self-pay | Admitting: Student

## 2023-12-20 ENCOUNTER — Other Ambulatory Visit: Payer: Self-pay | Admitting: Student

## 2023-12-20 NOTE — Telephone Encounter (Signed)
 Copied from CRM [id number]. Topic: Clinical - Medication Refill >> Dec 11, 2023  3:02 PM Hamdi H wrote: Most Recent Primary Care Visit:  Provider: Jeral Pinch  Department: [facility]  Visit Type: OPEN ESTABLISHED  Date: 10/11/2023  Medication: Semaglutide (OZEMPIC, 1 MG/DOSE, [place])   Has the patient contacted their pharmacy? Yes (Agent: If no, request that the patient contact the pharmacy for the refill. If patient does not wish to contact the pharmacy document the reason why and proceed with request.) (Agent: If yes, when and what did the pharmacy advise?)  Is this the correct pharmacy for this prescription? Yes If no, delete pharmacy and type the correct one.  This is the patient's preferred pharmacy:  [facility] [address], [place] - [address] AT [facility] [address] [place] [address] Phone: [phone] Fax: [phone]   Has the prescription been filled recently? No  Is the patient out of the medication? Yes  Has the patient been seen for an appointment in the last year OR does the patient have an upcoming appointment? Yes  Can we respond through MyChart? No  Agent: Please be advised that Rx refills may take up to 3 business days. We ask that you follow-up with your pharmacy. >> Dec 20, 2023  4:23 PM Irine Seal wrote: Patient calling in regards to her Semaglutide (OZEMPIC, 1 MG/DOSE, [place]) , she stated the pharmacy has still not received this medication request and she has been waiting since 12/11/23    patient callback number [phone]

## 2023-12-20 NOTE — Telephone Encounter (Unsigned)
 Copied from CRM [id number]. Topic: Clinical - Medication Refill >> Dec 11, 2023  3:02 PM Hamdi H wrote: Most Recent Primary Care Visit:  Provider: Jeral Pinch  Department: [facility]  Visit Type: OPEN ESTABLISHED  Date: 10/11/2023  Medication: Semaglutide (OZEMPIC, 1 MG/DOSE, [place])   Has the patient contacted their pharmacy? Yes (Agent: If no, request that the patient contact the pharmacy for the refill. If patient does not wish to contact the pharmacy document the reason why and proceed with request.) (Agent: If yes, when and what did the pharmacy advise?)  Is this the correct pharmacy for this prescription? Yes If no, delete pharmacy and type the correct one.  This is the patient's preferred pharmacy:  [facility] [address], [place] - [address] AT [facility] [address] [place] [address] Phone: [phone] Fax: [phone]   Has the prescription been filled recently? No  Is the patient out of the medication? Yes  Has the patient been seen for an appointment in the last year OR does the patient have an upcoming appointment? Yes  Can we respond through MyChart? No  Agent: Please be advised that Rx refills may take up to 3 business days. We ask that you follow-up with your pharmacy. >> Dec 20, 2023  4:23 PM Irine Seal wrote: Patient calling in regards to her Semaglutide (OZEMPIC, 1 MG/DOSE, [place]) , she stated the pharmacy has still not received this medication request and she has been waiting since 12/11/23    patient callback number [phone]

## 2023-12-21 ENCOUNTER — Telehealth: Payer: Self-pay | Admitting: Student

## 2023-12-21 DIAGNOSIS — E1143 Type 2 diabetes mellitus with diabetic autonomic (poly)neuropathy: Secondary | ICD-10-CM

## 2023-12-21 MED ORDER — OZEMPIC (1 MG/DOSE) 2 MG/1.5ML ~~LOC~~ SOPN: 1.0000 mg | PEN_INJECTOR | SUBCUTANEOUS | 3 refills | Status: DC

## 2023-12-21 NOTE — Addendum Note (Signed)
 Addended by: Lucille Passy on: 12/21/2023 02:13 PM   Modules accepted: Orders

## 2023-12-21 NOTE — Telephone Encounter (Signed)
 Ozempic refill sent to pharmacy. I would be concerned if this medication could be making her gastroparesis worse.  Would recommend assessing for this at follow-up with Dr. Rosaura Carpenter 12/26/23.

## 2023-12-21 NOTE — Telephone Encounter (Addendum)
 Medication previously refused because patient canceled her last appointment, patient is scheduled to come in on 12/26/23. Patient wants to know if her medication can be refilled.

## 2023-12-21 NOTE — Telephone Encounter (Signed)
 Copied from CRM [id number]. Topic: Clinical - Medication Refill >> Dec 11, 2023  3:02 PM Hamdi H wrote: Most Recent Primary Care Visit:  Provider: Jeral Pinch  Department: [facility]  Visit Type: OPEN ESTABLISHED  Date: 10/11/2023  Medication: Semaglutide (OZEMPIC, 1 MG/DOSE, [place])   Has the patient contacted their pharmacy? Yes (Agent: If no, request that the patient contact the pharmacy for the refill. If patient does not wish to contact the pharmacy document the reason why and proceed with request.) (Agent: If yes, when and what did the pharmacy advise?)  Is this the correct pharmacy for this prescription? Yes If no, delete pharmacy and type the correct one.  This is the patient's preferred pharmacy:  [facility] [address], [place] - [address] AT [facility] [address] [place] [address] Phone: [phone] Fax: [phone]   Has the prescription been filled recently? No  Is the patient out of the medication? Yes  Has the patient been seen for an appointment in the last year OR does the patient have an upcoming appointment? Yes  Can we respond through MyChart? No  Agent: Please be advised that Rx refills may take up to 3 business days. We ask that you follow-up with your pharmacy. >> Dec 20, 2023  4:23 PM Irine Seal wrote: Patient calling in regards to her Semaglutide (OZEMPIC, 1 MG/DOSE, [place]) , she stated the pharmacy has still not received this medication request and she has been waiting since 12/11/23    patient callback number [phone]

## 2023-12-26 ENCOUNTER — Ambulatory Visit: Admitting: Student

## 2023-12-26 VITALS — BP 148/73 | HR 82 | Temp 97.6°F | Ht 62.0 in | Wt 185.7 lb

## 2023-12-26 DIAGNOSIS — E1122 Type 2 diabetes mellitus with diabetic chronic kidney disease: Secondary | ICD-10-CM

## 2023-12-26 DIAGNOSIS — Z7984 Long term (current) use of oral hypoglycemic drugs: Secondary | ICD-10-CM | POA: Diagnosis not present

## 2023-12-26 DIAGNOSIS — N184 Chronic kidney disease, stage 4 (severe): Secondary | ICD-10-CM | POA: Diagnosis not present

## 2023-12-26 DIAGNOSIS — Z794 Long term (current) use of insulin: Secondary | ICD-10-CM

## 2023-12-26 DIAGNOSIS — Z7985 Long-term (current) use of injectable non-insulin antidiabetic drugs: Secondary | ICD-10-CM

## 2023-12-26 DIAGNOSIS — I10 Essential (primary) hypertension: Secondary | ICD-10-CM

## 2023-12-26 DIAGNOSIS — I5022 Chronic systolic (congestive) heart failure: Secondary | ICD-10-CM | POA: Diagnosis not present

## 2023-12-26 DIAGNOSIS — E1165 Type 2 diabetes mellitus with hyperglycemia: Secondary | ICD-10-CM | POA: Diagnosis not present

## 2023-12-26 LAB — POCT GLYCOSYLATED HEMOGLOBIN (HGB A1C): Hemoglobin A1C: 7.8 % — AB (ref 4.0–5.6)

## 2023-12-26 LAB — GLUCOSE, CAPILLARY: Glucose-Capillary: 239 mg/dL — ABNORMAL HIGH (ref 70–99)

## 2023-12-26 MED ORDER — LISINOPRIL 2.5 MG PO TABS: 2.5000 mg | ORAL_TABLET | Freq: Every day | ORAL | 0 refills | Status: DC

## 2023-12-26 MED ORDER — OZEMPIC (1 MG/DOSE) 2 MG/1.5ML ~~LOC~~ SOPN: 1.0000 mg | PEN_INJECTOR | SUBCUTANEOUS | 3 refills | Status: DC

## 2023-12-26 NOTE — Assessment & Plan Note (Addendum)
 Last A1c 9.8 on 10/11/2023.  Current regimen includes Ozempic 1 mg weekly, Farxiga 10 mg daily, and Tresiba 10 units daily.  She had been on metformin in the past, but reports she had kidney complications with this.  She states she has not had any nausea or vomiting with her Ozempic regiment and states she has been on this medication for some months.  She states she goes to the bathroom every 1 to 2 days, denies constipation or belly pain.  No abdominal pain on exam today.  Repeat A1c today 7.8, greatly improved from prior.  Glucometer data reviewed and demonstrated relatively well-controlled fasting glucose.  She does have some glucose spikes around lunch.  Discussed dietary modification with her, she is rarely drinking sodas consuming refined sugars with lunch.  We will see her in 2 weeks for repeat BMP, at that time we may check in again regarding possible Ozempic side effects and consider dose increasing Ozempic at that time.  Plan: Continue with Ozempic 1 mg weekly, can increase to 2 mg weekly next month should patient continue to have no side effects.

## 2023-12-26 NOTE — Progress Notes (Signed)
 Subjective:  CC: Diabetes follow up  HPI:  [name] is a 73 y.o. person with a past medical history stated below and presents today for the stated chief complaint. Please see problem based assessment and plan for additional details.  Past Medical History:  Diagnosis Date   Acute metabolic encephalopathy 07/26/2022   ATN (acute tubular necrosis) (HCC) 07/15/2014   Automatic implantable cardioverter-defibrillator in situ    Automatic implantable cardioverter-defibrillator in situ 05/04/2007   Qualifier: Diagnosis of  By: Levon Hedger     Blood transfusion without reported diagnosis    Cardiac defibrillator in situ    Atlas II VR (SJM) implanted by Dr Ladona Ridgel   CHF (congestive heart failure) (HCC)    Chronic combined systolic and diastolic heart failure (HCC)    a. EF 35-40% in past;  b. Echo 7/13:  EF 45-50%, Gr 2 diast dysfn, mild AI, mild MAC, trivial MR, mild LAE, PASP 47.   Chronic ulcer of leg (HCC)    04-09-15 resolved-not a problem.   Colon polyps 04/12/2013   Rectosigmoid polyp   COPD (chronic obstructive pulmonary disease) (HCC)    O2 at night   Depression    Diabetes mellitus    Diabetes mellitus, type 2 (HCC) 04/03/2012   HIGH RISK FEET.. Please have patient take shoes and socks off every visit for visual foot inspection.     Eczema    Elevated alkaline phosphatase level    GGT and 5'nucleotidase 8/13 normal   Health care maintenance 01/22/2013   Surgically absent cervix- no pap needed (Path report 07/2000: uterine body with attached bilateral  adnexa and separate cervix.)   History of oxygen administration    oxygen @ 2 l/m nasally bedtime 24/7   HTN (hypertension)    Hx of cardiac cath    a. LHC 2003 normal;  b. LHC 6/13:  Mild calcification in the LM, o/w normal coronary arteries, EF 45%.    Hyperkalemia 08/08/2017   Hyperlipidemia    Elevated triglyceride in 2019   Hyperlipidemia associated with type 2 diabetes mellitus (HCC) 05/25/2007    Qualifier: Diagnosis of  By: Daphine Deutscher FNP, [place]     Hyperthyroidism, subclinical    HYPERTHYROIDISM, SUBCLINICAL 05/06/2009   Qualifier: Diagnosis of  By: Flonnie Overman     Implantable cardioverter-defibrillator (ICD) generator end of life    Lipoma    Need for hepatitis C screening test 04/25/2019   NICM (nonischemic cardiomyopathy) (HCC)    Obesity    On home oxygen therapy    "2L; 24/7" (10/11/2016)   Post menopausal syndrome    Shortness of breath 07/14/2017   Skin rash 07/12/2018   Sleep apnea    pt denies 04/12/2013    Current Outpatient Medications on File Prior to Visit  Medication Sig Dispense Refill   Accu-Chek Softclix Lancets lancets Check BG as directed. 100 each 3   albuterol (VENTOLIN HFA) 108 (90 Base) MCG/ACT inhaler Inhale 2 puffs into the lungs every 6 (six) hours as needed for wheezing or shortness of breath. 8 g 2   amiodarone (PACERONE) 200 MG tablet Take 0.5 tablets (100 mg total) by mouth daily. 45 tablet 2   ANORO ELLIPTA 62.5-25 MCG/ACT AEPB INHALE 1 PUFF INTO THE LUNGS DAILY 60 each 2   apixaban (ELIQUIS) 5 MG TABS tablet Take 1 tablet (5 mg total) by mouth 2 (two) times daily. 60 tablet 2   aspirin EC 81 MG tablet Take 81 mg by mouth daily. Swallow  whole.     atorvastatin (LIPITOR) 80 MG tablet Take 80 mg by mouth at bedtime.     bisoprolol (ZEBETA) 5 MG tablet Take 0.5 tablets (2.5 mg total) by mouth daily. 15 tablet 11   calcium-vitamin D (OSCAL WITH D) 500-200 MG-UNIT tablet Take 1 tablet by mouth 2 (two) times daily. 90 tablet 6   Continuous Glucose Receiver (DEXCOM G7 RECEIVER) DEVI Place new sensor every 10 days, use to monitor blood sugar continuously 1 each 0   Continuous Glucose Sensor (DEXCOM G7 SENSOR) MISC Place new sensor every 10 days. Use to monitor blood sugar continuously. 9 each 3   dapagliflozin propanediol (FARXIGA) 10 MG TABS tablet Take 1 tablet (10 mg total) by mouth daily. 90 tablet 1   Insulin Degludec (TRESIBA [place]) Inject 10 Units  into the skin daily.     Insulin Pen Needle (PEN NEEDLES) 32G X 4 MM MISC 1 Needle by Does not apply route daily. 100 each 1   isosorbide-hydrALAZINE (BIDIL) 20-37.5 MG tablet Take 1 tablet by mouth 3 (three) times daily. 90 tablet 3   Lancets Misc. (ACCU-CHEK SOFTCLIX LANCET DEV) KIT Check BG as directed. 1 kit 1   levocetirizine (XYZAL) 5 MG tablet Take 5 mg by mouth at bedtime.     OXYGEN Inhale 2 L into the lungs at bedtime.     potassium chloride SA (KLOR-CON M) 20 MEQ tablet TAKE 1 TABLET(20 MEQ) BY MOUTH DAILY 90 tablet 0   potassium chloride SA (KLOR-CON M) 20 MEQ tablet Take 20 mEq by mouth 2 (two) times daily.     spironolactone (ALDACTONE) 25 MG tablet Take 0.5 tablets (12.5 mg total) by mouth daily. 45 tablet 3   torsemide (DEMADEX) 20 MG tablet Take 80 mg by mouth 2 (two) times daily.     triamcinolone ointment (KENALOG) 0.1 % Apply 1 Application topically 2 (two) times daily. Mix with Eucerin cream     No current facility-administered medications on file prior to visit.    Review of Systems: Please see assessment and plan for pertinent positives and negatives.  Objective:   Vitals:   12/26/23 0900 12/26/23 0907  BP: (!) 160/85 (!) 148/73  Pulse: 90 82  Temp: 97.6 F (36.4 C)   TempSrc: Oral   SpO2: 95%   Weight: 185 lb 11.2 oz (84.2 kg)   Height: 5\' 2"  (1.575 m)     Physical Exam: Constitutional: Well-appearing Cardiovascular: Regular rate and rhythm. 3/6 diastolic murmur upper sternal border Pulmonary/Chest: lungs clear to auscultation bilaterally Abdominal: soft, non-tender, non-distended Extremities: No edema of the lower extremities bilaterally Psych: Very pleasant affect Thought process is linear and is goal-directed.     Assessment & Plan:  Uncontrolled type 2 diabetes mellitus with hyperglycemia (HCC) Last A1c 9.8 on 10/11/2023.  Current regimen includes Ozempic 1 mg weekly, Farxiga 10 mg daily, and Tresiba 10 units daily.  She had been on metformin in  the past, but reports she had kidney complications with this.  She states she has not had any nausea or vomiting with her Ozempic regiment and states she has been on this medication for some months.  She states she goes to the bathroom every 1 to 2 days, denies constipation or belly pain.  No abdominal pain on exam today.  Repeat A1c today 7.8, greatly improved from prior.  Glucometer data reviewed and demonstrated relatively well-controlled fasting glucose.  She does have some glucose spikes around lunch.  Discussed dietary modification with her, she is  rarely drinking sodas consuming refined sugars with lunch.  We will see her in 2 weeks for repeat BMP, at that time we may check in again regarding possible Ozempic side effects and consider dose increasing Ozempic at that time.  Plan: Continue with Ozempic 1 mg weekly, can increase to 2 mg weekly next month should patient continue to have no side effects.   Chronic systolic CHF (congestive heart failure) (HCC) Most recent echo On 12/13/2023 showing improved ejection fraction to 35 - 40%.  Mild aortic regurgitation was noted, she does have a 3 out of 6 diastolic murmur at the upper sternal border present.  She is compliant with her outpatient medication regimen, follows with Dr. Clarise Cruz, and is euvolemic on exam today.  She will follow-up with cardiology in early May. She is not on ACE / ARB due to CKD per chart review. However, her kidney functions seems improved from prior and has been stable. We will initiate very low dose ACE today for GDMT / proteinuria. She will follow with cardiology and nephrology.  Plan: Lisinopril 2.5mg  daily Follow up 2 weeks, for Ozempic titration, BMP   Stage 4 chronic kidney disease (HCC) CKD 4, last GFR 28 and has been stable for some months.  She previously saw Dr. Kathrene Bongo of [facility], 55-month follow-up was recommended at that time but patient did not seem to follow-up with them.  Unclear why.  Last  microalbumin creatinine ratio elevated at 141.  Blood pressure today 148/73.  Patient has not taken any of her medications yet today.  Would recommend she takes her medications prior to office visit so we may get a better gauge of her true blood pressure. Plan: Provided patient with contact information for her physician at [facility], recommended follow-up.    Patient discussed with Dr. Elliot Cousin MD [facility]  PGY-1 Pager: [phone]  Phone: [phone] Date 12/26/2023  Time 10:29 AM

## 2023-12-26 NOTE — Progress Notes (Deleted)
Note created in error.

## 2023-12-26 NOTE — Assessment & Plan Note (Addendum)
 CKD 4, last GFR 28 and has been stable for some months.  She previously saw Dr. Kathrene Bongo of [facility], 76-month follow-up was recommended at that time but patient did not seem to follow-up with them.  Unclear why.  Last microalbumin creatinine ratio elevated at 141.  Blood pressure today 148/73.  Patient has not taken any of her medications yet today.  Would recommend she takes her medications prior to office visit so we may get a better gauge of her true blood pressure. Plan: Provided patient with contact information for her physician at [facility], recommended follow-up.

## 2023-12-26 NOTE — Patient Instructions (Addendum)
 Thank you, [name] for allowing [place] to provide your care today.  I have ordered the following tests for you:  Lab Orders         Glucose, capillary         POC Hbg A1C       I have ordered the following medication/changed the following medications:   Start the following medications: Meds ordered this encounter  Medications   Semaglutide, 1 MG/DOSE, (OZEMPIC, 1 MG/DOSE,) 2 MG/1.5ML SOPN    Sig: Inject 1 mg into the skin once a week.    Dispense:  3 mL    Refill:  3   lisinopril (ZESTRIL) 2.5 MG tablet    Sig: Take 1 tablet (2.5 mg total) by mouth daily.    Dispense:  90 tablet    Refill:  0     Follow up:  1 month  for Kidney check, increasing Ozempic   Please remember: Call your kidney doctor to make an appointment  [name], MD [facility] [address] [place], [place] [address] [phone]     We look forward to seeing you next time. Please call our clinic at [phone] if you have any questions or concerns. The best time to call is Monday-Friday from 9am-4pm, but there is someone available 24/7. If after hours or the weekend, call the main hospital number and ask for the Internal Medicine Resident On-Call. If you need medication refills, please notify your pharmacy one week in advance and they will send [place] a request.   Thank you for trusting me with your care. Wishing you the best!  Lovie Macadamia MD [facility]

## 2023-12-26 NOTE — Assessment & Plan Note (Signed)
>>  ASSESSMENT AND PLAN FOR STAGE 4 CHRONIC KIDNEY DISEASE (HCC) WRITTEN ON 12/26/2023  9:58 AM BY GABINO BOGA, MD  CKD 4, last GFR 28 and has been stable for some months.  She previously saw Dr. Prescilla of [facility], 70-month follow-up was recommended at that time but patient did not seem to follow-up with them.  Unclear why.  Last microalbumin creatinine ratio elevated at 141.  Blood pressure today 148/73.  Patient has not taken any of her medications yet today.  Would recommend she takes her medications prior to office visit so we may get a better gauge of her true blood pressure. Plan: Provided patient with contact information for her physician at [facility], recommended follow-up.

## 2023-12-26 NOTE — Assessment & Plan Note (Addendum)
 Most recent echo On 12/13/2023 showing improved ejection fraction to 35 - 40%.  Mild aortic regurgitation was noted, she does have a 3 out of 6 diastolic murmur at the upper sternal border present.  She is compliant with her outpatient medication regimen, follows with Dr. Clarise Cruz, and is euvolemic on exam today.  She will follow-up with cardiology in early May. She is not on ACE / ARB due to CKD per chart review. However, her kidney functions seems improved from prior and has been stable. We will initiate very low dose ACE today for GDMT / proteinuria. She will follow with cardiology and nephrology.  Plan: Lisinopril 2.5mg  daily Follow up 2 weeks, for Ozempic titration, BMP

## 2023-12-27 ENCOUNTER — Emergency Department (HOSPITAL_COMMUNITY)

## 2023-12-27 ENCOUNTER — Observation Stay (HOSPITAL_COMMUNITY)
Admission: EM | Admit: 2023-12-27 | Discharge: 2023-12-28 | Disposition: A | Discharge status: Home / Self Care | Attending: Internal Medicine | Admitting: Internal Medicine

## 2023-12-27 ENCOUNTER — Encounter (HOSPITAL_COMMUNITY): Payer: Self-pay

## 2023-12-27 ENCOUNTER — Other Ambulatory Visit: Payer: Self-pay

## 2023-12-27 DIAGNOSIS — I679 Cerebrovascular disease, unspecified: Secondary | ICD-10-CM | POA: Diagnosis not present

## 2023-12-27 DIAGNOSIS — I5020 Unspecified systolic (congestive) heart failure: Secondary | ICD-10-CM | POA: Diagnosis present

## 2023-12-27 DIAGNOSIS — I959 Hypotension, unspecified: Secondary | ICD-10-CM

## 2023-12-27 DIAGNOSIS — I6782 Cerebral ischemia: Secondary | ICD-10-CM | POA: Diagnosis not present

## 2023-12-27 DIAGNOSIS — I5042 Chronic combined systolic (congestive) and diastolic (congestive) heart failure: Secondary | ICD-10-CM | POA: Diagnosis not present

## 2023-12-27 DIAGNOSIS — R29810 Facial weakness: Secondary | ICD-10-CM | POA: Diagnosis not present

## 2023-12-27 DIAGNOSIS — N184 Chronic kidney disease, stage 4 (severe): Secondary | ICD-10-CM | POA: Diagnosis not present

## 2023-12-27 DIAGNOSIS — G4733 Obstructive sleep apnea (adult) (pediatric): Secondary | ICD-10-CM | POA: Diagnosis not present

## 2023-12-27 DIAGNOSIS — R0902 Hypoxemia: Secondary | ICD-10-CM | POA: Diagnosis not present

## 2023-12-27 DIAGNOSIS — Z7982 Long term (current) use of aspirin: Secondary | ICD-10-CM | POA: Diagnosis not present

## 2023-12-27 DIAGNOSIS — J449 Chronic obstructive pulmonary disease, unspecified: Secondary | ICD-10-CM | POA: Diagnosis not present

## 2023-12-27 DIAGNOSIS — R531 Weakness: Secondary | ICD-10-CM | POA: Insufficient documentation

## 2023-12-27 DIAGNOSIS — R2990 Unspecified symptoms and signs involving the nervous system: Secondary | ICD-10-CM | POA: Diagnosis not present

## 2023-12-27 DIAGNOSIS — Z79899 Other long term (current) drug therapy: Secondary | ICD-10-CM | POA: Diagnosis not present

## 2023-12-27 DIAGNOSIS — R29818 Other symptoms and signs involving the nervous system: Secondary | ICD-10-CM | POA: Diagnosis not present

## 2023-12-27 DIAGNOSIS — I952 Hypotension due to drugs: Principal | ICD-10-CM

## 2023-12-27 DIAGNOSIS — I4891 Unspecified atrial fibrillation: Secondary | ICD-10-CM | POA: Insufficient documentation

## 2023-12-27 DIAGNOSIS — R55 Syncope and collapse: Principal | ICD-10-CM | POA: Diagnosis present

## 2023-12-27 DIAGNOSIS — R918 Other nonspecific abnormal finding of lung field: Secondary | ICD-10-CM | POA: Diagnosis not present

## 2023-12-27 DIAGNOSIS — N898 Other specified noninflammatory disorders of vagina: Secondary | ICD-10-CM | POA: Diagnosis not present

## 2023-12-27 DIAGNOSIS — Z7901 Long term (current) use of anticoagulants: Secondary | ICD-10-CM | POA: Insufficient documentation

## 2023-12-27 DIAGNOSIS — R6889 Other general symptoms and signs: Secondary | ICD-10-CM | POA: Diagnosis not present

## 2023-12-27 DIAGNOSIS — Z95810 Presence of automatic (implantable) cardiac defibrillator: Secondary | ICD-10-CM | POA: Diagnosis present

## 2023-12-27 DIAGNOSIS — I130 Hypertensive heart and chronic kidney disease with heart failure and stage 1 through stage 4 chronic kidney disease, or unspecified chronic kidney disease: Secondary | ICD-10-CM | POA: Insufficient documentation

## 2023-12-27 DIAGNOSIS — F17210 Nicotine dependence, cigarettes, uncomplicated: Secondary | ICD-10-CM | POA: Diagnosis not present

## 2023-12-27 DIAGNOSIS — A599 Trichomoniasis, unspecified: Secondary | ICD-10-CM | POA: Diagnosis not present

## 2023-12-27 DIAGNOSIS — N179 Acute kidney failure, unspecified: Secondary | ICD-10-CM | POA: Diagnosis not present

## 2023-12-27 DIAGNOSIS — I4892 Unspecified atrial flutter: Secondary | ICD-10-CM | POA: Diagnosis present

## 2023-12-27 DIAGNOSIS — I5032 Chronic diastolic (congestive) heart failure: Secondary | ICD-10-CM | POA: Diagnosis present

## 2023-12-27 DIAGNOSIS — M129 Arthropathy, unspecified: Secondary | ICD-10-CM | POA: Diagnosis not present

## 2023-12-27 DIAGNOSIS — E1122 Type 2 diabetes mellitus with diabetic chronic kidney disease: Secondary | ICD-10-CM | POA: Diagnosis not present

## 2023-12-27 DIAGNOSIS — R404 Transient alteration of awareness: Secondary | ICD-10-CM | POA: Diagnosis not present

## 2023-12-27 DIAGNOSIS — R200 Anesthesia of skin: Secondary | ICD-10-CM | POA: Diagnosis not present

## 2023-12-27 DIAGNOSIS — I5023 Acute on chronic systolic (congestive) heart failure: Secondary | ICD-10-CM | POA: Diagnosis present

## 2023-12-27 DIAGNOSIS — I5022 Chronic systolic (congestive) heart failure: Secondary | ICD-10-CM | POA: Diagnosis present

## 2023-12-27 LAB — DIFFERENTIAL
Abs Immature Granulocytes: 0.04 10*3/uL (ref 0.00–0.07)
Basophils Absolute: 0.1 10*3/uL (ref 0.0–0.1)
Basophils Relative: 1 %
Eosinophils Absolute: 0.6 10*3/uL — ABNORMAL HIGH (ref 0.0–0.5)
Eosinophils Relative: 7 %
Immature Granulocytes: 1 %
Lymphocytes Relative: 24 %
Lymphs Abs: 2 10*3/uL (ref 0.7–4.0)
Monocytes Absolute: 0.7 10*3/uL (ref 0.1–1.0)
Monocytes Relative: 9 %
Neutro Abs: 4.8 10*3/uL (ref 1.7–7.7)
Neutrophils Relative %: 58 %

## 2023-12-27 LAB — COMPREHENSIVE METABOLIC PANEL
ALT: 12 U/L (ref 0–44)
AST: 15 U/L (ref 15–41)
Albumin: 3.4 g/dL — ABNORMAL LOW (ref 3.5–5.0)
Alkaline Phosphatase: 105 U/L (ref 38–126)
Anion gap: 6 (ref 5–15)
BUN: 48 mg/dL — ABNORMAL HIGH (ref 8–23)
CO2: 29 mmol/L (ref 22–32)
Calcium: 8.7 mg/dL — ABNORMAL LOW (ref 8.9–10.3)
Chloride: 103 mmol/L (ref 98–111)
Creatinine, Ser: 2.37 mg/dL — ABNORMAL HIGH (ref 0.44–1.00)
GFR, Estimated: 22 mL/min — ABNORMAL LOW (ref >60)
Glucose, Bld: 215 mg/dL — ABNORMAL HIGH (ref 70–99)
Potassium: 4.2 mmol/L (ref 3.5–5.1)
Sodium: 138 mmol/L (ref 135–145)
Total Bilirubin: 0.5 mg/dL (ref 0.0–1.2)
Total Protein: 6.3 g/dL — ABNORMAL LOW (ref 6.5–8.1)

## 2023-12-27 LAB — I-STAT CHEM 8, ED
BUN: 49 mg/dL — ABNORMAL HIGH (ref 8–23)
Calcium, Ion: 1.05 mmol/L — ABNORMAL LOW (ref 1.15–1.40)
Chloride: 102 mmol/L (ref 98–111)
Creatinine, Ser: 2.4 mg/dL — ABNORMAL HIGH (ref 0.44–1.00)
Glucose, Bld: 205 mg/dL — ABNORMAL HIGH (ref 70–99)
HCT: 38 % (ref 36.0–46.0)
Hemoglobin: 12.9 g/dL (ref 12.0–15.0)
Potassium: 4.2 mmol/L (ref 3.5–5.1)
Sodium: 139 mmol/L (ref 135–145)
TCO2: 27 mmol/L (ref 22–32)

## 2023-12-27 LAB — TSH: TSH: 2.087 u[IU]/mL (ref 0.350–4.500)

## 2023-12-27 LAB — TROPONIN I (HIGH SENSITIVITY)
Troponin I (High Sensitivity): 14 ng/L (ref <18)
Troponin I (High Sensitivity): 14 ng/L (ref <18)

## 2023-12-27 LAB — PROTIME-INR
INR: 1.1 (ref 0.8–1.2)
Prothrombin Time: 14.1 s (ref 11.4–15.2)

## 2023-12-27 LAB — MAGNESIUM: Magnesium: 2 mg/dL (ref 1.7–2.4)

## 2023-12-27 LAB — CBC
HCT: 38.5 % (ref 36.0–46.0)
Hemoglobin: 11.8 g/dL — ABNORMAL LOW (ref 12.0–15.0)
MCH: 26.3 pg (ref 26.0–34.0)
MCHC: 30.6 g/dL (ref 30.0–36.0)
MCV: 85.7 fL (ref 80.0–100.0)
Platelets: 231 10*3/uL (ref 150–400)
RBC: 4.49 MIL/uL (ref 3.87–5.11)
RDW: 15.2 % (ref 11.5–15.5)
WBC: 8.1 10*3/uL (ref 4.0–10.5)
nRBC: 0 % (ref 0.0–0.2)

## 2023-12-27 LAB — APTT: aPTT: 39 s — ABNORMAL HIGH (ref 24–36)

## 2023-12-27 LAB — ETHANOL: Alcohol, Ethyl (B): <10 mg/dL (ref <10)

## 2023-12-27 LAB — CBG MONITORING, ED: Glucose-Capillary: 197 mg/dL — ABNORMAL HIGH (ref 70–99)

## 2023-12-27 MED ORDER — ALBUTEROL SULFATE (2.5 MG/3ML) 0.083% IN NEBU: 3.0000 mL | INHALATION_SOLUTION | Freq: Four times a day (QID) | RESPIRATORY_TRACT | Status: DC | PRN

## 2023-12-27 MED ORDER — OYSTER SHELL CALCIUM/D3 500-5 MG-MCG PO TABS
1.0000 | ORAL_TABLET | Freq: Two times a day (BID) | ORAL | Status: DC
Start: 2023-12-27 — End: 2023-12-28
  Administered 2023-12-28 (×2): 1 via ORAL
  Filled 2023-12-27 (×3): qty 1

## 2023-12-27 MED ORDER — CALCIUM CARBONATE-VITAMIN D 500-200 MG-UNIT PO TABS: 1.0000 | ORAL_TABLET | Freq: Two times a day (BID) | ORAL | Status: DC

## 2023-12-27 MED ORDER — ATORVASTATIN CALCIUM 40 MG PO TABS
80.0000 mg | ORAL_TABLET | Freq: Every day | ORAL | Status: DC
  Administered 2023-12-27: 80 mg via ORAL
  Filled 2023-12-27: qty 2

## 2023-12-27 MED ORDER — SODIUM CHLORIDE 0.9 % IV BOLUS
500.0000 mL | Freq: Once | INTRAVENOUS | Status: AC
  Administered 2023-12-27: 500 mL via INTRAVENOUS

## 2023-12-27 MED ORDER — POTASSIUM CHLORIDE CRYS ER 20 MEQ PO TBCR
20.0000 meq | EXTENDED_RELEASE_TABLET | Freq: Two times a day (BID) | ORAL | Status: DC
  Administered 2023-12-27 – 2023-12-28 (×2): 20 meq via ORAL
  Filled 2023-12-27 (×2): qty 1

## 2023-12-27 MED ORDER — AMIODARONE HCL 200 MG PO TABS
100.0000 mg | ORAL_TABLET | Freq: Every day | ORAL | Status: DC
  Administered 2023-12-28: 100 mg via ORAL
  Filled 2023-12-27: qty 1

## 2023-12-27 MED ORDER — INSULIN GLARGINE-YFGN 100 UNIT/ML ~~LOC~~ SOLN
5.0000 [IU] | Freq: Every day | SUBCUTANEOUS | Status: DC
  Administered 2023-12-28: 5 [IU] via SUBCUTANEOUS
  Filled 2023-12-27 (×2): qty 0.05

## 2023-12-27 MED ORDER — DAPAGLIFLOZIN PROPANEDIOL 10 MG PO TABS
10.0000 mg | ORAL_TABLET | Freq: Every day | ORAL | Status: DC
  Administered 2023-12-28: 10 mg via ORAL
  Filled 2023-12-27: qty 1

## 2023-12-27 MED ORDER — UMECLIDINIUM-VILANTEROL 62.5-25 MCG/ACT IN AEPB
1.0000 | INHALATION_SPRAY | Freq: Every day | RESPIRATORY_TRACT | Status: DC
  Administered 2023-12-28: 1 via RESPIRATORY_TRACT
  Filled 2023-12-27: qty 14

## 2023-12-27 MED ORDER — SENNOSIDES-DOCUSATE SODIUM 8.6-50 MG PO TABS: 1.0000 | ORAL_TABLET | Freq: Every evening | ORAL | Status: DC | PRN

## 2023-12-27 MED ORDER — SODIUM CHLORIDE 0.9 % IV BOLUS: 1000.0000 mL | Freq: Once | INTRAVENOUS | Status: DC

## 2023-12-27 MED ORDER — INSULIN ASPART 100 UNIT/ML IJ SOLN
0.0000 [IU] | Freq: Three times a day (TID) | INTRAMUSCULAR | Status: DC
  Administered 2023-12-28: 1 [IU] via SUBCUTANEOUS

## 2023-12-27 NOTE — ED Notes (Signed)
 Ccmd contacted to place the pt on watch

## 2023-12-27 NOTE — Code Documentation (Signed)
 Stroke Response Nurse Documentation Code Documentation  [name] is a 73 y.o. female arriving to [facility]  via [facility] on 12/27/2023 with past medical hx of CHF, defibrillator, CKD, COPD, DM, HLD, AF on eliquis and sleep apnea. On Eliquis (apixaban) daily. Code stroke was activated by ED.   Patient from home where she was LKW at "around 1500" when she was talking to neighbors,  and now complaining of rt sided weakness and numbness.  Stroke team met pt in CT scanner once activated. Patient cleared for CT by Dr. Particia Nearing. NIHSS 7, see documentation for details and code stroke times. The following imaging was completed:  CT Head. Patient is not a candidate for IV Thrombolytic due to Eliquis. Patient is not a candidate for IR due to VAN negative..   Care Plan: NIHSS and VS q 2 x 12                   Then q 4.                    NPO until stroke swallow screen    Bedside handoff with ED RN Thayer Ohm.    Argentina Donovan  Stroke Response RN

## 2023-12-27 NOTE — Hospital Course (Addendum)
 Syncope c/f TIA Symptomatic hypotension Presented to the hospital after an episode of syncope while standing outside talking with her neighbors.  Recently started on lisinopril in addition to her other blood pressure lowering medications.  Blood pressure was in the 70s systolic on EMS arrival and improved with a bolus of IV fluids.  Stroke workup was initiated and neuro was consulted.  CT head, MRI brain, MRA head were all negative.  Carotid ultrasound showed mild stenosis bilaterally in the carotid arteries.  Clinically, she reported that she was at her baseline and did not have any residual focal deficits.  She worked with the physical therapist and occupational therapist, who agreed that she did not have any new functional deficits.  I suspect her syncopal event was in the setting of hypotension given that she recently started lisinopril yesterday.  Per chart review, her blood pressure has been running low consistently for the past few months at baseline.  We will hold her lisinopril and isosorbide-hydralazine for now.  We have restarted her other antihypertensive medications, which can be adjusted at follow-up. With a negative stroke workup and clinical return to baseline, she is medically stable for discharge with close outpatient follow up.   Trichomoniasis Vaginal discharge Patient has been having dark, malodorous vaginal discharge recently. Last sexual encounter was 2 months ago. Urine positive for bacteriuria, pyuria, and trichomonas.  Started on Flagyl twice daily for 7 days.  Recommend further STI screening outpatient.   Atrial fibrillation on eliquis Regular rate and rhythm during admission. On Eliquis.   CKD stage 4 Baseline Cr at 2-3. Currently stable.   HFrEF (LV EF 35-40%) s/p ICD placement Follows with Dr. Gala Romney. Recent echo on 3/12. On torsemide 80 mg BID, bisoprolol 2.5 mg daily, BiDil 1 tab 3 times daily, Farxiga 10 mg daily, spironolactone 12.5 mg daily.  COPD Continued  home anoro ellipta 1 puff daily, albuterol 2 puffs q6h PRN. Outpatient follow up.   Type 2 diabetes mellitus A1c 7.8%. On tresiba 10u daily outpatient.   OSA 2L [place] at bedtime. Stable.

## 2023-12-27 NOTE — ED Provider Notes (Signed)
 [place] EMERGENCY DEPARTMENT AT [facility] Provider Note   CSN: [id number] Arrival date & time: 12/27/23  1027  An emergency department physician performed an initial assessment on this suspected stroke patient at 1720.  History  Chief Complaint  Patient presents with   Code Stroke    [name] is a 73 y.o. female.  Pt is a 73 yo female with pmhx significant for CHF (EF 20-30%), hld, nonischemic cm, s/p pacemaker placement, dm, htn, sleep apnea, copd, aflutter (on Eliquis) and hyperthyroidism.  Pt saw her pcp ([facility]) yesterday and was started on low dose lisinopril (2.5 mg).  She took it for the first time today.  Pt had a syncopal event today and EMS was called.  When EMS arrived, she was unresponsive and BP was 70/30.  She was given 500 cc NS and she is awake and alert.  She does complain of right sided weakness and numbness.  Code stroke called upon arrival due to these sx.         Home Medications Prior to Admission medications   Medication Sig Start Date End Date Taking? Authorizing Provider  albuterol (VENTOLIN HFA) 108 (90 Base) MCG/ACT inhaler Inhale 2 puffs into the lungs every 6 (six) hours as needed for wheezing or shortness of breath. 07/20/23  Yes Kathleen Lime, MD  amiodarone (PACERONE) 200 MG tablet Take 0.5 tablets (100 mg total) by mouth daily. 03/03/23 12/27/23 Yes Bensimhon, Bevelyn Buckles, MD  Ailene Ards ELLIPTA 62.5-25 MCG/ACT AEPB INHALE 1 PUFF INTO THE LUNGS DAILY 10/10/23  Yes Kathleen Lime, MD  apixaban (ELIQUIS) 5 MG TABS tablet Take 1 tablet (5 mg total) by mouth 2 (two) times daily. 11/10/23 02/08/24 Yes Bensimhon, Bevelyn Buckles, MD  aspirin EC 81 MG tablet Take 81 mg by mouth daily. Swallow whole.   Yes [provider]  atorvastatin (LIPITOR) 80 MG tablet Take 80 mg by mouth at bedtime.   Yes [provider]  bisoprolol (ZEBETA) 5 MG tablet Take 0.5 tablets (2.5 mg total) by mouth daily. 08/30/23  Yes Alen Bleacher, NP  calcium-vitamin D (OSCAL  WITH D) 500-200 MG-UNIT tablet Take 1 tablet by mouth 2 (two) times daily. 02/12/20  Yes Lanelle Bal, MD  Continuous Glucose Receiver (DEXCOM G7 RECEIVER) DEVI Place new sensor every 10 days, use to monitor blood sugar continuously 06/26/23  Yes Tawkaliyar, Roya, DO  Continuous Glucose Sensor (DEXCOM G7 SENSOR) MISC Place new sensor every 10 days. Use to monitor blood sugar continuously. 06/26/23  Yes Tawkaliyar, Roya, DO  dapagliflozin propanediol (FARXIGA) 10 MG TABS tablet Take 1 tablet (10 mg total) by mouth daily. 08/29/23  Yes Kathleen Lime, MD  Insulin Degludec (TRESIBA [place]) Inject 10 Units into the skin daily.   Yes [provider]  isosorbide-hydrALAZINE (BIDIL) 20-37.5 MG tablet Take 1 tablet by mouth 3 (three) times daily. 03/16/23  Yes Mapp, Tavien, MD  ketoconazole (NIZORAL) 2 % shampoo Apply 1 Application topically once a week. 10/16/23  Yes [provider]  levocetirizine (XYZAL) 5 MG tablet Take 5 mg by mouth at bedtime. 02/21/23  Yes [provider]  lisinopril (ZESTRIL) 2.5 MG tablet Take 1 tablet (2.5 mg total) by mouth daily. 12/26/23  Yes Lovie Macadamia, MD  OZEMPIC, 1 MG/DOSE, 4 MG/3ML SOPN Inject 1 mg into the skin once a week. 12/22/23  Yes [provider]  potassium chloride SA (KLOR-CON M) 20 MEQ tablet Take 20 mEq by mouth 2 (two) times daily.   Yes [provider]  torsemide (DEMADEX) 20 MG tablet Take 80 mg by mouth 2 (two) times daily. 03/06/23  Yes [provider]  triamcinolone ointment (KENALOG) 0.1 % Apply 1 Application topically 2 (two) times daily. Mix with Eucerin cream 02/20/23  Yes [provider]  Accu-Chek Softclix Lancets lancets Check BG as directed. 11/03/22   Mapp, Gaylyn Cheers, MD  Insulin Pen Needle (PEN NEEDLES) 32G X 4 MM MISC 1 Needle by Does not apply route daily. 12/09/22   Doran Stabler, DO  Lancets Misc. (ACCU-CHEK SOFTCLIX LANCET DEV) KIT Check BG as directed. 11/03/22   Mapp, Gaylyn Cheers, MD  OXYGEN  Inhale 2 L into the lungs at bedtime.    [provider]  potassium chloride SA (KLOR-CON M) 20 MEQ tablet TAKE 1 TABLET(20 MEQ) BY MOUTH DAILY 10/09/23   Bensimhon, Bevelyn Buckles, MD  spironolactone (ALDACTONE) 25 MG tablet Take 0.5 tablets (12.5 mg total) by mouth daily. Patient not taking: Reported on 12/27/2023 06/14/23   Bensimhon, Bevelyn Buckles, MD      Allergies    Actos [pioglitazone], Naproxen, Rosiglitazone, Metformin and related, Tomato, Hydrocodone-acetaminophen, and Hydrocortisone    Review of Systems   Review of Systems  Neurological:  Positive for syncope and weakness.  All other systems reviewed and are negative.   Physical Exam Updated Vital Signs BP (!) 123/58   Pulse 73   Temp 97.9 F (36.6 C)   Resp 20   Ht 5\' 2"  (1.575 m)   Wt 84.2 kg   SpO2 95%   BMI 33.96 kg/m  Physical Exam Vitals and nursing note reviewed.  Constitutional:      Appearance: Normal appearance. She is obese.  HENT:     Head: Normocephalic and atraumatic.     Right Ear: External ear normal.     Left Ear: External ear normal.     Nose: Nose normal.     Mouth/Throat:     Mouth: Mucous membranes are moist.     Pharynx: Oropharynx is clear.  Eyes:     Extraocular Movements: Extraocular movements intact.     Conjunctiva/sclera: Conjunctivae normal.     Pupils: Pupils are equal, round, and reactive to light.  Cardiovascular:     Rate and Rhythm: Normal rate and regular rhythm.     Pulses: Normal pulses.     Heart sounds: Normal heart sounds.  Pulmonary:     Effort: Pulmonary effort is normal.     Breath sounds: Normal breath sounds.  Abdominal:     General: Abdomen is flat. Bowel sounds are normal.     Palpations: Abdomen is soft.  Musculoskeletal:        General: Normal range of motion.     Cervical back: Normal range of motion and neck supple.  Skin:    General: Skin is warm.     Capillary Refill: Capillary refill takes less than 2 seconds.  Neurological:     Mental Status: She  is alert and oriented to person, place, and time.     Comments: Numbness right face.  Right arm/leg feels "heavy."  Pt has very little effort with moving either arm or leg.  Psychiatric:        Mood and Affect: Mood normal.     ED Results / Procedures / Treatments   Labs (all labs ordered are listed, but only abnormal results are displayed) Labs Reviewed  APTT - Abnormal; Notable for the following components:      Result Value   aPTT 39 (*)  All other components within normal limits  CBC - Abnormal; Notable for the following components:   Hemoglobin 11.8 (*)    All other components within normal limits  DIFFERENTIAL - Abnormal; Notable for the following components:   Eosinophils Absolute 0.6 (*)    All other components within normal limits  COMPREHENSIVE METABOLIC PANEL - Abnormal; Notable for the following components:   Glucose, Bld 215 (*)    BUN 48 (*)    Creatinine, Ser 2.37 (*)    Calcium 8.7 (*)    Total Protein 6.3 (*)    Albumin 3.4 (*)    GFR, Estimated 22 (*)    All other components within normal limits  I-STAT CHEM 8, ED - Abnormal; Notable for the following components:   BUN 49 (*)    Creatinine, Ser 2.40 (*)    Glucose, Bld 205 (*)    Calcium, Ion 1.05 (*)    All other components within normal limits  CBG MONITORING, ED - Abnormal; Notable for the following components:   Glucose-Capillary 197 (*)    All other components within normal limits  ETHANOL  PROTIME-INR  TSH  RAPID URINE DRUG SCREEN, HOSP PERFORMED  URINALYSIS, ROUTINE W REFLEX MICROSCOPIC  LIPID PANEL  HIV ANTIBODY (ROUTINE TESTING W REFLEX)  BASIC METABOLIC PANEL  CBC  MAGNESIUM  TROPONIN I (HIGH SENSITIVITY)  TROPONIN I (HIGH SENSITIVITY)    EKG None  Radiology DG Chest Portable 1 View Result Date: 12/27/2023 CLINICAL DATA:  Syncope, unresponsive, hypotension. EXAM: PORTABLE CHEST 1 VIEW COMPARISON:  08/14/2023 FINDINGS: AICD remains in position. Borderline enlargement of the  cardiopericardial silhouette. Scarring or subsegmental atelectasis in the left mid to lower lung, probably in the lingula. The lungs appear otherwise clear. No blunting of the costophrenic angles. Degenerative glenohumeral arthropathy bilaterally. Atherosclerotic calcification of the aortic arch. IMPRESSION: 1. Borderline enlargement of the cardiopericardial silhouette. 2. Scarring or subsegmental atelectasis in the left mid to lower lung, probably in the lingula. 3. Degenerative glenohumeral arthropathy bilaterally. Electronically Signed   By: Gaylyn Rong M.D.   On: 12/27/2023 19:29   CT HEAD CODE STROKE WO CONTRAST Result Date: 12/27/2023 CLINICAL DATA:  Code stroke. Neuro deficit, acute, stroke suspected. Right-sided weakness and numbness. EXAM: CT HEAD WITHOUT CONTRAST TECHNIQUE: Contiguous axial images were obtained from the base of the skull through the vertex without intravenous contrast. RADIATION DOSE REDUCTION: This exam was performed according to the departmental dose-optimization program which includes automated exposure control, adjustment of the mA and/or kV according to patient size and/or use of iterative reconstruction technique. COMPARISON:  Head CT 06/26/2022 FINDINGS: Brain: There is no evidence of an acute infarct, intracranial hemorrhage, mass, midline shift, or extra-axial fluid collection. Cerebral volume is normal. The ventricles are normal in size. Patchy cerebral white matter hypodensities have mildly progressed from the prior CT and are nonspecific but compatible with mild-to-moderate chronic small vessel ischemic disease. Vascular: Calcified atherosclerosis at the skull base. No hyperdense vessel. Skull: No acute fracture or suspicious lesion. Sinuses/Orbits: Paranasal sinuses and mastoid air cells are clear. Right cataract extraction. Other: None. ASPECTS ([place] Stroke Program Early CT Score) - Ganglionic level infarction (caudate, lentiform nuclei, internal capsule, insula,  M1-M3 cortex): 7 - Supraganglionic infarction (M4-M6 cortex): 3 Total score (0-10 with 10 being normal): 10 These results were communicated to Dr. Iver Nestle at 5:52 pm on 12/27/2023 by text page via the [facility] messaging system. IMPRESSION: 1. No evidence of acute intracranial abnormality. ASPECTS of 10. 2. Mild-to-moderate chronic small vessel ischemic  disease. Electronically Signed   By: Sebastian Ache M.D.   On: 12/27/2023 17:52    Procedures Procedures    Medications Ordered in ED Medications  senna-docusate (Senokot-S) tablet 1 tablet (has no administration in time range)  albuterol (VENTOLIN HFA) 108 (90 Base) MCG/ACT inhaler 2 puff (has no administration in time range)  amiodarone (PACERONE) tablet 100 mg (has no administration in time range)  umeclidinium-vilanterol (ANORO ELLIPTA) 62.5-25 MCG/ACT 1 puff (has no administration in time range)  atorvastatin (LIPITOR) tablet 80 mg (has no administration in time range)  calcium-vitamin D (OSCAL WITH D) 500-200 MG-UNIT per tablet 1 tablet (has no administration in time range)  potassium chloride SA (KLOR-CON M) CR tablet 20 mEq (has no administration in time range)  dapagliflozin propanediol (FARXIGA) tablet 10 mg (has no administration in time range)  sodium chloride 0.9 % bolus 500 mL (0 mLs Intravenous Stopped 12/27/23 2147)    ED Course/ Medical Decision Making/ A&P                                 Medical Decision Making Amount and/or Complexity of Data Reviewed Labs: ordered. Radiology: ordered.  Risk Decision regarding hospitalization.   This patient presents to the ED for concern of syncope/weakness, this involves an extensive number of treatment options, and is a complaint that carries with it a high risk of complications and morbidity.  The differential diagnosis includes medication, arrhythmia, cva, electrolyte abn, infection   Co morbidities that complicate the patient evaluation  CHF (EF 20-30%), hld, nonischemic cm, s/p  pacemaker placement, dm, htn, sleep apnea, copd, aflutter (on Eliquis) and hyperthyroidism   Additional history obtained:  Additional history obtained from epic chart review External records from outside source obtained and reviewed including EMS report   Lab Tests:  I Ordered, and personally interpreted labs.  The pertinent results include:  cbc nl other than hgb sl low at 11.8 (13 in feb), cmp with bun 48 and cr 2.37 (33 and 1.92 in feb), etoh neg, tsh 2.087   Imaging Studies ordered:  I ordered imaging studies including ct head, cxr I independently visualized and interpreted imaging which showed  CT: No evidence of acute intracranial abnormality. ASPECTS of 10.  2. Mild-to-moderate chronic small vessel ischemic disease.  CXR:  Borderline enlargement of the cardiopericardial silhouette.  2. Scarring or subsegmental atelectasis in the left mid to lower  lung, probably in the lingula.  3. Degenerative glenohumeral arthropathy bilaterally.   I agree with the radiologist interpretation   Cardiac Monitoring:  The patient was maintained on a cardiac monitor.  I personally viewed and interpreted the cardiac monitored which showed an underlying rhythm of: nsr   Medicines ordered and prescription drug management:  I ordered medication including ivfs  for sx  Reevaluation of the patient after these medicines showed that the patient improved I have reviewed the patients home medicines and have made adjustments as needed   Test Considered:  Ct/mri   Critical Interventions:  Code stroke   Consultations Obtained:  I requested consultation with the neurologist (Dr. Iver Nestle),  and discussed lab and imaging findings as well as pertinent plan - she recommends MRI.  Pt has a pacemaker and MRI can't be done until tomorrow.  She also requested US carotid.   Pt d/w IMTS for admission   Problem List / ED Course:  Syncope:  likely due to hypotension in the face of taking  her bp  meds + new lisinopril.  Fluids given gently due to hx EF 20-30%.  [employer] pacemaker eval pending. ? CVA:  likely hypotension related, but MRI pending.     Reevaluation:  After the interventions noted above, I reevaluated the patient and found that they have :improved   Social Determinants of Health:  Lives at home   Dispostion:  After consideration of the diagnostic results and the patients response to treatment, I feel that the patent would benefit from admission.    CRITICAL CARE Performed by: Jacalyn Lefevre   Total critical care time: 30 minutes  Critical care time was exclusive of separately billable procedures and treating other patients.  Critical care was necessary to treat or prevent imminent or life-threatening deterioration.  Critical care was time spent personally by me on the following activities: development of treatment plan with patient and/or surrogate as well as nursing, discussions with consultants, evaluation of patient's response to treatment, examination of patient, obtaining history from patient or surrogate, ordering and performing treatments and interventions, ordering and review of laboratory studies, ordering and review of radiographic studies, pulse oximetry and re-evaluation of patient's condition.         Final Clinical Impression(s) / ED Diagnoses Final diagnoses:  Syncope, unspecified syncope type  Hypotension, unspecified hypotension type  Weakness    Rx / DC Orders ED Discharge Orders     None         Jacalyn Lefevre, MD 12/27/23 2203

## 2023-12-27 NOTE — Consult Note (Addendum)
 NEUROLOGY CONSULT NOTE   Date of service: December 27, 2023 Patient Name: [name] MRN:  [id number] DOB:  1950/11/16 Chief Complaint: "Code Stroke" Requesting Provider: Jacalyn Lefevre, MD  History of Present Illness  [name] is a 73 y.o. female with hx of CHF s/p defibrillator placement, CKD, COPD, depression, DM, HLD, Obesity, afib on eliquis and sleep apnea. She was initially found by her family unresponsive with BP 70/30 and HR 80. While in triage she awoke enough to report right sided weakness, left facial droop, and diminished sensation on the right. Repeat BP in CT 103/61 after 500cc of fluid. On exam facial droop is resolved, she has bilateral lower extremity weakness, right sensory deficit, and mild dysarthria and mild aphasia. She took her lisinopril that was prescribed by her PCP for the first time yesterday.  At PCP office yesterday BP elevated at 160-148/73 (with notes that she had not taken any of her blood pressure medications that morning). Echo done 12/13/2023 showed improving EF now at 35-40% and lisinopril 2.5mg  was added. She is additionally on ozempic 1mg  weekly, Farxiga 10mg  daily and tresiba 10u daily.    LKW: 1720 Modified rankin score: 0-Completely asymptomatic and back to baseline post- stroke IV Thrombolysis: no, on eliquis EVT: No, exam not consistent with LVO  NIHSS components Score: Comment  1a Level of Conscious 0[x]  1[]  2[]  3[]      1b LOC Questions 0[x]  1[]  2[]       1c LOC Commands 0[x]  1[]  2[]       2 Best Gaze 0[x]  1[]  2[]       3 Visual 0[x]  1[]  2[]  3[]      4 Facial Palsy 0[x]  1[]  2[]  3[]      5a Motor Arm - left 0[x]  1[]  2[]  3[]  4[]  UN[]    5b Motor Arm - Right 0[x]  1[]  2[]  3[]  4[]  UN[]    6a Motor Leg - Left 0[]  1[]  2[x]  3[]  4[]  UN[]    6b Motor Leg - Right 0[]  1[]  2[x]  3[]  4[]  UN[]    7 Limb Ataxia 0[]  1[]  2[]  3[]  UN[]     8 Sensory 0[]  1[x]  2[]  UN[]    Soreness   9 Best Language 0[]  1[x]  2[]  3[]    Slowness, incorrect naming   10 Dysarthria 0[]  1[x]   2[]  UN[]      11 Extinct. and Inattention 0[x]  1[]  2[]       TOTAL: 7      ROS  Comprehensive ROS performed and pertinent positives documented in HPI   Past History   Past Medical History:  Diagnosis Date   Acute metabolic encephalopathy 07/26/2022   ATN (acute tubular necrosis) (HCC) 07/15/2014   Automatic implantable cardioverter-defibrillator in situ    Automatic implantable cardioverter-defibrillator in situ 05/04/2007   Qualifier: Diagnosis of  By: Levon Hedger     Blood transfusion without reported diagnosis    Cardiac defibrillator in situ    Atlas II VR (SJM) implanted by Dr Ladona Ridgel   CHF (congestive heart failure) (HCC)    Chronic combined systolic and diastolic heart failure (HCC)    a. EF 35-40% in past;  b. Echo 7/13:  EF 45-50%, Gr 2 diast dysfn, mild AI, mild MAC, trivial MR, mild LAE, PASP 47.   Chronic ulcer of leg (HCC)    04-09-15 resolved-not a problem.   Colon polyps 04/12/2013   Rectosigmoid polyp   COPD (chronic obstructive pulmonary disease) (HCC)    O2 at night   Depression    Diabetes mellitus    Diabetes mellitus, type  2 (HCC) 04/03/2012   HIGH RISK FEET.. Please have patient take shoes and socks off every visit for visual foot inspection.     Eczema    Elevated alkaline phosphatase level    GGT and 5'nucleotidase 8/13 normal   Health care maintenance 01/22/2013   Surgically absent cervix- no pap needed (Path report 07/2000: uterine body with attached bilateral  adnexa and separate cervix.)   History of oxygen administration    oxygen @ 2 l/m nasally bedtime 24/7   HTN (hypertension)    Hx of cardiac cath    a. LHC 2003 normal;  b. LHC 6/13:  Mild calcification in the LM, o/w normal coronary arteries, EF 45%.    Hyperkalemia 08/08/2017   Hyperlipidemia    Elevated triglyceride in 2019   Hyperlipidemia associated with type 2 diabetes mellitus (HCC) 05/25/2007   Qualifier: Diagnosis of  By: Daphine Deutscher FNP, [place]     Hyperthyroidism, subclinical     HYPERTHYROIDISM, SUBCLINICAL 05/06/2009   Qualifier: Diagnosis of  By: Flonnie Overman     Implantable cardioverter-defibrillator (ICD) generator end of life    Lipoma    Need for hepatitis C screening test 04/25/2019   NICM (nonischemic cardiomyopathy) (HCC)    Obesity    On home oxygen therapy    "2L; 24/7" (10/11/2016)   Post menopausal syndrome    Shortness of breath 07/14/2017   Skin rash 07/12/2018   Sleep apnea    pt denies 04/12/2013    Past Surgical History:  Procedure Laterality Date   ABDOMINAL HYSTERECTOMY     CARDIAC CATHETERIZATION     CARDIAC CATHETERIZATION N/A 06/21/2016   Procedure: Left Heart Cath and Coronary Angiography;  Surgeon: Tonny Bollman, MD;  Location: [facility] INVASIVE CV LAB;  Service: Cardiovascular;  Laterality: N/A;   CARDIAC DEFIBRILLATOR PLACEMENT  05/04/2007   SJM Atlas II VR ICD   CARDIAC DEFIBRILLATOR PLACEMENT     CATARACT EXTRACTION     od   COLONOSCOPY N/A 04/12/2013   Procedure: COLONOSCOPY;  Surgeon: Theda Belfast, MD;  Location: [facility] ENDOSCOPY;  Service: Endoscopy;  Laterality: N/A;  pt.has defibrilator   COLONOSCOPY N/A 04/16/2015   Procedure: COLONOSCOPY;  Surgeon: Rachael Fee, MD;  Location: [facility] ENDOSCOPY;  Service: Endoscopy;  Laterality: N/A;   COLONOSCOPY WITH PROPOFOL N/A 07/21/2023   Procedure: COLONOSCOPY WITH PROPOFOL;  Surgeon: Jeani Hawking, MD;  Location: [facility] ENDOSCOPY;  Service: Gastroenterology;  Laterality: N/A;   ESOPHAGOGASTRODUODENOSCOPY N/A 04/16/2015   Procedure: ESOPHAGOGASTRODUODENOSCOPY (EGD);  Surgeon: Rachael Fee, MD;  Location: [facility] ENDOSCOPY;  Service: Endoscopy;  Laterality: N/A;   ESOPHAGOGASTRODUODENOSCOPY (EGD) WITH PROPOFOL N/A 11/25/2022   Procedure: ESOPHAGOGASTRODUODENOSCOPY (EGD) WITH PROPOFOL;  Surgeon: Jeani Hawking, MD;  Location: [facility] ENDOSCOPY;  Service: Gastroenterology;  Laterality: N/A;   EYE SURGERY     HERNIA REPAIR     HYSTEROSCOPY     IMPLANTABLE CARDIOVERTER DEFIBRILLATOR (ICD) GENERATOR  CHANGE N/A 04/02/2014   Procedure: ICD GENERATOR CHANGE;  Surgeon: Marinus Maw, MD;  Location: [facility] CATH LAB;  Service: Cardiovascular;  Laterality: N/A;   INSERT / REPLACE / REMOVE PACEMAKER     LEFT HEART CATHETERIZATION WITH CORONARY ANGIOGRAM N/A 04/02/2012   Procedure: LEFT HEART CATHETERIZATION WITH CORONARY ANGIOGRAM;  Surgeon: Herby Abraham, MD;  Location: [facility] CATH LAB;  Service: Cardiovascular;  Laterality: N/A;   ORIF ANKLE FRACTURE Right 08/12/2019   Procedure: OPEN REDUCTION INTERNAL FIXATION (ORIF) RIGHT TRIMALLEOLAR ANKLE FRACTURE;  Surgeon: Tarry Kos, MD;  Location: [facility] OR;  Service: Orthopedics;  Laterality: Right;   RIGHT HEART CATH N/A 08/03/2022   Procedure: RIGHT HEART CATH;  Surgeon: Dolores Patty, MD;  Location: [facility] INVASIVE CV LAB;  Service: Cardiovascular;  Laterality: N/A;   SAVORY DILATION N/A 11/25/2022   Procedure: SAVORY DILATION;  Surgeon: Jeani Hawking, MD;  Location: [facility] ENDOSCOPY;  Service: Gastroenterology;  Laterality: N/A;   TEE WITHOUT CARDIOVERSION N/A 06/23/2022   Procedure: TRANSESOPHAGEAL ECHOCARDIOGRAM (TEE);  Surgeon: Parke Poisson, MD;  Location: [facility] ENDOSCOPY;  Service: Cardiology;  Laterality: N/A;   TONSILLECTOMY     TUBAL LIGATION      Family History: Family History  Problem Relation Age of Onset   Stroke Mother    Seizures Father    Heart disease Father    Diabetes Sister    Asthma Maternal Aunt        aunts   Asthma Maternal Uncle        uncles   Heart disease Paternal Aunt        aunts   Heart disease Paternal Uncle        uncles   Heart disease Maternal Aunt        aunts   Heart disease Maternal Uncle        uncles   Heart disease Maternal Grandfather    Breast cancer Daughter    Breast cancer Cousin    Asthma Grandchild    Colon cancer Neg Hx    Colon polyps Neg Hx    Esophageal cancer Neg Hx    Kidney disease Neg Hx    Gallbladder disease Neg Hx     Social History  reports that she has been smoking cigarettes.  She has a 22.5 pack-year smoking history. She has never used smokeless tobacco. She reports that she does not currently use alcohol after a past usage of about 3.0 standard drinks of alcohol per week. She reports that she does not use drugs.  Allergies  Allergen Reactions   Actos [Pioglitazone] Other (See Comments)    congestive heart failure    Naproxen Other (See Comments)    500mg  dose made her sleep for two days   Rosiglitazone Hives   Metformin And Related     Caused kidney problems    Tomato Itching   Hydrocodone-Acetaminophen Itching   Hydrocortisone Hives and Itching    Medications   Current Facility-Administered Medications:    sodium chloride 0.9 % bolus 1,000 mL, 1,000 mL, Intravenous, Once, Jacalyn Lefevre, MD  Current Outpatient Medications:    Accu-Chek Softclix Lancets lancets, Check BG as directed., Disp: 100 each, Rfl: 3   albuterol (VENTOLIN HFA) 108 (90 Base) MCG/ACT inhaler, Inhale 2 puffs into the lungs every 6 (six) hours as needed for wheezing or shortness of breath., Disp: 8 g, Rfl: 2   amiodarone (PACERONE) 200 MG tablet, Take 0.5 tablets (100 mg total) by mouth daily., Disp: 45 tablet, Rfl: 2   ANORO ELLIPTA 62.5-25 MCG/ACT AEPB, INHALE 1 PUFF INTO THE LUNGS DAILY, Disp: 60 each, Rfl: 2   apixaban (ELIQUIS) 5 MG TABS tablet, Take 1 tablet (5 mg total) by mouth 2 (two) times daily., Disp: 60 tablet, Rfl: 2   aspirin EC 81 MG tablet, Take 81 mg by mouth daily. Swallow whole., Disp: , Rfl:    atorvastatin (LIPITOR) 80 MG tablet, Take 80 mg by mouth at bedtime., Disp: , Rfl:    bisoprolol (ZEBETA) 5 MG tablet, Take 0.5 tablets (2.5 mg total) by mouth daily., Disp: 15  tablet, Rfl: 11   calcium-vitamin D (OSCAL WITH D) 500-200 MG-UNIT tablet, Take 1 tablet by mouth 2 (two) times daily., Disp: 90 tablet, Rfl: 6   Continuous Glucose Receiver (DEXCOM G7 RECEIVER) DEVI, Place new sensor every 10 days, use to monitor blood sugar continuously, Disp: 1 each, Rfl: 0    Continuous Glucose Sensor (DEXCOM G7 SENSOR) MISC, Place new sensor every 10 days. Use to monitor blood sugar continuously., Disp: 9 each, Rfl: 3   dapagliflozin propanediol (FARXIGA) 10 MG TABS tablet, Take 1 tablet (10 mg total) by mouth daily., Disp: 90 tablet, Rfl: 1   Insulin Degludec (TRESIBA [place]), Inject 10 Units into the skin daily., Disp: , Rfl:    Insulin Pen Needle (PEN NEEDLES) 32G X 4 MM MISC, 1 Needle by Does not apply route daily., Disp: 100 each, Rfl: 1   isosorbide-hydrALAZINE (BIDIL) 20-37.5 MG tablet, Take 1 tablet by mouth 3 (three) times daily., Disp: 90 tablet, Rfl: 3   Lancets Misc. (ACCU-CHEK SOFTCLIX LANCET DEV) KIT, Check BG as directed., Disp: 1 kit, Rfl: 1   levocetirizine (XYZAL) 5 MG tablet, Take 5 mg by mouth at bedtime., Disp: , Rfl:    lisinopril (ZESTRIL) 2.5 MG tablet, Take 1 tablet (2.5 mg total) by mouth daily., Disp: 90 tablet, Rfl: 0   OXYGEN, Inhale 2 L into the lungs at bedtime., Disp: , Rfl:    potassium chloride SA (KLOR-CON M) 20 MEQ tablet, TAKE 1 TABLET(20 MEQ) BY MOUTH DAILY, Disp: 90 tablet, Rfl: 0   potassium chloride SA (KLOR-CON M) 20 MEQ tablet, Take 20 mEq by mouth 2 (two) times daily., Disp: , Rfl:    Semaglutide, 1 MG/DOSE, (OZEMPIC, 1 MG/DOSE,) 2 MG/1.5ML SOPN, Inject 1 mg into the skin once a week., Disp: 3 mL, Rfl: 3   spironolactone (ALDACTONE) 25 MG tablet, Take 0.5 tablets (12.5 mg total) by mouth daily., Disp: 45 tablet, Rfl: 3   torsemide (DEMADEX) 20 MG tablet, Take 80 mg by mouth 2 (two) times daily., Disp: , Rfl:    triamcinolone ointment (KENALOG) 0.1 %, Apply 1 Application topically 2 (two) times daily. Mix with Eucerin cream, Disp: , Rfl:   Vitals   Vitals:   12/27/23 1730  BP: (!) 109/54    There is no height or weight on file to calculate BMI.  Physical Exam   Constitutional: Appears well-developed and well-nourished.  Psych: Affect appropriate to situation.  Eyes: No scleral injection.  HENT: No OP obstruction.   Head: Normocephalic.  Cardiovascular: Normal rate and regular rhythm.  Respiratory: Effort normal, non-labored breathing.  GI: Soft.  No distension. There is no tenderness.  Skin: WDI.   Neurologic Examination   Neuro: Mental Status: Patient is awake, alert, oriented to person, place, month, year, and situation. Mild dysarthria, follows commands well, difficulty with naming. Slow to respond.  Cranial Nerves: II: Visual Fields are full. Pupils are equal, round, and reactive to light.   III,IV, VI: EOMI without ptosis or diploplia.  V: Facial sensation is symmetric to temperature VII: Facial movement is symmetric resting and smiling VIII: Hearing is intact to voice X: Palate elevates symmetrically XI: Shoulder shrug is symmetric. XII: Tongue protrudes midline without atrophy or fasciculations.  Motor: Tone is normal. Bulk is normal. Full strength in bilaterally upper extremities Poor effort lifting lower extremities. Endorse soreness in right lower extremity. Pain with palpation over the hip.  Sensory: Endorses a heavy feeling in the right lower extremity  Cerebellar: FNF in bilateral upper extremity,  tremor noted in right upper extremity - patient states this is chronic Unable to complete in bilateral lower extremities   Labs/Imaging/Neurodiagnostic studies   CBC: No results for input(s): "WBC", "NEUTROABS", "HGB", "HCT", "MCV", "PLT" in the last 168 hours. Basic Metabolic Panel:  Lab Results  Component Value Date   NA 140 11/10/2023   K 4.1 11/10/2023   CO2 25 11/10/2023   GLUCOSE 143 (H) 11/10/2023   BUN 33 (H) 11/10/2023   CREATININE 1.92 (H) 11/10/2023   CALCIUM 9.3 11/10/2023   GFRNONAA 28 (L) 11/10/2023   GFRAA 78 07/14/2020   Lipid Panel:  Lab Results  Component Value Date   LDLCALC 72 01/10/2023   HgbA1c:  Lab Results  Component Value Date   HGBA1C 7.8 (A) 12/26/2023   Urine Drug Screen:     Component Value Date/Time   LABOPIA NONE DETECTED  06/26/2022 1725   COCAINSCRNUR NONE DETECTED 06/26/2022 1725   LABBENZ NONE DETECTED 06/26/2022 1725   AMPHETMU NONE DETECTED 06/26/2022 1725   THCU NONE DETECTED 06/26/2022 1725   LABBARB NONE DETECTED 06/26/2022 1725    Alcohol Level     Component Value Date/Time   ETH <5 02/05/2016 1200   INR  Lab Results  Component Value Date   INR 1.3 (H) 07/13/2022   APTT  Lab Results  Component Value Date   APTT 33 06/17/2022   AED levels: No results found for: "PHENYTOIN", "ZONISAMIDE", "LAMOTRIGINE", "LEVETIRACETA"  CT Head without contrast(Personally reviewed): 1. No evidence of acute intracranial abnormality. ASPECTS of 10. 2. Mild-to-moderate chronic small vessel ischemic disease.  Neurodiagnostics Echo 12/13/2023 EF 35-40%   ASSESSMENT   [name] is a 73 y.o. female  hx of CHF s/p defibrillator placement, CKD, COPD, depression, DM, HLD, Obesity, afib on eliquis and sleep apnea. She was initially found by her family unresponsive with BP 70/30 and HR 80. While in triage she developed right sided weakness, left facial droop, and diminished sensation on the right. Repeat BP in CT 103/61 after 500cc of fluid. On exam facial droop is resolved, she has bilateral lower extremity weakness, right sensory deficit, and mild dysarthria and mild aphasia.  MRI brain ordered, however unclear if pacemaker is compatible.  If she is unable to get in for a head CT in 12 to 24 hours to evaluate for a stroke.  Additionally we will get a carotid ultrasound. Hold eliquis until after repeat imaging. BP goal normotension, she did receive 500cc of IVF, Cr 2.40 and known CHF with EF 35-40%.   Hold off on CTA given creatinine 2.4 as well as exam not consistent with a large vessel occlusion.  Will order a carotid ultrasound and MRA head.  Overall favor her episode to be symptomatic hypotension but I do think she needs carotid ultrasound given the focality described she may have some stenosis playing a role.   Additionally significant hypertension can relate to watershed pattern strokes and she is globally weak at the time of exam  RECOMMENDATIONS  - MRI Brain wo and MRA Head vs CT head in 12-24 hours to evaluate for stroke  - Carotid US pending  - Echo completed earlier this month so would not repeat this - Lipid panel with goal LDL less than 70 or stricter if per cardiology recommendations - A1c goal less than 7%, improving from prior towards goal at 7.8% recently - Hold Eliquis for now pending MRI / repeat head CT confirming no acute process - Admit to internal medicine team, neurology  will follow-up CNS imaging and carotid ultrasound but otherwise we will sign off.  Please reach out if any acute questions or concerns arise _____________________________________________________________________    Signed, Elmer Picker, NP [facility]  Attending Neurologist's note:  I personally saw this patient, gathering history, performing a full neurologic examination, reviewing relevant labs, personally reviewing relevant imaging including head CT, and formulated the assessment and plan, adding the note above for completeness and clarity to accurately reflect my thoughts   Brooke Dare MD-PhD [facility] [phone] Available 7 AM to 7 PM, outside these hours please contact Neurologist on call listed on AMION

## 2023-12-27 NOTE — H&P (Signed)
 Date: 12/27/2023               Patient Name:  [name] MRN: [id number]  DOB: 28-Aug-1951 Age / Sex: 73 y.o., female   PCP: Kathleen Lime, MD         Medical Service: Internal Medicine Teaching Service         Attending Physician: Dr. Reymundo Poll, MD    First Contact: Dr. Annett Fabian Pager: [phone]  Second Contact: Dr. Modena Slater Pager: [phone]       After Hours (After 5p/  First Contact Pager: [phone]  weekends / holidays): Second Contact Pager: [phone]   Chief Complaint: syncope  History of Present Illness: [name] is a 73 y.o. F with pertinent PMH of atrial fibrillation on eliquis, HFrEF with ICD, COPD, T2DM, HTN, HLD, who presented following a syncopal episode today around 3PM.   She states that today she took all of her blood pressure medications including lisinopril which is new. This was started at an [facility] visit yesterday due to elevated BP readings but it is unclear if she alerted the physician that she had not taken her BP medications prior to her visit.   She was outside talking to a few of her neighbors today when she felt like she was going to faint suddenly. She told them that she was going to go lay down. She felt warm, lightheaded/dizzy, in a daze, and passed out. This was around 3P. Prior to this she felt fine, even rode around the town with some friends. She does not remember if she had weakness of the R arm and leg prior to the fainting but thinks it began after. She did not have slurred speech before or after syncopal episode, just felt like she couldn't talk or move the R side of her body. She has not had any vision or hearing changes.  She denies recent illnesses, cold, flu, fevers. She has been eating and drinking okay.   She had an episode like this about 4-5 years ago but doesn't recall what caused it at that time. She states that she was at the [facility] waiting to go back to an exam room, and when she stood to go back, she passed out.  Right  now she is asymptomatic without any dizziness or lightheadedness. Her R arm and leg are essentially back to baseline without numbness. She denies shortness of breath or palpitations. She did note some palpitations prior to this episode but has had this before.  She has had no new medications other than the lisinopril mentioned above. No other acute changes in health.  Pertinent labs/work-up thus far include Hgb 11.8; BUN/Cr 48/2.37 (baseline 30s/1.90s) with GFR 22 (baseline ~28), corrected calcium 9.2; negative ethanol; normal PT-INR; elevated aPTT 39; troponin 14; normal TSH. CT head without acute abnormality. Neurology consulted. IMTS paged for admission.  Past Medical History: Past Medical History:  Diagnosis Date   Acute metabolic encephalopathy 07/26/2022   ATN (acute tubular necrosis) (HCC) 07/15/2014   Automatic implantable cardioverter-defibrillator in situ    Automatic implantable cardioverter-defibrillator in situ 05/04/2007   Qualifier: Diagnosis of  By: Levon Hedger     Blood transfusion without reported diagnosis    Cardiac defibrillator in situ    Atlas II VR (SJM) implanted by Dr Ladona Ridgel   CHF (congestive heart failure) (HCC)    Chronic combined systolic and diastolic heart failure (HCC)    a. EF 35-40% in past;  b. Echo 7/13:  EF 45-50%, Gr 2 diast dysfn, mild AI, mild MAC, trivial MR, mild LAE, PASP 47.   Chronic ulcer of leg (HCC)    04-09-15 resolved-not a problem.   Colon polyps 04/12/2013   Rectosigmoid polyp   COPD (chronic obstructive pulmonary disease) (HCC)    O2 at night   Depression    Diabetes mellitus    Diabetes mellitus, type 2 (HCC) 04/03/2012   HIGH RISK FEET.. Please have patient take shoes and socks off every visit for visual foot inspection.     Eczema    Elevated alkaline phosphatase level    GGT and 5'nucleotidase 8/13 normal   Health care maintenance 01/22/2013   Surgically absent cervix- no pap needed (Path report 07/2000: uterine body with  attached bilateral  adnexa and separate cervix.)   History of oxygen administration    oxygen @ 2 l/m nasally bedtime 24/7   HTN (hypertension)    Hx of cardiac cath    a. LHC 2003 normal;  b. LHC 6/13:  Mild calcification in the LM, o/w normal coronary arteries, EF 45%.    Hyperkalemia 08/08/2017   Hyperlipidemia    Elevated triglyceride in 2019   Hyperlipidemia associated with type 2 diabetes mellitus (HCC) 05/25/2007   Qualifier: Diagnosis of  By: Daphine Deutscher FNP, [place]     Hyperthyroidism, subclinical    HYPERTHYROIDISM, SUBCLINICAL 05/06/2009   Qualifier: Diagnosis of  By: Flonnie Overman     Implantable cardioverter-defibrillator (ICD) generator end of life    Lipoma    Need for hepatitis C screening test 04/25/2019   NICM (nonischemic cardiomyopathy) (HCC)    Obesity    On home oxygen therapy    "2L; 24/7" (10/11/2016)   Post menopausal syndrome    Shortness of breath 07/14/2017   Skin rash 07/12/2018   Sleep apnea    pt denies 04/12/2013   Past Surgical History: Past Surgical History:  Procedure Laterality Date   ABDOMINAL HYSTERECTOMY     CARDIAC CATHETERIZATION     CARDIAC CATHETERIZATION N/A 06/21/2016   Procedure: Left Heart Cath and Coronary Angiography;  Surgeon: Tonny Bollman, MD;  Location: [facility] INVASIVE CV LAB;  Service: Cardiovascular;  Laterality: N/A;   CARDIAC DEFIBRILLATOR PLACEMENT  05/04/2007   SJM Atlas II VR ICD   CARDIAC DEFIBRILLATOR PLACEMENT     CATARACT EXTRACTION     od   COLONOSCOPY N/A 04/12/2013   Procedure: COLONOSCOPY;  Surgeon: Theda Belfast, MD;  Location: [facility] ENDOSCOPY;  Service: Endoscopy;  Laterality: N/A;  pt.has defibrilator   COLONOSCOPY N/A 04/16/2015   Procedure: COLONOSCOPY;  Surgeon: Rachael Fee, MD;  Location: [facility] ENDOSCOPY;  Service: Endoscopy;  Laterality: N/A;   COLONOSCOPY WITH PROPOFOL N/A 07/21/2023   Procedure: COLONOSCOPY WITH PROPOFOL;  Surgeon: Jeani Hawking, MD;  Location: [facility] ENDOSCOPY;  Service: Gastroenterology;   Laterality: N/A;   ESOPHAGOGASTRODUODENOSCOPY N/A 04/16/2015   Procedure: ESOPHAGOGASTRODUODENOSCOPY (EGD);  Surgeon: Rachael Fee, MD;  Location: [facility] ENDOSCOPY;  Service: Endoscopy;  Laterality: N/A;   ESOPHAGOGASTRODUODENOSCOPY (EGD) WITH PROPOFOL N/A 11/25/2022   Procedure: ESOPHAGOGASTRODUODENOSCOPY (EGD) WITH PROPOFOL;  Surgeon: Jeani Hawking, MD;  Location: [facility] ENDOSCOPY;  Service: Gastroenterology;  Laterality: N/A;   EYE SURGERY     HERNIA REPAIR     HYSTEROSCOPY     IMPLANTABLE CARDIOVERTER DEFIBRILLATOR (ICD) GENERATOR CHANGE N/A 04/02/2014   Procedure: ICD GENERATOR CHANGE;  Surgeon: Marinus Maw, MD;  Location: [facility] CATH LAB;  Service: Cardiovascular;  Laterality: N/A;   INSERT / REPLACE / REMOVE  PACEMAKER     LEFT HEART CATHETERIZATION WITH CORONARY ANGIOGRAM N/A 04/02/2012   Procedure: LEFT HEART CATHETERIZATION WITH CORONARY ANGIOGRAM;  Surgeon: Herby Abraham, MD;  Location[facility] CATH LAB;  Service: Cardiovascular;  Laterality: N/A;   ORIF ANKLE FRACTURE Right 08/12/2019   Procedure: OPEN REDUCTION INTERNAL FIXATION (ORIF) RIGHT TRIMALLEOLAR ANKLE FRACTURE;  Surgeon: Tarry Kos, MD;  Location: [facility] OR;  Service: Orthopedics;  Laterality: Right;   RIGHT HEART CATH N/A 08/03/2022   Procedure: RIGHT HEART CATH;  Surgeon: Dolores Patty, MD;  Location: [facility] INVASIVE CV LAB;  Service: Cardiovascular;  Laterality: N/A;   SAVORY DILATION N/A 11/25/2022   Procedure: SAVORY DILATION;  Surgeon: Jeani Hawking, MD;  Location: [facility] ENDOSCOPY;  Service: Gastroenterology;  Laterality: N/A;   TEE WITHOUT CARDIOVERSION N/A 06/23/2022   Procedure: TRANSESOPHAGEAL ECHOCARDIOGRAM (TEE);  Surgeon: Parke Poisson, MD;  Location: [facility] ENDOSCOPY;  Service: Cardiology;  Laterality: N/A;   TONSILLECTOMY     TUBAL LIGATION     Meds:  Current Meds  Medication Sig   albuterol (VENTOLIN HFA) 108 (90 Base) MCG/ACT inhaler Inhale 2 puffs into the lungs every 6 (six) hours as needed for wheezing or shortness of  breath.   amiodarone (PACERONE) 200 MG tablet Take 0.5 tablets (100 mg total) by mouth daily.   ANORO ELLIPTA 62.5-25 MCG/ACT AEPB INHALE 1 PUFF INTO THE LUNGS DAILY   apixaban (ELIQUIS) 5 MG TABS tablet Take 1 tablet (5 mg total) by mouth 2 (two) times daily.   aspirin EC 81 MG tablet Take 81 mg by mouth daily. Swallow whole.   atorvastatin (LIPITOR) 80 MG tablet Take 80 mg by mouth at bedtime.   bisoprolol (ZEBETA) 5 MG tablet Take 0.5 tablets (2.5 mg total) by mouth daily.   calcium-vitamin D (OSCAL WITH D) 500-200 MG-UNIT tablet Take 1 tablet by mouth 2 (two) times daily.   Continuous Glucose Receiver (DEXCOM G7 RECEIVER) DEVI Place new sensor every 10 days, use to monitor blood sugar continuously   Continuous Glucose Sensor (DEXCOM G7 SENSOR) MISC Place new sensor every 10 days. Use to monitor blood sugar continuously.   dapagliflozin propanediol (FARXIGA) 10 MG TABS tablet Take 1 tablet (10 mg total) by mouth daily.   Insulin Degludec (TRESIBA [place]) Inject 10 Units into the skin daily.   isosorbide-hydrALAZINE (BIDIL) 20-37.5 MG tablet Take 1 tablet by mouth 3 (three) times daily.   ketoconazole (NIZORAL) 2 % shampoo Apply 1 Application topically once a week.   levocetirizine (XYZAL) 5 MG tablet Take 5 mg by mouth at bedtime.   lisinopril (ZESTRIL) 2.5 MG tablet Take 1 tablet (2.5 mg total) by mouth daily.   OZEMPIC, 1 MG/DOSE, 4 MG/3ML SOPN Inject 1 mg into the skin once a week.   potassium chloride SA (KLOR-CON M) 20 MEQ tablet Take 20 mEq by mouth 2 (two) times daily.   torsemide (DEMADEX) 20 MG tablet Take 80 mg by mouth 2 (two) times daily.   triamcinolone ointment (KENALOG) 0.1 % Apply 1 Application topically 2 (two) times daily. Mix with Eucerin cream   Allergies: Allergies as of 12/27/2023 - Review Complete 12/27/2023  Allergen Reaction Noted   Actos [pioglitazone] Other (See Comments) 03/12/2009   Naproxen Other (See Comments) 02/05/2016   Rosiglitazone Hives 02/11/2016    Metformin and related  04/18/2023   Tomato Itching 06/16/2021   Hydrocodone-acetaminophen Itching 08/08/2019   Hydrocortisone Hives and Itching 10/06/2009   Family History:  Family History  Problem Relation Age of Onset  Stroke Mother    Seizures Father    Heart disease Father    Diabetes Sister    Asthma Maternal Aunt        aunts   Asthma Maternal Uncle        uncles   Heart disease Paternal Aunt        aunts   Heart disease Paternal Uncle        uncles   Heart disease Maternal Aunt        aunts   Heart disease Maternal Uncle        uncles   Heart disease Maternal Grandfather    Breast cancer Daughter    Breast cancer Cousin    Asthma Grandchild    Colon cancer Neg Hx    Colon polyps Neg Hx    Esophageal cancer Neg Hx    Kidney disease Neg Hx    Gallbladder disease Neg Hx    Social History: Follows with [facility] Lives alone but has good support through neighbors, son, and daughter. Is independent in ADLs and IADLs. Does not require ambulatory assistive devices. Smokes about 2 packs per week, started smoking around age 23. Drinks alcohol occasionally. No other drug or substance use.  Review of Systems: A complete ROS was negative except as per HPI.   Physical Exam: Blood pressure (!) 110/58, pulse 72, temperature 97.8 F (36.6 C), resp. rate 16, height 5\' 2"  (1.575 m), weight 84.2 kg, SpO2 99%. ***  EKG: personally reviewed my interpretation is NSR with prolonged QTc of 584 ms.  CT head: 1. No evidence of acute intracranial abnormality. ASPECTS of 10. 2. Mild-to-moderate chronic small vessel ischemic disease.  CXR: 1. Borderline enlargement of the cardiopericardial silhouette. 2. Scarring or subsegmental atelectasis in the left mid to lower lung, probably in the lingula. 3. Degenerative glenohumeral arthropathy bilaterally.  Assessment & Plan by Problem: Principal Problem:   Syncope  C/f TIA Syncopal episode Suspect TIA and syncopal episode are related to  hypoperfusion from anti-hypertensive regimen. Subjectively back to baseline strength, CT head without acute intracranial abnormalities. Plan: -Neurology following, appreciate their recommendations    -MRI brain, MRA head; these are ordered -Hold eliquis for now -F/u lipid panel -Continue atorvastatin 80 mg daily -F/u carotid US -Admit with telemetry monitoring -PT/OT consults  Hypertension Initially quite hypotensive on presentation, now relatively normotensive. Took all anti-hypertensive medications this morning, including lisionpril 2.5 mg daily which was added to regimen yesterday. Plan: -Check orthostatic VS in AM -Gradually resume home anti-hypertensives as permitted by BP: isosorbide-hydralazine 20-37.5 mg TID, bisoprolol 2.5 mg daily  Atrial fibrillation on eliquis Rate and rhythm on exam. Eliquis on hold in setting of possible CVA. Plan: -Continue home amiodarone 100 mg daily -Hold eliquis 5 mg BID for now.  AKI on CKD stage 4 Likely from hypoperfusion prior to arrival.  Plan: -Gradually resume home anti-hypertensives as permitted by BP -Avoid nephrotoxic agents -Continue home calcium-vitamin D 500-200 mg BID  HFrEF (LV EF 35-40%) s/p ICD placement Euvolemic on exam. Plan: -Hold home torsemide 80 mg BID for tonight given hypotension and euvolemia; resume as indicated based on exam -Continue home farxiga 10 mg daily  COPD Not concerned for acute exacerbation. Home regimen is anoro ellipta 1 puff daily, albuterol 2 puffs q6h PRN wheezing, shortness of breath. Plan: -Continue home anoro ellipta 1 puff daily, albuterol 2 puffs q6h PRN wheezing, shortness of breath  Type 2 diabetes mellitus HbA1c 7.8% yesterday. On tresiba 10 units daily as outpatient. Plan: -Continue  home tresiba at decreased dose of 5 units daily -CBG, SSI TID w/ meals; goal CBG 140-180  OSA Uses 2L O2 via [place] daily at bedtime. Plan: -Continue 2L O2 via [place] daily at bedtime  Dispo: Admit patient  to Observation with expected length of stay less than 2 midnights.  Signed: *** 12/27/2023, 9:50 PM  After 5pm on weekdays and 1pm on weekends: On Call pager: [phone]

## 2023-12-27 NOTE — ED Triage Notes (Signed)
 Pt BIBGEMS from home after being found unresponsive. Pt BP 70/30 with a hr of 80. Pt took her Lisinopril for the first time today prior to this happening. Pt is normally alert and oriented x4. While in triage pt developed right sided weakness, left sided facial droop, and decreased sensation on the right. LNW 1720  20 LAC  CBG 243 100/50 after 500 of NS

## 2023-12-27 NOTE — ED Notes (Addendum)
 The pt reports that she handed her glasses to the gems that brought her to the ed  we have searched her clothes that were bagged no glasses are to be found

## 2023-12-28 ENCOUNTER — Observation Stay (HOSPITAL_COMMUNITY)

## 2023-12-28 ENCOUNTER — Other Ambulatory Visit (HOSPITAL_COMMUNITY): Payer: Self-pay

## 2023-12-28 ENCOUNTER — Observation Stay (HOSPITAL_BASED_OUTPATIENT_CLINIC_OR_DEPARTMENT_OTHER)

## 2023-12-28 DIAGNOSIS — J449 Chronic obstructive pulmonary disease, unspecified: Secondary | ICD-10-CM | POA: Diagnosis not present

## 2023-12-28 DIAGNOSIS — I509 Heart failure, unspecified: Secondary | ICD-10-CM | POA: Diagnosis not present

## 2023-12-28 DIAGNOSIS — R531 Weakness: Secondary | ICD-10-CM | POA: Diagnosis not present

## 2023-12-28 DIAGNOSIS — I952 Hypotension due to drugs: Secondary | ICD-10-CM

## 2023-12-28 DIAGNOSIS — R200 Anesthesia of skin: Secondary | ICD-10-CM | POA: Diagnosis not present

## 2023-12-28 DIAGNOSIS — I959 Hypotension, unspecified: Secondary | ICD-10-CM

## 2023-12-28 DIAGNOSIS — I679 Cerebrovascular disease, unspecified: Secondary | ICD-10-CM | POA: Insufficient documentation

## 2023-12-28 DIAGNOSIS — R0989 Other specified symptoms and signs involving the circulatory and respiratory systems: Secondary | ICD-10-CM

## 2023-12-28 DIAGNOSIS — I6782 Cerebral ischemia: Secondary | ICD-10-CM | POA: Diagnosis not present

## 2023-12-28 DIAGNOSIS — R29818 Other symptoms and signs involving the nervous system: Secondary | ICD-10-CM | POA: Diagnosis not present

## 2023-12-28 LAB — CBG MONITORING, ED
Glucose-Capillary: 126 mg/dL — ABNORMAL HIGH (ref 70–99)
Glucose-Capillary: 113 mg/dL — ABNORMAL HIGH (ref 70–99)

## 2023-12-28 LAB — URINALYSIS, ROUTINE W REFLEX MICROSCOPIC
Bilirubin Urine: NEGATIVE
Glucose, UA: >=500 mg/dL — AB
Hgb urine dipstick: NEGATIVE
Ketones, ur: NEGATIVE mg/dL
Nitrite: NEGATIVE
Protein, ur: NEGATIVE mg/dL
Specific Gravity, Urine: 1.01 (ref 1.005–1.030)
WBC, UA: >50 WBC/hpf (ref 0–5)
pH: 7 (ref 5.0–8.0)

## 2023-12-28 LAB — CBC
HCT: 46.1 % — ABNORMAL HIGH (ref 36.0–46.0)
Hemoglobin: 14 g/dL (ref 12.0–15.0)
MCH: 26.1 pg (ref 26.0–34.0)
MCHC: 30.4 g/dL (ref 30.0–36.0)
MCV: 86 fL (ref 80.0–100.0)
Platelets: 227 10*3/uL (ref 150–400)
RBC: 5.36 MIL/uL — ABNORMAL HIGH (ref 3.87–5.11)
RDW: 15.3 % (ref 11.5–15.5)
WBC: 6.8 10*3/uL (ref 4.0–10.5)
nRBC: 0 % (ref 0.0–0.2)

## 2023-12-28 LAB — RAPID URINE DRUG SCREEN, HOSP PERFORMED
Amphetamines: NOT DETECTED
Barbiturates: NOT DETECTED
Benzodiazepines: NOT DETECTED
Cocaine: NOT DETECTED
Opiates: NOT DETECTED
Tetrahydrocannabinol: NOT DETECTED

## 2023-12-28 LAB — BASIC METABOLIC PANEL WITH GFR
Anion gap: 11 (ref 5–15)
BUN: 43 mg/dL — ABNORMAL HIGH (ref 8–23)
CO2: 22 mmol/L (ref 22–32)
Calcium: 9.5 mg/dL (ref 8.9–10.3)
Chloride: 108 mmol/L (ref 98–111)
Creatinine, Ser: 1.88 mg/dL — ABNORMAL HIGH (ref 0.44–1.00)
GFR, Estimated: 28 mL/min — ABNORMAL LOW (ref >60)
Glucose, Bld: 126 mg/dL — ABNORMAL HIGH (ref 70–99)
Potassium: 5 mmol/L (ref 3.5–5.1)
Sodium: 141 mmol/L (ref 135–145)

## 2023-12-28 LAB — LIPID PANEL
Cholesterol: 194 mg/dL (ref 0–200)
HDL: 59 mg/dL (ref >40)
LDL Cholesterol: 122 mg/dL — ABNORMAL HIGH (ref 0–99)
Total CHOL/HDL Ratio: 3.3 ratio
Triglycerides: 66 mg/dL (ref <150)
VLDL: 13 mg/dL (ref 0–40)

## 2023-12-28 MED ORDER — METRONIDAZOLE 500 MG PO TABS
500.0000 mg | ORAL_TABLET | Freq: Two times a day (BID) | ORAL | Status: DC
  Administered 2023-12-28: 500 mg via ORAL
  Filled 2023-12-28: qty 1

## 2023-12-28 MED ORDER — METRONIDAZOLE 500 MG PO TABS
500.0000 mg | ORAL_TABLET | Freq: Two times a day (BID) | ORAL | 0 refills | Status: AC
  Filled 2023-12-28: qty 13, 7d supply, fill #0

## 2023-12-28 MED ORDER — APIXABAN 5 MG PO TABS: 5.0000 mg | ORAL_TABLET | Freq: Two times a day (BID) | ORAL | Status: DC

## 2023-12-28 NOTE — Progress Notes (Deleted)
error 

## 2023-12-28 NOTE — Progress Notes (Signed)
 Carotid arterial duplex completed. Please see CV Procedures for preliminary results.  Shona Simpson, RVT 12/28/23 9:20 AM

## 2023-12-28 NOTE — Progress Notes (Signed)
 Due to this patient having an pacemaker they will need a nurse to monitor them for MRI nurse has been made aware the patient an appointment at 1100.

## 2023-12-28 NOTE — Evaluation (Signed)
 Occupational Therapy Evaluation Patient Details Name: [name] MRN: [id number] DOB: Feb 19, 1951 Today's Date: 12/28/2023   History of Present Illness   [name]. [name] is a 73 y.o. F who presented following a syncopal episode today around 3PM with some right side weakness initially.  CT scan negative for acute infarct.  PMH of atrial fibrillation on eliquis, HFrEF with ICD, COPD, T2DM, HTN, HLD,     Clinical Impressions Pt currently independent/modified independent for mobility and selfcare tasks sit to stand without assistive device.  Vitals stable throughout session with Oxygen at 95% or better and HR in the 80s.  Pt lives alone but her daughter checks on her daily and she has neighbors she sees as well.  Feel pt is at her baseline and does not need any further OT services at this time.  Will sign off.       Functional Status Assessment   Patient has not had a recent decline in their functional status     Equipment Recommendations   None recommended by OT      Precautions/Restrictions   Precautions Precautions: None Restrictions Weight Bearing Restrictions Per Provider Order: No     Mobility Bed Mobility Overal bed mobility: Independent                  Transfers Overall transfer level: Modified independent Equipment used: None               General transfer comment: Slightly slower movement noted with wider BOS during ambulation.      Balance Overall balance assessment: Mild deficits observed, not formally tested                                         ADL either performed or assessed with clinical judgement   ADL Overall ADL's : Modified independent                                       General ADL Comments: Pt is currently modified independent for selfcare tasks sit to stand and for functional transfers without use of an assistive device.  Wider BOS with gait pattern but no LOB or need for assistive  device.  She reports having a tub bench she uses at home and her daughter coming in to check on her daily.  No further acute OT needs at this time.     Vision Baseline Vision/History: 1 Wears glasses (Glasses not present in room) Patient Visual Report: No change from baseline Vision Assessment?: No apparent visual deficits     Perception Perception: Not tested       Praxis Praxis: Not tested       Pertinent Vitals/Pain Pain Assessment Pain Assessment: No/denies pain     Extremity/Trunk Assessment Upper Extremity Assessment Upper Extremity Assessment: Overall WFL for tasks assessed   Lower Extremity Assessment Lower Extremity Assessment: Overall WFL for tasks assessed   Cervical / Trunk Assessment Cervical / Trunk Assessment: Normal   Communication Communication Communication: No apparent difficulties   Cognition Arousal: Alert   Cognition: No apparent impairments                               Following commands: Intact  Home Living Family/patient expects to be discharged to:: Private residence Living Arrangements: Alone Available Help at Discharge: Other (Comment) (daughter checks on her every day, [place] check on her as well) Type of Home: Apartment Home Access: Stairs to enter Entrance Stairs-Number of Steps: 3 Entrance Stairs-Rails: Right Home Layout: One level         Bathroom Toilet: Standard     Home Equipment: Cane - single point;Rollator (4 wheels);Toilet riser;Tub bench;BSC/3in1;Wheelchair - power          Prior Functioning/Environment Prior Level of Function : Independent/Modified Independent             Mobility Comments: Has been using rollator and intermittently SPC. ADLs Comments: Family and friend assist with household cleaning and transportation, but pt reports being capable of being mod I; pt cooks own meals on stovetop.     AM-PAC OT "6 Clicks" Daily Activity     Outcome Measure Help  from another person eating meals?: None Help from another person taking care of personal grooming?: None Help from another person toileting, which includes using toliet, bedpan, or urinal?: None Help from another person bathing (including washing, rinsing, drying)?: None Help from another person to put on and taking off regular upper body clothing?: None Help from another person to put on and taking off regular lower body clothing?: None 6 Click Score: 24   End of Session Nurse Communication: Mobility status  Activity Tolerance: Patient tolerated treatment well Patient left: in bed;with call bell/phone within reach                   Time: 1012-1042 OT Time Calculation (min): 30 min Charges:  OT General Charges $OT Visit: 1 Visit OT Evaluation $OT Eval Moderate Complexity: 1 Mod OT Treatments $Self Care/Home Management : 8-22 mins  Perrin Maltese, OTR/L Acute Rehabilitation Services  Office [phone] 12/28/2023

## 2023-12-28 NOTE — Progress Notes (Signed)
 PT Cancellation Note  Patient Details Name: GILBERTE GORLEY MRN: [id number] DOB: 06/30/1951   Cancelled Treatment:    Reason Eval/Treat Not Completed: Patient at procedure or test/unavailable  RN reported pt off unit for vascular study when I visited for PT evaluation this morning. Will check back later for comprehensive PT evaluation.  Kathlyn Sacramento, PT, DPT [facility]  Rehabilitation Services Physical Therapist Office: [phone] Website: [email or web address]   Berton Mount 12/28/2023, 9:44 AM

## 2023-12-28 NOTE — ED Notes (Signed)
 Pt is looking for her glasses. Chris RN & I looked in pt belonging bag but no eyeglasses found. Pt states EMS told her they wree putting them on the back of her bed when she was being transported. Pt doesn't remember having them since that time.

## 2023-12-28 NOTE — Progress Notes (Signed)
 Internal Medicine Clinic Attending  Case discussed with the resident at the time of the visit.  We reviewed the resident's history and exam and pertinent patient test results.  I agree with the assessment, diagnosis, and plan of care documented in the resident's note.   Agree with cautious approach to starting ACE inhibitor. Please repeat BMP at upcoming visit with special attention to GFR & potassium.

## 2023-12-28 NOTE — Addendum Note (Signed)
 Addended by: Derrek Monaco on: 12/28/2023 08:57 AM   Modules accepted: Level of Service

## 2023-12-28 NOTE — Discharge Summary (Signed)
 Name: [name] MRN: [id number] DOB: 1951-04-18 73 y.o. PCP: Kathleen Lime, MD  Date of Admission: 12/27/2023  5:20 PM Date of Discharge:  12/28/2023 Attending Physician: Dr. Antony Contras  DISCHARGE DIAGNOSIS:  Primary Problem: Drug-induced hypotension   Hospital Problems: Principal Problem:   Drug-induced hypotension Active Problems:   Chronic systolic CHF (congestive heart failure) (HCC)   CKD (chronic kidney disease) stage 4, GFR 15-29 ml/min (HCC)   Syncope   Small vessel disease, cerebrovascular   DISCHARGE MEDICATIONS:   Allergies as of 12/28/2023       Reactions   Actos [pioglitazone] Other (See Comments)   congestive heart failure   Naproxen Other (See Comments)   500mg  dose made her sleep for two days   Rosiglitazone Hives   Metformin And Related    Caused kidney problems    Tomato Itching   Hydrocodone-acetaminophen Itching   Hydrocortisone Hives, Itching        Medication List     STOP taking these medications    isosorbide-hydrALAZINE 20-37.5 MG tablet Commonly known as: BiDil   lisinopril 2.5 MG tablet Commonly known as: ZESTRIL       TAKE these medications    Accu-Chek Softclix Lancet Dev Kit Check BG as directed.   Accu-Chek Softclix Lancets lancets Check BG as directed.   albuterol 108 (90 Base) MCG/ACT inhaler Commonly known as: VENTOLIN HFA Inhale 2 puffs into the lungs every 6 (six) hours as needed for wheezing or shortness of breath.   amiodarone 200 MG tablet Commonly known as: PACERONE Take 0.5 tablets (100 mg total) by mouth daily.   Anoro Ellipta 62.5-25 MCG/ACT Aepb Generic drug: umeclidinium-vilanterol INHALE 1 PUFF INTO THE LUNGS DAILY   apixaban 5 MG Tabs tablet Commonly known as: ELIQUIS Take 1 tablet (5 mg total) by mouth 2 (two) times daily.   aspirin EC 81 MG tablet Take 81 mg by mouth daily. Swallow whole.   atorvastatin 80 MG tablet Commonly known as: LIPITOR Take 80 mg by mouth at bedtime.    bisoprolol 5 MG tablet Commonly known as: ZEBETA Take 0.5 tablets (2.5 mg total) by mouth daily.   calcium-vitamin D 500-200 MG-UNIT tablet Commonly known as: OSCAL WITH D Take 1 tablet by mouth 2 (two) times daily.   dapagliflozin propanediol 10 MG Tabs tablet Commonly known as: Farxiga Take 1 tablet (10 mg total) by mouth daily.   Dexcom G7 Receiver [place] new sensor every 10 days, use to monitor blood sugar continuously   Dexcom G7 Sensor Misc Place new sensor every 10 days. Use to monitor blood sugar continuously.   ketoconazole 2 % shampoo Commonly known as: NIZORAL Apply 1 Application topically once a week.   levocetirizine 5 MG tablet Commonly known as: XYZAL Take 5 mg by mouth at bedtime.   metroNIDAZOLE 500 MG tablet Commonly known as: FLAGYL Take 1 tablet (500 mg total) by mouth 2 (two) times daily for 7 days.   OXYGEN Inhale 2 L into the lungs at bedtime.   Ozempic (1 MG/DOSE) 4 MG/3ML Sopn Generic drug: Semaglutide (1 MG/DOSE) Inject 1 mg into the skin once a week.   Pen Needles 32G X 4 MM Misc 1 Needle by Does not apply route daily.   potassium chloride SA 20 MEQ tablet Commonly known as: KLOR-CON M Take 20 mEq by mouth 2 (two) times daily. What changed: Another medication with the same name was removed. Continue taking this medication, and follow the directions you see here.  spironolactone 25 MG tablet Commonly known as: ALDACTONE Take 0.5 tablets (12.5 mg total) by mouth daily.   torsemide 20 MG tablet Commonly known as: DEMADEX Take 80 mg by mouth 2 (two) times daily.   TRESIBA [place] Inject 10 Units into the skin daily.   triamcinolone ointment 0.1 % Commonly known as: KENALOG Apply 1 Application topically 2 (two) times daily. Mix with Eucerin cream        DISPOSITION AND FOLLOW-UP:  [name] was discharged from [facility] in Good condition. At the hospital follow up visit please address:  Syncope,  symptomatic hypotension: Suspect medication-related. Discontinued lisinopril. Held BiDil for now.  Reassess blood pressure outpatient and adjust medications as necessary. Consider restarting BiDil. Ensure close follow-up with PCP and cardiology.   Trichomoniasis: Ensure completion of metronidazole course. Recommend screening for other STIs outpatient (HIV, RPR, G/C, BV).   Follow-up Appointments:  Follow-up Information     Tawkaliyar, Roya, DO. Go on 01/09/2024.   Specialty: Internal Medicine Why: Please get your hospital follow-up appointment on the above date at 10:15 AM. Contact information: [address] [place] [address] [phone]         Bensimhon, Bevelyn Buckles, MD. Call.   Specialty: Cardiology Why: Please call and make a follow up appointment with your cardiologist in the next few weeks. Contact information: [address] [place] [address] [phone]                 HOSPITAL COURSE:  Patient Summary: Syncope c/f TIA Symptomatic hypotension Presented to the hospital after an episode of syncope while standing outside talking with her neighbors.  Recently started on lisinopril in addition to her other blood pressure lowering medications.  Blood pressure was in the 70s systolic on EMS arrival and improved with a bolus of IV fluids.  Stroke workup was initiated and neuro was consulted.  CT head, MRI brain, MRA head were all negative.  Carotid ultrasound showed mild stenosis bilaterally in the carotid arteries.  Clinically, she reported that she was at her baseline and did not have any residual focal deficits.  She worked with the physical therapist and occupational therapist, who agreed that she did not have any new functional deficits.  I suspect her syncopal event was in the setting of hypotension given that she recently started lisinopril yesterday.  Per chart review, her blood pressure has been running low consistently for the past few months at  baseline.  We will hold her lisinopril and isosorbide-hydralazine for now.  We have restarted her other antihypertensive medications, which can be adjusted at follow-up. With a negative stroke workup and clinical return to baseline, she is medically stable for discharge with close outpatient follow up.   Trichomoniasis Vaginal discharge Patient has been having dark, malodorous vaginal discharge recently. Last sexual encounter was 2 months ago. Urine positive for bacteriuria, pyuria, and trichomonas.  Started on Flagyl twice daily for 7 days.  Recommend further STI screening outpatient.   Atrial fibrillation on eliquis Regular rate and rhythm during admission. On Eliquis.   CKD stage 4 Baseline Cr at 2-3. Currently stable.   HFrEF (LV EF 35-40%) s/p ICD placement Follows with Dr. Gala Romney. Recent echo on 3/12. On torsemide 80 mg BID, bisoprolol 2.5 mg daily, BiDil 1 tab 3 times daily, Farxiga 10 mg daily, spironolactone 12.5 mg daily.  COPD Continued home anoro ellipta 1 puff daily, albuterol 2 puffs q6h PRN. Outpatient follow up.   Type  2 diabetes mellitus A1c 7.8%. On tresiba 10u daily outpatient.   OSA 2L [place] at bedtime. Stable.    DISCHARGE INSTRUCTIONS:   Discharge Instructions     Call MD for:  difficulty breathing, headache or visual disturbances   Complete by: As directed    Call MD for:  extreme fatigue   Complete by: As directed    Call MD for:  persistant dizziness or light-headedness   Complete by: As directed    Call MD for:  persistant nausea and vomiting   Complete by: As directed    Call MD for:  severe uncontrolled pain   Complete by: As directed    Call MD for:  temperature >100.4   Complete by: As directed    Diet - low sodium heart healthy   Complete by: As directed    Discharge instructions   Complete by: As directed    You were hospitalized for syncope due to low blood pressure.  A stroke workup was completed, all which was negative.  You were found to  have trichomonas, a sexually transmitted infection, which we are treating with a week of antibiotics.  Thank you for allowing [place] to be part of your care.   We arranged for you to follow up at: The internal medicine clinic at the [facility] of this hospital on 01/09/2024 at 10:15 AM.  Please call the cardiologist, Dr. Gala Romney, after leaving the hospital to schedule a hospital follow up appointment in the next week or two.   Please note these changes made to your medications:   Please START taking:   Metronidazole 500 mg (1 tablet) twice a day.  Take 1 tablet tonight, and then 1 tablet twice a day for the next 6 days to treat your trichomonas infection.   Please STOP taking:   Lisinopril Isosorbide-hydralazine  These medications likely contributed to your low blood pressure.  Your outpatient doctors will work with you to either restart these medications or consider alternatives to avoid this in the future.  Please make sure to drink plenty of fluids and take it easy for the next few days.   If you experience any worsening lightheadedness and dizziness, weakness, chest pain or shortness of breath, or lose consciousness, please return to the emergency department for reevaluation.  Otherwise, please follow-up with your primary care doctor and cardiologist.  Please call our clinic if you have any questions or concerns, we may be able to help and keep you from a long and expensive emergency room wait. Our clinic and after hours phone number is [phone], the best time to call is Monday through Friday 9 am to 4 pm but there is always someone available 24/7 if you have an emergency. If you need medication refills please notify your pharmacy one week in advance and they will send [place] a request.   Increase activity slowly   Complete by: As directed        SUBJECTIVE:   Doing well this morning, no lightheadedness/dizziness or weakness. Feels at her baseline. Does note some dark, malodorous  vaginal discharge. Otherwise at her baseline and ready for discharge.   Discharge Vitals:   BP (!) 127/92   Pulse 62   Temp 97.7 F (36.5 C)   Resp 17   Ht 5\' 2"  (1.575 m)   Wt 84.2 kg   SpO2 100%   BMI 33.96 kg/m   OBJECTIVE:  Physical Exam Constitutional:      Appearance: Normal appearance.  HENT:  Head: Normocephalic and atraumatic.     Nose: Nose normal.     Mouth/Throat:     Mouth: Mucous membranes are moist.     Pharynx: Oropharynx is clear.  Eyes:     Extraocular Movements: Extraocular movements intact.     Pupils: Pupils are equal, round, and reactive to light.  Cardiovascular:     Rate and Rhythm: Normal rate and regular rhythm.     Pulses: Normal pulses.     Heart sounds: Normal heart sounds.  Pulmonary:     Effort: Pulmonary effort is normal.     Breath sounds: Normal breath sounds.  Abdominal:     General: Abdomen is flat.     Palpations: Abdomen is soft.  Musculoskeletal:        General: Normal range of motion.     Cervical back: Normal range of motion.  Neurological:     General: No focal deficit present.     Mental Status: She is alert and oriented to person, place, and time. Mental status is at baseline.  Psychiatric:        Mood and Affect: Mood normal.        Behavior: Behavior normal.     Pertinent Labs, Studies, and Procedures:     Latest Ref Rng & Units 12/28/2023    5:31 AM 12/27/2023    6:16 PM 12/27/2023    6:13 PM  CBC  WBC 4.0 - 10.5 K/uL 6.8   8.1   Hemoglobin 12.0 - 15.0 g/dL 40.9  81.1  91.4   Hematocrit 36.0 - 46.0 % 46.1  38.0  38.5   Platelets 150 - 400 K/uL 227   231        Latest Ref Rng & Units 12/28/2023    5:31 AM 12/27/2023    6:16 PM 12/27/2023    6:13 PM  CMP  Glucose 70 - 99 mg/dL 782  956  213   BUN 8 - 23 mg/dL 43  49  48   Creatinine 0.44 - 1.00 mg/dL 0.86  5.78  4.69   Sodium 135 - 145 mmol/L 141  139  138   Potassium 3.5 - 5.1 mmol/L 5.0  4.2  4.2   Chloride 98 - 111 mmol/L 108  102  103   CO2 22 - 32  mmol/L 22   29   Calcium 8.9 - 10.3 mg/dL 9.5   8.7   Total Protein 6.5 - 8.1 g/dL   6.3   Total Bilirubin 0.0 - 1.2 mg/dL   0.5   Alkaline Phos 38 - 126 U/L   105   AST 15 - 41 U/L   15   ALT 0 - 44 U/L   12     MR BRAIN WO CONTRAST Result Date: 12/28/2023 IMPRESSION: 1. No acute intracranial abnormality. 2. Mild-to-moderate chronic small vessel ischemic disease. 3. Small chronic cerebellar infarcts. 4. Negative head MRA. Electronically Signed   By: Sebastian Ache M.D.   On: 12/28/2023 13:06   MR ANGIO HEAD WO CONTRAST Result Date: 12/28/2023 IMPRESSION: 1. No acute intracranial abnormality. 2. Mild-to-moderate chronic small vessel ischemic disease. 3. Small chronic cerebellar infarcts. 4. Negative head MRA. Electronically Signed   By: Sebastian Ache M.D.   On: 12/28/2023 13:06   VAS US CAROTID Result Date: 12/28/2023 Summary: Right Carotid: Velocities in the right ICA are consistent with a 1-39% stenosis. Left Carotid: Velocities in the left ICA are consistent with a 1-39% stenosis. Vertebrals:  Bilateral vertebral arteries  demonstrate antegrade flow. Subclavians: Normal flow hemodynamics were seen in bilateral subclavian              arteries. *See table(s) above for measurements and observations.     Preliminary    DG Chest Portable 1 View Result Date: 12/27/2023 : 1. Borderline enlargement of the cardiopericardial silhouette. 2. Scarring or subsegmental atelectasis in the left mid to lower lung, probably in the lingula. 3. Degenerative glenohumeral arthropathy bilaterally. Electronically Signed   By: Gaylyn Rong M.D.   On: 12/27/2023 19:29   CT HEAD CODE STROKE WO CONTRAST Result Date: 12/27/2023 IMPRESSION: 1. No evidence of acute intracranial abnormality. ASPECTS of 10. 2. Mild-to-moderate chronic small vessel ischemic disease. Electronically Signed   By: Sebastian Ache M.D.   On: 12/27/2023 17:52     Signed: Annett Fabian, MD Internal Medicine Resident, PGY-1 [facility]  Pager: [phone] 1:59 PM, 12/28/2023

## 2023-12-28 NOTE — ED Notes (Signed)
Went to vascular 

## 2023-12-28 NOTE — Plan of Care (Signed)
 Prelim carotid US report reviewed Right Carotid: Velocities in the right ICA are consistent with a 1-39% stenosis.  Left Carotid: Velocities in the left ICA are consistent with a 1-39% stenosis.   MRI brain, MRA head report reviewed 1. No acute intracranial abnormality. 2. Mild-to-moderate chronic small vessel ischemic disease. 3. Small chronic cerebellar infarcts. 4. Negative head MRA.  Recs Okay to resume Eliquis Neurology will sign off at this time, please do re-engage [place] if other questions/concerns arise  Discussed with primary team via secure chat  Brooke Dare MD-PhD [facility] [phone] Available 7 AM to 7 PM, outside these hours please contact Neurologist on call listed on AMION

## 2023-12-28 NOTE — ED Notes (Signed)
Transported to MRI with RN

## 2023-12-28 NOTE — Progress Notes (Signed)
 Physical Therapy Note  Spoke with occupational therapy after their initial evaluation. Per report, today she is able to ambulate independently without an assistive device, demonstrating a wider BOS. Lives alone but daughter checks on her daily. No acute physical therapy indicated at this time. PT is signing-off. Please re-order if there is any significant change in status. Thank you for this referral.    Kathlyn Sacramento, PT, DPT [facility]  Rehabilitation Services Physical Therapist Office: [phone] Website: [email or web address]

## 2023-12-28 NOTE — ED Notes (Signed)
 Patient ambulated to the bathroom with a steady gait with no assistance

## 2023-12-31 LAB — URINE CULTURE: Culture: 10000 — AB

## 2024-01-01 ENCOUNTER — Telehealth (HOSPITAL_BASED_OUTPATIENT_CLINIC_OR_DEPARTMENT_OTHER): Payer: Self-pay | Admitting: *Deleted

## 2024-01-01 NOTE — Telephone Encounter (Signed)
 Post ED Visit - Positive Culture Follow-up  Culture report reviewed by antimicrobial stewardship pharmacist: [facility] [x]  Ruben Im, Pharm.D. []  Celedonio Miyamoto, Pharm.D., BCPS AQ-ID []  Garvin Fila, Pharm.D., BCPS []  Georgina Pillion, Pharm.D., BCPS []  [place], [address].D., BCPS, AAHIVP []  Estella Husk, Pharm.D., BCPS, AAHIVP []  Lysle Pearl, PharmD, BCPS []  Phillips Climes, PharmD, BCPS []  Agapito Games, PharmD, BCPS []  Verlan Friends, PharmD []  Mervyn Gay, PharmD, BCPS []  Vinnie Level, PharmD  [facility] []  Len Childs, PharmD []  Greer Pickerel, PharmD []  Adalberto Cole, PharmD []  Perlie Gold, [name] []  Lonell Face) Jean Rosenthal, PharmD []  Earl Many, PharmD []  Junita Push, PharmD []  Dorna Leitz, PharmD []  Terrilee Files, PharmD []  Lynann Beaver, PharmD []  Keturah Barre, PharmD []  Loralee Pacas, PharmD []  Bernadene Person, PharmD   Positive urine culture Treated with metronidazole, organism sensitive to the same and no further patient follow-up is required at this time.  [name] 01/01/2024, 9:34 AM

## 2024-01-08 ENCOUNTER — Other Ambulatory Visit: Payer: Self-pay | Admitting: Student

## 2024-01-08 DIAGNOSIS — J984 Other disorders of lung: Secondary | ICD-10-CM

## 2024-01-08 NOTE — Telephone Encounter (Signed)
 Medication sent to pharmacy

## 2024-01-09 ENCOUNTER — Ambulatory Visit

## 2024-01-09 ENCOUNTER — Ambulatory Visit: Admitting: Student

## 2024-01-09 VITALS — BP 86/42 | HR 79 | Temp 97.6°F | Wt 183.8 lb

## 2024-01-09 VITALS — BP 86/42 | HR 79 | Temp 97.6°F | Ht 62.0 in | Wt 183.8 lb

## 2024-01-09 DIAGNOSIS — K3184 Gastroparesis: Secondary | ICD-10-CM

## 2024-01-09 DIAGNOSIS — A599 Trichomoniasis, unspecified: Secondary | ICD-10-CM | POA: Diagnosis not present

## 2024-01-09 DIAGNOSIS — E1143 Type 2 diabetes mellitus with diabetic autonomic (poly)neuropathy: Secondary | ICD-10-CM

## 2024-01-09 DIAGNOSIS — Z7985 Long-term (current) use of injectable non-insulin antidiabetic drugs: Secondary | ICD-10-CM

## 2024-01-09 DIAGNOSIS — I10 Essential (primary) hypertension: Secondary | ICD-10-CM | POA: Diagnosis not present

## 2024-01-09 DIAGNOSIS — Z794 Long term (current) use of insulin: Secondary | ICD-10-CM

## 2024-01-09 DIAGNOSIS — Z7984 Long term (current) use of oral hypoglycemic drugs: Secondary | ICD-10-CM | POA: Diagnosis not present

## 2024-01-09 DIAGNOSIS — I5032 Chronic diastolic (congestive) heart failure: Secondary | ICD-10-CM | POA: Diagnosis not present

## 2024-01-09 DIAGNOSIS — Z0000 Encounter for general adult medical examination without abnormal findings: Secondary | ICD-10-CM

## 2024-01-09 DIAGNOSIS — I110 Hypertensive heart disease with heart failure: Secondary | ICD-10-CM

## 2024-01-09 NOTE — Progress Notes (Unsigned)
 Established Patient Office Visit  Subjective   Patient ID: [name], female    DOB: November 22, 1950  Age: 73 y.o. MRN: [id number]  No chief complaint on file.   HPI This is a 73 year old female with past medical history of HFrEF (EF 35 to 40%) status post ICD placement, atrial fibrillation on Eliquis, COPD, type 2 diabetes, OSA on 2 L oxygen at home, hypertension who presents today after a hospital follow-up.  She was seen on 12/27/2023 for symptomatic hypotension/syncope secondary to starting new blood pressure medications.   Past Medical History:  Diagnosis Date   Acute metabolic encephalopathy 07/26/2022   ATN (acute tubular necrosis) (HCC) 07/15/2014   Automatic implantable cardioverter-defibrillator in situ    Automatic implantable cardioverter-defibrillator in situ 05/04/2007   Qualifier: Diagnosis of  By: Levon Hedger     Blood transfusion without reported diagnosis    Cardiac defibrillator in situ    Atlas II VR (SJM) implanted by Dr Ladona Ridgel   CHF (congestive heart failure) (HCC)    Chronic combined systolic and diastolic heart failure (HCC)    a. EF 35-40% in past;  b. Echo 7/13:  EF 45-50%, Gr 2 diast dysfn, mild AI, mild MAC, trivial MR, mild LAE, PASP 47.   Chronic ulcer of leg (HCC)    04-09-15 resolved-not a problem.   Colon polyps 04/12/2013   Rectosigmoid polyp   COPD (chronic obstructive pulmonary disease) (HCC)    O2 at night   Depression    Diabetes mellitus    Diabetes mellitus, type 2 (HCC) 04/03/2012   HIGH RISK FEET.. Please have patient take shoes and socks off every visit for visual foot inspection.     Eczema    Elevated alkaline phosphatase level    GGT and 5'nucleotidase 8/13 normal   Health care maintenance 01/22/2013   Surgically absent cervix- no pap needed (Path report 07/2000: uterine body with attached bilateral  adnexa and separate cervix.)   History of oxygen administration    oxygen @ 2 l/m nasally bedtime 24/7   HTN (hypertension)     Hx of cardiac cath    a. LHC 2003 normal;  b. LHC 6/13:  Mild calcification in the LM, o/w normal coronary arteries, EF 45%.    Hyperkalemia 08/08/2017   Hyperlipidemia    Elevated triglyceride in 2019   Hyperlipidemia associated with type 2 diabetes mellitus (HCC) 05/25/2007   Qualifier: Diagnosis of  By: Daphine Deutscher FNP, [place]     Hyperthyroidism, subclinical    HYPERTHYROIDISM, SUBCLINICAL 05/06/2009   Qualifier: Diagnosis of  By: Flonnie Overman     Implantable cardioverter-defibrillator (ICD) generator end of life    Lipoma    Need for hepatitis C screening test 04/25/2019   NICM (nonischemic cardiomyopathy) (HCC)    Obesity    On home oxygen therapy    "2L; 24/7" (10/11/2016)   Post menopausal syndrome    Shortness of breath 07/14/2017   Skin rash 07/12/2018   Sleep apnea    pt denies 04/12/2013      ROS    Objective:     There were no vitals taken for this visit. BP Readings from Last 3 Encounters:  12/28/23 134/65  12/26/23 (!) 148/73  11/10/23 (!) 92/58   Wt Readings from Last 3 Encounters:  12/27/23 185 lb 11.2 oz (84.2 kg)  12/26/23 185 lb 11.2 oz (84.2 kg)  11/10/23 178 lb 12.8 oz (81.1 kg)   SpO2 Readings from Last 3 Encounters:  12/28/23 99%  12/26/23 95%  11/10/23 95%      Physical Exam   No results found for any visits on 01/09/24.  Last CBC Lab Results  Component Value Date   WBC 6.8 12/28/2023   HGB 14.0 12/28/2023   HCT 46.1 (H) 12/28/2023   MCV 86.0 12/28/2023   MCH 26.1 12/28/2023   RDW 15.3 12/28/2023   PLT 227 12/28/2023   Last metabolic panel Lab Results  Component Value Date   GLUCOSE 126 (H) 12/28/2023   NA 141 12/28/2023   K 5.0 12/28/2023   CL 108 12/28/2023   CO2 22 12/28/2023   BUN 43 (H) 12/28/2023   CREATININE 1.88 (H) 12/28/2023   GFRNONAA 28 (L) 12/28/2023   CALCIUM 9.5 12/28/2023   PHOS 4.5 06/16/2021   PROT 6.3 (L) 12/27/2023   ALBUMIN 3.4 (L) 12/27/2023   LABGLOB 3.2 06/27/2022   AGRATIO 0.7 06/27/2022    BILITOT 0.5 12/27/2023   ALKPHOS 105 12/27/2023   AST 15 12/27/2023   ALT 12 12/27/2023   ANIONGAP 11 12/28/2023   Last lipids Lab Results  Component Value Date   CHOL 194 12/28/2023   HDL 59 12/28/2023   LDLCALC 122 (H) 12/28/2023   LDLDIRECT 122.3 03/06/2007   TRIG 66 12/28/2023   CHOLHDL 3.3 12/28/2023   Last hemoglobin A1c Lab Results  Component Value Date   HGBA1C 7.8 (A) 12/26/2023      The ASCVD Risk score (Arnett DK, et al., 2019) failed to calculate for the following reasons:   Risk score cannot be calculated because patient has a medical history suggesting prior/existing ASCVD    Assessment & Plan:   HTN 12/27/2023: Recently seen in the ED for syncope 2/2 to starting lisinopril 2.5 mg, was found to be hypotensive with BP 70/30, given IVF - No dizziness, lightheaded, no chest pain or SOB   Chronic systolic CHF  - ECHO 35-45  - Continue torsemide 80 mg bid  - Continue BiDil 1 tab  - Continue Farxiga 10 mg daily. - Continue spiro 12.5 mg daily. - Continue Bisoprolol 2.5 mg daily  = Feb 05 2024  - GIVEN FLUIDS - BMP - HALF BIDIL   Afib -  amio 100 mg daily.  - Continue Eliquis 2.5 mg bid.  Trichomoniasis - Discharged with Metronidazole - STI screening: HIV, RPR, G/C, BV - DOES NOT WANT TO BE SCREEN    Diabetes - Farxiga - Ozempic 1 mg, Tuesdays  - Tresiba 10 units morning    Problem List Items Addressed This Visit   None   No follow-ups on file.    Jeral Pinch, DO

## 2024-01-09 NOTE — Patient Instructions (Signed)
 Thank you, [name] for allowing [place] to provide your care today. Today we discussed;  For your BLOOD PRESSURE: - Continue torsemide 80 mg, two times a day  - TAKE HALF OF BIDIL, three times a day - Continue Farxiga 10 mg daily. - Continue spiro 12.5 mg daily. - Continue Bisoprolol 2.5 mg daily   I have ordered the following labs for you:   Lab Orders         BMP8+Anion Gap      Tests ordered today:  BMP  Referrals ordered today:   Referral Orders  No referral(s) requested today     I have ordered the following medication/changed the following medications:   Stop the following medications: There are no discontinued medications.   Start the following medications: No orders of the defined types were placed in this encounter.    Follow up:  1 month HTN      Remember:   Should you have any questions or concerns please call the internal medicine clinic at [phone].     Jeral Pinch, DO [facility]

## 2024-01-09 NOTE — Progress Notes (Signed)
 Subjective:   [name] is a 73 y.o. female who presents for Medicare Annual (Subsequent) preventive examination.  Visit Complete: In person  Patient Medicare AWV questionnaire was completed by the patient on 01/09/24; I have confirmed that all information answered by patient is correct and no changes since this date.        Objective:    Today's Vitals   01/09/24 1119 01/09/24 1121  BP: (!) 95/54 (!) 86/42  Pulse: 83 79  Temp: 97.6 F (36.4 C)   TempSrc: Oral   SpO2: 98%   Weight: 183 lb 12.8 oz (83.4 kg)   Height: 5\' 2"  (1.575 m)   PainSc:  0-No pain   Body mass index is 33.62 kg/m.     01/09/2024   11:22 AM 12/27/2023    8:00 PM 08/01/2023    9:56 AM 07/21/2023    9:51 AM 05/02/2023   10:25 AM 03/16/2023    2:53 PM 03/09/2023   11:50 AM  Advanced Directives  Does Patient Have a Medical Advance Directive? No No No No No No No  Would patient like information on creating a medical advance directive? No - Patient declined No - Patient declined No - Patient declined No - Patient declined No - Guardian declined No - Patient declined No - Patient declined    Current Medications (verified) Outpatient Encounter Medications as of 01/09/2024  Medication Sig   ANORO ELLIPTA 62.5-25 MCG/ACT AEPB INHALE 1 PUFF INTO THE LUNGS DAILY   Accu-Chek Softclix Lancets lancets Check BG as directed.   albuterol (VENTOLIN HFA) 108 (90 [name]) MCG/ACT inhaler Inhale 2 puffs into the lungs every 6 (six) hours as needed for wheezing or shortness of breath.   amiodarone (PACERONE) 200 MG tablet Take 0.5 tablets (100 mg total) by mouth daily.   apixaban (ELIQUIS) 5 MG TABS tablet Take 1 tablet (5 mg total) by mouth 2 (two) times daily.   aspirin EC 81 MG tablet Take 81 mg by mouth daily. Swallow whole.   atorvastatin (LIPITOR) 80 MG tablet Take 80 mg by mouth at bedtime.   bisoprolol (ZEBETA) 5 MG tablet Take 0.5 tablets (2.5 mg total) by mouth daily.   calcium-vitamin D (OSCAL WITH D) 500-200  MG-UNIT tablet Take 1 tablet by mouth 2 (two) times daily.   Continuous Glucose Receiver (DEXCOM G7 RECEIVER) DEVI Place new sensor every 10 days, use to monitor blood sugar continuously   Continuous Glucose Sensor (DEXCOM G7 SENSOR) MISC Place new sensor every 10 days. Use to monitor blood sugar continuously.   dapagliflozin propanediol (FARXIGA) 10 MG TABS tablet Take 1 tablet (10 mg total) by mouth daily.   Insulin Degludec (TRESIBA Salmon) Inject 10 Units into the skin daily.   Insulin Pen Needle (PEN NEEDLES) 32G X 4 MM MISC 1 Needle by Does not apply route daily.   ketoconazole (NIZORAL) 2 % shampoo Apply 1 Application topically once a week.   Lancets Misc. (ACCU-CHEK SOFTCLIX LANCET DEV) KIT Check BG as directed.   levocetirizine (XYZAL) 5 MG tablet Take 5 mg by mouth at bedtime.   OXYGEN Inhale 2 L into the lungs at bedtime.   OZEMPIC, 1 MG/DOSE, 4 MG/3ML SOPN Inject 1 mg into the skin once a week.   potassium chloride SA (KLOR-CON M) 20 MEQ tablet Take 20 mEq by mouth 2 (two) times daily.   spironolactone (ALDACTONE) 25 MG tablet Take 0.5 tablets (12.5 mg total) by mouth daily. (Patient not taking: Reported on 12/27/2023)  torsemide (DEMADEX) 20 MG tablet Take 80 mg by mouth 2 (two) times daily.   triamcinolone ointment (KENALOG) 0.1 % Apply 1 Application topically 2 (two) times daily. Mix with Eucerin cream   No facility-administered encounter medications on file as of 01/09/2024.    Allergies (verified) Actos [pioglitazone], Naproxen, Rosiglitazone, Metformin and related, Tomato, Hydrocodone-acetaminophen, and Hydrocortisone   History: Past Medical History:  Diagnosis Date   Acute metabolic encephalopathy 07/26/2022   ATN (acute tubular necrosis) (HCC) 07/15/2014   Automatic implantable cardioverter-defibrillator in situ    Automatic implantable cardioverter-defibrillator in situ 05/04/2007   Qualifier: Diagnosis of  By: Levon Hedger     Blood transfusion without reported  diagnosis    Cardiac defibrillator in situ    Atlas II VR (SJM) implanted by Dr Ladona Ridgel   CHF (congestive heart failure) (HCC)    Chronic combined systolic and diastolic heart failure (HCC)    a. EF 35-40% in past;  b. Echo 7/13:  EF 45-50%, Gr 2 diast dysfn, mild AI, mild MAC, trivial MR, mild LAE, PASP 47.   Chronic ulcer of leg (HCC)    04-09-15 resolved-not a problem.   Colon polyps 04/12/2013   Rectosigmoid polyp   COPD (chronic obstructive pulmonary disease) (HCC)    O2 at night   Depression    Diabetes mellitus    Diabetes mellitus, type 2 (HCC) 04/03/2012   HIGH RISK FEET.. Please have patient take shoes and socks off every visit for visual foot inspection.     Eczema    Elevated alkaline phosphatase level    GGT and 5'nucleotidase 8/13 normal   Health care maintenance 01/22/2013   Surgically absent cervix- no pap needed (Path report 07/2000: uterine body with attached bilateral  adnexa and separate cervix.)   History of oxygen administration    oxygen @ 2 l/m nasally bedtime 24/7   HTN (hypertension)    Hx of cardiac cath    a. LHC 2003 normal;  b. LHC 6/13:  Mild calcification in the LM, o/w normal coronary arteries, EF 45%.    Hyperkalemia 08/08/2017   Hyperlipidemia    Elevated triglyceride in 2019   Hyperlipidemia associated with type 2 diabetes mellitus (HCC) 05/25/2007   Qualifier: Diagnosis of  By: Daphine Deutscher FNP, [place]     Hyperthyroidism, subclinical    HYPERTHYROIDISM, SUBCLINICAL 05/06/2009   Qualifier: Diagnosis of  By: Flonnie Overman     Implantable cardioverter-defibrillator (ICD) generator end of life    Lipoma    Need for hepatitis C screening test 04/25/2019   NICM (nonischemic cardiomyopathy) (HCC)    Obesity    On home oxygen therapy    "2L; 24/7" (10/11/2016)   Post menopausal syndrome    Shortness of breath 07/14/2017   Skin rash 07/12/2018   Sleep apnea    pt denies 04/12/2013   Past Surgical History:  Procedure Laterality Date   ABDOMINAL  HYSTERECTOMY     CARDIAC CATHETERIZATION     CARDIAC CATHETERIZATION N/A 06/21/2016   Procedure: Left Heart Cath and Coronary Angiography;  Surgeon: Tonny Bollman, MD;  Location: [facility] INVASIVE CV LAB;  Service: Cardiovascular;  Laterality: N/A;   CARDIAC DEFIBRILLATOR PLACEMENT  05/04/2007   SJM Atlas II VR ICD   CARDIAC DEFIBRILLATOR PLACEMENT     CATARACT EXTRACTION     od   COLONOSCOPY N/A 04/12/2013   Procedure: COLONOSCOPY;  Surgeon: Theda Belfast, MD;  Location: [facility] ENDOSCOPY;  Service: Endoscopy;  Laterality: N/A;  pt.has defibrilator   COLONOSCOPY  N/A 04/16/2015   Procedure: COLONOSCOPY;  Surgeon: Rachael Fee, MD;  Location: [facility] ENDOSCOPY;  Service: Endoscopy;  Laterality: N/A;   COLONOSCOPY WITH PROPOFOL N/A 07/21/2023   Procedure: COLONOSCOPY WITH PROPOFOL;  Surgeon: Jeani Hawking, MD;  Location: [facility] ENDOSCOPY;  Service: Gastroenterology;  Laterality: N/A;   ESOPHAGOGASTRODUODENOSCOPY N/A 04/16/2015   Procedure: ESOPHAGOGASTRODUODENOSCOPY (EGD);  Surgeon: Rachael Fee, MD;  Location: [facility] ENDOSCOPY;  Service: Endoscopy;  Laterality: N/A;   ESOPHAGOGASTRODUODENOSCOPY (EGD) WITH PROPOFOL N/A 11/25/2022   Procedure: ESOPHAGOGASTRODUODENOSCOPY (EGD) WITH PROPOFOL;  Surgeon: Jeani Hawking, MD;  Location: [facility] ENDOSCOPY;  Service: Gastroenterology;  Laterality: N/A;   EYE SURGERY     HERNIA REPAIR     HYSTEROSCOPY     IMPLANTABLE CARDIOVERTER DEFIBRILLATOR (ICD) GENERATOR CHANGE N/A 04/02/2014   Procedure: ICD GENERATOR CHANGE;  Surgeon: Marinus Maw, MD;  Location: [facility] CATH LAB;  Service: Cardiovascular;  Laterality: N/A;   INSERT / REPLACE / REMOVE PACEMAKER     LEFT HEART CATHETERIZATION WITH CORONARY ANGIOGRAM N/A 04/02/2012   Procedure: LEFT HEART CATHETERIZATION WITH CORONARY ANGIOGRAM;  Surgeon: Herby Abraham, MD;  Location: [facility] CATH LAB;  Service: Cardiovascular;  Laterality: N/A;   ORIF ANKLE FRACTURE Right 08/12/2019   Procedure: OPEN REDUCTION INTERNAL FIXATION (ORIF) RIGHT  TRIMALLEOLAR ANKLE FRACTURE;  Surgeon: Tarry Kos, MD;  Location: [facility] OR;  Service: Orthopedics;  Laterality: Right;   RIGHT HEART CATH N/A 08/03/2022   Procedure: RIGHT HEART CATH;  Surgeon: Dolores Patty, MD;  Location: [facility] INVASIVE CV LAB;  Service: Cardiovascular;  Laterality: N/A;   SAVORY DILATION N/A 11/25/2022   Procedure: SAVORY DILATION;  Surgeon: Jeani Hawking, MD;  Location: [facility] ENDOSCOPY;  Service: Gastroenterology;  Laterality: N/A;   TEE WITHOUT CARDIOVERSION N/A 06/23/2022   Procedure: TRANSESOPHAGEAL ECHOCARDIOGRAM (TEE);  Surgeon: Parke Poisson, MD;  Location: [facility] ENDOSCOPY;  Service: Cardiology;  Laterality: N/A;   TONSILLECTOMY     TUBAL LIGATION     Family History  Problem Relation Age of Onset   Stroke Mother    Seizures Father    Heart disease Father    Diabetes Sister    Asthma Maternal Aunt        aunts   Asthma Maternal Uncle        uncles   Heart disease Paternal Aunt        aunts   Heart disease Paternal Uncle        uncles   Heart disease Maternal Aunt        aunts   Heart disease Maternal Uncle        uncles   Heart disease Maternal Grandfather    Breast cancer Daughter    Breast cancer Cousin    Asthma Grandchild    Colon cancer Neg Hx    Colon polyps Neg Hx    Esophageal cancer Neg Hx    Kidney disease Neg Hx    Gallbladder disease Neg Hx    Social History   Socioeconomic History   Marital status: Significant Other    Spouse name: Not on file   Number of children: 3   Years of education: 11   Highest education level: 11th grade  Occupational History   Occupation: disabled  Tobacco Use   Smoking status: Every Day    Current packs/day: 0.50    Average packs/day: 0.5 packs/day for 45.0 years (22.5 ttl pk-yrs)    Types: Cigarettes   Smokeless tobacco: Never   Tobacco comments:  currently smoking  10 cigs per day  Vaping Use   Vaping status: Never Used  Substance and Sexual Activity   Alcohol use: Not Currently     Alcohol/week: 3.0 standard drinks of alcohol    Types: 3 Shots of liquor per week   Drug use: No   Sexual activity: Yes    Partners: Male    Birth control/protection: Surgical  Other Topics Concern   Not on file  Social History Narrative   ** Merged History Encounter **       Married   Patient enjoys going to [employer], church and spending time with family   Social Drivers of Corporate investment banker Strain: Low Risk  (01/09/2024)   Overall Financial Resource Strain (CARDIA)    Difficulty of Paying Living Expenses: Not hard at all  Food Insecurity: No Food Insecurity (01/09/2024)   Hunger Vital Sign    Worried About Running Out of Food in the Last Year: Never true    Ran Out of Food in the Last Year: Never true  Transportation Needs: No Transportation Needs (01/09/2024)   PRAPARE - Administrator, Civil Service (Medical): No    Lack of Transportation (Non-Medical): No  Physical Activity: Inactive (01/09/2024)   Exercise Vital Sign    Days of Exercise per Week: 0 days    Minutes of Exercise per Session: 0 min  Stress: No Stress Concern Present (01/09/2024)   [employer] of Occupational Health - Occupational Stress Questionnaire    Feeling of Stress : Not at all  Social Connections: Moderately Integrated (01/09/2024)   Social Connection and Isolation Panel [NHANES]    Frequency of Communication with Friends and Family: More than three times a week    Frequency of Social Gatherings with Friends and Family: More than three times a week    Attends Religious Services: More than 4 times per year    Active Member of [employer] or Organizations: Yes    Attends Banker Meetings: More than 4 times per year    Marital Status: Widowed    Tobacco Counseling Ready to quit: Not Answered Counseling given: Not Answered Tobacco comments: currently smoking  10 cigs per day   Clinical Intake:  Pre-visit preparation completed: Yes  Pain : No/denies pain Pain Score:  0-No pain     Nutritional Risks: None Diabetes: Yes CBG done?: No Did pt. bring in CBG monitor from home?: No  How often do you need to have someone help you when you read instructions, pamphlets, or other written materials from your doctor or pharmacy?: 1 - Never What is the last grade level you completed in school?: 1th grade  Interpreter Needed?: No  Information entered by :: Emilyanne Mcgough,cma   Activities of Daily Living    01/09/2024   11:21 AM 12/27/2023    8:00 PM  In your present state of health, do you have any difficulty performing the following activities:  Hearing? 0 0  Vision? 0 0  Difficulty concentrating or making decisions? 0 0  Walking or climbing stairs? 0   Dressing or bathing? 0   Doing errands, shopping? 0 0    Patient Care Team: Kathleen Lime, MD as PCP - General Marinus Maw, MD as PCP - Electrophysiology (Cardiology) Bensimhon, Bevelyn Buckles, MD as PCP - Advanced Heart Failure (Cardiology) Sinda Du, MD as Consulting Physician (Ophthalmology) Marinus Maw, MD as Consulting Physician (Cardiology)  Indicate any recent Medical Services you may have  received from other than [facility] providers in the past year (date may be approximate).     Assessment:   This is a routine wellness examination for Aijalon.  Hearing/Vision screen No results found.   Goals Addressed   None   Depression Screen    01/09/2024   11:22 AM 01/09/2024   10:32 AM 12/26/2023    9:00 AM 08/01/2023    9:57 AM 05/02/2023   10:23 AM 03/16/2023    4:40 PM 01/10/2023   10:10 AM  PHQ 2/9 Scores  PHQ - 2 Score 0 0 0 0 0 2 0  PHQ- 9 Score 0 0 0 4 4 10      Fall Risk    01/09/2024   11:22 AM 01/09/2024   10:32 AM 12/26/2023    9:00 AM 08/01/2023    9:57 AM 05/02/2023   10:23 AM  Fall Risk   Falls in the past year? 0 0 0 0 0  Number falls in past yr: 0 0 0  0  Injury with Fall? 0 0 0  0  Risk for fall due to : No Fall Risks No Fall Risks No Fall Risks Impaired balance/gait;No Fall  Risks No Fall Risks  Follow up Falls evaluation completed;Falls prevention discussed Falls evaluation completed;Falls prevention discussed Falls evaluation completed;Falls prevention discussed Falls evaluation completed Falls prevention discussed;Falls evaluation completed    MEDICARE RISK AT HOME:    TIMED UP AND GO:  Was the test performed?  No    Cognitive Function:        01/09/2024   11:22 AM 12/09/2022   11:07 AM  6CIT Screen  What Year? 0 points 0 points  What month? 0 points 0 points  What time? 0 points 0 points  Count back from 20 0 points 0 points  Months in reverse 0 points 0 points  Repeat phrase 0 points 0 points  Total Score 0 points 0 points    Immunizations Immunization History  Administered Date(s) Administered   Fluad Quad(high Dose 65+) 06/24/2021, 08/18/2022   Fluad Trivalent(High Dose 65+) 08/01/2023   H1N1 10/27/2008   Influenza Whole 08/23/2007, 07/29/2009, 08/06/2010   Influenza,inj,Quad PF,6+ Mos 06/28/2013, 07/13/2014, 06/02/2015, 06/01/2016, 06/21/2017, 06/13/2018, 06/19/2019, 07/27/2020   Moderna SARS-COV2 Booster Vaccination 07/28/2020   Moderna Sars-Covid-2 Vaccination 11/25/2019, 12/31/2019   PNEUMOCOCCAL CONJUGATE-20 08/18/2022   Pneumococcal Polysaccharide-23 06/20/2008, 07/13/2014, 07/13/2015, 07/15/2017   Td 10/03/2001   Tdap 07/09/2013    TDAP status: Due, Education has been provided regarding the importance of this vaccine. Advised may receive this vaccine at local pharmacy or Health Dept. Aware to provide a copy of the vaccination record if obtained from local pharmacy or Health Dept. Verbalized acceptance and understanding.  Flu Vaccine status: Up to date  Pneumococcal vaccine status: Up to date  Covid-19 vaccine status: Completed vaccines  Qualifies for Shingles Vaccine? Yes   Zostavax completed No   Shingrix Completed?: No.    Education has been provided regarding the importance of this vaccine. Patient has been advised to  call insurance company to determine out of pocket expense if they have not yet received this vaccine. Advised may also receive vaccine at local pharmacy or Health Dept. Verbalized acceptance and understanding.  Screening Tests Health Maintenance  Topic Date Due   Zoster Vaccines- Shingrix (1 of 2) Never done   COVID-19 Vaccine (3 - Moderna risk series) 08/25/2020   DTaP/Tdap/Td (3 - Td or Tdap) 07/10/2023   Lung Cancer Screening  07/26/2023   HEMOGLOBIN A1C  03/27/2024   Diabetic kidney evaluation - Urine ACR  05/01/2024   INFLUENZA VACCINE  05/03/2024   OPHTHALMOLOGY EXAM  10/03/2024   FOOT EXAM  12/25/2024   Diabetic kidney evaluation - eGFR measurement  12/27/2024   LIPID PANEL  12/27/2024   Medicare Annual Wellness (AWV)  01/08/2025   MAMMOGRAM  05/10/2025   Colonoscopy  07/20/2033   Pneumonia Vaccine 60+ Years old  Completed   DEXA SCAN  Completed   Hepatitis C Screening  Completed   HPV VACCINES  Aged Out    Health Maintenance  Health Maintenance Due  Topic Date Due   Zoster Vaccines- Shingrix (1 of 2) Never done   COVID-19 Vaccine (3 - Moderna risk series) 08/25/2020   DTaP/Tdap/Td (3 - Td or Tdap) 07/10/2023   Lung Cancer Screening  07/26/2023    Colorectal cancer screening: Type of screening: Colonoscopy. Completed 07/21/23. Repeat every 10 years  Mammogram status: Ordered pt scheduled for 05/13/24. Pt provided with contact info and advised to call to schedule appt.     Lung Cancer Screening: (Low Dose CT Chest recommended if Age 62-80 years, 20 pack-year currently smoking OR have quit w/in 15years.) does not qualify.   Lung Cancer Screening Referral: N/A  Additional Screening:  Hepatitis C Screening: does not qualify; Completed 06/28/2022   Vision Screening: Recommended annual ophthalmology exams for early detection of glaucoma and other disorders of the eye. Is the patient up to date with their annual eye exam?  Yes  Who is the provider or what is the name  of the office in which the patient attends annual eye exams? Dr.Bowen If pt is not established with a provider, would they like to be referred to a provider to establish care? No .   Dental Screening: Recommended annual dental exams for proper oral hygiene  Diabetic Foot Exam: Diabetic Foot Exam: Completed 12/26/2023  Community Resource Referral / Chronic Care Management: CRR required this visit?  No   CCM required this visit?  No     Plan:     I have personally reviewed and noted the following in the patient's chart:   Medical and social history Use of alcohol, tobacco or illicit drugs  Current medications and supplements including opioid prescriptions. Patient is not currently taking opioid prescriptions. Functional ability and status Nutritional status Physical activity Advanced directives List of other physicians Hospitalizations, surgeries, and ER visits in previous 12 months Vitals Screenings to include cognitive, depression, and falls Referrals and appointments  In addition, I have reviewed and discussed with patient certain preventive protocols, quality metrics, and best practice recommendations. A written personalized care plan for preventive services as well as general preventive health recommendations were provided to patient.     Cala Bradford, CMA   01/09/2024   After Visit Summary: (In Person-Printed) AVS printed and given to the patient  Nurse Notes: Face-To-Face Visit  Ms. Vinciguerra , Thank you for taking time to come for your Medicare Wellness Visit. I appreciate your ongoing commitment to your health goals. Please review the following plan we discussed and let me know if I can assist you in the future.   These are the goals we discussed:  Goals      Blood Pressure < 130/90     BP Readings from Last 3 Encounters:  05/06/20 (!) 110/57  04/29/20 (!) 134/60  04/08/20 (!) 155/67    Offered to assist patient with teaching her how to take her blood pressure the  next time she  is in the clinic.     HEMOGLOBIN A1C < 7     Lab Results  Component Value Date   HGBA1C 10.7 (A) 04/08/2020   HGBA1C 9.6 (A) 12/26/2019   HGBA1C 9.1 (H) 08/08/2019   Lab Results  Component Value Date   MICROALBUR 23.57 (H) 04/08/2014   LDLCALC 93 04/29/2020   CREATININE 0.84 04/08/2020     Not meeting Hgb A1C target     LDL CALC < 70     Lab Results  Component Value Date   CHOL 163 04/24/2019   HDL 76 04/24/2019   LDLCALC 55 04/24/2019   LDLDIRECT 122.3 03/06/2007   TRIG 160 (H) 04/24/2019   CHOLHDL 2.1 04/24/2019        Quit smoking / using tobacco     05/28/20 patient states she is smoking 3-4 cigarettes daily. Offered smoking cessation assistance; patient declined.  09/22/20 Unable to pursue further intervention for this care plan activity secondary to inability to maintain contact with the patient         This is a list of the screening recommended for you and due dates:  Health Maintenance  Topic Date Due   Zoster (Shingles) Vaccine (1 of 2) Never done   COVID-19 Vaccine (3 - Moderna risk series) 08/25/2020   DTaP/Tdap/Td vaccine (3 - Td or Tdap) 07/10/2023   Screening for Lung Cancer  07/26/2023   Hemoglobin A1C  03/27/2024   Yearly kidney health urinalysis for diabetes  05/01/2024   Flu Shot  05/03/2024   Eye exam for diabetics  10/03/2024   Complete foot exam   12/25/2024   Yearly kidney function blood test for diabetes  12/27/2024   Lipid (cholesterol) test  12/27/2024   Medicare Annual Wellness Visit  01/08/2025   Mammogram  05/10/2025   Colon Cancer Screening  07/20/2033   Pneumonia Vaccine  Completed   DEXA scan (bone density measurement)  Completed   Hepatitis C Screening  Completed   HPV Vaccine  Aged Out

## 2024-01-10 DIAGNOSIS — A599 Trichomoniasis, unspecified: Secondary | ICD-10-CM | POA: Insufficient documentation

## 2024-01-10 LAB — BMP8+ANION GAP
Anion Gap: 17 mmol/L (ref 10.0–18.0)
BUN/Creatinine Ratio: 25 (ref 12–28)
BUN: 50 mg/dL — ABNORMAL HIGH (ref 8–27)
CO2: 22 mmol/L (ref 20–29)
Calcium: 9.6 mg/dL (ref 8.7–10.3)
Chloride: 101 mmol/L (ref 96–106)
Creatinine, Ser: 2.01 mg/dL — ABNORMAL HIGH (ref 0.57–1.00)
Glucose: 240 mg/dL — ABNORMAL HIGH (ref 70–99)
Potassium: 4.1 mmol/L (ref 3.5–5.2)
Sodium: 140 mmol/L (ref 134–144)
eGFR: 26 mL/min/{1.73_m2} — ABNORMAL LOW (ref >59)

## 2024-01-10 NOTE — Assessment & Plan Note (Signed)
 Lab Results  Component Value Date   HGBA1C 7.8 (A) 12/26/2023   - Continue Ozempic 1 mg, weekly  - Continue Tresiba 10 units AM - Continue Farxiga 10 mg daily

## 2024-01-10 NOTE — Assessment & Plan Note (Addendum)
 BP Readings from Last 3 Encounters:  01/09/24 (!) 86/42  01/09/24 (!) 86/42  12/28/23 134/65   Patient was recently hospitalized on 12/27/2023 for symptomatic hypotension and syncope secondary to starting new blood pressure medication, lisinopril 2.5 mg.  Presently, she denies any episodes of dizziness lightheadedness chest pain or shortness of breath.  Patient reports that since she has been discharge from the hospital she has not had any acute events of syncope.  Reports today, she feels well.  Reports that she has been taking her torsemide 80 mg daily, BiDil 3 times a day (per discharge notes she was not supposed to start this medication however she has been taking it since has been discharged), Farxiga 10 mg daily, spironolactone 12.5 mg daily, bisoprolol 2.5 mg daily.  Her blood pressures are low, 86/42, she is given 2 bottles of water to hydrate. Reports that she took her BP medications this morning. Denies any symptoms.   - Advised to take half a tablet of BIDIL, three times a day  - Advised to check blood pressures daily - Follow up with cardiology sooner (as she plans on going to visit her son in May) - Will check BMET

## 2024-01-10 NOTE — Assessment & Plan Note (Signed)
 During hospitalization, her UA was positive for Trichomonas, she was discharged with Metronidazole for 7 days. Patient was offered full STI screening: HIV, RPR, G/C, B. However, she denied. Reports that she will take the antibiotics and does not need testing for it.

## 2024-01-10 NOTE — Assessment & Plan Note (Addendum)
 Follows Dr. Gala Romney, cardiology, next appointment May 2025. Echo 12/13/2023 showed EF 35-45% with normal RV function.  - Continue torsemide 80 mg bid  - Continue HALF TABLET BiDil TID - Continue Farxiga 10 mg daily. - Continue spiro 12.5 mg daily. - Continue Bisoprolol 2.5 mg daily  - Follow up with Cardiology

## 2024-01-10 NOTE — Progress Notes (Signed)
 Internal Medicine Clinic Attending  Case discussed with the resident at the time of the visit.  We reviewed the resident's history and exam and pertinent patient test results.  I agree with the assessment, diagnosis, and plan of care documented in the resident's note. Was tolerating GDMT well before addition of lisinopril and bp low today but slightly better on repeat but still a bit lower than it should be.  Will have her take half her tab of bidil and make sure renal fx handling low bp okay.  Has been taking torsemide at same dose for some time no current signs of dehydration but will consider decreasing diuretic dose based on close follow up with repeat labs and bp/volume/weight assessment.

## 2024-01-15 DIAGNOSIS — L2089 Other atopic dermatitis: Secondary | ICD-10-CM | POA: Diagnosis not present

## 2024-01-15 DIAGNOSIS — L218 Other seborrheic dermatitis: Secondary | ICD-10-CM | POA: Diagnosis not present

## 2024-01-16 ENCOUNTER — Telehealth (HOSPITAL_COMMUNITY): Payer: Self-pay | Admitting: *Deleted

## 2024-01-16 NOTE — Telephone Encounter (Signed)
 Called to confirm/remind patient of their appointment at the [facility] on 01/05/24***.   Appointment:   [x] Confirmed  [] Left mess   [] No answer/No voice mail  [] Phone not in service  Patient reminded to bring all medications and/or complete list.  Confirmed patient has transportation. Gave directions, instructed to utilize valet parking.

## 2024-01-17 ENCOUNTER — Encounter (HOSPITAL_COMMUNITY): Payer: Self-pay

## 2024-01-17 ENCOUNTER — Ambulatory Visit (HOSPITAL_COMMUNITY)
Admission: RE | Admit: 2024-01-17 | Discharge: 2024-01-17 | Disposition: A | Discharge status: Home / Self Care | Source: Ambulatory Visit | Attending: Cardiology | Admitting: Cardiology

## 2024-01-17 VITALS — BP 110/62 | HR 79 | Wt 183.0 lb

## 2024-01-17 DIAGNOSIS — Z6833 Body mass index (BMI) 33.0-33.9, adult: Secondary | ICD-10-CM | POA: Diagnosis not present

## 2024-01-17 DIAGNOSIS — J449 Chronic obstructive pulmonary disease, unspecified: Secondary | ICD-10-CM | POA: Insufficient documentation

## 2024-01-17 DIAGNOSIS — E1122 Type 2 diabetes mellitus with diabetic chronic kidney disease: Secondary | ICD-10-CM | POA: Diagnosis not present

## 2024-01-17 DIAGNOSIS — I480 Paroxysmal atrial fibrillation: Secondary | ICD-10-CM | POA: Insufficient documentation

## 2024-01-17 DIAGNOSIS — Z95810 Presence of automatic (implantable) cardiac defibrillator: Secondary | ICD-10-CM | POA: Diagnosis not present

## 2024-01-17 DIAGNOSIS — F17210 Nicotine dependence, cigarettes, uncomplicated: Secondary | ICD-10-CM | POA: Insufficient documentation

## 2024-01-17 DIAGNOSIS — I959 Hypotension, unspecified: Secondary | ICD-10-CM | POA: Insufficient documentation

## 2024-01-17 DIAGNOSIS — N184 Chronic kidney disease, stage 4 (severe): Secondary | ICD-10-CM | POA: Diagnosis not present

## 2024-01-17 DIAGNOSIS — I5022 Chronic systolic (congestive) heart failure: Secondary | ICD-10-CM | POA: Insufficient documentation

## 2024-01-17 DIAGNOSIS — I428 Other cardiomyopathies: Secondary | ICD-10-CM | POA: Insufficient documentation

## 2024-01-17 DIAGNOSIS — E669 Obesity, unspecified: Secondary | ICD-10-CM | POA: Diagnosis not present

## 2024-01-17 DIAGNOSIS — I130 Hypertensive heart and chronic kidney disease with heart failure and stage 1 through stage 4 chronic kidney disease, or unspecified chronic kidney disease: Secondary | ICD-10-CM | POA: Insufficient documentation

## 2024-01-17 DIAGNOSIS — J9610 Chronic respiratory failure, unspecified whether with hypoxia or hypercapnia: Secondary | ICD-10-CM | POA: Diagnosis not present

## 2024-01-17 DIAGNOSIS — Z79899 Other long term (current) drug therapy: Secondary | ICD-10-CM | POA: Diagnosis not present

## 2024-01-17 NOTE — Patient Instructions (Addendum)
 Good to see you today!  No change in medications  Your physician recommends that you schedule a follow-up appointment: 4 months( August) Call office in June to schedule an appointment  If you have any questions or concerns before your next appointment please send us  a message through Gisela or call our office at [phone].    TO LEAVE A MESSAGE FOR THE NURSE SELECT OPTION 2, PLEASE LEAVE A MESSAGE INCLUDING: YOUR NAME DATE OF BIRTH CALL BACK NUMBER REASON FOR CALL**this is important as we prioritize the call backs  YOU WILL RECEIVE A CALL BACK THE SAME DAY AS LONG AS YOU CALL BEFORE 4:00 PM  At the [facility], you and your health needs are our priority. As part of our continuing mission to provide you with exceptional heart care, we have created designated Provider Care Teams. These Care Teams include your primary Cardiologist (physician) and Advanced Practice Providers (APPs- Physician Assistants and Nurse Practitioners) who all work together to provide you with the care you need, when you need it.   You may see any of the following providers on your designated Care Team at your next follow up: Dr Jules Oar Dr Peder Bourdon Dr. Alwin Baars Dr. Arta Lark Amy Marijane Shoulders, NP Ruddy Corral, [place] [facility] [place], [place] Dennise Fitz, NP [place] Lee, NP Shawnee Dellen, NP Luster Salters, PharmD Bevely Brush, PharmD   Please be sure to bring in all your medications bottles to every appointment.    Thank you for choosing [facility]

## 2024-01-17 NOTE — Progress Notes (Signed)
 ADVANCED HF CLINIC NOTE   PCP: Fay Hoop, MD HF Cardiologist:  Dr. Julane Ny   Reason for Visit: [facility] F/u for Symptomatic Hypotension Necessitating Reduction of HF GDMT; F/u for Chronic Systolic Heart Failure/NICM   HPI: [name] is a 73 y.o. with a history of DM2, COPD, HTN, chronic respiratory failure, tobacco abuse, chronic systolic s/p St Jude ICD, EF has 25-30% for at least 5 years. Last LHC 2017, normal coronaries.    Admitted 9/23 with sepsis 2/2 cellulitis of RLE. Underwent TEE to r/o endocarditis. TEE (9/23) showed LVEF 30%, moderate TR, no valvular vegetation or device vegetation. Admitted a couple weeks later with hypothermia and hypoglycemia. Insulin stopped and SGLT2i continued. Entresto and spiro held due to low BP.   Admitted 10/23 with A/C HFrEF, AECOPD, and new onset A flutter RVR. Given steroinds + inhalers. Started on amio drip + heparin drip. Diuresed with IV lasix.  Hospital course complicated by AKI. Renal US  negative for hydronephrosis. Had RHC prior to discharge. Low filing pressures and CI 2.5. Discharge weight 197 pounds.   Seen in ED 03/09/23 with SOB. BNP normal, felt to be COPD exac, improved after duonebs.  Seen in clinic in 8/24. Volume overloaded. Furoscix prescribed. Diuresed well.   Echo 3/25, EF 35-40%, no significant valvular dz, RV normal   Seen by PCP on 12/26/23 and was hypertensive. Lisinopril 2.5 mg daily was added to regimen.   She presented to the ED the next day on 3/26 and was admitted for syncope. Found to be hypotensive in the ED, SBP in the 70s. Head CT/MRI and carotid dopplers unremarkable. Device interrogation showed no VT. Syncope felt 2/2 hypotension. Bidil and lisinopril were discontinued. Continued on Farxiga, Spiro, Bisoprolol and torsemide.   She presents today for post hospital f/u. She reports doing well. Feels better. BP improved, 110/62. Denies further syncope/ near syncope. No lightheadedness. Denies dyspnea and CP.  Device interrogation today shows normal impedence. Trends c/w euvolemia. No VT/VF. EKG shows NSR 77 bpm.    Since being discharged, she has had f/u labs, done at PCP office. This was just done last wk. Labs personally reviewed. SCr improved from hospitalization and nearing baseline, down from 2.4>>2.0. K 4.1.    Still smoking 3-4 cigs/day.   Cardiac Testing  - RHC (11/23):  RA = 9 RV = 55/10 PA = 44/11 (23) PCW = 9 Fick cardiac output/index = 6.1/3.2 Thermo CO/CI = 4.8/2.5 PVR = 2.3 (Fick) 2.9 (TD) Ao sat = 95% PA sat = 67%, 67% PAPi = 5.0  - Echo (10/23): EF 25-30%, severe LV dysfunction, RV low/normal, mild to moderate MR with moderate MAC, severe TR - TEE (9/23): EF 30%, moderate TR, no valvular vegetation or device vegetation.  - Echo (10/23): EF 25-30% RV low normal. RA/LA severely dilated. D Sahped septum - Echo (9/22): EF 20-25% RV normal , LA severely reduced  - Echo (2018): EF 25-30% Grade IDD - LHC 2017 Normal Coronaries.    ROS: All systems negative except as listed in HPI, PMH and Problem List.  SH:  Social History   Socioeconomic History   Marital status: Significant Other    Spouse name: Not on file   Number of children: 3   Years of education: 80   Highest education level: 11th grade  Occupational History   Occupation: disabled  Tobacco Use   Smoking status: Every Day    Current packs/day: 0.50    Average packs/day: 0.5 packs/day for 45.0 years (22.5  ttl pk-yrs)    Types: Cigarettes   Smokeless tobacco: Never   Tobacco comments:    currently smoking  10 cigs per day  Vaping Use   Vaping status: Never Used  Substance and Sexual Activity   Alcohol use: Not Currently    Alcohol/week: 3.0 standard drinks of alcohol    Types: 3 Shots of liquor per week   Drug use: No   Sexual activity: Yes    Partners: Male    Birth control/protection: Surgical  Other Topics Concern   Not on file  Social History Narrative   ** Merged History Encounter **        Married   Patient enjoys going to [employer], church and spending time with family   Social Drivers of Corporate investment banker Strain: Low Risk  (01/09/2024)   Overall Financial Resource Strain (CARDIA)    Difficulty of Paying Living Expenses: Not hard at all  Food Insecurity: No Food Insecurity (01/09/2024)   Hunger Vital Sign    Worried About Running Out of Food in the Last Year: Never true    Ran Out of Food in the Last Year: Never true  Transportation Needs: No Transportation Needs (01/09/2024)   PRAPARE - Administrator, Civil Service (Medical): No    Lack of Transportation (Non-Medical): No  Physical Activity: Inactive (01/09/2024)   Exercise Vital Sign    Days of Exercise per Week: 0 days    Minutes of Exercise per Session: 0 min  Stress: No Stress Concern Present (01/09/2024)   [employer] of Occupational Health - Occupational Stress Questionnaire    Feeling of Stress : Not at all  Social Connections: Moderately Integrated (01/09/2024)   Social Connection and Isolation Panel [NHANES]    Frequency of Communication with Friends and Family: More than three times a week    Frequency of Social Gatherings with Friends and Family: More than three times a week    Attends Religious Services: More than 4 times per year    Active Member of [employer] or Organizations: Yes    Attends Banker Meetings: More than 4 times per year    Marital Status: Widowed  Intimate Partner Violence: Not At Risk (01/09/2024)   Humiliation, Afraid, Rape, and Kick questionnaire    Fear of Current or Ex-Partner: No    Emotionally Abused: No    Physically Abused: No    Sexually Abused: No   FH:  Family History  Problem Relation Age of Onset   Stroke Mother    Seizures Father    Heart disease Father    Diabetes Sister    Asthma Maternal Aunt        aunts   Asthma Maternal Uncle        uncles   Heart disease Paternal Aunt        aunts   Heart disease Paternal Uncle        uncles    Heart disease Maternal Aunt        aunts   Heart disease Maternal Uncle        uncles   Heart disease Maternal Grandfather    Breast cancer Daughter    Breast cancer Cousin    Asthma Grandchild    Colon cancer Neg Hx    Colon polyps Neg Hx    Esophageal cancer Neg Hx    Kidney disease Neg Hx    Gallbladder disease Neg Hx    Past Medical History:  Diagnosis Date   Acute metabolic encephalopathy 07/26/2022   ATN (acute tubular necrosis) (HCC) 07/15/2014   Automatic implantable cardioverter-defibrillator in situ    Automatic implantable cardioverter-defibrillator in situ 05/04/2007   Qualifier: Diagnosis of  By: Ladene Pickle     Blood transfusion without reported diagnosis    Cardiac defibrillator in situ    Atlas II VR (SJM) implanted by Dr Carolynne Citron   CHF (congestive heart failure) (HCC)    Chronic combined systolic and diastolic heart failure (HCC)    a. EF 35-40% in past;  b. Echo 7/13:  EF 45-50%, Gr 2 diast dysfn, mild AI, mild MAC, trivial MR, mild LAE, PASP 47.   Chronic ulcer of leg (HCC)    04-09-15 resolved-not a problem.   Colon polyps 04/12/2013   Rectosigmoid polyp   COPD (chronic obstructive pulmonary disease) (HCC)    O2 at night   Depression    Diabetes mellitus    Diabetes mellitus, type 2 (HCC) 04/03/2012   HIGH RISK FEET.. Please have patient take shoes and socks off every visit for visual foot inspection.     Eczema    Elevated alkaline phosphatase level    GGT and 5'nucleotidase 8/13 normal   Health care maintenance 01/22/2013   Surgically absent cervix- no pap needed (Path report 07/2000: uterine body with attached bilateral  adnexa and separate cervix.)   History of oxygen administration    oxygen @ 2 l/m nasally bedtime 24/7   HTN (hypertension)    Hx of cardiac cath    a. LHC 2003 normal;  b. LHC 6/13:  Mild calcification in the LM, o/w normal coronary arteries, EF 45%.    Hyperkalemia 08/08/2017   Hyperlipidemia    Elevated triglyceride in  2019   Hyperlipidemia associated with type 2 diabetes mellitus (HCC) 05/25/2007   Qualifier: Diagnosis of  By: Gaylyn Keas FNP, Nykedtra     Hyperthyroidism, subclinical    HYPERTHYROIDISM, SUBCLINICAL 05/06/2009   Qualifier: Diagnosis of  By: Amalia Badder     Implantable cardioverter-defibrillator (ICD) generator end of life    Lipoma    Need for hepatitis C screening test 04/25/2019   NICM (nonischemic cardiomyopathy) (HCC)    Obesity    On home oxygen therapy    "2L; 24/7" (10/11/2016)   Post menopausal syndrome    Shortness of breath 07/14/2017   Skin rash 07/12/2018   Sleep apnea    pt denies 04/12/2013   Current Outpatient Medications  Medication Sig Dispense Refill   Accu-Chek Softclix Lancets lancets Check BG as directed. 100 each 3   albuterol (VENTOLIN HFA) 108 (90 Base) MCG/ACT inhaler Inhale 2 puffs into the lungs every 6 (six) hours as needed for wheezing or shortness of breath. 8 g 2   amiodarone (PACERONE) 200 MG tablet Take 0.5 tablets (100 mg total) by mouth daily. 45 tablet 2   ANORO ELLIPTA 62.5-25 MCG/ACT AEPB INHALE 1 PUFF INTO THE LUNGS DAILY 60 each 2   apixaban (ELIQUIS) 5 MG TABS tablet Take 1 tablet (5 mg total) by mouth 2 (two) times daily. 60 tablet 2   aspirin EC 81 MG tablet Take 81 mg by mouth daily. Swallow whole.     atorvastatin (LIPITOR) 80 MG tablet Take 80 mg by mouth at bedtime.     bisoprolol (ZEBETA) 5 MG tablet Take 0.5 tablets (2.5 mg total) by mouth daily. 15 tablet 11   calcium-vitamin D (OSCAL WITH D) 500-200 MG-UNIT tablet Take 1 tablet by mouth 2 (two)  times daily. 90 tablet 6   Continuous Glucose Receiver (DEXCOM G7 RECEIVER) DEVI Place new sensor every 10 days, use to monitor blood sugar continuously 1 each 0   Continuous Glucose Sensor (DEXCOM G7 SENSOR) MISC Place new sensor every 10 days. Use to monitor blood sugar continuously. 9 each 3   dapagliflozin propanediol (FARXIGA) 10 MG TABS tablet Take 1 tablet (10 mg total) by mouth daily. 90  tablet 1   Insulin Degludec (TRESIBA [place]) Inject 10 Units into the skin daily.     Insulin Pen Needle (PEN NEEDLES) 32G X 4 MM MISC 1 Needle by Does not apply route daily. 100 each 1   ketoconazole (NIZORAL) 2 % shampoo Apply 1 Application topically once a week.     Lancets Misc. (ACCU-CHEK SOFTCLIX LANCET DEV) KIT Check BG as directed. 1 kit 1   levocetirizine (XYZAL) 5 MG tablet Take 5 mg by mouth at bedtime.     OXYGEN Inhale 2 L into the lungs at bedtime.     OZEMPIC, 1 MG/DOSE, 4 MG/3ML SOPN Inject 1 mg into the skin once a week. Takes on Saturdays     potassium chloride SA (KLOR-CON M) 20 MEQ tablet Take 20 mEq by mouth 2 (two) times daily.     spironolactone (ALDACTONE) 25 MG tablet Take 0.5 tablets (12.5 mg total) by mouth daily. 45 tablet 3   torsemide (DEMADEX) 20 MG tablet Take 80 mg by mouth 2 (two) times daily.     triamcinolone ointment (KENALOG) 0.1 % Apply 1 Application topically 2 (two) times daily. Mix with Eucerin cream     No current facility-administered medications for this encounter.   BP 110/62   Pulse 79   Wt 83 kg (183 lb)   SpO2 94%   BMI 33.47 kg/m   Wt Readings from Last 3 Encounters:  01/17/24 83 kg (183 lb)  01/09/24 83.4 kg (183 lb 12.8 oz)  01/09/24 83.4 kg (183 lb 12.8 oz)   PHYSICAL EXAM: General:  Well appearing, elderly. No respiratory difficulty HEENT: normal Neck: supple. no JVD. Carotids 2+ bilat; no bruits. No lymphadenopathy or thyromegaly appreciated. Cor: PMI nondisplaced. Regular rate & rhythm. No rubs, gallops or murmurs. Lungs: clear Abdomen: soft, nontender, nondistended. No hepatosplenomegaly. No bruits or masses. Good bowel sounds. Extremities: no cyanosis, clubbing, rash, edema Neuro: alert & oriented x 3, cranial nerves grossly intact. moves all 4 extremities w/o difficulty. Affect pleasant.  Device interrogation: stable thoracic impedence c/w euvolemia, no VT/VF, personally reviewed   ASSESSMENT & PLAN:  1. Symptomatic  Hypotension - had syncope 2/2 low BP after initiation of lisinopril, SBPs 70s, Head CT/MRI and carotid dopplers unremarkable. ICD interrogation reportedly showed no VT - Bidil and lisinopril discontinued - resolved. No further syncope/near syncope. BP stable 110/62 today   2. Chronic HFrEF  - NICM LHC 2017 normal cors. - Echo (2022):EF 25-30% which has been down for many years.  - Echo (10/23): EF 25-30%, D shaped LV, RV mildly dilated w/ mod elevated RVSP, RV systolic fx mildly reduced, severe BAE, severe TR, mild-mod MR, IVC dilated, assumed RAP 15  - RHC (11/23): well-compensated filling pressures, normal CO and mild PH. - Echo 3/25 EF 35-40%, RV nl  - s/p STJ ICD - Stable NYHA Class II-III. Euvolemic on exam and device interrogation - Continue Farxiga 10 mg daily  - Continue Spiro 12.5 mg daily  - Continue bisoprolol 2.5 mg daily  - off Bidil due to hypotension. - failed ACE-I (lisinopril) due  to hypotension  - continue torsemide 80 mg bid  - BMP done in PCP office last week (post hospital), SCr and K both stable/WNL   2. COPD/Tobacco Abuse - Still smoking 3-4 cigs/day  - Discussed need for cessation - Says she wears her O2 at home - Does not want to be referred back to pulmonologist, states she would not go  3. PAF - Continue low dose amio 100 mg daily. EKG today shows NSR, HR 77 bpm    - Recent amio labs (TSH and HFTs) were normal 3/25  - Continue Eliquis 5 mg bid  - She says she will not wear CPAP, so will not pursue sleep study.    4. CKD IV - Renal u/s (10/23) no hydronephrosis - Baseline SCr 2-3 but more recently 1.9-2.1, stable on last wks labs (personally reviewed)  - Continue Farxiga     5.  DMII - A1c 9.2 (10/24) - On SGLT2i + statin. - Management per PCP.  6. Obesity - Body mass index is 33.47 kg/m. - She is on Ozempic. - Managed by PCP   7. HTN - recent hypotension - BP now stable w/ med reduction  - continue HF GDMT regimen per above  - keep of  lisinopril and Bidil   F/u w/ Dr. Bensimhon in 4-6 months   Ruddy Corral, PA-C  11:03 AM

## 2024-01-22 ENCOUNTER — Ambulatory Visit (INDEPENDENT_AMBULATORY_CARE_PROVIDER_SITE_OTHER): Admitting: Student

## 2024-01-22 VITALS — BP 129/67 | HR 79 | Temp 98.1°F | Ht 62.0 in | Wt 185.3 lb

## 2024-01-22 DIAGNOSIS — I5020 Unspecified systolic (congestive) heart failure: Secondary | ICD-10-CM | POA: Diagnosis not present

## 2024-01-22 DIAGNOSIS — I110 Hypertensive heart disease with heart failure: Secondary | ICD-10-CM

## 2024-01-22 DIAGNOSIS — I10 Essential (primary) hypertension: Secondary | ICD-10-CM

## 2024-01-22 NOTE — Patient Instructions (Signed)
 Thank you, Ms.Loise Rinks for allowing us  to provide your care today. Today we discussed:  STOP BIDIL .   I have ordered the following labs for you:   Lab Orders         BMP8+Anion Gap      Tests ordered today:  BMP  Referrals ordered today:   Referral Orders  No referral(s) requested today     I have ordered the following medication/changed the following medications:   Stop the following medications: There are no discontinued medications.   Start the following medications: No orders of the defined types were placed in this encounter.    Follow up: 2 months HTN   Remember:   Should you have any questions or concerns please call the internal medicine clinic at [phone].     Lanney Pitts, DO [facility]

## 2024-01-22 NOTE — Assessment & Plan Note (Signed)
 BP Readings from Last 3 Encounters:  01/22/24 129/67  01/17/24 110/62  01/09/24 (!) 86/42   Recently hospitalized for symptomatic hypotension 2/2 starting Lisnopril 2.5 mg with addition to antihypertensive + GDMT medications. She was seen on 01/09/2024 with BP 86/42, off ACEi, but taking BiDIl . She has followed up with Cardiology, was supposed to stop taking BiDIL  however she is taking half a tablet. BMP checked 4/8 showed increase in Scr 2.01 from 1.88. Blood pressure today at goal.  - Recheck BMP today  - Continue checking BP at home  - Stop BiDil  completely  - Will follow up in 2 months

## 2024-01-22 NOTE — Progress Notes (Signed)
 Established Patient Office Visit  Subjective   Patient ID: [name], female    DOB: August 08, 1951  Age: 73 y.o. MRN: [id number]  Chief Complaint  Patient presents with   Follow-up    Patient states here for bp and lab    HPI This is a 73 year old female living with a history stated below and presents today for BP check and repeat BMP. Please see problem based assessment and plan for additional details.  Past Medical History:  Diagnosis Date   Acute metabolic encephalopathy 07/26/2022   ATN (acute tubular necrosis) (HCC) 07/15/2014   Automatic implantable cardioverter-defibrillator in situ    Automatic implantable cardioverter-defibrillator in situ 05/04/2007   Qualifier: Diagnosis of  By: Ladene Pickle     Blood transfusion without reported diagnosis    Cardiac defibrillator in situ    Atlas II VR (SJM) implanted by Dr Carolynne Citron   CHF (congestive heart failure) (HCC)    Chronic combined systolic and diastolic heart failure (HCC)    a. EF 35-40% in past;  b. Echo 7/13:  EF 45-50%, Gr 2 diast dysfn, mild AI, mild MAC, trivial MR, mild LAE, PASP 47.   Chronic ulcer of leg (HCC)    04-09-15 resolved-not a problem.   Colon polyps 04/12/2013   Rectosigmoid polyp   COPD (chronic obstructive pulmonary disease) (HCC)    O2 at night   Depression    Diabetes mellitus    Diabetes mellitus, type 2 (HCC) 04/03/2012   HIGH RISK FEET.. Please have patient take shoes and socks off every visit for visual foot inspection.     Eczema    Elevated alkaline phosphatase level    GGT and 5'nucleotidase 8/13 normal   Health care maintenance 01/22/2013   Surgically absent cervix- no pap needed (Path report 07/2000: uterine body with attached bilateral  adnexa and separate cervix.)   History of oxygen  administration    oxygen  @ 2 l/m nasally bedtime 24/7   HTN (hypertension)    Hx of cardiac cath    a. LHC 2003 normal;  b. LHC 6/13:  Mild calcification in the LM, o/w normal coronary arteries, EF  45%.    Hyperkalemia 08/08/2017   Hyperlipidemia    Elevated triglyceride in 2019   Hyperlipidemia associated with type 2 diabetes mellitus (HCC) 05/25/2007   Qualifier: Diagnosis of  By: Gaylyn Keas FNP, Nykedtra     Hyperthyroidism, subclinical    HYPERTHYROIDISM, SUBCLINICAL 05/06/2009   Qualifier: Diagnosis of  By: Amalia Badder     Implantable cardioverter-defibrillator (ICD) generator end of life    Lipoma    Need for hepatitis C screening test 04/25/2019   NICM (nonischemic cardiomyopathy) (HCC)    Obesity    On home oxygen  therapy    "2L; 24/7" (10/11/2016)   Post menopausal syndrome    Shortness of breath 07/14/2017   Skin rash 07/12/2018   Sleep apnea    pt denies 04/12/2013    ROS   As per assessment and Plan  Objective:     BP 129/67 (BP Location: Left Arm, Patient Position: Sitting, Cuff Size: Large)   Pulse 79   Temp 98.1 F (36.7 C) (Oral)   Ht 5\' 2"  (1.575 m)   Wt 185 lb 4.8 oz (84.1 kg)   SpO2 94%   BMI 33.89 kg/m  BP Readings from Last 3 Encounters:  01/22/24 129/67  01/17/24 110/62  01/09/24 (!) 86/42   Wt Readings from Last 3 Encounters:  01/22/24 185 lb  4.8 oz (84.1 kg)  01/17/24 183 lb (83 kg)  01/09/24 183 lb 12.8 oz (83.4 kg)      Physical Exam  General: Sitting in chair, no acute distress Cardiovascular: Regular rate, no murmurs appreciated Pulmonary: Breathing comfortably, no wheezing or crackles Abdomen: Soft, nontender, nondistended, bowel sounds present MSK: Range of motion intact, no lower extremity edema  No results found for any visits on 01/22/24.  Last metabolic panel Lab Results  Component Value Date   GLUCOSE 240 (H) 01/09/2024   NA 140 01/09/2024   K 4.1 01/09/2024   CL 101 01/09/2024   CO2 22 01/09/2024   BUN 50 (H) 01/09/2024   CREATININE 2.01 (H) 01/09/2024   EGFR 26 (L) 01/09/2024   CALCIUM  9.6 01/09/2024   PHOS 4.5 06/16/2021   PROT 6.3 (L) 12/27/2023   ALBUMIN  3.4 (L) 12/27/2023   LABGLOB 3.2 06/27/2022    AGRATIO 0.7 06/27/2022   BILITOT 0.5 12/27/2023   ALKPHOS 105 12/27/2023   AST 15 12/27/2023   ALT 12 12/27/2023   ANIONGAP 11 12/28/2023      The ASCVD Risk score (Arnett DK, et al., 2019) failed to calculate for the following reasons:   Risk score cannot be calculated because patient has a medical history suggesting prior/existing ASCVD    Assessment & Plan:  Patient is discussed with Dr Broadus Canes   Problem List Items Addressed This Visit       Cardiovascular and Mediastinum   Essential hypertension - Primary (Chronic)   BP Readings from Last 3 Encounters:  01/22/24 129/67  01/17/24 110/62  01/09/24 (!) 86/42   Recently hospitalized for symptomatic hypotension 2/2 starting Lisnopril 2.5 mg with addition to antihypertensive + GDMT medications. She was seen on 01/09/2024 with BP 86/42, off ACEi, but taking BiDIl . She has followed up with Cardiology, was supposed to stop taking BiDIL  however she is taking half a tablet. BMP checked 4/8 showed increase in Scr 2.01 from 1.88. Blood pressure today at goal.  - Recheck BMP today  - Continue checking BP at home  - Stop BiDil  completely  - Will follow up in 2 months       Relevant Orders   BMP8+Anion Gap   Heart failure with reduced ejection fraction (HFrEF, <= 40%) (HCC)   Follows cardiology, last OV 01/17/2024; GDMT Farxiga  10, Spiro 12.5 mg, bisoprolol  2.5 mg daily. She is also taking torsemide  80 mg BID. Patient is advised to stop BiDil .        Return in about 2 months (around 03/23/2024) for Blood pressure .    Lanney Pitts, DO

## 2024-01-22 NOTE — Assessment & Plan Note (Signed)
 Follows cardiology, last OV 01/17/2024; GDMT Farxiga  10, Spiro 12.5 mg, bisoprolol  2.5 mg daily. She is also taking torsemide  80 mg BID. Patient is advised to stop BiDil .

## 2024-01-23 LAB — BMP8+ANION GAP
Anion Gap: 15 mmol/L (ref 10.0–18.0)
BUN/Creatinine Ratio: 15 (ref 12–28)
BUN: 31 mg/dL — ABNORMAL HIGH (ref 8–27)
CO2: 24 mmol/L (ref 20–29)
Calcium: 9.4 mg/dL (ref 8.7–10.3)
Chloride: 103 mmol/L (ref 96–106)
Creatinine, Ser: 2.13 mg/dL — ABNORMAL HIGH (ref 0.57–1.00)
Glucose: 203 mg/dL — ABNORMAL HIGH (ref 70–99)
Potassium: 4.3 mmol/L (ref 3.5–5.2)
Sodium: 142 mmol/L (ref 134–144)
eGFR: 24 mL/min/{1.73_m2} — ABNORMAL LOW (ref >59)

## 2024-01-28 DIAGNOSIS — J449 Chronic obstructive pulmonary disease, unspecified: Secondary | ICD-10-CM | POA: Diagnosis not present

## 2024-01-28 DIAGNOSIS — I509 Heart failure, unspecified: Secondary | ICD-10-CM | POA: Diagnosis not present

## 2024-01-31 ENCOUNTER — Encounter: Payer: Self-pay | Admitting: Internal Medicine

## 2024-01-31 NOTE — Progress Notes (Signed)
 Internal Medicine Clinic Attending  Case discussed with the resident at the time of the visit.  We reviewed the resident's history and exam and pertinent patient test results.  I agree with the assessment, diagnosis, and plan of care documented in the resident's note.  BP has improved but not cr; this may reflect more of the diuretic component of her GDMT.

## 2024-02-05 ENCOUNTER — Encounter (HOSPITAL_COMMUNITY): Admitting: Internal Medicine

## 2024-02-08 NOTE — Progress Notes (Signed)
 Internal Medicine Clinic Attending  I reviewed the AWV findings of the medical professional who conducted the visit. I was present in the office suite and immediately available to provide assistance and direction throughout the time the service was provided.

## 2024-02-27 ENCOUNTER — Encounter: Admitting: Student

## 2024-02-27 DIAGNOSIS — J449 Chronic obstructive pulmonary disease, unspecified: Secondary | ICD-10-CM | POA: Diagnosis not present

## 2024-02-27 DIAGNOSIS — I509 Heart failure, unspecified: Secondary | ICD-10-CM | POA: Diagnosis not present

## 2024-03-04 ENCOUNTER — Ambulatory Visit: Payer: Self-pay | Admitting: Student

## 2024-03-05 ENCOUNTER — Encounter (HOSPITAL_COMMUNITY): Payer: Self-pay | Admitting: Emergency Medicine

## 2024-03-05 ENCOUNTER — Ambulatory Visit (HOSPITAL_COMMUNITY)
Admission: EM | Admit: 2024-03-05 | Discharge: 2024-03-05 | Disposition: A | Discharge status: Home / Self Care | Attending: Physician Assistant | Admitting: Physician Assistant

## 2024-03-05 DIAGNOSIS — E1165 Type 2 diabetes mellitus with hyperglycemia: Secondary | ICD-10-CM

## 2024-03-05 DIAGNOSIS — N184 Chronic kidney disease, stage 4 (severe): Secondary | ICD-10-CM | POA: Diagnosis not present

## 2024-03-05 DIAGNOSIS — M25571 Pain in right ankle and joints of right foot: Secondary | ICD-10-CM | POA: Diagnosis not present

## 2024-03-05 DIAGNOSIS — M25471 Effusion, right ankle: Secondary | ICD-10-CM

## 2024-03-05 MED ORDER — DICLOFENAC SODIUM 25 MG PO TBEC: 25.0000 mg | DELAYED_RELEASE_TABLET | Freq: Two times a day (BID) | ORAL | 0 refills | Status: AC

## 2024-03-05 MED ORDER — DOXYCYCLINE HYCLATE 100 MG PO CAPS: 100.0000 mg | ORAL_CAPSULE | Freq: Two times a day (BID) | ORAL | 0 refills | Status: DC

## 2024-03-05 NOTE — ED Triage Notes (Signed)
 Pt reports right ankle swelling and pain started getting worse last week. Denies any injury. Tried icy hot this morning and staying off foot.

## 2024-03-05 NOTE — Discharge Instructions (Signed)
   Follow up with PCP if no gradual improvement.   Report to ED with any worsening.

## 2024-03-05 NOTE — ED Provider Notes (Signed)
 [facility]    CSN: [id number] Arrival date & time: 03/05/24  1425      History   Chief Complaint Chief Complaint  Patient presents with   Ankle Pain    HPI [name] is a 73 y.o. female.   Patient presents today for evaluation of right ankle swelling and pain that started last week.  She reports that symptoms have gradually worsened.  She has not had any injury.  She has tried IcyHot this morning as well as staying off of foot and ankle without significant improvement.  She denies any fever.  She reports some very mild lower leg pain but ankle is most painful.  She states that ankle pain and swelling started on both sides of ankle.  She denies any shortness of breath.  Patient does have known diabetes as well as kidney disease and CHF.    The history is provided by the patient.  Ankle Pain Associated symptoms: no fever     Past Medical History:  Diagnosis Date   Acute metabolic encephalopathy 07/26/2022   ATN (acute tubular necrosis) (HCC) 07/15/2014   Automatic implantable cardioverter-defibrillator in situ    Automatic implantable cardioverter-defibrillator in situ 05/04/2007   Qualifier: Diagnosis of  By: Ladene Pickle     Blood transfusion without reported diagnosis    Cardiac defibrillator in situ    Atlas II VR (SJM) implanted by Dr Carolynne Citron   CHF (congestive heart failure) (HCC)    Chronic combined systolic and diastolic heart failure (HCC)    a. EF 35-40% in past;  b. Echo 7/13:  EF 45-50%, Gr 2 diast dysfn, mild AI, mild MAC, trivial MR, mild LAE, PASP 47.   Chronic ulcer of leg (HCC)    04-09-15 resolved-not a problem.   Colon polyps 04/12/2013   Rectosigmoid polyp   COPD (chronic obstructive pulmonary disease) (HCC)    O2 at night   Depression    Diabetes mellitus    Diabetes mellitus, type 2 (HCC) 04/03/2012   HIGH RISK FEET.. Please have patient take shoes and socks off every visit for visual foot inspection.     Eczema    Elevated alkaline  phosphatase level    GGT and 5'nucleotidase 8/13 normal   Health care maintenance 01/22/2013   Surgically absent cervix- no pap needed (Path report 07/2000: uterine body with attached bilateral  adnexa and separate cervix.)   History of oxygen  administration    oxygen  @ 2 l/m nasally bedtime 24/7   HTN (hypertension)    Hx of cardiac cath    a. LHC 2003 normal;  b. LHC 6/13:  Mild calcification in the LM, o/w normal coronary arteries, EF 45%.    Hyperkalemia 08/08/2017   Hyperlipidemia    Elevated triglyceride in 2019   Hyperlipidemia associated with type 2 diabetes mellitus (HCC) 05/25/2007   Qualifier: Diagnosis of  By: Gaylyn Keas FNP, [place]     Hyperthyroidism, subclinical    HYPERTHYROIDISM, SUBCLINICAL 05/06/2009   Qualifier: Diagnosis of  By: Amalia Badder     Implantable cardioverter-defibrillator (ICD) generator end of life    Lipoma    Need for hepatitis C screening test 04/25/2019   NICM (nonischemic cardiomyopathy) (HCC)    Obesity    On home oxygen  therapy    "2L; 24/7" (10/11/2016)   Post menopausal syndrome    Shortness of breath 07/14/2017   Skin rash 07/12/2018   Sleep apnea    pt denies 04/12/2013    Patient Active Problem  List   Diagnosis Date Noted   Trichomonas infection 01/10/2024   Drug-induced hypotension 12/28/2023   Small vessel disease, cerebrovascular 12/28/2023   Syncope 12/27/2023   Stage 4 chronic kidney disease (HCC) 03/02/2023   Shortness of breath 03/01/2023   Esophageal dysphagia 10/10/2022   Preventative health care 08/18/2022   CKD (chronic kidney disease) stage 4, GFR 15-29 ml/min (HCC) 07/26/2022   COPD (chronic obstructive pulmonary disease) (HCC) 07/26/2022   Obesity (BMI 30-39.9) 07/26/2022   Paroxysmal atrial flutter (HCC) 07/25/2022   Multiple thyroid  nodules 07/04/2022   Recurrent falls 07/04/2022   Hepatic steatosis 07/28/2020   Hyperlipidemia    Osteoporosis 02/12/2020   Intrinsic atopic dermatitis 09/19/2019   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   Heart failure with reduced ejection fraction (HFrEF, <= 40%) (HCC) 03/18/2016   Spinal stenosis of lumbar region 12/09/2015   Restrictive lung disease 06/24/2015   Diabetic gastroparesis associated with type 2 diabetes mellitus (HCC) 07/12/2014   Depression 05/22/2014   Uncontrolled type 2 diabetes mellitus with hyperglycemia (HCC) 04/03/2012   Tobacco use disorder 10/26/2009   Essential hypertension 05/25/2007   ICD (implantable cardioverter-defibrillator) in place 05/04/2007    Past Surgical History:  Procedure Laterality Date   ABDOMINAL HYSTERECTOMY     CARDIAC CATHETERIZATION     CARDIAC CATHETERIZATION N/A 06/21/2016   Procedure: Left Heart Cath and Coronary Angiography;  Surgeon: Arnoldo Lapping, MD;  Location: [facility] INVASIVE CV LAB;  Service: Cardiovascular;  Laterality: N/A;   CARDIAC DEFIBRILLATOR PLACEMENT  05/04/2007   SJM Atlas II VR ICD   CARDIAC DEFIBRILLATOR PLACEMENT     CATARACT EXTRACTION     od   COLONOSCOPY N/A 04/12/2013   Procedure: COLONOSCOPY;  Surgeon: Almeda Aris, MD;  Location: [facility] ENDOSCOPY;  Service: Endoscopy;  Laterality: N/A;  pt.has defibrilator   COLONOSCOPY N/A 04/16/2015   Procedure: COLONOSCOPY;  Surgeon: Janel Medford, MD;  Location: [facility] ENDOSCOPY;  Service: Endoscopy;  Laterality: N/A;   COLONOSCOPY WITH PROPOFOL  N/A 07/21/2023   Procedure: COLONOSCOPY WITH PROPOFOL ;  Surgeon: Alvis Jourdain, MD;  Location: [facility]OS[facility];  Service: Gastroenterology;  Laterality: N/A;   ESOPHAGOGASTRODUODENOSCOPY N/A 04/16/2015   Procedure: ESOPHAGOGASTRODUODENOSCOPY (EGD);  Surgeon: Janel Medford, MD;  Location: [facility];  Service: Endoscopy;  Laterality: N/A;   ESOPHAGOGASTRODUODENOSCOPY (EGD) WITH PROPOFOL  N/A 11/25/2022   Procedure: ESOPHAGOGASTRODUODENOSCOPY (EGD) WITH PROPOFOL ;  Surgeon: Alvis Jourdain, MD;  Location: [facility];  Service: Gastroenterology;  Laterality: N/A;   EYE SURGERY     HERNIA REPAIR      HYSTEROSCOPY     IMPLANTABLE CARDIOVERTER DEFIBRILLATOR (ICD) GENERATOR CHANGE N/A 04/02/2014   Procedure: ICD GENERATOR CHANGE;  Surgeon: Tammie Fall, MD;  Location: [facility] CATH LAB;  Service: Cardiovascular;  Laterality: N/A;   INSERT / REPLACE / REMOVE PACEMAKER     LEFT HEART CATHETERIZATION WITH CORONARY ANGIOGRAM N/A 04/02/2012   Procedure: LEFT HEART CATHETERIZATION WITH CORONARY ANGIOGRAM;  Surgeon: Kristopher Pheasant, MD;  Location: [facility] CATH LAB;  Service: Cardiovascular;  Laterality: N/A;   ORIF ANKLE FRACTURE Right 08/12/2019   Procedure: OPEN REDUCTION INTERNAL FIXATION (ORIF) RIGHT TRIMALLEOLAR ANKLE FRACTURE;  Surgeon: Wes Hamman, MD;  Location: [facility] OR;  Service: Orthopedics;  Laterality: Right;   RIGHT HEART CATH N/A 08/03/2022   Procedure: RIGHT HEART CATH;  Surgeon: Mardell Shade, MD;  Location: [facility] INVASIVE CV LAB;  Service: Cardiovascular;  Laterality: N/A;   SAVORY DILATION N/A 11/25/2022   Procedure: SAVORY DILATION;  Surgeon: Alvis Jourdain, MD;  Location:  [facility] ENDOSCOPY;  Service: Gastroenterology;  Laterality: N/A;   TEE WITHOUT CARDIOVERSION N/A 06/23/2022   Procedure: TRANSESOPHAGEAL ECHOCARDIOGRAM (TEE);  Surgeon: Euell Herrlich, MD;  Location: [facility] ENDOSCOPY;  Service: Cardiology;  Laterality: N/A;   TONSILLECTOMY     TUBAL LIGATION      OB History   No obstetric history on file.      Home Medications    Prior to Admission medications   Medication Sig Start Date End Date Taking? Authorizing Provider  diclofenac  (VOLTAREN ) 25 MG EC tablet Take 1 tablet (25 mg total) by mouth 2 (two) times daily for 7 days. 03/05/24 03/12/24 Yes Vernestine Gondola, PA-C  doxycycline  (VIBRAMYCIN ) 100 MG capsule Take 1 capsule (100 mg total) by mouth 2 (two) times daily. 03/05/24  Yes Vernestine Gondola, PA-C  Accu-Chek Softclix Lancets lancets Check BG as directed. 11/03/22   Mapp, Tavien, MD  albuterol  (VENTOLIN  HFA) 108 (90 Base) MCG/ACT inhaler Inhale 2 puffs into the lungs every 6 (six)  hours as needed for wheezing or shortness of breath. 07/20/23   Amoako, Prince, MD  amiodarone  (PACERONE ) 200 MG tablet Take 0.5 tablets (100 mg total) by mouth daily. 03/03/23 01/16/25  Bensimhon, Rheta Celestine, MD  ANORO ELLIPTA  62.5-25 MCG/ACT AEPB INHALE 1 PUFF INTO THE LUNGS DAILY 01/08/24   Amoako, Prince, MD  apixaban  (ELIQUIS ) 5 MG TABS tablet Take 1 tablet (5 mg total) by mouth 2 (two) times daily. 11/10/23 02/08/24  Bensimhon, Rheta Celestine, MD  aspirin  EC 81 MG tablet Take 81 mg by mouth daily. Swallow whole.    [provider]  atorvastatin  (LIPITOR ) 80 MG tablet Take 80 mg by mouth at bedtime.    [provider]  bisoprolol  (ZEBETA ) 5 MG tablet Take 0.5 tablets (2.5 mg total) by mouth daily. 08/30/23   Sheryl Xiana, NP  calcium -vitamin D  (OSCAL WITH D) 500-200 MG-UNIT tablet Take 1 tablet by mouth 2 (two) times daily. 02/12/20   Twanda Gala, MD  Continuous Glucose Receiver (DEXCOM G7 RECEIVER) DEVI Place new sensor every 10 days, use to monitor blood sugar continuously 06/26/23   Tawkaliyar, Roya, DO  Continuous Glucose Sensor (DEXCOM G7 SENSOR) MISC Place new sensor every 10 days. Use to monitor blood sugar continuously. 06/26/23   Tawkaliyar, Roya, DO  dapagliflozin  propanediol (FARXIGA ) 10 MG TABS tablet Take 1 tablet (10 mg total) by mouth daily. 08/29/23   Fay Hoop, MD  Insulin  Degludec (TRESIBA  Lexa) Inject 10 Units into the skin daily.    [provider]  Insulin  Pen Needle (PEN NEEDLES) 32G X 4 MM MISC 1 Needle by Does not apply route daily. 12/09/22   Nguyen, Quan, DO  ketoconazole  (NIZORAL ) 2 % shampoo Apply 1 Application topically once a week. 10/16/23   [provider]  Lancets Misc. (ACCU-CHEK SOFTCLIX LANCET DEV) KIT Check BG as directed. 11/03/22   Mapp, Tavien, MD  levocetirizine (XYZAL) 5 MG tablet Take 5 mg by mouth at bedtime. 02/21/23   [provider]  OXYGEN  Inhale 2 L into the lungs at bedtime.    [provider]  OZEMPIC , 1  MG/DOSE, 4 MG/3ML SOPN Inject 1 mg into the skin once a week. Takes on Saturdays 12/22/23   [provider]  potassium chloride  SA (KLOR-CON  M) 20 MEQ tablet Take 20 mEq by mouth 2 (two) times daily.    [provider]  spironolactone  (ALDACTONE ) 25 MG tablet Take 0.5 tablets (12.5 mg total) by mouth daily. 06/14/23   Bensimhon, Bearl Limes  R, MD  torsemide  (DEMADEX ) 20 MG tablet Take 80 mg by mouth 2 (two) times daily. 03/06/23   [provider]  triamcinolone  ointment (KENALOG ) 0.1 % Apply 1 Application topically 2 (two) times daily. Mix with Eucerin cream 02/20/23   [provider]    Family History Family History  Problem Relation Age of Onset   Stroke Mother    Seizures Father    Heart disease Father    Diabetes Sister    Asthma Maternal Aunt        aunts   Asthma Maternal Uncle        uncles   Heart disease Paternal Aunt        aunts   Heart disease Paternal Uncle        uncles   Heart disease Maternal Aunt        aunts   Heart disease Maternal Uncle        uncles   Heart disease Maternal Grandfather    Breast cancer Daughter    Breast cancer Cousin    Asthma Grandchild    Colon cancer Neg Hx    Colon polyps Neg Hx    Esophageal cancer Neg Hx    Kidney disease Neg Hx    Gallbladder disease Neg Hx     Social History Social History   Tobacco Use   Smoking status: Every Day    Current packs/day: 0.50    Average packs/day: 0.5 packs/day for 45.0 years (22.5 ttl pk-yrs)    Types: Cigarettes   Smokeless tobacco: Never   Tobacco comments:    currently smoking  10 cigs per day  Vaping Use   Vaping status: Never Used  Substance Use Topics   Alcohol use: Not Currently    Alcohol/week: 3.0 standard drinks of alcohol    Types: 3 Shots of liquor per week   Drug use: No     Allergies   Actos [pioglitazone], Naproxen , Rosiglitazone, Metformin  and related, Tomato, Hydrocodone -acetaminophen , and Hydrocortisone   Review of Systems Review of  Systems  Constitutional:  Negative for chills and fever.  Eyes:  Negative for discharge and redness.  Respiratory:  Negative for shortness of breath.   Gastrointestinal:  Negative for abdominal pain, nausea and vomiting.  Musculoskeletal:  Positive for arthralgias and joint swelling.  Neurological:  Negative for numbness.     Physical Exam Triage Vital Signs ED Triage Vitals  Encounter Vitals Group     BP 03/05/24 1535 125/81     Systolic BP Percentile --      Diastolic BP Percentile --      Pulse Rate 03/05/24 1535 100     Resp 03/05/24 1535 18     Temp 03/05/24 1535 (!) 97.5 F (36.4 C)     Temp Source 03/05/24 1535 Oral     SpO2 03/05/24 1535 93 %     Weight --      Height --      Head Circumference --      Peak Flow --      Pain Score 03/05/24 1534 8     Pain Loc --      Pain Education --      Exclude from Growth Chart --    No data found.  Updated Vital Signs BP 125/81 (BP Location: Left Arm)   Pulse 100   Temp (!) 97.5 F (36.4 C) (Oral)   Resp 18   SpO2 93%   Visual Acuity Right Eye Distance:  Left Eye Distance:   Bilateral Distance:    Right Eye Near:   Left Eye Near:    Bilateral Near:     Physical Exam Vitals and nursing note reviewed.  Constitutional:      General: She is not in acute distress.    Appearance: Normal appearance. She is not ill-appearing.  HENT:     Head: Normocephalic and atraumatic.  Eyes:     Conjunctiva/sclera: Conjunctivae normal.  Cardiovascular:     Rate and Rhythm: Normal rate.  Pulmonary:     Effort: Pulmonary effort is normal. No respiratory distress.  Musculoskeletal:     Comments: Diffuse swelling with mild erythema appreciated to right ankle with TTP throughout and decreased ROM due to pain, minimal TTP to right calf without apparent swelling  Skin:    Capillary Refill: Normal cap refill to right toes Neurological:     Mental Status: She is alert.     Comments: Gross sensation intact to right toes distally   Psychiatric:        Mood and Affect: Mood normal.        Behavior: Behavior normal.        Thought Content: Thought content normal.      UC Treatments / Results  Labs (all labs ordered are listed, but only abnormal results are displayed) Labs Reviewed - No data to display  EKG   Radiology No results found.  Procedures Procedures (including critical care time)  Medications Ordered in UC Medications - No data to display  Initial Impression / Assessment and Plan / UC Course  I have reviewed the triage vital signs and the nursing notes.  Pertinent labs & imaging results that were available during my care of the patient were reviewed by me and considered in my medical decision making (see chart for details).    Will treat with antibiotics given redness and swelling with exquisite pain as well as low dose diclofenac  for anti-inflammation given known kidney disease. Steroids avoided due to DM. Recommended further evaluation in the ED with any worsening or with PCP if symptoms persist.   Final Clinical Impressions(s) / UC Diagnoses   Final diagnoses:  Pain and swelling of right ankle  Uncontrolled type 2 diabetes mellitus with hyperglycemia (HCC)  Stage 4 chronic kidney disease (HCC)     Discharge Instructions        Follow up with PCP if no gradual improvement.   Report to ED with any worsening.    ED Prescriptions     Medication Sig Dispense Auth. Provider   diclofenac  (VOLTAREN ) 25 MG EC tablet Take 1 tablet (25 mg total) by mouth 2 (two) times daily for 7 days. 14 tablet Jami Mcclintock F, PA-C   doxycycline  (VIBRAMYCIN ) 100 MG capsule Take 1 capsule (100 mg total) by mouth 2 (two) times daily. 14 capsule Vernestine Gondola, PA-C      PDMP not reviewed this encounter.   Vernestine Gondola, PA-C 03/05/24 [phone]

## 2024-03-25 ENCOUNTER — Ambulatory Visit: Admitting: Student

## 2024-03-25 VITALS — BP 112/60 | HR 78 | Temp 97.8°F | Ht 62.0 in | Wt 165.6 lb

## 2024-03-25 DIAGNOSIS — I10 Essential (primary) hypertension: Secondary | ICD-10-CM | POA: Diagnosis not present

## 2024-03-25 DIAGNOSIS — Z794 Long term (current) use of insulin: Secondary | ICD-10-CM

## 2024-03-25 DIAGNOSIS — F17210 Nicotine dependence, cigarettes, uncomplicated: Secondary | ICD-10-CM | POA: Diagnosis not present

## 2024-03-25 DIAGNOSIS — M25471 Effusion, right ankle: Secondary | ICD-10-CM | POA: Diagnosis not present

## 2024-03-25 DIAGNOSIS — Z7984 Long term (current) use of oral hypoglycemic drugs: Secondary | ICD-10-CM

## 2024-03-25 DIAGNOSIS — Z7985 Long-term (current) use of injectable non-insulin antidiabetic drugs: Secondary | ICD-10-CM | POA: Diagnosis not present

## 2024-03-25 DIAGNOSIS — I4892 Unspecified atrial flutter: Secondary | ICD-10-CM

## 2024-03-25 DIAGNOSIS — F17200 Nicotine dependence, unspecified, uncomplicated: Secondary | ICD-10-CM

## 2024-03-25 DIAGNOSIS — E1165 Type 2 diabetes mellitus with hyperglycemia: Secondary | ICD-10-CM

## 2024-03-25 LAB — POCT GLYCOSYLATED HEMOGLOBIN (HGB A1C): Hemoglobin A1C: 7.5 % — AB (ref 4.0–5.6)

## 2024-03-25 LAB — GLUCOSE, CAPILLARY: Glucose-Capillary: 131 mg/dL — ABNORMAL HIGH (ref 70–99)

## 2024-03-25 MED ORDER — DEXCOM G7 SENSOR MISC: 11 refills | Status: DC

## 2024-03-25 MED ORDER — LEVOCETIRIZINE DIHYDROCHLORIDE 5 MG PO TABS: 5.0000 mg | ORAL_TABLET | Freq: Every day | ORAL | 3 refills | Status: AC

## 2024-03-25 MED ORDER — INSULIN DEGLUDEC 100 UNIT/ML ~~LOC~~ SOPN: 5.0000 [IU] | PEN_INJECTOR | Freq: Every day | SUBCUTANEOUS | 5 refills | Status: DC

## 2024-03-25 MED ORDER — AMIODARONE HCL 100 MG PO TABS: 100.0000 mg | ORAL_TABLET | Freq: Every day | ORAL | 3 refills | Status: DC

## 2024-03-25 NOTE — Assessment & Plan Note (Signed)
 Status: controlled. BP today 112/60. Reports taking bisoprolol  2.5 mg daily, Farxiga  10 mg, spiro 12.5 mg and torsemide  80 mg BID.  At last visit 01/22/24, was to stop BiDil  which she states she did. Last BMP in 01/22/24 was normal electrolytes, unchanged CKD 4. Denies lightheadedness or dizziness.   Plan -Continue: bisoprolol , Farxiga , spironolactone , torsemide   -BMP at next OV

## 2024-03-25 NOTE — Patient Instructions (Addendum)
 Thank you, [name] for allowing us  to provide your care today. Today we discussed:  -A1c improved to 7.5% -DECREASE your Tresiba  to 5 units once a day  -Elevate your legs and try compression stockings for ankle swelling   Follow up: 1 month   Should you have any questions or concerns please call the internal medicine clinic at [phone].    Muna Demers, D.O. [facility]

## 2024-03-25 NOTE — Assessment & Plan Note (Signed)
 Status: controlled. Last A1c of 7.8 in 12/2023.  A1c today is 7.5.  Currently taking Ozempic  1 mg, Farxiga  10 mg, Tresiba  10 units.  Patient endorses hypoglycemia, 50-60 in AM. Wears Dexcom but did not bring device today.   Plan -Continue Ozempic  and Faxiga -Decrease Tresiba  to 5 units daily  -A1c in 3 months -Ophthalmology exam: completed 10/2023 -Urine ACR completed 04/2023, elevated, on SGLT2, not tolerate ACEi in past -LDL 122 on 12/2023, on atorvastatin  80 but no recent dispense records, will refill and encourage patient adherence

## 2024-03-25 NOTE — Progress Notes (Unsigned)
 CC: T2DM  HPI:  [name] is a 73 y.o. female living with a history stated below and presents today for T2DM. Please see problem based assessment and plan for additional details.  Past Medical History:  Diagnosis Date   Acute metabolic encephalopathy 07/26/2022   ATN (acute tubular necrosis) (HCC) 07/15/2014   Automatic implantable cardioverter-defibrillator in situ    Automatic implantable cardioverter-defibrillator in situ 05/04/2007   Qualifier: Diagnosis of  By: [place]     Blood transfusion without reported diagnosis    Cardiac defibrillator in situ    Atlas II VR (SJM) implanted by Dr Waddell   CHF (congestive heart failure) (HCC)    Chronic combined systolic and diastolic heart failure (HCC)    a. EF 35-40% in past;  b. Echo 7/13:  EF 45-50%, Gr 2 diast dysfn, mild AI, mild MAC, trivial MR, mild LAE, PASP 47.   Chronic ulcer of leg (HCC)    04-09-15 resolved-not a problem.   Colon polyps 04/12/2013   Rectosigmoid polyp   COPD (chronic obstructive pulmonary disease) (HCC)    O2 at night   Depression    Diabetes mellitus    Diabetes mellitus, type 2 (HCC) 04/03/2012   HIGH RISK FEET.. Please have patient take shoes and socks off every visit for visual foot inspection.     Eczema    Elevated alkaline phosphatase level    GGT and 5'nucleotidase 8/13 normal   Health care maintenance 01/22/2013   Surgically absent cervix- no pap needed (Path report 07/2000: uterine body with attached bilateral  adnexa and separate cervix.)   History of oxygen  administration    oxygen  @ 2 l/m nasally bedtime 24/7   HTN (hypertension)    Hx of cardiac cath    a. LHC 2003 normal;  b. LHC 6/13:  Mild calcification in the LM, o/w normal coronary arteries, EF 45%.    Hyperkalemia 08/08/2017   Hyperlipidemia    Elevated triglyceride in 2019   Hyperlipidemia associated with type 2 diabetes mellitus (HCC) 05/25/2007   Qualifier: Diagnosis of  By: Gladis FNP, [place]      Hyperthyroidism, subclinical    HYPERTHYROIDISM, SUBCLINICAL 05/06/2009   Qualifier: Diagnosis of  By: Rolan Hough     Implantable cardioverter-defibrillator (ICD) generator end of life    Lipoma    Need for hepatitis C screening test 04/25/2019   NICM (nonischemic cardiomyopathy) (HCC)    Obesity    On home oxygen  therapy    2L; 24/7 (10/11/2016)   Post menopausal syndrome    Shortness of breath 07/14/2017   Skin rash 07/12/2018   Sleep apnea    pt denies 04/12/2013    Current Outpatient Medications on File Prior to Visit  Medication Sig Dispense Refill   Accu-Chek Softclix Lancets lancets Check BG as directed. 100 each 3   albuterol  (VENTOLIN  HFA) 108 (90 Base) MCG/ACT inhaler Inhale 2 puffs into the lungs every 6 (six) hours as needed for wheezing or shortness of breath. 8 g 2   ANORO ELLIPTA  62.5-25 MCG/ACT AEPB INHALE 1 PUFF INTO THE LUNGS DAILY 60 each 2   apixaban  (ELIQUIS ) 5 MG TABS tablet Take 1 tablet (5 mg total) by mouth 2 (two) times daily. 60 tablet 2   aspirin  EC 81 MG tablet Take 81 mg by mouth daily. Swallow whole.     atorvastatin  (LIPITOR ) 80 MG tablet Take 80 mg by mouth at bedtime.     bisoprolol  (ZEBETA ) 5 MG tablet Take 0.5  tablets (2.5 mg total) by mouth daily. 15 tablet 11   calcium -vitamin D  (OSCAL WITH D) 500-200 MG-UNIT tablet Take 1 tablet by mouth 2 (two) times daily. 90 tablet 6   Continuous Glucose Receiver (DEXCOM G7 RECEIVER) DEVI Place new sensor every 10 days, use to monitor blood sugar continuously 1 each 0   dapagliflozin  propanediol (FARXIGA ) 10 MG TABS tablet Take 1 tablet (10 mg total) by mouth daily. 90 tablet 1   doxycycline  (VIBRAMYCIN ) 100 MG capsule Take 1 capsule (100 mg total) by mouth 2 (two) times daily. 14 capsule 0   Insulin  Pen Needle (PEN NEEDLES) 32G X 4 MM MISC 1 Needle by Does not apply route daily. 100 each 1   ketoconazole  (NIZORAL ) 2 % shampoo Apply 1 Application topically once a week.     Lancets Misc. (ACCU-CHEK SOFTCLIX  LANCET DEV) KIT Check BG as directed. 1 kit 1   OXYGEN  Inhale 2 L into the lungs at bedtime.     OZEMPIC , 1 MG/DOSE, 4 MG/3ML SOPN Inject 1 mg into the skin once a week. Takes on Saturdays     potassium chloride  SA (KLOR-CON  M) 20 MEQ tablet Take 20 mEq by mouth 2 (two) times daily.     spironolactone  (ALDACTONE ) 25 MG tablet Take 0.5 tablets (12.5 mg total) by mouth daily. 45 tablet 3   torsemide  (DEMADEX ) 20 MG tablet Take 80 mg by mouth 2 (two) times daily.     triamcinolone  ointment (KENALOG ) 0.1 % Apply 1 Application topically 2 (two) times daily. Mix with Eucerin cream     No current facility-administered medications on file prior to visit.    Family History  Problem Relation Age of Onset   Stroke Mother    Seizures Father    Heart disease Father    Diabetes Sister    Asthma Maternal Aunt        aunts   Asthma Maternal Uncle        uncles   Heart disease Paternal Aunt        aunts   Heart disease Paternal Uncle        uncles   Heart disease Maternal Aunt        aunts   Heart disease Maternal Uncle        uncles   Heart disease Maternal Grandfather    Breast cancer Daughter    Breast cancer Cousin    Asthma Grandchild    Colon cancer Neg Hx    Colon polyps Neg Hx    Esophageal cancer Neg Hx    Kidney disease Neg Hx    Gallbladder disease Neg Hx     Social History   Socioeconomic History   Marital status: Significant Other    Spouse name: Not on file   Number of children: 3   Years of education: 11   Highest education level: 11th grade  Occupational History   Occupation: disabled  Tobacco Use   Smoking status: Every Day    Current packs/day: 0.50    Average packs/day: 0.5 packs/day for 45.0 years (22.5 ttl pk-yrs)    Types: Cigarettes   Smokeless tobacco: Never   Tobacco comments:    currently smoking  10 cigs per day  Vaping Use   Vaping status: Never Used  Substance and Sexual Activity   Alcohol use: Not Currently    Alcohol/week: 3.0 standard  drinks of alcohol    Types: 3 Shots of liquor per week   Drug use: No  Sexual activity: Yes    Partners: Male    Birth control/protection: Surgical  Other Topics Concern   Not on file  Social History Narrative   ** Merged History Encounter **       Married   Patient enjoys going to [employer], church and spending time with family   Social Drivers of Corporate investment banker Strain: Low Risk  (01/09/2024)   Overall Financial Resource Strain (CARDIA)    Difficulty of Paying Living Expenses: Not hard at all  Food Insecurity: No Food Insecurity (01/09/2024)   Hunger Vital Sign    Worried About Running Out of Food in the Last Year: Never true    Ran Out of Food in the Last Year: Never true  Transportation Needs: No Transportation Needs (01/09/2024)   PRAPARE - Administrator, Civil Service (Medical): No    Lack of Transportation (Non-Medical): No  Physical Activity: Inactive (01/09/2024)   Exercise Vital Sign    Days of Exercise per Week: 0 days    Minutes of Exercise per Session: 0 min  Stress: No Stress Concern Present (01/09/2024)   [employer] of Occupational Health - Occupational Stress Questionnaire    Feeling of Stress : Not at all  Social Connections: Moderately Integrated (01/09/2024)   Social Connection and Isolation Panel    Frequency of Communication with Friends and Family: More than three times a week    Frequency of Social Gatherings with Friends and Family: More than three times a week    Attends Religious Services: More than 4 times per year    Active Member of [employer] or Organizations: Yes    Attends Banker Meetings: More than 4 times per year    Marital Status: Widowed  Intimate Partner Violence: Not At Risk (01/09/2024)   Humiliation, Afraid, Rape, and Kick questionnaire    Fear of Current or Ex-Partner: No    Emotionally Abused: No    Physically Abused: No    Sexually Abused: No    Review of Systems: ROS negative except for what is  noted on the assessment and plan.  Vitals:   03/25/24 0959  BP: 112/60  Pulse: 78  Temp: 97.8 F (36.6 C)  TempSrc: Oral  SpO2: 99%  Weight: 165 lb 9.6 oz (75.1 kg)  Height: 5' 2 (1.575 m)   Physical Exam: Constitutional: alert, sitting in wheelchair, in no acute distress Cardiovascular: regular rate and rhythm Pulmonary/Chest: normal work of breathing on room air, lungs clear to auscultation bilaterally MSK: mild swelling of right ankle, no TTP or erythema, intact ROM of R ankle and toes, no R calf tenderness  Neurological: alert & oriented x 3 Skin: warm and dry  Assessment & Plan:   Essential hypertension Status: controlled. BP today 112/60. Reports taking bisoprolol  2.5 mg daily, Farxiga  10 mg, spiro 12.5 mg and torsemide  80 mg BID.  At last visit 01/22/24, was to stop BiDil  which she states she did. Last BMP in 01/22/24 was normal electrolytes, unchanged CKD 4. Denies lightheadedness or dizziness.   Plan -Continue: bisoprolol , Farxiga , spironolactone , torsemide   -BMP at next OV  Paroxysmal atrial flutter (HCC) Refilled amiodarone  100 mg daily. Remains on Eliquis  5 mg BID.   Uncontrolled type 2 diabetes mellitus with hyperglycemia (HCC) Status: controlled. Last A1c of 7.8 in 12/2023.  A1c today is 7.5.  Currently taking Ozempic  1 mg, Farxiga  10 mg, Tresiba  10 units.  Patient endorses hypoglycemia, 50-60 in AM. Wears Dexcom but did not  bring device today.   Plan -Continue Ozempic  and Faxiga -Decrease Tresiba  to 5 units daily  -A1c in 3 months -Ophthalmology exam: completed 10/2023 -Urine ACR completed 04/2023, elevated, on SGLT2, not tolerate ACEi in past -LDL 122 on 12/2023, on atorvastatin  80    Tobacco use disorder Reports smoking about 1/2 ppd. Counseled on smoking cessation and risks of further cigarette use. Patient declined resources, states working on cutting back on her own, declined starting any medication at this time.   Time spent for smoking/tobacco cessation  counseling: 4 minutes  Plan -Continue smoking cessation counseling    Patient discussed with Dr. Chambliss  Jaylyn Booher, D.O. [facility] Internal Medicine, PGY-2 Phone: [phone] Date 03/25/2024 Time 3:10 PM

## 2024-03-25 NOTE — Assessment & Plan Note (Signed)
 Reports smoking about 1/2 ppd. Counseled on smoking cessation and risks of further cigarette use. Patient declined resources, states working on cutting back on her own, declined starting any medication at this time.   Time spent for smoking/tobacco cessation counseling: 4 minutes  Plan -Continue smoking cessation counseling

## 2024-03-25 NOTE — Assessment & Plan Note (Signed)
 Refilled amiodarone  100 mg daily. Remains on Eliquis  5 mg BID.

## 2024-03-26 ENCOUNTER — Telehealth: Payer: Self-pay

## 2024-03-26 ENCOUNTER — Encounter: Payer: Self-pay | Admitting: Student

## 2024-03-26 DIAGNOSIS — M25471 Effusion, right ankle: Secondary | ICD-10-CM | POA: Insufficient documentation

## 2024-03-26 MED ORDER — ATORVASTATIN CALCIUM 80 MG PO TABS: 80.0000 mg | ORAL_TABLET | Freq: Every day | ORAL | 3 refills | Status: DC

## 2024-03-26 NOTE — Progress Notes (Signed)
.  attest

## 2024-03-26 NOTE — Progress Notes (Signed)
 Internal Medicine Clinic Attending  Case discussed with the resident at the time of the visit.  We reviewed the resident's history and exam and pertinent patient test results.  I agree with the assessment, diagnosis, and plan of care documented in the resident's note.

## 2024-03-26 NOTE — Telephone Encounter (Signed)
 [name] (Key: [facility]) PA Case ID #: [id number] Rx #: [id number] Need Help? Call us  at [phone] Outcome Approved today by [employer] 2017 NCPDP Request Reference Number: [id number]. AMIODARONE  TAB 100MG  is approved through 10/02/2024. Your patient may now fill this prescription and it will be covered. Effective Date: 03/26/2024 Authorization Expiration Date: 10/02/2024 Drug Amiodarone  HCl 100MG  tablets ePA cloud logo Form [employer] Medicare Part D Electronic Prior Authorization Form (2017 NCPDP) Original Claim Info [id number] Provide Exception Process Printed NoticeNon-Form, Dr. Vada ePA at [email or web address] TAB 200MG  PREF`D

## 2024-03-26 NOTE — Telephone Encounter (Signed)
 Prior Authorization for patient (Amiodarone  HCl 100MG  tablets) came through on cover my meds was submitted with last office notes awaiting approval or denial.  [id number]

## 2024-03-26 NOTE — Assessment & Plan Note (Signed)
 Seen at [facility] 6/3 for right ankle swelling, pain and erythema, was given abx and diclofenac . Patient states improved, no tenderness, pain or erythema. Has some swelling which is seen bilaterally and chronically. Exam today with mild swelling of ankles without warmth or TTP. Skin warm and dry and intact ROM. Remains on torsemide  and weight has decreased/improved since prior OV. VSS. ABI in 2023 were normal. Discussed supportive care including compression stockings and elevating legs to help.

## 2024-03-29 DIAGNOSIS — I509 Heart failure, unspecified: Secondary | ICD-10-CM | POA: Diagnosis not present

## 2024-03-29 DIAGNOSIS — J449 Chronic obstructive pulmonary disease, unspecified: Secondary | ICD-10-CM | POA: Diagnosis not present

## 2024-04-10 ENCOUNTER — Other Ambulatory Visit: Payer: Self-pay | Admitting: Student

## 2024-04-10 ENCOUNTER — Other Ambulatory Visit: Payer: Self-pay

## 2024-04-10 MED ORDER — TORSEMIDE 20 MG PO TABS: 80.0000 mg | ORAL_TABLET | Freq: Two times a day (BID) | ORAL | 3 refills | Status: DC

## 2024-04-10 NOTE — Telephone Encounter (Unsigned)
 Copied from CRM [id number]. Topic: Clinical - Medication Refill >> Apr 10, 2024 10:47 AM Miquel SAILOR wrote: Medication: torsemide  (DEMADEX ) 20 MG tablet   Has the patient contacted their pharmacy? Yes (Agent: If no, request that the patient contact the pharmacy for the refill. If patient does not wish to contact the pharmacy document the reason why and proceed with request.) (Agent: If yes, when and what did the pharmacy advise?)  This is the patient's preferred pharmacy:  [facility] STORE #[id number] [facility], [place] - [address] AT [facility] [address] [place] [address] Phone: [phone] Fax: [phone]  [name] [address] [place] [address] Phone: [phone] Fax: [phone]  Is this the correct pharmacy for this prescription? Yes If no, delete pharmacy and type the correct one.   Has the prescription been filled recently? Yes  Is the patient out of the medication? Yes  Has the patient been seen for an appointment in the last year OR does the patient have an upcoming appointment? Yes  Can we respond through MyChart? no  Agent: Please be advised that Rx refills may take up to 3 business days. We ask that you follow-up with your pharmacy.

## 2024-04-13 ENCOUNTER — Observation Stay (HOSPITAL_COMMUNITY)

## 2024-04-13 ENCOUNTER — Emergency Department (HOSPITAL_COMMUNITY)

## 2024-04-13 ENCOUNTER — Encounter (HOSPITAL_COMMUNITY): Payer: Self-pay

## 2024-04-13 ENCOUNTER — Observation Stay (HOSPITAL_COMMUNITY)
Admission: EM | Admit: 2024-04-13 | Discharge: 2024-04-17 | Disposition: A | Discharge status: Home / Self Care | Attending: Internal Medicine | Admitting: Internal Medicine

## 2024-04-13 ENCOUNTER — Other Ambulatory Visit: Payer: Self-pay

## 2024-04-13 DIAGNOSIS — I517 Cardiomegaly: Secondary | ICD-10-CM | POA: Diagnosis not present

## 2024-04-13 DIAGNOSIS — R6889 Other general symptoms and signs: Secondary | ICD-10-CM | POA: Diagnosis not present

## 2024-04-13 DIAGNOSIS — I4892 Unspecified atrial flutter: Secondary | ICD-10-CM | POA: Insufficient documentation

## 2024-04-13 DIAGNOSIS — M7989 Other specified soft tissue disorders: Secondary | ICD-10-CM | POA: Diagnosis not present

## 2024-04-13 DIAGNOSIS — R609 Edema, unspecified: Secondary | ICD-10-CM | POA: Diagnosis not present

## 2024-04-13 DIAGNOSIS — I5042 Chronic combined systolic (congestive) and diastolic (congestive) heart failure: Secondary | ICD-10-CM | POA: Diagnosis not present

## 2024-04-13 DIAGNOSIS — E1122 Type 2 diabetes mellitus with diabetic chronic kidney disease: Secondary | ICD-10-CM | POA: Diagnosis not present

## 2024-04-13 DIAGNOSIS — I130 Hypertensive heart and chronic kidney disease with heart failure and stage 1 through stage 4 chronic kidney disease, or unspecified chronic kidney disease: Secondary | ICD-10-CM | POA: Diagnosis not present

## 2024-04-13 DIAGNOSIS — R0600 Dyspnea, unspecified: Secondary | ICD-10-CM | POA: Diagnosis not present

## 2024-04-13 DIAGNOSIS — M25471 Effusion, right ankle: Secondary | ICD-10-CM | POA: Diagnosis present

## 2024-04-13 DIAGNOSIS — Z79899 Other long term (current) drug therapy: Secondary | ICD-10-CM | POA: Diagnosis not present

## 2024-04-13 DIAGNOSIS — N184 Chronic kidney disease, stage 4 (severe): Secondary | ICD-10-CM | POA: Insufficient documentation

## 2024-04-13 DIAGNOSIS — J9621 Acute and chronic respiratory failure with hypoxia: Secondary | ICD-10-CM | POA: Diagnosis present

## 2024-04-13 DIAGNOSIS — R918 Other nonspecific abnormal finding of lung field: Secondary | ICD-10-CM | POA: Insufficient documentation

## 2024-04-13 DIAGNOSIS — Z7982 Long term (current) use of aspirin: Secondary | ICD-10-CM | POA: Diagnosis not present

## 2024-04-13 DIAGNOSIS — Z1152 Encounter for screening for COVID-19: Secondary | ICD-10-CM | POA: Diagnosis not present

## 2024-04-13 DIAGNOSIS — I4581 Long QT syndrome: Secondary | ICD-10-CM | POA: Diagnosis not present

## 2024-04-13 DIAGNOSIS — E1165 Type 2 diabetes mellitus with hyperglycemia: Secondary | ICD-10-CM

## 2024-04-13 DIAGNOSIS — I5023 Acute on chronic systolic (congestive) heart failure: Secondary | ICD-10-CM | POA: Diagnosis present

## 2024-04-13 DIAGNOSIS — R0602 Shortness of breath: Secondary | ICD-10-CM | POA: Diagnosis not present

## 2024-04-13 DIAGNOSIS — J449 Chronic obstructive pulmonary disease, unspecified: Secondary | ICD-10-CM | POA: Insufficient documentation

## 2024-04-13 DIAGNOSIS — Z794 Long term (current) use of insulin: Secondary | ICD-10-CM | POA: Insufficient documentation

## 2024-04-13 DIAGNOSIS — R0989 Other specified symptoms and signs involving the circulatory and respiratory systems: Secondary | ICD-10-CM | POA: Diagnosis not present

## 2024-04-13 DIAGNOSIS — J9601 Acute respiratory failure with hypoxia: Secondary | ICD-10-CM | POA: Diagnosis not present

## 2024-04-13 DIAGNOSIS — Z4789 Encounter for other orthopedic aftercare: Secondary | ICD-10-CM | POA: Diagnosis not present

## 2024-04-13 DIAGNOSIS — M7731 Calcaneal spur, right foot: Secondary | ICD-10-CM | POA: Diagnosis not present

## 2024-04-13 DIAGNOSIS — E1129 Type 2 diabetes mellitus with other diabetic kidney complication: Secondary | ICD-10-CM | POA: Diagnosis present

## 2024-04-13 DIAGNOSIS — R059 Cough, unspecified: Secondary | ICD-10-CM | POA: Diagnosis not present

## 2024-04-13 DIAGNOSIS — M25571 Pain in right ankle and joints of right foot: Secondary | ICD-10-CM | POA: Diagnosis not present

## 2024-04-13 DIAGNOSIS — R9431 Abnormal electrocardiogram [ECG] [EKG]: Secondary | ICD-10-CM

## 2024-04-13 DIAGNOSIS — Z950 Presence of cardiac pacemaker: Secondary | ICD-10-CM | POA: Diagnosis not present

## 2024-04-13 DIAGNOSIS — N179 Acute kidney failure, unspecified: Secondary | ICD-10-CM | POA: Diagnosis not present

## 2024-04-13 LAB — COMPREHENSIVE METABOLIC PANEL WITH GFR
ALT: 13 U/L (ref 0–44)
AST: 17 U/L (ref 15–41)
Albumin: 3.5 g/dL (ref 3.5–5.0)
Alkaline Phosphatase: 99 U/L (ref 38–126)
Anion gap: 15 (ref 5–15)
BUN: 35 mg/dL — ABNORMAL HIGH (ref 8–23)
CO2: 27 mmol/L (ref 22–32)
Calcium: 9.3 mg/dL (ref 8.9–10.3)
Chloride: 100 mmol/L (ref 98–111)
Creatinine, Ser: 2.54 mg/dL — ABNORMAL HIGH (ref 0.44–1.00)
GFR, Estimated: 20 mL/min — ABNORMAL LOW (ref >60)
Glucose, Bld: 142 mg/dL — ABNORMAL HIGH (ref 70–99)
Potassium: 3.7 mmol/L (ref 3.5–5.1)
Sodium: 142 mmol/L (ref 135–145)
Total Bilirubin: 0.5 mg/dL (ref 0.0–1.2)
Total Protein: 7.4 g/dL (ref 6.5–8.1)

## 2024-04-13 LAB — CBC WITH DIFFERENTIAL/PLATELET
Abs Immature Granulocytes: 0.05 K/uL (ref 0.00–0.07)
Basophils Absolute: 0.1 K/uL (ref 0.0–0.1)
Basophils Relative: 1 %
Eosinophils Absolute: 0.3 K/uL (ref 0.0–0.5)
Eosinophils Relative: 3 %
HCT: 39.6 % (ref 36.0–46.0)
Hemoglobin: 12.3 g/dL (ref 12.0–15.0)
Immature Granulocytes: 1 %
Lymphocytes Relative: 20 %
Lymphs Abs: 2 K/uL (ref 0.7–4.0)
MCH: 25.4 pg — ABNORMAL LOW (ref 26.0–34.0)
MCHC: 31.1 g/dL (ref 30.0–36.0)
MCV: 81.6 fL (ref 80.0–100.0)
Monocytes Absolute: 0.8 K/uL (ref 0.1–1.0)
Monocytes Relative: 8 %
Neutro Abs: 6.7 K/uL (ref 1.7–7.7)
Neutrophils Relative %: 67 %
Platelets: 346 K/uL (ref 150–400)
RBC: 4.85 MIL/uL (ref 3.87–5.11)
RDW: 15.1 % (ref 11.5–15.5)
WBC: 9.9 K/uL (ref 4.0–10.5)
nRBC: 0 % (ref 0.0–0.2)

## 2024-04-13 LAB — RESP PANEL BY RT-PCR (RSV, FLU A&B, COVID)  RVPGX2
Influenza A by PCR: NEGATIVE
Influenza B by PCR: NEGATIVE
Resp Syncytial Virus by PCR: NEGATIVE
SARS Coronavirus 2 by RT PCR: NEGATIVE

## 2024-04-13 LAB — I-STAT VENOUS BLOOD GAS, ED
Acid-Base Excess: 7 mmol/L — ABNORMAL HIGH (ref 0.0–2.0)
Bicarbonate: 30.4 mmol/L — ABNORMAL HIGH (ref 20.0–28.0)
Calcium, Ion: 1 mmol/L — ABNORMAL LOW (ref 1.15–1.40)
HCT: 38 % (ref 36.0–46.0)
Hemoglobin: 12.9 g/dL (ref 12.0–15.0)
O2 Saturation: 84 %
Potassium: 3.6 mmol/L (ref 3.5–5.1)
Sodium: 140 mmol/L (ref 135–145)
TCO2: 32 mmol/L (ref 22–32)
pCO2, Ven: 38.3 mmHg — ABNORMAL LOW (ref 44–60)
pH, Ven: 7.508 — ABNORMAL HIGH (ref 7.25–7.43)
pO2, Ven: 44 mmHg (ref 32–45)

## 2024-04-13 LAB — BRAIN NATRIURETIC PEPTIDE: B Natriuretic Peptide: 21.7 pg/mL (ref 0.0–100.0)

## 2024-04-13 LAB — GLUCOSE, CAPILLARY: Glucose-Capillary: 157 mg/dL — ABNORMAL HIGH (ref 70–99)

## 2024-04-13 LAB — TROPONIN I (HIGH SENSITIVITY)
Troponin I (High Sensitivity): 15 ng/L (ref <18)
Troponin I (High Sensitivity): 15 ng/L (ref <18)

## 2024-04-13 LAB — MAGNESIUM: Magnesium: 1.8 mg/dL (ref 1.7–2.4)

## 2024-04-13 MED ORDER — ALBUTEROL SULFATE (2.5 MG/3ML) 0.083% IN NEBU: 3.0000 mL | INHALATION_SOLUTION | Freq: Four times a day (QID) | RESPIRATORY_TRACT | Status: DC | PRN

## 2024-04-13 MED ORDER — APIXABAN 5 MG PO TABS
5.0000 mg | ORAL_TABLET | Freq: Two times a day (BID) | ORAL | Status: DC
  Administered 2024-04-13 – 2024-04-17 (×8): 5 mg via ORAL
  Filled 2024-04-13 (×8): qty 1

## 2024-04-13 MED ORDER — ASPIRIN 81 MG PO TBEC
81.0000 mg | DELAYED_RELEASE_TABLET | Freq: Every day | ORAL | Status: DC
  Administered 2024-04-14 – 2024-04-17 (×4): 81 mg via ORAL
  Filled 2024-04-13 (×4): qty 1

## 2024-04-13 MED ORDER — CETIRIZINE HCL 10 MG PO TABS
10.0000 mg | ORAL_TABLET | Freq: Every day | ORAL | Status: DC
  Administered 2024-04-13 – 2024-04-16 (×4): 10 mg via ORAL
  Filled 2024-04-13 (×5): qty 1

## 2024-04-13 MED ORDER — INSULIN GLARGINE-YFGN 100 UNIT/ML ~~LOC~~ SOLN
5.0000 [IU] | Freq: Every day | SUBCUTANEOUS | Status: DC
  Administered 2024-04-15 – 2024-04-17 (×3): 5 [IU] via SUBCUTANEOUS
  Filled 2024-04-13 (×6): qty 0.05

## 2024-04-13 MED ORDER — SPIRONOLACTONE 12.5 MG HALF TABLET
12.5000 mg | ORAL_TABLET | Freq: Every day | ORAL | Status: DC
  Administered 2024-04-14: 12.5 mg via ORAL
  Filled 2024-04-13: qty 1

## 2024-04-13 MED ORDER — AMIODARONE HCL 200 MG PO TABS
100.0000 mg | ORAL_TABLET | Freq: Every day | ORAL | Status: DC
  Administered 2024-04-14 – 2024-04-15 (×2): 100 mg via ORAL
  Filled 2024-04-13 (×3): qty 1

## 2024-04-13 MED ORDER — ATORVASTATIN CALCIUM 80 MG PO TABS
80.0000 mg | ORAL_TABLET | Freq: Every day | ORAL | Status: DC
  Administered 2024-04-13 – 2024-04-16 (×4): 80 mg via ORAL
  Filled 2024-04-13 (×2): qty 1
  Filled 2024-04-13: qty 2
  Filled 2024-04-13: qty 1

## 2024-04-13 MED ORDER — DAPAGLIFLOZIN PROPANEDIOL 10 MG PO TABS
10.0000 mg | ORAL_TABLET | Freq: Every day | ORAL | Status: DC
  Administered 2024-04-14: 10 mg via ORAL
  Filled 2024-04-13: qty 1

## 2024-04-13 MED ORDER — UMECLIDINIUM-VILANTEROL 62.5-25 MCG/ACT IN AEPB
1.0000 | INHALATION_SPRAY | Freq: Every day | RESPIRATORY_TRACT | Status: DC
  Administered 2024-04-15 – 2024-04-17 (×3): 1 via RESPIRATORY_TRACT
  Filled 2024-04-13: qty 14

## 2024-04-13 MED ORDER — LEVOCETIRIZINE DIHYDROCHLORIDE 5 MG PO TABS: 5.0000 mg | ORAL_TABLET | Freq: Every day | ORAL | Status: DC

## 2024-04-13 MED ORDER — OYSTER SHELL CALCIUM/D3 500-5 MG-MCG PO TABS
1.0000 | ORAL_TABLET | Freq: Two times a day (BID) | ORAL | Status: DC
  Administered 2024-04-13 – 2024-04-17 (×8): 1 via ORAL
  Filled 2024-04-13 (×8): qty 1

## 2024-04-13 MED ORDER — TORSEMIDE 20 MG PO TABS
80.0000 mg | ORAL_TABLET | Freq: Two times a day (BID) | ORAL | Status: DC
  Administered 2024-04-14: 80 mg via ORAL
  Filled 2024-04-13: qty 4

## 2024-04-13 NOTE — ED Triage Notes (Signed)
 Pt to ED via [facility] from the [facility] theater c/o [facility] with exertion x 2 days, hx chf, bilat LEE. No medications given by EMS.  Pt room air sat with EMS 88%, placed on 2L, Pt at baseline wear night o2.   Last VS: 110/67, P98, 100% 6L, CBG 183. RR20.

## 2024-04-13 NOTE — Progress Notes (Signed)
 Patient just arrived to the floor, transferred from ED.

## 2024-04-13 NOTE — ED Provider Notes (Signed)
 [place] EMERGENCY DEPARTMENT AT [facility] Provider Note   CSN: [id number] Arrival date & time: 04/13/24  1846     Patient presents with: Shortness of Breath   [name] is a 73 y.o. female.   -year-old female presents today for concern of shortness of breath.  She states she was [place] bar on [place] when suddenly she became hot and was short of breath.  She does endorse some exertional dyspnea over the past few days.  Endorses worsening lower extremity edema over the past week.  She missed 1 dose of the torsemide  because she ran out and had to wait for a refill to be sent in.  Has history of CHF as well as pulmonary hypertension, and restrictive lung disease.  She was found to be hypoxic at 88% when EMS arrived on scene.  She was placed on supplemental O2.  The emergency department this had to be increased to 4 L supplemental O2.  She does have O2 at home but only wears this at nighttime.  The history is provided by the patient. No language interpreter was used.       Prior to Admission medications   Medication Sig Start Date End Date Taking? Authorizing Provider  Accu-Chek Softclix Lancets lancets Check BG as directed. 11/03/22   Mapp, Tavien, MD  albuterol  (VENTOLIN  HFA) 108 (90 Base) MCG/ACT inhaler Inhale 2 puffs into the lungs every 6 (six) hours as needed for wheezing or shortness of breath. 07/20/23   Amoako, Prince, MD  amiodarone  (PACERONE ) 100 MG tablet Take 1 tablet (100 mg total) by mouth daily. 03/25/24   Elicia Sharper, DO  ANORO ELLIPTA  62.5-25 MCG/ACT AEPB INHALE 1 PUFF INTO THE LUNGS DAILY 01/08/24   Amoako, Prince, MD  apixaban  (ELIQUIS ) 5 MG TABS tablet Take 1 tablet (5 mg total) by mouth 2 (two) times daily. 11/10/23 02/08/24  Bensimhon, Toribio SAUNDERS, MD  aspirin  EC 81 MG tablet Take 81 mg by mouth daily. Swallow whole.    [provider]  atorvastatin  (LIPITOR ) 80 MG tablet Take 1 tablet (80 mg total) by mouth at bedtime. 03/26/24   Zheng, Michael,  DO  bisoprolol  (ZEBETA ) 5 MG tablet Take 0.5 tablets (2.5 mg total) by mouth daily. 08/30/23   Hayes Beckey CROME, NP  calcium -vitamin D  (OSCAL WITH D) 500-200 MG-UNIT tablet Take 1 tablet by mouth 2 (two) times daily. 02/12/20   Lovey Satterfield, MD  Continuous Glucose Receiver (DEXCOM G7 RECEIVER) DEVI Place new sensor every 10 days, use to monitor blood sugar continuously 06/26/23   Tawkaliyar, Roya, DO  Continuous Glucose Sensor (DEXCOM G7 SENSOR) MISC Place new sensor every 10 days. Use to monitor blood sugar continuously. 03/25/24   Zheng, Michael, DO  dapagliflozin  propanediol (FARXIGA ) 10 MG TABS tablet Take 1 tablet (10 mg total) by mouth daily. 08/29/23   [name], MD  doxycycline  (VIBRAMYCIN ) 100 MG capsule Take 1 capsule (100 mg total) by mouth 2 (two) times daily. 03/05/24   Billy Asberry FALCON, PA-C  insulin  degludec (TRESIBA ) 100 UNIT/ML FlexTouch Pen Inject 5 Units into the skin daily. 03/25/24   Zheng, Michael, DO  Insulin  Pen Needle (PEN NEEDLES) 32G X 4 MM MISC 1 Needle by Does not apply route daily. 12/09/22   Nguyen, Quan, DO  ketoconazole  (NIZORAL ) 2 % shampoo Apply 1 Application topically once a week. 10/16/23   [provider]  Lancets Misc. (ACCU-CHEK SOFTCLIX LANCET DEV) KIT Check BG as directed. 11/03/22   Mapp, Tavien,  MD  levocetirizine (XYZAL ) 5 MG tablet Take 1 tablet (5 mg total) by mouth at bedtime. 03/25/24   Zheng, Michael, DO  OXYGEN  Inhale 2 L into the lungs at bedtime.    [provider]  OZEMPIC , 1 MG/DOSE, 4 MG/3ML SOPN Inject 1 mg into the skin once a week. Takes on Saturdays 12/22/23   [provider]  potassium chloride  SA (KLOR-CON  M) 20 MEQ tablet Take 20 mEq by mouth 2 (two) times daily.    [provider]  spironolactone  (ALDACTONE ) 25 MG tablet Take 0.5 tablets (12.5 mg total) by mouth daily. 06/14/23   Bensimhon, Toribio SAUNDERS, MD  torsemide  (DEMADEX ) 20 MG tablet Take 4 tablets (80 mg total) by mouth 2 (two) times daily. 04/10/24    Amoako, Prince, MD  triamcinolone  ointment (KENALOG ) 0.1 % Apply 1 Application topically 2 (two) times daily. Mix with Eucerin cream 02/20/23   [provider]    Allergies: Actos [pioglitazone], Naproxen , Rosiglitazone, Metformin  and related, Tomato, Hydrocodone -acetaminophen , and Hydrocortisone    Review of Systems  Constitutional:  Negative for chills and fever.  Respiratory:  Positive for shortness of breath.   Cardiovascular:  Positive for leg swelling. Negative for chest pain.  Gastrointestinal:  Negative for abdominal pain.  Neurological:  Negative for light-headedness.  All other systems reviewed and are negative.   Updated Vital Signs BP 134/72 (BP Location: Right Arm)   Pulse 93   Temp 98.1 F (36.7 C) (Oral)   Resp 18   Ht 5' 2 (1.575 m)   Wt 74.8 kg   SpO2 98%   BMI 30.18 kg/m   Physical Exam Vitals and nursing note reviewed.  Constitutional:      General: She is not in acute distress.    Appearance: Normal appearance. She is ill-appearing (Chronically ill-appearing).  HENT:     Head: Normocephalic and atraumatic.     Nose: Nose normal.  Eyes:     Conjunctiva/sclera: Conjunctivae normal.  Cardiovascular:     Rate and Rhythm: Normal rate and regular rhythm.  Pulmonary:     Effort: Pulmonary effort is normal. No respiratory distress.  Abdominal:     General: There is no distension.     Tenderness: There is no abdominal tenderness. There is no guarding.  Musculoskeletal:        General: No deformity. Normal range of motion.     Right lower leg: No edema.     Left lower leg: No edema.     Comments: Bilateral lower extremities do appear swollen however there is no pitting edema.  Skin:    Findings: No rash.  Neurological:     Mental Status: She is alert.     (all labs ordered are listed, but only abnormal results are displayed) Labs Reviewed  CBC WITH DIFFERENTIAL/PLATELET - Abnormal; Notable for the following components:      Result Value    MCH 25.4 (*)    All other components within normal limits  COMPREHENSIVE METABOLIC PANEL WITH GFR - Abnormal; Notable for the following components:   Glucose, Bld 142 (*)    BUN 35 (*)    Creatinine, Ser 2.54 (*)    GFR, Estimated 20 (*)    All other components within normal limits  I-STAT VENOUS BLOOD GAS, ED - Abnormal; Notable for the following components:   pH, Ven 7.508 (*)    pCO2, Ven 38.3 (*)    Bicarbonate 30.4 (*)    Acid-Base Excess 7.0 (*)  Calcium , Ion 1.00 (*)    All other components within normal limits  RESP PANEL BY RT-PCR (RSV, FLU A&B, COVID)  RVPGX2  BRAIN NATRIURETIC PEPTIDE  MAGNESIUM   TROPONIN I (HIGH SENSITIVITY)  TROPONIN I (HIGH SENSITIVITY)    EKG: EKG Interpretation Date/Time:  Saturday April 13 2024 18:53:41 EDT Ventricular Rate:  94 PR Interval:  188 QRS Duration:  94 QT Interval:  438 QTC Calculation: 547 R Axis:   37  Text Interpretation: Normal sinus rhythm Lateral infarct , age undetermined Prolonged QT Abnormal ECG When compared with ECG of 17-Jan-2024 10:50, PREVIOUS ECG IS PRESENT Confirmed by Jerrol Agent ([id number]) on 04/13/2024 7:22:55 PM  Radiology: No results found.   Procedures   Medications Ordered in the ED - No data to display                                  Medical Decision Making Amount and/or Complexity of Data Reviewed Labs: ordered. Radiology: ordered.   Medical Decision Making / ED Course   This patient presents to the ED for concern of shortness of breath, hypoxia, this involves an extensive number of treatment options, and is a complaint that carries with it a high risk of complications and morbidity.  The differential diagnosis includes CHF exacerbation, asthma exacerbation, worsening pulmonary hypertension, PE   MDM: 73 year old female presents today for above-mentioned complaints.  She is chronically ill-appearing.  Found to be hypoxic to 88% when EMS arrived on scene.  She was 93% on arrival.  She was  increased to 4 L supplemental O2 via nasal cannula.  Currently satting at 100%.  Symptoms have been ongoing now for several days.  Peripheral edema for a week.  Lungs are clear.  No chest pain.  Compliant with her Eliquis .  Low suspicion for PE.   CBC without leukocytosis or anemia.  CMP with creatinine of 2.54 which slightly above her recent baseline.  Respiratory panel negative.  EKG without acute ischemic change.  Will discuss with internal medicine team for admission for hypoxia.  She would likely need an echo.  Discussed with internal medicine service.  They will evaluate patient for admission.   Lab Tests: -I ordered, reviewed, and interpreted labs.   The pertinent results include:   Labs Reviewed  CBC WITH DIFFERENTIAL/PLATELET - Abnormal; Notable for the following components:      Result Value   MCH 25.4 (*)    All other components within normal limits  COMPREHENSIVE METABOLIC PANEL WITH GFR - Abnormal; Notable for the following components:   Glucose, Bld 142 (*)    BUN 35 (*)    Creatinine, Ser 2.54 (*)    GFR, Estimated 20 (*)    All other components within normal limits  I-STAT VENOUS BLOOD GAS, ED - Abnormal; Notable for the following components:   pH, Ven 7.508 (*)    pCO2, Ven 38.3 (*)    Bicarbonate 30.4 (*)    Acid-Base Excess 7.0 (*)    Calcium , Ion 1.00 (*)    All other components within normal limits  RESP PANEL BY RT-PCR (RSV, FLU A&B, COVID)  RVPGX2  BRAIN NATRIURETIC PEPTIDE  MAGNESIUM   TROPONIN I (HIGH SENSITIVITY)  TROPONIN I (HIGH SENSITIVITY)      EKG  EKG Interpretation Date/Time:  Saturday April 13 2024 18:53:41 EDT Ventricular Rate:  94 PR Interval:  188 QRS Duration:  94 QT Interval:  438 QTC Calculation: 547  R Axis:   37  Text Interpretation: Normal sinus rhythm Lateral infarct , age undetermined Prolonged QT Abnormal ECG When compared with ECG of 17-Jan-2024 10:50, PREVIOUS ECG IS PRESENT Confirmed by Jerrol Agent ([id number]) on 04/13/2024  7:22:55 PM         Imaging Studies ordered: I ordered imaging studies including chest x-ray I independently visualized and interpreted imaging. I agree with the radiologist interpretation   Medicines ordered and prescription drug management: No orders of the defined types were placed in this encounter.   -I have reviewed the patients home medicines and have made adjustments as needed  Reevaluation: After the interventions noted above, I reevaluated the patient and found that they have :stayed the same  Co morbidities that complicate the patient evaluation  Past Medical History:  Diagnosis Date   Acute metabolic encephalopathy 07/26/2022   ATN (acute tubular necrosis) (HCC) 07/15/2014   Automatic implantable cardioverter-defibrillator in situ    Automatic implantable cardioverter-defibrillator in situ 05/04/2007   Qualifier: Diagnosis of  By: [place]     Blood transfusion without reported diagnosis    Cardiac defibrillator in situ    Atlas II VR (SJM) implanted by Dr Waddell   CHF (congestive heart failure) (HCC)    Chronic combined systolic and diastolic heart failure (HCC)    a. EF 35-40% in past;  b. Echo 7/13:  EF 45-50%, Gr 2 diast dysfn, mild AI, mild MAC, trivial MR, mild LAE, PASP 47.   Chronic ulcer of leg (HCC)    04-09-15 resolved-not a problem.   Colon polyps 04/12/2013   Rectosigmoid polyp   COPD (chronic obstructive pulmonary disease) (HCC)    O2 at night   Depression    Diabetes mellitus    Diabetes mellitus, type 2 (HCC) 04/03/2012   HIGH RISK FEET.. Please have patient take shoes and socks off every visit for visual foot inspection.     Eczema    Elevated alkaline phosphatase level    GGT and 5'nucleotidase 8/13 normal   Health care maintenance 01/22/2013   Surgically absent cervix- no pap needed (Path report 07/2000: uterine body with attached bilateral  adnexa and separate cervix.)   History of oxygen  administration    oxygen  @ 2 l/m nasally  bedtime 24/7   HTN (hypertension)    Hx of cardiac cath    a. LHC 2003 normal;  b. LHC 6/13:  Mild calcification in the LM, o/w normal coronary arteries, EF 45%.    Hyperkalemia 08/08/2017   Hyperlipidemia    Elevated triglyceride in 2019   Hyperlipidemia associated with type 2 diabetes mellitus (HCC) 05/25/2007   Qualifier: Diagnosis of  By: Gladis FNP, [place]     Hyperthyroidism, subclinical    HYPERTHYROIDISM, SUBCLINICAL 05/06/2009   Qualifier: Diagnosis of  By: Rolan Hough     Implantable cardioverter-defibrillator (ICD) generator end of life    Lipoma    Need for hepatitis C screening test 04/25/2019   NICM (nonischemic cardiomyopathy) (HCC)    Obesity    On home oxygen  therapy    2L; 24/7 (10/11/2016)   Post menopausal syndrome    Shortness of breath 07/14/2017   Skin rash 07/12/2018   Sleep apnea    pt denies 04/12/2013      Dispostion: Discussed with internal medicine service.  They will evaluate patient for admission   Final diagnoses:  Acute respiratory failure with hypoxia (HCC)  Dyspnea, unspecified type    ED Discharge Orders     None  Hildegard Loge, PA-C 04/13/24 [id number]    Jerrol Agent, MD 04/13/24 [id number]

## 2024-04-13 NOTE — H&P (Signed)
 Date: 04/13/2024               Patient Name:  [name] MRN: [id number]  DOB: 06-02-1951 Age / Sex: 73 y.o., female   PCP: [name], MD         Medical Service: Internal Medicine Teaching Service         Attending Physician: Dr. Trudy, Mliss Dragon, MD      First Contact: Dr. Remonia Romano, DO    Second Contact: Dr. Ozell Nearing, DO         After Hours (After 5p/  First Contact Pager: [phone]  weekends / holidays): Second Contact Pager: [phone]   SUBJECTIVE   Chief Complaint: Dyspnea  History of Present Illness:  [name] is a 73 year old female with HFrEF with ICD, COPD, CKD stage IV, T2DM, HTN, paroxysmal A-flutter, and tobacco use disorder who presents with 1 week of progressively worsening dyspnea on exertion.  At baseline she has dyspnea when walking on flat ground even inside her own home but has noticed this getting worse over the past week.  This morning she had to use her oxygen  which she usually only uses at nighttime.  She also went out to a restaurant and felt hot and more short of breath which triggered her to come into the hospital.  She has some associated bilateral shoulder pain that is worse with movement of her arms, nonproductive cough for the past 1-2 days, and some bilateral lower extremity edema with concentration on her right ankle.  Otherwise she denies any chest pain, fevers, nausea, vomiting, abdominal pain, increase/decrease in urine output, dysuria, change in bowel habits, lower extremity cramping, presyncope, or syncope.  No recent medication changes.  ED Course: Presented as above hypoxic and tachypneic and was placed on 2-4 L of oxygen  through nasal cannula with good response.  Otherwise vitals within normal limits.  VBG without hypoxia or hypercarbia.  Mild increase in creatinine from baseline at 2.54 but metabolic panel, CBC, BNP, and troponin otherwise normal.  EKG stable from prior and chest x-ray pending.  No medications given in the  ED.  Past Medical History HFrEF with ICD COPD Chronic respiratory failure with supplemental oxygen  use at night CKD stage IV T2DM Hypertension Paroxysmal A-fib flutter Tobacco use disorder  Meds:  Amiodarone  100 mg daily Aspirin  81 mg daily Albuterol  Anoro Ellipta  (LAMA/LABA) Eliquis  5 mg twice daily (reportedly takes this 3 times daily) Atorvastatin  80 mg daily Bisoprolol  2.5 mg daily (no corresponding pharmacy fill in the last 6 months) Farxiga  10 mg daily Tresiba  5 units daily Ozempic  1 mg weekly (Saturdays, last dose 7/12) Potassium chloride  20 mEq twice daily Spironolactone  12.5 mg daily Torsemide  80 mg twice daily Calcium  and vitamin D  supplement  Past Surgical History Past Surgical History:  Procedure Laterality Date   ABDOMINAL HYSTERECTOMY     CARDIAC CATHETERIZATION     CARDIAC CATHETERIZATION N/A 06/21/2016   Procedure: Left Heart Cath and Coronary Angiography;  Surgeon: Ozell Fell, MD;  Location: [facility] INVASIVE CV LAB;  Service: Cardiovascular;  Laterality: N/A;   CARDIAC DEFIBRILLATOR PLACEMENT  05/04/2007   SJM Atlas II VR ICD   CARDIAC DEFIBRILLATOR PLACEMENT     CATARACT EXTRACTION     od   COLONOSCOPY N/A 04/12/2013   Procedure: COLONOSCOPY;  Surgeon: Belvie JONETTA Just, MD;  Location: [facility] ENDOSCOPY;  Service: Endoscopy;  Laterality: N/A;  pt.has defibrilator   COLONOSCOPY N/A 04/16/2015   Procedure: COLONOSCOPY;  Surgeon: Toribio SHAUNNA Cedar, MD;  Location: [facility] ENDOSCOPY;  Service: Endoscopy;  Laterality: N/A;   COLONOSCOPY WITH PROPOFOL  N/A 07/21/2023   Procedure: COLONOSCOPY WITH PROPOFOL ;  Surgeon: Rollin Dover, MD;  Location: [facility] ENDOSCOPY;  Service: Gastroenterology;  Laterality: N/A;   ESOPHAGOGASTRODUODENOSCOPY N/A 04/16/2015   Procedure: ESOPHAGOGASTRODUODENOSCOPY (EGD);  Surgeon: Toribio SHAUNNA Cedar, MD;  Location: [facility];  Service: Endoscopy;  Laterality: N/A;   ESOPHAGOGASTRODUODENOSCOPY (EGD) WITH PROPOFOL  N/A 11/25/2022   Procedure:  ESOPHAGOGASTRODUODENOSCOPY (EGD) WITH PROPOFOL ;  Surgeon: Rollin Dover, MD;  Location: [facility] ENDOSCOPY;  Service: Gastroenterology;  Laterality: N/A;   EYE SURGERY     HERNIA REPAIR     HYSTEROSCOPY     IMPLANTABLE CARDIOVERTER DEFIBRILLATOR (ICD) GENERATOR CHANGE N/A 04/02/2014   Procedure: ICD GENERATOR CHANGE;  Surgeon: Danelle LELON Birmingham, MD;  Location: [facility] CATH LAB;  Service: Cardiovascular;  Laterality: N/A;   INSERT / REPLACE / REMOVE PACEMAKER     LEFT HEART CATHETERIZATION WITH CORONARY ANGIOGRAM N/A 04/02/2012   Procedure: LEFT HEART CATHETERIZATION WITH CORONARY ANGIOGRAM;  Surgeon: Debby JONETTA Como, MD;  Location: [facility] CATH LAB;  Service: Cardiovascular;  Laterality: N/A;   ORIF ANKLE FRACTURE Right 08/12/2019   Procedure: OPEN REDUCTION INTERNAL FIXATION (ORIF) RIGHT TRIMALLEOLAR ANKLE FRACTURE;  Surgeon: Jerri Kay HERO, MD;  Location: [facility] OR;  Service: Orthopedics;  Laterality: Right;   RIGHT HEART CATH N/A 08/03/2022   Procedure: RIGHT HEART CATH;  Surgeon: Cherrie Toribio SAUNDERS, MD;  Location: [facility] INVASIVE CV LAB;  Service: Cardiovascular;  Laterality: N/A;   SAVORY DILATION N/A 11/25/2022   Procedure: SAVORY DILATION;  Surgeon: Rollin Dover, MD;  Location: [facility] ENDOSCOPY;  Service: Gastroenterology;  Laterality: N/A;   TEE WITHOUT CARDIOVERSION N/A 06/23/2022   Procedure: TRANSESOPHAGEAL ECHOCARDIOGRAM (TEE);  Surgeon: Loni Soyla LABOR, MD;  Location: [facility] ENDOSCOPY;  Service: Cardiology;  Laterality: N/A;   TONSILLECTOMY     TUBAL LIGATION      Social:  Lives at home alone.  Independent in ADLs and IADLs.  Usually is mobile with no assistance or a cane but recently using a wheelchair or rollator due to right ankle swelling and pain. PCP: [name], MD Substances: Current 1/2 pack/day smoker, 24-pack-year history.  Occasional alcohol use.  No current or prior drug use.  Family History:  Family History  Problem Relation Age of Onset   Stroke Mother    Seizures Father    Heart disease  Father    Diabetes Sister    Asthma Maternal Aunt        aunts   Asthma Maternal Uncle        uncles   Heart disease Paternal Aunt        aunts   Heart disease Paternal Uncle        uncles   Heart disease Maternal Aunt        aunts   Heart disease Maternal Uncle        uncles   Heart disease Maternal Grandfather    Breast cancer Daughter    Breast cancer Cousin    Asthma Grandchild    Colon cancer Neg Hx    Colon polyps Neg Hx    Esophageal cancer Neg Hx    Kidney disease Neg Hx    Gallbladder disease Neg Hx     Allergies: Allergies as of 04/13/2024 - Review Complete 04/13/2024  Allergen Reaction Noted   Actos [pioglitazone] Other (See Comments) 03/12/2009   Naproxen  Other (See Comments) 02/05/2016   Rosiglitazone Hives 02/11/2016   Metformin  and related  04/18/2023   Tomato Itching 06/16/2021   Hydrocodone -acetaminophen  Itching 08/08/2019   Hydrocortisone Hives and Itching 10/06/2009    Review of Systems: A complete ROS was negative except as per HPI.   OBJECTIVE:   Physical Exam: Blood pressure 134/72, pulse 93, temperature 98.1 F (36.7 C), temperature source Oral, resp. rate 18, height 5' 2 (1.575 m), weight 74.8 kg, SpO2 98%.  Constitutional: Well-appearing elderly female laying in bed. In no acute distress. HENT: Normocephalic, atraumatic,  Eyes: Sclera non-icteric, PERRL, EOM intact Neck:normal atraumatic, no jvd Cardio:Regular rate and rhythm. 2+ bilateral radial pulses.  Diminished dorsalis pedis pulses R>L Pulm:Clear to auscultation bilaterally. Normal work of breathing on room air. MSK: Trace lower extremity edema bilaterally below the knees.  Fluctuant edema around the lateral right malleolus without tenderness, warmth, or overlying erythema/wound Skin:Warm and dry. Neuro:Alert and oriented x3. No focal deficit noted. Psych:Pleasant mood and affect.  Labs: CBC    Component Value Date/Time   WBC 9.9 04/13/2024 1856   RBC 4.85 04/13/2024 1856    HGB 12.9 04/13/2024 1926   HGB 14.1 02/08/2017 1551   HCT 38.0 04/13/2024 1926   HCT 44.5 02/08/2017 1551   PLT 346 04/13/2024 1856   PLT 246 02/08/2017 1551   MCV 81.6 04/13/2024 1856   MCV 87 02/08/2017 1551   MCH 25.4 (L) 04/13/2024 1856   MCHC 31.1 04/13/2024 1856   RDW 15.1 04/13/2024 1856   RDW 16.7 (H) 02/08/2017 1551   LYMPHSABS 2.0 04/13/2024 1856   LYMPHSABS 2.6 02/08/2017 1551   MONOABS 0.8 04/13/2024 1856   EOSABS 0.3 04/13/2024 1856   EOSABS 0.3 02/08/2017 1551   BASOSABS 0.1 04/13/2024 1856   BASOSABS 0.0 02/08/2017 1551     CMP     Component Value Date/Time   NA 140 04/13/2024 1926   NA 142 01/22/2024 1030   K 3.6 04/13/2024 1926   CL 100 04/13/2024 1856   CO2 27 04/13/2024 1856   GLUCOSE 142 (H) 04/13/2024 1856   BUN 35 (H) 04/13/2024 1856   BUN 31 (H) 01/22/2024 1030   CREATININE 2.54 (H) 04/13/2024 1856   CREATININE 0.88 06/16/2016 1338   CALCIUM  9.3 04/13/2024 1856   PROT 7.4 04/13/2024 1856   PROT 6.9 02/08/2017 1551   ALBUMIN  3.5 04/13/2024 1856   ALBUMIN  4.1 02/08/2017 1551   AST 17 04/13/2024 1856   ALT 13 04/13/2024 1856   ALKPHOS 99 04/13/2024 1856   BILITOT 0.5 04/13/2024 1856   BILITOT 0.4 02/08/2017 1551   GFRNONAA 20 (L) 04/13/2024 1856   GFRNONAA 75 02/13/2015 1015   GFRAA 78 07/14/2020 1536   GFRAA 87 02/13/2015 1015    Imaging: DG Chest Port 1 View Result Date: 04/13/2024 CLINICAL DATA:  Dyspnea EXAM: PORTABLE CHEST 1 VIEW COMPARISON:  chest x-ray 12/27/2023 FINDINGS: The heart is enlarged. Left-sided pacemaker is again seen. There is central pulmonary vascular congestion. Cluster of micro nodules seen in the right upper lobe. No pleural effusion or pneumothorax. No acute fractures. IMPRESSION: 1. Cardiomegaly with central pulmonary vascular congestion. 2. Cluster of micro nodules in the right upper lobe, possibly infectious/inflammatory. This can be further evaluated with chest CT. Electronically Signed   By: Greig Pique M.D.    On: 04/13/2024 20:59     EKG: personally reviewed my interpretation is normal sinus rhythm with prolonged QT. Consistent with prior EKG.  ASSESSMENT & PLAN:   Assessment & Plan by Problem: Principal Problem:   Acute on chronic hypoxic respiratory failure (HCC)  ABBEGALE STEHLE is a 73 y.o. female with pertinent PMH of HFrEF with ICD, COPD, CKD stage IV, T2DM, HTN, paroxysmal A-flutter, and tobacco use disorder who presented with progressive dyspnea and is admitted for acute on chronic hypoxic respiratory failure.  Acute on chronic hypoxic respiratory failure HFrEF COPD Overall not completely consistent with heart failure exacerbation or COPD exacerbation.  Wells score for DVT/PE is low and she is on anticoagulation currently however tachycardia may be masked by beta-blocker.  BNP and troponin both low with no EKG changes.  No leukocytosis, fever, or productive cough.  Stable on room air at rest on my exam.  Could be due to some chronic progressive deconditioning worsened by her right ankle swelling causing decreased mobility.  I do not think that she needs IV diuresis, steroids, or further diagnostic workup for DVT/PE.  Last echocardiogram on 12/13/2023 showed mild improvement in LVEF to 35-40% with global hypokinesis, normal RV function but elevated RVSP at 33. - Continue home medications starting tomorrow morning  -Torsemide  80 mg BID, spironolactone  12.5 mg daily, Farxiga  10 mg daily, and oral Ellipta, and as needed albuterol   -Clarifiy bisoprolol  before resuming and replete potassium as needed - Will order echocardiogram however this may be able to be done outpatient - Monitor intake and output - Repeat BMP in the morning  Right upper lobe micronodules Tobacco use disorder Chest x-ray shows a cluster of micronodules in the right upper lobe that could be infectious or inflammatory.  Currently low suspicion for pulmonary infection.  Recommend repeat chest x-ray versus chest CT outpatient in  3 to 4 weeks.  AKI on CKD stage IV Creatinine 2.54 here today and most recently baseline appears around 1.9-2.1.  No reported change in urinary output from the patient and no decreased responsiveness to current dose of torsemide .  Does not appear significantly dry on exam or volume overloaded.  However she has had decreased mobility due to her right ankle and this may have caused decreased oral intake.  Will recommend adequate oral hydration but continue home diuretic as above and get a urinalysis. - UA - Repeat BMP in the morning, if no improvement in creatinine hold furosemide , spironolactone , and Farxiga  - Follow ins and outs  Right ankle swelling Patient has had recurrent issues with right ankle swelling after undergoing ORIF of her right ankle in 2020.  Recently seen in our clinic for similar issue.  Reports that the swelling is now accompanied by some pain in his limited her mobility.  There is fluctuant edema primarily around the right malleolus without tenderness to palpation or signs of infection.  Due to her inability to put weight on it we will get a x-ray. - Right ankle x-ray - PT/OT evaluation  Paroxysmal atrial flutter on anticoagulation Home medications include amiodarone , bisoprolol , and Eliquis .  Reports that she takes Eliquis  3 times daily so we will need reinforcement of this should be twice daily.  Also there is no recent fill history for bisoprolol .  Currently normal sinus rhythm. - Continue home medications with amiodarone  100 mg daily and Eliquis  5 mg twice daily - Clarify bisoprolol  prior to redosing in the morning  T2DM Most recent A1c 7.5 on 03/25/2024.  Currently on Tresiba  5 units daily, Farxiga  10 mg daily, and Ozempic  1 mg weekly. - Continued 5 units insulin  glargine daily  Diet: Normal VTE: DOAC anticoagulation Code: DNR/DNI  Dispo: Admit patient to Observation with expected length of stay less than 2 midnights.  Signed: Fairy Pool, DO  Internal Medicine  Resident, PGY-3 Please contact the on call pager at [phone] for any urgent or emergent needs. 9:30 PM 04/13/2024

## 2024-04-13 NOTE — ED Notes (Signed)
 O2 Sats down to 88% on RA while resting, pt placed on 2L [place], O2 sat 93%

## 2024-04-13 NOTE — Hospital Course (Addendum)
   Acute on chronic hypoxic respiratory failure (home 2L O2 at bedtime) HFrEF (EF 35-40%) COPD  Patient presented with shortness of breath that had been getting worse over the last week. BNP and troponins were low. EKG did not show any acute ischemic change. Patient also did not have leukocytosis, fever or productive cough. By the time patient was admitted, after being on 2-4L of O2  in the ED, patient was on room air. Patient does use 2L O2 at night as her baseline. Patient did not fit volume overload, COPD exacerbation, or ACS picture. Wells score was low for DVT/PE. Shortness of breath likely multifactorial, with lung and heart pathology contributing. We restarted patient on home medications of torsemide  80 mg BID, spironolactone  12.5 mg daily, Farxiga  10 daily. Due to worsening kidney function, we discontinued patient's home torsemide  and spironolactone . Farxiga  was held for one day but added back on. Patient's volume status did improve as far as swelling, but still had some fine crackles at lung bases posteriorly likely due to lung disease. Echo was done in the setting worsening shortness of breath, and showed further reduced EF of 20 to 25% and severely decreased left ventricular function. Cardiology was consulted and gave recommendations as far as GDMT for patient.   AKI on CKD Stage IV  Baseline appears to be 1.9-2.1. Patient's creatinine on admission was 2.54 on admission. We were able to give patient one dose of diuretics before creatinine went up. Likely prerenal etiology, as when torsemide  was discontinued, patient's creatinine normalized. Poor oral intake could also have played a part.   Orthostatic Hypotension  Orthostatic vitals were initially positive, although patient was asymptomatic. Did not give patient fluids in the setting of her heart failure. Ted hose were ordered but unfortunately never obtained for patient. Patient's repeat orthostatic vitals were not positive. Encouraged  patient to have good PO intake.   Paroxysmal atrial flutter on anticoagulation  Patient is on bisoprolol  2.5 mg, amiodarone  100 mg and eliquis  5 mg BID at home. Patient was kept on bisoprolol  2.5 mg and eliquis  5 mg BID while in the hospital. Amiodarone  was discontinued by cardiology due to prolonged Qtc .   Right ankle swelling Patient has had recurrent issues with right ankle swelling after undergoing ORIF of her right ankle in 2020.  Recently seen in our clinic for similar issue.  Reports that the swelling is now accompanied by some pain in his limited her mobility.  There is fluctuant edema around right malleolus without TTP or signs of infection at this time.  Xray with soft tissue swelling but no acute findings. Patient was given voltaren  gel. She reports that she still has some pain when putting weight on that foot. Patient was able to work with physical therapy during hospitalization.   Right upper lobe micronodules Tobacco use disorder Chest x-ray shows a cluster of micronodules in the right upper lobe that could be infectious or inflammatory. Patient was afebrile and did not have any leukocytosis during hospitalization. Recommend repeat chest x-ray versus chest CT outpatient in 3 to 4 weeks. Continue smoking cessation counseling.   T2DM, controlled on insulin  therapy  A1c 7.5 in 03/2024. PTA on Tresiba  5 units daily, Farxiga  10 mg daily and Ozempic  1 mg weekly. Fasting glucose 110. Ozempic  can be resumed outpatient. Patient was kept on tresiba  5 units daily and farxiga  10 mg during

## 2024-04-14 ENCOUNTER — Observation Stay (HOSPITAL_BASED_OUTPATIENT_CLINIC_OR_DEPARTMENT_OTHER)

## 2024-04-14 ENCOUNTER — Encounter (HOSPITAL_COMMUNITY): Payer: Self-pay | Admitting: Internal Medicine

## 2024-04-14 DIAGNOSIS — R0609 Other forms of dyspnea: Secondary | ICD-10-CM | POA: Diagnosis not present

## 2024-04-14 DIAGNOSIS — I130 Hypertensive heart and chronic kidney disease with heart failure and stage 1 through stage 4 chronic kidney disease, or unspecified chronic kidney disease: Secondary | ICD-10-CM | POA: Diagnosis not present

## 2024-04-14 DIAGNOSIS — I480 Paroxysmal atrial fibrillation: Secondary | ICD-10-CM | POA: Diagnosis not present

## 2024-04-14 DIAGNOSIS — R918 Other nonspecific abnormal finding of lung field: Secondary | ICD-10-CM

## 2024-04-14 DIAGNOSIS — I5020 Unspecified systolic (congestive) heart failure: Secondary | ICD-10-CM | POA: Diagnosis not present

## 2024-04-14 DIAGNOSIS — J9621 Acute and chronic respiratory failure with hypoxia: Secondary | ICD-10-CM

## 2024-04-14 DIAGNOSIS — Z794 Long term (current) use of insulin: Secondary | ICD-10-CM | POA: Diagnosis not present

## 2024-04-14 DIAGNOSIS — E119 Type 2 diabetes mellitus without complications: Secondary | ICD-10-CM

## 2024-04-14 DIAGNOSIS — F17210 Nicotine dependence, cigarettes, uncomplicated: Secondary | ICD-10-CM

## 2024-04-14 DIAGNOSIS — Z7985 Long-term (current) use of injectable non-insulin antidiabetic drugs: Secondary | ICD-10-CM

## 2024-04-14 DIAGNOSIS — M25471 Effusion, right ankle: Secondary | ICD-10-CM | POA: Diagnosis not present

## 2024-04-14 DIAGNOSIS — J449 Chronic obstructive pulmonary disease, unspecified: Secondary | ICD-10-CM | POA: Diagnosis not present

## 2024-04-14 DIAGNOSIS — Z7901 Long term (current) use of anticoagulants: Secondary | ICD-10-CM

## 2024-04-14 LAB — BASIC METABOLIC PANEL WITH GFR
Anion gap: 12 (ref 5–15)
BUN: 36 mg/dL — ABNORMAL HIGH (ref 8–23)
CO2: 28 mmol/L (ref 22–32)
Calcium: 9.6 mg/dL (ref 8.9–10.3)
Chloride: 101 mmol/L (ref 98–111)
Creatinine, Ser: 2.57 mg/dL — ABNORMAL HIGH (ref 0.44–1.00)
GFR, Estimated: 20 mL/min — ABNORMAL LOW (ref >60)
Glucose, Bld: 110 mg/dL — ABNORMAL HIGH (ref 70–99)
Potassium: 3.9 mmol/L (ref 3.5–5.1)
Sodium: 141 mmol/L (ref 135–145)

## 2024-04-14 LAB — URINALYSIS, ROUTINE W REFLEX MICROSCOPIC
Bacteria, UA: NONE SEEN
Bilirubin Urine: NEGATIVE
Glucose, UA: 50 mg/dL — AB
Hgb urine dipstick: NEGATIVE
Ketones, ur: NEGATIVE mg/dL
Leukocytes,Ua: NEGATIVE
Nitrite: NEGATIVE
Protein, ur: NEGATIVE mg/dL
Specific Gravity, Urine: 1.015 (ref 1.005–1.030)
pH: 5 (ref 5.0–8.0)

## 2024-04-14 LAB — HIV ANTIBODY (ROUTINE TESTING W REFLEX): HIV Screen 4th Generation wRfx: NONREACTIVE

## 2024-04-14 LAB — ECHOCARDIOGRAM COMPLETE
AR max vel: 1.88 cm2
AV Area VTI: 1.83 cm2
AV Area mean vel: 1.79 cm2
AV Mean grad: 8 mmHg
AV Peak grad: 13.4 mmHg
Ao pk vel: 1.83 m/s
Area-P 1/2: 5.5 cm2
Height: 62 in
S' Lateral: 4.3 cm
Weight: 2640 [oz_av]

## 2024-04-14 LAB — CBC
HCT: 35.3 % — ABNORMAL LOW (ref 36.0–46.0)
Hemoglobin: 10.9 g/dL — ABNORMAL LOW (ref 12.0–15.0)
MCH: 25.3 pg — ABNORMAL LOW (ref 26.0–34.0)
MCHC: 30.9 g/dL (ref 30.0–36.0)
MCV: 81.9 fL (ref 80.0–100.0)
Platelets: 338 K/uL (ref 150–400)
RBC: 4.31 MIL/uL (ref 3.87–5.11)
RDW: 15.1 % (ref 11.5–15.5)
WBC: 8.7 K/uL (ref 4.0–10.5)
nRBC: 0 % (ref 0.0–0.2)

## 2024-04-14 LAB — GLUCOSE, CAPILLARY
Glucose-Capillary: 117 mg/dL — ABNORMAL HIGH (ref 70–99)
Glucose-Capillary: 211 mg/dL — ABNORMAL HIGH (ref 70–99)
Glucose-Capillary: 161 mg/dL — ABNORMAL HIGH (ref 70–99)
Glucose-Capillary: 131 mg/dL — ABNORMAL HIGH (ref 70–99)

## 2024-04-14 MED ORDER — INSULIN ASPART 100 UNIT/ML IJ SOLN
0.0000 [IU] | Freq: Three times a day (TID) | INTRAMUSCULAR | Status: DC
  Administered 2024-04-14: 1 [IU] via SUBCUTANEOUS
  Administered 2024-04-15: 2 [IU] via SUBCUTANEOUS
  Administered 2024-04-16: 3 [IU] via SUBCUTANEOUS
  Administered 2024-04-16 – 2024-04-17 (×3): 1 [IU] via SUBCUTANEOUS
  Administered 2024-04-17: 2 [IU] via SUBCUTANEOUS

## 2024-04-14 MED ORDER — DICLOFENAC SODIUM 1 % EX GEL
4.0000 g | Freq: Four times a day (QID) | CUTANEOUS | Status: DC
  Administered 2024-04-14 – 2024-04-16 (×4): 4 g via TOPICAL
  Filled 2024-04-14: qty 100

## 2024-04-14 MED ORDER — PERFLUTREN LIPID MICROSPHERE
1.0000 mL | INTRAVENOUS | Status: AC | PRN
  Administered 2024-04-14: 2 mL via INTRAVENOUS

## 2024-04-14 NOTE — Evaluation (Signed)
 Occupational Therapy Evaluation Patient Details Name: [name] MRN: [id number] DOB: [birth year] Today's Date: 04/14/2024   History of Present Illness   Pt is a 73 y/o female admitted for acute on chronic hypoxic respiratory failure. PMH significant for COPD Gold stage II with chronic hypoxic respite failure on nocturnal nasal cannula, chronic combined HFpEF and HFrEF, LVEF 20-25%, status post AICD, IIDM, cigarette smoker, HTN, HLD, lipoma, depression.     Clinical Impressions PTA, pt lives alone in first floor apartment, typically Modified Independent with ADLs, household IADLs and mobility (recently using walker due to R ankle discomfort). Pt presents now with minor deficits in endurance, strength and balance. Pt able to use RW for bathroom mobility and manage ADLs with no more than CGA w/o safety concerns. Will continue to follow acutely but anticipate no OT needed upon DC. Pt requesting rolling walker prior to DC for home use to decrease falls and offload painful R ankle.   BP supine: 103/66 BP seated: 91/71 BP standing: 66/54 (unsure of accuracy; pt asymptomatic BP post activity: 104/70  SpO2 98% on RA     If plan is discharge home, recommend the following:   Assistance with cooking/housework;Assist for transportation     Functional Status Assessment   Patient has had a recent decline in their functional status and demonstrates the ability to make significant improvements in function in a reasonable and predictable amount of time.     Equipment Recommendations   Other (comment) (Rolling walker)     Recommendations for Other Services         Precautions/Restrictions   Precautions Precautions: Fall;Other (comment) Precaution/Restrictions Comments: monitor orthostatics, O2 (does not typically wear at home) Restrictions Weight Bearing Restrictions Per Provider Order: No     Mobility Bed Mobility Overal bed mobility: Needs Assistance Bed Mobility: Supine  to Sit, Sit to Supine     Supine to sit: Min assist, HOB elevated Sit to supine: Min assist   General bed mobility comments: Assist to lift trunk to EOB and assist for BLE back to bed    Transfers Overall transfer level: Needs assistance Equipment used: Rolling walker (2 wheels) Transfers: Sit to/from Stand Sit to Stand: Contact guard assist                  Balance Overall balance assessment: Mild deficits observed, not formally tested                                         ADL either performed or assessed with clinical judgement   ADL Overall ADL's : Needs assistance/impaired Eating/Feeding: Independent   Grooming: Supervision/safety;Standing   Upper Body Bathing: Set up;Sitting   Lower Body Bathing: Supervison/ safety;Sitting/lateral leans;Sit to/from stand   Upper Body Dressing : Set up;Sitting   Lower Body Dressing: Supervision/safety;Sitting/lateral leans;Sit to/from stand   Toilet Transfer: Contact guard assist;Ambulation;Rolling walker (2 wheels);Regular Toilet   Toileting- Clothing Manipulation and Hygiene: Supervision/safety;Sit to/from stand;Sitting/lateral lean               Vision Baseline Vision/History: 1 Wears glasses Ability to See in Adequate Light: 0 Adequate Patient Visual Report: No change from baseline Vision Assessment?: No apparent visual deficits     Perception         Praxis         Pertinent Vitals/Pain Pain Assessment Pain Assessment: Faces Faces Pain Scale: Hurts a little bit  Pain Location: R ankle Pain Descriptors / Indicators: Grimacing, Guarding Pain Intervention(s): Monitored during session     Extremity/Trunk Assessment Upper Extremity Assessment Upper Extremity Assessment: Overall WFL for tasks assessed;Right hand dominant   Lower Extremity Assessment Lower Extremity Assessment: Defer to PT evaluation   Cervical / Trunk Assessment Cervical / Trunk Assessment: Normal   Communication  Communication Communication: No apparent difficulties   Cognition Arousal: Alert Behavior During Therapy: WFL for tasks assessed/performed Cognition: No apparent impairments                               Following commands: Intact       Cueing  General Comments   Cueing Techniques: Verbal cues      Exercises     Shoulder Instructions      Home Living Family/patient expects to be discharged to:: Private residence Living Arrangements: Alone Available Help at Discharge: Family;Available PRN/intermittently;Neighbor Type of Home: Apartment Home Access: Stairs to enter [employer] of Steps: 3 Entrance Stairs-Rails: Right Home Layout: One level     Bathroom Shower/Tub: Chief Strategy Officer: Standard     Home Equipment: Cane - single point;Toilet riser;Tub bench;Wheelchair - power;Standard Walker;BSC/3in1          Prior Functioning/Environment Prior Level of Function : Independent/Modified Independent             Mobility Comments: reports using standard walker for mobility in her apartment due to R ankle pain (previous admission, pt reported using rollator for mobility) ADLs Comments: Mod I for ADLs, uses shower chair. Manages basic household IADLs with assist as needed. can drive    OT Problem List: Decreased activity tolerance;Impaired balance (sitting and/or standing);Pain   OT Treatment/Interventions: Self-care/ADL training;Therapeutic exercise;Energy conservation;DME and/or AE instruction;Therapeutic activities;Patient/family education;Balance training      OT Goals(Current goals can be found in the care plan section)   Acute Rehab OT Goals Patient Stated Goal: get a walker like this one OT Goal Formulation: With patient Time For Goal Achievement: 04/28/24 Potential to Achieve Goals: Good ADL Goals Pt Will Perform Lower Body Bathing: with modified independence;sit to/from stand Pt Will Transfer to Toilet: with  modified independence;ambulating Pt/caregiver will Perform Home Exercise Program: Increased strength;Both right and left upper extremity;With theraband;Independently;With written HEP provided Additional ADL Goal #1: Pt to increase standing tolerance > 8 min during ADLs/mobility without need for seated rest break   OT Frequency:  Min 2X/week    Co-evaluation              AM-PAC OT 6 Clicks Daily Activity     Outcome Measure Help from another person eating meals?: None Help from another person taking care of personal grooming?: A Little Help from another person toileting, which includes using toliet, bedpan, or urinal?: A Little Help from another person bathing (including washing, rinsing, drying)?: A Little Help from another person to put on and taking off regular upper body clothing?: A Little Help from another person to put on and taking off regular lower body clothing?: A Little 6 Click Score: 19   End of Session Equipment Utilized During Treatment: Rolling walker (2 wheels) Nurse Communication: Mobility status;Other (comment) (BP readings)  Activity Tolerance: Patient tolerated treatment well Patient left: in bed;with call bell/phone within reach;with bed alarm set  OT Visit Diagnosis: Other abnormalities of gait and mobility (R26.89)  Time: 0800-0828 OT Time Calculation (min): 28 min Charges:  OT General Charges $OT Visit: 1 Visit OT Evaluation $OT Eval Low Complexity: 1 Low OT Treatments $Self Care/Home Management : 8-22 mins  Mliss NOVAK, OTR/L Acute Rehab Services Office: [phone]   Mliss Fish 04/14/2024, 8:41 AM

## 2024-04-14 NOTE — Progress Notes (Signed)
 Orthostatic V/S done. Provider was notified. Please refer to V/S flowsheet. Will continue to monitor.

## 2024-04-14 NOTE — Progress Notes (Signed)
 HD#0 Subjective:  Summary: [name] is a 73 y.o. female with PMH of HFrEF with ICD, COPD, CKD stage IV, T2DM, HTN, paroxysmal A-flutter, and tobacco use disorder  who presents with DOE and admitted for AoC hypoxic respiratory failure.  Overnight Events: No overnight events  Interval History:  Patient evaluated at bedside. States was standing outside restaurant waiting to get in when she felt warm and SOB. States felt the heat triggered it. Still feels SOB on exertion. Chronic swelling of right ankle with some pain on ROM.  Patient reports that she feels her chronic home O2 at night does help her sleep well at night. She has not been using oxygen  during the day. No other complaints at this time.   Objective:  Vital signs in last 24 hours: Vitals:   04/13/24 2325 04/14/24 0356 04/14/24 0400 04/14/24 0420  BP: 119/67 119/66 90/62   Pulse: 87 81 99   Resp: 18 18 18    Temp: 97.8 F (36.6 C) 98.1 F (36.7 C)    TempSrc: Oral Oral    SpO2: 100% 100% 100%   Weight:    74.8 kg  Height:       Supplemental O2: Room Air SpO2: 100 % O2 Flow Rate (L/min): 3 L/min   Physical Exam:  Constitutional: well-appearing  sitting in bed, in no acute distress Cardiovascular: regular rate and rhythm, no m/r/g Pulmonary/Chest: normal work of breathing on room air, bilateral fine crackles heard posteriorly at mid lung and lower bases of lungs.  Abdominal: soft, non-tender, non-distended MSK: Patient has fluctuant edema over right lateral malleolus. This area is not warm to the touch. Patient has lymphedema present on right mid foot to toes. DP pulses palpated bilaterally. Trace to 1+ pitting edema bilaterally in right and left lower extremities.  Neurological: alert  Skin: darkened skin thickening present on back of neck and extensor surface of arms. Rough to the touch.   Filed Weights   04/13/24 1853 04/14/24 0420  Weight: 74.8 kg 74.8 kg     Intake/Output Summary (Last 24 hours) at  04/14/2024 0534 Last data filed at 04/14/2024 0000 Gross per 24 hour  Intake 10 ml  Output --  Net 10 ml   Net IO Since Admission: 10 mL [04/14/24 0534]  Pertinent Labs:    Latest Ref Rng & Units 04/13/2024    7:26 PM 04/13/2024    6:56 PM 12/28/2023    5:31 AM  CBC  WBC 4.0 - 10.5 K/uL  9.9  6.8   Hemoglobin 12.0 - 15.0 g/dL 87.0  87.6  85.9   Hematocrit 36.0 - 46.0 % 38.0  39.6  46.1   Platelets 150 - 400 K/uL  346  227        Latest Ref Rng & Units 04/13/2024    7:26 PM 04/13/2024    6:56 PM 01/22/2024   10:30 AM  CMP  Glucose 70 - 99 mg/dL  857  796   BUN 8 - 23 mg/dL  35  31   Creatinine 9.55 - 1.00 mg/dL  7.45  7.86   Sodium 864 - 145 mmol/L 140  142  142   Potassium 3.5 - 5.1 mmol/L 3.6  3.7  4.3   Chloride 98 - 111 mmol/L  100  103   CO2 22 - 32 mmol/L  27  24   Calcium  8.9 - 10.3 mg/dL  9.3  9.4   Total Protein 6.5 - 8.1 g/dL  7.4  Total Bilirubin 0.0 - 1.2 mg/dL  0.5    Alkaline Phos 38 - 126 U/L  99    AST 15 - 41 U/L  17    ALT 0 - 44 U/L  13      Imaging: DG Ankle Complete Right Result Date: 04/13/2024 CLINICAL DATA:  Right ankle pain, initial encounter EXAM: RIGHT ANKLE - COMPLETE 3+ VIEW COMPARISON:  06/17/2022 FINDINGS: Postsurgical changes are noted in the distal tibia and fibula similar to that seen on the prior exam. No hardware failure is noted. Significant soft tissue swelling about the ankle is noted. No acute fracture or dislocation is seen. Calcaneal spurring is noted. IMPRESSION: Postsurgical changes with soft tissue swelling. No acute abnormality is noted. Electronically Signed   By: Oneil Devonshire M.D.   On: 04/13/2024 22:30   DG Chest Port 1 View Result Date: 04/13/2024 CLINICAL DATA:  Dyspnea EXAM: PORTABLE CHEST 1 VIEW COMPARISON:  chest x-ray 12/27/2023 FINDINGS: The heart is enlarged. Left-sided pacemaker is again seen. There is central pulmonary vascular congestion. Cluster of micro nodules seen in the right upper lobe. No pleural effusion or  pneumothorax. No acute fractures. IMPRESSION: 1. Cardiomegaly with central pulmonary vascular congestion. 2. Cluster of micro nodules in the right upper lobe, possibly infectious/inflammatory. This can be further evaluated with chest CT. Electronically Signed   By: Greig Pique M.D.   On: 04/13/2024 20:59    Assessment/Plan:   Principal Problem:   Acute on chronic hypoxic respiratory failure [facility])   Patient Summary: [name] is a 73 y.o. with a pertinent PMH of HFrEF with ICD, COPD, CKD stage IV, T2DM, HTN, paroxysmal A-flutter, and tobacco use disorder, who presented with DOE and admitted for AoC hypoxic respiratory failure.   Acute on chronic hypoxic respiratory failure (home 2L O2 at bedtime) HFrEF (EF 35-40%) COPD  Improving. Now on RA at rest. Question is heat exhaustion played a role in her SOB given she was standing in the heat/humidity. Could be chronic progressive deconditioning worsened by her R ankle swelling that lead to decreased mobility. Resumed home diuretics but held PM dose given orthostatic vitals today. Will repeat echo today.  -Received torsemide  80 mg this morning will hold evening dose -Hold spironolactone  and Farxiga   -Clarify bisoprolol  tomorrow  -Continue home Anoro inhaler and PRN albuterol   -F/u repeat TTE -Trend BMP -Monitor volume status, I&Os and replete electrolytes as needed -Wean supplemental O2 as tolerated, SpO2 goal 88-92%   AKI on CKD stage IV Scr unchanged at 2.57 from 2.54. Appears BL of 1.9-2.1. No change in UOP and remains adherent to home torsemide . Will hold PM torsemide  tonight and re-assess. UA without proteinuria, ketones or signs of UTI.  -Hold PM torsemide  along with spiro and farxiga   -Trend BMP -Encourage adequate po hydration  -Monitor I&Os   Right ankle swelling Patient has had recurrent issues with right ankle swelling after undergoing ORIF of her right ankle in 2020.  Recently seen in our clinic for similar issue.  Reports  that the swelling is now accompanied by some pain in his limited her mobility.  There is fluctuant edema around right malleolus without TTP or signs of infection at this time.  Xray with soft tissue swelling but no acute findings.  -PT/OT evaluation  -Voltaren  gel PRN -Elevate RLE   Right upper lobe micronodules Tobacco use disorder Chest x-ray shows a cluster of micronodules in the right upper lobe that could be infectious or inflammatory.  Currently low suspicion for  pulmonary infection. She remains afebrile and no leukocytosis. Recommend repeat chest x-ray versus chest CT outpatient in 3 to 4 weeks. Continue smoking cessation counseling.   Paroxysmal atrial flutter on anticoagulation PTA on amiodarone  100 mg, bisoprolol  2.5  mg and eliquis  5 mg BID. In sinus rhythm and normal rate. No recent dispense of bisoprolol .  -Continue home amiodarone  and Eliquis    T2DM, controlled on insulin  therapy  A1c 7.5 in 03/2024. PTA on Tresiba  5 units daily, Farxiga  10 mg daily and Ozempic  1 mg weekly. Fasting glucose 110.  -CBG monitoring -Continue insulin  glargine 5 units daily  -Hold home Farxiga  with renal status   Diet: Carb-Modified IVF: none Abx: none VTE: DOAC Code: DNR/DNI PT/OT recs: Pending  Family Update: no family at bedside this AM  Dispo: Anticipated discharge to Home in 1-2 days pending echo and medical stability.   [facility]  Internal Medicine Resident PGY-1 Please contact the on call pager at [phone].

## 2024-04-14 NOTE — Plan of Care (Signed)

## 2024-04-14 NOTE — Evaluation (Signed)
 Physical Therapy Evaluation Patient Details Name: [name] MRN: [id number] DOB: [birth year] Today's Date: 04/14/2024  History of Present Illness  Pt is a 73 y/o female admitted for acute on chronic hypoxic respiratory failure. was attendign a showing of [employer] at the [employer] when she felt short of breath; PMH significant for COPD Gold stage II with chronic hypoxic respite failure on nocturnal nasal cannula, chronic combined HFpEF and HFrEF, LVEF 20-25%, status post AICD, IIDM, cigarette smoker, HTN, HLD, lipoma, depression.  Clinical Impression   Pt admitted with above diagnosis. Lives at home alone, in a single-level home with a few steps to enter; Prior to admission, pt was able to manage independently, typically walking without an assistive device; Presents to PT with generalized weakness and fatigue, as well as an initial SBP drop from sitting to standing of >20 mmHg, coupled with the symptom of feeling very fatigued (didn't mention lightheadedness or dizziness);  Overall though, she is moving well, and this PT anticipates good progress; Pt currently with functional limitations due to the deficits listed below (see PT Problem List). Pt will benefit from skilled PT to increase their independence and safety with mobility to allow discharge to the venue listed below.           If plan is discharge home, recommend the following: Assist for transportation   Can travel by private vehicle        Equipment Recommendations Rollator (4 wheels) (Pt may already have one?)  Recommendations for Other Services       Functional Status Assessment Patient has had a recent decline in their functional status and demonstrates the ability to make significant improvements in function in a reasonable and predictable amount of time.     Precautions / Restrictions Precautions Precautions: Fall;Other (comment) Precaution/Restrictions Comments: monitor orthostatics, O2 (does not typically  wear at home) Restrictions Weight Bearing Restrictions Per Provider Order: No      Mobility  Bed Mobility               General bed mobility comments: Sitting EOB upon arrival    Transfers Overall transfer level: Needs assistance Equipment used: Rolling walker (2 wheels), None Transfers: Sit to/from Stand Sit to Stand: Contact guard assist           General transfer comment: stood from EOB without difficulty    Ambulation/Gait               General Gait Details: Ultimately, pt was quite fatigued after getting serial BPs, and we opted to hold progressive amb until next session  Stairs            Wheelchair Mobility     Tilt Bed    Modified Rankin (Stroke Patients Only)       Balance Overall balance assessment: Mild deficits observed, not formally tested                                           Pertinent Vitals/Pain Pain Assessment Pain Assessment: No/denies pain Pain Location: Back itching Pain Descriptors / Indicators:  (Itching) Pain Intervention(s): Other (comment) (Assisted pt in applying cream to her back)    Home Living Family/patient expects to be discharged to:: Private residence Living Arrangements: Alone Available Help at Discharge: Family;Available PRN/intermittently;Neighbor Type of Home: Apartment Home Access: Stairs to enter Entrance Stairs-Rails: Right Entrance Stairs-Number of Steps: 3  Home Layout: One level Home Equipment: Cane - single point;Toilet riser;Tub bench;Wheelchair - power;Standard Walker;BSC/3in1      Prior Function Prior Level of Function : Independent/Modified Independent             Mobility Comments: reports using standard walker for mobility in her apartment due to R ankle pain (previous admission, pt reported using rollator for mobility) ADLs Comments: Mod I for ADLs, uses shower chair. Manages basic household IADLs with assist as needed. can drive     Extremity/Trunk  Assessment   Upper Extremity Assessment Upper Extremity Assessment: Defer to OT evaluation    Lower Extremity Assessment Lower Extremity Assessment: Generalized weakness (after a few minutes standing)    Cervical / Trunk Assessment Cervical / Trunk Assessment: Normal  Communication   Communication Communication: No apparent difficulties    Cognition Arousal: Alert Behavior During Therapy: WFL for tasks assessed/performed                             Following commands: Intact       Cueing Cueing Techniques: Verbal cues     General Comments General comments (skin integrity, edema, etc.):   04/14/24 1540 04/14/24 1545  Orthostatic Sitting  BP- Sitting 128/76 114/78  Pulse- Sitting 86 91  Orthostatic Standing at 0 minutes  BP- Standing at 0 minutes 90/74 92/71  Pulse- Standing at 0 minutes 102 110  Orthostatic Standing at 3 minutes  BP- Standing at 3 minutes  --  108/77  Pulse- Standing at 3 minutes  --  105       Exercises     Assessment/Plan    PT Assessment Patient needs continued PT services  PT Problem List Decreased strength;Decreased activity tolerance;Cardiopulmonary status limiting activity       PT Treatment Interventions DME instruction;Gait training;Stair training;Functional mobility training;Therapeutic activities;Therapeutic exercise;Balance training;Patient/family education    PT Goals (Current goals can be found in the Care Plan section)  Acute Rehab PT Goals Patient Stated Goal: Did not specifically state; would like to see teh show she missed PT Goal Formulation: With patient Time For Goal Achievement: 04/28/24 Potential to Achieve Goals: Good    Frequency Min 2X/week     Co-evaluation               AM-PAC PT 6 Clicks Mobility  Outcome Measure Help needed turning from your back to your side while in a flat bed without using bedrails?: None Help needed moving from lying on your back to sitting on the side of a flat  bed without using bedrails?: None Help needed moving to and from a bed to a chair (including a wheelchair)?: None Help needed standing up from a chair using your arms (e.g., wheelchair or bedside chair)?: A Little Help needed to walk in hospital room?: A Little Help needed climbing 3-5 steps with a railing? : A Lot 6 Click Score: 20    End of Session Equipment Utilized During Treatment: Gait belt;Other (comment) (vitals machine) Activity Tolerance: Patient tolerated treatment well;Other (comment) (noted incr fatigue with initial stand) Patient left: in bed;with call bell/phone within reach Nurse Communication: Mobility status PT Visit Diagnosis: Unsteadiness on feet (R26.81);Other abnormalities of gait and mobility (R26.89)    Time: 8475-8458 PT Time Calculation (min) (ACUTE ONLY): 17 min   Charges:   PT Evaluation $PT Eval Low Complexity: 1 Low   PT General Charges $$ ACUTE PT VISIT: 1 Visit  Silvano Currier, PT  Acute Rehabilitation Services Office [phone] Secure Chat welcomed   Silvano VEAR Currier 04/14/2024, 4:10 PM

## 2024-04-14 NOTE — Progress Notes (Signed)
 Echocardiogram 2D Echocardiogram has been performed.  Laurajean Hosek N Marcquis Ridlon,RDCS 04/14/2024, 2:50 PM

## 2024-04-14 NOTE — Care Management Obs Status (Signed)
 MEDICARE OBSERVATION STATUS NOTIFICATION   Patient Details  Name: [name] MRN: [id number] Date of Birth: [birth year]   Medicare Observation Status Notification Given:  Yes    Charlann Rayfield Hurst, RN 04/14/2024, 3:19 PM

## 2024-04-15 DIAGNOSIS — R9431 Abnormal electrocardiogram [ECG] [EKG]: Secondary | ICD-10-CM | POA: Diagnosis not present

## 2024-04-15 DIAGNOSIS — R918 Other nonspecific abnormal finding of lung field: Secondary | ICD-10-CM | POA: Diagnosis not present

## 2024-04-15 DIAGNOSIS — N184 Chronic kidney disease, stage 4 (severe): Secondary | ICD-10-CM | POA: Diagnosis not present

## 2024-04-15 DIAGNOSIS — M25471 Effusion, right ankle: Secondary | ICD-10-CM | POA: Diagnosis not present

## 2024-04-15 DIAGNOSIS — J9621 Acute and chronic respiratory failure with hypoxia: Secondary | ICD-10-CM | POA: Diagnosis not present

## 2024-04-15 DIAGNOSIS — J449 Chronic obstructive pulmonary disease, unspecified: Secondary | ICD-10-CM | POA: Diagnosis not present

## 2024-04-15 DIAGNOSIS — N179 Acute kidney failure, unspecified: Secondary | ICD-10-CM

## 2024-04-15 DIAGNOSIS — I5042 Chronic combined systolic (congestive) and diastolic (congestive) heart failure: Principal | ICD-10-CM

## 2024-04-15 DIAGNOSIS — I480 Paroxysmal atrial fibrillation: Secondary | ICD-10-CM

## 2024-04-15 DIAGNOSIS — I5020 Unspecified systolic (congestive) heart failure: Secondary | ICD-10-CM | POA: Diagnosis not present

## 2024-04-15 DIAGNOSIS — I130 Hypertensive heart and chronic kidney disease with heart failure and stage 1 through stage 4 chronic kidney disease, or unspecified chronic kidney disease: Secondary | ICD-10-CM | POA: Diagnosis not present

## 2024-04-15 LAB — BASIC METABOLIC PANEL WITH GFR
Anion gap: 10 (ref 5–15)
Anion gap: 12 (ref 5–15)
BUN: 42 mg/dL — ABNORMAL HIGH (ref 8–23)
BUN: 42 mg/dL — ABNORMAL HIGH (ref 8–23)
CO2: 30 mmol/L (ref 22–32)
CO2: 30 mmol/L (ref 22–32)
Calcium: 9.3 mg/dL (ref 8.9–10.3)
Calcium: 9.1 mg/dL (ref 8.9–10.3)
Chloride: 99 mmol/L (ref 98–111)
Chloride: 96 mmol/L — ABNORMAL LOW (ref 98–111)
Creatinine, Ser: 2.5 mg/dL — ABNORMAL HIGH (ref 0.44–1.00)
Creatinine, Ser: 2.53 mg/dL — ABNORMAL HIGH (ref 0.44–1.00)
GFR, Estimated: 20 mL/min — ABNORMAL LOW (ref >60)
GFR, Estimated: 20 mL/min — ABNORMAL LOW (ref >60)
Glucose, Bld: 125 mg/dL — ABNORMAL HIGH (ref 70–99)
Glucose, Bld: 159 mg/dL — ABNORMAL HIGH (ref 70–99)
Potassium: 4.2 mmol/L (ref 3.5–5.1)
Potassium: 4 mmol/L (ref 3.5–5.1)
Sodium: 139 mmol/L (ref 135–145)
Sodium: 138 mmol/L (ref 135–145)

## 2024-04-15 LAB — CBC
HCT: 35.6 % — ABNORMAL LOW (ref 36.0–46.0)
Hemoglobin: 11.2 g/dL — ABNORMAL LOW (ref 12.0–15.0)
MCH: 25.8 pg — ABNORMAL LOW (ref 26.0–34.0)
MCHC: 31.5 g/dL (ref 30.0–36.0)
MCV: 82 fL (ref 80.0–100.0)
Platelets: 335 K/uL (ref 150–400)
RBC: 4.34 MIL/uL (ref 3.87–5.11)
RDW: 14.9 % (ref 11.5–15.5)
WBC: 8.7 K/uL (ref 4.0–10.5)
nRBC: 0 % (ref 0.0–0.2)

## 2024-04-15 LAB — GLUCOSE, CAPILLARY
Glucose-Capillary: 125 mg/dL — ABNORMAL HIGH (ref 70–99)
Glucose-Capillary: 205 mg/dL — ABNORMAL HIGH (ref 70–99)
Glucose-Capillary: 146 mg/dL — ABNORMAL HIGH (ref 70–99)
Glucose-Capillary: 200 mg/dL — ABNORMAL HIGH (ref 70–99)

## 2024-04-15 NOTE — Consult Note (Signed)
 Cardiology Consultation   Patient ID: [name] MRN: [id number]; DOB: 1950/11/16  Admit date: 04/13/2024 Date of Consult: 04/15/2024  PCP:  [name], MD   [facility] Providers Cardiologist:  None  Electrophysiologist:  Danelle Birmingham, MD  Advanced Heart Failure:  Toribio Fuel, MD       Patient Profile: [name] is a 73 y.o. female with a hx of chronic combined heart failure, COPD, hypertension, chronic respiratory failure, T2DM  who is being seen 04/15/2024 for the evaluation of heart failure at the request of Dr. Trudy.  History of Present Illness: [name] is a 73 year old female with history of chronic combined heart failure, COPD, hypertension, chronic respiratory failure, T2DM who we are consulted for evaluation of acute on chronic heart failure.  She was admitted 06/2022 with right lower extremity cellulitis causing sepsis, TEE showed LVEF 30%.  Subsequently admitted the following month with acute on chronic heart failure, diuresed with IV Lasix .  Underwent right heart cath that admission which showed low filling pressures and CI 2.5.  She follows with Dr. Bensimhon, last seen in advanced heart failure clinic 01/2024.  She had syncopal episode 12/2023 due to hypotension, her BiDil  and lisinopril  were discontinued.  She was continued on Farxiga , spironolactone , bisoprolol .  She presented with worsening dyspnea on exertion.  Initial vitals in ED notable for BP 119/66, pulse 81, SpO2 100% on 3 L.  Labs notable for creatinine 2.5 (increased from 2.1), BNP 22, troponin 15 > 15, hemoglobin 12.3.  Echocardiogram yesterday showed EF 20 to 25%, moderately reduced RV function, mild MR, mild to moderate TR, suspected bicuspid aortic valve with mild AI, RAP 3.   Past Medical History:  Diagnosis Date   Acute metabolic encephalopathy 07/26/2022   ATN (acute tubular necrosis) (HCC) 07/15/2014   Automatic implantable cardioverter-defibrillator in situ    Automatic  implantable cardioverter-defibrillator in situ 05/04/2007   Qualifier: Diagnosis of  By: [place]     Blood transfusion without reported diagnosis    Cardiac defibrillator in situ    Atlas II VR (SJM) implanted by Dr Birmingham   CHF (congestive heart failure) (HCC)    Chronic combined systolic and diastolic heart failure (HCC)    a. EF 35-40% in past;  b. Echo 7/13:  EF 45-50%, Gr 2 diast dysfn, mild AI, mild MAC, trivial MR, mild LAE, PASP 47.   Chronic ulcer of leg (HCC)    04-09-15 resolved-not a problem.   Colon polyps 04/12/2013   Rectosigmoid polyp   COPD (chronic obstructive pulmonary disease) (HCC)    O2 at night   Depression    Diabetes mellitus    Diabetes mellitus, type 2 (HCC) 04/03/2012   HIGH RISK FEET.. Please have patient take shoes and socks off every visit for visual foot inspection.     Eczema    Elevated alkaline phosphatase level    GGT and 5'nucleotidase 8/13 normal   Health care maintenance 01/22/2013   Surgically absent cervix- no pap needed (Path report 07/2000: uterine body with attached bilateral  adnexa and separate cervix.)   History of oxygen  administration    oxygen  @ 2 l/m nasally bedtime 24/7   HTN (hypertension)    Hx of cardiac cath    a. LHC 2003 normal;  b. LHC 6/13:  Mild calcification in the LM, o/w normal coronary arteries, EF 45%.    Hyperkalemia 08/08/2017   Hyperlipidemia    Elevated triglyceride in 2019   Hyperlipidemia associated with type 2  diabetes mellitus (HCC) 05/25/2007   Qualifier: Diagnosis of  By: Gladis FNP, [place]ked[place]     Hyperthyroidism, subclinical    HYPERTHYROIDISM, SUBCLINICAL 05/06/2009   Qualifier: Diagnosis of  By: Rolan Hough     Implantable cardioverter-defibrillator (ICD) generator end of life    Lipoma    Need for hepatitis C screening test 04/25/2019   NICM (nonischemic cardiomyopathy) (HCC)    Obesity    On home oxygen  therapy    2L; 24/7 (10/11/2016)   Post menopausal syndrome    Shortness of breath  07/14/2017   Skin rash 07/12/2018   Sleep apnea    pt denies 04/12/2013    Past Surgical History:  Procedure Laterality Date   ABDOMINAL HYSTERECTOMY     CARDIAC CATHETERIZATION     CARDIAC CATHETERIZATION N/A 06/21/2016   Procedure: Left Heart Cath and Coronary Angiography;  Surgeon: Ozell Fell, MD;  Location: [facility] INVASIVE CV LAB;  Service: Cardiovascular;  Laterality: N/A;   CARDIAC DEFIBRILLATOR PLACEMENT  05/04/2007   SJM Atlas II VR ICD   CARDIAC DEFIBRILLATOR PLACEMENT     CATARACT EXTRACTION     od   COLONOSCOPY N/A 04/12/2013   Procedure: COLONOSCOPY;  Surgeon: Belvie JONETTA Just, MD;  Location: [facility] ENDOSCOPY;  Service: Endoscopy;  Laterality: N/A;  pt.has defibrilator   COLONOSCOPY N/A 04/16/2015   Procedure: COLONOSCOPY;  Surgeon: Toribio SHAUNNA Cedar, MD;  Location: [facility] ENDOSCOPY;  Service: Endoscopy;  Laterality: N/A;   COLONOSCOPY WITH PROPOFOL  N/A 07/21/2023   Procedure: COLONOSCOPY WITH PROPOFOL ;  Surgeon: Just Belvie, MD;  Location: [facility] ENDOSCOPY;  Service: Gastroenterology;  Laterality: N/A;   ESOPHAGOGASTRODUODENOSCOPY N/A 04/16/2015   Procedure: ESOPHAGOGASTRODUODENOSCOPY (EGD);  Surgeon: Toribio SHAUNNA Cedar, MD;  Location: [facility];  Service: Endoscopy;  Laterality: N/A;   ESOPHAGOGASTRODUODENOSCOPY (EGD) WITH PROPOFOL  N/A 11/25/2022   Procedure: ESOPHAGOGASTRODUODENOSCOPY (EGD) WITH PROPOFOL ;  Surgeon: Just Belvie, MD;  Location: [facility];  Service: Gastroenterology;  Laterality: N/A;   EYE SURGERY     HERNIA REPAIR     HYSTEROSCOPY     IMPLANTABLE CARDIOVERTER DEFIBRILLATOR (ICD) GENERATOR CHANGE N/A 04/02/2014   Procedure: ICD GENERATOR CHANGE;  Surgeon: Danelle LELON Birmingham, MD;  Location: [facility] CATH LAB;  Service: Cardiovascular;  Laterality: N/A;   INSERT / REPLACE / REMOVE PACEMAKER     LEFT HEART CATHETERIZATION WITH CORONARY ANGIOGRAM N/A 04/02/2012   Procedure: LEFT HEART CATHETERIZATION WITH CORONARY ANGIOGRAM;  Surgeon: Debby JONETTA Como, MD;  Location: [facility] CATH LAB;   Service: Cardiovascular;  Laterality: N/A;   ORIF ANKLE FRACTURE Right 08/12/2019   Procedure: OPEN REDUCTION INTERNAL FIXATION (ORIF) RIGHT TRIMALLEOLAR ANKLE FRACTURE;  Surgeon: Jerri Kay HERO, MD;  Location: [facility] OR;  Service: Orthopedics;  Laterality: Right;   RIGHT HEART CATH N/A 08/03/2022   Procedure: RIGHT HEART CATH;  Surgeon: Cherrie Toribio SAUNDERS, MD;  Location: [facility] INVASIVE CV LAB;  Service: Cardiovascular;  Laterality: N/A;   SAVORY DILATION N/A 11/25/2022   Procedure: SAVORY DILATION;  Surgeon: Just Belvie, MD;  Location: [facility] ENDOSCOPY;  Service: Gastroenterology;  Laterality: N/A;   TEE WITHOUT CARDIOVERSION N/A 06/23/2022   Procedure: TRANSESOPHAGEAL ECHOCARDIOGRAM (TEE);  Surgeon: Loni Soyla LABOR, MD;  Location: [facility] ENDOSCOPY;  Service: Cardiology;  Laterality: N/A;   TONSILLECTOMY     TUBAL LIGATION        Scheduled Meds:  amiodarone   100 mg Oral Daily   apixaban   5 mg Oral BID   aspirin  EC  81 mg Oral Daily   atorvastatin   80 mg Oral QHS  calcium -vitamin D   1 tablet Oral BID   cetirizine   10 mg Oral QHS   diclofenac  Sodium  4 g Topical QID   insulin  aspart  0-6 Units Subcutaneous TID WC   insulin  glargine-yfgn  5 Units Subcutaneous Daily   umeclidinium-vilanterol  1 puff Inhalation Daily   Continuous Infusions:  PRN Meds: albuterol   Allergies:    Allergies  Allergen Reactions   Actos [Pioglitazone] Other (See Comments)    congestive heart failure    Naproxen  Other (See Comments)    500mg  dose made her sleep for two days   Rosiglitazone Hives   Metformin  And Related     Caused kidney problems    Tomato Itching   Hydrocodone -Acetaminophen  Itching   Hydrocortisone Hives and Itching    Social History:   Social History   Socioeconomic History   Marital status: Significant Other    Spouse name: Not on file   Number of children: 3   Years of education: 11   Highest education level: 11th grade  Occupational History   Occupation: disabled  Tobacco Use    Smoking status: Every Day    Current packs/day: 0.50    Average packs/day: 0.5 packs/day for 45.0 years (22.5 ttl pk-yrs)    Types: Cigarettes   Smokeless tobacco: Never   Tobacco comments:    currently smoking  10 cigs per day  Vaping Use   Vaping status: Never Used  Substance and Sexual Activity   Alcohol use: Not Currently    Alcohol/week: 3.0 standard drinks of alcohol    Types: 3 Shots of liquor per week   Drug use: No   Sexual activity: Yes    Partners: Male    Birth control/protection: Surgical  Other Topics Concern   Not on file  Social History Narrative   ** Merged History Encounter **       Married   Patient enjoys going to [employer], church and spending time with family   Social Drivers of Corporate investment banker Strain: Low Risk  (01/09/2024)   Overall Financial Resource Strain (CARDIA)    Difficulty of Paying Living Expenses: Not hard at all  Food Insecurity: No Food Insecurity (04/14/2024)   Hunger Vital Sign    Worried About Running Out of Food in the Last Year: Never true    Ran Out of Food in the Last Year: Never true  Transportation Needs: No Transportation Needs (04/14/2024)   PRAPARE - Administrator, Civil Service (Medical): No    Lack of Transportation (Non-Medical): No  Physical Activity: Inactive (01/09/2024)   Exercise Vital Sign    Days of Exercise per Week: 0 days    Minutes of Exercise per Session: 0 min  Stress: No Stress Concern Present (01/09/2024)   [employer] of Occupational Health - Occupational Stress Questionnaire    Feeling of Stress : Not at all  Social Connections: Moderately Integrated (04/14/2024)   Social Connection and Isolation Panel    Frequency of Communication with Friends and Family: More than three times a week    Frequency of Social Gatherings with Friends and Family: More than three times a week    Attends Religious Services: More than 4 times per year    Active Member of [employer] or Organizations: Yes     Attends Banker Meetings: More than 4 times per year    Marital Status: Widowed  Intimate Partner Violence: Not At Risk (04/14/2024)   Humiliation, Afraid, Rape, and  Kick questionnaire    Fear of Current or Ex-Partner: No    Emotionally Abused: No    Physically Abused: No    Sexually Abused: No    Family History:   Family History  Problem Relation Age of Onset   Stroke Mother    Seizures Father    Heart disease Father    Diabetes Sister    Asthma Maternal Aunt        aunts   Asthma Maternal Uncle        uncles   Heart disease Paternal Aunt        aunts   Heart disease Paternal Uncle        uncles   Heart disease Maternal Aunt        aunts   Heart disease Maternal Uncle        uncles   Heart disease Maternal Grandfather    Breast cancer Daughter    Breast cancer Cousin    Asthma Grandchild    Colon cancer Neg Hx    Colon polyps Neg Hx    Esophageal cancer Neg Hx    Kidney disease Neg Hx    Gallbladder disease Neg Hx      ROS:  Please see the history of present illness.   All other ROS reviewed and negative.     Physical Exam/Data: Vitals:   04/15/24 0501 04/15/24 0748 04/15/24 0827 04/15/24 1135  BP: 111/60  114/69 125/60  Pulse: 84  88 83  Resp: 18  18 16   Temp: 97.7 F (36.5 C)  97.8 F (36.6 C) (!) 97.3 F (36.3 C)  TempSrc: Oral     SpO2: 100% 99% 99% 98%  Weight:      Height:        Intake/Output Summary (Last 24 hours) at 04/15/2024 1602 Last data filed at 04/15/2024 0548 Gross per 24 hour  Intake 118 ml  Output 600 ml  Net -482 ml      04/15/2024    1:29 AM 04/14/2024    4:20 AM 04/13/2024    6:53 PM  Last 3 Weights  Weight (lbs) 165 lb 165 lb 165 lb  Weight (kg) 74.844 kg 74.844 kg 74.844 kg     Body mass index is 30.18 kg/m.  General:  in no acute distress HEENT: normal Neck: no JVD Vascular: No carotid bruits Cardiac:  RRR; no murmur  Lungs:  clear to auscultation bilaterally Abd: soft, nontender Ext: no  edema Musculoskeletal:  No deformities Skin: warm and dry  Neuro:   no focal abnormalities noted Psych:  Normal affect   EKG:  The EKG was personally reviewed and demonstrates:  Normal sinus rhythm, rate 94, QTc 547 Telemetry:  Telemetry was personally reviewed and demonstrates:  NSR  Relevant CV Studies:   Laboratory Data: High Sensitivity Troponin:   Recent Labs  Lab 04/13/24 1856 04/13/24 2100  TROPONINIHS 15 15     Chemistry Recent Labs  Lab 04/13/24 1856 04/13/24 1926 04/14/24 0541 04/15/24 0413  NA 142 140 141 139  K 3.7 3.6 3.9 4.2  CL 100  --  101 99  CO2 27  --  28 30  GLUCOSE 142*  --  110* 125*  BUN 35*  --  36* 42*  CREATININE 2.54*  --  2.57* 2.50*  CALCIUM  9.3  --  9.6 9.3  MG 1.8  --   --   --   GFRNONAA 20*  --  20* 20*  ANIONGAP 15  --  12 10    Recent Labs  Lab 04/13/24 1856  PROT 7.4  ALBUMIN  3.5  AST 17  ALT 13  ALKPHOS 99  BILITOT 0.5   Lipids No results for input(s): CHOL, TRIG, HDL, LABVLDL, LDLCALC, CHOLHDL in the last 168 hours.  Hematology Recent Labs  Lab 04/13/24 1856 04/13/24 1926 04/14/24 0541 04/15/24 0413  WBC 9.9  --  8.7 8.7  RBC 4.85  --  4.31 4.34  HGB 12.3 12.9 10.9* 11.2*  HCT 39.6 38.0 35.3* 35.6*  MCV 81.6  --  81.9 82.0  MCH 25.4*  --  25.3* 25.8*  MCHC 31.1  --  30.9 31.5  RDW 15.1  --  15.1 14.9  PLT 346  --  338 335   Thyroid  No results for input(s): TSH, FREET4 in the last 168 hours.  BNP Recent Labs  Lab 04/13/24 1856  BNP 21.7    DDimer No results for input(s): DDIMER in the last 168 hours.  Radiology/Studies:  ECHOCARDIOGRAM COMPLETE Result Date: 04/14/2024    ECHOCARDIOGRAM REPORT   Patient Name:   [name] Date of Exam: 04/14/2024 Medical Rec #:  [id number]    Height:       62.0 in Accession #:    [id number]   Weight:       165.0 lb Date of Birth:  [birth year]   BSA:          1.762 m Patient Age:    72 years     BP:           90/62 mmHg Patient Gender: F            HR:            87 bpm. Exam Location:  Inpatient Procedure: 2D Echo, 3D Echo, Color Doppler, Cardiac Doppler and Strain Analysis            (Both Spectral and Color Flow Doppler were utilized during            procedure). Indications:    Dyspnea  History:        Patient has prior history of Echocardiogram examinations, most                 recent 12/13/2023. CHF and Cardiomyopathy, Defibrillator, COPD,                 Signs/Symptoms:Shortness of Breath; Risk Factors:Hypertension,                 Diabetes, Dyslipidemia, Current Smoker and Sleep Apnea.  Sonographer:    Logan Shove RDCS Referring Phys: [id number] JULIE ANNE WILLIAMS IMPRESSIONS  1. Left ventricular ejection fraction, by estimation, is 20 to 25%. The left ventricle has severely decreased function. The left ventricle demonstrates global hypokinesis. There is mild left ventricular hypertrophy. Left ventricular diastolic parameters  are indeterminate.  2. Right ventricular systolic function is moderately reduced. The right ventricular size is normal.  3. Mild mitral valve regurgitation.  4. Tricuspid valve regurgitation is mild to moderate.  5. AV may be functionally bicuspid. The aortic valve is bicuspid. Aortic valve regurgitation is mild.  6. The inferior vena cava is normal in size with greater than 50% respiratory variability, suggesting right atrial pressure of 3 mmHg. FINDINGS  Left Ventricle: Left ventricular ejection fraction, by estimation, is 20 to 25%. The left ventricle has severely decreased function. The left ventricle demonstrates global hypokinesis. The left ventricular internal cavity size was normal in size. There is mild left  ventricular hypertrophy. Left ventricular diastolic parameters are indeterminate. Right Ventricle: The right ventricular size is normal. Right vetricular wall thickness was not assessed. Right ventricular systolic function is moderately reduced. Left Atrium: Left atrial size was normal in size. Right Atrium: Right atrial size was  normal in size. Pericardium: Trivial pericardial effusion is present. Mitral Valve: There is mild thickening of the mitral valve leaflet(s). Mild to moderate mitral annular calcification. Mild mitral valve regurgitation. MV peak gradient, 7.4 mmHg. The mean mitral valve gradient is 2.0 mmHg. Tricuspid Valve: The tricuspid valve is normal in structure. Tricuspid valve regurgitation is mild to moderate. Aortic Valve: AV may be functionally bicuspid. The aortic valve is bicuspid. Aortic valve regurgitation is mild. Aortic valve mean gradient measures 8.0 mmHg. Aortic valve peak gradient measures 13.4 mmHg. Aortic valve area, by VTI measures 1.83 cm. Pulmonic Valve: The pulmonic valve was normal in structure. Pulmonic valve regurgitation is not visualized. Aorta: The aortic root and ascending aorta are structurally normal, with no evidence of dilitation. Venous: The inferior vena cava is normal in size with greater than 50% respiratory variability, suggesting right atrial pressure of 3 mmHg. IAS/Shunts: No atrial level shunt detected by color flow Doppler.  LEFT VENTRICLE PLAX 2D LVIDd:         4.90 cm   Diastology LVIDs:         4.30 cm   LV e' medial:    3.48 cm/s LV PW:         1.10 cm   LV E/e' medial:  35.1 LV IVS:        1.20 cm   LV e' lateral:   4.13 cm/s LVOT diam:     2.30 cm   LV E/e' lateral: 29.5 LV SV:         69 LV SV Index:   39 LVOT Area:     4.15 cm                           3D Volume EF:                          3D EF:        22 %                          LV EDV:       141 ml                          LV ESV:       110 ml                          LV SV:        31 ml RIGHT VENTRICLE            IVC RV Basal diam:  2.90 cm    IVC diam: 1.30 cm RV S prime:     7.62 cm/s TAPSE (M-mode): 1.7 cm LEFT ATRIUM           Index        RIGHT ATRIUM           Index LA diam:      4.60 cm 2.61 cm/m   RA Area:     11.20 cm LA Vol (A2C): 49.8 ml 28.27 ml/m  RA Volume:  23.90 ml  13.57 ml/m LA Vol (A4C): 52.2 ml  29.63 ml/m  AORTIC VALVE AV Area (Vmax):    1.88 cm AV Area (Vmean):   1.79 cm AV Area (VTI):     1.83 cm AV Vmax:           183.00 cm/s AV Vmean:          134.000 cm/s AV VTI:            0.380 m AV Peak Grad:      13.4 mmHg AV Mean Grad:      8.0 mmHg LVOT Vmax:         82.80 cm/s LVOT Vmean:        57.600 cm/s LVOT VTI:          0.167 m LVOT/AV VTI ratio: 0.44  AORTA Ao Root diam: 3.20 cm Ao Asc diam:  3.30 cm MITRAL VALVE MV Area (PHT): 5.50 cm     SHUNTS MV Peak grad:  7.4 mmHg     Systemic VTI:  0.17 m MV Mean grad:  2.0 mmHg     Systemic Diam: 2.30 cm MV Vmax:       1.36 m/s MV Vmean:      70.5 cm/s MV Decel Time: 138 msec MV E velocity: 122.00 cm/s Vina Gull MD Electronically signed by Vina Gull MD Signature Date/Time: 04/14/2024/5:17:26 PM    Final    DG Ankle Complete Right Result Date: 04/13/2024 CLINICAL DATA:  Right ankle pain, initial encounter EXAM: RIGHT ANKLE - COMPLETE 3+ VIEW COMPARISON:  06/17/2022 FINDINGS: Postsurgical changes are noted in the distal tibia and fibula similar to that seen on the prior exam. No hardware failure is noted. Significant soft tissue swelling about the ankle is noted. No acute fracture or dislocation is seen. Calcaneal spurring is noted. IMPRESSION: Postsurgical changes with soft tissue swelling. No acute abnormality is noted. Electronically Signed   By: Oneil Devonshire M.D.   On: 04/13/2024 22:30   DG Chest Port 1 View Result Date: 04/13/2024 CLINICAL DATA:  Dyspnea EXAM: PORTABLE CHEST 1 VIEW COMPARISON:  chest x-ray 12/27/2023 FINDINGS: The heart is enlarged. Left-sided pacemaker is again seen. There is central pulmonary vascular congestion. Cluster of micro nodules seen in the right upper lobe. No pleural effusion or pneumothorax. No acute fractures. IMPRESSION: 1. Cardiomegaly with central pulmonary vascular congestion. 2. Cluster of micro nodules in the right upper lobe, possibly infectious/inflammatory. This can be further evaluated with chest CT.  Electronically Signed   By: Greig Pique M.D.   On: 04/13/2024 20:59     Assessment and Plan:  Chronic combined heart failure: EF 20-25% on echo.  Appears euvolemic on exam.  BNP 27.  RAP 3 on echo. - GDMT with spironolactone  12.5 mg daily, Farxiga  10 mg daily.  She previously failed lisinopril  due to hypotension. - Holding diuretics currently due to AKI.  She appears euvolemic on exam, would monitor renal function and will restart diuretics if stable  Paroxysmal atrial fibrillation: on amiodarone  100 mg daily.  QTc prolonged, would hold amiodarone . Continue Eliquis  5 mg twice daily  AKI on CKD: Baseline creatinine around 2.  2.5 on admission, will monitor  COPD: Needs to quit smoking    For questions or updates, please contact [facility] Please consult [email or web address] for contact info under    Signed, Lonni LITTIE Nanas, MD  04/15/2024 4:02 PM

## 2024-04-15 NOTE — Progress Notes (Signed)
 HD#0 Subjective:  Summary: [name] is a 73 y.o. female with PMH of HFrEF with ICD, COPD, CKD stage IV, T2DM, HTN, paroxysmal A-flutter, and tobacco use disorder  who presents with DOE and admitted for AoC hypoxic respiratory failure.  Overnight Events: No overnight events  Interval History: [name] was sitting at the side of her bed this morning and was eating. She reports that the food is ok. She says that she feels better than when she first came in to the hospital but does not feel that her shortness of breath has resolved. Patient reports that she noticed shortness of breath most with exertion. No other complaints at this time.   Objective:  Vital signs in last 24 hours: Vitals:   04/15/24 0015 04/15/24 0129 04/15/24 0416 04/15/24 0501  BP: (!) 98/52  118/65 111/60  Pulse: 85  81 84  Resp: 18  17 18   Temp: 98.1 F (36.7 C)  97.7 F (36.5 C) 97.7 F (36.5 C)  TempSrc:   Oral Oral  SpO2: 98%  96% 100%  Weight:  74.8 kg    Height:       Supplemental O2: Room Air SpO2: 100 % O2 Flow Rate (L/min): 2 L/min   Physical Exam:  Constitutional: well-appearing  sitting at side of bed, in no acute distress Cardiovascular: regular rate and rhythm, no m/r/g Pulmonary/Chest: normal work of breathing on room air, bilateral fine crackles heard posteriorly at mid lung and lower bases of lungs. Improved from yesterday .  Neurological: alert  Skin: darkened skin thickening present on back of neck and extensor surface of arms. Rough to the touch. Also has these patches on her back as well.   Filed Weights   04/13/24 1853 04/14/24 0420 04/15/24 0129  Weight: 74.8 kg 74.8 kg 74.8 kg     Intake/Output Summary (Last 24 hours) at 04/15/2024 0526 Last data filed at 04/14/2024 2000 Gross per 24 hour  Intake 438 ml  Output 250 ml  Net 188 ml   Net IO Since Admission: 198 mL [04/15/24 0526]  Pertinent Labs:    Latest Ref Rng & Units 04/15/2024    4:13 AM 04/14/2024    5:41 AM  04/13/2024    7:26 PM  CBC  WBC 4.0 - 10.5 K/uL 8.7  8.7    Hemoglobin 12.0 - 15.0 g/dL 88.7  89.0  87.0   Hematocrit 36.0 - 46.0 % 35.6  35.3  38.0   Platelets 150 - 400 K/uL 335  338         Latest Ref Rng & Units 04/15/2024    4:13 AM 04/14/2024    5:41 AM 04/13/2024    7:26 PM  CMP  Glucose 70 - 99 mg/dL 874  889    BUN 8 - 23 mg/dL 42  36    Creatinine 9.55 - 1.00 mg/dL 7.49  7.42    Sodium 864 - 145 mmol/L 139  141  140   Potassium 3.5 - 5.1 mmol/L 4.2  3.9  3.6   Chloride 98 - 111 mmol/L 99  101    CO2 22 - 32 mmol/L 30  28    Calcium  8.9 - 10.3 mg/dL 9.3  9.6      Imaging: ECHOCARDIOGRAM COMPLETE Result Date: 04/14/2024    ECHOCARDIOGRAM REPORT   Patient Name:   [name] Date of Exam: 04/14/2024 Medical Rec #:  [id number]    Height:       62.0 in  Accession #:    [id number]   Weight:       165.0 lb Date of Birth:  January 03, 1951   BSA:          1.762 m Patient Age:    72 years     BP:           90/62 mmHg Patient Gender: F            HR:           87 bpm. Exam Location:  Inpatient Procedure: 2D Echo, 3D Echo, Color Doppler, Cardiac Doppler and Strain Analysis            (Both Spectral and Color Flow Doppler were utilized during            procedure). Indications:    Dyspnea  History:        Patient has prior history of Echocardiogram examinations, most                 recent 12/13/2023. CHF and Cardiomyopathy, Defibrillator, COPD,                 Signs/Symptoms:Shortness of Breath; Risk Factors:Hypertension,                 Diabetes, Dyslipidemia, Current Smoker and Sleep Apnea.  Sonographer:    Logan Shove RDCS Referring Phys: [phone] JULIE ANNE WILLIAMS IMPRESSIONS  1. Left ventricular ejection fraction, by estimation, is 20 to 25%. The left ventricle has severely decreased function. The left ventricle demonstrates global hypokinesis. There is mild left ventricular hypertrophy. Left ventricular diastolic parameters  are indeterminate.  2. Right ventricular systolic function is moderately  reduced. The right ventricular size is normal.  3. Mild mitral valve regurgitation.  4. Tricuspid valve regurgitation is mild to moderate.  5. AV may be functionally bicuspid. The aortic valve is bicuspid. Aortic valve regurgitation is mild.  6. The inferior vena cava is normal in size with greater than 50% respiratory variability, suggesting right atrial pressure of 3 mmHg. FINDINGS  Left Ventricle: Left ventricular ejection fraction, by estimation, is 20 to 25%. The left ventricle has severely decreased function. The left ventricle demonstrates global hypokinesis. The left ventricular internal cavity size was normal in size. There is mild left ventricular hypertrophy. Left ventricular diastolic parameters are indeterminate. Right Ventricle: The right ventricular size is normal. Right vetricular wall thickness was not assessed. Right ventricular systolic function is moderately reduced. Left Atrium: Left atrial size was normal in size. Right Atrium: Right atrial size was normal in size. Pericardium: Trivial pericardial effusion is present. Mitral Valve: There is mild thickening of the mitral valve leaflet(s). Mild to moderate mitral annular calcification. Mild mitral valve regurgitation. MV peak gradient, 7.4 mmHg. The mean mitral valve gradient is 2.0 mmHg. Tricuspid Valve: The tricuspid valve is normal in structure. Tricuspid valve regurgitation is mild to moderate. Aortic Valve: AV may be functionally bicuspid. The aortic valve is bicuspid. Aortic valve regurgitation is mild. Aortic valve mean gradient measures 8.0 mmHg. Aortic valve peak gradient measures 13.4 mmHg. Aortic valve area, by VTI measures 1.83 cm. Pulmonic Valve: The pulmonic valve was normal in structure. Pulmonic valve regurgitation is not visualized. Aorta: The aortic root and ascending aorta are structurally normal, with no evidence of dilitation. Venous: The inferior vena cava is normal in size with greater than 50% respiratory variability,  suggesting right atrial pressure of 3 mmHg. IAS/Shunts: No atrial level shunt detected by color flow Doppler.  LEFT VENTRICLE PLAX 2D  LVIDd:         4.90 cm   Diastology LVIDs:         4.30 cm   LV e' medial:    3.48 cm/s LV PW:         1.10 cm   LV E/e' medial:  35.1 LV IVS:        1.20 cm   LV e' lateral:   4.13 cm/s LVOT diam:     2.30 cm   LV E/e' lateral: 29.5 LV SV:         69 LV SV Index:   39 LVOT Area:     4.15 cm                           3D Volume EF:                          3D EF:        22 %                          LV EDV:       141 ml                          LV ESV:       110 ml                          LV SV:        31 ml RIGHT VENTRICLE            IVC RV Basal diam:  2.90 cm    IVC diam: 1.30 cm RV S prime:     7.62 cm/s TAPSE (M-mode): 1.7 cm LEFT ATRIUM           Index        RIGHT ATRIUM           Index LA diam:      4.60 cm 2.61 cm/m   RA Area:     11.20 cm LA Vol (A2C): 49.8 ml 28.27 ml/m  RA Volume:   23.90 ml  13.57 ml/m LA Vol (A4C): 52.2 ml 29.63 ml/m  AORTIC VALVE AV Area (Vmax):    1.88 cm AV Area (Vmean):   1.79 cm AV Area (VTI):     1.83 cm AV Vmax:           183.00 cm/s AV Vmean:          134.000 cm/s AV VTI:            0.380 m AV Peak Grad:      13.4 mmHg AV Mean Grad:      8.0 mmHg LVOT Vmax:         82.80 cm/s LVOT Vmean:        57.600 cm/s LVOT VTI:          0.167 m LVOT/AV VTI ratio: 0.44  AORTA Ao Root diam: 3.20 cm Ao Asc diam:  3.30 cm MITRAL VALVE MV Area (PHT): 5.50 cm     SHUNTS MV Peak grad:  7.4 mmHg     Systemic VTI:  0.17 m MV Mean grad:  2.0 mmHg     Systemic Diam: 2.30 cm MV Vmax:       1.36 m/s MV Vmean:  70.5 cm/s MV Decel Time: 138 msec MV E velocity: 122.00 cm/s Vina Gull MD Electronically signed by Vina Gull MD Signature Date/Time: 04/14/2024/5:17:26 PM    Final     Assessment/Plan:   Principal Problem:   Acute on chronic hypoxic respiratory failure [facility])   Patient Summary: MANJIT BUFANO is a 73 y.o. with a pertinent PMH of HFrEF with  ICD, COPD, CKD stage IV, T2DM, HTN, paroxysmal A-flutter, and tobacco use disorder, who presented with DOE and admitted for AoC hypoxic respiratory failure.   Acute on chronic hypoxic respiratory failure (home 2L O2 at bedtime) HFrEF (EF 35-40%) COPD  Improving. Now on RA at rest.heat exhaustion could have played a role in her SOB given she was standing in the heat/humidity. Deconditioning from patient's ankle injury also could have played a part. Echo repeated yesterday shows EF of 20-25%, down from 35 to 40% in March 2025. Severe LV decreased function.   - Given new findings on echo will consult Cardiology.  -yesterday patient received 80 mg torsemide , but due to creatinine held evening dose. Will hold torsemide  completely at this time and look for further improvement of creatinine.  -Hold spironolactone  and Farxiga  due to kidney function  -Continue home Anoro inhaler and PRN albuterol   -Trend BMP -Monitor volume status, I&Os and replete electrolytes as needed -Wean supplemental O2 as tolerated, SpO2 goal 88-92%   AKI on CKD stage IV Scr trended down from 2.57 to 2.5. Appears BL of 1.9-2.1. No change in UOP and remains adherent to home torsemide .  UA without proteinuria, ketones or signs of UTI.  - 200 mL urine output documented.  -holding torsemide , spironolactone  and farxiga  in this setting due to kidney function.  -Repeat BMP in the afternoon for kidney function. Can reassess adding GDMT tomorrow pending kidney fxn  -Encourage adequate po hydration. This AKI could have been secondary to decreased PO intake and diuresis -Monitor I&Os   Right ankle swelling Patient has had recurrent issues with right ankle swelling after undergoing ORIF of her right ankle in 2020.  Recently seen in our clinic for similar issue.  Reports that the swelling is now accompanied by some pain in his limited her mobility.  There is fluctuant edema around right malleolus without TTP or signs of infection at this  time.  Xray with soft tissue swelling but no acute findings.  -PT/OT evaluation  -Voltaren  gel PRN -Elevate RLE   Right upper lobe micronodules Tobacco use disorder Chest x-ray shows a cluster of micronodules in the right upper lobe that could be infectious or inflammatory.  Currently low suspicion for pulmonary infection. She remains afebrile and no leukocytosis. Recommend repeat chest x-ray versus chest CT outpatient in 3 to 4 weeks. Continue smoking cessation counseling.   Paroxysmal atrial flutter on anticoagulation PTA on amiodarone  100 mg, bisoprolol  2.5  mg and eliquis  5 mg BID. In sinus rhythm and normal rate. No recent dispense of bisoprolol .  -Continue home amiodarone  and Eliquis    T2DM, controlled on insulin  therapy  A1c 7.5 in 03/2024. PTA on Tresiba  5 units daily, Farxiga  10 mg daily and Ozempic  1 mg weekly. Fasting glucose 110.  -CBG monitoring -Continue insulin  glargine 5 units daily  -Hold home Farxiga  with renal status   Diet: Carb-Modified IVF: none Abx: none VTE: DOAC Code: DNR/DNI PT/OT recs: Pending  Family Update: no family at bedside this AM  Dispo: Anticipated discharge to Home in 2 days pending medical stability.   [facility]  Internal Medicine Resident PGY-1  Please contact the on call pager at [phone].

## 2024-04-15 NOTE — Progress Notes (Deleted)
  Electrophysiology Office Note:   ID:  [name], [place] 12-29-1950, MRN [id number]  Primary Cardiologist: None Electrophysiologist: Danelle Birmingham, MD *** {Click to update primary MD,subspecialty MD or APP then REFRESH:1}    History of Present Illness:   TRICHELLE [name] is a 73 y.o. female with h/o *** seen today for {VISITTYPE:28148}  Review of systems complete and found to be negative unless listed in HPI.   EP Information / Studies Reviewed:    {EKGtoday:28818}       ICD Interrogation-  reviewed in detail today,  See PACEART report.  Arrhythmia/Device History ICD-St.Jude-Merlin   Physical Exam:   VS:  There were no vitals taken for this visit.   Wt Readings from Last 3 Encounters:  04/15/24 165 lb (74.8 kg)  03/25/24 165 lb 9.6 oz (75.1 kg)  01/22/24 185 lb 4.8 oz (84.1 kg)     GEN: No acute distress *** NECK: No JVD; No carotid bruits CARDIAC: {EPRHYTHM:28826}, no murmurs, rubs, gallops RESPIRATORY:  Clear to auscultation without rales, wheezing or rhonchi  ABDOMEN: Soft, non-tender, non-distended EXTREMITIES:  {EDEMA LEVEL:28147::No} edema; No deformity   ASSESSMENT AND PLAN:    Chronic systolic CHF  s/p Abbott single chamber ICD  euvolemic today Stable on an appropriate medical regimen Normal ICD function See Pace Art report No changes today  Disposition:   Follow up with {EPPROVIDERS:28135::EP Team} {EPFOLLOW UP:28173}   Signed, Ozell Prentice Passey, PA-C

## 2024-04-15 NOTE — Progress Notes (Signed)
 Heart Failure Navigator Progress Note  Assessed for Heart & Vascular TOC clinic readiness.  Patient does not meet criteria due to Advanced heart Failure Team patient of Dr. Gala Romney. .   Navigator will sign off at this time.    Rhae Hammock, BSN, Scientist, clinical (histocompatibility and immunogenetics) Only

## 2024-04-15 NOTE — Progress Notes (Signed)
 Physical Therapy Treatment Patient Details Name: [name] MRN: [id number] DOB: Feb 18, 1951 Today's Date: 04/15/2024   History of Present Illness Pt is a 73 y/o female admitted for acute on chronic hypoxic respiratory failure. was attendign a showing of [employer] at the [employer] when she felt short of breath; PMH significant for COPD Gold stage II with chronic hypoxic respite failure on nocturnal nasal cannula, chronic combined HFpEF and HFrEF, LVEF 20-25%, status post AICD, IIDM, cigarette smoker, HTN, HLD, lipoma, depression.    PT Comments  Pt much improved this date. Pt denies lightheaded or dizziness with mobility this date. RN reports pt took a shower this morning independently. Pt able to amb with rollator 200' and completed necessary stair negotiation to enter home without dizziness or lightheadedness and no LOB. Acute PT to cont to follow while in hospital however I don't anticipate she will need follow up PT upon d/c.    If plan is discharge home, recommend the following: Assist for transportation   Can travel by private vehicle        Equipment Recommendations  None recommended by PT    Recommendations for Other Services       Precautions / Restrictions Precautions Precautions: Fall Restrictions Weight Bearing Restrictions Per Provider Order: No     Mobility  Bed Mobility Overal bed mobility: Modified Independent Bed Mobility: Supine to Sit           General bed mobility comments: HOB elevated, no difficulty    Transfers Overall transfer level: Needs assistance Equipment used: Rolling walker (2 wheels), None Transfers: Sit to/from Stand Sit to Stand: Supervision           General transfer comment: stood from EOB without difficulty, good technique    Ambulation/Gait Ambulation/Gait assistance: Supervision Gait Distance (Feet): 120 Feet Assistive device: Rollator (4 wheels) Gait Pattern/deviations: Step-through pattern Gait velocity:  dec     General Gait Details: mild antalgia with R LE limp however denies pain, good rolaltor management   Stairs Stairs: Yes Stairs assistance: Contact guard assist Stair Management: One rail Right, Step to pattern, Sideways Number of Stairs: 3 (to mimic home) General stair comments: educated on sideways technique to improved stability by using both hands on rail and leading up with the L (stronger leg) and leading down with the R (weaker leg)   Wheelchair Mobility     Tilt Bed    Modified Rankin (Stroke Patients Only)       Balance Overall balance assessment: Mild deficits observed, not formally tested                                          Communication Communication Communication: No apparent difficulties  Cognition Arousal: Alert Behavior During Therapy: WFL for tasks assessed/performed   PT - Cognitive impairments: No apparent impairments                         Following commands: Intact      Cueing Cueing Techniques: Verbal cues  Exercises      General Comments General comments (skin integrity, edema, etc.): VSS      Pertinent Vitals/Pain Pain Assessment Pain Assessment: No/denies pain    Home Living                          Prior  Function            PT Goals (current goals can now be found in the care plan section) Acute Rehab PT Goals Patient Stated Goal: home PT Goal Formulation: With patient Time For Goal Achievement: 04/28/24 Potential to Achieve Goals: Good Progress towards PT goals: Progressing toward goals    Frequency    Min 2X/week      PT Plan      Co-evaluation              AM-PAC PT 6 Clicks Mobility   Outcome Measure  Help needed turning from your back to your side while in a flat bed without using bedrails?: None Help needed moving from lying on your back to sitting on the side of a flat bed without using bedrails?: None Help needed moving to and from a bed to a  chair (including a wheelchair)?: None Help needed standing up from a chair using your arms (e.g., wheelchair or bedside chair)?: A Little Help needed to walk in hospital room?: A Little Help needed climbing 3-5 steps with a railing? : A Lot 6 Click Score: 20    End of Session Equipment Utilized During Treatment: Gait belt Activity Tolerance: Patient tolerated treatment well;Other (comment) Patient left: in bed;with call bell/phone within reach (sitting EOB) Nurse Communication: Mobility status PT Visit Diagnosis: Unsteadiness on feet (R26.81);Other abnormalities of gait and mobility (R26.89)     Time: 8850-8798 PT Time Calculation (min) (ACUTE ONLY): 12 min  Charges:    $Gait Training: 8-22 mins PT General Charges $$ ACUTE PT VISIT: 1 Visit                     Norene Ames, PT, DPT Acute Rehabilitation Services Secure chat preferred Office #: [phone]    Norene CHRISTELLA Ames 04/15/2024, 1:57 PM

## 2024-04-15 NOTE — Progress Notes (Signed)
 Transition of Care [facility]) - Inpatient Brief Assessment   Patient Details  Name: [name] MRN: [id number] Date of Birth: 12/27/1950  Transition [facility]) CM/SW Contact:    [name], RN Phone Number: 04/15/2024, 1:35 PM   Clinical Narrative: CM met with the patient at the bedside to discuss TOC needs.  Patient admitted for acute respiratory failure and is currently on RA.  Patient wears oxygen  at nighttime only at 2L/min [place] thru [employer].  Other DME at home includes Cane, toilet riser, tub bench, power wheelchair, RW and 3:1.  Medicare choice regarding home health was offered to the patient and patient did not have a preference.  I called [facility] and Rinda Sayres, CM accepted for services.  MD asked to co-sign order.  No other TOC needs at this time.  CM will continue to follow the patient as patient progresses.  Patient states that her family will provide transportation to home via car when patient stable for discharge.   Transition of Care Asessment: Insurance and Status: (P) Insurance coverage has been reviewed Patient has primary care physician: (P) Yes Home environment has been reviewed: (P) from home alone Prior level of function:: (P) RW, Power WC Prior/Current Home Services: (P) No current home services Social Drivers of Health Review: (P) SDOH reviewed needs interventions Readmission risk has been reviewed: (P) Yes Transition of care needs: (P) transition of care needs identified, TOC will continue to follow

## 2024-04-16 ENCOUNTER — Ambulatory Visit: Admitting: Student

## 2024-04-16 DIAGNOSIS — E119 Type 2 diabetes mellitus without complications: Secondary | ICD-10-CM | POA: Diagnosis not present

## 2024-04-16 DIAGNOSIS — N184 Chronic kidney disease, stage 4 (severe): Secondary | ICD-10-CM | POA: Diagnosis not present

## 2024-04-16 DIAGNOSIS — I480 Paroxysmal atrial fibrillation: Secondary | ICD-10-CM | POA: Diagnosis not present

## 2024-04-16 DIAGNOSIS — Z7985 Long-term (current) use of injectable non-insulin antidiabetic drugs: Secondary | ICD-10-CM | POA: Diagnosis not present

## 2024-04-16 DIAGNOSIS — M25471 Effusion, right ankle: Secondary | ICD-10-CM | POA: Diagnosis not present

## 2024-04-16 DIAGNOSIS — Z7982 Long term (current) use of aspirin: Secondary | ICD-10-CM | POA: Diagnosis not present

## 2024-04-16 DIAGNOSIS — I130 Hypertensive heart and chronic kidney disease with heart failure and stage 1 through stage 4 chronic kidney disease, or unspecified chronic kidney disease: Secondary | ICD-10-CM | POA: Diagnosis not present

## 2024-04-16 DIAGNOSIS — J9621 Acute and chronic respiratory failure with hypoxia: Secondary | ICD-10-CM | POA: Diagnosis not present

## 2024-04-16 DIAGNOSIS — I4581 Long QT syndrome: Secondary | ICD-10-CM | POA: Diagnosis not present

## 2024-04-16 DIAGNOSIS — Z1152 Encounter for screening for COVID-19: Secondary | ICD-10-CM | POA: Diagnosis not present

## 2024-04-16 DIAGNOSIS — R918 Other nonspecific abnormal finding of lung field: Secondary | ICD-10-CM | POA: Diagnosis not present

## 2024-04-16 DIAGNOSIS — I5042 Chronic combined systolic (congestive) and diastolic (congestive) heart failure: Secondary | ICD-10-CM | POA: Diagnosis not present

## 2024-04-16 DIAGNOSIS — E1122 Type 2 diabetes mellitus with diabetic chronic kidney disease: Secondary | ICD-10-CM | POA: Diagnosis not present

## 2024-04-16 DIAGNOSIS — Z794 Long term (current) use of insulin: Secondary | ICD-10-CM | POA: Diagnosis not present

## 2024-04-16 DIAGNOSIS — I5020 Unspecified systolic (congestive) heart failure: Secondary | ICD-10-CM | POA: Diagnosis not present

## 2024-04-16 DIAGNOSIS — Z7901 Long term (current) use of anticoagulants: Secondary | ICD-10-CM | POA: Diagnosis not present

## 2024-04-16 DIAGNOSIS — F17210 Nicotine dependence, cigarettes, uncomplicated: Secondary | ICD-10-CM | POA: Diagnosis not present

## 2024-04-16 DIAGNOSIS — N179 Acute kidney failure, unspecified: Secondary | ICD-10-CM | POA: Diagnosis not present

## 2024-04-16 DIAGNOSIS — J449 Chronic obstructive pulmonary disease, unspecified: Secondary | ICD-10-CM | POA: Diagnosis not present

## 2024-04-16 LAB — BASIC METABOLIC PANEL WITH GFR
Anion gap: 9 (ref 5–15)
BUN: 44 mg/dL — ABNORMAL HIGH (ref 8–23)
CO2: 31 mmol/L (ref 22–32)
Calcium: 9.1 mg/dL (ref 8.9–10.3)
Chloride: 100 mmol/L (ref 98–111)
Creatinine, Ser: 2.32 mg/dL — ABNORMAL HIGH (ref 0.44–1.00)
GFR, Estimated: 22 mL/min — ABNORMAL LOW (ref >60)
Glucose, Bld: 115 mg/dL — ABNORMAL HIGH (ref 70–99)
Potassium: 4.1 mmol/L (ref 3.5–5.1)
Sodium: 140 mmol/L (ref 135–145)

## 2024-04-16 LAB — GLUCOSE, CAPILLARY
Glucose-Capillary: 185 mg/dL — ABNORMAL HIGH (ref 70–99)
Glucose-Capillary: 253 mg/dL — ABNORMAL HIGH (ref 70–99)
Glucose-Capillary: 181 mg/dL — ABNORMAL HIGH (ref 70–99)
Glucose-Capillary: 152 mg/dL — ABNORMAL HIGH (ref 70–99)

## 2024-04-16 MED ORDER — DAPAGLIFLOZIN PROPANEDIOL 10 MG PO TABS
10.0000 mg | ORAL_TABLET | Freq: Every day | ORAL | Status: DC
  Administered 2024-04-16 – 2024-04-17 (×2): 10 mg via ORAL
  Filled 2024-04-16 (×2): qty 1

## 2024-04-16 MED ORDER — BISOPROLOL FUMARATE 5 MG PO TABS
2.5000 mg | ORAL_TABLET | Freq: Every day | ORAL | Status: DC
  Administered 2024-04-16 – 2024-04-17 (×2): 2.5 mg via ORAL
  Filled 2024-04-16 (×2): qty 0.5

## 2024-04-16 MED ORDER — SPIRONOLACTONE 12.5 MG HALF TABLET: 12.5000 mg | ORAL_TABLET | Freq: Every day | ORAL | Status: DC

## 2024-04-16 NOTE — Progress Notes (Signed)
 Mobility Specialist: Progress Note   04/16/24 1000  Mobility  Activity Ambulated with assistance in hallway  Level of Assistance Standby assist, set-up cues, supervision of patient - no hands on  Assistive Device Front wheel walker  Distance Ambulated (ft) 300 ft  Activity Response Tolerated well  Mobility Referral Yes  Mobility visit 1 Mobility  Mobility Specialist Start Time (ACUTE ONLY) 0855  Mobility Specialist Stop Time (ACUTE ONLY) 0910  Mobility Specialist Time Calculation (min) (ACUTE ONLY) 15 min    Pt received on EOB, agreeable to mobility session. Completed ambulatory sat test (see following note). SBA throughout w/ rollator. C/o bil ankle pain and swelling. Audibly SOB, denies any dizziness or lightheadedness, SpO2 >90% on RA throughout. Returned to room without fault. Left on EOB with all needs met, call bell in reach.   Ileana Lute Mobility Specialist Please contact via SecureChat or Rehab office at [phone]

## 2024-04-16 NOTE — Progress Notes (Signed)
 Rounding Note   Patient Name: [name] Date of Encounter: 04/16/2024  [facility] Cardiologist: None   Subjective BP 125/72. Cr improved to 2.3 today.  She denies any chest pain or dyspnea  Scheduled Meds:  apixaban   5 mg Oral BID   aspirin  EC  81 mg Oral Daily   atorvastatin   80 mg Oral QHS   calcium -vitamin D   1 tablet Oral BID   cetirizine   10 mg Oral QHS   diclofenac  Sodium  4 g Topical QID   insulin  aspart  0-6 Units Subcutaneous TID WC   insulin  glargine-yfgn  5 Units Subcutaneous Daily   umeclidinium-vilanterol  1 puff Inhalation Daily   Continuous Infusions:  PRN Meds: albuterol    Vital Signs  Vitals:   04/16/24 0507 04/16/24 0507 04/16/24 0508 04/16/24 0641  BP: 125/72 125/72    Pulse: 85 86    Resp: 18 18    Temp: 98 F (36.7 C) 98 F (36.7 C)    TempSrc: Oral Oral    SpO2:  100%  100%  Weight:   76.3 kg 76.3 kg  Height:        Intake/Output Summary (Last 24 hours) at 04/16/2024 0906 Last data filed at 04/16/2024 0056 Gross per 24 hour  Intake 300 ml  Output --  Net 300 ml      04/16/2024    6:41 AM 04/16/2024    5:08 AM 04/15/2024    1:29 AM  Last 3 Weights  Weight (lbs) 168 lb 4.8 oz 168 lb 3.4 oz 165 lb  Weight (kg) 76.34 kg 76.3 kg 74.844 kg      Telemetry NSR - Personally Reviewed  ECG  No new ECG - Personally Reviewed  Physical Exam  GEN: No acute distress.   Neck: No JVD Cardiac: RRR, no murmurs, rubs, or gallops.  Respiratory: Clear to auscultation bilaterally. GI: Soft, nontender, non-distended  MS: No edema; No deformity. Neuro:  Nonfocal  Psych: Normal affect   Labs High Sensitivity Troponin:   Recent Labs  Lab 04/13/24 1856 04/13/24 2100  TROPONINIHS 15 15     Chemistry Recent Labs  Lab 04/13/24 1856 04/13/24 1926 04/15/24 0413 04/15/24 1546 04/16/24 0439  NA 142   < > 139 138 140  K 3.7   < > 4.2 4.0 4.1  CL 100   < > 99 96* 100  CO2 27   < > 30 30 31   GLUCOSE 142*   < > 125* 159* 115*   BUN 35*   < > 42* 42* 44*  CREATININE 2.54*   < > 2.50* 2.53* 2.32*  CALCIUM  9.3   < > 9.3 9.1 9.1  MG 1.8  --   --   --   --   PROT 7.4  --   --   --   --   ALBUMIN  3.5  --   --   --   --   AST 17  --   --   --   --   ALT 13  --   --   --   --   ALKPHOS 99  --   --   --   --   BILITOT 0.5  --   --   --   --   GFRNONAA 20*   < > 20* 20* 22*  ANIONGAP 15   < > 10 12 9    < > = values in this interval not displayed.    Lipids  No results for input(s): CHOL, TRIG, HDL, LABVLDL, LDLCALC, CHOLHDL in the last 168 hours.  Hematology Recent Labs  Lab 04/13/24 1856 04/13/24 1926 04/14/24 0541 04/15/24 0413  WBC 9.9  --  8.7 8.7  RBC 4.85  --  4.31 4.34  HGB 12.3 12.9 10.9* 11.2*  HCT 39.6 38.0 35.3* 35.6*  MCV 81.6  --  81.9 82.0  MCH 25.4*  --  25.3* 25.8*  MCHC 31.1  --  30.9 31.5  RDW 15.1  --  15.1 14.9  PLT 346  --  338 335   Thyroid  No results for input(s): TSH, FREET4 in the last 168 hours.  BNP Recent Labs  Lab 04/13/24 1856  BNP 21.7    DDimer No results for input(s): DDIMER in the last 168 hours.   Radiology  ECHOCARDIOGRAM COMPLETE Result Date: 04/14/2024    ECHOCARDIOGRAM REPORT   Patient Name:   LAVEYAH ORIOL Date of Exam: 04/14/2024 Medical Rec #:  [id number]    Height:       62.0 in Accession #:    [id number]   Weight:       165.0 lb Date of Birth:  07/04/1951   BSA:          1.762 m Patient Age:    73 years     BP:           90/62 mmHg Patient Gender: F            HR:           87 bpm. Exam Location:  Inpatient Procedure: 2D Echo, 3D Echo, Color Doppler, Cardiac Doppler and Strain Analysis            (Both Spectral and Color Flow Doppler were utilized during            procedure). Indications:    Dyspnea  History:        Patient has prior history of Echocardiogram examinations, most                 recent 12/13/2023. CHF and Cardiomyopathy, Defibrillator, COPD,                 Signs/Symptoms:Shortness of Breath; Risk Factors:Hypertension,                  Diabetes, Dyslipidemia, Current Smoker and Sleep Apnea.  Sonographer:    Logan Shove RDCS Referring Phys: [phone] JULIE ANNE WILLIAMS IMPRESSIONS  1. Left ventricular ejection fraction, by estimation, is 20 to 25%. The left ventricle has severely decreased function. The left ventricle demonstrates global hypokinesis. There is mild left ventricular hypertrophy. Left ventricular diastolic parameters  are indeterminate.  2. Right ventricular systolic function is moderately reduced. The right ventricular size is normal.  3. Mild mitral valve regurgitation.  4. Tricuspid valve regurgitation is mild to moderate.  5. AV may be functionally bicuspid. The aortic valve is bicuspid. Aortic valve regurgitation is mild.  6. The inferior vena cava is normal in size with greater than 50% respiratory variability, suggesting right atrial pressure of 3 mmHg. FINDINGS  Left Ventricle: Left ventricular ejection fraction, by estimation, is 20 to 25%. The left ventricle has severely decreased function. The left ventricle demonstrates global hypokinesis. The left ventricular internal cavity size was normal in size. There is mild left ventricular hypertrophy. Left ventricular diastolic parameters are indeterminate. Right Ventricle: The right ventricular size is normal. Right vetricular wall thickness was not assessed. Right ventricular systolic function is moderately reduced. Left  Atrium: Left atrial size was normal in size. Right Atrium: Right atrial size was normal in size. Pericardium: Trivial pericardial effusion is present. Mitral Valve: There is mild thickening of the mitral valve leaflet(s). Mild to moderate mitral annular calcification. Mild mitral valve regurgitation. MV peak gradient, 7.4 mmHg. The mean mitral valve gradient is 2.0 mmHg. Tricuspid Valve: The tricuspid valve is normal in structure. Tricuspid valve regurgitation is mild to moderate. Aortic Valve: AV may be functionally bicuspid. The aortic valve is bicuspid. Aortic  valve regurgitation is mild. Aortic valve mean gradient measures 8.0 mmHg. Aortic valve peak gradient measures 13.4 mmHg. Aortic valve area, by VTI measures 1.83 cm. Pulmonic Valve: The pulmonic valve was normal in structure. Pulmonic valve regurgitation is not visualized. Aorta: The aortic root and ascending aorta are structurally normal, with no evidence of dilitation. Venous: The inferior vena cava is normal in size with greater than 50% respiratory variability, suggesting right atrial pressure of 3 mmHg. IAS/Shunts: No atrial level shunt detected by color flow Doppler.  LEFT VENTRICLE PLAX 2D LVIDd:         4.90 cm   Diastology LVIDs:         4.30 cm   LV e' medial:    3.48 cm/s LV PW:         1.10 cm   LV E/e' medial:  35.1 LV IVS:        1.20 cm   LV e' lateral:   4.13 cm/s LVOT diam:     2.30 cm   LV E/e' lateral: 29.5 LV SV:         69 LV SV Index:   39 LVOT Area:     4.15 cm                           3D Volume EF:                          3D EF:        22 %                          LV EDV:       141 ml                          LV ESV:       110 ml                          LV SV:        31 ml RIGHT VENTRICLE            IVC RV Basal diam:  2.90 cm    IVC diam: 1.30 cm RV S prime:     7.62 cm/s TAPSE (M-mode): 1.7 cm LEFT ATRIUM           Index        RIGHT ATRIUM           Index LA diam:      4.60 cm 2.61 cm/m   RA Area:     11.20 cm LA Vol (A2C): 49.8 ml 28.27 ml/m  RA Volume:   23.90 ml  13.57 ml/m LA Vol (A4C): 52.2 ml 29.63 ml/m  AORTIC VALVE AV Area (Vmax):    1.88 cm AV Area (Vmean):   1.79 cm  AV Area (VTI):     1.83 cm AV Vmax:           183.00 cm/s AV Vmean:          134.000 cm/s AV VTI:            0.380 m AV Peak Grad:      13.4 mmHg AV Mean Grad:      8.0 mmHg LVOT Vmax:         82.80 cm/s LVOT Vmean:        57.600 cm/s LVOT VTI:          0.167 m LVOT/AV VTI ratio: 0.44  AORTA Ao Root diam: 3.20 cm Ao Asc diam:  3.30 cm MITRAL VALVE MV Area (PHT): 5.50 cm     SHUNTS MV Peak grad:  7.4  mmHg     Systemic VTI:  0.17 m MV Mean grad:  2.0 mmHg     Systemic Diam: 2.30 cm MV Vmax:       1.36 m/s MV Vmean:      70.5 cm/s MV Decel Time: 138 msec MV E velocity: 122.00 cm/s Vina Gull MD Electronically signed by Vina Gull MD Signature Date/Time: 04/14/2024/5:17:26 PM    Final     Cardiac Studies   Patient Profile   73 y.o. female with a hx of chronic combined heart failure, COPD, hypertension, chronic respiratory failure, T2DM who we are consulted for heart failure  Assessment & Plan   Chronic combined heart failure: EF 20-25% on echo this admission.  Appears euvolemic on exam.  BNP 27.  RAP 3 on echo. - GDMT with spironolactone  12.5 mg daily, Farxiga  10 mg daily, bisoprolol  2.5 mg daily.  She previously failed lisinopril  due to hypotension.  Was also on torsemide  80 mg BID - Would restart Farxiga  and bisoprolol .  Can restart spironolactone  if renal function continues to improve. She is euvolemic on exam, creatinine improving with holding torsemide .  Would continue to hold torsemide , can plan to discharge on torsemide  80 mg daily as needed.  Recommend monitoring daily weights and taking torsemide  if gains more than 3 pounds in 1 day or 5 pounds in 1 week   Paroxysmal atrial fibrillation: on amiodarone  100 mg daily.  QTc prolonged (547), would hold amiodarone . Continue Eliquis  5 mg twice daily.  Restart bisoprolol    AKI on CKD: Baseline creatinine around 2.  2.5 on admission, will monitor   COPD: Needs to quit smoking  For questions or updates, please contact [facility] Please consult [email or web address] for contact info under     Signed, Lonni LITTIE Nanas, MD  04/16/2024, 9:06 AM

## 2024-04-16 NOTE — TOC Progression Note (Addendum)
 Transition of Care  [facility]) - Progression Note    Patient Details  Name: [name] MRN: [id number] Date of Birth: 08/07/1951  Transition of [facility]) CM/SW Contact  [name], RN Phone Number: 04/16/2024, 4:12 PM  Clinical Narrative:    CM noted that patient has been in observation status for more than 60 hours.  Message sent to UR RN and Medical Team to evaluate since MD note states that patient would likely be medically ready for discharge in the next 2 days.  Patient has been set up with [facility] home health and home health orders are in place.  Patient has all DME at home including a rolator.  Patient plans to follow up with PCP for hospital follow up - noted in the AVs as well as reminder.  CM will continue to follow for IP Care management needs as patient progresses  04/16/24  1615 - CM spoke with attending MD team and UR RN and attending MD team states that patient will be stable to discharge tomorrow.   No other TOC needs at this time.  Patient has available transportation to home.        Expected Discharge Plan and Services                                               Social Determinants of Health (SDOH) Interventions SDOH Screenings   Food Insecurity: No Food Insecurity (04/14/2024)  Housing: Low Risk  (04/14/2024)  Transportation Needs: No Transportation Needs (04/14/2024)  Utilities: Not At Risk (04/14/2024)  Alcohol Screen: Low Risk  (01/09/2024)  Depression (PHQ2-9): Low Risk  (01/22/2024)  Financial Resource Strain: Low Risk  (01/09/2024)  Physical Activity: Inactive (01/09/2024)  Social Connections: Moderately Integrated (04/14/2024)  Stress: No Stress Concern Present (01/09/2024)  Tobacco Use: High Risk (04/14/2024)  Health Literacy: Adequate Health Literacy (01/09/2024)    Readmission Risk Interventions    06/22/2022   11:43 AM  Readmission Risk Prevention Plan  Transportation Screening Complete  PCP or Specialist Appt within 5-7 Days  Complete  Home Care Screening Complete  Medication Review (RN CM) Referral to Pharmacy

## 2024-04-16 NOTE — Progress Notes (Signed)
Nurse requested Mobility Specialist to perform oxygen saturation test with pt which includes removing pt from oxygen both at rest and while ambulating.  Below are the results from that testing.     Patient Saturations on Room Air at Rest = spO2 97%  Patient Saturations on Room Air while Ambulating = sp02 95% .    At end of testing pt left in room on RA.    Reported results to nurse.   

## 2024-04-16 NOTE — Progress Notes (Cosign Needed)
 HD#0 Subjective:  Summary: [name] is a 73 y.o. female with PMH of HFrEF with ICD, COPD, CKD stage IV, T2DM, HTN, paroxysmal A-flutter, and tobacco use disorder  who presents with DOE and admitted for AoC hypoxic respiratory failure.  Overnight Events: No overnight events  Interval History: Patient reports that her breathing has gotten much better since hospitalization. She was able to work with PT today and walked around a couple of times. Patient says she is only using oxygen  when she is sleeping, which is her baseline at 2L. She does not require much oxygen  she feels during the day.  Objective:  Vital signs in last 24 hours: Vitals:   04/16/24 0507 04/16/24 0507 04/16/24 0508 04/16/24 0641  BP: 125/72 125/72    Pulse: 85 86    Resp: 18 18    Temp: 98 F (36.7 C) 98 F (36.7 C)    TempSrc: Oral Oral    SpO2:  100%  100%  Weight:   76.3 kg 76.3 kg  Height:       Supplemental O2: Room Air SpO2: 100 % O2 Flow Rate (L/min): 2 L/min   Physical Exam:  Constitutional: well-appearing  sitting at side of bed, in no acute distress Cardiovascular: regular rate and rhythm, no m/r/g Pulmonary/Chest: normal work of breathing on room air, fine crackles heard along posterior bases bilaterally. Improved from yesterday .  Neurological: alert  MSK: trace bilaterally edema, non pitting. Right lateral malleolar fluctuant swelling, that is not hot to the touch.  Skin: darkened skin thickening present on back of neck and extensor surface of arms. Rough to the touch. Also has these patches on her back as well.   Filed Weights   04/15/24 0129 04/16/24 0508 04/16/24 0641  Weight: 74.8 kg 76.3 kg 76.3 kg     Intake/Output Summary (Last 24 hours) at 04/16/2024 0720 Last data filed at 04/16/2024 0056 Gross per 24 hour  Intake 300 ml  Output --  Net 300 ml   Net IO Since Admission: 98 mL [04/16/24 0720]  Pertinent Labs:    Latest Ref Rng & Units 04/15/2024    4:13 AM 04/14/2024     5:41 AM 04/13/2024    7:26 PM  CBC  WBC 4.0 - 10.5 K/uL 8.7  8.7    Hemoglobin 12.0 - 15.0 g/dL 88.7  89.0  87.0   Hematocrit 36.0 - 46.0 % 35.6  35.3  38.0   Platelets 150 - 400 K/uL 335  338         Latest Ref Rng & Units 04/16/2024    4:39 AM 04/15/2024    3:46 PM 04/15/2024    4:13 AM  CMP  Glucose 70 - 99 mg/dL 884  840  874   BUN 8 - 23 mg/dL 44  42  42   Creatinine 0.44 - 1.00 mg/dL 7.67  7.46  7.49   Sodium 135 - 145 mmol/L 140  138  139   Potassium 3.5 - 5.1 mmol/L 4.1  4.0  4.2   Chloride 98 - 111 mmol/L 100  96  99   CO2 22 - 32 mmol/L 31  30  30    Calcium  8.9 - 10.3 mg/dL 9.1  9.1  9.3     Imaging: No results found.   Assessment/Plan:   Principal Problem:   Acute on chronic systolic heart failure (HCC) Active Problems:   Chronic combined systolic and diastolic heart failure (HCC)   DM (diabetes mellitus) type  II controlled with renal manifestation (HCC)   Paroxysmal atrial flutter (HCC)   CKD (chronic kidney disease) stage 4, GFR 15-29 ml/min (HCC)   Right ankle swelling   Acute on chronic hypoxic respiratory failure (HCC)   QT prolongation   Patient Summary: [name] is a 73 y.o. with a pertinent PMH of HFrEF with ICD, COPD, CKD stage IV, T2DM, HTN, paroxysmal A-flutter, and tobacco use disorder, who presented with DOE and admitted for AoC hypoxic respiratory failure.   Acute on chronic hypoxic respiratory failure (home 2L O2 at bedtime) HFrEF (EF 35-40%) COPD  Improving. Now on RA at rest.heat exhaustion could have played a role in her SOB given she was standing in the heat/humidity. Echo repeated yesterday shows EF of 20-25%, down from 35 to 40% in March 2025. Severe LV decreased function.   - Given new findings on echo, Cardiology consulted.  -Start back on farxiga  10 mg and bisoprolol  2.5 mg daily per cardiology. Can consider starting spironolactone  if kidney function allows. Plan to discharge on torsemide  80 mg as needed  -Hold spironolactone  due  to kidney function  -Continue home Anoro inhaler and PRN albuterol   -Trend BMP -Monitor volume status, I&Os, daily weights and replete electrolytes as needed -Wean supplemental O2 as tolerated, SpO2 goal 88-92%   AKI on CKD stage IV Scr trended down from 2.57 to 2.5. Appears BL of 1.9-2.1. No change in UOP and remains adherent to home torsemide .  UA without proteinuria, ketones or signs of UTI.  - Urine output was not documented yesterday   -holding torsemide , spironolactone  in this setting due to kidney function.  -Kidney function is improving. Can reassess adding spironolactone  tomorrow pending further improvement  -Encourage adequate po hydration. This AKI could have been secondary to decreased PO intake and diuresis -Monitor I&Os  Orthostatic Hypotension -Orthostatic vitals are positive - Considering increasing weight, and in the setting of HFrEF, not considering fluid bolus at this time - Ordered compression hose, can consider abdominal binder if this does not work - Repeat orthostatic vitals tomorrow    Right ankle swelling Patient has had recurrent issues with right ankle swelling after undergoing ORIF of her right ankle in 2020.  Recently seen in our clinic for similar issue.  Reports that the swelling is now accompanied by some pain in his limited her mobility.  There is fluctuant edema around right malleolus without TTP or signs of infection at this time.  Xray with soft tissue swelling but no acute findings.  -PT/OT evaluation  -Voltaren  gel PRN -Elevate RLE   Right upper lobe micronodules Tobacco use disorder Chest x-ray shows a cluster of micronodules in the right upper lobe that could be infectious or inflammatory.  Currently low suspicion for pulmonary infection. She remains afebrile and no leukocytosis. Recommend repeat chest x-ray versus chest CT outpatient in 3 to 4 weeks. Continue smoking cessation counseling.   Paroxysmal atrial flutter on anticoagulation PTA on  amiodarone  100 mg, bisoprolol  2.5  mg and eliquis  5 mg BID. In sinus rhythm and normal rate. No recent dispense of bisoprolol .  -hold home amiodarone  per cardiology, continue Eliquis  5 mg daily   T2DM, controlled on insulin  therapy  A1c 7.5 in 03/2024. PTA on Tresiba  5 units daily, Farxiga  10 mg daily and Ozempic  1 mg weekly. Fasting glucose 110.  -CBG monitoring -Continue insulin  glargine 5 units daily  -Restarted on Farxiga  today   Diet: Carb-Modified IVF: none Abx: none VTE: DOAC Code: DNR/DNI PT/OT recs: Pending  Family  Update: no family at bedside this AM  Dispo: Anticipated discharge to Home in 2 days pending medical stability.   [facility]  Internal Medicine Resident PGY-1 Please contact the on call pager at [phone].

## 2024-04-16 NOTE — Plan of Care (Signed)

## 2024-04-17 ENCOUNTER — Other Ambulatory Visit (HOSPITAL_COMMUNITY): Payer: Self-pay

## 2024-04-17 DIAGNOSIS — R9431 Abnormal electrocardiogram [ECG] [EKG]: Secondary | ICD-10-CM | POA: Diagnosis not present

## 2024-04-17 DIAGNOSIS — I5042 Chronic combined systolic (congestive) and diastolic (congestive) heart failure: Secondary | ICD-10-CM | POA: Diagnosis not present

## 2024-04-17 DIAGNOSIS — I130 Hypertensive heart and chronic kidney disease with heart failure and stage 1 through stage 4 chronic kidney disease, or unspecified chronic kidney disease: Secondary | ICD-10-CM | POA: Diagnosis not present

## 2024-04-17 DIAGNOSIS — N179 Acute kidney failure, unspecified: Secondary | ICD-10-CM | POA: Diagnosis not present

## 2024-04-17 LAB — BASIC METABOLIC PANEL WITH GFR
Anion gap: 11 (ref 5–15)
BUN: 39 mg/dL — ABNORMAL HIGH (ref 8–23)
CO2: 27 mmol/L (ref 22–32)
Calcium: 9.1 mg/dL (ref 8.9–10.3)
Chloride: 101 mmol/L (ref 98–111)
Creatinine, Ser: 2.02 mg/dL — ABNORMAL HIGH (ref 0.44–1.00)
GFR, Estimated: 26 mL/min — ABNORMAL LOW (ref >60)
Glucose, Bld: 99 mg/dL (ref 70–99)
Potassium: 4.1 mmol/L (ref 3.5–5.1)
Sodium: 139 mmol/L (ref 135–145)

## 2024-04-17 LAB — MAGNESIUM: Magnesium: 2.1 mg/dL (ref 1.7–2.4)

## 2024-04-17 LAB — GLUCOSE, CAPILLARY
Glucose-Capillary: 161 mg/dL — ABNORMAL HIGH (ref 70–99)
Glucose-Capillary: 213 mg/dL — ABNORMAL HIGH (ref 70–99)

## 2024-04-17 MED ORDER — TORSEMIDE 20 MG PO TABS: 80.0000 mg | ORAL_TABLET | Freq: Every day | ORAL | Status: DC | PRN

## 2024-04-17 MED ORDER — SPIRONOLACTONE 12.5 MG HALF TABLET
12.5000 mg | ORAL_TABLET | Freq: Every day | ORAL | Status: DC
  Administered 2024-04-17: 12.5 mg via ORAL
  Filled 2024-04-17: qty 1

## 2024-04-17 MED ORDER — SPIRONOLACTONE 25 MG PO TABS
12.5000 mg | ORAL_TABLET | Freq: Every day | ORAL | 0 refills | Status: DC
  Filled 2024-04-17: qty 30, 60d supply, fill #0

## 2024-04-17 MED ORDER — BISOPROLOL FUMARATE 5 MG PO TABS
2.5000 mg | ORAL_TABLET | Freq: Every day | ORAL | 0 refills | Status: DC
  Filled 2024-04-17: qty 15, 30d supply, fill #0

## 2024-04-17 MED ORDER — INSULIN DEGLUDEC 100 UNIT/ML ~~LOC~~ SOPN: 5.0000 [IU] | PEN_INJECTOR | Freq: Every day | SUBCUTANEOUS | Status: AC

## 2024-04-17 NOTE — Progress Notes (Signed)
 Rounding Note   Patient Name: [name] Date of Encounter: 04/17/2024  [facility] Cardiologist: Lonni LITTIE Nanas, MD   Subjective BP 134/62. Cr improved to 2.0 today.  She denies any chest pain or dyspnea  Scheduled Meds:  apixaban   5 mg Oral BID   aspirin  EC  81 mg Oral Daily   atorvastatin   80 mg Oral QHS   bisoprolol   2.5 mg Oral Daily   calcium -vitamin D   1 tablet Oral BID   cetirizine   10 mg Oral QHS   dapagliflozin  propanediol  10 mg Oral Daily   diclofenac  Sodium  4 g Topical QID   insulin  aspart  0-6 Units Subcutaneous TID WC   insulin  glargine-yfgn  5 Units Subcutaneous Daily   umeclidinium-vilanterol  1 puff Inhalation Daily   Continuous Infusions:  PRN Meds: albuterol    Vital Signs  Vitals:   04/17/24 0020 04/17/24 0302 04/17/24 0640 04/17/24 0807  BP: 130/73 123/65  134/62  Pulse: 83 80  83  Resp: 18 18  16   Temp: 97.7 F (36.5 C) 97.6 F (36.4 C)  98.1 F (36.7 C)  TempSrc:      SpO2: 100% 100% 98% 100%  Weight:   77.4 kg   Height:        Intake/Output Summary (Last 24 hours) at 04/17/2024 0916 Last data filed at 04/17/2024 9360 Gross per 24 hour  Intake 580 ml  Output 1100 ml  Net -520 ml      04/17/2024    6:40 AM 04/16/2024    6:41 AM 04/16/2024    5:08 AM  Last 3 Weights  Weight (lbs) 170 lb 10.2 oz 168 lb 4.8 oz 168 lb 3.4 oz  Weight (kg) 77.4 kg 76.34 kg 76.3 kg      Telemetry NSR - Personally Reviewed  ECG  No new ECG - Personally Reviewed  Physical Exam  GEN: No acute distress.   Neck: No JVD Cardiac: RRR, no murmurs, rubs, or gallops.  Respiratory: Clear to auscultation bilaterally. GI: Soft, nontender, non-distended  MS: No edema; No deformity. Neuro:  Nonfocal  Psych: Normal affect   Labs High Sensitivity Troponin:   Recent Labs  Lab 04/13/24 1856 04/13/24 2100  TROPONINIHS 15 15     Chemistry Recent Labs  Lab 04/13/24 1856 04/13/24 1926 04/15/24 1546 04/16/24 0439 04/17/24 0305  NA  142   < > 138 140 139  K 3.7   < > 4.0 4.1 4.1  CL 100   < > 96* 100 101  CO2 27   < > 30 31 27   GLUCOSE 142*   < > 159* 115* 99  BUN 35*   < > 42* 44* 39*  CREATININE 2.54*   < > 2.53* 2.32* 2.02*  CALCIUM  9.3   < > 9.1 9.1 9.1  MG 1.8  --   --   --  2.1  PROT 7.4  --   --   --   --   ALBUMIN  3.5  --   --   --   --   AST 17  --   --   --   --   ALT 13  --   --   --   --   ALKPHOS 99  --   --   --   --   BILITOT 0.5  --   --   --   --   GFRNONAA 20*   < > 20* 22* 26*  ANIONGAP  15   < > 12 9 11    < > = values in this interval not displayed.    Lipids No results for input(s): CHOL, TRIG, HDL, LABVLDL, LDLCALC, CHOLHDL in the last 168 hours.  Hematology Recent Labs  Lab 04/13/24 1856 04/13/24 1926 04/14/24 0541 04/15/24 0413  WBC 9.9  --  8.7 8.7  RBC 4.85  --  4.31 4.34  HGB 12.3 12.9 10.9* 11.2*  HCT 39.6 38.0 35.3* 35.6*  MCV 81.6  --  81.9 82.0  MCH 25.4*  --  25.3* 25.8*  MCHC 31.1  --  30.9 31.5  RDW 15.1  --  15.1 14.9  PLT 346  --  338 335   Thyroid  No results for input(s): TSH, FREET4 in the last 168 hours.  BNP Recent Labs  Lab 04/13/24 1856  BNP 21.7    DDimer No results for input(s): DDIMER in the last 168 hours.   Radiology  No results found.   Cardiac Studies   Patient Profile   73 y.o. female with a hx of chronic combined heart failure, COPD, hypertension, chronic respiratory failure, T2DM who we are consulted for heart failure  Assessment & Plan   Chronic combined heart failure: EF 20-25% on echo this admission.  Appears euvolemic on exam.  BNP 27.  RAP 3 on echo. - GDMT regimen as outpatient was spironolactone  12.5 mg daily, Farxiga  10 mg daily, bisoprolol  2.5 mg daily.  She previously failed lisinopril  due to hypotension.  Was also on torsemide  80 mg BID - Have restarted Farxiga  and bisoprolol .  Renal function continues to improve with holding torsemide , now back to baseline.  Would restart spironolactone  12.5 mg daily  today.  Will plan to discharge on torsemide  80 mg daily as needed.  Recommend monitoring daily weights and can take torsemide  if gains more than 3 pounds 1 day or 5 pounds 1 week   Paroxysmal atrial fibrillation: on amiodarone  100 mg daily.  QTc prolonged (547), would hold amiodarone . Continue Eliquis  5 mg twice daily.  Restart bisoprolol    AKI on CKD: Baseline creatinine around 2.  2.5 on admission, has improved with holding torsemide .  Creatinine back to baseline today   COPD: Needs to quit smoking  [facility] will sign off.   Medication Recommendations: Eliquis  5 mg twice daily, bisoprolol  2.5 mg daily, Farxiga  10 mg daily, spironolactone  12.5 mg daily.  Recommend torsemide  80 mg daily as needed, would monitor daily weights and can take torsemide  if gains more than 3 pounds in 1 day or 5 pounds in 1 week.  Would discontinue amiodarone  given QT prolongation.  Would discontinue aspirin  as she is on Eliquis . Other recommendations (labs, testing, etc): BMET in 1 week Follow up as an outpatient: Follow-up scheduled on 7/30   For questions or updates, please contact [facility] Please consult [email or web address] for contact info under     Signed, Lonni LITTIE Nanas, MD  04/17/2024, 9:16 AM

## 2024-04-17 NOTE — Progress Notes (Signed)
 Occupational Therapy Treatment Patient Details Name: [name] MRN: [id number] DOB: 1950/12/14 Today's Date: 04/17/2024   History of present illness Pt is a 73 y/o female admitted for acute on chronic hypoxic respiratory failure. was attendign a showing of [employer] at the [employer] when she felt short of breath; PMH significant for COPD Gold stage II with chronic hypoxic respite failure on nocturnal nasal cannula, chronic combined HFpEF and HFrEF, LVEF 20-25%, status post AICD, IIDM, cigarette smoker, HTN, HLD, lipoma, depression.   OT comments  Patient continues to demonstrate good gains with OT treatment with supervision for safety with self care and functional transfers. Patient was given printed HEP and yellow therapy band. Patient performed BUE HEP with min cues to perform correctly. Discharge recommendations continue to be appropriate. Acute OT to continue to follow to address established goals.       If plan is discharge home, recommend the following:  Assistance with cooking/housework;Assist for transportation   Equipment Recommendations  Other (comment) (rolling walker)    Recommendations for Other Services      Precautions / Restrictions Precautions Precautions: Fall Precaution/Restrictions Comments: monitor orthostatics, O2 (does not typically wear at home) Restrictions Weight Bearing Restrictions Per Provider Order: No       Mobility Bed Mobility Overal bed mobility: Modified Independent             General bed mobility comments: EOB at beginning of session and at end    Transfers Overall transfer level: Needs assistance Equipment used: Rolling walker (2 wheels), None Transfers: Sit to/from Stand Sit to Stand: Supervision           General transfer comment: supervision for safety     Balance Overall balance assessment: Mild deficits observed, not formally tested                                         ADL either  performed or assessed with clinical judgement   ADL Overall ADL's : Needs assistance/impaired     Grooming: Wash/dry hands;Wash/dry face;Oral care;Supervision/safety;Standing           Upper Body Dressing : Set up;Sitting   Lower Body Dressing: Supervision/safety;Sitting/lateral leans;Sit to/from stand   Toilet Transfer: Contact guard assist;Ambulation;Rolling walker (2 wheels);Regular Toilet                  Extremity/Trunk Assessment              Vision       Perception     Praxis     Communication Communication Communication: No apparent difficulties   Cognition Arousal: Alert Behavior During Therapy: WFL for tasks assessed/performed Cognition: No apparent impairments                               Following commands: Intact        Cueing   Cueing Techniques: Verbal cues  Exercises Exercises: General Upper Extremity General Exercises - Upper Extremity Shoulder Flexion: Strengthening, Both, 10 reps, Seated, Theraband Theraband Level (Shoulder Flexion): Level 1 (Yellow) Shoulder Horizontal ABduction: Strengthening, Both, 15 reps, Seated, Theraband Theraband Level (Shoulder Horizontal Abduction): Level 1 (Yellow) Elbow Flexion: Strengthening, Both, 15 reps, Seated, Theraband Theraband Level (Elbow Flexion): Level 1 (Yellow) Elbow Extension: Strengthening, Both, 15 reps, Seated, Theraband Theraband Level (Elbow Extension): Level 1 (Yellow)    Shoulder  Instructions       General Comments VSS    Pertinent Vitals/ Pain       Pain Assessment Pain Assessment: No/denies pain Pain Intervention(s): Monitored during session  Home Living                                          Prior Functioning/Environment              Frequency  Min 2X/week        Progress Toward Goals  OT Goals(current goals can now be found in the care plan section)  Progress towards OT goals: Progressing toward goals  Acute Rehab  OT Goals Patient Stated Goal: to go home OT Goal Formulation: With patient Time For Goal Achievement: 04/28/24 Potential to Achieve Goals: Good ADL Goals Pt Will Perform Lower Body Bathing: with modified independence;sit to/from stand Pt Will Transfer to Toilet: with modified independence;ambulating Pt/caregiver will Perform Home Exercise Program: Increased strength;Both right and left upper extremity;With theraband;Independently;With written HEP provided Additional ADL Goal #1: Pt to increase standing tolerance > 8 min during ADLs/mobility without need for seated rest break  Plan      Co-evaluation                 AM-PAC OT 6 Clicks Daily Activity     Outcome Measure   Help from another person eating meals?: None Help from another person taking care of personal grooming?: A Little Help from another person toileting, which includes using toliet, bedpan, or urinal?: A Little Help from another person bathing (including washing, rinsing, drying)?: A Little Help from another person to put on and taking off regular upper body clothing?: A Little Help from another person to put on and taking off regular lower body clothing?: A Little 6 Click Score: 19    End of Session Equipment Utilized During Treatment: Rolling walker (2 wheels)  OT Visit Diagnosis: Other abnormalities of gait and mobility (R26.89)   Activity Tolerance Patient tolerated treatment well   Patient Left in bed;with call bell/phone within reach;Other (comment) (seated on EOB)   Nurse Communication Mobility status        Time: 0722-0742 OT Time Calculation (min): 20 min  Charges: OT General Charges $OT Visit: 1 Visit OT Treatments $Self Care/Home Management : 8-22 mins  Dick Laine, OTA Acute Rehabilitation Services  Office [phone]   Jeb LITTIE Laine 04/17/2024, 12:22 PM

## 2024-04-17 NOTE — Discharge Summary (Addendum)
 Name: [name] MRN: [id number] DOB: 10/29/1950 73 y.o. PCP: Peggy Homans, MD  Date of Admission: 04/13/2024  6:46 PM Date of Discharge: 04/17/2024 Attending Physician: Dr. Mliss [name]  Discharge Diagnosis: 1. Principal Problem:   Acute on chronic systolic heart failure (HCC) Active Problems:   Chronic combined systolic and diastolic heart failure (HCC)   DM (diabetes mellitus) type II controlled with renal manifestation (HCC)   Paroxysmal atrial flutter (HCC)   CKD (chronic kidney disease) stage 4, GFR 15-29 ml/min (HCC)   Right ankle swelling   Acute on chronic hypoxic respiratory failure (HCC)   QT prolongation    Discharge Medications: Allergies as of 04/17/2024       Reactions   Actos [pioglitazone] Other (See Comments)   congestive heart failure   Naproxen  Other (See Comments)   500mg  dose made her sleep for two days   Rosiglitazone Hives   Metformin  And Related    Caused kidney problems    Tomato Itching   Hydrocodone -acetaminophen  Itching   Hydrocortisone Hives, Itching        Medication List     STOP taking these medications    amiodarone  100 MG tablet Commonly known as: PACERONE    aspirin  EC 81 MG tablet       TAKE these medications    albuterol  108 (90 Base) MCG/ACT inhaler Commonly known as: VENTOLIN  HFA Inhale 2 puffs into the lungs every 6 (six) hours as needed for wheezing or shortness of breath.   Anoro Ellipta  62.5-25 MCG/ACT Aepb Generic drug: umeclidinium-vilanterol INHALE 1 PUFF INTO THE LUNGS DAILY   apixaban  5 MG Tabs tablet Commonly known as: ELIQUIS  Take 1 tablet (5 mg total) by mouth 2 (two) times daily.   atorvastatin  80 MG tablet Commonly known as: LIPITOR  Take 1 tablet (80 mg total) by mouth at bedtime.   bisoprolol  5 MG tablet Commonly known as: ZEBETA  Take 0.5 tablets (2.5 mg total) by mouth daily.   calcium -vitamin D  500-200 MG-UNIT tablet Commonly known as: OSCAL WITH D Take 1 tablet by mouth 2  (two) times daily.   dapagliflozin  propanediol 10 MG Tabs tablet Commonly known as: Farxiga  Take 1 tablet (10 mg total) by mouth daily.   insulin  degludec 100 UNIT/ML FlexTouch Pen Commonly known as: TRESIBA  Inject 5 Units into the skin daily. What changed: when to take this   ketoconazole  2 % shampoo Commonly known as: NIZORAL  Apply 1 Application topically once a week.   levocetirizine 5 MG tablet Commonly known as: XYZAL  Take 1 tablet (5 mg total) by mouth at bedtime.   OXYGEN  Inhale 2 L into the lungs at bedtime.   Ozempic  (1 MG/DOSE) 4 MG/3ML Sopn Generic drug: Semaglutide  (1 MG/DOSE) Inject 1 mg into the skin once a week. Takes on Saturdays   potassium chloride  SA 20 MEQ tablet Commonly known as: KLOR-CON  M Take 20 mEq by mouth 2 (two) times daily.   spironolactone  25 MG tablet Commonly known as: ALDACTONE  Take 0.5 tablets (12.5 mg total) by mouth daily.   torsemide  20 MG tablet Commonly known as: DEMADEX  Take 4 tablets (80 mg total) by mouth daily as needed (Take if you gain 3 pounds in one day or 5 pounds in a week). What changed:  when to take this reasons to take this   triamcinolone  ointment 0.1 % Commonly known as: KENALOG  Apply 1 Application topically 2 (two) times daily. Mix with Eucerin cream        Disposition and follow-up:  [name] was discharged from [name] in Stable condition.  At the [name] follow up visit please address:  1.  Heart Failure Medications-   - Patient had AKI on CKD in the [name] likely due to diuretics and decreased PO intake. Spironolactone  12.5 mg started( down form 25 mg), Torsemide  80 mg( depending on weights- take if up 3 lbs in one day 5 lbs in one week), Farxiga  10 mg.   - Reassess torsemide  dosing in outpatient setting. Patient may benefit from a more regimented schedule of it       Paroxysmal atrial flutter  - Stopped amiodarone  per cardiology due to long Qtc      Medication  adherence-   Patient seems to not be taking some medications as prescribed. I did print out a list of medications for her to take and tried to go over them with her, but regimen may confuse patient still. Patient reports taking her Eliquis  once a day sometimes and then sometimes twice a day      Right upper lobe micronodules- Seen on chest x ray. With patient's smoking history should consider repeat x ray vs chest CT in 3-4 weeks   2.  Labs / imaging needed at time of follow-up: BMP for renal function, Repeat CXR or CT chest in 3-4 weeks outpatient   3.  Pending labs/ test needing follow-up: none   Follow-up Appointments:  Follow-up Information     [facility] Follow up.   Specialty: Home Health Services Why: [name] will provide home health services.  They will call you in the next 24-48 hours to set up services. Contact information: [address] [place] [address] [phone]         Peggy Homans, MD Follow up.   Specialty: Internal Medicine Why: Call the office and schedule a [name] follow up in the next 7-10 days. Contact information: [address] [place] [address] [phone]                  [name] Course by problem list:  [name] is a 73 y.o. female with PMH of HFrEF with ICD, COPD, CKD stage IV, T2DM, HTN, paroxysmal A-flutter, and tobacco use disorder  who presents with DOE and admitted for AoC hypoxic respiratory failure.    Acute on chronic hypoxic respiratory failure (home 2L O2 at bedtime) HFrEF (EF 35-40%) COPD  Patient presented with shortness of breath that had been getting worse over the last week. BNP and troponins were low. EKG did not show any acute ischemic change. Patient also did not have leukocytosis, fever or productive cough. By the time patient was admitted, after being on 2-4L of O2 [place] in the ED, patient was on room air. Patient does use 2L O2 at night as  her baseline. Patient did not fit volume overload, COPD exacerbation, or ACS picture. Wells score was low for DVT/PE. Shortness of breath likely multifactorial, with lung and heart pathology contributing. We restarted patient on home medications of torsemide  80 mg BID, spironolactone  12.5 mg daily, Farxiga  10 daily. Due to worsening kidney function, we discontinued patient's home torsemide  and spironolactone . Farxiga  was held for one day but added back on. Patient's volume status did improve as far as swelling, but still had some fine crackles at lung bases posteriorly likely due to lung disease. Echo was done in the setting worsening shortness of breath, and showed further  reduced EF of 20 to 25% and severely decreased left ventricular function. Cardiology was consulted and gave recommendations as far as GDMT for patient.   AKI on CKD Stage IV  Baseline appears to be 1.9-2.1. Patient's creatinine on admission was 2.54 on admission. We were able to give patient one dose of diuretics before creatinine went up. Likely prerenal etiology, as when torsemide  was discontinued, patient's creatinine normalized. Poor oral intake could also have played a part.   Orthostatic Hypotension  Orthostatic vitals were initially positive, although patient was asymptomatic. Did not give patient fluids in the setting of her heart failure. Ted hose were ordered but unfortunately never obtained for patient. Patient's repeat orthostatic vitals were not positive. Encouraged patient to have good PO intake.   Paroxysmal atrial flutter on anticoagulation  Patient is on bisoprolol  2.5 mg, amiodarone  100 mg and eliquis  5 mg BID at home. Patient was kept on bisoprolol  2.5 mg and eliquis  5 mg BID while in the [name]. Amiodarone  was discontinued by cardiology due to prolonged Qtc .   Right ankle swelling Patient has had recurrent issues with right ankle swelling after undergoing ORIF of her right ankle in 2020.  Recently seen in our  clinic for similar issue.  Reports that the swelling is now accompanied by some pain in his limited her mobility.  There was initially edema around right malleolus without TTP or signs of infection at presentation, though weight bearing was uncomfortable.  Xray with soft tissue swelling but no acute findings. Patient was given voltaren  gel. Weight-bearing pain and swelling significantly subsided by time of discharge; she was pleased.  Patient was able to work with physical therapy during hospitalization.   Right upper lobe micronodules Tobacco use disorder Chest x-ray shows a cluster of micronodules in the right upper lobe that could be infectious or inflammatory. Patient was afebrile and did not have any leukocytosis during hospitalization. Recommend repeat chest x-ray versus chest CT outpatient in 3 to 4 weeks. Continue smoking cessation counseling.   T2DM, controlled on insulin  therapy  A1c 7.5 in 03/2024. PTA on Tresiba  5 units daily, Farxiga  10 mg daily and Ozempic  1 mg weekly. Fasting glucose 110. Ozempic  can be resumed outpatient. Patient was kept on tresiba  5 units daily and farxiga  10 mg during    Discharge Subjective:  Patient is reportedly ready to go home today. Patient says that her breathing has gotten better and she does not feel as out of breath with exertion. Patient felt that her ankle feels better too and is not as tender to the touch. She has been able to move around a lot better she feels. She also said that her swelling in her legs has improved. No other complaints at this time.   Discharge Exam:   BP 137/64 (BP Location: Right Arm)   Pulse 84   Temp 98.2 F (36.8 C) (Oral)   Resp 16   Ht 5' 2 (1.575 m)   Wt 77.4 kg   SpO2 100%   BMI 31.21 kg/m  Discharge exam:  Constitutional: well-appearing  sitting at side of bed, in no acute distress Cardiovascular: regular rate and rhythm, no m/r/g Pulmonary/Chest: normal work of breathing on room air, fine crackles heard along  posterior bases bilaterally. Neurological: alert  MSK: trace bilaterally edema, non pitting. Right lateral malleolar fluctuant swelling, that is warmer temperature than right ankle. Darkened skin pigmentation over lateral side of right foot s/p ORIF   Skin: darkened skin thickening present on back of neck  and extensor surface of arms. Rough to the touch. Also has these patches on her back as well.  Pertinent Labs, Studies, and Procedures:     Latest Ref Rng & Units 04/15/2024    4:13 AM 04/14/2024    5:41 AM 04/13/2024    7:26 PM  CBC  WBC 4.0 - 10.5 K/uL 8.7  8.7    Hemoglobin 12.0 - 15.0 g/dL 88.7  89.0  87.0   Hematocrit 36.0 - 46.0 % 35.6  35.3  38.0   Platelets 150 - 400 K/uL 335  338         Latest Ref Rng & Units 04/17/2024    3:05 AM 04/16/2024    4:39 AM 04/15/2024    3:46 PM  CMP  Glucose 70 - 99 mg/dL 99  884  840   BUN 8 - 23 mg/dL 39  44  42   Creatinine 0.44 - 1.00 mg/dL 7.97  7.67  7.46   Sodium 135 - 145 mmol/L 139  140  138   Potassium 3.5 - 5.1 mmol/L 4.1  4.1  4.0   Chloride 98 - 111 mmol/L 101  100  96   CO2 22 - 32 mmol/L 27  31  30    Calcium  8.9 - 10.3 mg/dL 9.1  9.1  9.1     ECHOCARDIOGRAM COMPLETE Result Date: 04/14/2024    ECHOCARDIOGRAM REPORT   Patient Name:   BEE MARCHIANO Date of Exam: 04/14/2024 Medical Rec #:  [id number]    Height:       62.0 in Accession #:    [id number]   Weight:       165.0 lb Date of Birth:  [birth year]   BSA:          1.762 m Patient Age:    72 years     BP:           90/62 mmHg Patient Gender: F            HR:           87 bpm. Exam Location:  Inpatient Procedure: 2D Echo, 3D Echo, Color Doppler, Cardiac Doppler and Strain Analysis            (Both Spectral and Color Flow Doppler were utilized during            procedure). Indications:    Dyspnea  History:        Patient has prior history of Echocardiogram examinations, most                 recent 12/13/2023. CHF and Cardiomyopathy, Defibrillator, COPD,                  Signs/Symptoms:Shortness of Breath; Risk Factors:Hypertension,                 Diabetes, Dyslipidemia, Current Smoker and Sleep Apnea.  Sonographer:    Logan Shove RDCS Referring Phys: [phone] JULIE ANNE WILLIAMS IMPRESSIONS  1. Left ventricular ejection fraction, by estimation, is 20 to 25%. The left ventricle has severely decreased function. The left ventricle demonstrates global hypokinesis. There is mild left ventricular hypertrophy. Left ventricular diastolic parameters  are indeterminate.  2. Right ventricular systolic function is moderately reduced. The right ventricular size is normal.  3. Mild mitral valve regurgitation.  4. Tricuspid valve regurgitation is mild to moderate.  5. AV may be functionally bicuspid. The aortic valve is bicuspid. Aortic valve regurgitation is mild.  6. The inferior vena cava  is normal in size with greater than 50% respiratory variability, suggesting right atrial pressure of 3 mmHg. FINDINGS  Left Ventricle: Left ventricular ejection fraction, by estimation, is 20 to 25%. The left ventricle has severely decreased function. The left ventricle demonstrates global hypokinesis. The left ventricular internal cavity size was normal in size. There is mild left ventricular hypertrophy. Left ventricular diastolic parameters are indeterminate. Right Ventricle: The right ventricular size is normal. Right vetricular wall thickness was not assessed. Right ventricular systolic function is moderately reduced. Left Atrium: Left atrial size was normal in size. Right Atrium: Right atrial size was normal in size. Pericardium: Trivial pericardial effusion is present. Mitral Valve: There is mild thickening of the mitral valve leaflet(s). Mild to moderate mitral annular calcification. Mild mitral valve regurgitation. MV peak gradient, 7.4 mmHg. The mean mitral valve gradient is 2.0 mmHg. Tricuspid Valve: The tricuspid valve is normal in structure. Tricuspid valve regurgitation is mild to moderate. Aortic  Valve: AV may be functionally bicuspid. The aortic valve is bicuspid. Aortic valve regurgitation is mild. Aortic valve mean gradient measures 8.0 mmHg. Aortic valve peak gradient measures 13.4 mmHg. Aortic valve area, by VTI measures 1.83 cm. Pulmonic Valve: The pulmonic valve was normal in structure. Pulmonic valve regurgitation is not visualized. Aorta: The aortic root and ascending aorta are structurally normal, with no evidence of dilitation. Venous: The inferior vena cava is normal in size with greater than 50% respiratory variability, suggesting right atrial pressure of 3 mmHg. IAS/Shunts: No atrial level shunt detected by color flow Doppler.  LEFT VENTRICLE PLAX 2D LVIDd:         4.90 cm   Diastology LVIDs:         4.30 cm   LV e' medial:    3.48 cm/s LV PW:         1.10 cm   LV E/e' medial:  35.1 LV IVS:        1.20 cm   LV e' lateral:   4.13 cm/s LVOT diam:     2.30 cm   LV E/e' lateral: 29.5 LV SV:         69 LV SV Index:   39 LVOT Area:     4.15 cm                           3D Volume EF:                          3D EF:        22 %                          LV EDV:       141 ml                          LV ESV:       110 ml                          LV SV:        31 ml RIGHT VENTRICLE            IVC RV Basal diam:  2.90 cm    IVC diam: 1.30 cm RV S prime:     7.62 cm/s TAPSE (M-mode): 1.7 cm LEFT ATRIUM  Index        RIGHT ATRIUM           Index LA diam:      4.60 cm 2.61 cm/m   RA Area:     11.20 cm LA Vol (A2C): 49.8 ml 28.27 ml/m  RA Volume:   23.90 ml  13.57 ml/m LA Vol (A4C): 52.2 ml 29.63 ml/m  AORTIC VALVE AV Area (Vmax):    1.88 cm AV Area (Vmean):   1.79 cm AV Area (VTI):     1.83 cm AV Vmax:           183.00 cm/s AV Vmean:          134.000 cm/s AV VTI:            0.380 m AV Peak Grad:      13.4 mmHg AV Mean Grad:      8.0 mmHg LVOT Vmax:         82.80 cm/s LVOT Vmean:        57.600 cm/s LVOT VTI:          0.167 m LVOT/AV VTI ratio: 0.44  AORTA Ao Root diam: 3.20 cm Ao Asc diam:   3.30 cm MITRAL VALVE MV Area (PHT): 5.50 cm     SHUNTS MV Peak grad:  7.4 mmHg     Systemic VTI:  0.17 m MV Mean grad:  2.0 mmHg     Systemic Diam: 2.30 cm MV Vmax:       1.36 m/s MV Vmean:      70.5 cm/s MV Decel Time: 138 msec MV E velocity: 122.00 cm/s Vina Gull MD Electronically signed by Vina Gull MD Signature Date/Time: 04/14/2024/5:17:26 PM    Final    DG Ankle Complete Right Result Date: 04/13/2024 CLINICAL DATA:  Right ankle pain, initial encounter EXAM: RIGHT ANKLE - COMPLETE 3+ VIEW COMPARISON:  06/17/2022 FINDINGS: Postsurgical changes are noted in the distal tibia and fibula similar to that seen on the prior exam. No hardware failure is noted. Significant soft tissue swelling about the ankle is noted. No acute fracture or dislocation is seen. Calcaneal spurring is noted. IMPRESSION: Postsurgical changes with soft tissue swelling. No acute abnormality is noted. Electronically Signed   By: Oneil Devonshire M.D.   On: 04/13/2024 22:30   DG Chest Port 1 View Result Date: 04/13/2024 CLINICAL DATA:  Dyspnea EXAM: PORTABLE CHEST 1 VIEW COMPARISON:  chest x-ray 12/27/2023 FINDINGS: The heart is enlarged. Left-sided pacemaker is again seen. There is central pulmonary vascular congestion. Cluster of micro nodules seen in the right upper lobe. No pleural effusion or pneumothorax. No acute fractures. IMPRESSION: 1. Cardiomegaly with central pulmonary vascular congestion. 2. Cluster of micro nodules in the right upper lobe, possibly infectious/inflammatory. This can be further evaluated with chest CT. Electronically Signed   By: Greig Pique M.D.   On: 04/13/2024 20:59     Discharge Instructions: Discharge Instructions     Diet - low sodium heart healthy   Complete by: As directed    Increase activity slowly   Complete by: As directed        Signed: D'Mello, Tequan Redmon, DO 04/17/2024, 1:41 PM

## 2024-04-17 NOTE — Plan of Care (Signed)

## 2024-04-17 NOTE — Discharge Instructions (Addendum)
 You were hospitalized for shortness of breath. We started you back on your home inhalers and your home medications for your heart. Due to your kidney function being on the lower side we adjusted some of your medications. We got a new picture of your heart to see how it was working. We found that it was not moving and pumping as well as it had been a few months ago.We then decided to talk to the cardiologists to ask them for their recommendations on your medications. We also had you work with our physical therapists while you were here. Thank you for allowing us  to be part of your care.   We arranged for you to follow up at  Please follow up with your cardiologist    Please note these changes made to your medications:   *Please START taking:  Bisoprolol  2.5 mg daily  Eliquis  5 mg twice a day   Please take differently: Torsemide  80 mg once a day as needed based on weight changes . Please weigh yourself daily and only take if you go up by 3 pounds in 1 day or 5 pounds in 1 week Tresiba  5 units daily  Spironolactone  12.5 mg daily   *Please STOP taking:  Amiodarone  Aspirin     Please call our clinic if you have any questions or concerns, we may be able to help and keep you from a long and expensive emergency room wait. Our clinic and after hours phone number is [phone], the best time to call is Monday through Friday 9 am to 4 pm but there is always someone available 24/7 if you have an emergency. If you need medication refills please notify your pharmacy one week in advance and they will send us  a request.

## 2024-04-19 ENCOUNTER — Telehealth: Payer: Self-pay

## 2024-04-19 NOTE — Transitions of Care (Post Inpatient/ED Visit) (Signed)
   04/19/2024  Name: [name] MRN: [id number] DOB: 26-May-1951  Today's TOC FU Call Status: Today's TOC FU Call Status:: Unsuccessful Call (1st Attempt) Unsuccessful Call (1st Attempt) Date: 04/19/24  Attempted to reach the patient regarding the most recent Inpatient/ED visit.  Follow Up Plan: Additional outreach attempts will be made to reach the patient to complete the Transitions of Care (Post Inpatient/ED visit) call.   Signature Julian Lemmings, LPN [facility] Nurse Health Advisor Direct Dial [phone]

## 2024-04-22 ENCOUNTER — Ambulatory Visit (INDEPENDENT_AMBULATORY_CARE_PROVIDER_SITE_OTHER): Admitting: Student

## 2024-04-22 ENCOUNTER — Other Ambulatory Visit: Payer: Self-pay

## 2024-04-22 VITALS — BP 135/80 | HR 80 | Temp 97.9°F | Ht 62.0 in | Wt 178.4 lb

## 2024-04-22 DIAGNOSIS — R918 Other nonspecific abnormal finding of lung field: Secondary | ICD-10-CM | POA: Diagnosis not present

## 2024-04-22 DIAGNOSIS — I5042 Chronic combined systolic (congestive) and diastolic (congestive) heart failure: Secondary | ICD-10-CM | POA: Diagnosis not present

## 2024-04-22 DIAGNOSIS — J449 Chronic obstructive pulmonary disease, unspecified: Secondary | ICD-10-CM

## 2024-04-22 DIAGNOSIS — I5023 Acute on chronic systolic (congestive) heart failure: Secondary | ICD-10-CM | POA: Diagnosis not present

## 2024-04-22 NOTE — Patient Instructions (Addendum)
 Thank you, [name] for allowing us  to provide your care today. Today we discussed your hospital follow-up.  He has you have gained 8 pounds since your last hospital admission.  He is to take torsemide  20 mg daily until I see you back again in the clinic in 2 weeks.  Once your new blood work comes back I will give you a call and finalize your medication regimen.  But for now continue taking other medications as discussed.  Not take the spironolactone  until I speak to you again.   I am also sending you for a CT chest to follow-up on the nodules in your lungs.  I have ordered the following labs for you:  Lab Orders         Basic metabolic panel with GFR       Tests ordered today:    Referrals ordered today:   Referral Orders  No referral(s) requested today     I have ordered the following medication/changed the following medications:   Stop the following medications: There are no discontinued medications.   Start the following medications: No orders of the defined types were placed in this encounter.    Follow up: 4 months   Remember:   Should you have any questions or concerns please call the internal medicine clinic at [phone].   Drue Lisa Grow MD 04/22/2024, 10:41 AM   [facility]

## 2024-04-22 NOTE — Progress Notes (Unsigned)
 CC: Hospital follow-up  HPI:  [name] is a 73 y.o. female living with a history stated below and presents today for hospital follow-up. Please see problem based assessment and plan for additional details.  Past Medical History:  Diagnosis Date   Acute metabolic encephalopathy 07/26/2022   ATN (acute tubular necrosis) (HCC) 07/15/2014   Automatic implantable cardioverter-defibrillator in situ    Automatic implantable cardioverter-defibrillator in situ 05/04/2007   Qualifier: Diagnosis of  By: [place]     Blood transfusion without reported diagnosis    Cardiac defibrillator in situ    Atlas II VR (SJM) implanted by Dr Waddell   CHF (congestive heart failure) (HCC)    Chronic combined systolic and diastolic heart failure (HCC)    a. EF 35-40% in past;  b. Echo 7/13:  EF 45-50%, Gr 2 diast dysfn, mild AI, mild MAC, trivial MR, mild LAE, PASP 47.   Chronic ulcer of leg (HCC)    04-09-15 resolved-not a problem.   Colon polyps 04/12/2013   Rectosigmoid polyp   COPD (chronic obstructive pulmonary disease) (HCC)    O2 at night   Depression    Diabetes mellitus    Diabetes mellitus, type 2 (HCC) 04/03/2012   HIGH RISK FEET.. Please have patient take shoes and socks off every visit for visual foot inspection.     Eczema    Elevated alkaline phosphatase level    GGT and 5'nucleotidase 8/13 normal   Health care maintenance 01/22/2013   Surgically absent cervix- no pap needed (Path report 07/2000: uterine body with attached bilateral  adnexa and separate cervix.)   History of oxygen  administration    oxygen  @ 2 l/m nasally bedtime 24/7   HTN (hypertension)    Hx of cardiac cath    a. LHC 2003 normal;  b. LHC 6/13:  Mild calcification in the LM, o/w normal coronary arteries, EF 45%.    Hyperkalemia 08/08/2017   Hyperlipidemia    Elevated triglyceride in 2019   Hyperlipidemia associated with type 2 diabetes mellitus (HCC) 05/25/2007   Qualifier: Diagnosis of  By: Gladis  FNP, [place]     Hyperthyroidism, subclinical    HYPERTHYROIDISM, SUBCLINICAL 05/06/2009   Qualifier: Diagnosis of  By: Rolan Hough     Implantable cardioverter-defibrillator (ICD) generator end of life    Lipoma    Need for hepatitis C screening test 04/25/2019   NICM (nonischemic cardiomyopathy) (HCC)    Obesity    On home oxygen  therapy    2L; 24/7 (10/11/2016)   Post menopausal syndrome    Shortness of breath 07/14/2017   Skin rash 07/12/2018   Sleep apnea    pt denies 04/12/2013    Current Outpatient Medications on File Prior to Visit  Medication Sig Dispense Refill   albuterol  (VENTOLIN  HFA) 108 (90 Base) MCG/ACT inhaler Inhale 2 puffs into the lungs every 6 (six) hours as needed for wheezing or shortness of breath. 8 g 2   ANORO ELLIPTA  62.5-25 MCG/ACT AEPB INHALE 1 PUFF INTO THE LUNGS DAILY 60 each 2   apixaban  (ELIQUIS ) 5 MG TABS tablet Take 1 tablet (5 mg total) by mouth 2 (two) times daily. 60 tablet 2   atorvastatin  (LIPITOR ) 80 MG tablet Take 1 tablet (80 mg total) by mouth at bedtime. 90 tablet 3   bisoprolol  (ZEBETA ) 5 MG tablet Take 0.5 tablets (2.5 mg total) by mouth daily. 15 tablet 0   calcium -vitamin D  (OSCAL WITH D) 500-200 MG-UNIT tablet Take 1 tablet by  mouth 2 (two) times daily. 90 tablet 6   dapagliflozin  propanediol (FARXIGA ) 10 MG TABS tablet Take 1 tablet (10 mg total) by mouth daily. 90 tablet 1   insulin  degludec (TRESIBA ) 100 UNIT/ML FlexTouch Pen Inject 5 Units into the skin daily.     ketoconazole  (NIZORAL ) 2 % shampoo Apply 1 Application topically once a week.     levocetirizine (XYZAL ) 5 MG tablet Take 1 tablet (5 mg total) by mouth at bedtime. (Patient not taking: Reported on 04/13/2024) 90 tablet 3   OXYGEN  Inhale 2 L into the lungs at bedtime.     OZEMPIC , 1 MG/DOSE, 4 MG/3ML SOPN Inject 1 mg into the skin once a week. Takes on Saturdays     potassium chloride  SA (KLOR-CON  M) 20 MEQ tablet Take 20 mEq by mouth 2 (two) times daily.      spironolactone  (ALDACTONE ) 25 MG tablet Take 0.5 tablets (12.5 mg total) by mouth daily. 30 tablet 0   torsemide  (DEMADEX ) 20 MG tablet Take 4 tablets (80 mg total) by mouth daily as needed (Take if you gain 3 pounds in one day or 5 pounds in a week).     triamcinolone  ointment (KENALOG ) 0.1 % Apply 1 Application topically 2 (two) times daily. Mix with Eucerin cream     No current facility-administered medications on file prior to visit.    Family History  Problem Relation Age of Onset   Stroke Mother    Seizures Father    Heart disease Father    Diabetes Sister    Asthma Maternal Aunt        aunts   Asthma Maternal Uncle        uncles   Heart disease Paternal Aunt        aunts   Heart disease Paternal Uncle        uncles   Heart disease Maternal Aunt        aunts   Heart disease Maternal Uncle        uncles   Heart disease Maternal Grandfather    Breast cancer Daughter    Breast cancer Cousin    Asthma Grandchild    Colon cancer Neg Hx    Colon polyps Neg Hx    Esophageal cancer Neg Hx    Kidney disease Neg Hx    Gallbladder disease Neg Hx     Social History   Socioeconomic History   Marital status: Significant Other    Spouse name: Not on file   Number of children: 3   Years of education: 11   Highest education level: 11th grade  Occupational History   Occupation: disabled  Tobacco Use   Smoking status: Every Day    Current packs/day: 0.50    Average packs/day: 0.5 packs/day for 45.0 years (22.5 ttl pk-yrs)    Types: Cigarettes   Smokeless tobacco: Never   Tobacco comments:    currently smoking  10 cigs per day  Vaping Use   Vaping status: Never Used  Substance and Sexual Activity   Alcohol use: Not Currently    Alcohol/week: 3.0 standard drinks of alcohol    Types: 3 Shots of liquor per week   Drug use: No   Sexual activity: Yes    Partners: Male    Birth control/protection: Surgical  Other Topics Concern   Not on file  Social History Narrative    ** Merged History Encounter **       Married   Patient enjoys going to  the beach, church and spending time with family   Social Drivers of Health   Financial Resource Strain: Low Risk  (01/09/2024)   Overall Financial Resource Strain (CARDIA)    Difficulty of Paying Living Expenses: Not hard at all  Food Insecurity: No Food Insecurity (04/14/2024)   Hunger Vital Sign    Worried About Running Out of Food in the Last Year: Never true    Ran Out of Food in the Last Year: Never true  Transportation Needs: No Transportation Needs (04/14/2024)   PRAPARE - Administrator, Civil Service (Medical): No    Lack of Transportation (Non-Medical): No  Physical Activity: Inactive (01/09/2024)   Exercise Vital Sign    Days of Exercise per Week: 0 days    Minutes of Exercise per Session: 0 min  Stress: No Stress Concern Present (01/09/2024)   [employer] of Occupational Health - Occupational Stress Questionnaire    Feeling of Stress : Not at all  Social Connections: Moderately Integrated (04/14/2024)   Social Connection and Isolation Panel    Frequency of Communication with Friends and Family: More than three times a week    Frequency of Social Gatherings with Friends and Family: More than three times a week    Attends Religious Services: More than 4 times per year    Active Member of [employer] or Organizations: Yes    Attends Banker Meetings: More than 4 times per year    Marital Status: Widowed  Intimate Partner Violence: Not At Risk (04/14/2024)   Humiliation, Afraid, Rape, and Kick questionnaire    Fear of Current or Ex-Partner: No    Emotionally Abused: No    Physically Abused: No    Sexually Abused: No    Review of Systems: ROS negative except for what is noted on the assessment and plan.  Vitals:   04/22/24 0956  BP: 135/80  Pulse: 80  Temp: 97.9 F (36.6 C)  TempSrc: Oral  SpO2: 93%  Weight: 178 lb 6.4 oz (80.9 kg)  Height: 5' 2 (1.575 m)    Physical  Exam: Constitutional: well-appearing *** sitting in ***, in no acute distress HENT: normocephalic atraumatic, mucous membranes moist Eyes: conjunctiva non-erythematous Cardiovascular: regular rate and rhythm, no m/r/g Pulmonary/Chest: normal work of breathing on room air, lungs clear to auscultation bilaterally Abdominal: soft, non-tender, non-distended MSK: normal bulk and tone Neurological: alert & oriented x 3, no focal deficit Skin: warm and dry Psych: normal mood and behavior  Assessment & Plan:   No problem-specific Assessment & Plan notes found for this encounter.     Patient {[id number]::discussed with,seen with} Dr. {[id number]::Tpoopjfd,Z. Hoffman,Mullen,Narendra,Vincent,Guilloud,Lau,Machen}    Drue Grow, M.D [facility] Phone: [phone] Date 04/22/2024 Time 10:08 AM

## 2024-04-22 NOTE — Transitions of Care (Post Inpatient/ED Visit) (Signed)
   04/22/2024  Name: [name] MRN: [id number] DOB: [birth year]  Today's TOC FU Call Status: Today's TOC FU Call Status:: Unsuccessful Call (1st Attempt) Unsuccessful Call (1st Attempt) Date: 04/19/24  Attempted to reach the patient regarding the most recent Inpatient/ED visit.  Follow Up Plan: No further outreach attempts will be made at this time. We have been unable to contact the patient. Patient already seen in office Signature Julian Lemmings, LPN [facility] Nurse Health Advisor Direct Dial [phone]

## 2024-04-23 ENCOUNTER — Other Ambulatory Visit: Payer: Self-pay | Admitting: Student

## 2024-04-23 ENCOUNTER — Telehealth: Payer: Self-pay

## 2024-04-23 DIAGNOSIS — M810 Age-related osteoporosis without current pathological fracture: Secondary | ICD-10-CM | POA: Diagnosis not present

## 2024-04-23 DIAGNOSIS — M48061 Spinal stenosis, lumbar region without neurogenic claudication: Secondary | ICD-10-CM | POA: Diagnosis not present

## 2024-04-23 DIAGNOSIS — N179 Acute kidney failure, unspecified: Secondary | ICD-10-CM | POA: Diagnosis not present

## 2024-04-23 DIAGNOSIS — E1143 Type 2 diabetes mellitus with diabetic autonomic (poly)neuropathy: Secondary | ICD-10-CM | POA: Diagnosis not present

## 2024-04-23 DIAGNOSIS — K3184 Gastroparesis: Secondary | ICD-10-CM | POA: Diagnosis not present

## 2024-04-23 DIAGNOSIS — I429 Cardiomyopathy, unspecified: Secondary | ICD-10-CM | POA: Diagnosis not present

## 2024-04-23 DIAGNOSIS — E785 Hyperlipidemia, unspecified: Secondary | ICD-10-CM | POA: Diagnosis not present

## 2024-04-23 DIAGNOSIS — L2089 Other atopic dermatitis: Secondary | ICD-10-CM | POA: Diagnosis not present

## 2024-04-23 DIAGNOSIS — R918 Other nonspecific abnormal finding of lung field: Secondary | ICD-10-CM | POA: Diagnosis not present

## 2024-04-23 DIAGNOSIS — I480 Paroxysmal atrial fibrillation: Secondary | ICD-10-CM | POA: Diagnosis not present

## 2024-04-23 DIAGNOSIS — J449 Chronic obstructive pulmonary disease, unspecified: Secondary | ICD-10-CM | POA: Diagnosis not present

## 2024-04-23 DIAGNOSIS — I083 Combined rheumatic disorders of mitral, aortic and tricuspid valves: Secondary | ICD-10-CM | POA: Diagnosis not present

## 2024-04-23 DIAGNOSIS — L308 Other specified dermatitis: Secondary | ICD-10-CM | POA: Diagnosis not present

## 2024-04-23 DIAGNOSIS — F17210 Nicotine dependence, cigarettes, uncomplicated: Secondary | ICD-10-CM | POA: Diagnosis not present

## 2024-04-23 DIAGNOSIS — J9621 Acute and chronic respiratory failure with hypoxia: Secondary | ICD-10-CM | POA: Diagnosis not present

## 2024-04-23 DIAGNOSIS — I4892 Unspecified atrial flutter: Secondary | ICD-10-CM | POA: Diagnosis not present

## 2024-04-23 DIAGNOSIS — K760 Fatty (change of) liver, not elsewhere classified: Secondary | ICD-10-CM | POA: Diagnosis not present

## 2024-04-23 DIAGNOSIS — N184 Chronic kidney disease, stage 4 (severe): Secondary | ICD-10-CM | POA: Diagnosis not present

## 2024-04-23 DIAGNOSIS — R1314 Dysphagia, pharyngoesophageal phase: Secondary | ICD-10-CM | POA: Diagnosis not present

## 2024-04-23 DIAGNOSIS — I5042 Chronic combined systolic (congestive) and diastolic (congestive) heart failure: Secondary | ICD-10-CM | POA: Diagnosis not present

## 2024-04-23 DIAGNOSIS — E1122 Type 2 diabetes mellitus with diabetic chronic kidney disease: Secondary | ICD-10-CM | POA: Diagnosis not present

## 2024-04-23 DIAGNOSIS — E042 Nontoxic multinodular goiter: Secondary | ICD-10-CM | POA: Diagnosis not present

## 2024-04-23 DIAGNOSIS — I130 Hypertensive heart and chronic kidney disease with heart failure and stage 1 through stage 4 chronic kidney disease, or unspecified chronic kidney disease: Secondary | ICD-10-CM | POA: Diagnosis not present

## 2024-04-23 LAB — BASIC METABOLIC PANEL WITH GFR
BUN/Creatinine Ratio: 15 (ref 12–28)
BUN: 27 mg/dL (ref 8–27)
CO2: 16 mmol/L — ABNORMAL LOW (ref 20–29)
Calcium: 9.7 mg/dL (ref 8.7–10.3)
Chloride: 107 mmol/L — ABNORMAL HIGH (ref 96–106)
Creatinine, Ser: 1.81 mg/dL — ABNORMAL HIGH (ref 0.57–1.00)
Glucose: 109 mg/dL — ABNORMAL HIGH (ref 70–99)
Potassium: 5.7 mmol/L — ABNORMAL HIGH (ref 3.5–5.2)
Sodium: 142 mmol/L (ref 134–144)
eGFR: 30 mL/min/1.73 — ABNORMAL LOW (ref >59)

## 2024-04-23 NOTE — Progress Notes (Signed)
 Care Guide Pharmacy Note  04/23/2024 Name: [name] MRN: [id number] DOB: 27-May-1951  Referred By: [name], MD Reason for referral: Complex Care Management (Outreach to schedule with Pharm d and RNCM )   [name] is a 73 y.o. year old female who is a primary care patient of [name], MD.  [name] was referred to the pharmacist for assistance related to: DMII  Successful contact was made with the patient to discuss pharmacy services including being ready for the pharmacist to call at least 5 minutes before the scheduled appointment time and to have medication bottles and any blood pressure readings ready for review. The patient agreed to meet with the pharmacist via telephone visit on (date/time).05/09/2024  Jeoffrey Buffalo , RMA     [name]  [facility], [facility] Guide  Direct Dial: [phone]  Website: [email or web address]

## 2024-04-23 NOTE — Progress Notes (Signed)
 I spoke with [name] today regarding her recent BMP results, which were notable for hyperkalemia (K? 5.7) and a decreased bicarbonate level of 16. Her serum creatinine has improved from 2.13 in April to 1.81, which is reassuring.  Of note, she is currently taking potassium supplements, which may be contributing to her elevated potassium level. To determine whether this is a true lab abnormality or medication-induced hyperkalemia, we discussed repeating the BMP prior to making any medication changes.  The patient agreed to return to the lab tomorrow for repeat labs. In the meantime, she has been advised to hold both her potassium supplement and spironolactone . She voiced understanding of this plan.  If her repeat labs confirm persistent hyperkalemia, we will consider discontinuing the potassium supplement and re-evaluating the need for spironolactone .  Peggy Lisa Grow MD 04/23/2024, 5:20 PM

## 2024-04-23 NOTE — Assessment & Plan Note (Signed)
 Additionally, during her hospitalization, multiple lung nodules were incidentally noted. Follow-up chest imaging was recommended at that time. We will proceed with a non-contrast CT chest today for further evaluation. - CT chest without contrast

## 2024-04-23 NOTE — Assessment & Plan Note (Addendum)
 Ms Scritchfield  presented to the clinic for hospital follow-up. She was recently admitted for shortness of breath and diagnosed with acute on chronic systolic heart failure, along with a component of COPD exacerbation. She was discharged home in stable condition on July 16. At today's visit, she denies any dyspnea or orthopnea. On physical exam, she appears euvolemic, with no signs of lower extremity edema, though crackles were noted on lung auscultation. She was warm and dry. However, her weight has increased from 170 lbs at discharge to 178 lbs today. During her hospitalization, both torsemide  and spironolactone  were held due to an episode of acute kidney injury. At discharge, she was instructed to resume torsemide  if she gained more than 3 lbs in a day or 5 lbs in a week. While she appeared to understand these instructions at discharge, she was unable to weigh herself at home and therefore did not resume the medication. I spent approximately 15 minutes today reviewing her medications with her to confirm correct dosages and usage. In light of her weight gain, I believe it is appropriate to resume torsemide  20 mg daily, while continuing to hold spironolactone  until we have her BMP results to rule out any electrolyte abnormalities. I will also consult the [facility] to support the patient with medication management and adherence, as this remains a significant challenge. -BMP - Resume torsemide  20 mg daily - Hold spironolactone  until BMP is back

## 2024-04-23 NOTE — Progress Notes (Signed)
 Complex Care Management Note  Care Guide Note 04/23/2024 Name: JAYLEEN SCAGLIONE MRN: [id number] DOB: 1950/10/20  JENETTA WEASE is a 73 y.o. year old female who sees [name], MD for primary care. I reached out to Arland [name] by phone today to offer complex care management services.  Ms. Karan was given information about Complex Care Management services today including:   The Complex Care Management services include support from the care team which includes your Nurse Care Manager, Clinical Social Worker, or Pharmacist.  The Complex Care Management team is here to help remove barriers to the health concerns and goals most important to you. Complex Care Management services are voluntary, and the patient may decline or stop services at any time by request to their care team member.   Complex Care Management Consent Status: Patient agreed to services and verbal consent obtained.   Follow up plan:  Telephone appointment with complex care management team member scheduled for:  05/03/2024  Encounter Outcome:  Patient Scheduled  Jeoffrey Buffalo , RMA     [place]  [facility], [facility] Guide  Direct Dial: [phone]  Website: [email or web address]

## 2024-04-24 ENCOUNTER — Other Ambulatory Visit: Payer: Self-pay | Admitting: Student

## 2024-04-24 ENCOUNTER — Other Ambulatory Visit

## 2024-04-24 DIAGNOSIS — I5042 Chronic combined systolic (congestive) and diastolic (congestive) heart failure: Secondary | ICD-10-CM | POA: Diagnosis not present

## 2024-04-24 LAB — BASIC METABOLIC PANEL WITH GFR
BUN/Creatinine Ratio: 14 (ref 12–28)
BUN: 34 mg/dL — ABNORMAL HIGH (ref 8–27)
CO2: 22 mmol/L (ref 20–29)
Calcium: 9.3 mg/dL (ref 8.7–10.3)
Chloride: 104 mmol/L (ref 96–106)
Creatinine, Ser: 2.39 mg/dL — ABNORMAL HIGH (ref 0.57–1.00)
Glucose: 134 mg/dL — ABNORMAL HIGH (ref 70–99)
Potassium: 5.9 mmol/L — ABNORMAL HIGH (ref 3.5–5.2)
Sodium: 141 mmol/L (ref 134–144)
eGFR: 21 mL/min/1.73 — ABNORMAL LOW (ref >59)

## 2024-04-24 MED ORDER — LOKELMA 10 G PO PACK: 10.0000 g | PACK | Freq: Three times a day (TID) | ORAL | 0 refills | Status: DC

## 2024-04-24 NOTE — Progress Notes (Signed)
 [name] returned to the office for a repeat BMP due to recent concerns for hypokalemia. However, today's results are now concerning for hyperkalemia, with a potassium level of 5.9 mmol/L (up from 5.7 mmol/L yesterday), and a rising serum creatinine of 2.39 mg/dL (up from 8.18 mg/dL), which is worrisome. I contacted the patient to confirm she is not currently taking potassium supplements or spironolactone , and she verified that both have been held as instructed. Given the upward trend in creatinine and persistent hyperkalemia, I have made the following adjustments to her regimen: - Continue to hold potassium supplements - Continue to hold spironolactone  - Discontinue torsemide  20 mg daily - Initiate Lokelma  10 g three times daily for 3 doses  The patient is agreeable to returning to the clinic on Monday for a repeat BMP. Given the complexity of her clinical picture, including worsening renal function and hyperkalemia in the setting of heart failure, I believe it is appropriate to collaborate with her cardiology team to reassess and optimize her medication regimen moving forward.  Drue Lisa Grow MD 04/24/2024, 6:01 PM

## 2024-04-24 NOTE — Progress Notes (Signed)
 Internal Medicine Clinic Attending  Case discussed with the resident at the time of the visit.  We reviewed the resident's history and exam and pertinent patient test results.  I agree with the assessment, diagnosis, and plan of care documented in the resident's note.   Complex patient.  BMP notable for hyperkalemia - patient instructed to hold potassium supplements and spironolactone  and return 7/23 for a repeat BMP (may have been hemolyzed)

## 2024-04-24 NOTE — Addendum Note (Signed)
 Addended by: ANTONE DWAYNE SAILOR on: 04/24/2024 10:54 AM   Modules accepted: Orders

## 2024-04-28 DIAGNOSIS — I509 Heart failure, unspecified: Secondary | ICD-10-CM | POA: Diagnosis not present

## 2024-04-28 DIAGNOSIS — J449 Chronic obstructive pulmonary disease, unspecified: Secondary | ICD-10-CM | POA: Diagnosis not present

## 2024-04-30 ENCOUNTER — Telehealth (HOSPITAL_COMMUNITY): Payer: Self-pay

## 2024-04-30 NOTE — Telephone Encounter (Signed)
 Called to confirm/remind patient of their appointment at the [facility] on 05/01/24.   Appointment:   [] Confirmed  [] Left mess   [] No answer/No voice mail  [x] VM Full/unable to leave message  [] Phone not in service

## 2024-04-30 NOTE — Progress Notes (Incomplete)
 ADVANCED HF CLINIC NOTE   PCP: [name], MD HF Cardiologist:  Dr. Cherrie   Reason for Visit: [name] F/u for Symptomatic Hypotension Necessitating Reduction of HF GDMT; F/u for Chronic Systolic Heart Failure/NICM   HPI: [name] is a 73 y.o. with a history of DM2, COPD, HTN, chronic respiratory failure, tobacco abuse, chronic systolic s/p St Jude ICD, EF has 25-30% for at least 5 years. Last LHC 2017, normal coronaries.    Admitted 9/23 with sepsis 2/2 cellulitis of RLE. Underwent TEE to r/o endocarditis. TEE (9/23) showed LVEF 30%, moderate TR, no valvular vegetation or device vegetation. Admitted a couple weeks later with hypothermia and hypoglycemia. Insulin  stopped and SGLT2i continued. Entresto  and spiro held due to low BP.   Admitted 10/23 with A/C HFrEF, AECOPD, and new onset A flutter RVR. Given steroinds + inhalers. Started on amio drip + heparin  drip. Diuresed with IV lasix .  Hospital course complicated by AKI. Renal US  negative for hydronephrosis. Had RHC prior to discharge. Low filing pressures and CI 2.5. Discharge weight 197 pounds.   Seen in ED 03/09/23 with SOB. BNP normal, felt to be COPD exac, improved after duonebs.  Seen in clinic in 8/24. Volume overloaded. Furoscix  prescribed. Diuresed well.   Echo 3/25, EF 35-40%, no significant valvular dz, RV normal   Seen by PCP on 12/26/23 and was hypertensive. Lisinopril  2.5 mg daily was added to regimen.   She presented to the ED the next day on 3/26 and was admitted for syncope. Found to be hypotensive in the ED, SBP in the 70s. Head CT/MRI and carotid dopplers unremarkable. Device interrogation showed no VT. Syncope felt 2/2 hypotension. Bidil  and lisinopril  were discontinued. Continued on Farxiga , Spiro, Bisoprolol  and torsemide .   She presents today for post hospital f/u. She reports doing well. Feels better. BP improved, 110/62. Denies further syncope/ near syncope. No lightheadedness. Denies dyspnea and CP.  Device interrogation today shows normal impedence. Trends c/w euvolemia. No VT/VF. EKG shows NSR 77 bpm.    Since being discharged, she has had f/u labs, done at PCP office. This was just done last wk. Labs personally reviewed. SCr improved from hospitalization and nearing baseline, down from 2.4>>2.0. K 4.1.    Still smoking 3-4 cigs/day.   Cardiac Testing  - RHC (11/23):  RA = 9 RV = 55/10 PA = 44/11 (23) PCW = 9 Fick cardiac output/index = 6.1/3.2 Thermo CO/CI = 4.8/2.5 PVR = 2.3 (Fick) 2.9 (TD) Ao sat = 95% PA sat = 67%, 67% PAPi = 5.0  - Echo (10/23): EF 25-30%, severe LV dysfunction, RV low/normal, mild to moderate MR with moderate MAC, severe TR - TEE (9/23): EF 30%, moderate TR, no valvular vegetation or device vegetation.  - Echo (10/23): EF 25-30% RV low normal. RA/LA severely dilated. D Sahped septum - Echo (9/22): EF 20-25% RV normal , LA severely reduced  - Echo (2018): EF 25-30% Grade IDD - LHC 2017 Normal Coronaries.    ROS: All systems negative except as listed in HPI, PMH and Problem List.  SH:  Social History   Socioeconomic History   Marital status: Significant Other    Spouse name: Not on file   Number of children: 3   Years of education: 68   Highest education level: 11th grade  Occupational History   Occupation: disabled  Tobacco Use   Smoking status: Every Day    Current packs/day: 0.50    Average packs/day: 0.5 packs/day for 45.0 years (22.5  ttl pk-yrs)    Types: Cigarettes   Smokeless tobacco: Never   Tobacco comments:    currently smoking  10 cigs per day  Vaping Use   Vaping status: Never Used  Substance and Sexual Activity   Alcohol use: Not Currently    Alcohol/week: 3.0 standard drinks of alcohol    Types: 3 Shots of liquor per week   Drug use: No   Sexual activity: Yes    Partners: Male    Birth control/protection: Surgical  Other Topics Concern   Not on file  Social History Narrative   ** Merged History Encounter **        Married   Patient enjoys going to [employer], church and spending time with family   Social Drivers of Corporate investment banker Strain: Low Risk  (01/09/2024)   Overall Financial Resource Strain (CARDIA)    Difficulty of Paying Living Expenses: Not hard at all  Food Insecurity: No Food Insecurity (04/14/2024)   Hunger Vital Sign    Worried About Running Out of Food in the Last Year: Never true    Ran Out of Food in the Last Year: Never true  Transportation Needs: No Transportation Needs (04/14/2024)   PRAPARE - Administrator, Civil Service (Medical): No    Lack of Transportation (Non-Medical): No  Physical Activity: Inactive (01/09/2024)   Exercise Vital Sign    Days of Exercise per Week: 0 days    Minutes of Exercise per Session: 0 min  Stress: No Stress Concern Present (01/09/2024)   [employer] of Occupational Health - Occupational Stress Questionnaire    Feeling of Stress : Not at all  Social Connections: Moderately Integrated (04/14/2024)   Social Connection and Isolation Panel    Frequency of Communication with Friends and Family: More than three times a week    Frequency of Social Gatherings with Friends and Family: More than three times a week    Attends Religious Services: More than 4 times per year    Active Member of [employer] or Organizations: Yes    Attends Banker Meetings: More than 4 times per year    Marital Status: Widowed  Intimate Partner Violence: Not At Risk (04/14/2024)   Humiliation, Afraid, Rape, and Kick questionnaire    Fear of Current or Ex-Partner: No    Emotionally Abused: No    Physically Abused: No    Sexually Abused: No   FH:  Family History  Problem Relation Age of Onset   Stroke Mother    Seizures Father    Heart disease Father    Diabetes Sister    Asthma Maternal Aunt        aunts   Asthma Maternal Uncle        uncles   Heart disease Paternal Aunt        aunts   Heart disease Paternal Uncle        uncles    Heart disease Maternal Aunt        aunts   Heart disease Maternal Uncle        uncles   Heart disease Maternal Grandfather    Breast cancer Daughter    Breast cancer Cousin    Asthma Grandchild    Colon cancer Neg Hx    Colon polyps Neg Hx    Esophageal cancer Neg Hx    Kidney disease Neg Hx    Gallbladder disease Neg Hx    Past Medical History:  Diagnosis Date   Acute metabolic encephalopathy 07/26/2022   ATN (acute tubular necrosis) (HCC) 07/15/2014   Automatic implantable cardioverter-defibrillator in situ    Automatic implantable cardioverter-defibrillator in situ 05/04/2007   Qualifier: Diagnosis of  By: Kearney [place]     Blood transfusion without reported diagnosis    Cardiac defibrillator in situ    Atlas II VR (SJM) implanted by Dr Waddell   CHF (congestive heart failure) (HCC)    Chronic combined systolic and diastolic heart failure (HCC)    a. EF 35-40% in past;  b. Echo 7/13:  EF 45-50%, Gr 2 diast dysfn, mild AI, mild MAC, trivial MR, mild LAE, PASP 47.   Chronic ulcer of leg (HCC)    04-09-15 resolved-not a problem.   Colon polyps 04/12/2013   Rectosigmoid polyp   COPD (chronic obstructive pulmonary disease) (HCC)    O2 at night   Depression    Diabetes mellitus    Diabetes mellitus, type 2 (HCC) 04/03/2012   HIGH RISK FEET.. Please have patient take shoes and socks off every visit for visual foot inspection.     Eczema    Elevated alkaline phosphatase level    GGT and 5'nucleotidase 8/13 normal   Health care maintenance 01/22/2013   Surgically absent cervix- no pap needed (Path report 07/2000: uterine body with attached bilateral  adnexa and separate cervix.)   History of oxygen  administration    oxygen  @ 2 l/m nasally bedtime 24/7   HTN (hypertension)    Hx of cardiac cath    a. LHC 2003 normal;  b. LHC 6/13:  Mild calcification in the LM, o/w normal coronary arteries, EF 45%.    Hyperkalemia 08/08/2017   Hyperlipidemia    Elevated triglyceride in 2019    Hyperlipidemia associated with type 2 diabetes mellitus (HCC) 05/25/2007   Qualifier: Diagnosis of  By: Gladis FNP, Nykedtra     Hyperthyroidism, subclinical    HYPERTHYROIDISM, SUBCLINICAL 05/06/2009   Qualifier: Diagnosis of  By: Rolan Hough     Implantable cardioverter-defibrillator (ICD) generator end of life    Lipoma    Need for hepatitis C screening test 04/25/2019   NICM (nonischemic cardiomyopathy) (HCC)    Obesity    On home oxygen  therapy    2L; 24/7 (10/11/2016)   Post menopausal syndrome    Shortness of breath 07/14/2017   Skin rash 07/12/2018   Sleep apnea    pt denies 04/12/2013   Current Outpatient Medications  Medication Sig Dispense Refill   albuterol  (VENTOLIN  HFA) 108 (90 Base) MCG/ACT inhaler Inhale 2 puffs into the lungs every 6 (six) hours as needed for wheezing or shortness of breath. 8 g 2   ANORO ELLIPTA  62.5-25 MCG/ACT AEPB INHALE 1 PUFF INTO THE LUNGS DAILY 60 each 2   apixaban  (ELIQUIS ) 5 MG TABS tablet Take 1 tablet (5 mg total) by mouth 2 (two) times daily. 60 tablet 2   atorvastatin  (LIPITOR ) 80 MG tablet Take 1 tablet (80 mg total) by mouth at bedtime. 90 tablet 3   bisoprolol  (ZEBETA ) 5 MG tablet Take 0.5 tablets (2.5 mg total) by mouth daily. 15 tablet 0   calcium -vitamin D  (OSCAL WITH D) 500-200 MG-UNIT tablet Take 1 tablet by mouth 2 (two) times daily. 90 tablet 6   dapagliflozin  propanediol (FARXIGA ) 10 MG TABS tablet Take 1 tablet (10 mg total) by mouth daily. 90 tablet 1   insulin  degludec (TRESIBA ) 100 UNIT/ML FlexTouch Pen Inject 5 Units into the skin daily.  ketoconazole  (NIZORAL ) 2 % shampoo Apply 1 Application topically once a week.     levocetirizine (XYZAL ) 5 MG tablet Take 1 tablet (5 mg total) by mouth at bedtime. (Patient not taking: Reported on 04/13/2024) 90 tablet 3   OXYGEN  Inhale 2 L into the lungs at bedtime.     OZEMPIC , 1 MG/DOSE, 4 MG/3ML SOPN Inject 1 mg into the skin once a week. Takes on Saturdays     potassium  chloride SA (KLOR-CON  M) 20 MEQ tablet Take 20 mEq by mouth 2 (two) times daily.     sodium zirconium cyclosilicate  (LOKELMA ) 10 g PACK packet Take 10 g by mouth 3 (three) times daily. 3 packet 0   spironolactone  (ALDACTONE ) 25 MG tablet Take 0.5 tablets (12.5 mg total) by mouth daily. 30 tablet 0   torsemide  (DEMADEX ) 20 MG tablet Take 4 tablets (80 mg total) by mouth daily as needed (Take if you gain 3 pounds in one day or 5 pounds in a week).     triamcinolone  ointment (KENALOG ) 0.1 % Apply 1 Application topically 2 (two) times daily. Mix with Eucerin cream     No current facility-administered medications for this visit.   There were no vitals taken for this visit.  Wt Readings from Last 3 Encounters:  04/22/24 80.9 kg (178 lb 6.4 oz)  04/17/24 77.4 kg (170 lb 10.2 oz)  03/25/24 75.1 kg (165 lb 9.6 oz)   PHYSICAL EXAM: General:  Well appearing, elderly. No respiratory difficulty HEENT: normal Neck: supple. no JVD. Carotids 2+ bilat; no bruits. No lymphadenopathy or thyromegaly appreciated. Cor: PMI nondisplaced. Regular rate & rhythm. No rubs, gallops or murmurs. Lungs: clear Abdomen: soft, nontender, nondistended. No hepatosplenomegaly. No bruits or masses. Good bowel sounds. Extremities: no cyanosis, clubbing, rash, edema Neuro: alert & oriented x 3, cranial nerves grossly intact. moves all 4 extremities w/o difficulty. Affect pleasant.  Device interrogation: stable thoracic impedence c/w euvolemia, no VT/VF, personally reviewed   ASSESSMENT & PLAN:  1. Symptomatic Hypotension - had syncope 2/2 low BP after initiation of lisinopril , SBPs 70s, Head CT/MRI and carotid dopplers unremarkable. ICD interrogation reportedly showed no VT - Bidil  and lisinopril  discontinued - resolved. No further syncope/near syncope. BP stable 110/62 today   2. Chronic HFrEF  - NICM LHC 2017 normal cors. - Echo (2022):EF 25-30% which has been down for many years.  - Echo (10/23): EF 25-30%, D shaped  LV, RV mildly dilated w/ mod elevated RVSP, RV systolic fx mildly reduced, severe BAE, severe TR, mild-mod MR, IVC dilated, assumed RAP 15  - RHC (11/23): well-compensated filling pressures, normal CO and mild PH. - Echo 3/25 EF 35-40%, RV nl  - s/p STJ ICD - Stable NYHA Class II-III. Euvolemic on exam and device interrogation - Continue Farxiga  10 mg daily  - Continue Spiro 12.5 mg daily  - Continue bisoprolol  2.5 mg daily  - off Bidil  due to hypotension. - failed ACE-I (lisinopril ) due to hypotension  - continue torsemide  80 mg bid  - BMP done in PCP office last week (post hospital), SCr and K both stable/WNL   2. COPD/Tobacco Abuse - Still smoking 3-4 cigs/day  - Discussed need for cessation - Says she wears her O2 at home - Does not want to be referred back to pulmonologist, states she would not go  3. PAF - Continue low dose amio 100 mg daily. EKG today shows NSR, HR 77 bpm    - Recent amio labs (TSH and HFTs) were normal  3/25  - Continue Eliquis  5 mg bid  - She says she will not wear CPAP, so will not pursue sleep study.    4. CKD IV - Renal u/s (10/23) no hydronephrosis - Baseline SCr 2-3 but more recently 1.9-2.1, stable on last wks labs (personally reviewed)  - Continue Farxiga      5.  DMII - A1c 9.2 (10/24) - On SGLT2i + statin. - Management per PCP.  6. Obesity - There is no height or weight on file to calculate BMI. - She is on Ozempic . - Managed by PCP   7. HTN - recent hypotension - BP now stable w/ med reduction  - continue HF GDMT regimen per above  - keep of lisinopril  and Bidil    F/u w/ Dr. Bensimhon in 4-6 months   Harlene CHRISTELLA Gainer, FNP  2:45 PM

## 2024-05-01 ENCOUNTER — Encounter (HOSPITAL_COMMUNITY)

## 2024-05-03 ENCOUNTER — Telehealth (HOSPITAL_COMMUNITY): Payer: Self-pay

## 2024-05-03 ENCOUNTER — Telehealth (HOSPITAL_COMMUNITY): Payer: Self-pay | Admitting: Cardiology

## 2024-05-03 ENCOUNTER — Other Ambulatory Visit: Payer: Self-pay

## 2024-05-03 NOTE — Patient Outreach (Signed)
 Complex Care Management   Visit Note  05/03/2024  Name:  [name] MRN: [id number] DOB: [birth year]  Situation: Referral received for Complex Care Management related to Heart Failure and COPD I obtained verbal consent from Patient.  Visit completed with Arland Gentry  on the phone  Background:   Past Medical History:  Diagnosis Date   Acute metabolic encephalopathy 07/26/2022   ATN (acute tubular necrosis) (HCC) 07/15/2014   Automatic implantable cardioverter-defibrillator in situ    Automatic implantable cardioverter-defibrillator in situ 05/04/2007   Qualifier: Diagnosis of  By: [place]ney [place]     Blood transfusion without reported diagnosis    Cardiac defibrillator in situ    Atlas II VR (SJM) implanted by Dr Waddell   CHF (congestive heart failure) (HCC)    Chronic combined systolic and diastolic heart failure (HCC)    a. EF 35-40% in past;  b. Echo 7/13:  EF 45-50%, Gr 2 diast dysfn, mild AI, mild MAC, trivial MR, mild LAE, PASP 47.   Chronic ulcer of leg (HCC)    04-09-15 resolved-not a problem.   Colon polyps 04/12/2013   Rectosigmoid polyp   COPD (chronic obstructive pulmonary disease) (HCC)    O2 at night   Depression    Diabetes mellitus    Diabetes mellitus, type 2 (HCC) 04/03/2012   HIGH RISK FEET.. Please have patient take shoes and socks off every visit for visual foot inspection.     Eczema    Elevated alkaline phosphatase level    GGT and 5'nucleotidase 8/13 normal   Health care maintenance 01/22/2013   Surgically absent cervix- no pap needed (Path report 07/2000: uterine body with attached bilateral  adnexa and separate cervix.)   History of oxygen  administration    oxygen  @ 2 l/m nasally bedtime 24/7   HTN (hypertension)    Hx of cardiac cath    a. LHC 2003 normal;  b. LHC 6/13:  Mild calcification in the LM, o/w normal coronary arteries, EF 45%.    Hyperkalemia 08/08/2017   Hyperlipidemia    Elevated triglyceride in 2019   Hyperlipidemia associated with  type 2 diabetes mellitus (HCC) 05/25/2007   Qualifier: Diagnosis of  By: Gladis FNP, Nykedtra     Hyperthyroidism, subclinical    HYPERTHYROIDISM, SUBCLINICAL 05/06/2009   Qualifier: Diagnosis of  By: Rolan Hough     Implantable cardioverter-defibrillator (ICD) generator end of life    Lipoma    Need for hepatitis C screening test 04/25/2019   NICM (nonischemic cardiomyopathy) (HCC)    Obesity    On home oxygen  therapy    2L; 24/7 (10/11/2016)   Post menopausal syndrome    Shortness of breath 07/14/2017   Skin rash 07/12/2018   Sleep apnea    pt denies 04/12/2013    Assessment: Patient Reported Symptoms:  Cognitive Cognitive Status: Able to follow simple commands, Alert and oriented to person, place, and time, Normal speech and language skills Cognitive/Intellectual Conditions Management [RPT]: None reported or documented in medical history or problem list      Neurological Neurological Review of Symptoms: No symptoms reported    HEENT HEENT Symptoms Reported: No symptoms reported HEENT Management Strategies: Routine screening    Cardiovascular Cardiovascular Symptoms Reported: Swelling in legs or feet, Fatigue (Patient reports increased swelling in bilateral feet and hands) Does patient have uncontrolled Hypertension?: No (Confirmed patient does have BP cuff at home) Cardiovascular Management Strategies: Weight management, Medication therapy Do You Have a Working Readable Scale?: Yes Weight: 167 lb  1.6 oz (75.8 kg) (Patient reported at time of visit) Cardiovascular Comment: Patient reports she does have scale at home but has not been weighing daily. Patient tried to go to her follow-up at the heart failure clinic but she went to the wrong location and missed appointment. Patient reports she has not been checking BP at home either. She has mostly been spending her time in the bed. Patient reports she has been taking PRN Torsemide  every day this week (started Monday 04/29/24).  Call placed to HF clinic with patient present to report symptoms. Message to be forwarded to the triage provider and missed appointment rescheduled 05/06/24 at 3:00 PM.  Respiratory Respiratory Symptoms Reported: Shortness of breath, Productive cough Additional Respiratory Details: Shortness of breath with exertion. A little cough, with a small amount of white mucus production. Patient reports she has been wearing 2L O2 at nighttime, has not needed increased oxygen . She continues to use Anoro Ellipta  daily and last PRN Albuterol  use was Sunday 04/28/24. Respiratory Management Strategies: Oxygen  therapy  Endocrine Endocrine Symptoms Reported: No symptoms reported Is patient diabetic?: Yes Is patient checking blood sugars at home?: Yes List most recent blood sugar readings, include date and time of day: Dexcom G7, 143 fasting this morning    Gastrointestinal Gastrointestinal Symptoms Reported: Change in appetite, Constipation Additional Gastrointestinal Details: Patient reports decreased appetite over the past couple of days. Last BM last week. She had to take Castor Oil to have BM and plans to take more later today. Denies nausea or abd pain. Gastrointestinal Management Strategies: Coping strategies    Genitourinary Genitourinary Symptoms Reported: No symptoms reported    Integumentary Integumentary Symptoms Reported: Other Other Integumentary Symptoms: Eczema all over is clearing up per patient Skin Management Strategies: Medical device  Musculoskeletal Musculoskelatal Symptoms Reviewed: Unsteady gait, Weakness Musculoskeletal Management Strategies: Medical device Musculoskeletal Comment: Patient reports HH PT [facility]) was supposed to start after hospitalization. They came one time to do the initial evaluation but she has not had f/u yet. She reports they have called to try and schedule f/u but she has had conflicts on her schedule every time they have wanted to come. Falls in the past year?:  No Number of falls in past year: 1 or less Was there an injury with Fall?: No Fall Risk Category Calculator: 0 Patient Fall Risk Level: Low Fall Risk Patient at Risk for Falls Due to: Impaired balance/gait Fall risk Follow up: Falls evaluation completed, Education provided  Psychosocial Psychosocial Symptoms Reported: No symptoms reported     Quality of Family Relationships: helpful, involved, supportive Do you feel physically threatened by others?: No      05/03/2024    9:31 AM  Depression screen PHQ 2/9  Decreased Interest 0  Down, Depressed, Hopeless 0  PHQ - 2 Score 0    There were no vitals filed for this visit.  Medications Reviewed Today     Reviewed by Arno Rosaline SQUIBB, RN (Registered Nurse) on 05/03/24 at [phone]  Med List Status: <None>   Medication Order Taking? Sig Documenting Provider Last Dose Status Informant  albuterol  (VENTOLIN  HFA) 108 (90 Base) MCG/ACT inhaler [id number] Yes Inhale 2 puffs into the lungs every 6 (six) hours as needed for wheezing or shortness of breath. [name], MD  Active Self, Pharmacy Records  ANORO ELLIPTA  62.5-25 MCG/ACT AEPB [id number] Yes INHALE 1 PUFF INTO THE LUNGS DAILY Amoako, Prince, MD  Active Self, Pharmacy Records  apixaban  (ELIQUIS ) 5 MG TABS tablet [id number] Yes Take 1 tablet (  5 mg total) by mouth 2 (two) times daily. Bensimhon, Toribio SAUNDERS, MD  Active Self, Pharmacy Records           Med Note (Nuala Chiles P   Fri May 03, 2024  9:15 AM)    atorvastatin  (LIPITOR ) 80 MG tablet [id number] Yes Take 1 tablet (80 mg total) by mouth at bedtime. Elicia Sharper, DO  Active Self, Pharmacy Records  bisoprolol  (ZEBETA ) 5 MG tablet [id number] Yes Take 0.5 tablets (2.5 mg total) by mouth daily. D'Mello, Rosalyn, DO  Active   calcium -vitamin D  (OSCAL WITH D) 500-200 MG-UNIT tablet [id number] Yes Take 1 tablet by mouth 2 (two) times daily. Lovey Satterfield, MD  Active Self, Pharmacy Records  dapagliflozin  propanediol (FARXIGA ) 10 MG  TABS tablet [id number] Yes Take 1 tablet (10 mg total) by mouth daily. Amoako, Prince, MD  Active Self, Pharmacy Records  insulin  degludec (TRESIBA ) 100 UNIT/ML FlexTouch Pen [id number] Yes Inject 5 Units into the skin daily. D'Mello, Rosalyn, DO  Active   ketoconazole  (NIZORAL ) 2 % shampoo [id number] Yes Apply 1 Application topically once a week. [provider]  Active Self, Pharmacy Records           Med Note LEOBARDO, NICOLE   Sat Apr 13, 2024 10:40 PM) Patient uses on Tuesdays  levocetirizine (XYZAL ) 5 MG tablet [id number] Yes Take 1 tablet (5 mg total) by mouth at bedtime. Elicia Sharper, DO  Active Self, Pharmacy Records           Med Note LEOBARDO, NICOLE   Sat Apr 13, 2024 10:42 PM) Patient is unsure why she has this medication.  She has not started pending appt with PCP.  OXYGEN  [id number] Yes Inhale 2 L into the lungs at bedtime. [provider]  Active Self, Pharmacy Records  OZEMPIC , 1 MG/DOSE, 4 MG/3ML SOPN [id number] Yes Inject 1 mg into the skin once a week. Takes on Saturdays [provider]  Active Self, Pharmacy Records  potassium chloride  SA (KLOR-CON  M) 20 MEQ tablet [id number]  Take 20 mEq by mouth 2 (two) times daily.  Patient not taking: Reported on 05/03/2024   [provider]  Active Self, Pharmacy Records  sodium zirconium cyclosilicate  (LOKELMA ) 10 g PACK packet [id number] Yes Take 10 g by mouth 3 (three) times daily. Amoako, Prince, MD  Active   spironolactone  (ALDACTONE ) 25 MG tablet [id number]  Take 0.5 tablets (12.5 mg total) by mouth daily.  Patient not taking: Reported on 05/03/2024   D'Mello, Rosalyn, DO  Active   torsemide  (DEMADEX ) 20 MG tablet [id number] Yes Take 4 tablets (80 mg total) by mouth daily as needed (Take if you gain 3 pounds in one day or 5 pounds in a week). D'Mello, Rosalyn, DO  Active   triamcinolone  ointment (KENALOG ) 0.1 % [id number]  Apply 1 Application topically 2 (two) times daily. Mix with Eucerin cream [provider]  Active Self, Pharmacy Records            Recommendation:   Specialty provider follow-up Heart Failure clinic 05/06/24 at 3 PM Continue Current Plan of Care Patient advised to begin checking weight and BP daily  Follow Up Plan:   Telephone follow up appointment date/time:  05/08/24 at 11 AM  Rosaline Finlay, RN MSN [place]  [facility] RN Care Manager Direct Dial: [phone]  Fax: [phone]

## 2024-05-03 NOTE — Telephone Encounter (Signed)
 Called to confirm/remind patient of their appointment at the [facility] on 05/06/24 3:00.   Appointment:   [x] Confirmed  [] Left mess   [] No answer/No voice mail  [] VM Full/unable to leave message  [] Phone not in service  Patient reminded to bring all medications and/or complete list.  Confirmed patient has transportation. Gave directions, instructed to utilize valet parking.

## 2024-05-03 NOTE — Telephone Encounter (Signed)
 Pt aware and voiced understanding

## 2024-05-03 NOTE — Telephone Encounter (Signed)
 [facility] RN case management and patient called to report edema in bilateral hands/legs and increase in SOB  Reports weight is stable at 167 Reports she has been taking PRN torsemide  daily since Monday   Missed HFU 7/30, rescheduled for 8/4  RN case manger wanted to report above with recent hospital discharge.  Please advise if changes are needed

## 2024-05-03 NOTE — Patient Instructions (Signed)
 Visit Information  Thank you for taking time to visit with me today. Please don't hesitate to contact me if I can be of assistance to you before our next scheduled appointment.  Our next appointment is by telephone on 05/08/24 at 11 AM Please call the care guide team at [phone] if you need to cancel or reschedule your appointment.   Following is a copy of your care plan:   Goals Addressed             This Visit's Progress    [facility] RN Care Plan   On track    Problems:  Chronic Disease Management support and education needs related to CHF and COPD  Goal: Over the next 30 days the Patient will attend all scheduled medical appointments: HF Clinic 05/06/24 as evidenced by completed visit notes uploaded to EMR        demonstrate a decrease CHF in exacerbations as evidenced by patient report of decreased symptoms of swelling, fatigue, shortness of breath, cough, and decreased appetite demonstrate Improved health management independence as evidenced by patient report of weighing daily and checking BP daily        take all medications exactly as prescribed and will call provider for medication related questions as evidenced by patient report of medication compliance     Interventions:   Heart Failure Interventions: Reviewed Heart Failure Action Plan in depth and provided written copy Assessed need for readable accurate scales in home Provided education about placing scale on hard, flat surface Advised patient to weigh each morning after emptying bladder Discussed importance of daily weight and advised patient to weigh and record daily Reviewed role of diuretics in prevention of fluid overload and management of heart failure; Discussed the importance of keeping all appointments with provider Screening for signs and symptoms of depression related to chronic disease state  Assessed social determinant of health barriers  Call placed to HF clinic with patient on the phone to report symptoms  and reschedule missed appointment  COPD Interventions: Provided instruction about proper use of medications used for management of COPD including inhalers Use of home oxygen   Patient Self-Care Activities:  Attend all scheduled provider appointments Call provider office for new concerns or questions  Perform all self care activities independently  Take medications as prescribed   Work with the pharmacist to address medication management needs and will continue to work with the clinical team to address health care and disease management related needs call office if I gain more than 2 pounds in one day or 5 pounds in one week track weight in diary watch for swelling in feet, ankles and legs every day weigh myself daily develop a rescue plan follow rescue plan if symptoms flare-up know when to call the doctor:for new or worsening symptoms  Plan:  Telephone follow up appointment with care management team member scheduled for:  05/08/24 at 11 AM             Please call the Suicide and Crisis Lifeline: 988 call 1-800-273-TALK (toll free, 24 hour hotline) if you are experiencing a Mental Health or Behavioral Health Crisis or need someone to talk to.  Patient verbalizes understanding of instructions and care plan provided today and agrees to view in MyChart. Active MyChart status and patient understanding of how to access instructions and care plan via MyChart confirmed with patient.     Rosaline Finlay, RN MSN [place]  [facility] Population Health RN Care Manager Direct Dial: [phone]  Fax: [phone]  Heart Failure Action Plan A heart failure action plan helps you know what to do when you have symptoms of heart failure. Your action plan is a color-coded plan that lists the symptoms to watch for and indicates what actions to take. If you have symptoms in the green zone, you're doing well. If you have symptoms in the yellow zone, you're having problems. If you have symptoms  in the red zone, you need medical care right away. Follow the plan that was created by you and your health care provider. Review your plan each time you visit your provider. Green zone These signs mean you're doing well and can continue what you're doing: You don't have new or worsening shortness of breath. You have very little swelling or no new swelling. Your weight is stable (no gain or loss). You have a normal activity level. You don't have chest pain or any other new symptoms. Yellow zone These signs and symptoms mean your condition may be getting worse and you should make some changes: You have trouble breathing when you're active. You have swelling in your feet or legs or have discomfort in your belly. You gain 2-3 lb (0.9-1.4 kg) in 24 hours, or 5 lb (2.3 kg) in a week. This amount may be more or less depending on your condition. You get tired easily. You have trouble sleeping. You have a dry cough. If you have any of these symptoms: Contact your provider within the next day. Your provider may adjust your medicines. Red zone These signs and symptoms mean you should get medical help right away: You have trouble breathing when resting or cannot lie flat and you need to raise your head to help you breathe. You have a dry cough that's getting worse. You have swelling or pain in your feet or legs or discomfort in your belly that's getting worse. You suddenly gain more than 2-3 lb (0.9-1.4 kg) in 24 hours, or more than 5 lb (2.3 kg) in a week. This amount may be more or less depending on your condition. You have trouble staying awake or you feel confused. You don't have an appetite. You have worsening sadness or depression. These symptoms may be an emergency. Call 911 right away. Do not wait to see if the symptoms will go away. Do not drive yourself to the hospital. Follow these instructions at home: Take medicines only as told. Eat a heart-healthy diet. Work with a dietitian to  create an eating plan that's best for you. Weigh yourself each day. Your target weight is __________ lb (__________ kg). Call your provider if you gain more than __________ lb (__________ kg) in 24 hours, or more than __________ lb (__________ kg) in a week. Health care provider name: _____________________________________________________ Health care provider phone number: _____________________________________________________ Where to find more information American Heart Association: heart.org This information is not intended to replace advice given to you by your health care provider. Make sure you discuss any questions you have with your health care provider. Document Revised: 05/04/2023 Document Reviewed: 05/04/2023 Elsevier Patient Education  2024 [employer].

## 2024-05-04 DIAGNOSIS — I429 Cardiomyopathy, unspecified: Secondary | ICD-10-CM | POA: Diagnosis not present

## 2024-05-04 DIAGNOSIS — R1314 Dysphagia, pharyngoesophageal phase: Secondary | ICD-10-CM | POA: Diagnosis not present

## 2024-05-04 DIAGNOSIS — I083 Combined rheumatic disorders of mitral, aortic and tricuspid valves: Secondary | ICD-10-CM | POA: Diagnosis not present

## 2024-05-04 DIAGNOSIS — M810 Age-related osteoporosis without current pathological fracture: Secondary | ICD-10-CM | POA: Diagnosis not present

## 2024-05-04 DIAGNOSIS — E785 Hyperlipidemia, unspecified: Secondary | ICD-10-CM | POA: Diagnosis not present

## 2024-05-04 DIAGNOSIS — J9621 Acute and chronic respiratory failure with hypoxia: Secondary | ICD-10-CM | POA: Diagnosis not present

## 2024-05-04 DIAGNOSIS — R918 Other nonspecific abnormal finding of lung field: Secondary | ICD-10-CM | POA: Diagnosis not present

## 2024-05-04 DIAGNOSIS — I480 Paroxysmal atrial fibrillation: Secondary | ICD-10-CM | POA: Diagnosis not present

## 2024-05-04 DIAGNOSIS — E1122 Type 2 diabetes mellitus with diabetic chronic kidney disease: Secondary | ICD-10-CM | POA: Diagnosis not present

## 2024-05-04 DIAGNOSIS — L308 Other specified dermatitis: Secondary | ICD-10-CM | POA: Diagnosis not present

## 2024-05-04 DIAGNOSIS — E1143 Type 2 diabetes mellitus with diabetic autonomic (poly)neuropathy: Secondary | ICD-10-CM | POA: Diagnosis not present

## 2024-05-04 DIAGNOSIS — F17210 Nicotine dependence, cigarettes, uncomplicated: Secondary | ICD-10-CM | POA: Diagnosis not present

## 2024-05-04 DIAGNOSIS — J449 Chronic obstructive pulmonary disease, unspecified: Secondary | ICD-10-CM | POA: Diagnosis not present

## 2024-05-04 DIAGNOSIS — I130 Hypertensive heart and chronic kidney disease with heart failure and stage 1 through stage 4 chronic kidney disease, or unspecified chronic kidney disease: Secondary | ICD-10-CM | POA: Diagnosis not present

## 2024-05-04 DIAGNOSIS — I5042 Chronic combined systolic (congestive) and diastolic (congestive) heart failure: Secondary | ICD-10-CM | POA: Diagnosis not present

## 2024-05-04 DIAGNOSIS — I4892 Unspecified atrial flutter: Secondary | ICD-10-CM | POA: Diagnosis not present

## 2024-05-04 DIAGNOSIS — N179 Acute kidney failure, unspecified: Secondary | ICD-10-CM | POA: Diagnosis not present

## 2024-05-04 DIAGNOSIS — L2089 Other atopic dermatitis: Secondary | ICD-10-CM | POA: Diagnosis not present

## 2024-05-04 DIAGNOSIS — M48061 Spinal stenosis, lumbar region without neurogenic claudication: Secondary | ICD-10-CM | POA: Diagnosis not present

## 2024-05-04 DIAGNOSIS — K3184 Gastroparesis: Secondary | ICD-10-CM | POA: Diagnosis not present

## 2024-05-04 DIAGNOSIS — N184 Chronic kidney disease, stage 4 (severe): Secondary | ICD-10-CM | POA: Diagnosis not present

## 2024-05-04 DIAGNOSIS — K760 Fatty (change of) liver, not elsewhere classified: Secondary | ICD-10-CM | POA: Diagnosis not present

## 2024-05-04 DIAGNOSIS — E042 Nontoxic multinodular goiter: Secondary | ICD-10-CM | POA: Diagnosis not present

## 2024-05-06 ENCOUNTER — Ambulatory Visit (INDEPENDENT_AMBULATORY_CARE_PROVIDER_SITE_OTHER): Admitting: Student

## 2024-05-06 ENCOUNTER — Encounter (HOSPITAL_COMMUNITY): Payer: Self-pay

## 2024-05-06 ENCOUNTER — Ambulatory Visit (HOSPITAL_BASED_OUTPATIENT_CLINIC_OR_DEPARTMENT_OTHER)
Admission: RE | Admit: 2024-05-06 | Discharge: 2024-05-06 | Disposition: A | Discharge status: Home / Self Care | Source: Ambulatory Visit | Attending: Family Medicine | Admitting: Family Medicine

## 2024-05-06 ENCOUNTER — Encounter: Admitting: Student

## 2024-05-06 VITALS — BP 105/61 | HR 85 | Ht 62.0 in | Wt 167.1 lb

## 2024-05-06 VITALS — BP 109/58 | HR 89 | Temp 97.9°F | Ht 62.0 in

## 2024-05-06 DIAGNOSIS — E785 Hyperlipidemia, unspecified: Secondary | ICD-10-CM | POA: Diagnosis not present

## 2024-05-06 DIAGNOSIS — J9610 Chronic respiratory failure, unspecified whether with hypoxia or hypercapnia: Secondary | ICD-10-CM | POA: Insufficient documentation

## 2024-05-06 DIAGNOSIS — Z9981 Dependence on supplemental oxygen: Secondary | ICD-10-CM | POA: Diagnosis not present

## 2024-05-06 DIAGNOSIS — E669 Obesity, unspecified: Secondary | ICD-10-CM | POA: Insufficient documentation

## 2024-05-06 DIAGNOSIS — E1122 Type 2 diabetes mellitus with diabetic chronic kidney disease: Secondary | ICD-10-CM | POA: Diagnosis not present

## 2024-05-06 DIAGNOSIS — I480 Paroxysmal atrial fibrillation: Secondary | ICD-10-CM | POA: Diagnosis not present

## 2024-05-06 DIAGNOSIS — M25472 Effusion, left ankle: Secondary | ICD-10-CM

## 2024-05-06 DIAGNOSIS — E1165 Type 2 diabetes mellitus with hyperglycemia: Secondary | ICD-10-CM | POA: Diagnosis not present

## 2024-05-06 DIAGNOSIS — D72829 Elevated white blood cell count, unspecified: Secondary | ICD-10-CM | POA: Diagnosis not present

## 2024-05-06 DIAGNOSIS — I130 Hypertensive heart and chronic kidney disease with heart failure and stage 1 through stage 4 chronic kidney disease, or unspecified chronic kidney disease: Secondary | ICD-10-CM | POA: Diagnosis not present

## 2024-05-06 DIAGNOSIS — I5042 Chronic combined systolic (congestive) and diastolic (congestive) heart failure: Secondary | ICD-10-CM

## 2024-05-06 DIAGNOSIS — M25579 Pain in unspecified ankle and joints of unspecified foot: Secondary | ICD-10-CM | POA: Insufficient documentation

## 2024-05-06 DIAGNOSIS — I5022 Chronic systolic (congestive) heart failure: Secondary | ICD-10-CM | POA: Diagnosis not present

## 2024-05-06 DIAGNOSIS — N184 Chronic kidney disease, stage 4 (severe): Secondary | ICD-10-CM | POA: Insufficient documentation

## 2024-05-06 DIAGNOSIS — E119 Type 2 diabetes mellitus without complications: Secondary | ICD-10-CM

## 2024-05-06 DIAGNOSIS — Z7985 Long-term (current) use of injectable non-insulin antidiabetic drugs: Secondary | ICD-10-CM | POA: Diagnosis not present

## 2024-05-06 DIAGNOSIS — Z95810 Presence of automatic (implantable) cardiac defibrillator: Secondary | ICD-10-CM | POA: Insufficient documentation

## 2024-05-06 DIAGNOSIS — Z1611 Resistance to penicillins: Secondary | ICD-10-CM | POA: Diagnosis not present

## 2024-05-06 DIAGNOSIS — I5082 Biventricular heart failure: Secondary | ICD-10-CM | POA: Diagnosis not present

## 2024-05-06 DIAGNOSIS — M25571 Pain in right ankle and joints of right foot: Secondary | ICD-10-CM

## 2024-05-06 DIAGNOSIS — E876 Hypokalemia: Secondary | ICD-10-CM | POA: Diagnosis not present

## 2024-05-06 DIAGNOSIS — R609 Edema, unspecified: Secondary | ICD-10-CM | POA: Diagnosis not present

## 2024-05-06 DIAGNOSIS — N3001 Acute cystitis with hematuria: Secondary | ICD-10-CM | POA: Diagnosis not present

## 2024-05-06 DIAGNOSIS — I10 Essential (primary) hypertension: Secondary | ICD-10-CM | POA: Diagnosis not present

## 2024-05-06 DIAGNOSIS — J449 Chronic obstructive pulmonary disease, unspecified: Secondary | ICD-10-CM | POA: Diagnosis not present

## 2024-05-06 DIAGNOSIS — M25471 Effusion, right ankle: Secondary | ICD-10-CM

## 2024-05-06 DIAGNOSIS — F17210 Nicotine dependence, cigarettes, uncomplicated: Secondary | ICD-10-CM | POA: Insufficient documentation

## 2024-05-06 DIAGNOSIS — E875 Hyperkalemia: Secondary | ICD-10-CM | POA: Diagnosis not present

## 2024-05-06 DIAGNOSIS — I4892 Unspecified atrial flutter: Secondary | ICD-10-CM | POA: Diagnosis not present

## 2024-05-06 DIAGNOSIS — Z720 Tobacco use: Secondary | ICD-10-CM

## 2024-05-06 DIAGNOSIS — D75839 Thrombocytosis, unspecified: Secondary | ICD-10-CM | POA: Diagnosis not present

## 2024-05-06 DIAGNOSIS — I428 Other cardiomyopathies: Secondary | ICD-10-CM | POA: Diagnosis not present

## 2024-05-06 DIAGNOSIS — I5043 Acute on chronic combined systolic (congestive) and diastolic (congestive) heart failure: Secondary | ICD-10-CM | POA: Diagnosis not present

## 2024-05-06 DIAGNOSIS — I071 Rheumatic tricuspid insufficiency: Secondary | ICD-10-CM | POA: Diagnosis not present

## 2024-05-06 DIAGNOSIS — Z6830 Body mass index (BMI) 30.0-30.9, adult: Secondary | ICD-10-CM | POA: Insufficient documentation

## 2024-05-06 DIAGNOSIS — K760 Fatty (change of) liver, not elsewhere classified: Secondary | ICD-10-CM | POA: Diagnosis not present

## 2024-05-06 DIAGNOSIS — E1143 Type 2 diabetes mellitus with diabetic autonomic (poly)neuropathy: Secondary | ICD-10-CM | POA: Diagnosis not present

## 2024-05-06 DIAGNOSIS — M25572 Pain in left ankle and joints of left foot: Secondary | ICD-10-CM

## 2024-05-06 DIAGNOSIS — R0602 Shortness of breath: Secondary | ICD-10-CM | POA: Diagnosis not present

## 2024-05-06 DIAGNOSIS — Z7901 Long term (current) use of anticoagulants: Secondary | ICD-10-CM | POA: Diagnosis not present

## 2024-05-06 DIAGNOSIS — R0600 Dyspnea, unspecified: Secondary | ICD-10-CM | POA: Diagnosis not present

## 2024-05-06 DIAGNOSIS — N179 Acute kidney failure, unspecified: Secondary | ICD-10-CM | POA: Diagnosis not present

## 2024-05-06 DIAGNOSIS — Z7984 Long term (current) use of oral hypoglycemic drugs: Secondary | ICD-10-CM | POA: Diagnosis not present

## 2024-05-06 DIAGNOSIS — R918 Other nonspecific abnormal finding of lung field: Secondary | ICD-10-CM | POA: Diagnosis not present

## 2024-05-06 DIAGNOSIS — Z794 Long term (current) use of insulin: Secondary | ICD-10-CM | POA: Diagnosis not present

## 2024-05-06 DIAGNOSIS — B961 Klebsiella pneumoniae [K. pneumoniae] as the cause of diseases classified elsewhere: Secondary | ICD-10-CM | POA: Diagnosis not present

## 2024-05-06 DIAGNOSIS — R531 Weakness: Secondary | ICD-10-CM | POA: Diagnosis not present

## 2024-05-06 MED ORDER — TORSEMIDE 20 MG PO TABS: 20.0000 mg | ORAL_TABLET | ORAL | 3 refills | Status: DC

## 2024-05-06 NOTE — Addendum Note (Signed)
 Addended by: ANTONE DWAYNE SAILOR on: 05/06/2024 04:53 PM   Modules accepted: Orders

## 2024-05-06 NOTE — Progress Notes (Addendum)
 CC: 2 weeks follow-up  HPI:  [name] is a 73 y.o. female living with a history stated below and presents today for follow up on hyperkalemia.   Patient was last seen in resident [facility] on 7/21, BMP notable for hyperkalemia K5.9, SCr 2.39 from 1.81.  She was asked to hold potassium supplement, hold spironolactone , and torsemide  20 mg was discontinued.  She is reporting ankle swelling right> left, and is painful. Endorses shortness of breadth, she is 2L supplemental oxygen . No chest pain, or orthopnea.   She was started on Lokelma  10 g for three doses.    Please see problem based assessment and plan for additional details.  Past Medical History:  Diagnosis Date   Acute metabolic encephalopathy 07/26/2022   ATN (acute tubular necrosis) (HCC) 07/15/2014   Automatic implantable cardioverter-defibrillator in situ    Automatic implantable cardioverter-defibrillator in situ 05/04/2007   Qualifier: Diagnosis of  By: [place]     Blood transfusion without reported diagnosis    Cardiac defibrillator in situ    Atlas II VR (SJM) implanted by Dr Waddell   CHF (congestive heart failure) (HCC)    Chronic combined systolic and diastolic heart failure (HCC)    a. EF 35-40% in past;  b. Echo 7/13:  EF 45-50%, Gr 2 diast dysfn, mild AI, mild MAC, trivial MR, mild LAE, PASP 47.   Chronic ulcer of leg (HCC)    04-09-15 resolved-not a problem.   Colon polyps 04/12/2013   Rectosigmoid polyp   COPD (chronic obstructive pulmonary disease) (HCC)    O2 at night   Depression    Diabetes mellitus    Diabetes mellitus, type 2 (HCC) 04/03/2012   HIGH RISK FEET.. Please have patient take shoes and socks off every visit for visual foot inspection.     Eczema    Elevated alkaline phosphatase level    GGT and 5'nucleotidase 8/13 normal   Health care maintenance 01/22/2013   Surgically absent cervix- no pap needed (Path report 07/2000: uterine body with attached bilateral  adnexa and separate  cervix.)   History of oxygen  administration    oxygen  @ 2 l/m nasally bedtime 24/7   HTN (hypertension)    Hx of cardiac cath    a. LHC 2003 normal;  b. LHC 6/13:  Mild calcification in the LM, o/w normal coronary arteries, EF 45%.    Hyperkalemia 08/08/2017   Hyperlipidemia    Elevated triglyceride in 2019   Hyperlipidemia associated with type 2 diabetes mellitus (HCC) 05/25/2007   Qualifier: Diagnosis of  By: Gladis FNP, [place]     Hyperthyroidism, subclinical    HYPERTHYROIDISM, SUBCLINICAL 05/06/2009   Qualifier: Diagnosis of  By: Rolan Hough     Implantable cardioverter-defibrillator (ICD) generator end of life    Lipoma    Need for hepatitis C screening test 04/25/2019   NICM (nonischemic cardiomyopathy) (HCC)    Obesity    On home oxygen  therapy    2L; 24/7 (10/11/2016)   Post menopausal syndrome    Shortness of breath 07/14/2017   Skin rash 07/12/2018   Sleep apnea    pt denies 04/12/2013    Current Outpatient Medications on File Prior to Visit  Medication Sig Dispense Refill   albuterol  (VENTOLIN  HFA) 108 (90 Base) MCG/ACT inhaler Inhale 2 puffs into the lungs every 6 (six) hours as needed for wheezing or shortness of breath. 8 g 2   ANORO ELLIPTA  62.5-25 MCG/ACT AEPB INHALE 1 PUFF INTO THE LUNGS DAILY  60 each 2   apixaban  (ELIQUIS ) 5 MG TABS tablet Take 1 tablet (5 mg total) by mouth 2 (two) times daily. 60 tablet 2   atorvastatin  (LIPITOR ) 80 MG tablet Take 1 tablet (80 mg total) by mouth at bedtime. 90 tablet 3   bisoprolol  (ZEBETA ) 5 MG tablet Take 0.5 tablets (2.5 mg total) by mouth daily. 15 tablet 0   calcium -vitamin D  (OSCAL WITH D) 500-200 MG-UNIT tablet Take 1 tablet by mouth 2 (two) times daily. 90 tablet 6   dapagliflozin  propanediol (FARXIGA ) 10 MG TABS tablet Take 1 tablet (10 mg total) by mouth daily. 90 tablet 1   insulin  degludec (TRESIBA ) 100 UNIT/ML FlexTouch Pen Inject 5 Units into the skin daily.     ketoconazole  (NIZORAL ) 2 % shampoo Apply 1  Application topically once a week.     levocetirizine (XYZAL ) 5 MG tablet Take 1 tablet (5 mg total) by mouth at bedtime. 90 tablet 3   OXYGEN  Inhale 2 L into the lungs at bedtime.     OZEMPIC , 1 MG/DOSE, 4 MG/3ML SOPN Inject 1 mg into the skin once a week. Takes on Saturdays     potassium chloride  SA (KLOR-CON  M) 20 MEQ tablet Take 20 mEq by mouth 2 (two) times daily. (Patient not taking: Reported on 05/03/2024)     sodium zirconium cyclosilicate  (LOKELMA ) 10 g PACK packet Take 10 g by mouth 3 (three) times daily. 3 packet 0   spironolactone  (ALDACTONE ) 25 MG tablet Take 0.5 tablets (12.5 mg total) by mouth daily. (Patient not taking: Reported on 05/03/2024) 30 tablet 0   torsemide  (DEMADEX ) 20 MG tablet Take 4 tablets (80 mg total) by mouth daily as needed (Take if you gain 3 pounds in one day or 5 pounds in a week).     triamcinolone  ointment (KENALOG ) 0.1 % Apply 1 Application topically 2 (two) times daily. Mix with Eucerin cream     No current facility-administered medications on file prior to visit.    Review of Systems: ROS negative except for what is noted on the assessment and plan.  Vitals:   05/06/24 1314  BP: (!) 109/58  Pulse: 89  Temp: 97.9 F (36.6 C)  TempSrc: Oral  Height: 5' 2 (1.575 m)      Physical Exam: Constitutional: NAD Cardiovascular: RRR, no murmurs. Pulmonary/Chest: Clear bilateral lungs Abdominal: soft, non-tender, non-distended. MSK: Bilateral ankle swelling with warmth, right ankle>left  tender with reduced range of motion.  No erythema.   Assessment & Plan:   Patient discussed with Dr. Karna Assessment & Plan Pain and swelling of ankle, unspecified laterality Chronic bilateral ankle swelling with progressive worsening over the past two weeks, likely secondary to holding torsemide  and spironolactone  due to prior hyperkalemia. Will restart torsemide  20 mg daily. On exam, there is bilateral ankle swelling with decreased range of motion, right greater  than left. The patient is afebrile. If symptoms do not improve with diuresis, joint aspiration will be considered to rule out septic arthritis. - CBC as above - ESR and CRP stat ordered. -Torsemide  20 mg as above. Hyperkalemia Hyperkalemia noted 2 weeks ago was likely secondary to hemolyzed blood sample. Potassium was 5.9, and serum creatinine had increased to 2.39 from a baseline of 1.81. Patient reports compliance with holding both spironolactone  and potassium supplementation. - BMP reviewed as above - Continue to hold spironolactone  and potassium supplementation Multiple lung nodules CXR on 7/12 showed multiple lung nodules . Repeat chest CT without contrast scheduled for 8/5. Chronic combined  systolic and diastolic heart failure (HCC) Slightly volume overloaded as evidenced by bilateral swelling in the legs.  Restarted torsemide  dose. -Scheduled for cardiology follow-up. - Torsemide  20 mg daily.    Orders Placed This Encounter  Procedures   BMP8   Sed Rate (ESR)   CRP (C-Reactive Protein)   CBC    [name], MD [facility] Internal Medicine, PGY-2  Date 05/06/2024 Time 2:39 PM

## 2024-05-06 NOTE — Addendum Note (Signed)
 Addended by: ANTONE DWAYNE SAILOR on: 05/06/2024 03:48 PM   Modules accepted: Orders

## 2024-05-06 NOTE — Assessment & Plan Note (Signed)
 CXR on 7/12 showed multiple lung nodules . Repeat chest CT without contrast scheduled for 8/5.

## 2024-05-06 NOTE — Progress Notes (Signed)
 ADVANCED HF CLINIC NOTE   PCP: [name], MD HF Cardiologist:  Dr. Cherrie   HPI: [name] is a 73 y.o. with a history of DM2, COPD, HTN, chronic respiratory failure, tobacco abuse, chronic systolic s/p St Jude ICD, EF has 25-30% for at least 5 years. Last LHC 2017, normal coronaries.    Admitted 9/23 with sepsis 2/2 cellulitis of RLE. Underwent TEE to r/o endocarditis. TEE (9/23) showed LVEF 30%, moderate TR, no valvular vegetation or device vegetation. Admitted a couple weeks later with hypothermia and hypoglycemia. Insulin  stopped and SGLT2i continued. Entresto  and spiro held due to low BP.   Admitted 10/23 with A/C HFrEF, AECOPD, and new onset A flutter RVR. Given steroinds + inhalers. Started on amio drip + heparin  drip. Diuresed with IV lasix .  Hospital course complicated by AKI. Renal US  negative for hydronephrosis. Had RHC prior to discharge. Low filing pressures and CI 2.5. Discharge weight 197 pounds.   Seen in ED 03/09/23 with SOB. BNP normal, felt to be COPD exac, improved after duonebs.  Seen in clinic in 8/24. Volume overloaded. Furoscix  prescribed. Diuresed well.   Echo 3/25, EF 35-40%, no significant valvular dz, RV normal   Seen by PCP on 12/26/23 and was hypertensive. Lisinopril  2.5 mg daily was added to regimen. Admitted the next day with syncope. Hypotensive in ED, BP in 70s. Head CT/MRI and carotid dopplers unremarkable. Device interrogation showed no VT. Syncope felt 2/2 hypotension. Bidil  and lisinopril  were discontinued.  Admitted 7/25 with a/c HF. Diuresed with IV lasix . GDMT limited by AKI. Echo showed EF 35-40%, RV ok. She was discharged home, weight 170 lbs.  Today she returns for HF follow up with her son, with whom she lives. Overall feeling fair. She can do ADLs with assistance, she has SOB and fatigue with walking short distances. Legs have been swelling x 2 weeks. Saw PCP today and torsemide  20 mg daily restarted. Rare chest pain. Denies palpitations,  abnormal bleeding, dizziness, or PND/Orthopnea. Appetite poor. Weight at home 168 pounds. Taking all medications. Still smoking 3-4 cigs/day. Wears 2L oxygen  at night.  Cardiac Testing  - Echo 7/25: EF 35-40%, RV ok  - Echo 3/25: EF 35-40%, RV ok  - RHC (11/23):  RA = 9 RV = 55/10 PA = 44/11 (23) PCW = 9 Fick cardiac output/index = 6.1/3.2 Thermo CO/CI = 4.8/2.5 PVR = 2.3 (Fick) 2.9 (TD) Ao sat = 95% PA sat = 67%, 67% PAPi = 5.0  - Echo (10/23): EF 25-30%, severe LV dysfunction, RV low/normal, mild to moderate MR with moderate MAC, severe TR - TEE (9/23): EF 30%, moderate TR, no valvular vegetation or device vegetation.  - Echo (10/23): EF 25-30% RV low normal. RA/LA severely dilated. D Sahped septum - Echo (9/22): EF 20-25% RV normal , LA severely reduced  - Echo (2018): EF 25-30% Grade IDD - LHC 2017 Normal Coronaries.    ROS: All systems negative except as listed in HPI, PMH and Problem List.  SH:  Social History   Socioeconomic History   Marital status: Significant Other    Spouse name: Not on file   Number of children: 3   Years of education: 29   Highest education level: 11th grade  Occupational History   Occupation: disabled  Tobacco Use   Smoking status: Every Day    Current packs/day: 0.50    Average packs/day: 0.5 packs/day for 45.0 years (22.5 ttl pk-yrs)    Types: Cigarettes   Smokeless tobacco: Never  Tobacco comments:    currently smoking  10 cigs per day  Vaping Use   Vaping status: Never Used  Substance and Sexual Activity   Alcohol use: Not Currently    Alcohol/week: 3.0 standard drinks of alcohol    Types: 3 Shots of liquor per week   Drug use: No   Sexual activity: Yes    Partners: Male    Birth control/protection: Surgical  Other Topics Concern   Not on file  Social History Narrative   ** Merged History Encounter **       Married   Patient enjoys going to [employer], church and spending time with family   Social Drivers of Manufacturing engineer Strain: Low Risk  (01/09/2024)   Overall Financial Resource Strain (CARDIA)    Difficulty of Paying Living Expenses: Not hard at all  Food Insecurity: No Food Insecurity (05/03/2024)   Hunger Vital Sign    Worried About Running Out of Food in the Last Year: Never true    Ran Out of Food in the Last Year: Never true  Transportation Needs: No Transportation Needs (05/03/2024)   PRAPARE - Administrator, Civil Service (Medical): No    Lack of Transportation (Non-Medical): No  Physical Activity: Inactive (01/09/2024)   Exercise Vital Sign    Days of Exercise per Week: 0 days    Minutes of Exercise per Session: 0 min  Stress: No Stress Concern Present (01/09/2024)   [employer] of Occupational Health - Occupational Stress Questionnaire    Feeling of Stress : Not at all  Social Connections: Moderately Integrated (04/14/2024)   Social Connection and Isolation Panel    Frequency of Communication with Friends and Family: More than three times a week    Frequency of Social Gatherings with Friends and Family: More than three times a week    Attends Religious Services: More than 4 times per year    Active Member of [employer] or Organizations: Yes    Attends Banker Meetings: More than 4 times per year    Marital Status: Widowed  Intimate Partner Violence: Not At Risk (05/03/2024)   Humiliation, Afraid, Rape, and Kick questionnaire    Fear of Current or Ex-Partner: No    Emotionally Abused: No    Physically Abused: No    Sexually Abused: No   FH:  Family History  Problem Relation Age of Onset   Stroke Mother    Seizures Father    Heart disease Father    Diabetes Sister    Asthma Maternal Aunt        aunts   Asthma Maternal Uncle        uncles   Heart disease Paternal Aunt        aunts   Heart disease Paternal Uncle        uncles   Heart disease Maternal Aunt        aunts   Heart disease Maternal Uncle        uncles   Heart disease Maternal  Grandfather    Breast cancer Daughter    Breast cancer Cousin    Asthma Grandchild    Colon cancer Neg Hx    Colon polyps Neg Hx    Esophageal cancer Neg Hx    Kidney disease Neg Hx    Gallbladder disease Neg Hx    Past Medical History:  Diagnosis Date   Acute metabolic encephalopathy 07/26/2022   ATN (acute tubular necrosis) (  HCC) 07/15/2014   Automatic implantable cardioverter-defibrillator in situ    Automatic implantable cardioverter-defibrillator in situ 05/04/2007   Qualifier: Diagnosis of  By: Kearney [place]     Blood transfusion without reported diagnosis    Cardiac defibrillator in situ    Atlas II VR (SJM) implanted by Dr Waddell   CHF (congestive heart failure) (HCC)    Chronic combined systolic and diastolic heart failure (HCC)    a. EF 35-40% in past;  b. Echo 7/13:  EF 45-50%, Gr 2 diast dysfn, mild AI, mild MAC, trivial MR, mild LAE, PASP 47.   Chronic ulcer of leg (HCC)    04-09-15 resolved-not a problem.   Colon polyps 04/12/2013   Rectosigmoid polyp   COPD (chronic obstructive pulmonary disease) (HCC)    O2 at night   Depression    Diabetes mellitus    Diabetes mellitus, type 2 (HCC) 04/03/2012   HIGH RISK FEET.. Please have patient take shoes and socks off every visit for visual foot inspection.     Eczema    Elevated alkaline phosphatase level    GGT and 5'nucleotidase 8/13 normal   Health care maintenance 01/22/2013   Surgically absent cervix- no pap needed (Path report 07/2000: uterine body with attached bilateral  adnexa and separate cervix.)   History of oxygen  administration    oxygen  @ 2 l/m nasally bedtime 24/7   HTN (hypertension)    Hx of cardiac cath    a. LHC 2003 normal;  b. LHC 6/13:  Mild calcification in the LM, o/w normal coronary arteries, EF 45%.    Hyperkalemia 08/08/2017   Hyperlipidemia    Elevated triglyceride in 2019   Hyperlipidemia associated with type 2 diabetes mellitus (HCC) 05/25/2007   Qualifier: Diagnosis of  By: Gladis  FNP, Nykedtra     Hyperthyroidism, subclinical    HYPERTHYROIDISM, SUBCLINICAL 05/06/2009   Qualifier: Diagnosis of  By: Rolan Hough     Implantable cardioverter-defibrillator (ICD) generator end of life    Lipoma    Need for hepatitis C screening test 04/25/2019   NICM (nonischemic cardiomyopathy) (HCC)    Obesity    On home oxygen  therapy    2L; 24/7 (10/11/2016)   Post menopausal syndrome    Shortness of breath 07/14/2017   Skin rash 07/12/2018   Sleep apnea    pt denies 04/12/2013   Current Outpatient Medications  Medication Sig Dispense Refill   albuterol  (VENTOLIN  HFA) 108 (90 Base) MCG/ACT inhaler Inhale 2 puffs into the lungs every 6 (six) hours as needed for wheezing or shortness of breath. 8 g 2   ANORO ELLIPTA  62.5-25 MCG/ACT AEPB INHALE 1 PUFF INTO THE LUNGS DAILY 60 each 2   apixaban  (ELIQUIS ) 5 MG TABS tablet Take 1 tablet (5 mg total) by mouth 2 (two) times daily. 60 tablet 2   atorvastatin  (LIPITOR ) 80 MG tablet Take 1 tablet (80 mg total) by mouth at bedtime. 90 tablet 3   bisoprolol  (ZEBETA ) 5 MG tablet Take 0.5 tablets (2.5 mg total) by mouth daily. 15 tablet 0   calcium -vitamin D  (OSCAL WITH D) 500-200 MG-UNIT tablet Take 1 tablet by mouth 2 (two) times daily. 90 tablet 6   dapagliflozin  propanediol (FARXIGA ) 10 MG TABS tablet Take 1 tablet (10 mg total) by mouth daily. 90 tablet 1   insulin  degludec (TRESIBA ) 100 UNIT/ML FlexTouch Pen Inject 5 Units into the skin daily.     ketoconazole  (NIZORAL ) 2 % shampoo Apply 1 Application topically once a week.  levocetirizine (XYZAL ) 5 MG tablet Take 1 tablet (5 mg total) by mouth at bedtime. 90 tablet 3   OXYGEN  Inhale 2 L into the lungs at bedtime.     OZEMPIC , 1 MG/DOSE, 4 MG/3ML SOPN Inject 1 mg into the skin once a week. Takes on Saturdays     sodium zirconium cyclosilicate  (LOKELMA ) 10 g PACK packet Take 10 g by mouth 3 (three) times daily. 3 packet 0   spironolactone  (ALDACTONE ) 25 MG tablet Take 0.5 tablets  (12.5 mg total) by mouth daily. 30 tablet 0   triamcinolone  ointment (KENALOG ) 0.1 % Apply 1 Application topically 2 (two) times daily. Mix with Eucerin cream     potassium chloride  SA (KLOR-CON  M) 20 MEQ tablet Take 20 mEq by mouth 2 (two) times daily. (Patient not taking: Reported on 05/06/2024)     torsemide  (DEMADEX ) 20 MG tablet Take 4 tablets (80 mg total) by mouth daily as needed (Take if you gain 3 pounds in one day or 5 pounds in a week). (Patient not taking: Reported on 05/06/2024)     No current facility-administered medications for this encounter.   BP 105/61   Pulse 85   Ht 5' 2 (1.575 m)   Wt 75.8 kg (167 lb 1.6 oz)   SpO2 97%   BMI 30.56 kg/m   Wt Readings from Last 3 Encounters:  05/06/24 75.8 kg (167 lb 1.6 oz)  05/03/24 75.8 kg (167 lb 1.6 oz)  04/22/24 80.9 kg (178 lb 6.4 oz)   PHYSICAL EXAM: General:  NAD. No resp difficulty, arrived in [facility], chronically-ill appearing HEENT: Normal Neck: Supple. No JVD. Cor: Regular rate & rhythm. No rubs, gallops or murmurs. Lungs: Diminished all lobes Abdomen: Soft, nontender, nondistended.  Extremities: No cyanosis, clubbing, rash, edema Neuro: Alert & oriented x 3, moves all 4 extremities w/o difficulty. Affect pleasant.  Device interrogation (personally reviewed): thoracic impedence up suggesting low volume, VP <1%, no VT  ASSESSMENT & PLAN:  1. Chronic HFrEF  - NICM LHC 2017 normal cors. - Echo (2022):EF 25-30% which has been down for many years.  - Echo (10/23): EF 25-30%, D shaped LV, RV mildly dilated w/ mod elevated RVSP, RV systolic fx mildly reduced, severe BAE, severe TR, mild-mod MR, IVC dilated, assumed RAP 15  - RHC (11/23): well-compensated filling pressures, normal CO and mild PH. - Echo 3/25 EF 35-40%, RV nl  - s/p STJ ICD - Echo 7/25: EF 35-40%, RV ok - Stable NYHA III, functional status confounded by deconditioning and COPD. Volume OK today, CorVue suggests volume low. Chronic LEE likely has component of  venous stasis - Given Rx for compression hose. - Change torsemide  to 20 mg MWF - Continue Farxiga  10 mg daily  - Continue spiro 12.5 mg daily  - Continue bisoprolol  2.5 mg daily  - off Bidil  due to hypotension. - failed ACE-I (lisinopril ) due to hypotension  - Labs today at PCP, awaiting results. - Will ask device RN to send transmission in 1 week to follow volume  2. COPD/Tobacco Abuse - Still smoking 3-4 cigs/day  - Discussed need for cessation - Says she wears her O2 at home - Does not want to be referred back to pulmonologist, states she would not go  3. PAF - Regular on exam.  - Continue Eliquis  5 mg bid  - Continue low dose amio 100 mg daily.   - Amio labs normal 3/25  - She says she will not wear CPAP, so will not pursue sleep  study.  4. CKD IV - Renal u/s (10/23) no hydronephrosis - Baseline SCr 2-3   - Continue Farxiga  - Labs today.    5.  DMII - A1c 7.5 (6/25) - On SGLT2i + statin. - Management per PCP.  6. Obesity - Body mass index is 30.56 kg/m. - She is on Ozempic .  7. HTN - BP stable, now off ACEi and Bidil  - Continue GDMT as above  Follow up in 4 months with Dr. Bensimhon  Jana Swartzlander M Donzel Romack, FNP  3:31 PM

## 2024-05-06 NOTE — Progress Notes (Signed)
 Internal Medicine Clinic Attending  I was physically present during the key portions of the resident provided service and participated in the medical decision making of patient's management care. I reviewed pertinent patient test results.  The assessment, diagnosis, and plan were formulated together and I agree with the documentation in the resident's note.  Presents with evidence of volume overload in the setting of diuretic being held since 7/23. Bilateral ankles and feet are swollen with reduced ROM and tenderness due to edema. Similar in size, no erythema or warmth. History of right ankle injury s/p ORIF in 2020. Low suspicion for infectious etiology. Increased dyspnea with exertion. Using oxygen  prn chronically. On RA during today's visit, O2 97%. We have discussed resuming torsemide . Follow up with cardiology after our visit, switched to MWF. Labs pending, not clear that hyperkalemia was secondary to hemolysis, also had worsening of kidney function at that time.  Karna Fellows, MD

## 2024-05-06 NOTE — Assessment & Plan Note (Addendum)
 Chronic bilateral ankle swelling with progressive worsening over the past two weeks, likely secondary to holding torsemide  and spironolactone  due to prior hyperkalemia. Will restart torsemide  20 mg daily. On exam, there is bilateral ankle swelling with decreased range of motion, right greater than left. The patient is afebrile. If symptoms do not improve with diuresis, joint aspiration will be considered to rule out septic arthritis. - CBC as above - ESR and CRP stat ordered. -Torsemide  20 mg as above.

## 2024-05-06 NOTE — Progress Notes (Deleted)
 CC: 2 weeks follow-up  HPI:  [name] is a 73 y.o. female living with a history stated below and presents today for ***.   Patient was last seen in [facility] on 7/21, BMP notable for hyperkalemia K5.9, SCr 2.39 from 1.81.  She was asked to hold potassium supplement, hold spironolactone , and torsemide  20 mg was discontinued.  She was started on Lokelma  10 g for three doses.    Please see problem based assessment and plan for additional details.  Past Medical History:  Diagnosis Date   Acute metabolic encephalopathy 07/26/2022   ATN (acute tubular necrosis) (HCC) 07/15/2014   Automatic implantable cardioverter-defibrillator in situ    Automatic implantable cardioverter-defibrillator in situ 05/04/2007   Qualifier: Diagnosis of  By: Kearney [place]     Blood transfusion without reported diagnosis    Cardiac defibrillator in situ    Atlas II VR (SJM) implanted by Dr Waddell   CHF (congestive heart failure) (HCC)    Chronic combined systolic and diastolic heart failure (HCC)    a. EF 35-40% in past;  b. Echo 7/13:  EF 45-50%, Gr 2 diast dysfn, mild AI, mild MAC, trivial MR, mild LAE, PASP 47.   Chronic ulcer of leg (HCC)    04-09-15 resolved-not a problem.   Colon polyps 04/12/2013   Rectosigmoid polyp   COPD (chronic obstructive pulmonary disease) (HCC)    O2 at night   Depression    Diabetes mellitus    Diabetes mellitus, type 2 (HCC) 04/03/2012   HIGH RISK FEET.. Please have patient take shoes and socks off every visit for visual foot inspection.     Eczema    Elevated alkaline phosphatase level    GGT and 5'nucleotidase 8/13 normal   Health care maintenance 01/22/2013   Surgically absent cervix- no pap needed (Path report 07/2000: uterine body with attached bilateral  adnexa and separate cervix.)   History of oxygen  administration    oxygen  @ 2 l/m nasally bedtime 24/7   HTN (hypertension)    Hx of cardiac cath    a. LHC 2003 normal;  b. LHC 6/13:  Mild  calcification in the LM, o/w normal coronary arteries, EF 45%.    Hyperkalemia 08/08/2017   Hyperlipidemia    Elevated triglyceride in 2019   Hyperlipidemia associated with type 2 diabetes mellitus (HCC) 05/25/2007   Qualifier: Diagnosis of  By: Gladis FNP, Nykedtra     Hyperthyroidism, subclinical    HYPERTHYROIDISM, SUBCLINICAL 05/06/2009   Qualifier: Diagnosis of  By: Rolan Hough     Implantable cardioverter-defibrillator (ICD) generator end of life    Lipoma    Need for hepatitis C screening test 04/25/2019   NICM (nonischemic cardiomyopathy) (HCC)    Obesity    On home oxygen  therapy    2L; 24/7 (10/11/2016)   Post menopausal syndrome    Shortness of breath 07/14/2017   Skin rash 07/12/2018   Sleep apnea    pt denies 04/12/2013    Current Outpatient Medications on File Prior to Visit  Medication Sig Dispense Refill   albuterol  (VENTOLIN  HFA) 108 (90 Base) MCG/ACT inhaler Inhale 2 puffs into the lungs every 6 (six) hours as needed for wheezing or shortness of breath. 8 g 2   ANORO ELLIPTA  62.5-25 MCG/ACT AEPB INHALE 1 PUFF INTO THE LUNGS DAILY 60 each 2   apixaban  (ELIQUIS ) 5 MG TABS tablet Take 1 tablet (5 mg total) by mouth 2 (two) times daily. 60 tablet 2   atorvastatin  (  LIPITOR ) 80 MG tablet Take 1 tablet (80 mg total) by mouth at bedtime. 90 tablet 3   bisoprolol  (ZEBETA ) 5 MG tablet Take 0.5 tablets (2.5 mg total) by mouth daily. 15 tablet 0   calcium -vitamin D  (OSCAL WITH D) 500-200 MG-UNIT tablet Take 1 tablet by mouth 2 (two) times daily. 90 tablet 6   dapagliflozin  propanediol (FARXIGA ) 10 MG TABS tablet Take 1 tablet (10 mg total) by mouth daily. 90 tablet 1   insulin  degludec (TRESIBA ) 100 UNIT/ML FlexTouch Pen Inject 5 Units into the skin daily.     ketoconazole  (NIZORAL ) 2 % shampoo Apply 1 Application topically once a week.     levocetirizine (XYZAL ) 5 MG tablet Take 1 tablet (5 mg total) by mouth at bedtime. 90 tablet 3   OXYGEN  Inhale 2 L into the lungs at  bedtime.     OZEMPIC , 1 MG/DOSE, 4 MG/3ML SOPN Inject 1 mg into the skin once a week. Takes on Saturdays     potassium chloride  SA (KLOR-CON  M) 20 MEQ tablet Take 20 mEq by mouth 2 (two) times daily. (Patient not taking: Reported on 05/03/2024)     sodium zirconium cyclosilicate  (LOKELMA ) 10 g PACK packet Take 10 g by mouth 3 (three) times daily. 3 packet 0   spironolactone  (ALDACTONE ) 25 MG tablet Take 0.5 tablets (12.5 mg total) by mouth daily. (Patient not taking: Reported on 05/03/2024) 30 tablet 0   torsemide  (DEMADEX ) 20 MG tablet Take 4 tablets (80 mg total) by mouth daily as needed (Take if you gain 3 pounds in one day or 5 pounds in a week).     triamcinolone  ointment (KENALOG ) 0.1 % Apply 1 Application topically 2 (two) times daily. Mix with Eucerin cream     No current facility-administered medications on file prior to visit.    Review of Systems: ROS negative except for what is noted on the assessment and plan.  There were no vitals filed for this visit. {Labs (Optional):23779} {Vitals History (Optional):23777}  Physical Exam: Constitutional: NAD Cardiovascular: RRR, no murmurs. Pulmonary/Chest: Clear bilateral lungs Abdominal: soft, non-tender, non-distended.  Assessment & Plan:   Patient {GC/GE:[id number]::discussed with,seen with} Dr. {[id number]::Tpoopjfd,Z. Hoffman,Chambliss, Winfrey,Lau,Machen}  Assessment & Plan Hyperkalemia Likely etiology hemolyzed blood vs medication induced.  Spironolactone  and potassium supplements have been held.  Denies muscle weakness. - Multiple lung nodules Additionally, during her hospitalization, multiple lung nodules were incidentally noted.  Repeat chest CT without contrast scheduled for 8/5.  Health maintenance screening Mammography scheduled for 8/11.  No orders of the defined types were placed in this encounter.   [name], MD [facility] Internal Medicine, PGY-2  Date 05/06/2024 Time 8:33 AM

## 2024-05-06 NOTE — Patient Instructions (Addendum)
 It was a pleasure taking care of you today!    Ankle pain may be related to swelling -- start torsemide  20 mg daily. 2.  Hold potassium supplement and  spirolactone  until cleared by cardiology.  3. If swelling or leg pain worsens, or if you develop fever, please go to the nearest emergency room.  I have ordered the following labs for you:   Lab Orders         CBC         BMP8         Sed Rate (ESR)         CRP (C-Reactive Protein)       Follow up: 3-4 DAYS in pain does not improve   Should you have any questions or concerns please call the internal medicine clinic at [phone].     [name], MD  [facility]

## 2024-05-06 NOTE — Addendum Note (Signed)
 Addended by: ANTONE DWAYNE SAILOR on: 05/06/2024 03:44 PM   Modules accepted: Orders

## 2024-05-06 NOTE — Assessment & Plan Note (Addendum)
 Slightly volume overloaded as evidenced by bilateral swelling in the legs.  Restarted torsemide  dose. -Scheduled for cardiology follow-up.

## 2024-05-06 NOTE — Assessment & Plan Note (Deleted)
 Additionally, during her hospitalization, multiple lung nodules were incidentally noted.  Repeat chest CT without contrast scheduled for 8/5.

## 2024-05-06 NOTE — Patient Instructions (Signed)
 Medication Changes:  RESTART TORSEMIDE  20MG  EVERY MONDAY, WEDNESDAY, AND FRIDAY   Lab Work:  None Ordered At This Time.   Follow-Up in: 4 MONTHS PLEASE CALL OUR OFFICE AROUND OCTOBER TO GET SCHEDULED FOR YOUR APPOINTMENT. PHONE NUMBER IS [phone] OPTION 2   At the [facility], you and your health needs are our priority. We have a designated team specialized in the treatment of Heart Failure. This Care Team includes your primary Heart Failure Specialized Cardiologist (physician), Advanced Practice Providers (APPs- Physician Assistants and Nurse Practitioners), and Pharmacist who all work together to provide you with the care you need, when you need it.   You may see any of the following providers on your designated Care Team at your next follow up:  Dr. Toribio Fuel Dr. Ezra Shuck Dr. Ria Commander Dr. Odis Brownie Greig Mosses, NP Caffie Shed, [place] [facility] [place], [place] Beckey Coe, NP [place] Lee, NP Tinnie Redman, PharmD   Please be sure to bring in all your medications bottles to every appointment.   Need to Contact Us :  If you have any questions or concerns before your next appointment please send us  a message through [place] or call our office at [phone].    TO LEAVE A MESSAGE FOR THE NURSE SELECT OPTION 2, PLEASE LEAVE A MESSAGE INCLUDING: YOUR NAME DATE OF BIRTH CALL BACK NUMBER REASON FOR CALL**this is important as we prioritize the call backs  YOU WILL RECEIVE A CALL BACK THE SAME DAY AS LONG AS YOU CALL BEFORE 4:00 PM

## 2024-05-06 NOTE — Assessment & Plan Note (Addendum)
 Hyperkalemia noted 2 weeks ago was likely secondary to hemolyzed blood sample. Potassium was 5.9, and serum creatinine had increased to 2.39 from a baseline of 1.81. Patient reports compliance with holding both spironolactone  and potassium supplementation. - BMP reviewed as above - Continue to hold spironolactone  and potassium supplementation

## 2024-05-06 NOTE — Addendum Note (Signed)
 Addended by: KARNA FELLOWS on: 05/06/2024 04:18 PM   Modules accepted: Level of Service

## 2024-05-07 ENCOUNTER — Observation Stay (HOSPITAL_COMMUNITY)

## 2024-05-07 ENCOUNTER — Encounter (HOSPITAL_COMMUNITY): Payer: Self-pay

## 2024-05-07 ENCOUNTER — Ambulatory Visit: Payer: Self-pay | Admitting: Student

## 2024-05-07 ENCOUNTER — Ambulatory Visit (HOSPITAL_COMMUNITY)

## 2024-05-07 ENCOUNTER — Other Ambulatory Visit: Payer: Self-pay

## 2024-05-07 ENCOUNTER — Encounter (HOSPITAL_COMMUNITY): Payer: Self-pay | Admitting: Internal Medicine

## 2024-05-07 ENCOUNTER — Inpatient Hospital Stay (HOSPITAL_COMMUNITY)
Admission: EM | Admit: 2024-05-07 | Discharge: 2024-05-13 | DRG: 291 | Disposition: A | Discharge status: Skilled Nursing Facility | Attending: Internal Medicine | Admitting: Internal Medicine

## 2024-05-07 DIAGNOSIS — Z823 Family history of stroke: Secondary | ICD-10-CM

## 2024-05-07 DIAGNOSIS — Z91018 Allergy to other foods: Secondary | ICD-10-CM

## 2024-05-07 DIAGNOSIS — T502X5A Adverse effect of carbonic-anhydrase inhibitors, benzothiadiazides and other diuretics, initial encounter: Secondary | ICD-10-CM | POA: Diagnosis present

## 2024-05-07 DIAGNOSIS — I5043 Acute on chronic combined systolic (congestive) and diastolic (congestive) heart failure: Secondary | ICD-10-CM | POA: Diagnosis present

## 2024-05-07 DIAGNOSIS — Z886 Allergy status to analgesic agent status: Secondary | ICD-10-CM

## 2024-05-07 DIAGNOSIS — Z794 Long term (current) use of insulin: Secondary | ICD-10-CM

## 2024-05-07 DIAGNOSIS — N3001 Acute cystitis with hematuria: Secondary | ICD-10-CM | POA: Diagnosis present

## 2024-05-07 DIAGNOSIS — Z555 Less than a high school diploma: Secondary | ICD-10-CM

## 2024-05-07 DIAGNOSIS — E876 Hypokalemia: Secondary | ICD-10-CM | POA: Diagnosis present

## 2024-05-07 DIAGNOSIS — I071 Rheumatic tricuspid insufficiency: Secondary | ICD-10-CM | POA: Diagnosis present

## 2024-05-07 DIAGNOSIS — Z6830 Body mass index (BMI) 30.0-30.9, adult: Secondary | ICD-10-CM

## 2024-05-07 DIAGNOSIS — N184 Chronic kidney disease, stage 4 (severe): Secondary | ICD-10-CM | POA: Diagnosis present

## 2024-05-07 DIAGNOSIS — I4892 Unspecified atrial flutter: Secondary | ICD-10-CM | POA: Diagnosis present

## 2024-05-07 DIAGNOSIS — Z95810 Presence of automatic (implantable) cardiac defibrillator: Secondary | ICD-10-CM

## 2024-05-07 DIAGNOSIS — I428 Other cardiomyopathies: Secondary | ICD-10-CM | POA: Diagnosis present

## 2024-05-07 DIAGNOSIS — J449 Chronic obstructive pulmonary disease, unspecified: Secondary | ICD-10-CM | POA: Diagnosis present

## 2024-05-07 DIAGNOSIS — B961 Klebsiella pneumoniae [K. pneumoniae] as the cause of diseases classified elsewhere: Secondary | ICD-10-CM | POA: Diagnosis present

## 2024-05-07 DIAGNOSIS — D72829 Elevated white blood cell count, unspecified: Secondary | ICD-10-CM

## 2024-05-07 DIAGNOSIS — K3184 Gastroparesis: Secondary | ICD-10-CM | POA: Diagnosis present

## 2024-05-07 DIAGNOSIS — N179 Acute kidney failure, unspecified: Secondary | ICD-10-CM | POA: Diagnosis present

## 2024-05-07 DIAGNOSIS — E1122 Type 2 diabetes mellitus with diabetic chronic kidney disease: Secondary | ICD-10-CM | POA: Diagnosis present

## 2024-05-07 DIAGNOSIS — Z820 Family history of epilepsy and other diseases of the nervous system: Secondary | ICD-10-CM

## 2024-05-07 DIAGNOSIS — Z833 Family history of diabetes mellitus: Secondary | ICD-10-CM

## 2024-05-07 DIAGNOSIS — E785 Hyperlipidemia, unspecified: Secondary | ICD-10-CM | POA: Diagnosis present

## 2024-05-07 DIAGNOSIS — Z7951 Long term (current) use of inhaled steroids: Secondary | ICD-10-CM

## 2024-05-07 DIAGNOSIS — K760 Fatty (change of) liver, not elsewhere classified: Secondary | ICD-10-CM | POA: Diagnosis present

## 2024-05-07 DIAGNOSIS — E1165 Type 2 diabetes mellitus with hyperglycemia: Secondary | ICD-10-CM | POA: Diagnosis present

## 2024-05-07 DIAGNOSIS — J9610 Chronic respiratory failure, unspecified whether with hypoxia or hypercapnia: Secondary | ICD-10-CM | POA: Diagnosis present

## 2024-05-07 DIAGNOSIS — I5082 Biventricular heart failure: Secondary | ICD-10-CM | POA: Diagnosis present

## 2024-05-07 DIAGNOSIS — R0602 Shortness of breath: Secondary | ICD-10-CM | POA: Diagnosis not present

## 2024-05-07 DIAGNOSIS — Z9981 Dependence on supplemental oxygen: Secondary | ICD-10-CM

## 2024-05-07 DIAGNOSIS — E1143 Type 2 diabetes mellitus with diabetic autonomic (poly)neuropathy: Secondary | ICD-10-CM | POA: Diagnosis present

## 2024-05-07 DIAGNOSIS — Z79899 Other long term (current) drug therapy: Secondary | ICD-10-CM

## 2024-05-07 DIAGNOSIS — Z8249 Family history of ischemic heart disease and other diseases of the circulatory system: Secondary | ICD-10-CM

## 2024-05-07 DIAGNOSIS — Z7985 Long-term (current) use of injectable non-insulin antidiabetic drugs: Secondary | ICD-10-CM

## 2024-05-07 DIAGNOSIS — Z885 Allergy status to narcotic agent status: Secondary | ICD-10-CM

## 2024-05-07 DIAGNOSIS — Z7901 Long term (current) use of anticoagulants: Secondary | ICD-10-CM

## 2024-05-07 DIAGNOSIS — Z888 Allergy status to other drugs, medicaments and biological substances status: Secondary | ICD-10-CM

## 2024-05-07 DIAGNOSIS — R918 Other nonspecific abnormal finding of lung field: Secondary | ICD-10-CM | POA: Diagnosis present

## 2024-05-07 DIAGNOSIS — I130 Hypertensive heart and chronic kidney disease with heart failure and stage 1 through stage 4 chronic kidney disease, or unspecified chronic kidney disease: Principal | ICD-10-CM | POA: Diagnosis present

## 2024-05-07 DIAGNOSIS — Z825 Family history of asthma and other chronic lower respiratory diseases: Secondary | ICD-10-CM

## 2024-05-07 DIAGNOSIS — I5023 Acute on chronic systolic (congestive) heart failure: Principal | ICD-10-CM | POA: Diagnosis present

## 2024-05-07 DIAGNOSIS — F17210 Nicotine dependence, cigarettes, uncomplicated: Secondary | ICD-10-CM | POA: Diagnosis present

## 2024-05-07 DIAGNOSIS — I480 Paroxysmal atrial fibrillation: Secondary | ICD-10-CM | POA: Diagnosis present

## 2024-05-07 DIAGNOSIS — E669 Obesity, unspecified: Secondary | ICD-10-CM | POA: Diagnosis present

## 2024-05-07 DIAGNOSIS — D75839 Thrombocytosis, unspecified: Secondary | ICD-10-CM | POA: Diagnosis present

## 2024-05-07 DIAGNOSIS — Z803 Family history of malignant neoplasm of breast: Secondary | ICD-10-CM

## 2024-05-07 DIAGNOSIS — Z860100 Personal history of colon polyps, unspecified: Secondary | ICD-10-CM

## 2024-05-07 DIAGNOSIS — M810 Age-related osteoporosis without current pathological fracture: Secondary | ICD-10-CM | POA: Diagnosis present

## 2024-05-07 LAB — C-REACTIVE PROTEIN: CRP: 22.7 mg/dL — ABNORMAL HIGH (ref <1.0)

## 2024-05-07 LAB — BASIC METABOLIC PANEL WITH GFR
Anion gap: 18 — ABNORMAL HIGH (ref 5–15)
Anion gap: 16 — ABNORMAL HIGH (ref 5–15)
BUN: 49 mg/dL — ABNORMAL HIGH (ref 8–23)
BUN: 50 mg/dL — ABNORMAL HIGH (ref 8–23)
CO2: 24 mmol/L (ref 22–32)
CO2: 25 mmol/L (ref 22–32)
Calcium: 8.5 mg/dL — ABNORMAL LOW (ref 8.9–10.3)
Calcium: 9.1 mg/dL (ref 8.9–10.3)
Chloride: 94 mmol/L — ABNORMAL LOW (ref 98–111)
Chloride: 96 mmol/L — ABNORMAL LOW (ref 98–111)
Creatinine, Ser: 3.3 mg/dL — ABNORMAL HIGH (ref 0.44–1.00)
Creatinine, Ser: 2.93 mg/dL — ABNORMAL HIGH (ref 0.44–1.00)
GFR, Estimated: 14 mL/min — ABNORMAL LOW (ref >60)
GFR, Estimated: 17 mL/min — ABNORMAL LOW (ref >60)
Glucose, Bld: 233 mg/dL — ABNORMAL HIGH (ref 70–99)
Glucose, Bld: 262 mg/dL — ABNORMAL HIGH (ref 70–99)
Potassium: 2.9 mmol/L — ABNORMAL LOW (ref 3.5–5.1)
Potassium: 2.8 mmol/L — ABNORMAL LOW (ref 3.5–5.1)
Sodium: 136 mmol/L (ref 135–145)
Sodium: 137 mmol/L (ref 135–145)

## 2024-05-07 LAB — CBC
HCT: 38.9 % (ref 36.0–46.0)
Hemoglobin: 12.1 g/dL (ref 12.0–15.0)
MCH: 25.1 pg — ABNORMAL LOW (ref 26.0–34.0)
MCHC: 31.1 g/dL (ref 30.0–36.0)
MCV: 80.5 fL (ref 80.0–100.0)
Platelets: 246 K/uL (ref 150–400)
RBC: 4.83 MIL/uL (ref 3.87–5.11)
RDW: 15.6 % — ABNORMAL HIGH (ref 11.5–15.5)
WBC: 13.1 K/uL — ABNORMAL HIGH (ref 4.0–10.5)
nRBC: 0 % (ref 0.0–0.2)

## 2024-05-07 LAB — GLUCOSE, CAPILLARY
Glucose-Capillary: 325 mg/dL — ABNORMAL HIGH (ref 70–99)
Glucose-Capillary: 285 mg/dL — ABNORMAL HIGH (ref 70–99)
Glucose-Capillary: 90 mg/dL (ref 70–99)

## 2024-05-07 LAB — MAGNESIUM: Magnesium: 1.9 mg/dL (ref 1.7–2.4)

## 2024-05-07 LAB — BRAIN NATRIURETIC PEPTIDE: B Natriuretic Peptide: 45.5 pg/mL (ref 0.0–100.0)

## 2024-05-07 MED ORDER — SENNOSIDES-DOCUSATE SODIUM 8.6-50 MG PO TABS
1.0000 | ORAL_TABLET | Freq: Every evening | ORAL | Status: DC | PRN
  Administered 2024-05-10: 1 via ORAL
  Filled 2024-05-07: qty 1

## 2024-05-07 MED ORDER — APIXABAN 5 MG PO TABS
5.0000 mg | ORAL_TABLET | Freq: Two times a day (BID) | ORAL | Status: DC
Start: 2024-05-07 — End: 2024-05-13
  Administered 2024-05-07 – 2024-05-13 (×14): 5 mg via ORAL
  Filled 2024-05-07 (×13): qty 1

## 2024-05-07 MED ORDER — POTASSIUM CHLORIDE CRYS ER 20 MEQ PO TBCR
40.0000 meq | EXTENDED_RELEASE_TABLET | ORAL | Status: AC
  Administered 2024-05-07 (×2): 40 meq via ORAL
  Filled 2024-05-07 (×2): qty 2

## 2024-05-07 MED ORDER — ACETAMINOPHEN 325 MG PO TABS
650.0000 mg | ORAL_TABLET | Freq: Four times a day (QID) | ORAL | Status: DC | PRN
  Administered 2024-05-07 – 2024-05-10 (×5): 650 mg via ORAL
  Filled 2024-05-07 (×5): qty 2

## 2024-05-07 MED ORDER — LACTATED RINGERS IV SOLN: INTRAVENOUS | Status: DC

## 2024-05-07 MED ORDER — ATORVASTATIN CALCIUM 80 MG PO TABS
80.0000 mg | ORAL_TABLET | Freq: Every day | ORAL | Status: DC
  Administered 2024-05-07 – 2024-05-12 (×6): 80 mg via ORAL
  Filled 2024-05-07 (×6): qty 1

## 2024-05-07 MED ORDER — UMECLIDINIUM-VILANTEROL 62.5-25 MCG/ACT IN AEPB
1.0000 | INHALATION_SPRAY | Freq: Every day | RESPIRATORY_TRACT | Status: DC
  Administered 2024-05-08 – 2024-05-13 (×7): 1 via RESPIRATORY_TRACT
  Filled 2024-05-07: qty 14

## 2024-05-07 MED ORDER — INSULIN ASPART 100 UNIT/ML IJ SOLN
0.0000 [IU] | Freq: Three times a day (TID) | INTRAMUSCULAR | Status: DC
  Administered 2024-05-07: 8 [IU] via SUBCUTANEOUS
  Administered 2024-05-08: 5 [IU] via SUBCUTANEOUS
  Administered 2024-05-08: 8 [IU] via SUBCUTANEOUS
  Administered 2024-05-08: 2 [IU] via SUBCUTANEOUS
  Administered 2024-05-09: 5 [IU] via SUBCUTANEOUS
  Administered 2024-05-09: 15 [IU] via SUBCUTANEOUS
  Administered 2024-05-09: 5 [IU] via SUBCUTANEOUS
  Administered 2024-05-10: 3 [IU] via SUBCUTANEOUS
  Administered 2024-05-10 (×2): 11 [IU] via SUBCUTANEOUS
  Administered 2024-05-11: 2 [IU] via SUBCUTANEOUS

## 2024-05-07 MED ORDER — ACETAMINOPHEN 650 MG RE SUPP: 650.0000 mg | Freq: Four times a day (QID) | RECTAL | Status: DC | PRN

## 2024-05-07 MED ORDER — FUROSEMIDE 10 MG/ML IJ SOLN: 80.0000 mg | Freq: Once | INTRAMUSCULAR | Status: DC

## 2024-05-07 NOTE — H&P (Cosign Needed Addendum)
 Date: 05/07/2024               Patient Name:  [name] MRN: [id number]  DOB: 1951/01/01 Age / Sex: 73 y.o., female   PCP: [name], MD         Medical Service: Internal Medicine Teaching Service         Attending Physician: Dr. MICAEL Riis Winfrey      First Contact: Doyal Miyamoto, MD}    Second Contact: Dr. Fairy Pool, DO          Pager Information: First Contact Pager: [phone]   Second Contact Pager: [phone]   SUBJECTIVE   Chief Complaint: Painful leg swelling  History of Present Illness: [name] is a 73 y.o. female with PMH of HFrEF (04/2024 EF: 20-25%), CKD 4, COPD, HTN, T2DM, HLD, PAF on Eliquis , and R ankle fx s/p ORIF who presents following abnormal labs and shortness of breath and admitted for AKI on CKD in the setting of acute on chronic heart failure.   She was previously seen on 7/16 after an episode of presyncope preceded by a week of shortness of breath. She believed the episode was due to overheating. At that time, there was no evidence of MI, CHF exacerbation, COPD exacerbation or DVT/PE. CXR showed a cluster of micronodules with some concern for infection but was not worked up due to absence of fever, cough or leukocytosis. A f/u CT was scheduled for 8/11. Echo showed EF of 20-25%. She was found to have a prerenal AKI likely due to diuretics and decreased oral intake, which improved after discontinuation of torsemide . Shortness of breath and leg swelling had also improved prior to discharge. She did not resume the torsemide  following discharge.   She followed up with PCP on 7/21, and BMP showed hyperkalemia. She was advised to hold her potassium supplements and spironolactone  and restart her torsemide . However, on 7/23 a repeat BMP found persistent hyperkalemia and elevated Scr. She was advised to stop the torsemide  and was given 3 doses of Lokelma . She was supposed to follow up for repeat labs, though did not have this completed until she presented to [facility]  clinic on 8/4.   She was seen again in [facility] on 8/4 with the complaint of worsening ankle swelling in the setting of stopping diuretic therapy. BMP showed hypokalemia and elevated Scr at 3.30 (BL 1.81). Cardiology saw her on the same day and noted HFrEF was stable, chronic LE edema has a component of venous stasis, and recommended restarting torsemide  MWF.   It was determined her AKI was likely a result of hypoperfusion from fluid overload. She was directly admitted to the hospital for IV diuresis. Today, she reports increased difficulty walking due to swelling and pain on bilateral lower extremities and worsening shortness of breath with exertion. She reports intermittent palpitations, chills, decreased appetite, thirst and decreased urine output. She notes that her urine is dark in color, but not having any pain or burning with urination, and drinks only about 1 cup of water a day recently. She is on 2L O2 via [place] at baseline, noting she has not experienced an increase need. She also denies chest pain, fever, cough, congestion, orthopnea, PND, dyuria or hematuria.   Labs: (Pending) CBC BMP BNP Magnesium     Past Medical History Patient Active Problem List   Diagnosis Date Noted   Acute on chronic HFrEF (heart failure with reduced ejection fraction) (HCC) 05/07/2024   Pain and swelling of ankle 05/06/2024  Multiple lung nodules 04/23/2024   QT prolongation 04/15/2024   Acute on chronic hypoxic respiratory failure (HCC) 04/13/2024   Right ankle swelling 03/26/2024   Small vessel disease, cerebrovascular 12/28/2023   Esophageal dysphagia 10/10/2022   Preventative health care 08/18/2022   CKD (chronic kidney disease) stage 4, GFR 15-29 ml/min (HCC) 07/26/2022   COPD (chronic obstructive pulmonary disease) (HCC) 07/26/2022   Obesity (BMI 30-39.9) 07/26/2022   Paroxysmal atrial flutter (HCC) 07/25/2022   Multiple thyroid  nodules 07/04/2022   Recurrent falls 07/04/2022   Hepatic steatosis  07/28/2020   Hyperlipidemia    Osteoporosis 02/12/2020   Intrinsic atopic dermatitis 09/19/2019   Hyperkalemia 08/08/2017   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   Acute on chronic systolic heart failure (HCC) 03/18/2016   Spinal stenosis of lumbar region 12/09/2015   Restrictive lung disease 06/24/2015   Diabetic gastroparesis associated with type 2 diabetes mellitus (HCC) 07/12/2014   Depression 05/22/2014   DM (diabetes mellitus) type II controlled with renal manifestation (HCC) 04/03/2012   Tobacco use disorder 10/26/2009   Essential hypertension 05/25/2007   Chronic combined systolic and diastolic heart failure (HCC) 05/25/2007   ICD (implantable cardioverter-defibrillator) in place 05/04/2007    Past Surgical History Past Surgical History:  Procedure Laterality Date   ABDOMINAL HYSTERECTOMY     CARDIAC CATHETERIZATION     CARDIAC CATHETERIZATION N/A 06/21/2016   Procedure: Left Heart Cath and Coronary Angiography;  Surgeon: Ozell Fell, MD;  Location: [facility] INVASIVE CV LAB;  Service: Cardiovascular;  Laterality: N/A;   CARDIAC DEFIBRILLATOR PLACEMENT  05/04/2007   SJM Atlas II VR ICD   CARDIAC DEFIBRILLATOR PLACEMENT     CATARACT EXTRACTION     od   COLONOSCOPY N/A 04/12/2013   Procedure: COLONOSCOPY;  Surgeon: Belvie JONETTA Just, MD;  Location: [facility] ENDOSCOPY;  Service: Endoscopy;  Laterality: N/A;  pt.has defibrilator   COLONOSCOPY N/A 04/16/2015   Procedure: COLONOSCOPY;  Surgeon: Toribio SHAUNNA Cedar, MD;  Location: [facility] ENDOSCOPY;  Service: Endoscopy;  Laterality: N/A;   COLONOSCOPY WITH PROPOFOL  N/A 07/21/2023   Procedure: COLONOSCOPY WITH PROPOFOL ;  Surgeon: Just Belvie, MD;  Location: [facility] ENDOSCOPY;  Service: Gastroenterology;  Laterality: N/A;   ESOPHAGOGASTRODUODENOSCOPY N/A 04/16/2015   Procedure: ESOPHAGOGASTRODUODENOSCOPY (EGD);  Surgeon: Toribio SHAUNNA Cedar, MD;  Location: [facility];  Service: Endoscopy;  Laterality: N/A;   ESOPHAGOGASTRODUODENOSCOPY (EGD) WITH  PROPOFOL  N/A 11/25/2022   Procedure: ESOPHAGOGASTRODUODENOSCOPY (EGD) WITH PROPOFOL ;  Surgeon: Just Belvie, MD;  Location: [facility];  Service: Gastroenterology;  Laterality: N/A;   EYE SURGERY     HERNIA REPAIR     HYSTEROSCOPY     IMPLANTABLE CARDIOVERTER DEFIBRILLATOR (ICD) GENERATOR CHANGE N/A 04/02/2014   Procedure: ICD GENERATOR CHANGE;  Surgeon: Danelle LELON Birmingham, MD;  Location: [facility] CATH LAB;  Service: Cardiovascular;  Laterality: N/A;   INSERT / REPLACE / REMOVE PACEMAKER     LEFT HEART CATHETERIZATION WITH CORONARY ANGIOGRAM N/A 04/02/2012   Procedure: LEFT HEART CATHETERIZATION WITH CORONARY ANGIOGRAM;  Surgeon: Debby JONETTA Como, MD;  Location: [facility] CATH LAB;  Service: Cardiovascular;  Laterality: N/A;   ORIF ANKLE FRACTURE Right 08/12/2019   Procedure: OPEN REDUCTION INTERNAL FIXATION (ORIF) RIGHT TRIMALLEOLAR ANKLE FRACTURE;  Surgeon: Jerri Kay HERO, MD;  Location: [facility] OR;  Service: Orthopedics;  Laterality: Right;   RIGHT HEART CATH N/A 08/03/2022   Procedure: RIGHT HEART CATH;  Surgeon: Cherrie Toribio SAUNDERS, MD;  Location: [facility] INVASIVE CV LAB;  Service: Cardiovascular;  Laterality: N/A;   SAVORY DILATION N/A 11/25/2022   Procedure: SAVORY DILATION;  Surgeon: Rollin Dover, MD;  Location: [facility];  Service: Gastroenterology;  Laterality: N/A;   TEE WITHOUT CARDIOVERSION N/A 06/23/2022   Procedure: TRANSESOPHAGEAL ECHOCARDIOGRAM (TEE);  Surgeon: Loni Soyla LABOR, MD;  Location: [facility] ENDOSCOPY;  Service: Cardiology;  Laterality: N/A;   TONSILLECTOMY     TUBAL LIGATION      Meds:  -Anoro inhaler daily  -Eliquis  5 mg BID -atorvastatin  80 mg daily  -bisoprolol  2.5 mg daily  -Farxiga  10 mg daily  -Tresiba  5 units daily  -Levocetirizine 5 mg daiy  -Ozempic  1 mg weekly  -K supplement NO -Spirolactone 12.5 mg daily  -torsemide  (last Sunday 40 mg) Current Meds  Medication Sig   albuterol  (VENTOLIN  HFA) 108 (90 Base) MCG/ACT inhaler Inhale 2 puffs into the lungs every 6 (six) hours as  needed for wheezing or shortness of breath.   ANORO ELLIPTA  62.5-25 MCG/ACT AEPB INHALE 1 PUFF INTO THE LUNGS DAILY   apixaban  (ELIQUIS ) 5 MG TABS tablet Take 1 tablet (5 mg total) by mouth 2 (two) times daily.   atorvastatin  (LIPITOR ) 80 MG tablet Take 1 tablet (80 mg total) by mouth at bedtime.   bisoprolol  (ZEBETA ) 5 MG tablet Take 0.5 tablets (2.5 mg total) by mouth daily.   calcium -vitamin D  (OSCAL WITH D) 500-200 MG-UNIT tablet Take 1 tablet by mouth 2 (two) times daily.   Continuous Glucose Sensor (DEXCOM G7 SENSOR) MISC Inject 1 Application into the skin. Every 10 days   dapagliflozin  propanediol (FARXIGA ) 10 MG TABS tablet Take 1 tablet (10 mg total) by mouth daily.   insulin  degludec (TRESIBA ) 100 UNIT/ML FlexTouch Pen Inject 5 Units into the skin daily.   ketoconazole  (NIZORAL ) 2 % shampoo Apply 1 Application topically once a week.   levocetirizine (XYZAL ) 5 MG tablet Take 1 tablet (5 mg total) by mouth at bedtime.   OXYGEN  Inhale 2 L into the lungs at bedtime.   OZEMPIC , 1 MG/DOSE, 4 MG/3ML SOPN Inject 1 mg into the skin once a week. Takes on Saturdays   sodium zirconium cyclosilicate  (LOKELMA ) 10 g PACK packet Take 10 g by mouth 3 (three) times daily.   spironolactone  (ALDACTONE ) 25 MG tablet Take 0.5 tablets (12.5 mg total) by mouth daily.   torsemide  (DEMADEX ) 20 MG tablet Take 1 tablet (20 mg total) by mouth every Monday, Wednesday, and Friday.   triamcinolone  ointment (KENALOG ) 0.1 % Apply 1 Application topically 2 (two) times daily. Mix with Eucerin cream   [DISCONTINUED] Sod Picosulfate-Mag Ox-Cit Acd (CLENPIQ) 10-3.5-12 MG-GM -GM/175ML SOLN Take 175 mLs by mouth in the morning, at noon, and at bedtime.    Social:  -Lives alone  -Independent with ADL but difficulty now, uses wheelchair  -Support with friends to assist her  -Substances: Current 1/2 pack/day smoker, 24-pack-year history -Occasional alcohol use -No current or prior drug use  Family History:  Family  History  Problem Relation Age of Onset   Stroke Mother    Seizures Father    Heart disease Father    Diabetes Sister    Asthma Maternal Aunt        aunts   Asthma Maternal Uncle        uncles   Heart disease Paternal Aunt        aunts   Heart disease Paternal Uncle        uncles   Heart disease Maternal Aunt        aunts   Heart disease Maternal Uncle        uncles  Heart disease Maternal Grandfather    Breast cancer Daughter    Breast cancer Cousin    Asthma Grandchild    Colon cancer Neg Hx    Colon polyps Neg Hx    Esophageal cancer Neg Hx    Kidney disease Neg Hx    Gallbladder disease Neg Hx      Allergies: Allergies as of 05/07/2024 - Review Complete 05/07/2024  Allergen Reaction Noted   Actos [pioglitazone] Other (See Comments) 03/12/2009   Naproxen  Other (See Comments) 02/05/2016   Rosiglitazone Hives 02/11/2016   Metformin  and related  04/18/2023   Tomato Itching 06/16/2021   Hydrocodone -acetaminophen  Itching 08/08/2019   Hydrocortisone Hives and Itching 10/06/2009    Review of Systems: A complete ROS was negative except as per HPI.   OBJECTIVE:   Physical Exam: Blood pressure (!) 143/91, pulse 91, temperature 97.7 F (36.5 C), resp. rate 16, height 5' 2 (1.575 m), weight 78 kg, SpO2 98%.  Constitutional: well-appearing pleasant woman sitting with head of bed raised, in no acute distress HENT: normocephalic atraumatic, mucous membranes moist Eyes: conjunctiva non-erythematous Neck: No JVD appreciated Cardiovascular: regular rate and rhythm, murmur present Pulmonary/Chest: mildly increased work of breathing on room air, lungs clear to auscultation bilaterally Abdominal: soft, non-tender, non-distended MSK: mild BLE tender non-pitting edema mostly around ankles, DP palpated bilaterally Neurological: alert & oriented x 3 Skin: warm and dry  Labs: CBC    Component Value Date/Time   WBC 13.1 (H) 05/07/2024 1436   RBC 4.83 05/07/2024 1436   HGB  12.1 05/07/2024 1436   HGB 14.1 02/08/2017 1551   HCT 38.9 05/07/2024 1436   HCT 44.5 02/08/2017 1551   PLT 246 05/07/2024 1436   PLT 246 02/08/2017 1551   MCV 80.5 05/07/2024 1436   MCV 87 02/08/2017 1551   MCH 25.1 (L) 05/07/2024 1436   MCHC 31.1 05/07/2024 1436   RDW 15.6 (H) 05/07/2024 1436   RDW 16.7 (H) 02/08/2017 1551   LYMPHSABS 2.0 04/13/2024 1856   LYMPHSABS 2.6 02/08/2017 1551   MONOABS 0.8 04/13/2024 1856   EOSABS 0.3 04/13/2024 1856   EOSABS 0.3 02/08/2017 1551   BASOSABS 0.1 04/13/2024 1856   BASOSABS 0.0 02/08/2017 1551     CMP     Component Value Date/Time   NA 136 05/06/2024 1455   NA 141 04/24/2024 1048   K 2.9 (L) 05/06/2024 1455   CL 94 (L) 05/06/2024 1455   CO2 24 05/06/2024 1455   GLUCOSE 233 (H) 05/06/2024 1455   BUN 49 (H) 05/06/2024 1455   BUN 34 (H) 04/24/2024 1048   CREATININE 3.30 (H) 05/06/2024 1455   CREATININE 0.88 06/16/2016 1338   CALCIUM  8.5 (L) 05/06/2024 1455   PROT 7.4 04/13/2024 1856   PROT 6.9 02/08/2017 1551   ALBUMIN  3.5 04/13/2024 1856   ALBUMIN  4.1 02/08/2017 1551   AST 17 04/13/2024 1856   ALT 13 04/13/2024 1856   ALKPHOS 99 04/13/2024 1856   BILITOT 0.5 04/13/2024 1856   BILITOT 0.4 02/08/2017 1551   GFRNONAA 14 (L) 05/06/2024 1455   GFRNONAA 75 02/13/2015 1015   GFRAA 78 07/14/2020 1536   GFRAA 87 02/13/2015 1015    Imaging: No results found.   EKG: personally reviewed my interpretation is normal sinus rhythm with occasional PVCs. Prior EKG showed similar rhythm   ASSESSMENT & PLAN:   Assessment & Plan by Problem: Principal Problem:   Acute on chronic HFrEF (heart failure with reduced ejection fraction) (HCC)   [name] is a 73 y.o. female with PMH of HFrEF (04/2024 EF: 20-25%), CKD 4, COPD on chronic oxygen  at home (2L [place]), HTN, T2DM, HLD, and PAF on Eliquis ,  who presents from PCP instruction following abnormal labs in the setting of shortness of breath and admitted for AKI on CKD 2/2 acute on chronic  heart failure.   ##Acute on Chronic Biventricular Heart Failure #BLE edema Last echo on 04/14/24 showed LVEF of 20-25% and mildly reduced right ventricular function. She is afebrile and mildly hypertensive (143/91), but vitals are otherwise stable. Patient appeared warm and wet on physical exam with mild lower extremity tender non-pitting edema mostly notable around the ankles. Lungs were clear to auscultation. Current presentation likely exacerbated by cessation of diuretic therapy. Will plan to closely monitor volume status and renal function. CRP on 8/4 was elevated at 22.7. She had a mild leukocytosis to 13.1, so we will pursue infectious and inflammatory work up as below prior to diuresis. There was question in outpatient setting regarding possible ankle septic arthritis, though based on physical exam this seems less likely but we will keep this in mind if other work up is negative. Potassium low after outpatient Lokelma , will plan to replenish this. Mg pending.  -BMP pending, monitoring renal function -Monitoring volume status - Hold spironolactone  - Fluid restriction - Strict I/Os - Daily weights  - keep K > 4, Mg > 2; given KCl 40 mEq for 2 doses on 8/5  - f/u Mg  - Consider inflammatory/infectious workup (ankle arthrocentesis) if no clinical improvement with diuresis  #AKI on CKD 4 Likely due to hypoperfusion in the setting of volume overload. Will continue to monitor electrolyte status and correct as appropriate. Some c/f progression to ESRD. Will hold nephrotoxic medications and continue to monitor renal function. She does have a slight leukocytosis to 13.1, so will plan for UA, renal US  and bladder scan, though she denies burning or pain with urination, pelvic pain, or difficulty urinating. She notes she has only been drinking about 1 cup of water a day, and her urine is dark yellow without hematuria.  - BMP pending - Magnesium  pending - UA pending - Bladder scan  - Renal ultrasound    #COPD Patient notes most of functional impairment is due to pain, but also reports increased dyspnea on exertion. Appeared stable on room air but requested O2 at home rate. - 2 L/min O2 via [place] - Continue home umeclidinium-vilanterol (Anoro Elipta) 62.5-25 MCG/ACT 1 puff   #T2DM Most recent A1C on 03/25/24 was 7.5. Patient measures glucose at home with Dexcom. Takes home Ozempic , Farxiga , and Tresiba  5 units daily. We will do SSI while inpatient.  - Continue home sliding scale insulin  - Hold home Farxiga  in the setting of worsening renal function   #HLD Most recent LDL 12/28/23 was 122. -Continue home atorvastatin  (Lipitor ) 80 mg -Follow up with PCP  #Paroxysmal Atrial Flutter Stable. NSR on EKG.  -continue home apixaban  (Eliquis ) 5 mg  #Multiple lung nodules Noted incidentally on CT 04/2024. -CT w/o contrast scheduled on 08/11   Best practice: Diet: Renal with fluid restriction VTE: DOAC Code: Full  Disposition planning: Prior to Admission Living Arrangement: Home, living alone, mostly independent Anticipated Discharge Location: Home Barriers to Discharge: Clinical improvement  Dispo: Admit patient to Observation with expected length of stay less than 2 midnights.  Signed: Cena Champion, [place] LABOR, Medical Student Internal Medicine Resident  05/07/2024, 3:31 PM  Please contact IM Residency On-Call Pager at: [phone] or  [phone].   Attestation for Student Documentation:  I personally was present and re-performed the history, physical exam and medical decision-making activities of this service and have verified that the service and findings are accurately documented in the student's note.  Doyal Miyamoto, MD 05/07/2024, 4:24 PM

## 2024-05-07 NOTE — Plan of Care (Signed)

## 2024-05-07 NOTE — Progress Notes (Signed)
 SCr 2.39 > >3.30, K 2.9.  AKI secondary to low perfusion from volume overload.  Notably, torsemide   and spironolactone  were held about 2 weeks ago due to hyperkalemia. Will directly admit patient to the hospital for IV diuresis.

## 2024-05-07 NOTE — Progress Notes (Signed)
 Transition of Care [facility]) - Inpatient Brief Assessment   Patient Details  Name: [name] MRN: [id number] Date of Birth: 01-May-1951  Transition of Care [facility]) CM/SW Contact:    [name], RN Phone Number: 05/07/2024, 4:15 PM   Clinical Narrative: Patient admitted to the hospital from home alone with Acute on chronic HFrEF.  Patient is on chronic oxygen  at home at 2L/min [place] at night through [employer].  DME equipment at the home includes Cane, toilet riser, tub bench, power WC and 3:1.  Patient is a current smoker at 1/2 PPD.  Patient is currently active with [facility] home health.  PT/OT evaluation pending at this time.  CM and MSW with IP Care management team will continue to follow the patient for needs as patient progresses.   Transition of Care Asessment: Insurance and Status: Insurance coverage has been reviewed Patient has primary care physician: Yes Home environment has been reviewed: from home alone Prior level of function:: RW, Power WC Prior/Current Home Services: Current home services (Active with [facility] home health) Social Drivers of Health Review: SDOH reviewed needs interventions Readmission risk has been reviewed: Yes Transition of care needs: no transition of care needs at this time

## 2024-05-07 NOTE — Hospital Course (Addendum)
 #AKI on CKD 4 #UTI Ms. Hustead presented to the ED over abnormal labs with concern for acute on chronic heart failure.  She was previously seen on 7/16 after an episode of presyncope preceded by a week of shortness of breath. She believed the episode was due to overheating. At that time, there was no evidence of MI, CHF exacerbation, COPD exacerbation or DVT/PE. CXR showed a cluster of micronodules with some concern for infection but was not worked up due to absence of fever, cough or leukocytosis. A f/u CT was scheduled for 8/11. Echo showed EF of 20-25%. She was found to have a prerenal AKI likely due to diuretics and decreased oral intake, which improved after discontinuation of torsemide . Shortness of breath and leg swelling had also improved prior to discharge. She did not resume the torsemide  following discharge.    She followed up with PCP on 7/21, and BMP showed hyperkalemia. She was advised to hold her potassium supplements and spironolactone  and restart her torsemide . However, on 7/23 a repeat BMP found persistent hyperkalemia and elevated Scr. She was advised to stop the torsemide  and was given 3 doses of Lokelma . She was supposed to follow up for repeat labs, though did not have this completed until she presented to [facility] clinic on 8/4.    She was seen again in [facility] on 8/4 with the complaint of worsening ankle swelling in the setting of stopping diuretic therapy. BMP showed hypokalemia and elevated Scr at 3.30 (BL 1.81). Cardiology saw her on the same day and noted HFrEF was stable, chronic LE edema has a component of venous stasis, and recommended restarting torsemide  MWF.    It was determined her AKI was likely a result of hypoperfusion from fluid overload. She was directly admitted to the hospital for IV diuresis. On presentation for this current hospitalization, she reported increased difficulty walking due to swelling and pain on bilateral lower extremities and worsening shortness of breath with  exertion. CRP on 8/4 was elevated at 22.7. She reported intermittent palpitations, chills, decreased appetite, thirst, fluid intake and decreased urine output with some urgency. She denied dysuria or hematuria. Labs were significant for leukocytosis and Scr elevation consistent with acute on chronic AKI.   Spironolactone , bisoprolol , Farxiga  and torsemide  were held. Physical exam showed some take bilateral non-pitting pedal edema, but was otherwise inconsistent with fluid overload. Bedside ultrasound was further reassuring against acute on chronic heart failure. A urinalysis was consistent with a UTI, and urine culture grew Klebsiella pneumoniae colonies. Renal US  was unremarkable. She was treated with a 5 day course of IV ceftriaxone . She remained afebrile and otherwise asymptomatic. Scr continued to improve well below her previously established baseline, lastly at 1.16 . Spironolactone  and bisoprolol  were added as tolerated given improved renal function. Leukocytosis improved some after antibiotic regimen, but has not resolved as of 08/11.  -Continue to monitor Scr and consider CKD reevaluation given improved GFR. She may be CKD3b.  -Repeat CBC to monitor for resolution of leukocytosis -Hold torsemide  40 mg, restart as outpatient pending adequate oral intake -Hold Farxiga  10 mg, restart as outpatient pending adequate oral intake and resolution of leukocytosis  #Chronic Biventricular Heart Failure #BLE edema Last echo on 04/14/24 showed LVEF of 20-25% and mildly reduced right ventricular function. She remained afebrile with some diastolic hypotension but otherwise hemodynamically stable and oxygenating well on room air. Patient appeared warm and wet on physical exam with mild lower extremity tender non-pitting edema mostly notable around the ankles. Lungs were clear  to auscultation. Bedside ultrasound demonstrated IVC collapsibility inconsistent with fluid overload.  Bisoprolol  and spironolactone  were  initially held due to concern for acute on chronic CHF and AKI, but reintroduced with improving Scr. - f/u outpatient with Cardiology  -Hold torsemide  40 mg, restart as outpatient pending adequate oral intake  #COPD Patient notes most of functional impairment is due to pain, but also reports increased dyspnea on exertion. Appeared stable on room air. She was continued on umeclidinium-vilanterol (Anoro Elipta) 62.5-25 MCG/ACT 1 puff    #T2DM Most recent A1C on 03/25/24 was 7.5. Patient measures glucose at home with Dexcom. Takes home Ozempic , Farxiga , and Tresiba  5 units daily. Last dose of Ozempic  was administered 8/3. She was initially maintained on SSI, but blood sugars were elevated. They were well controlled following addition of Glargine 5 units.  - Resume Tresiba  5 units daily  - Restart Ozempic  1 mg weekly - Hold Farxiga  10 mg, restart as outpatient pending adequate oral intake and resolution of leukocytosis   #HLD Most recent LDL 12/28/23 was 122. She was continued on home Lipitor . -Follow up LDL with PCP.   #Paroxysmal Atrial Flutter She remained NSR on EKG and telemetry. She was continued on home apixaban  (Eliquis ) 5 mg.   #Multiple lung nodules Noted incidentally on CT 04/2024. CT will need to be rescheduled.

## 2024-05-08 ENCOUNTER — Telehealth

## 2024-05-08 DIAGNOSIS — I480 Paroxysmal atrial fibrillation: Secondary | ICD-10-CM | POA: Diagnosis present

## 2024-05-08 DIAGNOSIS — I130 Hypertensive heart and chronic kidney disease with heart failure and stage 1 through stage 4 chronic kidney disease, or unspecified chronic kidney disease: Secondary | ICD-10-CM | POA: Diagnosis present

## 2024-05-08 DIAGNOSIS — Z1611 Resistance to penicillins: Secondary | ICD-10-CM | POA: Diagnosis not present

## 2024-05-08 DIAGNOSIS — I5043 Acute on chronic combined systolic (congestive) and diastolic (congestive) heart failure: Secondary | ICD-10-CM | POA: Diagnosis present

## 2024-05-08 DIAGNOSIS — E1122 Type 2 diabetes mellitus with diabetic chronic kidney disease: Secondary | ICD-10-CM | POA: Diagnosis not present

## 2024-05-08 DIAGNOSIS — N179 Acute kidney failure, unspecified: Secondary | ICD-10-CM | POA: Diagnosis present

## 2024-05-08 DIAGNOSIS — I5082 Biventricular heart failure: Secondary | ICD-10-CM | POA: Diagnosis not present

## 2024-05-08 DIAGNOSIS — J449 Chronic obstructive pulmonary disease, unspecified: Secondary | ICD-10-CM | POA: Diagnosis not present

## 2024-05-08 DIAGNOSIS — N390 Urinary tract infection, site not specified: Secondary | ICD-10-CM | POA: Diagnosis not present

## 2024-05-08 DIAGNOSIS — Z794 Long term (current) use of insulin: Secondary | ICD-10-CM | POA: Diagnosis not present

## 2024-05-08 DIAGNOSIS — E1165 Type 2 diabetes mellitus with hyperglycemia: Secondary | ICD-10-CM | POA: Diagnosis present

## 2024-05-08 DIAGNOSIS — E669 Obesity, unspecified: Secondary | ICD-10-CM | POA: Diagnosis present

## 2024-05-08 DIAGNOSIS — E1129 Type 2 diabetes mellitus with other diabetic kidney complication: Secondary | ICD-10-CM | POA: Diagnosis not present

## 2024-05-08 DIAGNOSIS — Z9981 Dependence on supplemental oxygen: Secondary | ICD-10-CM | POA: Diagnosis not present

## 2024-05-08 DIAGNOSIS — D72829 Elevated white blood cell count, unspecified: Secondary | ICD-10-CM

## 2024-05-08 DIAGNOSIS — I071 Rheumatic tricuspid insufficiency: Secondary | ICD-10-CM | POA: Diagnosis present

## 2024-05-08 DIAGNOSIS — E876 Hypokalemia: Secondary | ICD-10-CM | POA: Diagnosis present

## 2024-05-08 DIAGNOSIS — K760 Fatty (change of) liver, not elsewhere classified: Secondary | ICD-10-CM | POA: Diagnosis present

## 2024-05-08 DIAGNOSIS — M6281 Muscle weakness (generalized): Secondary | ICD-10-CM | POA: Diagnosis not present

## 2024-05-08 DIAGNOSIS — E1143 Type 2 diabetes mellitus with diabetic autonomic (poly)neuropathy: Secondary | ICD-10-CM | POA: Diagnosis present

## 2024-05-08 DIAGNOSIS — D75839 Thrombocytosis, unspecified: Secondary | ICD-10-CM | POA: Diagnosis present

## 2024-05-08 DIAGNOSIS — Z743 Need for continuous supervision: Secondary | ICD-10-CM | POA: Diagnosis not present

## 2024-05-08 DIAGNOSIS — Z7901 Long term (current) use of anticoagulants: Secondary | ICD-10-CM | POA: Diagnosis not present

## 2024-05-08 DIAGNOSIS — J9610 Chronic respiratory failure, unspecified whether with hypoxia or hypercapnia: Secondary | ICD-10-CM | POA: Diagnosis present

## 2024-05-08 DIAGNOSIS — E785 Hyperlipidemia, unspecified: Secondary | ICD-10-CM | POA: Diagnosis not present

## 2024-05-08 DIAGNOSIS — Z95810 Presence of automatic (implantable) cardiac defibrillator: Secondary | ICD-10-CM | POA: Diagnosis not present

## 2024-05-08 DIAGNOSIS — N3001 Acute cystitis with hematuria: Secondary | ICD-10-CM | POA: Diagnosis present

## 2024-05-08 DIAGNOSIS — I428 Other cardiomyopathies: Secondary | ICD-10-CM | POA: Diagnosis present

## 2024-05-08 DIAGNOSIS — Z7984 Long term (current) use of oral hypoglycemic drugs: Secondary | ICD-10-CM | POA: Diagnosis not present

## 2024-05-08 DIAGNOSIS — N184 Chronic kidney disease, stage 4 (severe): Secondary | ICD-10-CM | POA: Diagnosis not present

## 2024-05-08 DIAGNOSIS — R5383 Other fatigue: Secondary | ICD-10-CM | POA: Diagnosis not present

## 2024-05-08 DIAGNOSIS — R2681 Unsteadiness on feet: Secondary | ICD-10-CM | POA: Diagnosis not present

## 2024-05-08 DIAGNOSIS — I4892 Unspecified atrial flutter: Secondary | ICD-10-CM | POA: Diagnosis not present

## 2024-05-08 DIAGNOSIS — Z7985 Long-term (current) use of injectable non-insulin antidiabetic drugs: Secondary | ICD-10-CM | POA: Diagnosis not present

## 2024-05-08 DIAGNOSIS — B961 Klebsiella pneumoniae [K. pneumoniae] as the cause of diseases classified elsewhere: Secondary | ICD-10-CM | POA: Diagnosis present

## 2024-05-08 LAB — URINALYSIS, W/ REFLEX TO CULTURE (INFECTION SUSPECTED)
Bilirubin Urine: NEGATIVE
Glucose, UA: >=500 mg/dL — AB
Ketones, ur: NEGATIVE mg/dL
Nitrite: NEGATIVE
Protein, ur: NEGATIVE mg/dL
Specific Gravity, Urine: 1.017 (ref 1.005–1.030)
WBC, UA: >50 WBC/hpf (ref 0–5)
pH: 5 (ref 5.0–8.0)

## 2024-05-08 LAB — CBC
HCT: 38 % (ref 36.0–46.0)
Hemoglobin: 11.9 g/dL — ABNORMAL LOW (ref 12.0–15.0)
MCH: 25.2 pg — ABNORMAL LOW (ref 26.0–34.0)
MCHC: 31.3 g/dL (ref 30.0–36.0)
MCV: 80.5 fL (ref 80.0–100.0)
Platelets: 431 K/uL — ABNORMAL HIGH (ref 150–400)
RBC: 4.72 MIL/uL (ref 3.87–5.11)
RDW: 15.5 % (ref 11.5–15.5)
WBC: 12.7 K/uL — ABNORMAL HIGH (ref 4.0–10.5)
nRBC: 0 % (ref 0.0–0.2)

## 2024-05-08 LAB — MAGNESIUM: Magnesium: 1.8 mg/dL (ref 1.7–2.4)

## 2024-05-08 LAB — BASIC METABOLIC PANEL WITH GFR
Anion gap: 14 (ref 5–15)
BUN: 50 mg/dL — ABNORMAL HIGH (ref 8–23)
CO2: 26 mmol/L (ref 22–32)
Calcium: 9.1 mg/dL (ref 8.9–10.3)
Chloride: 100 mmol/L (ref 98–111)
Creatinine, Ser: 2.49 mg/dL — ABNORMAL HIGH (ref 0.44–1.00)
GFR, Estimated: 20 mL/min — ABNORMAL LOW (ref >60)
Glucose, Bld: 174 mg/dL — ABNORMAL HIGH (ref 70–99)
Potassium: 3.1 mmol/L — ABNORMAL LOW (ref 3.5–5.1)
Sodium: 140 mmol/L (ref 135–145)

## 2024-05-08 LAB — GLUCOSE, CAPILLARY
Glucose-Capillary: 137 mg/dL — ABNORMAL HIGH (ref 70–99)
Glucose-Capillary: 250 mg/dL — ABNORMAL HIGH (ref 70–99)
Glucose-Capillary: 261 mg/dL — ABNORMAL HIGH (ref 70–99)

## 2024-05-08 MED ORDER — ONDANSETRON HCL 4 MG PO TABS: 4.0000 mg | ORAL_TABLET | Freq: Once | ORAL | Status: DC

## 2024-05-08 MED ORDER — SODIUM CHLORIDE 0.9 % IV SOLN
1.0000 g | INTRAVENOUS | Status: DC
  Administered 2024-05-08 – 2024-05-10 (×3): 1 g via INTRAVENOUS
  Filled 2024-05-08 (×3): qty 10

## 2024-05-08 MED ORDER — POTASSIUM CHLORIDE 20 MEQ PO PACK
40.0000 meq | PACK | ORAL | Status: AC
  Administered 2024-05-08 (×2): 40 meq via ORAL
  Filled 2024-05-08 (×2): qty 2

## 2024-05-08 NOTE — NC FL2 (Signed)
 [place]  MEDICAID FL2 LEVEL OF CARE FORM     IDENTIFICATION  Patient Name: [name] Birthdate: 07-Jan-1951 Sex: female Admission Date (Current Location): 05/07/2024  [facility] and [place] Number:  Producer, television/film/video and Address:  [facility], [address] [place], [place] [address]      Provider Number: [id number]  Attending Physician Name and Address:  [name], MD  Relative Name and Phone Number:  Secundino Anes (Daughter)  [phone]    Current Level of Care: Hospital Recommended Level of Care: Skilled Nursing Facility Prior Approval Number:    Date Approved/Denied:   PASRR Number: [id number]  Discharge Plan: SNF    Current Diagnoses: Patient Active Problem List   Diagnosis Date Noted   Leukocytosis 05/08/2024   Acute on chronic HFrEF (heart failure with reduced ejection fraction) (HCC) 05/07/2024   Pain and swelling of ankle 05/06/2024   Multiple lung nodules 04/23/2024   QT prolongation 04/15/2024   Acute on chronic hypoxic respiratory failure (HCC) 04/13/2024   Right ankle swelling 03/26/2024   Small vessel disease, cerebrovascular 12/28/2023   Esophageal dysphagia 10/10/2022   Preventative health care 08/18/2022   CKD (chronic kidney disease) stage 4, GFR 15-29 ml/min (HCC) 07/26/2022   COPD (chronic obstructive pulmonary disease) (HCC) 07/26/2022   Obesity (BMI 30-39.9) 07/26/2022   Paroxysmal atrial flutter (HCC) 07/25/2022   Multiple thyroid  nodules 07/04/2022   Recurrent falls 07/04/2022   Hepatic steatosis 07/28/2020   Hyperlipidemia    Osteoporosis 02/12/2020   Intrinsic atopic dermatitis 09/19/2019   Hyperkalemia 08/08/2017   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   Acute on chronic systolic heart failure (HCC) 03/18/2016   AKI (acute kidney injury) (HCC)    Spinal stenosis of lumbar region 12/09/2015   Restrictive lung disease 06/24/2015   Diabetic gastroparesis associated with type 2 diabetes  mellitus (HCC) 07/12/2014   Depression 05/22/2014   DM (diabetes mellitus) type II controlled with renal manifestation (HCC) 04/03/2012   Tobacco use disorder 10/26/2009   Essential hypertension 05/25/2007   Chronic combined systolic and diastolic heart failure (HCC) 05/25/2007   ICD (implantable cardioverter-defibrillator) in place 05/04/2007    Orientation RESPIRATION BLADDER Height & Weight     Self, Time, Situation, Place  O2 (2L, chronic at baseline) Continent Weight: 172 lb 2.9 oz (78.1 kg) Height:  5' 2 (157.5 cm)  BEHAVIORAL SYMPTOMS/MOOD NEUROLOGICAL BOWEL NUTRITION STATUS      Continent Diet (see DC summary)  AMBULATORY STATUS COMMUNICATION OF NEEDS Skin   Extensive Assist Verbally Normal                       Personal Care Assistance Level of Assistance  Bathing, Feeding, Dressing Bathing Assistance: Maximum assistance Feeding assistance: Limited assistance Dressing Assistance: Maximum assistance     Functional Limitations Info  Sight, Hearing, Speech Sight Info: Impaired Hearing Info: Adequate Speech Info: Adequate    SPECIAL CARE FACTORS FREQUENCY  OT (By licensed OT), PT (By licensed PT)     PT Frequency: 5x/week OT Frequency: 5x/week            Contractures Contractures Info: Not present    Additional Factors Info  Code Status, Allergies Code Status Info: FULL Allergies Info: Actos (Pioglitazone)  Naproxen   Rosiglitazone  Metformin  And Related  Tomato  Hydrocodone -acetaminophen   Hydrocortisone           Current Medications (05/08/2024):  This is the current hospital active medication list Current Facility-Administered Medications  Medication  Dose Route Frequency Provider Last Rate Last Admin   acetaminophen  (TYLENOL ) tablet 650 mg  650 mg Oral Q6H PRN Zheng, Michael, DO   650 mg at 05/08/24 9158   Or   acetaminophen  (TYLENOL ) suppository 650 mg  650 mg Rectal Q6H PRN Zheng, Michael, DO       apixaban  (ELIQUIS ) tablet 5 mg  5 mg Oral BID  Zheng, Michael, DO   5 mg at 05/08/24 9160   atorvastatin  (LIPITOR ) tablet 80 mg  80 mg Oral QHS Zheng, Michael, DO   80 mg at 05/07/24 2013   cefTRIAXone  (ROCEPHIN ) 1 g in sodium chloride  0.9 % 100 mL IVPB  1 g Intravenous Q24H Zheng, Michael, DO 200 mL/hr at 05/08/24 1430 1 g at 05/08/24 1430   insulin  aspart (novoLOG ) injection 0-15 Units  0-15 Units Subcutaneous TID WC Zheng, Michael, DO   5 Units at 05/08/24 1128   ondansetron  (ZOFRAN ) tablet 4 mg  4 mg Oral Once Amilibia, Jaden, DO       senna-docusate (Senokot-S) tablet 1 tablet  1 tablet Oral QHS PRN Zheng, Michael, DO       umeclidinium-vilanterol (ANORO ELLIPTA ) 62.5-25 MCG/ACT 1 puff  1 puff Inhalation Daily Elicia Sharper, DO   1 puff at 05/08/24 0907     Discharge Medications: Please see discharge summary for a list of discharge medications.  Relevant Imaging Results:  Relevant Lab Results:   Additional Information [id number]  Peggy Yilmaz A [place], LCSW

## 2024-05-08 NOTE — Progress Notes (Addendum)
 HD#0 SUBJECTIVE:  Patient Summary: [name] is a 73 y.o. female with PMH of HFrEF (04/2024 EF: 20-25%), CKD 4, COPD, HTN, T2DM, HLD, PAF on Eliquis , and R ankle fx s/p ORIF who presents following abnormal labs and shortness of breath and admitted for AKI on CKD in the setting of acute on chronic heart failure.   Overnight Events: 0048: K up to 3.1; repleted   Interim History: Patient reports she feels somewhat better than yesterday. She has been trying to walk but couldn't today. She has been too sore to move. States she got the TED hose, is tolerating them well and hopes they will help. She says her breakfast was light and threw up shortly after eating, but this was the same time as they were trying to move her. She also is unsure if she ate too quickly or if it was the milk she had since she does not drink it often. She denies dizziness or the room spinning. No blood in vomiting. No longer feeling nauseous. Otherwise feels better than yesterday. Denies abdominal pain, any globus sensation, or chest pain. Denies previous GERD diagnosis. States she has been having chills after she threw up and has not had this before. Last BM was last night and soft and normal color with no blood. Her urination feels fine, but still feels some urgency.  OBJECTIVE:  Vital Signs: Vitals:   05/08/24 0027 05/08/24 0416 05/08/24 0500 05/08/24 0810  BP: 124/74 (!) 126/58  (!) 115/52  Pulse: 86 83  82  Resp: 18 18    Temp: 98.1 F (36.7 C) 98 F (36.7 C)  97.6 F (36.4 C)  TempSrc:      SpO2: 97% 92%  95%  Weight:   78.1 kg   Height:       Supplemental O2: Room Air SpO2: 95 %  Filed Weights   05/07/24 1315 05/08/24 0500  Weight: 78 kg 78.1 kg     Intake/Output Summary (Last 24 hours) at 05/08/2024 1045 Last data filed at 05/08/2024 0300 Gross per 24 hour  Intake 410.58 ml  Output --  Net 410.58 ml   Net IO Since Admission: 410.58 mL [05/08/24 1045]  Physical Exam: Physical Exam Constitutional:       Appearance: Normal appearance.  Cardiovascular:     Rate and Rhythm: Normal rate and regular rhythm.     Pulses: Normal pulses.     Heart sounds: Normal heart sounds. No murmur heard. Pulmonary:     Effort: Pulmonary effort is normal.     Breath sounds: Normal breath sounds.  Abdominal:     General: Abdomen is flat. Bowel sounds are normal.     Palpations: Abdomen is soft.     Tenderness: There is no abdominal tenderness.  Musculoskeletal:     Comments: She is wearing compression stockings bilaterally.   Skin:    General: Skin is warm and dry.  Neurological:     Mental Status: She is alert.     Patient Lines/Drains/Airways Status     Active Line/Drains/Airways     Name Placement date Placement time Site Days   Peripheral IV 05/07/24 Right Antecubital 05/07/24  --  Antecubital  1            Pertinent labs and imaging:      Latest Ref Rng & Units 05/08/2024   12:48 AM 05/07/2024    2:36 PM 04/15/2024    4:13 AM  CBC  WBC 4.0 - 10.5 K/uL 12.7  13.1  8.7   Hemoglobin 12.0 - 15.0 g/dL 88.0  87.8  88.7   Hematocrit 36.0 - 46.0 % 38.0  38.9  35.6   Platelets 150 - 400 K/uL 431  246  335        Latest Ref Rng & Units 05/08/2024   12:48 AM 05/07/2024    2:36 PM 05/06/2024    2:55 PM  CMP  Glucose 70 - 99 mg/dL 825  737  766   BUN 8 - 23 mg/dL 50  50  49   Creatinine 0.44 - 1.00 mg/dL 7.50  7.06  6.69   Sodium 135 - 145 mmol/L 140  137  136   Potassium 3.5 - 5.1 mmol/L 3.1  2.8  2.9   Chloride 98 - 111 mmol/L 100  96  94   CO2 22 - 32 mmol/L 26  25  24    Calcium  8.9 - 10.3 mg/dL 9.1  9.1  8.5    Magnesium  1.8   Bladder Scan: 101 mL  US  RENAL Result Date: 05/07/2024 CLINICAL DATA:  [id number] AKI (acute kidney injury) (HCC) [id number] EXAM: RENAL / URINARY TRACT ULTRASOUND COMPLETE COMPARISON:  Ultrasound renal 07/31/2022 FINDINGS: Right Kidney: Renal measurements: 9.3 x 3.9 x 3.7 cm = volume: 70 mL. Echogenicity within normal limits. No mass or hydronephrosis visualized.  Left Kidney: Renal measurements: 9 x 4.7 x 3.2 cm = volume: 72 mL. Echogenicity within normal limits. No mass or hydronephrosis visualized. Urinary bladder: Appears normal for degree of bladder distention. Other: None. IMPRESSION: Unremarkable renal ultrasound. Electronically Signed   By: Morgane  Naveau M.D.   On: 05/07/2024 22:37   DG CHEST PORT 1 VIEW Result Date: 05/07/2024 CLINICAL DATA:  Shortness of breath. EXAM: PORTABLE CHEST 1 VIEW COMPARISON:  Chest radiograph dated 04/13/2024 FINDINGS: No focal consolidation, pleural effusion or pneumothorax. Top-normal cardiac silhouette. No acute bony AICD device. No acute osseous pathology. IMPRESSION: No active disease. Electronically Signed   By: Vanetta Chou M.D.   On: 05/07/2024 20:01    ASSESSMENT/PLAN:  Assessment: Principal Problem:   Acute on chronic HFrEF (heart failure with reduced ejection fraction) (HCC)  [name] is a 73 y.o. female with PMH of HFrEF (04/2024 EF: 20-25%), CKD 4, COPD on chronic oxygen  at home (2L [place]), HTN, T2DM, HLD, and PAF on Eliquis ,  who presents from PCP instruction following abnormal labs in the setting of shortness of breath and admitted for AKI on CKD 2/2 acute on chronic heart failure.   Plan: ##Acute on Chronic Biventricular Heart Failure #BLE edema Last echo on 04/14/24 showed LVEF of 20-25% and mildly reduced right ventricular function. She has continued to have diastolic hypotension and mildly elevated pulse pressure, but otherwise hemodynamically stable, afebrile and oxygenating well on room air. Lungs were clear to auscultation. Bedside ultrasound demonstrated IVC collapsibility inconsistent with fluid overload.   She notes BLE edema has improved, but unable to assess due to compression stockings. There was question in outpatient setting regarding possible ankle septic arthritis, though less likely to present bilaterally and physical exam was negative for local warmth or erythema, but we will keep  this in mind if other work up is negative.  CRP on 8/4 was elevated at 22.7. Leukocytosis has mildly improved 13.1>12.7. Thrombocytosis developed overnight. Some concern for acute inflammatory or infectious process. She has remained afebrile and denied any dysuria, but endorsed oliguria. Awaiting UA and culture. Will closely monitor volume status and renal function.  -UA pending -Scr improving, BMP  otherwise stable, monitoring renal function -Monitoring volume status -Compression stockings -Hold spironolactone  -Strict I/Os -Daily weights  -AM CBC and BMP  #AKI on CKD 4 Likely due to hypoperfusion, volume overload unlikely. Scr has been improving 3.30>2.93>2.49 (BL ~2.10). Renal US  was unremarkable. Bladder scan showed 101 mL, reassuring against retention. Potassium and magnesium  have been low but responsive to electrolyte replenishment. Will continue to monitor electrolyte status and correct as appropriate. Will hold nephrotoxic medications and continue to monitor renal function. She does have a slight leukocytosis to 13.1, so will plan for UA, though she denies burning or pain with urination, pelvic pain, or difficulty urinating. She notes she has only been drinking about 1 cup of water a day, and her urine is dark yellow without hematuria.  -Scr improving, BMP otherwise stable, monitoring renal function. Potassium low after outpatient Lokelma , but has been responding to replenishment. Mg was mildly low and was replenished, will continue to monitor. -Keep K > 4, Mg > 2 -KCl 80 mEq once -UA pending  #New onset emesis She had an episode of emesis today while trying to ambulate shortly after meal. No associated symptoms. Described as food contents, with no hematemesis. She denies ongoing nausea, abdominal tenderness. EKG showed normal Qt. -Ondansetron  (Zofran ) 4 mg once.  #COPD Patient notes most of functional impairment is due to pain, but also reports increased dyspnea on exertion. Appeared  stable on room air but requested O2 at home rate.  - 2 L/min O2 via [place] - Continue home umeclidinium-vilanterol (Anoro Elipta) 62.5-25 MCG/ACT 1 puff    #T2DM Most recent A1C on 03/25/24 was 7.5. Patient measures glucose at home with Dexcom. Takes home Ozempic , Farxiga , and Tresiba  5 units daily. We will do SSI while inpatient.  - Continue home sliding scale insulin  - Hold home Farxiga  in the setting of worsening renal function    #HLD Most recent LDL 12/28/23 was 122. -Continue home atorvastatin  (Lipitor ) 80 mg -Follow up with PCP   #Paroxysmal Atrial Flutter Stable. NSR on EKG.  -continue home apixaban  (Eliquis ) 5 mg   #Multiple lung nodules Noted incidentally on CT 04/2024. -CT w/o contrast scheduled on 08/11    Best Practice: Diet: Renal diet VTE: Place TED hose Start: 05/08/24 0754Home DOAC Code: Full  Disposition planning: Therapy Recs: Pending, DME: wheelchair Family Contact: Secundino Anes (Daughter) [phone] (Mobile) , called and notified. DISPO: Anticipated discharge today or tomorrow to Home pending urine culture.  Signature:  Nunzio DELENA Bolt [name]  10:45 AM, 05/08/2024  On Call pager [phone]     Attestation for Student Documentation:  I personally was present and re-performed the history, physical exam and medical decision-making activities of this service and have verified that the service and findings are accurately documented in the student's note.  Terren Jandreau, DO 05/08/2024, 1:57 PM

## 2024-05-08 NOTE — Evaluation (Signed)
 Physical Therapy Evaluation Patient Details Name: [name] MRN: [id number] DOB: 1951/09/25 Today's Date: 05/08/2024  History of Present Illness  Pt is a 73 y/o female presenting to the ED with shortness of breath and admitted for AKI on CKD in the setting of acute on chronic heart failure on 05/07/24. PMH significant for COPD Gold stage II with chronic hypoxic respite failure on nocturnal nasal cannula, chronic combined HFpEF and HFrEF, LVEF 20-25%, status post AICD, IIDM, cigarette smoker, HTN, HLD, lipoma, depression.  Clinical Impression  Pt received sitting on edge of bed and experienced sudden onset of nausea and vomiting. RN present and aware. Pt requested to return to supine. Required min-mod assist for lateral scoot up towards edge of bed and to return to supine. SpO2 96% on RA, HR up to 106. Pt previously worked with OT and was unable to stand up from edge of bed; prior admission last month pt was ambulating 200'. Will continue to follow acutely to progress as tolerated. Currently, patient will benefit from continued inpatient follow up therapy, <3 hours/day         If plan is discharge home, recommend the following: Two people to help with walking and/or transfers;A lot of help with bathing/dressing/bathroom   Can travel by private vehicle   No    Equipment Recommendations None recommended by PT  Recommendations for Other Services       Functional Status Assessment Patient has had a recent decline in their functional status and demonstrates the ability to make significant improvements in function in a reasonable and predictable amount of time.     Precautions / Restrictions Precautions Precautions: Fall Recall of Precautions/Restrictions: Intact Precaution/Restrictions Comments: monitor O2 (only wears at night) Restrictions Weight Bearing Restrictions Per Provider Order: No      Mobility  Bed Mobility Overal bed mobility: Needs Assistance Bed Mobility: Sit to  Supine     Supine to sit: Min assist     General bed mobility comments: Pt picking up LLE with hands to elevate onto bed, minA for RLE elevation    Transfers Overall transfer level: Needs assistance Equipment used: None               General transfer comment: Lateral scoot towards right with modA, pt able to clear bottom/hips from edge of bed    Ambulation/Gait                  Stairs            Wheelchair Mobility     Tilt Bed    Modified Rankin (Stroke Patients Only)       Balance Overall balance assessment: Needs assistance Sitting-balance support: Bilateral upper extremity supported, Feet supported Sitting balance-Leahy Scale: Fair Sitting balance - Comments: did not challenge                                     Pertinent Vitals/Pain Pain Assessment Pain Assessment: Faces Faces Pain Scale: Hurts little more Pain Location: distal BLE's near ankle Pain Descriptors / Indicators: Grimacing, Guarding, Sharp, Tender Pain Intervention(s): Limited activity within patient's tolerance, Monitored during session    Home Living Family/patient expects to be discharged to:: Private residence Living Arrangements: Alone Available Help at Discharge: Family;Available PRN/intermittently;Neighbor Type of Home: Apartment Home Access: Stairs to enter Entrance Stairs-Rails: Right Entrance Stairs-Number of Steps: 3   Home Layout: One level Home Equipment: Cane -  single point;Toilet riser;Tub bench;Wheelchair - power;Standard Walker;BSC/3in1      Prior Function Prior Level of Function : Independent/Modified Independent             Mobility Comments: using a rollator ADLs Comments: Has needed increased assist for ADLs, has been able to manage IADLs     Extremity/Trunk Assessment   Upper Extremity Assessment Upper Extremity Assessment: Generalized weakness    Lower Extremity Assessment Lower Extremity Assessment: Generalized  weakness;LLE deficits/detail;RLE deficits/detail RLE Deficits / Details: Increased edema LLE Deficits / Details: Increased edema    Cervical / Trunk Assessment Cervical / Trunk Assessment: Normal  Communication   Communication Communication: No apparent difficulties    Cognition Arousal: Alert Behavior During Therapy: WFL for tasks assessed/performed   PT - Cognitive impairments: No apparent impairments                         Following commands: Intact       Cueing Cueing Techniques: Verbal cues     General Comments General comments (skin integrity, edema, etc.): VSS on RA    Exercises     Assessment/Plan    PT Assessment Patient needs continued PT services  PT Problem List Decreased strength;Decreased balance;Decreased activity tolerance;Decreased mobility;Pain       PT Treatment Interventions Gait training;DME instruction;Functional mobility training;Therapeutic activities;Therapeutic exercise;Balance training;Patient/family education    PT Goals (Current goals can be found in the Care Plan section)  Acute Rehab PT Goals Patient Stated Goal: did not state PT Goal Formulation: With patient Time For Goal Achievement: 05/22/24 Potential to Achieve Goals: Fair    Frequency Min 3X/week     Co-evaluation               AM-PAC PT 6 Clicks Mobility  Outcome Measure Help needed turning from your back to your side while in a flat bed without using bedrails?: A Little Help needed moving from lying on your back to sitting on the side of a flat bed without using bedrails?: A Little Help needed moving to and from a bed to a chair (including a wheelchair)?: A Lot Help needed standing up from a chair using your arms (e.g., wheelchair or bedside chair)?: A Lot Help needed to walk in hospital room?: Total Help needed climbing 3-5 steps with a railing? : Total 6 Click Score: 12    End of Session   Activity Tolerance: Other (comment) (limited by  vomiting) Patient left: in bed;with call bell/phone within reach;with bed alarm set Nurse Communication: Mobility status;Other (comment) (vomiting) PT Visit Diagnosis: Muscle weakness (generalized) (M62.81);Difficulty in walking, not elsewhere classified (R26.2)    Time: 9079-9057 PT Time Calculation (min) (ACUTE ONLY): 22 min   Charges:   PT Evaluation $PT Eval Low Complexity: 1 Low   PT General Charges $$ ACUTE PT VISIT: 1 Visit         Aleck [name], PT, DPT Acute Rehabilitation Services Office [phone]   [name] 05/08/2024, 11:34 AM

## 2024-05-08 NOTE — Plan of Care (Signed)

## 2024-05-08 NOTE — Evaluation (Signed)
 Occupational Therapy Evaluation Patient Details Name: [name] MRN: [id number] DOB: [birth year] Today's Date: 05/08/2024   History of Present Illness   Pt is a 73 y/o female presenting to the ED with shortness of breath and admitted for AKI on CKD in the setting of acute on chronic heart failure on 05/07/24. PMH significant for COPD Gold stage II with chronic hypoxic respite failure on nocturnal nasal cannula, chronic combined HFpEF and HFrEF, LVEF 20-25%, status post AICD, IIDM, cigarette smoker, HTN, HLD, lipoma, depression.     Clinical Impressions Prior to this admission, patient living alone, but family checking on her and assisting with ADLs. Patient reports she's been having a harder time moving recently. Currently, patient is presenting with generalized weakness and decreased activity tolerance. Patient supervision to transition to EOB, but unable to come into standing despite 3 trials and from an elevated surface with mod-max A. Patient able to complete bathing ADL with set up sitting EOB. Given current level and increased need for assist, OT is recommending therapy services < 3 hours prior to discharge home. If patient is able to improve in the hospital, there is a potential of being able to return home with [facility] services. OT will continue to follow acutely.      If plan is discharge home, recommend the following:   Two people to help with walking and/or transfers;A lot of help with bathing/dressing/bathroom;Assist for transportation;Help with stairs or ramp for entrance     Functional Status Assessment   Patient has had a recent decline in their functional status and demonstrates the ability to make significant improvements in function in a reasonable and predictable amount of time.     Equipment Recommendations   Other (comment) (defer to next venue)     Recommendations for Other Services         Precautions/Restrictions   Precautions Precautions: Fall Recall of  Precautions/Restrictions: Intact Precaution/Restrictions Comments: monitor O2 (only wears at night) Restrictions Weight Bearing Restrictions Per Provider Order: No     Mobility Bed Mobility Overal bed mobility: Needs Assistance Bed Mobility: Supine to Sit     Supine to sit: Supervision     General bed mobility comments: Supervision but needing extra time to complete    Transfers Overall transfer level: Needs assistance Equipment used: Rolling walker (2 wheels) Transfers: Sit to/from Stand Sit to Stand: Mod assist, +2 physical assistance, +2 safety/equipment           General transfer comment: Attempting with OT 3x, unable to clear hips from lowered surface, able to clear hips x2 with increased assist (mod-max A) from elevated surface, unable to come into standing      Balance Overall balance assessment: Needs assistance Sitting-balance support: Bilateral upper extremity supported, Feet supported Sitting balance-Leahy Scale: Fair Sitting balance - Comments: did not challenge   Standing balance support: Reliant on assistive device for balance, During functional activity, Bilateral upper extremity supported Standing balance-Leahy Scale: Zero Standing balance comment: unable to come into standing                           ADL either performed or assessed with clinical judgement   ADL Overall ADL's : Needs assistance/impaired Eating/Feeding: Independent   Grooming: Wash/dry hands;Wash/dry face;Set up;Sitting   Upper Body Bathing: Set up;Sitting   Lower Body Bathing: Moderate assistance;Sit to/from stand;Sitting/lateral leans Lower Body Bathing Details (indicate cue type and reason): to complete in standing Upper Body Dressing : Set  up;Sitting   Lower Body Dressing: Moderate assistance;Sitting/lateral leans;Sit to/from stand Lower Body Dressing Details (indicate cue type and reason): to don socks Toilet Transfer: Moderate assistance;+2 for physical  assistance;+2 for safety/equipment;BSC/3in1;Rolling walker (2 wheels) Toilet Transfer Details (indicate cue type and reason): unable to come into standing this date Toileting- Clothing Manipulation and Hygiene: Moderate assistance;Sit to/from stand;+2 for physical assistance;+2 for safety/equipment       Functional mobility during ADLs: Moderate assistance;+2 for physical assistance;+2 for safety/equipment;Cueing for safety;Cueing for sequencing;Rolling walker (2 wheels) General ADL Comments: Prior to this admission, patient living alone, but family checking on her and assisting with ADLs. Patient reports she's been having a harder time moving recently. Currently, patient is presenting with generalized weakness and decreased activity tolerance. Patient supervision to transition to EOB, but unable to come into standing despite 3 trials and from an elevated surface with mod-max A. Patient able to complete bathing ADL with set up sitting EOB. Given current level and increased need for assist, OT is recommending therapy services < 3 hours prior to discharge home. If patient is able to improve in the hospital, there is a potential of being able to return home with [facility] services. OT will continue to follow acutely.     Vision Baseline Vision/History: 1 Wears glasses Ability to See in Adequate Light: 0 Adequate Patient Visual Report: No change from baseline Vision Assessment?: No apparent visual deficits     Perception Perception: Not tested       Praxis Praxis: Not tested       Pertinent Vitals/Pain Pain Assessment Pain Assessment: Faces Faces Pain Scale: Hurts a little bit Pain Location: B feet Pain Descriptors / Indicators: Grimacing, Guarding Pain Intervention(s): Limited activity within patient's tolerance, Monitored during session, Repositioned     Extremity/Trunk Assessment Upper Extremity Assessment Upper Extremity Assessment: Generalized weakness;Right hand dominant   Lower  Extremity Assessment Lower Extremity Assessment: Defer to PT evaluation   Cervical / Trunk Assessment Cervical / Trunk Assessment: Normal   Communication Communication Communication: No apparent difficulties   Cognition Arousal: Alert Behavior During Therapy: WFL for tasks assessed/performed Cognition: No apparent impairments                               Following commands: Intact       Cueing  General Comments   Cueing Techniques: Verbal cues  VSS on RA   Exercises     Shoulder Instructions      Home Living Family/patient expects to be discharged to:: Private residence Living Arrangements: Alone Available Help at Discharge: Family;Available PRN/intermittently;Neighbor Type of Home: Apartment Home Access: Stairs to enter [employer] of Steps: 3 Entrance Stairs-Rails: Right Home Layout: One level     Bathroom Shower/Tub: Chief Strategy Officer: Standard     Home Equipment: Cane - single point;Toilet riser;Tub bench;Wheelchair - power;Standard Walker;BSC/3in1          Prior Functioning/Environment Prior Level of Function : Independent/Modified Independent             Mobility Comments: using a rollator ADLs Comments: Has needed increased assist for ADLs, has been able to manage IADLs    OT Problem List: Decreased strength;Decreased activity tolerance;Decreased range of motion;Impaired balance (sitting and/or standing);Pain;Increased edema   OT Treatment/Interventions: Self-care/ADL training;Therapeutic exercise;Energy conservation;DME and/or AE instruction;Manual therapy;Therapeutic activities;Patient/family education;Balance training      OT Goals(Current goals can be found in the care plan section)  Acute Rehab OT Goals Patient Stated Goal: to get better OT Goal Formulation: With patient Time For Goal Achievement: 05/22/24 Potential to Achieve Goals: Good   OT Frequency:  Min 2X/week    Co-evaluation               AM-PAC OT 6 Clicks Daily Activity     Outcome Measure Help from another person eating meals?: None Help from another person taking care of personal grooming?: A Little Help from another person toileting, which includes using toliet, bedpan, or urinal?: A Lot Help from another person bathing (including washing, rinsing, drying)?: A Lot Help from another person to put on and taking off regular upper body clothing?: A Little Help from another person to put on and taking off regular lower body clothing?: A Lot 6 Click Score: 16   End of Session Equipment Utilized During Treatment: Gait belt;Rolling walker (2 wheels) Nurse Communication: Mobility status  Activity Tolerance: Patient tolerated treatment well Patient left: in bed;with call bell/phone within reach;with bed alarm set (sitting EOB)  OT Visit Diagnosis: Other abnormalities of gait and mobility (R26.89);Muscle weakness (generalized) (M62.81);Pain Pain - part of body:  (Feet)                Time: 9160-9096 OT Time Calculation (min): 24 min Charges:  OT General Charges $OT Visit: 1 Visit OT Evaluation $OT Eval Moderate Complexity: 1 Mod OT Treatments $Self Care/Home Management : 8-22 mins  Ronal Gift E. Ishmel Acevedo, OTR/L Acute Rehabilitation Services [phone]   Ronal Gift Salt 05/08/2024, 9:27 AM

## 2024-05-08 NOTE — TOC Initial Note (Signed)
 Transition of Care [facility]) - Initial/Assessment Note    Patient Details  Name: [name] MRN: [id number] Date of Birth: [birth year]  Transition of Care [facility]) CM/SW Contact:    Amyra Vantuyl A [place], LCSW Phone Number: 05/08/2024, 2:58 PM  Clinical Narrative:                  Pt from home alone. Hx of HH at [place], [facility] home care,  currently recommended SNF. CSW spoke with pt and pt's daughter [name] at bedside.   Pt is reluctant to go to rehab. Pt's daughter said that pt did not have support at home and would like pt be faxed out and review bed offers, for possible agreement to SNF. Pt was agreeable to plan for now.   CSW faxed out, bed offers pending. Preference for [facility].   CSW will continue to follow for plan of SNF vs. HH at [place].   Expected Discharge Plan: Skilled Nursing Facility Barriers to Discharge: Continued Medical Work up, SNF Pending bed offer, Insurance Authorization   Patient Goals and CMS Choice            Expected Discharge Plan and Services       Living arrangements for the past 2 months: Apartment                                      Prior Living Arrangements/Services Living arrangements for the past 2 months: Apartment Lives with:: Self          Need for Family Participation in Patient Care: Yes (Comment) Care giver support system in place?: Yes (comment) (pt's daughter [name])      Activities of Daily Living   ADL Screening (condition at time of admission) Independently performs ADLs?: No Does the patient have a NEW difficulty with bathing/dressing/toileting/self-feeding that is expected to last >3 days?: No Does the patient have a NEW difficulty with getting in/out of bed, walking, or climbing stairs that is expected to last >3 days?: Yes (Initiates electronic notice to provider for possible PT consult) Does the patient have a NEW difficulty with communication that is expected to last >3 days?: No Is the patient deaf or have  difficulty hearing?: No Does the patient have difficulty seeing, even when wearing glasses/contacts?: Yes Does the patient have difficulty concentrating, remembering, or making decisions?: No  Permission Sought/Granted                  Emotional Assessment Appearance:: Appears stated age Attitude/Demeanor/Rapport: Guarded Affect (typically observed): Agitated Orientation: : Oriented to Self, Oriented to Place, Oriented to  Time, Oriented to Situation Alcohol / Substance Use: Tobacco Use Psych Involvement: No (comment)  Admission diagnosis:  Acute on chronic HFrEF (heart failure with reduced ejection fraction) (HCC) [I50.23] Patient Active Problem List   Diagnosis Date Noted   Leukocytosis 05/08/2024   Acute on chronic HFrEF (heart failure with reduced ejection fraction) (HCC) 05/07/2024   Pain and swelling of ankle 05/06/2024   Multiple lung nodules 04/23/2024   QT prolongation 04/15/2024   Acute on chronic hypoxic respiratory failure (HCC) 04/13/2024   Right ankle swelling 03/26/2024   Small vessel disease, cerebrovascular 12/28/2023   Esophageal dysphagia 10/10/2022   Preventative health care 08/18/2022   CKD (chronic kidney disease) stage 4, GFR 15-29 ml/min (HCC) 07/26/2022   COPD (chronic obstructive pulmonary disease) (HCC) 07/26/2022   Obesity (BMI 30-39.9) 07/26/2022  Paroxysmal atrial flutter (HCC) 07/25/2022   Multiple thyroid  nodules 07/04/2022   Recurrent falls 07/04/2022   Hepatic steatosis 07/28/2020   Hyperlipidemia    Osteoporosis 02/12/2020   Intrinsic atopic dermatitis 09/19/2019   Hyperkalemia 08/08/2017   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   Acute on chronic systolic heart failure (HCC) 03/18/2016   AKI (acute kidney injury) (HCC)    Spinal stenosis of lumbar region 12/09/2015   Restrictive lung disease 06/24/2015   Diabetic gastroparesis associated with type 2 diabetes mellitus (HCC) 07/12/2014   Depression 05/22/2014   DM (diabetes  mellitus) type II controlled with renal manifestation (HCC) 04/03/2012   Tobacco use disorder 10/26/2009   Essential hypertension 05/25/2007   Chronic combined systolic and diastolic heart failure (HCC) 05/25/2007   ICD (implantable cardioverter-defibrillator) in place 05/04/2007   PCP:  [name], MD Pharmacy:   [facility] [phone] [address], [place] - [address] AT [facility] [address] [place] [address] Phone: [phone] Fax: [phone]  [name] [address] [place] [address] Phone: [phone] Fax: [phone]     Social Drivers of Health (SDOH) Social History: SDOH Screenings   Food Insecurity: No Food Insecurity (05/07/2024)  Housing: Low Risk  (05/07/2024)  Transportation Needs: No Transportation Needs (05/07/2024)  Utilities: Not At Risk (05/07/2024)  Alcohol Screen: Low Risk  (01/09/2024)  Depression (PHQ2-9): Low Risk  (05/03/2024)  Financial Resource Strain: Low Risk  (01/09/2024)  Physical Activity: Inactive (01/09/2024)  Social Connections: Moderately Integrated (05/07/2024)  Stress: No Stress Concern Present (01/09/2024)  Tobacco Use: High Risk (05/07/2024)  Health Literacy: Adequate Health Literacy (01/09/2024)   SDOH Interventions:     Readmission Risk Interventions    06/22/2022   11:43 AM  Readmission Risk Prevention Plan  Transportation Screening Complete  PCP or Specialist Appt within 5-7 Days Complete  Home Care Screening Complete  Medication Review (RN CM) Referral to Pharmacy

## 2024-05-08 NOTE — Care Management Obs Status (Signed)
 MEDICARE OBSERVATION STATUS NOTIFICATION   Patient Details  Name: [name] MRN: [id number] Date of Birth: 05-27-1951   Medicare Observation Status Notification Given:  Yes  Obs status explained to the patient and a copy given.   Dayon Witt 05/08/2024, 10:48 AM

## 2024-05-09 ENCOUNTER — Encounter: Admitting: Student

## 2024-05-09 ENCOUNTER — Other Ambulatory Visit

## 2024-05-09 DIAGNOSIS — N3001 Acute cystitis with hematuria: Secondary | ICD-10-CM

## 2024-05-09 LAB — CBC
HCT: 31.3 % — ABNORMAL LOW (ref 36.0–46.0)
Hemoglobin: 9.7 g/dL — ABNORMAL LOW (ref 12.0–15.0)
MCH: 24.9 pg — ABNORMAL LOW (ref 26.0–34.0)
MCHC: 31 g/dL (ref 30.0–36.0)
MCV: 80.5 fL (ref 80.0–100.0)
Platelets: 371 K/uL (ref 150–400)
RBC: 3.89 MIL/uL (ref 3.87–5.11)
RDW: 15.9 % — ABNORMAL HIGH (ref 11.5–15.5)
WBC: 12 K/uL — ABNORMAL HIGH (ref 4.0–10.5)
nRBC: 0 % (ref 0.0–0.2)

## 2024-05-09 LAB — BASIC METABOLIC PANEL WITH GFR
Anion gap: 10 (ref 5–15)
BUN: 40 mg/dL — ABNORMAL HIGH (ref 8–23)
CO2: 27 mmol/L (ref 22–32)
Calcium: 8.8 mg/dL — ABNORMAL LOW (ref 8.9–10.3)
Chloride: 102 mmol/L (ref 98–111)
Creatinine, Ser: 1.73 mg/dL — ABNORMAL HIGH (ref 0.44–1.00)
GFR, Estimated: 31 mL/min — ABNORMAL LOW (ref >60)
Glucose, Bld: 305 mg/dL — ABNORMAL HIGH (ref 70–99)
Potassium: 3.7 mmol/L (ref 3.5–5.1)
Sodium: 139 mmol/L (ref 135–145)

## 2024-05-09 LAB — GLUCOSE, CAPILLARY
Glucose-Capillary: 246 mg/dL — ABNORMAL HIGH (ref 70–99)
Glucose-Capillary: 222 mg/dL — ABNORMAL HIGH (ref 70–99)
Glucose-Capillary: 357 mg/dL — ABNORMAL HIGH (ref 70–99)
Glucose-Capillary: 120 mg/dL — ABNORMAL HIGH (ref 70–99)

## 2024-05-09 MED ORDER — INSULIN GLARGINE-YFGN 100 UNIT/ML ~~LOC~~ SOLN
5.0000 [IU] | Freq: Every day | SUBCUTANEOUS | Status: DC
  Administered 2024-05-09 – 2024-05-12 (×4): 5 [IU] via SUBCUTANEOUS
  Filled 2024-05-09 (×5): qty 0.05

## 2024-05-09 NOTE — TOC Progression Note (Signed)
 Transition of Care [facility]) - Progression Note    Patient Details  Name: [name] MRN: [id number] Date of Birth: 07/25/1951  [facility]) CM/SW Contact  Maxim Bedel A [place], LCSW Phone Number: 05/09/2024, 3:43 PM  Clinical Narrative:     CSW met with pt at bedside to provide bed offers with Medicare.gov ratings. She requested CSW to follow up with her daughter [name] regarding. CSW contacted and spoke with pt's daughter, provided bed offers with Medicare.gov ratings and said she would talk with pt and come to a decision on SNF choice. CSW contact information provided.   TOC will continue to follow.    Expected Discharge Plan: Skilled Nursing Facility Barriers to Discharge: Continued Medical Work up, SNF Pending bed offer, Insurance Authorization               Expected Discharge Plan and Services       Living arrangements for the past 2 months: Apartment                                       Social Drivers of Health (SDOH) Interventions SDOH Screenings   Food Insecurity: No Food Insecurity (05/07/2024)  Housing: Low Risk  (05/07/2024)  Transportation Needs: No Transportation Needs (05/07/2024)  Utilities: Not At Risk (05/07/2024)  Alcohol Screen: Low Risk  (01/09/2024)  Depression (PHQ2-9): Low Risk  (05/03/2024)  Financial Resource Strain: Low Risk  (01/09/2024)  Physical Activity: Inactive (01/09/2024)  Social Connections: Moderately Integrated (05/07/2024)  Stress: No Stress Concern Present (01/09/2024)  Tobacco Use: High Risk (05/07/2024)  Health Literacy: Adequate Health Literacy (01/09/2024)    Readmission Risk Interventions    06/22/2022   11:43 AM  Readmission Risk Prevention Plan  Transportation Screening Complete  PCP or Specialist Appt within 5-7 Days Complete  Home Care Screening Complete  Medication Review (RN CM) Referral to Pharmacy

## 2024-05-09 NOTE — Progress Notes (Addendum)
 HD#1 SUBJECTIVE:  Patient Summary: [name] is a 73 y.o. female with PMH of HFrEF (04/2024 EF: 20-25%), CKD 4, COPD, HTN, T2DM, HLD, PAF on Eliquis , and R ankle fx s/p ORIF who presents following abnormal labs and shortness of breath and admitted for AKI on CKD and possible UTI. Overnight Events: None  Interim History: Patient states she feels about the same as she had yesterday. Her appetite is good and had breakfast this morning. Denies dysuria, abdominal pain, SOB, no nausea/vomiting. Last BM was normal yesterday at a brown color with no blood. Last ozempic  dose was Sunday morning. Her legs feel alright without pain. She is unsure of what color he urine is, as she had an in/out cath for her urine sample. She is asking clarifying questions about rehab and what it entailed. She never worked with PT/OT because she was too tired to stand up.   OBJECTIVE:  Vital Signs: Vitals:   05/08/24 2152 05/09/24 0022 05/09/24 0436 05/09/24 0439  BP: (!) 148/68 (!) 105/55 (!) 131/54   Pulse: 87 83 81   Resp: 20 18 18    Temp: 98.2 F (36.8 C) 97.9 F (36.6 C) 98 F (36.7 C)   TempSrc:   Oral   SpO2: 98% 94% 97%   Weight:    75.3 kg  Height:       Supplemental O2: Room Air SpO2: 97 %  Filed Weights   05/07/24 1315 05/08/24 0500 05/09/24 0439  Weight: 78 kg 78.1 kg 75.3 kg     Intake/Output Summary (Last 24 hours) at 05/09/2024 9287 Last data filed at 05/09/2024 0451 Gross per 24 hour  Intake 102.51 ml  Output 200 ml  Net -97.49 ml   Net IO Since Admission: 313.09 mL [05/09/24 0712]  Physical Exam: Physical Exam Cardiovascular:     Rate and Rhythm: Normal rate and regular rhythm.  Pulmonary:     Effort: Pulmonary effort is normal.     Breath sounds: Normal breath sounds.  Abdominal:     General: Abdomen is flat. Bowel sounds are normal.     Palpations: Abdomen is soft.  Skin:    General: Skin is warm and dry.  Neurological:     Mental Status: She is alert.     Patient  Lines/Drains/Airways Status     Active Line/Drains/Airways     Name Placement date Placement time Site Days   Peripheral IV 05/07/24 Right Antecubital 05/07/24  --  Antecubital  2            Pertinent labs and imaging:     Latest Ref Rng & Units 05/09/2024    2:21 AM 05/08/2024   12:48 AM 05/07/2024    2:36 PM  CBC  WBC 4.0 - 10.5 K/uL 12.0  12.7  13.1   Hemoglobin 12.0 - 15.0 g/dL 9.7  88.0  87.8   Hematocrit 36.0 - 46.0 % 31.3  38.0  38.9   Platelets 150 - 400 K/uL 371  431  246        Latest Ref Rng & Units 05/09/2024    2:21 AM 05/08/2024   12:48 AM 05/07/2024    2:36 PM  CMP  Glucose 70 - 99 mg/dL 694  825  737   BUN 8 - 23 mg/dL 40  50  50   Creatinine 0.44 - 1.00 mg/dL 8.26  7.50  7.06   Sodium 135 - 145 mmol/L 139  140  137   Potassium 3.5 - 5.1 mmol/L  3.7  3.1  2.8   Chloride 98 - 111 mmol/L 102  100  96   CO2 22 - 32 mmol/L 27  26  25    Calcium  8.9 - 10.3 mg/dL 8.8  9.1  9.1     No results found.  ASSESSMENT/PLAN:  Assessment: Principal Problem:   Acute on chronic HFrEF (heart failure with reduced ejection fraction) (HCC) Active Problems:   AKI (acute kidney injury) (HCC)   Leukocytosis   Plan:   ##AKI on CKD 4 #Likely UTI Likely due to volume depletion resulting in kidney hypoperfusion, volume overload unlikely. Scr continues to improve 2.49>1.73. Renal US  was unremarkable. Bladder scan showed 101 mL, reassuring against retention. Potassium and magnesium  have been low but responsive to electrolyte replenishment. Will continue to monitor electrolyte status and correct as appropriate. Will hold nephrotoxic medications and continue to monitor renal function.   CRP on 8/4 was elevated at 22.7. UA showed likely infection and mild hemoglobinuria. Leukocytosis has continued to improved 12.7>12.0. Thrombocytosis resolved form yesterday, but anemia has worsened Hb 11.9>9.7. She has remained afebrile and denied any dysuria or flank pain. Awaiting culture.  Will closely  monitor volume status and renal function. She has a history of trichomonas which was treated earlier this year, so we will plan to inquire more in depth about vaginal discharge and associated symptoms as well as new sexual encounters.  -Urine culture pending -Ceftriaxone  (Rocephin ) IV 1g q24h -Scr improving, BMP otherwise stable, monitoring renal function -Monitoring volume status -Compression stockings -Strict I/Os -Daily weights  -AM CBC and BMP  #Chronic Biventricular Heart Failure #BLE edema Last echo on 04/14/24 showed LVEF of 20-25% and mildly reduced right ventricular function. She has continued to have diastolic hypotension and mildly elevated pulse pressure, but otherwise hemodynamically stable, afebrile and oxygenating well on room air. Lungs were clear to auscultation. Bedside ultrasound 8/6 demonstrated IVC collapsibility inconsistent with fluid overload. She notes BLE edema and tenderness has improved, but unable to assess due to compression stockings.  -JVD up slightly from yesterday but not much, may add back some GDMT/mild diuretic therapy today vs tomorrow  #T2DM Most recent A1C on 03/25/24 was 7.5. Patient measures glucose at home with Dexcom. Takes home Ozempic , Farxiga , and Tresiba  5 units daily. Farxiga  held d/t AKI. She was on SSI but blood sugars have been elevated, most recently 305. Will restart home glargine and continue SSI. -Continue home sliding scale insulin  -Glargine (Semglee ) 5 units - Hold home Farxiga    #New onset emesis She had an episode of emesis on 8/6 while trying to ambulate shortly after meal. No associated symptoms. Described as food contents, with no hematemesis. She denied ongoing nausea, abdominal tenderness. EKG showed normal Qt. She was offered Zofran  but refused. There has been no recurrence of symptoms.    #COPD Patient notes most of functional impairment is due to pain, but also reports increased dyspnea on exertion. Appeared stable on room  air. - 2 L/min O2 via [place] if needed - Continue home umeclidinium-vilanterol (Anoro Elipta) 62.5-25 MCG/ACT 1 puff     #HLD Most recent LDL 12/28/23 was 122. -Continue home atorvastatin  (Lipitor ) 80 mg -Follow up with PCP   #Paroxysmal Atrial Flutter Stable. NSR on EKG.  -continue home apixaban  (Eliquis ) 5 mg   #Multiple lung nodules Noted incidentally on CT 04/2024. -CT w/o contrast scheduled on 08/11  Best Practice: Diet: Renal diet VTE: Place TED hose Start: 05/08/24 0754 Code: Full  Disposition planning: Therapy Recs: SNF,  DISPO: Anticipated  discharge 1-2 days to Skilled nursing facility pending Urine culture.  Signature:  Nunzio DELENA Bolt [name]  7:12 AM, 05/09/2024   Attestation for Student Documentation:  I personally was present and re-performed the history, physical exam and medical decision-making activities of this service and have verified that the service and findings are accurately documented in the student's note.  Doyal Miyamoto, MD 05/09/2024, 11:52 AM  On Call pager [phone]

## 2024-05-09 NOTE — Progress Notes (Signed)
 Physical Therapy Treatment Patient Details Name: [name] MRN: [id number] DOB: 01/01/1951 Today's Date: 05/09/2024   History of Present Illness Pt is a 73 y/o female presenting to the ED with shortness of breath and admitted for AKI on CKD in the setting of acute on chronic heart failure on 05/07/24. PMH significant for COPD Gold stage II with chronic hypoxic respite failure on nocturnal nasal cannula, chronic combined HFpEF and HFrEF, LVEF 20-25%, status post AICD, IIDM, cigarette smoker, HTN, HLD, lipoma, depression.    PT Comments  Pt admitted with above diagnosis. Pt was able to ambulate with RW with +2 mod assist 8 feet before fatiguing. Pt with poor safety awareness and poor balance at times. Pt needs +2 assist for safety with transitions. Will continue to follow acutely.  Pt currently with functional limitations due to the deficits listed below (see PT Problem List). Pt will benefit from acute skilled PT to increase their independence and safety with mobility to allow discharge.       If plan is discharge home, recommend the following: Two people to help with walking and/or transfers;A lot of help with bathing/dressing/bathroom   Can travel by private vehicle     No  Equipment Recommendations  None recommended by PT    Recommendations for Other Services       Precautions / Restrictions Precautions Precautions: Fall Recall of Precautions/Restrictions: Intact Precaution/Restrictions Comments: monitor O2 (only wears at night) Restrictions Weight Bearing Restrictions Per Provider Order: No     Mobility  Bed Mobility Overal bed mobility: Needs Assistance Bed Mobility: Supine to Sit     Supine to sit: Min assist     General bed mobility comments: Needed assist to come to eOB for trunk and to initiate LES    Transfers Overall transfer level: Needs assistance Equipment used: Rolling walker (2 wheels) Transfers: Sit to/from Stand, Bed to chair/wheelchair/BSC Sit to  Stand: Mod assist, +2 physical assistance, +2 safety/equipment   Step pivot transfers: Min assist, +2 safety/equipment       General transfer comment: First attempt, pt could not stand fully.  Second attempt, pt with posterior lean needing incr time to get balance.  After pt got balance was able to stand and pivot to chair with use of RW    Ambulation/Gait Ambulation/Gait assistance: Min assist, +2 physical assistance, +2 safety/equipment Gait Distance (Feet): 8 Feet Assistive device: Rolling walker (2 wheels) Gait Pattern/deviations: Step-through pattern, Decreased stride length, Shuffle, Leaning posteriorly, Drifts right/left, Trunk flexed, Wide base of support Gait velocity: dec Gait velocity interpretation: <1.31 ft/sec, indicative of household ambulator   General Gait Details: Pt c/o pain on top of left foot initially when standing. Once she took the pivotal steps to chair, pt wanted to try and ambulate and was able to ambulate a few steps with max cues for sequencing steps and RW. once pt went 8 feet, began to flex at trunk and hips therefore brought chair to pt. Pt with poor safety awareness.   Stairs             Wheelchair Mobility     Tilt Bed    Modified Rankin (Stroke Patients Only)       Balance Overall balance assessment: Needs assistance Sitting-balance support: Feet supported, No upper extremity supported Sitting balance-Leahy Scale: Fair Sitting balance - Comments: did not challenge   Standing balance support: Reliant on assistive device for balance, During functional activity, Bilateral upper extremity supported Standing balance-Leahy Scale: Poor Standing balance comment: Able  to stand with RW and +2 assist                            Communication Communication Communication: No apparent difficulties  Cognition Arousal: Alert Behavior During Therapy: WFL for tasks assessed/performed   PT - Cognitive impairments: No apparent  impairments                         Following commands: Intact      Cueing Cueing Techniques: Verbal cues  Exercises General Exercises - Lower Extremity Ankle Circles/Pumps: AROM, Both, 10 reps, Supine Long Arc Quad: AROM, Both, 10 reps, Seated Hip Flexion/Marching: AROM, Both, 10 reps, Seated    General Comments General comments (skin integrity, edema, etc.): VSS      Pertinent Vitals/Pain Pain Assessment Pain Assessment: Faces Faces Pain Scale: Hurts little more Pain Location: distal BLE's near ankle Pain Descriptors / Indicators: Grimacing, Guarding, Sharp, Tender Pain Intervention(s): Limited activity within patient's tolerance, Monitored during session, Repositioned    Home Living                          Prior Function            PT Goals (current goals can now be found in the care plan section) Acute Rehab PT Goals Patient Stated Goal: did not state Progress towards PT goals: Progressing toward goals    Frequency    Min 3X/week      PT Plan      Co-evaluation              AM-PAC PT 6 Clicks Mobility   Outcome Measure  Help needed turning from your back to your side while in a flat bed without using bedrails?: A Little Help needed moving from lying on your back to sitting on the side of a flat bed without using bedrails?: A Little Help needed moving to and from a bed to a chair (including a wheelchair)?: A Lot Help needed standing up from a chair using your arms (e.g., wheelchair or bedside chair)?: A Lot Help needed to walk in hospital room?: Total Help needed climbing 3-5 steps with a railing? : Total 6 Click Score: 12    End of Session Equipment Utilized During Treatment: Gait belt Activity Tolerance: Patient limited by fatigue Patient left: with call bell/phone within reach;in chair;with chair alarm set Nurse Communication: Mobility status PT Visit Diagnosis: Muscle weakness (generalized) (M62.81);Difficulty in  walking, not elsewhere classified (R26.2)     Time: 8949-8884 PT Time Calculation (min) (ACUTE ONLY): 25 min  Charges:    $Gait Training: 8-22 mins $Therapeutic Exercise: 8-22 mins PT General Charges $$ ACUTE PT VISIT: 1 Visit                     Lakeith Careaga M,PT Acute Rehab Services [phone]    Stephane JULIANNA Bevel 05/09/2024, 2:08 PM

## 2024-05-09 NOTE — Progress Notes (Signed)
 Mobility Specialist: Progress Note   05/09/24 1626  Mobility  Activity Pivoted/transferred from chair to bed  Level of Assistance Moderate assist, patient does 50-74%  Assistive Device Front wheel walker  Activity Response Tolerated well  Mobility Referral Yes  Mobility visit 1 Mobility  Mobility Specialist Start Time (ACUTE ONLY) 1237  Mobility Specialist Stop Time (ACUTE ONLY) 1246  Mobility Specialist Time Calculation (min) (ACUTE ONLY) 9 min    Pt received in chair, requesting assistance to get back to bed. Heavy modA for STS and transfer to chair. Pt with heavy posterior lean and mod verbal cues needed for RW management and posture. MinA for Sit>supine. Left in bed with all needs met, call bell in reach.   Ileana Lute Mobility Specialist Please contact via SecureChat or Rehab office at [phone]

## 2024-05-10 LAB — COMPREHENSIVE METABOLIC PANEL WITH GFR
ALT: 10 U/L (ref 0–44)
AST: 13 U/L — ABNORMAL LOW (ref 15–41)
Albumin: 2.3 g/dL — ABNORMAL LOW (ref 3.5–5.0)
Alkaline Phosphatase: 92 U/L (ref 38–126)
Anion gap: 11 (ref 5–15)
BUN: 33 mg/dL — ABNORMAL HIGH (ref 8–23)
CO2: 28 mmol/L (ref 22–32)
Calcium: 9 mg/dL (ref 8.9–10.3)
Chloride: 106 mmol/L (ref 98–111)
Creatinine, Ser: 1.44 mg/dL — ABNORMAL HIGH (ref 0.44–1.00)
GFR, Estimated: 39 mL/min — ABNORMAL LOW (ref >60)
Glucose, Bld: 256 mg/dL — ABNORMAL HIGH (ref 70–99)
Potassium: 4.3 mmol/L (ref 3.5–5.1)
Sodium: 145 mmol/L (ref 135–145)
Total Bilirubin: 0.4 mg/dL (ref 0.0–1.2)
Total Protein: 6.2 g/dL — ABNORMAL LOW (ref 6.5–8.1)

## 2024-05-10 LAB — CBC
HCT: 31.5 % — ABNORMAL LOW (ref 36.0–46.0)
Hemoglobin: 9.6 g/dL — ABNORMAL LOW (ref 12.0–15.0)
MCH: 25.1 pg — ABNORMAL LOW (ref 26.0–34.0)
MCHC: 30.5 g/dL (ref 30.0–36.0)
MCV: 82.5 fL (ref 80.0–100.0)
Platelets: 422 K/uL — ABNORMAL HIGH (ref 150–400)
RBC: 3.82 MIL/uL — ABNORMAL LOW (ref 3.87–5.11)
RDW: 16 % — ABNORMAL HIGH (ref 11.5–15.5)
WBC: 13.8 K/uL — ABNORMAL HIGH (ref 4.0–10.5)
nRBC: 0 % (ref 0.0–0.2)

## 2024-05-10 LAB — GLUCOSE, CAPILLARY
Glucose-Capillary: 180 mg/dL — ABNORMAL HIGH (ref 70–99)
Glucose-Capillary: 339 mg/dL — ABNORMAL HIGH (ref 70–99)
Glucose-Capillary: 344 mg/dL — ABNORMAL HIGH (ref 70–99)
Glucose-Capillary: 137 mg/dL — ABNORMAL HIGH (ref 70–99)

## 2024-05-10 LAB — URINE CULTURE: Culture: >=100000 — AB

## 2024-05-10 MED ORDER — SPIRONOLACTONE 12.5 MG HALF TABLET
12.5000 mg | ORAL_TABLET | Freq: Every day | ORAL | Status: DC
  Administered 2024-05-10 – 2024-05-13 (×5): 12.5 mg via ORAL
  Filled 2024-05-10 (×4): qty 1

## 2024-05-10 NOTE — TOC Progression Note (Addendum)
 Transition of Care [facility]) - Progression Note    Patient Details  Name: [name] MRN: [id number] Date of Birth: 1951/04/11  [facility] CM/SW Contact  Lyfe Reihl A [place], LCSW Phone Number: 05/10/2024, 1:28 PM  Clinical Narrative:     Update 1530 CSW followed up at pt's bedside, she said that she and [name] had made a decision for placement to [facility] and rehab. CSW notified facility, CAN'T DC OVER WEEKEND, can take pt as early as Monday. CSW started insurance authorization in event pt is stable next 1-2 days per MD note.   Status pending, reference ID: [id number].     1328 CSW contacted pt's daughter [name], to get an updated on bed decision. CSW unable to leave vm as she did not answer.     Expected Discharge Plan: Skilled Nursing Facility Barriers to Discharge: Continued Medical Work up, SNF Pending bed offer, Insurance Authorization               Expected Discharge Plan and Services       Living arrangements for the past 2 months: Apartment                                       Social Drivers of Health (SDOH) Interventions SDOH Screenings   Food Insecurity: No Food Insecurity (05/07/2024)  Housing: Low Risk  (05/07/2024)  Transportation Needs: No Transportation Needs (05/07/2024)  Utilities: Not At Risk (05/07/2024)  Alcohol Screen: Low Risk  (01/09/2024)  Depression (PHQ2-9): Low Risk  (05/03/2024)  Financial Resource Strain: Low Risk  (01/09/2024)  Physical Activity: Inactive (01/09/2024)  Social Connections: Moderately Integrated (05/07/2024)  Stress: No Stress Concern Present (01/09/2024)  Tobacco Use: High Risk (05/07/2024)  Health Literacy: Adequate Health Literacy (01/09/2024)    Readmission Risk Interventions    06/22/2022   11:43 AM  Readmission Risk Prevention Plan  Transportation Screening Complete  PCP or Specialist Appt within 5-7 Days Complete  Home Care Screening Complete  Medication Review (RN CM) Referral to Pharmacy

## 2024-05-10 NOTE — Progress Notes (Addendum)
 HD#2 SUBJECTIVE:  Patient Summary:  [name] is a 73 y.o. female with PMH of HFrEF (04/2024 EF: 20-25%), CKD 4, COPD, HTN, T2DM, HLD, PAF on Eliquis , and R ankle fx s/p ORIF who presents following abnormal labs and shortness of breath and admitted for AKI on CKD in the setting of a UTI.  Overnight Events: None  Interim History: Patient states she slept well, feeling well and better overall. States she has an appetite but the food isn't to her liking. Denies SOB, abdominal pain, or nausea or vomiting. Took off TED hose last night. Wanting to start spironolactone  12.5mg   OBJECTIVE:  Vital Signs: Vitals:   05/10/24 0530 05/10/24 0618 05/10/24 0802 05/10/24 0833  BP: (!) 106/91   (!) 133/58  Pulse: 79   77  Resp: 18     Temp: 98.4 F (36.9 C)   98.3 F (36.8 C)  TempSrc: Oral     SpO2: 100%  99% 98%  Weight:  76.2 kg    Height:       Supplemental O2: Room Air SpO2: 98 %  Filed Weights   05/08/24 0500 05/09/24 0439 05/10/24 0618  Weight: 78.1 kg 75.3 kg 76.2 kg     Intake/Output Summary (Last 24 hours) at 05/10/2024 1048 Last data filed at 05/10/2024 0636 Gross per 24 hour  Intake 120.66 ml  Output 250 ml  Net -129.34 ml   Net IO Since Admission: 183.75 mL [05/10/24 1048]  Physical Exam: Physical Exam Neck:     Vascular: JVD present.     Comments: JVD ~10 cm stable from yesterday. Cardiovascular:     Rate and Rhythm: Normal rate and regular rhythm.     Pulses:          Dorsalis pedis pulses are 2+ on the right side and 2+ on the left side.  Pulmonary:     Effort: Pulmonary effort is normal. No respiratory distress.     Breath sounds: Examination of the right-middle field reveals rhonchi. Examination of the left-middle field reveals rhonchi. Examination of the right-lower field reveals rhonchi. Examination of the left-lower field reveals rhonchi. Rhonchi present.     Comments: Mild coarse breath sounds on midlung and bases L>R Musculoskeletal:     Right lower leg:  1+ Pitting Edema present.     Left lower leg: 1+ Pitting Edema present.     Patient Lines/Drains/Airways Status     Active Line/Drains/Airways     Name Placement date Placement time Site Days   Peripheral IV 05/07/24 Right Antecubital 05/07/24  --  Antecubital  3            Pertinent labs and imaging:      Latest Ref Rng & Units 05/10/2024    2:02 AM 05/09/2024    2:21 AM 05/08/2024   12:48 AM  CBC  WBC 4.0 - 10.5 K/uL 13.8  12.0  12.7   Hemoglobin 12.0 - 15.0 g/dL 9.6  9.7  88.0   Hematocrit 36.0 - 46.0 % 31.5  31.3  38.0   Platelets 150 - 400 K/uL 422  371  431        Latest Ref Rng & Units 05/10/2024    2:02 AM 05/09/2024    2:21 AM 05/08/2024   12:48 AM  CMP  Glucose 70 - 99 mg/dL 743  694  825   BUN 8 - 23 mg/dL 33  40  50   Creatinine 0.44 - 1.00 mg/dL 8.55  8.26  7.50  Sodium 135 - 145 mmol/L 145  139  140   Potassium 3.5 - 5.1 mmol/L 4.3  3.7  3.1   Chloride 98 - 111 mmol/L 106  102  100   CO2 22 - 32 mmol/L 28  27  26    Calcium  8.9 - 10.3 mg/dL 9.0  8.8  9.1   Total Protein 6.5 - 8.1 g/dL 6.2     Total Bilirubin 0.0 - 1.2 mg/dL 0.4     Alkaline Phos 38 - 126 U/L 92     AST 15 - 41 U/L 13     ALT 0 - 44 U/L 10       No results found.  ASSESSMENT/PLAN:  Assessment: Principal Problem:   Acute on chronic HFrEF (heart failure with reduced ejection fraction) (HCC) Active Problems:   AKI (acute kidney injury) (HCC)   Leukocytosis   Acute cystitis with hematuria   Plan:   ##Klebsiella Pneumoniae UTI #Resolved AKI on CKD 4 Renal function largely improved with Scr of 1.44. Plan to restart GDMT as tolerated. Will continue to monitor electrolyte status and correct as appropriate.    Urine culture was positive for ampicillin-resistant klebsiella pneumoniae, sensitive to CTX.  She is on day 3 of IV CTX. Leukocytosis increased from 12.0 to 13.8. She denies UTI symptoms (dysuria, urgency, suprapubic tenderness). Given mild elevation in leukocytosis compared to day  prior, will continue IV abx for a total of 5 days. -Ceftriaxone  (Rocephin ) IV 1g q24h for 5 days (i8/5) -Scr improving, BMP otherwise stable, monitoring renal function -Monitoring volume status -Strict I/Os -Daily weights  -AM CBC and BMP   #Chronic Biventricular Heart Failure #BLE edema Last echo on 04/14/24 showed LVEF of 20-25% and mildly reduced right ventricular function. She has continued to have diastolic hypotension and mildly elevated pulse pressure, but otherwise hemodynamically stable, afebrile and oxygenating well on room air. Bedside ultrasound 8/6 demonstrated IVC collapsibility inconsistent with fluid overload. Compression stockings removed 08/07.  On exam today, lungs had mild crackles bilaterally. Trace BLE pitting edema noted, tenderness has improved. Mild JVD remains stable from yesterday, will resume home spironolactone  considering improved renal function. -Spironolactone  12.5 mg daily  #T2DM Most recent A1C on 03/25/24 was 7.5. Patient measures glucose at home with Dexcom. Takes home Ozempic , Farxiga , and Tresiba  5 units daily. Farxiga  held d/t UTI. Glargine restarted 8/7 due to high sugars. Fasting blood glucose has improved today (180). -Continue home sliding scale insulin  -Continue home Glargine (Semglee ) 5 units -Hold home Farxiga     #New onset emesis She had an episode of emesis on 8/6 while trying to ambulate shortly after meal. No associated symptoms. Described as food contents, with no hematemesis. She denied ongoing nausea, abdominal tenderness. EKG showed normal Qt. She was offered Zofran  but refused. There has been no recurrence of symptoms as of 8/8.    #COPD Patient notes most of functional impairment is due to pain, but also reports increased dyspnea on exertion. Appeared stable on room air. - 2 L/min O2 via [place] if needed - Continue home umeclidinium-vilanterol (Anoro Elipta) 62.5-25 MCG/ACT 1 puff    #HLD Most recent LDL 12/28/23 was 122. -Continue home  atorvastatin  (Lipitor ) 80 mg -Follow up with PCP   #Paroxysmal Atrial Flutter Stable. NSR on EKG.  -continue home apixaban  (Eliquis ) 5 mg   #Multiple lung nodules Noted incidentally on CT 04/2024. -CT w/o contrast scheduled on 08/11   Best Practice: Diet: Renal diet VTE: Place TED hose Start: 05/08/24 0754 Code: Full  Disposition planning: Therapy Recs: SNF,  DISPO: Anticipated discharge 2 days to Skilled nursing facility pending IV abx and placement in a SNF.   Signature:  Nunzio DELENA Bolt [name]  10:48 AM, 05/10/2024  On Call pager [phone]   Attestation for Student Documentation:  I personally was present and re-performed the history, physical exam and medical decision-making activities of this service and have verified that the service and findings are accurately documented in the student's note.  Doyal Miyamoto, MD 05/10/2024, 11:24 AM

## 2024-05-10 NOTE — Inpatient Diabetes Management (Signed)
 Inpatient Diabetes Program Recommendations  AACE/ADA: New Consensus Statement on Inpatient Glycemic Control (2015)  Target Ranges:  Prepandial:   less than 140 mg/dL      Peak postprandial:   less than 180 mg/dL (1-2 hours)      Critically ill patients:  140 - 180 mg/dL    Latest Reference Range & Units 05/09/24 08:00 05/09/24 13:04 05/09/24 16:35 05/09/24 22:00  Glucose-Capillary 70 - 99 mg/dL 753 (H) 777 (H) 642 (H) 120 (H)  (H): Data is abnormally high  Latest Reference Range & Units 05/10/24 08:32 05/10/24 12:12  Glucose-Capillary 70 - 99 mg/dL 819 (H) 660 (H)  (H): Data is abnormally high     Home DM Meds: Farxiga  10 mg daily       Tresiba  5 units daily       Ozempic  1 mg Qweek  Current Orders: Semglee  5 units at HS     Novolog  Moderate Correction Scale/ SSI (0-15 units) TID AC    MD- Note Semglee  5 units started last PM  CBG 339 at Lunch today  If eating well, may consider adding low dose Novolog  Meal Coverage: Novolog  3 units TID with meals HOLD if pt NPO HOLD if pt eats <50% meals    --Will follow patient during hospitalization--  Adina Rudolpho Arrow RN, MSN, CDCES Diabetes Coordinator Inpatient Glycemic Control Team Team Pager: [phone] (8a-5p)

## 2024-05-10 NOTE — Plan of Care (Signed)

## 2024-05-10 NOTE — Plan of Care (Signed)

## 2024-05-10 NOTE — Progress Notes (Signed)
 Physical Therapy Treatment Patient Details Name: [name] MRN: [id number] DOB: [birth year] Today's Date: 05/10/2024   History of Present Illness Pt is a 73 y/o female presenting to the ED with shortness of breath and admitted for AKI on CKD in the setting of acute on chronic heart failure on 05/07/24. PMH significant for COPD Gold stage II with chronic hypoxic respite failure on nocturnal nasal cannula, chronic combined HFpEF and HFrEF, LVEF 20-25%, status post AICD, IIDM, cigarette smoker, HTN, HLD, lipoma, depression.    PT Comments  Pt admitted with above diagnosis. Pt was able to progress ambulation with less cuing and assist and incr distance. Still needs +2 for safety and very limited distance. Continues to need post acute rehab < 3 hours day.  Pt currently with functional limitations due to the deficits listed below (see PT Problem List). Pt will benefit from acute skilled PT to increase their independence and safety with mobility to allow discharge.       If plan is discharge home, recommend the following: Two people to help with walking and/or transfers;A lot of help with bathing/dressing/bathroom   Can travel by private vehicle     No  Equipment Recommendations  None recommended by PT    Recommendations for Other Services       Precautions / Restrictions Precautions Precautions: Fall Recall of Precautions/Restrictions: Intact Precaution/Restrictions Comments: monitor O2 (only wears at night) Restrictions Weight Bearing Restrictions Per Provider Order: No     Mobility  Bed Mobility Overal bed mobility: Needs Assistance Bed Mobility: Supine to Sit     Supine to sit: Min assist Sit to supine: Min assist   General bed mobility comments: Needed assist to come to eOB for trunk and to initiate LES    Transfers Overall transfer level: Needs assistance Equipment used: Rolling walker (2 wheels) Transfers: Sit to/from Stand, Bed to chair/wheelchair/BSC Sit to Stand: Mod  assist, +2 physical assistance, +2 safety/equipment           General transfer comment: Pt initially with posterior lean needing incr time to get balance.    Ambulation/Gait Ambulation/Gait assistance: Min assist, +2 physical assistance, +2 safety/equipment Gait Distance (Feet): 18 Feet Assistive device: Rolling walker (2 wheels) Gait Pattern/deviations: Step-through pattern, Decreased stride length, Shuffle, Leaning posteriorly, Drifts right/left, Trunk flexed, Wide base of support Gait velocity: dec Gait velocity interpretation: <1.31 ft/sec, indicative of household ambulator   General Gait Details: Pt c/o pain on top of left foot initially when standing and was able to ambulate a with mod cues for sequencing steps and RW. Pt walked to door and PT brought chair to pt. Pt with poor safety awareness.   Stairs             Wheelchair Mobility     Tilt Bed    Modified Rankin (Stroke Patients Only)       Balance Overall balance assessment: Needs assistance Sitting-balance support: Feet supported, No upper extremity supported Sitting balance-Leahy Scale: Fair Sitting balance - Comments: did not challenge   Standing balance support: Reliant on assistive device for balance, During functional activity, Bilateral upper extremity supported Standing balance-Leahy Scale: Poor Standing balance comment: Able to stand with RW and +2 assist                            Communication Communication Communication: No apparent difficulties  Cognition Arousal: Alert Behavior During Therapy: WFL for tasks assessed/performed   PT - Cognitive  impairments: No apparent impairments                         Following commands: Intact      Cueing Cueing Techniques: Verbal cues  Exercises General Exercises - Lower Extremity Ankle Circles/Pumps: AROM, Both, 10 reps, Supine Quad Sets: AROM, Both, 15 reps, Supine Gluteal Sets: AROM, Both, 15 reps, Supine Long Arc  Quad: AROM, Both, 10 reps, Seated Hip Flexion/Marching: AROM, Both, 10 reps, Seated    General Comments General comments (skin integrity, edema, etc.): VSS      Pertinent Vitals/Pain Pain Assessment Pain Assessment: Faces Faces Pain Scale: Hurts little more Pain Location: distal BLE's near ankle Pain Descriptors / Indicators: Grimacing, Guarding, Sharp, Tender Pain Intervention(s): Limited activity within patient's tolerance, Monitored during session, Repositioned    Home Living                          Prior Function            PT Goals (current goals can now be found in the care plan section) Progress towards PT goals: Progressing toward goals    Frequency    Min 3X/week      PT Plan      Co-evaluation              AM-PAC PT 6 Clicks Mobility   Outcome Measure  Help needed turning from your back to your side while in a flat bed without using bedrails?: A Little Help needed moving from lying on your back to sitting on the side of a flat bed without using bedrails?: A Little Help needed moving to and from a bed to a chair (including a wheelchair)?: A Lot Help needed standing up from a chair using your arms (e.g., wheelchair or bedside chair)?: A Lot Help needed to walk in hospital room?: Total Help needed climbing 3-5 steps with a railing? : Total 6 Click Score: 12    End of Session Equipment Utilized During Treatment: Gait belt Activity Tolerance: Patient tolerated treatment well Patient left: with call bell/phone within reach;in chair;with chair alarm set Nurse Communication: Mobility status PT Visit Diagnosis: Muscle weakness (generalized) (M62.81);Difficulty in walking, not elsewhere classified (R26.2)     Time: 8569-8554 PT Time Calculation (min) (ACUTE ONLY): 15 min  Charges:    $Gait Training: 8-22 mins PT General Charges $$ ACUTE PT VISIT: 1 Visit                     Luther Newhouse M,PT Acute Rehab Services [phone]    Stephane JULIANNA Bevel 05/10/2024, 4:22 PM

## 2024-05-11 LAB — CBC
HCT: 31.7 % — ABNORMAL LOW (ref 36.0–46.0)
Hemoglobin: 9.7 g/dL — ABNORMAL LOW (ref 12.0–15.0)
MCH: 25.1 pg — ABNORMAL LOW (ref 26.0–34.0)
MCHC: 30.6 g/dL (ref 30.0–36.0)
MCV: 82.1 fL (ref 80.0–100.0)
Platelets: 438 K/uL — ABNORMAL HIGH (ref 150–400)
RBC: 3.86 MIL/uL — ABNORMAL LOW (ref 3.87–5.11)
RDW: 16.1 % — ABNORMAL HIGH (ref 11.5–15.5)
WBC: 13.4 K/uL — ABNORMAL HIGH (ref 4.0–10.5)
nRBC: 0 % (ref 0.0–0.2)

## 2024-05-11 LAB — BASIC METABOLIC PANEL WITH GFR
Anion gap: 8 (ref 5–15)
BUN: 26 mg/dL — ABNORMAL HIGH (ref 8–23)
CO2: 27 mmol/L (ref 22–32)
Calcium: 9.1 mg/dL (ref 8.9–10.3)
Chloride: 105 mmol/L (ref 98–111)
Creatinine, Ser: 1.28 mg/dL — ABNORMAL HIGH (ref 0.44–1.00)
GFR, Estimated: 45 mL/min — ABNORMAL LOW (ref >60)
Glucose, Bld: 151 mg/dL — ABNORMAL HIGH (ref 70–99)
Potassium: 4.2 mmol/L (ref 3.5–5.1)
Sodium: 140 mmol/L (ref 135–145)

## 2024-05-11 LAB — GLUCOSE, CAPILLARY
Glucose-Capillary: 148 mg/dL — ABNORMAL HIGH (ref 70–99)
Glucose-Capillary: 222 mg/dL — ABNORMAL HIGH (ref 70–99)
Glucose-Capillary: 169 mg/dL — ABNORMAL HIGH (ref 70–99)
Glucose-Capillary: 138 mg/dL — ABNORMAL HIGH (ref 70–99)

## 2024-05-11 MED ORDER — BISOPROLOL FUMARATE 5 MG PO TABS
2.5000 mg | ORAL_TABLET | Freq: Every day | ORAL | Status: DC
  Administered 2024-05-11 – 2024-05-13 (×4): 2.5 mg via ORAL
  Filled 2024-05-11 (×3): qty 0.5

## 2024-05-11 MED ORDER — INSULIN ASPART 100 UNIT/ML IJ SOLN
0.0000 [IU] | Freq: Three times a day (TID) | INTRAMUSCULAR | Status: DC
  Administered 2024-05-11: 3 [IU] via SUBCUTANEOUS
  Administered 2024-05-11: 5 [IU] via SUBCUTANEOUS
  Administered 2024-05-12 – 2024-05-13 (×6): 3 [IU] via SUBCUTANEOUS

## 2024-05-11 MED ORDER — SODIUM CHLORIDE 0.9 % IV SOLN
1.0000 g | INTRAVENOUS | Status: AC
  Administered 2024-05-11 – 2024-05-12 (×2): 1 g via INTRAVENOUS
  Filled 2024-05-11 (×2): qty 10

## 2024-05-11 MED ORDER — ACETAMINOPHEN 500 MG PO TABS
1000.0000 mg | ORAL_TABLET | Freq: Three times a day (TID) | ORAL | Status: DC
  Administered 2024-05-11 – 2024-05-12 (×5): 1000 mg via ORAL
  Filled 2024-05-11 (×7): qty 2

## 2024-05-11 MED ORDER — INSULIN ASPART 100 UNIT/ML IJ SOLN
5.0000 [IU] | Freq: Three times a day (TID) | INTRAMUSCULAR | Status: DC
  Administered 2024-05-11 – 2024-05-13 (×9): 5 [IU] via SUBCUTANEOUS

## 2024-05-11 NOTE — Plan of Care (Signed)

## 2024-05-11 NOTE — Progress Notes (Signed)
 HD#3 SUBJECTIVE:  Patient Summary:  [name] is a 73 y.o. female with PMH of HFrEF (04/2024 EF: 20-25%), CKD 4, COPD, HTN, T2DM, HLD, PAF on Eliquis , and R ankle fx s/p ORIF who presents following abnormal labs and shortness of breath and admitted for AKI on CKD in the setting of a UTI.  Overnight Events: None  Interim History: Pt seen and examined at the bedside this morning. She was in a pleasant mood, but complained of pain on the top of her feet bilaterally. She reported that this is unusual and had difficulty characterizing the pain. She does not have a pain regimen at home.   OBJECTIVE:  Vital Signs: Vitals:   05/10/24 2054 05/10/24 2358 05/11/24 0828 05/11/24 0855  BP: (!) 125/56 128/61 (!) 155/70   Pulse: 86 82 81 82  Resp: 18 16  18   Temp: 97.9 F (36.6 C) 99.4 F (37.4 C) 98 F (36.7 C)   TempSrc: Oral Axillary    SpO2: 97% 97% 100%   Weight:      Height:       Supplemental O2: Room Air SpO2: 100 %  Filed Weights   05/08/24 0500 05/09/24 0439 05/10/24 0618  Weight: 78.1 kg 75.3 kg 76.2 kg     Intake/Output Summary (Last 24 hours) at 05/11/2024 0936 Last data filed at 05/10/2024 2300 Gross per 24 hour  Intake --  Output 125 ml  Net -125 ml   Net IO Since Admission: 58.75 mL [05/11/24 0936]  Physical Exam: Const: Awake, alert in NAD HENT: Normocephalic, atraumatic, mucus membranes moist Card: RRR, No MRG, Minimal pitting edema on bilateral lower extremities Resp: LCTAB, no increased work of breathing Abd: Soft, NTND, Bsx4 Extremities: Tender to palpation bilaterally on the dorsal surface of foot. No open wounds or overt edema noted.    Patient Lines/Drains/Airways Status     Active Line/Drains/Airways     Name Placement date Placement time Site Days   Peripheral IV 05/07/24 Right Antecubital 05/07/24  --  Antecubital  3            Pertinent labs and imaging:      Latest Ref Rng & Units 05/11/2024    2:02 AM 05/10/2024    2:02 AM 05/09/2024     2:21 AM  CBC  WBC 4.0 - 10.5 K/uL 13.4  13.8  12.0   Hemoglobin 12.0 - 15.0 g/dL 9.7  9.6  9.7   Hematocrit 36.0 - 46.0 % 31.7  31.5  31.3   Platelets 150 - 400 K/uL 438  422  371        Latest Ref Rng & Units 05/11/2024    2:02 AM 05/10/2024    2:02 AM 05/09/2024    2:21 AM  CMP  Glucose 70 - 99 mg/dL 848  743  694   BUN 8 - 23 mg/dL 26  33  40   Creatinine 0.44 - 1.00 mg/dL 8.71  8.55  8.26   Sodium 135 - 145 mmol/L 140  145  139   Potassium 3.5 - 5.1 mmol/L 4.2  4.3  3.7   Chloride 98 - 111 mmol/L 105  106  102   CO2 22 - 32 mmol/L 27  28  27    Calcium  8.9 - 10.3 mg/dL 9.1  9.0  8.8   Total Protein 6.5 - 8.1 g/dL  6.2    Total Bilirubin 0.0 - 1.2 mg/dL  0.4    Alkaline Phos 38 - 126 U/L  92    AST 15 - 41 U/L  13    ALT 0 - 44 U/L  10      No results found.  ASSESSMENT/PLAN:  Assessment: Principal Problem:   Acute on chronic HFrEF (heart failure with reduced ejection fraction) (HCC) Active Problems:   AKI (acute kidney injury) (HCC)   Leukocytosis   Acute cystitis with hematuria   Plan:   ##Klebsiella Pneumoniae UTI #Resolved AKI on CKD 4 #Documented CKD 4 Urine culture was positive for ampicillin-resistant klebsiella pneumoniae, sensitive to CTX.   -Day 4/5 CTX.  -Scr improving, BMP otherwise stable, monitoring renal function -AM RFP -Pt with documented CKD 4 at baseline, however throughout this admission, GFR 35-45. Potentially new baseline, however would need to follow up outpatient for long term trend of renal function.   #Chronic Biventricular Heart Failure #BLE edema Last echo on 04/14/24 showed LVEF of 20-25% and mildly reduced right ventricular function. She has continued to have diastolic hypotension and mildly elevated pulse pressure, but otherwise hemodynamically stable, afebrile and oxygenating well on room air. Bedside ultrasound 8/6 demonstrated IVC collapsibility inconsistent with fluid overload.  -Monitor volume status -Strict I/Os -Daily weights   -With improvement of renal function, GDMT restarted yesterday with spironolactone  12.5 daily. Continue to monitor electrolytes and renal function.   #T2DM Most recent A1C on 03/25/24 was 7.5. Patient measures glucose at home with Dexcom. Takes home Ozempic , Farxiga , and Tresiba  5 units daily. Farxiga  held d/t UTI.  -Continue home sliding scale insulin  -Continue home Glargine (Semglee ) 5 units -Meal time coverage added today due to hyperglycemia in the afternoon/evening. Will hold off on adjusting glargine due to blood sugars ~130's over night.    #COPD Patient notes most of functional impairment is due to pain, but also reports increased dyspnea on exertion. Appeared stable on room air. - 2 L/min O2 via [place] if needed - Continue home umeclidinium-vilanterol (Anoro Elipta) 62.5-25 MCG/ACT 1 puff    #HLD Most recent LDL 12/28/23 was 122. -Continue home atorvastatin  (Lipitor ) 80 mg -Follow up with PCP   #Paroxysmal Atrial Flutter Stable. NSR on EKG.  -continue home apixaban  (Eliquis ) 5 mg   #Multiple lung nodules Noted incidentally on CT 04/2024. -CT w/o contrast scheduled on 08/11   Best Practice: Diet: Renal diet VTE: Place TED hose Start: 05/08/24 0754 Code: Full   Disposition planning: Therapy Recs: SNF,  DISPO: Anticipated discharge 2 days to Skilled nursing facility pending IV abx and placement in a SNF.   Signature:  Schuyler Myrna [facility]ol[facility]  9:36 AM, 05/11/2024  On Call pager [phone]

## 2024-05-12 DIAGNOSIS — B961 Klebsiella pneumoniae [K. pneumoniae] as the cause of diseases classified elsewhere: Secondary | ICD-10-CM

## 2024-05-12 DIAGNOSIS — E1122 Type 2 diabetes mellitus with diabetic chronic kidney disease: Secondary | ICD-10-CM

## 2024-05-12 DIAGNOSIS — E785 Hyperlipidemia, unspecified: Secondary | ICD-10-CM

## 2024-05-12 DIAGNOSIS — Z7984 Long term (current) use of oral hypoglycemic drugs: Secondary | ICD-10-CM

## 2024-05-12 DIAGNOSIS — I5082 Biventricular heart failure: Secondary | ICD-10-CM

## 2024-05-12 DIAGNOSIS — Z1611 Resistance to penicillins: Secondary | ICD-10-CM

## 2024-05-12 DIAGNOSIS — Z7951 Long term (current) use of inhaled steroids: Secondary | ICD-10-CM

## 2024-05-12 DIAGNOSIS — Z7985 Long-term (current) use of injectable non-insulin antidiabetic drugs: Secondary | ICD-10-CM

## 2024-05-12 DIAGNOSIS — J449 Chronic obstructive pulmonary disease, unspecified: Secondary | ICD-10-CM

## 2024-05-12 DIAGNOSIS — I4892 Unspecified atrial flutter: Secondary | ICD-10-CM

## 2024-05-12 DIAGNOSIS — Z794 Long term (current) use of insulin: Secondary | ICD-10-CM

## 2024-05-12 DIAGNOSIS — Z7901 Long term (current) use of anticoagulants: Secondary | ICD-10-CM

## 2024-05-12 DIAGNOSIS — N184 Chronic kidney disease, stage 4 (severe): Secondary | ICD-10-CM

## 2024-05-12 LAB — CBC
HCT: 31.3 % — ABNORMAL LOW (ref 36.0–46.0)
Hemoglobin: 9.6 g/dL — ABNORMAL LOW (ref 12.0–15.0)
MCH: 25.1 pg — ABNORMAL LOW (ref 26.0–34.0)
MCHC: 30.7 g/dL (ref 30.0–36.0)
MCV: 81.7 fL (ref 80.0–100.0)
Platelets: 437 K/uL — ABNORMAL HIGH (ref 150–400)
RBC: 3.83 MIL/uL — ABNORMAL LOW (ref 3.87–5.11)
RDW: 16.1 % — ABNORMAL HIGH (ref 11.5–15.5)
WBC: 11.3 K/uL — ABNORMAL HIGH (ref 4.0–10.5)
nRBC: 0 % (ref 0.0–0.2)

## 2024-05-12 LAB — BASIC METABOLIC PANEL WITH GFR
Anion gap: 8 (ref 5–15)
BUN: 24 mg/dL — ABNORMAL HIGH (ref 8–23)
CO2: 26 mmol/L (ref 22–32)
Calcium: 9 mg/dL (ref 8.9–10.3)
Chloride: 109 mmol/L (ref 98–111)
Creatinine, Ser: 1.16 mg/dL — ABNORMAL HIGH (ref 0.44–1.00)
GFR, Estimated: 51 mL/min — ABNORMAL LOW (ref >60)
Glucose, Bld: 130 mg/dL — ABNORMAL HIGH (ref 70–99)
Potassium: 4.2 mmol/L (ref 3.5–5.1)
Sodium: 143 mmol/L (ref 135–145)

## 2024-05-12 LAB — GLUCOSE, CAPILLARY
Glucose-Capillary: 119 mg/dL — ABNORMAL HIGH (ref 70–99)
Glucose-Capillary: 171 mg/dL — ABNORMAL HIGH (ref 70–99)
Glucose-Capillary: 157 mg/dL — ABNORMAL HIGH (ref 70–99)

## 2024-05-12 NOTE — TOC Progression Note (Signed)
 Transition of Care [facility]) - Progression Note    Patient Details  Name: [name] MRN: [id number] Date of Birth: 1951-07-22  [facility] CM/SW Contact  Gwenn Frieze [place], [place] Phone Number: 05/12/2024, 12:00 PM  Clinical Narrative: Home and Community/UHC auth for [facility] received: [id number], valid 8/9-8/12. Per MD, possible dc Monday. Notified Darrian in [employer]. SW will follow.   Frieze Gwenn, MSW, LCSW [phone] (coverage)      Expected Discharge Plan: Skilled Nursing Facility Barriers to Discharge: Continued Medical Work up, SNF Pending bed offer, Insurance Authorization               Expected Discharge Plan and Services       Living arrangements for the past 2 months: Apartment                                       Social Drivers of Health (SDOH) Interventions SDOH Screenings   Food Insecurity: No Food Insecurity (05/07/2024)  Housing: Low Risk  (05/07/2024)  Transportation Needs: No Transportation Needs (05/07/2024)  Utilities: Not At Risk (05/07/2024)  Alcohol Screen: Low Risk  (01/09/2024)  Depression (PHQ2-9): Low Risk  (05/03/2024)  Financial Resource Strain: Low Risk  (01/09/2024)  Physical Activity: Inactive (01/09/2024)  Social Connections: Moderately Integrated (05/07/2024)  Stress: No Stress Concern Present (01/09/2024)  Tobacco Use: High Risk (05/07/2024)  Health Literacy: Adequate Health Literacy (01/09/2024)    Readmission Risk Interventions    06/22/2022   11:43 AM  Readmission Risk Prevention Plan  Transportation Screening Complete  PCP or Specialist Appt within 5-7 Days Complete  Home Care Screening Complete  Medication Review (RN CM) Referral to Pharmacy

## 2024-05-12 NOTE — Discharge Instructions (Signed)
 You were hospitalized for painful leg swelling and abnormal labs. We were initially concerned your symptoms were related to your heart failure, but after a work up determined that you had a kidney injury, likely connected to dehydration and a urinary tract infection. We treated you with antibiotics and made some changes to your medications, which helped to improve your UTI, your kidney function and your symptoms. Thank you for allowing us  to be part of your care.   We arranged for you to follow up at:  Cardiology on 05/21/2024 at: 10:20am (please arrive by 10:00am)  [address]  [place] [address] Please call [phone] to make an appointment with your primary care provider upon discharge from the rehabilitation skilled nursing.   Please note these changes made to your medications:   *Please START taking:   1. Bisoprolol  2.5 mg once daily (new dosage)  2. Spironolactone  12.5 mg once daily (new dosage) *Please CONTINUE taking:   1. Apixaban  (Eliquis ) 5 mg two times daily  2. Atorvastatin  (Lipitor ) 80 mg once daily  3. Insulin  Degludec (Tresiba ) 5 units daily  4. Ozempic  1 mg inject weekly   5. Anoro Ellipta  1 puff daily *Please STOP taking:   1. Dapagliflozin  propanediol (Farxiga ) 10 mg until you see your primary care provider  2. Torsemide  (Demadex ) 40 mg until you see your primary care provider  We arranged for you to follow up at:   Contact information for follow-up providers     Lesia Ozell Barter, PA-C Follow up on 05/21/2024.   Specialty: Cardiology Why: At 10:20 AM Contact information: [address] [place] [address] [phone]         [name], MD. Schedule an appointment as soon as possible for a visit.   Specialty: Internal Medicine Why: Please call and make and appointment upon discharge from the rehabiliation skilled nursing facility. Contact information: [address], [address] [place] [address] [phone]               Contact information for after-discharge care     Destination     [facility] .   Service: Skilled Nursing Contact information: [address] [place]  [address] [phone]                      For questions about your care plan, until you are able to see your primary doctor: Call [phone]. Dial [phone] for the operator. Ask for the internal medicine resident on call.  Thank you for allowing us  to be part of your care.   Doyal Miyamoto, MD 05/13/2024, 10:59 AM

## 2024-05-12 NOTE — Plan of Care (Signed)

## 2024-05-12 NOTE — Progress Notes (Signed)
 HD#4 SUBJECTIVE:  Patient Summary: [name] is a 73 y.o. female with PMH of HFrEF (04/2024 EF: 20-25%), CKD 4, COPD, HTN, T2DM, HLD, PAF on Eliquis , and R ankle fx s/p ORIF who presents following abnormal labs and shortness of breath and admitted for AKI on CKD in the setting of a UTI.   Overnight Events: None  Interim History: [name] reports feeling well this morning and ready to leave the hospital for the SNF. She continues to deny any UTI symptoms and had no questions or new complaints at this time.  OBJECTIVE:  Vital Signs: Vitals:   05/11/24 2331 05/11/24 2333 05/12/24 0444 05/12/24 0500  BP: (!) 147/100 (!) 143/63 (!) 150/80   Pulse: 80 79 80   Resp: 18 18 18    Temp: 97.7 F (36.5 C)  98.2 F (36.8 C)   TempSrc: Oral     SpO2: 100% 100% 94%   Weight:    75.8 kg  Height:       Supplemental O2: Room Air SpO2: 94 %  Filed Weights   05/10/24 0618 05/11/24 0706 05/12/24 0500  Weight: 76.2 kg 76 kg 75.8 kg     Intake/Output Summary (Last 24 hours) at 05/12/2024 0640 Last data filed at 05/11/2024 1500 Gross per 24 hour  Intake 840 ml  Output 100 ml  Net 740 ml   Net IO Since Admission: 798.75 mL [05/12/24 0640]  Physical Exam: Const: Awake, alert in NAD HENT: Normocephalic, atraumatic, mucus membranes moist Card: RRR, No MRG, Minimal pitting edema on bilateral lower extremities Resp: LCTAB, no increased work of breathing Extremities: No tenderness to palpation BLE. No open wounds or overt edema noted.   Patient Lines/Drains/Airways Status     Active Line/Drains/Airways     Name Placement date Placement time Site Days   Peripheral IV 05/10/24 22 G 1.75 Anterior;Left Forearm 05/10/24  1620  Forearm  2            Pertinent labs and imaging:      Latest Ref Rng & Units 05/12/2024    5:42 AM 05/11/2024    2:02 AM 05/10/2024    2:02 AM  CBC  WBC 4.0 - 10.5 K/uL 11.3  13.4  13.8   Hemoglobin 12.0 - 15.0 g/dL 9.6  9.7  9.6   Hematocrit 36.0 - 46.0 %  31.3  31.7  31.5   Platelets 150 - 400 K/uL 437  438  422        Latest Ref Rng & Units 05/11/2024    2:02 AM 05/10/2024    2:02 AM 05/09/2024    2:21 AM  CMP  Glucose 70 - 99 mg/dL 848  743  694   BUN 8 - 23 mg/dL 26  33  40   Creatinine 0.44 - 1.00 mg/dL 8.71  8.55  8.26   Sodium 135 - 145 mmol/L 140  145  139   Potassium 3.5 - 5.1 mmol/L 4.2  4.3  3.7   Chloride 98 - 111 mmol/L 105  106  102   CO2 22 - 32 mmol/L 27  28  27    Calcium  8.9 - 10.3 mg/dL 9.1  9.0  8.8   Total Protein 6.5 - 8.1 g/dL  6.2    Total Bilirubin 0.0 - 1.2 mg/dL  0.4    Alkaline Phos 38 - 126 U/L  92    AST 15 - 41 U/L  13    ALT 0 - 44 U/L  10  No results found.  ASSESSMENT/PLAN:  Assessment: Principal Problem:   Acute on chronic HFrEF (heart failure with reduced ejection fraction) (HCC) Active Problems:   AKI (acute kidney injury) (HCC)   Leukocytosis   Acute cystitis with hematuria   Plan: ##Klebsiella Pneumoniae UTI #Resolved AKI on CKD 4 #Documented CKD 4 Urine culture was positive for ampicillin-resistant klebsiella pneumoniae, sensitive to CTX.   -Day 5/5 CTX.  -Scr improving, BMP otherwise stable, monitoring renal function -CBC showed improving leukocytosis -AM CBC, RFP -Pt with documented CKD 4 at baseline, however throughout this admission, GFR 45-55. Potentially new baseline, however would need to follow up outpatient for long term trend of renal function.    #Chronic Biventricular Heart Failure #BLE edema Last echo on 04/14/24 showed LVEF of 20-25% and mildly reduced right ventricular function. She has continued to have diastolic hypotension and mildly elevated pulse pressure, but otherwise hemodynamically stable, afebrile and oxygenating well on room air. Bedside ultrasound 8/6 demonstrated IVC collapsibility inconsistent with fluid overload.  -Bisoprolol  (Zebeta ) 2.5 mg -Spironolactone  12.5 mg -Monitor volume status -Strict I/Os -Daily weights  -Continue to monitor  electrolytes and renal function.  -Plan for torsemide  20 mg PRN after d/c   #T2DM Most recent A1C on 03/25/24 was 7.5. Patient measures glucose at home with Dexcom. Takes home Ozempic , Farxiga , and Tresiba  5 units daily. Farxiga  held d/t UTI.  -Continue home sliding scale insulin  -Continue home Glargine (Semglee ) 5 units -Meal time coverage added today due to hyperglycemia in the afternoon/evening. Will hold off on adjusting glargine due to blood sugars ~130's over night.  -Recommend Ozempic  administration at SNF.   #COPD Patient notes most of functional impairment is due to pain, but also reports increased dyspnea on exertion. Appeared stable on room air. - 2 L/min O2 via  if needed - Continue home umeclidinium-vilanterol (Anoro Elipta) 62.5-25 MCG/ACT 1 puff    #HLD Most recent LDL 12/28/23 was 122. -Continue home atorvastatin  (Lipitor ) 80 mg -Follow up with PCP   #Paroxysmal Atrial Flutter Stable. NSR on EKG.  -continue home apixaban  (Eliquis ) 5 mg   #Multiple lung nodules Noted incidentally on CT 04/2024. -CT w/o contrast scheduled on 08/11  Best Practice: Diet: Renal diet VTE: Place TED hose Start: 05/08/24 0754 Code: Full  Disposition planning: Therapy Recs: None, DME: none DISPO: Anticipated discharge tomorrow to Skilled nursing facility pending placement.  Signature:  Nunzio DELENA Bolt Verma [name]  6:40 AM, 05/12/2024  On Call pager [phone]

## 2024-05-13 ENCOUNTER — Ambulatory Visit: Payer: 59

## 2024-05-13 ENCOUNTER — Encounter: Payer: Self-pay | Admitting: Internal Medicine

## 2024-05-13 ENCOUNTER — Ambulatory Visit (HOSPITAL_COMMUNITY)

## 2024-05-13 DIAGNOSIS — N179 Acute kidney failure, unspecified: Secondary | ICD-10-CM | POA: Diagnosis not present

## 2024-05-13 DIAGNOSIS — R2681 Unsteadiness on feet: Secondary | ICD-10-CM | POA: Diagnosis not present

## 2024-05-13 DIAGNOSIS — E1129 Type 2 diabetes mellitus with other diabetic kidney complication: Secondary | ICD-10-CM | POA: Diagnosis not present

## 2024-05-13 DIAGNOSIS — R918 Other nonspecific abnormal finding of lung field: Secondary | ICD-10-CM | POA: Diagnosis not present

## 2024-05-13 DIAGNOSIS — N1832 Chronic kidney disease, stage 3b: Secondary | ICD-10-CM | POA: Diagnosis not present

## 2024-05-13 DIAGNOSIS — I509 Heart failure, unspecified: Secondary | ICD-10-CM | POA: Diagnosis not present

## 2024-05-13 DIAGNOSIS — I10 Essential (primary) hypertension: Secondary | ICD-10-CM | POA: Diagnosis not present

## 2024-05-13 DIAGNOSIS — I5022 Chronic systolic (congestive) heart failure: Secondary | ICD-10-CM | POA: Diagnosis not present

## 2024-05-13 DIAGNOSIS — M6281 Muscle weakness (generalized): Secondary | ICD-10-CM | POA: Diagnosis not present

## 2024-05-13 DIAGNOSIS — N390 Urinary tract infection, site not specified: Secondary | ICD-10-CM | POA: Diagnosis not present

## 2024-05-13 DIAGNOSIS — J449 Chronic obstructive pulmonary disease, unspecified: Secondary | ICD-10-CM | POA: Diagnosis not present

## 2024-05-13 DIAGNOSIS — N3001 Acute cystitis with hematuria: Secondary | ICD-10-CM | POA: Diagnosis not present

## 2024-05-13 DIAGNOSIS — I4892 Unspecified atrial flutter: Secondary | ICD-10-CM | POA: Diagnosis not present

## 2024-05-13 DIAGNOSIS — Z95810 Presence of automatic (implantable) cardiac defibrillator: Secondary | ICD-10-CM | POA: Diagnosis not present

## 2024-05-13 DIAGNOSIS — E1122 Type 2 diabetes mellitus with diabetic chronic kidney disease: Secondary | ICD-10-CM | POA: Diagnosis not present

## 2024-05-13 LAB — BASIC METABOLIC PANEL WITH GFR
Anion gap: 7 (ref 5–15)
BUN: 28 mg/dL — ABNORMAL HIGH (ref 8–23)
CO2: 27 mmol/L (ref 22–32)
Calcium: 8.8 mg/dL — ABNORMAL LOW (ref 8.9–10.3)
Chloride: 106 mmol/L (ref 98–111)
Creatinine, Ser: 1.4 mg/dL — ABNORMAL HIGH (ref 0.44–1.00)
GFR, Estimated: 40 mL/min — ABNORMAL LOW (ref >60)
Glucose, Bld: 155 mg/dL — ABNORMAL HIGH (ref 70–99)
Potassium: 4.2 mmol/L (ref 3.5–5.1)
Sodium: 140 mmol/L (ref 135–145)

## 2024-05-13 LAB — CBC WITH DIFFERENTIAL/PLATELET
Abs Immature Granulocytes: 0.22 K/uL — ABNORMAL HIGH (ref 0.00–0.07)
Basophils Absolute: 0.1 K/uL (ref 0.0–0.1)
Basophils Relative: 1 %
Eosinophils Absolute: 0.5 K/uL (ref 0.0–0.5)
Eosinophils Relative: 5 %
HCT: 31.4 % — ABNORMAL LOW (ref 36.0–46.0)
Hemoglobin: 9.7 g/dL — ABNORMAL LOW (ref 12.0–15.0)
Immature Granulocytes: 2 %
Lymphocytes Relative: 17 %
Lymphs Abs: 2 K/uL (ref 0.7–4.0)
MCH: 25.4 pg — ABNORMAL LOW (ref 26.0–34.0)
MCHC: 30.9 g/dL (ref 30.0–36.0)
MCV: 82.2 fL (ref 80.0–100.0)
Monocytes Absolute: 0.8 K/uL (ref 0.1–1.0)
Monocytes Relative: 7 %
Neutro Abs: 7.9 K/uL — ABNORMAL HIGH (ref 1.7–7.7)
Neutrophils Relative %: 68 %
Platelets: 429 K/uL — ABNORMAL HIGH (ref 150–400)
RBC: 3.82 MIL/uL — ABNORMAL LOW (ref 3.87–5.11)
RDW: 16.4 % — ABNORMAL HIGH (ref 11.5–15.5)
WBC: 11.5 K/uL — ABNORMAL HIGH (ref 4.0–10.5)
nRBC: 0 % (ref 0.0–0.2)

## 2024-05-13 LAB — CBC
HCT: 31.5 % — ABNORMAL LOW (ref 36.0–46.0)
Hemoglobin: 9.7 g/dL — ABNORMAL LOW (ref 12.0–15.0)
MCH: 25.2 pg — ABNORMAL LOW (ref 26.0–34.0)
MCHC: 30.8 g/dL (ref 30.0–36.0)
MCV: 81.8 fL (ref 80.0–100.0)
Platelets: 421 K/uL — ABNORMAL HIGH (ref 150–400)
RBC: 3.85 MIL/uL — ABNORMAL LOW (ref 3.87–5.11)
RDW: 16.3 % — ABNORMAL HIGH (ref 11.5–15.5)
WBC: 11.5 K/uL — ABNORMAL HIGH (ref 4.0–10.5)
nRBC: 0 % (ref 0.0–0.2)

## 2024-05-13 LAB — GLUCOSE, CAPILLARY
Glucose-Capillary: 155 mg/dL — ABNORMAL HIGH (ref 70–99)
Glucose-Capillary: 164 mg/dL — ABNORMAL HIGH (ref 70–99)

## 2024-05-13 MED ORDER — SENNOSIDES-DOCUSATE SODIUM 8.6-50 MG PO TABS: 1.0000 | ORAL_TABLET | Freq: Every evening | ORAL | Status: DC | PRN

## 2024-05-13 MED ORDER — ACETAMINOPHEN 500 MG PO TABS: 1000.0000 mg | ORAL_TABLET | Freq: Three times a day (TID) | ORAL | Status: DC | PRN

## 2024-05-13 MED ORDER — ACETAMINOPHEN 500 MG PO TABS
1000.0000 mg | ORAL_TABLET | Freq: Three times a day (TID) | ORAL | Status: DC | PRN
  Administered 2024-05-13 (×2): 1000 mg via ORAL
  Filled 2024-05-13: qty 2

## 2024-05-13 NOTE — Progress Notes (Addendum)
 Occupational Therapy Treatment Patient Details Name: [name] MRN: [id number] DOB: Mar 24, 1951 Today's Date: 05/13/2024   History of present illness Pt is a 73 y/o female presenting to the ED with shortness of breath and admitted for AKI on CKD in the setting of acute on chronic heart failure on 05/07/24. PMH significant for COPD Gold stage II with chronic hypoxic respite failure on nocturnal nasal cannula, chronic combined HFpEF and HFrEF, LVEF 20-25%, status post AICD, IIDM, cigarette smoker, HTN, HLD, lipoma, depression.   OT comments  Pt making progress with functional goals. Pt min A to sit EOB, mod A to don socks, max/mod A to stand and step pivot to [facility]. Pt required mod A with toileting tasks, mod A to SPT back to bed. Pt sat EOB and participated in grooming and UB ADLs with set up. OT will follow acutely to maximize level of function and safety      If plan is discharge home, recommend the following:  Two people to help with walking and/or transfers;A lot of help with bathing/dressing/bathroom;Assist for transportation;Help with stairs or ramp for entrance   Equipment Recommendations  Other (comment) (defer)    Recommendations for Other Services      Precautions / Restrictions Precautions Precautions: Fall Recall of Precautions/Restrictions: Intact Precaution/Restrictions Comments: monitor O2 (only wears at night) Restrictions Weight Bearing Restrictions Per Provider Order: No       Mobility Bed Mobility Overal bed mobility: Needs Assistance Bed Mobility: Supine to Sit, Sit to Supine     Supine to sit: Min assist Sit to supine: Min assist   General bed mobility comments: min A to elevate trunk and manage LEs    Transfers Overall transfer level: Needs assistance Equipment used: Rolling walker (2 wheels) Transfers: Sit to/from Stand, Bed to chair/wheelchair/BSC Sit to Stand: Max assist, Mod assist Stand pivot transfers: Mod assist         General transfer  comment: Pt initially with posterior lean needing incr time to get balance.     Balance Overall balance assessment: Needs assistance Sitting-balance support: Feet supported, No upper extremity supported Sitting balance-Leahy Scale: Fair     Standing balance support: Reliant on assistive device for balance, During functional activity, Bilateral upper extremity supported Standing balance-Leahy Scale: Poor                             ADL either performed or assessed with clinical judgement   ADL Overall ADL's : Needs assistance/impaired     Grooming: Wash/dry hands;Wash/dry face;Supervision/safety;Sitting           Upper Body Dressing : Set up;Sitting   Lower Body Dressing: Moderate assistance;Sitting/lateral leans;Sit to/from stand Lower Body Dressing Details (indicate cue type and reason): to don socks Toilet Transfer: Maximal assistance;Moderate assistance;Cueing for safety;Rolling walker (2 wheels);BSC/3in1   Toileting- Clothing Manipulation and Hygiene: Maximal assistance;Moderate assistance;Sit to/from stand       Functional mobility during ADLs: Maximal assistance;Moderate assistance;Cueing for safety;Rolling walker (2 wheels)      Extremity/Trunk Assessment Upper Extremity Assessment Upper Extremity Assessment: Generalized weakness   Lower Extremity Assessment Lower Extremity Assessment: Defer to PT evaluation        Vision Ability to See in Adequate Light: 0 Adequate Patient Visual Report: No change from baseline     Perception     Praxis     Communication Communication Communication: No apparent difficulties   Cognition Arousal: Alert Behavior During Therapy: [facility] for tasks  assessed/performed Cognition: No apparent impairments                               Following commands: Intact        Cueing   Cueing Techniques: Verbal cues  Exercises      Shoulder Instructions       General Comments      Pertinent  Vitals/ Pain       Pain Assessment Pain Assessment: Faces Faces Pain Scale: Hurts little more Pain Location: B LEs/ankles Pain Descriptors / Indicators: Grimacing, Guarding, Tender Pain Intervention(s): Limited activity within patient's tolerance, Monitored during session, Repositioned  Home Living                                          Prior Functioning/Environment              Frequency  Min 2X/week        Progress Toward Goals  OT Goals(current goals can now be found in the care plan section)  Progress towards OT goals: Progressing toward goals     Plan      Co-evaluation                 AM-PAC OT 6 Clicks Daily Activity     Outcome Measure   Help from another person eating meals?: None Help from another person taking care of personal grooming?: A Little Help from another person toileting, which includes using toliet, bedpan, or urinal?: A Lot Help from another person bathing (including washing, rinsing, drying)?: A Lot Help from another person to put on and taking off regular upper body clothing?: A Lot Help from another person to put on and taking off regular lower body clothing?: A Lot 6 Click Score: 15    End of Session Equipment Utilized During Treatment: Gait belt;Rolling walker (2 wheels);Other (comment) (BSC)  OT Visit Diagnosis: Other abnormalities of gait and mobility (R26.89);Muscle weakness (generalized) (M62.81);Pain Pain - Right/Left:  (bilaterally) Pain - part of body: Ankle and joints of foot;Leg   Activity Tolerance Patient limited by fatigue   Patient Left in bed;with call bell/phone within reach;with bed alarm set   Nurse Communication Mobility status        Time: 8757-8740 OT Time Calculation (min): 17 min  Charges: OT Visit OT Treatments $Self Care/Home Management : 8-22 mins    Jacques Karna Loose 05/13/2024, 2:25 PM

## 2024-05-13 NOTE — TOC Transition Note (Signed)
 Transition of Care [facility]) - Discharge Note   Patient Details  Name: [name] MRN: [id number] Date of Birth: 1951/01/21  Transition of Care [facility]) CM/SW Contact:  Bridget Cordella Simmonds, LCSW Phone Number: 05/13/2024, 1:06 PM   Clinical Narrative:   Pt discharging to [facility], room 503.  RN call report to [phone].  PTAR called [phone].   1000: CSW confirmed with [facility] that they can receive pt today.     Final next level of care: Skilled Nursing Facility Barriers to Discharge: Barriers Resolved   Patient Goals and CMS Choice            Discharge Placement              Patient chooses bed at: [facility] Patient to be transferred to facility by: ptar Name of family member notified: daughter [name] Patient and family notified of of transfer: 05/13/24  Discharge Plan and Services Additional resources added to the After Visit Summary for                                       Social Drivers of Health (SDOH) Interventions SDOH Screenings   Food Insecurity: No Food Insecurity (05/07/2024)  Housing: Low Risk  (05/07/2024)  Transportation Needs: No Transportation Needs (05/07/2024)  Utilities: Not At Risk (05/07/2024)  Alcohol Screen: Low Risk  (01/09/2024)  Depression (PHQ2-9): Low Risk  (05/03/2024)  Financial Resource Strain: Low Risk  (01/09/2024)  Physical Activity: Inactive (01/09/2024)  Social Connections: Moderately Integrated (05/07/2024)  Stress: No Stress Concern Present (01/09/2024)  Tobacco Use: High Risk (05/07/2024)  Health Literacy: Adequate Health Literacy (01/09/2024)     Readmission Risk Interventions    06/22/2022   11:43 AM  Readmission Risk Prevention Plan  Transportation Screening Complete  PCP or Specialist Appt within 5-7 Days Complete  Home Care Screening Complete  Medication Review (RN CM) Referral to Pharmacy

## 2024-05-13 NOTE — Discharge Summary (Signed)
 Name: [name] MRN: [id number] DOB: 03/08/1951 73 y.o. PCP: Peggy Homans, MD  Date of Admission: 05/07/2024 12:34 PM Date of Discharge: 05/13/2024 Attending Physician: Dr. MICAEL Riis Winfrey  Discharge Diagnosis: 1. Principal Problem:   Acute on chronic HFrEF (heart failure with reduced ejection fraction) (HCC) Active Problems:   AKI (acute kidney injury) (HCC)   Leukocytosis   Acute cystitis with hematuria   Discharge Medications: Allergies as of 05/13/2024       Reactions   Actos [pioglitazone] Other (See Comments)   congestive heart failure   Naproxen  Other (See Comments)   500mg  dose made her sleep for two days   Rosiglitazone Hives   Metformin  And Related    Caused kidney problems    Tomato Itching   Hydrocodone -acetaminophen  Itching   Hydrocortisone Hives, Itching        Medication List     PAUSE taking these medications    dapagliflozin  propanediol 10 MG Tabs tablet Wait to take this until your doctor or other care provider tells you to start again. Commonly known as: Farxiga  Take 1 tablet (10 mg total) by mouth daily.       STOP taking these medications    Lokelma  10 g Pack packet Generic drug: sodium zirconium cyclosilicate    torsemide  20 MG tablet Commonly known as: DEMADEX        TAKE these medications    acetaminophen  500 MG tablet Commonly known as: TYLENOL  Take 2 tablets (1,000 mg total) by mouth every 8 (eight) hours as needed for mild pain (pain score 1-3).   albuterol  108 (90 Base) MCG/ACT inhaler Commonly known as: VENTOLIN  HFA Inhale 2 puffs into the lungs every 6 (six) hours as needed for wheezing or shortness of breath.   Anoro Ellipta  62.5-25 MCG/ACT Aepb Generic drug: umeclidinium-vilanterol INHALE 1 PUFF INTO THE LUNGS DAILY   apixaban  5 MG Tabs tablet Commonly known as: ELIQUIS  Take 1 tablet (5 mg total) by mouth 2 (two) times daily.   atorvastatin  80 MG tablet Commonly known as: LIPITOR  Take 1 tablet (80 mg  total) by mouth at bedtime.   bisoprolol  5 MG tablet Commonly known as: ZEBETA  Take 0.5 tablets (2.5 mg total) by mouth daily.   calcium -vitamin D  500-200 MG-UNIT tablet Commonly known as: OSCAL WITH D Take 1 tablet by mouth 2 (two) times daily.   Dexcom G7 Sensor Misc Inject 1 Application into the skin. Every 10 days   insulin  degludec 100 UNIT/ML FlexTouch Pen Commonly known as: TRESIBA  Inject 5 Units into the skin daily.   ketoconazole  2 % shampoo Commonly known as: NIZORAL  Apply 1 Application topically once a week.   levocetirizine 5 MG tablet Commonly known as: XYZAL  Take 1 tablet (5 mg total) by mouth at bedtime.   OXYGEN  Inhale 2 L into the lungs at bedtime.   Ozempic  (1 MG/DOSE) 4 MG/3ML Sopn Generic drug: Semaglutide  (1 MG/DOSE) Inject 1 mg into the skin once a week. Takes on Saturdays   senna-docusate 8.6-50 MG tablet Commonly known as: Senokot-S Take 1 tablet by mouth at bedtime as needed for mild constipation.   spironolactone  25 MG tablet Commonly known as: ALDACTONE  Take 0.5 tablets (12.5 mg total) by mouth daily.   triamcinolone  ointment 0.1 % Commonly known as: KENALOG  Apply 1 Application topically 2 (two) times daily. Mix with Eucerin cream        Disposition and follow-up:   [name] was discharged from [facility] in Good condition.  At the  hospital follow up visit please address:  [  ] 1.  Patient treated for UTI and AKI on CKD. Farxiga  was held during admission. Will need determination in outpatient setting if appropriate to resume.   [  ] 2.  Patient's Spironolactone  and Bisoprolol  dosage changed to Bisoprolol  2.5 mg daily and Spironolactone  12.5 mg daily. Please ensure she is taking this appropriately.   [  ] 3.  Torsemide  has been held on discharge and would recommend that this not be a scheduled dose, but prn as needed.   [  ] 4.  Labs / imaging needed at time of follow-up:  [  ] BMP in 1 week to monitor Cr  (baseline ~1.5) and electrolytes.  [  ] Please monitor BP.  [  ] CBC in 1 week for Hgb and WBC recheck.  [  ] LDL with PCP.   [  ] 5.  Patient was scheduled for outpatient CT to monitor lung nodules. This was canceled due to appointment conflicting with hospital discharge and will need to be rescheduled.   6.  Pending labs/ test needing follow-up: None  Follow-up Appointments:  Contact information for follow-up providers     Lesia Ozell Barter, PA-C Follow up on 05/21/2024.   Specialty: Cardiology Why: At 10:20 AM Contact information: [address] [place] [address] [phone]         Peggy Homans, MD. Schedule an appointment as soon as possible for a visit.   Specialty: Internal Medicine Why: Please call and make and appointment upon discharge from the rehabiliation skilled nursing facility. Contact information: [address], [address] [place] [address] [phone]              Contact information for after-discharge care     Destination     [facility] .   Service: Skilled Nursing Contact information: [address] [place]  [address] [phone]                      Hospital Course by problem list: [name] is a 73 y.o. female with PMH of HFrEF (04/2024 EF: 20-25%), CKD 4, COPD, HTN, T2DM, HLD, PAF on Eliquis , and R ankle fx s/p ORIF who presented following abnormal labs and admitted for AKI on CKD in the setting of a UTI, now being discharged on hospital day 5 with the following pertinent hospital course:  #AKI on CKD 4 #UTI [name] presented to the ED over abnormal labs with concern for acute on chronic heart failure.  She was previously seen on 7/16 after an episode of presyncope preceded by a week of shortness of breath. She believed the episode was due to overheating. At that time, there was no evidence of MI, CHF exacerbation, COPD exacerbation or DVT/PE. CXR  showed a cluster of micronodules with some concern for infection but was not worked up due to absence of fever, cough or leukocytosis. A f/u CT was scheduled for 8/11. Echo showed EF of 20-25%. She was found to have a prerenal AKI likely due to diuretics and decreased oral intake, which improved after discontinuation of torsemide . Shortness of breath and leg swelling had also improved prior to discharge. She did not resume the torsemide  following discharge.    She followed up with PCP on 7/21, and BMP showed hyperkalemia. She was advised to hold her potassium supplements and spironolactone  and restart her torsemide . However, on 7/23 a repeat BMP found persistent  hyperkalemia and elevated Scr. She was advised to stop the torsemide  and was given 3 doses of Lokelma . She was supposed to follow up for repeat labs, though did not have this completed until she presented to [facility] clinic on 8/4.    She was seen again in [facility] on 8/4 with the complaint of worsening ankle swelling in the setting of stopping diuretic therapy. BMP showed hypokalemia and elevated Scr at 3.30 (BL 1.81). Cardiology saw her on the same day and noted HFrEF was stable, chronic LE edema has a component of venous stasis, and recommended restarting torsemide  MWF.    It was determined her AKI was likely a result of hypoperfusion from fluid overload. She was directly admitted to the hospital for IV diuresis. On presentation for this current hospitalization, she reported increased difficulty walking due to swelling and pain on bilateral lower extremities and worsening shortness of breath with exertion. CRP on 8/4 was elevated at 22.7. She reported intermittent palpitations, chills, decreased appetite, thirst, fluid intake and decreased urine output with some urgency. She denied dysuria or hematuria. Labs were significant for leukocytosis and Scr elevation consistent with acute on chronic AKI.   Spironolactone , bisoprolol , Farxiga  and torsemide  were  held. Physical exam showed some take bilateral non-pitting pedal edema, but was otherwise inconsistent with fluid overload. Bedside ultrasound was further reassuring against acute on chronic heart failure. A urinalysis was consistent with a UTI, and urine culture grew Klebsiella pneumoniae colonies. Renal US  was unremarkable. She was treated with a 5 day course of IV ceftriaxone . She remained afebrile and otherwise asymptomatic. Scr continued to improve well below her previously established baseline, lastly at 1.16 . Spironolactone  and bisoprolol  were added as tolerated given improved renal function. Leukocytosis improved some after antibiotic regimen, but has not resolved as of 08/11.  -Continue to monitor Scr and consider CKD reevaluation given improved GFR. She may be CKD3b.  -Repeat CBC to monitor for resolution of leukocytosis -Hold torsemide  40 mg, restart as outpatient pending adequate oral intake -Hold Farxiga  10 mg, restart as outpatient pending adequate oral intake and resolution of leukocytosis  #Chronic Biventricular Heart Failure #BLE edema Last echo on 04/14/24 showed LVEF of 20-25% and mildly reduced right ventricular function. She remained afebrile with some diastolic hypotension but otherwise hemodynamically stable and oxygenating well on room air. Patient appeared warm and wet on physical exam with mild lower extremity tender non-pitting edema mostly notable around the ankles. Lungs were clear to auscultation. Bedside ultrasound demonstrated IVC collapsibility inconsistent with fluid overload.  Bisoprolol  and spironolactone  were initially held due to concern for acute on chronic CHF and AKI, but reintroduced with improving Scr. - f/u outpatient with Cardiology  -Hold torsemide  40 mg, restart as outpatient pending adequate oral intake  #COPD Patient notes most of functional impairment is due to pain, but also reports increased dyspnea on exertion. Appeared stable on room air. She was  continued on umeclidinium-vilanterol (Anoro Elipta) 62.5-25 MCG/ACT 1 puff    #T2DM Most recent A1C on 03/25/24 was 7.5. Patient measures glucose at home with Dexcom. Takes home Ozempic , Farxiga , and Tresiba  5 units daily. Last dose of Ozempic  was administered 8/3. She was initially maintained on SSI, but blood sugars were elevated. They were well controlled following addition of Glargine 5 units.  - Resume Tresiba  5 units daily  - Restart Ozempic  1 mg weekly - Hold Farxiga  10 mg, restart as outpatient pending adequate oral intake and resolution of leukocytosis   #HLD Most recent LDL 12/28/23 was  122. She was continued on home Lipitor . -Follow up LDL with PCP.   #Paroxysmal Atrial Flutter She remained NSR on EKG and telemetry. She was continued on home apixaban  (Eliquis ) 5 mg.   #Multiple lung nodules Noted incidentally on CT 04/2024. CT will need to be rescheduled.      Subjective Patient reports feeling well, better than she had at admission overall. She slept well and has no complaints for today. Denies any other foot pain this morning. Has not gotten compression hose today. Appetite has been sustained and she is agreeable to going to SNF to regain her strength.   Discharge Exam:   BP 122/67 (BP Location: Right Arm)   Pulse 82   Temp 97.8 F (36.6 C)   Resp 16   Ht 5' 2 (1.575 m)   Wt 76.7 kg   SpO2 100%   BMI 30.91 kg/m   Physical Exam:   Constitutional: well-appearing female sitting up in hospital bed, in no acute distress.  HEENT: normocephalic atraumatic, mucous membranes moist Eyes: conjunctiva non-erythematous Pulmonary/Chest: normal work of breathing on room air MSK: normal bulk and tone. Neurological: alert & oriented x 3 Skin: warm and dry Psych: mood calm, behavior normal, thought content normal, judgement normal    Pertinent Labs, Studies, and Procedures:     Latest Ref Rng & Units 05/13/2024    5:16 AM 05/12/2024    5:42 AM 05/11/2024    2:02 AM  CBC   WBC 4.0 - 10.5 K/uL 4.0 - 10.5 K/uL 11.5    11.5  11.3  13.4   Hemoglobin 12.0 - 15.0 g/dL 87.9 - 84.9 g/dL 9.7    9.7  9.6  9.7   Hematocrit 36.0 - 46.0 % 36.0 - 46.0 % 31.5    31.4  31.3  31.7   Platelets 150 - 400 K/uL 150 - 400 K/uL 421    429  437  438        Latest Ref Rng & Units 05/13/2024    5:16 AM 05/12/2024    5:42 AM 05/11/2024    2:02 AM  CMP  Glucose 70 - 99 mg/dL 844  869  848   BUN 8 - 23 mg/dL 28  24  26    Creatinine 0.44 - 1.00 mg/dL 8.59  8.83  8.71   Sodium 135 - 145 mmol/L 140  143  140   Potassium 3.5 - 5.1 mmol/L 4.2  4.2  4.2   Chloride 98 - 111 mmol/L 106  109  105   CO2 22 - 32 mmol/L 27  26  27    Calcium  8.9 - 10.3 mg/dL 8.8  9.0  9.1     US  RENAL Result Date: 05/07/2024 CLINICAL DATA:  [id number] AKI (acute kidney injury) (HCC) [id number] EXAM: RENAL / URINARY TRACT ULTRASOUND COMPLETE COMPARISON:  Ultrasound renal 07/31/2022 FINDINGS: Right Kidney: Renal measurements: 9.3 x 3.9 x 3.7 cm = volume: 70 mL. Echogenicity within normal limits. No mass or hydronephrosis visualized. Left Kidney: Renal measurements: 9 x 4.7 x 3.2 cm = volume: 72 mL. Echogenicity within normal limits. No mass or hydronephrosis visualized. Urinary bladder: Appears normal for degree of bladder distention. Other: None. IMPRESSION: Unremarkable renal ultrasound. Electronically Signed   By: Morgane  Naveau M.D.   On: 05/07/2024 22:37   DG CHEST PORT 1 VIEW Result Date: 05/07/2024 CLINICAL DATA:  Shortness of breath. EXAM: PORTABLE CHEST 1 VIEW COMPARISON:  Chest radiograph dated 04/13/2024 FINDINGS: No focal consolidation, pleural effusion or pneumothorax. Top-normal  cardiac silhouette. No acute bony AICD device. No acute osseous pathology. IMPRESSION: No active disease. Electronically Signed   By: Vanetta Chou M.D.   On: 05/07/2024 20:01     Discharge Instructions: Discharge Instructions     Diet - low sodium heart healthy   Complete by: As directed    Increase activity slowly    Complete by: As directed          Discharge Instructions      You were hospitalized for painful leg swelling and abnormal labs. We were initially concerned your symptoms were related to your heart failure, but after a work up determined that you had a kidney injury, likely connected to dehydration and a urinary tract infection. We treated you with antibiotics and made some changes to your medications, which helped to improve your UTI, your kidney function and your symptoms. Thank you for allowing us  to be part of your care.   We arranged for you to follow up at:  Cardiology on 05/21/2024 at: 10:20am (please arrive by 10:00am)  [address]  [place] [address] Please call [phone] to make an appointment with your primary care provider upon discharge from the rehabilitation skilled nursing.   Please note these changes made to your medications:   *Please START taking:   1. Bisoprolol  2.5 mg once daily (new dosage)  2. Spironolactone  12.5 mg once daily (new dosage) *Please CONTINUE taking:   1. Apixaban  (Eliquis ) 5 mg two times daily  2. Atorvastatin  (Lipitor ) 80 mg once daily  3. Insulin  Degludec (Tresiba ) 5 units daily  4. Ozempic  1 mg inject weekly   5. Anoro Ellipta  1 puff daily *Please STOP taking:   1. Dapagliflozin  propanediol (Farxiga ) 10 mg until you see your primary care provider  2. Torsemide  (Demadex ) 40 mg until you see your primary care provider  We arranged for you to follow up at:   Contact information for follow-up providers     Lesia Ozell Barter, PA-C Follow up on 05/21/2024.   Specialty: Cardiology Why: At 10:20 AM Contact information: [address] [place] [address] [phone]         Peggy Homans, MD. Schedule an appointment as soon as possible for a visit.   Specialty: Internal Medicine Why: Please call and make and appointment upon discharge from the rehabiliation skilled nursing facility. Contact information: [address], [address] [place] [address] [phone]              Contact information for after-discharge care     Destination     [facility] .   Service: Skilled Nursing Contact information: [address] [place]  [address] [phone]                      For questions about your care plan, until you are able to see your primary doctor: Call [phone]. Dial [phone] for the operator. Ask for the internal medicine resident on call.  Thank you for allowing us  to be part of your care.   Doyal Miyamoto, MD 05/13/2024, 10:59 AM        Signed: Doyal Miyamoto, MD 05/13/2024, 11:20 AM

## 2024-05-13 NOTE — Plan of Care (Signed)

## 2024-05-13 NOTE — Progress Notes (Incomplete)
 HD#5 SUBJECTIVE:  Patient Summary: [name] is a 73 y.o. female with PMH of HFrEF (04/2024 EF: 20-25%), CKD 4, COPD, HTN, T2DM, HLD, PAF on Eliquis , and R ankle fx s/p ORIF who presents following abnormal labs and shortness of breath and admitted for AKI on CKD in the setting of a UTI, now improving.  Overnight Events: None  Interim History:   OBJECTIVE:  Vital Signs: Vitals:   05/12/24 1219 05/12/24 1617 05/12/24 2331 05/13/24 0519  BP: 105/69 (!) 120/58 128/75 (!) 126/53  Pulse: 86 73 79 78  Resp:  16 18 16   Temp:  97.8 F (36.6 C) (!) 97.5 F (36.4 C) 97.8 F (36.6 C)  TempSrc:   Oral   SpO2: 95% 100% 100% 94%  Weight:      Height:       Supplemental O2: Room Air SpO2: 94 %  Filed Weights   05/11/24 0706 05/12/24 0500 05/12/24 0853  Weight: 76 kg 75.8 kg 76.7 kg     Intake/Output Summary (Last 24 hours) at 05/13/2024 0641 Last data filed at 05/12/2024 1255 Gross per 24 hour  Intake --  Output 450 ml  Net -450 ml   Net IO Since Admission: 348.75 mL [05/13/24 0641]  Physical Exam: Const: Awake, alert in NAD HENT: Normocephalic, atraumatic, mucus membranes moist Card: RRR, No MRG, Minimal pitting edema on bilateral lower extremities Resp: LCTAB, no increased work of breathing Extremities: No tenderness to palpation BLE. No open wounds or overt edema noted.   Patient Lines/Drains/Airways Status     Active Line/Drains/Airways     Name Placement date Placement time Site Days   Peripheral IV 05/10/24 22 G 1.75 Anterior;Left Forearm 05/10/24  1620  Forearm  2            Pertinent labs and imaging:      Latest Ref Rng & Units 05/13/2024    5:16 AM 05/12/2024    5:42 AM 05/11/2024    2:02 AM  CBC  WBC 4.0 - 10.5 K/uL 11.5  11.3  13.4   Hemoglobin 12.0 - 15.0 g/dL 9.7  9.6  9.7   Hematocrit 36.0 - 46.0 % 31.5  31.3  31.7   Platelets 150 - 400 K/uL 421  437  438        Latest Ref Rng & Units 05/13/2024    5:16 AM 05/12/2024    5:42 AM 05/11/2024     2:02 AM  CMP  Glucose 70 - 99 mg/dL 844  869  848   BUN 8 - 23 mg/dL 28  24  26    Creatinine 0.44 - 1.00 mg/dL 8.59  8.83  8.71   Sodium 135 - 145 mmol/L 140  143  140   Potassium 3.5 - 5.1 mmol/L 4.2  4.2  4.2   Chloride 98 - 111 mmol/L 106  109  105   CO2 22 - 32 mmol/L 27  26  27    Calcium  8.9 - 10.3 mg/dL 8.8  9.0  9.1     No results found.  ASSESSMENT/PLAN:  Assessment: Principal Problem:   Acute on chronic HFrEF (heart failure with reduced ejection fraction) (HCC) Active Problems:   AKI (acute kidney injury) (HCC)   Leukocytosis   Acute cystitis with hematuria   Plan: ##Klebsiella Pneumoniae UTI #Resolved AKI on CKD 4 #Documented CKD 4 Urine culture was positive for ampicillin-resistant klebsiella pneumoniae, sensitive to CTX. CTX 5 day course completed yesterday. Scr increased from 1.16 to 1.40, but  improved from admission monitoring renal function. Stable leukocytosis, improved from prior. -Pt with documented CKD 4 at baseline, however throughout this admission, GFR 45-55. Potentially new baseline, however would need to follow up outpatient for long term trend of renal function   #Chronic Biventricular Heart Failure #BLE edema Last echo on 04/14/24 showed LVEF of 20-25% and mildly reduced right ventricular function. She has continued to have diastolic hypotension and mildly elevated pulse pressure, but otherwise hemodynamically stable, afebrile and oxygenating well on room air.  -Bisoprolol  (Zebeta ) 2.5 mg -Spironolactone  12.5 mg -Monitor volume status -Strict I/Os -Daily weights  -Continue to monitor electrolytes and renal function.  -Plan for torsemide  20 mg PRN after d/c   #T2DM Most recent A1C on 03/25/24 was 7.5. Patient measures glucose at home with Dexcom. Takes home Ozempic , Farxiga , and Tresiba  5 units daily. Farxiga  held d/t UTI. CBGs have been stable. -Continue home sliding scale insulin  -Continue home Glargine (Semglee ) 5 units -Meal time coverage  added today due to hyperglycemia in the afternoon/evening. Will hold off on adjusting glargine due to blood sugars ~130's over night.  -Recommend Ozempic  administration at SNF.   #COPD Patient notes most of functional impairment is due to pain, but also reports increased dyspnea on exertion. Appeared stable on room air. - 2 L/min O2 via [place] if needed - Continue home umeclidinium-vilanterol (Anoro Elipta) 62.5-25 MCG/ACT 1 puff    #HLD Most recent LDL 12/28/23 was 122. -Continue home atorvastatin  (Lipitor ) 80 mg -Follow up with PCP   #Paroxysmal Atrial Flutter Stable. NSR on EKG.  -continue home apixaban  (Eliquis ) 5 mg   #Multiple lung nodules Noted incidentally on CT 04/2024. -CT w/o contrast scheduled on 08/11  Best Practice: Diet: Renal diet VTE: Place TED hose Start: 05/08/24 0754 Code: Full  Disposition planning: Therapy Recs: None, DME: none DISPO: Anticipated discharge tomorrow to Skilled nursing facility pending placement.  Signature:  Nunzio DELENA Bolt Verma [name]  6:41 AM, 05/13/2024  On Call pager [phone]

## 2024-05-13 NOTE — Care Management Important Message (Signed)
 Important Message  Patient Details  Name: [name] MRN: [id number] Date of Birth: 1951/01/16   Important Message Given:  Yes - Medicare IM Patient left prior to IM delivery will mail to the patient home address    Claretta Deed 05/13/2024, 1:59 PM

## 2024-05-13 NOTE — Care Management Important Message (Signed)
 Important Message  Patient Details  Name: [name] MRN: [id number] Date of Birth: 1951/01/25   Important Message Given:  Yes - Medicare IM  Patient left prior to IM delivery will mail a copy to the patient home address.    Shonte Soderlund 05/13/2024, 4:05 PM

## 2024-05-13 NOTE — Progress Notes (Signed)
 Report given to [facility] at [facility]

## 2024-05-14 DIAGNOSIS — M6281 Muscle weakness (generalized): Secondary | ICD-10-CM | POA: Diagnosis not present

## 2024-05-14 DIAGNOSIS — E119 Type 2 diabetes mellitus without complications: Secondary | ICD-10-CM | POA: Diagnosis not present

## 2024-05-14 DIAGNOSIS — J449 Chronic obstructive pulmonary disease, unspecified: Secondary | ICD-10-CM | POA: Diagnosis not present

## 2024-05-14 DIAGNOSIS — Z7901 Long term (current) use of anticoagulants: Secondary | ICD-10-CM | POA: Diagnosis not present

## 2024-05-14 DIAGNOSIS — N3000 Acute cystitis without hematuria: Secondary | ICD-10-CM | POA: Diagnosis not present

## 2024-05-14 DIAGNOSIS — N1832 Chronic kidney disease, stage 3b: Secondary | ICD-10-CM | POA: Diagnosis not present

## 2024-05-14 DIAGNOSIS — R918 Other nonspecific abnormal finding of lung field: Secondary | ICD-10-CM | POA: Diagnosis not present

## 2024-05-14 DIAGNOSIS — N179 Acute kidney failure, unspecified: Secondary | ICD-10-CM | POA: Diagnosis not present

## 2024-05-14 DIAGNOSIS — Z5189 Encounter for other specified aftercare: Secondary | ICD-10-CM | POA: Diagnosis not present

## 2024-05-14 DIAGNOSIS — I5023 Acute on chronic systolic (congestive) heart failure: Secondary | ICD-10-CM | POA: Diagnosis not present

## 2024-05-14 DIAGNOSIS — I480 Paroxysmal atrial fibrillation: Secondary | ICD-10-CM | POA: Diagnosis not present

## 2024-05-14 DIAGNOSIS — I4892 Unspecified atrial flutter: Secondary | ICD-10-CM | POA: Diagnosis not present

## 2024-05-14 DIAGNOSIS — R2689 Other abnormalities of gait and mobility: Secondary | ICD-10-CM | POA: Diagnosis not present

## 2024-05-16 ENCOUNTER — Other Ambulatory Visit

## 2024-05-16 DIAGNOSIS — Z7901 Long term (current) use of anticoagulants: Secondary | ICD-10-CM | POA: Diagnosis not present

## 2024-05-16 DIAGNOSIS — Z5189 Encounter for other specified aftercare: Secondary | ICD-10-CM | POA: Diagnosis not present

## 2024-05-16 DIAGNOSIS — I5023 Acute on chronic systolic (congestive) heart failure: Secondary | ICD-10-CM | POA: Diagnosis not present

## 2024-05-16 DIAGNOSIS — N179 Acute kidney failure, unspecified: Secondary | ICD-10-CM | POA: Diagnosis not present

## 2024-05-16 DIAGNOSIS — J449 Chronic obstructive pulmonary disease, unspecified: Secondary | ICD-10-CM | POA: Diagnosis not present

## 2024-05-16 DIAGNOSIS — E119 Type 2 diabetes mellitus without complications: Secondary | ICD-10-CM | POA: Diagnosis not present

## 2024-05-16 DIAGNOSIS — I4892 Unspecified atrial flutter: Secondary | ICD-10-CM | POA: Diagnosis not present

## 2024-05-16 DIAGNOSIS — N3000 Acute cystitis without hematuria: Secondary | ICD-10-CM | POA: Diagnosis not present

## 2024-05-16 DIAGNOSIS — R918 Other nonspecific abnormal finding of lung field: Secondary | ICD-10-CM | POA: Diagnosis not present

## 2024-05-16 DIAGNOSIS — N1832 Chronic kidney disease, stage 3b: Secondary | ICD-10-CM | POA: Diagnosis not present

## 2024-05-16 NOTE — Progress Notes (Deleted)
 05/16/2024 Name: [name] MRN: [id number] DOB: 1951/07/30  No chief complaint on file.   [name] is a 73 y.o. year old female who presented for a telephone visit.   They were referred to the pharmacist by their PCP for assistance in managing complex medication management. PMH includes HTN, HFrEF (EF ***), atrial flutter, COPD, T2DM with gastroparesis, MASL, CKD4, HLD, BMI > 30, tobacco use.   Subjective: Patient was last seen by [name], MD, on 05/06/24. At last visit, BP was 109/58 mmHg, HR 89 bpm. Patient appeared to be volume overloaded in the setting of holding potassium, spironolactone , and torsemide  for AKI and hyperkalemia. She was restarted on torsemide  20 mg daily. Her labs returned with Scr further increased from 2.39 > 3.30, K 2.9 and she was directly admitted for IV diuresis. She was admitted from 05/07/24 to 05/13/24. She was found to have a UTI and Farxiga  was held during admission and at discharge. Her bisoprolol  and spironolactone  were dose-reduced. She was instructed to take torsemide  PRN.  She was discharged to SNF [facility]  Today, patient presents in *** good spirits and presents without *** any assistance. ***Patient is accompanied by ***.    Care Team: Primary Care Provider: [name], MD ; Next Scheduled Visit: Needs to be scheduled upon discharge from SNF Cardiologist: [name], PA; Next Scheduled Visit: 05/21/24  Medication Access/Adherence  Current Pharmacy:  [id number] [address] [place], [place] - [address] AT [facility] [address] [place] [address] Phone: [phone] Fax: [phone]  [name] [address] [place] [address] Phone: [phone] Fax: [phone]   Patient reports affordability concerns with their medications: {YES/NO:21197} Patient reports access/transportation concerns to their pharmacy: {YES/NO:21197} Patient reports adherence concerns with  their medications:  {YES/NO:21197} ***  *** Patient denies adherence with medications, reports missing *** medications *** times per week, on average.   Diabetes:  Current medications: insulin  degludec (Tresiba ) 5 units daily, Ozempic  1 mg once weekly Medications tried in the past: ***  Current glucose readings: *** Using *** meter; testing *** times daily  Date of Download: *** % Time CGM is active: ***% Average Glucose: *** mg/dL Glucose Management Indicator: ***  Glucose Variability: *** (goal <36%) Time in Goal:  - Time in range 70-180: ***% - Time above range: ***% - Time below range: ***% Observed patterns:  Patient {Actions; denies-reports:120008} hypoglycemic s/sx including ***dizziness, shakiness, sweating. Patient {Actions; denies-reports:120008} hyperglycemic symptoms including ***polyuria, polydipsia, polyphagia, nocturia, neuropathy, blurred vision.  Current meal patterns:  - Breakfast: *** - Lunch *** - Supper *** - Snacks *** - Drinks ***  Current physical activity: ***  Current medication access support: ***  Heart Failure (EF ***%):  Current medications:  ACEi/ARB/ARNI: none SGLT2i: Farxiga  currently being held s/p tx for UTI Beta blocker: bisoprolol  2.5 mg daily Mineralocorticoid Receptor Antagonist: spironolactone  12.5 mg daily Diuretic regimen: torsemide  20 mg PRN ?*** not on med list  Current home blood pressure readings: *** Current home weights: ***  Patient {Actions; denies-reports:120008} volume overload signs or symptoms including ***shortness of breath, lower extremity edema, increased use of pillows at night   Current physical activity: ***  Current medication access support: ***  COPD:  Current medications: Anoro ellipta  (umeclidinium-vilaternol) 62.5-25 mcg/act 1 puff daily Medications tried in the past:   Reports *** exacerbations in the past year  mMRC score: *** CAT score: ***  Current medication access support:  ***  Atrial Flutter:  Current medications:  Rate Control: bisoprolol  2.5 mg daily Anticoagulation Regimen: Apixaban  5 mg BID   Medication Management:  Current adherence strategy: ***  Patient reports {Good Fair [phone]} adherence to medications  Patient reports the following barriers to adherence: ***  Recent fill dates:    Objective:  BP Readings from Last 3 Encounters:  05/13/24 122/67  05/06/24 105/61  05/06/24 (!) 109/58    Lab Results  Component Value Date   HGBA1C 7.5 (A) 03/25/2024   HGBA1C 7.8 (A) 12/26/2023   HGBA1C 9.8 (A) 10/11/2023       Latest Ref Rng & Units 05/13/2024    5:16 AM 05/12/2024    5:42 AM 05/11/2024    2:02 AM  BMP  Glucose 70 - 99 mg/dL 844  869  848   BUN 8 - 23 mg/dL 28  24  26    Creatinine 0.44 - 1.00 mg/dL 8.59  8.83  8.71   Sodium 135 - 145 mmol/L 140  143  140   Potassium 3.5 - 5.1 mmol/L 4.2  4.2  4.2   Chloride 98 - 111 mmol/L 106  109  105   CO2 22 - 32 mmol/L 27  26  27    Calcium  8.9 - 10.3 mg/dL 8.8  9.0  9.1     Lab Results  Component Value Date   CHOL 194 12/28/2023   HDL 59 12/28/2023   LDLCALC 122 (H) 12/28/2023   LDLDIRECT 122.3 03/06/2007   TRIG 66 12/28/2023   CHOLHDL 3.3 12/28/2023    Medications Reviewed Today   Medications were not reviewed in this encounter       Assessment/Plan:   {Pharmacy A/P Choices:26421}  Written patient instructions provided. Patient verbalized understanding of treatment plan.   Follow Up Plan: *** Pharmacist *** PCP clinic visit in *** Patient seen with ***  ***

## 2024-05-17 DIAGNOSIS — I480 Paroxysmal atrial fibrillation: Secondary | ICD-10-CM | POA: Diagnosis not present

## 2024-05-17 DIAGNOSIS — R2689 Other abnormalities of gait and mobility: Secondary | ICD-10-CM | POA: Diagnosis not present

## 2024-05-17 DIAGNOSIS — M6281 Muscle weakness (generalized): Secondary | ICD-10-CM | POA: Diagnosis not present

## 2024-05-17 DIAGNOSIS — J449 Chronic obstructive pulmonary disease, unspecified: Secondary | ICD-10-CM | POA: Diagnosis not present

## 2024-05-17 DIAGNOSIS — I5023 Acute on chronic systolic (congestive) heart failure: Secondary | ICD-10-CM | POA: Diagnosis not present

## 2024-05-20 DIAGNOSIS — E1122 Type 2 diabetes mellitus with diabetic chronic kidney disease: Secondary | ICD-10-CM | POA: Diagnosis not present

## 2024-05-20 DIAGNOSIS — J449 Chronic obstructive pulmonary disease, unspecified: Secondary | ICD-10-CM | POA: Diagnosis not present

## 2024-05-20 DIAGNOSIS — I4892 Unspecified atrial flutter: Secondary | ICD-10-CM | POA: Diagnosis not present

## 2024-05-20 DIAGNOSIS — I509 Heart failure, unspecified: Secondary | ICD-10-CM | POA: Diagnosis not present

## 2024-05-20 DIAGNOSIS — N1832 Chronic kidney disease, stage 3b: Secondary | ICD-10-CM | POA: Diagnosis not present

## 2024-05-21 ENCOUNTER — Encounter: Payer: Self-pay | Admitting: Student

## 2024-05-21 ENCOUNTER — Ambulatory Visit: Attending: Student | Admitting: Student

## 2024-05-21 VITALS — BP 100/56 | HR 89 | Ht 62.0 in | Wt 174.0 lb

## 2024-05-21 DIAGNOSIS — N1832 Chronic kidney disease, stage 3b: Secondary | ICD-10-CM | POA: Diagnosis not present

## 2024-05-21 DIAGNOSIS — I5022 Chronic systolic (congestive) heart failure: Secondary | ICD-10-CM | POA: Diagnosis not present

## 2024-05-21 DIAGNOSIS — I480 Paroxysmal atrial fibrillation: Secondary | ICD-10-CM | POA: Diagnosis not present

## 2024-05-21 DIAGNOSIS — E1122 Type 2 diabetes mellitus with diabetic chronic kidney disease: Secondary | ICD-10-CM | POA: Diagnosis not present

## 2024-05-21 DIAGNOSIS — J449 Chronic obstructive pulmonary disease, unspecified: Secondary | ICD-10-CM | POA: Diagnosis not present

## 2024-05-21 DIAGNOSIS — R918 Other nonspecific abnormal finding of lung field: Secondary | ICD-10-CM | POA: Diagnosis not present

## 2024-05-21 DIAGNOSIS — R2689 Other abnormalities of gait and mobility: Secondary | ICD-10-CM | POA: Diagnosis not present

## 2024-05-21 DIAGNOSIS — N390 Urinary tract infection, site not specified: Secondary | ICD-10-CM | POA: Diagnosis not present

## 2024-05-21 DIAGNOSIS — M6281 Muscle weakness (generalized): Secondary | ICD-10-CM | POA: Diagnosis not present

## 2024-05-21 DIAGNOSIS — I509 Heart failure, unspecified: Secondary | ICD-10-CM | POA: Diagnosis not present

## 2024-05-21 DIAGNOSIS — I10 Essential (primary) hypertension: Secondary | ICD-10-CM

## 2024-05-21 DIAGNOSIS — I4892 Unspecified atrial flutter: Secondary | ICD-10-CM

## 2024-05-21 DIAGNOSIS — I5023 Acute on chronic systolic (congestive) heart failure: Secondary | ICD-10-CM | POA: Diagnosis not present

## 2024-05-21 DIAGNOSIS — N179 Acute kidney failure, unspecified: Secondary | ICD-10-CM | POA: Diagnosis not present

## 2024-05-21 LAB — CUP PACEART INCLINIC DEVICE CHECK
Battery Remaining Longevity: 6 mo
Brady Statistic RV Percent Paced: 0 %
Date Time Interrogation Session: 20250819103949
HighPow Impedance: 49.9526
Implantable Lead Connection Status: 753985
Implantable Lead Implant Date: 20080801
Implantable Lead Location: 753860
Implantable Lead Model: 7120
Implantable Pulse Generator Implant Date: 20150701
Lead Channel Impedance Value: 425 Ohm
Lead Channel Pacing Threshold Amplitude: 1 V
Lead Channel Pacing Threshold Amplitude: 1 V
Lead Channel Pacing Threshold Pulse Width: 0.5 ms
Lead Channel Pacing Threshold Pulse Width: 0.5 ms
Lead Channel Sensing Intrinsic Amplitude: 11.9 mV
Lead Channel Setting Pacing Amplitude: 2.5 V
Lead Channel Setting Pacing Pulse Width: 0.5 ms
Lead Channel Setting Sensing Sensitivity: 0.5 mV
Pulse Gen Serial Number: 7206538

## 2024-05-21 MED ORDER — TORSEMIDE 20 MG PO TABS: ORAL_TABLET | ORAL | Status: DC

## 2024-05-21 NOTE — Patient Instructions (Addendum)
 Medication Instructions:  Take torsemide  20mg  as needed, take a dose tomorrow 05/22/24 and Fri 05/24/24 if still needed. *If you need a refill on your cardiac medications before your next appointment, please call your pharmacy*  Lab Work: None ordered If you have labs (blood work) drawn today and your tests are completely normal, you will receive your results only by: MyChart Message (if you have MyChart) OR A paper copy in the mail If you have any lab test that is abnormal or we need to change your treatment, we will call you to review the results.  Follow-Up: At [facility], you and your health needs are our priority.  As part of our continuing mission to provide you with exceptional heart care, our providers are all part of one team.  This team includes your primary Cardiologist (physician) and Advanced Practice Providers or APPs (Physician Assistants and Nurse Practitioners) who all work together to provide you with the care you need, when you need it.  Your next appointment:   3 month(s)  Provider:   You will see one of the following Advanced Practice Providers on your designated Care Team:   Charlies Arthur, PA-C Michael Andy Tillery, PA-C Suzann Riddle, NP Daphne Barrack, NP  Schedule an appt with the advanced heart failure clinic for December 2025

## 2024-05-21 NOTE — Progress Notes (Signed)
  Electrophysiology Office Note:   ID:  [name], [name] 02-Sep-1951, MRN [id number]  Primary Cardiologist: Lonni LITTIE Nanas, MD Electrophysiologist: Danelle Birmingham, MD      History of Present Illness:   [name] is a 73 y.o. female with h/o CHF, NICM s/p ICD seen today for routine electrophysiology followup.   Admitted 8/5 - 8/11 for AKI, cystitis, and acute on chronic heart failure.  Since last being seen in our clinic the patient reports doing OK. She was taken off torsemide , but took a prn dose yesterday with swelling in her ankles. Thoracic impedance also elevated. Otherwise,  she denies chest pain, palpitations, dyspnea, PND, orthopnea, nausea, vomiting, dizziness, syncope, weight gain, or early satiety.   Review of systems complete and found to be negative unless listed in HPI.   EP Information / Studies Reviewed:    EKG is not ordered today. EKG from 05/07/2024 reviewed which showed NSR at 87 bpm       ICD Interrogation-  reviewed in detail today,  See PACEART report.  Arrhythmia/Device History Abbott single chamber ICD, implanted 04/02/14, Dr. Birmingham   Physical Exam:   VS:  BP (!) 100/56 (BP Location: Right Arm, Patient Position: Sitting, Cuff Size: Large)   Pulse 89   Ht 5' 2 (1.575 m)   Wt 174 lb (78.9 kg)   BMI 31.83 kg/m    Wt Readings from Last 3 Encounters:  05/21/24 174 lb (78.9 kg)  05/12/24 169 lb (76.7 kg)  05/06/24 167 lb 1.6 oz (75.8 kg)     GEN: No acute distress  NECK: No JVD; No carotid bruits CARDIAC: Regular rate and rhythm, no murmurs, rubs, gallops RESPIRATORY:  Clear to auscultation without rales, wheezing or rhonchi  ABDOMEN: Soft, non-tender, non-distended EXTREMITIES:  No edema; No deformity   ASSESSMENT AND PLAN:    Acute on Chronic systolic CHF  s/p Abbott single chamber ICD  Volume status at least moderately elevated Add torsemide  20 mg as needed back; Dose tomorrow and again Friday if needed.  Take for wt gain of 3 lbs  overnight or 5 lbs within one week. Stable on an appropriate medical regimen Normal ICD function See Pace Art report No changes today  Paroxysmal AF Regular on exam Continue eliquis  5 mg BID for CHA2DS2/VASc of at least 5  Secondary hypercoagulable state Pt on Eliquis  as above    Disposition:   Follow up with EP Team in 3 months  Signed, Ozell Prentice Passey, PA-C

## 2024-05-22 DIAGNOSIS — N1832 Chronic kidney disease, stage 3b: Secondary | ICD-10-CM | POA: Diagnosis not present

## 2024-05-22 DIAGNOSIS — E1122 Type 2 diabetes mellitus with diabetic chronic kidney disease: Secondary | ICD-10-CM | POA: Diagnosis not present

## 2024-05-22 DIAGNOSIS — I4892 Unspecified atrial flutter: Secondary | ICD-10-CM | POA: Diagnosis not present

## 2024-05-22 DIAGNOSIS — I5023 Acute on chronic systolic (congestive) heart failure: Secondary | ICD-10-CM | POA: Diagnosis not present

## 2024-05-22 DIAGNOSIS — Z7901 Long term (current) use of anticoagulants: Secondary | ICD-10-CM | POA: Diagnosis not present

## 2024-05-22 DIAGNOSIS — Z794 Long term (current) use of insulin: Secondary | ICD-10-CM | POA: Diagnosis not present

## 2024-05-24 DIAGNOSIS — I5023 Acute on chronic systolic (congestive) heart failure: Secondary | ICD-10-CM | POA: Diagnosis not present

## 2024-05-24 DIAGNOSIS — E1122 Type 2 diabetes mellitus with diabetic chronic kidney disease: Secondary | ICD-10-CM | POA: Diagnosis not present

## 2024-05-24 DIAGNOSIS — R2689 Other abnormalities of gait and mobility: Secondary | ICD-10-CM | POA: Diagnosis not present

## 2024-05-24 DIAGNOSIS — M6281 Muscle weakness (generalized): Secondary | ICD-10-CM | POA: Diagnosis not present

## 2024-05-24 DIAGNOSIS — I4892 Unspecified atrial flutter: Secondary | ICD-10-CM | POA: Diagnosis not present

## 2024-05-24 DIAGNOSIS — N1832 Chronic kidney disease, stage 3b: Secondary | ICD-10-CM | POA: Diagnosis not present

## 2024-05-24 DIAGNOSIS — I480 Paroxysmal atrial fibrillation: Secondary | ICD-10-CM | POA: Diagnosis not present

## 2024-05-24 DIAGNOSIS — J449 Chronic obstructive pulmonary disease, unspecified: Secondary | ICD-10-CM | POA: Diagnosis not present

## 2024-05-24 NOTE — Patient Outreach (Signed)
 Care Coordination   05/24/2024 Name: [name] MRN: [id number] DOB: [birth year]   Care Coordination Outreach Attempts:  An unsuccessful telephone outreach was attempted today to reschedule cancelled CCM follow-up visit due to patient being hospitalized.   Follow Up Plan:  Additional outreach attempts will be made to reschedule CCM follow-up visit.   Encounter Outcome:  No Answer. HIPAA compliant voicemail left requesting return call.   Rosaline Finlay, RN MSN [place]  VBCI Population Health RN Care Manager Direct Dial: [phone]  Fax: [phone]

## 2024-05-27 ENCOUNTER — Ambulatory Visit: Payer: Self-pay

## 2024-05-27 NOTE — Telephone Encounter (Signed)
 Called pt about her symptoms and if she can come this week - no answer; left message to call the office.

## 2024-05-27 NOTE — Telephone Encounter (Signed)
 Soonest OV is next week. Please contact pt to advise on medication and/or if there is a sooner appt available.  FYI Only or Action Required?: Action required by provider: clinical question for provider.  Patient was last seen in primary care on 05/06/2024 by Celestina Czar, MD.  Called Nurse Triage reporting No chief complaint on file..  Symptoms began several weeks ago.   Symptoms are: unchanged.  Triage Disposition: Call PCP Within 24 Hours  Patient/caregiver understands and will follow disposition?: Yes  Reason for Disposition  [1] Caller has NON-URGENT question (includes prescribed medication questions) AND [2] triager unable to answer  Answer Assessment - Initial Assessment Questions 1. MAIN CONCERN OR SYMPTOM:  What is your main concern right now? What question do you have? What's the main symptom you're worried about? (e.g., breathing difficulty, cough, fever, pain)     Patient reports being recently discharged from rehab. States she was told to schedule a f/u with PCP.  Patient states she is not sure if she should be taking her torsemide . This RN relayed that it was prescribed by her cardiologist. Pt agrees to call cardiology office to ask if she should take them.   2. BETTER-SAME-WORSE: Are you getting better, staying the same, or getting worse compared to how you felt at your last visit to the doctor (most recent medical visit)?     Same as when seen by cardiologist 05/21/24  Protocols used: Recent Medical Visit for Illness Follow-up Call-A-AH Copied from CRM #[id number]. Topic: Clinical - Red Word Triage >> May 27, 2024  1:53 PM Alfonso ORN wrote: Red Word that prompted transfer to Nurse Triage: pt. Was discharge from rehab , still not able to walk fully normally still have a little swelling on both legs   Pt. Call back # [phone] ----------------------------------------------------------------------- From previous Reason for Contact - Scheduling: Patient/patient  representative is calling to schedule an appointment. Refer to attachments for appointment information.

## 2024-05-28 ENCOUNTER — Other Ambulatory Visit

## 2024-05-28 DIAGNOSIS — J449 Chronic obstructive pulmonary disease, unspecified: Secondary | ICD-10-CM | POA: Diagnosis not present

## 2024-05-28 DIAGNOSIS — M48061 Spinal stenosis, lumbar region without neurogenic claudication: Secondary | ICD-10-CM | POA: Diagnosis not present

## 2024-05-28 DIAGNOSIS — K3184 Gastroparesis: Secondary | ICD-10-CM | POA: Diagnosis not present

## 2024-05-28 DIAGNOSIS — M810 Age-related osteoporosis without current pathological fracture: Secondary | ICD-10-CM | POA: Diagnosis not present

## 2024-05-28 DIAGNOSIS — E1122 Type 2 diabetes mellitus with diabetic chronic kidney disease: Secondary | ICD-10-CM | POA: Diagnosis not present

## 2024-05-28 DIAGNOSIS — L308 Other specified dermatitis: Secondary | ICD-10-CM | POA: Diagnosis not present

## 2024-05-28 DIAGNOSIS — J9621 Acute and chronic respiratory failure with hypoxia: Secondary | ICD-10-CM | POA: Diagnosis not present

## 2024-05-28 DIAGNOSIS — I4892 Unspecified atrial flutter: Secondary | ICD-10-CM | POA: Diagnosis not present

## 2024-05-28 DIAGNOSIS — K760 Fatty (change of) liver, not elsewhere classified: Secondary | ICD-10-CM | POA: Diagnosis not present

## 2024-05-28 DIAGNOSIS — E785 Hyperlipidemia, unspecified: Secondary | ICD-10-CM | POA: Diagnosis not present

## 2024-05-28 DIAGNOSIS — R918 Other nonspecific abnormal finding of lung field: Secondary | ICD-10-CM | POA: Diagnosis not present

## 2024-05-28 DIAGNOSIS — F17210 Nicotine dependence, cigarettes, uncomplicated: Secondary | ICD-10-CM | POA: Diagnosis not present

## 2024-05-28 DIAGNOSIS — E042 Nontoxic multinodular goiter: Secondary | ICD-10-CM | POA: Diagnosis not present

## 2024-05-28 DIAGNOSIS — I429 Cardiomyopathy, unspecified: Secondary | ICD-10-CM | POA: Diagnosis not present

## 2024-05-28 DIAGNOSIS — L2089 Other atopic dermatitis: Secondary | ICD-10-CM | POA: Diagnosis not present

## 2024-05-28 DIAGNOSIS — I083 Combined rheumatic disorders of mitral, aortic and tricuspid valves: Secondary | ICD-10-CM | POA: Diagnosis not present

## 2024-05-28 DIAGNOSIS — N184 Chronic kidney disease, stage 4 (severe): Secondary | ICD-10-CM | POA: Diagnosis not present

## 2024-05-28 DIAGNOSIS — R1314 Dysphagia, pharyngoesophageal phase: Secondary | ICD-10-CM | POA: Diagnosis not present

## 2024-05-28 DIAGNOSIS — E1143 Type 2 diabetes mellitus with diabetic autonomic (poly)neuropathy: Secondary | ICD-10-CM | POA: Diagnosis not present

## 2024-05-28 DIAGNOSIS — I130 Hypertensive heart and chronic kidney disease with heart failure and stage 1 through stage 4 chronic kidney disease, or unspecified chronic kidney disease: Secondary | ICD-10-CM | POA: Diagnosis not present

## 2024-05-28 DIAGNOSIS — I480 Paroxysmal atrial fibrillation: Secondary | ICD-10-CM | POA: Diagnosis not present

## 2024-05-28 DIAGNOSIS — I5042 Chronic combined systolic (congestive) and diastolic (congestive) heart failure: Secondary | ICD-10-CM | POA: Diagnosis not present

## 2024-05-28 DIAGNOSIS — N179 Acute kidney failure, unspecified: Secondary | ICD-10-CM | POA: Diagnosis not present

## 2024-05-28 NOTE — Patient Instructions (Signed)
 Visit Information  Thank you for taking time to visit with me today. Please don't hesitate to contact me if I can be of assistance to you before our next scheduled appointment.  Your next care management appointment is by telephone on 06/11/24 at 10:30 AM  Please call the care guide team at [phone] if you need to cancel, schedule, or reschedule an appointment.   Please call the Suicide and Crisis Lifeline: 988 call 1-800-273-TALK (toll free, 24 hour hotline) if you are experiencing a Mental Health or Behavioral Health Crisis or need someone to talk to.  Rosaline Finlay, RN MSN [place]  VBCI Population Health RN Care Manager Direct Dial: [phone]  Fax: [phone]

## 2024-05-28 NOTE — Patient Outreach (Signed)
 Complex Care Management   Visit Note  05/28/2024  Name:  [name] MRN: [id number] DOB: 1951/06/30  Situation: Referral received for Complex Care Management related to Heart Failure and COPD I obtained verbal consent from Patient.  Visit completed with Patient  on the phone  Background:   Past Medical History:  Diagnosis Date   Acute metabolic encephalopathy 07/26/2022   ATN (acute tubular necrosis) (HCC) 07/15/2014   Automatic implantable cardioverter-defibrillator in situ    Automatic implantable cardioverter-defibrillator in situ 05/04/2007   Qualifier: Diagnosis of  By: [place]     Blood transfusion without reported diagnosis    Cardiac defibrillator in situ    Atlas II VR (SJM) implanted by Dr Waddell   CHF (congestive heart failure) (HCC)    Chronic combined systolic and diastolic heart failure (HCC)    a. EF 35-40% in past;  b. Echo 7/13:  EF 45-50%, Gr 2 diast dysfn, mild AI, mild MAC, trivial MR, mild LAE, PASP 47.   Chronic ulcer of leg (HCC)    04-09-15 resolved-not a problem.   Colon polyps 04/12/2013   Rectosigmoid polyp   COPD (chronic obstructive pulmonary disease) (HCC)    O2 at night   Depression    Diabetes mellitus    Diabetes mellitus, type 2 (HCC) 04/03/2012   HIGH RISK FEET.. Please have patient take shoes and socks off every visit for visual foot inspection.     Eczema    Elevated alkaline phosphatase level    GGT and 5'nucleotidase 8/13 normal   Health care maintenance 01/22/2013   Surgically absent cervix- no pap needed (Path report 07/2000: uterine body with attached bilateral  adnexa and separate cervix.)   History of oxygen  administration    oxygen  @ 2 l/m nasally bedtime 24/7   HTN (hypertension)    Hx of cardiac cath    a. LHC 2003 normal;  b. LHC 6/13:  Mild calcification in the LM, o/w normal coronary arteries, EF 45%.    Hyperkalemia 08/08/2017   Hyperlipidemia    Elevated triglyceride in 2019   Hyperlipidemia associated with  type 2 diabetes mellitus (HCC) 05/25/2007   Qualifier: Diagnosis of  By: Gladis FNP, [place]     Hyperthyroidism, subclinical    HYPERTHYROIDISM, SUBCLINICAL 05/06/2009   Qualifier: Diagnosis of  By: Rolan Hough     Implantable cardioverter-defibrillator (ICD) generator end of life    Lipoma    Need for hepatitis C screening test 04/25/2019   NICM (nonischemic cardiomyopathy) (HCC)    Obesity    On home oxygen  therapy    2L; 24/7 (10/11/2016)   Post menopausal syndrome    Shortness of breath 07/14/2017   Skin rash 07/12/2018   Sleep apnea    pt denies 04/12/2013    Assessment: Patient Reported Symptoms:  Cognitive Cognitive Status: Able to follow simple commands, Alert and oriented to person, place, and time, Normal speech and language skills Cognitive/Intellectual Conditions Management [RPT]: None reported or documented in medical history or problem list      Neurological Neurological Review of Symptoms: No symptoms reported    HEENT HEENT Symptoms Reported: No symptoms reported      Cardiovascular Cardiovascular Symptoms Reported: Swelling in legs or feet (Patient reports a little bit of swelling around her feet, decreased since hospitalization.) Does patient have uncontrolled Hypertension?: No Cardiovascular Management Strategies: Medication therapy, Weight management Do You Have a Working Readable Scale?: Yes Weight: 175 lb (79.4 kg) (Patient reported) Cardiovascular Comment: Patient reports improved  swelling and energy since hospitalization/discharge. She returned home from SNF on Sunday 05/26/24. Hospital f/u with PCP has been scheduled.  Respiratory Respiratory Symptoms Reported: Shortness of breath Additional Respiratory Details: A little bit of shortness of breath with exertion. Patient denies mucus production at this time. She continues to wear 2L O2 at nighttime. Last PRN Alubterol use yesterday when she was moving around at the grocery store. Respiratory  Management Strategies: Oxygen  therapy  Endocrine Endocrine Symptoms Reported: No symptoms reported Is patient diabetic?: Yes Is patient checking blood sugars at home?: Yes Endocrine Comment: Patient notes Dexcom was taken off during hospitalization. She is currently charging device and plans to put it back on.  Gastrointestinal Gastrointestinal Symptoms Reported: No symptoms reported Additional Gastrointestinal Details: Patient reports an ok appetite since returning home. She reports regular BMs since returning home. Gastrointestinal Management Strategies: Coping strategies    Genitourinary Genitourinary Symptoms Reported: No symptoms reported Additional Genitourinary Details: Note during hospitalization patient was diagnosed with UTI. She reports she did not have any symptoms associated with UTI, denies symptoms at this time.    Integumentary Integumentary Symptoms Reported: Not assessed    Musculoskeletal Musculoskelatal Symptoms Reviewed: Unsteady gait, Weakness Musculoskeletal Management Strategies: Medical device (Rollator) Musculoskeletal Comment: Patient reports HH through [facility] coming out today. Falls in the past year?: No Number of falls in past year: 1 or less Was there an injury with Fall?: No Fall Risk Category Calculator: 0 Patient Fall Risk Level: Low Fall Risk Patient at Risk for Falls Due to: Impaired balance/gait Fall risk Follow up: Falls evaluation completed, Education provided  Psychosocial Psychosocial Symptoms Reported: No symptoms reported          05/28/2024    PHQ2-9 Depression Screening   Little interest or pleasure in doing things    Feeling down, depressed, or hopeless    PHQ-2 - Total Score    Trouble falling or staying asleep, or sleeping too much    Feeling tired or having little energy    Poor appetite or overeating     Feeling bad about yourself - or that you are a failure or have let yourself or your family down    Trouble concentrating on  things, such as reading the newspaper or watching television    Moving or speaking so slowly that other people could have noticed.  Or the opposite - being so fidgety or restless that you have been moving around a lot more than usual    Thoughts that you would be better off dead, or hurting yourself in some way    PHQ2-9 Total Score    If you checked off any problems, how difficult have these problems made it for you to do your work, take care of things at home, or get along with other people    Depression Interventions/Treatment      There were no vitals filed for this visit.  Medications Reviewed Today     Reviewed by Arno Rosaline SQUIBB, RN (Registered Nurse) on 05/28/24 at 1020  Med List Status: <None>   Medication Order Taking? Sig Documenting Provider Last Dose Status Informant  acetaminophen  (TYLENOL ) 500 MG tablet [id number] Yes Take 2 tablets (1,000 mg total) by mouth every 8 (eight) hours as needed for mild pain (pain score 1-3). Nguyen, Diana, MD  Active   albuterol  (VENTOLIN  HFA) 108 [phone] Base) MCG/ACT inhaler [id number] Yes Inhale 2 puffs into the lungs every 6 (six) hours as needed for wheezing or shortness of breath. [name], MD  Active Self, Pharmacy Records  ANORO ELLIPTA  62.5-25 MCG/ACT AEPB [id number] Yes INHALE 1 PUFF INTO THE LUNGS DAILY Amoako, Prince, MD  Active Self, Pharmacy Records  apixaban  (ELIQUIS ) 5 MG TABS tablet [id number] Yes Take 1 tablet (5 mg total) by mouth 2 (two) times daily. Bensimhon, Toribio SAUNDERS, MD  Active Self, Pharmacy Records           Med Note (Uthman Mroczkowski P   Fri May 03, 2024  9:15 AM)    atorvastatin  (LIPITOR ) 80 MG tablet [id number] Yes Take 1 tablet (80 mg total) by mouth at bedtime. Elicia Sharper, DO  Active Self, Pharmacy Records  bisoprolol  (ZEBETA ) 5 MG tablet [id number] Yes Take 0.5 tablets (2.5 mg total) by mouth daily. D'Mello, Rosalyn, DO  Active Self, Pharmacy Records  calcium -vitamin D  (OSCAL WITH D) 500-200 MG-UNIT tablet  [id number] Yes Take 1 tablet by mouth 2 (two) times daily. Lovey Satterfield, MD  Active Self, Pharmacy Records  Continuous Glucose Sensor [facility] G7 SENSOR) [place] [id number] Yes Inject 1 Application into the skin. Every 10 days [provider]  Active Self, Pharmacy Records  dapagliflozin  propanediol (FARXIGA ) 10 MG TABS tablet [id number]  Take 1 tablet (10 mg total) by mouth daily.  Patient not taking: Reported on 05/28/2024   Amoako, Prince, MD  Active Self, Pharmacy Records  insulin  degludec (TRESIBA ) 100 UNIT/ML FlexTouch Pen [id number] Yes Inject 5 Units into the skin daily. D'Mello, Rosalyn, DO  Active Self, Pharmacy Records  ketoconazole  (NIZORAL ) 2 % shampoo [id number] Yes Apply 1 Application topically once a week. [provider]  Active Self, Pharmacy Records           Med Note LEOBARDO, NICOLE   Sat Apr 13, 2024 10:40 PM) Patient uses on Tuesdays  levocetirizine (XYZAL ) 5 MG tablet [id number] Yes Take 1 tablet (5 mg total) by mouth at bedtime. Elicia Sharper, DO  Active Self, Pharmacy Records           Med Note [facility] A   Tue May 07, 2024  3:07 PM)    OXYGEN  [id number] Yes Inhale 2 L into the lungs at bedtime. [provider]  Active Self, Pharmacy Records  OZEMPIC , 1 MG/DOSE, 4 MG/3ML SOPN [id number] Yes Inject 1 mg into the skin once a week. Takes on Saturdays [provider]  Active Self, Pharmacy Records  senna-docusate (SENOKOT-S) 8.6-50 MG tablet [id number] Yes Take 1 tablet by mouth at bedtime as needed for mild constipation. Nguyen, Diana, MD  Active   spironolactone  (ALDACTONE ) 25 MG tablet [id number] Yes Take 0.5 tablets (12.5 mg total) by mouth daily. D'Mello, Rosalyn, DO  Active Self, Pharmacy Records  torsemide  (DEMADEX ) 20 MG tablet [id number] Yes Take 1 tablet by mouth as needed, take a dose tomorrow 05/22/24 and Fri 05/24/24 if still needed. Lesia Sharper Barter, PA-C  Active            Med Note (Fern Asmar P   Tue May 28, 2024 10:19 AM) Patient reports she is taking every other day per provider instructions  triamcinolone  ointment (KENALOG ) 0.1 % [id number] Yes Apply 1 Application topically 2 (two) times daily. Mix with Eucerin cream [provider]  Active Self, Pharmacy Records            Recommendation:   Acute PCP follow-up 06/05/24 for hospital follow-up. Continue Current Plan of Care  Follow Up Plan:   Telephone follow up appointment date/time:  06/11/24 at 10:30 AM  Rosaline Finlay, RN MSN [place]  VBCI  Population Health RN Care Manager Direct Dial: [phone]  Fax: [phone]

## 2024-05-29 DIAGNOSIS — J449 Chronic obstructive pulmonary disease, unspecified: Secondary | ICD-10-CM | POA: Diagnosis not present

## 2024-05-29 DIAGNOSIS — I509 Heart failure, unspecified: Secondary | ICD-10-CM | POA: Diagnosis not present

## 2024-05-30 ENCOUNTER — Other Ambulatory Visit (INDEPENDENT_AMBULATORY_CARE_PROVIDER_SITE_OTHER)

## 2024-05-30 DIAGNOSIS — E1122 Type 2 diabetes mellitus with diabetic chronic kidney disease: Secondary | ICD-10-CM

## 2024-05-30 DIAGNOSIS — I5042 Chronic combined systolic (congestive) and diastolic (congestive) heart failure: Secondary | ICD-10-CM

## 2024-05-30 NOTE — Progress Notes (Signed)
 05/30/2024 Name: [name] MRN: [id number] DOB: 10/23/1950  Chief Complaint  Patient presents with   Diabetes   Congestive Heart Failure   COPD    [name] is a 73 y.o. year old female who presented for a telephone visit.   They were referred to the pharmacist by their PCP for assistance in managing complex medication management. PMH includes HTN, HFrEF (EF 20-25%) s/p ICD, atrial flutter, COPD, T2DM with gastroparesis, MASL, CKD4, HLD, BMI > 30, tobacco use.   Subjective: Patient was last seen by Peggy Sandhoff, MD, on 05/06/24. At last visit, BP was 109/58 mmHg, HR 89 bpm. Patient appeared to be volume overloaded in the setting of holding potassium, spironolactone , and torsemide  for AKI and hyperkalemia. She was restarted on torsemide  20 mg daily. Her labs returned with Scr further increased from 2.39 > 3.30, K 2.9 and she was directly admitted for IV diuresis. She was admitted from 05/07/24 to 05/13/24. She was found to have a UTI and Farxiga  was held during admission and at discharge. Her bisoprolol  and spironolactone  were dose-reduced. She was instructed to take torsemide  PRN.  She was discharged to SNF [name]). She was initially scheduled for a telephone appt with pharmacy on 05/16/24, but this was reschedule since she was still residing at [name]. Patient was seen by cardiology, Peggy Passey, PA on 05/21/24. Patient reported that she was taking torsemide  PRN for intermittent swelling, and was instructed to continue torsemide  20 mg PRN as she was hypervolemic on exam. No other changes were made to her medication regimen.  Today, patient reports doing well. She is able to review all her medication bottles with me today from home. She has scheduled her PCP f/u appt. She was discharged from SNF on Sunday and reports she has been managing her own medications since then.   Care Team: Primary Care Provider: [name], MD ; Scheduled with Dr. Geryl [name] on  06/05/24 Cardiologist: Peggy Passey, PA; Next Scheduled Visit: 05/21/24  Medication Access/Adherence  Current Pharmacy:  [name], [name] Phone: [phone] Fax: [phone]  [name] [address] [place] [address] Phone: [phone] Fax: [phone]   Patient reports affordability concerns with their medications: No  Patient reports access/transportation concerns to their pharmacy: No  - she picks up her own medications from [facility] Patient reports adherence concerns with their medications:  No  - has correct medication bottles at home  Heart Failure (EF 20-25%):  Current medications:  ACEi/ARB/ARNI: none SGLT2i: Farxiga  currently being held s/p tx for UTI (patient confirms that she has bottle but has not been taking) Beta blocker: bisoprolol  2.5 mg daily (confirms she has been cutting 5 mg tab in half, has 6-7 tablets left) Mineralocorticoid Receptor Antagonist: spironolactone  12.5 mg daily (confirms she has been cutting in half) Diuretic regimen: torsemide  20 mg PRN (reports that she took dose on Mon and Thurs AM)  Current home weights: stable ~175 lbs. Reports that she weighs herself in the morning  Patient reports volume overload signs or symptoms including minimal LE swelling - which she reports is currently stable, SHoB with exertion. Denies orthopnea. She is sleeping directly on 2 pillows.  Current physical activity: reports she has been able to get up and walk around at home  Current medication access support: [name]  Diabetes:  Current medications: insulin  degludec (Tresiba ) 5  units daily, Ozempic  1 mg once weekly (waiting until Saturday to take it, confirmed with [name] that she received doses on 8/15 and 8/22 at SNF)  Medications tried in the past: metformin  (renal function)  Current glucose readings: not  currently checking. Reports she is currently charging her Dexcom reader and will apply sensor today or tomorrow.   Patient denies hypoglycemic s/sx including dizziness, shakiness, sweating. Patient denies hyperglycemic symptoms including polyuria, polydipsia, polyphagia, nocturia, neuropathy, blurred vision.  Current meal patterns: Usually eating baked foods, chicken sandwich, salad.  - Drinks: Drinking water throughout the day, 3-4 bottles of water per day. Reports that she has juice 1-2x per day. Reports that she dilutes her juice with water.   COPD:  Current medications: Anoro ellipta  (umeclidinium-vilaternol) 62.5-25 mcg/act 1 puff daily  Atrial Flutter:  Current medications:  Rate Control: bisoprolol  2.5 mg daily Anticoagulation Regimen: Apixaban  5 mg BID  Medication Management:  Current adherence strategy: patient is managing her own medications, appears to understand regimen and importance of adherence  Patient reports Good adherence to medications  Recent fill hx influenced by recent admission   Objective:  BP Readings from Last 3 Encounters:  05/21/24 (!) 100/56  05/13/24 122/67  05/06/24 105/61    Lab Results  Component Value Date   HGBA1C 7.5 (A) 03/25/2024   HGBA1C 7.8 (A) 12/26/2023   HGBA1C 9.8 (A) 10/11/2023       Latest Ref Rng & Units 05/13/2024    5:16 AM 05/12/2024    5:42 AM 05/11/2024    2:02 AM  BMP  Glucose 70 - 99 mg/dL 844  869  848   BUN 8 - 23 mg/dL 28  24  26    Creatinine 0.44 - 1.00 mg/dL 8.59  8.83  8.71   Sodium 135 - 145 mmol/L 140  143  140   Potassium 3.5 - 5.1 mmol/L 4.2  4.2  4.2   Chloride 98 - 111 mmol/L 106  109  105   CO2 22 - 32 mmol/L 27  26  27    Calcium  8.9 - 10.3 mg/dL 8.8  9.0  9.1     Lab Results  Component Value Date   CHOL 194 12/28/2023   HDL 59 12/28/2023   LDLCALC 122 (H) 12/28/2023   LDLDIRECT 122.3 03/06/2007   TRIG 66 12/28/2023   CHOLHDL 3.3 12/28/2023    Medications Reviewed Today     Reviewed by  [name], [name] (Pharmacist) on 05/30/24 at 1202  Med List Status: <None>   Medication Order Taking? Sig Documenting Provider Last Dose Status Informant  acetaminophen  (TYLENOL ) 500 MG tablet [id number]  Take 2 tablets (1,000 mg total) by mouth every 8 (eight) hours as needed for mild pain (pain score 1-3). Nguyen, Diana, MD  Active   albuterol  (VENTOLIN  HFA) 108 [phone] Base) MCG/ACT inhaler [id number]  Inhale 2 puffs into the lungs every 6 (six) hours as needed for wheezing or shortness of breath. [name], MD  Active Self, Pharmacy Records  ANORO ELLIPTA  62.5-25 MCG/ACT AEPB [id number] Yes INHALE 1 PUFF INTO THE LUNGS DAILY Amoako, Prince, MD  Active Self, Pharmacy Records  apixaban  (ELIQUIS ) 5 MG TABS tablet [id number] Yes Take 1 tablet (5 mg total) by mouth 2 (two) times daily. Bensimhon, Toribio SAUNDERS, MD  Active Self, Pharmacy Records           Med Note (STEFFENS, MICHELLE P   Fri May 03, 2024  9:15 AM)    atorvastatin  (LIPITOR ) 80 MG tablet  [id number] Yes Take 1 tablet (80 mg total) by mouth at bedtime. Elicia Sharper, DO  Active Self, Pharmacy Records  bisoprolol  (ZEBETA ) 5 MG tablet [id number] Yes Take 0.5 tablets (2.5 mg total) by mouth daily. D'Mello, Rosalyn, DO  Active Self, Pharmacy Records  calcium -vitamin D  (OSCAL WITH D) 500-200 MG-UNIT tablet [id number]  Take 1 tablet by mouth 2 (two) times daily.  Patient not taking: Reported on 05/30/2024   Lovey Satterfield, MD  Active Self, Pharmacy Records  Continuous Glucose Sensor [facility] G7 SENSOR) [place] [id number]  Inject 1 Application into the skin. Every 10 days [provider]  Active Self, Pharmacy Records  dapagliflozin  propanediol (FARXIGA ) 10 MG TABS tablet [id number]  Take 1 tablet (10 mg total) by mouth daily.  Patient not taking: Reported on 05/30/2024   Amoako, Prince, MD  Active Self, Pharmacy Records  insulin  degludec (TRESIBA ) 100 UNIT/ML FlexTouch Pen [id number] Yes Inject 5 Units into the skin daily. D'Mello, Rosalyn, DO   Active Self, Pharmacy Records  ketoconazole  (NIZORAL ) 2 % shampoo [id number] Yes Apply 1 Application topically once a week. [provider]  Active Self, Pharmacy Records           Med Note LEOBARDO, NICOLE   Sat Apr 13, 2024 10:40 PM) Patient uses on Tuesdays  levocetirizine (XYZAL ) 5 MG tablet [id number] Yes Take 1 tablet (5 mg total) by mouth at bedtime. Elicia Sharper, DO  Active Self, Pharmacy Records           Med Note [facility], [place]   Tue May 07, 2024  3:07 PM)    OXYGEN  [id number]  Inhale 2 L into the lungs at bedtime. [provider]  Active Self, Pharmacy Records  OZEMPIC , 1 MG/DOSE, 4 MG/3ML SOPN [id number] Yes Inject 1 mg into the skin once a week. Takes on Saturdays [provider]  Active Self, Pharmacy Records  senna-docusate (SENOKOT-S) 8.6-50 MG tablet [id number] Yes Take 1 tablet by mouth at bedtime as needed for mild constipation. Nguyen, Diana, MD  Active   spironolactone  (ALDACTONE ) 25 MG tablet [id number] Yes Take 0.5 tablets (12.5 mg total) by mouth daily. D'Mello, Rosalyn, DO  Active Self, Pharmacy Records  torsemide  (DEMADEX ) 20 MG tablet [id number] Yes Take 1 tablet by mouth as needed, take a dose tomorrow 05/22/24 and Fri 05/24/24 if still needed. Lesia Sharper Barter, PA-C  Active            Med Note (STEFFENS, MICHELLE P   Tue May 28, 2024 10:19 AM) Patient reports she is taking every other day per provider instructions  triamcinolone  ointment (KENALOG ) 0.1 % [id number]  Apply 1 Application topically 2 (two) times daily. Mix with Eucerin cream [provider]  Active Self, Pharmacy Records              Assessment/Plan:   Heart Failure: - Currently appropriately managed. Patient continues to have some s/sx of hypervolemia. She continues to hold her SGLT2i as instructed at hospital discharge as she was treated for a UTI and AKI while admitted. Would likely be appropriate to resume at PCP f/u on 06/05/24 if she does not have any  residual urinary symptoms, as this would help her keep fluid off without additional torsemide .  - Reviewed appropriate blood pressure monitoring technique and reviewed goal blood pressure - Reviewed to weigh daily and when to contact cardiology with weight gain - Reviewed dietary modifications including limiting fluid intake to no more than 4 bottles of water per day. Recommend discussing fluid restriction  at PCP follow-up. - Recommend to continue current regimen: Current medications:  ACEi/ARB/ARNI: none SGLT2i: Farxiga  currently being held s/p tx for UTI- consider restarting at follow-up if she does not have any s/sx of a UTI and BG are controlled Beta blocker: bisoprolol  2.5 mg daily  Mineralocorticoid Receptor Antagonist: spironolactone  12.5 mg daily  Diuretic regimen: torsemide  20 mg PRN fluid/weight gain - Will collaborate with PCP to ensure refills for the correct doses of bisoprolol  and spironolactone  are sent to the pharmacy. - Recommend repeat BMP to assess kidney function and fluid status on 06/05/24   Diabetes: - Currently uncontrolled with most recent A1C of 7.5% above goal <7%. Medication adherence appears appropriate. Confirmed with SNF that she received Ozempic  1 mg weekly during her stay, and patient is aware to resume Ozempic  1 mg this weekend. Encouraged her to apply new Dexcom sensor and bring her reader with her to her upcoming PCP appt. If BG are consistently elevated, can consider small increase in basal insulin  Tresiba . - Reviewed long term cardiovascular and renal outcomes of uncontrolled blood sugar - Reviewed goal A1c, goal fasting, and goal 2 hour post prandial glucose - Reviewed hypoglycemia management plan and the rule of 15 - Reviewed dietary modifications including  utilizing the healthy plate method, limiting portion size of carbohydrate foods, increasing intake of protein and non-starchy vegetables. Specifically discussed discontinuing intake of juice and replacing  with a sugar free option like flavored water.  - Recommend to continue Ozempic  1 mg weekly, Tresiba  5 units daily - If she is eligible to restart Farxiga , this may also help achieve A1C goal  - Recommend to check glucose continuously with Dexcom G7 CGM and reader. - Next A1C due 06/25/24    Patient confirms adherence to all other medications as prescribed. Noted she will also need refills of Anoro Ellipta  and Eliquis  ordered at her next PCP appt.   Patient verbalized understanding of treatment plan.    Follow Up Plan:  Pharmacist telephone 07/04/24 PCP clinic visit in 06/05/24   Lorain Baseman, PharmD [facility] [phone]

## 2024-05-31 DIAGNOSIS — N179 Acute kidney failure, unspecified: Secondary | ICD-10-CM | POA: Diagnosis not present

## 2024-06-05 ENCOUNTER — Other Ambulatory Visit: Payer: Self-pay

## 2024-06-05 ENCOUNTER — Ambulatory Visit

## 2024-06-05 VITALS — BP 102/66 | HR 98 | Temp 97.9°F | Ht 62.0 in | Wt 176.8 lb

## 2024-06-05 DIAGNOSIS — R918 Other nonspecific abnormal finding of lung field: Secondary | ICD-10-CM | POA: Diagnosis not present

## 2024-06-05 DIAGNOSIS — Z7985 Long-term (current) use of injectable non-insulin antidiabetic drugs: Secondary | ICD-10-CM

## 2024-06-05 DIAGNOSIS — Z23 Encounter for immunization: Secondary | ICD-10-CM

## 2024-06-05 DIAGNOSIS — E1165 Type 2 diabetes mellitus with hyperglycemia: Secondary | ICD-10-CM

## 2024-06-05 DIAGNOSIS — Z794 Long term (current) use of insulin: Secondary | ICD-10-CM

## 2024-06-05 DIAGNOSIS — I5022 Chronic systolic (congestive) heart failure: Secondary | ICD-10-CM

## 2024-06-05 DIAGNOSIS — J984 Other disorders of lung: Secondary | ICD-10-CM

## 2024-06-05 MED ORDER — TRIAMCINOLONE ACETONIDE 0.1 % EX OINT: 1.0000 | TOPICAL_OINTMENT | Freq: Two times a day (BID) | CUTANEOUS | 0 refills | Status: DC

## 2024-06-05 MED ORDER — UMECLIDINIUM-VILANTEROL 62.5-25 MCG/ACT IN AEPB: 1.0000 | INHALATION_SPRAY | Freq: Every day | RESPIRATORY_TRACT | 2 refills | Status: AC

## 2024-06-05 MED ORDER — SPIRONOLACTONE 25 MG PO TABS
12.5000 mg | ORAL_TABLET | Freq: Every day | ORAL | 0 refills | Status: AC
Start: 2024-06-05 — End: ?

## 2024-06-05 MED ORDER — BISOPROLOL FUMARATE 5 MG PO TABS: 2.5000 mg | ORAL_TABLET | Freq: Every day | ORAL | 0 refills | Status: DC

## 2024-06-05 NOTE — Progress Notes (Signed)
 Established Patient Office Visit  Subjective   Patient ID: [name], female    DOB: Jul 13, 1951  Age: 73 y.o. MRN: [id number]  Patient here for hospital follow-up for acute on chronic exacerbation of heart failure with reduced ejection fraction and acute kidney injury on chronic kidney disease.  Patient doing well and has no concerns.    Patient Active Problem List   Diagnosis Date Noted   Acute cystitis with hematuria 05/09/2024   Leukocytosis 05/08/2024   Acute on chronic HFrEF (heart failure with reduced ejection fraction) (HCC) 05/07/2024   Pain and swelling of ankle 05/06/2024   Multiple lung nodules 04/23/2024   QT prolongation 04/15/2024   Acute on chronic hypoxic respiratory failure (HCC) 04/13/2024   Right ankle swelling 03/26/2024   Small vessel disease, cerebrovascular 12/28/2023   Esophageal dysphagia 10/10/2022   Preventative health care 08/18/2022   CKD (chronic kidney disease) stage 4, GFR 15-29 ml/min (HCC) 07/26/2022   COPD (chronic obstructive pulmonary disease) (HCC) 07/26/2022   Obesity (BMI 30-39.9) 07/26/2022   Paroxysmal atrial flutter (HCC) 07/25/2022   Multiple thyroid  nodules 07/04/2022   Recurrent falls 07/04/2022   Hepatic steatosis 07/28/2020   Hyperlipidemia    Osteoporosis 02/12/2020   Intrinsic atopic dermatitis 09/19/2019   Hyperkalemia 08/08/2017   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   Acute on chronic systolic heart failure (HCC) 03/18/2016   AKI (acute kidney injury) (HCC)    Spinal stenosis of lumbar region 12/09/2015   Restrictive lung disease 06/24/2015   Diabetic gastroparesis associated with type 2 diabetes mellitus (HCC) 07/12/2014   Depression 05/22/2014   DM (diabetes mellitus) type II controlled with renal manifestation (HCC) 04/03/2012   Tobacco use disorder 10/26/2009   Essential hypertension 05/25/2007   Chronic combined systolic and diastolic heart failure (HCC) 05/25/2007   ICD (implantable  cardioverter-defibrillator) in place 05/04/2007        Objective:     BP 102/66 (BP Location: Left Arm, Patient Position: Sitting, Cuff Size: Large)   Pulse 98   Temp 97.9 F (36.6 C) (Oral)   Ht 5' 2 (1.575 m)   Wt 176 lb 12.8 oz (80.2 kg)   SpO2 98%   BMI 32.34 kg/m  BP Readings from Last 3 Encounters:  06/05/24 102/66  05/21/24 (!) 100/56  05/13/24 122/67   Wt Readings from Last 3 Encounters:  06/05/24 176 lb 12.8 oz (80.2 kg)  05/28/24 175 lb (79.4 kg)  05/21/24 174 lb (78.9 kg)      Physical Exam Constitutional:      Appearance: Normal appearance.  HENT:     Nose: Nose normal.     Mouth/Throat:     Mouth: Mucous membranes are moist.     Pharynx: Oropharynx is clear.  Eyes:     Conjunctiva/sclera: Conjunctivae normal.  Cardiovascular:     Rate and Rhythm: Normal rate and regular rhythm.     Comments: No JVD Pulmonary:     Effort: Pulmonary effort is normal.     Breath sounds: Normal breath sounds.  Musculoskeletal:     Comments: Used wheelchair in the clinic today  Skin:    General: Skin is warm and dry.     Comments: Positive BL LE lymphedema  Negative BL LE pitting edema Decreased skin turgor  Diffuse, chronic eczematous patches   Neurological:     General: No focal deficit present.     Mental Status: She is alert and oriented to person, place, and time.  Psychiatric:  Mood and Affect: Mood normal.        Behavior: Behavior normal.      No results found for any visits on 06/05/24.  Last metabolic panel Lab Results  Component Value Date   GLUCOSE 155 (H) 05/13/2024   NA 140 05/13/2024   K 4.2 05/13/2024   CL 106 05/13/2024   CO2 27 05/13/2024   BUN 28 (H) 05/13/2024   CREATININE 1.40 (H) 05/13/2024   GFRNONAA 40 (L) 05/13/2024   CALCIUM  8.8 (L) 05/13/2024   PHOS 4.5 06/16/2021   PROT 6.2 (L) 05/10/2024   ALBUMIN  2.3 (L) 05/10/2024   LABGLOB 3.2 06/27/2022   AGRATIO 0.7 06/27/2022   BILITOT 0.4 05/10/2024   ALKPHOS 92  05/10/2024   AST 13 (L) 05/10/2024   ALT 10 05/10/2024   ANIONGAP 7 05/13/2024      The ASCVD Risk score (Arnett DK, et al., 2019) failed to calculate for the following reasons:   Risk score cannot be calculated because patient has a medical history suggesting prior/existing ASCVD    Assessment & Plan:   Assessment & Plan Encounter for immunization Given today Orders:   Flu vaccine HIGH DOSE PF(Fluzone Trivalent)  Restrictive lung disease Lungs clear to auscultation bilaterally.  Patient denies shortness of breath and cough. Orders:   umeclidinium-vilanterol (ANORO ELLIPTA ) 62.5-25 MCG/ACT AEPB; Inhale 1 puff into the lungs daily.  Uncontrolled type 2 diabetes mellitus with hyperglycemia (HCC) Most recent A1c in June of this year was 7.5, has been downtrending over the last 2 years.  Discussed importance of healthy eating and staying as mobile as she can.  Current regimen includes degludec 5 units daily and Ozempic  1 mg daily.  Plan to continue current regimen and will recheck an A1c at the end of September.  Consider increasing degludec if her sugars on Dexcom report are high. Orders:   Basic metabolic panel with GFR  Chronic systolic CHF (congestive heart failure) (HCC) Most recent echo consistent with EF of 20 to 25%.  Currently on bisoprolol  2.5 mg daily, spironolactone  12.5 mg daily, and torsemide  20 mg as needed.  Stable Home weight around 175 pounds, patient was 176 today and felt good.  Not volume overloaded on exam, mildly decreased skin turgor, no shortness of breath and no pitting edema.  She had a UTI in the hospital and her dapagliflozin  10 mg daily was held.  Patient's baseline creatinine appears to be 1.5, on hospital discharge it was 1.4.  Patient's blood pressure today is 102/66, this appears to be her normal.  Low threshold to add a another medicine that could drop her blood pressures.  Plan to not change medication regimen at this time, will have patient follow-up in  2 weeks after results of BMP come back.  Consider adding back dapagliflozin  10 mg daily to current regimen if creatinine and GFR allow.  Alternatively, consider discontinuing spironolactone  12.5 mg daily and replacing it with dapagliflozin  10 mg daily, to maintain fluid control without causing hypotension. Orders:   Basic metabolic panel with GFR   bisoprolol  (ZEBETA ) 5 MG tablet; Take 0.5 tablets (2.5 mg total) by mouth daily.   spironolactone  (ALDACTONE ) 25 MG tablet; Take 0.5 tablets (12.5 mg total) by mouth daily.  Multiple lung nodules During a hospitalization in July for heart failure and COPD exacerbation, multiple lung nodules were incidentally noted.  Follow-up imaging was recommended at that time.  Patient had an appointment for CT chest without contrast earlier this month but was not able to  go because she was in the hospital.  Will send this referral again.  Patient understands and in agreement. Orders:   CT Chest Wo Contrast; Future   Return in about 2 weeks (around 06/19/2024) for BP BMP and medication f/u.    Viktoria King, DO

## 2024-06-05 NOTE — Assessment & Plan Note (Signed)
 During a hospitalization in July for heart failure and COPD exacerbation, multiple lung nodules were incidentally noted.  Follow-up imaging was recommended at that time.  Patient had an appointment for CT chest without contrast earlier this month but was not able to go because she was in the hospital.  Will send this referral again.  Patient understands and in agreement. Orders:   CT Chest Wo Contrast; Future

## 2024-06-05 NOTE — Assessment & Plan Note (Signed)
 Lungs clear to auscultation bilaterally.  Patient denies shortness of breath and cough. Orders:   umeclidinium-vilanterol (ANORO ELLIPTA ) 62.5-25 MCG/ACT AEPB; Inhale 1 puff into the lungs daily.

## 2024-06-05 NOTE — Patient Instructions (Addendum)
 It was wonderful seeing you today!   Please remember to...  1) Continue taking the medications you have been taking since leaving the hospital, we did NOT change anything today. For your heart, take Bisoprolol  2.5 mg daily, spironolactone  12.5 mg daily, eliquis  5 mg twice daily, and torsemide  20 mg only as needed (when your weight increases 3 lbs in one day or 5 lbs in one week)  2) For your diabetes continue degludec 5 units daily and ozempic  1 mg weekly injections   3) Someone will reach out to you about your CT scan for your lungs   4) Follow back up with our clinic in 2 weeks to review your labs and adjust medications if necessary  If you have any questions please feel free to the call the clinic at anytime at [phone].  Have a blessed day,  Dr. Charmayne

## 2024-06-06 ENCOUNTER — Ambulatory Visit: Payer: Self-pay

## 2024-06-06 ENCOUNTER — Telehealth: Payer: Self-pay | Admitting: *Deleted

## 2024-06-06 ENCOUNTER — Other Ambulatory Visit: Payer: Self-pay

## 2024-06-06 DIAGNOSIS — I5022 Chronic systolic (congestive) heart failure: Secondary | ICD-10-CM

## 2024-06-06 LAB — BASIC METABOLIC PANEL WITH GFR
BUN/Creatinine Ratio: 16 (ref 12–28)
BUN: 30 mg/dL — ABNORMAL HIGH (ref 8–27)
CO2: 19 mmol/L — ABNORMAL LOW (ref 20–29)
Calcium: 9.1 mg/dL (ref 8.7–10.3)
Chloride: 96 mmol/L (ref 96–106)
Creatinine, Ser: 1.88 mg/dL — ABNORMAL HIGH (ref 0.57–1.00)
Glucose: 402 mg/dL — ABNORMAL HIGH (ref 70–99)
Potassium: 4.1 mmol/L (ref 3.5–5.2)
Sodium: 136 mmol/L (ref 134–144)
eGFR: 28 mL/min/1.73 — ABNORMAL LOW (ref >59)

## 2024-06-06 MED ORDER — BISOPROLOL FUMARATE 5 MG PO TABS: 2.5000 mg | ORAL_TABLET | Freq: Every day | ORAL | 1 refills | Status: AC

## 2024-06-06 NOTE — Telephone Encounter (Signed)
 Pharmacy is requesting a 90 day supply

## 2024-06-06 NOTE — Telephone Encounter (Signed)
 Mammogram appointment was canceled for 05/13/2024/ patient was seen [facility] 06-05-2024 for follow up from being in rehab. This could be the reason called patient and left message.for call back.

## 2024-06-07 NOTE — Progress Notes (Signed)
 Internal Medicine Clinic Attending  I was physically present during the key portions of the resident provided service and participated in the medical decision making of patient's management care. I reviewed pertinent patient test results.  The assessment, diagnosis, and plan were formulated together and I agree with the documentation in the resident's note. Clarification; would NOT add another BP (note states low threshold to start another medication which was likely a typo)  Trudy, Mliss Dragon, MD

## 2024-06-11 ENCOUNTER — Other Ambulatory Visit: Payer: Self-pay

## 2024-06-11 NOTE — Patient Instructions (Signed)
 Visit Information  Thank you for taking time to visit with me today. Please don't hesitate to contact me if I can be of assistance to you before our next scheduled appointment.  Your next care management appointment is by telephone on 06/25/24 at 10:30 AM  Please call the care guide team at [phone] if you need to cancel, schedule, or reschedule an appointment.   Please call the Suicide and Crisis Lifeline: 988 call 1-800-273-TALK (toll free, 24 hour hotline) if you are experiencing a Mental Health or Behavioral Health Crisis or need someone to talk to.  Rosaline Finlay, RN MSN   [facility] Population Health RN Care Manager Direct Dial: [phone]  Fax: [phone]

## 2024-06-11 NOTE — Patient Outreach (Signed)
 Complex Care Management   Visit Note  06/11/2024  Name:  [name] MRN: [id number] DOB: 1950-11-08  Situation: Referral received for Complex Care Management related to Heart Failure, COPD, and Diabetes with Complications I obtained verbal consent from Patient.  Visit completed with Patient  on the phone  Background:   Past Medical History:  Diagnosis Date   Acute metabolic encephalopathy 07/26/2022   ATN (acute tubular necrosis) (HCC) 07/15/2014   Automatic implantable cardioverter-defibrillator in situ    Automatic implantable cardioverter-defibrillator in situ 05/04/2007   Qualifier: Diagnosis of  By: [place]     Blood transfusion without reported diagnosis    Cardiac defibrillator in situ    Atlas II VR (SJM) implanted by Dr Waddell   CHF (congestive heart failure) (HCC)    Chronic combined systolic and diastolic heart failure (HCC)    a. EF 35-40% in past;  b. Echo 7/13:  EF 45-50%, Gr 2 diast dysfn, mild AI, mild MAC, trivial MR, mild LAE, PASP 47.   Chronic ulcer of leg (HCC)    04-09-15 resolved-not a problem.   Colon polyps 04/12/2013   Rectosigmoid polyp   COPD (chronic obstructive pulmonary disease) (HCC)    O2 at night   Depression    Diabetes mellitus    Diabetes mellitus, type 2 (HCC) 04/03/2012   HIGH RISK FEET.. Please have patient take shoes and socks off every visit for visual foot inspection.     Eczema    Elevated alkaline phosphatase level    GGT and 5'nucleotidase 8/13 normal   Health care maintenance 01/22/2013   Surgically absent cervix- no pap needed (Path report 07/2000: uterine body with attached bilateral  adnexa and separate cervix.)   History of oxygen  administration    oxygen  @ 2 l/m nasally bedtime 24/7   HTN (hypertension)    Hx of cardiac cath    a. LHC 2003 normal;  b. LHC 6/13:  Mild calcification in the LM, o/w normal coronary arteries, EF 45%.    Hyperkalemia 08/08/2017   Hyperlipidemia    Elevated triglyceride in 2019    Hyperlipidemia associated with type 2 diabetes mellitus (HCC) 05/25/2007   Qualifier: Diagnosis of  By: Gladis FNP, [place]     Hyperthyroidism, subclinical    HYPERTHYROIDISM, SUBCLINICAL 05/06/2009   Qualifier: Diagnosis of  By: Rolan Hough     Implantable cardioverter-defibrillator (ICD) generator end of life    Lipoma    Need for hepatitis C screening test 04/25/2019   NICM (nonischemic cardiomyopathy) (HCC)    Obesity    On home oxygen  therapy    2L; 24/7 (10/11/2016)   Post menopausal syndrome    Shortness of breath 07/14/2017   Skin rash 07/12/2018   Sleep apnea    pt denies 04/12/2013    Assessment: Patient Reported Symptoms:  Cognitive Cognitive Status: Able to follow simple commands, Alert and oriented to person, place, and time, Normal speech and language skills Cognitive/Intellectual Conditions Management [RPT]: None reported or documented in medical history or problem list      Neurological Neurological Review of Symptoms: Not assessed    HEENT HEENT Symptoms Reported: Not assessed      Cardiovascular Cardiovascular Symptoms Reported: Swelling in legs or feet (Patient reports just a little bit of swelling in her feet. She has been able to walk without a cane, which she typically needs when she has swelling) Does patient have uncontrolled Hypertension?: No Cardiovascular Management Strategies: Medication therapy, Weight management Do You Have a  Working Readable Scale?: Yes Weight: 169 lb (76.7 kg) (Patient reported)  Respiratory Respiratory Symptoms Reported: No symptoms reported Additional Respiratory Details: Patient reports she continues to wear 2L O2 at nighttime. She has not required PRN Albuterol  recently. Note that CT chest was ordered during hospital follow-up visit on 06/05/24. Patient reports this has not yet been scheduled/completed. Respiratory Management Strategies: Oxygen  therapy  Endocrine Endocrine Symptoms Reported: No symptoms reported Is  patient diabetic?: Yes Is patient checking blood sugars at home?: Yes List most recent blood sugar readings, include date and time of day: Dexcom G7, 152 fasting this morning Endocrine Comment: Patient has continued to hold dapagliflozin  as instructed. She reports she has been compliant with Tresiba  and Ozempic .  Gastrointestinal Gastrointestinal Symptoms Reported: No symptoms reported      Genitourinary Genitourinary Symptoms Reported: No symptoms reported    Integumentary Integumentary Symptoms Reported: Not assessed    Musculoskeletal Musculoskelatal Symptoms Reviewed: Unsteady gait Musculoskeletal Management Strategies: Exercise Musculoskeletal Comment: Patient continues to work with [facility] PT Falls in the past year?: No Number of falls in past year: 1 or less Was there an injury with Fall?: No Fall Risk Category Calculator: 0 Patient Fall Risk Level: Low Fall Risk Patient at Risk for Falls Due to: Impaired balance/gait Fall risk Follow up: Falls evaluation completed, Education provided  Psychosocial Psychosocial Symptoms Reported: Not assessed          06/11/2024    PHQ2-9 Depression Screening   Little interest or pleasure in doing things    Feeling down, depressed, or hopeless    PHQ-2 - Total Score    Trouble falling or staying asleep, or sleeping too much    Feeling tired or having little energy    Poor appetite or overeating     Feeling bad about yourself - or that you are a failure or have let yourself or your family down    Trouble concentrating on things, such as reading the newspaper or watching television    Moving or speaking so slowly that other people could have noticed.  Or the opposite - being so fidgety or restless that you have been moving around a lot more than usual    Thoughts that you would be better off dead, or hurting yourself in some way    PHQ2-9 Total Score    If you checked off any problems, how difficult have these problems made it for you to do your  work, take care of things at home, or get along with other people    Depression Interventions/Treatment      There were no vitals filed for this visit.  Medications Reviewed Today     Reviewed by Arno Rosaline SQUIBB, RN (Registered Nurse) on 06/11/24 at 1036  Med List Status: <None>   Medication Order Taking? Sig Documenting Provider Last Dose Status Informant  acetaminophen  (TYLENOL ) 500 MG tablet [id number]  Take 2 tablets (1,000 mg total) by mouth every 8 (eight) hours as needed for mild pain (pain score 1-3). Nguyen, Diana, MD  Active   albuterol  (VENTOLIN  HFA) 108 [phone] Base) MCG/ACT inhaler [id number]  Inhale 2 puffs into the lungs every 6 (six) hours as needed for wheezing or shortness of breath. [name], MD  Active Self, Pharmacy Records  apixaban  (ELIQUIS ) 5 MG TABS tablet [id number]  Take 1 tablet (5 mg total) by mouth 2 (two) times daily. Bensimhon, Toribio SAUNDERS, MD  Active Self, Pharmacy Records           Med Note (  Athea Haley P   Fri May 03, 2024  9:15 AM)    atorvastatin  (LIPITOR ) 80 MG tablet [id number]  Take 1 tablet (80 mg total) by mouth at bedtime. Elicia Sharper, DO  Active Self, Pharmacy Records  bisoprolol  (ZEBETA ) 5 MG tablet [id number]  Take 0.5 tablets (2.5 mg total) by mouth daily. Charmayne Holmes, DO  Active   calcium -vitamin D  (OSCAL WITH D) 500-200 MG-UNIT tablet [id number]  Take 1 tablet by mouth 2 (two) times daily.  Patient not taking: Reported on 05/30/2024   Lovey Satterfield, MD  Active Self, Pharmacy Records  Continuous Glucose Sensor [facility] G7 SENSOR) [place] [id number]  Inject 1 Application into the skin. Every 10 days [provider]  Active Self, Pharmacy Records  dapagliflozin  propanediol (FARXIGA ) 10 MG TABS tablet [id number]  Take 1 tablet (10 mg total) by mouth daily.  Patient not taking: Reported on 06/11/2024   Amoako, Prince, MD  Active Self, Pharmacy Records  insulin  degludec (TRESIBA ) 100 UNIT/ML FlexTouch Pen [id number] Yes Inject 5 Units  into the skin daily. D'Mello, Rosalyn, DO  Active Self, Pharmacy Records  ketoconazole  (NIZORAL ) 2 % shampoo [id number]  Apply 1 Application topically once a week. [provider]  Active Self, Pharmacy Records           Med Note LEOBARDO, NICOLE   Sat Apr 13, 2024 10:40 PM) Patient uses on Tuesdays  levocetirizine (XYZAL ) 5 MG tablet [id number]  Take 1 tablet (5 mg total) by mouth at bedtime. Elicia Sharper, DO  Active Self, Pharmacy Records           Med Note [facility] [place], [place] A   Tue May 07, 2024  3:07 PM)    OXYGEN  [id number]  Inhale 2 L into the lungs at bedtime. [provider]  Active Self, Pharmacy Records  OZEMPIC , 1 MG/DOSE, 4 MG/3ML SOPN [id number] Yes Inject 1 mg into the skin once a week. Takes on Saturdays [provider]  Active Self, Pharmacy Records  senna-docusate (SENOKOT-S) 8.6-50 MG tablet [id number]  Take 1 tablet by mouth at bedtime as needed for mild constipation. Nguyen, Diana, MD  Active   spironolactone  (ALDACTONE ) 25 MG tablet [id number]  Take 0.5 tablets (12.5 mg total) by mouth daily. Charmayne Holmes, DO  Active   torsemide  (DEMADEX ) 20 MG tablet [id number]  Take 1 tablet by mouth as needed, take a dose tomorrow 05/22/24 and Fri 05/24/24 if still needed. Lesia Sharper Barter, PA-C  Active            Med Note (Evangelina Delancey P   Tue May 28, 2024 10:19 AM) Patient reports she is taking every other day per provider instructions  triamcinolone  ointment (KENALOG ) 0.1 % [id number]  Apply 1 Application topically 2 (two) times daily. Mix with Eucerin cream Charmayne Holmes, DO  Active   umeclidinium-vilanterol (ANORO ELLIPTA ) 62.5-25 MCG/ACT AEPB [id number]  Inhale 1 puff into the lungs daily. Charmayne Holmes, DO  Active             Recommendation:   PCP Follow-up Continue Current Plan of Care Continue to weight yourself daily. Get in the habit of checking your blood pressure regularly.  Follow Up Plan:   Telephone follow up appointment  date/time:  06/25/24 at 10:30 AM  Rosaline Finlay, RN MSN [place]  [facility] RN Care Manager Direct Dial: [phone]  Fax: [phone]

## 2024-06-13 DIAGNOSIS — N179 Acute kidney failure, unspecified: Secondary | ICD-10-CM | POA: Diagnosis not present

## 2024-06-13 DIAGNOSIS — I4892 Unspecified atrial flutter: Secondary | ICD-10-CM | POA: Diagnosis not present

## 2024-06-13 DIAGNOSIS — M810 Age-related osteoporosis without current pathological fracture: Secondary | ICD-10-CM | POA: Diagnosis not present

## 2024-06-13 DIAGNOSIS — J449 Chronic obstructive pulmonary disease, unspecified: Secondary | ICD-10-CM | POA: Diagnosis not present

## 2024-06-13 DIAGNOSIS — I429 Cardiomyopathy, unspecified: Secondary | ICD-10-CM | POA: Diagnosis not present

## 2024-06-13 DIAGNOSIS — J9621 Acute and chronic respiratory failure with hypoxia: Secondary | ICD-10-CM | POA: Diagnosis not present

## 2024-06-13 DIAGNOSIS — I480 Paroxysmal atrial fibrillation: Secondary | ICD-10-CM | POA: Diagnosis not present

## 2024-06-13 DIAGNOSIS — E785 Hyperlipidemia, unspecified: Secondary | ICD-10-CM | POA: Diagnosis not present

## 2024-06-13 DIAGNOSIS — R1314 Dysphagia, pharyngoesophageal phase: Secondary | ICD-10-CM | POA: Diagnosis not present

## 2024-06-13 DIAGNOSIS — F17210 Nicotine dependence, cigarettes, uncomplicated: Secondary | ICD-10-CM | POA: Diagnosis not present

## 2024-06-13 DIAGNOSIS — K760 Fatty (change of) liver, not elsewhere classified: Secondary | ICD-10-CM | POA: Diagnosis not present

## 2024-06-13 DIAGNOSIS — E042 Nontoxic multinodular goiter: Secondary | ICD-10-CM | POA: Diagnosis not present

## 2024-06-13 DIAGNOSIS — E1122 Type 2 diabetes mellitus with diabetic chronic kidney disease: Secondary | ICD-10-CM | POA: Diagnosis not present

## 2024-06-13 DIAGNOSIS — E1143 Type 2 diabetes mellitus with diabetic autonomic (poly)neuropathy: Secondary | ICD-10-CM | POA: Diagnosis not present

## 2024-06-13 DIAGNOSIS — I083 Combined rheumatic disorders of mitral, aortic and tricuspid valves: Secondary | ICD-10-CM | POA: Diagnosis not present

## 2024-06-13 DIAGNOSIS — M48061 Spinal stenosis, lumbar region without neurogenic claudication: Secondary | ICD-10-CM | POA: Diagnosis not present

## 2024-06-13 DIAGNOSIS — R918 Other nonspecific abnormal finding of lung field: Secondary | ICD-10-CM | POA: Diagnosis not present

## 2024-06-13 DIAGNOSIS — L308 Other specified dermatitis: Secondary | ICD-10-CM | POA: Diagnosis not present

## 2024-06-13 DIAGNOSIS — L2089 Other atopic dermatitis: Secondary | ICD-10-CM | POA: Diagnosis not present

## 2024-06-13 DIAGNOSIS — N184 Chronic kidney disease, stage 4 (severe): Secondary | ICD-10-CM | POA: Diagnosis not present

## 2024-06-13 DIAGNOSIS — K3184 Gastroparesis: Secondary | ICD-10-CM | POA: Diagnosis not present

## 2024-06-13 DIAGNOSIS — I130 Hypertensive heart and chronic kidney disease with heart failure and stage 1 through stage 4 chronic kidney disease, or unspecified chronic kidney disease: Secondary | ICD-10-CM | POA: Diagnosis not present

## 2024-06-13 DIAGNOSIS — I5042 Chronic combined systolic (congestive) and diastolic (congestive) heart failure: Secondary | ICD-10-CM | POA: Diagnosis not present

## 2024-06-19 ENCOUNTER — Ambulatory Visit

## 2024-06-19 VITALS — BP 144/81 | HR 95 | Temp 97.5°F | Ht 62.0 in | Wt 173.8 lb

## 2024-06-19 DIAGNOSIS — Z79899 Other long term (current) drug therapy: Secondary | ICD-10-CM | POA: Diagnosis not present

## 2024-06-19 DIAGNOSIS — I5022 Chronic systolic (congestive) heart failure: Secondary | ICD-10-CM

## 2024-06-19 DIAGNOSIS — Z7985 Long-term (current) use of injectable non-insulin antidiabetic drugs: Secondary | ICD-10-CM | POA: Diagnosis not present

## 2024-06-19 DIAGNOSIS — F17210 Nicotine dependence, cigarettes, uncomplicated: Secondary | ICD-10-CM | POA: Diagnosis not present

## 2024-06-19 DIAGNOSIS — Z794 Long term (current) use of insulin: Secondary | ICD-10-CM | POA: Diagnosis not present

## 2024-06-19 DIAGNOSIS — E1165 Type 2 diabetes mellitus with hyperglycemia: Secondary | ICD-10-CM | POA: Diagnosis not present

## 2024-06-19 DIAGNOSIS — F17200 Nicotine dependence, unspecified, uncomplicated: Secondary | ICD-10-CM

## 2024-06-19 MED ORDER — OZEMPIC (2 MG/DOSE) 8 MG/3ML ~~LOC~~ SOPN: 2.0000 mg | PEN_INJECTOR | SUBCUTANEOUS | 2 refills | Status: AC

## 2024-06-19 NOTE — Assessment & Plan Note (Signed)
 She is still smoking and not interested in quitting. She smokes about 2 packs a week. She doesn't know why she smokes she thinks she does it when she is just sitting around not doing anything. She has friends that also smoke. Discussed the implications smoking has on her health. Discussed the discrepancy between her effort to come to doctors appointments / take medications and her tobacco use disorder. Please follow up and continue to stress importance.  Spent 5 minutes discussing.

## 2024-06-19 NOTE — Patient Instructions (Signed)
 It was wonderful seeing you today!   1) Start injecting 2 mg semaglutide  weekly. We are increasing the dose from 1 mg, you may do the 1 mg injection until you're able to get the new dose.   2) Continue your other medications as prescribed. Please let us  know if you are requiring torsemide  everyday, call the clinic.   3) Please try to cut down on smoking, it will help your health in every way.   4) Please call your dermatologist.   5) I will call you about your labs and let you know if we need to change anything.   If you have any questions please feel free to the call the clinic at anytime at [phone].  Have a blessed day,  Dr. Charmayne

## 2024-06-19 NOTE — Progress Notes (Signed)
 Established Patient Office Visit  Subjective   Patient ID: [name], female    DOB: [birth year]  Age: 73 y.o. MRN: [id number]  Patient seen with Dr. Trudy .      Patient here for two week follow up regarding her labs, fluid status, and DM from 9/3. Patient doing well and has no complaints.         Objective:     BP (!) 144/81 (BP Location: Left Arm, Patient Position: Sitting, Cuff Size: Large)   Pulse 95   Temp (!) 97.5 F (36.4 C)   Ht 5' 2 (1.575 m)   Wt 173 lb 12.8 oz (78.8 kg)   SpO2 94%   BMI 31.79 kg/m    Physical Exam Vitals reviewed.  Constitutional:      Appearance: Normal appearance.  HENT:     Nose: Nose normal.  Eyes:     Conjunctiva/sclera: Conjunctivae normal.  Cardiovascular:     Rate and Rhythm: Normal rate and regular rhythm.     Pulses: Normal pulses.     Heart sounds: Normal heart sounds.  Pulmonary:     Effort: Pulmonary effort is normal.  Musculoskeletal:     Right lower leg: Edema present.     Left lower leg: Edema present.  Skin:    General: Skin is warm.     Comments: Diffuse atopic dermatitis, BL UE, BL LE, back, neck  Neurological:     General: No focal deficit present.     Mental Status: She is alert and oriented to person, place, and time.  Psychiatric:        Mood and Affect: Mood normal.        Behavior: Behavior normal.      No results found for any visits on 06/19/24.    The ASCVD Risk score (Arnett DK, et al., 2019) failed to calculate for the following reasons:   Risk score cannot be calculated because patient has a medical history suggesting prior/existing ASCVD    Assessment & Plan:   Assessment & Plan Uncontrolled type 2 diabetes mellitus with hyperglycemia (HCC) A1c in June 7.5. Current regimen includes ozempic  1 mg weekly and degludec 5 units. Patient did bring her Dexcom today and we were able to review her data. Patient has 0% lows, about 60% high or very high, and about 40% in range. Her  glucose spikes around 4, she says this is when she has her earlier dinner. She doesn't eat a lot, typically one apply, one plum, and one banana a day, and a sandwich with deli meat for early dinner. We talked about fruits having a lot of sugar and to not eat them excessively. Plan to increase ozempic  to 2 mg weekly and continue degludec 5 units. I prefer this over increasing her insulin  at this time, as her weight has been stable for about a year and BMI 31.79. Can consider increasing degludec in future or adding back SGLT2i. Patient has appt with Arland scheduled for 9/25. Will reassess once we get BMP back today. Also due for A1c.  Orders:   Basic metabolic panel with GFR   Hemoglobin A1c   Semaglutide , 2 MG/DOSE, (OZEMPIC , 2 MG/DOSE,) 8 MG/3ML SOPN; Inject 2 mg into the skin once a week.  Chronic systolic CHF (congestive heart failure) (HCC) Since last visit two weeks ago, current regimen includes bisoprolol  2.5 mg daily, spironolactone  12.5 mg daily, and torsemide  20 mg as needed. Previously patient was taking torsemide  everyday because her feet were  swelling and causing pain not allowing her to walk. BMP checked last visit and Cr 1.88 (3 weeks prior was 1.4). I asked patient to try and only take the torsemide  as needed, at most, every other day. Today she reports she has only taken it 4 times since our last visit. She is breathing well today and feels good. Her feet are swollen but her weight today is 173, dry weight is 175. Her feet are not causing her any pain and haven't been lately. Plan to recheck kidney function with BMP today. If her Cr still elevated/ GFR low with only 4 days of torsemide , will change spironolactone  and can consider adding back her dapaglifozin. If AKI resolves, continue current regimen and reassess at 4 week follow up.  Orders:   Basic metabolic panel with GFR  Tobacco use disorder She is still smoking and not interested in quitting. She smokes about 2 packs a week. She  doesn't know why she smokes she thinks she does it when she is just sitting around not doing anything. She has friends that also smoke. Discussed the implications smoking has on her health. Discussed the discrepancy between her effort to come to doctors appointments / take medications and her tobacco use disorder. Please follow up and continue to stress importance.  Spent 5 minutes discussing.      Return in about 4 weeks (around 07/17/2024) for 4 weeks follow up.    Viktoria King, DO

## 2024-06-20 ENCOUNTER — Ambulatory Visit: Payer: Self-pay

## 2024-06-20 DIAGNOSIS — L308 Other specified dermatitis: Secondary | ICD-10-CM | POA: Diagnosis not present

## 2024-06-20 DIAGNOSIS — F17210 Nicotine dependence, cigarettes, uncomplicated: Secondary | ICD-10-CM | POA: Diagnosis not present

## 2024-06-20 DIAGNOSIS — I083 Combined rheumatic disorders of mitral, aortic and tricuspid valves: Secondary | ICD-10-CM | POA: Diagnosis not present

## 2024-06-20 DIAGNOSIS — E1143 Type 2 diabetes mellitus with diabetic autonomic (poly)neuropathy: Secondary | ICD-10-CM | POA: Diagnosis not present

## 2024-06-20 DIAGNOSIS — E1122 Type 2 diabetes mellitus with diabetic chronic kidney disease: Secondary | ICD-10-CM | POA: Diagnosis not present

## 2024-06-20 DIAGNOSIS — R918 Other nonspecific abnormal finding of lung field: Secondary | ICD-10-CM | POA: Diagnosis not present

## 2024-06-20 DIAGNOSIS — I130 Hypertensive heart and chronic kidney disease with heart failure and stage 1 through stage 4 chronic kidney disease, or unspecified chronic kidney disease: Secondary | ICD-10-CM | POA: Diagnosis not present

## 2024-06-20 DIAGNOSIS — I480 Paroxysmal atrial fibrillation: Secondary | ICD-10-CM | POA: Diagnosis not present

## 2024-06-20 DIAGNOSIS — I4892 Unspecified atrial flutter: Secondary | ICD-10-CM | POA: Diagnosis not present

## 2024-06-20 DIAGNOSIS — N184 Chronic kidney disease, stage 4 (severe): Secondary | ICD-10-CM | POA: Diagnosis not present

## 2024-06-20 DIAGNOSIS — E042 Nontoxic multinodular goiter: Secondary | ICD-10-CM | POA: Diagnosis not present

## 2024-06-20 DIAGNOSIS — L2089 Other atopic dermatitis: Secondary | ICD-10-CM | POA: Diagnosis not present

## 2024-06-20 DIAGNOSIS — M48061 Spinal stenosis, lumbar region without neurogenic claudication: Secondary | ICD-10-CM | POA: Diagnosis not present

## 2024-06-20 DIAGNOSIS — R1314 Dysphagia, pharyngoesophageal phase: Secondary | ICD-10-CM | POA: Diagnosis not present

## 2024-06-20 DIAGNOSIS — N179 Acute kidney failure, unspecified: Secondary | ICD-10-CM | POA: Diagnosis not present

## 2024-06-20 DIAGNOSIS — J9621 Acute and chronic respiratory failure with hypoxia: Secondary | ICD-10-CM | POA: Diagnosis not present

## 2024-06-20 DIAGNOSIS — M810 Age-related osteoporosis without current pathological fracture: Secondary | ICD-10-CM | POA: Diagnosis not present

## 2024-06-20 DIAGNOSIS — I5042 Chronic combined systolic (congestive) and diastolic (congestive) heart failure: Secondary | ICD-10-CM | POA: Diagnosis not present

## 2024-06-20 DIAGNOSIS — K3184 Gastroparesis: Secondary | ICD-10-CM | POA: Diagnosis not present

## 2024-06-20 DIAGNOSIS — K760 Fatty (change of) liver, not elsewhere classified: Secondary | ICD-10-CM | POA: Diagnosis not present

## 2024-06-20 DIAGNOSIS — J449 Chronic obstructive pulmonary disease, unspecified: Secondary | ICD-10-CM | POA: Diagnosis not present

## 2024-06-20 DIAGNOSIS — I429 Cardiomyopathy, unspecified: Secondary | ICD-10-CM | POA: Diagnosis not present

## 2024-06-20 DIAGNOSIS — E785 Hyperlipidemia, unspecified: Secondary | ICD-10-CM | POA: Diagnosis not present

## 2024-06-20 LAB — BASIC METABOLIC PANEL WITH GFR
BUN/Creatinine Ratio: 23 (ref 12–28)
BUN: 31 mg/dL — ABNORMAL HIGH (ref 8–27)
CO2: 19 mmol/L — ABNORMAL LOW (ref 20–29)
Calcium: 9.7 mg/dL (ref 8.7–10.3)
Chloride: 107 mmol/L — ABNORMAL HIGH (ref 96–106)
Creatinine, Ser: 1.34 mg/dL — ABNORMAL HIGH (ref 0.57–1.00)
Glucose: 121 mg/dL — ABNORMAL HIGH (ref 70–99)
Potassium: 4.3 mmol/L (ref 3.5–5.2)
Sodium: 144 mmol/L (ref 134–144)
eGFR: 43 mL/min/1.73 — ABNORMAL LOW (ref >59)

## 2024-06-20 LAB — HEMOGLOBIN A1C
Est. average glucose Bld gHb Est-mCnc: 200 mg/dL
Hgb A1c MFr Bld: 8.6 % — ABNORMAL HIGH (ref 4.8–5.6)

## 2024-06-24 ENCOUNTER — Encounter (HOSPITAL_COMMUNITY): Payer: Self-pay

## 2024-06-24 ENCOUNTER — Other Ambulatory Visit: Payer: Self-pay

## 2024-06-24 ENCOUNTER — Emergency Department (HOSPITAL_COMMUNITY)
Admission: EM | Admit: 2024-06-24 | Discharge: 2024-06-25 | Disposition: A | Discharge status: Home / Self Care | Attending: Emergency Medicine | Admitting: Emergency Medicine

## 2024-06-24 ENCOUNTER — Emergency Department (HOSPITAL_COMMUNITY)

## 2024-06-24 DIAGNOSIS — R6889 Other general symptoms and signs: Secondary | ICD-10-CM | POA: Diagnosis not present

## 2024-06-24 DIAGNOSIS — Z7901 Long term (current) use of anticoagulants: Secondary | ICD-10-CM | POA: Diagnosis not present

## 2024-06-24 DIAGNOSIS — R0602 Shortness of breath: Secondary | ICD-10-CM | POA: Diagnosis not present

## 2024-06-24 DIAGNOSIS — R918 Other nonspecific abnormal finding of lung field: Secondary | ICD-10-CM | POA: Diagnosis not present

## 2024-06-24 DIAGNOSIS — J441 Chronic obstructive pulmonary disease with (acute) exacerbation: Secondary | ICD-10-CM | POA: Insufficient documentation

## 2024-06-24 DIAGNOSIS — Z743 Need for continuous supervision: Secondary | ICD-10-CM | POA: Diagnosis not present

## 2024-06-24 DIAGNOSIS — J449 Chronic obstructive pulmonary disease, unspecified: Secondary | ICD-10-CM | POA: Diagnosis not present

## 2024-06-24 DIAGNOSIS — E1122 Type 2 diabetes mellitus with diabetic chronic kidney disease: Secondary | ICD-10-CM | POA: Diagnosis not present

## 2024-06-24 DIAGNOSIS — D72829 Elevated white blood cell count, unspecified: Secondary | ICD-10-CM | POA: Diagnosis not present

## 2024-06-24 DIAGNOSIS — I509 Heart failure, unspecified: Secondary | ICD-10-CM | POA: Insufficient documentation

## 2024-06-24 DIAGNOSIS — Z794 Long term (current) use of insulin: Secondary | ICD-10-CM | POA: Diagnosis not present

## 2024-06-24 DIAGNOSIS — J811 Chronic pulmonary edema: Secondary | ICD-10-CM | POA: Diagnosis not present

## 2024-06-24 DIAGNOSIS — R069 Unspecified abnormalities of breathing: Secondary | ICD-10-CM | POA: Diagnosis not present

## 2024-06-24 DIAGNOSIS — N189 Chronic kidney disease, unspecified: Secondary | ICD-10-CM | POA: Diagnosis not present

## 2024-06-24 DIAGNOSIS — I499 Cardiac arrhythmia, unspecified: Secondary | ICD-10-CM | POA: Diagnosis not present

## 2024-06-24 LAB — CBC WITH DIFFERENTIAL/PLATELET
Abs Immature Granulocytes: 0.07 K/uL (ref 0.00–0.07)
Basophils Absolute: 0.1 K/uL (ref 0.0–0.1)
Basophils Relative: 1 %
Eosinophils Absolute: 0.5 K/uL (ref 0.0–0.5)
Eosinophils Relative: 4 %
HCT: 37.1 % (ref 36.0–46.0)
Hemoglobin: 11.1 g/dL — ABNORMAL LOW (ref 12.0–15.0)
Immature Granulocytes: 1 %
Lymphocytes Relative: 14 %
Lymphs Abs: 1.7 K/uL (ref 0.7–4.0)
MCH: 25.8 pg — ABNORMAL LOW (ref 26.0–34.0)
MCHC: 29.9 g/dL — ABNORMAL LOW (ref 30.0–36.0)
MCV: 86.3 fL (ref 80.0–100.0)
Monocytes Absolute: 0.9 K/uL (ref 0.1–1.0)
Monocytes Relative: 7 %
Neutro Abs: 9.2 K/uL — ABNORMAL HIGH (ref 1.7–7.7)
Neutrophils Relative %: 73 %
Platelets: 309 K/uL (ref 150–400)
RBC: 4.3 MIL/uL (ref 3.87–5.11)
RDW: 18.7 % — ABNORMAL HIGH (ref 11.5–15.5)
WBC: 12.3 K/uL — ABNORMAL HIGH (ref 4.0–10.5)
nRBC: 0 % (ref 0.0–0.2)

## 2024-06-24 LAB — COMPREHENSIVE METABOLIC PANEL WITH GFR
ALT: 13 U/L (ref 0–44)
AST: 21 U/L (ref 15–41)
Albumin: 3.6 g/dL (ref 3.5–5.0)
Alkaline Phosphatase: 134 U/L — ABNORMAL HIGH (ref 38–126)
Anion gap: 16 — ABNORMAL HIGH (ref 5–15)
BUN: 15 mg/dL (ref 8–23)
CO2: 20 mmol/L — ABNORMAL LOW (ref 22–32)
Calcium: 9.1 mg/dL (ref 8.9–10.3)
Chloride: 109 mmol/L (ref 98–111)
Creatinine, Ser: 1.37 mg/dL — ABNORMAL HIGH (ref 0.44–1.00)
GFR, Estimated: 42 mL/min — ABNORMAL LOW (ref >60)
Glucose, Bld: 140 mg/dL — ABNORMAL HIGH (ref 70–99)
Potassium: 3.7 mmol/L (ref 3.5–5.1)
Sodium: 145 mmol/L (ref 135–145)
Total Bilirubin: 1 mg/dL (ref 0.0–1.2)
Total Protein: 7.5 g/dL (ref 6.5–8.1)

## 2024-06-24 LAB — RESP PANEL BY RT-PCR (RSV, FLU A&B, COVID)  RVPGX2
Influenza A by PCR: NEGATIVE
Influenza B by PCR: NEGATIVE
Resp Syncytial Virus by PCR: NEGATIVE
SARS Coronavirus 2 by RT PCR: NEGATIVE

## 2024-06-24 MED ORDER — PREDNISONE 50 MG PO TABS: 50.0000 mg | ORAL_TABLET | Freq: Every day | ORAL | 0 refills | Status: AC

## 2024-06-24 MED ORDER — DIPHENHYDRAMINE HCL 50 MG/ML IJ SOLN
12.5000 mg | Freq: Once | INTRAMUSCULAR | Status: AC
  Administered 2024-06-24: 12.5 mg via INTRAVENOUS
  Filled 2024-06-24: qty 1

## 2024-06-24 MED ORDER — DOXYCYCLINE HYCLATE 100 MG PO CAPS: 100.0000 mg | ORAL_CAPSULE | Freq: Two times a day (BID) | ORAL | 0 refills | Status: AC

## 2024-06-24 MED ORDER — METHYLPREDNISOLONE SODIUM SUCC 125 MG IJ SOLR
125.0000 mg | Freq: Once | INTRAMUSCULAR | Status: AC
  Administered 2024-06-24: 125 mg via INTRAVENOUS
  Filled 2024-06-24: qty 2

## 2024-06-24 MED ORDER — SODIUM CHLORIDE 0.9 % IV SOLN
500.0000 mg | Freq: Once | INTRAVENOUS | Status: AC
  Administered 2024-06-24: 500 mg via INTRAVENOUS
  Filled 2024-06-24: qty 5

## 2024-06-24 MED ORDER — SODIUM CHLORIDE 0.9 % IV SOLN
1.0000 g | Freq: Once | INTRAVENOUS | Status: AC
  Administered 2024-06-24: 1 g via INTRAVENOUS
  Filled 2024-06-24: qty 10

## 2024-06-24 NOTE — Discharge Instructions (Signed)
 You were seen in the emergency room for shortness of breath Your breathing felt better after steroids and breathing treatments We have called in a couple more days of steroids for you to pick up from your pharmacy begin taking as directed to help with COPD exacerbation Remember that he will need to keep a close watch on your sugars while on steroids as this can cause your sugars to be elevated We have also called in a prescription for doxycycline  for you to pick up in case there is an infection in your lungs as well Is important that you follow-up with your primary doctor Continue using all of your previous prescribed medications Return to the emergency room for worsening breathing or any other concerns

## 2024-06-24 NOTE — ED Notes (Signed)
 CCMD called to place the patient on cardiac monitoring services.

## 2024-06-24 NOTE — ED Provider Notes (Signed)
 [place] EMERGENCY DEPARTMENT AT [facility] Provider Note   CSN: [id number] Arrival date & time: 06/24/24  1747     Patient presents with: Shortness of Breath   [name] is a 73 y.o. female.With a history of COPD, type 2 diabetes, CKD heart failure and status post ICD who presents the ED for shortness of breath.  Shortness of breath started yesterday and has worsened through today.  She is on 2 L at home and has not required additional oxygen  support.  Felt better after 2 DuoNeb treatments from EMS.  She did report fevers 2 days ago.  No chest pain nausea vomiting    Shortness of Breath      Prior to Admission medications   Medication Sig Start Date End Date Taking? Authorizing Provider  doxycycline  (VIBRAMYCIN ) 100 MG capsule Take 1 capsule (100 mg total) by mouth 2 (two) times daily for 5 days. 06/24/24 06/29/24 Yes Pamella Ozell LABOR, DO  predniSONE  (DELTASONE ) 50 MG tablet Take 1 tablet (50 mg total) by mouth daily with breakfast for 3 days. 06/24/24 06/27/24 Yes Pamella Ozell LABOR, DO  acetaminophen  (TYLENOL ) 500 MG tablet Take 2 tablets (1,000 mg total) by mouth every 8 (eight) hours as needed for mild pain (pain score 1-3). 05/13/24   Leontine Lapine, MD  albuterol  (VENTOLIN  HFA) 108 [phone] Base) MCG/ACT inhaler Inhale 2 puffs into the lungs every 6 (six) hours as needed for wheezing or shortness of breath. 07/20/23   [name], MD  apixaban  (ELIQUIS ) 5 MG TABS tablet Take 1 tablet (5 mg total) by mouth 2 (two) times daily. 11/10/23 08/02/24  Bensimhon, Toribio SAUNDERS, MD  atorvastatin  (LIPITOR ) 80 MG tablet Take 1 tablet (80 mg total) by mouth at bedtime. 03/26/24   Zheng, Rivky Clendenning, DO  bisoprolol  (ZEBETA ) 5 MG tablet Take 0.5 tablets (2.5 mg total) by mouth daily. 06/06/24   Charmayne Holmes, DO  calcium -vitamin D  (OSCAL WITH D) 500-200 MG-UNIT tablet Take 1 tablet by mouth 2 (two) times daily. Patient not taking: Reported on 05/30/2024 02/12/20   Lovey Satterfield, MD  Continuous  Glucose Sensor (DEXCOM G7 SENSOR) MISC Inject 1 Application into the skin. Every 10 days 04/16/24   [provider]  dapagliflozin  propanediol (FARXIGA ) 10 MG TABS tablet Take 1 tablet (10 mg total) by mouth daily. Patient not taking: Reported on 06/11/2024 08/29/23   Amoako, Prince, MD  insulin  degludec (TRESIBA ) 100 UNIT/ML FlexTouch Pen Inject 5 Units into the skin daily. 04/17/24   D'Mello, Rosalyn, DO  ketoconazole  (NIZORAL ) 2 % shampoo Apply 1 Application topically once a week. 10/16/23   [provider]  levocetirizine (XYZAL ) 5 MG tablet Take 1 tablet (5 mg total) by mouth at bedtime. 03/25/24   Zheng, Riti Rollyson, DO  OXYGEN  Inhale 2 L into the lungs at bedtime.    [provider]  Semaglutide , 2 MG/DOSE, (OZEMPIC , 2 MG/DOSE,) 8 MG/3ML SOPN Inject 2 mg into the skin once a week. 06/19/24   Lyles, Holmes, DO  senna-docusate (SENOKOT-S) 8.6-50 MG tablet Take 1 tablet by mouth at bedtime as needed for mild constipation. 05/13/24   Nguyen, Diana, MD  spironolactone  (ALDACTONE ) 25 MG tablet Take 0.5 tablets (12.5 mg total) by mouth daily. 06/05/24   Charmayne Holmes, DO  torsemide  (DEMADEX ) 20 MG tablet Take 1 tablet by mouth as needed, take a dose tomorrow 05/22/24 and Fri 05/24/24 if still needed. 05/21/24   Lesia Ozell Barter, PA-C  triamcinolone  ointment (KENALOG ) 0.1 % Apply 1 Application topically 2 (  two) times daily. Mix with Eucerin cream 06/05/24   Lyles, Viktoria, DO  umeclidinium-vilanterol (ANORO ELLIPTA ) 62.5-25 MCG/ACT AEPB Inhale 1 puff into the lungs daily. 06/05/24   [name], DO    Allergies: Actos [pioglitazone], Naproxen , Rosiglitazone, Metformin  and related, Tomato, Hydrocodone -acetaminophen , and Hydrocortisone    Review of Systems  Respiratory:  Positive for shortness of breath.     Updated Vital Signs BP (!) 169/88   Pulse (!) 114   Temp 98.3 F (36.8 C) (Axillary)   Resp (!) 21   SpO2 97%   Physical Exam Vitals and nursing note reviewed.   HENT:     Head: Normocephalic and atraumatic.  Eyes:     Pupils: Pupils are equal, round, and reactive to light.  Cardiovascular:     Rate and Rhythm: Normal rate and regular rhythm.  Pulmonary:     Effort: Pulmonary effort is normal.     Breath sounds: Examination of the right-upper field reveals wheezing. Examination of the left-upper field reveals wheezing. Wheezing present.  Abdominal:     Palpations: Abdomen is soft.     Tenderness: There is no abdominal tenderness.  Skin:    General: Skin is warm and dry.  Neurological:     Mental Status: She is alert.  Psychiatric:        Mood and Affect: Mood normal.     (all labs ordered are listed, but only abnormal results are displayed) Labs Reviewed  COMPREHENSIVE METABOLIC PANEL WITH GFR - Abnormal; Notable for the following components:      Result Value   CO2 20 (*)    Glucose, Bld 140 (*)    Creatinine, Ser 1.37 (*)    Alkaline Phosphatase 134 (*)    GFR, Estimated 42 (*)    Anion gap 16 (*)    All other components within normal limits  CBC WITH DIFFERENTIAL/PLATELET - Abnormal; Notable for the following components:   WBC 12.3 (*)    Hemoglobin 11.1 (*)    MCH 25.8 (*)    MCHC 29.9 (*)    RDW 18.7 (*)    Neutro Abs 9.2 (*)    All other components within normal limits  RESP PANEL BY RT-PCR (RSV, FLU A&B, COVID)  [id number]    EKG: None  Radiology: DG Chest Portable 1 View Result Date: 06/24/2024 CLINICAL DATA:  COPD. Difficulty breathing. Shortness of breath starting today. EXAM: PORTABLE CHEST 1 VIEW COMPARISON:  05/07/2024 FINDINGS: Cardiac pacemaker. Shallow inspiration. Cardiac enlargement. Central interstitial pattern to the lungs with peribronchial thickening and streaky perihilar opacities progressing since previous study. This may indicate progressing bronchitic changes possibly due to acute bronchitis or interstitial pneumonitis. No pleural effusion or pneumothorax. Mediastinal contours appear intact.  Calcification of the aorta. Degenerative changes in the spine and shoulders. IMPRESSION: Developing bronchitic changes and interstitial infiltrates may represent progressing acute bronchitis or interstitial pneumonitis. Interstitial edema would be a secondary consideration. Cardiac enlargement. Electronically Signed   By: Elsie Gravely M.D.   On: 06/24/2024 18:33     Procedures   Medications Ordered in the ED  azithromycin  (ZITHROMAX ) 500 mg in sodium chloride  0.9 % 250 mL IVPB (500 mg Intravenous New Bag/Given 06/24/24 2207)  methylPREDNISolone  sodium succinate (SOLU-MEDROL ) 125 mg/2 mL injection 125 mg (125 mg Intravenous Given 06/24/24 1930)  diphenhydrAMINE  (BENADRYL ) injection 12.5 mg (12.5 mg Intravenous Given 06/24/24 1929)  cefTRIAXone  (ROCEPHIN ) 1 g in sodium chloride  0.9 % 100 mL IVPB (0 g Intravenous Stopped 06/24/24 2132)    Clinical  Course as of 06/24/24 2235  Mon Jun 24, 2024  2231 Labs are work notable for leukocytosis 12.3.  Patient feels much better after another DuoNeb treatment here.  Ceftriaxone  and azithromycin  given for community-acquired pneumonia.  Moving better air.  Stable on her baseline 2 L.  COVID flu RSV all negative.  Chest x-ray may show some new infiltrates  I spoke with patient about rationale for admission versus discharge home with close PCP follow-up At this time she wants to proceed with discharge and PCP follow-up considering her breathing is much better Her breathing does look a lot better here and I am okay with this plan but will discharge her with a short course of prednisone  and antibiotic coverage for COPD exacerbation She understands she needs to keep a close watch on her blood sugars with the steroids [MP]    Clinical Course User Index [MP] Pamella Ozell LABOR, DO                                 Medical Decision Making 73 year old female with history as above presented to ED for shortness of breath.  Some wheezing on exam.  Felt better after 2  DuoNeb treatments from EMS.  Will give a third neb treatment here along with steroids and reevaluate.  Differential diagnosis includes COPD exacerbation pneumonia viral respiratory illness heart failure exacerbation.  Low suspicion for ACS at this time given lack of chest pain.  Will continue to monitor respiratory status and symptoms closely  Amount and/or Complexity of Data Reviewed Labs: ordered. Radiology: ordered.  Risk Prescription drug management.        Final diagnoses:  COPD exacerbation [facility])    ED Discharge Orders          Ordered    predniSONE  (DELTASONE ) 50 MG tablet  Daily with breakfast        06/24/24 2234    doxycycline  (VIBRAMYCIN ) 100 MG capsule  2 times daily        06/24/24 2234               Pamella Ozell LABOR, DO 06/24/24 2235

## 2024-06-24 NOTE — ED Triage Notes (Signed)
 PT BIB [facility] from home with c/o SOB that started today. Denies chest pain. Hx of COPD. ON chronic 2L at home.   EMS- 2 duoneb treatment.

## 2024-06-25 ENCOUNTER — Telehealth: Payer: Self-pay

## 2024-06-25 NOTE — Patient Outreach (Signed)
 Care Coordination   06/25/2024 Name: BRYNLEE PENNYWELL MRN: [id number] DOB: 06-24-1951   Care Coordination Outreach Attempts:  An unsuccessful outreach was attempted for an appointment today.  Follow Up Plan:  Additional outreach attempts will be made to complete CCM follow-up visit.   Encounter Outcome:  No Answer. HIPAA compliant voicemail left requesting return call.   Rosaline Finlay, RN MSN [place]  [facility] RN Care Manager Direct Dial: [phone]  Fax: [phone]

## 2024-06-26 DIAGNOSIS — N184 Chronic kidney disease, stage 4 (severe): Secondary | ICD-10-CM | POA: Diagnosis not present

## 2024-06-26 DIAGNOSIS — E1143 Type 2 diabetes mellitus with diabetic autonomic (poly)neuropathy: Secondary | ICD-10-CM | POA: Diagnosis not present

## 2024-06-26 DIAGNOSIS — I480 Paroxysmal atrial fibrillation: Secondary | ICD-10-CM | POA: Diagnosis not present

## 2024-06-26 DIAGNOSIS — I4892 Unspecified atrial flutter: Secondary | ICD-10-CM | POA: Diagnosis not present

## 2024-06-26 DIAGNOSIS — M48061 Spinal stenosis, lumbar region without neurogenic claudication: Secondary | ICD-10-CM | POA: Diagnosis not present

## 2024-06-26 DIAGNOSIS — Z7901 Long term (current) use of anticoagulants: Secondary | ICD-10-CM | POA: Diagnosis not present

## 2024-06-26 DIAGNOSIS — K760 Fatty (change of) liver, not elsewhere classified: Secondary | ICD-10-CM | POA: Diagnosis not present

## 2024-06-26 DIAGNOSIS — E1122 Type 2 diabetes mellitus with diabetic chronic kidney disease: Secondary | ICD-10-CM | POA: Diagnosis not present

## 2024-06-26 DIAGNOSIS — E785 Hyperlipidemia, unspecified: Secondary | ICD-10-CM | POA: Diagnosis not present

## 2024-06-26 DIAGNOSIS — J449 Chronic obstructive pulmonary disease, unspecified: Secondary | ICD-10-CM | POA: Diagnosis not present

## 2024-06-26 DIAGNOSIS — I130 Hypertensive heart and chronic kidney disease with heart failure and stage 1 through stage 4 chronic kidney disease, or unspecified chronic kidney disease: Secondary | ICD-10-CM | POA: Diagnosis not present

## 2024-06-26 DIAGNOSIS — K3184 Gastroparesis: Secondary | ICD-10-CM | POA: Diagnosis not present

## 2024-06-26 DIAGNOSIS — I5042 Chronic combined systolic (congestive) and diastolic (congestive) heart failure: Secondary | ICD-10-CM | POA: Diagnosis not present

## 2024-06-26 DIAGNOSIS — R918 Other nonspecific abnormal finding of lung field: Secondary | ICD-10-CM | POA: Diagnosis not present

## 2024-06-26 DIAGNOSIS — I083 Combined rheumatic disorders of mitral, aortic and tricuspid valves: Secondary | ICD-10-CM | POA: Diagnosis not present

## 2024-06-26 DIAGNOSIS — J9611 Chronic respiratory failure with hypoxia: Secondary | ICD-10-CM | POA: Diagnosis not present

## 2024-06-26 DIAGNOSIS — Z7984 Long term (current) use of oral hypoglycemic drugs: Secondary | ICD-10-CM | POA: Diagnosis not present

## 2024-06-26 DIAGNOSIS — R1314 Dysphagia, pharyngoesophageal phase: Secondary | ICD-10-CM | POA: Diagnosis not present

## 2024-06-26 DIAGNOSIS — I429 Cardiomyopathy, unspecified: Secondary | ICD-10-CM | POA: Diagnosis not present

## 2024-06-26 DIAGNOSIS — E042 Nontoxic multinodular goiter: Secondary | ICD-10-CM | POA: Diagnosis not present

## 2024-06-26 DIAGNOSIS — F17210 Nicotine dependence, cigarettes, uncomplicated: Secondary | ICD-10-CM | POA: Diagnosis not present

## 2024-06-26 DIAGNOSIS — M810 Age-related osteoporosis without current pathological fracture: Secondary | ICD-10-CM | POA: Diagnosis not present

## 2024-06-26 NOTE — Patient Instructions (Signed)
 [name] - I am sorry I was unable to reach you 06/25/24 for our scheduled appointment. I work with Amoako, Prince, MD and am calling to support your healthcare needs. Please contact me at [phone] at your earliest convenience. I look forward to speaking with you soon.   Thank you,  Rosaline Finlay, RN MSN [place]  [facility] RN Care Manager Direct Dial: [phone]  Fax: [phone]

## 2024-06-26 NOTE — Patient Outreach (Signed)
 Care Coordination   06/26/2024 Name: [name] MRN: [id number] DOB: 22-Feb-1951   Care Coordination Outreach Attempts:  A second unsuccessful outreach was attempted today to complete CCM follow-up visit.  Follow Up Plan:  Additional outreach attempts will be made to complete follow-up visit.   Encounter Outcome:  No Answer. HIPAA compliant voicemail left requesting return call.    Rosaline Finlay, RN MSN [place]  [facility] Population [facility] RN Care Manager Direct Dial: [phone]  Fax: [phone]

## 2024-06-27 ENCOUNTER — Ambulatory Visit (HOSPITAL_COMMUNITY)

## 2024-06-28 NOTE — Patient Instructions (Addendum)
 Arland [name] - I have attempted to call you three times but have been unsuccessful in reaching you. I work with Amoako, Prince, MD and am calling to support your healthcare needs. If I can be of assistance to you, please contact me at [phone].     Thank you,  Rosaline Finlay, RN MSN [place]  [facility] RN Care Manager Direct Dial: [phone]  Fax: [phone]

## 2024-06-28 NOTE — Patient Outreach (Signed)
 Care Coordination   06/28/2024 Name: [name] MRN: [id number] DOB: 1950/10/13   Care Coordination Outreach Attempts:  A third unsuccessful outreach was attempted today to complete CCM follow-up visit.  Follow Up Plan:  No further outreach attempts will be made at this time. We have been unable to contact the patient to complete follow-up visit.  Encounter Outcome:  No Answer. HIPAA compliant voicemail left requesting return call.   Rosaline Finlay, RN MSN [place]  [facility] Population [facility] RN Care Manager Direct Dial: [phone]  Fax: [phone]

## 2024-06-29 DIAGNOSIS — I509 Heart failure, unspecified: Secondary | ICD-10-CM | POA: Diagnosis not present

## 2024-06-29 NOTE — Progress Notes (Signed)
Internal Medicine Clinic Attending  I was physically present during the key portions of the resident provided service and participated in the medical decision making of patient's management care. I reviewed pertinent patient test results.  The assessment, diagnosis, and plan were formulated together and I agree with the documentation in the resident's note.  Williams, Julie Anne, MD  

## 2024-07-02 ENCOUNTER — Telehealth: Payer: Self-pay | Admitting: *Deleted

## 2024-07-02 NOTE — Telephone Encounter (Signed)
 Mammogram appointment October 28.2025 @10 :30 am to arrive 10:15 am breast center . Appointment mailed to patient also attached is the information regarding the 75.00 no show fee. Patient was contacted with this information . Appointment mailed to the patient.

## 2024-07-04 ENCOUNTER — Other Ambulatory Visit (INDEPENDENT_AMBULATORY_CARE_PROVIDER_SITE_OTHER)

## 2024-07-04 DIAGNOSIS — Z7984 Long term (current) use of oral hypoglycemic drugs: Secondary | ICD-10-CM

## 2024-07-04 DIAGNOSIS — E1165 Type 2 diabetes mellitus with hyperglycemia: Secondary | ICD-10-CM

## 2024-07-04 DIAGNOSIS — Z794 Long term (current) use of insulin: Secondary | ICD-10-CM

## 2024-07-04 DIAGNOSIS — I509 Heart failure, unspecified: Secondary | ICD-10-CM

## 2024-07-04 DIAGNOSIS — E1129 Type 2 diabetes mellitus with other diabetic kidney complication: Secondary | ICD-10-CM

## 2024-07-04 DIAGNOSIS — I4892 Unspecified atrial flutter: Secondary | ICD-10-CM

## 2024-07-04 MED ORDER — DEXCOM G7 SENSOR MISC: 3 refills | Status: AC

## 2024-07-04 NOTE — Progress Notes (Signed)
 07/04/2024 Name: [name] MRN: [id number] DOB: 1951-09-01  Chief Complaint  Patient presents with   Congestive Heart Failure    [name] is a 73 y.o. year old female who presented for a telephone visit.   They were referred to the pharmacist by their PCP for assistance in managing complex medication management. PMH includes HTN, HFrEF (EF 20-25%) s/p ICD, atrial flutter, COPD, T2DM with gastroparesis, MASL, CKD4, HLD, BMI > 30, tobacco use.   Subjective: Patient was last seen by [name], MD, on 05/06/24. At last visit, BP was 109/58 mmHg, HR 89 bpm. Patient appeared to be volume overloaded in the setting of holding potassium, spironolactone , and torsemide  for AKI and hyperkalemia. She was restarted on torsemide  20 mg daily. Her labs returned with Scr further increased from 2.39 > 3.30, K 2.9 and she was directly admitted for IV diuresis. She was admitted from 05/07/24 to 05/13/24. She was found to have a UTI and Farxiga  was held during admission and at discharge. Her bisoprolol  and spironolactone  were dose-reduced. She was instructed to take torsemide  PRN.  She was discharged to SNF [facility]). She was initially scheduled for a telephone appt with pharmacy on 05/16/24, but this was reschedule since she was still residing at [place]. Patient was seen by cardiology, [name], PA on 05/21/24. Patient reported that she was taking torsemide  PRN for intermittent swelling, and was instructed to continue torsemide  20 mg PRN as she was hypervolemic on exam. No other changes were made to her medication regimen. She was engaged by pharmacy via telephone on 05/30/24. She reviewed all her medication bottles with me over the phone. Confirmed that she was receiving Ozempic  at the SNF and instructed her to resume. Communicated with PCP to consider restarting SGLT2i at f/u, if s/sx of UTI were resolved. She was seen by Dr. Charmayne on 06/05/24. Weight was stable, appeared euvolemic. Given soft BP (102/66  mmHg), did not restart SGLT2i. A1C had increased to 8.6%, and Scr was elevated to 1.88 mg/dL in the setting of taking torsemide  daily. She was seen again by Dr. Charmayne on 06/19/24. Scr had improved to 1.34 mg/dL with PRN use of torsemide . BP was 144/81 mmHg. She was wearing CGM and TIR was 40%, TAR was 60%. She was instructed to increase Ozempic  to 2 mg weekly. She was seen in the ED on 06/24/24 for ShOB, thought to  be a COPD exacerbation. She was treated with prednisone  and doxycycline .  Today, patient reports doing well. Her breathing has been better since she completed course of prednisone  and doxycycline . However, the prednisone  caused her BG to be elevated to the 300s last week. She is still finishing her Ozempic  1 mg pen, then plans to increase to Ozempic  2 mg weekly. She has only needed torsemide  once in the past week. She reports she is taking her maintenance inhaler for COPD as prescribed. Patient did not have a lot of time to talk today because she was headed to an appt for her sister.  Care Team: Primary Care Provider: [name], MD ; Needs to be scheduled- patient reports she will schedule when she returns from [place]  around October 19-20th Cardiologist: [name], PA; Next Scheduled Visit: 05/21/24  Medication Access/Adherence  Current Pharmacy:  [id number] [address], [place] - [address] AT [facility] [address] [place] [address] Phone: [phone] Fax: [phone]  [name] [address] [place] [address] Phone:  [phone] Fax: [phone]   Patient reports affordability concerns with their medications: No  Patient reports access/transportation concerns to their pharmacy: No  - she picks up her own medications from [facility] Patient reports adherence concerns with their medications:  No  - has correct medication bottles at home  Heart Failure (EF 20-25%):  Current medications:  ACEi/ARB/ARNI:  none SGLT2i: Farxiga  currently being held s/p UTI (patient confirms that she has bottle but has not been taking) Beta blocker: bisoprolol  2.5 mg daily  Mineralocorticoid Receptor Antagonist: spironolactone  12.5 mg daily (confirms she has been cutting in half) Diuretic regimen: torsemide  20 mg PRN (last dose ~1 week ago)  Current home weights: Reports weights are trending down, 168 lbs at home this AM  Patient denies volume overload signs or symptoms including LE swelling - she is only needing torsemide  ~once per week  Current physical activity: reports she has been able to get up and walk around at home  Current medication access support: [name]  Diabetes:  Current medications: insulin  degludec (Tresiba ) 5 units daily, Ozempic  2 mg once weekly (she is still finishing her Ozempic  1 mg pen)  Medications tried in the past: metformin  (renal function)  Current glucose readings: reports she needs to reapply a Dexcom CGM, but she was wearing one last week.  Patient denies hypoglycemic s/sx including dizziness, shakiness, sweating. Patient denies hyperglycemic symptoms including polyuria, polydipsia, polyphagia, nocturia, neuropathy, blurred vision.  Current meal patterns: Usually eating baked foods, chicken sandwich, salad.  - Drinks: Drinking water throughout the day, 3-4 bottles of water per day. Reports that she has juice 1-2x per day. Reports that she dilutes her juice with water.   COPD:  Current medications: Anoro ellipta  (umeclidinium-vilaternol) 62.5-25 mcg/act 1 puff daily  Reviewed inhaler technique today  Atrial Flutter:  Current medications:  Rate Control: bisoprolol  2.5 mg daily Anticoagulation Regimen: Apixaban  5 mg BID  Medication Management:  Current adherence strategy: patient is managing her own medications, appears to understand regimen and importance of adherence  Patient reports Good adherence to medications  Recent fill hx influenced by recent  admission   Objective:  BP Readings from Last 3 Encounters:  06/24/24 (!) 145/78  06/19/24 (!) 144/81  06/05/24 102/66    Lab Results  Component Value Date   HGBA1C 8.6 (H) 06/19/2024   HGBA1C 7.5 (A) 03/25/2024   HGBA1C 7.8 (A) 12/26/2023       Latest Ref Rng & Units 06/24/2024    6:50 PM 06/19/2024   11:56 AM 06/05/2024    3:52 PM  BMP  Glucose 70 - 99 mg/dL 859  878  597   BUN 8 - 23 mg/dL 15  31  30    Creatinine 0.44 - 1.00 mg/dL 8.62  8.65  8.11   BUN/Creat Ratio 12 - 28  23  16    Sodium 135 - 145 mmol/L 145  144  136   Potassium 3.5 - 5.1 mmol/L 3.7  4.3  4.1   Chloride 98 - 111 mmol/L 109  107  96   CO2 22 - 32 mmol/L 20  19  19    Calcium  8.9 - 10.3 mg/dL 9.1  9.7  9.1     Lab Results  Component Value Date   CHOL 194 12/28/2023   HDL 59 12/28/2023   LDLCALC 122 (H) 12/28/2023   LDLDIRECT 122.3 03/06/2007   TRIG 66 12/28/2023   CHOLHDL 3.3 12/28/2023    Medications Reviewed Today     Reviewed by Rashea Hoskie  P, [name] (Pharmacist) on 07/04/24 at 1720  Med List Status: <None>   Medication Order Taking? Sig Documenting Provider Last Dose Status Informant  acetaminophen  (TYLENOL ) 500 MG tablet [id number]  Take 2 tablets (1,000 mg total) by mouth every 8 (eight) hours as needed for mild pain (pain score 1-3). Nguyen, Diana, MD  Active   albuterol  (VENTOLIN  HFA) 108 [phone] Base) MCG/ACT inhaler [id number]  Inhale 2 puffs into the lungs every 6 (six) hours as needed for wheezing or shortness of breath. [name], MD  Active Self, Pharmacy Records  apixaban  (ELIQUIS ) 5 MG TABS tablet [id number]  Take 1 tablet (5 mg total) by mouth 2 (two) times daily. Bensimhon, Toribio SAUNDERS, MD  Active Self, Pharmacy Records           Med Note (STEFFENS, MICHELLE P   Fri May 03, 2024  9:15 AM)    atorvastatin  (LIPITOR ) 80 MG tablet [id number]  Take 1 tablet (80 mg total) by mouth at bedtime. Peggy Sharper, DO  Active Self, Pharmacy Records  bisoprolol  (ZEBETA ) 5 MG tablet [id number]  Take 0.5  tablets (2.5 mg total) by mouth daily. Charmayne Holmes, DO  Active   calcium -vitamin D  (OSCAL WITH D) 500-200 MG-UNIT tablet [id number]  Take 1 tablet by mouth 2 (two) times daily.  Patient not taking: Reported on 05/30/2024   Lovey Satterfield, MD  Active Self, Pharmacy Records  Continuous Glucose Sensor [facility] G7 SENSOR) [place] [id number] Yes Inject 1 Application into the skin. Every 10 days [provider]  Active Self, Pharmacy Records   Patient not taking:   Discontinued 07/04/24 1719 (Patient has not taken in last 30 days) insulin  degludec (TRESIBA ) 100 UNIT/ML FlexTouch Pen [id number] Yes Inject 5 Units into the skin daily. D'Mello, Rosalyn, DO  Active Self, Pharmacy Records  ketoconazole  (NIZORAL ) 2 % shampoo [id number]  Apply 1 Application topically once a week. [provider]  Active Self, Pharmacy Records           Med Note LEOBARDO, NICOLE   Sat Apr 13, 2024 10:40 PM) Patient uses on Tuesdays  levocetirizine (XYZAL ) 5 MG tablet [id number]  Take 1 tablet (5 mg total) by mouth at bedtime. Peggy Sharper, DO  Active Self, Pharmacy Records           Med Note [facility], [place]   Tue May 07, 2024  3:07 PM)    OXYGEN  [id number]  Inhale 2 L into the lungs at bedtime. [provider]  Active Self, Pharmacy Records  Semaglutide , 2 MG/DOSE, (OZEMPIC , 2 MG/DOSE,) 8 MG/3ML SOPN [id number]  Inject 2 mg into the skin once a week.  Patient not taking: Reported on 07/04/2024   Charmayne Holmes, DO  Active   senna-docusate (SENOKOT-S) 8.6-50 MG tablet [id number]  Take 1 tablet by mouth at bedtime as needed for mild constipation. Nguyen, Diana, MD  Active   spironolactone  (ALDACTONE ) 25 MG tablet [id number] Yes Take 0.5 tablets (12.5 mg total) by mouth daily. Charmayne Holmes, DO  Active   torsemide  (DEMADEX ) 20 MG tablet [id number] Yes Take 1 tablet by mouth as needed, take a dose tomorrow 05/22/24 and Fri 05/24/24 if still needed.  Patient taking differently: Take 1 tablet by mouth as  needed, take a dose tomorrow 05/22/24 and Fri 05/24/24 if still needed.   Lesia Sharper Barter, PA-C  Active            Med Note DELSA LORAIN SQUIBB   Thu Jul 04, 2024  5:19 PM)    triamcinolone   ointment (KENALOG ) 0.1 % [id number]  Apply 1 Application topically 2 (two) times daily. Mix with Eucerin cream Charmayne Holmes, DO  Active   umeclidinium-vilanterol (ANORO ELLIPTA ) 62.5-25 MCG/ACT AEPB [id number] Yes Inhale 1 puff into the lungs daily. Charmayne Holmes, DO  Active               Assessment/Plan:   Heart Failure (EF 20-25%): - Currently appropriately managed, with room for optimization. Fluid status is much improved, and labs normalized after patient reduced frequency of torsemide  dosing. Given severely reduced EF, would prioritize adding back SGLT2i to prevent HF hospitalization and mortality at next PCP appointment as long as patient does not have s/sx of an active UTI - Reviewed appropriate blood pressure monitoring technique and reviewed goal blood pressure - Reviewed to weigh daily and when to contact cardiology with weight gain - Reviewed dietary modifications including limiting fluid intake to no more than 4 bottles of water per day.  - Recommend to continue current regimen: Current medications:  ACEi/ARB/ARNI: none SGLT2i: Farxiga  currently being held s/p tx for UTI- consider restarting at follow-up Beta blocker: bisoprolol  2.5 mg daily  Mineralocorticoid Receptor Antagonist: spironolactone  12.5 mg daily  Diuretic regimen: torsemide  20 mg PRN fluid/weight gain   Diabetes: - Currently uncontrolled with most recent A1C of 8.6% above goal <7%, and worsened from 7.5%. Agree with increasing Ozempic  to 2 mg weekly at next fill, though may not provide a lot more glycemic control compared to the 1 mg dose. Would prioritize restarting SGLT2i for HF and DM. Also encouraged patient to reapply Dexcom sensor to determine whether dose increase in insulin  is needed. - Reviewed long term  cardiovascular and renal outcomes of uncontrolled blood sugar - Reviewed goal A1c, goal fasting, and goal 2 hour post prandial glucose - Reviewed hypoglycemia management plan and the rule of 15 - Reviewed dietary modifications including  utilizing the healthy plate method, limiting portion size of carbohydrate foods, increasing intake of protein and non-starchy vegetables. Previously discussed discontinuing intake of juice and replacing with a sugar free option like flavored water.  - Recommend to continue Ozempic  1 mg weekly and increase to 2 mg weekly at next fill - Recommend to continue Tresiba  5 units daily - If she is able to restart Farxiga  at follow-up, this may also help achieve A1C goal  - Recommend to check glucose continuously with Dexcom G7 CGM and reader. - Next A1C due 09/18/24   Patient verbalized understanding of treatment plan.    Follow Up Plan:  Pharmacist telephone 07/04/24 PCP clinic visit in 06/05/24   Lorain Baseman, PharmD [facility] [phone]

## 2024-07-07 MED ORDER — APIXABAN 5 MG PO TABS: 5.0000 mg | ORAL_TABLET | Freq: Two times a day (BID) | ORAL | 3 refills | Status: AC

## 2024-07-25 ENCOUNTER — Telehealth: Payer: Self-pay

## 2024-07-25 ENCOUNTER — Other Ambulatory Visit

## 2024-07-25 NOTE — Progress Notes (Deleted)
 07/25/2024 Name: [name] MRN: [id number] DOB: [birth year]  No chief complaint on file.   [name] is a 73 y.o. year old female who presented for a telephone visit.   They were referred to the pharmacist by their PCP for assistance in managing complex medication management. PMH includes HTN, HFrEF (EF 20-25%) s/p ICD, atrial flutter, COPD, T2DM with gastroparesis, MASL, CKD4, HLD, BMI > 30, tobacco use.   Subjective: Patient was last seen by [name], MD, on 05/06/24. At last visit, BP was 109/58 mmHg, HR 89 bpm. Patient appeared to be volume overloaded in the setting of holding potassium, spironolactone , and torsemide  for AKI and hyperkalemia. She was restarted on torsemide  20 mg daily. Her labs returned with Scr further increased from 2.39 > 3.30, K 2.9 and she was directly admitted for IV diuresis. She was admitted from 05/07/24 to 05/13/24. She was found to have a UTI and Farxiga  was held during admission and at discharge. Her bisoprolol  and spironolactone  were dose-reduced. She was instructed to take torsemide  PRN.  She was discharged to SNF [facility]). She was initially scheduled for a telephone appt with pharmacy on 05/16/24, but this was reschedule since she was still residing at [place]. Patient was seen by cardiology, [name], PA on 05/21/24. Patient reported that she was taking torsemide  PRN for intermittent swelling, and was instructed to continue torsemide  20 mg PRN as she was hypervolemic on exam. No other changes were made to her medication regimen. She was engaged by pharmacy via telephone on 05/30/24. She reviewed all her medication bottles with me over the phone. Confirmed that she was receiving Ozempic  at the SNF and instructed her to resume. Communicated with PCP to consider restarting SGLT2i at f/u, if s/sx of UTI were resolved. She was seen by Dr. Charmayne on 06/05/24. Weight was stable, appeared euvolemic. Given soft BP (102/66 mmHg), did not restart SGLT2i. A1C had  increased to 8.6%, and Scr was elevated to 1.88 mg/dL in the setting of taking torsemide  daily. She was seen again by Dr. Charmayne on 06/19/24. Scr had improved to 1.34 mg/dL with PRN use of torsemide . BP was 144/81 mmHg. She was wearing CGM and TIR was 40%, TAR was 60%. She was instructed to increase Ozempic  to 2 mg weekly. She was seen in the ED on 06/24/24 for ShOB, thought to  be a COPD exacerbation. She was treated with prednisone  and doxycycline .  Today, patient reports doing well. Her breathing has been better since she completed course of prednisone  and doxycycline . However, the prednisone  caused her BG to be elevated to the 300s last week. She is still finishing her Ozempic  1 mg pen, then plans to increase to Ozempic  2 mg weekly. She has only needed torsemide  once in the past week. She reports she is taking her maintenance inhaler for COPD as prescribed. Patient did not have a lot of time to talk today because she was headed to an appt for her sister.  Need to schedule with PCP  Care Team: Primary Care Provider: [name], MD ; Needs to be scheduled- patient reports she will schedule when she returns from [place]  around October 19-20th Cardiologist: [name], PA; Next Scheduled Visit: 05/21/24  Medication Access/Adherence  Current Pharmacy:  [facility] #[id number] [address], [place] - [address] AT [facility] [address] [place] [address] Phone: [phone] Fax: [phone]  [name] [address] [place] [address] Phone: [phone]  Fax: [phone]   Patient reports affordability concerns with their medications: No  Patient reports access/transportation concerns to their pharmacy: No  - she [facility] up her own [facility] from [facility] Patient reports adherence concerns with their medications:  No  - has correct medication bottles at home  Heart Failure (EF 20-25%):  Current medications:  ACEi/ARB/ARNI:  none SGLT2i: Farxiga  currently being held s/p UTI (patient confirms that she has bottle but has not been taking) Beta blocker: bisoprolol  2.5 mg daily  Mineralocorticoid Receptor Antagonist: spironolactone  12.5 mg daily (confirms she has been cutting in half) Diuretic regimen: torsemide  20 mg PRN (last dose ~1 week ago)  Current home weights: Reports weights are trending down, 168 lbs at home this AM  Patient denies volume overload signs or symptoms including LE swelling - she is only needing torsemide  ~once per week  Current physical activity: reports she has been able to get up and walk around at home  Current medication access support: [name]  Diabetes:  Current medications: insulin  degludec (Tresiba ) 5 units daily, Ozempic  2 mg once weekly (she is still finishing her Ozempic  1 mg pen)  Medications tried in the past: metformin  (renal function)  Current glucose readings: reports she needs to reapply a Dexcom CGM, but she was wearing one last week.  Patient denies hypoglycemic s/sx including dizziness, shakiness, sweating. Patient denies hyperglycemic symptoms including polyuria, polydipsia, polyphagia, nocturia, neuropathy, blurred vision.  Current meal patterns: Usually eating baked foods, chicken sandwich, salad.  - Drinks: Drinking water throughout the day, 3-4 bottles of water per day. Reports that she has juice 1-2x per day. Reports that she dilutes her juice with water.   COPD:  Current medications: Anoro ellipta  (umeclidinium-vilaternol) 62.5-25 mcg/act 1 puff daily  Reviewed inhaler technique today  Atrial Flutter:  Current medications:  Rate Control: bisoprolol  2.5 mg daily Anticoagulation Regimen: Apixaban  5 mg BID  Medication Management:  Current adherence strategy: patient is managing her own medications, appears to understand regimen and importance of adherence  Patient reports Good adherence to medications  Recent fill hx influenced by recent  admission   Objective:  BP Readings from Last 3 Encounters:  06/24/24 (!) 145/78  06/19/24 (!) 144/81  06/05/24 102/66    Lab Results  Component Value Date   HGBA1C 8.6 (H) 06/19/2024   HGBA1C 7.5 (A) 03/25/2024   HGBA1C 7.8 (A) 12/26/2023       Latest Ref Rng & Units 06/24/2024    6:50 PM 06/19/2024   11:56 AM 06/05/2024    3:52 PM  BMP  Glucose 70 - 99 mg/dL 859  878  597   BUN 8 - 23 mg/dL 15  31  30    Creatinine 0.44 - 1.00 mg/dL 8.62  8.65  8.11   BUN/Creat Ratio 12 - 28  23  16    Sodium 135 - 145 mmol/L 145  144  136   Potassium 3.5 - 5.1 mmol/L 3.7  4.3  4.1   Chloride 98 - 111 mmol/L 109  107  96   CO2 22 - 32 mmol/L 20  19  19    Calcium  8.9 - 10.3 mg/dL 9.1  9.7  9.1     Lab Results  Component Value Date   CHOL 194 12/28/2023   HDL 59 12/28/2023   LDLCALC 122 (H) 12/28/2023   LDLDIRECT 122.3 03/06/2007   TRIG 66 12/28/2023   CHOLHDL 3.3 12/28/2023    Medications Reviewed Today   Medications were not reviewed in this encounter  Assessment/Plan:   Heart Failure (EF 20-25%): - Currently appropriately managed, with room for optimization. Fluid status is much improved, and labs normalized after patient reduced frequency of torsemide  dosing. Given severely reduced EF, would prioritize adding back SGLT2i to prevent HF hospitalization and mortality at next PCP appointment as long as patient does not have s/sx of an active UTI - Reviewed appropriate blood pressure monitoring technique and reviewed goal blood pressure - Reviewed to weigh daily and when to contact cardiology with weight gain - Reviewed dietary modifications including limiting fluid intake to no more than 4 bottles of water per day.  - Recommend to continue current regimen: Current medications:  ACEi/ARB/ARNI: none SGLT2i: Farxiga  currently being held s/p tx for UTI- consider restarting at follow-up Beta blocker: bisoprolol  2.5 mg daily  Mineralocorticoid Receptor Antagonist:  spironolactone  12.5 mg daily  Diuretic regimen: torsemide  20 mg PRN fluid/weight gain   Diabetes: - Currently uncontrolled with most recent A1C of 8.6% above goal <7%, and worsened from 7.5%. Agree with increasing Ozempic  to 2 mg weekly at next fill, though may not provide a lot more glycemic control compared to the 1 mg dose. Would prioritize restarting SGLT2i for HF and DM. Also encouraged patient to reapply Dexcom sensor to determine whether dose increase in insulin  is needed. - Reviewed long term cardiovascular and renal outcomes of uncontrolled blood sugar - Reviewed goal A1c, goal fasting, and goal 2 hour post prandial glucose - Reviewed hypoglycemia management plan and the rule of 15 - Reviewed dietary modifications including  utilizing the healthy plate method, limiting portion size of carbohydrate foods, increasing intake of protein and non-starchy vegetables. Previously discussed discontinuing intake of juice and replacing with a sugar free option like flavored water.  - Recommend to continue Ozempic  1 mg weekly and increase to 2 mg weekly at next fill - Recommend to continue Tresiba  5 units daily - If she is able to restart Farxiga  at follow-up, this may also help achieve A1C goal  - Recommend to check glucose continuously with Dexcom G7 CGM and reader. - Next A1C due 09/18/24   Patient verbalized understanding of treatment plan.    Follow Up Plan:  Pharmacist telephone 07/04/24 PCP clinic visit in 06/05/24   Lorain Baseman, PharmD [facility] [phone]

## 2024-07-25 NOTE — Telephone Encounter (Signed)
 Attempted to contact patient for scheduled appointment for medication management. Left HIPAA compliant message for patient to return my call at their convenience.   Lorain Baseman, PharmD [facility] [phone]

## 2024-07-29 DIAGNOSIS — I509 Heart failure, unspecified: Secondary | ICD-10-CM | POA: Diagnosis not present

## 2024-07-29 DIAGNOSIS — J449 Chronic obstructive pulmonary disease, unspecified: Secondary | ICD-10-CM | POA: Diagnosis not present

## 2024-07-30 ENCOUNTER — Ambulatory Visit
Admission: RE | Admit: 2024-07-30 | Discharge: 2024-07-30 | Disposition: A | Discharge status: Home / Self Care | Source: Ambulatory Visit | Attending: Internal Medicine | Admitting: Internal Medicine

## 2024-07-30 DIAGNOSIS — Z1231 Encounter for screening mammogram for malignant neoplasm of breast: Secondary | ICD-10-CM | POA: Diagnosis not present

## 2024-08-05 ENCOUNTER — Ambulatory Visit: Payer: Self-pay | Admitting: Student

## 2024-08-05 ENCOUNTER — Telehealth: Payer: Self-pay | Admitting: *Deleted

## 2024-08-05 NOTE — Telephone Encounter (Signed)
 Copied from CRM [id number]. Topic: General - Other >> Aug 05, 2024 12:45 PM [name] wrote: Reason for CRM: Patient is calling in due to a missed call, nothing noted at this time. Please contact the patient back if needed.

## 2024-08-05 NOTE — Telephone Encounter (Signed)
Unsure who called the pt. 

## 2024-08-12 ENCOUNTER — Telehealth: Payer: Self-pay

## 2024-08-12 NOTE — Telephone Encounter (Signed)
 This patient is appearing on a report for being at risk of failing the adherence measure for cholesterol (statin) medications this calendar year.   Medication: atorvastatin  80 mg daily Last fill date: 06/07/24 for 30 day supply  Contacted pharmacy to facilitate refills. Pharmacy f/u appt scheduled for 08/15/24. Will discuss adherence at that time.   Lorain Baseman, PharmD [facility] [phone]

## 2024-08-15 ENCOUNTER — Other Ambulatory Visit (INDEPENDENT_AMBULATORY_CARE_PROVIDER_SITE_OTHER)

## 2024-08-15 DIAGNOSIS — E1129 Type 2 diabetes mellitus with other diabetic kidney complication: Secondary | ICD-10-CM

## 2024-08-15 DIAGNOSIS — J449 Chronic obstructive pulmonary disease, unspecified: Secondary | ICD-10-CM

## 2024-08-15 DIAGNOSIS — I5042 Chronic combined systolic (congestive) and diastolic (congestive) heart failure: Secondary | ICD-10-CM

## 2024-08-15 NOTE — Progress Notes (Signed)
 08/15/2024 Name: [name] MRN: [id number] DOB: [birth year]  Chief Complaint  Patient presents with   Diabetes   Hyperlipidemia   Congestive Heart Failure   COPD    [name] is a 73 y.o. year old female who presented for a telephone visit.   They were referred to the pharmacist by their PCP for assistance in managing complex medication management. PMH includes HTN, HFrEF (EF 20-25%) s/p ICD, atrial flutter, COPD, T2DM with gastroparesis, MASL, CKD4, HLD, BMI > 30, tobacco use.   Subjective: Patient was last seen by [name], MD, on 05/06/24. At last visit, BP was 109/58 mmHg, HR 89 bpm. Patient appeared to be volume overloaded in the setting of holding potassium, spironolactone , and torsemide  for AKI and hyperkalemia. She was restarted on torsemide  20 mg daily. Her labs returned with Scr further increased from 2.39 > 3.30, K 2.9 and she was directly admitted for IV diuresis. She was admitted from 05/07/24 to 05/13/24. She was found to have a UTI and Farxiga  was held during admission and at discharge. Her bisoprolol  and spironolactone  were dose-reduced. She was instructed to take torsemide  PRN.  She was discharged to SNF [facility] She was initially scheduled for a telephone appt with pharmacy on 05/16/24, but this was reschedule since she was still residing at [place]. Patient was seen by cardiology, [name], PA on 05/21/24. Patient reported that she was taking torsemide  PRN for intermittent swelling, and was instructed to continue torsemide  20 mg PRN as she was hypervolemic on exam. No other changes were made to her medication regimen. She was engaged by pharmacy via telephone on 05/30/24. She reviewed all her medication bottles with me over the phone. Confirmed that she was receiving Ozempic  at the SNF and instructed her to resume. Communicated with PCP to consider restarting SGLT2i at f/u, if s/sx of UTI were resolved. She was seen by Dr. Charmayne on 06/05/24. Weight was stable,  appeared euvolemic. Given soft BP (102/66 mmHg), did not restart SGLT2i. A1C had increased to 8.6%, and Scr was elevated to 1.88 mg/dL in the setting of taking torsemide  daily. She was seen again by Dr. Charmayne on 06/19/24. Scr had improved to 1.34 mg/dL with PRN use of torsemide . BP was 144/81 mmHg. She was wearing CGM and TIR was 40%, TAR was 60%. She was instructed to increase Ozempic  to 2 mg weekly. At pharmacy call on 07/04/24, patient reported breathing was improved but BG were elevated to 300s in the setting of recent prednisone . She had not yet increased to Ozempic  2 mg weekly, but was instructed to do so when she finished her 1 mg pen.  Today, patient reports doing well. She states she is in bed during our call. She did increase Ozempic  to 2 mg weekly and is tolerating well. She had a Dexcom on that ended yesterday. Unable to review specific numbers, but she said she was mostly in the green zone.    Care Team: Primary Care Provider: [name], MD ; Needs to be scheduled- declined scheduling today since she was in bed  Cardiologist: [name], PA; Next Scheduled Visit: 08/22/24  Medication Access/Adherence  Current Pharmacy:  [id number] [place], [place] - [address] AT [facility] [address] [place] [address] Phone: [phone] Fax: [phone]  [name] [address] [place] [address] Phone: [phone] Fax: [phone]   Patient reports affordability concerns with their medications: No  Patient reports  access/transportation concerns to their pharmacy: No  - she picks up her own medications from [facility] Patient reports adherence concerns with their medications:  Yes  - frequently forgets atorvastatin  (has excess supply). States she has excess supply of Anoro Ellipta  but says she is taking daily.  Heart Failure (EF 20-25%):  Current medications:  ACEi/ARB/ARNI: none SGLT2i: Farxiga  held s/p UTI in  August Beta blocker: bisoprolol  2.5 mg daily  Mineralocorticoid Receptor Antagonist: spironolactone  12.5 mg daily Diuretic regimen: torsemide  20 mg PRN (reports last dose was sometime last week)  Current home weights: Reports weights are stable  Patient denies volume overload signs or symptoms including LE swelling. She is not needing torsemide  frequently.  Current physical activity: reports she has been able to get up and walk around at home  Current medication access support: [name]  Diabetes:  Current medications: insulin  degludec (Tresiba ) 5 units daily, Ozempic  2 mg once weekly Medications tried in the past: metformin  (renal function)  [facility] since increasing Ozempic  to 2 mg weekly  Current glucose readings: Reports her Dexcom came off yesterday. Reports sugars were mostly in the green zone. She uses receiver device to monitor sugars  Patient denies hypoglycemic s/sx including dizziness, shakiness, sweating. Patient denies hyperglycemic symptoms including polyuria, polydipsia, polyphagia, nocturia, neuropathy, blurred vision.  Current meal patterns: Usually eating baked foods, chicken sandwich, salad.  - Drinks: Drinking water throughout the day, 3-4 bottles of water per day. Reports that she has juice 1-2x per day. Reports that she dilutes her juice with water.   Hyperlipidemia/ASCVD Risk Reduction  Current lipid lowering medications: atorvastatin  80 mg daily (reports she frequently forgets doses because it is written for her to take at bedtime)  COPD:  Current medications: Anoro ellipta  (umeclidinium-vilaternol) 62.5-25 mcg/act 1 puff daily  Atrial Flutter:  Current medications:  Rate Control: bisoprolol  2.5 mg daily Anticoagulation Regimen: Apixaban  5 mg BID   Objective:  BP Readings from Last 3 Encounters:  06/24/24 (!) 145/78  06/19/24 (!) 144/81  06/05/24 102/66    Lab Results  Component Value Date   HGBA1C 8.6 (H) 06/19/2024   HGBA1C  7.5 (A) 03/25/2024   HGBA1C 7.8 (A) 12/26/2023       Latest Ref Rng & Units 06/24/2024    6:50 PM 06/19/2024   11:56 AM 06/05/2024    3:52 PM  BMP  Glucose 70 - 99 mg/dL 859  878  597   BUN 8 - 23 mg/dL 15  31  30    Creatinine 0.44 - 1.00 mg/dL 8.62  8.65  8.11   BUN/Creat Ratio 12 - 28  23  16    Sodium 135 - 145 mmol/L 145  144  136   Potassium 3.5 - 5.1 mmol/L 3.7  4.3  4.1   Chloride 98 - 111 mmol/L 109  107  96   CO2 22 - 32 mmol/L 20  19  19    Calcium  8.9 - 10.3 mg/dL 9.1  9.7  9.1     Lab Results  Component Value Date   CHOL 194 12/28/2023   HDL 59 12/28/2023   LDLCALC 122 (H) 12/28/2023   LDLDIRECT 122.3 03/06/2007   TRIG 66 12/28/2023   CHOLHDL 3.3 12/28/2023    Medications Reviewed Today     Reviewed by [name], [name] (Pharmacist) on 08/15/24 at 1023  Med List Status: <None>   Medication Order Taking? Sig Documenting Provider Last Dose Status Informant  acetaminophen  (TYLENOL ) 500 MG tablet [id number]  Take 2  tablets (1,000 mg total) by mouth every 8 (eight) hours as needed for mild pain (pain score 1-3). Nguyen, Diana, MD  Active   albuterol  (VENTOLIN  HFA) 108 [phone] Base) MCG/ACT inhaler [id number]  Inhale 2 puffs into the lungs every 6 (six) hours as needed for wheezing or shortness of breath. Amoako, Prince, MD  Active Self, Pharmacy Records  apixaban  (ELIQUIS ) 5 MG TABS tablet [id number] Yes Take 1 tablet (5 mg total) by mouth 2 (two) times daily. Charmayne Holmes, DO  Active   atorvastatin  (LIPITOR ) 80 MG tablet [id number] Yes Take 1 tablet (80 mg total) by mouth at bedtime. Elicia Sharper, DO  Active Self, Pharmacy Records  bisoprolol  (ZEBETA ) 5 MG tablet [id number] Yes Take 0.5 tablets (2.5 mg total) by mouth daily. Charmayne Holmes, DO  Active   calcium -vitamin D  (OSCAL WITH D) 500-200 MG-UNIT tablet [id number]  Take 1 tablet by mouth 2 (two) times daily.  Patient not taking: Reported on 05/30/2024   Lovey Satterfield, MD  Active Self, Pharmacy Records  Continuous  Glucose Sensor [facility] G7 SENSOR) [place] [id number] Yes Change sensor every 10 days Charmayne Holmes, DO  Active   insulin  degludec (TRESIBA ) 100 UNIT/ML FlexTouch Pen [id number] Yes Inject 5 Units into the skin daily. D'Mello, Rosalyn, DO  Active Self, Pharmacy Records  ketoconazole  (NIZORAL ) 2 % shampoo [id number]  Apply 1 Application topically once a week. [provider]  Active Self, Pharmacy Records           Med Note LEOBARDO, NICOLE   Sat Apr 13, 2024 10:40 PM) Patient uses on Tuesdays  levocetirizine (XYZAL ) 5 MG tablet [id number]  Take 1 tablet (5 mg total) by mouth at bedtime. Elicia Sharper, DO  Active Self, Pharmacy Records           Med Note [facility] [place], [place] A   Tue May 07, 2024  3:07 PM)    OXYGEN  [id number]  Inhale 2 L into the lungs at bedtime. [provider]  Active Self, Pharmacy Records  Semaglutide , 2 MG/DOSE, (OZEMPIC , 2 MG/DOSE,) 8 MG/3ML SOPN [id number] Yes Inject 2 mg into the skin once a week. Charmayne Holmes, DO  Active   senna-docusate (SENOKOT-S) 8.6-50 MG tablet [id number]  Take 1 tablet by mouth at bedtime as needed for mild constipation. Nguyen, Diana, MD  Active   spironolactone  (ALDACTONE ) 25 MG tablet [id number] Yes Take 0.5 tablets (12.5 mg total) by mouth daily. Charmayne Holmes, DO  Active   torsemide  (DEMADEX ) 20 MG tablet [id number] Yes Take 1 tablet by mouth as needed, take a dose tomorrow 05/22/24 and Fri 05/24/24 if still needed. Lesia Sharper Barter, PA-C  Active            Med Note DELSA LORAIN SQUIBB   Thu Jul 04, 2024  5:19 PM)    triamcinolone  ointment (KENALOG ) 0.1 % [id number]  Apply 1 Application topically 2 (two) times daily. Mix with Eucerin cream Charmayne Holmes, DO  Active   umeclidinium-vilanterol (ANORO ELLIPTA ) 62.5-25 MCG/ACT AEPB [id number] Yes Inhale 1 puff into the lungs daily. Charmayne Holmes, DO  Active               Assessment/Plan:   Heart Failure (EF 20-25%): - Currently appropriately managed, with room for optimization.  Given severely reduced EF, would prioritize adding back SGLT2i to prevent HF hospitalization and mortality at next PCP appointment as long as patient does not have s/sx of an active UTI and glycemic control is improved. - Reviewed appropriate blood pressure monitoring technique and reviewed  goal blood pressure - Reviewed to weigh daily and when to contact cardiology with weight gain - Reviewed dietary modifications including limiting fluid intake to no more than 4 bottles of water per day.  - Recommend to continue current regimen: Current medications:  ACEi/ARB/ARNI: none SGLT2i: Farxiga  currently being held s/p UTI in Aug 2025- consider restarting at follow-up Beta blocker: bisoprolol  2.5 mg daily  Mineralocorticoid Receptor Antagonist: spironolactone  12.5 mg daily  Diuretic regimen: torsemide  20 mg PRN fluid/weight gain   Diabetes: - Currently uncontrolled with most recent A1C of 8.6% above goal <7%, and worsened from 7.5%. She is tolerating max-dose Ozempic  well. Encouraged continued use of Dexcom. - Reviewed long term cardiovascular and renal outcomes of uncontrolled blood sugar - Reviewed goal A1c, goal fasting, and goal 2 hour post prandial glucose - Reviewed hypoglycemia management plan and the rule of 15 - Reviewed dietary modifications including  utilizing the healthy plate method, limiting portion size of carbohydrate foods, increasing intake of protein and non-starchy vegetables.  - Recommend to continue Ozempic  2 mg weekly  - Recommend to continue Tresiba  5 units daily - If she is able to restart Farxiga  at follow-up, this may also help achieve A1C goal  - Recommend to check glucose continuously with Dexcom G7 CGM and reader. - Next A1C due 09/18/24   Hyperlipidemia/ASCVD Risk Reduction: - Currently uncontrolled with most recent LDL-C of 122 mg/dL above goal < 70 mg/dL given U7IF + comorbidities. High intensity statin indicated. Advised patient she could take atorvastatin  in  the morning or evening with her other medications if it is difficult to remember at bedtime - Reviewed long term complications of uncontrolled cholesterol - Recommend to continue atorvastatin  80 mg daily    Patient confirmed adherence to Anoro Ellipta  (COPD) and apixaban  5 mg BID (Afib)   Patient verbalized understanding of treatment plan.    Follow Up Plan:  Pharmacist telephone 09/19/24 PCP clinic visit needs to be scheduled - patient stated she would call back [facility] to schedule when she has her calendar   Lorain Baseman, PharmD [facility] [phone]

## 2024-08-17 ENCOUNTER — Other Ambulatory Visit: Payer: Self-pay

## 2024-08-22 ENCOUNTER — Encounter: Payer: Self-pay | Admitting: Student

## 2024-08-22 ENCOUNTER — Ambulatory Visit: Attending: Student | Admitting: Student

## 2024-08-22 ENCOUNTER — Other Ambulatory Visit: Payer: Self-pay | Admitting: Student

## 2024-08-22 VITALS — BP 142/84 | HR 98 | Ht 62.0 in | Wt 172.8 lb

## 2024-08-22 DIAGNOSIS — I5022 Chronic systolic (congestive) heart failure: Secondary | ICD-10-CM | POA: Diagnosis not present

## 2024-08-22 DIAGNOSIS — I10 Essential (primary) hypertension: Secondary | ICD-10-CM

## 2024-08-22 DIAGNOSIS — I4892 Unspecified atrial flutter: Secondary | ICD-10-CM

## 2024-08-22 LAB — CUP PACEART INCLINIC DEVICE CHECK
Battery Remaining Longevity: 3 mo
Brady Statistic RV Percent Paced: 0 %
Date Time Interrogation Session: 20251120120123
HighPow Impedance: 41.8275
Implantable Lead Connection Status: 753985
Implantable Lead Implant Date: 20080801
Implantable Lead Location: 753860
Implantable Lead Model: 7120
Implantable Pulse Generator Implant Date: 20150701
Lead Channel Impedance Value: 387.5 Ohm
Lead Channel Pacing Threshold Amplitude: 0.75 V
Lead Channel Pacing Threshold Amplitude: 0.75 V
Lead Channel Pacing Threshold Pulse Width: 0.5 ms
Lead Channel Pacing Threshold Pulse Width: 0.5 ms
Lead Channel Sensing Intrinsic Amplitude: 11.9 mV
Lead Channel Setting Pacing Amplitude: 2.5 V
Lead Channel Setting Pacing Pulse Width: 0.5 ms
Lead Channel Setting Sensing Sensitivity: 0.5 mV
Pulse Gen Serial Number: 7206538

## 2024-08-22 NOTE — Patient Instructions (Addendum)
 Medication Instructions:  No medication changes today. *If you need a refill on your cardiac medications before your next appointment, please call your pharmacy*  Lab Work: No labwork ordered today. If you have labs (blood work) drawn today and your tests are completely normal, you will receive your results only by: MyChart Message (if you have MyChart) OR A paper copy in the mail If you have any lab test that is abnormal or we need to change your treatment, we will call you to review the results.  Testing/Procedures: No testing ordered today  Follow-Up: At [facility], you and your health needs are our priority.  As part of our continuing mission to provide you with exceptional heart care, our providers are all part of one team.  This team includes your primary Cardiologist (physician) and Advanced Practice Providers or APPs (Physician Assistants and Nurse Practitioners) who all work together to provide you with the care you need, when you need it.  Your next appointment:   4 month(s)  Provider:   Ozell Jodie Passey, PA-C   We recommend signing up for the patient portal called MyChart.  Sign up information is provided on this After Visit Summary.  MyChart is used to connect with patients for Virtual Visits (Telemedicine).  Patients are able to view lab/test results, encounter notes, upcoming appointments, etc.  Non-urgent messages can be sent to your provider as well.   To learn more about what you can do with MyChart, go to [email or web address].

## 2024-08-22 NOTE — Progress Notes (Signed)
 Normal in-clinic single chamber ICD check. Presenting Rhythm: VS. Routine testing was performed. Thresholds, sensing, and impedance demonstrate stable parameters and no programming changes needed. No treated arrhythmia. Estimated longevity 3.6 months. Pt has declined remote follow up. States they could never get it to work.  EF remains low at 20-25%. Plan for gen change once devices meets ERI.   Plan 3-4 month follow up for battery check and scheduling.   Ozell Jodie Passey, PA-C  08/22/2024 12:03 PM

## 2024-09-11 ENCOUNTER — Observation Stay (HOSPITAL_COMMUNITY)

## 2024-09-11 ENCOUNTER — Encounter (HOSPITAL_COMMUNITY): Payer: Self-pay | Admitting: Pharmacy Technician

## 2024-09-11 ENCOUNTER — Inpatient Hospital Stay (HOSPITAL_COMMUNITY)
Admission: EM | Admit: 2024-09-11 | Discharge: 2024-09-14 | DRG: 291 | Disposition: A | Attending: Internal Medicine | Admitting: Internal Medicine

## 2024-09-11 ENCOUNTER — Emergency Department (HOSPITAL_COMMUNITY)

## 2024-09-11 ENCOUNTER — Other Ambulatory Visit: Payer: Self-pay

## 2024-09-11 DIAGNOSIS — I5023 Acute on chronic systolic (congestive) heart failure: Secondary | ICD-10-CM | POA: Diagnosis present

## 2024-09-11 DIAGNOSIS — N179 Acute kidney failure, unspecified: Secondary | ICD-10-CM | POA: Diagnosis present

## 2024-09-11 DIAGNOSIS — I509 Heart failure, unspecified: Secondary | ICD-10-CM

## 2024-09-11 DIAGNOSIS — I10 Essential (primary) hypertension: Secondary | ICD-10-CM | POA: Diagnosis present

## 2024-09-11 DIAGNOSIS — E669 Obesity, unspecified: Secondary | ICD-10-CM | POA: Diagnosis present

## 2024-09-11 DIAGNOSIS — J9621 Acute and chronic respiratory failure with hypoxia: Secondary | ICD-10-CM | POA: Diagnosis present

## 2024-09-11 DIAGNOSIS — R918 Other nonspecific abnormal finding of lung field: Secondary | ICD-10-CM | POA: Diagnosis present

## 2024-09-11 DIAGNOSIS — Z95810 Presence of automatic (implantable) cardiac defibrillator: Secondary | ICD-10-CM | POA: Diagnosis present

## 2024-09-11 DIAGNOSIS — L308 Other specified dermatitis: Secondary | ICD-10-CM | POA: Diagnosis present

## 2024-09-11 DIAGNOSIS — E119 Type 2 diabetes mellitus without complications: Secondary | ICD-10-CM

## 2024-09-11 LAB — BASIC METABOLIC PANEL WITH GFR
Anion gap: 13 (ref 5–15)
BUN: 28 mg/dL — ABNORMAL HIGH (ref 8–23)
CO2: 27 mmol/L (ref 22–32)
Calcium: 9.1 mg/dL (ref 8.9–10.3)
Chloride: 103 mmol/L (ref 98–111)
Creatinine, Ser: 1.29 mg/dL — ABNORMAL HIGH (ref 0.44–1.00)
GFR, Estimated: 45 mL/min — ABNORMAL LOW (ref >60)
Glucose, Bld: 127 mg/dL — ABNORMAL HIGH (ref 70–99)
Potassium: 4 mmol/L (ref 3.5–5.1)
Sodium: 143 mmol/L (ref 135–145)

## 2024-09-11 LAB — CBC WITH DIFFERENTIAL/PLATELET
Abs Immature Granulocytes: 0.03 K/uL (ref 0.00–0.07)
Basophils Absolute: 0.1 K/uL (ref 0.0–0.1)
Basophils Relative: 1 %
Eosinophils Absolute: 0.2 K/uL (ref 0.0–0.5)
Eosinophils Relative: 2 %
HCT: 41.1 % (ref 36.0–46.0)
Hemoglobin: 12.2 g/dL (ref 12.0–15.0)
Immature Granulocytes: 0 %
Lymphocytes Relative: 14 %
Lymphs Abs: 1.2 K/uL (ref 0.7–4.0)
MCH: 25.7 pg — ABNORMAL LOW (ref 26.0–34.0)
MCHC: 29.7 g/dL — ABNORMAL LOW (ref 30.0–36.0)
MCV: 86.5 fL (ref 80.0–100.0)
Monocytes Absolute: 0.8 K/uL (ref 0.1–1.0)
Monocytes Relative: 8 %
Neutro Abs: 6.8 K/uL (ref 1.7–7.7)
Neutrophils Relative %: 75 %
Platelets: 232 K/uL (ref 150–400)
RBC: 4.75 MIL/uL (ref 3.87–5.11)
RDW: 16 % — ABNORMAL HIGH (ref 11.5–15.5)
WBC: 9 K/uL (ref 4.0–10.5)
nRBC: 0.2 % (ref 0.0–0.2)

## 2024-09-11 LAB — TROPONIN I (HIGH SENSITIVITY): Troponin I (High Sensitivity): 34 ng/L — ABNORMAL HIGH (ref <18)

## 2024-09-11 LAB — BRAIN NATRIURETIC PEPTIDE: B Natriuretic Peptide: 427.7 pg/mL — ABNORMAL HIGH (ref 0.0–100.0)

## 2024-09-11 MED ORDER — GUAIFENESIN ER 600 MG PO TB12
600.0000 mg | ORAL_TABLET | Freq: Two times a day (BID) | ORAL | Status: DC
  Administered 2024-09-11 – 2024-09-14 (×6): 600 mg via ORAL
  Filled 2024-09-11 (×6): qty 1

## 2024-09-11 MED ORDER — ATORVASTATIN CALCIUM 80 MG PO TABS
80.0000 mg | ORAL_TABLET | Freq: Every day | ORAL | Status: DC
  Administered 2024-09-11 – 2024-09-13 (×3): 80 mg via ORAL
  Filled 2024-09-11: qty 2
  Filled 2024-09-11 (×2): qty 1

## 2024-09-11 MED ORDER — ALBUTEROL SULFATE (2.5 MG/3ML) 0.083% IN NEBU: 3.0000 mL | INHALATION_SOLUTION | Freq: Four times a day (QID) | RESPIRATORY_TRACT | Status: DC | PRN

## 2024-09-11 MED ORDER — APIXABAN 5 MG PO TABS
5.0000 mg | ORAL_TABLET | Freq: Two times a day (BID) | ORAL | Status: DC
  Administered 2024-09-11 – 2024-09-14 (×6): 5 mg via ORAL
  Filled 2024-09-11 (×6): qty 1

## 2024-09-11 MED ORDER — IPRATROPIUM-ALBUTEROL 0.5-2.5 (3) MG/3ML IN SOLN
3.0000 mL | Freq: Once | RESPIRATORY_TRACT | Status: AC
  Administered 2024-09-11: 3 mL via RESPIRATORY_TRACT
  Filled 2024-09-11: qty 3

## 2024-09-11 MED ORDER — SPIRONOLACTONE 12.5 MG HALF TABLET
12.5000 mg | ORAL_TABLET | Freq: Every day | ORAL | Status: DC
  Administered 2024-09-11 – 2024-09-14 (×4): 12.5 mg via ORAL
  Filled 2024-09-11 (×4): qty 1

## 2024-09-11 MED ORDER — FUROSEMIDE 10 MG/ML IJ SOLN
80.0000 mg | Freq: Once | INTRAMUSCULAR | Status: AC
  Administered 2024-09-12: 80 mg via INTRAVENOUS
  Filled 2024-09-11: qty 8

## 2024-09-11 MED ORDER — INSULIN GLARGINE 100 UNIT/ML ~~LOC~~ SOLN
5.0000 [IU] | Freq: Every day | SUBCUTANEOUS | Status: DC
  Administered 2024-09-13 – 2024-09-14 (×2): 5 [IU] via SUBCUTANEOUS
  Filled 2024-09-11 (×3): qty 0.05

## 2024-09-11 MED ORDER — IPRATROPIUM-ALBUTEROL 0.5-2.5 (3) MG/3ML IN SOLN
3.0000 mL | Freq: Four times a day (QID) | RESPIRATORY_TRACT | Status: DC
  Administered 2024-09-11 – 2024-09-13 (×6): 3 mL via RESPIRATORY_TRACT
  Filled 2024-09-11 (×6): qty 3

## 2024-09-11 MED ORDER — ACETAMINOPHEN 325 MG PO TABS
650.0000 mg | ORAL_TABLET | Freq: Four times a day (QID) | ORAL | Status: DC | PRN
  Administered 2024-09-11 – 2024-09-13 (×2): 650 mg via ORAL
  Filled 2024-09-11 (×3): qty 2

## 2024-09-11 MED ORDER — FUROSEMIDE 10 MG/ML IJ SOLN: 40.0000 mg | Freq: Once | INTRAMUSCULAR | Status: DC

## 2024-09-11 MED ORDER — SENNOSIDES-DOCUSATE SODIUM 8.6-50 MG PO TABS: 1.0000 | ORAL_TABLET | Freq: Every evening | ORAL | Status: DC | PRN

## 2024-09-11 MED ORDER — UMECLIDINIUM-VILANTEROL 62.5-25 MCG/ACT IN AEPB
1.0000 | INHALATION_SPRAY | Freq: Every day | RESPIRATORY_TRACT | Status: DC
  Administered 2024-09-14: 1 via RESPIRATORY_TRACT
  Filled 2024-09-11 (×3): qty 14

## 2024-09-11 MED ORDER — INSULIN ASPART 100 UNIT/ML IJ SOLN
0.0000 [IU] | Freq: Three times a day (TID) | INTRAMUSCULAR | Status: DC
  Administered 2024-09-12: 2 [IU] via SUBCUTANEOUS
  Administered 2024-09-13: 3 [IU] via SUBCUTANEOUS
  Administered 2024-09-13: 8 [IU] via SUBCUTANEOUS
  Administered 2024-09-13: 2 [IU] via SUBCUTANEOUS
  Administered 2024-09-14 (×2): 3 [IU] via SUBCUTANEOUS
  Filled 2024-09-11 (×4): qty 1
  Filled 2024-09-11: qty 3
  Filled 2024-09-11: qty 2

## 2024-09-11 MED ORDER — ACETAMINOPHEN 650 MG RE SUPP: 650.0000 mg | Freq: Four times a day (QID) | RECTAL | Status: DC | PRN

## 2024-09-11 MED ORDER — FUROSEMIDE 10 MG/ML IJ SOLN
80.0000 mg | Freq: Once | INTRAMUSCULAR | Status: AC
  Administered 2024-09-11: 80 mg via INTRAVENOUS
  Filled 2024-09-11: qty 8

## 2024-09-11 MED ORDER — BISOPROLOL FUMARATE 5 MG PO TABS
2.5000 mg | ORAL_TABLET | Freq: Every day | ORAL | Status: DC
  Administered 2024-09-11 – 2024-09-14 (×3): 2.5 mg via ORAL
  Filled 2024-09-11: qty 1
  Filled 2024-09-11: qty 0.5
  Filled 2024-09-11 (×2): qty 1
  Filled 2024-09-11: qty 0.5

## 2024-09-11 NOTE — ED Notes (Signed)
 This nurse noted that patient's oxygen  saturation was reading 88-89%. This nurse assessed patient, noted that patient was not in any distress. This nurse assessed that patient was on 0.5L of oxygen  via nasal cannula. This nurse turned up oxygen  to 2L of oxygen . Patient's oxygen  saturation now at 96%. Patient comfortable and resting with call light within reach.

## 2024-09-11 NOTE — ED Notes (Addendum)
 This nurse received a message from [facility], DO stating that she was comfortable with the patient's oxygen  saturation resting between 88-90% due to her history of COPD.

## 2024-09-11 NOTE — ED Triage Notes (Signed)
 Pt bib ems from home with shob for the last 10 days. FD got room at 84%, up to 97% on 2L [place]. Pt does wear oxygen  at night. The last few days has been using PRN. Diminished L upper lobe, fine crackles L lower. Pt has noticed increased extremity edema. Pt also with bil wrist pain, sore to touch, sore to move, onset yesterday.  160/42 HR 90's RR 20

## 2024-09-11 NOTE — ED Provider Notes (Signed)
 [place] EMERGENCY DEPARTMENT AT [facility] Provider Note   CSN: [id number] Arrival date & time: 09/11/24  1724     Patient presents with: Shortness of Breath   [name] is a 73 y.o. female.   73 year old female presents for evaluation of shortness of breath.  States is about for a week getting worse.  She usually only has to use her oxygen  at night but was 84% on room air on EMS arrival.  Now on 4 L nasal cannula.  She does seem tachypneic.  States she has had more coughing but no fevers.  Has a history of COPD and CHF.        Prior to Admission medications   Medication Sig Start Date End Date Taking? Authorizing Provider  albuterol  (VENTOLIN  HFA) 108 (90 Base) MCG/ACT inhaler INHALE 2 PUFFS INTO THE LUNGS EVERY 6 HOURS AS NEEDED FOR WHEEZING OR SHORTNESS OF BREATH 08/22/24  Yes Amoako, Prince, MD  apixaban  (ELIQUIS ) 5 MG TABS tablet Take 1 tablet (5 mg total) by mouth 2 (two) times daily. 07/07/24  Yes Lyles, Viktoria, DO  atorvastatin  (LIPITOR ) 80 MG tablet Take 1 tablet (80 mg total) by mouth at bedtime. 03/26/24  Yes Zheng, Michael, DO  bisoprolol  (ZEBETA ) 5 MG tablet Take 0.5 tablets (2.5 mg total) by mouth daily. 06/06/24  Yes Lyles, Viktoria, DO  insulin  degludec (TRESIBA ) 100 UNIT/ML FlexTouch Pen Inject 5 Units into the skin daily. 04/17/24  Yes D'Mello, Rosalyn, DO  ketoconazole  (NIZORAL ) 2 % shampoo Apply 1 Application topically once a week. 10/16/23  Yes [provider]  levocetirizine (XYZAL ) 5 MG tablet Take 1 tablet (5 mg total) by mouth at bedtime. Patient taking differently: Take 5 mg by mouth at bedtime as needed for allergies. 03/25/24  Yes Elicia Sharper, DO  OXYGEN  Inhale 2 L into the lungs at bedtime.   Yes [provider]  Semaglutide , 2 MG/DOSE, (OZEMPIC , 2 MG/DOSE,) 8 MG/3ML SOPN Inject 2 mg into the skin once a week. 06/19/24  Yes Lyles, Viktoria, DO  spironolactone  (ALDACTONE ) 25 MG tablet Take 0.5 tablets (12.5 mg total) by mouth  daily. 06/05/24  Yes Peggy Viktoria, DO  torsemide  (DEMADEX ) 20 MG tablet Take 1 tablet by mouth as needed, take a dose tomorrow 05/22/24 and Fri 05/24/24 if still needed. 05/21/24  Yes Lesia Sharper Barter, PA-C  triamcinolone  ointment (KENALOG ) 0.1 % APPLY TOPICALLY TWICE DAILY MIX WITH EUCERIN 08/19/24  Yes Amoako, Prince, MD  umeclidinium-vilanterol (ANORO ELLIPTA ) 62.5-25 MCG/ACT AEPB Inhale 1 puff into the lungs daily. 06/05/24  Yes Lyles, Viktoria, DO  Continuous Glucose Sensor (DEXCOM G7 SENSOR) MISC Change sensor every 10 days 07/04/24   Peggy Viktoria, DO    Allergies: Actos [pioglitazone], Naproxen , Rosiglitazone, Metformin  and related, Tomato, Hydrocodone -acetaminophen , and Hydrocortisone    Review of Systems  Constitutional:  Negative for chills and fever.  HENT:  Negative for ear pain and sore throat.   Eyes:  Negative for pain and visual disturbance.  Respiratory:  Positive for cough and shortness of breath.   Cardiovascular:  Negative for chest pain and palpitations.  Gastrointestinal:  Negative for abdominal pain and vomiting.  Genitourinary:  Negative for dysuria and hematuria.  Musculoskeletal:  Negative for arthralgias and back pain.  Skin:  Negative for color change and rash.  Neurological:  Negative for seizures and syncope.  All other systems reviewed and are negative.   Updated Vital Signs BP (!) 146/75 (BP Location: Right Arm)   Pulse (!) 132  Temp 98.2 F (36.8 C) (Oral)   Resp (!) 28   SpO2 98%   Physical Exam Vitals and nursing note reviewed.  Constitutional:      General: She is not in acute distress.    Appearance: Normal appearance. She is well-developed. She is not ill-appearing.  HENT:     Head: Normocephalic and atraumatic.  Eyes:     Conjunctiva/sclera: Conjunctivae normal.  Cardiovascular:     Rate and Rhythm: Normal rate and regular rhythm.     Heart sounds: No murmur heard. Pulmonary:     Effort: No respiratory distress.     Breath sounds:  Wheezing and rhonchi present.     Comments: tachypneic Abdominal:     Palpations: Abdomen is soft.     Tenderness: There is no abdominal tenderness.  Musculoskeletal:        General: No swelling.     Cervical back: Neck supple.     Right lower leg: Edema present.     Left lower leg: Edema present.  Skin:    General: Skin is warm and dry.     Capillary Refill: Capillary refill takes less than 2 seconds.  Neurological:     Mental Status: She is alert.  Psychiatric:        Mood and Affect: Mood normal.     (all labs ordered are listed, but only abnormal results are displayed) Labs Reviewed  BASIC METABOLIC PANEL WITH GFR - Abnormal; Notable for the following components:      Result Value   Glucose, Bld 127 (*)    BUN 28 (*)    Creatinine, Ser 1.29 (*)    GFR, Estimated 45 (*)    All other components within normal limits  CBC WITH DIFFERENTIAL/PLATELET - Abnormal; Notable for the following components:   MCH 25.7 (*)    MCHC 29.7 (*)    RDW 16.0 (*)    All other components within normal limits  BRAIN NATRIURETIC PEPTIDE - Abnormal; Notable for the following components:   B Natriuretic Peptide 427.7 (*)    All other components within normal limits  TROPONIN I (HIGH SENSITIVITY) - Abnormal; Notable for the following components:   Troponin I (High Sensitivity) 34 (*)    All other components within normal limits  RESP PANEL BY RT-PCR (RSV, FLU A&B, COVID)  RVPGX2  CBC  BASIC METABOLIC PANEL WITH GFR  MAGNESIUM   MAGNESIUM   TROPONIN I (HIGH SENSITIVITY)    EKG: None  Radiology: DG Wrist 2 Views Left Result Date: 09/11/2024 CLINICAL DATA:  Wrist pain EXAM: LEFT WRIST - 2 VIEW; RIGHT WRIST - 2 VIEW COMPARISON:  11/03/2013 FINDINGS: Left wrist: Frontal and lateral views are obtained. No acute fracture, subluxation, or dislocation. Mild osteoarthritis greatest in the radial aspect of the carpus. Soft tissues are unremarkable. Right wrist: Frontal and lateral views are obtained.  No acute fracture, subluxation, or dislocation. Mild osteoarthritis greatest in the radial aspect of the carpus. Soft tissues are unremarkable. IMPRESSION: 1. Mild osteoarthritis within the bilateral wrists. No acute bony abnormalities. Electronically Signed   By: Ozell Daring M.D.   On: 09/11/2024 21:43   DG Wrist 2 Views Right Result Date: 09/11/2024 CLINICAL DATA:  Wrist pain EXAM: LEFT WRIST - 2 VIEW; RIGHT WRIST - 2 VIEW COMPARISON:  11/03/2013 FINDINGS: Left wrist: Frontal and lateral views are obtained. No acute fracture, subluxation, or dislocation. Mild osteoarthritis greatest in the radial aspect of the carpus. Soft tissues are unremarkable. Right wrist: Frontal and lateral views  are obtained. No acute fracture, subluxation, or dislocation. Mild osteoarthritis greatest in the radial aspect of the carpus. Soft tissues are unremarkable. IMPRESSION: 1. Mild osteoarthritis within the bilateral wrists. No acute bony abnormalities. Electronically Signed   By: Ozell Daring M.D.   On: 09/11/2024 21:43   DG Chest 1 View Result Date: 09/11/2024 CLINICAL DATA:  Shortness of breath for 10 days, hypoxia EXAM: CHEST  1 VIEW COMPARISON:  06/24/2024 FINDINGS: Single frontal view of the chest demonstrates stable single lead pacer/AICD. Cardiac silhouette is enlarged. Lung volumes are diminished, with pulmonary vascular congestion and bilateral interstitial and ground-glass opacities consistent with edema. There are bilateral pleural effusions, left greater than right, with likely underlying lower lobe atelectasis. No pneumothorax. IMPRESSION: 1. Constellation of findings most consistent with congestive heart failure and pulmonary edema. Electronically Signed   By: Ozell Daring M.D.   On: 09/11/2024 18:11     .Critical Care  Performed by: Gennaro Duwaine CROME, DO Authorized by: Gennaro Duwaine CROME, DO   Critical care provider statement:    Critical care time (minutes):  35   Critical care time was  exclusive of:  Separately billable procedures and treating other patients and teaching time   Critical care was necessary to treat or prevent imminent or life-threatening deterioration of the following conditions:  Respiratory failure   Critical care was time spent personally by me on the following activities:  Development of treatment plan with patient or surrogate, discussions with consultants, evaluation of patient's response to treatment, examination of patient, ordering and review of laboratory studies, ordering and review of radiographic studies, ordering and performing treatments and interventions, pulse oximetry, re-evaluation of patient's condition and review of old charts   Care discussed with: admitting provider      Medications Ordered in the ED  acetaminophen  (TYLENOL ) tablet 650 mg (650 mg Oral Given 09/11/24 2234)    Or  acetaminophen  (TYLENOL ) suppository 650 mg ( Rectal See Alternative 09/11/24 2234)  senna-docusate (Senokot-S) tablet 1 tablet (has no administration in time range)  guaiFENesin  (MUCINEX ) 12 hr tablet 600 mg (600 mg Oral Given 09/11/24 2231)  ipratropium-albuterol  (DUONEB) 0.5-2.5 (3) MG/3ML nebulizer solution 3 mL (3 mLs Nebulization Given 09/11/24 2234)  apixaban  (ELIQUIS ) tablet 5 mg (5 mg Oral Given 09/11/24 2231)  atorvastatin  (LIPITOR ) tablet 80 mg (80 mg Oral Given 09/11/24 2231)  bisoprolol  (ZEBETA ) tablet 2.5 mg (2.5 mg Oral Given 09/11/24 2232)  insulin  glargine (LANTUS ) injection 5 Units (has no administration in time range)  spironolactone  (ALDACTONE ) tablet 12.5 mg (12.5 mg Oral Given 09/11/24 2231)  furosemide  (LASIX ) injection 80 mg (has no administration in time range)  insulin  aspart (novoLOG ) injection 0-15 Units (has no administration in time range)  albuterol  (PROVENTIL ) (2.5 MG/3ML) 0.083% nebulizer solution 3 mL (has no administration in time range)  umeclidinium-vilanterol (ANORO ELLIPTA ) 62.5-25 MCG/ACT 1 puff (has no administration in time  range)  ipratropium-albuterol  (DUONEB) 0.5-2.5 (3) MG/3ML nebulizer solution 3 mL (3 mLs Nebulization Given 09/11/24 1824)  furosemide  (LASIX ) injection 80 mg (80 mg Intravenous Given 09/11/24 2233)                                    Medical Decision Making Cardiac monitor interpretation: Sinus tachycardia, no ectopy  Patient here for acute hypoxic respiratory failure.  Only uses nasal cannula at nighttime but now she is on 4 L that she was found to be 84% on room  air.  Has a history of COPD and was given a DuoNeb.  This did help her tachypnea.  Also found to have some swelling in her legs as well as edema in her lungs and slightly elevated BNP and troponin.  Was given Lasix .  Discussed patient's case with resident for IM teaching service and patient be admitted for further workup and management.  Patient is agreeable to plan for admission  Problems Addressed: Acute hypoxic respiratory failure (HCC): acute illness or injury that poses a threat to life or bodily functions Acute on chronic congestive heart failure, unspecified heart failure type [facility]): acute illness or injury that poses a threat to life or bodily functions Chronic obstructive pulmonary disease, unspecified COPD type (HCC): chronic illness or injury with exacerbation, progression, or side effects of treatment  Amount and/or Complexity of Data Reviewed External Data Reviewed: notes.    Details: Prior ED records reviewed and patient seen 06-24-2024 for COPD exacerbation Labs: ordered. Decision-making details documented in ED Course.    Details: Ordered and reviewed by me and BNP and troponin are elevated Radiology: ordered and independent interpretation performed. Decision-making details documented in ED Course.    Details: Ordered and interpreted by me independently of radiology  chest x-ray shows evidence of vascular congestion and cardiomegaly ECG/medicine tests: ordered and independent interpretation performed.  Decision-making details documented in ED Course.    Details: Ordered and interpreted by me in the absence of cardiology and shows sinus rhythm, no STEMI, or significant change when compared to prior EKG Discussion of management or test interpretation with external provider(s): Dr. Harrie - IM Resident -discussed patient's case with him and patient will be admitted for further workup and management  Risk OTC drugs. Prescription drug management. Drug therapy requiring intensive monitoring for toxicity. Decision regarding hospitalization. Risk Details: CRITICAL CARE Performed by: Duwaine LITTIE Fusi   Total critical care time: 35 minutes  Critical care time was exclusive of separately billable procedures and treating other patients.  Critical care was necessary to treat or prevent imminent or life-threatening deterioration.  Critical care was time spent personally by me on the following activities: development of treatment plan with patient and/or surrogate as well as nursing, discussions with consultants, evaluation of patient's response to treatment, examination of patient, obtaining history from patient or surrogate, ordering and performing treatments and interventions, ordering and review of laboratory studies, ordering and review of radiographic studies, pulse oximetry and re-evaluation of patient's condition.   Critical Care Total time providing critical care: 35 minutes    Final diagnoses:  Acute hypoxic respiratory failure (HCC)  Acute on chronic congestive heart failure, unspecified heart failure type [facility])  Chronic obstructive pulmonary disease, unspecified COPD type [facility])    ED Discharge Orders     None          Fusi Duwaine LITTIE, DO 09/11/24 2340

## 2024-09-11 NOTE — ED Notes (Signed)
 CCMD called.

## 2024-09-11 NOTE — Hospital Course (Addendum)
 Acute on chronic hypoxic respiratory failure Secondary to acute on chronic HFrEF exacerbation COPD Patient came in with shortness of breath for around the last 9 days.  She did have some new cough and sputum production with sputum production noticed very on and off.  Did not feel the patient had full-blown COPD exacerbation but we did start her on prednisone  20 to reduce any inflammation.  So treating her with DuoNebs and Anoro Ellipta  inhaler with albuterol  as needed.  Also gave patient Mucinex ..  We need her GDMT as far as bisoprolol , spironolactone  while inpatient.  Gave her IV diuresis 80 mg x 2.  Bedside POCUS was done and IVC was noted to be collapsible.  Patient does have some residual crackles in the bases of her lungs bilaterally but I do think this could also be due to atelectasis.  Overall she appears to be euvolemic at the time of discharge.  Did transition her to home torsemide  20 mg.  She was taking this previously as a as needed but we have now made it on a Monday Wednesday Friday schedule.  Bilateral Wrist Pain  Psoriasis without hx of psoriatic arthritis  Patient did come in with bilateral wrist pain.  No history of head trauma.  We did get x-rays of bilateral wrist that did not show any acute fractures but did show some osteoarthritis.  Did note though on physical exam the right wrist appears to be edematous and tender to touch.  The subsequent day did notice that middle finger on right side was edematous warm to the touch and PIP joint was tender.  With her history of psoriasis she is at increased risk for gout.  Lower differential for infection with her overall picture being afebrile.  No history of gout or psoriatic arthritis.  With her kidney function to decide to treat empirically with prednisone  20 mg.  After 1 dose did notice decrease in edema in wrist as well as no tenderness to touch.  Will send patient home on prednisone  20 for another 5 days.  Sed rate was elevated at  67.  Paroxysmal atrial flutter Appears to be in sinus rhythm at this time.   - Apixaban  5 mg twice daily -Continue bisoprolol  2.5 mg daily   Recent decrease in physical function Could be likely mixed due to shortness of breath and due to family stressors in her life.  She has been relying on a cane and walker at times - Patient was able to speak to chaplain. Appreciate their help   - PT/OT assessed and recommended SNF but patient declined.  Did order home health for  #T2DM Blood sugars have been in range while here in the hospital.  Will watch blood sugars in setting of prednisone . - Last A1c was 8.6 - Glargine 5 units daily - Sliding scale insulin  ordered - Carb modified diet   #CKD 4 Need to be watched in the setting of diuresis. -Continue to monitor with BMP - Cr 1.64 at time of discharge. Needs to be followed up with BMP outpatient. Likely due to diuresis  HTN BP has been more normotensive throughout.  - Management as per HFrEF   HLD Lipid panel 01/2024: Cholesterol 194, TG 66, HDL 59, LDL 122 - Atorvastatin  80 mg   Tobacco Use Disorder Current smoker. Cutting down on smoking (previously 3-5 cigarettes a day, now 1-2).   Multiple Lung Nodules incidentally noted on CT 04/2024 - CT chest was ordered as an outpatient and it was then performed  inpatient before patient complete.  Read is pending at the time of discharge.  Please follow-up on this in the outpatient setting

## 2024-09-11 NOTE — H&P (Cosign Needed Addendum)
 Date: 09/11/2024               Patient Name:  [name] MRN: [id number]  DOB: 05-25-1951 Age / Sex: 73 y.o., female   PCP: [name], MD         Medical Service: Internal Medicine Teaching Service         Attending Physician: Dr. Mliss Pouch      First Contact: Schuyler Novak, DO    Second Contact: Dr. [name], MD         Pager Information: First Contact Pager: [phone]   Second Contact Pager: [phone]   SUBJECTIVE   Chief Complaint: SOB  History of Present Illness: [name] is a 73 y.o. female with PMH of HFrEF (EF 20-25%) s/p ICD, COPD, CKD stage IV, T2DM, HTN, paroxysmal a flutter, psoriasis, tobacco use disorder presented to the ED with shortness of breath for around 9 days.  Cannot recall an inciting event, remembers sitting on the couch and feeling SOB. Denies any sick contacts, however her granddaughter unfortunately went into the hospital for heart attack and she went to go visit her recently.  Otherwise, she said she has been essentially bedbound/chair bound over the past 2 weeks, laying down all the time because I just was and stated that the SOB could have played a small part. Uses oxygen  usually only at bedtime (2 L) but had been using it during the day for the past week. Has as needed torsemide  20 mg tablets which she has had to take daily for the last 3 days without much improvement.  Otherwise she takes torsemide  about once or twice a week.  Patient stated that she feels similarly to 1 month ago when she was seen in the ED for COPD exacerbation.  Additionally complaining of bilateral wrist pain.  Has history of psoriasis, but no history of psoriatic arthritis, other autoimmune disease, history of gout.  Denies trauma/fall.  Pain is worse with palpation and ROM.  No pain at rest. Denies redness or warmth.   Review of Systems  Constitutional:  Positive for chills. Negative for fever.  Respiratory:  Positive for cough (1.5 weeks), sputum production (1.5  weeks) and shortness of breath (1.5 weeks).   Cardiovascular:  Positive for orthopnea (1.5 weeks) and leg swelling (1.5 weeks). Negative for chest pain.  Gastrointestinal:  Negative for abdominal pain, constipation, diarrhea, nausea and vomiting.  Genitourinary:  Negative for dysuria and frequency.  Musculoskeletal:  Positive for joint pain (bilateral wrist pain. 1-2 days). Negative for falls and myalgias.  Neurological:  Negative for dizziness, focal weakness and headaches.     ED Course: Vitals: Presented to ED on 4 L [place] Labs significant for  - BMP: Creatinine 1.29, BUN 28, potassium 4 - Troponin 34 - CBC: WBC 9.0 - BNP 427.7 - Respiratory panel pending Imaging  - CXR: Findings most consistent with congestive heart failure and pulmonary edema Received: DuoNeb Consulted: IMTS  Past Medical History PCP: [name], MD  Chronic Combined CHF (04/14/2024 echo: EF 20-25%) S/p ICD (placed 2008) COPD CKD stage IV T2DM HTN Paroxysmal a flutter Psoriasis Tobacco use disorder  Meds: Patient reported:  - Albuterol  inhaler - Umeclidinium-Vilanterol inhaler 1 puff daily  - Apixaban  5 mg twice daily - Atorvastatin  80 mg daily - Bisoprolol  2.5 mg daily - Tresiba  5 units daily - Ketoconazole  shampoo - Xyzal  5 mg as needed allergies - Oxygen  2 L at bedtime - Semaglutide  2 mg weekly - Triamcinolone  ointment  -  Spironolactone  12.5 mg daily - Torsemide  20 mg as needed  Current Meds  Medication Sig   albuterol  (VENTOLIN  HFA) 108 (90 Base) MCG/ACT inhaler INHALE 2 PUFFS INTO THE LUNGS EVERY 6 HOURS AS NEEDED FOR WHEEZING OR SHORTNESS OF BREATH   apixaban  (ELIQUIS ) 5 MG TABS tablet Take 1 tablet (5 mg total) by mouth 2 (two) times daily.   atorvastatin  (LIPITOR ) 80 MG tablet Take 1 tablet (80 mg total) by mouth at bedtime.   bisoprolol  (ZEBETA ) 5 MG tablet Take 0.5 tablets (2.5 mg total) by mouth daily.   insulin  degludec (TRESIBA ) 100 UNIT/ML FlexTouch Pen Inject 5 Units into the  skin daily.   ketoconazole  (NIZORAL ) 2 % shampoo Apply 1 Application topically once a week.   levocetirizine (XYZAL ) 5 MG tablet Take 1 tablet (5 mg total) by mouth at bedtime. (Patient taking differently: Take 5 mg by mouth at bedtime as needed for allergies.)   OXYGEN  Inhale 2 L into the lungs at bedtime.   Semaglutide , 2 MG/DOSE, (OZEMPIC , 2 MG/DOSE,) 8 MG/3ML SOPN Inject 2 mg into the skin once a week.   spironolactone  (ALDACTONE ) 25 MG tablet Take 0.5 tablets (12.5 mg total) by mouth daily.   torsemide  (DEMADEX ) 20 MG tablet Take 1 tablet by mouth as needed, take a dose tomorrow 05/22/24 and Fri 05/24/24 if still needed.   triamcinolone  ointment (KENALOG ) 0.1 % APPLY TOPICALLY TWICE DAILY MIX WITH EUCERIN   umeclidinium-vilanterol (ANORO ELLIPTA ) 62.5-25 MCG/ACT AEPB Inhale 1 puff into the lungs daily.    Past Surgical History Past Surgical History:  Procedure Laterality Date   ABDOMINAL HYSTERECTOMY     CARDIAC CATHETERIZATION     CARDIAC CATHETERIZATION N/A 06/21/2016   Procedure: Left Heart Cath and Coronary Angiography;  Surgeon: Ozell Fell, MD;  Location: [facility] INVASIVE CV LAB;  Service: Cardiovascular;  Laterality: N/A;   CARDIAC DEFIBRILLATOR PLACEMENT  05/04/2007   SJM Atlas II VR ICD   CARDIAC DEFIBRILLATOR PLACEMENT     CATARACT EXTRACTION     od   COLONOSCOPY N/A 04/12/2013   Procedure: COLONOSCOPY;  Surgeon: Belvie JONETTA Just, MD;  Location: [facility] ENDOSCOPY;  Service: Endoscopy;  Laterality: N/A;  pt.has defibrilator   COLONOSCOPY N/A 04/16/2015   Procedure: COLONOSCOPY;  Surgeon: Toribio SHAUNNA Cedar, MD;  Location: [facility] ENDOSCOPY;  Service: Endoscopy;  Laterality: N/A;   COLONOSCOPY WITH PROPOFOL  N/A 07/21/2023   Procedure: COLONOSCOPY WITH PROPOFOL ;  Surgeon: Just Belvie, MD;  Location: [facility] ENDOSCOPY;  Service: Gastroenterology;  Laterality: N/A;   ESOPHAGOGASTRODUODENOSCOPY N/A 04/16/2015   Procedure: ESOPHAGOGASTRODUODENOSCOPY (EGD);  Surgeon: Toribio SHAUNNA Cedar, MD;  Location: [facility];  Service: Endoscopy;  Laterality: N/A;   ESOPHAGOGASTRODUODENOSCOPY (EGD) WITH PROPOFOL  N/A 11/25/2022   Procedure: ESOPHAGOGASTRODUODENOSCOPY (EGD) WITH PROPOFOL ;  Surgeon: Just Belvie, MD;  Location: [facility] ENDOSCOPY;  Service: Gastroenterology;  Laterality: N/A;   EYE SURGERY     HERNIA REPAIR     HYSTEROSCOPY     IMPLANTABLE CARDIOVERTER DEFIBRILLATOR (ICD) GENERATOR CHANGE N/A 04/02/2014   Procedure: ICD GENERATOR CHANGE;  Surgeon: Danelle LELON Birmingham, MD;  Location: [facility] CATH LAB;  Service: Cardiovascular;  Laterality: N/A;   INSERT / REPLACE / REMOVE PACEMAKER     LEFT HEART CATHETERIZATION WITH CORONARY ANGIOGRAM N/A 04/02/2012   Procedure: LEFT HEART CATHETERIZATION WITH CORONARY ANGIOGRAM;  Surgeon: Debby JONETTA Como, MD;  Location: [facility] CATH LAB;  Service: Cardiovascular;  Laterality: N/A;   ORIF ANKLE FRACTURE Right 08/12/2019   Procedure: OPEN REDUCTION INTERNAL FIXATION (ORIF) RIGHT TRIMALLEOLAR ANKLE FRACTURE;  Surgeon: Jerri Kay HERO, MD;  Location: [facility] OR;  Service: Orthopedics;  Laterality: Right;   RIGHT HEART CATH N/A 08/03/2022   Procedure: RIGHT HEART CATH;  Surgeon: Cherrie Toribio SAUNDERS, MD;  Location: [facility] INVASIVE CV LAB;  Service: Cardiovascular;  Laterality: N/A;   SAVORY DILATION N/A 11/25/2022   Procedure: SAVORY DILATION;  Surgeon: Rollin Dover, MD;  Location: [facility] ENDOSCOPY;  Service: Gastroenterology;  Laterality: N/A;   TEE WITHOUT CARDIOVERSION N/A 06/23/2022   Procedure: TRANSESOPHAGEAL ECHOCARDIOGRAM (TEE);  Surgeon: Loni Soyla LABOR, MD;  Location: [facility] ENDOSCOPY;  Service: Cardiology;  Laterality: N/A;   TONSILLECTOMY     TUBAL LIGATION      Social:  Lives With: alone in a house.  Occupation: retired Support: family and friends.  Level of Function: independent in all ADLs and IADLs. Walker and wheelchair PRN Substances: -Tobacco: 1-2 cigarettes a day. Last cigarette 09/10/2024. Smoking > 40 years (previously 3-5 cigarettes a day) -Alcohol: occasional  -Recreational  Drug: denied  Family History:  Family History  Problem Relation Age of Onset   Stroke Mother    Seizures Father    Heart disease Father    Diabetes Sister    Asthma Maternal Aunt        aunts   Asthma Maternal Uncle        uncles   Heart disease Paternal Aunt        aunts   Heart disease Paternal Uncle        uncles   Heart disease Maternal Aunt        aunts   Heart disease Maternal Uncle        uncles   Heart disease Maternal Grandfather    Breast cancer Daughter    Breast cancer Cousin    Asthma Grandchild    Colon cancer Neg Hx    Colon polyps Neg Hx    Esophageal cancer Neg Hx    Kidney disease Neg Hx    Gallbladder disease Neg Hx      Allergies: Allergies as of 09/11/2024 - Review Complete 09/11/2024  Allergen Reaction Noted   Actos [pioglitazone] Other (See Comments) 03/12/2009   Naproxen  Other (See Comments) 02/05/2016   Rosiglitazone Hives 02/11/2016   Metformin  and related  04/18/2023   Tomato Itching 06/16/2021   Hydrocodone -acetaminophen  Itching 08/08/2019   Hydrocortisone Hives and Itching 10/06/2009    Review of Systems: A complete ROS was negative except as per HPI.   OBJECTIVE:   Physical Exam: Blood pressure (!) 146/73, pulse 91, temperature 99 F (37.2 C), resp. rate (!) 32, SpO2 94% on RA.  Physical Exam Constitutional:      General: She is not in acute distress.    Appearance: She is not toxic-appearing.     Comments: Chronically ill appearing  HENT:     Head:     Comments: Well-demarcated silvery scaled plaque 1 inch, around suboccipital.  Neck:     Vascular: JVD present.  Cardiovascular:     Rate and Rhythm: Normal rate and regular rhythm.     Pulses:          Dorsalis pedis pulses are 1+ on the right side and 1+ on the left side.     Heart sounds: No murmur heard.    No friction rub. No gallop.  Pulmonary:     Effort: Pulmonary effort is normal. No accessory muscle usage or respiratory distress.     Breath sounds: Rhonchi  (throughout all posterior lung fields) present. No decreased  breath sounds, wheezing or rales.  Chest:     Chest wall: Tenderness (chest pain reproducible with palpation) present.  Abdominal:     General: Bowel sounds are normal.     Palpations: Abdomen is soft.     Tenderness: There is no abdominal tenderness. There is no guarding.  Musculoskeletal:     Right wrist: Swelling (+ warmth) and tenderness present. Decreased range of motion.     Left wrist: Swelling (+warmth) and tenderness present. Decreased range of motion.     Right lower leg: 2+ Pitting Edema present.     Left lower leg: 2+ Pitting Edema present.  Skin:    General: Skin is warm.  Neurological:     Mental Status: She is alert.      Labs: CBC    Component Value Date/Time   WBC 9.0 09/11/2024 1745   RBC 4.75 09/11/2024 1745   HGB 12.2 09/11/2024 1745   HGB 14.1 02/08/2017 1551   HCT 41.1 09/11/2024 1745   HCT 44.5 02/08/2017 1551   PLT 232 09/11/2024 1745   PLT 246 02/08/2017 1551   MCV 86.5 09/11/2024 1745   MCV 87 02/08/2017 1551   MCH 25.7 (L) 09/11/2024 1745   MCHC 29.7 (L) 09/11/2024 1745   RDW 16.0 (H) 09/11/2024 1745   RDW 16.7 (H) 02/08/2017 1551   LYMPHSABS 1.2 09/11/2024 1745   LYMPHSABS 2.6 02/08/2017 1551   MONOABS 0.8 09/11/2024 1745   EOSABS 0.2 09/11/2024 1745   EOSABS 0.3 02/08/2017 1551   BASOSABS 0.1 09/11/2024 1745   BASOSABS 0.0 02/08/2017 1551     CMP     Component Value Date/Time   NA 143 09/11/2024 1745   NA 144 06/19/2024 1156   K 4.0 09/11/2024 1745   CL 103 09/11/2024 1745   CO2 27 09/11/2024 1745   GLUCOSE 127 (H) 09/11/2024 1745   BUN 28 (H) 09/11/2024 1745   BUN 31 (H) 06/19/2024 1156   CREATININE 1.29 (H) 09/11/2024 1745   CREATININE 0.88 06/16/2016 1338   CALCIUM  9.1 09/11/2024 1745   PROT 7.5 06/24/2024 1850   PROT 6.9 02/08/2017 1551   ALBUMIN  3.6 06/24/2024 1850   ALBUMIN  4.1 02/08/2017 1551   AST 21 06/24/2024 1850   ALT 13 06/24/2024 1850   ALKPHOS 134  (H) 06/24/2024 1850   BILITOT 1.0 06/24/2024 1850   BILITOT 0.4 02/08/2017 1551   GFRNONAA 45 (L) 09/11/2024 1745   GFRNONAA 75 02/13/2015 1015   GFRAA 78 07/14/2020 1536   GFRAA 87 02/13/2015 1015    Imaging: DG Wrist 2 Views Left Result Date: 09/11/2024 CLINICAL DATA:  Wrist pain EXAM: LEFT WRIST - 2 VIEW; RIGHT WRIST - 2 VIEW COMPARISON:  11/03/2013 FINDINGS: Left wrist: Frontal and lateral views are obtained. No acute fracture, subluxation, or dislocation. Mild osteoarthritis greatest in the radial aspect of the carpus. Soft tissues are unremarkable. Right wrist: Frontal and lateral views are obtained. No acute fracture, subluxation, or dislocation. Mild osteoarthritis greatest in the radial aspect of the carpus. Soft tissues are unremarkable. IMPRESSION: 1. Mild osteoarthritis within the bilateral wrists. No acute bony abnormalities. Electronically Signed   By: Ozell Daring M.D.   On: 09/11/2024 21:43   DG Wrist 2 Views Right Result Date: 09/11/2024 CLINICAL DATA:  Wrist pain EXAM: LEFT WRIST - 2 VIEW; RIGHT WRIST - 2 VIEW COMPARISON:  11/03/2013 FINDINGS: Left wrist: Frontal and lateral views are obtained. No acute fracture, subluxation, or dislocation. Mild osteoarthritis greatest in the radial aspect of  the carpus. Soft tissues are unremarkable. Right wrist: Frontal and lateral views are obtained. No acute fracture, subluxation, or dislocation. Mild osteoarthritis greatest in the radial aspect of the carpus. Soft tissues are unremarkable. IMPRESSION: 1. Mild osteoarthritis within the bilateral wrists. No acute bony abnormalities. Electronically Signed   By: Ozell Daring M.D.   On: 09/11/2024 21:43   DG Chest 1 View Result Date: 09/11/2024 CLINICAL DATA:  Shortness of breath for 10 days, hypoxia EXAM: CHEST  1 VIEW COMPARISON:  06/24/2024 FINDINGS: Single frontal view of the chest demonstrates stable single lead pacer/AICD. Cardiac silhouette is enlarged. Lung volumes are diminished,  with pulmonary vascular congestion and bilateral interstitial and ground-glass opacities consistent with edema. There are bilateral pleural effusions, left greater than right, with likely underlying lower lobe atelectasis. No pneumothorax. IMPRESSION: 1. Constellation of findings most consistent with congestive heart failure and pulmonary edema. Electronically Signed   By: Ozell Daring M.D.   On: 09/11/2024 18:11     EKG: personally reviewed my interpretation is sinus rhythm with premature atrial complex.   ASSESSMENT & PLAN:   Assessment & Plan by Problem: Principal Problem:   Acute hypoxic respiratory failure (HCC) Active Problems:   Essential hypertension   Chronic combined systolic and diastolic heart failure (HCC)   ICD (implantable cardioverter-defibrillator) in place   DM (diabetes mellitus) type II controlled with renal manifestation (HCC)   Hyperlipidemia   CKD (chronic kidney disease) stage 4, GFR 15-29 ml/min (HCC)   COPD (chronic obstructive pulmonary disease) (HCC)   [name] is a 73 y.o. person living with a history of PMH of HFrEF (EF 20-25%) s/p ICD, COPD, CKD stage IV, T2DM, HTN, paroxysmal a flutter, psoriasis, tobacco use disorder who presented with SOB and admitted for acute hypoxic respiratory failure on hospital day 0  Acute Hypoxic Respiratory Failure HFrEF (chronic combined systolic and diastolic HF s/p ICD) COPD Warm and wet.  Last ICD check WNL. BNP 427.7.  By examination, saturating 88-90% on RA.  CXR: negative for pneumonia; findings most consistent with CHF and pulm edema.  Possible component of COPD exacerbation given increased cough and sputum production.  Allergy to hydrocortisone, will hold off on steroid for now given clinical improvement with duoneb.  Well's score 3 and patient on chronic anticoagulation with apixaban  5 mg twice daily for paroxysmal atrial flutter, hemodynamically stable-low suspicion for PE.   GDMT: - Beta blocker: Bisoprolol  2.5  mg daily - Mineralocorticoid receptor antagonist: Spironolactone  12.5 mg daily - SGLT2 inhibitor: Previously on Farxiga , was held s/p UTI 05/2024. - ACE/ARB/ARNI: None (had syncopal episode on lisinopril  2.5 mg) - Diuretic: Torsemide  20 mg as needed - Respiratory Viral Panel Pending  - Lasix  80 mg twice daily - DuoNeb q 6 hours ordered - Umeclidinium-Vilanterol inhaler ordered - albuterol  PRN ordered  - guaifenesin  600 mg BID ordered  - Consider restarting SGLT2 inhibitor prior to discharge. - monitor electrolytes: Goal K>4, Mg >2 - check SpO2 ambulating prior to d/c  Bilateral Wrist Pain  Psoriasis without hx of psoriatic arthritis  No recent trauma. Pain presented 2 days ago and is accompanied by decreased ROM, pain with ROM, pain with palpation.  No history of gout or psoriatic arthritis, but history of psoriasis.  Not currently on home medication management known to cause gout. Hx of elevated CRP 22.7 05/06/2024. Bilateral wrist xrays: mild osteoarthritis, no acute bony abnormalities and soft tissues are unremarkable. Causing patient considerable amount of pain. Imaging cannot rule out inflammatory arthritis, crystalline arthropathy,  or infection. Patient is afebrile, no leukocytosis. Warmth on physical exam possibly due to being under blanket, wrist slightly warmer compared to upper extremity, but at time of exam, difficult to say. On re-examination after tylenol  administration, patient stated that pain is improved, however still extremely tender to palpation.  - Consider arthrocentesis with ultrasound if pain persists, fever or leukocytosis develops.  - Tylenol  650 mg q 6 hours PRN pain ordered   Paroxysmal Atrial flutter  Sinus rhythm on EKG. During examination was having non-sustained vtach to 130s.  Had not taken bisoprolol  this morning.  Asymptomatic. - Cardiac telemetry ordered - Restarted bisoprolol  2.5 mg daily - Apixaban  5 mg twice daily  Recent Stasis   Patient stated that she  has been moving less over the last 2 weeks sitting more in the bed or in chair.  Still able to perform her ADLs and IADLs.  Denies myalgias or weakness.  Could be 2/2 SOB, but further questions regarding mental health and conditioning might be beneficial to patient prior to discharge. - PT OT ordered  T2DM Hemoglobin A1c 06/19/2024: 8.6.  Glucose on admission 127.  - Glargine 5 U daily ordered - sliding scale ordered - carb modified diet ordered   CKD 4 BUN 28, creatinine 1.29, GFR 45.  06/24/2024 creatinine 1.37.  Creatinine 05/12/2024 1.16.  Low suspicion of AKI, however given increased use of diuretics continue to monitor. - AM BMP ordered  Chronic Medical Conditions:   HTN BP on admission 140s systolic over 80s diastolic.  - Current management as listed above with HFrEF.   HLD Lipid panel 01/2024: Cholesterol 194, TG 66, HDL 59, LDL 122 - Atorvastatin  80 mg  Tobacco Use Disorder Current smoker. Cutting down on smoking (previously 3-5 cigarettes a day, now 1-2).  Multiple Lung Nodules incidentally noted on CT 04/2024 - CT chest ordered 06/05/2024  Best practice: Diet: Carb-Modified VTE: Eliquis  5 mg BID IVF: None,None Code: Full  Disposition planning: Prior to Admission Living Arrangement: Home, living alone, with familial and friend support Anticipated Discharge Location: Home  Dispo: Admit patient to Observation with expected length of stay less than 2 midnights.  Signed: Benuel Braun, DO Internal Medicine Resident  09/11/2024, 9:58 PM  On Call pager: [phone]

## 2024-09-12 DIAGNOSIS — J9601 Acute respiratory failure with hypoxia: Secondary | ICD-10-CM | POA: Diagnosis not present

## 2024-09-12 DIAGNOSIS — Z8249 Family history of ischemic heart disease and other diseases of the circulatory system: Secondary | ICD-10-CM | POA: Diagnosis not present

## 2024-09-12 DIAGNOSIS — M19131 Post-traumatic osteoarthritis, right wrist: Secondary | ICD-10-CM

## 2024-09-12 DIAGNOSIS — J9621 Acute and chronic respiratory failure with hypoxia: Secondary | ICD-10-CM | POA: Diagnosis present

## 2024-09-12 DIAGNOSIS — I130 Hypertensive heart and chronic kidney disease with heart failure and stage 1 through stage 4 chronic kidney disease, or unspecified chronic kidney disease: Secondary | ICD-10-CM | POA: Diagnosis present

## 2024-09-12 DIAGNOSIS — E66811 Obesity, class 1: Secondary | ICD-10-CM | POA: Diagnosis present

## 2024-09-12 DIAGNOSIS — J449 Chronic obstructive pulmonary disease, unspecified: Secondary | ICD-10-CM | POA: Diagnosis present

## 2024-09-12 DIAGNOSIS — I472 Ventricular tachycardia, unspecified: Secondary | ICD-10-CM | POA: Diagnosis present

## 2024-09-12 DIAGNOSIS — Z9981 Dependence on supplemental oxygen: Secondary | ICD-10-CM | POA: Diagnosis not present

## 2024-09-12 DIAGNOSIS — Z79899 Other long term (current) drug therapy: Secondary | ICD-10-CM

## 2024-09-12 DIAGNOSIS — E1122 Type 2 diabetes mellitus with diabetic chronic kidney disease: Secondary | ICD-10-CM

## 2024-09-12 DIAGNOSIS — J4489 Other specified chronic obstructive pulmonary disease: Secondary | ICD-10-CM | POA: Diagnosis present

## 2024-09-12 DIAGNOSIS — Z95810 Presence of automatic (implantable) cardiac defibrillator: Secondary | ICD-10-CM | POA: Diagnosis not present

## 2024-09-12 DIAGNOSIS — N184 Chronic kidney disease, stage 4 (severe): Secondary | ICD-10-CM | POA: Diagnosis present

## 2024-09-12 DIAGNOSIS — Z7901 Long term (current) use of anticoagulants: Secondary | ICD-10-CM

## 2024-09-12 DIAGNOSIS — E785 Hyperlipidemia, unspecified: Secondary | ICD-10-CM | POA: Diagnosis present

## 2024-09-12 DIAGNOSIS — L409 Psoriasis, unspecified: Secondary | ICD-10-CM

## 2024-09-12 DIAGNOSIS — Z90710 Acquired absence of both cervix and uterus: Secondary | ICD-10-CM | POA: Diagnosis not present

## 2024-09-12 DIAGNOSIS — Z833 Family history of diabetes mellitus: Secondary | ICD-10-CM | POA: Diagnosis not present

## 2024-09-12 DIAGNOSIS — L309 Dermatitis, unspecified: Secondary | ICD-10-CM | POA: Diagnosis present

## 2024-09-12 DIAGNOSIS — I5042 Chronic combined systolic (congestive) and diastolic (congestive) heart failure: Secondary | ICD-10-CM | POA: Diagnosis not present

## 2024-09-12 DIAGNOSIS — F17210 Nicotine dependence, cigarettes, uncomplicated: Secondary | ICD-10-CM | POA: Diagnosis not present

## 2024-09-12 DIAGNOSIS — I5023 Acute on chronic systolic (congestive) heart failure: Secondary | ICD-10-CM | POA: Diagnosis present

## 2024-09-12 DIAGNOSIS — Z794 Long term (current) use of insulin: Secondary | ICD-10-CM | POA: Diagnosis not present

## 2024-09-12 DIAGNOSIS — M19132 Post-traumatic osteoarthritis, left wrist: Secondary | ICD-10-CM

## 2024-09-12 DIAGNOSIS — Z7951 Long term (current) use of inhaled steroids: Secondary | ICD-10-CM | POA: Diagnosis not present

## 2024-09-12 DIAGNOSIS — T502X5A Adverse effect of carbonic-anhydrase inhibitors, benzothiadiazides and other diuretics, initial encounter: Secondary | ICD-10-CM | POA: Diagnosis present

## 2024-09-12 DIAGNOSIS — M25531 Pain in right wrist: Secondary | ICD-10-CM | POA: Diagnosis not present

## 2024-09-12 DIAGNOSIS — Z7985 Long-term (current) use of injectable non-insulin antidiabetic drugs: Secondary | ICD-10-CM | POA: Diagnosis not present

## 2024-09-12 DIAGNOSIS — M25532 Pain in left wrist: Secondary | ICD-10-CM | POA: Diagnosis not present

## 2024-09-12 DIAGNOSIS — I4892 Unspecified atrial flutter: Secondary | ICD-10-CM | POA: Diagnosis present

## 2024-09-12 DIAGNOSIS — Z823 Family history of stroke: Secondary | ICD-10-CM | POA: Diagnosis not present

## 2024-09-12 DIAGNOSIS — R911 Solitary pulmonary nodule: Secondary | ICD-10-CM

## 2024-09-12 LAB — CBC
HCT: 33.7 % — ABNORMAL LOW (ref 36.0–46.0)
Hemoglobin: 10.1 g/dL — ABNORMAL LOW (ref 12.0–15.0)
MCH: 25.5 pg — ABNORMAL LOW (ref 26.0–34.0)
MCHC: 30 g/dL (ref 30.0–36.0)
MCV: 85.1 fL (ref 80.0–100.0)
Platelets: 250 K/uL (ref 150–400)
RBC: 3.96 MIL/uL (ref 3.87–5.11)
RDW: 15.7 % — ABNORMAL HIGH (ref 11.5–15.5)
WBC: 8.9 K/uL (ref 4.0–10.5)
nRBC: 0 % (ref 0.0–0.2)

## 2024-09-12 LAB — CBG MONITORING, ED
Glucose-Capillary: 113 mg/dL — ABNORMAL HIGH (ref 70–99)
Glucose-Capillary: 114 mg/dL — ABNORMAL HIGH (ref 70–99)
Glucose-Capillary: 133 mg/dL — ABNORMAL HIGH (ref 70–99)

## 2024-09-12 LAB — BASIC METABOLIC PANEL WITH GFR
Anion gap: 9 (ref 5–15)
Anion gap: 13 (ref 5–15)
BUN: 29 mg/dL — ABNORMAL HIGH (ref 8–23)
BUN: 29 mg/dL — ABNORMAL HIGH (ref 8–23)
CO2: 29 mmol/L (ref 22–32)
CO2: 27 mmol/L (ref 22–32)
Calcium: 8.6 mg/dL — ABNORMAL LOW (ref 8.9–10.3)
Calcium: 8.6 mg/dL — ABNORMAL LOW (ref 8.9–10.3)
Chloride: 108 mmol/L (ref 98–111)
Chloride: 103 mmol/L (ref 98–111)
Creatinine, Ser: 1.28 mg/dL — ABNORMAL HIGH (ref 0.44–1.00)
Creatinine, Ser: 1.39 mg/dL — ABNORMAL HIGH (ref 0.44–1.00)
GFR, Estimated: 45 mL/min — ABNORMAL LOW (ref >60)
GFR, Estimated: 41 mL/min — ABNORMAL LOW (ref >60)
Glucose, Bld: 108 mg/dL — ABNORMAL HIGH (ref 70–99)
Glucose, Bld: 92 mg/dL (ref 70–99)
Potassium: 4 mmol/L (ref 3.5–5.1)
Potassium: 4.5 mmol/L (ref 3.5–5.1)
Sodium: 146 mmol/L — ABNORMAL HIGH (ref 135–145)
Sodium: 143 mmol/L (ref 135–145)

## 2024-09-12 LAB — RESP PANEL BY RT-PCR (RSV, FLU A&B, COVID)  RVPGX2
Influenza A by PCR: NEGATIVE
Influenza B by PCR: NEGATIVE
Resp Syncytial Virus by PCR: NEGATIVE
SARS Coronavirus 2 by RT PCR: NEGATIVE

## 2024-09-12 LAB — TROPONIN I (HIGH SENSITIVITY): Troponin I (High Sensitivity): 34 ng/L — ABNORMAL HIGH (ref <18)

## 2024-09-12 LAB — MAGNESIUM
Magnesium: 1.3 mg/dL — ABNORMAL LOW (ref 1.7–2.4)
Magnesium: 1.4 mg/dL — ABNORMAL LOW (ref 1.7–2.4)
Magnesium: 4.1 mg/dL — ABNORMAL HIGH (ref 1.7–2.4)
Magnesium: 2.8 mg/dL — ABNORMAL HIGH (ref 1.7–2.4)

## 2024-09-12 LAB — GLUCOSE, CAPILLARY
Glucose-Capillary: 123 mg/dL — ABNORMAL HIGH (ref 70–99)
Glucose-Capillary: 134 mg/dL — ABNORMAL HIGH (ref 70–99)

## 2024-09-12 MED ORDER — MAGNESIUM SULFATE 4 GM/100ML IV SOLN
4.0000 g | Freq: Once | INTRAVENOUS | Status: AC
  Administered 2024-09-12: 4 g via INTRAVENOUS
  Filled 2024-09-12: qty 100

## 2024-09-12 MED ORDER — DICLOFENAC SODIUM 1 % EX GEL
4.0000 g | Freq: Four times a day (QID) | CUTANEOUS | Status: DC
  Administered 2024-09-13 – 2024-09-14 (×5): 4 g via TOPICAL
  Filled 2024-09-12 (×2): qty 100

## 2024-09-12 MED ORDER — PREDNISONE 20 MG PO TABS
20.0000 mg | ORAL_TABLET | Freq: Every day | ORAL | Status: DC
  Administered 2024-09-13 – 2024-09-14 (×2): 20 mg via ORAL
  Filled 2024-09-12 (×2): qty 1

## 2024-09-12 NOTE — Progress Notes (Signed)
 PT Cancellation Note  Patient Details Name: [name] MRN: [id number] DOB: July 31, 1951   Cancelled Treatment:    Reason Eval/Treat Not Completed: Fatigue/lethargy limiting ability to participate;Other (comment) (Per RN pt is too lethargic and SOB to participate with therapy right now. Requests PT to try again tomorrow.)   Pamila Mendibles 09/12/2024, 11:15 AM

## 2024-09-12 NOTE — Evaluation (Signed)
 Occupational Therapy Evaluation Patient Details Name: [name] MRN: [id number] DOB: Apr 13, 1951 Today's Date: 09/12/2024   History of Present Illness   [name] is a 73 y.o. female who presented with 10 days of SOB at home. PMH significant for recent admit 8/5 for AKI/CKA; COPD Gold stage II with chronic hypoxic respite failure on nocturnal nasal cannula, chronic combined HFpEF and HFrEF, LVEF 20-25%, status post AICD, IIDM, cigarette smoker, HTN, HLD, lipoma, depression.     Clinical Impressions [name] was evaluated s/p the above admission list. She is mod I and lives alone at baseline. Upon evaluation the pt was limited by extreme SOB with any activity, bilateral hand and wrist pain, fatigue and weakness. Overall she needed min A for bed mobility, to stand and to side step. RR went to 42 SpO2 was >90% on -2L O2. Due to the deficits listed below the pt also needs up to total A for LB ADLs and mod A for UB ADLs. Pt will benefit from continued acute OT services and skilled inpatient follow up therapy, <3 hours/day unless she makes good acute progress      If plan is discharge home, recommend the following:   A little help with walking and/or transfers;A lot of help with bathing/dressing/bathroom;Assistance with cooking/housework;Assist for transportation;Help with stairs or ramp for entrance     Functional Status Assessment   Patient has had a recent decline in their functional status and demonstrates the ability to make significant improvements in function in a reasonable and predictable amount of time.     Equipment Recommendations   Other (comment) (pendign progress)      Precautions/Restrictions   Precautions Precautions: Fall Precaution/Restrictions Comments: watch O2 & HR Restrictions Weight Bearing Restrictions Per Provider Order: No     Mobility Bed Mobility Overal bed mobility: Needs Assistance Bed Mobility: Supine to Sit, Sit to Supine     Supine to  sit: Min assist Sit to supine: Mod assist        Transfers Overall transfer level: Needs assistance Equipment used: 1 person hand held assist Transfers: Sit to/from Stand Sit to Stand: Min assist           General transfer comment: side stepped along the EOB, only tolerated a few steps prior to needing to sit due to SOB      Balance Overall balance assessment: Needs assistance Sitting-balance support: No upper extremity supported Sitting balance-Leahy Scale: Fair     Standing balance support: Single extremity supported, During functional activity Standing balance-Leahy Scale: Poor                             ADL either performed or assessed with clinical judgement   ADL Overall ADL's : Needs assistance/impaired Eating/Feeding: Set up;Sitting   Grooming: Set up;Sitting   Upper Body Bathing: Moderate assistance   Lower Body Bathing: Total assistance   Upper Body Dressing : Moderate assistance   Lower Body Dressing: Total assistance   Toilet Transfer: Minimal assistance   Toileting- Clothing Manipulation and Hygiene: Total assistance;Bed level Toileting - Clothing Manipulation Details (indicate cue type and reason): incontinent upon arrival     Functional mobility during ADLs: Minimal assistance General ADL Comments: significantly limited by activity tolerance and SOB     Vision Baseline Vision/History: 1 Wears glasses Vision Assessment?: No apparent visual deficits     Perception Perception: Not tested       Praxis Praxis: Not tested  Pertinent Vitals/Pain Pain Assessment Pain Assessment: Faces Faces Pain Scale: Hurts even more Pain Location: R hand/wrist Pain Descriptors / Indicators: Discomfort, Grimacing, Guarding Pain Intervention(s): Limited activity within patient's tolerance, Monitored during session     Extremity/Trunk Assessment Upper Extremity Assessment Upper Extremity Assessment: LUE deficits/detail;RUE  deficits/detail RUE Deficits / Details: distal edema, painful to touch and with movement. shoulder and elbow are [facility]. LUE Deficits / Details: edema distally, pt reports some mild pain but she is abel to use functionally LUE Sensation: WNL LUE Coordination: decreased fine motor   Lower Extremity Assessment Lower Extremity Assessment: Defer to PT evaluation   Cervical / Trunk Assessment Cervical / Trunk Assessment: Other exceptions Cervical / Trunk Exceptions: body habitus   Communication Communication Communication: No apparent difficulties   Cognition Arousal: Alert Behavior During Therapy: WFL for tasks assessed/performed Cognition: No apparent impairments             OT - Cognition Comments: seemingly WFL                 Following commands: Intact       Cueing  General Comments   Cueing Techniques: Verbal cues  VSS on .5-2L           Home Living Family/patient expects to be discharged to:: Private residence Living Arrangements: Alone Available Help at Discharge: Family;Available PRN/intermittently;Neighbor Type of Home: Apartment Home Access: Stairs to enter [employer] of Steps: 4 Entrance Stairs-Rails: Right Home Layout: One level     Bathroom Shower/Tub: Chief Strategy Officer: Standard     Home Equipment: Cane - single point;Toilet riser;Tub bench;Wheelchair - Medical Laboratory Scientific Officer Comments: 2-3L O2 at night      Prior Functioning/Environment Prior Level of Function : Independent/Modified Independent             Mobility Comments: using a rollator ADLs Comments: states mod I - wears briefs but tries to make it to the toilet for each toileting need    OT Problem List: Decreased strength;Decreased activity tolerance;Decreased range of motion;Impaired balance (sitting and/or standing);Decreased safety awareness;Decreased knowledge of use of DME or AE;Decreased knowledge of  precautions;Cardiopulmonary status limiting activity   OT Treatment/Interventions: Self-care/ADL training;Therapeutic exercise;Energy conservation;DME and/or AE instruction;Therapeutic activities;Patient/family education;Balance training      OT Goals(Current goals can be found in the care plan section)   Acute Rehab OT Goals Patient Stated Goal: home OT Goal Formulation: With patient Time For Goal Achievement: 09/26/24 Potential to Achieve Goals: Good ADL Goals Pt Will Perform Grooming: with modified independence Pt Will Perform Upper Body Dressing: with modified independence Pt Will Perform Lower Body Dressing: with modified independence;sit to/from stand Pt Will Transfer to Toilet: with modified independence;ambulating Additional ADL Goal #1: pt will indep recall at least 3 energy conservation strategies   OT Frequency:  Min 2X/week       AM-PAC OT 6 Clicks Daily Activity     Outcome Measure Help from another person eating meals?: A Little Help from another person taking care of personal grooming?: A Little Help from another person toileting, which includes using toliet, bedpan, or urinal?: A Lot Help from another person bathing (including washing, rinsing, drying)?: A Lot Help from another person to put on and taking off regular upper body clothing?: A Little Help from another person to put on and taking off regular lower body clothing?: A Lot 6 Click Score: 15   End of Session Equipment Utilized During Treatment: Oxygen  Nurse Communication: Mobility status  Activity Tolerance: Patient limited by fatigue Patient left: in bed;with call bell/phone within reach  OT Visit Diagnosis: Unsteadiness on feet (R26.81);Other abnormalities of gait and mobility (R26.89);Muscle weakness (generalized) (M62.81)                Time: 9084-9064 OT Time Calculation (min): 20 min Charges:  OT General Charges $OT Visit: 1 Visit OT Evaluation $OT Eval Moderate Complexity: 1  Mod  Lucie Kendall, OTR/L Acute Rehabilitation Services Office [phone] Secure Chat Communication Preferred   Lucie JONETTA Kendall 09/12/2024, 11:23 AM

## 2024-09-12 NOTE — ED Notes (Signed)
 This nurse noted that patient had a run of v-tach for about 4 beats. This nurse attempted to collect an EKG without success. Patient was calm and comfortable with no new distress or chest pain. Later, this nurse noted that patient's heart rate increased to the 120s and again attempted to capture an EKG without success. This nurse alerted Sallyanne Matin, DO regarding the update. DO stated to alert team if a run of v-tach is sustained for 6 beats.

## 2024-09-12 NOTE — Progress Notes (Signed)
 Responded to [place] consult to support pt.  Pt. Expressed she was having lots of stress and anxiety over family matter and requested prayer.  I prayed for her and provided listening,spiritual and emotional support.. Chaplain available as needed.  Rayleen Levander Kerry, [facility], Pager [phone]

## 2024-09-12 NOTE — Progress Notes (Signed)
 Heart Failure Navigator Progress Note  Assessed for Heart & Vascular TOC clinic readiness.  Patient does not meet criteria due to she is a Dr. Cherrie patient with the Advanced Heart Failure Team. .   Navigator will sign off at this time.   Stephane Haddock, BSN, Scientist, Clinical (histocompatibility And Immunogenetics) Only

## 2024-09-12 NOTE — Progress Notes (Cosign Needed)
 HD#0 SUBJECTIVE:  Patient Summary: [name] is a 73 y.o. person living with a history of PMH of HFrEF (EF 20-25%) s/p ICD, COPD, CKD stage IV, T2DM, HTN, paroxysmal a flutter, psoriasis, tobacco use disorder who presented with SOB and admitted for acute hypoxic respiratory failure likely secondary to HF exacerbation  Overnight Events: No overnight events  Interim History: She feel pretty good today,. Her concern is her  breathing, however, feels breathing is a little better than when she came into the hospital.Does not get worse by laying down. She did mention her emotional stress regarding her grand daughter heart attack. She feels very frustrated being in and out of the hospital and in rehab as well.. Normally she does not have cough but started 3  days ago which is mix dry and productive. She uses Oxygen  at home 2-3 lit during the day and also oxygen  at night. Did reassure that we want her to feel better emotionally and physically to the best of her ability.   OBJECTIVE:  Vital Signs: Vitals:   09/12/24 0500 09/12/24 0552 09/12/24 1000 09/12/24 1341  BP: 125/75  136/89 126/85  Pulse: 81  84 90  Resp: (!) 23  (!) 25 20  Temp:  97.7 F (36.5 C) (!) 97.5 F (36.4 C) 97.6 F (36.4 C)  TempSrc:  Oral Oral Oral  SpO2: 100%  91% 91%  Weight:    75.6 kg  Height:    5' (1.524 m)   Supplemental O2: Nasal Cannula SpO2: 91 % O2 Flow Rate (L/min): 2 L/min  Filed Weights   09/12/24 1341  Weight: 75.6 kg    No intake or output data in the 24 hours ending 09/12/24 1447 Net IO Since Admission: No IO data has been entered for this period [09/12/24 1447]  Physical Exam: Physical Exam Cardiovascular:     Rate and Rhythm: Normal rate and regular rhythm.     Comments: Hard to auscultate as breath sounds are more prominent but no murmur could be appreciated at this time.  Palpable DP pulses bilaterally.  Pulmonary:     Effort: No accessory muscle usage or respiratory distress.      Comments: Some increased work of breathing but not accessory muscle use.   Rales bilaterally in all lung fields but best heard posteriorly. Musculoskeletal:     Right lower leg: Edema present.     Left lower leg: Edema present.     Comments: Does not appear to be pitting edema   Tenderness to the MCP joints bilaterally.  Right wrist has tenderness to touch with limitation in extension of wrist. Radial pulse intact. Edema noted around the wrist on extensor surface.   Skin:    Comments: Scaly patches noted over extensor surface of left arm.  Rough dry skin appreciated on lower extremities.      Patient Lines/Drains/Airways Status     Active Line/Drains/Airways     Name Placement date Placement time Site Days   Peripheral IV 09/11/24 22 G 1.75 Anterior;Left Forearm 09/11/24  2120  Forearm  1            Pertinent labs and imaging:      Latest Ref Rng & Units 09/12/2024    3:08 AM 09/11/2024    5:45 PM 06/24/2024    6:50 PM  CBC  WBC 4.0 - 10.5 K/uL 8.9  9.0  12.3   Hemoglobin 12.0 - 15.0 g/dL 89.8  87.7  88.8   Hematocrit 36.0 -  46.0 % 33.7  41.1  37.1   Platelets 150 - 400 K/uL 250  232  309        Latest Ref Rng & Units 09/12/2024    3:08 AM 09/11/2024    5:45 PM 06/24/2024    6:50 PM  CMP  Glucose 70 - 99 mg/dL 891  872  859   BUN 8 - 23 mg/dL 29  28  15    Creatinine 0.44 - 1.00 mg/dL 8.71  8.70  8.62   Sodium 135 - 145 mmol/L 146  143  145   Potassium 3.5 - 5.1 mmol/L 4.0  4.0  3.7   Chloride 98 - 111 mmol/L 108  103  109   CO2 22 - 32 mmol/L 29  27  20    Calcium  8.9 - 10.3 mg/dL 8.6  9.1  9.1   Total Protein 6.5 - 8.1 g/dL   7.5   Total Bilirubin 0.0 - 1.2 mg/dL   1.0   Alkaline Phos 38 - 126 U/L   134   AST 15 - 41 U/L   21   ALT 0 - 44 U/L   13     DG Wrist 2 Views Left Result Date: 09/11/2024 CLINICAL DATA:  Wrist pain EXAM: LEFT WRIST - 2 VIEW; RIGHT WRIST - 2 VIEW COMPARISON:  11/03/2013 FINDINGS: Left wrist: Frontal and lateral views are  obtained. No acute fracture, subluxation, or dislocation. Mild osteoarthritis greatest in the radial aspect of the carpus. Soft tissues are unremarkable. Right wrist: Frontal and lateral views are obtained. No acute fracture, subluxation, or dislocation. Mild osteoarthritis greatest in the radial aspect of the carpus. Soft tissues are unremarkable. IMPRESSION: 1. Mild osteoarthritis within the bilateral wrists. No acute bony abnormalities. Electronically Signed   By: Ozell Daring M.D.   On: 09/11/2024 21:43   DG Wrist 2 Views Right Result Date: 09/11/2024 CLINICAL DATA:  Wrist pain EXAM: LEFT WRIST - 2 VIEW; RIGHT WRIST - 2 VIEW COMPARISON:  11/03/2013 FINDINGS: Left wrist: Frontal and lateral views are obtained. No acute fracture, subluxation, or dislocation. Mild osteoarthritis greatest in the radial aspect of the carpus. Soft tissues are unremarkable. Right wrist: Frontal and lateral views are obtained. No acute fracture, subluxation, or dislocation. Mild osteoarthritis greatest in the radial aspect of the carpus. Soft tissues are unremarkable. IMPRESSION: 1. Mild osteoarthritis within the bilateral wrists. No acute bony abnormalities. Electronically Signed   By: Ozell Daring M.D.   On: 09/11/2024 21:43   DG Chest 1 View Result Date: 09/11/2024 CLINICAL DATA:  Shortness of breath for 10 days, hypoxia EXAM: CHEST  1 VIEW COMPARISON:  06/24/2024 FINDINGS: Single frontal view of the chest demonstrates stable single lead pacer/AICD. Cardiac silhouette is enlarged. Lung volumes are diminished, with pulmonary vascular congestion and bilateral interstitial and ground-glass opacities consistent with edema. There are bilateral pleural effusions, left greater than right, with likely underlying lower lobe atelectasis. No pneumothorax. IMPRESSION: 1. Constellation of findings most consistent with congestive heart failure and pulmonary edema. Electronically Signed   By: Ozell Daring M.D.   On: 09/11/2024 18:11     ASSESSMENT/PLAN:  Assessment: Principal Problem:   Acute hypoxic respiratory failure (HCC) Active Problems:   Essential hypertension   Chronic combined systolic and diastolic heart failure (HCC)   ICD (implantable cardioverter-defibrillator) in place   DM (diabetes mellitus) type II controlled with renal manifestation (HCC)   Hyperlipidemia   CKD (chronic kidney disease) stage 4, GFR 15-29 ml/min (HCC)   COPD (  chronic obstructive pulmonary disease) (HCC)  NEL STONEKING is a 73 y.o. person living with a history of PMH of HFrEF (EF 20-25%) s/p ICD, COPD, CKD stage IV, T2DM, HTN, paroxysmal a flutter, psoriasis, tobacco use disorder who presented with SOB and admitted for acute hypoxic respiratory failure likely secondary to HF exacerbation   Plan: #Acute Hypoxic Respiratory Failure HFrEF (chronic combined systolic and diastolic HF s/p ICD) COPD Patient is on 2L of oxygen  which she states she has been using at home. She is saturating well but does appear to have slightly increased respiratory effort. Do believe after talking to patient that although she had new cough, she had minimal sputum production. Cough could be more likely related to volume overload as well.  - Given lasix  80 mg x 2  - continue to monitor Is &Os and and get daily weights  - Continue Anoro Ellipta  inhaler, daily DuoNebs every 6 hours, continue albuterol  PRN ordered - guaifenesin  600 mg BID ordered  - monitor electrolytes: Goal K>4, Mg >2  - GDMT previously was bisoprolol  2.5 mg daily, spironolactone  12.5 mg daily, torsemide  20 mg.  Could consider adding Farxiga  before discharge.  Continue bisoprolol  and spironolactone  while inpatient. - I dont  believe patient is having COPD exacerbation, but could benefit from steroids to help reduce inflammation. Start prednisone  20 mg   Bilateral Wrist Pain  Psoriasis without hx of psoriatic arthritis  Did not believe given bilateral pain that this is septic arthritis. No hx  of gout or psoriatic arthritis.  Would be inflammatory due to osteoarthritis that has been noted on bilateral lower x-rays. - Will add prednisone  20 mg for COPD, and this should cover and reduce some inflammation.  - will add voltaren  gel to see if this will help  - Tylenol  650 mg every 6 hours as needed  Paroxysmal atrial flutter Appears to be in sinus rhythm at this time.  Did have episodes of nonsustained V. tach. -Continue to monitor on cardiac telemetry - Apixaban  5 mg twice daily -Continue bisoprolol  2.5 mg daily   Recent stasis Could be likely mixed due to shortness of breath and due to family stressors in her life.  She has been relying on a cane and walker at x 2.  Patient was agreeable to speaking to chaplain for spiritual guidance - Chaplain consult.  Appreciate their help.  #T2DM Blood sugars have been in range while here in the hospital.  - Last A1c was 8.6 - Glargine 5 units daily - Sliding scale insulin  ordered - Carb modified diet  #CKD 4 Need to be watched in the setting of diuresis. -Continue to monitor with BMP - Cr 1.39, seems to be at baseline  Chronic Conditions:  HTN BP has been more normotensive today.  - Management as per HFrEF  HLD Lipid panel 01/2024: Cholesterol 194, TG 66, HDL 59, LDL 122 - Atorvastatin  80 mg   Tobacco Use Disorder Current smoker. Cutting down on smoking (previously 3-5 cigarettes a day, now 1-2).   Multiple Lung Nodules incidentally noted on CT 04/2024 - CT chest ordered 06/05/2024  Best Practice: Diet: Diabetic diet IVF: Fluids: none VTE:  Code: Full  Disposition planning: Therapy Recs: pending Family Contact: Patient does not want family to be notified  DISPO: Anticipated discharge pending further management   Signature:  Andrue Dini D'Mello [name]  2:47 PM, 09/12/2024  On Call pager [phone]

## 2024-09-13 DIAGNOSIS — M13131 Monoarthritis, not elsewhere classified, right wrist: Secondary | ICD-10-CM | POA: Diagnosis present

## 2024-09-13 LAB — BASIC METABOLIC PANEL WITH GFR
Anion gap: 10 (ref 5–15)
BUN: 29 mg/dL — ABNORMAL HIGH (ref 8–23)
CO2: 27 mmol/L (ref 22–32)
Calcium: 8.5 mg/dL — ABNORMAL LOW (ref 8.9–10.3)
Chloride: 103 mmol/L (ref 98–111)
Creatinine, Ser: 1.48 mg/dL — ABNORMAL HIGH (ref 0.44–1.00)
GFR, Estimated: 38 mL/min — ABNORMAL LOW (ref >60)
Glucose, Bld: 121 mg/dL — ABNORMAL HIGH (ref 70–99)
Potassium: 4.4 mmol/L (ref 3.5–5.1)
Sodium: 140 mmol/L (ref 135–145)

## 2024-09-13 LAB — GLUCOSE, CAPILLARY
Glucose-Capillary: 144 mg/dL — ABNORMAL HIGH (ref 70–99)
Glucose-Capillary: 262 mg/dL — ABNORMAL HIGH (ref 70–99)
Glucose-Capillary: 165 mg/dL — ABNORMAL HIGH (ref 70–99)
Glucose-Capillary: 159 mg/dL — ABNORMAL HIGH (ref 70–99)

## 2024-09-13 LAB — MAGNESIUM: Magnesium: 2.4 mg/dL (ref 1.7–2.4)

## 2024-09-13 MED ORDER — HYDROCERIN EX CREA
TOPICAL_CREAM | Freq: Two times a day (BID) | CUTANEOUS | Status: DC
  Filled 2024-09-13: qty 113

## 2024-09-13 MED ORDER — IPRATROPIUM-ALBUTEROL 0.5-2.5 (3) MG/3ML IN SOLN
3.0000 mL | Freq: Two times a day (BID) | RESPIRATORY_TRACT | Status: DC
  Administered 2024-09-13 – 2024-09-14 (×2): 3 mL via RESPIRATORY_TRACT
  Filled 2024-09-13 (×2): qty 3

## 2024-09-13 MED ORDER — TORSEMIDE 20 MG PO TABS
20.0000 mg | ORAL_TABLET | Freq: Every day | ORAL | Status: DC
  Administered 2024-09-13 – 2024-09-14 (×2): 20 mg via ORAL
  Filled 2024-09-13 (×2): qty 1

## 2024-09-13 NOTE — TOC Progression Note (Signed)
 Transition of Care [facility]) - Progression Note    Patient Details  Name: [name] MRN: [id number] Date of Birth: 1951-09-23  Transition of Care [facility] CM/SW Contact  Waddell Barnie Rama, RN Phone Number: 09/13/2024, 1:47 PM  Clinical Narrative:    NCM notified patient does not want SNF, she wants to go home with [facility] but they can not start with therapy for 2-3 weeks in her area, NCM sent referral out to other agencies thru portal.     Expected Discharge Plan: Home w Home Health Services Barriers to Discharge: Continued Medical Work up               Expected Discharge Plan and Services   Discharge Planning Services: CM Consult Post Acute Care Choice: Home Health Living arrangements for the past 2 months: Single Family Home                                       Social Drivers of Health (SDOH) Interventions SDOH Screenings   Food Insecurity: No Food Insecurity (09/12/2024)  Housing: Low Risk (09/12/2024)  Transportation Needs: No Transportation Needs (09/12/2024)  Utilities: Not At Risk (09/12/2024)  Alcohol Screen: Low Risk (01/09/2024)  Depression (PHQ2-9): Low Risk (06/19/2024)  Financial Resource Strain: Low Risk (01/09/2024)  Physical Activity: Inactive (01/09/2024)  Social Connections: Moderately Integrated (09/12/2024)  Stress: No Stress Concern Present (01/09/2024)  Tobacco Use: High Risk (09/11/2024)  Health Literacy: Adequate Health Literacy (01/09/2024)    Readmission Risk Interventions    06/22/2022   11:43 AM  Readmission Risk Prevention Plan  Transportation Screening Complete  PCP or Specialist Appt within 5-7 Days Complete  Home Care Screening Complete  Medication Review (RN CM) Referral to Pharmacy

## 2024-09-13 NOTE — Progress Notes (Signed)
°  Progress Note   Date: 09/13/2024  Patient Name: [name]        MRN#: [id number]  Review the patients clinical findings supports the diagnosis of:   Obesity class I

## 2024-09-13 NOTE — TOC Initial Note (Signed)
 Transition of Care [facility]) - Initial/Assessment Note    Patient Details  Name: [name] MRN: [id number] Date of Birth: 1951/08/05  Transition of Care [facility]) CM/SW Contact:    Luise JAYSON Pan, LCSWA Phone Number: 09/13/2024, 1:41 PM  Clinical Narrative:    CSW followed up with patient about PT rec for SNF. Patient declined SNF at this time and stated Im not going back to one, Im going home. CSW notified RNCM.  CSW will continue to follow.               Expected Discharge Plan: Home w Home Health Services Barriers to Discharge: Continued Medical Work up   Patient Goals and CMS Choice Patient states their goals for this hospitalization and ongoing recovery are:: To return home          Expected Discharge Plan and Services   Discharge Planning Services: CM Consult Post Acute Care Choice: Home Health Living arrangements for the past 2 months: Single Family Home                                      Prior Living Arrangements/Services Living arrangements for the past 2 months: Single Family Home Lives with:: Self Patient language and need for interpreter reviewed:: Yes Do you feel safe going back to the place where you live?: Yes      Need for Family Participation in Patient Care: No (Comment) Care giver support system in place?: No (comment)   Criminal Activity/Legal Involvement Pertinent to Current Situation/Hospitalization: No - Comment as needed  Activities of Daily Living   ADL Screening (condition at time of admission) Independently performs ADLs?: Yes (appropriate for developmental age) Is the patient deaf or have difficulty hearing?: No Does the patient have difficulty seeing, even when wearing glasses/contacts?: No Does the patient have difficulty concentrating, remembering, or making decisions?: No  Permission Sought/Granted Permission sought to share information with : Facility Medical Sales Representative, Family Supports Permission granted to share  information with : Yes, Verbal Permission Granted  Share Information with NAME: Jeter,[name]  Permission granted to share info w AGENCY: HH  Permission granted to share info w Relationship: Daughter  Permission granted to share info w Contact Information: [phone]  Emotional Assessment Appearance:: Appears stated age Attitude/Demeanor/Rapport: Engaged Affect (typically observed): Pleasant Orientation: : Oriented to Situation, Oriented to  Time, Oriented to Place, Oriented to Self Alcohol / Substance Use: Not Applicable Psych Involvement: No (comment)  Admission diagnosis:  Chronic obstructive pulmonary disease, unspecified COPD type (HCC) [J44.9] Acute on chronic congestive heart failure, unspecified heart failure type (HCC) [I50.9] Acute hypoxic respiratory failure (HCC) [J96.01] Patient Active Problem List   Diagnosis Date Noted   Acute hypoxic respiratory failure (HCC) 09/11/2024   Acute cystitis with hematuria 05/09/2024   Leukocytosis 05/08/2024   Acute on chronic HFrEF (heart failure with reduced ejection fraction) (HCC) 05/07/2024   Pain and swelling of ankle 05/06/2024   Multiple lung nodules 04/23/2024   QT prolongation 04/15/2024   Acute on chronic hypoxic respiratory failure (HCC) 04/13/2024   Right ankle swelling 03/26/2024   Small vessel disease, cerebrovascular 12/28/2023   Esophageal dysphagia 10/10/2022   Preventative health care 08/18/2022   CKD (chronic kidney disease) stage 4, GFR 15-29 ml/min (HCC) 07/26/2022   COPD (chronic obstructive pulmonary disease) (HCC) 07/26/2022   Obesity (BMI 30-39.9) 07/26/2022   Paroxysmal atrial flutter (HCC) 07/25/2022   Multiple thyroid  nodules 07/04/2022  Recurrent falls 07/04/2022   Hepatic steatosis 07/28/2020   Hyperlipidemia    Osteoporosis 02/12/2020   Intrinsic atopic dermatitis 09/19/2019   Hyperkalemia 08/08/2017   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   Acute on chronic systolic heart failure (HCC)  93/83/7982   AKI (acute kidney injury)    Spinal stenosis of lumbar region 12/09/2015   Restrictive lung disease 06/24/2015   Diabetic gastroparesis associated with type 2 diabetes mellitus (HCC) 07/12/2014   Depression 05/22/2014   DM (diabetes mellitus) type II controlled with renal manifestation (HCC) 04/03/2012   Tobacco use disorder 10/26/2009   Essential hypertension 05/25/2007   Chronic combined systolic and diastolic heart failure (HCC) 05/25/2007   ICD (implantable cardioverter-defibrillator) in place 05/04/2007   PCP:  [name], MD Pharmacy:   [facility] [phone] - [place] - [address] AT [facility] [address] [place] [address] Phone: [phone] Fax: [phone]     Social Drivers of Health (SDOH) Social History: SDOH Screenings   Food Insecurity: No Food Insecurity (09/12/2024)  Housing: Low Risk (09/12/2024)  Transportation Needs: No Transportation Needs (09/12/2024)  Utilities: Not At Risk (09/12/2024)  Alcohol Screen: Low Risk (01/09/2024)  Depression (PHQ2-9): Low Risk (06/19/2024)  Financial Resource Strain: Low Risk (01/09/2024)  Physical Activity: Inactive (01/09/2024)  Social Connections: Moderately Integrated (09/12/2024)  Stress: No Stress Concern Present (01/09/2024)  Tobacco Use: High Risk (09/11/2024)  Health Literacy: Adequate Health Literacy (01/09/2024)   SDOH Interventions:     Readmission Risk Interventions    06/22/2022   11:43 AM  Readmission Risk Prevention Plan  Transportation Screening Complete  PCP or Specialist Appt within 5-7 Days Complete  Home Care Screening Complete  Medication Review (RN CM) Referral to Pharmacy

## 2024-09-13 NOTE — Evaluation (Signed)
 Physical Therapy Evaluation Patient Details Name: [name] MRN: [id number] DOB: 19-Apr-1951 Today's Date: 09/13/2024  History of Present Illness  [name] is a 73 y.o. female who presented with 10 days of SOB at home. Admitted with acute hypoxic respiratory failure likely 2/2 HF exacerbation. PMH significant for recent admit 8/5 for AKI/CKA; COPD Gold stage II with chronic hypoxic respite failure on nocturnal nasal cannula, chronic combined HFpEF and HFrEF, LVEF 20-25%, status post AICD, IIDM, cigarette smoker, HTN, HLD, lipoma, depression.   Clinical Impression  PTA pt would ambulate with no AD in the home with use of rollator in the community. Pt required CGA for bed mobility and supervision to stand with no AD. Pt was able to step-pivot to the recliner with CGA for safety. Pt declined further mobility as lunch had just been delivered. Pt currently lives alone with intermittent assist available. Discussed post-acute PT with current recommendation for <3hrs post acute rehab. Pt would prefer to go home, however, pt would need to be at ModI level for short distance ambulation with ability to negotiate 4 steps into the home. Acute PT to follow to address functional limitations.         If plan is discharge home, recommend the following: A lot of help with walking and/or transfers;A lot of help with bathing/dressing/bathroom;Assist for transportation;Help with stairs or ramp for entrance;Assistance with cooking/housework   Can travel by private vehicle   Yes    Equipment Recommendations None recommended by PT     Functional Status Assessment Patient has had a recent decline in their functional status and demonstrates the ability to make significant improvements in function in a reasonable and predictable amount of time.     Precautions / Restrictions Precautions Precautions: Fall Recall of Precautions/Restrictions: Intact Precaution/Restrictions Comments: watch O2 &  HR Restrictions Weight Bearing Restrictions Per Provider Order: No      Mobility  Bed Mobility Overal bed mobility: Needs Assistance Bed Mobility: Supine to Sit    Supine to sit: Contact guard    General bed mobility comments: CGA for safety, no physical assist needed    Transfers Overall transfer level: Needs assistance Equipment used: None Transfers: Sit to/from Stand, Bed to chair/wheelchair/BSC Sit to Stand: Supervision   Step pivot transfers: Contact guard assist   General transfer comment: supervision to stand with no AD with CGA to perform step-pivot.    Ambulation/Gait Ambulation/Gait assistance: Contact guard assist Gait Distance (Feet): 4 Feet Assistive device: None Gait Pattern/deviations: Step-through pattern, Decreased stride length, Shuffle Gait velocity: decr     General Gait Details: Slow shuffling steps towards recliner. Pt declining further distance as lunch was just brought in    Balance Overall balance assessment: Needs assistance Sitting-balance support: No upper extremity supported Sitting balance-Leahy Scale: Fair    Standing balance support: During functional activity, No upper extremity supported Standing balance-Leahy Scale: Fair       Pertinent Vitals/Pain Pain Assessment Pain Assessment: Faces Faces Pain Scale: Hurts a little bit Pain Location: R hand/wrist Pain Descriptors / Indicators: Discomfort, Grimacing, Guarding Pain Intervention(s): Limited activity within patient's tolerance, Monitored during session, Repositioned    Home Living Family/patient expects to be discharged to:: Private residence Living Arrangements: Alone Available Help at Discharge: Family;Available PRN/intermittently;Neighbor Type of Home: Apartment Home Access: Stairs to enter Entrance Stairs-Rails: Right Entrance Stairs-Number of Steps: 4   Home Layout: One level Home Equipment: Cane - single point;Toilet riser;Tub bench;Wheelchair - power;Standard  Walker;BSC/3in1 Additional Comments: 2-3L O2 during day  and night    Prior Function Prior Level of Function : Independent/Modified Independent    Mobility Comments: ModI with no AD in the home, rollator for community mobility. Denies falls ADLs Comments: states mod I - wears briefs but tries to make it to the toilet for each toileting need     Extremity/Trunk Assessment   Upper Extremity Assessment Upper Extremity Assessment: Defer to OT evaluation    Lower Extremity Assessment Lower Extremity Assessment: Generalized weakness    Cervical / Trunk Assessment Cervical / Trunk Assessment: Other exceptions Cervical / Trunk Exceptions: body habitus  Communication   Communication Communication: No apparent difficulties    Cognition Arousal: Alert Behavior During Therapy: WFL for tasks assessed/performed   PT - Cognitive impairments: No apparent impairments    Following commands: Intact       Cueing Cueing Techniques: Verbal cues     General Comments General comments (skin integrity, edema, etc.): VSS on 2L     PT Assessment Patient needs continued PT services  PT Problem List Decreased strength;Decreased activity tolerance;Decreased balance;Decreased mobility;Cardiopulmonary status limiting activity       PT Treatment Interventions DME instruction;Gait training;Stair training;Functional mobility training;Therapeutic activities;Therapeutic exercise;Balance training;Neuromuscular re-education;Patient/family education    PT Goals (Current goals can be found in the Care Plan section)  Acute Rehab PT Goals Patient Stated Goal: to be able to go home PT Goal Formulation: With patient Time For Goal Achievement: 09/27/24 Potential to Achieve Goals: Good    Frequency Min 2X/week        AM-PAC PT 6 Clicks Mobility  Outcome Measure Help needed turning from your back to your side while in a flat bed without using bedrails?: A Little Help needed moving from lying on your  back to sitting on the side of a flat bed without using bedrails?: A Little Help needed moving to and from a bed to a chair (including a wheelchair)?: A Little Help needed standing up from a chair using your arms (e.g., wheelchair or bedside chair)?: A Little Help needed to walk in hospital room?: A Lot Help needed climbing 3-5 steps with a railing? : A Lot 6 Click Score: 16    End of Session Equipment Utilized During Treatment: Oxygen  Activity Tolerance: Patient tolerated treatment well Patient left: in chair;with call bell/phone within reach;with chair alarm set Nurse Communication: Mobility status PT Visit Diagnosis: Unsteadiness on feet (R26.81);Other abnormalities of gait and mobility (R26.89);Muscle weakness (generalized) (M62.81)    Time: 8799-8781 PT Time Calculation (min) (ACUTE ONLY): 18 min   Charges:   PT Evaluation $PT Eval Low Complexity: 1 Low   PT General Charges $$ ACUTE PT VISIT: 1 Visit       Kate ORN, PT, DPT Secure Chat Preferred  Rehab Office [phone]  Kate BRAVO Wendolyn 09/13/2024, 1:11 PM

## 2024-09-13 NOTE — Plan of Care (Signed)

## 2024-09-13 NOTE — Progress Notes (Addendum)
 HD#1 SUBJECTIVE:  Patient Summary: [name] is a 73 y.o. person living with a history of PMH of HFrEF (EF 20-25%) s/p ICD, COPD, CKD stage IV, T2DM, HTN, paroxysmal a flutter, psoriasis, tobacco use disorder who presented with SOB and admitted for acute hypoxic respiratory failure likely secondary to HF exacerbation  Overnight Events: No overnight events  Interim History: Patient states that she feels like her breathing is better than it was given 2 to 3 weeks ago.  She states that she was able to get up and go to walk to the shower today and she did feel tired but she said that she felt good.  She states that the pain in her wrist as about the same.  Do note that some of the swelling on her right arm has gone down with her splint but her middle finger is a little bit pinker and swollen today.  OBJECTIVE:  Vital Signs: Vitals:   09/12/24 2040 09/13/24 0030 09/13/24 0357 09/13/24 0740  BP: 125/74 105/71 132/79 115/63  Pulse: 92 88 98 95  Resp: 16 18 19 17   Temp: 97.6 F (36.4 C) 98.1 F (36.7 C) (!) 97.4 F (36.3 C) 97.6 F (36.4 C)  TempSrc: Oral Oral Oral Oral  SpO2: 93% 100% 97% 100%  Weight:      Height:       Supplemental O2: Nasal Cannula SpO2: 100 % O2 Flow Rate (L/min): 4 L/min  Filed Weights   09/12/24 1341  Weight: 75.6 kg     Intake/Output Summary (Last 24 hours) at 09/13/2024 1158 Last data filed at 09/13/2024 0401 Gross per 24 hour  Intake 414 ml  Output 250 ml  Net 164 ml   Net IO Since Admission: 164 mL [09/13/24 1158]  Physical Exam: Physical Exam Constitutional:      Comments: Very lethargic  Cardiovascular:     Rate and Rhythm: Normal rate and regular rhythm.     Comments: No murmur appreciated  Palpable DP pulses bilaterally.  Pulmonary:     Effort: Pulmonary effort is normal. No accessory muscle usage or respiratory distress.     Comments: On about 1 L of oxygen  Musculoskeletal:     Right lower leg: No edema.     Left lower leg: No  edema.     Comments: Does not appear to be pitting edema   Tenderness to PIP joint on middle finger.  Middle digit on right hand has pain with extension.  Finger appears to be pink Right wrist has tenderness to touch with limitation in extension of wrist.   Skin:    Comments: Scaly patches noted over extensor surface of left arm.  Rough dry skin appreciated on lower extremities.      Patient Lines/Drains/Airways Status     Active Line/Drains/Airways     Name Placement date Placement time Site Days   Peripheral IV 09/11/24 22 G 1.75 Anterior;Left Forearm 09/11/24  2120  Forearm  2            Pertinent labs and imaging:      Latest Ref Rng & Units 09/12/2024    3:08 AM 09/11/2024    5:45 PM 06/24/2024    6:50 PM  CBC  WBC 4.0 - 10.5 K/uL 8.9  9.0  12.3   Hemoglobin 12.0 - 15.0 g/dL 89.8  87.7  88.8   Hematocrit 36.0 - 46.0 % 33.7  41.1  37.1   Platelets 150 - 400 K/uL 250  232  309  Latest Ref Rng & Units 09/13/2024    2:12 AM 09/12/2024    1:58 PM 09/12/2024    3:08 AM  CMP  Glucose 70 - 99 mg/dL 878  92  891   BUN 8 - 23 mg/dL 29  29  29    Creatinine 0.44 - 1.00 mg/dL 8.51  8.60  8.71   Sodium 135 - 145 mmol/L 140  143  146   Potassium 3.5 - 5.1 mmol/L 4.4  4.5  4.0   Chloride 98 - 111 mmol/L 103  103  108   CO2 22 - 32 mmol/L 27  27  29    Calcium  8.9 - 10.3 mg/dL 8.5  8.6  8.6     No results found.   ASSESSMENT/PLAN:  Assessment: Principal Problem:   Acute hypoxic respiratory failure (HCC) Active Problems:   Essential hypertension   Chronic combined systolic and diastolic heart failure (HCC)   ICD (implantable cardioverter-defibrillator) in place   DM (diabetes mellitus) type II controlled with renal manifestation (HCC)   Hyperlipidemia   CKD (chronic kidney disease) stage 4, GFR 15-29 ml/min (HCC)   COPD (chronic obstructive pulmonary disease) (HCC)  [name] is a 73 y.o. person living with a history of PMH of HFrEF (EF 20-25%) s/p  ICD, COPD, CKD stage IV, T2DM, HTN, paroxysmal a flutter, psoriasis, tobacco use disorder who presented with SOB and admitted for acute hypoxic respiratory failure likely secondary to HF exacerbation   Plan: #Acute on Chronic Hypoxic Respiratory Failure 2/2 to acute on chronic HFrEF (chronic combined systolic and diastolic HF s/p ICD) COPD Patient is on 2L of oxygen  which she states she has been using at home.  She is saturating well on 2 L of oxygen .  Goal is 88 to 92%.  Patient reports that her breathing is better today.  Had side POCUS was done and it was noted that IVC is collapsible on this imaging.  This with symptom betterment and also decreased lower extremity edema, makes me feel that patient is close to euvolemia.  Will trial her torsemide  20 mg as a daily medication.  She may not need this as needed anymore.  Her weight is also down 3 kg from her dry weight. -will transition to torsemide  20 mg  - continue to monitor Is &Os and and get daily weights  - Continue Anoro Ellipta  inhaler, daily DuoNebs every 6 hours, continue albuterol  PRN ordered - guaifenesin  600 mg BID ordered  - monitor electrolytes: Goal K>4, Mg >2  - GDMT previously was bisoprolol  2.5 mg daily, spironolactone  12.5 mg daily, torsemide  20 mg.  Could consider adding Farxiga  before discharge.  Continue bisoprolol  and spironolactone  while inpatient. - I dont  believe patient is having COPD exacerbation, but could benefit from steroids to help reduce inflammation. Start prednisone  20 mg (dosing is primarily for R wrist inflammation)  Bilateral Wrist Pain  Psoriasis without hx of psoriatic arthritis  With patient's history of psoriasis she could have an increased risk for gout.  Upon ultrasound review bedside POCUS there is no clear tappable pocket.  We will dose the prednisone  to cover psoriatic arthritis and gout flare. - Will keep at prednisone  20 mg and assess response - will add voltaren  gel to see if this will help  -  Tylenol  650 mg every 6 hours as needed  - splint for protection  Paroxysmal atrial flutter Appears to be in sinus rhythm at this time.   - Apixaban  5 mg twice daily -Continue bisoprolol   2.5 mg daily  Recent decrease in physical function Could be likely mixed due to shortness of breath and due to family stressors in her life.  She has been relying on a cane and walker at times - Patient was able to speak to chaplain. Appreciate their help   - PT/OT assessing  #T2DM Blood sugars have been in range while here in the hospital.  Will watch blood sugars in setting of prednisone . - Last A1c was 8.6 - Glargine 5 units daily - Sliding scale insulin  ordered - Carb modified diet  #CKD 4 Need to be watched in the setting of diuresis. -Continue to monitor with BMP - Cr 1.48, Seems to be elevated above baseline  Chronic Conditions:  HTN BP has been more normotensive today.  - Management as per HFrEF  HLD Lipid panel 01/2024: Cholesterol 194, TG 66, HDL 59, LDL 122 - Atorvastatin  80 mg   Tobacco Use Disorder Current smoker. Cutting down on smoking (previously 3-5 cigarettes a day, now 1-2).   Multiple Lung Nodules incidentally noted on CT 04/2024 - CT chest ordered 06/05/2024  Best Practice: Diet: Diabetic diet IVF: Fluids: none VTE:  Code: Full  Disposition planning: Therapy Recs: pending Family Contact: Patient does not want family to be notified  DISPO: Anticipated discharge pending further management   Signature:  Eleanna Theilen D'Mello [name]  11:58 AM, 09/13/2024  On Call pager [phone]

## 2024-09-13 NOTE — Progress Notes (Signed)
 Mobility Specialist Progress Note:    09/13/24 1413  Mobility  Activity Respositioned in chair (Ankle Pump, Leg Lifts, Heel Slides)  Level of Assistance Standby assist, set-up cues, supervision of patient - no hands on  Assistive Device None  Range of Motion/Exercises Active  Activity Response Tolerated fair  Mobility Referral Yes  Mobility visit 1 Mobility  Mobility Specialist Start Time (ACUTE ONLY) 1413  Mobility Specialist Stop Time (ACUTE ONLY) 1420  Mobility Specialist Time Calculation (min) (ACUTE ONLY) 7 min   Received pt sitting in recliner agreeable to session. No c/o any symptoms. Pt able to move and perform exercises well. Left pt in bed w/ all needs met.   Venetia Keel Mobility Specialist Please Neurosurgeon or Rehab Office at [phone]

## 2024-09-13 NOTE — Progress Notes (Signed)
°  Progress Note   Date: 09/13/2024  Patient Name: [name]        MRN#: [id number]  Magnesium  was given for hypomagnesemia  Dx: hypomagnesemia

## 2024-09-13 NOTE — TOC Progression Note (Signed)
 Transition of Care (TOC) - Progression Note  Rayfield Gobble RN, BSN Inpatient Care Management Unit 4E- RN Case Manager See Treatment Team for direct phone # [phone] cross coverage  Patient Details  Name: [name] MRN: [id number] Date of Birth: 06-Mar-1951  Transition of Care [facility]) CM/SW Contact  Gobble Rayfield Hurst, RN Phone Number: 09/13/2024, 3:16 PM  Clinical Narrative:    Follow up done for [facility] referral- choice offered to pt on [facility] agencies that responded in the HUB- pt voiced that she does not have a preference on [facility] provider- she states she is not going to rehab.   Referral accepted in the Hub w/ [facility]- call made to liaison Burnard and confirmed they can staff for needed services- Burnard will follow for MD orders- msg sent to MD for [facility] orders- PT/OT/social work- will need to make sure orders placed prior to discharge.     Expected Discharge Plan: Home w Home Health Services Barriers to Discharge: Continued Medical Work up               Expected Discharge Plan and Services   Discharge Planning Services: CM Consult Post Acute Care Choice: Home Health Living arrangements for the past 2 months: Single Family Home                           HH Arranged: PT, OT, Social Work [facility] Agency: [facility] Date HH Agency Contacted: 09/13/24 Time HH Agency Contacted: 1516 Representative spoke with at [facility] Agency: [facility]   Social Drivers of Health (SDOH) Interventions SDOH Screenings   Food Insecurity: No Food Insecurity (09/12/2024)  Housing: Low Risk (09/12/2024)  Transportation Needs: No Transportation Needs (09/12/2024)  Utilities: Not At Risk (09/12/2024)  Alcohol Screen: Low Risk (01/09/2024)  Depression (PHQ2-9): Low Risk (06/19/2024)  Financial Resource Strain: Low Risk (01/09/2024)  Physical Activity: Inactive (01/09/2024)  Social Connections: Moderately Integrated (09/12/2024)  Stress: No Stress Concern Present (01/09/2024)  Tobacco Use: High Risk  (09/11/2024)  Health Literacy: Adequate Health Literacy (01/09/2024)    Readmission Risk Interventions    06/22/2022   11:43 AM  Readmission Risk Prevention Plan  Transportation Screening Complete  PCP or Specialist Appt within 5-7 Days Complete  Home Care Screening Complete  Medication Review (RN CM) Referral to Pharmacy

## 2024-09-14 ENCOUNTER — Other Ambulatory Visit (HOSPITAL_COMMUNITY): Payer: Self-pay

## 2024-09-14 ENCOUNTER — Encounter (HOSPITAL_COMMUNITY): Payer: Self-pay | Admitting: Internal Medicine

## 2024-09-14 ENCOUNTER — Inpatient Hospital Stay (HOSPITAL_COMMUNITY)

## 2024-09-14 LAB — BASIC METABOLIC PANEL WITH GFR
Anion gap: 6 (ref 5–15)
BUN: 37 mg/dL — ABNORMAL HIGH (ref 8–23)
CO2: 32 mmol/L (ref 22–32)
Calcium: 8.6 mg/dL — ABNORMAL LOW (ref 8.9–10.3)
Chloride: 101 mmol/L (ref 98–111)
Creatinine, Ser: 1.64 mg/dL — ABNORMAL HIGH (ref 0.44–1.00)
GFR, Estimated: 33 mL/min — ABNORMAL LOW (ref >60)
Glucose, Bld: 216 mg/dL — ABNORMAL HIGH (ref 70–99)
Potassium: 5.1 mmol/L (ref 3.5–5.1)
Sodium: 139 mmol/L (ref 135–145)

## 2024-09-14 LAB — GLUCOSE, CAPILLARY
Glucose-Capillary: 175 mg/dL — ABNORMAL HIGH (ref 70–99)
Glucose-Capillary: 159 mg/dL — ABNORMAL HIGH (ref 70–99)

## 2024-09-14 LAB — SEDIMENTATION RATE: Sed Rate: 67 mm/h — ABNORMAL HIGH (ref 0–22)

## 2024-09-14 LAB — MAGNESIUM: Magnesium: 2.4 mg/dL (ref 1.7–2.4)

## 2024-09-14 MED ORDER — TORSEMIDE 20 MG PO TABS
20.0000 mg | ORAL_TABLET | ORAL | 2 refills | Status: DC
  Filled 2024-09-14: qty 30, 60d supply, fill #0

## 2024-09-14 MED ORDER — PREDNISONE 20 MG PO TABS
20.0000 mg | ORAL_TABLET | Freq: Every day | ORAL | 0 refills | Status: DC
  Filled 2024-09-14: qty 5, 5d supply, fill #0

## 2024-09-14 MED ORDER — DICLOFENAC SODIUM 1 % EX GEL
4.0000 g | Freq: Four times a day (QID) | CUTANEOUS | 0 refills | Status: AC
  Filled 2024-09-14: qty 100, 30d supply, fill #0

## 2024-09-14 NOTE — TOC Transition Note (Signed)
 Transition of Care [facility]) - Discharge Note   Patient Details  Name: [name] MRN: [id number] Date of Birth: 10-02-1951  Transition of Care [facility]) CM/SW Contact:  Marval Gell, RN Phone Number: 09/14/2024, 12:37 PM   Clinical Narrative:     Notified [facility] that patient will DC today     Barriers to Discharge: Continued Medical Work up   Patient Goals and CMS Choice Patient states their goals for this hospitalization and ongoing recovery are:: To return home          Discharge Placement                       Discharge Plan and Services Additional resources added to the After Visit Summary for     Discharge Planning Services: CM Consult Post Acute Care Choice: Home Health                    HH Arranged: PT, OT, Social Work [facility] Agency: [facility] Date [place] Agency Contacted: 09/13/24 Time HH Agency Contacted: 1516 Representative spoke with at [facility] Agency: [facility]  Social Drivers of Health (SDOH) Interventions SDOH Screenings   Food Insecurity: No Food Insecurity (09/12/2024)  Housing: Low Risk (09/12/2024)  Transportation Needs: No Transportation Needs (09/12/2024)  Utilities: Not At Risk (09/12/2024)  Alcohol Screen: Low Risk (01/09/2024)  Depression (PHQ2-9): Low Risk (06/19/2024)  Financial Resource Strain: Low Risk (01/09/2024)  Physical Activity: Inactive (01/09/2024)  Social Connections: Moderately Integrated (09/12/2024)  Stress: No Stress Concern Present (01/09/2024)  Tobacco Use: High Risk (09/11/2024)  Health Literacy: Adequate Health Literacy (01/09/2024)     Readmission Risk Interventions    06/22/2022   11:43 AM  Readmission Risk Prevention Plan  Transportation Screening Complete  PCP or Specialist Appt within 5-7 Days Complete  Home Care Screening Complete  Medication Review (RN CM) Referral to Pharmacy

## 2024-09-14 NOTE — Discharge Instructions (Signed)
 You were hospitalized for having trouble breathing likely due to to your heart. Thank you for allowing us  to be part of your care.   Please arrange hospital follow-up with:  Someone will call you to arrange hospital follow-up.  If not please call our clinic on Monday to schedule an appointment  Please note these changes made to your medications:  Prednisone  20 mg for the next 5 days For your torsemide  please take this on Monday Wednesday Friday.  Please do not take this as needed anymore  Please call our clinic if you have any questions or concerns, we may be able to help and keep you from a long and expensive emergency room wait. Our clinic and after hours phone number is [phone], the best time to call is Monday through Friday 9 am to 4 pm but there is always someone available 24/7 if you have an emergency. If you need medication refills please notify your pharmacy one week in advance and they will send us  a request.

## 2024-09-14 NOTE — Discharge Summary (Signed)
 Name: [name] MRN: [id number] DOB: 1951/09/03 73 y.o. PCP: Peggy Homans, MD  Date of Admission: 09/11/2024  5:24 PM Date of Discharge: 09/14/2024  2:51 PM Attending Physician: Dr. MICAEL Riis [name]  Discharge Diagnosis: 1. Principal Problem:   Acute on chronic HFrEF (heart failure with reduced ejection fraction) (HCC) Active Problems:   Essential hypertension   ICD (implantable cardioverter-defibrillator) in place   DM2 (diabetes mellitus, type 2) (HCC)   AKI (acute kidney injury)   Spongiotic psoriasiform dermatitis   Hyperlipidemia   CKD (chronic kidney disease) stage 4, GFR 15-29 ml/min (HCC)   COPD (chronic obstructive pulmonary disease) (HCC)   Obesity (BMI 30-39.9)   Acute on chronic hypoxic respiratory failure (HCC)   Multiple lung nodules   Inflammatory monoarthritis of right wrist   Discharge Medications: Allergies as of 09/14/2024       Reactions   Actos [pioglitazone] Other (See Comments)   congestive heart failure   Naproxen  Other (See Comments)   500mg  dose made her sleep for two days   Rosiglitazone Hives   Metformin  And Related    Caused kidney problems    Tomato Itching   Hydrocodone -acetaminophen  Itching   Hydrocortisone Hives, Itching        Medication List     TAKE these medications    albuterol  108 (90 Base) MCG/ACT inhaler Commonly known as: VENTOLIN  HFA INHALE 2 PUFFS INTO THE LUNGS EVERY 6 HOURS AS NEEDED FOR WHEEZING OR SHORTNESS OF BREATH   apixaban  5 MG Tabs tablet Commonly known as: ELIQUIS  Take 1 tablet (5 mg total) by mouth 2 (two) times daily.   atorvastatin  80 MG tablet Commonly known as: LIPITOR  Take 1 tablet (80 mg total) by mouth at bedtime.   bisoprolol  5 MG tablet Commonly known as: ZEBETA  Take 0.5 tablets (2.5 mg total) by mouth daily.   Dexcom G7 Sensor Misc Change sensor every 10 days   diclofenac  Sodium 1 % Gel Commonly known as: VOLTAREN  Apply 4 g topically 4 (four) times daily.   insulin   degludec 100 UNIT/ML FlexTouch Pen Commonly known as: TRESIBA  Inject 5 Units into the skin daily.   ketoconazole  2 % shampoo Commonly known as: NIZORAL  Apply 1 Application topically once a week.   levocetirizine 5 MG tablet Commonly known as: XYZAL  Take 1 tablet (5 mg total) by mouth at bedtime. What changed:  when to take this reasons to take this   OXYGEN  Inhale 2 L into the lungs at bedtime.   Ozempic  (2 MG/DOSE) 8 MG/3ML Sopn Generic drug: Semaglutide  (2 MG/DOSE) Inject 2 mg into the skin once a week.   predniSONE  20 MG tablet Commonly known as: DELTASONE  Take 1 tablet (20 mg total) by mouth daily with breakfast. Start taking on: September 15, 2024   spironolactone  25 MG tablet Commonly known as: ALDACTONE  Take 0.5 tablets (12.5 mg total) by mouth daily.   torsemide  20 MG tablet Commonly known as: DEMADEX  Take 1 tablet (20 mg total) by mouth every other day. Take this medication on Monday Wednesday and Friday What changed:  how much to take how to take this when to take this additional instructions   triamcinolone  ointment 0.1 % Commonly known as: KENALOG  APPLY TOPICALLY TWICE DAILY MIX WITH EUCERIN   umeclidinium-vilanterol 62.5-25 MCG/ACT Aepb Commonly known as: Anoro Ellipta  Inhale 1 puff into the lungs daily.        Disposition and follow-up:   [name] was discharged from [name] in [place]  condition.  At the [name] follow up visit please address:  1.  Lung nodules- Please follow-up on CT chest results for pulmonary nodules that were noted.  We were able to get this done while she was inpatient  Inflammatory arthritis of PIP joint and right wrist: Prednisone  20 mg seem to be improving patient's symptoms please make sure that there are no systemic signs of infection at follow-up and that this medication is continued to improve her symptoms.  Favoring that this is likely more gout with her history of psoriasis but cannot rule  out psoriatic arthritis flare  Creatinine/CKD-please check BMP.  Creatinine was elevated at the time of discharge and we do believe that this is likely due to diuresis but want to make sure that this is improved with her torsemide  being scheduled Monday Wednesday Friday now.  He is make adjustments to torsemide  and other medications depending on what creatinine comes back at.  2.  Labs / imaging needed at time of follow-up: BMP  3.  Pending labs/ test needing follow-up: CT chest  Follow-up Appointments:  Contact information for follow-up providers     Peggy Homans, MD. Schedule an appointment as soon as possible for a visit.   Specialty: Internal Medicine Contact information: [address] [place] [address] [phone]              Contact information for after-discharge care     Home Medical Care     [facility]) .   Service: Home Health Services Contact information: [address] [place]  [address] [phone]                      [name] Course by problem list: [name] is a 73 y.o. person living with a history of PMH of HFrEF (EF 20-25%) s/p ICD, COPD, CKD stage IV, T2DM, HTN, paroxysmal a flutter, psoriasis, tobacco use disorder who presented with SOB and admitted for acute hypoxic respiratory failure likely secondary to HF exacerbation    Acute on chronic hypoxic respiratory failure Secondary to acute on chronic HFrEF exacerbation COPD Patient came in with shortness of breath for around the last 9 days.  She did have some new cough and sputum production with sputum production noticed very on and off.  Did not feel the patient had full-blown COPD exacerbation but we did start her on prednisone  20 to reduce any inflammation.  So treating her with DuoNebs and Anoro Ellipta  inhaler with albuterol  as needed.  Also gave patient Mucinex ..  We need her GDMT as far as  bisoprolol , spironolactone  while inpatient.  Gave her IV diuresis 80 mg x 2.  Bedside POCUS was done and IVC was noted to be collapsible.  Patient does have some residual crackles in the bases of her lungs bilaterally but I do think this could also be due to atelectasis.  Overall she appears to be euvolemic at the time of discharge.  Did transition her to home torsemide  20 mg.  She was taking this previously as a as needed but we have now made it on a Monday Wednesday Friday schedule.  Bilateral Wrist Pain  Psoriasis without hx of psoriatic arthritis  Patient did come in with bilateral wrist pain.  No history of head trauma.  We did get x-rays of bilateral wrist that did not show any acute fractures but did show some osteoarthritis.  Did note though on physical exam the  right wrist appears to be edematous and tender to touch.  The subsequent day did notice that middle finger on right side was edematous warm to the touch and PIP joint was tender.  With her history of psoriasis she is at increased risk for gout.  Lower differential for infection with her overall picture being afebrile.  No history of gout or psoriatic arthritis.  With her kidney function to decide to treat empirically with prednisone  20 mg.  After 1 dose did notice decrease in edema in wrist as well as no tenderness to touch.  Will send patient home on prednisone  20 for another 5 days.  Sed rate was elevated at 67.  Paroxysmal atrial flutter Appears to be in sinus rhythm at this time.   - Apixaban  5 mg twice daily -Continue bisoprolol  2.5 mg daily   Recent decrease in physical function Could be likely mixed due to shortness of breath and due to family stressors in her life.  She has been relying on a cane and walker at times - Patient was able to speak to chaplain. Appreciate their help   - PT/OT assessed and recommended SNF but patient declined.  Did order home health for  #T2DM Blood sugars have been in range while here in the  [name].  Will watch blood sugars in setting of prednisone . - Last A1c was 8.6 - Glargine 5 units daily - Sliding scale insulin  ordered - Carb modified diet   #CKD 4 Need to be watched in the setting of diuresis. -Continue to monitor with BMP - Cr 1.64 at time of discharge. Needs to be followed up with BMP outpatient. Likely due to diuresis  HTN BP has been more normotensive throughout.  - Management as per HFrEF   HLD Lipid panel 01/2024: Cholesterol 194, TG 66, HDL 59, LDL 122 - Atorvastatin  80 mg   Tobacco Use Disorder Current smoker. Cutting down on smoking (previously 3-5 cigarettes a day, now 1-2).   Multiple Lung Nodules incidentally noted on CT 04/2024 - CT chest was ordered as an outpatient and it was then performed inpatient before patient complete.  Read is pending at the time of discharge.  Please follow-up on this in the outpatient setting   Discharge subjective: Patient stated that her breathing was a lot better today and she was ready to go home.  Also reported pain and swelling in her wrist and finger a lot better.  Discharge Exam:   BP 132/75 (BP Location: Right Arm)   Pulse 83   Temp (!) 97.1 F (36.2 C) (Oral)   Resp 17   Ht 5' (1.524 m)   Wt 75.6 kg   SpO2 91%   BMI 32.55 kg/m  Discharge exam:  Physical Exam Constitutional:      General: She is not in acute distress. Cardiovascular:     Rate and Rhythm: Normal rate and regular rhythm.     Heart sounds: No murmur heard. Pulmonary:     Effort: Pulmonary effort is normal. No tachypnea, accessory muscle usage or respiratory distress.     Comments: Crackles at the bases bilaterally posteriorly Musculoskeletal:     Comments: No pitting lower extremity edema  Neurological:     Mental Status: She is alert.    Skin:    Comments: Scaly patches noted over extensor surface of left arm.  Rough dry skin appreciated on lower extremities.    Pertinent Labs, Studies, and Procedures:     Latest Ref Rng &  Units 09/12/2024  3:08 AM 09/11/2024    5:45 PM 06/24/2024    6:50 PM  CBC  WBC 4.0 - 10.5 K/uL 8.9  9.0  12.3   Hemoglobin 12.0 - 15.0 g/dL 89.8  87.7  88.8   Hematocrit 36.0 - 46.0 % 33.7  41.1  37.1   Platelets 150 - 400 K/uL 250  232  309        Latest Ref Rng & Units 09/14/2024    2:32 AM 09/13/2024    2:12 AM 09/12/2024    1:58 PM  CMP  Glucose 70 - 99 mg/dL 783  878  92   BUN 8 - 23 mg/dL 37  29  29   Creatinine 0.44 - 1.00 mg/dL 8.35  8.51  8.60   Sodium 135 - 145 mmol/L 139  140  143   Potassium 3.5 - 5.1 mmol/L 5.1  4.4  4.5   Chloride 98 - 111 mmol/L 101  103  103   CO2 22 - 32 mmol/L 32  27  27   Calcium  8.9 - 10.3 mg/dL 8.6  8.5  8.6     DG Wrist 2 Views Left Result Date: 09/11/2024 CLINICAL DATA:  Wrist pain EXAM: LEFT WRIST - 2 VIEW; RIGHT WRIST - 2 VIEW COMPARISON:  11/03/2013 FINDINGS: Left wrist: Frontal and lateral views are obtained. No acute fracture, subluxation, or dislocation. Mild osteoarthritis greatest in the radial aspect of the carpus. Soft tissues are unremarkable. Right wrist: Frontal and lateral views are obtained. No acute fracture, subluxation, or dislocation. Mild osteoarthritis greatest in the radial aspect of the carpus. Soft tissues are unremarkable. IMPRESSION: 1. Mild osteoarthritis within the bilateral wrists. No acute bony abnormalities. Electronically Signed   By: Ozell Daring M.D.   On: 09/11/2024 21:43   DG Wrist 2 Views Right Result Date: 09/11/2024 CLINICAL DATA:  Wrist pain EXAM: LEFT WRIST - 2 VIEW; RIGHT WRIST - 2 VIEW COMPARISON:  11/03/2013 FINDINGS: Left wrist: Frontal and lateral views are obtained. No acute fracture, subluxation, or dislocation. Mild osteoarthritis greatest in the radial aspect of the carpus. Soft tissues are unremarkable. Right wrist: Frontal and lateral views are obtained. No acute fracture, subluxation, or dislocation. Mild osteoarthritis greatest in the radial aspect of the carpus. Soft tissues are  unremarkable. IMPRESSION: 1. Mild osteoarthritis within the bilateral wrists. No acute bony abnormalities. Electronically Signed   By: Ozell Daring M.D.   On: 09/11/2024 21:43   DG Chest 1 View Result Date: 09/11/2024 CLINICAL DATA:  Shortness of breath for 10 days, hypoxia EXAM: CHEST  1 VIEW COMPARISON:  06/24/2024 FINDINGS: Single frontal view of the chest demonstrates stable single lead pacer/AICD. Cardiac silhouette is enlarged. Lung volumes are diminished, with pulmonary vascular congestion and bilateral interstitial and ground-glass opacities consistent with edema. There are bilateral pleural effusions, left greater than right, with likely underlying lower lobe atelectasis. No pneumothorax. IMPRESSION: 1. Constellation of findings most consistent with congestive heart failure and pulmonary edema. Electronically Signed   By: Ozell Daring M.D.   On: 09/11/2024 18:11     Discharge Instructions: Discharge Instructions     Diet - low sodium heart healthy   Complete by: As directed    Increase activity slowly   Complete by: As directed        Signed: D'Mello, Elih Mooney, DO 09/14/2024, 4:35 PM

## 2024-09-14 NOTE — Plan of Care (Signed)
°  Problem: Coping: Goal: Ability to adjust to condition or change in health will improve Outcome: Progressing   Problem: Fluid Volume: Goal: Ability to maintain a balanced intake and output will improve Outcome: Progressing   Problem: Health Behavior/Discharge Planning: Goal: Ability to identify and utilize available resources and services will improve Outcome: Progressing Goal: Ability to manage health-related needs will improve Outcome: Progressing   Problem: Metabolic: Goal: Ability to maintain appropriate glucose levels will improve Outcome: Progressing   Problem: Nutritional: Goal: Progress toward achieving an optimal weight will improve Outcome: Progressing

## 2024-09-16 ENCOUNTER — Telehealth: Payer: Self-pay

## 2024-09-16 NOTE — Patient Instructions (Signed)
 Visit Information  Thank you for taking time to visit with me today. Please don't hesitate to contact me if I can be of assistance to you before our next scheduled telephone appointment.  Our next appointment is by telephone on Tuesday December 23rd at 10:00am  Following is a copy of your care plan:   Goals Addressed             This Visit's Progress    VBCI Transitions of Care (TOC) Care Plan       Problems:  Recent Hospitalization for treatment of CHF Hospital or ED Adm Risk: 89%  Goal:  Over the next 30 days, the patient will not experience hospital readmission  Interventions:   Heart Failure Interventions: Basic overview and discussion of pathophysiology of Heart Failure reviewed Provided education on low sodium diet Assessed need for readable accurate scales in home Provided education about placing scale on hard, flat surface Advised patient to weigh each morning after emptying bladder Discussed importance of daily weight and advised patient to weigh and record daily Reviewed role of diuretics in prevention of fluid overload and management of heart failure; Discussed the importance of keeping all appointments with provider Assessed social determinant of health barriers   Patient Self Care Activities:  Attend all scheduled provider appointments Call pharmacy for medication refills 3-7 days in advance of running out of medications Call provider office for new concerns or questions  Notify RN Care Manager of [facility] call rescheduling needs Participate in Transition of Care Program/Attend [facility] scheduled calls Perform all self care activities independently  Take medications as prescribed    Plan:  Telephone follow up appointment with care management team member scheduled for:  Tuesday December 23rd at 10:00am        Patient verbalizes understanding of instructions and care plan provided today and agrees to view in MyChart. Active MyChart status and patient understanding  of how to access instructions and care plan via MyChart confirmed with patient.     The patient has been provided with contact information for the care management team and has been advised to call with any health related questions or concerns.   Please call the care guide team at [phone] if you need to cancel or reschedule your appointment.   Please call the Suicide and Crisis Lifeline: 988 call the [place]  National Suicide Prevention Lifeline: 720-090-3733 or TTY: 820-481-2279 TTY (807)131-6093) to talk to a trained counselor if you are experiencing a Mental Health or Behavioral Health Crisis or need someone to talk to.  Medford Balboa, BSN, RN [place]  VBCI - [employer] [facility] RN Care Manager [phone]

## 2024-09-16 NOTE — Transitions of Care (Post Inpatient/ED Visit) (Signed)
 09/16/2024  Name: [name] MRN: [id number] DOB: 08/01/1951  Today's TOC FU Call Status: Today's TOC FU Call Status:: Successful TOC FU Call Completed TOC FU Call Complete Date: 09/16/24  Patient's Name and Date of Birth confirmed. Name, DOB  Transition Care Management Follow-up Telephone Call Date of Discharge: 09/14/24 Discharge Facility: [name]) Type of Discharge: Inpatient Admission Primary Inpatient Discharge Diagnosis:: Heart Failure How have you been since you were released from the hospital?: Better Any questions or concerns?: No  Items Reviewed: Did you receive and understand the discharge instructions provided?: Yes Medications obtained,verified, and reconciled?: Yes (Medications Reviewed) Any new allergies since your discharge?: No Dietary orders reviewed?: Yes Type of Diet Ordered:: Low sodium Heart Heakthy Do you have support at home?: Yes People in Home [RPT]: alone Name of Support/Comfort Primary Source: [name] Jeter  Medications Reviewed Today: Medications Reviewed Today     Reviewed by Moises Reusing, RN (Case Manager) on 09/16/24 at 1304  Med List Status: <None>   Medication Order Taking? Sig Documenting Provider Last Dose Status Informant  albuterol  (VENTOLIN  HFA) 108 (90 Base) MCG/ACT inhaler [id number] No INHALE 2 PUFFS INTO THE LUNGS EVERY 6 HOURS AS NEEDED FOR WHEEZING OR SHORTNESS OF BREATH Amoako, Prince, MD Past Week Active Self, Pharmacy Records  apixaban  (ELIQUIS ) 5 MG TABS tablet [id number] No Take 1 tablet (5 mg total) by mouth 2 (two) times daily. Charmayne Holmes, DO 09/10/2024 11:00 AM Active Self, Pharmacy Records  atorvastatin  (LIPITOR ) 80 MG tablet [id number] No Take 1 tablet (80 mg total) by mouth at bedtime. Elicia Sharper, DO 09/10/2024 Active Self, Pharmacy Records  bisoprolol  (ZEBETA ) 5 MG tablet [id number] No Take 0.5 tablets (2.5 mg total) by mouth daily. Charmayne Holmes, DO 09/10/2024 Active Self, Pharmacy Records  Continuous  Glucose Sensor (DEXCOM G7 SENSOR) MISC [id number]  Change sensor every 10 days Charmayne Holmes, DO  Active Self, Pharmacy Records  diclofenac  Sodium (VOLTAREN ) 1 % GEL [id number]  Apply 4 g topically 4 (four) times daily. D'Mello, Rosalyn, DO  Active   insulin  degludec (TRESIBA ) 100 UNIT/ML FlexTouch Pen [id number] No Inject 5 Units into the skin daily. D'Mello, Rosalyn, DO 09/10/2024 Active Self, Pharmacy Records  ketoconazole  (NIZORAL ) 2 % shampoo [id number]  Apply 1 Application topically once a week. [provider]  Active Self, Pharmacy Records           Med Note LEOBARDO, NICOLE   Sat Apr 13, 2024 10:40 PM) Patient uses on Tuesdays  levocetirizine (XYZAL ) 5 MG tablet [id number] No Take 1 tablet (5 mg total) by mouth at bedtime.  Patient taking differently: Take 5 mg by mouth at bedtime as needed for allergies.   Elicia Sharper, DO Past Week Active Self, Pharmacy Records           Med Note [facility] EVERN PULLING A   Tue May 07, 2024  3:07 PM)    OXYGEN  [id number] No Inhale 2 L into the lungs at bedtime. [provider] 09/11/2024 Active Self, Pharmacy Records  predniSONE  (DELTASONE ) 20 MG tablet [id number]  Take 1 tablet (20 mg total) by mouth daily with breakfast. D'Mello, Rosalyn, DO  Active   Semaglutide , 2 MG/DOSE, (OZEMPIC , 2 MG/DOSE,) 8 MG/3ML SOPN [id number] No Inject 2 mg into the skin once a week. Charmayne Holmes, DO 09/08/2024 Active Self, Pharmacy Records  spironolactone  (ALDACTONE ) 25 MG tablet [id number] No Take 0.5 tablets (12.5 mg total) by mouth daily. Charmayne Holmes, DO 09/10/2024 Active Self, Pharmacy Records  torsemide  (DEMADEX ) 20 MG tablet  [id number]  Take 1 tablet (20 mg total) by mouth every other day. Take this medication on Monday Wednesday and Friday D'Mello, Rosalyn, DO  Active   triamcinolone  ointment (KENALOG ) 0.1 % [id number] No APPLY TOPICALLY TWICE DAILY MIX WITH EUCERIN Amoako, Prince, MD 09/10/2024 Active Self, Pharmacy Records  umeclidinium-vilanterol [facility]  ELLIPTA) 62.5-25 MCG/ACT AEPB [id number] No Inhale 1 puff into the lungs daily. Charmayne Holmes, DO 09/10/2024 Active Self, Pharmacy Records            Home Care and Equipment/Supplies: Were Home Health Services Ordered?: Yes Name of Home Health Agency:: [facility] Has Agency set up a time to come to your home?: No EMR reviewed for Home Health Orders: Orders present/patient has not received call (refer to CM for follow-up) Any new equipment or medical supplies ordered?: No  Functional Questionnaire: Do you need assistance with bathing/showering or dressing?: No Do you need assistance with meal preparation?: No Do you need assistance with eating?: No Do you have difficulty maintaining continence: No Do you need assistance with getting out of bed/getting out of a chair/moving?: No Do you have difficulty managing or taking your medications?: No  Follow up appointments reviewed: PCP Follow-up appointment confirmed?: No (Message sent to [facility]) MD Provider Line [phone] Given: No Specialist Hospital Follow-up appointment confirmed?: NA Do you need transportation to your follow-up appointment?: No Do you understand care options if your condition(s) worsen?: Yes-patient verbalized understanding  SDOH Interventions Today    Flowsheet Row Most Recent Value  SDOH Interventions   Food Insecurity Interventions Intervention Not Indicated  Housing Interventions Intervention Not Indicated  Transportation Interventions Intervention Not Indicated  Utilities Interventions Intervention Not Indicated    Goals Addressed             This Visit's Progress    VBCI Transitions of Care (TOC) Care Plan       Problems:  Recent Hospitalization for treatment of CHF Hospital or ED Adm Risk: 89%  Goal:  Over the next 30 days, the patient will not experience hospital readmission  Interventions:   Heart Failure Interventions: Basic overview and discussion of pathophysiology of Heart  Failure reviewed Provided education on low sodium diet Assessed need for readable accurate scales in home Provided education about placing scale on hard, flat surface Advised patient to weigh each morning after emptying bladder Discussed importance of daily weight and advised patient to weigh and record daily Reviewed role of diuretics in prevention of fluid overload and management of heart failure; Discussed the importance of keeping all appointments with provider Assessed social determinant of health barriers   Patient Self Care Activities:  Attend all scheduled provider appointments Call pharmacy for medication refills 3-7 days in advance of running out of medications Call provider office for new concerns or questions  Notify RN Care Manager of [facility] call rescheduling needs Participate in Transition of Care Program/Attend [facility] scheduled calls Perform all self care activities independently  Take medications as prescribed    Plan:  Telephone follow up appointment with care management team member scheduled for:  Tuesday December 23rd at 10:00am        Medford Balboa, BSN, RN [place]  VBCI - [facility] RN Care Manager [phone]

## 2024-09-17 DIAGNOSIS — I509 Heart failure, unspecified: Secondary | ICD-10-CM

## 2024-09-17 DIAGNOSIS — I5022 Chronic systolic (congestive) heart failure: Secondary | ICD-10-CM

## 2024-09-19 ENCOUNTER — Telehealth: Payer: Self-pay

## 2024-09-19 ENCOUNTER — Telehealth: Payer: Self-pay | Admitting: *Deleted

## 2024-09-19 ENCOUNTER — Other Ambulatory Visit

## 2024-09-19 NOTE — Telephone Encounter (Signed)
 Patient was identified as falling into the [place] Measure - Diabetes.   Patient was: Appointment already scheduled for:  09/30/24.

## 2024-09-19 NOTE — Progress Notes (Deleted)
 09/19/2024 Name: [name] MRN: [id number] DOB: 01-23-1951  No chief complaint on file.   [name] is a 73 y.o. year old female who presented for a telephone visit.   They were referred to the pharmacist by their PCP for assistance in managing complex medication management. PMH includes HTN, HFrEF (EF 20-25%) s/p ICD, atrial flutter, COPD, T2DM with gastroparesis, MASL, CKD4, HLD, BMI > 30, tobacco use.   Subjective: Patient was last seen by [name], MD, on 05/06/24. At last visit, BP was 109/58 mmHg, HR 89 bpm. Patient appeared to be volume overloaded in the setting of holding potassium, spironolactone , and torsemide  for AKI and hyperkalemia. She was restarted on torsemide  20 mg daily. Her labs returned with Scr further increased from 2.39 > 3.30, K 2.9 and she was directly admitted for IV diuresis. She was admitted from 05/07/24 to 05/13/24. She was found to have a UTI and Farxiga  was held during admission and at discharge. Her bisoprolol  and spironolactone  were dose-reduced. She was instructed to take torsemide  PRN.  She was discharged to SNF [facility]). She was initially scheduled for a telephone appt with pharmacy on 05/16/24, but this was reschedule since she was still residing at [place]. Patient was seen by cardiology, [name], PA on 05/21/24. Patient reported that she was taking torsemide  PRN for intermittent swelling, and was instructed to continue torsemide  20 mg PRN as she was hypervolemic on exam. No other changes were made to her medication regimen. She was engaged by pharmacy via telephone on 05/30/24. She reviewed all her medication bottles with me over the phone. Confirmed that she was receiving Ozempic  at the SNF and instructed her to resume. Communicated with PCP to consider restarting SGLT2i at f/u, if s/sx of UTI were resolved. She was seen by Dr. Charmayne on 06/05/24. Weight was stable, appeared euvolemic. Given soft BP (102/66 mmHg), did not restart SGLT2i. A1C had  increased to 8.6%, and Scr was elevated to 1.88 mg/dL in the setting of taking torsemide  daily. She was seen again by Dr. Charmayne on 06/19/24. Scr had improved to 1.34 mg/dL with PRN use of torsemide . BP was 144/81 mmHg. She was wearing CGM and TIR was 40%, TAR was 60%. She was instructed to increase Ozempic  to 2 mg weekly. At pharmacy call on 07/04/24, patient reported breathing was improved but BG were elevated to 300s in the setting of recent prednisone . She had not yet increased to Ozempic  2 mg weekly, but was instructed to do so when she finished her 1 mg pen.  Today, patient reports doing well. She states she is in bed during our call. She did increase Ozempic  to 2 mg weekly and is tolerating well. She had a Dexcom on that ended yesterday. Unable to review specific numbers, but she said she was mostly in the green zone.    Care Team: Primary Care Provider: [name], MD ; Needs to be scheduled- declined scheduling today since she was in bed  Cardiologist: [name], PA; Next Scheduled Visit: 08/22/24  Medication Access/Adherence  Current Pharmacy:  [id number] - [place], [place] - [address] AT [facility] [address] [place] [address] Phone: [phone] Fax: [phone]   Patient reports affordability concerns with their medications: No  Patient reports access/transportation concerns to their pharmacy: No  - she picks up her own medications from [facility] Patient reports adherence concerns with their medications:  Yes  - frequently forgets atorvastatin  (has excess supply). States  she has excess supply of Anoro Ellipta  but says she is taking daily.  Heart Failure (EF 20-25%):  Current medications:  ACEi/ARB/ARNI: none SGLT2i: Farxiga  held s/p UTI in August Beta blocker: bisoprolol  2.5 mg daily  Mineralocorticoid Receptor Antagonist: spironolactone  12.5 mg daily Diuretic regimen: torsemide  20 mg PRN (reports last dose was sometime last  week)  Current home weights: Reports weights are stable  Patient denies volume overload signs or symptoms including LE swelling. She is not needing torsemide  frequently.  Current physical activity: reports she has been able to get up and walk around at home  Current medication access support: [name]  Diabetes:  Current medications: insulin  degludec (Tresiba ) 5 units daily, Ozempic  2 mg once weekly Medications tried in the past: metformin  (renal function)  [facility] since increasing Ozempic  to 2 mg weekly  Current glucose readings: Reports her Dexcom came off yesterday. Reports sugars were mostly in the green zone. She uses receiver device to monitor sugars  Patient denies hypoglycemic s/sx including dizziness, shakiness, sweating. Patient denies hyperglycemic symptoms including polyuria, polydipsia, polyphagia, nocturia, neuropathy, blurred vision.  Current meal patterns: Usually eating baked foods, chicken sandwich, salad.  - Drinks: Drinking water throughout the day, 3-4 bottles of water per day. Reports that she has juice 1-2x per day. Reports that she dilutes her juice with water.   Hyperlipidemia/ASCVD Risk Reduction  Current lipid lowering medications: atorvastatin  80 mg daily (reports she frequently forgets doses because it is written for her to take at bedtime)  COPD:  Current medications: Anoro ellipta  (umeclidinium-vilaternol) 62.5-25 mcg/act 1 puff daily  Atrial Flutter:  Current medications:  Rate Control: bisoprolol  2.5 mg daily Anticoagulation Regimen: Apixaban  5 mg BID   Objective:  BP Readings from Last 3 Encounters:  09/14/24 132/75  08/22/24 (!) 142/84  06/24/24 (!) 145/78    Lab Results  Component Value Date   HGBA1C 8.6 (H) 06/19/2024   HGBA1C 7.5 (A) 03/25/2024   HGBA1C 7.8 (A) 12/26/2023       Latest Ref Rng & Units 09/14/2024    2:32 AM 09/13/2024    2:12 AM 09/12/2024    1:58 PM  BMP  Glucose 70 - 99 mg/dL 783  878  92    BUN 8 - 23 mg/dL 37  29  29   Creatinine 0.44 - 1.00 mg/dL 8.35  8.51  8.60   Sodium 135 - 145 mmol/L 139  140  143   Potassium 3.5 - 5.1 mmol/L 5.1  4.4  4.5   Chloride 98 - 111 mmol/L 101  103  103   CO2 22 - 32 mmol/L 32  27  27   Calcium  8.9 - 10.3 mg/dL 8.6  8.5  8.6     Lab Results  Component Value Date   CHOL 194 12/28/2023   HDL 59 12/28/2023   LDLCALC 122 (H) 12/28/2023   LDLDIRECT 122.3 03/06/2007   TRIG 66 12/28/2023   CHOLHDL 3.3 12/28/2023    Medications Reviewed Today   Medications were not reviewed in this encounter       Assessment/Plan:   Heart Failure (EF 20-25%): - Currently appropriately managed, with room for optimization. Given severely reduced EF, would prioritize adding back SGLT2i to prevent HF hospitalization and mortality at next PCP appointment as long as patient does not have s/sx of an active UTI and glycemic control is improved. - Reviewed appropriate blood pressure monitoring technique and reviewed goal blood pressure - Reviewed to weigh daily and when to  contact cardiology with weight gain - Reviewed dietary modifications including limiting fluid intake to no more than 4 bottles of water per day.  - Recommend to continue current regimen: Current medications:  ACEi/ARB/ARNI: none SGLT2i: Farxiga  currently being held s/p UTI in Aug 2025- consider restarting at follow-up Beta blocker: bisoprolol  2.5 mg daily  Mineralocorticoid Receptor Antagonist: spironolactone  12.5 mg daily  Diuretic regimen: torsemide  20 mg PRN fluid/weight gain   Diabetes: - Currently uncontrolled with most recent A1C of 8.6% above goal <7%, and worsened from 7.5%. She is tolerating max-dose Ozempic  well. Encouraged continued use of Dexcom. - Reviewed long term cardiovascular and renal outcomes of uncontrolled blood sugar - Reviewed goal A1c, goal fasting, and goal 2 hour post prandial glucose - Reviewed hypoglycemia management plan and the rule of 15 - Reviewed  dietary modifications including  utilizing the healthy plate method, limiting portion size of carbohydrate foods, increasing intake of protein and non-starchy vegetables.  - Recommend to continue Ozempic  2 mg weekly  - Recommend to continue Tresiba  5 units daily - If she is able to restart Farxiga  at follow-up, this may also help achieve A1C goal  - Recommend to check glucose continuously with Dexcom G7 CGM and reader. - Next A1C due 09/18/24   Hyperlipidemia/ASCVD Risk Reduction: - Currently uncontrolled with most recent LDL-C of 122 mg/dL above goal < 70 mg/dL given U7IF + comorbidities. High intensity statin indicated. Advised patient she could take atorvastatin  in the morning or evening with her other medications if it is difficult to remember at bedtime - Reviewed long term complications of uncontrolled cholesterol - Recommend to continue atorvastatin  80 mg daily    Patient confirmed adherence to Anoro Ellipta  (COPD) and apixaban  5 mg BID (Afib)   Patient verbalized understanding of treatment plan.    Follow Up Plan:  Pharmacist telephone 09/19/24 PCP clinic visit needs to be scheduled - patient stated she would call back [facility] to schedule when she has her calendar   Lorain Baseman, PharmD [facility] [phone]

## 2024-09-19 NOTE — Telephone Encounter (Signed)
 Return call to [facility] PT with [facility]. Eval was done; pt wants to start driving again.; requesting verbal order for PT once a week x 5 weeks. Also requesting verbal order for Nurse eval for teaching/education of diabetes andmedications. VO given - sending to the Red Team/PCP for approval or denial.

## 2024-09-19 NOTE — Telephone Encounter (Signed)
 Copied from CRM #[id number]. Topic: Clinical - Home Health Verbal Orders >> Sep 19, 2024  8:54 AM Diannia H wrote: Caller/Agency: [facility] Callback Number: [phone] Service Requested: Physical Therapy Frequency: 1 week for 5 weeks, 1 time Nursing Eval  Any new concerns about the patient? No

## 2024-09-24 ENCOUNTER — Other Ambulatory Visit

## 2024-09-24 NOTE — Patient Instructions (Signed)
 Visit Information  Thank you for taking time to visit with me today. Please don't hesitate to contact me if I can be of assistance to you before our next scheduled telephone appointment.  Our next appointment is by telephone on Tuesday January 6th at 10:30am  Following is a copy of your care plan:   Goals Addressed             This Visit's Progress    VBCI Transitions of Care (TOC) Care Plan       Problems: (reviewed 09/24/24) Recent Hospitalization for treatment of CHF Hospital or ED Adm Risk: 89%  Goal: (reviewed 09/24/24) Over the next 30 days, the patient will not experience hospital readmission  Interventions: (reviewed 09/24/24)  Heart Failure Interventions: Basic overview and discussion of pathophysiology of Heart Failure reviewed Provided education on low sodium diet Assessed need for readable accurate scales in home Provided education about placing scale on hard, flat surface Advised patient to weigh each morning after emptying bladder Discussed importance of daily weight and advised patient to weigh and record daily Reviewed role of diuretics in prevention of fluid overload and management of heart failure; Discussed the importance of keeping all appointments with provider Assessed social determinant of health barriers  PCP appointment scheduled for 12/29  Patient Self Care Activities: (reviewed 09/24/24) Attend all scheduled provider appointments Call pharmacy for medication refills 3-7 days in advance of running out of medications Call provider office for new concerns or questions  Notify RN Care Manager of [facility] call rescheduling needs Participate in Transition of Care Program/Attend [facility] scheduled calls Perform all self care activities independently  Take medications as prescribed    Plan:  Telephone follow up appointment with care management team member scheduled for:  Tuesday January 6th at 10:30am        Patient verbalizes understanding of instructions  and care plan provided today and agrees to view in MyChart. Active MyChart status and patient understanding of how to access instructions and care plan via MyChart confirmed with patient.     The patient has been provided with contact information for the care management team and has been advised to call with any health related questions or concerns.   Please call the care guide team at [phone] if you need to cancel or reschedule your appointment.   Please call the Suicide and Crisis Lifeline: 988 call the [place]  National Suicide Prevention Lifeline: 416-392-9693 or TTY: 7185337711 TTY 320-491-3919) to talk to a trained counselor if you are experiencing a Mental Health or Behavioral Health Crisis or need someone to talk to.  Medford Balboa, BSN, RN [place]  VBCI - [employer] [facility] RN Care Manager [phone]

## 2024-09-24 NOTE — Transitions of Care (Post Inpatient/ED Visit) (Signed)
 " Transition of Care week 2  Visit Note  09/24/2024  Name: [name] MRN: [id number]          DOB: 09-Jun-1951  Situation: Patient enrolled in [facility] 30-day program. Visit completed with Arland Gentry by telephone.   Background:   Initial Transition Care Management Follow-up Telephone Call Discharge Date and Diagnosis: 09/14/24, Heart Failure   Past Medical History:  Diagnosis Date   Acute metabolic encephalopathy 07/26/2022   ATN (acute tubular necrosis) 07/15/2014   Automatic implantable cardioverter-defibrillator in situ    Automatic implantable cardioverter-defibrillator in situ 05/04/2007   Qualifier: Diagnosis of  By: Kearney [place]     Blood transfusion without reported diagnosis    Cardiac defibrillator in situ    Atlas II VR (SJM) implanted by Dr Waddell   CHF (congestive heart failure) (HCC)    Chronic combined systolic and diastolic heart failure (HCC)    a. EF 35-40% in past;  b. Echo 7/13:  EF 45-50%, Gr 2 diast dysfn, mild AI, mild MAC, trivial MR, mild LAE, PASP 47.   Chronic ulcer of leg (HCC)    04-09-15 resolved-not a problem.   Colon polyps 04/12/2013   Rectosigmoid polyp   COPD (chronic obstructive pulmonary disease) (HCC)    O2 at night   Depression    Diabetes mellitus    Diabetes mellitus, type 2 (HCC) 04/03/2012   HIGH RISK FEET.. Please have patient take shoes and socks off every visit for visual foot inspection.     Eczema    Elevated alkaline phosphatase level    GGT and 5'nucleotidase 8/13 normal   Health care maintenance 01/22/2013   Surgically absent cervix- no pap needed (Path report 07/2000: uterine body with attached bilateral  adnexa and separate cervix.)   History of oxygen  administration    oxygen  @ 2 l/m nasally bedtime 24/7   HTN (hypertension)    Hx of cardiac cath    a. LHC 2003 normal;  b. LHC 6/13:  Mild calcification in the LM, o/w normal coronary arteries, EF 45%.    Hyperkalemia 08/08/2017   Hyperlipidemia    Elevated  triglyceride in 2019   Hyperlipidemia associated with type 2 diabetes mellitus (HCC) 05/25/2007   Qualifier: Diagnosis of  By: Gladis FNP, Nykedtra     Hyperthyroidism, subclinical    HYPERTHYROIDISM, SUBCLINICAL 05/06/2009   Qualifier: Diagnosis of  By: Rolan Hough     Implantable cardioverter-defibrillator (ICD) generator end of life    Inflammatory monoarthritis of right wrist 09/13/2024   Lipoma    Need for hepatitis C screening test 04/25/2019   NICM (nonischemic cardiomyopathy) (HCC)    Obesity    On home oxygen  therapy    2L; 24/7 (10/11/2016)   Post menopausal syndrome    Shortness of breath 07/14/2017   Skin rash 07/12/2018   Sleep apnea    pt denies 04/12/2013    Assessment: Patient Reported Symptoms: Cognitive Cognitive Status: Alert and oriented to person, place, and time, Normal speech and language skills      Neurological Neurological Review of Symptoms: No symptoms reported    HEENT HEENT Symptoms Reported: No symptoms reported      Cardiovascular Cardiovascular Symptoms Reported: Swelling in legs or feet Cardiovascular Management Strategies: Medication therapy, Routine screening, Weight management, Coping strategies, Activity Do You Have a Working Readable Scale?: Yes Weight: 168 lb (76.2 kg) Cardiovascular Comment: The patient hasn't weighed yet today  as she just got out of bed. She states her weights have  been about the same and flucuate 1-2 pounds. She is taking her medication. No SOB  Respiratory Respiratory Symptoms Reported: Dry cough Additional Respiratory Details: The patient states she has a little head cold but does not feel bad. Respiratory Management Strategies: Oxygen  therapy, Routine screening, Activity  Endocrine Endocrine Symptoms Reported: No symptoms reported Is patient diabetic?: Yes Is patient checking blood sugars at home?: Yes List most recent blood sugar readings, include date and time of day: 129 am Endocrine Comment: The  Prednisone  has been completed  Gastrointestinal Gastrointestinal Symptoms Reported: No symptoms reported      Genitourinary Genitourinary Symptoms Reported: No symptoms reported    Integumentary Integumentary Symptoms Reported: No symptoms reported    Musculoskeletal Musculoskelatal Symptoms Reviewed: Joint pain Additional Musculoskeletal Details: Wrist pain Musculoskeletal Management Strategies: Medication therapy, Activity      Psychosocial Psychosocial Symptoms Reported: No symptoms reported         Today's Vitals   09/24/24 1013  Weight: 168 lb (76.2 kg)   Pain Score: 5  Pain Type: Chronic pain Pain Location: Wrist Pain Orientation: Right, Left Pain Descriptors / Indicators: Aching, Discomfort Pain Onset: On-going Patients Stated Pain Goal: 0 Pain Intervention(s): Medication (See eMAR)  Medications Reviewed Today     Reviewed by Moises Reusing, RN (Case Manager) on 09/24/24 at 1008  Med List Status: <None>   Medication Order Taking? Sig Documenting Provider Last Dose Status Informant  albuterol  (VENTOLIN  HFA) 108 (90 Base) MCG/ACT inhaler [id number]  INHALE 2 PUFFS INTO THE LUNGS EVERY 6 HOURS AS NEEDED FOR WHEEZING OR SHORTNESS OF BREATH Amoako, Prince, MD  Active Self, Pharmacy Records  apixaban  (ELIQUIS ) 5 MG TABS tablet [id number]  Take 1 tablet (5 mg total) by mouth 2 (two) times daily. Charmayne Holmes, DO  Active Self, Pharmacy Records  atorvastatin  (LIPITOR ) 80 MG tablet [id number]  Take 1 tablet (80 mg total) by mouth at bedtime. Elicia Sharper, DO  Active Self, Pharmacy Records  bisoprolol  (ZEBETA ) 5 MG tablet [id number]  Take 0.5 tablets (2.5 mg total) by mouth daily. Charmayne Holmes, DO  Active Self, Pharmacy Records  Continuous Glucose Sensor (DEXCOM G7 SENSOR) [place] [id number]  Change sensor every 10 days Charmayne Holmes, DO  Active Self, Pharmacy Records  diclofenac  Sodium (VOLTAREN ) 1 % GEL [id number]  Apply 4 g topically 4 (four) times daily. D'Mello, Rosalyn,  DO  Active   insulin  degludec (TRESIBA ) 100 UNIT/ML FlexTouch Pen [id number]  Inject 5 Units into the skin daily. D'Mello, Rosalyn, DO  Active Self, Pharmacy Records  ketoconazole  (NIZORAL ) 2 % shampoo [id number]  Apply 1 Application topically once a week. [provider]  Active Self, Pharmacy Records           Med Note LEOBARDO, NICOLE   Sat Apr 13, 2024 10:40 PM) Patient uses on Tuesdays  levocetirizine (XYZAL ) 5 MG tablet [id number]  Take 1 tablet (5 mg total) by mouth at bedtime.  Patient taking differently: Take 5 mg by mouth at bedtime as needed for allergies.   Elicia Sharper, DO  Active Self, Pharmacy Records           Med Note [facility] EVERN PULLING A   Tue May 07, 2024  3:07 PM)    OXYGEN  [id number]  Inhale 2 L into the lungs at bedtime. [provider]  Active Self, Pharmacy Records  predniSONE  (DELTASONE ) 20 MG tablet [id number]  Take 1 tablet (20 mg total) by mouth daily with breakfast.  Patient not taking: Reported on 09/24/2024   D'Mello, Rosalyn,  DO  Active   Semaglutide , 2 MG/DOSE, (OZEMPIC , 2 MG/DOSE,) 8 MG/3ML SOPN [id number]  Inject 2 mg into the skin once a week. Charmayne Holmes, DO  Active Self, Pharmacy Records  spironolactone  (ALDACTONE ) 25 MG tablet [id number]  Take 0.5 tablets (12.5 mg total) by mouth daily. Charmayne Holmes, DO  Active Self, Pharmacy Records  torsemide  (DEMADEX ) 20 MG tablet [id number]  Take 1 tablet (20 mg total) by mouth every other day. Take this medication on Monday Wednesday and Friday D'Mello, Rosalyn, DO  Active   triamcinolone  ointment (KENALOG ) 0.1 % [id number]  APPLY TOPICALLY TWICE DAILY MIX WITH EUCERIN Amoako, Prince, MD  Active Self, Pharmacy Records  umeclidinium-vilanterol (ANORO ELLIPTA ) 62.5-25 MCG/ACT AEPB [id number]  Inhale 1 puff into the lungs daily. Charmayne Holmes, DO  Active Self, Pharmacy Records            Recommendation:   Continue Current Plan of Care  Follow Up Plan:   Telephone follow-up in 1 week  Medford Balboa, BSN, RN Ava  VBCI - [facility] RN Care Manager [phone]     "

## 2024-09-30 ENCOUNTER — Ambulatory Visit: Payer: Self-pay | Admitting: Student

## 2024-09-30 ENCOUNTER — Encounter: Payer: Self-pay | Admitting: Student

## 2024-09-30 VITALS — BP 134/79 | HR 88 | Temp 97.6°F | Ht 60.0 in | Wt 170.4 lb

## 2024-09-30 DIAGNOSIS — Z794 Long term (current) use of insulin: Secondary | ICD-10-CM | POA: Diagnosis not present

## 2024-09-30 DIAGNOSIS — M13131 Monoarthritis, not elsewhere classified, right wrist: Secondary | ICD-10-CM

## 2024-09-30 DIAGNOSIS — E1122 Type 2 diabetes mellitus with diabetic chronic kidney disease: Secondary | ICD-10-CM

## 2024-09-30 DIAGNOSIS — L409 Psoriasis, unspecified: Secondary | ICD-10-CM

## 2024-09-30 DIAGNOSIS — Z7985 Long-term (current) use of injectable non-insulin antidiabetic drugs: Secondary | ICD-10-CM | POA: Diagnosis not present

## 2024-09-30 DIAGNOSIS — N184 Chronic kidney disease, stage 4 (severe): Secondary | ICD-10-CM

## 2024-09-30 DIAGNOSIS — E042 Nontoxic multinodular goiter: Secondary | ICD-10-CM

## 2024-09-30 DIAGNOSIS — I5022 Chronic systolic (congestive) heart failure: Secondary | ICD-10-CM

## 2024-09-30 DIAGNOSIS — R918 Other nonspecific abnormal finding of lung field: Secondary | ICD-10-CM | POA: Diagnosis not present

## 2024-09-30 LAB — POCT GLYCOSYLATED HEMOGLOBIN (HGB A1C): HbA1c, POC (controlled diabetic range): 8.2 % — AB (ref 0.0–7.0)

## 2024-09-30 LAB — GLUCOSE, CAPILLARY: Glucose-Capillary: 133 mg/dL — ABNORMAL HIGH (ref 70–99)

## 2024-09-30 MED ORDER — TORSEMIDE 20 MG PO TABS: 20.0000 mg | ORAL_TABLET | ORAL | 3 refills | Status: AC

## 2024-09-30 MED ORDER — ATORVASTATIN CALCIUM 80 MG PO TABS: 80.0000 mg | ORAL_TABLET | Freq: Every day | ORAL | 3 refills | Status: AC

## 2024-09-30 NOTE — Assessment & Plan Note (Addendum)
" °  Status: Uncontrolled but improving.Last A1c 8.6 in 06/2024.  A1c today is 8.2.  Currently taking Ozempic  2 mg, Tresiba  5 unit daily.  Monitor: CGM (did not bring Dexcom receiver). Patient denies hypoglycemia.   Plan -Continue Ozempic  and Tresiba  given no glucose data  -Continue CGM, advised patient to bring device receiver at next OV -Checking BMP, hopefully to restart Farxiga   -A1c in 3 months -Ophthalmology exam: completed on 10/2023 -Urine ACR: check today if able to void -LDL 122, prescribed atorvastatin  80 mg but no recent dispense, encourage patient to pick up new Rx  Orders:   Basic metabolic panel with GFR   POC Hbg A1C   Microalbumin / Creatinine Urine Ratio   atorvastatin  (LIPITOR ) 80 MG tablet; Take 1 tablet (80 mg total) by mouth at bedtime.  "

## 2024-09-30 NOTE — Progress Notes (Signed)
 Internal Medicine Clinic Attending  Case discussed with the resident at the time of the visit.  We reviewed the resident's history and exam and pertinent patient test results.  I agree with the assessment, diagnosis, and plan of care documented in the resident's note.

## 2024-09-30 NOTE — Progress Notes (Addendum)
 "  CC: HFU  HPI: [name] is a 73 y.o. female living with a history stated below and presents today for HFU. Please see problem based assessment and plan for additional details.  Follow up Hospitalization  Patient was admitted to [facility] on 12/10 and discharged on 12/13. She was treated for HFrEF exacerbation. Treatment for this included IV diuresis. Telephone follow up was done on 12/15 She reports good compliance with treatment. She reports this condition is improved.  ----------------------------------------------------------------------------------------- -   Past Medical History:  Diagnosis Date   Acute metabolic encephalopathy 07/26/2022   ATN (acute tubular necrosis) 07/15/2014   Automatic implantable cardioverter-defibrillator in situ    Automatic implantable cardioverter-defibrillator in situ 05/04/2007   Qualifier: Diagnosis of  By: Kearney America     Blood transfusion without reported diagnosis    Cardiac defibrillator in situ    Atlas II VR (SJM) implanted by Dr Waddell   CHF (congestive heart failure) (HCC)    Chronic combined systolic and diastolic heart failure (HCC)    a. EF 35-40% in past;  b. Echo 7/13:  EF 45-50%, Gr 2 diast dysfn, mild AI, mild MAC, trivial MR, mild LAE, PASP 47.   Chronic ulcer of leg (HCC)    04-09-15 resolved-not a problem.   Colon polyps 04/12/2013   Rectosigmoid polyp   COPD (chronic obstructive pulmonary disease) (HCC)    O2 at night   Depression    Diabetes mellitus    Diabetes mellitus, type 2 (HCC) 04/03/2012   HIGH RISK FEET.. Please have patient take shoes and socks off every visit for visual foot inspection.     Eczema    Elevated alkaline phosphatase level    GGT and 5'nucleotidase 8/13 normal   Health care maintenance 01/22/2013   Surgically absent cervix- no pap needed (Path report 07/2000: uterine body with attached bilateral  adnexa and separate cervix.)   History of oxygen  administration    oxygen   @ 2 l/m nasally bedtime 24/7   HTN (hypertension)    Hx of cardiac cath    a. LHC 2003 normal;  b. LHC 6/13:  Mild calcification in the LM, o/w normal coronary arteries, EF 45%.    Hyperkalemia 08/08/2017   Hyperlipidemia    Elevated triglyceride in 2019   Hyperlipidemia associated with type 2 diabetes mellitus (HCC) 05/25/2007   Qualifier: Diagnosis of  By: Gladis FNP, Nykedtra     Hyperthyroidism, subclinical    HYPERTHYROIDISM, SUBCLINICAL 05/06/2009   Qualifier: Diagnosis of  By: Rolan Hough     Implantable cardioverter-defibrillator (ICD) generator end of life    Inflammatory monoarthritis of right wrist 09/13/2024   Lipoma    Need for hepatitis C screening test 04/25/2019   NICM (nonischemic cardiomyopathy) (HCC)    Obesity    On home oxygen  therapy    2L; 24/7 (10/11/2016)   Post menopausal syndrome    Shortness of breath 07/14/2017   Skin rash 07/12/2018   Sleep apnea    pt denies 04/12/2013    Medications Ordered Prior to Encounter[1]  Family History  Problem Relation Age of Onset   Stroke Mother    Seizures Father    Heart disease Father    Diabetes Sister    Asthma Maternal Aunt        aunts   Asthma Maternal Uncle        uncles   Heart disease Paternal Aunt        aunts   Heart disease  Paternal Uncle        uncles   Heart disease Maternal Aunt        aunts   Heart disease Maternal Uncle        uncles   Heart disease Maternal Grandfather    Breast cancer Daughter    Breast cancer Cousin    Asthma Grandchild    Colon cancer Neg Hx    Colon polyps Neg Hx    Esophageal cancer Neg Hx    Kidney disease Neg Hx    Gallbladder disease Neg Hx     Social History   Socioeconomic History   Marital status: Significant Other    Spouse name: Not on file   Number of children: 3   Years of education: 11   Highest education level: 11th grade  Occupational History   Occupation: disabled  Tobacco Use   Smoking status: Every Day    Current packs/day: 0.50     Average packs/day: 0.5 packs/day for 45.0 years (22.5 ttl pk-yrs)    Types: Cigarettes   Smokeless tobacco: Never   Tobacco comments:    currently smoking  10 cigs per day  Vaping Use   Vaping status: Never Used  Substance and Sexual Activity   Alcohol use: Not Currently    Alcohol/week: 3.0 standard drinks of alcohol    Types: 3 Shots of liquor per week   Drug use: No   Sexual activity: Yes    Partners: Male    Birth control/protection: Surgical  Other Topics Concern   Not on file  Social History Narrative   ** Merged History Encounter **       Married   Patient enjoys going to [employer], church and spending time with family   Social Drivers of Health   Tobacco Use: High Risk (09/11/2024)   Patient History    Smoking Tobacco Use: Every Day    Smokeless Tobacco Use: Never    Passive Exposure: Not on file  Financial Resource Strain: Low Risk (01/09/2024)   Overall Financial Resource Strain (CARDIA)    Difficulty of Paying Living Expenses: Not hard at all  Food Insecurity: No Food Insecurity (09/16/2024)   Epic    Worried About Radiation Protection Practitioner of Food in the Last Year: Never true    Ran Out of Food in the Last Year: Never true  Transportation Needs: No Transportation Needs (09/16/2024)   Epic    Lack of Transportation (Medical): No    Lack of Transportation (Non-Medical): No  Physical Activity: Inactive (01/09/2024)   Exercise Vital Sign    Days of Exercise per Week: 0 days    Minutes of Exercise per Session: 0 min  Stress: No Stress Concern Present (01/09/2024)   [employer] of Occupational Health - Occupational Stress Questionnaire    Feeling of Stress : Not at all  Social Connections: Moderately Integrated (09/12/2024)   Social Connection and Isolation Panel    Frequency of Communication with Friends and Family: More than three times a week    Frequency of Social Gatherings with Friends and Family: More than three times a week    Attends Religious Services: More  than 4 times per year    Active Member of [employer] or Organizations: Yes    Attends Banker Meetings: More than 4 times per year    Marital Status: Widowed  Intimate Partner Violence: Not At Risk (09/16/2024)   Epic    Fear of Current or Ex-Partner: No  Emotionally Abused: No    Physically Abused: No    Sexually Abused: No  Depression (PHQ2-9): Low Risk (09/30/2024)   Depression (PHQ2-9)    PHQ-2 Score: 0  Alcohol Screen: Low Risk (01/09/2024)   Alcohol Screen    Last Alcohol Screening Score (AUDIT): 0  Housing: Unknown (09/16/2024)   Epic    Unable to Pay for Housing in the Last Year: No    Number of Times Moved in the Last Year: Not on file    Homeless in the Last Year: No  Utilities: Not At Risk (09/16/2024)   Epic    Threatened with loss of utilities: No  Health Literacy: Adequate Health Literacy (01/09/2024)   B1300 Health Literacy    Frequency of need for help with medical instructions: Never    Review of Systems: ROS negative except for what is noted on the assessment and plan.  Vitals:   09/30/24 1052  BP: 134/79  Pulse: 88  Temp: 97.6 F (36.4 C)  TempSrc: Oral  SpO2: 93%  Weight: 170 lb 6.4 oz (77.3 kg)  Height: 5' (1.524 m)   Physical Exam: Constitutional: Alert, sitting up in chair comfortably, in no acute distress Cardiovascular: regular rate and rhythm Pulmonary/Chest: normal work of breathing on room air, mild bibasilar crackles MSK: mild swelling of b/l wrist but non-tender and no erythema  Neurological: alert & oriented x 3  Assessment & Plan:   Assessment & Plan Type 2 diabetes mellitus with diabetic chronic kidney disease, unspecified CKD stage, unspecified whether long term insulin  use (HCC)  Status: Uncontrolled but improving.Last A1c 8.6 in 06/2024.  A1c today is 8.2.  Currently taking Ozempic  2 mg, Tresiba  5 unit daily.  Monitor: CGM (did not bring Dexcom receiver). Patient denies hypoglycemia.   Plan -Continue Ozempic  and  Tresiba  given no glucose data  -Continue CGM, advised patient to bring device receiver at next OV -Checking BMP, hopefully to restart Farxiga   -A1c in 3 months -Ophthalmology exam: completed on 10/2023 -Urine ACR: check today if able to void -LDL 122, prescribed atorvastatin  80 mg but no recent dispense, encourage patient to pick up new Rx  Orders:   Basic metabolic panel with GFR   POC Hbg A1C   Microalbumin / Creatinine Urine Ratio   atorvastatin  (LIPITOR ) 80 MG tablet; Take 1 tablet (80 mg total) by mouth at bedtime.  Multiple thyroid  nodules CT showed Multinodular thyroid  with a largest left-sided nodule measuring 16 mm. Noted in 2023 w/ thyroid  US  and planned for FNA but unable to biopsy due to no discrete nodule found during the IR visit. Has not repeated thyroid  US  for f/u so will order today. TSH 2.087 9 months ago.   Orders:   US  THYROID ; Future  Psoriasis Inflammatory monoarthritis of right wrist Reports hx of this. Recent hospitalization w/ swelling and pain of b/l wrists, more of right wrist. Concern for inflammatory arthritis. Elevated ESR. The inpatient team discussed this w/ a rheumatologist. Was treated empirically w/ prednisone  20 mg w/ improvement of pain and swelling. Unable to aspirate joint during hospitalization. Patient states was seen by dermatology years ago but lost to f/u. Agreeable to rheumatology referral.   Orders:   Ambulatory referral to Rheumatology  Chronic systolic CHF (congestive heart failure) (HCC) Hospitalized 12/10 - 12/13 for acute on chronic hypoxic respiratory failure secondary to HFrEF exacerbation along with possible COPD.  Received IV diuresis with improvement.  Discharged on torsemide  20 mg on Monday, Wednesday, Friday.  Discharge weight was 166 pounds.  Today's weight was 170 pounds which appears to be her dry weight from prior notes.  GDMT: Bisoprolol  2.5 mg daily, spironolactone  12.5 mg daily  Did not tolerate ACE/ARB in the past.  Was  on Farxiga  but held months ago in the setting of UTI and was not restarted.  Plan - Patient to monitor weight at home and call if weight gain of 3 pounds in a day or 5 pounds in a week. - Continue GDMT including bisoprolol  and spironolactone  - Repeat BMP today, will consider restart of Farxiga   Orders:   Basic metabolic panel with GFR  Multiple lung nodules Incidental finding on CT chest in July of lung nodules.  Repeated CT chest during hospitalization with noted multifocal ground glass opacities bilaterally, noted concern for multifocal pneumonia.  Also noted bilateral pleural effusions.  Vitals have been stable.  Lung exam appears stable today with SpO2 stable on room air.  Less suspicious for pneumonia at this time.  However will repeat CT chest in 6-8 weeks to assess if resolved.    CKD (chronic kidney disease) stage 4, GFR 15-29 ml/min (HCC) Discharge GFR was 33 with creatinine 1.64 after IV diuresis.  Will recheck BMP today.  Follows with nephrology and had recent visit this month.  Orders:   Basic metabolic panel with GFR    Return in about 3 months (around 12/29/2024) for routine follow up.   Patient discussed with Dr. Shawn Ozell Nearing, D.O. [facility] Internal Medicine, PGY-3 Clinic Phone: [phone] Date 09/30/2024 Time 1:26 PM     [1]  Current Outpatient Medications on File Prior to Visit  Medication Sig Dispense Refill   albuterol  (VENTOLIN  HFA) 108 (90 Base) MCG/ACT inhaler INHALE 2 PUFFS INTO THE LUNGS EVERY 6 HOURS AS NEEDED FOR WHEEZING OR SHORTNESS OF BREATH 6.7 g 3   apixaban  (ELIQUIS ) 5 MG TABS tablet Take 1 tablet (5 mg total) by mouth 2 (two) times daily. 180 tablet 3   bisoprolol  (ZEBETA ) 5 MG tablet Take 0.5 tablets (2.5 mg total) by mouth daily. 45 tablet 1   Continuous Glucose Sensor (DEXCOM G7 SENSOR) MISC Change sensor every 10 days 9 each 3   diclofenac  Sodium (VOLTAREN ) 1 % GEL Apply 4 g topically 4 (four) times daily. 100 g 0   insulin   degludec (TRESIBA ) 100 UNIT/ML FlexTouch Pen Inject 5 Units into the skin daily.     ketoconazole  (NIZORAL ) 2 % shampoo Apply 1 Application topically once a week.     levocetirizine (XYZAL ) 5 MG tablet Take 1 tablet (5 mg total) by mouth at bedtime. (Patient taking differently: Take 5 mg by mouth at bedtime as needed for allergies.) 90 tablet 3   OXYGEN  Inhale 2 L into the lungs at bedtime.     predniSONE  (DELTASONE ) 20 MG tablet Take 1 tablet (20 mg total) by mouth daily with breakfast. (Patient not taking: Reported on 09/24/2024) 5 tablet 0   Semaglutide , 2 MG/DOSE, (OZEMPIC , 2 MG/DOSE,) 8 MG/3ML SOPN Inject 2 mg into the skin once a week. 3 mL 2   spironolactone  (ALDACTONE ) 25 MG tablet Take 0.5 tablets (12.5 mg total) by mouth daily. 50 each 0   torsemide  (DEMADEX ) 20 MG tablet Take 1 tablet (20 mg total) by mouth every other day. Take this medication on Monday Wednesday and Friday 30 tablet 2   triamcinolone  ointment (KENALOG ) 0.1 % APPLY TOPICALLY TWICE DAILY MIX WITH EUCERIN 160 g 0   umeclidinium-vilanterol (ANORO ELLIPTA ) 62.5-25 MCG/ACT AEPB Inhale 1 puff into the  lungs daily. 56 each 2   No current facility-administered medications on file prior to visit.   "

## 2024-09-30 NOTE — Assessment & Plan Note (Signed)
 Incidental finding on CT chest in July of lung nodules.  Repeated CT chest during hospitalization with noted multifocal ground glass opacities bilaterally, noted concern for multifocal pneumonia.  Also noted bilateral pleural effusions.  Vitals have been stable.  Lung exam appears stable today with SpO2 stable on room air.  Less suspicious for pneumonia at this time.  However will repeat CT chest in 6-8 weeks to assess if resolved.

## 2024-09-30 NOTE — Assessment & Plan Note (Addendum)
 Hospitalized 12/10 - 12/13 for acute on chronic hypoxic respiratory failure secondary to HFrEF exacerbation along with possible COPD.  Received IV diuresis with improvement.  Discharged on torsemide  20 mg on Monday, Wednesday, Friday.  Discharge weight was 166 pounds.  Today's weight was 170 pounds which appears to be her dry weight from prior notes.  GDMT: Bisoprolol  2.5 mg daily, spironolactone  12.5 mg daily  Did not tolerate ACE/ARB in the past.  Was on Farxiga  but held months ago in the setting of UTI and was not restarted.  Plan - Patient to monitor weight at home and call if weight gain of 3 pounds in a day or 5 pounds in a week. - Continue GDMT including bisoprolol  and spironolactone  - Repeat BMP today, will consider restart of Farxiga   Orders:   Basic metabolic panel with GFR

## 2024-09-30 NOTE — Patient Instructions (Addendum)
 Thank you, PeggyHanson [name] for allowing us  to provide your care today. Today we discussed:  -I am glad to hear you are feeling better.  -Continue with Torsemide  (fluid pill) and monitor your weight at home. -Plan to restart your Farxiga  10 mg daily -Repeat thyroid  ultrasound ordered -Referral sent to rheumatology  -Blood work today, I will call with results.   -Make sure to bring your Dexcom and all your medications at next visit  Follow up: 2-3 months   Should you have any questions or concerns please call the internal medicine clinic at [phone].    [name], D.O. [facility]

## 2024-09-30 NOTE — Assessment & Plan Note (Addendum)
 Discharge GFR was 33 with creatinine 1.64 after IV diuresis.  Will recheck BMP today.  Follows with nephrology and had recent visit this month.  Orders:   Basic metabolic panel with GFR

## 2024-09-30 NOTE — Assessment & Plan Note (Signed)
 Reports hx of this. Recent hospitalization w/ swelling and pain of b/l wrists, more of right wrist. Concern for inflammatory arthritis. Elevated ESR. The inpatient team discussed this w/ a rheumatologist. Was treated empirically w/ prednisone  20 mg w/ improvement of pain and swelling. Unable to aspirate joint during hospitalization. Patient states was seen by dermatology years ago but lost to f/u. Agreeable to rheumatology referral.   Orders:   Ambulatory referral to Rheumatology

## 2024-09-30 NOTE — Assessment & Plan Note (Addendum)
 CT showed Multinodular thyroid  with a largest left-sided nodule measuring 16 mm. Noted in 2023 w/ thyroid  US  and planned for FNA but unable to biopsy due to no discrete nodule found during the IR visit. Has not repeated thyroid  US  for f/u so will order today. TSH 2.087 9 months ago.   Orders:   US  THYROID ; Future

## 2024-10-01 ENCOUNTER — Ambulatory Visit: Payer: Self-pay | Admitting: Student

## 2024-10-01 DIAGNOSIS — E042 Nontoxic multinodular goiter: Secondary | ICD-10-CM

## 2024-10-01 LAB — MICROALBUMIN / CREATININE URINE RATIO
Creatinine, Urine: 224.9 mg/dL
Microalb/Creat Ratio: 62 mg/g{creat} — ABNORMAL HIGH (ref 0–29)
Microalbumin, Urine: 139.9 ug/mL

## 2024-10-01 LAB — BASIC METABOLIC PANEL WITH GFR
BUN/Creatinine Ratio: 21 (ref 12–28)
BUN: 29 mg/dL — ABNORMAL HIGH (ref 8–27)
CO2: 23 mmol/L (ref 20–29)
Calcium: 9.6 mg/dL (ref 8.7–10.3)
Chloride: 103 mmol/L (ref 96–106)
Creatinine, Ser: 1.39 mg/dL — ABNORMAL HIGH (ref 0.57–1.00)
Glucose: 114 mg/dL — ABNORMAL HIGH (ref 70–99)
Potassium: 4.7 mmol/L (ref 3.5–5.2)
Sodium: 141 mmol/L (ref 134–144)
eGFR: 41 mL/min/1.73 — ABNORMAL LOW

## 2024-10-01 MED ORDER — DAPAGLIFLOZIN PROPANEDIOL 10 MG PO TABS: 10.0000 mg | ORAL_TABLET | Freq: Every day | ORAL | 3 refills | Status: AC

## 2024-10-08 ENCOUNTER — Other Ambulatory Visit: Payer: Self-pay

## 2024-10-08 NOTE — Patient Instructions (Signed)
 Visit Information  Thank you for taking time to visit with me today. Please don't hesitate to contact me if I can be of assistance to you before our next scheduled telephone appointment.  Our next appointment is by telephone on Tuesday January 13th at 10:30am  Following is a copy of your care plan:   Goals Addressed             This Visit's Progress    VBCI Transitions of Care (TOC) Care Plan       Problems: (reviewed 10/08/24) Recent Hospitalization for treatment of CHF Hospital or ED Adm Risk: 89%  Goal:  (reviewed 10/08/24) Over the next 30 days, the patient will not experience hospital readmission  Interventions:  (reviewed 10/08/24)  Heart Failure Interventions: Basic overview and discussion of pathophysiology of Heart Failure reviewed Provided education on low sodium diet Assessed need for readable accurate scales in home Provided education about placing scale on hard, flat surface Advised patient to weigh each morning after emptying bladder Discussed importance of daily weight and advised patient to weigh and record daily Reviewed role of diuretics in prevention of fluid overload and management of heart failure; Discussed the importance of keeping all appointments with provider Assessed social determinant of health barriers  PCP appointment scheduled for 12/29 - completed Encourage the patient to take her statin even though she thinks she doesn't need it.  Thyroid  US  10/11/24 for multiple nodules RA referral for Psoriasis Patient to monitor weight at home and call if weight gain of 3 pounds in a day or 5 pounds in a week.  Restart Farxiga   Patient Self Care Activities: Patient to monitor weight at home and call if weight gain of 3 pounds in a day or 5 pounds in a week.  Attend all scheduled provider appointments Call pharmacy for medication refills 3-7 days in advance of running out of medications Call provider office for new concerns or questions  Notify RN Care Manager  of [facility] call rescheduling needs Participate in Transition of Care Program/Attend [facility] scheduled calls Perform all self care activities independently  Take medications as prescribed    Plan:  Telephone follow up appointment with care management team member scheduled for:  Tuesday January 13th at 10:30am        Patient verbalizes understanding of instructions and care plan provided today and agrees to view in MyChart. Active MyChart status and patient understanding of how to access instructions and care plan via MyChart confirmed with patient.     The patient has been provided with contact information for the care management team and has been advised to call with any health related questions or concerns.   Please call the care guide team at [phone] if you need to cancel or reschedule your appointment.   Please call the Suicide and Crisis Lifeline: 988 call the [place]  National Suicide Prevention Lifeline: 407-546-3627 or TTY: 253-344-5052 TTY 804-238-0398) to talk to a trained counselor if you are experiencing a Mental Health or Behavioral Health Crisis or need someone to talk to.  Medford Balboa, BSN, RN [place]  VBCI - [employer] [facility] RN Care Manager [phone]

## 2024-10-08 NOTE — Transitions of Care (Post Inpatient/ED Visit) (Signed)
 " Transition of Care week 3  Visit Note  10/08/2024  Name: [name] MRN: [id number]          DOB: [birth year]  Situation: Patient enrolled in [facility] 30-day program. Visit completed with Arland Gentry by telephone.   Background:   Initial Transition Care Management Follow-up Telephone Call Discharge Date and Diagnosis: 09/14/24, Heart Failure   Past Medical History:  Diagnosis Date   Acute metabolic encephalopathy 07/26/2022   ATN (acute tubular necrosis) 07/15/2014   Automatic implantable cardioverter-defibrillator in situ    Automatic implantable cardioverter-defibrillator in situ 05/04/2007   Qualifier: Diagnosis of  By: [place]     Blood transfusion without reported diagnosis    Cardiac defibrillator in situ    Atlas II VR (SJM) implanted by Dr Waddell   CHF (congestive heart failure) (HCC)    Chronic combined systolic and diastolic heart failure (HCC)    a. EF 35-40% in past;  b. Echo 7/13:  EF 45-50%, Gr 2 diast dysfn, mild AI, mild MAC, trivial MR, mild LAE, PASP 47.   Chronic ulcer of leg (HCC)    04-09-15 resolved-not a problem.   Colon polyps 04/12/2013   Rectosigmoid polyp   COPD (chronic obstructive pulmonary disease) (HCC)    O2 at night   Depression    Diabetes mellitus    Diabetes mellitus, type 2 (HCC) 04/03/2012   HIGH RISK FEET.. Please have patient take shoes and socks off every visit for visual foot inspection.     Eczema    Elevated alkaline phosphatase level    GGT and 5'nucleotidase 8/13 normal   Health care maintenance 01/22/2013   Surgically absent cervix- no pap needed (Path report 07/2000: uterine body with attached bilateral  adnexa and separate cervix.)   History of oxygen  administration    oxygen  @ 2 l/m nasally bedtime 24/7   HTN (hypertension)    Hx of cardiac cath    a. LHC 2003 normal;  b. LHC 6/13:  Mild calcification in the LM, o/w normal coronary arteries, EF 45%.    Hyperkalemia 08/08/2017   Hyperlipidemia    Elevated  triglyceride in 2019   Hyperlipidemia associated with type 2 diabetes mellitus (HCC) 05/25/2007   Qualifier: Diagnosis of  By: Gladis FNP, [place]     Hyperthyroidism, subclinical    HYPERTHYROIDISM, SUBCLINICAL 05/06/2009   Qualifier: Diagnosis of  By: Rolan Hough     Implantable cardioverter-defibrillator (ICD) generator end of life    Inflammatory monoarthritis of right wrist 09/13/2024   Lipoma    Need for hepatitis C screening test 04/25/2019   NICM (nonischemic cardiomyopathy) (HCC)    Obesity    On home oxygen  therapy    2L; 24/7 (10/11/2016)   Post menopausal syndrome    Shortness of breath 07/14/2017   Skin rash 07/12/2018   Sleep apnea    pt denies 04/12/2013    Assessment: Patient Reported Symptoms: Cognitive Cognitive Status: Alert and oriented to person, place, and time, Normal speech and language skills      Neurological      HEENT HEENT Symptoms Reported: No symptoms reported      Cardiovascular Cardiovascular Symptoms Reported: Swelling in legs or feet Does patient have uncontrolled Hypertension?: No Cardiovascular Management Strategies: Routine screening, Medication therapy, Adequate rest, Activity, Weight management Do You Have a Working Readable Scale?: Yes Weight: 170 lb (77.1 kg) Cardiovascular Comment: The patient states she is not weighing. She looks at her legs and she can tell if her  body is retaining fluid. She knows to call the MD if her legs keep swelling  Respiratory Respiratory Symptoms Reported: Dry cough, Shortness of breath Additional Respiratory Details: She states she is SOB at times. She had a CXR that showed mild infilitrates. Repeat CXR in 6-8 weeks Respiratory Management Strategies: Oxygen  therapy, Routine screening, Medication therapy, Adequate rest, Activity  Endocrine Endocrine Symptoms Reported: No symptoms reported Is patient diabetic?: Yes Is patient checking blood sugars at home?: Yes List most recent blood sugar readings,  include date and time of day: She hadn't taken it yet this morning. Her A1c improved from 8.6 to 8.2 Endocrine Comment: She has been restarted on Farxiga   Gastrointestinal Gastrointestinal Symptoms Reported: No symptoms reported      Genitourinary Genitourinary Symptoms Reported: No symptoms reported    Integumentary Integumentary Symptoms Reported: No symptoms reported    Musculoskeletal Musculoskelatal Symptoms Reviewed: Joint pain Additional Musculoskeletal Details: Wrist pain and Psoriasis Musculoskeletal Management Strategies: Medication therapy, Activity, Routine screening Musculoskeletal Comment: Referral to RA      Psychosocial Psychosocial Symptoms Reported: No symptoms reported         Today's Vitals   10/08/24 1053  Weight: 170 lb (77.1 kg)   Pain Scale: 0-10 Pain Score: 5  Pain Type: Chronic pain Pain Location: Wrist Pain Orientation: Right, Left Pain Descriptors / Indicators: Aching Pain Onset: On-going Patients Stated Pain Goal: 0 Pain Intervention(s): Medication (See eMAR)  Medications Reviewed Today     Reviewed by Moises Reusing, RN (Case Manager) on 10/08/24 at 1045  Med List Status: <None>   Medication Order Taking? Sig Documenting Provider Last Dose Status Informant  albuterol  (VENTOLIN  HFA) 108 (90 Base) MCG/ACT inhaler [id number]  INHALE 2 PUFFS INTO THE LUNGS EVERY 6 HOURS AS NEEDED FOR WHEEZING OR SHORTNESS OF BREATH Amoako, Prince, MD  Active Self, Pharmacy Records  apixaban  (ELIQUIS ) 5 MG TABS tablet [id number]  Take 1 tablet (5 mg total) by mouth 2 (two) times daily. Charmayne Holmes, DO  Active Self, Pharmacy Records  atorvastatin  (LIPITOR ) 80 MG tablet [id number] Yes Take 1 tablet (80 mg total) by mouth at bedtime. Zheng, Michael, DO  Active   bisoprolol  (ZEBETA ) 5 MG tablet [id number]  Take 0.5 tablets (2.5 mg total) by mouth daily. Charmayne Holmes, DO  Active Self, Pharmacy Records  Continuous Glucose Sensor (DEXCOM G7 SENSOR) [place] [id number]   Change sensor every 10 days Charmayne Holmes, DO  Active Self, Pharmacy Records  dapagliflozin  propanediol (FARXIGA ) 10 MG TABS tablet [id number] Yes Take 1 tablet (10 mg total) by mouth daily before breakfast. Elicia Sharper, DO  Active   diclofenac  Sodium (VOLTAREN ) 1 % GEL [id number]  Apply 4 g topically 4 (four) times daily. D'Mello, Rosalyn, DO  Active   insulin  degludec (TRESIBA ) 100 UNIT/ML FlexTouch Pen [id number]  Inject 5 Units into the skin daily. D'Mello, Rosalyn, DO  Active Self, Pharmacy Records  ketoconazole  (NIZORAL ) 2 % shampoo [id number]  Apply 1 Application topically once a week. [provider]  Active Self, Pharmacy Records           Med Note LEOBARDO, NICOLE   Sat Apr 13, 2024 10:40 PM) Patient uses on Tuesdays  levocetirizine (XYZAL ) 5 MG tablet [id number]  Take 1 tablet (5 mg total) by mouth at bedtime.  Patient taking differently: Take 5 mg by mouth at bedtime as needed for allergies.   Elicia Sharper, DO  Active Self, Pharmacy Records           Med Note [facility] [place], SUSAN  A   Tue May 07, 2024  3:07 PM)    OXYGEN  [id number]  Inhale 2 L into the lungs at bedtime. [provider]  Active Self, Pharmacy Records  Semaglutide , 2 MG/DOSE, (OZEMPIC , 2 MG/DOSE,) 8 MG/3ML SOPN [id number]  Inject 2 mg into the skin once a week. Charmayne Holmes, DO  Active Self, Pharmacy Records  spironolactone  (ALDACTONE ) 25 MG tablet [id number]  Take 0.5 tablets (12.5 mg total) by mouth daily. Charmayne Holmes, DO  Active Self, Pharmacy Records  torsemide  (DEMADEX ) 20 MG tablet [id number]  Take 1 tablet (20 mg total) by mouth every Monday, Wednesday, and Friday. Elicia Sharper, DO  Active   triamcinolone  ointment (KENALOG ) 0.1 % [id number]  APPLY TOPICALLY TWICE DAILY MIX WITH EUCERIN Amoako, Prince, MD  Active Self, Pharmacy Records  umeclidinium-vilanterol (ANORO ELLIPTA ) 62.5-25 MCG/ACT AEPB [id number]  Inhale 1 puff into the lungs daily. Charmayne Holmes, DO  Active Self, Pharmacy Records             Recommendation:   Continue Current Plan of Care  Follow Up Plan:   Telephone follow-up in 1 week  Medford Balboa, BSN, RN   VBCI - [facility] RN Care Manager [phone]     "

## 2024-10-10 NOTE — Addendum Note (Signed)
 Addended by: ELICIA SHARPER on: 10/10/2024 03:24 PM   Modules accepted: Level of Service

## 2024-10-11 ENCOUNTER — Ambulatory Visit (HOSPITAL_COMMUNITY): Admission: RE | Admit: 2024-10-11 | Discharge: 2024-10-11 | Disposition: A | Source: Ambulatory Visit

## 2024-10-11 DIAGNOSIS — E042 Nontoxic multinodular goiter: Secondary | ICD-10-CM | POA: Diagnosis present

## 2024-10-14 ENCOUNTER — Other Ambulatory Visit

## 2024-10-14 NOTE — Progress Notes (Unsigned)
 "  10/14/2024 Name: [name] MRN: [id number] DOB: 1951-03-14  No chief complaint on file.   [name] is a 74 y.o. year old female who presented for a telephone visit.   They were referred to the pharmacist by their PCP for assistance in managing complex medication management. PMH includes HTN, HFrEF (EF 20-25%) s/p ICD, atrial flutter, COPD, T2DM with gastroparesis, MASL, CKD4, HLD, BMI > 30, tobacco use.   Subjective: Patient was last seen by [name], MD, on 05/06/24. At last visit, BP was 109/58 mmHg, HR 89 bpm. Patient appeared to be volume overloaded in the setting of holding potassium, spironolactone , and torsemide  for AKI and hyperkalemia. She was restarted on torsemide  20 mg daily. Her labs returned with Scr further increased from 2.39 > 3.30, K 2.9 and she was directly admitted for IV diuresis. She was admitted from 05/07/24 to 05/13/24. She was found to have a UTI and Farxiga  was held during admission and at discharge. Her bisoprolol  and spironolactone  were dose-reduced. She was instructed to take torsemide  PRN.  She was discharged to SNF [facility]). She was initially scheduled for a telephone appt with pharmacy on 05/16/24, but this was reschedule since she was still residing at [place]. Patient was seen by cardiology, [name], PA on 05/21/24. Patient reported that she was taking torsemide  PRN for intermittent swelling, and was instructed to continue torsemide  20 mg PRN as she was hypervolemic on exam. No other changes were made to her medication regimen. She was engaged by pharmacy via telephone on 05/30/24. She reviewed all her medication bottles with me over the phone. Confirmed that she was receiving Ozempic  at the SNF and instructed her to resume. Communicated with PCP to consider restarting SGLT2i at f/u, if s/sx of UTI were resolved. She was seen by Dr. Charmayne on 06/05/24. Weight was stable, appeared euvolemic. Given soft BP (102/66 mmHg), did not restart SGLT2i. A1C had  increased to 8.6%, and Scr was elevated to 1.88 mg/dL in the setting of taking torsemide  daily. She was seen again by Dr. Charmayne on 06/19/24. Scr had improved to 1.34 mg/dL with PRN use of torsemide . BP was 144/81 mmHg. She was wearing CGM and TIR was 40%, TAR was 60%. She was instructed to increase Ozempic  to 2 mg weekly. At pharmacy call on 07/04/24, patient reported breathing was improved but BG were elevated to 300s in the setting of recent prednisone . She had not yet increased to Ozempic  2 mg weekly, but was instructed to do so when she finished her 1 mg pen.  Last seen by Dr. Elicia on 09/30/24, following admission at [name] from 09/11/24 to 09/14/24 for HFrEF exacerbation + COPD. A1C improved to 8.2% from 8.6%. She did not have her Dexcom receiver with her. Farxiga  10 mg daily was restarted for HF and T2DM. Patient was encouraged to refill atorvastatin  80 mg daily. ***     Care Team: Primary Care Provider: [name], MD ; Needs to be scheduled- declined scheduling today since she was in bed  Cardiologist: [name], PA; Next Scheduled Visit: 08/22/24  Medication Access/Adherence  Current Pharmacy:  [id number] - [place], [place] - [address] AT [facility] [address]  [place] [address] Phone: [phone] Fax: [phone]   Patient reports affordability concerns with their medications: No  Patient reports access/transportation concerns to their pharmacy: No  - she picks up her own medications from [facility] Patient reports adherence concerns with their medications:  Yes  -  frequently forgets atorvastatin  (has excess supply). States she has excess supply of Anoro Ellipta  but says she is taking daily.  Heart Failure (EF 20-25%):  Current medications:  ACEi/ARB/ARNI: none SGLT2i: Farxiga  held s/p UTI in August Beta blocker: bisoprolol  2.5 mg daily  Mineralocorticoid Receptor Antagonist: spironolactone  12.5 mg daily Diuretic regimen: torsemide  20 mg PRN  (reports last dose was sometime last week)  Current home weights: Reports weights are stable  Patient denies volume overload signs or symptoms including LE swelling. She is not needing torsemide  frequently.  Current physical activity: reports she has been able to get up and walk around at home  Current medication access support: [name]  Diabetes:  Current medications: insulin  degludec (Tresiba ) 5 units daily, Ozempic  2 mg once weekly Medications tried in the past: metformin  (renal function)  [facility] since increasing Ozempic  to 2 mg weekly  Current glucose readings: Reports her Dexcom came off yesterday. Reports sugars were mostly in the green zone. She uses receiver device to monitor sugars  Patient denies hypoglycemic s/sx including dizziness, shakiness, sweating. Patient denies hyperglycemic symptoms including polyuria, polydipsia, polyphagia, nocturia, neuropathy, blurred vision.  Current meal patterns: Usually eating baked foods, chicken sandwich, salad.  - Drinks: Drinking water throughout the day, 3-4 bottles of water per day. Reports that she has juice 1-2x per day. Reports that she dilutes her juice with water.   Hyperlipidemia/ASCVD Risk Reduction  Current lipid lowering medications: atorvastatin  80 mg daily (reports she frequently forgets doses because it is written for her to take at bedtime)  COPD:  Current medications: Anoro ellipta  (umeclidinium-vilaternol) 62.5-25 mcg/act 1 puff daily  Atrial Flutter:  Current medications:  Rate Control: bisoprolol  2.5 mg daily Anticoagulation Regimen: Apixaban  5 mg BID   Objective:  BP Readings from Last 3 Encounters:  09/30/24 134/79  09/14/24 132/75  08/22/24 (!) 142/84    Lab Results  Component Value Date   HGBA1C 8.2 (A) 09/30/2024   HGBA1C 8.6 (H) 06/19/2024   HGBA1C 7.5 (A) 03/25/2024       Latest Ref Rng & Units 09/30/2024   11:29 AM 09/14/2024    2:32 AM 09/13/2024    2:12 AM  BMP   Glucose 70 - 99 mg/dL 885  783  878   BUN 8 - 27 mg/dL 29  37  29   Creatinine 0.57 - 1.00 mg/dL 8.60  8.35  8.51   BUN/Creat Ratio 12 - 28 21     Sodium 134 - 144 mmol/L 141  139  140   Potassium 3.5 - 5.2 mmol/L 4.7  5.1  4.4   Chloride 96 - 106 mmol/L 103  101  103   CO2 20 - 29 mmol/L 23  32  27   Calcium  8.7 - 10.3 mg/dL 9.6  8.6  8.5     Lab Results  Component Value Date   CHOL 194 12/28/2023   HDL 59 12/28/2023   LDLCALC 122 (H) 12/28/2023   LDLDIRECT 122.3 03/06/2007   TRIG 66 12/28/2023   CHOLHDL 3.3 12/28/2023    Medications Reviewed Today   Medications were not reviewed in this encounter       Assessment/Plan:   Heart Failure (EF 20-25%): - Currently appropriately managed, with room for optimization. Given severely reduced EF, would prioritize adding back SGLT2i to prevent HF hospitalization and mortality at next PCP appointment as long as patient does not have s/sx of an active UTI and glycemic control is improved. - Reviewed appropriate blood pressure  monitoring technique and reviewed goal blood pressure - Reviewed to weigh daily and when to contact cardiology with weight gain - Reviewed dietary modifications including limiting fluid intake to no more than 4 bottles of water per day.  - Recommend to continue current regimen: Current medications:  ACEi/ARB/ARNI: none SGLT2i: Farxiga  currently being held s/p UTI in Aug 2025- consider restarting at follow-up Beta blocker: bisoprolol  2.5 mg daily  Mineralocorticoid Receptor Antagonist: spironolactone  12.5 mg daily  Diuretic regimen: torsemide  20 mg PRN fluid/weight gain   Diabetes: - Currently uncontrolled with most recent A1C of 8.6% above goal <7%, and worsened from 7.5%. She is tolerating max-dose Ozempic  well. Encouraged continued use of Dexcom. - Reviewed long term cardiovascular and renal outcomes of uncontrolled blood sugar - Reviewed goal A1c, goal fasting, and goal 2 hour post prandial glucose -  Reviewed hypoglycemia management plan and the rule of 15 - Reviewed dietary modifications including  utilizing the healthy plate method, limiting portion size of carbohydrate foods, increasing intake of protein and non-starchy vegetables.  - Recommend to continue Ozempic  2 mg weekly  - Recommend to continue Tresiba  5 units daily - If she is able to restart Farxiga  at follow-up, this may also help achieve A1C goal  - Recommend to check glucose continuously with Dexcom G7 CGM and reader. - Next A1C due 09/18/24   Hyperlipidemia/ASCVD Risk Reduction: - Currently uncontrolled with most recent LDL-C of 122 mg/dL above goal < 70 mg/dL given U7IF + comorbidities. High intensity statin indicated. Advised patient she could take atorvastatin  in the morning or evening with her other medications if it is difficult to remember at bedtime - Reviewed long term complications of uncontrolled cholesterol - Recommend to continue atorvastatin  80 mg daily    Patient confirmed adherence to Anoro Ellipta  (COPD) and apixaban  5 mg BID (Afib)   Patient verbalized understanding of treatment plan.    Follow Up Plan:  Pharmacist telephone 09/19/24 PCP clinic visit needs to be scheduled - patient stated she would call back [facility] to schedule when she has her calendar   Lorain Baseman, PharmD [facility] [phone]  "

## 2024-10-15 ENCOUNTER — Other Ambulatory Visit: Payer: Self-pay

## 2024-10-15 NOTE — Transitions of Care (Post Inpatient/ED Visit) (Signed)
 " Transition of Care week 4  Visit Note  10/15/2024  Name: [name] MRN: [id number]          DOB: 1951-05-24  Situation: Patient enrolled in [facility] 30-day program. Visit completed with Arland Gentry by telephone.   Background:   Initial Transition Care Management Follow-up Telephone Call Discharge Date and Diagnosis: 09/14/24, Heart Failure   Past Medical History:  Diagnosis Date   Acute metabolic encephalopathy 07/26/2022   ATN (acute tubular necrosis) 07/15/2014   Automatic implantable cardioverter-defibrillator in situ    Automatic implantable cardioverter-defibrillator in situ 05/04/2007   Qualifier: Diagnosis of  By: Kearney America     Blood transfusion without reported diagnosis    Cardiac defibrillator in situ    Atlas II VR (SJM) implanted by Dr Waddell   CHF (congestive heart failure) (HCC)    Chronic combined systolic and diastolic heart failure (HCC)    a. EF 35-40% in past;  b. Echo 7/13:  EF 45-50%, Gr 2 diast dysfn, mild AI, mild MAC, trivial MR, mild LAE, PASP 47.   Chronic ulcer of leg (HCC)    04-09-15 resolved-not a problem.   Colon polyps 04/12/2013   Rectosigmoid polyp   COPD (chronic obstructive pulmonary disease) (HCC)    O2 at night   Depression    Diabetes mellitus    Diabetes mellitus, type 2 (HCC) 04/03/2012   HIGH RISK FEET.. Please have patient take shoes and socks off every visit for visual foot inspection.     Eczema    Elevated alkaline phosphatase level    GGT and 5'nucleotidase 8/13 normal   Health care maintenance 01/22/2013   Surgically absent cervix- no pap needed (Path report 07/2000: uterine body with attached bilateral  adnexa and separate cervix.)   History of oxygen  administration    oxygen  @ 2 l/m nasally bedtime 24/7   HTN (hypertension)    Hx of cardiac cath    a. LHC 2003 normal;  b. LHC 6/13:  Mild calcification in the LM, o/w normal coronary arteries, EF 45%.    Hyperkalemia 08/08/2017   Hyperlipidemia    Elevated  triglyceride in 2019   Hyperlipidemia associated with type 2 diabetes mellitus (HCC) 05/25/2007   Qualifier: Diagnosis of  By: Gladis FNP, Nykedtra     Hyperthyroidism, subclinical    HYPERTHYROIDISM, SUBCLINICAL 05/06/2009   Qualifier: Diagnosis of  By: Rolan Hough     Implantable cardioverter-defibrillator (ICD) generator end of life    Inflammatory monoarthritis of right wrist 09/13/2024   Lipoma    Need for hepatitis C screening test 04/25/2019   NICM (nonischemic cardiomyopathy) (HCC)    Obesity    On home oxygen  therapy    2L; 24/7 (10/11/2016)   Post menopausal syndrome    Shortness of breath 07/14/2017   Skin rash 07/12/2018   Sleep apnea    pt denies 04/12/2013    Assessment: Patient Reported Symptoms: Cognitive Cognitive Status: Alert and oriented to person, place, and time, Normal speech and language skills      Neurological Neurological Review of Symptoms: Not assessed    HEENT HEENT Symptoms Reported: Not assessed      Cardiovascular Cardiovascular Symptoms Reported: Swelling in legs or feet Does patient have uncontrolled Hypertension?: No Cardiovascular Management Strategies: Routine screening, Medication therapy, Adequate rest, Activity, Weight management Do You Have a Working Readable Scale?: Yes (The patient does not weigh herself) Weight: 170 lb (77.1 kg) Cardiovascular Comment: The patient states her legs have a little bit  of edema and that she keeps a close eye on them She states she many need to increase her Torsemide  to daily instead of three times a week  Respiratory Respiratory Symptoms Reported: Dry cough Additional Respiratory Details: SOB with exertion. She has a biopsy on her lung nodule next week Respiratory Management Strategies: Oxygen  therapy, Routine screening, Medication therapy, Adequate rest, Activity  Endocrine Endocrine Symptoms Reported: No symptoms reported Is patient diabetic?: Yes Is patient checking blood sugars at home?:  Yes List most recent blood sugar readings, include date and time of day: BS are 95 and 105    Gastrointestinal Gastrointestinal Symptoms Reported: No symptoms reported      Genitourinary Genitourinary Symptoms Reported: No symptoms reported    Integumentary Integumentary Symptoms Reported: No symptoms reported    Musculoskeletal Musculoskelatal Symptoms Reviewed: Joint pain Additional Musculoskeletal Details: Wrist pain Musculoskeletal Management Strategies: Medication therapy, Routine screening, Activity      Psychosocial Psychosocial Symptoms Reported: No symptoms reported         Today's Vitals   10/15/24 1156  Weight: 170 lb (77.1 kg)   Pain Score: 4  Pain Type: Chronic pain Pain Location: Wrist Pain Orientation: Right Pain Descriptors / Indicators: Aching Pain Onset: On-going Patients Stated Pain Goal: 0 Pain Intervention(s): Medication (See eMAR)  Medications Reviewed Today     Reviewed by Moises Reusing, RN (Case Manager) on 10/15/24 at 1126  Med List Status: <None>   Medication Order Taking? Sig Documenting Provider Last Dose Status Informant  albuterol  (VENTOLIN  HFA) 108 (90 Base) MCG/ACT inhaler [id number] No INHALE 2 PUFFS INTO THE LUNGS EVERY 6 HOURS AS NEEDED FOR WHEEZING OR SHORTNESS OF BREATH Amoako, Prince, MD Past Week Active Self, Pharmacy Records  apixaban  (ELIQUIS ) 5 MG TABS tablet [id number] No Take 1 tablet (5 mg total) by mouth 2 (two) times daily. Charmayne Holmes, DO 09/10/2024 11:00 AM Active Self, Pharmacy Records  atorvastatin  (LIPITOR ) 80 MG tablet [id number]  Take 1 tablet (80 mg total) by mouth at bedtime. Elicia Sharper, DO  Active   bisoprolol  (ZEBETA ) 5 MG tablet [id number] No Take 0.5 tablets (2.5 mg total) by mouth daily. Charmayne Holmes, DO 09/10/2024 Active Self, Pharmacy Records  Continuous Glucose Sensor (DEXCOM G7 SENSOR) MISC [id number]  Change sensor every 10 days Charmayne Holmes, DO  Active Self, Pharmacy Records  dapagliflozin   propanediol (FARXIGA ) 10 MG TABS tablet [id number]  Take 1 tablet (10 mg total) by mouth daily before breakfast. Elicia Sharper, DO  Active   diclofenac  Sodium (VOLTAREN ) 1 % GEL [id number]  Apply 4 g topically 4 (four) times daily. D'Mello, Rosalyn, DO  Active   insulin  degludec (TRESIBA ) 100 UNIT/ML FlexTouch Pen [id number] No Inject 5 Units into the skin daily. D'Mello, Rosalyn, DO 09/10/2024 Active Self, Pharmacy Records  ketoconazole  (NIZORAL ) 2 % shampoo [id number]  Apply 1 Application topically once a week. [provider]  Active Self, Pharmacy Records           Med Note LEOBARDO, NICOLE   Sat Apr 13, 2024 10:40 PM) Patient uses on Tuesdays  levocetirizine (XYZAL ) 5 MG tablet [id number] No Take 1 tablet (5 mg total) by mouth at bedtime.  Patient taking differently: Take 5 mg by mouth at bedtime as needed for allergies.   Elicia Sharper, DO Past Week Active Self, Pharmacy Records           Med Note [facility] EVERN PULLING A   Tue May 07, 2024  3:07 PM)    OXYGEN  [id number] No Inhale 2 L  into the lungs at bedtime. [provider] 09/11/2024 Active Self, Pharmacy Records  Semaglutide , 2 MG/DOSE, (OZEMPIC , 2 MG/DOSE,) 8 MG/3ML SOPN [id number] No Inject 2 mg into the skin once a week. Charmayne Holmes, DO 09/08/2024 Active Self, Pharmacy Records  spironolactone  (ALDACTONE ) 25 MG tablet [id number] No Take 0.5 tablets (12.5 mg total) by mouth daily. Charmayne Holmes, DO 09/10/2024 Active Self, Pharmacy Records  torsemide  (DEMADEX ) 20 MG tablet [id number]  Take 1 tablet (20 mg total) by mouth every Monday, Wednesday, and Friday. Elicia Sharper, DO  Active   triamcinolone  ointment (KENALOG ) 0.1 % [id number] No APPLY TOPICALLY TWICE DAILY MIX WITH EUCERIN Amoako, Prince, MD 09/10/2024 Active Self, Pharmacy Records  umeclidinium-vilanterol (ANORO ELLIPTA ) 62.5-25 MCG/ACT AEPB [id number] No Inhale 1 puff into the lungs daily. Charmayne Holmes, DO 09/10/2024 Active Self, Pharmacy Records             Recommendation:   Continue Current Plan of Care  Follow Up Plan:   Telephone follow-up in 1 week  Medford Balboa, BSN, RN [place]  VBCI - [facility] RN Care Manager [phone]     "

## 2024-10-15 NOTE — Patient Instructions (Signed)
 Visit Information  Thank you for taking time to visit with me today. Please don't hesitate to contact me if I can be of assistance to you before our next scheduled telephone appointment.  Our next appointment is by telephone on Monday January 19th at 10:30am  Following is a copy of your care plan:   Goals Addressed             This Visit's Progress    VBCI Transitions of Care (TOC) Care Plan       Problems: (reviewed 10/15/24) Recent Hospitalization for treatment of CHF Hospital or ED Adm Risk: 89%  Goal:  (reviewed 10/15/24) Over the next 30 days, the patient will not experience hospital readmission  Interventions:  (reviewed 10/15/24)  Heart Failure Interventions: Basic overview and discussion of pathophysiology of Heart Failure reviewed Provided education on low sodium diet Assessed need for readable accurate scales in home Provided education about placing scale on hard, flat surface Advised patient to weigh each morning after emptying bladder Discussed importance of daily weight and advised patient to weigh and record daily Reviewed role of diuretics in prevention of fluid overload and management of heart failure; Discussed the importance of keeping all appointments with provider Assessed social determinant of health barriers  PCP appointment scheduled for 12/29 - completed Encourage the patient to take her statin even though she thinks she doesn't need it.  Thyroid  US  10/11/24 for multiple nodules - Completed RA referral for Psoriasis Patient to monitor weight at home and call if weight gain of 3 pounds in a day or 5 pounds in a week.  Restart Farxiga  10/15/24 - The patent does not weigh herself but monitors the swelling in her feet. She thinks she might need to be on additional Torsemide  instead of three times a week  Patient Self Care Activities: (reviewed 10/15/24) Attend all scheduled provider appointments Call pharmacy for medication refills 3-7 days in advance of  running out of medications Call provider office for new concerns or questions  Notify RN Care Manager of [facility] call rescheduling needs Participate in Transition of Care Program/Attend [facility] scheduled calls Perform all self care activities independently  Take medications as prescribed    Plan:  Telephone follow up appointment with care management team member scheduled for:  Monday  January 19th at 10:30am        Patient verbalizes understanding of instructions and care plan provided today and agrees to view in MyChart. Active MyChart status and patient understanding of how to access instructions and care plan via MyChart confirmed with patient.     The patient has been provided with contact information for the care management team and has been advised to call with any health related questions or concerns.   Please call the care guide team at [phone] if you need to cancel or reschedule your appointment.   Please call the Suicide and Crisis Lifeline: 988 call the [place]  National Suicide Prevention Lifeline: 475-846-8088 or TTY: 727 170 2631 TTY 361-020-3219) to talk to a trained counselor if you are experiencing a Mental Health or Behavioral Health Crisis or need someone to talk to.  Medford Balboa, BSN, RN [place]  VBCI - [employer] [facility] RN Care Manager [phone]

## 2024-10-17 ENCOUNTER — Telehealth: Payer: Self-pay | Admitting: *Deleted

## 2024-10-17 NOTE — Telephone Encounter (Signed)
 Return call to [facility] OT with [facility] requesting verbal order. Stated OT eval was completed today. Stated pt is almost at her baseline. Requesting OT once a week x 4 weeks. VO given - sending to doctor for approval or denial. Thanks

## 2024-10-17 NOTE — Telephone Encounter (Signed)
 Copied from CRM [id number]. Topic: Clinical - Home Health Verbal Orders >> Oct 17, 2024 11:24 AM DeAngela L wrote: Caller/Agency: Mallory with [facility] a  health therapist Callback Number: [phone] secured line  Service Requested: Occupational Therapy Frequency: 1w4  Any new concerns about the patient? No

## 2024-10-18 ENCOUNTER — Ambulatory Visit
Admission: RE | Admit: 2024-10-18 | Discharge: 2024-10-18 | Disposition: A | Discharge status: Home / Self Care | Source: Ambulatory Visit

## 2024-10-18 ENCOUNTER — Other Ambulatory Visit (HOSPITAL_COMMUNITY): Admission: RE | Admit: 2024-10-18 | Discharge: 2024-10-18 | Disposition: A | Source: Ambulatory Visit

## 2024-10-18 DIAGNOSIS — E041 Nontoxic single thyroid nodule: Secondary | ICD-10-CM

## 2024-10-18 DIAGNOSIS — E042 Nontoxic multinodular goiter: Secondary | ICD-10-CM

## 2024-10-21 ENCOUNTER — Other Ambulatory Visit: Payer: Self-pay

## 2024-10-21 LAB — CYTOLOGY - NON PAP

## 2024-10-21 NOTE — Transitions of Care (Post Inpatient/ED Visit) (Signed)
 " Transition of Care Week 5  Visit Note  10/21/2024  Name: [name] MRN: [id number]          DOB: 1951-04-03  Situation: Patient enrolled in [facility] 30-day program. Visit completed with Arland Gentry by telephone.   Background:   Initial Transition Care Management Follow-up Telephone Call Discharge Date and Diagnosis: No data recorded   Past Medical History:  Diagnosis Date   Acute metabolic encephalopathy 07/26/2022   ATN (acute tubular necrosis) 07/15/2014   Automatic implantable cardioverter-defibrillator in situ    Automatic implantable cardioverter-defibrillator in situ 05/04/2007   Qualifier: Diagnosis of  By: Kearney America     Blood transfusion without reported diagnosis    Cardiac defibrillator in situ    Atlas II VR (SJM) implanted by Dr Waddell   CHF (congestive heart failure) (HCC)    Chronic combined systolic and diastolic heart failure (HCC)    a. EF 35-40% in past;  b. Echo 7/13:  EF 45-50%, Gr 2 diast dysfn, mild AI, mild MAC, trivial MR, mild LAE, PASP 47.   Chronic ulcer of leg (HCC)    04-09-15 resolved-not a problem.   Colon polyps 04/12/2013   Rectosigmoid polyp   COPD (chronic obstructive pulmonary disease) (HCC)    O2 at night   Depression    Diabetes mellitus    Diabetes mellitus, type 2 (HCC) 04/03/2012   HIGH RISK FEET.. Please have patient take shoes and socks off every visit for visual foot inspection.     Eczema    Elevated alkaline phosphatase level    GGT and 5'nucleotidase 8/13 normal   Health care maintenance 01/22/2013   Surgically absent cervix- no pap needed (Path report 07/2000: uterine body with attached bilateral  adnexa and separate cervix.)   History of oxygen  administration    oxygen  @ 2 l/m nasally bedtime 24/7   HTN (hypertension)    Hx of cardiac cath    a. LHC 2003 normal;  b. LHC 6/13:  Mild calcification in the LM, o/w normal coronary arteries, EF 45%.    Hyperkalemia 08/08/2017   Hyperlipidemia    Elevated triglyceride in  2019   Hyperlipidemia associated with type 2 diabetes mellitus (HCC) 05/25/2007   Qualifier: Diagnosis of  By: Gladis FNP, Nykedtra     Hyperthyroidism, subclinical    HYPERTHYROIDISM, SUBCLINICAL 05/06/2009   Qualifier: Diagnosis of  By: Rolan Hough     Implantable cardioverter-defibrillator (ICD) generator end of life    Inflammatory monoarthritis of right wrist 09/13/2024   Lipoma    Need for hepatitis C screening test 04/25/2019   NICM (nonischemic cardiomyopathy) (HCC)    Obesity    On home oxygen  therapy    2L; 24/7 (10/11/2016)   Post menopausal syndrome    Shortness of breath 07/14/2017   Skin rash 07/12/2018   Sleep apnea    pt denies 04/12/2013    Assessment: Patient Reported Symptoms: Cognitive Cognitive Status: Alert and oriented to person, place, and time, Normal speech and language skills      Neurological Neurological Review of Symptoms: Not assessed    HEENT HEENT Symptoms Reported: Not assessed      Cardiovascular Cardiovascular Symptoms Reported: No symptoms reported Does patient have uncontrolled Hypertension?: No Cardiovascular Management Strategies: Routine screening, Medication therapy, Coping strategies, Adequate rest Cardiovascular Comment: The patient reports no swelling  Respiratory Respiratory Symptoms Reported: No symptoms reported Additional Respiratory Details: SOB with exertion Respiratory Management Strategies: Oxygen  therapy, Routine screening, Medication therapy, Adequate rest, Activity  Endocrine Endocrine Symptoms Reported: No symptoms reported Is patient diabetic?: Yes Is patient checking blood sugars at home?: Yes List most recent blood sugar readings, include date and time of day: 109 am    Gastrointestinal Gastrointestinal Symptoms Reported: No symptoms reported      Genitourinary Genitourinary Symptoms Reported: No symptoms reported    Integumentary Integumentary Symptoms Reported: No symptoms reported    Musculoskeletal  Musculoskelatal Symptoms Reviewed: Joint pain Additional Musculoskeletal Details: Wrist Musculoskeletal Management Strategies: Medication therapy, Routine screening Musculoskeletal Comment: RA appointment is in April      Psychosocial Psychosocial Symptoms Reported: No symptoms reported         There were no vitals filed for this visit.    Medications Reviewed Today     Reviewed by Moises Reusing, RN (Case Manager) on 10/21/24 at 1120  Med List Status: <None>   Medication Order Taking? Sig Documenting Provider Last Dose Status Informant  albuterol  (VENTOLIN  HFA) 108 (90 Base) MCG/ACT inhaler [id number] No INHALE 2 PUFFS INTO THE LUNGS EVERY 6 HOURS AS NEEDED FOR WHEEZING OR SHORTNESS OF SHERIDA [name], MD Past Week Active Self, Pharmacy Records  apixaban  (ELIQUIS ) 5 MG TABS tablet [id number] No Take 1 tablet (5 mg total) by mouth 2 (two) times daily. Charmayne Holmes, DO 09/10/2024 11:00 AM Active Self, Pharmacy Records  atorvastatin  (LIPITOR ) 80 MG tablet [id number]  Take 1 tablet (80 mg total) by mouth at bedtime. Zheng, Michael, DO  Active   bisoprolol  (ZEBETA ) 5 MG tablet [id number] No Take 0.5 tablets (2.5 mg total) by mouth daily. Charmayne Holmes, DO 09/10/2024 Active Self, Pharmacy Records  Continuous Glucose Sensor (DEXCOM G7 SENSOR) MISC [id number]  Change sensor every 10 days Charmayne Holmes, DO  Active Self, Pharmacy Records  dapagliflozin  propanediol (FARXIGA ) 10 MG TABS tablet [id number]  Take 1 tablet (10 mg total) by mouth daily before breakfast. Elicia Sharper, DO  Active   diclofenac  Sodium (VOLTAREN ) 1 % GEL [id number]  Apply 4 g topically 4 (four) times daily. D'Mello, Rosalyn, DO  Active   insulin  degludec (TRESIBA ) 100 UNIT/ML FlexTouch Pen [id number] No Inject 5 Units into the skin daily. D'Mello, Rosalyn, DO 09/10/2024 Active Self, Pharmacy Records  ketoconazole  (NIZORAL ) 2 % shampoo [id number]  Apply 1 Application topically once a week. [provider]  Active  Self, Pharmacy Records           Med Note LEOBARDO, NICOLE   Sat Apr 13, 2024 10:40 PM) Patient uses on Tuesdays  levocetirizine (XYZAL ) 5 MG tablet [id number] No Take 1 tablet (5 mg total) by mouth at bedtime.  Patient taking differently: Take 5 mg by mouth at bedtime as needed for allergies.   Elicia Sharper, DO Past Week Active Self, Pharmacy Records           Med Note [facility] EVERN PULLING A   Tue May 07, 2024  3:07 PM)    OXYGEN  [id number] No Inhale 2 L into the lungs at bedtime. [provider] 09/11/2024 Active Self, Pharmacy Records  Semaglutide , 2 MG/DOSE, (OZEMPIC , 2 MG/DOSE,) 8 MG/3ML SOPN [id number] No Inject 2 mg into the skin once a week. Charmayne Holmes, DO 09/08/2024 Active Self, Pharmacy Records  spironolactone  (ALDACTONE ) 25 MG tablet [id number] No Take 0.5 tablets (12.5 mg total) by mouth daily. Charmayne Holmes, DO 09/10/2024 Active Self, Pharmacy Records  torsemide  (DEMADEX ) 20 MG tablet [id number]  Take 1 tablet (20 mg total) by mouth every Monday, Wednesday, and Friday. Elicia Sharper, DO  Active   triamcinolone  ointment (KENALOG )  0.1 % [id number] No APPLY TOPICALLY TWICE DAILY MIX WITH EUCERIN Amoako, Prince, MD 09/10/2024 Active Self, Pharmacy Records  umeclidinium-vilanterol (ANORO ELLIPTA ) 62.5-25 MCG/ACT AEPB [id number] No Inhale 1 puff into the lungs daily. Charmayne Holmes, DO 09/10/2024 Active Self, Pharmacy Records            Recommendation:   Discharge from the [facility] Program. Goals met  Follow Up Plan:   Closing From:  Transitions of Care Program  [facility], BSN, RN [place]  VBCI - [facility] RN Care Manager [phone]     "

## 2024-10-21 NOTE — Patient Instructions (Signed)
 Visit Information  Thank you for taking time to visit with me today. Please don't hesitate to contact me if I can be of assistance to you before our next scheduled telephone appointment.  Following is a copy of your care plan:   Goals Addressed             This Visit's Progress    COMPLETED: VBCI Transitions of Care (TOC) Care Plan       Problems: (reviewed 10/21/24) Recent Hospitalization for treatment of CHF Hospital or ED Adm Risk: 89%  Goal:  (reviewed 10/21/24) Over the next 30 days, the patient will not experience hospital readmission  Interventions:  (reviewed 10/21/24)  Heart Failure Interventions: Basic overview and discussion of pathophysiology of Heart Failure reviewed Provided education on low sodium diet Assessed need for readable accurate scales in home Provided education about placing scale on hard, flat surface Advised patient to weigh each morning after emptying bladder Discussed importance of daily weight and advised patient to weigh and record daily Reviewed role of diuretics in prevention of fluid overload and management of heart failure; Discussed the importance of keeping all appointments with provider Assessed social determinant of health barriers  PCP appointment scheduled for 12/29 - completed Encourage the patient to take her statin even though she thinks she doesn't need it.  Thyroid  US  10/11/24 for multiple nodules - Completed RA referral for Psoriasis - Appointment in April Patient to monitor weight at home and call if weight gain of 3 pounds in a day or 5 pounds in a week.  Restart Farxiga  10/15/24 - The patent does not weigh herself but monitors the swelling in her feet. She thinks she might need to be on additional Torsemide  instead of three times a week  Patient Self Care Activities: (reviewed 10/21/24) Attend all scheduled provider appointments Call pharmacy for medication refills 3-7 days in advance of running out of medications Call provider  office for new concerns or questions  Notify RN Care Manager of TOC call rescheduling needs Participate in Transition of Care Program/Attend TOC scheduled calls Perform all self care activities independently  Take medications as prescribed    Plan:  Telephone follow up appointment with care management team member scheduled for:  the patient has completed her goals. Discharged from the [facility] 30 Day program        Patient verbalizes understanding of instructions and care plan provided today and agrees to view in MyChart. Active MyChart status and patient understanding of how to access instructions and care plan via MyChart confirmed with patient.     The patient has been provided with contact information for the care management team and has been advised to call with any health related questions or concerns.   Please call the care guide team at [phone] if you need to cancel or reschedule your appointment.   Please call the Suicide and Crisis Lifeline: 988 call the [place]  National Suicide Prevention Lifeline: 458-458-7762 or TTY: 520-240-2105 TTY (651)227-5468) to talk to a trained counselor if you are experiencing a Mental Health or Behavioral Health Crisis or need someone to talk to.  Medford Balboa, BSN, RN   VBCI - [employer] [facility] RN Care Manager [phone]

## 2024-10-30 ENCOUNTER — Inpatient Hospital Stay (HOSPITAL_COMMUNITY)
Admission: EM | Admit: 2024-10-30 | Discharge: 2024-11-01 | DRG: 291 | Disposition: A | Discharge status: Home / Self Care | Attending: Infectious Diseases | Admitting: Infectious Diseases

## 2024-10-30 ENCOUNTER — Other Ambulatory Visit: Payer: Self-pay

## 2024-10-30 ENCOUNTER — Encounter (HOSPITAL_COMMUNITY): Payer: Self-pay

## 2024-10-30 ENCOUNTER — Emergency Department (HOSPITAL_COMMUNITY)

## 2024-10-30 DIAGNOSIS — E8721 Acute metabolic acidosis: Secondary | ICD-10-CM | POA: Diagnosis present

## 2024-10-30 DIAGNOSIS — J449 Chronic obstructive pulmonary disease, unspecified: Secondary | ICD-10-CM | POA: Diagnosis present

## 2024-10-30 DIAGNOSIS — J9611 Chronic respiratory failure with hypoxia: Secondary | ICD-10-CM | POA: Diagnosis not present

## 2024-10-30 DIAGNOSIS — I130 Hypertensive heart and chronic kidney disease with heart failure and stage 1 through stage 4 chronic kidney disease, or unspecified chronic kidney disease: Secondary | ICD-10-CM | POA: Diagnosis not present

## 2024-10-30 DIAGNOSIS — F17200 Nicotine dependence, unspecified, uncomplicated: Secondary | ICD-10-CM | POA: Diagnosis present

## 2024-10-30 DIAGNOSIS — E870 Hyperosmolality and hypernatremia: Secondary | ICD-10-CM | POA: Diagnosis present

## 2024-10-30 DIAGNOSIS — Z7951 Long term (current) use of inhaled steroids: Secondary | ICD-10-CM

## 2024-10-30 DIAGNOSIS — Z888 Allergy status to other drugs, medicaments and biological substances status: Secondary | ICD-10-CM

## 2024-10-30 DIAGNOSIS — I4581 Long QT syndrome: Secondary | ICD-10-CM | POA: Diagnosis not present

## 2024-10-30 DIAGNOSIS — E119 Type 2 diabetes mellitus without complications: Secondary | ICD-10-CM

## 2024-10-30 DIAGNOSIS — Z886 Allergy status to analgesic agent status: Secondary | ICD-10-CM

## 2024-10-30 DIAGNOSIS — R112 Nausea with vomiting, unspecified: Secondary | ICD-10-CM | POA: Diagnosis present

## 2024-10-30 DIAGNOSIS — I509 Heart failure, unspecified: Secondary | ICD-10-CM

## 2024-10-30 DIAGNOSIS — Z7901 Long term (current) use of anticoagulants: Secondary | ICD-10-CM

## 2024-10-30 DIAGNOSIS — E1122 Type 2 diabetes mellitus with diabetic chronic kidney disease: Secondary | ICD-10-CM | POA: Diagnosis not present

## 2024-10-30 DIAGNOSIS — I5021 Acute systolic (congestive) heart failure: Secondary | ICD-10-CM | POA: Diagnosis not present

## 2024-10-30 DIAGNOSIS — N179 Acute kidney failure, unspecified: Secondary | ICD-10-CM | POA: Diagnosis present

## 2024-10-30 DIAGNOSIS — E1165 Type 2 diabetes mellitus with hyperglycemia: Secondary | ICD-10-CM | POA: Diagnosis present

## 2024-10-30 DIAGNOSIS — I4891 Unspecified atrial fibrillation: Secondary | ICD-10-CM | POA: Diagnosis not present

## 2024-10-30 DIAGNOSIS — Z720 Tobacco use: Secondary | ICD-10-CM | POA: Diagnosis not present

## 2024-10-30 DIAGNOSIS — I482 Chronic atrial fibrillation, unspecified: Secondary | ICD-10-CM | POA: Diagnosis present

## 2024-10-30 DIAGNOSIS — K3184 Gastroparesis: Secondary | ICD-10-CM | POA: Diagnosis present

## 2024-10-30 DIAGNOSIS — Z794 Long term (current) use of insulin: Secondary | ICD-10-CM

## 2024-10-30 DIAGNOSIS — Z91018 Allergy to other foods: Secondary | ICD-10-CM

## 2024-10-30 DIAGNOSIS — Z95810 Presence of automatic (implantable) cardiac defibrillator: Secondary | ICD-10-CM

## 2024-10-30 DIAGNOSIS — Z825 Family history of asthma and other chronic lower respiratory diseases: Secondary | ICD-10-CM

## 2024-10-30 DIAGNOSIS — R0602 Shortness of breath: Secondary | ICD-10-CM

## 2024-10-30 DIAGNOSIS — N1832 Chronic kidney disease, stage 3b: Secondary | ICD-10-CM | POA: Diagnosis not present

## 2024-10-30 DIAGNOSIS — Z8249 Family history of ischemic heart disease and other diseases of the circulatory system: Secondary | ICD-10-CM

## 2024-10-30 DIAGNOSIS — R9431 Abnormal electrocardiogram [ECG] [EKG]: Secondary | ICD-10-CM | POA: Diagnosis present

## 2024-10-30 DIAGNOSIS — Z79899 Other long term (current) drug therapy: Secondary | ICD-10-CM

## 2024-10-30 DIAGNOSIS — Z7985 Long-term (current) use of injectable non-insulin antidiabetic drugs: Secondary | ICD-10-CM

## 2024-10-30 DIAGNOSIS — E1143 Type 2 diabetes mellitus with diabetic autonomic (poly)neuropathy: Secondary | ICD-10-CM | POA: Diagnosis present

## 2024-10-30 DIAGNOSIS — R531 Weakness: Principal | ICD-10-CM

## 2024-10-30 DIAGNOSIS — F17210 Nicotine dependence, cigarettes, uncomplicated: Secondary | ICD-10-CM | POA: Diagnosis present

## 2024-10-30 DIAGNOSIS — Z833 Family history of diabetes mellitus: Secondary | ICD-10-CM

## 2024-10-30 DIAGNOSIS — I10 Essential (primary) hypertension: Secondary | ICD-10-CM | POA: Diagnosis present

## 2024-10-30 DIAGNOSIS — Z823 Family history of stroke: Secondary | ICD-10-CM

## 2024-10-30 DIAGNOSIS — I081 Rheumatic disorders of both mitral and tricuspid valves: Secondary | ICD-10-CM | POA: Diagnosis present

## 2024-10-30 LAB — URINALYSIS, ROUTINE W REFLEX MICROSCOPIC
Bilirubin Urine: NEGATIVE
Glucose, UA: NEGATIVE mg/dL
Hgb urine dipstick: NEGATIVE
Ketones, ur: 5 mg/dL — AB
Leukocytes,Ua: NEGATIVE
Nitrite: NEGATIVE
Protein, ur: 30 mg/dL — AB
Specific Gravity, Urine: 1.015 (ref 1.005–1.030)
pH: 5 (ref 5.0–8.0)

## 2024-10-30 LAB — TROPONIN T, HIGH SENSITIVITY
Troponin T High Sensitivity: 152 ng/L (ref 0–19)
Troponin T High Sensitivity: 167 ng/L (ref 0–19)
Troponin T High Sensitivity: 163 ng/L (ref 0–19)

## 2024-10-30 LAB — BASIC METABOLIC PANEL WITH GFR
Anion gap: 18 — ABNORMAL HIGH (ref 5–15)
Anion gap: 18 — ABNORMAL HIGH (ref 5–15)
BUN: 51 mg/dL — ABNORMAL HIGH (ref 8–23)
BUN: 51 mg/dL — ABNORMAL HIGH (ref 8–23)
CO2: 23 mmol/L (ref 22–32)
CO2: 23 mmol/L (ref 22–32)
Calcium: 9.5 mg/dL (ref 8.9–10.3)
Calcium: 9 mg/dL (ref 8.9–10.3)
Chloride: 106 mmol/L (ref 98–111)
Chloride: 102 mmol/L (ref 98–111)
Creatinine, Ser: 2.07 mg/dL — ABNORMAL HIGH (ref 0.44–1.00)
Creatinine, Ser: 2.01 mg/dL — ABNORMAL HIGH (ref 0.44–1.00)
GFR, Estimated: 25 mL/min — ABNORMAL LOW
GFR, Estimated: 26 mL/min — ABNORMAL LOW
Glucose, Bld: 94 mg/dL (ref 70–99)
Glucose, Bld: 113 mg/dL — ABNORMAL HIGH (ref 70–99)
Potassium: 4.4 mmol/L (ref 3.5–5.1)
Potassium: 4.4 mmol/L (ref 3.5–5.1)
Sodium: 147 mmol/L — ABNORMAL HIGH (ref 135–145)
Sodium: 144 mmol/L (ref 135–145)

## 2024-10-30 LAB — CBC
HCT: 39.8 % (ref 36.0–46.0)
Hemoglobin: 12 g/dL (ref 12.0–15.0)
MCH: 25.9 pg — ABNORMAL LOW (ref 26.0–34.0)
MCHC: 30.2 g/dL (ref 30.0–36.0)
MCV: 86 fL (ref 80.0–100.0)
Platelets: 250 10*3/uL (ref 150–400)
RBC: 4.63 MIL/uL (ref 3.87–5.11)
RDW: 18.2 % — ABNORMAL HIGH (ref 11.5–15.5)
WBC: 9 10*3/uL (ref 4.0–10.5)
nRBC: 1 % — ABNORMAL HIGH (ref 0.0–0.2)

## 2024-10-30 LAB — PRO BRAIN NATRIURETIC PEPTIDE: Pro Brain Natriuretic Peptide: 6158 pg/mL — ABNORMAL HIGH

## 2024-10-30 LAB — I-STAT CHEM 8, ED
BUN: 52 mg/dL — ABNORMAL HIGH (ref 8–23)
Calcium, Ion: 0.94 mmol/L — ABNORMAL LOW (ref 1.15–1.40)
Chloride: 111 mmol/L (ref 98–111)
Creatinine, Ser: 2.2 mg/dL — ABNORMAL HIGH (ref 0.44–1.00)
Glucose, Bld: 101 mg/dL — ABNORMAL HIGH (ref 70–99)
HCT: 39 % (ref 36.0–46.0)
Hemoglobin: 13.3 g/dL (ref 12.0–15.0)
Potassium: 4.2 mmol/L (ref 3.5–5.1)
Sodium: 144 mmol/L (ref 135–145)
TCO2: 21 mmol/L — ABNORMAL LOW (ref 22–32)

## 2024-10-30 LAB — HEPATIC FUNCTION PANEL
ALT: 24 U/L (ref 0–44)
AST: 36 U/L (ref 15–41)
Albumin: 3.9 g/dL (ref 3.5–5.0)
Alkaline Phosphatase: 179 U/L — ABNORMAL HIGH (ref 38–126)
Bilirubin, Direct: 0.4 mg/dL — ABNORMAL HIGH (ref 0.0–0.2)
Indirect Bilirubin: 0.5 mg/dL (ref 0.3–0.9)
Total Bilirubin: 0.9 mg/dL (ref 0.0–1.2)
Total Protein: 7 g/dL (ref 6.5–8.1)

## 2024-10-30 LAB — CBG MONITORING, ED
Glucose-Capillary: 96 mg/dL (ref 70–99)
Glucose-Capillary: 102 mg/dL — ABNORMAL HIGH (ref 70–99)
Glucose-Capillary: 118 mg/dL — ABNORMAL HIGH (ref 70–99)

## 2024-10-30 LAB — I-STAT CG4 LACTIC ACID, ED: Lactic Acid, Venous: 2.4 mmol/L (ref 0.5–1.9)

## 2024-10-30 MED ORDER — ATORVASTATIN CALCIUM 80 MG PO TABS
80.0000 mg | ORAL_TABLET | Freq: Every day | ORAL | Status: DC
  Administered 2024-10-30 – 2024-10-31 (×2): 80 mg via ORAL
  Filled 2024-10-30 (×2): qty 1

## 2024-10-30 MED ORDER — TRIMETHOBENZAMIDE HCL 100 MG/ML IM SOLN: 200.0000 mg | Freq: Three times a day (TID) | INTRAMUSCULAR | Status: DC | PRN

## 2024-10-30 MED ORDER — LORAZEPAM 2 MG/ML IJ SOLN
0.5000 mg | Freq: Once | INTRAMUSCULAR | Status: AC
  Administered 2024-10-30: 0.5 mg via INTRAVENOUS
  Filled 2024-10-30: qty 1

## 2024-10-30 MED ORDER — FUROSEMIDE 10 MG/ML IJ SOLN: 80.0000 mg | Freq: Four times a day (QID) | INTRAMUSCULAR | Status: DC

## 2024-10-30 MED ORDER — APIXABAN 5 MG PO TABS
5.0000 mg | ORAL_TABLET | Freq: Two times a day (BID) | ORAL | Status: DC
  Administered 2024-10-30 – 2024-11-01 (×5): 5 mg via ORAL
  Filled 2024-10-30 (×5): qty 1

## 2024-10-30 MED ORDER — ALBUTEROL SULFATE (2.5 MG/3ML) 0.083% IN NEBU: 2.5000 mg | INHALATION_SOLUTION | Freq: Four times a day (QID) | RESPIRATORY_TRACT | Status: DC | PRN

## 2024-10-30 MED ORDER — BISOPROLOL FUMARATE 5 MG PO TABS
2.5000 mg | ORAL_TABLET | Freq: Every day | ORAL | Status: DC
  Administered 2024-10-30 – 2024-11-01 (×3): 2.5 mg via ORAL
  Filled 2024-10-30: qty 0.5
  Filled 2024-10-30 (×2): qty 1

## 2024-10-30 MED ORDER — UMECLIDINIUM-VILANTEROL 62.5-25 MCG/ACT IN AEPB
1.0000 | INHALATION_SPRAY | Freq: Every day | RESPIRATORY_TRACT | Status: DC
  Administered 2024-10-30 – 2024-11-01 (×3): 1 via RESPIRATORY_TRACT
  Filled 2024-10-30: qty 14

## 2024-10-30 MED ORDER — INSULIN ASPART 100 UNIT/ML IJ SOLN
0.0000 [IU] | Freq: Three times a day (TID) | INTRAMUSCULAR | Status: DC
  Administered 2024-10-31: 2 [IU] via SUBCUTANEOUS
  Administered 2024-10-31 – 2024-11-01 (×2): 1 [IU] via SUBCUTANEOUS
  Filled 2024-10-30: qty 1
  Filled 2024-10-30: qty 2
  Filled 2024-10-30: qty 1

## 2024-10-30 MED ORDER — METOCLOPRAMIDE HCL 5 MG/ML IJ SOLN: 10.0000 mg | Freq: Once | INTRAMUSCULAR | Status: DC

## 2024-10-30 MED ORDER — ALBUTEROL SULFATE HFA 108 (90 BASE) MCG/ACT IN AERS
2.0000 | INHALATION_SPRAY | Freq: Once | RESPIRATORY_TRACT | Status: AC
  Administered 2024-10-30: 2 via RESPIRATORY_TRACT
  Filled 2024-10-30: qty 6.7

## 2024-10-30 MED ORDER — INSULIN GLARGINE-YFGN 100 UNIT/ML ~~LOC~~ SOLN
5.0000 [IU] | Freq: Every day | SUBCUTANEOUS | Status: DC
  Administered 2024-10-30 – 2024-10-31 (×2): 5 [IU] via SUBCUTANEOUS
  Filled 2024-10-30 (×3): qty 0.05

## 2024-10-30 MED ORDER — FUROSEMIDE 10 MG/ML IJ SOLN
80.0000 mg | Freq: Once | INTRAMUSCULAR | Status: AC
  Administered 2024-10-30: 80 mg via INTRAVENOUS
  Filled 2024-10-30: qty 8

## 2024-10-30 MED ORDER — TRIMETHOBENZAMIDE HCL 100 MG/ML IM SOLN
200.0000 mg | INTRAMUSCULAR | Status: AC
  Administered 2024-10-30: 200 mg via INTRAMUSCULAR
  Filled 2024-10-30: qty 2

## 2024-10-30 MED ORDER — ONDANSETRON HCL 4 MG/2ML IJ SOLN: 4.0000 mg | Freq: Once | INTRAMUSCULAR | Status: DC

## 2024-10-30 MED ORDER — ORAL CARE MOUTH RINSE: 15.0000 mL | OROMUCOSAL | Status: DC | PRN

## 2024-10-30 NOTE — ED Provider Notes (Signed)
 Accepted handoff at shift change from Groce PA-C. Please see prior provider note for more detail.   Briefly: Patient is 74 y.o. Patient presents to ED complaining of chest pain, shortness of breath, nausea and vomiting. Reports that her chest pain and shortness of breath began on Sunday. States that she chronically wears oxygen  at home at 2 L and has not increased this. Denies any swelling to her legs. Reports that she has been progressively more short of breath over the last few days and now is also having associated chest pressure which she describes as located centrally. She reports that she was able to smoke a cigarette on Tuesday despite her shortness of breath. She endorses multiple episodes of nausea and vomiting which also began on Tuesday. She denies abdominal pain, diarrhea. Denies fevers. Denies recent travel, antibiotics or new food exposures. Denies any dysuria or fevers at home. Denies blood in stool.    Plan:  - dispo pending lab workup and imaging. Unfortunately, labs were very difficult to obtain and this process was delayed until the IV team was able to finally obtain patient's labs. - patient feeling better after Tigan  injection, but did come back shortly after.  - CBC without leukocytosis or anemia.  BMP with mild hypernatremia at 147.  There is also an AKI with BUN/creatinine elevated at 51/2.07.  Hepatic function panel with slight elevation in ALP at 179.  BNP elevated at 6158. First 2 troponins were 167 and 152 but were time-stamped at the same time so unsure which one is the repeat trop - will obtain a 3rd trop.  CBG 96.  UA not concerning for infection.  Initial lactic acid elevated mildly at 2.4. - Lung auscultation with diffuse crackles.  Chest x-ray with mild pulmonary edema and small left pleural effusion.  EKG sinus rhythm.  Patient maintained on cardiac monitor. - I suspect viral GI illness causing patient to miss Torsemide  doses which the contributed to CHF exacerbation.  I believe admission would be in patient's best interest. I provided a dose of ativan  for recurrent nausea. Patient agreeable with plan for admission.  - Internal Medicine Teaching Service agrees to admit patient.  - Staffed with Dr. Laurice.      .Critical Care  Performed by: Hoy Nidia FALCON, PA-C Authorized by: Hoy Nidia FALCON, PA-C   Critical care provider statement:    Critical care time (minutes):  30   Critical care was time spent personally by me on the following activities:  Development of treatment plan with patient or surrogate, discussions with consultants, evaluation of patient's response to treatment, examination of patient, ordering and review of laboratory studies, ordering and review of radiographic studies, ordering and performing treatments and interventions, pulse oximetry, re-evaluation of patient's condition and review of old charts   Care discussed with: admitting provider   Comments:     CHF exacerbation and recurrent nausea with multiple re-evaluations     Hoy Nidia FALCON DEVONNA 10/30/24 1114    Laurice Maude BROCKS, MD 11/01/24 1413

## 2024-10-30 NOTE — ED Notes (Addendum)
 RN attempted to gain IV access and draw lab work. 2 unsuccessful attempts.

## 2024-10-30 NOTE — ED Notes (Signed)
 Unable to collect lab work at this time due to limited venous access. Internal medicine MD and Nidia Mays, PA notified.

## 2024-10-30 NOTE — ED Provider Notes (Signed)
 " [place] EMERGENCY DEPARTMENT AT [facility] Provider Note   CSN: [id number] Arrival date & time: 10/30/24  9462     Patient presents with: Emesis and Weakness   [name] is a 74 y.o. female with history of hypertension, diabetes, paroxysmal atrial flutter, CKD stage IV, COPD, tobacco use, diabetic gastroparesis, esophageal dysphagia, QT prolongation, CHF.  Patient presents to ED complaining of chest pain, shortness of breath, nausea and vomiting.  Reports that her chest pain and shortness of breath began on Sunday.  States that she chronically wears oxygen  at home at 2 L and has not increased this.  Denies any swelling to her legs.  Reports that she has been progressively more short of breath over the last few days and now is also having associated chest pressure which she describes as located centrally.  She reports that she was able to smoke a cigarette on Tuesday despite her shortness of breath.  She endorses multiple episodes of nausea and vomiting which also began on Tuesday.  She denies abdominal pain, diarrhea.  Denies fevers.  Denies recent travel, antibiotics or new food exposures.  Denies any dysuria or fevers at home.  Denies blood in stool.   Emesis Weakness Associated symptoms: vomiting        Prior to Admission medications  Medication Sig Start Date End Date Taking? Authorizing Provider  albuterol  (VENTOLIN  HFA) 108 (90 Base) MCG/ACT inhaler INHALE 2 PUFFS INTO THE LUNGS EVERY 6 HOURS AS NEEDED FOR WHEEZING OR SHORTNESS OF BREATH 08/22/24   Amoako, Prince, MD  apixaban  (ELIQUIS ) 5 MG TABS tablet Take 1 tablet (5 mg total) by mouth 2 (two) times daily. 07/07/24   Charmayne Holmes, DO  atorvastatin  (LIPITOR ) 80 MG tablet Take 1 tablet (80 mg total) by mouth at bedtime. 09/30/24   Zheng, Michael, DO  bisoprolol  (ZEBETA ) 5 MG tablet Take 0.5 tablets (2.5 mg total) by mouth daily. 06/06/24   Charmayne Holmes, DO  Continuous Glucose Sensor (DEXCOM G7 SENSOR) MISC Change  sensor every 10 days 07/04/24   Charmayne Holmes, DO  dapagliflozin  propanediol (FARXIGA ) 10 MG TABS tablet Take 1 tablet (10 mg total) by mouth daily before breakfast. 10/01/24   Zheng, Michael, DO  diclofenac  Sodium (VOLTAREN ) 1 % GEL Apply 4 g topically 4 (four) times daily. 09/14/24   D'Mello, Rosalyn, DO  insulin  degludec (TRESIBA ) 100 UNIT/ML FlexTouch Pen Inject 5 Units into the skin daily. 04/17/24   D'Mello, Rosalyn, DO  ketoconazole  (NIZORAL ) 2 % shampoo Apply 1 Application topically once a week. 10/16/23   [provider]  levocetirizine (XYZAL ) 5 MG tablet Take 1 tablet (5 mg total) by mouth at bedtime. Patient taking differently: Take 5 mg by mouth at bedtime as needed for allergies. 03/25/24   Zheng, Michael, DO  OXYGEN  Inhale 2 L into the lungs at bedtime.    [provider]  Semaglutide , 2 MG/DOSE, (OZEMPIC , 2 MG/DOSE,) 8 MG/3ML SOPN Inject 2 mg into the skin once a week. 06/19/24   Charmayne Holmes, DO  spironolactone  (ALDACTONE ) 25 MG tablet Take 0.5 tablets (12.5 mg total) by mouth daily. 06/05/24   Charmayne Holmes, DO  torsemide  (DEMADEX ) 20 MG tablet Take 1 tablet (20 mg total) by mouth every Monday, Wednesday, and Friday. 09/30/24   Zheng, Michael, DO  triamcinolone  ointment (KENALOG ) 0.1 % APPLY TOPICALLY TWICE DAILY MIX WITH EUCERIN 08/19/24   Amoako, Prince, MD  umeclidinium-vilanterol (ANORO ELLIPTA ) 62.5-25 MCG/ACT AEPB Inhale 1 puff into the lungs daily. 06/05/24  Charmayne Holmes, DO    Allergies: Actos [pioglitazone], Naproxen , Rosiglitazone, Metformin  and related, Tomato, Hydrocodone -acetaminophen , and Hydrocortisone    Review of Systems  Gastrointestinal:  Positive for vomiting.  Neurological:  Positive for weakness.  All other systems reviewed and are negative.   Updated Vital Signs BP (!) 148/85   Pulse 88   Temp 97.8 F (36.6 C) (Oral)   Resp 15   Ht 5' 5 (1.651 m)   Wt 79.8 kg   SpO2 100%   BMI 29.29 kg/m   Physical Exam Vitals and nursing  note reviewed.  Constitutional:      General: She is not in acute distress.    Appearance: She is well-developed.  HENT:     Head: Normocephalic and atraumatic.  Eyes:     Conjunctiva/sclera: Conjunctivae normal.  Cardiovascular:     Rate and Rhythm: Normal rate and regular rhythm.     Heart sounds: No murmur heard. Pulmonary:     Effort: Pulmonary effort is normal. No respiratory distress.     Breath sounds: Normal breath sounds.  Abdominal:     Palpations: Abdomen is soft.     Tenderness: There is no abdominal tenderness.  Musculoskeletal:        General: No swelling.     Cervical back: Neck supple.     Right lower leg: No edema.     Left lower leg: No edema.  Skin:    General: Skin is warm and dry.     Capillary Refill: Capillary refill takes less than 2 seconds.  Neurological:     Mental Status: She is alert and oriented to person, place, and time. Mental status is at baseline.  Psychiatric:        Mood and Affect: Mood normal.     (all labs ordered are listed, but only abnormal results are displayed) Labs Reviewed  BASIC METABOLIC PANEL WITH GFR  CBC  HEPATIC FUNCTION PANEL  PRO BRAIN NATRIURETIC PEPTIDE  URINALYSIS, ROUTINE W REFLEX MICROSCOPIC  CBG MONITORING, ED  I-STAT CG4 LACTIC ACID, ED  TROPONIN T, HIGH SENSITIVITY    EKG: None  Radiology: DG Chest 2 View Result Date: 10/30/2024 EXAM: 2 VIEW(S) XRAY OF THE CHEST 10/30/2024 06:14:00 AM COMPARISON: 09/11/2024 CLINICAL HISTORY: Chest pain. FINDINGS: LINES, TUBES AND DEVICES: Left chest AICD with distal tip in right ventricle. LUNGS AND PLEURA: Bilateral interstitial opacities, likely due to pulmonary edema. Small left pleural effusion. No pneumothorax. HEART AND MEDIASTINUM: Moderate cardiomegaly. Aortic atherosclerosis. BONES AND SOFT TISSUES: Multilevel thoracic osteophytosis. IMPRESSION: 1. Mild pulmonary edema and small left pleural effusion. 2. Moderate cardiomegaly. Electronically signed by: Waddell Calk MD 10/30/2024 06:24 AM EST RP Workstation: [id number]    Procedures   Medications Ordered in the ED  albuterol  (VENTOLIN  HFA) 108 (90 Base) MCG/ACT inhaler 2 puff (has no administration in time range)  trimethobenzamide  (TIGAN ) injection 200 mg (has no administration in time range)    Medical Decision Making Amount and/or Complexity of Data Reviewed Labs: ordered. Radiology: ordered.  Risk Prescription drug management.   74 year old female presents for evaluation.  On exam HD stable.  Lung sounds clear bilaterally, no hypoxia.  Abdomen soft and compressible.  Neurological examination at baseline.  No edema to bilateral lower extremities.  Chest x-ray collected showing pleural effusions and cardiomegaly as well as mild pulmonary edema.  EKG nonischemic.  CBG 96  At end of shift, patient workup not complete.  Pending imaging and laboratory evaluation.  Signed out to oncoming provider Hoy  PA-C.  Plan of management discussed.   Final diagnoses:  Weakness  Shortness of breath  Nausea and vomiting, unspecified vomiting type    ED Discharge Orders     None          Ruthell Lonni FALCON, PA-C 10/30/24 [id number]    Theadore Ozell HERO, MD 10/30/24 [phone]  "

## 2024-10-30 NOTE — ED Triage Notes (Signed)
 Pt bib ems from home c/o emesis  and generalized weakness for one week.   BP 152/80 HR 100  2L 100% RR 18 CBG 117

## 2024-10-30 NOTE — H&P (Signed)
 " Date: 10/30/2024               Patient Name:  [name] MRN: [id number]  DOB: 11/14/1950 Age / Sex: 74 y.o., female   PCP: [name], MD         Medical Service: Internal Medicine Teaching Service         Attending Physician: Dr. Eben Reyes BROCKS, MD      First Contact: Dr. Doyal Miyamoto, MD    Second Contact: Dr. Lonni Africa, DO         After Hours (After 5p/  First Contact Pager: [phone]  weekends / holidays): Second Contact Pager: [phone]   SUBJECTIVE   Chief Complaint: chest pain, nausea, and vomiting  History of Present Illness:  [name] is a 74 year old female with a past medical history of HFrEF with ICD, CKD stage IIIb, COPD, chronic hypoxic respiratory failure on 2 L of supplemental oxygen , hypertension, type 2 diabetes, and A-fib on Eliquis  who presents with 2 days of nausea, vomiting, and 1 day of chest pain.  She has had a couple bouts of nausea over the past year and does not know any specific trigger.  She denies any changes in diet, sick contacts, changes in medicines.  She does take a GLP-1 agonist on Tuesdays and her last dose was yesterday.  She denies any worsening of her symptoms after taking the GLP-1 agonist.  She has been throwing up 5-10 times a day over the past 2 days.  She describes her chest pain as bilateral in the upper chest, sharp, associated with sweating and presyncopal symptoms.  Pain is worsened with vomiting but denies any associated shortness of breath, palpitations, abdominal pain, changes in urinary habits, changes in bowel habits.  She was last able to take her medicines yesterday morning.  Her chest pain is present during my evaluation.  ED Course: Presented as above mildly hypertensive with blood pressure 148/85 but otherwise vital signs were normal and she is satting well on her home 2 L of supplemental oxygen  through nasal cannula.  There was some difficulties drawing labs on her but her metabolic panel showed creatinine  elevated at 2.07 with a BUN of 51, sodium 147, bicarb of 23, anion gap of 18, and alkaline phosphatase of 179.  CBC without any significant abnormalities.  Lactic acid 2.4.  proBNP 6100 and troponins drawn at the same time of 152 and 167.  Urinalysis does not show any significant signs of UTI but she does have some ketones, protein, and hyaline casts in her urine.  Chest x-ray showed mild pulmonary edema and a small left pleural effusion.  Past Medical History HFrEF with ICD CKD stage IIIb COPD Chronic hypoxic respiratory failure on 2 L at baseline Hypertension Type 2 diabetes A-fib on Eliquis   Meds:  Albuterol  inhaler as needed Eliquis  5 mg twice daily Atorvastatin  80 mg daily Bisoprolol  2.5 mg daily Farxiga  10 mg daily Tresiba  5 units daily Levocetirizine 5 mg daily Ozempic  2 mg weekly, last dose 10/29/2024 Spironolactone  12.5 mg daily Torsemide  20 mg every Monday/Wednesday/Friday Anoro Ellipta  1 puff daily  Past Surgical History Past Surgical History:  Procedure Laterality Date   ABDOMINAL HYSTERECTOMY     CARDIAC CATHETERIZATION     CARDIAC CATHETERIZATION N/A 06/21/2016   Procedure: Left Heart Cath and Coronary Angiography;  Surgeon: Ozell Fell, MD;  Location: [facility] INVASIVE CV LAB;  Service: Cardiovascular;  Laterality: N/A;   CARDIAC DEFIBRILLATOR PLACEMENT  05/04/2007   SJM Atlas  II VR ICD   CARDIAC DEFIBRILLATOR PLACEMENT     CATARACT EXTRACTION     od   COLONOSCOPY N/A 04/12/2013   Procedure: COLONOSCOPY;  Surgeon: Belvie JONETTA Just, MD;  Location: [facility] ENDOSCOPY;  Service: Endoscopy;  Laterality: N/A;  pt.has defibrilator   COLONOSCOPY N/A 04/16/2015   Procedure: COLONOSCOPY;  Surgeon: Toribio SHAUNNA Cedar, MD;  Location: [facility] ENDOSCOPY;  Service: Endoscopy;  Laterality: N/A;   COLONOSCOPY WITH PROPOFOL  N/A 07/21/2023   Procedure: COLONOSCOPY WITH PROPOFOL ;  Surgeon: Just Belvie, MD;  Location: [facility] ENDOSCOPY;  Service: Gastroenterology;  Laterality: N/A;    ESOPHAGOGASTRODUODENOSCOPY N/A 04/16/2015   Procedure: ESOPHAGOGASTRODUODENOSCOPY (EGD);  Surgeon: Toribio SHAUNNA Cedar, MD;  Location: [facility];  Service: Endoscopy;  Laterality: N/A;   ESOPHAGOGASTRODUODENOSCOPY (EGD) WITH PROPOFOL  N/A 11/25/2022   Procedure: ESOPHAGOGASTRODUODENOSCOPY (EGD) WITH PROPOFOL ;  Surgeon: Just Belvie, MD;  Location: [facility] ENDOSCOPY;  Service: Gastroenterology;  Laterality: N/A;   EYE SURGERY     HERNIA REPAIR     HYSTEROSCOPY     IMPLANTABLE CARDIOVERTER DEFIBRILLATOR (ICD) GENERATOR CHANGE N/A 04/02/2014   Procedure: ICD GENERATOR CHANGE;  Surgeon: Danelle LELON Birmingham, MD;  Location: [facility] CATH LAB;  Service: Cardiovascular;  Laterality: N/A;   INSERT / REPLACE / REMOVE PACEMAKER     LEFT HEART CATHETERIZATION WITH CORONARY ANGIOGRAM N/A 04/02/2012   Procedure: LEFT HEART CATHETERIZATION WITH CORONARY ANGIOGRAM;  Surgeon: Debby JONETTA Como, MD;  Location: [facility] CATH LAB;  Service: Cardiovascular;  Laterality: N/A;   ORIF ANKLE FRACTURE Right 08/12/2019   Procedure: OPEN REDUCTION INTERNAL FIXATION (ORIF) RIGHT TRIMALLEOLAR ANKLE FRACTURE;  Surgeon: Jerri Kay HERO, MD;  Location: [facility] OR;  Service: Orthopedics;  Laterality: Right;   RIGHT HEART CATH N/A 08/03/2022   Procedure: RIGHT HEART CATH;  Surgeon: Cherrie Toribio SAUNDERS, MD;  Location: [facility] INVASIVE CV LAB;  Service: Cardiovascular;  Laterality: N/A;   SAVORY DILATION N/A 11/25/2022   Procedure: SAVORY DILATION;  Surgeon: Just Belvie, MD;  Location: [facility] ENDOSCOPY;  Service: Gastroenterology;  Laterality: N/A;   TEE WITHOUT CARDIOVERSION N/A 06/23/2022   Procedure: TRANSESOPHAGEAL ECHOCARDIOGRAM (TEE);  Surgeon: Loni Soyla LABOR, MD;  Location: [facility] ENDOSCOPY;  Service: Cardiology;  Laterality: N/A;   TONSILLECTOMY     TUBAL LIGATION      Social:  Lives alone.  Ambulates with walker.  Independent in ADLs and IADLs. PCP: [name], MD Substances:25-pack-year history, 1/2 pack/day and quit 10/26/2024.  Denies alcohol or drug  use.  Family History:  Family History  Problem Relation Age of Onset   Stroke Mother    Seizures Father    Heart disease Father    Diabetes Sister    Asthma Maternal Aunt        aunts   Asthma Maternal Uncle        uncles   Heart disease Paternal Aunt        aunts   Heart disease Paternal Uncle        uncles   Heart disease Maternal Aunt        aunts   Heart disease Maternal Uncle        uncles   Heart disease Maternal Grandfather    Breast cancer Daughter    Breast cancer Cousin    Asthma Grandchild    Colon cancer Neg Hx    Colon polyps Neg Hx    Esophageal cancer Neg Hx    Kidney disease Neg Hx    Gallbladder disease Neg Hx     Allergies: Allergies as  of 10/30/2024 - Review Complete 10/30/2024  Allergen Reaction Noted   Actos [pioglitazone] Other (See Comments) 03/12/2009   Naproxen  Other (See Comments) 02/05/2016   Metformin  and related Other (See Comments) 04/18/2023   Nsaids Other (See Comments) 10/30/2024   Rosiglitazone Hives 02/11/2016   Hydrocodone -acetaminophen  Itching 08/08/2019   Hydrocortisone Hives and Itching 10/06/2009   Tomato Itching 06/16/2021    Review of Systems: A complete ROS was negative except as per HPI.   OBJECTIVE:   Physical Exam: Blood pressure (!) 155/80, pulse 92, temperature 97.8 F (36.6 C), temperature source Oral, resp. rate (!) 22, height 5' 5 (1.651 m), weight 79.8 kg, SpO2 97%.  Constitutional: Tired appearing elderly female laying in bed. In no acute distress. HENT: Normocephalic, atraumatic,  Eyes: Sclera non-icteric, PERRL, EOM intact Neck: JVD to the mid neck Cardio: Regular rate and rhythm. 2+ bilateral radial and dorsalis pedis  pulses.  All 4 extremities warm and well-perfused. Pulm: Mild wheezing in the bases bilaterally. Normal work of breathing on 2 L of supplemental oxygen . Abdomen: Soft, non-tender, non-distended, positive bowel sounds. MSK: Trace pitting edema in the lower extremities  bilaterally Skin:Warm and dry. Neuro:Alert and oriented x3. No focal deficit noted. Psych:Pleasant mood and affect.  Labs: CBC    Component Value Date/Time   WBC 9.0 10/30/2024 0834   RBC 4.63 10/30/2024 0834   HGB 13.3 10/30/2024 0906   HGB 14.1 02/08/2017 1551   HCT 39.0 10/30/2024 0906   HCT 44.5 02/08/2017 1551   PLT 250 10/30/2024 0834   PLT 246 02/08/2017 1551   MCV 86.0 10/30/2024 0834   MCV 87 02/08/2017 1551   MCH 25.9 (L) 10/30/2024 0834   MCHC 30.2 10/30/2024 0834   RDW 18.2 (H) 10/30/2024 0834   RDW 16.7 (H) 02/08/2017 1551   LYMPHSABS 1.2 09/11/2024 1745   LYMPHSABS 2.6 02/08/2017 1551   MONOABS 0.8 09/11/2024 1745   EOSABS 0.2 09/11/2024 1745   EOSABS 0.3 02/08/2017 1551   BASOSABS 0.1 09/11/2024 1745   BASOSABS 0.0 02/08/2017 1551     CMP     Component Value Date/Time   NA 144 10/30/2024 0906   NA 141 09/30/2024 1129   K 4.2 10/30/2024 0906   CL 111 10/30/2024 0906   CO2 23 10/30/2024 0834   GLUCOSE 101 (H) 10/30/2024 0906   BUN 52 (H) 10/30/2024 0906   BUN 29 (H) 09/30/2024 1129   CREATININE 2.20 (H) 10/30/2024 0906   CREATININE 0.88 06/16/2016 1338   CALCIUM  9.5 10/30/2024 0834   PROT 7.0 10/30/2024 0834   PROT 6.9 02/08/2017 1551   ALBUMIN  3.9 10/30/2024 0834   ALBUMIN  4.1 02/08/2017 1551   AST 36 10/30/2024 0834   ALT 24 10/30/2024 0834   ALKPHOS 179 (H) 10/30/2024 0834   BILITOT 0.9 10/30/2024 0834   BILITOT 0.4 02/08/2017 1551   GFRNONAA 25 (L) 10/30/2024 0834   GFRNONAA 75 02/13/2015 1015   GFRAA 78 07/14/2020 1536   GFRAA 87 02/13/2015 1015    Imaging: DG Chest 2 View Result Date: 10/30/2024 EXAM: 2 VIEW(S) XRAY OF THE CHEST 10/30/2024 06:14:00 AM COMPARISON: 09/11/2024 CLINICAL HISTORY: Chest pain. FINDINGS: LINES, TUBES AND DEVICES: Left chest AICD with distal tip in right ventricle. LUNGS AND PLEURA: Bilateral interstitial opacities, likely due to pulmonary edema. Small left pleural effusion. No pneumothorax. HEART AND  MEDIASTINUM: Moderate cardiomegaly. Aortic atherosclerosis. BONES AND SOFT TISSUES: Multilevel thoracic osteophytosis. IMPRESSION: 1. Mild pulmonary edema and small left pleural effusion. 2. Moderate cardiomegaly. Electronically signed by:  Waddell Calk MD 10/30/2024 06:24 AM EST RP Workstation: [id number]     EKG: personally reviewed my interpretation is sinus rhythm. Consistent with prior EKG.  ASSESSMENT & PLAN:   Assessment & Plan by Problem: Principal Problem:   Acute heart failure, unspecified heart failure type (HCC) Active Problems:   Tobacco use disorder   Essential hypertension   DM2 (diabetes mellitus, type 2) (HCC)   Nausea with vomiting   COPD (chronic obstructive pulmonary disease) (HCC)   QT prolongation   [name] is a 74 y.o. female with pertinent PMH of HFrEF with ICD, CKD stage IIIb, COPD, chronic hypoxic respiratory failure on 2 L of supplemental oxygen , hypertension, type 2 diabetes, and A-fib on Eliquis  who presented with chest pain, nausea, and vomiting and is admitted for acute heart failure exacerbation.  Acute HFrEF exacerbation Overall exacerbation is mild without increased oxygen  requirements, signs of poor perfusion, or severe dyspnea.  Most likely due to stress of nausea and vomiting and inability to take her torsemide .  Supporting evidence of increased proBNP, JVD, increased weight from baseline.  We will trial a dose of Lasix  and reassess. - Lasix  80 mg IV once - Strict ins and outs - Continue bisoprolol  2.5 mg daily - Holding spironolactone  and dapagliflozin  with AKI, add back if improving after diuresis  Chest pain Chest pain may be due from recurrent vomiting but concerning that it is associated with subjective diaphoresis and presyncope.  Troponin slightly elevated at 152/167.  Unfortunately it is very difficult to get labs on her so we are still waiting on a repeat troponin.  EKG is without any ischemic changes.  Chest pain is not worsening.  As  above no increased oxygen  demands. - Repeat troponin - Monitor EKG if unable to get troponin  AKI on CKD stage IIIb Most likely consistent with cardiorenal AKI however she has had nausea vomiting over the past 2 days.  Due to her supportive signs and symptoms as above we will trial diuresis and recheck a BMP in the afternoon. - Lasix  as above - Hold SGLT2 inhibitor and MRA with chance of prerenal AKI - Repeat BMP at 6 PM  Nausea and vomiting Prolonged QT Unsure of the cause of her nausea vomiting.  She has had diabetic gastroparesis documented in her chart however her only gastric emptying study available from 2018 was normal.  She does have a long history of uncontrolled diabetes but over the past year her diabetes has been better controlled with her last A1c 8.2 on 09/30/2024.  She is on a GLP-1 agonist and took her last dose yesterday with no worsening of her symptoms.  Low suspicion for gastroenteritis.  No marijuana use.  Her QT interval is 530 ms so if possible we will avoid Zofran .  She did get a dose of IV Ativan  but this made her very sleepy. - Tigan  200 mg every 8 hours as needed - Repeat EKG before trying another medicine if Tigan  is not working - Consider repeat gastric emptying study outpatient  Type 2 diabetes A1c 8.2 on 09/30/2024.  She is currently on a low-dose of long-acting insulin , SGLT2 inhibitor, and GLP-1 agonist outpatient.  As above it does not appear her GLP-1 agonist is triggering her nausea and vomiting but it is likely not helping.  We could trial a decreased dose of GLP-1. - Continue 5 units long-acting insulin  daily and sensitive sliding scale while admitted  COPD Chronic hypoxic respiratory failure Tobacco use disorder No signs of COPD  exacerbation with stable oxygen  requirements, very minimal  wheezing in the bases that is more consistent with pulmonary edema.  Continues on 2 L of supplemental oxygen .  Quit smoking this past week without medications or  nicotine  replacement. - Continue Anoro Ellipta  - Continue supplemental oxygen   Hypertension Blood pressure mildly elevated in the setting of not being able to take her blood pressure medicines and heart failure saturation. - Resume bisoprolol  2.5 mg daily - Hopeful to resume MRA tomorrow  A-fib on Eliquis  Normal sinus rhythm here.  No signs of bleeding. - Continue Eliquis  5 mg twice daily and bisoprolol  2.5 mg daily  Diet: Carb-Modified VTE: DOAC Code: Full  Dispo: Admit patient to Observation with expected length of stay less than 2 midnights.  Signed: Fairy Pool, DO Internal Medicine Resident, PGY-3 Please contact the on call pager at [phone] for any urgent or emergent needs. 1:33 PM 10/30/2024 "

## 2024-10-30 NOTE — Hospital Course (Addendum)
 #Acute HFrEF exacerbation #HFrEF (10/2024 Echo EF: <20%)  Presented following 2 days of nausea and vomiting. Found to have increased proBNP in the 6000s.Overall exacerbation was mild without increased oxygen  requirements, signs of poor perfusion, or severe dyspnea.  Most likely due to stress of nausea and vomiting and inability to take her torsemide .  Supporting evidence of increased proBNP, JVD, increased weight from baseline. She was given IV diuresis with improvement of daily weights toward her baseline dry weight as well as improvement of her renal function. Not yet able to add back GDMT given renal function < 30. Weight on day of discharge 171, down from 176 lbs on admission. She thinks her baseline dry weight is 168 lbs. She remained without increased oxygen  requirement from her home 2L [place]. She was monitored daily with labs, I/Os, and daily weights. Repeat Echo this admission showed EF < 20%, global hypokinesis, grade II diastolic dysfunction, tricuspid regurgitation, moderate mitral valve regurgitation, and severely dilated right and left atriums.  - GDMT: - Continue bisoprolol  2.5 mg daily - Continue Torsemide  20 mg daily  - Continue spironolactone  12.5 mg daily  - Hold Farxiga  10 mg daily - repeat BMP at f/u to determine if appropriate to resume the rest of her GDMT given renal function   #Chest pain, resolved Chest pain felt likely to be secondary to bouts of emesis on admission. Cardiac work up including EKG, telemetry and troponins negative for evidence of acute ischemia. Chest pain resolved by 1/30.    #AKI, improving  #CKD stage IIIb Most likely consistent with cardiorenal AKI however she had nausea vomiting for 2 days prior to admission. Cr improving 2.07 --> 1.90 following IV diuresis. Continued to diurese and held GDMT MRA and SGLT2-i given decreased renal function. Avoided nephrotoxins during admission. Resumed Spironolactone  on discharge.  - Repeat BMP at f/u to assess renal  function - Continue home spironolactone  12.5 mg daily  - Hold home Farxiga  10 mg daily    #Nausea and vomiting, resolved  #Prolonged QT Unsure of the cause of her nausea vomiting.  She has had diabetic gastroparesis documented in her chart however her only gastric emptying study available from 2018 was normal.  She does have a long history of uncontrolled diabetes but over the past year her diabetes has been better controlled with her last A1c 8.2 on 09/30/2024.  She is on Ozempic  2 mg weekly and took her last dose day prior to admission with no worsening of her symptoms.  Low suspicion for gastroenteritis.  No marijuana use.  Her QT interval was 530 ms so avoided Zofran  and given Tigan  instead.  She did get a dose of IV Ativan  but this made her very sleepy. By day of discharge nausea and vomiting had resolved, with no further episodes since the day of admission.  - Consider repeat gastric emptying study outpatient   #DMII A1c 8.2 on 09/30/2024.  She is currently on a low-dose of long-acting insulin  glargine 5 units nightly, Farxiga  10 mg daily, and Ozempic  2 mg weekly outpatient.  - Continue 5 units long-acting insulin  daily - Hold Farxiga  10 mg daily  - Continue home spironolactone  12.5 mg daily  - Resume home Ozempic  2 mg weekly on discharge   #COPD #Chronic hypoxic respiratory failure #Tobacco use disorder No signs of COPD exacerbation with stable oxygen  requirements, very minimal  wheezing in the bases that is more consistent with pulmonary edema.  Continues on home 2L [place] of supplemental oxygen .  Quit smoking this past  week without medications or nicotine  replacement. - Continue Anoro Ellipta  - Continue home supplemental oxygen  2L [place]    #Hypertension Blood pressure monitored daily.  - Continue home bisoprolol  2.5 mg daily   #A-fib on Eliquis  Normal sinus rhythm here.  No signs of bleeding. - Continue home Eliquis  5 mg twice daily  - Continue home bisoprolol  2.5 mg daily

## 2024-10-31 ENCOUNTER — Observation Stay (HOSPITAL_COMMUNITY)

## 2024-10-31 DIAGNOSIS — N179 Acute kidney failure, unspecified: Secondary | ICD-10-CM | POA: Diagnosis present

## 2024-10-31 DIAGNOSIS — R079 Chest pain, unspecified: Secondary | ICD-10-CM | POA: Diagnosis not present

## 2024-10-31 DIAGNOSIS — Z794 Long term (current) use of insulin: Secondary | ICD-10-CM | POA: Diagnosis not present

## 2024-10-31 DIAGNOSIS — Z833 Family history of diabetes mellitus: Secondary | ICD-10-CM | POA: Diagnosis not present

## 2024-10-31 DIAGNOSIS — E1143 Type 2 diabetes mellitus with diabetic autonomic (poly)neuropathy: Secondary | ICD-10-CM | POA: Diagnosis present

## 2024-10-31 DIAGNOSIS — I081 Rheumatic disorders of both mitral and tricuspid valves: Secondary | ICD-10-CM | POA: Diagnosis present

## 2024-10-31 DIAGNOSIS — E1165 Type 2 diabetes mellitus with hyperglycemia: Secondary | ICD-10-CM | POA: Diagnosis present

## 2024-10-31 DIAGNOSIS — E1122 Type 2 diabetes mellitus with diabetic chronic kidney disease: Secondary | ICD-10-CM | POA: Diagnosis present

## 2024-10-31 DIAGNOSIS — F17210 Nicotine dependence, cigarettes, uncomplicated: Secondary | ICD-10-CM | POA: Diagnosis present

## 2024-10-31 DIAGNOSIS — E8721 Acute metabolic acidosis: Secondary | ICD-10-CM | POA: Diagnosis present

## 2024-10-31 DIAGNOSIS — Z7985 Long-term (current) use of injectable non-insulin antidiabetic drugs: Secondary | ICD-10-CM | POA: Diagnosis not present

## 2024-10-31 DIAGNOSIS — I4581 Long QT syndrome: Secondary | ICD-10-CM | POA: Diagnosis not present

## 2024-10-31 DIAGNOSIS — N1832 Chronic kidney disease, stage 3b: Secondary | ICD-10-CM | POA: Diagnosis present

## 2024-10-31 DIAGNOSIS — Z79899 Other long term (current) drug therapy: Secondary | ICD-10-CM | POA: Diagnosis not present

## 2024-10-31 DIAGNOSIS — Z7901 Long term (current) use of anticoagulants: Secondary | ICD-10-CM | POA: Diagnosis not present

## 2024-10-31 DIAGNOSIS — Z95810 Presence of automatic (implantable) cardiac defibrillator: Secondary | ICD-10-CM | POA: Diagnosis not present

## 2024-10-31 DIAGNOSIS — F17200 Nicotine dependence, unspecified, uncomplicated: Secondary | ICD-10-CM | POA: Diagnosis not present

## 2024-10-31 DIAGNOSIS — R0602 Shortness of breath: Secondary | ICD-10-CM | POA: Diagnosis present

## 2024-10-31 DIAGNOSIS — J449 Chronic obstructive pulmonary disease, unspecified: Secondary | ICD-10-CM | POA: Diagnosis present

## 2024-10-31 DIAGNOSIS — R112 Nausea with vomiting, unspecified: Secondary | ICD-10-CM | POA: Diagnosis not present

## 2024-10-31 DIAGNOSIS — Z8249 Family history of ischemic heart disease and other diseases of the circulatory system: Secondary | ICD-10-CM | POA: Diagnosis not present

## 2024-10-31 DIAGNOSIS — I482 Chronic atrial fibrillation, unspecified: Secondary | ICD-10-CM | POA: Diagnosis present

## 2024-10-31 DIAGNOSIS — Z888 Allergy status to other drugs, medicaments and biological substances status: Secondary | ICD-10-CM | POA: Diagnosis not present

## 2024-10-31 DIAGNOSIS — I4891 Unspecified atrial fibrillation: Secondary | ICD-10-CM | POA: Diagnosis present

## 2024-10-31 DIAGNOSIS — E870 Hyperosmolality and hypernatremia: Secondary | ICD-10-CM | POA: Diagnosis present

## 2024-10-31 DIAGNOSIS — Z886 Allergy status to analgesic agent status: Secondary | ICD-10-CM | POA: Diagnosis not present

## 2024-10-31 DIAGNOSIS — J9611 Chronic respiratory failure with hypoxia: Secondary | ICD-10-CM | POA: Diagnosis present

## 2024-10-31 DIAGNOSIS — I130 Hypertensive heart and chronic kidney disease with heart failure and stage 1 through stage 4 chronic kidney disease, or unspecified chronic kidney disease: Secondary | ICD-10-CM | POA: Diagnosis present

## 2024-10-31 DIAGNOSIS — I5021 Acute systolic (congestive) heart failure: Secondary | ICD-10-CM | POA: Diagnosis present

## 2024-10-31 LAB — BASIC METABOLIC PANEL WITH GFR
Anion gap: 15 (ref 5–15)
BUN: 50 mg/dL — ABNORMAL HIGH (ref 8–23)
CO2: 25 mmol/L (ref 22–32)
Calcium: 9.3 mg/dL (ref 8.9–10.3)
Chloride: 104 mmol/L (ref 98–111)
Creatinine, Ser: 1.94 mg/dL — ABNORMAL HIGH (ref 0.44–1.00)
GFR, Estimated: 27 mL/min — ABNORMAL LOW
Glucose, Bld: 97 mg/dL (ref 70–99)
Potassium: 4.3 mmol/L (ref 3.5–5.1)
Sodium: 144 mmol/L (ref 135–145)

## 2024-10-31 LAB — GLUCOSE, CAPILLARY
Glucose-Capillary: 99 mg/dL (ref 70–99)
Glucose-Capillary: 95 mg/dL (ref 70–99)
Glucose-Capillary: 148 mg/dL — ABNORMAL HIGH (ref 70–99)
Glucose-Capillary: 187 mg/dL — ABNORMAL HIGH (ref 70–99)
Glucose-Capillary: 112 mg/dL — ABNORMAL HIGH (ref 70–99)

## 2024-10-31 LAB — CBC
HCT: 41.6 % (ref 36.0–46.0)
Hemoglobin: 13 g/dL (ref 12.0–15.0)
MCH: 26 pg (ref 26.0–34.0)
MCHC: 31.3 g/dL (ref 30.0–36.0)
MCV: 83.2 fL (ref 80.0–100.0)
Platelets: 295 10*3/uL (ref 150–400)
RBC: 5 MIL/uL (ref 3.87–5.11)
RDW: 18.6 % — ABNORMAL HIGH (ref 11.5–15.5)
WBC: 8.6 10*3/uL (ref 4.0–10.5)
nRBC: 3.9 % — ABNORMAL HIGH (ref 0.0–0.2)

## 2024-10-31 LAB — ECHOCARDIOGRAM COMPLETE
Area-P 1/2: 4.21 cm2
Est EF: <20
Height: 65 in
S' Lateral: 5.1 cm
Weight: 2761.92 [oz_av]

## 2024-10-31 LAB — MAGNESIUM: Magnesium: 2.1 mg/dL (ref 1.7–2.4)

## 2024-10-31 MED ORDER — PERFLUTREN LIPID MICROSPHERE
1.0000 mL | INTRAVENOUS | Status: AC | PRN
  Administered 2024-10-31: 3 mL via INTRAVENOUS

## 2024-10-31 MED ORDER — FUROSEMIDE 10 MG/ML IJ SOLN
80.0000 mg | Freq: Once | INTRAMUSCULAR | Status: AC
  Administered 2024-10-31: 80 mg via INTRAVENOUS
  Filled 2024-10-31: qty 8

## 2024-10-31 MED ORDER — TORSEMIDE 20 MG PO TABS
20.0000 mg | ORAL_TABLET | Freq: Every day | ORAL | Status: DC
  Administered 2024-11-01: 20 mg via ORAL
  Filled 2024-10-31: qty 1

## 2024-10-31 MED ORDER — ENSURE PLUS HIGH PROTEIN PO LIQD
237.0000 mL | Freq: Two times a day (BID) | ORAL | Status: DC
  Administered 2024-10-31 (×2): 237 mL via ORAL

## 2024-10-31 MED ORDER — BISMUTH SUBSALICYLATE 262 MG/15ML PO SUSP
30.0000 mL | ORAL | Status: DC | PRN
  Administered 2024-10-31: 30 mL via ORAL
  Filled 2024-10-31: qty 236

## 2024-10-31 NOTE — Care Management Obs Status (Signed)
 MEDICARE OBSERVATION STATUS NOTIFICATION   Patient Details  Name: [name] MRN: [id number] Date of Birth: 29-Sep-1951   Medicare Observation Status Notification Given:  Yes    Vonzell Arrie Sharps 10/31/2024, 9:27 AM

## 2024-10-31 NOTE — Progress Notes (Signed)
 Echocardiogram 2D Echocardiogram has been performed.  Juliene JINNY Rucks 10/31/2024, 5:27 PM

## 2024-10-31 NOTE — Progress Notes (Addendum)
 "  HD#0 SUBJECTIVE:  Patient Summary: [name] is a 74 y.o. female with pertinent PMH of HFrEF with ICD, CKD stage IIIb, COPD, chronic hypoxic respiratory failure on 2 L of supplemental oxygen , hypertension, type 2 diabetes, and A-fib on Eliquis  who presented with chest pain, nausea, and vomiting and is admitted for acute heart failure exacerbation.   Overnight Events:  None  Interim History:  Patient assessed bedside sitting on side of bed in no acute distress about to go to the restroom. Currently on 2 L. Denies nausea or vomiting today. Endorsed CP during episodes of nausea. Last emesis yesterday. Stated that breathing feels normal today. Stated that she does not feel like she's urinating well. Stated she doesn't feel like she's eating well.   OBJECTIVE:  Vital Signs: Vitals:   10/30/24 2310 10/31/24 0354 10/31/24 0821 10/31/24 0858  BP: (!) 140/93 (!) 143/92 123/84   Pulse: 82 83 88 88  Resp: 20 18 18 18   Temp: 97.9 F (36.6 C) 98.7 F (37.1 C) 97.7 F (36.5 C)   TempSrc: Oral Oral Oral   SpO2: 99% 100% 100%   Weight: 78.3 kg 78.3 kg    Height:       Supplemental O2: Nasal Cannula SpO2: 100 % O2 Flow Rate (L/min): 2 L/min FiO2 (%): 28 %  Filed Weights   10/30/24 0539 10/30/24 2310 10/31/24 0354  Weight: 79.8 kg 78.3 kg 78.3 kg     Intake/Output Summary (Last 24 hours) at 10/31/2024 1056 Last data filed at 10/31/2024 0900 Gross per 24 hour  Intake 240 ml  Output 400 ml  Net -160 ml   Net IO Since Admission: -160 mL [10/31/24 1056]  Physical Exam:  Constitutional: well-appearing female sitting on edge of hospital bed, in no acute distress HEENT: normocephalic atraumatic, mucous membranes moist Cardiovascular: regular rate and rhythm, bilateral radial pulses 2+, bilateral dorsal pedal pulses 2+, brisk capillary refill bilateral feet and hands, JVD present,  Pulmonary/Chest: normal work of breathing on home 2L [place], lungs generally clear to auscultation bilaterally  with diminished sounds Abdominal: soft, non-tender, non-distended MSK: normal bulk and tone. Neurological: alert & oriented x 3 Skin: warm and dry Psych: mood calm, behavior normal, thought content normal, judgement normal    Patient Lines/Drains/Airways Status     Active Line/Drains/Airways     Name Placement date Placement time Site Days   Peripheral IV 10/30/24 20 G 1.88 Anterior;Left;Upper Arm 10/30/24  0902  Arm  1            Pertinent labs and imaging:      Latest Ref Rng & Units 10/31/2024    2:26 AM 10/30/2024    9:06 AM 10/30/2024    8:34 AM  CBC  WBC 4.0 - 10.5 K/uL 8.6   9.0   Hemoglobin 12.0 - 15.0 g/dL 86.9  86.6  87.9   Hematocrit 36.0 - 46.0 % 41.6  39.0  39.8   Platelets 150 - 400 K/uL 295   250        Latest Ref Rng & Units 10/31/2024    2:26 AM 10/30/2024   10:21 PM 10/30/2024    9:06 AM  CMP  Glucose 70 - 99 mg/dL 97  886  898   BUN 8 - 23 mg/dL 50  51  52   Creatinine 0.44 - 1.00 mg/dL 8.05  7.98  7.79   Sodium 135 - 145 mmol/L 144  144  144   Potassium 3.5 - 5.1 mmol/L  4.3  4.4  4.2   Chloride 98 - 111 mmol/L 104  102  111   CO2 22 - 32 mmol/L 25  23    Calcium  8.9 - 10.3 mg/dL 9.3  9.0      No results found.  ASSESSMENT/PLAN:  Assessment: Principal Problem:   Acute heart failure, unspecified heart failure type (HCC) Active Problems:   Tobacco use disorder   Essential hypertension   DM2 (diabetes mellitus, type 2) (HCC)   Nausea with vomiting   COPD (chronic obstructive pulmonary disease) (HCC)   QT prolongation   Plan: #Acute HFrEF exacerbation #HFrEF (04/2024 Echo EF: 20-25%)  Overall exacerbation is mild without increased oxygen  requirements, signs of poor perfusion, or severe dyspnea.  Most likely due to stress of nausea and vomiting and inability to take her torsemide .  Supporting evidence of increased proBNP, JVD, increased weight from baseline. She is s/p one dose of IV Lasix  80 mg with Cr improving this morning. Not yet able  to add back GDMT given renal function < 30. Weight this AM of 172 lbs, down from 176 lbs on admission though not at baseline of 168 lbs. Will plan to give another dose of IV Lasix  80 mg today and assess thereafter. - s/p Lasix  80 mg IV x1 - IV Lasix  80 mg  - GDMT: - Continue bisoprolol  2.5 mg daily - Hold spironolactone  12.5 mg daily  - Hold Farxiga  10 mg daily  - Strict I/Os - Daily weights  - Keep K > 4, keep Mg > 2; replenish as needed    #Chest pain, improving Chest pain may be due from recurrent vomiting but concerning that it is associated with subjective diaphoresis and presyncope.  Troponin slightly elevated at 152/167.  x2 EKG is without any ischemic changes. Chest pain is not worsening.  As above no increased oxygen  demands.   #AKI #CKD stage IIIb Most likely consistent with cardiorenal AKI however she has had nausea vomiting over the past 2 days. Cr improving today 2.07 --> 1.94 following IV diuresis, so will continue to diurese today.  - Lasix  as above - Hold spironolactone  12.5 mg daily  - Hold Farxiga  10 mg daily  - Avoid nephrotoxins  - Preferred narcotic agents for pain control are hydromorphone , fentanyl , and methadone. Morphine  should not be used.  - Baclofen should be avoided - Avoid oral sodium phosphate  and magnesium  citrate based laxatives / bowel preps  - Avoid contrast - Avoid NSAIDs    #Nausea and vomiting #Prolonged QT Unsure of the cause of her nausea vomiting.  She has had diabetic gastroparesis documented in her chart however her only gastric emptying study available from 2018 was normal.  She does have a long history of uncontrolled diabetes but over the past year her diabetes has been better controlled with her last A1c 8.2 on 09/30/2024.  She is on Ozempic  2 mg weekly and took her last dose day prior to admission with no worsening of her symptoms.  Low suspicion for gastroenteritis.  No marijuana use.  Her QT interval is 530 ms so if possible we will avoid  Zofran .  She did get a dose of IV Ativan  but this made her very sleepy. - Tigan  200 mg every 8 hours as needed - Repeat EKG before trying another medicine if Tigan  is not working - Consider repeat gastric emptying study outpatient   #DMII A1c 8.2 on 09/30/2024.  She is currently on a low-dose of long-acting insulin  glargine 5 units nightly, Farxiga   10 mg daily, and Ozempic  2 mg weekly outpatient.  - Continue 5 units long-acting insulin  daily and sensitive sliding scale while admitted - Hold Farxiga  10 mg daily  - Hold spironolactone  12.5 mg daily  - Hold home Ozempic  2 mg weekly, resume outpatient once N/V has fully resolved   #COPD #Chronic hypoxic respiratory failure #Tobacco use disorder No signs of COPD exacerbation with stable oxygen  requirements, very minimal  wheezing in the bases that is more consistent with pulmonary edema.  Continues on home 2L [place] of supplemental oxygen .  Quit smoking this past week without medications or nicotine  replacement. - Continue Anoro Ellipta  - Continue supplemental oxygen    #Hypertension Blood pressure mildly elevated in the setting of not being able to take her blood pressure medicines and heart failure saturation. - Continue home bisoprolol  2.5 mg daily   #A-fib on Eliquis  Normal sinus rhythm here.  No signs of bleeding. - Continue home Eliquis  5 mg twice daily  - Continue home bisoprolol  2.5 mg daily  Best Practice: Diet: Diabetic diet VTE: DOAC Code: Full  Disposition planning: DISPO: Anticipated discharge 1-2 days to Home pending IV diuresis.  Signature:  Doyal Miyamoto, MD [name]  10:56 AM, 10/31/2024  On Call pager [phone]  "

## 2024-10-31 NOTE — Progress Notes (Signed)
 Heart Failure Navigator Progress Note  Assessed for Heart & Vascular TOC clinic readiness.  Patient does not meet criteria due to she is a Advanced Heart Failure Team patient of Dr. Bensimhon. .   Navigator will sign off at this time.   Stephane Haddock, BSN, Scientist, Clinical (histocompatibility And Immunogenetics) Only

## 2024-11-01 ENCOUNTER — Other Ambulatory Visit (HOSPITAL_COMMUNITY): Payer: Self-pay

## 2024-11-01 DIAGNOSIS — N179 Acute kidney failure, unspecified: Secondary | ICD-10-CM | POA: Diagnosis not present

## 2024-11-01 DIAGNOSIS — Z794 Long term (current) use of insulin: Secondary | ICD-10-CM | POA: Diagnosis not present

## 2024-11-01 DIAGNOSIS — Z7901 Long term (current) use of anticoagulants: Secondary | ICD-10-CM | POA: Diagnosis not present

## 2024-11-01 DIAGNOSIS — Z7985 Long-term (current) use of injectable non-insulin antidiabetic drugs: Secondary | ICD-10-CM | POA: Diagnosis not present

## 2024-11-01 DIAGNOSIS — F17200 Nicotine dependence, unspecified, uncomplicated: Secondary | ICD-10-CM | POA: Diagnosis not present

## 2024-11-01 DIAGNOSIS — J449 Chronic obstructive pulmonary disease, unspecified: Secondary | ICD-10-CM | POA: Diagnosis not present

## 2024-11-01 DIAGNOSIS — J9611 Chronic respiratory failure with hypoxia: Secondary | ICD-10-CM | POA: Diagnosis not present

## 2024-11-01 DIAGNOSIS — I130 Hypertensive heart and chronic kidney disease with heart failure and stage 1 through stage 4 chronic kidney disease, or unspecified chronic kidney disease: Secondary | ICD-10-CM | POA: Diagnosis not present

## 2024-11-01 DIAGNOSIS — E1122 Type 2 diabetes mellitus with diabetic chronic kidney disease: Secondary | ICD-10-CM | POA: Diagnosis not present

## 2024-11-01 DIAGNOSIS — N1832 Chronic kidney disease, stage 3b: Secondary | ICD-10-CM | POA: Diagnosis not present

## 2024-11-01 DIAGNOSIS — I5021 Acute systolic (congestive) heart failure: Secondary | ICD-10-CM | POA: Diagnosis not present

## 2024-11-01 DIAGNOSIS — I4891 Unspecified atrial fibrillation: Secondary | ICD-10-CM | POA: Diagnosis not present

## 2024-11-01 LAB — CBC
HCT: 39.4 % (ref 36.0–46.0)
Hemoglobin: 12.2 g/dL (ref 12.0–15.0)
MCH: 26 pg (ref 26.0–34.0)
MCHC: 31 g/dL (ref 30.0–36.0)
MCV: 84 fL (ref 80.0–100.0)
Platelets: 293 10*3/uL (ref 150–400)
RBC: 4.69 MIL/uL (ref 3.87–5.11)
RDW: 18.7 % — ABNORMAL HIGH (ref 11.5–15.5)
WBC: 10.3 10*3/uL (ref 4.0–10.5)
nRBC: 4.3 % — ABNORMAL HIGH (ref 0.0–0.2)

## 2024-11-01 LAB — BASIC METABOLIC PANEL WITH GFR
Anion gap: 15 (ref 5–15)
BUN: 52 mg/dL — ABNORMAL HIGH (ref 8–23)
CO2: 25 mmol/L (ref 22–32)
Calcium: 8.9 mg/dL (ref 8.9–10.3)
Chloride: 102 mmol/L (ref 98–111)
Creatinine, Ser: 1.9 mg/dL — ABNORMAL HIGH (ref 0.44–1.00)
GFR, Estimated: 28 mL/min — ABNORMAL LOW
Glucose, Bld: 160 mg/dL — ABNORMAL HIGH (ref 70–99)
Potassium: 3.6 mmol/L (ref 3.5–5.1)
Sodium: 142 mmol/L (ref 135–145)

## 2024-11-01 LAB — MAGNESIUM: Magnesium: 2.2 mg/dL (ref 1.7–2.4)

## 2024-11-01 LAB — GLUCOSE, CAPILLARY: Glucose-Capillary: 135 mg/dL — ABNORMAL HIGH (ref 70–99)

## 2024-11-01 MED ORDER — POTASSIUM CHLORIDE CRYS ER 20 MEQ PO TBCR
40.0000 meq | EXTENDED_RELEASE_TABLET | Freq: Once | ORAL | Status: AC
  Administered 2024-11-01: 40 meq via ORAL
  Filled 2024-11-01: qty 2

## 2024-11-01 MED ORDER — FUROSEMIDE 10 MG/ML IJ SOLN
80.0000 mg | Freq: Once | INTRAMUSCULAR | Status: AC
  Administered 2024-11-01: 80 mg via INTRAVENOUS
  Filled 2024-11-01: qty 8

## 2024-11-01 NOTE — Discharge Instructions (Signed)
 To [name] or their caretakers,  They were admitted to [facility] on 10/30/2024 for evaluation and treatment of: nausea, vomiting, and chest pain  The evaluation suggested you were having an acute heart failure exacerbation. You had a heart work up that did not show evidence of a heart attack. Your nausea and vomiting luckily improved on its own. You were treated with IV diuresis, electrolyte replenishment, and close monitoring.  They were discharged from the hospital on 11/01/24. I recommend the following after leaving the hospital:   Medications Adjusted During Hospital Admission:    1) DO NOT TAKE Farxiga  until instructed by your PCP    You may take all of your other medications as prescribed. You may resume Ozempic  weekly if your stomach issues have resolved.   Please attend your follow up appointments and make appointments as below:   Follow-up Information     Charmayne Holmes, DO. Go on 11/08/2024.   Specialty: Internal Medicine Why: Please go to your appointment with Dr. Charmayne in the Internal Medicine clinic on 11/08/2024 at 9:15 am. Please arrive 15 minutes early and bring all of your medications in their original bottles with you to this appointment. Contact information: [address] [place] [address] [phone]         Bensimhon, Toribio SAUNDERS, MD. Schedule an appointment as soon as possible for a visit.   Specialty: Cardiology Contact information: [address] [place] [address] [phone]                   For questions about your care plan, until you are able to see your primary doctor: Call [phone]. Dial [phone] for the operator. Ask for the internal medicine resident on call.  Thank you for allowing us  to be part of your care.   Doyal Miyamoto, MD 11/01/2024, 9:44 AM

## 2024-11-01 NOTE — Progress Notes (Signed)
 "  HD#1 SUBJECTIVE:  Patient Summary: [name] is a 74 y.o. female with pertinent PMH of HFrEF with ICD, CKD stage IIIb, COPD, chronic hypoxic respiratory failure on 2 L of supplemental oxygen , hypertension, type 2 diabetes, and A-fib on Eliquis  who presented with chest pain, nausea, and vomiting and is admitted for acute heart failure exacerbation.   Overnight Events:  None  Interim History:  Patient was assessed bedside after having eaten breakfast; laying comfortably in bed on 2 L of oxygen  in no acute distress. Overnight she had 5 episodes of diarrhea and patient stated that it was because she drank milk and that tends to cause diarrhea/loose stools. Since receiving pepto bismol she has not have any more diarrhea. Denies nausea or vomiting since admission. Denies CP or SOB. Stated that after receiving IV lasix  she thinks she peed well in the morning. Patient agreeable to additional lasix  dose this AM then going home. Patient only on home oxygen  at night.   OBJECTIVE:  Vital Signs: Vitals:   10/31/24 1620 10/31/24 1956 11/01/24 0011 11/01/24 0338  BP: (!) 141/93 (!) 140/91 126/80 122/84  Pulse: 79  76 75  Resp: 20 18 18 18   Temp: 97.8 F (36.6 C) 97.6 F (36.4 C) 98.7 F (37.1 C) 97.7 F (36.5 C)  TempSrc: Oral Oral Oral Oral  SpO2: 100%  100% 100%  Weight:    78 kg  Height:       Supplemental O2: Room Air SpO2: 100 % O2 Flow Rate (L/min): 2 L/min FiO2 (%): 28 %  Filed Weights   10/30/24 2310 10/31/24 0354 11/01/24 0338  Weight: 78.3 kg 78.3 kg 78 kg     Intake/Output Summary (Last 24 hours) at 11/01/2024 9340 Last data filed at 11/01/2024 0153 Gross per 24 hour  Intake 716 ml  Output 400 ml  Net 316 ml   Net IO Since Admission: -84 mL [11/01/24 0659]  Physical Exam:  Constitutional: well-appearing female sitting on edge of hospital bed, in no acute distress HEENT: normocephalic atraumatic, mucous membranes moist Cardiovascular: regular rate and rhythm,  bilateral radial pulses 2+, bilateral dorsal pedal pulses 2+, brisk capillary refill bilateral feet and hands Pulmonary/Chest: normal work of breathing on home 2L [place], lungs generally clear to auscultation bilaterally with improving sounds Abdominal: soft, non-tender, non-distended MSK: normal bulk and tone. Neurological: alert & oriented x 3 Skin: warm and dry Psych: mood calm, behavior normal, thought content normal, judgement normal    Patient Lines/Drains/Airways Status     Active Line/Drains/Airways     Name Placement date Placement time Site Days   Peripheral IV 10/30/24 20 G 1.88 Anterior;Left;Upper Arm 10/30/24  0902  Arm  2            Pertinent labs and imaging:      Latest Ref Rng & Units 11/01/2024    2:37 AM 10/31/2024    2:26 AM 10/30/2024    9:06 AM  CBC  WBC 4.0 - 10.5 K/uL 10.3  8.6    Hemoglobin 12.0 - 15.0 g/dL 87.7  86.9  86.6   Hematocrit 36.0 - 46.0 % 39.4  41.6  39.0   Platelets 150 - 400 K/uL 293  295         Latest Ref Rng & Units 11/01/2024    2:37 AM 10/31/2024    2:26 AM 10/30/2024   10:21 PM  CMP  Glucose 70 - 99 mg/dL 839  97  886   BUN 8 - 23  mg/dL 52  50  51   Creatinine 0.44 - 1.00 mg/dL 8.09  8.05  7.98   Sodium 135 - 145 mmol/L 142  144  144   Potassium 3.5 - 5.1 mmol/L 3.6  4.3  4.4   Chloride 98 - 111 mmol/L 102  104  102   CO2 22 - 32 mmol/L 25  25  23    Calcium  8.9 - 10.3 mg/dL 8.9  9.3  9.0     ECHOCARDIOGRAM COMPLETE Result Date: 10/31/2024    ECHOCARDIOGRAM REPORT   Patient Name:   JEM CASTRO Date of Exam: 10/31/2024 Medical Rec #:  [id number]    Height:       65.0 in Accession #:    [id number]   Weight:       172.6 lb Date of Birth:  11-01-1950   BSA:          1.858 m Patient Age:    73 years     BP:           141/93 mmHg Patient Gender: F            HR:           80 bpm. Exam Location:  Inpatient Procedure: 2D Echo, Cardiac Doppler, Color Doppler and Intracardiac            Opacification Agent (Both Spectral and Color Flow  Doppler were            utilized during procedure). Indications:    Chest Pain, Dyspnea  History:        Patient has prior history of Echocardiogram examinations, most                 recent 04/14/2024. CHF, Defibrillator, COPD, Signs/Symptoms:Chest                 Pain and Dyspnea; Risk Factors:Hypertension, Diabetes,                 Dyslipidemia and Current Smoker.  Sonographer:    Juliene Rucks Referring Phys: [id number] JEFFREY C HATCHER  Sonographer Comments: Patient is obese. Image acquisition challenging due to COPD. IMPRESSIONS  1. Left ventricular ejection fraction, by estimation, is <20%. The left ventricle has severely decreased function. The left ventricle demonstrates global hypokinesis. The left ventricular internal cavity size was mildly dilated. Left ventricular diastolic parameters are consistent with Grade II diastolic dysfunction (pseudonormalization). Elevated left atrial pressure. There is the interventricular septum is flattened in diastole ('D' shaped left ventricle), consistent with right ventricular volume overload.  2. Right ventricular systolic function is normal. The right ventricular size is normal. There is moderately elevated pulmonary artery systolic pressure. The estimated right ventricular systolic pressure is 53.7 mmHg.  3. Left atrial size was severely dilated.  4. Right atrial size was severely dilated.  5. The mitral valve is degenerative. Moderate mitral valve regurgitation. No evidence of mitral stenosis.  6. The aortic valve is bicuspid. Aortic valve regurgitation is trivial. Aortic valve sclerosis/calcification is present, without any evidence of aortic stenosis.  7. The inferior vena cava is dilated in size with <50% respiratory variability, suggesting right atrial pressure of 15 mmHg.  8. Moderate pleural effusion in the left lateral region. FINDINGS  Left Ventricle: Left ventricular ejection fraction, by estimation, is <20%. The left ventricle has severely decreased function. The  left ventricle demonstrates global hypokinesis. Definity  contrast agent was given IV to delineate the left ventricular endocardial borders. The left ventricular internal cavity size  was mildly dilated. There is no left ventricular hypertrophy. The interventricular septum is flattened in diastole ('D' shaped left ventricle), consistent with right ventricular volume overload. Left ventricular diastolic parameters are consistent with Grade II diastolic dysfunction (pseudonormalization). Elevated left atrial pressure. Right Ventricle: The right ventricular size is normal. No increase in right ventricular wall thickness. Right ventricular systolic function is normal. There is moderately elevated pulmonary artery systolic pressure. The tricuspid regurgitant velocity is 3.11 m/s, and with an assumed right atrial pressure of 15 mmHg, the estimated right ventricular systolic pressure is 53.7 mmHg. Left Atrium: Left atrial size was severely dilated. Right Atrium: Right atrial size was severely dilated. Pericardium: There is no evidence of pericardial effusion. Mitral Valve: The mitral valve is degenerative in appearance. There is moderate thickening of the mitral valve leaflet(s). There is mild calcification of the mitral valve leaflet(s). Mild to moderate mitral annular calcification. Moderate mitral valve regurgitation. No evidence of mitral valve stenosis. Tricuspid Valve: The tricuspid valve is normal in structure. Tricuspid valve regurgitation is trivial. No evidence of tricuspid stenosis. Aortic Valve: The aortic valve is bicuspid. Aortic valve regurgitation is trivial. Aortic valve sclerosis/calcification is present, without any evidence of aortic stenosis. Pulmonic Valve: The pulmonic valve was normal in structure. Pulmonic valve regurgitation is trivial. No evidence of pulmonic stenosis. Aorta: The aortic root is normal in size and structure. Venous: The inferior vena cava is dilated in size with less than 50%  respiratory variability, suggesting right atrial pressure of 15 mmHg. IAS/Shunts: No atrial level shunt detected by color flow Doppler. Additional Comments: A device lead is visualized. There is a moderate pleural effusion in the left lateral region.  LEFT VENTRICLE PLAX 2D LVIDd:         5.40 cm   Diastology LVIDs:         5.10 cm   LV e' medial:    4.05 cm/s LV PW:         1.00 cm   LV E/e' medial:  17.6 LV IVS:        0.80 cm   LV e' lateral:   6.02 cm/s LVOT diam:     2.20 cm   LV E/e' lateral: 11.8 LV SV:         47 LV SV Index:   25 LVOT Area:     3.80 cm  RIGHT VENTRICLE RV Basal diam:  3.60 cm RV Mid diam:    3.30 cm RV S prime:     7.97 cm/s TAPSE (M-mode): 2.0 cm LEFT ATRIUM             Index        RIGHT ATRIUM           Index LA diam:        3.85 cm 2.07 cm/m   RA Area:     22.00 cm LA Vol (A2C):   96.7 ml 52.04 ml/m  RA Volume:   66.00 ml  35.52 ml/m LA Vol (A4C):   67.3 ml 36.22 ml/m LA Biplane Vol: 85.7 ml 46.12 ml/m  AORTIC VALVE LVOT Vmax:   75.40 cm/s LVOT Vmean:  47.600 cm/s LVOT VTI:    0.123 m  AORTA Ao Root diam: 3.10 cm Ao Asc diam:  2.90 cm MITRAL VALVE               TRICUSPID VALVE MV Area (PHT): 4.21 cm    TR Peak grad:   38.7 mmHg MV Decel Time: 180 msec  TR Vmax:        311.00 cm/s MV E velocity: 71.10 cm/s MV A velocity: 68.10 cm/s  SHUNTS MV E/A ratio:  1.04        Systemic VTI:  0.12 m                            Systemic Diam: 2.20 cm Wilbert Bihari MD Electronically signed by Wilbert Bihari MD Signature Date/Time: 10/31/2024/6:48:37 PM    Final     ASSESSMENT/PLAN:  Assessment: Principal Problem:   Acute heart failure, unspecified heart failure type (HCC) Active Problems:   Tobacco use disorder   Essential hypertension   DM2 (diabetes mellitus, type 2) (HCC)   Nausea with vomiting   COPD (chronic obstructive pulmonary disease) (HCC)   QT prolongation  [name] is a 74 y.o. female with pertinent PMH of HFrEF with ICD, CKD stage IIIb, COPD, chronic hypoxic  respiratory failure on 2 L of supplemental oxygen , hypertension, type 2 diabetes, and A-fib on Eliquis  who presented with chest pain, nausea, and vomiting and is admitted for acute heart failure exacerbation.   Plan: #Acute HFrEF exacerbation #HFrEF (04/2024 Echo EF: 20-25%)  04/2024 Echo EF: 20-25%, now repeated 10/31/2024 with EF < 20%, global hypokinesis, grade II diastolic dysfunction, tricuspid regurgitation, moderate mitral valve regurgitation, and severely dilated right and left atriums. She is tolerating IV diuresis well, and no increased oxygen  requirement. Not yet able to add back GDMT given renal function < 30. Weight this AM of 171 lbs, down from 176 lbs on admission though not at baseline of 168 lbs. Will plan to give another dose of IV Lasix  80 mg today and assess thereafter, but close to being medically ready for discharge. - s/p Lasix  80 mg IV x2 - IV Lasix  80 mg  - GDMT: - Continue bisoprolol  2.5 mg daily - Hold spironolactone  12.5 mg daily  - Hold Farxiga  10 mg daily  - Strict I/Os - Daily weights  - Keep K > 4, keep Mg > 2; replenish as needed    #AKI #CKD stage IIIb Most likely consistent with cardiorenal AKI however she had nausea vomiting for 2 days prior to admission. Cr improving today 2.07 --> 1.90 following IV diuresis, so will continue to diurese today.  - Lasix  as above - Hold spironolactone  12.5 mg daily  - Hold Farxiga  10 mg daily  - Avoid nephrotoxins  - Preferred narcotic agents for pain control are hydromorphone , fentanyl , and methadone. Morphine  should not be used.  - Baclofen should be avoided - Avoid oral sodium phosphate  and magnesium  citrate based laxatives / bowel preps  - Avoid contrast - Avoid NSAIDs    #Nausea and vomiting, resolved  #Prolonged QT Unsure of the cause of her nausea vomiting.  She has had diabetic gastroparesis documented in her chart however her only gastric emptying study available from 2018 was normal.  She does have a long  history of uncontrolled diabetes but over the past year her diabetes has been better controlled with her last A1c 8.2 on 09/30/2024.  She is on Ozempic  2 mg weekly and took her last dose day prior to admission with no worsening of her symptoms.  Low suspicion for gastroenteritis.  No marijuana use.  Her QT interval is 530 ms so if possible we will avoid Zofran .  She did get a dose of IV Ativan  but this made her very sleepy. This morning nausea and vomiting have resolved  and she has not had further episodes since day of admission.  - Tigan  200 mg every 8 hours as needed - Repeat EKG before trying another medicine if Tigan  is not working - Consider repeat gastric emptying study outpatient   #DMII A1c 8.2 on 09/30/2024.  She is currently on a low-dose of long-acting insulin  glargine 5 units nightly, Farxiga  10 mg daily, and Ozempic  2 mg weekly outpatient.  - Continue 5 units long-acting insulin  daily and sensitive sliding scale while admitted - Hold Farxiga  10 mg daily  - Hold spironolactone  12.5 mg daily  - Hold home Ozempic  2 mg weekly, resume outpatient once N/V has fully resolved    #COPD #Chronic hypoxic respiratory failure #Tobacco use disorder No signs of COPD exacerbation with stable oxygen  requirements, very minimal  wheezing in the bases that is more consistent with pulmonary edema.  Continues on home 2L [place] of supplemental oxygen .  Quit smoking this past week without medications or nicotine  replacement. - Continue Anoro Ellipta  - Continue supplemental oxygen    #Hypertension Blood pressure mildly elevated in the setting of not being able to take her blood pressure medicines and heart failure saturation. - Continue home bisoprolol  2.5 mg daily   #A-fib on Eliquis  Normal sinus rhythm here.  No signs of bleeding. - Continue home Eliquis  5 mg twice daily  - Continue home bisoprolol  2.5 mg daily  #Chest pain, resolved Chest pain felt likely to be secondary to bouts of emesis on  admission. Cardiac work up including EKG, telemetry and troponins negative for evidence of acute ischemia. Chest pain resolved by 1/30.   Best Practice: Diet: Diabetic diet VTE: DOAC  Code: Full  Disposition planning: DISPO: Anticipated discharge today to Home pending clinical improvement.  Signature:  Doyal Miyamoto, MD [name]  6:59 AM, 11/01/2024  On Call pager [phone]  "

## 2024-11-01 NOTE — TOC CM/SW Note (Signed)
 Transition of Care [facility]) - Inpatient Brief Assessment   Patient Details  Name: [name] MRN: [id number] Date of Birth: 01-25-1951  Transition of Care [facility]) CM/SW Contact:    Waddell Barnie Rama, RN Phone Number: 11/01/2024, 10:18 AM   Clinical Narrative: From home alone, has PCP and insurance on file, states has no HH services in place at this time , has walker/cane at home.  States family member will transport them home at [employer] and family is support system, states gets medications from [facility] on [address], but she is ok with [facility] pharmacy filling.  Pta self ambulatory with walker/cane.   There are no ICM needs identified  at this time.  Please place consult for ICM needs.     Transition of Care Asessment: Insurance and Status: Insurance coverage has been reviewed Patient has primary care physician: Yes Home environment has been reviewed: home alone Prior level of function:: indep Prior/Current Home Services: Current home services (walker /cane) Social Drivers of Health Review: SDOH reviewed no interventions necessary Readmission risk has been reviewed: Yes Transition of care needs: no transition of care needs at this time

## 2024-11-01 NOTE — Plan of Care (Signed)
  Problem: Coping: Goal: Ability to adjust to condition or change in health will improve Outcome: Progressing   Problem: Nutritional: Goal: Maintenance of adequate nutrition will improve Outcome: Progressing   Problem: Education: Goal: Knowledge of General Education information will improve Description: Including pain rating scale, medication(s)/side effects and non-pharmacologic comfort measures Outcome: Progressing

## 2024-11-01 NOTE — TOC Transition Note (Addendum)
 Transition of Care [facility]) - Discharge Note   Patient Details  Name: [name] MRN: [id number] Date of Birth: 05-02-1951  Transition of Care [facility]) CM/SW Contact:  Waddell Barnie Rama, RN Phone Number: 11/01/2024, 10:21 AM   Clinical Narrative:    For dc today, she states she has a family member who will transport her home.  She has follow up apt on [facility].         Patient Goals and CMS Choice            Discharge Placement                       Discharge Plan and Services Additional resources added to the After Visit Summary for                                       Social Drivers of Health (SDOH) Interventions SDOH Screenings   Food Insecurity: No Food Insecurity (10/30/2024)  Housing: Low Risk (10/30/2024)  Transportation Needs: No Transportation Needs (10/30/2024)  Utilities: Not At Risk (10/30/2024)  Alcohol Screen: Low Risk (01/09/2024)  Depression (PHQ2-9): Low Risk (09/30/2024)  Financial Resource Strain: Low Risk (01/09/2024)  Physical Activity: Inactive (01/09/2024)  Social Connections: Moderately Integrated (10/30/2024)  Stress: No Stress Concern Present (01/09/2024)  Tobacco Use: High Risk (10/30/2024)  Health Literacy: Adequate Health Literacy (01/09/2024)     Readmission Risk Interventions    11/01/2024   10:17 AM 06/22/2022   11:43 AM  Readmission Risk Prevention Plan  Transportation Screening Complete Complete  PCP or Specialist Appt within 5-7 Days Complete Complete  Home Care Screening Complete Complete  Medication Review (RN CM) Complete Referral to Pharmacy

## 2024-11-01 NOTE — Discharge Summary (Signed)
 "  Name: [name] MRN: [id number] DOB: [birth year] 74 y.o. PCP: Peggy Homans, MD  Date of Admission: 10/30/2024  5:37 AM Date of Discharge: 11/01/2024 Attending Physician: Dr. Reyes [name]  Discharge Diagnosis: 1. Principal Problem:   Acute heart failure, unspecified heart failure type (HCC) Active Problems:   Tobacco use disorder   Essential hypertension   DM2 (diabetes mellitus, type 2) (HCC)   Nausea with vomiting   COPD (chronic obstructive pulmonary disease) (HCC)   QT prolongation    Discharge Medications: Allergies as of 11/01/2024       Reactions   Actos [pioglitazone] Other (See Comments)   congestive heart failure   Naproxen  Other (See Comments)   500mg  dose made her sleep for two days   Metformin  And Related Other (See Comments)   Caused kidney problems    Nsaids Other (See Comments)   Contraindication due to CKD IV    Rosiglitazone Hives   Hydrocodone -acetaminophen  Itching   Hydrocortisone Hives, Itching   Tomato Itching        Medication List     PAUSE taking these medications    dapagliflozin  propanediol 10 MG Tabs tablet Wait to take this until your doctor or other care provider tells you to start again. Commonly known as: Farxiga  Take 1 tablet (10 mg total) by mouth daily before breakfast.       TAKE these medications    albuterol  108 (90 Base) MCG/ACT inhaler Commonly known as: VENTOLIN  HFA INHALE 2 PUFFS INTO THE LUNGS EVERY 6 HOURS AS NEEDED FOR WHEEZING OR SHORTNESS OF BREATH   apixaban  5 MG Tabs tablet Commonly known as: ELIQUIS  Take 1 tablet (5 mg total) by mouth 2 (two) times daily.   atorvastatin  80 MG tablet Commonly known as: LIPITOR  Take 1 tablet (80 mg total) by mouth at bedtime.   bisoprolol  5 MG tablet Commonly known as: ZEBETA  Take 0.5 tablets (2.5 mg total) by mouth daily.   Dexcom G7 Sensor Misc Change sensor every 10 days   diclofenac  Sodium 1 % Gel Commonly known as: VOLTAREN  Apply 4 g topically 4  (four) times daily. What changed:  when to take this reasons to take this   insulin  degludec 100 UNIT/ML FlexTouch Pen Commonly known as: TRESIBA  Inject 5 Units into the skin daily. What changed: how much to take   ketoconazole  2 % shampoo Commonly known as: NIZORAL  Apply 1 Application topically once a week.   levocetirizine 5 MG tablet Commonly known as: XYZAL  Take 1 tablet (5 mg total) by mouth at bedtime. What changed:  when to take this reasons to take this   OXYGEN  Inhale 2 L into the lungs continuous.   Ozempic  (2 MG/DOSE) 8 MG/3ML Sopn Generic drug: Semaglutide  (2 MG/DOSE) Inject 2 mg into the skin once a week.   spironolactone  25 MG tablet Commonly known as: ALDACTONE  Take 0.5 tablets (12.5 mg total) by mouth daily.   torsemide  20 MG tablet Commonly known as: DEMADEX  Take 1 tablet (20 mg total) by mouth every Monday, Wednesday, and Friday.   triamcinolone  ointment 0.1 % Commonly known as: KENALOG  APPLY TOPICALLY TWICE DAILY MIX WITH EUCERIN   umeclidinium-vilanterol 62.5-25 MCG/ACT Aepb Commonly known as: Anoro Ellipta  Inhale 1 puff into the lungs daily.        Disposition and follow-up:   [name] was discharged from [facility] in Stable condition.  At the hospital follow up visit please address:  1.  HFrEF: 10/2024 Echo with EF <  20%, global hypokinesis, grade II diastolic dysfunction, tricuspid regurgitation, moderate mitral valve regurgitation, and severely dilated right and left atriums. She tolerated diuresis well and improvement of her weight toward baseline by day of discharge. She did not have any increased oxygen  requirement from her home 2L [name]. Please assess renal function at f/u visit to determine if appopriate to add back the rest of her GDMT which was held due to Cr < 30 and some soft BP while admitted.  - BMP for Cr, eGFR, and K at f/u  - Determine if okay to resume Farxiga  10 mg daily - Spironolactone  12.5 mg daily  was resumed on discharge, please ensure K on BMP is okay to continue Spiro at f/u  - Please ensure she follows up in the HF clinic outpatient   AKI on CKD3b: felt this was cardiorenal as AKI improved daily with IV diuresis, despite the fact that she had presented with NVD and resolved.  - Please recheck BMP as above and determine if appropriate to resume Farxiga   DMII: Ozempic  2 mg was held during admission. If her GI symptoms have not returned, ensure patient has resumed this by discharge.  - Please recheck BMP as above and determine if appropriate to resume Farxiga   2.  Labs / imaging needed at time of follow-up: BMP  3.  Pending labs/ test needing follow-up: None  Follow-up Appointments:  Follow-up Information     Peggy Holmes, DO. Go on 11/08/2024.   Specialty: Internal Medicine Why: Please go to your appointment with Dr. Charmayne in the Internal Medicine clinic on 11/08/2024 at 9:15 am. Please arrive 15 minutes early and bring all of your medications in their original bottles with you to this appointment. Contact information: [address] [place] [address] [phone]         Peggy, Peggy SAUNDERS, MD. Schedule an appointment as soon as possible for a visit.   Specialty: Cardiology Contact information: [address] [place] [address] [phone]                  Hospital Course by problem list: [name] is a 74 y.o. female with pertinent PMH of HFrEF with ICD, CKD stage IIIb, COPD, chronic hypoxic respiratory failure on 2 L of supplemental oxygen , hypertension, type 2 diabetes, and A-fib on Eliquis  who presented with chest pain, nausea, and vomiting and is admitted for acute heart failure exacerbation now being discharged on hospital day 1 with the following pertinent hospital course:  #Acute HFrEF exacerbation #HFrEF (10/2024 Echo EF: <20%)  Presented following 2 days of nausea and vomiting. Found to have increased proBNP  in the 6000s.Overall exacerbation was mild without increased oxygen  requirements, signs of poor perfusion, or severe dyspnea.  Most likely due to stress of nausea and vomiting and inability to take her torsemide .  Supporting evidence of increased proBNP, JVD, increased weight from baseline. She was given IV diuresis with improvement of daily weights toward her baseline dry weight as well as improvement of her renal function. Not yet able to add back GDMT given renal function < 30. Weight on day of discharge 171, down from 176 lbs on admission. She thinks her baseline dry weight is 168 lbs. She remained without increased oxygen  requirement from her home 2L [place]. She was monitored daily with labs, I/Os, and daily weights. Repeat Echo this admission showed EF < 20%, global hypokinesis, grade II diastolic dysfunction, tricuspid regurgitation, moderate mitral valve regurgitation, and severely  dilated right and left atriums.  - GDMT: - Continue bisoprolol  2.5 mg daily - Continue Torsemide  20 mg daily  - Continue spironolactone  12.5 mg daily  - Hold Farxiga  10 mg daily - repeat BMP at f/u to determine if appropriate to resume the rest of her GDMT given renal function   #Chest pain, resolved Chest pain felt likely to be secondary to bouts of emesis on admission. Cardiac work up including EKG, telemetry and troponins negative for evidence of acute ischemia. Chest pain resolved by 1/30.    #AKI, improving  #CKD stage IIIb Most likely consistent with cardiorenal AKI however she had nausea vomiting for 2 days prior to admission. Cr improving 2.07 --> 1.90 following IV diuresis. Continued to diurese and held GDMT MRA and SGLT2-i given decreased renal function. Avoided nephrotoxins during admission. Resumed Spironolactone  on discharge.  - Repeat BMP at f/u to assess renal function - Continue home spironolactone  12.5 mg daily  - Hold home Farxiga  10 mg daily    #Nausea and vomiting, resolved  #Prolonged QT Unsure  of the cause of her nausea vomiting.  She has had diabetic gastroparesis documented in her chart however her only gastric emptying study available from 2018 was normal.  She does have a long history of uncontrolled diabetes but over the past year her diabetes has been better controlled with her last A1c 8.2 on 09/30/2024.  She is on Ozempic  2 mg weekly and took her last dose day prior to admission with no worsening of her symptoms.  Low suspicion for gastroenteritis.  No marijuana use.  Her QT interval was 530 ms so avoided Zofran  and given Tigan  instead.  She did get a dose of IV Ativan  but this made her very sleepy. By day of discharge nausea and vomiting had resolved, with no further episodes since the day of admission.  - Consider repeat gastric emptying study outpatient   #DMII A1c 8.2 on 09/30/2024.  She is currently on a low-dose of long-acting insulin  glargine 5 units nightly, Farxiga  10 mg daily, and Ozempic  2 mg weekly outpatient.  - Continue 5 units long-acting insulin  daily - Hold Farxiga  10 mg daily  - Continue home spironolactone  12.5 mg daily  - Resume home Ozempic  2 mg weekly on discharge   #COPD #Chronic hypoxic respiratory failure #Tobacco use disorder No signs of COPD exacerbation with stable oxygen  requirements, very minimal  wheezing in the bases that is more consistent with pulmonary edema.  Continues on home 2L [place] of supplemental oxygen .  Quit smoking this past week without medications or nicotine  replacement. - Continue Anoro Ellipta  - Continue home supplemental oxygen  2L [place]    #Hypertension Blood pressure monitored daily.  - Continue home bisoprolol  2.5 mg daily   #A-fib on Eliquis  Normal sinus rhythm here.  No signs of bleeding. - Continue home Eliquis  5 mg twice daily  - Continue home bisoprolol  2.5 mg daily   Subjective Patient was assessed bedside after having eaten breakfast; laying comfortably in bed on 2 L of oxygen  in no acute distress. Overnight she had 5  episodes of diarrhea and patient stated that it was because she drank milk and that tends to cause diarrhea/loose stools. Since receiving pepto bismol she has not have any more diarrhea. Denies nausea or vomiting since admission. Denies CP or SOB. Stated that after receiving IV lasix  she thinks she peed well in the morning. Patient agreeable to additional lasix  dose this AM then going home. Patient only on home oxygen  at night.  Discharge Exam:   BP 125/89 (BP Location: Right Arm)   Pulse 78   Temp 97.9 F (36.6 C) (Oral)   Resp 18   Ht 5' 5 (1.651 m)   Wt 78 kg   SpO2 100%   BMI 28.62 kg/m   Physical Exam:   Constitutional: well-appearing female sitting on edge of hospital bed, in no acute distress HEENT: normocephalic atraumatic, mucous membranes moist Cardiovascular: regular rate and rhythm, bilateral radial pulses 2+, bilateral dorsal pedal pulses 2+, brisk capillary refill bilateral feet and hands Pulmonary/Chest: normal work of breathing on home 2L [place], lungs generally clear to auscultation bilaterally with improving sounds Abdominal: soft, non-tender, non-distended MSK: normal bulk and tone. Neurological: alert & oriented x 3 Skin: warm and dry Psych: mood calm, behavior normal, thought content normal, judgement normal    Pertinent Labs, Studies, and Procedures:     Latest Ref Rng & Units 11/01/2024    2:37 AM 10/31/2024    2:26 AM 10/30/2024    9:06 AM  CBC  WBC 4.0 - 10.5 K/uL 10.3  8.6    Hemoglobin 12.0 - 15.0 g/dL 87.7  86.9  86.6   Hematocrit 36.0 - 46.0 % 39.4  41.6  39.0   Platelets 150 - 400 K/uL 293  295         Latest Ref Rng & Units 11/01/2024    2:37 AM 10/31/2024    2:26 AM 10/30/2024   10:21 PM  CMP  Glucose 70 - 99 mg/dL 839  97  886   BUN 8 - 23 mg/dL 52  50  51   Creatinine 0.44 - 1.00 mg/dL 8.09  8.05  7.98   Sodium 135 - 145 mmol/L 142  144  144   Potassium 3.5 - 5.1 mmol/L 3.6  4.3  4.4   Chloride 98 - 111 mmol/L 102  104  102   CO2 22 - 32  mmol/L 25  25  23    Calcium  8.9 - 10.3 mg/dL 8.9  9.3  9.0     ECHOCARDIOGRAM COMPLETE Result Date: 10/31/2024    ECHOCARDIOGRAM REPORT   Patient Name:   [name] Date of Exam: 10/31/2024 Medical Rec #:  [id number]    Height:       65.0 in Accession #:    [id number]   Weight:       172.6 lb Date of Birth:  August 11, 1951   BSA:          1.858 m Patient Age:    73 years     BP:           141/93 mmHg Patient Gender: F            HR:           80 bpm. Exam Location:  Inpatient Procedure: 2D Echo, Cardiac Doppler, Color Doppler and Intracardiac            Opacification Agent (Both Spectral and Color Flow Doppler were            utilized during procedure). Indications:    Chest Pain, Dyspnea  History:        Patient has prior history of Echocardiogram examinations, most                 recent 04/14/2024. CHF, Defibrillator, COPD, Signs/Symptoms:Chest                 Pain and Dyspnea; Risk Factors:Hypertension, Diabetes,  Dyslipidemia and Current Smoker.  Sonographer:    Juliene Rucks Referring Phys: [id number] JEFFREY C HATCHER  Sonographer Comments: Patient is obese. Image acquisition challenging due to COPD. IMPRESSIONS  1. Left ventricular ejection fraction, by estimation, is <20%. The left ventricle has severely decreased function. The left ventricle demonstrates global hypokinesis. The left ventricular internal cavity size was mildly dilated. Left ventricular diastolic parameters are consistent with Grade II diastolic dysfunction (pseudonormalization). Elevated left atrial pressure. There is the interventricular septum is flattened in diastole ('D' shaped left ventricle), consistent with right ventricular volume overload.  2. Right ventricular systolic function is normal. The right ventricular size is normal. There is moderately elevated pulmonary artery systolic pressure. The estimated right ventricular systolic pressure is 53.7 mmHg.  3. Left atrial size was severely dilated.  4. Right atrial size was  severely dilated.  5. The mitral valve is degenerative. Moderate mitral valve regurgitation. No evidence of mitral stenosis.  6. The aortic valve is bicuspid. Aortic valve regurgitation is trivial. Aortic valve sclerosis/calcification is present, without any evidence of aortic stenosis.  7. The inferior vena cava is dilated in size with <50% respiratory variability, suggesting right atrial pressure of 15 mmHg.  8. Moderate pleural effusion in the left lateral region. FINDINGS  Left Ventricle: Left ventricular ejection fraction, by estimation, is <20%. The left ventricle has severely decreased function. The left ventricle demonstrates global hypokinesis. Definity  contrast agent was given IV to delineate the left ventricular endocardial borders. The left ventricular internal cavity size was mildly dilated. There is no left ventricular hypertrophy. The interventricular septum is flattened in diastole ('D' shaped left ventricle), consistent with right ventricular volume overload. Left ventricular diastolic parameters are consistent with Grade II diastolic dysfunction (pseudonormalization). Elevated left atrial pressure. Right Ventricle: The right ventricular size is normal. No increase in right ventricular wall thickness. Right ventricular systolic function is normal. There is moderately elevated pulmonary artery systolic pressure. The tricuspid regurgitant velocity is 3.11 m/s, and with an assumed right atrial pressure of 15 mmHg, the estimated right ventricular systolic pressure is 53.7 mmHg. Left Atrium: Left atrial size was severely dilated. Right Atrium: Right atrial size was severely dilated. Pericardium: There is no evidence of pericardial effusion. Mitral Valve: The mitral valve is degenerative in appearance. There is moderate thickening of the mitral valve leaflet(s). There is mild calcification of the mitral valve leaflet(s). Mild to moderate mitral annular calcification. Moderate mitral valve regurgitation.  No evidence of mitral valve stenosis. Tricuspid Valve: The tricuspid valve is normal in structure. Tricuspid valve regurgitation is trivial. No evidence of tricuspid stenosis. Aortic Valve: The aortic valve is bicuspid. Aortic valve regurgitation is trivial. Aortic valve sclerosis/calcification is present, without any evidence of aortic stenosis. Pulmonic Valve: The pulmonic valve was normal in structure. Pulmonic valve regurgitation is trivial. No evidence of pulmonic stenosis. Aorta: The aortic root is normal in size and structure. Venous: The inferior vena cava is dilated in size with less than 50% respiratory variability, suggesting right atrial pressure of 15 mmHg. IAS/Shunts: No atrial level shunt detected by color flow Doppler. Additional Comments: A device lead is visualized. There is a moderate pleural effusion in the left lateral region.  LEFT VENTRICLE PLAX 2D LVIDd:         5.40 cm   Diastology LVIDs:         5.10 cm   LV e' medial:    4.05 cm/s LV PW:         1.00 cm   LV  E/e' medial:  17.6 LV IVS:        0.80 cm   LV e' lateral:   6.02 cm/s LVOT diam:     2.20 cm   LV E/e' lateral: 11.8 LV SV:         47 LV SV Index:   25 LVOT Area:     3.80 cm  RIGHT VENTRICLE RV Basal diam:  3.60 cm RV Mid diam:    3.30 cm RV S prime:     7.97 cm/s TAPSE (M-mode): 2.0 cm LEFT ATRIUM             Index        RIGHT ATRIUM           Index LA diam:        3.85 cm 2.07 cm/m   RA Area:     22.00 cm LA Vol (A2C):   96.7 ml 52.04 ml/m  RA Volume:   66.00 ml  35.52 ml/m LA Vol (A4C):   67.3 ml 36.22 ml/m LA Biplane Vol: 85.7 ml 46.12 ml/m  AORTIC VALVE LVOT Vmax:   75.40 cm/s LVOT Vmean:  47.600 cm/s LVOT VTI:    0.123 m  AORTA Ao Root diam: 3.10 cm Ao Asc diam:  2.90 cm MITRAL VALVE               TRICUSPID VALVE MV Area (PHT): 4.21 cm    TR Peak grad:   38.7 mmHg MV Decel Time: 180 msec    TR Vmax:        311.00 cm/s MV E velocity: 71.10 cm/s MV A velocity: 68.10 cm/s  SHUNTS MV E/A ratio:  1.04        Systemic VTI:   0.12 m                            Systemic Diam: 2.20 cm Wilbert Bihari MD Electronically signed by Wilbert Bihari MD Signature Date/Time: 10/31/2024/6:48:37 PM    Final    DG Chest 2 View Result Date: 10/30/2024 EXAM: 2 VIEW(S) XRAY OF THE CHEST 10/30/2024 06:14:00 AM COMPARISON: 09/11/2024 CLINICAL HISTORY: Chest pain. FINDINGS: LINES, TUBES AND DEVICES: Left chest AICD with distal tip in right ventricle. LUNGS AND PLEURA: Bilateral interstitial opacities, likely due to pulmonary edema. Small left pleural effusion. No pneumothorax. HEART AND MEDIASTINUM: Moderate cardiomegaly. Aortic atherosclerosis. BONES AND SOFT TISSUES: Multilevel thoracic osteophytosis. IMPRESSION: 1. Mild pulmonary edema and small left pleural effusion. 2. Moderate cardiomegaly. Electronically signed by: Waddell Calk MD 10/30/2024 06:24 AM EST RP Workstation: [id number]     Discharge Instructions: Discharge Instructions     Increase activity slowly   Complete by: As directed          Discharge Instructions      To [name] or their caretakers,  They were admitted to [facility] on 10/30/2024 for evaluation and treatment of: nausea, vomiting, and chest pain  The evaluation suggested you were having an acute heart failure exacerbation. You had a heart work up that did not show evidence of a heart attack. Your nausea and vomiting luckily improved on its own. You were treated with IV diuresis, electrolyte replenishment, and close monitoring.  They were discharged from the hospital on 11/01/24. I recommend the following after leaving the hospital:   Medications Adjusted During Hospital Admission:    1) DO NOT TAKE Farxiga  until instructed by your PCP  You may take all of your other medications as prescribed. You may resume Ozempic  weekly if your stomach issues have resolved.   Please attend your follow up appointments and make appointments as below:   Follow-up Information     Peggy Holmes, DO. Go on 11/08/2024.   Specialty: Internal Medicine Why: Please go to your appointment with Dr. Charmayne in the Internal Medicine clinic on 11/08/2024 at 9:15 am. Please arrive 15 minutes early and bring all of your medications in their original bottles with you to this appointment. Contact information: [address] [place] [address] [phone]         Peggy, Peggy SAUNDERS, MD. Schedule an appointment as soon as possible for a visit.   Specialty: Cardiology Contact information: [address] [place] [address] [phone]                   For questions about your care plan, until you are able to see your primary doctor: Call [phone]. Dial [phone] for the operator. Ask for the internal medicine resident on call.  Thank you for allowing us  to be part of your care.   Doyal Miyamoto, MD 11/01/2024, 9:44 AM         Signed: Doyal Miyamoto, MD 11/01/2024, 10:10 AM    "

## 2024-11-04 ENCOUNTER — Telehealth: Payer: Self-pay

## 2024-11-04 NOTE — Transitions of Care (Post Inpatient/ED Visit) (Signed)
" ° °  11/04/2024  Name: [name] MRN: [id number] DOB: May 30, 1951  Today's TOC FU Call Status: Today's TOC FU Call Status:: Unsuccessful Call (1st Attempt) Unsuccessful Call (1st Attempt) Date: 11/04/24  Attempted to reach the patient regarding the most recent Inpatient/ED visit.  Follow Up Plan: Additional outreach attempts will be made to reach the patient to complete the Transitions of Care (Post Inpatient/ED visit) call.   Medford Balboa, BSN, RN [place]  VBCI - Population Health RN Care Manager [phone]  "

## 2024-11-05 ENCOUNTER — Telehealth: Payer: Self-pay | Admitting: *Deleted

## 2024-11-05 DIAGNOSIS — R296 Repeated falls: Secondary | ICD-10-CM

## 2024-11-05 NOTE — Telephone Encounter (Signed)
 Copied from CRM [id number]. Topic: Clinical - Home Health Verbal Orders >> Nov 05, 2024 12:20 PM Chiquita SQUIBB wrote: Caller/Agency: Doyal with [facility]  Callback Number: [phone] Service Requested: Physical Therapy and OT Frequency: She is asking if the provider would like them to resume services since the patient was released from the hospital  Any new concerns about the patient? No

## 2024-11-06 ENCOUNTER — Telehealth: Payer: Self-pay

## 2024-11-06 NOTE — Patient Instructions (Signed)
 Visit Information  Thank you for taking time to visit with me today. Please don't hesitate to contact me if I can be of assistance to you before our next scheduled telephone appointment.  Our next appointment is by telephone on Wednesday February 11th at 3:30pm  Following is a copy of your care plan:   Goals Addressed             This Visit's Progress    VBCI Transitions of Care (TOC) Care Plan       Problems:  Recent Hospitalization for treatment of CHF Hospital or ED Adm Risk: 89%  Goal:  Over the next 30 days, the patient will not experience hospital readmission  Interventions:    Heart Failure Interventions: Basic overview and discussion of pathophysiology of Heart Failure reviewed Provided education on low sodium diet Assessed need for readable accurate scales in home Provided education about placing scale on hard, flat surface Advised patient to weigh each morning after emptying bladder Discussed importance of daily weight and advised patient to weigh and record daily Reviewed role of diuretics in prevention of fluid overload and management of heart failure; Discussed the importance of keeping all appointments with provider Assessed social determinant of health barriers  PCP appointment scheduled for 11/12/24 Encourage the patient to take her statin even though she thinks she doesn't need it.  RA referral for Psoriasis - Appointment in April Farxiga  is on hold until manually resumed by the provider The patent does not weigh herself but monitors the swelling in her feet. She thinks she might need to be on additional Torsemide  instead of three times a week Oxygen  at 2l/min continuous ROC for Home Health Services - the patient is declining at this time  Patient Self Care Activities:  Attend all scheduled provider appointments Call pharmacy for medication refills 3-7 days in advance of running out of medications Call provider office for new concerns or questions  Notify  RN Care Manager of TOC call rescheduling needs Participate in Transition of Care Program/Attend TOC scheduled calls Perform all self care activities independently  Take medications as prescribed    Plan:  Telephone follow up appointment with care management team member scheduled for:  the patient has completed her goals. Discharged from the [facility] 30 Day program        Patient verbalizes understanding of instructions and care plan provided today and agrees to view in MyChart. Active MyChart status and patient understanding of how to access instructions and care plan via MyChart confirmed with patient.     The patient has been provided with contact information for the care management team and has been advised to call with any health related questions or concerns.   Please call the care guide team at [phone] if you need to cancel or reschedule your appointment.   Please call the Suicide and Crisis Lifeline: 988 call the [place]  National Suicide Prevention Lifeline: (947) 163-7336 or TTY: (531)192-5426 TTY [phone]) to talk to a trained counselor if you are experiencing a Mental Health or Behavioral Health Crisis or need someone to talk to.  Medford Balboa, BSN, RN [place]  VBCI - [employer] Health RN Care Manager [phone]

## 2024-11-06 NOTE — Transitions of Care (Post Inpatient/ED Visit) (Signed)
 "  11/06/2024  Name: [name] MRN: [id number] DOB: 1950/10/12  Today's TOC FU Call Status: Today's TOC FU Call Status:: Successful TOC FU Call Completed TOC FU Call Complete Date: 11/06/24  Patient's Name and Date of Birth confirmed. Name, DOB  Transition Care Management Follow-up Telephone Call Date of Discharge: 11/01/24 Discharge Facility: [name]) Type of Discharge: Inpatient Admission Primary Inpatient Discharge Diagnosis:: Heart Failure How have you been since you were released from the hospital?: Better Any questions or concerns?: No  Items Reviewed: Did you receive and understand the discharge instructions provided?: Yes Medications obtained,verified, and reconciled?: Yes (Medications Reviewed) Any new allergies since your discharge?: No Dietary orders reviewed?: Yes Type of Diet Ordered:: Low sodium Heart Healthy Do you have support at home?: Yes People in Home [RPT]: alone Name of Support/Comfort Primary Source: [name] Jeter, daughter  Medications Reviewed Today: Medications Reviewed Today     Reviewed by Moises Reusing, RN (Case Manager) on 11/06/24 at 1621  Med List Status: <None>   Medication Order Taking? Sig Documenting Provider Last Dose Status Informant  albuterol  (VENTOLIN  HFA) 108 (90 Base) MCG/ACT inhaler [id number] No INHALE 2 PUFFS INTO THE LUNGS EVERY 6 HOURS AS NEEDED FOR WHEEZING OR SHORTNESS OF SHERIDA [name], MD Unknown Active Self, Pharmacy Records  apixaban  (ELIQUIS ) 5 MG TABS tablet [id number] No Take 1 tablet (5 mg total) by mouth 2 (two) times daily. Charmayne Holmes, DO 10/29/2024  3:00 PM Active Self, Pharmacy Records  atorvastatin  (LIPITOR ) 80 MG tablet [id number] No Take 1 tablet (80 mg total) by mouth at bedtime. Elicia Sharper, DO 10/29/2024 Active Self, Pharmacy Records  bisoprolol  (ZEBETA ) 5 MG tablet [id number] No Take 0.5 tablets (2.5 mg total) by mouth daily.  Patient not taking: Reported on 10/30/2024   Charmayne Holmes, DO  Not Taking Active Self, Pharmacy Records  Continuous Glucose Sensor [facility] G7 SENSOR) [place] [id number] No Change sensor every 10 days Charmayne Holmes, DO 10/30/2024 Active Self, Pharmacy Records  dapagliflozin  propanediol (FARXIGA ) 10 MG TABS tablet [id number] No Take 1 tablet (10 mg total) by mouth daily before breakfast. Elicia Sharper, DO 10/29/2024 Active Self, Pharmacy Records  diclofenac  Sodium (VOLTAREN ) 1 % GEL [id number] No Apply 4 g topically 4 (four) times daily.  Patient taking differently: Apply 4 g topically 2 (two) times daily as needed (pain).   D'Mello, Rosalyn, DO Past Week Active Self, Pharmacy Records  insulin  degludec (TRESIBA ) 100 UNIT/ML FlexTouch Pen [id number] No Inject 5 Units into the skin daily.  Patient taking differently: Inject 2 Units into the skin daily.   D'Mello, Rosalyn, DO 10/29/2024 Active Self, Pharmacy Records  ketoconazole  (NIZORAL ) 2 % shampoo [id number] No Apply 1 Application topically once a week. [provider] 10/29/2024 Active Self, Pharmacy Records           Med Note LEOBARDO, NICOLE   Sat Apr 13, 2024 10:40 PM) Patient uses on Tuesdays  levocetirizine (XYZAL ) 5 MG tablet [id number] No Take 1 tablet (5 mg total) by mouth at bedtime.  Patient taking differently: Take 5 mg by mouth at bedtime as needed for allergies.   Elicia Sharper, DO Unknown Active Self, Pharmacy Records           Med Note [facility] PULLING A   Tue May 07, 2024  3:07 PM)    OXYGEN  [id number] No Inhale 2 L into the lungs continuous. [provider] 10/30/2024 Active Self, Pharmacy Records  Semaglutide , 2 MG/DOSE, (OZEMPIC , 2 MG/DOSE,) 8 MG/3ML SOPN [id number] No Inject  2 mg into the skin once a week. Charmayne Holmes, DO 10/29/2024 Active Self, Pharmacy Records  spironolactone  (ALDACTONE ) 25 MG tablet [id number] No Take 0.5 tablets (12.5 mg total) by mouth daily. Charmayne Holmes, DO 10/29/2024 Active Self, Pharmacy Records  torsemide  (DEMADEX ) 20 MG tablet [id number] No Take 1  tablet (20 mg total) by mouth every Monday, Wednesday, and Friday. Elicia Sharper, DO 10/29/2024 Active Self, Pharmacy Records  triamcinolone  ointment (KENALOG ) 0.1 % [id number] No APPLY TOPICALLY TWICE DAILY MIX WITH EUCERIN Amoako, Prince, MD 10/29/2024 Active Self, Pharmacy Records  umeclidinium-vilanterol (ANORO ELLIPTA ) 62.5-25 MCG/ACT AEPB [id number] No Inhale 1 puff into the lungs daily. Charmayne Holmes, DO 10/29/2024 Active Self, Pharmacy Records            Home Care and Equipment/Supplies: Were Home Health Services Ordered?: Yes Name of Home Health Agency:: [facility]. the patient states she doesn't need it Has Agency set up a time to come to your home?: No EMR reviewed for Home Health Orders: Orders present/patient has not received call (refer to CM for follow-up) Any new equipment or medical supplies ordered?: NA  Functional Questionnaire: Do you need assistance with bathing/showering or dressing?: No Do you need assistance with meal preparation?: No Do you need assistance with eating?: No Do you have difficulty maintaining continence: No Do you need assistance with getting out of bed/getting out of a chair/moving?: No Do you have difficulty managing or taking your medications?: No  Follow up appointments reviewed: PCP Follow-up appointment confirmed?: Yes Date of PCP follow-up appointment?: 11/12/24 Follow-up Provider: [facility] Follow-up appointment confirmed?: NA Do you need transportation to your follow-up appointment?: No Do you understand care options if your condition(s) worsen?: Yes-patient verbalized understanding  SDOH Interventions Today    Flowsheet Row Most Recent Value  SDOH Interventions   Food Insecurity Interventions Intervention Not Indicated  Housing Interventions Intervention Not Indicated  Transportation Interventions Intervention Not Indicated  Utilities Interventions Intervention Not Indicated    Goals Addressed              This Visit's Progress    VBCI Transitions of Care (TOC) Care Plan       Problems:  Recent Hospitalization for treatment of CHF Hospital or ED Adm Risk: 89%  Goal:  Over the next 30 days, the patient will not experience hospital readmission  Interventions:    Heart Failure Interventions: Basic overview and discussion of pathophysiology of Heart Failure reviewed Provided education on low sodium diet Assessed need for readable accurate scales in home Provided education about placing scale on hard, flat surface Advised patient to weigh each morning after emptying bladder Discussed importance of daily weight and advised patient to weigh and record daily Reviewed role of diuretics in prevention of fluid overload and management of heart failure; Discussed the importance of keeping all appointments with provider Assessed social determinant of health barriers  PCP appointment scheduled for 11/12/24 Encourage the patient to take her statin even though she thinks she doesn't need it.  RA referral for Psoriasis - Appointment in April Farxiga  is on hold until manually resumed by the provider The patent does not weigh herself but monitors the swelling in her feet. She thinks she might need to be on additional Torsemide  instead of three times a week Oxygen  at 2l/min continuous ROC for Home Health Services - the patient is declining at this time  Patient Self Care Activities:  Attend all scheduled provider appointments Call pharmacy for medication refills 3-7 days in advance of running  out of medications Call provider office for new concerns or questions  Notify RN Care Manager of TOC call rescheduling needs Participate in Transition of Care Program/Attend TOC scheduled calls Perform all self care activities independently  Take medications as prescribed    Plan:  Telephone follow up appointment with care management team member scheduled for:  the patient has completed her goals. Discharged from  the [facility] 30 Day program         Medford Balboa, BSN, RN [place]  VBCI - [facility] Health RN Care Manager [phone]  "

## 2024-11-07 ENCOUNTER — Other Ambulatory Visit

## 2024-11-07 ENCOUNTER — Telehealth: Payer: Self-pay

## 2024-11-07 NOTE — Progress Notes (Unsigned)
 "  11/07/2024 Name: KIRI HINDERLITER MRN: [id number] DOB: [birth year]  No chief complaint on file.   AERALYN BARNA is a 74 y.o. year old female who presented for a telephone visit.   They were referred to the pharmacist by their PCP for assistance in managing complex medication management. PMH includes HTN, HFrEF (EF 20-25%) s/p ICD, atrial flutter, COPD, T2DM with gastroparesis, MASL, CKD4, HLD, BMI > 30, tobacco use.   Subjective: Patient was last seen by [name], MD, on 05/06/24. At last visit, BP was 109/58 mmHg, HR 89 bpm. Patient appeared to be volume overloaded in the setting of holding potassium, spironolactone , and torsemide  for AKI and hyperkalemia. She was restarted on torsemide  20 mg daily. Her labs returned with Scr further increased from 2.39 > 3.30, K 2.9 and she was directly admitted for IV diuresis. She was admitted from 05/07/24 to 05/13/24. She was found to have a UTI and Farxiga  was held during admission and at discharge. Her bisoprolol  and spironolactone  were dose-reduced. She was instructed to take torsemide  PRN.  She was discharged to SNF [facility]). She was initially scheduled for a telephone appt with pharmacy on 05/16/24, but this was reschedule since she was still residing at [facility]. Patient was seen by cardiology, [name], PA on 05/21/24. Patient reported that she was taking torsemide  PRN for intermittent swelling, and was instructed to continue torsemide  20 mg PRN as she was hypervolemic on exam. No other changes were made to her medication regimen. She was engaged by pharmacy via telephone on 05/30/24. She reviewed all her medication bottles with me over the phone. Confirmed that she was receiving Ozempic  at the SNF and instructed her to resume. Communicated with PCP to consider restarting SGLT2i at f/u, if s/sx of UTI were resolved. She was seen by Dr. Charmayne on 06/05/24. Weight was stable, appeared euvolemic. Given soft BP (102/66 mmHg), did not restart SGLT2i. A1C had  increased to 8.6%, and Scr was elevated to 1.88 mg/dL in the setting of taking torsemide  daily. She was seen again by Dr. Charmayne on 06/19/24. Scr had improved to 1.34 mg/dL with PRN use of torsemide . BP was 144/81 mmHg. She was wearing CGM and TIR was 40%, TAR was 60%. She was instructed to increase Ozempic  to 2 mg weekly. At pharmacy call on 07/04/24, patient reported breathing was improved but BG were elevated to 300s in the setting of recent prednisone . She had not yet increased to Ozempic  2 mg weekly, but was instructed to do so when she finished her 1 mg pen.  Last seen by Dr. Elicia on 09/30/24, following admission at [facility] from 09/11/24 to 09/14/24 for HFrEF exacerbation + COPD. A1C improved to 8.2% from 8.6%. She did not have her Dexcom receiver with her. Farxiga  10 mg daily was restarted for HF and T2DM. Patient was encouraged to refill atorvastatin  80 mg daily. She was admitted for HF in the setting of 2 days of nausea/vomiting at [facility] from 10/30/24 to 11/01/24. Farxiga  was paused at discharge due to soft BP and eGFR < 30 mL/min while admitted.     Care Team: Primary Care Provider: [name], MD ; Needs to be scheduled- declined scheduling today since she was in bed  Cardiologist: [name], PA; Next Scheduled Visit: 08/22/24  Medication Access/Adherence  Current Pharmacy:  [id number] [place], [place] - [address] AT [facility] [address] [place] [address] Phone: [phone] Fax: [phone]   Patient reports affordability  concerns with their medications: No  Patient reports access/transportation concerns to their pharmacy: No  - she picks up her own medications from [facility] Patient reports adherence concerns with their medications:  Yes  - frequently forgets atorvastatin  (has excess supply). States she has excess supply of Anoro Ellipta  but says she is taking daily.  Heart Failure (EF 20-25%):  Current medications:  ACEi/ARB/ARNI: none SGLT2i:  Farxiga  held s/p UTI in August Beta blocker: bisoprolol  2.5 mg daily  Mineralocorticoid Receptor Antagonist: spironolactone  12.5 mg daily Diuretic regimen: torsemide  20 mg PRN (reports last dose was sometime last week)  Current home weights: Reports weights are stable  Patient denies volume overload signs or symptoms including LE swelling. She is not needing torsemide  frequently.  Current physical activity: reports she has been able to get up and walk around at home  Current medication access support: [name]  Diabetes:  Current medications: insulin  degludec (Tresiba ) 5 units daily, Ozempic  2 mg once weekly Medications tried in the past: metformin  (renal function)  [facility] since increasing Ozempic  to 2 mg weekly  Current glucose readings: Reports her Dexcom came off yesterday. Reports sugars were mostly in the green zone. She uses receiver device to monitor sugars  Patient denies hypoglycemic s/sx including dizziness, shakiness, sweating. Patient denies hyperglycemic symptoms including polyuria, polydipsia, polyphagia, nocturia, neuropathy, blurred vision.  Current meal patterns: Usually eating baked foods, chicken sandwich, salad.  - Drinks: Drinking water throughout the day, 3-4 bottles of water per day. Reports that she has juice 1-2x per day. Reports that she dilutes her juice with water.   Hyperlipidemia/ASCVD Risk Reduction  Current lipid lowering medications: atorvastatin  80 mg daily (reports she frequently forgets doses because it is written for her to take at bedtime)  COPD:  Current medications: Anoro ellipta  (umeclidinium-vilaternol) 62.5-25 mcg/act 1 puff daily  Atrial Flutter:  Current medications:  Rate Control: bisoprolol  2.5 mg daily Anticoagulation Regimen: Apixaban  5 mg BID   Objective:  BP Readings from Last 3 Encounters:  11/01/24 125/89  09/30/24 134/79  09/14/24 132/75    Lab Results  Component Value Date   HGBA1C 8.2 (A)  09/30/2024   HGBA1C 8.6 (H) 06/19/2024   HGBA1C 7.5 (A) 03/25/2024       Latest Ref Rng & Units 11/01/2024    2:37 AM 10/31/2024    2:26 AM 10/30/2024   10:21 PM  BMP  Glucose 70 - 99 mg/dL 839  97  886   BUN 8 - 23 mg/dL 52  50  51   Creatinine 0.44 - 1.00 mg/dL 8.09  8.05  7.98   Sodium 135 - 145 mmol/L 142  144  144   Potassium 3.5 - 5.1 mmol/L 3.6  4.3  4.4   Chloride 98 - 111 mmol/L 102  104  102   CO2 22 - 32 mmol/L 25  25  23    Calcium  8.9 - 10.3 mg/dL 8.9  9.3  9.0     Lab Results  Component Value Date   CHOL 194 12/28/2023   HDL 59 12/28/2023   LDLCALC 122 (H) 12/28/2023   LDLDIRECT 122.3 03/06/2007   TRIG 66 12/28/2023   CHOLHDL 3.3 12/28/2023    Medications Reviewed Today   Medications were not reviewed in this encounter       Assessment/Plan:   Heart Failure (EF 20-25%): - Currently appropriately managed, with room for optimization. Given severely reduced EF, would prioritize adding back SGLT2i to prevent HF hospitalization and mortality at next  PCP appointment as long as patient does not have s/sx of an active UTI and glycemic control is improved. - Reviewed appropriate blood pressure monitoring technique and reviewed goal blood pressure - Reviewed to weigh daily and when to contact cardiology with weight gain - Reviewed dietary modifications including limiting fluid intake to no more than 4 bottles of water per day.  - Recommend to continue current regimen: Current medications:  ACEi/ARB/ARNI: none SGLT2i: Farxiga  currently being held s/p UTI in Aug 2025- consider restarting at follow-up Beta blocker: bisoprolol  2.5 mg daily  Mineralocorticoid Receptor Antagonist: spironolactone  12.5 mg daily  Diuretic regimen: torsemide  20 mg PRN fluid/weight gain   Diabetes: - Currently uncontrolled with most recent A1C of 8.6% above goal <7%, and worsened from 7.5%. She is tolerating max-dose Ozempic  well. Encouraged continued use of Dexcom. - Reviewed long term  cardiovascular and renal outcomes of uncontrolled blood sugar - Reviewed goal A1c, goal fasting, and goal 2 hour post prandial glucose - Reviewed hypoglycemia management plan and the rule of 15 - Reviewed dietary modifications including  utilizing the healthy plate method, limiting portion size of carbohydrate foods, increasing intake of protein and non-starchy vegetables.  - Recommend to continue Ozempic  2 mg weekly  - Recommend to continue Tresiba  5 units daily - If she is able to restart Farxiga  at follow-up, this may also help achieve A1C goal  - Recommend to check glucose continuously with Dexcom G7 CGM and reader. - Next A1C due 09/18/24   Hyperlipidemia/ASCVD Risk Reduction: - Currently uncontrolled with most recent LDL-C of 122 mg/dL above goal < 70 mg/dL given U7IF + comorbidities. High intensity statin indicated. Advised patient she could take atorvastatin  in the morning or evening with her other medications if it is difficult to remember at bedtime - Reviewed long term complications of uncontrolled cholesterol - Recommend to continue atorvastatin  80 mg daily    Patient confirmed adherence to Anoro Ellipta  (COPD) and apixaban  5 mg BID (Afib)   Patient verbalized understanding of treatment plan.    Follow Up Plan:  Pharmacist telephone 09/19/24 PCP clinic visit needs to be scheduled - patient stated she would call back [facility] to schedule when she has her calendar   Lorain Baseman, PharmD [facility] [phone]  "

## 2024-11-07 NOTE — Telephone Encounter (Signed)
 Contacted patient for telephone appointment scheduled at [facility] today. Patient reports she is unable to complete appt today. Confirmed that she will be attending HFU on 11/12/24 at 9:45AM. Advised her to bring all medications with her to her appointment. She confirmed that she is wearing a Dexcom sensor. Advised to also bring her receiver with her.   Pharmacy telephone follow-up scheduled for ~1 mo.   Lorain Baseman, PharmD [facility] [phone]

## 2024-11-08 ENCOUNTER — Ambulatory Visit: Payer: Self-pay

## 2024-11-12 ENCOUNTER — Inpatient Hospital Stay: Payer: Self-pay | Admitting: Student

## 2024-11-13 ENCOUNTER — Telehealth

## 2024-12-05 ENCOUNTER — Other Ambulatory Visit

## 2024-12-06 ENCOUNTER — Ambulatory Visit: Payer: Self-pay | Admitting: Student

## 2024-12-20 ENCOUNTER — Ambulatory Visit: Admitting: Student

## 2025-01-09 ENCOUNTER — Ambulatory Visit
